# Patient Record
Sex: Female | Born: 1970
Health system: Southern US, Community
[De-identification: ages and names within clinical notes are randomized; demographics above are authoritative.]

## PROBLEM LIST (undated history)

## (undated) DIAGNOSIS — F4310 Post-traumatic stress disorder, unspecified: Secondary | ICD-10-CM

## (undated) DIAGNOSIS — E1110 Type 2 diabetes mellitus with ketoacidosis without coma: Secondary | ICD-10-CM

## (undated) DIAGNOSIS — E669 Obesity, unspecified: Secondary | ICD-10-CM

## (undated) DIAGNOSIS — O24919 Unspecified diabetes mellitus in pregnancy, unspecified trimester: Secondary | ICD-10-CM

## (undated) DIAGNOSIS — R51 Headache: Secondary | ICD-10-CM

## (undated) DIAGNOSIS — N643 Galactorrhea not associated with childbirth: Secondary | ICD-10-CM

## (undated) DIAGNOSIS — S90425A Blister (nonthermal), left lesser toe(s), initial encounter: Secondary | ICD-10-CM

## (undated) DIAGNOSIS — Z8619 Personal history of other infectious and parasitic diseases: Secondary | ICD-10-CM

## (undated) DIAGNOSIS — F329 Major depressive disorder, single episode, unspecified: Secondary | ICD-10-CM

## (undated) DIAGNOSIS — F319 Bipolar disorder, unspecified: Secondary | ICD-10-CM

## (undated) DIAGNOSIS — N926 Irregular menstruation, unspecified: Secondary | ICD-10-CM

## (undated) DIAGNOSIS — F32A Depression, unspecified: Secondary | ICD-10-CM

## (undated) DIAGNOSIS — M1990 Unspecified osteoarthritis, unspecified site: Secondary | ICD-10-CM

## (undated) DIAGNOSIS — B009 Herpesviral infection, unspecified: Secondary | ICD-10-CM

## (undated) DIAGNOSIS — Z87898 Personal history of other specified conditions: Secondary | ICD-10-CM

## (undated) DIAGNOSIS — IMO0002 Reserved for concepts with insufficient information to code with codable children: Secondary | ICD-10-CM

## (undated) DIAGNOSIS — B379 Candidiasis, unspecified: Secondary | ICD-10-CM

## (undated) DIAGNOSIS — Z950 Presence of cardiac pacemaker: Secondary | ICD-10-CM

## (undated) DIAGNOSIS — Z8742 Personal history of other diseases of the female genital tract: Secondary | ICD-10-CM

## (undated) DIAGNOSIS — N921 Excessive and frequent menstruation with irregular cycle: Secondary | ICD-10-CM

## (undated) DIAGNOSIS — Z87440 Personal history of urinary (tract) infections: Secondary | ICD-10-CM

## (undated) DIAGNOSIS — N915 Oligomenorrhea, unspecified: Secondary | ICD-10-CM

## (undated) DIAGNOSIS — A499 Bacterial infection, unspecified: Secondary | ICD-10-CM

## (undated) DIAGNOSIS — K219 Gastro-esophageal reflux disease without esophagitis: Secondary | ICD-10-CM

## (undated) DIAGNOSIS — A599 Trichomoniasis, unspecified: Secondary | ICD-10-CM

## (undated) DIAGNOSIS — D219 Benign neoplasm of connective and other soft tissue, unspecified: Secondary | ICD-10-CM

## (undated) DIAGNOSIS — R102 Pelvic and perineal pain: Secondary | ICD-10-CM

## (undated) DIAGNOSIS — N939 Abnormal uterine and vaginal bleeding, unspecified: Secondary | ICD-10-CM

## (undated) DIAGNOSIS — B373 Candidiasis of vulva and vagina: Secondary | ICD-10-CM

## (undated) DIAGNOSIS — R638 Other symptoms and signs concerning food and fluid intake: Secondary | ICD-10-CM

## (undated) DIAGNOSIS — T385X5A Adverse effect of other estrogens and progestogens, initial encounter: Secondary | ICD-10-CM

## (undated) DIAGNOSIS — I10 Essential (primary) hypertension: Secondary | ICD-10-CM

## (undated) HISTORY — DX: Herpesviral infection, unspecified: B00.9

## (undated) HISTORY — DX: Post-traumatic stress disorder, unspecified: F43.10

## (undated) HISTORY — DX: Candidiasis, unspecified: B37.9

## (undated) HISTORY — DX: Personal history of other specified conditions: Z87.898

## (undated) HISTORY — DX: Obesity, unspecified: E66.9

## (undated) HISTORY — DX: Trichomoniasis, unspecified: A59.9

## (undated) HISTORY — DX: Type 2 diabetes mellitus with ketoacidosis without coma: E11.10

## (undated) HISTORY — DX: Bacterial infection, unspecified: A49.9

## (undated) HISTORY — DX: Candidiasis of vulva and vagina: B37.3

## (undated) HISTORY — DX: Excessive and frequent menstruation with irregular cycle: N92.1

## (undated) HISTORY — DX: Oligomenorrhea, unspecified: N91.5

## (undated) HISTORY — DX: Personal history of other diseases of the female genital tract: Z87.42

## (undated) HISTORY — DX: Abnormal uterine and vaginal bleeding, unspecified: N93.9

## (undated) HISTORY — DX: Personal history of urinary (tract) infections: Z87.440

## (undated) HISTORY — DX: Personal history of other infectious and parasitic diseases: Z86.19

## (undated) HISTORY — DX: Pelvic and perineal pain: R10.2

## (undated) HISTORY — DX: Adverse effect of other estrogens and progestogens, initial encounter: T38.5X5A

## (undated) HISTORY — DX: Reserved for concepts with insufficient information to code with codable children: IMO0002

## (undated) HISTORY — DX: Other symptoms and signs concerning food and fluid intake: R63.8

## (undated) HISTORY — DX: Galactorrhea not associated with childbirth: N64.3

---
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
## 1996-12-06 DIAGNOSIS — IMO0002 Reserved for concepts with insufficient information to code with codable children: Secondary | ICD-10-CM

## 1996-12-06 DIAGNOSIS — R87619 Unspecified abnormal cytological findings in specimens from cervix uteri: Secondary | ICD-10-CM

## 1996-12-06 HISTORY — DX: Reserved for concepts with insufficient information to code with codable children: IMO0002

## 1996-12-06 HISTORY — DX: Unspecified abnormal cytological findings in specimens from cervix uteri: R87.619

## 2004-02-11 ENCOUNTER — Emergency Department (HOSPITAL_COMMUNITY): Admission: EM | Admit: 2004-02-11 | Discharge: 2004-02-12 | Payer: Self-pay

## 2004-06-26 ENCOUNTER — Encounter: Admission: RE | Admit: 2004-06-26 | Discharge: 2004-06-26 | Payer: Self-pay | Admitting: Family Medicine

## 2005-05-26 ENCOUNTER — Encounter: Admission: RE | Admit: 2005-05-26 | Discharge: 2005-08-24 | Payer: Self-pay | Admitting: Internal Medicine

## 2005-09-07 ENCOUNTER — Encounter (HOSPITAL_COMMUNITY): Admission: RE | Admit: 2005-09-07 | Discharge: 2005-10-07 | Payer: Self-pay | Admitting: Obstetrics

## 2005-10-12 ENCOUNTER — Encounter (HOSPITAL_COMMUNITY): Admission: RE | Admit: 2005-10-12 | Discharge: 2005-11-11 | Payer: Self-pay | Admitting: Obstetrics

## 2005-11-16 ENCOUNTER — Encounter (HOSPITAL_COMMUNITY): Admission: RE | Admit: 2005-11-16 | Discharge: 2005-11-23 | Payer: Self-pay | Admitting: Obstetrics

## 2005-11-19 ENCOUNTER — Inpatient Hospital Stay (HOSPITAL_COMMUNITY): Admission: AD | Admit: 2005-11-19 | Discharge: 2005-11-19 | Payer: Self-pay | Admitting: Obstetrics

## 2005-11-20 ENCOUNTER — Inpatient Hospital Stay (HOSPITAL_COMMUNITY): Admission: AD | Admit: 2005-11-20 | Discharge: 2005-11-20 | Payer: Self-pay | Admitting: Obstetrics

## 2005-11-28 ENCOUNTER — Inpatient Hospital Stay (HOSPITAL_COMMUNITY): Admission: AD | Admit: 2005-11-28 | Discharge: 2005-11-30 | Payer: Self-pay | Admitting: Obstetrics

## 2005-12-07 ENCOUNTER — Encounter (HOSPITAL_COMMUNITY): Admission: RE | Admit: 2005-12-07 | Discharge: 2005-12-07 | Payer: Self-pay | Admitting: Obstetrics

## 2005-12-10 ENCOUNTER — Inpatient Hospital Stay (HOSPITAL_COMMUNITY): Admission: AD | Admit: 2005-12-10 | Discharge: 2005-12-13 | Payer: Self-pay | Admitting: Obstetrics

## 2005-12-11 ENCOUNTER — Encounter (INDEPENDENT_AMBULATORY_CARE_PROVIDER_SITE_OTHER): Payer: Self-pay | Admitting: Specialist

## 2006-09-24 ENCOUNTER — Emergency Department (HOSPITAL_COMMUNITY): Admission: EM | Admit: 2006-09-24 | Discharge: 2006-09-24 | Payer: Self-pay | Admitting: Emergency Medicine

## 2006-12-06 DIAGNOSIS — N643 Galactorrhea not associated with childbirth: Secondary | ICD-10-CM

## 2006-12-06 HISTORY — DX: Galactorrhea not associated with childbirth: N64.3

## 2007-02-02 DIAGNOSIS — E119 Type 2 diabetes mellitus without complications: Secondary | ICD-10-CM | POA: Insufficient documentation

## 2007-02-02 DIAGNOSIS — I10 Essential (primary) hypertension: Secondary | ICD-10-CM | POA: Insufficient documentation

## 2007-02-02 DIAGNOSIS — E785 Hyperlipidemia, unspecified: Secondary | ICD-10-CM

## 2007-02-02 DIAGNOSIS — F319 Bipolar disorder, unspecified: Secondary | ICD-10-CM | POA: Insufficient documentation

## 2007-04-21 ENCOUNTER — Emergency Department (HOSPITAL_COMMUNITY): Admission: EM | Admit: 2007-04-21 | Discharge: 2007-04-21 | Payer: Self-pay | Admitting: Family Medicine

## 2007-04-28 ENCOUNTER — Emergency Department (HOSPITAL_COMMUNITY): Admission: EM | Admit: 2007-04-28 | Discharge: 2007-04-28 | Payer: Self-pay | Admitting: Emergency Medicine

## 2007-05-19 ENCOUNTER — Emergency Department (HOSPITAL_COMMUNITY): Admission: EM | Admit: 2007-05-19 | Discharge: 2007-05-19 | Payer: Self-pay | Admitting: Emergency Medicine

## 2007-07-07 DIAGNOSIS — Z8742 Personal history of other diseases of the female genital tract: Secondary | ICD-10-CM

## 2007-07-07 DIAGNOSIS — B3731 Acute candidiasis of vulva and vagina: Secondary | ICD-10-CM

## 2007-07-07 DIAGNOSIS — B373 Candidiasis of vulva and vagina: Secondary | ICD-10-CM

## 2007-07-07 DIAGNOSIS — N915 Oligomenorrhea, unspecified: Secondary | ICD-10-CM

## 2007-07-07 HISTORY — DX: Candidiasis of vulva and vagina: B37.3

## 2007-07-07 HISTORY — DX: Acute candidiasis of vulva and vagina: B37.31

## 2007-07-07 HISTORY — DX: Oligomenorrhea, unspecified: N91.5

## 2007-07-07 HISTORY — DX: Personal history of other diseases of the female genital tract: Z87.42

## 2007-07-13 ENCOUNTER — Encounter: Admission: RE | Admit: 2007-07-13 | Discharge: 2007-07-13 | Payer: Self-pay | Admitting: Obstetrics and Gynecology

## 2007-10-07 ENCOUNTER — Emergency Department (HOSPITAL_COMMUNITY): Admission: EM | Admit: 2007-10-07 | Discharge: 2007-10-07 | Payer: Self-pay | Admitting: Emergency Medicine

## 2007-12-07 DIAGNOSIS — Z87440 Personal history of urinary (tract) infections: Secondary | ICD-10-CM

## 2007-12-07 HISTORY — DX: Personal history of urinary (tract) infections: Z87.440

## 2008-03-18 ENCOUNTER — Encounter: Admission: RE | Admit: 2008-03-18 | Discharge: 2008-06-16 | Payer: Self-pay | Admitting: Internal Medicine

## 2008-07-03 ENCOUNTER — Emergency Department (HOSPITAL_COMMUNITY): Admission: EM | Admit: 2008-07-03 | Discharge: 2008-07-03 | Payer: Self-pay | Admitting: Emergency Medicine

## 2008-08-18 ENCOUNTER — Emergency Department (HOSPITAL_COMMUNITY): Admission: EM | Admit: 2008-08-18 | Discharge: 2008-08-18 | Payer: Self-pay | Admitting: Family Medicine

## 2008-10-21 ENCOUNTER — Encounter: Admission: RE | Admit: 2008-10-21 | Discharge: 2008-10-21 | Payer: Self-pay | Admitting: Internal Medicine

## 2008-12-06 DIAGNOSIS — Z8742 Personal history of other diseases of the female genital tract: Secondary | ICD-10-CM

## 2008-12-06 DIAGNOSIS — R638 Other symptoms and signs concerning food and fluid intake: Secondary | ICD-10-CM

## 2008-12-06 HISTORY — DX: Personal history of other diseases of the female genital tract: Z87.42

## 2008-12-06 HISTORY — DX: Other symptoms and signs concerning food and fluid intake: R63.8

## 2009-01-03 DIAGNOSIS — B009 Herpesviral infection, unspecified: Secondary | ICD-10-CM

## 2009-01-03 HISTORY — DX: Herpesviral infection, unspecified: B00.9

## 2009-01-14 ENCOUNTER — Observation Stay (HOSPITAL_COMMUNITY): Admission: AD | Admit: 2009-01-14 | Discharge: 2009-01-15 | Payer: Self-pay | Admitting: Obstetrics and Gynecology

## 2009-06-24 ENCOUNTER — Inpatient Hospital Stay (HOSPITAL_COMMUNITY): Admission: AD | Admit: 2009-06-24 | Discharge: 2009-06-26 | Payer: Self-pay | Admitting: Obstetrics and Gynecology

## 2009-06-30 ENCOUNTER — Inpatient Hospital Stay (HOSPITAL_COMMUNITY): Admission: AD | Admit: 2009-06-30 | Discharge: 2009-07-01 | Payer: Self-pay | Admitting: Obstetrics and Gynecology

## 2009-07-05 ENCOUNTER — Encounter (INDEPENDENT_AMBULATORY_CARE_PROVIDER_SITE_OTHER): Payer: Self-pay | Admitting: Obstetrics and Gynecology

## 2009-07-05 ENCOUNTER — Inpatient Hospital Stay (HOSPITAL_COMMUNITY): Admission: AD | Admit: 2009-07-05 | Discharge: 2009-07-07 | Payer: Self-pay | Admitting: Obstetrics and Gynecology

## 2010-02-10 ENCOUNTER — Encounter: Admission: RE | Admit: 2010-02-10 | Discharge: 2010-02-10 | Payer: Self-pay | Admitting: Obstetrics and Gynecology

## 2010-09-01 ENCOUNTER — Emergency Department (HOSPITAL_COMMUNITY)
Admission: EM | Admit: 2010-09-01 | Discharge: 2010-09-01 | Payer: Self-pay | Source: Home / Self Care | Admitting: Emergency Medicine

## 2010-11-20 ENCOUNTER — Inpatient Hospital Stay (HOSPITAL_COMMUNITY)
Admission: AD | Admit: 2010-11-20 | Discharge: 2010-11-24 | Payer: Self-pay | Source: Home / Self Care | Attending: Obstetrics and Gynecology | Admitting: Obstetrics and Gynecology

## 2010-12-06 NOTE — L&D Delivery Note (Signed)
Delivery Note At 1:31 PM a viable female was delivered via Vaginal, Spontaneous Delivery (Presentation: ; Occiput Anterior).  APGAR: 9, 9; weight 8 lb 9 oz (3885 g).  Right shoulder dystocia reduced with supra pubic pressure and Lysle Dingwall position.   Placenta status: Intact, Spontaneous.  Cord: 3 vessels with the following complications: .  Cord pH: not sent  Anesthesia: Local  Episiotomy: None Lacerations: second degree perineal Suture Repair: 2.0 chromic Est. Blood Loss (mL): 350cc  Mom to postpartum.  Baby to nursery-stable.  Baer Hinton A 07/21/2011, 2:10 PM

## 2011-01-05 NOTE — Discharge Summary (Signed)
Sarah, [name]                  ACCOUNT NO.:  [id number]  MEDICAL RECORD NO.:  [id number]          PATIENT TYPE:  INP  LOCATION:  9305                          [facility]  PHYSICIAN:  Sarah Long, M.D.DATE OF BIRTH:  1971/10/25  DATE OF ADMISSION:  11/20/2010 DATE OF DISCHARGE:  11/24/2010                              DISCHARGE SUMMARY   ADMITTING DIAGNOSES: 1. Intrauterine pregnancy in the first trimester. 2. Type 2 diabetes with poor control and poor compliance. 3. Chronic hypertension.  No current medications. 4. Poor obstetrical history.  DISCHARGE DIAGNOSES: 1. First trimester pregnancy. 2. Insulin-dependent diabetes in poor control. 3. Chronic hypertension.  No medication required.  PROCEDURES:  Diabetic teaching including nutritional education.  HOSPITAL COURSE:  Sarah Long is a 40 year old gravida 5, para 0-3-1-3 who presented on November 20, 2010, at 5:00 a.m. with complaint of feeling very bad with dizzy and headache.  Her circumstances had been remarkable for a positive urine pregnancy test earlier in the week.   History remarkable for: 1. Type 2 diabetes, poorly controlled secondary to poor compliance. 2. Chronic hypertension with the patient off medications since last     delivery with blood pressure 150/87 in the week of admission. 3. Bipolar, not on any medication. 4. History of MRSA remotely. 5. Previous C-section x1 with VBAC x2. 6. Elevated BMI. 7. Three previous preterm deliveries.  The patient's primary, Sarah Long, had recommended her to take 25 units of Levemir and to start blood pressure medication at the patient's visit earlier that week.  The patient had not done this.  Her most recent hemoglobin A1c had been 12.7, and the patient was not checking her blood sugars at home.  In maternity admissions, GC, Chlamydia, wet prep were done.  Urinalysis showed 500 of glucose.  CBC was within normal limits.  Her pelvic exam was  essentially normal.  Her comprehensive metabolic panel was normal except for a glucose level of 370.  Her quantitative HCG was 114.  It had previously been 97 on November 18, 2010.  The patient in light of her poor control of her diabetes was admitted to the third floor for consult with Sarah Long.  She was placed on glucose stabilizer, low-carb diet, and blood sugars were beginning to be monitored.  IV insulin was implemented.  Over the next several days, the patient's diet normalized to more carbohydrate control. However, she still continued to have blood sugars that were outside normal limits, and her insulin dosages were adjusted frequently. Recommendation was for her to go to Sarah Long for a possible insulin pump and also to be referred to the [facility] secondary to her bipolar disease that was not being treated.  By the 19th she was hoping to go home.  She reported she was really trying to keep her diet appropriate.  Her vital signs at that point were in the 140s-150s over 75s-90s.  Her fasting blood sugar and 2-hour postprandials were all still outside normal limits.  She has still remained on no blood pressure medicines.  GC, Chlamydia and MRSA cultures were negative. Nutrition consult was  obtained.  By December 20, the patient was apparently doing better with her diet.  However, her blood sugars were still outside normal limits, and she was insistant on going home that day.  Blood sugars were in the 200s, both fasting and 2-hour postprandial.  The decision was made, since the patient was insisting on going home, to discharge her.  Plan was made for her to follow up with Sarah Long office and discharge instructions were given for insulin regimen.  All of the patient's blood sugar were still not within normal limits, but again, in light of the patient's insisting on being discharged, she was discharged home in generally stable condition, although her blood sugars were  outside normal limits.  DISCHARGE INSTRUCTIONS:  The patient was to maintain a modified carbohydrate diet.  She was also to notice any issues with her pregnancy in light of bleeding or pain.  She was also recommended to follow up with the regimens that had been recommended for her insulin administration as well as her dietary management.  DISCHARGE MEDICATIONS:  18 units NPH in the morning and 18 units NPH at bedtime, 12 units Humalog with each meal.  She was to monitor her blood sugars with at least fasting and 2-hour PCs.  DISCHARGE FOLLOWUP:  Sarah Long office was called and they will call the patient to schedule an appointment on December 28 or December 08, 2010.  The patient was also instructed to return to our office to see Sarah Long and to call [facility] to get set up for her bipolar management.     Sarah Long, C.N.M.   ______________________________ Sarah Long, M.D.    VLL/MEDQ  D:  12/31/2010  T:  12/31/2010  Job:  [id number]  Electronically Signed by Nigel Bridgeman C.N.M. on 01/01/2011 02:24:13 AM Electronically Signed by Kirkland Hun M.D. on 01/05/2011 11:59:20 AM

## 2011-01-13 LAB — ANTIBODY SCREEN: Antibody Screen: NEGATIVE

## 2011-02-15 LAB — GLUCOSE, CAPILLARY
Glucose-Capillary: 105 mg/dL — ABNORMAL HIGH (ref 70–99)
Glucose-Capillary: 108 mg/dL — ABNORMAL HIGH (ref 70–99)
Glucose-Capillary: 118 mg/dL — ABNORMAL HIGH (ref 70–99)
Glucose-Capillary: 124 mg/dL — ABNORMAL HIGH (ref 70–99)
Glucose-Capillary: 135 mg/dL — ABNORMAL HIGH (ref 70–99)
Glucose-Capillary: 142 mg/dL — ABNORMAL HIGH (ref 70–99)
Glucose-Capillary: 180 mg/dL — ABNORMAL HIGH (ref 70–99)
Glucose-Capillary: 184 mg/dL — ABNORMAL HIGH (ref 70–99)
Glucose-Capillary: 186 mg/dL — ABNORMAL HIGH (ref 70–99)
Glucose-Capillary: 212 mg/dL — ABNORMAL HIGH (ref 70–99)
Glucose-Capillary: 225 mg/dL — ABNORMAL HIGH (ref 70–99)
Glucose-Capillary: 230 mg/dL — ABNORMAL HIGH (ref 70–99)
Glucose-Capillary: 234 mg/dL — ABNORMAL HIGH (ref 70–99)
Glucose-Capillary: 239 mg/dL — ABNORMAL HIGH (ref 70–99)
Glucose-Capillary: 248 mg/dL — ABNORMAL HIGH (ref 70–99)
Glucose-Capillary: 279 mg/dL — ABNORMAL HIGH (ref 70–99)
Glucose-Capillary: 279 mg/dL — ABNORMAL HIGH (ref 70–99)
Glucose-Capillary: 306 mg/dL — ABNORMAL HIGH (ref 70–99)
Glucose-Capillary: 94 mg/dL (ref 70–99)

## 2011-02-15 LAB — URINALYSIS, ROUTINE W REFLEX MICROSCOPIC
Bilirubin Urine: NEGATIVE
Ketones, ur: NEGATIVE mg/dL
Nitrite: NEGATIVE

## 2011-02-15 LAB — CBC
Hemoglobin: 12.9 g/dL (ref 12.0–15.0)
MCH: 29.7 pg (ref 26.0–34.0)
MCHC: 34.4 g/dL (ref 30.0–36.0)
RBC: 4.35 MIL/uL (ref 3.87–5.11)
RDW: 13.6 % (ref 11.5–15.5)

## 2011-02-15 LAB — COMPREHENSIVE METABOLIC PANEL
ALT: 27 U/L (ref 0–35)
Albumin: 3.7 g/dL (ref 3.5–5.2)
Alkaline Phosphatase: 64 U/L (ref 39–117)
BUN: 8 mg/dL (ref 6–23)
CO2: 22 mEq/L (ref 19–32)
Calcium: 9.5 mg/dL (ref 8.4–10.5)
GFR calc Af Amer: >60 mL/min (ref >60)
Potassium: 4 mEq/L (ref 3.5–5.1)

## 2011-02-15 LAB — WET PREP, GENITAL
Clue Cells Wet Prep HPF POC: NONE SEEN
Trich, Wet Prep: NONE SEEN

## 2011-02-15 LAB — HCG, QUANTITATIVE, PREGNANCY
hCG, Beta Chain, Quant, S: 144 m[IU]/mL — ABNORMAL HIGH (ref <5)
hCG, Beta Chain, Quant, S: 314 m[IU]/mL — ABNORMAL HIGH (ref <5)

## 2011-02-15 LAB — GC/CHLAMYDIA PROBE AMP, GENITAL: GC Probe Amp, Genital: NEGATIVE

## 2011-02-15 LAB — DIFFERENTIAL
Basophils Absolute: 0 10*3/uL (ref 0.0–0.1)
Eosinophils Absolute: 0.1 10*3/uL (ref 0.0–0.7)
Eosinophils Relative: 2 % (ref 0–5)
Neutro Abs: 4 10*3/uL (ref 1.7–7.7)

## 2011-02-15 LAB — LACTATE DEHYDROGENASE: LDH: 119 U/L (ref 94–250)

## 2011-02-15 LAB — URIC ACID: Uric Acid, Serum: 4.2 mg/dL (ref 2.4–7.0)

## 2011-02-18 LAB — POCT PREGNANCY, URINE: Preg Test, Ur: NEGATIVE

## 2011-02-25 ENCOUNTER — Other Ambulatory Visit (HOSPITAL_COMMUNITY): Payer: Self-pay | Admitting: Obstetrics and Gynecology

## 2011-02-25 DIAGNOSIS — O24919 Unspecified diabetes mellitus in pregnancy, unspecified trimester: Secondary | ICD-10-CM

## 2011-02-25 DIAGNOSIS — Z0489 Encounter for examination and observation for other specified reasons: Secondary | ICD-10-CM

## 2011-03-04 ENCOUNTER — Other Ambulatory Visit (HOSPITAL_COMMUNITY): Payer: Self-pay | Admitting: Obstetrics and Gynecology

## 2011-03-04 ENCOUNTER — Ambulatory Visit (HOSPITAL_COMMUNITY)
Admission: RE | Admit: 2011-03-04 | Discharge: 2011-03-04 | Disposition: A | Discharge status: Home / Self Care | Payer: Medicare Other | Source: Ambulatory Visit | Attending: Obstetrics and Gynecology | Admitting: Obstetrics and Gynecology

## 2011-03-04 DIAGNOSIS — O09529 Supervision of elderly multigravida, unspecified trimester: Secondary | ICD-10-CM

## 2011-03-04 DIAGNOSIS — R772 Abnormality of alphafetoprotein: Secondary | ICD-10-CM

## 2011-03-04 DIAGNOSIS — Z363 Encounter for antenatal screening for malformations: Secondary | ICD-10-CM | POA: Insufficient documentation

## 2011-03-04 DIAGNOSIS — O24919 Unspecified diabetes mellitus in pregnancy, unspecified trimester: Secondary | ICD-10-CM | POA: Insufficient documentation

## 2011-03-04 DIAGNOSIS — Z0489 Encounter for examination and observation for other specified reasons: Secondary | ICD-10-CM

## 2011-03-04 DIAGNOSIS — Z8751 Personal history of pre-term labor: Secondary | ICD-10-CM | POA: Insufficient documentation

## 2011-03-04 DIAGNOSIS — O34219 Maternal care for unspecified type scar from previous cesarean delivery: Secondary | ICD-10-CM | POA: Insufficient documentation

## 2011-03-04 DIAGNOSIS — Z1389 Encounter for screening for other disorder: Secondary | ICD-10-CM | POA: Insufficient documentation

## 2011-03-04 DIAGNOSIS — O358XX0 Maternal care for other (suspected) fetal abnormality and damage, not applicable or unspecified: Secondary | ICD-10-CM | POA: Insufficient documentation

## 2011-03-08 ENCOUNTER — Ambulatory Visit (HOSPITAL_COMMUNITY)
Admission: RE | Admit: 2011-03-08 | Discharge: 2011-03-08 | Disposition: A | Discharge status: Home / Self Care | Payer: Medicare Other | Source: Ambulatory Visit | Attending: Obstetrics and Gynecology | Admitting: Obstetrics and Gynecology

## 2011-03-08 ENCOUNTER — Other Ambulatory Visit (HOSPITAL_COMMUNITY): Payer: Self-pay | Admitting: Obstetrics and Gynecology

## 2011-03-08 DIAGNOSIS — O24919 Unspecified diabetes mellitus in pregnancy, unspecified trimester: Secondary | ICD-10-CM | POA: Insufficient documentation

## 2011-03-08 DIAGNOSIS — O09529 Supervision of elderly multigravida, unspecified trimester: Secondary | ICD-10-CM

## 2011-03-08 DIAGNOSIS — O34219 Maternal care for unspecified type scar from previous cesarean delivery: Secondary | ICD-10-CM | POA: Insufficient documentation

## 2011-03-08 DIAGNOSIS — R772 Abnormality of alphafetoprotein: Secondary | ICD-10-CM

## 2011-03-08 DIAGNOSIS — Z8751 Personal history of pre-term labor: Secondary | ICD-10-CM | POA: Insufficient documentation

## 2011-03-13 LAB — GLUCOSE, CAPILLARY
Glucose-Capillary: 117 mg/dL — ABNORMAL HIGH (ref 70–99)
Glucose-Capillary: 137 mg/dL — ABNORMAL HIGH (ref 70–99)
Glucose-Capillary: 144 mg/dL — ABNORMAL HIGH (ref 70–99)
Glucose-Capillary: 99 mg/dL (ref 70–99)

## 2011-03-13 LAB — CBC
MCHC: 34 g/dL (ref 30.0–36.0)
Platelets: 210 10*3/uL (ref 150–400)
RDW: 14.9 % (ref 11.5–15.5)

## 2011-03-14 LAB — GLUCOSE, CAPILLARY
Glucose-Capillary: 114 mg/dL — ABNORMAL HIGH (ref 70–99)
Glucose-Capillary: 121 mg/dL — ABNORMAL HIGH (ref 70–99)
Glucose-Capillary: 143 mg/dL — ABNORMAL HIGH (ref 70–99)
Glucose-Capillary: 147 mg/dL — ABNORMAL HIGH (ref 70–99)
Glucose-Capillary: 147 mg/dL — ABNORMAL HIGH (ref 70–99)
Glucose-Capillary: 154 mg/dL — ABNORMAL HIGH (ref 70–99)
Glucose-Capillary: 170 mg/dL — ABNORMAL HIGH (ref 70–99)
Glucose-Capillary: 170 mg/dL — ABNORMAL HIGH (ref 70–99)
Glucose-Capillary: 74 mg/dL (ref 70–99)
Glucose-Capillary: 81 mg/dL (ref 70–99)
Glucose-Capillary: 88 mg/dL (ref 70–99)
Glucose-Capillary: 92 mg/dL (ref 70–99)

## 2011-03-14 LAB — CBC
HCT: 31 % — ABNORMAL LOW (ref 36.0–46.0)
Hemoglobin: 10.6 g/dL — ABNORMAL LOW (ref 12.0–15.0)
MCHC: 34.2 g/dL (ref 30.0–36.0)
MCV: 88.2 fL (ref 78.0–100.0)
RBC: 3.52 MIL/uL — ABNORMAL LOW (ref 3.87–5.11)
RDW: 14.4 % (ref 11.5–15.5)

## 2011-03-14 LAB — STREP B DNA PROBE: Strep Group B Ag: NEGATIVE

## 2011-03-23 LAB — GLUCOSE, CAPILLARY
Glucose-Capillary: 158 mg/dL — ABNORMAL HIGH (ref 70–99)
Glucose-Capillary: 195 mg/dL — ABNORMAL HIGH (ref 70–99)
Glucose-Capillary: 236 mg/dL — ABNORMAL HIGH (ref 70–99)

## 2011-03-23 LAB — HEMOGLOBIN A1C: Mean Plasma Glucose: 229 mg/dL

## 2011-04-01 ENCOUNTER — Ambulatory Visit (HOSPITAL_COMMUNITY): Payer: Medicare Other

## 2011-04-09 ENCOUNTER — Other Ambulatory Visit (HOSPITAL_COMMUNITY): Payer: Self-pay | Admitting: Obstetrics and Gynecology

## 2011-04-09 ENCOUNTER — Ambulatory Visit (HOSPITAL_COMMUNITY)
Admission: RE | Admit: 2011-04-09 | Discharge: 2011-04-09 | Disposition: A | Discharge status: Home / Self Care | Payer: Medicare Other | Source: Ambulatory Visit | Attending: Obstetrics and Gynecology | Admitting: Obstetrics and Gynecology

## 2011-04-09 DIAGNOSIS — O24919 Unspecified diabetes mellitus in pregnancy, unspecified trimester: Secondary | ICD-10-CM | POA: Insufficient documentation

## 2011-04-09 DIAGNOSIS — Z8751 Personal history of pre-term labor: Secondary | ICD-10-CM | POA: Insufficient documentation

## 2011-04-09 DIAGNOSIS — R772 Abnormality of alphafetoprotein: Secondary | ICD-10-CM

## 2011-04-09 DIAGNOSIS — O09529 Supervision of elderly multigravida, unspecified trimester: Secondary | ICD-10-CM

## 2011-04-09 DIAGNOSIS — O34219 Maternal care for unspecified type scar from previous cesarean delivery: Secondary | ICD-10-CM | POA: Insufficient documentation

## 2011-04-18 ENCOUNTER — Inpatient Hospital Stay (HOSPITAL_COMMUNITY)
Admission: AD | Admit: 2011-04-18 | Discharge: 2011-04-18 | Disposition: A | Discharge status: Home / Self Care | Payer: Medicare Other | Source: Ambulatory Visit | Attending: Obstetrics and Gynecology | Admitting: Obstetrics and Gynecology

## 2011-04-18 DIAGNOSIS — O4690 Antepartum hemorrhage, unspecified, unspecified trimester: Secondary | ICD-10-CM | POA: Insufficient documentation

## 2011-04-18 LAB — URINE MICROSCOPIC-ADD ON

## 2011-04-18 LAB — WET PREP, GENITAL
Clue Cells Wet Prep HPF POC: NONE SEEN
Trich, Wet Prep: NONE SEEN
Yeast Wet Prep HPF POC: NONE SEEN

## 2011-04-18 LAB — URINALYSIS, ROUTINE W REFLEX MICROSCOPIC
Specific Gravity, Urine: >1.03 — ABNORMAL HIGH (ref 1.005–1.030)
Urobilinogen, UA: 1 mg/dL (ref 0.0–1.0)

## 2011-04-18 LAB — FETAL FIBRONECTIN: Fetal Fibronectin: NEGATIVE

## 2011-04-19 LAB — GC/CHLAMYDIA PROBE AMP, GENITAL: GC Probe Amp, Genital: NEGATIVE

## 2011-04-20 NOTE — Discharge Summary (Signed)
NAME:  Sarah Long, Sarah Long                  ACCOUNT NO.:  [id number]   MEDICAL RECORD NO.:  [id number]          PATIENT TYPE:  INP   LOCATION:  9308                          [facility]   PHYSICIAN:  Dois Davenport A. Long, M.D. DATE OF BIRTH:  1971/10/15   DATE OF ADMISSION:  01/14/2009  DATE OF DISCHARGE:  01/15/2009                               DISCHARGE SUMMARY   ADMITTING DIAGNOSES:  1. Intrauterine pregnancy at 10-2/7th weeks.  2. Poorly-controlled type 2 diabetes.  3. Chronic hypertension, the patient on Procardia.  4. Recent treatment for BV.  5. Bipolar.  6. History of previous cesarean section x1, previous vaginal birth      after cesarean.  7. History of preterm delivery x2.   DISCHARGE DIAGNOSES:  1. Single intrauterine pregnancy at 10 weeks 3 days.  2. Well-controlled chronic hypertension with Procardia 30 mg XL.  3. Poorly-controlled type 2 diabetes with elevated hemoglobin A1c at      9.6.   PROCEDURES:  Revision of insulin administration regimen.   HOSPITAL COURSE:  Sarah Long is a 40 year old gravida 4, para 0-2-1-2 at  10-2/7th weeks based upon ultrasound secondary to questionable LMP, who  presented on the evening of January 14, 2009, for blood sugar control  secondary to poorly-controlled diabetes.  The patient had her new OB in  the office on the same day of admission.  At that time, she advised she  had not been taking her Levemir twice a day insulin for approximately  one month, but she was afraid it would harm the baby.  She had been  continued to take her metformin 100 mg p.o. daily and had been  previously on Tegretol, trazodone, Benicar for blood pressure control.  The patient had been instructed to consult with her primary physicians  Dr. Lelon Perla and Dr. Hortencia Pilar for her medication issues.  At that time,  she was instructed to stop the Tegretol.  She would continue the  trazodone.  She also stopped the Benicar and was placed on Procardia 30  XL once p.o.  daily.   While the patient was at her new OB visit, her blood sugar was noted to  be approximately 336.  Dr. Su Hilt was consulted and a plan was made for  her to change to  34 units of NPH with a sliding scale of regular.  The  patient's prescriptions were called for the new insulin to her pharmacy,  however, they were going to be unable to delivery until today.  The  patient checked her blood sugar at home and it was 315 at 7:00 p.m.  In  light of the inability the patient to get her insulin and get it started  today, then she was admitted for better blood sugar control.   Her pregnancy remarkable for:  1. Chronic hypertension on Procardia 30 mg XL p.o. daily.  2. Diabetes mellitus diagnosed in 2004 with the patient on long-acting      insulin and metformin at home prior to pregnancy with poor control.  3. Bipolar.  The patient previously on Tegretol and trazodone, now  just on trazodone.  4. 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  5. History of MRSA.  6. Previous C-section x1 with subsequent vaginal delivery.  7. History of preterm delivery x2, one with the first baby at 26 weeks      with a C-section for fetal distress, and number 2 at 29-5/7th weeks      with a vaginal birth after cesarean and preterm labor.  8. TAB x1.  9. Recent clindamycin therapy for baby.   PHYSICAL EXAMINATION:  On admission, blood pressure was 137/82.  The  patient was having no pain.  She was afebrile.  Other vital signs were  stable.  Her CBG was 236.  Fetal heart tones were have been noted in the  office earlier that day.  Physical exam was within normal limits.  A  plan was made to admit her to third floor for 23-hour observation for  blood sugar control.  Plan was made to start 35 units of NPH in the  evening and 13 units of NPH in the morning with a sliding scale for  postprandial blood sugar greater than 120.  The patient had a hemoglobin  A1c of 9.6.  Through the night, the  patient had a blood sugar at 11:00  p.m. of 195, at 10:35 it had been 229, and when she had the 236 that she  got 12 units of regular insulin approximately 9:45 p.m.  Through the  night, she did well.  She had no complaints.  She received an additional  23 units of regular for a sliding scale.  Her blood pressure in the  morning was 116/72.  Fasting blood sugar was 157, a.c. breakfast 196,  and p.c. breakfast 158.  Dr. Estanislado Pandy saw the patient.  The patient was  instructed on her new blood sugar regimen and a plan was made for her to  follow up on Monday with Dr. Pennie Rushing.  The patient was deemed to receive  full benefit of her hospital stay and was discharged home.   DISCHARGE INSTRUCTIONS:  The patient is to take NPH 40 units in the  morning and 20 units at bedtime.  She is to be on Humulin regular  sliding scale.  Instructions for this have been given to the patient on  the day of her office visit and the patient had a copy of this.  The  patient is to check her CBG fasting blood sugar before and after meals.  Discharge medications as previously noted NPH 4 units, 40 units in the  morning, and 20 units at bedtime.  Humulin R for sliding scale.  The  patient will also continue her other medications as previously  prescribed by her primary medical doctor's.  Discharge followup will  occur with Dr. Pennie Rushing on January 20, 2009, at 11:45 a.m.  There has  been attending appointment scheduled with Dr. Talmage Nap as the  endocrinologist through [facility].  The patient also is  aware of the need to call should she have any questions about blood  sugar management or any other issues.      Renaldo Reel Sarah Long, C.N.M.      Sarah Long, M.D.  Electronically Signed    VLL/MEDQ  D:  01/15/2009  T:  01/16/2009  Job:  [id number]
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
## 2011-04-20 NOTE — Discharge Summary (Signed)
NAMEAIRYANA, SPRUNGER                  ACCOUNT NO.:  [id number]   MEDICAL RECORD NO.:  [id number]          PATIENT TYPE:  INP   LOCATION:  9157                          FACILITY:  [facility]   PHYSICIAN:  Janine Limbo, M.D.DATE OF BIRTH:  [birth year]   DATE OF ADMISSION:  06/24/2009  DATE OF DISCHARGE:  06/26/2009                               DISCHARGE SUMMARY   ADMITTING DIAGNOSES:  1. Intrauterine pregnancy at 38 and 2/7 weeks.  2. Preterm cervical change.  3. Type 2 diabetes, insulin-dependent.  4. Chronic hypertension.  5. History of herpes simplex virus II.   DISCHARGE DIAGNOSES:  1. Intrauterine pregnancy at 38 and 2/7 weeks.  2. Preterm cervical change.  3. Type 2 diabetes, insulin-dependent.  4. Chronic hypertension.  5. History of herpes simplex virus II.   PROCEDURE:  Betamethasone course.   HOSPITAL COURSE:  Ms. Tellez is a 40 year old gravida 4, para 0-2-1-2 at  75 and 2/7 weeks, who was admitted on the afternoon of June 24, 2009  from [facility] secondary to cervical exam of 2-3, 90%  at the office.  She reported some occasional contractions.  She had a  negative fetal fibronectin done that day at noon that was negative.  She  was admitted for observation of preterm labor and betamethasone course.   Her pregnancy had been remarkable for:  1. Previous preterm delivery x2, one at 26 weeks and one at 29 weeks.      The patient currently on 17P weekly.  2. Previous cesarean section with subsequent VBAC, plan for VBAC this      time.  3. Chronic hypertension, on Aldomet 250 mg p.o. b.i.d.  4. Type 2 diabetes poorly controlled secondary to the patient in      discretions.  The patient currently on insulin.  5. Bipolar, not on any medication at present.  6. Smoker.  7. History of HSV II.  8. History of MRSA on vulva 5/10 with a negative culture on 6/10.  9. SULFA allergy.  10.Advanced maternal age with normal first trimester screen, but amnio  declined.  11.Current bacterial vaginosis, currently on Flagyl.   On arrival, the patient's vital signs were stable.  Fetal heart rate was  reactive.  There were some questionable areas of irritability on the  uterine contraction monitor.  The patient was unaware of these cervix on  exam at arrival was 3, 90%, vertex -1, slight bulging bag of water, and  capillary blood glucose was 126.  The patient not had any significant  food intake except for one smoothie.  She was placed on her regular  insulin regimen plus a sliding scale.  She was started on betamethasone  course and Procardia 10 mg p.o. q.6 p.r.n. contractions was also  ordered.  The patient had a BPP and AFI showing an AFI of 22.41 cm,  vertex presentation, and BPP of 8/8.  She was continued on her Aldomet  and she was started on Valtrex initiation for prophylaxis.  Group B  strep culture testing was also done.  By day one of her  hospital stay,  she was doing well.  She was hoping to go home.  She had completed her  betamethasone course.  She did have 1 episode of contractions during the  night, but these did not require any treatment, weight was 244.  Fetal  heart remained reactive.  Late on the evening of June 25, 2009, the  patient completed her betamethasone course.  She also had a Nutrition  consult with recommendations made for better dietary management.  Dr.  Willeen Cass was notified about the patient's admission just for their  information.   On day two hospital stay June 26, 2009, the patient was doing well.  She  was having some sporadic pelvic pressure.  Fasting blood sugars were  elevated at 147, 2-hour PCs were 154-168, other vital signs were stable.  Fetal heart rate was reactive.  Cervical exam showed the cervix to be  loose 3 cm, 80%, vertex -1 to 0 station with slight bulging bag of water  and no significant contractions noted.  Dr. Stefano Gaul was consulted and  the decision was made to complete the 48-hour for  hospitalization since  her first dose of betamethasone and then allow the patient to go home.  The patient was agreeable with this plan.  While in the hospital, the  patient was on her routine regimen of NPH 45 units in the morning,  regular 10 units in the morning, regular 15 units at lunch, regular 25  units at dinner, NPH 80 units at bedtime approximately 10 p.m.  She then  was on sliding scale of 2 units increments with scale running from 120  greater than 239.   Upon the patient's discharge, plans were made for followup and to  continue her routine medication regimen.   DISCHARGE INSTRUCTIONS:  The patient is to maintain her carbohydrate  restricted diet.  She is strongly encouraged to follow the dietary plan  to ensure that her blood sugar is under the best control it can be.  There is awareness on her part of the impact of the betamethasone course  on her blood sugars.   DISCHARGE MEDICATIONS:  1. NPH 45 units in the morning and NPH 80 units at bedtime.  Regular      units 10 units at breakfast.  Regular insulin 15 units at lunch and      regular insulin 25 units at dinner.  2. She will continue folic acid 0.4 mg daily.  3. Prenatal vitamin one p.o. daily.  4. Flagyl 5 mg p.o. b.i.d. to complete her full 7-day course.  5. Aldomet 250 mg p.o. b.i.d.  6. Zyrtec one p.o. daily.  7. Vicodin one p.o. q.6 h. p.r.n. pelvic pain.  8. Flexeril 10 mg one p.o. q.6 h. p.r.n. pain.  9. Valtrex 500 mg p.o. daily.   Discharge follow up will occur on Monday with Dr. Willeen Cass for  Endocrinology followup.  The patient also has a NST and 17P injection on  Tuesday of next week as routinely scheduled.   DISCHARGE INSTRUCTIONS:  The patient will maintain a modified bedrest  regimen.  She will notify us with any increase signs and symptoms of  pelvic pressure vaginal bleeding, leaking of fluid, decreased fetal  movement, PIH symptoms, or any advancement of or awareness of preterm   labor.      [name], C.N.M.      Janine Limbo, M.D.  Electronically Signed    VLL/MEDQ  D:  06/26/2009  T:  06/27/2009  Job:  [id number]

## 2011-04-20 NOTE — Discharge Summary (Signed)
Sarah Long                  ACCOUNT NO.:  [id number]   MEDICAL RECORD NO.:  [id number]           PATIENT TYPE:   LOCATION:                                 FACILITY:   PHYSICIAN:  Hal Morales, M.D.DATE OF BIRTH:  August 02, 1971   DATE OF ADMISSION:  DATE OF DISCHARGE:                               DISCHARGE SUMMARY   ADMITTING DIAGNOSES:  1. Advanced maternal age.  2. Chronic hypertension.  3. Previous cesarean section with subsequent vaginal birth after      cesarean section.  4. Herpes simplex virus-2.  5. Smoker.  6. History of fibroids.  7. Bipolar disease.  8. Type II diabetes   DISCHARGE SUMMARY:  Intrauterine pregnancy; delivered at 34 weeks with  spontaneous rupture of membranes; chronic hypertension; advanced  maternal age; and Type II diabetes, on insulin.   Sarah Long is a 40 year old, single, black female, gravida 4, para 0-2-1-  2 at 33 and 6/7th weeks per [facility] of August 10, 2009, who presents with  chief complaint of leaking of fluids since 3 a.m.  She was admitted on  July 05, 2009, positive fetal movement, no contractions, denies PIH, she  has not had a fever.  She was checked in the office and she was followed  by the MD Service, has remarkable for premature delivery x2, previous C-  section, 1 successful VBAC, advanced maternal age, diabetes mellitus,  chronic hypertension, and history of fibroids.  On admission, her fetal  heart rate was 140, with reactive acecels, minimum to moderate  variability.  Sterile speculum exam was positive pooling and positive  ferning, no HSV lesions.  Cervix was 4, 80%, -2.  She was admitted to  Antenatal with Dr. Normand Sloop as attending, routine ADA diet, home meds,  insulin schedule; ultrasound for estimated fetal weight, AFI, and BPD.  Dr. Normand Sloop to discuss plan of care with this patient.  On July 05, 2009, at 11:16 a.m. she was admitted and the patient decided to have  VBAC.  The patient decided to try Pitocin, blood  pressure control with  Aldomet.  On July 05, 2009, at 8:25, the patient progressed to complete  and after pushing, delivered a viable female infant with Apgar 6 and 8  at 7:50.  Head delivered to perineum, mouth and nose both bulb  suctioned; shoulder, cord in compound presentation.  Baby delivered,  cord clamped, and handed to the awaiting pediatrician.  PH obtained, 1  superficial laceration was hemostatic.  The placenta was delivered.  Estimated blood loss was 300 mL.   HOSPITAL COURSE:  Social worker contact was made due to psychosocial  issues.  The day of discharge was July 07, 2009, at 9:10.  She was  pumping. She received Depo Provera for birth control.  At observation,  98.1, 90, 20, and blood pressure 130/77.  Hemoglobin was 10, hematocrit  35.3, and white blood cell count was 11.  Lungs are clear bilaterally.  Heart; regular rate without murmur.  Fundus was firm, 2 fingerbreadths  below the umbilicus, lochia rubra minimal, negative Homans.   ASSESSMENT:  Postpartum from 35 weeks preterm delivery  anemia, 10.7, asymptomatic  Type II diabetes  Chronic hypertension  Obesity  Status post vaginal birth after cesarean section  bipolar disorder   PLAN:  1. Discussed [employer].  2. Danger signs to call if temperature elevated, increased bleeding      reviewed.  3. To call [facility] for postpartum appointment.  4. Contacted patient's endocrinologist who will call her with followup      appt   MEDICATIONS:  She had a prescription for:  1. Ibuprofen 800 mg 1 every 6 hours p.r.n.  2. Percocet 5/325 mg 1 every 6 hours, #15 with no refills.  3. Integra F 1 every day for anemia.  4. Insulin per regimen from Dr. Talmage Nap, patient's endocrinologist  5. Aldomet      Sarah Long, CNM      Hal Morales, M.D.  Electronically Signed    JM/MEDQ  D:  07/07/2009  T:  07/08/2009  Job:  [id number]
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
## 2011-04-20 NOTE — H&P (Signed)
NAMESTEVANA, Sarah Long                  ACCOUNT NO.:  [id number]   MEDICAL RECORD NO.:  [id number]          PATIENT TYPE:  INP   LOCATION:  9308                          FACILITY:  [facility]   PHYSICIAN:  Osborn Coho, M.D.   DATE OF BIRTH:  November 23, 1971   DATE OF ADMISSION:  01/14/2009  DATE OF DISCHARGE:                              HISTORY & PHYSICAL   The patient is a 40 year old married black female, gravida 4, para 0-2-1-  2 at 10-2/7 weeks per an [facility] of August 10, 2009 who presents for  admission secondary to poorly controlled diabetes.  She had an unsure  LMP, and so she reports that she has had approximately 3 ultrasounds at  our office prior to today.  She was in our office earlier today for her  new OB workup and had elevated blood sugar.  She did go home and report  CBG of 315 at 1900.  She was seen by Nigel Bridgeman in the office today,  and some consultation was made between [facility] and Dr. Su Hilt for the  patient to come here this evening to further manage her diabetes.  She  reports stopping her metformin approximately 1 month ago and has had no  other diabetic treatment since.  She will be followed by the MD Service  at [facility].  History is remarkable for  1. Chronic hypertension, which she is on Procardia XL 30 mg p.o.      q.a.m.  2. Diabetes, which as I noted, she was previously on metformin.  3. Bipolar.  She does take trazodone 100 mg q.h.s., which she reports      helps her with sleep.  4. History of genital herpes.  5. History of MRSA.  6. Previous C-section x1, and that was with the pregnancy with her 15-      year-old daughter, and that was at [redacted] weeks gestation.  7. History of preterm delivery x2.  She had a 26-week delivery, and      the other living child is at 29-5/7 weeks delivery, but that one      was vaginal.  8. She has had a therapeutic abortion x1.  9. Recent diagnosis of bacterial vaginosis and has been on clindamycin      p.o. therapy for 5 days.  The  patient receives her medications      through mail order, but reports that she will not be able to obtain      her insulin, which she has ordered until tomorrow evening.  She      reports first trimester discomforts are improving such as nausea,      vomiting, and fatigue.  She denies any abdominal pain, vaginal      bleeding, UTI signs or symptoms, abnormal discharge, fever, cough,      shortness of breath, or other complaints.  She is without pain.   SOCIAL HISTORY:  She is a married black female.  Her husband is Fayrene Fearing.  He is here present with her.  She reports that her 67 year old daughter  is taking care of their younger  son, Lemar Livings who is 64 years old and reports  both of them are healthy and doing well.   FAMILY HISTORY:  There is some coronary artery disease as well as renal  failure.   PAST MEDICAL HISTORY:  Please see HPI.   ALLERGIES:  SULFA, which causes hives.   OBJECTIVE:  VITAL SIGNS:  On admission, blood pressure was 137/82, heart  rate 98, respirations 20, temperature was 98.6.  CBG here was 236.  Her  weight here is 226.  She is 5 feet 4 inches tall.  She does report  positive fetal heart tones in the office earlier today.  GENERAL:  No acute distress, alert and oriented x3 and pleasant.  CARDIOVASCULAR:  Regular rate and rhythm without murmur.  LUNGS:  Clear to auscultation bilaterally.  ABDOMEN:  Soft, nontender.  No rebound or guarding.  PELVIC:  Exam was deferred.  EXTREMITIES:  Without edema, no clonus, and DTRs are 1+.   IMPRESSION:  1. Single intrauterine pregnancy at 10-2/7 weeks.  2. Poorly controlled diabetes.  3. Chronic hypertension.  4. Recent diagnosis of bacterial vaginosis and on clindamycin.  5. Bipolar.  6. History of preterm delivery x2.   PLAN:  1. After consult with Dr. Su Hilt, planned admission to third floor      for 23-hour observation for blood sugar control.  2. She is to receive 35 units of NPH tonight following her       administration in MAU of 12 units of regular insulin after having      the CBG of 236.  She is going to receive 13 units of NPH in the      morning, and she has a sliding scale with regular insulin for any      postprandials that remained greater than 120, anticipating DC when      the patient able to obtain insulin and other medication resources      for home.  Also plan is to have the patient follow up in the office      as soon as possible to discuss possibility of a cerclage secondary      to her history of preterm delivery x2, and lastly MDs are to      follow.      Candice [place], [place]      Osborn Coho, M.D.  Electronically Signed    CHS/MEDQ  D:  01/14/2009  T:  01/15/2009  Job:  [id number]

## 2011-05-07 ENCOUNTER — Other Ambulatory Visit (HOSPITAL_COMMUNITY): Payer: Self-pay | Admitting: Obstetrics and Gynecology

## 2011-05-07 ENCOUNTER — Ambulatory Visit (HOSPITAL_COMMUNITY)
Admission: RE | Admit: 2011-05-07 | Discharge: 2011-05-07 | Disposition: A | Discharge status: Home / Self Care | Payer: Medicare Other | Source: Ambulatory Visit | Attending: Obstetrics and Gynecology | Admitting: Obstetrics and Gynecology

## 2011-05-07 DIAGNOSIS — O34219 Maternal care for unspecified type scar from previous cesarean delivery: Secondary | ICD-10-CM | POA: Insufficient documentation

## 2011-05-07 DIAGNOSIS — O09529 Supervision of elderly multigravida, unspecified trimester: Secondary | ICD-10-CM

## 2011-05-07 DIAGNOSIS — O24919 Unspecified diabetes mellitus in pregnancy, unspecified trimester: Secondary | ICD-10-CM

## 2011-05-07 DIAGNOSIS — Z8751 Personal history of pre-term labor: Secondary | ICD-10-CM

## 2011-05-07 DIAGNOSIS — R772 Abnormality of alphafetoprotein: Secondary | ICD-10-CM

## 2011-06-18 ENCOUNTER — Ambulatory Visit (HOSPITAL_COMMUNITY)
Admission: RE | Admit: 2011-06-18 | Discharge: 2011-06-18 | Disposition: A | Discharge status: Home / Self Care | Payer: Medicare Other | Source: Ambulatory Visit | Attending: Obstetrics and Gynecology | Admitting: Obstetrics and Gynecology

## 2011-06-18 ENCOUNTER — Encounter (HOSPITAL_COMMUNITY): Payer: Self-pay

## 2011-06-18 DIAGNOSIS — Z8751 Personal history of pre-term labor: Secondary | ICD-10-CM

## 2011-06-18 DIAGNOSIS — O09529 Supervision of elderly multigravida, unspecified trimester: Secondary | ICD-10-CM | POA: Insufficient documentation

## 2011-06-18 DIAGNOSIS — O34219 Maternal care for unspecified type scar from previous cesarean delivery: Secondary | ICD-10-CM | POA: Insufficient documentation

## 2011-06-18 DIAGNOSIS — O24919 Unspecified diabetes mellitus in pregnancy, unspecified trimester: Secondary | ICD-10-CM | POA: Insufficient documentation

## 2011-06-18 NOTE — ED Notes (Signed)
Pt being followed by her endocrinologist regarding her diabetes and reports her glucose values to him as well as her primary OB.  Does not recall her FBS this morning, but states her "sugars have been good".   Has her final 17P shot next week and denies any complaints.  Reports Braxton Hicks contractions and good fetal movement.

## 2011-06-18 NOTE — Progress Notes (Signed)
Report in AS/EPIC; follow-up as needed 

## 2011-06-21 ENCOUNTER — Encounter (HOSPITAL_COMMUNITY): Payer: Self-pay | Admitting: *Deleted

## 2011-06-21 ENCOUNTER — Inpatient Hospital Stay (HOSPITAL_COMMUNITY)
Admission: AD | Admit: 2011-06-21 | Discharge: 2011-06-21 | Disposition: A | Discharge status: Home / Self Care | Payer: Medicare Other | Source: Ambulatory Visit | Attending: Obstetrics and Gynecology | Admitting: Obstetrics and Gynecology

## 2011-06-21 DIAGNOSIS — O479 False labor, unspecified: Secondary | ICD-10-CM | POA: Insufficient documentation

## 2011-06-21 DIAGNOSIS — O4700 False labor before 37 completed weeks of gestation, unspecified trimester: Secondary | ICD-10-CM | POA: Diagnosis present

## 2011-06-21 HISTORY — DX: Benign neoplasm of connective and other soft tissue, unspecified: D21.9

## 2011-06-21 HISTORY — DX: Major depressive disorder, single episode, unspecified: F32.9

## 2011-06-21 HISTORY — DX: Depression, unspecified: F32.A

## 2011-06-21 HISTORY — DX: Essential (primary) hypertension: I10

## 2011-06-21 NOTE — Progress Notes (Signed)
Sarah Long is a 40 y.o. female presenting for c/o increased ctx that have been getting stronger and closer together. Denies LOF, VB and reports +FM History OB History    Grav Para Term Preterm Abortions TAB SAB Ect Mult Living   5 3 0 3 1 1 0 0 0 3      Past Medical History  Diagnosis Date  . Diabetes mellitus   . Fibroid   . Hypertension   . Preterm labor   . Depression    Past Surgical History  Procedure Date  . Cesarean section    Family History: family history is not on file. Social History:  reports that she has been smoking Cigarettes.  She has a 5 pack-year smoking history. She has never used smokeless tobacco. She reports that she does not drink alcohol or use illicit drugs.  ROS Pt has normal preg c/o otherwise normal  Dilation: 2 Effacement (%): 80 Station: -3 Exam by:: S/Lilliard,CNM Temperature 98.1 F (36.7 C), temperature source Oral, height 5\' 4"  (1.626 m), weight 110.678 kg (244 lb). Exam Physical Exam  WNL  FHR 140 - cat I UC's rare Prenatal labs: ABO, Rh:   Antibody:   Rubella:   RPR:    HBsAg:    HIV:    GBS:     Assessment/Plan: After observing for 2 hours, pt felt less ctx and was ready for d/c FHR reassuring Discharged home with [facility] and PTL precautions. Pt to F/U as scheduled in office next week.    Sarah Long M 06/21/2011, 11:45 PM

## 2011-07-14 ENCOUNTER — Inpatient Hospital Stay (HOSPITAL_COMMUNITY)
Admission: AD | Admit: 2011-07-14 | Discharge: 2011-07-14 | Disposition: A | Discharge status: Home / Self Care | Payer: Medicare Other | Source: Ambulatory Visit | Attending: Obstetrics and Gynecology | Admitting: Obstetrics and Gynecology

## 2011-07-14 ENCOUNTER — Encounter (HOSPITAL_COMMUNITY): Payer: Self-pay | Admitting: *Deleted

## 2011-07-14 DIAGNOSIS — O479 False labor, unspecified: Secondary | ICD-10-CM | POA: Insufficient documentation

## 2011-07-14 HISTORY — DX: Bipolar disorder, unspecified: F31.9

## 2011-07-14 HISTORY — DX: Unspecified diabetes mellitus in pregnancy, unspecified trimester: O24.919

## 2011-07-14 HISTORY — DX: Herpesviral infection, unspecified: B00.9

## 2011-07-14 LAB — URINALYSIS, ROUTINE W REFLEX MICROSCOPIC
Bilirubin Urine: NEGATIVE
Glucose, UA: NEGATIVE mg/dL
Hgb urine dipstick: NEGATIVE
Ketones, ur: 15 mg/dL — AB
Nitrite: NEGATIVE
Protein, ur: 30 mg/dL — AB
Specific Gravity, Urine: >1.03 — ABNORMAL HIGH (ref 1.005–1.030)
Urobilinogen, UA: 0.2 mg/dL (ref 0.0–1.0)
pH: 6 (ref 5.0–8.0)

## 2011-07-14 LAB — URINE MICROSCOPIC-ADD ON

## 2011-07-14 LAB — WET PREP, GENITAL
Trich, Wet Prep: NONE SEEN
Yeast Wet Prep HPF POC: NONE SEEN

## 2011-07-14 LAB — GLUCOSE, CAPILLARY: Glucose-Capillary: 108 mg/dL — ABNORMAL HIGH (ref 70–99)

## 2011-07-14 NOTE — ED Provider Notes (Signed)
Sarah Long is a 40 y.o. female presenting for Leaking fluid. Pt reports a small amount white discharge came out when she was at home. Denies leaking continues. Reports some ctx, but they aren't regular, denies VB and has +FM. Maternal Medical History:  Reason for admission: Reason for admission: rupture of membranes.  Contractions: Onset was 3-5 hours ago.   Frequency: irregular.   Perceived severity is mild.    Fetal activity: Perceived fetal activity is normal.   Last perceived fetal movement was within the past hour.    Prenatal complications: Hypertension and preterm labor.     OB History    Grav Para Term Preterm Abortions TAB SAB Ect Mult Living   5 3 0 3 1 1 0 0 0 3      Past Medical History  Diagnosis Date  . Diabetes mellitus   . Fibroid   . Hypertension   . Preterm labor   . Depression   . Diabetes in pregnancy   . Herpes   . Bipolar 1 disorder    Past Surgical History  Procedure Date  . Cesarean section    Family History: family history is not on file. Social History:  reports that she has been smoking Cigarettes.  She has a 5 pack-year smoking history. She has never used smokeless tobacco. She reports that she does not drink alcohol or use illicit drugs.  Review of Systems  All other systems reviewed and are negative.      Blood pressure 128/58, pulse 86, temperature 97.4 F (36.3 C), temperature source Oral, resp. rate 20, height 5\' 2"  (1.575 m), weight 114.488 kg (252 lb 6.4 oz). Maternal Exam:  Uterine Assessment: Contraction strength is mild.  Contraction frequency is irregular.   Abdomen: Patient reports no abdominal tenderness. Fetal presentation: vertex  Introitus: Normal vulva. Vagina is positive for vaginal discharge.    Fetal Exam Fetal Monitor Review: Mode: ultrasound.   Baseline rate: 145.  Variability: moderate (6-25 bpm).   Pattern: accelerations present and no decelerations.    Fetal State Assessment: Category I - tracings are  normal.     Physical Exam  Nursing note and vitals reviewed. Constitutional: She is oriented to person, place, and time. She appears well-developed and well-nourished.  GI: Soft.  Genitourinary: Uterus normal. Vaginal discharge found.  Neurological: She is alert and oriented to person, place, and time.  Psychiatric: She has a normal mood and affect. Her behavior is normal. Judgment and thought content normal.   SSE performed D/C is thin and milky colored, small amount.    Prenatal labs: ABO, Rh:   Antibody:   Rubella:   RPR:    HBsAg:    HIV:    GBS:     Results for orders placed during the hospital encounter of 07/14/11 (from the past 24 hour(s))  URINALYSIS, ROUTINE W REFLEX MICROSCOPIC     Status: Abnormal   Collection Time   07/14/11 10:00 PM      Component Value Range   Color, Urine YELLOW  YELLOW    Appearance CLEAR  CLEAR    Specific Gravity, Urine >1.030 (*) 1.005 - 1.030    pH 6.0  5.0 - 8.0    Glucose, UA NEGATIVE  NEGATIVE (mg/dL)   Hgb urine dipstick NEGATIVE  NEGATIVE    Bilirubin Urine NEGATIVE  NEGATIVE    Ketones, ur 15 (*) NEGATIVE (mg/dL)   Protein, ur 30 (*) NEGATIVE (mg/dL)   Urobilinogen, UA 0.2  0.0 - 1.0 (mg/dL)  Nitrite NEGATIVE  NEGATIVE    Leukocytes, UA TRACE (*) NEGATIVE   URINE MICROSCOPIC-ADD ON     Status: Abnormal   Collection Time   07/14/11 10:00 PM      Component Value Range   Squamous Epithelial / LPF FEW (*) RARE    WBC, UA 11-20  <3 (WBC/hpf)   RBC / HPF 0-2  <3 (RBC/hpf)   Bacteria, UA MANY (*) RARE    Urine-Other MUCOUS PRESENT    GLUCOSE, CAPILLARY     Status: Abnormal   Collection Time   07/14/11 10:23 PM      Component Value Range   Glucose-Capillary 108 (*) 70 - 99 (mg/dL)  AMNISURE RUPTURE OF MEMBRANE (ROM)     Status: Normal   Collection Time   07/14/11 10:39 PM      Component Value Range   Amnisure ROM NEGATIVE    WET PREP, GENITAL     Status: Abnormal   Collection Time   07/14/11 10:45 PM      Component Value Range    Yeast, Wet Prep NONE SEEN  NONE SEEN    Trich, Wet Prep NONE SEEN  NONE SEEN    Clue Cells, Wet Prep NONE SEEN  NONE SEEN    WBC, Wet Prep HPF POC FEW (*) NONE SEEN     Assessment/Plan:  VSS, CBG stable, NST reactive,  ROM ruled out, labor ruled out Cervix undetermined, unable to reach secondary to patients discomfort.    Pt has scheduled an NST and visit with Dr Normand Sloop tomorrow, pt would like to keep appt, but NST can be cancelled.    Elexius Minar M 07/14/2011, 11:17 PM

## 2011-07-15 ENCOUNTER — Other Ambulatory Visit (HOSPITAL_COMMUNITY): Payer: Self-pay | Admitting: Obstetrics and Gynecology

## 2011-07-15 ENCOUNTER — Ambulatory Visit (HOSPITAL_COMMUNITY)
Admission: RE | Admit: 2011-07-15 | Discharge: 2011-07-15 | Disposition: A | Discharge status: Home / Self Care | Payer: Medicare Other | Source: Ambulatory Visit | Attending: Obstetrics and Gynecology | Admitting: Obstetrics and Gynecology

## 2011-07-15 DIAGNOSIS — Z8751 Personal history of pre-term labor: Secondary | ICD-10-CM | POA: Insufficient documentation

## 2011-07-15 DIAGNOSIS — O34219 Maternal care for unspecified type scar from previous cesarean delivery: Secondary | ICD-10-CM | POA: Insufficient documentation

## 2011-07-15 DIAGNOSIS — O24919 Unspecified diabetes mellitus in pregnancy, unspecified trimester: Secondary | ICD-10-CM | POA: Insufficient documentation

## 2011-07-20 ENCOUNTER — Other Ambulatory Visit (HOSPITAL_COMMUNITY): Payer: Self-pay | Admitting: *Deleted

## 2011-07-21 ENCOUNTER — Inpatient Hospital Stay (HOSPITAL_COMMUNITY)
Admission: RE | Admit: 2011-07-21 | Discharge: 2011-07-23 | DRG: 774 | Disposition: A | Discharge status: Home / Self Care | Payer: Medicare Other | Source: Ambulatory Visit | Attending: Obstetrics and Gynecology | Admitting: Obstetrics and Gynecology

## 2011-07-21 ENCOUNTER — Other Ambulatory Visit: Payer: Self-pay | Admitting: Obstetrics and Gynecology

## 2011-07-21 DIAGNOSIS — O4700 False labor before 37 completed weeks of gestation, unspecified trimester: Secondary | ICD-10-CM

## 2011-07-21 DIAGNOSIS — O99892 Other specified diseases and conditions complicating childbirth: Secondary | ICD-10-CM | POA: Diagnosis present

## 2011-07-21 DIAGNOSIS — O09529 Supervision of elderly multigravida, unspecified trimester: Secondary | ICD-10-CM | POA: Diagnosis present

## 2011-07-21 DIAGNOSIS — E119 Type 2 diabetes mellitus without complications: Secondary | ICD-10-CM | POA: Diagnosis present

## 2011-07-21 DIAGNOSIS — O34219 Maternal care for unspecified type scar from previous cesarean delivery: Secondary | ICD-10-CM | POA: Diagnosis present

## 2011-07-21 DIAGNOSIS — O1002 Pre-existing essential hypertension complicating childbirth: Secondary | ICD-10-CM | POA: Diagnosis present

## 2011-07-21 DIAGNOSIS — Z22330 Carrier of Group B streptococcus: Secondary | ICD-10-CM

## 2011-07-21 DIAGNOSIS — O2432 Unspecified pre-existing diabetes mellitus in childbirth: Principal | ICD-10-CM | POA: Diagnosis present

## 2011-07-21 LAB — CBC
HCT: 33.6 % — ABNORMAL LOW (ref 36.0–46.0)
Hemoglobin: 11.4 g/dL — ABNORMAL LOW (ref 12.0–15.0)
Hemoglobin: 10.9 g/dL — ABNORMAL LOW (ref 12.0–15.0)
MCH: 30 pg (ref 26.0–34.0)
MCH: 29.8 pg (ref 26.0–34.0)
MCHC: 33.9 g/dL (ref 30.0–36.0)
MCV: 88.4 fL (ref 78.0–100.0)
MCV: 88.3 fL (ref 78.0–100.0)
RBC: 3.66 MIL/uL — ABNORMAL LOW (ref 3.87–5.11)
RDW: 14.4 % (ref 11.5–15.5)

## 2011-07-21 LAB — MRSA PCR SCREENING: MRSA by PCR: NEGATIVE

## 2011-07-21 LAB — GLUCOSE, CAPILLARY: Glucose-Capillary: 211 mg/dL — ABNORMAL HIGH (ref 70–99)

## 2011-07-21 MED ORDER — INSULIN REGULAR HUMAN 100 UNIT/ML IJ SOLN
10.0000 [IU] | Freq: Three times a day (TID) | INTRAMUSCULAR | Status: DC
  Administered 2011-07-21: 10 [IU] via SUBCUTANEOUS
  Filled 2011-07-21: qty 0.1

## 2011-07-21 MED ORDER — PRENATAL PLUS 27-1 MG PO TABS
1.0000 | ORAL_TABLET | Freq: Every day | ORAL | Status: DC
  Administered 2011-07-22 – 2011-07-23 (×2): 1 via ORAL
  Filled 2011-07-21 (×2): qty 1

## 2011-07-21 MED ORDER — BENZOCAINE-MENTHOL 20-0.5 % EX AERO
INHALATION_SPRAY | CUTANEOUS | Status: AC
  Administered 2011-07-21: 18:00:00
  Filled 2011-07-21: qty 56

## 2011-07-21 MED ORDER — TETANUS-DIPHTH-ACELL PERTUSSIS 5-2.5-18.5 LF-MCG/0.5 IM SUSP
0.5000 mL | Freq: Once | INTRAMUSCULAR | Status: AC
  Administered 2011-07-22: 0.5 mL via INTRAMUSCULAR
  Filled 2011-07-21: qty 0.5

## 2011-07-21 MED ORDER — DEXTROSE 50 % IV SOLN: 25.0000 mL | INTRAVENOUS | Status: DC | PRN

## 2011-07-21 MED ORDER — ZOLPIDEM TARTRATE 5 MG PO TABS
5.0000 mg | ORAL_TABLET | Freq: Every evening | ORAL | Status: DC | PRN
  Administered 2011-07-22 (×2): 5 mg via ORAL
  Filled 2011-07-21 (×2): qty 1

## 2011-07-21 MED ORDER — ZOLPIDEM TARTRATE 10 MG PO TABS: 10.0000 mg | ORAL_TABLET | Freq: Every evening | ORAL | Status: DC | PRN

## 2011-07-21 MED ORDER — DIBUCAINE 1 % RE OINT: 1.0000 "application " | TOPICAL_OINTMENT | RECTAL | Status: DC | PRN

## 2011-07-21 MED ORDER — DEXTROSE IN LACTATED RINGERS 5 % IV SOLN
INTRAVENOUS | Status: DC
  Administered 2011-07-21: via INTRAVENOUS

## 2011-07-21 MED ORDER — ONDANSETRON HCL 4 MG/2ML IJ SOLN: 4.0000 mg | Freq: Four times a day (QID) | INTRAMUSCULAR | Status: DC | PRN

## 2011-07-21 MED ORDER — PENICILLIN G POTASSIUM 5000000 UNITS IJ SOLR
2.5000 10*6.[IU] | INTRAVENOUS | Status: DC
  Filled 2011-07-21 (×5): qty 2.5

## 2011-07-21 MED ORDER — IBUPROFEN 600 MG PO TABS
600.0000 mg | ORAL_TABLET | Freq: Four times a day (QID) | ORAL | Status: DC | PRN
  Filled 2011-07-21: qty 1

## 2011-07-21 MED ORDER — ONDANSETRON HCL 4 MG/2ML IJ SOLN: 4.0000 mg | INTRAMUSCULAR | Status: DC | PRN

## 2011-07-21 MED ORDER — OXYCODONE-ACETAMINOPHEN 5-325 MG PO TABS
1.0000 | ORAL_TABLET | ORAL | Status: DC | PRN
  Administered 2011-07-21 – 2011-07-22 (×4): 1 via ORAL
  Filled 2011-07-21 (×4): qty 1

## 2011-07-21 MED ORDER — SODIUM CHLORIDE 0.9 % IV SOLN: 250.0000 mL | INTRAVENOUS | Status: DC

## 2011-07-21 MED ORDER — LIDOCAINE HCL (PF) 1 % IJ SOLN
30.0000 mL | INTRAMUSCULAR | Status: DC | PRN
  Filled 2011-07-21 (×2): qty 30

## 2011-07-21 MED ORDER — MEASLES, MUMPS & RUBELLA VAC ~~LOC~~ INJ: 0.5000 mL | INJECTION | Freq: Once | SUBCUTANEOUS | Status: DC

## 2011-07-21 MED ORDER — FLEET ENEMA 7-19 GM/118ML RE ENEM: 1.0000 | ENEMA | RECTAL | Status: DC | PRN

## 2011-07-21 MED ORDER — OXYCODONE-ACETAMINOPHEN 5-325 MG PO TABS: 2.0000 | ORAL_TABLET | ORAL | Status: DC | PRN

## 2011-07-21 MED ORDER — INSULIN NPH (HUMAN) (ISOPHANE) 100 UNIT/ML ~~LOC~~ SUSP
35.0000 [IU] | Freq: Every day | SUBCUTANEOUS | Status: DC
  Administered 2011-07-21: 35 [IU] via SUBCUTANEOUS
  Filled 2011-07-21: qty 10

## 2011-07-21 MED ORDER — SIMETHICONE 80 MG PO CHEW: 80.0000 mg | CHEWABLE_TABLET | ORAL | Status: DC | PRN

## 2011-07-21 MED ORDER — PROMETHAZINE HCL 25 MG/ML IJ SOLN: 12.5000 mg | Freq: Four times a day (QID) | INTRAMUSCULAR | Status: DC | PRN

## 2011-07-21 MED ORDER — LANOLIN HYDROUS EX OINT: TOPICAL_OINTMENT | CUTANEOUS | Status: DC | PRN

## 2011-07-21 MED ORDER — OXYTOCIN BOLUS FROM INFUSION
500.0000 mL | Freq: Once | INTRAVENOUS | Status: DC
  Filled 2011-07-21: qty 500

## 2011-07-21 MED ORDER — SODIUM CHLORIDE 0.9 % IJ SOLN
3.0000 mL | Freq: Two times a day (BID) | INTRAMUSCULAR | Status: DC
  Administered 2011-07-21 – 2011-07-22 (×2): 3 mL via INTRAVENOUS

## 2011-07-21 MED ORDER — IBUPROFEN 600 MG PO TABS
600.0000 mg | ORAL_TABLET | Freq: Four times a day (QID) | ORAL | Status: DC
  Administered 2011-07-21 – 2011-07-23 (×8): 600 mg via ORAL
  Filled 2011-07-21 (×7): qty 1

## 2011-07-21 MED ORDER — BENZOCAINE-MENTHOL 20-0.5 % EX AERO: 1.0000 "application " | INHALATION_SPRAY | CUTANEOUS | Status: DC | PRN

## 2011-07-21 MED ORDER — INSULIN ASPART 100 UNIT/ML ~~LOC~~ SOLN: 0.0000 [IU] | Freq: Three times a day (TID) | SUBCUTANEOUS | Status: DC

## 2011-07-21 MED ORDER — ONDANSETRON HCL 4 MG PO TABS: 4.0000 mg | ORAL_TABLET | ORAL | Status: DC | PRN

## 2011-07-21 MED ORDER — LACTATED RINGERS IV SOLN: 500.0000 mL | INTRAVENOUS | Status: DC | PRN

## 2011-07-21 MED ORDER — ACETAMINOPHEN 325 MG PO TABS: 650.0000 mg | ORAL_TABLET | ORAL | Status: DC | PRN

## 2011-07-21 MED ORDER — TERBUTALINE SULFATE 1 MG/ML IJ SOLN: 0.2500 mg | Freq: Once | INTRAMUSCULAR | Status: DC | PRN

## 2011-07-21 MED ORDER — SENNOSIDES-DOCUSATE SODIUM 8.6-50 MG PO TABS
2.0000 | ORAL_TABLET | Freq: Every day | ORAL | Status: DC
  Administered 2011-07-21 – 2011-07-22 (×2): 2 via ORAL

## 2011-07-21 MED ORDER — SODIUM CHLORIDE 0.9 % IJ SOLN: 3.0000 mL | INTRAMUSCULAR | Status: DC | PRN

## 2011-07-21 MED ORDER — OXYTOCIN 20 UNITS IN LACTATED RINGERS INFUSION - SIMPLE
1.0000 m[IU]/min | INTRAVENOUS | Status: DC
  Administered 2011-07-21: 20000 m[IU] via INTRAVENOUS
  Administered 2011-07-21: 2 m[IU]/min via INTRAVENOUS
  Filled 2011-07-21: qty 1000

## 2011-07-21 MED ORDER — INSULIN NPH (HUMAN) (ISOPHANE) 100 UNIT/ML ~~LOC~~ SUSP
35.0000 [IU] | Freq: Every day | SUBCUTANEOUS | Status: DC
  Filled 2011-07-21: qty 10

## 2011-07-21 MED ORDER — CITRIC ACID-SODIUM CITRATE 334-500 MG/5ML PO SOLN: 30.0000 mL | ORAL | Status: DC | PRN

## 2011-07-21 MED ORDER — BUTORPHANOL TARTRATE 2 MG/ML IJ SOLN
INTRAMUSCULAR | Status: AC
  Administered 2011-07-21: 1 mg via INTRAVENOUS
  Filled 2011-07-21: qty 1

## 2011-07-21 MED ORDER — INSULIN REGULAR HUMAN 100 UNIT/ML IJ SOLN
10.0000 [IU] | Freq: Once | INTRAMUSCULAR | Status: AC
  Administered 2011-07-21: 10 [IU] via SUBCUTANEOUS

## 2011-07-21 MED ORDER — SODIUM CHLORIDE 0.9 % IV SOLN
INTRAVENOUS | Status: DC
  Administered 2011-07-21: 0.2 [IU]/h via INTRAVENOUS
  Filled 2011-07-21: qty 1

## 2011-07-21 MED ORDER — DEXTROSE-NACL 5-0.45 % IV SOLN: INTRAVENOUS | Status: DC

## 2011-07-21 MED ORDER — PENICILLIN G POTASSIUM 5000000 UNITS IJ SOLR
5.0000 10*6.[IU] | Freq: Once | INTRAVENOUS | Status: DC
  Administered 2011-07-21: 5 10*6.[IU] via INTRAVENOUS
  Filled 2011-07-21: qty 5

## 2011-07-21 MED ORDER — WITCH HAZEL-GLYCERIN EX PADS: 1.0000 "application " | MEDICATED_PAD | CUTANEOUS | Status: DC | PRN

## 2011-07-21 MED ORDER — SODIUM CHLORIDE 0.45 % IV SOLN: INTRAVENOUS | Status: DC

## 2011-07-21 MED ORDER — OXYTOCIN 20 UNITS IN LACTATED RINGERS INFUSION - SIMPLE: 125.0000 mL/h | INTRAVENOUS | Status: AC

## 2011-07-21 MED ORDER — DEXTROSE IN LACTATED RINGERS 5 % IV SOLN
INTRAVENOUS | Status: DC
  Administered 2011-07-21: 125 mL/h via INTRAVENOUS

## 2011-07-21 MED ORDER — DIPHENHYDRAMINE HCL 25 MG PO CAPS: 25.0000 mg | ORAL_CAPSULE | Freq: Four times a day (QID) | ORAL | Status: DC | PRN

## 2011-07-21 NOTE — Progress Notes (Signed)
Sarah Long is a 40 y.o. [id number] at [redacted]w[redacted]d by LMP admitted for induction of labor due to Diabetes and Hypertension.  Subjective:  Patient without complaint.  No vaginal tingling or pain.  No evidence of HSV outbreak  Objective: BP 139/78  Pulse 78  Temp(Src) 97.7 F (36.5 C) (Oral)  Resp 20  Ht 5\' 4"  (1.626 m)  Wt 115.214 kg (254 lb)  BMI 43.60 kg/m2      FHT:  FHR: 130 bpm, variability: moderate,  accelerations:  Present,  decelerations:  Absent UC:   irregular, every5-10 minutes SVE:   Dilation: 3 Effacement (%): 70 Station: -2 Exam by:: Dr Normand Sloop Sterile Spec  No HSV lesions seen  Labs: Lab Results  Component Value Date   WBC 7.0 07/21/2011   HGB 11.4* 07/21/2011   HCT 33.6* 07/21/2011   MCV 88.4 07/21/2011   PLT 184 07/21/2011    Assessment / Plan: Induction for DM ans CHTN BS stable BP stable Anticipate VBAC PCN for GBS prophylaxis Labor: start pitocin Preeclampsia:  no signs or symptoms of toxicity Fetal Wellbeing:  Category I Pain Control:  Labor support without medications I/D:  n/a Anticipated MOD:  VBAC pt has already had two succeful attempts.  NoHSV lesions visualized  Sarah Long A 07/21/2011, 11:16 AM

## 2011-07-21 NOTE — H&P (Signed)
Sarah Long is a 40 y.o. female presenting for induction of labor due to type 2 diabetes, chronic hypertension stable without medications. Patient denies headache, visual symptoms, or epigastric pain. Her cervix has been 2 cm in the office.  Pregnancy has been remarkable for: #1 advanced maternal age with normal first trimester screening however elevated trisomy 18 risk noted on quadruple screen #2 type 2 diabetes with patient on insulin throughout pregnancy. #3 history of HSV 2, with no recent or current lesions #4 bipolar disease, on no current medications. #5 history of MRSA in last pregnancy #6 Elevated BMI #7 Positive group B strep #8 history of preterm delivery x3, with patient on 17 P. during her pregnancy #9 history of previous cesarean section with a subsequent VBAC x2  #10 Desires BTL--consent form signed 5/289/12 #11 Smoker   History Patient entered care at approximately 12 weeks. She had been hospitalized for blood sugar management in December and was found to be pregnant at that time. Dr. Cleon Gustin had been managing her insulin regimen; however, the patient had been noncompliant. She did desire 17 P. again this pregnancy, due to a history of 3 previous preterm deliveries. At the time of her first visit, she was already on large doses of insulin. She also has had a history of chronic hypertension, however, Aldomet had been discontinued prior to pregnancy. She had a 24-hour urine protein baseline just prior to pregnancy of 113 mg in 24 hours. Patient was desiring a vaginal birth after cesarean for this delivery.  She had a hx of MRSA in her last pregnancy.  Patient had a normal first trimester screen. Fastings at that time were 80-90.  Patient was maintained on a regimen of 55 units of NPH in the morning and 65 units of NPH at bedtime and 35 units of and of NovoLog with each meal and then a sliding scale. She was also on vitamin D supplementation for low vitamin D level. She had a first  trimester ultrasound showing an EDC of 07/28/2011.  17 P. was begun at 16 weeks. She had a quadruple screen done that showed an elevated trisomy 18 risk of 1 in 24; however, her first trimester screen was within normal limits. She was referred to MFM. She continued to have some reflux and vomiting into the early second trimester. At that time, she was not doing very well checking her blood sugars. MFM consult was completed, and she had a fetal echo that was done that was normal, but it was limited by body habitus. She had a repeat AFP done that showed normal findings. Her insulin regimens continued to vary during her pregnancy, PO2 lack of good compliance with her diet. She had an ultrasound at approximately 30 weeks showing normal growth. Hemoglobin A1c at that time was 6..  She had another ultrasound done at 32 weeks showing growth at the 69th percentile, with normal fluid.   She continued to smoke 3-10 cigarettes during her pregnancy. She was also started on Valtrex at 34 weeks. She was on Ambien as well, deep to severe insomnia. Ultrasound at 34 weeks showed an AFI of 21 and normal growth.   Blood sugars were in somewhat better control by that time.  She was desiring a tubal sterilization, and signed consent form on 512 sorry 05/04/2011. Beta strep culture was done at 36 weeks and was noted to be positive. Other cultures were negative. At 37 weeks estimated fetal weight was 7 lbs. 6 oz., and cervix was 2  cm 80% vertex -2.  A plan was made for induction at this time due to her diabetes.   OB History    Grav Para Term Preterm Abortions TAB SAB Ect Mult Living   5 3 0 3 1 1 0 0 0 3      Pregnancy history is remarkable for: First pregnancy in 1988 was a termination of pregnancy at 16 weeks Second pregnancy in 1991 was a primary low transverse cesarean section, for a female infant at 92 weeks. Infant weighed 1 lb. 15 oz.--she had preterm labor with that pregnancy. That surgery was done under general  anesthesia after 24 hours of labor.  The baby was born in [place]. Third pregnancy in 2007 was a vaginal birth after cesarean, for a female infant, weight 4 lbs. 9 oz., at 29-3/7 weeks. She was only in labor a half an hour. She had no anesthesia.  That child was born at [facility].  She had polyhydramnios during that pregnancy. She also had pregnancy-induced hypertension during her pregnancy. Fourth pregnancy in 2010  was a vaginal delivery after cesarean of a female infant weight 6 lbs. 1 oz. at 34-6/7 weeks. She was in labor 3 hours, she had no anesthesia. She was a type II diabetic by that time and was on insulin during the pregnancy. That child was in NICU for 6 days at [facility].  Patient had gestational diabetes in her 76 pregnancy then was treated for type II in her last pregnancy.   In 1991, she received 17 P. during her pregnancy.  Past Medical History  Diagnosis Date  . Diabetes mellitus   . Fibroid   . Hypertension   . Preterm labor   . Depression   . Diabetes in pregnancy   . Herpes   . Bipolar 1 disorder   Her medical history is also remarkable for atypical cells on her Pap in 2011, but negative high risk HPV. She has had a history of fibroids diagnosed in the past, but none seem to be present in this pregnancy. She also has a history of HSV-2 diagnosed in 2009. She reports the usual childhood illnesses.  Her bipolar disease was diagnosed at age 12. Her type 2 diabetes was diagnosed in 2002. She also has a history of an intestinal polyp in the past and frequent UTIs.  Past Surgical History  Procedure Date  . Cesarean section    Her surgical history is remarkable for a C-section in 1991.  Family History: HTN, diabetes, asthma, Kidney disease, stroke, parkinson's and alzheimers, PTSD, Depression and anxiety, sickle cell trait Social History:  reports that she has been smoking Cigarettes.  She has a 5 pack-year smoking history. She has never used smokeless tobacco. She  reports that she does not drink alcohol or use illicit drugs.  FOB is involved and supportive.  Patient is of the [facility] faith.  She is a SAHM.  FOB is unemployed.  Review of Systems  Constitutional: Negative.   HENT: Negative.   Eyes: Negative.   Respiratory: Negative.   Cardiovascular: Negative.   Gastrointestinal: Negative.   Genitourinary: Negative.   Musculoskeletal: Negative.   Skin: Negative.   Neurological: Negative.   Endo/Heme/Allergies: Negative.   Psychiatric/Behavioral: Negative.   All other systems reviewed and are negative.      Temperature 97.7 F (36.5 C), temperature source Oral, resp. rate 18, height 5\' 4"  (1.626 m), weight 115.214 kg (254 lb). Maternal Exam:  Uterine Assessment: Contraction strength is mild.  Contraction frequency  is rare.   Abdomen: Patient reports no abdominal tenderness. Surgical scars: low transverse.   Fundal height is 39-40 cm.   Estimated fetal weight is 7-8 lbs.   Fetal presentation: vertex  Introitus: Normal vulva. Normal vagina.    Physical Exam  Constitutional: She is oriented to person, place, and time. She appears well-developed and well-nourished.  HENT:  Head: Normocephalic.  Eyes: Pupils are equal, round, and reactive to light.  Neck: Normal range of motion.  Respiratory: Effort normal and breath sounds normal.  GI: Soft.  Genitourinary: Vagina normal and uterus normal.  Musculoskeletal: Normal range of motion. Edema: 1+edema noted.  Neurological: She is alert and oriented to person, place, and time.  Skin: Skin is warm and dry.  Psychiatric: She has a normal mood and affect.   No evidence of HSV lesions or prodrome  Cervix has been 2 cm, 80%, -1 on last exam in office. FHR reactive, no decelerations.   Irregular, mild U/Cs.  Prenatal labs: ABO, Rh:  B+ Antibody:  Neg Rubella:  Immune RPR:   NR HBsAg:   Neg HIV:   Neg GBS:   Pos Sickle cell negative. 1st trimester screen WNL Initial Quad screen showed  elevated Trisomy 18 risk, but repeat AFP WNL   Assessment/Plan: IUP at 39 weeks Previous C/S, with 2 subsequent VBACs--desires VBAC this pregnancy Type II diabetes, insulin-dependent +GBS +MRSA history Chronic HTN, no medications Bipolar Hx HSV II, no recent or current lesions Desires BTL--consent on chart  Plan: Admit to Birthing Suite per consult with Dr. Normand Sloop Routine MD orders Glucose Stabilizer implementation GBS Rx with PCN G per standard dosing MRSA precautions at present--will screen with nasal swab.  If negative, may be removed from precautions Declines epidural at present Plan pitocin induction per low-dose protocol. MDs will follow.    Claire Dolores L 07/21/2011, 9:05 AM

## 2011-07-21 NOTE — Progress Notes (Signed)
CHYNA KNEECE is a 40 y.o. [id number] at [redacted]w[redacted]d by LMP admitted for induction of labor due to Gestational diabetes and Hypertension.  Subjective: pt co of pain secondary to contractions   Objective: BP 162/84  Pulse 90  Temp(Src) 97.7 F (36.5 C) (Oral)  Resp 20  Ht 5\' 4"  (1.626 m)  Wt 115.214 kg (254 lb)  BMI 43.60 kg/m2    bs 99  FHT:  FHR: 130 bpm, variability: moderate,  accelerations:  Present,  decelerations:  Absent UC:   regular, every 3to 5 minutes SVE:   6/90/0  Labs: Lab Results  Component Value Date   WBC 7.0 07/21/2011   HGB 11.4* 07/21/2011   HCT 33.6* 07/21/2011   MCV 88.4 07/21/2011   PLT 184 07/21/2011    Assessment / Plan: Spontaneous labor, progressing normally  Labor: Normal labor continue pitocin Preeclampsia:  none Fetal Wellbeing:  Category I Pain Control:  stadol and phenrgan per pt request I/D:  n/a Anticipated MOD:  VBAC  Jovaughn Wojtaszek A 07/21/2011, 12:12 PM

## 2011-07-22 ENCOUNTER — Encounter (HOSPITAL_COMMUNITY): Payer: Self-pay | Admitting: Anesthesiology

## 2011-07-22 ENCOUNTER — Encounter (HOSPITAL_COMMUNITY): Payer: Self-pay

## 2011-07-22 LAB — GLUCOSE, CAPILLARY
Glucose-Capillary: 129 mg/dL — ABNORMAL HIGH (ref 70–99)
Glucose-Capillary: 215 mg/dL — ABNORMAL HIGH (ref 70–99)
Glucose-Capillary: 207 mg/dL — ABNORMAL HIGH (ref 70–99)
Glucose-Capillary: 121 mg/dL — ABNORMAL HIGH (ref 70–99)
Glucose-Capillary: 139 mg/dL — ABNORMAL HIGH (ref 70–99)

## 2011-07-22 MED ORDER — METOCLOPRAMIDE HCL 10 MG PO TABS
10.0000 mg | ORAL_TABLET | Freq: Once | ORAL | Status: AC
  Administered 2011-07-22: 10 mg via ORAL
  Filled 2011-07-22: qty 1

## 2011-07-22 MED ORDER — INSULIN NPH (HUMAN) (ISOPHANE) 100 UNIT/ML ~~LOC~~ SUSP
25.0000 [IU] | Freq: Every day | SUBCUTANEOUS | Status: DC
  Administered 2011-07-23: 25 [IU] via SUBCUTANEOUS

## 2011-07-22 MED ORDER — LACTATED RINGERS IV SOLN: INTRAVENOUS | Status: DC

## 2011-07-22 MED ORDER — INSULIN NPH (HUMAN) (ISOPHANE) 100 UNIT/ML ~~LOC~~ SUSP
25.0000 [IU] | Freq: Every day | SUBCUTANEOUS | Status: DC
  Administered 2011-07-22: 25 [IU] via SUBCUTANEOUS

## 2011-07-22 MED ORDER — INSULIN ASPART 100 UNIT/ML ~~LOC~~ SOLN
10.0000 [IU] | Freq: Three times a day (TID) | SUBCUTANEOUS | Status: DC
  Filled 2011-07-22: qty 3

## 2011-07-22 MED ORDER — FAMOTIDINE 20 MG PO TABS
40.0000 mg | ORAL_TABLET | Freq: Once | ORAL | Status: AC
  Administered 2011-07-22: 40 mg via ORAL
  Filled 2011-07-22 (×2): qty 1

## 2011-07-22 MED ORDER — INSULIN ASPART 100 UNIT/ML ~~LOC~~ SOLN
8.0000 [IU] | Freq: Once | SUBCUTANEOUS | Status: AC
  Administered 2011-07-22: 8 [IU] via SUBCUTANEOUS

## 2011-07-22 MED ORDER — INSULIN REGULAR HUMAN 100 UNIT/ML IJ SOLN
10.0000 [IU] | Freq: Three times a day (TID) | INTRAMUSCULAR | Status: DC
  Administered 2011-07-22: 10 [IU] via SUBCUTANEOUS

## 2011-07-22 MED ORDER — INSULIN REGULAR HUMAN 100 UNIT/ML IJ SOLN
10.0000 [IU] | Freq: Once | INTRAMUSCULAR | Status: AC
  Administered 2011-07-22: 10 [IU] via SUBCUTANEOUS
  Filled 2011-07-22: qty 0.1

## 2011-07-22 MED ORDER — INSULIN NPH (HUMAN) (ISOPHANE) 100 UNIT/ML ~~LOC~~ SUSP
35.0000 [IU] | Freq: Once | SUBCUTANEOUS | Status: AC
  Administered 2011-07-22: 35 [IU] via SUBCUTANEOUS

## 2011-07-22 MED ORDER — INSULIN REGULAR HUMAN 100 UNIT/ML IJ SOLN: 10.0000 [IU] | Freq: Three times a day (TID) | INTRAMUSCULAR | Status: DC

## 2011-07-22 NOTE — Anesthesia Preprocedure Evaluation (Deleted)
Anesthesia Evaluation  Name, MR# and DOB Patient awake  General Assessment Comment  Reviewed: Allergy & Precautions, H&P , NPO status , Patient's Chart, lab work & pertinent test results  Airway Mallampati: III TM Distance: >3 FB Neck ROM: full    Dental No notable dental hx.    Pulmonary  clear to auscultation  pulmonary exam normalPulmonary Exam Normal breath sounds clear to auscultation none    Cardiovascular regular Normal    Neuro/Psych   GI/Hepatic/Renal negative GI ROS, negative Liver ROS, and negative Renal ROS (+)       Endo/Other  Negative Endocrine ROS (+)      Abdominal   Musculoskeletal   Hematology negative hematology ROS (+)   Peds  Reproductive/Obstetrics negative OB ROS    Anesthesia Other Findings             Anesthesia Physical Anesthesia Plan  ASA: III  Anesthesia Plan: Epidural   Post-op Pain Management:    Induction:   Airway Management Planned:   Additional Equipment:   Intra-op Plan:   Post-operative Plan:   Informed Consent: I have reviewed the patients History and Physical, chart, labs and discussed the procedure including the risks, benefits and alternatives for the proposed anesthesia with the patient or authorized representative who has indicated his/her understanding and acceptance.   Dental Advisory Given  Plan Discussed with: Anesthesiologist  Anesthesia Plan Comments:         Anesthesia Quick Evaluation

## 2011-07-22 NOTE — Progress Notes (Signed)
Post Partum Day 1 Subjective: no complaints, tolerating PO and wants post partum tubal  Objective: Blood pressure 152/85, pulse 89, temperature 97.8 F (36.6 C), temperature source Oral, resp. rate 20, height 5\' 4"  (1.626 m), weight 254 lb (115.214 kg), SpO2 95.00%, unknown if currently breastfeeding.  Physical Exam:  General: alert, cooperative, no distress and morbidly obese Lochia: appropriate Uterine Fundus: body habitus compromises abdominal exam.  Nontender.  Definiitely below umbilicus. Incision: n/a DVT Evaluation: No evidence of DVT seen on physical exam.   Basename 07/21/11 1635 07/21/11 0815  HGB 10.9* 11.4*  HCT 32.3* 33.6*    Assessment/Plan: Contraception Patient wants post partum sterilization.  I discussed in detail the indications for that procedure being her desire for an elective sterilization..  I also discussed the risks for her particular history of central obesity and history of prior csection through a midline incision.  The general risks of anaesthesia (in her case either spinal or general), bleeding, infection and damage to adjacent organs were discussed.  The specific risks of tubal failure with subsequent pregnancy, and the risk of intra-abdominal adhesions from previous surgery precluding the completion of a post partum tubal were discussed.  The patient acknowleges awareness of other birth control options, as well as the option of delaying sterilization until a time when laparascopic approach can be done.  She will make her final decision today.  She is currently scheduled for 730 am on 07/23/11 for her tubal. 2)  Type II DM.  Patient did not receive am insulin secondary to upcoming surgery, so had a postprandial cbg of >200.  Will plan to give am NPH, and ac Novolog, then cover as needed with a sliding scale. 3) Anticipate DC home 8/17 or 8/18   LOS: 1 day   Bridey Brookover P 07/22/2011, 12:51 PM

## 2011-07-22 NOTE — Progress Notes (Signed)
Pt has decided she wants to delay her sterilization procedure until 6 weeks postpartum.  Will encourage other contraception until then, though she currently plans to practice abstinence.

## 2011-07-23 ENCOUNTER — Encounter (HOSPITAL_COMMUNITY)
Admission: RE | Disposition: A | Discharge status: Home / Self Care | Payer: Self-pay | Source: Ambulatory Visit | Attending: Obstetrics and Gynecology

## 2011-07-23 ENCOUNTER — Encounter (HOSPITAL_COMMUNITY): Payer: Self-pay

## 2011-07-23 ENCOUNTER — Encounter (HOSPITAL_COMMUNITY): Payer: Self-pay | Admitting: Anesthesiology

## 2011-07-23 LAB — GLUCOSE, CAPILLARY: Glucose-Capillary: 80 mg/dL (ref 70–99)

## 2011-07-23 SURGERY — LIGATION, FALLOPIAN TUBE, POSTPARTUM
Anesthesia: General | Site: Abdomen | Laterality: Bilateral | Wound class: Clean Contaminated

## 2011-07-23 MED ORDER — ROCURONIUM BROMIDE 50 MG/5ML IV SOLN
INTRAVENOUS | Status: AC
  Filled 2011-07-23: qty 1

## 2011-07-23 MED ORDER — SUCCINYLCHOLINE CHLORIDE 20 MG/ML IJ SOLN
INTRAMUSCULAR | Status: AC
  Filled 2011-07-23: qty 1

## 2011-07-23 MED ORDER — MIDAZOLAM HCL 2 MG/2ML IJ SOLN
INTRAMUSCULAR | Status: AC
  Filled 2011-07-23: qty 2

## 2011-07-23 MED ORDER — LIDOCAINE HCL (CARDIAC) 20 MG/ML IV SOLN
INTRAVENOUS | Status: AC
  Filled 2011-07-23: qty 5

## 2011-07-23 MED ORDER — FENTANYL CITRATE 0.05 MG/ML IJ SOLN
INTRAMUSCULAR | Status: AC
  Filled 2011-07-23: qty 5

## 2011-07-23 MED ORDER — MEDROXYPROGESTERONE ACETATE 400 MG/ML IM SUSP: 150.0000 mg | Freq: Once | INTRAMUSCULAR | Status: DC

## 2011-07-23 MED ORDER — BUTORPHANOL TARTRATE 2 MG/ML IJ SOLN
1.0000 mg | Freq: Once | INTRAMUSCULAR | Status: AC
  Administered 2011-07-21: 1 mg via INTRAVENOUS

## 2011-07-23 MED ORDER — ONDANSETRON HCL 4 MG/2ML IJ SOLN
INTRAMUSCULAR | Status: AC
  Filled 2011-07-23: qty 2

## 2011-07-23 MED ORDER — MEDROXYPROGESTERONE ACETATE 150 MG/ML IM SUSP
150.0000 mg | Freq: Once | INTRAMUSCULAR | Status: AC
  Administered 2011-07-23: 150 mg via INTRAMUSCULAR
  Filled 2011-07-23: qty 1

## 2011-07-23 MED ORDER — MEDROXYPROGESTERONE ACETATE 150 MG/ML IM SUSP: 150.0000 mg | INTRAMUSCULAR | Status: DC

## 2011-07-23 MED ORDER — PROPOFOL 10 MG/ML IV EMUL
INTRAVENOUS | Status: AC
  Filled 2011-07-23: qty 20

## 2011-07-23 SURGICAL SUPPLY — 23 items
BLADE HEX COATED 2.75 (ELECTRODE) IMPLANT
CHLORAPREP W/TINT 26ML (MISCELLANEOUS) ×1 IMPLANT
CLOTH BEACON ORANGE TIMEOUT ST (SAFETY) ×1 IMPLANT
CONTAINER PREFILL 10% NBF 15ML (MISCELLANEOUS) ×2 IMPLANT
DERMABOND ADVANCED (GAUZE/BANDAGES/DRESSINGS) IMPLANT
DRAPE UTILITY XL STRL (DRAPES) ×1 IMPLANT
DRSG COVADERM 4X8 (GAUZE/BANDAGES/DRESSINGS) IMPLANT
ELECT REM PT RETURN 9FT ADLT (ELECTROSURGICAL)
ELECTRODE REM PT RTRN 9FT ADLT (ELECTROSURGICAL) ×1 IMPLANT
GLOVE SURG SS PI 6.5 STRL IVOR (GLOVE) ×2 IMPLANT
GOWN PREVENTION PLUS LG XLONG (DISPOSABLE) ×2 IMPLANT
NEEDLE HYPO 22GX1.5 SAFETY (NEEDLE) ×1 IMPLANT
NS IRRIG 1000ML POUR BTL (IV SOLUTION) ×1 IMPLANT
PACK ABDOMINAL MINOR (CUSTOM PROCEDURE TRAY) ×1 IMPLANT
PENCIL BUTTON HOLSTER BLD 10FT (ELECTRODE) ×1 IMPLANT
SPONGE LAP 4X18 X RAY DECT (DISPOSABLE) IMPLANT
SUT CHROMIC 2 0 SH (SUTURE) ×1 IMPLANT
SUT VIC AB 0 CT2 27 (SUTURE) ×1 IMPLANT
SUT VIC AB 3-0 X1 27 (SUTURE) IMPLANT
SYR BULB IRRIGATION 50ML (SYRINGE) IMPLANT
SYR CONTROL 10ML LL (SYRINGE) ×1 IMPLANT
TOWEL OR 17X24 6PK STRL BLUE (TOWEL DISPOSABLE) ×2 IMPLANT
WATER STERILE IRR 1000ML POUR (IV SOLUTION) ×1 IMPLANT

## 2011-07-23 NOTE — Progress Notes (Signed)
Post Partum Day 2 Subjective: no complaints, up ad lib, voiding, tolerating PO and Pumping Breastmilk for Infant in NICU  Objective: Blood pressure 134/78, pulse 69, temperature 97.7 F (36.5 C), temperature source Oral, resp. rate 18, height 5\' 4"  (1.626 m), weight 115.214 kg (254 lb), SpO2 95.00%, unknown if currently breastfeeding.  Physical Exam:  General: alert, cooperative and morbidly obese Lochia: appropriate Uterine Fundus: firm Incision: n/a DVT Evaluation: No evidence of DVT seen on physical exam.   Basename 07/21/11 1635 07/21/11 0815  HGB 10.9* 11.4*  HCT 32.3* 33.6*    Assessment/Plan: Discharge home, Breastfeeding and Contraception Depo-Provera prior to D/C, pt plans for PPBTL in 6 wks   LOS: 2 days   Anabel Halon 07/23/2011, 8:48 AM

## 2011-07-23 NOTE — Consult Note (Signed)
PUMPING GOING WELL.  PATIENT CONTACTED WIC AND WILL BE ABLE TO PICK UP DEBP TODAY.  PUTTING BABY SKIN TO SKIN AN ENCOURAGED TO ASK ABOUT PUTTING BABY TO BREAST.  ENCOURAGED TO CALL LC OFFICE WITH QUESTIONS/CONCERNS.

## 2011-07-23 NOTE — Discharge Summary (Signed)
Obstetric Discharge Summary Reason for Admission: Induction of Labor for Type II DM and HTN Prenatal Procedures: NST and ultrasound Intrapartum Procedures: spontaneous vaginal delivery Postpartum Procedures: Adjustment of Insulin dosages Complications-Operative and Postpartum: none  Temp:  [97.7 F (36.5 C)-98 F (36.7 C)] 97.7 F (36.5 C) (08/17 0500) Pulse Rate:  [69-81] 69  (08/17 0500) Resp:  [18-20] 18  (08/17 0500) BP: (134-149)/(78-83) 134/78 mmHg (08/17 0500) Hemoglobin  Date Value Range Status  07/21/2011 10.9* 12.0-15.0 (g/dL) Final     HCT  Date Value Range Status  07/21/2011 32.3* 36.0-46.0 (%) Final    Hospital Course:  Admitted for IOL for Type II DM and HTN, Progressed to C/C/ and SVD LMI over 2nd degree lac per Dr. Normand Sloop. Mild Shoulder dystocia resolved with McRoberts. PP unremarkable. Pt desired PPBTL but was postponed until 6 wks d/t previous C/S and ML incision. Pumping Breastmilk for infant in NICU.   Discharge Diagnoses: Term Pregnancy-delivered  Discharge Information: Per [facility] Instructions,  Date: 07/23/2011 Activity: pelvic rest Diet: routine and Diabetic Diet Medications:  Depo-Provera prior to D/C Motrin 600 mg po q 6 hrs prn pain Insulin: NPH 25 u q am and pm and Regular Insulin 10 u with each meal  Medication List  As of 07/23/2011  8:50 AM   START taking these medications         medroxyPROGESTERone 150 MG/ML injection   Commonly known as: DEPO-PROVERA   Inject 1 mL (150 mg total) into the muscle every 3 (three) months.         CONTINUE taking these medications         * AMBIEN 10 MG tablet   Generic drug: zolpidem      * cetirizine 10 MG tablet   Commonly known as: ZYRTEC      * folic acid 1 MG tablet   Commonly known as: FOLVITE      prenatal vitamin w/FE, FA 27-1 MG Tabs      * valACYclovir 500 MG tablet   Commonly known as: VALTREX     * Notice: This list has 4 medication(s) that are the same as other medications  prescribed for you. Read the directions carefully, and ask your doctor or other care provider to review them with you.       ASK your doctor about these medications         * AMBIEN PO      * ZYRTEC ALLERGY PO      * FOLIC ACID PO      PRENATAL 1 PO      * VALTREX PO     * Notice: This list has 4 medication(s) that are the same as other medications prescribed for you. Read the directions carefully, and ask your doctor or other care provider to review them with you.        Where to get your medications    These are the prescriptions that you need to pick up. We sent them to a specific pharmacy, so you will need to go there to get them.   [facility] - [place], [place] - [address] [facility] [address]    [address] [place] [address]    Phone: [phone]    Hours: 24-hours        medroxyPROGESTERone 150 MG/ML injection           Condition: stable Instructions: refer to practice specific booklet and To follow up with DR.  Balen next week for insulin adjustment (appt will be made by office) Discharge to: home Follow-up Information    Follow up with [facility] in 4 weeks. (For surgery consult and to schedule PP BTL or  as needed if symptoms worsen)          Newborn Data: Live born  Information for the patient's newborn:  Sarah Long, Sarah Long [[id number]]  female ; APGAR , ; weight ;  Home with Infant remains in NICU at time of mother's discharge.  Sarah Long 07/23/2011, 8:50 AM

## 2011-07-27 ENCOUNTER — Encounter (HOSPITAL_COMMUNITY): Payer: Self-pay | Admitting: Obstetrics and Gynecology

## 2011-08-23 ENCOUNTER — Emergency Department (HOSPITAL_COMMUNITY): Payer: No Typology Code available for payment source

## 2011-08-23 ENCOUNTER — Encounter (HOSPITAL_COMMUNITY): Payer: Self-pay | Admitting: Radiology

## 2011-08-23 ENCOUNTER — Emergency Department (HOSPITAL_COMMUNITY)
Admission: EM | Admit: 2011-08-23 | Discharge: 2011-08-23 | Disposition: A | Discharge status: Home / Self Care | Payer: No Typology Code available for payment source | Attending: Emergency Medicine | Admitting: Emergency Medicine

## 2011-08-23 DIAGNOSIS — S335XXA Sprain of ligaments of lumbar spine, initial encounter: Secondary | ICD-10-CM | POA: Insufficient documentation

## 2011-08-23 DIAGNOSIS — Z794 Long term (current) use of insulin: Secondary | ICD-10-CM | POA: Insufficient documentation

## 2011-08-23 DIAGNOSIS — E119 Type 2 diabetes mellitus without complications: Secondary | ICD-10-CM | POA: Insufficient documentation

## 2011-08-23 DIAGNOSIS — F319 Bipolar disorder, unspecified: Secondary | ICD-10-CM | POA: Insufficient documentation

## 2011-08-23 DIAGNOSIS — I10 Essential (primary) hypertension: Secondary | ICD-10-CM | POA: Insufficient documentation

## 2011-08-23 DIAGNOSIS — M545 Low back pain, unspecified: Secondary | ICD-10-CM | POA: Insufficient documentation

## 2011-09-08 ENCOUNTER — Other Ambulatory Visit: Payer: Self-pay | Admitting: Obstetrics and Gynecology

## 2011-09-08 LAB — POCT URINALYSIS DIP (DEVICE)
Glucose, UA: NEGATIVE
Nitrite: NEGATIVE

## 2011-09-08 LAB — POCT PREGNANCY, URINE: Preg Test, Ur: NEGATIVE

## 2011-09-09 NOTE — Patient Instructions (Addendum)
   Your procedure is scheduled ZO:[id number] October 8th  Enter through the Main Entrance of [facility] at: 9am Pick up the phone at the desk and dial [phone] and inform us of your arrival  Please call this number if you have any problems the morning of surgery: [phone]  Remember: Do not eat food after midnight  Do not drink clear liquids after:midnight Take these medicines the morning of surgery with a SIP OF WATER: Aldomet, wellbutrin,   Do not wear jewelry, make-up, or FINGER nail polish Do not wear lotions, powders, or perfumes.  Do not shave 48 hours prior to surgery. Do not bring valuables to the hospital.  Patients discharged on the day of surgery will not be allowed to drive home.      Remember to use your hibiclens as instructed.Please shower with 1/2 bottle the evening before your surgery and the other 1/2 bottle the morning of surgery.

## 2011-09-10 ENCOUNTER — Encounter (HOSPITAL_COMMUNITY): Payer: Self-pay

## 2011-09-10 ENCOUNTER — Encounter (HOSPITAL_COMMUNITY)
Admission: RE | Admit: 2011-09-10 | Discharge: 2011-09-10 | Disposition: A | Discharge status: Home / Self Care | Payer: Medicare Other | Source: Ambulatory Visit | Attending: Obstetrics and Gynecology | Admitting: Obstetrics and Gynecology

## 2011-09-10 ENCOUNTER — Other Ambulatory Visit: Payer: Self-pay

## 2011-09-10 DIAGNOSIS — Z01818 Encounter for other preprocedural examination: Secondary | ICD-10-CM | POA: Insufficient documentation

## 2011-09-10 DIAGNOSIS — Z01812 Encounter for preprocedural laboratory examination: Secondary | ICD-10-CM | POA: Insufficient documentation

## 2011-09-10 HISTORY — DX: Headache: R51

## 2011-09-10 LAB — CBC
Hemoglobin: 13.2 g/dL (ref 12.0–15.0)
RBC: 4.42 MIL/uL (ref 3.87–5.11)
WBC: 7.2 10*3/uL (ref 4.0–10.5)

## 2011-09-10 LAB — SURGICAL PCR SCREEN: Staphylococcus aureus: NEGATIVE

## 2011-09-10 LAB — BASIC METABOLIC PANEL
CO2: 22 mEq/L (ref 19–32)
Chloride: 98 mEq/L (ref 96–112)
GFR calc non Af Amer: >90 mL/min (ref >90)
Glucose, Bld: 402 mg/dL — ABNORMAL HIGH (ref 70–99)
Potassium: 3.9 mEq/L (ref 3.5–5.1)
Sodium: 133 mEq/L — ABNORMAL LOW (ref 135–145)

## 2011-09-10 MED ORDER — METOCLOPRAMIDE HCL 10 MG PO TABS: 10.0000 mg | ORAL_TABLET | Freq: Once | ORAL | Status: DC

## 2011-09-10 MED ORDER — FAMOTIDINE 20 MG PO TABS: 40.0000 mg | ORAL_TABLET | Freq: Once | ORAL | Status: DC

## 2011-09-10 NOTE — Pre-Procedure Instructions (Signed)
Dr Sheral Apley reviewed EKG, aware of b/p 142/100 at pat visit-to take aldomet over weekend and morning of surgery-pt instructed. Dr Jean Rosenthal reviewed Insulin usage-take 1/2 dose of Nov N evening before-(10 units) and hold morning of-pt instructed-also instructed pt to call or come in early if feels necessary per Dr Jean Rosenthal LSD ok'd

## 2011-09-10 NOTE — Pre-Procedure Instructions (Addendum)
Glucose on bmet returned at [phone] to Dr Normand Sloop at [facility] to return call-per Dr Abram Sander must have blood sugar managed prior to surgery Monday. Dr Willeen Cass office closed at 12noon today-unable to find lastest HgbA1c result. Per request of Dr Dillard-called pt and notified her of elevated glucose and if remains elevated-surgery will be cancelled dos.

## 2011-09-10 NOTE — Pre-Procedure Instructions (Signed)
Pt sees Dr Balan-endocrinologist for diabetes control-uses Insulin-Novolin Regular with each meal, Novolin N 20Units in am and Novolin N 20Units in pm. FBS generally runs 90 in am and 140-150 2 hours post meals. Dr Talmage Nap is managing b/p-on Aldomet 250mg  daily-has not taken today-b/p at pat visit-142/100

## 2011-09-13 ENCOUNTER — Encounter (HOSPITAL_COMMUNITY): Payer: Self-pay | Admitting: Anesthesiology

## 2011-09-13 ENCOUNTER — Ambulatory Visit (HOSPITAL_COMMUNITY)
Admission: RE | Admit: 2011-09-13 | Payer: Medicare Other | Source: Ambulatory Visit | Admitting: Obstetrics and Gynecology

## 2011-09-13 ENCOUNTER — Encounter (HOSPITAL_COMMUNITY): Admission: RE | Payer: Self-pay | Source: Ambulatory Visit

## 2011-09-23 LAB — POCT PREGNANCY, URINE: Preg Test, Ur: NEGATIVE

## 2011-10-05 ENCOUNTER — Emergency Department (HOSPITAL_COMMUNITY): Payer: Medicare Other

## 2011-10-05 ENCOUNTER — Emergency Department (HOSPITAL_COMMUNITY)
Admission: EM | Admit: 2011-10-05 | Discharge: 2011-10-05 | Disposition: A | Discharge status: Home / Self Care | Payer: Medicare Other | Attending: Emergency Medicine | Admitting: Emergency Medicine

## 2011-10-05 DIAGNOSIS — S022XXA Fracture of nasal bones, initial encounter for closed fracture: Secondary | ICD-10-CM | POA: Insufficient documentation

## 2011-10-05 DIAGNOSIS — M542 Cervicalgia: Secondary | ICD-10-CM | POA: Insufficient documentation

## 2011-10-05 DIAGNOSIS — Z79899 Other long term (current) drug therapy: Secondary | ICD-10-CM | POA: Insufficient documentation

## 2011-10-05 DIAGNOSIS — F319 Bipolar disorder, unspecified: Secondary | ICD-10-CM | POA: Insufficient documentation

## 2011-10-05 DIAGNOSIS — I10 Essential (primary) hypertension: Secondary | ICD-10-CM | POA: Insufficient documentation

## 2011-10-05 DIAGNOSIS — R51 Headache: Secondary | ICD-10-CM | POA: Insufficient documentation

## 2011-10-05 DIAGNOSIS — E119 Type 2 diabetes mellitus without complications: Secondary | ICD-10-CM | POA: Insufficient documentation

## 2011-10-05 DIAGNOSIS — H538 Other visual disturbances: Secondary | ICD-10-CM | POA: Insufficient documentation

## 2011-10-05 DIAGNOSIS — Z794 Long term (current) use of insulin: Secondary | ICD-10-CM | POA: Insufficient documentation

## 2011-10-12 DIAGNOSIS — T385X5A Adverse effect of other estrogens and progestogens, initial encounter: Secondary | ICD-10-CM

## 2011-10-12 DIAGNOSIS — N921 Excessive and frequent menstruation with irregular cycle: Secondary | ICD-10-CM

## 2011-10-12 HISTORY — DX: Excessive and frequent menstruation with irregular cycle: T38.5X5A

## 2011-10-12 HISTORY — DX: Excessive and frequent menstruation with irregular cycle: N92.1

## 2011-10-14 DIAGNOSIS — Z8742 Personal history of other diseases of the female genital tract: Secondary | ICD-10-CM

## 2011-10-14 DIAGNOSIS — Z87898 Personal history of other specified conditions: Secondary | ICD-10-CM

## 2011-10-14 DIAGNOSIS — E669 Obesity, unspecified: Secondary | ICD-10-CM

## 2011-10-14 HISTORY — DX: Personal history of other diseases of the female genital tract: Z87.42

## 2011-10-14 HISTORY — DX: Obesity, unspecified: E66.9

## 2011-10-14 HISTORY — DX: Personal history of other specified conditions: Z87.898

## 2011-12-14 IMAGING — CR DG ABDOMEN 1V
2 series · 2 of 2 positions shown · non-contrast
Comparison: 10/21/2008.

CLINICAL DATA: Pelvic pain.  Evaluate for intrauterine device.

ABDOMEN - 1 VIEW

[view not recorded (1 of 2)]
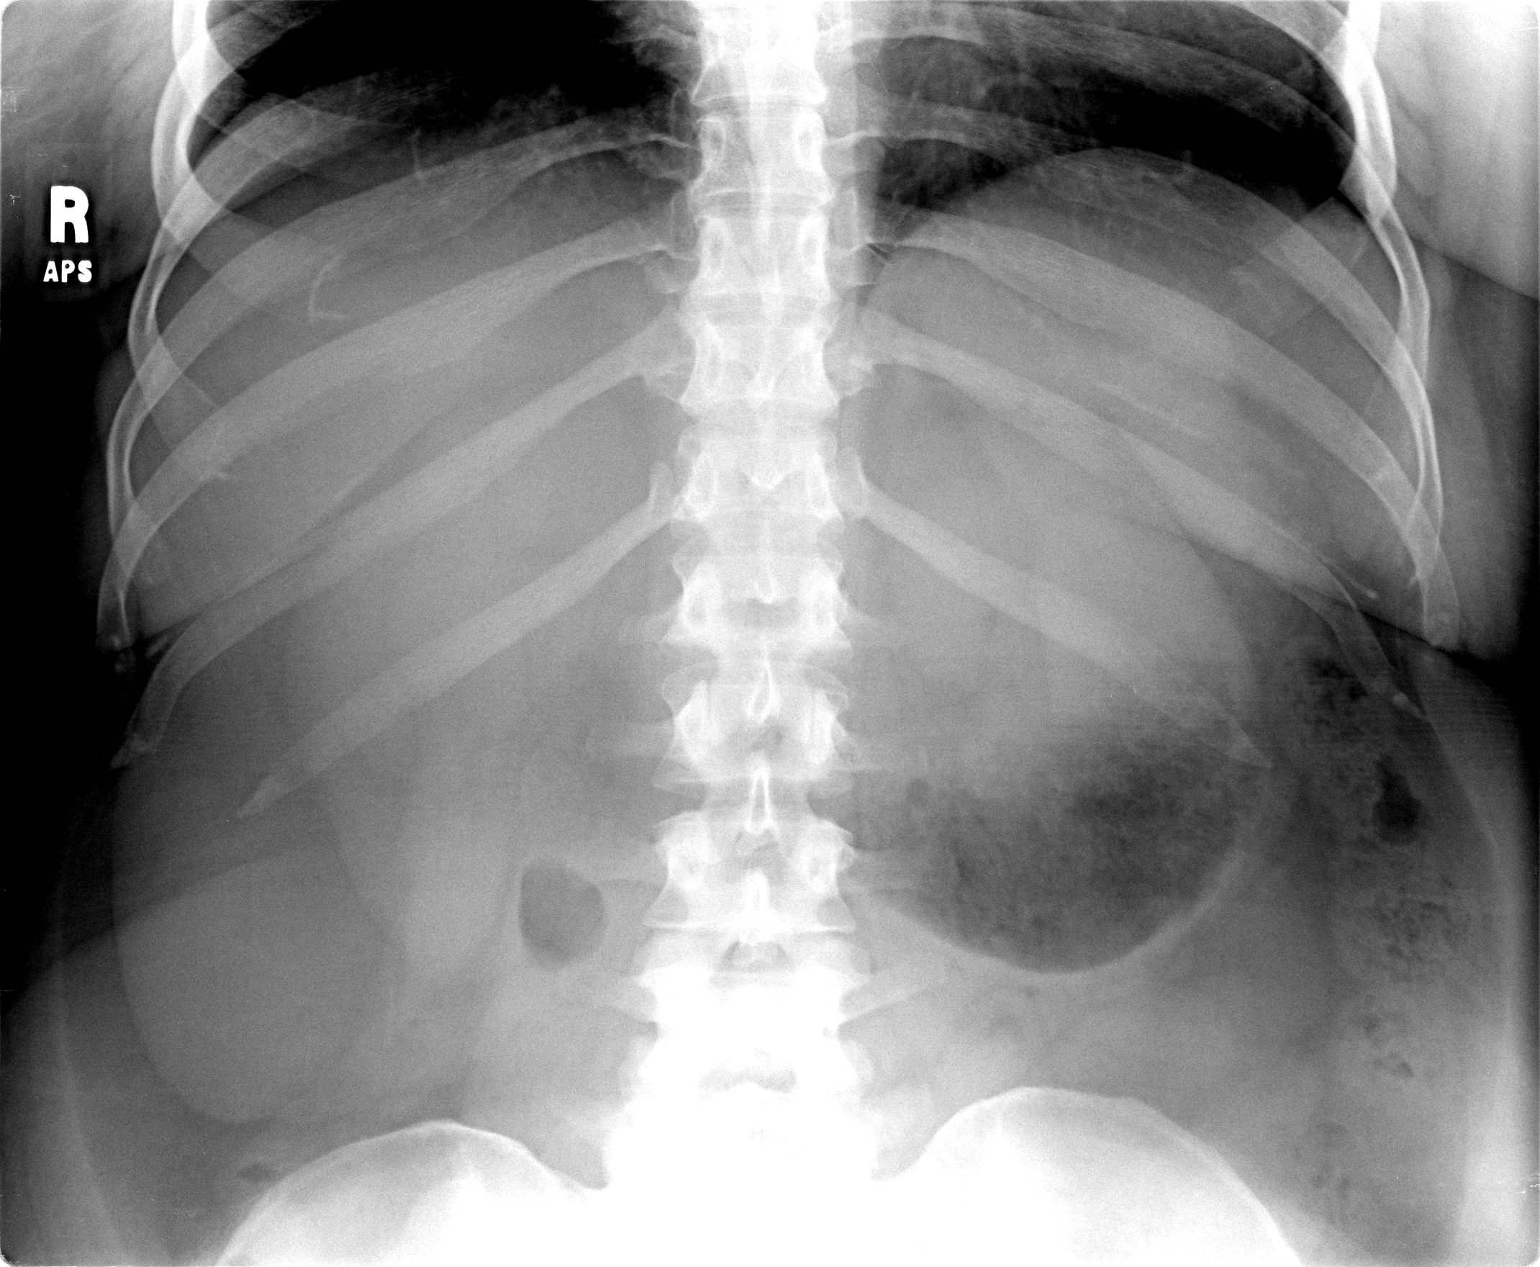

[view not recorded (2 of 2)]
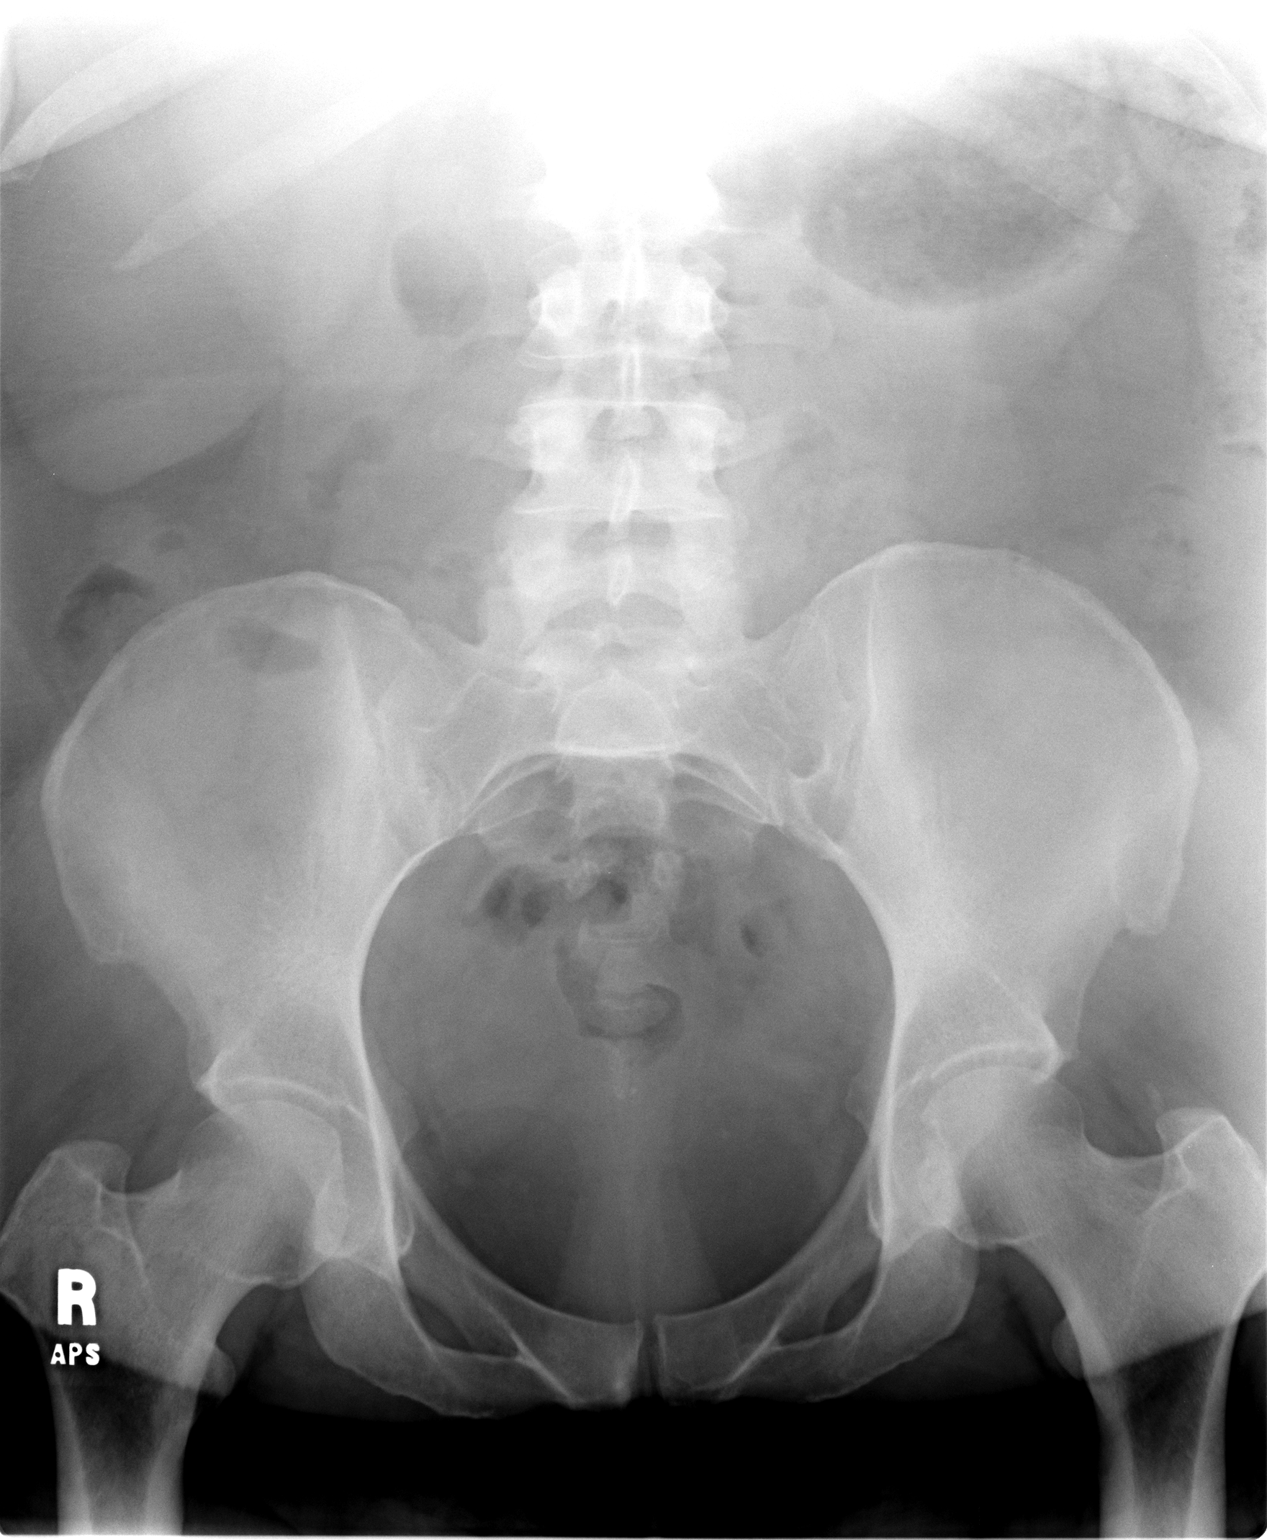

[2 of 2 positions shown; findings below may reference images not displayed]

FINDINGS: Supine views of the abdomen and pelvis.  The abdominal
film demonstrates no free intraperitoneal air or significant air
fluid levels.

The pelvic film demonstrates distal gas and stool.  No radiopaque
intrauterine device identified. No abnormal abdominal
calcifications.   No appendicolith.

Question constipation, with a moderate amount of descending and
transverse colonic gas.
IMPRESSION: 1.  No evidence of intrauterine device.
2.  No acute findings.  Possible constipation.

## 2011-12-19 DIAGNOSIS — N939 Abnormal uterine and vaginal bleeding, unspecified: Secondary | ICD-10-CM

## 2011-12-19 HISTORY — DX: Abnormal uterine and vaginal bleeding, unspecified: N93.9

## 2012-02-08 ENCOUNTER — Encounter (INDEPENDENT_AMBULATORY_CARE_PROVIDER_SITE_OTHER): Payer: Medicare Other | Admitting: Obstetrics and Gynecology

## 2012-02-08 DIAGNOSIS — N920 Excessive and frequent menstruation with regular cycle: Secondary | ICD-10-CM

## 2012-02-22 ENCOUNTER — Encounter: Payer: Medicare Other | Admitting: Obstetrics and Gynecology

## 2012-02-23 ENCOUNTER — Encounter (INDEPENDENT_AMBULATORY_CARE_PROVIDER_SITE_OTHER): Payer: Medicare Other | Admitting: Obstetrics and Gynecology

## 2012-02-23 DIAGNOSIS — Z3040 Encounter for surveillance of contraceptives, unspecified: Secondary | ICD-10-CM

## 2012-02-27 ENCOUNTER — Emergency Department (INDEPENDENT_AMBULATORY_CARE_PROVIDER_SITE_OTHER)
Admission: EM | Admit: 2012-02-27 | Discharge: 2012-02-27 | Disposition: A | Discharge status: Home / Self Care | Payer: Medicare Other | Source: Home / Self Care | Attending: Emergency Medicine | Admitting: Emergency Medicine

## 2012-02-27 ENCOUNTER — Encounter (HOSPITAL_COMMUNITY): Payer: Self-pay

## 2012-02-27 DIAGNOSIS — R21 Rash and other nonspecific skin eruption: Secondary | ICD-10-CM

## 2012-02-27 MED ORDER — TRIAMCINOLONE ACETONIDE 0.1 % EX CREA: TOPICAL_CREAM | Freq: Two times a day (BID) | CUTANEOUS | Status: DC

## 2012-02-27 NOTE — Discharge Instructions (Signed)
This current rash is not consistent tenderness full character with zoster (shingles). Have recommended you use is moderate potency steroid and apply to this rash for 10-14 days. Be observant of any changes specially if other members at home develop similar looking rash is a differential will have to include insect bites. This rash also meets criteria for urticaria. H. rash changes character or manifest on the opposite leg bring it up to the attention of her primary care doctor's further evaluation will be needed to rule out other processes.

## 2012-02-27 NOTE — ED Notes (Signed)
Pt. C/o rash on right upper leg X1 week. States it started as just one patch on back of right upper leg and has spread around to front. States itching and burning pain, "like fire". States she has been using hydrocortisone with no relief.

## 2012-02-27 NOTE — ED Provider Notes (Signed)
History     CSN: [id number]  Arrival date & time 02/27/12  1209   First MD Initiated Contact with Patient 02/27/12 1209      Chief Complaint  Patient presents with  . Rash    (Consider location/radiation/quality/duration/timing/severity/associated sxs/prior treatment) Patient is a 41 y.o. female presenting with rash. The history is provided by the patient.  Rash  This is a new problem. The current episode started more than 1 week ago. The problem has not changed since onset.   Past Medical History  Diagnosis Date  . Fibroid   . Preterm labor   . Herpes   . Bipolar 1 disorder   . Hypertension     on aldomet  . Diabetes mellitus     nph 20U qam and qpm, regular with meals  . Diabetes in pregnancy   . Headache   . Depression     recently added wellbutrin-has not taken yet for bipolar    Past Surgical History  Procedure Date  . Cesarean section     History reviewed. No pertinent family history.  History  Substance Use Topics  . Smoking status: Current Everyday Smoker -- 0.5 packs/day for 20 years    Types: Cigarettes  . Smokeless tobacco: Never Used  . Alcohol Use: No    OB History    Grav Para Term Preterm Abortions TAB SAB Ect Mult Living   5 4 1 3 1 1  0 0 0 4      Review of Systems  Skin: Positive for rash.    Allergies  Sulfa antibiotics and Strawberry  Home Medications   Current Outpatient Rx  Name Route Sig Dispense Refill  . CETIRIZINE HCL 10 MG PO TABS Oral Take 10 mg by mouth daily.      [name] ZYRTEC ALLERGY PO Oral Take by mouth.     . FOLIC ACID 1 MG PO TABS Oral Take 3 mg by mouth daily.     [name] FOLIC ACID PO Oral Take by mouth.     . INSULIN ISOPHANE HUMAN 100 UNIT/ML [place] SUSP Subcutaneous Inject into the skin.      . INSULIN REGULAR HUMAN 100 UNIT/ML IJ SOLN Subcutaneous Inject into the skin 3 (three) times daily before meals.      [name] MEDROXYPROGESTERONE ACETATE 150 MG/ML IM SUSP Intramuscular Inject 1 mL (150 mg total) into the muscle every  3 (three) months. 1 mL 0    Give prior to discharge  . METHYLDOPA 250 MG PO TABS Oral Take 250 mg by mouth 3 (three) times daily.      [name] PRENATAL 1 PO Oral Take 1 tablet by mouth daily.      [name] PRENATAL PLUS 27-1 MG PO TABS Oral Take 1 tablet by mouth daily.      [name] VALACYCLOVIR HCL 500 MG PO TABS Oral Take 500 mg by mouth daily.      [name] VALTREX PO Oral Take by mouth.      . ZOLPIDEM TARTRATE 10 MG PO TABS Oral Take 10 mg by mouth at bedtime as needed. Patient takes for sleep    . AMBIEN PO Oral Take by mouth.        BP 124/67  Pulse 108  Temp(Src) 98.2 F (36.8 C) (Oral)  Resp 14  SpO2 99%  LMP 10/18/2011  Physical Exam  ED Course  Procedures (including critical care time)  Labs Reviewed - No data to display No results found.   No diagnosis found.  MDM          Jimmie Molly, MD 02/27/12 1328

## 2012-02-28 ENCOUNTER — Ambulatory Visit: Payer: Self-pay | Admitting: Obstetrics and Gynecology

## 2012-02-29 ENCOUNTER — Encounter (INDEPENDENT_AMBULATORY_CARE_PROVIDER_SITE_OTHER): Payer: Medicaid Other

## 2012-02-29 ENCOUNTER — Encounter (INDEPENDENT_AMBULATORY_CARE_PROVIDER_SITE_OTHER): Payer: Medicare Other | Admitting: Obstetrics and Gynecology

## 2012-02-29 DIAGNOSIS — Z30430 Encounter for insertion of intrauterine contraceptive device: Secondary | ICD-10-CM

## 2012-03-20 ENCOUNTER — Emergency Department (INDEPENDENT_AMBULATORY_CARE_PROVIDER_SITE_OTHER)
Admission: EM | Admit: 2012-03-20 | Discharge: 2012-03-20 | Disposition: A | Discharge status: Home / Self Care | Payer: Medicare Other | Source: Home / Self Care | Attending: Family Medicine | Admitting: Family Medicine

## 2012-03-20 ENCOUNTER — Encounter (HOSPITAL_COMMUNITY): Payer: Self-pay | Admitting: Emergency Medicine

## 2012-03-20 DIAGNOSIS — IMO0002 Reserved for concepts with insufficient information to code with codable children: Secondary | ICD-10-CM

## 2012-03-20 MED ORDER — DOXYCYCLINE HYCLATE 100 MG PO CAPS: 100.0000 mg | ORAL_CAPSULE | Freq: Two times a day (BID) | ORAL | Status: AC

## 2012-03-20 NOTE — ED Provider Notes (Signed)
History     CSN: [id number]  Arrival date & time 03/20/12  1133   First MD Initiated Contact with Patient 03/20/12 1340      Chief Complaint  Patient presents with  . Nail Problem    (Consider location/radiation/quality/duration/timing/severity/associated sxs/prior treatment) HPI Comments: The patient reports having pain and swelling around the nail of her right thumb. Onset 3 days ago. States it throbs. No known injury. Hx of prior episodes on other fingers. Hx pos for mrsa. She has tried soaking in warm water.   The history is provided by the patient.    Past Medical History  Diagnosis Date  . Fibroid   . Preterm labor   . Herpes   . Bipolar 1 disorder   . Hypertension     on aldomet  . Diabetes mellitus     nph 20U qam and qpm, regular with meals  . Diabetes in pregnancy   . Headache   . Depression     recently added wellbutrin-has not taken yet for bipolar    Past Surgical History  Procedure Date  . Cesarean section     History reviewed. No pertinent family history.  History  Substance Use Topics  . Smoking status: Current Everyday Smoker -- 0.5 packs/day for 20 years    Types: Cigarettes  . Smokeless tobacco: Never Used  . Alcohol Use: No    OB History    Grav Para Term Preterm Abortions TAB SAB Ect Mult Living   5 4 1 3 1 1  0 0 0 4      Review of Systems  Constitutional: Negative.   HENT: Negative.   Respiratory: Negative.   Cardiovascular: Negative.   Gastrointestinal: Negative.   Genitourinary: Negative.   Musculoskeletal: Negative.     Allergies  Sulfa antibiotics and Strawberry  Home Medications   Current Outpatient Rx  Name Route Sig Dispense Refill  . LISINOPRIL 10 MG PO TABS Oral Take 10 mg by mouth daily.    [name] CETIRIZINE HCL 10 MG PO TABS Oral Take 10 mg by mouth daily.      [name] ZYRTEC ALLERGY PO Oral Take by mouth.     . DOXYCYCLINE HYCLATE 100 MG PO CAPS Oral Take 1 capsule (100 mg total) by mouth 2 (two) times daily. 20  capsule 0  . FOLIC ACID 1 MG PO TABS Oral Take 3 mg by mouth daily.     [name] FOLIC ACID PO Oral Take by mouth.     . INSULIN ISOPHANE HUMAN 100 UNIT/ML [place] SUSP Subcutaneous Inject into the skin.      . INSULIN REGULAR HUMAN 100 UNIT/ML IJ SOLN Subcutaneous Inject into the skin 3 (three) times daily before meals.      [name] MEDROXYPROGESTERONE ACETATE 150 MG/ML IM SUSP Intramuscular Inject 1 mL (150 mg total) into the muscle every 3 (three) months. 1 mL 0    Give prior to discharge  . METHYLDOPA 250 MG PO TABS Oral Take 250 mg by mouth 3 (three) times daily.      [name] PRENATAL 1 PO Oral Take 1 tablet by mouth daily.      [name] PRENATAL PLUS 27-1 MG PO TABS Oral Take 1 tablet by mouth daily.      . TRIAMCINOLONE ACETONIDE 0.1 % EX CREA Topical Apply topically 2 (two) times daily. Apply affected area twice a week- 30 g 0  . VALACYCLOVIR HCL 500 MG PO TABS Oral Take 500 mg by mouth daily.      [name]  VALTREX PO Oral Take by mouth.      . ZOLPIDEM TARTRATE 10 MG PO TABS Oral Take 10 mg by mouth at bedtime as needed. Patient takes for sleep    . AMBIEN PO Oral Take by mouth.        BP 141/71  Pulse 109  Temp(Src) 98 F (36.7 C) (Oral)  Resp 14  SpO2 100%  LMP 03/17/2012  Physical Exam  Nursing note and vitals reviewed. Constitutional: She appears well-developed and well-nourished.  Cardiovascular: Normal rate and regular rhythm.   Pulmonary/Chest: Effort normal and breath sounds normal.  Musculoskeletal:       Swelling and erythema noted right thumb near the nail consistent with a paronychia. Opened with a steril needle. Moderate amt of purulent discharge expressed. The patient states feels much better.     ED Course  Procedures (including critical care time)  Labs Reviewed - No data to display No results found.   1. Paronychia       MDM          Randa Spike, MD 03/20/12 [phone]

## 2012-03-20 NOTE — ED Notes (Signed)
Pt here with paronychia of the right hand side of thumb x 3 dys ago.throb pulsating pain and numbness.

## 2012-03-20 NOTE — Discharge Instructions (Signed)
Soak in warm water twice daily x 15 min. Follow up if any complication. Tylenol or motrin as needed .

## 2012-04-04 ENCOUNTER — Ambulatory Visit (INDEPENDENT_AMBULATORY_CARE_PROVIDER_SITE_OTHER): Payer: Medicare Other | Admitting: Obstetrics and Gynecology

## 2012-04-04 ENCOUNTER — Encounter: Payer: Self-pay | Admitting: Obstetrics and Gynecology

## 2012-04-04 VITALS — BP 148/80 | Wt 220.0 lb

## 2012-04-04 DIAGNOSIS — N926 Irregular menstruation, unspecified: Secondary | ICD-10-CM

## 2012-04-04 DIAGNOSIS — Z975 Presence of (intrauterine) contraceptive device: Secondary | ICD-10-CM

## 2012-04-04 HISTORY — DX: Irregular menstruation, unspecified: N92.6

## 2012-04-04 NOTE — Progress Notes (Signed)
IUD SURVEILLANCE NOTE  MIRENA IUD inserted on 02/23/12  Pain: none Bleeding: minimal Patient has checked IUD strings: yes Other complaints: pt is very sstressed.  Her husband is using drugs and left her.  She has not been taking her meds for her DM  EXAM:  IUD strings visualized: yes              Pelvic exam normal: yes Physical Examination: Pelvic - normal external genitalia, vulva, vagina, cervix, uterus and adnexa  Next visit with annual exam or PRN Pt in counseling Tried to develop a plan with the pt about maintaining adequate BS control   Aysa Larivee A MD 04/04/2012 12:17 PM

## 2012-04-29 ENCOUNTER — Encounter (HOSPITAL_COMMUNITY): Payer: Self-pay

## 2012-04-29 ENCOUNTER — Emergency Department (INDEPENDENT_AMBULATORY_CARE_PROVIDER_SITE_OTHER)
Admission: EM | Admit: 2012-04-29 | Discharge: 2012-04-29 | Disposition: A | Discharge status: Home Health | Payer: Medicare Other | Source: Home / Self Care | Attending: Emergency Medicine | Admitting: Emergency Medicine

## 2012-04-29 DIAGNOSIS — S60552A Superficial foreign body of left hand, initial encounter: Secondary | ICD-10-CM

## 2012-04-29 DIAGNOSIS — S60459A Superficial foreign body of unspecified finger, initial encounter: Secondary | ICD-10-CM

## 2012-04-29 MED ORDER — BACITRACIN 500 UNIT/GM EX OINT
1.0000 "application " | TOPICAL_OINTMENT | Freq: Once | CUTANEOUS | Status: AC
  Administered 2012-04-29: 1 via TOPICAL

## 2012-04-29 MED ORDER — TETANUS-DIPHTH-ACELL PERTUSSIS 5-2.5-18.5 LF-MCG/0.5 IM SUSP
0.5000 mL | Freq: Once | INTRAMUSCULAR | Status: AC
  Administered 2012-04-29: 0.5 mL via INTRAMUSCULAR

## 2012-04-29 MED ORDER — AMOXICILLIN-POT CLAVULANATE 875-125 MG PO TABS: 1.0000 | ORAL_TABLET | Freq: Two times a day (BID) | ORAL | Status: AC

## 2012-04-29 MED ORDER — IBUPROFEN 600 MG PO TABS: 600.0000 mg | ORAL_TABLET | Freq: Four times a day (QID) | ORAL | Status: DC | PRN

## 2012-04-29 MED ORDER — HYDROCODONE-ACETAMINOPHEN 5-325 MG PO TABS: 2.0000 | ORAL_TABLET | Freq: Four times a day (QID) | ORAL | Status: AC | PRN

## 2012-04-29 MED ORDER — TETANUS-DIPHTH-ACELL PERTUSSIS 5-2.5-18.5 LF-MCG/0.5 IM SUSP
INTRAMUSCULAR | Status: AC
  Filled 2012-04-29: qty 0.5

## 2012-04-29 MED ORDER — IBUPROFEN 600 MG PO TABS: 600.0000 mg | ORAL_TABLET | Freq: Four times a day (QID) | ORAL | Status: AC | PRN

## 2012-04-29 MED ORDER — LIDOCAINE-EPINEPHRINE 2 %-1:100000 IJ SOLN
5.0000 mL | Freq: Once | INTRAMUSCULAR | Status: AC
  Administered 2012-04-29: 5 mL

## 2012-04-29 NOTE — ED Provider Notes (Signed)
History     CSN: [id number]  Arrival date & time 04/29/12  1709   First MD Initiated Contact with Patient 04/29/12 1802      Chief Complaint  Patient presents with  . Foreign Body in Skin    (Consider location/radiation/quality/duration/timing/severity/associated sxs/prior treatment) HPI Comments: Patient is a right-handed female who reports getting a splinter from an old porch earlier today. Splinters located in the thenar eminence of  her left hand.. Reports pain, swelling around the site. She attempted to remove the splint herself, but was unable to do so. No numbness, tingling, redness in her hand or streaking up her extremity. Tetanus out of date.  Patient is a 41 y.o. female presenting with hand injury. The history is provided by the patient. No language interpreter was used.  Hand Injury  The incident occurred 3 to 5 hours ago. The incident occurred at home. The pain is present in the left hand. The quality of the pain is described as aching. The pain has been constant since the incident. Pertinent negatives include no fever. Possible foreign bodies include wood. The symptoms are aggravated by movement, use and palpation. She has tried nothing for the symptoms. The treatment provided no relief.    Past Medical History  Diagnosis Date  . Fibroid   . Preterm labor   . Herpes   . Bipolar 1 disorder   . Hypertension     on aldomet  . Diabetes mellitus     nph 20U qam and qpm, regular with meals  . Diabetes in pregnancy   . Headache   . Depression     recently added wellbutrin-has not taken yet for bipolar    Past Surgical History  Procedure Date  . Cesarean section     History reviewed. No pertinent family history.  History  Substance Use Topics  . Smoking status: Current Everyday Smoker -- 0.5 packs/day for 20 years    Types: Cigarettes  . Smokeless tobacco: Never Used  . Alcohol Use: No    OB History    Grav Para Term Preterm Abortions TAB SAB Ect Mult  Living   5 4 1 3 1 1  0 0 0 4      Review of Systems  Constitutional: Negative for fever.  Musculoskeletal: Negative for myalgias.  Skin: Positive for wound. Negative for color change.  Neurological: Negative for weakness and numbness.    Allergies  Sulfa antibiotics and Strawberry  Home Medications   Current Outpatient Rx  Name Route Sig Dispense Refill  . CARBAMAZEPINE 200 MG PO TABS Oral Take 200 mg by mouth 3 (three) times daily.    . INSULIN DETEMIR 100 UNIT/ML [place] SOLN Subcutaneous Inject into the skin at bedtime.    . AMOXICILLIN-POT CLAVULANATE 875-125 MG PO TABS Oral Take 1 tablet by mouth 2 (two) times daily. X 10 days 20 tablet 0  . CETIRIZINE HCL 10 MG PO TABS Oral Take 10 mg by mouth daily.      [name] HYDROCODONE-ACETAMINOPHEN 5-325 MG PO TABS Oral Take 2 tablets by mouth every 6 (six) hours as needed for pain. 20 tablet 0  . IBUPROFEN 600 MG PO TABS Oral Take 1 tablet (600 mg total) by mouth every 6 (six) hours as needed for pain. 20 tablet 0  . LISINOPRIL 10 MG PO TABS Oral Take 10 mg by mouth daily.    [name] VALACYCLOVIR HCL 500 MG PO TABS Oral Take 500 mg by mouth daily.      [name]  ZOLPIDEM TARTRATE 10 MG PO TABS Oral Take 10 mg by mouth at bedtime as needed. Patient takes for sleep      BP 146/78  Pulse 90  Temp(Src) 98.4 F (36.9 C) (Oral)  Resp 20  SpO2 100%  LMP 04/15/2012  Physical Exam  Nursing note and vitals reviewed. Constitutional: She is oriented to person, place, and time. She appears well-developed and well-nourished. No distress.  HENT:  Head: Normocephalic and atraumatic.  Eyes: Conjunctivae and EOM are normal.  Neck: Normal range of motion.  Cardiovascular: Normal rate.   Pulmonary/Chest: Effort normal.  Abdominal: She exhibits no distension.  Musculoskeletal: Normal range of motion.       Hands:      Foreign body- see drawing. Mild surrounding edema, tenderness. Baseline Strength and Sensation to L hand with normal light touch intact for motor and  sensation in median/radial/ulnar nerve distribution with CR< 2 secs and pulse intact.  Wrist WNL.    Neurological: She is alert and oriented to person, place, and time.  Skin: Skin is warm and dry.  Psychiatric: She has a normal mood and affect. Her behavior is normal. Judgment and thought content normal.    ED Course  FOREIGN BODY REMOVAL Date/Time: 04/29/2012 7:33 PM Performed by: Luiz Blare Authorized by: Luiz Blare Consent: Verbal consent obtained. Risks and benefits: risks, benefits and alternatives were discussed Consent given by: patient Patient understanding: patient states understanding of the procedure being performed Patient consent: the patient's understanding of the procedure matches consent given Procedure consent: procedure consent matches procedure scheduled Required items: required blood products, implants, devices, and special equipment available Patient identity confirmed: verbally with patient Time out: Immediately prior to procedure a "time out" was called to verify the correct patient, procedure, equipment, support staff and site/side marked as required. Intake: Palm of left hand. Anesthesia: local infiltration Local anesthetic: lidocaine 2% with epinephrine Anesthetic total: 1 ml Patient sedated: no Patient cooperative: yes Complexity: simple 1 objects recovered. Objects recovered: 1 cm wooden splinter Post-procedure assessment: foreign body removed Patient tolerance: Patient tolerated the procedure well with no immediate complications. Comments: Irrigated wound with 4 mL lidocaine with  epinephrine, with adequate hemostasis. no residual foreign bodies seen. Applied bacitracin, sterile pressure dressing.    (including critical care time)  Labs Reviewed - No data to display No results found.   1. Foreign body, hand, superficial, left, initial encounter     MDM  Updated tetanus, sending home on Augmentin, because the splinter was  heavily contaminated , patient is a diabetic and a smoker, and is at high risk for infection. Discussed signs and symptoms that should prompt  return to the ER. Patient agrees with plan.  Luiz Blare, MD 04/29/12 2031

## 2012-04-29 NOTE — ED Notes (Signed)
Pt c/o splinter in L palm.  Pt states it occurred this morning.  Pt attempted to remove with no success.

## 2012-04-29 NOTE — Discharge Instructions (Signed)
Return for any signs of infections as we discussed. Make sure you keep your sugars under good control. Poorly controlled diabetes and smoking increases the risk for poor healing.

## 2012-06-15 ENCOUNTER — Telehealth: Payer: Self-pay | Admitting: Obstetrics and Gynecology

## 2012-06-15 NOTE — Telephone Encounter (Signed)
done

## 2012-06-16 ENCOUNTER — Ambulatory Visit (INDEPENDENT_AMBULATORY_CARE_PROVIDER_SITE_OTHER): Payer: Medicare Other | Admitting: Obstetrics and Gynecology

## 2012-06-16 ENCOUNTER — Encounter: Payer: Self-pay | Admitting: Obstetrics and Gynecology

## 2012-06-16 VITALS — BP 120/62 | Ht 64.0 in | Wt 215.0 lb

## 2012-06-16 DIAGNOSIS — N898 Other specified noninflammatory disorders of vagina: Secondary | ICD-10-CM

## 2012-06-16 DIAGNOSIS — Z30431 Encounter for routine checking of intrauterine contraceptive device: Secondary | ICD-10-CM

## 2012-06-16 LAB — POCT WET PREP (WET MOUNT): Clue Cells Wet Prep Whiff POC: POSITIVE

## 2012-06-16 MED ORDER — TINIDAZOLE 500 MG PO TABS: 2.0000 g | ORAL_TABLET | Freq: Every day | ORAL | Status: AC

## 2012-06-16 NOTE — Progress Notes (Addendum)
C/o vag d/c  Filed Vitals:   06/16/12 1607  BP: 120/62   ROS: noncontributory  Pelvic exam:  VULVA: normal appearing vulva with no masses, tenderness or lesions,  VAGINA: normal appearing vagina with normal color and discharge, no lesions, yellow vag d/c with odor CERVIX: normal appearing cervix without discharge or lesions, string not visible UTERUS: uterus is normal size, shape, consistency and nontender,  ADNEXA: normal adnexa in size, nontender and no masses.  Results for orders placed in visit on 06/16/12  POCT WET PREP (WET MOUNT)      Component Value Range   Source Wet Prep POC       WBC, Wet Prep HPF POC       Bacteria Wet Prep HPF POC few     BACTERIA WET PREP MORPHOLOGY POC       Clue Cells Wet Prep HPF POC Moderate     CLUE CELLS WET PREP WHIFF POC Positive Whiff     Yeast Wet Prep HPF POC None     KOH Wet Prep POC       Trichomonas Wet Prep HPF POC neg     pH 5.5      A/P Wet prep - BV - tindamax U/s next available to check for IUD

## 2012-06-17 ENCOUNTER — Telehealth: Payer: Self-pay | Admitting: Obstetrics and Gynecology

## 2012-06-17 NOTE — Telephone Encounter (Signed)
Pt called to state that the pharmacy she uses has not received the rx for Tindamax that was prescribed yesterday.  She thinks it was sent to a pharmacy in [place] and desires to have the prescription called to [facility] on [address].  Rx Tindamax 500mg  tabs, #8, no rf, 4 tabs (2000mg ) po daily x 2 days phoned to [facility] on [address].

## 2012-06-26 ENCOUNTER — Telehealth: Payer: Self-pay | Admitting: Obstetrics and Gynecology

## 2012-06-26 NOTE — Telephone Encounter (Signed)
Jackie/ar pt 

## 2012-06-27 ENCOUNTER — Telehealth: Payer: Self-pay

## 2012-06-30 ENCOUNTER — Telehealth: Payer: Self-pay | Admitting: Obstetrics and Gynecology

## 2012-06-30 NOTE — Telephone Encounter (Signed)
Spoke to pt to answer ? Re: if she can have yeast inf, BV and her period all at the same time. Unfortunately, the answer is yes. Pt has tried numerous treatments, but has been suffering with at least one sometimes three things all at once for 5 weeks now. She is seeing Dr. Su Hilt on Monday. Pt will be evaluated at that time for these sx's as well as F/U for U/S. Melody Comas A

## 2012-07-03 ENCOUNTER — Ambulatory Visit (INDEPENDENT_AMBULATORY_CARE_PROVIDER_SITE_OTHER): Payer: Medicare Other

## 2012-07-03 ENCOUNTER — Ambulatory Visit (INDEPENDENT_AMBULATORY_CARE_PROVIDER_SITE_OTHER): Payer: Medicare Other | Admitting: Obstetrics and Gynecology

## 2012-07-03 ENCOUNTER — Other Ambulatory Visit: Payer: Self-pay | Admitting: Obstetrics and Gynecology

## 2012-07-03 ENCOUNTER — Encounter: Payer: Self-pay | Admitting: Obstetrics and Gynecology

## 2012-07-03 VITALS — BP 112/70 | Ht 64.0 in | Wt 212.0 lb

## 2012-07-03 DIAGNOSIS — Z30431 Encounter for routine checking of intrauterine contraceptive device: Secondary | ICD-10-CM

## 2012-07-03 DIAGNOSIS — Z309 Encounter for contraceptive management, unspecified: Secondary | ICD-10-CM

## 2012-07-03 MED ORDER — FLUCONAZOLE 150 MG PO TABS: 150.0000 mg | ORAL_TABLET | Freq: Once | ORAL | Status: AC

## 2012-07-03 NOTE — Progress Notes (Signed)
IUD not visible on u/s -  Measuring 8.3 x 4.0 x 3.9 cm normal bilateral ovaries and as mentioned previously IUD not visualized  A/P Sched abd/pelvic xray Options for contraception discussed - Pt wants BTL - sign tubal papers today AEX

## 2012-07-04 ENCOUNTER — Telehealth: Payer: Self-pay

## 2012-07-04 NOTE — Telephone Encounter (Signed)
Note created to close encounter. Sarah Long

## 2012-07-10 ENCOUNTER — Other Ambulatory Visit: Payer: Self-pay

## 2012-07-10 ENCOUNTER — Telehealth: Payer: Self-pay | Admitting: Obstetrics and Gynecology

## 2012-07-10 NOTE — Telephone Encounter (Signed)
JACKIE/RX REQ.

## 2012-07-10 NOTE — Telephone Encounter (Signed)
Spoke to pt who needs Abd and pelvic xray to look for IUD. Order faxed to  [facility] for pt to go tomorrow. Pt is requesting RX for Ib 800 mg . I have sent a message to AR for auth. For Rx. Pt understands. JO, CMA

## 2012-07-10 NOTE — Telephone Encounter (Signed)
JACKIE/AR PT °

## 2012-07-11 ENCOUNTER — Ambulatory Visit
Admission: RE | Admit: 2012-07-11 | Discharge: 2012-07-11 | Disposition: A | Discharge status: Home / Self Care | Payer: Medicare Other | Source: Ambulatory Visit | Attending: Obstetrics and Gynecology | Admitting: Obstetrics and Gynecology

## 2012-07-11 ENCOUNTER — Other Ambulatory Visit: Payer: Self-pay | Admitting: Obstetrics and Gynecology

## 2012-07-11 DIAGNOSIS — IMO0002 Reserved for concepts with insufficient information to code with codable children: Secondary | ICD-10-CM

## 2012-07-12 ENCOUNTER — Telehealth: Payer: Self-pay | Admitting: Obstetrics and Gynecology

## 2012-07-13 ENCOUNTER — Telehealth: Payer: Self-pay

## 2012-07-13 NOTE — Telephone Encounter (Signed)
Pt was called regarding RX sent from her pharmacy for Tindamax.  Pt stated that she has no symptoms at this time. Form faxed back, denied. Sarah Long

## 2012-07-14 ENCOUNTER — Telehealth: Payer: Self-pay

## 2012-07-14 NOTE — Telephone Encounter (Signed)
LM for pt to cb re: xray results. Also, she needs to come in to sign BTL papers. JO, CMA

## 2012-09-19 ENCOUNTER — Telehealth: Payer: Self-pay | Admitting: Obstetrics and Gynecology

## 2012-09-19 NOTE — Telephone Encounter (Signed)
Patient was suppose to come in and signe tubal papers and has not.  I am putting her orders on hold until the patient follows up and does what she said she would do.  When I spoke to the patient, in early August, she cited car trouble, but to date, the tubal papers still have not been signed. -Adrianne Pridgen

## 2012-10-09 NOTE — Telephone Encounter (Signed)
Note to close encounter only. Messi Twedt, Jacqueline A  

## 2012-11-17 ENCOUNTER — Telehealth: Payer: Self-pay | Admitting: Obstetrics and Gynecology

## 2012-11-19 ENCOUNTER — Telehealth: Payer: Self-pay | Admitting: Obstetrics and Gynecology

## 2012-11-19 NOTE — Telephone Encounter (Signed)
discusssed dosing 1000 mg BID x 7 days, peri bottle to make easier to urinate. No intercouse until symptoms resolved. Lavera Guise, CNM

## 2012-11-24 ENCOUNTER — Encounter (HOSPITAL_COMMUNITY): Payer: Self-pay | Admitting: *Deleted

## 2012-11-24 ENCOUNTER — Inpatient Hospital Stay (HOSPITAL_COMMUNITY)
Admission: AD | Admit: 2012-11-24 | Discharge: 2012-11-24 | Disposition: A | Discharge status: Home / Self Care | Payer: Medicare Other | Source: Ambulatory Visit | Attending: Obstetrics and Gynecology | Admitting: Obstetrics and Gynecology

## 2012-11-24 DIAGNOSIS — B3731 Acute candidiasis of vulva and vagina: Secondary | ICD-10-CM | POA: Insufficient documentation

## 2012-11-24 DIAGNOSIS — B373 Candidiasis of vulva and vagina: Secondary | ICD-10-CM

## 2012-11-24 LAB — URINALYSIS, ROUTINE W REFLEX MICROSCOPIC
Bilirubin Urine: NEGATIVE
Glucose, UA: >1000 mg/dL — AB
Ketones, ur: NEGATIVE mg/dL
pH: 6 (ref 5.0–8.0)

## 2012-11-24 LAB — URINE MICROSCOPIC-ADD ON

## 2012-11-24 LAB — WET PREP, GENITAL: Clue Cells Wet Prep HPF POC: NONE SEEN

## 2012-11-24 MED ORDER — LIDOCAINE 5 % EX OINT
TOPICAL_OINTMENT | Freq: Every day | CUTANEOUS | Status: DC | PRN
  Administered 2012-11-24: 19:00:00 via TOPICAL
  Filled 2012-11-24: qty 35.44

## 2012-11-24 NOTE — MAU Note (Signed)
Outbreak with a bad yeast infection and bacterial infection all at the same time. Not responding to valtrex

## 2012-11-27 LAB — HERPES SIMPLEX VIRUS CULTURE: Culture: NOT DETECTED

## 2012-12-04 LAB — HEPATITIS C ANTIBODY: HCV Ab: NEGATIVE

## 2013-01-23 ENCOUNTER — Telehealth: Payer: Self-pay | Admitting: Obstetrics and Gynecology

## 2013-01-31 ENCOUNTER — Encounter: Payer: Medicare Other | Admitting: Obstetrics and Gynecology

## 2013-02-13 ENCOUNTER — Emergency Department (INDEPENDENT_AMBULATORY_CARE_PROVIDER_SITE_OTHER)
Admission: EM | Admit: 2013-02-13 | Discharge: 2013-02-13 | Disposition: A | Discharge status: Home / Self Care | Payer: Medicare Other | Source: Home / Self Care | Attending: Family Medicine | Admitting: Family Medicine

## 2013-02-13 ENCOUNTER — Encounter (HOSPITAL_COMMUNITY): Payer: Self-pay | Admitting: *Deleted

## 2013-02-13 ENCOUNTER — Encounter: Payer: Medicare Other | Admitting: Obstetrics and Gynecology

## 2013-02-13 MED ORDER — DOXYCYCLINE HYCLATE 100 MG PO CAPS: 100.0000 mg | ORAL_CAPSULE | Freq: Two times a day (BID) | ORAL | Status: DC

## 2013-02-13 NOTE — Discharge Instructions (Signed)
Soak twice a day for 5 days in warm water, take all of medicine, return if any problems. °

## 2013-02-13 NOTE — ED Provider Notes (Signed)
History     CSN: [id number]  Arrival date & time 02/13/13  1000   First MD Initiated Contact with Patient 02/13/13 1004      Chief Complaint  Patient presents with  . Hand Pain    (Consider location/radiation/quality/duration/timing/severity/associated sxs/prior treatment) Patient is a 42 y.o. female presenting with hand pain. The history is provided by the patient.  Hand Pain This is a new problem. The current episode started more than 2 days ago (lmf paronychia sts). The problem has been gradually worsening.    Past Medical History  Diagnosis Date  . Fibroid   . Preterm labor   . Herpes   . Bipolar 1 disorder   . Hypertension     on aldomet  . Diabetes mellitus     nph 20U qam and qpm, regular with meals  . Diabetes in pregnancy   . Headache   . Depression     recently added wellbutrin-has not taken yet for bipolar  . H/O varicella   . Abnormal Pap smear 1998  . Trichomonas   . Yeast infection   . Bacterial infection   . Galactorrhea of right breast 2008  . Oligomenorrhea 07/2007  . Candida vaginitis 07/2007  . H/O amenorrhea 07/2007  . Hx: UTI (urinary tract infection) 2009  . Pelvic pain in female   . HSV-2 infection 01/03/2009  . H/O dysmenorrhea 2010  . Increased BMI 2010  . Heavy vaginal bleeding due to contraceptive injection use 10/12/2011    Depo Provera  . H/O menorrhagia 10/14/11  . Obesity 10/14/11  . H/O dizziness 10/14/11  . Abnormal vaginal bleeding 12/19/2011    Past Surgical History  Procedure Laterality Date  . Cesarean section  1991    Family History  Problem Relation Age of Onset  . Hypertension Mother   . Diabetes Mother   . Hypertension Father   . Diabetes Father   . Other Neg Hx     History  Substance Use Topics  . Smoking status: Current Every Day Smoker -- 0.50 packs/day for 20 years    Types: Cigarettes  . Smokeless tobacco: Never Used  . Alcohol Use: No    OB History   Grav Para Term Preterm Abortions TAB SAB Ect Mult  Living   5 4 1 3 1 1  0 0 0 4      Review of Systems  Constitutional: Negative.   Musculoskeletal: Positive for joint swelling.    Allergies  Sulfa antibiotics and Strawberry  Home Medications   Current Outpatient Rx  Name  Route  Sig  Dispense  Refill  . carbamazepine (TEGRETOL) 200 MG tablet   Oral   Take 200 mg by mouth 3 (three) times daily.         [name] doxycycline (VIBRAMYCIN) 100 MG capsule   Oral   Take 1 capsule (100 mg total) by mouth 2 (two) times daily.   20 capsule   0   . fish oil-omega-3 fatty acids 1000 MG capsule   Oral   Take 1 g by mouth daily.         . insulin detemir (LEVEMIR) 100 UNIT/ML injection   Subcutaneous   Inject 10-70 Units into the skin 2 (two) times daily. Takes 10 units in the morning and 70 units at night         . lisinopril (PRINIVIL,ZESTRIL) 10 MG tablet   Oral   Take 10 mg by mouth daily.         [name]  QUEtiapine (SEROQUEL) 25 MG tablet   Oral   Take 50 mg by mouth at bedtime.         . valACYclovir (VALTREX) 500 MG tablet   Oral   Take 500 mg by mouth daily.             BP 178/96  Pulse 72  Temp(Src) 98.6 F (37 C) (Oral)  Resp 16  SpO2 98%  Physical Exam  Nursing note and vitals reviewed. Constitutional: She is oriented to person, place, and time. She appears well-developed and well-nourished.  Musculoskeletal: She exhibits tenderness.       Hands: Neurological: She is alert and oriented to person, place, and time.  Skin: Skin is warm and dry.    ED Course  INCISION AND DRAINAGE Date/Time: 02/13/2013 10:42 AM Performed by: Linna Hoff Authorized by: Bradd Canary D Consent: Verbal consent obtained. Risks and benefits: risks, benefits and alternatives were discussed Consent given by: patient Type: abscess Body area: upper extremity Location details: left long finger Local anesthetic: topical anesthetic Patient sedated: no Scalpel size: 11 Incision type: single straight Complexity:  simple Drainage: purulent Drainage amount: moderate Patient tolerance: Patient tolerated the procedure well with no immediate complications. Comments: Betadine soaked, dsd, culture obtained.   (including critical care time)  Labs Reviewed  CULTURE, ROUTINE-ABSCESS   No results found.   1. Paronychia of third finger, left       MDM          Linna Hoff, MD 02/13/13 1047

## 2013-02-13 NOTE — ED Notes (Signed)
Pt  Reports    Pain l       Middle finger          X 4 days  With  Pus under base nail   Tender  To  The  Touch  And  It throbs         Pt  Bites  Her  Fingernails

## 2013-02-15 LAB — CULTURE, ROUTINE-ABSCESS

## 2013-02-18 ENCOUNTER — Telehealth (HOSPITAL_COMMUNITY): Payer: Self-pay | Admitting: *Deleted

## 2013-02-18 NOTE — ED Notes (Signed)
Abscess culture L finger:  Abundant MRSA. Pt. adequately treated with I and D and Doxycycline. I called and left a message to call. Vassie Moselle 02/18/2013

## 2013-02-18 NOTE — ED Notes (Signed)
Pt. called back and said her phone does not work well.  Pt. said she has had MRSA before.  Pt. told she was adequately treated with I and D and Doxycycline.   [place] MRSA instructions reviewed with pt.  Pt. voiced understanding. Vassie Moselle 02/18/2013

## 2013-02-18 NOTE — ED Notes (Signed)
Pt. called back.  Pt. verified x 2 and given results.  Call dropped or pt. hung up before instructions could be given.  I called and left another message for pt. to call. Vassie Moselle 02/18/2013

## 2013-04-20 IMAGING — US US OB FOLLOW-UP
1 series · 14 of 28 positions shown · non-contrast
Comparison: none

[Series 1: us ob follow-up · 0.20mm/px · 33 acquisitions, 14 frames shown]
[im 2/33]
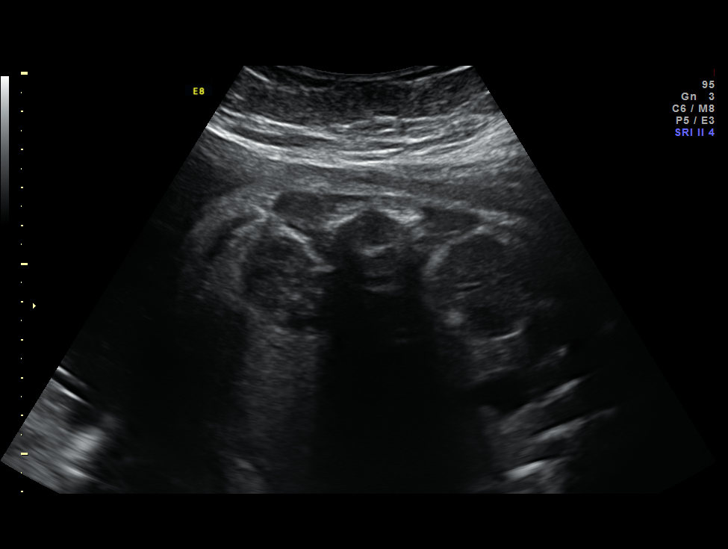
[im 4/33]
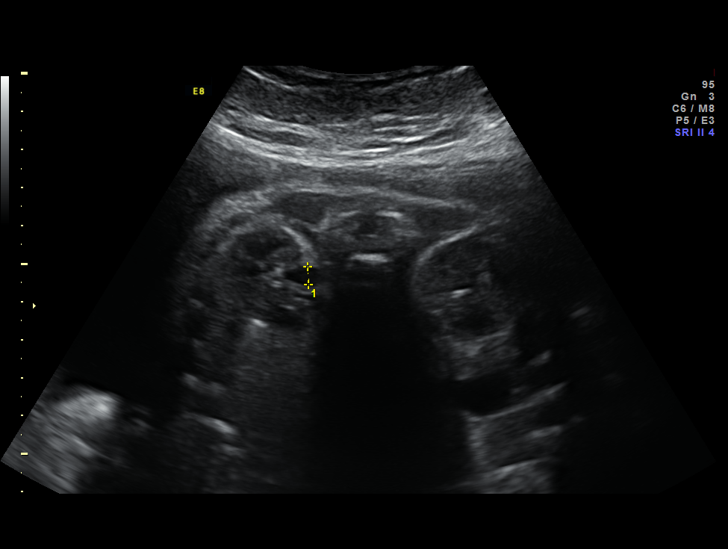
[im 6/33]
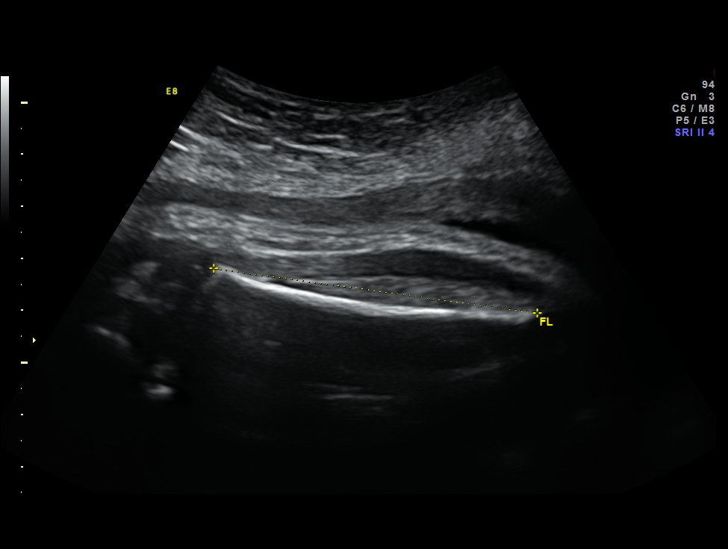
[im 9/33]
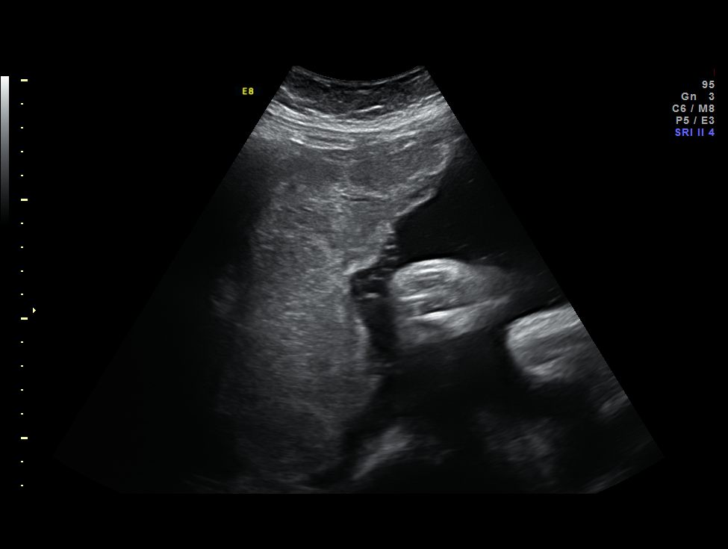
[im 11/33]
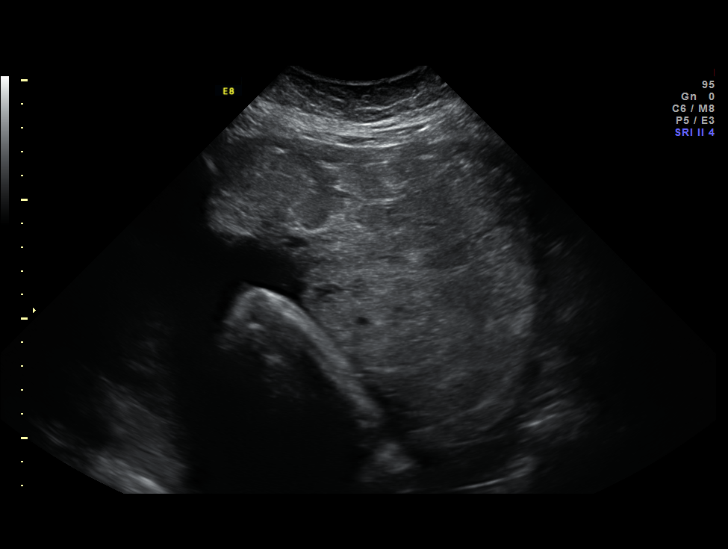
[im 14/33]
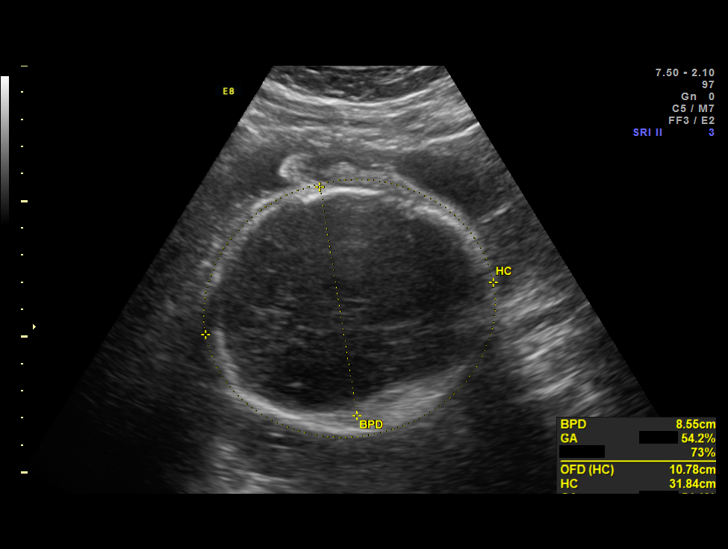
[im 16/33]
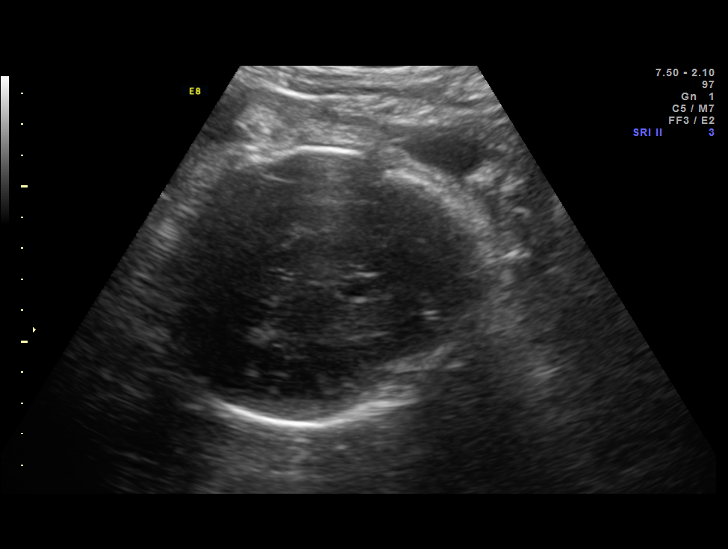
[im 18/33]
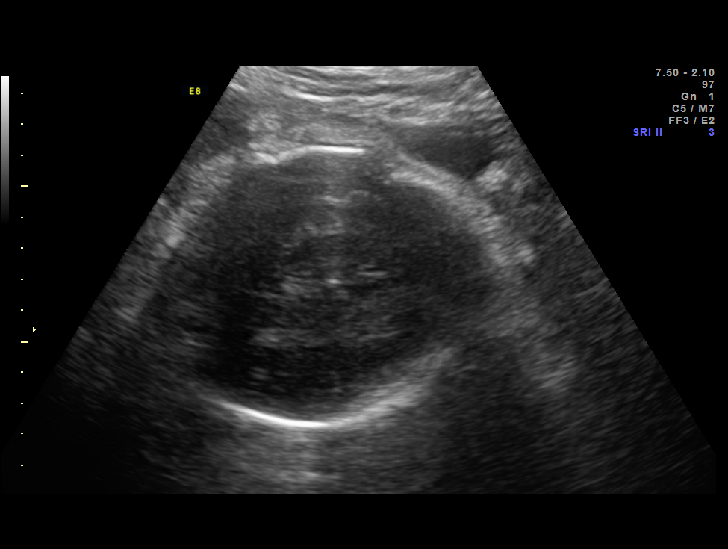
[im 21/33]
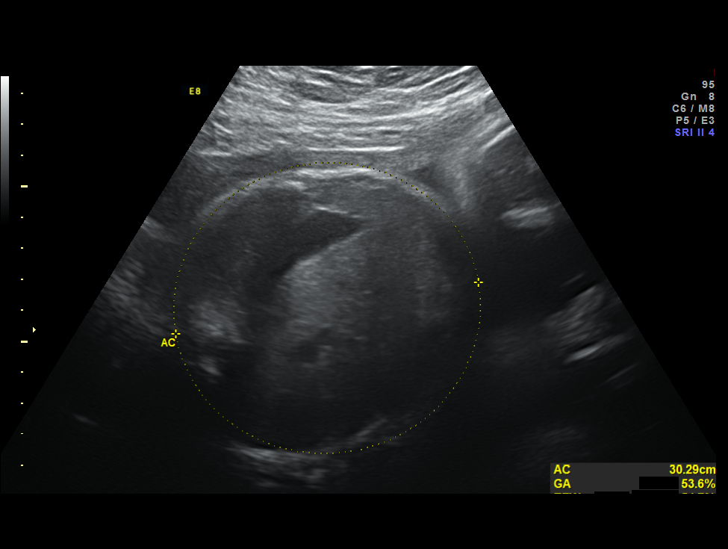
[im 23/33]
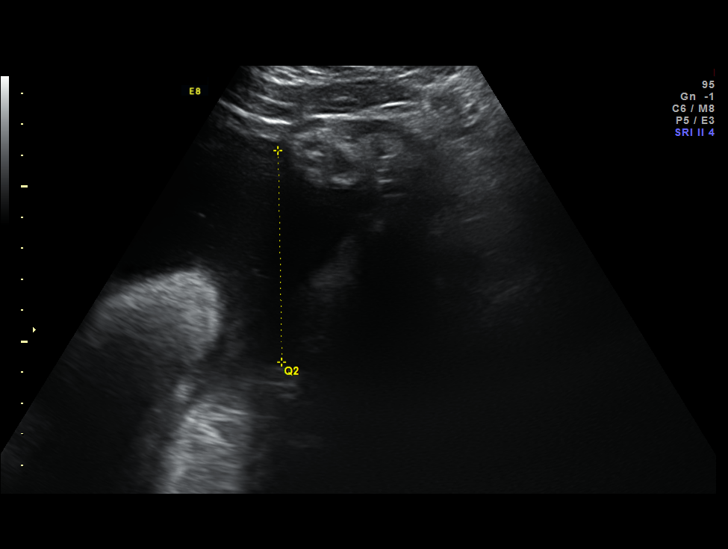
[im 25/33]
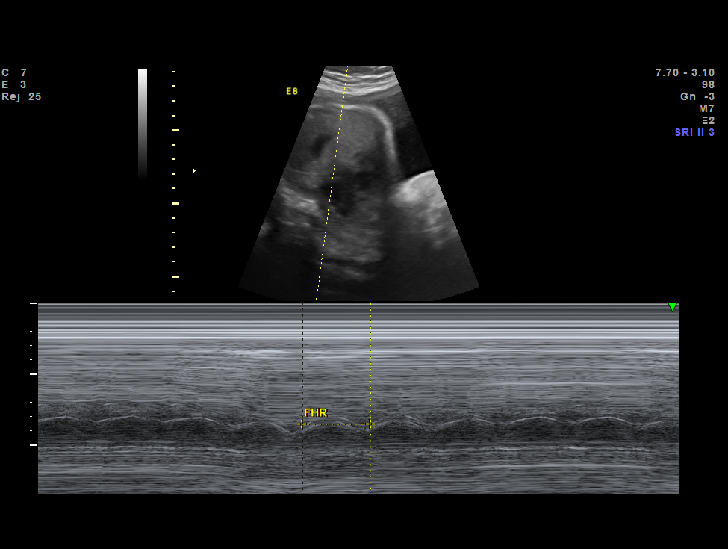
[im 28/33]
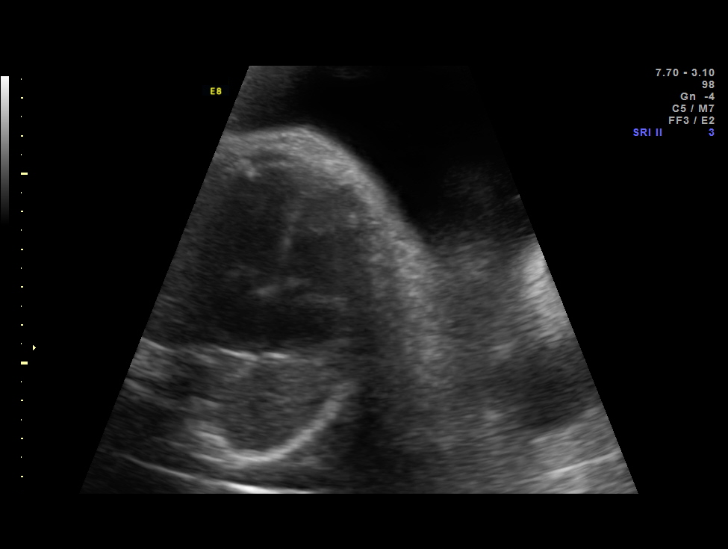
[im 30/33]
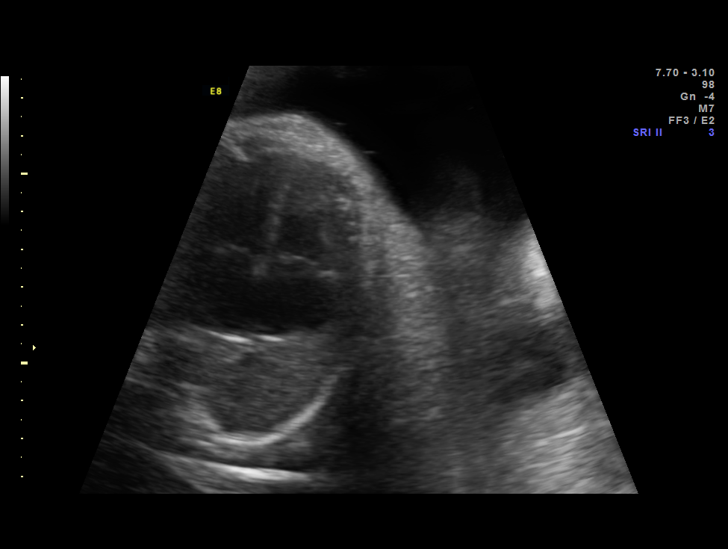
[im 33/33]
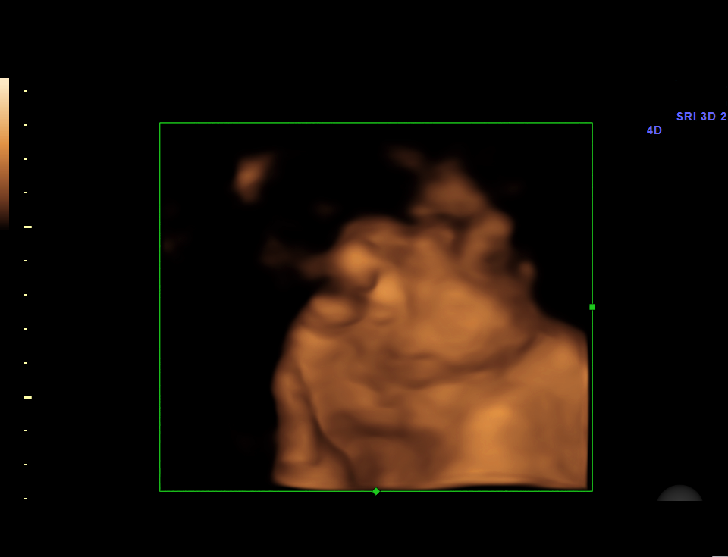

[14 of 28 positions shown; findings below may reference images not displayed]

Canned report from images found in remote index.

Refer to host system for actual result text.

## 2013-11-26 ENCOUNTER — Ambulatory Visit: Payer: Medicare Other | Attending: Internal Medicine | Admitting: Internal Medicine

## 2013-11-26 ENCOUNTER — Encounter: Payer: Self-pay | Admitting: Internal Medicine

## 2013-11-26 VITALS — BP 171/114 | HR 80 | Temp 98.5°F | Resp 16 | Wt 210.0 lb

## 2013-11-26 DIAGNOSIS — E785 Hyperlipidemia, unspecified: Secondary | ICD-10-CM

## 2013-11-26 DIAGNOSIS — F17200 Nicotine dependence, unspecified, uncomplicated: Secondary | ICD-10-CM

## 2013-11-26 DIAGNOSIS — I10 Essential (primary) hypertension: Secondary | ICD-10-CM

## 2013-11-26 DIAGNOSIS — Z139 Encounter for screening, unspecified: Secondary | ICD-10-CM

## 2013-11-26 DIAGNOSIS — E119 Type 2 diabetes mellitus without complications: Secondary | ICD-10-CM

## 2013-11-26 DIAGNOSIS — Z23 Encounter for immunization: Secondary | ICD-10-CM

## 2013-11-26 DIAGNOSIS — IMO0001 Reserved for inherently not codable concepts without codable children: Secondary | ICD-10-CM | POA: Insufficient documentation

## 2013-11-26 DIAGNOSIS — F319 Bipolar disorder, unspecified: Secondary | ICD-10-CM | POA: Insufficient documentation

## 2013-11-26 LAB — GLUCOSE, POCT (MANUAL RESULT ENTRY): POC Glucose: 315 mg/dl — AB (ref 70–99)

## 2013-11-26 MED ORDER — LISINOPRIL-HYDROCHLOROTHIAZIDE 20-12.5 MG PO TABS: 1.0000 | ORAL_TABLET | Freq: Every day | ORAL | Status: DC

## 2013-11-26 MED ORDER — NICOTINE 21 MG/24HR TD PT24: 21.0000 mg | MEDICATED_PATCH | Freq: Every day | TRANSDERMAL | Status: DC

## 2013-11-26 MED ORDER — METFORMIN HCL 1000 MG PO TABS: 1000.0000 mg | ORAL_TABLET | Freq: Two times a day (BID) | ORAL | Status: DC

## 2013-11-26 MED ORDER — INSULIN DETEMIR 100 UNIT/ML ~~LOC~~ SOLN: 10.0000 [IU] | Freq: Two times a day (BID) | SUBCUTANEOUS | Status: DC

## 2013-11-26 NOTE — Progress Notes (Signed)
Patient Demographics  Lainee Lehrman, is a 42 y.o. female  WJX:[id number]  OZH:[id number]  DOB - 05-27-1971  CC:  Chief Complaint  Patient presents with  . Establish Care  . Hypertension  . Diabetes       HPI: Wavie Hashimi is a 43 y.o. female here today to establish medical care. She is history of diabetes hypertension hyperlipidemia bipolar disorder currently following up with the psychiatrist not on any medications denies any SI or HI, she has not been on any medications for the last 6 months reported to have urinary frequency polyuria polydipsia denies any dysuria denies any fever chills used to follow up with the ophthalmologist had occasional blurry vision. Patient has No headache, No chest pain, No abdominal pain - No Nausea, No new weakness tingling or numbness, No Cough - SOB.  Allergies  Allergen Reactions  . Sulfa Antibiotics Hives and Itching  . Strawberry Hives   Past Medical History  Diagnosis Date  . Fibroid   . Preterm labor   . Herpes   . Bipolar 1 disorder   . Hypertension     on aldomet  . Diabetes mellitus     nph 20U qam and qpm, regular with meals  . Diabetes in pregnancy   . Headache(784.0)   . Depression     recently added wellbutrin-has not taken yet for bipolar  . H/O varicella   . Abnormal Pap smear 1998  . Trichomonas   . Yeast infection   . Bacterial infection   . Galactorrhea of right breast 2008  . Oligomenorrhea 07/2007  . Candida vaginitis 07/2007  . H/O amenorrhea 07/2007  . Hx: UTI (urinary tract infection) 2009  . Pelvic pain in female   . HSV-2 infection 01/03/2009  . H/O dysmenorrhea 2010  . Increased BMI 2010  . Heavy vaginal bleeding due to contraceptive injection use 10/12/2011    Depo Provera  . H/O menorrhagia 10/14/11  . Obesity 10/14/11  . H/O dizziness 10/14/11  . Abnormal vaginal bleeding 12/19/2011   Current Outpatient Prescriptions on File Prior to Visit  Medication Sig Dispense Refill  . carbamazepine (TEGRETOL) 200 MG  tablet Take 200 mg by mouth 3 (three) times daily.      [name] doxycycline (VIBRAMYCIN) 100 MG capsule Take 1 capsule (100 mg total) by mouth 2 (two) times daily.  20 capsule  0  . fish oil-omega-3 fatty acids 1000 MG capsule Take 1 g by mouth daily.      [name] lisinopril (PRINIVIL,ZESTRIL) 10 MG tablet Take 10 mg by mouth daily.      . QUEtiapine (SEROQUEL) 25 MG tablet Take 50 mg by mouth at bedtime.      . valACYclovir (VALTREX) 500 MG tablet Take 500 mg by mouth daily.        . [DISCONTINUED] medroxyPROGESTERone (DEPO-PROVERA) 150 MG/ML injection Inject 1 mL (150 mg total) into the muscle every 3 (three) months.  1 mL  0   No current facility-administered medications on file prior to visit.   Family History  Problem Relation Age of Onset  . Hypertension Mother   . Diabetes Mother   . Heart disease Mother   . Hypertension Father   . Diabetes Father   . Heart disease Father   . Other Neg Hx   . Stroke Maternal Grandfather    History   Social History  . Marital Status: Married    Spouse Name: N/A    Number of Children: N/A  . Years of Education:  N/A   Occupational History  . Not on file.   Social History Main Topics  . Smoking status: Current Every Day Smoker -- 0.50 packs/day for 20 years    Types: Cigarettes  . Smokeless tobacco: Never Used  . Alcohol Use: No  . Drug Use: No  . Sexual Activity: Yes    Birth Control/ Protection: IUD   Other Topics Concern  . Not on file   Social History Narrative  . No narrative on file    Review of Systems: Constitutional: Negative for fever, chills, diaphoresis, activity change, appetite change and fatigue. HENT: Negative for ear pain, nosebleeds, congestion, facial swelling, rhinorrhea, neck pain, neck stiffness and ear discharge.  Eyes: Negative for pain, discharge, redness, itching and visual disturbance. Occasional blurry vision Respiratory: Negative for cough, choking, chest tightness, shortness of breath, wheezing and stridor.   Cardiovascular: Negative for chest pain, palpitations and leg swelling. Gastrointestinal: Negative for abdominal distention. Genitourinary: Negative for dysuria, urgency, frequency, hematuria, flank pain, decreased urine volume, difficulty urinating and dyspareunia.  Musculoskeletal: Negative for back pain, joint swelling, arthralgia and gait problem. Neurological: Negative for dizziness, tremors, seizures, syncope, facial asymmetry, speech difficulty, weakness, light-headedness, numbness and headaches.  Hematological: Negative for adenopathy. Does not bruise/bleed easily. Psychiatric/Behavioral: Negative for hallucinations, behavioral problems, confusion, dysphoric mood, decreased concentration and agitation.    Objective:   Filed Vitals:   11/26/13 0921  BP: 171/114  Pulse: 80  Temp: 98.5 F (36.9 C)  Resp: 16    Physical Exam: Constitutional: Patient appears well-developed and well-nourished. No distress. HENT: Normocephalic, atraumatic, External right and left ear normal. Oropharynx is clear and moist.  Eyes: Conjunctivae and EOM are normal. PERRLA, no scleral icterus. Neck: Normal ROM. Neck supple. No JVD. No tracheal deviation. No thyromegaly. CVS: RRR, S1/S2 +, no murmurs, no gallops, no carotid bruit.  Pulmonary: Effort and breath sounds normal, no stridor, rhonchi, wheezes, rales.  Abdominal: Soft. BS +, no distension, tenderness, rebound or guarding.  Musculoskeletal: Normal range of motion. No edema and no tenderness.  Lymphadenopathy: No lymphadenopathy noted, cervical, inguinal or axillary Neuro: Alert. Normal reflexes, muscle tone coordination. No cranial nerve deficit. Skin: Skin is warm and dry. No rash noted. Not diaphoretic. No erythema. No pallor. Psychiatric: Normal mood and affect. Behavior, judgment, thought content normal.  Lab Results  Component Value Date   WBC 7.2 09/10/2011   HGB 13.2 09/10/2011   HCT 39.3 09/10/2011   MCV 88.9 09/10/2011   PLT 265  09/10/2011   Lab Results  Component Value Date   CREATININE 0.56 09/10/2011   BUN 8 09/10/2011   NA 133* 09/10/2011   K 3.9 09/10/2011   CL 98 09/10/2011   CO2 22 09/10/2011    Lab Results  Component Value Date   HGBA1C 13.8% 11/26/2013   Lipid Panel  No results found for this basename: chol, trig, hdl, cholhdl, vldl, ldlcalc       Assessment and plan:   1. DM (diabetes mellitus)  - Glucose (CBG) - HgB A1c 13.8% Diabetes uncontrolled I resume back on Levemir and metformin 1 g twice a day - insulin detemir (LEVEMIR) 100 UNIT/ML injection; Inject 0.1-0.7 mLs (10-70 Units total) into the skin 2 (two) times daily. Takes 10 units in the morning and 70 units at night  Dispense: 10 mL; Refill: 3 - metFORMIN (GLUCOPHAGE) 1000 MG tablet; Take 1 tablet (1,000 mg total) by mouth 2 (two) times daily with a meal.  Dispense: 180 tablet; Refill: 3 - COMPLETE  METABOLIC PANEL WITH GFR; Future  2. HYPERTENSION, BENIGN SYSTEMIC Uncontrolled secondary to noncompliance started on - lisinopril-hydrochlorothiazide (ZESTORETIC) 20-12.5 MG per tablet; Take 1 tablet by mouth daily.  Dispense: 90 tablet; Refill: 3  3. BIPOLAR DISORDER Follow with psychiatrist  4. HYPERLIPIDEMIA Will check her - Lipid panel; Future  5. Smoking Advised to quit smoking we'll try - nicotine (NICODERM CQ) 21 mg/24hr patch; Place 1 patch (21 mg total) onto the skin daily.  Dispense: 28 patch; Refill: 0  6. Needs flu shot Given today  7. Screening  - Vit D  25 hydroxy (rtn osteoporosis monitoring); Future - TSH; Future - CBC with Differential; Future          Health Maintenance Flu shot given today   Follow up in 6 weeks       Tyauna Lacaze, Ayesha Rumpf, MD

## 2013-11-26 NOTE — Patient Instructions (Signed)
2 Gram Low Sodium Diet A 2 gram sodium diet restricts the amount of sodium in the diet to no more than 2 g or 2000 mg daily. Limiting the amount of sodium is often used to help lower blood pressure. It is important if you have heart, liver, or kidney problems. Many foods contain sodium for flavor and sometimes as a preservative. When the amount of sodium in a diet needs to be low, it is important to know what to look for when choosing foods and drinks. The following includes some information and guidelines to help make it easier for you to adapt to a low sodium diet. QUICK TIPS  Do not add salt to food.  Avoid convenience items and fast food.  Choose unsalted snack foods.  Buy lower sodium products, often labeled as "lower sodium" or "no salt added."  Check food labels to learn how much sodium is in 1 serving.  When eating at a restaurant, ask that your food be prepared with less salt or none, if possible. READING FOOD LABELS FOR SODIUM INFORMATION The nutrition facts label is a good place to find how much sodium is in foods. Look for products with no more than 500 to 600 mg of sodium per meal and no more than 150 mg per serving. Remember that 2 g = 2000 mg. The food label may also list foods as:  Sodium-free: Less than 5 mg in a serving.  Very low sodium: 35 mg or less in a serving.  Low-sodium: 140 mg or less in a serving.  Light in sodium: 50% less sodium in a serving. For example, if a food that usually has 300 mg of sodium is changed to become light in sodium, it will have 150 mg of sodium.  Reduced sodium: 25% less sodium in a serving. For example, if a food that usually has 400 mg of sodium is changed to reduced sodium, it will have 300 mg of sodium. CHOOSING FOODS Grains  Avoid: Salted crackers and snack items. Some cereals, including instant hot cereals. Bread stuffing and biscuit mixes. Seasoned rice or pasta mixes.  Choose: Unsalted snack items. Low-sodium cereals, oats,  puffed wheat and rice, shredded wheat. English muffins and bread. Pasta. Meats  Avoid: Salted, canned, smoked, spiced, pickled meats, including fish and poultry. Bacon, ham, sausage, cold cuts, hot dogs, anchovies.  Choose: Low-sodium canned tuna and salmon. Fresh or frozen meat, poultry, and fish. Dairy  Avoid: Processed cheese and spreads. Cottage cheese. Buttermilk and condensed milk. Regular cheese.  Choose: Milk. Low-sodium cottage cheese. Yogurt. Sour cream. Low-sodium cheese. Fruits and Vegetables  Avoid: Regular canned vegetables. Regular canned tomato sauce and paste. Frozen vegetables in sauces. Olives. Pickles. Relishes. Sauerkraut.  Choose: Low-sodium canned vegetables. Low-sodium tomato sauce and paste. Frozen or fresh vegetables. Fresh and frozen fruit. Condiments  Avoid: Canned and packaged gravies. Worcestershire sauce. Tartar sauce. Barbecue sauce. Soy sauce. Steak sauce. Ketchup. Onion, garlic, and table salt. Meat flavorings and tenderizers.  Choose: Fresh and dried herbs and spices. Low-sodium varieties of mustard and ketchup. Lemon juice. Tabasco sauce. Horseradish. SAMPLE 2 GRAM SODIUM MEAL PLAN Breakfast / Sodium (mg)  1 cup low-fat milk / 143 mg  2 slices whole-wheat toast / 270 mg  1 tbs heart-healthy margarine / 153 mg  1 hard-boiled egg / 139 mg  1 small orange / 0 mg Lunch / Sodium (mg)  1 cup raw carrots / 76 mg   cup hummus / 298 mg  1 cup low-fat milk /   143 mg   cup red grapes / 2 mg  1 whole-wheat pita bread / 356 mg Dinner / Sodium (mg)  1 cup whole-wheat pasta / 2 mg  1 cup low-sodium tomato sauce / 73 mg  3 oz lean ground beef / 57 mg  1 small side salad (1 cup raw spinach leaves,  cup cucumber,  cup yellow bell pepper) with 1 tsp olive oil and 1 tsp red wine vinegar / 25 mg Snack / Sodium (mg)  1 container low-fat vanilla yogurt / 107 mg  3 graham cracker squares / 127 mg Nutrient Analysis  Calories: 2033  Protein:  77 g  Carbohydrate: 282 g  Fat: 72 g  Sodium: 1971 mg Document Released: 11/22/2005 Document Revised: 02/14/2012 Document Reviewed: 02/23/2010 [employer] Patient Information 2014 [employer].  

## 2013-11-26 NOTE — Progress Notes (Signed)
Patient here to establish care History of DM and HTn Has not taken any medications in about 6 months

## 2013-12-04 ENCOUNTER — Other Ambulatory Visit: Payer: Self-pay | Admitting: *Deleted

## 2013-12-04 ENCOUNTER — Telehealth: Payer: Self-pay | Admitting: *Deleted

## 2013-12-04 DIAGNOSIS — E119 Type 2 diabetes mellitus without complications: Secondary | ICD-10-CM

## 2013-12-04 MED ORDER — METFORMIN HCL 1000 MG PO TABS: 1000.0000 mg | ORAL_TABLET | Freq: Two times a day (BID) | ORAL | Status: DC

## 2013-12-04 MED ORDER — INSULIN DETEMIR 100 UNIT/ML ~~LOC~~ SOLN: 10.0000 [IU] | Freq: Two times a day (BID) | SUBCUTANEOUS | Status: DC

## 2013-12-04 NOTE — Telephone Encounter (Signed)
Pt called pharmacist and requested another rx for the metformin and levemir. She stated that this was the second time she has lost her scripts. I instructed the pharmacist to tell the pt that this would be the last time we would refill her meds due to irresponsibility. Next time she will have to see the doctor to get a refill.

## 2014-01-14 ENCOUNTER — Ambulatory Visit: Payer: Medicare Other | Admitting: Internal Medicine

## 2014-01-15 ENCOUNTER — Other Ambulatory Visit: Payer: Self-pay | Admitting: Internal Medicine

## 2014-01-18 ENCOUNTER — Ambulatory Visit: Payer: Medicare Other | Admitting: Internal Medicine

## 2014-02-12 ENCOUNTER — Other Ambulatory Visit: Payer: Self-pay | Admitting: Internal Medicine

## 2014-03-15 ENCOUNTER — Other Ambulatory Visit: Payer: Self-pay | Admitting: Internal Medicine

## 2014-05-17 ENCOUNTER — Other Ambulatory Visit: Payer: Self-pay | Admitting: Internal Medicine

## 2014-09-29 ENCOUNTER — Encounter (HOSPITAL_COMMUNITY): Payer: Self-pay | Admitting: Emergency Medicine

## 2014-09-29 ENCOUNTER — Emergency Department (HOSPITAL_COMMUNITY)
Admission: EM | Admit: 2014-09-29 | Discharge: 2014-09-30 | Disposition: A | Discharge status: Home / Self Care | Payer: Medicare Other | Attending: Emergency Medicine | Admitting: Emergency Medicine

## 2014-09-29 DIAGNOSIS — N12 Tubulo-interstitial nephritis, not specified as acute or chronic: Secondary | ICD-10-CM | POA: Insufficient documentation

## 2014-09-29 DIAGNOSIS — I10 Essential (primary) hypertension: Secondary | ICD-10-CM | POA: Insufficient documentation

## 2014-09-29 DIAGNOSIS — Z3202 Encounter for pregnancy test, result negative: Secondary | ICD-10-CM | POA: Insufficient documentation

## 2014-09-29 DIAGNOSIS — R739 Hyperglycemia, unspecified: Secondary | ICD-10-CM

## 2014-09-29 DIAGNOSIS — Z8619 Personal history of other infectious and parasitic diseases: Secondary | ICD-10-CM | POA: Diagnosis not present

## 2014-09-29 DIAGNOSIS — Z720 Tobacco use: Secondary | ICD-10-CM | POA: Diagnosis not present

## 2014-09-29 DIAGNOSIS — Z87440 Personal history of urinary (tract) infections: Secondary | ICD-10-CM | POA: Diagnosis not present

## 2014-09-29 DIAGNOSIS — Z8751 Personal history of pre-term labor: Secondary | ICD-10-CM | POA: Diagnosis not present

## 2014-09-29 DIAGNOSIS — Z791 Long term (current) use of non-steroidal anti-inflammatories (NSAID): Secondary | ICD-10-CM | POA: Insufficient documentation

## 2014-09-29 DIAGNOSIS — R109 Unspecified abdominal pain: Secondary | ICD-10-CM | POA: Diagnosis present

## 2014-09-29 DIAGNOSIS — Z8659 Personal history of other mental and behavioral disorders: Secondary | ICD-10-CM | POA: Diagnosis not present

## 2014-09-29 DIAGNOSIS — E1165 Type 2 diabetes mellitus with hyperglycemia: Secondary | ICD-10-CM | POA: Insufficient documentation

## 2014-09-29 DIAGNOSIS — Z8742 Personal history of other diseases of the female genital tract: Secondary | ICD-10-CM | POA: Diagnosis not present

## 2014-09-29 DIAGNOSIS — E669 Obesity, unspecified: Secondary | ICD-10-CM | POA: Diagnosis not present

## 2014-09-29 LAB — CBC WITH DIFFERENTIAL/PLATELET
BASOS ABS: 0 10*3/uL (ref 0.0–0.1)
BASOS PCT: 0 % (ref 0–1)
Eosinophils Absolute: 0 10*3/uL (ref 0.0–0.7)
Eosinophils Relative: 0 % (ref 0–5)
HCT: 37.5 % (ref 36.0–46.0)
HEMOGLOBIN: 13 g/dL (ref 12.0–15.0)
Lymphocytes Relative: 19 % (ref 12–46)
Lymphs Abs: 1.4 10*3/uL (ref 0.7–4.0)
MCH: 30.8 pg (ref 26.0–34.0)
MCHC: 34.7 g/dL (ref 30.0–36.0)
MCV: 88.9 fL (ref 78.0–100.0)
MONOS PCT: 10 % (ref 3–12)
Monocytes Absolute: 0.7 10*3/uL (ref 0.1–1.0)
NEUTROS ABS: 5 10*3/uL (ref 1.7–7.7)
Neutrophils Relative %: 71 % (ref 43–77)
PLATELETS: 210 10*3/uL (ref 150–400)
RBC: 4.22 MIL/uL (ref 3.87–5.11)
RDW: 11.8 % (ref 11.5–15.5)
WBC: 7.1 10*3/uL (ref 4.0–10.5)

## 2014-09-29 LAB — URINE MICROSCOPIC-ADD ON

## 2014-09-29 LAB — COMPREHENSIVE METABOLIC PANEL
ALK PHOS: 86 U/L (ref 39–117)
ALT: 25 U/L (ref 0–35)
AST: 22 U/L (ref 0–37)
Albumin: 3.2 g/dL — ABNORMAL LOW (ref 3.5–5.2)
Anion gap: 19 — ABNORMAL HIGH (ref 5–15)
BUN: 8 mg/dL (ref 6–23)
CHLORIDE: 92 meq/L — AB (ref 96–112)
CO2: 23 mEq/L (ref 19–32)
Calcium: 9.5 mg/dL (ref 8.4–10.5)
Creatinine, Ser: 0.55 mg/dL (ref 0.50–1.10)
GFR calc Af Amer: >90 mL/min (ref >90)
GFR calc non Af Amer: >90 mL/min (ref >90)
Glucose, Bld: 376 mg/dL — ABNORMAL HIGH (ref 70–99)
POTASSIUM: 3.6 meq/L — AB (ref 3.7–5.3)
SODIUM: 134 meq/L — AB (ref 137–147)
TOTAL PROTEIN: 8.4 g/dL — AB (ref 6.0–8.3)
Total Bilirubin: 0.3 mg/dL (ref 0.3–1.2)

## 2014-09-29 LAB — URINALYSIS, ROUTINE W REFLEX MICROSCOPIC
BILIRUBIN URINE: NEGATIVE
Ketones, ur: 40 mg/dL — AB
Nitrite: NEGATIVE
Protein, ur: 100 mg/dL — AB
SPECIFIC GRAVITY, URINE: 1.022 (ref 1.005–1.030)
Urobilinogen, UA: 1 mg/dL (ref 0.0–1.0)
pH: 5.5 (ref 5.0–8.0)

## 2014-09-29 LAB — LIPASE, BLOOD: Lipase: 30 U/L (ref 11–59)

## 2014-09-29 LAB — CBG MONITORING, ED: Glucose-Capillary: 414 mg/dL — ABNORMAL HIGH (ref 70–99)

## 2014-09-29 LAB — PREGNANCY, URINE: PREG TEST UR: NEGATIVE

## 2014-09-29 MED ORDER — INSULIN ASPART 100 UNIT/ML ~~LOC~~ SOLN
5.0000 [IU] | Freq: Once | SUBCUTANEOUS | Status: AC
  Administered 2014-09-29: 5 [IU] via SUBCUTANEOUS
  Filled 2014-09-29: qty 1

## 2014-09-29 MED ORDER — SODIUM CHLORIDE 0.9 % IV BOLUS (SEPSIS)
1000.0000 mL | Freq: Once | INTRAVENOUS | Status: AC
  Administered 2014-09-29: 1000 mL via INTRAVENOUS

## 2014-09-29 MED ORDER — ONDANSETRON 4 MG PO TBDP
8.0000 mg | ORAL_TABLET | Freq: Once | ORAL | Status: AC
  Administered 2014-09-29: 8 mg via ORAL
  Filled 2014-09-29: qty 2

## 2014-09-29 MED ORDER — ONDANSETRON HCL 4 MG/2ML IJ SOLN
4.0000 mg | Freq: Once | INTRAMUSCULAR | Status: AC
  Administered 2014-09-29: 4 mg via INTRAVENOUS
  Filled 2014-09-29: qty 2

## 2014-09-29 MED ORDER — CEFTRIAXONE SODIUM 1 G IJ SOLR
1.0000 g | Freq: Once | INTRAMUSCULAR | Status: AC
  Administered 2014-09-29: 1 g via INTRAVENOUS
  Filled 2014-09-29: qty 10

## 2014-09-29 MED ORDER — MORPHINE SULFATE 4 MG/ML IJ SOLN
4.0000 mg | Freq: Once | INTRAMUSCULAR | Status: AC
  Administered 2014-09-29: 4 mg via INTRAVENOUS
  Filled 2014-09-29: qty 1

## 2014-09-29 NOTE — ED Notes (Signed)
[id number] 

## 2014-09-29 NOTE — ED Notes (Signed)
The pt has ha nausea and vomiting for 5 days with abd pain and her urine is   Cloudy.  lmp oct 10th

## 2014-09-30 LAB — CBG MONITORING, ED
Glucose-Capillary: 321 mg/dL — ABNORMAL HIGH (ref 70–99)
Glucose-Capillary: 260 mg/dL — ABNORMAL HIGH (ref 70–99)

## 2014-09-30 MED ORDER — ONDANSETRON HCL 4 MG PO TABS: 4.0000 mg | ORAL_TABLET | Freq: Four times a day (QID) | ORAL | Status: DC

## 2014-09-30 MED ORDER — INSULIN ASPART 100 UNIT/ML ~~LOC~~ SOLN
10.0000 [IU] | Freq: Once | SUBCUTANEOUS | Status: AC
Start: 2014-09-30 — End: 2014-09-30
  Administered 2014-09-30: 10 [IU] via SUBCUTANEOUS
  Filled 2014-09-30: qty 1

## 2014-09-30 MED ORDER — SODIUM CHLORIDE 0.9 % IV BOLUS (SEPSIS)
1000.0000 mL | Freq: Once | INTRAVENOUS | Status: AC
  Administered 2014-09-30: 1000 mL via INTRAVENOUS

## 2014-09-30 MED ORDER — IBUPROFEN 200 MG PO TABS
600.0000 mg | ORAL_TABLET | Freq: Once | ORAL | Status: AC
  Administered 2014-09-30: 600 mg via ORAL
  Filled 2014-09-30: qty 3

## 2014-09-30 MED ORDER — CIPROFLOXACIN HCL 500 MG PO TABS: 500.0000 mg | ORAL_TABLET | Freq: Two times a day (BID) | ORAL | Status: DC

## 2014-09-30 MED ORDER — SODIUM CHLORIDE 0.9 % IV BOLUS (SEPSIS): 500.0000 mL | Freq: Once | INTRAVENOUS | Status: DC

## 2014-09-30 NOTE — ED Provider Notes (Signed)
0235 - Patient care assumed from Delos Haring, PA-C at shift change. Patient dx with pyelonephritis. Found to be hyperglycemic on arrival. Plan discussed with Carlota Raspberry, PA-C which includes discharge when hyperglycemia has improved. Sugars improved to 260. Patient stable for discharge with Rx for Zofran and Ciprofloxacin. Patient advised to resume diabetic medications. Return precautions provided. Patient discharged in good condition; VSS.  Filed Vitals:   09/29/14 1852 09/29/14 2244 09/29/14 2341 09/30/14 0230  BP: 141/77 129/81 129/83 116/74  Pulse: 114 110 96 102  Temp: 99.2 F (37.3 C)   100 F (37.8 C)  TempSrc: Oral   Oral  Resp: 18 15 16 21   Height: 5\' 4"  (1.626 m)     Weight: 202 lb (91.627 kg)     SpO2: 100% 100% 100% 100%     Antonietta Breach, PA-C 09/30/14 [phone]

## 2014-09-30 NOTE — Discharge Instructions (Signed)
Blood Glucose Monitoring °Monitoring your blood glucose (also know as blood sugar) helps you to manage your diabetes. It also helps you and your health care provider monitor your diabetes and determine how well your treatment plan is working. °WHY SHOULD YOU MONITOR YOUR BLOOD GLUCOSE? °· It can help you understand how food, exercise, and medicine affect your blood glucose. °· It allows you to know what your blood glucose is at any given moment. You can quickly tell if you are having low blood glucose (hypoglycemia) or high blood glucose (hyperglycemia). °· It can help you and your health care provider know how to adjust your medicines. °· It can help you understand how to manage an illness or adjust medicine for exercise. °WHEN SHOULD YOU TEST? °Your health care provider will help you decide how often you should check your blood glucose. This may depend on the type of diabetes you have, your diabetes control, or the types of medicines you are taking. Be sure to write down all of your blood glucose readings so that this information can be reviewed with your health care provider. See below for examples of testing times that your health care provider may suggest. °Type 1 Diabetes °· Test 4 times a day if you are in good control, using an insulin pump, or perform multiple daily injections. °· If your diabetes is not well controlled or if you are sick, you may need to monitor more often. °· It is a good idea to also monitor: °¨ Before and after exercise. °¨ Between meals and 2 hours after a meal. °¨ Occasionally between 2:00 a.m. and 3:00 a.m. °Type 2 Diabetes °· It can vary with each person, but generally, if you are on insulin, test 4 times a day. °· If you take medicines by mouth (orally), test 2 times a day. °· If you are on a controlled diet, test once a day. °· If your diabetes is not well controlled or if you are sick, you may need to monitor more often. °HOW TO MONITOR YOUR BLOOD GLUCOSE °Supplies  Needed °· Blood glucose meter. °· Test strips for your meter. Each meter has its own strips. You must use the strips that go with your own meter. °· A pricking needle (lancet). °· A device that holds the lancet (lancing device). °· A journal or log book to write down your results. °Procedure °· Wash your hands with soap and water. Alcohol is not preferred. °· Prick the side of your finger (not the tip) with the lancet. °· Gently milk the finger until a small drop of blood appears. °· Follow the instructions that come with your meter for inserting the test strip, applying blood to the strip, and using your blood glucose meter. °Other Areas to Get Blood for Testing °Some meters allow you to use other areas of your body (other than your finger) to test your blood. These areas are called alternative sites. The most common alternative sites are: °· The forearm. °· The thigh. °· The back area of the lower leg. °· The palm of the hand. °The blood flow in these areas is slower. Therefore, the blood glucose values you get may be delayed, and the numbers are different from what you would get from your fingers. Do not use alternative sites if you think you are having hypoglycemia. Your reading will not be accurate. Always use a finger if you are having hypoglycemia. Also, if you cannot feel your lows (hypoglycemia unawareness), always use your fingers for your   blood glucose checks. ADDITIONAL TIPS FOR GLUCOSE MONITORING  Do not reuse lancets.  Always carry your supplies with you.  All blood glucose meters have a 24-hour "hotline" number to call if you have questions or need help.  Adjust (calibrate) your blood glucose meter with a control solution after finishing a few boxes of strips. BLOOD GLUCOSE RECORD KEEPING It is a good idea to keep a daily record or log of your blood glucose readings. Most glucose meters, if not all, keep your glucose records stored in the meter. Some meters come with the ability to download  your records to your home computer. Keeping a record of your blood glucose readings is especially helpful if you are wanting to look for patterns. Make notes to go along with the blood glucose readings because you might forget what happened at that exact time. Keeping good records helps you and your health care provider to work together to achieve good diabetes management.  Document Released: 11/25/2003 Document Revised: 04/08/2014 Document Reviewed: 04/16/2013 [facility] Patient Information 2015 [place], [place]. This information is not intended to replace advice given to you by your health care provider. Make sure you discuss any questions you have with your health care provider.  Pyelonephritis, Adult Pyelonephritis is a kidney infection. In general, there are 2 main types of pyelonephritis:  Infections that come on quickly without any warning (acute pyelonephritis).  Infections that persist for a long period of time (chronic pyelonephritis). CAUSES  Two main causes of pyelonephritis are:  Bacteria traveling from the bladder to the kidney. This is a problem especially in pregnant women. The urine in the bladder can become filled with bacteria from multiple causes, including:  Inflammation of the prostate gland (prostatitis).  Sexual intercourse in females.  Bladder infection (cystitis).  Bacteria traveling from the bloodstream to the tissue part of the kidney. Problems that may increase your risk of getting a kidney infection include:  Diabetes.  Kidney stones or bladder stones.  Cancer.  Catheters placed in the bladder.  Other abnormalities of the kidney or ureter. SYMPTOMS   Abdominal pain.  Pain in the side or flank area.  Fever.  Chills.  Upset stomach.  Blood in the urine (dark urine).  Frequent urination.  Strong or persistent urge to urinate.  Burning or stinging when urinating. DIAGNOSIS  Your caregiver may diagnose your kidney infection based on your  symptoms. A urine sample may also be taken. TREATMENT  In general, treatment depends on how severe the infection is.   If the infection is mild and caught early, your caregiver may treat you with oral antibiotics and send you home.  If the infection is more severe, the bacteria may have gotten into the bloodstream. This will require intravenous (IV) antibiotics and a hospital stay. Symptoms may include:  High fever.  Severe flank pain.  Shaking chills.  Even after a hospital stay, your caregiver may require you to be on oral antibiotics for a period of time.  Other treatments may be required depending upon the cause of the infection. HOME CARE INSTRUCTIONS   Take your antibiotics as directed. Finish them even if you start to feel better.  Make an appointment to have your urine checked to make sure the infection is gone.  Drink enough fluids to keep your urine clear or pale yellow.  Take medicines for the bladder if you have urgency and frequency of urination as directed by your caregiver. SEEK IMMEDIATE MEDICAL CARE IF:   You have a fever  or persistent symptoms for more than 2-3 days.  You have a fever and your symptoms suddenly get worse.  You are unable to take your antibiotics or fluids.  You develop shaking chills.  You experience extreme weakness or fainting.  There is no improvement after 2 days of treatment. MAKE SURE YOU:  Understand these instructions.  Will watch your condition.  Will get help right away if you are not doing well or get worse. Document Released: 11/22/2005 Document Revised: 05/23/2012 Document Reviewed: 04/28/2011 [facility] Patient Information 2015 [place], [place]. This information is not intended to replace advice given to you by your health care provider. Make sure you discuss any questions you have with your health care provider.

## 2014-09-30 NOTE — ED Provider Notes (Signed)
Medical screening examination/treatment/procedure(s) were performed by non-physician practitioner and as supervising physician I was immediately available for consultation/collaboration.   EKG Interpretation None        Mariea Clonts, MD 09/30/14 [phone]

## 2014-09-30 NOTE — ED Provider Notes (Signed)
CSN: [id number]     Arrival date & time 09/29/14  1844 History   First MD Initiated Contact with Patient 09/29/14 2144     Chief Complaint  Patient presents with  . Abdominal Pain     (Consider location/radiation/quality/duration/timing/severity/associated sxs/prior Treatment) HPI  Patient to hte ER with complaints of nausea, vomiting and flank pain bilateral (R>L) for the past five days with cloudy urine. She reports having history of UTI's but never having these symptoms before. She denies having any episodes of vomiting since being in the ED. She denies having weakness, confusions, fevers ,diarrhea, diffuse belly pain, neck pain, cough, sore throat. She says that she has a hx of bipolar disorder, fibroids, herpes, bipolar 1 disorder, diabetes mellitus, headache, and depression.   Past Medical History  Diagnosis Date  . Fibroid   . Preterm labor   . Herpes   . Bipolar 1 disorder   . Hypertension     on aldomet  . Diabetes mellitus     nph 20U qam and qpm, regular with meals  . Diabetes in pregnancy   . Headache(784.0)   . Depression     recently added wellbutrin-has not taken yet for bipolar  . H/O varicella   . Abnormal Pap smear 1998  . Trichomonas   . Yeast infection   . Bacterial infection   . Galactorrhea of right breast 2008  . Oligomenorrhea 07/2007  . Candida vaginitis 07/2007  . H/O amenorrhea 07/2007  . Hx: UTI (urinary tract infection) 2009  . Pelvic pain in female   . HSV-2 infection 01/03/2009  . H/O dysmenorrhea 2010  . Increased BMI 2010  . Heavy vaginal bleeding due to contraceptive injection use 10/12/2011    Depo Provera  . H/O menorrhagia 10/14/11  . Obesity 10/14/11  . H/O dizziness 10/14/11  . Abnormal vaginal bleeding 12/19/2011   Past Surgical History  Procedure Laterality Date  . Cesarean section  1991   Family History  Problem Relation Age of Onset  . Hypertension Mother   . Diabetes Mother   . Heart disease Mother   . Hypertension Father    . Diabetes Father   . Heart disease Father   . Other Neg Hx   . Stroke Maternal Grandfather    History  Substance Use Topics  . Smoking status: Current Every Day Smoker -- 0.50 packs/day for 20 years    Types: Cigarettes  . Smokeless tobacco: Never Used  . Alcohol Use: No   OB History   Grav Para Term Preterm Abortions TAB SAB Ect Mult Living   5 4 1 3 1 1  0 0 0 4     Review of Systems  10 Systems reviewed and are negative for acute change except as noted in the HPI.    Allergies  Sulfa antibiotics and Strawberry  Home Medications   Prior to Admission medications   Medication Sig Start Date End Date Taking? Authorizing Provider  naproxen sodium (ALEVE) 220 MG tablet Take 440 mg by mouth 2 (two) times daily as needed (pain).   Yes Historical Provider, MD   BP 129/83  Pulse 96  Temp(Src) 99.2 F (37.3 C) (Oral)  Resp 16  Ht 5\' 4"  (1.626 m)  Wt 202 lb (91.627 kg)  BMI 34.66 kg/m2  SpO2 100%  LMP 09/14/2014 Physical Exam  Nursing note and vitals reviewed. Constitutional: She appears well-developed and well-nourished. No distress.  HENT:  Head: Normocephalic and atraumatic.  Eyes: Pupils are equal, round, and reactive  to light.  Neck: Normal range of motion. Neck supple.  Cardiovascular: Normal rate and regular rhythm.   Pulmonary/Chest: Effort normal.  Abdominal: Soft. Bowel sounds are normal. There is tenderness. There is CVA tenderness (bilateral). There is no rebound and no guarding.  Neurological: She is alert.  Skin: Skin is warm and dry.    ED Course  Procedures (including critical care time) Labs Review Labs Reviewed  COMPREHENSIVE METABOLIC PANEL - Abnormal; Notable for the following:    Sodium 134 (*)    Potassium 3.6 (*)    Chloride 92 (*)    Glucose, Bld 376 (*)    Total Protein 8.4 (*)    Albumin 3.2 (*)    Anion gap 19 (*)    All other components within normal limits  URINALYSIS, ROUTINE W REFLEX MICROSCOPIC - Abnormal; Notable for the  following:    APPearance CLOUDY (*)    Glucose, UA >1000 (*)    Hgb urine dipstick MODERATE (*)    Ketones, ur 40 (*)    Protein, ur 100 (*)    Leukocytes, UA SMALL (*)    All other components within normal limits  URINE MICROSCOPIC-ADD ON - Abnormal; Notable for the following:    Bacteria, UA MANY (*)    All other components within normal limits  CBG MONITORING, ED - Abnormal; Notable for the following:    Glucose-Capillary 414 (*)    All other components within normal limits  CBC WITH DIFFERENTIAL  LIPASE, BLOOD  PREGNANCY, URINE  CBG MONITORING, ED    Imaging Review No results found.   EKG Interpretation None      MDM   Final diagnoses:  Hyperglycemia  Pyelonephritis    Medications  insulin aspart (novoLOG) injection 10 Units (not administered)  sodium chloride 0.9 % bolus 1,000 mL (1,000 mLs Intravenous New Bag/Given 09/30/14 0110)  ondansetron (ZOFRAN-ODT) disintegrating tablet 8 mg (8 mg Oral Given 09/29/14 1858)  sodium chloride 0.9 % bolus 1,000 mL (0 mLs Intravenous Stopped 09/30/14 0059)  cefTRIAXone (ROCEPHIN) 1 g in dextrose 5 % 50 mL IVPB (0 g Intravenous Stopped 09/30/14 0003)  morphine 4 MG/ML injection 4 mg (4 mg Intravenous Given 09/29/14 2327)  ondansetron (ZOFRAN) injection 4 mg (4 mg Intravenous Given 09/29/14 2325)  insulin aspart (novoLOG) injection 5 Units (5 Units Subcutaneous Given 09/29/14 2322)     Patient tells me that she has not been taking her blood pressure medications or diabetes medications; Metformin, insulin, or Lisinopril. She said she has not been taking it because she doesn't know how much insulin to take and hasnt taken anything else with it because she usually takes her medications together. She does not have glucometer to know what dose of insulin to take. I have advised her to continue her non insulin medications, especially metformin.   I have consulted Case Management to help her get a glucometer, or give suggestions. She  has a UTI and clinically pyelo. Her vitals improved significantly with fluids. She receive abx, pain medications, nausea medications and insulin. She will not need admission from this visit due to tolerating fluids and looking well.   Repeat CBG on discharge is 321.   Will order 10 units of insulin and another liter of fluids, At the end of shift patient signed out to [employer], PA-C. Patient has pyelo but from that standpoint is clear to go home at this time, unless her clinical picture changes.   Her glucose was incidentally found to be elevated. She  does not appear to be DKA, slightly elevated anion gap but likely has resolved with fluids.  Plan is to recheck glucose after 10 more units of regular insulin and 1 liter of normal saline. Target goal for glucose is 250.   Filed Vitals:   09/29/14 2341  BP: 129/83  Pulse: 96  Temp:   Resp: [address], PA-C 09/30/14 0112

## 2014-09-30 NOTE — ED Notes (Signed)
Patient discharged, prescriptions given voiced understanding.

## 2014-10-01 ENCOUNTER — Other Ambulatory Visit: Payer: Self-pay | Admitting: Internal Medicine

## 2014-10-01 NOTE — ED Provider Notes (Signed)
Medical screening examination/treatment/procedure(s) were performed by non-physician practitioner and as supervising physician I was immediately available for consultation/collaboration.   EKG Interpretation None        Debby Freiberg, MD 10/01/14 0140

## 2014-10-02 LAB — URINE CULTURE

## 2014-10-03 ENCOUNTER — Telehealth (HOSPITAL_BASED_OUTPATIENT_CLINIC_OR_DEPARTMENT_OTHER): Payer: Self-pay | Admitting: Emergency Medicine

## 2014-10-03 NOTE — Telephone Encounter (Signed)
Post ED Visit - Positive Culture Follow-up  Culture report reviewed by antimicrobial stewardship pharmacist: []  Wes Dulaney, Pharm.D., BCPS []  Heide Guile, Pharm.D., BCPS [x]  Alycia Rossetti, Pharm.D., BCPS []  [place], Pharm.D., BCPS, AAHIVP []  Legrand Como, Pharm.D., BCPS, AAHIVP []  Carly Sabat, Pharm.D. []  Elenor Quinones, Pharm.D.  Positive urine culture Treated with cipro 500mg  po caps bid x 14 days, organism sensitive to the same and no further patient follow-up is required at this time.  Hazle Nordmann 10/03/2014, 6:41 PM

## 2014-10-07 ENCOUNTER — Encounter (HOSPITAL_COMMUNITY): Payer: Self-pay | Admitting: Emergency Medicine

## 2014-12-19 ENCOUNTER — Encounter (HOSPITAL_COMMUNITY): Payer: Self-pay | Admitting: Obstetrics and Gynecology

## 2015-01-30 ENCOUNTER — Encounter: Payer: Self-pay | Admitting: Internal Medicine

## 2015-01-30 ENCOUNTER — Ambulatory Visit: Payer: Medicare Other | Attending: Internal Medicine | Admitting: Internal Medicine

## 2015-01-30 VITALS — BP 160/80 | HR 89 | Temp 97.6°F | Resp 16

## 2015-01-30 DIAGNOSIS — Z792 Long term (current) use of antibiotics: Secondary | ICD-10-CM | POA: Insufficient documentation

## 2015-01-30 DIAGNOSIS — R350 Frequency of micturition: Secondary | ICD-10-CM | POA: Insufficient documentation

## 2015-01-30 DIAGNOSIS — L298 Other pruritus: Secondary | ICD-10-CM | POA: Diagnosis not present

## 2015-01-30 DIAGNOSIS — Z794 Long term (current) use of insulin: Secondary | ICD-10-CM | POA: Insufficient documentation

## 2015-01-30 DIAGNOSIS — I10 Essential (primary) hypertension: Secondary | ICD-10-CM | POA: Diagnosis not present

## 2015-01-30 DIAGNOSIS — R030 Elevated blood-pressure reading, without diagnosis of hypertension: Secondary | ICD-10-CM

## 2015-01-30 DIAGNOSIS — E1165 Type 2 diabetes mellitus with hyperglycemia: Secondary | ICD-10-CM | POA: Insufficient documentation

## 2015-01-30 DIAGNOSIS — F17210 Nicotine dependence, cigarettes, uncomplicated: Secondary | ICD-10-CM | POA: Diagnosis not present

## 2015-01-30 DIAGNOSIS — IMO0001 Reserved for inherently not codable concepts without codable children: Secondary | ICD-10-CM

## 2015-01-30 DIAGNOSIS — E139 Other specified diabetes mellitus without complications: Secondary | ICD-10-CM

## 2015-01-30 DIAGNOSIS — N898 Other specified noninflammatory disorders of vagina: Secondary | ICD-10-CM

## 2015-01-30 LAB — POCT GLYCOSYLATED HEMOGLOBIN (HGB A1C): Hemoglobin A1C: 10.5

## 2015-01-30 MED ORDER — METFORMIN HCL 1000 MG PO TABS: 1000.0000 mg | ORAL_TABLET | Freq: Two times a day (BID) | ORAL | Status: DC

## 2015-01-30 MED ORDER — FREESTYLE SYSTEM KIT: 1.0000 | PACK | Freq: Three times a day (TID) | Status: DC

## 2015-01-30 MED ORDER — INSULIN DETEMIR 100 UNIT/ML ~~LOC~~ SOLN: SUBCUTANEOUS | Status: DC

## 2015-01-30 MED ORDER — FLUCONAZOLE 150 MG PO TABS: 150.0000 mg | ORAL_TABLET | Freq: Once | ORAL | Status: DC

## 2015-01-30 MED ORDER — CLONIDINE HCL 0.1 MG PO TABS
0.2000 mg | ORAL_TABLET | Freq: Once | ORAL | Status: AC
  Administered 2015-01-30: 0.2 mg via ORAL

## 2015-01-30 MED ORDER — LISINOPRIL-HYDROCHLOROTHIAZIDE 10-12.5 MG PO TABS: 1.0000 | ORAL_TABLET | Freq: Every day | ORAL | Status: DC

## 2015-01-30 NOTE — Progress Notes (Signed)
MRN: [id number] Name: Sarah Long  Sex: female Age: 44 y.o. DOB: 1971/01/25  Allergies: Sulfa antibiotics and Strawberry  Chief Complaint  Patient presents with  . Follow-up    HPI: Patient is 44 y.o. female who has to of diabetes hypertension, comes today for followup as per patient she ran out of her medications and could not afford because of insurance issues, today her blood pressure is elevated, she's given clonidine and her repeat manual blood pressure is 160/80, as per patient in the past she was also treated for UTI and occasionally has urinary frequency, denies any fever chills dysuria, hemoglobin A1c has trended down compared to last visit. Patient was on Levemir as well as metformin.patient is also complaining of vaginal itching as per patient she frequently gets yeast infections.  Past Medical History  Diagnosis Date  . Fibroid   . Preterm labor   . Herpes   . Bipolar 1 disorder   . Hypertension     on aldomet  . Diabetes mellitus     nph 20U qam and qpm, regular with meals  . Diabetes in pregnancy   . Headache(784.0)   . Depression     recently added wellbutrin-has not taken yet for bipolar  . H/O varicella   . Abnormal Pap smear 1998  . Trichomonas   . Yeast infection   . Bacterial infection   . Galactorrhea of right breast 2008  . Oligomenorrhea 07/2007  . Candida vaginitis 07/2007  . H/O amenorrhea 07/2007  . Hx: UTI (urinary tract infection) 2009  . Pelvic pain in female   . HSV-2 infection 01/03/2009  . H/O dysmenorrhea 2010  . Increased BMI 2010  . Heavy vaginal bleeding due to contraceptive injection use 10/12/2011    Depo Provera  . H/O menorrhagia 10/14/11  . Obesity 10/14/11  . H/O dizziness 10/14/11  . Abnormal vaginal bleeding 12/19/2011    Past Surgical History  Procedure Laterality Date  . Cesarean section  1991      Medication List       This list is accurate as of: 01/30/15 10:23 AM.  Always use your most recent med list.                 ALEVE 220 MG tablet  Generic drug:  naproxen sodium  Take 440 mg by mouth 2 (two) times daily as needed (pain).     ciprofloxacin 500 MG tablet  Commonly known as:  CIPRO  Take 1 tablet (500 mg total) by mouth 2 (two) times daily.     fluconazole 150 MG tablet  Commonly known as:  DIFLUCAN  Take 1 tablet (150 mg total) by mouth once.     glucose monitoring kit monitoring kit  1 each by Does not apply route 4 (four) times daily - after meals and at bedtime. 1 month Diabetic Testing Supplies for QAC-QHS accuchecks.     LEVEMIR [place]  Inject into the skin.     insulin detemir 100 UNIT/ML injection  Commonly known as:  LEVEMIR  INJECT 10 UNITS EVERY MORNING AND 70 UNITS AT NIGHT     lisinopril-hydrochlorothiazide 10-12.5 MG per tablet  Commonly known as:  PRINZIDE,ZESTORETIC  Take 1 tablet by mouth daily.     METFORMIN HCL PO  Take by mouth.     metFORMIN 1000 MG tablet  Commonly known as:  GLUCOPHAGE  Take 1 tablet (1,000 mg total) by mouth 2 (two) times daily with a meal.  ondansetron 4 MG tablet  Commonly known as:  ZOFRAN  Take 1 tablet (4 mg total) by mouth every 6 (six) hours.        Meds ordered this encounter  Medications  . METFORMIN HCL PO    Sig: Take by mouth.  . Insulin Detemir (LEVEMIR [place])    Sig: Inject into the skin.  . cloNIDine (CATAPRES) tablet 0.2 mg    Sig:   . insulin detemir (LEVEMIR) 100 UNIT/ML injection    Sig: INJECT 10 UNITS EVERY MORNING AND 70 UNITS AT NIGHT    Dispense:  10 mL    Refill:  11  . metFORMIN (GLUCOPHAGE) 1000 MG tablet    Sig: Take 1 tablet (1,000 mg total) by mouth 2 (two) times daily with a meal.    Dispense:  180 tablet    Refill:  3  . lisinopril-hydrochlorothiazide (PRINZIDE,ZESTORETIC) 10-12.5 MG per tablet    Sig: Take 1 tablet by mouth daily.    Dispense:  90 tablet    Refill:  3  . glucose monitoring kit (FREESTYLE) monitoring kit    Sig: 1 each by Does not apply route 4 (four) times daily - after  meals and at bedtime. 1 month Diabetic Testing Supplies for QAC-QHS accuchecks.    Dispense:  1 each    Refill:  1  . fluconazole (DIFLUCAN) 150 MG tablet    Sig: Take 1 tablet (150 mg total) by mouth once.    Dispense:  1 tablet    Refill:  0    Immunization History  Administered Date(s) Administered  . Influenza,inj,Quad PF,36+ Mos 11/26/2013  . Tdap 07/22/2011, 04/29/2012    Family History  Problem Relation Age of Onset  . Hypertension Mother   . Diabetes Mother   . Heart disease Mother   . Hypertension Father   . Diabetes Father   . Heart disease Father   . Other Neg Hx   . Stroke Maternal Grandfather     History  Substance Use Topics  . Smoking status: Current Every Day Smoker -- 0.50 packs/day for 20 years    Types: Cigarettes  . Smokeless tobacco: Never Used  . Alcohol Use: No    Review of Systems   As noted in HPI  Filed Vitals:   01/30/15 0954  BP: 160/80  Pulse:   Temp:   Resp:     Physical Exam  Physical Exam  Constitutional: No distress.  Eyes: EOM are normal. Pupils are equal, round, and reactive to light.  Cardiovascular: Normal rate and regular rhythm.   Pulmonary/Chest: Breath sounds normal. No respiratory distress. She has no wheezes. She has no rales.  Abdominal: Soft. There is no tenderness. There is no rebound.  Musculoskeletal: She exhibits no edema.    CBC    Component Value Date/Time   WBC 7.1 09/29/2014 1855   RBC 4.22 09/29/2014 1855   HGB 13.0 09/29/2014 1855   HCT 37.5 09/29/2014 1855   PLT 210 09/29/2014 1855   MCV 88.9 09/29/2014 1855   LYMPHSABS 1.4 09/29/2014 1855   MONOABS 0.7 09/29/2014 1855   EOSABS 0.0 09/29/2014 1855   BASOSABS 0.0 09/29/2014 1855    CMP     Component Value Date/Time   NA 134* 09/29/2014 1855   K 3.6* 09/29/2014 1855   CL 92* 09/29/2014 1855   CO2 23 09/29/2014 1855   GLUCOSE 376* 09/29/2014 1855   BUN 8 09/29/2014 1855   CREATININE 0.55 09/29/2014 1855   CALCIUM 9.5  09/29/2014  1855   PROT 8.4* 09/29/2014 1855   ALBUMIN 3.2* 09/29/2014 1855   AST 22 09/29/2014 1855   ALT 25 09/29/2014 1855   ALKPHOS 86 09/29/2014 1855   BILITOT 0.3 09/29/2014 1855   GFRNONAA >90 09/29/2014 1855   GFRAA >90 09/29/2014 1855    No results found for: CHOL  No components found for: HGA1C  Lab Results  Component Value Date/Time   AST 22 09/29/2014 06:55 PM    Assessment and Plan  Other specified diabetes mellitus without complications - Plan:  Results for orders placed or performed in visit on 01/30/15  HgB A1c  Result Value Ref Range   Hemoglobin A1C 10.50    And diabetes is still uncontrolled but hemoglobin A1c has trended down compared to last visit, will resume her back on Levemirand metformin, advised patient for diabetes meal planning, prescribed glucometer, she'll keep the fingerstick log, combat in 2 weeks per nurse visit BP check  HgB A1c, insulin detemir (LEVEMIR) 100 UNIT/ML injection, metFORMIN (GLUCOPHAGE) 1000 MG tablet, COMPLETE METABOLIC PANEL WITH GFR, glucose monitoring kit (FREESTYLE) monitoring kit  Elevated blood pressure - Plan: cloNIDine (CATAPRES) tablet 0.2 mg  Urinary frequency - Plan:patient has history of UTI, check Urinalysis  Vaginal itching - Plan: fluconazole (DIFLUCAN) 150 MG tablet  Essential hypertension - Plan:Resume back on lisinopril-hydrochlorothiazide (PRINZIDE,ZESTORETIC) 10-12.5 MG per tablet, also advise patient for DASH diet COMPLETE METABOLIC PANEL WITH GFR   Health Maintenance  -Vaccinations: As per patient he already got the flu shot  Return in about 3 months (around 04/30/2015) for diabetes, hypertension, BP check in 2 weeks/Nurse Visit.   This note has been created with Surveyor, quantity. Any transcriptional errors are unintentional.    Lorayne Marek, MD

## 2015-01-30 NOTE — Progress Notes (Signed)
Patient here for follow up on her diabetes Has not taken any of her medications in months due to the fact She had no insurance Presents in office with elevated blood pressure

## 2015-01-30 NOTE — Patient Instructions (Addendum)
Diabetes Mellitus and Food It is important for you to manage your blood sugar (glucose) level. Your blood glucose level can be greatly affected by what you eat. Eating healthier foods in the appropriate amounts throughout the day at about the same time each day will help you control your blood glucose level. It can also help slow or prevent worsening of your diabetes mellitus. Healthy eating may even help you improve the level of your blood pressure and reach or maintain a healthy weight.  HOW CAN FOOD AFFECT ME? Carbohydrates Carbohydrates affect your blood glucose level more than any other type of food. Your dietitian will help you determine how many carbohydrates to eat at each meal and teach you how to count carbohydrates. Counting carbohydrates is important to keep your blood glucose at a healthy level, especially if you are using insulin or taking certain medicines for diabetes mellitus. Alcohol Alcohol can cause sudden decreases in blood glucose (hypoglycemia), especially if you use insulin or take certain medicines for diabetes mellitus. Hypoglycemia can be a life-threatening condition. Symptoms of hypoglycemia (sleepiness, dizziness, and disorientation) are similar to symptoms of having too much alcohol.  If your health care provider has given you approval to drink alcohol, do so in moderation and use the following guidelines:  Women should not have more than one drink per day, and men should not have more than two drinks per day. One drink is equal to:  12 oz of beer.  5 oz of wine.  1 oz of hard liquor.  Do not drink on an empty stomach.  Keep yourself hydrated. Have water, diet soda, or unsweetened iced tea.  Regular soda, juice, and other mixers might contain a lot of carbohydrates and should be counted. WHAT FOODS ARE NOT RECOMMENDED? As you make food choices, it is important to remember that all foods are not the same. Some foods have fewer nutrients per serving than other  foods, even though they might have the same number of calories or carbohydrates. It is difficult to get your body what it needs when you eat foods with fewer nutrients. Examples of foods that you should avoid that are high in calories and carbohydrates but low in nutrients include:  Trans fats (most processed foods list trans fats on the Nutrition Facts label).  Regular soda.  Juice.  Candy.  Sweets, such as cake, pie, doughnuts, and cookies.  Fried foods. WHAT FOODS CAN I EAT? Have nutrient-rich foods, which will nourish your body and keep you healthy. The food you should eat also will depend on several factors, including:  The calories you need.  The medicines you take.  Your weight.  Your blood glucose level.  Your blood pressure level.  Your cholesterol level. You also should eat a variety of foods, including:  Protein, such as meat, poultry, fish, tofu, nuts, and seeds (lean animal proteins are best).  Fruits.  Vegetables.  Dairy products, such as milk, cheese, and yogurt (low fat is best).  Breads, grains, pasta, cereal, rice, and beans.  Fats such as olive oil, trans fat-free margarine, canola oil, avocado, and olives. DOES EVERYONE WITH DIABETES MELLITUS HAVE THE SAME MEAL PLAN? Because every person with diabetes mellitus is different, there is not one meal plan that works for everyone. It is very important that you meet with a dietitian who will help you create a meal plan that is just right for you. Document Released: 08/19/2005 Document Revised: 11/27/2013 Document Reviewed: 10/19/2013 ExitCare Patient Information 2015 [employer]. This   information is not intended to replace advice given to you by your health care provider. Make sure you discuss any questions you have with your health care provider. DASH Eating Plan DASH stands for "Dietary Approaches to Stop Hypertension." The DASH eating plan is a healthy eating plan that has been shown to reduce high  blood pressure (hypertension). Additional health benefits may include reducing the risk of type 2 diabetes mellitus, heart disease, and stroke. The DASH eating plan may also help with weight loss. WHAT DO I NEED TO KNOW ABOUT THE DASH EATING PLAN? For the DASH eating plan, you will follow these general guidelines:  Choose foods with a percent daily value for sodium of less than 5% (as listed on the food label).  Use salt-free seasonings or herbs instead of table salt or sea salt.  Check with your health care provider or pharmacist before using salt substitutes.  Eat lower-sodium products, often labeled as "lower sodium" or "no salt added."  Eat fresh foods.  Eat more vegetables, fruits, and low-fat dairy products.  Choose whole grains. Look for the word "whole" as the first word in the ingredient list.  Choose fish and skinless chicken or turkey more often than red meat. Limit fish, poultry, and meat to 6 oz (170 g) each day.  Limit sweets, desserts, sugars, and sugary drinks.  Choose heart-healthy fats.  Limit cheese to 1 oz (28 g) per day.  Eat more home-cooked food and less restaurant, buffet, and fast food.  Limit fried foods.  Cook foods using methods other than frying.  Limit canned vegetables. If you do use them, rinse them well to decrease the sodium.  When eating at a restaurant, ask that your food be prepared with less salt, or no salt if possible. WHAT FOODS CAN I EAT? Seek help from a dietitian for individual calorie needs. Grains Whole grain or whole wheat bread. Brown rice. Whole grain or whole wheat pasta. Quinoa, bulgur, and whole grain cereals. Low-sodium cereals. Corn or whole wheat flour tortillas. Whole grain cornbread. Whole grain crackers. Low-sodium crackers. Vegetables Fresh or frozen vegetables (raw, steamed, roasted, or grilled). Low-sodium or reduced-sodium tomato and vegetable juices. Low-sodium or reduced-sodium tomato sauce and paste. Low-sodium  or reduced-sodium canned vegetables.  Fruits All fresh, canned (in natural juice), or frozen fruits. Meat and Other Protein Products Ground beef (85% or leaner), grass-fed beef, or beef trimmed of fat. Skinless chicken or turkey. Ground chicken or turkey. Pork trimmed of fat. All fish and seafood. Eggs. Dried beans, peas, or lentils. Unsalted nuts and seeds. Unsalted canned beans. Dairy Low-fat dairy products, such as skim or 1% milk, 2% or reduced-fat cheeses, low-fat ricotta or cottage cheese, or plain low-fat yogurt. Low-sodium or reduced-sodium cheeses. Fats and Oils Tub margarines without trans fats. Light or reduced-fat mayonnaise and salad dressings (reduced sodium). Avocado. Safflower, olive, or canola oils. Natural peanut or almond butter. Other Unsalted popcorn and pretzels. The items listed above may not be a complete list of recommended foods or beverages. Contact your dietitian for more options. WHAT FOODS ARE NOT RECOMMENDED? Grains White bread. White pasta. White rice. Refined cornbread. Bagels and croissants. Crackers that contain trans fat. Vegetables Creamed or fried vegetables. Vegetables in a cheese sauce. Regular canned vegetables. Regular canned tomato sauce and paste. Regular tomato and vegetable juices. Fruits Dried fruits. Canned fruit in light or heavy syrup. Fruit juice. Meat and Other Protein Products Fatty cuts of meat. Ribs, chicken wings, bacon, sausage, bologna, salami, chitterlings, fatback, hot   dogs, bratwurst, and packaged luncheon meats. Salted nuts and seeds. Canned beans with salt. Dairy Whole or 2% milk, cream, half-and-half, and cream cheese. Whole-fat or sweetened yogurt. Full-fat cheeses or blue cheese. Nondairy creamers and whipped toppings. Processed cheese, cheese spreads, or cheese curds. Condiments Onion and garlic salt, seasoned salt, table salt, and sea salt. Canned and packaged gravies. Worcestershire sauce. Tartar sauce. Barbecue sauce.  Teriyaki sauce. Soy sauce, including reduced sodium. Steak sauce. Fish sauce. Oyster sauce. Cocktail sauce. Horseradish. Ketchup and mustard. Meat flavorings and tenderizers. Bouillon cubes. Hot sauce. Tabasco sauce. Marinades. Taco seasonings. Relishes. Fats and Oils Butter, stick margarine, lard, shortening, ghee, and bacon fat. Coconut, palm kernel, or palm oils. Regular salad dressings. Other Pickles and olives. Salted popcorn and pretzels. The items listed above may not be a complete list of foods and beverages to avoid. Contact your dietitian for more information. WHERE CAN I FIND MORE INFORMATION? National Heart, Lung, and Blood Institute: www.nhlbi.nih.gov/health/health-topics/topics/dash/ Document Released: 11/11/2011 Document Revised: 04/08/2014 Document Reviewed: 09/26/2013 ExitCare Patient Information 2015 [employer]. This information is not intended to replace advice given to you by your health care provider. Make sure you discuss any questions you have with your health care provider. Smoking Cessation Quitting smoking is important to your health and has many advantages. However, it is not always easy to quit since nicotine is a very addictive drug. Oftentimes, people try 3 times or more before being able to quit. This document explains the best ways for you to prepare to quit smoking. Quitting takes hard work and a lot of effort, but you can do it. ADVANTAGES OF QUITTING SMOKING  You will live longer, feel better, and live better.  Your body will feel the impact of quitting smoking almost immediately.  Within 20 minutes, blood pressure decreases. Your pulse returns to its normal level.  After 8 hours, carbon monoxide levels in the blood return to normal. Your oxygen level increases.  After 24 hours, the chance of having a heart attack starts to decrease. Your breath, hair, and body stop smelling like smoke.  After 48 hours, damaged nerve endings begin to recover. Your sense  of taste and smell improve.  After 72 hours, the body is virtually free of nicotine. Your bronchial tubes relax and breathing becomes easier.  After 2 to 12 weeks, lungs can hold more air. Exercise becomes easier and circulation improves.  The risk of having a heart attack, stroke, cancer, or lung disease is greatly reduced.  After 1 year, the risk of coronary heart disease is cut in half.  After 5 years, the risk of stroke falls to the same as a nonsmoker.  After 10 years, the risk of lung cancer is cut in half and the risk of other cancers decreases significantly.  After 15 years, the risk of coronary heart disease drops, usually to the level of a nonsmoker.  If you are pregnant, quitting smoking will improve your chances of having a healthy baby.  The people you live with, especially any children, will be healthier.  You will have extra money to spend on things other than cigarettes. QUESTIONS TO THINK ABOUT BEFORE ATTEMPTING TO QUIT You may want to talk about your answers with your health care provider.  Why do you want to quit?  If you tried to quit in the past, what helped and what did not?  What will be the most difficult situations for you after you quit? How will you plan to handle them?  Who   can help you through the tough times? Your family? Friends? A health care provider?  What pleasures do you get from smoking? What ways can you still get pleasure if you quit? Here are some questions to ask your health care provider:  How can you help me to be successful at quitting?  What medicine do you think would be best for me and how should I take it?  What should I do if I need more help?  What is smoking withdrawal like? How can I get information on withdrawal? GET READY  Set a quit date.  Change your environment by getting rid of all cigarettes, ashtrays, matches, and lighters in your home, car, or work. Do not let people smoke in your home.  Review your past  attempts to quit. Think about what worked and what did not. GET SUPPORT AND ENCOURAGEMENT You have a better chance of being successful if you have help. You can get support in many ways.  Tell your family, friends, and coworkers that you are going to quit and need their support. Ask them not to smoke around you.  Get individual, group, or telephone counseling and support. Programs are available at local hospitals and health centers. Call your local health department for information about programs in your area.  Spiritual beliefs and practices may help some smokers quit.  Download a "quit meter" on your computer to keep track of quit statistics, such as how long you have gone without smoking, cigarettes not smoked, and money saved.  Get a self-help book about quitting smoking and staying off tobacco. LEARN NEW SKILLS AND BEHAVIORS  Distract yourself from urges to smoke. Talk to someone, go for a walk, or occupy your time with a task.  Change your normal routine. Take a different route to work. Drink tea instead of coffee. Eat breakfast in a different place.  Reduce your stress. Take a hot bath, exercise, or read a book.  Plan something enjoyable to do every day. Reward yourself for not smoking.  Explore interactive web-based programs that specialize in helping you quit. GET MEDICINE AND USE IT CORRECTLY Medicines can help you stop smoking and decrease the urge to smoke. Combining medicine with the above behavioral methods and support can greatly increase your chances of successfully quitting smoking.  Nicotine replacement therapy helps deliver nicotine to your body without the negative effects and risks of smoking. Nicotine replacement therapy includes nicotine gum, lozenges, inhalers, nasal sprays, and skin patches. Some may be available over-the-counter and others require a prescription.  Antidepressant medicine helps people abstain from smoking, but how this works is unknown. This  medicine is available by prescription.  Nicotinic receptor partial agonist medicine simulates the effect of nicotine in your brain. This medicine is available by prescription. Ask your health care provider for advice about which medicines to use and how to use them based on your health history. Your health care provider will tell you what side effects to look out for if you choose to be on a medicine or therapy. Carefully read the information on the package. Do not use any other product containing nicotine while using a nicotine replacement product.  RELAPSE OR DIFFICULT SITUATIONS Most relapses occur within the first 3 months after quitting. Do not be discouraged if you start smoking again. Remember, most people try several times before finally quitting. You may have symptoms of withdrawal because your body is used to nicotine. You may crave cigarettes, be irritable, feel very hungry, cough often, get   headaches, or have difficulty concentrating. The withdrawal symptoms are only temporary. They are strongest when you first quit, but they will go away within 10-14 days. To reduce the chances of relapse, try to:  Avoid drinking alcohol. Drinking lowers your chances of successfully quitting.  Reduce the amount of caffeine you consume. Once you quit smoking, the amount of caffeine in your body increases and can give you symptoms, such as a rapid heartbeat, sweating, and anxiety.  Avoid smokers because they can make you want to smoke.  Do not let weight gain distract you. Many smokers will gain weight when they quit, usually less than 10 pounds. Eat a healthy diet and stay active. You can always lose the weight gained after you quit.  Find ways to improve your mood other than smoking. FOR MORE INFORMATION  www.smokefree.gov  Document Released: 11/16/2001 Document Revised: 04/08/2014 Document Reviewed: 03/02/2012 ExitCare Patient Information 2015 [employer]. This information is not intended to  replace advice given to you by your health care provider. Make sure you discuss any questions you have with your health care provider.  

## 2015-01-31 LAB — URINALYSIS
Bilirubin Urine: NEGATIVE
Glucose, UA: >1000 mg/dL — AB
Hgb urine dipstick: NEGATIVE
Ketones, ur: NEGATIVE mg/dL
Leukocytes, UA: NEGATIVE
NITRITE: NEGATIVE
PROTEIN: 30 mg/dL — AB
Specific Gravity, Urine: 1.027 (ref 1.005–1.030)
Urobilinogen, UA: 0.2 mg/dL (ref 0.0–1.0)
pH: 5 (ref 5.0–8.0)

## 2015-01-31 LAB — COMPLETE METABOLIC PANEL WITH GFR
ALK PHOS: 68 U/L (ref 39–117)
ALT: 39 U/L — ABNORMAL HIGH (ref 0–35)
AST: 42 U/L — ABNORMAL HIGH (ref 0–37)
Albumin: 4.1 g/dL (ref 3.5–5.2)
BILIRUBIN TOTAL: 0.3 mg/dL (ref 0.2–1.2)
BUN: 9 mg/dL (ref 6–23)
CO2: 21 meq/L (ref 19–32)
CREATININE: 0.63 mg/dL (ref 0.50–1.10)
Calcium: 9.3 mg/dL (ref 8.4–10.5)
Chloride: 99 mEq/L (ref 96–112)
GFR, Est African American: >89 mL/min
GFR, Est Non African American: >89 mL/min
GLUCOSE: 246 mg/dL — AB (ref 70–99)
Potassium: 4.9 mEq/L (ref 3.5–5.3)
SODIUM: 133 meq/L — AB (ref 135–145)
TOTAL PROTEIN: 7.2 g/dL (ref 6.0–8.3)

## 2015-02-04 ENCOUNTER — Telehealth: Payer: Self-pay

## 2015-02-04 NOTE — Telephone Encounter (Signed)
Patient is aware of her urine results 

## 2015-02-04 NOTE — Telephone Encounter (Signed)
-----   Message from Lorayne Marek, MD sent at 01/31/2015  9:17 AM EST ----- Call and let the patient know that her urine is negative for infection but patient has uncontrolled diabetes which can cause urinary frequency, advise patient with compliance with diabetes medications.

## 2015-02-07 ENCOUNTER — Other Ambulatory Visit: Payer: Self-pay | Admitting: Internal Medicine

## 2015-02-10 ENCOUNTER — Other Ambulatory Visit: Payer: Self-pay | Admitting: Internal Medicine

## 2015-03-17 ENCOUNTER — Other Ambulatory Visit: Payer: Self-pay | Admitting: Internal Medicine

## 2015-04-24 ENCOUNTER — Other Ambulatory Visit: Payer: Self-pay | Admitting: Internal Medicine

## 2015-05-15 ENCOUNTER — Encounter (HOSPITAL_COMMUNITY): Payer: Self-pay | Admitting: Obstetrics and Gynecology

## 2015-07-14 ENCOUNTER — Emergency Department (INDEPENDENT_AMBULATORY_CARE_PROVIDER_SITE_OTHER)
Admission: EM | Admit: 2015-07-14 | Discharge: 2015-07-14 | Disposition: A | Discharge status: Home / Self Care | Payer: Medicare Other | Source: Home / Self Care | Attending: Family Medicine | Admitting: Family Medicine

## 2015-07-14 ENCOUNTER — Encounter (HOSPITAL_COMMUNITY): Payer: Self-pay | Admitting: Emergency Medicine

## 2015-07-14 DIAGNOSIS — B373 Candidiasis of vulva and vagina: Secondary | ICD-10-CM | POA: Diagnosis not present

## 2015-07-14 DIAGNOSIS — B3731 Acute candidiasis of vulva and vagina: Secondary | ICD-10-CM

## 2015-07-14 MED ORDER — TERCONAZOLE 80 MG VA SUPP: 80.0000 mg | Freq: Every day | VAGINAL | Status: DC

## 2015-07-14 MED ORDER — FLUCONAZOLE 150 MG PO TABS: 150.0000 mg | ORAL_TABLET | Freq: Once | ORAL | Status: DC

## 2015-07-14 NOTE — ED Provider Notes (Signed)
CSN: [id number]     Arrival date & time 07/14/15  1335 History   First MD Initiated Contact with Patient 07/14/15 1457     Chief Complaint  Patient presents with  . Vaginal Discharge   (Consider location/radiation/quality/duration/timing/severity/associated sxs/prior Treatment) Patient is a 44 y.o. Long presenting with vaginal discharge. The history is provided by the patient.  Vaginal Discharge Quality:  Thick and white Severity:  Mild Onset quality:  Gradual Duration:  4 days Progression:  Unchanged Chronicity:  Recurrent Context: spontaneously   Context comment:  Poorly controlled diabetes, common recurrent problem. Relieved by:  None tried Worsened by:  Nothing tried   Past Medical History  Diagnosis Date  . Fibroid   . Preterm labor   . Herpes   . Bipolar 1 disorder   . Hypertension     on aldomet  . Diabetes mellitus     nph 20U qam and qpm, regular with meals  . Diabetes in pregnancy   . Headache(784.0)   . Depression     recently added wellbutrin-has not taken yet for bipolar  . H/O varicella   . Abnormal Pap smear 1998  . Trichomonas   . Yeast infection   . Bacterial infection   . Galactorrhea of right breast 2008  . Oligomenorrhea 07/2007  . Candida vaginitis 07/2007  . H/O amenorrhea 07/2007  . Hx: UTI (urinary tract infection) 2009  . Pelvic pain in Long   . HSV-2 infection 01/03/2009  . H/O dysmenorrhea 2010  . Increased BMI 2010  . Heavy vaginal bleeding due to contraceptive injection use 10/12/2011    Depo Provera  . H/O menorrhagia 10/14/11  . Obesity 10/14/11  . H/O dizziness 10/14/11  . Abnormal vaginal bleeding 12/19/2011   Past Surgical History  Procedure Laterality Date  . Cesarean section  1991   Family History  Problem Relation Age of Onset  . Hypertension Mother   . Diabetes Mother   . Heart disease Mother   . Hypertension Father   . Diabetes Father   . Heart disease Father   . Other Neg Hx   . Stroke Maternal Grandfather     History  Substance Use Topics  . Smoking status: Current Every Day Smoker -- 0.50 packs/day for 20 years    Types: Cigarettes  . Smokeless tobacco: Never Used  . Alcohol Use: No   OB History    Gravida Para Term Preterm AB TAB SAB Ectopic Multiple Living   _0 0 0 0 4     Review of Systems  Constitutional: Negative.   Gastrointestinal: Negative.   Genitourinary: Positive for vaginal discharge. Negative for vaginal bleeding.    Allergies  Sulfa antibiotics and Strawberry  Home Medications   Prior to Admission medications   Medication Sig Start Date End Date Taking? Authorizing Provider  ciprofloxacin (CIPRO) 500 MG tablet Take 1 tablet (500 mg total) by mouth 2 (two) times daily. Patient not taking: Reported on 07/14/2015 09/30/14   Delos Haring, PA-C  fluconazole (DIFLUCAN) 150 MG tablet Take 1 tablet (150 mg total) by mouth once. Repeat in 1 week 07/14/15   Billy Fischer, MD  glucose monitoring kit (FREESTYLE) monitoring kit 1 each by Does not apply route 4 (four) times daily - after meals and at bedtime. 1 month Diabetic Testing Supplies for QAC-QHS accuchecks. 01/30/15   Lorayne Marek, MD  Insulin Detemir (LEVEMIR [place]) Inject into the skin.    Historical Provider, MD  insulin detemir (  LEVEMIR) 100 UNIT/ML injection INJECT 10 UNITS EVERY MORNING AND Sarah UNITS AT NIGHT 01/30/15   Lorayne Marek, MD  lisinopril-hydrochlorothiazide (PRINZIDE,ZESTORETIC) 10-12.5 MG per tablet Take 1 tablet by mouth daily. 01/30/15   Lorayne Marek, MD  metFORMIN (GLUCOPHAGE) 1000 MG tablet Take 1 tablet (1,000 mg total) by mouth 2 (two) times daily with a meal. 01/30/15   Lorayne Marek, MD  METFORMIN HCL PO Take by mouth.    Historical Provider, MD  naproxen sodium (ALEVE) 220 MG tablet Take 440 mg by mouth 2 (two) times daily as needed (pain).    Historical Provider, MD  ondansetron (ZOFRAN) 4 MG tablet Take 1 tablet (4 mg total) by mouth every 6 (six) hours. 09/30/14   Tiffany Carlota Raspberry, PA-C   terconazole (TERAZOL 3) 80 MG vaginal suppository Place 1 suppository (80 mg total) vaginally at bedtime. 07/14/15   Billy Fischer, MD   BP 191/108 mmHg  Pulse 106  Temp(Src) 98.5 F (36.9 C) (Oral)  Resp 16  SpO2 95%  LMP 06/19/2015 Physical Exam  Constitutional: She is oriented to person, place, and time. She appears well-developed and well-nourished.  Abdominal: Soft. Bowel sounds are normal. There is no tenderness.  Neurological: She is alert and oriented to person, place, and time.  Skin: Skin is warm and dry. No rash noted.  Nursing note and vitals reviewed.   ED Course  Procedures (including critical care time) Labs Review Labs Reviewed - No data to display  Imaging Review No results found.   MDM   1. Vaginitis due to Candida albicans       Billy Fischer, MD 07/14/15 [phone]

## 2015-07-14 NOTE — Discharge Instructions (Signed)
Use medicine as prescribed and go to DTC as advised.

## 2015-07-14 NOTE — ED Notes (Addendum)
Vaginal discharge for 4 days.  Patient thinks this is yeast or BV.  Patient is not taking medicines. Denies being out of medicine, just not taking as instructed

## 2015-07-16 ENCOUNTER — Encounter (HOSPITAL_COMMUNITY): Payer: Self-pay

## 2015-07-16 ENCOUNTER — Inpatient Hospital Stay (HOSPITAL_COMMUNITY)
Admission: AD | Admit: 2015-07-16 | Discharge: 2015-07-16 | Disposition: A | Discharge status: Home / Self Care | Payer: Medicare Other | Source: Ambulatory Visit | Attending: Obstetrics & Gynecology | Admitting: Obstetrics & Gynecology

## 2015-07-16 DIAGNOSIS — B3731 Acute candidiasis of vulva and vagina: Secondary | ICD-10-CM

## 2015-07-16 DIAGNOSIS — A6000 Herpesviral infection of urogenital system, unspecified: Secondary | ICD-10-CM | POA: Insufficient documentation

## 2015-07-16 DIAGNOSIS — Z886 Allergy status to analgesic agent status: Secondary | ICD-10-CM | POA: Insufficient documentation

## 2015-07-16 DIAGNOSIS — L293 Anogenital pruritus, unspecified: Secondary | ICD-10-CM | POA: Diagnosis present

## 2015-07-16 DIAGNOSIS — I10 Essential (primary) hypertension: Secondary | ICD-10-CM | POA: Diagnosis not present

## 2015-07-16 DIAGNOSIS — B373 Candidiasis of vulva and vagina: Secondary | ICD-10-CM | POA: Diagnosis not present

## 2015-07-16 DIAGNOSIS — F17210 Nicotine dependence, cigarettes, uncomplicated: Secondary | ICD-10-CM | POA: Insufficient documentation

## 2015-07-16 DIAGNOSIS — E119 Type 2 diabetes mellitus without complications: Secondary | ICD-10-CM | POA: Insufficient documentation

## 2015-07-16 DIAGNOSIS — F329 Major depressive disorder, single episode, unspecified: Secondary | ICD-10-CM | POA: Insufficient documentation

## 2015-07-16 DIAGNOSIS — B009 Herpesviral infection, unspecified: Secondary | ICD-10-CM

## 2015-07-16 LAB — URINALYSIS, ROUTINE W REFLEX MICROSCOPIC
BILIRUBIN URINE: NEGATIVE
KETONES UR: 15 mg/dL — AB
Leukocytes, UA: NEGATIVE
Nitrite: NEGATIVE
Protein, ur: 30 mg/dL — AB
Specific Gravity, Urine: 1.025 (ref 1.005–1.030)
Urobilinogen, UA: 0.2 mg/dL (ref 0.0–1.0)
pH: 5.5 (ref 5.0–8.0)

## 2015-07-16 LAB — URINE MICROSCOPIC-ADD ON

## 2015-07-16 LAB — WET PREP, GENITAL
Clue Cells Wet Prep HPF POC: NONE SEEN
Trich, Wet Prep: NONE SEEN
Yeast Wet Prep HPF POC: NONE SEEN

## 2015-07-16 LAB — POCT PREGNANCY, URINE: PREG TEST UR: NEGATIVE

## 2015-07-16 MED ORDER — TERCONAZOLE 0.4 % VA CREA: 1.0000 | TOPICAL_CREAM | Freq: Every day | VAGINAL | Status: DC

## 2015-07-16 MED ORDER — VALACYCLOVIR HCL 500 MG PO TABS: 500.0000 mg | ORAL_TABLET | Freq: Two times a day (BID) | ORAL | Status: DC

## 2015-07-16 NOTE — Discharge Instructions (Signed)
Herpes Simplex Herpes simplex is generally classified as Type 1 or Type 2. Type 1 is generally the type that is responsible for cold sores. Type 2 is generally associated with sexually transmitted diseases. We now know that most of the thoughts on these viruses are inaccurate. We find that HSV1 is also present genitally and HSV2 can be present orally, but this will vary in different locations of the world. Herpes simplex is usually detected by doing a culture. Blood tests are also available for this virus; however, the accuracy is often not as good.  PREPARATION FOR TEST No preparation or fasting is necessary. NORMAL FINDINGS  No virus present  No HSV antigens or antibodies present Ranges for normal findings may vary among different laboratories and hospitals. You should always check with your doctor after having lab work or other tests done to discuss the meaning of your test results and whether your values are considered within normal limits. MEANING OF TEST  Your caregiver will go over the test results with you and discuss the importance and meaning of your results, as well as treatment options and the need for additional tests if necessary. OBTAINING THE TEST RESULTS  It is your responsibility to obtain your test results. Ask the lab or department performing the test when and how you will get your results. Document Released: 12/25/2004 Document Revised: 02/14/2012 Document Reviewed: 11/02/2008 [facility] Patient Information 2015 [place], [place]. This information is not intended to replace advice given to you by your health care provider. Make sure you discuss any questions you have with your health care provider.

## 2015-07-16 NOTE — MAU Note (Addendum)
Went to Urgent care on [address]. Took Diflucan on Monday then Terazol on Monday and is still having vaginal burning with odor.  Abd cramps on low left side x 2 days.  Raw vaginal area but no vag bleeding, using a peri bottle to wash because it hurts so much.   Has Diabetes, was on Metformin but stopped taking 4 months ago because didn't know how much she should be on whether once a day or twice a day. Hasn't followed up because her doctor moved away.

## 2015-07-16 NOTE — MAU Provider Note (Signed)
History     CSN: [id number]  Arrival date and time: 07/16/15 2004   First Provider Initiated Contact with Patient 07/16/15 2116      Chief Complaint  Patient presents with  . Vaginal Itching  . Abdominal Cramping   HPI Comments: Sarah Long is a 44 y.o. [id number] who presents today with vulvar irritation. She states that it began about two weeks ago. She was seen at [facility] and was given diflucan and terazol. She states that she has been using them for since 07/14/15 and states that she has not had any relief. She states that she has had HSV. She states that she has not had an outbreak, and was diagnosed with a blood test at one point. She states that she has one terazol suppository left and a refill for diflucan.   Vaginal Itching The patient's primary symptoms include genital itching. This is a new problem. The current episode started in the past 7 days. The problem occurs constantly. The problem has been unchanged. The pain is mild. The problem affects the left side. She is not pregnant. Associated symptoms include abdominal pain and dysuria ("buring when I pee". ). Pertinent negatives include no constipation, diarrhea, fever, frequency, nausea, urgency or vomiting. The vaginal discharge was thick and white. Vaginal bleeding amount: LMP: 06/29/15. Nothing aggravates the symptoms. She has tried antifungals for the symptoms. The treatment provided no relief. She is sexually active. It is unknown whether or not her partner has an STD. She uses nothing for contraception. Her menstrual history has been regular.   Past Medical History  Diagnosis Date  . Fibroid   . Preterm labor   . Herpes   . Bipolar 1 disorder   . Hypertension     on aldomet  . Diabetes mellitus     nph 20U qam and qpm, regular with meals  . Diabetes in pregnancy   . Headache(784.0)   . Depression     recently added wellbutrin-has not taken yet for bipolar  . H/O varicella   . Abnormal Pap smear 1998  . Trichomonas   .  Yeast infection   . Bacterial infection   . Galactorrhea of right breast 2008  . Oligomenorrhea 07/2007  . Candida vaginitis 07/2007  . H/O amenorrhea 07/2007  . Hx: UTI (urinary tract infection) 2009  . Pelvic pain in female   . HSV-2 infection 01/03/2009  . H/O dysmenorrhea 2010  . Increased BMI 2010  . Heavy vaginal bleeding due to contraceptive injection use 10/12/2011    Depo Provera  . H/O menorrhagia 10/14/11  . Obesity 10/14/11  . H/O dizziness 10/14/11  . Abnormal vaginal bleeding 12/19/2011    Past Surgical History  Procedure Laterality Date  . Cesarean section  1991    Family History  Problem Relation Age of Onset  . Hypertension Mother   . Diabetes Mother   . Heart disease Mother   . Hypertension Father   . Diabetes Father   . Heart disease Father   . Other Neg Hx   . Stroke Maternal Grandfather     Social History  Substance Use Topics  . Smoking status: Current Every Day Smoker -- 0.50 packs/day for 20 years    Types: Cigarettes  . Smokeless tobacco: Never Used  . Alcohol Use: No    Allergies:  Allergies  Allergen Reactions  . Sulfa Antibiotics Hives and Itching  . Strawberry Hives    Prescriptions prior to admission  Medication Sig Dispense Refill Last  Dose  . Dextromethorphan-Guaifenesin (ROBITUSSIN DM PO) Take 5 mLs by mouth 4 (four) times daily as needed (cold).   07/16/2015 at 1200  . diphenhydramine-acetaminophen (TYLENOL PM) 25-500 MG TABS Take 2 tablets by mouth at bedtime as needed (sleep).   07/15/2015 at Unknown time  . glucose monitoring kit (FREESTYLE) monitoring kit 1 each by Does not apply route 4 (four) times daily - after meals and at bedtime. 1 month Diabetic Testing Supplies for QAC-QHS accuchecks. 1 each 1   . Lactobacillus (PROBIOTIC ACIDOPHILUS PO) Take 1 capsule by mouth daily.   07/16/2015 at Unknown time  . lisinopril-hydrochlorothiazide (PRINZIDE,ZESTORETIC) 10-12.5 MG per tablet Take 1 tablet by mouth daily. 90 tablet 3 Past Week at  Unknown time  . Miscellaneous Vaginal Products ([place]) Place 1 applicator vaginally daily as needed (yeast problems).   07/15/2015 at Unknown time  . naproxen sodium (ALEVE) 220 MG tablet Take 440 mg by mouth 2 (two) times daily as needed (pain).   Past Week at Unknown time  . Pseudoephedrine-APAP-DM (DAYQUIL PO) Take 2 tablets by mouth 4 (four) times daily as needed (cough).   07/15/2015 at Unknown time  . terconazole (TERAZOL 3) 80 MG vaginal suppository Place 1 suppository (80 mg total) vaginally at bedtime. 3 suppository 0 07/15/2015 at Unknown time  . ciprofloxacin (CIPRO) 500 MG tablet Take 1 tablet (500 mg total) by mouth 2 (two) times daily. (Patient not taking: Reported on 07/14/2015) 28 tablet 0 Completed Course at Unknown time  . fluconazole (DIFLUCAN) 150 MG tablet Take 1 tablet (150 mg total) by mouth once. Repeat in 1 week (Patient not taking: Reported on 07/16/2015) 1 tablet 1 Completed Course at Unknown time  . insulin detemir (LEVEMIR) 100 UNIT/ML injection INJECT 10 UNITS EVERY MORNING AND 70 UNITS AT NIGHT (Patient not taking: Reported on 07/16/2015) 10 mL 11 Not Taking at Unknown time  . metFORMIN (GLUCOPHAGE) 1000 MG tablet Take 1 tablet (1,000 mg total) by mouth 2 (two) times daily with a meal. (Patient not taking: Reported on 07/16/2015) 180 tablet 3 Not Taking at Unknown time  . ondansetron (ZOFRAN) 4 MG tablet Take 1 tablet (4 mg total) by mouth every 6 (six) hours. (Patient not taking: Reported on 07/16/2015) 12 tablet 0 Completed Course at Unknown time    Review of Systems  Constitutional: Negative for fever.  Gastrointestinal: Positive for abdominal pain. Negative for nausea, vomiting, diarrhea and constipation.  Genitourinary: Positive for dysuria ("buring when I pee". ). Negative for urgency and frequency.   Physical Exam   Blood pressure 171/92, pulse 105, temperature 98.4 F (36.9 C), temperature source Oral, resp. rate 16, height $RemoveBe'5\' 4"'SbVFVkbvy$  (1.626 m), weight  89.415 kg (197 lb 2 oz), last menstrual period 06/29/2015, SpO2 99 %.  Physical Exam  Nursing note and vitals reviewed. Constitutional: She is oriented to person, place, and time. She appears well-developed and well-nourished. No distress.  HENT:  Head: Normocephalic.  Cardiovascular: Normal rate.   Respiratory: Effort normal.  GI: Soft. There is no tenderness. There is no rebound.  Neurological: She is alert and oriented to person, place, and time.  Skin: Skin is warm and dry.  Psychiatric: She has a normal mood and affect.     Results for orders placed or performed during the hospital encounter of 07/16/15 (from the past 24 hour(s))  Urinalysis, Routine w reflex microscopic (not at [facility])     Status: Abnormal   Collection Time: 07/16/15  8:24 PM  Result Value Ref  Range   Color, Urine YELLOW YELLOW   APPearance CLEAR CLEAR   Specific Gravity, Urine 1.025 1.005 - 1.030   pH 5.5 5.0 - 8.0   Glucose, UA >1000 (A) NEGATIVE mg/dL   Hgb urine dipstick TRACE (A) NEGATIVE   Bilirubin Urine NEGATIVE NEGATIVE   Ketones, ur 15 (A) NEGATIVE mg/dL   Protein, ur 30 (A) NEGATIVE mg/dL   Urobilinogen, UA 0.2 0.0 - 1.0 mg/dL   Nitrite NEGATIVE NEGATIVE   Leukocytes, UA NEGATIVE NEGATIVE  Urine microscopic-add on     Status: None   Collection Time: 07/16/15  8:24 PM  Result Value Ref Range   Squamous Epithelial / LPF RARE RARE   Bacteria, UA RARE RARE  Pregnancy, urine POC     Status: None   Collection Time: 07/16/15  8:35 PM  Result Value Ref Range   Preg Test, Ur NEGATIVE NEGATIVE  Wet prep, genital     Status: Abnormal   Collection Time: 07/16/15  8:56 PM  Result Value Ref Range   Yeast Wet Prep HPF POC NONE SEEN NONE SEEN   Trich, Wet Prep NONE SEEN NONE SEEN   Clue Cells Wet Prep HPF POC NONE SEEN NONE SEEN   WBC, Wet Prep HPF POC RARE (A) NONE SEEN    MAU Course  Procedures  MDM   Assessment and Plan   1. Yeast infection involving the vagina and surrounding area   2.  Herpes infection    DC home Comfort measures reviewed  RX: terazol 7, and valtrex $RemoveBef'500mg'[id number]$  #30  Return to MAU as needed   Follow-up Information    Schedule an appointment as soon as possible for a visit with [facility].   Specialty:  Obstetrics and Gynecology   Contact information:   [address] [place] [id number] [phone]        Mathis Bud 07/16/2015, 9:18 PM

## 2015-07-17 LAB — GC/CHLAMYDIA PROBE AMP (~~LOC~~) NOT AT ARMC
CHLAMYDIA, DNA PROBE: NEGATIVE
Neisseria Gonorrhea: NEGATIVE

## 2015-07-18 LAB — URINE CULTURE: Culture: >=100000

## 2015-09-13 ENCOUNTER — Emergency Department (HOSPITAL_COMMUNITY)
Admission: EM | Admit: 2015-09-13 | Discharge: 2015-09-13 | Disposition: A | Discharge status: Home / Self Care | Payer: Medicare Other | Attending: Emergency Medicine | Admitting: Emergency Medicine

## 2015-09-13 ENCOUNTER — Encounter (HOSPITAL_COMMUNITY): Payer: Self-pay | Admitting: *Deleted

## 2015-09-13 DIAGNOSIS — Y998 Other external cause status: Secondary | ICD-10-CM | POA: Diagnosis not present

## 2015-09-13 DIAGNOSIS — E669 Obesity, unspecified: Secondary | ICD-10-CM | POA: Diagnosis not present

## 2015-09-13 DIAGNOSIS — Z86018 Personal history of other benign neoplasm: Secondary | ICD-10-CM | POA: Diagnosis not present

## 2015-09-13 DIAGNOSIS — S8991XA Unspecified injury of right lower leg, initial encounter: Secondary | ICD-10-CM | POA: Diagnosis present

## 2015-09-13 DIAGNOSIS — Y92000 Kitchen of unspecified non-institutional (private) residence as  the place of occurrence of the external cause: Secondary | ICD-10-CM | POA: Insufficient documentation

## 2015-09-13 DIAGNOSIS — Y9341 Activity, dancing: Secondary | ICD-10-CM | POA: Insufficient documentation

## 2015-09-13 DIAGNOSIS — Z79899 Other long term (current) drug therapy: Secondary | ICD-10-CM | POA: Diagnosis not present

## 2015-09-13 DIAGNOSIS — E119 Type 2 diabetes mellitus without complications: Secondary | ICD-10-CM | POA: Diagnosis not present

## 2015-09-13 DIAGNOSIS — S76301A Unspecified injury of muscle, fascia and tendon of the posterior muscle group at thigh level, right thigh, initial encounter: Secondary | ICD-10-CM

## 2015-09-13 DIAGNOSIS — Z8619 Personal history of other infectious and parasitic diseases: Secondary | ICD-10-CM | POA: Insufficient documentation

## 2015-09-13 DIAGNOSIS — X58XXXA Exposure to other specified factors, initial encounter: Secondary | ICD-10-CM | POA: Diagnosis not present

## 2015-09-13 DIAGNOSIS — Z87440 Personal history of urinary (tract) infections: Secondary | ICD-10-CM | POA: Diagnosis not present

## 2015-09-13 DIAGNOSIS — I10 Essential (primary) hypertension: Secondary | ICD-10-CM | POA: Insufficient documentation

## 2015-09-13 DIAGNOSIS — S76311A Strain of muscle, fascia and tendon of the posterior muscle group at thigh level, right thigh, initial encounter: Secondary | ICD-10-CM | POA: Insufficient documentation

## 2015-09-13 DIAGNOSIS — Z8751 Personal history of pre-term labor: Secondary | ICD-10-CM | POA: Diagnosis not present

## 2015-09-13 DIAGNOSIS — Z720 Tobacco use: Secondary | ICD-10-CM | POA: Insufficient documentation

## 2015-09-13 DIAGNOSIS — F319 Bipolar disorder, unspecified: Secondary | ICD-10-CM | POA: Insufficient documentation

## 2015-09-13 MED ORDER — IBUPROFEN 800 MG PO TABS
800.0000 mg | ORAL_TABLET | Freq: Three times a day (TID) | ORAL | Status: DC
Start: 2015-09-13 — End: 2015-12-23

## 2015-09-13 NOTE — ED Notes (Signed)
Declined W/C at D/C and was escorted to lobby by RN. 

## 2015-09-13 NOTE — ED Notes (Signed)
Pt reports she fell in her kitchen last night. Pt reports she now has pain in RT leg.

## 2015-09-13 NOTE — Discharge Instructions (Signed)
Hamstring Strain With Rehab The hamstring muscle and tendons are vulnerable to muscle or tendon tear (strain). Hamstring tears cause pain and inflammation in the backside of the thigh, where the hamstring muscles are located. The hamstring is comprised of three muscles that are responsible for straightening the hip, bending the knee, and stabilizing the knee. These muscles are important for walking, running, and jumping. Hamstring strain is the most common injury of the thigh. Hamstring strains are classified as grade 1 or 2 strains. Grade 1 strains cause pain, but the tendon is not lengthened. Grade 2 strains include a lengthened ligament due to the ligament being stretched or partially ruptured. With grade 2 strains there is still function, although the function may be decreased.  SYMPTOMS   Pain, tenderness, swelling, warmth, or redness over the hamstring muscles, at the back of the thigh.  Pain that gets worse during and after intense activity.  A "pop" heard in the area, at the time of injury.  Muscle spasm in the hamstring muscles.  Pain or weakness with running, jumping, or bending the knee against resistance.  Crackling sound (crepitation) when the tendon is moved or touched.  Bruising (contusion) in the thigh within 48 hours of injury.  Loss of fullness of the muscle, or area of muscle bulging in the case of a complete rupture. CAUSES  A muscle strain occurs when a force is placed on the muscle or tendon that is greater than it can withstand. Common causes of injury include:  Strain from overuse or sudden increase in the frequency, duration, or intensity of activity.  Single violent blow or force to the back of the knee or the hamstring area of the thigh. RISK INCREASES WITH:  Sports that require quick starts (sprinting, racquetball, tennis).  Sports that require jumping (basketball, volleyball).  Kicking sports and water skiing.  Contact sports (soccer, football).  Poor  strength and flexibility.  Failure to warm up properly before activity.  Previous thigh, knee, or pelvis injury.  Poor exercise technique.  Poor posture. PREVENTION  Maintain physical fitness:  Strength, flexibility, and endurance.  Cardiovascular fitness.  Learn and use proper exercise technique and posture.  Wear proper fitted and padded protective equipment. PROGNOSIS  If treated properly, hamstring strains are usually curable in 2 to 6 weeks. RELATED COMPLICATIONS   Longer healing time, if not properly treated or if not given adequate time to heal.  Chronically inflamed tendon, causing persistent pain with activity that may progress to constant pain.  Recurring symptoms, if activity is resumed too soon.  Vulnerable to repeated injury (in up to 25% of cases). TREATMENT  Treatment first involves the use of ice and medication to help reduce pain and inflammation. It is also important to complete strengthening and stretching exercises, as well as modifying any activities that aggravate the symptoms. These exercises may be completed at home or with a therapist. Your caregiver may recommend the use of crutches to help reduce pain and discomfort, especially is the strain is severe enough to cause limping. If the tendon has pulled away from the bone, then surgery is necessary to reattach it. MEDICATION   If pain medicine is needed, nonsteroidal anti-inflammatory medicines (aspirin and ibuprofen), or other minor pain relievers (acetaminophen), are often advised.  Do not take pain medicine for 7 days before surgery.  Prescription pain relievers may be given if your caregiver thinks they are needed. Use only as directed and only as much as you need.  Corticosteroid injections may be   recommended. However, these injections should only be used for serious cases, as they can only be given a certain number of times.  Ointments applied to the skin may be beneficial. HEAT AND  COLD  Cold treatment (icing) relieves pain and reduces inflammation. Cold treatment should be applied for 10 to 15 minutes every 2 to 3 hours, and immediately after activity that aggravates your symptoms. Use ice packs or an ice massage.  Heat treatment may be used before performing the stretching and strengthening activities prescribed by your caregiver, physical therapist, or athletic trainer. Use a heat pack or a warm water soak. SEEK MEDICAL CARE IF:   Symptoms get worse or do not improve in 2 weeks, despite treatment.  New, unexplained symptoms develop. (Drugs used in treatment may produce side effects.) EXERCISES RANGE OF MOTION (ROM) AND STRETCHING EXERCISES - Hamstring Strain These exercises may help you when beginning to rehabilitate your injury. Your symptoms may go away with or without further involvement from your physician, physical therapist or athletic trainer. While completing these exercises, remember:   Restoring tissue flexibility helps normal motion to return to the joints. This allows healthier, less painful movement and activity.  An effective stretch should be held for at least 30 seconds.  A stretch should never be painful. You should only feel a gentle lengthening or release in the stretched tissue. STRETCH - Hamstrings, Standing  Stand or sit, and extend your right / left leg, placing your foot on a chair or foot stool.  Keep a slight arch in your low back and your hips straight forward.  Lead with your chest, and lean forward at the waist until you feel a gentle stretch in the back of your right / left knee or thigh. (When done correctly, this exercise requires leaning only a small distance.)  Hold this position for __________ seconds. Repeat __________ times. Complete this stretch __________ times per day. STRETCH - Hamstrings, Supine   Lie on your back. Loop a belt or towel over the ball of your right / left foot.  Straighten your right / left knee and  slowly pull on the belt to raise your leg. Do not allow the right / left knee to bend. Keep your opposite leg flat on the floor.  Raise the leg until you feel a gentle stretch behind your right / left knee or thigh. Hold this position for __________ seconds. Repeat __________ times. Complete this stretch __________ times per day.  STRETCH - Hamstrings, Doorway  Lie on your back with your right / left leg extended and resting on the wall, and the opposite leg flat on the ground through the door. Initially, position your bottom farther away from the wall.  Keep your right / left knee straight. If you feel a stretch behind your knee or thigh, hold this position for __________ seconds.  If you do not feel a stretch, scoot your bottom closer to the door and hold __________ seconds. Repeat __________ times. Complete this stretch __________ times per day.  STRETCH - Hamstrings/Adductors, V-Sit   Sit on the floor with your legs extended in a large "V," keeping your knees straight.  With your head and chest upright, bend at your waist reaching for your left foot to stretch your right thigh muscles.  You should feel a stretch in your right inner thigh. Hold for __________ seconds.  Return to the upright position to relax your leg muscles.  Continuing to keep your chest upright, bend straight forward at your   waist to stretch your hamstrings.  You should feel a stretch behind both of your thighs and knees. Hold for __________ seconds.  Return to the upright position to relax your leg muscles.  With your head and chest upright, bend at your waist reaching for your right foot to stretch your left thigh muscles.  You should feel a stretch in your left inner thigh. Hold for __________ seconds.  Return to the upright position to relax your leg muscles. Repeat __________ times. Complete this exercise __________ times per day.  STRENGTHENING EXERCISES - Hamstring Strain These exercises may help you  when beginning to rehabilitate your injury. They may resolve your symptoms with or without further involvement from your physician, physical therapist or athletic trainer. While completing these exercises, remember:   Muscles can gain both the endurance and the strength needed for everyday activities through controlled exercises.  Complete these exercises as instructed by your physician, physical therapist or athletic trainer. Increase the resistance and repetitions only as guided.  You may experience muscle soreness or fatigue, but the pain or discomfort you are trying to eliminate should never get worse during these exercises. If this pain does get worse, stop and make certain you are following the directions exactly. If the pain is still present after adjustments, discontinue the exercise until you can discuss the trouble with your clinician. STRENGTH - Hip Extensors, Straight Leg Raises   Lie on your stomach on a firm surface.  Tense the muscles in your buttocks to lift your right / left leg about 4 inches. If you cannot lift your leg this high without arching your back, place a pillow under your hips.  Keep your knee straight. Hold for __________ seconds.  Slowly lower your leg to the starting position and allow it to relax completely before starting the next repetition. Repeat __________ times. Complete this exercise __________ times per day.  STRENGTH - Hamstring, Isometrics   Lie on your back on a firm surface.  Bend your right / left knee approximately __________ degrees.  Dig your heel into the surface, as if you are trying to pull it toward your buttocks. Tighten the muscles in the back of your thighs to "dig" as hard as you can, without increasing any pain.  Hold this position for __________ seconds.  Release the tension gradually and allow your muscles to completely relax for __________ seconds between each exercise. Repeat __________ times. Complete this exercise __________  times per day.  STRENGTH - Hamstring, Curls   Lay on your stomach with your legs extended. (If you lay on a bed, your feet may hang over the edge.)  Tighten the muscles in the back of your thigh to bend your right / left knee up to 90 degrees. Keep your hips flat on the bed or floor.  Hold this position for __________ seconds.  Slowly lower your leg back to the starting position. Repeat __________ times. Complete this exercise __________ times per day.  OPTIONAL ANKLE WEIGHTS: Begin with ____________________, but DO NOT exceed ____________________. Increase in 1 pound/0.5 kilogram increments.   This information is not intended to replace advice given to you by your health care provider. Make sure you discuss any questions you have with your health care provider.   Document Released: 11/22/2005 Document Revised: 12/13/2014 Document Reviewed: 03/06/2009 Elsevier Interactive Patient Education 2016 [employer]  

## 2015-09-13 NOTE — ED Provider Notes (Signed)
CSN: [id number]     Arrival date & time 09/13/15  1050 History  By signing my name below, I, Soijett Blue, attest that this documentation has been prepared under the direction and in the presence of Montine Circle, PA-C Electronically Signed: Soijett Blue, ED Scribe. 09/13/2015. 11:28 AM.   Chief Complaint  Patient presents with  . Leg Pain      The history is provided by the patient. No language interpreter was used.    Sarah Long is a 44 y.o. female who presents to the Emergency Department complaining of right leg pain onset last night. She notes that she fell on her kitchen floor last night while dancing. She reports that she thinks she pulled a muscle due to it being difficult to bear weight on her right leg. She denies color change, wound, rash, and any other symptoms. She notes that her PCP was at [facility] but her PCP left and she hasn't been reassigned yet.   Past Medical History  Diagnosis Date  . Fibroid   . Preterm labor   . Herpes   . Bipolar 1 disorder ([place])   . Hypertension     on aldomet  . Diabetes mellitus     nph 20U qam and qpm, regular with meals  . Diabetes in pregnancy ([place])   . Headache(784.0)   . Depression     recently added wellbutrin-has not taken yet for bipolar  . H/O varicella   . Abnormal Pap smear 1998  . Trichomonas   . Yeast infection   . Bacterial infection   . Galactorrhea of right breast 2008  . Oligomenorrhea 07/2007  . Candida vaginitis 07/2007  . H/O amenorrhea 07/2007  . Hx: UTI (urinary tract infection) 2009  . Pelvic pain in female   . HSV-2 infection 01/03/2009  . H/O dysmenorrhea 2010  . Increased BMI 2010  . Heavy vaginal bleeding due to contraceptive injection use 10/12/2011    Depo Provera  . H/O menorrhagia 10/14/11  . Obesity 10/14/11  . H/O dizziness 10/14/11  . Abnormal vaginal bleeding 12/19/2011   Past Surgical History  Procedure Laterality Date  . Cesarean section  1991   Family History  Problem  Relation Age of Onset  . Hypertension Mother   . Diabetes Mother   . Heart disease Mother   . Hypertension Father   . Diabetes Father   . Heart disease Father   . Other Neg Hx   . Stroke Maternal Grandfather    Social History  Substance Use Topics  . Smoking status: Current Every Day Smoker -- 0.50 packs/day for 20 years    Types: Cigarettes  . Smokeless tobacco: Never Used  . Alcohol Use: No   OB History    Gravida Para Term Preterm AB TAB SAB Ectopic Multiple Living   '6 4 1 3 2 1 1 '$ 0 0 4     Review of Systems  Musculoskeletal: Positive for myalgias. Negative for joint swelling.  Skin: Negative for color change, rash and wound.    Allergies  Sulfa antibiotics and Strawberry  Home Medications   Prior to Admission medications   Medication Sig Start Date End Date Taking? Authorizing Provider  Dextromethorphan-Guaifenesin (ROBITUSSIN DM PO) Take 5 mLs by mouth 4 (four) times daily as needed (cold).    Historical Provider, MD  diphenhydramine-acetaminophen (TYLENOL PM) 25-500 MG TABS Take 2 tablets by mouth at bedtime as needed (sleep).    Historical Provider, MD  fluconazole (DIFLUCAN)  150 MG tablet Take 1 tablet (150 mg total) by mouth once. Repeat in 1 week Patient not taking: Reported on 07/16/2015 07/14/15   Billy Fischer, MD  glucose monitoring kit (FREESTYLE) monitoring kit 1 each by Does not apply route 4 (four) times daily - after meals and at bedtime. 1 month Diabetic Testing Supplies for QAC-QHS accuchecks. 01/30/15   Lorayne Marek, MD  ibuprofen (ADVIL,MOTRIN) 800 MG tablet Take 1 tablet (800 mg total) by mouth 3 (three) times daily. 09/13/15   Montine Circle, PA-C  Lactobacillus (PROBIOTIC ACIDOPHILUS PO) Take 1 capsule by mouth daily.    Historical Provider, MD  lisinopril-hydrochlorothiazide (PRINZIDE,ZESTORETIC) 10-12.5 MG per tablet Take 1 tablet by mouth daily. 01/30/15   Lorayne Marek, MD  metFORMIN (GLUCOPHAGE) 1000 MG tablet Take 1 tablet (1,000 mg total) by  mouth 2 (two) times daily with a meal. Patient not taking: Reported on 07/16/2015 01/30/15   Lorayne Marek, MD  Miscellaneous Vaginal Products ([facility]) Place 1 applicator vaginally daily as needed (yeast problems).    Historical Provider, MD  naproxen sodium (ALEVE) 220 MG tablet Take 440 mg by mouth 2 (two) times daily as needed (pain).    Historical Provider, MD  Pseudoephedrine-APAP-DM (DAYQUIL PO) Take 2 tablets by mouth 4 (four) times daily as needed (cough).    Historical Provider, MD  terconazole (TERAZOL 3) 80 MG vaginal suppository Place 1 suppository (80 mg total) vaginally at bedtime. 07/14/15   Billy Fischer, MD  terconazole (TERAZOL 7) 0.4 % vaginal cream Place 1 applicator vaginally at bedtime. For 7 nights 07/16/15   Tresea Mall, CNM  valACYclovir (VALTREX) 500 MG tablet Take 1 tablet (500 mg total) by mouth 2 (two) times daily. 07/16/15   Heather D Norman Herrlich, CNM   BP 167/97 mmHg  Pulse 100  Temp(Src) 98.1 F (36.7 C) (Oral)  Resp 16  Ht $R'5\' 4"'xY$  (1.626 m)  Wt 189 lb (85.73 kg)  BMI 32.43 kg/m2  SpO2 100%  LMP 09/06/2015 Physical Exam  Constitutional: She is oriented to person, place, and time. She appears well-developed and well-nourished. No distress.  HENT:  Head: Normocephalic and atraumatic.  Eyes: EOM are normal.  Neck: Neck supple.  Cardiovascular: Normal rate.   Pulmonary/Chest: Effort normal. No respiratory distress.  Abdominal: Soft. There is no tenderness.  Musculoskeletal: Normal range of motion.  Right hamstring TTP. Increased pain with knee flexion. No muscle bulge. No contusion.  Neurological: She is alert and oriented to person, place, and time.  Skin: Skin is warm and dry.  Psychiatric: She has a normal mood and affect. Her behavior is normal.  Nursing note and vitals reviewed.   ED Course  Procedures (including critical care time) DIAGNOSTIC STUDIES: Oxygen Saturation is 100% on RA, nl by my interpretation.    COORDINATION OF  CARE: 11:22 AM Discussed treatment plan with pt at bedside which includes ibuprofen Rx, crutches, ice, referral to orthopedist, and pt agreed to plan.      MDM   Final diagnoses:  Hamstring injury, right, initial encounter    Patient with right hamstring strain. No evidence of complete tear. No contusions. Will give crutches and recommend orthopedic follow-up. Recommend rice therapy and NSAIDs. Patient understands agrees the plan. She is stable and ready for discharge.  I, Carmeline Kowal, personally performed the services described in this documentation. All medical record entries made by the scribe were at my direction and in my presence.  I have reviewed the chart and discharge instructions  and agree that the record reflects my personal performance and is accurate and complete. Orpha Dain.  09/13/2015. 11:45 AM.      Montine Circle, PA-C 09/13/15 [place], MD 09/14/15 [phone]

## 2015-11-16 ENCOUNTER — Emergency Department (HOSPITAL_COMMUNITY)
Admission: EM | Admit: 2015-11-16 | Discharge: 2015-11-17 | Disposition: A | Discharge status: Home / Self Care | Payer: Medicare Other | Attending: Emergency Medicine | Admitting: Emergency Medicine

## 2015-11-16 ENCOUNTER — Encounter (HOSPITAL_COMMUNITY): Payer: Self-pay

## 2015-11-16 DIAGNOSIS — Z79899 Other long term (current) drug therapy: Secondary | ICD-10-CM | POA: Diagnosis not present

## 2015-11-16 DIAGNOSIS — F17210 Nicotine dependence, cigarettes, uncomplicated: Secondary | ICD-10-CM | POA: Insufficient documentation

## 2015-11-16 DIAGNOSIS — Z8659 Personal history of other mental and behavioral disorders: Secondary | ICD-10-CM | POA: Insufficient documentation

## 2015-11-16 DIAGNOSIS — I10 Essential (primary) hypertension: Secondary | ICD-10-CM | POA: Diagnosis not present

## 2015-11-16 DIAGNOSIS — L0501 Pilonidal cyst with abscess: Secondary | ICD-10-CM | POA: Diagnosis not present

## 2015-11-16 DIAGNOSIS — E1165 Type 2 diabetes mellitus with hyperglycemia: Secondary | ICD-10-CM | POA: Diagnosis not present

## 2015-11-16 DIAGNOSIS — Z8742 Personal history of other diseases of the female genital tract: Secondary | ICD-10-CM | POA: Diagnosis not present

## 2015-11-16 DIAGNOSIS — L0291 Cutaneous abscess, unspecified: Secondary | ICD-10-CM

## 2015-11-16 DIAGNOSIS — L0231 Cutaneous abscess of buttock: Secondary | ICD-10-CM | POA: Diagnosis not present

## 2015-11-16 DIAGNOSIS — Z86018 Personal history of other benign neoplasm: Secondary | ICD-10-CM | POA: Diagnosis not present

## 2015-11-16 DIAGNOSIS — Z8632 Personal history of gestational diabetes: Secondary | ICD-10-CM | POA: Diagnosis not present

## 2015-11-16 DIAGNOSIS — E669 Obesity, unspecified: Secondary | ICD-10-CM | POA: Insufficient documentation

## 2015-11-16 DIAGNOSIS — Z8619 Personal history of other infectious and parasitic diseases: Secondary | ICD-10-CM | POA: Insufficient documentation

## 2015-11-16 DIAGNOSIS — Z87440 Personal history of urinary (tract) infections: Secondary | ICD-10-CM | POA: Diagnosis not present

## 2015-11-16 DIAGNOSIS — E139 Other specified diabetes mellitus without complications: Secondary | ICD-10-CM

## 2015-11-16 DIAGNOSIS — Z791 Long term (current) use of non-steroidal anti-inflammatories (NSAID): Secondary | ICD-10-CM | POA: Insufficient documentation

## 2015-11-16 DIAGNOSIS — Z794 Long term (current) use of insulin: Secondary | ICD-10-CM | POA: Diagnosis not present

## 2015-11-16 DIAGNOSIS — R739 Hyperglycemia, unspecified: Secondary | ICD-10-CM

## 2015-11-16 LAB — CBG MONITORING, ED: Glucose-Capillary: 461 mg/dL — ABNORMAL HIGH (ref 65–99)

## 2015-11-16 MED ORDER — LIDOCAINE-EPINEPHRINE (PF) 2 %-1:200000 IJ SOLN
10.0000 mL | Freq: Once | INTRAMUSCULAR | Status: AC
  Administered 2015-11-16: 10 mL
  Filled 2015-11-16: qty 20

## 2015-11-16 MED ORDER — INSULIN ASPART 100 UNIT/ML ~~LOC~~ SOLN
10.0000 [IU] | Freq: Once | SUBCUTANEOUS | Status: AC
  Administered 2015-11-17: 10 [IU] via SUBCUTANEOUS
  Filled 2015-11-16: qty 1

## 2015-11-16 MED ORDER — LORAZEPAM 1 MG PO TABS
1.0000 mg | ORAL_TABLET | Freq: Once | ORAL | Status: AC
  Administered 2015-11-16: 1 mg via ORAL
  Filled 2015-11-16: qty 1

## 2015-11-16 NOTE — ED Provider Notes (Signed)
CSN: [id number]     Arrival date & time 11/16/15  2221 History   First MD Initiated Contact with Patient 11/16/15 2240     Chief Complaint  Patient presents with  . Abscess     (Consider location/radiation/quality/duration/timing/severity/associated sxs/prior Treatment) HPI Comments: Patient with PMH of DM and bipolar presents to the ED with a chief complaint of abscess.  She states that she has had a slowly worsening abscess on the top of her right buttock for the past 3-4 days.  She has no tried taking anything for her symptoms.  She states that it hurts to sit and walk.  She denies any fevers or chills.  She states that she has not been taking her medications because her PCP left the practice.  She has been out of her medications for the past several months.  She denies any other associated symptoms.  The history is provided by the patient. No language interpreter was used.    Past Medical History  Diagnosis Date  . Fibroid   . Preterm labor   . Herpes   . Bipolar 1 disorder ([place])   . Hypertension     on aldomet  . Diabetes mellitus     nph 20U qam and qpm, regular with meals  . Diabetes in pregnancy ([place])   . Headache(784.0)   . Depression     recently added wellbutrin-has not taken yet for bipolar  . H/O varicella   . Abnormal Pap smear 1998  . Trichomonas   . Yeast infection   . Bacterial infection   . Galactorrhea of right breast 2008  . Oligomenorrhea 07/2007  . Candida vaginitis 07/2007  . H/O amenorrhea 07/2007  . Hx: UTI (urinary tract infection) 2009  . Pelvic pain in female   . HSV-2 infection 01/03/2009  . H/O dysmenorrhea 2010  . Increased BMI 2010  . Heavy vaginal bleeding due to contraceptive injection use 10/12/2011    Depo Provera  . H/O menorrhagia 10/14/11  . Obesity 10/14/11  . H/O dizziness 10/14/11  . Abnormal vaginal bleeding 12/19/2011   Past Surgical History  Procedure Laterality Date  . Cesarean section  1991   Family History  Problem  Relation Age of Onset  . Hypertension Mother   . Diabetes Mother   . Heart disease Mother   . Hypertension Father   . Diabetes Father   . Heart disease Father   . Other Neg Hx   . Stroke Maternal Grandfather    Social History  Substance Use Topics  . Smoking status: Current Every Day Smoker -- 0.50 packs/day for 20 years    Types: Cigarettes  . Smokeless tobacco: Never Used  . Alcohol Use: No   OB History    Gravida Para Term Preterm AB TAB SAB Ectopic Multiple Living   '6 4 1 3 2 1 1 '$ 0 0 4     Review of Systems  Constitutional: Negative for fever and chills.  Respiratory: Negative for shortness of breath.   Cardiovascular: Negative for chest pain.  Gastrointestinal: Negative for nausea, vomiting, diarrhea and constipation.  Genitourinary: Negative for dysuria.  Skin:       Buttock abscess  All other systems reviewed and are negative.     Allergies  Sulfa antibiotics and Strawberry extract  Home Medications   Prior to Admission medications   Medication Sig Start Date End Date Taking? Authorizing Provider  Dextromethorphan-Guaifenesin (ROBITUSSIN DM PO) Take 5 mLs by mouth 4 (four) times daily as needed (  cold).    Historical Provider, MD  diphenhydramine-acetaminophen (TYLENOL PM) 25-500 MG TABS Take 2 tablets by mouth at bedtime as needed (sleep).    Historical Provider, MD  fluconazole (DIFLUCAN) 150 MG tablet Take 1 tablet (150 mg total) by mouth once. Repeat in 1 week Patient not taking: Reported on 07/16/2015 07/14/15   Linna Hoff, MD  glucose monitoring kit (FREESTYLE) monitoring kit 1 each by Does not apply route 4 (four) times daily - after meals and at bedtime. 1 month Diabetic Testing Supplies for QAC-QHS accuchecks. 01/30/15   Doris Cheadle, MD  ibuprofen (ADVIL,MOTRIN) 800 MG tablet Take 1 tablet (800 mg total) by mouth 3 (three) times daily. 09/13/15   Roxy Horseman, PA-C  Lactobacillus (PROBIOTIC ACIDOPHILUS PO) Take 1 capsule by mouth daily.    Historical  Provider, MD  lisinopril-hydrochlorothiazide (PRINZIDE,ZESTORETIC) 10-12.5 MG per tablet Take 1 tablet by mouth daily. 01/30/15   Doris Cheadle, MD  metFORMIN (GLUCOPHAGE) 1000 MG tablet Take 1 tablet (1,000 mg total) by mouth 2 (two) times daily with a meal. Patient not taking: Reported on 07/16/2015 01/30/15   Doris Cheadle, MD  Miscellaneous Vaginal Products [facility]) Place 1 applicator vaginally daily as needed (yeast problems).    Historical Provider, MD  naproxen sodium (ALEVE) 220 MG tablet Take 440 mg by mouth 2 (two) times daily as needed (pain).    Historical Provider, MD  Pseudoephedrine-APAP-DM (DAYQUIL PO) Take 2 tablets by mouth 4 (four) times daily as needed (cough).    Historical Provider, MD  terconazole (TERAZOL 3) 80 MG vaginal suppository Place 1 suppository (80 mg total) vaginally at bedtime. 07/14/15   Linna Hoff, MD  terconazole (TERAZOL 7) 0.4 % vaginal cream Place 1 applicator vaginally at bedtime. For 7 nights 07/16/15   Armando Reichert, CNM  valACYclovir (VALTREX) 500 MG tablet Take 1 tablet (500 mg total) by mouth 2 (two) times daily. 07/16/15   Heather D Mathews Robinsons, CNM   BP 175/109 mmHg  Pulse 137  Temp(Src) 97.5 F (36.4 C) (Oral)  Resp 20  Ht 5\' 4"  (1.626 m)  Wt 90.719 kg  BMI 34.31 kg/m2  SpO2 100%  LMP 11/10/2015 Physical Exam  Constitutional: She is oriented to person, place, and time. She appears well-developed and well-nourished.  HENT:  Head: Normocephalic and atraumatic.  Eyes: Conjunctivae and EOM are normal. Pupils are equal, round, and reactive to light.  Neck: Normal range of motion. Neck supple.  Cardiovascular: Normal rate and regular rhythm.  Exam reveals no gallop and no friction rub.   No murmur heard. Pulmonary/Chest: Effort normal and breath sounds normal. No respiratory distress. She has no wheezes. She has no rales. She exhibits no tenderness.  Abdominal: Soft. Bowel sounds are normal. She exhibits no distension and no mass.  There is no tenderness. There is no rebound and no guarding.  Genitourinary:  Pilonidal abscess 3x3 cm, upper right buttock, no rectal involvement  Musculoskeletal: Normal range of motion. She exhibits no edema or tenderness.  Neurological: She is alert and oriented to person, place, and time.  Skin: Skin is warm and dry.  Psychiatric: She has a normal mood and affect. Her behavior is normal. Judgment and thought content normal.  Nursing note and vitals reviewed.   ED Course  Procedures (including critical care time) Labs Review Labs Reviewed  CBG MONITORING, ED - Abnormal; Notable for the following:    Glucose-Capillary 461 (*)    All other components within normal limits  INCISION AND DRAINAGE Performed by: Montine Circle Consent: Verbal consent obtained. Risks and benefits: risks, benefits and alternatives were discussed Type: abscess  Body area: right upper buttock  Anesthesia: local infiltration  Incision was made with a scalpel.  Local anesthetic: lidocaine 2% with epinephrine  Anesthetic total: 5 ml  Complexity: complex Blunt dissection to break up loculations  Drainage: purulent  Drainage amount: moderate  Packing material: 1/4 in iodoform gauze  Patient tolerance: Patient tolerated the procedure well with no immediate complications.     MDM   Final diagnoses:  Abscess  Hyperglycemia    Patient with skin abscess amenable to incision and drainage. Patient will be started on antibiotics. She has not had her insulin in 3 months. I will give her a dose of 10 units here.  Patient will be discharged with PCP follow-up.  Will refill patient's medications.  HR was intially quite fast, she was very nervous and anxious.  Feels much better now.  She is non-toxic appearing.  No abdominal pain, n/v/d.  Doubt DKA.  Patient discussed with Dr. Maryan Rued, who agrees with the plan.     Montine Circle, PA-C 11/17/15 [id number]  Blanchie Dessert, MD 11/19/15 2151

## 2015-11-16 NOTE — ED Notes (Signed)
CHECKED CBG 461, RN ANNA INFORMED

## 2015-11-16 NOTE — ED Notes (Signed)
Pt here for abscess to right buttock. Noticed it 3-4 days ago. Hurts to sit. Pt is diabetic.

## 2015-11-17 DIAGNOSIS — L0501 Pilonidal cyst with abscess: Secondary | ICD-10-CM | POA: Diagnosis not present

## 2015-11-17 MED ORDER — LISINOPRIL-HYDROCHLOROTHIAZIDE 10-12.5 MG PO TABS: 1.0000 | ORAL_TABLET | Freq: Every day | ORAL | Status: DC

## 2015-11-17 MED ORDER — METFORMIN HCL 1000 MG PO TABS: 1000.0000 mg | ORAL_TABLET | Freq: Two times a day (BID) | ORAL | Status: DC

## 2015-11-17 MED ORDER — HYDROCODONE-ACETAMINOPHEN 5-325 MG PO TABS: 1.0000 | ORAL_TABLET | Freq: Four times a day (QID) | ORAL | Status: DC | PRN

## 2015-11-17 MED ORDER — DOXYCYCLINE HYCLATE 100 MG PO CAPS: 100.0000 mg | ORAL_CAPSULE | Freq: Two times a day (BID) | ORAL | Status: DC

## 2015-11-17 MED ORDER — INSULIN DETEMIR 100 UNIT/ML ~~LOC~~ SOLN: 10.0000 [IU] | Freq: Two times a day (BID) | SUBCUTANEOUS | Status: DC

## 2015-11-17 NOTE — Discharge Instructions (Signed)

## 2015-11-17 NOTE — ED Notes (Signed)
Covered wound with sterile gauze and tape and provided a couple extra gauze packets for patient's use at home. Patient verbalized understanding of discharge instructions and denies any further needs or questions at this time. VS stable, patient ambulatory with steady gait. Assisted patient to ED lobby in wheelchair.

## 2015-11-29 DIAGNOSIS — E109 Type 1 diabetes mellitus without complications: Secondary | ICD-10-CM | POA: Diagnosis not present

## 2015-12-22 ENCOUNTER — Other Ambulatory Visit (HOSPITAL_COMMUNITY)
Admission: RE | Admit: 2015-12-22 | Discharge: 2015-12-22 | Disposition: A | Discharge status: Home / Self Care | Payer: Medicare Other | Source: Ambulatory Visit | Attending: Internal Medicine | Admitting: Internal Medicine

## 2015-12-22 ENCOUNTER — Encounter: Payer: Self-pay | Admitting: Internal Medicine

## 2015-12-22 ENCOUNTER — Ambulatory Visit: Payer: Medicare Other | Attending: Internal Medicine | Admitting: Internal Medicine

## 2015-12-22 VITALS — BP 137/91 | HR 92 | Temp 98.6°F | Resp 17 | Ht 64.0 in | Wt 200.0 lb

## 2015-12-22 DIAGNOSIS — L298 Other pruritus: Secondary | ICD-10-CM | POA: Diagnosis not present

## 2015-12-22 DIAGNOSIS — I10 Essential (primary) hypertension: Secondary | ICD-10-CM | POA: Diagnosis not present

## 2015-12-22 DIAGNOSIS — N898 Other specified noninflammatory disorders of vagina: Secondary | ICD-10-CM

## 2015-12-22 DIAGNOSIS — Z0000 Encounter for general adult medical examination without abnormal findings: Secondary | ICD-10-CM

## 2015-12-22 DIAGNOSIS — Z888 Allergy status to other drugs, medicaments and biological substances status: Secondary | ICD-10-CM | POA: Diagnosis not present

## 2015-12-22 DIAGNOSIS — B009 Herpesviral infection, unspecified: Secondary | ICD-10-CM | POA: Insufficient documentation

## 2015-12-22 DIAGNOSIS — E119 Type 2 diabetes mellitus without complications: Secondary | ICD-10-CM | POA: Diagnosis not present

## 2015-12-22 DIAGNOSIS — N611 Abscess of the breast and nipple: Secondary | ICD-10-CM

## 2015-12-22 DIAGNOSIS — F319 Bipolar disorder, unspecified: Secondary | ICD-10-CM | POA: Diagnosis not present

## 2015-12-22 DIAGNOSIS — Z79899 Other long term (current) drug therapy: Secondary | ICD-10-CM | POA: Insufficient documentation

## 2015-12-22 DIAGNOSIS — R030 Elevated blood-pressure reading, without diagnosis of hypertension: Secondary | ICD-10-CM

## 2015-12-22 DIAGNOSIS — Z1239 Encounter for other screening for malignant neoplasm of breast: Secondary | ICD-10-CM

## 2015-12-22 DIAGNOSIS — F17210 Nicotine dependence, cigarettes, uncomplicated: Secondary | ICD-10-CM | POA: Diagnosis not present

## 2015-12-22 DIAGNOSIS — Z01419 Encounter for gynecological examination (general) (routine) without abnormal findings: Secondary | ICD-10-CM | POA: Diagnosis not present

## 2015-12-22 DIAGNOSIS — IMO0001 Reserved for inherently not codable concepts without codable children: Secondary | ICD-10-CM

## 2015-12-22 DIAGNOSIS — N760 Acute vaginitis: Secondary | ICD-10-CM | POA: Insufficient documentation

## 2015-12-22 DIAGNOSIS — Z794 Long term (current) use of insulin: Secondary | ICD-10-CM | POA: Diagnosis not present

## 2015-12-22 DIAGNOSIS — Z7984 Long term (current) use of oral hypoglycemic drugs: Secondary | ICD-10-CM | POA: Insufficient documentation

## 2015-12-22 DIAGNOSIS — E669 Obesity, unspecified: Secondary | ICD-10-CM | POA: Diagnosis not present

## 2015-12-22 DIAGNOSIS — Z01411 Encounter for gynecological examination (general) (routine) with abnormal findings: Secondary | ICD-10-CM | POA: Diagnosis not present

## 2015-12-22 DIAGNOSIS — Z113 Encounter for screening for infections with a predominantly sexual mode of transmission: Secondary | ICD-10-CM | POA: Diagnosis not present

## 2015-12-22 DIAGNOSIS — Z1151 Encounter for screening for human papillomavirus (HPV): Secondary | ICD-10-CM | POA: Diagnosis not present

## 2015-12-22 LAB — LIPID PANEL
CHOLESTEROL: 165 mg/dL (ref 125–200)
HDL: 57 mg/dL (ref >=46)
LDL Cholesterol: 90 mg/dL (ref <130)
TRIGLYCERIDES: 90 mg/dL (ref <150)
Total CHOL/HDL Ratio: 2.9 Ratio (ref <=5.0)
VLDL: 18 mg/dL (ref <30)

## 2015-12-22 LAB — COMPLETE METABOLIC PANEL WITH GFR
ALBUMIN: 3.8 g/dL (ref 3.6–5.1)
ALK PHOS: 63 U/L (ref 33–115)
ALT: 46 U/L — AB (ref 6–29)
AST: 68 U/L — AB (ref 10–30)
BUN: 7 mg/dL (ref 7–25)
CALCIUM: 9.3 mg/dL (ref 8.6–10.2)
CHLORIDE: 100 mmol/L (ref 98–110)
CO2: 25 mmol/L (ref 20–31)
CREATININE: 0.57 mg/dL (ref 0.50–1.10)
GFR, Est Non African American: >89 mL/min (ref >=60)
Glucose, Bld: 215 mg/dL — ABNORMAL HIGH (ref 65–99)
Potassium: 3.9 mmol/L (ref 3.5–5.3)
Sodium: 136 mmol/L (ref 135–146)
Total Bilirubin: 0.3 mg/dL (ref 0.2–1.2)
Total Protein: 6.7 g/dL (ref 6.1–8.1)

## 2015-12-22 LAB — GLUCOSE, POCT (MANUAL RESULT ENTRY)
POC GLUCOSE: 264 mg/dL — AB (ref 70–99)
POC GLUCOSE: 243 mg/dL — AB (ref 70–99)

## 2015-12-22 LAB — POCT URINALYSIS DIPSTICK
GLUCOSE UA: 500
Ketones, UA: NEGATIVE
Leukocytes, UA: NEGATIVE
Nitrite, UA: NEGATIVE
Protein, UA: 100
RBC UA: NEGATIVE
UROBILINOGEN UA: 0.2
pH, UA: 5.5

## 2015-12-22 LAB — POCT GLYCOSYLATED HEMOGLOBIN (HGB A1C): HEMOGLOBIN A1C: 12.5

## 2015-12-22 MED ORDER — LISINOPRIL-HYDROCHLOROTHIAZIDE 10-12.5 MG PO TABS: 1.0000 | ORAL_TABLET | Freq: Every day | ORAL | Status: DC

## 2015-12-22 MED ORDER — CLONIDINE HCL 0.1 MG PO TABS
0.1000 mg | ORAL_TABLET | Freq: Once | ORAL | Status: AC
  Administered 2015-12-22: 0.1 mg via ORAL

## 2015-12-22 MED ORDER — TRUEPLUS LANCETS 28G MISC: Status: DC

## 2015-12-22 MED ORDER — SAXAGLIPTIN-METFORMIN ER 5-1000 MG PO TB24: 1.0000 | ORAL_TABLET | Freq: Every day | ORAL | Status: DC

## 2015-12-22 MED ORDER — INSULIN DETEMIR 100 UNIT/ML ~~LOC~~ SOLN: SUBCUTANEOUS | Status: DC

## 2015-12-22 MED ORDER — TRUE METRIX METER W/DEVICE KIT: PACK | Status: DC

## 2015-12-22 MED ORDER — GLUCOSE BLOOD VI STRP: ORAL_STRIP | Status: DC

## 2015-12-22 MED ORDER — INSULIN ASPART 100 UNIT/ML ~~LOC~~ SOLN
10.0000 [IU] | Freq: Once | SUBCUTANEOUS | Status: AC
  Administered 2015-12-22: 10 [IU] via SUBCUTANEOUS

## 2015-12-22 MED ORDER — INFLUENZA VAC SPLIT QUAD 0.5 ML IM SUSY: 0.5000 mL | PREFILLED_SYRINGE | INTRAMUSCULAR | Status: DC

## 2015-12-22 MED ORDER — DOXYCYCLINE HYCLATE 100 MG PO CAPS: 100.0000 mg | ORAL_CAPSULE | Freq: Two times a day (BID) | ORAL | Status: DC

## 2015-12-22 MED ORDER — "INSULIN SYRINGE-NEEDLE U-100 30G X 5/16"" 1 ML MISC": Status: DC

## 2015-12-22 NOTE — Progress Notes (Signed)
Patient here to re establish care with her new provider Has not taken any of her medications for the  Six months Has lost her medicaid and everything got messed up Patient present with elevated blood sugar and blood pressure 10 units of novolog given per office protocol 0.1mg  catapress given for her elevated blood pressure Patient also complains of having some vaginal itching and burning with a slight odor

## 2015-12-22 NOTE — Addendum Note (Signed)
Addended by: Chari Manning A on: 12/22/2015 06:04 PM   Modules accepted: Orders

## 2015-12-22 NOTE — Patient Instructions (Signed)
I have placed you back on Kombiglyze. So I have decreased your insulin dose to see how you will adjust. I want you to come back in 2 weeks and we can slowly go back up on insulin dose as needed.  Insulin in 10 units in AM and 20 units in evening.

## 2015-12-22 NOTE — Progress Notes (Signed)
Patient ID: Sarah Long, female   DOB: 01/11/1971, 45 y.o.   MRN: [id number]  CC: DM, HTN  HPI: Sarah Long is a 45 y.o. female here today for a follow up visit.  Patient has past medical history of diabetes, HTN, bipolar, depression, and HSV-2. Patient reports that she has been off all of her medications for the past 6 months due to financial concerns. Patient was previously on Levemir 10 units in AM and 70 at bedtime. She states that she does not have a glucometer and has been unable to check her sugars. She stopped Metformin due to diarrhea. She endorses polyuria, polydipsia, blurred vision, neuropathy in BLE. Stabbing pain in ankle and arms at bedtime.   She does not check her blood pressures at home. She denies chest pain, palpitations, edema, headaches.  Vaginal itching and burning for the past 3-4 months. Has tried OTC creams with no relief. No new sexual partners. She refuses HIV testing today.    Allergies  Allergen Reactions  . Strawberry Extract Hives  . Sulfa Antibiotics Hives and Itching   Past Medical History  Diagnosis Date  . Fibroid   . Preterm labor   . Herpes   . Bipolar 1 disorder ([place])   . Hypertension     on aldomet  . Diabetes mellitus     nph 20U qam and qpm, regular with meals  . Diabetes in pregnancy ([place])   . Headache(784.0)   . Depression     recently added wellbutrin-has not taken yet for bipolar  . H/O varicella   . Abnormal Pap smear 1998  . Trichomonas   . Yeast infection   . Bacterial infection   . Galactorrhea of right breast 2008  . Oligomenorrhea 07/2007  . Candida vaginitis 07/2007  . H/O amenorrhea 07/2007  . Hx: UTI (urinary tract infection) 2009  . Pelvic pain in female   . HSV-2 infection 01/03/2009  . H/O dysmenorrhea 2010  . Increased BMI 2010  . Heavy vaginal bleeding due to contraceptive injection use 10/12/2011    Depo Provera  . H/O menorrhagia 10/14/11  . Obesity 10/14/11  . H/O dizziness 10/14/11  . Abnormal vaginal bleeding  12/19/2011   Current Outpatient Prescriptions on File Prior to Visit  Medication Sig Dispense Refill  . doxycycline (VIBRAMYCIN) 100 MG capsule Take 1 capsule (100 mg total) by mouth 2 (two) times daily. 20 capsule 0  . fluconazole (DIFLUCAN) 150 MG tablet Take 1 tablet (150 mg total) by mouth once. Repeat in 1 week (Patient not taking: Reported on 07/16/2015) 1 tablet 1  . glucose monitoring kit (FREESTYLE) monitoring kit 1 each by Does not apply route 4 (four) times daily - after meals and at bedtime. 1 month Diabetic Testing Supplies for QAC-QHS accuchecks. 1 each 1  . HYDROcodone-acetaminophen (NORCO/VICODIN) 5-325 MG tablet Take 1-2 tablets by mouth every 6 (six) hours as needed. 10 tablet 0  . ibuprofen (ADVIL,MOTRIN) 800 MG tablet Take 1 tablet (800 mg total) by mouth 3 (three) times daily. 21 tablet 0  . insulin detemir (LEVEMIR) 100 UNIT/ML injection Inject 0.1 mLs (10 Units total) into the skin 2 (two) times daily. Start with 10 units in morning and 10 units at night. 10 mL 0  . lisinopril-hydrochlorothiazide (PRINZIDE,ZESTORETIC) 10-12.5 MG tablet Take 1 tablet by mouth daily. 90 tablet 3  . metFORMIN (GLUCOPHAGE) 1000 MG tablet Take 1 tablet (1,000 mg total) by mouth 2 (two) times daily with a meal. 180 tablet 3  .  naproxen sodium (ALEVE) 220 MG tablet Take 440 mg by mouth 2 (two) times daily as needed (pain).    [name] terconazole (TERAZOL 3) 80 MG vaginal suppository Place 1 suppository (80 mg total) vaginally at bedtime. 3 suppository 0  . terconazole (TERAZOL 7) 0.4 % vaginal cream Place 1 applicator vaginally at bedtime. For 7 nights 45 g 0  . valACYclovir (VALTREX) 500 MG tablet Take 1 tablet (500 mg total) by mouth 2 (two) times daily. 60 tablet 0  . [DISCONTINUED] medroxyPROGESTERone (DEPO-PROVERA) 150 MG/ML injection Inject 1 mL (150 mg total) into the muscle every 3 (three) months. 1 mL 0   No current facility-administered medications on file prior to visit.   Family History   Problem Relation Age of Onset  . Hypertension Mother   . Diabetes Mother   . Heart disease Mother   . Hypertension Father   . Diabetes Father   . Heart disease Father   . Other Neg Hx   . Stroke Maternal Grandfather    Social History   Social History  . Marital Status: Married    Spouse Name: N/A  . Number of Children: N/A  . Years of Education: N/A   Occupational History  . Not on file.   Social History Main Topics  . Smoking status: Current Every Day Smoker -- 0.50 packs/day for 20 years    Types: Cigarettes  . Smokeless tobacco: Never Used  . Alcohol Use: No  . Drug Use: No  . Sexual Activity: Yes    Birth Control/ Protection: None   Other Topics Concern  . Not on file   Social History Narrative    Review of Systems: Other than what is stated in HPI, all other systems are negative.   Objective:   Filed Vitals:   12/22/15 1129  BP: 165/102  Pulse: 92  Temp: 98.6 F (37 C)  Resp: 17    Physical Exam  Constitutional: She is oriented to person, place, and time.  Cardiovascular: Normal rate, regular rhythm and normal heart sounds.   Pulses:      Dorsalis pedis pulses are 2+ on the right side, and 2+ on the left side.       Posterior tibial pulses are 2+ on the right side, and 2+ on the left side.  Pulmonary/Chest: Effort normal and breath sounds normal. Right breast exhibits no mass. Left breast exhibits no mass.  Left breast abscess--non draining or tender to touch  Genitourinary: Uterus normal. No breast tenderness. Cervix exhibits motion tenderness, discharge and friability. Vaginal discharge found.  Feet:  Right Foot:  Protective Sensation: 10 sites tested.10 sites sensed. Skin Integrity: Negative for skin breakdown.  Left Foot:  Protective Sensation: 10 sites tested. 10 sites sensed. Skin Integrity: Negative for skin breakdown.  Lymphadenopathy:       Right: No inguinal adenopathy present.       Left: No inguinal adenopathy present.   Neurological: She is alert and oriented to person, place, and time.  Skin: Skin is warm and dry.  Psychiatric: She has a normal mood and affect.     Lab Results  Component Value Date   WBC 7.1 09/29/2014   HGB 13.0 09/29/2014   HCT 37.5 09/29/2014   MCV 88.9 09/29/2014   PLT 210 09/29/2014   Lab Results  Component Value Date   CREATININE 0.63 01/30/2015   BUN 9 01/30/2015   NA 133* 01/30/2015   K 4.9 01/30/2015   CL 99 01/30/2015   CO2  21 01/30/2015    Lab Results  Component Value Date   HGBA1C 12.50 12/22/2015   Lipid Panel  No results found for: CHOL, TRIG, HDL, CHOLHDL, VLDL, LDLCALC     Assessment and plan:   Preciosa was seen today for new patient (initial visit).  Diagnoses and all orders for this visit:  Type 2 diabetes mellitus without complication, without long-term current use of insulin (HCC) -     Glucose (CBG) -     HgB A1c -     insulin aspart (novoLOG) injection 10 Units; Inject 0.1 mLs (10 Units total) into the skin once. -     Microalbumin, urine -     insulin detemir (LEVEMIR) 100 UNIT/ML injection; Start with 10 units in morning and 20 units at night. -     Blood Glucose Monitoring Suppl (TRUE METRIX METER) w/Device KIT; Check sugars 3 times per day for E11.9 -     glucose blood (TRUE METRIX BLOOD GLUCOSE TEST) test strip; Check sugars 3 times per day for E11.9 -     TRUEPLUS LANCETS 28G MISC; Check sugars 3 times per day for E11.9 -     Saxagliptin-Metformin 04-999 MG TB24; Take 1 tablet by mouth daily. -     Glucose (CBG) -     COMPLETE METABOLIC PANEL WITH GFR -     Lipid panel -     Insulin Syringe-Needle U-100 (TRUEPLUS INSULIN SYRINGE) 30G X 5/16" 1 ML MISC; GIve insulin twice daily for E11.9 Patients A1C is elevated today due to not being on medication for several months. I have discontinued the Metformin and placed her on Saxagliptin-Metformin ER combination.  I started her insulin back low dose until I am able to determine how much the  oral will lower her sugars. I will bring her back in 2 weeks and make adjustments as needed.  Elevated blood pressure -     cloNIDine (CATAPRES) tablet 0.1 mg; Take 1 tablet (0.1 mg total) by mouth once in office  Essential hypertension -     lisinopril-hydrochlorothiazide (PRINZIDE,ZESTORETIC) 10-12.5 MG tablet; Take 1 tablet by mouth daily. Restarted her back on original medication. Will recheck pressure when she returns to clinic in 2 weeks. DASH diet and exercise advised.   Vaginal itching -     Urinalysis Dipstick -     Cytology - PAP [place] Will treat with diflucan when results return   Breast cancer screening -     MM Digital Screening; Future  Healthcare maintenance -     Flu Vaccine QUAD 36+ mos PF IM (Fluarix & Fluzone Quad PF)  Breast abscess -     Begin doxycycline (VIBRAMYCIN) 100 MG capsule; Take 1 capsule (100 mg total) by mouth 2 (two) times daily  Return in about 2 weeks (around 01/05/2016) for Nurse Visit-log review and 3 mo PCP .       Lance Bosch, [facility] [phone] 12/22/2015, 11:37 AM

## 2015-12-23 ENCOUNTER — Other Ambulatory Visit: Payer: Self-pay | Admitting: Internal Medicine

## 2015-12-23 ENCOUNTER — Telehealth: Payer: Self-pay

## 2015-12-23 LAB — CERVICOVAGINAL ANCILLARY ONLY
CHLAMYDIA, DNA PROBE: NEGATIVE
NEISSERIA GONORRHEA: NEGATIVE
Trichomonas: NEGATIVE
WET PREP (BD AFFIRM): POSITIVE — AB

## 2015-12-23 LAB — MICROALBUMIN, URINE: MICROALB UR: 27.9 mg/dL

## 2015-12-23 MED ORDER — METRONIDAZOLE 500 MG PO TABS: 500.0000 mg | ORAL_TABLET | Freq: Two times a day (BID) | ORAL | Status: DC

## 2015-12-23 MED ORDER — INSULIN SYRINGE-NEEDLE U-100 28G X 1/2" 0.3 ML MISC
Status: DC
Start: 2015-12-23 — End: 2016-10-21

## 2015-12-23 NOTE — Telephone Encounter (Signed)
-----   Message from Lance Bosch, NP sent at 12/23/2015 11:53 AM EST ----- Patient positive for Bacterial Vaginosis . Please explain this is not a STD, but a imbalance of the vaginal pH. Please send Flagyl 500 mg BID for 7 days. No refills, no alcohol while on this medication.  Liver enzymes are elevated---is she on any psych medications? If she is a drinker, I would definitely avoid it Cholesterol looks great She has some protein in her urine but this should resolve when we get her diabetes better controlled and she starts back on her blood pressure medication

## 2015-12-23 NOTE — Telephone Encounter (Signed)
Spoke with patient and she is aware of her lab results Flagyl and insulin syringe prescriptions sent to [facility]

## 2015-12-24 LAB — CYTOLOGY - PAP

## 2015-12-25 ENCOUNTER — Telehealth: Payer: Self-pay

## 2015-12-25 NOTE — Telephone Encounter (Signed)
-----   Message from Lance Bosch, NP sent at 12/25/2015  9:03 AM EST ----- No STD's. Still waiting on cytology

## 2015-12-25 NOTE — Telephone Encounter (Signed)
Spoke with patient this am and she is aware that her test results were Negative for any STD's

## 2015-12-31 ENCOUNTER — Telehealth: Payer: Self-pay

## 2015-12-31 NOTE — Telephone Encounter (Signed)
-----   Message from Lance Bosch, NP sent at 12/29/2015  7:20 PM EST ----- Normal cytology, repeat in 3 years

## 2015-12-31 NOTE — Telephone Encounter (Signed)
Spoke with patient this am and she is aware of her normal cytology  Results and to repeat her exam in three years

## 2016-01-06 ENCOUNTER — Encounter: Payer: Medicare Other | Admitting: Pharmacist

## 2016-01-08 ENCOUNTER — Ambulatory Visit: Payer: Medicare Other | Attending: Internal Medicine | Admitting: Pharmacist

## 2016-01-08 ENCOUNTER — Encounter: Payer: Self-pay | Admitting: Pharmacist

## 2016-01-08 VITALS — BP 151/100 | HR 94

## 2016-01-08 DIAGNOSIS — E119 Type 2 diabetes mellitus without complications: Secondary | ICD-10-CM | POA: Diagnosis not present

## 2016-01-08 MED ORDER — INSULIN DETEMIR 100 UNIT/ML FLEXPEN: PEN_INJECTOR | SUBCUTANEOUS | Status: DC

## 2016-01-08 MED ORDER — INSULIN PEN NEEDLE 32G X 4 MM MISC: Status: DC

## 2016-01-08 NOTE — Patient Instructions (Signed)
Increase your Levemir to 25 units every night. Continue the 20 units every day.  Come back and see me in 2 weeks with your blood sugar meter  Hypoglycemia Hypoglycemia occurs when the glucose in your blood is too low. Glucose is a type of sugar that is your body's main energy source. Hormones, such as insulin and glucagon, control the level of glucose in the blood. Insulin lowers blood glucose and glucagon increases blood glucose. Having too much insulin in your blood stream, or not eating enough food containing sugar, can result in hypoglycemia. Hypoglycemia can happen to people with or without diabetes. It can develop quickly and can be a medical emergency.  CAUSES   Missing or delaying meals.  Not eating enough carbohydrates at meals.  Taking too much diabetes medicine.  Not timing your oral diabetes medicine or insulin doses with meals, snacks, and exercise.  Nausea and vomiting.  Certain medicines.  Severe illnesses, such as hepatitis, kidney disorders, and certain eating disorders.  Increased activity or exercise without eating something extra or adjusting medicines.  Drinking too much alcohol.  A nerve disorder that affects body functions like your heart rate, blood pressure, and digestion (autonomic neuropathy).  A condition where the stomach muscles do not function properly (gastroparesis). Therefore, medicines and food may not absorb properly.  Rarely, a tumor of the pancreas can produce too much insulin. SYMPTOMS   Hunger.  Sweating (diaphoresis).  Change in body temperature.  Shakiness.  Headache.  Anxiety.  Lightheadedness.  Irritability.  Difficulty concentrating.  Dry mouth.  Tingling or numbness in the hands or feet.  Restless sleep or sleep disturbances.  Altered speech and coordination.  Change in mental status.  Seizures or prolonged convulsions.  Combativeness.  Drowsiness (lethargic).  Weakness.  Increased heart rate or  palpitations.  Confusion.  Pale, gray skin color.  Blurred or double vision.  Fainting. DIAGNOSIS  A physical exam and medical history will be performed. Your caregiver may make a diagnosis based on your symptoms. Blood tests and other lab tests may be performed to confirm a diagnosis. Once the diagnosis is made, your caregiver will see if your signs and symptoms go away once your blood glucose is raised.  TREATMENT  Usually, you can easily treat your hypoglycemia when you notice symptoms.  Check your blood glucose. If it is less than 70 mg/dl, take one of the following:   3-4 glucose tablets.    cup juice.    cup regular soda.   1 cup skim milk.   -1 tube of glucose gel.   5-6 hard candies.   Avoid high-fat drinks or food that may delay a rise in blood glucose levels.  Do not take more than the recommended amount of sugary foods, drinks, gel, or tablets. Doing so will cause your blood glucose to go too high.   Wait 10-15 minutes and recheck your blood glucose. If it is still less than 70 mg/dl or below your target range, repeat treatment.   Eat a snack if it is more than 1 hour until your next meal.  There may be a time when your blood glucose may go so low that you are unable to treat yourself at home when you start to notice symptoms. You may need someone to help you. You may even faint or be unable to swallow. If you cannot treat yourself, someone will need to bring you to the hospital.  [place]  If you have diabetes, follow your diabetes management  plan by:  Taking your medicines as directed.  Following your exercise plan.  Following your meal plan. Do not skip meals. Eat on time.  Testing your blood glucose regularly. Check your blood glucose before and after exercise. If you exercise longer or different than usual, be sure to check blood glucose more frequently.  Wearing your medical alert jewelry that says you have  diabetes.  Identify the cause of your hypoglycemia. Then, develop ways to prevent the recurrence of hypoglycemia.  Do not take a hot bath or shower right after an insulin shot.  Always carry treatment with you. Glucose tablets are the easiest to carry.  If you are going to drink alcohol, drink it only with meals.  Tell friends or family members ways to keep you safe during a seizure. This may include removing hard or sharp objects from the area or turning you on your side.  Maintain a healthy weight. SEEK MEDICAL CARE IF:   You are having problems keeping your blood glucose in your target range.  You are having frequent episodes of hypoglycemia.  You feel you might be having side effects from your medicines.  You are not sure why your blood glucose is dropping so low.  You notice a change in vision or a new problem with your vision. SEEK IMMEDIATE MEDICAL CARE IF:   Confusion develops.  A change in mental status occurs.  The inability to swallow develops.  Fainting occurs.   This information is not intended to replace advice given to you by your health care provider. Make sure you discuss any questions you have with your health care provider.   Document Released: 11/22/2005 Document Revised: 11/27/2013 Document Reviewed: 07/29/2015 Elsevier Interactive Patient Education [employer].

## 2016-01-08 NOTE — Progress Notes (Signed)
S:    Patient arrives in good spirits.  Presents for diabetes evaluation, education, and management at the request of Chari Manning, NP. Patient was referred on 12/22/15.  Patient was last seen by Primary Care Provider on 12/22/15.   Patient reports adherence with medications.  Current diabetes medications include: Levemir 20 units BID (she was supposed to be taking 10 units in the morning and 20 units at night but she increased to 20 units BID herself about 2 weeks ago). Patient is also on saxagliptin-metformin 04-999 mg 1 tablet daily.   Patient reports some GI upset with the saxagliptin-metformin but it is improving.   Current hypertension medications include: lisinopril-HCTZ 10-12.5 mg daily  Patient denies hypoglycemic events.  Patient reported dietary habits: Eats 3 meals/day. She will eat whatever is around and really will not try to watch her carb intake.  Patient reported exercise habits: none   Patient reports nocturia.  Patient reports neuropathy. Patient denies visual changes. Patient reports self foot exams.    O:  Lab Results  Component Value Date   HGBA1C 12.50 12/22/2015   Filed Vitals:   01/08/16 1054  BP: 151/100  Pulse: 94   7 day average = 236 Home fasting CBG: 167 - 358  2 hour post-prandial/random CBG: 181 - 349  10 year ASCVD risk: 14.5%  A/P: Diabetes currently uncontrolled based on A1c of 12.5. Patient denies hypoglycemic events and is able to verbalize appropriate hypoglycemia management plan. Patient reports adherence with medication. Control is suboptimal due to sedentary lifestyle and dietary indiscretion.  Continue Levemir 20 units every morning and increase to Levemir 25 units every night. Patient to return in 2 weeks for blood glucose meter review.   Next A1C anticipated April 2017.    ASCVD risk greater than 7.5%. Would hold off on aspirin right now due to age and consider statin therapy even though LDL is 90.  Hypertension currently  uncontrolled but patient speech is very pressured and she is just getting ready to start [place] so it may be due to anxiety.  Patient reports adherence with medication. Will continue to monitor and adjust medications as needed.  Written patient instructions provided.  Total time in face to face counseling 20 minutes.  Follow up in Pharmacist Clinic Visit in 2 weeks.

## 2016-01-09 ENCOUNTER — Encounter: Payer: Self-pay | Admitting: Family Medicine

## 2016-01-09 ENCOUNTER — Ambulatory Visit (INDEPENDENT_AMBULATORY_CARE_PROVIDER_SITE_OTHER): Payer: Medicare Other | Admitting: Family Medicine

## 2016-01-09 VITALS — BP 167/77 | HR 94 | Temp 98.2°F | Ht 64.0 in | Wt 197.1 lb

## 2016-01-09 DIAGNOSIS — B373 Candidiasis of vulva and vagina: Secondary | ICD-10-CM | POA: Diagnosis not present

## 2016-01-09 DIAGNOSIS — B3731 Acute candidiasis of vulva and vagina: Secondary | ICD-10-CM

## 2016-01-09 NOTE — Progress Notes (Signed)
   Subjective:    Patient ID: Sarah Long, female    DOB: [birth year], 45 y.o.   MRN: [id number]  HPI 45 year old poorly controlled diabetic with a last hemoglobin A1c of 12.5 approximately 3 weeks ago. Patient has recurrent vaginal yeast infections has been prescribed Diflucan as many times. Her symptoms include external vaginal itching. With Diflucan, this improves for a week and then returns. She has been trying to improve her blood sugars, which in the past has helped with her recurrent yeast infections. She does have a mild clumping white discharge   Review of Systems  Constitutional: Negative for fever, chills and fatigue (.).  Genitourinary: Positive for vaginal discharge and pelvic pain (occasional right). Negative for vaginal bleeding and vaginal pain.   Past medical, medications, allergies reviewed with the patient     Objective:   Physical Exam  Constitutional: She appears well-developed and well-nourished.  Cardiovascular: Normal rate, regular rhythm and normal heart sounds.   Pulmonary/Chest: Effort normal.  Abdominal: Soft. Bowel sounds are normal. She exhibits no distension and no mass. There is no tenderness. There is no rebound and no guarding.  Genitourinary: There is rash (mild erythema) on the right labia. There is no tenderness, lesion or injury on the right labia. There is rash (mild erythema) on the left labia. There is no tenderness, lesion or injury on the left labia. No erythema, tenderness or bleeding in the vagina. No foreign body around the vagina. No signs of injury around the vagina. Vaginal discharge (clumpy white) found.  Skin: Skin is warm and dry.  Psychiatric: She has a normal mood and affect. Her behavior is normal. Judgment and thought content normal.      Assessment & Plan:   1. Vulvar candidiasis Appears that much of her itching as external. Recommended clotrimazole twice a day over the next week to control vulvar candidiasis. Additionally, will  obtain wet prep to assure that she does not have any other infection. As controlling diabetes will be important in controlling her frequent yeast infections. - Wet prep, genital

## 2016-01-10 ENCOUNTER — Other Ambulatory Visit: Payer: Self-pay | Admitting: Family Medicine

## 2016-01-10 LAB — WET PREP, GENITAL
TRICH WET PREP: NONE SEEN
Yeast Wet Prep HPF POC: NONE SEEN

## 2016-01-10 MED ORDER — METRONIDAZOLE 500 MG PO TABS: 500.0000 mg | ORAL_TABLET | Freq: Two times a day (BID) | ORAL | Status: DC

## 2016-01-27 ENCOUNTER — Inpatient Hospital Stay: Admission: RE | Admit: 2016-01-27 | Payer: Medicare Other | Source: Ambulatory Visit

## 2016-02-17 ENCOUNTER — Other Ambulatory Visit: Payer: Self-pay | Admitting: Internal Medicine

## 2016-02-18 ENCOUNTER — Ambulatory Visit: Payer: Medicare Other

## 2016-02-26 ENCOUNTER — Ambulatory Visit: Payer: Medicare Other

## 2016-02-28 DIAGNOSIS — E109 Type 1 diabetes mellitus without complications: Secondary | ICD-10-CM | POA: Diagnosis not present

## 2016-03-13 ENCOUNTER — Ambulatory Visit (HOSPITAL_COMMUNITY)
Admission: EM | Admit: 2016-03-13 | Discharge: 2016-03-13 | Disposition: A | Discharge status: Home / Self Care | Payer: Medicare Other | Attending: Family Medicine | Admitting: Family Medicine

## 2016-03-13 ENCOUNTER — Encounter (HOSPITAL_COMMUNITY): Payer: Self-pay | Admitting: Emergency Medicine

## 2016-03-13 DIAGNOSIS — K0889 Other specified disorders of teeth and supporting structures: Secondary | ICD-10-CM | POA: Diagnosis not present

## 2016-03-13 DIAGNOSIS — K029 Dental caries, unspecified: Secondary | ICD-10-CM

## 2016-03-13 MED ORDER — HYDROCODONE-ACETAMINOPHEN 5-325 MG PO TABS: 1.0000 | ORAL_TABLET | ORAL | Status: DC | PRN

## 2016-03-13 MED ORDER — AMOXICILLIN 500 MG PO CAPS: 500.0000 mg | ORAL_CAPSULE | Freq: Three times a day (TID) | ORAL | Status: DC

## 2016-03-13 NOTE — Discharge Instructions (Signed)
Dental Pain Dental pain may be caused by many things, including:  Tooth decay (cavities or caries). Cavities expose the nerve of your tooth to air and hot or cold temperatures. This can cause pain or discomfort.  Abscess or infection. A dental abscess is a collection of infected pus from a bacterial infection in the inner part of the tooth (pulp). It usually occurs at the end of the tooth's root.  Injury.  An unknown reason (idiopathic). Your pain may be mild or severe. It may only occur when:  You are chewing.  You are exposed to hot or cold temperature.  You are eating or drinking sugary foods or beverages, such as soda or candy. Your pain may also be constant. HOME CARE INSTRUCTIONS Watch your dental pain for any changes. The following actions may help to lessen any discomfort that you are feeling:  Take medicines only as directed by your dentist.  If you were prescribed an antibiotic medicine, finish all of it even if you start to feel better.  Keep all follow-up visits as directed by your dentist. This is important.  Do not apply heat to the outside of your face.  Rinse your mouth or gargle with salt water if directed by your dentist. This helps with pain and swelling.  You can make salt water by adding  tsp of salt to 1 cup of warm water.  Apply ice to the painful area of your face:  Put ice in a plastic bag.  Place a towel between your skin and the bag.  Leave the ice on for 20 minutes, 2-3 times per day.  Avoid foods or drinks that cause you pain, such as:  Very hot or very cold foods or drinks.  Sweet or sugary foods or drinks. SEEK MEDICAL CARE IF:  Your pain is not controlled with medicines.  Your symptoms are worse.  You have new symptoms. SEEK IMMEDIATE MEDICAL CARE IF:  You are unable to open your mouth.  You are having trouble breathing or swallowing.  You have a fever.  Your face, neck, or jaw is swollen.   This information is not  intended to replace advice given to you by your health care provider. Make sure you discuss any questions you have with your health care provider.   Document Released: 11/22/2005 Document Revised: 04/08/2015 Document Reviewed: 11/18/2014 Elsevier Interactive Patient Education 2016 [place] and Dentist Visits Dental care supports good overall health. Regular dental visits can also help you avoid dental pain, bleeding, infection, and other more serious health problems in the future. It is important to keep the mouth healthy because diseases in the teeth, gums, and other oral tissues can spread to other areas of the body. Some problems, such as diabetes, heart disease, and pre-term labor have been associated with poor oral health.  See your dentist every 6 months. If you experience emergency problems such as a toothache or broken tooth, go to the dentist right away. If you see your dentist regularly, you may catch problems early. It is easier to be treated for problems in the early stages.  WHAT TO EXPECT AT A DENTIST VISIT  Your dentist will look for many common oral health problems and recommend proper treatment. At your regular dental visit, you can expect:  Gentle cleaning of the teeth and gums. This includes scraping and polishing. This helps to remove the sticky substance around the teeth and gums (plaque). Plaque forms in the mouth shortly after eating. Over  time, plaque hardens on the teeth as tartar. If tartar is not removed regularly, it can cause problems. Cleaning also helps remove stains.  Periodic X-rays. These pictures of the teeth and supporting bone will help your dentist assess the health of your teeth.  Periodic fluoride treatments. Fluoride is a natural mineral shown to help strengthen teeth. Fluoride treatmentinvolves applying a fluoride gel or varnish to the teeth. It is most commonly done in children.  Examination of the mouth, tongue, jaws, teeth, and gums to  look for any oral health problems, such as:  Cavities (dental caries). This is decay on the tooth caused by plaque, sugar, and acid in the mouth. It is best to catch a cavity when it is small.  Inflammation of the gums caused by plaque buildup (gingivitis).  Problems with the mouth or malformed or misaligned teeth.  Oral cancer or other diseases of the soft tissues or jaws. KEEP YOUR TEETH AND GUMS HEALTHY For healthy teeth and gums, follow these general guidelines as well as your dentist's specific advice:  Have your teeth professionally cleaned at the dentist every 6 months.  Brush twice daily with a fluoride toothpaste.  Floss your teeth daily.  Ask your dentist if you need fluoride supplements, treatments, or fluoride toothpaste.  Eat a healthy diet. Reduce foods and drinks with added sugar.  Avoid smoking. TREATMENT FOR ORAL HEALTH PROBLEMS If you have oral health problems, treatment varies depending on the conditions present in your teeth and gums.  Your caregiver will most likely recommend good oral hygiene at each visit.  For cavities, gingivitis, or other oral health disease, your caregiver will perform a procedure to treat the problem. This is typically done at a separate appointment. Sometimes your caregiver will refer you to another dental specialist for specific tooth problems or for surgery. SEEK IMMEDIATE DENTAL CARE IF:  You have pain, bleeding, or soreness in the gum, tooth, jaw, or mouth area.  A permanent tooth becomes loose or separated from the gum socket.  You experience a blow or injury to the mouth or jaw area.   This information is not intended to replace advice given to you by your health care provider. Make sure you discuss any questions you have with your health care provider.   Document Released: 08/04/2011 Document Revised: 02/14/2012 Document Reviewed: 08/04/2011 Elsevier Interactive Patient Education [employer].

## 2016-03-13 NOTE — ED Provider Notes (Signed)
CSN: [id number]     Arrival date & time 03/13/16  1406 History   First MD Initiated Contact with Patient 03/13/16 1524     Chief Complaint  Patient presents with  . Dental Pain   (Consider location/radiation/quality/duration/timing/severity/associated sxs/prior Treatment) HPI Comments: 45 year old female awoke with toothache this morning. She is tearful and saying that the pain is severe. She states it is the right upper tooth and adds that this tooth has given her problems before then it has been suggested that she have it extracted.   Past Medical History  Diagnosis Date  . Fibroid   . Preterm labor   . Herpes   . Bipolar 1 disorder ([place])   . Hypertension     on aldomet  . Diabetes mellitus     nph 20U qam and qpm, regular with meals  . Diabetes in pregnancy ([place])   . Headache(784.0)   . Depression     recently added wellbutrin-has not taken yet for bipolar  . H/O varicella   . Abnormal Pap smear 1998  . Trichomonas   . Yeast infection   . Bacterial infection   . Galactorrhea of right breast 2008  . Oligomenorrhea 07/2007  . Candida vaginitis 07/2007  . H/O amenorrhea 07/2007  . Hx: UTI (urinary tract infection) 2009  . Pelvic pain in female   . HSV-2 infection 01/03/2009  . H/O dysmenorrhea 2010  . Increased BMI 2010  . Heavy vaginal bleeding due to contraceptive injection use 10/12/2011    Depo Provera  . H/O menorrhagia 10/14/11  . Obesity 10/14/11  . H/O dizziness 10/14/11  . Abnormal vaginal bleeding 12/19/2011   Past Surgical History  Procedure Laterality Date  . Cesarean section  1991   Family History  Problem Relation Age of Onset  . Hypertension Mother   . Diabetes Mother   . Heart disease Mother   . Hypertension Father   . Diabetes Father   . Heart disease Father   . Other Neg Hx   . Stroke Maternal Grandfather    Social History  Substance Use Topics  . Smoking status: Current Every Day Smoker -- 0.50 packs/day for 20 years    Types: Cigarettes  .  Smokeless tobacco: Never Used  . Alcohol Use: No   OB History    Gravida Para Term Preterm AB TAB SAB Ectopic Multiple Living   '6 4 1 3 2 1 1 '$ 0 0 4     Review of Systems  Constitutional: Negative.  Negative for fever and chills.  HENT: Positive for dental problem. Negative for ear pain and sore throat.   Respiratory: Negative.   Gastrointestinal: Negative.   Neurological: Negative.   Hematological: Negative.     Allergies  Strawberry extract and Sulfa antibiotics  Home Medications   Prior to Admission medications   Medication Sig Start Date End Date Taking? Authorizing Provider  Insulin Detemir (LEVEMIR) 100 UNIT/ML Pen Inject 20 units every morning and 25 units every evening. 01/08/16  Yes Tresa Garter, MD  lisinopril-hydrochlorothiazide (PRINZIDE,ZESTORETIC) 10-12.5 MG tablet Take 1 tablet by mouth daily. 12/22/15  Yes Lance Bosch, NP  lurasidone (LATUDA) 40 MG TABS tablet Take 40 mg by mouth daily with breakfast.   Yes Historical Provider, MD  amoxicillin (AMOXIL) 500 MG capsule Take 1 capsule (500 mg total) by mouth 3 (three) times daily. 03/13/16   Janne Napoleon, NP  Blood Glucose Monitoring Suppl (TRUE METRIX METER) w/Device KIT Check sugars 3 times per day for  E11.9 12/22/15   Lance Bosch, NP  glucose blood (TRUE METRIX BLOOD GLUCOSE TEST) test strip Check sugars 3 times per day for E11.9 12/22/15   Lance Bosch, NP  HYDROcodone-acetaminophen (NORCO/VICODIN) 5-325 MG tablet Take 1 tablet by mouth every 4 (four) hours as needed. 03/13/16   Janne Napoleon, NP  Insulin Pen Needle (BD PEN NEEDLE NANO U/F) 32G X 4 MM MISC Use as directed for twice daily insulin dosing 01/08/16   Tresa Garter, MD  Insulin Syringe-Needle U-100 (TRUEPLUS INSULIN SYRINGE) 30G X 5/16" 1 ML MISC GIve insulin twice daily for E11.9 12/22/15   Lance Bosch, NP  Insulin Syringe-Needle U-100 28G X 1/2" 0.3 ML MISC Check blood sugar TID & QHS 12/23/15   Lance Bosch, NP  metroNIDAZOLE (FLAGYL) 500 MG  tablet Take 1 tablet (500 mg total) by mouth 2 (two) times daily. 01/10/16   Truett Mainland, DO  naproxen sodium (ALEVE) 220 MG tablet Take 440 mg by mouth 2 (two) times daily as needed (pain).    Historical Provider, MD  Saxagliptin-Metformin 04-999 MG TB24 Take 1 tablet by mouth daily. 12/22/15   Lance Bosch, NP  TRUEPLUS LANCETS 28G MISC Check sugars 3 times per day for E11.9 12/22/15   Lance Bosch, NP  valACYclovir (VALTREX) 500 MG tablet Take 1 tablet (500 mg total) by mouth 2 (two) times daily. 07/16/15   Tresea Mall, CNM   Meds Ordered and Administered this Visit  Medications - No data to display  BP 125/64 mmHg  Pulse 118  Temp(Src) 98.4 F (36.9 C) (Oral)  Resp 16  SpO2 98%  LMP 02/08/2016 No data found.   Physical Exam  Constitutional: She is oriented to person, place, and time. She appears well-developed and well-nourished. No distress.  HENT:  Mouth/Throat: Oropharynx is clear and moist.  Fair to poor dental repair. Right upper tooth #5 with severe loss of enamel and pulp with severe decay. There is local tenderness as well as adjacent tooth tenderness. No gingival swelling or facial swelling. No evidence of abscess formation.  Eyes: EOM are normal.  Neck: Normal range of motion. Neck supple.  Cardiovascular: Normal rate.   Pulmonary/Chest: Effort normal. No respiratory distress.  Musculoskeletal: She exhibits no edema.  Lymphadenopathy:    She has no cervical adenopathy.  Neurological: She is alert and oriented to person, place, and time. She exhibits normal muscle tone.  Skin: Skin is warm and dry.  Psychiatric: She has a normal mood and affect.  Nursing note and vitals reviewed.   ED Course  Procedures (including critical care time)  Labs Review Labs Reviewed - No data to display  Imaging Review No results found.   Visual Acuity Review  Right Eye Distance:   Left Eye Distance:   Bilateral Distance:    Right Eye Near:   Left Eye Near:     Bilateral Near:         MDM   1. Toothache   2. Tooth decayed    Meds ordered this encounter  Medications  . amoxicillin (AMOXIL) 500 MG capsule    Sig: Take 1 capsule (500 mg total) by mouth 3 (three) times daily.    Dispense:  21 capsule    Refill:  0    Order Specific Question:  Supervising Provider    Answer:  Billy Fischer [phone]  . HYDROcodone-acetaminophen (NORCO/VICODIN) 5-325 MG tablet    Sig: Take 1 tablet by mouth every 4 (  four) hours as needed.    Dispense:  15 tablet    Refill:  0    Order Specific Question:  Supervising Provider    Answer:  Billy Fischer [[id number]]   Call your dentist Monday    Janne Napoleon, NP 03/13/16 [phone]

## 2016-03-13 NOTE — ED Notes (Signed)
C/o right upper side dental pain onset this am when she woke up... Pt is crying and rocking back and forth... Taking OTC pain meds w/no relief.... Has dentist... A&O x4... No acute distress.

## 2016-03-16 ENCOUNTER — Ambulatory Visit
Admission: RE | Admit: 2016-03-16 | Discharge: 2016-03-16 | Disposition: A | Discharge status: Home / Self Care | Payer: Medicare Other | Source: Ambulatory Visit | Attending: Internal Medicine | Admitting: Internal Medicine

## 2016-03-16 DIAGNOSIS — Z1239 Encounter for other screening for malignant neoplasm of breast: Secondary | ICD-10-CM

## 2016-03-16 DIAGNOSIS — Z1231 Encounter for screening mammogram for malignant neoplasm of breast: Secondary | ICD-10-CM | POA: Diagnosis not present

## 2016-04-23 DIAGNOSIS — H2513 Age-related nuclear cataract, bilateral: Secondary | ICD-10-CM | POA: Diagnosis not present

## 2016-04-23 DIAGNOSIS — E119 Type 2 diabetes mellitus without complications: Secondary | ICD-10-CM | POA: Diagnosis not present

## 2016-04-23 DIAGNOSIS — H43393 Other vitreous opacities, bilateral: Secondary | ICD-10-CM | POA: Diagnosis not present

## 2016-05-20 ENCOUNTER — Encounter (HOSPITAL_COMMUNITY): Payer: Self-pay

## 2016-05-20 ENCOUNTER — Emergency Department (HOSPITAL_COMMUNITY)
Admission: EM | Admit: 2016-05-20 | Discharge: 2016-05-20 | Disposition: A | Discharge status: Home / Self Care | Payer: Medicare Other | Attending: Emergency Medicine | Admitting: Emergency Medicine

## 2016-05-20 ENCOUNTER — Emergency Department (HOSPITAL_COMMUNITY): Payer: Medicare Other

## 2016-05-20 DIAGNOSIS — Z79891 Long term (current) use of opiate analgesic: Secondary | ICD-10-CM | POA: Insufficient documentation

## 2016-05-20 DIAGNOSIS — Z792 Long term (current) use of antibiotics: Secondary | ICD-10-CM | POA: Insufficient documentation

## 2016-05-20 DIAGNOSIS — R112 Nausea with vomiting, unspecified: Secondary | ICD-10-CM | POA: Insufficient documentation

## 2016-05-20 DIAGNOSIS — E1165 Type 2 diabetes mellitus with hyperglycemia: Secondary | ICD-10-CM | POA: Diagnosis not present

## 2016-05-20 DIAGNOSIS — Z79899 Other long term (current) drug therapy: Secondary | ICD-10-CM | POA: Diagnosis not present

## 2016-05-20 DIAGNOSIS — R1110 Vomiting, unspecified: Secondary | ICD-10-CM | POA: Diagnosis not present

## 2016-05-20 DIAGNOSIS — Z791 Long term (current) use of non-steroidal anti-inflammatories (NSAID): Secondary | ICD-10-CM | POA: Diagnosis not present

## 2016-05-20 DIAGNOSIS — M79671 Pain in right foot: Secondary | ICD-10-CM | POA: Insufficient documentation

## 2016-05-20 DIAGNOSIS — F17210 Nicotine dependence, cigarettes, uncomplicated: Secondary | ICD-10-CM | POA: Insufficient documentation

## 2016-05-20 DIAGNOSIS — E1065 Type 1 diabetes mellitus with hyperglycemia: Secondary | ICD-10-CM | POA: Insufficient documentation

## 2016-05-20 DIAGNOSIS — I10 Essential (primary) hypertension: Secondary | ICD-10-CM | POA: Diagnosis not present

## 2016-05-20 LAB — COMPREHENSIVE METABOLIC PANEL
ALBUMIN: 3.3 g/dL — AB (ref 3.5–5.0)
ALT: 55 U/L — AB (ref 14–54)
AST: 45 U/L — AB (ref 15–41)
Alkaline Phosphatase: 67 U/L (ref 38–126)
Anion gap: 10 (ref 5–15)
BUN: 10 mg/dL (ref 6–20)
CHLORIDE: 100 mmol/L — AB (ref 101–111)
CO2: 21 mmol/L — AB (ref 22–32)
CREATININE: 0.73 mg/dL (ref 0.44–1.00)
Calcium: 8.7 mg/dL — ABNORMAL LOW (ref 8.9–10.3)
GFR calc non Af Amer: >60 mL/min (ref >60)
GLUCOSE: 315 mg/dL — AB (ref 65–99)
Potassium: 3.9 mmol/L (ref 3.5–5.1)
SODIUM: 131 mmol/L — AB (ref 135–145)
Total Bilirubin: 0.3 mg/dL (ref 0.3–1.2)
Total Protein: 6.5 g/dL (ref 6.5–8.1)

## 2016-05-20 LAB — CBC
HCT: 36.8 % (ref 36.0–46.0)
Hemoglobin: 12.4 g/dL (ref 12.0–15.0)
MCH: 29.8 pg (ref 26.0–34.0)
MCHC: 33.7 g/dL (ref 30.0–36.0)
MCV: 88.5 fL (ref 78.0–100.0)
PLATELETS: 231 10*3/uL (ref 150–400)
RBC: 4.16 MIL/uL (ref 3.87–5.11)
RDW: 12.8 % (ref 11.5–15.5)
WBC: 6.2 10*3/uL (ref 4.0–10.5)

## 2016-05-20 LAB — CBG MONITORING, ED: GLUCOSE-CAPILLARY: 314 mg/dL — AB (ref 65–99)

## 2016-05-20 LAB — I-STAT TROPONIN, ED: Troponin i, poc: 0.04 ng/mL (ref 0.00–0.08)

## 2016-05-20 MED ORDER — GABAPENTIN 300 MG PO CAPS: 300.0000 mg | ORAL_CAPSULE | Freq: Two times a day (BID) | ORAL | Status: DC | PRN

## 2016-05-20 MED ORDER — GABAPENTIN 300 MG PO CAPS
300.0000 mg | ORAL_CAPSULE | Freq: Once | ORAL | Status: AC
  Administered 2016-05-20: 300 mg via ORAL
  Filled 2016-05-20: qty 1

## 2016-05-20 MED ORDER — SODIUM CHLORIDE 0.9 % IV BOLUS (SEPSIS)
1000.0000 mL | Freq: Once | INTRAVENOUS | Status: AC
  Administered 2016-05-20: 1000 mL via INTRAVENOUS

## 2016-05-20 NOTE — ED Provider Notes (Signed)
CSN: [id number]     Arrival date & time 05/20/16  0735 History   First MD Initiated Contact with Patient 05/20/16 [phone]     Chief Complaint  Patient presents with  . Foot Pain  . Nausea     (Consider location/radiation/quality/duration/timing/severity/associated sxs/prior Treatment) HPI Comments: 45 year old female with history of insulin-dependent diabetes presents for foot pain. The patient states that she was up since 1:30 last night because she was having a cramping sensation in the top of her right foot. She says that she often gets cramping in her legs overnight but that usually she can walk around and get back to sleep. She said she has never had such a sustained episode of cramping in her foot before. She reports that it also has sharp shooting pains as well. She says that her blood sugar has been out of control recently as they are trying to adjust her insulin regimen. She did not injure the foot in any way. She was not wearing high-heeled shoes. She also notes that over last night she had 2 episodes of vomiting and has felt mildly nauseous. She denies chest pain or shortness of breath. No abdominal pain. Reports normal bowel and bladder function. No constipation. No diarrhea. No fevers or chills.  Patient is a 45 y.o. female presenting with lower extremity pain.  Foot Pain Pertinent negatives include no chest pain and no shortness of breath.    Past Medical History  Diagnosis Date  . Fibroid   . Preterm labor   . Herpes   . Bipolar 1 disorder ([place])   . Hypertension     on aldomet  . Diabetes mellitus     nph 20U qam and qpm, regular with meals  . Diabetes in pregnancy ([place])   . Headache(784.0)   . Depression     recently added wellbutrin-has not taken yet for bipolar  . H/O varicella   . Abnormal Pap smear 1998  . Trichomonas   . Yeast infection   . Bacterial infection   . Galactorrhea of right breast 2008  . Oligomenorrhea 07/2007  . Candida vaginitis 07/2007  . H/O  amenorrhea 07/2007  . Hx: UTI (urinary tract infection) 2009  . Pelvic pain in female   . HSV-2 infection 01/03/2009  . H/O dysmenorrhea 2010  . Increased BMI 2010  . Heavy vaginal bleeding due to contraceptive injection use 10/12/2011    Depo Provera  . H/O menorrhagia 10/14/11  . Obesity 10/14/11  . H/O dizziness 10/14/11  . Abnormal vaginal bleeding 12/19/2011   Past Surgical History  Procedure Laterality Date  . Cesarean section  1991   Family History  Problem Relation Age of Onset  . Hypertension Mother   . Diabetes Mother   . Heart disease Mother   . Hypertension Father   . Diabetes Father   . Heart disease Father   . Other Neg Hx   . Stroke Maternal Grandfather    Social History  Substance Use Topics  . Smoking status: Current Every Day Smoker -- 0.50 packs/day for 20 years    Types: Cigarettes  . Smokeless tobacco: Never Used  . Alcohol Use: No   OB History    Gravida Para Term Preterm AB TAB SAB Ectopic Multiple Living   _0 0 0 4     Review of Systems  Constitutional: Negative for fever, chills, diaphoresis and fatigue.  HENT: Negative for congestion, postnasal drip and rhinorrhea.   Eyes: Negative  for visual disturbance.  Respiratory: Negative for cough, chest tightness, shortness of breath and wheezing.   Cardiovascular: Negative for chest pain, palpitations and leg swelling.  Gastrointestinal: Positive for nausea and vomiting (x2). Negative for diarrhea and anal bleeding.  Genitourinary: Negative for dysuria, urgency and hematuria.  Musculoskeletal: Positive for arthralgias (right foot pain). Negative for myalgias and back pain.  Skin: Negative for rash and wound.  Neurological: Negative for dizziness, seizures, weakness and numbness.  Hematological: Does not bruise/bleed easily.      Allergies  Strawberry extract and Sulfa antibiotics  Home Medications   Prior to Admission medications   Medication Sig Start Date End Date Taking?  Authorizing Provider  Blood Glucose Monitoring Suppl (TRUE METRIX METER) w/Device KIT Check sugars 3 times per day for E11.9 12/22/15  Yes Lance Bosch, NP  diphenhydrAMINE (SOMINEX) 25 MG tablet Take 25 mg by mouth at bedtime as needed for itching, allergies or sleep.   Yes Historical Provider, MD  glucose blood (TRUE METRIX BLOOD GLUCOSE TEST) test strip Check sugars 3 times per day for E11.9 12/22/15  Yes Lance Bosch, NP  ibuprofen (ADVIL,MOTRIN) 200 MG tablet Take 200 mg by mouth every 6 (six) hours as needed for moderate pain.   Yes Historical Provider, MD  Insulin Detemir (LEVEMIR) 100 UNIT/ML Pen Inject 20 units every morning and 25 units every evening. 01/08/16  Yes Tresa Garter, MD  Insulin Pen Needle (BD PEN NEEDLE NANO U/F) 32G X 4 MM MISC Use as directed for twice daily insulin dosing 01/08/16  Yes Tresa Garter, MD  Insulin Syringe-Needle U-100 (TRUEPLUS INSULIN SYRINGE) 30G X 5/16" 1 ML MISC GIve insulin twice daily for E11.9 12/22/15  Yes Lance Bosch, NP  Insulin Syringe-Needle U-100 28G X 1/2" 0.3 ML MISC Check blood sugar TID & QHS 12/23/15  Yes Lance Bosch, NP  lisinopril-hydrochlorothiazide (PRINZIDE,ZESTORETIC) 10-12.5 MG tablet Take 1 tablet by mouth daily. 12/22/15  Yes Lance Bosch, NP  Saxagliptin-Metformin 04-999 MG TB24 Take 1 tablet by mouth daily. 12/22/15  Yes Lance Bosch, NP  TRUEPLUS LANCETS 28G MISC Check sugars 3 times per day for E11.9 12/22/15  Yes Lance Bosch, NP  amoxicillin (AMOXIL) 500 MG capsule Take 1 capsule (500 mg total) by mouth 3 (three) times daily. Patient not taking: Reported on 05/20/2016 03/13/16   Janne Napoleon, NP  gabapentin (NEURONTIN) 300 MG capsule Take 1 capsule (300 mg total) by mouth 2 (two) times daily as needed. 05/20/16   Harvel Quale, MD  HYDROcodone-acetaminophen (NORCO/VICODIN) 5-325 MG tablet Take 1 tablet by mouth every 4 (four) hours as needed. Patient not taking: Reported on 05/20/2016 03/13/16   Janne Napoleon, NP   metroNIDAZOLE (FLAGYL) 500 MG tablet Take 1 tablet (500 mg total) by mouth 2 (two) times daily. Patient not taking: Reported on 05/20/2016 01/10/16   Tanna Savoy Stinson, DO  valACYclovir (VALTREX) 500 MG tablet Take 1 tablet (500 mg total) by mouth 2 (two) times daily. Patient not taking: Reported on 05/20/2016 07/16/15   Tresea Mall, CNM   BP 178/98 mmHg  Pulse 87  Temp(Src) 97.7 F (36.5 C) (Oral)  Resp 16  SpO2 98%  LMP 05/17/2016 (Within Days) Physical Exam  Constitutional: She is oriented to person, place, and time. She appears well-developed and well-nourished. No distress.  HENT:  Head: Normocephalic and atraumatic.  Right Ear: External ear normal.  Left Ear: External ear normal.  Nose: Nose normal.  Mouth/Throat: Oropharynx is clear  and moist. No oropharyngeal exudate.  Eyes: EOM are normal. Pupils are equal, round, and reactive to light.  Neck: Normal range of motion. Neck supple.  Cardiovascular: Normal rate, regular rhythm, normal heart sounds and intact distal pulses.   No murmur heard. Pulses:      Dorsalis pedis pulses are 2+ on the right side, and 2+ on the left side.  Pulmonary/Chest: Effort normal. No respiratory distress. She has no wheezes. She has no rales.  Abdominal: Soft. She exhibits no distension. There is no tenderness.  Musculoskeletal: Normal range of motion. She exhibits no edema or tenderness.       Right foot: Normal. There is normal range of motion, no tenderness, no bony tenderness, no swelling, normal capillary refill, no crepitus and no laceration.  Neurological: She is alert and oriented to person, place, and time.  Skin: Skin is warm and dry. No rash noted. She is not diaphoretic.  Vitals reviewed.   ED Course  Procedures (including critical care time) Labs Review Labs Reviewed  COMPREHENSIVE METABOLIC PANEL - Abnormal; Notable for the following:    Sodium 131 (*)    Chloride 100 (*)    CO2 21 (*)    Glucose, Bld 315 (*)    Calcium 8.7  (*)    Albumin 3.3 (*)    AST 45 (*)    ALT 55 (*)    All other components within normal limits  CBG MONITORING, ED - Abnormal; Notable for the following:    Glucose-Capillary 314 (*)    All other components within normal limits  CBC  I-STAT TROPOININ, ED    Imaging Review Dg Abd Acute W/chest  05/20/2016  CLINICAL DATA:  Vomiting today, history diabetes mellitus, hypertension, smoking EXAM: DG ABDOMEN ACUTE W/ 1V CHEST COMPARISON:  07/11/2012 abdominal radiograph FINDINGS: Upper normal heart size. Mediastinal contours and pulmonary vascularity normal. Lungs clear. No pleural effusion or pneumothorax. Nonobstructive bowel gas pattern. No bowel dilatation, bowel wall thickening, or free intraperitoneal air. Bones unremarkable. IMPRESSION: No acute abnormalities. Electronically Signed   By: Lavonia Dana M.D.   On: 05/20/2016 08:36   Dg Foot Complete Right  05/20/2016  CLINICAL DATA:  Pain at top of RIGHT foot for a few days, no injury, diabetes mellitus, hypertension, smoker, initial encounter EXAM: RIGHT FOOT COMPLETE - 3+ VIEW COMPARISON:  Non FINDINGS: Osseous mineralization normal. Joint spaces preserved. Small plantar calcaneal spur. No acute fracture, dislocation, or bone destruction. Mild scattered small vessel vascular calcification likely related to history of diabetes mellitus. IMPRESSION: No acute osseous abnormalities. Electronically Signed   By: Lavonia Dana M.D.   On: 05/20/2016 08:34   I have personally reviewed and evaluated these images and lab results as part of my medical decision-making.   EKG Interpretation   Date/Time:  Thursday May 20 2016 08:12:51 EDT Ventricular Rate:  89 PR Interval:  177 QRS Duration: 123 QT Interval:  414 QTC Calculation: 504 R Axis:   -68 Text Interpretation:  Sinus rhythm Nonspecific IVCD with LAD Left  ventricular hypertrophy Lateral infarct, recent ST segment elevations in  V2-V5 likely early repolarization similar to prior but more  pronounced in  V2 Confirmed by Nailah Luepke ([id number]) on 05/20/2016 8:19:13 AM Also  confirmed by Raffael Bugarin ([id number]), editor Gilford Rile, CCT, [place] ([id number])  on  05/20/2016 10:09:26 AM      MDM  Patient was seen and evaluated in stable condition. Patient neurovascularly intact. Benign examination. X-rays negative for acute process. Abdomen soft and nontender. EKG  with findings of repolarization. Troponin normal. Patient without chest pain or shortness of breath but 2 episodes of vomiting overnight. Acute abdominal series unremarkable. Laboratory studies with mildly decreased bicarbonate but normal anion gap. Patient was given a fluid bolus for her hyperglycemia and borderline low bicarbonate. She reported feeling significantly better after. She was also treated with gabapentin for her foot pain with improvement. She was discharged home in stable condition with instruction of follow-up with her primary care physician regarding her hyperglycemia as well as to keep herself well-hydrated and with a prescription for gabapentin. Final diagnoses:  Vomiting  Right foot pain    1. Right foot pain 2. Vomiting 3. Hyperglycemia    Harvel Quale, MD 05/20/16 1038

## 2016-05-20 NOTE — Discharge Instructions (Signed)
You were seen and evaluated today for your foot pain. This may be related to her high blood sugar in either mild neuropathy or also spasming in cramps in your foot. Likely it's her blood sugar gets high or you're going to have more cramps in your legs and feet. Please keep yourself well-hydrated and tried to follow up with your primary care physician to get better control of your blood glucose. It is unclear exactly why he had the 2 episodes of vomiting. If you develop abdominal pain, chest pain, shortness of breath please return immediately to the emergency department.  Muscle Cramps and Spasms Muscle cramps and spasms occur when a muscle or muscles tighten and you have no control over this tightening (involuntary muscle contraction). They are a common problem and can develop in any muscle. The most common place is in the calf muscles of the leg. Both muscle cramps and muscle spasms are involuntary muscle contractions, but they also have differences:   Muscle cramps are sporadic and painful. They may last a few seconds to a quarter of an hour. Muscle cramps are often more forceful and last longer than muscle spasms.  Muscle spasms may or may not be painful. They may also last just a few seconds or much longer. CAUSES  It is uncommon for cramps or spasms to be due to a serious underlying problem. In many cases, the cause of cramps or spasms is unknown. Some common causes are:   Overexertion.   Overuse from repetitive motions (doing the same thing over and over).   Remaining in a certain position for a long period of time.   Improper preparation, form, or technique while performing a sport or activity.   Dehydration.   Injury.   Side effects of some medicines.   Abnormally low levels of the salts and ions in your blood (electrolytes), especially potassium and calcium. This could happen if you are taking water pills (diuretics) or you are pregnant.  Some underlying medical problems  can make it more likely to develop cramps or spasms. These include, but are not limited to:   Diabetes.   Parkinson disease.   Hormone disorders, such as thyroid problems.   Alcohol abuse.   Diseases specific to muscles, joints, and bones.   Blood vessel disease where not enough blood is getting to the muscles.  HOME CARE INSTRUCTIONS   Stay well hydrated. Drink enough water and fluids to keep your urine clear or pale yellow.  It may be helpful to massage, stretch, and relax the affected muscle.  For tight or tense muscles, use a warm towel, heating pad, or hot shower water directed to the affected area.  If you are sore or have pain after a cramp or spasm, applying ice to the affected area may relieve discomfort.  Put ice in a plastic bag.  Place a towel between your skin and the bag.  Leave the ice on for 15-20 minutes, 03-04 times a day.  Medicines used to treat a known cause of cramps or spasms may help reduce their frequency or severity. Only take over-the-counter or prescription medicines as directed by your caregiver. SEEK MEDICAL CARE IF:  Your cramps or spasms get more severe, more frequent, or do not improve over time.  MAKE SURE YOU:   Understand these instructions.  Will watch your condition.  Will get help right away if you are not doing well or get worse.   This information is not intended to replace advice given to  you by your health care provider. Make sure you discuss any questions you have with your health care provider.   Document Released: 05/14/2002 Document Revised: 03/19/2013 Document Reviewed: 11/08/2012 Elsevier Interactive Patient Education 2016 [employer]  Nausea and Vomiting Nausea is a sick feeling that often comes before throwing up (vomiting). Vomiting is a reflex where stomach contents come out of your mouth. Vomiting can cause severe loss of body fluids (dehydration). Children and elderly adults can become dehydrated quickly,  especially if they also have diarrhea. Nausea and vomiting are symptoms of a condition or disease. It is important to find the cause of your symptoms. CAUSES   Direct irritation of the stomach lining. This irritation can result from increased acid production (gastroesophageal reflux disease), infection, food poisoning, taking certain medicines (such as nonsteroidal anti-inflammatory drugs), alcohol use, or tobacco use.  Signals from the brain.These signals could be caused by a headache, heat exposure, an inner ear disturbance, increased pressure in the brain from injury, infection, a tumor, or a concussion, pain, emotional stimulus, or metabolic problems.  An obstruction in the gastrointestinal tract (bowel obstruction).  Illnesses such as diabetes, hepatitis, gallbladder problems, appendicitis, kidney problems, cancer, sepsis, atypical symptoms of a heart attack, or eating disorders.  Medical treatments such as chemotherapy and radiation.  Receiving medicine that makes you sleep (general anesthetic) during surgery. DIAGNOSIS Your caregiver may ask for tests to be done if the problems do not improve after a few days. Tests may also be done if symptoms are severe or if the reason for the nausea and vomiting is not clear. Tests may include:  Urine tests.  Blood tests.  Stool tests.  Cultures (to look for evidence of infection).  X-rays or other imaging studies. Test results can help your caregiver make decisions about treatment or the need for additional tests. TREATMENT You need to stay well hydrated. Drink frequently but in small amounts.You may wish to drink water, sports drinks, clear broth, or eat frozen ice pops or gelatin dessert to help stay hydrated.When you eat, eating slowly may help prevent nausea.There are also some antinausea medicines that may help prevent nausea. HOME CARE INSTRUCTIONS   Take all medicine as directed by your caregiver.  If you do not have an  appetite, do not force yourself to eat. However, you must continue to drink fluids.  If you have an appetite, eat a normal diet unless your caregiver tells you differently.  Eat a variety of complex carbohydrates (rice, wheat, potatoes, bread), lean meats, yogurt, fruits, and vegetables.  Avoid high-fat foods because they are more difficult to digest.  Drink enough water and fluids to keep your urine clear or pale yellow.  If you are dehydrated, ask your caregiver for specific rehydration instructions. Signs of dehydration may include:  Severe thirst.  Dry lips and mouth.  Dizziness.  Dark urine.  Decreasing urine frequency and amount.  Confusion.  Rapid breathing or pulse. SEEK IMMEDIATE MEDICAL CARE IF:   You have blood or brown flecks (like coffee grounds) in your vomit.  You have black or bloody stools.  You have a severe headache or stiff neck.  You are confused.  You have severe abdominal pain.  You have chest pain or trouble breathing.  You do not urinate at least once every 8 hours.  You develop cold or clammy skin.  You continue to vomit for longer than 24 to 48 hours.  You have a fever. MAKE SURE YOU:   Understand these instructions.  Will watch your condition.  Will get help right away if you are not doing well or get worse.   This information is not intended to replace advice given to you by your health care provider. Make sure you discuss any questions you have with your health care provider.   Document Released: 11/22/2005 Document Revised: 02/14/2012 Document Reviewed: 04/21/2011 Elsevier Interactive Patient Education [employer].

## 2016-05-20 NOTE — ED Notes (Signed)
Pt here with c/o intermittent right foot pain. She reports she is a type 2 diabetic, denies injury or wound to the foot. Pt also reports nausea.

## 2016-05-21 DIAGNOSIS — H40013 Open angle with borderline findings, low risk, bilateral: Secondary | ICD-10-CM | POA: Diagnosis not present

## 2016-05-21 DIAGNOSIS — H43393 Other vitreous opacities, bilateral: Secondary | ICD-10-CM | POA: Diagnosis not present

## 2016-05-21 DIAGNOSIS — H2513 Age-related nuclear cataract, bilateral: Secondary | ICD-10-CM | POA: Diagnosis not present

## 2016-05-21 DIAGNOSIS — E119 Type 2 diabetes mellitus without complications: Secondary | ICD-10-CM | POA: Diagnosis not present

## 2016-05-29 DIAGNOSIS — E109 Type 1 diabetes mellitus without complications: Secondary | ICD-10-CM | POA: Diagnosis not present

## 2016-08-28 DIAGNOSIS — E109 Type 1 diabetes mellitus without complications: Secondary | ICD-10-CM | POA: Diagnosis not present

## 2016-10-13 ENCOUNTER — Encounter (HOSPITAL_COMMUNITY): Payer: Self-pay | Admitting: Family Medicine

## 2016-10-13 ENCOUNTER — Ambulatory Visit (HOSPITAL_COMMUNITY)
Admission: EM | Admit: 2016-10-13 | Discharge: 2016-10-13 | Disposition: A | Discharge status: Home / Self Care | Payer: Medicare Other | Attending: Family Medicine | Admitting: Family Medicine

## 2016-10-13 DIAGNOSIS — L03012 Cellulitis of left finger: Secondary | ICD-10-CM

## 2016-10-13 DIAGNOSIS — L03011 Cellulitis of right finger: Secondary | ICD-10-CM

## 2016-10-13 MED ORDER — HYDROCODONE-ACETAMINOPHEN 5-325 MG PO TABS
1.0000 | ORAL_TABLET | Freq: Four times a day (QID) | ORAL | 0 refills | Status: DC | PRN
Start: 2016-10-13 — End: 2016-10-21

## 2016-10-13 MED ORDER — DOXYCYCLINE HYCLATE 100 MG PO TABS: 100.0000 mg | ORAL_TABLET | Freq: Two times a day (BID) | ORAL | 0 refills | Status: DC

## 2016-10-13 NOTE — ED Provider Notes (Signed)
[place]    CSN: [id number] Arrival date & time: 10/13/16  1247     History   Chief Complaint Chief Complaint  Patient presents with  . Hand Problem    HPI Sarah Long is a 45 y.o. female.   This 45 year old woman who comes in with swelling and pain in her left hand. Initially, pain was present in the PIP joint of the left ring finger. Separately she developed the typical signs of a paronychia in the left index finger. The pain is been throbbing and getting worse. The pain after he started 2 weeks ago in the left ring finger. Ibuprofen has not helped at all. 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      Past Medical History:  Diagnosis Date  . Abnormal Pap smear 1998  . Abnormal vaginal bleeding 12/19/2011  . Bacterial infection   . Bipolar 1 disorder ([place])   . Candida vaginitis 07/2007  . Depression    recently added wellbutrin-has not taken yet for bipolar  . Diabetes in pregnancy   . Diabetes mellitus    nph 20U qam and qpm, regular with meals  . Fibroid   . Galactorrhea of right breast 2008  . H/O amenorrhea 07/2007  . H/O dizziness 10/14/11  . H/O dysmenorrhea 2010  . H/O menorrhagia 10/14/11  . H/O varicella   . Headache(784.0)   . Heavy vaginal bleeding due to contraceptive injection use 10/12/2011   Depo Provera  . Herpes   . HSV-2 infection 01/03/2009  . Hx: UTI (urinary tract infection) 2009  . Hypertension    on aldomet  . Increased BMI 2010  . Obesity 10/14/11  . Oligomenorrhea 07/2007  . Pelvic pain in female   . Preterm labor   . Trichomonas   . Yeast infection     Patient Active Problem List   Diagnosis Date Noted  . Smoking 11/26/2013  . Irregular uterine bleeding 04/04/2012  . Diabetes mellitus complicating pregnancy, childbirth, or the puerperium 07/21/2011  . DIABETES MELLITUS II, UNCOMPLICATED 17/49/4496  . HYPERLIPIDEMIA  02/02/2007  . BIPOLAR DISORDER 02/02/2007  . HYPERTENSION, BENIGN SYSTEMIC 02/02/2007    Past Surgical History:  Procedure Laterality Date  . CESAREAN SECTION  1991    OB History    Gravida Para Term Preterm AB Living   '6 4 1 3 2 4   '$ SAB TAB Ectopic Multiple Live Births   1 1 0 0 3       Home Medications    Prior to Admission medications   Medication Sig Start Date End Date Taking? Authorizing Provider  amoxicillin (AMOXIL) 500 MG capsule Take 1 capsule (500 mg total) by mouth 3 (three) times daily. Patient not taking: Reported on 05/20/2016 03/13/16   Janne Napoleon, NP  Blood Glucose Monitoring Suppl (TRUE METRIX METER) w/Device KIT Check sugars 3 times per day for E11.9 12/22/15   Lance Bosch, NP  diphenhydrAMINE (SOMINEX) 25 MG tablet Take 25 mg by mouth at bedtime as needed for itching, allergies or sleep.    Historical Provider, MD  gabapentin (NEURONTIN) 300 MG capsule Take 1 capsule (300 mg total) by mouth 2 (two) times daily as needed. 05/20/16   Harvel Quale, MD  glucose blood (TRUE METRIX BLOOD GLUCOSE TEST) test strip Check sugars 3 times per day for E11.9 12/22/15   Lance Bosch, NP  HYDROcodone-acetaminophen (NORCO/VICODIN) [phone]  MG tablet Take 1 tablet by mouth every 4 (four) hours as needed. Patient not taking: Reported on 05/20/2016 03/13/16   Hayden Rasmussen, NP  ibuprofen (ADVIL,MOTRIN) 200 MG tablet Take 200 mg by mouth every 6 (six) hours as needed for moderate pain.    Historical Provider, MD  Insulin Detemir (LEVEMIR) 100 UNIT/ML Pen Inject 20 units every morning and 25 units every evening. 01/08/16   Quentin Angst, MD  Insulin Pen Needle (BD PEN NEEDLE NANO U/F) 32G X 4 MM MISC Use as directed for twice daily insulin dosing 01/08/16   Quentin Angst, MD  Insulin Syringe-Needle U-100 (TRUEPLUS INSULIN SYRINGE) 30G X 5/16" 1 ML MISC GIve insulin twice daily for E11.9 12/22/15   Ambrose Finland, NP  Insulin Syringe-Needle U-100 28G X 1/2" 0.3 ML MISC Check blood  sugar TID & QHS 12/23/15   Ambrose Finland, NP  lisinopril-hydrochlorothiazide (PRINZIDE,ZESTORETIC) 10-12.5 MG tablet Take 1 tablet by mouth daily. 12/22/15   Ambrose Finland, NP  metroNIDAZOLE (FLAGYL) 500 MG tablet Take 1 tablet (500 mg total) by mouth 2 (two) times daily. Patient not taking: Reported on 05/20/2016 01/10/16   Rhona Raider Stinson, DO  Saxagliptin-Metformin 04-999 MG TB24 Take 1 tablet by mouth daily. 12/22/15   Ambrose Finland, NP  TRUEPLUS LANCETS 28G MISC Check sugars 3 times per day for E11.9 12/22/15   Ambrose Finland, NP  valACYclovir (VALTREX) 500 MG tablet Take 1 tablet (500 mg total) by mouth 2 (two) times daily. Patient not taking: Reported on 05/20/2016 07/16/15   Armando Reichert, CNM    Family History Family History  Problem Relation Age of Onset  . Hypertension Mother   . Diabetes Mother   . Heart disease Mother   . Hypertension Father   . Diabetes Father   . Heart disease Father   . Stroke Maternal Grandfather   . Other Neg Hx     Social History Social History  Substance Use Topics  . Smoking status: Current Every Day Smoker    Packs/day: 0.50    Years: 20.00    Types: Cigarettes  . Smokeless tobacco: Never Used  . Alcohol use No     Allergies   Strawberry extract and Sulfa antibiotics   Review of Systems Review of Systems  Constitutional: Negative.   HENT: Negative.   Respiratory: Negative.   Cardiovascular: Negative.   Musculoskeletal: Positive for arthralgias and joint swelling.     Physical Exam Triage Vital Signs ED Triage Vitals [10/13/16 1301]  Enc Vitals Group     BP 157/88     Pulse Rate 108     Resp 18     Temp 97.3 F (36.3 C)     Temp src      SpO2 98 %     Weight      Height      Head Circumference      Peak Flow      Pain Score 10     Pain Loc      Pain Edu?      Excl. in GC?    No data found.   Updated Vital Signs BP 157/88   Pulse 108   Temp 97.3 F (36.3 C)   Resp 18   SpO2 98%      Physical Exam    Constitutional: She appears well-developed and well-nourished.  HENT:  Head: Normocephalic.  Right Ear: External ear normal.  Left Ear: External ear normal.  Mouth/Throat:  Oropharynx is clear and moist.  Eyes: Conjunctivae are normal. Pupils are equal, round, and reactive to light.  Neck: Normal range of motion.  Pulmonary/Chest: Effort normal.  Musculoskeletal:  Patient has a typical paronychia on the radial side of her left index finger.  Patient has mild swelling and tenderness with the dorsal PIP joint of her left ring finger.  Skin: Skin is warm and dry.  Nursing note and vitals reviewed.    UC Treatments / Results  Labs (all labs ordered are listed, but only abnormal results are displayed) Labs Reviewed - No data to display  EKG  EKG Interpretation None       Radiology No results found.  Procedures .Marland KitchenIncision and Drainage Date/Time: 10/13/2016 1:50 PM Performed by: Robyn Haber Authorized by: Robyn Haber   Consent:    Consent obtained:  Verbal   Consent given by:  Patient   Risks discussed:  Incomplete drainage and infection   Alternatives discussed:  No treatment Location:    Type:  Abscess   Location:  Upper extremity   Upper extremity location:  Finger   Finger location:  L index finger Pre-procedure details:    Skin preparation:  Antiseptic wash Anesthesia (see MAR for exact dosages):    Anesthesia method:  None Procedure type:    Complexity:  Simple Procedure details:    Needle aspiration: no     Incision types:  Stab incision   Incision depth:  Dermal   Scalpel blade:  11   Drainage:  Purulent   Wound treatment:  Wound left open   Packing materials:  None Post-procedure details:    Patient tolerance of procedure:  Tolerated well, no immediate complications   (including critical care time)  Medications Ordered in UC Medications - No data to display   Initial Impression / Assessment and Plan / UC Course  I have reviewed the  triage vital signs and the nursing notes.  Pertinent labs & imaging results that were available during my care of the patient were reviewed by me and considered in my medical decision making (see chart for details).  Clinical Course     Final Clinical Impressions(s) / UC Diagnoses   Final diagnoses:  None    New Prescriptions New Prescriptions   No medications on file     Robyn Haber, MD 10/13/16 1355

## 2016-10-13 NOTE — ED Triage Notes (Signed)
Pt here for left hand swelling most in ring finger and index finger to left hand. Redness and swelling to index finger.

## 2016-10-21 ENCOUNTER — Encounter: Payer: Self-pay | Admitting: Family Medicine

## 2016-10-21 ENCOUNTER — Other Ambulatory Visit: Payer: Self-pay | Admitting: Pharmacist

## 2016-10-21 ENCOUNTER — Ambulatory Visit: Payer: Medicare Other | Attending: Family Medicine | Admitting: Family Medicine

## 2016-10-21 VITALS — BP 152/90 | HR 92 | Temp 98.5°F | Ht 64.0 in | Wt 205.2 lb

## 2016-10-21 DIAGNOSIS — Z8619 Personal history of other infectious and parasitic diseases: Secondary | ICD-10-CM | POA: Insufficient documentation

## 2016-10-21 DIAGNOSIS — E669 Obesity, unspecified: Secondary | ICD-10-CM | POA: Insufficient documentation

## 2016-10-21 DIAGNOSIS — B373 Candidiasis of vulva and vagina: Secondary | ICD-10-CM | POA: Insufficient documentation

## 2016-10-21 DIAGNOSIS — Z6835 Body mass index (BMI) 35.0-35.9, adult: Secondary | ICD-10-CM | POA: Insufficient documentation

## 2016-10-21 DIAGNOSIS — E119 Type 2 diabetes mellitus without complications: Secondary | ICD-10-CM | POA: Insufficient documentation

## 2016-10-21 DIAGNOSIS — I10 Essential (primary) hypertension: Secondary | ICD-10-CM | POA: Insufficient documentation

## 2016-10-21 DIAGNOSIS — Z87440 Personal history of urinary (tract) infections: Secondary | ICD-10-CM | POA: Insufficient documentation

## 2016-10-21 DIAGNOSIS — M7989 Other specified soft tissue disorders: Secondary | ICD-10-CM | POA: Diagnosis not present

## 2016-10-21 DIAGNOSIS — Z23 Encounter for immunization: Secondary | ICD-10-CM | POA: Diagnosis not present

## 2016-10-21 DIAGNOSIS — F319 Bipolar disorder, unspecified: Secondary | ICD-10-CM | POA: Diagnosis not present

## 2016-10-21 DIAGNOSIS — E1165 Type 2 diabetes mellitus with hyperglycemia: Secondary | ICD-10-CM | POA: Insufficient documentation

## 2016-10-21 DIAGNOSIS — F17210 Nicotine dependence, cigarettes, uncomplicated: Secondary | ICD-10-CM | POA: Diagnosis not present

## 2016-10-21 DIAGNOSIS — Z794 Long term (current) use of insulin: Secondary | ICD-10-CM | POA: Insufficient documentation

## 2016-10-21 DIAGNOSIS — B3731 Acute candidiasis of vulva and vagina: Secondary | ICD-10-CM

## 2016-10-21 LAB — COMPLETE METABOLIC PANEL WITH GFR
ALT: 29 U/L (ref 6–29)
AST: 28 U/L (ref 10–30)
Albumin: 4.2 g/dL (ref 3.6–5.1)
Alkaline Phosphatase: 66 U/L (ref 33–115)
BILIRUBIN TOTAL: 0.3 mg/dL (ref 0.2–1.2)
BUN: 11 mg/dL (ref 7–25)
CO2: 24 mmol/L (ref 20–31)
Calcium: 9.5 mg/dL (ref 8.6–10.2)
Chloride: 100 mmol/L (ref 98–110)
Creat: 0.67 mg/dL (ref 0.50–1.10)
GFR, Est African American: >89 mL/min (ref >=60)
Glucose, Bld: 310 mg/dL — ABNORMAL HIGH (ref 65–99)
Potassium: 3.6 mmol/L (ref 3.5–5.3)
SODIUM: 133 mmol/L — AB (ref 135–146)
TOTAL PROTEIN: 7 g/dL (ref 6.1–8.1)

## 2016-10-21 LAB — LIPID PANEL
Cholesterol: 171 mg/dL (ref <200)
HDL: 62 mg/dL (ref >50)
LDL CALC: 86 mg/dL (ref <100)
Total CHOL/HDL Ratio: 2.8 Ratio (ref <5.0)
Triglycerides: 113 mg/dL (ref <150)
VLDL: 23 mg/dL (ref <30)

## 2016-10-21 LAB — POCT URINALYSIS DIPSTICK
BILIRUBIN UA: NEGATIVE
GLUCOSE UA: 500
KETONES UA: NEGATIVE
Leukocytes, UA: NEGATIVE
Nitrite, UA: NEGATIVE
PH UA: 6
Spec Grav, UA: <=1.005
Urobilinogen, UA: 0.2

## 2016-10-21 LAB — CBC
HEMATOCRIT: 37.5 % (ref 35.0–45.0)
Hemoglobin: 12.6 g/dL (ref 11.7–15.5)
MCH: 30.4 pg (ref 27.0–33.0)
MCHC: 33.6 g/dL (ref 32.0–36.0)
MCV: 90.4 fL (ref 80.0–100.0)
MPV: 11.3 fL (ref 7.5–12.5)
Platelets: 213 10*3/uL (ref 140–400)
RBC: 4.15 MIL/uL (ref 3.80–5.10)
RDW: 14.6 % (ref 11.0–15.0)
WBC: 7.4 10*3/uL (ref 3.8–10.8)

## 2016-10-21 LAB — GLUCOSE, POCT (MANUAL RESULT ENTRY): POC Glucose: 329 mg/dl — AB (ref 70–99)

## 2016-10-21 MED ORDER — GABAPENTIN 300 MG PO CAPS: ORAL_CAPSULE | ORAL | 2 refills | Status: DC

## 2016-10-21 MED ORDER — TRUE METRIX METER W/DEVICE KIT: 1.0000 | PACK | 0 refills | Status: DC | PRN

## 2016-10-21 MED ORDER — TRUEPLUS LANCETS 28G MISC: 1.0000 | 3 refills | Status: DC

## 2016-10-21 MED ORDER — AMLODIPINE BESYLATE 10 MG PO TABS: 10.0000 mg | ORAL_TABLET | Freq: Every day | ORAL | 3 refills | Status: DC

## 2016-10-21 MED ORDER — INSULIN PEN NEEDLE 31G X 8 MM MISC: 1.0000 "application " | Freq: Every day | 3 refills | Status: DC

## 2016-10-21 MED ORDER — SAXAGLIPTIN-METFORMIN ER 2.5-1000 MG PO TB24: 2.0000 | ORAL_TABLET | Freq: Every day | ORAL | 5 refills | Status: DC

## 2016-10-21 MED ORDER — CLONIDINE HCL 0.1 MG PO TABS
0.1000 mg | ORAL_TABLET | Freq: Once | ORAL | Status: AC
  Administered 2016-10-21: 0.1 mg via ORAL

## 2016-10-21 MED ORDER — GLUCOSE BLOOD VI STRP: 1.0000 | ORAL_STRIP | 3 refills | Status: DC

## 2016-10-21 MED ORDER — FLUCONAZOLE 150 MG PO TABS: 150.0000 mg | ORAL_TABLET | ORAL | 0 refills | Status: DC

## 2016-10-21 MED ORDER — LISINOPRIL 40 MG PO TABS: ORAL_TABLET | ORAL | 5 refills | Status: DC

## 2016-10-21 MED ORDER — INSULIN ASPART 100 UNIT/ML ~~LOC~~ SOLN
10.0000 [IU] | Freq: Once | SUBCUTANEOUS | Status: AC
  Administered 2016-10-21: 10 [IU] via SUBCUTANEOUS

## 2016-10-21 MED ORDER — INSULIN GLARGINE 300 UNIT/ML ~~LOC~~ SOPN: 10.0000 [IU] | PEN_INJECTOR | SUBCUTANEOUS | 5 refills | Status: DC

## 2016-10-21 NOTE — Progress Notes (Signed)
Pt is here today to follow up on diabetes, and pt is having swelling in left ring finger.

## 2016-10-21 NOTE — Progress Notes (Signed)
Subjective:  Patient ID: Sarah Long, female    DOB: 09-Sep-1971  Age: 45 y.o. MRN: [id number]  CC: Diabetes   HPI Sarah Long presents for    1. CHRONIC DIABETES: started at age 80 with gestational diabetes. Then returned at age 64 with type 2 diabetes. Out of medications for past 4-5 months.   Disease Monitoring  Blood Sugar Ranges: not checking   Polyuria: yes   Visual problems: yes   Medication Compliance: no  Medication Side Effects  Hypoglycemia: no   Preventitive Health Care  Eye Exam: due   Foot Exam: done today   Diet pattern: not compliant with low sugar   Exercise: yes. Walks and zumba   2. CHRONIC HYPERTENSION: no medications for 4-5 months. Tolerated prinzide without cough. No CP, SOB or swelling.   3. Left 4th finger swelling: x 3  Weeks. At PIP joint. Tender to touch. Darkness of joint. Stiffness. No redness. She took doxycycline for recent paronychia of index finger on same hand without significant improvement.   Past Medical History:  Diagnosis Date  . Abnormal Pap smear 1998  . Abnormal vaginal bleeding 12/19/2011  . Bacterial infection   . Bipolar 1 disorder ([place])   . Candida vaginitis 07/2007  . Depression    recently added wellbutrin-has not taken yet for bipolar  . Diabetes in pregnancy   . Diabetes mellitus    nph 20U qam and qpm, regular with meals  . Fibroid   . Galactorrhea of right breast 2008  . H/O amenorrhea 07/2007  . H/O dizziness 10/14/11  . H/O dysmenorrhea 2010  . H/O menorrhagia 10/14/11  . H/O varicella   . Headache(784.0)   . Heavy vaginal bleeding due to contraceptive injection use 10/12/2011   Depo Provera  . Herpes   . HSV-2 infection 01/03/2009  . Hx: UTI (urinary tract infection) 2009  . Hypertension    on aldomet  . Increased BMI 2010  . Obesity 10/14/11  . Oligomenorrhea 07/2007  . Pelvic pain in female   . Preterm labor   . Trichomonas   . Yeast infection     Social History  Substance Use Topics  . Smoking  status: Current Every Day Smoker    Packs/day: 0.50    Years: 20.00    Types: Cigarettes  . Smokeless tobacco: Never Used  . Alcohol use No    Outpatient Medications Prior to Visit  Medication Sig Dispense Refill  . Blood Glucose Monitoring Suppl (TRUE METRIX METER) w/Device KIT Check sugars 3 times per day for E11.9 1 kit 0  . doxycycline (VIBRA-TABS) 100 MG tablet Take 1 tablet (100 mg total) by mouth 2 (two) times daily. 20 tablet 0  . diphenhydrAMINE (SOMINEX) 25 MG tablet Take 25 mg by mouth at bedtime as needed for itching, allergies or sleep.    [name] gabapentin (NEURONTIN) 300 MG capsule Take 1 capsule (300 mg total) by mouth 2 (two) times daily as needed. (Patient not taking: Reported on 10/21/2016) 20 capsule 0  . glucose blood (TRUE METRIX BLOOD GLUCOSE TEST) test strip Check sugars 3 times per day for E11.9 (Patient not taking: Reported on 10/21/2016) 100 each 12  . HYDROcodone-acetaminophen (NORCO) 5-325 MG tablet Take 1 tablet by mouth every 6 (six) hours as needed for moderate pain. (Patient not taking: Reported on 10/21/2016) 12 tablet 0  . ibuprofen (ADVIL,MOTRIN) 200 MG tablet Take 200 mg by mouth every 6 (six) hours as needed for moderate pain.    [name]  Insulin Detemir (LEVEMIR) 100 UNIT/ML Pen Inject 20 units every morning and 25 units every evening. (Patient not taking: Reported on 10/21/2016) 15 mL 11  . Insulin Pen Needle (BD PEN NEEDLE NANO U/F) 32G X 4 MM MISC Use as directed for twice daily insulin dosing (Patient not taking: Reported on 10/21/2016) 100 each 11  . Insulin Syringe-Needle U-100 (TRUEPLUS INSULIN SYRINGE) 30G X 5/16" 1 ML MISC GIve insulin twice daily for E11.9 (Patient not taking: Reported on 10/21/2016) 100 each 12  . Insulin Syringe-Needle U-100 28G X 1/2" 0.3 ML MISC Check blood sugar TID & QHS (Patient not taking: Reported on 10/21/2016) 100 each 2  . lisinopril-hydrochlorothiazide (PRINZIDE,ZESTORETIC) 10-12.5 MG tablet Take 1 tablet by mouth daily. (Patient  not taking: Reported on 10/21/2016) 30 tablet 4  . metroNIDAZOLE (FLAGYL) 500 MG tablet Take 1 tablet (500 mg total) by mouth 2 (two) times daily. (Patient not taking: Reported on 10/21/2016) 14 tablet 0  . Saxagliptin-Metformin 04-999 MG TB24 Take 1 tablet by mouth daily. (Patient not taking: Reported on 10/21/2016) 30 tablet 4  . TRUEPLUS LANCETS 28G MISC Check sugars 3 times per day for E11.9 (Patient not taking: Reported on 10/21/2016) 100 each 12  . valACYclovir (VALTREX) 500 MG tablet Take 1 tablet (500 mg total) by mouth 2 (two) times daily. (Patient not taking: Reported on 10/21/2016) 60 tablet 0   No facility-administered medications prior to visit.     ROS Review of Systems  Constitutional: Negative for chills and fever.  Eyes: Positive for visual disturbance.  Respiratory: Negative for shortness of breath.   Cardiovascular: Negative for chest pain.  Gastrointestinal: Negative for abdominal pain and blood in stool.  Endocrine: Positive for polydipsia, polyphagia and polyuria.  Genitourinary: Positive for vaginal discharge.  Musculoskeletal: Positive for joint swelling (swelling in left 4th finger ). Negative for arthralgias and back pain.  Skin: Negative for rash.  Allergic/Immunologic: Negative for immunocompromised state.  Neurological: Positive for numbness (in feet ).  Hematological: Negative for adenopathy. Does not bruise/bleed easily.  Psychiatric/Behavioral: Negative for dysphoric mood and suicidal ideas.    Objective:  BP (!) 167/106 (BP Location: Left Arm, Patient Position: Sitting, Cuff Size: Small)   Pulse 92   Temp 98.5 F (36.9 C) (Oral)   Ht _0  (1.626 m)   Wt 205 lb 3.2 oz (93.1 kg)   LMP 09/08/2016   SpO2 99%   BMI 35.22 kg/m   BP/Weight 10/21/2016 10/13/2016 07/16/5725  Systolic BP 203 559 741  Diastolic BP 638 88 95  Wt. (Lbs) 205.2 - -  BMI 35.22 - -    Physical Exam  Constitutional: She is oriented to person, place, and time. She appears  well-developed and well-nourished. No distress.  HENT:  Head: Normocephalic and atraumatic.  Cardiovascular: Normal rate, regular rhythm, normal heart sounds and intact distal pulses.   Pulmonary/Chest: Effort normal and breath sounds normal.  Musculoskeletal: She exhibits no edema.       Hands: Neurological: She is alert and oriented to person, place, and time.  Skin: Skin is warm and dry. No rash noted.  Psychiatric: She has a normal mood and affect.   Lab Results  Component Value Date   HGBA1C 12.50 12/22/2015   A1c 12.7  CBG 382  Treated with 10 U of novolog UA 500 glucose, trace protein, negative ketones   Repeat CBG 329 Repeat BP 152/90  Assessment & Plan:   Samanda was seen today for diabetes.  Diagnoses and all orders for  this visit:  Uncontrolled type 2 diabetes mellitus with hyperglycemia, without long-term current use of insulin ([facility]) -     insulin aspart (novoLOG) injection 10 Units; Inject 0.1 mLs (10 Units total) into the skin once. -     POCT urinalysis dipstick -     Lipid Panel -     Discontinue: gabapentin (NEURONTIN) 300 MG capsule; Take by mouth,  300 mg at night for first week, then 300 mg twice daily -     Discontinue: Insulin Glargine (TOUJEO SOLOSTAR) 300 UNIT/ML SOPN; Inject 10-45 Units into the skin every morning. Range for dose titration -     Discontinue: Saxagliptin-Metformin (KOMBIGLYZE XR) 2.04-999 MG TB24; Take 2 tablets by mouth daily. -     Discontinue: Insulin Pen Needle (B-D ULTRAFINE III SHORT PEN) 31G X 8 MM MISC; 1 application by Does not apply route daily. -     Discontinue: Blood Glucose Monitoring Suppl (TRUE METRIX METER) w/Device KIT; 1 each by Does not apply route as needed. -     Discontinue: glucose blood (TRUE METRIX BLOOD GLUCOSE TEST) test strip; 1 each by Other route every morning. -     Discontinue: TRUEPLUS LANCETS 28G MISC; 1 each by Does not apply route every morning. -     Blood Glucose Monitoring Suppl (TRUE METRIX METER)  w/Device KIT; 1 each by Does not apply route as needed. -     gabapentin (NEURONTIN) 300 MG capsule; Take by mouth,  300 mg at night for first week, then 300 mg twice daily -     glucose blood (TRUE METRIX BLOOD GLUCOSE TEST) test strip; 1 each by Other route every morning. -     Insulin Glargine (TOUJEO SOLOSTAR) 300 UNIT/ML SOPN; Inject 10-45 Units into the skin every morning. Range for dose titration -     Insulin Pen Needle (B-D ULTRAFINE III SHORT PEN) 31G X 8 MM MISC; 1 application by Does not apply route daily. -     Saxagliptin-Metformin (KOMBIGLYZE XR) 2.04-999 MG TB24; Take 2 tablets by mouth daily. -     TRUEPLUS LANCETS 28G MISC; 1 each by Does not apply route every morning. -     Glucose (CBG)  HYPERTENSION, BENIGN SYSTEMIC -     cloNIDine (CATAPRES) tablet 0.1 mg; Take 1 tablet (0.1 mg total) by mouth once. -     COMPLETE METABOLIC PANEL WITH GFR -     Discontinue: lisinopril (PRINIVIL,ZESTRIL) 40 MG tablet; Take 20 mg (1/2 tablet) for two weeks then 40 mg daily -     Discontinue: amLODipine (NORVASC) 10 MG tablet; Take 1 tablet (10 mg total) by mouth daily. -     amLODipine (NORVASC) 10 MG tablet; Take 1 tablet (10 mg total) by mouth daily. -     lisinopril (PRINIVIL,ZESTRIL) 40 MG tablet; Take 20 mg (1/2 tablet) for two weeks then 40 mg daily  Vaginal yeast infection -     fluconazole (DIFLUCAN) 150 MG tablet; Take 1 tablet (150 mg total) by mouth every 3 (three) days.  Swelling of left ring finger -     DG Finger Ring Left; Future -     ANA,IFA RA Diag Pnl w/rflx Tit/Patn -     CBC -     Uric Acid  Encounter for immunization -     Flu Vaccine QUAD 36+ mos IM   Meds ordered this encounter  Medications  . cloNIDine (CATAPRES) tablet 0.1 mg  . insulin aspart (novoLOG) injection 10 Units  .  fluconazole (DIFLUCAN) 150 MG tablet    Sig: Take 1 tablet (150 mg total) by mouth every 3 (three) days.    Dispense:  2 tablet    Refill:  0  . DISCONTD: gabapentin (NEURONTIN)  300 MG capsule    Sig: Take by mouth,  300 mg at night for first week, then 300 mg twice daily    Dispense:  60 capsule    Refill:  2  . DISCONTD: lisinopril (PRINIVIL,ZESTRIL) 40 MG tablet    Sig: Take 20 mg (1/2 tablet) for two weeks then 40 mg daily    Dispense:  30 tablet    Refill:  5  . DISCONTD: amLODipine (NORVASC) 10 MG tablet    Sig: Take 1 tablet (10 mg total) by mouth daily.    Dispense:  90 tablet    Refill:  3  . DISCONTD: Insulin Glargine (TOUJEO SOLOSTAR) 300 UNIT/ML SOPN    Sig: Inject 10-45 Units into the skin every morning. Range for dose titration    Dispense:  4.5 mL    Refill:  5  . DISCONTD: Saxagliptin-Metformin (KOMBIGLYZE XR) 2.04-999 MG TB24    Sig: Take 2 tablets by mouth daily.    Dispense:  60 tablet    Refill:  5  . DISCONTD: Insulin Pen Needle (B-D ULTRAFINE III SHORT PEN) 31G X 8 MM MISC    Sig: 1 application by Does not apply route daily.    Dispense:  90 each    Refill:  3  . DISCONTD: Blood Glucose Monitoring Suppl (TRUE METRIX METER) w/Device KIT    Sig: 1 each by Does not apply route as needed.    Dispense:  1 kit    Refill:  0  . DISCONTD: glucose blood (TRUE METRIX BLOOD GLUCOSE TEST) test strip    Sig: 1 each by Other route every morning.    Dispense:  90 each    Refill:  3  . DISCONTD: TRUEPLUS LANCETS 28G MISC    Sig: 1 each by Does not apply route every morning.    Dispense:  90 each    Refill:  3  . amLODipine (NORVASC) 10 MG tablet    Sig: Take 1 tablet (10 mg total) by mouth daily.    Dispense:  90 tablet    Refill:  3  . Blood Glucose Monitoring Suppl (TRUE METRIX METER) w/Device KIT    Sig: 1 each by Does not apply route as needed.    Dispense:  1 kit    Refill:  0  . gabapentin (NEURONTIN) 300 MG capsule    Sig: Take by mouth,  300 mg at night for first week, then 300 mg twice daily    Dispense:  60 capsule    Refill:  2  . glucose blood (TRUE METRIX BLOOD GLUCOSE TEST) test strip    Sig: 1 each by Other route every  morning.    Dispense:  90 each    Refill:  3  . Insulin Glargine (TOUJEO SOLOSTAR) 300 UNIT/ML SOPN    Sig: Inject 10-45 Units into the skin every morning. Range for dose titration    Dispense:  4.5 mL    Refill:  5  . Insulin Pen Needle (B-D ULTRAFINE III SHORT PEN) 31G X 8 MM MISC    Sig: 1 application by Does not apply route daily.    Dispense:  90 each    Refill:  3  . lisinopril (PRINIVIL,ZESTRIL) 40 MG tablet  Sig: Take 20 mg (1/2 tablet) for two weeks then 40 mg daily    Dispense:  30 tablet    Refill:  5  . Saxagliptin-Metformin (KOMBIGLYZE XR) 2.04-999 MG TB24    Sig: Take 2 tablets by mouth daily.    Dispense:  60 tablet    Refill:  5  . TRUEPLUS LANCETS 28G MISC    Sig: 1 each by Does not apply route every morning.    Dispense:  90 each    Refill:  3    Follow-up: Return in about 4 weeks (around 11/18/2016) for HTN and DM2.   Boykin Nearing MD

## 2016-10-21 NOTE — Assessment & Plan Note (Signed)
Uncontrolled with hyperglycemia No medications  Plan: Restart kombiglyze toujeo taper starting at 10 U

## 2016-10-21 NOTE — Assessment & Plan Note (Signed)
Swelling at PIP joint on L ring finger X-ray Labs per orders

## 2016-10-21 NOTE — Assessment & Plan Note (Signed)
Uncontrolled with med non compliance  Start norvasc and lisinopril

## 2016-10-21 NOTE — Assessment & Plan Note (Signed)
Treat with diflucan. 

## 2016-10-21 NOTE — Patient Instructions (Addendum)
Sarah Long was seen today for diabetes.  Diagnoses and all orders for this visit:  Uncontrolled type 2 diabetes mellitus with hyperglycemia, without long-term current use of insulin (HCC) -     insulin aspart (novoLOG) injection 10 Units; Inject 0.1 mLs (10 Units total) into the skin once. -     POCT urinalysis dipstick -     Lipid Panel -     Discontinue: gabapentin (NEURONTIN) 300 MG capsule; Take by mouth,  300 mg at night for first week, then 300 mg twice daily -     Discontinue: Insulin Glargine (TOUJEO SOLOSTAR) 300 UNIT/ML SOPN; Inject 10-45 Units into the skin every morning. Range for dose titration -     Discontinue: Saxagliptin-Metformin (KOMBIGLYZE XR) 2.04-999 MG TB24; Take 2 tablets by mouth daily. -     Discontinue: Insulin Pen Needle (B-D ULTRAFINE III SHORT PEN) 31G X 8 MM MISC; 1 application by Does not apply route daily. -     Discontinue: Blood Glucose Monitoring Suppl (TRUE METRIX METER) w/Device KIT; 1 each by Does not apply route as needed. -     Discontinue: glucose blood (TRUE METRIX BLOOD GLUCOSE TEST) test strip; 1 each by Other route every morning. -     Discontinue: TRUEPLUS LANCETS 28G MISC; 1 each by Does not apply route every morning. -     Blood Glucose Monitoring Suppl (TRUE METRIX METER) w/Device KIT; 1 each by Does not apply route as needed. -     gabapentin (NEURONTIN) 300 MG capsule; Take by mouth,  300 mg at night for first week, then 300 mg twice daily -     glucose blood (TRUE METRIX BLOOD GLUCOSE TEST) test strip; 1 each by Other route every morning. -     Insulin Glargine (TOUJEO SOLOSTAR) 300 UNIT/ML SOPN; Inject 10-45 Units into the skin every morning. Range for dose titration -     Insulin Pen Needle (B-D ULTRAFINE III SHORT PEN) 31G X 8 MM MISC; 1 application by Does not apply route daily. -     Saxagliptin-Metformin (KOMBIGLYZE XR) 2.04-999 MG TB24; Take 2 tablets by mouth daily. -     TRUEPLUS LANCETS 28G MISC; 1 each by Does not apply route every  morning. -     Glucose (CBG)  HYPERTENSION, BENIGN SYSTEMIC -     cloNIDine (CATAPRES) tablet 0.1 mg; Take 1 tablet (0.1 mg total) by mouth once. -     COMPLETE METABOLIC PANEL WITH GFR -     Discontinue: lisinopril (PRINIVIL,ZESTRIL) 40 MG tablet; Take 20 mg (1/2 tablet) for two weeks then 40 mg daily -     Discontinue: amLODipine (NORVASC) 10 MG tablet; Take 1 tablet (10 mg total) by mouth daily. -     amLODipine (NORVASC) 10 MG tablet; Take 1 tablet (10 mg total) by mouth daily. -     lisinopril (PRINIVIL,ZESTRIL) 40 MG tablet; Take 20 mg (1/2 tablet) for two weeks then 40 mg daily  Vaginal yeast infection -     fluconazole (DIFLUCAN) 150 MG tablet; Take 1 tablet (150 mg total) by mouth every 3 (three) days.  Swelling of left ring finger -     DG Finger Ring Left; Future -     ANA,IFA RA Diag Pnl w/rflx Tit/Patn -     CBC -     Uric Acid    Taper up on toujeo  Start with 10 U on day one then increase by 2 U every 2 days working towards goal fasting  sugar of 80-130  If CBG in Am is 80-130 take same toujeo  dose as previous day If CBG < 80 reduce toujeo  dose by 2 U for previous day  If CBG > 130 increase toujeo dose by 2 U from previous day   F/u in 4 weeks for diabetes and HTN  Dr. Adrian Blackwater

## 2016-10-22 ENCOUNTER — Telehealth: Payer: Self-pay

## 2016-10-22 LAB — ANA,IFA RA DIAG PNL W/RFLX TIT/PATN
Anti Nuclear Antibody(ANA): NEGATIVE
Cyclic Citrullin Peptide Ab: <16 Units

## 2016-10-22 LAB — URIC ACID: Uric Acid, Serum: 4.1 mg/dL (ref 2.5–7.0)

## 2016-10-22 MED ORDER — ASPIRIN 81 MG PO TABS: 81.0000 mg | ORAL_TABLET | Freq: Every day | ORAL | 3 refills | Status: DC

## 2016-10-22 MED ORDER — ATORVASTATIN CALCIUM 40 MG PO TABS: 40.0000 mg | ORAL_TABLET | Freq: Every day | ORAL | 3 refills | Status: DC

## 2016-10-22 NOTE — Telephone Encounter (Signed)
Pt was called and informed of lab results and medications being sent to her pharmacy.

## 2016-10-22 NOTE — Addendum Note (Signed)
Addended by: Boykin Nearing on: 10/22/2016 01:11 PM   Modules accepted: Orders

## 2016-11-02 ENCOUNTER — Telehealth: Payer: Self-pay | Admitting: Family Medicine

## 2016-11-02 DIAGNOSIS — B3731 Acute candidiasis of vulva and vagina: Secondary | ICD-10-CM

## 2016-11-02 DIAGNOSIS — B373 Candidiasis of vulva and vagina: Secondary | ICD-10-CM

## 2016-11-02 NOTE — Telephone Encounter (Signed)
I cannot refill this - will forward to Dr. Adrian Blackwater.

## 2016-11-02 NOTE — Telephone Encounter (Signed)
Patient needs rx diflucan sent to [facility] on [address]. Please follow up.

## 2016-11-03 ENCOUNTER — Encounter (HOSPITAL_COMMUNITY): Payer: Self-pay | Admitting: *Deleted

## 2016-11-03 ENCOUNTER — Emergency Department (HOSPITAL_COMMUNITY)
Admission: RE | Admit: 2016-11-03 | Discharge: 2016-11-03 | Disposition: A | Discharge status: Home / Self Care | Payer: Medicare Other | Source: Ambulatory Visit | Attending: Family Medicine | Admitting: Family Medicine

## 2016-11-03 ENCOUNTER — Emergency Department (HOSPITAL_COMMUNITY)
Admission: EM | Admit: 2016-11-03 | Discharge: 2016-11-03 | Disposition: A | Discharge status: Home / Self Care | Payer: Medicare Other | Attending: Emergency Medicine | Admitting: Emergency Medicine

## 2016-11-03 DIAGNOSIS — M7989 Other specified soft tissue disorders: Secondary | ICD-10-CM | POA: Diagnosis not present

## 2016-11-03 DIAGNOSIS — I10 Essential (primary) hypertension: Secondary | ICD-10-CM | POA: Diagnosis not present

## 2016-11-03 DIAGNOSIS — Y9301 Activity, walking, marching and hiking: Secondary | ICD-10-CM | POA: Diagnosis not present

## 2016-11-03 DIAGNOSIS — Z79899 Other long term (current) drug therapy: Secondary | ICD-10-CM | POA: Insufficient documentation

## 2016-11-03 DIAGNOSIS — X58XXXA Exposure to other specified factors, initial encounter: Secondary | ICD-10-CM | POA: Insufficient documentation

## 2016-11-03 DIAGNOSIS — S99922A Unspecified injury of left foot, initial encounter: Secondary | ICD-10-CM | POA: Diagnosis present

## 2016-11-03 DIAGNOSIS — Y929 Unspecified place or not applicable: Secondary | ICD-10-CM | POA: Insufficient documentation

## 2016-11-03 DIAGNOSIS — S90422A Blister (nonthermal), left great toe, initial encounter: Secondary | ICD-10-CM | POA: Diagnosis not present

## 2016-11-03 DIAGNOSIS — Y999 Unspecified external cause status: Secondary | ICD-10-CM | POA: Diagnosis not present

## 2016-11-03 DIAGNOSIS — Z7982 Long term (current) use of aspirin: Secondary | ICD-10-CM | POA: Insufficient documentation

## 2016-11-03 DIAGNOSIS — Z794 Long term (current) use of insulin: Secondary | ICD-10-CM | POA: Insufficient documentation

## 2016-11-03 DIAGNOSIS — F17210 Nicotine dependence, cigarettes, uncomplicated: Secondary | ICD-10-CM | POA: Insufficient documentation

## 2016-11-03 DIAGNOSIS — E119 Type 2 diabetes mellitus without complications: Secondary | ICD-10-CM | POA: Diagnosis not present

## 2016-11-03 DIAGNOSIS — Z23 Encounter for immunization: Secondary | ICD-10-CM

## 2016-11-03 DIAGNOSIS — M79645 Pain in left finger(s): Secondary | ICD-10-CM | POA: Diagnosis not present

## 2016-11-03 MED ORDER — CEPHALEXIN 500 MG PO CAPS
500.0000 mg | ORAL_CAPSULE | Freq: Two times a day (BID) | ORAL | 0 refills | Status: AC
Start: 2016-11-03 — End: 2016-11-10

## 2016-11-03 NOTE — Discharge Instructions (Signed)
Please read and follow all provided instructions.  Your diagnoses today include:  1. Blister of left great toe, initial encounter     Tests performed today include: Vital signs. See below for your results today.   Medications prescribed:  Take as prescribed   Home care instructions:  Follow any educational materials contained in this packet.  Follow-up instructions: Please follow-up with your primary care provider for further evaluation of symptoms and treatment   Return instructions:  Please return to the Emergency Department if you do not get better, if you get worse, or new symptoms OR  - Fever (temperature greater than 101.88F)  - Bleeding that does not stop with holding pressure to the area    -Severe pain (please note that you may be more sore the day after your accident)  - Chest Pain  - Difficulty breathing  - Severe nausea or vomiting  - Inability to tolerate food and liquids  - Passing out  - Skin becoming red around your wounds  - Change in mental status (confusion or lethargy)  - New numbness or weakness    Please return if you have any other emergent concerns.  Additional Information:  Your vital signs today were: BP 120/73 (BP Location: Left Arm)    Pulse 93    Temp 98 F (36.7 C) (Oral)    Resp 16    LMP 09/08/2016    SpO2 98%  If your blood pressure (BP) was elevated above 135/85 this visit, please have this repeated by your doctor within one month. ---------------

## 2016-11-03 NOTE — ED Provider Notes (Signed)
[facility] DEPT Provider Note   CSN: [id number] Arrival date & time: 11/03/16  9379  By signing my name below, I, Sonum Patel, attest that this documentation has been prepared under the direction and in the presence of Shary Decamp, PA-C. Electronically Signed: Sonum Patel, Education administrator. 11/03/16. 10:31 AM.  History   Chief Complaint Chief Complaint  Patient presents with  . Foot Pain    The history is provided by the patient. No language interpreter was used.     HPI Comments: Sarah Long is a 45 y.o. female with past medical history of DM who presents to the Emergency Department complaining of constant, unchanged left great and 2nd toe swelling and numbness for the past 4 days. She states she often walks barefoot and without socks. She reports until recently her DM was uncontrolled but she now has follow up with a primary care provider. No fevers. No N/V. Able to ambulate without difficulty. No other symptoms noted  Past Medical History:  Diagnosis Date  . Abnormal Pap smear 1998  . Abnormal vaginal bleeding 12/19/2011  . Bacterial infection   . Bipolar 1 disorder ([place])   . Candida vaginitis 07/2007  . Depression    recently added wellbutrin-has not taken yet for bipolar  . Diabetes in pregnancy   . Diabetes mellitus    nph 20U qam and qpm, regular with meals  . Fibroid   . Galactorrhea of right breast 2008  . H/O amenorrhea 07/2007  . H/O dizziness 10/14/11  . H/O dysmenorrhea 2010  . H/O menorrhagia 10/14/11  . H/O varicella   . Headache(784.0)   . Heavy vaginal bleeding due to contraceptive injection use 10/12/2011   Depo Provera  . Herpes   . HSV-2 infection 01/03/2009  . Hx: UTI (urinary tract infection) 2009  . Hypertension    on aldomet  . Increased BMI 2010  . Obesity 10/14/11  . Oligomenorrhea 07/2007  . Pelvic pain in female   . Preterm labor   . Trichomonas   . Yeast infection     Patient Active Problem List   Diagnosis Date Noted  . Diabetes type 2,  uncontrolled ([place]) 10/21/2016  . Vaginal yeast infection 10/21/2016  . Swelling of left ring finger 10/21/2016  . Smoking 11/26/2013  . Irregular uterine bleeding 04/04/2012  . HYPERLIPIDEMIA 02/02/2007  . BIPOLAR DISORDER 02/02/2007  . HYPERTENSION, BENIGN SYSTEMIC 02/02/2007    Past Surgical History:  Procedure Laterality Date  . CESAREAN SECTION  1991    OB History    Gravida Para Term Preterm AB Living   '6 4 1 3 2 4   '$ SAB TAB Ectopic Multiple Live Births   1 1 0 0 3       Home Medications    Prior to Admission medications   Medication Sig Start Date End Date Taking? Authorizing Provider  amLODipine (NORVASC) 10 MG tablet Take 1 tablet (10 mg total) by mouth daily. 10/21/16   Boykin Nearing, MD  aspirin 81 MG tablet Take 1 tablet (81 mg total) by mouth daily. 10/22/16   Josalyn Funches, MD  atorvastatin (LIPITOR) 40 MG tablet Take 1 tablet (40 mg total) by mouth daily. 10/22/16   Josalyn Funches, MD  Blood Glucose Monitoring Suppl (TRUE METRIX METER) w/Device KIT 1 each by Does not apply route as needed. 10/21/16   Josalyn Funches, MD  diphenhydrAMINE (SOMINEX) 25 MG tablet Take 25 mg by mouth at bedtime as needed for itching, allergies or sleep.    Historical  Provider, MD  fluconazole (DIFLUCAN) 150 MG tablet Take 1 tablet (150 mg total) by mouth every 3 (three) days. 10/21/16   Boykin Nearing, MD  gabapentin (NEURONTIN) 300 MG capsule Take by mouth,  300 mg at night for first week, then 300 mg twice daily 10/21/16   Josalyn Funches, MD  glucose blood (TRUE METRIX BLOOD GLUCOSE TEST) test strip 1 each by Other route every morning. 10/21/16   Josalyn Funches, MD  ibuprofen (ADVIL,MOTRIN) 200 MG tablet Take 200 mg by mouth every 6 (six) hours as needed for moderate pain.    Historical Provider, MD  Insulin Glargine (TOUJEO SOLOSTAR) 300 UNIT/ML SOPN Inject 10-45 Units into the skin every morning. Range for dose titration 10/21/16   Josalyn Funches, MD  Insulin Pen Needle  (B-D ULTRAFINE III SHORT PEN) 31G X 8 MM MISC 1 application by Does not apply route daily. 10/21/16   Josalyn Funches, MD  lisinopril (PRINIVIL,ZESTRIL) 40 MG tablet Take 20 mg (1/2 tablet) for two weeks then 40 mg daily 10/21/16   Josalyn Funches, MD  Saxagliptin-Metformin (KOMBIGLYZE XR) 2.04-999 MG TB24 Take 2 tablets by mouth daily. 10/21/16   Boykin Nearing, MD  TRUEPLUS LANCETS 28G MISC 1 each by Does not apply route every morning. 10/21/16   Boykin Nearing, MD    Family History Family History  Problem Relation Age of Onset  . Hypertension Mother   . Diabetes Mother   . Heart disease Mother   . Hypertension Father   . Diabetes Father   . Heart disease Father   . Stroke Maternal Grandfather   . Other Neg Hx     Social History Social History  Substance Use Topics  . Smoking status: Current Every Day Smoker    Packs/day: 0.50    Years: 20.00    Types: Cigarettes  . Smokeless tobacco: Never Used  . Alcohol use No     Allergies   Strawberry extract and Sulfa antibiotics   Review of Systems Review of Systems  Skin: Positive for wound (blister).  Neurological: Positive for numbness.   Physical Exam Updated Vital Signs BP 120/73 (BP Location: Left Arm)   Pulse 93   Temp 98 F (36.7 C) (Oral)   Resp 16   LMP 09/08/2016   SpO2 98%   Physical Exam  Constitutional: She is oriented to person, place, and time. She appears well-developed and well-nourished.  HENT:  Head: Normocephalic and atraumatic.  Cardiovascular: Normal rate.   Pulmonary/Chest: Effort normal.  Neurological: She is alert and oriented to person, place, and time.  Skin: Skin is warm and dry. There is erythema.  1st and 2nd digit of left foot on distal aspect with small vesicles. Mild erythema on 2nd digit. No circumferential erythema or swelling around both digits. NVI  Psychiatric: She has a normal mood and affect.  Nursing note and vitals reviewed.  ED Treatments / Results  DIAGNOSTIC  STUDIES: Oxygen Saturation is 98% on RA, normal by my interpretation.    COORDINATION OF CARE: 10:36 AM Discussed treatment plan with pt at bedside and pt agreed to plan.    Labs (all labs ordered are listed, but only abnormal results are displayed) Labs Reviewed - No data to display  EKG  EKG Interpretation None       Radiology No results found.  Procedures Procedures (including critical care time)  Medications Ordered in ED Medications - No data to display   Initial Impression / Assessment and Plan / ED Course  I have  reviewed the triage vital signs and the nursing notes.  Pertinent labs & imaging results that were available during my care of the patient were reviewed by me and considered in my medical decision making (see chart for details).  Clinical Course     Final Clinical Impressions(s) / ED Diagnoses     I have reviewed the relevant previous healthcare records.  I obtained HPI from historian.   ED Course:  Assessment: Pt is a 44yF with hx DM who presents with swelling of 1st and 2nd digit of left foot. Notes mild erythema on 2nd digit. No ulcers. ROM intact. No circumferential swelling. No signs of serious infection. On exam, pt in NAD. Nontoxic/nonseptic appearing. VSS. Afebrile. Blisters noted on distal tips of 1st and 2nd digits of toes. Given mild erythema and risk factor of DM. Will Rx ABX and close follow up to PCP. Plan is to DC home. At time of discharge, Patient is in no acute distress. Vital Signs are stable. Patient is able to ambulate. Patient able to tolerate PO.    Disposition/Plan:  Dc Home Additional Verbal discharge instructions given and discussed with patient.  Pt Instructed to f/u with PCP in the next week for evaluation and treatment of symptoms. Return precautions given Pt acknowledges and agrees with plan  Supervising Physician Gareth Morgan, MD  Final diagnoses:  Blister of left great toe, initial encounter    New  Prescriptions New Prescriptions   No medications on file   I personally performed the services described in this documentation, which was scribed in my presence. The recorded information has been reviewed and is accurate.    Shary Decamp, PA-C 11/03/16 [place], MD 11/04/16 [phone]

## 2016-11-03 NOTE — ED Triage Notes (Signed)
Reports left big and second toe pain and mild swelling x 2 days. No redness or warmth noted at triage.

## 2016-11-04 MED ORDER — FLUCONAZOLE 150 MG PO TABS: 150.0000 mg | ORAL_TABLET | ORAL | 0 refills | Status: DC

## 2016-11-04 NOTE — Telephone Encounter (Signed)
Diflucan sent to pharmacy.

## 2016-11-05 ENCOUNTER — Telehealth: Payer: Self-pay

## 2016-11-08 ENCOUNTER — Telehealth: Payer: Self-pay

## 2016-11-08 DIAGNOSIS — M7989 Other specified soft tissue disorders: Secondary | ICD-10-CM

## 2016-11-08 NOTE — Telephone Encounter (Signed)
Filled medication.

## 2016-11-08 NOTE — Telephone Encounter (Signed)
Pt was called and informed of x ray results, pt states that her finger is still swollen and very painful.

## 2016-11-11 MED ORDER — NAPROXEN 500 MG PO TABS: 500.0000 mg | ORAL_TABLET | Freq: Two times a day (BID) | ORAL | 0 refills | Status: DC

## 2016-11-11 NOTE — Addendum Note (Signed)
Addended by: Boykin Nearing on: 11/11/2016 02:11 PM   Modules accepted: Orders

## 2016-11-11 NOTE — Telephone Encounter (Signed)
Patient advised to take naproxen BID with food for next 5-7 days Ice finger as well 20 minutes twice daily

## 2016-11-12 ENCOUNTER — Telehealth: Payer: Self-pay

## 2016-11-12 NOTE — Telephone Encounter (Signed)
Pt was called and informed of medication being sent over to her pharmacy and to ice her finger 2 daily for 20 minutes.

## 2016-11-18 ENCOUNTER — Encounter: Payer: Self-pay | Admitting: Family Medicine

## 2016-11-18 ENCOUNTER — Ambulatory Visit: Payer: Medicare Other | Attending: Family Medicine | Admitting: Family Medicine

## 2016-11-18 VITALS — BP 142/83 | HR 96 | Temp 98.0°F | Ht 64.0 in | Wt 203.4 lb

## 2016-11-18 DIAGNOSIS — F17210 Nicotine dependence, cigarettes, uncomplicated: Secondary | ICD-10-CM | POA: Diagnosis not present

## 2016-11-18 DIAGNOSIS — F319 Bipolar disorder, unspecified: Secondary | ICD-10-CM | POA: Diagnosis not present

## 2016-11-18 DIAGNOSIS — O24414 Gestational diabetes mellitus in pregnancy, insulin controlled: Secondary | ICD-10-CM | POA: Diagnosis not present

## 2016-11-18 DIAGNOSIS — R600 Localized edema: Secondary | ICD-10-CM | POA: Insufficient documentation

## 2016-11-18 DIAGNOSIS — E1165 Type 2 diabetes mellitus with hyperglycemia: Secondary | ICD-10-CM | POA: Diagnosis not present

## 2016-11-18 DIAGNOSIS — S90425D Blister (nonthermal), left lesser toe(s), subsequent encounter: Secondary | ICD-10-CM | POA: Diagnosis not present

## 2016-11-18 DIAGNOSIS — E785 Hyperlipidemia, unspecified: Secondary | ICD-10-CM | POA: Insufficient documentation

## 2016-11-18 DIAGNOSIS — E119 Type 2 diabetes mellitus without complications: Secondary | ICD-10-CM | POA: Diagnosis not present

## 2016-11-18 DIAGNOSIS — S90422D Blister (nonthermal), left great toe, subsequent encounter: Secondary | ICD-10-CM

## 2016-11-18 DIAGNOSIS — Z7982 Long term (current) use of aspirin: Secondary | ICD-10-CM | POA: Diagnosis not present

## 2016-11-18 DIAGNOSIS — Z79899 Other long term (current) drug therapy: Secondary | ICD-10-CM | POA: Insufficient documentation

## 2016-11-18 DIAGNOSIS — I10 Essential (primary) hypertension: Secondary | ICD-10-CM | POA: Diagnosis not present

## 2016-11-18 LAB — GLUCOSE, POCT (MANUAL RESULT ENTRY): POC GLUCOSE: 172 mg/dL — AB (ref 70–99)

## 2016-11-18 LAB — POCT GLYCOSYLATED HEMOGLOBIN (HGB A1C): Hemoglobin A1C: 11.3

## 2016-11-18 MED ORDER — GABAPENTIN 300 MG PO CAPS: ORAL_CAPSULE | ORAL | 3 refills | Status: DC

## 2016-11-18 MED ORDER — 2ND SKIN BLISTER KIT MISC: 1.0000 | Freq: Every day | 0 refills | Status: DC

## 2016-11-18 MED ORDER — LISINOPRIL 40 MG PO TABS: 40.0000 mg | ORAL_TABLET | Freq: Every day | ORAL | 3 refills | Status: DC

## 2016-11-18 NOTE — Patient Instructions (Addendum)
Sarah Long was seen today for diabetes and hypertension.  Diagnoses and all orders for this visit:  Uncontrolled type 2 diabetes mellitus with hyperglycemia, without long-term current use of insulin (HCC) -     POCT glucose (manual entry) -     POCT glycosylated hemoglobin (Hb A1C) -     gabapentin (NEURONTIN) 300 MG capsule; Take by mouth 300 mg in the afternoon and 600 mg at night  HYPERTENSION, BENIGN SYSTEMIC -     lisinopril (PRINIVIL,ZESTRIL) 40 MG tablet; Take 1 tablet (40 mg total) by mouth daily.  Blister of left great toe, subsequent encounter -     Ambulatory referral to Podiatry -     Adhesive Bandages (2ND SKIN BLISTER KIT) MISC; 1 each by Does not apply route daily.  Blister of second toe of left foot, subsequent encounter -     Ambulatory referral to Podiatry -     Adhesive Bandages (2ND SKIN BLISTER KIT) MISC; 1 each by Does not apply route daily.   F/u in 3 weeks for blister check  Dr .Adrian Blackwater

## 2016-11-18 NOTE — Progress Notes (Signed)
Pt is here today to follow up on HTN, and diabetes.

## 2016-11-18 NOTE — Progress Notes (Signed)
Subjective:  Patient ID: Sarah Long, female    DOB: 1971-09-29  Age: 45 y.o. MRN: [id number]  CC: Diabetes and Hypertension   HPI MATTILYN CRITES has diabetes, HTN, bipolar disorder, HLD  presents for    1. CHRONIC DIABETES: started at age 49 with gestational diabetes. Then returned at age 62 with type 2 diabetes.  Disease Monitoring  Blood Sugar Ranges:   Fasting: 157-260  Post prandial: not checking   Polyuria: yes   Visual problems: yes   Medication Compliance: yes  Medication Side Effects  Hypoglycemia: no   Preventitive Health Care  Eye Exam: due   Foot Exam: done today   Diet pattern: not compliant with low sugar   Exercise: yes. Walks and zumba   2. CHRONIC HYPERTENSION: no medications for 4-5 months. Tolerated prinzide without cough. No CP, SOB or swelling.   3. Left 4th finger swelling: persistent. Slight tenderness. Darkening of finger. Stiffness. No fever or chills.   4. Blister of toes: great and second toe of L foot. Painless. She went to ED and was treated with keflex. Having some redness. No swelling. Fever or streaking erythema.    Social History  Substance Use Topics  . Smoking status: Current Every Day Smoker    Packs/day: 0.50    Years: 20.00    Types: Cigarettes  . Smokeless tobacco: Never Used  . Alcohol use No    Outpatient Medications Prior to Visit  Medication Sig Dispense Refill  . amLODipine (NORVASC) 10 MG tablet Take 1 tablet (10 mg total) by mouth daily. 90 tablet 3  . aspirin 81 MG tablet Take 1 tablet (81 mg total) by mouth daily. 90 tablet 3  . Blood Glucose Monitoring Suppl (TRUE METRIX METER) w/Device KIT 1 each by Does not apply route as needed. 1 kit 0  . gabapentin (NEURONTIN) 300 MG capsule Take by mouth,  300 mg at night for first week, then 300 mg twice daily 60 capsule 2  . glucose blood (TRUE METRIX BLOOD GLUCOSE TEST) test strip 1 each by Other route every morning. 90 each 3  . ibuprofen (ADVIL,MOTRIN) 200 MG tablet Take  200 mg by mouth every 6 (six) hours as needed for moderate pain.    . Insulin Glargine (TOUJEO SOLOSTAR) 300 UNIT/ML SOPN Inject 10-45 Units into the skin every morning. Range for dose titration 4.5 mL 5  . Insulin Pen Needle (B-D ULTRAFINE III SHORT PEN) 31G X 8 MM MISC 1 application by Does not apply route daily. 90 each 3  . lisinopril (PRINIVIL,ZESTRIL) 40 MG tablet Take 20 mg (1/2 tablet) for two weeks then 40 mg daily 30 tablet 5  . naproxen (NAPROSYN) 500 MG tablet Take 1 tablet (500 mg total) by mouth 2 (two) times daily with a meal. 30 tablet 0  . Saxagliptin-Metformin (KOMBIGLYZE XR) 2.04-999 MG TB24 Take 2 tablets by mouth daily. 60 tablet 5  . TRUEPLUS LANCETS 28G MISC 1 each by Does not apply route every morning. 90 each 3  . atorvastatin (LIPITOR) 40 MG tablet Take 1 tablet (40 mg total) by mouth daily. (Patient not taking: Reported on 11/18/2016) 90 tablet 3  . diphenhydrAMINE (SOMINEX) 25 MG tablet Take 25 mg by mouth at bedtime as needed for itching, allergies or sleep.    . fluconazole (DIFLUCAN) 150 MG tablet Take 1 tablet (150 mg total) by mouth every 3 (three) days. (Patient not taking: Reported on 11/18/2016) 2 tablet 0   No facility-administered medications prior  to visit.     ROS Review of Systems  Constitutional: Negative for chills and fever.  Eyes: Positive for visual disturbance.  Respiratory: Negative for shortness of breath.   Cardiovascular: Negative for chest pain.  Gastrointestinal: Negative for abdominal pain and blood in stool.  Endocrine: Positive for polydipsia and polyphagia. Negative for polyuria.  Genitourinary: Negative for vaginal discharge.  Musculoskeletal: Positive for joint swelling (swelling in left 4th finger ). Negative for arthralgias and back pain.  Skin: Positive for wound. Negative for rash.  Allergic/Immunologic: Negative for immunocompromised state.  Neurological: Positive for numbness (in feet ).  Hematological: Negative for  adenopathy. Does not bruise/bleed easily.  Psychiatric/Behavioral: Negative for dysphoric mood and suicidal ideas.    Objective:  BP (!) 142/83 (BP Location: Left Arm, Patient Position: Sitting, Cuff Size: Small)   Pulse 96   Temp 98 F (36.7 C) (Oral)   Ht '5\' 4"'$  (1.626 m)   Wt 203 lb 6.4 oz (92.3 kg)   LMP 10/29/2016   SpO2 98%   BMI 34.91 kg/m   BP/Weight 11/18/2016 11/03/2016 41/32/4401  Systolic BP 027 253 664  Diastolic BP 83 73 90  Wt. (Lbs) 203.4 - 205.2  BMI 34.91 - 35.22    Physical Exam  Constitutional: She is oriented to person, place, and time. She appears well-developed and well-nourished. No distress.  HENT:  Head: Normocephalic and atraumatic.  Cardiovascular: Normal rate, regular rhythm, normal heart sounds and intact distal pulses.   Pulmonary/Chest: Effort normal and breath sounds normal.  Musculoskeletal: She exhibits no edema.       Hands:      Feet:  Neurological: She is alert and oriented to person, place, and time.  Skin: Skin is warm and dry. No rash noted.  Psychiatric: She has a normal mood and affect.   Lab Results  Component Value Date   HGBA1C 11.3 11/18/2016   CBG 172    Assessment & Plan:   Mayuri was seen today for diabetes and hypertension.  Diagnoses and all orders for this visit:  Uncontrolled type 2 diabetes mellitus with hyperglycemia, without long-term current use of insulin (HCC) -     POCT glucose (manual entry) -     POCT glycosylated hemoglobin (Hb A1C) -     gabapentin (NEURONTIN) 300 MG capsule; Take by mouth 300 mg in the afternoon and 600 mg at night  HYPERTENSION, BENIGN SYSTEMIC -     lisinopril (PRINIVIL,ZESTRIL) 40 MG tablet; Take 1 tablet (40 mg total) by mouth daily.  Blister of left great toe, subsequent encounter -     Ambulatory referral to Podiatry -     Adhesive Bandages (2ND SKIN BLISTER KIT) MISC; 1 each by Does not apply route daily.  Blister of second toe of left foot, subsequent encounter -      Ambulatory referral to Podiatry -     Adhesive Bandages (2ND SKIN BLISTER KIT) MISC; 1 each by Does not apply route daily.  0 No orders of the defined types were placed in this encounter.   Follow-up: Return in about 3 weeks (around 12/09/2016) for foot blisters .   Boykin Nearing MD

## 2016-11-21 DIAGNOSIS — S90425A Blister (nonthermal), left lesser toe(s), initial encounter: Secondary | ICD-10-CM

## 2016-11-21 HISTORY — DX: Blister (nonthermal), left lesser toe(s), initial encounter: S90.425A

## 2016-11-21 NOTE — Assessment & Plan Note (Signed)
Elevated Med: compliant P: Continue Norvasc 10 mg daily Increase lisinopril to 40 mg daily

## 2016-11-21 NOTE — Assessment & Plan Note (Signed)
Improved with treatment Continue current regimen

## 2016-11-21 NOTE — Assessment & Plan Note (Signed)
Ruptured blister on great and second toe in diabetic patient  She has already completed keflex with no purulent drainage or erythema to suggest infection  Continue wound care Podiatry referral placed

## 2016-11-27 DIAGNOSIS — E109 Type 1 diabetes mellitus without complications: Secondary | ICD-10-CM | POA: Diagnosis not present

## 2016-12-10 ENCOUNTER — Telehealth: Payer: Self-pay | Admitting: Family Medicine

## 2016-12-10 DIAGNOSIS — E1165 Type 2 diabetes mellitus with hyperglycemia: Secondary | ICD-10-CM

## 2016-12-10 NOTE — Telephone Encounter (Signed)
Patient states due to cost of $45 copay for each medication she cannot afford it:   Insulin Glargine (TOUJEO SOLOSTAR Saxagliptin-Metformin (KOMBIGLYZE XR  Please advise

## 2016-12-10 NOTE — Telephone Encounter (Signed)
Patient called stating that she would like to discuss getting her meds changed due to her copay going up and she is unable to pay. Please f/u with patient.

## 2016-12-13 MED ORDER — SITAGLIPTIN PHOS-METFORMIN HCL 50-1000 MG PO TABS: 1.0000 | ORAL_TABLET | Freq: Two times a day (BID) | ORAL | 5 refills | Status: DC

## 2016-12-13 MED ORDER — INSULIN GLARGINE 100 UNIT/ML ~~LOC~~ SOLN: 10.0000 [IU] | Freq: Every day | SUBCUTANEOUS | 11 refills | Status: DC

## 2016-12-13 NOTE — Telephone Encounter (Signed)
Please call patient  Patient advised to replace toujeo with Lantus Replace kombiglyze with janumet   She is to check cost with pharmacy and let us know is still too expensive

## 2016-12-15 NOTE — Telephone Encounter (Signed)
Patient said that lantus was too high and would like to be prescribed something cheaper. Patient was advised to try our pharmacy

## 2016-12-15 NOTE — Telephone Encounter (Signed)
Pt was called and a VM was left informing pt of medication changes, and to call back if new medication is too expensive.

## 2016-12-17 ENCOUNTER — Ambulatory Visit: Payer: Medicare Other | Admitting: Podiatry

## 2016-12-17 MED ORDER — INSULIN GLARGINE 100 UNIT/ML ~~LOC~~ SOLN: 10.0000 [IU] | Freq: Every day | SUBCUTANEOUS | 11 refills | Status: DC

## 2016-12-17 MED ORDER — SITAGLIPTIN PHOS-METFORMIN HCL 50-1000 MG PO TABS: 1.0000 | ORAL_TABLET | Freq: Two times a day (BID) | ORAL | 5 refills | Status: DC

## 2016-12-17 NOTE — Telephone Encounter (Signed)
Please inform patient lantus and janumet sent to onsite pharmacy She is advised to check cost in pharmacy

## 2016-12-20 ENCOUNTER — Ambulatory Visit: Payer: Medicare Other | Admitting: Family Medicine

## 2017-02-26 DIAGNOSIS — E109 Type 1 diabetes mellitus without complications: Secondary | ICD-10-CM | POA: Diagnosis not present

## 2017-03-02 ENCOUNTER — Encounter (HOSPITAL_COMMUNITY): Payer: Self-pay | Admitting: Emergency Medicine

## 2017-03-02 DIAGNOSIS — I10 Essential (primary) hypertension: Secondary | ICD-10-CM | POA: Diagnosis not present

## 2017-03-02 DIAGNOSIS — F17210 Nicotine dependence, cigarettes, uncomplicated: Secondary | ICD-10-CM | POA: Insufficient documentation

## 2017-03-02 DIAGNOSIS — E1165 Type 2 diabetes mellitus with hyperglycemia: Secondary | ICD-10-CM | POA: Diagnosis not present

## 2017-03-02 DIAGNOSIS — Z79899 Other long term (current) drug therapy: Secondary | ICD-10-CM | POA: Diagnosis not present

## 2017-03-02 DIAGNOSIS — Z7982 Long term (current) use of aspirin: Secondary | ICD-10-CM | POA: Insufficient documentation

## 2017-03-02 DIAGNOSIS — R1031 Right lower quadrant pain: Secondary | ICD-10-CM | POA: Diagnosis not present

## 2017-03-02 DIAGNOSIS — Z794 Long term (current) use of insulin: Secondary | ICD-10-CM | POA: Insufficient documentation

## 2017-03-02 DIAGNOSIS — N8311 Corpus luteum cyst of right ovary: Secondary | ICD-10-CM | POA: Insufficient documentation

## 2017-03-02 LAB — URINALYSIS, ROUTINE W REFLEX MICROSCOPIC
BILIRUBIN URINE: NEGATIVE
Glucose, UA: >=500 mg/dL — AB
Ketones, ur: 5 mg/dL — AB
Leukocytes, UA: NEGATIVE
NITRITE: NEGATIVE
PH: 6 (ref 5.0–8.0)
Protein, ur: NEGATIVE mg/dL
SPECIFIC GRAVITY, URINE: 1.024 (ref 1.005–1.030)

## 2017-03-02 LAB — CBC
HEMATOCRIT: 38.7 % (ref 36.0–46.0)
HEMOGLOBIN: 13.1 g/dL (ref 12.0–15.0)
MCH: 30.6 pg (ref 26.0–34.0)
MCHC: 33.9 g/dL (ref 30.0–36.0)
MCV: 90.4 fL (ref 78.0–100.0)
Platelets: 214 10*3/uL (ref 150–400)
RBC: 4.28 MIL/uL (ref 3.87–5.11)
RDW: 12.8 % (ref 11.5–15.5)
WBC: 6.9 10*3/uL (ref 4.0–10.5)

## 2017-03-02 MED ORDER — OXYCODONE-ACETAMINOPHEN 5-325 MG PO TABS
ORAL_TABLET | ORAL | Status: AC
  Filled 2017-03-02: qty 1

## 2017-03-02 MED ORDER — OXYCODONE-ACETAMINOPHEN 5-325 MG PO TABS
1.0000 | ORAL_TABLET | Freq: Once | ORAL | Status: AC
  Administered 2017-03-02: 1 via ORAL

## 2017-03-02 NOTE — ED Triage Notes (Signed)
Patient with sudden onset RLQ/groin area that woke you from sleep this morning. Waves of nausea.

## 2017-03-03 ENCOUNTER — Emergency Department (HOSPITAL_COMMUNITY)
Admission: EM | Admit: 2017-03-03 | Discharge: 2017-03-03 | Disposition: A | Discharge status: Home / Self Care | Payer: Medicare Other | Attending: Emergency Medicine | Admitting: Emergency Medicine

## 2017-03-03 ENCOUNTER — Emergency Department (HOSPITAL_COMMUNITY): Payer: Medicare Other

## 2017-03-03 DIAGNOSIS — R739 Hyperglycemia, unspecified: Secondary | ICD-10-CM

## 2017-03-03 DIAGNOSIS — N831 Corpus luteum cyst of ovary, unspecified side: Secondary | ICD-10-CM

## 2017-03-03 DIAGNOSIS — R1031 Right lower quadrant pain: Secondary | ICD-10-CM | POA: Diagnosis not present

## 2017-03-03 LAB — COMPREHENSIVE METABOLIC PANEL
ALBUMIN: 3.8 g/dL (ref 3.5–5.0)
ALT: 39 U/L (ref 14–54)
AST: 36 U/L (ref 15–41)
Alkaline Phosphatase: 70 U/L (ref 38–126)
Anion gap: 11 (ref 5–15)
BUN: 9 mg/dL (ref 6–20)
CHLORIDE: 99 mmol/L — AB (ref 101–111)
CO2: 22 mmol/L (ref 22–32)
CREATININE: 0.79 mg/dL (ref 0.44–1.00)
Calcium: 9.3 mg/dL (ref 8.9–10.3)
GFR calc non Af Amer: >60 mL/min (ref >60)
GLUCOSE: 435 mg/dL — AB (ref 65–99)
Potassium: 4.2 mmol/L (ref 3.5–5.1)
SODIUM: 132 mmol/L — AB (ref 135–145)
Total Bilirubin: 0.3 mg/dL (ref 0.3–1.2)
Total Protein: 7.3 g/dL (ref 6.5–8.1)

## 2017-03-03 LAB — I-STAT BETA HCG BLOOD, ED (MC, WL, AP ONLY)

## 2017-03-03 LAB — LIPASE, BLOOD: LIPASE: 48 U/L (ref 11–51)

## 2017-03-03 LAB — CBG MONITORING, ED: Glucose-Capillary: 405 mg/dL — ABNORMAL HIGH (ref 65–99)

## 2017-03-03 MED ORDER — IOPAMIDOL (ISOVUE-300) INJECTION 61%
INTRAVENOUS | Status: AC
  Administered 2017-03-03: 100 mL via INTRAVENOUS
  Filled 2017-03-03: qty 100

## 2017-03-03 MED ORDER — SODIUM CHLORIDE 0.9 % IV BOLUS (SEPSIS)
1000.0000 mL | Freq: Once | INTRAVENOUS | Status: AC
  Administered 2017-03-03: 1000 mL via INTRAVENOUS

## 2017-03-03 MED ORDER — ONDANSETRON HCL 4 MG/2ML IJ SOLN
4.0000 mg | Freq: Once | INTRAMUSCULAR | Status: AC
Start: 2017-03-03 — End: 2017-03-03
  Administered 2017-03-03: 4 mg via INTRAVENOUS
  Filled 2017-03-03: qty 2

## 2017-03-03 MED ORDER — ONDANSETRON HCL 4 MG/2ML IJ SOLN
4.0000 mg | Freq: Once | INTRAMUSCULAR | Status: AC
  Administered 2017-03-03: 4 mg via INTRAVENOUS
  Filled 2017-03-03: qty 2

## 2017-03-03 MED ORDER — FENTANYL CITRATE (PF) 100 MCG/2ML IJ SOLN
50.0000 ug | Freq: Once | INTRAMUSCULAR | Status: AC
  Administered 2017-03-03: 50 ug via INTRAVENOUS
  Filled 2017-03-03: qty 2

## 2017-03-03 NOTE — ED Notes (Signed)
Nurse first rounds: nurse explained delay , process and census , pt. Resting with no distress,respirations unlabored.

## 2017-03-03 NOTE — ED Notes (Signed)
Pt complains of right lower abd/groin pain for the past 2 days. Pt denies pain to the back or flank area.

## 2017-03-03 NOTE — Discharge Instructions (Signed)

## 2017-03-03 NOTE — ED Provider Notes (Signed)
[facility] DEPT Provider Note   CSN: [id number] Arrival date & time: 03/02/17  2216     History   Chief Complaint Chief Complaint  Patient presents with  . Abdominal Pain    right groin area    HPI Sarah Long is a 46 y.o. female.  The history is provided by the patient.  Abdominal Pain   This is a new problem. The current episode started yesterday. The problem occurs constantly. The problem has been gradually worsening. The pain is located in the RLQ. The pain is moderate. Associated symptoms include nausea and vomiting. Pertinent negatives include fever, diarrhea and dysuria. The symptoms are aggravated by palpation. Nothing relieves the symptoms.  Patient presents with RLQ pain over past day (24 hrs) She reports nausea/vomiting No fever No diarrhea No dysuria No vaginal bleeding She does not recall having this pain previously  Past Medical History:  Diagnosis Date  . Abnormal Pap smear 1998  . Abnormal vaginal bleeding 12/19/2011  . Bacterial infection   . Bipolar 1 disorder ([place])   . Candida vaginitis 07/2007  . Depression    recently added wellbutrin-has not taken yet for bipolar  . Diabetes in pregnancy   . Diabetes mellitus    nph 20U qam and qpm, regular with meals  . Fibroid   . Galactorrhea of right breast 2008  . H/O amenorrhea 07/2007  . H/O dizziness 10/14/11  . H/O dysmenorrhea 2010  . H/O menorrhagia 10/14/11  . H/O varicella   . Headache(784.0)   . Heavy vaginal bleeding due to contraceptive injection use 10/12/2011   Depo Provera  . Herpes   . HSV-2 infection 01/03/2009  . Hx: UTI (urinary tract infection) 2009  . Hypertension    on aldomet  . Increased BMI 2010  . Obesity 10/14/11  . Oligomenorrhea 07/2007  . Pelvic pain in female   . Preterm labor   . Trichomonas   . Yeast infection     Patient Active Problem List   Diagnosis Date Noted  . Blister of second toe of left foot 11/21/2016  . Diabetes type 2, uncontrolled ([place]) 10/21/2016   . Vaginal yeast infection 10/21/2016  . Swelling of left ring finger 10/21/2016  . Smoking 11/26/2013  . Irregular uterine bleeding 04/04/2012  . HYPERLIPIDEMIA 02/02/2007  . BIPOLAR DISORDER 02/02/2007  . HYPERTENSION, BENIGN SYSTEMIC 02/02/2007    Past Surgical History:  Procedure Laterality Date  . CESAREAN SECTION  1991    OB History    Gravida Para Term Preterm AB Living   _0 SAB TAB Ectopic Multiple Live Births   1 1 0 0 3       Home Medications    Prior to Admission medications   Medication Sig Start Date End Date Taking? Authorizing Provider  Adhesive Bandages (2ND SKIN BLISTER KIT) MISC 1 each by Does not apply route daily. 11/18/16   Josalyn Funches, MD  amLODipine (NORVASC) 10 MG tablet Take 1 tablet (10 mg total) by mouth daily. 10/21/16   Boykin Nearing, MD  aspirin 81 MG tablet Take 1 tablet (81 mg total) by mouth daily. 10/22/16   Josalyn Funches, MD  atorvastatin (LIPITOR) 40 MG tablet Take 1 tablet (40 mg total) by mouth daily. Patient not taking: Reported on 11/18/2016 10/22/16   Boykin Nearing, MD  Blood Glucose Monitoring Suppl (TRUE METRIX METER) w/Device KIT 1 each by Does not apply route as needed. 10/21/16   Boykin Nearing, MD  diphenhydrAMINE (SOMINEX) 25 MG tablet Take 25 mg by mouth at bedtime as needed for itching, allergies or sleep.    Historical Provider, MD  gabapentin (NEURONTIN) 300 MG capsule Take by mouth,  300 mg at night for first week, then 300 mg twice daily 10/21/16   Boykin Nearing, MD  gabapentin (NEURONTIN) 300 MG capsule Take by mouth 300 mg in the afternoon and 600 mg at night 11/18/16   Josalyn Funches, MD  glucose blood (TRUE METRIX BLOOD GLUCOSE TEST) test strip 1 each by Other route every morning. 10/21/16   Josalyn Funches, MD  ibuprofen (ADVIL,MOTRIN) 200 MG tablet Take 200 mg by mouth every 6 (six) hours as needed for moderate pain.    Historical Provider, MD  insulin glargine (LANTUS) 100 UNIT/ML injection  Inject 0.1 mLs (10 Units total) into the skin at bedtime. 12/17/16   Josalyn Funches, MD  Insulin Pen Needle (B-D ULTRAFINE III SHORT PEN) 31G X 8 MM MISC 1 application by Does not apply route daily. 10/21/16   Josalyn Funches, MD  lisinopril (PRINIVIL,ZESTRIL) 40 MG tablet Take 1 tablet (40 mg total) by mouth daily. 11/18/16   Josalyn Funches, MD  naproxen (NAPROSYN) 500 MG tablet Take 1 tablet (500 mg total) by mouth 2 (two) times daily with a meal. 11/11/16   Boykin Nearing, MD  sitaGLIPtin-metformin (JANUMET) 50-1000 MG tablet Take 1 tablet by mouth 2 (two) times daily with a meal. 12/17/16   Josalyn Funches, MD  TRUEPLUS LANCETS 28G MISC 1 each by Does not apply route every morning. 10/21/16   Boykin Nearing, MD    Family History Family History  Problem Relation Age of Onset  . Hypertension Mother   . Diabetes Mother   . Heart disease Mother   . Hypertension Father   . Diabetes Father   . Heart disease Father   . Stroke Maternal Grandfather   . Other Neg Hx     Social History Social History  Substance Use Topics  . Smoking status: Current Every Day Smoker    Packs/day: 0.50    Years: 20.00    Types: Cigarettes  . Smokeless tobacco: Never Used  . Alcohol use No     Allergies   Strawberry extract and Sulfa antibiotics   Review of Systems Review of Systems  Constitutional: Negative for fever.  Respiratory: Positive for cough.   Gastrointestinal: Positive for abdominal pain, nausea and vomiting. Negative for diarrhea.  Genitourinary: Negative for dysuria and vaginal bleeding.  All other systems reviewed and are negative.    Physical Exam Updated Vital Signs BP (!) 164/85 (BP Location: Left Arm)   Pulse 96   Temp 98.1 F (36.7 C) (Oral)   Resp 18   Ht _0  (1.626 m)   Wt 90.7 kg   SpO2 99%   BMI 34.33 kg/m   Physical Exam CONSTITUTIONAL: Well developed/well nourished HEAD: Normocephalic/atraumatic EYES: EOMI/PERRL, no icterus ENMT: Mucous membranes  moist NECK: supple no meningeal signs SPINE/BACK:entire spine nontender CV: S1/S2 noted, no murmurs/rubs/gallops noted LUNGS: Lungs are clear to auscultation bilaterally, no apparent distress ABDOMEN: soft, moderate RLQ, no rebound or guarding, bowel sounds noted throughout abdomen GU:no cva tenderness NEURO: Pt is awake/alert/appropriate, moves all extremitiesx4.  No facial droop.   EXTREMITIES: pulses normal/equal, full ROM SKIN: warm, color normal PSYCH: no abnormalities of mood noted, alert and oriented to situation   ED Treatments / Results  Labs (all labs ordered are listed, but only abnormal results are displayed) Labs Reviewed  COMPREHENSIVE  METABOLIC PANEL - Abnormal; Notable for the following:       Result Value   Sodium 132 (*)    Chloride 99 (*)    Glucose, Bld 435 (*)    All other components within normal limits  URINALYSIS, ROUTINE W REFLEX MICROSCOPIC - Abnormal; Notable for the following:    Color, Urine STRAW (*)    Glucose, UA >=500 (*)    Hgb urine dipstick SMALL (*)    Ketones, ur 5 (*)    Bacteria, UA RARE (*)    Squamous Epithelial / LPF 0-5 (*)    All other components within normal limits  CBG MONITORING, ED - Abnormal; Notable for the following:    Glucose-Capillary 405 (*)    All other components within normal limits  LIPASE, BLOOD  CBC  I-STAT BETA HCG BLOOD, ED ([facility], [facility], [facility] ONLY)    EKG  EKG Interpretation None       Radiology Ct Abdomen Pelvis W Contrast  Result Date: 03/03/2017 CLINICAL DATA:  Right lower quadrant pain. EXAM: CT ABDOMEN AND PELVIS WITH CONTRAST TECHNIQUE: Multidetector CT imaging of the abdomen and pelvis was performed using the standard protocol following bolus administration of intravenous contrast. CONTRAST:  100 cc Isovue-300 intravenous COMPARISON:  None FINDINGS: Lower chest:  No contributory findings. Hepatobiliary: Prominent hepatic steatosis. No focal lesion noted.No evidence of biliary obstruction or stone.  Pancreas: Unremarkable. Spleen: Unremarkable. Adrenals/Urinary Tract: Negative adrenals. No hydronephrosis or stone. Unremarkable bladder. Stomach/Bowel:  No obstruction. No appendicitis. Vascular/Lymphatic: No acute vascular abnormality. No mass or adenopathy. Reproductive:Corpus luteum on the right. Other: No ascites or pneumoperitoneum. Musculoskeletal: No acute abnormalities. IMPRESSION: 1. No acute finding.  Normal appendix. 2. Corpus luteum on the right. 3. Hepatic steatosis. Electronically Signed   By: Monte Fantasia M.D.   On: 03/03/2017 08:04    Procedures Procedures (including critical care time)  Medications Ordered in ED Medications  iopamidol (ISOVUE-300) 61 % injection (not administered)  ondansetron (ZOFRAN) injection 4 mg (not administered)  oxyCODONE-acetaminophen (PERCOCET/ROXICET) 5-325 MG per tablet 1 tablet (1 tablet Oral Given 03/02/17 2335)  sodium chloride 0.9 % bolus 1,000 mL (0 mLs Intravenous Stopped 03/03/17 0644)  fentaNYL (SUBLIMAZE) injection 50 mcg (50 mcg Intravenous Given 03/03/17 0550)  ondansetron (ZOFRAN) injection 4 mg (4 mg Intravenous Given 03/03/17 0550)     Initial Impression / Assessment and Plan / ED Course  I have reviewed the triage vital signs and the nursing notes.  Pertinent labs results that were available during my care of the patient were reviewed by me and considered in my medical decision making (see chart for details).     6:47 AM Pt with worsening RLQ pain over past 24 hours with nausea/vomiting Possibilities include  Ureteral stone (she has hematuria) but also potentially appendicitis CT imaging ordered IV fluids ordered   Pt improved CT negative for appendicitis She has corpeus luteum cyst which could explain pain She is well appearing No distress Informed her of hyperglycemia D/c home Final Clinical Impressions(s) / ED Diagnoses   Final diagnoses:  Hyperglycemia  Corpus luteum cyst    New Prescriptions New  Prescriptions   No medications on file     Ripley Fraise, MD 03/03/17 0623

## 2017-03-03 NOTE — ED Notes (Signed)
Patient transported to CT 

## 2017-03-07 ENCOUNTER — Encounter (INDEPENDENT_AMBULATORY_CARE_PROVIDER_SITE_OTHER): Payer: Self-pay | Admitting: Physician Assistant

## 2017-03-07 ENCOUNTER — Ambulatory Visit (INDEPENDENT_AMBULATORY_CARE_PROVIDER_SITE_OTHER): Payer: Medicare Other | Admitting: Physician Assistant

## 2017-03-07 VITALS — BP 140/84 | HR 98 | Temp 98.6°F | Resp 18 | Ht 65.0 in | Wt 205.0 lb

## 2017-03-07 DIAGNOSIS — B379 Candidiasis, unspecified: Secondary | ICD-10-CM | POA: Diagnosis not present

## 2017-03-07 DIAGNOSIS — Z794 Long term (current) use of insulin: Secondary | ICD-10-CM

## 2017-03-07 DIAGNOSIS — E1142 Type 2 diabetes mellitus with diabetic polyneuropathy: Secondary | ICD-10-CM | POA: Diagnosis not present

## 2017-03-07 DIAGNOSIS — E7800 Pure hypercholesterolemia, unspecified: Secondary | ICD-10-CM

## 2017-03-07 DIAGNOSIS — I10 Essential (primary) hypertension: Secondary | ICD-10-CM | POA: Diagnosis not present

## 2017-03-07 DIAGNOSIS — E1165 Type 2 diabetes mellitus with hyperglycemia: Secondary | ICD-10-CM | POA: Diagnosis not present

## 2017-03-07 LAB — POCT URINALYSIS DIPSTICK
BILIRUBIN UA: NEGATIVE
Ketones, UA: NEGATIVE
Leukocytes, UA: NEGATIVE
Nitrite, UA: NEGATIVE
Spec Grav, UA: 1.015 (ref 1.030–1.035)
Urobilinogen, UA: 0.2 (ref ?–2.0)
pH, UA: 5 (ref 5.0–8.0)

## 2017-03-07 LAB — POCT GLYCOSYLATED HEMOGLOBIN (HGB A1C): Hemoglobin A1C: 12

## 2017-03-07 LAB — GLUCOSE, POCT (MANUAL RESULT ENTRY)
POC GLUCOSE: 299 mg/dL — AB (ref 70–99)
POC Glucose: 337 mg/dl — AB (ref 70–99)

## 2017-03-07 MED ORDER — HYDROCHLOROTHIAZIDE 12.5 MG PO TABS: 12.5000 mg | ORAL_TABLET | Freq: Every day | ORAL | 1 refills | Status: DC

## 2017-03-07 MED ORDER — ATORVASTATIN CALCIUM 40 MG PO TABS: 40.0000 mg | ORAL_TABLET | Freq: Every day | ORAL | 3 refills | Status: DC

## 2017-03-07 MED ORDER — METFORMIN HCL 1000 MG PO TABS: 1000.0000 mg | ORAL_TABLET | Freq: Two times a day (BID) | ORAL | 3 refills | Status: DC

## 2017-03-07 MED ORDER — INSULIN ASPART 100 UNIT/ML ~~LOC~~ SOLN
10.0000 [IU] | Freq: Once | SUBCUTANEOUS | Status: AC
  Administered 2017-03-07: 10 [IU] via SUBCUTANEOUS

## 2017-03-07 MED ORDER — ASPIRIN 81 MG PO TABS: 81.0000 mg | ORAL_TABLET | Freq: Every day | ORAL | 3 refills | Status: DC

## 2017-03-07 MED ORDER — INSULIN PEN NEEDLE 31G X 8 MM MISC
1.0000 | Freq: Every day | 3 refills | Status: DC
Start: 2017-03-07 — End: 2018-10-17

## 2017-03-07 MED ORDER — TRUE METRIX METER W/DEVICE KIT: 1.0000 | PACK | 0 refills | Status: DC | PRN

## 2017-03-07 MED ORDER — GLUCOSE BLOOD VI STRP: 1.0000 | ORAL_STRIP | 3 refills | Status: DC

## 2017-03-07 MED ORDER — INSULIN GLARGINE 100 UNIT/ML ~~LOC~~ SOLN: 60.0000 [IU] | Freq: Every day | SUBCUTANEOUS | 11 refills | Status: DC

## 2017-03-07 MED ORDER — INSULIN GLARGINE 100 UNIT/ML ~~LOC~~ SOLN: 60.0000 [IU] | Freq: Every day | SUBCUTANEOUS | 5 refills | Status: DC

## 2017-03-07 MED ORDER — CLONIDINE HCL 0.2 MG PO TABS
0.2000 mg | ORAL_TABLET | Freq: Once | ORAL | Status: AC
  Administered 2017-03-07: 0.2 mg via ORAL

## 2017-03-07 MED ORDER — LISINOPRIL 40 MG PO TABS: 40.0000 mg | ORAL_TABLET | Freq: Every day | ORAL | 1 refills | Status: DC

## 2017-03-07 MED ORDER — FLUCONAZOLE 150 MG PO TABS: 150.0000 mg | ORAL_TABLET | ORAL | 0 refills | Status: DC

## 2017-03-07 MED ORDER — AMLODIPINE BESYLATE 10 MG PO TABS
10.0000 mg | ORAL_TABLET | Freq: Every day | ORAL | 3 refills | Status: DC
Start: 2017-03-07 — End: 2018-10-17

## 2017-03-07 MED FILL — LANTUS 100 UNITS/ML VIAL: 100 | 16 days supply | Qty: 10 | Fill #0

## 2017-03-07 MED FILL — FLUCONAZOLE 150 MG TABLET: 150 | 9 days supply | Qty: 3 | Fill #0

## 2017-03-07 MED FILL — ATORVASTATIN 40 MG TABLET: 40 | 30 days supply | Qty: 30 | Fill #0

## 2017-03-07 MED FILL — AMLODIPINE BESYLATE 10 MG T: 10 | 30 days supply | Qty: 30 | Fill #0

## 2017-03-07 MED FILL — LISINOPRIL 40 MG TABLET: 40 | 30 days supply | Qty: 30 | Fill #0

## 2017-03-07 MED FILL — metFORMIN HCL 1000 MG TABS: 1000 | 30 days supply | Qty: 60 | Fill #0

## 2017-03-07 MED FILL — HYDROCHLOROTHIAZIDE 12.5 MG: 12.5 | 30 days supply | Qty: 30 | Fill #0

## 2017-03-07 NOTE — Patient Instructions (Signed)
Diabetes Mellitus and Exercise Exercising regularly is important for your overall health, especially when you have diabetes (diabetes mellitus). Exercising is not only about losing weight. It has many health benefits, such as increasing muscle strength and bone density and reducing body fat and stress. This leads to improved fitness, flexibility, and endurance, all of which result in better overall health. Exercise has additional benefits for people with diabetes, including:  Reducing appetite.  Helping to lower and control blood glucose.  Lowering blood pressure.  Helping to control amounts of fatty substances (lipids) in the blood, such as cholesterol and triglycerides.  Helping the body to respond better to insulin (improving insulin sensitivity).  Reducing how much insulin the body needs.  Decreasing the risk for heart disease by:  Lowering cholesterol and triglyceride levels.  Increasing the levels of good cholesterol.  Lowering blood glucose levels. What is my activity plan? Your health care provider or certified diabetes educator can help you make a plan for the type and frequency of exercise (activity plan) that works for you. Make sure that you:  Do at least 150 minutes of moderate-intensity or vigorous-intensity exercise each week. This could be brisk walking, biking, or water aerobics.  Do stretching and strength exercises, such as yoga or weightlifting, at least 2 times a week.  Spread out your activity over at least 3 days of the week.  Get some form of physical activity every day.  Do not go more than 2 days in a row without some kind of physical activity.  Avoid being inactive for more than 90 minutes at a time. Take frequent breaks to walk or stretch.  Choose a type of exercise or activity that you enjoy, and set realistic goals.  Start slowly, and gradually increase the intensity of your exercise over time. What do I need to know about managing my  diabetes?  Check your blood glucose before and after exercising.  If your blood glucose is higher than 240 mg/dL (13.3 mmol/L) before you exercise, check your urine for ketones. If you have ketones in your urine, do not exercise until your blood glucose returns to normal.  Know the symptoms of low blood glucose (hypoglycemia) and how to treat it. Your risk for hypoglycemia increases during and after exercise. Common symptoms of hypoglycemia can include:  Hunger.  Anxiety.  Sweating and feeling clammy.  Confusion.  Dizziness or feeling light-headed.  Increased heart rate or palpitations.  Blurry vision.  Tingling or numbness around the mouth, lips, or tongue.  Tremors or shakes.  Irritability.  Keep a rapid-acting carbohydrate snack available before, during, and after exercise to help prevent or treat hypoglycemia.  Avoid injecting insulin into areas of the body that are going to be exercised. For example, avoid injecting insulin into:  The arms, when playing tennis.  The legs, when jogging.  Keep records of your exercise habits. Doing this can help you and your health care provider adjust your diabetes management plan as needed. Write down:  Food that you eat before and after you exercise.  Blood glucose levels before and after you exercise.  The type and amount of exercise you have done.  When your insulin is expected to peak, if you use insulin. Avoid exercising at times when your insulin is peaking.  When you start a new exercise or activity, work with your health care provider to make sure the activity is safe for you, and to adjust your insulin, medicines, or food intake as needed.  Drink plenty   of water while you exercise to prevent dehydration or heat stroke. Drink enough fluid to keep your urine clear or pale yellow. This information is not intended to replace advice given to you by your health care provider. Make sure you discuss any questions you have with  your health care provider. Document Released: 02/12/2004 Document Revised: 06/11/2016 Document Reviewed: 05/03/2016 Elsevier Interactive Patient Education  2017 [employer]  

## 2017-03-07 NOTE — Progress Notes (Signed)
Subjective:  Patient ID: Sarah Long, female    DOB: 1971/10/12  Age: 46 y.o. MRN: [id number]  CC:  diabetes  HPI Sarah Long is a 46 y.o. female with a PMH of DM2, HTN, HSV2, and depression presents to establish care. Symptoms include polydipsia, polyuria, polyphagia, tingling, numbness, visual blurring. Diabetic retinopathy screening reportedly done 3 months ago with no signs of diabetic retinopathy. Has chronic RLQ pain but this is attributed to ovarian cyst. Abdominal pain at baseline. Was found to have a corpus luteum cyst at the ED on 03/03/17. Thinks she is having a yeast infection due to thick, clumpy vaginal discharge with associated itching and burning. Has not taken her antihypertensives or antiglycemics since December 2017. Denies chest pain, palpitations, SOB, HA, acute abdominal pain, f/c/n/v, rash, or GI symptoms.      Review of Systems  Constitutional: Negative for chills, fever and malaise/fatigue.  Eyes: Negative for blurred vision.  Respiratory: Negative for shortness of breath.   Cardiovascular: Negative for chest pain and palpitations.  Gastrointestinal: Positive for abdominal pain (chronic right pelvic pain). Negative for nausea.  Genitourinary: Negative for dysuria and hematuria.  Musculoskeletal: Negative for joint pain and myalgias.  Skin: Negative for rash.  Neurological: Negative for tingling and headaches.  Endo/Heme/Allergies: Positive for polydipsia.  Psychiatric/Behavioral: Negative for depression. The patient is not nervous/anxious.     Objective:  BP 140/84   Pulse 98   Temp 98.6 F (37 C) (Oral)   Resp 18   Ht _0  (1.651 m)   Wt 205 lb (93 kg)   LMP 02/05/2017 (Approximate)   SpO2 100%   BMI 34.11 kg/m   BP/Weight 03/07/2017 03/03/2017 9/56/2130  Systolic BP 865 784 -  Diastolic BP 84 81 -  Wt. (Lbs) 205 - 200  BMI 34.11 34.33 -      Physical Exam  Constitutional: She is oriented to person, place, and time.  Well developed, obese,  NAD, polite  HENT:  Head: Normocephalic and atraumatic.  Eyes: No scleral icterus.  Neck: Normal range of motion. Neck supple. No thyromegaly present.  Cardiovascular: Normal rate, regular rhythm and normal heart sounds.   Pulmonary/Chest: Effort normal and breath sounds normal.  Abdominal: Soft. Bowel sounds are normal. There is tenderness (mild right sided pelvic TTP).  Musculoskeletal: She exhibits no edema.  Neurological: She is alert and oriented to person, place, and time.  Skin: Skin is warm and dry. No rash noted. No erythema. No pallor.  Psychiatric: She has a normal mood and affect. Her behavior is normal. Thought content normal.  Vitals reviewed.    Assessment & Plan:   1. Type 2 diabetes mellitus with diabetic polyneuropathy, with long-term current use of insulin (HCC) - HgB A1c 12.0 in clinic today - Glucose (CBG) 337 and 299 - Microalbumin/Creatinine Ratio, Urine - Urinalysis Dipstick >1000 glucose - Given insulin aspart (novoLOG) injection 10 Units; Inject 0.1 mLs (10 Units total) into the skin once. - metFORMIN (GLUCOPHAGE) 1000 MG tablet; Take 1 tablet (1,000 mg total) by mouth 2 (two) times daily with a meal.  Dispense: 180 tablet; Refill: 3 - Blood Glucose Monitoring Suppl (TRUE METRIX METER) w/Device KIT; 1 each by Does not apply route as needed.  Dispense: 1 kit; Refill: 0 - glucose blood (TRUE METRIX BLOOD GLUCOSE TEST) test strip; 1 each by Other route every morning.  Dispense: 90 each; Refill: 3 - Insulin Pen Needle (B-D ULTRAFINE III SHORT PEN) 31G X 8 MM MISC; 1 application by  Does not apply route daily.  Dispense: 90 each; Refill: 3 - insulin glargine (LANTUS) 100 UNIT/ML injection; Inject 0.6 mLs (60 Units total) into the skin at bedtime.  Dispense: 18 mL; Refill: 5 - I have personally explained to start at 35 units tonight of Lantus and to go up by one unit every other day until reaching 60 units qhs. She is to measure her blood glucose three times per day  and take UP TO 60 units but may stop at a lower dose if glucometer readings are around 150 regularly.  2. Hypertension, unspecified type - amLODipine (NORVASC) 10 MG tablet; Take 1 tablet (10 mg total) by mouth daily.  Dispense: 90 tablet; Refill: 3 - aspirin 81 MG tablet; Take 1 tablet (81 mg total) by mouth daily.  Dispense: 90 tablet; Refill: 3 - lisinopril (PRINIVIL,ZESTRIL) 40 MG tablet; Take 1 tablet (40 mg total) by mouth daily.  Dispense: 90 tablet; Refill: 1 - cloNIDine (CATAPRES) tablet 0.2 mg; Take 1 tablet (0.2 mg total) by mouth once. - hydrochlorothiazide (HYDRODIURIL) 12.5 MG tablet; Take 1 tablet (12.5 mg total) by mouth daily.  Dispense: 90 tablet; Refill: 1 - CBC with Differential/Platelet; Future - Comprehensive metabolic panel; Future - TSH; Future  3. Yeast infection - fluconazole (DIFLUCAN) 150 MG tablet; Take 1 tablet (150 mg total) by mouth every 3 (three) days.  Dispense: 3 tablet; Refill: 0  4. Pure hypercholesterolemia - atorvastatin (LIPITOR) 40 MG tablet; Take 1 tablet (40 mg total) by mouth daily.  Dispense: 90 tablet; Refill: 3 - Lipid panel; Future   Meds ordered this encounter  Medications  . insulin aspart (novoLOG) injection 10 Units  . amLODipine (NORVASC) 10 MG tablet    Sig: Take 1 tablet (10 mg total) by mouth daily.    Dispense:  90 tablet    Refill:  3    Order Specific Question:   Supervising Provider    Answer:   Tresa Garter [id number]  . aspirin 81 MG tablet    Sig: Take 1 tablet (81 mg total) by mouth daily.    Dispense:  90 tablet    Refill:  3    Order Specific Question:   Supervising Provider    Answer:   Tresa Garter [id number]  . atorvastatin (LIPITOR) 40 MG tablet    Sig: Take 1 tablet (40 mg total) by mouth daily.    Dispense:  90 tablet    Refill:  3    Order Specific Question:   Supervising Provider    Answer:   Tresa Garter [id number]  . DISCONTD: insulin glargine (LANTUS) 100 UNIT/ML injection     Sig: Inject 0.6 mLs (60 Units total) into the skin at bedtime.    Dispense:  10 mL    Refill:  11    Dc TOUJEO    Order Specific Question:   Supervising Provider    Answer:   Tresa Garter [id number]  . lisinopril (PRINIVIL,ZESTRIL) 40 MG tablet    Sig: Take 1 tablet (40 mg total) by mouth daily.    Dispense:  90 tablet    Refill:  1    Order Specific Question:   Supervising Provider    Answer:   Tresa Garter [id number]  . metFORMIN (GLUCOPHAGE) 1000 MG tablet    Sig: Take 1 tablet (1,000 mg total) by mouth 2 (two) times daily with a meal.    Dispense:  180 tablet    Refill:  3  Order Specific Question:   Supervising Provider    Answer:   Tresa Garter [id number]  . Blood Glucose Monitoring Suppl (TRUE METRIX METER) w/Device KIT    Sig: 1 each by Does not apply route as needed.    Dispense:  1 kit    Refill:  0    Order Specific Question:   Supervising Provider    Answer:   Tresa Garter [id number]  . glucose blood (TRUE METRIX BLOOD GLUCOSE TEST) test strip    Sig: 1 each by Other route every morning.    Dispense:  90 each    Refill:  3    Order Specific Question:   Supervising Provider    Answer:   Tresa Garter [id number]  . Insulin Pen Needle (B-D ULTRAFINE III SHORT PEN) 31G X 8 MM MISC    Sig: 1 application by Does not apply route daily.    Dispense:  90 each    Refill:  3    Order Specific Question:   Supervising Provider    Answer:   Tresa Garter [id number]  . cloNIDine (CATAPRES) tablet 0.2 mg  . fluconazole (DIFLUCAN) 150 MG tablet    Sig: Take 1 tablet (150 mg total) by mouth every 3 (three) days.    Dispense:  3 tablet    Refill:  0    Order Specific Question:   Supervising Provider    Answer:   Tresa Garter [id number]  . insulin glargine (LANTUS) 100 UNIT/ML injection    Sig: Inject 0.6 mLs (60 Units total) into the skin at bedtime.    Dispense:  18 mL    Refill:  5    Dc TOUJEO    Order Specific Question:    Supervising Provider    Answer:   Tresa Garter [id number]  . hydrochlorothiazide (HYDRODIURIL) 12.5 MG tablet    Sig: Take 1 tablet (12.5 mg total) by mouth daily.    Dispense:  90 tablet    Refill:  1    Order Specific Question:   Supervising Provider    Answer:   Tresa Garter [id number]    Follow-up: Return in about 4 weeks (around 04/04/2017) for DM and HTN.   Clent Demark PA

## 2017-03-07 NOTE — Progress Notes (Signed)
Patient is here for ED FU Cyst  Patient complains of right side pain being present.  Patient has not taken medication today. Patient has eaten today.

## 2017-03-08 ENCOUNTER — Other Ambulatory Visit: Payer: Self-pay | Admitting: Pharmacist

## 2017-03-08 LAB — MICROALBUMIN / CREATININE URINE RATIO
Creatinine, Urine: 51 mg/dL
MICROALB/CREAT RATIO: 149.6 mg/g{creat} — AB (ref 0.0–30.0)
MICROALBUM., U, RANDOM: 76.3 ug/mL

## 2017-03-08 MED ORDER — "INSULIN SYRINGE-NEEDLE U-100 31G X 5/16"" 1 ML MISC": 12 refills | Status: DC

## 2017-03-08 MED FILL — TRUEPLUS SYR 1ML 31GX5/16: 31G X 5/16" | 25 days supply | Qty: 100 | Fill #0

## 2017-03-08 MED FILL — TRUEPLUS SYR 1ML 31GX5/16": 31G X 5/16" | 25 days supply | Qty: 100 | Fill #0

## 2017-03-17 ENCOUNTER — Other Ambulatory Visit (INDEPENDENT_AMBULATORY_CARE_PROVIDER_SITE_OTHER): Payer: Medicare Other

## 2017-03-17 ENCOUNTER — Other Ambulatory Visit (INDEPENDENT_AMBULATORY_CARE_PROVIDER_SITE_OTHER): Payer: Self-pay | Admitting: Physician Assistant

## 2017-03-17 DIAGNOSIS — E1142 Type 2 diabetes mellitus with diabetic polyneuropathy: Secondary | ICD-10-CM

## 2017-03-17 DIAGNOSIS — I10 Essential (primary) hypertension: Secondary | ICD-10-CM | POA: Diagnosis not present

## 2017-03-18 LAB — CBC WITH DIFFERENTIAL/PLATELET
Basophils Absolute: 0 10*3/uL (ref 0.0–0.2)
Basos: 0 %
EOS (ABSOLUTE): 0.1 10*3/uL (ref 0.0–0.4)
EOS: 2 %
HEMATOCRIT: 40.5 % (ref 34.0–46.6)
Hemoglobin: 13.2 g/dL (ref 11.1–15.9)
IMMATURE GRANULOCYTES: 0 %
Immature Grans (Abs): 0 10*3/uL (ref 0.0–0.1)
Lymphocytes Absolute: 2.1 10*3/uL (ref 0.7–3.1)
Lymphs: 28 %
MCH: 30.8 pg (ref 26.6–33.0)
MCHC: 32.6 g/dL (ref 31.5–35.7)
MCV: 95 fL (ref 79–97)
MONOS ABS: 0.4 10*3/uL (ref 0.1–0.9)
Monocytes: 5 %
NEUTROS PCT: 65 %
Neutrophils Absolute: 4.9 10*3/uL (ref 1.4–7.0)
PLATELETS: 290 10*3/uL (ref 150–379)
RBC: 4.28 x10E6/uL (ref 3.77–5.28)
RDW: 13.9 % (ref 12.3–15.4)
WBC: 7.5 10*3/uL (ref 3.4–10.8)

## 2017-03-18 LAB — COMPREHENSIVE METABOLIC PANEL
ALT: 55 IU/L — AB (ref 0–32)
AST: 46 IU/L — AB (ref 0–40)
Albumin/Globulin Ratio: 1.7 (ref 1.2–2.2)
Albumin: 4.5 g/dL (ref 3.5–5.5)
Alkaline Phosphatase: 88 IU/L (ref 39–117)
BUN/Creatinine Ratio: 20 (ref 9–23)
BUN: 15 mg/dL (ref 6–24)
Bilirubin Total: <0.2 mg/dL (ref 0.0–1.2)
CALCIUM: 9.6 mg/dL (ref 8.7–10.2)
CO2: 24 mmol/L (ref 18–29)
CREATININE: 0.76 mg/dL (ref 0.57–1.00)
Chloride: 98 mmol/L (ref 96–106)
GFR calc Af Amer: 111 mL/min/{1.73_m2} (ref >59)
GFR, EST NON AFRICAN AMERICAN: 96 mL/min/{1.73_m2} (ref >59)
GLOBULIN, TOTAL: 2.6 g/dL (ref 1.5–4.5)
GLUCOSE: 292 mg/dL — AB (ref 65–99)
Potassium: 4.9 mmol/L (ref 3.5–5.2)
Sodium: 139 mmol/L (ref 134–144)
Total Protein: 7.1 g/dL (ref 6.0–8.5)

## 2017-03-18 LAB — LIPID PANEL
CHOL/HDL RATIO: 2.2 ratio (ref 0.0–4.4)
Cholesterol, Total: 120 mg/dL (ref 100–199)
HDL: 54 mg/dL (ref >39)
LDL CALC: 30 mg/dL (ref 0–99)
TRIGLYCERIDES: 181 mg/dL — AB (ref 0–149)
VLDL Cholesterol Cal: 36 mg/dL (ref 5–40)

## 2017-03-18 LAB — TSH: TSH: 3.08 u[IU]/mL (ref 0.450–4.500)

## 2017-03-18 LAB — PLEASE NOTE

## 2017-03-21 ENCOUNTER — Other Ambulatory Visit (HOSPITAL_COMMUNITY)
Admission: RE | Admit: 2017-03-21 | Discharge: 2017-03-21 | Disposition: A | Discharge status: Home / Self Care | Payer: Medicare Other | Source: Ambulatory Visit | Attending: Obstetrics & Gynecology | Admitting: Obstetrics & Gynecology

## 2017-03-21 ENCOUNTER — Ambulatory Visit (INDEPENDENT_AMBULATORY_CARE_PROVIDER_SITE_OTHER): Payer: Medicare Other | Admitting: Obstetrics & Gynecology

## 2017-03-21 ENCOUNTER — Encounter: Payer: Self-pay | Admitting: Obstetrics & Gynecology

## 2017-03-21 VITALS — BP 133/82 | HR 99 | Wt 202.9 lb

## 2017-03-21 DIAGNOSIS — B9689 Other specified bacterial agents as the cause of diseases classified elsewhere: Secondary | ICD-10-CM | POA: Insufficient documentation

## 2017-03-21 DIAGNOSIS — Z113 Encounter for screening for infections with a predominantly sexual mode of transmission: Secondary | ICD-10-CM | POA: Diagnosis not present

## 2017-03-21 DIAGNOSIS — N760 Acute vaginitis: Secondary | ICD-10-CM | POA: Insufficient documentation

## 2017-03-21 DIAGNOSIS — N898 Other specified noninflammatory disorders of vagina: Secondary | ICD-10-CM

## 2017-03-21 DIAGNOSIS — Z01419 Encounter for gynecological examination (general) (routine) without abnormal findings: Secondary | ICD-10-CM | POA: Diagnosis not present

## 2017-03-21 DIAGNOSIS — Z1231 Encounter for screening mammogram for malignant neoplasm of breast: Secondary | ICD-10-CM

## 2017-03-21 MED ORDER — METRONIDAZOLE 500 MG PO TABS: 500.0000 mg | ORAL_TABLET | Freq: Two times a day (BID) | ORAL | 6 refills | Status: DC

## 2017-03-21 NOTE — Addendum Note (Signed)
Addended by: Shelly Coss on: 03/21/2017 05:05 PM   Modules accepted: Orders

## 2017-03-21 NOTE — Progress Notes (Signed)
Subjective:    Sarah Long is a 46 y.o. engaged AA P4 (42, 46, 7, and 67 yo kids)  female who presents for an annual exam. The patient has no complaints today. She still has RLQ pain and worry about yeast/bacterial infection. She is taking OTC IBU 800mg  q8 hours prn. This does help the pain somewhat. Periods are very painful. The patient is sexually active. She has pain on occasion after sex. She is able to have orgasm. GYN screening history: last pap: was normal. The patient wears seatbelts: yes. The patient participates in regular exercise: no. Has the patient ever been transfused or tattooed?: no. The patient reports that there is not domestic violence in her life.   Menstrual History: OB History    Gravida Para Term Preterm AB Living   6 4 1 3 2 4    SAB TAB Ectopic Multiple Live Births   1 1 0 0 3      Menarche age: 55 Patient's last menstrual period was 03/14/2017 (exact date).    The following portions of the patient's history were reviewed and updated as appropriate: allergies, current medications, past family history, past medical history, past social history, past surgical history and problem list.  Review of Systems Pertinent items are noted in HPI.   On disabillity for pelvic pain "endometriosis" and mental illness. Monogamous for 5 years, live together Not using contraception but she does not want more kids.   Objective:    BP 133/82   Pulse 99   Wt 202 lb 14.4 oz (92 kg)   LMP 03/14/2017 (Exact Date)   BMI 33.76 kg/m   General Appearance:    Alert, cooperative, no distress, appears stated age  Head:    Normocephalic, without obvious abnormality, atraumatic  Eyes:    PERRL, conjunctiva/corneas clear, EOM's intact, fundi    benign, both eyes  Ears:    Normal TM's and external ear canals, both ears  Nose:   Nares normal, septum midline, mucosa normal, no drainage    or sinus tenderness  Throat:   Lips, mucosa, and tongue normal; teeth and gums normal  Neck:   Supple,  symmetrical, trachea midline, no adenopathy;    thyroid:  no enlargement/tenderness/nodules; no carotid   bruit or JVD  Back:     Symmetric, no curvature, ROM normal, no CVA tenderness  Lungs:     Clear to auscultation bilaterally, respirations unlabored  Chest Wall:    No tenderness or deformity   Heart:    Regular rate and rhythm, S1 and S2 normal, no murmur, rub   or gallop  Breast Exam:    No tenderness, masses, or nipple abnormality  Abdomen:     Soft, non-tender, bowel sounds active all four quadrants,    no masses, no organomegaly  Genitalia:    Normal female without lesion, discharge or tenderness, discharge c/w BV. No masses on bimanual     Extremities:   Extremities normal, atraumatic, no cyanosis or edema  Pulses:   2+ and symmetric all extremities  Skin:   Skin color, texture, turgor normal, no rashes or lesions  Lymph nodes:   Cervical, supraclavicular, and axillary nodes normal  Neurologic:   CNII-XII intact, normal strength, sensation and reflexes    throughout  .    Assessment:    Healthy female exam.   Contraception   Plan:     Breast self exam technique reviewed and patient encouraged to perform self-exam monthly. Mammogram.   Wet prep Treat  for BV with flagyl with refills, rec boric acid prn and probiotics daily Pap in 2017 Discussed BTL (she has medicaid family planning)- sign papers today

## 2017-03-21 NOTE — Progress Notes (Signed)
Mammogram scheduled @ [facility] for April 18th @ 0800.  Pt notified.

## 2017-03-23 ENCOUNTER — Ambulatory Visit: Payer: Medicare Other

## 2017-03-23 LAB — CERVICOVAGINAL ANCILLARY ONLY
Bacterial vaginitis: POSITIVE — AB
Candida vaginitis: NEGATIVE
Chlamydia: NEGATIVE
Neisseria Gonorrhea: NEGATIVE
Trichomonas: NEGATIVE

## 2017-04-06 ENCOUNTER — Ambulatory Visit (INDEPENDENT_AMBULATORY_CARE_PROVIDER_SITE_OTHER): Payer: Medicare Other | Admitting: Physician Assistant

## 2017-05-28 DIAGNOSIS — E109 Type 1 diabetes mellitus without complications: Secondary | ICD-10-CM | POA: Diagnosis not present

## 2017-06-07 ENCOUNTER — Other Ambulatory Visit: Payer: Self-pay | Admitting: Family Medicine

## 2017-06-14 ENCOUNTER — Telehealth: Payer: Self-pay | Admitting: Pharmacist

## 2017-06-14 ENCOUNTER — Other Ambulatory Visit: Payer: Self-pay | Admitting: Family Medicine

## 2017-06-14 ENCOUNTER — Telehealth: Payer: Self-pay | Admitting: Family Medicine

## 2017-06-14 NOTE — Telephone Encounter (Signed)
These are typically fraudulent requests and we only honor them if the patient specifically requests them.

## 2017-06-14 NOTE — Telephone Encounter (Signed)
-----   Message from Alfonse Spruce, [place] sent at 06/14/2017  1:17 PM EDT ----- Regarding: Faxed Medication Requests Called patient to follow up with prescriptions request forms from [facility]. Prescriptions for Lidocaine, Omega Fish Oil, and Diabetic supplies. Please confirm with patient if she requested these supplies. I do not see where she was recently prescribed lidocaine or omega fish oil. She is currently taking atorvastatin. Lidocaine and omega fish oil will not be prescribed. Will provide refill for diabetic supplies if needed.   Thanks,  Fredia Beets,  FNP-BC

## 2017-06-20 NOTE — Telephone Encounter (Signed)
CMA call regarding medication refill verification  Patient did not answer but left a VM stating the reason of the call & to call back

## 2017-06-20 NOTE — Telephone Encounter (Signed)
-----   Message from Alfonse Spruce, [place] sent at 06/14/2017  1:17 PM EDT ----- Regarding: Faxed Medication Requests Called patient to follow up with prescriptions request forms from [facility]. Prescriptions for Lidocaine, Omega Fish Oil, and Diabetic supplies. Please confirm with patient if she requested these supplies. I do not see where she was recently prescribed lidocaine or omega fish oil. She is currently taking atorvastatin. Lidocaine and omega fish oil will not be prescribed. Will provide refill for diabetic supplies if needed.   Thanks,  Fredia Beets,  FNP-BC

## 2017-07-26 ENCOUNTER — Telehealth: Payer: Self-pay

## 2017-07-26 NOTE — Telephone Encounter (Signed)
-----   Message from Alfonse Spruce, [place] sent at 07/22/2017  3:22 PM EDT ----- Regarding: Medication Fax follow up Di Kindle,  Received fax from [facility] regarding request for lidocaine ointment. Please call Ms.Grauberger to for follow up. I need to know if she placed this request.  Sincerely,  Fredia Beets FNP-BC

## 2017-07-26 NOTE — Telephone Encounter (Signed)
CMA call regarding f/up with medication   Patient did not answer & left a detailed message stating to call me back

## 2017-08-23 ENCOUNTER — Telehealth: Payer: Self-pay

## 2017-08-23 NOTE — Telephone Encounter (Signed)
CMA call patient regarding following up with her regarding [facility] requesting medications  Patient did not answer CMA left a VM stating the reason of the call & to call back

## 2017-08-27 DIAGNOSIS — E109 Type 1 diabetes mellitus without complications: Secondary | ICD-10-CM | POA: Diagnosis not present

## 2017-09-06 ENCOUNTER — Telehealth: Payer: Self-pay | Admitting: Family Medicine

## 2017-09-06 ENCOUNTER — Other Ambulatory Visit: Payer: Self-pay | Admitting: Family Medicine

## 2017-09-06 NOTE — Telephone Encounter (Signed)
Called and spoke with representative Wilma from [facility] regarding faxed prescription requests. Attempts to contact patient have been made multiple times without response. Medication fax requests for omega 3 medication and lidocaine ointment. Patient last seen in office 03/2017. No recent prescriptions for these medications placed within the last 2 years according to chart. Representative reports will make note and attempt to contact patient.

## 2017-09-21 ENCOUNTER — Emergency Department (HOSPITAL_COMMUNITY)
Admission: EM | Admit: 2017-09-21 | Discharge: 2017-09-21 | Disposition: A | Discharge status: Home / Self Care | Payer: Medicare Other | Attending: Emergency Medicine | Admitting: Emergency Medicine

## 2017-09-21 ENCOUNTER — Encounter (HOSPITAL_COMMUNITY): Payer: Self-pay

## 2017-09-21 DIAGNOSIS — Z794 Long term (current) use of insulin: Secondary | ICD-10-CM | POA: Insufficient documentation

## 2017-09-21 DIAGNOSIS — F17210 Nicotine dependence, cigarettes, uncomplicated: Secondary | ICD-10-CM | POA: Diagnosis not present

## 2017-09-21 DIAGNOSIS — Z7982 Long term (current) use of aspirin: Secondary | ICD-10-CM | POA: Insufficient documentation

## 2017-09-21 DIAGNOSIS — Z79899 Other long term (current) drug therapy: Secondary | ICD-10-CM | POA: Diagnosis not present

## 2017-09-21 DIAGNOSIS — R109 Unspecified abdominal pain: Secondary | ICD-10-CM | POA: Diagnosis present

## 2017-09-21 DIAGNOSIS — E119 Type 2 diabetes mellitus without complications: Secondary | ICD-10-CM | POA: Insufficient documentation

## 2017-09-21 DIAGNOSIS — N390 Urinary tract infection, site not specified: Secondary | ICD-10-CM

## 2017-09-21 DIAGNOSIS — I10 Essential (primary) hypertension: Secondary | ICD-10-CM | POA: Insufficient documentation

## 2017-09-21 LAB — COMPREHENSIVE METABOLIC PANEL
ALT: 32 U/L (ref 14–54)
AST: 28 U/L (ref 15–41)
Albumin: 3.3 g/dL — ABNORMAL LOW (ref 3.5–5.0)
Alkaline Phosphatase: 85 U/L (ref 38–126)
Anion gap: 12 (ref 5–15)
BILIRUBIN TOTAL: 0.6 mg/dL (ref 0.3–1.2)
BUN: 8 mg/dL (ref 6–20)
CALCIUM: 9.2 mg/dL (ref 8.9–10.3)
CO2: 24 mmol/L (ref 22–32)
CREATININE: 0.75 mg/dL (ref 0.44–1.00)
Chloride: 97 mmol/L — ABNORMAL LOW (ref 101–111)
GFR calc Af Amer: >60 mL/min (ref >60)
Glucose, Bld: 302 mg/dL — ABNORMAL HIGH (ref 65–99)
POTASSIUM: 3.8 mmol/L (ref 3.5–5.1)
Sodium: 133 mmol/L — ABNORMAL LOW (ref 135–145)
TOTAL PROTEIN: 7.5 g/dL (ref 6.5–8.1)

## 2017-09-21 LAB — CBC
HCT: 37.9 % (ref 36.0–46.0)
Hemoglobin: 12.8 g/dL (ref 12.0–15.0)
MCH: 29.9 pg (ref 26.0–34.0)
MCHC: 33.8 g/dL (ref 30.0–36.0)
MCV: 88.6 fL (ref 78.0–100.0)
PLATELETS: 268 10*3/uL (ref 150–400)
RBC: 4.28 MIL/uL (ref 3.87–5.11)
RDW: 13.5 % (ref 11.5–15.5)
WBC: 10.7 10*3/uL — AB (ref 4.0–10.5)

## 2017-09-21 LAB — URINALYSIS, ROUTINE W REFLEX MICROSCOPIC
Bilirubin Urine: NEGATIVE
Ketones, ur: NEGATIVE mg/dL
NITRITE: POSITIVE — AB
PH: 6 (ref 5.0–8.0)
PROTEIN: 100 mg/dL — AB
Specific Gravity, Urine: 1.021 (ref 1.005–1.030)

## 2017-09-21 LAB — I-STAT BETA HCG BLOOD, ED (MC, WL, AP ONLY): I-stat hCG, quantitative: <5 m[IU]/mL (ref <5)

## 2017-09-21 LAB — LIPASE, BLOOD: Lipase: 33 U/L (ref 11–51)

## 2017-09-21 MED ORDER — CEPHALEXIN 500 MG PO CAPS: 500.0000 mg | ORAL_CAPSULE | Freq: Four times a day (QID) | ORAL | 0 refills | Status: DC

## 2017-09-21 MED ORDER — ONDANSETRON HCL 4 MG PO TABS: 4.0000 mg | ORAL_TABLET | Freq: Four times a day (QID) | ORAL | 0 refills | Status: DC

## 2017-09-21 MED ORDER — MORPHINE SULFATE (PF) 4 MG/ML IV SOLN
4.0000 mg | Freq: Once | INTRAVENOUS | Status: AC
  Administered 2017-09-21: 4 mg via INTRAVENOUS
  Filled 2017-09-21: qty 1

## 2017-09-21 MED ORDER — SODIUM CHLORIDE 0.9 % IV BOLUS (SEPSIS)
1000.0000 mL | Freq: Once | INTRAVENOUS | Status: AC
  Administered 2017-09-21: 1000 mL via INTRAVENOUS

## 2017-09-21 MED ORDER — ONDANSETRON 4 MG PO TBDP
ORAL_TABLET | ORAL | Status: AC
  Filled 2017-09-21: qty 1

## 2017-09-21 MED ORDER — PHENAZOPYRIDINE HCL 95 MG PO TABS: 95.0000 mg | ORAL_TABLET | Freq: Three times a day (TID) | ORAL | 0 refills | Status: DC | PRN

## 2017-09-21 MED ORDER — ONDANSETRON 4 MG PO TBDP
4.0000 mg | ORAL_TABLET | Freq: Once | ORAL | Status: AC | PRN
  Administered 2017-09-21: 4 mg via ORAL

## 2017-09-21 MED ORDER — DEXTROSE 5 % IV SOLN
1.0000 g | Freq: Once | INTRAVENOUS | Status: AC
  Administered 2017-09-21: 1 g via INTRAVENOUS
  Filled 2017-09-21: qty 10

## 2017-09-21 MED ORDER — ONDANSETRON HCL 4 MG/2ML IJ SOLN
4.0000 mg | Freq: Once | INTRAMUSCULAR | Status: AC
  Administered 2017-09-21: 4 mg via INTRAVENOUS
  Filled 2017-09-21: qty 2

## 2017-09-21 NOTE — ED Provider Notes (Signed)
[facility] EMERGENCY DEPARTMENT Provider Note   CSN: [id number] Arrival date & time: 09/21/17  0018     History   Chief Complaint Chief Complaint  Patient presents with  . Abdominal Pain  . Emesis    HPI Sarah Long is a 46 y.o. female.  Patient presents emergency department with chief complaint of gradually worsening, abdominal pain. She states that the symptoms started earlier today. She reports associated urgency and frequency of urination. She reports associated nausea and vomiting. She denies any fevers chills. She has not taken anything for symptoms. There are no other associated symptoms. There are no modifying factors.   The history is provided by the patient. No language interpreter was used.    Past Medical History:  Diagnosis Date  . Abnormal Pap smear 1998  . Abnormal vaginal bleeding 12/19/2011  . Bacterial infection   . Bipolar 1 disorder ([place])   . Candida vaginitis 07/2007  . Depression    recently added wellbutrin-has not taken yet for bipolar  . Diabetes in pregnancy   . Diabetes mellitus    nph 20U qam and qpm, regular with meals  . Fibroid   . Galactorrhea of right breast 2008  . H/O amenorrhea 07/2007  . H/O dizziness 10/14/11  . H/O dysmenorrhea 2010  . H/O menorrhagia 10/14/11  . H/O varicella   . Headache(784.0)   . Heavy vaginal bleeding due to contraceptive injection use 10/12/2011   Depo Provera  . Herpes   . HSV-2 infection 01/03/2009  . Hx: UTI (urinary tract infection) 2009  . Hypertension    on aldomet  . Increased BMI 2010  . Obesity 10/14/11  . Oligomenorrhea 07/2007  . Pelvic pain in female   . Preterm labor   . Trichomonas   . Yeast infection     Patient Active Problem List   Diagnosis Date Noted  . Blister of second toe of left foot 11/21/2016  . Diabetes type 2, uncontrolled ([place]) 10/21/2016  . Vaginal yeast infection 10/21/2016  . Swelling of left ring finger 10/21/2016  . Smoking 11/26/2013  .  Irregular uterine bleeding 04/04/2012  . HYPERLIPIDEMIA 02/02/2007  . BIPOLAR DISORDER 02/02/2007  . HYPERTENSION, BENIGN SYSTEMIC 02/02/2007    Past Surgical History:  Procedure Laterality Date  . CESAREAN SECTION  1991    OB History    Gravida Para Term Preterm AB Living   _0 SAB TAB Ectopic Multiple Live Births   1 1 0 0 3       Home Medications    Prior to Admission medications   Medication Sig Start Date End Date Taking? Authorizing Provider  Adhesive Bandages (2ND SKIN BLISTER KIT) MISC 1 each by Does not apply route daily. 11/18/16   Funches, Adriana Mccallum, MD  amLODipine (NORVASC) 10 MG tablet Take 1 tablet (10 mg total) by mouth daily. 03/07/17   Clent Demark, PA-C  aspirin 81 MG tablet Take 1 tablet (81 mg total) by mouth daily. 03/07/17   Clent Demark, PA-C  atorvastatin (LIPITOR) 40 MG tablet Take 1 tablet (40 mg total) by mouth daily. 03/07/17   Clent Demark, PA-C  Blood Glucose Monitoring Suppl (TRUE METRIX METER) w/Device KIT 1 each by Does not apply route as needed. 03/07/17   Clent Demark, PA-C  diphenhydrAMINE (SOMINEX) 25 MG tablet Take 25 mg by mouth at bedtime as needed for itching, allergies or sleep.    [provider]  fluconazole (DIFLUCAN) 150 MG tablet Take 1 tablet (150 mg total) by mouth every 3 (three) days. 03/07/17   Clent Demark, PA-C  glucose blood (TRUE METRIX BLOOD GLUCOSE TEST) test strip 1 each by Other route every morning. 03/07/17   Clent Demark, PA-C  hydrochlorothiazide (HYDRODIURIL) 12.5 MG tablet Take 1 tablet (12.5 mg total) by mouth daily. 03/07/17   Clent Demark, PA-C  insulin glargine (LANTUS) 100 UNIT/ML injection Inject 0.6 mLs (60 Units total) into the skin at bedtime. 03/07/17   Clent Demark, PA-C  Insulin Pen Needle (B-D ULTRAFINE III SHORT PEN) 31G X 8 MM MISC 1 application by Does not apply route daily. 03/07/17   Clent Demark, PA-C  Insulin Syringe-Needle U-100 (TRUEPLUS  INSULIN SYRINGE) 31G X 5/16" 1 ML MISC Use as directed 03/08/17   Clent Demark, PA-C  lisinopril (PRINIVIL,ZESTRIL) 40 MG tablet Take 1 tablet (40 mg total) by mouth daily. 03/07/17   Clent Demark, PA-C  metFORMIN (GLUCOPHAGE) 1000 MG tablet Take 1 tablet (1,000 mg total) by mouth 2 (two) times daily with a meal. 03/07/17   Clent Demark, PA-C  metroNIDAZOLE (FLAGYL) 500 MG tablet Take 1 tablet (500 mg total) by mouth 2 (two) times daily. 03/21/17   Emily Filbert, MD  TRUEPLUS LANCETS 28G MISC 1 each by Does not apply route every morning. 10/21/16   Boykin Nearing, MD    Family History Family History  Problem Relation Age of Onset  . Hypertension Mother   . Diabetes Mother   . Heart disease Mother   . Hypertension Father   . Diabetes Father   . Heart disease Father   . Stroke Maternal Grandfather   . Other Neg Hx     Social History Social History  Substance Use Topics  . Smoking status: Current Every Day Smoker    Packs/day: 0.50    Years: 20.00    Types: Cigarettes  . Smokeless tobacco: Never Used  . Alcohol use No     Allergies   Strawberry extract and Sulfa antibiotics   Review of Systems Review of Systems  All other systems reviewed and are negative.    Physical Exam Updated Vital Signs BP 129/79   Pulse 97   Temp 97.8 F (36.6 C) (Oral)   Resp 14   LMP 09/14/2017 (Approximate)   SpO2 100%   Physical Exam  Constitutional: She is oriented to person, place, and time. She appears well-developed and well-nourished.  HENT:  Head: Normocephalic and atraumatic.  Eyes: Pupils are equal, round, and reactive to light. Conjunctivae and EOM are normal.  Neck: Normal range of motion. Neck supple.  Cardiovascular: Normal rate and regular rhythm.  Exam reveals no gallop and no friction rub.   No murmur heard. Pulmonary/Chest: Effort normal and breath sounds normal. No respiratory distress. She has no wheezes. She has no rales. She exhibits no tenderness.    Abdominal: Soft. Bowel sounds are normal. She exhibits no distension and no mass. There is no tenderness. There is no rebound and no guarding.  No focal abdominal tenderness, no RLQ tenderness or pain at McBurney's point, no RUQ tenderness or Murphy's sign, no left-sided abdominal tenderness, no fluid wave, or signs of peritonitis Some discomfort with upper abdominal palpation   Musculoskeletal: Normal range of motion. She exhibits no edema or tenderness.  Neurological: She is alert and oriented to person, place, and time.  Skin: Skin is warm and dry.  Psychiatric: She has a  normal mood and affect. Her behavior is normal. Judgment and thought content normal.  Nursing note and vitals reviewed.    ED Treatments / Results  Labs (all labs ordered are listed, but only abnormal results are displayed) Labs Reviewed  COMPREHENSIVE METABOLIC PANEL - Abnormal; Notable for the following:       Result Value   Sodium 133 (*)    Chloride 97 (*)    Glucose, Bld 302 (*)    Albumin 3.3 (*)    All other components within normal limits  CBC - Abnormal; Notable for the following:    WBC 10.7 (*)    All other components within normal limits  URINALYSIS, ROUTINE W REFLEX MICROSCOPIC - Abnormal; Notable for the following:    APPearance CLOUDY (*)    Glucose, UA >=500 (*)    Hgb urine dipstick LARGE (*)    Protein, ur 100 (*)    Nitrite POSITIVE (*)    Leukocytes, UA LARGE (*)    Bacteria, UA RARE (*)    Squamous Epithelial / LPF 0-5 (*)    Non Squamous Epithelial 0-5 (*)    All other components within normal limits  LIPASE, BLOOD  I-STAT BETA HCG BLOOD, ED ([facility], [facility], [facility] ONLY)    EKG  EKG Interpretation None       Radiology No results found.  Procedures Procedures (including critical care time)  Medications Ordered in ED Medications  sodium chloride 0.9 % bolus 1,000 mL (not administered)  morphine 4 MG/ML injection 4 mg (not administered)  ondansetron (ZOFRAN) injection 4 mg (not  administered)  cefTRIAXone (ROCEPHIN) 1 g in dextrose 5 % 50 mL IVPB (not administered)  ondansetron (ZOFRAN-ODT) disintegrating tablet 4 mg (4 mg Oral Given 09/21/17 0029)     Initial Impression / Assessment and Plan / ED Course  I have reviewed the triage vital signs and the nursing notes.  Pertinent labs & imaging results that were available during my care of the patient were reviewed by me and considered in my medical decision making (see chart for details).     Patient with abdominal pain, urgency, and frequency. Urinalysis is consistent with infection. Vital signs are stable. We'll treat pain and give Rocephin in the ED. Anticipate discharge to home with outpatient follow-up. She has no tenderness to palpation in the right lower quadrant, I doubt appendicitis. I did discuss clear return precautions with the patient.  Patient understands and agrees with the plan.  Patient feels significantly improved after medications ED. Will discharge to home.  Final Clinical Impressions(s) / ED Diagnoses   Final diagnoses:  Urinary tract infection without hematuria, site unspecified    New Prescriptions New Prescriptions   CEPHALEXIN (KEFLEX) 500 MG CAPSULE    Take 1 capsule (500 mg total) by mouth 4 (four) times daily.   ONDANSETRON (ZOFRAN) 4 MG TABLET    Take 1 tablet (4 mg total) by mouth every 6 (six) hours.   PHENAZOPYRIDINE (PYRIDIUM) 95 MG TABLET    Take 1 tablet (95 mg total) by mouth 3 (three) times daily as needed for pain.     Montine Circle, PA-C 09/21/17 [place], Delice Bison, DO 09/21/17 [phone]

## 2017-09-21 NOTE — ED Notes (Signed)
Pt states this feels like her last kidney infection. Pt has been having urination urgency.

## 2017-09-21 NOTE — ED Triage Notes (Signed)
Pt state she starting having severe right side abdominal pains with vomitting since 12pm this afternoon; pt c/o pain at 10/10 on arrival; pt a&ox 4; pt states he still have appendix-Monique,RN

## 2017-09-28 ENCOUNTER — Encounter (INDEPENDENT_AMBULATORY_CARE_PROVIDER_SITE_OTHER): Payer: Self-pay | Admitting: Physician Assistant

## 2017-09-28 ENCOUNTER — Ambulatory Visit (INDEPENDENT_AMBULATORY_CARE_PROVIDER_SITE_OTHER): Payer: Medicare Other | Admitting: Physician Assistant

## 2017-09-28 VITALS — BP 173/78 | HR 87 | Temp 97.7°F | Wt 202.8 lb

## 2017-09-28 DIAGNOSIS — I10 Essential (primary) hypertension: Secondary | ICD-10-CM | POA: Diagnosis not present

## 2017-09-28 DIAGNOSIS — Z09 Encounter for follow-up examination after completed treatment for conditions other than malignant neoplasm: Secondary | ICD-10-CM | POA: Diagnosis not present

## 2017-09-28 DIAGNOSIS — E11628 Type 2 diabetes mellitus with other skin complications: Secondary | ICD-10-CM

## 2017-09-28 DIAGNOSIS — L089 Local infection of the skin and subcutaneous tissue, unspecified: Secondary | ICD-10-CM | POA: Diagnosis not present

## 2017-09-28 DIAGNOSIS — N390 Urinary tract infection, site not specified: Secondary | ICD-10-CM | POA: Diagnosis not present

## 2017-09-28 DIAGNOSIS — Z794 Long term (current) use of insulin: Secondary | ICD-10-CM | POA: Diagnosis not present

## 2017-09-28 DIAGNOSIS — Z9119 Patient's noncompliance with other medical treatment and regimen: Secondary | ICD-10-CM | POA: Diagnosis not present

## 2017-09-28 DIAGNOSIS — Z23 Encounter for immunization: Secondary | ICD-10-CM | POA: Diagnosis not present

## 2017-09-28 DIAGNOSIS — E1142 Type 2 diabetes mellitus with diabetic polyneuropathy: Secondary | ICD-10-CM | POA: Diagnosis not present

## 2017-09-28 DIAGNOSIS — Z91199 Patient's noncompliance with other medical treatment and regimen due to unspecified reason: Secondary | ICD-10-CM

## 2017-09-28 LAB — POCT URINALYSIS DIPSTICK
BILIRUBIN UA: NEGATIVE
Ketones, UA: NEGATIVE
LEUKOCYTES UA: NEGATIVE
NITRITE UA: NEGATIVE
PH UA: 5.5 (ref 5.0–8.0)
PROTEIN UA: 100
Spec Grav, UA: 1.01 (ref 1.010–1.025)
Urobilinogen, UA: 0.2 E.U./dL

## 2017-09-28 LAB — POCT GLYCOSYLATED HEMOGLOBIN (HGB A1C): HEMOGLOBIN A1C: 12.7

## 2017-09-28 MED FILL — TRUEPLUS SYR 1ML 31GX5/16": 31G X 5/16" | 25 days supply | Qty: 100 | Fill #1

## 2017-09-28 MED FILL — TRUE METRIX TEST STRIP: 50 days supply | Qty: 100 | Fill #0

## 2017-09-28 MED FILL — !LANTUS 100 UNITS/ML VIAL: 100 | 16 days supply | Qty: 10 | Fill #1

## 2017-09-28 MED FILL — ?METFORMIN HCL 1,000 MG TAB: 1000 | 30 days supply | Qty: 60 | Fill #1

## 2017-09-28 MED FILL — LISINOPRIL 40 MG TABLET: 40 | 30 days supply | Qty: 30 | Fill #1

## 2017-09-28 MED FILL — ?ATORVASTATIN 40MG TABLET: 40 | 30 days supply | Qty: 30 | Fill #1

## 2017-09-28 MED FILL — TRUEPLUS SYR 1ML 31GX5/16: 31G X 5/16" | 25 days supply | Qty: 100 | Fill #1

## 2017-09-28 MED FILL — AMLODIPINE BESYLATE 10 MG T: 10 | 30 days supply | Qty: 30 | Fill #1

## 2017-09-28 MED FILL — !TRUE METRIX BLOOD GLUCOSE: 1 days supply | Qty: 1 | Fill #0

## 2017-09-28 MED FILL — ?HYDROCHLOROTHIAZIDE 12.5MG: 12.5 | 30 days supply | Qty: 30 | Fill #1

## 2017-09-28 MED FILL — metroNIDAZOLE 500 MG TABS: 500 | 7 days supply | Qty: 14 | Fill #0

## 2017-09-28 NOTE — Progress Notes (Signed)
Subjective:  Patient ID: Sarah Long, female    DOB: 11-Mar-1971  Age: 46 y.o. MRN: [id number]  CC: abdominal pain/UTI hospital f/u  HPI Sarah Long is a 46 y.o. female with a medical history of multiple comorbidities presents on hospital f/u of UTI. Has been taking cephalexin and feeling better in regards to dysuria. Still has some urinary frequency but does not know if this is from diabetes. She is an uncontrolled diabetic and has not taken her medications since it was prescribed in April 2018. She has developed a right toe ulcer and infection. Has been soaking the toe in water to "help clean infection out". Seems cephalexin has also helped with the toe infection. Shows pictures of the initial infection and the appearance of the toe is much better today. Keeps the wound covered with a bandaid.      Outpatient Medications Prior to Visit  Medication Sig Dispense Refill  . Adhesive Bandages (2ND SKIN BLISTER KIT) MISC 1 each by Does not apply route daily. (Patient not taking: Reported on 09/28/2017) 2 each 0  . amLODipine (NORVASC) 10 MG tablet Take 1 tablet (10 mg total) by mouth daily. (Patient not taking: Reported on 09/28/2017) 90 tablet 3  . aspirin 81 MG tablet Take 1 tablet (81 mg total) by mouth daily. (Patient not taking: Reported on 09/28/2017) 90 tablet 3  . atorvastatin (LIPITOR) 40 MG tablet Take 1 tablet (40 mg total) by mouth daily. (Patient not taking: Reported on 09/28/2017) 90 tablet 3  . Blood Glucose Monitoring Suppl (TRUE METRIX METER) w/Device KIT 1 each by Does not apply route as needed. (Patient not taking: Reported on 09/28/2017) 1 kit 0  . cephALEXin (KEFLEX) 500 MG capsule Take 1 capsule (500 mg total) by mouth 4 (four) times daily. (Patient not taking: Reported on 09/28/2017) 28 capsule 0  . diphenhydrAMINE (SOMINEX) 25 MG tablet Take 25 mg by mouth at bedtime as needed for itching, allergies or sleep.    . fluconazole (DIFLUCAN) 150 MG tablet Take 1 tablet (150 mg  total) by mouth every 3 (three) days. (Patient not taking: Reported on 09/28/2017) 3 tablet 0  . glucose blood (TRUE METRIX BLOOD GLUCOSE TEST) test strip 1 each by Other route every morning. (Patient not taking: Reported on 09/28/2017) 90 each 3  . hydrochlorothiazide (HYDRODIURIL) 12.5 MG tablet Take 1 tablet (12.5 mg total) by mouth daily. (Patient not taking: Reported on 09/28/2017) 90 tablet 1  . insulin glargine (LANTUS) 100 UNIT/ML injection Inject 0.6 mLs (60 Units total) into the skin at bedtime. (Patient not taking: Reported on 09/28/2017) 18 mL 5  . Insulin Pen Needle (B-D ULTRAFINE III SHORT PEN) 31G X 8 MM MISC 1 application by Does not apply route daily. (Patient not taking: Reported on 09/28/2017) 90 each 3  . Insulin Syringe-Needle U-100 (TRUEPLUS INSULIN SYRINGE) 31G X 5/16" 1 ML MISC Use as directed (Patient not taking: Reported on 09/28/2017) 100 each 12  . lisinopril (PRINIVIL,ZESTRIL) 40 MG tablet Take 1 tablet (40 mg total) by mouth daily. (Patient not taking: Reported on 09/28/2017) 90 tablet 1  . metFORMIN (GLUCOPHAGE) 1000 MG tablet Take 1 tablet (1,000 mg total) by mouth 2 (two) times daily with a meal. (Patient not taking: Reported on 09/28/2017) 180 tablet 3  . metroNIDAZOLE (FLAGYL) 500 MG tablet Take 1 tablet (500 mg total) by mouth 2 (two) times daily. (Patient not taking: Reported on 09/28/2017) 14 tablet 6  . ondansetron (ZOFRAN) 4 MG tablet Take 1  tablet (4 mg total) by mouth every 6 (six) hours. (Patient not taking: Reported on 09/28/2017) 12 tablet 0  . phenazopyridine (PYRIDIUM) 95 MG tablet Take 1 tablet (95 mg total) by mouth 3 (three) times daily as needed for pain. (Patient not taking: Reported on 09/28/2017) 10 tablet 0  . TRUEPLUS LANCETS 28G MISC 1 each by Does not apply route every morning. (Patient not taking: Reported on 09/28/2017) 90 each 3   No facility-administered medications prior to visit.      ROS Review of Systems  Constitutional: Negative  for chills, fever and malaise/fatigue.  Eyes: Negative for blurred vision.  Respiratory: Negative for shortness of breath.   Cardiovascular: Negative for chest pain and palpitations.  Gastrointestinal: Negative for abdominal pain and nausea.  Genitourinary: Positive for frequency. Negative for dysuria and hematuria.  Musculoskeletal: Negative for joint pain and myalgias.  Skin: Negative for rash.       Toe wound  Neurological: Negative for tingling and headaches.  Psychiatric/Behavioral: Negative for depression. The patient is not nervous/anxious.     Objective:  Wt 202 lb 12.8 oz (92 kg)   LMP 09/14/2017 (Approximate)   BMI 33.75 kg/m   BP/Weight 09/28/2017 09/21/2017 0/08/3817  Systolic BP - 299 371  Diastolic BP - 97 82  Wt. (Lbs) 202.8 - 202.9  BMI 33.75 - 33.76      Physical Exam  Constitutional: She is oriented to person, place, and time.  Well developed, obese, NAD, polite  HENT:  Head: Normocephalic and atraumatic.  Eyes: Conjunctivae are normal. No scleral icterus.  Neck: Normal range of motion. Neck supple. No thyromegaly present.  Cardiovascular: Normal rate, regular rhythm and normal heart sounds.   Pulmonary/Chest: Effort normal and breath sounds normal.  Musculoskeletal: She exhibits no edema.  Neurological: She is alert and oriented to person, place, and time. No cranial nerve deficit. Coordination normal.  Skin: Skin is warm and dry.  Right toe with macerated wound. Appears better than original infection seen on pictures  Psychiatric: She has a normal mood and affect. Her behavior is normal. Thought content normal.  Vitals reviewed.    Assessment & Plan:    1. Hospital discharge follow-up - Urinalysis Dipstick with glucose, protein, and blood. No leuks or nit.  2. Urinary tract infection without hematuria, site unspecified - Resolved with cephalexin from the ED.  3. Type 2 diabetes mellitus with diabetic polyneuropathy, with long-term current use  of insulin ([facility]CC) - HgB A1c 12.7% in clinic today, worse than previous 12.0% due to noncompliance  4. Diabetic foot infection ([place]) - WOUND CULTURE - Referral to wound care center - Wound seems to be healing when compared to the pictures she took and infection onset.   5. Hypertension, unspecified type - Advised to begin taking medications already prescribed.   6. Noncompliance  Follow-up: Return in about 4 weeks (around 10/26/2017) for diabetes.   Clent Demark PA

## 2017-09-28 NOTE — Patient Instructions (Signed)
Diabetes Mellitus and Food It is important for you to manage your blood sugar (glucose) level. Your blood glucose level can be greatly affected by what you eat. Eating healthier foods in the appropriate amounts throughout the day at about the same time each day will help you control your blood glucose level. It can also help slow or prevent worsening of your diabetes mellitus. Healthy eating may even help you improve the level of your blood pressure and reach or maintain a healthy weight. General recommendations for healthful eating and cooking habits include:  Eating meals and snacks regularly. Avoid going long periods of time without eating to lose weight.  Eating a diet that consists mainly of plant-based foods, such as fruits, vegetables, nuts, legumes, and whole grains.  Using low-heat cooking methods, such as baking, instead of high-heat cooking methods, such as deep frying.  Work with your dietitian to make sure you understand how to use the Nutrition Facts information on food labels. How can food affect me? Carbohydrates Carbohydrates affect your blood glucose level more than any other type of food. Your dietitian will help you determine how many carbohydrates to eat at each meal and teach you how to count carbohydrates. Counting carbohydrates is important to keep your blood glucose at a healthy level, especially if you are using insulin or taking certain medicines for diabetes mellitus. Alcohol Alcohol can cause sudden decreases in blood glucose (hypoglycemia), especially if you use insulin or take certain medicines for diabetes mellitus. Hypoglycemia can be a life-threatening condition. Symptoms of hypoglycemia (sleepiness, dizziness, and disorientation) are similar to symptoms of having too much alcohol. If your health care provider has given you approval to drink alcohol, do so in moderation and use the following guidelines:  Women should not have more than one drink per day, and men  should not have more than two drinks per day. One drink is equal to: ? 12 oz of beer. ? 5 oz of wine. ? 1 oz of hard liquor.  Do not drink on an empty stomach.  Keep yourself hydrated. Have water, diet soda, or unsweetened iced tea.  Regular soda, juice, and other mixers might contain a lot of carbohydrates and should be counted.  What foods are not recommended? As you make food choices, it is important to remember that all foods are not the same. Some foods have fewer nutrients per serving than other foods, even though they might have the same number of calories or carbohydrates. It is difficult to get your body what it needs when you eat foods with fewer nutrients. Examples of foods that you should avoid that are high in calories and carbohydrates but low in nutrients include:  Trans fats (most processed foods list trans fats on the Nutrition Facts label).  Regular soda.  Juice.  Candy.  Sweets, such as cake, pie, doughnuts, and cookies.  Fried foods.  What foods can I eat? Eat nutrient-rich foods, which will nourish your body and keep you healthy. The food you should eat also will depend on several factors, including:  The calories you need.  The medicines you take.  Your weight.  Your blood glucose level.  Your blood pressure level.  Your cholesterol level.  You should eat a variety of foods, including:  Protein. ? Lean cuts of meat. ? Proteins low in saturated fats, such as fish, egg whites, and beans. Avoid processed meats.  Fruits and vegetables. ? Fruits and vegetables that may help control blood glucose levels, such as apples,   mangoes, and yams.  Dairy products. ? Choose fat-free or low-fat dairy products, such as milk, yogurt, and cheese.  Grains, bread, pasta, and rice. ? Choose whole grain products, such as multigrain bread, whole oats, and brown rice. These foods may help control blood pressure.  Fats. ? Foods containing healthful fats, such as  nuts, avocado, olive oil, canola oil, and fish.  Does everyone with diabetes mellitus have the same meal plan? Because every person with diabetes mellitus is different, there is not one meal plan that works for everyone. It is very important that you meet with a dietitian who will help you create a meal plan that is just right for you. This information is not intended to replace advice given to you by your health care provider. Make sure you discuss any questions you have with your health care provider. Document Released: 08/19/2005 Document Revised: 04/29/2016 Document Reviewed: 10/19/2013 Elsevier Interactive Patient Education  2017 Elsevier Inc.  

## 2017-10-01 LAB — WOUND CULTURE

## 2017-10-03 ENCOUNTER — Other Ambulatory Visit (INDEPENDENT_AMBULATORY_CARE_PROVIDER_SITE_OTHER): Payer: Self-pay | Admitting: Physician Assistant

## 2017-10-03 DIAGNOSIS — L089 Local infection of the skin and subcutaneous tissue, unspecified: Principal | ICD-10-CM

## 2017-10-03 DIAGNOSIS — E11628 Type 2 diabetes mellitus with other skin complications: Secondary | ICD-10-CM

## 2017-10-03 MED ORDER — CIPROFLOXACIN HCL 500 MG PO TABS: 500.0000 mg | ORAL_TABLET | Freq: Two times a day (BID) | ORAL | 0 refills | Status: DC

## 2017-10-04 ENCOUNTER — Telehealth (INDEPENDENT_AMBULATORY_CARE_PROVIDER_SITE_OTHER): Payer: Self-pay | Admitting: Physician Assistant

## 2017-10-04 ENCOUNTER — Other Ambulatory Visit (INDEPENDENT_AMBULATORY_CARE_PROVIDER_SITE_OTHER): Payer: Self-pay | Admitting: Physician Assistant

## 2017-10-04 ENCOUNTER — Telehealth (INDEPENDENT_AMBULATORY_CARE_PROVIDER_SITE_OTHER): Payer: Self-pay

## 2017-10-04 DIAGNOSIS — L089 Local infection of the skin and subcutaneous tissue, unspecified: Principal | ICD-10-CM

## 2017-10-04 DIAGNOSIS — E11628 Type 2 diabetes mellitus with other skin complications: Secondary | ICD-10-CM

## 2017-10-04 MED ORDER — CIPROFLOXACIN HCL 500 MG PO TABS: 500.0000 mg | ORAL_TABLET | Freq: Two times a day (BID) | ORAL | 0 refills | Status: DC

## 2017-10-04 MED FILL — CIPROFLOXACIN HCL 500 MG TA: 500 | 10 days supply | Qty: 20 | Fill #0

## 2017-10-04 NOTE — Telephone Encounter (Signed)
Left patient a message informing of Rx for cipro being sent to pharmacy and to begin taking. Nat Christen, CMA

## 2017-10-04 NOTE — Telephone Encounter (Signed)
Left patient a message letting her know that Rx had been sent to [facility]. Nat Christen, CMA

## 2017-10-04 NOTE — Telephone Encounter (Signed)
I have reordered and sent to [facility]. Please let her know.

## 2017-10-04 NOTE — Telephone Encounter (Signed)
-----   Message from Clent Demark, PA-C sent at 10/03/2017  5:46 PM EDT ----- Please call Patient and have her take ciprofloxacin also. Sent to pharmacy [address].

## 2017-10-04 NOTE — Telephone Encounter (Signed)
Patient called requesting her medication to be change to [facility]. Thank you , please, call her

## 2017-10-04 NOTE — Telephone Encounter (Signed)
Please send cipro to [facility]. Nat Christen, CMA

## 2017-10-12 ENCOUNTER — Encounter (INDEPENDENT_AMBULATORY_CARE_PROVIDER_SITE_OTHER): Payer: Self-pay | Admitting: Physician Assistant

## 2017-10-12 ENCOUNTER — Ambulatory Visit (INDEPENDENT_AMBULATORY_CARE_PROVIDER_SITE_OTHER): Payer: Medicare Other | Admitting: Physician Assistant

## 2017-10-12 VITALS — BP 134/85 | HR 97 | Temp 98.1°F | Wt 203.0 lb

## 2017-10-12 DIAGNOSIS — F409 Phobic anxiety disorder, unspecified: Secondary | ICD-10-CM | POA: Diagnosis not present

## 2017-10-12 DIAGNOSIS — T7491XA Unspecified adult maltreatment, confirmed, initial encounter: Secondary | ICD-10-CM | POA: Diagnosis not present

## 2017-10-12 DIAGNOSIS — F5105 Insomnia due to other mental disorder: Secondary | ICD-10-CM | POA: Diagnosis not present

## 2017-10-12 DIAGNOSIS — F3181 Bipolar II disorder: Secondary | ICD-10-CM | POA: Diagnosis not present

## 2017-10-12 DIAGNOSIS — E1142 Type 2 diabetes mellitus with diabetic polyneuropathy: Secondary | ICD-10-CM | POA: Diagnosis not present

## 2017-10-12 MED ORDER — CLONAZEPAM 1 MG PO TABS
1.0000 mg | ORAL_TABLET | Freq: Every day | ORAL | 0 refills | Status: DC
Start: 2017-10-12 — End: 2018-10-17

## 2017-10-12 MED ORDER — VALPROIC ACID 250 MG PO CAPS
250.0000 mg | ORAL_CAPSULE | Freq: Four times a day (QID) | ORAL | 0 refills | Status: DC
Start: 2017-10-12 — End: 2018-10-17

## 2017-10-12 MED FILL — clonazePAM 1 MG TABS: 1 | 21 days supply | Qty: 21 | Fill #0

## 2017-10-12 NOTE — Patient Instructions (Signed)
Major Depressive Disorder, Adult Major depressive disorder (MDD) is a mental health condition. MDD often makes you feel sad, hopeless, or helpless. MDD can also cause symptoms in your body. MDD can affect your:  Work.  School.  Relationships.  Other normal activities.  MDD can range from mild to very bad. It may occur once (single episode MDD). It can also occur many times (recurrent MDD). The main symptoms of MDD often include:  Feeling sad, depressed, or irritable most of the time.  Loss of interest.  MDD symptoms also include:  Sleeping too much or too little.  Eating too much or too little.  A change in your weight.  Feeling tired (fatigue) or having low energy.  Feeling worthless.  Feeling guilty.  Trouble making decisions.  Trouble thinking clearly.  Thoughts of suicide or harming others.  Feeling weak.  Feeling agitated.  Keeping yourself from being around other people (isolation).  Follow these instructions at home: Activity  Do these things as told by your doctor: ? Go back to your normal activities. ? Exercise regularly. ? Spend time outdoors. Alcohol  Talk with your doctor about how alcohol can affect your antidepressant medicines.  Do not drink alcohol. Or, limit how much alcohol you drink. ? This means no more than 1 drink a day for nonpregnant women and 2 drinks a day for men. One drink equals one of these:  12 oz of beer.  5 oz of wine.  1 oz of hard liquor. General instructions  Take over-the-counter and prescription medicines only as told by your doctor.  Eat a healthy diet.  Get plenty of sleep.  Find activities that you enjoy. Make time to do them.  Think about joining a support group. Your doctor may be able to suggest a group for you.  Keep all follow-up visits as told by your doctor. This is important. Where to find more information:  [employer] on Mental Illness: ? www.nami.[place]: ? https://carter.com/  National Suicide Prevention Lifeline: ? (534)622-7313. This is free, 24-hour help. Contact a doctor if:  Your symptoms get worse.  You have new symptoms. Get help right away if:  You self-harm.  You see, hear, taste, smell, or feel things that are not present (hallucinate). If you ever feel like you may hurt yourself or others, or have thoughts about taking your own life, get help right away. You can go to your nearest emergency department or call:  Your local emergency services (911 in the [place]  A suicide crisis helpline, such as the National Suicide Prevention Lifeline: ? (580) 708-0149. This is open 24 hours a day.  This information is not intended to replace advice given to you by your health care provider. Make sure you discuss any questions you have with your health care provider. Document Released: 11/03/2015 Document Revised: 08/08/2016 Document Reviewed: 08/08/2016 Elsevier Interactive Patient Education  2017 [place] What is domestic violence? Domestic violence, also called intimate partner violence, can involve physical, emotional, psychological, sexual, and economic abuse by a current or former intimate partner. Stalking is also considered a type of domestic violence. Domestic violence can happen between people who are or were:  Married.  Dating.  Living together.  Abusers repeatedly act to maintain control and power over their partner. Physical abuse Physical abuse can include:  Slapping.  Hitting.  Kicking.  Punching.  Choking.  Pulling the victim's hair.  Damaging the victim's property.  Threatening or  hurting the victim with weapons.  Trapping the victim in his or her home.  Forcing the victim to use drugs or alcohol.  Emotional and psychological abuse Emotional and psychological abuse can include:  Threats.  Insults.  Isolation.  Humiliation.  Jealousy and  possessiveness.  Blame.  Withholding affection.  Intimidation.  Manipulation.  Limiting contact with friends and family.  Sexual abuse Sexual abuse can include:  Forcing sex.  Forcing sexual touching.  Hurting the victim during sex.  Forcing the victim to have sex with other people.  Giving the victim a sexually transmitted disease (STD) on purpose.  Economic abuse Economic abuse can include:  Controlling resources, such as money, food, transportation, a phone, or computer.  Stealing money from the victim or his or her family or friends.  Forbidding the victim to work.  Refusing to work or to contribute to the household.  Stalking Stalking can include:  Making repeated, unwanted phone calls, emails, or text messages.  Leaving cards, letters, flowers or other items the victim does not want.  Watching or following the victim from a distance.  Going to places where the victim does not want the abuser.  Entering the victim's home or car.  Damaging the victim's personal property.  What are some warning signs of domestic violence? Physical signs  Bruises.  Broken bones.  Burns or cuts.  Physical pain.  Head injury. Emotional and psychological signs  Crying.  Depression.  Hopelessness.  Desperation.  Trouble sleeping.  Fear of partner.  Anxiety.  Suicidal behavior.  Antisocial behavior.  Low self-esteem.  Fear of intimacy.  Flashbacks. Sexual signs  Bruising, swelling, or bleeding of the genital or rectal area.  Signs of a sexually transmitted infection, such as genital sores, warts, or discharge coming from the genital area.  Pain in the genital area.  Unintended pregnancy.  Problems with pregnancy, including delayed prenatal care and prematurity. Economic signs  Having little money or food.  Homelessness.  Asking for or borrowing money. What are common behaviors of those affected by domestic violence? Those affected  by domestic violence may:  Be late to work or other events.  Not show up to places as promised.  Have to let their partner know where they are and who they are with.  Be isolated or kept from seeing friends or family.  Make comments about their partner's temper or behavior.  Make excuses for their partner.  Engage in high-risk sexual behaviors.  Use drugs or alcohol.  Have unhealthy diet-related behaviors.  What are common feelings of those affected by domestic violence? Those affected by domestic violence may feel that:  They have to be careful not to say or do things that trigger their partner's anger.  They cannot do anything right.  They deserve to be treated badly.  They are overreacting to their partner's behavior or temper.  They cannot trust their own feelings.  They cannot trust other people.  They are trapped.  Their partner would take away their children.  They are emotionally drained or numb.  Their life is in danger.  They might have to kill their partner to survive.  Where can you get help? Domestic violence hotlines and websites If you do not feel safe searching for help online at home, use a computer at [facility] to access [employer]. Call 911 if you are in immediate danger or need medical help.  The [employer]. ? The 24-hour phone hotline is 873-397-5243 or 367-647-0295 (  TTY). ? The videophone is available Monday through Friday, 9 a.m. to 5 p.m. Call 302-169-7136. ? The website is http://thehotline.org  The National Sexual Assault Hotline. ? The 24-hour phone hotline is 289 057 8230. ? You can access the online hotline at http://www.dixon.info/  Shelters for victims of domestic violence If you are a victim of domestic violence, there are resources to help you find a temporary place for you and your children to live (shelter). The specific address of these shelters is often not known to the  public. Police Report assaults, threats, and stalking to the police. Counselors and counseling centers People who have been victims of domestic violence can benefit from counseling. Counseling can help you cope with difficult emotions and empower you to plan for your future safety. The topics you discuss with a counselor are private and confidential. Children of domestic violence victims also might need counseling to manage stress and anxiety. The court system You can work with a Chief Executive Officer or an advocate to get legal protection against an abuser. Protection includes restraining orders and private addresses. Crimes against you, such as assault, can also be prosecuted through the courts. Laws vary by state. This information is not intended to replace advice given to you by your health care provider. Make sure you discuss any questions you have with your health care provider. Document Released: 02/12/2004 Document Revised: 11/05/2016 Document Reviewed: 08/09/2014 Elsevier Interactive Patient Education  2018 [employer].
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
## 2017-10-19 MED FILL — VALPROIC ACID 250 MG CAP: 250 | 15 days supply | Qty: 60 | Fill #0

## 2017-11-22 ENCOUNTER — Encounter (HOSPITAL_BASED_OUTPATIENT_CLINIC_OR_DEPARTMENT_OTHER): Payer: Medicare Other | Attending: Surgery

## 2017-11-26 DIAGNOSIS — E109 Type 1 diabetes mellitus without complications: Secondary | ICD-10-CM | POA: Diagnosis not present

## 2017-12-25 ENCOUNTER — Other Ambulatory Visit: Payer: Self-pay

## 2017-12-25 ENCOUNTER — Ambulatory Visit (HOSPITAL_COMMUNITY)
Admission: EM | Admit: 2017-12-25 | Discharge: 2017-12-25 | Disposition: A | Discharge status: Home / Self Care | Payer: Medicare Other | Attending: Internal Medicine | Admitting: Internal Medicine

## 2017-12-25 ENCOUNTER — Encounter (HOSPITAL_COMMUNITY): Payer: Self-pay | Admitting: Emergency Medicine

## 2017-12-25 DIAGNOSIS — H6982 Other specified disorders of Eustachian tube, left ear: Secondary | ICD-10-CM | POA: Diagnosis not present

## 2017-12-25 MED ORDER — IPRATROPIUM BROMIDE 0.06 % NA SOLN: 2.0000 | Freq: Four times a day (QID) | NASAL | 0 refills | Status: DC

## 2017-12-25 MED ORDER — FLUTICASONE PROPIONATE 50 MCG/ACT NA SUSP: 2.0000 | Freq: Every day | NASAL | 0 refills | Status: DC

## 2017-12-25 NOTE — ED Triage Notes (Signed)
Pt c/o L ear fullness, states she cant hear that well with her L ear, states it feels like shes in a pool and its clogged. C/o pressure.

## 2017-12-25 NOTE — ED Provider Notes (Signed)
[facility]    CSN: [id number] Arrival date & time: 12/25/17  1244     History   Chief Complaint Chief Complaint  Patient presents with  . Ear Problem    HPI Sarah Long is a 47 y.o. female.   48 year old female comes in for left ear fullness x 3 days.  She has also noticed decreased hearing in that ear.  She states that she feels as if she is an appointment clogged.  Denies URI symptoms such as cough, congestion, sore throat.  Denies fever, chills, night sweats.  Denies ear pain.  Does use cotton swabs.  Denies recent travel, swimming.      Past Medical History:  Diagnosis Date  . Abnormal Pap smear 1998  . Abnormal vaginal bleeding 12/19/2011  . Bacterial infection   . Bipolar 1 disorder ([place])   . Candida vaginitis 07/2007  . Depression    recently added wellbutrin-has not taken yet for bipolar  . Diabetes in pregnancy   . Diabetes mellitus    nph 20U qam and qpm, regular with meals  . Fibroid   . Galactorrhea of right breast 2008  . H/O amenorrhea 07/2007  . H/O dizziness 10/14/11  . H/O dysmenorrhea 2010  . H/O menorrhagia 10/14/11  . H/O varicella   . Headache(784.0)   . Heavy vaginal bleeding due to contraceptive injection use 10/12/2011   Depo Provera  . Herpes   . HSV-2 infection 01/03/2009  . Hx: UTI (urinary tract infection) 2009  . Hypertension    on aldomet  . Increased BMI 2010  . Obesity 10/14/11  . Oligomenorrhea 07/2007  . Pelvic pain in female   . Preterm labor   . Trichomonas   . Yeast infection     Patient Active Problem List   Diagnosis Date Noted  . Blister of second toe of left foot 11/21/2016  . Diabetes type 2, uncontrolled ([place]) 10/21/2016  . Vaginal yeast infection 10/21/2016  . Swelling of left ring finger 10/21/2016  . Smoking 11/26/2013  . Irregular uterine bleeding 04/04/2012  . HYPERLIPIDEMIA 02/02/2007  . BIPOLAR DISORDER 02/02/2007  . HYPERTENSION, BENIGN SYSTEMIC 02/02/2007    Past Surgical History:    Procedure Laterality Date  . CESAREAN SECTION  1991    OB History    Gravida Para Term Preterm AB Living   _0 SAB TAB Ectopic Multiple Live Births   1 1 0 0 3       Home Medications    Prior to Admission medications   Medication Sig Start Date End Date Taking? Authorizing Provider  lisinopril (PRINIVIL,ZESTRIL) 40 MG tablet Take 1 tablet (40 mg total) by mouth daily. 03/07/17  Yes Clent Demark, PA-C  Adhesive Bandages (2ND SKIN BLISTER KIT) MISC 1 each by Does not apply route daily. Patient not taking: Reported on 09/28/2017 11/18/16   Boykin Nearing, MD  amLODipine (NORVASC) 10 MG tablet Take 1 tablet (10 mg total) by mouth daily. Patient not taking: Reported on 09/28/2017 03/07/17   Clent Demark, PA-C  aspirin 81 MG tablet Take 1 tablet (81 mg total) by mouth daily. Patient not taking: Reported on 09/28/2017 03/07/17   Clent Demark, PA-C  atorvastatin (LIPITOR) 40 MG tablet Take 1 tablet (40 mg total) by mouth daily. Patient not taking: Reported on 09/28/2017 03/07/17   Clent Demark, PA-C  Blood Glucose Monitoring Suppl (TRUE METRIX METER) w/Device KIT 1 each by Does not apply  route as needed. Patient not taking: Reported on 09/28/2017 03/07/17   Clent Demark, PA-C  cephALEXin (KEFLEX) 500 MG capsule Take 1 capsule (500 mg total) by mouth 4 (four) times daily. Patient not taking: Reported on 09/28/2017 09/21/17   Montine Circle, PA-C  ciprofloxacin (CIPRO) 500 MG tablet Take 1 tablet (500 mg total) by mouth 2 (two) times daily. 10/04/17   Clent Demark, PA-C  clonazePAM (KLONOPIN) 1 MG tablet Take 1 tablet (1 mg total) at bedtime by mouth. 10/12/17   Clent Demark, PA-C  diphenhydrAMINE (SOMINEX) 25 MG tablet Take 25 mg by mouth at bedtime as needed for itching, allergies or sleep.    [provider]  fluconazole (DIFLUCAN) 150 MG tablet Take 1 tablet (150 mg total) by mouth every 3 (three) days. Patient not taking: Reported  on 09/28/2017 03/07/17   Clent Demark, PA-C  fluticasone [facility]) 50 MCG/ACT nasal spray Place 2 sprays into both nostrils daily. 12/25/17   Yu, Amy V, PA-C  glucose blood (TRUE METRIX BLOOD GLUCOSE TEST) test strip 1 each by Other route every morning. Patient not taking: Reported on 09/28/2017 03/07/17   Clent Demark, PA-C  hydrochlorothiazide (HYDRODIURIL) 12.5 MG tablet Take 1 tablet (12.5 mg total) by mouth daily. Patient not taking: Reported on 09/28/2017 03/07/17   Clent Demark, PA-C  insulin glargine (LANTUS) 100 UNIT/ML injection Inject 0.6 mLs (60 Units total) into the skin at bedtime. 03/07/17   Clent Demark, PA-C  Insulin Pen Needle (B-D ULTRAFINE III SHORT PEN) 31G X 8 MM MISC 1 application by Does not apply route daily. Patient not taking: Reported on 09/28/2017 03/07/17   Clent Demark, PA-C  Insulin Syringe-Needle U-100 (TRUEPLUS INSULIN SYRINGE) 31G X 5/16" 1 ML MISC Use as directed Patient not taking: Reported on 09/28/2017 03/08/17   Clent Demark, PA-C  ipratropium (ATROVENT) 0.06 % nasal spray Place 2 sprays into both nostrils 4 (four) times daily. 12/25/17   Tasia Catchings, Amy V, PA-C  metFORMIN (GLUCOPHAGE) 1000 MG tablet Take 1 tablet (1,000 mg total) by mouth 2 (two) times daily with a meal. Patient not taking: Reported on 09/28/2017 03/07/17   Clent Demark, PA-C  metroNIDAZOLE (FLAGYL) 500 MG tablet Take 1 tablet (500 mg total) by mouth 2 (two) times daily. Patient not taking: Reported on 09/28/2017 03/21/17   Emily Filbert, MD  ondansetron (ZOFRAN) 4 MG tablet Take 1 tablet (4 mg total) by mouth every 6 (six) hours. Patient not taking: Reported on 09/28/2017 09/21/17   Montine Circle, PA-C  phenazopyridine (PYRIDIUM) 95 MG tablet Take 1 tablet (95 mg total) by mouth 3 (three) times daily as needed for pain. Patient not taking: Reported on 09/28/2017 09/21/17   Montine Circle, PA-C  TRUEPLUS LANCETS 28G MISC 1 each by Does not apply route every  morning. Patient not taking: Reported on 09/28/2017 10/21/16   Boykin Nearing, MD  valproic acid (DEPAKENE) 250 MG capsule Take 1 capsule (250 mg total) 4 (four) times daily by mouth. 10/12/17   Clent Demark, PA-C    Family History Family History  Problem Relation Age of Onset  . Hypertension Mother   . Diabetes Mother   . Heart disease Mother   . Hypertension Father   . Diabetes Father   . Heart disease Father   . Stroke Maternal Grandfather   . Other Neg Hx     Social History Social History   Tobacco Use  . Smoking status: Current Every  Day Smoker    Packs/day: 0.50    Years: 20.00    Pack years: 10.00    Types: Cigarettes  . Smokeless tobacco: Never Used  Substance Use Topics  . Alcohol use: No  . Drug use: No     Allergies   Strawberry extract and Sulfa antibiotics   Review of Systems Review of Systems  Reason unable to perform ROS: See HPI as above.     Physical Exam Triage Vital Signs ED Triage Vitals [12/25/17 1401]  Enc Vitals Group     BP (!) 158/89     Pulse Rate 97     Resp 18     Temp 98.5 F (36.9 C)     Temp src      SpO2 98 %     Weight      Height      Head Circumference      Peak Flow      Pain Score 6     Pain Loc      Pain Edu?      Excl. in [place]?    No data found.  Updated Vital Signs BP (!) 158/89   Pulse 97   Temp 98.5 F (36.9 C)   Resp 18   LMP 11/24/2017   SpO2 98%   Physical Exam  Constitutional: She is oriented to person, place, and time. She appears well-developed and well-nourished. No distress.  HENT:  Head: Normocephalic and atraumatic.  Right Ear: Tympanic membrane, external ear and ear canal normal. Tympanic membrane is not erythematous and not bulging.  Left Ear: External ear and ear canal normal. Tympanic membrane is not erythematous and not bulging. A middle ear effusion is present.  Nose: Mucosal edema and rhinorrhea present. Right sinus exhibits no maxillary sinus tenderness and no frontal  sinus tenderness. Left sinus exhibits no maxillary sinus tenderness and no frontal sinus tenderness.  Mouth/Throat: Uvula is midline, oropharynx is clear and moist and mucous membranes are normal.  Eyes: Conjunctivae are normal. Pupils are equal, round, and reactive to light.  Neck: Normal range of motion. Neck supple.  Cardiovascular: Normal rate, regular rhythm and normal heart sounds. Exam reveals no gallop and no friction rub.  No murmur heard. Pulmonary/Chest: Effort normal and breath sounds normal. She has no decreased breath sounds. She has no wheezes. She has no rhonchi. She has no rales.  Lymphadenopathy:    She has no cervical adenopathy.  Neurological: She is alert and oriented to person, place, and time.  Skin: Skin is warm and dry.  Psychiatric: She has a normal mood and affect. Her behavior is normal. Judgment normal.     UC Treatments / Results  Labs (all labs ordered are listed, but only abnormal results are displayed) Labs Reviewed - No data to display  EKG  EKG Interpretation None       Radiology No results found.  Procedures Procedures (including critical care time)  Medications Ordered in UC Medications - No data to display   Initial Impression / Assessment and Plan / UC Course  I have reviewed the triage vital signs and the nursing notes.  Pertinent labs & imaging results that were available during my care of the patient were reviewed by me and considered in my medical decision making (see chart for details).    Discussed possible eustachian tube dysfunction causing symptoms.  Symptomatic treatment discussed.  Return precautions given.  Patient to follow-up with PCP/ENT if symptoms not improving.  Patient expresses understanding  and agrees to plan.  Final Clinical Impressions(s) / UC Diagnoses   Final diagnoses:  Dysfunction of left eustachian tube    ED Discharge Orders        Ordered    fluticasone (FLONASE) 50 MCG/ACT nasal spray  Daily      12/25/17 1510    ipratropium (ATROVENT) 0.06 % nasal spray  4 times daily     12/25/17 1510         [address], [place] 12/25/17 1515

## 2017-12-25 NOTE — Discharge Instructions (Signed)
Start atovent and flonase for nasal congestion/eustachian tube dysfunction. Zyrtec can also help with nasal congestion. If symptoms do not resolve, follow up with PCP/ENT for further evaluation.

## 2018-01-06 DIAGNOSIS — H9122 Sudden idiopathic hearing loss, left ear: Secondary | ICD-10-CM | POA: Insufficient documentation

## 2018-01-06 DIAGNOSIS — H9042 Sensorineural hearing loss, unilateral, left ear, with unrestricted hearing on the contralateral side: Secondary | ICD-10-CM | POA: Diagnosis not present

## 2018-01-06 MED FILL — predniSONE 10 MG TABS: 10 | 17 days supply | Qty: 72 | Fill #0

## 2018-02-25 DIAGNOSIS — E109 Type 1 diabetes mellitus without complications: Secondary | ICD-10-CM | POA: Diagnosis not present

## 2018-04-08 ENCOUNTER — Other Ambulatory Visit: Payer: Self-pay

## 2018-04-08 ENCOUNTER — Encounter (HOSPITAL_COMMUNITY): Payer: Self-pay | Admitting: Emergency Medicine

## 2018-04-08 ENCOUNTER — Emergency Department (HOSPITAL_COMMUNITY)
Admission: EM | Admit: 2018-04-08 | Discharge: 2018-04-08 | Disposition: A | Discharge status: Home / Self Care | Payer: Medicare Other | Attending: Emergency Medicine | Admitting: Emergency Medicine

## 2018-04-08 DIAGNOSIS — I10 Essential (primary) hypertension: Secondary | ICD-10-CM | POA: Diagnosis not present

## 2018-04-08 DIAGNOSIS — R109 Unspecified abdominal pain: Secondary | ICD-10-CM

## 2018-04-08 DIAGNOSIS — E119 Type 2 diabetes mellitus without complications: Secondary | ICD-10-CM | POA: Diagnosis not present

## 2018-04-08 DIAGNOSIS — Z794 Long term (current) use of insulin: Secondary | ICD-10-CM | POA: Insufficient documentation

## 2018-04-08 DIAGNOSIS — Z7982 Long term (current) use of aspirin: Secondary | ICD-10-CM | POA: Insufficient documentation

## 2018-04-08 DIAGNOSIS — Z79899 Other long term (current) drug therapy: Secondary | ICD-10-CM | POA: Diagnosis not present

## 2018-04-08 DIAGNOSIS — M549 Dorsalgia, unspecified: Secondary | ICD-10-CM | POA: Diagnosis not present

## 2018-04-08 DIAGNOSIS — R3915 Urgency of urination: Secondary | ICD-10-CM | POA: Diagnosis not present

## 2018-04-08 DIAGNOSIS — R05 Cough: Secondary | ICD-10-CM | POA: Diagnosis not present

## 2018-04-08 DIAGNOSIS — E1142 Type 2 diabetes mellitus with diabetic polyneuropathy: Secondary | ICD-10-CM

## 2018-04-08 LAB — COMPREHENSIVE METABOLIC PANEL
ALK PHOS: 64 U/L (ref 38–126)
ALT: 33 U/L (ref 14–54)
ANION GAP: 12 (ref 5–15)
AST: 26 U/L (ref 15–41)
Albumin: 3.4 g/dL — ABNORMAL LOW (ref 3.5–5.0)
BILIRUBIN TOTAL: 0.6 mg/dL (ref 0.3–1.2)
BUN: 5 mg/dL — ABNORMAL LOW (ref 6–20)
CO2: 21 mmol/L — ABNORMAL LOW (ref 22–32)
CREATININE: 0.7 mg/dL (ref 0.44–1.00)
Calcium: 8.9 mg/dL (ref 8.9–10.3)
Chloride: 103 mmol/L (ref 101–111)
GLUCOSE: 285 mg/dL — AB (ref 65–99)
Potassium: 3.9 mmol/L (ref 3.5–5.1)
SODIUM: 136 mmol/L (ref 135–145)
Total Protein: 7 g/dL (ref 6.5–8.1)

## 2018-04-08 LAB — URINALYSIS, ROUTINE W REFLEX MICROSCOPIC
BILIRUBIN URINE: NEGATIVE
Bacteria, UA: NONE SEEN
Glucose, UA: >=500 mg/dL — AB
Ketones, ur: NEGATIVE mg/dL
Leukocytes, UA: NEGATIVE
Nitrite: NEGATIVE
PH: 5 (ref 5.0–8.0)
Protein, ur: 100 mg/dL — AB
RBC / HPF: >50 RBC/hpf — ABNORMAL HIGH (ref 0–5)
SPECIFIC GRAVITY, URINE: 1.028 (ref 1.005–1.030)

## 2018-04-08 LAB — CBC
HCT: 37.5 % (ref 36.0–46.0)
Hemoglobin: 12.6 g/dL (ref 12.0–15.0)
MCH: 29.6 pg (ref 26.0–34.0)
MCHC: 33.6 g/dL (ref 30.0–36.0)
MCV: 88.2 fL (ref 78.0–100.0)
PLATELETS: 260 10*3/uL (ref 150–400)
RBC: 4.25 MIL/uL (ref 3.87–5.11)
RDW: 13.8 % (ref 11.5–15.5)
WBC: 7.8 10*3/uL (ref 4.0–10.5)

## 2018-04-08 LAB — I-STAT BETA HCG BLOOD, ED (MC, WL, AP ONLY): I-stat hCG, quantitative: <5 m[IU]/mL (ref <5)

## 2018-04-08 LAB — LIPASE, BLOOD: Lipase: 43 U/L (ref 11–51)

## 2018-04-08 MED ORDER — KETOROLAC TROMETHAMINE 15 MG/ML IJ SOLN: 15.0000 mg | Freq: Once | INTRAMUSCULAR | Status: DC

## 2018-04-08 MED ORDER — METFORMIN HCL 1000 MG PO TABS: 1000.0000 mg | ORAL_TABLET | Freq: Two times a day (BID) | ORAL | 0 refills | Status: DC

## 2018-04-08 MED ORDER — KETOROLAC TROMETHAMINE 15 MG/ML IJ SOLN
30.0000 mg | Freq: Once | INTRAMUSCULAR | Status: AC
  Administered 2018-04-08: 30 mg via INTRAMUSCULAR
  Filled 2018-04-08: qty 2

## 2018-04-08 NOTE — ED Triage Notes (Signed)
Pt. Stated, Sarah Long had left side pain for a couple of days and the last time I had this I had a UTI

## 2018-04-08 NOTE — ED Provider Notes (Signed)
[place] EMERGENCY DEPARTMENT Provider Note   CSN: [id number] Arrival date & time: 04/08/18  0806   History   Chief Complaint Chief Complaint  Patient presents with  . Abdominal Pain  . Flank Pain    HPI Sarah Long is a 47 y.o. female with a past medical history of hypertension, hyperlipidemia, uncontrolled diabetes, bipolar disorder who presented to the ED with complaints of left-sided flank and abdominal pain.  Reports 2-day history of left flank and back pain which started upon waking with no inciting incident or injury.  It is constant pain is worse with movement, not relieved by Aleve, aspirin, heating pad.  She notes some nausea this morning, and has had a recent cough.  She denies fevers, shortness of breath, diarrhea, abdominal pain.  Also reports increased urinary frequency and a feeling of urgency but no dysuria.  She denies hematuria but does note that she is on second day of her menstrual cycle.  Also reports yeast infection prior to menstrual period 3-4 days ago with vaginal itching and discharge which has resolved.  She is not currently taking any medications for her chronic medical problems.  Past Medical History:  Diagnosis Date  . Abnormal Pap smear 1998  . Abnormal vaginal bleeding 12/19/2011  . Bacterial infection   . Bipolar 1 disorder ([place])   . Candida vaginitis 07/2007  . Depression    recently added wellbutrin-has not taken yet for bipolar  . Diabetes in pregnancy   . Diabetes mellitus    nph 20U qam and qpm, regular with meals  . Fibroid   . Galactorrhea of right breast 2008  . H/O amenorrhea 07/2007  . H/O dizziness 10/14/11  . H/O dysmenorrhea 2010  . H/O menorrhagia 10/14/11  . H/O varicella   . Headache(784.0)   . Heavy vaginal bleeding due to contraceptive injection use 10/12/2011   Depo Provera  . Herpes   . HSV-2 infection 01/03/2009  . Hx: UTI (urinary tract infection) 2009  . Hypertension    on aldomet  . Increased BMI  2010  . Obesity 10/14/11  . Oligomenorrhea 07/2007  . Pelvic pain in female   . Preterm labor   . Trichomonas   . Yeast infection     Patient Active Problem List   Diagnosis Date Noted  . Blister of second toe of left foot 11/21/2016  . Diabetes type 2, uncontrolled ([place]) 10/21/2016  . Vaginal yeast infection 10/21/2016  . Swelling of left ring finger 10/21/2016  . Smoking 11/26/2013  . Irregular uterine bleeding 04/04/2012  . HYPERLIPIDEMIA 02/02/2007  . BIPOLAR DISORDER 02/02/2007  . HYPERTENSION, BENIGN SYSTEMIC 02/02/2007    Past Surgical History:  Procedure Laterality Date  . CESAREAN SECTION  1991     OB History    Gravida  6   Para  4   Term  1   Preterm  3   AB  2   Living  4     SAB  1   TAB  1   Ectopic  0   Multiple  0   Live Births  3            Home Medications    Prior to Admission medications   Medication Sig Start Date End Date Taking? Authorizing Provider  cetirizine (ZYRTEC) 10 MG tablet Take 10 mg by mouth daily.   Yes [provider]  fluticasone (FLONASE) 50 MCG/ACT nasal spray Place 2 sprays into both nostrils daily. 12/25/17  Yes Yu, Amy V, PA-C  naproxen sodium (ALEVE) 220 MG tablet Take 220-440 mg by mouth as needed (pain).   Yes [provider]  amLODipine (NORVASC) 10 MG tablet Take 1 tablet (10 mg total) by mouth daily. Patient not taking: Reported on 09/28/2017 03/07/17   Clent Demark, PA-C  aspirin 81 MG tablet Take 1 tablet (81 mg total) by mouth daily. Patient not taking: Reported on 09/28/2017 03/07/17   Clent Demark, PA-C  atorvastatin (LIPITOR) 40 MG tablet Take 1 tablet (40 mg total) by mouth daily. Patient not taking: Reported on 09/28/2017 03/07/17   Clent Demark, PA-C  Blood Glucose Monitoring Suppl (TRUE METRIX METER) w/Device KIT 1 each by Does not apply route as needed. Patient not taking: Reported on 09/28/2017 03/07/17   Clent Demark, PA-C  clonazePAM (KLONOPIN) 1 MG  tablet Take 1 tablet (1 mg total) at bedtime by mouth. Patient not taking: Reported on 04/08/2018 10/12/17   Clent Demark, PA-C  glucose blood (TRUE METRIX BLOOD GLUCOSE TEST) test strip 1 each by Other route every morning. Patient not taking: Reported on 09/28/2017 03/07/17   Clent Demark, PA-C  hydrochlorothiazide (HYDRODIURIL) 12.5 MG tablet Take 1 tablet (12.5 mg total) by mouth daily. Patient not taking: Reported on 09/28/2017 03/07/17   Clent Demark, PA-C  insulin glargine (LANTUS) 100 UNIT/ML injection Inject 0.6 mLs (60 Units total) into the skin at bedtime. Patient not taking: Reported on 04/08/2018 03/07/17   Clent Demark, PA-C  Insulin Pen Needle (B-D ULTRAFINE III SHORT PEN) 31G X 8 MM MISC 1 application by Does not apply route daily. Patient not taking: Reported on 09/28/2017 03/07/17   Clent Demark, PA-C  Insulin Syringe-Needle U-100 (TRUEPLUS INSULIN SYRINGE) 31G X 5/16" 1 ML MISC Use as directed Patient not taking: Reported on 09/28/2017 03/08/17   Clent Demark, PA-C  ipratropium (ATROVENT) 0.06 % nasal spray Place 2 sprays into both nostrils 4 (four) times daily. Patient not taking: Reported on 04/08/2018 12/25/17   Ok Edwards, PA-C  lisinopril (PRINIVIL,ZESTRIL) 40 MG tablet Take 1 tablet (40 mg total) by mouth daily. Patient not taking: Reported on 04/08/2018 03/07/17   Clent Demark, PA-C  metFORMIN (GLUCOPHAGE) 1000 MG tablet Take 1 tablet (1,000 mg total) by mouth 2 (two) times daily with a meal. 04/08/18   Tawny Asal, MD  TRUEPLUS LANCETS 28G MISC 1 each by Does not apply route every morning. Patient not taking: Reported on 09/28/2017 10/21/16   Boykin Nearing, MD  valproic acid (DEPAKENE) 250 MG capsule Take 1 capsule (250 mg total) 4 (four) times daily by mouth. Patient not taking: Reported on 04/08/2018 10/12/17   Clent Demark, PA-C  medroxyPROGESTERone (DEPO-PROVERA) 150 MG/ML injection Inject 1 mL (150 mg total) into the muscle every 3 (three)  months. 07/23/11 04/29/12  Crawford Givens, MD    Family History Family History  Problem Relation Age of Onset  . Hypertension Mother   . Diabetes Mother   . Heart disease Mother   . Hypertension Father   . Diabetes Father   . Heart disease Father   . Stroke Maternal Grandfather   . Other Neg Hx     Social History Social History   Tobacco Use  . Smoking status: Current Every Day Smoker    Packs/day: 0.50    Years: 20.00    Pack years: 10.00    Types: Cigarettes  . Smokeless tobacco: Never Used  Substance Use Topics  .  Alcohol use: No  . Drug use: No     Allergies   Strawberry extract and Sulfa antibiotics   Review of Systems Review of Systems  Constitutional: Negative for chills and fever.  Respiratory: Positive for cough. Negative for shortness of breath.   Cardiovascular: Negative for leg swelling.  Gastrointestinal: Negative for abdominal pain.  Genitourinary: Positive for urgency. Negative for difficulty urinating, dysuria and hematuria.  Musculoskeletal: Positive for back pain. Negative for gait problem.     Physical Exam Updated Vital Signs BP 133/86   Pulse 87   Temp 99.4 F (37.4 C) (Oral)   Resp 16   Ht 5' 4" (1.626 m)   Wt 90.7 kg (200 lb)   LMP 04/07/2018   SpO2 100%   BMI 34.33 kg/m   General: Resting on ED stretcher comfortably, no acute distress Head: Normocephalic, atraumatic  Eyes: PERRL EOMI ENT: moist mucous membranes, no pharyngeal exudate CV: Regular rate and rhythm, S1, S2 Resp: Clear breath sounds bilaterally without wheezes, crackles, normal work of breathing, no distress  Abd: Soft, +BS,  nontender to palpation.  Mild tenderness to palpation to left flank and left paraspinal musculature, CVA (though not isolated to CVA tenderness. Extr: No lower extremity edema, good peripheral pulses Neuro: Alert and oriented x3, no gross deficit Skin: Warm, dry      ED Treatments / Results  Labs (all labs ordered are listed, but only  abnormal results are displayed) Labs Reviewed  COMPREHENSIVE METABOLIC PANEL - Abnormal; Notable for the following components:      Result Value   CO2 21 (*)    Glucose, Bld 285 (*)    BUN 5 (*)    Albumin 3.4 (*)    All other components within normal limits  URINALYSIS, ROUTINE W REFLEX MICROSCOPIC - Abnormal; Notable for the following components:   APPearance HAZY (*)    Glucose, UA >=500 (*)    Hgb urine dipstick LARGE (*)    Protein, ur 100 (*)    RBC / HPF >50 (*)    All other components within normal limits  LIPASE, BLOOD  CBC  I-STAT BETA HCG BLOOD, ED ([facility], [facility], [facility] ONLY)    EKG None  Radiology No results found.  Medications Ordered in ED Medications  ketorolac (TORADOL) 15 MG/ML injection 30 mg (30 mg Intramuscular Given 04/08/18 0952)     Initial Impression / Assessment and Plan / ED Course  I have reviewed the triage vital signs and the nursing notes.  Pertinent labs & imaging results that were available during my care of the patient were reviewed by me and considered in my medical decision making (see chart for details).  47 year old female with uncontrolled diabetes presenting with left flank and back pain.  Pain is constant, worsens with movement and palpation, does have CVA tenderness but also tenderness to left paraspinal musculature, left flank.  No radiation to groin or inguinal area and no hematuria prior to onset of menses.  He reported urinary frequency and urgency likely related to glucosuria in the setting of no diabetes medications.  U/a negative for nitrite and leukocytes, she is afebrile with no leukocytosis-unlikely to be urinary tract infection. Recent CT abd/pelvis also reassuring. Does have blood in urine but likely to be due to menstrual period rather than renal calculi. Etiology of pain more likely to be musculoskeletal, potentially menses related given similar time of onset.  Provided IM Toradol for pain relief, will treat supportively as outpatient  with nsaids, tylenol.  Pt needs to f/u with PCP for improved control of her co-morbidities, as well as ensuring resolution of current sx. Counseled regarding this plan, pt agreeable.   Final Clinical Impressions(s) / ED Diagnoses   Final diagnoses:  Left flank pain    ED Discharge Orders        Ordered    metFORMIN (GLUCOPHAGE) 1000 MG tablet  2 times daily with meals     04/08/18 1011       Tawny Asal, MD 04/08/18 1011    Lajean Saver, MD 04/08/18 1530

## 2018-04-08 NOTE — Discharge Instructions (Addendum)
Nice to meet you Sarah Long.  Your urine and bloodwork are reassuring that there is not an infection or other serious cause of your pain. It may be related to your menstrual cycle or a muscle strain in that area of your back/flank. You can take ibuprofen (800 mg up to three times a day) and tylenol (1000 mg up to four times a day), as well as continuing to try ice/heat to relieve the pain. Your urgency and frequency of urinating is likely due to your blood sugar being high. We provided a refill for metformin that you were prescribed which is on the $4 list at [facility]. It will be important to follow up with your primary care doctor and try to take your recommended medications and insulin to improve your blood sugar and overall health.

## 2018-05-27 DIAGNOSIS — E109 Type 1 diabetes mellitus without complications: Secondary | ICD-10-CM | POA: Diagnosis not present

## 2018-07-03 ENCOUNTER — Other Ambulatory Visit: Payer: Self-pay

## 2018-07-03 ENCOUNTER — Encounter (HOSPITAL_COMMUNITY): Payer: Self-pay | Admitting: Emergency Medicine

## 2018-07-03 ENCOUNTER — Ambulatory Visit (HOSPITAL_COMMUNITY)
Admission: EM | Admit: 2018-07-03 | Discharge: 2018-07-03 | Disposition: A | Discharge status: Home / Self Care | Payer: Medicare Other | Attending: Family Medicine | Admitting: Family Medicine

## 2018-07-03 DIAGNOSIS — T148XXA Other injury of unspecified body region, initial encounter: Secondary | ICD-10-CM

## 2018-07-03 DIAGNOSIS — I10 Essential (primary) hypertension: Secondary | ICD-10-CM

## 2018-07-03 DIAGNOSIS — M542 Cervicalgia: Secondary | ICD-10-CM | POA: Diagnosis not present

## 2018-07-03 DIAGNOSIS — S161XXA Strain of muscle, fascia and tendon at neck level, initial encounter: Secondary | ICD-10-CM

## 2018-07-03 MED ORDER — DICLOFENAC SODIUM 1 % TD GEL: 2.0000 g | Freq: Four times a day (QID) | TRANSDERMAL | 0 refills | Status: DC

## 2018-07-03 MED ORDER — MELOXICAM 7.5 MG PO TABS: 7.5000 mg | ORAL_TABLET | Freq: Every day | ORAL | 0 refills | Status: DC

## 2018-07-03 MED FILL — MELOXICAM 7.5 MG TABLET: 7.5 | 15 days supply | Qty: 15 | Fill #0

## 2018-07-03 NOTE — ED Provider Notes (Signed)
[place]    CSN: [id number] Arrival date & time: 07/03/18  9563     History   Chief Complaint Chief Complaint  Patient presents with  . Neck Pain    HPI Sarah Long is a 47 y.o. female.   46 year old female comes in for 4-day history of left neck pain.  States she woke up with the symptoms, denies injury/trauma.  Left neck pain is intermittent, worsened by turning the head to the left.  Occasionally, pain can radiate to the shoulder and chest.  Denies numbness, tingling.  Denies loss of grip strength.  Denies fever, chills, night sweats.  Denies nausea, vomiting.  She has been taking Advil 400 mg twice daily with good relief.  However, given pain comes back after Advil wears off, came in for evaluation.  Patient with history of hypertension, does have medication at home, but has not been taking it.  She was hypertensive at triage, denies current chest pain.  Denies shortness of breath, palpitation, nausea, vomiting, diaphoresis.  Denies weakness, dizziness, orthopnea. Denies headache, blurry vision.      Past Medical History:  Diagnosis Date  . Abnormal Pap smear 1998  . Abnormal vaginal bleeding 12/19/2011  . Bacterial infection   . Bipolar 1 disorder ([place])   . Candida vaginitis 07/2007  . Depression    recently added wellbutrin-has not taken yet for bipolar  . Diabetes in pregnancy   . Diabetes mellitus    nph 20U qam and qpm, regular with meals  . Fibroid   . Galactorrhea of right breast 2008  . H/O amenorrhea 07/2007  . H/O dizziness 10/14/11  . H/O dysmenorrhea 2010  . H/O menorrhagia 10/14/11  . H/O varicella   . Headache(784.0)   . Heavy vaginal bleeding due to contraceptive injection use 10/12/2011   Depo Provera  . Herpes   . HSV-2 infection 01/03/2009  . Hx: UTI (urinary tract infection) 2009  . Hypertension    on aldomet  . Increased BMI 2010  . Obesity 10/14/11  . Oligomenorrhea 07/2007  . Pelvic pain in female   . Preterm labor   .  Trichomonas   . Yeast infection     Patient Active Problem List   Diagnosis Date Noted  . Blister of second toe of left foot 11/21/2016  . Diabetes type 2, uncontrolled ([place]) 10/21/2016  . Vaginal yeast infection 10/21/2016  . Swelling of left ring finger 10/21/2016  . Smoking 11/26/2013  . Irregular uterine bleeding 04/04/2012  . HYPERLIPIDEMIA 02/02/2007  . BIPOLAR DISORDER 02/02/2007  . HYPERTENSION, BENIGN SYSTEMIC 02/02/2007    Past Surgical History:  Procedure Laterality Date  . CESAREAN SECTION  1991    OB History    Gravida  6   Para  4   Term  1   Preterm  3   AB  2   Living  4     SAB  1   TAB  1   Ectopic  0   Multiple  0   Live Births  3            Home Medications    Prior to Admission medications   Medication Sig Start Date End Date Taking? Authorizing Provider  amLODipine (NORVASC) 10 MG tablet Take 1 tablet (10 mg total) by mouth daily. Patient not taking: Reported on 09/28/2017 03/07/17   Clent Demark, PA-C  aspirin 81 MG tablet Take 1 tablet (81 mg total) by mouth daily. Patient not taking:  Reported on 09/28/2017 03/07/17   Clent Demark, PA-C  atorvastatin (LIPITOR) 40 MG tablet Take 1 tablet (40 mg total) by mouth daily. Patient not taking: Reported on 09/28/2017 03/07/17   Clent Demark, PA-C  Blood Glucose Monitoring Suppl (TRUE METRIX METER) w/Device KIT 1 each by Does not apply route as needed. Patient not taking: Reported on 09/28/2017 03/07/17   Clent Demark, PA-C  cetirizine (ZYRTEC) 10 MG tablet Take 10 mg by mouth daily.    [provider]  clonazePAM (KLONOPIN) 1 MG tablet Take 1 tablet (1 mg total) at bedtime by mouth. Patient not taking: Reported on 04/08/2018 10/12/17   Clent Demark, PA-C  diclofenac sodium (VOLTAREN) 1 % GEL Apply 2 g topically 4 (four) times daily. 07/03/18   Tasia Catchings, Bellatrix Devonshire V, PA-C  fluticasone (FLONASE) 50 MCG/ACT nasal spray Place 2 sprays into both nostrils daily. 12/25/17   Adair Lauderback,  Yaasir Menken V, PA-C  glucose blood (TRUE METRIX BLOOD GLUCOSE TEST) test strip 1 each by Other route every morning. Patient not taking: Reported on 09/28/2017 03/07/17   Clent Demark, PA-C  hydrochlorothiazide (HYDRODIURIL) 12.5 MG tablet Take 1 tablet (12.5 mg total) by mouth daily. Patient not taking: Reported on 09/28/2017 03/07/17   Clent Demark, PA-C  insulin glargine (LANTUS) 100 UNIT/ML injection Inject 0.6 mLs (60 Units total) into the skin at bedtime. Patient not taking: Reported on 04/08/2018 03/07/17   Clent Demark, PA-C  Insulin Pen Needle (B-D ULTRAFINE III SHORT PEN) 31G X 8 MM MISC 1 application by Does not apply route daily. Patient not taking: Reported on 09/28/2017 03/07/17   Clent Demark, PA-C  Insulin Syringe-Needle U-100 (TRUEPLUS INSULIN SYRINGE) 31G X 5/16" 1 ML MISC Use as directed Patient not taking: Reported on 09/28/2017 03/08/17   Clent Demark, PA-C  ipratropium (ATROVENT) 0.06 % nasal spray Place 2 sprays into both nostrils 4 (four) times daily. Patient not taking: Reported on 04/08/2018 12/25/17   Ok Edwards, PA-C  Lancet Devices (ADJUSTABLE LANCING DEVICE) MISC  05/08/18   [provider]  lisinopril (PRINIVIL,ZESTRIL) 40 MG tablet Take 1 tablet (40 mg total) by mouth daily. Patient not taking: Reported on 04/08/2018 03/07/17   Clent Demark, PA-C  meloxicam (MOBIC) 7.5 MG tablet Take 1 tablet (7.5 mg total) by mouth daily. 07/03/18   Tasia Catchings, Elener Custodio V, PA-C  metFORMIN (GLUCOPHAGE) 1000 MG tablet Take 1 tablet (1,000 mg total) by mouth 2 (two) times daily with a meal. 04/08/18   Tawny Asal, MD  naproxen sodium (ALEVE) 220 MG tablet Take 220-440 mg by mouth as needed (pain).    [provider]  TRUEPLUS LANCETS 28G MISC 1 each by Does not apply route every morning. Patient not taking: Reported on 09/28/2017 10/21/16   Boykin Nearing, MD  valproic acid (DEPAKENE) 250 MG capsule Take 1 capsule (250 mg total) 4 (four) times daily by mouth. Patient not  taking: Reported on 04/08/2018 10/12/17   Clent Demark, PA-C    Family History Family History  Problem Relation Age of Onset  . Hypertension Mother   . Diabetes Mother   . Heart disease Mother   . Hypertension Father   . Diabetes Father   . Heart disease Father   . Stroke Maternal Grandfather   . Other Neg Hx     Social History Social History   Tobacco Use  . Smoking status: Current Every Day Smoker    Packs/day: 0.50    Years:  20.00    Pack years: 10.00    Types: Cigarettes  . Smokeless tobacco: Never Used  Substance Use Topics  . Alcohol use: No  . Drug use: No     Allergies   Strawberry extract and Sulfa antibiotics   Review of Systems Review of Systems  Reason unable to perform ROS: See HPI as above.     Physical Exam Triage Vital Signs ED Triage Vitals  Enc Vitals Group     BP 07/03/18 0856 (!) 189/100     Pulse Rate 07/03/18 0856 91     Resp --      Temp 07/03/18 0856 98.5 F (36.9 C)     Temp Source 07/03/18 0856 Oral     SpO2 07/03/18 0856 99 %     Weight --      Height --      Head Circumference --      Peak Flow --      Pain Score 07/03/18 0854 8     Pain Loc --      Pain Edu? --      Excl. in [place]? --    No data found.  Updated Vital Signs BP (!) 189/100 (BP Location: Right Arm)   Pulse 91   Temp 98.5 F (36.9 C) (Oral)   LMP 06/05/2018 (Approximate)   SpO2 99%   Physical Exam  Constitutional: She is oriented to person, place, and time. She appears well-developed and well-nourished. No distress.  HENT:  Head: Normocephalic and atraumatic.  Eyes: Pupils are equal, round, and reactive to light. Conjunctivae are normal.  Neck: Normal range of motion. Neck supple. No spinous process tenderness and no muscular tenderness present. Normal range of motion present.  ROM causes pain to the left sternocleidomastoid.   Cardiovascular: Normal rate, regular rhythm and normal heart sounds. Exam reveals no gallop and no friction rub.  No  murmur heard. Tenderness to palpation of left chest.   Pulmonary/Chest: Effort normal and breath sounds normal. No accessory muscle usage or stridor. No respiratory distress. She has no decreased breath sounds. She has no wheezes. She has no rhonchi. She has no rales.  Musculoskeletal:  No tenderness on palpation of the spinous processes.  Tenderness to palpation of left trapezius muscle.  Full range of motion. Strength normal and equal bilaterally. Grip strength normal and equal. Sensation intact and equal bilaterally.  Radial pulses 2+ and equal bilaterally. Capillary refill less than 2 seconds.   Neurological: She is alert and oriented to person, place, and time.  Skin: Skin is warm and dry. She is not diaphoretic.     UC Treatments / Results  Labs (all labs ordered are listed, but only abnormal results are displayed) Labs Reviewed - No data to display  EKG None  Radiology No results found.  Procedures Procedures (including critical care time)  Medications Ordered in UC Medications - No data to display  Initial Impression / Assessment and Plan / UC Course  I have reviewed the triage vital signs and the nursing notes.  Pertinent labs & imaging results that were available during my care of the patient were reviewed by me and considered in my medical decision making (see chart for details).    Start NSAID as directed for pain and inflammation. Discussed with patient strain can take up to 3-4 weeks to resolve, but should be getting better each week. Return precautions given.   Patient hypertensive at 189/100, currently asymptomatic, specifically denies current chest pain, shortness of  breath, headache/blurry vision, weakness, dizziness.  She has medication at home, but has not been taking them. Confirmed patient has prescription of amlodipine, lisinopril, HCTZ at home, she will take when she gets home.   Final Clinical Impressions(s) / UC Diagnoses   Final diagnoses:  Muscle  strain    ED Prescriptions    Medication Sig Dispense Auth. Provider   meloxicam (MOBIC) 7.5 MG tablet Take 1 tablet (7.5 mg total) by mouth daily. 15 tablet Taneia Mealor V, PA-C   diclofenac sodium (VOLTAREN) 1 % GEL Apply 2 g topically 4 (four) times daily. 1 Tube Tobin Chad, [place] 07/03/18 [phone]

## 2018-07-03 NOTE — Discharge Instructions (Addendum)
Start Mobic. Do not take ibuprofen (motrin/advil)/ naproxen (aleve) while on mobic. You can use voltaren gel on affected area to help with further pain relief needed. Heat compresses as needed. This can take up to 3-4 weeks to completely resolve, but you should be feeling better each week. Follow up here or with PCP if symptoms worsen, changes for reevaluation. If experiencing worsening symptoms, numbness/tingling of the fingers, unable to grip things, follow up for reevaluation.  Please take your blood pressure medicine immediately when you go home, including amlodipine, lisinopril and HCTZ. You blood pressure was 189/100. If blood pressure continues to be high, experiencing chest pain, shortness of breath, palpitations, nausea/vomiting, weakness, dizziness, passing out, please go to the emergency department for further evaluation needed.

## 2018-07-03 NOTE — ED Triage Notes (Signed)
Pt has been suffering from left neck pain that travels down her shoulder and into her left upper chest.  She can move her head to the right and up and down, but she cannot move her head to the left.  She has been taking Advil with no relief.

## 2018-08-03 ENCOUNTER — Emergency Department (HOSPITAL_COMMUNITY)
Admission: EM | Admit: 2018-08-03 | Discharge: 2018-08-03 | Disposition: A | Discharge status: Home / Self Care | Payer: Medicare Other | Attending: Emergency Medicine | Admitting: Emergency Medicine

## 2018-08-03 ENCOUNTER — Other Ambulatory Visit: Payer: Self-pay

## 2018-08-03 DIAGNOSIS — Z9114 Patient's other noncompliance with medication regimen: Secondary | ICD-10-CM | POA: Diagnosis not present

## 2018-08-03 DIAGNOSIS — I10 Essential (primary) hypertension: Secondary | ICD-10-CM | POA: Diagnosis not present

## 2018-08-03 DIAGNOSIS — Z79899 Other long term (current) drug therapy: Secondary | ICD-10-CM | POA: Diagnosis not present

## 2018-08-03 DIAGNOSIS — Z7984 Long term (current) use of oral hypoglycemic drugs: Secondary | ICD-10-CM | POA: Insufficient documentation

## 2018-08-03 DIAGNOSIS — K047 Periapical abscess without sinus: Secondary | ICD-10-CM | POA: Diagnosis not present

## 2018-08-03 DIAGNOSIS — K0889 Other specified disorders of teeth and supporting structures: Secondary | ICD-10-CM | POA: Diagnosis present

## 2018-08-03 DIAGNOSIS — E119 Type 2 diabetes mellitus without complications: Secondary | ICD-10-CM | POA: Insufficient documentation

## 2018-08-03 DIAGNOSIS — F17210 Nicotine dependence, cigarettes, uncomplicated: Secondary | ICD-10-CM | POA: Insufficient documentation

## 2018-08-03 MED ORDER — CLINDAMYCIN HCL 150 MG PO CAPS: 450.0000 mg | ORAL_CAPSULE | Freq: Three times a day (TID) | ORAL | 0 refills | Status: DC

## 2018-08-03 MED ORDER — HYDROCODONE-ACETAMINOPHEN 5-325 MG PO TABS: 1.0000 | ORAL_TABLET | ORAL | 0 refills | Status: AC | PRN

## 2018-08-03 NOTE — Discharge Instructions (Addendum)
I have provided pain medication please take for severe dental pain. You may splint the tablet in half. I have prescribed antibiotics please take as directed. I have also provided a referal to the [facility], please follow up for dental care.  If you experience any fever, pain with eye movement, rhinorrhea please return to the ED for further eval.

## 2018-08-03 NOTE — ED Provider Notes (Signed)
[facility] EMERGENCY DEPARTMENT Provider Note   CSN: [id number] Arrival date & time: 08/03/18  1831     History   Chief Complaint Chief Complaint  Patient presents with  . Dental Pain    HPI Sarah Long is a 47 y.o. female.  47 y/o female with a PMH of DM, HTN to the ED with a chief complaint of right dental abscess x1 week.She reports the pain as throbbing along the right upper side of her gum.She has tried tylenol, ibuprofen, peroxide rinse and warm water but had no relieve in symptoms. She also reports right maxillary pressure. Patient was told to see a dentist about a year ago but states she cannot due to financial burden. She denies any rhinorrhea,fever, pain with eye movement or involvement of her neck.      Past Medical History:  Diagnosis Date  . Abnormal Pap smear 1998  . Abnormal vaginal bleeding 12/19/2011  . Bacterial infection   . Bipolar 1 disorder ([place])   . Candida vaginitis 07/2007  . Depression    recently added wellbutrin-has not taken yet for bipolar  . Diabetes in pregnancy   . Diabetes mellitus    nph 20U qam and qpm, regular with meals  . Fibroid   . Galactorrhea of right breast 2008  . H/O amenorrhea 07/2007  . H/O dizziness 10/14/11  . H/O dysmenorrhea 2010  . H/O menorrhagia 10/14/11  . H/O varicella   . Headache(784.0)   . Heavy vaginal bleeding due to contraceptive injection use 10/12/2011   Depo Provera  . Herpes   . HSV-2 infection 01/03/2009  . Hx: UTI (urinary tract infection) 2009  . Hypertension    on aldomet  . Increased BMI 2010  . Obesity 10/14/11  . Oligomenorrhea 07/2007  . Pelvic pain in female   . Preterm labor   . Trichomonas   . Yeast infection     Patient Active Problem List   Diagnosis Date Noted  . Blister of second toe of left foot 11/21/2016  . Diabetes type 2, uncontrolled ([place]) 10/21/2016  . Vaginal yeast infection 10/21/2016  . Swelling of left ring finger 10/21/2016  . Smoking 11/26/2013    . Irregular uterine bleeding 04/04/2012  . HYPERLIPIDEMIA 02/02/2007  . BIPOLAR DISORDER 02/02/2007  . HYPERTENSION, BENIGN SYSTEMIC 02/02/2007    Past Surgical History:  Procedure Laterality Date  . CESAREAN SECTION  1991     OB History    Gravida  6   Para  4   Term  1   Preterm  3   AB  2   Living  4     SAB  1   TAB  1   Ectopic  0   Multiple  0   Live Births  3            Home Medications    Prior to Admission medications   Medication Sig Start Date End Date Taking? Authorizing Provider  amLODipine (NORVASC) 10 MG tablet Take 1 tablet (10 mg total) by mouth daily. Patient not taking: Reported on 09/28/2017 03/07/17   Clent Demark, PA-C  aspirin 81 MG tablet Take 1 tablet (81 mg total) by mouth daily. Patient not taking: Reported on 09/28/2017 03/07/17   Clent Demark, PA-C  atorvastatin (LIPITOR) 40 MG tablet Take 1 tablet (40 mg total) by mouth daily. Patient not taking: Reported on 09/28/2017 03/07/17   Clent Demark, PA-C  Blood Glucose Monitoring Suppl (TRUE  METRIX METER) w/Device KIT 1 each by Does not apply route as needed. Patient not taking: Reported on 09/28/2017 03/07/17   Clent Demark, PA-C  cetirizine (ZYRTEC) 10 MG tablet Take 10 mg by mouth daily.    [provider]  clindamycin (CLEOCIN) 150 MG capsule Take 3 capsules (450 mg total) by mouth 3 (three) times daily for 7 days. 08/03/18 08/10/18  Janeece Fitting, PA-C  clonazePAM (KLONOPIN) 1 MG tablet Take 1 tablet (1 mg total) at bedtime by mouth. Patient not taking: Reported on 04/08/2018 10/12/17   Clent Demark, PA-C  diclofenac sodium (VOLTAREN) 1 % GEL Apply 2 g topically 4 (four) times daily. 07/03/18   Tasia Catchings, Amy V, PA-C  fluticasone (FLONASE) 50 MCG/ACT nasal spray Place 2 sprays into both nostrils daily. 12/25/17   Yu, Amy V, PA-C  glucose blood (TRUE METRIX BLOOD GLUCOSE TEST) test strip 1 each by Other route every morning. Patient not taking: Reported on  09/28/2017 03/07/17   Clent Demark, PA-C  hydrochlorothiazide (HYDRODIURIL) 12.5 MG tablet Take 1 tablet (12.5 mg total) by mouth daily. Patient not taking: Reported on 09/28/2017 03/07/17   Clent Demark, PA-C  HYDROcodone-acetaminophen (NORCO/VICODIN) 5-325 MG tablet Take 1 tablet by mouth every 4 (four) hours as needed for up to 3 days. 08/03/18 08/06/18  Janeece Fitting, PA-C  insulin glargine (LANTUS) 100 UNIT/ML injection Inject 0.6 mLs (60 Units total) into the skin at bedtime. Patient not taking: Reported on 04/08/2018 03/07/17   Clent Demark, PA-C  Insulin Pen Needle (B-D ULTRAFINE III SHORT PEN) 31G X 8 MM MISC 1 application by Does not apply route daily. Patient not taking: Reported on 09/28/2017 03/07/17   Clent Demark, PA-C  Insulin Syringe-Needle U-100 (TRUEPLUS INSULIN SYRINGE) 31G X 5/16" 1 ML MISC Use as directed Patient not taking: Reported on 09/28/2017 03/08/17   Clent Demark, PA-C  ipratropium (ATROVENT) 0.06 % nasal spray Place 2 sprays into both nostrils 4 (four) times daily. Patient not taking: Reported on 04/08/2018 12/25/17   Ok Edwards, PA-C  Lancet Devices (ADJUSTABLE LANCING DEVICE) MISC  05/08/18   [provider]  lisinopril (PRINIVIL,ZESTRIL) 40 MG tablet Take 1 tablet (40 mg total) by mouth daily. Patient not taking: Reported on 04/08/2018 03/07/17   Clent Demark, PA-C  meloxicam (MOBIC) 7.5 MG tablet Take 1 tablet (7.5 mg total) by mouth daily. 07/03/18   Tasia Catchings, Amy V, PA-C  metFORMIN (GLUCOPHAGE) 1000 MG tablet Take 1 tablet (1,000 mg total) by mouth 2 (two) times daily with a meal. 04/08/18   Tawny Asal, MD  naproxen sodium (ALEVE) 220 MG tablet Take 220-440 mg by mouth as needed (pain).    [provider]  TRUEPLUS LANCETS 28G MISC 1 each by Does not apply route every morning. Patient not taking: Reported on 09/28/2017 10/21/16   Boykin Nearing, MD  valproic acid (DEPAKENE) 250 MG capsule Take 1 capsule (250 mg total) 4 (four) times  daily by mouth. Patient not taking: Reported on 04/08/2018 10/12/17   Clent Demark, PA-C    Family History Family History  Problem Relation Age of Onset  . Hypertension Mother   . Diabetes Mother   . Heart disease Mother   . Hypertension Father   . Diabetes Father   . Heart disease Father   . Stroke Maternal Grandfather   . Other Neg Hx     Social History Social History   Tobacco Use  . Smoking status: Current Every  Day Smoker    Packs/day: 0.50    Years: 20.00    Pack years: 10.00    Types: Cigarettes  . Smokeless tobacco: Never Used  Substance Use Topics  . Alcohol use: No  . Drug use: No     Allergies   Strawberry extract and Sulfa antibiotics   Review of Systems Review of Systems  Constitutional: Negative for fever.  HENT: Positive for dental problem, sinus pressure and sinus pain. Negative for rhinorrhea.   All other systems reviewed and are negative.    Physical Exam Updated Vital Signs BP (!) 187/107 (BP Location: Right Arm)   Pulse 98   Temp 97.8 F (36.6 C) (Oral)   Ht 5' 4" (1.626 m)   Wt 90.7 kg   SpO2 99%   BMI 34.33 kg/m   Physical Exam  Constitutional: She is oriented to person, place, and time. She appears well-developed and well-nourished.  HENT:  Head: Normocephalic.  Nose: Right sinus exhibits maxillary sinus tenderness.    Mouth/Throat: Uvula is midline, oropharynx is clear and moist and mucous membranes are normal. No trismus in the jaw. Abnormal dentition. Dental abscesses and dental caries present. No uvula swelling. No tonsillar exudate.  Tenderness to palpation of right maxillary region. No nasal discharge or rhinorrhea noted.   Periapical abscess extending from the front upper gum line into the back. Poor dentition of the mouth, multiple caries disease, patient is a smoker.  Neck: Normal range of motion. Neck supple.  Cardiovascular: Normal heart sounds.  Pulmonary/Chest: Effort normal and breath sounds normal. She has  no wheezes.  Abdominal: Soft.  Neurological: She is alert and oriented to person, place, and time.  Nursing note and vitals reviewed.    ED Treatments / Results  Labs (all labs ordered are listed, but only abnormal results are displayed) Labs Reviewed - No data to display  EKG None  Radiology No results found.  Procedures Procedures (including critical care time)  Medications Ordered in ED Medications - No data to display   Initial Impression / Assessment and Plan / ED Course  I have reviewed the triage vital signs and the nursing notes.  Pertinent labs & imaging results that were available during my care of the patient were reviewed by me and considered in my medical decision making (see chart for details).     She presents with right upper gumline abscess extending into the back.  She denies any fever.  Patient states she has tried to see a Pharmacist, community but is unable to afford care.  At this time I have advised patient that we could drain her abscess in the ED, patient prefers the antibiotic route and will return if her abscess worsens.  Patient denies any fever, pain with eye movement.  At this time I would send patient home on antibiotics and also provided a referral to the [employer] in order to afford some dental care.  She is hypertensive upon ED visit states she has not taken her blood pressure medication in the past 2 days is in a lot of pain..  Return precautions provided.  Final Clinical Impressions(s) / ED Diagnoses   Final diagnoses:  Dental abscess    ED Discharge Orders         Ordered    HYDROcodone-acetaminophen (NORCO/VICODIN) 5-325 MG tablet  Every 4 hours PRN     08/03/18 2007    clindamycin (CLEOCIN) 150 MG capsule  3 times daily     08/03/18 2008  Janeece Fitting, PA-C 08/03/18 2008    Daleen Bo, MD 08/04/18 [phone]

## 2018-08-03 NOTE — ED Triage Notes (Signed)
Pt has had a tooth ache on the right upper jaw and has been having swelling also for 4 to 5 days. Kept her awake for two day.

## 2018-08-03 NOTE — ED Notes (Signed)
Pt stated she has not had blood pressure medication in 2 days.

## 2018-08-04 ENCOUNTER — Encounter (HOSPITAL_COMMUNITY): Payer: Self-pay | Admitting: Emergency Medicine

## 2018-08-04 ENCOUNTER — Emergency Department (HOSPITAL_COMMUNITY)
Admission: EM | Admit: 2018-08-04 | Discharge: 2018-08-04 | Disposition: A | Discharge status: Home / Self Care | Payer: Medicare Other | Attending: Emergency Medicine | Admitting: Emergency Medicine

## 2018-08-04 DIAGNOSIS — E119 Type 2 diabetes mellitus without complications: Secondary | ICD-10-CM | POA: Insufficient documentation

## 2018-08-04 DIAGNOSIS — R6884 Jaw pain: Secondary | ICD-10-CM | POA: Diagnosis present

## 2018-08-04 DIAGNOSIS — K047 Periapical abscess without sinus: Secondary | ICD-10-CM | POA: Insufficient documentation

## 2018-08-04 DIAGNOSIS — K029 Dental caries, unspecified: Secondary | ICD-10-CM | POA: Insufficient documentation

## 2018-08-04 DIAGNOSIS — I10 Essential (primary) hypertension: Secondary | ICD-10-CM | POA: Insufficient documentation

## 2018-08-04 DIAGNOSIS — Z7984 Long term (current) use of oral hypoglycemic drugs: Secondary | ICD-10-CM | POA: Insufficient documentation

## 2018-08-04 DIAGNOSIS — F17210 Nicotine dependence, cigarettes, uncomplicated: Secondary | ICD-10-CM | POA: Diagnosis not present

## 2018-08-04 DIAGNOSIS — F319 Bipolar disorder, unspecified: Secondary | ICD-10-CM | POA: Diagnosis not present

## 2018-08-04 DIAGNOSIS — Z79899 Other long term (current) drug therapy: Secondary | ICD-10-CM | POA: Diagnosis not present

## 2018-08-04 MED ORDER — CLINDAMYCIN HCL 150 MG PO CAPS: 450.0000 mg | ORAL_CAPSULE | Freq: Three times a day (TID) | ORAL | 0 refills | Status: DC

## 2018-08-04 MED ORDER — CLINDAMYCIN HCL 150 MG PO CAPS
450.0000 mg | ORAL_CAPSULE | Freq: Once | ORAL | Status: AC
  Administered 2018-08-04: 450 mg via ORAL
  Filled 2018-08-04: qty 3

## 2018-08-04 MED ORDER — BUPIVACAINE-EPINEPHRINE (PF) 0.5% -1:200000 IJ SOLN
1.8000 mL | Freq: Once | INTRAMUSCULAR | Status: AC
Start: 2018-08-04 — End: 2018-08-04
  Administered 2018-08-04: 1.8 mL
  Filled 2018-08-04: qty 1.8

## 2018-08-04 MED ORDER — HYDROCODONE-ACETAMINOPHEN 5-325 MG PO TABS
2.0000 | ORAL_TABLET | Freq: Once | ORAL | Status: AC
  Administered 2018-08-04: 2 via ORAL
  Filled 2018-08-04: qty 2

## 2018-08-04 NOTE — Discharge Instructions (Addendum)
1. Medications: Clindamycin, usual home medications 2. Treatment: rest, drink plenty of fluids, take medications as prescribed 3. Follow Up: Please followup with dentistry within 1 week for discussion of your diagnoses and further evaluation after today's visit; if you do not have a primary care doctor use the resource guide provided to find one; Return to the ER for high fevers, difficulty breathing, difficulty swallowing or other concerning symptoms

## 2018-08-04 NOTE — ED Provider Notes (Signed)
[facility] DEPART[facility] Provider Note   CSN: [id number] Arrival date & time: 08/04/18  0301     History   Chief Complaint Chief Complaint  Patient presents with  . Dental Abscess    HPI Sarah Long is a 47 y.o. female with a hx of diabetes, hypertension presents to the Emergency Department complaining of gradual, persistent, progressively worsening pain and swelling to the right upper jaw onset 1 week ago.  She reports significant worsening in the last 24 hours.  She reports associated maxillary pressure, right ear pain and right-sided neck pain.  Patient reports that she has been unable to see a dentist in the last year due to financial reasons.  She has taken Tylenol, ibuprofen and use peroxide rinse and warm water without alleviation of her symptoms.  She denies fever, chills, neck stiffness.  Patient was evaluated here in this emergency department earlier this evening.  At that time she declined I&D of her abscess.  She reports that she was unable to make it to the pharmacy before they closed and was therefore unable to pick up her pain medicine or antibiotic.  Reports the pain became so intense that she felt the need for additional assistance.   The history is provided by the patient and medical records. No language interpreter was used.    Past Medical History:  Diagnosis Date  . Abnormal Pap smear 1998  . Abnormal vaginal bleeding 12/19/2011  . Bacterial infection   . Bipolar 1 disorder ([place])   . Candida vaginitis 07/2007  . Depression    recently added wellbutrin-has not taken yet for bipolar  . Diabetes in pregnancy   . Diabetes mellitus    nph 20U qam and qpm, regular with meals  . Fibroid   . Galactorrhea of right breast 2008  . H/O amenorrhea 07/2007  . H/O dizziness 10/14/11  . H/O dysmenorrhea 2010  . H/O menorrhagia 10/14/11  . H/O varicella   . Headache(784.0)   . Heavy vaginal bleeding due to contraceptive injection use 10/12/2011   Depo Provera  . Herpes   . HSV-2 infection 01/03/2009  . Hx: UTI (urinary tract infection) 2009  . Hypertension    on aldomet  . Increased BMI 2010  . Obesity 10/14/11  . Oligomenorrhea 07/2007  . Pelvic pain in female   . Preterm labor   . Trichomonas   . Yeast infection     Patient Active Problem List   Diagnosis Date Noted  . Blister of second toe of left foot 11/21/2016  . Diabetes type 2, uncontrolled ([place]) 10/21/2016  . Vaginal yeast infection 10/21/2016  . Swelling of left ring finger 10/21/2016  . Smoking 11/26/2013  . Irregular uterine bleeding 04/04/2012  . HYPERLIPIDEMIA 02/02/2007  . BIPOLAR DISORDER 02/02/2007  . HYPERTENSION, BENIGN SYSTEMIC 02/02/2007    Past Surgical History:  Procedure Laterality Date  . CESAREAN SECTION  1991     OB History    Gravida  6   Para  4   Term  1   Preterm  3   AB  2   Living  4     SAB  1   TAB  1   Ectopic  0   Multiple  0   Live Births  3            Home Medications    Prior to Admission medications   Medication Sig Start Date End Date Taking? Authorizing Provider  amLODipine (NORVASC)  10 MG tablet Take 1 tablet (10 mg total) by mouth daily. Patient not taking: Reported on 09/28/2017 03/07/17   Clent Demark, PA-C  aspirin 81 MG tablet Take 1 tablet (81 mg total) by mouth daily. Patient not taking: Reported on 09/28/2017 03/07/17   Clent Demark, PA-C  atorvastatin (LIPITOR) 40 MG tablet Take 1 tablet (40 mg total) by mouth daily. Patient not taking: Reported on 09/28/2017 03/07/17   Clent Demark, PA-C  Blood Glucose Monitoring Suppl (TRUE METRIX METER) w/Device KIT 1 each by Does not apply route as needed. Patient not taking: Reported on 09/28/2017 03/07/17   Clent Demark, PA-C  cetirizine (ZYRTEC) 10 MG tablet Take 10 mg by mouth daily.    [provider]  clindamycin (CLEOCIN) 150 MG capsule Take 3 capsules (450 mg total) by mouth 3 (three) times daily. 08/04/18    Markeda Narvaez, Jarrett Soho, PA-C  clonazePAM (KLONOPIN) 1 MG tablet Take 1 tablet (1 mg total) at bedtime by mouth. Patient not taking: Reported on 04/08/2018 10/12/17   Clent Demark, PA-C  diclofenac sodium (VOLTAREN) 1 % GEL Apply 2 g topically 4 (four) times daily. 07/03/18   Tasia Catchings, Amy V, PA-C  fluticasone (FLONASE) 50 MCG/ACT nasal spray Place 2 sprays into both nostrils daily. 12/25/17   Yu, Amy V, PA-C  glucose blood (TRUE METRIX BLOOD GLUCOSE TEST) test strip 1 each by Other route every morning. Patient not taking: Reported on 09/28/2017 03/07/17   Clent Demark, PA-C  hydrochlorothiazide (HYDRODIURIL) 12.5 MG tablet Take 1 tablet (12.5 mg total) by mouth daily. Patient not taking: Reported on 09/28/2017 03/07/17   Clent Demark, PA-C  HYDROcodone-acetaminophen (NORCO/VICODIN) 5-325 MG tablet Take 1 tablet by mouth every 4 (four) hours as needed for up to 3 days. 08/03/18 08/06/18  Janeece Fitting, PA-C  insulin glargine (LANTUS) 100 UNIT/ML injection Inject 0.6 mLs (60 Units total) into the skin at bedtime. Patient not taking: Reported on 04/08/2018 03/07/17   Clent Demark, PA-C  Insulin Pen Needle (B-D ULTRAFINE III SHORT PEN) 31G X 8 MM MISC 1 application by Does not apply route daily. Patient not taking: Reported on 09/28/2017 03/07/17   Clent Demark, PA-C  Insulin Syringe-Needle U-100 (TRUEPLUS INSULIN SYRINGE) 31G X 5/16" 1 ML MISC Use as directed Patient not taking: Reported on 09/28/2017 03/08/17   Clent Demark, PA-C  ipratropium (ATROVENT) 0.06 % nasal spray Place 2 sprays into both nostrils 4 (four) times daily. Patient not taking: Reported on 04/08/2018 12/25/17   Ok Edwards, PA-C  Lancet Devices (ADJUSTABLE LANCING DEVICE) MISC  05/08/18   [provider]  lisinopril (PRINIVIL,ZESTRIL) 40 MG tablet Take 1 tablet (40 mg total) by mouth daily. Patient not taking: Reported on 04/08/2018 03/07/17   Clent Demark, PA-C  meloxicam (MOBIC) 7.5 MG tablet Take 1 tablet (7.5 mg  total) by mouth daily. 07/03/18   Tasia Catchings, Amy V, PA-C  metFORMIN (GLUCOPHAGE) 1000 MG tablet Take 1 tablet (1,000 mg total) by mouth 2 (two) times daily with a meal. 04/08/18   Tawny Asal, MD  naproxen sodium (ALEVE) 220 MG tablet Take 220-440 mg by mouth as needed (pain).    [provider]  TRUEPLUS LANCETS 28G MISC 1 each by Does not apply route every morning. Patient not taking: Reported on 09/28/2017 10/21/16   Boykin Nearing, MD  valproic acid (DEPAKENE) 250 MG capsule Take 1 capsule (250 mg total) 4 (four) times daily by mouth. Patient not taking: Reported  on 04/08/2018 10/12/17   Clent Demark, PA-C    Family History Family History  Problem Relation Age of Onset  . Hypertension Mother   . Diabetes Mother   . Heart disease Mother   . Hypertension Father   . Diabetes Father   . Heart disease Father   . Stroke Maternal Grandfather   . Other Neg Hx     Social History Social History   Tobacco Use  . Smoking status: Current Every Day Smoker    Packs/day: 0.50    Years: 20.00    Pack years: 10.00    Types: Cigarettes  . Smokeless tobacco: Never Used  Substance Use Topics  . Alcohol use: No  . Drug use: No     Allergies   Strawberry extract and Sulfa antibiotics   Review of Systems Review of Systems  Constitutional: Negative for appetite change, chills and fever.  HENT: Positive for dental problem, ear pain and facial swelling. Negative for drooling, nosebleeds, postnasal drip, rhinorrhea and trouble swallowing.   Eyes: Negative for pain and redness.  Respiratory: Negative for cough and wheezing.   Cardiovascular: Negative for chest pain.  Gastrointestinal: Negative for abdominal pain, nausea and vomiting.  Musculoskeletal: Negative for neck pain and neck stiffness.  Skin: Negative for color change and rash.  Neurological: Positive for headaches. Negative for weakness and light-headedness.  All other systems reviewed and are negative.    Physical  Exam Updated Vital Signs BP 139/78   Pulse 96   Temp 98.3 F (36.8 C) (Oral)   Resp 18   LMP 08/01/2018   SpO2 100%   Physical Exam  Constitutional: She appears well-developed and well-nourished.  HENT:  Head: Normocephalic.  Right Ear: Tympanic membrane, external ear and ear canal normal.  Left Ear: Tympanic membrane, external ear and ear canal normal.  Nose: Nose normal. Right sinus exhibits no maxillary sinus tenderness and no frontal sinus tenderness. Left sinus exhibits no maxillary sinus tenderness and no frontal sinus tenderness.  Mouth/Throat: Uvula is midline, oropharynx is clear and moist and mucous membranes are normal. No oral lesions. Abnormal dentition. Dental caries present. No uvula swelling or lacerations. No oropharyngeal exudate, posterior oropharyngeal edema, posterior oropharyngeal erythema or tonsillar abscesses.  Right upper teeth with multiple caries.  Gross abscess noted to the gingiva.   No fluctuance or induration to the buccal mucosa or sublingual area   Eyes: Pupils are equal, round, and reactive to light. Conjunctivae are normal. Right eye exhibits no discharge. Left eye exhibits no discharge.  Neck: Normal range of motion. Neck supple.  No stridor Handling secretions without difficulty No nuchal rigidity No cervical lymphadenopathy  Cardiovascular: Normal rate, regular rhythm and normal heart sounds.  Pulmonary/Chest: Effort normal. No respiratory distress.  Equal chest rise  Abdominal: Soft. Bowel sounds are normal. She exhibits no distension. There is no tenderness.  Lymphadenopathy:       Head (right side): Submandibular and tonsillar adenopathy present. No submental, no preauricular, no posterior auricular and no occipital adenopathy present.       Head (left side): No submandibular, no tonsillar, no preauricular, no posterior auricular and no occipital adenopathy present.    She has no cervical adenopathy.  Neurological: She is alert.  Skin: Skin  is warm and dry.  Psychiatric: She has a normal mood and affect.  Nursing note and vitals reviewed.    ED Treatments / Results   Procedures .Marland KitchenIncision and Drainage Date/Time: 08/04/2018 7:26 AM Performed by: Abigail Butts, PA-C  Authorized by: Abigail Butts, PA-C   Consent:    Consent obtained:  Verbal   Consent given by:  Patient   Risks discussed:  Bleeding, incomplete drainage, pain and damage to other organs   Alternatives discussed:  No treatment Universal protocol:    Procedure explained and questions answered to patient or proxy's satisfaction: yes     Relevant documents present and verified: yes     Test results available and properly labeled: yes     Imaging studies available: yes     Required blood products, implants, devices, and special equipment available: yes     Site/side marked: yes     Immediately prior to procedure a time out was called: yes     Patient identity confirmed:  Verbally with patient Location:    Type:  Abscess   Location:  Mouth   Mouth location:  Alveolar process Anesthesia (see MAR for exact dosages):    Anesthesia method:  Local infiltration   Local anesthetic:  Bupivacaine 0.5% WITH epi Procedure type:    Complexity:  Complex Procedure details:    Incision types:  Single straight   Incision depth:  Subcutaneous   Scalpel blade:  11   Wound management:  Probed and deloculated, irrigated with saline and extensive cleaning   Drainage:  Purulent and bloody   Drainage amount:  Moderate   Wound treatment:  Wound left open   Packing materials:  None Post-procedure details:    Patient tolerance of procedure:  Tolerated well, no immediate complications   (including critical care time)  Medications Ordered in ED Medications  HYDROcodone-acetaminophen (NORCO/VICODIN) 5-325 MG per tablet 2 tablet (2 tablets Oral Given 08/04/18 0608)  clindamycin (CLEOCIN) capsule 450 mg (450 mg Oral Given 08/04/18 0608)  bupivacaine-epinephrine  (MARCAINE W/ EPI) 0.5% -1:200000 injection 1.8 mL (1.8 mLs Infiltration Given 08/04/18 0609)     Initial Impression / Assessment and Plan / ED Course  I have reviewed the triage vital signs and the nursing notes.  Pertinent labs & imaging results that were available during my care of the patient were reviewed by me and considered in my medical decision making (see chart for details).     Patient with toothache and gross abscess.  Pt now willing to have I&D.  I&D with purulent drainage and improvement of pain.  Exam unconcerning for Ludwig's angina or spread of infection.  Will treat with Clinda and anti-inflammatories medicine.  Urged patient to follow-up with dentist.     Final Clinical Impressions(s) / ED Diagnoses   Final diagnoses:  Dental abscess  Pain due to dental caries    ED Discharge Orders         Ordered    clindamycin (CLEOCIN) 150 MG capsule  3 times daily     08/04/18 0735           Amelio Brosky, Jarrett Soho, PA-C 08/04/18 2241    Ripley Fraise, MD 08/05/18 [phone]

## 2018-08-04 NOTE — ED Triage Notes (Signed)
Patient reports worsening right upper molar pain with swelling onset yesterday unrelieved by medications .

## 2018-09-21 ENCOUNTER — Encounter: Payer: Self-pay | Admitting: Physician Assistant

## 2018-09-21 LAB — HM DIABETES EYE EXAM

## 2018-10-17 ENCOUNTER — Ambulatory Visit (INDEPENDENT_AMBULATORY_CARE_PROVIDER_SITE_OTHER): Payer: Self-pay | Admitting: Physician Assistant

## 2018-10-17 ENCOUNTER — Encounter (INDEPENDENT_AMBULATORY_CARE_PROVIDER_SITE_OTHER): Payer: Self-pay | Admitting: Physician Assistant

## 2018-10-17 ENCOUNTER — Other Ambulatory Visit: Payer: Self-pay

## 2018-10-17 VITALS — BP 145/88 | HR 88 | Temp 97.8°F | Ht 64.0 in | Wt 195.2 lb

## 2018-10-17 DIAGNOSIS — Z91199 Patient's noncompliance with other medical treatment and regimen due to unspecified reason: Secondary | ICD-10-CM

## 2018-10-17 DIAGNOSIS — B373 Candidiasis of vulva and vagina: Secondary | ICD-10-CM

## 2018-10-17 DIAGNOSIS — Z794 Long term (current) use of insulin: Secondary | ICD-10-CM

## 2018-10-17 DIAGNOSIS — E1142 Type 2 diabetes mellitus with diabetic polyneuropathy: Secondary | ICD-10-CM

## 2018-10-17 DIAGNOSIS — Z23 Encounter for immunization: Secondary | ICD-10-CM

## 2018-10-17 DIAGNOSIS — Z9119 Patient's noncompliance with other medical treatment and regimen: Secondary | ICD-10-CM

## 2018-10-17 DIAGNOSIS — E7800 Pure hypercholesterolemia, unspecified: Secondary | ICD-10-CM

## 2018-10-17 DIAGNOSIS — I10 Essential (primary) hypertension: Secondary | ICD-10-CM

## 2018-10-17 DIAGNOSIS — B379 Candidiasis, unspecified: Secondary | ICD-10-CM

## 2018-10-17 LAB — POCT GLYCOSYLATED HEMOGLOBIN (HGB A1C): Hemoglobin A1C: 11 % — AB (ref 4.0–5.6)

## 2018-10-17 MED ORDER — ASPIRIN 81 MG PO TABS: 81.0000 mg | ORAL_TABLET | Freq: Every day | ORAL | 3 refills | Status: DC

## 2018-10-17 MED ORDER — FLUCONAZOLE 150 MG PO TABS: 150.0000 mg | ORAL_TABLET | Freq: Once | ORAL | 0 refills | Status: AC

## 2018-10-17 MED ORDER — LANCETS 30G MISC: 1.0000 | Freq: Three times a day (TID) | 3 refills | Status: DC

## 2018-10-17 MED ORDER — HYDROCHLOROTHIAZIDE 12.5 MG PO TABS: 12.5000 mg | ORAL_TABLET | Freq: Every day | ORAL | 1 refills | Status: DC

## 2018-10-17 MED ORDER — ADJUSTABLE LANCING DEVICE MISC: 1.0000 | Freq: Three times a day (TID) | 5 refills | Status: DC

## 2018-10-17 MED ORDER — METFORMIN HCL 1000 MG PO TABS: 1000.0000 mg | ORAL_TABLET | Freq: Two times a day (BID) | ORAL | 0 refills | Status: DC

## 2018-10-17 MED ORDER — CLONIDINE HCL 0.1 MG PO TABS
0.2000 mg | ORAL_TABLET | Freq: Once | ORAL | Status: AC
  Administered 2018-10-17: 0.2 mg via ORAL

## 2018-10-17 MED ORDER — AMLODIPINE BESYLATE 10 MG PO TABS: 10.0000 mg | ORAL_TABLET | Freq: Every day | ORAL | 3 refills | Status: DC

## 2018-10-17 MED ORDER — TRUE METRIX METER W/DEVICE KIT: 1.0000 | PACK | 0 refills | Status: DC | PRN

## 2018-10-17 MED ORDER — METFORMIN HCL 1000 MG PO TABS: 1000.0000 mg | ORAL_TABLET | Freq: Two times a day (BID) | ORAL | 1 refills | Status: DC

## 2018-10-17 MED ORDER — INSULIN GLARGINE 100 UNIT/ML ~~LOC~~ SOLN: 60.0000 [IU] | Freq: Every day | SUBCUTANEOUS | 5 refills | Status: DC

## 2018-10-17 MED ORDER — GLUCOSE BLOOD VI STRP: 1.0000 | ORAL_STRIP | 3 refills | Status: DC

## 2018-10-17 MED ORDER — ATORVASTATIN CALCIUM 40 MG PO TABS: 40.0000 mg | ORAL_TABLET | Freq: Every day | ORAL | 3 refills | Status: DC

## 2018-10-17 MED ORDER — INSULIN PEN NEEDLE 31G X 8 MM MISC: 1.0000 "application " | Freq: Every day | 3 refills | Status: DC

## 2018-10-17 MED FILL — ATORVASTATIN CALCIUM 40 MG: 40 | 30 days supply | Qty: 30 | Fill #0

## 2018-10-17 MED FILL — TRUE METRIX TEST STRIP: 30 days supply | Qty: 100 | Fill #0

## 2018-10-17 MED FILL — LANTUS SOLOSTAR 100 UNITS/M: 100 | 15 days supply | Qty: 9 | Fill #0

## 2018-10-17 MED FILL — TRUEPLUS PEN NDL 31GX3/16": 31G X 5 MM | 25 days supply | Qty: 100 | Fill #0

## 2018-10-17 MED FILL — AMLODIPINE BESYLATE 10 MG T: 10 | 30 days supply | Qty: 30 | Fill #0

## 2018-10-17 MED FILL — FLUCONAZOLE 150 MG TABS: 150 | 1 days supply | Qty: 1 | Fill #0

## 2018-10-17 MED FILL — HYDROCHLOROTHIAZIDE 12.5 MG: 12.5 | 90 days supply | Qty: 90 | Fill #0

## 2018-10-17 MED FILL — metFORMIN HCL 1000 MG TABS: 1000 | 30 days supply | Qty: 60 | Fill #0

## 2018-10-17 MED FILL — TRUEPLUS PEN NDL 31GX3/16: 31G X 5 MM | 25 days supply | Qty: 100 | Fill #0

## 2018-10-17 MED FILL — TRUEplus LANCETS 28G MISC: 30 days supply | Qty: 100 | Fill #0

## 2018-10-17 NOTE — Patient Instructions (Signed)
Diabetes Mellitus and Nutrition When you have diabetes (diabetes mellitus), it is very important to have healthy eating habits because your blood sugar (glucose) levels are greatly affected by what you eat and drink. Eating healthy foods in the appropriate amounts, at about the same times every day, can help you:  Control your blood glucose.  Lower your risk of heart disease.  Improve your blood pressure.  Reach or maintain a healthy weight.  Every person with diabetes is different, and each person has different needs for a meal plan. Your health care provider may recommend that you work with a diet and nutrition specialist (dietitian) to make a meal plan that is best for you. Your meal plan may vary depending on factors such as:  The calories you need.  The medicines you take.  Your weight.  Your blood glucose, blood pressure, and cholesterol levels.  Your activity level.  Other health conditions you have, such as heart or kidney disease.  How do carbohydrates affect me? Carbohydrates affect your blood glucose level more than any other type of food. Eating carbohydrates naturally increases the amount of glucose in your blood. Carbohydrate counting is a method for keeping track of how many carbohydrates you eat. Counting carbohydrates is important to keep your blood glucose at a healthy level, especially if you use insulin or take certain oral diabetes medicines. It is important to know how many carbohydrates you can safely have in each meal. This is different for every person. Your dietitian can help you calculate how many carbohydrates you should have at each meal and for snack. Foods that contain carbohydrates include:  Bread, cereal, rice, pasta, and crackers.  Potatoes and corn.  Peas, beans, and lentils.  Milk and yogurt.  Fruit and juice.  Desserts, such as cakes, cookies, ice cream, and candy.  How does alcohol affect me? Alcohol can cause a sudden decrease in blood  glucose (hypoglycemia), especially if you use insulin or take certain oral diabetes medicines. Hypoglycemia can be a life-threatening condition. Symptoms of hypoglycemia (sleepiness, dizziness, and confusion) are similar to symptoms of having too much alcohol. If your health care provider says that alcohol is safe for you, follow these guidelines:  Limit alcohol intake to no more than 1 drink per day for nonpregnant women and 2 drinks per day for men. One drink equals 12 oz of beer, 5 oz of wine, or 1 oz of hard liquor.  Do not drink on an empty stomach.  Keep yourself hydrated with water, diet soda, or unsweetened iced tea.  Keep in mind that regular soda, juice, and other mixers may contain a lot of sugar and must be counted as carbohydrates.  What are tips for following this plan? Reading food labels  Start by checking the serving size on the label. The amount of calories, carbohydrates, fats, and other nutrients listed on the label are based on one serving of the food. Many foods contain more than one serving per package.  Check the total grams (g) of carbohydrates in one serving. You can calculate the number of servings of carbohydrates in one serving by dividing the total carbohydrates by 15. For example, if a food has 30 g of total carbohydrates, it would be equal to 2 servings of carbohydrates.  Check the number of grams (g) of saturated and trans fats in one serving. Choose foods that have low or no amount of these fats.  Check the number of milligrams (mg) of sodium in one serving. Most people   should limit total sodium intake to less than 2,300 mg per day.  Always check the nutrition information of foods labeled as "low-fat" or "nonfat". These foods may be higher in added sugar or refined carbohydrates and should be avoided.  Talk to your dietitian to identify your daily goals for nutrients listed on the label. Shopping  Avoid buying canned, premade, or processed foods. These  foods tend to be high in fat, sodium, and added sugar.  Shop around the outside edge of the grocery store. This includes fresh fruits and vegetables, bulk grains, fresh meats, and fresh dairy. Cooking  Use low-heat cooking methods, such as baking, instead of high-heat cooking methods like deep frying.  Cook using healthy oils, such as olive, canola, or sunflower oil.  Avoid cooking with butter, cream, or high-fat meats. Meal planning  Eat meals and snacks regularly, preferably at the same times every day. Avoid going long periods of time without eating.  Eat foods high in fiber, such as fresh fruits, vegetables, beans, and whole grains. Talk to your dietitian about how many servings of carbohydrates you can eat at each meal.  Eat 4-6 ounces of lean protein each day, such as lean meat, chicken, fish, eggs, or tofu. 1 ounce is equal to 1 ounce of meat, chicken, or fish, 1 egg, or 1/4 cup of tofu.  Eat some foods each day that contain healthy fats, such as avocado, nuts, seeds, and fish. Lifestyle   Check your blood glucose regularly.  Exercise at least 30 minutes 5 or more days each week, or as told by your health care provider.  Take medicines as told by your health care provider.  Do not use any products that contain nicotine or tobacco, such as cigarettes and e-cigarettes. If you need help quitting, ask your health care provider.  Work with a counselor or diabetes educator to identify strategies to manage stress and any emotional and social challenges. What are some questions to ask my health care provider?  Do I need to meet with a diabetes educator?  Do I need to meet with a dietitian?  What number can I call if I have questions?  When are the best times to check my blood glucose? Where to find more information:  American Diabetes Association: diabetes.org/food-and-fitness/food  Academy of Nutrition and Dietetics:  www.eatright.org/resources/health/diseases-and-conditions/diabetes  National Institute of Diabetes and Digestive and Kidney Diseases (NIH): www.niddk.nih.gov/health-information/diabetes/overview/diet-eating-physical-activity Summary  A healthy meal plan will help you control your blood glucose and maintain a healthy lifestyle.  Working with a diet and nutrition specialist (dietitian) can help you make a meal plan that is best for you.  Keep in mind that carbohydrates and alcohol have immediate effects on your blood glucose levels. It is important to count carbohydrates and to use alcohol carefully. This information is not intended to replace advice given to you by your health care provider. Make sure you discuss any questions you have with your health care provider. Document Released: 08/19/2005 Document Revised: 12/27/2016 Document Reviewed: 12/27/2016 Elsevier Interactive Patient Education  2018 [employer]  

## 2018-10-17 NOTE — Progress Notes (Signed)
Subjective:  Patient ID: Sarah Long, female    DOB: 1971/06/18  Age: 47 y.o. MRN: [id number]  CC: vaginal yeast infection  HPI  Sarah Long a 47 y.o.femalewith a PMH of DM2, HTN, HSV2, Bipolar2, and depression presents with complaint of vaginal yeast infection. Endorses visual blurring, polydipsia, polyuria. A1c 11.0% today. Patient was last seen here more than a year ago. She was asked to return in 4 weeks but failed to do return. She was found to be non compliant one year ago. Unfortunately patient continues to be noncompliant but cites lack of insurance coverage. Has applied to Medicaid but seems to have only obtained Family [employer] which will not cover her medications. Has not yet applied for [employer]. Complains of vaginal yeast infection and requests treatment.    BP noted to be highly elevated today. Not taking antihypertensives due to lack of funds and insurance coverage. Does not endorse CP, palpitations, SOB, HA, abdominal pain, f/c/n/v, rash, swelling,or GI sxs.   Outpatient Medications Prior to Visit  Medication Sig Dispense Refill  . amLODipine (NORVASC) 10 MG tablet Take 1 tablet (10 mg total) by mouth daily. (Patient not taking: Reported on 09/28/2017) 90 tablet 3  . aspirin 81 MG tablet Take 1 tablet (81 mg total) by mouth daily. (Patient not taking: Reported on 09/28/2017) 90 tablet 3  . atorvastatin (LIPITOR) 40 MG tablet Take 1 tablet (40 mg total) by mouth daily. (Patient not taking: Reported on 09/28/2017) 90 tablet 3  . Blood Glucose Monitoring Suppl (TRUE METRIX METER) w/Device KIT 1 each by Does not apply route as needed. (Patient not taking: Reported on 09/28/2017) 1 kit 0  . glucose blood (TRUE METRIX BLOOD GLUCOSE TEST) test strip 1 each by Other route every morning. (Patient not taking: Reported on 09/28/2017) 90 each 3  . hydrochlorothiazide (HYDRODIURIL) 12.5 MG tablet Take 1 tablet (12.5 mg total) by mouth daily. (Patient not taking:  Reported on 09/28/2017) 90 tablet 1  . insulin glargine (LANTUS) 100 UNIT/ML injection Inject 0.6 mLs (60 Units total) into the skin at bedtime. (Patient not taking: Reported on 04/08/2018) 18 mL 5  . Insulin Pen Needle (B-D ULTRAFINE III SHORT PEN) 31G X 8 MM MISC 1 application by Does not apply route daily. (Patient not taking: Reported on 09/28/2017) 90 each 3  . Insulin Syringe-Needle U-100 (TRUEPLUS INSULIN SYRINGE) 31G X 5/16" 1 ML MISC Use as directed (Patient not taking: Reported on 09/28/2017) 100 each 12  . Lancet Devices (ADJUSTABLE LANCING DEVICE) MISC     . metFORMIN (GLUCOPHAGE) 1000 MG tablet Take 1 tablet (1,000 mg total) by mouth 2 (two) times daily with a meal. (Patient not taking: Reported on 10/17/2018) 180 tablet 0  . naproxen sodium (ALEVE) 220 MG tablet Take 220-440 mg by mouth as needed (pain).    . TRUEPLUS LANCETS 28G MISC 1 each by Does not apply route every morning. (Patient not taking: Reported on 09/28/2017) 90 each 3  . cetirizine (ZYRTEC) 10 MG tablet Take 10 mg by mouth daily.    . clindamycin (CLEOCIN) 150 MG capsule Take 3 capsules (450 mg total) by mouth 3 (three) times daily. 90 capsule 0  . clonazePAM (KLONOPIN) 1 MG tablet Take 1 tablet (1 mg total) at bedtime by mouth. (Patient not taking: Reported on 04/08/2018) 21 tablet 0  . diclofenac sodium (VOLTAREN) 1 % GEL Apply 2 g topically 4 (four) times daily. 1 Tube 0  . fluticasone (FLONASE) 50 MCG/ACT nasal  spray Place 2 sprays into both nostrils daily. 1 g 0  . ipratropium (ATROVENT) 0.06 % nasal spray Place 2 sprays into both nostrils 4 (four) times daily. (Patient not taking: Reported on 04/08/2018) 15 mL 0  . lisinopril (PRINIVIL,ZESTRIL) 40 MG tablet Take 1 tablet (40 mg total) by mouth daily. (Patient not taking: Reported on 04/08/2018) 90 tablet 1  . meloxicam (MOBIC) 7.5 MG tablet Take 1 tablet (7.5 mg total) by mouth daily. 15 tablet 0  . valproic acid (DEPAKENE) 250 MG capsule Take 1 capsule (250 mg total) 4  (four) times daily by mouth. (Patient not taking: Reported on 04/08/2018) 60 capsule 0   No facility-administered medications prior to visit.      ROS Review of Systems  Constitutional: Negative for chills, fever and malaise/fatigue.  Eyes: Negative for blurred vision.  Respiratory: Negative for shortness of breath.   Cardiovascular: Negative for chest pain and palpitations.  Gastrointestinal: Negative for abdominal pain and nausea.  Genitourinary: Negative for dysuria and hematuria.  Musculoskeletal: Negative for joint pain and myalgias.  Skin: Negative for rash.  Neurological: Negative for tingling and headaches.  Psychiatric/Behavioral: Negative for depression. The patient is not nervous/anxious.     Objective:  BP (!) 158/100 (BP Location: Right Arm, Patient Position: Sitting, Cuff Size: Normal)   Pulse 97   Temp 97.8 F (36.6 C) (Oral)   Ht '5\' 4"'$  (1.626 m)   Wt 195 lb 3.2 oz (88.5 kg)   LMP 10/06/2018 (Approximate)   SpO2 100%   BMI 33.51 kg/m   Vitals:   10/17/18 1525  BP: (!) 158/100  Pulse: 97  Temp: 97.8 F (36.6 C)  TempSrc: Oral  SpO2: 100%  Weight: 195 lb 3.2 oz (88.5 kg)  Height: '5\' 4"'$  (1.626 m)     Physical Exam  Constitutional: She is oriented to person, place, and time.  Well developed, well nourished, NAD, polite  HENT:  Head: Normocephalic and atraumatic.  Eyes: No scleral icterus.  Neck: Normal range of motion. Neck supple. No thyromegaly present.  Cardiovascular: Normal rate, regular rhythm and normal heart sounds.  Pulmonary/Chest: Effort normal and breath sounds normal.  Abdominal: Soft. Bowel sounds are normal. There is no tenderness.  Musculoskeletal: She exhibits no edema.  Neurological: She is alert and oriented to person, place, and time.  Skin: Skin is warm and dry. No rash noted. No erythema. No pallor.  Psychiatric: She has a normal mood and affect. Her behavior is normal. Thought content normal.  Vitals  reviewed.    Assessment & Plan:    1. Type 2 diabetes mellitus with diabetic polyneuropathy, with long-term current use of insulin (HCC) - HgB A1c 11.0% today. - insulin glargine (LANTUS) 100 UNIT/ML injection; Inject 0.6 mLs (60 Units total) into the skin at bedtime.  Dispense: 18 mL; Refill: 5 - Insulin Pen Needle (B-D ULTRAFINE III SHORT PEN) 31G X 8 MM MISC; 1 application by Does not apply route daily.  Dispense: 90 each; Refill: 3 - Blood Glucose Monitoring Suppl (TRUE METRIX METER) w/Device KIT; 1 each by Does not apply route as needed.  Dispense: 1 kit; Refill: 0 - glucose blood (TRUE METRIX BLOOD GLUCOSE TEST) test strip; 1 each by Other route every morning.  Dispense: 90 each; Refill: 3 - metFORMIN (GLUCOPHAGE) 1000 MG tablet; Take 1 tablet (1,000 mg total) by mouth 2 (two) times daily with a meal.  Dispense: 180 tablet; Refill: 1  2. Hypertension, unspecified type - Administered cloNIDine (CATAPRES) tablet 0.2 mg -  Comprehensive metabolic panel - hydrochlorothiazide (HYDRODIURIL) 12.5 MG tablet; Take 1 tablet (12.5 mg total) by mouth daily.  Dispense: 90 tablet; Refill: 1 - aspirin 81 MG tablet; Take 1 tablet (81 mg total) by mouth daily.  Dispense: 90 tablet; Refill: 3 - amLODipine (NORVASC) 10 MG tablet; Take 1 tablet (10 mg total) by mouth daily.  Dispense: 90 tablet; Refill: 3 - Lipid panel; Future  3. Yeast infection - fluconazole (DIFLUCAN) 150 MG tablet; Take 1 tablet (150 mg total) by mouth once for 1 dose.  Dispense: 1 tablet; Refill: 0  4. Pure hypercholesterolemia - atorvastatin (LIPITOR) 40 MG tablet; Take 1 tablet (40 mg total) by mouth daily.  Dispense: 90 tablet; Refill: 3  5. Need for prophylactic vaccination and inoculation against influenza - Pneumococcal polysaccharide vaccine 23-valent greater than or equal to 2yo subcutaneous/IM  6. Need for pneumococcal vaccination - Flu Vaccine QUAD 6+ mos PF IM (Fluarix Quad PF)  7. Noncompliance   Meds ordered  this encounter  Medications  . cloNIDine (CATAPRES) tablet 0.2 mg  . DISCONTD: metFORMIN (GLUCOPHAGE) 1000 MG tablet    Sig: Take 1 tablet (1,000 mg total) by mouth 2 (two) times daily with a meal.    Dispense:  180 tablet    Refill:  0    Order Specific Question:   Supervising Provider    Answer:   Charlott Rakes [[id number]]  . hydrochlorothiazide (HYDRODIURIL) 12.5 MG tablet    Sig: Take 1 tablet (12.5 mg total) by mouth daily.    Dispense:  90 tablet    Refill:  1    Order Specific Question:   Supervising Provider    Answer:   Charlott Rakes [[id number]]  . insulin glargine (LANTUS) 100 UNIT/ML injection    Sig: Inject 0.6 mLs (60 Units total) into the skin at bedtime.    Dispense:  18 mL    Refill:  5    Dc TOUJEO    Order Specific Question:   Supervising Provider    Answer:   Charlott Rakes [[id number]]  . Insulin Pen Needle (B-D ULTRAFINE III SHORT PEN) 31G X 8 MM MISC    Sig: 1 application by Does not apply route daily.    Dispense:  90 each    Refill:  3    Order Specific Question:   Supervising Provider    Answer:   Charlott Rakes [[id number]]  . Lancet Devices (ADJUSTABLE LANCING DEVICE) MISC    Sig: Inject 1 each into the skin 3 (three) times daily.    Dispense:  100 each    Refill:  5    Order Specific Question:   Supervising Provider    Answer:   Charlott Rakes [[id number]]  . atorvastatin (LIPITOR) 40 MG tablet    Sig: Take 1 tablet (40 mg total) by mouth daily.    Dispense:  90 tablet    Refill:  3    Order Specific Question:   Supervising Provider    Answer:   Charlott Rakes [[id number]]  . aspirin 81 MG tablet    Sig: Take 1 tablet (81 mg total) by mouth daily.    Dispense:  90 tablet    Refill:  3    Order Specific Question:   Supervising Provider    Answer:   Charlott Rakes [[id number]]  . amLODipine (NORVASC) 10 MG tablet    Sig: Take 1 tablet (10 mg total) by mouth daily.    Dispense:  90 tablet    Refill:  3  Order Specific Question:   Supervising Provider    Answer:    Charlott Rakes [[id number]]  . Blood Glucose Monitoring Suppl (TRUE METRIX METER) w/Device KIT    Sig: 1 each by Does not apply route as needed.    Dispense:  1 kit    Refill:  0    Order Specific Question:   Supervising Provider    Answer:   Charlott Rakes [[id number]]  . Lancets 30G MISC    Sig: 1 each by Does not apply route 3 (three) times daily.    Dispense:  100 each    Refill:  3    Order Specific Question:   Supervising Provider    Answer:   Charlott Rakes [[id number]]  . glucose blood (TRUE METRIX BLOOD GLUCOSE TEST) test strip    Sig: 1 each by Other route every morning.    Dispense:  90 each    Refill:  3    Order Specific Question:   Supervising Provider    Answer:   Charlott Rakes [[id number]]  . metFORMIN (GLUCOPHAGE) 1000 MG tablet    Sig: Take 1 tablet (1,000 mg total) by mouth 2 (two) times daily with a meal.    Dispense:  180 tablet    Refill:  1    Order Specific Question:   Supervising Provider    Answer:   Charlott Rakes [[id number]]  . fluconazole (DIFLUCAN) 150 MG tablet    Sig: Take 1 tablet (150 mg total) by mouth once for 1 dose.    Dispense:  1 tablet    Refill:  0    Order Specific Question:   Supervising Provider    Answer:   Charlott Rakes [[id number]]    Follow-up: Return in about 4 weeks (around 11/14/2018) for HTN.   Clent Demark PA

## 2018-10-18 ENCOUNTER — Telehealth (INDEPENDENT_AMBULATORY_CARE_PROVIDER_SITE_OTHER): Payer: Self-pay

## 2018-10-18 LAB — COMPREHENSIVE METABOLIC PANEL
ALT: 32 IU/L (ref 0–32)
AST: 28 IU/L (ref 0–40)
Albumin/Globulin Ratio: 1.1 — ABNORMAL LOW (ref 1.2–2.2)
Albumin: 3.7 g/dL (ref 3.5–5.5)
Alkaline Phosphatase: 90 IU/L (ref 39–117)
BUN/Creatinine Ratio: 15 (ref 9–23)
BUN: 11 mg/dL (ref 6–24)
CALCIUM: 9.3 mg/dL (ref 8.7–10.2)
CO2: 21 mmol/L (ref 20–29)
Chloride: 102 mmol/L (ref 96–106)
Creatinine, Ser: 0.75 mg/dL (ref 0.57–1.00)
GFR, EST AFRICAN AMERICAN: 112 mL/min/{1.73_m2} (ref >59)
GFR, EST NON AFRICAN AMERICAN: 97 mL/min/{1.73_m2} (ref >59)
GLOBULIN, TOTAL: 3.3 g/dL (ref 1.5–4.5)
Glucose: 212 mg/dL — ABNORMAL HIGH (ref 65–99)
Potassium: 4.2 mmol/L (ref 3.5–5.2)
Sodium: 139 mmol/L (ref 134–144)
TOTAL PROTEIN: 7 g/dL (ref 6.0–8.5)

## 2018-10-18 NOTE — Telephone Encounter (Signed)
-----   Message from Clent Demark, PA-C sent at 10/18/2018  8:45 AM EST ----- Normal except for elevated glucose.

## 2018-10-18 NOTE — Telephone Encounter (Signed)
Patient is aware that labs are normal except elevated glucose. Advised patient to take medications as directed. Nat Christen, CMA

## 2018-11-11 ENCOUNTER — Emergency Department (HOSPITAL_COMMUNITY): Payer: Self-pay

## 2018-11-11 ENCOUNTER — Other Ambulatory Visit: Payer: Self-pay

## 2018-11-11 ENCOUNTER — Emergency Department (HOSPITAL_COMMUNITY)
Admission: EM | Admit: 2018-11-11 | Discharge: 2018-11-11 | Disposition: A | Discharge status: Home / Self Care | Payer: Self-pay | Attending: Emergency Medicine | Admitting: Emergency Medicine

## 2018-11-11 DIAGNOSIS — I10 Essential (primary) hypertension: Secondary | ICD-10-CM | POA: Insufficient documentation

## 2018-11-11 DIAGNOSIS — Z7982 Long term (current) use of aspirin: Secondary | ICD-10-CM | POA: Insufficient documentation

## 2018-11-11 DIAGNOSIS — Z794 Long term (current) use of insulin: Secondary | ICD-10-CM | POA: Insufficient documentation

## 2018-11-11 DIAGNOSIS — E119 Type 2 diabetes mellitus without complications: Secondary | ICD-10-CM | POA: Insufficient documentation

## 2018-11-11 DIAGNOSIS — F17210 Nicotine dependence, cigarettes, uncomplicated: Secondary | ICD-10-CM | POA: Insufficient documentation

## 2018-11-11 DIAGNOSIS — R102 Pelvic and perineal pain: Secondary | ICD-10-CM

## 2018-11-11 DIAGNOSIS — A599 Trichomoniasis, unspecified: Secondary | ICD-10-CM | POA: Insufficient documentation

## 2018-11-11 DIAGNOSIS — Z79899 Other long term (current) drug therapy: Secondary | ICD-10-CM | POA: Insufficient documentation

## 2018-11-11 LAB — URINALYSIS, ROUTINE W REFLEX MICROSCOPIC
Bilirubin Urine: NEGATIVE
Glucose, UA: 50 mg/dL — AB
Ketones, ur: NEGATIVE mg/dL
Nitrite: NEGATIVE
Protein, ur: 100 mg/dL — AB
Specific Gravity, Urine: 1.013 (ref 1.005–1.030)
pH: 6 (ref 5.0–8.0)

## 2018-11-11 LAB — WET PREP, GENITAL
Sperm: NONE SEEN
Yeast Wet Prep HPF POC: NONE SEEN

## 2018-11-11 LAB — CBC WITH DIFFERENTIAL/PLATELET
Abs Immature Granulocytes: 0.01 10*3/uL (ref 0.00–0.07)
Basophils Absolute: 0 10*3/uL (ref 0.0–0.1)
Basophils Relative: 0 %
Eosinophils Absolute: 0.2 10*3/uL (ref 0.0–0.5)
Eosinophils Relative: 3 %
HCT: 39 % (ref 36.0–46.0)
Hemoglobin: 12.1 g/dL (ref 12.0–15.0)
Immature Granulocytes: 0 %
Lymphocytes Relative: 32 %
Lymphs Abs: 2 10*3/uL (ref 0.7–4.0)
MCH: 28.7 pg (ref 26.0–34.0)
MCHC: 31 g/dL (ref 30.0–36.0)
MCV: 92.6 fL (ref 80.0–100.0)
Monocytes Absolute: 0.3 10*3/uL (ref 0.1–1.0)
Monocytes Relative: 5 %
Neutro Abs: 3.7 10*3/uL (ref 1.7–7.7)
Neutrophils Relative %: 60 %
Platelets: 253 10*3/uL (ref 150–400)
RBC: 4.21 MIL/uL (ref 3.87–5.11)
RDW: 13.4 % (ref 11.5–15.5)
WBC: 6.2 10*3/uL (ref 4.0–10.5)
nRBC: 0 % (ref 0.0–0.2)

## 2018-11-11 LAB — COMPREHENSIVE METABOLIC PANEL
ALT: 48 U/L — ABNORMAL HIGH (ref 0–44)
AST: 74 U/L — ABNORMAL HIGH (ref 15–41)
Albumin: 3.3 g/dL — ABNORMAL LOW (ref 3.5–5.0)
Alkaline Phosphatase: 70 U/L (ref 38–126)
Anion gap: 14 (ref 5–15)
BUN: 7 mg/dL (ref 6–20)
CO2: 20 mmol/L — ABNORMAL LOW (ref 22–32)
Calcium: 9 mg/dL (ref 8.9–10.3)
Chloride: 103 mmol/L (ref 98–111)
Creatinine, Ser: 0.65 mg/dL (ref 0.44–1.00)
GFR calc Af Amer: >60 mL/min (ref >60)
GFR calc non Af Amer: >60 mL/min (ref >60)
Glucose, Bld: 191 mg/dL — ABNORMAL HIGH (ref 70–99)
Potassium: 3.7 mmol/L (ref 3.5–5.1)
Sodium: 137 mmol/L (ref 135–145)
Total Bilirubin: 0.2 mg/dL — ABNORMAL LOW (ref 0.3–1.2)
Total Protein: 7.4 g/dL (ref 6.5–8.1)

## 2018-11-11 LAB — I-STAT BETA HCG BLOOD, ED (MC, WL, AP ONLY): I-stat hCG, quantitative: <5 m[IU]/mL (ref <5)

## 2018-11-11 MED ORDER — METRONIDAZOLE 500 MG PO TABS
2000.0000 mg | ORAL_TABLET | Freq: Once | ORAL | Status: AC
  Administered 2018-11-11: 2000 mg via ORAL
  Filled 2018-11-11: qty 4

## 2018-11-11 MED ORDER — MORPHINE SULFATE (PF) 4 MG/ML IV SOLN
4.0000 mg | Freq: Once | INTRAVENOUS | Status: AC
  Administered 2018-11-11: 4 mg via INTRAVENOUS
  Filled 2018-11-11: qty 1

## 2018-11-11 MED ORDER — IOHEXOL 300 MG/ML  SOLN
100.0000 mL | Freq: Once | INTRAMUSCULAR | Status: AC
  Administered 2018-11-11: 100 mL via INTRAVENOUS

## 2018-11-11 MED ORDER — FLUCONAZOLE 200 MG PO TABS: 200.0000 mg | ORAL_TABLET | Freq: Once | ORAL | 0 refills | Status: AC

## 2018-11-11 MED ORDER — CEFTRIAXONE SODIUM 250 MG IJ SOLR
250.0000 mg | Freq: Once | INTRAMUSCULAR | Status: AC
  Administered 2018-11-11: 250 mg via INTRAMUSCULAR
  Filled 2018-11-11: qty 250

## 2018-11-11 MED ORDER — ONDANSETRON HCL 4 MG/2ML IJ SOLN
4.0000 mg | Freq: Once | INTRAMUSCULAR | Status: AC
  Administered 2018-11-11: 4 mg via INTRAVENOUS
  Filled 2018-11-11: qty 2

## 2018-11-11 MED ORDER — LIDOCAINE HCL (PF) 1 % IJ SOLN
INTRAMUSCULAR | Status: AC
  Administered 2018-11-11: 5 mL
  Filled 2018-11-11: qty 5

## 2018-11-11 MED ORDER — SODIUM CHLORIDE 0.9 % IV BOLUS: 1000.0000 mL | Freq: Once | INTRAVENOUS | Status: DC

## 2018-11-11 MED ORDER — DOXYCYCLINE HYCLATE 100 MG PO CAPS: 100.0000 mg | ORAL_CAPSULE | Freq: Two times a day (BID) | ORAL | 0 refills | Status: AC

## 2018-11-11 NOTE — ED Triage Notes (Signed)
Pt having pain in the right lower abdominal area that has got worse over the past few days. Pt has had no N/V or fevers.

## 2018-11-11 NOTE — ED Provider Notes (Signed)
[place] EMERGENCY DEPARTMENT Provider Note   CSN: [id number] Arrival date & time: 11/11/18  0734     History   Chief Complaint Chief Complaint  Patient presents with  . Abdominal Pain    right lower    HPI Sarah Long is a 47 y.o. female with history of bipolar 1 disorder, diabetes mellitus, Candida vaginitis, dysmenorrhea, uterine fibroids, HLD, HTN HSV-2 infection presents for evaluation of acute onset, constant right lower quadrant abdominal pain for 3 days.  She reports pain is stabbing and burning, does not radiate.  She has nausea and vomiting at baseline secondary to complications from her diabetes as well as intermittent diarrhea, both of which she experienced yesterday.  Emesis is nonbloody and nonbilious and she denies melena or hematochezia.  She notes pressure when she urinates but denies urgency or frequency.  No fevers, chills, chest pain, or shortness of breath.  She does note abnormal vaginal discharge which she states is thick and yellow.  She went to her PCP recently who gave her 1 dose of Diflucan as she is prone to yeast infections but she states "1 dose does not usually work for me ".  Pain worsens with movement to the right.  She has tried ibuprofen and Tylenol with some relief. She is currently sexually active with one female partner but does not always use protection.   The history is provided by the patient.    Past Medical History:  Diagnosis Date  . Abnormal Pap smear 1998  . Abnormal vaginal bleeding 12/19/2011  . Bacterial infection   . Bipolar 1 disorder ([place])   . Candida vaginitis 07/2007  . Depression    recently added wellbutrin-has not taken yet for bipolar  . Diabetes in pregnancy   . Diabetes mellitus    nph 20U qam and qpm, regular with meals  . Fibroid   . Galactorrhea of right breast 2008  . H/O amenorrhea 07/2007  . H/O dizziness 10/14/11  . H/O dysmenorrhea 2010  . H/O menorrhagia 10/14/11  . H/O varicella   .  Headache(784.0)   . Heavy vaginal bleeding due to contraceptive injection use 10/12/2011   Depo Provera  . Herpes   . HSV-2 infection 01/03/2009  . Hx: UTI (urinary tract infection) 2009  . Hypertension    on aldomet  . Increased BMI 2010  . Obesity 10/14/11  . Oligomenorrhea 07/2007  . Pelvic pain in female   . Preterm labor   . Trichomonas   . Yeast infection     Patient Active Problem List   Diagnosis Date Noted  . Blister of second toe of left foot 11/21/2016  . Diabetes type 2, uncontrolled ([place]) 10/21/2016  . Vaginal yeast infection 10/21/2016  . Swelling of left ring finger 10/21/2016  . Smoking 11/26/2013  . Irregular uterine bleeding 04/04/2012  . HYPERLIPIDEMIA 02/02/2007  . BIPOLAR DISORDER 02/02/2007  . HYPERTENSION, BENIGN SYSTEMIC 02/02/2007    Past Surgical History:  Procedure Laterality Date  . CESAREAN SECTION  1991     OB History    Gravida  6   Para  4   Term  1   Preterm  3   AB  2   Living  4     SAB  1   TAB  1   Ectopic  0   Multiple  0   Live Births  3            Home Medications  Prior to Admission medications   Medication Sig Start Date End Date Taking? Authorizing Provider  amLODipine (NORVASC) 10 MG tablet Take 1 tablet (10 mg total) by mouth daily. 10/17/18   Clent Demark, PA-C  aspirin 81 MG tablet Take 1 tablet (81 mg total) by mouth daily. 10/17/18   Clent Demark, PA-C  atorvastatin (LIPITOR) 40 MG tablet Take 1 tablet (40 mg total) by mouth daily. 10/17/18   Clent Demark, PA-C  Blood Glucose Monitoring Suppl (TRUE METRIX METER) w/Device KIT 1 each by Does not apply route as needed. 10/17/18   Clent Demark, PA-C  doxycycline (VIBRAMYCIN) 100 MG capsule Take 1 capsule (100 mg total) by mouth 2 (two) times daily for 14 days. 11/11/18 11/25/18  Rodell Perna A, PA-C  fluconazole (DIFLUCAN) 200 MG tablet Take 1 tablet (200 mg total) by mouth once for 1 dose. 11/11/18 11/11/18  Rodell Perna A, PA-C    glucose blood (TRUE METRIX BLOOD GLUCOSE TEST) test strip 1 each by Other route every morning. 10/17/18   Clent Demark, PA-C  hydrochlorothiazide (HYDRODIURIL) 12.5 MG tablet Take 1 tablet (12.5 mg total) by mouth daily. 10/17/18   Clent Demark, PA-C  insulin glargine (LANTUS) 100 UNIT/ML injection Inject 0.6 mLs (60 Units total) into the skin at bedtime. 10/17/18   Clent Demark, PA-C  Insulin Pen Needle (B-D ULTRAFINE III SHORT PEN) 31G X 8 MM MISC 1 application by Does not apply route daily. 10/17/18   Clent Demark, PA-C  Lancet Devices (ADJUSTABLE LANCING DEVICE) MISC Inject 1 each into the skin 3 (three) times daily. 10/17/18   Clent Demark, PA-C  Lancets 30G MISC 1 each by Does not apply route 3 (three) times daily. 10/17/18   Clent Demark, PA-C  metFORMIN (GLUCOPHAGE) 1000 MG tablet Take 1 tablet (1,000 mg total) by mouth 2 (two) times daily with a meal. 10/17/18   Clent Demark, PA-C    Family History Family History  Problem Relation Age of Onset  . Hypertension Mother   . Diabetes Mother   . Heart disease Mother   . Hypertension Father   . Diabetes Father   . Heart disease Father   . Stroke Maternal Grandfather   . Other Neg Hx     Social History Social History   Tobacco Use  . Smoking status: Current Every Day Smoker    Packs/day: 0.50    Years: 20.00    Pack years: 10.00    Types: Cigarettes  . Smokeless tobacco: Never Used  Substance Use Topics  . Alcohol use: No  . Drug use: No     Allergies   Strawberry extract and Sulfa antibiotics   Review of Systems Review of Systems  Constitutional: Negative for chills and fever.  Respiratory: Negative for shortness of breath.   Cardiovascular: Negative for chest pain.  Gastrointestinal: Positive for abdominal pain, diarrhea, nausea and vomiting.  Genitourinary: Positive for dysuria, pelvic pain and vaginal discharge. Negative for frequency, hematuria and vaginal bleeding.   All other systems reviewed and are negative.    Physical Exam Updated Vital Signs BP 138/81   Pulse 83   Temp 97.8 F (36.6 C) (Oral)   Resp 16   Ht '5\' 4"'$  (1.626 m)   Wt 90.7 kg   LMP 11/02/2018 (Approximate)   SpO2 100%   BMI 34.33 kg/m   Physical Exam  Constitutional: She appears well-developed and well-nourished. No distress.  Tearful in appearance  HENT:  Head: Normocephalic and atraumatic.  Eyes: Conjunctivae are normal. Right eye exhibits no discharge. Left eye exhibits no discharge.  Neck: No JVD present. No tracheal deviation present.  Cardiovascular: Normal rate, regular rhythm and normal heart sounds.  Pulmonary/Chest: Effort normal and breath sounds normal.  Abdominal: Soft. Bowel sounds are normal. She exhibits no distension. There is tenderness in the right lower quadrant. There is no rigidity, no rebound, no guarding, no CVA tenderness, no tenderness at McBurney's point and negative Murphy's sign.  Severe tenderness along the right pelvic brim.   Genitourinary: Uterus is not tender. Cervix exhibits discharge. Cervix exhibits no motion tenderness and no friability. Right adnexum displays tenderness. Right adnexum displays no mass and no fullness. Left adnexum displays no mass, no tenderness and no fullness. Vaginal discharge found.  Genitourinary Comments: Examination performed in the presence of chaperone.  No masses or lesions to the external genitalia.  Moderate amount of yellow frothy discharge in the vaginal vault.  Musculoskeletal: She exhibits no edema.  Neurological: She is alert.  Skin: Skin is warm and dry. No erythema.  Psychiatric: She has a normal mood and affect. Her behavior is normal.  Nursing note and vitals reviewed.    ED Treatments / Results  Labs (all labs ordered are listed, but only abnormal results are displayed) Labs Reviewed  WET PREP, GENITAL - Abnormal; Notable for the following components:      Result Value   Trich, Wet Prep  PRESENT (*)    Clue Cells Wet Prep HPF POC PRESENT (*)    WBC, Wet Prep HPF POC MANY (*)    All other components within normal limits  COMPREHENSIVE METABOLIC PANEL - Abnormal; Notable for the following components:   CO2 20 (*)    Glucose, Bld 191 (*)    Albumin 3.3 (*)    AST 74 (*)    ALT 48 (*)    Total Bilirubin 0.2 (*)    All other components within normal limits  URINALYSIS, ROUTINE W REFLEX MICROSCOPIC - Abnormal; Notable for the following components:   APPearance HAZY (*)    Glucose, UA 50 (*)    Hgb urine dipstick SMALL (*)    Protein, ur 100 (*)    Leukocytes, UA MODERATE (*)    Bacteria, UA RARE (*)    All other components within normal limits  CBC WITH DIFFERENTIAL/PLATELET  RPR  HIV ANTIBODY (ROUTINE TESTING W REFLEX)  I-STAT BETA HCG BLOOD, ED ([facility], [facility], [facility] ONLY)  GC/CHLAMYDIA PROBE AMP ([place]) NOT AT [facility]    EKG None  Radiology Ct Abdomen Pelvis W Contrast  Result Date: 11/11/2018 CLINICAL DATA:  Right lower quadrant and groin pain for 4 days. Suspected appendicitis. EXAM: CT ABDOMEN AND PELVIS WITH CONTRAST TECHNIQUE: Multidetector CT imaging of the abdomen and pelvis was performed using the standard protocol following bolus administration of intravenous contrast. CONTRAST:  100 mL OMNIPAQUE IOHEXOL 300 MG/ML  SOLN COMPARISON:  03/03/2017 FINDINGS: Lower Chest: No acute findings. Hepatobiliary: No hepatic masses identified. Mild hepatic steatosis noted. Gallbladder is unremarkable. Pancreas:  No mass or inflammatory changes. Spleen: Within normal limits in size and appearance. Adrenals/Urinary Tract: No masses identified. No evidence of hydronephrosis. Stomach/Bowel: No evidence of obstruction, inflammatory process or abnormal fluid collections. Normal appendix visualized. Vascular/Lymphatic: No pathologically enlarged lymph nodes. No abdominal aortic aneurysm. Reproductive:  No mass or other significant abnormality. Other:  No evidence of inguinal hernia or mass.  Musculoskeletal:  No suspicious bone lesions identified. IMPRESSION: No evidence  of appendicitis or other acute findings. Hepatic steatosis Electronically Signed   By: Earle Gell M.D.   On: 11/11/2018 11:23    Procedures Procedures (including critical care time)  Medications Ordered in ED Medications  morphine 4 MG/ML injection 4 mg (4 mg Intravenous Given 11/11/18 0833)  ondansetron (ZOFRAN) injection 4 mg (4 mg Intravenous Given 11/11/18 0833)  iohexol (OMNIPAQUE) 300 MG/ML solution 100 mL (100 mLs Intravenous Contrast Given 11/11/18 1047)  metroNIDAZOLE (FLAGYL) tablet 2,000 mg (2,000 mg Oral Given 11/11/18 1157)  cefTRIAXone (ROCEPHIN) injection 250 mg (250 mg Intramuscular Given 11/11/18 1157)  lidocaine (PF) (XYLOCAINE) 1 % injection (5 mLs  Given 11/11/18 1157)     Initial Impression / Assessment and Plan / ED Course  I have reviewed the triage vital signs and the nursing notes.  Pertinent labs & imaging results that were available during my care of the patient were reviewed by me and considered in my medical decision making (see chart for details).     Patient presenting for evaluation of right lower pelvic pain, urinary symptoms, and vaginal discharge.  She is afebrile, vital signs are stable.  She is nontoxic in appearance.  No peritoneal signs on examination of the abdomen.  Lab work reviewed by me shows hyperglycemia with glucose of 438, no metabolic derangements.  No leukocytosis or anemia.  Her LFTs are mildly elevated which appears to be acute but there is evidence of hepatic steatosis on her CT scan.  No evidence of cholecystitis and cholelithiasis.  UA does not suggest significant UTI or show evidence of nephrolithiasis.  Wet prep with trichomonas, clue cells, and WBCs.  CT scan shows no acute surgical abdominal pathology including appendicitis, obstruction, perforation, cholecystitis, or dissection.  Doubt TOA, ovarian torsion, or ectopic pregnancy.  However, with trichomonas  and pelvic pain in a patient who has a history of prior STDs and HSV infection, I think it is reasonable to treat for PID.  We will also give her a prescription for Diflucan in the event that she develops yeast infection symptoms as she is prone to these.  On reevaluation the patient is resting comfortably no apparent distress.  Serial abdominal examinations remain benign.  She is tolerating p.o. food and fluids without difficulty.  Recommend follow with PCP or OB/GYN for reevaluation.  Encourage the patient to inform her partner(s) for further testing and treatment additionally.  Discussed strict ED return precautions. Pt verbalized understanding of and agreement with plan and is safe for discharge home at this time.  Final Clinical Impressions(s) / ED Diagnoses   Final diagnoses:  Trichomoniasis  Pelvic pain    ED Discharge Orders         Ordered    doxycycline (VIBRAMYCIN) 100 MG capsule  2 times daily     11/11/18 1146    fluconazole (DIFLUCAN) 200 MG tablet   Once     11/11/18 1146           Renita Papa, PA-C 11/11/18 1237    Lajean Saver, MD 11/11/18 1332

## 2018-11-11 NOTE — ED Notes (Signed)
Patient verbalizes understanding of discharge instructions. Opportunity for questioning and answers were provided. Armband removed by staff, pt discharged from ED ambulatory.   

## 2018-11-11 NOTE — Discharge Instructions (Signed)
Please take all of your antibiotics until finished!   You may develop abdominal discomfort or diarrhea from the antibiotic.  You may help offset this with probiotics which you can buy or get in yogurt. Do not eat  or take the probiotics until 2 hours after your antibiotic.   You can start taking Diflucan if you develop symptoms of a yeast infection but only if you develop symptoms of yeast infection.  Take your home medicines as prescribed.  Drink plenty of fluids and get plenty of rest.  Follow-up with your PCP or OB/GYN for reevaluation of symptoms.  I would recommend that your sexual partners follow-up with the health department for testing and treatment as well.

## 2018-11-12 LAB — RPR: RPR Ser Ql: NONREACTIVE

## 2018-11-12 LAB — HIV ANTIBODY (ROUTINE TESTING W REFLEX): HIV Screen 4th Generation wRfx: NONREACTIVE

## 2018-11-13 LAB — GC/CHLAMYDIA PROBE AMP (~~LOC~~) NOT AT ARMC
Chlamydia: NEGATIVE
Neisseria Gonorrhea: NEGATIVE

## 2018-11-13 MED FILL — DOXYCYCLINE HYCLATE 100 MG: 100 | 14 days supply | Qty: 28 | Fill #0

## 2018-11-14 ENCOUNTER — Encounter (INDEPENDENT_AMBULATORY_CARE_PROVIDER_SITE_OTHER): Payer: Self-pay | Admitting: Physician Assistant

## 2018-11-14 ENCOUNTER — Ambulatory Visit (INDEPENDENT_AMBULATORY_CARE_PROVIDER_SITE_OTHER): Payer: Self-pay | Admitting: Physician Assistant

## 2018-11-14 ENCOUNTER — Other Ambulatory Visit: Payer: Self-pay

## 2018-11-14 VITALS — BP 163/102 | HR 94 | Temp 98.1°F | Ht 64.0 in | Wt 191.8 lb

## 2018-11-14 DIAGNOSIS — R9431 Abnormal electrocardiogram [ECG] [EKG]: Secondary | ICD-10-CM

## 2018-11-14 DIAGNOSIS — Z794 Long term (current) use of insulin: Secondary | ICD-10-CM

## 2018-11-14 DIAGNOSIS — I10 Essential (primary) hypertension: Secondary | ICD-10-CM

## 2018-11-14 DIAGNOSIS — E1142 Type 2 diabetes mellitus with diabetic polyneuropathy: Secondary | ICD-10-CM

## 2018-11-14 DIAGNOSIS — Z79899 Other long term (current) drug therapy: Secondary | ICD-10-CM

## 2018-11-14 DIAGNOSIS — R011 Cardiac murmur, unspecified: Secondary | ICD-10-CM

## 2018-11-14 MED ORDER — FLUCONAZOLE 150 MG PO TABS: 150.0000 mg | ORAL_TABLET | ORAL | 0 refills | Status: DC

## 2018-11-14 MED ORDER — BLOOD GLUCOSE MONITOR KIT: PACK | 0 refills | Status: DC

## 2018-11-14 MED ORDER — HYDROCHLOROTHIAZIDE 25 MG PO TABS: 25.0000 mg | ORAL_TABLET | Freq: Every day | ORAL | 3 refills | Status: DC

## 2018-11-14 MED FILL — FLUCONAZOLE 150 MG TABS: 150 | 9 days supply | Qty: 3 | Fill #0

## 2018-11-14 MED FILL — HYDROCHLOROTHIAZIDE 25 MG T: 25 | 30 days supply | Qty: 30 | Fill #0

## 2018-11-14 NOTE — Progress Notes (Signed)
Subjective:  Patient ID: Sarah Long, female    DOB: 14-Oct-1971  Age: 47 y.o. MRN: [id number]  CC: f/u HTN  HPI Sarah Long is a 47 y.o. female with a medical history of DM2, HTN, HSV2,Bipolar 1 disorder, depression, galactorrhea of right breast, oligomenorrhea, fibroids, obesity, and trichomoniasis presents to f/u on HTN. Last seen here nearly one month ago. BP was 145/88 mmHg after clonidine administration. Had been noncompliant due to lack of insurance coverage. Prescribed HCTZ 12.5 mg and Amlodipine 10 mg and is now taking as directed. BP 148/92 mmHg today. Does not endorse CP, palpitations, SOB, HA, presyncope, syncope, swelling, abdominal pain, f/c/n/v, or GI/GU sxs.     Pt has not been checking blood sugars because her glucometer is "somewhere in storage". Taking anti-glycemics as directed.     Pt went to ED recently and diagnosed with trichomoniasis. Treated with Flagyl and Doxycycline. Pt concerned she will develop a yeast infection and asks for a prescription of fluconazole.        Outpatient Medications Prior to Visit  Medication Sig Dispense Refill  . aspirin 81 MG tablet Take 1 tablet (81 mg total) by mouth daily. 90 tablet 3  . atorvastatin (LIPITOR) 40 MG tablet Take 1 tablet (40 mg total) by mouth daily. 90 tablet 3  . Blood Glucose Monitoring Suppl (TRUE METRIX METER) w/Device KIT 1 each by Does not apply route as needed. 1 kit 0  . doxycycline (VIBRAMYCIN) 100 MG capsule Take 1 capsule (100 mg total) by mouth 2 (two) times daily for 14 days. 28 capsule 0  . glucose blood (TRUE METRIX BLOOD GLUCOSE TEST) test strip 1 each by Other route every morning. 90 each 3  . hydrochlorothiazide (HYDRODIURIL) 12.5 MG tablet Take 1 tablet (12.5 mg total) by mouth daily. 90 tablet 1  . Insulin Pen Needle (B-D ULTRAFINE III SHORT PEN) 31G X 8 MM MISC 1 application by Does not apply route daily. 90 each 3  . metFORMIN (GLUCOPHAGE) 1000 MG tablet Take 1 tablet (1,000 mg total) by mouth  2 (two) times daily with a meal. 180 tablet 1  . amLODipine (NORVASC) 10 MG tablet Take 1 tablet (10 mg total) by mouth daily. 90 tablet 3  . insulin glargine (LANTUS) 100 UNIT/ML injection Inject 0.6 mLs (60 Units total) into the skin at bedtime. (Patient not taking: Reported on 11/14/2018) 18 mL 5  . Lancet Devices (ADJUSTABLE LANCING DEVICE) MISC Inject 1 each into the skin 3 (three) times daily. (Patient not taking: Reported on 11/14/2018) 100 each 5  . Lancets 30G MISC 1 each by Does not apply route 3 (three) times daily. (Patient not taking: Reported on 11/14/2018) 100 each 3   No facility-administered medications prior to visit.      ROS Review of Systems  Constitutional: Negative for chills, fever and malaise/fatigue.  Eyes: Negative for blurred vision.  Respiratory: Negative for shortness of breath.   Cardiovascular: Negative for chest pain and palpitations.  Gastrointestinal: Negative for abdominal pain and nausea.  Genitourinary: Negative for dysuria and hematuria.  Musculoskeletal: Negative for joint pain and myalgias.  Skin: Negative for rash.  Neurological: Negative for tingling and headaches.  Psychiatric/Behavioral: Negative for depression. The patient is not nervous/anxious.     Objective:  BP (!) 148/92 (BP Location: Right Arm, Patient Position: Sitting, Cuff Size: Normal)   Pulse 94   Temp 98.1 F (36.7 C) (Oral)   Ht '5\' 4"'$  (1.626 m)   Wt 191 lb 12.8  oz (87 kg)   LMP 11/02/2018 (Approximate)   SpO2 96%   BMI 32.92 kg/m   BP/Weight 11/14/2018 11/11/2018 14/09/3012  Systolic BP 143 888 757  Diastolic BP 92 81 88  Wt. (Lbs) 191.8 200 195.2  BMI 32.92 34.33 33.51      Physical Exam  Constitutional: She is oriented to person, place, and time.  Well developed, overweight, NAD, polite  HENT:  Head: Normocephalic and atraumatic.  Eyes: No scleral icterus.  Neck: Normal range of motion. Neck supple. No thyromegaly present.  Cardiovascular: Normal rate and  regular rhythm.  2/6 holosystolic murmur.  Pulmonary/Chest: Effort normal and breath sounds normal.  Musculoskeletal: She exhibits no edema.  Neurological: She is alert and oriented to person, place, and time.  Skin: Skin is warm and dry. No rash noted. No erythema. No pallor.  Psychiatric: She has a normal mood and affect. Her behavior is normal. Thought content normal.  Vitals reviewed.    Assessment & Plan:   1. Hypertension, unspecified type - hydrochlorothiazide (HYDRODIURIL) 25 MG tablet; Take 1 tablet (25 mg total) by mouth daily.  Dispense: 90 tablet; Refill: 3 - Lipid panel; Future - EKG 12-Lead abnormal - Ambulatory referral to Cardiology  2. Type 2 diabetes mellitus with diabetic polyneuropathy, with long-term current use of insulin (HCC) - blood glucose meter kit and supplies KIT; Dispense based on patient and insurance preference. Use up to four times daily as directed. (FOR ICD-9 250.00, 250.01).  Dispense: 1 each; Refill: 0  3. Heart murmur - EKG 12-Lead - Ambulatory referral to Cardiology  4. EKG, abnormal - RBBB. Ambulatory referral to Cardiology. Pt asymptomatic.   5. High risk medication use - fluconazole (DIFLUCAN) 150 MG tablet; Take 1 tablet (150 mg total) by mouth every 3 (three) days.  Dispense: 3 tablet; Refill: 0   Meds ordered this encounter  Medications  . fluconazole (DIFLUCAN) 150 MG tablet    Sig: Take 1 tablet (150 mg total) by mouth every 3 (three) days.    Dispense:  3 tablet    Refill:  0    Order Specific Question:   Supervising Provider    Answer:   Charlott Rakes [[id number]]  . blood glucose meter kit and supplies KIT    Sig: Dispense based on patient and insurance preference. Use up to four times daily as directed. (FOR ICD-9 250.00, 250.01).    Dispense:  1 each    Refill:  0    Order Specific Question:   Number of strips    Answer:   100    Order Specific Question:   Number of lancets    Answer:   100    Order Specific Question:    Supervising Provider    Answer:   Charlott Rakes [[id number]]  . hydrochlorothiazide (HYDRODIURIL) 25 MG tablet    Sig: Take 1 tablet (25 mg total) by mouth daily.    Dispense:  90 tablet    Refill:  3    Order Specific Question:   Supervising Provider    Answer:   Charlott Rakes [[id number]]    Follow-up: Return in about 8 weeks (around 01/09/2019) for HTN and A1c.   Clent Demark PA

## 2018-11-14 NOTE — Patient Instructions (Signed)
DASH Eating Plan DASH stands for "Dietary Approaches to Stop Hypertension." The DASH eating plan is a healthy eating plan that has been shown to reduce high blood pressure (hypertension). It may also reduce your risk for type 2 diabetes, heart disease, and stroke. The DASH eating plan may also help with weight loss. What are tips for following this plan? General guidelines  Avoid eating more than 2,300 mg (milligrams) of salt (sodium) a day. If you have hypertension, you may need to reduce your sodium intake to 1,500 mg a day.  Limit alcohol intake to no more than 1 drink a day for nonpregnant women and 2 drinks a day for men. One drink equals 12 oz of beer, 5 oz of wine, or 1 oz of hard liquor.  Work with your health care provider to maintain a healthy body weight or to lose weight. Ask what an ideal weight is for you.  Get at least 30 minutes of exercise that causes your heart to beat faster (aerobic exercise) most days of the week. Activities may include walking, swimming, or biking.  Work with your health care provider or diet and nutrition specialist (dietitian) to adjust your eating plan to your individual calorie needs. Reading food labels  Check food labels for the amount of sodium per serving. Choose foods with less than 5 percent of the Daily Value of sodium. Generally, foods with less than 300 mg of sodium per serving fit into this eating plan.  To find whole grains, look for the word "whole" as the first word in the ingredient list. Shopping  Buy products labeled as "low-sodium" or "no salt added."  Buy fresh foods. Avoid canned foods and premade or frozen meals. Cooking  Avoid adding salt when cooking. Use salt-free seasonings or herbs instead of table salt or sea salt. Check with your health care provider or pharmacist before using salt substitutes.  Do not fry foods. Cook foods using healthy methods such as baking, boiling, grilling, and broiling instead.  Cook with  heart-healthy oils, such as olive, canola, soybean, or sunflower oil. Meal planning   Eat a balanced diet that includes: ? 5 or more servings of fruits and vegetables each day. At each meal, try to fill half of your plate with fruits and vegetables. ? Up to 6-8 servings of whole grains each day. ? Less than 6 oz of lean meat, poultry, or fish each day. A 3-oz serving of meat is about the same size as a deck of cards. One egg equals 1 oz. ? 2 servings of low-fat dairy each day. ? A serving of nuts, seeds, or beans 5 times each week. ? Heart-healthy fats. Healthy fats called Omega-3 fatty acids are found in foods such as flaxseeds and coldwater fish, like sardines, salmon, and mackerel.  Limit how much you eat of the following: ? Canned or prepackaged foods. ? Food that is high in trans fat, such as fried foods. ? Food that is high in saturated fat, such as fatty meat. ? Sweets, desserts, sugary drinks, and other foods with added sugar. ? Full-fat dairy products.  Do not salt foods before eating.  Try to eat at least 2 vegetarian meals each week.  Eat more home-cooked food and less restaurant, buffet, and fast food.  When eating at a restaurant, ask that your food be prepared with less salt or no salt, if possible. What foods are recommended? The items listed may not be a complete list. Talk with your dietitian about what   dietary choices are best for you. Grains Whole-grain or whole-wheat bread. Whole-grain or whole-wheat pasta. Brown rice. Oatmeal. Quinoa. Bulgur. Whole-grain and low-sodium cereals. Pita bread. Low-fat, low-sodium crackers. Whole-wheat flour tortillas. Vegetables Fresh or frozen vegetables (raw, steamed, roasted, or grilled). Low-sodium or reduced-sodium tomato and vegetable juice. Low-sodium or reduced-sodium tomato sauce and tomato paste. Low-sodium or reduced-sodium canned vegetables. Fruits All fresh, dried, or frozen fruit. Canned fruit in natural juice (without  added sugar). Meat and other protein foods Skinless chicken or turkey. Ground chicken or turkey. Pork with fat trimmed off. Fish and seafood. Egg whites. Dried beans, peas, or lentils. Unsalted nuts, nut butters, and seeds. Unsalted canned beans. Lean cuts of beef with fat trimmed off. Low-sodium, lean deli meat. Dairy Low-fat (1%) or fat-free (skim) milk. Fat-free, low-fat, or reduced-fat cheeses. Nonfat, low-sodium ricotta or cottage cheese. Low-fat or nonfat yogurt. Low-fat, low-sodium cheese. Fats and oils Soft margarine without trans fats. Vegetable oil. Low-fat, reduced-fat, or light mayonnaise and salad dressings (reduced-sodium). Canola, safflower, olive, soybean, and sunflower oils. Avocado. Seasoning and other foods Herbs. Spices. Seasoning mixes without salt. Unsalted popcorn and pretzels. Fat-free sweets. What foods are not recommended? The items listed may not be a complete list. Talk with your dietitian about what dietary choices are best for you. Grains Baked goods made with fat, such as croissants, muffins, or some breads. Dry pasta or rice meal packs. Vegetables Creamed or fried vegetables. Vegetables in a cheese sauce. Regular canned vegetables (not low-sodium or reduced-sodium). Regular canned tomato sauce and paste (not low-sodium or reduced-sodium). Regular tomato and vegetable juice (not low-sodium or reduced-sodium). Pickles. Olives. Fruits Canned fruit in a light or heavy syrup. Fried fruit. Fruit in cream or butter sauce. Meat and other protein foods Fatty cuts of meat. Ribs. Fried meat. Bacon. Sausage. Bologna and other processed lunch meats. Salami. Fatback. Hotdogs. Bratwurst. Salted nuts and seeds. Canned beans with added salt. Canned or smoked fish. Whole eggs or egg yolks. Chicken or turkey with skin. Dairy Whole or 2% milk, cream, and half-and-half. Whole or full-fat cream cheese. Whole-fat or sweetened yogurt. Full-fat cheese. Nondairy creamers. Whipped toppings.  Processed cheese and cheese spreads. Fats and oils Butter. Stick margarine. Lard. Shortening. Ghee. Bacon fat. Tropical oils, such as coconut, palm kernel, or palm oil. Seasoning and other foods Salted popcorn and pretzels. Onion salt, garlic salt, seasoned salt, table salt, and sea salt. Worcestershire sauce. Tartar sauce. Barbecue sauce. Teriyaki sauce. Soy sauce, including reduced-sodium. Steak sauce. Canned and packaged gravies. Fish sauce. Oyster sauce. Cocktail sauce. Horseradish that you find on the shelf. Ketchup. Mustard. Meat flavorings and tenderizers. Bouillon cubes. Hot sauce and Tabasco sauce. Premade or packaged marinades. Premade or packaged taco seasonings. Relishes. Regular salad dressings. Where to find more information:  National Heart, Lung, and Blood Institute: www.nhlbi.nih.gov  American Heart Association: www.heart.org Summary  The DASH eating plan is a healthy eating plan that has been shown to reduce high blood pressure (hypertension). It may also reduce your risk for type 2 diabetes, heart disease, and stroke.  With the DASH eating plan, you should limit salt (sodium) intake to 2,300 mg a day. If you have hypertension, you may need to reduce your sodium intake to 1,500 mg a day.  When on the DASH eating plan, aim to eat more fresh fruits and vegetables, whole grains, lean proteins, low-fat dairy, and heart-healthy fats.  Work with your health care provider or diet and nutrition specialist (dietitian) to adjust your eating plan to your individual   calorie needs. This information is not intended to replace advice given to you by your health care provider. Make sure you discuss any questions you have with your health care provider. Document Released: 11/11/2011 Document Revised: 11/15/2016 Document Reviewed: 11/15/2016 Elsevier Interactive Patient Education  2018 Elsevier Inc.  

## 2018-11-15 MED FILL — TRUE METRIX TEST STRIP: 25 days supply | Qty: 100 | Fill #0

## 2018-11-15 MED FILL — !LANTUS SOLOSTAR 100UNITS/M: 100 | 15 days supply | Qty: 9 | Fill #1

## 2018-11-15 MED FILL — TRUEplus LANCETS 28G MISC: 25 days supply | Qty: 100 | Fill #0

## 2018-11-21 ENCOUNTER — Encounter: Payer: Self-pay | Admitting: Physician Assistant

## 2018-11-24 ENCOUNTER — Telehealth: Payer: Self-pay | Admitting: Licensed Clinical Social Worker

## 2018-11-24 NOTE — Telephone Encounter (Signed)
LCSWA placed call to patient. LCSWA introduced self and explained role at [facility]. Pt was informed of consult from PCP to address behavioral health and/or psychosocial needs. Pt scheduled appointment to meet with LCSWA for Monday, December 23, 19.

## 2018-11-27 ENCOUNTER — Ambulatory Visit: Payer: Self-pay | Attending: Family Medicine | Admitting: Licensed Clinical Social Worker

## 2018-11-27 DIAGNOSIS — Z5900 Homelessness unspecified: Secondary | ICD-10-CM

## 2018-11-27 DIAGNOSIS — F439 Reaction to severe stress, unspecified: Secondary | ICD-10-CM

## 2018-11-27 NOTE — BH Specialist Note (Signed)
Integrated Behavioral Health Initial Visit  MRN: [id number] Name: Sarah Long  Number of [place] Clinician visits:: 1/6 Session Start time: 1:40 PM  Session End time: 2:10 PM Total time: 30 minutes  Type of Service: [place] Interpretor:No. Interpretor Name and Language: N/A   SUBJECTIVE: Sarah Long is a 47 y.o. female accompanied by self Patient was referred by Lynnell Grain for housing resources. Patient reports the following symptoms/concerns: Pt shared that she is currently residing at a local shelter with her partner and their children. Pt's 90 days will be up on December 22, 2018 Duration of problem: Ongoing; Severity of problem: na  OBJECTIVE: Mood: Anxious and Affect: Appropriate Risk of harm to self or others: No plan to harm self or others  LIFE CONTEXT: Family and Social: Pt receives support from partner. 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 The children receive Medicaid and there is an active appeal for social security. Pt has scheduled appointment for financial counseling on 12/11/18  Self-Care: Pt participates in psychotherapy and medication management. She is active in the church Life Changes: Pt shared that she is currently residing at a local shelter with her partner and their children. Pt's 90 days will be up on December 22, 2018  GOALS ADDRESSED: Patient will: 1. Reduce symptoms of: stress 2. Increase knowledge and/or ability of: coping skills and self-management skills  3. Demonstrate ability to: Increase healthy adjustment to current life circumstances and Increase adequate support systems for patient/family  INTERVENTIONS: Interventions utilized: Solution-Focused Strategies, Supportive Counseling, Psychoeducation and/or Health Education and Link to [employer]  Standardized  Assessments completed: Not Needed  ASSESSMENT: Patient currently experiencing stress triggered by current homelessness. Pt shared that she is currently residing at a local shelter with her partner and their children. Pt's 90 days will be up on December 22, 2018. Pt receives strong support in the community and may benefit from community resources.  LCSWA and patient discussed various housing programs in the [place]. Pt has a case Freight forwarder through [employer] and was knowledgeable of resources for housing, food insecurity, and financial assistance. She demonstrated strong insight on how homelessness has negatively impacted her mental and physical health. Coping strategies were discussed to decrease symptoms and increase positive thoughts.   PLAN: 1. Follow up with behavioral health clinician on : Pt was encouraged to contact LCSWA if symptoms worsen or fail to improve to schedule behavioral appointments at [facility]. 2. Behavioral recommendations: LCSWA recommends that pt apply healthy coping skills discussed and utilize provided resources. Pt is encouraged to schedule follow up appointment with LCSWA, if needed 3. Referral(s): [place] (In Clinic) and [employer]:  Food, Publishing rights manager and Housing 4. "From scale of 1-10, how likely are you to follow plan?":   Rebekah Chesterfield, LCSW 12/04/18 4:28 PM

## 2018-12-11 ENCOUNTER — Ambulatory Visit: Payer: Self-pay | Attending: Family Medicine

## 2018-12-29 ENCOUNTER — Ambulatory Visit: Payer: Medicaid Other

## 2019-01-09 ENCOUNTER — Ambulatory Visit (INDEPENDENT_AMBULATORY_CARE_PROVIDER_SITE_OTHER): Payer: Medicaid Other | Admitting: Family Medicine

## 2019-04-18 NOTE — Progress Notes (Signed)
[place] Screening performed. Temperature, PHQ-9, and need for medical care and medications assessed. Patient cried during entire meeting. She has several children that she cares for alone and lost her job because of [place] and was living in her car. Some of her children went with family, but she could find help for all of them. She would like to start working on getting housing and an application for an [employer] as she has no medical coverage. Her PHQ-9=14.She reports having type II diabetes and bi-polar disorder. She has had some appointments with [facility] the [place].  Referral made to [facility].   Arnold Long RN MSN

## 2019-04-23 ENCOUNTER — Telehealth: Payer: Self-pay

## 2019-04-23 NOTE — Telephone Encounter (Signed)
    Spoke with Sarah Long, to discuss her need for provider to help manage her  Bipolar, Hypertension and Type II  Diabetes. Referral made to [facility] re: Dr.Wright, 5/20 2:30

## 2019-04-25 NOTE — Progress Notes (Signed)
[place] Screening performed. Temperature, PHQ-9, and need for medical care and medications assessed. Patient requested assistance with finding housing. Referral sent to [facility].  Arnold Long RN MSN

## 2019-04-30 ENCOUNTER — Telehealth: Payer: Self-pay

## 2019-04-30 NOTE — Telephone Encounter (Signed)
Spoke with Mrs. Bergevin on 5/22 re: missed appointment with Dr. Joya Gaskins. Rescheduled for 5/27 @2 :30.at [place]. Will require a primary care provider, and medication refill

## 2019-05-02 ENCOUNTER — Encounter: Payer: Self-pay | Admitting: Critical Care Medicine

## 2019-05-02 ENCOUNTER — Other Ambulatory Visit: Payer: Self-pay | Admitting: Critical Care Medicine

## 2019-05-02 DIAGNOSIS — Z794 Long term (current) use of insulin: Secondary | ICD-10-CM

## 2019-05-02 DIAGNOSIS — E1142 Type 2 diabetes mellitus with diabetic polyneuropathy: Secondary | ICD-10-CM

## 2019-05-02 DIAGNOSIS — E7800 Pure hypercholesterolemia, unspecified: Secondary | ICD-10-CM

## 2019-05-02 DIAGNOSIS — I10 Essential (primary) hypertension: Secondary | ICD-10-CM

## 2019-05-02 MED ORDER — AMLODIPINE BESYLATE 10 MG PO TABS: 10.0000 mg | ORAL_TABLET | Freq: Every day | ORAL | 3 refills | Status: DC

## 2019-05-02 MED ORDER — INSULIN PEN NEEDLE 31G X 8 MM MISC: 1.0000 "application " | Freq: Every day | 3 refills | Status: DC

## 2019-05-02 MED ORDER — ATORVASTATIN CALCIUM 40 MG PO TABS: 40.0000 mg | ORAL_TABLET | Freq: Every day | ORAL | 3 refills | Status: DC

## 2019-05-02 MED ORDER — GLUCOSE BLOOD VI STRP: 1.0000 | ORAL_STRIP | 3 refills | Status: DC

## 2019-05-02 MED ORDER — METFORMIN HCL 850 MG PO TABS: 850.0000 mg | ORAL_TABLET | Freq: Two times a day (BID) | ORAL | 3 refills | Status: DC

## 2019-05-02 MED ORDER — HYDROCHLOROTHIAZIDE 25 MG PO TABS: 25.0000 mg | ORAL_TABLET | Freq: Every day | ORAL | 3 refills | Status: DC

## 2019-05-02 MED ORDER — INSULIN GLARGINE 100 UNIT/ML ~~LOC~~ SOLN: 60.0000 [IU] | Freq: Every day | SUBCUTANEOUS | 5 refills | Status: DC

## 2019-05-02 MED ORDER — LANCETS 30G MISC: 1.0000 | Freq: Three times a day (TID) | 3 refills | Status: DC

## 2019-05-02 MED ORDER — ASPIRIN EC 81 MG PO TBEC: 81.0000 mg | DELAYED_RELEASE_TABLET | Freq: Every day | ORAL | 3 refills | Status: DC

## 2019-05-03 ENCOUNTER — Other Ambulatory Visit: Payer: Self-pay | Admitting: Pharmacist

## 2019-05-03 MED ORDER — BASAGLAR KWIKPEN 100 UNIT/ML ~~LOC~~ SOPN: 60.0000 [IU] | PEN_INJECTOR | Freq: Every day | SUBCUTANEOUS | 2 refills | Status: DC

## 2019-05-03 MED FILL — TRUEplus LANCETS 28G MISC: 25 days supply | Qty: 100 | Fill #0

## 2019-05-03 MED FILL — metFORMIN HCL 850 MG TABS: 850 | 30 days supply | Qty: 60 | Fill #0

## 2019-05-03 MED FILL — TRUEplus 5-BEVEL PEN NEEDLE: 31G X 8 MM | 25 days supply | Qty: 100 | Fill #0

## 2019-05-03 MED FILL — ?BASAGLAR 100 UNITS/ML KWPE: 100 | 25 days supply | Qty: 15 | Fill #0

## 2019-05-03 MED FILL — ?ATORVASTATIN 40MG TABLET: 40 | 30 days supply | Qty: 30 | Fill #0

## 2019-05-03 MED FILL — TRUE METRIX GLUCOSE TEST ST: 25 days supply | Qty: 100 | Fill #0

## 2019-05-03 MED FILL — ?HYDROCHLOROTHIAZIDE 25MG T: 25 | 30 days supply | Qty: 30 | Fill #0

## 2019-05-03 MED FILL — ?AMLODIPINE BESYLATE 10 MG: 10 | 30 days supply | Qty: 30 | Fill #0

## 2019-05-03 NOTE — Progress Notes (Signed)
COVID Hotel Screening performed. Temperature, PHQ-9, and need for medical care and medications assessed. No additional needs assessed at this time.  Janann Boeve RN MSN 

## 2019-05-03 NOTE — Progress Notes (Signed)
Patient ID: Sarah Long, female   DOB: 1971-01-26, 48 y.o.   MRN: [id number]  This is a 48 year old female with history of type 2 diabetes hypertension and bipolar disorder.  Patient formerly was followed at the [place] clinic.  She was last seen in December by the physician assistant that recently staffed that clinic.  She has since now run out of her insulin and blood pressure medicines for 1 month.  She works in Water engineer.  She states that she has had increased anxiety recently.  She is currently living in a hotel with her 3 small children.  She denies headaches chest pain or shortness of breath.  On exam blood pressure 158/92 temp is 97 pulse is 90 saturation 99% room air chest exam showed to be clear cardiac exam showed a regular rate and rhythm normal S1-S2 abdomen was soft nontender extremities showed dry skin the foot exam was done and was unremarkable with good pulses  CBG today was 278  Impression is that of type 2 diabetes and hypertension poorly controlled due to lack of access to medications  Recommendations today will be to prescribe metformin at 850 mg twice daily and begin Lantus at 60 units daily glucose monitoring device and strips and testing strips were sent for refills as well.  The patient also will resume atorvastatin 40 mg daily aspirin 81 mg daily and amlodipine 10 mg daily along with hydrochlorthiazide 25 mg daily.  We will bring the patient into the office for a follow-up visit within the next 2 weeks.

## 2019-05-03 NOTE — Progress Notes (Signed)
Switch for [facility] free stock per auto-substitution policy.

## 2019-05-10 ENCOUNTER — Telehealth: Payer: Self-pay | Admitting: Physician Assistant

## 2019-05-10 ENCOUNTER — Other Ambulatory Visit (HOSPITAL_COMMUNITY): Payer: Self-pay

## 2019-05-10 DIAGNOSIS — Z20822 Contact with and (suspected) exposure to covid-19: Secondary | ICD-10-CM

## 2019-05-10 NOTE — Telephone Encounter (Signed)
Called patient and was unable to schedule an appt with Dr. Joya Gaskins. Patient needs to re establish care with Dr. Joya Gaskins.

## 2019-05-11 ENCOUNTER — Other Ambulatory Visit: Payer: Self-pay

## 2019-05-11 DIAGNOSIS — Z20822 Contact with and (suspected) exposure to covid-19: Secondary | ICD-10-CM

## 2019-05-11 NOTE — Progress Notes (Signed)
lab

## 2019-05-14 LAB — NOVEL CORONAVIRUS, NAA: SARS-CoV-2, NAA: NOT DETECTED

## 2019-05-14 NOTE — Progress Notes (Signed)
COVID Hotel Screening performed. COVID screening, temperature, and need for medical care and medications assessed. Patient agreed to the COVID-19 testing. No additional needs assessed at this time.  Lynn Sissel RN MSN 

## 2019-05-24 NOTE — Progress Notes (Signed)
[place] Screening performed. Temperature, PHQ-9, and need for medical care and medications assessed. No additional needs assessed at this time.

## 2019-05-28 NOTE — Progress Notes (Signed)
Subjective:    Patient ID: Sarah Long, female    DOB: 1971/05/01, 48 y.o.   MRN: [id number]  This is a 48 year old female with history of type 2 diabetes and bipolar disorder.  She formerly was followed at the [place] [facility].  She was last seen in December by the physician assistant.  I then saw the patient at the [place] [facility] several weeks ago when she had run out of her insulin and blood pressure medications for 1 month.  She had worked previously in [employer].  She has now suffered homelessness and is living in a hotel with 3 small children.  The patient now is on the Lantus 60 units daily and metformin 850 mg twice daily.  She states that she has difficulty eating regularly.  She receives mostly processed foods including boiled eggs orange juice bacon and Saulsberry steaks and sandwich meat.  Blood sugars will run anywhere from 180 in the morning to as high as 290 in the midday.  The patient is also on Latuda now at 40 mg daily.  Blood pressure also still running in the 140/80 range and she has been compliant with the hydrochlorthiazide and amlodipine.  The patient complains of bilateral foot pain and she fell in October and injured her right shoulder and has had pain in the upper portion of the shoulder itself  Note the patient is also still smoking 1 pack a day of cigarettes  There is no alcohol use   Past Medical History:  Diagnosis Date  . Abnormal Pap smear 1998  . Abnormal vaginal bleeding 12/19/2011  . Bacterial infection   . Bipolar 1 disorder ([place])   . Blister of second toe of left foot 11/21/2016  . Candida vaginitis 07/2007  . Depression    recently added wellbutrin-has not taken yet for bipolar  . Diabetes in pregnancy   . Diabetes mellitus    nph 20U qam and qpm, regular with meals  . Fibroid   . Galactorrhea of right breast 2008  . H/O amenorrhea 07/2007  . H/O dizziness 10/14/11  . H/O dysmenorrhea 2010  . H/O menorrhagia 10/14/11  . H/O  varicella   . Headache(784.0)   . Heavy vaginal bleeding due to contraceptive injection use 10/12/2011   Depo Provera  . Herpes   . HSV-2 infection 01/03/2009  . Hx: UTI (urinary tract infection) 2009  . Hypertension    on aldomet  . Increased BMI 2010  . Irregular uterine bleeding 04/04/2012   Pt has mirena   . Obesity 10/14/11  . Oligomenorrhea 07/2007  . Pelvic pain in female   . Preterm labor   . Trichomonas   . Yeast infection      Family History  Problem Relation Age of Onset  . Hypertension Mother   . Diabetes Mother   . Heart disease Mother   . Hypertension Father   . Diabetes Father   . Heart disease Father   . Stroke Maternal Grandfather   . Other Neg Hx      Social History   Socioeconomic History  . Marital status: Married    Spouse name: Not on file  . Number of children: Not on file  . Years of education: Not on file  . Highest education level: Not on file  Occupational History  . Not on file  Social Needs  . Financial resource strain: Not on file  . Food insecurity    Worry: Not on file    Inability:  Not on file  . Transportation needs    Medical: Not on file    Non-medical: Not on file  Tobacco Use  . Smoking status: Current Every Day Smoker    Packs/day: 0.50    Years: 20.00    Pack years: 10.00    Types: Cigarettes  . Smokeless tobacco: Never Used  Substance and Sexual Activity  . Alcohol use: No  . Drug use: No  . Sexual activity: Yes    Birth control/protection: None  Lifestyle  . Physical activity    Days per week: Not on file    Minutes per session: Not on file  . Stress: Not on file  Relationships  . Social Herbalist on phone: Not on file    Gets together: Not on file    Attends religious service: Not on file    Active member of club or organization: Not on file    Attends meetings of clubs or organizations: Not on file    Relationship status: Not on file  . Intimate partner violence    Fear of current or ex  partner: Not on file    Emotionally abused: Not on file    Physically abused: Not on file    Forced sexual activity: Not on file  Other Topics Concern  . Not on file  Social History Narrative  . Not on file     Allergies  Allergen Reactions  . Strawberry Extract Hives  . Sulfa Antibiotics Hives and Itching     Outpatient Medications Prior to Visit  Medication Sig Dispense Refill  . amLODipine (NORVASC) 10 MG tablet Take 1 tablet (10 mg total) by mouth daily. 90 tablet 3  . aspirin EC 81 MG tablet Take 1 tablet (81 mg total) by mouth daily. 60 tablet 3  . atorvastatin (LIPITOR) 40 MG tablet Take 1 tablet (40 mg total) by mouth daily. 90 tablet 3  . blood glucose meter kit and supplies KIT Dispense based on patient and insurance preference. Use up to four times daily as directed. (FOR ICD-9 250.00, 250.01). 1 each 0  . Blood Glucose Monitoring Suppl (TRUE METRIX METER) w/Device KIT 1 each by Does not apply route as needed. 1 kit 0  . glucose blood (TRUE METRIX BLOOD GLUCOSE TEST) test strip 1 each by Other route every morning. 90 each 3  . Insulin Pen Needle (B-D ULTRAFINE III SHORT PEN) 31G X 8 MM MISC 1 application by Does not apply route daily. 90 each 3  . Lancet Devices (ADJUSTABLE LANCING DEVICE) MISC Inject 1 each into the skin 3 (three) times daily. 100 each 5  . Lancets 30G MISC 1 each by Does not apply route 3 (three) times daily. 100 each 3  . lurasidone (LATUDA) 40 MG TABS tablet Take 40 mg by mouth daily with breakfast.    . metFORMIN (GLUCOPHAGE) 850 MG tablet Take 1 tablet (850 mg total) by mouth 2 (two) times daily with a meal. 60 tablet 3  . hydrochlorothiazide (HYDRODIURIL) 25 MG tablet Take 1 tablet (25 mg total) by mouth daily. 90 tablet 3  . Insulin Glargine (BASAGLAR KWIKPEN) 100 UNIT/ML SOPN Inject 0.6 mLs (60 Units total) into the skin daily. 15 mL 2   No facility-administered medications prior to visit.       Review of Systems  Constitutional: Positive for  appetite change. Negative for fatigue, fever and unexpected weight change.  HENT: Negative.   Eyes: Negative for visual disturbance.  Respiratory:  Negative for apnea, cough, choking, chest tightness, shortness of breath, wheezing and stridor.   Cardiovascular: Negative for chest pain, palpitations and leg swelling.  Gastrointestinal: Negative.   Genitourinary: Negative.   Musculoskeletal:       R shoulder pain, bilateral foot pain  Skin: Negative.   Neurological: Negative.   Hematological: Negative.   Psychiatric/Behavioral: Positive for dysphoric mood. Negative for self-injury, sleep disturbance and suicidal ideas. The patient is nervous/anxious.        Objective:   Physical Exam Vitals:   05/29/19 1447 05/29/19 1450  BP: (!) 150/84 (!) 146/84  Pulse: 90 94  Temp: 98.1 F (36.7 C)   TempSrc: Oral   SpO2: 99%   Weight: 206 lb 9.6 oz (93.7 kg)   Height: '5\' 4"'$  (1.626 m)     Gen: Pleasant, well-nourished, in no distress,  normal affect  ENT: No lesions,  mouth clear,  oropharynx clear, no postnasal drip  Neck: No JVD, no TMG, no carotid bruits  Lungs: No use of accessory muscles, no dullness to percussion, clear without rales or rhonchi  Cardiovascular: RRR, heart sounds normal, no murmur or gallops, no peripheral edema  Abdomen: soft and NT, no HSM,  BS normal  Musculoskeletal: No deformities, no cyanosis or clubbing R shoulder with limited ROM,  Pain at the [facility] joint  Neuro: alert, non focal  Skin: Warm, no lesions or rashes Foot exam:  Bilateral bunions, decreased sensation, normal pulses      Assessment & Plan:  I personally reviewed all images and lab data in the [facility] system as well as any outside material available during this office visit and agree with the  radiology impressions.   HYPERTENSION, BENIGN SYSTEMIC Hypertension still not well controlled  Will change hydrochlorothiazide to losartan/hydrochlorthiazide 100 mg/25 1 daily  Continue amlodipine 10  mg daily  Follow-up basic metabolic panel at this visit  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 block) History of right bundle branch block and a holosystolic murmur  We will obtain echocardiogram for further evaluation  Holosystolic murmur Holosystolic murmur that was identified previously still persistent  We will obtain echocardiogram for further evaluation  Bilateral bunions Bilateral bunions with plantar foot pain  Will refer to podiatry  Diabetes type 2, uncontrolled ([place]) Type 2 diabetes poorly controlled  Will increase Lantus to 70 units daily continue metformin 850 mg twice daily and refer to clinical pharmacy for further review  Bipolar 1 disorder ([place]) Bipolar 1 disorder [facility] [place]  I asked the patient to please continue follow-up with psychiatry  Chronic right shoulder pain Chronic right shoulder pain with discomfort at the [facility] joint on the right status post a fall several months ago  I will refer to orthopedics for further evaluation and give ibuprofen 600 mg every 8 hours as needed for pain until the appointment occurs  Smoking Ongoing tobacco use  5 minutes of smoking cessation counseling was given and I suggested the patient begin nicotine lozenge 4 mg 2-3 times daily   Diagnoses and all orders for this visit:  Uncontrolled type 2 diabetes mellitus with hyperglycemia ([place]) -     Basic metabolic panel; Future -     Basic metabolic panel  HYPERTENSION, BENIGN SYSTEMIC -     Basic metabolic panel; Future -     Basic metabolic panel  Smoking  Chronic right shoulder pain -     Ambulatory referral to Orthopedic Surgery  Bilateral bunions -  Ambulatory referral to Podiatry  Holosystolic murmur -     ECHOCARDIOGRAM COMPLETE; Future  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 block) -     ECHOCARDIOGRAM COMPLETE; Future  Bipolar 1 disorder ([facility])  Other  orders -     Insulin Glargine (BASAGLAR KWIKPEN) 100 UNIT/ML SOPN; Inject 0.7 mLs (70 Units total) into the skin daily. -     losartan-hydrochlorothiazide (HYZAAR) 100-25 MG tablet; Take 1 tablet by mouth daily. -     nicotine polacrilex (NICOTINE MINI) 4 MG lozenge; Take 1 lozenge (4 mg total) by mouth as needed for smoking cessation. -     ibuprofen (ADVIL) 600 MG tablet; Take 1 tablet (600 mg total) by mouth every 8 (eight) hours as needed.

## 2019-05-29 ENCOUNTER — Ambulatory Visit: Payer: Self-pay | Attending: Critical Care Medicine | Admitting: Critical Care Medicine

## 2019-05-29 ENCOUNTER — Other Ambulatory Visit: Payer: Self-pay

## 2019-05-29 ENCOUNTER — Encounter: Payer: Self-pay | Admitting: Critical Care Medicine

## 2019-05-29 VITALS — BP 146/84 | HR 94 | Temp 98.1°F | Ht 64.0 in | Wt 206.6 lb

## 2019-05-29 DIAGNOSIS — I452 Bifascicular block: Secondary | ICD-10-CM | POA: Insufficient documentation

## 2019-05-29 DIAGNOSIS — F17200 Nicotine dependence, unspecified, uncomplicated: Secondary | ICD-10-CM

## 2019-05-29 DIAGNOSIS — M21612 Bunion of left foot: Secondary | ICD-10-CM

## 2019-05-29 DIAGNOSIS — I10 Essential (primary) hypertension: Secondary | ICD-10-CM

## 2019-05-29 DIAGNOSIS — E1165 Type 2 diabetes mellitus with hyperglycemia: Secondary | ICD-10-CM

## 2019-05-29 DIAGNOSIS — M25511 Pain in right shoulder: Secondary | ICD-10-CM

## 2019-05-29 DIAGNOSIS — G8929 Other chronic pain: Secondary | ICD-10-CM | POA: Insufficient documentation

## 2019-05-29 DIAGNOSIS — R011 Cardiac murmur, unspecified: Secondary | ICD-10-CM

## 2019-05-29 DIAGNOSIS — M21611 Bunion of right foot: Secondary | ICD-10-CM

## 2019-05-29 DIAGNOSIS — F319 Bipolar disorder, unspecified: Secondary | ICD-10-CM

## 2019-05-29 MED ORDER — LOSARTAN POTASSIUM-HCTZ 100-25 MG PO TABS: 1.0000 | ORAL_TABLET | Freq: Every day | ORAL | 3 refills | Status: DC

## 2019-05-29 MED ORDER — NICOTINE POLACRILEX 4 MG MT LOZG: 4.0000 mg | LOZENGE | OROMUCOSAL | 0 refills | Status: DC | PRN

## 2019-05-29 MED ORDER — BASAGLAR KWIKPEN 100 UNIT/ML ~~LOC~~ SOPN: 70.0000 [IU] | PEN_INJECTOR | Freq: Every day | SUBCUTANEOUS | 2 refills | Status: DC

## 2019-05-29 MED ORDER — IBUPROFEN 600 MG PO TABS: 600.0000 mg | ORAL_TABLET | Freq: Three times a day (TID) | ORAL | 0 refills | Status: DC | PRN

## 2019-05-29 MED FILL — SM NICOTINE 4 MG LOZENGE: 4 | 72 days supply | Qty: 72 | Fill #0

## 2019-05-29 MED FILL — LOSARTAN-HCTZ 100-25 MG TAB: 100-25 | 30 days supply | Qty: 30 | Fill #0

## 2019-05-29 MED FILL — ?BASAGLAR 100 UNITS/ML KWPE: 100 | 30 days supply | Qty: 21 | Fill #0

## 2019-05-29 NOTE — Assessment & Plan Note (Signed)
Hypertension still not well controlled  Will change hydrochlorothiazide to losartan/hydrochlorthiazide 100 mg/25 1 daily  Continue amlodipine 10 mg daily  Follow-up basic metabolic panel at this visit

## 2019-05-29 NOTE — Assessment & Plan Note (Signed)
History of right bundle branch block and a holosystolic murmur  We will obtain echocardiogram for further evaluation

## 2019-05-29 NOTE — Assessment & Plan Note (Signed)
Bilateral bunions with plantar foot pain  Will refer to podiatry

## 2019-05-29 NOTE — Assessment & Plan Note (Signed)
Chronic right shoulder pain with discomfort at the [facility] joint on the right status post a fall several months ago  I will refer to orthopedics for further evaluation and give ibuprofen 600 mg every 8 hours as needed for pain until the appointment occurs

## 2019-05-29 NOTE — Patient Instructions (Addendum)
Labs today will include metabolic panel  Referrals will be made to orthopedic surgery and podiatry  An echocardiogram will be obtained  Increase Lantus to 70 units daily  Change hydrochlorthiazide to losartan/hydrochlorthiazide 1 daily  Try taking your Latuda in the morning  Focus on smoking cessation  We would like for you to see our clinical pharmacist to discuss your diabetes care  Return in 6 weeks

## 2019-05-29 NOTE — Progress Notes (Signed)
Follow up on her medications to make sure they are working and to est care with a provider.   Patient CBG today is 288 with her meter in the office.

## 2019-05-29 NOTE — Assessment & Plan Note (Signed)
Type 2 diabetes poorly controlled  Will increase Lantus to 70 units daily continue metformin 850 mg twice daily and refer to clinical pharmacy for further review

## 2019-05-29 NOTE — Assessment & Plan Note (Signed)
Bipolar 1 disorder under care of psychiatry [facility] [place]  I asked the patient to please continue follow-up with psychiatry

## 2019-05-29 NOTE — Assessment & Plan Note (Signed)
Ongoing tobacco use  5 minutes of smoking cessation counseling was given and I suggested the patient begin nicotine lozenge 4 mg 2-3 times daily

## 2019-05-29 NOTE — Assessment & Plan Note (Signed)
Holosystolic murmur that was identified previously still persistent  We will obtain echocardiogram for further evaluation

## 2019-05-30 ENCOUNTER — Other Ambulatory Visit: Payer: Self-pay | Admitting: Critical Care Medicine

## 2019-05-30 DIAGNOSIS — E1165 Type 2 diabetes mellitus with hyperglycemia: Secondary | ICD-10-CM

## 2019-05-30 DIAGNOSIS — E1142 Type 2 diabetes mellitus with diabetic polyneuropathy: Secondary | ICD-10-CM | POA: Insufficient documentation

## 2019-05-30 LAB — BASIC METABOLIC PANEL
BUN/Creatinine Ratio: 16 (ref 9–23)
BUN: 10 mg/dL (ref 6–24)
CO2: 18 mmol/L — ABNORMAL LOW (ref 20–29)
Calcium: 9.3 mg/dL (ref 8.7–10.2)
Chloride: 105 mmol/L (ref 96–106)
Creatinine, Ser: 0.64 mg/dL (ref 0.57–1.00)
GFR calc Af Amer: 125 mL/min/{1.73_m2} (ref >59)
GFR calc non Af Amer: 108 mL/min/{1.73_m2} (ref >59)
Glucose: 254 mg/dL — ABNORMAL HIGH (ref 65–99)
Potassium: 4.2 mmol/L (ref 3.5–5.2)
Sodium: 136 mmol/L (ref 134–144)

## 2019-05-30 MED FILL — IBUPROFEN 600 MG TABLET: 600 | 20 days supply | Qty: 60 | Fill #0

## 2019-05-31 NOTE — Progress Notes (Signed)
COVID Hotel Screening performed. Temperature, PHQ-9, and need for medical care and medications assessed. No additional needs assessed at this time.  Sarah Salminen  MSN, RN 

## 2019-06-05 ENCOUNTER — Ambulatory Visit: Payer: Medicaid Other | Admitting: Pharmacist

## 2019-06-13 ENCOUNTER — Other Ambulatory Visit: Payer: Self-pay

## 2019-06-13 ENCOUNTER — Ambulatory Visit (HOSPITAL_COMMUNITY)
Admission: RE | Admit: 2019-06-13 | Discharge: 2019-06-13 | Disposition: A | Discharge status: Home / Self Care | Payer: Self-pay | Source: Ambulatory Visit | Attending: Critical Care Medicine | Admitting: Critical Care Medicine

## 2019-06-13 DIAGNOSIS — I10 Essential (primary) hypertension: Secondary | ICD-10-CM | POA: Insufficient documentation

## 2019-06-13 DIAGNOSIS — R011 Cardiac murmur, unspecified: Secondary | ICD-10-CM

## 2019-06-13 DIAGNOSIS — F17200 Nicotine dependence, unspecified, uncomplicated: Secondary | ICD-10-CM | POA: Insufficient documentation

## 2019-06-13 DIAGNOSIS — E119 Type 2 diabetes mellitus without complications: Secondary | ICD-10-CM | POA: Insufficient documentation

## 2019-06-13 DIAGNOSIS — I452 Bifascicular block: Secondary | ICD-10-CM

## 2019-06-13 NOTE — Progress Notes (Signed)
  Echocardiogram 2D Echocardiogram has been performed.  Jannett Celestine 06/13/2019, 11:07 AM

## 2019-06-14 ENCOUNTER — Other Ambulatory Visit: Payer: Self-pay | Admitting: *Deleted

## 2019-06-14 DIAGNOSIS — Z20822 Contact with and (suspected) exposure to covid-19: Secondary | ICD-10-CM

## 2019-06-18 ENCOUNTER — Ambulatory Visit: Payer: Self-pay | Attending: Family Medicine | Admitting: Pharmacist

## 2019-06-18 ENCOUNTER — Other Ambulatory Visit: Payer: Self-pay

## 2019-06-18 DIAGNOSIS — E785 Hyperlipidemia, unspecified: Secondary | ICD-10-CM | POA: Insufficient documentation

## 2019-06-18 DIAGNOSIS — Z7901 Long term (current) use of anticoagulants: Secondary | ICD-10-CM | POA: Insufficient documentation

## 2019-06-18 DIAGNOSIS — I10 Essential (primary) hypertension: Secondary | ICD-10-CM | POA: Insufficient documentation

## 2019-06-18 DIAGNOSIS — Z833 Family history of diabetes mellitus: Secondary | ICD-10-CM | POA: Insufficient documentation

## 2019-06-18 DIAGNOSIS — F17210 Nicotine dependence, cigarettes, uncomplicated: Secondary | ICD-10-CM | POA: Insufficient documentation

## 2019-06-18 DIAGNOSIS — Z7984 Long term (current) use of oral hypoglycemic drugs: Secondary | ICD-10-CM | POA: Insufficient documentation

## 2019-06-18 DIAGNOSIS — Z79899 Other long term (current) drug therapy: Secondary | ICD-10-CM | POA: Insufficient documentation

## 2019-06-18 DIAGNOSIS — E1165 Type 2 diabetes mellitus with hyperglycemia: Secondary | ICD-10-CM | POA: Insufficient documentation

## 2019-06-18 LAB — POCT GLYCOSYLATED HEMOGLOBIN (HGB A1C): Hemoglobin A1C: 10.2 % — AB (ref 4.0–5.6)

## 2019-06-18 LAB — GLUCOSE, POCT (MANUAL RESULT ENTRY): POC Glucose: 277 mg/dl — AB (ref 70–99)

## 2019-06-18 NOTE — Progress Notes (Signed)
    S:    PCP: Dr. Joya Gaskins  No chief complaint on file.  Patient arrives in good spirits. Presents for diabetes evaluation, education, and management. Patient was referred and last seen by PCP on 05/29/19. Dr. Joya Gaskins increased patient's insulin dose.   Family/Social History:  - FHx: DM (mother, father); HD (mother, father); HTN (mother, father) - Tobacco: current 0.5 PPD smoker - Alcohol: denies use  Insurance coverage/medication affordability: Family Planning Medicaid; no rx drug coverage  Patient denies adherence with medications.  Current diabetes medications include: metformin 850 mg BID (stopped d/t diarrhea), Basaglar 70 units (only taking 60 daily) Current hypertension medications include: amlodipine 10 mg daily, losartan-hctz 100-25 mg daily Current hyperlipidemia medications include: atorvastatin 40 mg daily  Patient denies hypoglycemic events.  Patient reported dietary habits:  - Pt w/ hx of homelessness; now living in a hotel and eats what she can - She states that she has difficulty eating regularly.   - She receives mostly processed foods including boiled eggs, orange juice, bacon, and sandwich meats   Patient-reported exercise habits:  - Limited   Patient denies polyuria. Reports some polydipsia.  Patient reports neuropathy. Patient reports visual changes. Patient reports self foot exams.     O:  POCT glucose: 277  Home glucose levels: not checking consistently   Lab Results  Component Value Date   HGBA1C 10.2 (A) 06/18/2019   There were no vitals filed for this visit.  Lipid Panel     Component Value Date/Time   CHOL 120 03/17/2017 0000   TRIG 181 (H) 03/17/2017 0000   HDL 54 03/17/2017 0000   CHOLHDL 2.2 03/17/2017 0000   CHOLHDL 2.8 10/21/2016 1630   VLDL 23 10/21/2016 1630   LDLCALC 30 03/17/2017 0000   Clinical ASCVD: No  The ASCVD Risk score Mikey Bussing DC Jr., et al., 2013) failed to calculate for the following reasons:   The valid total  cholesterol range is 130 to 320 mg/dL    A/P: Diabetes longstanding currently uncontrolled. A1c today down to 10.2 but still above goal. Patient is able to verbalize appropriate hypoglycemia management plan. Patient is not adherent with medication. Control is suboptimal due to financial constraints, food availability, and medication non-adherence.   Will take metformin off of her list. She never took the 70 units of insulin as instructed. Will have her do this and follow-up with Dr. Joya Gaskins.  -Increase from 60 to 70 units of Basaglar.  -Discontinued metformin.  -Extensively discussed pathophysiology of DM, recommended lifestyle interventions, dietary effects on glycemic control -Counseled on s/sx of and management of hypoglycemia -Next A1C anticipated 10/20.   ASCVD risk - primary prevention in patient with DM. Last LDL in 2018, controlled at 30. Recommend updated lipid panel. Patient is currently on high intensity statin therapy.  -Continued atorvastatin 40 mg.   HM: UTD on vaccines.   Written patient instructions provided. Total time in face to face counseling 30 minutes.   Follow up w/ Dr. Joya Gaskins 07/10/19.    Benard Halsted, PharmD, [place] [phone]

## 2019-06-18 NOTE — Patient Instructions (Signed)
Thank you for coming to see me today. Please do the following:  1. Increase Lantus to 70 units daily. 2. Start checking blood sugars at home regularly. 3. Continue making the lifestyle changes we've discussed together during our visit. Diet and exercise play a significant role in improving your blood sugars.  4. Follow-up with Dr. Joya Gaskins next month.    Hypoglycemia or low blood sugar:   Low blood sugar can happen quickly and may become an emergency if not treated right away.   While this shouldn't happen often, it can be brought upon if you skip a meal or do not eat enough. Also, if your insulin or other diabetes medications are dosed too high, this can cause your blood sugar to go to low.   Warning signs of low blood sugar include: 1. Feeling shaky or dizzy 2. Feeling weak or tired  3. Excessive hunger 4. Feeling anxious or upset  5. Sweating even when you aren't exercising  What to do if I experience low blood sugar? 1. Check your blood sugar with your meter. If lower than 70, proceed to step 2.  2. Treat with 3-4 glucose tablets or 3 packets of regular sugar. If these aren't around, you can try hard candy. Yet another option would be to drink 4 ounces of fruit juice or 6 ounces of REGULAR soda.  3. Re-check your sugar in 15 minutes. If it is still below 70, do what you did in step 2 again. If has come back up, go ahead and eat a snack or small meal at this time.

## 2019-06-19 ENCOUNTER — Encounter: Payer: Self-pay | Admitting: Pharmacist

## 2019-06-19 ENCOUNTER — Ambulatory Visit: Payer: Medicaid Other | Admitting: Family Medicine

## 2019-06-21 LAB — NOVEL CORONAVIRUS, NAA: SARS-CoV-2, NAA: NOT DETECTED

## 2019-06-27 NOTE — Progress Notes (Signed)
COVID-19 Screening performed. Temperature, PHQ-9, and need for medical care and medications assessed. No additional needs assessed at this time.  Sun Wilensky MSN, RN 

## 2019-07-10 ENCOUNTER — Encounter: Payer: Self-pay | Admitting: Critical Care Medicine

## 2019-07-10 ENCOUNTER — Ambulatory Visit: Payer: Self-pay | Attending: Critical Care Medicine | Admitting: Critical Care Medicine

## 2019-07-10 ENCOUNTER — Other Ambulatory Visit: Payer: Self-pay

## 2019-07-10 VITALS — BP 140/83 | HR 97 | Temp 98.0°F | Ht 64.0 in | Wt 206.2 lb

## 2019-07-10 DIAGNOSIS — I10 Essential (primary) hypertension: Secondary | ICD-10-CM | POA: Insufficient documentation

## 2019-07-10 DIAGNOSIS — F17210 Nicotine dependence, cigarettes, uncomplicated: Secondary | ICD-10-CM | POA: Insufficient documentation

## 2019-07-10 DIAGNOSIS — Z590 Homelessness: Secondary | ICD-10-CM

## 2019-07-10 DIAGNOSIS — Z79899 Other long term (current) drug therapy: Secondary | ICD-10-CM | POA: Insufficient documentation

## 2019-07-10 DIAGNOSIS — Z833 Family history of diabetes mellitus: Secondary | ICD-10-CM | POA: Insufficient documentation

## 2019-07-10 DIAGNOSIS — F319 Bipolar disorder, unspecified: Secondary | ICD-10-CM | POA: Insufficient documentation

## 2019-07-10 DIAGNOSIS — E7800 Pure hypercholesterolemia, unspecified: Secondary | ICD-10-CM | POA: Insufficient documentation

## 2019-07-10 DIAGNOSIS — Z8249 Family history of ischemic heart disease and other diseases of the circulatory system: Secondary | ICD-10-CM | POA: Insufficient documentation

## 2019-07-10 DIAGNOSIS — Z794 Long term (current) use of insulin: Secondary | ICD-10-CM | POA: Insufficient documentation

## 2019-07-10 DIAGNOSIS — F17200 Nicotine dependence, unspecified, uncomplicated: Secondary | ICD-10-CM

## 2019-07-10 DIAGNOSIS — E669 Obesity, unspecified: Secondary | ICD-10-CM | POA: Insufficient documentation

## 2019-07-10 DIAGNOSIS — E1142 Type 2 diabetes mellitus with diabetic polyneuropathy: Secondary | ICD-10-CM | POA: Insufficient documentation

## 2019-07-10 DIAGNOSIS — Z7982 Long term (current) use of aspirin: Secondary | ICD-10-CM | POA: Insufficient documentation

## 2019-07-10 DIAGNOSIS — Z91018 Allergy to other foods: Secondary | ICD-10-CM | POA: Insufficient documentation

## 2019-07-10 DIAGNOSIS — Z882 Allergy status to sulfonamides status: Secondary | ICD-10-CM | POA: Insufficient documentation

## 2019-07-10 DIAGNOSIS — E1165 Type 2 diabetes mellitus with hyperglycemia: Secondary | ICD-10-CM | POA: Insufficient documentation

## 2019-07-10 LAB — POCT URINALYSIS DIP (CLINITEK)
Bilirubin, UA: NEGATIVE
Glucose, UA: 500 mg/dL — AB
Ketones, POC UA: NEGATIVE mg/dL
Leukocytes, UA: NEGATIVE
Nitrite, UA: NEGATIVE
POC PROTEIN,UA: 30 — AB
Spec Grav, UA: <=1.005 — AB (ref 1.010–1.025)
Urobilinogen, UA: 0.2 E.U./dL
pH, UA: 5.5 (ref 5.0–8.0)

## 2019-07-10 LAB — GLUCOSE, POCT (MANUAL RESULT ENTRY)
POC Glucose: 514 mg/dl — AB (ref 70–99)
POC Glucose: 362 mg/dl — AB (ref 70–99)

## 2019-07-10 MED ORDER — INSULIN ASPART 100 UNIT/ML ~~LOC~~ SOLN
10.0000 [IU] | Freq: Once | SUBCUTANEOUS | Status: AC
  Administered 2019-07-10: 14:00:00 10 [IU] via SUBCUTANEOUS

## 2019-07-10 MED ORDER — BASAGLAR KWIKPEN 100 UNIT/ML ~~LOC~~ SOPN: 80.0000 [IU] | PEN_INJECTOR | Freq: Every day | SUBCUTANEOUS | 2 refills | Status: DC

## 2019-07-10 MED FILL — ?BASAGLAR 100 UNITS/ML KWPE: 100 | 18 days supply | Qty: 15 | Fill #0

## 2019-07-10 NOTE — Progress Notes (Signed)
Subjective:    Patient ID: Sarah Long, female    DOB: 29-Nov-1971, 48 y.o.   MRN: [id number]  This is a 48 year old female history of type 2 diabetes and bipolar disorder.  Patient was last seen in June and was found to have hypertension not well controlled, a holosystolic murmur which was found to be physiologic with normal echocardiogram, type 2 diabetes poorly controlled, bipolar disorder, homelessness, and ongoing tobacco use.  The patient now is moved back to the homeless shelter away from the hotel she was housed in over the pandemic.  Note today on arrival blood sugar was greater than 500 she states she has not taken her insulin recently.  The patient has been compliant with her blood pressure medications however is still smoking a pack a day of cigarettes.  Patient has been compliant with her [name] that she receives from [facility] [place].  The patient does have bunions in the feet but is yet to have her appointment with podiatry   Past Medical History:  Diagnosis Date  . Abnormal Pap smear 1998  . Abnormal vaginal bleeding 12/19/2011  . Bacterial infection   . Bipolar 1 disorder ([place])   . Blister of second toe of left foot 11/21/2016  . Candida vaginitis 07/2007  . Depression    recently added wellbutrin-has not taken yet for bipolar  . Diabetes in pregnancy   . Diabetes mellitus    nph 20U qam and qpm, regular with meals  . Fibroid   . Galactorrhea of right breast 2008  . H/O amenorrhea 07/2007  . H/O dizziness 10/14/11  . H/O dysmenorrhea 2010  . H/O menorrhagia 10/14/11  . H/O varicella   . Headache(784.0)   . Heavy vaginal bleeding due to contraceptive injection use 10/12/2011   Depo Provera  . Herpes   . HSV-2 infection 01/03/2009  . Hx: UTI (urinary tract infection) 2009  . Hypertension    on aldomet  . Increased BMI 2010  . Irregular uterine bleeding 04/04/2012   Pt has mirena   . Obesity 10/14/11  . Oligomenorrhea 07/2007  . Pelvic pain in  female   . Preterm labor   . Trichomonas   . Yeast infection      Family History  Problem Relation Age of Onset  . Hypertension Mother   . Diabetes Mother   . Heart disease Mother   . Hypertension Father   . Diabetes Father   . Heart disease Father   . Stroke Maternal Grandfather   . Other Neg Hx      Social History   Socioeconomic History  . Marital status: Married    Spouse name: Not on file  . Number of children: Not on file  . Years of education: Not on file  . Highest education level: Not on file  Occupational History  . Not on file  Social Needs  . Financial resource strain: Not on file  . Food insecurity    Worry: Not on file    Inability: Not on file  . Transportation needs    Medical: Not on file    Non-medical: Not on file  Tobacco Use  . Smoking status: Current Every Day Smoker    Packs/day: 0.50    Years: 20.00    Pack years: 10.00    Types: Cigarettes  . Smokeless tobacco: Never Used  Substance and Sexual Activity  . Alcohol use: No  . Drug use: No  . Sexual activity: Yes  Birth control/protection: None  Lifestyle  . Physical activity    Days per week: Not on file    Minutes per session: Not on file  . Stress: Not on file  Relationships  . Social Herbalist on phone: Not on file    Gets together: Not on file    Attends religious service: Not on file    Active member of club or organization: Not on file    Attends meetings of clubs or organizations: Not on file    Relationship status: Not on file  . Intimate partner violence    Fear of current or ex partner: Not on file    Emotionally abused: Not on file    Physically abused: Not on file    Forced sexual activity: Not on file  Other Topics Concern  . Not on file  Social History Narrative  . Not on file     Allergies  Allergen Reactions  . Strawberry Extract Hives  . Sulfa Antibiotics Hives and Itching     Outpatient Medications Prior to Visit  Medication Sig  Dispense Refill  . amLODipine (NORVASC) 10 MG tablet Take 1 tablet (10 mg total) by mouth daily. 90 tablet 3  . aspirin EC 81 MG tablet Take 1 tablet (81 mg total) by mouth daily. 60 tablet 3  . atorvastatin (LIPITOR) 40 MG tablet Take 1 tablet (40 mg total) by mouth daily. 90 tablet 3  . blood glucose meter kit and supplies KIT Dispense based on patient and insurance preference. Use up to four times daily as directed. (FOR ICD-9 250.00, 250.01). 1 each 0  . Blood Glucose Monitoring Suppl (TRUE METRIX METER) w/Device KIT 1 each by Does not apply route as needed. 1 kit 0  . glucose blood (TRUE METRIX BLOOD GLUCOSE TEST) test strip 1 each by Other route every morning. 90 each 3  . ibuprofen (ADVIL) 600 MG tablet Take 1 tablet (600 mg total) by mouth every 8 (eight) hours as needed. 60 tablet 0  . Insulin Pen Needle (B-D ULTRAFINE III SHORT PEN) 31G X 8 MM MISC 1 application by Does not apply route daily. 90 each 3  . Lancet Devices (ADJUSTABLE LANCING DEVICE) MISC Inject 1 each into the skin 3 (three) times daily. 100 each 5  . Lancets 30G MISC 1 each by Does not apply route 3 (three) times daily. 100 each 3  . losartan-hydrochlorothiazide (HYZAAR) 100-25 MG tablet Take 1 tablet by mouth daily. 90 tablet 3  . lurasidone (LATUDA) 40 MG TABS tablet Take 40 mg by mouth daily with breakfast.    . Insulin Glargine (BASAGLAR KWIKPEN) 100 UNIT/ML SOPN Inject 0.7 mLs (70 Units total) into the skin daily. 15 mL 2  . nicotine polacrilex (NICOTINE MINI) 4 MG lozenge Take 1 lozenge (4 mg total) by mouth as needed for smoking cessation. (Patient not taking: Reported on 07/10/2019) 100 tablet 0   No facility-administered medications prior to visit.       Review of Systems  Constitutional: Positive for appetite change. Negative for fatigue, fever and unexpected weight change.  HENT: Negative.   Eyes: Negative for visual disturbance.  Respiratory: Negative for apnea, cough, choking, chest tightness, shortness  of breath, wheezing and stridor.   Cardiovascular: Negative for chest pain, palpitations and leg swelling.  Gastrointestinal: Negative.   Genitourinary: Negative.   Musculoskeletal:       R shoulder pain, bilateral foot pain  Skin: Negative.   Neurological: Negative.  Hematological: Negative.   Psychiatric/Behavioral: Positive for dysphoric mood. Negative for self-injury, sleep disturbance and suicidal ideas. The patient is nervous/anxious.        Objective:   Physical Exam Vitals:   07/10/19 1359  BP: 140/83  Pulse: 97  Temp: 98 F (36.7 C)  TempSrc: Oral  SpO2: 99%  Weight: 206 lb 3.2 oz (93.5 kg)  Height: '5\' 4"'$  (1.626 m)   PHQ 9 was 10 and gad 7 was 6 with no evidence of suicidality Gen: Pleasant, well-nourished, in no distress, anxious affect  ENT: No lesions,  mouth clear,  oropharynx clear, no postnasal drip  Neck: No JVD, no TMG, no carotid bruits  Lungs: No use of accessory muscles, no dullness to percussion, clear without rales or rhonchi  Cardiovascular: RRR, heart sounds normal, no murmur or gallops, no peripheral edema  Abdomen: soft and NT, no HSM,  BS normal  Musculoskeletal: No deformities, no cyanosis or clubbing R shoulder with limited ROM,  Pain at the [facility] joint  Neuro: alert, non focal  Skin: Warm, no lesions or rashes CBG (last 3)  513>> 362 after 20 units Novolog sq    Assessment & Plan:  I personally reviewed all images and lab data in the [facility] system as well as any outside material available during this office visit and agree with the  radiology impressions.   Diabetes type 2, uncontrolled ([place]) Type 2 diabetes poorly controlled During this visit we administered 20 units of NovoLog subcu with blood sugar down to 362.  Patient is requested to go home and hydrate and we will increase Lantus to 80 units daily  We will check a basic metabolic panel at this time   return follow-up in 1 month  Smoking Ongoing tobacco use further counseling  given   Luciann was seen today for diabetes.  Diagnoses and all orders for this visit:  Uncontrolled type 2 diabetes mellitus with hyperglycemia ([place]) -     POCT glucose (manual entry) -     Basic metabolic panel; Future -     POCT URINALYSIS DIP (CLINITEK) -     insulin aspart (novoLOG) injection 10 Units -     POCT glucose (manual entry) -     Basic metabolic panel  DM type 2 with diabetic peripheral neuropathy ([facility])  Hypercholesteremia  Smoking  Other orders -     Insulin Glargine (BASAGLAR KWIKPEN) 100 UNIT/ML SOPN; Inject 0.8 mLs (80 Units total) into the skin daily.

## 2019-07-10 NOTE — Assessment & Plan Note (Addendum)
Type 2 diabetes poorly controlled During this visit we administered 20 units of NovoLog subcu with blood sugar down to 362.  Patient is requested to go home and hydrate and we will increase Lantus to 80 units daily  We will check a basic metabolic panel at this time   return follow-up in 1 month

## 2019-07-10 NOTE — Patient Instructions (Signed)
Increase Lantus to 80 units daily  No other medication changes  A metabolic panel will be obtained today  Return follow-up 2 months

## 2019-07-10 NOTE — Assessment & Plan Note (Signed)
Ongoing tobacco use further counseling given

## 2019-07-11 LAB — BASIC METABOLIC PANEL
BUN/Creatinine Ratio: 14 (ref 9–23)
BUN: 13 mg/dL (ref 6–24)
CO2: 21 mmol/L (ref 20–29)
Calcium: 9.7 mg/dL (ref 8.7–10.2)
Chloride: 95 mmol/L — ABNORMAL LOW (ref 96–106)
Creatinine, Ser: 0.91 mg/dL (ref 0.57–1.00)
GFR calc Af Amer: 88 mL/min/{1.73_m2} (ref >59)
GFR calc non Af Amer: 76 mL/min/{1.73_m2} (ref >59)
Glucose: 377 mg/dL — ABNORMAL HIGH (ref 65–99)
Potassium: 4.2 mmol/L (ref 3.5–5.2)
Sodium: 133 mmol/L — ABNORMAL LOW (ref 134–144)

## 2019-07-20 ENCOUNTER — Other Ambulatory Visit: Payer: Self-pay

## 2019-07-20 ENCOUNTER — Ambulatory Visit: Payer: Self-pay | Attending: Family Medicine

## 2019-07-29 NOTE — Progress Notes (Signed)
COVID-19 Screening performed. Temperature, PHQ-9, and need for medical care and medications assessed. No additional needs assessed at this time.  Claryce Friel MSN, RN 

## 2019-08-09 ENCOUNTER — Telehealth: Payer: Self-pay | Admitting: Critical Care Medicine

## 2019-08-09 NOTE — Telephone Encounter (Signed)
Pt was sent a letter from financial dept. Inform them, that the application they submitted was incomplete, since they were missing some documentation at the time of the appointment, Pt need to reschedule and resubmit all new papers and application for CAFA and [place], P.S. old documents has been sent back to Pt and need to make a new appt °

## 2019-08-14 ENCOUNTER — Telehealth: Payer: Self-pay | Admitting: Critical Care Medicine

## 2019-08-14 NOTE — Telephone Encounter (Signed)
I email the Pt since I still waiting for the bank statement  For the last 3 month, to complete her application for financial she was inform that she has until 08/17/19 to submit the documents

## 2019-09-11 ENCOUNTER — Ambulatory Visit: Payer: Medicaid Other | Admitting: Critical Care Medicine

## 2019-09-25 ENCOUNTER — Telehealth: Payer: Self-pay

## 2019-09-25 DIAGNOSIS — E11649 Type 2 diabetes mellitus with hypoglycemia without coma: Secondary | ICD-10-CM

## 2019-09-25 LAB — GLUCOSE, POCT (MANUAL RESULT ENTRY): POC Glucose: 360 mg/dl (ref 70–99)

## 2019-09-25 NOTE — Telephone Encounter (Signed)
Nurse called to verify upcoming appointment at [facility] for client.  Appointment is 10-02-2019 @ 2 pm  Reminder given to client.

## 2019-09-25 NOTE — Congregational Nurse Program (Signed)
Dept: [facility] Nurse Program Note  Date of Encounter: 09/25/2019  Past Medical History: Past Medical History:  Diagnosis Date  . Abnormal Pap smear 1998  . Abnormal vaginal bleeding 12/19/2011  . Bacterial infection   . Bipolar 1 disorder ([place])   . Blister of second toe of left foot 11/21/2016  . Candida vaginitis 07/2007  . Depression    recently added wellbutrin-has not taken yet for bipolar  . Diabetes in pregnancy   . Diabetes mellitus    nph 20U qam and qpm, regular with meals  . Fibroid   . Galactorrhea of right breast 2008  . H/O amenorrhea 07/2007  . H/O dizziness 10/14/11  . H/O dysmenorrhea 2010  . H/O menorrhagia 10/14/11  . H/O varicella   . Headache(784.0)   . Heavy vaginal bleeding due to contraceptive injection use 10/12/2011   Depo Provera  . Herpes   . HSV-2 infection 01/03/2009  . Hx: UTI (urinary tract infection) 2009  . Hypertension    on aldomet  . Increased BMI 2010  . Irregular uterine bleeding 04/04/2012   Pt has mirena   . Obesity 10/14/11  . Oligomenorrhea 07/2007  . Pelvic pain in female   . Preterm labor   . Trichomonas   . Yeast infection     Encounter Details: CNP Questionnaire - 09/25/19 1617      Questionnaire   Patient Status  Not Applicable    Race  Black or African American    Location Patient Served At  Not Applicable    Insurance  Not Applicable    Uninsured  Uninsured (NEW 1x/quarter)    Food  No food insecurities    Housing/Utilities  No permanent housing    Transportation  No transportation needs    Medication  Yes, have medication insecurities    Medical Provider  Yes    Referrals  Primary Care Provider/Clinic;Area Agency;[facility]    ED Visit Averted  Not Applicable    Life-Saving Intervention Made  Not Applicable     Initial visit to see [facility] nurse states she has been at the [facility] for about 3 months now ,working with case management  To get housing and there are some things in the  works . She has been here in [place] for 22 years ,works part time at [employer] and [place] 4 children ,3 with her now ages 42,13,10 and 74 years . Children are okay adjusting to shelter and school situations . States she is keeping up on all of there needs and flu shots. Received her flu shot  today ,card given .Has transportation and her PCP is at [facility] and has an upcoming appointment Nurse called to verify appointment . Time is 10-02-2019 @ 2  pm,reminder given to client. States she is a diabetic on insulin 70 units after dinner and has high blood pressure but has been off her medications for 3-4 weeks now because she was having all kinds of side effects and stressed.  Nurse counseled regarding diabetes and high blood pressure on heart ,kidneys ,eyes and all systems . Blood sugar was high and blood pressure 174/93 pulse 95,blood sugar at 3:16 pm was 360 mg ,had lunch .client was instructed to take her insulin now along with her  blood pressure medication , nurse will retake blood sugar level  before dinner today .Client has a meter but doesn't check her sugar level . Has a stomach rash on left side  of stomah area about  the size of large hand   for 4-5 days now and  seems to be getting worst , area is enlarging she reports  ,itching has increased and it wont allow her to rest    Tried over the counter medications as benadryl and cortisone cream still no relief  ,applied cool moisture compresses relief for short periods of time . Rash red ,some tenderness ,raised areas . Recommended she go to urgent care tomorrow to have it checked out.cautioned to stop scratching with nails to prevent skin infection .Rotates insulin injection sites but has been taking insulin or using that side of her abdomen Client states she isn't sleeping well  ,lots of stress but is connected with [facility] [place], and likes her therapy ,history of bipolar and takes medication sometimes .  Client was pleasant but is not  compliant with medications ,diabetes out of control and blood pressure. Will monitor for the next day ,encourage to get back on her  medications ,see her PCP next week . Has knowledge of what she needs to be doing but just hasn't follow through. Has spoken with nutritionist regarding her diabetes. Nurse to do chart review to get drug list.

## 2019-09-26 ENCOUNTER — Ambulatory Visit (HOSPITAL_COMMUNITY)
Admission: EM | Admit: 2019-09-26 | Discharge: 2019-09-26 | Disposition: A | Discharge status: Home / Self Care | Payer: Medicaid Other | Attending: Family Medicine | Admitting: Family Medicine

## 2019-09-26 ENCOUNTER — Other Ambulatory Visit: Payer: Self-pay

## 2019-09-26 ENCOUNTER — Encounter (HOSPITAL_COMMUNITY): Payer: Self-pay

## 2019-09-26 DIAGNOSIS — R21 Rash and other nonspecific skin eruption: Secondary | ICD-10-CM

## 2019-09-26 DIAGNOSIS — E11649 Type 2 diabetes mellitus with hypoglycemia without coma: Secondary | ICD-10-CM

## 2019-09-26 LAB — GLUCOSE, POCT (MANUAL RESULT ENTRY): POC Glucose: 455 mg/dl (ref 70–99)

## 2019-09-26 MED ORDER — TRIAMCINOLONE ACETONIDE 0.5 % EX OINT: TOPICAL_OINTMENT | CUTANEOUS | 0 refills | Status: DC

## 2019-09-26 MED ORDER — CETIRIZINE HCL 10 MG PO CAPS: 10.0000 mg | ORAL_CAPSULE | Freq: Every day | ORAL | 0 refills | Status: DC

## 2019-09-26 MED FILL — TRIAMCINOLONE 0.5% OINTMENT: 0.5 | 15 days supply | Qty: 30 | Fill #0

## 2019-09-26 MED FILL — ?CETIRIZINE HCL 10 MG TABLE: 10 | 10 days supply | Qty: 10 | Fill #0

## 2019-09-26 NOTE — Discharge Instructions (Signed)
Please use thin amount of Kenalog ointment twice daily to area of itching.  Please also use daily cetirizine to help with itching, may supplement with Benadryl in the evening.  May continue to apply lotion or try Aveeno  Follow-up if rash spreading, worsening, not resolving, developing fevers nausea, vomiting, headaches, lightheadedness, dizziness

## 2019-09-26 NOTE — ED Provider Notes (Signed)
[facility]    CSN: [id number] Arrival date & time: 09/26/19  0915      History   Chief Complaint Chief Complaint  Patient presents with  . Rash    HPI Sarah Long is a 48 y.o. female history of DM type II, hypertension, presenting today for evaluation of a rash.  Patient states that over the past 4 days she has had an area of itching to her left upper abdomen.  She states initially it has felt warm and with significant itchiness.  She has tried applying warm compresses, taking Benadryl and applying OTC cortisone cream.  Symptoms have persisted.  She denies any new exposures, denies new foods, medicines, hygiene products, soaps, lotions, detergents.  Denies any new bras or clothing that could potentially be rubbing in this area.  Denies associated pain.  Denies fevers chills body aches.  Denies headaches.  Denies nausea vomiting, dizziness or lightheadedness.  Denies history of similar.  HPI  Past Medical History:  Diagnosis Date  . Abnormal Pap smear 1998  . Abnormal vaginal bleeding 12/19/2011  . Bacterial infection   . Bipolar 1 disorder ([place])   . Blister of second toe of left foot 11/21/2016  . Candida vaginitis 07/2007  . Depression    recently added wellbutrin-has not taken yet for bipolar  . Diabetes in pregnancy   . Diabetes mellitus    nph 20U qam and qpm, regular with meals  . Fibroid   . Galactorrhea of right breast 2008  . H/O amenorrhea 07/2007  . H/O dizziness 10/14/11  . H/O dysmenorrhea 2010  . H/O menorrhagia 10/14/11  . H/O varicella   . Headache(784.0)   . Heavy vaginal bleeding due to contraceptive injection use 10/12/2011   Depo Provera  . Herpes   . HSV-2 infection 01/03/2009  . Hx: UTI (urinary tract infection) 2009  . Hypertension    on aldomet  . Increased BMI 2010  . Irregular uterine bleeding 04/04/2012   Pt has mirena   . Obesity 10/14/11  . Oligomenorrhea 07/2007  . Pelvic pain in female   . Preterm labor   . Trichomonas   .  Yeast infection     Patient Active Problem List   Diagnosis Date Noted  . DM type 2 with diabetic peripheral neuropathy ([place]) 05/30/2019  . 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 block) 05/29/2019  . Bilateral bunions 05/29/2019  . Chronic right shoulder pain 05/29/2019  . Diabetes type 2, uncontrolled ([place]) 10/21/2016  . Smoking 11/26/2013  . Hypercholesteremia 02/02/2007  . Bipolar 1 disorder ([place]) 02/02/2007  . HYPERTENSION, BENIGN SYSTEMIC 02/02/2007    Past Surgical History:  Procedure Laterality Date  . CESAREAN SECTION  1991    OB History    Gravida  6   Para  4   Term  1   Preterm  3   AB  2   Living  4     SAB  1   TAB  1   Ectopic  0   Multiple  0   Live Births  3            Home Medications    Prior to Admission medications   Medication Sig Start Date End Date Taking? Authorizing Provider  amLODipine (NORVASC) 10 MG tablet Take 1 tablet (10 mg total) by mouth daily. 05/02/19   [name], MD  aspirin EC 81 MG tablet Take 1 tablet (81 mg total) by mouth daily.  05/02/19   [name], MD  atorvastatin (LIPITOR) 40 MG tablet Take 1 tablet (40 mg total) by mouth daily. 05/02/19   [name], MD  blood glucose meter kit and supplies KIT Dispense based on patient and insurance preference. Use up to four times daily as directed. (FOR ICD-9 250.00, 250.01). 11/14/18   Clent Demark, PA-C  Blood Glucose Monitoring Suppl (TRUE METRIX METER) w/Device KIT 1 each by Does not apply route as needed. 10/17/18   Clent Demark, PA-C  Cetirizine HCl 10 MG CAPS Take 1 capsule (10 mg total) by mouth daily for 10 days. 09/26/19 10/06/19  Wieters, Hallie C, PA-C  glucose blood (TRUE METRIX BLOOD GLUCOSE TEST) test strip 1 each by Other route every morning. 05/02/19   [name], MD  ibuprofen (ADVIL) 600 MG tablet Take 1 tablet (600 mg total) by mouth every 8 (eight) hours as needed. 05/29/19   [name],  MD  Insulin Glargine (BASAGLAR KWIKPEN) 100 UNIT/ML SOPN Inject 0.8 mLs (80 Units total) into the skin daily. 07/10/19   [name], MD  Insulin Pen Needle (B-D ULTRAFINE III SHORT PEN) 31G X 8 MM MISC 1 application by Does not apply route daily. 05/02/19   [name], MD  Lancet Devices (ADJUSTABLE LANCING DEVICE) MISC Inject 1 each into the skin 3 (three) times daily. 10/17/18   Clent Demark, PA-C  Lancets 30G MISC 1 each by Does not apply route 3 (three) times daily. 05/02/19   [name], MD  losartan-hydrochlorothiazide (HYZAAR) 100-25 MG tablet Take 1 tablet by mouth daily. 05/29/19   [name], MD  lurasidone (LATUDA) 40 MG TABS tablet Take 40 mg by mouth daily with breakfast.    [provider]  triamcinolone ointment (KENALOG) 0.5 % Apply thin amount twice daily to area of itching 09/26/19   Wieters, [place] C, PA-C    Family History Family History  Problem Relation Age of Onset  . Hypertension Mother   . Diabetes Mother   . Heart disease Mother   . Hypertension Father   . Diabetes Father   . Heart disease Father   . Stroke Maternal Grandfather   . Other Neg Hx     Social History Social History   Tobacco Use  . Smoking status: Current Every Day Smoker    Packs/day: 0.50    Years: 20.00    Pack years: 10.00    Types: Cigarettes  . Smokeless tobacco: Never Used  Substance Use Topics  . Alcohol use: No  . Drug use: No     Allergies   Strawberry extract and Sulfa antibiotics   Review of Systems Review of Systems  Constitutional: Negative for fatigue and fever.  HENT: Negative for mouth sores.   Eyes: Negative for visual disturbance.  Respiratory: Negative for shortness of breath.   Cardiovascular: Negative for chest pain.  Gastrointestinal: Negative for abdominal pain, nausea and vomiting.  Genitourinary: Negative for genital sores.  Musculoskeletal: Negative for arthralgias and joint swelling.  Skin: Positive for color  change and rash. Negative for wound.  Neurological: Negative for dizziness, weakness, light-headedness and headaches.     Physical Exam Triage Vital Signs ED Triage Vitals  Enc Vitals Group     BP 09/26/19 0935 (!) 143/78     Pulse Rate 09/26/19 0935 82     Resp 09/26/19 0935 16     Temp 09/26/19 0935 97.9 F (36.6 C)  Temp Source 09/26/19 0935 Temporal     SpO2 09/26/19 0935 100 %     Weight 09/26/19 0936 220 lb (99.8 kg)     Height --      Head Circumference --      Peak Flow --      Pain Score 09/26/19 0936 5     Pain Loc --      Pain Edu? --      Excl. in [place]? --    No data found.  Updated Vital Signs BP (!) 143/78 (BP Location: Left Arm)   Pulse 82   Temp 97.9 F (36.6 C) (Temporal)   Resp 16   Wt 220 lb (99.8 kg)   LMP 09/03/2019   SpO2 100%   BMI 37.76 kg/m   Visual Acuity Right Eye Distance:   Left Eye Distance:   Bilateral Distance:    Right Eye Near:   Left Eye Near:    Bilateral Near:     Physical Exam Vitals signs and nursing note reviewed.  Constitutional:      Appearance: She is well-developed.     Comments: No acute distress  HENT:     Head: Normocephalic and atraumatic.     Nose: Nose normal.  Eyes:     Conjunctiva/sclera: Conjunctivae normal.  Neck:     Musculoskeletal: Neck supple.  Cardiovascular:     Rate and Rhythm: Normal rate.  Pulmonary:     Effort: Pulmonary effort is normal. No respiratory distress.  Abdominal:     General: There is no distension.     Comments: Soft, nondistended, nontender to light and deep palpation throughout entire abdomen  Musculoskeletal: Normal range of motion.  Skin:    General: Skin is warm and dry.     Comments: Left upper quadrant of abdomen appears hyperpigmented compared to right side of abdomen, striate present in striate do appear erythematous, but no other erythematous rash noted.  Small scab lesion-patient notes this is from itching.  No rash noted to extremities or face   Neurological:     Mental Status: She is alert and oriented to person, place, and time.      UC Treatments / Results  Labs (all labs ordered are listed, but only abnormal results are displayed) Labs Reviewed - No data to display  EKG   Radiology No results found.  Procedures Procedures (including critical care time)  Medications Ordered in UC Medications - No data to display  Initial Impression / Assessment and Plan / UC Course  I have reviewed the triage vital signs and the nursing notes.  Pertinent labs & imaging results that were available during my care of the patient were reviewed by me and considered in my medical decision making (see chart for details).     Unclear cause of rash to left upper quadrant.  Does not seem classic allergic reaction as localized to abdomen.  Not suggestive of cellulitis or other infectious etiology as patient does not have any associated pain.  At this time will treat symptoms and provide stronger topical steroid.  Avoiding oral as patient states her last A1c was 12 and she is trying to get her diabetes under control.  Provided triamcinolone ointment to apply topically, discussed using thin amount to no more than twice a day.  Also continue with antihistamines and continued monitoring.  No systemic symptoms, vital signs stable.  Discussed strict return precautions. Patient verbalized understanding and is agreeable with plan.  Final Clinical Impressions(s) / UC  Diagnoses   Final diagnoses:  Rash and nonspecific skin eruption     Discharge Instructions     Please use thin amount of Kenalog ointment twice daily to area of itching.  Please also use daily cetirizine to help with itching, may supplement with Benadryl in the evening.  May continue to apply lotion or try Aveeno  Follow-up if rash spreading, worsening, not resolving, developing fevers nausea, vomiting, headaches, lightheadedness, dizziness   ED Prescriptions    Medication  Sig Dispense Auth. Provider   Cetirizine HCl 10 MG CAPS Take 1 capsule (10 mg total) by mouth daily for 10 days. 10 capsule Wieters, Hallie C, PA-C   triamcinolone ointment (KENALOG) 0.5 % Apply thin amount twice daily to area of itching 30 g Wieters, [place] C, PA-C     PDMP not reviewed this encounter.   Wieters, [place] C, PA-C 09/26/19 1003

## 2019-09-26 NOTE — [facility] Program (Signed)
Dept: [facility] [employer] Nurse Program Note  Date of Encounter: 09/26/2019  Past Medical History: Past Medical History:  Diagnosis Date  . Abnormal Pap smear 1998  . Abnormal vaginal bleeding 12/19/2011  . Bacterial infection   . Bipolar 1 disorder ([place])   . Blister of second toe of left foot 11/21/2016  . Candida vaginitis 07/2007  . Depression    recently added wellbutrin-has not taken yet for bipolar  . Diabetes in pregnancy   . Diabetes mellitus    nph 20U qam and qpm, regular with meals  . Fibroid   . Galactorrhea of right breast 2008  . H/O amenorrhea 07/2007  . H/O dizziness 10/14/11  . H/O dysmenorrhea 2010  . H/O menorrhagia 10/14/11  . H/O varicella   . Headache(784.0)   . Heavy vaginal bleeding due to contraceptive injection use 10/12/2011   Depo Provera  . Herpes   . HSV-2 infection 01/03/2009  . Hx: UTI (urinary tract infection) 2009  . Hypertension    on aldomet  . Increased BMI 2010  . Irregular uterine bleeding 04/04/2012   Pt has mirena   . Obesity 10/14/11  . Oligomenorrhea 07/2007  . Pelvic pain in female   . Preterm labor   . Trichomonas   . Yeast infection     Encounter Details: CNP Questionnaire - 09/26/19 1748      Questionnaire   Patient Status  Not Applicable    Race  Black or African American    Location Patient Served At  Not Applicable    Insurance  Not Applicable    Uninsured  Uninsured (Subsequent visits/quarter)    Food  No food insecurities    Housing/Utilities  No permanent housing    Transportation  No transportation needs    Interpersonal Safety  Yes, feel physically and emotionally safe where you currently live    Medication  Yes, have medication insecurities    Medical Provider  Yes    Referrals  Primary Care Provider/Clinic;Area Agency;[facility]    ED Visit Averted  Not Applicable    Life-Saving Intervention Made  Not Applicable     Client states she picked up the children yesterday and had  several errands to run and never made it back to recheck her blood sugar but did start back taking her insulin and took 70 units . Feels okay today ,seen at urgent care for rash given medications to ease itching ,feels that will help. Also took her blood pressure medications yesterday . Blood sugar up higher than yesterday but states she really ate a large lunch and drunk strawberry soda . States she is very thirsty ,nurse explained why. Client was ask to go ahead and take her insulin for today at  $ pm today because she has been off her insulin for weeks now it will take several days to get it back down and she has an appointment to see her PCP on Tuesday . Blood sugar was 455 mg ,counseled regarding nutrition and ask to drink more water ,stop sodas and continues to smoke ,states she is too stressed right now to quit. Our goal will be to decrease smoking at least and make better nutritional choices . Blood sugar uncontrolled at present  Back on insulin. 455 mg at 3:57 pm . Took insulin  80 units as record indicates reviewed with client that in August she was to do 80 units .  5;25 pm  Blood sugar down to 367 mg ,will make  better food choices for dinner .  Monitor blood pressure and blood sugars ,gave client information on A1-C ,high and low blood sugars and nutritional sheet on eat more eat less  Follow next week after PCP visit

## 2019-09-26 NOTE — ED Triage Notes (Signed)
Pt states she has a rash on her abdominal area x 3 days.

## 2019-10-02 ENCOUNTER — Ambulatory Visit: Payer: Self-pay | Attending: Critical Care Medicine | Admitting: Critical Care Medicine

## 2019-10-02 ENCOUNTER — Encounter: Payer: Self-pay | Admitting: Critical Care Medicine

## 2019-10-02 ENCOUNTER — Other Ambulatory Visit: Payer: Self-pay

## 2019-10-02 VITALS — BP 187/112 | Ht 64.0 in | Wt 209.8 lb

## 2019-10-02 DIAGNOSIS — E7800 Pure hypercholesterolemia, unspecified: Secondary | ICD-10-CM

## 2019-10-02 DIAGNOSIS — E1165 Type 2 diabetes mellitus with hyperglycemia: Secondary | ICD-10-CM

## 2019-10-02 DIAGNOSIS — I10 Essential (primary) hypertension: Secondary | ICD-10-CM

## 2019-10-02 DIAGNOSIS — F17200 Nicotine dependence, unspecified, uncomplicated: Secondary | ICD-10-CM

## 2019-10-02 DIAGNOSIS — F17210 Nicotine dependence, cigarettes, uncomplicated: Secondary | ICD-10-CM

## 2019-10-02 DIAGNOSIS — E1142 Type 2 diabetes mellitus with diabetic polyneuropathy: Secondary | ICD-10-CM

## 2019-10-02 DIAGNOSIS — F319 Bipolar disorder, unspecified: Secondary | ICD-10-CM

## 2019-10-02 DIAGNOSIS — N946 Dysmenorrhea, unspecified: Secondary | ICD-10-CM | POA: Insufficient documentation

## 2019-10-02 LAB — GLUCOSE, POCT (MANUAL RESULT ENTRY)
POC Glucose: 419 mg/dl — AB (ref 70–99)
POC Glucose: 501 mg/dl — AB (ref 70–99)

## 2019-10-02 MED ORDER — CLONIDINE HCL 0.1 MG PO TABS: 0.1000 mg | ORAL_TABLET | Freq: Two times a day (BID) | ORAL | 3 refills | Status: DC

## 2019-10-02 MED ORDER — INSULIN ASPART 100 UNIT/ML ~~LOC~~ SOLN: 20.0000 [IU] | Freq: Once | SUBCUTANEOUS | Status: DC

## 2019-10-02 MED ORDER — LOSARTAN POTASSIUM-HCTZ 100-25 MG PO TABS: 1.0000 | ORAL_TABLET | Freq: Every day | ORAL | 3 refills | Status: DC

## 2019-10-02 MED ORDER — ATORVASTATIN CALCIUM 40 MG PO TABS: 40.0000 mg | ORAL_TABLET | Freq: Every day | ORAL | 3 refills | Status: DC

## 2019-10-02 MED ORDER — MELOXICAM 7.5 MG PO TABS: 7.5000 mg | ORAL_TABLET | Freq: Every day | ORAL | 3 refills | Status: DC

## 2019-10-02 MED ORDER — AMLODIPINE BESYLATE 10 MG PO TABS: 10.0000 mg | ORAL_TABLET | Freq: Every day | ORAL | 3 refills | Status: DC

## 2019-10-02 MED ORDER — NOVOLOG FLEXPEN 100 UNIT/ML ~~LOC~~ SOPN: 8.0000 [IU] | PEN_INJECTOR | Freq: Three times a day (TID) | SUBCUTANEOUS | 11 refills | Status: DC

## 2019-10-02 MED FILL — LOSARTAN-HCTZ 100-25 MG TAB: 100-25 | 30 days supply | Qty: 30 | Fill #0

## 2019-10-02 MED FILL — ATORVASTATIN CALCIUM 40 MG: 40 | 30 days supply | Qty: 30 | Fill #0

## 2019-10-02 MED FILL — MELOXICAM 7.5 MG TABLET: 7.5 | 30 days supply | Qty: 30 | Fill #0

## 2019-10-02 MED FILL — !NOVOLOG FLEXPEN SYRINGE 1: 100/ML | 12 days supply | Qty: 3 | Fill #0

## 2019-10-02 MED FILL — AMLODIPINE BESYLATE 10 MG T: 10 | 30 days supply | Qty: 30 | Fill #0

## 2019-10-02 MED FILL — cloNIDine HCL 0.1 MG TABS: 0.1 | 30 days supply | Qty: 60 | Fill #0

## 2019-10-02 NOTE — Assessment & Plan Note (Signed)
Severe dysmenorrhea in this patient who has seen gynecology  She may need birth control hormonal medication and for now we will switch ibuprofen to meloxicam 7.5 mg daily

## 2019-10-02 NOTE — Patient Instructions (Addendum)
Begin clonidine 0.1 mg twice daily for blood pressure  Continue amlodipine and losartan HCTZ daily as prescribed  Begin NovoLog FlexPen 8 units 3 times daily at meals and continue Lantus 80 units daily  An appointment with our clinical pharmacist Lurena Joiner will be made and he checked in with you today  Begin meloxicam 7.5 mg daily during painful periods and follow-up with your gynecologist  Return in follow-up with Dr. Joya Gaskins in 6 weeks  Continue to work on smoking cessation  Follow diabetic diet as below   Diabetes Mellitus and Nutrition, Adult When you have diabetes (diabetes mellitus), it is very important to have healthy eating habits because your blood sugar (glucose) levels are greatly affected by what you eat and drink. Eating healthy foods in the appropriate amounts, at about the same times every day, can help you:  Control your blood glucose.  Lower your risk of heart disease.  Improve your blood pressure.  Reach or maintain a healthy weight. Every person with diabetes is different, and each person has different needs for a meal plan. Your health care provider may recommend that you work with a diet and nutrition specialist (dietitian) to make a meal plan that is best for you. Your meal plan may vary depending on factors such as:  The calories you need.  The medicines you take.  Your weight.  Your blood glucose, blood pressure, and cholesterol levels.  Your activity level.  Other health conditions you have, such as heart or kidney disease. How do carbohydrates affect me? Carbohydrates, also called carbs, affect your blood glucose level more than any other type of food. Eating carbs naturally raises the amount of glucose in your blood. Carb counting is a method for keeping track of how many carbs you eat. Counting carbs is important to keep your blood glucose at a healthy level, especially if you use insulin or take certain oral diabetes medicines. It is important to know  how many carbs you can safely have in each meal. This is different for every person. Your dietitian can help you calculate how many carbs you should have at each meal and for each snack. Foods that contain carbs include:  Bread, cereal, rice, pasta, and crackers.  Potatoes and corn.  Peas, beans, and lentils.  Milk and yogurt.  Fruit and juice.  Desserts, such as cakes, cookies, ice cream, and candy. How does alcohol affect me? Alcohol can cause a sudden decrease in blood glucose (hypoglycemia), especially if you use insulin or take certain oral diabetes medicines. Hypoglycemia can be a life-threatening condition. Symptoms of hypoglycemia (sleepiness, dizziness, and confusion) are similar to symptoms of having too much alcohol. If your health care provider says that alcohol is safe for you, follow these guidelines:  Limit alcohol intake to no more than 1 drink per day for nonpregnant women and 2 drinks per day for men. One drink equals 12 oz of beer, 5 oz of wine, or 1 oz of hard liquor.  Do not drink on an empty stomach.  Keep yourself hydrated with water, diet soda, or unsweetened iced tea.  Keep in mind that regular soda, juice, and other mixers may contain a lot of sugar and must be counted as carbs. What are tips for following this plan?  Reading food labels  Start by checking the serving size on the "Nutrition Facts" label of packaged foods and drinks. The amount of calories, carbs, fats, and other nutrients listed on the label is based on one serving of  the item. Many items contain more than one serving per package.  Check the total grams (g) of carbs in one serving. You can calculate the number of servings of carbs in one serving by dividing the total carbs by 15. For example, if a food has 30 g of total carbs, it would be equal to 2 servings of carbs.  Check the number of grams (g) of saturated and trans fats in one serving. Choose foods that have low or no amount of these  fats.  Check the number of milligrams (mg) of salt (sodium) in one serving. Most people should limit total sodium intake to less than 2,300 mg per day.  Always check the nutrition information of foods labeled as "low-fat" or "nonfat". These foods may be higher in added sugar or refined carbs and should be avoided.  Talk to your dietitian to identify your daily goals for nutrients listed on the label. Shopping  Avoid buying canned, premade, or processed foods. These foods tend to be high in fat, sodium, and added sugar.  Shop around the outside edge of the grocery store. This includes fresh fruits and vegetables, bulk grains, fresh meats, and fresh dairy. Cooking  Use low-heat cooking methods, such as baking, instead of high-heat cooking methods like deep frying.  Cook using healthy oils, such as olive, canola, or sunflower oil.  Avoid cooking with butter, cream, or high-fat meats. Meal planning  Eat meals and snacks regularly, preferably at the same times every day. Avoid going long periods of time without eating.  Eat foods high in fiber, such as fresh fruits, vegetables, beans, and whole grains. Talk to your dietitian about how many servings of carbs you can eat at each meal.  Eat 4-6 ounces (oz) of lean protein each day, such as lean meat, chicken, fish, eggs, or tofu. One oz of lean protein is equal to: ? 1 oz of meat, chicken, or fish. ? 1 egg. ?  cup of tofu.  Eat some foods each day that contain healthy fats, such as avocado, nuts, seeds, and fish. Lifestyle  Check your blood glucose regularly.  Exercise regularly as told by your health care provider. This may include: ? 150 minutes of moderate-intensity or vigorous-intensity exercise each week. This could be brisk walking, biking, or water aerobics. ? Stretching and doing strength exercises, such as yoga or weightlifting, at least 2 times a week.  Take medicines as told by your health care provider.  Do not use any  products that contain nicotine or tobacco, such as cigarettes and e-cigarettes. If you need help quitting, ask your health care provider.  Work with a Social worker or diabetes educator to identify strategies to manage stress and any emotional and social challenges. Questions to ask a health care provider  Do I need to meet with a diabetes educator?  Do I need to meet with a dietitian?  What number can I call if I have questions?  When are the best times to check my blood glucose? Where to find more information:  American Diabetes Association: diabetes.org  Academy of Nutrition and Dietetics: www.eatright.[employer] of Diabetes and Digestive and Kidney Diseases (NIH): [employer] Summary  A healthy meal plan will help you control your blood glucose and maintain a healthy lifestyle.  Working with a diet and nutrition specialist (dietitian) can help you make a meal plan that is best for you.  Keep in mind that carbohydrates (carbs) and alcohol have immediate effects on your blood glucose levels.  It is important to count carbs and to use alcohol carefully. This information is not intended to replace advice given to you by your health care provider. Make sure you discuss any questions you have with your health care provider. Document Released: 08/19/2005 Document Revised: 11/04/2017 Document Reviewed: 12/27/2016 Elsevier Patient Education  2020 [employer].

## 2019-10-02 NOTE — Assessment & Plan Note (Signed)
Type 2 diabetes very poorly controlled secondary to dietary noncompliance Plan will be to begin NovoLog 8 units 3 times daily at meals continue Lantus 80 units daily and have asked our clinical pharmacist to have an appoint with this patient within a month he did meet the patient today in the office.  I also went over diabetic diet with the patient

## 2019-10-02 NOTE — Assessment & Plan Note (Signed)
Continue to provide smoking cessation counseling of 5 minutes duration during this visit

## 2019-10-02 NOTE — Assessment & Plan Note (Signed)
The patient appears to be stable at this time on Latuda per psychiatry

## 2019-10-02 NOTE — Assessment & Plan Note (Signed)
Hypertension poorly controlled at this time plan will be to add clonidine 0.1 mg twice daily and continue losartan HCT 100/25 daily and amlodipine at 10 mg daily

## 2019-10-02 NOTE — Progress Notes (Signed)
Subjective:    Patient ID: Sarah Long, female    DOB: [birth year], 48 y.o.   MRN: [id number]  This is a 48 year old female history of type 2 diabetes and bipolar disorder.  Patient was last seen in June and was found to have hypertension not well controlled, a holosystolic murmur which was found to be physiologic with normal echocardiogram, type 2 diabetes poorly controlled, bipolar disorder, homelessness, and ongoing tobacco use.  The patient now is moved back to the homeless shelter away from the hotel she was housed in over the pandemic.  Note today on arrival blood sugar was greater than 500 she states she has not taken her insulin recently.  The patient has been compliant with her blood pressure medications however is still smoking a pack a day of cigarettes.  Patient has been compliant with her Sarah Long that she receives from family services of the [place].  The patient does have bunions in the feet but is yet to have her appointment with podiatry  10/02/2019 Patient is seen in follow-up from an August visit for poorly controlled type 2 diabetes, tobacco use, hyperlipidemia.  The patient lives in a shelter at [facility] and does work Building surveyor.  The patient often forgets to take her blood pressure medicine but she is taking her insulin daily.  Unfortunate her diet has been very noncompliant.  She eats lots of fast food and drinks sodas that contain sugar and sugar Gatorade.  She apparently had a very carbohydrate filled lunch along with sugar Sprite and sugar Gatorade and comes in with a CBG of 501 at this visit.  The patient is maintaining Lantus 80 units daily note she is already had her flu vaccine  She has improvement in her anxiety and depression.  She does complain of significant menstrual cramps that come on with her periods  The patient has tried ibuprofen 6 mg dose without relief.    Past Medical History:  Diagnosis Date  . Abnormal Pap smear 1998  . Abnormal  vaginal bleeding 12/19/2011  . Bacterial infection   . Bipolar 1 disorder ([place])   . Blister of second toe of left foot 11/21/2016  . Candida vaginitis 07/2007  . Depression    recently added wellbutrin-has not taken yet for bipolar  . Diabetes in pregnancy   . Diabetes mellitus    nph 20U qam and qpm, regular with meals  . Fibroid   . Galactorrhea of right breast 2008  . H/O amenorrhea 07/2007  . H/O dizziness 10/14/11  . H/O dysmenorrhea 2010  . H/O menorrhagia 10/14/11  . H/O varicella   . Headache(784.0)   . Heavy vaginal bleeding due to contraceptive injection use 10/12/2011   Depo Provera  . Herpes   . HSV-2 infection 01/03/2009  . Hx: UTI (urinary tract infection) 2009  . Hypertension    on aldomet  . Increased BMI 2010  . Irregular uterine bleeding 04/04/2012   Pt has mirena   . Obesity 10/14/11  . Oligomenorrhea 07/2007  . Pelvic pain in female   . Preterm labor   . Trichomonas   . Yeast infection      Family History  Problem Relation Age of Onset  . Hypertension Mother   . Diabetes Mother   . Heart disease Mother   . Hypertension Father   . Diabetes Father   . Heart disease Father   . Stroke Maternal Grandfather   . Other Neg Hx      Social  History   Socioeconomic History  . Marital status: Married    Spouse name: Not on file  . Number of children: Not on file  . Years of education: Not on file  . Highest education level: Not on file  Occupational History  . Not on file  Social Needs  . Financial resource strain: Not on file  . Food insecurity    Worry: Not on file    Inability: Not on file  . Transportation needs    Medical: Not on file    Non-medical: Not on file  Tobacco Use  . Smoking status: Current Every Day Smoker    Packs/day: 0.50    Years: 20.00    Pack years: 10.00    Types: Cigarettes  . Smokeless tobacco: Never Used  Substance and Sexual Activity  . Alcohol use: No  . Drug use: No  . Sexual activity: Yes    Birth  control/protection: None  Lifestyle  . Physical activity    Days per week: Not on file    Minutes per session: Not on file  . Stress: Not on file  Relationships  . Social Herbalist on phone: Not on file    Gets together: Not on file    Attends religious service: Not on file    Active member of club or organization: Not on file    Attends meetings of clubs or organizations: Not on file    Relationship status: Not on file  . Intimate partner violence    Fear of current or ex partner: Not on file    Emotionally abused: Not on file    Physically abused: Not on file    Forced sexual activity: Not on file  Other Topics Concern  . Not on file  Social History Narrative  . Not on file     Allergies  Allergen Reactions  . Strawberry Extract Hives  . Sulfa Antibiotics Hives and Itching     Outpatient Medications Prior to Visit  Medication Sig Dispense Refill  . aspirin EC 81 MG tablet Take 1 tablet (81 mg total) by mouth daily. 60 tablet 3  . blood glucose meter kit and supplies KIT Dispense based on patient and insurance preference. Use up to four times daily as directed. (FOR ICD-9 250.00, 250.01). 1 each 0  . Blood Glucose Monitoring Suppl (TRUE METRIX METER) w/Device KIT 1 each by Does not apply route as needed. 1 kit 0  . Cetirizine HCl 10 MG CAPS Take 1 capsule (10 mg total) by mouth daily for 10 days. 10 capsule 0  . glucose blood (TRUE METRIX BLOOD GLUCOSE TEST) test strip 1 each by Other route every morning. 90 each 3  . Insulin Glargine (BASAGLAR KWIKPEN) 100 UNIT/ML SOPN Inject 0.8 mLs (80 Units total) into the skin daily. 15 mL 2  . Insulin Pen Needle (B-D ULTRAFINE III SHORT PEN) 31G X 8 MM MISC 1 application by Does not apply route daily. 90 each 3  . Lancet Devices (ADJUSTABLE LANCING DEVICE) MISC Inject 1 each into the skin 3 (three) times daily. 100 each 5  . Lancets 30G MISC 1 each by Does not apply route 3 (three) times daily. 100 each 3  . lurasidone  (LATUDA) 40 MG TABS tablet Take 40 mg by mouth daily with breakfast.    . triamcinolone ointment (KENALOG) 0.5 % Apply thin amount twice daily to area of itching 30 g 0  . amLODipine (NORVASC) 10 MG tablet Take  1 tablet (10 mg total) by mouth daily. 90 tablet 3  . atorvastatin (LIPITOR) 40 MG tablet Take 1 tablet (40 mg total) by mouth daily. 90 tablet 3  . ibuprofen (ADVIL) 600 MG tablet Take 1 tablet (600 mg total) by mouth every 8 (eight) hours as needed. 60 tablet 0  . losartan-hydrochlorothiazide (HYZAAR) 100-25 MG tablet Take 1 tablet by mouth daily. 90 tablet 3   No facility-administered medications prior to visit.       Review of Systems  Constitutional: Positive for appetite change. Negative for fatigue, fever and unexpected weight change.  HENT: Negative.   Eyes: Negative for visual disturbance.  Respiratory: Negative for apnea, cough, choking, chest tightness, shortness of breath, wheezing and stridor.   Cardiovascular: Negative for chest pain, palpitations and leg swelling.  Gastrointestinal: Negative.   Genitourinary: Negative.   Musculoskeletal:       R shoulder pain, bilateral foot pain  Skin: Negative.   Neurological: Negative.   Hematological: Negative.   Psychiatric/Behavioral: Positive for dysphoric mood. Negative for self-injury, sleep disturbance and suicidal ideas. The patient is nervous/anxious.        Objective:   Physical Exam Vitals:   10/02/19 1421  BP: (!) 187/112  SpO2: 100%  Weight: 209 lb 12.8 oz (95.2 kg)  Height: '5\' 4"'$  (1.626 m)    Gen: Pleasant, well-nourished, in no distress, anxious affect  ENT: No lesions,  mouth clear,  oropharynx clear, no postnasal drip  Neck: No JVD, no TMG, no carotid bruits  Lungs: No use of accessory muscles, no dullness to percussion, clear without rales or rhonchi  Cardiovascular: RRR, heart sounds normal, no murmur or gallops, no peripheral edema  Abdomen: soft and NT, no HSM,  BS normal   Musculoskeletal: No deformities, no cyanosis or clubbing  Neuro: alert, non focal  Skin: Warm, no lesions or rashes CBG (last 3)  501> 412 after 20 units Novolog sq    Assessment & Plan:  I personally reviewed all images and lab data in the [facility] system as well as any outside material available during this office visit and agree with the  radiology impressions.   HYPERTENSION, BENIGN SYSTEMIC Hypertension poorly controlled at this time plan will be to add clonidine 0.1 mg twice daily and continue losartan HCT 100/25 daily and amlodipine at 10 mg daily  Diabetes type 2, uncontrolled (HCC) Type 2 diabetes very poorly controlled secondary to dietary noncompliance Plan will be to begin NovoLog 8 units 3 times daily at meals continue Lantus 80 units daily and have asked our clinical pharmacist to have an appoint with this patient within a month he did meet the patient today in the office.  I also went over diabetic diet with the patient  Dysmenorrhea Severe dysmenorrhea in this patient who has seen gynecology  She may need birth control hormonal medication and for now we will switch ibuprofen to meloxicam 7.5 mg daily  Bipolar 1 disorder ([place]) The patient appears to be stable at this time on Latuda per psychiatry  Smoking Continue to provide smoking cessation counseling of 5 minutes duration during this visit   Diagnoses and all orders for this visit:  Uncontrolled type 2 diabetes mellitus with hyperglycemia (HCC) -     Glucose (CBG) -     Glucose (CBG) -     insulin aspart (novoLOG) injection 20 Units  DM type 2 with diabetic peripheral neuropathy (HCC) -     Glucose (CBG) -     Glucose (CBG)  HYPERTENSION, BENIGN SYSTEMIC  Hypertension, unspecified type -     amLODipine (NORVASC) 10 MG tablet; Take 1 tablet (10 mg total) by mouth daily.  Pure hypercholesterolemia -     atorvastatin (LIPITOR) 40 MG tablet; Take 1 tablet (40 mg total) by mouth daily.  Dysmenorrhea   Bipolar 1 disorder (HCC)  Smoking  Other orders -     cloNIDine (CATAPRES) 0.1 MG tablet; Take 1 tablet (0.1 mg total) by mouth 2 (two) times daily. -     losartan-hydrochlorothiazide (HYZAAR) 100-25 MG tablet; Take 1 tablet by mouth daily. -     insulin aspart (NOVOLOG FLEXPEN) 100 UNIT/ML FlexPen; Inject 8 Units into the skin 3 (three) times daily with meals. -     meloxicam (MOBIC) 7.5 MG tablet; Take 1 tablet (7.5 mg total) by mouth daily. During painful menses

## 2019-10-14 ENCOUNTER — Other Ambulatory Visit: Payer: Self-pay

## 2019-10-14 DIAGNOSIS — Z20822 Contact with and (suspected) exposure to covid-19: Secondary | ICD-10-CM

## 2019-10-15 LAB — NOVEL CORONAVIRUS, NAA: SARS-CoV-2, NAA: NOT DETECTED

## 2019-10-16 ENCOUNTER — Ambulatory Visit: Payer: Medicaid Other | Admitting: Pharmacist

## 2019-10-18 ENCOUNTER — Ambulatory Visit: Payer: Medicaid Other | Admitting: Pharmacist

## 2019-10-22 ENCOUNTER — Ambulatory Visit: Payer: Medicaid Other

## 2019-11-09 MED FILL — hydrOXYzine HCL 25 MG TABS: 25 | 30 days supply | Qty: 60 | Fill #0

## 2019-11-12 NOTE — Progress Notes (Deleted)
 Subjective:    Patient ID: Sarah Long, female    DOB: 07/14/1971, 48 y.o.   MRN: [id number]  This is a 48-year-old female history of type 2 diabetes and bipolar disorder.  Patient was last seen in June and was found to have hypertension not well controlled, a holosystolic murmur which was found to be physiologic with normal echocardiogram, type 2 diabetes poorly controlled, bipolar disorder, homelessness, and ongoing tobacco use.  The patient now is moved back to the homeless shelter away from the hotel she was housed in over the pandemic.  Note today on arrival blood sugar was greater than 500 she states she has not taken her insulin recently.  The patient has been compliant with her blood pressure medications however is still smoking a pack a day of cigarettes.  Patient has been compliant with her Latuda that she receives from family services of the [place].  The patient does have bunions in the feet but is yet to have her appointment with podiatry  10/02/2019 Patient is seen in follow-up from an August visit for poorly controlled type 2 diabetes, tobacco use, hyperlipidemia.  The patient lives in a shelter at [facility] and does work cleaning stores.  The patient often forgets to take her blood pressure medicine but she is taking her insulin daily.  Unfortunate her diet has been very noncompliant.  She eats lots of fast food and drinks sodas that contain sugar and sugar Gatorade.  She apparently had a very carbohydrate filled lunch along with sugar Sprite and sugar Gatorade and comes in with a CBG of 501 at this visit.  The patient is maintaining Lantus 80 units daily note she is already had her flu vaccine  She has improvement in her anxiety and depression.  She does complain of significant menstrual cramps that come on with her periods  The patient has tried ibuprofen 6 mg dose without relief.  11/12/2019  HYPERTENSION, BENIGN SYSTEMIC Hypertension poorly controlled at this  time plan will be to add clonidine 0.1 mg twice daily and continue losartan HCT 100/25 daily and amlodipine at 10 mg daily  Diabetes type 2, uncontrolled (HCC) Type 2 diabetes very poorly controlled secondary to dietary noncompliance Plan will be to begin NovoLog 8 units 3 times daily at meals continue Lantus 80 units daily and have asked our clinical pharmacist to have an appoint with this patient within a month he did meet the patient today in the office.  I also went over diabetic diet with the patient  Dysmenorrhea Severe dysmenorrhea in this patient who has seen gynecology  She may need birth control hormonal medication and for now we will switch ibuprofen to meloxicam 7.5 mg daily  Bipolar 1 disorder (HCC) The patient appears to be stable at this time on Latuda per psychiatry  Smoking Continue to provide smoking cessation counseling of 5 minutes duration during this visit   Diagnoses and all orders for this visit:  Uncontrolled type 2 diabetes mellitus with hyperglycemia (HCC) -     Glucose (CBG) -     Glucose (CBG) -     insulin aspart (novoLOG) injection 20 Units  DM type 2 with diabetic peripheral neuropathy (HCC) -     Glucose (CBG) -     Glucose (CBG)  HYPERTENSION, BENIGN SYSTEMIC  Hypertension, unspecified type -     amLODipine (NORVASC) 10 MG tablet; Take 1 tablet (10 mg total) by mouth daily.  Pure hypercholesterolemia -     atorvastatin (LIPITOR)   40 MG tablet; Take 1 tablet (40 mg total) by mouth daily.  Dysmenorrhea  Bipolar 1 disorder (HCC)  Smoking  Other orders -     cloNIDine (CATAPRES) 0.1 MG tablet; Take 1 tablet (0.1 mg total) by mouth 2 (two) times daily. -     losartan-hydrochlorothiazide (HYZAAR) 100-25 MG tablet; Take 1 tablet by mouth daily. -     insulin aspart (NOVOLOG FLEXPEN) 100 UNIT/ML FlexPen; Inject 8 Units into the skin 3 (three) times daily with meals. -     meloxicam (MOBIC) 7.5 MG tablet; Take 1 tablet (7.5 mg total) by mouth  daily. During painful menses     Past Medical History:  Diagnosis Date  . Abnormal Pap smear 1998  . Abnormal vaginal bleeding 12/19/2011  . Bacterial infection   . Bipolar 1 disorder (HCC)   . Blister of second toe of left foot 11/21/2016  . Candida vaginitis 07/2007  . Depression    recently added wellbutrin-has not taken yet for bipolar  . Diabetes in pregnancy   . Diabetes mellitus    nph 20U qam and qpm, regular with meals  . Fibroid   . Galactorrhea of right breast 2008  . H/O amenorrhea 07/2007  . H/O dizziness 10/14/11  . H/O dysmenorrhea 2010  . H/O menorrhagia 10/14/11  . H/O varicella   . Headache(784.0)   . Heavy vaginal bleeding due to contraceptive injection use 10/12/2011   Depo Provera  . Herpes   . HSV-2 infection 01/03/2009  . Hx: UTI (urinary tract infection) 2009  . Hypertension    on aldomet  . Increased BMI 2010  . Irregular uterine bleeding 04/04/2012   Pt has mirena   . Obesity 10/14/11  . Oligomenorrhea 07/2007  . Pelvic pain in female   . Preterm labor   . Trichomonas   . Yeast infection      Family History  Problem Relation Age of Onset  . Hypertension Mother   . Diabetes Mother   . Heart disease Mother   . Hypertension Father   . Diabetes Father   . Heart disease Father   . Stroke Maternal Grandfather   . Other Neg Hx      Social History   Socioeconomic History  . Marital status: Married    Spouse name: Not on file  . Number of children: Not on file  . Years of education: Not on file  . Highest education level: Not on file  Occupational History  . Not on file  Social Needs  . Financial resource strain: Not on file  . Food insecurity    Worry: Not on file    Inability: Not on file  . Transportation needs    Medical: Not on file    Non-medical: Not on file  Tobacco Use  . Smoking status: Current Every Day Smoker    Packs/day: 0.50    Years: 20.00    Pack years: 10.00    Types: Cigarettes  . Smokeless tobacco: Never Used   Substance and Sexual Activity  . Alcohol use: No  . Drug use: No  . Sexual activity: Yes    Birth control/protection: None  Lifestyle  . Physical activity    Days per week: Not on file    Minutes per session: Not on file  . Stress: Not on file  Relationships  . Social connections    Talks on phone: Not on file    Gets together: Not on file    Attends religious   service: Not on file    Active member of club or organization: Not on file    Attends meetings of clubs or organizations: Not on file    Relationship status: Not on file  . Intimate partner violence    Fear of current or ex partner: Not on file    Emotionally abused: Not on file    Physically abused: Not on file    Forced sexual activity: Not on file  Other Topics Concern  . Not on file  Social History Narrative  . Not on file     Allergies  Allergen Reactions  . Strawberry Extract Hives  . Sulfa Antibiotics Hives and Itching     Outpatient Medications Prior to Visit  Medication Sig Dispense Refill  . amLODipine (NORVASC) 10 MG tablet Take 1 tablet (10 mg total) by mouth daily. 90 tablet 3  . aspirin EC 81 MG tablet Take 1 tablet (81 mg total) by mouth daily. 60 tablet 3  . atorvastatin (LIPITOR) 40 MG tablet Take 1 tablet (40 mg total) by mouth daily. 90 tablet 3  . blood glucose meter kit and supplies KIT Dispense based on patient and insurance preference. Use up to four times daily as directed. (FOR ICD-9 250.00, 250.01). 1 each 0  . Blood Glucose Monitoring Suppl (TRUE METRIX METER) w/Device KIT 1 each by Does not apply route as needed. 1 kit 0  . Cetirizine HCl 10 MG CAPS Take 1 capsule (10 mg total) by mouth daily for 10 days. 10 capsule 0  . cloNIDine (CATAPRES) 0.1 MG tablet Take 1 tablet (0.1 mg total) by mouth 2 (two) times daily. 60 tablet 3  . glucose blood (TRUE METRIX BLOOD GLUCOSE TEST) test strip 1 each by Other route every morning. 90 each 3  . insulin aspart (NOVOLOG FLEXPEN) 100 UNIT/ML FlexPen  Inject 8 Units into the skin 3 (three) times daily with meals. 15 mL 11  . Insulin Glargine (BASAGLAR KWIKPEN) 100 UNIT/ML SOPN Inject 0.8 mLs (80 Units total) into the skin daily. 15 mL 2  . Insulin Pen Needle (B-D ULTRAFINE III SHORT PEN) 31G X 8 MM MISC 1 application by Does not apply route daily. 90 each 3  . Lancet Devices (ADJUSTABLE LANCING DEVICE) MISC Inject 1 each into the skin 3 (three) times daily. 100 each 5  . Lancets 30G MISC 1 each by Does not apply route 3 (three) times daily. 100 each 3  . losartan-hydrochlorothiazide (HYZAAR) 100-25 MG tablet Take 1 tablet by mouth daily. 90 tablet 3  . lurasidone (LATUDA) 40 MG TABS tablet Take 40 mg by mouth daily with breakfast.    . meloxicam (MOBIC) 7.5 MG tablet Take 1 tablet (7.5 mg total) by mouth daily. During painful menses 30 tablet 3  . triamcinolone ointment (KENALOG) 0.5 % Apply thin amount twice daily to area of itching 30 g 0   Facility-Administered Medications Prior to Visit  Medication Dose Route Frequency Provider Last Rate Last Dose  . insulin aspart (novoLOG) injection 20 Units  20 Units Subcutaneous Once [name], MD          Review of Systems  Constitutional: Positive for appetite change. Negative for fatigue, fever and unexpected weight change.  HENT: Negative.   Eyes: Negative for visual disturbance.  Respiratory: Negative for apnea, cough, choking, chest tightness, shortness of breath, wheezing and stridor.   Cardiovascular: Negative for chest pain, palpitations and leg swelling.  Gastrointestinal: Negative.   Genitourinary: Negative.   Musculoskeletal:  R shoulder pain, bilateral foot pain  Skin: Negative.   Neurological: Negative.   Hematological: Negative.   Psychiatric/Behavioral: Positive for dysphoric mood. Negative for self-injury, sleep disturbance and suicidal ideas. The patient is nervous/anxious.        Objective:   Physical Exam There were no vitals filed for this visit.   Gen: Pleasant, well-nourished, in no distress, anxious affect  ENT: No lesions,  mouth clear,  oropharynx clear, no postnasal drip  Neck: No JVD, no TMG, no carotid bruits  Lungs: No use of accessory muscles, no dullness to percussion, clear without rales or rhonchi  Cardiovascular: RRR, heart sounds normal, no murmur or gallops, no peripheral edema  Abdomen: soft and NT, no HSM,  BS normal  Musculoskeletal: No deformities, no cyanosis or clubbing  Neuro: alert, non focal  Skin: Warm, no lesions or rashes CBG (last 3)  501> 412 after 20 units Novolog sq    Assessment & Plan:  I personally reviewed all images and lab data in the [facility] system as well as any outside material available during this office visit and agree with the  radiology impressions.   No problem-specific Assessment & Plan notes found for this encounter.   There are no diagnoses linked to this encounter. 

## 2019-11-13 ENCOUNTER — Ambulatory Visit: Payer: Medicaid Other | Admitting: Critical Care Medicine

## 2019-12-12 ENCOUNTER — Encounter: Payer: Self-pay | Admitting: Critical Care Medicine

## 2019-12-12 DIAGNOSIS — E1165 Type 2 diabetes mellitus with hyperglycemia: Secondary | ICD-10-CM

## 2019-12-12 LAB — GLUCOSE, POCT (MANUAL RESULT ENTRY): POC Glucose: 581 mg/dl (ref 70–99)

## 2019-12-12 NOTE — [facility] Nurse Program (Signed)
  Dept: [facility] Nurse Program Note  Date of Encounter: 12/12/2019  Past Medical History: Past Medical History:  Diagnosis Date  . Abnormal Pap smear 1998  . Abnormal vaginal bleeding 12/19/2011  . Bacterial infection   . Bipolar 1 disorder ([place])   . Blister of second toe of left foot 11/21/2016  . Candida vaginitis 07/2007  . Depression    recently added wellbutrin-has not taken yet for bipolar  . Diabetes in pregnancy   . Diabetes mellitus    nph 20U qam and qpm, regular with meals  . Fibroid   . Galactorrhea of right breast 2008  . H/O amenorrhea 07/2007  . H/O dizziness 10/14/11  . H/O dysmenorrhea 2010  . H/O menorrhagia 10/14/11  . H/O varicella   . Headache(784.0)   . Heavy vaginal bleeding due to contraceptive injection use 10/12/2011   Depo Provera  . Herpes   . HSV-2 infection 01/03/2009  . Hx: UTI (urinary tract infection) 2009  . Hypertension    on aldomet  . Increased BMI 2010  . Irregular uterine bleeding 04/04/2012   Pt has mirena   . Obesity 10/14/11  . Oligomenorrhea 07/2007  . Pelvic pain in female   . Preterm labor   . Trichomonas   . Yeast infection     Encounter Details: CNP Questionnaire - 12/12/19 0406      Questionnaire   Patient Status  Not Applicable    Race  Black or African American    Location Patient Served At  Not Applicable    Insurance  Not Applicable    Uninsured  Uninsured (Subsequent visits/quarter)    Food  No food insecurities    Housing/Utilities  No permanent housing    Transportation  No transportation needs    Interpersonal Safety  Yes, feel physically and emotionally safe where you currently live    Medication  Yes, have medication insecurities    Referrals  Primary Care Provider/Clinic    ED Visit Averted  Yes    Life-Saving Intervention Made  Not Applicable     client in requesting appointment for female problems ,states she may have a vaginal infection/yeast infection and has used over the  counter medications. Has been seen at [facility] . Nurse called to get her an appointment ,office closed ,client was given number will try to call tomorrow ,nurse will follow up next week ! Blood pressure okay , blood sugar very high 581 mg,client hasn't been taking her    insulin or checking her blood sugars ,when ask why she hasn't been taking her medications she takes she has been working and lots of things going on Instructed client to go take her pm insulin now return to see nurse in 1 hour ,counseled regarding complications of diabetes and dangers  of not not taking medications. Nurse called PCP Dr Joya Gaskins regarding high blood sugar ,he ask that nurse have to increase Novolog to  to 20 units now and continue  her [place]. Nurse was unable to get up with client to inform her of increase and she didn't come back to recheck the blood sugar after she took her insulin Nurse tried to call her phone number in chart not working . Security stated client had left the building. Will follow up tomorrow when I drop off medications.

## 2019-12-12 NOTE — Congregational Nurse Program (Signed)
  Dept: [facility] Nurse Program Note  Date of Encounter: 12/12/2019  Past Medical History: Past Medical History:  Diagnosis Date  . Abnormal Pap smear 1998  . Abnormal vaginal bleeding 12/19/2011  . Bacterial infection   . Bipolar 1 disorder ([address])   . Blister of second toe of left foot 11/21/2016  . Candida vaginitis 07/2007  . Depression    recently added wellbutrin-has not taken yet for bipolar  . Diabetes in pregnancy   . Diabetes mellitus    nph 20U qam and qpm, regular with meals  . Fibroid   . Galactorrhea of right breast 2008  . H/O amenorrhea 07/2007  . H/O dizziness 10/14/11  . H/O dysmenorrhea 2010  . H/O menorrhagia 10/14/11  . H/O varicella   . Headache(784.0)   . Heavy vaginal bleeding due to contraceptive injection use 10/12/2011   Depo Provera  . Herpes   . HSV-2 infection 01/03/2009  . Hx: UTI (urinary tract infection) 2009  . Hypertension    on aldomet  . Increased BMI 2010  . Irregular uterine bleeding 04/04/2012   Pt has mirena   . Obesity 10/14/11  . Oligomenorrhea 07/2007  . Pelvic pain in female   . Preterm labor   . Trichomonas   . Yeast infection     Encounter Details: CNP Questionnaire - 12/12/19 0406      Questionnaire   Patient Status  Not Applicable    Race  Black or African American    Location Patient Served At  Not Applicable    Insurance  Not Applicable    Uninsured  Uninsured (Subsequent visits/quarter)    Food  No food insecurities    Housing/Utilities  No permanent housing    Transportation  No transportation needs    Interpersonal Safety  Yes, feel physically and emotionally safe where you currently live    Medication  Yes, have medication insecurities    Referrals  Primary Care Provider/Clinic    ED Visit Averted  Yes    Life-Saving Intervention Made  Not Applicable

## 2019-12-13 DIAGNOSIS — E1165 Type 2 diabetes mellitus with hyperglycemia: Secondary | ICD-10-CM

## 2019-12-14 ENCOUNTER — Telehealth: Payer: Self-pay

## 2019-12-14 LAB — GLUCOSE, POCT (MANUAL RESULT ENTRY): POC Glucose: 341 mg/dl (ref 70–99)

## 2019-12-14 NOTE — Telephone Encounter (Signed)
TC to [facility] to check on clients application for a orange card ,no record of any application Nurse will follow up.

## 2019-12-14 NOTE — Telephone Encounter (Signed)
Tc to case manager at [employer] will assist client today with completing application for orange card today and fax it in.

## 2019-12-14 NOTE — Congregational Nurse Program (Signed)
  Dept: [facility] Nurse Program Note  Date of Encounter: 12/13/2019  Past Medical History: Past Medical History:  Diagnosis Date  . Abnormal Pap smear 1998  . Abnormal vaginal bleeding 12/19/2011  . Bacterial infection   . Bipolar 1 disorder ([place])   . Blister of second toe of left foot 11/21/2016  . Candida vaginitis 07/2007  . Depression    recently added wellbutrin-has not taken yet for bipolar  . Diabetes in pregnancy   . Diabetes mellitus    nph 20U qam and qpm, regular with meals  . Fibroid   . Galactorrhea of right breast 2008  . H/O amenorrhea 07/2007  . H/O dizziness 10/14/11  . H/O dysmenorrhea 2010  . H/O menorrhagia 10/14/11  . H/O varicella   . Headache(784.0)   . Heavy vaginal bleeding due to contraceptive injection use 10/12/2011   Depo Provera  . Herpes   . HSV-2 infection 01/03/2009  . Hx: UTI (urinary tract infection) 2009  . Hypertension    on aldomet  . Increased BMI 2010  . Irregular uterine bleeding 04/04/2012   Pt has mirena   . Obesity 10/14/11  . Oligomenorrhea 07/2007  . Pelvic pain in female   . Preterm labor   . Trichomonas   . Yeast infection     Encounter Details: CNP Questionnaire - 12/14/19 2122      Questionnaire   Patient Status  Not Applicable    Race  Black or African American    Location Patient Served At  Not Applicable    Insurance  Not Applicable    Uninsured  Uninsured (Subsequent visits/quarter)    Food  No food insecurities    Housing/Utilities  No permanent housing    Transportation  No transportation needs    Interpersonal Safety  Yes, feel physically and emotionally safe where you currently live    Medication  Yes, have medication insecurities    Referrals  Primary Care Provider/Clinic;Orange Card/[facility]    ED Visit Averted  Not Applicable    Life-Saving Intervention Made  Not Applicable      Nurse secured clients updated phone number from case manager and called client in to follow up on  her blood sugar . Client came in and we secured her glucose monitor and she obtained her blood glucose level ,one to make sure client had a meter and could check her own sugar levels as nurse counseled her on the importance of checking her levels to control her levels and stressed the importance of taking her insulin as her blood sugar levels are out of control. Client agreed and seems to understand the importance but lets other things get in the way or simply forgets to check it. Took her meds last evening after seeing nurse and this am. Had breakfast today no lunch ,level 343 mg @3 :45 pm.Explained her level was very high on yesterday and that the nurse had made contact with her PCP but was unable to get back with her as she left the building.States she took the children to bible study. Agreed to take insulin after meals 3 times a day and her insulin at night. Will return to see the nurse on Tuesday with her meter to see if she has been checking her levels . Will follow up on orange card application and Women's Health appointment Nurse will notify Dr Viona Gilmore of today's results

## 2019-12-19 ENCOUNTER — Telehealth: Payer: Self-pay

## 2019-12-19 NOTE — Telephone Encounter (Signed)
Client was suppose to follow up with nurse on 12-18-2019 but failed to return ,left a note in her mail box to check with the nurse for follow up on 12-19-2019 ,client failed to show up

## 2020-02-09 ENCOUNTER — Encounter (HOSPITAL_COMMUNITY): Payer: Self-pay | Admitting: *Deleted

## 2020-02-09 ENCOUNTER — Other Ambulatory Visit: Payer: Self-pay

## 2020-02-09 ENCOUNTER — Inpatient Hospital Stay (HOSPITAL_COMMUNITY)
Admission: EM | Admit: 2020-02-09 | Discharge: 2020-02-26 | DRG: 242 | Disposition: A | Discharge status: Home Health | Payer: Medicaid Other | Attending: Internal Medicine | Admitting: Internal Medicine

## 2020-02-09 DIAGNOSIS — R0600 Dyspnea, unspecified: Secondary | ICD-10-CM

## 2020-02-09 DIAGNOSIS — E1121 Type 2 diabetes mellitus with diabetic nephropathy: Secondary | ICD-10-CM | POA: Diagnosis present

## 2020-02-09 DIAGNOSIS — R7989 Other specified abnormal findings of blood chemistry: Secondary | ICD-10-CM

## 2020-02-09 DIAGNOSIS — Z20822 Contact with and (suspected) exposure to covid-19: Secondary | ICD-10-CM | POA: Diagnosis present

## 2020-02-09 DIAGNOSIS — Z833 Family history of diabetes mellitus: Secondary | ICD-10-CM

## 2020-02-09 DIAGNOSIS — I5021 Acute systolic (congestive) heart failure: Secondary | ICD-10-CM | POA: Diagnosis present

## 2020-02-09 DIAGNOSIS — R778 Other specified abnormalities of plasma proteins: Secondary | ICD-10-CM

## 2020-02-09 DIAGNOSIS — D649 Anemia, unspecified: Secondary | ICD-10-CM | POA: Diagnosis present

## 2020-02-09 DIAGNOSIS — K852 Alcohol induced acute pancreatitis without necrosis or infection: Secondary | ICD-10-CM | POA: Diagnosis present

## 2020-02-09 DIAGNOSIS — G92 Toxic encephalopathy: Secondary | ICD-10-CM | POA: Diagnosis present

## 2020-02-09 DIAGNOSIS — Z978 Presence of other specified devices: Secondary | ICD-10-CM

## 2020-02-09 DIAGNOSIS — I5042 Chronic combined systolic (congestive) and diastolic (congestive) heart failure: Secondary | ICD-10-CM

## 2020-02-09 DIAGNOSIS — I2510 Atherosclerotic heart disease of native coronary artery without angina pectoris: Secondary | ICD-10-CM | POA: Diagnosis present

## 2020-02-09 DIAGNOSIS — Z8249 Family history of ischemic heart disease and other diseases of the circulatory system: Secondary | ICD-10-CM

## 2020-02-09 DIAGNOSIS — Z590 Homelessness: Secondary | ICD-10-CM

## 2020-02-09 DIAGNOSIS — Z0189 Encounter for other specified special examinations: Secondary | ICD-10-CM

## 2020-02-09 DIAGNOSIS — D509 Iron deficiency anemia, unspecified: Secondary | ICD-10-CM | POA: Diagnosis present

## 2020-02-09 DIAGNOSIS — E1110 Type 2 diabetes mellitus with ketoacidosis without coma: Secondary | ICD-10-CM

## 2020-02-09 DIAGNOSIS — R570 Cardiogenic shock: Secondary | ICD-10-CM | POA: Diagnosis present

## 2020-02-09 DIAGNOSIS — E1142 Type 2 diabetes mellitus with diabetic polyneuropathy: Secondary | ICD-10-CM

## 2020-02-09 DIAGNOSIS — E785 Hyperlipidemia, unspecified: Secondary | ICD-10-CM | POA: Diagnosis present

## 2020-02-09 DIAGNOSIS — E7800 Pure hypercholesterolemia, unspecified: Secondary | ICD-10-CM

## 2020-02-09 DIAGNOSIS — E871 Hypo-osmolality and hyponatremia: Secondary | ICD-10-CM | POA: Diagnosis present

## 2020-02-09 DIAGNOSIS — R9431 Abnormal electrocardiogram [ECG] [EKG]: Secondary | ICD-10-CM

## 2020-02-09 DIAGNOSIS — E872 Acidosis, unspecified: Secondary | ICD-10-CM

## 2020-02-09 DIAGNOSIS — J690 Pneumonitis due to inhalation of food and vomit: Secondary | ICD-10-CM | POA: Diagnosis not present

## 2020-02-09 DIAGNOSIS — E876 Hypokalemia: Secondary | ICD-10-CM | POA: Diagnosis not present

## 2020-02-09 DIAGNOSIS — Z91120 Patient's intentional underdosing of medication regimen due to financial hardship: Secondary | ICD-10-CM

## 2020-02-09 DIAGNOSIS — K8681 Exocrine pancreatic insufficiency: Secondary | ICD-10-CM

## 2020-02-09 DIAGNOSIS — J9601 Acute respiratory failure with hypoxia: Secondary | ICD-10-CM | POA: Diagnosis present

## 2020-02-09 DIAGNOSIS — I5043 Acute on chronic combined systolic (congestive) and diastolic (congestive) heart failure: Secondary | ICD-10-CM

## 2020-02-09 DIAGNOSIS — Z6841 Body Mass Index (BMI) 40.0 and over, adult: Secondary | ICD-10-CM

## 2020-02-09 DIAGNOSIS — Z95818 Presence of other cardiac implants and grafts: Secondary | ICD-10-CM

## 2020-02-09 DIAGNOSIS — R109 Unspecified abdominal pain: Secondary | ICD-10-CM

## 2020-02-09 DIAGNOSIS — Z91018 Allergy to other foods: Secondary | ICD-10-CM

## 2020-02-09 DIAGNOSIS — N179 Acute kidney failure, unspecified: Secondary | ICD-10-CM | POA: Diagnosis present

## 2020-02-09 DIAGNOSIS — Z823 Family history of stroke: Secondary | ICD-10-CM

## 2020-02-09 DIAGNOSIS — D7589 Other specified diseases of blood and blood-forming organs: Secondary | ICD-10-CM | POA: Diagnosis present

## 2020-02-09 DIAGNOSIS — E11649 Type 2 diabetes mellitus with hypoglycemia without coma: Secondary | ICD-10-CM | POA: Diagnosis not present

## 2020-02-09 DIAGNOSIS — K859 Acute pancreatitis without necrosis or infection, unspecified: Secondary | ICD-10-CM | POA: Diagnosis present

## 2020-02-09 DIAGNOSIS — I442 Atrioventricular block, complete: Principal | ICD-10-CM | POA: Diagnosis present

## 2020-02-09 DIAGNOSIS — I214 Non-ST elevation (NSTEMI) myocardial infarction: Secondary | ICD-10-CM

## 2020-02-09 DIAGNOSIS — K7200 Acute and subacute hepatic failure without coma: Secondary | ICD-10-CM | POA: Diagnosis present

## 2020-02-09 DIAGNOSIS — F1010 Alcohol abuse, uncomplicated: Secondary | ICD-10-CM | POA: Diagnosis present

## 2020-02-09 DIAGNOSIS — Z882 Allergy status to sulfonamides status: Secondary | ICD-10-CM

## 2020-02-09 DIAGNOSIS — F17210 Nicotine dependence, cigarettes, uncomplicated: Secondary | ICD-10-CM | POA: Diagnosis present

## 2020-02-09 DIAGNOSIS — E870 Hyperosmolality and hypernatremia: Secondary | ICD-10-CM | POA: Diagnosis not present

## 2020-02-09 DIAGNOSIS — I21A1 Myocardial infarction type 2: Secondary | ICD-10-CM | POA: Diagnosis present

## 2020-02-09 DIAGNOSIS — T383X6A Underdosing of insulin and oral hypoglycemic [antidiabetic] drugs, initial encounter: Secondary | ICD-10-CM | POA: Diagnosis present

## 2020-02-09 DIAGNOSIS — I110 Hypertensive heart disease with heart failure: Secondary | ICD-10-CM | POA: Diagnosis present

## 2020-02-09 DIAGNOSIS — R197 Diarrhea, unspecified: Secondary | ICD-10-CM | POA: Diagnosis present

## 2020-02-09 DIAGNOSIS — E559 Vitamin D deficiency, unspecified: Secondary | ICD-10-CM | POA: Diagnosis present

## 2020-02-09 DIAGNOSIS — E875 Hyperkalemia: Secondary | ICD-10-CM | POA: Diagnosis present

## 2020-02-09 DIAGNOSIS — M6282 Rhabdomyolysis: Secondary | ICD-10-CM | POA: Diagnosis not present

## 2020-02-09 DIAGNOSIS — I452 Bifascicular block: Secondary | ICD-10-CM | POA: Diagnosis present

## 2020-02-09 LAB — CBG MONITORING, ED: Glucose-Capillary: >600 mg/dL (ref 70–99)

## 2020-02-09 MED ORDER — SODIUM CHLORIDE 0.9% FLUSH
3.0000 mL | Freq: Once | INTRAVENOUS | Status: AC
  Administered 2020-02-10: 01:00:00 3 mL via INTRAVENOUS

## 2020-02-09 NOTE — ED Triage Notes (Signed)
Pt says that she has had vomiting all day, also had shortness of breath, chest pain and feels like she is going to pass out.

## 2020-02-10 ENCOUNTER — Emergency Department (HOSPITAL_COMMUNITY): Payer: Medicaid Other

## 2020-02-10 ENCOUNTER — Other Ambulatory Visit (HOSPITAL_COMMUNITY): Payer: Medicaid Other

## 2020-02-10 ENCOUNTER — Other Ambulatory Visit: Payer: Self-pay

## 2020-02-10 ENCOUNTER — Inpatient Hospital Stay (HOSPITAL_COMMUNITY): Payer: Medicaid Other

## 2020-02-10 ENCOUNTER — Inpatient Hospital Stay (HOSPITAL_COMMUNITY)
Admission: EM | Disposition: A | Discharge status: Home Health | Payer: Self-pay | Source: Home / Self Care | Attending: Internal Medicine

## 2020-02-10 DIAGNOSIS — D7589 Other specified diseases of blood and blood-forming organs: Secondary | ICD-10-CM | POA: Diagnosis present

## 2020-02-10 DIAGNOSIS — E1121 Type 2 diabetes mellitus with diabetic nephropathy: Secondary | ICD-10-CM | POA: Diagnosis present

## 2020-02-10 DIAGNOSIS — E559 Vitamin D deficiency, unspecified: Secondary | ICD-10-CM | POA: Diagnosis present

## 2020-02-10 DIAGNOSIS — E1111 Type 2 diabetes mellitus with ketoacidosis with coma: Secondary | ICD-10-CM

## 2020-02-10 DIAGNOSIS — K852 Alcohol induced acute pancreatitis without necrosis or infection: Secondary | ICD-10-CM | POA: Diagnosis present

## 2020-02-10 DIAGNOSIS — N179 Acute kidney failure, unspecified: Secondary | ICD-10-CM | POA: Diagnosis present

## 2020-02-10 DIAGNOSIS — E11649 Type 2 diabetes mellitus with hypoglycemia without coma: Secondary | ICD-10-CM | POA: Diagnosis not present

## 2020-02-10 DIAGNOSIS — J690 Pneumonitis due to inhalation of food and vomit: Secondary | ICD-10-CM | POA: Diagnosis not present

## 2020-02-10 DIAGNOSIS — K7200 Acute and subacute hepatic failure without coma: Secondary | ICD-10-CM | POA: Diagnosis present

## 2020-02-10 DIAGNOSIS — R570 Cardiogenic shock: Secondary | ICD-10-CM

## 2020-02-10 DIAGNOSIS — J9600 Acute respiratory failure, unspecified whether with hypoxia or hypercapnia: Secondary | ICD-10-CM

## 2020-02-10 DIAGNOSIS — K8681 Exocrine pancreatic insufficiency: Secondary | ICD-10-CM

## 2020-02-10 DIAGNOSIS — G92 Toxic encephalopathy: Secondary | ICD-10-CM | POA: Diagnosis present

## 2020-02-10 DIAGNOSIS — I214 Non-ST elevation (NSTEMI) myocardial infarction: Secondary | ICD-10-CM

## 2020-02-10 DIAGNOSIS — D509 Iron deficiency anemia, unspecified: Secondary | ICD-10-CM | POA: Diagnosis present

## 2020-02-10 DIAGNOSIS — I5032 Chronic diastolic (congestive) heart failure: Secondary | ICD-10-CM

## 2020-02-10 DIAGNOSIS — E870 Hyperosmolality and hypernatremia: Secondary | ICD-10-CM | POA: Diagnosis not present

## 2020-02-10 DIAGNOSIS — Z978 Presence of other specified devices: Secondary | ICD-10-CM

## 2020-02-10 DIAGNOSIS — I340 Nonrheumatic mitral (valve) insufficiency: Secondary | ICD-10-CM

## 2020-02-10 DIAGNOSIS — I5021 Acute systolic (congestive) heart failure: Secondary | ICD-10-CM | POA: Diagnosis present

## 2020-02-10 DIAGNOSIS — M6282 Rhabdomyolysis: Secondary | ICD-10-CM | POA: Diagnosis not present

## 2020-02-10 DIAGNOSIS — I10 Essential (primary) hypertension: Secondary | ICD-10-CM

## 2020-02-10 DIAGNOSIS — I442 Atrioventricular block, complete: Secondary | ICD-10-CM | POA: Diagnosis present

## 2020-02-10 DIAGNOSIS — E871 Hypo-osmolality and hyponatremia: Secondary | ICD-10-CM | POA: Diagnosis present

## 2020-02-10 DIAGNOSIS — E1110 Type 2 diabetes mellitus with ketoacidosis without coma: Secondary | ICD-10-CM | POA: Diagnosis present

## 2020-02-10 DIAGNOSIS — K859 Acute pancreatitis without necrosis or infection, unspecified: Secondary | ICD-10-CM | POA: Diagnosis present

## 2020-02-10 DIAGNOSIS — E1165 Type 2 diabetes mellitus with hyperglycemia: Secondary | ICD-10-CM

## 2020-02-10 DIAGNOSIS — Z20822 Contact with and (suspected) exposure to covid-19: Secondary | ICD-10-CM | POA: Diagnosis present

## 2020-02-10 DIAGNOSIS — Z6841 Body Mass Index (BMI) 40.0 and over, adult: Secondary | ICD-10-CM | POA: Diagnosis not present

## 2020-02-10 DIAGNOSIS — I21A1 Myocardial infarction type 2: Secondary | ICD-10-CM | POA: Diagnosis present

## 2020-02-10 DIAGNOSIS — E872 Acidosis, unspecified: Secondary | ICD-10-CM

## 2020-02-10 DIAGNOSIS — I361 Nonrheumatic tricuspid (valve) insufficiency: Secondary | ICD-10-CM

## 2020-02-10 DIAGNOSIS — I248 Other forms of acute ischemic heart disease: Secondary | ICD-10-CM

## 2020-02-10 DIAGNOSIS — I452 Bifascicular block: Secondary | ICD-10-CM | POA: Diagnosis present

## 2020-02-10 DIAGNOSIS — J9601 Acute respiratory failure with hypoxia: Secondary | ICD-10-CM | POA: Diagnosis present

## 2020-02-10 DIAGNOSIS — R079 Chest pain, unspecified: Secondary | ICD-10-CM

## 2020-02-10 DIAGNOSIS — R778 Other specified abnormalities of plasma proteins: Secondary | ICD-10-CM

## 2020-02-10 DIAGNOSIS — R579 Shock, unspecified: Secondary | ICD-10-CM

## 2020-02-10 HISTORY — PX: LEFT HEART CATH AND CORONARY ANGIOGRAPHY: CATH118249

## 2020-02-10 HISTORY — PX: TEMPORARY PACEMAKER: CATH118268

## 2020-02-10 LAB — POCT I-STAT 7, (LYTES, BLD GAS, ICA,H+H)
Acid-base deficit: 10 mmol/L — ABNORMAL HIGH (ref 0.0–2.0)
Acid-base deficit: 8 mmol/L — ABNORMAL HIGH (ref 0.0–2.0)
Bicarbonate: 17.3 mmol/L — ABNORMAL LOW (ref 20.0–28.0)
Bicarbonate: 17.8 mmol/L — ABNORMAL LOW (ref 20.0–28.0)
Calcium, Ion: 0.94 mmol/L — ABNORMAL LOW (ref 1.15–1.40)
Calcium, Ion: 1.02 mmol/L — ABNORMAL LOW (ref 1.15–1.40)
HCT: 31 % — ABNORMAL LOW (ref 36.0–46.0)
HCT: 31 % — ABNORMAL LOW (ref 36.0–46.0)
Hemoglobin: 10.5 g/dL — ABNORMAL LOW (ref 12.0–15.0)
Hemoglobin: 10.5 g/dL — ABNORMAL LOW (ref 12.0–15.0)
O2 Saturation: 99 %
O2 Saturation: 97 %
Patient temperature: 97.7
Patient temperature: 98.7
Potassium: 5.3 mmol/L — ABNORMAL HIGH (ref 3.5–5.1)
Potassium: 3.5 mmol/L (ref 3.5–5.1)
Sodium: 131 mmol/L — ABNORMAL LOW (ref 135–145)
Sodium: 130 mmol/L — ABNORMAL LOW (ref 135–145)
TCO2: 19 mmol/L — ABNORMAL LOW (ref 22–32)
TCO2: 19 mmol/L — ABNORMAL LOW (ref 22–32)
pCO2 arterial: 41.4 mmHg (ref 32.0–48.0)
pCO2 arterial: 36.7 mmHg (ref 32.0–48.0)
pH, Arterial: 7.228 — ABNORMAL LOW (ref 7.350–7.450)
pH, Arterial: 7.296 — ABNORMAL LOW (ref 7.350–7.450)
pO2, Arterial: 176 mmHg — ABNORMAL HIGH (ref 83.0–108.0)
pO2, Arterial: 96 mmHg (ref 83.0–108.0)

## 2020-02-10 LAB — BASIC METABOLIC PANEL
Anion gap: 31 — ABNORMAL HIGH (ref 5–15)
Anion gap: 30 — ABNORMAL HIGH (ref 5–15)
Anion gap: 13 (ref 5–15)
Anion gap: 13 (ref 5–15)
BUN: 27 mg/dL — ABNORMAL HIGH (ref 6–20)
BUN: 28 mg/dL — ABNORMAL HIGH (ref 6–20)
BUN: 28 mg/dL — ABNORMAL HIGH (ref 6–20)
BUN: 26 mg/dL — ABNORMAL HIGH (ref 6–20)
CO2: 8 mmol/L — ABNORMAL LOW (ref 22–32)
CO2: 10 mmol/L — ABNORMAL LOW (ref 22–32)
CO2: 18 mmol/L — ABNORMAL LOW (ref 22–32)
CO2: 19 mmol/L — ABNORMAL LOW (ref 22–32)
Calcium: 8.1 mg/dL — ABNORMAL LOW (ref 8.9–10.3)
Calcium: 7.9 mg/dL — ABNORMAL LOW (ref 8.9–10.3)
Calcium: 7.1 mg/dL — ABNORMAL LOW (ref 8.9–10.3)
Calcium: 7.1 mg/dL — ABNORMAL LOW (ref 8.9–10.3)
Chloride: 87 mmol/L — ABNORMAL LOW (ref 98–111)
Chloride: 91 mmol/L — ABNORMAL LOW (ref 98–111)
Chloride: 101 mmol/L (ref 98–111)
Chloride: 102 mmol/L (ref 98–111)
Creatinine, Ser: 3.04 mg/dL — ABNORMAL HIGH (ref 0.44–1.00)
Creatinine, Ser: 2.85 mg/dL — ABNORMAL HIGH (ref 0.44–1.00)
Creatinine, Ser: 2.39 mg/dL — ABNORMAL HIGH (ref 0.44–1.00)
Creatinine, Ser: 2 mg/dL — ABNORMAL HIGH (ref 0.44–1.00)
GFR calc Af Amer: 20 mL/min — ABNORMAL LOW (ref >60)
GFR calc Af Amer: 22 mL/min — ABNORMAL LOW (ref >60)
GFR calc Af Amer: 27 mL/min — ABNORMAL LOW (ref >60)
GFR calc Af Amer: 34 mL/min — ABNORMAL LOW (ref >60)
GFR calc non Af Amer: 18 mL/min — ABNORMAL LOW (ref >60)
GFR calc non Af Amer: 19 mL/min — ABNORMAL LOW (ref >60)
GFR calc non Af Amer: 24 mL/min — ABNORMAL LOW (ref >60)
GFR calc non Af Amer: 29 mL/min — ABNORMAL LOW (ref >60)
Glucose, Bld: 893 mg/dL (ref 70–99)
Glucose, Bld: 783 mg/dL (ref 70–99)
Glucose, Bld: 254 mg/dL — ABNORMAL HIGH (ref 70–99)
Glucose, Bld: 112 mg/dL — ABNORMAL HIGH (ref 70–99)
Potassium: 5.1 mmol/L (ref 3.5–5.1)
Potassium: 5.9 mmol/L — ABNORMAL HIGH (ref 3.5–5.1)
Potassium: 3.5 mmol/L (ref 3.5–5.1)
Potassium: 3.5 mmol/L (ref 3.5–5.1)
Sodium: 126 mmol/L — ABNORMAL LOW (ref 135–145)
Sodium: 131 mmol/L — ABNORMAL LOW (ref 135–145)
Sodium: 132 mmol/L — ABNORMAL LOW (ref 135–145)
Sodium: 134 mmol/L — ABNORMAL LOW (ref 135–145)

## 2020-02-10 LAB — URINALYSIS, ROUTINE W REFLEX MICROSCOPIC
Bilirubin Urine: NEGATIVE
Glucose, UA: >=500 mg/dL — AB
Ketones, ur: NEGATIVE mg/dL
Leukocytes,Ua: NEGATIVE
Nitrite: NEGATIVE
Protein, ur: 100 mg/dL — AB
Specific Gravity, Urine: 1.015 (ref 1.005–1.030)
pH: 5 (ref 5.0–8.0)

## 2020-02-10 LAB — GLUCOSE, CAPILLARY
Glucose-Capillary: 103 mg/dL — ABNORMAL HIGH (ref 70–99)
Glucose-Capillary: 105 mg/dL — ABNORMAL HIGH (ref 70–99)
Glucose-Capillary: 119 mg/dL — ABNORMAL HIGH (ref 70–99)
Glucose-Capillary: 136 mg/dL — ABNORMAL HIGH (ref 70–99)
Glucose-Capillary: 186 mg/dL — ABNORMAL HIGH (ref 70–99)
Glucose-Capillary: 224 mg/dL — ABNORMAL HIGH (ref 70–99)
Glucose-Capillary: 282 mg/dL — ABNORMAL HIGH (ref 70–99)
Glucose-Capillary: 377 mg/dL — ABNORMAL HIGH (ref 70–99)
Glucose-Capillary: 411 mg/dL — ABNORMAL HIGH (ref 70–99)
Glucose-Capillary: 416 mg/dL — ABNORMAL HIGH (ref 70–99)
Glucose-Capillary: 475 mg/dL — ABNORMAL HIGH (ref 70–99)
Glucose-Capillary: 498 mg/dL — ABNORMAL HIGH (ref 70–99)
Glucose-Capillary: 503 mg/dL (ref 70–99)
Glucose-Capillary: 504 mg/dL (ref 70–99)
Glucose-Capillary: 520 mg/dL (ref 70–99)
Glucose-Capillary: 526 mg/dL (ref 70–99)
Glucose-Capillary: 527 mg/dL (ref 70–99)
Glucose-Capillary: 528 mg/dL (ref 70–99)
Glucose-Capillary: 530 mg/dL (ref 70–99)
Glucose-Capillary: 533 mg/dL (ref 70–99)
Glucose-Capillary: 543 mg/dL (ref 70–99)
Glucose-Capillary: 544 mg/dL (ref 70–99)
Glucose-Capillary: 555 mg/dL (ref 70–99)
Glucose-Capillary: 74 mg/dL (ref 70–99)
Glucose-Capillary: 95 mg/dL (ref 70–99)
Glucose-Capillary: 96 mg/dL (ref 70–99)
Glucose-Capillary: >600 mg/dL (ref 70–99)
Glucose-Capillary: >600 mg/dL (ref 70–99)
Glucose-Capillary: >600 mg/dL (ref 70–99)

## 2020-02-10 LAB — HEPATIC FUNCTION PANEL
ALT: 294 U/L — ABNORMAL HIGH (ref 0–44)
AST: 582 U/L — ABNORMAL HIGH (ref 15–41)
Albumin: 2.5 g/dL — ABNORMAL LOW (ref 3.5–5.0)
Alkaline Phosphatase: 58 U/L (ref 38–126)
Bilirubin, Direct: 0.1 mg/dL (ref 0.0–0.2)
Indirect Bilirubin: 0.6 mg/dL (ref 0.3–0.9)
Total Bilirubin: 0.7 mg/dL (ref 0.3–1.2)
Total Protein: 5.5 g/dL — ABNORMAL LOW (ref 6.5–8.1)

## 2020-02-10 LAB — BETA-HYDROXYBUTYRIC ACID
Beta-Hydroxybutyric Acid: 0.82 mmol/L — ABNORMAL HIGH (ref 0.05–0.27)
Beta-Hydroxybutyric Acid: 0.21 mmol/L (ref 0.05–0.27)
Beta-Hydroxybutyric Acid: 0.1 mmol/L (ref 0.05–0.27)

## 2020-02-10 LAB — CBC
HCT: 38.2 % (ref 36.0–46.0)
HCT: 41 % (ref 36.0–46.0)
Hemoglobin: 11.7 g/dL — ABNORMAL LOW (ref 12.0–15.0)
Hemoglobin: 11.8 g/dL — ABNORMAL LOW (ref 12.0–15.0)
MCH: 29.9 pg (ref 26.0–34.0)
MCH: 29.9 pg (ref 26.0–34.0)
MCHC: 28.8 g/dL — ABNORMAL LOW (ref 30.0–36.0)
MCHC: 30.6 g/dL (ref 30.0–36.0)
MCV: 103.8 fL — ABNORMAL HIGH (ref 80.0–100.0)
MCV: 97.7 fL (ref 80.0–100.0)
Platelets: 207 10*3/uL (ref 150–400)
Platelets: 255 10*3/uL (ref 150–400)
RBC: 3.91 MIL/uL (ref 3.87–5.11)
RBC: 3.95 MIL/uL (ref 3.87–5.11)
RDW: 13.7 % (ref 11.5–15.5)
RDW: 13.7 % (ref 11.5–15.5)
WBC: 13 10*3/uL — ABNORMAL HIGH (ref 4.0–10.5)
WBC: 14.2 10*3/uL — ABNORMAL HIGH (ref 4.0–10.5)
nRBC: 0 % (ref 0.0–0.2)
nRBC: 0 % (ref 0.0–0.2)

## 2020-02-10 LAB — HEMOGLOBIN A1C
Hgb A1c MFr Bld: 12.9 % — ABNORMAL HIGH (ref 4.8–5.6)
Mean Plasma Glucose: 323.53 mg/dL

## 2020-02-10 LAB — RAPID URINE DRUG SCREEN, HOSP PERFORMED
Amphetamines: NOT DETECTED
Barbiturates: NOT DETECTED
Benzodiazepines: POSITIVE — AB
Cocaine: NOT DETECTED
Opiates: NOT DETECTED
Tetrahydrocannabinol: NOT DETECTED

## 2020-02-10 LAB — TROPONIN I (HIGH SENSITIVITY)
Troponin I (High Sensitivity): 730 ng/L (ref <18)
Troponin I (High Sensitivity): 1513 ng/L (ref <18)
Troponin I (High Sensitivity): 2024 ng/L (ref <18)

## 2020-02-10 LAB — COMPREHENSIVE METABOLIC PANEL
ALT: 275 U/L — ABNORMAL HIGH (ref 0–44)
AST: 736 U/L — ABNORMAL HIGH (ref 15–41)
Albumin: 2.6 g/dL — ABNORMAL LOW (ref 3.5–5.0)
Alkaline Phosphatase: 68 U/L (ref 38–126)
Anion gap: 18 — ABNORMAL HIGH (ref 5–15)
BUN: 29 mg/dL — ABNORMAL HIGH (ref 6–20)
CO2: 16 mmol/L — ABNORMAL LOW (ref 22–32)
Calcium: 6.7 mg/dL — ABNORMAL LOW (ref 8.9–10.3)
Chloride: 95 mmol/L — ABNORMAL LOW (ref 98–111)
Creatinine, Ser: 2.78 mg/dL — ABNORMAL HIGH (ref 0.44–1.00)
GFR calc Af Amer: 23 mL/min — ABNORMAL LOW (ref >60)
GFR calc non Af Amer: 20 mL/min — ABNORMAL LOW (ref >60)
Glucose, Bld: 538 mg/dL (ref 70–99)
Potassium: 5.9 mmol/L — ABNORMAL HIGH (ref 3.5–5.1)
Sodium: 129 mmol/L — ABNORMAL LOW (ref 135–145)
Total Bilirubin: 1 mg/dL (ref 0.3–1.2)
Total Protein: 5.5 g/dL — ABNORMAL LOW (ref 6.5–8.1)

## 2020-02-10 LAB — ECHOCARDIOGRAM COMPLETE
Height: 64 in
Weight: 3368.63 oz

## 2020-02-10 LAB — I-STAT BETA HCG BLOOD, ED (MC, WL, AP ONLY): I-stat hCG, quantitative: <5 m[IU]/mL (ref <5)

## 2020-02-10 LAB — LACTIC ACID, PLASMA
Lactic Acid, Venous: >11 mmol/L (ref 0.5–1.9)
Lactic Acid, Venous: 7.9 mmol/L (ref 0.5–1.9)
Lactic Acid, Venous: 1.9 mmol/L (ref 0.5–1.9)

## 2020-02-10 LAB — POCT I-STAT EG7
Acid-base deficit: 26 mmol/L — ABNORMAL HIGH (ref 0.0–2.0)
Bicarbonate: 6.4 mmol/L — ABNORMAL LOW (ref 20.0–28.0)
Calcium, Ion: 0.83 mmol/L — CL (ref 1.15–1.40)
HCT: 37 % (ref 36.0–46.0)
Hemoglobin: 12.6 g/dL (ref 12.0–15.0)
O2 Saturation: 98 %
Patient temperature: 37
Potassium: 5.5 mmol/L — ABNORMAL HIGH (ref 3.5–5.1)
Sodium: 120 mmol/L — ABNORMAL LOW (ref 135–145)
TCO2: 7 mmol/L — ABNORMAL LOW (ref 22–32)
pCO2, Ven: 34.4 mmHg — ABNORMAL LOW (ref 44.0–60.0)
pH, Ven: 6.877 — CL (ref 7.250–7.430)
pO2, Ven: 167 mmHg — ABNORMAL HIGH (ref 32.0–45.0)

## 2020-02-10 LAB — HIV ANTIBODY (ROUTINE TESTING W REFLEX): HIV Screen 4th Generation wRfx: NONREACTIVE

## 2020-02-10 LAB — MAGNESIUM: Magnesium: 1.2 mg/dL — ABNORMAL LOW (ref 1.7–2.4)

## 2020-02-10 LAB — RESPIRATORY PANEL BY RT PCR (FLU A&B, COVID)
Influenza A by PCR: NEGATIVE
Influenza B by PCR: NEGATIVE
SARS Coronavirus 2 by RT PCR: NEGATIVE

## 2020-02-10 LAB — PHOSPHORUS: Phosphorus: 2.7 mg/dL (ref 2.5–4.6)

## 2020-02-10 LAB — I-STAT CHEM 8, ED
BUN: 29 mg/dL — ABNORMAL HIGH (ref 6–20)
Calcium, Ion: 0.96 mmol/L — ABNORMAL LOW (ref 1.15–1.40)
Chloride: 93 mmol/L — ABNORMAL LOW (ref 98–111)
Creatinine, Ser: 2.9 mg/dL — ABNORMAL HIGH (ref 0.44–1.00)
Glucose, Bld: >700 mg/dL (ref 70–99)
HCT: 40 % (ref 36.0–46.0)
Hemoglobin: 13.6 g/dL (ref 12.0–15.0)
Potassium: 4.9 mmol/L (ref 3.5–5.1)
Sodium: 123 mmol/L — ABNORMAL LOW (ref 135–145)
TCO2: 9 mmol/L — ABNORMAL LOW (ref 22–32)

## 2020-02-10 LAB — OSMOLALITY, URINE: Osmolality, Ur: 362 mOsm/kg (ref 300–900)

## 2020-02-10 LAB — TRIGLYCERIDES: Triglycerides: 54 mg/dL (ref <150)

## 2020-02-10 LAB — NA AND K (SODIUM & POTASSIUM), RAND UR
Potassium Urine: 35 mmol/L
Sodium, Ur: 26 mmol/L

## 2020-02-10 LAB — OSMOLALITY: Osmolality: 314 mOsm/kg — ABNORMAL HIGH (ref 275–295)

## 2020-02-10 LAB — ACETAMINOPHEN LEVEL: Acetaminophen (Tylenol), Serum: <10 ug/mL — ABNORMAL LOW (ref 10–30)

## 2020-02-10 LAB — LIPASE, BLOOD: Lipase: 1879 U/L — ABNORMAL HIGH (ref 11–51)

## 2020-02-10 LAB — ETHANOL: Alcohol, Ethyl (B): <10 mg/dL (ref <10)

## 2020-02-10 LAB — MRSA PCR SCREENING: MRSA by PCR: NEGATIVE

## 2020-02-10 LAB — SALICYLATE LEVEL: Salicylate Lvl: <7 mg/dL — ABNORMAL LOW (ref 7.0–30.0)

## 2020-02-10 SURGERY — LEFT HEART CATH AND CORONARY ANGIOGRAPHY
Anesthesia: LOCAL

## 2020-02-10 MED ORDER — SODIUM CHLORIDE 0.9 % IV BOLUS
500.0000 mL | Freq: Once | INTRAVENOUS | Status: AC
  Administered 2020-02-10: 500 mL via INTRAVENOUS

## 2020-02-10 MED ORDER — PROPOFOL BOLUS VIA INFUSION
INTRAVENOUS | Status: DC | PRN
  Administered 2020-02-10: 02:00:00 50 mg via INTRAVENOUS
  Administered 2020-02-10 (×2): 10 mg via INTRAVENOUS

## 2020-02-10 MED ORDER — SODIUM CHLORIDE 0.9 % IV SOLN: INTRAVENOUS | Status: DC

## 2020-02-10 MED ORDER — CLONIDINE HCL 0.1 MG PO TABS: 0.1000 mg | ORAL_TABLET | Freq: Every day | ORAL | Status: DC

## 2020-02-10 MED ORDER — LABETALOL HCL 5 MG/ML IV SOLN: 10.0000 mg | INTRAVENOUS | Status: DC | PRN

## 2020-02-10 MED ORDER — ASPIRIN 300 MG RE SUPP
300.0000 mg | Freq: Once | RECTAL | Status: AC
  Administered 2020-02-10: 300 mg via RECTAL

## 2020-02-10 MED ORDER — IOHEXOL 350 MG/ML SOLN
INTRAVENOUS | Status: DC | PRN
  Administered 2020-02-10: 03:00:00 15 mL via INTRACARDIAC

## 2020-02-10 MED ORDER — INSULIN REGULAR(HUMAN) IN NACL 100-0.9 UT/100ML-% IV SOLN
INTRAVENOUS | Status: DC
  Filled 2020-02-10: qty 100

## 2020-02-10 MED ORDER — FENTANYL CITRATE (PF) 100 MCG/2ML IJ SOLN
50.0000 ug | Freq: Once | INTRAMUSCULAR | Status: AC
  Administered 2020-02-10: 06:00:00 50 ug via INTRAVENOUS

## 2020-02-10 MED ORDER — HEPARIN (PORCINE) 25000 UT/250ML-% IV SOLN
1000.0000 [IU]/h | INTRAVENOUS | Status: DC
  Administered 2020-02-10: 1000 [IU]/h via INTRAVENOUS
  Filled 2020-02-10: qty 250

## 2020-02-10 MED ORDER — ACETAMINOPHEN 325 MG PO TABS: 650.0000 mg | ORAL_TABLET | ORAL | Status: DC | PRN

## 2020-02-10 MED ORDER — PROPOFOL 1000 MG/100ML IV EMUL
INTRAVENOUS | Status: AC | PRN
  Administered 2020-02-10: 5 ug/kg/min via INTRAVENOUS

## 2020-02-10 MED ORDER — FENTANYL 2500MCG IN NS 250ML (10MCG/ML) PREMIX INFUSION
50.0000 ug/h | INTRAVENOUS | Status: DC
  Administered 2020-02-10: 50 ug/h via INTRAVENOUS

## 2020-02-10 MED ORDER — GABAPENTIN 250 MG/5ML PO SOLN
100.0000 mg | Freq: Three times a day (TID) | ORAL | Status: DC
  Administered 2020-02-10 – 2020-02-11 (×4): 100 mg
  Filled 2020-02-10 (×5): qty 2

## 2020-02-10 MED ORDER — ONDANSETRON HCL 4 MG/2ML IJ SOLN
INTRAMUSCULAR | Status: AC
  Filled 2020-02-10: qty 2

## 2020-02-10 MED ORDER — THIAMINE HCL 100 MG/ML IJ SOLN
100.0000 mg | INTRAMUSCULAR | Status: AC
  Administered 2020-02-10 – 2020-02-14 (×5): 100 mg via INTRAVENOUS
  Filled 2020-02-10 (×5): qty 2

## 2020-02-10 MED ORDER — MIDAZOLAM HCL 2 MG/2ML IJ SOLN
INTRAMUSCULAR | Status: AC
  Filled 2020-02-10: qty 2

## 2020-02-10 MED ORDER — DOCUSATE SODIUM 50 MG/5ML PO LIQD: 100.0000 mg | Freq: Two times a day (BID) | ORAL | Status: DC | PRN

## 2020-02-10 MED ORDER — SODIUM CHLORIDE 0.9 % IV SOLN
250.0000 mL | INTRAVENOUS | Status: DC | PRN
  Administered 2020-02-10 – 2020-02-11 (×2): 250 mL via INTRAVENOUS

## 2020-02-10 MED ORDER — INSULIN REGULAR(HUMAN) IN NACL 100-0.9 UT/100ML-% IV SOLN
INTRAVENOUS | Status: DC
  Administered 2020-02-10: 02:00:00 11.5 [IU]/h via INTRAVENOUS
  Filled 2020-02-10: qty 100

## 2020-02-10 MED ORDER — MIDAZOLAM HCL 2 MG/2ML IJ SOLN
2.0000 mg | INTRAMUSCULAR | Status: DC | PRN
  Filled 2020-02-10: qty 2

## 2020-02-10 MED ORDER — ONDANSETRON HCL 4 MG/2ML IJ SOLN: 4.0000 mg | Freq: Four times a day (QID) | INTRAMUSCULAR | Status: DC | PRN

## 2020-02-10 MED ORDER — DEXMEDETOMIDINE HCL IN NACL 400 MCG/100ML IV SOLN
0.4000 ug/kg/h | INTRAVENOUS | Status: DC
  Administered 2020-02-10 (×2): 0.4 ug/kg/h via INTRAVENOUS
  Filled 2020-02-10 (×3): qty 100

## 2020-02-10 MED ORDER — FOLIC ACID 5 MG/ML IJ SOLN
1.0000 mg | Freq: Every day | INTRAMUSCULAR | Status: DC
  Administered 2020-02-10 – 2020-02-13 (×4): 1 mg via INTRAVENOUS
  Filled 2020-02-10 (×5): qty 0.2

## 2020-02-10 MED ORDER — SODIUM BICARBONATE 8.4 % IV SOLN
INTRAVENOUS | Status: DC | PRN
  Administered 2020-02-10: 50 meq via INTRAVENOUS

## 2020-02-10 MED ORDER — CALCIUM GLUCONATE-NACL 2-0.675 GM/100ML-% IV SOLN
2.0000 g | Freq: Once | INTRAVENOUS | Status: AC
  Administered 2020-02-10: 09:00:00 2000 mg via INTRAVENOUS
  Filled 2020-02-10: qty 100

## 2020-02-10 MED ORDER — LACTATED RINGERS IV SOLN: INTRAVENOUS | Status: DC

## 2020-02-10 MED ORDER — DEXTROSE 50 % IV SOLN: 0.0000 mL | INTRAVENOUS | Status: DC | PRN

## 2020-02-10 MED ORDER — SODIUM CHLORIDE 0.9 % IV BOLUS
1000.0000 mL | Freq: Once | INTRAVENOUS | Status: AC
  Administered 2020-02-10: 04:00:00 1000 mL via INTRAVENOUS

## 2020-02-10 MED ORDER — ONDANSETRON HCL 4 MG/2ML IJ SOLN
4.0000 mg | Freq: Once | INTRAMUSCULAR | Status: AC
  Administered 2020-02-10: 4 mg via INTRAVENOUS

## 2020-02-10 MED ORDER — DEXTROSE 50 % IV SOLN
0.5000 g/kg | Freq: Once | INTRAVENOUS | Status: DC | PRN
  Filled 2020-02-10: qty 100

## 2020-02-10 MED ORDER — FENTANYL CITRATE (PF) 100 MCG/2ML IJ SOLN
50.0000 ug | Freq: Once | INTRAMUSCULAR | Status: AC
  Administered 2020-02-10: 04:00:00 50 ug via INTRAVENOUS
  Filled 2020-02-10: qty 2

## 2020-02-10 MED ORDER — SODIUM CHLORIDE 0.9% FLUSH
3.0000 mL | Freq: Two times a day (BID) | INTRAVENOUS | Status: DC
  Administered 2020-02-11 – 2020-02-22 (×12): 3 mL via INTRAVENOUS

## 2020-02-10 MED ORDER — HYDRALAZINE HCL 20 MG/ML IJ SOLN
10.0000 mg | INTRAMUSCULAR | Status: DC | PRN
  Filled 2020-02-10: qty 1

## 2020-02-10 MED ORDER — ENOXAPARIN SODIUM 30 MG/0.3ML ~~LOC~~ SOLN: 30.0000 mg | SUBCUTANEOUS | Status: DC

## 2020-02-10 MED ORDER — PHENYLEPHRINE HCL-NACL 10-0.9 MG/250ML-% IV SOLN
0.0000 ug/min | INTRAVENOUS | Status: DC
  Administered 2020-02-10 (×2): 50 ug/min via INTRAVENOUS
  Administered 2020-02-10: 100 ug/min via INTRAVENOUS
  Administered 2020-02-10: 08:00:00 20 ug/min via INTRAVENOUS
  Filled 2020-02-10 (×4): qty 250

## 2020-02-10 MED ORDER — DEXTROSE-NACL 5-0.45 % IV SOLN: INTRAVENOUS | Status: DC

## 2020-02-10 MED ORDER — PANTOPRAZOLE SODIUM 40 MG IV SOLR
40.0000 mg | Freq: Every day | INTRAVENOUS | Status: DC
  Administered 2020-02-10 – 2020-02-13 (×5): 40 mg via INTRAVENOUS
  Filled 2020-02-10 (×5): qty 40

## 2020-02-10 MED ORDER — MORPHINE SULFATE (PF) 4 MG/ML IV SOLN
4.0000 mg | Freq: Once | INTRAVENOUS | Status: DC
  Filled 2020-02-10: qty 1

## 2020-02-10 MED ORDER — DEXMEDETOMIDINE HCL IN NACL 400 MCG/100ML IV SOLN
0.0000 ug/kg/h | INTRAVENOUS | Status: DC
  Administered 2020-02-10: 0.4 ug/kg/h via INTRAVENOUS
  Filled 2020-02-10: qty 100

## 2020-02-10 MED ORDER — ORAL CARE MOUTH RINSE
15.0000 mL | OROMUCOSAL | Status: DC
  Administered 2020-02-10 – 2020-02-11 (×11): 15 mL via OROMUCOSAL

## 2020-02-10 MED ORDER — PROPOFOL 1000 MG/100ML IV EMUL
INTRAVENOUS | Status: AC
  Filled 2020-02-10: qty 100

## 2020-02-10 MED ORDER — FENTANYL BOLUS VIA INFUSION
50.0000 ug | INTRAVENOUS | Status: DC | PRN
  Filled 2020-02-10: qty 50

## 2020-02-10 MED ORDER — MIDAZOLAM HCL 2 MG/2ML IJ SOLN: 2.0000 mg | INTRAMUSCULAR | Status: DC | PRN

## 2020-02-10 MED ORDER — SODIUM CHLORIDE 0.9% FLUSH
3.0000 mL | INTRAVENOUS | Status: DC | PRN
  Administered 2020-02-12 – 2020-02-21 (×3): 3 mL via INTRAVENOUS

## 2020-02-10 MED ORDER — SODIUM CHLORIDE 0.9 % IV SOLN
INTRAVENOUS | Status: AC | PRN
  Administered 2020-02-10: 1000 mL via INTRAVENOUS

## 2020-02-10 MED ORDER — HEPARIN SODIUM (PORCINE) 5000 UNIT/ML IJ SOLN
5000.0000 [IU] | Freq: Three times a day (TID) | INTRAMUSCULAR | Status: DC
  Administered 2020-02-10 – 2020-02-11 (×5): 5000 [IU] via SUBCUTANEOUS
  Filled 2020-02-10 (×5): qty 1

## 2020-02-10 MED ORDER — GABAPENTIN 250 MG/5ML PO SOLN
100.0000 mg | Freq: Three times a day (TID) | ORAL | Status: DC
  Filled 2020-02-10 (×2): qty 2

## 2020-02-10 MED ORDER — MIDAZOLAM HCL 2 MG/2ML IJ SOLN
2.0000 mg | INTRAMUSCULAR | Status: AC | PRN
  Administered 2020-02-10 (×3): 2 mg via INTRAVENOUS
  Filled 2020-02-10 (×2): qty 2

## 2020-02-10 MED ORDER — HEPARIN (PORCINE) 25000 UT/250ML-% IV SOLN
1000.0000 [IU]/h | INTRAVENOUS | Status: DC
  Administered 2020-02-10: 1000 [IU]/h via INTRAVENOUS

## 2020-02-10 MED ORDER — PHENYLEPHRINE HCL-NACL 20-0.9 MG/250ML-% IV SOLN
0.0000 ug/min | INTRAVENOUS | Status: DC
  Administered 2020-02-10 – 2020-02-11 (×3): 90 ug/min via INTRAVENOUS
  Administered 2020-02-12: 50 ug/min via INTRAVENOUS
  Filled 2020-02-10 (×3): qty 250
  Filled 2020-02-10: qty 500
  Filled 2020-02-10: qty 250

## 2020-02-10 MED ORDER — FENTANYL 2500MCG IN NS 250ML (10MCG/ML) PREMIX INFUSION
50.0000 ug/h | INTRAVENOUS | Status: DC
  Administered 2020-02-10: 150 ug/h via INTRAVENOUS
  Filled 2020-02-10: qty 250

## 2020-02-10 MED ORDER — SODIUM BICARBONATE 8.4 % IV SOLN
INTRAVENOUS | Status: AC
  Filled 2020-02-10: qty 100

## 2020-02-10 MED ORDER — FENTANYL BOLUS VIA INFUSION
50.0000 ug | INTRAVENOUS | Status: DC | PRN
  Administered 2020-02-10 (×2): 50 ug via INTRAVENOUS
  Filled 2020-02-10: qty 50

## 2020-02-10 MED ORDER — HEPARIN SODIUM (PORCINE) 5000 UNIT/ML IJ SOLN
4000.0000 [IU] | Freq: Once | INTRAMUSCULAR | Status: AC
  Administered 2020-02-10: 4000 [IU] via INTRAVENOUS
  Filled 2020-02-10: qty 1

## 2020-02-10 MED ORDER — ASPIRIN 81 MG PO CHEW
81.0000 mg | CHEWABLE_TABLET | Freq: Every day | ORAL | Status: DC
  Administered 2020-02-10 – 2020-02-12 (×3): 81 mg via NASOGASTRIC
  Filled 2020-02-10 (×4): qty 1

## 2020-02-10 MED ORDER — ETOMIDATE 2 MG/ML IV SOLN
INTRAVENOUS | Status: AC | PRN
  Administered 2020-02-10: 20 mg via INTRAVENOUS

## 2020-02-10 MED ORDER — LIDOCAINE HCL (PF) 1 % IJ SOLN
INTRAMUSCULAR | Status: AC
  Filled 2020-02-10: qty 30

## 2020-02-10 MED ORDER — HEPARIN (PORCINE) IN NACL 1000-0.9 UT/500ML-% IV SOLN
INTRAVENOUS | Status: AC
  Filled 2020-02-10: qty 500

## 2020-02-10 MED ORDER — DEXTROSE 10% IV SOLUTION (ML/KG/HR)
5.0000 mL/kg/h | INTRAVENOUS | Status: DC
  Administered 2020-02-10 (×2): 5 mL/kg/h via INTRAVENOUS
  Filled 2020-02-10 (×14): qty 1000

## 2020-02-10 MED ORDER — SODIUM BICARBONATE 8.4 % IV SOLN
INTRAVENOUS | Status: DC | PRN
  Administered 2020-02-10 (×2): 50 meq via INTRAVENOUS

## 2020-02-10 MED ORDER — SUCCINYLCHOLINE CHLORIDE 20 MG/ML IJ SOLN
INTRAMUSCULAR | Status: AC | PRN
  Administered 2020-02-10: 150 mg via INTRAVENOUS

## 2020-02-10 MED ORDER — CHLORHEXIDINE GLUCONATE CLOTH 2 % EX PADS
6.0000 | MEDICATED_PAD | Freq: Every day | CUTANEOUS | Status: DC
  Administered 2020-02-10 – 2020-02-24 (×12): 6 via TOPICAL

## 2020-02-10 MED ORDER — LACTATED RINGERS IV BOLUS
500.0000 mL | Freq: Once | INTRAVENOUS | Status: AC
  Administered 2020-02-10: 500 mL via INTRAVENOUS

## 2020-02-10 MED ORDER — MIDAZOLAM HCL 5 MG/5ML IJ SOLN
INTRAMUSCULAR | Status: AC | PRN
  Administered 2020-02-10: 4 mg via INTRAVENOUS

## 2020-02-10 MED ORDER — SODIUM CHLORIDE 0.9 % IV SOLN: INTRAVENOUS | Status: DC | PRN

## 2020-02-10 MED ORDER — SODIUM CHLORIDE 0.9 % IV BOLUS
1000.0000 mL | Freq: Once | INTRAVENOUS | Status: AC
  Administered 2020-02-10: 05:00:00 1000 mL via INTRAVENOUS

## 2020-02-10 MED ORDER — SODIUM BICARBONATE 8.4 % IV SOLN
INTRAVENOUS | Status: AC
  Filled 2020-02-10: qty 50

## 2020-02-10 MED ORDER — HEPARIN SODIUM (PORCINE) 1000 UNIT/ML IJ SOLN
INTRAMUSCULAR | Status: AC
  Filled 2020-02-10: qty 1

## 2020-02-10 MED ORDER — INSULIN HIGH DOSE BOLUS VIA INFUSION (FOR BETA BLOCKER / CALCIUM CHANNEL BLOCKER OVERDOSE)
1.0000 [IU]/kg | Freq: Once | INTRAVENOUS | Status: AC
  Administered 2020-02-10: 09:00:00 95.5 [IU] via INTRAVENOUS
  Filled 2020-02-10: qty 96

## 2020-02-10 MED ORDER — ASPIRIN 81 MG PO CHEW
324.0000 mg | CHEWABLE_TABLET | Freq: Once | ORAL | Status: DC
  Filled 2020-02-10: qty 4

## 2020-02-10 MED ORDER — LIDOCAINE HCL (PF) 1 % IJ SOLN
INTRAMUSCULAR | Status: DC | PRN
  Administered 2020-02-10: 15 mL

## 2020-02-10 MED ORDER — NOREPINEPHRINE 4 MG/250ML-% IV SOLN: 0.0000 ug/min | INTRAVENOUS | Status: DC

## 2020-02-10 MED ORDER — HEPARIN (PORCINE) IN NACL 1000-0.9 UT/500ML-% IV SOLN
INTRAVENOUS | Status: DC | PRN
  Administered 2020-02-10: 500 mL

## 2020-02-10 MED ORDER — SODIUM BICARBONATE 8.4 % IV SOLN: 100.0000 meq | Freq: Once | INTRAVENOUS | Status: DC

## 2020-02-10 MED ORDER — INSULIN REGULAR(HUMAN) IN NACL 100-0.9 UT/100ML-% IV SOLN
INTRAVENOUS | Status: DC
  Administered 2020-02-10: 12:00:00 11.5 [IU]/h via INTRAVENOUS
  Filled 2020-02-10 (×2): qty 100

## 2020-02-10 MED ORDER — SODIUM CHLORIDE 0.9 % IV SOLN
0.0000 [IU]/kg/h | INTRAVENOUS | Status: DC
  Administered 2020-02-10: 09:00:00 1 [IU]/kg/h via INTRAVENOUS
  Filled 2020-02-10 (×3): qty 10

## 2020-02-10 MED ORDER — STERILE WATER FOR INJECTION IV SOLN
INTRAVENOUS | Status: DC
  Filled 2020-02-10: qty 850

## 2020-02-10 MED ORDER — QUETIAPINE FUMARATE 50 MG PO TABS
50.0000 mg | ORAL_TABLET | Freq: Two times a day (BID) | ORAL | Status: DC
  Filled 2020-02-10: qty 1

## 2020-02-10 MED ORDER — CHLORHEXIDINE GLUCONATE 0.12% ORAL RINSE (MEDLINE KIT)
15.0000 mL | Freq: Two times a day (BID) | OROMUCOSAL | Status: DC
  Administered 2020-02-10 – 2020-02-11 (×3): 15 mL via OROMUCOSAL

## 2020-02-10 MED ORDER — IOHEXOL 350 MG/ML SOLN
INTRAVENOUS | Status: AC
  Filled 2020-02-10: qty 1

## 2020-02-10 MED ORDER — ATORVASTATIN CALCIUM 80 MG PO TABS
80.0000 mg | ORAL_TABLET | Freq: Every day | ORAL | Status: DC
  Administered 2020-02-10 – 2020-02-13 (×4): 80 mg via NASOGASTRIC
  Filled 2020-02-10 (×4): qty 1

## 2020-02-10 SURGICAL SUPPLY — 11 items
CABLE ADAPT PACING TEMP 12FT (ADAPTER) ×1 IMPLANT
CATH INFINITI 5FR MULTPACK ANG (CATHETERS) ×1 IMPLANT
CATH S G BIP PACING (CATHETERS) ×1 IMPLANT
ELECT DEFIB PAD ADLT CADENCE (PAD) ×1 IMPLANT
KIT HEART LEFT (KITS) ×2 IMPLANT
PACK CARDIAC CATHETERIZATION (CUSTOM PROCEDURE TRAY) ×2 IMPLANT
SHEATH PINNACLE 6F 10CM (SHEATH) ×2 IMPLANT
SLEEVE REPOSITIONING LENGTH 30 (MISCELLANEOUS) ×1 IMPLANT
TRANSDUCER W/STOPCOCK (MISCELLANEOUS) ×2 IMPLANT
TUBING CIL FLEX 10 FLL-RA (TUBING) ×2 IMPLANT
WIRE EMERALD 3MM-J .035X150CM (WIRE) ×1 IMPLANT

## 2020-02-10 NOTE — Progress Notes (Signed)
ANTICOAGULATION CONSULT NOTE - Initial Consult  Pharmacy Consult for heparin Indication: chest pain/ACS  Allergies  Allergen Reactions  . Strawberry Extract Hives  . Sulfa Antibiotics Hives and Itching    Patient Measurements: Height: '5\' 4"'$  (162.6 cm) Weight: 209 lb 14.1 oz (95.2 kg) IBW/kg (Calculated) : 54.7 Heparin Dosing Weight: 75kg  Vital Signs: Temp: 97.7 F (36.5 C) (03/07 0345) Temp Source: Oral (03/07 0345) BP: 125/85 (03/07 0445) Pulse Rate: 80 (03/07 0331)  Labs: Recent Labs    02/10/20 0002 02/10/20 0002 02/10/20 0039 02/10/20 0039 02/10/20 0133 02/10/20 0145 02/10/20 0344  HGB 11.8*   < > 13.6   < >  --  12.6 11.7*  HCT 41.0   < > 40.0  --   --  37.0 38.2  PLT 255  --   --   --   --   --  207  CREATININE 3.04*  --  2.90*  --   --   --  2.85*  TROPONINIHS 730*  --   --   --  1,513*  --  2,024*   < > = values in this interval not displayed.    Estimated Creatinine Clearance: 27.6 mL/min (A) (by C-G formula based on SCr of 2.85 mg/dL (H)).   Medical History: Past Medical History:  Diagnosis Date  . Abnormal Pap smear 1998  . Abnormal vaginal bleeding 12/19/2011  . Bacterial infection   . Bipolar 1 disorder ([place])   . Blister of second toe of left foot 11/21/2016  . Candida vaginitis 07/2007  . Depression    recently added wellbutrin-has not taken yet for bipolar  . Diabetes in pregnancy   . Diabetes mellitus    nph 20U qam and qpm, regular with meals  . Fibroid   . Galactorrhea of right breast 2008  . H/O amenorrhea 07/2007  . H/O dizziness 10/14/11  . H/O dysmenorrhea 2010  . H/O menorrhagia 10/14/11  . H/O varicella   . Headache(784.0)   . Heavy vaginal bleeding due to contraceptive injection use 10/12/2011   Depo Provera  . Herpes   . HSV-2 infection 01/03/2009  . Hx: UTI (urinary tract infection) 2009  . Hypertension    on aldomet  . Increased BMI 2010  . Irregular uterine bleeding 04/04/2012   Pt has mirena   . Obesity 10/14/11  .  Oligomenorrhea 07/2007  . Pelvic pain in female   . Preterm labor   . Trichomonas   . Yeast infection     Medications:  Facility-Administered Medications Prior to Admission  Medication Dose Route Frequency Provider Last Rate Last Admin  . insulin aspart (novoLOG) injection 20 Units  20 Units Subcutaneous Once [name], MD       Medications Prior to Admission  Medication Sig Dispense Refill Last Dose  . amLODipine (NORVASC) 10 MG tablet Take 1 tablet (10 mg total) by mouth daily. 90 tablet 3   . aspirin EC 81 MG tablet Take 1 tablet (81 mg total) by mouth daily. 60 tablet 3   . atorvastatin (LIPITOR) 40 MG tablet Take 1 tablet (40 mg total) by mouth daily. 90 tablet 3   . blood glucose meter kit and supplies KIT Dispense based on patient and insurance preference. Use up to four times daily as directed. (FOR ICD-9 250.00, 250.01). 1 each 0   . Blood Glucose Monitoring Suppl (TRUE METRIX METER) w/Device KIT 1 each by Does not apply route as needed. 1 kit 0   .  Cetirizine HCl 10 MG CAPS Take 1 capsule (10 mg total) by mouth daily for 10 days. 10 capsule 0   . cloNIDine (CATAPRES) 0.1 MG tablet Take 1 tablet (0.1 mg total) by mouth 2 (two) times daily. 60 tablet 3   . glucose blood (TRUE METRIX BLOOD GLUCOSE TEST) test strip 1 each by Other route every morning. 90 each 3   . insulin aspart (NOVOLOG FLEXPEN) 100 UNIT/ML FlexPen Inject 8 Units into the skin 3 (three) times daily with meals. 15 mL 11   . Insulin Glargine (BASAGLAR KWIKPEN) 100 UNIT/ML SOPN Inject 0.8 mLs (80 Units total) into the skin daily. 15 mL 2   . Insulin Pen Needle (B-D ULTRAFINE III SHORT PEN) 31G X 8 MM MISC 1 application by Does not apply route daily. 90 each 3   . Lancet Devices (ADJUSTABLE LANCING DEVICE) MISC Inject 1 each into the skin 3 (three) times daily. 100 each 5   . Lancets 30G MISC 1 each by Does not apply route 3 (three) times daily. 100 each 3   . losartan-hydrochlorothiazide (HYZAAR) 100-25 MG  tablet Take 1 tablet by mouth daily. 90 tablet 3   . lurasidone (LATUDA) 40 MG TABS tablet Take 40 mg by mouth daily with breakfast.     . meloxicam (MOBIC) 7.5 MG tablet Take 1 tablet (7.5 mg total) by mouth daily. During painful menses 30 tablet 3   . triamcinolone ointment (KENALOG) 0.5 % Apply thin amount twice daily to area of itching 30 g 0    Scheduled:  . aspirin  324 mg Oral Once  . aspirin  81 mg Per NG tube Daily  . atorvastatin  80 mg Per NG tube q1800  . folic acid  1 mg Intravenous Daily  . gabapentin  100 mg Per Tube Q8H  .  morphine injection  4 mg Intravenous Once  . pantoprazole (PROTONIX) IV  40 mg Intravenous QHS  . QUEtiapine  50 mg Per Tube Q12H  . sodium chloride flush  3 mL Intravenous Q12H  . thiamine injection  100 mg Intravenous Q24H   Infusions:  . sodium chloride 75 mL/hr at 02/10/20 0344  . sodium chloride    . sodium chloride    . dextrose 5 % and 0.45% NaCl    . fentaNYL infusion INTRAVENOUS 150 mcg/hr (02/10/20 0422)  . insulin 11.5 Units/hr (02/10/20 0341)  .  sodium bicarbonate (isotonic) infusion in sterile water 100 mL/hr at 02/10/20 0400    Assessment: 49yo female c/o acute onset of CP associated w/ N/V and SOB, found to be in complete heart block, sent to cath for pacer, coronary arteries clear but troponin continues to rise (730 > 1513 > 2024), to begin heparin.  Pt had initially been started on heparin in ED, stopped in cath lab; sheath removed at 0247.  Goal of Therapy:  Heparin level 0.3-0.7 units/ml Monitor platelets by anticoagulation protocol: Yes   Plan:  At 0900 will resume heparin gtt at 1000 units/hr and monitor heparin levels and CBC.  Wynona Neat, PharmD, BCPS  02/10/2020,5:23 AM

## 2020-02-10 NOTE — Progress Notes (Addendum)
PCCM OVERNIGHT   Post Cath Lab: Pt seen and examined Right leg stabilized due to TVP placement in right femoral Pt waking up on vent, coughing, tracking with eyes, moving all extremities  Per Cardiology>> clean coronaries however troponin continues to rise Heparin per ACS protocol >> verified by Clinical pharmacist for start at [place] ECHO pending to assess LVEF  Dept on TVP at rate 80.  Left IJ CVC and radial A line were placed for increased access, repeated lab draws and advanced mgmt.  Looked for other sources for AG acidosis besides Blood glucose UDS negative ( benzo is from versed push given per sedation protocol) LA >11 --> indicating significant degree of hypoperfusion during bradycardic peri arrest when pt was in complete block  ABG: 7.228/41/176/17-->  BG 528 mg/dl Insulin at 11.5  Active Plan:  Discontinue  bicarbonate gtt ph >7.2 Follow up Serum osm  ( for Osm gap) F/u ETOH level Continue on sedation w/ fentanyl gtt and prn versed-> pt is sensitive to Propofol and Precedex and drops her BP Weaned FiO2 from 80% to 60% -> O2 sat remains at 97% F/u BMP/ VBG Q 4  F/u ECHO-> given the rising trop in setting of clean coronaries. Restart Heparin at 0900   CC Time 30 mins  .Marland KitchenSigned Dr Seward [facility] Locums

## 2020-02-10 NOTE — Code Documentation (Signed)
Cardiology at bedside, pt to intubated

## 2020-02-10 NOTE — ED Provider Notes (Addendum)
[facility] EMERGENCY DEPARTMENT Provider Note   CSN: [id number] Arrival date & time: 02/09/20  2341     History Chief Complaint  Patient presents with  . Chest Pain    Sarah Long is a 49 y.o. female.  HPI     This is a 49 year old female with a history of diabetes, hypertension, hyperlipidemia, bipolar disorder who presents with chest pain.  Patient reports acute onset of chest pain 4 hours prior to arrival.  She describes it as pressure.  She also reports associated shortness of breath.  She is very uncomfortable appearing on my evaluation and provides limited history.  She does report that she normally takes diabetes and blood pressure medications but has been unable to afford her insulin and oral glucose agents.  Currently she rates her pain at 10 out of 10.  She did not take anything for her pain prior to arrival.  Denies lower extremity swelling.  She does report shortness of breath is worse with laying flat.  Patient denies fever or cough.  Level 5 caveat for acuity of condition.  Past Medical History:  Diagnosis Date  . Abnormal Pap smear 1998  . Abnormal vaginal bleeding 12/19/2011  . Bacterial infection   . Bipolar 1 disorder ([place])   . Blister of second toe of left foot 11/21/2016  . Candida vaginitis 07/2007  . Depression    recently added wellbutrin-has not taken yet for bipolar  . Diabetes in pregnancy   . Diabetes mellitus    nph 20U qam and qpm, regular with meals  . Fibroid   . Galactorrhea of right breast 2008  . H/O amenorrhea 07/2007  . H/O dizziness 10/14/11  . H/O dysmenorrhea 2010  . H/O menorrhagia 10/14/11  . H/O varicella   . Headache(784.0)   . Heavy vaginal bleeding due to contraceptive injection use 10/12/2011   Depo Provera  . Herpes   . HSV-2 infection 01/03/2009  . Hx: UTI (urinary tract infection) 2009  . Hypertension    on aldomet  . Increased BMI 2010  . Irregular uterine bleeding 04/04/2012   Pt has mirena   .  Obesity 10/14/11  . Oligomenorrhea 07/2007  . Pelvic pain in female   . Preterm labor   . Trichomonas   . Yeast infection     Patient Active Problem List   Diagnosis Date Noted  . Complete heart block ([place]) 02/10/2020  . Dysmenorrhea 10/02/2019  . DM type 2 with diabetic peripheral neuropathy ([place]) 05/30/2019  . 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 block) 05/29/2019  . Bilateral bunions 05/29/2019  . Chronic right shoulder pain 05/29/2019  . Diabetes type 2, uncontrolled ([place]) 10/21/2016  . Smoking 11/26/2013  . Hypercholesteremia 02/02/2007  . Bipolar 1 disorder ([place]) 02/02/2007  . HYPERTENSION, BENIGN SYSTEMIC 02/02/2007    Past Surgical History:  Procedure Laterality Date  . CESAREAN SECTION  1991     OB History    Gravida  6   Para  4   Term  1   Preterm  3   AB  2   Living  4     SAB  1   TAB  1   Ectopic  0   Multiple  0   Live Births  3           Family History  Problem Relation Age of Onset  . Hypertension Mother   . Diabetes Mother   . Heart disease Mother   .  Hypertension Father   . Diabetes Father   . Heart disease Father   . Stroke Maternal Grandfather   . Other Neg Hx     Social History   Tobacco Use  . Smoking status: Current Every Day Smoker    Packs/day: 0.50    Years: 20.00    Pack years: 10.00    Types: Cigarettes  . Smokeless tobacco: Never Used  Substance Use Topics  . Alcohol use: No  . Drug use: No    Home Medications Prior to Admission medications   Medication Sig Start Date End Date Taking? Authorizing Provider  amLODipine (NORVASC) 10 MG tablet Take 1 tablet (10 mg total) by mouth daily. 10/02/19   [name], MD  aspirin EC 81 MG tablet Take 1 tablet (81 mg total) by mouth daily. 05/02/19   [name], MD  atorvastatin (LIPITOR) 40 MG tablet Take 1 tablet (40 mg total) by mouth daily. 10/02/19   [name], MD  blood glucose meter kit and supplies KIT Dispense  based on patient and insurance preference. Use up to four times daily as directed. (FOR ICD-9 250.00, 250.01). 11/14/18   Clent Demark, PA-C  Blood Glucose Monitoring Suppl (TRUE METRIX METER) w/Device KIT 1 each by Does not apply route as needed. 10/17/18   Clent Demark, PA-C  Cetirizine HCl 10 MG CAPS Take 1 capsule (10 mg total) by mouth daily for 10 days. 09/26/19 10/06/19  Wieters, Hallie C, PA-C  cloNIDine (CATAPRES) 0.1 MG tablet Take 1 tablet (0.1 mg total) by mouth 2 (two) times daily. 10/02/19   [name], MD  glucose blood (TRUE METRIX BLOOD GLUCOSE TEST) test strip 1 each by Other route every morning. 05/02/19   [name], MD  insulin aspart (NOVOLOG FLEXPEN) 100 UNIT/ML FlexPen Inject 8 Units into the skin 3 (three) times daily with meals. 10/02/19   [name], MD  Insulin Glargine (BASAGLAR KWIKPEN) 100 UNIT/ML SOPN Inject 0.8 mLs (80 Units total) into the skin daily. 07/10/19   [name], MD  Insulin Pen Needle (B-D ULTRAFINE III SHORT PEN) 31G X 8 MM MISC 1 application by Does not apply route daily. 05/02/19   [name], MD  Lancet Devices (ADJUSTABLE LANCING DEVICE) MISC Inject 1 each into the skin 3 (three) times daily. 10/17/18   Clent Demark, PA-C  Lancets 30G MISC 1 each by Does not apply route 3 (three) times daily. 05/02/19   [name], MD  losartan-hydrochlorothiazide (HYZAAR) 100-25 MG tablet Take 1 tablet by mouth daily. 10/02/19   [name], MD  lurasidone (LATUDA) 40 MG TABS tablet Take 40 mg by mouth daily with breakfast.    [provider]  meloxicam (MOBIC) 7.5 MG tablet Take 1 tablet (7.5 mg total) by mouth daily. During painful menses 10/02/19   [name], MD  triamcinolone ointment (KENALOG) 0.5 % Apply thin amount twice daily to area of itching 09/26/19   Wieters, [place] C, PA-C    Allergies    Strawberry extract and Sulfa antibiotics  Review of Systems   Review of Systems    Constitutional: Negative for fever.  Respiratory: Negative for shortness of breath.   Cardiovascular: Positive for chest pain. Negative for leg swelling.  Gastrointestinal: Negative for abdominal pain, nausea and vomiting.  Genitourinary: Negative for dysuria.  Neurological: Negative for weakness.  All other systems reviewed and are negative.   Physical Exam Updated Vital Signs BP [name])  141/71   Pulse (!) 41   Temp (!) 95.3 F (35.2 C) (Rectal)   Resp (!) 46   Ht 1.626 m (5' 4")   Wt 95.2 kg   LMP 02/07/2020 Comment: negative preg test  SpO2 98%   BMI 36.03 kg/m   Physical Exam Vitals and nursing note reviewed.  Constitutional:      Appearance: She is well-developed. She is obese.     Comments: Ill-appearing, uncomfortable  HENT:     Head: Normocephalic and atraumatic.  Eyes:     Pupils: Pupils are equal, round, and reactive to light.  Neck:     Vascular: JVD present.  Cardiovascular:     Rate and Rhythm: Regular rhythm. Bradycardia present.     Heart sounds: Normal heart sounds.  Pulmonary:     Effort: Pulmonary effort is normal. Tachypnea present. No respiratory distress.     Breath sounds: No wheezing.     Comments: Limited secondary to body habitus Abdominal:     General: Bowel sounds are normal.     Palpations: Abdomen is soft.  Musculoskeletal:     Cervical back: Neck supple.     Comments: Trace bilateral lower extremity edema  Skin:    General: Skin is warm and dry.  Neurological:     General: No focal deficit present.     Mental Status: She is alert and oriented to person, place, and time.     ED Results / Procedures / Treatments   Labs (all labs ordered are listed, but only abnormal results are displayed) Labs Reviewed  BASIC METABOLIC PANEL - Abnormal; Notable for the following components:      Result Value   Sodium 126 (*)    Chloride 87 (*)    CO2 8 (*)    Glucose, Bld 893 (*)    BUN 27 (*)    Creatinine, Ser 3.04 (*)    Calcium 8.1 (*)     GFR calc non Af Amer 18 (*)    GFR calc Af Amer 20 (*)    Anion gap 31 (*)    All other components within normal limits  CBC - Abnormal; Notable for the following components:   WBC 13.0 (*)    Hemoglobin 11.8 (*)    MCV 103.8 (*)    MCHC 28.8 (*)    All other components within normal limits  CBG MONITORING, ED - Abnormal; Notable for the following components:   Glucose-Capillary >600 (*)    All other components within normal limits  I-STAT CHEM 8, ED - Abnormal; Notable for the following components:   Sodium 123 (*)    Chloride 93 (*)    BUN 29 (*)    Creatinine, Ser 2.90 (*)    Glucose, Bld >700 (*)    Calcium, Ion 0.96 (*)    TCO2 9 (*)    All other components within normal limits  POCT I-STAT EG7 - Abnormal; Notable for the following components:   pH, Ven 6.877 (*)    pCO2, Ven 34.4 (*)    pO2, Ven 167.0 (*)    Bicarbonate 6.4 (*)    TCO2 7 (*)    Acid-base deficit 26.0 (*)    Sodium 120 (*)    Potassium 5.5 (*)    Calcium, Ion 0.83 (*)    All other components within normal limits  TROPONIN I (HIGH SENSITIVITY) - Abnormal; Notable for the following components:   Troponin I (High Sensitivity) 730 (*)    All  other components within normal limits  RESPIRATORY PANEL BY RT PCR (FLU A&B, COVID)  BETA-HYDROXYBUTYRIC ACID  BETA-HYDROXYBUTYRIC ACID  BETA-HYDROXYBUTYRIC ACID  URINALYSIS, ROUTINE W REFLEX MICROSCOPIC  HIV ANTIBODY (ROUTINE TESTING W REFLEX)  BLOOD GAS, ARTERIAL  RAPID URINE DRUG SCREEN, HOSP PERFORMED  LACTIC ACID, PLASMA  LACTIC ACID, PLASMA  I-STAT BETA HCG BLOOD, ED ([facility], [facility], [facility] ONLY)  I-STAT VENOUS BLOOD GAS, ED  TROPONIN I (HIGH SENSITIVITY)  TROPONIN I (HIGH SENSITIVITY)    EKG EKG Interpretation  Date/Time:  Saturday February 09 2020 23:48:11 EST Ventricular Rate:  40 PR Interval:    QRS Duration: 162 QT Interval:  574 QTC Calculation: 467 R Axis:   -87 Text Interpretation: Critical Test Result: Arrhythmia Idioventricular rhythm Left axis  deviation Non-specific intra-ventricular conduction block Left ventricular hypertrophy ( Cornell product , Romhilt-Estes ) Inferior infarct , age undetermined Abnormal ECG with complete heart block Confirmed by Thayer Jew [phone]) on 02/10/2020 12:06:09 AM   Radiology DG Chest Portable 1 View  Result Date: 02/10/2020 CLINICAL DATA:  Intubation EXAM: PORTABLE CHEST 1 VIEW COMPARISON:  In February 10, 2020 FINDINGS: The endotracheal tube terminates above the carina by approximately 2.4 cm. The prominent interstitial lung markings are again noted. There is cardiomegaly pneumothorax. There may be small bilateral pleural effusions. There is no acute osseous abnormality. IMPRESSION: Endotracheal tube terminates above the carina by approximately 2.4 cm. Cardiomegaly with prominent interstitial lung markings which may represent pulmonary edema or an atypical infectious process. Electronically Signed   By: Constance Holster M.D.   On: 02/10/2020 01:16   DG Chest Portable 1 View  Result Date: 02/10/2020 CLINICAL DATA:  Shortness of breath. Vomiting. EXAM: PORTABLE CHEST 1 VIEW COMPARISON:  None. FINDINGS: Low lung volumes. Mild cardiomegaly. Interstitial prominence suggest pulmonary edema. No large pleural effusion. No pneumothorax. Overlying monitoring devices project over the chest. IMPRESSION: Mild cardiomegaly. Interstitial prominence suggest pulmonary edema. Low lung volumes. Electronically Signed   By: Keith Rake M.D.   On: 02/10/2020 00:33    Procedures .Critical Care Performed by: Merryl Hacker, MD Authorized by: Merryl Hacker, MD   Critical care provider statement:    Critical care time (minutes):  95   Critical care start time:  02/10/2020 12:45 AM   Critical care end time:  02/10/2020 2:23 AM   Critical care time was exclusive of:  Separately billable procedures and treating other patients   Critical care was necessary to treat or prevent imminent or life-threatening deterioration  of the following conditions:  Cardiac failure and metabolic crisis   Critical care was time spent personally by me on the following activities:  Discussions with consultants, evaluation of patient's response to treatment, examination of patient, ordering and performing treatments and interventions, ordering and review of laboratory studies, ordering and review of radiographic studies, pulse oximetry, re-evaluation of patient's condition, obtaining history from patient or surrogate and review of old charts Procedure Name: Intubation Date/Time: 02/10/2020 2:23 AM Performed by: Merryl Hacker, MD Pre-anesthesia Checklist: Patient identified, Patient being monitored, Emergency Drugs available, Timeout performed and Suction available Oxygen Delivery Method: Simple face mask Preoxygenation: Pre-oxygenation with 100% oxygen Induction Type: Rapid sequence Ventilation: Mask ventilation without difficulty Laryngoscope Size: Glidescope and 4 Grade View: Grade IV Tube size: 7.5 mm Number of attempts: 2 Placement Confirmation: ETT inserted through vocal cords under direct vision,  CO2 detector and Breath sounds checked- equal and bilateral Secured at: 22 cm Tube secured with: ETT holder Dental Injury: Teeth and Oropharynx  as per pre-operative assessment  Difficulty Due To: Difficulty was anticipated Comments: Difficult airway.  Required second pass.  Esophagus left lateral to trachea.    External pacer  Date/Time: 02/10/2020 2:25 AM Performed by: Merryl Hacker, MD Authorized by: Merryl Hacker, MD  Consent: The procedure was performed in an emergent situation. Verbal consent obtained. Consent given by: patient Relevant documents: relevant documents present and verified Local anesthesia used: no  Anesthesia: Local anesthesia used: no  Sedation: Patient sedated: no  Comments: Patient given Versed presedation Indication complete heart block    (including critical care  time)  Medications Ordered in ED Medications  aspirin chewable tablet 324 mg ( Oral MAR Hold 02/10/20 0200)  morphine 4 MG/ML injection 4 mg ( Intravenous MAR Hold 02/10/20 0200)  aspirin chewable tablet 81 mg ( Per NG tube Automatically Held 02/18/20 1000)  atorvastatin (LIPITOR) tablet 80 mg ( Per NG tube Automatically Held 02/18/20 1800)  pantoprazole (PROTONIX) injection 40 mg ( Intravenous Automatically Held 02/18/20 2200)  acetaminophen (TYLENOL) tablet 650 mg ( Oral MAR Hold 02/10/20 0200)  fentaNYL (SUBLIMAZE) injection 50 mcg ( Intravenous MAR Hold 02/10/20 0200)  fentaNYL 2531mg in NS 2565m(1011mml) infusion-PREMIX (has no administration in time range)  fentaNYL (SUBLIMAZE) bolus via infusion 50 mcg ( Intravenous MAR Hold 02/10/20 0200)  midazolam (VERSED) injection 2 mg ( Intravenous MAR Hold 02/10/20 0200)  midazolam (VERSED) injection 2 mg ( Intravenous MAR Hold 02/10/20 0200)  QUEtiapine (SEROQUEL) tablet 50 mg ( Per Tube Automatically Held 02/18/20 2200)  gabapentin (NEURONTIN) 250 MG/5ML solution 100 mg ( Per Tube Automatically Held 02/18/20 2200)  docusate (COLACE) 50 MG/5ML liquid 100 mg ( Per Tube MAR Hold 02/10/20 0200)  insulin regular, human (MYXREDLIN) 100 units/ 100 mL infusion (has no administration in time range)  0.9 %  sodium chloride infusion (has no administration in time range)  dextrose 5 %-0.45 % sodium chloride infusion (has no administration in time range)  dextrose 50 % solution 0-50 mL ( Intravenous MAR Hold 02/10/20 0200)  thiamine (B-1) injection 100 mg (has no administration in time range)  folic acid injection 1 mg (has no administration in time range)  sodium chloride 0.9 % bolus 1,000 mL (has no administration in time range)  lidocaine (PF) (XYLOCAINE) 1 % injection (15 mLs  Given 02/10/20 0217)  sodium bicarbonate injection 100 mEq (has no administration in time range)  sodium bicarbonate injection (50 mEq Intravenous Given 02/10/20 0219)  sodium bicarbonate 150 mEq in  sterile water 1,000 mL infusion (has no administration in time range)  sodium chloride flush (NS) 0.9 % injection 3 mL (3 mLs Intravenous Given 02/10/20 0035)  ondansetron (ZOFRAN) injection 4 mg (4 mg Intravenous Given 02/10/20 0018)  heparin injection 4,000 Units (4,000 Units Intravenous Given 02/10/20 0025)  midazolam (VERSED) 5 MG/5ML injection (4 mg Intravenous Given 02/10/20 0018)  aspirin suppository 300 mg (300 mg Rectal Given 02/10/20 0047)  etomidate (AMIDATE) injection (20 mg Intravenous Given 02/10/20 0053)  succinylcholine (ANECTINE) injection (150 mg Intravenous Given 02/10/20 0053)  propofol (DIPRIVAN) 1000 MG/100ML infusion (10 mcg/kg/min  95.2 kg Intravenous Rate/Dose Change 02/10/20 0120)    ED Course  I have reviewed the triage vital signs and the nursing notes.  Pertinent labs & imaging results that were available during my care of the patient were reviewed by me and considered in my medical decision making (see chart for details).    MDM Rules/Calculators/A&P  Patient presents with chest pain.  She is very ill-appearing on initial evaluation.  Initial blood pressure reassuring.  But she appears to be include complete heart block on EKG.  She is having active chest pain.  Question whether she had a primary cardiac event.  No obvious medications that would cause heart block.  Heart rate at times as low as 20-30.  For this reason and how uncomfortable she was, patient was placed on the pads and paced externally.  Cardiology was emergently consulted.  I spoke with Dr. Radford Pax who came to the bedside.  Patient continued to look poor.  Post Versed became more somnolent.  Decision made to intubate for airway control.  Potassium was normal.  She does have acute kidney injury and a glucose of greater than 600 on initial evaluation.  Chest x-ray shows some pulmonary edema but otherwise is reassuring.  She was placed on the Endo tool for her sugar.  Initially aggressive fluid  resuscitation was held.  Request ICU consultation for primary admission.  I spoke to the evening doctor twice.  Unfortunately, ICU was unable to assess the patient at this time.  I do believe that she is best served under the care of primary ICU service.  From a cardiology perspective, plan to take her to the Cath Lab for temporary pacemaker.  She will likely need definitive cardiac catheterization as well.  2:00 AM ABG with a blood pH of 6.8.  Patient is likely in legitimate DKA with an anion gap of 30.  Patient is currently in the Cath Lab.  I reported to the Cath Lab to provide them with the results.  I also ordered a liter of fluids, 2 amp of bicarb, increase propofol for sedation as patient was moving.  Dr. Radford Pax, called E link for additional ICU support at that time.  Final Clinical Impression(s) / ED Diagnoses Final diagnoses:  NSTEMI (non-ST elevated myocardial infarction) ([place])  Complete heart block ([place])  Diabetic ketoacidosis without coma associated with type 2 diabetes mellitus [facility])    Rx / DC Orders ED Discharge Orders    None       , Barbette Hair, MD 02/10/20 0230    Merryl Hacker, MD 02/10/20 [phone]

## 2020-02-10 NOTE — Progress Notes (Signed)
Pt transported to and from CT from [place] without event.

## 2020-02-10 NOTE — Procedures (Signed)
Central Venous Catheter Insertion Procedure Note ERON SASALA [id number] Jun 14, 1971  Procedure: Insertion of Central Venous Catheter Indications: Assessment of intravascular volume, Drug and/or fluid administration and Frequent blood sampling   Procedure Details Consent: Unable to obtain consent because of emergent medical necessity.  Time Out: Verified patient identification, verified procedure, site/side was marked, verified correct patient position, special equipment/implants available, medications/allergies/relevent history reviewed, required imaging and test results available.  Performed  Maximum sterile technique was used including antiseptics, cap, gloves, gown, hand hygiene, mask and sheet. Skin prep: Chlorhexidine; local anesthetic administered A antimicrobial bonded/coated triple lumen catheter was placed in the left internal jugular vein using the Seldinger technique and verified with [place].  Evaluation Blood flow good Complications: No apparent complications Patient did tolerate procedure well. Chest X-ray ordered to verify placement.  CXR: normal.  Otilio Carpen Ashdon Gillson 02/10/2020, 5:41 AM

## 2020-02-10 NOTE — Progress Notes (Signed)
Orthopedic Tech Progress Note Patient Details:  Sarah Long 1971-07-28 [id number]  Ortho Devices Type of Ortho Device: Knee Immobilizer Ortho Device/Splint Location: rle Ortho Device/Splint Interventions: Ordered, Application, Adjustment   Post Interventions Patient Tolerated: Well Instructions Provided: Care of device, Adjustment of device   Karolee Stamps 02/10/2020, 4:06 AM

## 2020-02-10 NOTE — Progress Notes (Addendum)
PCCM interval progress note:  Pt rounded on overnight with PCCM attending, Neosynephrine titrated up to 125mcg.  Presented with complete HB, chest pain s/p clean L heart cath and temporary pacer  and found to have lactic acidosis and presumed cardiogenic shock. She was awake and following commands.  On exam, has obvious upper abdominal pain. Continues on insulin gtt and received 2.5L IVF on admission yesterday.   CT abdomen/pelvis obtained which shows acute pancreatitis without drainable fluid collection or abscess. LFT's remain elevated but are improving, normal bilirubin, lactic acid has cleared.     Plan: -Echo with EF of 40-45% with global LV hypokineses, will bolus an additional 500c LR only then 75cc/hr, repeat POCUS [place] appears unchanged -Check Mag, phos, lipase, triglycerides (on Latuda which could increase) and blood cultures -If patient shows signs of worsening sepsis such as worsening hypotension and fever then consider starting Zosyn -ETOH level normal on admission, consider biliary etiology of pancreatitis as GB with sludge, consider GI consult for HIDA scan   CRITICAL CARE Performed by: Otilio Carpen Keerthi Hazell   Total critical care time: 40 minutes  Critical care time was exclusive of separately billable procedures and treating other patients.  Critical care was necessary to treat or prevent imminent or life-threatening deterioration.  Critical care was time spent personally by me on the following activities: development of treatment plan with patient and/or surrogate as well as nursing, discussions with consultants, evaluation of patient's response to treatment, examination of patient, obtaining history from patient or surrogate, ordering and performing treatments and interventions, ordering and review of laboratory studies, ordering and review of radiographic studies, pulse oximetry and re-evaluation of patient's condition.   Otilio Carpen Jadalee Westcott, PA-C

## 2020-02-10 NOTE — Procedures (Signed)
Arterial Catheter Insertion Procedure Note POPPIE MAGNANO [id number] [birth year]  Procedure: Insertion of Arterial Catheter  Indications: Blood pressure monitoring  Procedure Details Consent: Unable to obtain consent because of emergent medical necessity. Time Out: Verified patient identification, verified procedure, site/side was marked, verified correct patient position, special equipment/implants available, medications/allergies/relevent history reviewed, required imaging and test results available.  Performed  Maximum sterile technique was used including antiseptics, cap, gloves, gown, hand hygiene, mask and sheet. Skin prep: Chlorhexidine;  20 gauge catheter was inserted into left radial artery using the Seldinger technique. ULTRASOUND GUIDANCE USED: NO Evaluation Blood flow good; BP tracing good. Complications: No apparent complications.  Aline performed per MD order. No complications. Initial BP of 112/64.   Irineo Axon [facility] 02/10/2020

## 2020-02-10 NOTE — Progress Notes (Signed)
PCCM:  49 year old, complete heart block, left heart cath clear coronaries, TVP in place, no P waves, ventricularly paced.  Echo pending.  Patient seen and evaluated at bedside ICU. BP (!) 88/75   Pulse 80   Temp 97.7 F (36.5 C) (Oral)   Resp 19   Ht 5\' 4"  (1.626 m)   Wt 95.5 kg   LMP 02/07/2020 Comment: negative preg test  SpO2 100%   BMI 36.14 kg/m  Enteral: Intubated on mechanical ventilation critically ill Neck: Left IJ central line, endotracheal tube in place Heart: Regular paced rhythm Lungs: Bilateral ventilated breath sounds Abdomen: Soft nontender nondistended  Labs: Reviewed Chest x-ray: Reviewed Echo pending  Assessment: Complete heart block -Unclear etiology, questionable overdose -Coronaries clear on catheterization Cardiogenic shock  Plan: Case discussed with cardiology. Vasopressors to maintain mean arterial pressure in 65 Initiate high-dose insulin therapy Vasopressors initiated  Unclear etiology for complete heart block.  At this point with no history unable to reach family and progressive shocklike state can consider overdose as a possibility.  We will start high-dose insulin therapy and ensure coverage for appropriate glucose monitoring.  This patient is critically ill with multiple organ system failure; which, requires frequent high complexity decision making, assessment, support, evaluation, and titration of therapies. This was completed through the application of advanced monitoring technologies and extensive interpretation of multiple databases. During this encounter critical care time was devoted to patient care services described in this note for 33 minutes.  Sarah Nash, DO [facility] 02/10/2020 8:41 AM

## 2020-02-10 NOTE — Consult Note (Signed)
Admit date: 02/09/2020 Referring Physician  Dr. Thayer Jew Primary Physician  none Primary Cardiologist  None Reason for Consultation  Complete heart block and chest pain  HPI: Sarah Long is a 49 y.o. female who is being seen today for the evaluation of complete heart block and chest pain at the request of Thayer Jew, MD.  This is a 49yo morbidly obese AAF with a hx of poorly controlled DM (unable to afford meds), HTN, HLD and ongoing tobacco abuse.  History is obtained from the chart as well as Dr. Dina Rich as the patient is in respiratory distress and lethargic from Versed.  Patient apparently was in her USOH until 4 hours ago when she started having severe 10/10 chest pain across her chest.  This was associated with severe SOB.  She was apparently nauseated today and had been vomiting.  She was dizzy and felt like she was going to pass out but no documented syncope.    On arrival in the ER was found to be in complete heart block with ventricular escape in the 30's.  She was started on external pacing.  Developed more respiratory distress and at the time of my examination is being intubated.  Labs included BS> 700, Na 123, K+ 4.9, Creatinine 2.9 and Hbg 13.6.  EKG showed NSR with complete heart block and ventricular escape.  Patient is being admitted to [facility] and Cardiology is now asked to consult for management of CHB and chest pain.    PMH:   Past Medical History:  Diagnosis Date  . Abnormal Pap smear 1998  . Abnormal vaginal bleeding 12/19/2011  . Bacterial infection   . Bipolar 1 disorder ([place])   . Blister of second toe of left foot 11/21/2016  . Candida vaginitis 07/2007  . Depression    recently added wellbutrin-has not taken yet for bipolar  . Diabetes in pregnancy   . Diabetes mellitus    nph 20U qam and qpm, regular with meals  . Fibroid   . Galactorrhea of right breast 2008  . H/O amenorrhea 07/2007  . H/O dizziness 10/14/11  . H/O dysmenorrhea 2010  . H/O  menorrhagia 10/14/11  . H/O varicella   . Headache(784.0)   . Heavy vaginal bleeding due to contraceptive injection use 10/12/2011   Depo Provera  . Herpes   . HSV-2 infection 01/03/2009  . Hx: UTI (urinary tract infection) 2009  . Hypertension    on aldomet  . Increased BMI 2010  . Irregular uterine bleeding 04/04/2012   Pt has mirena   . Obesity 10/14/11  . Oligomenorrhea 07/2007  . Pelvic pain in female   . Preterm labor   . Trichomonas   . Yeast infection      PSH:   Past Surgical History:  Procedure Laterality Date  . CESAREAN SECTION  1991    Allergies:  Strawberry extract and Sulfa antibiotics Prior to Admit Meds:  (Not in a hospital admission)  Fam HX:    Family History  Problem Relation Age of Onset  . Hypertension Mother   . Diabetes Mother   . Heart disease Mother   . Hypertension Father   . Diabetes Father   . Heart disease Father   . Stroke Maternal Grandfather   . Other Neg Hx    Social HX:    Social History   Socioeconomic History  . Marital status: Married    Spouse name: Not on file  . Number of children: Not on file  .  Years of education: Not on file  . Highest education level: Not on file  Occupational History  . Not on file  Tobacco Use  . Smoking status: Current Every Day Smoker    Packs/day: 0.50    Years: 20.00    Pack years: 10.00    Types: Cigarettes  . Smokeless tobacco: Never Used  Substance and Sexual Activity  . Alcohol use: No  . Drug use: No  . Sexual activity: Yes    Birth control/protection: None  Other Topics Concern  . Not on file  Social History Narrative  . Not on file   Social Determinants of Health   Financial Resource Strain:   . Difficulty of Paying Living Expenses: Not on file  Food Insecurity:   . Worried About Charity fundraiser in the Last Year: Not on file  . Ran Out of Food in the Last Year: Not on file  Transportation Needs:   . Lack of Transportation (Medical): Not on file  . Lack of  Transportation (Non-Medical): Not on file  Physical Activity:   . Days of Exercise per Week: Not on file  . Minutes of Exercise per Session: Not on file  Stress:   . Feeling of Stress : Not on file  Social Connections:   . Frequency of Communication with Friends and Family: Not on file  . Frequency of Social Gatherings with Friends and Family: Not on file  . Attends Religious Services: Not on file  . Active Member of Clubs or Organizations: Not on file  . Attends Archivist Meetings: Not on file  . Marital Status: Not on file  Intimate Partner Violence:   . Fear of Current or Ex-Partner: Not on file  . Emotionally Abused: Not on file  . Physically Abused: Not on file  . Sexually Abused: Not on file     ROS:  All  ROS were addressed and are negative except what is stated in the HPI  Physical Exam: Blood pressure (!) 179/156, pulse (!) 41, resp. rate (!) 27, height 5\' 4"  (1.626 m), weight 95.2 kg, last menstrual period 02/07/2020, SpO2 94 %.    General: obese, well developed in acute respiratory distress using accessory muscles of respiration Head: Eyes PERRLA, No xanthomas.   Normal cephalic and atramatic  Lungs:  Decrease BS throughout Heart:   HRRR S1 S2 Pulses are 2+ & equal.            No carotid bruit. No JVD.  No abdominal bruits. No femoral bruits. Abdomen: Bowel sounds are positive, abdomen soft and non-tender without masses or                  Hernia's noted. Msk:  Back normal Extremities:   No clubbing, cyanosis or edema.  DP +1 Neuro: Alert and oriented X 3 but very lethargic Psych:  Cannot assess  Labs:   Lab Results  Component Value Date   WBC 13.0 (H) 02/10/2020   HGB 13.6 02/10/2020   HCT 40.0 02/10/2020   MCV 103.8 (H) 02/10/2020   PLT 255 02/10/2020    Recent Labs  Lab 02/10/20 0039  NA 123*  K 4.9  CL 93*  BUN 29*  CREATININE 2.90*  GLUCOSE >700*   No results found for: PTT No results found for: INR, PROTIME No results found for:  CKTOTAL, CKMB, CKMBINDEX, TROPONINI   Lab Results  Component Value Date   CHOL 120 03/17/2017   CHOL 171 10/21/2016  CHOL 165 12/22/2015   Lab Results  Component Value Date   HDL 54 03/17/2017   HDL 62 10/21/2016   HDL 57 12/22/2015   Lab Results  Component Value Date   LDLCALC 30 03/17/2017   LDLCALC 86 10/21/2016   LDLCALC 90 12/22/2015   Lab Results  Component Value Date   TRIG 181 (H) 03/17/2017   TRIG 113 10/21/2016   TRIG 90 12/22/2015   Lab Results  Component Value Date   CHOLHDL 2.2 03/17/2017   CHOLHDL 2.8 10/21/2016   CHOLHDL 2.9 12/22/2015   No results found for: LDLDIRECT    Radiology:  DG Chest Portable 1 View  Result Date: 02/10/2020 CLINICAL DATA:  Shortness of breath. Vomiting. EXAM: PORTABLE CHEST 1 VIEW COMPARISON:  None. FINDINGS: Low lung volumes. Mild cardiomegaly. Interstitial prominence suggest pulmonary edema. No large pleural effusion. No pneumothorax. Overlying monitoring devices project over the chest. IMPRESSION: Mild cardiomegaly. Interstitial prominence suggest pulmonary edema. Low lung volumes. Electronically Signed   By: Keith Rake M.D.   On: 02/10/2020 00:33     Telemetry    NSR with complete heart block and slow ventricular escape - Personally Reviewed  ECG    NSR with complete heart block and slow ventricular escape - Personally Reviewed   ASSESSMENT/PLAN:    1.  Complete heart block -? Etiology -concern for ischemic origin given hx of severe CP for the past 4 hours -pain improved some with external pacing -K+ 4.9 -2D echo 06/2019 showed normal LVF with severe LVH -she is not on any AVN blocking agents -pacer turned down to assess underlying rhythm which remains complete heart block with ventricular escape at 25-30bpm -will take emergently to cath lab for temp pacing wire -check TSH  2.  Chest pain -started about 4 hours ago with associated SOB and N/V -she has multiple CRFs including poorly controlled DM, HLD,  ongoing tobacco use and fm hx of CAD -EKG shows ventricular escape rhythm with LBBB morphology -hsTrop 700 -creatinine elevated likely secondary to dehydration in the setting of severe hyperglycemia (last creatinine 0.92 in Aug 2020) -will discuss emergent heart cath with on call interventional Cards - increased risk in setting of AKI -start ASA 81mg  per NGT and high dose statin with Lipitor 80mg  daily -no BB due to CHB -check 2D echo to assess LVF  3.  HTN -BP on arrival was 133/59mmHg and has been as high as 186/178mmHg - currently 141/31mmHg -BP improved after intubation and propofol -no BB in setting of complete heart block -use IV Hydralazine PRN as needed  4.  Hyperglycemia -hx of poorly controlled DM with HbA1Cs in the 11-12% range since 2018 -has problems affording Insulin -BS>700 -Hyponatremic -CCM to manage  5.  Acute Respiratory Distress -secondary to mild interstitial edema, lethargy -s/p intubation in the ER -mechanical ventilation per CCM -admitting team  6.  AKI -creatinine was 0.91 in Aug 2020 and now 2.9 likely related to dehydration from glucosuria in setting of severe hyperglycemia -per CCM  The patient is critically ill with multiple organ systems failure and requires high complexity decision making for assessment and support, frequent evaluation and titration of therapies, application of advanced monitoring technologies and extensive interpretation of multiple databases. Critical Care Time devoted to patient care services described in this note independent of APP time is 60 minutes with >50% of time spent in direct patient care.     Fransico Him, MD  02/10/2020  12:49 AM

## 2020-02-10 NOTE — Progress Notes (Addendum)
Progress Note  Patient Name: Sarah Long Date of Encounter: 02/10/2020  Primary Cardiologist: Fransico Him, MD   Subjective   Unresponsive despite no sedation. Mechanically ventilated, MV >12 l/min, FiO2 0.5, 97% sat. Hypotensive: 80/50, V paced 80 bpm. No underlying rhythm when pacing is reduced to 40 bpm (severe sinus bradycardia with complete heart block, no native ventricular complexes are seen). Still with marked metabolic acidosis and hyperkalemia. Quick review of initial bedside echo images shows severe LVH, mildly reduced LVEF 45%, RV dilated with reduced function, no major valvular abnormalities, no pericardial effusion.  Inpatient Medications    Scheduled Meds: . aspirin  324 mg Oral Once  . aspirin  81 mg Per NG tube Daily  . atorvastatin  80 mg Per NG tube q1800  . chlorhexidine gluconate (MEDLINE KIT)  15 mL Mouth Rinse BID  . Chlorhexidine Gluconate Cloth  6 each Topical Daily  . folic acid  1 mg Intravenous Daily  . gabapentin  100 mg Per Tube Q8H  . mouth rinse  15 mL Mouth Rinse 10 times per day  . pantoprazole (PROTONIX) IV  40 mg Intravenous QHS  . sodium chloride flush  3 mL Intravenous Q12H  . thiamine injection  100 mg Intravenous Q24H   Continuous Infusions: . sodium chloride 75 mL/hr at 02/10/20 0740  . sodium chloride    . sodium chloride    . dextrose 5 % and 0.45% NaCl    . fentaNYL infusion INTRAVENOUS 50 mcg/hr (02/10/20 0741)  . heparin    . insulin 12 Units/hr (02/10/20 0627)  . phenylephrine (NEO-SYNEPHRINE) Adult infusion    . sodium chloride     PRN Meds: sodium chloride, Place/Maintain arterial line **AND** sodium chloride, acetaminophen, dextrose, docusate, fentaNYL, midazolam, midazolam, sodium chloride flush   Vital Signs    Vitals:   02/10/20 0615 02/10/20 0630 02/10/20 0700 02/10/20 0806  BP:  (!) 79/60 (!) 88/75   Pulse:      Resp: (!) 28 (!) 28 15   Temp:    97.7 F (36.5 C)  TempSrc:    Oral  SpO2:      Weight:        Height:        Intake/Output Summary (Last 24 hours) at 02/10/2020 0817 Last data filed at 02/10/2020 0700 Gross per 24 hour  Intake 2216.64 ml  Output --  Net 2216.64 ml   Last 3 Weights 02/10/2020 02/10/2020 10/02/2019  Weight (lbs) 210 lb 8.6 oz 209 lb 14.1 oz 209 lb 12.8 oz  Weight (kg) 95.5 kg 95.2 kg 95.165 kg      Telemetry    V paced - Personally Reviewed  ECG    Complete AV block with V escape 30 - Personally Reviewed  Physical Exam  Morbidly obese GEN: intubated, nonspecific response to mechanical stimuli only.   Neck: unable to evaluate JVD Cardiac: RRR, no murmurs, rubs, or gallops.  Respiratory: Clear to auscultation bilaterally. GI: Soft, nontender, non-distended  MS: No edema; No deformity. Neuro:  began to open eyes and follow commands after starting pressors and BP increased Psych: n/a  Labs    High Sensitivity Troponin:   Recent Labs  Lab 02/10/20 0002 02/10/20 0133 02/10/20 0344  TROPONINIHS 730* 1,513* 2,024*      Chemistry Recent Labs  Lab 02/10/20 0002 02/10/20 0002 02/10/20 0039 02/10/20 0145 02/10/20 0344 02/10/20 0553 02/10/20 0700  NA 126*   < > 123*   < > 131* 131* 129*  K 5.1   < > 4.9   < > 5.9* 5.3* 5.9*  CL 87*   < > 93*  --  91*  --  95*  CO2 8*  --   --   --  10*  --  16*  GLUCOSE 893*   < > >700*  --  783*  --  538*  BUN 27*   < > 29*  --  28*  --  29*  CREATININE 3.04*   < > 2.90*  --  2.85*  --  2.78*  CALCIUM 8.1*  --   --   --  7.9*  --  6.7*  PROT  --   --   --   --   --   --  5.5*  ALBUMIN  --   --   --   --   --   --  2.6*  AST  --   --   --   --   --   --  736*  ALT  --   --   --   --   --   --  275*  ALKPHOS  --   --   --   --   --   --  68  BILITOT  --   --   --   --   --   --  1.0  GFRNONAA 18*  --   --   --  19*  --  20*  GFRAA 20*  --   --   --  22*  --  23*  ANIONGAP 31*  --   --   --  30*  --  18*   < > = values in this interval not displayed.     Hematology Recent Labs  Lab 02/10/20 0002  02/10/20 0039 02/10/20 0145 02/10/20 0344 02/10/20 0553  WBC 13.0*  --   --  14.2*  --   RBC 3.95  --   --  3.91  --   HGB 11.8*   < > 12.6 11.7* 10.5*  HCT 41.0   < > 37.0 38.2 31.0*  MCV 103.8*  --   --  97.7  --   MCH 29.9  --   --  29.9  --   MCHC 28.8*  --   --  30.6  --   RDW 13.7  --   --  13.7  --   PLT 255  --   --  207  --    < > = values in this interval not displayed.    BNPNo results for input(s): BNP, PROBNP in the last 168 hours.   DDimer No results for input(s): DDIMER in the last 168 hours.   Radiology    DG Abd 1 View  Result Date: 02/10/2020 CLINICAL DATA:  Bedside NG/OG tube placement. EXAM: Portable ABDOMEN-1 VIEW 7:10 a.m.: COMPARISON:  Portable abdomen x-ray earlier same day at 5:11 a.m. FINDINGS: Nasogastric tube tip at the EG junction. The tube should be advanced several centimeters. Mild gaseous distention of the stomach. IMPRESSION: 1. Nasogastric tube tip at the EG junction. The tube should be advanced several centimeters. 2. Mild gaseous distention of the stomach. These results will be called to the ordering clinician or representative by the Radiologist Assistant, and communication documented in the PACS or zVision Dashboard. Electronically Signed   By: Evangeline Dakin M.D.   On: 02/10/2020 07:58   CARDIAC CATHETERIZATION  Result Date: 02/10/2020 Diabetic ketoacidosis with  complete heart block with ventricular escape rhythm. Successful insertion of a temporary venous pacemaker advanced to the RV apex with excellent capture.  Currently set at 80 bpm, and a 5. Normal coronary arteries. LVEDP 32 mmHg RECOMMENDATION: The patient will be admitted by the critical care medicine team.  Plan echo Doppler in a.m. to assess LV systolic and diastolic function.  Further management of the patient's DKA will be addressed by CCM.  DG Chest Port 1 View  Addendum Date: 02/10/2020   ADDENDUM REPORT: 02/10/2020 05:28 ADDENDUM: Upon further review, there is tubing coiled in  the cervical region, presumably an enteric tube coiled in the pharynx. These results will be called to the ordering clinician or representative by the Radiologist Assistant, and communication documented in the PACS or zVision Dashboard. Electronically Signed   By: Vinnie Langton M.D.   On: 02/10/2020 05:28   Result Date: 02/10/2020 CLINICAL DATA:  49 year old female status post intubation. EXAM: PORTABLE CHEST 1 VIEW COMPARISON:  Chest x-ray 02/10/2020. FINDINGS: An endotracheal tube is in place with tip 3.2 cm above the carina. There is a left-sided internal jugular central venous catheter with tip terminating in the distal superior vena cava. Lung volumes are low. Ill-defined opacity in the central aspect of the right lung, likely in the right lower lobe concerning for airspace consolidation. No definite pleural effusions. No evidence of pulmonary edema. Heart size is upper limits of normal. Upper mediastinal contours are unremarkable. Transcutaneous defibrillator pad projecting over the left hemithorax. IMPRESSION: 1. Support apparatus, as above. 2. Extensive airspace consolidation in the right lower lobe concerning for pneumonia. Electronically Signed: By: Vinnie Langton M.D. On: 02/10/2020 05:24   DG Chest Portable 1 View  Result Date: 02/10/2020 CLINICAL DATA:  Intubation EXAM: PORTABLE CHEST 1 VIEW COMPARISON:  In February 10, 2020 FINDINGS: The endotracheal tube terminates above the carina by approximately 2.4 cm. The prominent interstitial lung markings are again noted. There is cardiomegaly pneumothorax. There may be small bilateral pleural effusions. There is no acute osseous abnormality. IMPRESSION: Endotracheal tube terminates above the carina by approximately 2.4 cm. Cardiomegaly with prominent interstitial lung markings which may represent pulmonary edema or an atypical infectious process. Electronically Signed   By: Constance Holster M.D.   On: 02/10/2020 01:16   DG Chest Portable 1  View  Result Date: 02/10/2020 CLINICAL DATA:  Shortness of breath. Vomiting. EXAM: PORTABLE CHEST 1 VIEW COMPARISON:  None. FINDINGS: Low lung volumes. Mild cardiomegaly. Interstitial prominence suggest pulmonary edema. No large pleural effusion. No pneumothorax. Overlying monitoring devices project over the chest. IMPRESSION: Mild cardiomegaly. Interstitial prominence suggest pulmonary edema. Low lung volumes. Electronically Signed   By: Keith Rake M.D.   On: 02/10/2020 00:33   DG Abd Portable 1V  Result Date: 02/10/2020 CLINICAL DATA:  49 year old female status post orogastric tube placement. EXAM: PORTABLE ABDOMEN - 1 VIEW COMPARISON:  Abdominal radiograph 05/20/2016. FINDINGS: No enteric tube identified. Gaseous distension of the stomach in the visualized portions of the abdomen. Lower abdomen was incompletely imaged. Transcutaneous defibrillator pad seen projecting over the left hemithorax and left upper abdomen. Central venous catheter projecting over the distal superior vena cava. IMPRESSION: 1. No enteric tube noted. Electronically Signed   By: Vinnie Langton M.D.   On: 02/10/2020 05:26    Cardiac Studies   Echo - see above, full study pending  Normal coronaries at cath. LVEDP 32.  Patient Profile     49 y.o. female presenting with prolonged chest pain, complete heart block,  DKA/lactic acidosis, initially hypertensive. Quickly decompensated and required intubation. Received TPW, normal coronaries by cath. Subsequently developed hypotension/shock. Remains pacemaker dependent despite improving metabolic parameters.  Assessment & Plan    1. CHB: suspect due to metabolic abnormalities, although would have expected some improvement by now. No evidence of CAD and primary conduction abnormality would be surprising at her age. Drugs considered, but her home meds did not include beta blockers, centrally acting calcium channel blockers. Consider clonidine effect (should be short-lived). 2.  CHF:  High likelihood of significant diastolic dysfunction, but anticipate normalization of systolic function with correction of the acidosis.  3. Shock: probably initially hypovolemic due to hyperglycemia, but cannot exclude sepsis. Further compounded by prolonged tissue hypoperfusion due to severe bradycardia and lactic acidosis (improving). Elevated EDP at cath in setting of prolonged severe bradycardia. Pulmonary embolism considered, but oxygenating well. 4. ARF: we may see renal function get worse before it improves, high likelihood of ATN. Low dose of contrast used at cath. 5. Shock liver: underlying steatosis. 6. Acute respiratory failure: on vent. Oxygenation is fair. May largely be severe muscle fatigue due to prolonged metabolic acidosis and hyperventilation, plus obesity. Can allow more IV fluids if needed, at least until hemodynamically and metabolically stabilized.     CRITICAL CARE Performed by: Dani Gobble Zakyah Yanes   Total critical care time: 65 minutes  Critical care time was exclusive of separately billable procedures and treating other patients.  Critical care was necessary to treat or prevent imminent or life-threatening deterioration.  Critical care was time spent personally by me on the following activities: development of treatment plan with patient and/or surrogate as well as nursing, discussions with consultants, evaluation of patient's response to treatment, examination of patient, obtaining history from patient or surrogate, ordering and performing treatments and interventions, ordering and review of laboratory studies, ordering and review of radiographic studies, pulse oximetry and re-evaluation of patient's condition.  Discussed in detail with Dr. Valeta Harms, PCCM.   For questions or updates, please contact [facility] Please consult [email or web address] for contact info under        Signed, Sanda Klein, MD  02/10/2020, 8:17 AM

## 2020-02-10 NOTE — H&P (Addendum)
..   NAME:  Sarah Long, MRN:  [id number], DOB:  12-Jan-1971, LOS: 0 ADMISSION DATE:  02/09/2020, CONSULTATION DATE:  02/10/2020 REFERRING MD:  Dina Rich MD, CHIEF COMPLAINT:  CHEST PAIN   Brief History   49 yr old F w/ PMHx HTN and DM presents in 10/10 chest pain with elevated troponin 730 and in complete heart block. BG 893 mg/dl with AG 31. Seen by Cardiology and taken for LHC and TVP.  ED Provider Horton MD asked PCCM to admit.  History of present illness   (History obtained from EMR and acct of other providers due to pt being intubated)  49 yr old obese F w/ significant PMHx HTN, HLD and IDDM presented to [facility] with 10/10 chest pain. Per the triage note pt reported vomiting all day, being short of breath and having severe chest pain feeling like she was going to pass out. She denied loss of consciousness or syncopal episode.   Her initial Vitals were 133/31mHg HR 41 RR 26 O2 sat 94% . Her CBG at 2358 was >'600mg'$ /dl  EKG showed an  Idioventricular rhythm with Left axis deviation conduction block, LVH and an inferior infarct (QRS 162 ms and QT/QTc 574/467 ms).  Troponin 730.   Then at 0013 pt went into complete heart block. BP 97/61 HR 39 bpm Received  Versed 4 mg at 0018  And a heparin gtt was started then pt was externally paced at 0020. BP improved. Per documentation pt became somnolent post versed and required endotracheally intubation 0046 and started on external pacing. Cardiology evaluated pt took to the Cath lab for LHC and TVP. Past Medical History  ..Marland KitchenActive Ambulatory Problems    Diagnosis Date Noted  . Hypercholesteremia 02/02/2007  . Bipolar 1 disorder ([place] 02/02/2007  . HYPERTENSION, BENIGN SYSTEMIC 02/02/2007  . Smoking 11/26/2013  . Diabetes type 2, uncontrolled ([place] 10/21/2016  . 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 block) 05/29/2019  . Bilateral bunions 05/29/2019  . Chronic right shoulder pain 05/29/2019  . DM type 2 with diabetic peripheral  neuropathy ([place] 05/30/2019  . Dysmenorrhea 10/02/2019   Resolved Ambulatory Problems    Diagnosis Date Noted  . Diabetes ([place] 02/02/2007  . Threatened preterm labor 06/21/2011  . Diabetes mellitus complicating pregnancy, childbirth, or the puerperium 07/21/2011  . Irregular uterine bleeding 04/04/2012  . Vaginal yeast infection 10/21/2016  . Swelling of left ring finger 10/21/2016  . Blister of second toe of left foot 11/21/2016  . Sudden idiopathic hearing loss of left ear with unrestricted hearing of right ear 01/06/2018  . Holosystolic murmur 048/88/9169  Past Medical History:  Diagnosis Date  . Abnormal Pap smear 1998  . Abnormal vaginal bleeding 12/19/2011  . Bacterial infection   . Candida vaginitis 07/2007  . Depression   . Diabetes in pregnancy   . Diabetes mellitus   . Fibroid   . Galactorrhea of right breast 2008  . H/O amenorrhea 07/2007  . H/O dizziness 10/14/11  . H/O dysmenorrhea 2010  . H/O menorrhagia 10/14/11  . H/O varicella   . Headache(784.0)   . Heavy vaginal bleeding due to contraceptive injection use 10/12/2011  . Herpes   . HSV-2 infection 01/03/2009  . Hx: UTI (urinary tract infection) 2009  . Hypertension   . Increased BMI 2010  . Obesity 10/14/11  . Oligomenorrhea 07/2007  . Pelvic pain in female   . Preterm labor   . Trichomonas   . Yeast infection  Significant Hospital Events   Complete Heart block--> Cardiology evaluated --> going to Cath Lab  Consults:  02/10/2020>> Cardiology 02/10/2020>> PCCM  Procedures:  02/10/2020>>Endotracheal intubation in ED  Significant Diagnostic Tests:  ABG: 6.87/34/167/6.4  CXR 02/10/2020 FINDINGS: The endotracheal tube terminates above the carina by approximately 2.4 cm. The prominent interstitial lung markings are again noted. There is cardiomegaly pneumothorax. There may be small bilateral pleural effusions. There is no acute osseous abnormality.  IMPRESSION: Endotracheal tube terminates above the  carina by approximately 2.4 cm.Cardiomegaly with prominent interstitial lung markings which may represent pulmonary edema or an atypical infectious process.  LABS: Na 126 K= 5.1 Cl 87 Bicarb 8  Blood Glucose 893 mg/dl BUN 27 Cr 3.04  Troponin 730 WBC 13 Hgb 11.8 MCV 103.8 Plts 255  EKG on Presentation:  Vent. rate 39 BPM PR interval * ms QRS duration 166 ms QT/QTc 592/476 ms P-R-T axes 85 -76 73 Sinus tachycardia with complete heart block and Idioventricular rhythm Left axis deviation Non-specific intra-ventricular conduction block Left ventricular hypertrophy ( Cornell product , Romhilt-Estes ) Possible Lateral infarct , age undetermined Abnormal ECG  Last EKG prior to Cath Lab Vent. rate 62 BPM QRS duration 166 ms QT/QTc 580/590 ms P-R-T axes * 105 -72 Consider left ventricular hypertrophy Repol abnrm, severe global ischemia (LM/MVD) Prolonged QT interval Baseline wander in lead(s) II III aVR aVL aVF V2 V3 V4 V5 V6  ECHO 06/13/2019 1. The left ventricle has normal systolic function with an ejection  fraction of 60-65%. The cavity size was normal. There is severe concentric  left ventricular hypertrophy. Left ventricular diastolic Doppler  parameters are consistent with impaired  relaxation. Elevated mean left atrial pressure No evidence of left  ventricular regional wall motion abnormalities.  2. The right ventricle has normal systolic function. The cavity was  normal. There is no increase in right ventricular wall thickness. Right  ventricular systolic pressure could not be assessed.  3. Trivial pericardial effusion is present.  4. The tricuspid valve is grossly normal.  5. The aortic valve is grossly normal.  Micro Data:  No cultures sent in the ED Antimicrobials:  None given at time of evaluation   Objective   Blood pressure (!) 141/71, pulse (!) 41, temperature (!) 95.3 F (35.2 C), temperature source Rectal, resp. rate (!) 46, height '5\' 4"'$  (1.626 m), weight 95.2  kg, last menstrual period 02/07/2020, SpO2 98 %.    Vent Mode: PRVC FiO2 (%):  [100 %] 100 % Set Rate:  [16 bmp] 16 bmp Vt Set:  [430 mL] 430 mL PEEP:  [5 cmH20] 5 cmH20 Plateau Pressure:  [23 cmH20] 23 cmH20  No intake or output data in the 24 hours ending 02/10/20 0159 Filed Weights   02/10/20 0000  Weight: 95.2 kg    Examination: General: obese female endotracheally intubated HENT:  with ETT in oropharynx, OGT, right eye swollen and skin of eyelid black Lungs: vent assoc breath sounds b/l w/ + crackles Cardiovascular: S1 and S2 appreciated  Abdomen: soft obese abdomen distended + BS Extremities: no lower ext edema  Neuro: sedated  Skin: grossly intact, no skin breakdown per RN   Assessment & Plan:  1. Acute Coronary Syndrome - Complete Heart Block Elevated Troponin on presentation H/o HTN, HLD and tobacco use ( DDx ischemic cardiomyopathy vs NSTEIMI vs Takotsubo) Evaluated by Cardiology -> going to Cath lab for LHC to evaluate for ischemic lesion  and TVP Was started on Heparin gtt in ED Mgmt per Cardiology  Trend troponins Continue on Cardiac monitoring  Will need ECHO to assess LVEF   2.Anion Gap Metabolic Acidosis AG 31  BG 893 mg/dl on presentation Beta-hydroxybutyric acid was only 0.82  Most likely multiple sources for AG Ph 6.877 on ABG after being intubated for  Somnolence secondary to versed No Lactate level; prior to eval in the setting of ACS No UDS no salicylate level and no Serum Osm Plan: 2L NS bolus- Insulin gtt per protocol BMP/ VBG Q 4 Starting on Bicarb gtt for ph <7.2 Check other sources of AG:  lactate, salicylate level, S osm  Increase MVE on vent to compensate Must correct acidosis first prior to diuresis  3. Acute Respiratory Insufficiency  Following Versed 4 mg  Pt intubated in ED following Versed given prior to ext pacing Most likely was Kussmaul breathing prior  Now under ventilated and not compensating acidosis CXR shows  cardiomegaly and Fluid overload Plan: Continue on full MV support TV 8 cc/kg RR 30 PEEP 5 FiO2 80% VAPrecautions Will need diuresis once AG closes  4. Pseudohyponatremia and Pseudohyperkalemia Secondary to hyperosmolarity from elevated glucose Sodium corrected for BG  893--> 139 mEq/L Plan: Continue with DKA treatment protocol When K+ reaches 4.0 will add potassium to IVF  5. Acute Kidney Injury Pt has a h/o DM  Combination of pre -renal + diabetic nephropathy Plan: Avoid nephrotoxic medications Continue on IVF per DKA protocol F/u UA  Insert Foley catheter - critically ill pt Monitor UOP  6. Macrocytosis on Labs MCV 103 May be secondary to volume shift in hyperosmolarity No prior h/o ETOH documented Plan: Check UDS, ETOH level Continue on Thiamine and Folic  Orders were placed at [place] to Clinical Pharmacist. Best practice:  Diet: NPO until AG closes and BG normalizes Pain/Anxiety/Delirium protocol (if indicated): per protocol VAP protocol (if indicated): yes DVT prophylaxis: Heparin GI prophylaxis: PPI Glucose control: Insulin gtt for DKA Mobility: Bedrest Code Status: FULL Family Communication: a significant other is listed on chart. Attempted to call phone answered but no one responded. SW/CM consult Disposition: ICU post CATH LAB  Labs   CBC: Recent Labs  Lab 02/10/20 0002 02/10/20 0039 02/10/20 0145  WBC 13.0*  --   --   HGB 11.8* 13.6 12.6  HCT 41.0 40.0 37.0  MCV 103.8*  --   --   PLT 255  --   --     Basic Metabolic Panel: Recent Labs  Lab 02/10/20 0002 02/10/20 0039 02/10/20 0145  NA 126* 123* 120*  K 5.1 4.9 5.5*  CL 87* 93*  --   CO2 8*  --   --   GLUCOSE 893* >700*  --   BUN 27* 29*  --   CREATININE 3.04* 2.90*  --   CALCIUM 8.1*  --   --    GFR: Estimated Creatinine Clearance: 27.1 mL/min (A) (by C-G formula based on SCr of 2.9 mg/dL (H)). Recent Labs  Lab 02/10/20 0002  WBC 13.0*    Liver Function Tests: No  results for input(s): AST, ALT, ALKPHOS, BILITOT, PROT, ALBUMIN in the last 168 hours. No results for input(s): LIPASE, AMYLASE in the last 168 hours. No results for input(s): AMMONIA in the last 168 hours.  ABG    Component Value Date/Time   HCO3 6.4 (L) 02/10/2020 0145   TCO2 7 (L) 02/10/2020 0145   ACIDBASEDEF 26.0 (H) 02/10/2020 0145   O2SAT 98.0 02/10/2020 0145     Coagulation Profile: No results for input(s): INR,  PROTIME in the last 168 hours.  Cardiac Enzymes: No results for input(s): CKTOTAL, CKMB, CKMBINDEX, TROPONINI in the last 168 hours.  HbA1C: Hemoglobin A1C  Date/Time Value Ref Range Status  06/18/2019 04:18 PM 10.2 (A) 4.0 - 5.6 % Final  10/17/2018 03:46 PM 11.0 (A) 4.0 - 5.6 % Final   Hgb A1c MFr Bld  Date/Time Value Ref Range Status  01/14/2009 10:35 PM (H) 4.6 - 6.1 % Final   9.6 (NOTE)   The ADA recommends the following therapeutic goal for glycemic   control related to Hgb A1C measurement:   Goal of Therapy:   < 7.0% Hgb A1C   Reference: American Diabetes Association: Clinical Practice   Recommendations 2008, Diabetes Care,  2008, 31:(Suppl 1).    CBG: Recent Labs  Lab 02/09/20 2358  GLUCAP >600*    Review of Systems:   Marland KitchenMarland KitchenReview of Systems  Unable to perform ROS: Intubated     Past Medical History  She,  has a past medical history of Abnormal Pap smear (1998), Abnormal vaginal bleeding (12/19/2011), Bacterial infection, Bipolar 1 disorder ([place]), Blister of second toe of left foot (11/21/2016), Candida vaginitis (07/2007), Depression, Diabetes in pregnancy, Diabetes mellitus, Fibroid, Galactorrhea of right breast (2008), H/O amenorrhea (07/2007), H/O dizziness (10/14/11), H/O dysmenorrhea (2010), H/O menorrhagia (10/14/11), H/O varicella, Headache(784.0), Heavy vaginal bleeding due to contraceptive injection use (10/12/2011), Herpes, HSV-2 infection (01/03/2009), UTI (urinary tract infection) (2009), Hypertension, Increased BMI (2010), Irregular uterine  bleeding (04/04/2012), Obesity (10/14/11), Oligomenorrhea (07/2007), Pelvic pain in female, Preterm labor, Trichomonas, and Yeast infection.   Surgical History    Past Surgical History:  Procedure Laterality Date  . [place]     Social History   reports that she has been smoking cigarettes. She has a 10.00 pack-year smoking history. She has never used smokeless tobacco. She reports that she does not drink alcohol or use drugs.   Family History   Her family history includes Diabetes in her father and mother; Heart disease in her father and mother; Hypertension in her father and mother; Stroke in her maternal grandfather. There is no history of Other.   Allergies Allergies  Allergen Reactions  . Strawberry Extract Hives  . Sulfa Antibiotics Hives and Itching     Home Medications  Prior to Admission medications   Medication Sig Start Date End Date Taking? Authorizing Provider  amLODipine (NORVASC) 10 MG tablet Take 1 tablet (10 mg total) by mouth daily. 10/02/19   [name], MD  aspirin EC 81 MG tablet Take 1 tablet (81 mg total) by mouth daily. 05/02/19   [name], MD  atorvastatin (LIPITOR) 40 MG tablet Take 1 tablet (40 mg total) by mouth daily. 10/02/19   [name], MD  blood glucose meter kit and supplies KIT Dispense based on patient and insurance preference. Use up to four times daily as directed. (FOR ICD-9 250.00, 250.01). 11/14/18   Clent Demark, PA-C  Blood Glucose Monitoring Suppl (TRUE METRIX METER) w/Device KIT 1 each by Does not apply route as needed. 10/17/18   Clent Demark, PA-C  Cetirizine HCl 10 MG CAPS Take 1 capsule (10 mg total) by mouth daily for 10 days. 09/26/19 10/06/19  Wieters, Hallie C, PA-C  cloNIDine (CATAPRES) 0.1 MG tablet Take 1 tablet (0.1 mg total) by mouth 2 (two) times daily. 10/02/19   [name], MD  glucose blood (TRUE METRIX BLOOD GLUCOSE TEST) test strip 1 each by Other route every morning.  05/02/19   [name], MD  insulin aspart (NOVOLOG FLEXPEN) 100 UNIT/ML FlexPen Inject 8 Units into the skin 3 (three) times daily with meals. 10/02/19   [name], MD  Insulin Glargine (BASAGLAR KWIKPEN) 100 UNIT/ML SOPN Inject 0.8 mLs (80 Units total) into the skin daily. 07/10/19   [name], MD  Insulin Pen Needle (B-D ULTRAFINE III SHORT PEN) 31G X 8 MM MISC 1 application by Does not apply route daily. 05/02/19   [name], MD  Lancet Devices (ADJUSTABLE LANCING DEVICE) MISC Inject 1 each into the skin 3 (three) times daily. 10/17/18   Clent Demark, PA-C  Lancets 30G MISC 1 each by Does not apply route 3 (three) times daily. 05/02/19   [name], MD  losartan-hydrochlorothiazide (HYZAAR) 100-25 MG tablet Take 1 tablet by mouth daily. 10/02/19   [name], MD  lurasidone (LATUDA) 40 MG TABS tablet Take 40 mg by mouth daily with breakfast.    [provider]  meloxicam (MOBIC) 7.5 MG tablet Take 1 tablet (7.5 mg total) by mouth daily. During painful menses 10/02/19   [name], MD  triamcinolone ointment (KENALOG) 0.5 % Apply thin amount twice daily to area of itching 09/26/19   Wieters, Elesa Hacker, PA-C     I, Dr Seward Carol have personally reviewed patient's available data, including medical history, events of note, physical examination and test results as part of my evaluation. I have discussed with PA Gleason  and Elink.  In addition,  I will personally evaluate patient post cath lab upon arrival to the ICU.  The patient is critically ill with multiple organ systems failure and requires high complexity decision making for assessment and support, frequent evaluation and titration of therapies, application of advanced monitoring technologies and extensive interpretation of multiple databases.   Critical Care Time devoted to patient care services described in this note is  82 Minutes. This time reflects time of care of this signee  Dr Seward Carol. This critical care time does not reflect procedure time, or teaching time or supervisory time  but could involve care discussion time   Dr. Seward Carol Pulmonary Critical Care Medicine  02/10/2020 3:27 AM  Critical care time: 55 mins

## 2020-02-10 NOTE — H&P (View-Only) (Signed)
Admit date: 02/09/2020 Referring Physician  Dr. Thayer Jew Primary Physician  none Primary Cardiologist  None Reason for Consultation  Complete heart block and chest pain  HPI: Sarah Long is a 49 y.o. female who is being seen today for the evaluation of complete heart block and chest pain at the request of Thayer Jew, MD.  This is a 49yo morbidly obese AAF with a hx of poorly controlled DM (unable to afford meds), HTN, HLD and ongoing tobacco abuse.  History is obtained from the chart as well as Dr. Dina Rich as the patient is in respiratory distress and lethargic from Versed.  Patient apparently was in her USOH until 4 hours ago when she started having severe 10/10 chest pain across her chest.  This was associated with severe SOB.  She was apparently nauseated today and had been vomiting.  She was dizzy and felt like she was going to pass out but no documented syncope.    On arrival in the ER was found to be in complete heart block with ventricular escape in the 30's.  She was started on external pacing.  Developed more respiratory distress and at the time of my examination is being intubated.  Labs included BS> 700, Na 123, K+ 4.9, Creatinine 2.9 and Hbg 13.6.  EKG showed NSR with complete heart block and ventricular escape.  Patient is being admitted to [facility] and Cardiology is now asked to consult for management of CHB and chest pain.    PMH:   Past Medical History:  Diagnosis Date  . Abnormal Pap smear 1998  . Abnormal vaginal bleeding 12/19/2011  . Bacterial infection   . Bipolar 1 disorder ([place])   . Blister of second toe of left foot 11/21/2016  . Candida vaginitis 07/2007  . Depression    recently added wellbutrin-has not taken yet for bipolar  . Diabetes in pregnancy   . Diabetes mellitus    nph 20U qam and qpm, regular with meals  . Fibroid   . Galactorrhea of right breast 2008  . H/O amenorrhea 07/2007  . H/O dizziness 10/14/11  . H/O dysmenorrhea 2010  . H/O  menorrhagia 10/14/11  . H/O varicella   . Headache(784.0)   . Heavy vaginal bleeding due to contraceptive injection use 10/12/2011   Depo Provera  . Herpes   . HSV-2 infection 01/03/2009  . Hx: UTI (urinary tract infection) 2009  . Hypertension    on aldomet  . Increased BMI 2010  . Irregular uterine bleeding 04/04/2012   Pt has mirena   . Obesity 10/14/11  . Oligomenorrhea 07/2007  . Pelvic pain in female   . Preterm labor   . Trichomonas   . Yeast infection      PSH:   Past Surgical History:  Procedure Laterality Date  . CESAREAN SECTION  1991    Allergies:  Strawberry extract and Sulfa antibiotics Prior to Admit Meds:  (Not in a hospital admission)  Fam HX:    Family History  Problem Relation Age of Onset  . Hypertension Mother   . Diabetes Mother   . Heart disease Mother   . Hypertension Father   . Diabetes Father   . Heart disease Father   . Stroke Maternal Grandfather   . Other Neg Hx    Social HX:    Social History   Socioeconomic History  . Marital status: Married    Spouse name: Not on file  . Number of children: Not on file  .  Years of education: Not on file  . Highest education level: Not on file  Occupational History  . Not on file  Tobacco Use  . Smoking status: Current Every Day Smoker    Packs/day: 0.50    Years: 20.00    Pack years: 10.00    Types: Cigarettes  . Smokeless tobacco: Never Used  Substance and Sexual Activity  . Alcohol use: No  . Drug use: No  . Sexual activity: Yes    Birth control/protection: None  Other Topics Concern  . Not on file  Social History Narrative  . Not on file   Social Determinants of Health   Financial Resource Strain:   . Difficulty of Paying Living Expenses: Not on file  Food Insecurity:   . Worried About Charity fundraiser in the Last Year: Not on file  . Ran Out of Food in the Last Year: Not on file  Transportation Needs:   . Lack of Transportation (Medical): Not on file  . Lack of  Transportation (Non-Medical): Not on file  Physical Activity:   . Days of Exercise per Week: Not on file  . Minutes of Exercise per Session: Not on file  Stress:   . Feeling of Stress : Not on file  Social Connections:   . Frequency of Communication with Friends and Family: Not on file  . Frequency of Social Gatherings with Friends and Family: Not on file  . Attends Religious Services: Not on file  . Active Member of Clubs or Organizations: Not on file  . Attends Archivist Meetings: Not on file  . Marital Status: Not on file  Intimate Partner Violence:   . Fear of Current or Ex-Partner: Not on file  . Emotionally Abused: Not on file  . Physically Abused: Not on file  . Sexually Abused: Not on file     ROS:  All  ROS were addressed and are negative except what is stated in the HPI  Physical Exam: Blood pressure (!) 179/156, pulse (!) 41, resp. rate (!) 27, height 5\' 4"  (1.626 m), weight 95.2 kg, last menstrual period 02/07/2020, SpO2 94 %.    General: obese, well developed in acute respiratory distress using accessory muscles of respiration Head: Eyes PERRLA, No xanthomas.   Normal cephalic and atramatic  Lungs:  Decrease BS throughout Heart:   HRRR S1 S2 Pulses are 2+ & equal.            No carotid bruit. No JVD.  No abdominal bruits. No femoral bruits. Abdomen: Bowel sounds are positive, abdomen soft and non-tender without masses or                  Hernia's noted. Msk:  Back normal Extremities:   No clubbing, cyanosis or edema.  DP +1 Neuro: Alert and oriented X 3 but very lethargic Psych:  Cannot assess  Labs:   Lab Results  Component Value Date   WBC 13.0 (H) 02/10/2020   HGB 13.6 02/10/2020   HCT 40.0 02/10/2020   MCV 103.8 (H) 02/10/2020   PLT 255 02/10/2020    Recent Labs  Lab 02/10/20 0039  NA 123*  K 4.9  CL 93*  BUN 29*  CREATININE 2.90*  GLUCOSE >700*   No results found for: PTT No results found for: INR, PROTIME No results found for:  CKTOTAL, CKMB, CKMBINDEX, TROPONINI   Lab Results  Component Value Date   CHOL 120 03/17/2017   CHOL 171 10/21/2016  CHOL 165 12/22/2015   Lab Results  Component Value Date   HDL 54 03/17/2017   HDL 62 10/21/2016   HDL 57 12/22/2015   Lab Results  Component Value Date   LDLCALC 30 03/17/2017   LDLCALC 86 10/21/2016   LDLCALC 90 12/22/2015   Lab Results  Component Value Date   TRIG 181 (H) 03/17/2017   TRIG 113 10/21/2016   TRIG 90 12/22/2015   Lab Results  Component Value Date   CHOLHDL 2.2 03/17/2017   CHOLHDL 2.8 10/21/2016   CHOLHDL 2.9 12/22/2015   No results found for: LDLDIRECT    Radiology:  DG Chest Portable 1 View  Result Date: 02/10/2020 CLINICAL DATA:  Shortness of breath. Vomiting. EXAM: PORTABLE CHEST 1 VIEW COMPARISON:  None. FINDINGS: Low lung volumes. Mild cardiomegaly. Interstitial prominence suggest pulmonary edema. No large pleural effusion. No pneumothorax. Overlying monitoring devices project over the chest. IMPRESSION: Mild cardiomegaly. Interstitial prominence suggest pulmonary edema. Low lung volumes. Electronically Signed   By: Keith Rake M.D.   On: 02/10/2020 00:33     Telemetry    NSR with complete heart block and slow ventricular escape - Personally Reviewed  ECG    NSR with complete heart block and slow ventricular escape - Personally Reviewed   ASSESSMENT/PLAN:    1.  Complete heart block -? Etiology -concern for ischemic origin given hx of severe CP for the past 4 hours -pain improved some with external pacing -K+ 4.9 -2D echo 06/2019 showed normal LVF with severe LVH -she is not on any AVN blocking agents -pacer turned down to assess underlying rhythm which remains complete heart block with ventricular escape at 25-30bpm -will take emergently to cath lab for temp pacing wire -check TSH  2.  Chest pain -started about 4 hours ago with associated SOB and N/V -she has multiple CRFs including poorly controlled DM, HLD,  ongoing tobacco use and fm hx of CAD -EKG shows ventricular escape rhythm with LBBB morphology -hsTrop 700 -creatinine elevated likely secondary to dehydration in the setting of severe hyperglycemia (last creatinine 0.92 in Aug 2020) -will discuss emergent heart cath with on call interventional Cards - increased risk in setting of AKI -start ASA 81mg  per NGT and high dose statin with Lipitor 80mg  daily -no BB due to CHB -check 2D echo to assess LVF  3.  HTN -BP on arrival was 133/20mmHg and has been as high as 186/132mmHg - currently 141/14mmHg -BP improved after intubation and propofol -no BB in setting of complete heart block -use IV Hydralazine PRN as needed  4.  Hyperglycemia -hx of poorly controlled DM with HbA1Cs in the 11-12% range since 2018 -has problems affording Insulin -BS>700 -Hyponatremic -CCM to manage  5.  Acute Respiratory Distress -secondary to mild interstitial edema, lethargy -s/p intubation in the ER -mechanical ventilation per CCM -admitting team  6.  AKI -creatinine was 0.91 in Aug 2020 and now 2.9 likely related to dehydration from glucosuria in setting of severe hyperglycemia -per CCM  The patient is critically ill with multiple organ systems failure and requires high complexity decision making for assessment and support, frequent evaluation and titration of therapies, application of advanced monitoring technologies and extensive interpretation of multiple databases. Critical Care Time devoted to patient care services described in this note independent of APP time is 60 minutes with >50% of time spent in direct patient care.     Fransico Him, MD  02/10/2020  12:49 AM

## 2020-02-10 NOTE — Interval H&P Note (Signed)
History and Physical Interval Note:  02/10/2020 2:08 AM  Sarah Long  has presented today for surgery, with the diagnosis of STEMI.  The various methods of treatment have been discussed with the patient and family. After consideration of risks, benefits and other options for treatment, the patient has consented to  Procedure(s): Coronary/Graft Acute MI Revascularization (N/A) LEFT HEART CATH AND CORONARY ANGIOGRAPHY (N/A) TEMPORARY PACEMAKER (N/A) as a surgical intervention.  The patient's history has been reviewed, patient examined, no change in status, stable for surgery.  I have reviewed the patient's chart and labs.  Questions were answered to the patient's satisfaction.     Shelva Majestic

## 2020-02-10 NOTE — Progress Notes (Signed)
  Echocardiogram 2D Echocardiogram has been performed.  Sarah Long 02/10/2020, 8:37 AM

## 2020-02-10 NOTE — Code Documentation (Signed)
Pacing pt at 34 mA and rate of 70 bmp

## 2020-02-11 ENCOUNTER — Other Ambulatory Visit (HOSPITAL_COMMUNITY): Payer: Medicaid Other

## 2020-02-11 DIAGNOSIS — Z9911 Dependence on respirator [ventilator] status: Secondary | ICD-10-CM

## 2020-02-11 DIAGNOSIS — K859 Acute pancreatitis without necrosis or infection, unspecified: Secondary | ICD-10-CM

## 2020-02-11 DIAGNOSIS — J9601 Acute respiratory failure with hypoxia: Secondary | ICD-10-CM

## 2020-02-11 DIAGNOSIS — I442 Atrioventricular block, complete: Secondary | ICD-10-CM | POA: Insufficient documentation

## 2020-02-11 DIAGNOSIS — E871 Hypo-osmolality and hyponatremia: Secondary | ICD-10-CM

## 2020-02-11 LAB — RENAL FUNCTION PANEL
Albumin: 2.3 g/dL — ABNORMAL LOW (ref 3.5–5.0)
Anion gap: 13 (ref 5–15)
BUN: 27 mg/dL — ABNORMAL HIGH (ref 6–20)
CO2: 17 mmol/L — ABNORMAL LOW (ref 22–32)
Calcium: 6.6 mg/dL — ABNORMAL LOW (ref 8.9–10.3)
Chloride: 103 mmol/L (ref 98–111)
Creatinine, Ser: 1.91 mg/dL — ABNORMAL HIGH (ref 0.44–1.00)
GFR calc Af Amer: 36 mL/min — ABNORMAL LOW (ref >60)
GFR calc non Af Amer: 31 mL/min — ABNORMAL LOW (ref >60)
Glucose, Bld: 144 mg/dL — ABNORMAL HIGH (ref 70–99)
Phosphorus: 3.1 mg/dL (ref 2.5–4.6)
Potassium: 4 mmol/L (ref 3.5–5.1)
Sodium: 133 mmol/L — ABNORMAL LOW (ref 135–145)

## 2020-02-11 LAB — BASIC METABOLIC PANEL
Anion gap: 12 (ref 5–15)
Anion gap: 9 (ref 5–15)
Anion gap: 11 (ref 5–15)
Anion gap: 9 (ref 5–15)
BUN: 27 mg/dL — ABNORMAL HIGH (ref 6–20)
BUN: 27 mg/dL — ABNORMAL HIGH (ref 6–20)
BUN: 28 mg/dL — ABNORMAL HIGH (ref 6–20)
BUN: 27 mg/dL — ABNORMAL HIGH (ref 6–20)
CO2: 18 mmol/L — ABNORMAL LOW (ref 22–32)
CO2: 18 mmol/L — ABNORMAL LOW (ref 22–32)
CO2: 17 mmol/L — ABNORMAL LOW (ref 22–32)
CO2: 18 mmol/L — ABNORMAL LOW (ref 22–32)
Calcium: 6.9 mg/dL — ABNORMAL LOW (ref 8.9–10.3)
Calcium: 6 mg/dL — CL (ref 8.9–10.3)
Calcium: 6.2 mg/dL — CL (ref 8.9–10.3)
Calcium: 6.2 mg/dL — CL (ref 8.9–10.3)
Chloride: 103 mmol/L (ref 98–111)
Chloride: 106 mmol/L (ref 98–111)
Chloride: 106 mmol/L (ref 98–111)
Chloride: 107 mmol/L (ref 98–111)
Creatinine, Ser: 1.76 mg/dL — ABNORMAL HIGH (ref 0.44–1.00)
Creatinine, Ser: 1.84 mg/dL — ABNORMAL HIGH (ref 0.44–1.00)
Creatinine, Ser: 1.86 mg/dL — ABNORMAL HIGH (ref 0.44–1.00)
Creatinine, Ser: 1.62 mg/dL — ABNORMAL HIGH (ref 0.44–1.00)
GFR calc Af Amer: 40 mL/min — ABNORMAL LOW (ref >60)
GFR calc Af Amer: 37 mL/min — ABNORMAL LOW (ref >60)
GFR calc Af Amer: 37 mL/min — ABNORMAL LOW (ref >60)
GFR calc Af Amer: 44 mL/min — ABNORMAL LOW (ref >60)
GFR calc non Af Amer: 34 mL/min — ABNORMAL LOW (ref >60)
GFR calc non Af Amer: 32 mL/min — ABNORMAL LOW (ref >60)
GFR calc non Af Amer: 32 mL/min — ABNORMAL LOW (ref >60)
GFR calc non Af Amer: 38 mL/min — ABNORMAL LOW (ref >60)
Glucose, Bld: 91 mg/dL (ref 70–99)
Glucose, Bld: 168 mg/dL — ABNORMAL HIGH (ref 70–99)
Glucose, Bld: 226 mg/dL — ABNORMAL HIGH (ref 70–99)
Glucose, Bld: 194 mg/dL — ABNORMAL HIGH (ref 70–99)
Potassium: 3.5 mmol/L (ref 3.5–5.1)
Potassium: 4 mmol/L (ref 3.5–5.1)
Potassium: 4.1 mmol/L (ref 3.5–5.1)
Potassium: 3.9 mmol/L (ref 3.5–5.1)
Sodium: 133 mmol/L — ABNORMAL LOW (ref 135–145)
Sodium: 133 mmol/L — ABNORMAL LOW (ref 135–145)
Sodium: 134 mmol/L — ABNORMAL LOW (ref 135–145)
Sodium: 134 mmol/L — ABNORMAL LOW (ref 135–145)

## 2020-02-11 LAB — POCT I-STAT, CHEM 8
BUN: 29 mg/dL — ABNORMAL HIGH (ref 6–20)
Calcium, Ion: 0.93 mmol/L — ABNORMAL LOW (ref 1.15–1.40)
Chloride: 93 mmol/L — ABNORMAL LOW (ref 98–111)
Creatinine, Ser: 2.7 mg/dL — ABNORMAL HIGH (ref 0.44–1.00)
Glucose, Bld: >700 mg/dL (ref 70–99)
HCT: 34 % — ABNORMAL LOW (ref 36.0–46.0)
Hemoglobin: 11.6 g/dL — ABNORMAL LOW (ref 12.0–15.0)
Potassium: 5.6 mmol/L — ABNORMAL HIGH (ref 3.5–5.1)
Sodium: 127 mmol/L — ABNORMAL LOW (ref 135–145)
TCO2: 14 mmol/L — ABNORMAL LOW (ref 22–32)

## 2020-02-11 LAB — POCT I-STAT 7, (LYTES, BLD GAS, ICA,H+H)
Acid-base deficit: 18 mmol/L — ABNORMAL HIGH (ref 0.0–2.0)
Bicarbonate: 11.6 mmol/L — ABNORMAL LOW (ref 20.0–28.0)
Calcium, Ion: 0.93 mmol/L — ABNORMAL LOW (ref 1.15–1.40)
HCT: 34 % — ABNORMAL LOW (ref 36.0–46.0)
Hemoglobin: 11.6 g/dL — ABNORMAL LOW (ref 12.0–15.0)
O2 Saturation: 100 %
Potassium: 5.4 mmol/L — ABNORMAL HIGH (ref 3.5–5.1)
Sodium: 126 mmol/L — ABNORMAL LOW (ref 135–145)
TCO2: 13 mmol/L — ABNORMAL LOW (ref 22–32)
pCO2 arterial: 40 mmHg (ref 32.0–48.0)
pH, Arterial: 7.069 — CL (ref 7.350–7.450)
pO2, Arterial: 413 mmHg — ABNORMAL HIGH (ref 83.0–108.0)

## 2020-02-11 LAB — GLUCOSE, CAPILLARY
Glucose-Capillary: 120 mg/dL — ABNORMAL HIGH (ref 70–99)
Glucose-Capillary: 137 mg/dL — ABNORMAL HIGH (ref 70–99)
Glucose-Capillary: 138 mg/dL — ABNORMAL HIGH (ref 70–99)
Glucose-Capillary: 145 mg/dL — ABNORMAL HIGH (ref 70–99)
Glucose-Capillary: 152 mg/dL — ABNORMAL HIGH (ref 70–99)
Glucose-Capillary: 156 mg/dL — ABNORMAL HIGH (ref 70–99)
Glucose-Capillary: 165 mg/dL — ABNORMAL HIGH (ref 70–99)
Glucose-Capillary: 166 mg/dL — ABNORMAL HIGH (ref 70–99)
Glucose-Capillary: 167 mg/dL — ABNORMAL HIGH (ref 70–99)
Glucose-Capillary: 178 mg/dL — ABNORMAL HIGH (ref 70–99)
Glucose-Capillary: 179 mg/dL — ABNORMAL HIGH (ref 70–99)
Glucose-Capillary: 203 mg/dL — ABNORMAL HIGH (ref 70–99)
Glucose-Capillary: 207 mg/dL — ABNORMAL HIGH (ref 70–99)
Glucose-Capillary: 207 mg/dL — ABNORMAL HIGH (ref 70–99)
Glucose-Capillary: 209 mg/dL — ABNORMAL HIGH (ref 70–99)
Glucose-Capillary: 215 mg/dL — ABNORMAL HIGH (ref 70–99)
Glucose-Capillary: 222 mg/dL — ABNORMAL HIGH (ref 70–99)
Glucose-Capillary: 229 mg/dL — ABNORMAL HIGH (ref 70–99)

## 2020-02-11 LAB — CBC WITH DIFFERENTIAL/PLATELET
Abs Immature Granulocytes: 0.08 10*3/uL — ABNORMAL HIGH (ref 0.00–0.07)
Basophils Absolute: 0 10*3/uL (ref 0.0–0.1)
Basophils Relative: 0 %
Eosinophils Absolute: 0.1 10*3/uL (ref 0.0–0.5)
Eosinophils Relative: 0 %
HCT: 33.9 % — ABNORMAL LOW (ref 36.0–46.0)
Hemoglobin: 11.4 g/dL — ABNORMAL LOW (ref 12.0–15.0)
Immature Granulocytes: 1 %
Lymphocytes Relative: 18 %
Lymphs Abs: 2.4 10*3/uL (ref 0.7–4.0)
MCH: 30.2 pg (ref 26.0–34.0)
MCHC: 33.6 g/dL (ref 30.0–36.0)
MCV: 89.7 fL (ref 80.0–100.0)
Monocytes Absolute: 0.9 10*3/uL (ref 0.1–1.0)
Monocytes Relative: 7 %
Neutro Abs: 10 10*3/uL — ABNORMAL HIGH (ref 1.7–7.7)
Neutrophils Relative %: 74 %
Platelets: 204 10*3/uL (ref 150–400)
RBC: 3.78 MIL/uL — ABNORMAL LOW (ref 3.87–5.11)
RDW: 13.6 % (ref 11.5–15.5)
WBC: 13.4 10*3/uL — ABNORMAL HIGH (ref 4.0–10.5)
nRBC: 0.2 % (ref 0.0–0.2)

## 2020-02-11 LAB — MAGNESIUM: Magnesium: 1.8 mg/dL (ref 1.7–2.4)

## 2020-02-11 LAB — TSH: TSH: 4.396 u[IU]/mL (ref 0.350–4.500)

## 2020-02-11 LAB — POCT ACTIVATED CLOTTING TIME: Activated Clotting Time: 164 seconds

## 2020-02-11 LAB — HEMOGLOBIN A1C
Hgb A1c MFr Bld: 12.7 % — ABNORMAL HIGH (ref 4.8–5.6)
Mean Plasma Glucose: 317.79 mg/dL

## 2020-02-11 LAB — BETA-HYDROXYBUTYRIC ACID
Beta-Hydroxybutyric Acid: 0.56 mmol/L — ABNORMAL HIGH (ref 0.05–0.27)
Beta-Hydroxybutyric Acid: 1.48 mmol/L — ABNORMAL HIGH (ref 0.05–0.27)

## 2020-02-11 LAB — LACTIC ACID, PLASMA: Lactic Acid, Venous: 2.1 mmol/L (ref 0.5–1.9)

## 2020-02-11 MED ORDER — ORAL CARE MOUTH RINSE: 15.0000 mL | Freq: Two times a day (BID) | OROMUCOSAL | Status: DC

## 2020-02-11 MED ORDER — SODIUM CHLORIDE 0.9 % IV SOLN: INTRAVENOUS | Status: DC

## 2020-02-11 MED ORDER — CHLORHEXIDINE GLUCONATE 0.12 % MT SOLN: 15.0000 mL | Freq: Two times a day (BID) | OROMUCOSAL | Status: DC

## 2020-02-11 MED ORDER — ONDANSETRON HCL 4 MG/2ML IJ SOLN
4.0000 mg | Freq: Four times a day (QID) | INTRAMUSCULAR | Status: DC | PRN
  Administered 2020-02-11 – 2020-02-15 (×8): 4 mg via INTRAVENOUS
  Filled 2020-02-11 (×8): qty 2

## 2020-02-11 MED ORDER — POTASSIUM CHLORIDE 10 MEQ/100ML IV SOLN
10.0000 meq | INTRAVENOUS | Status: AC
  Administered 2020-02-11 (×2): 10 meq via INTRAVENOUS
  Filled 2020-02-11 (×2): qty 100

## 2020-02-11 MED ORDER — INSULIN GLARGINE 100 UNIT/ML ~~LOC~~ SOLN
20.0000 [IU] | Freq: Two times a day (BID) | SUBCUTANEOUS | Status: DC
  Administered 2020-02-11: 20 [IU] via SUBCUTANEOUS
  Filled 2020-02-11 (×4): qty 0.2

## 2020-02-11 MED ORDER — CALCIUM GLUCONATE-NACL 2-0.675 GM/100ML-% IV SOLN
2.0000 g | Freq: Once | INTRAVENOUS | Status: AC
  Administered 2020-02-11: 2000 mg via INTRAVENOUS
  Filled 2020-02-11 (×2): qty 100

## 2020-02-11 MED ORDER — DEXTROSE-NACL 5-0.45 % IV SOLN: INTRAVENOUS | Status: DC

## 2020-02-11 MED ORDER — SODIUM CHLORIDE 0.9 % IV SOLN
INTRAVENOUS | Status: DC | PRN
  Administered 2020-02-11: 1000 mL via INTRAVENOUS
  Administered 2020-02-14 – 2020-02-22 (×3): 250 mL via INTRAVENOUS

## 2020-02-11 MED ORDER — MAGNESIUM SULFATE 2 GM/50ML IV SOLN
2.0000 g | Freq: Once | INTRAVENOUS | Status: AC
  Administered 2020-02-11: 2 g via INTRAVENOUS
  Filled 2020-02-11: qty 50

## 2020-02-11 MED ORDER — CALCIUM GLUCONATE-NACL 1-0.675 GM/50ML-% IV SOLN
1.0000 g | Freq: Once | INTRAVENOUS | Status: AC
  Administered 2020-02-11: 1000 mg via INTRAVENOUS
  Filled 2020-02-11: qty 50

## 2020-02-11 MED ORDER — DEXTROSE 50 % IV SOLN
0.0000 mL | INTRAVENOUS | Status: DC | PRN
  Administered 2020-02-14 – 2020-02-15 (×3): 25 mL via INTRAVENOUS
  Filled 2020-02-11 (×2): qty 50

## 2020-02-11 MED ORDER — INSULIN ASPART 100 UNIT/ML ~~LOC~~ SOLN
3.0000 [IU] | SUBCUTANEOUS | Status: DC
  Administered 2020-02-11: 9 [IU] via SUBCUTANEOUS

## 2020-02-11 MED ORDER — SODIUM CHLORIDE 0.9 % IV SOLN
3.0000 g | Freq: Four times a day (QID) | INTRAVENOUS | Status: DC
  Administered 2020-02-11 – 2020-02-14 (×12): 3 g via INTRAVENOUS
  Filled 2020-02-11 (×4): qty 3
  Filled 2020-02-11: qty 8
  Filled 2020-02-11 (×2): qty 3
  Filled 2020-02-11: qty 8
  Filled 2020-02-11 (×3): qty 3
  Filled 2020-02-11: qty 8
  Filled 2020-02-11: qty 3
  Filled 2020-02-11: qty 8
  Filled 2020-02-11 (×2): qty 3

## 2020-02-11 MED ORDER — FENTANYL CITRATE (PF) 100 MCG/2ML IJ SOLN
25.0000 ug | INTRAMUSCULAR | Status: DC | PRN
  Administered 2020-02-11 – 2020-02-25 (×29): 25 ug via INTRAVENOUS
  Filled 2020-02-11 (×31): qty 2

## 2020-02-11 MED ORDER — HEPARIN SODIUM (PORCINE) 5000 UNIT/ML IJ SOLN
5000.0000 [IU] | Freq: Three times a day (TID) | INTRAMUSCULAR | Status: DC
  Administered 2020-02-11 – 2020-02-22 (×32): 5000 [IU] via SUBCUTANEOUS
  Filled 2020-02-11 (×33): qty 1

## 2020-02-11 MED ORDER — INSULIN REGULAR(HUMAN) IN NACL 100-0.9 UT/100ML-% IV SOLN
INTRAVENOUS | Status: DC
  Administered 2020-02-11: 18:00:00 7.5 [IU]/h via INTRAVENOUS
  Administered 2020-02-11: 4.8 [IU]/h via INTRAVENOUS

## 2020-02-11 MED FILL — Heparin Sod (Porcine)-NaCl IV Soln 1000 Unit/500ML-0.9%: INTRAVENOUS | Qty: 500 | Status: AC

## 2020-02-11 NOTE — Progress Notes (Signed)
NAME:  Sarah Long, MRN:  [id number], DOB:  19-Apr-1971, LOS: 1 ADMISSION DATE:  02/09/2020, CONSULTATION DATE:  02/10/2020 REFERRING MD:  Dina Rich MD, CHIEF COMPLAINT:  CHEST PAIN   Brief History   49 yr old F w/ PMHx HTN and DM presents in 10/10 chest pain with elevated troponin 730 and in complete heart block. BG 893 mg/dl with AG 31. Seen by Cardiology and taken for LHC and TVP.  ED Provider Horton MD asked PCCM to admit.  History of present illness   (History obtained from EMR and acct of other providers due to pt being intubated)  49 yr old obese F w/ significant PMHx HTN, HLD and IDDM presented to [facility] with 10/10 chest pain. Per the triage note pt reported vomiting all day, being short of breath and having severe chest pain feeling like she was going to pass out. She denied loss of consciousness or syncopal episode.   Her initial Vitals were 133/85mmHg HR 41 RR 26 O2 sat 94% . Her CBG at 2358 was >600mg /dl  EKG showed an  Idioventricular rhythm with Left axis deviation conduction block, LVH and an inferior infarct (QRS 162 ms and QT/QTc 574/467 ms).  Troponin 730.   Then at 0013 pt went into complete heart block. BP 97/61 HR 39 bpm Received  Versed 4 mg at 0018  And a heparin gtt was started then pt was externally paced at 0020. BP improved. Per documentation pt became somnolent post versed and required endotracheally intubation 0046 and started on external pacing. Cardiology evaluated pt took to the Cath lab for LHC and TVP. Past Medical History  .[name] Active Ambulatory Problems    Diagnosis Date Noted  . Hypercholesteremia 02/02/2007  . Bipolar 1 disorder ([place]) 02/02/2007  . HYPERTENSION, BENIGN SYSTEMIC 02/02/2007  . Smoking 11/26/2013  . Diabetes type 2, uncontrolled ([place]) 10/21/2016  . 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 block) 05/29/2019  . Bilateral bunions 05/29/2019  . Chronic right shoulder pain 05/29/2019  . DM type 2 with diabetic peripheral  neuropathy ([place]) 05/30/2019  . Dysmenorrhea 10/02/2019   Resolved Ambulatory Problems    Diagnosis Date Noted  . Diabetes ([place]) 02/02/2007  . Threatened preterm labor 06/21/2011  . Diabetes mellitus complicating pregnancy, childbirth, or the puerperium 07/21/2011  . Irregular uterine bleeding 04/04/2012  . Vaginal yeast infection 10/21/2016  . Swelling of left ring finger 10/21/2016  . Blister of second toe of left foot 11/21/2016  . Sudden idiopathic hearing loss of left ear with unrestricted hearing of right ear 01/06/2018  . Holosystolic murmur [id number]   Past Medical History:  Diagnosis Date  . Abnormal Pap smear 1998  . Abnormal vaginal bleeding 12/19/2011  . Bacterial infection   . Candida vaginitis 07/2007  . Depression   . Diabetes in pregnancy   . Diabetes mellitus   . Fibroid   . Galactorrhea of right breast 2008  . H/O amenorrhea 07/2007  . H/O dizziness 10/14/11  . H/O dysmenorrhea 2010  . H/O menorrhagia 10/14/11  . H/O varicella   . Headache(784.0)   . Heavy vaginal bleeding due to contraceptive injection use 10/12/2011  . Herpes   . HSV-2 infection 01/03/2009  . Hx: UTI (urinary tract infection) 2009  . Hypertension   . Increased BMI 2010  . Obesity 10/14/11  . Oligomenorrhea 07/2007  . Pelvic pain in female   . Preterm labor   . Trichomonas   . Yeast infection  Significant Hospital Events   Complete Heart block--> Cardiology evaluated --> going to Cath Lab  Consults:  02/10/2020>> Cardiology 02/10/2020>> PCCM  Procedures:  02/10/2020>>Endotracheal intubation in ED 02/10/2020>> left heart cath, normal coronaries.  TVP placed  Significant Diagnostic Tests:   EKG at presentation-sinus tachycardia with complete heart block and Idioventricular rhythm Left axis deviation Non-specific intra-ventricular conduction block Left ventricular hypertrophy ( Cornell product , Romhilt-Estes ) Possible Lateral infarct , age undetermined Abnormal ECG  Last EKG  prior to Cath Lab Vent. rate 62 BPM QRS duration 166 ms QT/QTc 580/590 ms P-R-T axes * 105 -72 Consider left ventricular hypertrophy Repol abnrm, severe global ischemia (LM/MVD) Prolonged QT interval Baseline wander in lead(s) II III aVR aVL aVF V2 V3 V4 V5 V6  ECHO 02/10/2020 LVEF 40 to 45%, mildly decreased LV function with global hypokinesis.  Severe LVH.  Normal LA, RV, RA.  Mild MR, mild TR, normal aortic valve.  Micro Data:  Blood 3/7>>  Covid PCR negative  Antimicrobials:    Interim history:  High-dose insulin for presumed medication overdose causing complete heart block. Not on PTA Bblocker, verapamil or diltiazem.  On amlodipine- taking as prescribed. Pancreatitis on CT scan, elevated lipase. Has mild abdominal pain and nausea today.  No PTA lung disease, but smokes.  Objective   Blood pressure 120/60, pulse 80, temperature 100.2 F (37.9 C), temperature source Oral, resp. rate (!) 28, height 5\' 4"  (1.626 m), weight 100.2 kg, last menstrual period 02/07/2020, SpO2 99 %.    Vent Mode: PSV;CPAP FiO2 (%):  [40 %-50 %] 40 % Set Rate:  [28 bmp] 28 bmp Vt Set:  [430 mL] 430 mL PEEP:  [5 cmH20] 5 cmH20 Pressure Support:  [10 cmH20] 10 cmH20 Plateau Pressure:  [21 cmH20-23 cmH20] 23 cmH20   Intake/Output Summary (Last 24 hours) at 02/11/2020 0957 Last data filed at 02/11/2020 0800 Gross per 24 hour  Intake 5470.24 ml  Output 1090 ml  Net 4380.24 ml   Filed Weights   02/10/20 0500 02/10/20 2000 02/11/20 0500  Weight: 95.5 kg 96.8 kg 100.2 kg    Examination: General: obese middle aged woman in NAD HENT:  /AT, eyes anicteric. OGT & ETT in place. Lungs: CTAB anteriorly, decreased basilar breath sounds. Breathing comfortably on CPAP 5 + PS 8 Cardiovascular: RRR, entirely paced on tele.   Abdomen: obese, soft, minimally TTP. No bowel sounds. Extremities: no LE edema, no c/c Neuro: RASS 0, awake and alert, moving all extremities on command. Able to lift her head off the  pillow. Skin: warm, dry  CT chest abd pelvis- LL dependent pneumonia bilaterally. Peripancreatic inflammation. No Ileus or dilated loops of bowel.  Assessment & Plan:  1. Complete Heart Block; no ACS despite elevated troponin on presentation. LHC with normal coronary arteries. H/o HTN, HLD and tobacco use. TVP in place- dependent on today's exam per cardiology.  Appears to have advanced conduction disease with right bundle branch block and left anterior fascicular block.  She may require permanent pacemaker placement. -Appreciate cardiology's input.  Continue TVP per their recommendations -Avoiding Precedex as this may exacerbate bradycardia -Optimize metabolic picture  Acute hypoxic vent dependent respiratory failure.  Due to decompensated heart failure.  Possible aspiration pneumonitis versus pneumonia in lower lobes. -Past SBT.  Extubate to nasal cannula -Titrate supplemental oxygen as required to maintain SPO2 greater than 90% -IS, pulmonary hygiene -Unasyn x7 days for likely aspiration pneumonia  Anion Gap Metabolic Acidosis-mixed lactic acidosis and mild DKA AG 31  BG  893 mg/dl on presentation Beta-hydroxybutyric acid was only 0.82  Most likely multiple sources for AG Ph 6.877 on ABG after being intubated for somnolence after sedation.  No Lactate level; prior to eval in the setting of ACS No UDS no salicylate level and no Serum Osm Back into DKA this morning while on Endo tool. Plan: -Repeat 4 PM labs-BMP, beta hydroxybutyrate, lactic acidosis.  Hyponatremia Plan: -Stop hypotonic fluids -Continue to monitor.  May need fluid restriction.  Acute Kidney Injury-improving Pt has a h/o DM  Combination of pre -renal + diabetic nephropathy Plan: -Renally dose meds and avoid nephrotoxic meds -Strict I/O  Pancreatitis-cause unknown. Latuda and ARBs have been associated with this.  Has associated hypocalcemia. -1 g calcium gluconate -Pain control as needed -Enteral intake  when she is ready -May need NGT if nausea that is refractory to antiemetics  Macrocytosis on Labs MCV 103 No prior h/o ETOH documented Plan: -Continue supplements   Best practice:  Diet: NPO   Pain/Anxiety/Delirium protocol (if indicated): per protocol VAP protocol (if indicated): yes DVT prophylaxis: Heparin GI prophylaxis: PPI Glucose control: Insulin gtt for DKA Mobility: Bedrest Code Status: FULL Family Communication: Husband updated at bedside Disposition: ICU   Labs   CBC: Recent Labs  Lab 02/10/20 0002 02/10/20 0039 02/10/20 0246 02/10/20 0344 02/10/20 0553 02/10/20 1213 02/11/20 0436  WBC 13.0*  --   --  14.2*  --   --  13.4*  NEUTROABS  --   --   --   --   --   --  10.0*  HGB 11.8*   < > 11.6* 11.7* 10.5* 10.5* 11.4*  HCT 41.0   < > 34.0* 38.2 31.0* 31.0* 33.9*  MCV 103.8*  --   --  97.7  --   --  89.7  PLT 255  --   --  207  --   --  204   < > = values in this interval not displayed.    Basic Metabolic Panel: Recent Labs  Lab 02/10/20 0700 02/10/20 0700 02/10/20 1213 02/10/20 1447 02/10/20 1805 02/10/20 2153 02/11/20 0436 02/11/20 0816  NA 129*   < > 130* 132* 134*  --  133* 133*  K 5.9*   < > 3.5 3.5 3.5  --  4.0 4.0  CL 95*  --   --  101 102  --  103 106  CO2 16*  --   --  18* 19*  --  17* 18*  GLUCOSE 538*  --   --  254* 112*  --  144* 168*  BUN 29*  --   --  28* 26*  --  27* 27*  CREATININE 2.78*  --   --  2.39* 2.00*  --  1.91* 1.84*  CALCIUM 6.7*  --   --  7.1* 7.1*  --  6.6* 6.0*  MG  --   --   --   --   --  1.2* 1.8  --   PHOS  --   --   --   --   --  2.7 3.1  --    < > = values in this interval not displayed.   GFR: Estimated Creatinine Clearance: 44 mL/min (A) (by C-G formula based on SCr of 1.84 mg/dL (H)). Recent Labs  Lab 02/10/20 0002 02/10/20 0344 02/10/20 0700 02/10/20 2153 02/11/20 0436  WBC 13.0* 14.2*  --   --  13.4*  LATICACIDVEN  --  >11.0* 7.9* 1.9  --  Liver Function Tests: Recent Labs  Lab  02/10/20 0700 02/10/20 2019 02/11/20 0436  AST 736* 582*  --   ALT 275* 294*  --   ALKPHOS 68 58  --   BILITOT 1.0 0.7  --   PROT 5.5* 5.5*  --   ALBUMIN 2.6* 2.5* 2.3*   Recent Labs  Lab 02/10/20 2153  LIPASE 1,879*   No results for input(s): AMMONIA in the last 168 hours.  ABG    Component Value Date/Time   PHART 7.296 (L) 02/10/2020 1213   PCO2ART 36.7 02/10/2020 1213   PO2ART 96.0 02/10/2020 1213   HCO3 17.8 (L) 02/10/2020 1213   TCO2 19 (L) 02/10/2020 1213   ACIDBASEDEF 8.0 (H) 02/10/2020 1213   O2SAT 97.0 02/10/2020 1213     Coagulation Profile: No results for input(s): INR, PROTIME in the last 168 hours.  Cardiac Enzymes: No results for input(s): CKTOTAL, CKMB, CKMBINDEX, TROPONINI in the last 168 hours.  HbA1C: Hgb A1c MFr Bld  Date/Time Value Ref Range Status  02/11/2020 04:36 AM 12.7 (H) 4.8 - 5.6 % Final    Comment:    (NOTE) Pre diabetes:          5.7%-6.4% Diabetes:              >6.4% Glycemic control for   <7.0% adults with diabetes   02/10/2020 08:19 PM 12.9 (H) 4.8 - 5.6 % Final    Comment:    (NOTE) Pre diabetes:          5.7%-6.4% Diabetes:              >6.4% Glycemic control for   <7.0% adults with diabetes     CBG: Recent Labs  Lab 02/11/20 0508 02/11/20 0635 02/11/20 0741 02/11/20 0842 02/11/20 0946  GLUCAP 145* 152* 156* 166* 167*     This patient is critically ill with multiple organ system failure which requires frequent high complexity decision making, assessment, support, evaluation, and titration of therapies. This was completed through the application of advanced monitoring technologies and extensive interpretation of multiple databases. During this encounter critical care time was devoted to patient care services described in this note for 55 minutes.  Julian Hy, DO 02/11/20 11:07 AM [facility]

## 2020-02-11 NOTE — Progress Notes (Signed)
    Tried to wean temp wire pacer again. Patient still in CHB with junctional escape 50.   BP drops.   Back to V paced 80  Will consult EP in AM  Candee Furbish, MD

## 2020-02-11 NOTE — Progress Notes (Addendum)
Pharmacy Antibiotic Note  Sarah Long is a 49 y.o. female admitted on 02/09/2020 with CHB, DKA, and shock. Pt has also been found to have acute pancreatitis. Pharmacy has been consulted for Unasyn dosing with concern for aspiration.  Plan: -Unasyn 3g IV q6h x7 days -Pharmacy will sign off and follow peripherally for renal dose adjustments  Height: 5\' 4"  (162.6 cm) Weight: 220 lb 14.4 oz (100.2 kg) IBW/kg (Calculated) : 54.7  Temp (24hrs), Avg:99 F (37.2 C), Min:97.8 F (36.6 C), Max:100.2 F (37.9 C)  Recent Labs  Lab 02/10/20 0002 02/10/20 0039 02/10/20 0344 02/10/20 0344 02/10/20 0700 02/10/20 1447 02/10/20 1805 02/10/20 2153 02/11/20 0436 02/11/20 0816  WBC 13.0*  --  14.2*  --   --   --   --   --  13.4*  --   CREATININE 3.04*   < > 2.85*   < > 2.78* 2.39* 2.00*  --  1.91* 1.84*  LATICACIDVEN  --   --  >11.0*  --  7.9*  --   --  1.9  --   --    < > = values in this interval not displayed.    Estimated Creatinine Clearance: 44 mL/min (A) (by C-G formula based on SCr of 1.84 mg/dL (H)).    Allergies  Allergen Reactions  . Strawberry Extract Hives  . Sulfa Antibiotics Hives and Itching    Antimicrobials this admission: Unasyn 3/8 >> (3/14)   Dose adjustments this admission: none  Microbiology results: 3/7 BCx: NGTD  3/7 MRSA PCR: negative  Thank you for allowing pharmacy to be a part of this patient's care.  Arrie Senate, PharmD, BCPS Clinical Pharmacist [phone] Please check AMION for all [facility] numbers 02/11/2020

## 2020-02-11 NOTE — Plan of Care (Signed)

## 2020-02-11 NOTE — Progress Notes (Signed)
[facility] Progress Note Patient Name: Sarah Long DOB: [birth year] MRN: [id number]   Date of Service  02/11/2020  HPI/Events of Note  Mg++ = 1.2 and Creatinine = 2.0.   eICU Interventions  Will replace Mg++.      Intervention Category Major Interventions: Electrolyte abnormality - evaluation and management  Danial Hlavac [place] 02/11/2020, 12:42 AM

## 2020-02-11 NOTE — Progress Notes (Addendum)
Progress Note  Patient Name: Sarah Long Date of Encounter: 02/11/2020  Primary Cardiologist: Fransico Him, MD   Subjective   More alert today eyes open, following commands, ventricular paced, prior severe sinus bradycardia with complete heart block no native ventricular complexes.  Echo severe LVH EF 45%  Inpatient Medications    Scheduled Meds: . aspirin  81 mg Per NG tube Daily  . atorvastatin  80 mg Per NG tube q1800  . chlorhexidine gluconate (MEDLINE KIT)  15 mL Mouth Rinse BID  . Chlorhexidine Gluconate Cloth  6 each Topical Daily  . folic acid  1 mg Intravenous Daily  . gabapentin  100 mg Per Tube Q8H  . heparin injection (subcutaneous)  5,000 Units Subcutaneous Q8H  . mouth rinse  15 mL Mouth Rinse 10 times per day  . pantoprazole (PROTONIX) IV  40 mg Intravenous QHS  . sodium chloride flush  3 mL Intravenous Q12H  . thiamine injection  100 mg Intravenous Q24H   Continuous Infusions: . sodium chloride 75 mL/hr at 02/10/20 1208  . sodium chloride    . dexmedetomidine (PRECEDEX) IV infusion 0.4 mcg/kg/hr (02/11/20 0700)  . dextrose 5 % and 0.45% NaCl 75 mL/hr at 02/11/20 0700  . fentaNYL infusion INTRAVENOUS 50 mcg/hr (02/11/20 0700)  . insulin 0.5 mL/hr at 02/11/20 0700  . lactated ringers Stopped (02/10/20 2356)  . norepinephrine (LEVOPHED) Adult infusion    . phenylephrine (NEO-SYNEPHRINE) Adult infusion 90 mcg/min (02/11/20 0700)   PRN Meds: sodium chloride, Place/Maintain arterial line **AND** sodium chloride, acetaminophen, dextrose, docusate, fentaNYL, midazolam, midazolam, sodium chloride flush   Vital Signs    Vitals:   02/11/20 0615 02/11/20 0630 02/11/20 0645 02/11/20 0804  BP:      Pulse: 80 80 80   Resp: (!) 0 12 (!) 28   Temp:    100.2 F (37.9 C)  TempSrc:    Oral  SpO2: 98% 100% 99%   Weight:      Height:        Intake/Output Summary (Last 24 hours) at 02/11/2020 0853 Last data filed at 02/11/2020 0800 Gross per 24 hour  Intake 6140.7 ml   Output 1535 ml  Net 4605.7 ml   Last 3 Weights 02/11/2020 02/10/2020 02/10/2020  Weight (lbs) 220 lb 14.4 oz 213 lb 6.5 oz 210 lb 8.6 oz  Weight (kg) 100.2 kg 96.8 kg 95.5 kg      Telemetry    Ventricular pacing 80- Personally Reviewed  ECG    Sinus rhythm ventricular pacing- Personally Reviewed  Physical Exam   GEN:  Ill-appearing intubated morbidly obese Neck: No JVD Cardiac: RRR, no murmurs, rubs, or gallops.  Respiratory: Clear to auscultation bilaterally. GI: Soft, nontender, non-distended  MS: No edema; No deformity. Neuro:   Eyes open, following minor commands Psych: Unable to assess  Labs    High Sensitivity Troponin:   Recent Labs  Lab 02/10/20 0002 02/10/20 0133 02/10/20 0344  TROPONINIHS 730* 1,513* 2,024*      Chemistry Recent Labs  Lab 02/10/20 0700 02/10/20 1213 02/10/20 1447 02/10/20 1805 02/10/20 2019 02/11/20 0436  NA 129*   < > 132* 134*  --  133*  K 5.9*   < > 3.5 3.5  --  4.0  CL 95*  --  101 102  --  103  CO2 16*  --  18* 19*  --  17*  GLUCOSE 538*  --  254* 112*  --  144*  BUN 29*  --  28* 26*  --  27*  CREATININE 2.78*  --  2.39* 2.00*  --  1.91*  CALCIUM 6.7*  --  7.1* 7.1*  --  6.6*  PROT 5.5*  --   --   --  5.5*  --   ALBUMIN 2.6*  --   --   --  2.5* 2.3*  AST 736*  --   --   --  582*  --   ALT 275*  --   --   --  294*  --   ALKPHOS 68  --   --   --  58  --   BILITOT 1.0  --   --   --  0.7  --   GFRNONAA 20*  --  24* 29*  --  31*  GFRAA 23*  --  27* 34*  --  36*  ANIONGAP 18*  --  13 13  --  13   < > = values in this interval not displayed.     Hematology Recent Labs  Lab 02/10/20 0002 02/10/20 0039 02/10/20 0344 02/10/20 0344 02/10/20 0553 02/10/20 1213 02/11/20 0436  WBC 13.0*  --  14.2*  --   --   --  13.4*  RBC 3.95  --  3.91  --   --   --  3.78*  HGB 11.8*   < > 11.7*   < > 10.5* 10.5* 11.4*  HCT 41.0   < > 38.2   < > 31.0* 31.0* 33.9*  MCV 103.8*  --  97.7  --   --   --  89.7  MCH 29.9  --  29.9  --   --    --  30.2  MCHC 28.8*  --  30.6  --   --   --  33.6  RDW 13.7  --  13.7  --   --   --  13.6  PLT 255  --  207  --   --   --  204   < > = values in this interval not displayed.    BNPNo results for input(s): BNP, PROBNP in the last 168 hours.   DDimer No results for input(s): DDIMER in the last 168 hours.   Radiology    CT ABDOMEN PELVIS WO CONTRAST  Result Date: 02/10/2020 CLINICAL DATA:  49 year old female with abdominal pain. EXAM: CT ABDOMEN AND PELVIS WITHOUT CONTRAST TECHNIQUE: Multidetector CT imaging of the abdomen and pelvis was performed following the standard protocol without IV contrast. COMPARISON:  Abdominal radiograph dated 02/10/2020 and CT dated 11/11/2018. FINDINGS: Evaluation of this exam is limited in the absence of intravenous contrast. Lower chest: There are bibasilar consolidative changes with air bronchograms which may represent atelectasis versus pneumonia. Clinical correlation is recommended. Small bilateral pleural effusions noted. A right femoral vein line is noted with tip in the right ventricle. No intra-abdominal free air. Small free fluid within the pelvis as well as small upper abdominal ascites. Hepatobiliary: Advanced fatty infiltration of the liver. No intrahepatic biliary ductal dilatation. There is sludge within the gallbladder. Pancreas: Evaluation of the pancreas is very limited due to surrounding inflammation. Findings most consistent with acute pancreatitis. Correlation with pancreatic enzymes recommended. No drainable fluid collection/abscess or pseudocyst. Spleen: Normal in size without focal abnormality. Adrenals/Urinary Tract: The adrenal glands are unremarkable. There is no hydronephrosis or obstructing stone on either side. Faint high attenuating density in the right renal upper pole collecting system likely represent excreted contrast from recent intravenous administration. The  visualized ureters appear unremarkable. The urinary bladder is decompressed  around a Foley catheter. Excreted contrast noted within the urinary bladder. Stomach/Bowel: An enteric tube is noted with tip in the distal stomach. There is no bowel obstruction. The appendix is normal. Vascular/Lymphatic: Mild atherosclerotic calcification of the abdominal aorta. The IVC is grossly unremarkable. No portal venous gas. No adenopathy. Reproductive: The uterus is anteverted and grossly unremarkable. Other: Midline vertical anterior abdominal wall incisional scar. Musculoskeletal: Mild diffuse subcutaneous edema. No fluid collection. No acute osseous pathology. IMPRESSION: 1. Acute pancreatitis. No drainable fluid collection/abscess. 2. Advanced fatty infiltration of the liver. 3. Gallbladder sludge. 4. No bowel obstruction. Normal appendix. 5. Small bilateral pleural effusions and bibasilar atelectasis versus pneumonia. 6. Aortic Atherosclerosis (ICD10-I70.0). Electronically Signed   By: Anner Crete M.D.   On: 02/10/2020 21:24   DG Abd 1 View  Result Date: 02/10/2020 CLINICAL DATA:  Bedside NG/OG tube placement. EXAM: Portable ABDOMEN-1 VIEW 7:10 a.m.: COMPARISON:  Portable abdomen x-ray earlier same day at 5:11 a.m. FINDINGS: Nasogastric tube tip at the EG junction. The tube should be advanced several centimeters. Mild gaseous distention of the stomach. IMPRESSION: 1. Nasogastric tube tip at the EG junction. The tube should be advanced several centimeters. 2. Mild gaseous distention of the stomach. These results will be called to the ordering clinician or representative by the Radiologist Assistant, and communication documented in the PACS or zVision Dashboard. Electronically Signed   By: Evangeline Dakin M.D.   On: 02/10/2020 07:58   CARDIAC CATHETERIZATION  Result Date: 02/10/2020 Diabetic ketoacidosis with complete heart block with ventricular escape rhythm. Successful insertion of a temporary venous pacemaker advanced to the RV apex with excellent capture.  Currently set at 80 bpm, and  a 5. Normal coronary arteries. LVEDP 32 mmHg RECOMMENDATION: The patient will be admitted by the critical care medicine team.  Plan echo Doppler in a.m. to assess LV systolic and diastolic function.  Further management of the patient's DKA will be addressed by CCM.  DG Chest Port 1 View  Addendum Date: 02/10/2020   ADDENDUM REPORT: 02/10/2020 05:28 ADDENDUM: Upon further review, there is tubing coiled in the cervical region, presumably an enteric tube coiled in the pharynx. These results will be called to the ordering clinician or representative by the Radiologist Assistant, and communication documented in the PACS or zVision Dashboard. Electronically Signed   By: Vinnie Langton M.D.   On: 02/10/2020 05:28   Result Date: 02/10/2020 CLINICAL DATA:  49 year old female status post intubation. EXAM: PORTABLE CHEST 1 VIEW COMPARISON:  Chest x-ray 02/10/2020. FINDINGS: An endotracheal tube is in place with tip 3.2 cm above the carina. There is a left-sided internal jugular central venous catheter with tip terminating in the distal superior vena cava. Lung volumes are low. Ill-defined opacity in the central aspect of the right lung, likely in the right lower lobe concerning for airspace consolidation. No definite pleural effusions. No evidence of pulmonary edema. Heart size is upper limits of normal. Upper mediastinal contours are unremarkable. Transcutaneous defibrillator pad projecting over the left hemithorax. IMPRESSION: 1. Support apparatus, as above. 2. Extensive airspace consolidation in the right lower lobe concerning for pneumonia. Electronically Signed: By: Vinnie Langton M.D. On: 02/10/2020 05:24   DG Chest Portable 1 View  Result Date: 02/10/2020 CLINICAL DATA:  Intubation EXAM: PORTABLE CHEST 1 VIEW COMPARISON:  In February 10, 2020 FINDINGS: The endotracheal tube terminates above the carina by approximately 2.4 cm. The prominent interstitial lung markings are again noted. There  is cardiomegaly  pneumothorax. There may be small bilateral pleural effusions. There is no acute osseous abnormality. IMPRESSION: Endotracheal tube terminates above the carina by approximately 2.4 cm. Cardiomegaly with prominent interstitial lung markings which may represent pulmonary edema or an atypical infectious process. Electronically Signed   By: Constance Holster M.D.   On: 02/10/2020 01:16   DG Chest Portable 1 View  Result Date: 02/10/2020 CLINICAL DATA:  Shortness of breath. Vomiting. EXAM: PORTABLE CHEST 1 VIEW COMPARISON:  None. FINDINGS: Low lung volumes. Mild cardiomegaly. Interstitial prominence suggest pulmonary edema. No large pleural effusion. No pneumothorax. Overlying monitoring devices project over the chest. IMPRESSION: Mild cardiomegaly. Interstitial prominence suggest pulmonary edema. Low lung volumes. Electronically Signed   By: Keith Rake M.D.   On: 02/10/2020 00:33   DG Abd Portable 1V  Result Date: 02/10/2020 CLINICAL DATA:  49 year old female status post orogastric tube placement. EXAM: PORTABLE ABDOMEN - 1 VIEW COMPARISON:  Abdominal radiograph 05/20/2016. FINDINGS: No enteric tube identified. Gaseous distension of the stomach in the visualized portions of the abdomen. Lower abdomen was incompletely imaged. Transcutaneous defibrillator pad seen projecting over the left hemithorax and left upper abdomen. Central venous catheter projecting over the distal superior vena cava. IMPRESSION: 1. No enteric tube noted. Electronically Signed   By: Vinnie Langton M.D.   On: 02/10/2020 05:26   ECHOCARDIOGRAM COMPLETE  Result Date: 02/10/2020    ECHOCARDIOGRAM REPORT   Patient Name:   Sarah Long Date of Exam: 02/10/2020 Medical Rec #:  [id number]    Height:       64.0 in Accession #:    [id number]   Weight:       210.5 lb Date of Birth:  1971/09/25   BSA:          2.000 m Patient Age:    49 years     BP:           88/75 mmHg Patient Gender: F            HR:           80 bpm. Exam Location:   Inpatient Procedure: 2D Echo, Color Doppler and Cardiac Doppler                       STAT ECHO Reported to: Dr Kirk Ruths on 02/10/2020 8:36:00 AM. Indications:    Chest Pain 786.50 / R07.9  History:        Patient has prior history of Echocardiogram examinations, most                 recent 06/13/2019. Arrythmias:RBBB; Risk Factors:Hypertension,                 Diabetes, Dyslipidemia and Current Smoker. Complete heart block.                 Elevated troponin. NSTEMI. ACS.  Sonographer:    Clayton Lefort RDCS (AE) Referring Phys: [id number] TRACI R TURNER  Sonographer Comments: Patient is morbidly obese, echo performed with patient supine and on artificial respirator and suboptimal subcostal window. Image acquisition challenging due to patient body habitus. IMPRESSIONS  1. Technically difficult; mild to moderate global reduction in LV systolic function; severe LVH (myocardium with speckeld appearance; suggest further evaluation for amyloid); mild MR.  2. Left ventricular ejection fraction, by estimation, is 40 to 45%. The left ventricle has mildly decreased function. The left ventricle demonstrates global hypokinesis. There is severe left ventricular hypertrophy. Left ventricular diastolic  parameters  are indeterminate.  3. Right ventricular systolic function is normal. The right ventricular size is normal. There is normal pulmonary artery systolic pressure.  4. The mitral valve is normal in structure and function. Mild mitral valve regurgitation. No evidence of mitral stenosis.  5. The aortic valve is tricuspid. Aortic valve regurgitation is not visualized. No aortic stenosis is present.  6. The inferior vena cava is dilated in size with <50% respiratory variability, suggesting right atrial pressure of 15 mmHg. FINDINGS  Left Ventricle: Left ventricular ejection fraction, by estimation, is 40 to 45%. The left ventricle has mildly decreased function. The left ventricle demonstrates global hypokinesis. The left ventricular  internal cavity size was normal in size. There is  severe left ventricular hypertrophy. Left ventricular diastolic parameters are indeterminate. Right Ventricle: The right ventricular size is normal.Right ventricular systolic function is normal. There is normal pulmonary artery systolic pressure. The tricuspid regurgitant velocity is 1.84 m/s, and with an assumed right atrial pressure of 15 mmHg,  the estimated right ventricular systolic pressure is 56.3 mmHg. Left Atrium: Left atrial size was normal in size. Right Atrium: Right atrial size was normal in size. Pericardium: There is no evidence of pericardial effusion. Mitral Valve: The mitral valve is normal in structure and function. Normal mobility of the mitral valve leaflets. Mild mitral valve regurgitation. No evidence of mitral valve stenosis. Tricuspid Valve: The tricuspid valve is normal in structure. Tricuspid valve regurgitation is mild . No evidence of tricuspid stenosis. Aortic Valve: The aortic valve is tricuspid. Aortic valve regurgitation is not visualized. No aortic stenosis is present. Pulmonic Valve: The pulmonic valve was not well visualized. Pulmonic valve regurgitation is trivial. No evidence of pulmonic stenosis. Aorta: The aortic root is normal in size and structure. Venous: The inferior vena cava is dilated in size with less than 50% respiratory variability, suggesting right atrial pressure of 15 mmHg. IAS/Shunts: No atrial level shunt detected by color flow Doppler. Additional Comments: Technically difficult; mild to moderate global reduction in LV systolic function; severe LVH (myocardium with speckeld appearance; suggest further evaluation for amyloid); mild MR. A pacer wire is visualized.  LEFT VENTRICLE PLAX 2D LVIDd:         2.70 cm LVIDs:         2.30 cm LV PW:         2.80 cm LV IVS:        2.40 cm LVOT diam:     1.90 cm LVOT Area:     2.84 cm  RIGHT VENTRICLE            IVC RV Basal diam:  2.60 cm    IVC diam: 2.80 cm RV S prime:      7.50 cm/s TAPSE (M-mode): 0.8 cm LEFT ATRIUM             Index       RIGHT ATRIUM           Index LA diam:        3.50 cm 1.75 cm/m  RA Area:     18.80 cm LA Vol (A2C):   73.5 ml 36.76 ml/m RA Volume:   58.10 ml  29.06 ml/m LA Vol (A4C):   50.5 ml 25.26 ml/m LA Biplane Vol: 61.4 ml 30.71 ml/m   AORTA Ao Root diam: 2.30 cm Ao Asc diam:  2.70 cm TRICUSPID VALVE TR Peak grad:   13.5 mmHg TR Vmax:        184.00 cm/s  SHUNTS  Systemic Diam: 1.90 cm Kirk Ruths MD Electronically signed by Kirk Ruths MD Signature Date/Time: 02/10/2020/8:47:17 AM    Final     Cardiac Studies   Cardiac cath this admission-no CAD elevated EDP  Patient Profile     49 y.o. female with complete heart block presenting with prolonged chest discomfort diabetic ketoacidosis/lactic acidosis initially hypertensive but then quickly decompensating requiring intubation.  Emergent cardiac cath normal coronaries, shock, pacemaker dependent.  Assessment & Plan    Complete heart block -Likely secondary to metabolic abnormalities.  There was no associated ventricular tachycardia with her demise/shock although her hypertrophic substrate is concerning with her severe LVH.  Home meds did not include beta-blockers calcium channel blockers.  Currently has temporary pacer wire in place.  If neurologic improvement occurs, permanent pacemaker may be necessary. -We turned pacer down to 40, took an EKG, see epic.  She still has underlying complete heart block with heart rate of approximately 52, likely a more junctional escape.  Has right bundle branch block and left anterior fascicular block.  High-grade conduction disease.  Question infiltrative disorder, sarcoid?  We will keep temporary wire in place, we return pacer back to 80 to improve cardiac output.  She is still on phenylephrine.  If by tomorrow, still no improvement, we will consult EP for potential pacer.  Elevated troponin -2000, in the setting of severe metabolic acidosis,  shock, demand ischemia or type II myocardial infarction, not ACS.  Normal coronary arteries.  Severe LVH, systolic/diastolic dysfunction -No CAD, possibly a result of longstanding hypertension.  Shock -Could be multifactorial.  Differential is vast.  Continue with supportive care  Acute renal failure -Acute kidney injury, possible ATN.  Very little contrast dye used during catheterization.  Creatinine improving.  Shock liver -Underlying steatosis, monitoring  Acute respiratory failure -On ventilator.  Neurologically challenged but improving more alert.     CRITICAL CARE Performed by: Candee Furbish   Total critical care time: 45 minutes  Critical care time was exclusive of separately billable procedures and treating other patients.  Critical care was necessary to treat or prevent imminent or life-threatening deterioration.  Critical care was time spent personally by me on the following activities: development of treatment plan with patient and/or surrogate as well as nursing, discussions with consultants, evaluation of patient's response to treatment, examination of patient, obtaining history from patient or surrogate, ordering and performing treatments and interventions, ordering and review of laboratory studies, ordering and review of radiographic studies, pulse oximetry and re-evaluation of patient's condition.   For questions or updates, please contact [place] Please consult [email or web address] for contact info under        Signed, Candee Furbish, MD  02/11/2020, 8:53 AM

## 2020-02-11 NOTE — Progress Notes (Signed)
CRITICAL VALUE ALERT  Critical Value: lactic acid 2.1, calcium 6.2  Date & Time Notified: 02/11/2020 1705  Provider Notified: Carlis Abbott MD  Orders Received/Actions taken: calcium gluconate IVPB, restart insulin gtt

## 2020-02-11 NOTE — Progress Notes (Signed)
Inpatient Diabetes Program Recommendations  AACE/ADA: New Consensus Statement on Inpatient Glycemic Control (2015)  Target Ranges:  Prepandial:   less than 140 mg/dL      Peak postprandial:   less than 180 mg/dL (1-2 hours)      Critically ill patients:  140 - 180 mg/dL   Lab Results  Component Value Date   GLUCAP 166 (H) 02/11/2020   HGBA1C 12.7 (H) 02/11/2020    Review of Glycemic Control Results for DESHONNA, MARCANO (MRN [id number]) as of 02/11/2020 09:06  Ref. Range 02/11/2020 02:30 02/11/2020 04:01 02/11/2020 05:08 02/11/2020 06:35 02/11/2020 07:41 02/11/2020 08:42  Glucose-Capillary Latest Ref Range: 70 - 99 mg/dL 138 (H) 137 (H) 145 (H) 152 (H) 156 (H) 166 (H)     Diabetes history: DM2 Outpatient Diabetes medications: Novolog 8 units TID with meals + Basaglar 80 units QD Current orders for Inpatient glycemic control: IV insulin  Inpatient Diabetes Program Recommendations:     When MD is ready to transition to sub q injections please consider  -Lantus 20 units BID (50% of home dose) -Novolog 0-9 units TID Q4H  -Novolog 3 units tube feed coverage-stop is tube feeds held or discontinued  Thank you, Reche Dixon, RN, BSN Diabetes Coordinator Inpatient Diabetes Program [phone] (team pager from 8a-5p)

## 2020-02-11 NOTE — Progress Notes (Signed)
Wasted 150 mL of Fentanyl in stericycle. Eloise Harman, RN as witness

## 2020-02-11 NOTE — Progress Notes (Signed)
CRITICAL VALUE ALERT  Critical Value:  Calcium 6.0  Date & Time Notified: 02/11/2020 1015  Provider Notified: Carlis Abbott MD  Orders Received/Actions taken: calcium gluconate IV

## 2020-02-11 NOTE — Procedures (Signed)
Extubation Procedure Note  Patient Details:   Name: Sarah Long DOB: [birth year] MRN: [id number]   Airway Documentation:    Vent end date: 02/11/20 Vent end time: 1055   Evaluation  O2 sats: stable throughout Complications: No apparent complications Patient did tolerate procedure well. Bilateral Breath Sounds: Clear, Diminished   Yes   Patient extubated per MD order to 3L [place]. Positive cuff leak. No stridor noted. RN at beside. Vitals are stable.   Herbie Saxon Clois Treanor 02/11/2020, 10:58 AM

## 2020-02-12 DIAGNOSIS — R778 Other specified abnormalities of plasma proteins: Secondary | ICD-10-CM

## 2020-02-12 DIAGNOSIS — I249 Acute ischemic heart disease, unspecified: Secondary | ICD-10-CM

## 2020-02-12 LAB — BASIC METABOLIC PANEL
Anion gap: 11 (ref 5–15)
Anion gap: 11 (ref 5–15)
Anion gap: 9 (ref 5–15)
BUN: 28 mg/dL — ABNORMAL HIGH (ref 6–20)
BUN: 26 mg/dL — ABNORMAL HIGH (ref 6–20)
BUN: 23 mg/dL — ABNORMAL HIGH (ref 6–20)
CO2: 17 mmol/L — ABNORMAL LOW (ref 22–32)
CO2: 18 mmol/L — ABNORMAL LOW (ref 22–32)
CO2: 20 mmol/L — ABNORMAL LOW (ref 22–32)
Calcium: 6.2 mg/dL — CL (ref 8.9–10.3)
Calcium: 6.2 mg/dL — CL (ref 8.9–10.3)
Calcium: 5.9 mg/dL — CL (ref 8.9–10.3)
Chloride: 105 mmol/L (ref 98–111)
Chloride: 105 mmol/L (ref 98–111)
Chloride: 108 mmol/L (ref 98–111)
Creatinine, Ser: 1.59 mg/dL — ABNORMAL HIGH (ref 0.44–1.00)
Creatinine, Ser: 1.62 mg/dL — ABNORMAL HIGH (ref 0.44–1.00)
Creatinine, Ser: 1.4 mg/dL — ABNORMAL HIGH (ref 0.44–1.00)
GFR calc Af Amer: 45 mL/min — ABNORMAL LOW (ref >60)
GFR calc Af Amer: 44 mL/min — ABNORMAL LOW (ref >60)
GFR calc Af Amer: 52 mL/min — ABNORMAL LOW (ref >60)
GFR calc non Af Amer: 39 mL/min — ABNORMAL LOW (ref >60)
GFR calc non Af Amer: 38 mL/min — ABNORMAL LOW (ref >60)
GFR calc non Af Amer: 45 mL/min — ABNORMAL LOW (ref >60)
Glucose, Bld: 178 mg/dL — ABNORMAL HIGH (ref 70–99)
Glucose, Bld: 152 mg/dL — ABNORMAL HIGH (ref 70–99)
Glucose, Bld: 148 mg/dL — ABNORMAL HIGH (ref 70–99)
Potassium: 3.6 mmol/L (ref 3.5–5.1)
Potassium: 3.4 mmol/L — ABNORMAL LOW (ref 3.5–5.1)
Potassium: 3.5 mmol/L (ref 3.5–5.1)
Sodium: 133 mmol/L — ABNORMAL LOW (ref 135–145)
Sodium: 134 mmol/L — ABNORMAL LOW (ref 135–145)
Sodium: 137 mmol/L (ref 135–145)

## 2020-02-12 LAB — GLUCOSE, CAPILLARY
Glucose-Capillary: 165 mg/dL — ABNORMAL HIGH (ref 70–99)
Glucose-Capillary: 149 mg/dL — ABNORMAL HIGH (ref 70–99)
Glucose-Capillary: 133 mg/dL — ABNORMAL HIGH (ref 70–99)
Glucose-Capillary: 137 mg/dL — ABNORMAL HIGH (ref 70–99)
Glucose-Capillary: 143 mg/dL — ABNORMAL HIGH (ref 70–99)
Glucose-Capillary: 116 mg/dL — ABNORMAL HIGH (ref 70–99)
Glucose-Capillary: 172 mg/dL — ABNORMAL HIGH (ref 70–99)
Glucose-Capillary: 198 mg/dL — ABNORMAL HIGH (ref 70–99)

## 2020-02-12 LAB — BETA-HYDROXYBUTYRIC ACID
Beta-Hydroxybutyric Acid: 0.18 mmol/L (ref 0.05–0.27)
Beta-Hydroxybutyric Acid: 0.27 mmol/L (ref 0.05–0.27)

## 2020-02-12 LAB — CALCIUM, IONIZED: Calcium, Ionized, Serum: 4 mg/dL — ABNORMAL LOW (ref 4.5–5.6)

## 2020-02-12 LAB — MAGNESIUM: Magnesium: 1.7 mg/dL (ref 1.7–2.4)

## 2020-02-12 MED ORDER — DEXTROSE-NACL 5-0.9 % IV SOLN: INTRAVENOUS | Status: DC

## 2020-02-12 MED ORDER — CALCIUM GLUCONATE-NACL 2-0.675 GM/100ML-% IV SOLN
2.0000 g | Freq: Once | INTRAVENOUS | Status: AC
  Administered 2020-02-12: 02:00:00 2000 mg via INTRAVENOUS
  Filled 2020-02-12: qty 100

## 2020-02-12 MED ORDER — SODIUM CHLORIDE 0.9% FLUSH
10.0000 mL | Freq: Two times a day (BID) | INTRAVENOUS | Status: DC
  Administered 2020-02-12: 10 mL
  Administered 2020-02-12: 30 mL
  Administered 2020-02-13 – 2020-02-24 (×14): 10 mL

## 2020-02-12 MED ORDER — CALCIUM GLUCONATE-NACL 2-0.675 GM/100ML-% IV SOLN
2.0000 g | Freq: Once | INTRAVENOUS | Status: AC
  Administered 2020-02-12: 09:00:00 2000 mg via INTRAVENOUS
  Filled 2020-02-12: qty 100

## 2020-02-12 MED ORDER — MAGNESIUM SULFATE 4 GM/100ML IV SOLN
4.0000 g | Freq: Once | INTRAVENOUS | Status: AC
  Administered 2020-02-12: 4 g via INTRAVENOUS
  Filled 2020-02-12: qty 100

## 2020-02-12 MED ORDER — INSULIN GLARGINE 100 UNIT/ML ~~LOC~~ SOLN
30.0000 [IU] | Freq: Two times a day (BID) | SUBCUTANEOUS | Status: DC
  Administered 2020-02-12 – 2020-02-14 (×5): 30 [IU] via SUBCUTANEOUS
  Filled 2020-02-12 (×7): qty 0.3

## 2020-02-12 MED ORDER — INSULIN ASPART 100 UNIT/ML ~~LOC~~ SOLN
0.0000 [IU] | SUBCUTANEOUS | Status: DC
  Administered 2020-02-12: 2 [IU] via SUBCUTANEOUS
  Administered 2020-02-12 (×2): 3 [IU] via SUBCUTANEOUS
  Administered 2020-02-13: 2 [IU] via SUBCUTANEOUS
  Administered 2020-02-13 (×2): 3 [IU] via SUBCUTANEOUS

## 2020-02-12 MED ORDER — POTASSIUM CHLORIDE 10 MEQ/100ML IV SOLN
10.0000 meq | INTRAVENOUS | Status: AC
  Administered 2020-02-12: 10 meq via INTRAVENOUS
  Filled 2020-02-12: qty 100

## 2020-02-12 MED ORDER — SODIUM CHLORIDE 0.9% FLUSH: 10.0000 mL | INTRAVENOUS | Status: DC | PRN

## 2020-02-12 NOTE — Progress Notes (Signed)
CRITICAL VALUE ALERT  Critical Value:  Ca+ 6.2  Date & Time Notied:  02/12/2020  Provider Notified: Warren Lacy  Orders Received/Actions taken: Recheck @ 0100 with BMP labs due

## 2020-02-12 NOTE — Progress Notes (Signed)
Inpatient Diabetes Program Recommendations  AACE/ADA: New Consensus Statement on Inpatient Glycemic Control (2015)  Target Ranges:  Prepandial:   less than 140 mg/dL      Peak postprandial:   less than 180 mg/dL (1-2 hours)      Critically ill patients:  140 - 180 mg/dL   Lab Results  Component Value Date   GLUCAP 137 (H) 02/12/2020   HGBA1C 12.7 (H) 02/11/2020    Review of Glycemic Control Results for CYNETHIA, BLANCAS (MRN [id number]) as of 02/12/2020 10:23  Ref. Range 02/12/2020 03:41 02/12/2020 05:23 02/12/2020 07:37  Glucose-Capillary Latest Ref Range: 70 - 99 mg/dL 149 (H) 133 (H) 137 (H)   Diabetes history: DM2 Outpatient Diabetes medications: Novolog 8 units TID with meals + Basaglar 80 units QD Current orders for Inpatient glycemic control: IV insulin to transition to Lantus 30 units BID, Novolog 0-15 units Q4H  Inpatient Diabetes Program Recommendations:     Glucose trends within goal.   Attempted to speak with patient, however, sleeping and did not respond when calling name. Will plan to see when more appropriate.   Thanks, Bronson Curb, MSN, RNC-OB Diabetes Coordinator [phone] (8a-5p)

## 2020-02-12 NOTE — Plan of Care (Signed)
  Problem: Clinical Measurements: Goal: Diagnostic test results will improve Outcome: Progressing   Problem: Elimination: Goal: Will not experience complications related to urinary retention Outcome: Progressing   Problem: Pain Managment: Goal: General experience of comfort will improve Outcome: Progressing   

## 2020-02-12 NOTE — Progress Notes (Addendum)
Progress Note  Patient Name: Sarah Long Date of Encounter: 02/12/2020  Primary Cardiologist: Fransico Him, MD   Subjective   Awake, alert, much improved.  Still ventricular pacing.  Inpatient Medications    Scheduled Meds: . aspirin  81 mg Per NG tube Daily  . atorvastatin  80 mg Per NG tube q1800  . Chlorhexidine Gluconate Cloth  6 each Topical Daily  . folic acid  1 mg Intravenous Daily  . heparin  5,000 Units Subcutaneous Q8H  . insulin aspart  0-15 Units Subcutaneous Q4H  . insulin glargine  30 Units Subcutaneous BID  . pantoprazole (PROTONIX) IV  40 mg Intravenous QHS  . sodium chloride flush  10-40 mL Intracatheter Q12H  . sodium chloride flush  3 mL Intravenous Q12H  . thiamine injection  100 mg Intravenous Q24H   Continuous Infusions: . sodium chloride 10 mL/hr at 02/12/20 0800  . sodium chloride    . sodium chloride 10 mL/hr at 02/12/20 0800  . ampicillin-sulbactam (UNASYN) IV Stopped (02/12/20 [id number])  . calcium gluconate    . dextrose 5 % and 0.9% NaCl    . insulin 2.2 mL/hr at 02/12/20 0800  . phenylephrine (NEO-SYNEPHRINE) Adult infusion 5 mcg/min (02/12/20 0800)  . potassium chloride     PRN Meds: sodium chloride, Place/Maintain arterial line **AND** sodium chloride, sodium chloride, acetaminophen, dextrose, fentaNYL (SUBLIMAZE) injection, ondansetron (ZOFRAN) IV, sodium chloride flush, sodium chloride flush   Vital Signs    Vitals:   02/12/20 0630 02/12/20 0700 02/12/20 0754 02/12/20 0800  BP: 127/82 123/79  95/81  Pulse: 87 85  81  Resp: (!) 27 (!) 26  (!) 23  Temp:   98.9 F (37.2 C)   TempSrc:   Oral   SpO2: 97% 97%  99%  Weight:      Height:        Intake/Output Summary (Last 24 hours) at 02/12/2020 0845 Last data filed at 02/12/2020 0800 Gross per 24 hour  Intake 3373.55 ml  Output 330 ml  Net 3043.55 ml   Last 3 Weights 02/12/2020 02/11/2020 02/10/2020  Weight (lbs) 234 lb 12.6 oz 220 lb 14.4 oz 213 lb 6.5 oz  Weight (kg) 106.5 kg 100.2 kg  96.8 kg      Telemetry    V paced 80.  See below for details- Personally Reviewed  ECG    Yesterday, underlying right bundle branch block left anterior fascicular block complete heart block- Personally Reviewed  Physical Exam   GEN: No acute distress, laying on back in bed comfortable.   Neck: No JVD Cardiac: RRR, no murmurs, rubs, or gallops.  Respiratory: Clear to auscultation bilaterally. GI: Soft, nontender, non-distended  MS: No edema; No deformity. Neuro:  Nonfocal  Psych: Normal affect  Temporary pacer in place  Labs    High Sensitivity Troponin:   Recent Labs  Lab 02/10/20 0002 02/10/20 0133 02/10/20 0344  TROPONINIHS 730* 1,513* 2,024*      Chemistry Recent Labs  Lab 02/10/20 0700 02/10/20 1213 02/10/20 1805 02/10/20 2019 02/11/20 0436 02/11/20 0816 02/11/20 2100 02/12/20 0027 02/12/20 0529  NA 129*   < >   < >  --  133*   < > 134* 133* 134*  K 5.9*   < >   < >  --  4.0   < > 3.9 3.6 3.4*  CL 95*   < >   < >  --  103   < > 107 105 105  CO2 16*   < >   < >  --  17*   < > 18* 17* 18*  GLUCOSE 538*   < >   < >  --  144*   < > 194* 178* 152*  BUN 29*   < >   < >  --  27*   < > 27* 28* 26*  CREATININE 2.78*   < >   < >  --  1.91*   < > 1.62* 1.59* 1.62*  CALCIUM 6.7*   < >   < >  --  6.6*   < > 6.2* 6.2* 6.2*  PROT 5.5*  --   --  5.5*  --   --   --   --   --   ALBUMIN 2.6*  --   --  2.5* 2.3*  --   --   --   --   AST 736*  --   --  582*  --   --   --   --   --   ALT 275*  --   --  294*  --   --   --   --   --   ALKPHOS 68  --   --  58  --   --   --   --   --   BILITOT 1.0  --   --  0.7  --   --   --   --   --   GFRNONAA 20*   < >   < >  --  31*   < > 38* 39* 38*  GFRAA 23*   < >   < >  --  36*   < > 44* 45* 44*  ANIONGAP 18*   < >   < >  --  13   < > 9 11 11    < > = values in this interval not displayed.     Hematology Recent Labs  Lab 02/10/20 0002 02/10/20 0039 02/10/20 0344 02/10/20 0344 02/10/20 0553 02/10/20 1213 02/11/20 0436  WBC  13.0*  --  14.2*  --   --   --  13.4*  RBC 3.95  --  3.91  --   --   --  3.78*  HGB 11.8*   < > 11.7*   < > 10.5* 10.5* 11.4*  HCT 41.0   < > 38.2   < > 31.0* 31.0* 33.9*  MCV 103.8*  --  97.7  --   --   --  89.7  MCH 29.9  --  29.9  --   --   --  30.2  MCHC 28.8*  --  30.6  --   --   --  33.6  RDW 13.7  --  13.7  --   --   --  13.6  PLT 255  --  207  --   --   --  204   < > = values in this interval not displayed.    BNPNo results for input(s): BNP, PROBNP in the last 168 hours.   DDimer No results for input(s): DDIMER in the last 168 hours.   Radiology    CT ABDOMEN PELVIS WO CONTRAST  Result Date: 02/10/2020 CLINICAL DATA:  49 year old female with abdominal pain. EXAM: CT ABDOMEN AND PELVIS WITHOUT CONTRAST TECHNIQUE: Multidetector CT imaging of the abdomen and pelvis was performed following the standard protocol without IV contrast. COMPARISON:  Abdominal radiograph dated 02/10/2020 and CT dated 11/11/2018. FINDINGS: Evaluation of this exam is limited in the absence  of intravenous contrast. Lower chest: There are bibasilar consolidative changes with air bronchograms which may represent atelectasis versus pneumonia. Clinical correlation is recommended. Small bilateral pleural effusions noted. A right femoral vein line is noted with tip in the right ventricle. No intra-abdominal free air. Small free fluid within the pelvis as well as small upper abdominal ascites. Hepatobiliary: Advanced fatty infiltration of the liver. No intrahepatic biliary ductal dilatation. There is sludge within the gallbladder. Pancreas: Evaluation of the pancreas is very limited due to surrounding inflammation. Findings most consistent with acute pancreatitis. Correlation with pancreatic enzymes recommended. No drainable fluid collection/abscess or pseudocyst. Spleen: Normal in size without focal abnormality. Adrenals/Urinary Tract: The adrenal glands are unremarkable. There is no hydronephrosis or obstructing stone on  either side. Faint high attenuating density in the right renal upper pole collecting system likely represent excreted contrast from recent intravenous administration. The visualized ureters appear unremarkable. The urinary bladder is decompressed around a Foley catheter. Excreted contrast noted within the urinary bladder. Stomach/Bowel: An enteric tube is noted with tip in the distal stomach. There is no bowel obstruction. The appendix is normal. Vascular/Lymphatic: Mild atherosclerotic calcification of the abdominal aorta. The IVC is grossly unremarkable. No portal venous gas. No adenopathy. Reproductive: The uterus is anteverted and grossly unremarkable. Other: Midline vertical anterior abdominal wall incisional scar. Musculoskeletal: Mild diffuse subcutaneous edema. No fluid collection. No acute osseous pathology. IMPRESSION: 1. Acute pancreatitis. No drainable fluid collection/abscess. 2. Advanced fatty infiltration of the liver. 3. Gallbladder sludge. 4. No bowel obstruction. Normal appendix. 5. Small bilateral pleural effusions and bibasilar atelectasis versus pneumonia. 6. Aortic Atherosclerosis (ICD10-I70.0). Electronically Signed   By: Anner Crete M.D.   On: 02/10/2020 21:24    Cardiac Studies   Cardiac catheterization-normal coronary arteries, elevated left ventricular end-diastolic pressure  Echocardiogram-severe LVH, EF 45%  Patient Profile     49 y.o. female with ongoing complete heart block, normal coronary arteries, severe LVH, originally hypertensive quickly decompensating however requiring intubation emergent cath, temporary pacer wire.  Underlying metabolic derangement initially diabetic ketoacidosis.  Assessment & Plan    Complete heart block -Once again this morning turned pacer down, at 50 bpm of ventricular pacing, initially look like P waves were in line/communicating with V however at 40, P waves still remained dissociated, complete heart block.  Junctional escape.  Right  bundle branch block underlying with left anterior fascicular block. By now, I would have expected her AV node to awaken.  I have consulted electrophysiology for possible permanent pacemaker.  Elevated troponin -2000 in the setting of severe acidosis, type II myocardial infarction/demand ischemia.  Normal coronary arteries.  Severe LVH with systolic/diastolic dysfunction -No coronary disease, possibly result of longstanding hypertension.  Other possibilities include infiltrative potentially.  Shock -Multifactorial, resolved.  Acute kidney injury -Possible ATN.  Improving.  Acute respiratory failure -Extubated.  CRITICAL CARE Performed by: Candee Furbish   Total critical care time: 35 minutes  Critical care time was exclusive of separately billable procedures and treating other patients.  Critical care was necessary to treat or prevent imminent or life-threatening deterioration.  Critical care was time spent personally by me on the following activities: development of treatment plan with patient and/or surrogate as well as nursing, discussions with consultants, evaluation of patient's response to treatment, examination of patient, obtaining history from patient or surrogate, ordering and performing treatments and interventions, ordering and review of laboratory studies, ordering and review of radiographic studies, pulse oximetry and re-evaluation of patient's condition.  For questions or updates, please contact [place] Please consult [email or web address] for contact info under        Signed, Candee Furbish, MD  02/12/2020, 8:45 AM

## 2020-02-12 NOTE — Progress Notes (Signed)
OT Cancellation Note  Patient Details Name: Sarah Long MRN: [id number] DOB: [birth year]   Cancelled Treatment:    Reason Eval/Treat Not Completed: Patient not medically ready;Active bedrest order(Pt with temporary transvenous pacing and left heart cath.)   Pt with TVP in lower extremity and strict bedrest. OT to continue to follow when pt medically appropriate for OT evaluation.  Jefferey Pica, OTR/L Acute Rehabilitation Services Pager: [phone] Office: [phone]     Davan Nawabi C 02/12/2020, 10:09 AM

## 2020-02-12 NOTE — Progress Notes (Addendum)
NAME:  Sarah Long, MRN:  [id number], DOB:  1971/07/31, LOS: 2 ADMISSION DATE:  02/09/2020, CONSULTATION DATE:  02/10/2020 REFERRING MD:  Dina Rich MD, CHIEF COMPLAINT:  CHEST PAIN   Brief History   49 yr old F w/ PMHx HTN and DM presents in 10/10 chest pain with elevated troponin 730 and in complete heart block. BG 893 mg/dl with AG 31. Seen by Cardiology and taken for LHC and TVP.  ED Provider Horton MD asked PCCM to admit.  History of present illness   (History obtained from EMR and acct of other providers due to pt being intubated)  49 yr old obese F w/ significant PMHx HTN, HLD and IDDM presented to [facility] with 10/10 chest pain. Per the triage note pt reported vomiting all day, being short of breath and having severe chest pain feeling like she was going to pass out. She denied loss of consciousness or syncopal episode.   Her initial Vitals were 133/52mmHg HR 41 RR 26 O2 sat 94% . Her CBG at 2358 was >600mg /dl  EKG showed an  Idioventricular rhythm with Left axis deviation conduction block, LVH and an inferior infarct (QRS 162 ms and QT/QTc 574/467 ms).  Troponin 730.   Then at 0013 pt went into complete heart block. BP 97/61 HR 39 bpm Received  Versed 4 mg at 0018  And a heparin gtt was started then pt was externally paced at 0020. BP improved. Per documentation pt became somnolent post versed and required endotracheally intubation 0046 and started on external pacing. Cardiology evaluated pt took to the Cath lab for LHC and TVP. Past Medical History  .[name] Active Ambulatory Problems    Diagnosis Date Noted  . Hypercholesteremia 02/02/2007  . Bipolar 1 disorder ([place]) 02/02/2007  . HYPERTENSION, BENIGN SYSTEMIC 02/02/2007  . Smoking 11/26/2013  . Diabetes type 2, uncontrolled ([place]) 10/21/2016  . 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 block) 05/29/2019  . Bilateral bunions 05/29/2019  . Chronic right shoulder pain 05/29/2019  . DM type 2 with diabetic peripheral  neuropathy ([place]) 05/30/2019  . Dysmenorrhea 10/02/2019   Resolved Ambulatory Problems    Diagnosis Date Noted  . Diabetes ([place]) 02/02/2007  . Threatened preterm labor 06/21/2011  . Diabetes mellitus complicating pregnancy, childbirth, or the puerperium 07/21/2011  . Irregular uterine bleeding 04/04/2012  . Vaginal yeast infection 10/21/2016  . Swelling of left ring finger 10/21/2016  . Blister of second toe of left foot 11/21/2016  . Sudden idiopathic hearing loss of left ear with unrestricted hearing of right ear 01/06/2018  . Holosystolic murmur [id number]   Past Medical History:  Diagnosis Date  . Abnormal Pap smear 1998  . Abnormal vaginal bleeding 12/19/2011  . Bacterial infection   . Candida vaginitis 07/2007  . Depression   . Diabetes in pregnancy   . Diabetes mellitus   . Fibroid   . Galactorrhea of right breast 2008  . H/O amenorrhea 07/2007  . H/O dizziness 10/14/11  . H/O dysmenorrhea 2010  . H/O menorrhagia 10/14/11  . H/O varicella   . Headache(784.0)   . Heavy vaginal bleeding due to contraceptive injection use 10/12/2011  . Herpes   . HSV-2 infection 01/03/2009  . Hx: UTI (urinary tract infection) 2009  . Hypertension   . Increased BMI 2010  . Obesity 10/14/11  . Oligomenorrhea 07/2007  . Pelvic pain in female   . Preterm labor   . Trichomonas   . Yeast infection  Significant Hospital Events   Complete Heart block--> Cardiology evaluated --> going to Cath Lab  Consults:  02/10/2020>> Cardiology 02/10/2020>> PCCM  Procedures:  02/10/2020>>Endotracheal intubation in ED 02/10/2020>> left heart cath, normal coronaries.  TVP placed  Significant Diagnostic Tests:   EKG at presentation-sinus tachycardia with complete heart block and Idioventricular rhythm Left axis deviation Non-specific intra-ventricular conduction block Left ventricular hypertrophy ( Cornell product , Romhilt-Estes ) Possible Lateral infarct , age undetermined Abnormal ECG  Last EKG  prior to Cath Lab Vent. rate 62 BPM QRS duration 166 ms QT/QTc 580/590 ms P-R-T axes * 105 -72 Consider left ventricular hypertrophy Repol abnrm, severe global ischemia (LM/MVD) Prolonged QT interval Baseline wander in lead(s) II III aVR aVL aVF V2 V3 V4 V5 V6  ECHO 02/10/2020 LVEF 40 to 45%, mildly decreased LV function with global hypokinesis.  Severe LVH.  Normal LA, RV, RA.  Mild MR, mild TR, normal aortic valve.  CT chest abd pelvis- LL dependent pneumonia bilaterally. Peripancreatic inflammation. No Ileus or dilated loops of bowel.   Micro Data:  Blood 3/7>>  Covid PCR negative  Antimicrobials:  Unasyn 3/8>>   Interim history:  Remains extubated On low-dose neo however current hemodynamics suggest room to wean  BHydroxy is WNL this morning and endotool recs transitioning off insulin gtt   Objective   Blood pressure 125/68, pulse 82, temperature 98.5 F (36.9 C), temperature source Oral, resp. rate (!) 26, height 5\' 4"  (1.626 m), weight 106.5 kg, last menstrual period 02/07/2020, SpO2 96 %.    Vent Mode: PSV;CPAP FiO2 (%):  [40 %] 40 % PEEP:  [5 cmH20] 5 cmH20 Pressure Support:  [10 cmH20] 10 cmH20   Intake/Output Summary (Last 24 hours) at 02/12/2020 0714 Last data filed at 02/12/2020 0500 Gross per 24 hour  Intake 3097.17 ml  Output 525 ml  Net 2572.17 ml   Filed Weights   02/10/20 2000 02/11/20 0500 02/12/20 0500  Weight: 96.8 kg 100.2 kg 106.5 kg    Examination: General: WD obese middle aged F, supine in bed NAD  HENT:  NCAT  Pink mmm Anicteric sclera Trachea midline  Lungs: CTA bilaterally. Symmetrical chest expansion, no accessory muscle use Cardiovascular: Paced rhythm. s1s2 no rgm Cap refill < 3 seconds  Abdomen: Obese, soft, mildly distended, mildly tender. + bowel sounds  Extremities: Symmetrical bulk and tone, no obvious joint deformity, no cyanosis or clubbing  Neuro: AAOx4 following commands. PERRLA 44mm  Skin: c/d/w, without rash   Assessment &  Plan:  Complete Heart Block; no ACS despite elevated troponin on presentation. LHC with normal coronary arteries. H/o HTN, HLD and tobacco use. TVP in place- dependent on today's exam per cardiology.  Appears to have advanced conduction disease with right bundle branch block and left anterior fascicular block.  She may require permanent pacemaker placement. P -Appreciate cardiology's input.  Continue TVP per their recommendations -Optimize metabolic picture -- EP to eval for pacer  --Wean neo for MAP > 65, SBP > 90. Likely dc arterial line this afternoon, will leave for now  Acute hypoxic respiratory failure, improved  -Due to decompensated heart failure.   Possible aspiration pneumonitis versus pneumonia in lower lobes. P -IS, pulmonary hygiene -Unasyn x7 days for likely aspiration pneumonia  Anion Gap Metabolic Acidosis-mixed lactic acidosis and mild DKA, AG improved  AG 31  BG 893 mg/dl on presentation Beta-hydroxybutyric acid was only 0.82  Most likely multiple sources for AG Ph 6.877 on ABG after being intubated for somnolence after sedation.  No Lactate level; prior to eval in the setting of ACS No UDS no salicylate level and no Serum Osm Back into DKA this morning while on Endo tool. Beta hydroxybutyrate is WNL 3/9  Plan: -Trending BMP while we transition off of insulin gtt   Acute Kidney Injury-improving Pt has a h/o DM  Combination of pre -renal + diabetic nephropathy Plan: -Renally dose meds and avoid nephrotoxic meds -Strict I/O  Pancreatitis-cause unknown. Latuda and ARBs have been associated with this.  Has associated hypocalcemia. -Pain control as needed -Enteral intake when she is ready -May need NGT if nausea that is refractory to antiemetics  Hypocalcemia -likely in setting of above P -replaced 3/9. Check AM iCal  Hyponatremia, mild and asymptomatic Plan: -Change D5 1/2 to D5NS. Dc this when taking PO -Continue to monitor on BMP; may need fluid  restriction   Macrocytosis on Labs MCV 103 No prior h/o ETOH documented Plan: -Continue supplements   Best practice:  Diet: NPO   Pain/Anxiety/Delirium protocol (if indicated): per protocol VAP protocol (if indicated): yes DVT prophylaxis: Heparin GI prophylaxis: PPI Glucose control: Transitioning off insulin gtt to basal + SSI  Mobility: Bedrest Code Status: FULL Family Communication: patient updated at bedside 3/9 Disposition: ICU   Labs   CBC: Recent Labs  Lab 02/10/20 0002 02/10/20 0039 02/10/20 0246 02/10/20 0344 02/10/20 0553 02/10/20 1213 02/11/20 0436  WBC 13.0*  --   --  14.2*  --   --  13.4*  NEUTROABS  --   --   --   --   --   --  10.0*  HGB 11.8*   < > 11.6* 11.7* 10.5* 10.5* 11.4*  HCT 41.0   < > 34.0* 38.2 31.0* 31.0* 33.9*  MCV 103.8*  --   --  97.7  --   --  89.7  PLT 255  --   --  207  --   --  204   < > = values in this interval not displayed.    Basic Metabolic Panel: Recent Labs  Lab 02/10/20 1805 02/10/20 2153 02/11/20 0436 02/11/20 0816 02/11/20 1543 02/11/20 2100 02/12/20 0027  NA   < >  --  133* 133* 134* 134* 133*  K   < >  --  4.0 4.0 4.1 3.9 3.6  CL   < >  --  103 106 106 107 105  CO2   < >  --  17* 18* 17* 18* 17*  GLUCOSE   < >  --  144* 168* 226* 194* 178*  BUN   < >  --  27* 27* 28* 27* 28*  CREATININE   < >  --  1.91* 1.84* 1.86* 1.62* 1.59*  CALCIUM   < >  --  6.6* 6.0* 6.2* 6.2* 6.2*  MG  --  1.2* 1.8  --   --   --   --   PHOS  --  2.7 3.1  --   --   --   --    < > = values in this interval not displayed.   GFR: Estimated Creatinine Clearance: 52.6 mL/min (A) (by C-G formula based on SCr of 1.59 mg/dL (H)). Recent Labs  Lab 02/10/20 0002 02/10/20 0344 02/10/20 0700 02/10/20 2153 02/11/20 0436 02/11/20 1543  WBC 13.0* 14.2*  --   --  13.4*  --   LATICACIDVEN  --  >11.0* 7.9* 1.9  --  2.1*    Liver Function Tests: Recent Labs  Lab  02/10/20 0700 02/10/20 2019 02/11/20 0436  AST 736* 582*  --   ALT 275*  294*  --   ALKPHOS 68 58  --   BILITOT 1.0 0.7  --   PROT 5.5* 5.5*  --   ALBUMIN 2.6* 2.5* 2.3*   Recent Labs  Lab 02/10/20 2153  LIPASE 1,879*   No results for input(s): AMMONIA in the last 168 hours.  ABG    Component Value Date/Time   PHART 7.296 (L) 02/10/2020 1213   PCO2ART 36.7 02/10/2020 1213   PO2ART 96.0 02/10/2020 1213   HCO3 17.8 (L) 02/10/2020 1213   TCO2 19 (L) 02/10/2020 1213   ACIDBASEDEF 8.0 (H) 02/10/2020 1213   O2SAT 97.0 02/10/2020 1213     Coagulation Profile: No results for input(s): INR, PROTIME in the last 168 hours.  Cardiac Enzymes: No results for input(s): CKTOTAL, CKMB, CKMBINDEX, TROPONINI in the last 168 hours.  HbA1C: Hgb A1c MFr Bld  Date/Time Value Ref Range Status  02/11/2020 04:36 AM 12.7 (H) 4.8 - 5.6 % Final    Comment:    (NOTE) Pre diabetes:          5.7%-6.4% Diabetes:              >6.4% Glycemic control for   <7.0% adults with diabetes   02/10/2020 08:19 PM 12.9 (H) 4.8 - 5.6 % Final    Comment:    (NOTE) Pre diabetes:          5.7%-6.4% Diabetes:              >6.4% Glycemic control for   <7.0% adults with diabetes     CBG: Recent Labs  Lab 02/11/20 2236 02/11/20 2351 02/12/20 0154 02/12/20 0341 02/12/20 0523  GLUCAP 179* 165* 165* 149* 133*     CRITICAL CARE Performed by: Cristal Generous   Total critical care time: 38 minutes  Critical care time was exclusive of separately billable procedures and treating other patients.  Critical care was necessary to treat or prevent imminent or life-threatening deterioration.  Critical care was time spent personally by me on the following activities: development of treatment plan with patient and/or surrogate as well as nursing, discussions with consultants, evaluation of patient's response to treatment, examination of patient, obtaining history from patient or surrogate, ordering and performing treatments and interventions, ordering and review of laboratory studies,  ordering and review of radiographic studies, pulse oximetry and re-evaluation of patient's condition.  Eliseo Gum MSN, AGACNP-BC [place] [id number] If no answer, [id number] 02/12/2020, 7:14 AM   PCCM attending:  49 year old female admitted in complete heart block cardiogenic shock, left heart cath with normal coronaries, and found to be in DKA severe acidosis.  Was also had acute metabolic toxic encephalopathy which is since resolved related to critical illness.  Overall patient's extubated awake alert.  Doing well today.  TVP still in place for pacing.  BP (!) 55/40   Pulse (!) 31   Temp 98.9 F (37.2 C) (Oral)   Resp (!) 25   Ht 5\' 4"  (1.626 m)   Wt 106.5 kg   LMP 02/07/2020 Comment: negative preg test  SpO2 (!) 89%   BMI 40.30 kg/m   General: Obese young female resting in bed. Neck: Large Heart: Regular rhythm S1-S2 Lungs: Clear to auscultation bilaterally, no wheeze Abdomen: Obese pannus, soft nontender  Labs: Reviewed, renal failure improving, acidosis resolved, glucose 148 Chest x-ray: Small bilateral effusion on CT abdomen pelvis  Assessment: Complete heart  block, TVP in place Cardiogenic shock Acute hypoxic respiratory failure requiring intubation mechanical ventilation resolved DKA, anion gap metabolic acidosis Acute renal failure AKI resolved Multiple electrolyte abnormalities  Plan: Appreciate cardiology and electrophysiology input. At this time waiting to see if she has recovery of her conduction system. Patient remains intensive care unit for close hemodynamic respiratory support. Continue to correct electrolytes. Continue to monitor urine output, kidney function.  Slowly improving.  This patient is critically ill with multiple organ system failure; which, requires frequent high complexity decision making, assessment, support, evaluation, and titration of therapies. This was completed through the application of advanced  monitoring technologies and extensive interpretation of multiple databases. During this encounter critical care time was devoted to patient care services described in this note for 32 minutes.  Garner Nash, DO [facility] 02/12/2020 3:37 PM

## 2020-02-12 NOTE — Plan of Care (Signed)
  Problem: Clinical Measurements: Goal: Cardiovascular complication will be avoided Outcome: Progressing   Problem: Clinical Measurements: Goal: Ability to maintain clinical measurements within normal limits will improve Outcome: Progressing   Problem: Pain Managment: Goal: General experience of comfort will improve Outcome: Progressing   Problem: Elimination: Goal: Will not experience complications related to urinary retention Outcome: Progressing

## 2020-02-12 NOTE — Consult Note (Addendum)
ELECTROPHYSIOLOGY CONSULT NOTE    Patient ID: Sarah Long MRN: [id number], DOB/AGE: 08/15/1971 49 y.o.  Admit date: 02/09/2020 Date of Consult: 02/12/2020  Primary Physician: [name], MD Primary Cardiologist: Fransico Him, MD  Electrophysiologist: New   Referring Provider: Dr. Marlou Porch  Patient Profile: Sarah Long is a 49 y.o. female with a history of poorly controlled DM (unable to afford meds), HTN, HLD, and ongoing tobacco abuse who is being seen today for the evaluation of CHB at the request of Dr. Marlou Porch.  HPI:  Sarah Long is a 49 y.o. female with medical history above. She was admitted 02/09/2020 with approx 4 hours of severe chest pain. She had N/V preceding this. Also accompanied by dizziness and near syncope.  On arrival to the ER found to be in CHB with ventricular escape in the 30's and started on external pacing.   Required intubation with respiratory distress  Pertinent labs included Blood glucose > 700, Na 123, K 4.9, AKI with Cr 2.9, and Hgb 13.6.  CHB suspected due to metabolic acidosis.   Pt underwent LHC and temp pacer placement 02/10/2020 (Had been externally pacing).  This showed normal coronaries. Echo 02/10/2020 showed severe LVH and LVEF 40-45% in setting of acute illness. She continued to require pacing through the weekend. HS troponin up to 2000 in the setting of acidosis  Patient has junctional escape in 40-50s with P wave dissociation today despite relative correction of her metabolic abnormalities. Prompting EP consultation for consideration of permanent pacing.   She is feeling OK currently remains fatigued. Denies chest pain, palpitations, dyspnea, PND, orthopnea, nausea, vomiting, dizziness, syncope, edema, weight gain, or early satiety. She is anxious to get the temporary pacemaker out of her leg.   Past Medical History:  Diagnosis Date  . Abnormal Pap smear 1998  . Abnormal vaginal bleeding 12/19/2011  . Bacterial infection   . Bipolar 1  disorder ([place])   . Blister of second toe of left foot 11/21/2016  . Candida vaginitis 07/2007  . Depression    recently added wellbutrin-has not taken yet for bipolar  . Diabetes in pregnancy   . Diabetes mellitus    nph 20U qam and qpm, regular with meals  . Fibroid   . Galactorrhea of right breast 2008  . H/O amenorrhea 07/2007  . H/O dizziness 10/14/11  . H/O dysmenorrhea 2010  . H/O menorrhagia 10/14/11  . H/O varicella   . Headache(784.0)   . Heavy vaginal bleeding due to contraceptive injection use 10/12/2011   Depo Provera  . Herpes   . HSV-2 infection 01/03/2009  . Hx: UTI (urinary tract infection) 2009  . Hypertension    on aldomet  . Increased BMI 2010  . Irregular uterine bleeding 04/04/2012   Pt has mirena   . Obesity 10/14/11  . Oligomenorrhea 07/2007  . Pelvic pain in female   . Preterm labor   . Trichomonas   . Yeast infection      Surgical History:  Past Surgical History:  Procedure Laterality Date  . CESAREAN SECTION  1991  . LEFT HEART CATH AND CORONARY ANGIOGRAPHY N/A 02/10/2020   Procedure: LEFT HEART CATH AND CORONARY ANGIOGRAPHY;  Surgeon: Troy Sine, MD;  Location: [facility];  Service: Cardiovascular;  Laterality: N/A;  . TEMPORARY PACEMAKER N/A 02/10/2020   Procedure: TEMPORARY PACEMAKER;  Surgeon: Troy Sine, MD;  Location: [facility];  Service: Cardiovascular;  Laterality: N/A;     Facility-Administered  Medications Prior to Admission  Medication Dose Route Frequency Provider Last Rate Last Admin  . insulin aspart (novoLOG) injection 20 Units  20 Units Subcutaneous Once [name], MD       Medications Prior to Admission  Medication Sig Dispense Refill Last Dose  . hydrOXYzine (ATARAX/VISTARIL) 25 MG tablet Take 25 mg by mouth 2 (two) times daily as needed for anxiety.   unk at [employer]  . naproxen sodium (ALEVE) 220 MG tablet Take 220-440 mg by mouth 2 (two) times daily as needed (for back and muscle pain).   Past Week at  Unknown time  . amLODipine (NORVASC) 10 MG tablet Take 1 tablet (10 mg total) by mouth daily. (Patient not taking: Reported on 02/11/2020) 90 tablet 3 Not Taking at Unknown time  . aspirin EC 81 MG tablet Take 1 tablet (81 mg total) by mouth daily. (Patient not taking: Reported on 02/11/2020) 60 tablet 3 Not Taking at Unknown time  . atorvastatin (LIPITOR) 40 MG tablet Take 1 tablet (40 mg total) by mouth daily. (Patient not taking: Reported on 02/11/2020) 90 tablet 3 Not Taking at Unknown time  . blood glucose meter kit and supplies KIT Dispense based on patient and insurance preference. Use up to four times daily as directed. (FOR ICD-9 250.00, 250.01). 1 each 0 N/A  . Blood Glucose Monitoring Suppl (TRUE METRIX METER) w/Device KIT 1 each by Does not apply route as needed. 1 kit 0 N/A  . Cetirizine HCl 10 MG CAPS Take 1 capsule (10 mg total) by mouth daily for 10 days. (Patient not taking: Reported on 02/11/2020) 10 capsule 0 Not Taking at Unknown time  . cloNIDine (CATAPRES) 0.1 MG tablet Take 1 tablet (0.1 mg total) by mouth 2 (two) times daily. (Patient not taking: Reported on 02/11/2020) 60 tablet 3 Not Taking at Unknown time  . glucose blood (TRUE METRIX BLOOD GLUCOSE TEST) test strip 1 each by Other route every morning. 90 each 3 N/A  . insulin aspart (NOVOLOG FLEXPEN) 100 UNIT/ML FlexPen Inject 8 Units into the skin 3 (three) times daily with meals. (Patient not taking: Reported on 02/11/2020) 15 mL 11 Not Taking at Unknown time  . Insulin Glargine (BASAGLAR KWIKPEN) 100 UNIT/ML SOPN Inject 0.8 mLs (80 Units total) into the skin daily. (Patient not taking: Reported on 02/11/2020) 15 mL 2 Not Taking at Unknown time  . Insulin Pen Needle (B-D ULTRAFINE III SHORT PEN) 31G X 8 MM MISC 1 application by Does not apply route daily. 90 each 3 N/A  . Lancet Devices (ADJUSTABLE LANCING DEVICE) MISC Inject 1 each into the skin 3 (three) times daily. 100 each 5 N/A  . Lancets 30G MISC 1 each by Does not apply route 3  (three) times daily. 100 each 3 N/A  . losartan-hydrochlorothiazide (HYZAAR) 100-25 MG tablet Take 1 tablet by mouth daily. (Patient not taking: Reported on 02/11/2020) 90 tablet 3 Not Taking at Unknown time  . meloxicam (MOBIC) 7.5 MG tablet Take 1 tablet (7.5 mg total) by mouth daily. During painful menses (Patient not taking: Reported on 02/11/2020) 30 tablet 3 Not Taking at Unknown time  . triamcinolone ointment (KENALOG) 0.5 % Apply thin amount twice daily to area of itching (Patient not taking: Reported on 02/11/2020) 30 g 0 Not Taking at Unknown time    Inpatient Medications:  . aspirin  81 mg Per NG tube Daily  . atorvastatin  80 mg Per NG tube q1800  . Chlorhexidine Gluconate Cloth  6 each  Topical Daily  . folic acid  1 mg Intravenous Daily  . heparin  5,000 Units Subcutaneous Q8H  . insulin aspart  0-15 Units Subcutaneous Q4H  . insulin glargine  30 Units Subcutaneous BID  . pantoprazole (PROTONIX) IV  40 mg Intravenous QHS  . sodium chloride flush  10-40 mL Intracatheter Q12H  . sodium chloride flush  3 mL Intravenous Q12H  . thiamine injection  100 mg Intravenous Q24H    Allergies:  Allergies  Allergen Reactions  . Strawberry Extract Hives  . Sulfa Antibiotics Hives and Itching    Social History   Socioeconomic History  . Marital status: Married    Spouse name: Not on file  . Number of children: Not on file  . Years of education: Not on file  . Highest education level: Not on file  Occupational History  . Not on file  Tobacco Use  . Smoking status: Current Every Day Smoker    Packs/day: 0.50    Years: 20.00    Pack years: 10.00    Types: Cigarettes  . Smokeless tobacco: Never Used  Substance and Sexual Activity  . Alcohol use: No  . Drug use: No  . Sexual activity: Yes    Birth control/protection: None  Other Topics Concern  . Not on file  Social History Narrative  . Not on file   Social Determinants of Health   Financial Resource Strain:   . Difficulty  of Paying Living Expenses: Not on file  Food Insecurity:   . Worried About Charity fundraiser in the Last Year: Not on file  . Ran Out of Food in the Last Year: Not on file  Transportation Needs:   . Lack of Transportation (Medical): Not on file  . Lack of Transportation (Non-Medical): Not on file  Physical Activity:   . Days of Exercise per Week: Not on file  . Minutes of Exercise per Session: Not on file  Stress:   . Feeling of Stress : Not on file  Social Connections:   . Frequency of Communication with Friends and Family: Not on file  . Frequency of Social Gatherings with Friends and Family: Not on file  . Attends Religious Services: Not on file  . Active Member of Clubs or Organizations: Not on file  . Attends Archivist Meetings: Not on file  . Marital Status: Not on file  Intimate Partner Violence:   . Fear of Current or Ex-Partner: Not on file  . Emotionally Abused: Not on file  . Physically Abused: Not on file  . Sexually Abused: Not on file     Family History  Problem Relation Age of Onset  . Hypertension Mother   . Diabetes Mother   . Heart disease Mother   . Hypertension Father   . Diabetes Father   . Heart disease Father   . Stroke Maternal Grandfather   . Other Neg Hx      Review of Systems: All other systems reviewed and are otherwise negative except as noted above.  Physical Exam: Vitals:   02/12/20 0630 02/12/20 0700 02/12/20 0754 02/12/20 0800  BP: 127/82 123/79  95/81  Pulse: 87 85  81  Resp: (!) 27 (!) 26  (!) 23  Temp:   98.9 F (37.2 C)   TempSrc:   Oral   SpO2: 97% 97%  99%  Weight:      Height:        GEN- The patient is ill appearing alert  and oriented x 3 today.   HEENT: normocephalic, atraumatic; sclera clear, conjunctiva pink; hearing intact; oropharynx clear; neck supple Lungs- Clear to ausculation bilaterally, normal work of breathing.  No wheezes, rales, rhonchi Heart- Regular rate and rhythm, no murmurs, rubs or  gallops GI- soft, non-tender, non-distended, bowel sounds present Extremities- no clubbing, cyanosis, or edema; DP/PT/radial pulses 2+ bilaterally MS- no significant deformity or atrophy Skin- warm and dry, no rash or lesion Psych- euthymic mood, full affect Neuro- strength and sensation are intact  Labs:   Lab Results  Component Value Date   WBC 13.4 (H) 02/11/2020   HGB 11.4 (L) 02/11/2020   HCT 33.9 (L) 02/11/2020   MCV 89.7 02/11/2020   PLT 204 02/11/2020    Recent Labs  Lab 02/10/20 2019 02/11/20 0436 02/12/20 0529  NA  --    < > 134*  K  --    < > 3.4*  CL  --    < > 105  CO2  --    < > 18*  BUN  --    < > 26*  CREATININE  --    < > 1.62*  CALCIUM  --    < > 6.2*  PROT 5.5*  --   --   BILITOT 0.7  --   --   ALKPHOS 58  --   --   ALT 294*  --   --   AST 582*  --   --   GLUCOSE  --    < > 152*   < > = values in this interval not displayed.      Radiology/Studies: CT ABDOMEN PELVIS WO CONTRAST  Result Date: 02/10/2020 CLINICAL DATA:  49 year old female with abdominal pain. EXAM: CT ABDOMEN AND PELVIS WITHOUT CONTRAST TECHNIQUE: Multidetector CT imaging of the abdomen and pelvis was performed following the standard protocol without IV contrast. COMPARISON:  Abdominal radiograph dated 02/10/2020 and CT dated 11/11/2018. FINDINGS: Evaluation of this exam is limited in the absence of intravenous contrast. Lower chest: There are bibasilar consolidative changes with air bronchograms which may represent atelectasis versus pneumonia. Clinical correlation is recommended. Small bilateral pleural effusions noted. A right femoral vein line is noted with tip in the right ventricle. No intra-abdominal free air. Small free fluid within the pelvis as well as small upper abdominal ascites. Hepatobiliary: Advanced fatty infiltration of the liver. No intrahepatic biliary ductal dilatation. There is sludge within the gallbladder. Pancreas: Evaluation of the pancreas is very limited due to  surrounding inflammation. Findings most consistent with acute pancreatitis. Correlation with pancreatic enzymes recommended. No drainable fluid collection/abscess or pseudocyst. Spleen: Normal in size without focal abnormality. Adrenals/Urinary Tract: The adrenal glands are unremarkable. There is no hydronephrosis or obstructing stone on either side. Faint high attenuating density in the right renal upper pole collecting system likely represent excreted contrast from recent intravenous administration. The visualized ureters appear unremarkable. The urinary bladder is decompressed around a Foley catheter. Excreted contrast noted within the urinary bladder. Stomach/Bowel: An enteric tube is noted with tip in the distal stomach. There is no bowel obstruction. The appendix is normal. Vascular/Lymphatic: Mild atherosclerotic calcification of the abdominal aorta. The IVC is grossly unremarkable. No portal venous gas. No adenopathy. Reproductive: The uterus is anteverted and grossly unremarkable. Other: Midline vertical anterior abdominal wall incisional scar. Musculoskeletal: Mild diffuse subcutaneous edema. No fluid collection. No acute osseous pathology. IMPRESSION: 1. Acute pancreatitis. No drainable fluid collection/abscess. 2. Advanced fatty infiltration of the liver. 3. Gallbladder sludge. 4.  No bowel obstruction. Normal appendix. 5. Small bilateral pleural effusions and bibasilar atelectasis versus pneumonia. 6. Aortic Atherosclerosis (ICD10-I70.0). Electronically Signed   By: Anner Crete M.D.   On: 02/10/2020 21:24   DG Abd 1 View  Result Date: 02/10/2020 CLINICAL DATA:  Bedside NG/OG tube placement. EXAM: Portable ABDOMEN-1 VIEW 7:10 a.m.: COMPARISON:  Portable abdomen x-ray earlier same day at 5:11 a.m. FINDINGS: Nasogastric tube tip at the EG junction. The tube should be advanced several centimeters. Mild gaseous distention of the stomach. IMPRESSION: 1. Nasogastric tube tip at the EG junction. The tube  should be advanced several centimeters. 2. Mild gaseous distention of the stomach. These results will be called to the ordering clinician or representative by the Radiologist Assistant, and communication documented in the PACS or zVision Dashboard. Electronically Signed   By: Evangeline Dakin M.D.   On: 02/10/2020 07:58   CARDIAC CATHETERIZATION  Result Date: 02/10/2020 Diabetic ketoacidosis with complete heart block with ventricular escape rhythm. Successful insertion of a temporary venous pacemaker advanced to the RV apex with excellent capture.  Currently set at 80 bpm, and a 5. Normal coronary arteries. LVEDP 32 mmHg RECOMMENDATION: The patient will be admitted by the critical care medicine team.  Plan echo Doppler in a.m. to assess LV systolic and diastolic function.  Further management of the patient's DKA will be addressed by CCM.  DG Chest Port 1 View  Addendum Date: 02/10/2020   ADDENDUM REPORT: 02/10/2020 05:28 ADDENDUM: Upon further review, there is tubing coiled in the cervical region, presumably an enteric tube coiled in the pharynx. These results will be called to the ordering clinician or representative by the Radiologist Assistant, and communication documented in the PACS or zVision Dashboard. Electronically Signed   By: Vinnie Langton M.D.   On: 02/10/2020 05:28   Result Date: 02/10/2020 CLINICAL DATA:  49 year old female status post intubation. EXAM: PORTABLE CHEST 1 VIEW COMPARISON:  Chest x-ray 02/10/2020. FINDINGS: An endotracheal tube is in place with tip 3.2 cm above the carina. There is a left-sided internal jugular central venous catheter with tip terminating in the distal superior vena cava. Lung volumes are low. Ill-defined opacity in the central aspect of the right lung, likely in the right lower lobe concerning for airspace consolidation. No definite pleural effusions. No evidence of pulmonary edema. Heart size is upper limits of normal. Upper mediastinal contours are  unremarkable. Transcutaneous defibrillator pad projecting over the left hemithorax. IMPRESSION: 1. Support apparatus, as above. 2. Extensive airspace consolidation in the right lower lobe concerning for pneumonia. Electronically Signed: By: Vinnie Langton M.D. On: 02/10/2020 05:24   DG Chest Portable 1 View  Result Date: 02/10/2020 CLINICAL DATA:  Intubation EXAM: PORTABLE CHEST 1 VIEW COMPARISON:  In February 10, 2020 FINDINGS: The endotracheal tube terminates above the carina by approximately 2.4 cm. The prominent interstitial lung markings are again noted. There is cardiomegaly pneumothorax. There may be small bilateral pleural effusions. There is no acute osseous abnormality. IMPRESSION: Endotracheal tube terminates above the carina by approximately 2.4 cm. Cardiomegaly with prominent interstitial lung markings which may represent pulmonary edema or an atypical infectious process. Electronically Signed   By: Constance Holster M.D.   On: 02/10/2020 01:16   DG Chest Portable 1 View  Result Date: 02/10/2020 CLINICAL DATA:  Shortness of breath. Vomiting. EXAM: PORTABLE CHEST 1 VIEW COMPARISON:  None. FINDINGS: Low lung volumes. Mild cardiomegaly. Interstitial prominence suggest pulmonary edema. No large pleural effusion. No pneumothorax. Overlying monitoring devices project over the chest.  IMPRESSION: Mild cardiomegaly. Interstitial prominence suggest pulmonary edema. Low lung volumes. Electronically Signed   By: Keith Rake M.D.   On: 02/10/2020 00:33   DG Abd Portable 1V  Result Date: 02/10/2020 CLINICAL DATA:  49 year old female status post orogastric tube placement. EXAM: PORTABLE ABDOMEN - 1 VIEW COMPARISON:  Abdominal radiograph 05/20/2016. FINDINGS: No enteric tube identified. Gaseous distension of the stomach in the visualized portions of the abdomen. Lower abdomen was incompletely imaged. Transcutaneous defibrillator pad seen projecting over the left hemithorax and left upper abdomen. Central  venous catheter projecting over the distal superior vena cava. IMPRESSION: 1. No enteric tube noted. Electronically Signed   By: Vinnie Langton M.D.   On: 02/10/2020 05:26   ECHOCARDIOGRAM COMPLETE  Result Date: 02/10/2020    ECHOCARDIOGRAM REPORT   Patient Name:   DIVINITY KYLER Date of Exam: 02/10/2020 Medical Rec #:  [id number]    Height:       64.0 in Accession #:    [id number]   Weight:       210.5 lb Date of Birth:  01-Nov-1971   BSA:          2.000 m Patient Age:    82 years     BP:           88/75 mmHg Patient Gender: F            HR:           80 bpm. Exam Location:  Inpatient Procedure: 2D Echo, Color Doppler and Cardiac Doppler                       STAT ECHO Reported to: Dr Kirk Ruths on 02/10/2020 8:36:00 AM. Indications:    Chest Pain 786.50 / R07.9  History:        Patient has prior history of Echocardiogram examinations, most                 recent 06/13/2019. Arrythmias:RBBB; Risk Factors:Hypertension,                 Diabetes, Dyslipidemia and Current Smoker. Complete heart block.                 Elevated troponin. NSTEMI. ACS.  Sonographer:    Clayton Lefort RDCS (AE) Referring Phys: [id number] TRACI R TURNER  Sonographer Comments: Patient is morbidly obese, echo performed with patient supine and on artificial respirator and suboptimal subcostal window. Image acquisition challenging due to patient body habitus. IMPRESSIONS  1. Technically difficult; mild to moderate global reduction in LV systolic function; severe LVH (myocardium with speckeld appearance; suggest further evaluation for amyloid); mild MR.  2. Left ventricular ejection fraction, by estimation, is 40 to 45%. The left ventricle has mildly decreased function. The left ventricle demonstrates global hypokinesis. There is severe left ventricular hypertrophy. Left ventricular diastolic parameters  are indeterminate.  3. Right ventricular systolic function is normal. The right ventricular size is normal. There is normal pulmonary artery systolic  pressure.  4. The mitral valve is normal in structure and function. Mild mitral valve regurgitation. No evidence of mitral stenosis.  5. The aortic valve is tricuspid. Aortic valve regurgitation is not visualized. No aortic stenosis is present.  6. The inferior vena cava is dilated in size with <50% respiratory variability, suggesting right atrial pressure of 15 mmHg. FINDINGS  Left Ventricle: Left ventricular ejection fraction, by estimation, is 40 to 45%. The left ventricle has mildly decreased function. The left  ventricle demonstrates global hypokinesis. The left ventricular internal cavity size was normal in size. There is  severe left ventricular hypertrophy. Left ventricular diastolic parameters are indeterminate. Right Ventricle: The right ventricular size is normal.Right ventricular systolic function is normal. There is normal pulmonary artery systolic pressure. The tricuspid regurgitant velocity is 1.84 m/s, and with an assumed right atrial pressure of 15 mmHg,  the estimated right ventricular systolic pressure is 44.3 mmHg. Left Atrium: Left atrial size was normal in size. Right Atrium: Right atrial size was normal in size. Pericardium: There is no evidence of pericardial effusion. Mitral Valve: The mitral valve is normal in structure and function. Normal mobility of the mitral valve leaflets. Mild mitral valve regurgitation. No evidence of mitral valve stenosis. Tricuspid Valve: The tricuspid valve is normal in structure. Tricuspid valve regurgitation is mild . No evidence of tricuspid stenosis. Aortic Valve: The aortic valve is tricuspid. Aortic valve regurgitation is not visualized. No aortic stenosis is present. Pulmonic Valve: The pulmonic valve was not well visualized. Pulmonic valve regurgitation is trivial. No evidence of pulmonic stenosis. Aorta: The aortic root is normal in size and structure. Venous: The inferior vena cava is dilated in size with less than 50% respiratory variability, suggesting  right atrial pressure of 15 mmHg. IAS/Shunts: No atrial level shunt detected by color flow Doppler. Additional Comments: Technically difficult; mild to moderate global reduction in LV systolic function; severe LVH (myocardium with speckeld appearance; suggest further evaluation for amyloid); mild MR. A pacer wire is visualized.  LEFT VENTRICLE PLAX 2D LVIDd:         2.70 cm LVIDs:         2.30 cm LV PW:         2.80 cm LV IVS:        2.40 cm LVOT diam:     1.90 cm LVOT Area:     2.84 cm  RIGHT VENTRICLE            IVC RV Basal diam:  2.60 cm    IVC diam: 2.80 cm RV S prime:     7.50 cm/s TAPSE (M-mode): 0.8 cm LEFT ATRIUM             Index       RIGHT ATRIUM           Index LA diam:        3.50 cm 1.75 cm/m  RA Area:     18.80 cm LA Vol (A2C):   73.5 ml 36.76 ml/m RA Volume:   58.10 ml  29.06 ml/m LA Vol (A4C):   50.5 ml 25.26 ml/m LA Biplane Vol: 61.4 ml 30.71 ml/m   AORTA Ao Root diam: 2.30 cm Ao Asc diam:  2.70 cm TRICUSPID VALVE TR Peak grad:   13.5 mmHg TR Vmax:        184.00 cm/s  SHUNTS Systemic Diam: 1.90 cm Kirk Ruths MD Electronically signed by Kirk Ruths MD Signature Date/Time: 02/10/2020/8:47:17 AM    Final     EKG: on arrival 3/6 shows CHB with junctional escape at 39 bpm (personally reviewed) 3/8 underlying rhythm continues to show Advanced AV block with junctional escape at 52 bpm  TELEMETRY: V paced at 80 bpm via temp pacer. Underlying is CHB with junctional escape in 40s this am. (personally reviewed)  Assessment/Plan: 1.  Complete Heart Block Initially thought to be in the setting of severe metabolic acidosis and DKA, but has not improved with correction thus far.  Labs this am  include Na 134, K 3.4, Cr 1.62, Calcium 6.2 *, Mg 1.7 today (supp). LFTs remained elevated 3/7 Will continue to follow for now and give her a little more time for conduction to recover. Would like to try and avoid pacing if possible in this young lady.   2. Shock Improving, no evidence of  infectious process. Thought to be 2/2 hypovolemia in setting of uncontrolled hyperglycemia.  Acidosis is improving.   3. ARF  Creatinine improved and seems to have stabilized ~ 1.6. Peak of 2.85.  4. Acute hypoxic respiratory failure Now extubated,  For questions or updates, please contact [facility] Please consult [email or web address] for contact info under Cardiology/STEMI.  Signed, Shirley Friar, PA-C  02/12/2020    I have seen, examined the patient, and reviewed the above assessment and plan.  Changes to above are made where necessary.  On exam, RRR (paced).  The patient has ongoing AV block despite improvement in her metabolic derangement.  She has a long history of conduction system disease with bifascicular block and is likely to require PPM.  Her echo reveals severe LVH with speckeld appearance concerning for an infiltrative process. Given recent and ongoing renal failure I do not feel that MRI is an option.  If her conduction is not improved, I would anticipate pacing tomorrow with subsequent follow-up for possible infiltrative disease, sarcoid, etc as potential cause for her AV block.  Dr Caryl Comes to follow-up tomorrow.  Co Sign: Thompson Grayer, MD 02/12/2020 8:10 PM

## 2020-02-12 NOTE — Progress Notes (Signed)
[facility] Progress Note Patient Name: Sarah Long DOB: 08/25/1971 MRN: [id number]   Date of Service  02/12/2020  HPI/Events of Note  Ca++ = 6.2 which corrects to 7.56 (Low) given albumin = 2.3.  eICU Interventions  Will replace Ca++.     Intervention Category Major Interventions: Electrolyte abnormality - evaluation and management  Clytie Shetley Eugene 02/12/2020, 1:39 AM

## 2020-02-12 NOTE — Progress Notes (Signed)
PT Cancellation Note  Patient Details Name: Sarah Long MRN: [id number] DOB: 1970-12-27   Cancelled Treatment:    Reason Eval/Treat Not Completed: Patient not medically ready Pt with temporary transvenous pacing in femoral region. Not safe for OOB. Will follow.   Marguarite Arbour A Eurika Sandy 02/12/2020, 1:29 PM Marisa Severin, PT, DPT Acute Rehabilitation Services Pager [phone] Office [phone]

## 2020-02-13 ENCOUNTER — Inpatient Hospital Stay (HOSPITAL_COMMUNITY)
Admission: EM | Disposition: A | Discharge status: Home Health | Payer: Self-pay | Source: Home / Self Care | Attending: Internal Medicine

## 2020-02-13 DIAGNOSIS — I442 Atrioventricular block, complete: Secondary | ICD-10-CM

## 2020-02-13 HISTORY — PX: PACEMAKER IMPLANT: EP1218

## 2020-02-13 LAB — BASIC METABOLIC PANEL
Anion gap: 12 (ref 5–15)
BUN: 19 mg/dL (ref 6–20)
CO2: 20 mmol/L — ABNORMAL LOW (ref 22–32)
Calcium: 5.3 mg/dL — CL (ref 8.9–10.3)
Chloride: 108 mmol/L (ref 98–111)
Creatinine, Ser: 1.27 mg/dL — ABNORMAL HIGH (ref 0.44–1.00)
GFR calc Af Amer: 59 mL/min — ABNORMAL LOW (ref >60)
GFR calc non Af Amer: 51 mL/min — ABNORMAL LOW (ref >60)
Glucose, Bld: 208 mg/dL — ABNORMAL HIGH (ref 70–99)
Potassium: 3.7 mmol/L (ref 3.5–5.1)
Sodium: 140 mmol/L (ref 135–145)

## 2020-02-13 LAB — MAGNESIUM: Magnesium: 2.2 mg/dL (ref 1.7–2.4)

## 2020-02-13 LAB — CBC
HCT: 29.4 % — ABNORMAL LOW (ref 36.0–46.0)
Hemoglobin: 9.4 g/dL — ABNORMAL LOW (ref 12.0–15.0)
MCH: 29.8 pg (ref 26.0–34.0)
MCHC: 32 g/dL (ref 30.0–36.0)
MCV: 93.3 fL (ref 80.0–100.0)
Platelets: 159 10*3/uL (ref 150–400)
RBC: 3.15 MIL/uL — ABNORMAL LOW (ref 3.87–5.11)
RDW: 14.7 % (ref 11.5–15.5)
WBC: 10.3 10*3/uL (ref 4.0–10.5)
nRBC: 0 % (ref 0.0–0.2)

## 2020-02-13 LAB — GLUCOSE, CAPILLARY
Glucose-Capillary: 135 mg/dL — ABNORMAL HIGH (ref 70–99)
Glucose-Capillary: 185 mg/dL — ABNORMAL HIGH (ref 70–99)
Glucose-Capillary: 154 mg/dL — ABNORMAL HIGH (ref 70–99)
Glucose-Capillary: 143 mg/dL — ABNORMAL HIGH (ref 70–99)
Glucose-Capillary: 120 mg/dL — ABNORMAL HIGH (ref 70–99)
Glucose-Capillary: 128 mg/dL — ABNORMAL HIGH (ref 70–99)

## 2020-02-13 LAB — POCT I-STAT, CHEM 8
BUN: 17 mg/dL (ref 6–20)
Calcium, Ion: 0.71 mmol/L — CL (ref 1.15–1.40)
Chloride: 108 mmol/L (ref 98–111)
Creatinine, Ser: 1.1 mg/dL — ABNORMAL HIGH (ref 0.44–1.00)
Glucose, Bld: 187 mg/dL — ABNORMAL HIGH (ref 70–99)
HCT: 30 % — ABNORMAL LOW (ref 36.0–46.0)
Hemoglobin: 10.2 g/dL — ABNORMAL LOW (ref 12.0–15.0)
Potassium: 3.6 mmol/L (ref 3.5–5.1)
Sodium: 140 mmol/L (ref 135–145)
TCO2: 21 mmol/L — ABNORMAL LOW (ref 22–32)

## 2020-02-13 LAB — SURGICAL PCR SCREEN
MRSA, PCR: NEGATIVE
Staphylococcus aureus: NEGATIVE

## 2020-02-13 SURGERY — PACEMAKER IMPLANT

## 2020-02-13 MED ORDER — LIDOCAINE HCL 1 % IJ SOLN
INTRAMUSCULAR | Status: AC
  Filled 2020-02-13: qty 60

## 2020-02-13 MED ORDER — CEFAZOLIN SODIUM-DEXTROSE 1-4 GM/50ML-% IV SOLN
1.0000 g | Freq: Four times a day (QID) | INTRAVENOUS | Status: AC
  Administered 2020-02-13 – 2020-02-14 (×3): 1 g via INTRAVENOUS
  Filled 2020-02-13 (×3): qty 50

## 2020-02-13 MED ORDER — SODIUM CHLORIDE 0.9 % IV SOLN
INTRAVENOUS | Status: AC
  Filled 2020-02-13: qty 2

## 2020-02-13 MED ORDER — ONDANSETRON HCL 4 MG/2ML IJ SOLN: 4.0000 mg | Freq: Four times a day (QID) | INTRAMUSCULAR | Status: DC | PRN

## 2020-02-13 MED ORDER — CHLORHEXIDINE GLUCONATE 4 % EX LIQD
60.0000 mL | Freq: Once | CUTANEOUS | Status: AC
  Administered 2020-02-13: 4 via TOPICAL

## 2020-02-13 MED ORDER — LIDOCAINE HCL 1 % IJ SOLN
INTRAMUSCULAR | Status: AC
  Filled 2020-02-13: qty 20

## 2020-02-13 MED ORDER — CHLORHEXIDINE GLUCONATE 4 % EX LIQD
60.0000 mL | Freq: Once | CUTANEOUS | Status: AC
  Administered 2020-02-13: 4 via TOPICAL
  Filled 2020-02-13: qty 60

## 2020-02-13 MED ORDER — ACETAMINOPHEN 325 MG PO TABS
325.0000 mg | ORAL_TABLET | ORAL | Status: DC | PRN
  Administered 2020-02-18 – 2020-02-25 (×18): 650 mg via ORAL
  Filled 2020-02-13 (×18): qty 2

## 2020-02-13 MED ORDER — SODIUM CHLORIDE 0.9 % IV SOLN
80.0000 mg | INTRAVENOUS | Status: DC
  Filled 2020-02-13: qty 2

## 2020-02-13 MED ORDER — MIDAZOLAM HCL 5 MG/5ML IJ SOLN
INTRAMUSCULAR | Status: AC
  Filled 2020-02-13: qty 10

## 2020-02-13 MED ORDER — FENTANYL CITRATE (PF) 100 MCG/2ML IJ SOLN
INTRAMUSCULAR | Status: AC
  Filled 2020-02-13: qty 2

## 2020-02-13 MED ORDER — LIDOCAINE HCL (PF) 1 % IJ SOLN
INTRAMUSCULAR | Status: DC | PRN
  Administered 2020-02-13: 35 mL

## 2020-02-13 MED ORDER — CEFAZOLIN SODIUM-DEXTROSE 2-4 GM/100ML-% IV SOLN
2.0000 g | INTRAVENOUS | Status: AC
  Administered 2020-02-13: 2 g via INTRAVENOUS

## 2020-02-13 MED ORDER — FENTANYL CITRATE (PF) 100 MCG/2ML IJ SOLN
INTRAMUSCULAR | Status: DC | PRN
  Administered 2020-02-13 (×2): 25 ug via INTRAVENOUS

## 2020-02-13 MED ORDER — CEFAZOLIN SODIUM-DEXTROSE 2-4 GM/100ML-% IV SOLN
INTRAVENOUS | Status: AC
  Filled 2020-02-13: qty 100

## 2020-02-13 MED ORDER — SODIUM CHLORIDE 0.9 % IV SOLN
3.0000 g | Freq: Once | INTRAVENOUS | Status: AC
  Administered 2020-02-13: 3 g via INTRAVENOUS
  Filled 2020-02-13: qty 30

## 2020-02-13 MED ORDER — HEPARIN (PORCINE) IN NACL 1000-0.9 UT/500ML-% IV SOLN
INTRAVENOUS | Status: DC | PRN
  Administered 2020-02-13: 500 mL

## 2020-02-13 MED ORDER — LOPERAMIDE HCL 2 MG PO CAPS
4.0000 mg | ORAL_CAPSULE | ORAL | Status: DC | PRN
  Administered 2020-02-13 – 2020-02-14 (×3): 4 mg via ORAL
  Filled 2020-02-13 (×3): qty 2

## 2020-02-13 MED ORDER — SODIUM CHLORIDE 0.9 % IV SOLN
INTRAVENOUS | Status: DC | PRN
  Administered 2020-02-13: 17:00:00 500 mL

## 2020-02-13 MED ORDER — MIDAZOLAM HCL 5 MG/5ML IJ SOLN
INTRAMUSCULAR | Status: DC | PRN
  Administered 2020-02-13: 1 mg via INTRAVENOUS
  Administered 2020-02-13: 2 mg via INTRAVENOUS

## 2020-02-13 MED ORDER — MELATONIN 3 MG PO TABS
3.0000 mg | ORAL_TABLET | Freq: Every evening | ORAL | Status: DC | PRN
  Administered 2020-02-14 – 2020-02-25 (×7): 3 mg via ORAL
  Filled 2020-02-13 (×10): qty 1

## 2020-02-13 MED ORDER — SODIUM CHLORIDE 0.9 % IV SOLN: INTRAVENOUS | Status: DC

## 2020-02-13 SURGICAL SUPPLY — 7 items
CABLE SURGICAL S-101-97-12 (CABLE) ×2 IMPLANT
LEAD TENDRIL MRI 52CM LPA1200M (Lead) ×1 IMPLANT
LEAD TENDRIL MRI 58CM LPA1200M (Lead) ×1 IMPLANT
PACEMAKER ASSURITY DR-RF (Pacemaker) ×1 IMPLANT
PAD PRO RADIOLUCENT 2001M-C (PAD) ×2 IMPLANT
SHEATH 8FR PRELUDE SNAP 13 (SHEATH) ×2 IMPLANT
TRAY PACEMAKER INSERTION (PACKS) ×2 IMPLANT

## 2020-02-13 NOTE — Progress Notes (Addendum)
NAME:  Sarah Long, MRN:  [id number], DOB:  1971/04/11, LOS: 3 ADMISSION DATE:  02/09/2020, CONSULTATION DATE:  02/10/2020 REFERRING MD:  Dina Rich MD, CHIEF COMPLAINT:  CHEST PAIN   Brief History   49 yr old F w/ PMHx HTN and DM presents in 10/10 chest pain with elevated troponin 730 and in complete heart block. BG 893 mg/dl with AG 31. Seen by Cardiology and taken for LHC and TVP.  ED Provider Horton MD asked PCCM to admit.  History of present illness   (History obtained from EMR and acct of other providers due to pt being intubated)  49 yr old obese F w/ significant PMHx HTN, HLD and IDDM presented to [facility] with 10/10 chest pain. Per the triage note pt reported vomiting all day, being short of breath and having severe chest pain feeling like she was going to pass out. She denied loss of consciousness or syncopal episode.   Her initial Vitals were 133/75mmHg HR 41 RR 26 O2 sat 94% . Her CBG at 2358 was >600mg /dl  EKG showed an  Idioventricular rhythm with Left axis deviation conduction block, LVH and an inferior infarct (QRS 162 ms and QT/QTc 574/467 ms).  Troponin 730.   Then at 0013 pt went into complete heart block. BP 97/61 HR 39 bpm Received  Versed 4 mg at 0018  And a heparin gtt was started then pt was externally paced at 0020. BP improved. Per documentation pt became somnolent post versed and required endotracheally intubation 0046 and started on external pacing. Cardiology evaluated pt took to the Cath lab for LHC and TVP. Past Medical History  .[name] Active Ambulatory Problems    Diagnosis Date Noted  . Hypercholesteremia 02/02/2007  . Bipolar 1 disorder ([place]) 02/02/2007  . HYPERTENSION, BENIGN SYSTEMIC 02/02/2007  . Smoking 11/26/2013  . Diabetes type 2, uncontrolled ([place]) 10/21/2016  . 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 block) 05/29/2019  . Bilateral bunions 05/29/2019  . Chronic right shoulder pain 05/29/2019  . DM type 2 with diabetic peripheral  neuropathy ([place]) 05/30/2019  . Dysmenorrhea 10/02/2019   Resolved Ambulatory Problems    Diagnosis Date Noted  . Diabetes ([place]) 02/02/2007  . Threatened preterm labor 06/21/2011  . Diabetes mellitus complicating pregnancy, childbirth, or the puerperium 07/21/2011  . Irregular uterine bleeding 04/04/2012  . Vaginal yeast infection 10/21/2016  . Swelling of left ring finger 10/21/2016  . Blister of second toe of left foot 11/21/2016  . Sudden idiopathic hearing loss of left ear with unrestricted hearing of right ear 01/06/2018  . Holosystolic murmur [id number]   Past Medical History:  Diagnosis Date  . Abnormal Pap smear 1998  . Abnormal vaginal bleeding 12/19/2011  . Bacterial infection   . Candida vaginitis 07/2007  . Depression   . Diabetes in pregnancy   . Diabetes mellitus   . Fibroid   . Galactorrhea of right breast 2008  . H/O amenorrhea 07/2007  . H/O dizziness 10/14/11  . H/O dysmenorrhea 2010  . H/O menorrhagia 10/14/11  . H/O varicella   . Headache(784.0)   . Heavy vaginal bleeding due to contraceptive injection use 10/12/2011  . Herpes   . HSV-2 infection 01/03/2009  . Hx: UTI (urinary tract infection) 2009  . Hypertension   . Increased BMI 2010  . Obesity 10/14/11  . Oligomenorrhea 07/2007  . Pelvic pain in female   . Preterm labor   . Trichomonas   . Yeast infection  Significant Hospital Events   Complete Heart block--> Cardiology evaluated --> going to Cath Lab  Consults:  02/10/2020>> Cardiology 02/10/2020>> PCCM  Procedures:  02/10/2020>>Endotracheal intubation in ED 02/10/2020>> left heart cath, normal coronaries.  TVP placed  Significant Diagnostic Tests:   EKG at presentation-sinus tachycardia with complete heart block and Idioventricular rhythm Left axis deviation Non-specific intra-ventricular conduction block Left ventricular hypertrophy ( Cornell product , Romhilt-Estes ) Possible Lateral infarct , age undetermined Abnormal ECG  Last EKG  prior to Cath Lab Vent. rate 62 BPM QRS duration 166 ms QT/QTc 580/590 ms P-R-T axes * 105 -72 Consider left ventricular hypertrophy Repol abnrm, severe global ischemia (LM/MVD) Prolonged QT interval Baseline wander in lead(s) II III aVR aVL aVF V2 V3 V4 V5 V6  ECHO 02/10/2020 LVEF 40 to 45%, mildly decreased LV function with global hypokinesis.  Severe LVH.  Normal LA, RV, RA.  Mild MR, mild TR, normal aortic valve.  CT chest abd pelvis- LL dependent pneumonia bilaterally. Peripancreatic inflammation. No Ileus or dilated loops of bowel.   Micro Data:  Blood 3/7>>  Covid PCR negative  Antimicrobials:  Unasyn 3/8>>  Ancef 3/10 preop Gent irrigation 3/10 preop   Interim history:  Off pressors  Remains V paced. Plan for pacemaker today with EP  NAEO   Objective   Blood pressure 111/74, pulse 80, temperature 97.8 F (36.6 C), temperature source Oral, resp. rate (!) 27, height 5\' 4"  (1.626 m), weight 102 kg, last menstrual period 02/07/2020, SpO2 97 %.        Intake/Output Summary (Last 24 hours) at 02/13/2020 0909 Last data filed at 02/13/2020 0800 Gross per 24 hour  Intake 2089.48 ml  Output 100 ml  Net 1989.48 ml   Filed Weights   02/11/20 0500 02/12/20 0500 02/13/20 0500  Weight: 100.2 kg 106.5 kg 102 kg    Examination: General: WDWN obese middle aged F reclined in bed NAD  HENT: NCAT pink mm. Anicteric sclera. Trachea midline  Lungs: CTA bilaterally. Symmetrical chest expansion. Even unlabored respirations  Cardiovascular: V paced rhythm, Cap refill < 3 seconds. Radial pulses 2+  Abdomen: obese soft round ndnt. + bowel sounds  Extremities: Symmetrical bulk and tone no obvious joint deformity. No cyanosis or clubbing  Neuro: AAOx4 following commands no deficit  Skin: c/d/w/i without rash    Assessment & Plan:  Complete Heart Block; no ACS despite elevated troponin on presentation. LHC with normal coronary arteries. H/o HTN, HLD and tobacco use. TVP in place-  dependent on today's exam per cardiology.  Appears to have advanced conduction disease with right bundle branch block and left anterior fascicular block.  She may require permanent pacemaker placement. P -TVP currently -EP to place pacemaker 3/10  Acute hypoxic respiratory failure, improved  -Due to decompensated heart failure.   Possible aspiration pneumonitis versus pneumonia in lower lobes. P -IS, pulmonary hygiene -Unasyn x7 days for likely aspiration pneumonia  Mild DKA, AG improved  DM -transitioned off insulin gtt 3/9 Plan: -on basal + SSI insuling -continues on D5NS while NPO for pacemaker. Dc afterward when able to take PO   Acute Kidney Injury-improving Pt has a h/o DM  Combination of pre -renal + diabetic nephropathy Plan: - trend UOP, renal indices - minimize nephrotoxins as able   Pancreatitis-cause unknown. Latuda and ARBs have been associated with this.  Has associated hypocalcemia. -Pain control as needed -Enteral intake after pacemaker   Hypocalcemia -likely in setting of ? Pancreatitis  P -3g calcium glu on 3/10  -  continue to trend AM ionized cal, replace PRN   Hyponatremia, mild and asymptomatic, improved  Plan: -Monitor BMP  Macrocytosis on Labs MCV 103 No prior h/o ETOH documented Plan: -Continue supplements  Best practice:  Diet: NPO   Pain/Anxiety/Delirium protocol (if indicated): na VAP protocol (if indicated): yes DVT prophylaxis: Heparin GI prophylaxis: PPI Glucose control: Basal + SSI  Mobility: Bedrest Code Status: FULL Family Communication: patient updated 3/10 Disposition: Currently V Paced and as such remains in ICU. Stable otherwise. Going for pacemaker today. Will transfer out of ICU after pacemaker is placed, and ask TRH to take over care starting 3/11 with PCCM signing off.   Labs   CBC: Recent Labs  Lab 02/10/20 0002 02/10/20 0039 02/10/20 0344 02/10/20 0344 02/10/20 0553 02/10/20 1213 02/11/20 0436 02/13/20 0437  02/13/20 0551  WBC 13.0*  --  14.2*  --   --   --  13.4* 10.3  --   NEUTROABS  --   --   --   --   --   --  10.0*  --   --   HGB 11.8*   < > 11.7*   < > 10.5* 10.5* 11.4* 9.4* 10.2*  HCT 41.0   < > 38.2   < > 31.0* 31.0* 33.9* 29.4* 30.0*  MCV 103.8*  --  97.7  --   --   --  89.7 93.3  --   PLT 255  --  207  --   --   --  204 159  --    < > = values in this interval not displayed.    Basic Metabolic Panel: Recent Labs  Lab 02/10/20 1805 02/10/20 2153 02/11/20 0436 02/11/20 0816 02/11/20 2100 02/11/20 2100 02/12/20 0027 02/12/20 0529 02/12/20 [id number] 02/13/20 0437 02/13/20 0551 02/13/20 0559  NA   < >  --  133*   < > 134*   < > 133* 134* 137 140 140  --   K   < >  --  4.0   < > 3.9   < > 3.6 3.4* 3.5 3.7 3.6  --   CL   < >  --  103   < > 107   < > 105 105 108 108 108  --   CO2   < >  --  17*   < > 18*  --  17* 18* 20* 20*  --   --   GLUCOSE   < >  --  144*   < > 194*   < > 178* 152* 148* 208* 187*  --   BUN   < >  --  27*   < > 27*   < > 28* 26* 23* 19 17  --   CREATININE   < >  --  1.91*   < > 1.62*   < > 1.59* 1.62* 1.40* 1.27* 1.10*  --   CALCIUM   < >  --  6.6*   < > 6.2*  --  6.2* 6.2* 5.9* 5.3*  --   --   MG  --  1.2* 1.8  --   --   --   --   --  1.7  --   --  2.2  PHOS  --  2.7 3.1  --   --   --   --   --   --   --   --   --    < > = values in this interval  not displayed.   GFR: Estimated Creatinine Clearance: 74.3 mL/min (A) (by C-G formula based on SCr of 1.1 mg/dL (H)). Recent Labs  Lab 02/10/20 0002 02/10/20 0344 02/10/20 0700 02/10/20 2153 02/11/20 0436 02/11/20 1543 02/13/20 0437  WBC 13.0* 14.2*  --   --  13.4*  --  10.3  LATICACIDVEN  --  >11.0* 7.9* 1.9  --  2.1*  --     Liver Function Tests: Recent Labs  Lab 02/10/20 0700 02/10/20 2019 02/11/20 0436  AST 736* 582*  --   ALT 275* 294*  --   ALKPHOS 68 58  --   BILITOT 1.0 0.7  --   PROT 5.5* 5.5*  --   ALBUMIN 2.6* 2.5* 2.3*   Recent Labs  Lab 02/10/20 2153  LIPASE 1,879*   No results  for input(s): AMMONIA in the last 168 hours.  ABG    Component Value Date/Time   PHART 7.296 (L) 02/10/2020 1213   PCO2ART 36.7 02/10/2020 1213   PO2ART 96.0 02/10/2020 1213   HCO3 17.8 (L) 02/10/2020 1213   TCO2 21 (L) 02/13/2020 0551   ACIDBASEDEF 8.0 (H) 02/10/2020 1213   O2SAT 97.0 02/10/2020 1213     Coagulation Profile: No results for input(s): INR, PROTIME in the last 168 hours.  Cardiac Enzymes: No results for input(s): CKTOTAL, CKMB, CKMBINDEX, TROPONINI in the last 168 hours.  HbA1C: Hgb A1c MFr Bld  Date/Time Value Ref Range Status  02/11/2020 04:36 AM 12.7 (H) 4.8 - 5.6 % Final    Comment:    (NOTE) Pre diabetes:          5.7%-6.4% Diabetes:              >6.4% Glycemic control for   <7.0% adults with diabetes   02/10/2020 08:19 PM 12.9 (H) 4.8 - 5.6 % Final    Comment:    (NOTE) Pre diabetes:          5.7%-6.4% Diabetes:              >6.4% Glycemic control for   <7.0% adults with diabetes     CBG: Recent Labs  Lab 02/12/20 1623 02/12/20 1931 02/12/20 2325 02/13/20 0319 02/13/20 0850  GLUCAP 135* 172* 198* 185* 154*     Eliseo Gum MSN, AGACNP-BC [place] [id number] If no answer, [id number] 02/13/2020, 9:09 AM    PCCM attending:  49 year old female admitted for complete heart block, cardiogenic shock, left heart cath clear with normal coronaries found to be in DKA with severe metabolic acidosis.  Also echocardiogram showing speckled pattern with thick LV.  Discussed with EP at bedside question of possibility of underlying amyloidosis as cause.  Plans at this point for placement of pacemaker which is MRI compatible.  Once she is healed from this will have cardiac MRI and follow-up in the cardiology clinic.  Patient at this time stable for pacemaker placement and then transfer to the cardiac telemetry floor.  Pacemaker placement planned for this afternoon.  BP (!) 118/107   Pulse 80   Temp 97.8 F (36.6 C)  (Oral)   Resp (!) 27   Ht 5\' 4"  (1.626 m)   Wt 102 kg   LMP 02/07/2020 Comment: negative preg test  SpO2 97%   BMI 38.60 kg/m   General: Obese young female resting in bed no distress lying back flat with TVP in place. Neck: Large Heart: Regular rhythm S1-S2 Lungs: Clear to auscultation bilaterally no rales no wheeze Abdomen: Obese soft nontender  nondistended  Labs:Potassium 3.4 magnesium 2.2 glucose 187  Chest x-ray: Right lower lobe pneumonia CT abdomen evidence of pancreatitis on abdominal imaging  Assessment: Complete heart block, TVP, unclear etiology, thick LV questionable amyloidosis as underlying cause - Plans for pacemaker placement cardiac MRI at some point discussed with EP. Cardiogenic shock, resolved Acute hypoxic respiratory failure requiring intubation mechanical ventilation, now resolved likely aspiration pneumonia lower lobe pneumonia on chest to come DKA, anion gap metabolic acidosis, resolved AKI resolved  Plan: [place] cardiology electrophysiology input. At this time patient waiting in intensive care unit with TVP in place until pacemaker can be inserted. Following this patient potentially stable for transfer from the intensive care unit pending postop care needs. Need to follow electrolytes replete as needed  This patient is critically ill with multiple organ system failure; which, requires frequent high complexity decision making, assessment, support, evaluation, and titration of therapies. This was completed through the application of advanced monitoring technologies and extensive interpretation of multiple databases. During this encounter critical care time was devoted to patient care services described in this note for 32 minutes.  Garner Nash, DO [facility] 02/13/2020 11:44 AM

## 2020-02-13 NOTE — Progress Notes (Signed)
eLink Physician-Brief Progress Note Patient Name: KYNADEE MCGLATHERY DOB: 12/16/1970 MRN: [id number]   Date of Service  02/13/2020  HPI/Events of Note  Serum calcium 5.3, ionized calcium 0.71  eICU Interventions  Calcium gluconate 3 gm iv bolus, check magnesium level        Alinah Sheard U Jya Hughston 02/13/2020, 5:58 AM

## 2020-02-13 NOTE — Progress Notes (Signed)
eLink Physician-Brief Progress Note Patient Name: Sarah Long DOB: November 01, 1971 MRN: [id number]   Date of Service  02/13/2020  HPI/Events of Note  Patient requests sleeping aid.  eICU Interventions  Will order: 1. Melatonin 3 mg PO Q HS PRN sleep. Nurse will have to call pharmacy to enter order.      Intervention Category Major Interventions: Other:  Lysle Dingwall 02/13/2020, 9:21 PM

## 2020-02-13 NOTE — H&P (Signed)
Sarah Long has presented today for surgery, with the diagnosis of complete AV block.  The various methods of treatment have been discussed with the patient and family. After consideration of risks, benefits and other options for treatment, the patient has consented to  Procedure(s): Pacemaker implant as a surgical intervention .  Risks include but not limited to bleeding, tamponade, infection, pneumothorax, among others. The patient's history has been reviewed, patient examined, no change in status, stable for surgery.  I have reviewed the patient's chart and labs.  Questions were answered to the patient's satisfaction.    Zeola Brys Curt Bears, MD 02/13/2020 8:43 AM

## 2020-02-13 NOTE — Progress Notes (Addendum)
Electrophysiology Rounding Note  Patient Name: Sarah Long Date of Encounter: 02/13/2020  Primary Cardiologist: Fransico Him, MD Electrophysiologist: New   Subjective   The patient is doing OK this am. She remains NPO with pancreatitis. Temp wire remains in place and stable.  At this time, the patient denies chest pain, shortness of breath, or any new concerns.  Inpatient Medications    Scheduled Meds:  aspirin  81 mg Per NG tube Daily   atorvastatin  80 mg Per NG tube q1800   Chlorhexidine Gluconate Cloth  6 each Topical Daily   folic acid  1 mg Intravenous Daily   gentamicin irrigation  80 mg Irrigation On Call   heparin  5,000 Units Subcutaneous Q8H   insulin aspart  0-15 Units Subcutaneous Q4H   insulin glargine  30 Units Subcutaneous BID   pantoprazole (PROTONIX) IV  40 mg Intravenous QHS   sodium chloride flush  10-40 mL Intracatheter Q12H   sodium chloride flush  3 mL Intravenous Q12H   thiamine injection  100 mg Intravenous Q24H   Continuous Infusions:  sodium chloride 10 mL/hr at 02/13/20 0400   sodium chloride     sodium chloride Stopped (02/12/20 2038)   sodium chloride 50 mL/hr at 02/13/20 0615   ampicillin-sulbactam (UNASYN) IV 3 g (02/13/20 0426)   calcium gluconate 3 g (02/13/20 0646)    ceFAZolin (ANCEF) IV     dextrose 5 % and 0.9% NaCl 50 mL/hr at 02/13/20 0400   insulin Stopped (02/12/20 1150)   phenylephrine (NEO-SYNEPHRINE) Adult infusion Stopped (02/12/20 1024)   PRN Meds: sodium chloride, Place/Maintain arterial line **AND** sodium chloride, sodium chloride, acetaminophen, dextrose, fentaNYL (SUBLIMAZE) injection, ondansetron (ZOFRAN) IV, sodium chloride flush, sodium chloride flush   Vital Signs    Vitals:   02/13/20 0300 02/13/20 0320 02/13/20 0400 02/13/20 0500  BP: 118/77  134/87   Pulse: 73  80   Resp: (!) 24  18   Temp:  97.8 F (36.6 C)    TempSrc:  Oral    SpO2: 96%  97%   Weight:    102 kg  Height:        Intake/Output  Summary (Last 24 hours) at 02/13/2020 0745 Last data filed at 02/13/2020 0400 Gross per 24 hour  Intake 1886.06 ml  Output 150 ml  Net 1736.06 ml   Filed Weights   02/11/20 0500 02/12/20 0500 02/13/20 0500  Weight: 100.2 kg 106.5 kg 102 kg    Physical Exam    GEN- The patient is well appearing, alert and oriented x 3 today.   Head- normocephalic, atraumatic Eyes-  Sclera clear, conjunctiva pink Ears- hearing intact Oropharynx- clear Neck- supple Lungs- Clear to ausculation bilaterally, normal work of breathing Heart- Regular rate and rhythm, no murmurs, rubs or gallops GI- soft, NT, ND, + BS Extremities- no clubbing, cyanosis, or edema Skin- no rash or lesion Psych- euthymic mood, full affect Neuro- strength and sensation are intact  Labs    CBC Recent Labs    02/11/20 0436 02/11/20 0436 02/13/20 0437 02/13/20 0551  WBC 13.4*  --  10.3  --   NEUTROABS 10.0*  --   --   --   HGB 11.4*   < > 9.4* 10.2*  HCT 33.9*   < > 29.4* 30.0*  MCV 89.7  --  93.3  --   PLT 204  --  159  --    < > = values in this interval not displayed.  Basic Metabolic Panel Recent Labs    02/10/20 1805 02/10/20 2153 02/11/20 0436 02/11/20 0816 02/12/20 0938 02/12/20 0938 02/13/20 0437 02/13/20 0551 02/13/20 0559  NA   < >  --  133*   < > 137   < > 140 140  --   K   < >  --  4.0   < > 3.5   < > 3.7 3.6  --   CL   < >  --  103   < > 108   < > 108 108  --   CO2   < >  --  17*   < > 20*  --  20*  --   --   GLUCOSE   < >  --  144*   < > 148*   < > 208* 187*  --   BUN   < >  --  27*   < > 23*   < > 19 17  --   CREATININE   < >  --  1.91*   < > 1.40*   < > 1.27* 1.10*  --   CALCIUM   < >  --  6.6*   < > 5.9*  --  5.3*  --   --   MG   < > 1.2* 1.8   < > 1.7  --   --   --  2.2  PHOS  --  2.7 3.1  --   --   --   --   --   --    < > = values in this interval not displayed.   Liver Function Tests Recent Labs    02/10/20 2019 02/11/20 0436  AST 582*  --   ALT 294*  --   ALKPHOS 58  --    BILITOT 0.7  --   PROT 5.5*  --   ALBUMIN 2.5* 2.3*   Recent Labs    02/10/20 2153  LIPASE 1,879*   Cardiac Enzymes No results for input(s): CKTOTAL, CKMB, CKMBINDEX, TROPONINI in the last 72 hours.   Telemetry    V paced at 80, underlying is CHB with escape at 38-39 (personally reviewed)  Radiology    No results found.  Patient Profile     Sarah Long is a 49 y.o. female with a past medical istory of poorly controlled DM (unable to afford meds), HTN, HLD, and ongoing tobacco abuse who is being seen today for the evaluation of CHB at the request of Dr. Marlou Porch.  Assessment & Plan    1. CHB Which has not resolved with continued correction of her metabolic abnormalities.  Calcium remains low, but otherwise relatively compensated.  She has longstanding bigascicular block and severe LVH with speckled appearance on Echo concerning for infiltrative process. Suspect she Sarah Long need permanent pacing. Discussed goals, risks, and benefits of pacemaker implantation with the patient including risks of bleeding, infection, pneumothorax, pericardial effusion, heart attack, stroke, or death. She verbalizes understanding and agrees to proceed if indicated.  2. ARF Precludes consideration of cMRI at this time.   3. Shock Improving. No evidence of infectious process. + Pancreatitis improving. She has not eaten yet.   4. Acute hypoxic respiratory failure Extubated and stable.   Sarah Long discuss with MD this am for consideration of pacing, possibly as early as this afternoon. She is NPO.   For questions or updates, please contact [place] Please consult [email or web address] for contact info under Cardiology/STEMI.  Signed, Satira Mccallum  [place], PA-C  02/13/2020, 7:45 AM   I have seen and examined this patient with Oda Kilts.  Agree with above, note added to reflect my findings.  On exam, RRR, no murmurs, lungs clear.  Admitted with DKA and pancreatitis, found to be in complete heart  block and now has a temporary pacing wire in place.  Her electrolyte abnormalities have since resolved.  She remains in complete heart block, dependent on her pacing wire.  Due to that, we Korion Cuevas plan for pacemaker implant.  Sarah Long has presented today for surgery, with the diagnosis of complete AV block.  The various methods of treatment have been discussed with the patient and family. After consideration of risks, benefits and other options for treatment, the patient has consented to  Procedure(s): Pacemaker implant as a surgical intervention .  Risks include but not limited to bleeding, tamponade, infection, pneumothorax, among others. The patient's history has been reviewed, patient examined, no change in status, stable for surgery.  I have reviewed the patient's chart and labs.  Questions were answered to the patient's satisfaction.    Sarah Tamez Curt Bears, MD 02/13/2020 8:42 AM

## 2020-02-13 NOTE — Progress Notes (Signed)
Chaplain engaged in initial visit with Sarah Long.  Meghin shared a picture of her children with chaplain, who bring her great joy.  She also noted that it has been hard to sleep in the hospital.  Chaplain provided therapeutic listening.  Chaplain offered prayer over Mrs. Keys because she noted she has a procedure later today and she accepted.  Chaplain and Mrs. Rumford prayed together.  Chaplain will follow-up.

## 2020-02-13 NOTE — Progress Notes (Signed)
PT Cancellation Note  Patient Details Name: AMARILYS MEEKER MRN: [id number] DOB: 1971/10/07   Cancelled Treatment:    Reason Eval/Treat Not Completed: Medical issues which prohibited therapy. Pt remains with femoral access for pacing. RN reports patient potentially going for permanent pacer placement today. PT will attempt to follow up when patient is appropriate for mobilization with PT and without femoral access site.   Zenaida Niece 02/13/2020, 2:24 PM

## 2020-02-14 ENCOUNTER — Inpatient Hospital Stay (HOSPITAL_COMMUNITY): Payer: Medicaid Other

## 2020-02-14 DIAGNOSIS — D649 Anemia, unspecified: Secondary | ICD-10-CM

## 2020-02-14 DIAGNOSIS — I5021 Acute systolic (congestive) heart failure: Secondary | ICD-10-CM

## 2020-02-14 DIAGNOSIS — J690 Pneumonitis due to inhalation of food and vomit: Secondary | ICD-10-CM

## 2020-02-14 DIAGNOSIS — R748 Abnormal levels of other serum enzymes: Secondary | ICD-10-CM

## 2020-02-14 LAB — BASIC METABOLIC PANEL
Anion gap: 10 (ref 5–15)
BUN: 12 mg/dL (ref 6–20)
CO2: 20 mmol/L — ABNORMAL LOW (ref 22–32)
Calcium: 4.9 mg/dL — CL (ref 8.9–10.3)
Chloride: 109 mmol/L (ref 98–111)
Creatinine, Ser: 1.21 mg/dL — ABNORMAL HIGH (ref 0.44–1.00)
GFR calc Af Amer: >60 mL/min (ref >60)
GFR calc non Af Amer: 54 mL/min — ABNORMAL LOW (ref >60)
Glucose, Bld: 81 mg/dL (ref 70–99)
Potassium: 3.4 mmol/L — ABNORMAL LOW (ref 3.5–5.1)
Sodium: 139 mmol/L (ref 135–145)

## 2020-02-14 LAB — GLUCOSE, CAPILLARY
Glucose-Capillary: 104 mg/dL — ABNORMAL HIGH (ref 70–99)
Glucose-Capillary: 115 mg/dL — ABNORMAL HIGH (ref 70–99)
Glucose-Capillary: 57 mg/dL — ABNORMAL LOW (ref 70–99)
Glucose-Capillary: 61 mg/dL — ABNORMAL LOW (ref 70–99)
Glucose-Capillary: 73 mg/dL (ref 70–99)
Glucose-Capillary: 83 mg/dL (ref 70–99)
Glucose-Capillary: 83 mg/dL (ref 70–99)
Glucose-Capillary: 90 mg/dL (ref 70–99)
Glucose-Capillary: 98 mg/dL (ref 70–99)

## 2020-02-14 LAB — HEPATIC FUNCTION PANEL
ALT: 337 U/L — ABNORMAL HIGH (ref 0–44)
AST: 243 U/L — ABNORMAL HIGH (ref 15–41)
Albumin: 1.9 g/dL — ABNORMAL LOW (ref 3.5–5.0)
Alkaline Phosphatase: 103 U/L (ref 38–126)
Bilirubin, Direct: 0.2 mg/dL (ref 0.0–0.2)
Indirect Bilirubin: 0.4 mg/dL (ref 0.3–0.9)
Total Bilirubin: 0.6 mg/dL (ref 0.3–1.2)
Total Protein: 5.8 g/dL — ABNORMAL LOW (ref 6.5–8.1)

## 2020-02-14 LAB — LIPASE, BLOOD: Lipase: 941 U/L — ABNORMAL HIGH (ref 11–51)

## 2020-02-14 LAB — CALCIUM, IONIZED: Calcium, Ionized, Serum: 3.1 mg/dL — ABNORMAL LOW (ref 4.5–5.6)

## 2020-02-14 LAB — CK: Total CK: 855 U/L — ABNORMAL HIGH (ref 38–234)

## 2020-02-14 MED ORDER — FOLIC ACID 1 MG PO TABS
1.0000 mg | ORAL_TABLET | Freq: Every day | ORAL | Status: DC
  Administered 2020-02-14 – 2020-02-26 (×13): 1 mg via ORAL
  Filled 2020-02-14 (×14): qty 1

## 2020-02-14 MED ORDER — AMOXICILLIN-POT CLAVULANATE 875-125 MG PO TABS
1.0000 | ORAL_TABLET | Freq: Two times a day (BID) | ORAL | Status: DC
  Administered 2020-02-14 – 2020-02-16 (×5): 1 via ORAL
  Filled 2020-02-14 (×6): qty 1

## 2020-02-14 MED ORDER — POTASSIUM CHLORIDE CRYS ER 20 MEQ PO TBCR
40.0000 meq | EXTENDED_RELEASE_TABLET | ORAL | Status: AC
  Administered 2020-02-14 (×2): 40 meq via ORAL
  Filled 2020-02-14 (×2): qty 2

## 2020-02-14 MED ORDER — SODIUM CHLORIDE 0.9 % IV SOLN: INTRAVENOUS | Status: DC

## 2020-02-14 MED ORDER — PANTOPRAZOLE SODIUM 40 MG PO TBEC
40.0000 mg | DELAYED_RELEASE_TABLET | Freq: Every day | ORAL | Status: DC
  Administered 2020-02-14 – 2020-02-26 (×13): 40 mg via ORAL
  Filled 2020-02-14 (×13): qty 1

## 2020-02-14 MED ORDER — INSULIN DETEMIR 100 UNIT/ML ~~LOC~~ SOLN
25.0000 [IU] | Freq: Two times a day (BID) | SUBCUTANEOUS | Status: DC
  Filled 2020-02-14 (×3): qty 0.25

## 2020-02-14 MED ORDER — ATORVASTATIN CALCIUM 80 MG PO TABS
80.0000 mg | ORAL_TABLET | Freq: Every day | ORAL | Status: DC
  Administered 2020-02-14: 80 mg via ORAL
  Filled 2020-02-14: qty 1

## 2020-02-14 MED ORDER — ASPIRIN 81 MG PO CHEW
81.0000 mg | CHEWABLE_TABLET | Freq: Every day | ORAL | Status: DC
  Administered 2020-02-15 – 2020-02-26 (×12): 81 mg via ORAL
  Filled 2020-02-14 (×12): qty 1

## 2020-02-14 MED ORDER — SODIUM CHLORIDE 0.9 % IV SOLN
3.0000 g | Freq: Once | INTRAVENOUS | Status: AC
  Administered 2020-02-14: 3 g via INTRAVENOUS
  Filled 2020-02-14: qty 30

## 2020-02-14 MED ORDER — INSULIN ASPART 100 UNIT/ML ~~LOC~~ SOLN: 0.0000 [IU] | Freq: Three times a day (TID) | SUBCUTANEOUS | Status: DC

## 2020-02-14 MED ORDER — INSULIN ASPART 100 UNIT/ML ~~LOC~~ SOLN: 0.0000 [IU] | Freq: Every day | SUBCUTANEOUS | Status: DC

## 2020-02-14 MED FILL — Lidocaine HCl Local Inj 1%: INTRAMUSCULAR | Qty: 80 | Status: AC

## 2020-02-14 NOTE — Progress Notes (Signed)
Results for NEMA, AGUILLAR (MRN [id number]) as of 02/14/2020 11:27  Ref. Range 02/13/2020 20:13 02/14/2020 01:47 02/14/2020 05:19 02/14/2020 06:36 02/14/2020 07:55  Glucose-Capillary Latest Ref Range: 70 - 99 mg/dL 128 (H) 90 61 (L) 104 (H) 115 (H)  Noted that patient had a low CBG of 61 mg/dl at 05:19 today.  Recommend decreasing Lantus to 25 units BID and continuing Novolog MODERATE correction scale, but change to TID since patient has started a diet today. May want to add Novolog HS scale.   Harvel Ricks RN BSN CDE Diabetes Coordinator Pager: [phone]  8am-5pm

## 2020-02-14 NOTE — Progress Notes (Addendum)
Electrophysiology Rounding Note  Patient Name: Sarah Long Date of Encounter: 02/14/2020  Primary Cardiologist: Radford Pax Electrophysiologist: Curt Bears    Subjective   The patient is doing well today.  At this time, the patient denies chest pain, shortness of breath, or any new concerns.  Inpatient Medications    Scheduled Meds:  aspirin  81 mg Per NG tube Daily   atorvastatin  80 mg Per NG tube q1800   Chlorhexidine Gluconate Cloth  6 each Topical Daily   folic acid  1 mg Intravenous Daily   heparin  5,000 Units Subcutaneous Q8H   insulin aspart  0-15 Units Subcutaneous Q4H   insulin glargine  30 Units Subcutaneous BID   pantoprazole (PROTONIX) IV  40 mg Intravenous QHS   potassium chloride  40 mEq Oral Q4H   sodium chloride flush  10-40 mL Intracatheter Q12H   sodium chloride flush  3 mL Intravenous Q12H   Continuous Infusions:  sodium chloride Stopped (02/13/20 0820)   sodium chloride     sodium chloride 250 mL (02/14/20 0043)   ampicillin-sulbactam (UNASYN) IV 3 g (02/14/20 0628)   calcium gluconate      ceFAZolin (ANCEF) IV 1 g (02/14/20 0150)   PRN Meds: sodium chloride, Place/Maintain arterial line **AND** sodium chloride, sodium chloride, acetaminophen, dextrose, fentaNYL (SUBLIMAZE) injection, loperamide, Melatonin, ondansetron (ZOFRAN) IV, sodium chloride flush, sodium chloride flush   Vital Signs    Vitals:   02/13/20 1830 02/13/20 1900 02/13/20 1930 02/14/20 0527  BP: (!) 151/92 (!) 151/97 (!) 157/75 130/76  Pulse:    91  Resp:    18  Temp:    (!) 97.4 F (36.3 C)  TempSrc:    Oral  SpO2:    98%  Weight:      Height:        Intake/Output Summary (Last 24 hours) at 02/14/2020 0802 Last data filed at 02/14/2020 0622 Gross per 24 hour  Intake 2171.41 ml  Output 500 ml  Net 1671.41 ml   Filed Weights   02/11/20 0500 02/12/20 0500 02/13/20 0500  Weight: 100.2 kg 106.5 kg 102 kg    Physical Exam    GEN- The patient is well appearing, alert and  oriented x 3 today.   Head- normocephalic, atraumatic Eyes-  Sclera clear, conjunctiva pink Ears- hearing intact Oropharynx- clear Neck- supple Lungs- Clear to ausculation bilaterally, normal work of breathing Heart- Regular rate and rhythm (paced) GI- soft, NT, ND, + BS Extremities- no clubbing, cyanosis, or edema, left chest without hematoma/ecchymosis  Skin- no rash or lesion Psych- euthymic mood, full affect Neuro- strength and sensation are intact  Labs    CBC Recent Labs    02/13/20 0437 02/13/20 0551  WBC 10.3  --   HGB 9.4* 10.2*  HCT 29.4* 30.0*  MCV 93.3  --   PLT 159  --    Basic Metabolic Panel Recent Labs    02/12/20 0938 02/12/20 0938 02/13/20 0437 02/13/20 0437 02/13/20 0551 02/13/20 0559 02/14/20 0416  NA 137   < > 140   < > 140  --  139  K 3.5   < > 3.7   < > 3.6  --  3.4*  CL 108   < > 108   < > 108  --  109  CO2 20*   < > 20*  --   --   --  20*  GLUCOSE 148*   < > 208*   < > 187*  --  81  BUN 23*   < > 19   < > 17  --  12  CREATININE 1.40*   < > 1.27*   < > 1.10*  --  1.21*  CALCIUM 5.9*   < > 5.3*  --   --   --  4.9*  MG 1.7  --   --   --   --  2.2  --    < > = values in this interval not displayed.     Telemetry    SR with V pacing  (personally reviewed)  Radiology    EP PPM/ICD IMPLANT  Result Date: 02/13/2020 SURGEON:  Allegra Lai, MD   PREPROCEDURE DIAGNOSIS:  Complete AV block   POSTPROCEDURE DIAGNOSIS:  Complete AV block    PROCEDURES:  1. Pacemaker implantation.   INTRODUCTION:  Sarah Long is a 49 y.o. female with a history of bradycardia who presents today for pacemaker implantation.  The patient reports intermittent episodes of dizziness over the past few months.  No reversible causes have been identified.  The patient therefore presents today for pacemaker implantation.   DESCRIPTION OF PROCEDURE:  Informed written consent was obtained, and  the patient was brought to the electrophysiology lab in a fasting state.  The patient  required no sedation for the procedure today.  The patients left chest was prepped and draped in the usual sterile fashion by the EP lab staff. The skin overlying the left deltopectoral region was infiltrated with lidocaine for local analgesia.  A 4-cm incision was made over the left deltopectoral region.  A left subcutaneous pacemaker pocket was fashioned using a combination of sharp and blunt dissection. Electrocautery was required to assure hemostasis.  RA/RV Lead Placement: The left axillary vein was therefore cannulated.  Through the left axillary vein, a [employer] model Tendril MRI [id number] (serial number  [id number]) right atrial lead and a [employer] model Tendril MRI [id number] (serial number  [id number]) right ventricular lead were advanced with fluoroscopic visualization into the right atrial appendage and right ventricular apex positions respectively.  Initial atrial lead P- waves measured 1.8 mV with impedance of 484 ohms and a threshold of 1.4 V at 0.5 msec.  Right ventricular lead R-waves measured 12 mV paced with an impedance of 781 ohms and a threshold of 0.9 V at 0.5 msec.  Both leads were secured to the pectoralis fascia using #2-0 silk over the suture sleeves. Device Placement:  The leads were then connected to a [employer] MRI  model [id number] (serial number  [id number] ) pacemaker.  The pocket was irrigated with copious gentamicin solution.  The pacemaker was then placed into the pocket.  The pocket was then closed in 3 layers with 2.0 Vicryl suture for the 3.0 Vicryl suture subcutaneous and subcuticular layers.  Steri-  Strips and a sterile dressing were then applied. EBL<43ml.  There were no early apparent complications.   CONCLUSIONS:  1. Successful implantation of a [employer] Medical Assurity MRI dual-chamber pacemaker for symptomatic bradycardia  2. No early apparent complications.       Joslyne Marshburn Curt Bears, MD 02/13/2020 4:58 PM     Patient Profile     [name] is a 49 y.o.  female with a past medical istory of poorly controlled DM (unable to afford meds), HTN, HLD, and ongoing tobacco abuse who is being seen today for the evaluation of CHB at the request of Dr. Marlou Porch.  Assessment & Plan  1.  Complete heart block Doing well s/p PPM implant CXR with leads in stable position Device interrogation normal Routine wound care and follow up  2.  ARF Improving  3.  Shock Improving  4.  Acute hypoxic respiratory failure Extubated and stable  [facility] Elhadji Pecore sign off.   Medication Recommendations:  none Other recommendations (labs, testing, etc):  none Follow up as an outpatient:  As scheduled and entered in AVS   For questions or updates, please contact [place] Please consult [email or web address] for contact info under Cardiology/STEMI.  Signed, Chanetta Marshall, NP  02/14/2020, 8:02 AM   I have seen and examined this patient with Chanetta Marshall.  Agree with above, note added to reflect my findings.  On exam, RRR, no murmurs, lungs clear. S/p St. Jude pacemaker for complete AV block. Pacemaker functioning appropriately. Falon Flinchum arrange follow up in clinic.    Makenzye Troutman M. Kamaria Lucia MD 02/14/2020 9:16 AM

## 2020-02-14 NOTE — Care Management (Signed)
1253 02-14-20 Case Manager received consult for primary care provider and assistance for medications. Appointment has been scheduled at the [facility] for hospital follow up and primary care. Patient will be able to use the clinic pharmacy for medications that will range in cost from $4.00-$10.00. Patient will have to pay a sliding scale fee for patient to be seen at the clinic. Please send all new prescriptions to the Transitions of Care pharmacy to be filled before she leaves the hospital. Case Manager can see if she is eligible for the [facility] program. Case Manager will continue to follow for additional transition of care needs. Bethena Roys, RN,BSN Case Manager

## 2020-02-14 NOTE — Evaluation (Signed)
Physical Therapy Evaluation Patient Details Name: Sarah Long MRN: [id number] DOB: June 22, 1971 Today's Date: 02/14/2020   History of Present Illness  49 y/o female w/ hx of obesity, HTN, HSV-2 infection, Herpes, dizzyness, DM, depression, bipolar 1 disorder, temp pacemaker (02/10/20), pacemake implant (02/13/20). Pt had presented to hospital with c/o 10/10 chest pain and elevated troponin, pt was dx with complete AV block, s/p new pacemaker (3/10)  Clinical Impression   Pt admitted with above diagnosis. 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 Pt currently with functional limitations due to the deficits listed below (see PT Problem List). This am was needing min-mod a with mobility, to get to edge of bed, transfer to [facility], had some difficulty with peri care after using BSC (needed mod a to successfully complete). Pt was able to ambulate approx 75ft with RW and min guard assist, c/o exhaustion at end of this. Pt presenting with decreased strength, balance and coordination, activity tolerance, independence and overall safety with mobility. Pt will benefit from skilled PT to increase her independence and safety with mobility to allow discharge to the venue listed below.       Follow Up Recommendations Supervision for mobility/OOB;Home health PT    Equipment Recommendations  Other (comment)(TBD)    Recommendations for Other Services OT consult     Precautions / Restrictions Precautions Precautions: Fall;ICD/Pacemaker Restrictions Weight Bearing Restrictions: Yes LUE Weight Bearing: Partial weight bearing      Mobility  Bed Mobility Overal bed mobility: Needs Assistance Bed Mobility: Supine to Sit;Sit to Supine     Supine to sit: Min assist Sit to supine: Min assist   General bed mobility comments: needs some cues and set up to complete  Transfers Overall transfer level: Needs assistance Equipment used:  Rolling walker (2 wheeled) Transfers: Sit to/from [facility] Sit to Stand: Min assist;Mod assist Stand pivot transfers: Min assist;Mod assist       General transfer comment: needed cues and reinforcement of LUE precautions  Ambulation/Gait Ambulation/Gait assistance: Min guard;Supervision Gait Distance (Feet): 20 Feet Assistive device: Rolling walker (2 wheeled) Gait Pattern/deviations: Step-through pattern;Wide base of support;Staggering left;Staggering right Gait velocity: dec   General Gait Details: short some shuffled steps, dicscontinuous w/ elevate walker  Stairs            Wheelchair Mobility    Modified Rankin (Stroke Patients Only)       Balance Overall balance assessment: Needs assistance Sitting-balance support: Feet supported Sitting balance-Leahy Scale: Good     Standing balance support: During functional activity;Bilateral upper extremity supported Standing balance-Leahy Scale: Poor Standing balance comment: needs some UE support for safey                             Pertinent Vitals/Pain Pain Assessment: 0-10 Pain Score: 10-Worst pain ever Pain Location: abdomen/stomach Pain Descriptors / Indicators: Cramping(ruminating pain-moves around) Pain Intervention(s): Limited activity within patient's tolerance;Monitored during session    Home Living Family/patient expects to be discharged to:: Other (Comment)(hotel) Living Arrangements: Spouse/significant other;Children(3 kids)   Type of Home: Other(Comment)(hotel) Home Access: Level entry     Home Layout: One level Home Equipment: None      Prior Function Level of Independence: Independent               Hand Dominance   Dominant Hand: Right    Extremity/Trunk Assessment   Upper  Extremity Assessment Upper Extremity Assessment: Generalized weakness    Lower Extremity Assessment Lower Extremity Assessment: Generalized weakness    Cervical / Trunk  Assessment Cervical / Trunk Assessment: Normal  Communication   Communication: No difficulties  Cognition Arousal/Alertness: Lethargic Behavior During Therapy: Anxious Overall Cognitive Status: No family/caregiver present to determine baseline cognitive functioning                                 General Comments: seems to be at or close to baseline      General Comments General comments (skin integrity, edema, etc.): on RA and VSS stable    Exercises     Assessment/Plan    PT Assessment Patient needs continued PT services  PT Problem List Decreased strength;Decreased activity tolerance;Decreased balance;Decreased mobility;Decreased coordination;Decreased knowledge of use of DME;Decreased safety awareness       PT Treatment Interventions DME instruction;Gait training;Functional mobility training;Therapeutic activities;Therapeutic exercise;Balance training;Neuromuscular re-education;Patient/family education    PT Goals (Current goals can be found in the Care Plan section)  Acute Rehab PT Goals Patient Stated Goal: to reduce pain PT Goal Formulation: With patient Time For Goal Achievement: 02/28/20 Potential to Achieve Goals: Good    Frequency Min 3X/week   Barriers to discharge Other (comment) lives in hotel with familt    Co-evaluation               AM-PAC PT "6 Clicks" Mobility  Outcome Measure Help needed turning from your back to your side while in a flat bed without using bedrails?: A Little Help needed moving from lying on your back to sitting on the side of a flat bed without using bedrails?: A Little Help needed moving to and from a bed to a chair (including a wheelchair)?: A Little Help needed standing up from a chair using your arms (e.g., wheelchair or bedside chair)?: A Lot Help needed to walk in hospital room?: A Little Help needed climbing 3-5 steps with a railing? : A Lot 6 Click Score: 16    End of Session   Activity Tolerance:  Patient limited by fatigue;Patient limited by lethargy;Patient limited by pain Patient left: in bed;with call bell/phone within reach;with bed alarm set;with nursing/sitter in room Nurse Communication: Mobility status PT Visit Diagnosis: Unsteadiness on feet (R26.81);Other abnormalities of gait and mobility (R26.89);Muscle weakness (generalized) (M62.81)    Time: [id number] PT Time Calculation (min) (ACUTE ONLY): 24 min   Charges:   PT Evaluation $PT Eval Moderate Complexity: 1 Mod PT Treatments $Therapeutic Activity: 8-22 mins        Horald Chestnut, PT   Delford Field 02/14/2020, 10:37 AM

## 2020-02-14 NOTE — Evaluation (Signed)
Occupational Therapy Evaluation Patient Details Name: Sarah Long MRN: [id number] DOB: 1971/11/09 Today's Date: 02/14/2020    History of Present Illness 49 y/o female w/ hx of obesity, HTN, HSV-2 infection, Herpes, dizzyness, DM, depression, bipolar 1 disorder, temp pacemaker (02/10/20), pacemake implant (02/13/20). Pt had presented to hospital with c/o 10/10 chest pain and elevated troponin, pt was dx with complete AV block, s/p new pacemaker (3/10)   Clinical Impression   Pt admitted with the above diagnoses and presents with below problem list. Pt will benefit from continued acute OT to address the below listed deficits and maximize independence with basic ADLs prior to d/c home. PTA pt was independent with ADLs. Pt is currently min to mod A with UB/LB ADLs and toileting (stand pivot to [facility]). Pt limited by 7/10 abdominal pain and fatigue. Optimistic that pt will progress with therapy goals to a point she can d/c directly home with family. Will continue to follow acutely and update recommendations as appropriate.      Follow Up Recommendations  Supervision - Intermittent;Home health OT(OOB/mobility)    Equipment Recommendations  3 in 1 bedside commode    Recommendations for Other Services       Precautions / Restrictions Precautions Precautions: Fall;ICD/Pacemaker Restrictions Weight Bearing Restrictions: Yes LUE Weight Bearing: Partial weight bearing      Mobility Bed Mobility Overal bed mobility: Needs Assistance Bed Mobility: Sit to Supine       Sit to supine: Min assist   General bed mobility comments: fatigued at end of session. min A to steady. min A to scoot up in bed  Transfers Overall transfer level: Needs assistance Equipment used: Rolling walker (2 wheeled) Transfers: Sit to/from [facility] Sit to Stand: Min assist;Mod assist Stand pivot transfers: Min assist;Mod assist       General transfer comment: to/from BSC and EOB. steadying  assist. cues for hand placement with LUE precautions.     Balance Overall balance assessment: Needs assistance Sitting-balance support: Feet supported Sitting balance-Leahy Scale: Fair Sitting balance - Comments: + dizzy   Standing balance support: During functional activity;Bilateral upper extremity supported Standing balance-Leahy Scale: Poor Standing balance comment: external support for static standing                           ADL either performed or assessed with clinical judgement   ADL Overall ADL's : Needs assistance/impaired Eating/Feeding: Set up;Sitting   Grooming: Set up;Sitting;Min guard   Upper Body Bathing: Minimal assistance;Sitting   Lower Body Bathing: Moderate assistance;Sit to/from stand   Upper Body Dressing : Minimal assistance;Sitting   Lower Body Dressing: Moderate assistance;Sit to/from stand   Toilet Transfer: Minimal assistance;Stand-pivot;Moderate assistance;BSC;RW   Toileting- Clothing Manipulation and Hygiene: Moderate assistance;Sit to/from stand Toileting - Clothing Manipulation Details (indicate cue type and reason): assist to steady and some assist for pericare tasks in standing       General ADL Comments: Pt up to [facility] for toileting and pericare tasks. c/o dizziness, suspect med related. back to bed at end of session.      Vision         Perception     Praxis      Pertinent Vitals/Pain Pain Assessment: 0-10 Pain Score: 7  Pain Location: abdomen/stomach Pain Descriptors / Indicators: Cramping;Grimacing Pain Intervention(s): Limited activity within patient's tolerance;Monitored during session     Hand Dominance Right   Extremity/Trunk Assessment Upper Extremity Assessment Upper Extremity Assessment: Generalized weakness;LUE deficits/detail  LUE Deficits / Details: s/p pacemaker placement, current restrictions to ROM to L shoulder    Lower Extremity Assessment Lower Extremity Assessment: Defer to PT evaluation    Cervical / Trunk Assessment Cervical / Trunk Assessment: Normal   Communication Communication Communication: No difficulties   Cognition Arousal/Alertness: Awake/alert Behavior During Therapy: WFL for tasks assessed/performed;Anxious Overall Cognitive Status: Within Functional Limits for tasks assessed                                     General Comments       Exercises     Shoulder Instructions      Home Living Family/patient expects to be discharged to:: Other (Comment)( hotel) Living Arrangements: Spouse/significant other;Children(3 kids)   Type of Home: Other(Comment)(hotel) Home Access: Level entry     Home Layout: One level     Bathroom Shower/Tub: Teacher, early years/pre: Standard Bathroom Accessibility: No   Home Equipment: None          Prior Functioning/Environment Level of Independence: Independent        Comments: 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        OT Problem List: Decreased strength;Decreased activity tolerance;Impaired balance (sitting and/or standing);Decreased knowledge of use of DME or AE;Decreased knowledge of precautions;Pain      OT Treatment/Interventions: Self-care/ADL training;Therapeutic exercise;Energy conservation;DME and/or AE instruction;Therapeutic activities;Patient/family education;Balance training    OT Goals(Current goals can be found in the care plan section) Acute Rehab OT Goals Patient Stated Goal: to reduce pain OT Goal Formulation: With patient Time For Goal Achievement: 02/28/20 Potential to Achieve Goals: Good ADL Goals Pt Will Perform Lower Body Bathing: with modified independence;sit to/from stand Pt Will Perform Lower Body Dressing: with modified independence;sit to/from stand Pt Will Transfer to Toilet: with modified independence;ambulating Pt Will Perform Toileting - Clothing Manipulation and hygiene: with modified independence;sit to/from stand Pt Will Perform  Tub/Shower Transfer: shower seat;with supervision;rolling walker Additional ADL Goal #1: Pt will complete bed mobility at mod I level to prepare for EOB/OOB ADLs.  OT Frequency: Min 2X/week   Barriers to D/C:            Co-evaluation              AM-PAC OT "6 Clicks" Daily Activity     Outcome Measure Help from another person eating meals?: None Help from another person taking care of personal grooming?: A Little Help from another person toileting, which includes using toliet, bedpan, or urinal?: A Lot Help from another person bathing (including washing, rinsing, drying)?: A Lot Help from another person to put on and taking off regular upper body clothing?: A Little Help from another person to put on and taking off regular lower body clothing?: A Lot 6 Click Score: 16   End of Session Equipment Utilized During Treatment: Rolling walker  Activity Tolerance: Patient limited by fatigue;Patient limited by pain Patient left: in bed;with call bell/phone within reach;with bed alarm set  OT Visit Diagnosis: Unsteadiness on feet (R26.81);Muscle weakness (generalized) (M62.81);Pain                Time: [id number] OT Time Calculation (min): 22 min Charges:  OT General Charges $OT Visit: 1 Visit OT Evaluation $OT Eval Low Complexity: [place], OT Acute Rehabilitation Services Pager: [phone] Office: [phone]   Hortencia Pilar 02/14/2020, 1:05 PM

## 2020-02-14 NOTE — Progress Notes (Signed)
PROGRESS NOTE  Sarah Long [id number] DOB: 1971/10/01   PCP: [name], MD  Patient is from: home  DOA: 02/09/2020 LOS: 4  Brief Narrative / Interim history: 49 yr old obese F w/ significant PMHx HTN, HLD and IDDM presented to [facility] with 10/10 chest pain. Per the triage note pt reported vomiting all day, being short of breath and having severe chest pain feeling like she was going to pass out. She denied loss of consciousness or syncopal episode.   Her initial Vitals were 133/1mmHg HR 41 RR 26 O2 sat 94% .  CBG>600mg /dl.  Bicarb obtained.  AG 30. Na 131.  K5.3. Cr 2.85.  Lipase 1879 AST 582.  ALT 294.  EKG showed an  Idioventricular rhythm with LAD, AVB, LVH and an inferior infarct (QRS 162 ms and QT/QTc 574/467 ms).  Troponin 730>> 2000.   Patient went into complete AVB with HR of 39 while in ED.  Started on external pacer.  Done she becomes somnolent after IV Versed requiring intubation.  Cardiology consulted and she was admitted to ICU.  She was also started on Unasyn for possible aspiration pneumonia.  Patient underwent LHC on 3/7 which was negative for CAD.  She had temporary venous pacemaker advanced at the same time.  Echo with EF of 40 to 45%, global hypokinesis, severe LVH and indeterminate diastolic function but no other significant finding.  She eventually had permanent PPM/ICD implant on 02/13/2020.  Patient was extubated on 02/11/2020, and has been vomiting.  Patient also with acute uncomplicated pancreatitis has noted on CT abdomen and pelvis on 02/10/2020.  See individual problem list below for more hospital course.  Subjective: No major events overnight or this morning.  Feels tired.  Complains of abdominal pain across lower abdomen.  She says she had a normal bowel movement this morning.  Denies chest pain, dyspnea or palpitation.  Denies UTI symptoms.  Objective: Vitals:   02/13/20 1930 02/14/20 0527 02/14/20 0810 02/14/20 1149  BP: (!) 157/75 130/76 125/85 (!)  142/91  Pulse:  91 92 93  Resp:  18 18 18   Temp:  (!) 97.4 F (36.3 C) 97.6 F (36.4 C) 97.8 F (36.6 C)  TempSrc:  Oral Oral Oral  SpO2:  98%  99%  Weight:      Height:        Intake/Output Summary (Last 24 hours) at 02/14/2020 1252 Last data filed at 02/14/2020 0940 Gross per 24 hour  Intake 1885.01 ml  Output 500 ml  Net 1385.01 ml   Filed Weights   02/11/20 0500 02/12/20 0500 02/13/20 0500  Weight: 100.2 kg 106.5 kg 102 kg    Examination:  GENERAL: No acute distress.  Appears well.  HEENT: MMM.  Vision and hearing grossly intact.  NECK: Supple.  No apparent JVD.  RESP: On RA.  No IWOB.  Fair aeration bilaterally. CVS:  RRR. Heart sounds normal.  ABD/GI/GU: Bowel sounds present. Soft.  Diffuse tenderness more across lower abdomen. MSK/EXT:  Moves extremities. No apparent deformity. No edema.  SKIN: no apparent skin lesion or wound NEURO: Awake, alert and oriented appropriately.  No apparent focal neuro deficit. PSYCH: Calm. Normal affect.  Procedures:  ETT 3/7-3/8 LHC/temporary internal venous pacer on 3/7 PPM/ICD placement on 3/10 2D echo on 3/7-LVEF 40 to 45%, global hypokinesis.  Severe LVH  Assessment & Plan: Complete Heart Block/bifascicular block (RBBB and LAFB)  -s/p PPM and ICD on 02/13/2020.  Device interrogation normal. -Cardiology following.  Chest pain/elevated troponin-troponin  elevated to 730> 2000. EKG showed an  Idioventricular rhythm with LAD, AVB, LVH and an inferior infarct (QRS 162 ms and QT/QTc 574/467 ms). LHC on 3/5 without CAD.  Now chest pain-free.  No dyspnea.  -LHC on 3/7 without CAD.  -Optimize risk reduction  New systolic CHF: Echo on 3/7 with LVEF of 40 to 45%, global hypokinesis, severe LVH.  Repeat CXR with improved aeration with decreasing vascular congestion and bibasilar atelectasis.  On room air.  No cardiopulmonary symptoms at this time.  -PPM/ICD as above -Monitor fluid status, renal function and electrolytes.  Acute  hypoxic respiratory failure: Was intubated due to somnolence after IV Versed.  Also need CHF and possible aspiration pneumonia.  Resolved.  Now on room air. -Switch to p.o. Augmentin to complete antibiotic course for possible aspiration pneumonia -Incentive spirometry  Uncontrolled DM-2 with DKA and hyperglycemia: DKA resolved.  A1c 12.7% Recent Labs    02/14/20 0636 02/14/20 0755 02/14/20 1126  GLUCAP 104* 115* 98  -Change SSI moderate ACHS -IV Levemir 25 units twice daily -Discontinue D5-NS -Appreciate insight by diabetic coordinator -Statin-we will discontinue if LFT high.  Acute pancreatitis: Due to alcohol?  LFT pattern concerning for this or rhabdo.  CT abdomen and pelvis on 3/7 with acute uncomplicated pancreatitis, fatty liver and gallbladder sludge.  Lipase 1900> 941.  AST/ALT 582/294.  ALP and total bili within normal arguing against obstructive etiology.  Triglyceride normal.  Calcium is very low.  -RUQ ultrasound and CK -Trend lipase and LFT -Pain control.  Repeat CT if pain worse. -Adjust diet as tolerated -Replenish calcium  Acute kidney injury: Cr 2.85 (admit) >>> 1.21.  Likely a combination of prerenal etiology in the setting of cardiac issues and iatrogenic from ARB/HCTZ and NSAIDs.  Improving. -Continue monitoring -Avoid nephrotoxic meds -Continue monitoring  Hypocalcemia: Calcium 4.9.  Ionized calcium was 0.71 -Calcium gluconate  Hyponatremia in the setting of AKI: Resolved. -Continue monitoring  Hypokalemia -Replenish and recheck  Lactic acidosis: LA > 11 on admit>>> 2.1.  Normocytic anemia: Hgb 10.5 (admit)>> 10.2 -Check anemia panel  Morbid obesity: Body mass index is 38.6 kg/m. -Encourage lifestyle change to lose weight -Consult dietitian               DVT prophylaxis: Subcu heparin Code Status: Full code Family Communication: Patient and/or RN. Available if any question.   Discharge barrier: Acute pancreatitis, AKI and significant  hypokalemia Patient is from: Home Final disposition: Likely home when medically stable  Consultants: PCCM (off), cardiology, EP,   Microbiology summarized: Influenza PCR negative COVID-19 negative MRSA PCR negative Blood cultures negative  Sch Meds:  Scheduled Meds: . [START ON 02/15/2020] aspirin  81 mg Oral Daily  . atorvastatin  80 mg Oral q1800  . Chlorhexidine Gluconate Cloth  6 each Topical Daily  . folic acid  1 mg Oral Daily  . heparin  5,000 Units Subcutaneous Q8H  . insulin aspart  0-15 Units Subcutaneous TID WC  . insulin aspart  0-5 Units Subcutaneous QHS  . insulin detemir  25 Units Subcutaneous BID  . pantoprazole  40 mg Oral Daily  . potassium chloride  40 mEq Oral Q4H  . sodium chloride flush  10-40 mL Intracatheter Q12H  . sodium chloride flush  3 mL Intravenous Q12H   Continuous Infusions: . sodium chloride Stopped (02/13/20 0820)  . sodium chloride    . sodium chloride 250 mL (02/14/20 0043)  . ampicillin-sulbactam (UNASYN) IV 3 g (02/14/20 1110)   PRN Meds:.sodium chloride,  Place/Maintain arterial line **AND** sodium chloride, sodium chloride, acetaminophen, dextrose, fentaNYL (SUBLIMAZE) injection, Melatonin, ondansetron (ZOFRAN) IV, sodium chloride flush, sodium chloride flush  Antimicrobials: Anti-infectives (From admission, onward)   Start     Dose/Rate Route Frequency Ordered Stop   02/13/20 2000  ceFAZolin (ANCEF) IVPB 1 g/50 mL premix     1 g 100 mL/hr over 30 Minutes Intravenous Every 6 hours 02/13/20 1740 02/14/20 0927   02/13/20 1702  gentamicin (GARAMYCIN) 80 mg in sodium chloride 0.9 % 500 mL irrigation  Status:  Discontinued       As needed 02/13/20 1703 02/13/20 1719   02/13/20 0045  gentamicin (GARAMYCIN) 80 mg in sodium chloride 0.9 % 500 mL irrigation  Status:  Discontinued     80 mg Irrigation On call 02/13/20 0042 02/13/20 1734   02/13/20 0045  ceFAZolin (ANCEF) IVPB 2g/100 mL premix     2 g 200 mL/hr over 30 Minutes Intravenous On  call 02/13/20 0042 02/13/20 1615   02/11/20 1130  Ampicillin-Sulbactam (UNASYN) 3 g in sodium chloride 0.9 % 100 mL IVPB     3 g 200 mL/hr over 30 Minutes Intravenous Every 6 hours 02/11/20 1115 02/18/20 0529       I have personally reviewed the following labs and images: CBC: Recent Labs  Lab 02/10/20 0002 02/10/20 0039 02/10/20 0344 02/10/20 0344 02/10/20 0553 02/10/20 1213 02/11/20 0436 02/13/20 0437 02/13/20 0551  WBC 13.0*  --  14.2*  --   --   --  13.4* 10.3  --   NEUTROABS  --   --   --   --   --   --  10.0*  --   --   HGB 11.8*   < > 11.7*   < > 10.5* 10.5* 11.4* 9.4* 10.2*  HCT 41.0   < > 38.2   < > 31.0* 31.0* 33.9* 29.4* 30.0*  MCV 103.8*  --  97.7  --   --   --  89.7 93.3  --   PLT 255  --  207  --   --   --  204 159  --    < > = values in this interval not displayed.   BMP &GFR Recent Labs  Lab 02/10/20 1805 02/10/20 2153 02/11/20 0436 02/11/20 0816 02/12/20 0027 02/12/20 0027 02/12/20 0529 02/12/20 [id number] 02/13/20 0437 02/13/20 0551 02/13/20 0559 02/14/20 0416  NA   < >  --  133*   < > 133*   < > 134* 137 140 140  --  139  K   < >  --  4.0   < > 3.6   < > 3.4* 3.5 3.7 3.6  --  3.4*  CL   < >  --  103   < > 105   < > 105 108 108 108  --  109  CO2   < >  --  17*   < > 17*  --  18* 20* 20*  --   --  20*  GLUCOSE   < >  --  144*   < > 178*   < > 152* 148* 208* 187*  --  81  BUN   < >  --  27*   < > 28*   < > 26* 23* 19 17  --  12  CREATININE   < >  --  1.91*   < > 1.59*   < > 1.62* 1.40* 1.27* 1.10*  --  1.21*  CALCIUM   < >  --  6.6*   < > 6.2*  --  6.2* 5.9* 5.3*  --   --  4.9*  MG  --  1.2* 1.8  --   --   --   --  1.7  --   --  2.2  --   PHOS  --  2.7 3.1  --   --   --   --   --   --   --   --   --    < > = values in this interval not displayed.   Estimated Creatinine Clearance: 67.5 mL/min (A) (by C-G formula based on SCr of 1.21 mg/dL (H)). Liver & Pancreas: Recent Labs  Lab 02/10/20 0700 02/10/20 2019 02/11/20 0436  AST 736* 582*  --   ALT  275* 294*  --   ALKPHOS 68 58  --   BILITOT 1.0 0.7  --   PROT 5.5* 5.5*  --   ALBUMIN 2.6* 2.5* 2.3*   Recent Labs  Lab 02/10/20 2153  LIPASE 1,879*   No results for input(s): AMMONIA in the last 168 hours. Diabetic: No results for input(s): HGBA1C in the last 72 hours. Recent Labs  Lab 02/14/20 0147 02/14/20 0519 02/14/20 0636 02/14/20 0755 02/14/20 1126  GLUCAP 90 61* 104* 115* 98   Cardiac Enzymes: No results for input(s): CKTOTAL, CKMB, CKMBINDEX, TROPONINI in the last 168 hours. No results for input(s): PROBNP in the last 8760 hours. Coagulation Profile: No results for input(s): INR, PROTIME in the last 168 hours. Thyroid Function Tests: No results for input(s): TSH, T4TOTAL, FREET4, T3FREE, THYROIDAB in the last 72 hours. Lipid Profile: No results for input(s): CHOL, HDL, LDLCALC, TRIG, CHOLHDL, LDLDIRECT in the last 72 hours. Anemia Panel: No results for input(s): VITAMINB12, FOLATE, FERRITIN, TIBC, IRON, RETICCTPCT in the last 72 hours. Urine analysis:    Component Value Date/Time   COLORURINE YELLOW 02/10/2020 0617   APPEARANCEUR CLOUDY (A) 02/10/2020 0617   LABSPEC 1.015 02/10/2020 0617   PHURINE 5.0 02/10/2020 0617   GLUCOSEU >=500 (A) 02/10/2020 0617   HGBUR MODERATE (A) 02/10/2020 0617   BILIRUBINUR NEGATIVE 02/10/2020 0617   BILIRUBINUR negative 07/10/2019 1445   BILIRUBINUR neg 09/28/2017 0907   KETONESUR NEGATIVE 02/10/2020 0617   PROTEINUR 100 (A) 02/10/2020 0617   UROBILINOGEN 0.2 07/10/2019 1445   UROBILINOGEN 0.2 07/16/2015 2024   NITRITE NEGATIVE 02/10/2020 0617   LEUKOCYTESUR NEGATIVE 02/10/2020 0617   Sepsis Labs: Invalid input(s): PROCALCITONIN, [place]  Microbiology: Recent Results (from the past 240 hour(s))  Respiratory Panel by RT PCR (Flu A&B, Covid) - Nasopharyngeal Swab     Status: None   Collection Time: 02/10/20 12:30 AM   Specimen: Nasopharyngeal Swab  Result Value Ref Range Status   SARS Coronavirus 2 by RT PCR  NEGATIVE NEGATIVE Final    Comment: (NOTE) SARS-CoV-2 target nucleic acids are NOT DETECTED. The SARS-CoV-2 RNA is generally detectable in upper respiratoy specimens during the acute phase of infection. The lowest concentration of SARS-CoV-2 viral copies this assay can detect is 131 copies/mL. A negative result does not preclude SARS-Cov-2 infection and should not be used as the sole basis for treatment or other patient management decisions. A negative result may occur with  improper specimen collection/handling, submission of specimen other than nasopharyngeal swab, presence of viral mutation(s) within the areas targeted by this assay, and inadequate number of viral copies (<131 copies/mL). A negative result must be combined with clinical observations, patient history,  and epidemiological information. The expected result is Negative. Fact Sheet for Patients:  PinkCheek.be Fact Sheet for Healthcare Providers:  GravelBags.it This test is not yet ap proved or cleared by the [place] FDA and  has been authorized for detection and/or diagnosis of SARS-CoV-2 by FDA under an Emergency Use Authorization (EUA). This EUA will remain  in effect (meaning this test can be used) for the duration of the COVID-19 declaration under Section 564(b)(1) of the Act, 21 U.S.C. section 360bbb-3(b)(1), unless the authorization is terminated or revoked sooner.    Influenza A by PCR NEGATIVE NEGATIVE Final   Influenza B by PCR NEGATIVE NEGATIVE Final    Comment: (NOTE) The Xpert Xpress SARS-CoV-2/FLU/RSV assay is intended as an aid in  the diagnosis of influenza from Nasopharyngeal swab specimens and  should not be used as a sole basis for treatment. Nasal washings and  aspirates are unacceptable for Xpert Xpress SARS-CoV-2/FLU/RSV  testing. Fact Sheet for Patients: PinkCheek.be Fact Sheet for Healthcare  Providers: GravelBags.it This test is not yet approved or cleared by the [place] FDA and  has been authorized for detection and/or diagnosis of SARS-CoV-2 by  FDA under an Emergency Use Authorization (EUA). This EUA will remain  in effect (meaning this test can be used) for the duration of the  Covid-19 declaration under Section 564(b)(1) of the Act, 21  U.S.C. section 360bbb-3(b)(1), unless the authorization is  terminated or revoked. Performed at [facility], [place] [address], [place], [place] [address]   MRSA PCR Screening     Status: None   Collection Time: 02/10/20  3:40 AM   Specimen: Nasal Mucosa; Nasopharyngeal  Result Value Ref Range Status   MRSA by PCR NEGATIVE NEGATIVE Final    Comment:        The GeneXpert MRSA Assay (FDA approved for NASAL specimens only), is one component of a comprehensive MRSA colonization surveillance program. It is not intended to diagnose MRSA infection nor to guide or monitor treatment for MRSA infections. Performed at [facility], [place] [address], [place], [place] [address]   Culture, blood (routine x 2)     Status: None (Preliminary result)   Collection Time: 02/10/20  9:26 PM   Specimen: BLOOD RIGHT HAND  Result Value Ref Range Status   Specimen Description BLOOD RIGHT HAND  Final   Special Requests   Final    BOTTLES DRAWN AEROBIC ONLY Blood Culture results may not be optimal due to an inadequate volume of blood received in culture bottles   Culture   Final    NO GROWTH 4 DAYS Performed at [facility], [place] [address] [place], [place] [address]    Report Status PENDING  Incomplete  Culture, blood (routine x 2)     Status: None (Preliminary result)   Collection Time: 02/10/20  9:27 PM   Specimen: BLOOD  Result Value Ref Range Status   Specimen Description BLOOD RIGHT ANTECUBITAL  Final   Special Requests   Final    BOTTLES DRAWN AEROBIC ONLY Blood Culture adequate volume    Culture   Final    NO GROWTH 4 DAYS Performed at [facility], [place] [address] [place], [place] [address]    Report Status PENDING  Incomplete  Surgical PCR screen     Status: None   Collection Time: 02/13/20  1:11 AM   Specimen: Nasal Mucosa; Nasal Swab  Result Value Ref Range Status   MRSA, PCR NEGATIVE NEGATIVE  Final   Staphylococcus aureus NEGATIVE NEGATIVE Final    Comment: (NOTE) The Xpert SA Assay (FDA approved for NASAL specimens in patients 65 years of age and older), is one component of a comprehensive surveillance program. It is not intended to diagnose infection nor to guide or monitor treatment. Performed at [facility], [place] [address], [place], [place] [address]     Radiology Studies: DG Chest 2 View  Result Date: 02/14/2020 CLINICAL DATA:  Permanent pacemaker insertion. EXAM: CHEST - 2 VIEW COMPARISON:  Chest x-ray 02/10/2020 FINDINGS: Removal of endotracheal tube and left IJ central venous catheter. Permanent left-sided pacemaker with right atrial and right ventricular wires in good position and no complicating features. Stable cardiac enlargement. Low lung volumes with vascular crowding and streaky bibasilar atelectasis. Mild persistent vascular congestion but overall much improved aeration. IMPRESSION: 1. Improving lung aeration with decreasing vascular congestion and bibasilar atelectasis. 2. Removal of endotracheal tube and left IJ central venous catheter. 3. Pacer wires in good position without complicating features. Electronically Signed   By: Marijo Sanes M.D.   On: 02/14/2020 09:38   EP PPM/ICD IMPLANT  Result Date: 02/13/2020 SURGEON:  Allegra Lai, MD   PREPROCEDURE DIAGNOSIS:  Complete AV block   POSTPROCEDURE DIAGNOSIS:  Complete AV block    PROCEDURES:  1. Pacemaker implantation.   INTRODUCTION:  Sarah Long is a 49 y.o. female with a history of bradycardia who presents today for pacemaker implantation.  The patient reports intermittent episodes  of dizziness over the past few months.  No reversible causes have been identified.  The patient therefore presents today for pacemaker implantation.   DESCRIPTION OF PROCEDURE:  Informed written consent was obtained, and  the patient was brought to the electrophysiology lab in a fasting state.  The patient required no sedation for the procedure today.  The patients left chest was prepped and draped in the usual sterile fashion by the EP lab staff. The skin overlying the left deltopectoral region was infiltrated with lidocaine for local analgesia.  A 4-cm incision was made over the left deltopectoral region.  A left subcutaneous pacemaker pocket was fashioned using a combination of sharp and blunt dissection. Electrocautery was required to assure hemostasis.  RA/RV Lead Placement: The left axillary vein was therefore cannulated.  Through the left axillary vein, a [employer] model Tendril MRI [id number] (serial number  [id number]) right atrial lead and a [employer] model Tendril MRI [id number] (serial number  [id number]) right ventricular lead were advanced with fluoroscopic visualization into the right atrial appendage and right ventricular apex positions respectively.  Initial atrial lead P- waves measured 1.8 mV with impedance of 484 ohms and a threshold of 1.4 V at 0.5 msec.  Right ventricular lead R-waves measured 12 mV paced with an impedance of 781 ohms and a threshold of 0.9 V at 0.5 msec.  Both leads were secured to the pectoralis fascia using #2-0 silk over the suture sleeves. Device Placement:  The leads were then connected to a [employer] MRI  model [id number] (serial number  [id number] ) pacemaker.  The pocket was irrigated with copious gentamicin solution.  The pacemaker was then placed into the pocket.  The pocket was then closed in 3 layers with 2.0 Vicryl suture for the 3.0 Vicryl suture subcutaneous and subcuticular layers.  Steri-  Strips and a sterile dressing were then applied. EBL<41ml.   There were no early apparent complications.   CONCLUSIONS:  1. Successful implantation of  a [employer] MRI dual-chamber pacemaker for symptomatic bradycardia  2. No early apparent complications.       Will Curt Bears, MD 02/13/2020 4:58 PM   DG Abd Portable 1V  Result Date: 02/14/2020 CLINICAL DATA:  Abdominal pain. History of pancreatitis. EXAM: PORTABLE ABDOMEN - 1 VIEW COMPARISON:  CT scan 02/10/2020 FINDINGS: Air-filled loops of small bowel in the central abdomen could represent ileus associated with the patient's pancreatitis. Small-bowel obstruction not excluded. Recommend correlation with bowel sounds and follow-up radiographs as indicated. IMPRESSION: Air-filled loops of small bowel in the central abdomen could represent ileus associated with the patient's pancreatitis. Electronically Signed   By: Marijo Sanes M.D.   On: 02/14/2020 09:43   55 minutes with more than 50% spent in reviewing records, counseling patient/family and coordinating care.   Alaena Strader T. Fouke  If 7PM-7AM, please contact night-coverage [email or web address] Password [facility] 02/14/2020, 12:52 PM

## 2020-02-14 NOTE — Progress Notes (Signed)
Spoke to patient on the phone. States that she has had diabetes since age 49. Was on oral agents at the beginning, but has been on insulin now for about 10 years. Was going to [facility], but had not been able to go for about 3 months and could not get her insulin prescription refilled. Was having trouble getting transportation to the clinic and financial issues. States that she has a glucometer at home, but does not like to stick her fingers. HgbA1C is 12.7%. states that "it has been that for awhile." discussed the significance of the high A1C. States that she has never been in DKA before.  Will consult case management for follow up at [facility]. Patient states that she can get to the clinic after discharge. Will continue to monitor blood sugars while in the hospital.  Harvel Ricks RN BSN CDE Diabetes Coordinator Pager: [phone]  8am-5pm

## 2020-02-15 ENCOUNTER — Inpatient Hospital Stay (HOSPITAL_COMMUNITY): Payer: Medicaid Other

## 2020-02-15 DIAGNOSIS — F17200 Nicotine dependence, unspecified, uncomplicated: Secondary | ICD-10-CM

## 2020-02-15 DIAGNOSIS — F1020 Alcohol dependence, uncomplicated: Secondary | ICD-10-CM

## 2020-02-15 DIAGNOSIS — K852 Alcohol induced acute pancreatitis without necrosis or infection: Secondary | ICD-10-CM

## 2020-02-15 DIAGNOSIS — M6282 Rhabdomyolysis: Secondary | ICD-10-CM

## 2020-02-15 LAB — RETICULOCYTES
Immature Retic Fract: 5 % (ref 2.3–15.9)
RBC.: 3.11 MIL/uL — ABNORMAL LOW (ref 3.87–5.11)
Retic Count, Absolute: 46.3 10*3/uL (ref 19.0–186.0)
Retic Ct Pct: 1.5 % (ref 0.4–3.1)

## 2020-02-15 LAB — VITAMIN D 25 HYDROXY (VIT D DEFICIENCY, FRACTURES): Vit D, 25-Hydroxy: <4.2 ng/mL — ABNORMAL LOW (ref 30–100)

## 2020-02-15 LAB — CK TOTAL AND CKMB (NOT AT ARMC)
CK, MB: 4.9 ng/mL (ref 0.5–5.0)
Relative Index: 0.5 (ref 0.0–2.5)
Total CK: 931 U/L — ABNORMAL HIGH (ref 38–234)

## 2020-02-15 LAB — RENAL FUNCTION PANEL
Albumin: 1.9 g/dL — ABNORMAL LOW (ref 3.5–5.0)
Anion gap: 10 (ref 5–15)
BUN: 8 mg/dL (ref 6–20)
CO2: 18 mmol/L — ABNORMAL LOW (ref 22–32)
Calcium: 4.4 mg/dL — CL (ref 8.9–10.3)
Chloride: 107 mmol/L (ref 98–111)
Creatinine, Ser: 1.25 mg/dL — ABNORMAL HIGH (ref 0.44–1.00)
GFR calc Af Amer: 60 mL/min — ABNORMAL LOW (ref >60)
GFR calc non Af Amer: 52 mL/min — ABNORMAL LOW (ref >60)
Glucose, Bld: 91 mg/dL (ref 70–99)
Phosphorus: 1.4 mg/dL — ABNORMAL LOW (ref 2.5–4.6)
Potassium: 3.8 mmol/L (ref 3.5–5.1)
Sodium: 135 mmol/L (ref 135–145)

## 2020-02-15 LAB — FOLATE: Folate: 11.4 ng/mL (ref >5.9)

## 2020-02-15 LAB — CBC
HCT: 29.4 % — ABNORMAL LOW (ref 36.0–46.0)
Hemoglobin: 9.5 g/dL — ABNORMAL LOW (ref 12.0–15.0)
MCH: 30.3 pg (ref 26.0–34.0)
MCHC: 32.3 g/dL (ref 30.0–36.0)
MCV: 93.6 fL (ref 80.0–100.0)
Platelets: 153 10*3/uL (ref 150–400)
RBC: 3.14 MIL/uL — ABNORMAL LOW (ref 3.87–5.11)
RDW: 15.4 % (ref 11.5–15.5)
WBC: 10.3 10*3/uL (ref 4.0–10.5)
nRBC: 0 % (ref 0.0–0.2)

## 2020-02-15 LAB — CULTURE, BLOOD (ROUTINE X 2)
Culture: NO GROWTH
Culture: NO GROWTH
Special Requests: ADEQUATE

## 2020-02-15 LAB — GLUCOSE, CAPILLARY
Glucose-Capillary: 42 mg/dL — CL (ref 70–99)
Glucose-Capillary: 60 mg/dL — ABNORMAL LOW (ref 70–99)
Glucose-Capillary: 94 mg/dL (ref 70–99)
Glucose-Capillary: 69 mg/dL — ABNORMAL LOW (ref 70–99)
Glucose-Capillary: 80 mg/dL (ref 70–99)
Glucose-Capillary: 80 mg/dL (ref 70–99)
Glucose-Capillary: 54 mg/dL — ABNORMAL LOW (ref 70–99)
Glucose-Capillary: 127 mg/dL — ABNORMAL HIGH (ref 70–99)

## 2020-02-15 LAB — PROTIME-INR
INR: 1.3 — ABNORMAL HIGH (ref 0.8–1.2)
Prothrombin Time: 15.8 seconds — ABNORMAL HIGH (ref 11.4–15.2)

## 2020-02-15 LAB — HEPATIC FUNCTION PANEL
ALT: 215 U/L — ABNORMAL HIGH (ref 0–44)
AST: 143 U/L — ABNORMAL HIGH (ref 15–41)
Albumin: 1.9 g/dL — ABNORMAL LOW (ref 3.5–5.0)
Alkaline Phosphatase: 107 U/L (ref 38–126)
Bilirubin, Direct: 0.3 mg/dL — ABNORMAL HIGH (ref 0.0–0.2)
Indirect Bilirubin: 0.4 mg/dL (ref 0.3–0.9)
Total Bilirubin: 0.7 mg/dL (ref 0.3–1.2)
Total Protein: 5.9 g/dL — ABNORMAL LOW (ref 6.5–8.1)

## 2020-02-15 LAB — CK: Total CK: 954 U/L — ABNORMAL HIGH (ref 38–234)

## 2020-02-15 LAB — LIPASE, BLOOD: Lipase: 349 U/L — ABNORMAL HIGH (ref 11–51)

## 2020-02-15 LAB — FERRITIN: Ferritin: 531 ng/mL — ABNORMAL HIGH (ref 11–307)

## 2020-02-15 LAB — IRON AND TIBC
Iron: 11 ug/dL — ABNORMAL LOW (ref 28–170)
Saturation Ratios: 4 % — ABNORMAL LOW (ref 10.4–31.8)
TIBC: 253 ug/dL (ref 250–450)
UIBC: 242 ug/dL

## 2020-02-15 LAB — MAGNESIUM: Magnesium: 1.5 mg/dL — ABNORMAL LOW (ref 1.7–2.4)

## 2020-02-15 LAB — VITAMIN B12: Vitamin B-12: 1489 pg/mL — ABNORMAL HIGH (ref 180–914)

## 2020-02-15 MED ORDER — DEXTROSE 5 % IV SOLN: INTRAVENOUS | Status: DC

## 2020-02-15 MED ORDER — INSULIN ASPART 100 UNIT/ML ~~LOC~~ SOLN: 0.0000 [IU] | Freq: Every day | SUBCUTANEOUS | Status: DC

## 2020-02-15 MED ORDER — NICOTINE 21 MG/24HR TD PT24
21.0000 mg | MEDICATED_PATCH | Freq: Every day | TRANSDERMAL | Status: DC
  Administered 2020-02-15 – 2020-02-17 (×3): 21 mg via TRANSDERMAL
  Filled 2020-02-15 (×7): qty 1

## 2020-02-15 MED ORDER — IOHEXOL 300 MG/ML  SOLN
100.0000 mL | Freq: Once | INTRAMUSCULAR | Status: AC | PRN
  Administered 2020-02-15: 100 mL via INTRAVENOUS

## 2020-02-15 MED ORDER — DEXTROSE 5 % IV SOLN: INTRAVENOUS | Status: AC

## 2020-02-15 MED ORDER — SODIUM CHLORIDE 0.9 % IV SOLN
3.0000 g | Freq: Once | INTRAVENOUS | Status: AC
  Administered 2020-02-15: 3 g via INTRAVENOUS
  Filled 2020-02-15: qty 30

## 2020-02-15 MED ORDER — INSULIN ASPART 100 UNIT/ML ~~LOC~~ SOLN: 0.0000 [IU] | Freq: Three times a day (TID) | SUBCUTANEOUS | Status: DC

## 2020-02-15 MED ORDER — INSULIN ASPART 100 UNIT/ML ~~LOC~~ SOLN
0.0000 [IU] | Freq: Three times a day (TID) | SUBCUTANEOUS | Status: DC
  Administered 2020-02-17: 2 [IU] via SUBCUTANEOUS
  Administered 2020-02-17: 18:00:00 3 [IU] via SUBCUTANEOUS
  Administered 2020-02-18: 13:00:00 2 [IU] via SUBCUTANEOUS
  Administered 2020-02-18: 1 [IU] via SUBCUTANEOUS
  Administered 2020-02-18 – 2020-02-19 (×2): 2 [IU] via SUBCUTANEOUS
  Administered 2020-02-19 (×2): 3 [IU] via SUBCUTANEOUS
  Administered 2020-02-20 (×2): 4 [IU] via SUBCUTANEOUS
  Administered 2020-02-20: 2 [IU] via SUBCUTANEOUS
  Administered 2020-02-21: 18:00:00 4 [IU] via SUBCUTANEOUS
  Administered 2020-02-21: 2 [IU] via SUBCUTANEOUS
  Administered 2020-02-21: 13:00:00 3 [IU] via SUBCUTANEOUS
  Administered 2020-02-22 (×2): 4 [IU] via SUBCUTANEOUS
  Administered 2020-02-22 – 2020-02-23 (×2): 2 [IU] via SUBCUTANEOUS
  Administered 2020-02-23 (×2): 3 [IU] via SUBCUTANEOUS
  Administered 2020-02-24 (×3): 2 [IU] via SUBCUTANEOUS
  Administered 2020-02-25 (×3): 1 [IU] via SUBCUTANEOUS

## 2020-02-15 MED ORDER — INSULIN ASPART 100 UNIT/ML ~~LOC~~ SOLN
0.0000 [IU] | Freq: Every day | SUBCUTANEOUS | Status: DC
  Administered 2020-02-16: 2 [IU] via SUBCUTANEOUS
  Administered 2020-02-17: 4 [IU] via SUBCUTANEOUS
  Administered 2020-02-18 – 2020-02-19 (×2): 3 [IU] via SUBCUTANEOUS
  Administered 2020-02-20 – 2020-02-21 (×2): 5 [IU] via SUBCUTANEOUS
  Administered 2020-02-22: 3 [IU] via SUBCUTANEOUS
  Administered 2020-02-23: 5 [IU] via SUBCUTANEOUS
  Administered 2020-02-24: 23:00:00 3 [IU] via SUBCUTANEOUS

## 2020-02-15 MED ORDER — SODIUM PHOSPHATES 45 MMOLE/15ML IV SOLN
30.0000 mmol | Freq: Once | INTRAVENOUS | Status: AC
  Administered 2020-02-15: 30 mmol via INTRAVENOUS
  Filled 2020-02-15: qty 10

## 2020-02-15 MED ORDER — VITAMIN D (ERGOCALCIFEROL) 1.25 MG (50000 UNIT) PO CAPS
50000.0000 [IU] | ORAL_CAPSULE | ORAL | Status: DC
  Administered 2020-02-15 – 2020-02-22 (×2): 50000 [IU] via ORAL
  Filled 2020-02-15 (×2): qty 1

## 2020-02-15 MED ORDER — ENSURE ENLIVE PO LIQD
237.0000 mL | Freq: Three times a day (TID) | ORAL | Status: DC
  Administered 2020-02-15 – 2020-02-17 (×3): 237 mL via ORAL

## 2020-02-15 NOTE — Progress Notes (Signed)
Hypoglycemic Event  CBG: 57  Treatment: D50 25 mL (12.5 gm)  Symptoms: Hungry  Follow-up CBG: Time:2100 CBG Result:73  Possible Reasons for Event: Inadequate meal intake  Comments/MD notified: Patient NPO since 1345 for abdominal US.  She also states that she only takes long acting insulin once a day at home rather than BID.    Jodell Cipro

## 2020-02-15 NOTE — Progress Notes (Signed)
PROGRESS NOTE  Sarah Long [id number] DOB: 1971/06/10   PCP: [name], MD  Patient is from: home  DOA: 02/09/2020 LOS: 5  Brief Narrative / Interim history: 49 yr old obese F w/ significant PMHx alcohol abuse, tobacco abuse, IDDM, HTN, HLD and morbid obesity presented to [facility] with 10/10 chest pain. Per the triage note pt reported vomiting all day, being short of breath and having severe chest pain feeling like she was going to pass out. She denied loss of consciousness or syncopal episode.   Her initial Vitals were 133/66mmHg HR 41 RR 26 O2 sat 94% .  CBG>600mg /dl.  Bicarb obtained.  AG 30. Na 131.  K5.3. Cr 2.85.  Lipase 1879 AST 582.  ALT 294.  EKG showed an  Idioventricular rhythm with LAD, AVB, LVH and an inferior infarct (QRS 162 ms and QT/QTc 574/467 ms).  Troponin 730>> 2000.   Patient went into complete AVB with HR of 39 while in ED.  Started on external pacer.  Done she becomes somnolent after IV Versed requiring intubation.  Cardiology consulted and she was admitted to ICU.  She was also started on Unasyn for possible aspiration pneumonia.  Patient underwent LHC on 3/7 which was negative for CAD.  She had temporary venous pacemaker advanced at the same time.  Echo with EF of 40 to 45%, global hypokinesis, severe LVH and indeterminate diastolic function but no other significant finding.  She eventually had permanent PPM/ICD implant on 02/13/2020.  Patient was extubated on 02/11/2020, and has been vomiting.  Patient also with acute uncomplicated pancreatitis as noted on CT abdomen and pelvis on 02/10/2020.  See individual problem list below for more hospital course.  Subjective: No major events overnight or this morning.  Feels tired.  Also short of breath intermittently.  Denies chest pain, nausea, vomiting and abdominal pain.  Feels her abdomen is distended.  She says she had a bowel movement earlier this morning which was loose.   Objective: Vitals:   02/14/20 0810  02/14/20 1149 02/14/20 1959 02/15/20 0425  BP: 125/85 (!) 142/91 139/90 133/77  Pulse: 92 93 95 88  Resp: 18 18 16  (!) 23  Temp: 97.6 F (36.4 C) 97.8 F (36.6 C) 98.2 F (36.8 C) 97.6 F (36.4 C)  TempSrc: Oral Oral Oral Oral  SpO2:  99% 97% 97%  Weight:    106.6 kg  Height:        Intake/Output Summary (Last 24 hours) at 02/15/2020 1244 Last data filed at 02/15/2020 0604 Gross per 24 hour  Intake 1326.18 ml  Output --  Net 1326.18 ml   Filed Weights   02/12/20 0500 02/13/20 0500 02/15/20 0425  Weight: 106.5 kg 102 kg 106.6 kg    Examination:  GENERAL: No acute distress.  Appears well.  HEENT: MMM.  Vision and hearing grossly intact.  NECK: Supple.  Difficult to assess JVD RESP: On room air.  No IWOB.  Fair aeration bilaterally.  Poor effort on incentive spirometry. CVS:  RRR. Heart sounds normal.  ABD/GI/GU: Bowel sounds present. Non tender but somewhat distended MSK/EXT:  Moves extremities. No apparent deformity.  Trace edema bilaterally. SKIN: Dressing over left upper chest anteriorly dry, clean and intact. NEURO: Awake, alert and oriented appropriately.  No apparent focal neuro deficit. PSYCH: Calm. Normal affect.  Procedures:  ETT 3/7-3/8 LHC/temporary internal venous pacer on 3/7 PPM/ICD placement on 3/10 2D echo on 3/7-LVEF 40 to 45%, global hypokinesis.  Severe LVH  Assessment & Plan: Complete  Heart Block/bifascicular block (RBBB and LAFB)  -s/p PPM and ICD on 02/13/2020.  Device interrogation normal. -Cardiology following.  Non-STEMI: presented with severe chest pain. Troponin elevated to 730> 2000. EKG showed an  Idioventricular rhythm with LAD, AVB, LVH and an inferior infarct (QRS 162 ms and QT/QTc 574/467 ms). LHC on 3/5 without CAD.  Now chest pain-free.  Intermittent shortness of breath. -LHC on 3/7 without CAD.  -Optimize risk reduction  New systolic CHF: Echo on 3/7 with LVEF of 40 to 45%, global hypokinesis, severe LVH.  Repeat CXR on 3/11 with  improved aeration with decreasing vascular congestion and bibasilar atelectasis.  On room air.  Trace edema on exam.  Reports intermittent shortness of breath which is likely from atelectasis -PPM/ICD as above -Monitor fluid status, renal function and electrolytes.  Acute hypoxic respiratory failure: Was intubated due to somnolence after IV Versed on presentation.  Also new CHF and possible aspiration pneumonia. Now on room air but feels short of breath intermittently likely due to atelectasis -Switched to p.o. Augmentin to complete antibiotic course for possible aspiration pneumonia -Encouraged incentive spirometry  Uncontrolled DM-2 with hypoglycemia: Presented with DKA that has resolved.  A1c 12.7%.  Now with refractory hypoglycemia.  Recent Labs    02/15/20 0546 02/15/20 0803 02/15/20 1146  GLUCAP 60* 94 69*  -Discontinue basal insulin -Reduce SSI to thin -Continue D5 at 50 cc an hour -Appreciate insight by diabetic coordinator -Check insulin level and C-peptide level  Acute alcoholic pancreatitis: CT abdomen and pelvis on 3/7 with acute uncomplicated pancreatitis, fatty liver and gallbladder sludge.  Lipase 1900> 941.  AST/ALT 582/294 but improving.  ALP and total bili within normal.  RUQ [place] with steatosis.  Triglyceride normal.  Calcium is very low likely due to acute pancreatitis.  Abdominal pain improved -Repeat CT abdomen and pelvis -Trend lipase and LFT -Adjust diet as tolerated -Replenish calcium -Will involve GI if CT abdomen pelvis concerning. -Hold statin.  Acute kidney injury: Cr 2.85 (admit) >>> 1.21> 1.25.  Likely a combination of cardiorenal and iatrogenic from ARB/HCTZ and NSAIDs.  Improving. -Continue monitoring -Avoid nephrotoxic meds -Continue monitoring  Hypocalcemia: Calcium 4.4.  Ionized Ca 0.71. Likely due to pancreatitis, renal failure and possibly due to rhabdo.  Phosphorus low. -Calcium gluconate 3 g -Repeat CT for acute pancreatitis -Gentle IV  fluids for rhabdomyolysis -Check urine calcium to creatinine ratio -Check PTH  Rhabdomyolysis: CK 855> 954 -Fractionate CK-could be cardiac source. -Continue gentle IV fluid -Hold statin  Hyponatremia in the setting of AKI: Resolved. -Continue monitoring  Hypokalemia-resolved. -Continue monitoring  Hypophosphatemia: Likely due to poor p.o. intake. -Replenish and recheck. -Consult dietitian  Lactic acidosis: LA > 11 on admit>>> 2.1.  Normocytic anemia: Hgb 10.5 (admit)>> 10.2> 9.5 -Check anemia panel  Alcohol use disorder: Reportedly drinks about 5-6 drinks including liquors -Counseled. -Consult TOC for resources  Tobacco use disorder: Smokes about a pack a day. -Cessation counseling -Nicotine patch  Morbid obesity: Body mass index is 40.34 kg/m. -Encourage lifestyle change to lose weight -Consult dietitian  Nutrition:  -Dietitian consulted.                DVT prophylaxis: Subcu heparin Code Status: Full code Family Communication: Patient and/or RN. Available if any question.   Discharge barrier: Acute pancreatitis, AKI, electrolyte derangements, rhabdomyolysis.  Very high risk for deconditioning.  Requires close monitoring for replenishment of electrolytes.  Patient is from: Home Final disposition: Likely home when medically stable  Consultants: PCCM (off), cardiology, EP,  Microbiology summarized: Influenza PCR negative COVID-19 negative MRSA PCR negative Blood cultures negative  Sch Meds:  Scheduled Meds: . amoxicillin-clavulanate  1 tablet Oral Q12H  . aspirin  81 mg Oral Daily  . Chlorhexidine Gluconate Cloth  6 each Topical Daily  . folic acid  1 mg Oral Daily  . heparin  5,000 Units Subcutaneous Q8H  . insulin aspart  0-5 Units Subcutaneous QHS  . insulin aspart  0-6 Units Subcutaneous TID WC  . nicotine  21 mg Transdermal Daily  . pantoprazole  40 mg Oral Daily  . sodium chloride flush  10-40 mL Intracatheter Q12H  . sodium  chloride flush  3 mL Intravenous Q12H   Continuous Infusions: . sodium chloride Stopped (02/13/20 0820)  . sodium chloride    . sodium chloride 250 mL (02/14/20 0043)  . sodium chloride 60 mL/hr at 02/14/20 1826  . dextrose 50 mL/hr at 02/15/20 0512  . sodium phosphate  Dextrose 5% IVPB     PRN Meds:.sodium chloride, Place/Maintain arterial line **AND** sodium chloride, sodium chloride, acetaminophen, dextrose, fentaNYL (SUBLIMAZE) injection, Melatonin, ondansetron (ZOFRAN) IV, sodium chloride flush, sodium chloride flush  Antimicrobials: Anti-infectives (From admission, onward)   Start     Dose/Rate Route Frequency Ordered Stop   02/14/20 1345  amoxicillin-clavulanate (AUGMENTIN) 875-125 MG per tablet 1 tablet     1 tablet Oral Every 12 hours 02/14/20 1337 02/18/20 0959   02/13/20 2000  ceFAZolin (ANCEF) IVPB 1 g/50 mL premix     1 g 100 mL/hr over 30 Minutes Intravenous Every 6 hours 02/13/20 1740 02/14/20 0927   02/13/20 1702  gentamicin (GARAMYCIN) 80 mg in sodium chloride 0.9 % 500 mL irrigation  Status:  Discontinued       As needed 02/13/20 1703 02/13/20 1719   02/13/20 0045  gentamicin (GARAMYCIN) 80 mg in sodium chloride 0.9 % 500 mL irrigation  Status:  Discontinued     80 mg Irrigation On call 02/13/20 0042 02/13/20 1734   02/13/20 0045  ceFAZolin (ANCEF) IVPB 2g/100 mL premix     2 g 200 mL/hr over 30 Minutes Intravenous On call 02/13/20 0042 02/13/20 1615   02/11/20 1130  Ampicillin-Sulbactam (UNASYN) 3 g in sodium chloride 0.9 % 100 mL IVPB  Status:  Discontinued     3 g 200 mL/hr over 30 Minutes Intravenous Every 6 hours 02/11/20 1115 02/14/20 1337       I have personally reviewed the following labs and images: CBC: Recent Labs  Lab 02/10/20 0002 02/10/20 0039 02/10/20 0344 02/10/20 0553 02/10/20 1213 02/11/20 0436 02/13/20 0437 02/13/20 0551 02/15/20 0728  WBC 13.0*  --  14.2*  --   --  13.4* 10.3  --  10.3  NEUTROABS  --   --   --   --   --  10.0*  --    --   --   HGB 11.8*   < > 11.7*   < > 10.5* 11.4* 9.4* 10.2* 9.5*  HCT 41.0   < > 38.2   < > 31.0* 33.9* 29.4* 30.0* 29.4*  MCV 103.8*  --  97.7  --   --  89.7 93.3  --  93.6  PLT 255  --  207  --   --  204 159  --  153   < > = values in this interval not displayed.   BMP &GFR Recent Labs  Lab 02/10/20 1805 02/10/20 2153 02/11/20 [id number] 02/11/20 [id number] 02/12/20 [id number] 02/12/20 [id number] 02/12/20 [id number] 02/13/20 [id number]  02/13/20 0551 02/13/20 0559 02/14/20 0416 02/15/20 0728  NA   < >  --  133*   < > 134*   < > 137 140 140  --  139 135  K   < >  --  4.0   < > 3.4*   < > 3.5 3.7 3.6  --  3.4* 3.8  CL   < >  --  103   < > 105   < > 108 108 108  --  109 107  CO2   < >  --  17*   < > 18*  --  20* 20*  --   --  20* 18*  GLUCOSE   < >  --  144*   < > 152*   < > 148* 208* 187*  --  81 91  BUN   < >  --  27*   < > 26*   < > 23* 19 17  --  12 8  CREATININE   < >  --  1.91*   < > 1.62*   < > 1.40* 1.27* 1.10*  --  1.21* 1.25*  CALCIUM   < >  --  6.6*   < > 6.2*  --  5.9* 5.3*  --   --  4.9* 4.4*  MG  --  1.2* 1.8  --   --   --  1.7  --   --  2.2  --   --   PHOS  --  2.7 3.1  --   --   --   --   --   --   --   --  1.4*   < > = values in this interval not displayed.   Estimated Creatinine Clearance: 67 mL/min (A) (by C-G formula based on SCr of 1.25 mg/dL (H)). Liver & Pancreas: Recent Labs  Lab 02/10/20 0700 02/10/20 2019 02/11/20 0436 02/14/20 1350 02/15/20 0728  AST 736* 582*  --  243* 143*  ALT 275* 294*  --  337* 215*  ALKPHOS 68 58  --  103 107  BILITOT 1.0 0.7  --  0.6 0.7  PROT 5.5* 5.5*  --  5.8* 5.9*  ALBUMIN 2.6* 2.5* 2.3* 1.9* 1.9*  1.9*   Recent Labs  Lab 02/10/20 2153 02/14/20 0416  LIPASE 1,879* 941*   No results for input(s): AMMONIA in the last 168 hours. Diabetic: No results for input(s): HGBA1C in the last 72 hours. Recent Labs  Lab 02/14/20 2336 02/15/20 0420 02/15/20 0546 02/15/20 0803 02/15/20 1146  GLUCAP 83 42* 60* 94 69*   Cardiac Enzymes: Recent  Labs  Lab 02/14/20 1350 02/15/20 0728  CKTOTAL 855* 954*   No results for input(s): PROBNP in the last 8760 hours. Coagulation Profile: Recent Labs  Lab 02/15/20 1143  INR 1.3*   Thyroid Function Tests: No results for input(s): TSH, T4TOTAL, FREET4, T3FREE, THYROIDAB in the last 72 hours. Lipid Profile: No results for input(s): CHOL, HDL, LDLCALC, TRIG, CHOLHDL, LDLDIRECT in the last 72 hours. Anemia Panel: No results for input(s): VITAMINB12, FOLATE, FERRITIN, TIBC, IRON, RETICCTPCT in the last 72 hours. Urine analysis:    Component Value Date/Time   COLORURINE YELLOW 02/10/2020 0617   APPEARANCEUR CLOUDY (A) 02/10/2020 0617   LABSPEC 1.015 02/10/2020 0617   PHURINE 5.0 02/10/2020 0617   GLUCOSEU >=500 (A) 02/10/2020 0617   HGBUR MODERATE (A) 02/10/2020 0617   BILIRUBINUR NEGATIVE 02/10/2020 0617   BILIRUBINUR negative 07/10/2019 1445  BILIRUBINUR neg 09/28/2017 0907   KETONESUR NEGATIVE 02/10/2020 0617   PROTEINUR 100 (A) 02/10/2020 0617   UROBILINOGEN 0.2 07/10/2019 1445   UROBILINOGEN 0.2 07/16/2015 2024   NITRITE NEGATIVE 02/10/2020 0617   LEUKOCYTESUR NEGATIVE 02/10/2020 0617   Sepsis Labs: Invalid input(s): PROCALCITONIN, [place]  Microbiology: Recent Results (from the past 240 hour(s))  Respiratory Panel by RT PCR (Flu A&B, Covid) - Nasopharyngeal Swab     Status: None   Collection Time: 02/10/20 12:30 AM   Specimen: Nasopharyngeal Swab  Result Value Ref Range Status   SARS Coronavirus 2 by RT PCR NEGATIVE NEGATIVE Final    Comment: (NOTE) SARS-CoV-2 target nucleic acids are NOT DETECTED. The SARS-CoV-2 RNA is generally detectable in upper respiratoy specimens during the acute phase of infection. The lowest concentration of SARS-CoV-2 viral copies this assay can detect is 131 copies/mL. A negative result does not preclude SARS-Cov-2 infection and should not be used as the sole basis for treatment or other patient management decisions. A negative  result may occur with  improper specimen collection/handling, submission of specimen other than nasopharyngeal swab, presence of viral mutation(s) within the areas targeted by this assay, and inadequate number of viral copies (<131 copies/mL). A negative result must be combined with clinical observations, patient history, and epidemiological information. The expected result is Negative. Fact Sheet for Patients:  PinkCheek.be Fact Sheet for Healthcare Providers:  GravelBags.it This test is not yet ap proved or cleared by the [place] FDA and  has been authorized for detection and/or diagnosis of SARS-CoV-2 by FDA under an Emergency Use Authorization (EUA). This EUA will remain  in effect (meaning this test can be used) for the duration of the COVID-19 declaration under Section 564(b)(1) of the Act, 21 U.S.C. section 360bbb-3(b)(1), unless the authorization is terminated or revoked sooner.    Influenza A by PCR NEGATIVE NEGATIVE Final   Influenza B by PCR NEGATIVE NEGATIVE Final    Comment: (NOTE) The Xpert Xpress SARS-CoV-2/FLU/RSV assay is intended as an aid in  the diagnosis of influenza from Nasopharyngeal swab specimens and  should not be used as a sole basis for treatment. Nasal washings and  aspirates are unacceptable for Xpert Xpress SARS-CoV-2/FLU/RSV  testing. Fact Sheet for Patients: PinkCheek.be Fact Sheet for Healthcare Providers: GravelBags.it This test is not yet approved or cleared by the [place] FDA and  has been authorized for detection and/or diagnosis of SARS-CoV-2 by  FDA under an Emergency Use Authorization (EUA). This EUA will remain  in effect (meaning this test can be used) for the duration of the  Covid-19 declaration under Section 564(b)(1) of the Act, 21  U.S.C. section 360bbb-3(b)(1), unless the authorization is  terminated or  revoked. Performed at [facility], [place] [address], [place], [place] [address]   MRSA PCR Screening     Status: None   Collection Time: 02/10/20  3:40 AM   Specimen: Nasal Mucosa; Nasopharyngeal  Result Value Ref Range Status   MRSA by PCR NEGATIVE NEGATIVE Final    Comment:        The GeneXpert MRSA Assay (FDA approved for NASAL specimens only), is one component of a comprehensive MRSA colonization surveillance program. It is not intended to diagnose MRSA infection nor to guide or monitor treatment for MRSA infections. Performed at [facility], [place] [address] [place], [place] [address]   Culture, blood (routine x 2)     Status: None   Collection Time: 02/10/20  9:26 PM  Specimen: BLOOD RIGHT HAND  Result Value Ref Range Status   Specimen Description BLOOD RIGHT HAND  Final   Special Requests   Final    BOTTLES DRAWN AEROBIC ONLY Blood Culture results may not be optimal due to an inadequate volume of blood received in culture bottles   Culture   Final    NO GROWTH 5 DAYS Performed at [facility], [place] [address] [place], [place] [address]    Report Status 02/15/2020 FINAL  Final  Culture, blood (routine x 2)     Status: None   Collection Time: 02/10/20  9:27 PM   Specimen: BLOOD  Result Value Ref Range Status   Specimen Description BLOOD RIGHT ANTECUBITAL  Final   Special Requests   Final    BOTTLES DRAWN AEROBIC ONLY Blood Culture adequate volume   Culture   Final    NO GROWTH 5 DAYS Performed at [facility], [place] [address], [place], [place] [address]    Report Status 02/15/2020 FINAL  Final  Surgical PCR screen     Status: None   Collection Time: 02/13/20  1:11 AM   Specimen: Nasal Mucosa; Nasal Swab  Result Value Ref Range Status   MRSA, PCR NEGATIVE NEGATIVE Final   Staphylococcus aureus NEGATIVE NEGATIVE Final    Comment: (NOTE) The Xpert SA Assay (FDA approved for NASAL specimens in patients 51 years of age and older), is one  component of a comprehensive surveillance program. It is not intended to diagnose infection nor to guide or monitor treatment. Performed at [facility], [place] [address], [place], [place] [address]     Radiology Studies: US Abdomen Limited RUQ  Result Date: 02/14/2020 CLINICAL DATA:  Acute pancreatitis EXAM: ULTRASOUND ABDOMEN LIMITED RIGHT UPPER QUADRANT COMPARISON:  None. FINDINGS: Gallbladder: No gallstones or wall thickening visualized. No sonographic Murphy sign noted by sonographer. Common bile duct: Diameter: 4 mm, within normal limits. Liver: No focal lesion identified. Increased parenchymal echogenicity. Portal vein is patent on color Doppler imaging with normal direction of blood flow towards the liver. Other: Small volume perihepatic and pericholecystic fluid. IMPRESSION: Increased liver echogenicity likely reflecting steatosis. No dilatation of the common bile duct. No evidence of acute cholecystitis. Electronically Signed   By: Macy Mis M.D.   On: 02/14/2020 20:51     Jenita Rayfield T. [place]  If 7PM-7AM, please contact night-coverage [email or web address] Password [facility] 02/15/2020, 12:44 PM

## 2020-02-15 NOTE — Progress Notes (Signed)
Initial Nutrition Assessment  DOCUMENTATION CODES:   Obesity unspecified  INTERVENTION:   Recommend liberalizing diet to regular  Ensure Enlive po TID, each supplement provides 350 kcal and 20 grams of protein    NUTRITION DIAGNOSIS:   Inadequate oral intake related to poor appetite as evidenced by per patient/family report.    GOAL:   Patient will meet greater than or equal to 90% of their needs    MONITOR:   PO intake, Supplement acceptance, Weight trends, Labs, I & O's  REASON FOR ASSESSMENT:   Consult Assessment of nutrition requirement/status  ASSESSMENT:   Pt with a PMH significant for EtOH abuse, tobacco abuse, IDDM, HTN, HLD who presented with chest pain, vomiting, and shortness of breath. Pt went into complete heart block then became somnolent post versed requiring intubation.  3/7 intubated, left heart cath, TVP placed 3/8 extubated 3/10 s/p PPM and ICD  Pt also with acute uncomplicated pancreatitis and rhabdomyolysis.  Pt reports poor appetite today and states that the food options she has are unappealing. Pt reports good appetite PTA, states she ate two meals a day, often fast food options like [employer]. Pt states she "has never been a breakfast person." RD sent secure chat to MD regarding diet liberalization in hopes of additional diet options increasing PO intake. Pt is agreeable to Ensure while admitted.  PO Intake: 0-5% x 2 recorded meals  Medications reviewed and include: Folvite, SSI, D5% @ 19ml/hr  Labs reviewed: Corrected calcium 6.08 (L), Phosphorus 1.4 (L) CBGs 42-94  Admit weight: 95.2 kg Current weight: 106.6 kg Pt noted to have generalized edema, will use admit weight to estimate needs.  NUTRITION - FOCUSED PHYSICAL EXAM:    Most Recent Value  Orbital Region  No depletion  Upper Arm Region  No depletion  Thoracic and Lumbar Region  No depletion  Buccal Region  No depletion  Temple Region  No depletion  Clavicle Bone Region   No depletion  Clavicle and Acromion Bone Region  No depletion  Scapular Bone Region  No depletion  Dorsal Hand  No depletion  Patellar Region  No depletion  Anterior Thigh Region  No depletion  Posterior Calf Region  No depletion  Edema (RD Assessment)  Moderate  Hair  Reviewed  Eyes  Reviewed  Mouth  Reviewed  Skin  Reviewed  Nails  Reviewed       Diet Order:   Diet Order            Diet heart healthy/carb modified Room service appropriate? Yes; Fluid consistency: Thin  Diet effective now              EDUCATION NEEDS:   No education needs have been identified at this time  Skin:  Skin Assessment: Reviewed RN Assessment  Last BM:  3/11  Height:   Ht Readings from Last 1 Encounters:  02/10/20 5\' 4"  (1.626 m)    Weight:   Wt Readings from Last 5 Encounters:  02/15/20 106.6 kg  10/02/19 95.2 kg  09/26/19 99.8 kg  09/25/19 98.9 kg  07/10/19 93.5 kg    BMI:  Body mass index is 40.34 kg/m.  Estimated Nutritional Needs:   Kcal:  1900-2100  Protein:  115-130 grams  Fluid:  >/= 1.9L    Larkin Ina, MS, RD, LDN RD pager number and weekend/on-call pager number located in [phone].

## 2020-02-15 NOTE — Progress Notes (Signed)
Hypoglycemic Event  CBG: 42  Treatment: D50 25 mL (12.5 gm)  Symptoms: Sweaty and Shaky  Follow-up CBG: Time:0546 CBG Result:60  Possible Reasons for Event: Inadequate meal intake  Comments/MD notified: Coke given.  Jeannette Corpus notified.  IVF changed to D5 @ 50 mL/hr.    Jodell Cipro

## 2020-02-15 NOTE — Progress Notes (Signed)
Occupational Therapy Treatment Patient Details Name: Sarah Long MRN: [id number] DOB: 09-Nov-1971 Today's Date: 02/15/2020    History of present illness 49 y/o female w/ hx of obesity, HTN, HSV-2 infection, Herpes, dizzyness, DM, depression, bipolar 1 disorder, temp pacemaker (02/10/20), pacemake implant (02/13/20). Pt had presented to hospital with c/o 10/10 chest pain and elevated troponin, pt was dx with complete AV block, s/p new pacemaker (3/10)   OT comments  This 49 yo female seen today for AE education and bed mobility education with pacemaker precautions. Pt with SOB when I entered and said she has just gotten back to bed from [facility] where she had be throwing up and RN had given her Zofran. Pt wanted to know how to get OOB from a flat bed and she did this wonderfully when talked through how to do it. Also started education on AE use for LB ADLs. Pt gets SOB with minimal activity. Pt will continue to benefit from acute OT with follow up [place] if she can get it without insurance.   Follow Up Recommendations  Home health OT;Supervision - Intermittent    Equipment Recommendations  3 in 1 bedside commode;Tub/shower seat       Precautions / Restrictions Precautions Precautions: Fall;ICD/Pacemaker Restrictions Weight Bearing Restrictions: Yes LUE Weight Bearing: Partial weight bearing LUE Partial Weight Bearing Percentage or Pounds: pacemaker precautions Other Position/Activity Restrictions: easily SOB       Mobility Bed Mobility Overal bed mobility: Needs Assistance Bed Mobility: Rolling;Sidelying to Sit Rolling: Modified independent (Device/Increase time) Sidelying to sit: Modified independent (Device/Increase time)       General bed mobility comments: Pt stated she was not sure how she was going to get up from a flat bed at home. We worked through her rolling to left and push up to sit with RUE  Transfers Overall transfer level: Needs assistance Equipment used: None Transfers:  Sit to/from Stand Sit to Stand: Supervision         General transfer comment: VCs to not push up with LUE    Balance Overall balance assessment: Needs assistance Sitting-balance support: Feet supported Sitting balance-Leahy Scale: Good     Standing balance support: No upper extremity supported Standing balance-Leahy Scale: Fair Standing balance comment: for static standing                           ADL either performed or assessed with clinical judgement   ADL Overall ADL's : Needs assistance/impaired                     Lower Body Dressing: Minimal assistance;With adaptive equipment Lower Body Dressing Details (indicate cue type and reason): S  sit<>stand, intiated education on AE use (sock aid for sock donning and reacher for sock doffing)                     Vision Patient Visual Report: No change from baseline            Cognition   Behavior During Therapy: WFL for tasks assessed/performed Overall Cognitive Status: Within Functional Limits for tasks assessed                                                     Pertinent Vitals/ Pain  Pain Assessment: No/denies pain         Frequency  Min 2X/week        Progress Toward Goals  OT Goals(current goals can now be found in the care plan section)  Progress towards OT goals: Progressing toward goals     Plan Discharge plan remains appropriate       AM-PAC OT "6 Clicks" Daily Activity     Outcome Measure   Help from another person eating meals?: None Help from another person taking care of personal grooming?: A Little Help from another person toileting, which includes using toliet, bedpan, or urinal?: A Little Help from another person bathing (including washing, rinsing, drying)?: A Little Help from another person to put on and taking off regular upper body clothing?: A Little Help from another person to put on and taking off regular lower body  clothing?: A Little(with AE) 6 Click Score: 19    End of Session    OT Visit Diagnosis: Unsteadiness on feet (R26.81);Muscle weakness (generalized) (M62.81)   Activity Tolerance Patient limited by fatigue   Patient Left in bed;with call bell/phone within reach   Nurse Communication (questioned blood sugar since last one was 69--RN to check and currently 80)        Time: [id number] OT Time Calculation (min): 35 min  Charges: OT General Charges $OT Visit: 1 Visit OT Treatments $Self Care/Home Management : 23-37 mins  Tye [place] , OTR/L [place] Pager [phone] Office [phone]      02/15/2020, 5:38 PM

## 2020-02-15 NOTE — Progress Notes (Addendum)
Physical Therapy Treatment Patient Details Name: Sarah Long MRN: [id number] DOB: 21-Feb-1971 Today's Date: 02/15/2020    History of Present Illness 49 y/o female w/ hx of obesity, HTN, HSV-2 infection, Herpes, dizzyness, DM, depression, bipolar 1 disorder, temp pacemaker (02/10/20), pacemake implant (02/13/20). Pt had presented to hospital with c/o 10/10 chest pain and elevated troponin, pt was dx with complete AV block, s/p new pacemaker (3/10)    PT Comments    Pt making great progress with tx and also mobility. Demonstrated increased independence, activity tolerance also balance and coordination. Was able to complete transfers with RW but needed reminders for precautions, and also able to ambulate approx 177ft in hall with RW and SBA/min guard assist. Pt c/o woozy feeling and blood sugar turned out to be low, nurse in room to encourage pt to drink some juice and attempt eating more of food on tray (pt had not eaten much of breakfast). Pt still with deficits in independence and activity tolerance hence safety with mobility. Will continue to work with pt and progress as she tolerates.      Follow Up Recommendations  Supervision for mobility/OOB     Equipment Recommendations  None recommended by PT    Recommendations for Other Services       Precautions / Restrictions  Precautions Precautions: Fall;ICD/Pacemaker Restrictions Weight Bearing Restrictions: Yes LUE Weight Bearing: Partial weight bearing      Mobility  Bed Mobility               General bed mobility comments: pt found sitting in chair   Transfers Overall transfer level: Needs assistance Equipment used: Rolling walker (2 wheeled) Transfers: Sit to/from Stand Sit to Stand: Supervision Stand pivot transfers: Supervision       General transfer comment: vc for safety and also line management assist  Ambulation/Gait Ambulation/Gait assistance: Min guard;Supervision Gait Distance (Feet): 170  Feet Assistive device: Rolling walker (2 wheeled) Gait Pattern/deviations: Step-through pattern;Wide base of support Gait velocity: dec   General Gait Details: ambulated in hall needed several standing rest breaks c/o some woozy feeling but when asked to return to room stated was ok to continue, HR increased to 102bpm with ambulation   Stairs             Wheelchair Mobility    Modified Rankin (Stroke Patients Only)       Balance Overall balance assessment: Needs assistance Sitting-balance support: Feet supported Sitting balance-Leahy Scale: Good     Standing balance support: During functional activity;Bilateral upper extremity supported Standing balance-Leahy Scale: Fair                              Cognition Arousal/Alertness: Awake/alert Behavior During Therapy: WFL for tasks assessed/performed;Anxious Overall Cognitive Status: Within Functional Limits for tasks assessed                                        Exercises      General Comments        Pertinent Vitals/Pain Pain Assessment: Faces Faces Pain Scale: Hurts little more Pain Location: abdomen/stomach Pain Descriptors / Indicators: Grimacing;Discomfort Pain Intervention(s): Limited activity within patient's tolerance;Monitored during session    Home Living                      Prior Function  PT Goals (current goals can now be found in the care plan section) Acute Rehab PT Goals Patient Stated Goal: get better PT Goal Formulation: With patient Time For Goal Achievement: 02/28/20 Potential to Achieve Goals: Good Progress towards PT goals: Progressing toward goals    Frequency    Min 3X/week      PT Plan Discharge plan needs to be updated    Co-evaluation              AM-PAC PT "6 Clicks" Mobility   Outcome Measure  Help needed turning from your back to your side while in a flat bed without using bedrails?: None Help needed  moving from lying on your back to sitting on the side of a flat bed without using bedrails?: A Little Help needed moving to and from a bed to a chair (including a wheelchair)?: A Little Help needed standing up from a chair using your arms (e.g., wheelchair or bedside chair)?: A Lot Help needed to walk in hospital room?: A Little Help needed climbing 3-5 steps with a railing? : A Lot 6 Click Score: 17    End of Session Equipment Utilized During Treatment: Gait belt Activity Tolerance: Patient tolerated treatment well Patient left: in chair;with call bell/phone within reach;with family/visitor present Nurse Communication: Mobility status PT Visit Diagnosis: Unsteadiness on feet (R26.81);Other abnormalities of gait and mobility (R26.89);Muscle weakness (generalized) (M62.81)     Time: [id number] PT Time Calculation (min) (ACUTE ONLY): 16 min  Charges:  $Gait Training: 8-22 mins $Therapeutic Activity: 8-22 mins                     Horald Chestnut, PT    Delford Field 02/15/2020, 12:47 PM

## 2020-02-15 NOTE — Progress Notes (Signed)
Dr. Cyndia Skeeters room  [phone] updated  With Calcium level at 4.4  Via secure chat.

## 2020-02-15 NOTE — Plan of Care (Signed)
  Problem: Clinical Measurements: Goal: Respiratory complications will improve Outcome: Progressing Goal: Cardiovascular complication will be avoided Outcome: Progressing   

## 2020-02-15 NOTE — Care Management (Signed)
1259 02-15-20 Patient presented for Acute Coronary Syndrome-Complete Heart block. Prior to arrival patient was from home with the support of husband. Patient is without insurnace at this time. Patient has a follow up appointment scheduled with the [facility] and [facility]Patient is aware that they have a pharmacy onsite and that medications will range from $4.00-$10.00 open Monday-Friday. Patient states her husband will provide transportation to appointments. Depending on medications that the patient transitions home with; patient may benefit from [facility]. Case Manager will continue to monitor for additional transition of care needs. Bethena Roys, RN,BSN Case Manager

## 2020-02-15 NOTE — Progress Notes (Signed)
Hypoglycemic Event  CBG: 60  Treatment: D50 25 mL (12.5 gm)  Symptoms: Sweaty and shaky  Follow-up CBG: Time: CBG Result:  Possible Reasons for Event: Poor po intake  Comments/MD notified: Patient c/o N/V; unable to eat or drink.  1/2 amp of D50 given.    Jodell Cipro

## 2020-02-16 DIAGNOSIS — E559 Vitamin D deficiency, unspecified: Secondary | ICD-10-CM

## 2020-02-16 DIAGNOSIS — D509 Iron deficiency anemia, unspecified: Secondary | ICD-10-CM

## 2020-02-16 LAB — GLUCOSE, CAPILLARY
Glucose-Capillary: 51 mg/dL — ABNORMAL LOW (ref 70–99)
Glucose-Capillary: 101 mg/dL — ABNORMAL HIGH (ref 70–99)
Glucose-Capillary: 132 mg/dL — ABNORMAL HIGH (ref 70–99)
Glucose-Capillary: 201 mg/dL — ABNORMAL HIGH (ref 70–99)

## 2020-02-16 LAB — CBC
HCT: 27.3 % — ABNORMAL LOW (ref 36.0–46.0)
Hemoglobin: 8.8 g/dL — ABNORMAL LOW (ref 12.0–15.0)
MCH: 29.9 pg (ref 26.0–34.0)
MCHC: 32.2 g/dL (ref 30.0–36.0)
MCV: 92.9 fL (ref 80.0–100.0)
Platelets: 188 10*3/uL (ref 150–400)
RBC: 2.94 MIL/uL — ABNORMAL LOW (ref 3.87–5.11)
RDW: 15 % (ref 11.5–15.5)
WBC: 11.4 10*3/uL — ABNORMAL HIGH (ref 4.0–10.5)
nRBC: 0.2 % (ref 0.0–0.2)

## 2020-02-16 LAB — RENAL FUNCTION PANEL
Albumin: 1.8 g/dL — ABNORMAL LOW (ref 3.5–5.0)
Anion gap: 12 (ref 5–15)
BUN: 7 mg/dL (ref 6–20)
CO2: 19 mmol/L — ABNORMAL LOW (ref 22–32)
Calcium: 4.8 mg/dL — CL (ref 8.9–10.3)
Chloride: 103 mmol/L (ref 98–111)
Creatinine, Ser: 1.31 mg/dL — ABNORMAL HIGH (ref 0.44–1.00)
GFR calc Af Amer: 56 mL/min — ABNORMAL LOW (ref >60)
GFR calc non Af Amer: 49 mL/min — ABNORMAL LOW (ref >60)
Glucose, Bld: 74 mg/dL (ref 70–99)
Phosphorus: 2.2 mg/dL — ABNORMAL LOW (ref 2.5–4.6)
Potassium: 3 mmol/L — ABNORMAL LOW (ref 3.5–5.1)
Sodium: 134 mmol/L — ABNORMAL LOW (ref 135–145)

## 2020-02-16 LAB — CK: Total CK: 994 U/L — ABNORMAL HIGH (ref 38–234)

## 2020-02-16 LAB — LIPASE, BLOOD: Lipase: 348 U/L — ABNORMAL HIGH (ref 11–51)

## 2020-02-16 LAB — HEPATIC FUNCTION PANEL
ALT: 154 U/L — ABNORMAL HIGH (ref 0–44)
AST: 90 U/L — ABNORMAL HIGH (ref 15–41)
Albumin: 1.8 g/dL — ABNORMAL LOW (ref 3.5–5.0)
Alkaline Phosphatase: 145 U/L — ABNORMAL HIGH (ref 38–126)
Bilirubin, Direct: 0.2 mg/dL (ref 0.0–0.2)
Indirect Bilirubin: 0.5 mg/dL (ref 0.3–0.9)
Total Bilirubin: 0.7 mg/dL (ref 0.3–1.2)
Total Protein: 6.3 g/dL — ABNORMAL LOW (ref 6.5–8.1)

## 2020-02-16 LAB — C-PEPTIDE: C-Peptide: 1.1 ng/mL (ref 1.1–4.4)

## 2020-02-16 LAB — INSULIN, RANDOM: Insulin: 5.5 u[IU]/mL (ref 2.6–24.9)

## 2020-02-16 LAB — PTH, INTACT AND CALCIUM
Calcium, Total (PTH): 4.3 mg/dL (ref 8.7–10.2)
PTH: 296 pg/mL — ABNORMAL HIGH (ref 15–65)

## 2020-02-16 LAB — CALCIUM, IONIZED: Calcium, Ionized, Serum: <3 mg/dL — ABNORMAL LOW (ref 4.5–5.6)

## 2020-02-16 LAB — MAGNESIUM: Magnesium: 1.4 mg/dL — ABNORMAL LOW (ref 1.7–2.4)

## 2020-02-16 LAB — PROTIME-INR
INR: 1.3 — ABNORMAL HIGH (ref 0.8–1.2)
Prothrombin Time: 15.6 seconds — ABNORMAL HIGH (ref 11.4–15.2)

## 2020-02-16 MED ORDER — VITAMIN D 25 MCG (1000 UNIT) PO TABS
2000.0000 [IU] | ORAL_TABLET | Freq: Every day | ORAL | Status: DC
  Administered 2020-02-16 – 2020-02-26 (×11): 2000 [IU] via ORAL
  Filled 2020-02-16 (×11): qty 2

## 2020-02-16 MED ORDER — SODIUM CHLORIDE 0.9 % IV SOLN
3.0000 g | Freq: Once | INTRAVENOUS | Status: AC
  Administered 2020-02-16: 3 g via INTRAVENOUS
  Filled 2020-02-16: qty 30

## 2020-02-16 MED ORDER — KCL IN DEXTROSE-NACL 20-5-0.9 MEQ/L-%-% IV SOLN
INTRAVENOUS | Status: DC
  Filled 2020-02-16 (×3): qty 1000

## 2020-02-16 MED ORDER — SACCHAROMYCES BOULARDII 250 MG PO CAPS
250.0000 mg | ORAL_CAPSULE | Freq: Two times a day (BID) | ORAL | Status: DC
  Administered 2020-02-16 – 2020-02-26 (×21): 250 mg via ORAL
  Filled 2020-02-16 (×21): qty 1

## 2020-02-16 MED ORDER — FERROUS SULFATE 325 (65 FE) MG PO TABS
325.0000 mg | ORAL_TABLET | Freq: Two times a day (BID) | ORAL | Status: DC
  Administered 2020-02-16 – 2020-02-26 (×20): 325 mg via ORAL
  Filled 2020-02-16 (×20): qty 1

## 2020-02-16 MED ORDER — POTASSIUM CHLORIDE CRYS ER 20 MEQ PO TBCR
40.0000 meq | EXTENDED_RELEASE_TABLET | ORAL | Status: AC
  Administered 2020-02-16 (×3): 40 meq via ORAL
  Filled 2020-02-16 (×3): qty 2

## 2020-02-16 MED ORDER — SODIUM CHLORIDE 0.9 % IV SOLN
510.0000 mg | Freq: Once | INTRAVENOUS | Status: AC
  Administered 2020-02-16: 510 mg via INTRAVENOUS
  Filled 2020-02-16: qty 17

## 2020-02-16 MED ORDER — MAGNESIUM SULFATE 4 GM/100ML IV SOLN
4.0000 g | Freq: Once | INTRAVENOUS | Status: AC
  Administered 2020-02-16: 4 g via INTRAVENOUS
  Filled 2020-02-16: qty 100

## 2020-02-16 MED ORDER — SODIUM CHLORIDE 0.9% FLUSH: 10.0000 mL | INTRAVENOUS | Status: DC | PRN

## 2020-02-16 MED ORDER — SIMETHICONE 80 MG PO CHEW
80.0000 mg | CHEWABLE_TABLET | Freq: Four times a day (QID) | ORAL | Status: DC | PRN
  Administered 2020-02-18 (×3): 80 mg via ORAL
  Filled 2020-02-16 (×3): qty 1

## 2020-02-16 NOTE — Progress Notes (Signed)
Physical Therapy Treatment Patient Details Name: Sarah Long MRN: [id number] DOB: 1971-09-23 Today's Date: 02/16/2020    History of Present Illness 49 y/o female w/ hx of obesity, HTN, HSV-2 infection, Herpes, dizzyness, DM, depression, bipolar 1 disorder, temp pacemaker (02/10/20), pacemake implant (02/13/20). Pt had presented to hospital with c/o 10/10 chest pain and elevated troponin, pt was dx with complete AV block, s/p new pacemaker (3/10)    PT Comments    Pt seen x 2 this day initially in am [id number]), pt able to ambulate in hall with SBA/min guard assist. Ambulated approx 244ft with no AD, pt needed multi rest breaks to complete distance and c/o feet feeling swollen. Sat pm in pm [id number]) and again pt was able to ambulate approx 237ft with no AD, this time needed min guard assist as noted to be waddling and needing increased standing rest breaks to complete distance. Pt again c/o feet swollen, feeling as if she had fluid on her, and also dizziness towards end of distance. Overall pt is making progress with independence and also activity tolerance but she still has far to go to return to PLOF.     Follow Up Recommendations  Supervision for mobility/OOB     Equipment Recommendations  None recommended by PT    Recommendations for Other Services OT consult     Precautions / Restrictions Precautions Precautions: Fall;ICD/Pacemaker Restrictions Weight Bearing Restrictions: Yes LUE Weight Bearing: Partial weight bearing LUE Partial Weight Bearing Percentage or Pounds: pacemaker precautions    Mobility  Bed Mobility               General bed mobility comments: pt found sitting edge of bed  Transfers Overall transfer level: Modified independent   Transfers: Sit to/from Stand;Stand Pivot Transfers Sit to Stand: Modified independent (Device/Increase time) Stand pivot transfers: Modified independent (Device/Increase time)           Ambulation/Gait Ambulation/Gait assistance: Min guard Gait Distance (Feet): 200 Feet Assistive device: None Gait Pattern/deviations: Wide base of support(flat footed, waddling gait) Gait velocity: dec   General Gait Details: multi standing rest breaks to complete distance, pt again c/o dizziness towards end   Stairs             Wheelchair Mobility    Modified Rankin (Stroke Patients Only)       Balance Overall balance assessment: Needs assistance Sitting-balance support: Feet supported Sitting balance-Leahy Scale: Good     Standing balance support: During functional activity Standing balance-Leahy Scale: Fair Standing balance comment: c/o dizziness w/ amb                            Cognition Arousal/Alertness: Awake/alert Behavior During Therapy: WFL for tasks assessed/performed Overall Cognitive Status: Within Functional Limits for tasks assessed                                        Exercises      General Comments        Pertinent Vitals/Pain Pain Assessment: No/denies pain    Home Living                      Prior Function            PT Goals (current goals can now be found in the care plan section) Acute Rehab PT Goals Patient Stated  Goal: get better PT Goal Formulation: With patient Time For Goal Achievement: 02/28/20 Potential to Achieve Goals: Good Progress towards PT goals: Progressing toward goals    Frequency    Min 3X/week      PT Plan Current plan remains appropriate    Co-evaluation              AM-PAC PT "6 Clicks" Mobility   Outcome Measure  Help needed turning from your back to your side while in a flat bed without using bedrails?: None Help needed moving from lying on your back to sitting on the side of a flat bed without using bedrails?: None Help needed moving to and from a bed to a chair (including a wheelchair)?: A Little Help needed standing up from a chair using  your arms (e.g., wheelchair or bedside chair)?: None Help needed to walk in hospital room?: A Little Help needed climbing 3-5 steps with a railing? : A Lot 6 Click Score: 20    End of Session   Activity Tolerance: Patient tolerated treatment well;Patient limited by fatigue Patient left: in bed;with call bell/phone within reach   PT Visit Diagnosis: Unsteadiness on feet (R26.81);Other abnormalities of gait and mobility (R26.89);Muscle weakness (generalized) (M62.81)     Time: [id number] PT Time Calculation (min) (ACUTE ONLY): 13 min  Charges:  $Gait Training: 8-22 mins                     Horald Chestnut, PT    Delford Field 02/16/2020, 3:36 PM

## 2020-02-16 NOTE — Progress Notes (Signed)
PROGRESS NOTE  Sarah Long [id number] DOB: 06-25-1971   PCP: [name], MD  Patient is from: home  DOA: 02/09/2020 LOS: 6  Brief Narrative / Interim history: 49 yr old obese F w/ significant PMHx alcohol abuse, tobacco abuse, IDDM, HTN, HLD and morbid obesity presented to [facility] with 10/10 chest pain. Per the triage note pt reported vomiting all day, being short of breath and having severe chest pain feeling like she was going to pass out. She denied loss of consciousness or syncopal episode.   Her initial Vitals were 133/79mmHg HR 41 RR 26 O2 sat 94% .  CBG>600mg /dl.  Bicarb obtained.  AG 30. Na 131.  K5.3. Cr 2.85.  Lipase 1879 AST 582.  ALT 294.  EKG showed an  Idioventricular rhythm with LAD, AVB, LVH and an inferior infarct (QRS 162 ms and QT/QTc 574/467 ms).  Troponin 730>> 2000.   Patient went into complete AVB with HR of 39 while in ED.  Started on external pacer.  Done she becomes somnolent after IV Versed requiring intubation.  Cardiology consulted and she was admitted to ICU.  She was also started on Unasyn for possible aspiration pneumonia.  Patient underwent LHC on 3/7 which was negative for CAD.  She had temporary venous pacemaker advanced at the same time.  Echo with EF of 40 to 45%, global hypokinesis, severe LVH and indeterminate diastolic function but no other significant finding.  She eventually had permanent PPM/ICD implant on 02/13/2020.  Patient was extubated on 02/11/2020, and has been vomiting.  Patient also with acute uncomplicated pancreatitis, rhabdomyolysis, significant hypocalcemia and vitamin D deficiency, transaminitis and iron deficiency anemia.   CT abdomen and pelvis on 3/7 and 3/12 showed acute uncomplicated pancreatitis with some inflammatory changes of the colon and small bowel but no abscess or pseudocyst.   Subjective: Seen and examined earlier this morning.  No major events overnight or this morning.  Complains of abdominal pain.  Had loose  stools yesterday but none so far.  Also reports some abdominal distention.  Denies chest pain or dyspnea.  Denies melena or hematochezia.  Some right arm swelling from IV infiltration.  Objective: Vitals:   02/15/20 1400 02/15/20 2130 02/15/20 2135 02/16/20 0500  BP: (!) 152/89  137/85 122/83  Pulse: 98 91  89  Resp: 20   16  Temp: 98.1 F (36.7 C) 97.8 F (36.6 C)  98.1 F (36.7 C)  TempSrc: Oral Oral  Oral  SpO2: 97% 99%    Weight:      Height:        Intake/Output Summary (Last 24 hours) at 02/16/2020 1511 Last data filed at 02/16/2020 0400 Gross per 24 hour  Intake 493 ml  Output 1425 ml  Net -932 ml   Filed Weights   02/12/20 0500 02/13/20 0500 02/15/20 0425  Weight: 106.5 kg 102 kg 106.6 kg    Examination:  GENERAL: No acute distress.  Appears well.  HEENT: MMM.  Vision and hearing grossly intact.  NECK: Supple.  No apparent JVD.  RESP: On RA.  No IWOB.  Fair aeration bilaterally but poor inspiratory effort CVS:  RRR. Heart sounds normal.  ABD/GI/GU: Bowel sounds present. Soft.  Mild diffuse tenderness MSK/EXT:  Moves extremities. No apparent deformity.  Trace edema in all extremities SKIN: Dressing over left upper chest anteriorly DCI NEURO: Awake, alert and oriented appropriately.  No apparent focal neuro deficit. PSYCH: Calm. Normal affect.  Procedures:  ETT 3/7-3/8 LHC/temporary internal venous pacer on  3/7 PPM/ICD placement on 3/10 2D echo on 3/7-LVEF 40 to 45%, global hypokinesis.  Severe LVH  Assessment & Plan: Complete Heart Block/bifascicular block (RBBB and LAFB)  -s/p PPM and ICD on 02/13/2020.  Device interrogation normal. -Cardiology following.  Non-STEMI: presented with severe chest pain. Troponin elevated to 730> 2000. EKG showed an  Idioventricular rhythm with LAD, AVB, LVH and an inferior infarct (QRS 162 ms and QT/QTc 574/467 ms). LHC on 3/5 without CAD.  Now chest pain-free.  Intermittent shortness of breath. -LHC on 3/7 without CAD.    -Optimize risk reduction  New systolic CHF: Echo on 3/7 with LVEF of 40 to 45%, global hypokinesis, severe LVH.  Repeat CXR on 3/11 with improved aeration with decreasing vascular congestion and bibasilar atelectasis.  On room air.  Trace edema on exam.  Reports intermittent shortness of breath which is likely from atelectasis -PPM/ICD as above -Continue IV fluids for rhabdomyolysis -Monitor fluid status, renal function and electrolytes.  Acute hypoxic respiratory failure: Was intubated due to somnolence after IV Versed on presentation.  Also new CHF and possible aspiration pneumonia. Now on room air but feels short of breath intermittently likely due to atelectasis -Unasyn 3/8-3/11.  Augmentin 3/11-3/13 -Encouraged incentive spirometry  Uncontrolled DM-2 with hypoglycemia: Presented with DKA that has resolved.  A1c 12.7%.  Now with refractory hypoglycemia.  C-peptide and insulin level within normal range. Recent Labs    02/15/20 2223 02/16/20 0803 02/16/20 1130  GLUCAP 127* 51* 101*  -Reduced SSI to very thin -D5-NS-KCl at 75 cc an hour in the setting of rhabdo and hypoglycemia -RD and diabetic coordinator following.  Acute alcoholic pancreatitis: CT abdomen and pelvis on 3/7 with acute uncomplicated pancreatitis, fatty liver and gallbladder sludge.  Repeat CT on 3/12 with acute pancreatitis and associated inflammatory changes of colon and small bowel.  Lipase 1900> 941> 348.  AST/ALT 582/294 but improving.  ALP and total bili within normal.  RUQ [place] with steatosis.  Triglyceride normal.  Calcium is very low.  Continues to have abdominal pain intermittently.  -Trend lipase and LFT -Replenish calcium -Hold statin.  Acute kidney injury: Cr 2.85 (admit) >>> 1.21> 1.25> 1.31.  Multifactorial including cardiorenal, rhabdo and iatrogenic from ARB/HCTZ and NSAIDs.  Improving. -IV fluid as above. -Avoid nephrotoxic meds -Continue monitoring  Hypocalcemia/vitamin D deficiency: Calcium 4.8.   Multifactorial including vitamin D deficiency (< 4.2), rhabdo and acute pancreatitis.  PTH is appropriately high.  Urine calcium appropriately low at 4.3. -Calcium gluconate 3 g -P.o. vitamin D 50,000 IU weekly and 2000 daily-suspect poor observation in the setting of acute pancreatitis -May consider adding low-dose HCTZ  Rhabdomyolysis: Due to alcohol?  Statin?  CK 855> 954> 994.  CK-MB low. -IV fluid as above-increased rate. -Hold statin  Hyponatremia in the setting of AKI: -IV fluid as above.  Hypocalcemia/hypokalemia/hypomagnesemia/hypophosphatemia -Replenish and recheck. -Optimize nutrition  Lactic acidosis: LA > 11 on admit>>> 2.1.  Iron deficiency anemia Hgb 10.5 (admit)>> 10.2> 9.5>8.8.  Iron sat 4%. -IV Feraheme -Start p.o. ferrous sulfate  Alcohol use disorder: Reportedly drinks about 5-6 drinks including liquors -Counseled. -Consult TOC for resources  Tobacco use disorder: Smokes about a pack a day. -Cessation counseling -Nicotine patch  Morbid obesity: Body mass index is 40.34 kg/m. -Encourage lifestyle change to lose weight -Consult dietitian  Nutrition:  Nutrition Problem: Inadequate oral intake Etiology: poor appetite  Signs/Symptoms: per patient/family report  Interventions: Ensure Enlive (each supplement provides 350kcal and 20 grams of protein), Liberalize Diet   DVT prophylaxis:  Subcu heparin Code Status: Full code Family Communication: Patient and/or RN. Available if any question.   Discharge barrier: Acute pancreatitis, AKI, electrolyte derangements, rhabdomyolysis.  Very high risk for deconditioning.  Requires close monitoring for replenishment of electrolytes.  Patient is from: Home Final disposition: Likely home when medically stable  Consultants: PCCM (off), cardiology, EP,   Microbiology summarized: Influenza PCR negative COVID-19 negative MRSA PCR negative Blood cultures negative  Sch Meds:  Scheduled Meds: .  amoxicillin-clavulanate  1 tablet Oral Q12H  . aspirin  81 mg Oral Daily  . Chlorhexidine Gluconate Cloth  6 each Topical Daily  . feeding supplement (ENSURE ENLIVE)  237 mL Oral TID BM  . folic acid  1 mg Oral Daily  . heparin  5,000 Units Subcutaneous Q8H  . insulin aspart  0-5 Units Subcutaneous QHS  . insulin aspart  0-6 Units Subcutaneous TID WC  . nicotine  21 mg Transdermal Daily  . pantoprazole  40 mg Oral Daily  . potassium chloride  40 mEq Oral Q4H  . sodium chloride flush  10-40 mL Intracatheter Q12H  . sodium chloride flush  3 mL Intravenous Q12H  . Vitamin D (Ergocalciferol)  50,000 Units Oral Q7 days   Continuous Infusions: . sodium chloride Stopped (02/13/20 0820)  . sodium chloride    . sodium chloride 250 mL (02/14/20 0043)  . sodium chloride 100 mL/hr at 02/16/20 1421  . calcium gluconate 3 g (02/16/20 1421)  . dextrose 50 mL/hr at 02/15/20 2136   PRN Meds:.sodium chloride, Place/Maintain arterial line **AND** sodium chloride, sodium chloride, acetaminophen, dextrose, fentaNYL (SUBLIMAZE) injection, Melatonin, ondansetron (ZOFRAN) IV, sodium chloride flush, sodium chloride flush, sodium chloride flush  Antimicrobials: Anti-infectives (From admission, onward)   Start     Dose/Rate Route Frequency Ordered Stop   02/14/20 1345  amoxicillin-clavulanate (AUGMENTIN) 875-125 MG per tablet 1 tablet     1 tablet Oral Every 12 hours 02/14/20 1337 02/18/20 0959   02/13/20 2000  ceFAZolin (ANCEF) IVPB 1 g/50 mL premix     1 g 100 mL/hr over 30 Minutes Intravenous Every 6 hours 02/13/20 1740 02/14/20 0927   02/13/20 1702  gentamicin (GARAMYCIN) 80 mg in sodium chloride 0.9 % 500 mL irrigation  Status:  Discontinued       As needed 02/13/20 1703 02/13/20 1719   02/13/20 0045  gentamicin (GARAMYCIN) 80 mg in sodium chloride 0.9 % 500 mL irrigation  Status:  Discontinued     80 mg Irrigation On call 02/13/20 0042 02/13/20 1734   02/13/20 0045  ceFAZolin (ANCEF) IVPB 2g/100 mL  premix     2 g 200 mL/hr over 30 Minutes Intravenous On call 02/13/20 0042 02/13/20 1615   02/11/20 1130  Ampicillin-Sulbactam (UNASYN) 3 g in sodium chloride 0.9 % 100 mL IVPB  Status:  Discontinued     3 g 200 mL/hr over 30 Minutes Intravenous Every 6 hours 02/11/20 1115 02/14/20 1337       I have personally reviewed the following labs and images: CBC: Recent Labs  Lab 02/10/20 0344 02/10/20 0553 02/11/20 0436 02/13/20 0437 02/13/20 0551 02/15/20 0728 02/16/20 0247  WBC 14.2*  --  13.4* 10.3  --  10.3 11.4*  NEUTROABS  --   --  10.0*  --   --   --   --   HGB 11.7*   < > 11.4* 9.4* 10.2* 9.5* 8.8*  HCT 38.2   < > 33.9* 29.4* 30.0* 29.4* 27.3*  MCV 97.7  --  89.7  93.3  --  93.6 92.9  PLT 207  --  204 159  --  153 188   < > = values in this interval not displayed.   BMP &GFR Recent Labs  Lab 02/10/20 1805 02/10/20 2153 02/11/20 0436 02/11/20 0816 02/12/20 [id number] 02/12/20 [id number] 02/13/20 0437 02/13/20 0551 02/13/20 0559 02/14/20 0416 02/15/20 0728 02/15/20 1143 02/16/20 0247  NA   < >  --  133*   < > 137   < > 140 140  --  139 135  --  134*  K   < >  --  4.0   < > 3.5   < > 3.7 3.6  --  3.4* 3.8  --  3.0*  CL   < >  --  103   < > 108   < > 108 108  --  109 107  --  103  CO2   < >  --  17*   < > 20*  --  20*  --   --  20* 18*  --  19*  GLUCOSE   < >  --  144*   < > 148*   < > 208* 187*  --  81 91  --  74  BUN   < >  --  27*   < > 23*   < > 19 17  --  12 8  --  7  CREATININE   < >  --  1.91*   < > 1.40*   < > 1.27* 1.10*  --  1.21* 1.25*  --  1.31*  CALCIUM   < >  --  6.6*   < > 5.9*   < > 5.3*  --   --  4.9* 4.4* 4.3 4.8*  MG   < > 1.2* 1.8  --  1.7  --   --   --  2.2  --   --  1.5* 1.4*  PHOS  --  2.7 3.1  --   --   --   --   --   --   --  1.4*  --  2.2*   < > = values in this interval not displayed.   Estimated Creatinine Clearance: 64 mL/min (A) (by C-G formula based on SCr of 1.31 mg/dL (H)). Liver & Pancreas: Recent Labs  Lab 02/10/20 0700 02/10/20 0700  02/10/20 2019 02/11/20 0436 02/14/20 1350 02/15/20 0728 02/16/20 0247  AST 736*  --  582*  --  243* 143* 90*  ALT 275*  --  294*  --  337* 215* 154*  ALKPHOS 68  --  58  --  103 107 145*  BILITOT 1.0  --  0.7  --  0.6 0.7 0.7  PROT 5.5*  --  5.5*  --  5.8* 5.9* 6.3*  ALBUMIN 2.6*   < > 2.5* 2.3* 1.9* 1.9*  1.9* 1.8*  1.8*   < > = values in this interval not displayed.   Recent Labs  Lab 02/10/20 2153 02/14/20 0416 02/15/20 1143 02/16/20 0247  LIPASE 1,879* 941* 349* 348*   No results for input(s): AMMONIA in the last 168 hours. Diabetic: No results for input(s): HGBA1C in the last 72 hours. Recent Labs  Lab 02/15/20 1701 02/15/20 2126 02/15/20 2223 02/16/20 0803 02/16/20 1130  GLUCAP 80 54* 127* 51* 101*   Cardiac Enzymes: Recent Labs  Lab 02/14/20 1350 02/15/20 0728 02/15/20 1442 02/16/20 0247  CKTOTAL 855* 954* 931* 994*  CKMB  --   --  4.9  --    No results for input(s): PROBNP in the last 8760 hours. Coagulation Profile: Recent Labs  Lab 02/15/20 1143 02/16/20 0247  INR 1.3* 1.3*   Thyroid Function Tests: No results for input(s): TSH, T4TOTAL, FREET4, T3FREE, THYROIDAB in the last 72 hours. Lipid Profile: No results for input(s): CHOL, HDL, LDLCALC, TRIG, CHOLHDL, LDLDIRECT in the last 72 hours. Anemia Panel: Recent Labs    02/15/20 1601  VITAMINB12 1,489*  FOLATE 11.4  FERRITIN 531*  TIBC 253  IRON 11*  RETICCTPCT 1.5   Urine analysis:    Component Value Date/Time   COLORURINE YELLOW 02/10/2020 0617   APPEARANCEUR CLOUDY (A) 02/10/2020 0617   LABSPEC 1.015 02/10/2020 0617   PHURINE 5.0 02/10/2020 0617   GLUCOSEU >=500 (A) 02/10/2020 0617   HGBUR MODERATE (A) 02/10/2020 0617   BILIRUBINUR NEGATIVE 02/10/2020 0617   BILIRUBINUR negative 07/10/2019 1445   BILIRUBINUR neg 09/28/2017 0907   KETONESUR NEGATIVE 02/10/2020 0617   PROTEINUR 100 (A) 02/10/2020 0617   UROBILINOGEN 0.2 07/10/2019 1445   UROBILINOGEN 0.2 07/16/2015 2024    NITRITE NEGATIVE 02/10/2020 0617   LEUKOCYTESUR NEGATIVE 02/10/2020 0617   Sepsis Labs: Invalid input(s): PROCALCITONIN, [place]  Microbiology: Recent Results (from the past 240 hour(s))  Respiratory Panel by RT PCR (Flu A&B, Covid) - Nasopharyngeal Swab     Status: None   Collection Time: 02/10/20 12:30 AM   Specimen: Nasopharyngeal Swab  Result Value Ref Range Status   SARS Coronavirus 2 by RT PCR NEGATIVE NEGATIVE Final    Comment: (NOTE) SARS-CoV-2 target nucleic acids are NOT DETECTED. The SARS-CoV-2 RNA is generally detectable in upper respiratoy specimens during the acute phase of infection. The lowest concentration of SARS-CoV-2 viral copies this assay can detect is 131 copies/mL. A negative result does not preclude SARS-Cov-2 infection and should not be used as the sole basis for treatment or other patient management decisions. A negative result may occur with  improper specimen collection/handling, submission of specimen other than nasopharyngeal swab, presence of viral mutation(s) within the areas targeted by this assay, and inadequate number of viral copies (<131 copies/mL). A negative result must be combined with clinical observations, patient history, and epidemiological information. The expected result is Negative. Fact Sheet for Patients:  PinkCheek.be Fact Sheet for Healthcare Providers:  GravelBags.it This test is not yet ap proved or cleared by the [place] FDA and  has been authorized for detection and/or diagnosis of SARS-CoV-2 by FDA under an Emergency Use Authorization (EUA). This EUA will remain  in effect (meaning this test can be used) for the duration of the COVID-19 declaration under Section 564(b)(1) of the Act, 21 U.S.C. section 360bbb-3(b)(1), unless the authorization is terminated or revoked sooner.    Influenza A by PCR NEGATIVE NEGATIVE Final   Influenza B by PCR NEGATIVE  NEGATIVE Final    Comment: (NOTE) The Xpert Xpress SARS-CoV-2/FLU/RSV assay is intended as an aid in  the diagnosis of influenza from Nasopharyngeal swab specimens and  should not be used as a sole basis for treatment. Nasal washings and  aspirates are unacceptable for Xpert Xpress SARS-CoV-2/FLU/RSV  testing. Fact Sheet for Patients: PinkCheek.be Fact Sheet for Healthcare Providers: GravelBags.it This test is not yet approved or cleared by the [place] FDA and  has been authorized for detection and/or diagnosis of SARS-CoV-2 by  FDA under an Emergency Use Authorization (EUA). This EUA will remain  in effect (meaning this test can be used) for the duration of the  Covid-19 declaration under Section 564(b)(1) of the Act, 21  U.S.C. section 360bbb-3(b)(1), unless the authorization is  terminated or revoked. Performed at [facility], [place] [address], [place], [place] [address]   MRSA PCR Screening     Status: None   Collection Time: 02/10/20  3:40 AM   Specimen: Nasal Mucosa; Nasopharyngeal  Result Value Ref Range Status   MRSA by PCR NEGATIVE NEGATIVE Final    Comment:        The GeneXpert MRSA Assay (FDA approved for NASAL specimens only), is one component of a comprehensive MRSA colonization surveillance program. It is not intended to diagnose MRSA infection nor to guide or monitor treatment for MRSA infections. Performed at [facility], [place] [address] [place], [place] [address]   Culture, blood (routine x 2)     Status: None   Collection Time: 02/10/20  9:26 PM   Specimen: BLOOD RIGHT HAND  Result Value Ref Range Status   Specimen Description BLOOD RIGHT HAND  Final   Special Requests   Final    BOTTLES DRAWN AEROBIC ONLY Blood Culture results may not be optimal due to an inadequate volume of blood received in culture bottles   Culture   Final    NO GROWTH 5 DAYS Performed at [facility], [place] [address], [place], [place] [address]    Report Status 02/15/2020 FINAL  Final  Culture, blood (routine x 2)     Status: None   Collection Time: 02/10/20  9:27 PM   Specimen: BLOOD  Result Value Ref Range Status   Specimen Description BLOOD RIGHT ANTECUBITAL  Final   Special Requests   Final    BOTTLES DRAWN AEROBIC ONLY Blood Culture adequate volume   Culture   Final    NO GROWTH 5 DAYS Performed at [facility], [place] [address], [place], [place] [address]    Report Status 02/15/2020 FINAL  Final  Surgical PCR screen     Status: None   Collection Time: 02/13/20  1:11 AM   Specimen: Nasal Mucosa; Nasal Swab  Result Value Ref Range Status   MRSA, PCR NEGATIVE NEGATIVE Final   Staphylococcus aureus NEGATIVE NEGATIVE Final    Comment: (NOTE) The Xpert SA Assay (FDA approved for NASAL specimens in patients 30 years of age and older), is one component of a comprehensive surveillance program. It is not intended to diagnose infection nor to guide or monitor treatment. Performed at [facility], [place] [address], [place], [place] [address]     Radiology Studies: CT ABDOMEN PELVIS W CONTRAST  Result Date: 02/15/2020 CLINICAL DATA:  49 year old female with abdominal pain, nausea vomiting. Pancreatitis. EXAM: CT ABDOMEN AND PELVIS WITH CONTRAST TECHNIQUE: Multidetector CT imaging of the abdomen and pelvis was performed using the standard protocol following bolus administration of intravenous contrast. CONTRAST:  176mL OMNIPAQUE IOHEXOL 300 MG/ML  SOLN COMPARISON:  CT abdomen pelvis dated 02/10/2020. FINDINGS: Lower chest: Partially visualized small bilateral pleural effusions. Bibasilar streaky densities may represent atelectasis or infiltrate. Clinical correlation is recommended. Top-normal cardiac size. Partially visualized pacemaker wires. No intra-abdominal free air. Small ascites. Hepatobiliary: Background of fatty infiltration of the liver. No intrahepatic biliary  duct dilatation. The gallbladder is unremarkable. Pancreas: Diffuse inflammatory changes of the upper abdomen and surrounding the pancreas in keeping with pancreatitis. No drainable fluid collection, abscess, or pseudocyst. Spleen: Normal in size without focal abnormality. Adrenals/Urinary Tract: The adrenal glands are unremarkable. There is no hydronephrosis  on either side. There is symmetric enhancement and excretion of contrast by both kidneys. The urinary bladder is unremarkable. Stomach/Bowel: There is diffuse inflammatory changes and thickening of the colon and multiple loops of small bowel which may be reactive to inflammatory changes of the pancreas or represent enterocolitis. Clinical correlation is recommended. There is no bowel obstruction. The appendix is normal. Vascular/Lymphatic: Mild aortoiliac atherosclerotic disease. The IVC is unremarkable. The SMV, splenic vein, and main portal vein are patent. No portal venous gas. There is no adenopathy. Reproductive: The uterus is anteverted. Posterior body fibroid noted. No adnexal masses. Other: There is diffuse subcutaneous edema and anasarca, new or worsened since the prior CT. Midline vertical anterior pelvic wall incisional scar. No drainable fluid collection. Musculoskeletal: No acute or significant osseous findings. IMPRESSION: 1. Acute pancreatitis. No drainable fluid collection, abscess, or pseudocyst. 2. Inflammatory changes of the colon and small bowel may be reactive to inflammatory changes of the pancreas or represent enterocolitis. Clinical correlation is recommended. No bowel obstruction. Normal appendix. 3. Small bilateral pleural effusions and bibasilar atelectasis/infiltrates. 4. Aortic Atherosclerosis (ICD10-I70.0). Electronically Signed   By: Anner Crete M.D.   On: 02/15/2020 21:21     Tresa Jolley T. [place]  If 7PM-7AM, please contact night-coverage [email or web address] Password [facility] 02/16/2020, 3:11 PM

## 2020-02-17 LAB — CBC
HCT: 29.6 % — ABNORMAL LOW (ref 36.0–46.0)
Hemoglobin: 9.5 g/dL — ABNORMAL LOW (ref 12.0–15.0)
MCH: 29.9 pg (ref 26.0–34.0)
MCHC: 32.1 g/dL (ref 30.0–36.0)
MCV: 93.1 fL (ref 80.0–100.0)
Platelets: 181 10*3/uL (ref 150–400)
RBC: 3.18 MIL/uL — ABNORMAL LOW (ref 3.87–5.11)
RDW: 14.9 % (ref 11.5–15.5)
WBC: 14.2 10*3/uL — ABNORMAL HIGH (ref 4.0–10.5)
nRBC: 0 % (ref 0.0–0.2)

## 2020-02-17 LAB — RENAL FUNCTION PANEL
Albumin: 1.7 g/dL — ABNORMAL LOW (ref 3.5–5.0)
Anion gap: 11 (ref 5–15)
BUN: 8 mg/dL (ref 6–20)
CO2: 19 mmol/L — ABNORMAL LOW (ref 22–32)
Calcium: 5 mg/dL — CL (ref 8.9–10.3)
Chloride: 106 mmol/L (ref 98–111)
Creatinine, Ser: 1 mg/dL (ref 0.44–1.00)
GFR calc Af Amer: >60 mL/min (ref >60)
GFR calc non Af Amer: >60 mL/min (ref >60)
Glucose, Bld: 154 mg/dL — ABNORMAL HIGH (ref 70–99)
Phosphorus: 1.3 mg/dL — ABNORMAL LOW (ref 2.5–4.6)
Potassium: 4.1 mmol/L (ref 3.5–5.1)
Sodium: 136 mmol/L (ref 135–145)

## 2020-02-17 LAB — HEPATIC FUNCTION PANEL
ALT: 100 U/L — ABNORMAL HIGH (ref 0–44)
AST: 50 U/L — ABNORMAL HIGH (ref 15–41)
Albumin: 1.7 g/dL — ABNORMAL LOW (ref 3.5–5.0)
Alkaline Phosphatase: 124 U/L (ref 38–126)
Bilirubin, Direct: 0.2 mg/dL (ref 0.0–0.2)
Indirect Bilirubin: 0.5 mg/dL (ref 0.3–0.9)
Total Bilirubin: 0.7 mg/dL (ref 0.3–1.2)
Total Protein: 6.2 g/dL — ABNORMAL LOW (ref 6.5–8.1)

## 2020-02-17 LAB — CALCIUM / CREATININE RATIO, URINE
Calcium, Ur: <0.8 mg/dL
Calcium/Creat.Ratio: <19 mg/g creat — ABNORMAL LOW (ref 29–442)
Creatinine, Urine: 42.1 mg/dL

## 2020-02-17 LAB — CK: Total CK: 843 U/L — ABNORMAL HIGH (ref 38–234)

## 2020-02-17 LAB — LIPASE, BLOOD: Lipase: 297 U/L — ABNORMAL HIGH (ref 11–51)

## 2020-02-17 LAB — MAGNESIUM: Magnesium: 1.8 mg/dL (ref 1.7–2.4)

## 2020-02-17 LAB — GLUCOSE, CAPILLARY
Glucose-Capillary: 141 mg/dL — ABNORMAL HIGH (ref 70–99)
Glucose-Capillary: 202 mg/dL — ABNORMAL HIGH (ref 70–99)
Glucose-Capillary: 275 mg/dL — ABNORMAL HIGH (ref 70–99)
Glucose-Capillary: 312 mg/dL — ABNORMAL HIGH (ref 70–99)

## 2020-02-17 MED ORDER — SODIUM CHLORIDE 0.9 % IV SOLN
3.0000 g | Freq: Once | INTRAVENOUS | Status: AC
  Administered 2020-02-17: 3 g via INTRAVENOUS
  Filled 2020-02-17: qty 30

## 2020-02-17 NOTE — Progress Notes (Signed)
PROGRESS NOTE  Sarah Long [id number] DOB: [birth year]   PCP: [name], MD  Patient is from: home  DOA: 02/09/2020 LOS: 7  Brief Narrative / Interim history: 49 yr old obese F w/ significant PMHx alcohol abuse, tobacco abuse, IDDM, HTN, HLD and morbid obesity presented to [facility] with 10/10 chest pain. Per the triage note pt reported vomiting all day, being short of breath and having severe chest pain feeling like she was going to pass out. She denied loss of consciousness or syncopal episode.   Her initial Vitals were 133/45mmHg HR 41 RR 26 O2 sat 94% .  CBG>600mg /dl.  Bicarb obtained.  AG 30. Na 131.  K5.3. Cr 2.85.  Lipase 1879 AST 582.  ALT 294.  EKG showed an  Idioventricular rhythm with LAD, AVB, LVH and an inferior infarct (QRS 162 ms and QT/QTc 574/467 ms).  Troponin 730>> 2000.   Patient went into complete AVB with HR of 39 while in ED.  Started on external pacer.  Done she becomes somnolent after IV Versed requiring intubation.  Cardiology consulted and she was admitted to ICU.  She was also started on Unasyn for possible aspiration pneumonia.  Patient underwent LHC on 3/7 which was negative for CAD.  She had temporary venous pacemaker advanced at the same time.  Echo with EF of 40 to 45%, global hypokinesis, severe LVH and indeterminate diastolic function but no other significant finding.  She eventually had permanent PPM/ICD implant on 02/13/2020.  Patient was extubated on 02/11/2020, and has been vomiting.  Patient also with acute uncomplicated pancreatitis, rhabdomyolysis, significant hypocalcemia and vitamin D deficiency, transaminitis and iron deficiency anemia.   CT abdomen and pelvis on 3/7 and 3/12 showed acute uncomplicated pancreatitis with some inflammatory changes of the colon and small bowel but no abscess or pseudocyst.   02/17/2020: Patient seen alongside patient's nurse.  No new complaints.  Calcium remains low (5) with albumin of 1.7, likely complication of  acute pancreatitis.  Lipase is 297, and CPK is 843.  Serum creatinine is 1.  Potassium is 4.1.  Subjective: No new complaints No abdominal pain No shortness of breath  Objective: Vitals:   02/17/20 0331 02/17/20 0800 02/17/20 0909 02/17/20 1210  BP: 117/73 97/82 121/84 121/74  Pulse: 93 94 94 92  Resp: 16     Temp: 99.4 F (37.4 C)   98.5 F (36.9 C)  TempSrc: Oral   Oral  SpO2: 100%   100%  Weight: 105 kg     Height:        Intake/Output Summary (Last 24 hours) at 02/17/2020 1315 Last data filed at 02/17/2020 0900 Gross per 24 hour  Intake 1307.17 ml  Output --  Net 1307.17 ml   Filed Weights   02/13/20 0500 02/15/20 0425 02/17/20 0331  Weight: 102 kg 106.6 kg 105 kg    Examination:  GENERAL: No acute distress.  Patient seems volume overloaded.  We will hold IV fluid.  HEENT: Pallor. No jaundice.  NECK: Supple.  No apparent JVD.  RESP: Clear to auscultation CVS:  RRR. Heart sounds normal.  ABD/GI/GU: Obese, soft and nontender.  Organs are difficult to assess EXT: Bilateral leg edema, seems chronic. NEURO: Awake and alert.  Patient moves all extremities.  Procedures:  ETT 3/7-3/8 LHC/temporary internal venous pacer on 3/7 PPM/ICD placement on 3/10 2D echo on 3/7-LVEF 40 to 45%, global hypokinesis.  Severe LVH  Assessment & Plan: Complete Heart Block/bifascicular block (RBBB and LAFB)  -s/p PPM  and ICD on 02/13/2020.  Device interrogation normal. -Cardiology following.  Non-STEMI: presented with severe chest pain. Troponin elevated to 730> 2000. EKG showed an  Idioventricular rhythm with LAD, AVB, LVH and an inferior infarct (QRS 162 ms and QT/QTc 574/467 ms). LHC on 3/5 without CAD.  Now chest pain-free.  Intermittent shortness of breath. -LHC on 3/7 without CAD.  -Continue to monitor.  New systolic CHF: Echo on 3/7 with LVEF of 40 to 45%, global hypokinesis, severe LVH.  Repeat CXR on 3/11 with improved aeration with decreasing vascular congestion and  bibasilar atelectasis.  On room air.  Trace edema on exam.  Reports intermittent shortness of breath which is likely from atelectasis -PPM/ICD as above -Continue IV fluids for rhabdomyolysis -Monitor fluid status, renal function and electrolytes. 02/17/2020: Hold IV fluids.  Continue to monitor renal function and CPK.  Acute hypoxic respiratory failure: Was intubated due to somnolence after IV Versed on presentation.  Also new CHF and possible aspiration pneumonia. Now on room air but feels short of breath intermittently likely due to atelectasis -Unasyn 3/8-3/11.  Augmentin 3/11-3/13 -Encouraged incentive spirometry 01/20/2020: Stable.  Continue to monitor closely.  Uncontrolled DM-2 with hypoglycemia: Presented with DKA that has resolved.  A1c 12.7%.  Now with refractory hypoglycemia.  C-peptide and insulin level within normal range. Recent Labs    02/16/20 2106 02/17/20 0747 02/17/20 1154  GLUCAP 201* 141* 202*  -Reduced SSI to very thin -D5-NS-KCl at 75 cc an hour in the setting of rhabdo and hypoglycemia -RD and diabetic coordinator following. 02/17/2020: Control is improving.  Acute alcoholic pancreatitis: CT abdomen and pelvis on 3/7 with acute uncomplicated pancreatitis, fatty liver and gallbladder sludge.  Repeat CT on 3/12 with acute pancreatitis and associated inflammatory changes of colon and small bowel.  Lipase 1900> 941> 348.  AST/ALT 582/294 but improving.  ALP and total bili within normal.  RUQ [place] with steatosis.  Triglyceride normal.  Calcium is very low.  Continues to have abdominal pain intermittently.  -Trend lipase and LFT -Replenish calcium -Hold statin. 38 2021: Counseled to quit alcohol use.  Acute kidney injury: Cr 2.85 (admit) >>> 1.21> 1.25> 1.31.  Multifactorial including cardiorenal, rhabdo and iatrogenic from ARB/HCTZ and NSAIDs.  Improving. -IV fluid as above. -Avoid nephrotoxic meds -Continue monitoring 02/17/2020: Resolved.  Serum creatinine today is  1.  Leukocytosis:  -WBC is 14.2 today (up from 11.4) -Continue to monitor closely. -Repeat CBC tomorrow -Low threshold to repeat CT abdomen and pelvis.  Hypocalcemia/vitamin D deficiency: Calcium 4.8.  Multifactorial including vitamin D deficiency (< 4.2), rhabdo and acute pancreatitis.  PTH is appropriately high.  Urine calcium appropriately low at 4.3. -Calcium gluconate 3 g -P.o. vitamin D 50,000 IU weekly and 2000 daily-suspect poor observation in the setting of acute pancreatitis  Rhabdomyolysis: Due to alcohol?  Statin?  CK 855> 954> 994.  CK-MB low. -IV fluid as above-increased rate. -Hold statin 02/17/2020: Hold IV fluids.  Hyponatremia in the setting of AKI: -IV fluid as above. 02/17/2020: Resolved.  Sodium today is 136  Hypocalcemia/hypokalemia/hypomagnesemia/hypophosphatemia -Continue to monitor and replete accordingly.  Lactic acidosis: LA > 11 on admit>>> 2.1.  Anemia: -Likely anemia of chronic inflammation.   -Hgb 10.5 (admit)>> 10.2> 9.5>8.8.    Alcohol use disorder:  Reportedly drinks about 5-6 drinks including liquors -Counseled. -Consult TOC for resources  Tobacco use disorder: Smokes about a pack a day. -Cessation counseling -Nicotine patch  Morbid obesity: Body mass index is 39.72 kg/m. -Encourage lifestyle change to lose weight -Consult  dietitian  Nutrition:  Nutrition Problem: Inadequate oral intake Etiology: poor appetite  Signs/Symptoms: per patient/family report  Interventions: Ensure Enlive (each supplement provides 350kcal and 20 grams of protein), Liberalize Diet   DVT prophylaxis: Subcu heparin Code Status: Full code Family Communication: Patient and/or RN. Available if any question.   Discharge barrier: Acute pancreatitis, AKI, electrolyte derangements, rhabdomyolysis.  Very high risk for deconditioning.  Requires close monitoring for replenishment of electrolytes.  Patient is from: Home Final disposition: Likely home when  medically stable  Consultants: PCCM (off), cardiology, EP,   Microbiology summarized: Influenza PCR negative COVID-19 negative MRSA PCR negative Blood cultures negative  Sch Meds:  Scheduled Meds:  aspirin  81 mg Oral Daily   Chlorhexidine Gluconate Cloth  6 each Topical Daily   cholecalciferol  2,000 Units Oral Daily   feeding supplement (ENSURE ENLIVE)  237 mL Oral TID BM   ferrous sulfate  325 mg Oral BID WC   folic acid  1 mg Oral Daily   heparin  5,000 Units Subcutaneous Q8H   insulin aspart  0-5 Units Subcutaneous QHS   insulin aspart  0-6 Units Subcutaneous TID WC   nicotine  21 mg Transdermal Daily   pantoprazole  40 mg Oral Daily   saccharomyces boulardii  250 mg Oral BID   sodium chloride flush  10-40 mL Intracatheter Q12H   sodium chloride flush  3 mL Intravenous Q12H   Vitamin D (Ergocalciferol)  50,000 Units Oral Q7 days   Continuous Infusions:  sodium chloride Stopped (02/13/20 0820)   sodium chloride     sodium chloride 250 mL (02/14/20 0043)   PRN Meds:.sodium chloride, Place/Maintain arterial line **AND** sodium chloride, sodium chloride, acetaminophen, dextrose, fentaNYL (SUBLIMAZE) injection, Melatonin, ondansetron (ZOFRAN) IV, simethicone, sodium chloride flush, sodium chloride flush, sodium chloride flush  Antimicrobials: Anti-infectives (From admission, onward)   Start     Dose/Rate Route Frequency Ordered Stop   02/14/20 1345  amoxicillin-clavulanate (AUGMENTIN) 875-125 MG per tablet 1 tablet  Status:  Discontinued     1 tablet Oral Every 12 hours 02/14/20 1337 02/16/20 1533   02/13/20 2000  ceFAZolin (ANCEF) IVPB 1 g/50 mL premix     1 g 100 mL/hr over 30 Minutes Intravenous Every 6 hours 02/13/20 1740 02/14/20 0927   02/13/20 1702  gentamicin (GARAMYCIN) 80 mg in sodium chloride 0.9 % 500 mL irrigation  Status:  Discontinued       As needed 02/13/20 1703 02/13/20 1719   02/13/20 0045  gentamicin (GARAMYCIN) 80 mg in sodium  chloride 0.9 % 500 mL irrigation  Status:  Discontinued     80 mg Irrigation On call 02/13/20 0042 02/13/20 1734   02/13/20 0045  ceFAZolin (ANCEF) IVPB 2g/100 mL premix     2 g 200 mL/hr over 30 Minutes Intravenous On call 02/13/20 0042 02/13/20 1615   02/11/20 1130  Ampicillin-Sulbactam (UNASYN) 3 g in sodium chloride 0.9 % 100 mL IVPB  Status:  Discontinued     3 g 200 mL/hr over 30 Minutes Intravenous Every 6 hours 02/11/20 1115 02/14/20 1337       I have personally reviewed the following labs and images: CBC: Recent Labs  Lab 02/11/20 0436 02/11/20 0436 02/13/20 0437 02/13/20 0551 02/15/20 0728 02/16/20 0247 02/17/20 0412  WBC 13.4*  --  10.3  --  10.3 11.4* 14.2*  NEUTROABS 10.0*  --   --   --   --   --   --   HGB 11.4*   < >  9.4* 10.2* 9.5* 8.8* 9.5*  HCT 33.9*   < > 29.4* 30.0* 29.4* 27.3* 29.6*  MCV 89.7  --  93.3  --  93.6 92.9 93.1  PLT 204  --  159  --  153 188 181   < > = values in this interval not displayed.   BMP &GFR Recent Labs  Lab 02/10/20 1805 02/10/20 2153 02/11/20 0436 02/11/20 0816 02/12/20 [id number] 02/12/20 [id number] 02/13/20 0437 02/13/20 0437 02/13/20 0551 02/13/20 0559 02/14/20 0416 02/15/20 0728 02/15/20 1143 02/16/20 0247 02/17/20 0412  NA   < >  --  133*   < > 137   < > 140   < > 140  --  139 135  --  134* 136  K   < >  --  4.0   < > 3.5   < > 3.7   < > 3.6  --  3.4* 3.8  --  3.0* 4.1  CL   < >  --  103   < > 108   < > 108   < > 108  --  109 107  --  103 106  CO2   < >  --  17*   < > 20*   < > 20*  --   --   --  20* 18*  --  19* 19*  GLUCOSE   < >  --  144*   < > 148*   < > 208*   < > 187*  --  81 91  --  74 154*  BUN   < >  --  27*   < > 23*   < > 19   < > 17  --  12 8  --  7 8  CREATININE   < >  --  1.91*   < > 1.40*   < > 1.27*   < > 1.10*  --  1.21* 1.25*  --  1.31* 1.00  CALCIUM   < >  --  6.6*   < > 5.9*   < > 5.3*   < >  --   --  4.9* 4.4* 4.3 4.8* 5.0*  MG   < > 1.2* 1.8   < > 1.7  --   --   --   --  2.2  --   --  1.5* 1.4* 1.8    PHOS  --  2.7 3.1  --   --   --   --   --   --   --   --  1.4*  --  2.2* 1.3*   < > = values in this interval not displayed.   Estimated Creatinine Clearance: 83 mL/min (by C-G formula based on SCr of 1 mg/dL). Liver & Pancreas: Recent Labs  Lab 02/10/20 2019 02/10/20 2019 02/11/20 0436 02/14/20 1350 02/15/20 0728 02/16/20 0247 02/17/20 0412  AST 582*  --   --  243* 143* 90* 50*  ALT 294*  --   --  337* 215* 154* 100*  ALKPHOS 58  --   --  103 107 145* 124  BILITOT 0.7  --   --  0.6 0.7 0.7 0.7  PROT 5.5*  --   --  5.8* 5.9* 6.3* 6.2*  ALBUMIN 2.5*   < > 2.3* 1.9* 1.9*   1.9* 1.8*   1.8* 1.7*   1.7*   < > = values in this interval not displayed.   Recent Labs  Lab 02/10/20 2153  02/14/20 0416 02/15/20 1143 02/16/20 0247 02/17/20 0412  LIPASE 1,879* 941* 349* 348* 297*   No results for input(s): AMMONIA in the last 168 hours. Diabetic: No results for input(s): HGBA1C in the last 72 hours. Recent Labs  Lab 02/16/20 1130 02/16/20 1709 02/16/20 2106 02/17/20 0747 02/17/20 1154  GLUCAP 101* 132* 201* 141* 202*   Cardiac Enzymes: Recent Labs  Lab 02/14/20 1350 02/15/20 0728 02/15/20 1442 02/16/20 0247 02/17/20 0412  CKTOTAL 855* 954* 931* 994* 843*  CKMB  --   --  4.9  --   --    No results for input(s): PROBNP in the last 8760 hours. Coagulation Profile: Recent Labs  Lab 02/15/20 1143 02/16/20 0247  INR 1.3* 1.3*   Thyroid Function Tests: No results for input(s): TSH, T4TOTAL, FREET4, T3FREE, THYROIDAB in the last 72 hours. Lipid Profile: No results for input(s): CHOL, HDL, LDLCALC, TRIG, CHOLHDL, LDLDIRECT in the last 72 hours. Anemia Panel: Recent Labs    02/15/20 1601  VITAMINB12 1,489*  FOLATE 11.4  FERRITIN 531*  TIBC 253  IRON 11*  RETICCTPCT 1.5   Urine analysis:    Component Value Date/Time   COLORURINE YELLOW 02/10/2020 0617   APPEARANCEUR CLOUDY (A) 02/10/2020 0617   LABSPEC 1.015 02/10/2020 0617   PHURINE 5.0 02/10/2020 0617    GLUCOSEU >=500 (A) 02/10/2020 0617   HGBUR MODERATE (A) 02/10/2020 0617   BILIRUBINUR NEGATIVE 02/10/2020 0617   BILIRUBINUR negative 07/10/2019 1445   BILIRUBINUR neg 09/28/2017 0907   KETONESUR NEGATIVE 02/10/2020 0617   PROTEINUR 100 (A) 02/10/2020 0617   UROBILINOGEN 0.2 07/10/2019 1445   UROBILINOGEN 0.2 07/16/2015 2024   NITRITE NEGATIVE 02/10/2020 0617   LEUKOCYTESUR NEGATIVE 02/10/2020 0617   Sepsis Labs: Invalid input(s): PROCALCITONIN, [employer]  Microbiology: Recent Results (from the past 240 hour(s))  Respiratory Panel by RT PCR (Flu A&B, Covid) - Nasopharyngeal Swab     Status: None   Collection Time: 02/10/20 12:30 AM   Specimen: Nasopharyngeal Swab  Result Value Ref Range Status   SARS Coronavirus 2 by RT PCR NEGATIVE NEGATIVE Final    Comment: (NOTE) SARS-CoV-2 target nucleic acids are NOT DETECTED. The SARS-CoV-2 RNA is generally detectable in upper respiratoy specimens during the acute phase of infection. The lowest concentration of SARS-CoV-2 viral copies this assay can detect is 131 copies/mL. A negative result does not preclude SARS-Cov-2 infection and should not be used as the sole basis for treatment or other patient management decisions. A negative result may occur with  improper specimen collection/handling, submission of specimen other than nasopharyngeal swab, presence of viral mutation(s) within the areas targeted by this assay, and inadequate number of viral copies (<131 copies/mL). A negative result must be combined with clinical observations, patient history, and epidemiological information. The expected result is Negative. Fact Sheet for Patients:  PinkCheek.be Fact Sheet for Healthcare Providers:  GravelBags.it This test is not yet ap proved or cleared by the [place] FDA and  has been authorized for detection and/or diagnosis of SARS-CoV-2 by FDA under an Emergency Use  Authorization (EUA). This EUA will remain  in effect (meaning this test can be used) for the duration of the COVID-19 declaration under Section 564(b)(1) of the Act, 21 U.S.C. section 360bbb-3(b)(1), unless the authorization is terminated or revoked sooner.    Influenza A by PCR NEGATIVE NEGATIVE Final   Influenza B by PCR NEGATIVE NEGATIVE Final    Comment: (NOTE) The Xpert Xpress SARS-CoV-2/FLU/RSV assay is intended as an aid in  the  diagnosis of influenza from Nasopharyngeal swab specimens and  should not be used as a sole basis for treatment. Nasal washings and  aspirates are unacceptable for Xpert Xpress SARS-CoV-2/FLU/RSV  testing. Fact Sheet for Patients: PinkCheek.be Fact Sheet for Healthcare Providers: GravelBags.it This test is not yet approved or cleared by the [place] FDA and  has been authorized for detection and/or diagnosis of SARS-CoV-2 by  FDA under an Emergency Use Authorization (EUA). This EUA will remain  in effect (meaning this test can be used) for the duration of the  Covid-19 declaration under Section 564(b)(1) of the Act, 21  U.S.C. section 360bbb-3(b)(1), unless the authorization is  terminated or revoked. Performed at [facility], [place] [address] [place], [place] [address]   MRSA PCR Screening     Status: None   Collection Time: 02/10/20  3:40 AM   Specimen: Nasal Mucosa; Nasopharyngeal  Result Value Ref Range Status   MRSA by PCR NEGATIVE NEGATIVE Final    Comment:        The GeneXpert MRSA Assay (FDA approved for NASAL specimens only), is one component of a comprehensive MRSA colonization surveillance program. It is not intended to diagnose MRSA infection nor to guide or monitor treatment for MRSA infections. Performed at [facility], [place] [address], [place], [place] [address]   Culture, blood (routine x 2)     Status: None   Collection Time: 02/10/20  9:26 PM    Specimen: BLOOD RIGHT HAND  Result Value Ref Range Status   Specimen Description BLOOD RIGHT HAND  Final   Special Requests   Final    BOTTLES DRAWN AEROBIC ONLY Blood Culture results may not be optimal due to an inadequate volume of blood received in culture bottles   Culture   Final    NO GROWTH 5 DAYS Performed at [facility], [place] [address], [place], [place] [address]    Report Status 02/15/2020 FINAL  Final  Culture, blood (routine x 2)     Status: None   Collection Time: 02/10/20  9:27 PM   Specimen: BLOOD  Result Value Ref Range Status   Specimen Description BLOOD RIGHT ANTECUBITAL  Final   Special Requests   Final    BOTTLES DRAWN AEROBIC ONLY Blood Culture adequate volume   Culture   Final    NO GROWTH 5 DAYS Performed at [facility], [place] [address], [place], [place] [address]    Report Status 02/15/2020 FINAL  Final  Surgical PCR screen     Status: None   Collection Time: 02/13/20  1:11 AM   Specimen: Nasal Mucosa; Nasal Swab  Result Value Ref Range Status   MRSA, PCR NEGATIVE NEGATIVE Final   Staphylococcus aureus NEGATIVE NEGATIVE Final    Comment: (NOTE) The Xpert SA Assay (FDA approved for NASAL specimens in patients 40 years of age and older), is one component of a comprehensive surveillance program. It is not intended to diagnose infection nor to guide or monitor treatment. Performed at [facility], [place] [address] [place], [place] [address]     Radiology Studies: No results found.   Dana Allan, MD [facility]  If 7PM-7AM, please contact night-coverage [email or web address] Password [facility] 02/17/2020, 1:15 PM

## 2020-02-18 LAB — CBC WITH DIFFERENTIAL/PLATELET
Abs Immature Granulocytes: 0.32 10*3/uL — ABNORMAL HIGH (ref 0.00–0.07)
Basophils Absolute: 0.1 10*3/uL (ref 0.0–0.1)
Basophils Relative: 0 %
Eosinophils Absolute: 0.2 10*3/uL (ref 0.0–0.5)
Eosinophils Relative: 1 %
HCT: 29.5 % — ABNORMAL LOW (ref 36.0–46.0)
Hemoglobin: 9.3 g/dL — ABNORMAL LOW (ref 12.0–15.0)
Immature Granulocytes: 2 %
Lymphocytes Relative: 7 %
Lymphs Abs: 1.5 10*3/uL (ref 0.7–4.0)
MCH: 29.1 pg (ref 26.0–34.0)
MCHC: 31.5 g/dL (ref 30.0–36.0)
MCV: 92.2 fL (ref 80.0–100.0)
Monocytes Absolute: 1.4 10*3/uL — ABNORMAL HIGH (ref 0.1–1.0)
Monocytes Relative: 7 %
Neutro Abs: 17.3 10*3/uL — ABNORMAL HIGH (ref 1.7–7.7)
Neutrophils Relative %: 83 %
Platelets: 213 10*3/uL (ref 150–400)
RBC: 3.2 MIL/uL — ABNORMAL LOW (ref 3.87–5.11)
RDW: 15.1 % (ref 11.5–15.5)
WBC: 20.7 10*3/uL — ABNORMAL HIGH (ref 4.0–10.5)
nRBC: 0.1 % (ref 0.0–0.2)

## 2020-02-18 LAB — GLUCOSE, CAPILLARY
Glucose-Capillary: 185 mg/dL — ABNORMAL HIGH (ref 70–99)
Glucose-Capillary: 204 mg/dL — ABNORMAL HIGH (ref 70–99)
Glucose-Capillary: 249 mg/dL — ABNORMAL HIGH (ref 70–99)
Glucose-Capillary: 285 mg/dL — ABNORMAL HIGH (ref 70–99)

## 2020-02-18 LAB — COMPREHENSIVE METABOLIC PANEL
ALT: 66 U/L — ABNORMAL HIGH (ref 0–44)
AST: 33 U/L (ref 15–41)
Albumin: 1.7 g/dL — ABNORMAL LOW (ref 3.5–5.0)
Alkaline Phosphatase: 117 U/L (ref 38–126)
Anion gap: 13 (ref 5–15)
BUN: 7 mg/dL (ref 6–20)
CO2: 19 mmol/L — ABNORMAL LOW (ref 22–32)
Calcium: 5.6 mg/dL — CL (ref 8.9–10.3)
Chloride: 105 mmol/L (ref 98–111)
Creatinine, Ser: 1.11 mg/dL — ABNORMAL HIGH (ref 0.44–1.00)
GFR calc Af Amer: >60 mL/min (ref >60)
GFR calc non Af Amer: 60 mL/min — ABNORMAL LOW (ref >60)
Glucose, Bld: 159 mg/dL — ABNORMAL HIGH (ref 70–99)
Potassium: 3.9 mmol/L (ref 3.5–5.1)
Sodium: 137 mmol/L (ref 135–145)
Total Bilirubin: 0.7 mg/dL (ref 0.3–1.2)
Total Protein: 6.6 g/dL (ref 6.5–8.1)

## 2020-02-18 LAB — MAGNESIUM: Magnesium: 1.5 mg/dL — ABNORMAL LOW (ref 1.7–2.4)

## 2020-02-18 LAB — CK: Total CK: 515 U/L — ABNORMAL HIGH (ref 38–234)

## 2020-02-18 LAB — PHOSPHORUS: Phosphorus: 1.6 mg/dL — ABNORMAL LOW (ref 2.5–4.6)

## 2020-02-18 MED ORDER — MAGNESIUM SULFATE 2 GM/50ML IV SOLN
2.0000 g | Freq: Once | INTRAVENOUS | Status: AC
  Administered 2020-02-18: 2 g via INTRAVENOUS
  Filled 2020-02-18: qty 50

## 2020-02-18 MED ORDER — CALCIUM GLUCONATE-NACL 2-0.675 GM/100ML-% IV SOLN
2.0000 g | Freq: Once | INTRAVENOUS | Status: AC
  Administered 2020-02-18: 20:00:00 2000 mg via INTRAVENOUS
  Filled 2020-02-18: qty 100

## 2020-02-18 MED ORDER — SODIUM PHOSPHATES 45 MMOLE/15ML IV SOLN
20.0000 mmol | Freq: Once | INTRAVENOUS | Status: AC
  Administered 2020-02-18: 20 mmol via INTRAVENOUS
  Filled 2020-02-18: qty 6.67

## 2020-02-18 NOTE — Progress Notes (Signed)
Physical Therapy Treatment Patient Details Name: Sarah Long MRN: [id number] DOB: 1971-01-18 Today's Date: 02/18/2020    History of Present Illness 49 y/o female w/ hx of obesity, HTN, HSV-2 infection, Herpes, dizzyness, DM, depression, bipolar 1 disorder, temp pacemaker (02/10/20), pacemake implant (02/13/20). Pt had presented to hospital with c/o 10/10 chest pain and elevated troponin, pt was dx with complete AV block, s/p new pacemaker (3/10)    PT Comments    Patient received sitting on side of bed, agrees to PT session. Patient is independent with transfers and ambulated 250 feet without AD with supervision. She  Is easily fatigued and reports some SOB with activity requiring 2-3 standing rest breaks during ambulation. She will continue to benefit from skilled PT while here to improve activity tolerance and strength.   Follow Up Recommendations  Home health PT     Equipment Recommendations  None recommended by PT    Recommendations for Other Services       Precautions / Restrictions Precautions Precautions: ICD/Pacemaker Precaution Comments: mod fall Restrictions Weight Bearing Restrictions: Yes LUE Weight Bearing: Non weight bearing LUE Partial Weight Bearing Percentage or Pounds: pacemaker precautions RLE Weight Bearing: Weight bearing as tolerated Other Position/Activity Restrictions: easily SOB    Mobility  Bed Mobility               General bed mobility comments: received sitting on edge of bed  Transfers Overall transfer level: Independent Equipment used: None Transfers: Sit to/from Stand Sit to Stand: Independent            Ambulation/Gait Ambulation/Gait assistance: Supervision Gait Distance (Feet): 250 Feet Assistive device: None   Gait velocity: dec   General Gait Details: 3 standing rest breaks due to fatigue and SOB. Increased lateral sway with ambulation   Stairs             Wheelchair Mobility    Modified Rankin (Stroke  Patients Only)       Balance Overall balance assessment: Mild deficits observed, not formally tested Sitting-balance support: Feet supported Sitting balance-Leahy Scale: Normal     Standing balance support: No upper extremity supported;During functional activity Standing balance-Leahy Scale: Good Standing balance comment: no LOB or dizziness reported                            Cognition Arousal/Alertness: Awake/alert Behavior During Therapy: WFL for tasks assessed/performed Overall Cognitive Status: Within Functional Limits for tasks assessed                                        Exercises Other Exercises Other Exercises: STS x 5 reps, LAQ x 10 reps bilaterally    General Comments        Pertinent Vitals/Pain Pain Assessment: Faces Faces Pain Scale: Hurts a little bit Pain Location: B LEs due to swelling Pain Descriptors / Indicators: Tightness;Discomfort Pain Intervention(s): Monitored during session    Home Living                      Prior Function            PT Goals (current goals can now be found in the care plan section) Acute Rehab PT Goals Patient Stated Goal: get better PT Goal Formulation: With patient Time For Goal Achievement: 02/28/20 Potential to Achieve Goals: Good Progress towards PT  goals: Progressing toward goals    Frequency    Min 3X/week      PT Plan Current plan remains appropriate    Co-evaluation              AM-PAC PT "6 Clicks" Mobility   Outcome Measure  Help needed turning from your back to your side while in a flat bed without using bedrails?: None Help needed moving from lying on your back to sitting on the side of a flat bed without using bedrails?: None Help needed moving to and from a bed to a chair (including a wheelchair)?: None Help needed standing up from a chair using your arms (e.g., wheelchair or bedside chair)?: None Help needed to walk in hospital room?:  None Help needed climbing 3-5 steps with a railing? : A Little 6 Click Score: 23    End of Session   Activity Tolerance: Patient tolerated treatment well Patient left: with call bell/phone within reach;in bed Nurse Communication: Mobility status PT Visit Diagnosis: Difficulty in walking, not elsewhere classified (R26.2);Pain;Muscle weakness (generalized) (M62.81) Pain - part of body: Leg     Time: 1100-1120 PT Time Calculation (min) (ACUTE ONLY): 20 min  Charges:  $Therapeutic Exercise: 8-22 mins                     [name], PT, GCS 02/18/20,11:30 AM

## 2020-02-18 NOTE — Plan of Care (Signed)

## 2020-02-18 NOTE — Progress Notes (Addendum)
Inpatient Diabetes Program Recommendations  AACE/ADA: New Consensus Statement on Inpatient Glycemic Control (2015)  Target Ranges:  Prepandial:   less than 140 mg/dL      Peak postprandial:   less than 180 mg/dL (1-2 hours)      Critically ill patients:  140 - 180 mg/dL   Lab Results  Component Value Date   GLUCAP 312 (H) 02/17/2020   HGBA1C 12.7 (H) 02/11/2020    Review of Glycemic Control Results for ANICE, GANNETT (MRN [id number]) as of 02/18/2020 09:24  Ref. Range 02/17/2020 07:47 02/17/2020 11:54 02/17/2020 17:48 02/17/2020 21:49  Glucose-Capillary Latest Ref Range: 70 - 99 mg/dL 141 (H) 202 (H) 275 (H) 312 (H)   Diabetes history:DM2 Outpatient Diabetes medications:Novolog 8 units TID with meals + Basaglar 80 units QD (pt reports no insulin in 3 months) Current orders for Inpatient glycemic control:  Novolog 0-6 units tid + hs  Ensure Enlive tid between meals  Inpatient Diabetes Program Recommendations:    Glucose trends increase after meal intake. Noted Calorie count.  Pt may benefit from Novolog 3 units tid if consuming at least 50% of meals/supplement.  Thanks, Tama Headings RN, MSN, BC-ADM Inpatient Diabetes Coordinator Team Pager [phone] (8a-5p)

## 2020-02-18 NOTE — Progress Notes (Signed)
Calorie Count Note  **RD working remotely**  48 hour calorie count ordered.  Diet: Heart Healthy  Supplements: Ensure Enlive TID  Breakfast: 352 calories, 8 grams of protein Lunch: 526 calories, 15 grams of protein Dinner: 293 calories, 9 grams of protein Supplements: Unsure whether patient is consuming Ensure Enlive at this time; unable to reach patient via phone and RN unsure of consumption. If the patient is drinking the supplements, each provides 350 kcal and 20 grams of protein. RD will follow-tomorrow.   Total intake: 1171 kcal (61% of minimum estimated needs)  32 grams of protein (27% of minimum estimated needs)  Nutrition Dx: Inadequate oral intake related to poor appetite as evidenced by per patient/family report  Goal: Patient will meet greater than or equal to 90% of their needs  Intervention: Continue Ensure Enlive TID  Larkin Ina, MS, RD, LDN RD pager number and weekend/on-call pager number located in [place].

## 2020-02-18 NOTE — Progress Notes (Signed)
PROGRESS NOTE  Sarah Long [id number] DOB: 1971-09-02   PCP: [name], MD  Patient is from: home  DOA: 02/09/2020 LOS: 8  Brief Narrative / Interim history: 49 yr old obese F w/ significant PMHx alcohol abuse, tobacco abuse, IDDM, HTN, HLD and morbid obesity presented to [facility] with 10/10 chest pain. Per the triage note pt reported vomiting all day, being short of breath and having severe chest pain feeling like she was going to pass out. She denied loss of consciousness or syncopal episode.   Her initial Vitals were 133/64mmHg HR 41 RR 26 O2 sat 94% .  CBG>600mg /dl.  Bicarb obtained.  AG 30. Na 131.  K5.3. Cr 2.85.  Lipase 1879 AST 582.  ALT 294.  EKG showed an  Idioventricular rhythm with LAD, AVB, LVH and an inferior infarct (QRS 162 ms and QT/QTc 574/467 ms).  Troponin 730>> 2000.   Patient went into complete AVB with HR of 39 while in ED.  Started on external pacer.  Done she becomes somnolent after IV Versed requiring intubation.  Cardiology consulted and she was admitted to ICU.  She was also started on Unasyn for possible aspiration pneumonia.  Patient underwent LHC on 3/7 which was negative for CAD.  She had temporary venous pacemaker advanced at the same time.  Echo with EF of 40 to 45%, global hypokinesis, severe LVH and indeterminate diastolic function but no other significant finding.  She eventually had permanent PPM/ICD implant on 02/13/2020.  Patient was extubated on 02/11/2020, and has been vomiting.  Patient also with acute uncomplicated pancreatitis, rhabdomyolysis, significant hypocalcemia and vitamin D deficiency, transaminitis and iron deficiency anemia.   CT abdomen and pelvis on 3/7 and 3/12 showed acute uncomplicated pancreatitis with some inflammatory changes of the colon and small bowel but no abscess or pseudocyst.   02/17/2020: Patient seen alongside patient's nurse.  No new complaints.  Calcium remains low (5) with albumin of 1.7, likely complication of  acute pancreatitis.  Lipase is 297, and CPK is 843.  Serum creatinine is 1.  Potassium is 4.1.  02/18/2020: Calcium is 5.6 today, magnesium is 1.5, phosphorus is 1.6 and CPK is 515.  Continue to monitor and replete normal electrolytes IV.  Discharge back home once replacement can be done safely orally.  No new complaints today.  Subjective: No new complaints No abdominal pain No shortness of breath  Objective: Vitals:   02/17/20 1500 02/17/20 2154 02/18/20 0433 02/18/20 1325  BP: 139/76 136/78 113/69 121/70  Pulse: (!) 103 100 92 94  Resp: 19 19 19 20   Temp: 98.4 F (36.9 C) 98.9 F (37.2 C) 98.5 F (36.9 C) 98 F (36.7 C)  TempSrc: Oral Oral Oral Oral  SpO2: 100% 100% 100%   Weight:   103.1 kg   Height:        Intake/Output Summary (Last 24 hours) at 02/18/2020 1759 Last data filed at 02/18/2020 1629 Gross per 24 hour  Intake 622.84 ml  Output 600 ml  Net 22.84 ml   Filed Weights   02/15/20 0425 02/17/20 0331 02/18/20 0433  Weight: 106.6 kg 105 kg 103.1 kg    Examination:  GENERAL: No acute distress.  Patient seems volume overloaded.  We will hold IV fluid.  HEENT: Pallor. No jaundice.  NECK: Supple.  No apparent JVD.  RESP: Clear to auscultation CVS:  RRR. Heart sounds normal.  ABD/GI/GU: Obese, soft and nontender.  Organs are difficult to assess EXT: Bilateral leg edema, seems chronic. NEURO:  Awake and alert.  Patient moves all extremities.  Procedures:  ETT 3/7-3/8 LHC/temporary internal venous pacer on 3/7 PPM/ICD placement on 3/10 2D echo on 3/7-LVEF 40 to 45%, global hypokinesis.  Severe LVH  Assessment & Plan: Complete Heart Block/bifascicular block (RBBB and LAFB)  -s/p PPM and ICD on 02/13/2020.  Device interrogation normal. -Cardiology following.  Non-STEMI: presented with severe chest pain. Troponin elevated to 730> 2000. EKG showed an  Idioventricular rhythm with LAD, AVB, LVH and an inferior infarct (QRS 162 ms and QT/QTc 574/467 ms). LHC on 3/5  without CAD.  Now chest pain-free.  Intermittent shortness of breath. -LHC on 3/7 without CAD.  -Continue to monitor.  New systolic CHF: Echo on 3/7 with LVEF of 40 to 45%, global hypokinesis, severe LVH.  Repeat CXR on 3/11 with improved aeration with decreasing vascular congestion and bibasilar atelectasis.  On room air.  Trace edema on exam.  Reports intermittent shortness of breath which is likely from atelectasis -PPM/ICD as above -Continue IV fluids for rhabdomyolysis -Monitor fluid status, renal function and electrolytes. 02/17/2020: Hold IV fluids.  Continue to monitor renal function and CPK.  Acute hypoxic respiratory failure: Was intubated due to somnolence after IV Versed on presentation.  Also new CHF and possible aspiration pneumonia. Now on room air but feels short of breath intermittently likely due to atelectasis -Unasyn 3/8-3/11.  Augmentin 3/11-3/13 -Encouraged incentive spirometry 01/20/2020: Stable.  Continue to monitor closely.  Uncontrolled DM-2 with hypoglycemia: Presented with DKA that has resolved.  A1c 12.7%.  Now with refractory hypoglycemia.  C-peptide and insulin level within normal range. Recent Labs    02/18/20 1016 02/18/20 1158 02/18/20 1602  GLUCAP 185* 204* 249*  -Reduced SSI to very thin -D5-NS-KCl at 75 cc an hour in the setting of rhabdo and hypoglycemia -RD and diabetic coordinator following. 02/17/2020: Control is improving.  Acute alcoholic pancreatitis: CT abdomen and pelvis on 3/7 with acute uncomplicated pancreatitis, fatty liver and gallbladder sludge.  Repeat CT on 3/12 with acute pancreatitis and associated inflammatory changes of colon and small bowel.  Lipase 1900> 941> 348.  AST/ALT 582/294 but improving.  ALP and total bili within normal.  RUQ [place] with steatosis.  Triglyceride normal.  Calcium is very low.  Continues to have abdominal pain intermittently.  -Trend lipase and LFT -Replenish calcium -Hold statin. 02/18/2020: Counseled to quit  alcohol use.  Acute kidney injury: Cr 2.85 (admit) >>> 1.21> 1.25> 1.31.  Multifactorial including cardiorenal, rhabdo and iatrogenic from ARB/HCTZ and NSAIDs.  Improving. -IV fluid as above. -Avoid nephrotoxic meds -Continue monitoring 02/17/2020: Resolved.  Serum creatinine today is 1.  Leukocytosis:  -WBC is 14.2 today (up from 11.4) -Continue to monitor closely. -Repeat CBC tomorrow -Low threshold to repeat CT abdomen and pelvis.  Hypocalcemia/vitamin D deficiency: Calcium 4.8.  Multifactorial including vitamin D deficiency (< 4.2), rhabdo and acute pancreatitis.  PTH is appropriately high.  Urine calcium appropriately low at 4.3. -Calcium gluconate 3 g -P.o. vitamin D 50,000 IU weekly and 2000 daily-suspect poor observation in the setting of acute pancreatitis 02/18/2020: Continue to replete.  Rhabdomyolysis: Due to alcohol?  Statin?  CK 855> 954> 994.  CK-MB low. -IV fluid as above-increased rate. -Hold statin 02/17/2020: Hold IV fluids. 02/18/2020: CPK is 515 today.  Hyponatremia in the setting of AKI: -IV fluid as above. 02/17/2020: Resolved.  Sodium today is 136  Hypocalcemia/hypokalemia/hypomagnesemia/hypophosphatemia -Continue to monitor and replete accordingly.  Lactic acidosis: LA > 11 on admit>>> 2.1.  Anemia: -Likely anemia of chronic  inflammation.   -Hgb 10.5 (admit)>> 10.2> 9.5>8.8.    Alcohol use disorder:  Reportedly drinks about 5-6 drinks including liquors -Counseled. -Consult TOC for resources  Tobacco use disorder: Smokes about a pack a day. -Cessation counseling -Nicotine patch  Morbid obesity: Body mass index is 39.02 kg/m. -Encourage lifestyle change to lose weight -Consult dietitian  Nutrition:  Nutrition Problem: Inadequate oral intake Etiology: poor appetite  Signs/Symptoms: per patient/family report  Interventions: Ensure Enlive (each supplement provides 350kcal and 20 grams of protein), Liberalize Diet   DVT prophylaxis: Subcu  heparin Code Status: Full code Family Communication: Patient and/or RN. Available if any question.   Discharge barrier: Acute pancreatitis, AKI, electrolyte derangements, rhabdomyolysis.  Very high risk for deconditioning.  Requires close monitoring for replenishment of electrolytes.  Patient is from: Home Final disposition: Likely home when medically stable  Consultants: PCCM (off), cardiology, EP,   Microbiology summarized: Influenza PCR negative COVID-19 negative MRSA PCR negative Blood cultures negative  Sch Meds:  Scheduled Meds: . aspirin  81 mg Oral Daily  . Chlorhexidine Gluconate Cloth  6 each Topical Daily  . cholecalciferol  2,000 Units Oral Daily  . feeding supplement (ENSURE ENLIVE)  237 mL Oral TID BM  . ferrous sulfate  325 mg Oral BID WC  . folic acid  1 mg Oral Daily  . heparin  5,000 Units Subcutaneous Q8H  . insulin aspart  0-5 Units Subcutaneous QHS  . insulin aspart  0-6 Units Subcutaneous TID WC  . nicotine  21 mg Transdermal Daily  . pantoprazole  40 mg Oral Daily  . saccharomyces boulardii  250 mg Oral BID  . sodium chloride flush  10-40 mL Intracatheter Q12H  . sodium chloride flush  3 mL Intravenous Q12H  . Vitamin D (Ergocalciferol)  50,000 Units Oral Q7 days   Continuous Infusions: . sodium chloride Stopped (02/13/20 0820)  . sodium chloride    . sodium chloride 250 mL (02/14/20 0043)  . calcium gluconate    . sodium phosphate  Dextrose 5% IVPB 20 mmol (02/18/20 1408)   PRN Meds:.sodium chloride, Place/Maintain arterial line **AND** sodium chloride, sodium chloride, acetaminophen, dextrose, fentaNYL (SUBLIMAZE) injection, Melatonin, ondansetron (ZOFRAN) IV, simethicone, sodium chloride flush, sodium chloride flush, sodium chloride flush  Antimicrobials: Anti-infectives (From admission, onward)   Start     Dose/Rate Route Frequency Ordered Stop   02/14/20 1345  amoxicillin-clavulanate (AUGMENTIN) 875-125 MG per tablet 1 tablet  Status:   Discontinued     1 tablet Oral Every 12 hours 02/14/20 1337 02/16/20 1533   02/13/20 2000  ceFAZolin (ANCEF) IVPB 1 g/50 mL premix     1 g 100 mL/hr over 30 Minutes Intravenous Every 6 hours 02/13/20 1740 02/14/20 0927   02/13/20 1702  gentamicin (GARAMYCIN) 80 mg in sodium chloride 0.9 % 500 mL irrigation  Status:  Discontinued       As needed 02/13/20 1703 02/13/20 1719   02/13/20 0045  gentamicin (GARAMYCIN) 80 mg in sodium chloride 0.9 % 500 mL irrigation  Status:  Discontinued     80 mg Irrigation On call 02/13/20 0042 02/13/20 1734   02/13/20 0045  ceFAZolin (ANCEF) IVPB 2g/100 mL premix     2 g 200 mL/hr over 30 Minutes Intravenous On call 02/13/20 0042 02/13/20 1615   02/11/20 1130  Ampicillin-Sulbactam (UNASYN) 3 g in sodium chloride 0.9 % 100 mL IVPB  Status:  Discontinued     3 g 200 mL/hr over 30 Minutes Intravenous Every 6 hours 02/11/20 1115  02/14/20 1337       I have personally reviewed the following labs and images: CBC: Recent Labs  Lab 02/13/20 0437 02/13/20 0437 02/13/20 0551 02/15/20 0728 02/16/20 0247 02/17/20 0412 02/18/20 0835  WBC 10.3  --   --  10.3 11.4* 14.2* 20.7*  NEUTROABS  --   --   --   --   --   --  17.3*  HGB 9.4*   < > 10.2* 9.5* 8.8* 9.5* 9.3*  HCT 29.4*   < > 30.0* 29.4* 27.3* 29.6* 29.5*  MCV 93.3  --   --  93.6 92.9 93.1 92.2  PLT 159  --   --  153 188 181 213   < > = values in this interval not displayed.   BMP &GFR Recent Labs  Lab 02/13/20 0551 02/13/20 0559 02/14/20 0416 02/14/20 0416 02/15/20 0728 02/15/20 1143 02/16/20 0247 02/17/20 0412 02/18/20 0835  NA   < >  --  139  --  135  --  134* 136 137  K   < >  --  3.4*  --  3.8  --  3.0* 4.1 3.9  CL   < >  --  109  --  107  --  103 106 105  CO2   < >  --  20*  --  18*  --  19* 19* 19*  GLUCOSE   < >  --  81  --  91  --  74 154* 159*  BUN   < >  --  12  --  8  --  7 8 7   CREATININE   < >  --  1.21*  --  1.25*  --  1.31* 1.00 1.11*  CALCIUM   < >  --  4.9*   < > 4.4* 4.3  4.8* 5.0* 5.6*  MG  --  2.2  --   --   --  1.5* 1.4* 1.8 1.5*  PHOS  --   --   --   --  1.4*  --  2.2* 1.3* 1.6*   < > = values in this interval not displayed.   Estimated Creatinine Clearance: 74.1 mL/min (A) (by C-G formula based on SCr of 1.11 mg/dL (H)). Liver & Pancreas: Recent Labs  Lab 02/14/20 1350 02/15/20 0728 02/16/20 0247 02/17/20 0412 02/18/20 0835  AST 243* 143* 90* 50* 33  ALT 337* 215* 154* 100* 66*  ALKPHOS 103 107 145* 124 117  BILITOT 0.6 0.7 0.7 0.7 0.7  PROT 5.8* 5.9* 6.3* 6.2* 6.6  ALBUMIN 1.9* 1.9*  1.9* 1.8*  1.8* 1.7*  1.7* 1.7*   Recent Labs  Lab 02/14/20 0416 02/15/20 1143 02/16/20 0247 02/17/20 0412  LIPASE 941* 349* 348* 297*   No results for input(s): AMMONIA in the last 168 hours. Diabetic: No results for input(s): HGBA1C in the last 72 hours. Recent Labs  Lab 02/17/20 1748 02/17/20 2149 02/18/20 1016 02/18/20 1158 02/18/20 1602  GLUCAP 275* 312* 185* 204* 249*   Cardiac Enzymes: Recent Labs  Lab 02/15/20 0728 02/15/20 1442 02/16/20 0247 02/17/20 0412 02/18/20 0835  CKTOTAL 954* 931* 994* 843* 515*  CKMB  --  4.9  --   --   --    No results for input(s): PROBNP in the last 8760 hours. Coagulation Profile: Recent Labs  Lab 02/15/20 1143 02/16/20 0247  INR 1.3* 1.3*   Thyroid Function Tests: No results for input(s): TSH, T4TOTAL, FREET4, T3FREE, THYROIDAB in the last 72 hours.  Lipid Profile: No results for input(s): CHOL, HDL, LDLCALC, TRIG, CHOLHDL, LDLDIRECT in the last 72 hours. Anemia Panel: No results for input(s): VITAMINB12, FOLATE, FERRITIN, TIBC, IRON, RETICCTPCT in the last 72 hours. Urine analysis:    Component Value Date/Time   COLORURINE YELLOW 02/10/2020 0617   APPEARANCEUR CLOUDY (A) 02/10/2020 0617   LABSPEC 1.015 02/10/2020 0617   PHURINE 5.0 02/10/2020 0617   GLUCOSEU >=500 (A) 02/10/2020 0617   HGBUR MODERATE (A) 02/10/2020 0617   BILIRUBINUR NEGATIVE 02/10/2020 0617   BILIRUBINUR negative  07/10/2019 1445   BILIRUBINUR neg 09/28/2017 0907   KETONESUR NEGATIVE 02/10/2020 0617   PROTEINUR 100 (A) 02/10/2020 0617   UROBILINOGEN 0.2 07/10/2019 1445   UROBILINOGEN 0.2 07/16/2015 2024   NITRITE NEGATIVE 02/10/2020 0617   LEUKOCYTESUR NEGATIVE 02/10/2020 0617   Sepsis Labs: Invalid input(s): PROCALCITONIN, [place]  Microbiology: Recent Results (from the past 240 hour(s))  Respiratory Panel by RT PCR (Flu A&B, Covid) - Nasopharyngeal Swab     Status: None   Collection Time: 02/10/20 12:30 AM   Specimen: Nasopharyngeal Swab  Result Value Ref Range Status   SARS Coronavirus 2 by RT PCR NEGATIVE NEGATIVE Final    Comment: (NOTE) SARS-CoV-2 target nucleic acids are NOT DETECTED. The SARS-CoV-2 RNA is generally detectable in upper respiratoy specimens during the acute phase of infection. The lowest concentration of SARS-CoV-2 viral copies this assay can detect is 131 copies/mL. A negative result does not preclude SARS-Cov-2 infection and should not be used as the sole basis for treatment or other patient management decisions. A negative result may occur with  improper specimen collection/handling, submission of specimen other than nasopharyngeal swab, presence of viral mutation(s) within the areas targeted by this assay, and inadequate number of viral copies (<131 copies/mL). A negative result must be combined with clinical observations, patient history, and epidemiological information. The expected result is Negative. Fact Sheet for Patients:  PinkCheek.be Fact Sheet for Healthcare Providers:  GravelBags.it This test is not yet ap proved or cleared by the [place] FDA and  has been authorized for detection and/or diagnosis of SARS-CoV-2 by FDA under an Emergency Use Authorization (EUA). This EUA will remain  in effect (meaning this test can be used) for the duration of the COVID-19 declaration under Section  564(b)(1) of the Act, 21 U.S.C. section 360bbb-3(b)(1), unless the authorization is terminated or revoked sooner.    Influenza A by PCR NEGATIVE NEGATIVE Final   Influenza B by PCR NEGATIVE NEGATIVE Final    Comment: (NOTE) The Xpert Xpress SARS-CoV-2/FLU/RSV assay is intended as an aid in  the diagnosis of influenza from Nasopharyngeal swab specimens and  should not be used as a sole basis for treatment. Nasal washings and  aspirates are unacceptable for Xpert Xpress SARS-CoV-2/FLU/RSV  testing. Fact Sheet for Patients: PinkCheek.be Fact Sheet for Healthcare Providers: GravelBags.it This test is not yet approved or cleared by the [place] FDA and  has been authorized for detection and/or diagnosis of SARS-CoV-2 by  FDA under an Emergency Use Authorization (EUA). This EUA will remain  in effect (meaning this test can be used) for the duration of the  Covid-19 declaration under Section 564(b)(1) of the Act, 21  U.S.C. section 360bbb-3(b)(1), unless the authorization is  terminated or revoked. Performed at [facility], [place] [address], [place], [place] [address]   MRSA PCR Screening     Status: None   Collection Time: 02/10/20  3:40 AM   Specimen: Nasal Mucosa; Nasopharyngeal  Result  Value Ref Range Status   MRSA by PCR NEGATIVE NEGATIVE Final    Comment:        The GeneXpert MRSA Assay (FDA approved for NASAL specimens only), is one component of a comprehensive MRSA colonization surveillance program. It is not intended to diagnose MRSA infection nor to guide or monitor treatment for MRSA infections. Performed at [facility], [place] [address] [place], [place] [address]   Culture, blood (routine x 2)     Status: None   Collection Time: 02/10/20  9:26 PM   Specimen: BLOOD RIGHT HAND  Result Value Ref Range Status   Specimen Description BLOOD RIGHT HAND  Final   Special Requests   Final    BOTTLES  DRAWN AEROBIC ONLY Blood Culture results may not be optimal due to an inadequate volume of blood received in culture bottles   Culture   Final    NO GROWTH 5 DAYS Performed at [facility], [place] [address], [place], [place] [address]    Report Status 02/15/2020 FINAL  Final  Culture, blood (routine x 2)     Status: None   Collection Time: 02/10/20  9:27 PM   Specimen: BLOOD  Result Value Ref Range Status   Specimen Description BLOOD RIGHT ANTECUBITAL  Final   Special Requests   Final    BOTTLES DRAWN AEROBIC ONLY Blood Culture adequate volume   Culture   Final    NO GROWTH 5 DAYS Performed at [facility], [place] [address], [place], [place] [address]    Report Status 02/15/2020 FINAL  Final  Surgical PCR screen     Status: None   Collection Time: 02/13/20  1:11 AM   Specimen: Nasal Mucosa; Nasal Swab  Result Value Ref Range Status   MRSA, PCR NEGATIVE NEGATIVE Final   Staphylococcus aureus NEGATIVE NEGATIVE Final    Comment: (NOTE) The Xpert SA Assay (FDA approved for NASAL specimens in patients 13 years of age and older), is one component of a comprehensive surveillance program. It is not intended to diagnose infection nor to guide or monitor treatment. Performed at [facility], [place] [address], [place], [place] [address]     Radiology Studies: No results found.   Dana Allan, MD [facility]  If 7PM-7AM, please contact night-coverage [email or web address] Password [facility] 02/18/2020, 5:59 PM

## 2020-02-18 NOTE — Progress Notes (Addendum)
Occupational Therapy Treatment Patient Details Name: Sarah Long MRN: [id number] DOB: 1971-04-25 Today's Date: 02/18/2020    History of present illness 49 y/o female w/ hx of obesity, HTN, HSV-2 infection, Herpes, dizzyness, DM, depression, bipolar 1 disorder, temp pacemaker (02/10/20), pacemake implant (02/13/20). Pt had presented to hospital with c/o 10/10 chest pain and elevated troponin, pt was dx with complete AV block, s/p new pacemaker (3/10)   OT comments  Patient seated EOB and agreeable to OT.  Patient labile during session about hospitalization, ADL status change since admission, and having a pacemaker now.  Patient requires min assist for LB ADls, utilize sock aide and reacher to don/doff socks but did not provide at this time as patient wants to keep working on this without AE.  Issued long sponge for bathing.  Min cueing to avoid pushing with L UE. Setup for grooming at EOB.  Reports exhaustion and requested to rest.  Will follow acutely.    Follow Up Recommendations  Home health OT;Supervision - Intermittent    Equipment Recommendations  3 in 1 bedside commode;Tub/shower seat    Recommendations for Other Services      Precautions / Restrictions Precautions Precautions: ICD/Pacemaker Precaution Comments: mod fall Restrictions Weight Bearing Restrictions: Yes LUE Weight Bearing: Non weight bearing LUE Partial Weight Bearing Percentage or Pounds: pacemaker precautions RLE Weight Bearing: Weight bearing as tolerated Other Position/Activity Restrictions: easily SOB       Mobility Bed Mobility               General bed mobility comments: EOB upon entry   Transfers Overall transfer level: Independent Equipment used: None Transfers: Sit to/from Stand Sit to Stand: Independent              Balance Overall balance assessment: Mild deficits observed, not formally tested Sitting-balance support: Feet supported Sitting balance-Leahy Scale: Normal      Standing balance support: No upper extremity supported;During functional activity Standing balance-Leahy Scale: Good Standing balance comment: no LOB or dizziness reported                           ADL either performed or assessed with clinical judgement   ADL Overall ADL's : Needs assistance/impaired     Grooming: Set up;Sitting                 Lower Body Dressing Details (indicate cue type and reason): reviewed AE for LB dressing, able doff L sock seated and R sock with reacher; used sock aide to don socks (regular sock aide) but patient tearful about needing equipment to complete basic self care tasks                General ADL Comments: pt seated EOB, focused on AE education     Vision       Perception     Praxis      Cognition Arousal/Alertness: Awake/alert Behavior During Therapy: WFL for tasks assessed/performed Overall Cognitive Status: Within Functional Limits for tasks assessed                                          Exercises Other Exercises Other Exercises: STS x 5 reps, LAQ x 10 reps bilaterally   Shoulder Instructions       General Comments utilized therapeutic use of self as patient tearful about not being  able to engage in basic self care tasks without assist     Pertinent Vitals/ Pain       Pain Assessment: Faces Faces Pain Scale: Hurts a little bit Pain Location: B LEs due to swelling Pain Descriptors / Indicators: Tightness;Discomfort Pain Intervention(s): Monitored during session;Repositioned  Home Living                                          Prior Functioning/Environment              Frequency  Min 2X/week        Progress Toward Goals  OT Goals(current goals can now be found in the care plan section)  Progress towards OT goals: Progressing toward goals  Acute Rehab OT Goals Patient Stated Goal: get better OT Goal Formulation: With patient  Plan Discharge plan  remains appropriate;Frequency remains appropriate    Co-evaluation                 AM-PAC OT "6 Clicks" Daily Activity     Outcome Measure   Help from another person eating meals?: None Help from another person taking care of personal grooming?: A Little Help from another person toileting, which includes using toliet, bedpan, or urinal?: A Little Help from another person bathing (including washing, rinsing, drying)?: A Little Help from another person to put on and taking off regular upper body clothing?: A Little Help from another person to put on and taking off regular lower body clothing?: A Little(with AE ) 6 Click Score: 19    End of Session    OT Visit Diagnosis: Unsteadiness on feet (R26.81);Muscle weakness (generalized) (M62.81)   Activity Tolerance Patient tolerated treatment well   Patient Left with call bell/phone within reach(seated EOB )   Nurse Communication          [id number] OT Time Calculation (min): 27 min  Charges: OT General Charges $OT Visit: 1 Visit OT Treatments $Self Care/Home Management : 23-37 mins  [place] Pager [phone] Office [place] 02/18/2020, 1:04 PM

## 2020-02-19 ENCOUNTER — Inpatient Hospital Stay (HOSPITAL_COMMUNITY): Payer: Medicaid Other

## 2020-02-19 DIAGNOSIS — R197 Diarrhea, unspecified: Secondary | ICD-10-CM

## 2020-02-19 DIAGNOSIS — D72829 Elevated white blood cell count, unspecified: Secondary | ICD-10-CM

## 2020-02-19 LAB — CBC
HCT: 27.1 % — ABNORMAL LOW (ref 36.0–46.0)
Hemoglobin: 8.7 g/dL — ABNORMAL LOW (ref 12.0–15.0)
MCH: 29.1 pg (ref 26.0–34.0)
MCHC: 32.1 g/dL (ref 30.0–36.0)
MCV: 90.6 fL (ref 80.0–100.0)
Platelets: 209 10*3/uL (ref 150–400)
RBC: 2.99 MIL/uL — ABNORMAL LOW (ref 3.87–5.11)
RDW: 15.2 % (ref 11.5–15.5)
WBC: 21.6 10*3/uL — ABNORMAL HIGH (ref 4.0–10.5)
nRBC: 0.1 % (ref 0.0–0.2)

## 2020-02-19 LAB — RENAL FUNCTION PANEL
Albumin: 1.6 g/dL — ABNORMAL LOW (ref 3.5–5.0)
Anion gap: 9 (ref 5–15)
BUN: 8 mg/dL (ref 6–20)
CO2: 20 mmol/L — ABNORMAL LOW (ref 22–32)
Calcium: 5.9 mg/dL — CL (ref 8.9–10.3)
Chloride: 105 mmol/L (ref 98–111)
Creatinine, Ser: 1 mg/dL (ref 0.44–1.00)
GFR calc Af Amer: >60 mL/min (ref >60)
GFR calc non Af Amer: >60 mL/min (ref >60)
Glucose, Bld: 229 mg/dL — ABNORMAL HIGH (ref 70–99)
Phosphorus: 1.9 mg/dL — ABNORMAL LOW (ref 2.5–4.6)
Potassium: 3.6 mmol/L (ref 3.5–5.1)
Sodium: 134 mmol/L — ABNORMAL LOW (ref 135–145)

## 2020-02-19 LAB — GLUCOSE, CAPILLARY
Glucose-Capillary: 201 mg/dL — ABNORMAL HIGH (ref 70–99)
Glucose-Capillary: 268 mg/dL — ABNORMAL HIGH (ref 70–99)
Glucose-Capillary: 261 mg/dL — ABNORMAL HIGH (ref 70–99)
Glucose-Capillary: 293 mg/dL — ABNORMAL HIGH (ref 70–99)

## 2020-02-19 LAB — MAGNESIUM: Magnesium: 1.5 mg/dL — ABNORMAL LOW (ref 1.7–2.4)

## 2020-02-19 MED ORDER — CALCIUM GLUCONATE-NACL 1-0.675 GM/50ML-% IV SOLN
1.0000 g | Freq: Once | INTRAVENOUS | Status: AC
  Administered 2020-02-19: 05:00:00 1000 mg via INTRAVENOUS
  Filled 2020-02-19: qty 50

## 2020-02-19 MED ORDER — CIPROFLOXACIN IN D5W 400 MG/200ML IV SOLN
400.0000 mg | Freq: Two times a day (BID) | INTRAVENOUS | Status: DC
  Administered 2020-02-19 – 2020-02-21 (×5): 400 mg via INTRAVENOUS
  Filled 2020-02-19 (×6): qty 200

## 2020-02-19 MED ORDER — POTASSIUM PHOSPHATES 15 MMOLE/5ML IV SOLN
20.0000 mmol | Freq: Once | INTRAVENOUS | Status: AC
  Administered 2020-02-19: 20 mmol via INTRAVENOUS
  Filled 2020-02-19: qty 6.67

## 2020-02-19 MED ORDER — IOHEXOL 300 MG/ML  SOLN
100.0000 mL | Freq: Once | INTRAMUSCULAR | Status: AC | PRN
  Administered 2020-02-19: 100 mL via INTRAVENOUS

## 2020-02-19 MED ORDER — CALCIUM GLUCONATE-NACL 2-0.675 GM/100ML-% IV SOLN
2.0000 g | Freq: Once | INTRAVENOUS | Status: AC
  Administered 2020-02-19: 2000 mg via INTRAVENOUS
  Filled 2020-02-19: qty 100

## 2020-02-19 MED ORDER — MAGNESIUM SULFATE 2 GM/50ML IV SOLN
2.0000 g | Freq: Once | INTRAVENOUS | Status: AC
  Administered 2020-02-19: 2 g via INTRAVENOUS
  Filled 2020-02-19: qty 50

## 2020-02-19 MED ORDER — BOOST / RESOURCE BREEZE PO LIQD CUSTOM
1.0000 | Freq: Three times a day (TID) | ORAL | Status: DC
  Administered 2020-02-19 – 2020-02-20 (×2): 1 via ORAL

## 2020-02-19 MED ORDER — METRONIDAZOLE IN NACL 5-0.79 MG/ML-% IV SOLN
500.0000 mg | Freq: Three times a day (TID) | INTRAVENOUS | Status: DC
  Administered 2020-02-19 – 2020-02-24 (×16): 500 mg via INTRAVENOUS
  Filled 2020-02-19 (×17): qty 100

## 2020-02-19 NOTE — Progress Notes (Signed)
Calorie Count Note  48 hour calorie count ordered.  Diet: Heart Healthy Supplements: Ensure Enlive TID  Breakfast: 265 kcals, 9 grams protein Lunch: 256 kcals, 10 grams protein Dinner: 442 kcals, 3 grams protein Supplements: Pt refusing Ensure Enlive  Total intake: 963 kcal (50% of minimum estimated needs)  22 grams protein (19% of minimum estimated needs)  Pt reports appetite is slightly improving, but does not like most available meal options given diet order restrictions.  Nutrition Dx: Inadequate oral intake related to poor appetite as evidenced by per patient/family report  Goal: Patient will meet greater than or equal to 90% of their needs  Intervention:  -d/c Ensure Enlive -Boost Breeze po TID, each supplement provides 250 kcal and 9 grams of protein -Magic cup TID with meals, each supplement provides 290 kcal and 9 grams of protein -Recommend liberalizing pt's diet to regular to encourage adequate PO intake   If PO intake does not improve, consider Cortrak for nocturnal feeding to help pt meet calorie/protein needs.    Sarah Ina, MS, RD, LDN RD pager number and weekend/on-call pager number located in [place].

## 2020-02-19 NOTE — TOC Initial Note (Signed)
Transition of Care [facility]) - Initial/Assessment Note    Patient Details  Name: Sarah Long MRN: [id number] Date of Birth: August 26, 1971  Transition of Care [facility]) CM/SW Contact:    Bethena Roys, RN Phone Number: 02/19/2020, 12:13 PM  Clinical Narrative:  Patient presented for complete heart block. Patient is without insurance and primary care provider (PCP) at this time. Appointment scheduled at the clinic and placed on the AVS for PCP. Patient can utilize the clinic at the [facility] and Wellness clinic for medications that range from $4.00-$10.00. Patient will have charity care via [place] for Physical and Occupational Therapy. Start of care to begin within 24-48 hours post transition home. Durable Medical Equipment (DME) to be delivered via [employer]. Orders needs to be placed in the system for DME 3n1 and shower stool. Case Manager will continue to follow for additional transition of care needs.           Expected Discharge Plan: [facility](Charity Care) Barriers to Discharge: Continued Medical Work up   Patient Goals and CMS Choice Patient states their goals for this hospitalization and ongoing recovery are:: "to return home" CMS Medicare.gov Compare Post Acute Care list provided to:: [facility] week -agency) Choice offered to / list presented to : NA  Expected Discharge Plan and Services Expected Discharge Plan: [place] [facility](Charity Care) In-house Referral: NA Discharge Planning Services: CM Consult Post Acute Care Choice: [place] arrangements for the past 2 months: Single Family Home                 DME Arranged: 3-N-1, Shower stool DME Agency: [employer] Date DME Agency Contacted: 02/19/20 Time DME Agency Contacted: 62 Representative spoke with at DME Agency: [place]: PT, OT [place] Agency: [place] ([place]) Date [place]: 02/19/20 Time [place]: 0900    Prior  Living Arrangements/Services Living arrangements for the past 2 months: [place] with:: Significant Other Patient language and need for interpreter reviewed:: Yes Do you feel safe going back to the place where you live?: Yes      Need for Family Participation in Patient Care: Yes (Comment) Care giver support system in place?: Yes (comment)   Criminal Activity/Legal Involvement Pertinent to Current Situation/Hospitalization: No - Comment as needed  Activities of Daily Living Home Assistive Devices/Equipment: None ADL Screening (condition at time of admission) Patient's cognitive ability adequate to safely complete daily activities?: Yes Is the patient deaf or have difficulty hearing?: No Does the patient have difficulty seeing, even when wearing glasses/contacts?: No Does the patient have difficulty concentrating, remembering, or making decisions?: No Patient able to express need for assistance with ADLs?: Yes Does the patient have difficulty dressing or bathing?: No Independently performs ADLs?: Yes (appropriate for developmental age) Does the patient have difficulty walking or climbing stairs?: No Weakness of Legs: None Weakness of Arms/Hands: None  Permission Sought/Granted Permission sought to share information with : Family Supports Permission granted to share information with : Yes, Verbal Permission Granted     Permission granted to share info w AGENCY: [employer] and [employer].        Emotional Assessment Appearance:: Appears stated age Attitude/Demeanor/Rapport: Engaged Affect (typically observed): Appropriate Orientation: : Oriented to Situation, Oriented to  Time, Oriented to Place, Oriented to Self Alcohol / Substance Use: Not Applicable Psych Involvement: No (comment)  Admission diagnosis:  Complete heart block (HCC) [I44.2] NSTEMI (non-ST elevated myocardial infarction) ([place]) [I21.4] Heart  block AV complete [facility]) [I44.2] Patient Active Problem List    Diagnosis Date Noted  . Heart block AV complete ([place]) 02/11/2020  . Complete heart block ([place]) 02/10/2020  . Diabetic ketoacidosis without coma associated with type 2 diabetes mellitus ([place])   . Elevated troponin   . NSTEMI (non-ST elevated myocardial infarction) ([place])   . Lactic acid acidosis   . Endotracheally intubated   . ACS (acute coronary syndrome) ([place])   . Acute pancreatitis without infection or necrosis   . Dysmenorrhea 10/02/2019  . DM type 2 with diabetic peripheral neuropathy ([place]) 05/30/2019  . 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 block) 05/29/2019  . Bilateral bunions 05/29/2019  . Chronic right shoulder pain 05/29/2019  . Diabetes type 2, uncontrolled ([place]) 10/21/2016  . Smoking 11/26/2013  . Hypercholesteremia 02/02/2007  . Bipolar 1 disorder ([place]) 02/02/2007  . HYPERTENSION, BENIGN SYSTEMIC 02/02/2007   PCP:  [name], MD Pharmacy:   [facility], [address] [place] [address] Phone: [phone] Fax: [phone]     Social Determinants of Health (SDOH) Interventions    Readmission Risk Interventions No flowsheet data found.

## 2020-02-19 NOTE — Progress Notes (Signed)
PHYSICAL THERAPY NOTE  CLINICAL IMPRESSION: Pt is doing better with mobility, has shown much improvement in balance and coordination also independence. She is frustrated at progress and ability to go home. He major limitations functionally remain activity tolerance and safety with mobility. She has been educated on importance of mobility daily, no matter how small and consistency with this. She verbalizes understanding. Therapist has also spoken to mobility tech to increase mobility daily between disciplines.    02/19/20 1102  PT Visit Information  Last PT Received On 02/19/20  Assistance Needed +1  History of Present Illness 49 y/o female w/ hx of obesity, HTN, HSV-2 infection, Herpes, dizzyness, DM, depression, bipolar 1 disorder, temp pacemaker (02/10/20), pacemake implant (02/13/20). Pt had presented to hospital with c/o 10/10 chest pain and elevated troponin, pt was dx with complete AV block, s/p new pacemaker (3/10)  Subjective Data  Patient Stated Goal emotional about progres  Precautions  Precautions Fall;ICD/Pacemaker  Restrictions  Weight Bearing Restrictions Yes  LUE Weight Bearing PWB  LUE Partial Weight Bearing Percentage or Pounds pacemaker precautions  Pain Assessment  Pain Assessment No/denies pain  Cognition  Arousal/Alertness Awake/alert  Behavior During Therapy WFL for tasks assessed/performed  Overall Cognitive Status Within Functional Limits for tasks assessed  Bed Mobility  General bed mobility comments pt sitting edge of bed at therapist arrival  Transfers  Overall transfer level Modified independent  Transfers Sit to/from Stand  Sit to Stand Modified independent (Device/Increase time)  Ambulation/Gait  Ambulation/Gait assistance Supervision  Gait Distance (Feet) 20 Feet  Assistive device None  Gait Pattern/deviations Wide base of support  Gait velocity dec  Balance  Overall balance assessment Mild deficits observed, not formally tested  PT - End of Session   Activity Tolerance Patient tolerated treatment well  Patient left with call bell/phone within reach;in bed  Nurse Communication Mobility status   PT - Assessment/Plan  PT Plan Current plan remains appropriate  PT Visit Diagnosis Difficulty in walking, not elsewhere classified (R26.2);Pain;Muscle weakness (generalized) (M62.81)  PT Frequency (ACUTE ONLY) Min 3X/week  Follow Up Recommendations Home health PT  PT equipment None recommended by PT  AM-PAC PT "6 Clicks" Mobility Outcome Measure (Version 2)  Help needed turning from your back to your side while in a flat bed without using bedrails? 4  Help needed moving from lying on your back to sitting on the side of a flat bed without using bedrails? 4  Help needed moving to and from a bed to a chair (including a wheelchair)? 4  Help needed standing up from a chair using your arms (e.g., wheelchair or bedside chair)? 4  Help needed to walk in hospital room? 4  Help needed climbing 3-5 steps with a railing?  3  6 Click Score 23  Consider Recommendation of Discharge To: Home with no services  PT Goal Progression  Progress towards PT goals Progressing toward goals  Acute Rehab PT Goals  PT Goal Formulation With patient  Time For Goal Achievement 02/28/20  Potential to Achieve Goals Good  PT Time Calculation  PT Start Time (ACUTE ONLY) 1102  PT Stop Time (ACUTE ONLY) 1125  PT Time Calculation (min) (ACUTE ONLY) 23 min  PT General Charges  $$ ACUTE PT VISIT 1 Visit  PT Treatments  $Gait Training 8-22 mins  $Therapeutic Activity 8-22 mins    Horald Chestnut, PT

## 2020-02-19 NOTE — Progress Notes (Signed)
Pharmacy Antibiotic Note  Sarah Long is a 49 y.o. female admitted on 02/09/2020 with DKA and pancreatitis. Pharmacy has been consulted for ciprofloxacin dosing with concern for worsening intraabdominal infection. Metronidazole ordered by MD.  Plan: Cipro 400mg  IV BID Metronidazole per MD   Height: 5\' 4"  (162.6 cm) Weight: 227 lb (103 kg) IBW/kg (Calculated) : 54.7  Temp (24hrs), Avg:98.3 F (36.8 C), Min:97.5 F (36.4 C), Max:99.4 F (37.4 C)  Recent Labs  Lab 02/15/20 0728 02/16/20 0247 02/17/20 0412 02/18/20 0835 02/19/20 0237  WBC 10.3 11.4* 14.2* 20.7* 21.6*  CREATININE 1.25* 1.31* 1.00 1.11* 1.00    Estimated Creatinine Clearance: 82.1 mL/min (by C-G formula based on SCr of 1 mg/dL).    Allergies  Allergen Reactions  . Strawberry Extract Hives  . Sulfa Antibiotics Hives and Itching    Antimicrobials this admission: Cipro/Flagyl 3/16 >> Unasyn 3/8 >> 3/11 Augmentin 3/12>> 3/13  Microbiology results: 3/10 blood x2- neg  Thank you for allowing pharmacy to be a part of this patient's care.  Arrie Senate, PharmD, BCPS Clinical Pharmacist [phone] Please check AMION for all [place] numbers 02/19/2020

## 2020-02-19 NOTE — Progress Notes (Addendum)
PROGRESS NOTE  Sarah Long [id number] DOB: 07/31/1971   PCP: [name], MD  Patient is from: home  DOA: 02/09/2020 LOS: 9  Brief Narrative / Interim history: 49 yr old obese F w/ significant PMHx alcohol abuse, tobacco abuse, IDDM, HTN, HLD and morbid obesity presented to [facility] with 10/10 chest pain. Per the triage note pt reported vomiting all day, being short of breath and having severe chest pain feeling like she was going to pass out. She denied loss of consciousness or syncopal episode.   Her initial Vitals were 133/53mmHg HR 41 RR 26 O2 sat 94% .  CBG>600mg /dl.  Bicarb obtained.  AG 30. Na 131.  K5.3. Cr 2.85.  Lipase 1879 AST 582.  ALT 294.  EKG showed an  Idioventricular rhythm with LAD, AVB, LVH and an inferior infarct (QRS 162 ms and QT/QTc 574/467 ms).  Troponin 730>> 2000.   Patient went into complete AVB with HR of 39 while in ED.  Started on external pacer.  Done she becomes somnolent after IV Versed requiring intubation.  Cardiology consulted and she was admitted to ICU.  She was also started on Unasyn for possible aspiration pneumonia.  Patient underwent LHC on 3/7 which was negative for CAD.  She had temporary venous pacemaker advanced at the same time.  Echo with EF of 40 to 45%, global hypokinesis, severe LVH and indeterminate diastolic function but no other significant finding.  She eventually had permanent PPM/ICD implant on 02/13/2020.  Patient was extubated on 02/11/2020, and has been vomiting.  Patient also with acute uncomplicated pancreatitis, rhabdomyolysis, significant hypocalcemia and vitamin D deficiency, transaminitis and iron deficiency anemia.   CT abdomen and pelvis on 3/7 and 3/12 showed acute uncomplicated pancreatitis with some inflammatory changes of the colon and small bowel but no abscess or pseudocyst.   02/17/2020: Patient seen alongside patient's nurse.  No new complaints.  Calcium remains low (5) with albumin of 1.7, likely complication of  acute pancreatitis.  Lipase is 297, and CPK is 843.  Serum creatinine is 1.  Potassium is 4.1.  02/18/2020: Calcium is 5.6 today, magnesium is 1.5, phosphorus is 1.6 and CPK is 515.  Continue to monitor and replete normal electrolytes IV.  Discharge back home once replacement can be done safely orally.  No new complaints today.  02/19/2020: Patient seen.  Worsening leukocytosis is noted.  We will proceed with further work-up.  We will get repeat CT scan of the abdomen and pelvis with IV contrast.  Patient reported watery diarrhea.  Will send stool for analysis (fecal leukocyte, ova parasite, gastrointestinal panel, stool culture).  Diarrhea presentation is not suggestive of C. difficile, but cannot entirely rule it.  Hypocalcemia persists, hypophosphatemia persists, hypomagnesemia persists..  Current diarrheal illness will worsen electrolyte problems.  Patient will remain inpatient for now.   Subjective: Reports diarrhea. No fever or chills. No shortness of breath  Objective: Vitals:   02/18/20 1325 02/18/20 2118 02/19/20 0506 02/19/20 1427  BP: 121/70 (!) 148/80 134/74 117/63  Pulse: 94 (!) 102 (!) 101 96  Resp: 20 17 19    Temp: 98 F (36.7 C) 99.4 F (37.4 C) (!) 97.5 F (36.4 C) 98.1 F (36.7 C)  TempSrc: Oral Oral Oral Oral  SpO2:  100% 100%   Weight:   103 kg   Height:        Intake/Output Summary (Last 24 hours) at 02/19/2020 1738 Last data filed at 02/19/2020 1500 Gross per 24 hour  Intake 1309.44 ml  Output --  Net 1309.44 ml   Filed Weights   02/17/20 0331 02/18/20 0433 02/19/20 0506  Weight: 105 kg 103.1 kg 103 kg    Examination:  GENERAL: No acute distress.  Patient seems volume overloaded.  We will hold IV fluid.  HEENT: Pallor. No jaundice.  NECK: Supple.  No apparent JVD.  RESP: Clear to auscultation CVS:  RRR. Heart sounds normal.  ABD/GI/GU: Obese, soft and nontender.  Organs are difficult to assess EXT: Bilateral leg edema, seems chronic. NEURO: Awake and  alert.  Patient moves all extremities.  Procedures:  ETT 3/7-3/8 LHC/temporary internal venous pacer on 3/7 PPM/ICD placement on 3/10 2D echo on 3/7-LVEF 40 to 45%, global hypokinesis.  Severe LVH  Assessment & Plan: Leukocytosis: -Proceed with further work-up as documented above. -Start IV Cipro and Flagyl. -Further management depend on hospital course.  Diarrhea: -Manage supportively. -Fecal leukocyte, ova and parasites, GI panel.  Complete Heart Block/bifascicular block (RBBB and LAFB)  -s/p PPM and ICD on 02/13/2020.  Device interrogation normal. -Cardiology following.  Non-STEMI: presented with severe chest pain. Troponin elevated to 730> 2000. EKG showed an  Idioventricular rhythm with LAD, AVB, LVH and an inferior infarct (QRS 162 ms and QT/QTc 574/467 ms). LHC on 3/5 without CAD.  Now chest pain-free.  Intermittent shortness of breath. -LHC on 3/7 without CAD.  -Continue to monitor.  New systolic CHF: Echo on 3/7 with LVEF of 40 to 45%, global hypokinesis, severe LVH.  Repeat CXR on 3/11 with improved aeration with decreasing vascular congestion and bibasilar atelectasis.  On room air.  Trace edema on exam.  Reports intermittent shortness of breath which is likely from atelectasis -PPM/ICD as above -Continue IV fluids for rhabdomyolysis -Monitor fluid status, renal function and electrolytes. 02/17/2020: Hold IV fluids.  Continue to monitor renal function and CPK.  Acute hypoxic respiratory failure: Was intubated due to somnolence after IV Versed on presentation.  Also new CHF and possible aspiration pneumonia. Now on room air but feels short of breath intermittently likely due to atelectasis -Unasyn 3/8-3/11.  Augmentin 3/11-3/13 -Encouraged incentive spirometry 01/20/2020: Stable.  Continue to monitor closely.  Uncontrolled DM-2 with hypoglycemia: Presented with DKA that has resolved.  A1c 12.7%.  Now with refractory hypoglycemia.  C-peptide and insulin level within normal  range. Recent Labs    02/19/20 0808 02/19/20 1209 02/19/20 1631  GLUCAP 201* 268* 261*  -Reduced SSI to very thin -D5-NS-KCl at 75 cc an hour in the setting of rhabdo and hypoglycemia -RD and diabetic coordinator following. 02/17/2020: Control is improving.  Acute alcoholic pancreatitis: CT abdomen and pelvis on 3/7 with acute uncomplicated pancreatitis, fatty liver and gallbladder sludge.  Repeat CT on 3/12 with acute pancreatitis and associated inflammatory changes of colon and small bowel.  Lipase 1900> 941> 348.  AST/ALT 582/294 but improving.  ALP and total bili within normal.  RUQ [place] with steatosis.  Triglyceride normal.  Calcium is very low.  Continues to have abdominal pain intermittently.  -Trend lipase and LFT -Replenish calcium -Hold statin. 02/18/2020: Counseled to quit alcohol use.  Acute kidney injury: Cr 2.85 (admit) >>> 1.21> 1.25> 1.31.  Multifactorial including cardiorenal, rhabdo and iatrogenic from ARB/HCTZ and NSAIDs.  Improving. -IV fluid as above. -Avoid nephrotoxic meds -Continue monitoring 02/17/2020: Resolved.  Serum creatinine today is 1.  Hypocalcemia/vitamin D deficiency: Calcium 4.8.  Multifactorial including vitamin D deficiency (< 4.2), rhabdo and acute pancreatitis.  PTH is appropriately high.  Urine calcium appropriately low at 4.3. -Calcium gluconate 3  g -P.o. vitamin D 50,000 IU weekly and 2000 daily-suspect poor observation in the setting of acute pancreatitis 02/18/2020: Continue to replete.  Rhabdomyolysis: Due to alcohol?  Statin?  CK 855> 954> 994.  CK-MB low. -IV fluid as above-increased rate. -Hold statin 02/17/2020: Hold IV fluids. 02/18/2020: CPK is 515 today.  Hyponatremia in the setting of AKI: -IV fluid as above. 02/17/2020: Resolved.  Sodium today is 136  Hypocalcemia/hypokalemia/hypomagnesemia/hypophosphatemia -Continue to monitor and replete accordingly.  Lactic acidosis: LA > 11 on admit>>> 2.1.  Anemia: -Likely anemia of  chronic inflammation.   -Hgb 10.5 (admit)>> 10.2> 9.5>8.8.    Alcohol use disorder:  Reportedly drinks about 5-6 drinks including liquors -Counseled. -Consult TOC for resources  Tobacco use disorder: Smokes about a pack a day. -Cessation counseling -Nicotine patch  Morbid obesity: Body mass index is 38.96 kg/m. -Encourage lifestyle change to lose weight -Consult dietitian  Nutrition:  Nutrition Problem: Inadequate oral intake Etiology: poor appetite  Signs/Symptoms: per patient/family report  Interventions: Ensure Enlive (each supplement provides 350kcal and 20 grams of protein), Liberalize Diet   DVT prophylaxis: Subcu heparin Code Status: Full code Family Communication: Patient and/or RN. Available if any question.   Discharge barrier: Acute pancreatitis, AKI, electrolyte derangements, rhabdomyolysis.  Very high risk for deconditioning.  Requires close monitoring for replenishment of electrolytes.  Patient is from: Home Final disposition: Likely home when medically stable  Consultants: PCCM (off), cardiology, EP,   Microbiology summarized: Influenza PCR negative COVID-19 negative MRSA PCR negative Blood cultures negative  Sch Meds:  Scheduled Meds: . aspirin  81 mg Oral Daily  . Chlorhexidine Gluconate Cloth  6 each Topical Daily  . cholecalciferol  2,000 Units Oral Daily  . feeding supplement  1 Container Oral TID BM  . ferrous sulfate  325 mg Oral BID WC  . folic acid  1 mg Oral Daily  . heparin  5,000 Units Subcutaneous Q8H  . insulin aspart  0-5 Units Subcutaneous QHS  . insulin aspart  0-6 Units Subcutaneous TID WC  . nicotine  21 mg Transdermal Daily  . pantoprazole  40 mg Oral Daily  . saccharomyces boulardii  250 mg Oral BID  . sodium chloride flush  10-40 mL Intracatheter Q12H  . sodium chloride flush  3 mL Intravenous Q12H  . Vitamin D (Ergocalciferol)  50,000 Units Oral Q7 days   Continuous Infusions: . sodium chloride Stopped (02/13/20 0820)    . sodium chloride    . sodium chloride 250 mL (02/14/20 0043)  . ciprofloxacin 400 mg (02/19/20 1017)  . metronidazole 500 mg (02/19/20 1659)  . potassium PHOSPHATE IVPB (in mmol) 20 mmol (02/19/20 1249)   PRN Meds:.sodium chloride, Place/Maintain arterial line **AND** sodium chloride, sodium chloride, acetaminophen, dextrose, fentaNYL (SUBLIMAZE) injection, Melatonin, ondansetron (ZOFRAN) IV, simethicone, sodium chloride flush, sodium chloride flush, sodium chloride flush  Antimicrobials: Anti-infectives (From admission, onward)   Start     Dose/Rate Route Frequency Ordered Stop   02/19/20 0900  ciprofloxacin (CIPRO) IVPB 400 mg     400 mg 200 mL/hr over 60 Minutes Intravenous Every 12 hours 02/19/20 0845     02/19/20 0845  metroNIDAZOLE (FLAGYL) IVPB 500 mg     500 mg 100 mL/hr over 60 Minutes Intravenous Every 8 hours 02/19/20 0838     02/14/20 1345  amoxicillin-clavulanate (AUGMENTIN) 875-125 MG per tablet 1 tablet  Status:  Discontinued     1 tablet Oral Every 12 hours 02/14/20 1337 02/16/20 1533   02/13/20 2000  ceFAZolin (ANCEF)  IVPB 1 g/50 mL premix     1 g 100 mL/hr over 30 Minutes Intravenous Every 6 hours 02/13/20 1740 02/14/20 0927   02/13/20 1702  gentamicin (GARAMYCIN) 80 mg in sodium chloride 0.9 % 500 mL irrigation  Status:  Discontinued       As needed 02/13/20 1703 02/13/20 1719   02/13/20 0045  gentamicin (GARAMYCIN) 80 mg in sodium chloride 0.9 % 500 mL irrigation  Status:  Discontinued     80 mg Irrigation On call 02/13/20 0042 02/13/20 1734   02/13/20 0045  ceFAZolin (ANCEF) IVPB 2g/100 mL premix     2 g 200 mL/hr over 30 Minutes Intravenous On call 02/13/20 0042 02/13/20 1615   02/11/20 1130  Ampicillin-Sulbactam (UNASYN) 3 g in sodium chloride 0.9 % 100 mL IVPB  Status:  Discontinued     3 g 200 mL/hr over 30 Minutes Intravenous Every 6 hours 02/11/20 1115 02/14/20 1337       I have personally reviewed the following labs and images: CBC: Recent Labs   Lab 02/15/20 0728 02/16/20 0247 02/17/20 0412 02/18/20 0835 02/19/20 0237  WBC 10.3 11.4* 14.2* 20.7* 21.6*  NEUTROABS  --   --   --  17.3*  --   HGB 9.5* 8.8* 9.5* 9.3* 8.7*  HCT 29.4* 27.3* 29.6* 29.5* 27.1*  MCV 93.6 92.9 93.1 92.2 90.6  PLT 153 188 181 213 209   BMP &GFR Recent Labs  Lab 02/13/20 0559 02/15/20 0728 02/15/20 1143 02/16/20 0247 02/17/20 0412 02/18/20 0835 02/19/20 0237  NA  --  135  --  134* 136 137 134*  K  --  3.8  --  3.0* 4.1 3.9 3.6  CL  --  107  --  103 106 105 105  CO2  --  18*  --  19* 19* 19* 20*  GLUCOSE  --  91  --  74 154* 159* 229*  BUN  --  8  --  7 8 7 8   CREATININE  --  1.25*  --  1.31* 1.00 1.11* 1.00  CALCIUM  --  4.4* 4.3 4.8* 5.0* 5.6* 5.9*  MG   < >  --  1.5* 1.4* 1.8 1.5* 1.5*  PHOS  --  1.4*  --  2.2* 1.3* 1.6* 1.9*   < > = values in this interval not displayed.   Estimated Creatinine Clearance: 82.1 mL/min (by C-G formula based on SCr of 1 mg/dL). Liver & Pancreas: Recent Labs  Lab 02/14/20 1350 02/14/20 1350 02/15/20 0728 02/16/20 0247 02/17/20 0412 02/18/20 0835 02/19/20 0237  AST 243*  --  143* 90* 50* 33  --   ALT 337*  --  215* 154* 100* 66*  --   ALKPHOS 103  --  107 145* 124 117  --   BILITOT 0.6  --  0.7 0.7 0.7 0.7  --   PROT 5.8*  --  5.9* 6.3* 6.2* 6.6  --   ALBUMIN 1.9*   < > 1.9*  1.9* 1.8*  1.8* 1.7*  1.7* 1.7* 1.6*   < > = values in this interval not displayed.   Recent Labs  Lab 02/14/20 0416 02/15/20 1143 02/16/20 0247 02/17/20 0412  LIPASE 941* 349* 348* 297*   No results for input(s): AMMONIA in the last 168 hours. Diabetic: No results for input(s): HGBA1C in the last 72 hours. Recent Labs  Lab 02/18/20 1602 02/18/20 2120 02/19/20 0808 02/19/20 1209 02/19/20 1631  GLUCAP 249* 285* 201* 268* 261*  Cardiac Enzymes: Recent Labs  Lab 02/15/20 0728 02/15/20 1442 02/16/20 0247 02/17/20 0412 02/18/20 0835  CKTOTAL 954* 931* 994* 843* 515*  CKMB  --  4.9  --   --   --    No  results for input(s): PROBNP in the last 8760 hours. Coagulation Profile: Recent Labs  Lab 02/15/20 1143 02/16/20 0247  INR 1.3* 1.3*   Thyroid Function Tests: No results for input(s): TSH, T4TOTAL, FREET4, T3FREE, THYROIDAB in the last 72 hours. Lipid Profile: No results for input(s): CHOL, HDL, LDLCALC, TRIG, CHOLHDL, LDLDIRECT in the last 72 hours. Anemia Panel: No results for input(s): VITAMINB12, FOLATE, FERRITIN, TIBC, IRON, RETICCTPCT in the last 72 hours. Urine analysis:    Component Value Date/Time   COLORURINE YELLOW 02/10/2020 0617   APPEARANCEUR CLOUDY (A) 02/10/2020 0617   LABSPEC 1.015 02/10/2020 0617   PHURINE 5.0 02/10/2020 0617   GLUCOSEU >=500 (A) 02/10/2020 0617   HGBUR MODERATE (A) 02/10/2020 0617   BILIRUBINUR NEGATIVE 02/10/2020 0617   BILIRUBINUR negative 07/10/2019 1445   BILIRUBINUR neg 09/28/2017 0907   KETONESUR NEGATIVE 02/10/2020 0617   PROTEINUR 100 (A) 02/10/2020 0617   UROBILINOGEN 0.2 07/10/2019 1445   UROBILINOGEN 0.2 07/16/2015 2024   NITRITE NEGATIVE 02/10/2020 0617   LEUKOCYTESUR NEGATIVE 02/10/2020 0617   Sepsis Labs: Invalid input(s): PROCALCITONIN, [place]  Microbiology: Recent Results (from the past 240 hour(s))  Respiratory Panel by RT PCR (Flu A&B, Covid) - Nasopharyngeal Swab     Status: None   Collection Time: 02/10/20 12:30 AM   Specimen: Nasopharyngeal Swab  Result Value Ref Range Status   SARS Coronavirus 2 by RT PCR NEGATIVE NEGATIVE Final    Comment: (NOTE) SARS-CoV-2 target nucleic acids are NOT DETECTED. The SARS-CoV-2 RNA is generally detectable in upper respiratoy specimens during the acute phase of infection. The lowest concentration of SARS-CoV-2 viral copies this assay can detect is 131 copies/mL. A negative result does not preclude SARS-Cov-2 infection and should not be used as the sole basis for treatment or other patient management decisions. A negative result may occur with  improper specimen  collection/handling, submission of specimen other than nasopharyngeal swab, presence of viral mutation(s) within the areas targeted by this assay, and inadequate number of viral copies (<131 copies/mL). A negative result must be combined with clinical observations, patient history, and epidemiological information. The expected result is Negative. Fact Sheet for Patients:  PinkCheek.be Fact Sheet for Healthcare Providers:  GravelBags.it This test is not yet ap proved or cleared by the [place] FDA and  has been authorized for detection and/or diagnosis of SARS-CoV-2 by FDA under an Emergency Use Authorization (EUA). This EUA will remain  in effect (meaning this test can be used) for the duration of the COVID-19 declaration under Section 564(b)(1) of the Act, 21 U.S.C. section 360bbb-3(b)(1), unless the authorization is terminated or revoked sooner.    Influenza A by PCR NEGATIVE NEGATIVE Final   Influenza B by PCR NEGATIVE NEGATIVE Final    Comment: (NOTE) The Xpert Xpress SARS-CoV-2/FLU/RSV assay is intended as an aid in  the diagnosis of influenza from Nasopharyngeal swab specimens and  should not be used as a sole basis for treatment. Nasal washings and  aspirates are unacceptable for Xpert Xpress SARS-CoV-2/FLU/RSV  testing. Fact Sheet for Patients: PinkCheek.be Fact Sheet for Healthcare Providers: GravelBags.it This test is not yet approved or cleared by the [place] FDA and  has been authorized for detection and/or diagnosis of SARS-CoV-2 by  FDA under an Emergency  Use Authorization (EUA). This EUA will remain  in effect (meaning this test can be used) for the duration of the  Covid-19 declaration under Section 564(b)(1) of the Act, 21  U.S.C. section 360bbb-3(b)(1), unless the authorization is  terminated or revoked. Performed at [facility], [place] [address] [place], [place] [address]   MRSA PCR Screening     Status: None   Collection Time: 02/10/20  3:40 AM   Specimen: Nasal Mucosa; Nasopharyngeal  Result Value Ref Range Status   MRSA by PCR NEGATIVE NEGATIVE Final    Comment:        The GeneXpert MRSA Assay (FDA approved for NASAL specimens only), is one component of a comprehensive MRSA colonization surveillance program. It is not intended to diagnose MRSA infection nor to guide or monitor treatment for MRSA infections. Performed at [facility], [place] [address] [place], [place] [address]   Culture, blood (routine x 2)     Status: None   Collection Time: 02/10/20  9:26 PM   Specimen: BLOOD RIGHT HAND  Result Value Ref Range Status   Specimen Description BLOOD RIGHT HAND  Final   Special Requests   Final    BOTTLES DRAWN AEROBIC ONLY Blood Culture results may not be optimal due to an inadequate volume of blood received in culture bottles   Culture   Final    NO GROWTH 5 DAYS Performed at [facility], [place] [address] [place], [place] [address]    Report Status 02/15/2020 FINAL  Final  Culture, blood (routine x 2)     Status: None   Collection Time: 02/10/20  9:27 PM   Specimen: BLOOD  Result Value Ref Range Status   Specimen Description BLOOD RIGHT ANTECUBITAL  Final   Special Requests   Final    BOTTLES DRAWN AEROBIC ONLY Blood Culture adequate volume   Culture   Final    NO GROWTH 5 DAYS Performed at [facility], [place] [address], [place], [place] [address]    Report Status 02/15/2020 FINAL  Final  Surgical PCR screen     Status: None   Collection Time: 02/13/20  1:11 AM   Specimen: Nasal Mucosa; Nasal Swab  Result Value Ref Range Status   MRSA, PCR NEGATIVE NEGATIVE Final   Staphylococcus aureus NEGATIVE NEGATIVE Final    Comment: (NOTE) The Xpert SA Assay (FDA approved for NASAL specimens in patients 76 years of age and older), is one component of a comprehensive surveillance  program. It is not intended to diagnose infection nor to guide or monitor treatment. Performed at [facility], [place] [address] [place],  [address]     Radiology Studies: CT ABDOMEN PELVIS W CONTRAST  Result Date: 02/19/2020 CLINICAL DATA:  Right upper quadrant pain, fever, elevated WBC, Murphy sign, acute pancreatitis with worsening leukocytosis EXAM: CT ABDOMEN AND PELVIS WITH CONTRAST TECHNIQUE: Multidetector CT imaging of the abdomen and pelvis was performed using the standard protocol following bolus administration of intravenous contrast. CONTRAST:  174mL OMNIPAQUE IOHEXOL 300 MG/ML  SOLN COMPARISON:  02/15/2020 FINDINGS: Lower chest: Small bilateral pleural effusions and associated atelectasis or consolidation, unchanged compared to prior examination. Hepatobiliary: No solid liver abnormality is seen. No gallstones, gallbladder wall thickening, or biliary dilatation. Pancreas: Diffuse fat stranding about the pancreas and adjacent retroperitoneum, slightly worsened compared to prior examination. There is no parenchymal hypodensity or focal fluid collection. Spleen: Normal in size without significant abnormality. Adrenals/Urinary Tract: Adrenal glands  are unremarkable. Kidneys are normal, without renal calculi, solid lesion, or hydronephrosis. Bladder is unremarkable. Stomach/Bowel: Stomach is within normal limits. Appendix appears normal. No evidence of bowel wall thickening, distention, or inflammatory changes. Vascular/Lymphatic: No significant vascular findings are present. No enlarged abdominal or pelvic lymph nodes. Reproductive: No mass or other significant abnormality. Other: Anasarca. Unchanged small volume ascites. Musculoskeletal: No acute or significant osseous findings. IMPRESSION: 1. Diffuse fat stranding about the pancreas and adjacent retroperitoneum, slightly worsened compared to prior examination, consistent with worsened acute pancreatitis. No evidence of pancreatic  necrosis or focal fluid collection. 2. Unchanged small bilateral pleural effusions and associated atelectasis or consolidation. 3. Unchanged small volume ascites and anasarca. Electronically Signed   By: Eddie Candle M.D.   On: 02/19/2020 16:21     Dana Allan, MD [facility]  If 7PM-7AM, please contact night-coverage [email or web address] Password [facility] 02/19/2020, 5:38 PM

## 2020-02-20 DIAGNOSIS — K8500 Idiopathic acute pancreatitis without necrosis or infection: Secondary | ICD-10-CM

## 2020-02-20 LAB — CBC WITH DIFFERENTIAL/PLATELET
Abs Immature Granulocytes: 0.51 10*3/uL — ABNORMAL HIGH (ref 0.00–0.07)
Basophils Absolute: 0 10*3/uL (ref 0.0–0.1)
Basophils Relative: 0 %
Eosinophils Absolute: 0.1 10*3/uL (ref 0.0–0.5)
Eosinophils Relative: 0 %
HCT: 27.4 % — ABNORMAL LOW (ref 36.0–46.0)
Hemoglobin: 8.8 g/dL — ABNORMAL LOW (ref 12.0–15.0)
Immature Granulocytes: 2 %
Lymphocytes Relative: 6 %
Lymphs Abs: 1.3 10*3/uL (ref 0.7–4.0)
MCH: 29.4 pg (ref 26.0–34.0)
MCHC: 32.1 g/dL (ref 30.0–36.0)
MCV: 91.6 fL (ref 80.0–100.0)
Monocytes Absolute: 1.3 10*3/uL — ABNORMAL HIGH (ref 0.1–1.0)
Monocytes Relative: 6 %
Neutro Abs: 18.1 10*3/uL — ABNORMAL HIGH (ref 1.7–7.7)
Neutrophils Relative %: 86 %
Platelets: 217 10*3/uL (ref 150–400)
RBC: 2.99 MIL/uL — ABNORMAL LOW (ref 3.87–5.11)
RDW: 15.2 % (ref 11.5–15.5)
WBC: 21.3 10*3/uL — ABNORMAL HIGH (ref 4.0–10.5)
nRBC: 0 % (ref 0.0–0.2)

## 2020-02-20 LAB — GLUCOSE, CAPILLARY
Glucose-Capillary: 215 mg/dL — ABNORMAL HIGH (ref 70–99)
Glucose-Capillary: 313 mg/dL — ABNORMAL HIGH (ref 70–99)
Glucose-Capillary: 326 mg/dL — ABNORMAL HIGH (ref 70–99)
Glucose-Capillary: 335 mg/dL — ABNORMAL HIGH (ref 70–99)
Glucose-Capillary: 363 mg/dL — ABNORMAL HIGH (ref 70–99)

## 2020-02-20 MED ORDER — FUROSEMIDE 10 MG/ML IJ SOLN
40.0000 mg | Freq: Once | INTRAMUSCULAR | Status: AC
  Administered 2020-02-20: 40 mg via INTRAVENOUS
  Filled 2020-02-20: qty 4

## 2020-02-20 MED ORDER — PANCRELIPASE (LIP-PROT-AMYL) 12000-38000 UNITS PO CPEP
12000.0000 [IU] | ORAL_CAPSULE | Freq: Three times a day (TID) | ORAL | Status: DC
  Administered 2020-02-20 – 2020-02-22 (×5): 12000 [IU] via ORAL
  Filled 2020-02-20 (×5): qty 1

## 2020-02-20 MED ORDER — POTASSIUM CHLORIDE CRYS ER 20 MEQ PO TBCR
40.0000 meq | EXTENDED_RELEASE_TABLET | Freq: Once | ORAL | Status: AC
  Administered 2020-02-20: 40 meq via ORAL
  Filled 2020-02-20: qty 2

## 2020-02-20 MED ORDER — MAGNESIUM SULFATE 2 GM/50ML IV SOLN
2.0000 g | Freq: Once | INTRAVENOUS | Status: AC
  Administered 2020-02-20: 2 g via INTRAVENOUS
  Filled 2020-02-20: qty 50

## 2020-02-20 NOTE — Progress Notes (Signed)
Physical Therapy Treatment Patient Details Name: Sarah Long MRN: [id number] DOB: [birth year] Today's Date: 02/20/2020    History of Present Illness 49 y/o female w/ hx of obesity, HTN, HSV-2 infection, Herpes, dizzyness, DM, depression, bipolar 1 disorder, temp pacemaker (02/10/20), pacemake implant (02/13/20). Pt had presented to hospital with c/o 10/10 chest pain and elevated troponin, pt was dx with complete AV block, s/p new pacemaker (3/10)    PT Comments    Pt is making progress with mobility was able to complete bed mob w/ mod I, transfers from bed/recliner/chair w/ SBA-mod I, ambulated in room approx 19ft with no AD and SBA w/ 3 standing rest breaks. Pt c/o 8/10 pain in abdomen, nurse was notified, Pt also now on enteric precautions.      Follow Up Recommendations  Home health PT     Equipment Recommendations  None recommended by PT    Recommendations for Other Services       Precautions / Restrictions Precautions Precautions: Fall;ICD/Pacemaker;Other (comment)(enteric) Precaution Comments: min fall Restrictions Weight Bearing Restrictions: Yes LUE Weight Bearing: Partial weight bearing LUE Partial Weight Bearing Percentage or Pounds: pacemaker precautions    Mobility  Bed Mobility Overal bed mobility: Modified Independent             General bed mobility comments: OOB in recliner   Transfers Overall transfer level: Needs assistance Equipment used: None Transfers: Sit to/from [facility] Sit to Stand: Supervision;Modified independent (Device/Increase time) Stand pivot transfers: Supervision;Modified independent (Device/Increase time)       General transfer comment: needs line management assist  Ambulation/Gait Ambulation/Gait assistance: Supervision Gait Distance (Feet): 190 Feet Assistive device: None Gait Pattern/deviations: Wide base of support;Step-through pattern(waddling gait with back hyperextended) Gait velocity: dec    General Gait Details: 3 standing rest breaks   Stairs             Wheelchair Mobility    Modified Rankin (Stroke Patients Only)       Balance Overall balance assessment: Mild deficits observed, not formally tested                                          Cognition Arousal/Alertness: Lethargic Behavior During Therapy: WFL for tasks assessed/performed Overall Cognitive Status: Within Functional Limits for tasks assessed                                        Exercises      General Comments        Pertinent Vitals/Pain Pain Assessment: 0-10 Pain Score: 8  Pain Location: abdomen Pain Intervention(s): Limited activity within patient's tolerance;Patient requesting pain meds-RN notified    Home Living                      Prior Function            PT Goals (current goals can now be found in the care plan section) Acute Rehab PT Goals Patient Stated Goal: get stronger and go home to family PT Goal Formulation: With patient Time For Goal Achievement: 02/28/20 Potential to Achieve Goals: Good Progress towards PT goals: Progressing toward goals    Frequency    Min 3X/week      PT Plan Discharge plan needs to be updated    Co-evaluation  AM-PAC PT "6 Clicks" Mobility   Outcome Measure  Help needed turning from your back to your side while in a flat bed without using bedrails?: None Help needed moving from lying on your back to sitting on the side of a flat bed without using bedrails?: None Help needed moving to and from a bed to a chair (including a wheelchair)?: None Help needed standing up from a chair using your arms (e.g., wheelchair or bedside chair)?: None Help needed to walk in hospital room?: A Little Help needed climbing 3-5 steps with a railing? : A Little 6 Click Score: 22    End of Session   Activity Tolerance: Patient limited by pain;Patient limited by fatigue Patient  left: in chair;with call bell/phone within reach Nurse Communication: Mobility status;Patient requests pain meds PT Visit Diagnosis: Difficulty in walking, not elsewhere classified (R26.2);Pain;Muscle weakness (generalized) (M62.81) Pain - part of body: Ankle and joints of foot(abdomen)     Time: 1536-1550 PT Time Calculation (min) (ACUTE ONLY): 14 min  Charges:  $Gait Training: 8-22 mins                     Horald Chestnut, [place] 02/20/2020, 4:00 PM

## 2020-02-20 NOTE — Progress Notes (Signed)
Occupational Therapy Treatment Patient Details Name: Sarah Long MRN: [id number] DOB: 07/22/1971 Today's Date: 02/20/2020    History of present illness 49 y/o female w/ hx of obesity, HTN, HSV-2 infection, Herpes, dizzyness, DM, depression, bipolar 1 disorder, temp pacemaker (02/10/20), pacemake implant (02/13/20). Pt had presented to hospital with c/o 10/10 chest pain and elevated troponin, pt was dx with complete AV block, s/p new pacemaker (3/10)   OT comments  Patient seated in recliner and agreeable to OT session. Issued reacher, sock aide and reviewed use for LB ADLs; continues to require min assist to manage and recall use of equipment.  Patient completing toilet transfers and toileting with supervision, peri care hygiene with supervision standing at sink.  Reviewed pacemaker precautions for L UE.  Remains limited by decreased activity tolerance and endurance. Will follow acutely.    Follow Up Recommendations  Home health OT;Supervision - Intermittent    Equipment Recommendations  3 in 1 bedside commode;Tub/shower seat    Recommendations for Other Services      Precautions / Restrictions Precautions Precautions: Fall;ICD/Pacemaker Restrictions Weight Bearing Restrictions: Yes LUE Partial Weight Bearing Percentage or Pounds: pacemaker precautions       Mobility Bed Mobility               General bed mobility comments: OOB in recliner   Transfers Overall transfer level: Needs assistance   Transfers: Sit to/from Stand Sit to Stand: Supervision         General transfer comment: for safety, IV pole mgmt    Balance Overall balance assessment: Mild deficits observed, not formally tested                                         ADL either performed or assessed with clinical judgement   ADL Overall ADL's : Needs assistance/impaired     Grooming: Supervision/safety;Wash/dry hands;Standing       Lower Body Bathing: Administrator, [employer] Service;Sit  to/from stand Lower Body Bathing Details (indicate cue type and reason): simulated LB bathing using long sponge seated, standing for peri care with supervision      Lower Body Dressing: Minimal assistance;Sit to/from stand Lower Body Dressing Details (indicate cue type and reason): continues to require min assist for AE use of don/doff socks, supervision sit to stand  Toilet Transfer: Supervision/safety;Ambulation;Regular Toilet   Toileting- Water quality scientist and Hygiene: Supervision/safety;Sit to/from stand       Functional mobility during ADLs: Supervision/safety General ADL Comments: pt remains limited by decreased activity tolerance; reviewed pacemaker precautions     Vision       Perception     Praxis      Cognition Arousal/Alertness: Awake/alert Behavior During Therapy: WFL for tasks assessed/performed Overall Cognitive Status: Within Functional Limits for tasks assessed                                          Exercises     Shoulder Instructions       General Comments      Pertinent Vitals/ Pain       Pain Assessment: No/denies pain  Home Living  Prior Functioning/Environment              Frequency  Min 2X/week        Progress Toward Goals  OT Goals(current goals can now be found in the care plan section)  Progress towards OT goals: Progressing toward goals  Acute Rehab OT Goals Patient Stated Goal: to feel better OT Goal Formulation: With patient  Plan Discharge plan remains appropriate;Frequency remains appropriate    Co-evaluation                 AM-PAC OT "6 Clicks" Daily Activity     Outcome Measure   Help from another person eating meals?: None Help from another person taking care of personal grooming?: A Little Help from another person toileting, which includes using toliet, bedpan, or urinal?: A Little Help from another person bathing  (including washing, rinsing, drying)?: A Little Help from another person to put on and taking off regular upper body clothing?: A Little Help from another person to put on and taking off regular lower body clothing?: A Little 6 Click Score: 19    End of Session    OT Visit Diagnosis: Unsteadiness on feet (R26.81);Muscle weakness (generalized) (M62.81)   Activity Tolerance Patient tolerated treatment well   Patient Left in chair;with call bell/phone within reach   Nurse Communication Mobility status        Time: [id number] OT Time Calculation (min): 34 min  Charges: OT General Charges $OT Visit: 1 Visit OT Treatments $Self Care/Home Management : 23-37 mins  [place] Pager [phone] Office [place] 02/20/2020, 3:10 PM

## 2020-02-20 NOTE — Consult Note (Signed)
[facility] Consultation Note  Referring Provider: [facility] Primary Care Physician:  [name], MD  Reason for Consultation:  pancreatitis  HPI: Sarah Long is a 49 y.o. female whom we were asked to see for pancreatitis.  She has been in hospital about 10 days.  She was admitted with NSTEMI, chest pain, heart block and heart failure; one week ago, she had pacemaker and defibrillator placed.  She has improved from this.  She has some ongoing periumbilical and lower abdominal pain, somewhat worse after eating.  No vomiting.  No hematemesis, melena, hematochezia.  No prior history of pancreatitis.  Drinks alcohol intermittently and, at times, heavily.   Past Medical History:  Diagnosis Date  . Abnormal Pap smear 1998  . Abnormal vaginal bleeding 12/19/2011  . Bacterial infection   . Bipolar 1 disorder ([place])   . Blister of second toe of left foot 11/21/2016  . Candida vaginitis 07/2007  . Depression    recently added wellbutrin-has not taken yet for bipolar  . Diabetes in pregnancy   . Diabetes mellitus    nph 20U qam and qpm, regular with meals  . Fibroid   . Galactorrhea of right breast 2008  . H/O amenorrhea 07/2007  . H/O dizziness 10/14/11  . H/O dysmenorrhea 2010  . H/O menorrhagia 10/14/11  . H/O varicella   . Headache(784.0)   . Heavy vaginal bleeding due to contraceptive injection use 10/12/2011   Depo Provera  . Herpes   . HSV-2 infection 01/03/2009  . Hx: UTI (urinary tract infection) 2009  . Hypertension    on aldomet  . Increased BMI 2010  . Irregular uterine bleeding 04/04/2012   Pt has mirena   . Obesity 10/14/11  . Oligomenorrhea 07/2007  . Pelvic pain in female   . Preterm labor   . Trichomonas   . Yeast infection     Past Surgical History:  Procedure Laterality Date  . CESAREAN SECTION  1991  . LEFT HEART CATH AND CORONARY ANGIOGRAPHY N/A 02/10/2020   Procedure: LEFT HEART CATH AND CORONARY ANGIOGRAPHY;  Surgeon: Troy Sine, MD;  Location: [facility];  Service: Cardiovascular;  Laterality: N/A;  . PACEMAKER IMPLANT N/A 02/13/2020   Procedure: PACEMAKER IMPLANT;  Surgeon: Constance Haw, MD;  Location: [facility];  Service: Cardiovascular;  Laterality: N/A;  . TEMPORARY PACEMAKER N/A 02/10/2020   Procedure: TEMPORARY PACEMAKER;  Surgeon: Troy Sine, MD;  Location: [facility];  Service: Cardiovascular;  Laterality: N/A;    Prior to Admission medications   Medication Sig Start Date End Date Taking? Authorizing Provider  hydrOXYzine (ATARAX/VISTARIL) 25 MG tablet Take 25 mg by mouth 2 (two) times daily as needed for anxiety.   Yes [provider]  naproxen sodium (ALEVE) 220 MG tablet Take 220-440 mg by mouth 2 (two) times daily as needed (for back and muscle pain).   Yes [provider]  amLODipine (NORVASC) 10 MG tablet Take 1 tablet (10 mg total) by mouth daily. Patient not taking: Reported on 02/11/2020 10/02/19   [name], MD  aspirin EC 81 MG tablet Take 1 tablet (81 mg total) by mouth daily. Patient not taking: Reported on 02/11/2020 05/02/19   [name], MD  atorvastatin (LIPITOR) 40 MG tablet Take 1 tablet (40 mg total) by mouth daily. Patient not taking: Reported on 02/11/2020 10/02/19   [name], MD  blood glucose meter kit and supplies KIT Dispense based on  patient and insurance preference. Use up to four times daily as directed. (FOR ICD-9 250.00, 250.01). 11/14/18   Clent Demark, PA-C  Blood Glucose Monitoring Suppl (TRUE METRIX METER) w/Device KIT 1 each by Does not apply route as needed. 10/17/18   Clent Demark, PA-C  Cetirizine HCl 10 MG CAPS Take 1 capsule (10 mg total) by mouth daily for 10 days. Patient not taking: Reported on 02/11/2020 09/26/19 02/11/20  Wieters, Hallie C, PA-C  cloNIDine (CATAPRES) 0.1 MG tablet Take 1 tablet (0.1 mg total) by mouth 2 (two) times daily. Patient not taking: Reported on 02/11/2020 10/02/19   [name], MD  glucose blood (TRUE METRIX BLOOD GLUCOSE TEST) test strip 1 each by Other route every morning. 05/02/19   [name], MD  insulin aspart (NOVOLOG FLEXPEN) 100 UNIT/ML FlexPen Inject 8 Units into the skin 3 (three) times daily with meals. Patient not taking: Reported on 02/11/2020 10/02/19   [name], MD  Insulin Glargine [facility] KWIKPEN) 100 UNIT/ML SOPN Inject 0.8 mLs (80 Units total) into the skin daily. Patient not taking: Reported on 02/11/2020 07/10/19   [name], MD  Insulin Pen Needle (B-D ULTRAFINE III SHORT PEN) 31G X 8 MM MISC 1 application by Does not apply route daily. 05/02/19   [name], MD  Lancet Devices (ADJUSTABLE LANCING DEVICE) MISC Inject 1 each into the skin 3 (three) times daily. 10/17/18   Clent Demark, PA-C  Lancets 30G MISC 1 each by Does not apply route 3 (three) times daily. 05/02/19   [name], MD  losartan-hydrochlorothiazide (HYZAAR) 100-25 MG tablet Take 1 tablet by mouth daily. Patient not taking: Reported on 02/11/2020 10/02/19   [name], MD  meloxicam (MOBIC) 7.5 MG tablet Take 1 tablet (7.5 mg total) by mouth daily. During painful menses Patient not taking: Reported on 02/11/2020 10/02/19   [name], MD  triamcinolone ointment (KENALOG) 0.5 % Apply thin amount twice daily to area of itching Patient not taking: Reported on 02/11/2020 09/26/19   Janith Lima, PA-C    Current Facility-Administered Medications  Medication Dose Route Frequency Provider Last Rate Last Admin  . 0.9 %  sodium chloride infusion  250 mL Intravenous PRN Troy Sine, MD   Stopped at 02/13/20 0820  . 0.9 %  sodium chloride infusion   Intra-arterial PRN Scatliffe, Kristen D, MD      . 0.9 %  sodium chloride infusion   Intravenous PRN Noemi Chapel P, DO 10 mL/hr at 02/14/20 0043 250 mL at 02/14/20 0043  . acetaminophen (TYLENOL) tablet 325-650 mg  325-650 mg Oral Q4H PRN Constance Haw, MD   650 mg at 02/19/20 1640   . aspirin chewable tablet 81 mg  81 mg Oral Daily Wendee Beavers T, MD   81 mg at 02/20/20 0905  . Chlorhexidine Gluconate Cloth 2 % PADS 6 each  6 each Topical Daily Icard, Bradley L, DO   6 each at 02/20/20 0920  . cholecalciferol (VITAMIN D3) tablet 2,000 Units  2,000 Units Oral Daily Mercy Riding, MD   2,000 Units at 02/20/20 0905  . ciprofloxacin (CIPRO) IVPB 400 mg  400 mg Intravenous Q12H Einar Grad, RPH 200 mL/hr at 02/20/20 0918 400 mg at 02/20/20 0918  . dextrose 50 % solution 0-50 mL  0-50 mL Intravenous PRN Noemi Chapel P, DO   25 mL at 02/15/20 0617  . feeding supplement (BOOST / RESOURCE BREEZE)  liquid 1 Container  1 Container Oral TID BM Bonnell Public, MD   1 Container at 02/20/20 0920  . fentaNYL (SUBLIMAZE) injection 25 mcg  25 mcg Intravenous Q2H PRN Julian Hy, DO   25 mcg at 02/15/20 2124  . ferrous sulfate tablet 325 mg  325 mg Oral BID WC Wendee Beavers T, MD   325 mg at 02/20/20 0905  . folic acid (FOLVITE) tablet 1 mg  1 mg Oral Daily Wendee Beavers T, MD   1 mg at 02/20/20 0906  . heparin injection 5,000 Units  5,000 Units Subcutaneous Q8H Noemi Chapel P, DO   5,000 Units at 02/20/20 1409  . insulin aspart (novoLOG) injection 0-5 Units  0-5 Units Subcutaneous QHS Mercy Riding, MD   3 Units at 02/19/20 2138  . insulin aspart (novoLOG) injection 0-6 Units  0-6 Units Subcutaneous TID WC Mercy Riding, MD   4 Units at 02/20/20 1222  . lipase/protease/amylase (CREON) capsule 12,000 Units  12,000 Units Oral TID WC Florencia Reasons, MD      . magnesium sulfate IVPB 2 g 50 mL  2 g Intravenous Once Florencia Reasons, MD 50 mL/hr at 02/20/20 1430 2 g at 02/20/20 1430  . Melatonin TABS 3 mg  3 mg Oral QHS PRN Anders Simmonds, MD   3 mg at 02/17/20 2147  . metroNIDAZOLE (FLAGYL) IVPB 500 mg  500 mg Intravenous Q8H Dana Allan I, MD 100 mL/hr at 02/20/20 0913 500 mg at 02/20/20 0913  . nicotine (NICODERM CQ - dosed in mg/24 hours) patch 21 mg  21 mg Transdermal Daily Wendee Beavers T, MD    21 mg at 02/17/20 0912  . ondansetron (ZOFRAN) injection 4 mg  4 mg Intravenous Q6H PRN Noemi Chapel P, DO   4 mg at 02/15/20 1524  . pantoprazole (PROTONIX) EC tablet 40 mg  40 mg Oral Daily Wendee Beavers T, MD   40 mg at 02/20/20 0906  . saccharomyces boulardii (FLORASTOR) capsule 250 mg  250 mg Oral BID Wendee Beavers T, MD   250 mg at 02/20/20 0905  . simethicone (MYLICON) chewable tablet 80 mg  80 mg Oral QID PRN Mercy Riding, MD   80 mg at 02/18/20 2015  . sodium chloride flush (NS) 0.9 % injection 10-40 mL  10-40 mL Intracatheter Q12H Noemi Chapel P, DO   10 mL at 02/20/20 0920  . sodium chloride flush (NS) 0.9 % injection 10-40 mL  10-40 mL Intracatheter PRN Noemi Chapel P, DO      . sodium chloride flush (NS) 0.9 % injection 10-40 mL  10-40 mL Intracatheter PRN Gonfa, Taye T, MD      . sodium chloride flush (NS) 0.9 % injection 3 mL  3 mL Intravenous Q12H Troy Sine, MD   3 mL at 02/16/20 2118  . sodium chloride flush (NS) 0.9 % injection 3 mL  3 mL Intravenous PRN Troy Sine, MD   3 mL at 02/12/20 2101  . Vitamin D (Ergocalciferol) (DRISDOL) capsule 50,000 Units  50,000 Units Oral Q7 days Mercy Riding, MD   50,000 Units at 02/15/20 2138    Allergies as of 02/09/2020 - Review Complete 10/02/2019  Allergen Reaction Noted  . Strawberry extract Hives 07/21/2011  . Sulfa antibiotics Hives and Itching 06/18/2011    Family History  Problem Relation Age of Onset  . Hypertension Mother   . Diabetes Mother   . Heart disease Mother   . Hypertension  Father   . Diabetes Father   . Heart disease Father   . Stroke Maternal Grandfather   . Other Neg Hx     Social History   Socioeconomic History  . Marital status: Married    Spouse name: Not on file  . Number of children: Not on file  . Years of education: Not on file  . Highest education level: Not on file  Occupational History  . Not on file  Tobacco Use  . Smoking status: Current Every Day Smoker    Packs/day: 0.50     Years: 20.00    Pack years: 10.00    Types: Cigarettes  . Smokeless tobacco: Never Used  Substance and Sexual Activity  . Alcohol use: No  . Drug use: No  . Sexual activity: Yes    Birth control/protection: None  Other Topics Concern  . Not on file  Social History Narrative  . Not on file   Social Determinants of Health   Financial Resource Strain:   . Difficulty of Paying Living Expenses:   Food Insecurity:   . Worried About Charity fundraiser in the Last Year:   . Arboriculturist in the Last Year:   Transportation Needs:   . Film/video editor (Medical):   [name] Lack of Transportation (Non-Medical):   Physical Activity:   . Days of Exercise per Week:   . Minutes of Exercise per Session:   Stress:   . Feeling of Stress :   Social Connections:   . Frequency of Communication with Friends and Family:   . Frequency of Social Gatherings with Friends and Family:   . Attends Religious Services:   . Active Member of Clubs or Organizations:   . Attends Archivist Meetings:   [name] Marital Status:   Intimate Partner Violence:   . Fear of Current or Ex-Partner:   . Emotionally Abused:   [name] Physically Abused:   . Sexually Abused:     Review of Systems: As per HPI, all others negative  Physical Exam: Vital signs in last 24 hours: Temp:  [98.4 F (36.9 C)-100.1 F (37.8 C)] 98.5 F (36.9 C) (03/17 1440) Pulse Rate:  [97-104] 97 (03/17 1440) Resp:  [17-18] 18 (03/17 1440) BP: (115-151)/(65-83) 119/67 (03/17 1440) SpO2:  [96 %-100 %] 100 % (03/17 1440) Weight:  [101.7 kg] 101.7 kg (03/17 0609) Last BM Date: 02/19/20 General:   Alert,  Older-appearing than stated age, NAD Head:  Normocephalic and atraumatic. Eyes:  Sclera clear, no icterus.   Conjunctiva pink. Ears:  Normal auditory acuity. Nose:  No deformity, discharge,  or lesions. Mouth:  No deformity or lesions.  Oropharynx pink & moist. Neck:  Supple; no masses or thyromegaly. Lungs:  Clear throughout to  auscultation.   No wheezes, crackles, or rhonchi. No acute distress. Heart:  Regular rate and rhythm; no murmurs, clicks, rubs,  or gallops. Abdomen:  Soft, protuberant, nontender and nondistended. No masses, hepatosplenomegaly or hernias noted. Normal bowel sounds, without guarding, and without rebound.     Msk:  Symmetrical without gross deformities. Normal posture. Pulses:  Normal pulses noted. Extremities:  Without clubbing; 2+ edema bilaterally Neurologic:  Alert and  oriented x4;  grossly normal neurologically. Skin:  Intact without significant lesions or rashes. Psych:  Alert and cooperative. Normal mood and affect.   Lab Results: Recent Labs    02/18/20 0835 02/19/20 0237 02/20/20 0426  WBC 20.7* 21.6* 21.3*  HGB 9.3* 8.7* 8.8*  HCT  29.5* 27.1* 27.4*  PLT 213 209 217   BMET Recent Labs    02/18/20 0835 02/19/20 0237  NA 137 134*  K 3.9 3.6  CL 105 105  CO2 19* 20*  GLUCOSE 159* 229*  BUN 7 8  CREATININE 1.11* 1.00  CALCIUM 5.6* 5.9*   LFT Recent Labs    02/18/20 0835 02/18/20 0835 02/19/20 0237  PROT 6.6  --   --   ALBUMIN 1.7*   < > 1.6*  AST 33  --   --   ALT 66*  --   --   ALKPHOS 117  --   --   BILITOT 0.7  --   --    < > = values in this interval not displayed.   PT/INR No results for input(s): LABPROT, INR in the last 72 hours.  Studies/Results: CT ABDOMEN PELVIS W CONTRAST  Result Date: 02/19/2020 CLINICAL DATA:  Right upper quadrant pain, fever, elevated WBC, Murphy sign, acute pancreatitis with worsening leukocytosis EXAM: CT ABDOMEN AND PELVIS WITH CONTRAST TECHNIQUE: Multidetector CT imaging of the abdomen and pelvis was performed using the standard protocol following bolus administration of intravenous contrast. CONTRAST:  111m OMNIPAQUE IOHEXOL 300 MG/ML  SOLN COMPARISON:  02/15/2020 FINDINGS: Lower chest: Small bilateral pleural effusions and associated atelectasis or consolidation, unchanged compared to prior examination. Hepatobiliary:  No solid liver abnormality is seen. No gallstones, gallbladder wall thickening, or biliary dilatation. Pancreas: Diffuse fat stranding about the pancreas and adjacent retroperitoneum, slightly worsened compared to prior examination. There is no parenchymal hypodensity or focal fluid collection. Spleen: Normal in size without significant abnormality. Adrenals/Urinary Tract: Adrenal glands are unremarkable. Kidneys are normal, without renal calculi, solid lesion, or hydronephrosis. Bladder is unremarkable. Stomach/Bowel: Stomach is within normal limits. Appendix appears normal. No evidence of bowel wall thickening, distention, or inflammatory changes. Vascular/Lymphatic: No significant vascular findings are present. No enlarged abdominal or pelvic lymph nodes. Reproductive: No mass or other significant abnormality. Other: Anasarca. Unchanged small volume ascites. Musculoskeletal: No acute or significant osseous findings. IMPRESSION: 1. Diffuse fat stranding about the pancreas and adjacent retroperitoneum, slightly worsened compared to prior examination, consistent with worsened acute pancreatitis. No evidence of pancreatic necrosis or focal fluid collection. 2. Unchanged small bilateral pleural effusions and associated atelectasis or consolidation. 3. Unchanged small volume ascites and anasarca. Electronically Signed   By: AEddie CandleM.D.   On: 02/19/2020 16:21    Impression:  1.  Pancreatitis. Unclear etiology.  Seems the less pressing of all of her recent medical problems. 2.  Heart failure and heart block; had pacemaker and defibrillator placed last week.  Plan:  1.  Etiology of pancreatitis unclear, but could be alcohol-mediated as she reports drinking alcohol regularly and, at times, heavily. 2.  Supportive management of pancreatitis with IVF, judicious analgesics. 3.  Not MRCP candidate due to Pacemaker and Defibrillator. 4.  Check IgG-4, ANA, triglycerides, if not already done. 5.  Clear  liquids, advance slowly as tolerated. 6.  [facility] will follow.   LOS: 10 days   Lyly Canizales M  02/20/2020, 2:54 PM  Cell [phone] no answer or after 5 PM call [phone]

## 2020-02-20 NOTE — Progress Notes (Signed)
Inpatient Diabetes Program Recommendations  AACE/ADA: New Consensus Statement on Inpatient Glycemic Control (2015)  Target Ranges:  Prepandial:   less than 140 mg/dL      Peak postprandial:   less than 180 mg/dL (1-2 hours)      Critically ill patients:  140 - 180 mg/dL   Lab Results  Component Value Date   GLUCAP 215 (H) 02/20/2020   HGBA1C 12.7 (H) 02/11/2020    Review of Glycemic Control Results for AHMIYAH, KOSKELA (MRN [id number]) as of 02/18/2020 09:24  Ref. Range 02/17/2020 07:47 02/17/2020 11:54 02/17/2020 17:48 02/17/2020 21:49  Glucose-Capillary Latest Ref Range: 70 - 99 mg/dL 141 (H) 202 (H) 275 (H) 312 (H)  Results for MYNDEE, CREQUE (MRN [id number]) as of 02/20/2020 09:42  Ref. Range 02/19/2020 08:08 02/19/2020 12:09 02/19/2020 16:31 02/19/2020 21:30 02/20/2020 07:49  Glucose-Capillary Latest Ref Range: 70 - 99 mg/dL 201 (H) 268 (H) 261 (H) 293 (H) 215 (H)   Diabetes history:DM2 Outpatient Diabetes medications:Novolog 8 units TID with meals + Basaglar 80 units QD (pt reports no insulin in 3 months) Current orders for Inpatient glycemic control:  Novolog 0-6 units tid + hs  Ensure Enlive tid between meals  Inpatient Diabetes Program Recommendations:    Glucose trends increase after meal intake. Noted Calorie count.  Pt may benefit from   - Lantus 5 units  - Novolog 3 units tid if consuming at least 50% of meals/supplement.  Thanks, Tama Headings RN, MSN, BC-ADM Inpatient Diabetes Coordinator Team Pager [phone] (8a-5p)

## 2020-02-20 NOTE — Progress Notes (Signed)
PROGRESS NOTE  Sarah Long XYB:[id number] DOB: 1971/03/13 DOA: 02/09/2020 PCP: [name], MD  Brief Narrative / Interim history: 49 yr old obese F w/ significant PMHx alcohol abuse, tobacco abuse, IDDM, HTN, HLD and morbid obesity presented to [facility] with 10/10 chest pain. Per the triage note pt reported vomiting all day, being short of breath and having severe chest pain feeling like she was going to pass out. She denied loss of consciousness or syncopal episode.  Her initial Vitals were 133/80mHg HR 41 RR 26 O2 sat 94% .  CBG>6013mdl.  Bicarb obtained.  AG 30. Na 131.  K5.3. Cr 2.85.  Lipase 1879 AST 582.  ALT 294. EKG showed an Idioventricular rhythm with LAD, AVB, LVH and an inferior infarct (QRS 162 ms and QT/QTc 574/467 ms). Troponin 730>> 2000.   Patient went into complete AVB with HR of 39 while in ED.  Started on external pacer.  Done she becomes somnolent after IV Versed requiring intubation.  Cardiology consulted and she was admitted to ICU.  She was also started on Unasyn for possible aspiration pneumonia.  Patient underwent LHC on 3/7 which was negative for CAD.  She had temporary venous pacemaker advanced at the same time.  Echo with EF of 40 to 45%, global hypokinesis, severe LVH and indeterminate diastolic function but no other significant finding.  She eventually had permanent PPM/ICD implant on 02/13/2020.  Patient was extubated on 02/11/2020, and has been vomiting.  Patient also with acute uncomplicated pancreatitis, rhabdomyolysis, significant hypocalcemia and vitamin D deficiency, transaminitis and iron deficiency anemia.   CT abdomen and pelvis on 3/7 and 3/12 showed acute uncomplicated pancreatitis with some inflammatory changes of the colon and small bowel but no abscess or pseudocyst.   02/17/2020: Patient seen alongside patient's nurse.  No new complaints.  Calcium remains low (5) with albumin of 1.7, likely complication of acute pancreatitis.  Lipase is  297, and CPK is 843.  Serum creatinine is 1.  Potassium is 4.1.  02/18/2020: Calcium is 5.6 today, magnesium is 1.5, phosphorus is 1.6 and CPK is 515.  Continue to monitor and replete normal electrolytes IV.  Discharge back home once replacement can be done safely orally.  No new complaints today.  02/19/2020: Patient seen.  Worsening leukocytosis is noted.  We will proceed with further work-up.  We will get repeat CT scan of the abdomen and pelvis with IV contrast.  Patient reported watery diarrhea.  Will send stool for analysis (fecal leukocyte, ova parasite, gastrointestinal panel, stool culture).  Diarrhea presentation is not suggestive of C. difficile, but cannot entirely rule it.  Hypocalcemia persists, hypophosphatemia persists, hypomagnesemia persists..  Current diarrheal illness will worsen electrolyte problems.  Patient will remain inpatient for now.      HPI/Recap of past 24 hours:  Continues to complain of abdominal pain, no vomiting T-max 100.1, WBC 21.3 C/o being edematous  Assessment/Plan: Active Problems:   Complete heart block (HCC)   Diabetic ketoacidosis without coma associated with type 2 diabetes mellitus (HCC)   Elevated troponin   NSTEMI (non-ST elevated myocardial infarction) (HCC)   Lactic acid acidosis   Endotracheally intubated   ACS (acute coronary syndrome) ([place]  Acute pancreatitis without infection or necrosis   Heart block AV complete (HCSan Miguel Persistent pancreatitis Repeat CT scan with worsening of pancreatitis, she is started on Cipro and Flagyl yesterday decrease diet from regular diet to full liquid diet for now Start pancrelipase supplement Case discussed with [facility]  Dr. Paulita Fujita who will see patient in consult  Edematous/ fluid overload 1 dose of Lasix for now   Non-STEMI: presented with severe chest pain Complete Heart Block/bifascicular block (RBBB and LAFB)  -s/p PPM and ICD on 02/13/2020.  Device interrogation normal. -Cardiology  following.  New systolic CHF: Echo on 3/7 with LVEF of 40 to 45%, global hypokinesis, severe LVH. Acute hypoxic respiratory failure required intubation initially Now on room air  Uncontrolled DM-2 with hypoglycemia:  Presented with DKA that has resolved.  A1c 12.7%.   with hypoglycemia and hyperglycemia C-peptide and insulin level within normal range. Continue adjust insulin  AKI Multifactorial including cardiorenal, rhabdo and iatrogenic from ARB/HCTZ and NSAIDs Resolved  hypokalemia/hypomagnesemia/hypophosphatemia -Continue to monitor and replete accordingly.  Hypocalcemia/vitamin D deficiency: Calcium 4.8.  Multifactorial including vitamin D deficiency (< 4.2), rhabdo and acute pancreatitis.  PTH is appropriately high.  Urine calcium appropriately low at 4.3. -Calcium gluconate 3 g -P.o. vitamin D 50,000 IU weekly and 2000 daily-suspect poor observation in the setting of acute pancreatitis  Rhabdomyolysis: Due to alcohol?  Statin?  CK 855> 954> 994.  CK-MB low. -IV fluid as above-increased rate. -Hold statin -CK improving  Alcohol use disorder:  Reportedly drinks about 5-6 drinks including liquors -Counseled. -Consult TOC for resources  Tobacco use disorder: Smokes about a pack a day. -Cessation counseling -Nicotine patch  Body mass index is 38.48 kg/m.   DVT Prophylaxis: Heparin subcu  Code Status: Full  Family Communication: patient   Disposition Plan:    Patient came from:                    Home                                                                                       Anticipated d/c place:  Home  Barriers to d/c OR conditions which need to be met to effect a safe d/c:  Not medically ready   Consultants:  [facility]  Critical care  Procedures:  ETT 3/7-3/8 LHC/temporary internal venous pacer on 3/7 PPM/ICD placement on 3/10 2D echo on 3/7-LVEF 40 to 45%, global hypokinesis. Severe LVH  Antibiotics:  Cipro and  Flagyl   Objective: BP 115/65 (BP Location: Left Arm)   Pulse (!) 104   Temp 100.1 F (37.8 C) (Oral)   Resp 17   Ht 5' 4" (1.626 m)   Wt 101.7 kg   LMP 02/07/2020 Comment: negative preg test  SpO2 97%   BMI 38.48 kg/m   Intake/Output Summary (Last 24 hours) at 02/20/2020 1151 Last data filed at 02/20/2020 0932 Gross per 24 hour  Intake 1271.45 ml  Output --  Net 1271.45 ml   Filed Weights   02/18/20 0433 02/19/20 0506 02/20/20 0609  Weight: 103.1 kg 103 kg 101.7 kg    Exam: Patient is examined daily including today on 02/20/2020, exams remain the same as of yesterday except that has changed    General:  NAD  Cardiovascular: RRR  Respiratory: CTABL  Abdomen: Diffuse mild abdominal tenderness , no rebound , positive BS  Musculoskeletal: Diffuse edema edema  Neuro: alert, oriented  Data Reviewed: Basic Metabolic Panel: Recent Labs  Lab 02/15/20 0728 02/15/20 1143 02/16/20 0247 02/17/20 0412 02/18/20 0835 02/19/20 0237  NA 135  --  134* 136 137 134*  K 3.8  --  3.0* 4.1 3.9 3.6  CL 107  --  103 106 105 105  CO2 18*  --  19* 19* 19* 20*  GLUCOSE 91  --  74 154* 159* 229*  BUN 8  --  _0 CREATININE 1.25*  --  1.31* 1.00 1.11* 1.00  CALCIUM 4.4* 4.3 4.8* 5.0* 5.6* 5.9*  MG  --  1.5* 1.4* 1.8 1.5* 1.5*  PHOS 1.4*  --  2.2* 1.3* 1.6* 1.9*   Liver Function Tests: Recent Labs  Lab 02/14/20 1350 02/14/20 1350 02/15/20 0728 02/16/20 0247 02/17/20 0412 02/18/20 0835 02/19/20 0237  AST 243*  --  143* 90* 50* 33  --   ALT 337*  --  215* 154* 100* 66*  --   ALKPHOS 103  --  107 145* 124 117  --   BILITOT 0.6  --  0.7 0.7 0.7 0.7  --   PROT 5.8*  --  5.9* 6.3* 6.2* 6.6  --   ALBUMIN 1.9*   < > 1.9*  1.9* 1.8*  1.8* 1.7*  1.7* 1.7* 1.6*   < > = values in this interval not displayed.   Recent Labs  Lab 02/14/20 0416 02/15/20 1143 02/16/20 0247 02/17/20 0412  LIPASE 941* 349* 348* 297*   No results for input(s): AMMONIA in the last 168  hours. CBC: Recent Labs  Lab 02/16/20 0247 02/17/20 0412 02/18/20 0835 02/19/20 0237 02/20/20 0426  WBC 11.4* 14.2* 20.7* 21.6* 21.3*  NEUTROABS  --   --  17.3*  --  18.1*  HGB 8.8* 9.5* 9.3* 8.7* 8.8*  HCT 27.3* 29.6* 29.5* 27.1* 27.4*  MCV 92.9 93.1 92.2 90.6 91.6  PLT 188 181 213 209 217   Cardiac Enzymes:   Recent Labs  Lab 02/15/20 0728 02/15/20 1442 02/16/20 0247 02/17/20 0412 02/18/20 0835  CKTOTAL 954* 931* 994* 843* 515*  CKMB  --  4.9  --   --   --    BNP (last 3 results) No results for input(s): BNP in the last 8760 hours.  ProBNP (last 3 results) No results for input(s): PROBNP in the last 8760 hours.  CBG: Recent Labs  Lab 02/19/20 1209 02/19/20 1631 02/19/20 2130 02/20/20 0749 02/20/20 1140  GLUCAP 268* 261* 293* 215* 313*    Recent Results (from the past 240 hour(s))  Culture, blood (routine x 2)     Status: None   Collection Time: 02/10/20  9:26 PM   Specimen: BLOOD RIGHT HAND  Result Value Ref Range Status   Specimen Description BLOOD RIGHT HAND  Final   Special Requests   Final    BOTTLES DRAWN AEROBIC ONLY Blood Culture results may not be optimal due to an inadequate volume of blood received in culture bottles   Culture   Final    NO GROWTH 5 DAYS Performed at [facility], [place] [address] [place], [place] [address]    Report Status 02/15/2020 FINAL  Final  Culture, blood (routine x 2)     Status: None   Collection Time: 02/10/20  9:27 PM   Specimen: BLOOD  Result Value Ref Range Status   Specimen Description BLOOD RIGHT ANTECUBITAL  Final   Special Requests   Final    BOTTLES DRAWN AEROBIC ONLY Blood  Culture adequate volume   Culture   Final    NO GROWTH 5 DAYS Performed at [facility], [place] [address], [place], [place] [address]    Report Status 02/15/2020 FINAL  Final  Surgical PCR screen     Status: None   Collection Time: 02/13/20  1:11 AM   Specimen: Nasal Mucosa; Nasal Swab  Result Value Ref Range Status    MRSA, PCR NEGATIVE NEGATIVE Final   Staphylococcus aureus NEGATIVE NEGATIVE Final    Comment: (NOTE) The Xpert SA Assay (FDA approved for NASAL specimens in patients 75 years of age and older), is one component of a comprehensive surveillance program. It is not intended to diagnose infection nor to guide or monitor treatment. Performed at [facility], [place] [address], [place], [place] [address]      Studies: CT ABDOMEN PELVIS W CONTRAST  Result Date: 02/19/2020 CLINICAL DATA:  Right upper quadrant pain, fever, elevated WBC, Murphy sign, acute pancreatitis with worsening leukocytosis EXAM: CT ABDOMEN AND PELVIS WITH CONTRAST TECHNIQUE: Multidetector CT imaging of the abdomen and pelvis was performed using the standard protocol following bolus administration of intravenous contrast. CONTRAST:  141m OMNIPAQUE IOHEXOL 300 MG/ML  SOLN COMPARISON:  02/15/2020 FINDINGS: Lower chest: Small bilateral pleural effusions and associated atelectasis or consolidation, unchanged compared to prior examination. Hepatobiliary: No solid liver abnormality is seen. No gallstones, gallbladder wall thickening, or biliary dilatation. Pancreas: Diffuse fat stranding about the pancreas and adjacent retroperitoneum, slightly worsened compared to prior examination. There is no parenchymal hypodensity or focal fluid collection. Spleen: Normal in size without significant abnormality. Adrenals/Urinary Tract: Adrenal glands are unremarkable. Kidneys are normal, without renal calculi, solid lesion, or hydronephrosis. Bladder is unremarkable. Stomach/Bowel: Stomach is within normal limits. Appendix appears normal. No evidence of bowel wall thickening, distention, or inflammatory changes. Vascular/Lymphatic: No significant vascular findings are present. No enlarged abdominal or pelvic lymph nodes. Reproductive: No mass or other significant abnormality. Other: Anasarca. Unchanged small volume ascites. Musculoskeletal: No acute or  significant osseous findings. IMPRESSION: 1. Diffuse fat stranding about the pancreas and adjacent retroperitoneum, slightly worsened compared to prior examination, consistent with worsened acute pancreatitis. No evidence of pancreatic necrosis or focal fluid collection. 2. Unchanged small bilateral pleural effusions and associated atelectasis or consolidation. 3. Unchanged small volume ascites and anasarca. Electronically Signed   By: AEddie CandleM.D.   On: 02/19/2020 16:21    Scheduled Meds: . aspirin  81 mg Oral Daily  . Chlorhexidine Gluconate Cloth  6 each Topical Daily  . cholecalciferol  2,000 Units Oral Daily  . feeding supplement  1 Container Oral TID BM  . ferrous sulfate  325 mg Oral BID WC  . folic acid  1 mg Oral Daily  . heparin  5,000 Units Subcutaneous Q8H  . insulin aspart  0-5 Units Subcutaneous QHS  . insulin aspart  0-6 Units Subcutaneous TID WC  . nicotine  21 mg Transdermal Daily  . pantoprazole  40 mg Oral Daily  . saccharomyces boulardii  250 mg Oral BID  . sodium chloride flush  10-40 mL Intracatheter Q12H  . sodium chloride flush  3 mL Intravenous Q12H  . Vitamin D (Ergocalciferol)  50,000 Units Oral Q7 days    Continuous Infusions: . sodium chloride Stopped (02/13/20 0820)  . sodium chloride    . sodium chloride 250 mL (02/14/20 0043)  . ciprofloxacin 400 mg (02/20/20 0918)  . metronidazole 500 mg (02/20/20 0913)     Time spent: 381ms  I have personally reviewed and interpreted on  02/20/2020 daily labs, tele strips, imagings as discussed above under date review session and assessment and plans.  I reviewed all nursing notes, pharmacy notes, consultant notes,  vitals, pertinent old records  I have discussed plan of care as described above with RN , patient  on 02/20/2020   Florencia Reasons MD, PhD, FACP  [facility]  Available via Epic secure chat 7am-7pm for nonurgent issues Please page for urgent issues, pager number available through [email or web address]  .   02/20/2020, 11:51 AM  LOS: 10 days

## 2020-02-21 LAB — CBC WITH DIFFERENTIAL/PLATELET
Abs Immature Granulocytes: 0.3 10*3/uL — ABNORMAL HIGH (ref 0.00–0.07)
Basophils Absolute: 0 10*3/uL (ref 0.0–0.1)
Basophils Relative: 0 %
Eosinophils Absolute: 0.1 10*3/uL (ref 0.0–0.5)
Eosinophils Relative: 1 %
HCT: 26.6 % — ABNORMAL LOW (ref 36.0–46.0)
Hemoglobin: 8.5 g/dL — ABNORMAL LOW (ref 12.0–15.0)
Immature Granulocytes: 2 %
Lymphocytes Relative: 9 %
Lymphs Abs: 1.4 10*3/uL (ref 0.7–4.0)
MCH: 29.5 pg (ref 26.0–34.0)
MCHC: 32 g/dL (ref 30.0–36.0)
MCV: 92.4 fL (ref 80.0–100.0)
Monocytes Absolute: 1.1 10*3/uL — ABNORMAL HIGH (ref 0.1–1.0)
Monocytes Relative: 7 %
Neutro Abs: 13.1 10*3/uL — ABNORMAL HIGH (ref 1.7–7.7)
Neutrophils Relative %: 81 %
Platelets: 197 10*3/uL (ref 150–400)
RBC: 2.88 MIL/uL — ABNORMAL LOW (ref 3.87–5.11)
RDW: 15.2 % (ref 11.5–15.5)
WBC: 16 10*3/uL — ABNORMAL HIGH (ref 4.0–10.5)
nRBC: 0 % (ref 0.0–0.2)

## 2020-02-21 LAB — COMPREHENSIVE METABOLIC PANEL
ALT: 29 U/L (ref 0–44)
AST: 22 U/L (ref 15–41)
Albumin: 1.5 g/dL — ABNORMAL LOW (ref 3.5–5.0)
Alkaline Phosphatase: 112 U/L (ref 38–126)
Anion gap: 9 (ref 5–15)
BUN: 8 mg/dL (ref 6–20)
CO2: 20 mmol/L — ABNORMAL LOW (ref 22–32)
Calcium: 7.1 mg/dL — ABNORMAL LOW (ref 8.9–10.3)
Chloride: 104 mmol/L (ref 98–111)
Creatinine, Ser: 0.98 mg/dL (ref 0.44–1.00)
GFR calc Af Amer: >60 mL/min (ref >60)
GFR calc non Af Amer: >60 mL/min (ref >60)
Glucose, Bld: 300 mg/dL — ABNORMAL HIGH (ref 70–99)
Potassium: 3.9 mmol/L (ref 3.5–5.1)
Sodium: 133 mmol/L — ABNORMAL LOW (ref 135–145)
Total Bilirubin: 0.5 mg/dL (ref 0.3–1.2)
Total Protein: 6.1 g/dL — ABNORMAL LOW (ref 6.5–8.1)

## 2020-02-21 LAB — GLUCOSE, CAPILLARY
Glucose-Capillary: 220 mg/dL — ABNORMAL HIGH (ref 70–99)
Glucose-Capillary: 287 mg/dL — ABNORMAL HIGH (ref 70–99)
Glucose-Capillary: 346 mg/dL — ABNORMAL HIGH (ref 70–99)
Glucose-Capillary: 315 mg/dL — ABNORMAL HIGH (ref 70–99)

## 2020-02-21 LAB — MAGNESIUM: Magnesium: 1.7 mg/dL (ref 1.7–2.4)

## 2020-02-21 LAB — LACTOFERRIN, FECAL, QUALITATIVE: Lactoferrin, Fecal, Qual: NEGATIVE

## 2020-02-21 LAB — TRIGLYCERIDES: Triglycerides: 69 mg/dL (ref <150)

## 2020-02-21 LAB — LIPASE, BLOOD: Lipase: 208 U/L — ABNORMAL HIGH (ref 11–51)

## 2020-02-21 MED ORDER — INSULIN GLARGINE 100 UNIT/ML ~~LOC~~ SOLN
20.0000 [IU] | Freq: Every day | SUBCUTANEOUS | Status: DC
  Administered 2020-02-21 – 2020-02-22 (×2): 20 [IU] via SUBCUTANEOUS
  Filled 2020-02-21 (×2): qty 0.2

## 2020-02-21 MED ORDER — FUROSEMIDE 10 MG/ML IJ SOLN
40.0000 mg | Freq: Once | INTRAMUSCULAR | Status: AC
  Administered 2020-02-21: 40 mg via INTRAVENOUS
  Filled 2020-02-21: qty 4

## 2020-02-21 MED ORDER — MAGNESIUM SULFATE 2 GM/50ML IV SOLN
2.0000 g | Freq: Once | INTRAVENOUS | Status: AC
  Administered 2020-02-21: 2 g via INTRAVENOUS
  Filled 2020-02-21: qty 50

## 2020-02-21 MED ORDER — BOOST / RESOURCE BREEZE PO LIQD CUSTOM
1.0000 | Freq: Four times a day (QID) | ORAL | Status: DC
  Administered 2020-02-22 – 2020-02-25 (×11): 1 via ORAL

## 2020-02-21 MED ORDER — SODIUM CHLORIDE 0.9 % IV SOLN
2.0000 g | INTRAVENOUS | Status: DC
  Administered 2020-02-21 – 2020-02-25 (×5): 2 g via INTRAVENOUS
  Filled 2020-02-21 (×3): qty 2
  Filled 2020-02-21: qty 20
  Filled 2020-02-21: qty 2
  Filled 2020-02-21: qty 20

## 2020-02-21 NOTE — Progress Notes (Signed)
Results for JORDI, GUTERMUTH (MRN [id number]) as of 02/21/2020 11:19  Ref. Range 02/20/2020 11:40 02/20/2020 17:08 02/20/2020 18:56 02/20/2020 21:10 02/21/2020 07:59  Glucose-Capillary Latest Ref Range: 70 - 99 mg/dL 313 (H) 326 (H) 335 (H) 363 (H) 220 (H)  Noted that blood sugars have been greater than 200 mg/dl. Patient has started back on diet and getting nutritional supplements.  Recommend starting Lantus 20 units daily (weight based: 101.2 kg X 0.2 units/kg) and continuing Novolog correction scale as ordered. Titrate dosages as needed.   Will continue to monitor blood sugars while in the hospital.  Harvel Ricks RN BSN CDE Diabetes Coordinator Pager: [phone]  8am-5pm

## 2020-02-21 NOTE — Progress Notes (Addendum)
PROGRESS NOTE  Sarah Long JJH:[id number] DOB: Jul 11, 1971 DOA: 02/09/2020 PCP: [name], MD  Brief Narrative / Interim history: 49 yr old obese F w/ significant PMHx alcohol abuse, tobacco abuse, IDDM, HTN, HLD and morbid obesity presented to [facility] with 10/10 chest pain. Per the triage note pt reported vomiting all day, being short of breath and having severe chest pain feeling like she was going to pass out. She denied loss of consciousness or syncopal episode.  Her initial Vitals were 133/58mHg HR 41 RR 26 O2 sat 94% .  CBG>6092mdl.  Bicarb obtained.  AG 30. Na 131.  K5.3. Cr 2.85.  Lipase 1879 AST 582.  ALT 294. EKG showed an Idioventricular rhythm with LAD, AVB, LVH and an inferior infarct (QRS 162 ms and QT/QTc 574/467 ms). Troponin 730>> 2000.   Patient went into complete AVB with HR of 39 while in ED.  Started on external pacer.  Done she becomes somnolent after IV Versed requiring intubation.  Cardiology consulted and she was admitted to ICU.  She was also started on Unasyn for possible aspiration pneumonia.  Patient underwent LHC on 3/7 which was negative for CAD.  She had temporary venous pacemaker advanced at the same time.  Echo with EF of 40 to 45%, global hypokinesis, severe LVH and indeterminate diastolic function but no other significant finding.  She eventually had permanent PPM/ICD implant on 02/13/2020.  Patient was extubated on 02/11/2020, and has been vomiting.  Patient also with acute uncomplicated pancreatitis, rhabdomyolysis, significant hypocalcemia and vitamin D deficiency, transaminitis and iron deficiency anemia.   CT abdomen and pelvis on 3/7 and 3/12 showed acute uncomplicated pancreatitis with some inflammatory changes of the colon and small bowel but no abscess or pseudocyst.   02/17/2020: Patient seen alongside patient's nurse.  No new complaints.  Calcium remains low (5) with albumin of 1.7, likely complication of acute pancreatitis.  Lipase is  297, and CPK is 843.  Serum creatinine is 1.  Potassium is 4.1.  02/18/2020: Calcium is 5.6 today, magnesium is 1.5, phosphorus is 1.6 and CPK is 515.  Continue to monitor and replete normal electrolytes IV.  Discharge back home once replacement can be done safely orally.  No new complaints today.  02/19/2020: Patient seen.  Worsening leukocytosis is noted.  We will proceed with further work-up.  We will get repeat CT scan of the abdomen and pelvis with IV contrast.  Patient reported watery diarrhea.  Will send stool for analysis (fecal leukocyte, ova parasite, gastrointestinal panel, stool culture).  Diarrhea presentation is not suggestive of C. difficile, but cannot entirely rule it.  Hypocalcemia persists, hypophosphatemia persists, hypomagnesemia persists..  Current diarrheal illness will worsen electrolyte problems.  Patient will remain inpatient for now.      HPI/Recap of past 24 hours:  C/o feeling tight/uneasy feeling upper abdomen and back /bandlike after oral intake C/o right lower abdominal pain,   Continue to have frequent watery Diarrhea,  no vomiting T-max99.1, WBC 16 C/o being edematous  Assessment/Plan: Active Problems:   Complete heart block (HCC)   Diabetic ketoacidosis without coma associated with type 2 diabetes mellitus (HCC)   Elevated troponin   NSTEMI (non-ST elevated myocardial infarction) (HCC)   Lactic acid acidosis   Endotracheally intubated   ACS (acute coronary syndrome) (HCC)   Acute pancreatitis without infection or necrosis   Heart block AV complete ([facility] Persistent pancreatitis/ab pain/watery diarrhea Leukocytosis got worse on 3/16 with persistent diarrhea , a Repeat CT scan  done on 3/16 showed worsening of pancreatitis  she is started on Cipro and Flagyl on 3/16 decrease diet from regular diet to full liquid diet for now Start pancrelipase supplement [facility] Dr. Paulita Fujita consulted, input appreciated  Edematous/ fluid overload  +18.7 L since  admission per documentation S/p 1 dose of Lasix on 3/17, remain edematous, will give another dose iv lasix 43m today  Non-STEMI: presented with severe chest pain Complete Heart Block/bifascicular block (RBBB and LAFB)  -s/p PPM and ICD on 02/13/2020.  Device interrogation normal. -Cardiology following.  New systolic CHF: Echo on 3/7 with LVEF of 40 to 45%, global hypokinesis, severe LVH. Acute hypoxic respiratory failure required intubation initially Now on room air  Uncontrolled insulin dependent DM-2 with hypoglycemia:  Presented with DKA that has resolved.  A1c 12.7%.   with hypoglycemia and hyperglycemia C-peptide and insulin level within normal range. Continue adjust insulin  AKI Multifactorial including cardiorenal, rhabdo and iatrogenic from ARB/HCTZ and NSAIDs Resolved  hypokalemia/hypomagnesemia/hypophosphatemia -Continue to monitor and replete accordingly.  QTc prolongation Keep on telemetry, keep mag above 2, k above 4 Avoid QT prolonging agent, change Cipro to Rocephin  Hypocalcemia/vitamin D deficiency: Calcium 4.8.  Multifactorial including vitamin D deficiency (< 4.2), rhabdo and acute pancreatitis.  PTH is appropriately high.  Urine calcium appropriately low at 4.3. -Calcium gluconate 3 g -P.o. vitamin D 50,000 IU weekly and 2000 daily-suspect poor observation in the setting of acute pancreatitis  Rhabdomyolysis: Due to alcohol?  Statin?  CK 855> 954> 994.  CK-MB low. -IV fluid as above-increased rate. -Hold statin -CK improving  Alcohol use disorder:  Reportedly drinks about 5-6 drinks including liquors -Counseled. -Consult TOC for resources  Tobacco use disorder: Smokes about a pack a day. -Cessation counseling -Nicotine patch  Body mass index is 38.3 kg/m.   DVT Prophylaxis: Heparin subcu  Code Status: Full  Family Communication: patient   Disposition Plan:    Patient came from:                    Home                                                                                        Anticipated d/c place:  Home with home health  Barriers to d/c OR conditions which need to be met to effect a safe d/c:  Not medically ready, pancreatitis worsening Need gi clearance   [facility]  [facility]  Critical care  Procedures:  ETT 3/7-3/8 LHC/temporary internal venous pacer on 3/7 PPM/ICD placement on 3/10 2D echo on 3/7-LVEF 40 to 45%, global hypokinesis. Severe LVH  Antibiotics:  Cipro and Flagyl then rocephin/flagyl   Objective: BP 122/64 (BP Location: Left Arm)   Pulse 86   Temp 99 F (37.2 C) (Oral)   Resp 18   Ht _0  (1.626 m)   Wt 101.2 kg   LMP 02/07/2020 Comment: negative preg test  SpO2 100%   BMI 38.30 kg/m   Intake/Output Summary (Last 24 hours) at 02/21/2020 1225 Last data filed at 02/21/2020 0845 Gross per 24 hour  Intake 1661.84 ml  Output 1000 ml  Net 661.84 ml   Filed Weights   02/19/20 0506 02/20/20 0609 02/21/20 0626  Weight: 103 kg 101.7 kg 101.2 kg    Exam: Patient is examined daily including today on 02/21/2020, exams remain the same as of yesterday except that has changed    General:  NAD  Cardiovascular: RRR  Respiratory: CTABL  Abdomen: protuberance, tight, right lower quadrant tenderness with guarding , no rebound , positive BS  Musculoskeletal: Diffuse edema   Neuro: alert, oriented   Data Reviewed: Basic Metabolic Panel: Recent Labs  Lab 02/15/20 0728 02/15/20 1143 02/16/20 0247 02/17/20 0412 02/18/20 0835 02/19/20 0237 02/21/20 0331  NA 135   < > 134* 136 137 134* 133*  K 3.8   < > 3.0* 4.1 3.9 3.6 3.9  CL 107   < > 103 106 105 105 104  CO2 18*   < > 19* 19* 19* 20* 20*  GLUCOSE 91   < > 74 154* 159* 229* 300*  BUN 8   < > _0 CREATININE 1.25*   < > 1.31* 1.00 1.11* 1.00 0.98  CALCIUM 4.4*   < > 4.8* 5.0* 5.6* 5.9* 7.1*  MG  --    < > 1.4* 1.8 1.5* 1.5* 1.7  PHOS 1.4*  --  2.2* 1.3* 1.6* 1.9*  --    < > = values in this interval  not displayed.   Liver Function Tests: Recent Labs  Lab 02/15/20 0728 02/15/20 0728 02/16/20 0247 02/17/20 0412 02/18/20 0835 02/19/20 0237 02/21/20 0331  AST 143*  --  90* 50* 33  --  22  ALT 215*  --  154* 100* 66*  --  29  ALKPHOS 107  --  145* 124 117  --  112  BILITOT 0.7  --  0.7 0.7 0.7  --  0.5  PROT 5.9*  --  6.3* 6.2* 6.6  --  6.1*  ALBUMIN 1.9*  1.9*   < > 1.8*  1.8* 1.7*  1.7* 1.7* 1.6* 1.5*   < > = values in this interval not displayed.   Recent Labs  Lab 02/15/20 1143 02/16/20 0247 02/17/20 0412 02/21/20 0331  LIPASE 349* 348* 297* 208*   No results for input(s): AMMONIA in the last 168 hours. CBC: Recent Labs  Lab 02/17/20 0412 02/18/20 0835 02/19/20 0237 02/20/20 0426 02/21/20 0331  WBC 14.2* 20.7* 21.6* 21.3* 16.0*  NEUTROABS  --  17.3*  --  18.1* 13.1*  HGB 9.5* 9.3* 8.7* 8.8* 8.5*  HCT 29.6* 29.5* 27.1* 27.4* 26.6*  MCV 93.1 92.2 90.6 91.6 92.4  PLT 181 213 209 217 197   Cardiac Enzymes:   Recent Labs  Lab 02/15/20 0728 02/15/20 1442 02/16/20 0247 02/17/20 0412 02/18/20 0835  CKTOTAL 954* 931* 994* 843* 515*  CKMB  --  4.9  --   --   --    BNP (last 3 results) No results for input(s): BNP in the last 8760 hours.  ProBNP (last 3 results) No results for input(s): PROBNP in the last 8760 hours.  CBG: Recent Labs  Lab 02/20/20 1708 02/20/20 1856 02/20/20 2110 02/21/20 0759 02/21/20 1221  GLUCAP 326* 335* 363* 220* 287*    Recent Results (from the past 240 hour(s))  Surgical PCR screen     Status: None   Collection Time: 02/13/20  1:11 AM   Specimen: Nasal Mucosa; Nasal Swab  Result Value Ref Range Status   MRSA, PCR NEGATIVE NEGATIVE Final   Staphylococcus  aureus NEGATIVE NEGATIVE Final    Comment: (NOTE) The Xpert SA Assay (FDA approved for NASAL specimens in patients 57 years of age and older), is one component of a comprehensive surveillance program. It is not intended to diagnose infection nor to guide or monitor  treatment. Performed at [facility], [place] [address], [place], [place] [address]      Studies: No results found.  Scheduled Meds: . aspirin  81 mg Oral Daily  . Chlorhexidine Gluconate Cloth  6 each Topical Daily  . cholecalciferol  2,000 Units Oral Daily  . feeding supplement  1 Container Oral TID BM  . ferrous sulfate  325 mg Oral BID WC  . folic acid  1 mg Oral Daily  . heparin  5,000 Units Subcutaneous Q8H  . insulin aspart  0-5 Units Subcutaneous QHS  . insulin aspart  0-6 Units Subcutaneous TID WC  . lipase/protease/amylase  12,000 Units Oral TID WC  . nicotine  21 mg Transdermal Daily  . pantoprazole  40 mg Oral Daily  . saccharomyces boulardii  250 mg Oral BID  . sodium chloride flush  10-40 mL Intracatheter Q12H  . sodium chloride flush  3 mL Intravenous Q12H  . Vitamin D (Ergocalciferol)  50,000 Units Oral Q7 days    Continuous Infusions: . sodium chloride Stopped (02/13/20 0820)  . sodium chloride    . sodium chloride 250 mL (02/14/20 0043)  . cefTRIAXone (ROCEPHIN)  IV    . magnesium sulfate bolus IVPB    . metronidazole 500 mg (02/21/20 7125)     Time spent: 70mns I have personally reviewed and interpreted on  02/21/2020 daily labs, tele strips, imagings as discussed above under date review session and assessment and plans.  I reviewed all nursing notes, pharmacy notes, consultant notes,  vitals, pertinent old records  I have discussed plan of care as described above with RN , patient  on 02/21/2020   FFlorencia ReasonsMD, PhD, FACP  [facility]  Available via Epic secure chat 7am-7pm for nonurgent issues Please page for urgent issues, pager number available through [email or web address] .   02/21/2020, 12:25 PM  LOS: 11 days

## 2020-02-21 NOTE — Progress Notes (Signed)
Subjective: Ongoing abdominal pain (about same), worse after eating. Having some diarrhea.  Objective: Vital signs in last 24 hours: Temp:  [98.4 F (36.9 C)-99.1 F (37.3 C)] 99 F (37.2 C) (03/18 1153) Pulse Rate:  [86-97] 86 (03/18 1153) Resp:  [18] 18 (03/18 1153) BP: (119-136)/(63-74) 122/64 (03/18 1153) SpO2:  [96 %-100 %] 100 % (03/18 1153) Weight:  [101.2 kg] 101.2 kg (03/18 0626) Weight change: -0.496 kg Last BM Date: 02/20/20  PE: GEN:  NAD, older-appearing than stated age ABD:  Soft, protuberant.  Lab Results: CBC    Component Value Date/Time   WBC 16.0 (H) 02/21/2020 0331   RBC 2.88 (L) 02/21/2020 0331   HGB 8.5 (L) 02/21/2020 0331   HGB 13.2 03/17/2017 0000   HCT 26.6 (L) 02/21/2020 0331   HCT 40.5 03/17/2017 0000   PLT 197 02/21/2020 0331   PLT 290 03/17/2017 0000   MCV 92.4 02/21/2020 0331   MCV 95 03/17/2017 0000   MCH 29.5 02/21/2020 0331   MCHC 32.0 02/21/2020 0331   RDW 15.2 02/21/2020 0331   RDW 13.9 03/17/2017 0000   LYMPHSABS 1.4 02/21/2020 0331   LYMPHSABS 2.1 03/17/2017 0000   MONOABS 1.1 (H) 02/21/2020 0331   EOSABS 0.1 02/21/2020 0331   EOSABS 0.1 03/17/2017 0000   BASOSABS 0.0 02/21/2020 0331   BASOSABS 0.0 03/17/2017 0000   CMP     Component Value Date/Time   NA 133 (L) 02/21/2020 0331   NA 133 (L) 07/10/2019 1531   K 3.9 02/21/2020 0331   CL 104 02/21/2020 0331   CO2 20 (L) 02/21/2020 0331   GLUCOSE 300 (H) 02/21/2020 0331   BUN 8 02/21/2020 0331   BUN 13 07/10/2019 1531   CREATININE 0.98 02/21/2020 0331   CREATININE 0.67 10/21/2016 1630   CALCIUM 7.1 (L) 02/21/2020 0331   CALCIUM 4.3 02/15/2020 1143   PROT 6.1 (L) 02/21/2020 0331   PROT 7.0 10/17/2018 1649   ALBUMIN 1.5 (L) 02/21/2020 0331   ALBUMIN 3.7 10/17/2018 1649   AST 22 02/21/2020 0331   ALT 29 02/21/2020 0331   ALKPHOS 112 02/21/2020 0331   BILITOT 0.5 02/21/2020 0331   BILITOT <0.2 10/17/2018 1649   GFRNONAA >60 02/21/2020 0331   GFRNONAA >89 10/21/2016  1630   GFRAA >60 02/21/2020 0331   GFRAA >89 10/21/2016 1630   Assessment:  1.  Pancreatitis. Unclear etiology.  Seems the less pressing of all of her recent medical problems. 2.  Diarrhea. 3.  Heart failure and heart block; had pacemaker and defibrillator placed last week.  Plan:  1.  Start pancreatic enzymes. 2.  Stool studies have been ordered. 3.  Diet as tolerated. 4.  I told patient if she continued not to be able to eat without having pain, over the course of the next few days, we would have to consider a nasoenteric feeding tube. 5.  [facility] will follow.   Landry Dyke 02/21/2020, 2:05 PM   Cell [phone] If no answer or after 5 PM call [phone]

## 2020-02-21 NOTE — Progress Notes (Signed)
Nutrition Follow-up  DOCUMENTATION CODES:   Obesity unspecified  INTERVENTION:  Boost Breeze po QID, each supplement provides 250 kcal and 9 grams of protein  RD will monitor for diet advancement and order additional supplements as appropriate.  NUTRITION DIAGNOSIS:   Inadequate oral intake related to poor appetite as evidenced by per patient/family report.  Ongoing.  GOAL:   Patient will meet greater than or equal to 90% of their needs  Not met.  MONITOR:   PO intake, Supplement acceptance, Weight trends, Labs, I & O's  REASON FOR ASSESSMENT:   Consult Assessment of nutrition requirement/status  ASSESSMENT:   Pt with a PMH significant for EtOH abuse, tobacco abuse, IDDM, HTN, HLD who presented with chest pain, vomiting, and shortness of breath. Pt went into complete heart block then became somnolent post versed requiring intubation.  3/7 intubated, left heart cath, TVP placed 3/8 extubated 3/10 s/p PPM and ICD 3/17 pt on full liquid diet   Pt with pancreatitis diagnosis.   Pt reports feeling hungry and wanting "real" food again. Pt enjoying Boost Breeze, but only the orange flavor. Pt does not tolerate Ensure well; reports it causing GI upset.  Per MD, pt complaining of abdominal pain after eating. Pt states pain is no different when comparing regular diet to full liquid diet. MD informed pt that NG tube will be necessary if post prandial abdominal pain does not resolve. [employer] service available on Mondays, Wednesdays and Fridays.  PO Intake: 40-100% x last 8 recorded meals (68% average intake)  Medications reviewed and include: Vitamin D3, Boost Breeze TID, Ferrous sulfate, Folvite, Lasix, SSI, Lantus, Creon, Florastor, Dirsdol, flagyl, IV abx  Labs reviewed: Na 133 (L) CBGs 220-  UOP: 1,052m x24 hours I/O: 19,239.856msince admit  Diet Order:   Diet Order            Diet full liquid Room service appropriate? Yes; Fluid consistency: Thin  Diet effective  now              EDUCATION NEEDS:   No education needs have been identified at this time  Skin:  Skin Assessment: Skin Integrity Issues: Skin Integrity Issues:: Incisions Incisions: chest, abdomen  Last BM:  3/17  Height:   Ht Readings from Last 1 Encounters:  02/10/20 '5\' 4"'$  (1.626 m)    Weight:   Wt Readings from Last 1 Encounters:  02/21/20 101.2 kg   BMI:  Body mass index is 38.3 kg/m.  Estimated Nutritional Needs:   Kcal:  1900-2100  Protein:  115-130 grams  Fluid:  >/= 1.9L   AmLarkin InaMS, RD, LDN RD pager number and weekend/on-call pager number located in [facility]

## 2020-02-22 LAB — COMPREHENSIVE METABOLIC PANEL
ALT: 24 U/L (ref 0–44)
AST: 23 U/L (ref 15–41)
Albumin: 1.5 g/dL — ABNORMAL LOW (ref 3.5–5.0)
Alkaline Phosphatase: 122 U/L (ref 38–126)
Anion gap: 10 (ref 5–15)
BUN: 7 mg/dL (ref 6–20)
CO2: 20 mmol/L — ABNORMAL LOW (ref 22–32)
Calcium: 7.4 mg/dL — ABNORMAL LOW (ref 8.9–10.3)
Chloride: 103 mmol/L (ref 98–111)
Creatinine, Ser: 0.9 mg/dL (ref 0.44–1.00)
GFR calc Af Amer: >60 mL/min (ref >60)
GFR calc non Af Amer: >60 mL/min (ref >60)
Glucose, Bld: 246 mg/dL — ABNORMAL HIGH (ref 70–99)
Potassium: 3.4 mmol/L — ABNORMAL LOW (ref 3.5–5.1)
Sodium: 133 mmol/L — ABNORMAL LOW (ref 135–145)
Total Bilirubin: 0.6 mg/dL (ref 0.3–1.2)
Total Protein: 5.9 g/dL — ABNORMAL LOW (ref 6.5–8.1)

## 2020-02-22 LAB — CBC WITH DIFFERENTIAL/PLATELET
Abs Immature Granulocytes: 0.19 10*3/uL — ABNORMAL HIGH (ref 0.00–0.07)
Basophils Absolute: 0 10*3/uL (ref 0.0–0.1)
Basophils Relative: 0 %
Eosinophils Absolute: 0.1 10*3/uL (ref 0.0–0.5)
Eosinophils Relative: 1 %
HCT: 26.3 % — ABNORMAL LOW (ref 36.0–46.0)
Hemoglobin: 8.3 g/dL — ABNORMAL LOW (ref 12.0–15.0)
Immature Granulocytes: 1 %
Lymphocytes Relative: 8 %
Lymphs Abs: 1.2 10*3/uL (ref 0.7–4.0)
MCH: 29.6 pg (ref 26.0–34.0)
MCHC: 31.6 g/dL (ref 30.0–36.0)
MCV: 93.9 fL (ref 80.0–100.0)
Monocytes Absolute: 1.1 10*3/uL — ABNORMAL HIGH (ref 0.1–1.0)
Monocytes Relative: 7 %
Neutro Abs: 13.4 10*3/uL — ABNORMAL HIGH (ref 1.7–7.7)
Neutrophils Relative %: 83 %
Platelets: 254 10*3/uL (ref 150–400)
RBC: 2.8 MIL/uL — ABNORMAL LOW (ref 3.87–5.11)
RDW: 15.1 % (ref 11.5–15.5)
WBC: 16.1 10*3/uL — ABNORMAL HIGH (ref 4.0–10.5)
nRBC: 0.1 % (ref 0.0–0.2)

## 2020-02-22 LAB — PROTIME-INR
INR: 1.4 — ABNORMAL HIGH (ref 0.8–1.2)
Prothrombin Time: 16.9 seconds — ABNORMAL HIGH (ref 11.4–15.2)

## 2020-02-22 LAB — ANTINUCLEAR ANTIBODIES, IFA: ANA Ab, IFA: NEGATIVE

## 2020-02-22 LAB — C DIFFICILE QUICK SCREEN W PCR REFLEX
C Diff antigen: NEGATIVE
C Diff interpretation: NOT DETECTED
C Diff toxin: NEGATIVE

## 2020-02-22 LAB — GLUCOSE, CAPILLARY
Glucose-Capillary: 226 mg/dL — ABNORMAL HIGH (ref 70–99)
Glucose-Capillary: 344 mg/dL — ABNORMAL HIGH (ref 70–99)
Glucose-Capillary: 308 mg/dL — ABNORMAL HIGH (ref 70–99)
Glucose-Capillary: 297 mg/dL — ABNORMAL HIGH (ref 70–99)

## 2020-02-22 LAB — MAGNESIUM: Magnesium: 1.6 mg/dL — ABNORMAL LOW (ref 1.7–2.4)

## 2020-02-22 LAB — CK: Total CK: 51 U/L (ref 38–234)

## 2020-02-22 LAB — IGG 4: IgG, Subclass 4: 31 mg/dL (ref 2–96)

## 2020-02-22 LAB — LIPASE, BLOOD: Lipase: 158 U/L — ABNORMAL HIGH (ref 11–51)

## 2020-02-22 MED ORDER — INSULIN GLARGINE 100 UNIT/ML ~~LOC~~ SOLN
25.0000 [IU] | Freq: Every day | SUBCUTANEOUS | Status: DC
  Filled 2020-02-22: qty 0.25

## 2020-02-22 MED ORDER — FUROSEMIDE 10 MG/ML IJ SOLN
40.0000 mg | Freq: Once | INTRAMUSCULAR | Status: AC
  Administered 2020-02-22: 40 mg via INTRAVENOUS
  Filled 2020-02-22: qty 4

## 2020-02-22 MED ORDER — MAGNESIUM SULFATE 4 GM/100ML IV SOLN
4.0000 g | Freq: Once | INTRAVENOUS | Status: AC
  Administered 2020-02-22: 4 g via INTRAVENOUS
  Filled 2020-02-22: qty 100

## 2020-02-22 MED ORDER — INSULIN ASPART 100 UNIT/ML ~~LOC~~ SOLN
3.0000 [IU] | Freq: Three times a day (TID) | SUBCUTANEOUS | Status: DC
  Administered 2020-02-22 – 2020-02-26 (×11): 3 [IU] via SUBCUTANEOUS

## 2020-02-22 MED ORDER — ENOXAPARIN SODIUM 40 MG/0.4ML ~~LOC~~ SOLN: 40.0000 mg | SUBCUTANEOUS | Status: DC

## 2020-02-22 MED ORDER — ENOXAPARIN SODIUM 40 MG/0.4ML ~~LOC~~ SOLN
40.0000 mg | SUBCUTANEOUS | Status: DC
  Administered 2020-02-23 – 2020-02-26 (×4): 40 mg via SUBCUTANEOUS
  Filled 2020-02-22 (×4): qty 0.4

## 2020-02-22 MED ORDER — SODIUM BICARBONATE 650 MG PO TABS
650.0000 mg | ORAL_TABLET | Freq: Three times a day (TID) | ORAL | Status: DC
  Administered 2020-02-22 – 2020-02-25 (×10): 650 mg via ORAL
  Filled 2020-02-22 (×10): qty 1

## 2020-02-22 MED ORDER — POTASSIUM CHLORIDE CRYS ER 20 MEQ PO TBCR
40.0000 meq | EXTENDED_RELEASE_TABLET | Freq: Once | ORAL | Status: AC
  Administered 2020-02-22: 40 meq via ORAL
  Filled 2020-02-22: qty 2

## 2020-02-22 MED ORDER — PANCRELIPASE (LIP-PROT-AMYL) 12000-38000 UNITS PO CPEP
24000.0000 [IU] | ORAL_CAPSULE | Freq: Three times a day (TID) | ORAL | Status: DC
  Administered 2020-02-22 – 2020-02-26 (×12): 24000 [IU] via ORAL
  Filled 2020-02-22 (×13): qty 2

## 2020-02-22 NOTE — Progress Notes (Signed)
Subjective: Ongoing abdominal pain (about same) and diarrhea.  Objective: Vital signs in last 24 hours: Temp:  [98.3 F (36.8 C)-99.3 F (37.4 C)] 99.3 F (37.4 C) (03/19 0415) Pulse Rate:  [92-98] 93 (03/19 0415) Resp:  [18] 18 (03/19 0415) BP: (111-151)/(59-75) 111/59 (03/19 0415) SpO2:  [100 %] 100 % (03/19 0415) Weight:  [99.8 kg] 99.8 kg (03/19 0415) Weight change: -1.4 kg Last BM Date: 02/21/20  PE: GEN:  NAD, older-appearing than stated age  Lab Results: CBC    Component Value Date/Time   WBC 16.1 (H) 02/22/2020 0255   RBC 2.80 (L) 02/22/2020 0255   HGB 8.3 (L) 02/22/2020 0255   HGB 13.2 03/17/2017 0000   HCT 26.3 (L) 02/22/2020 0255   HCT 40.5 03/17/2017 0000   PLT 254 02/22/2020 0255   PLT 290 03/17/2017 0000   MCV 93.9 02/22/2020 0255   MCV 95 03/17/2017 0000   MCH 29.6 02/22/2020 0255   MCHC 31.6 02/22/2020 0255   RDW 15.1 02/22/2020 0255   RDW 13.9 03/17/2017 0000   LYMPHSABS 1.2 02/22/2020 0255   LYMPHSABS 2.1 03/17/2017 0000   MONOABS 1.1 (H) 02/22/2020 0255   EOSABS 0.1 02/22/2020 0255   EOSABS 0.1 03/17/2017 0000   BASOSABS 0.0 02/22/2020 0255   BASOSABS 0.0 03/17/2017 0000   CMP     Component Value Date/Time   NA 133 (L) 02/22/2020 0255   NA 133 (L) 07/10/2019 1531   K 3.4 (L) 02/22/2020 0255   CL 103 02/22/2020 0255   CO2 20 (L) 02/22/2020 0255   GLUCOSE 246 (H) 02/22/2020 0255   BUN 7 02/22/2020 0255   BUN 13 07/10/2019 1531   CREATININE 0.90 02/22/2020 0255   CREATININE 0.67 10/21/2016 1630   CALCIUM 7.4 (L) 02/22/2020 0255   CALCIUM 4.3 02/15/2020 1143   PROT 5.9 (L) 02/22/2020 0255   PROT 7.0 10/17/2018 1649   ALBUMIN 1.5 (L) 02/22/2020 0255   ALBUMIN 3.7 10/17/2018 1649   AST 23 02/22/2020 0255   ALT 24 02/22/2020 0255   ALKPHOS 122 02/22/2020 0255   BILITOT 0.6 02/22/2020 0255   BILITOT <0.2 10/17/2018 1649   GFRNONAA >60 02/22/2020 0255   GFRNONAA >89 10/21/2016 1630   GFRAA >60 02/22/2020 0255   GFRAA >89 10/21/2016  1630   Assessment:  1. Pancreatitis. Unclear etiology. Seems the less pressing of all of her recent medical problems. 2. Diarrhea. 3.  Heart failure and heart block; had pacemaker and defibrillator placed last week.  Plan:  1.  Increase pancreatic enzyme dosing. 2.  Add probiotics. 3.  Further stool studies in process. 4.  If stool negative C. Diff or other infections, could consider low dose dicyclomine (10 mg po bid) to see if that helps some of her diarrhea and abdominal pain. 5.  [facility] will revisit Monday.  If still having issues with post-prandial abdominal pain and diarrhea early next week, will need to consider short-term nasoenteric feeding tube placement.   Landry Dyke 02/22/2020, 1:00 PM   Cell [phone] If no answer or after 5 PM call [phone]

## 2020-02-22 NOTE — Progress Notes (Signed)
Occupational Therapy Treatment Patient Details Name: Sarah Long MRN: [id number] DOB: 1971/09/28 Today's Date: 02/22/2020    History of present illness 49 y/o female w/ hx of obesity, HTN, HSV-2 infection, Herpes, dizzyness, DM, depression, bipolar 1 disorder, temp pacemaker (02/10/20), pacemake implant (02/13/20). Pt had presented to hospital with c/o 10/10 chest pain and elevated troponin, pt was dx with complete AV block, s/p new pacemaker (3/10)   OT comments  OT entered the room as Pt was washing up sitting at sink. After bathing complete, OT reviewed and re-educated/practiced AE for LB dressing. PT improving this session. Then OT educated on energy conservation. Pt verbalized understanding but will need reinforcement and would benefit from [facility] handout. Pt continues to progress towards OT goals and will benefit from continued OT in the acute setting as well as post-acute at the [facility] level.    Follow Up Recommendations  Home health OT;Supervision - Intermittent    Equipment Recommendations  3 in 1 bedside commode;Tub/shower seat;Other (comment)(AE (already issued))    Recommendations for Other Services      Precautions / Restrictions Precautions Precautions: Fall;ICD/Pacemaker;Other (comment)(enteric) Precaution Comments: min fall Restrictions Weight Bearing Restrictions: Yes LUE Weight Bearing: Partial weight bearing LUE Partial Weight Bearing Percentage or Pounds: pacemaker precautions       Mobility Bed Mobility               General bed mobility comments: OOB in recliner   Transfers Overall transfer level: Needs assistance Equipment used: None Transfers: Sit to/from [facility] Sit to Stand: Supervision;Modified independent (Device/Increase time) Stand pivot transfers: Supervision;Modified independent (Device/Increase time)       General transfer comment: not attached to any lines this session    Balance Overall balance assessment: No  apparent balance deficits (not formally assessed)                                         ADL either performed or assessed with clinical judgement   ADL Overall ADL's : Needs assistance/impaired     Grooming: Oral care;Wash/dry face;Supervision/safety;Standing Grooming Details (indicate cue type and reason): sink level     Lower Body Bathing: Supervison/ safety;Sit to/from stand Lower Body Bathing Details (indicate cue type and reason): simulated LB bathing using long sponge seated, standing for peri care with supervision      Lower Body Dressing: Cueing for sequencing;With adaptive equipment;Sit to/from stand;Set up Lower Body Dressing Details (indicate cue type and reason): practice using AE for LB dressing - socks and manipulating grabber Toilet Transfer: Supervision/safety;Ambulation;Regular Toilet   Toileting- Water quality scientist and Hygiene: Supervision/safety;Sit to/from stand         General ADL Comments: focus on AE education as well as energy conservation with relation to ADL     Vision       Perception     Praxis      Cognition Arousal/Alertness: Awake/alert Behavior During Therapy: WFL for tasks assessed/performed Overall Cognitive Status: Within Functional Limits for tasks assessed                                          Exercises     Shoulder Instructions       General Comments      Pertinent Vitals/ Pain       Pain  Assessment: Faces Faces Pain Scale: Hurts a little bit Pain Location: abdomen Pain Descriptors / Indicators: Guarding;Grimacing Pain Intervention(s): Limited activity within patient's tolerance;Monitored during session;Other (comment)(AE for LB ADL)  Home Living                                          Prior Functioning/Environment              Frequency  Min 2X/week        Progress Toward Goals  OT Goals(current goals can now be found in the care plan  section)  Progress towards OT goals: Progressing toward goals  Acute Rehab OT Goals Patient Stated Goal: get stronger and go home to family OT Goal Formulation: With patient Time For Goal Achievement: 02/28/20 Potential to Achieve Goals: Good  Plan Discharge plan remains appropriate;Frequency remains appropriate    Co-evaluation                 AM-PAC OT "6 Clicks" Daily Activity     Outcome Measure   Help from another person eating meals?: None Help from another person taking care of personal grooming?: A Little Help from another person toileting, which includes using toliet, bedpan, or urinal?: A Little Help from another person bathing (including washing, rinsing, drying)?: A Little Help from another person to put on and taking off regular upper body clothing?: A Little Help from another person to put on and taking off regular lower body clothing?: A Little 6 Click Score: 19    End of Session    OT Visit Diagnosis: Unsteadiness on feet (R26.81);Muscle weakness (generalized) (M62.81)   Activity Tolerance Patient tolerated treatment well   Patient Left with call bell/phone within reach;in bed   Nurse Communication Mobility status        Time: [id number] OT Time Calculation (min): 19 min  Charges: OT General Charges $OT Visit: 1 Visit OT Treatments $Self Care/Home Management : 8-22 mins  Jesse Sans OTR/L Acute Rehabilitation Services Pager: [phone] Office: [place] 02/22/2020, 12:53 PM

## 2020-02-22 NOTE — Progress Notes (Signed)
PROGRESS NOTE  Sarah Long [id number] DOB: 1971-06-08 DOA: 02/09/2020 PCP: [name], MD  Brief Narrative / Interim history: 49 yr old obese F w/ significant PMHx alcohol abuse, tobacco abuse, IDDM, HTN, HLD and morbid obesity presented to [facility] with 10/10 chest pain. Per the triage note pt reported vomiting all day, being short of breath and having severe chest pain feeling like she was going to pass out. She denied loss of consciousness or syncopal episode.  Her initial Vitals were 133/1mHg HR 41 RR 26 O2 sat 94% .  CBG>'600mg'$ /dl.  Bicarb obtained.  AG 30. Na 131.  K5.3. Cr 2.85.  Lipase 1879 AST 582.  ALT 294. EKG showed an Idioventricular rhythm with LAD, AVB, LVH and an inferior infarct (QRS 162 ms and QT/QTc 574/467 ms). Troponin 730>> 2000.   Patient went into complete AVB with HR of 39 while in ED.  Started on external pacer.  Done she becomes somnolent after IV Versed requiring intubation.  Cardiology consulted and she was admitted to ICU.  She was also started on Unasyn for possible aspiration pneumonia.  Patient underwent LHC on 3/7 which was negative for CAD.  She had temporary venous pacemaker advanced at the same time.  Echo with EF of 40 to 45%, global hypokinesis, severe LVH and indeterminate diastolic function but no other significant finding.  She eventually had permanent PPM/ICD implant on 02/13/2020.  Patient was extubated on 02/11/2020, and has been vomiting.  Patient also with acute uncomplicated pancreatitis, rhabdomyolysis, significant hypocalcemia and vitamin D deficiency, transaminitis and iron deficiency anemia.   CT abdomen and pelvis on 3/7 and 3/12 showed acute uncomplicated pancreatitis with some inflammatory changes of the colon and small bowel but no abscess or pseudocyst.      HPI/Recap of past 24 hours:  Tolerating full liquid, wound diet advanced to soft diet today  C/o right lower abdominal pain, Continue to have frequent watery  Diarrhea, but report volume decreased no vomiting T-max99.3, WBC 16 C/o being edematous  Assessment/Plan: Active Problems:   Complete heart block (HCC)   Diabetic ketoacidosis without coma associated with type 2 diabetes mellitus (HCC)   Elevated troponin   NSTEMI (non-ST elevated myocardial infarction) (HCC)   Lactic acid acidosis   Endotracheally intubated   ACS (acute coronary syndrome) (HCC)   Acute pancreatitis without infection or necrosis   Heart block AV complete (HAlex  Persistent pancreatitis/ab pain/watery diarrhea Leukocytosis got worse on 3/16 with persistent diarrhea , a Repeat CT scan done on 3/16 showed worsening of pancreatitis  she is started on Cipro and Flagyl on 3/16 Started pancrelipase supplement [facility] Dr. OPaulita Fujitaconsulted, input appreciated -Report tolerating full liquid diet on the change diet to soft today,   Edematous/ fluid overload  +18.7 L since admission per documentation S/p 1 dose of Lasix on 3/17, x1 on 3/18, remain edematous, will give another dose iv lasix '40mg'$  today Encourage ambulation Nutrition supplement  Non-STEMI: presented with severe chest pain Complete Heart Block/bifascicular block (RBBB and LAFB)  -s/p PPM and ICD on 02/13/2020.  Device interrogation normal. -Cardiology following.  New systolic CHF: Echo on 3/7 with LVEF of 40 to 45%, global hypokinesis, severe LVH. Acute hypoxic respiratory failure required intubation initially Now on room air  Uncontrolled insulin dependent DM-2 with hypoglycemia:  Presented with DKA that has resolved.  A1c 12.7%.   with hypoglycemia and hyperglycemia C-peptide and insulin level within normal range. Continue adjust insulin  AKI Multifactorial including cardiorenal, rhabdo and  iatrogenic from ARB/HCTZ and NSAIDs Resolved  hypokalemia/hypomagnesemia/hypophosphatemia -Continue to monitor and replete accordingly.  QTc prolongation Keep on telemetry, keep mag above 2, k above 4 Avoid  QT prolonging agent, change Cipro to Rocephin  Hypocalcemia/vitamin D deficiency: Calcium 4.8.  Multifactorial including vitamin D deficiency (< 4.2), rhabdo and acute pancreatitis.  PTH is appropriately high.  Urine calcium appropriately low at 4.3. -Calcium gluconate 3 g -P.o. vitamin D 50,000 IU weekly and 2000 daily-suspect poor observation in the setting of acute pancreatitis  Rhabdomyolysis: Due to alcohol?  Statin?  CK 855> 954> 994.  CK-MB low. -IV fluid as above-increased rate. -Hold statin -CK normalized  Alcohol use disorder:  Reportedly drinks about 5-6 drinks including liquors -Counseled. -Consult TOC for resources  Tobacco use disorder: Smokes about a pack a day. -Cessation counseling -Nicotine patch  Body mass index is 37.77 kg/m.   DVT Prophylaxis: Heparin subcu  Code Status: Full  Family Communication: patient   Disposition Plan:    Patient came from:                    Home                                                                                       Anticipated d/c place:  Home with home health  Barriers to d/c OR conditions which need to be met to effect a safe d/c:  Home once able to tolerate diet advancement, diarrhea improves,Need gi clearance   Consultants:  [facility]iology  Critical care  Procedures:  ETT 3/7-3/8 LHC/temporary internal venous pacer on 3/7 PPM/ICD placement on 3/10 2D echo on 3/7-LVEF 40 to 45%, global hypokinesis. Severe LVH  Antibiotics:  Cipro and Flagyl then rocephin/flagyl   Objective: BP (!) 111/59 (BP Location: Left Arm)   Pulse 93   Temp 99.3 F (37.4 C) (Oral)   Resp 18   Ht '5\' 4"'$  (1.626 m)   Wt 99.8 kg   LMP 02/07/2020 Comment: negative preg test  SpO2 100%   BMI 37.77 kg/m   Intake/Output Summary (Last 24 hours) at 02/22/2020 1340 Last data filed at 02/22/2020 0500 Gross per 24 hour  Intake 802.57 ml  Output 800 ml  Net 2.57 ml   Filed Weights   02/20/20 0609 02/21/20 0626  02/22/20 0415  Weight: 101.7 kg 101.2 kg 99.8 kg    Exam: Patient is examined daily including today on 02/22/2020, exams remain the same as of yesterday except that has changed    General:  NAD  Cardiovascular: Paced rhythm  Respiratory: CTABL  Abdomen: protuberance, tight, right lower quadrant tenderness with guarding , no rebound , positive BS  Musculoskeletal: Diffuse edema   Neuro: alert, oriented   Data Reviewed: Basic Metabolic Panel: Recent Labs  Lab 02/16/20 0247 02/16/20 0247 02/17/20 0412 02/18/20 0835 02/19/20 0237 02/21/20 0331 02/22/20 0255  NA 134*   < > 136 137 134* 133* 133*  K 3.0*   < > 4.1 3.9 3.6 3.9 3.4*  CL 103   < > 106 105 105 104 103  CO2 19*   < > 19* 19* 20* 20* 20*  GLUCOSE 74   < > 154* 159* 229* 300* 246*  BUN 7   < > '8 7 8 8 7  '$ CREATININE 1.31*   < > 1.00 1.11* 1.00 0.98 0.90  CALCIUM 4.8*   < > 5.0* 5.6* 5.9* 7.1* 7.4*  MG 1.4*   < > 1.8 1.5* 1.5* 1.7 1.6*  PHOS 2.2*  --  1.3* 1.6* 1.9*  --   --    < > = values in this interval not displayed.   Liver Function Tests: Recent Labs  Lab 02/16/20 0247 02/16/20 0247 02/17/20 0412 02/18/20 0835 02/19/20 0237 02/21/20 0331 02/22/20 0255  AST 90*  --  50* 33  --  22 23  ALT 154*  --  100* 66*  --  29 24  ALKPHOS 145*  --  124 117  --  112 122  BILITOT 0.7  --  0.7 0.7  --  0.5 0.6  PROT 6.3*  --  6.2* 6.6  --  6.1* 5.9*  ALBUMIN 1.8*  1.8*   < > 1.7*  1.7* 1.7* 1.6* 1.5* 1.5*   < > = values in this interval not displayed.   Recent Labs  Lab 02/16/20 0247 02/17/20 0412 02/21/20 0331 02/22/20 0255  LIPASE 348* 297* 208* 158*   No results for input(s): AMMONIA in the last 168 hours. CBC: Recent Labs  Lab 02/18/20 0835 02/19/20 0237 02/20/20 0426 02/21/20 0331 02/22/20 0255  WBC 20.7* 21.6* 21.3* 16.0* 16.1*  NEUTROABS 17.3*  --  18.1* 13.1* 13.4*  HGB 9.3* 8.7* 8.8* 8.5* 8.3*  HCT 29.5* 27.1* 27.4* 26.6* 26.3*  MCV 92.2 90.6 91.6 92.4 93.9  PLT 213 209 217 197 254    Cardiac Enzymes:   Recent Labs  Lab 02/15/20 1442 02/16/20 0247 02/17/20 0412 02/18/20 0835 02/22/20 0255  CKTOTAL 931* 994* 843* 515* 51  CKMB 4.9  --   --   --   --    BNP (last 3 results) No results for input(s): BNP in the last 8760 hours.  ProBNP (last 3 results) No results for input(s): PROBNP in the last 8760 hours.  CBG: Recent Labs  Lab 02/21/20 1221 02/21/20 1642 02/21/20 2130 02/22/20 0754 02/22/20 1128  GLUCAP 287* 346* 315* 226* 344*    Recent Results (from the past 240 hour(s))  Surgical PCR screen     Status: None   Collection Time: 02/13/20  1:11 AM   Specimen: Nasal Mucosa; Nasal Swab  Result Value Ref Range Status   MRSA, PCR NEGATIVE NEGATIVE Final   Staphylococcus aureus NEGATIVE NEGATIVE Final    Comment: (NOTE) The Xpert SA Assay (FDA approved for NASAL specimens in patients 33 years of age and older), is one component of a comprehensive surveillance program. It is not intended to diagnose infection nor to guide or monitor treatment. Performed at [facility], [place] [address] [place], [place] [address]   Stool culture (children & immunocomp patients)     Status: None (Preliminary result)   Collection Time: 02/20/20  1:16 AM   Specimen: Stool  Result Value Ref Range Status   Salmonella/Shigella Screen PENDING  Incomplete   Campylobacter Culture PENDING  Incomplete   E coli, Shiga toxin Assay Negative Negative Final    Comment: (NOTE) Performed At: [facility] [address] [place], [place] [id number] Rush Farmer MD XB:[id number]      Studies: No results found.  Scheduled Meds: . aspirin  81 mg Oral Daily  . Chlorhexidine Gluconate  Cloth  6 each Topical Daily  . cholecalciferol  2,000 Units Oral Daily  . [START ON 02/23/2020] enoxaparin (LOVENOX) injection  40 mg Subcutaneous Q24H  . feeding supplement  1 Container Oral QID  . ferrous sulfate  325 mg Oral BID WC  . folic acid  1 mg Oral Daily  . furosemide   40 mg Intravenous Once  . insulin aspart  0-5 Units Subcutaneous QHS  . insulin aspart  0-6 Units Subcutaneous TID WC  . insulin aspart  3 Units Subcutaneous TID WC  . [START ON 02/23/2020] insulin glargine  25 Units Subcutaneous Daily  . lipase/protease/amylase  24,000 Units Oral TID WC  . nicotine  21 mg Transdermal Daily  . pantoprazole  40 mg Oral Daily  . potassium chloride  40 mEq Oral Once  . saccharomyces boulardii  250 mg Oral BID  . sodium bicarbonate  650 mg Oral TID  . sodium chloride flush  10-40 mL Intracatheter Q12H  . sodium chloride flush  3 mL Intravenous Q12H  . Vitamin D (Ergocalciferol)  50,000 Units Oral Q7 days    Continuous Infusions: . sodium chloride Stopped (02/13/20 0820)  . sodium chloride    . sodium chloride 250 mL (02/22/20 0004)  . cefTRIAXone (ROCEPHIN)  IV Stopped (02/21/20 2109)  . magnesium sulfate bolus IVPB    . metronidazole 500 mg (02/22/20 1610)     Time spent: 28mns I have personally reviewed and interpreted on  02/22/2020 daily labs, tele strips, imagings as discussed above under date review session and assessment and plans.  I reviewed all nursing notes, pharmacy notes, consultant notes,  vitals, pertinent old records  I have discussed plan of care as described above with RN , patient  on 02/22/2020   FFlorencia ReasonsMD, PhD, FACP  [facility]  Available via Epic secure chat 7am-7pm for nonurgent issues Please page for urgent issues, pager number available through [email or web address] .   02/22/2020, 1:40 PM  LOS: 12 days

## 2020-02-22 NOTE — Plan of Care (Signed)

## 2020-02-23 LAB — BASIC METABOLIC PANEL
Anion gap: 9 (ref 5–15)
BUN: 6 mg/dL (ref 6–20)
CO2: 23 mmol/L (ref 22–32)
Calcium: 7.7 mg/dL — ABNORMAL LOW (ref 8.9–10.3)
Chloride: 103 mmol/L (ref 98–111)
Creatinine, Ser: 1.03 mg/dL — ABNORMAL HIGH (ref 0.44–1.00)
GFR calc Af Amer: >60 mL/min (ref >60)
GFR calc non Af Amer: >60 mL/min (ref >60)
Glucose, Bld: 309 mg/dL — ABNORMAL HIGH (ref 70–99)
Potassium: 3.2 mmol/L — ABNORMAL LOW (ref 3.5–5.1)
Sodium: 135 mmol/L (ref 135–145)

## 2020-02-23 LAB — CBC WITH DIFFERENTIAL/PLATELET
Abs Immature Granulocytes: 0.28 10*3/uL — ABNORMAL HIGH (ref 0.00–0.07)
Basophils Absolute: 0 10*3/uL (ref 0.0–0.1)
Basophils Relative: 0 %
Eosinophils Absolute: 0.1 10*3/uL (ref 0.0–0.5)
Eosinophils Relative: 1 %
HCT: 26.7 % — ABNORMAL LOW (ref 36.0–46.0)
Hemoglobin: 8.6 g/dL — ABNORMAL LOW (ref 12.0–15.0)
Immature Granulocytes: 2 %
Lymphocytes Relative: 10 %
Lymphs Abs: 1.5 10*3/uL (ref 0.7–4.0)
MCH: 29.8 pg (ref 26.0–34.0)
MCHC: 32.2 g/dL (ref 30.0–36.0)
MCV: 92.4 fL (ref 80.0–100.0)
Monocytes Absolute: 0.9 10*3/uL (ref 0.1–1.0)
Monocytes Relative: 6 %
Neutro Abs: 12.4 10*3/uL — ABNORMAL HIGH (ref 1.7–7.7)
Neutrophils Relative %: 81 %
Platelets: 282 10*3/uL (ref 150–400)
RBC: 2.89 MIL/uL — ABNORMAL LOW (ref 3.87–5.11)
RDW: 15.2 % (ref 11.5–15.5)
WBC: 15.3 10*3/uL — ABNORMAL HIGH (ref 4.0–10.5)
nRBC: 0.1 % (ref 0.0–0.2)

## 2020-02-23 LAB — LIPID PANEL
Cholesterol: 53 mg/dL (ref 0–200)
HDL: 19 mg/dL — ABNORMAL LOW (ref >40)
LDL Cholesterol: 17 mg/dL (ref 0–99)
Total CHOL/HDL Ratio: 2.8 RATIO
Triglycerides: 84 mg/dL (ref <150)
VLDL: 17 mg/dL (ref 0–40)

## 2020-02-23 LAB — GI PATHOGEN PANEL BY PCR, STOOL

## 2020-02-23 LAB — GLUCOSE, CAPILLARY
Glucose-Capillary: 241 mg/dL — ABNORMAL HIGH (ref 70–99)
Glucose-Capillary: 265 mg/dL — ABNORMAL HIGH (ref 70–99)
Glucose-Capillary: 266 mg/dL — ABNORMAL HIGH (ref 70–99)
Glucose-Capillary: 386 mg/dL — ABNORMAL HIGH (ref 70–99)

## 2020-02-23 LAB — MAGNESIUM: Magnesium: 1.8 mg/dL (ref 1.7–2.4)

## 2020-02-23 LAB — LIPASE, BLOOD: Lipase: 164 U/L — ABNORMAL HIGH (ref 11–51)

## 2020-02-23 MED ORDER — MAGNESIUM SULFATE 2 GM/50ML IV SOLN
2.0000 g | Freq: Once | INTRAVENOUS | Status: AC
  Administered 2020-02-23: 2 g via INTRAVENOUS
  Filled 2020-02-23: qty 50

## 2020-02-23 MED ORDER — INSULIN GLARGINE 100 UNIT/ML ~~LOC~~ SOLN
18.0000 [IU] | Freq: Two times a day (BID) | SUBCUTANEOUS | Status: DC
  Administered 2020-02-23 (×2): 18 [IU] via SUBCUTANEOUS
  Filled 2020-02-23 (×4): qty 0.18

## 2020-02-23 MED ORDER — POTASSIUM CHLORIDE CRYS ER 20 MEQ PO TBCR
40.0000 meq | EXTENDED_RELEASE_TABLET | Freq: Two times a day (BID) | ORAL | Status: DC
  Administered 2020-02-23 – 2020-02-26 (×7): 40 meq via ORAL
  Filled 2020-02-23 (×7): qty 2

## 2020-02-23 MED ORDER — LOPERAMIDE HCL 2 MG PO CAPS
2.0000 mg | ORAL_CAPSULE | Freq: Four times a day (QID) | ORAL | Status: DC | PRN
  Administered 2020-02-23 – 2020-02-26 (×4): 2 mg via ORAL
  Filled 2020-02-23 (×4): qty 1

## 2020-02-23 NOTE — Progress Notes (Signed)
PROGRESS NOTE  JAMIRRA CURNOW QJJ:[id number] DOB: Oct 01, 1971 DOA: 02/09/2020 PCP: [name], MD  Brief Narrative / Interim history: 49 yr old obese F w/ significant PMHx alcohol abuse, tobacco abuse, IDDM, HTN, HLD and morbid obesity presented to [facility] with 10/10 chest pain. Per the triage note pt reported vomiting all day, being short of breath and having severe chest pain feeling like she was going to pass out. She denied loss of consciousness or syncopal episode.  Her initial Vitals were 133/70mHg HR 41 RR 26 O2 sat 94% .  CBG>'600mg'$ /dl.  Bicarb obtained.  AG 30. Na 131.  K5.3. Cr 2.85.  Lipase 1879 AST 582.  ALT 294. EKG showed an Idioventricular rhythm with LAD, AVB, LVH and an inferior infarct (QRS 162 ms and QT/QTc 574/467 ms). Troponin 730>> 2000.   Patient went into complete AVB with HR of 39 while in ED.  Started on external pacer.  Done she becomes somnolent after IV Versed requiring intubation.  Cardiology consulted and she was admitted to ICU.  She was also started on Unasyn for possible aspiration pneumonia.  Patient underwent LHC on 3/7 which was negative for CAD.  She had temporary venous pacemaker advanced at the same time.  Echo with EF of 40 to 45%, global hypokinesis, severe LVH and indeterminate diastolic function but no other significant finding.  She eventually had permanent PPM/ICD implant on 02/13/2020.  Patient was extubated on 02/11/2020.  Patient also with acute uncomplicated pancreatitis, rhabdomyolysis, significant hypocalcemia and vitamin D deficiency, transaminitis and iron deficiency anemia.    Cardiac issue has been stable, however she continued to complaint of post prandial abdominal bloating , frequent diarrhea.  She developed leukocytosis,Repeat CT scan March 16 showed persistent and and perhaps worsening of pancreatitis.  she is restarted on Rocephin and Flagyl to cover intra-abdominal infection in the setting of persistent pancreatitis.  WBC improved  on antibiotic, GI consulted    HPI/Recap of past 24 hours:  Continue to complain postprandial bloating /bandlike sensation but want to stay on soft diet today, no nausea ,no vomiting  Continue to frequent diarrhea with lower abdominal pain as well  Generalized edema has improved   T-max99.4, WBC trending down to 15    Assessment/Plan: Active Problems:   Complete heart block (HCC)   Diabetic ketoacidosis without coma associated with type 2 diabetes mellitus (HCC)   Elevated troponin   NSTEMI (non-ST elevated myocardial infarction) (HCC)   Lactic acid acidosis   Endotracheally intubated   ACS (acute coronary syndrome) (HCC)   Acute pancreatitis without infection or necrosis   Heart block AV complete (HCC)  Persistent pancreatitis/ab pain/watery diarrhea -Leukocytosis got worse on 3/16 with persistent diarrhea , stool study negative for cdiff, stool PCR panel negative , stool culture in process  -a Repeat CT scan done on 3/16 showed worsening of pancreatitis - she is started on Cipro and Flagyl on 3/16, WBC trending down -Started pancrelipase supplement -[facility] Dr. OPaulita Fujitaconsulted, input appreciated -Patient prefers to stay on soft diet,   Edematous/ fluid overload  +18.7 L since admission per documentation S/p 1 dose of Lasix on 3/17, x1 on 3/18, x1 on 3/19 Improving ,encourage ambulation Nutrition supplement  Non-STEMI: presented with severe chest pain Complete Heart Block/bifascicular block (RBBB and LAFB)  -s/p PPM and ICD on 02/13/2020.  Device interrogation normal. -Cardiology following.  New systolic CHF: Echo on 3/7 with LVEF of 40 to 45%, global hypokinesis, severe LVH. Acute hypoxic respiratory failure required intubation  initially Now on room air  Uncontrolled insulin dependent DM-2 with hypoglycemia:  Presented with DKA that has resolved.  A1c 12.7%.   with hypoglycemia and hyperglycemia C-peptide and insulin level within normal range. Continue adjust  insulin  AKI Multifactorial including cardiorenal, rhabdo and iatrogenic from ARB/HCTZ and NSAIDs Resolved  Hypokalemia/hypomagnesemia -Remain low, continue replace, recheck lab in the morning  hypophosphatemia -Replaced, normalized ,continue to monitor and replete accordingly.  QTc prolongation Keep on telemetry, keep mag above 2, k above 4 Avoid QT prolonging agent, change Cipro to Rocephin  Hypocalcemia/vitamin D deficiency:  Calcium 4.8.  Multifactorial including vitamin D deficiency (< 4.2), rhabdo and acute pancreatitis.  PTH is appropriately high.  Urine calcium appropriately low at 4.3. -s/p Calcium gluconate 3 g -P.o. vitamin D 50,000 IU weekly and 2000 daily-suspect poor observation in the setting of acute pancreatitis -Calcium improving, today is 7.7, albumin 1.5.  Rhabdomyolysis: Due to alcohol?  Statin?  CK 855> 954> 994.  CK-MB low. -IV fluid as above-increased rate. -Hold statin -CK normalized  Alcohol use disorder:  Reportedly drinks about 5-6 drinks including liquors -Counseled. -Consult TOC for resources  Tobacco use disorder: Smokes about a pack a day. -Cessation counseling -Nicotine patch  Body mass index is 37.77 kg/m.   DVT Prophylaxis: Lovenox subcu  Code Status: Full  Family Communication: Husband at bedside March 20 with his permission  Disposition Plan:    Patient came from:                    Home                                                                                       Anticipated d/c place:  Home with home health  Barriers to d/c OR conditions which need to be met to effect a safe d/c:  Home once able to tolerate diet advancement, diarrhea improves,Need gi clearance   Consultants:  [facility]  Critical care  Procedures:  ETT 3/7-3/8 LHC/temporary internal venous pacer on 3/7 PPM/ICD placement on 3/10 2D echo on 3/7-LVEF 40 to 45%, global hypokinesis. Severe LVH  Antibiotics:  Cipro and Flagyl then  rocephin/flagyl   Objective: BP 117/63 (BP Location: Left Arm)   Pulse 91   Temp 98.4 F (36.9 C) (Oral)   Resp 18   Ht '5\' 4"'$  (1.626 m)   Wt 99.8 kg   LMP 02/07/2020 Comment: negative preg test  SpO2 100%   BMI 37.77 kg/m   Intake/Output Summary (Last 24 hours) at 02/23/2020 0753 Last data filed at 02/22/2020 2144 Gross per 24 hour  Intake 148.07 ml  Output 150 ml  Net -1.93 ml   Filed Weights   02/20/20 0609 02/21/20 0626 02/22/20 0415  Weight: 101.7 kg 101.2 kg 99.8 kg    Exam: Patient is examined daily including today on 02/23/2020, exams remain the same as of yesterday except that has changed    General:  NAD  Cardiovascular: Paced rhythm  Respiratory: CTABL  Abdomen: protuberance, tight, right lower quadrant tenderness with guarding , no rebound , positive BS  Musculoskeletal: Generalized edema has improved  Neuro: alert, oriented  Data Reviewed: Basic Metabolic Panel: Recent Labs  Lab 02/17/20 0412 02/17/20 0412 02/18/20 0835 02/19/20 0237 02/21/20 0331 02/22/20 0255 02/23/20 0309  NA 136   < > 137 134* 133* 133* 135  K 4.1   < > 3.9 3.6 3.9 3.4* 3.2*  CL 106   < > 105 105 104 103 103  CO2 19*   < > 19* 20* 20* 20* 23  GLUCOSE 154*   < > 159* 229* 300* 246* 309*  BUN 8   < > '7 8 8 7 6  '$ CREATININE 1.00   < > 1.11* 1.00 0.98 0.90 1.03*  CALCIUM 5.0*   < > 5.6* 5.9* 7.1* 7.4* 7.7*  MG 1.8   < > 1.5* 1.5* 1.7 1.6* 1.8  PHOS 1.3*  --  1.6* 1.9*  --   --   --    < > = values in this interval not displayed.   Liver Function Tests: Recent Labs  Lab 02/17/20 0412 02/18/20 0835 02/19/20 0237 02/21/20 0331 02/22/20 0255  AST 50* 33  --  22 23  ALT 100* 66*  --  29 24  ALKPHOS 124 117  --  112 122  BILITOT 0.7 0.7  --  0.5 0.6  PROT 6.2* 6.6  --  6.1* 5.9*  ALBUMIN 1.7*  1.7* 1.7* 1.6* 1.5* 1.5*   Recent Labs  Lab 02/17/20 0412 02/21/20 0331 02/22/20 0255 02/23/20 0309  LIPASE 297* 208* 158* 164*   No results for input(s): AMMONIA in  the last 168 hours. CBC: Recent Labs  Lab 02/18/20 0835 02/18/20 0835 02/19/20 0237 02/20/20 0426 02/21/20 0331 02/22/20 0255 02/23/20 0309  WBC 20.7*   < > 21.6* 21.3* 16.0* 16.1* 15.3*  NEUTROABS 17.3*  --   --  18.1* 13.1* 13.4* 12.4*  HGB 9.3*   < > 8.7* 8.8* 8.5* 8.3* 8.6*  HCT 29.5*   < > 27.1* 27.4* 26.6* 26.3* 26.7*  MCV 92.2   < > 90.6 91.6 92.4 93.9 92.4  PLT 213   < > 209 217 197 254 282   < > = values in this interval not displayed.   Cardiac Enzymes:   Recent Labs  Lab 02/17/20 0412 02/18/20 0835 02/22/20 0255  CKTOTAL 843* 515* 51   BNP (last 3 results) No results for input(s): BNP in the last 8760 hours.  ProBNP (last 3 results) No results for input(s): PROBNP in the last 8760 hours.  CBG: Recent Labs  Lab 02/22/20 0754 02/22/20 1128 02/22/20 1613 02/22/20 2116 02/23/20 0723  GLUCAP 226* 344* 308* 297* 241*    Recent Results (from the past 240 hour(s))  Stool culture (children & immunocomp patients)     Status: None (Preliminary result)   Collection Time: 02/20/20  1:16 AM   Specimen: Stool  Result Value Ref Range Status   Salmonella/Shigella Screen Final report  Final   Campylobacter Culture PENDING  Incomplete   E coli, Shiga toxin Assay Negative Negative Final    Comment: (NOTE) Performed At: [facility] [address] [place], [place] [id number] Rush Farmer MD [id number]   GI pathogen panel by PCR, stool     Status: None   Collection Time: 02/20/20  1:16 AM   Specimen: Stool  Result Value Ref Range Status   Plesiomonas shigelloides NOT DETECTED NOT DETECTED Final   Yersinia enterocolitica NOT DETECTED NOT DETECTED Final   Vibrio NOT DETECTED NOT DETECTED Final   Enteropathogenic E coli NOT DETECTED NOT DETECTED Final  E coli (ETEC) LT/ST NOT DETECTED NOT DETECTED Final   E coli 0263 by PCR Not applicable NOT DETECTED Final   Cryptosporidium by PCR NOT DETECTED NOT DETECTED Final   Entamoeba histolytica NOT DETECTED  NOT DETECTED Final   Adenovirus F 40/41 NOT DETECTED NOT DETECTED Final   Norovirus GI/GII NOT DETECTED NOT DETECTED Final   Sapovirus NOT DETECTED NOT DETECTED Final    Comment: (NOTE) Performed At: [facility] [place], [place] [id number] Rush Farmer MD [id number]    Vibrio cholerae NOT DETECTED NOT DETECTED Final   Campylobacter by PCR NOT DETECTED NOT DETECTED Final   Salmonella by PCR NOT DETECTED NOT DETECTED Final   E coli (STEC) NOT DETECTED NOT DETECTED Final   Enteroaggregative E coli NOT DETECTED NOT DETECTED Final   Shigella by PCR NOT DETECTED NOT DETECTED Final   Cyclospora cayetanensis NOT DETECTED NOT DETECTED Final   Astrovirus NOT DETECTED NOT DETECTED Final   G lamblia by PCR NOT DETECTED NOT DETECTED Final   Rotavirus A by PCR NOT DETECTED NOT DETECTED Final  STOOL CULTURE REFLEX - RSASHR     Status: None   Collection Time: 02/20/20  1:16 AM  Result Value Ref Range Status   Stool Culture result 1 ([facility]ASHR) Comment  Final    Comment: (NOTE) No Salmonella or Shigella recovered. Performed At: [facility] [place], [place] [id number] Rush Farmer MD ZM:[id number]   C difficile quick scan w PCR reflex     Status: None   Collection Time: 02/22/20 11:39 AM   Specimen: STOOL  Result Value Ref Range Status   C Diff antigen NEGATIVE NEGATIVE Final   C Diff toxin NEGATIVE NEGATIVE Final   C Diff interpretation No C. difficile detected.  Final    Comment: Performed at [facility], [place] [address] [place], [place] [address]     Studies: No results found.  Scheduled Meds: . aspirin  81 mg Oral Daily  . Chlorhexidine Gluconate Cloth  6 each Topical Daily  . cholecalciferol  2,000 Units Oral Daily  . enoxaparin (LOVENOX) injection  40 mg Subcutaneous Q24H  . feeding supplement  1 Container Oral QID  . ferrous sulfate  325 mg Oral BID WC  . folic acid  1 mg Oral Daily  . insulin aspart  0-5 Units  Subcutaneous QHS  . insulin aspart  0-6 Units Subcutaneous TID WC  . insulin aspart  3 Units Subcutaneous TID WC  . insulin glargine  18 Units Subcutaneous BID  . lipase/protease/amylase  24,000 Units Oral TID WC  . nicotine  21 mg Transdermal Daily  . pantoprazole  40 mg Oral Daily  . potassium chloride  40 mEq Oral BID  . saccharomyces boulardii  250 mg Oral BID  . sodium bicarbonate  650 mg Oral TID  . sodium chloride flush  10-40 mL Intracatheter Q12H  . sodium chloride flush  3 mL Intravenous Q12H  . Vitamin D (Ergocalciferol)  50,000 Units Oral Q7 days    Continuous Infusions: . sodium chloride Stopped (02/13/20 0820)  . sodium chloride    . sodium chloride 250 mL (02/22/20 0004)  . cefTRIAXone (ROCEPHIN)  IV 2 g (02/22/20 2102)  . magnesium sulfate bolus IVPB    . metronidazole 500 mg (02/23/20 0135)     Time spent: 65mns I have personally reviewed and interpreted on  02/23/2020 daily labs, tele strips, imagings as discussed above under date review session and assessment  and plans.  I reviewed all nursing notes, pharmacy notes, consultant notes,  vitals, pertinent old records  I have discussed plan of care as described above with RN , patient  on 02/23/2020   Florencia Reasons MD, PhD, FACP  [facility]  Available via Epic secure chat 7am-7pm for nonurgent issues Please page for urgent issues, pager number available through [email or web address] .   02/23/2020, 7:53 AM  LOS: 13 days

## 2020-02-24 LAB — CBC WITH DIFFERENTIAL/PLATELET
Abs Immature Granulocytes: 0.16 K/uL — ABNORMAL HIGH (ref 0.00–0.07)
Basophils Absolute: 0 K/uL (ref 0.0–0.1)
Basophils Relative: 0 %
Eosinophils Absolute: 0.1 K/uL (ref 0.0–0.5)
Eosinophils Relative: 1 %
HCT: 27.5 % — ABNORMAL LOW (ref 36.0–46.0)
Hemoglobin: 8.6 g/dL — ABNORMAL LOW (ref 12.0–15.0)
Immature Granulocytes: 1 %
Lymphocytes Relative: 10 %
Lymphs Abs: 1.5 K/uL (ref 0.7–4.0)
MCH: 29.3 pg (ref 26.0–34.0)
MCHC: 31.3 g/dL (ref 30.0–36.0)
MCV: 93.5 fL (ref 80.0–100.0)
Monocytes Absolute: 1 K/uL (ref 0.1–1.0)
Monocytes Relative: 7 %
Neutro Abs: 11.6 K/uL — ABNORMAL HIGH (ref 1.7–7.7)
Neutrophils Relative %: 81 %
Platelets: 310 K/uL (ref 150–400)
RBC: 2.94 MIL/uL — ABNORMAL LOW (ref 3.87–5.11)
RDW: 15.5 % (ref 11.5–15.5)
WBC: 14.3 K/uL — ABNORMAL HIGH (ref 4.0–10.5)
nRBC: 0.1 % (ref 0.0–0.2)

## 2020-02-24 LAB — BASIC METABOLIC PANEL
Anion gap: 9 (ref 5–15)
BUN: 6 mg/dL (ref 6–20)
CO2: 24 mmol/L (ref 22–32)
Calcium: 7.9 mg/dL — ABNORMAL LOW (ref 8.9–10.3)
Chloride: 103 mmol/L (ref 98–111)
Creatinine, Ser: 0.83 mg/dL (ref 0.44–1.00)
GFR calc Af Amer: >60 mL/min (ref >60)
GFR calc non Af Amer: >60 mL/min (ref >60)
Glucose, Bld: 258 mg/dL — ABNORMAL HIGH (ref 70–99)
Potassium: 3.4 mmol/L — ABNORMAL LOW (ref 3.5–5.1)
Sodium: 136 mmol/L (ref 135–145)

## 2020-02-24 LAB — STOOL CULTURE: E coli, Shiga toxin Assay: NEGATIVE

## 2020-02-24 LAB — MAGNESIUM: Magnesium: 1.7 mg/dL (ref 1.7–2.4)

## 2020-02-24 LAB — PHOSPHORUS: Phosphorus: 2.9 mg/dL (ref 2.5–4.6)

## 2020-02-24 LAB — STOOL CULTURE REFLEX - CMPCXR

## 2020-02-24 LAB — GLUCOSE, CAPILLARY
Glucose-Capillary: 228 mg/dL — ABNORMAL HIGH (ref 70–99)
Glucose-Capillary: 220 mg/dL — ABNORMAL HIGH (ref 70–99)
Glucose-Capillary: 227 mg/dL — ABNORMAL HIGH (ref 70–99)
Glucose-Capillary: 260 mg/dL — ABNORMAL HIGH (ref 70–99)

## 2020-02-24 LAB — STOOL CULTURE REFLEX - RSASHR

## 2020-02-24 LAB — LIPASE, BLOOD: Lipase: 147 U/L — ABNORMAL HIGH (ref 11–51)

## 2020-02-24 MED ORDER — INSULIN GLARGINE 100 UNIT/ML ~~LOC~~ SOLN
22.0000 [IU] | Freq: Two times a day (BID) | SUBCUTANEOUS | Status: DC
  Administered 2020-02-24 – 2020-02-26 (×5): 22 [IU] via SUBCUTANEOUS
  Filled 2020-02-24 (×6): qty 0.22

## 2020-02-24 MED ORDER — METRONIDAZOLE 500 MG PO TABS
500.0000 mg | ORAL_TABLET | Freq: Three times a day (TID) | ORAL | Status: DC
  Administered 2020-02-24 – 2020-02-26 (×6): 500 mg via ORAL
  Filled 2020-02-24 (×6): qty 1

## 2020-02-24 MED ORDER — MAGNESIUM SULFATE 2 GM/50ML IV SOLN
2.0000 g | Freq: Once | INTRAVENOUS | Status: AC
  Administered 2020-02-24: 2 g via INTRAVENOUS
  Filled 2020-02-24: qty 50

## 2020-02-24 MED ORDER — NYSTATIN 100000 UNIT/ML MT SUSP
5.0000 mL | Freq: Four times a day (QID) | OROMUCOSAL | Status: DC
  Administered 2020-02-24 – 2020-02-26 (×6): 500000 [IU] via ORAL
  Filled 2020-02-24 (×7): qty 5

## 2020-02-24 NOTE — Progress Notes (Addendum)
   Called by RN about the dressing on her pacemaker site and if it was okay for her to take a shower.  Pacemaker was inserted on 02/13/2020.  She still has the pressure dressing in place.  Advised the nurse to remove the pressure dressing, do not remove any Steri-Strips.  As it has been more than 10 days since the pacemaker was inserted, she will need a wound check and device check.  We will get that tomorrow when EP staff is back in the hospital.  Advised the RN to keep the pacemaker site clean and dry  Rosaria Ferries, PA-C 02/24/2020 10:41 AM

## 2020-02-24 NOTE — Progress Notes (Signed)
PROGRESS NOTE  Sarah Long AUQ:[id number] DOB: [birth year] DOA: 02/09/2020 PCP: [name], MD  Brief Narrative / Interim history: 49 yr old obese F w/ significant PMHx alcohol abuse, tobacco abuse, IDDM, HTN, HLD and morbid obesity presented to [facility] with 10/10 chest pain. Per the triage note pt reported vomiting all day, being short of breath and having severe chest pain feeling like she was going to pass out. She denied loss of consciousness or syncopal episode.  Her initial Vitals were 133/4mHg HR 41 RR 26 O2 sat 94% .  CBG>6013mdl.  Bicarb obtained.  AG 30. Na 131.  K5.3. Cr 2.85.  Lipase 1879 AST 582.  ALT 294. EKG showed an Idioventricular rhythm with LAD, AVB, LVH and an inferior infarct (QRS 162 ms and QT/QTc 574/467 ms). Troponin 730>> 2000.   Patient went into complete AVB with HR of 39 while in ED.  Started on external pacer.  Done she becomes somnolent after IV Versed requiring intubation.  Cardiology consulted and she was admitted to ICU.  She was also started on Unasyn for possible aspiration pneumonia.  Patient underwent LHC on 3/7 which was negative for CAD.  She had temporary venous pacemaker advanced at the same time.  Echo with EF of 40 to 45%, global hypokinesis, severe LVH and indeterminate diastolic function but no other significant finding.  She eventually had permanent PPM/ICD implant on 02/13/2020.  Patient was extubated on 02/11/2020.  Patient also with acute uncomplicated pancreatitis, rhabdomyolysis, significant hypocalcemia and vitamin D deficiency, transaminitis and iron deficiency anemia.    Cardiac issue has been stable, however she continued to complaint of post prandial abdominal bloating , frequent diarrhea.  She developed leukocytosis,Repeat CT scan March 16 showed persistent and and perhaps worsening of pancreatitis.  she is restarted on Rocephin and Flagyl to cover intra-abdominal infection in the setting of persistent pancreatitis.  WBC improved  on antibiotic, GI consulted    HPI/Recap of past 24 hours:  She is feeling better today, tolerated soft diet, postprandial bloating has resolved  Diarrhea slowed down after starting Imodium  No nausea no vomiting,  Generalized edema has improved   T-max100.2, WBC trending down to 14   Assessment/Plan: Active Problems:   Complete heart block (HCC)   Diabetic ketoacidosis without coma associated with type 2 diabetes mellitus (HCC)   Elevated troponin   NSTEMI (non-ST elevated myocardial infarction) (HCC)   Lactic acid acidosis   Endotracheally intubated   ACS (acute coronary syndrome) (HCC)   Acute pancreatitis without infection or necrosis   Heart block AV complete ([place] Persistent pancreatitis/ab pain/watery diarrhea -Leukocytosis got worse on 3/16 with persistent diarrhea , stool study negative for cdiff, stool PCR panel negative , stool culture in process  -a Repeat CT scan done on 3/16 showed worsening of pancreatitis - she is started on Cipro and Flagyl on 3/16, WBC trending down -Started pancrelipase supplement,  -[facility] Dr. OuPaulita Fujitaonsulted, input appreciated -She tolerated soft diet, lipase trending down, will increase to regular diet, overall she has improved, likely able to discharge tomorrow  Edematous/ fluid overload  +18.7 L since admission per documentation S/p 1 dose of Lasix on 3/17, x1 on 3/18, x1 on 3/19 Improving ,encourage ambulation Nutrition supplement  Non-STEMI: presented with severe chest pain Complete Heart Block/bifascicular block (RBBB and LAFB)  -s/p PPM and ICD on 02/13/2020.  Device interrogation normal. -Cardiology plan to check ICD tomorrow on March 22.  New systolic CHF: Echo on 3/7 with LVEF  of 40 to 45%, global hypokinesis, severe LVH. Acute hypoxic respiratory failure required intubation initially Now on room air  Uncontrolled insulin dependent DM-2 with hypoglycemia:  Presented with DKA that has resolved.  A1c 12.7%.   with  hypoglycemia and hyperglycemia C-peptide and insulin level within normal range. Continue adjust insulin, currently on Lantus 22 units twice a day, meal coverage and sliding scales  AKI Multifactorial including cardiorenal, rhabdo and iatrogenic from ARB/HCTZ and NSAIDs Resolved  Hypokalemia/hypomagnesemia -Remain low, continue replace, recheck lab in the morning  hypophosphatemia -Replaced, normalized ,continue to monitor and replete accordingly.  QTc prolongation Keep on telemetry, keep mag above 2, k above 4 Avoid QT prolonging agent, change Cipro to Rocephin QTC down from 619 to 526  Hypocalcemia/vitamin D deficiency:  Calcium 4.8.  Multifactorial including vitamin D deficiency (< 4.2), rhabdo and acute pancreatitis.  PTH is appropriately high.  Urine calcium appropriately low at 4.3. -s/p Calcium gluconate 3 g -P.o. vitamin D 50,000 IU weekly and 2000 daily-suspect poor observation in the setting of acute pancreatitis -Calcium improving, today is 7.7, albumin 1.5.  Rhabdomyolysis: Due to alcohol?  Statin?  CK 855> 954> 994.  CK-MB low. -IV fluid as above-increased rate. -Hold statin -CK normalized  Alcohol use disorder:  Reportedly drinks about 5-6 drinks including liquors -Counseled. -Consult TOC for resources  Tobacco use disorder: Smokes about a pack a day. -Cessation counseling -Nicotine patch  Body mass index is 37.63 kg/m.  Social issues: Patient today reported that she is homeless, she has no insurance ,she and her husband currently live in a hotel. but soon will not be able to afford to live in a hotel anymore.  Social worker consulted.  DVT Prophylaxis while in the hospital: Lovenox subcu  Code Status: Full  Family Communication: Husband at bedside March 20 with his permission  Disposition Plan:    Patient came from:                    Home                                                                                       Anticipated d/c place:   Home with home health  Barriers to d/c OR conditions which need to be met to effect a safe d/c:  Home once able to tolerate diet advancement, diarrhea improves,Need gi clearance, need meds delivered to her piror to discharge, need TEFL teacher.   Consultants:  [place]  [facility]  Critical care  Procedures:  ETT 3/7-3/8 LHC/temporary internal venous pacer on 3/7 PPM/ICD placement on 3/10 2D echo on 3/7-LVEF 40 to 45%, global hypokinesis. Severe LVH  Antibiotics:  Cipro and Flagyl then rocephin/flagyl   Objective: BP 127/68 (BP Location: Left Arm)   Pulse 88   Temp 99 F (37.2 C) (Oral)   Resp 16   Ht _0  (1.626 m)   Wt 99.4 kg   LMP 02/07/2020 Comment: negative preg test  SpO2 100%   BMI 37.63 kg/m   Intake/Output Summary (Last 24 hours) at 02/24/2020 0733 Last data filed at 02/24/2020 0016 Gross per 24 hour  Intake 1910 ml  Output 400 ml  Net 1510 ml   Filed Weights   02/21/20 0626 02/22/20 0415 02/24/20 0701  Weight: 101.2 kg 99.8 kg 99.4 kg    Exam: Patient is examined daily including today on 02/24/2020, exams remain the same as of yesterday except that has changed    General:  NAD  Cardiovascular: Paced rhythm  Respiratory: CTABL  Abdomen: protuberance, soft, nontender, no rebound , positive BS (abdominal exam has improved)  Musculoskeletal: Generalized edema has improved  Neuro: alert, oriented   Data Reviewed: Basic Metabolic Panel: Recent Labs  Lab 02/18/20 0835 02/19/20 0237 02/21/20 0331 02/22/20 0255 02/23/20 0309  NA 137 134* 133* 133* 135  K 3.9 3.6 3.9 3.4* 3.2*  CL 105 105 104 103 103  CO2 19* 20* 20* 20* 23  GLUCOSE 159* 229* 300* 246* 309*  BUN _0 CREATININE 1.11* 1.00 0.98 0.90 1.03*  CALCIUM 5.6* 5.9* 7.1* 7.4* 7.7*  MG 1.5* 1.5* 1.7 1.6* 1.8  PHOS 1.6* 1.9*  --   --   --    Liver Function Tests: Recent Labs  Lab 02/18/20 0835 02/19/20 0237 02/21/20 0331 02/22/20 0255  AST 33  --  22 23  ALT  66*  --  29 24  ALKPHOS 117  --  112 122  BILITOT 0.7  --  0.5 0.6  PROT 6.6  --  6.1* 5.9*  ALBUMIN 1.7* 1.6* 1.5* 1.5*   Recent Labs  Lab 02/21/20 0331 02/22/20 0255 02/23/20 0309  LIPASE 208* 158* 164*   No results for input(s): AMMONIA in the last 168 hours. CBC: Recent Labs  Lab 02/20/20 0426 02/21/20 0331 02/22/20 0255 02/23/20 0309 02/24/20 0657  WBC 21.3* 16.0* 16.1* 15.3* 14.3*  NEUTROABS 18.1* 13.1* 13.4* 12.4* 11.6*  HGB 8.8* 8.5* 8.3* 8.6* 8.6*  HCT 27.4* 26.6* 26.3* 26.7* 27.5*  MCV 91.6 92.4 93.9 92.4 93.5  PLT 217 197 254 282 310   Cardiac Enzymes:   Recent Labs  Lab 02/18/20 0835 02/22/20 0255  CKTOTAL 515* 51   BNP (last 3 results) No results for input(s): BNP in the last 8760 hours.  ProBNP (last 3 results) No results for input(s): PROBNP in the last 8760 hours.  CBG: Recent Labs  Lab 02/22/20 2116 02/23/20 0723 02/23/20 1133 02/23/20 1623 02/23/20 2122  GLUCAP 297* 241* 265* 266* 386*    Recent Results (from the past 240 hour(s))  Stool culture (children & immunocomp patients)     Status: None (Preliminary result)   Collection Time: 02/20/20  1:16 AM   Specimen: Stool  Result Value Ref Range Status   Salmonella/Shigella Screen Final report  Final   Campylobacter Culture PENDING  Incomplete   E coli, Shiga toxin Assay Negative Negative Final    Comment: (NOTE) Performed At: [facility] [address] [place], [place] [id number] Rush Farmer MD [id number]   GI pathogen panel by PCR, stool     Status: None   Collection Time: 02/20/20  1:16 AM   Specimen: Stool  Result Value Ref Range Status   Plesiomonas shigelloides NOT DETECTED NOT DETECTED Final   Yersinia enterocolitica NOT DETECTED NOT DETECTED Final   Vibrio NOT DETECTED NOT DETECTED Final   Enteropathogenic E coli NOT DETECTED NOT DETECTED Final   E coli (ETEC) LT/ST NOT DETECTED NOT DETECTED Final   E coli 1505 by PCR Not applicable NOT DETECTED Final    Cryptosporidium by PCR NOT DETECTED NOT DETECTED Final   Entamoeba histolytica  NOT DETECTED NOT DETECTED Final   Adenovirus F 40/41 NOT DETECTED NOT DETECTED Final   Norovirus GI/GII NOT DETECTED NOT DETECTED Final   Sapovirus NOT DETECTED NOT DETECTED Final    Comment: (NOTE) Performed At: [facility] [place], [place] [id number] Rush Farmer MD [id number]:[id number]    Vibrio cholerae NOT DETECTED NOT DETECTED Final   Campylobacter by PCR NOT DETECTED NOT DETECTED Final   Salmonella by PCR NOT DETECTED NOT DETECTED Final   E coli (STEC) NOT DETECTED NOT DETECTED Final   Enteroaggregative E coli NOT DETECTED NOT DETECTED Final   Shigella by PCR NOT DETECTED NOT DETECTED Final   Cyclospora cayetanensis NOT DETECTED NOT DETECTED Final   Astrovirus NOT DETECTED NOT DETECTED Final   G lamblia by PCR NOT DETECTED NOT DETECTED Final   Rotavirus A by PCR NOT DETECTED NOT DETECTED Final  STOOL CULTURE REFLEX - RSASHR     Status: None   Collection Time: 02/20/20  1:16 AM  Result Value Ref Range Status   Stool Culture result 1 ([facility]ASHR) Comment  Final    Comment: (NOTE) No Salmonella or Shigella recovered. Performed At: [facility] [place], [place] [id number] Rush Farmer MD VZ:[id number]   C difficile quick scan w PCR reflex     Status: None   Collection Time: 02/22/20 11:39 AM   Specimen: STOOL  Result Value Ref Range Status   C Diff antigen NEGATIVE NEGATIVE Final   C Diff toxin NEGATIVE NEGATIVE Final   C Diff interpretation No C. difficile detected.  Final    Comment: Performed at [facility], [place] [address] [place], [place] [address]     Studies: No results found.  Scheduled Meds: . aspirin  81 mg Oral Daily  . Chlorhexidine Gluconate Cloth  6 each Topical Daily  . cholecalciferol  2,000 Units Oral Daily  . enoxaparin (LOVENOX) injection  40 mg Subcutaneous Q24H  . feeding supplement  1 Container Oral QID  . ferrous  sulfate  325 mg Oral BID WC  . folic acid  1 mg Oral Daily  . insulin aspart  0-5 Units Subcutaneous QHS  . insulin aspart  0-6 Units Subcutaneous TID WC  . insulin aspart  3 Units Subcutaneous TID WC  . insulin glargine  18 Units Subcutaneous BID  . lipase/protease/amylase  24,000 Units Oral TID WC  . nicotine  21 mg Transdermal Daily  . pantoprazole  40 mg Oral Daily  . potassium chloride  40 mEq Oral BID  . saccharomyces boulardii  250 mg Oral BID  . sodium bicarbonate  650 mg Oral TID  . sodium chloride flush  10-40 mL Intracatheter Q12H  . sodium chloride flush  3 mL Intravenous Q12H  . Vitamin D (Ergocalciferol)  50,000 Units Oral Q7 days    Continuous Infusions: . sodium chloride Stopped (02/13/20 0820)  . sodium chloride    . sodium chloride 250 mL (02/22/20 0004)  . cefTRIAXone (ROCEPHIN)  IV Stopped (02/23/20 2105)  . metronidazole 500 mg (02/24/20 0016)     Time spent: 5mns I have personally reviewed and interpreted on  02/24/2020 daily labs, tele strips, imagings as discussed above under date review session and assessment and plans.  I reviewed all nursing notes, pharmacy notes, consultant notes,  vitals, pertinent old records  I have discussed plan of care as described above with RN , patient  on 02/24/2020   FFlorencia ReasonsMD, PhD, FACP  [facility]  Available via Epic secure chat 7am-7pm for nonurgent issues Please page for urgent issues, pager number available through [email or web address] .   02/24/2020, 7:33 AM  LOS: 14 days

## 2020-02-25 LAB — CBC WITH DIFFERENTIAL/PLATELET
Abs Immature Granulocytes: 0.12 10*3/uL — ABNORMAL HIGH (ref 0.00–0.07)
Basophils Absolute: 0 10*3/uL (ref 0.0–0.1)
Basophils Relative: 0 %
Eosinophils Absolute: 0.1 10*3/uL (ref 0.0–0.5)
Eosinophils Relative: 1 %
HCT: 26.9 % — ABNORMAL LOW (ref 36.0–46.0)
Hemoglobin: 8.5 g/dL — ABNORMAL LOW (ref 12.0–15.0)
Immature Granulocytes: 1 %
Lymphocytes Relative: 10 %
Lymphs Abs: 1.3 10*3/uL (ref 0.7–4.0)
MCH: 29.9 pg (ref 26.0–34.0)
MCHC: 31.6 g/dL (ref 30.0–36.0)
MCV: 94.7 fL (ref 80.0–100.0)
Monocytes Absolute: 0.9 10*3/uL (ref 0.1–1.0)
Monocytes Relative: 7 %
Neutro Abs: 10.3 10*3/uL — ABNORMAL HIGH (ref 1.7–7.7)
Neutrophils Relative %: 81 %
Platelets: 328 10*3/uL (ref 150–400)
RBC: 2.84 MIL/uL — ABNORMAL LOW (ref 3.87–5.11)
RDW: 15.8 % — ABNORMAL HIGH (ref 11.5–15.5)
WBC: 12.8 10*3/uL — ABNORMAL HIGH (ref 4.0–10.5)
nRBC: 0.3 % — ABNORMAL HIGH (ref 0.0–0.2)

## 2020-02-25 LAB — BASIC METABOLIC PANEL
Anion gap: 8 (ref 5–15)
BUN: 6 mg/dL (ref 6–20)
CO2: 24 mmol/L (ref 22–32)
Calcium: 8 mg/dL — ABNORMAL LOW (ref 8.9–10.3)
Chloride: 105 mmol/L (ref 98–111)
Creatinine, Ser: 0.8 mg/dL (ref 0.44–1.00)
GFR calc Af Amer: >60 mL/min (ref >60)
GFR calc non Af Amer: >60 mL/min (ref >60)
Glucose, Bld: 202 mg/dL — ABNORMAL HIGH (ref 70–99)
Potassium: 3.5 mmol/L (ref 3.5–5.1)
Sodium: 137 mmol/L (ref 135–145)

## 2020-02-25 LAB — GLUCOSE, CAPILLARY
Glucose-Capillary: 182 mg/dL — ABNORMAL HIGH (ref 70–99)
Glucose-Capillary: 162 mg/dL — ABNORMAL HIGH (ref 70–99)
Glucose-Capillary: 157 mg/dL — ABNORMAL HIGH (ref 70–99)
Glucose-Capillary: 155 mg/dL — ABNORMAL HIGH (ref 70–99)

## 2020-02-25 LAB — O&P RESULT

## 2020-02-25 LAB — MAGNESIUM: Magnesium: 1.7 mg/dL (ref 1.7–2.4)

## 2020-02-25 LAB — LIPASE, BLOOD: Lipase: 125 U/L — ABNORMAL HIGH (ref 11–51)

## 2020-02-25 LAB — OVA + PARASITE EXAM

## 2020-02-25 MED ORDER — MAGNESIUM SULFATE IN D5W 1-5 GM/100ML-% IV SOLN
1.0000 g | Freq: Once | INTRAVENOUS | Status: AC
  Administered 2020-02-25: 1 g via INTRAVENOUS
  Filled 2020-02-25: qty 100

## 2020-02-25 MED ORDER — FUROSEMIDE 20 MG PO TABS
20.0000 mg | ORAL_TABLET | Freq: Every day | ORAL | Status: DC
  Administered 2020-02-25 – 2020-02-26 (×2): 20 mg via ORAL
  Filled 2020-02-25 (×2): qty 1

## 2020-02-25 NOTE — Progress Notes (Signed)
  Pt wound check performed in hospital. Steri-strips removed as today is day of discharge. Wound well healed and without redness, discharge, or edema.   Thresholds, sensing, and impedence consistent with implant. 2 AMS which appear short runs of AT.   Continue wound restrictions reviewed (lifting). OK to drive from our standpoint. OK to bath/shower. Still no lotions, creams, ointments, or powders on device site.   Legrand Como [address] [place], PA-C  02/25/2020 11:24 AM

## 2020-02-25 NOTE — Discharge Instructions (Addendum)
Diabetes Mellitus and Sick Day Management Blood sugar (glucose) can be difficult to control when you are sick. Common illnesses that can cause problems for people with diabetes (diabetes mellitus) include colds, fever, flu (influenza), nausea, vomiting, and diarrhea. These illnesses can cause stress and loss of body fluids (dehydration), and those issues can cause blood glucose levels to increase. Because of this, it is very important to take your insulin and diabetes medicines and eat some form of carbohydrate when you are sick. You should make a plan for days when you are sick (sick day plan) as part of your diabetes management plan. You and your health care provider should make this plan in advance. The following guidelines are intended to help you manage an illness that lasts for about 24 hours or less. Your health care provider may also give you more specific instructions. What do I need to do to manage my blood glucose?   Check your blood glucose every 2-4 hours, or as often as told by your health care provider.  Know your sick day treatment goals. Your target blood glucose levels may be different when you are sick.  If you use insulin, take your usual dose. ? If your blood glucose continues to be too high, you may need to take an additional insulin dose as told by your health care provider.  If you use oral diabetes medicine, you may need to stop taking it if you are not able to eat or drink normally. Ask your health care provider about whether you need to stop taking these medicines while you are sick.  If you use injectable hormone medicines other than insulin to control your diabetes, ask your health care provider about whether you need to stop taking these medicines while you are sick. What else can I do to manage my diabetes when I am sick? Check your ketones  If you have type 1 diabetes, check your urine ketones every 4 hours.  If you have type 2 diabetes, check your urine  ketones as often as told by your health care provider. Drink fluids  Drink enough fluid to keep your urine clear or pale yellow. This is especially important if you have a fever, vomiting, or diarrhea. Those symptoms can lead to dehydration.  Follow any instructions from your health care provider about beverages to avoid. ? Do not drink alcohol, caffeine, or drinks that contain a lot of sugar. Take medicines as directed  Take-over-the-counter and prescription medicines only as told by your health care provider.  Check medicine labels for added sugars. Some medicines may contain sugar or types of sugars that can raise your blood glucose level. What foods can I eat when I am sick?  You need to eat some form of carbohydrates when you are sick. You should eat 45-50 grams (45-50 g) of carbohydrates every 3-4 hours until you feel better. All of the food choices below contain about 15 g of carbohydrates. Plan ahead and keep some of these foods around so you have them if you get sick.  4-6 oz (120-177 mL) carbonated beverage that contains sugar, such as regular (not diet) soda. You may be able to drink carbonated beverages more easily if you open the beverage and let it sit at room temperature for a few minutes before drinking.   of a twin frozen ice pop.  4 oz (120 g) regular gelatin.  4 oz (120 mL) fruit juice.  4 oz (120 g) ice cream or frozen yogurt.  2 oz (60 g) sherbet.  8 oz (240 mL) clear broth or soup.  4 oz (120 g) regular custard.  4 oz (120 g) regular pudding.  8 oz (240 g) plain yogurt.  1 slice bread or toast.  6 saltine crackers.  5 vanilla wafers. Questions to ask your health care provider Consider asking the following questions so you know what to do on days when you are sick:  Should I adjust my diabetes medicines?  How often do I need to check my blood glucose?  What supplies do I need to manage my diabetes at home when I am sick?  What number can I call  if I have questions?  What foods and drinks should I avoid? Contact a health care provider if:  You develop symptoms of diabetic ketoacidosis, such as: ? Fatigue. ? Weight loss. ? Excessive thirst. ? Light-headedness. ? Fruity or sweet-smelling breath. ? Excessive urination. ? Vision changes. ? Confusion or irritability. ? Nausea. ? Vomiting. ? Rapid breathing. ? Pain in the abdomen. ? Feeling flushed.  You are unable to drink fluids without vomiting.  You have any of the following for more than 6 hours: ? Nausea. ? Vomiting. ? Diarrhea.  Your blood glucose is at or above 240 mg/dL (13.3 mmol/L), even after you take an additional insulin dose.  You have a change in how you think, feel, or act (mental status).  You develop another serious illness.  You have been sick or have had a fever for 2 days or longer and you are not getting better. Get help right away if:  Your blood glucose is lower than 54 mg/dL (3.0 mmol/L).  You have difficulty breathing.  You have moderate or high ketone levels in your urine.  You used emergency glucagon to treat low blood glucose. Summary  Blood sugar (glucose) can be difficult to control when you are sick. Common illnesses that can cause problems for people with diabetes (diabetes mellitus) include colds, fever, flu (influenza), nausea, vomiting, and diarrhea.  Illnesses can cause stress and loss of body fluids (dehydration), and those issues can cause blood glucose levels to increase.  Make a plan for days when you are sick (sick day plan) as part of your diabetes management plan. You and your health care provider should make this plan in advance.  It is very important to take your insulin and diabetes medicines and to eat some form of carbohydrate when you are sick.  Contact your health care provider if have problems managing your blood glucose levels when you are sick, or if you have been sick or had a fever for 2 days or longer  and are not getting better. This information is not intended to replace advice given to you by your health care provider. Make sure you discuss any questions you have with your health care provider. Document Revised: 08/20/2016 Document Reviewed: 08/20/2016 Elsevier Patient Education  [place]. Insulin Injection Instructions, Single Insulin Dose, Adult A subcutaneous injection is a shot of medicine that is injected into the layer of fat and tissue between skin and muscle. People with type 1 diabetes must take insulin because their bodies do not make it. People with type 2 diabetes may need to take insulin.  There are many different types of insulin. The type of insulin that you take may determine how many injections you give yourself and when you need to give the injections. Supplies needed:  Soap and water to wash hands.  A new,  unused insulin syringe.  Your insulin medication bottle (vial).  Alcohol wipes.  A disposal container that is meant for sharp items (sharps container), such as an empty plastic bottle with a cover. How to choose a site for injection The body absorbs insulin differently, depending on where the insulin is injected (injection site). It is best to inject insulin into the same body area each time (for example, always in the abdomen), but you should use a different spot in that area for each injection. Do not inject the insulin in the same spot each time. There are five main areas that can be used for injecting. These areas include:  Abdomen. This is the preferred area.  Front of thigh.  Upper, outer side of thigh.  Upper, outer side of arm.  Upper, outer part of buttock. How to give a single-dose insulin injection First, follow the steps for Get ready, then continue with the steps for Push air into the vial, then follow the steps for Fill the syringe, and finish with the steps for Inject the insulin. Get ready 1. Wash your hands with soap and water. If  soap and water are not available, use hand sanitizer. 2. Before you give yourself an insulin injection, be sure to test your blood sugar level (blood glucose level) and write down that number. Follow any instructions from your health care provider about what to do if your blood glucose level is higher or lower than your normal range. 3. Use a new, unused insulin syringe each time you need to inject insulin. 4. Check to make sure you have the correct type of insulin syringe for the concentration of insulin that you are using. 5. Check the expiration date and the type of insulin that you are using. 6. If you are using CLEAR insulin, check to see that it is clear and free of clumps. 7. Do not shake the vial to get it ready. Gently roll the vial between your palms several times. 8. Remove the plastic pop-top covering from the vial of insulin. This type of covering is present on a vial when it is new. 9. Use an alcohol wipe to clean the rubber top of the vial. 10. Remove the plastic cover from the syringe needle. Do not let the needle touch anything. Push air into the vial 1. To bring (draw up) air into the syringe, slowly pull back on the syringe plunger. Stop pulling the plunger when the dose indicator gets to the number of units that you will be using. 2. While you keep the vial right-side-up, poke the needle through the rubber top of the vial. Do not turn the vial upside down to do this. 3. Push the plunger all the way into the syringe. Doing that will push air into the vial. 4. Do not take the needle out of the vial yet. Fill the syringe  1. While the needle is still in the vial, turn the vial upside down and hold it at eye level. 2. Slowly pull back on the plunger. Stop pulling the plunger when the dose indicator gets to the desired number of units. 3. If you see air bubbles in the syringe, slowly move the plunger up and down 2 or 3 times to make them go away. ? If you had to move the plunger to  get rid of air bubbles, pull back the plunger until the dose indicator returns to the correct dose. 4. Remove the needle from the vial. Do not let the needle touch  anything. Inject the insulin  1. Use an alcohol wipe to clean the site where you will be injecting the needle. Let the site air-dry. 2. Hold the syringe in your writing hand like a pencil. 3. Use your other hand to pinch and hold about an inch (2.5 cm) of skin. Do not directly touch the cleaned part of the skin. 4. Gently but quickly, put the needle straight into the skin. The needle should be at a 90-degree angle (perpendicular) to the skin. 5. Push the needle in as far as it will go (to the hub). 6. When the needle is completely inserted into the skin, use your thumb or index finger of your writing hand to push the plunger all the way into the syringe to inject the insulin. 7. Let go of the skin that you are pinching. Continue to hold the syringe in place with your writing hand. 8. Wait 10 seconds, then pull the needle straight out of the skin. This will allow all of the insulin to go from the syringe and needle into your body. 9. Press and hold the alcohol wipe over the injection site until any bleeding stops. Do not rub the area. 10. Do not put the plastic cover back on the needle. 11. Discard the syringe and needle directly into a sharps container, such as an empty plastic bottle with a cover. How to throw away supplies  Discard all used needles in a puncture-proof sharps disposal container. You can ask your local pharmacy about where you can get this kind of disposal container, or you can use an empty plastic liquid laundry detergent bottle that has a cover.  Follow the disposal regulations for the area where you live. Do not use any syringe or needle more than one time.  Throw away empty vials in the regular trash. Questions to ask your health care provider  How often should I be taking insulin?  How often should I check  my blood glucose?  What amount of insulin should I be taking at each time?  What are the side effects?  What should I do if my blood glucose is too high?  What should I do if my blood glucose is too low?  What should I do if I forget to take my insulin?  What number should I call if I have questions? Where to find more information  American Diabetes Association (ADA): www.diabetes.org  American Association of Diabetes Educators (AADE) Patient Resources: https://www.diabeteseducator.org Summary  A subcutaneous injection is a shot of medicine that is injected into the layer of fat and tissue between skin and muscle.  Before you give yourself an insulin injection, be sure to test your blood sugar level (blood glucose level) and write down that number.  The type of insulin that you take may determine how many injections you give yourself and when you need to give the injections.  Check the expiration date and the type of insulin that you are using.  It is best to inject insulin into the same body area each time (for example, always in the abdomen), but you should use a different spot in that area for each injection. This information is not intended to replace advice given to you by your health care provider. Make sure you discuss any questions you have with your health care provider. Document Revised: 11/25/2017 Document Reviewed: 12/26/2015 Elsevier Patient Education  [place]. Hemoglobin A1c Test Why am I having this test? You may have the hemoglobin A1c test (  HbA1c test) done to:  Evaluate your risk for developing diabetes (diabetes mellitus).  Diagnose diabetes.  Monitor long-term control of blood sugar (glucose) in people who have diabetes and help make treatment decisions. This test may be done with other blood glucose tests, such as fasting blood glucose and oral glucose tolerance tests. What is being tested? Hemoglobin is a type of protein in the blood that  carries oxygen. Glucose attaches to hemoglobin to form glycated hemoglobin. This test checks the amount of glycated hemoglobin in your blood, which is a good indicator of the average amount of glucose in your blood during the past 2-3 months. What kind of sample is taken?  A blood sample is required for this test. It is usually collected by inserting a needle into a blood vessel. Tell a health care provider about:  All medicines you are taking, including vitamins, herbs, eye drops, creams, and over-the-counter medicines.  Any blood disorders you have.  Any surgeries you have had.  Any medical conditions you have.  Whether you are pregnant or may be pregnant. How are the results reported? Your results will be reported as a percentage that indicates how much of your hemoglobin has glucose attached to it (is glycated). Your health care provider will compare your results to normal ranges that were established after testing a large group of people (reference ranges). Reference ranges may vary among labs and hospitals. For this test, common reference ranges are:  Adult or child without diabetes: 4-5.6%.  Adult or child with diabetes and good blood glucose control: less than 7%. What do the results mean? If you have diabetes:  A result of less than 7% is considered normal, meaning that your blood glucose is well controlled.  A result higher than 7% means that your blood glucose is not well controlled, and your treatment plan may need to be adjusted. If you do not have diabetes:  A result within the reference range is considered normal, meaning that you are not at high risk for diabetes.  A result of 5.7-6.4% means that you have a high risk of developing diabetes, and you may have prediabetes. Prediabetes is the condition of having a blood glucose level that is higher than it should be, but not high enough for you to be diagnosed with diabetes. Having prediabetes puts you at risk for  developing type 2 diabetes (type 2 diabetes mellitus). You may have more tests, including a repeat HbA1c test.  Results of 6.5% or higher on two separate HbA1c tests mean that you have diabetes. You may have more tests to confirm the diagnosis. Abnormally low HbA1c values may be caused by:  Pregnancy.  Severe blood loss.  Receiving donated blood (transfusions).  Low red blood cell count (anemia).  Long-term kidney failure.  Some unusual forms (variants) of hemoglobin. Talk with your health care provider about what your results mean. Questions to ask your health care provider Ask your health care provider, or the department that is doing the test:  When will my results be ready?  How will I get my results?  What are my treatment options?  What other tests do I need?  What are my next steps? Summary  The hemoglobin A1c test (HbA1c test) may be done to evaluate your risk for developing diabetes, to diagnose diabetes, and to monitor long-term control of blood sugar (glucose) in people who have diabetes and help make treatment decisions.  Hemoglobin is a type of protein in the blood that carries oxygen.  Glucose attaches to hemoglobin to form glycated hemoglobin. This test checks the amount of glycated hemoglobin in your blood, which is a good indicator of the average amount of glucose in your blood during the past 2-3 months.  Talk with your health care provider about what your results mean. This information is not intended to replace advice given to you by your health care provider. Make sure you discuss any questions you have with your health care provider. Document Revised: 11/04/2017 Document Reviewed: 07/05/2017 Elsevier Patient Education  [place]. Hyperglycemia Hyperglycemia occurs when the level of sugar (glucose) in the blood is too high. Glucose is a type of sugar that provides the body's main source of energy. Certain hormones (insulin and glucagon) control the  level of glucose in the blood. Insulin lowers blood glucose, and glucagon increases blood glucose. Hyperglycemia can result from having too little insulin in the bloodstream, or from the body not responding normally to insulin. Hyperglycemia occurs most often in people who have diabetes (diabetes mellitus), but it can happen in people who do not have diabetes. It can develop quickly, and it can be life-threatening if it causes you to become severely dehydrated (diabetic ketoacidosis or hyperglycemic hyperosmolar state). Severe hyperglycemia is a medical emergency. What are the causes? If you have diabetes, hyperglycemia may be caused by:  Diabetes medicine.  Medicines that increase blood glucose or affect your diabetes control.  Not eating enough, or not eating often enough.  Changes in physical activity level.  Being sick or having an infection. If you have prediabetes or undiagnosed diabetes:  Hyperglycemia may be caused by those conditions. If you do not have diabetes, hyperglycemia may be caused by:  Certain medicines, including steroid medicines, beta-blockers, epinephrine, and thiazide diuretics.  Stress.  Serious illness.  Surgery.  Diseases of the pancreas.  Infection. What increases the risk? Hyperglycemia is more likely to develop in people who have risk factors for diabetes, such as:  Having a family member with diabetes.  Having a gene for type 1 diabetes that is passed from parent to child (inherited).  Living in an area with cold weather conditions.  Exposure to certain viruses.  Certain conditions in which the body's disease-fighting (immune) system attacks itself (autoimmune disorders).  Being overweight or obese.  Having an inactive (sedentary) lifestyle.  Having been diagnosed with insulin resistance.  Having a history of prediabetes, gestational diabetes, or polycystic ovarian syndrome (PCOS).  Being of American-Indian, African-American,  Hispanic/Latino, or Asian/Pacific Islander descent. What are the signs or symptoms? Hyperglycemia may not cause any symptoms. If you do have symptoms, they may include early warning signs, such as:  Increased thirst.  Hunger.  Feeling very tired.  Needing to urinate more often than usual.  Blurry vision. Other symptoms may develop if hyperglycemia gets worse, such as:  Dry mouth.  Loss of appetite.  Fruity-smelling breath.  Weakness.  Unexpected or rapid weight gain or weight loss.  Tingling or numbness in the hands or feet.  Headache.  Skin that does not quickly return to normal after being lightly pinched and released (poor skin turgor).  Abdominal pain.  Cuts or bruises that are slow to heal. How is this diagnosed? Hyperglycemia is diagnosed with a blood test to measure your blood glucose level. This blood test is usually done while you are having symptoms. Your health care provider may also do a physical exam and review your medical history. You may have more tests to determine the cause of your hyperglycemia,  such as:  A fasting blood glucose (FBG) test. You will not be allowed to eat (you will fast) for at least 8 hours before a blood sample is taken.  An A1c (hemoglobin A1c) blood test. This provides information about blood glucose control over the previous 2-3 months.  An oral glucose tolerance test (OGTT). This measures your blood glucose at two times: ? After fasting. This is your baseline blood glucose level. ? Two hours after drinking a beverage that contains glucose. How is this treated? Treatment depends on the cause of your hyperglycemia. Treatment may include:  Taking medicine to regulate your blood glucose levels. If you take insulin or other diabetes medicines, your medicine or dosage may be adjusted.  Lifestyle changes, such as exercising more, eating healthier foods, or losing weight.  Treating an illness or infection, if this caused your  hyperglycemia.  Checking your blood glucose more often.  Stopping or reducing steroid medicines, if these caused your hyperglycemia. If your hyperglycemia becomes severe and it results in hyperglycemic hyperosmolar state, you must be hospitalized and given IV fluids. Follow these instructions at home:  General instructions  Take over-the-counter and prescription medicines only as told by your health care provider.  Do not use any products that contain nicotine or tobacco, such as cigarettes and e-cigarettes. If you need help quitting, ask your health care provider.  Limit alcohol intake to no more than 1 drink per day for nonpregnant women and 2 drinks per day for men. One drink equals 12 oz of beer, 5 oz of wine, or 1 oz of hard liquor.  Learn to manage stress. If you need help with this, ask your health care provider.  Keep all follow-up visits as told by your health care provider. This is important. Eating and drinking   Maintain a healthy weight.  Exercise regularly, as directed by your health care provider.  Stay hydrated, especially when you exercise, get sick, or spend time in hot temperatures.  Eat healthy foods, such as: ? Lean proteins. ? Complex carbohydrates. ? Fresh fruits and vegetables. ? Low-fat dairy products. ? Healthy fats.  Drink enough fluid to keep your urine clear or pale yellow. If you have diabetes:  Make sure you know the symptoms of hyperglycemia.  Follow your diabetes management plan, as told by your health care provider. Make sure you: ? Take your insulin and medicines as directed. ? Follow your exercise plan. ? Follow your meal plan. Eat on time, and do not skip meals. ? Check your blood glucose as often as directed. Make sure to check your blood glucose before and after exercise. If you exercise longer or in a different way than usual, check your blood glucose more often. ? Follow your sick day plan whenever you cannot eat or drink  normally. Make this plan in advance with your health care provider.  Share your diabetes management plan with people in your workplace, school, and household.  Check your urine for ketones when you are ill and as told by your health care provider.  Carry a medical alert card or wear medical alert jewelry. Contact a health care provider if:  Your blood glucose is at or above 240 mg/dL (13.3 mmol/L) for 2 days in a row.  You have problems keeping your blood glucose in your target range.  You have frequent episodes of hyperglycemia. Get help right away if:  You have difficulty breathing.  You have a change in how you think, feel, or act (mental status).  You have nausea or vomiting that does not go away. These symptoms may represent a serious problem that is an emergency. Do not wait to see if the symptoms will go away. Get medical help right away. Call your local emergency services (911 in the [place] Do not drive yourself to the hospital. Summary  Hyperglycemia occurs when the level of sugar (glucose) in the blood is too high.  Hyperglycemia is diagnosed with a blood test to measure your blood glucose level. This blood test is usually done while you are having symptoms. Your health care provider may also do a physical exam and review your medical history.  If you have diabetes, follow your diabetes management plan as told by your health care provider.  Contact your health care provider if you have problems keeping your blood glucose in your target range. This information is not intended to replace advice given to you by your health care provider. Make sure you discuss any questions you have with your health care provider. Document Revised: 08/09/2016 Document Reviewed: 08/09/2016 Elsevier Patient Education  [facility] Discharge Instructions for  Pacemaker/Defibrillator Patients  Activity No heavy lifting or vigorous activity with your left/right arm for  6 to 8 weeks.  Do not raise your left/right arm above your head for one week.  Gradually raise your affected arm as drawn below.           __      02/18/20                       02/19/20                  02/20/20                  02/21/20  NO DRIVING for  1 week   ; you may begin driving on [id number]    .  WOUND CARE - Keep the wound area clean and dry.  Do not get this area wet for one week. No showers for one week; you may shower on  02/21/20   . - The tape/steri-strips on your wound will fall off; do not pull them off.  No bandage is needed on the site.  DO  NOT apply any creams, oils, or ointments to the wound area. - If you notice any drainage or discharge from the wound, any swelling or bruising at the site, or you develop a fever > 101? F after you are discharged home, call the office at once.  Special Instructions - You are still able to use cellular telephones; use the ear opposite the side where you have your pacemaker/defibrillator.  Avoid carrying your cellular phone near your device. - When traveling through airports, show security personnel your identification card to avoid being screened in the metal detectors.  Ask the security personnel to use the hand wand. - Avoid arc welding equipment, MRI testing (magnetic resonance imaging), TENS units (transcutaneous nerve stimulators).  Call the office for questions about other devices. - Avoid electrical appliances that are in poor condition or are not properly grounded. - Microwave ovens are safe to be near or to operate.

## 2020-02-25 NOTE — Progress Notes (Signed)
Subjective: Feels much better. Some diarrhea, but abdominal pain improving.  Objective: Vital signs in last 24 hours: Temp:  [97.4 F (36.3 C)-99.4 F (37.4 C)] 97.4 F (36.3 C) (03/22 0918) Pulse Rate:  [85-93] 88 (03/22 0918) Resp:  [16-18] 16 (03/22 0631) BP: (131-143)/(69-72) 131/72 (03/22 0918) SpO2:  [100 %] 100 % (03/22 0918) Weight:  [100.5 kg] 100.5 kg (03/22 0631) Weight change:  Last BM Date: 02/24/20  PE: GEN:  Older-appearing than stated age, NAD ABD:  Soft, mild lower abdominal tenderness  Lab Results: CBC    Component Value Date/Time   WBC 12.8 (H) 02/25/2020 0521   RBC 2.84 (L) 02/25/2020 0521   HGB 8.5 (L) 02/25/2020 0521   HGB 13.2 03/17/2017 0000   HCT 26.9 (L) 02/25/2020 0521   HCT 40.5 03/17/2017 0000   PLT 328 02/25/2020 0521   PLT 290 03/17/2017 0000   MCV 94.7 02/25/2020 0521   MCV 95 03/17/2017 0000   MCH 29.9 02/25/2020 0521   MCHC 31.6 02/25/2020 0521   RDW 15.8 (H) 02/25/2020 0521   RDW 13.9 03/17/2017 0000   LYMPHSABS 1.3 02/25/2020 0521   LYMPHSABS 2.1 03/17/2017 0000   MONOABS 0.9 02/25/2020 0521   EOSABS 0.1 02/25/2020 0521   EOSABS 0.1 03/17/2017 0000   BASOSABS 0.0 02/25/2020 0521   BASOSABS 0.0 03/17/2017 0000   CMP     Component Value Date/Time   NA 137 02/25/2020 0521   NA 133 (L) 07/10/2019 1531   K 3.5 02/25/2020 0521   CL 105 02/25/2020 0521   CO2 24 02/25/2020 0521   GLUCOSE 202 (H) 02/25/2020 0521   BUN 6 02/25/2020 0521   BUN 13 07/10/2019 1531   CREATININE 0.80 02/25/2020 0521   CREATININE 0.67 10/21/2016 1630   CALCIUM 8.0 (L) 02/25/2020 0521   CALCIUM 4.3 02/15/2020 1143   PROT 5.9 (L) 02/22/2020 0255   PROT 7.0 10/17/2018 1649   ALBUMIN 1.5 (L) 02/22/2020 0255   ALBUMIN 3.7 10/17/2018 1649   AST 23 02/22/2020 0255   ALT 24 02/22/2020 0255   ALKPHOS 122 02/22/2020 0255   BILITOT 0.6 02/22/2020 0255   BILITOT <0.2 10/17/2018 1649   GFRNONAA >60 02/25/2020 0521   GFRNONAA >89 10/21/2016 1630   GFRAA  >60 02/25/2020 0521   GFRAA >89 10/21/2016 1630   Assessment:  1. Pancreatitis. Unclear etiology. Seems the less pressing of all of her recent medical problems. 2.Diarrhea. 3.Heart failure and heart block; had pacemaker and defibrillator placed last week.  Plan:  1.   OK to discharge on current doses of probiotics and pancreatic enzymes. 2.  OK to use prn use of loperamide. 3.  Patient acceptable for discharge home today from GI perspective. 4.  We will arrange outpatient follow up with [place] in the next several weeks. 5.  [facility] will sign-off; please call us with any further questions; thank you for the consultation.   Landry Dyke 02/25/2020, 11:33 AM   Cell [phone] If no answer or after 5 PM call [phone]

## 2020-02-25 NOTE — Progress Notes (Signed)
Physical Therapy Treatment Patient Details Name: Sarah Long MRN: [id number] DOB: 1971-10-15 Today's Date: 02/25/2020    History of Present Illness 49 y/o female w/ hx of obesity, HTN, HSV-2 infection, Herpes, dizzyness, DM, depression, bipolar 1 disorder, temp pacemaker (02/10/20), pacemake implant (02/13/20). Pt had presented to hospital with c/o 10/10 chest pain and elevated troponin, pt was dx with complete AV block, s/p new pacemaker (3/10)    PT Comments    Pt is demonstrating a good tolerance of gait but is mildly SOB in recovery time afterward.  Her HR and O2 sats are controlled but is feeling potentially low endurance effects from her recent pacemaker insertion and decline of activity preceding.  Pt may benefit from cardiac rehab following up on home therapy plan, which would allow her to build better tolerance for activity in a controlled environment.  Follow acutely to progress her activity as is safe and tolerated.  Follow Up Recommendations  Home health PT     Equipment Recommendations  None recommended by PT    Recommendations for Other Services       Precautions / Restrictions Precautions Precautions: Fall;ICD/Pacemaker;Other (comment) Precaution Comments: fall Restrictions Weight Bearing Restrictions: No LUE Partial Weight Bearing Percentage or Pounds: pacemaker precautions RLE Weight Bearing: Weight bearing as tolerated Other Position/Activity Restrictions: easily SOB    Mobility  Bed Mobility Overal bed mobility: Independent                Transfers Overall transfer level: Modified independent                  Ambulation/Gait Ambulation/Gait assistance: Supervision Gait Distance (Feet): 175 Feet Assistive device: None Gait Pattern/deviations: Wide base of support;Decreased stride length Gait velocity: decreased Gait velocity interpretation: <1.31 ft/sec, indicative of household ambulator General Gait Details: required no rest breaks but  controlled the pace   Chief Strategy Officer    Modified Rankin (Stroke Patients Only)       Balance Overall balance assessment: No apparent balance deficits (not formally assessed)                                          Cognition Arousal/Alertness: Awake/alert Behavior During Therapy: WFL for tasks assessed/performed Overall Cognitive Status: Within Functional Limits for tasks assessed                                        Exercises      General Comments General comments (skin integrity, edema, etc.): talked with pt about mild SOB complaints that she did not have until the end of the walk, consideration for cardiac rehab when HHPT is doen      Pertinent Vitals/Pain Pain Assessment: No/denies pain    Home Living                      Prior Function            PT Goals (current goals can now be found in the care plan section) Progress towards PT goals: Progressing toward goals    Frequency    Min 3X/week      PT Plan Current plan remains appropriate    Co-evaluation  AM-PAC PT "6 Clicks" Mobility   Outcome Measure  Help needed turning from your back to your side while in a flat bed without using bedrails?: None Help needed moving from lying on your back to sitting on the side of a flat bed without using bedrails?: None Help needed moving to and from a bed to a chair (including a wheelchair)?: None Help needed standing up from a chair using your arms (e.g., wheelchair or bedside chair)?: A Little Help needed to walk in hospital room?: A Little Help needed climbing 3-5 steps with a railing? : A Little 6 Click Score: 21    End of Session Equipment Utilized During Treatment: Gait belt Activity Tolerance: Treatment limited secondary to medical complications (Comment) Patient left: in chair;with call bell/phone within reach;with nursing/sitter in room Nurse Communication:  Mobility status PT Visit Diagnosis: Difficulty in walking, not elsewhere classified (R26.2);Pain;Muscle weakness (generalized) (M62.81)     Time: [id number] PT Time Calculation (min) (ACUTE ONLY): 30 min  Charges:  $Gait Training: 8-22 mins $Therapeutic Activity: 8-22 mins                    Ramond Dial 02/25/2020, 3:10 PM  Mee Hives, PT MS Acute Rehab Dept. Number: [place] [facility] [place]

## 2020-02-25 NOTE — Progress Notes (Signed)
Occupational Therapy Treatment Patient Details Name: Sarah Long MRN: [id number] DOB: [birth year] Today's Date: 02/25/2020    History of present illness 49 y/o female w/ hx of obesity, HTN, HSV-2 infection, Herpes, dizzyness, DM, depression, bipolar 1 disorder, temp pacemaker (02/10/20), pacemake implant (02/13/20). Pt had presented to hospital with c/o 10/10 chest pain and elevated troponin, pt was dx with complete AV block, s/p new pacemaker (3/10)   OT comments  Patient progressing well towards OT goals.  Completing transfers and mobility with modified independence today, she fatigues easily with sustained activity but VSS.  Patient provided handout and educated on energy conservation techniques to incorporate into daily routine, discussed community mobility (ie grocery store) and techniques for safety/activity tolerance.  Patient verbalized understanding with all recommendations. Pt reports she will have good support of family at dc.  Will follow acutely.    Follow Up Recommendations  Home health OT;Supervision - Intermittent    Equipment Recommendations  3 in 1 bedside commode;Tub/shower seat;Other (comment)(AE (already issued) )    Recommendations for Other Services      Precautions / Restrictions Precautions Precautions: Fall;ICD/Pacemaker;Other (comment) Restrictions LUE Partial Weight Bearing Percentage or Pounds: pacemaker precautions       Mobility Bed Mobility Overal bed mobility: Independent             General bed mobility comments: no assist required, HOB flat   Transfers Overall transfer level: Needs assistance Equipment used: None             General transfer comment: independent     Balance Overall balance assessment: No apparent balance deficits (not formally assessed)                                         ADL either performed or assessed with clinical judgement   ADL Overall ADL's : Needs assistance/impaired     Grooming:  Independent;Standing                   Toilet Transfer: Independent;Ambulation           Functional mobility during ADLs: Modified independent General ADL Comments: pt verbalizes using long sponge for bathing, denies need to practice sock aide/reacher; remains limited by decreased activity tolerance, session focused on energy conservation education      Vision       Perception     Praxis      Cognition Arousal/Alertness: Awake/alert Behavior During Therapy: WFL for tasks assessed/performed Overall Cognitive Status: Within Functional Limits for tasks assessed                                          Exercises     Shoulder Instructions       General Comments educated on energy conservation techniques to incoporate into daily routine, pt verbalizes understanding     Pertinent Vitals/ Pain       Pain Assessment: Faces Faces Pain Scale: No hurt  Home Living                                          Prior Functioning/Environment              Frequency  Min 2X/week        Progress Toward Goals  OT Goals(current goals can now be found in the care plan section)  Progress towards OT goals: Progressing toward goals  Acute Rehab OT Goals Patient Stated Goal: home today!  OT Goal Formulation: With patient  Plan Discharge plan remains appropriate;Frequency remains appropriate    Co-evaluation                 AM-PAC OT "6 Clicks" Daily Activity     Outcome Measure   Help from another person eating meals?: None Help from another person taking care of personal grooming?: None Help from another person toileting, which includes using toliet, bedpan, or urinal?: None Help from another person bathing (including washing, rinsing, drying)?: None Help from another person to put on and taking off regular upper body clothing?: None Help from another person to put on and taking off regular lower body clothing?: None 6  Click Score: 24    End of Session    OT Visit Diagnosis: Unsteadiness on feet (R26.81);Muscle weakness (generalized) (M62.81)   Activity Tolerance Patient tolerated treatment well   Patient Left with call bell/phone within reach;Other (comment)(seated EOB )   Nurse Communication Mobility status        Time: [id number] OT Time Calculation (min): 27 min  Charges: OT General Charges $OT Visit: 1 Visit OT Treatments $Self Care/Home Management : 8-22 mins $Therapeutic Activity: 8-22 mins  Jolaine Artist, OT Acute Rehabilitation Services Pager [phone] Office [phone]     Delight Stare 02/25/2020, 9:55 AM

## 2020-02-25 NOTE — Progress Notes (Addendum)
PROGRESS NOTE    Sarah Long  [id number]  DOB: Apr 01, 1971  PCP: [name], MD Admit date:02/09/2020  49 yr old obese F w/ significant PMHx alcohol abuse, tobacco abuse, IDDM, HTN, HLD and morbid obesity presented to [facility] with 10/10 chest pain. Per the triage note pt reported vomiting all day, being short of breath and having severe chest pain feeling near syncopal, although she denied loss of consciousness or actual syncopal episode.  ED Course: Afebrile, BP 133/59mmHg HR 41 RR 26 O2 sat 94% . CBG>600mg /dl. Bicarb 8. AG 30. Na 131. K 5.3. Cr 2.85. Lipase 1879 AST 582. ALT 294. EKG showed an Idioventricular rhythm with LAD, AVB, LVH and an inferior infarct (QRS 162 ms and QT/QTc 574/467 ms). Troponin 730>>2000.  Chest x-ray showed mild interstitial prominence suggesting pulmonary edema, low lung volumes.  Patient went into complete AVB with HR of 39 while in ED requiring external pacer. Done she becomes somnolent after IV Versed requiring intubation. Hospital course: Cardiology consulted and she was admitted to ICU. She was also started on Unasyn for possible aspiration pneumonia.Patient underwent LHC on 3/7 which was negative for CAD. She had temporary venous pacemaker advanced at the same time. Echo with EF of 40 to 45%, global hypokinesis, severe LVH and indeterminate diastolic function but no other significant finding. Patient was extubated on 02/11/2020.She eventually had permanent PPM/ICD implant on 02/13/2020.Patient also received evaluation/treatment for acute pancreatitis, transaminitis, rhabdomyolysis, significant hypocalcemia/vitamin D deficiency and iron deficiency anemia while in ICU.  Patient transferred to [facility] service on 3/11.  Cardiac issues been stable.  Patient however has continued to complain of postprandial abdominal bloating, frequent diarrhea associated with worsening leukocytosis on lab work.  Repeat CT on 3/16 showed persistent/possible worsening of  pancreatitis and started on Rocephin/Flagyl.  GI consulted and leukocytosis has been downtrending on antibiotics.  She has also had generalized edema during the hospital course with echo findings as above.   Subjective:  Patient resting comfortably. States was staying at a hotel prior to coming to the hospital and plans to return there on discharge. Still feels somewhat edematous, saturating well on RA  Objective: Vitals:   02/24/20 1650 02/24/20 2155 02/25/20 0631 02/25/20 0918  BP:  (!) 143/70 137/69 131/72  Pulse: 85 93 89 88  Resp:  18 16   Temp: 98.4 F (36.9 C) 99.4 F (37.4 C) 98.7 F (37.1 C) (!) 97.4 F (36.3 C)  TempSrc: Oral Oral Oral Oral  SpO2: 100% 100% 100% 100%  Weight:   100.5 kg   Height:        Intake/Output Summary (Last 24 hours) at 02/25/2020 1631 Last data filed at 02/25/2020 0644 Gross per 24 hour  Intake 1110.24 ml  Output --  Net 1110.24 ml   Filed Weights   02/22/20 0415 02/24/20 0701 02/25/20 0631  Weight: 99.8 kg 99.4 kg 100.5 kg    Physical Examination:  General exam: Appears calm and comfortable  Respiratory system: Clear to auscultation. Respiratory effort normal. Cardiovascular system: S1 & S2 heard, RRR. No JVD, murmurs, rubs, gallops or clicks. 1+ pitting leg edema  Gastrointestinal system: Abdomen is nondistended, soft and nontender. Normal bowel sounds heard. Central nervous system: Alert and oriented. No new focal neurological deficits. Extremities: No contractures, edema or joint deformities. 1+ pitting leg edema and along dorsum of hands left >right. Skin: No rashes, lesions or ulcers Psychiatry: Judgement and insight appear normal. Mood & affect appropriate.   Data Reviewed: I have personally reviewed  following labs and imaging studies  CBC: Recent Labs  Lab 02/21/20 0331 02/22/20 0255 02/23/20 0309 02/24/20 0657 02/25/20 0521  WBC 16.0* 16.1* 15.3* 14.3* 12.8*  NEUTROABS 13.1* 13.4* 12.4* 11.6* 10.3*  HGB 8.5* 8.3*  8.6* 8.6* 8.5*  HCT 26.6* 26.3* 26.7* 27.5* 26.9*  MCV 92.4 93.9 92.4 93.5 94.7  PLT 197 254 282 310 [id number]   Basic Metabolic Panel: Recent Labs  Lab 02/19/20 0237 02/19/20 0237 02/21/20 0331 02/22/20 0255 02/23/20 0309 02/24/20 0657 02/25/20 0521  NA 134*   < > 133* 133* 135 136 137  K 3.6   < > 3.9 3.4* 3.2* 3.4* 3.5  CL 105   < > 104 103 103 103 105  CO2 20*   < > 20* 20* 23 24 24   GLUCOSE 229*   < > 300* 246* 309* 258* 202*  BUN 8   < > 8 7 6 6 6   CREATININE 1.00   < > 0.98 0.90 1.03* 0.83 0.80  CALCIUM 5.9*   < > 7.1* 7.4* 7.7* 7.9* 8.0*  MG 1.5*   < > 1.7 1.6* 1.8 1.7 1.7  PHOS 1.9*  --   --   --   --  2.9  --    < > = values in this interval not displayed.   GFR: Estimated Creatinine Clearance: 101.3 mL/min (by C-G formula based on SCr of 0.8 mg/dL). Liver Function Tests: Recent Labs  Lab 02/19/20 0237 02/21/20 0331 02/22/20 0255  AST  --  22 23  ALT  --  29 24  ALKPHOS  --  112 122  BILITOT  --  0.5 0.6  PROT  --  6.1* 5.9*  ALBUMIN 1.6* 1.5* 1.5*   Recent Labs  Lab 02/21/20 0331 02/22/20 0255 02/23/20 0309 02/24/20 0657 02/25/20 0521  LIPASE 208* 158* 164* 147* 125*   No results for input(s): AMMONIA in the last 168 hours. Coagulation Profile: Recent Labs  Lab 02/22/20 0255  INR 1.4*   Cardiac Enzymes: Recent Labs  Lab 02/22/20 0255  CKTOTAL 51   BNP (last 3 results) No results for input(s): PROBNP in the last 8760 hours. HbA1C: No results for input(s): HGBA1C in the last 72 hours. CBG: Recent Labs  Lab 02/24/20 1129 02/24/20 1646 02/24/20 2159 02/25/20 0811 02/25/20 1220  GLUCAP 220* 227* 260* 182* 162*   Lipid Profile: Recent Labs    02/23/20 0309  CHOL 53  HDL 19*  LDLCALC 17  TRIG 84  CHOLHDL 2.8   Thyroid Function Tests: No results for input(s): TSH, T4TOTAL, FREET4, T3FREE, THYROIDAB in the last 72 hours. Anemia Panel: No results for input(s): VITAMINB12, FOLATE, FERRITIN, TIBC, IRON, RETICCTPCT in the last 72  hours. Sepsis Labs: No results for input(s): PROCALCITON, LATICACIDVEN in the last 168 hours.  Recent Results (from the past 240 hour(s))  OVA + PARASITE EXAM     Status: None   Collection Time: 02/20/20  1:16 AM   Specimen: Stool  Result Value Ref Range Status   OVA + PARASITE EXAM Final report  Final    Comment: (NOTE) These results were obtained using wet preparation(s) and trichrome stained smear. This test does not include testing for Cryptosporidium parvum, Cyclospora, or Microsporidia. Performed At: [facility], [place] [id number] Caprice Red MD [id number]    Source of Sample STOOL  Final  Stool culture (children & immunocomp patients)     Status: None   Collection Time: 02/20/20  1:16  AM   Specimen: Stool  Result Value Ref Range Status   Salmonella/Shigella Screen Final report  Final   Campylobacter Culture Final report  Final   E coli, Shiga toxin Assay Negative Negative Final    Comment: (NOTE) Performed At: [facility] [place], [place] [id number] Rush Farmer MD [id number]   GI pathogen panel by PCR, stool     Status: None   Collection Time: 02/20/20  1:16 AM   Specimen: Stool  Result Value Ref Range Status   Plesiomonas shigelloides NOT DETECTED NOT DETECTED Final   Yersinia enterocolitica NOT DETECTED NOT DETECTED Final   Vibrio NOT DETECTED NOT DETECTED Final   Enteropathogenic E coli NOT DETECTED NOT DETECTED Final   E coli (ETEC) LT/ST NOT DETECTED NOT DETECTED Final   E coli [id number] by PCR Not applicable NOT DETECTED Final   Cryptosporidium by PCR NOT DETECTED NOT DETECTED Final   Entamoeba histolytica NOT DETECTED NOT DETECTED Final   Adenovirus F 40/41 NOT DETECTED NOT DETECTED Final   Norovirus GI/GII NOT DETECTED NOT DETECTED Final   Sapovirus NOT DETECTED NOT DETECTED Final    Comment: (NOTE) Performed At: [facility] [place], [place] [id number] Rush Farmer MD [id number]    Vibrio cholerae NOT DETECTED NOT DETECTED Final   Campylobacter by PCR NOT DETECTED NOT DETECTED Final   Salmonella by PCR NOT DETECTED NOT DETECTED Final   E coli (STEC) NOT DETECTED NOT DETECTED Final   Enteroaggregative E coli NOT DETECTED NOT DETECTED Final   Shigella by PCR NOT DETECTED NOT DETECTED Final   Cyclospora cayetanensis NOT DETECTED NOT DETECTED Final   Astrovirus NOT DETECTED NOT DETECTED Final   G lamblia by PCR NOT DETECTED NOT DETECTED Final   Rotavirus A by PCR NOT DETECTED NOT DETECTED Final  STOOL CULTURE REFLEX - RSASHR     Status: None   Collection Time: 02/20/20  1:16 AM  Result Value Ref Range Status   Stool Culture result 1 (RSASHR) Comment  Final    Comment: (NOTE) No Salmonella or Shigella recovered. Performed At: [facility] [name] [place], [place] [id number] Rush Farmer MD [id number]   STOOL CULTURE Reflex - CMPCXR     Status: None   Collection Time: 02/20/20  1:16 AM  Result Value Ref Range Status   Stool Culture result 1 (CMPCXR) Comment  Final    Comment: (NOTE) No Campylobacter species isolated. Performed At: [facility] [place], [place] [id number] Rush Farmer MD [id number]   C difficile quick scan w PCR reflex     Status: None   Collection Time: 02/22/20 11:39 AM   Specimen: STOOL  Result Value Ref Range Status   C Diff antigen NEGATIVE NEGATIVE Final   C Diff toxin NEGATIVE NEGATIVE Final   C Diff interpretation No C. difficile detected.  Final    Comment: Performed at [facility], [place] [address], [place], [place] [address]      Radiology Studies: No results found.      Scheduled Meds: . aspirin  81 mg Oral Daily  . Chlorhexidine Gluconate Cloth  6 each Topical Daily  . cholecalciferol  2,000 Units Oral Daily  . enoxaparin (LOVENOX) injection  40 mg Subcutaneous Q24H  . feeding supplement  1 Container Oral QID  . ferrous sulfate  325 mg  Oral BID WC  . folic acid  1 mg Oral Daily  . insulin aspart  0-5 Units  Subcutaneous QHS  . insulin aspart  0-6 Units Subcutaneous TID WC  . insulin aspart  3 Units Subcutaneous TID WC  . insulin glargine  22 Units Subcutaneous BID  . lipase/protease/amylase  24,000 Units Oral TID WC  . metroNIDAZOLE  500 mg Oral Q8H  . nystatin  5 mL Oral QID  . pantoprazole  40 mg Oral Daily  . potassium chloride  40 mEq Oral BID  . saccharomyces boulardii  250 mg Oral BID  . sodium bicarbonate  650 mg Oral TID  . sodium chloride flush  10-40 mL Intracatheter Q12H  . sodium chloride flush  3 mL Intravenous Q12H  . Vitamin D (Ergocalciferol)  50,000 Units Oral Q7 days   Continuous Infusions: . sodium chloride Stopped (02/13/20 0820)  . sodium chloride    . sodium chloride 250 mL (02/22/20 0004)  . cefTRIAXone (ROCEPHIN)  IV 2 g (02/24/20 2236)    Assessment/Plan: Persistent pancreatitis/ab pain/watery diarrhea-Leukocytosis got worse on 3/16 with persistent diarrhea , stool study negative for cdiff, stool PCR panel negative , stool cultures negative for E. Coli/Campylobacter/Shigella/Salmonella etc. Repeat CT scan done on 3/16 showed worsening of pancreatitis- started on Cipro and Flagyl, seen by [facility], Dr. Paulita Fujita and started on pancrelipase supplement.  WBC count and lipase trending down,  diet advanced to soft and regular diet yesterday. Will complete 5 days abx tonight. Leucocytosis downtrending. Cleared by GI to discharge on current dose of probiotic/pancreatic enzymes.  Okay to use as needed loperamide.  Follow-up [facility] clinic in few weeks.  Anasarca: Patient noted to be clinically edematous with fluid overload during the hospital course.  She had net positive 18.7 L since admission per I's and O's documentation.  S/p 1 dose of Lasix on 3/17, x1 on 3/18, x1 on 3/19.  Improving ,encourage ambulation.  Nutrition supplement.DC sodium bicarb tablets to avoid salt/fluid retention. Start oral lasix  and monitor response. She probably will need low dose maintenance therapy.   Non-STEMI,Complete Heart Block/bifascicular block (RBBB and LAFB)  : Patient presented with severe chest pain and noted to be in complete heart block.  Cardiac cath with no acute blockages.  She is now s/p PPM and ICD on 02/13/2020. Device interrogation normal. -Cardiology  followed up on patient today, Steri-Strips removed.  Wound healing well with no signs of redness, discharge or edema.  Cardiology cleared for discharge and stated okay to drive.  Okay for bath/shower but advised no lotions, creams or ointments or powders on device site.  Acute systolic CHF with acute hypoxic respiratory failure: Present on admission with chest x-ray suggestive of cardiomegaly/pulmonary edema.  Echo on 3/7with LVEF of 40 to 45%, global hypokinesis, severe LVH.  Required intubation in the ED.  Patient received significant amount of IV fluids for pancreatitis/rhabdomyolysis initially and then received IV diuretics, now saturating well on room air.  DC sodium bicarb tablets to avoid salt/fluid retention. Start oral lasix and monitor response.   Uncontrolled insulin dependent DM-2 with DKA: Present on admission.  Patient was struggling to get insulin supplies prior to admission.  Presented with DKA that has resolved. A1c 12.7%. Hospital course complicated by hypoglycemic as well as hyperglycemic episodes.C-peptide and insulin level within normal range.Currently on Lantus 22 units twice a day, meal coverage and sliding scales with blood glucose well controlled, less than 200.  Seen by diabetes educator and follow-up with [place] and wellness advised.  AKI-multifactorial including cardiorenal, rhabdoand iatrogenic from ARB/HCTZ and NSAIDs Now resolved.  Avoid nephrotoxins.  DC sodium bicarb tablets as metabolic acidosis now resolved. Monitor on oral lasix  Hypokalemia/hypomagnesemia/hypophosphatemia-replaced as needed throughout the  hospital course. Currently on scheduled potassium 86meq BID but potassium still only at 3.5, now also started on oral lasix--labs in am.   Hypocalcemia/vitamin D deficiency: Calcium 4.8. Multifactorial including vitamin D deficiency (<4.2), rhabdo and acute pancreatitis. PTH is appropriately high. Urine calcium appropriately low at 4.3.-s/p Calcium gluconate 3 g.  Continue P.o. vitamin D 50,000 IU weekly and 2000 daily-suspect poor observation in the setting of acute pancreatitis.  Calcium level now improving, today is at 8.0, with last albumin level at 1.5 on 3/19.  QTc prolongation-monitored on telemetry during the hospital course, did have arrhythmias/electrolyte abnormalities as explained above, keep mag above 2 (currently at 1.7), potassium close to 4 (currently at 3.5).  Avoid QT prolonging agents, change Cipro to Rocephin.  QTC now down from 619 to 526  Rhabdomyolysis: Due to alcohol? Statin? CK [phone]. CK-MB low.  Received IV fluids during the hospital course.  Statins held.CK normalized now  Alcohol use disorder:Reportedly drinks about 5-6 drinks including liquors.  Counseled to quit. -Consulted TOC for resources  Tobacco use disorder: Smokes about a pack a day.-Cessation counseling.  Nicotine patch while here  Obesity: Body mass index is 37.63 kg/m.  Counseled regarding risks and diet modifications.  Homelessness: Patient reported that she is homeless, she has no insurance ,she and her husband currently live in a hotel which they probably cannot afford for too long.  Social worker consulted.   DVT prophylaxis: Lovenox Code Status: Full code Family / Patient Communication:  Disposition Plan:   Patient homeless and was living in a hotel prior to hospitalization. Received/Receiving inpatient care for multiple medical, primarily cardiac/GI issues as outlined above.  She has also been treated for multiple electrolyte abnormalities, DKA. Discharge plan: Will  replace electrolytes-p.o. potassium and IV magnesium to keep levels close to 4 and 2 respectively.  Will need to follow-up with [place] and wellness clinic regarding further insulin supplies.  PT/OT cleared for discharge home with home health services.  DME-3 in 1, shower stool arranged through case manager.  Patient should follow-up with PCP regarding repeat lab work      LOS: 15 days    Time spent: 35 minutes    Guilford Shi, MD [facility] Pager in [place]  If 7PM-7AM, please contact night-coverage [email or web address] 02/25/2020, 4:31 PM

## 2020-02-25 NOTE — TOC Progression Note (Signed)
Transition of Care [facility]) - Progression Note    Patient Details  Name: Sarah Long MRN: [id number] Date of Birth: 1971-06-08  [facility] CM/SW Contact  Zenon Mayo, RN Phone Number: 02/25/2020, 1:00 PM  Clinical Narrative:    NCM made referral to [place] with [employer] for 3 n 1 for chartiy, she can also use this as a shower chair.  Thedore Mins will bring this up to patient's room.    Expected Discharge Plan: [place] [facility]) Barriers to Discharge: Continued Medical Work up  Expected Discharge Plan and Services Expected Discharge Plan: [place] [facility]) In-house Referral: NA Discharge Planning Services: CM Consult Post Acute Care Choice: [place] arrangements for the past 2 months: Single Family Home                 DME Arranged: 3-N-1, Shower stool DME Agency: [employer] Date DME Agency Contacted: 02/19/20 Time DME Agency Contacted: 31 Representative spoke with at DME Agency: [place]: PT, OT [place] Agency: [place] ([place]) Date [place]: 02/19/20 Time [place]: 0900     Social Determinants of Health (SDOH) Interventions    Readmission Risk Interventions No flowsheet data found.

## 2020-02-25 NOTE — Progress Notes (Signed)
Inpatient Diabetes Program Recommendations  AACE/ADA: New Consensus Statement on Inpatient Glycemic Control (2015)  Target Ranges:  Prepandial:   less than 140 mg/dL      Peak postprandial:   less than 180 mg/dL (1-2 hours)      Critically ill patients:  140 - 180 mg/dL   Lab Results  Component Value Date   GLUCAP 162 (H) 02/25/2020   HGBA1C 12.7 (H) 02/11/2020    Review of Glycemic Control Results for Sarah Long, Sarah Long (MRN [id number]) as of 02/25/2020 14:43  Ref. Range 02/24/2020 16:46 02/24/2020 21:59 02/25/2020 08:11 02/25/2020 12:20  Glucose-Capillary Latest Ref Range: 70 - 99 mg/dL 227 (H) 260 (H) 182 (H) 162 (H)    Spoke with patient again to review diabetes management prior to discharge.  Reviewed patient's current A1c of 12.7%. Explained what a A1c is and what it measures. Also reviewed goal A1c with patient, importance of good glucose control @ home, and blood sugar goals. Reviewed patho of DM, need for insulin, role of pancreas, impact of glucose with vascularity and risk for future event with poor glycemic control, and other co-morbidities.  Patient struggled to get insulin prior to this event and had been working with a pharmacist regarding orange card denial. Has follow up with [facility] and denies transportation issues at this time. Patient states she is able to afford medications at [facility]. Also, reviewed [facility] information and will attach to DC summary.  Reports drinking large amounts of water and denies sugary beverages. Reviewed plate method, and encouraged mindfulness of carbohydrate intake. Also, reviewed recommendations per ADA regarding activity. Encouraged to increase as she feels she is able.  Has no further questions at this time.   Thanks, Bronson Curb, MSN, RNC-OB Diabetes Coordinator [phone] (8a-5p)

## 2020-02-26 DIAGNOSIS — I5042 Chronic combined systolic (congestive) and diastolic (congestive) heart failure: Secondary | ICD-10-CM

## 2020-02-26 DIAGNOSIS — R9431 Abnormal electrocardiogram [ECG] [EKG]: Secondary | ICD-10-CM

## 2020-02-26 DIAGNOSIS — I5043 Acute on chronic combined systolic (congestive) and diastolic (congestive) heart failure: Secondary | ICD-10-CM

## 2020-02-26 LAB — CBC WITH DIFFERENTIAL/PLATELET
Abs Immature Granulocytes: 0.08 10*3/uL — ABNORMAL HIGH (ref 0.00–0.07)
Basophils Absolute: 0 10*3/uL (ref 0.0–0.1)
Basophils Relative: 0 %
Eosinophils Absolute: 0.1 10*3/uL (ref 0.0–0.5)
Eosinophils Relative: 1 %
HCT: 25.6 % — ABNORMAL LOW (ref 36.0–46.0)
Hemoglobin: 8 g/dL — ABNORMAL LOW (ref 12.0–15.0)
Immature Granulocytes: 1 %
Lymphocytes Relative: 14 %
Lymphs Abs: 1.6 10*3/uL (ref 0.7–4.0)
MCH: 29.3 pg (ref 26.0–34.0)
MCHC: 31.3 g/dL (ref 30.0–36.0)
MCV: 93.8 fL (ref 80.0–100.0)
Monocytes Absolute: 0.7 10*3/uL (ref 0.1–1.0)
Monocytes Relative: 7 %
Neutro Abs: 8.3 10*3/uL — ABNORMAL HIGH (ref 1.7–7.7)
Neutrophils Relative %: 77 %
Platelets: 344 10*3/uL (ref 150–400)
RBC: 2.73 MIL/uL — ABNORMAL LOW (ref 3.87–5.11)
RDW: 15.9 % — ABNORMAL HIGH (ref 11.5–15.5)
WBC: 10.8 10*3/uL — ABNORMAL HIGH (ref 4.0–10.5)
nRBC: 0 % (ref 0.0–0.2)

## 2020-02-26 LAB — GLUCOSE, CAPILLARY
Glucose-Capillary: 93 mg/dL (ref 70–99)
Glucose-Capillary: 140 mg/dL — ABNORMAL HIGH (ref 70–99)

## 2020-02-26 LAB — BASIC METABOLIC PANEL
Anion gap: 10 (ref 5–15)
BUN: 5 mg/dL — ABNORMAL LOW (ref 6–20)
CO2: 23 mmol/L (ref 22–32)
Calcium: 8 mg/dL — ABNORMAL LOW (ref 8.9–10.3)
Chloride: 104 mmol/L (ref 98–111)
Creatinine, Ser: 0.76 mg/dL (ref 0.44–1.00)
GFR calc Af Amer: >60 mL/min (ref >60)
GFR calc non Af Amer: >60 mL/min (ref >60)
Glucose, Bld: 120 mg/dL — ABNORMAL HIGH (ref 70–99)
Potassium: 3.5 mmol/L (ref 3.5–5.1)
Sodium: 137 mmol/L (ref 135–145)

## 2020-02-26 LAB — MAGNESIUM
Magnesium: 1.6 mg/dL — ABNORMAL LOW (ref 1.7–2.4)
Magnesium: 1.8 mg/dL (ref 1.7–2.4)

## 2020-02-26 LAB — LIPASE, BLOOD: Lipase: 115 U/L — ABNORMAL HIGH (ref 11–51)

## 2020-02-26 MED ORDER — VITAMIN D (ERGOCALCIFEROL) 1.25 MG (50000 UNIT) PO CAPS: 50000.0000 [IU] | ORAL_CAPSULE | ORAL | 1 refills | Status: DC

## 2020-02-26 MED ORDER — PANCRELIPASE (LIP-PROT-AMYL) 24000-76000 UNITS PO CPEP: 24000.0000 [IU] | ORAL_CAPSULE | Freq: Three times a day (TID) | ORAL | 1 refills | Status: DC

## 2020-02-26 MED ORDER — NOVOLOG FLEXPEN 100 UNIT/ML ~~LOC~~ SOPN: 3.0000 [IU] | PEN_INJECTOR | Freq: Three times a day (TID) | SUBCUTANEOUS | 11 refills | Status: DC

## 2020-02-26 MED ORDER — SACCHAROMYCES BOULARDII 250 MG PO CAPS: 250.0000 mg | ORAL_CAPSULE | Freq: Two times a day (BID) | ORAL | 1 refills | Status: DC

## 2020-02-26 MED ORDER — POTASSIUM CHLORIDE CRYS ER 20 MEQ PO TBCR: 40.0000 meq | EXTENDED_RELEASE_TABLET | Freq: Every day | ORAL | 1 refills | Status: DC

## 2020-02-26 MED ORDER — MAGNESIUM OXIDE 400 (241.3 MG) MG PO TABS: 400.0000 mg | ORAL_TABLET | Freq: Every day | ORAL | 0 refills | Status: DC

## 2020-02-26 MED ORDER — FAMOTIDINE 20 MG PO TABS: 20.0000 mg | ORAL_TABLET | Freq: Two times a day (BID) | ORAL | 1 refills | Status: DC

## 2020-02-26 MED ORDER — SIMETHICONE 80 MG PO CHEW: 80.0000 mg | CHEWABLE_TABLET | Freq: Four times a day (QID) | ORAL | 0 refills | Status: DC | PRN

## 2020-02-26 MED ORDER — FOLIC ACID 1 MG PO TABS: 1.0000 mg | ORAL_TABLET | Freq: Every day | ORAL | 1 refills | Status: DC

## 2020-02-26 MED ORDER — PEN NEEDLES 31G X 8 MM MISC: 1 refills | Status: DC

## 2020-02-26 MED ORDER — ATORVASTATIN CALCIUM 40 MG PO TABS: 40.0000 mg | ORAL_TABLET | Freq: Every day | ORAL | 3 refills | Status: DC

## 2020-02-26 MED ORDER — DICYCLOMINE HCL 10 MG PO CAPS: 10.0000 mg | ORAL_CAPSULE | Freq: Three times a day (TID) | ORAL | 0 refills | Status: DC

## 2020-02-26 MED ORDER — MAGNESIUM SULFATE 2 GM/50ML IV SOLN
2.0000 g | Freq: Once | INTRAVENOUS | Status: AC
  Administered 2020-02-26: 2 g via INTRAVENOUS
  Filled 2020-02-26: qty 50

## 2020-02-26 MED ORDER — BASAGLAR KWIKPEN 100 UNIT/ML ~~LOC~~ SOPN: 22.0000 [IU] | PEN_INJECTOR | Freq: Two times a day (BID) | SUBCUTANEOUS | 2 refills | Status: DC

## 2020-02-26 MED ORDER — FERROUS SULFATE 325 (65 FE) MG PO TABS: 325.0000 mg | ORAL_TABLET | Freq: Two times a day (BID) | ORAL | 3 refills | Status: DC

## 2020-02-26 MED ORDER — OXYCODONE HCL 5 MG PO TABS
5.0000 mg | ORAL_TABLET | ORAL | Status: DC | PRN
  Administered 2020-02-26: 10 mg via ORAL
  Filled 2020-02-26: qty 2

## 2020-02-26 MED ORDER — NYSTATIN 100000 UNIT/ML MT SUSP: 5.0000 mL | Freq: Four times a day (QID) | OROMUCOSAL | 0 refills | Status: DC

## 2020-02-26 MED ORDER — MAGNESIUM SULFATE IN D5W 1-5 GM/100ML-% IV SOLN
1.0000 g | Freq: Once | INTRAVENOUS | Status: DC
  Filled 2020-02-26: qty 100

## 2020-02-26 MED ORDER — FUROSEMIDE 20 MG PO TABS: ORAL_TABLET | ORAL | 1 refills | Status: DC

## 2020-02-26 MED ORDER — VITAMIN D3 25 MCG PO TABS: 2000.0000 [IU] | ORAL_TABLET | Freq: Every day | ORAL | 1 refills | Status: DC

## 2020-02-26 MED ORDER — FUROSEMIDE 20 MG PO TABS: 20.0000 mg | ORAL_TABLET | Freq: Every day | ORAL | 1 refills | Status: DC | PRN

## 2020-02-26 MED ORDER — MAGNESIUM OXIDE 400 (241.3 MG) MG PO TABS
400.0000 mg | ORAL_TABLET | Freq: Every day | ORAL | Status: DC
  Administered 2020-02-26: 10:00:00 400 mg via ORAL
  Filled 2020-02-26: qty 1

## 2020-02-26 MED FILL — NOVOLOG FLEXPEN SYRINGE: 100 | 20 days supply | Qty: 12 | Fill #0

## 2020-02-26 MED FILL — NYSTATIN 100000 UNIT/ML SUS: 100000 | 3 days supply | Qty: 60 | Fill #0

## 2020-02-26 MED FILL — MAGNESIUM OXIDE 400 MG TABS: 400 | 30 days supply | Qty: 30 | Fill #0

## 2020-02-26 MED FILL — FAMOTIDINE 20 MG TABS: 20 | 30 days supply | Qty: 60 | Fill #0

## 2020-02-26 MED FILL — FUROSEMIDE 20 MG TAB: 20 | 30 days supply | Qty: 30 | Fill #0

## 2020-02-26 MED FILL — CREON DR 12,000 UNITS CAP: 12000 | 16 days supply | Qty: 100 | Fill #0

## 2020-02-26 MED FILL — FOLIC ACID 1 MG TABS: 1 | 30 days supply | Qty: 30 | Fill #0

## 2020-02-26 MED FILL — BASAGLAR 100 UNIT/ML KWIKPE: 100 | 32 days supply | Qty: 15 | Fill #0

## 2020-02-26 MED FILL — VIT D2 1.25 MG (50,000 UNIT: 1.25 MG | 31 days supply | Qty: 5 | Fill #0

## 2020-02-26 MED FILL — PENTIPS 31G X 8 MM MISC: 31G X 8 MM | 25 days supply | Qty: 100 | Fill #0

## 2020-02-26 MED FILL — POTASSIUM CHLORIDE 20meqER: 20 | 30 days supply | Qty: 60 | Fill #0

## 2020-02-26 MED FILL — DICYCLOMINE HCL 10 MG CAPS: 10 | 7 days supply | Qty: 21 | Fill #0

## 2020-02-26 NOTE — Discharge Summary (Addendum)
Physician Discharge Summary  Sarah Long UTM:[id number] DOB: [birth year] DOA: 02/09/2020  PCP: [name], MD  Admit date: 02/09/2020 Discharge date: 02/26/2020 Consultations: Cardiology, [facility] Admitted From: home Disposition: home  Discharge Diagnoses:  Principal Problem:   Complete heart block ([place]) Active Problems:   Acute pancreatitis without infection or necrosis   Diabetic ketoacidosis without coma associated with type 2 diabetes mellitus (HCC)   Elevated troponin   NSTEMI (non-ST elevated myocardial infarction) (HCC)   Lactic acid acidosis   Endotracheally intubated   ACS (acute coronary syndrome) (HCC)   Prolonged QT interval   Acute on chronic combined systolic and diastolic CHF (congestive heart failure) [facility])   Hospital Course Summary: 49 yr old obese F w/ significant PMHx alcohol abuse, tobacco abuse, IDDM, HTN, HLD and morbid obesity presented to [facility] with 10/10 chest pain. Per the triage note pt reported vomiting all day, being short of breath and having severe chest pain feeling near syncopal, although she denied loss of consciousness or actual syncopal episode.  ED Course: Afebrile, BP 133/82mHg HR 41 RR 26 O2 sat 94% . CBG>6030mdl. Bicarb 8. AG 30. Na 131. K 5.3. Cr 2.85. Lipase 1879 AST 582. ALT 294. EKG showed an Idioventricular rhythm with LAD, AVB, LVH and an inferior infarct (QRS 162 ms and QT/QTc 574/467 ms). Troponin 730>>2000.  Chest x-ray showed mild interstitial prominence suggesting pulmonary edema, low lung volumes.  Patient went into complete AVB with HR of 39 while in ED requiring external pacer. Done she becomes somnolent after IV Versed requiring intubation. Hospital course: Cardiology consulted and she was admitted to ICU. She was also started on Unasyn for possible aspiration pneumonia.Patient underwent LHC on 3/7 which was negative for CAD. She had temporary venous pacemaker advanced at the same time. Echo with EF of 40 to 45%, global  hypokinesis, severe LVH and indeterminate diastolic function but no other significant finding. Patient was extubated on 02/11/2020.She eventually had permanent PPM/ICD implant on 02/13/2020.Patient also received evaluation/treatment for acute pancreatitis, transaminitis, rhabdomyolysis, significant hypocalcemia/vitamin D deficiency and iron deficiency anemia while in ICU.  [facility]  Cardiac issues been stable.  Patient however has continued to complain of postprandial abdominal bloating, frequent diarrhea associated with worsening leukocytosis on lab work.  Repeat CT on 3/16 showed persistent/possible worsening of pancreatitis and started on Rocephin/Flagyl.  GI consulted and leukocytosis has been downtrending on antibiotics.  She has also had generalized edema during the hospital course with echo findings as above.  Acute pancreatitis with abdominal pain and watery diarrhea-Leukocytosis got worse on 3/16 with persistent diarrhea , stool study negative for cdiff, stool PCR panel negative , stool cultures negative for E. Coli/Campylobacter/Shigella/Salmonella etc. Repeat CT scan done on 3/16 showed worsening of pancreatitis- started on Cipro and Flagyl, seen by [facility], Dr. OuPaulita Fujitand started on pancrelipase supplement. She completed 5 days antibiotic while here. WBC count andlipase trending down, diet advanced to soft and regular diet which she has been tolerating well >48 hrs.Cleared by GI to discharge on current dose of probiotic/pancreatic enzymes.  Okay to use as needed loperamide but patient no longer has diarrhea and dicontinued this medication in concern for prolonged qtc. She was receiving IV fentanyl for abdominal pain during the hospitals course. Changed to oral meds today. Reports cramping pain when having BM or on eating, will discharge on Bentyl prn.  Follow-up [facility], Dr OuPaulita Fujitain few weeks.  Anasarca: Patient noted to be clinically edematous  with  fluid overload during the hospital course.  She had net positive 18.7 L since admission per I's and O's documentation.  S/p 1 dose of Lasix on 3/17, x1 on 3/18, x1 on 3/19.  Improving ,encourage ambulation.  Nutrition supplement.Discontinued  sodium bicarb tablets that she has been recieveing to avoid salt/fluid retention. Started on oral lasix yesterday which she is tolerating well with stable creatinine.  Upper extremity edema is almost resolved but still has lower extremity edema.  Recommended to take Lasix 20 mg daily for rest of the week then change to as needed use.  Advised to take extra potassium with Lasix to avoid hypokalemia in the setting of prolonged QTC.    Non-STEMI,Complete Heart Block/bifascicular block (RBBB and LAFB) : Patient presented with severe chest pain and noted to be in complete heart block.    Seen by cardiology and underwent cardiac cath with no acute blockages.  She is now s/p PPM and ICD on 02/13/2020. Device interrogation normal.-Cardiology followed up on patient 3/22, Steri-Strips removed.  Wound healing well with no signs of redness, discharge or edema.  Cardiology cleared for discharge and stated okay to drive.  Okay for bath/shower but advised no lotions, creams or ointments or powders on device site.  Acute systolic CHF with acute hypoxic respiratory failure: Present on admission with chest x-ray suggestive of cardiomegaly/pulmonary edema.  Echo on 3/7with LVEF of 40 to 45%, global hypokinesis, severe LVH.  Required intubation in the ED.  Patient received significant amount of IV fluids for pancreatitis/rhabdomyolysis initially and then received IV diuretics, now saturating well on room air.  DC sodium bicarb tablets to avoid salt/fluid retention.  Discharged on low-dose oral Lasix with potassium as discussed above.  To follow-up PCP, cardiology clinic in a week for further fluid/electrolyte balance assessment and titration of diuretics.  Not on ACE inhibitors due to AKI  and hypotension.  Uncontrolled insulin dependent DM-2 with DKA: Present on admission.  Patient was struggling to get insulin supplies prior to admission.  Presented with DKA that has resolved. A1c 12.7%. Hospital course complicated by hypoglycemic as well as hyperglycemic episodes.C-peptide and insulin level within normal range.Currently on Lantus 22 units twice a day,meal coverage and sliding scales with blood glucose well controlled, less than 200.  Seen by diabetes educator and follow-up with [place] and wellness advised.  Medications ordered through wellness clinic.  AKI-multifactorial including cardiorenal, rhabdoand iatrogenic from ARB/HCTZ and NSAIDs Now resolved.  Avoid nephrotoxins.  DC sodium bicarb tablets as metabolic acidosis now resolved. Monitor  with periodic labs as outpatient while on oral lasix.  Hypokalemia/hypomagnesemia/hypophosphatemia-replaced as needed throughout the hospital course. Currently on scheduled potassium 50mq BID but potassium still only at 3.5, now also started on oral lasix.  Will continue potassium 40 mEq supplements upon discharge and advised additional potassium supplementation while taking Lasix.  Hopefully potassium level will come up once magnesium level improves and since diarrhea resolved.  Hypocalcemia/vitamin D deficiency: Calcium 4.8. Multifactorial including vitamin D deficiency (<4.2), rhabdo and acute pancreatitis. PTH is appropriately high. Urine calcium appropriately low at 4.3.-s/p Calcium gluconate 3 g.  Continue P.o. vitamin D 50,000 IU weekly and 2000 daily-suspect poor observation in the setting of acute pancreatitis.  Calcium level now improving, today is at 8.0, with last albumin level at 1.5 on 3/19.  QTc prolongation-monitored on telemetry during the hospital course, did have arrhythmias/electrolyte abnormalities as explained above, keep mag close to 2 (currently at 1.8), potassium close to 4 (currently at 3.5).  Patient  received 1 g of IV magnesium yesterday and 2 g of IV magnesium today.  Will discharge on oral magnesium supplements. Avoid QT prolonging agents,  QTC now down from 619to 526 now (also EKG showing paced rhythm so possibly atrial QTC slightly lower).  Rhabdomyolysis: Due to alcohol? Statin? CK [phone]. CK-MB low.  Received IV fluids during the hospital course.  Statins held.CK normalized now.  Recommend repeat lipid profile as outpatient.  Alcohol use disorder:Reportedly drinks about 5-6 drinks including liquors.  Counseled to quit. -Consulted TOC for resources.  Multivitamin/thiamine  Tobacco use disorder: Smokes about a pack a day.-Cessation counseling.  Nicotine patch while here  Obesity: Body mass index is 37.63 kg/m.  Counseled regarding risks and diet modifications.  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  She states her children were helping her find a place when she got sick and got admitted to the hospital.  Patient met withsocial worker and outpatient referrals have been placed for medication assistance, home health and DME..  Patient already has a PCP Dr. Joya Gaskins with home she plans to follow-up in 5 to 7 days.   Discharge Exam:  Vitals:   02/26/20 0950 02/26/20 1400  BP: 137/76 133/74  Pulse:  84  Resp:  20  Temp:  98.4 F (36.9 C)  SpO2:     Vitals:   02/25/20 2008 02/26/20 0716 02/26/20 0950 02/26/20 1400  BP: 133/70 132/68 137/76 133/74  Pulse: 88 86  84  Resp: 15   20  Temp: 98.9 F (37.2 C) 98.5 F (36.9 C)  98.4 F (36.9 C)  TempSrc: Oral Oral  Oral  SpO2: 100% 98%    Weight:      Height:        General: Pt is alert, awake, not in acute distress, saturating well on room air Cardiovascular: RRR, S1/S2 +, no rubs, no gallops Respiratory: CTA bilaterally, no wheezing, no  rhonchi Abdominal: Soft, NT, ND, bowel sounds + Extremities: 1+ pitting edema in lower extremities, no cyanosis  Discharge Condition:Stable CODE STATUS: Full code Diet recommendation: Low-salt, heart healthy, diabetic diet Recommendations for Outpatient Follow-up:  1. Follow up with PCP: 5 to 7 days 2. Follow up with consultants: Cardiology in 10 to 14 days, GI in 3 weeks as scheduled 3. Please obtain follow up labs including: BMP, magnesium levels.  Lasix dose adjustment based on fluid status and potassium levels.  Home Health services upon discharge: Home PT/OT Equipment/Devices upon discharge: 3 and 1, shower chair   Discharge Instructions:  Discharge Instructions    AMB referral to CHF clinic   Complete by: As directed    Call MD for:  difficulty breathing, headache or visual disturbances   Complete by: As directed    Call MD for:  extreme fatigue   Complete by: As directed    Call MD for:  persistant dizziness or light-headedness   Complete by: As directed    Call MD for:  persistant nausea and vomiting   Complete by: As directed    Call MD for:  severe uncontrolled pain   Complete by: As directed    Call MD for:  temperature >100.4   Complete by: As directed    Diet - low sodium heart healthy   Complete by: As directed    Diet Carb Modified   Complete by: As directed  Increase activity slowly   Complete by: As directed    Increase activity slowly   Complete by: As directed    Okay for bath/shower but advised no lotions, creams or ointments or powders on device site.     Allergies as of 02/26/2020      Reactions   Strawberry Extract Hives   Sulfa Antibiotics Hives, Itching      Medication List    STOP taking these medications   Aleve 220 MG tablet Generic drug: naproxen sodium   amLODipine 10 MG tablet Commonly known as: NORVASC   atorvastatin 40 MG tablet Commonly known as: LIPITOR   Cetirizine HCl 10 MG Caps   cloNIDine 0.1 MG tablet Commonly  known as: CATAPRES   losartan-hydrochlorothiazide 100-25 MG tablet Commonly known as: HYZAAR   meloxicam 7.5 MG tablet Commonly known as: MOBIC   triamcinolone ointment 0.5 % Commonly known as: KENALOG     TAKE these medications   Adjustable Lancing Device Misc Inject 1 each into the skin 3 (three) times daily.   aspirin EC 81 MG tablet Take 1 tablet (81 mg total) by mouth daily.   Basaglar KwikPen 100 UNIT/ML Inject 0.22 mLs (22 Units total) into the skin 2 (two) times daily. What changed:   how much to take  when to take this   blood glucose meter kit and supplies Kit Dispense based on patient and insurance preference. Use up to four times daily as directed. (FOR ICD-9 250.00, 250.01).   dicyclomine 10 MG capsule Commonly known as: Bentyl Take 1 capsule (10 mg total) by mouth 3 (three) times daily before meals for 7 days.   famotidine 20 MG tablet Commonly known as: PEPCID Take 1 tablet (20 mg total) by mouth 2 (two) times daily.   ferrous sulfate 325 (65 FE) MG tablet Take 1 tablet (325 mg total) by mouth 2 (two) times daily with a meal.   folic acid 1 MG tablet Commonly known as: FOLVITE Take 1 tablet (1 mg total) by mouth daily. Start taking on: February 27, 2020   furosemide 20 MG tablet Commonly known as: LASIX Take 1 tablet by mouth daily as needed for fluid, edema, or leg swellings, take extra potassium with Lasix.   glucose blood test strip Commonly known as: True Metrix Blood Glucose Test 1 each by Other route every morning.   hydrOXYzine 25 MG tablet Commonly known as: ATARAX/VISTARIL Take 25 mg by mouth 2 (two) times daily as needed for anxiety.   Insulin Pen Needle 31G X 8 MM Misc Commonly known as: B-D ULTRAFINE III SHORT PEN 1 application by Does not apply route daily. What changed: Another medication with the same name was added. Make sure you understand how and when to take each.   Pen Needles 31G X 8 MM Misc Use as directed What  changed: You were already taking a medication with the same name, and this prescription was added. Make sure you understand how and when to take each.   Lancets 30G Misc 1 each by Does not apply route 3 (three) times daily.   magnesium oxide 400 (241.3 Mg) MG tablet Commonly known as: MAG-OX Take 1 tablet (400 mg total) by mouth daily. Start taking on: February 27, 2020   NovoLOG FlexPen 100 UNIT/ML FlexPen Generic drug: insulin aspart Inject 3 Units into the skin 3 (three) times daily with meals. As well as per Sliding scale based on Glucose checks/reading 4 times daily What changed:   how much to  take  additional instructions   nystatin 100000 UNIT/ML suspension Commonly known as: MYCOSTATIN Take 5 mLs (500,000 Units total) by mouth 4 (four) times daily.   Pancrelipase (Lip-Prot-Amyl) 24000-76000 units Cpep Take 1 capsule (24,000 Units total) by mouth 3 (three) times daily with meals.   potassium chloride SA 20 MEQ tablet Commonly known as: KLOR-CON Take 2 tablets (40 mEq total) by mouth daily.   saccharomyces boulardii 250 MG capsule Commonly known as: FLORASTOR Take 1 capsule (250 mg total) by mouth 2 (two) times daily.   simethicone 80 MG chewable tablet Commonly known as: MYLICON Chew 1 tablet (80 mg total) by mouth 4 (four) times daily as needed for flatulence.   True Metrix Meter w/Device Kit 1 each by Does not apply route as needed.   Vitamin D (Ergocalciferol) 1.25 MG (50000 UNIT) Caps capsule Commonly known as: DRISDOL Take 1 capsule (50,000 Units total) by mouth every 7 (seven) days. Start taking on: February 29, 2020   Vitamin D3 25 MCG tablet Commonly known as: Vitamin D Take 2 tablets (2,000 Units total) by mouth daily. Start taking on: February 27, 2020            Durable Medical Equipment  (From admission, onward)         Start     Ordered   02/26/20 1448  DME Shower stool  Once     02/26/20 1450   02/25/20 1259  For home use only DME 3 n 1   Once     02/25/20 1300         Follow-up Information    Constance Haw, MD Follow up on 05/20/2020.   Specialty: Cardiology Why: at 2:15PM  Contact information: [address] [place] [address] [phone]         [facility] Follow up on 02/27/2020.   Why: @ 9:30am with Geryl Rankins for hospital follow-up. This will be a virtual visit. Contact information: [place] [address] [place], [facility] Follow up.   Specialty: Home Health Services Why: Physical and Occupational Therapy-office to call with visit times. If you have questions you can call the office at [phone].        [facility] Follow up.   Why:  3n1- to be delivered to the bedside- [employer] to speak with patient about payment.  Contact information: [address] [place] [address] [phone]          Allergies  Allergen Reactions  . Strawberry Extract Hives  . Sulfa Antibiotics Hives and Itching      The results of significant diagnostics from this hospitalization (including imaging, microbiology, ancillary and laboratory) are listed below for reference.    Labs: BNP (last 3 results) No results for input(s): BNP in the last 8760 hours. Basic Metabolic Panel: Recent Labs  Lab 02/22/20 0255 02/22/20 0255 02/23/20 0309 02/24/20 0657 02/25/20 0521 02/26/20 0348 02/26/20 1211  NA 133*  --  135 136 137 137  --   K 3.4*  --  3.2* 3.4* 3.5 3.5  --   CL 103  --  103 103 105 104  --   CO2 20*  --  _0 --   GLUCOSE 246*  --  309* 258* 202* 120*  --   BUN 7  --  _1 5*  --   CREATININE 0.90  --  1.03* 0.83 0.80 0.76  --   CALCIUM 7.4*  --  7.7* 7.9* 8.0* 8.0*  --   MG 1.6*   < > 1.8 1.7 1.7 1.6* 1.8  PHOS  --   --   --  2.9  --   --   --    < > = values in this interval not displayed.   Liver Function Tests: Recent Labs  Lab 02/21/20 0331  02/22/20 0255  AST 22 23  ALT 29 24  ALKPHOS 112 122  BILITOT 0.5 0.6  PROT 6.1* 5.9*  ALBUMIN 1.5* 1.5*   Recent Labs  Lab 02/22/20 0255 02/23/20 0309 02/24/20 0657 02/25/20 0521 02/26/20 0348  LIPASE 158* 164* 147* 125* 115*   No results for input(s): AMMONIA in the last 168 hours. CBC: Recent Labs  Lab 02/22/20 0255 02/23/20 0309 02/24/20 0657 02/25/20 0521 02/26/20 0348  WBC 16.1* 15.3* 14.3* 12.8* 10.8*  NEUTROABS 13.4* 12.4* 11.6* 10.3* 8.3*  HGB 8.3* 8.6* 8.6* 8.5* 8.0*  HCT 26.3* 26.7* 27.5* 26.9* 25.6*  MCV 93.9 92.4 93.5 94.7 93.8  PLT 254 282 310 328 344   Cardiac Enzymes: Recent Labs  Lab 02/22/20 0255  CKTOTAL 51   BNP: Invalid input(s): POCBNP CBG: Recent Labs  Lab 02/25/20 1220 02/25/20 1653 02/25/20 2148 02/26/20 0820 02/26/20 1157  GLUCAP 162* 157* 155* 93 140*   D-Dimer No results for input(s): DDIMER in the last 72 hours. Hgb A1c No results for input(s): HGBA1C in the last 72 hours. Lipid Profile No results for input(s): CHOL, HDL, LDLCALC, TRIG, CHOLHDL, LDLDIRECT in the last 72 hours. Thyroid function studies No results for input(s): TSH, T4TOTAL, T3FREE, THYROIDAB in the last 72 hours.  Invalid input(s): FREET3 Anemia work up No results for input(s): VITAMINB12, FOLATE, FERRITIN, TIBC, IRON, RETICCTPCT in the last 72 hours. Urinalysis    Component Value Date/Time   COLORURINE YELLOW 02/10/2020 0617   APPEARANCEUR CLOUDY (A) 02/10/2020 0617   LABSPEC 1.015 02/10/2020 0617   PHURINE 5.0 02/10/2020 0617   GLUCOSEU >=500 (A) 02/10/2020 0617   HGBUR MODERATE (A) 02/10/2020 0617   BILIRUBINUR NEGATIVE 02/10/2020 0617   BILIRUBINUR negative 07/10/2019 1445   BILIRUBINUR neg 09/28/2017 0907   KETONESUR NEGATIVE 02/10/2020 0617   PROTEINUR 100 (A) 02/10/2020 0617   UROBILINOGEN 0.2 07/10/2019 1445   UROBILINOGEN 0.2 07/16/2015 2024   NITRITE NEGATIVE 02/10/2020 0617   LEUKOCYTESUR NEGATIVE 02/10/2020 0617   Sepsis  Labs Invalid input(s): PROCALCITONIN,  WBC,  LACTICIDVEN Microbiology Recent Results (from the past 240 hour(s))  OVA + PARASITE EXAM     Status: None   Collection Time: 02/20/20  1:16 AM   Specimen: Stool  Result Value Ref Range Status   OVA + PARASITE EXAM Final report  Final    Comment: (NOTE) These results were obtained using wet preparation(s) and trichrome stained smear. This test does not include testing for Cryptosporidium parvum, Cyclospora, or Microsporidia. Performed At: [facility] [place], [place] [id number] Elwanda Brooklyn R MD IW:[id number]    Source of Sample STOOL  Final  Stool culture (children & immunocomp patients)     Status: None   Collection Time: 02/20/20  1:16 AM   Specimen: Stool  Result Value Ref Range Status   Salmonella/Shigella Screen Final report  Final   Campylobacter Culture Final report  Final   E coli, Shiga toxin Assay Negative Negative Final    Comment: (NOTE) Performed At: [place],  [place] [id number] Rush Farmer MD NW:[id number]   GI pathogen panel by PCR, stool     Status: None   Collection Time: 02/20/20  1:16 AM   Specimen: Stool  Result Value Ref Range Status   Plesiomonas shigelloides NOT DETECTED NOT DETECTED Final   Yersinia enterocolitica NOT DETECTED NOT DETECTED Final   Vibrio NOT DETECTED NOT DETECTED Final   Enteropathogenic E coli NOT DETECTED NOT DETECTED Final   E coli (ETEC) LT/ST NOT DETECTED NOT DETECTED Final   E coli 5784 by PCR Not applicable NOT DETECTED Final   Cryptosporidium by PCR NOT DETECTED NOT DETECTED Final   Entamoeba histolytica NOT DETECTED NOT DETECTED Final   Adenovirus F 40/41 NOT DETECTED NOT DETECTED Final   Norovirus GI/GII NOT DETECTED NOT DETECTED Final   Sapovirus NOT DETECTED NOT DETECTED Final    Comment: (NOTE) Performed At: [facility] [place], [place] [id number] Rush Farmer MD XL:[id number]     Vibrio cholerae NOT DETECTED NOT DETECTED Final   Campylobacter by PCR NOT DETECTED NOT DETECTED Final   Salmonella by PCR NOT DETECTED NOT DETECTED Final   E coli (STEC) NOT DETECTED NOT DETECTED Final   Enteroaggregative E coli NOT DETECTED NOT DETECTED Final   Shigella by PCR NOT DETECTED NOT DETECTED Final   Cyclospora cayetanensis NOT DETECTED NOT DETECTED Final   Astrovirus NOT DETECTED NOT DETECTED Final   G lamblia by PCR NOT DETECTED NOT DETECTED Final   Rotavirus A by PCR NOT DETECTED NOT DETECTED Final  STOOL CULTURE REFLEX - RSASHR     Status: None   Collection Time: 02/20/20  1:16 AM  Result Value Ref Range Status   Stool Culture result 1 (RSASHR) Comment  Final    Comment: (NOTE) No Salmonella or Shigella recovered. Performed At: [facility] [address] [place], [place] [id number] Rush Farmer MD QQ:[id number]   STOOL CULTURE Reflex - CMPCXR     Status: None   Collection Time: 02/20/20  1:16 AM  Result Value Ref Range Status   Stool Culture result 1 (CMPCXR) Comment  Final    Comment: (NOTE) No Campylobacter species isolated. Performed At: [facility] [place], [place] [id number] Rush Farmer MD ZY:[id number]   C difficile quick scan w PCR reflex     Status: None   Collection Time: 02/22/20 11:39 AM   Specimen: STOOL  Result Value Ref Range Status   C Diff antigen NEGATIVE NEGATIVE Final   C Diff toxin NEGATIVE NEGATIVE Final   C Diff interpretation No C. difficile detected.  Final    Comment: Performed at [facility], [place] [address] [place], [place] [address]    Procedures/Studies: CT ABDOMEN PELVIS WO CONTRAST  Result Date: 02/10/2020 CLINICAL DATA:  49 year old female with abdominal pain. EXAM: CT ABDOMEN AND PELVIS WITHOUT CONTRAST TECHNIQUE: Multidetector CT imaging of the abdomen and pelvis was performed following the standard protocol without IV contrast. COMPARISON:  Abdominal radiograph dated 02/10/2020  and CT dated 11/11/2018. FINDINGS: Evaluation of this exam is limited in the absence of intravenous contrast. Lower chest: There are bibasilar consolidative changes with air bronchograms which may represent atelectasis versus pneumonia. Clinical correlation is recommended. Small bilateral pleural effusions noted. A right femoral vein line is noted with tip in the right ventricle. No intra-abdominal free air. Small free fluid within the pelvis as well as small upper abdominal ascites. Hepatobiliary: Advanced fatty infiltration of the liver. No intrahepatic biliary ductal dilatation. There is  sludge within the gallbladder. Pancreas: Evaluation of the pancreas is very limited due to surrounding inflammation. Findings most consistent with acute pancreatitis. Correlation with pancreatic enzymes recommended. No drainable fluid collection/abscess or pseudocyst. Spleen: Normal in size without focal abnormality. Adrenals/Urinary Tract: The adrenal glands are unremarkable. There is no hydronephrosis or obstructing stone on either side. Faint high attenuating density in the right renal upper pole collecting system likely represent excreted contrast from recent intravenous administration. The visualized ureters appear unremarkable. The urinary bladder is decompressed around a Foley catheter. Excreted contrast noted within the urinary bladder. Stomach/Bowel: An enteric tube is noted with tip in the distal stomach. There is no bowel obstruction. The appendix is normal. Vascular/Lymphatic: Mild atherosclerotic calcification of the abdominal aorta. The IVC is grossly unremarkable. No portal venous gas. No adenopathy. Reproductive: The uterus is anteverted and grossly unremarkable. Other: Midline vertical anterior abdominal wall incisional scar. Musculoskeletal: Mild diffuse subcutaneous edema. No fluid collection. No acute osseous pathology. IMPRESSION: 1. Acute pancreatitis. No drainable fluid collection/abscess. 2. Advanced fatty  infiltration of the liver. 3. Gallbladder sludge. 4. No bowel obstruction. Normal appendix. 5. Small bilateral pleural effusions and bibasilar atelectasis versus pneumonia. 6. Aortic Atherosclerosis (ICD10-I70.0). Electronically Signed   By: Anner Crete M.D.   On: 02/10/2020 21:24   DG Chest 2 View  Result Date: 02/14/2020 CLINICAL DATA:  Permanent pacemaker insertion. EXAM: CHEST - 2 VIEW COMPARISON:  Chest x-ray 02/10/2020 FINDINGS: Removal of endotracheal tube and left IJ central venous catheter. Permanent left-sided pacemaker with right atrial and right ventricular wires in good position and no complicating features. Stable cardiac enlargement. Low lung volumes with vascular crowding and streaky bibasilar atelectasis. Mild persistent vascular congestion but overall much improved aeration. IMPRESSION: 1. Improving lung aeration with decreasing vascular congestion and bibasilar atelectasis. 2. Removal of endotracheal tube and left IJ central venous catheter. 3. Pacer wires in good position without complicating features. Electronically Signed   By: Marijo Sanes M.D.   On: 02/14/2020 09:38   DG Abd 1 View  Result Date: 02/10/2020 CLINICAL DATA:  Bedside NG/OG tube placement. EXAM: Portable ABDOMEN-1 VIEW 7:10 a.m.: COMPARISON:  Portable abdomen x-ray earlier same day at 5:11 a.m. FINDINGS: Nasogastric tube tip at the EG junction. The tube should be advanced several centimeters. Mild gaseous distention of the stomach. IMPRESSION: 1. Nasogastric tube tip at the EG junction. The tube should be advanced several centimeters. 2. Mild gaseous distention of the stomach. These results will be called to the ordering clinician or representative by the Radiologist Assistant, and communication documented in the PACS or zVision Dashboard. Electronically Signed   By: Evangeline Dakin M.D.   On: 02/10/2020 07:58   CT ABDOMEN PELVIS W CONTRAST  Result Date: 02/19/2020 CLINICAL DATA:  Right upper quadrant pain,  fever, elevated WBC, Murphy sign, acute pancreatitis with worsening leukocytosis EXAM: CT ABDOMEN AND PELVIS WITH CONTRAST TECHNIQUE: Multidetector CT imaging of the abdomen and pelvis was performed using the standard protocol following bolus administration of intravenous contrast. CONTRAST:  136m OMNIPAQUE IOHEXOL 300 MG/ML  SOLN COMPARISON:  02/15/2020 FINDINGS: Lower chest: Small bilateral pleural effusions and associated atelectasis or consolidation, unchanged compared to prior examination. Hepatobiliary: No solid liver abnormality is seen. No gallstones, gallbladder wall thickening, or biliary dilatation. Pancreas: Diffuse fat stranding about the pancreas and adjacent retroperitoneum, slightly worsened compared to prior examination. There is no parenchymal hypodensity or focal fluid collection. Spleen: Normal in size without significant abnormality. Adrenals/Urinary Tract: Adrenal glands are unremarkable. Kidneys are normal, without  renal calculi, solid lesion, or hydronephrosis. Bladder is unremarkable. Stomach/Bowel: Stomach is within normal limits. Appendix appears normal. No evidence of bowel wall thickening, distention, or inflammatory changes. Vascular/Lymphatic: No significant vascular findings are present. No enlarged abdominal or pelvic lymph nodes. Reproductive: No mass or other significant abnormality. Other: Anasarca. Unchanged small volume ascites. Musculoskeletal: No acute or significant osseous findings. IMPRESSION: 1. Diffuse fat stranding about the pancreas and adjacent retroperitoneum, slightly worsened compared to prior examination, consistent with worsened acute pancreatitis. No evidence of pancreatic necrosis or focal fluid collection. 2. Unchanged small bilateral pleural effusions and associated atelectasis or consolidation. 3. Unchanged small volume ascites and anasarca. Electronically Signed   By: Eddie Candle M.D.   On: 02/19/2020 16:21   CT ABDOMEN PELVIS W CONTRAST  Result Date:  02/15/2020 CLINICAL DATA:  49 year old female with abdominal pain, nausea vomiting. Pancreatitis. EXAM: CT ABDOMEN AND PELVIS WITH CONTRAST TECHNIQUE: Multidetector CT imaging of the abdomen and pelvis was performed using the standard protocol following bolus administration of intravenous contrast. CONTRAST:  162m OMNIPAQUE IOHEXOL 300 MG/ML  SOLN COMPARISON:  CT abdomen pelvis dated 02/10/2020. FINDINGS: Lower chest: Partially visualized small bilateral pleural effusions. Bibasilar streaky densities may represent atelectasis or infiltrate. Clinical correlation is recommended. Top-normal cardiac size. Partially visualized pacemaker wires. No intra-abdominal free air. Small ascites. Hepatobiliary: Background of fatty infiltration of the liver. No intrahepatic biliary duct dilatation. The gallbladder is unremarkable. Pancreas: Diffuse inflammatory changes of the upper abdomen and surrounding the pancreas in keeping with pancreatitis. No drainable fluid collection, abscess, or pseudocyst. Spleen: Normal in size without focal abnormality. Adrenals/Urinary Tract: The adrenal glands are unremarkable. There is no hydronephrosis on either side. There is symmetric enhancement and excretion of contrast by both kidneys. The urinary bladder is unremarkable. Stomach/Bowel: There is diffuse inflammatory changes and thickening of the colon and multiple loops of small bowel which may be reactive to inflammatory changes of the pancreas or represent enterocolitis. Clinical correlation is recommended. There is no bowel obstruction. The appendix is normal. Vascular/Lymphatic: Mild aortoiliac atherosclerotic disease. The IVC is unremarkable. The SMV, splenic vein, and main portal vein are patent. No portal venous gas. There is no adenopathy. Reproductive: The uterus is anteverted. Posterior body fibroid noted. No adnexal masses. Other: There is diffuse subcutaneous edema and anasarca, new or worsened since the prior CT. Midline  vertical anterior pelvic wall incisional scar. No drainable fluid collection. Musculoskeletal: No acute or significant osseous findings. IMPRESSION: 1. Acute pancreatitis. No drainable fluid collection, abscess, or pseudocyst. 2. Inflammatory changes of the colon and small bowel may be reactive to inflammatory changes of the pancreas or represent enterocolitis. Clinical correlation is recommended. No bowel obstruction. Normal appendix. 3. Small bilateral pleural effusions and bibasilar atelectasis/infiltrates. 4. Aortic Atherosclerosis (ICD10-I70.0). Electronically Signed   By: AAnner CreteM.D.   On: 02/15/2020 21:21   CARDIAC CATHETERIZATION  Result Date: 02/10/2020 Diabetic ketoacidosis with complete heart block with ventricular escape rhythm. Successful insertion of a temporary venous pacemaker advanced to the RV apex with excellent capture.  Currently set at 80 bpm, and a 5. Normal coronary arteries. LVEDP 32 mmHg RECOMMENDATION: The patient will be admitted by the critical care medicine team.  Plan echo Doppler in a.m. to assess LV systolic and diastolic function.  Further management of the patient's DKA will be addressed by CCM.  EP PPM/ICD IMPLANT  Result Date: 02/13/2020 SURGEON:  WAllegra Lai MD   PREPROCEDURE DIAGNOSIS:  Complete AV block   POSTPROCEDURE DIAGNOSIS:  Complete AV block  PROCEDURES:  1. Pacemaker implantation.   INTRODUCTION:  CASHA ESTUPINAN is a 49 y.o. female with a history of bradycardia who presents today for pacemaker implantation.  The patient reports intermittent episodes of dizziness over the past few months.  No reversible causes have been identified.  The patient therefore presents today for pacemaker implantation.   DESCRIPTION OF PROCEDURE:  Informed written consent was obtained, and  the patient was brought to the electrophysiology lab in a fasting state.  The patient required no sedation for the procedure today.  The patients left chest was prepped and draped in the  usual sterile fashion by the EP lab staff. The skin overlying the left deltopectoral region was infiltrated with lidocaine for local analgesia.  A 4-cm incision was made over the left deltopectoral region.  A left subcutaneous pacemaker pocket was fashioned using a combination of sharp and blunt dissection. Electrocautery was required to assure hemostasis.  RA/RV Lead Placement: The left axillary vein was therefore cannulated.  Through the left axillary vein, a [employer] model Tendril MRI [id number] (serial number  [id number]) right atrial lead and a [employer] model Tendril MRI [id number] (serial number  [id number]) right ventricular lead were advanced with fluoroscopic visualization into the right atrial appendage and right ventricular apex positions respectively.  Initial atrial lead P- waves measured 1.8 mV with impedance of 484 ohms and a threshold of 1.4 V at 0.5 msec.  Right ventricular lead R-waves measured 12 mV paced with an impedance of 781 ohms and a threshold of 0.9 V at 0.5 msec.  Both leads were secured to the pectoralis fascia using #2-0 silk over the suture sleeves. Device Placement:  The leads were then connected to a [place] MRI  model [id number] (serial number  [id number] ) pacemaker.  The pocket was irrigated with copious gentamicin solution.  The pacemaker was then placed into the pocket.  The pocket was then closed in 3 layers with 2.0 Vicryl suture for the 3.0 Vicryl suture subcutaneous and subcuticular layers.  Steri-  Strips and a sterile dressing were then applied. EBL<79m.  There were no early apparent complications.   CONCLUSIONS:  1. Successful implantation of a [employer] Assurity MRI dual-chamber pacemaker for symptomatic bradycardia  2. No early apparent complications.       Will CCurt Bears MD 02/13/2020 4:58 PM   DG Chest Port 1 View  Addendum Date: 02/10/2020   ADDENDUM REPORT: 02/10/2020 05:28 ADDENDUM: Upon further review, there is tubing coiled in the cervical  region, presumably an enteric tube coiled in the pharynx. These results will be called to the ordering clinician or representative by the Radiologist Assistant, and communication documented in the PACS or zVision Dashboard. Electronically Signed   By: DVinnie LangtonM.D.   On: 02/10/2020 05:28   Result Date: 02/10/2020 CLINICAL DATA:  49year old female status post intubation. EXAM: PORTABLE CHEST 1 VIEW COMPARISON:  Chest x-ray 02/10/2020. FINDINGS: An endotracheal tube is in place with tip 3.2 cm above the carina. There is a left-sided internal jugular central venous catheter with tip terminating in the distal superior vena cava. Lung volumes are low. Ill-defined opacity in the central aspect of the right lung, likely in the right lower lobe concerning for airspace consolidation. No definite pleural effusions. No evidence of pulmonary edema. Heart size is upper limits of normal. Upper mediastinal contours are unremarkable. Transcutaneous defibrillator pad projecting over the left hemithorax. IMPRESSION: 1. Support apparatus, as above. 2. Extensive airspace consolidation in  the right lower lobe concerning for pneumonia. Electronically Signed: By: Vinnie Langton M.D. On: 02/10/2020 05:24   DG Chest Portable 1 View  Result Date: 02/10/2020 CLINICAL DATA:  Intubation EXAM: PORTABLE CHEST 1 VIEW COMPARISON:  In February 10, 2020 FINDINGS: The endotracheal tube terminates above the carina by approximately 2.4 cm. The prominent interstitial lung markings are again noted. There is cardiomegaly pneumothorax. There may be small bilateral pleural effusions. There is no acute osseous abnormality. IMPRESSION: Endotracheal tube terminates above the carina by approximately 2.4 cm. Cardiomegaly with prominent interstitial lung markings which may represent pulmonary edema or an atypical infectious process. Electronically Signed   By: Constance Holster M.D.   On: 02/10/2020 01:16   DG Chest Portable 1 View  Result Date:  02/10/2020 CLINICAL DATA:  Shortness of breath. Vomiting. EXAM: PORTABLE CHEST 1 VIEW COMPARISON:  None. FINDINGS: Low lung volumes. Mild cardiomegaly. Interstitial prominence suggest pulmonary edema. No large pleural effusion. No pneumothorax. Overlying monitoring devices project over the chest. IMPRESSION: Mild cardiomegaly. Interstitial prominence suggest pulmonary edema. Low lung volumes. Electronically Signed   By: Keith Rake M.D.   On: 02/10/2020 00:33   DG Abd Portable 1V  Result Date: 02/14/2020 CLINICAL DATA:  Abdominal pain. History of pancreatitis. EXAM: PORTABLE ABDOMEN - 1 VIEW COMPARISON:  CT scan 02/10/2020 FINDINGS: Air-filled loops of small bowel in the central abdomen could represent ileus associated with the patient's pancreatitis. Small-bowel obstruction not excluded. Recommend correlation with bowel sounds and follow-up radiographs as indicated. IMPRESSION: Air-filled loops of small bowel in the central abdomen could represent ileus associated with the patient's pancreatitis. Electronically Signed   By: Marijo Sanes M.D.   On: 02/14/2020 09:43   DG Abd Portable 1V  Result Date: 02/10/2020 CLINICAL DATA:  49 year old female status post orogastric tube placement. EXAM: PORTABLE ABDOMEN - 1 VIEW COMPARISON:  Abdominal radiograph 05/20/2016. FINDINGS: No enteric tube identified. Gaseous distension of the stomach in the visualized portions of the abdomen. Lower abdomen was incompletely imaged. Transcutaneous defibrillator pad seen projecting over the left hemithorax and left upper abdomen. Central venous catheter projecting over the distal superior vena cava. IMPRESSION: 1. No enteric tube noted. Electronically Signed   By: Vinnie Langton M.D.   On: 02/10/2020 05:26   ECHOCARDIOGRAM COMPLETE  Result Date: 02/10/2020    ECHOCARDIOGRAM REPORT   Patient Name:   DOLLIE BRESSI Date of Exam: 02/10/2020 Medical Rec #:  [id number]    Height:       64.0 in Accession #:    [id number]   Weight:        210.5 lb Date of Birth:  1971-05-17   BSA:          2.000 m Patient Age:    41 years     BP:           88/75 mmHg Patient Gender: F            HR:           80 bpm. Exam Location:  Inpatient Procedure: 2D Echo, Color Doppler and Cardiac Doppler                       STAT ECHO Reported to: Dr Kirk Ruths on 02/10/2020 8:36:00 AM. Indications:    Chest Pain 786.50 / R07.9  History:        Patient has prior history of Echocardiogram examinations, most  recent 06/13/2019. Arrythmias:RBBB; Risk Factors:Hypertension,                 Diabetes, Dyslipidemia and Current Smoker. Complete heart block.                 Elevated troponin. NSTEMI. ACS.  Sonographer:    Clayton Lefort RDCS (AE) Referring Phys: [id number] TRACI R TURNER  Sonographer Comments: Patient is morbidly obese, echo performed with patient supine and on artificial respirator and suboptimal subcostal window. Image acquisition challenging due to patient body habitus. IMPRESSIONS  1. Technically difficult; mild to moderate global reduction in LV systolic function; severe LVH (myocardium with speckeld appearance; suggest further evaluation for amyloid); mild MR.  2. Left ventricular ejection fraction, by estimation, is 40 to 45%. The left ventricle has mildly decreased function. The left ventricle demonstrates global hypokinesis. There is severe left ventricular hypertrophy. Left ventricular diastolic parameters  are indeterminate.  3. Right ventricular systolic function is normal. The right ventricular size is normal. There is normal pulmonary artery systolic pressure.  4. The mitral valve is normal in structure and function. Mild mitral valve regurgitation. No evidence of mitral stenosis.  5. The aortic valve is tricuspid. Aortic valve regurgitation is not visualized. No aortic stenosis is present.  6. The inferior vena cava is dilated in size with <50% respiratory variability, suggesting right atrial pressure of 15 mmHg. FINDINGS  Left Ventricle: Left  ventricular ejection fraction, by estimation, is 40 to 45%. The left ventricle has mildly decreased function. The left ventricle demonstrates global hypokinesis. The left ventricular internal cavity size was normal in size. There is  severe left ventricular hypertrophy. Left ventricular diastolic parameters are indeterminate. Right Ventricle: The right ventricular size is normal.Right ventricular systolic function is normal. There is normal pulmonary artery systolic pressure. The tricuspid regurgitant velocity is 1.84 m/s, and with an assumed right atrial pressure of 15 mmHg,  the estimated right ventricular systolic pressure is 98.3 mmHg. Left Atrium: Left atrial size was normal in size. Right Atrium: Right atrial size was normal in size. Pericardium: There is no evidence of pericardial effusion. Mitral Valve: The mitral valve is normal in structure and function. Normal mobility of the mitral valve leaflets. Mild mitral valve regurgitation. No evidence of mitral valve stenosis. Tricuspid Valve: The tricuspid valve is normal in structure. Tricuspid valve regurgitation is mild . No evidence of tricuspid stenosis. Aortic Valve: The aortic valve is tricuspid. Aortic valve regurgitation is not visualized. No aortic stenosis is present. Pulmonic Valve: The pulmonic valve was not well visualized. Pulmonic valve regurgitation is trivial. No evidence of pulmonic stenosis. Aorta: The aortic root is normal in size and structure. Venous: The inferior vena cava is dilated in size with less than 50% respiratory variability, suggesting right atrial pressure of 15 mmHg. IAS/Shunts: No atrial level shunt detected by color flow Doppler. Additional Comments: Technically difficult; mild to moderate global reduction in LV systolic function; severe LVH (myocardium with speckeld appearance; suggest further evaluation for amyloid); mild MR. A pacer wire is visualized.  LEFT VENTRICLE PLAX 2D LVIDd:         2.70 cm LVIDs:         2.30 cm  LV PW:         2.80 cm LV IVS:        2.40 cm LVOT diam:     1.90 cm LVOT Area:     2.84 cm  RIGHT VENTRICLE  IVC RV Basal diam:  2.60 cm    IVC diam: 2.80 cm RV S prime:     7.50 cm/s TAPSE (M-mode): 0.8 cm LEFT ATRIUM             Index       RIGHT ATRIUM           Index LA diam:        3.50 cm 1.75 cm/m  RA Area:     18.80 cm LA Vol (A2C):   73.5 ml 36.76 ml/m RA Volume:   58.10 ml  29.06 ml/m LA Vol (A4C):   50.5 ml 25.26 ml/m LA Biplane Vol: 61.4 ml 30.71 ml/m   AORTA Ao Root diam: 2.30 cm Ao Asc diam:  2.70 cm TRICUSPID VALVE TR Peak grad:   13.5 mmHg TR Vmax:        184.00 cm/s  SHUNTS Systemic Diam: 1.90 cm Kirk Ruths MD Electronically signed by Kirk Ruths MD Signature Date/Time: 02/10/2020/8:47:17 AM    Final    US Abdomen Limited RUQ  Result Date: 02/14/2020 CLINICAL DATA:  Acute pancreatitis EXAM: ULTRASOUND ABDOMEN LIMITED RIGHT UPPER QUADRANT COMPARISON:  None. FINDINGS: Gallbladder: No gallstones or wall thickening visualized. No sonographic Murphy sign noted by sonographer. Common bile duct: Diameter: 4 mm, within normal limits. Liver: No focal lesion identified. Increased parenchymal echogenicity. Portal vein is patent on color Doppler imaging with normal direction of blood flow towards the liver. Other: Small volume perihepatic and pericholecystic fluid. IMPRESSION: Increased liver echogenicity likely reflecting steatosis. No dilatation of the common bile duct. No evidence of acute cholecystitis. Electronically Signed   By: Macy Mis M.D.   On: 02/14/2020 20:51    Time coordinating discharge: Over 30 minutes  SIGNED:   Guilford Shi, MD  [facility] 02/26/2020, 2:50 PM

## 2020-02-26 NOTE — Progress Notes (Signed)
Physical Therapy Treatment Patient Details Name: ASANA NAKAGAWA MRN: [id number] DOB: [birth year] Today's Date: 02/26/2020    History of Present Illness 49 y/o female w/ hx of obesity, HTN, HSV-2 infection, Herpes, dizzyness, DM, depression, bipolar 1 disorder, temp pacemaker (02/10/20), pacemake implant (02/13/20). Pt had presented to hospital with c/o 10/10 chest pain and elevated troponin, pt was dx with complete AV block, s/p new pacemaker (3/10)    PT Comments    Pt doing very well with mobility, is at mod I with most mobility with exception of ambulation. Was able to ambulate approx 346ft with SBA and no AD. Pt reports some windedness after this but did ntot outwardly display frank sx.     Follow Up Recommendations  Home health PT     Equipment Recommendations  None recommended by PT    Recommendations for Other Services       Precautions / Restrictions Precautions Precautions: Fall Restrictions Weight Bearing Restrictions: No LUE Weight Bearing: Non weight bearing RLE Weight Bearing: Weight bearing as tolerated    Mobility  Bed Mobility               General bed mobility comments: pt found sitting edge of bed  Transfers Overall transfer level: Modified independent               General transfer comment: completes all transfers from bed/chair/recliner with mod I  Ambulation/Gait Ambulation/Gait assistance: Supervision Gait Distance (Feet): 300 Feet Assistive device: None Gait Pattern/deviations: Wide base of support;Decreased stride length Gait velocity: dec   General Gait Details: ambulated distance to wonr tolarence, was instructed to ambulate as she was able   Stairs             Wheelchair Mobility    Modified Rankin (Stroke Patients Only)       Balance Overall balance assessment: Mild deficits observed, not formally tested             Standing balance comment: still some waddling noted with ambulation but no frank deficits                              Cognition Arousal/Alertness: Awake/alert Behavior During Therapy: WFL for tasks assessed/performed Overall Cognitive Status: Within Functional Limits for tasks assessed                                        Exercises      General Comments General comments (skin integrity, edema, etc.): on RA VSS      Pertinent Vitals/Pain Pain Assessment: No/denies pain    Home Living                      Prior Function            PT Goals (current goals can now be found in the care plan section) Acute Rehab PT Goals Patient Stated Goal: feeling better and ready to go home today PT Goal Formulation: With patient Time For Goal Achievement: 02/28/20 Potential to Achieve Goals: Good    Frequency    Min 3X/week      PT Plan Current plan remains appropriate    Co-evaluation              AM-PAC PT "6 Clicks" Mobility   Outcome Measure  Help needed turning from your back to  your side while in a flat bed without using bedrails?: None Help needed moving from lying on your back to sitting on the side of a flat bed without using bedrails?: None Help needed moving to and from a bed to a chair (including a wheelchair)?: None Help needed standing up from a chair using your arms (e.g., wheelchair or bedside chair)?: A Little Help needed to walk in hospital room?: A Little Help needed climbing 3-5 steps with a railing? : A Little 6 Click Score: 21    End of Session   Activity Tolerance: Patient limited by fatigue;Patient tolerated treatment well Patient left: in chair;with call bell/phone within reach Nurse Communication: Mobility status PT Visit Diagnosis: Difficulty in walking, not elsewhere classified (R26.2);Pain;Muscle weakness (generalized) (M62.81)     Time: 1400-1416 PT Time Calculation (min) (ACUTE ONLY): 16 min  Charges:  $Gait Training: 8-22 mins                     Horald Chestnut, PT    Delford Field 02/26/2020, 3:17 PM

## 2020-02-26 NOTE — TOC Transition Note (Addendum)
Transition of Care [facility]) - CM/SW Discharge Note   Patient Details  Name: Sarah Long MRN: [id number] Date of Birth: 1971-03-12  Transition of Care [facility]) CM/SW Contact:  Bethena Roys, RN Phone Number: 02/26/2020, 1:06 PM   Clinical Narrative:   Case Manager confirmed the address at the [facility]. Case Manager received verbal permission from the patient to provide above information to the [place]. Patient will receive a shower chair today. Has a 3n1 in the room. Case Manager will assist with medications via the [place]. Case Manager asked the physician to send medications to the [facility] so they can be delivered to the bedside. Home health orders and F2F to be placed in Epic along with shower chair order. Case Manager did speak with [facility] about shower chair delivery. No further needs from Case Manager at this time.    [id number] 02-26-20 Patient declined outpatient substance abuse resources. Patient stated she had a previous history of alcohol abuse, but does not now and will not need the resources.    Final next level of care: [place] Barriers to Discharge: Barriers Resolved   Patient Goals and CMS Choice Patient states their goals for this hospitalization and ongoing recovery are:: "to return home" CMS Medicare.gov Compare Post Acute Care list provided to:: [facility] week -agency) Choice offered to / list presented to : NA    Discharge Plan and Services In-house Referral: NA Discharge Planning Services: CM Consult Post Acute Care Choice: Home Health          DME Arranged: 3-N-1, Shower stool DME Agency: [facility] Date DME Agency Contacted: 02/19/20 Time DME Agency Contacted: 1200 Representative spoke with at DME Agency: [place]: PT, OT [place] Agency: [place] ([place]) Date [place]: 02/19/20 Time [place]: 0900    Readmission Risk Interventions No flowsheet data  found.

## 2020-02-27 ENCOUNTER — Ambulatory Visit: Payer: Medicaid Other | Admitting: Nurse Practitioner

## 2020-02-28 ENCOUNTER — Ambulatory Visit: Payer: Medicaid Other

## 2020-02-28 ENCOUNTER — Telehealth: Payer: Self-pay | Admitting: Critical Care Medicine

## 2020-02-28 NOTE — Telephone Encounter (Signed)
Brandon from [facility] called asking for Verbal Orders for OT for 1 time a week for 4 weeks. Please call him back at [phone] it is ok to lvm

## 2020-03-04 ENCOUNTER — Telehealth: Payer: Self-pay | Admitting: Critical Care Medicine

## 2020-03-04 NOTE — Telephone Encounter (Signed)
It appears she has an OV with me on 03/11/20.   Also I see an OV tomorrow with Ms Raul Del  This could be discussed tomorrow at the [place] visit.    If there is not a visit with Raul Del tomorrow, I could do a telemedicine visit with her tomorrow AM while I am on the mobile medicine unit

## 2020-03-04 NOTE — Telephone Encounter (Signed)
Physical Therapy with [facility] called and requested to inform pcp that the patient was a ''mess'' today. She stated that the patient was very emotional today and stated numerous times how much she missed her kids and family, and doing so was making her more upset. Patient reported that she was dizzy while standing today. Patients blood pressure was 150/92 during PT visit. PT stated that ED discontinued all bp medications. Patients blood sugar was 230 in the am. PT reported that a social workers help with transportation would be helpful for the patient. Please follow up at your earliest convenience.

## 2020-03-05 ENCOUNTER — Ambulatory Visit: Payer: MEDICAID | Admitting: Nurse Practitioner

## 2020-03-05 ENCOUNTER — Other Ambulatory Visit: Payer: Self-pay

## 2020-03-05 ENCOUNTER — Telehealth: Payer: Self-pay | Admitting: Critical Care Medicine

## 2020-03-05 NOTE — Telephone Encounter (Signed)
Patient Home visit information. Patient needed to discuss this during visit today.

## 2020-03-05 NOTE — Telephone Encounter (Signed)
Spoke to [place] and verbally approve the OT.

## 2020-03-05 NOTE — Telephone Encounter (Signed)
Merry Proud from [place] called requesting verbal orders for PT 1 week 5. Please f/u

## 2020-03-06 NOTE — Telephone Encounter (Signed)
Patient has not seen [place], she has been seeing [facility]right.  Can you give the verbal orders please.

## 2020-03-10 NOTE — Telephone Encounter (Signed)
Medical Assistant left message on patient's home and cell voicemail. MA gave VO for PT for patient.

## 2020-03-11 ENCOUNTER — Other Ambulatory Visit: Payer: Self-pay

## 2020-03-11 ENCOUNTER — Telehealth: Payer: Self-pay

## 2020-03-11 ENCOUNTER — Encounter: Payer: Self-pay | Admitting: Critical Care Medicine

## 2020-03-11 ENCOUNTER — Ambulatory Visit: Payer: Medicaid Other | Attending: Critical Care Medicine | Admitting: Critical Care Medicine

## 2020-03-11 VITALS — BP 137/82 | HR 90 | Temp 98.7°F | Ht 64.0 in | Wt 189.6 lb

## 2020-03-11 DIAGNOSIS — I110 Hypertensive heart disease with heart failure: Secondary | ICD-10-CM | POA: Diagnosis not present

## 2020-03-11 DIAGNOSIS — E559 Vitamin D deficiency, unspecified: Secondary | ICD-10-CM | POA: Diagnosis not present

## 2020-03-11 DIAGNOSIS — E1110 Type 2 diabetes mellitus with ketoacidosis without coma: Secondary | ICD-10-CM

## 2020-03-11 DIAGNOSIS — I10 Essential (primary) hypertension: Secondary | ICD-10-CM

## 2020-03-11 DIAGNOSIS — I428 Other cardiomyopathies: Secondary | ICD-10-CM | POA: Insufficient documentation

## 2020-03-11 DIAGNOSIS — Z87891 Personal history of nicotine dependence: Secondary | ICD-10-CM | POA: Diagnosis not present

## 2020-03-11 DIAGNOSIS — Z794 Long term (current) use of insulin: Secondary | ICD-10-CM | POA: Insufficient documentation

## 2020-03-11 DIAGNOSIS — Z79899 Other long term (current) drug therapy: Secondary | ICD-10-CM | POA: Insufficient documentation

## 2020-03-11 DIAGNOSIS — Z590 Homelessness: Secondary | ICD-10-CM | POA: Insufficient documentation

## 2020-03-11 DIAGNOSIS — Z789 Other specified health status: Secondary | ICD-10-CM | POA: Insufficient documentation

## 2020-03-11 DIAGNOSIS — M6282 Rhabdomyolysis: Secondary | ICD-10-CM | POA: Diagnosis not present

## 2020-03-11 DIAGNOSIS — I5043 Acute on chronic combined systolic (congestive) and diastolic (congestive) heart failure: Secondary | ICD-10-CM | POA: Insufficient documentation

## 2020-03-11 DIAGNOSIS — N179 Acute kidney failure, unspecified: Secondary | ICD-10-CM | POA: Insufficient documentation

## 2020-03-11 DIAGNOSIS — F319 Bipolar disorder, unspecified: Secondary | ICD-10-CM | POA: Insufficient documentation

## 2020-03-11 DIAGNOSIS — Z6837 Body mass index (BMI) 37.0-37.9, adult: Secondary | ICD-10-CM | POA: Insufficient documentation

## 2020-03-11 DIAGNOSIS — Z7982 Long term (current) use of aspirin: Secondary | ICD-10-CM | POA: Insufficient documentation

## 2020-03-11 DIAGNOSIS — E876 Hypokalemia: Secondary | ICD-10-CM | POA: Insufficient documentation

## 2020-03-11 DIAGNOSIS — I442 Atrioventricular block, complete: Secondary | ICD-10-CM | POA: Diagnosis not present

## 2020-03-11 DIAGNOSIS — E785 Hyperlipidemia, unspecified: Secondary | ICD-10-CM | POA: Diagnosis not present

## 2020-03-11 DIAGNOSIS — K852 Alcohol induced acute pancreatitis without necrosis or infection: Secondary | ICD-10-CM | POA: Diagnosis not present

## 2020-03-11 DIAGNOSIS — R9431 Abnormal electrocardiogram [ECG] [EKG]: Secondary | ICD-10-CM | POA: Diagnosis not present

## 2020-03-11 DIAGNOSIS — I252 Old myocardial infarction: Secondary | ICD-10-CM | POA: Diagnosis not present

## 2020-03-11 DIAGNOSIS — F17200 Nicotine dependence, unspecified, uncomplicated: Secondary | ICD-10-CM

## 2020-03-11 DIAGNOSIS — Z8249 Family history of ischemic heart disease and other diseases of the circulatory system: Secondary | ICD-10-CM | POA: Diagnosis not present

## 2020-03-11 DIAGNOSIS — E1165 Type 2 diabetes mellitus with hyperglycemia: Secondary | ICD-10-CM

## 2020-03-11 DIAGNOSIS — J9601 Acute respiratory failure with hypoxia: Secondary | ICD-10-CM | POA: Insufficient documentation

## 2020-03-11 DIAGNOSIS — Z950 Presence of cardiac pacemaker: Secondary | ICD-10-CM | POA: Diagnosis not present

## 2020-03-11 DIAGNOSIS — E7800 Pure hypercholesterolemia, unspecified: Secondary | ICD-10-CM | POA: Insufficient documentation

## 2020-03-11 DIAGNOSIS — I452 Bifascicular block: Secondary | ICD-10-CM

## 2020-03-11 DIAGNOSIS — Z7289 Other problems related to lifestyle: Secondary | ICD-10-CM

## 2020-03-11 LAB — GLUCOSE, POCT (MANUAL RESULT ENTRY): POC Glucose: 342 mg/dl — AB (ref 70–99)

## 2020-03-11 MED ORDER — FERROUS SULFATE 325 (65 FE) MG PO TABS: 325.0000 mg | ORAL_TABLET | Freq: Two times a day (BID) | ORAL | 3 refills | Status: DC

## 2020-03-11 MED ORDER — NOVOLOG FLEXPEN 100 UNIT/ML ~~LOC~~ SOPN: PEN_INJECTOR | SUBCUTANEOUS | 11 refills | Status: DC

## 2020-03-11 MED ORDER — FUROSEMIDE 20 MG PO TABS: ORAL_TABLET | ORAL | 1 refills | Status: DC

## 2020-03-11 MED ORDER — DICYCLOMINE HCL 10 MG PO CAPS: 10.0000 mg | ORAL_CAPSULE | Freq: Three times a day (TID) | ORAL | 0 refills | Status: DC

## 2020-03-11 MED ORDER — DIGITAL GLASS SCALE MISC: 0 refills | Status: DC

## 2020-03-11 MED ORDER — BASAGLAR KWIKPEN 100 UNIT/ML ~~LOC~~ SOPN: 44.0000 [IU] | PEN_INJECTOR | Freq: Every day | SUBCUTANEOUS | 2 refills | Status: DC

## 2020-03-11 MED ORDER — VITAMIN D (ERGOCALCIFEROL) 1.25 MG (50000 UNIT) PO CAPS: 50000.0000 [IU] | ORAL_CAPSULE | ORAL | 1 refills | Status: DC

## 2020-03-11 MED FILL — NOVOLOG FLEXPEN SYRINGE: 100 | 41 days supply | Qty: 15 | Fill #0

## 2020-03-11 MED FILL — DICYCLOMINE 10 MG CAPSULE: 10 | 7 days supply | Qty: 21 | Fill #0

## 2020-03-11 MED FILL — VIT D2 1.25 MG (50,000 UNIT: 1.25 MG | 35 days supply | Qty: 5 | Fill #0

## 2020-03-11 MED FILL — LANTUS SOLOSTAR 100 UNITS/M: 100 | 34 days supply | Qty: 15 | Fill #0

## 2020-03-11 NOTE — Assessment & Plan Note (Signed)
EKG today obtained shows paced rhythm and prolonged QTC persisting   follow-up with cardiology

## 2020-03-11 NOTE — Assessment & Plan Note (Signed)
We will follow-up metabolic profile and based on patient's exam I suspect DKA is now resolved

## 2020-03-11 NOTE — Telephone Encounter (Signed)
I can and it says basaglar on my end

## 2020-03-11 NOTE — Assessment & Plan Note (Signed)
Continue vitamin D supplementation follow-up vitamin D levels

## 2020-03-11 NOTE — Assessment & Plan Note (Signed)
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

## 2020-03-11 NOTE — Assessment & Plan Note (Signed)
Hypertension with now well-controlled on current

## 2020-03-11 NOTE — Progress Notes (Signed)
Subjective:    Patient ID: Sarah Long, female    DOB: 1971/10/14, 49 y.o.   MRN: [id number]  This is a 49 year old female history of type 2 diabetes and bipolar disorder.  Patient was last seen in June and was found to have hypertension not well controlled, a holosystolic murmur which was found to be physiologic with normal echocardiogram, type 2 diabetes poorly controlled, bipolar disorder, homelessness, and ongoing tobacco use.  The patient now is moved back to the homeless shelter away from the hotel she was housed in over the pandemic.  Note today on arrival blood sugar was greater than 500 she states she has not taken her insulin recently.  The patient has been compliant with her blood pressure medications however is still smoking a pack a day of cigarettes.  Patient has been compliant with her [name] that she receives from family services of the [place].  The patient does have bunions in the feet but is yet to have her appointment with podiatry  10/02/2019 Patient is seen in follow-up from an August visit for poorly controlled type 2 diabetes, tobacco use, hyperlipidemia.  The patient lives in a shelter at [employer] and does work Building surveyor.  The patient often forgets to take her blood pressure medicine but she is taking her insulin daily.  Unfortunate her diet has been very noncompliant.  She eats lots of fast food and drinks sodas that contain sugar and sugar Gatorade.  She apparently had a very carbohydrate filled lunch along with sugar Sprite and sugar Gatorade and comes in with a CBG of 501 at this visit.  The patient is maintaining Lantus 80 units daily note she is already had her flu vaccine  She has improvement in her anxiety and depression.  She does complain of significant menstrual cramps that come on with her periods  The patient has tried ibuprofen 6 mg dose without relief.  03/11/2020 Has been in hosp since last ov The pt was admitted severely ill with  pancreatitis, AKI, multiple electrolyte disorders, DKA, complete heart block and evidence of heart failure without evidence of coronary artery disease on cardiac catheterization.  Below is the discharge summary  Dc summary:  Admit date: 02/09/2020 Discharge date: 02/26/2020 Consultations: Cardiology, [facility] Admitted From: home Disposition: home  Discharge Diagnoses:  Principal Problem:   Complete heart block ([place]) Active Problems:   Acute pancreatitis without infection or necrosis   Diabetic ketoacidosis without coma associated with type 2 diabetes mellitus (HCC)   Elevated troponin   NSTEMI (non-ST elevated myocardial infarction) (HCC)   Lactic acid acidosis   Endotracheally intubated   ACS (acute coronary syndrome) (HCC)   Prolonged QT interval   Acute on chronic combined systolic and diastolic CHF (congestive heart failure) [facility])   Hospital Course Summary: 49 yr old obese F w/ significant PMHx alcohol abuse, tobacco abuse, IDDM, HTN, HLD and morbid obesity presented to [facility] with 10/10 chest pain. Per the triage note pt reported vomiting all day, being short of breath and having severe chest pain feelingnear syncopal, although shedenied loss of consciousness oractualsyncopal episode.  ED Course: Afebrile, BP133/73mHg HR 41 RR 26 O2 sat 94% . CBG>6034mdl.BiBrandt.SalmAG 30. Na 131. K 5.3. Cr 2.85. Lipase 1879 AST 582. ALT 294. EKG showed an Idioventricular rhythm with LAD, AVB, LVH and an inferior infarct (QRS 162 ms and QT/QTc 574/467 ms). Troponin 730>>2000.Chest x-ray showed mild interstitial prominence suggesting pulmonary edema, low lung volumes. Patient went into complete AVB with  HR of 39 while in EDrequiringexternal pacer. Done she becomes somnolent after IV Versed requiring intubation.Hospital course:Cardiology consulted and she was admitted to ICU. She was also started on Unasyn for possible aspiration pneumonia.Patient underwent LHC on 3/7 which was  negative for CAD. She had temporary venous pacemaker advanced at the same time. Echo with EF of 40 to 45%, global hypokinesis, severe LVH and indeterminate diastolic function but no other significant finding. Patient was extubated on 02/11/2020.She eventually had permanent PPM/ICD implanton 02/13/2020.Patient also received evaluation/treatment foracute pancreatitis,transaminitis,rhabdomyolysis, significant hypocalcemia/vitamin D deficiency and iron deficiency anemiawhile in ICU.Patient transferred to [facility] service on 3/11. Cardiac issues been stable. Patient however has continued to complain of postprandial abdominal bloating, frequent diarrhea associated with worsening leukocytosis on lab work. Repeat CT on 3/16 showed persistent/possible worsening of pancreatitis and started on Rocephin/Flagyl. GI consulted and leukocytosis has been downtrending on antibiotics. She has also had generalized edemaduring the hospital course with echo findings as above.  Acute pancreatitis with abdominal pain and watery diarrhea-Leukocytosis got worse on 3/16 with persistent diarrhea , stool study negative for cdiff, stool PCR panel negative , stool culturesnegative for E. Coli/Campylobacter/Shigella/Salmonellaetc.Repeat CT scan done on 3/16 showed worsening of pancreatitis-started on Cipro and Flagyl, seen by [facility], Dr. Paulita Fujita and startedonpancrelipase supplement. She completed 5 days antibiotic while here.WBC count andlipase trending down,diet advanced to soft and regular diet which she has been tolerating well >48 hrs.Cleared by GI to discharge on current dose of probiotic/pancreatic enzymes. Okay to use as needed loperamide but patient no longer has diarrhea and dicontinued this medication in concern for prolonged qtc. She was receiving IV fentanyl for abdominal pain during the hospitals course. Changed to oral meds today. Reports cramping pain when having BM or on eating, will discharge on Bentyl prn.  Follow-up [facility], Dr Paulita Fujita, in few weeks.  Anasarca:Patient noted to be clinically edematouswithfluid overloadduring the hospital course. She hadnet positive 18.7 L since admission perI's and O'sdocumentation.S/p 1 dose of Lasix on 3/17, x1 on 3/18, x1 on 3/19.Improving ,encourage ambulation.Nutrition supplement.Discontinued  sodium bicarb tablets that she has been recieveing to avoid salt/fluid retention.Started onoral lasix yesterday which she is tolerating well with stable creatinine.  Upper extremity edema is almost resolved but still has lower extremity edema.  Recommended to take Lasix 20 mg daily for rest of the week then change to as needed use.  Advised to take extra potassium with Lasix to avoid hypokalemia in the setting of prolonged QTC.    Non-STEMI,Complete Heart Block/bifascicular block (RBBB and LAFB) :Patientpresented with severe chest painand noted to be in complete heart block.   Seen by cardiology and underwent cardiac cath with no acute blockages. She is now s/p PPM and ICD on 02/13/2020. Device interrogation normal.-Cardiologyfollowed up on patient 3/22, Steri-Strips removed. Wound healing well with no signs of redness, discharge or edema. Cardiology cleared for discharge and stated okay to drive. Okay for bath/shower but advised no lotions, creams or ointments or powders on device site.  Acutesystolic CHFwith acute hypoxic respiratory failure:Present on admission with chest x-ray suggestive of cardiomegaly/pulmonary edema. Echo on 3/7with LVEF of 40 to 45%, global hypokinesis, severe LVH.Required intubation in the ED. Patient received significant amount of IV fluids for pancreatitis/rhabdomyolysis initially and then received IV diuretics, now saturating well on room air. DC sodium bicarb tablets to avoid salt/fluid retention.  Discharged on low-dose oral Lasix with potassium as discussed above.  To follow-up PCP, cardiology clinic in a  week for further fluid/electrolyte balance assessment and  titration of diuretics.  Not on ACE inhibitors due to AKI and hypotension.  Uncontrolled insulin dependent DM-2 withDKA: Present on admission. Patient was struggling to get insulin supplies prior to admission. Presented with DKA that has resolved. A1c 12.7%. Hospital course complicated byhypoglycemicas well ashyperglycemicepisodes.C-peptide and insulin level within normal range.Currently on Lantus 22 units twice a day,meal coverage and sliding scaleswith blood glucose well controlled, less than 200. Seen by diabetes educator and follow-up with [place] and wellness advised.  Medications ordered through wellness clinic.  AKI-multifactorial including cardiorenal, rhabdoand iatrogenic from ARB/HCTZ and NSAIDs Now resolved. Avoid nephrotoxins. DC sodium bicarb tablets as metabolic acidosis now resolved. Monitor with periodic labs as outpatient while on oral lasix.  Hypokalemia/hypomagnesemia/hypophosphatemia-replaced as needed throughout the hospital course. Currently on scheduled potassium 31mq BID but potassium still only at 3.5, now also started on oral lasix.  Will continue potassium 40 mEq supplements upon discharge and advised additional potassium supplementation while taking Lasix.  Hopefully potassium level will come up once magnesium level improves and since diarrhea resolved.  Hypocalcemia/vitamin D deficiency: Calcium 4.8. Multifactorial including vitamin D deficiency (<4.2), rhabdo and acute pancreatitis. PTH is appropriately high. Urine calcium appropriately low at 4.3.-s/p Calcium gluconate 3 g. Continue P.o. vitamin D 50,000 IU weekly and 2000 daily-suspect poor observation in the setting of acute pancreatitis.Calciumlevel nowimproving, today isat 8.0,with lastalbuminlevel at1.5on 3/19.  QTc prolongation-monitoredon telemetryduring the hospital course, did have arrhythmias/electrolyte  abnormalities as explained above, keep mag close to 2(currently at 1.8),potassiumclose to4(currently at 3.5).  Patient received 1 g of IV magnesium yesterday and 2 g of IV magnesium today.  Will discharge on oral magnesium supplements.Avoid QT prolonging agents,QTCnowdown from 619to 526 now (also EKG showing paced rhythm so possibly atrial QTC slightly lower).  Rhabdomyolysis: Due to alcohol? Statin? CK [phone] CK-MB low.Received IV fluids during the hospital course. Statins held.CK normalizednow.  Recommend repeat lipid profile as outpatient.  Alcohol use disorder:Reportedly drinks about 5-6 drinks including liquors.Counseledto quit. -ConsultedTOC for resources.  Multivitamin/thiamine  Tobacco use disorder: Smokes about a pack a day.-Cessation counseling.Nicotine patchwhile here  Obesity:Body mass index is 37.63 kg/m.Counseled regarding risks and diet modifications.  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  She states her children were helping her find a place when she got sick and got admitted to the hospital.  Patient met withsocial worker and outpatient referrals have been placed for medication assistance, home health and DME..  Patient already has a PCP Dr. WJoya Gaskinswith home she plans to follow-up in 5 to 7 days.  Since discharge the patient states she is somewhat improved.  She has less swelling and edema.  She feels she may actually be too dry.  She has been taking insulin as prescribed.  She is no longer drinking alcohol.  She remains in a motel situation.  She has difficulty with sleeping.  She is quit smoking tobacco products as well.  Note blood sugar at home was 203 and on arrival here it was 342  Patient currently denies chest pain does have dyspnea with exertion.  She needs follow-up  appointment scheduled for cardiology and gastroenterology.  She also is requesting a scale  Wt Readings from Last 3 Encounters: 03/11/20 : 189 lb 9.6 oz (86 kg) 02/25/20 : 221 lb 9.6 oz (100.5 kg) 10/02/19 : 209 lb 12.8 oz (95.2 kg)   Past Medical History:  Diagnosis  Date  . Abnormal Pap smear 1998  . Abnormal vaginal bleeding 12/19/2011  . Bacterial infection   . Bipolar 1 disorder ([place])   . Blister of second toe of left foot 11/21/2016  . Candida vaginitis 07/2007  . Depression    recently added wellbutrin-has not taken yet for bipolar  . Diabetes in pregnancy   . Diabetes mellitus    nph 20U qam and qpm, regular with meals  . Diabetic ketoacidosis without coma associated with type 2 diabetes mellitus ([place])   . Fibroid   . Galactorrhea of right breast 2008  . H/O amenorrhea 07/2007  . H/O dizziness 10/14/11  . H/O dysmenorrhea 2010  . H/O menorrhagia 10/14/11  . H/O varicella   . Headache(784.0)   . Heavy vaginal bleeding due to contraceptive injection use 10/12/2011   Depo Provera  . Herpes   . HSV-2 infection 01/03/2009  . Hx: UTI (urinary tract infection) 2009  . Hypertension    on aldomet  . Increased BMI 2010  . Irregular uterine bleeding 04/04/2012   Pt has mirena   . Obesity 10/14/11  . Oligomenorrhea 07/2007  . Pelvic pain in female   . Preterm labor   . Trichomonas   . Yeast infection      Family History  Problem Relation Age of Onset  . Hypertension Mother   . Diabetes Mother   . Heart disease Mother   . Hypertension Father   . Diabetes Father   . Heart disease Father   . Stroke Maternal Grandfather   . Other Neg Hx      Social History   Socioeconomic History  . Marital status: Married    Spouse name: Not on file  . Number of children: Not on file  . Years of education: Not on file  . Highest education level: Not on file  Occupational History  . Not on file  Tobacco Use  . Smoking status: Former Smoker    Packs/day: 0.50    Years: 20.00     Pack years: 10.00    Types: Cigarettes    Quit date: 02/10/2020    Years since quitting: 0.0  . Smokeless tobacco: Never Used  Substance and Sexual Activity  . Alcohol use: No  . Drug use: No  . Sexual activity: Yes    Birth control/protection: None  Other Topics Concern  . Not on file  Social History Narrative  . Not on file   Social Determinants of Health   Financial Resource Strain:   . Difficulty of Paying Living Expenses:   Food Insecurity:   . Worried About Charity fundraiser in the Last Year:   . Arboriculturist in the Last Year:   Transportation Needs:   . Film/video editor (Medical):   [name] Lack of Transportation (Non-Medical):   Physical Activity:   . Days of Exercise per Week:   . Minutes of Exercise per Session:   Stress:   . Feeling of Stress :   Social Connections:   . Frequency of Communication with Friends and Family:   . Frequency of Social Gatherings with Friends and Family:   . Attends Religious Services:   . Active Member of Clubs or Organizations:   . Attends Archivist Meetings:   [name] Marital Status:   Intimate Partner Violence:   . Fear of Current or Ex-Partner:   . Emotionally Abused:   [name] Physically Abused:   . Sexually Abused:  Allergies  Allergen Reactions  . Strawberry Extract Hives  . Sulfa Antibiotics Hives and Itching     Outpatient Medications Prior to Visit  Medication Sig Dispense Refill  . blood glucose meter kit and supplies KIT Dispense based on patient and insurance preference. Use up to four times daily as directed. (FOR ICD-9 250.00, 250.01). 1 each 0  . Blood Glucose Monitoring Suppl (TRUE METRIX METER) w/Device KIT 1 each by Does not apply route as needed. 1 kit 0  . folic acid (FOLVITE) 1 MG tablet Take 1 tablet (1 mg total) by mouth daily. 30 tablet 1  . glucose blood (TRUE METRIX BLOOD GLUCOSE TEST) test strip 1 each by Other route every morning. 90 each 3  . Insulin Pen Needle (B-D ULTRAFINE III SHORT  PEN) 31G X 8 MM MISC 1 application by Does not apply route daily. 90 each 3  . Insulin Pen Needle (PEN NEEDLES) 31G X 8 MM MISC Use as directed 100 each 1  . Lancet Devices (ADJUSTABLE LANCING DEVICE) MISC Inject 1 each into the skin 3 (three) times daily. 100 each 5  . Lancets 30G MISC 1 each by Does not apply route 3 (three) times daily. 100 each 3  . lipase/protease/amylase 24000-76000 units CPEP Take 1 capsule (24,000 Units total) by mouth 3 (three) times daily with meals. 270 capsule 1  . magnesium oxide (MAG-OX) 400 (241.3 Mg) MG tablet Take 1 tablet (400 mg total) by mouth daily. 30 tablet 0  . potassium chloride SA (KLOR-CON) 20 MEQ tablet Take 2 tablets (40 mEq total) by mouth daily. 60 tablet 1  . saccharomyces boulardii (FLORASTOR) 250 MG capsule Take 1 capsule (250 mg total) by mouth 2 (two) times daily. 30 capsule 1  . dicyclomine (BENTYL) 10 MG capsule Take 1 capsule (10 mg total) by mouth 3 (three) times daily before meals for 7 days. 21 capsule 0  . ferrous sulfate 325 (65 FE) MG tablet Take 1 tablet (325 mg total) by mouth 2 (two) times daily with a meal. 30 tablet 3  . furosemide (LASIX) 20 MG tablet Take 1 tablet by mouth daily as needed for fluid, edema, or leg swellings, take extra potassium with Lasix. 30 tablet 1  . insulin aspart (NOVOLOG FLEXPEN) 100 UNIT/ML FlexPen Inject 3 Units into the skin 3 (three) times daily with meals. As well as per Sliding scale based on Glucose checks/reading 4 times daily 15 mL 11  . Insulin Glargine (BASAGLAR KWIKPEN) 100 UNIT/ML Inject 0.22 mLs (22 Units total) into the skin 2 (two) times daily. 15 mL 2  . Vitamin D, Ergocalciferol, (DRISDOL) 1.25 MG (50000 UNIT) CAPS capsule Take 1 capsule (50,000 Units total) by mouth every 7 (seven) days. 5 capsule 1  . famotidine (PEPCID) 20 MG tablet Take 1 tablet (20 mg total) by mouth 2 (two) times daily. (Patient not taking: Reported on 03/11/2020) 60 tablet 1  . aspirin EC 81 MG tablet Take 1 tablet (81  mg total) by mouth daily. (Patient not taking: Reported on 02/11/2020) 60 tablet 3  . cholecalciferol (VITAMIN D) 25 MCG tablet Take 2 tablets (2,000 Units total) by mouth daily. (Patient not taking: Reported on 03/11/2020) 30 tablet 1  . hydrOXYzine (ATARAX/VISTARIL) 25 MG tablet Take 25 mg by mouth 2 (two) times daily as needed for anxiety.    [name] nystatin (MYCOSTATIN) 100000 UNIT/ML suspension Take 5 mLs (500,000 Units total) by mouth 4 (four) times daily. (Patient not taking: Reported on 03/11/2020) 60 mL 0  .  simethicone (MYLICON) 80 MG chewable tablet Chew 1 tablet (80 mg total) by mouth 4 (four) times daily as needed for flatulence. (Patient not taking: Reported on 03/11/2020) 30 tablet 0   No facility-administered medications prior to visit.      Review of Systems  Constitutional: Positive for appetite change and fatigue. Negative for fever and unexpected weight change.  HENT: Negative.   Eyes: Negative for visual disturbance.  Respiratory: Positive for shortness of breath. Negative for apnea, cough, choking, chest tightness, wheezing and stridor.   Cardiovascular: Negative for chest pain, palpitations and leg swelling.  Gastrointestinal: Positive for diarrhea. Negative for nausea and vomiting.  Endocrine: Positive for polydipsia and polyuria.  Genitourinary: Negative.   Skin: Negative.   Neurological: Negative.   Hematological: Negative.   Psychiatric/Behavioral: Positive for dysphoric mood. Negative for self-injury, sleep disturbance and suicidal ideas. The patient is nervous/anxious.        Objective:   Physical Exam Vitals:   03/11/20 1047  BP: 137/82  Pulse: 90  Temp: 98.7 F (37.1 C)  SpO2: 100%  Weight: 189 lb 9.6 oz (86 kg)  Height: '5\' 4"'$  (1.626 m)    Gen: Pleasant, well-nourished, in no distress, anxious affect  ENT: No lesions,  mouth clear,  oropharynx clear, no postnasal drip  Neck: No JVD, no TMG, no carotid bruits  Lungs: No use of accessory muscles, no  dullness to percussion, clear without rales or rhonchi  Cardiovascular: paced rhythm, heart sounds normal, no murmur   +S3gallops, no peripheral edema  Abdomen: soft mildly tender epigastric area, no HSM,  BS normal  Musculoskeletal: No deformities, no cyanosis or clubbing  Neuro: alert, non focal  Skin: Warm, no lesions or rashes CBG 342 03/11/2020   BMP Latest Ref Rng & Units 02/26/2020 02/25/2020 02/24/2020  Glucose 70 - 99 mg/dL 120(H) 202(H) 258(H)  BUN 6 - 20 mg/dL 5(L) 6 6  Creatinine 0.44 - 1.00 mg/dL 0.76 0.80 0.83  BUN/Creat Ratio 9 - 23 - - -  Sodium 135 - 145 mmol/L 137 137 136  Potassium 3.5 - 5.1 mmol/L 3.5 3.5 3.4(L)  Chloride 98 - 111 mmol/L 104 105 103  CO2 22 - 32 mmol/L '23 24 24  '$ Calcium 8.9 - 10.3 mg/dL 8.0(L) 8.0(L) 7.9(L)   Hepatic Function Latest Ref Rng & Units 02/22/2020 02/21/2020 02/19/2020  Total Protein 6.5 - 8.1 g/dL 5.9(L) 6.1(L) -  Albumin 3.5 - 5.0 g/dL 1.5(L) 1.5(L) 1.6(L)  AST 15 - 41 U/L 23 22 -  ALT 0 - 44 U/L 24 29 -  Alk Phosphatase 38 - 126 U/L 122 112 -  Total Bilirubin 0.3 - 1.2 mg/dL 0.6 0.5 -  Bilirubin, Direct 0.0 - 0.2 mg/dL - - -   CBC Latest Ref Rng & Units 02/26/2020 02/25/2020 02/24/2020  WBC 4.0 - 10.5 K/uL 10.8(H) 12.8(H) 14.3(H)  Hemoglobin 12.0 - 15.0 g/dL 8.0(L) 8.5(L) 8.6(L)  Hematocrit 36.0 - 46.0 % 25.6(L) 26.9(L) 27.5(L)  Platelets 150 - 400 K/uL 344 328 310   Lipase     Component Value Date/Time   LIPASE 115 (H) 02/26/2020 0348        Assessment & Plan:  I personally reviewed all images and lab data in the [facility] system as well as any outside material available during this office visit and agree with the  radiology impressions.   Acute on chronic combined systolic and diastolic CHF (congestive heart failure) (HCC) Acute on chronic systolic and diastolic heart failure secondary to nonischemic cardiomyopathy likely  exacerbated by alcohol use and electrolyte disorders  Associated complete heart block now pacemaker  dependent  I am going to endeavor to have this patient into the chronic heart failure clinic  The patient appears to be dry today therefore I am going to have the patient hold Lasix for nowLikely end up holding potassium will get basic metabolic panel visit  Note this patient is not on a beta-blocker at this time because of complete heart block she will not be on an ARB due to her kidney disease  We will obtain a scale for this patient    Heart block AV complete (HCC) Complete heart block in this patient now pacemaker dependent  HYPERTENSION, BENIGN SYSTEMIC Hypertension with now well-controlled on current  Acute pancreatitis without infection or necrosis Acute pancreatitis without infection or necrosis secondary alcohol use  We will continue pancreatic enzyme replacement and as well will endeavor to obtain gastroenterology follow-up appointment as an outpatient  Diabetes type 2, uncontrolled ([place]) Uncontrolled diabetes type 2 with history of DKA and multiple electrolyte disorders  Will adjust insulin at this time by changing to Basaglar insulin at 44 units daily and begin a sliding scale NovoLog ranging from 2 to 12 units 3 times daily based on glucose levels  The patient spent time with our licensed clinical pharmacist and will follow up with him as well    Diabetic ketoacidosis without coma associated with type 2 diabetes mellitus ([place]) We will follow-up metabolic profile and based on patient's exam I suspect DKA is now resolved  Non-traumatic rhabdomyolysis Severe rhabdomyolysis nontraumatic likely resolved follow-up creatinine kinase levels  AKI (acute kidney injury) ([place]) Acute kidney injury will follow up metabolic panel  Alcohol use Will need connect the mental health services with ongoing use and bipolar disorder  Bipolar 1 disorder ([place]) As per alcohol use assessment  Hypomagnesemia Will obtain magnesium level and phosphorus level at this  visit  Hypophosphatemia As per hypomagnesemia assessment  Prolonged QT interval EKG today obtained shows paced rhythm and prolonged QTC persisting   follow-up with cardiology  Smoking Patient currently is not actively smoking tobacco products  Vitamin D deficiency Continue vitamin D supplementation follow-up vitamin D levels   Diagnoses and all orders for this visit:  Uncontrolled type 2 diabetes mellitus with hyperglycemia ([place]) -     Comprehensive metabolic panel -     Microalbumin, urine -     Glucose (CBG)  Prolonged QT interval -     EKG 12-Lead  Alcohol-induced acute pancreatitis without infection or necrosis -     Lipase -     CBC with Differential/Platelet -     Ambulatory referral to Gastroenterology  Non-traumatic rhabdomyolysis -     CK -     CBC with Differential/Platelet  Acute on chronic combined systolic and diastolic CHF (congestive heart failure) ([place]) -     AMB referral to CHF clinic  Hypomagnesemia -     Magnesium; Future -     Magnesium  Vitamin D deficiency -     VITAMIN D 25 Hydroxy (Vit-D Deficiency, Fractures)  Hypophosphatemia -     Phosphorus  Hypercholesteremia  Bipolar 1 disorder (HCC)  HYPERTENSION, BENIGN SYSTEMIC  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 block)  Complete heart block (HCC)  Heart block AV complete (HCC)  Smoking  Alcohol use  AKI (acute kidney injury) ([place])  Diabetic ketoacidosis without coma associated with type 2 diabetes mellitus ([place])  Other orders -  ferrous sulfate 325 (65 FE) MG tablet; Take 1 tablet (325 mg total) by mouth 2 (two) times daily with a meal. -     dicyclomine (BENTYL) 10 MG capsule; Take 1 capsule (10 mg total) by mouth 3 (three) times daily before meals for 7 days. -     furosemide (LASIX) 20 MG tablet; HOLD FOR NOW -     insulin aspart (NOVOLOG FLEXPEN) 100 UNIT/ML FlexPen; Sliding scale three times daily: 0-150: 0 151-200: 2 units  201-250: 4 units   251-300: 6 units  301-350: 8 units  351- 400 10 units   >400  12 units -     Discontinue: Insulin Glargine (BASAGLAR KWIKPEN) 100 UNIT/ML; Inject 0.44 mLs (44 Units total) into the skin daily. -     Vitamin D, Ergocalciferol, (DRISDOL) 1.25 MG (50000 UNIT) CAPS capsule; Take 1 capsule (50,000 Units total) by mouth every 7 (seven) days. -     Misc. Devices (DIGITAL GLASS SCALE) MISC; Weigh daily

## 2020-03-11 NOTE — Assessment & Plan Note (Signed)
Will need connect the mental health services with ongoing use and bipolar disorder

## 2020-03-11 NOTE — Assessment & Plan Note (Signed)
Severe rhabdomyolysis nontraumatic likely resolved follow-up creatinine kinase levels

## 2020-03-11 NOTE — Telephone Encounter (Signed)
Can you resend the basaglar for the lantus?  Pt has Medicaid and they prefer Lantus

## 2020-03-11 NOTE — Patient Instructions (Addendum)
All of your medication refills were sent to our pharmacy  Hold your Lasix for now  Referral to gastroenterology and cardiology was made  Follow the new sliding scale insulin for your short acting insulin and change your Lantus to 44 units daily  Follow a low-salt diet for your heart failure as outlined below  Ultimately labs were sent today along with a urine study to check the status of your diabetes and your variety of electrolyte abnormalities and the status of your pancreatitis and liver function  Completely abstain from alcohol and tobacco use  Take melatonin 10mg  for sleep  You need to reconnect with a [facility] health provider at [facility] can try ibuprofen 200mg  4 times daily for pain.  No tylenol, I cannot give you opiates due to your liver condition  Return to see Dr. Joya Gaskins in 2 weeks   Heart Failure Eating Plan Heart failure, also called congestive heart failure, occurs when your heart does not pump blood well enough to meet your body's needs for oxygen-rich blood. Heart failure is a long-term (chronic) condition. Living with heart failure can be challenging. However, following your health care provider's instructions about a healthy lifestyle and working with a diet and nutrition specialist (dietitian) to choose the right foods may help to improve your symptoms. What are tips for following this plan? Reading food labels  Check food labels for the amount of sodium per serving. Choose foods that have less than 140 mg (milligrams) of sodium in each serving.  Check food labels for the number of calories per serving. This is important if you need to limit your daily calorie intake to lose weight.  Check food labels for the serving size. If you eat more than one serving, you will be eating more sodium and calories than what is listed on the label.  Look for foods that are labeled as "sodium-free," "very low sodium," or "low sodium." ? Foods labeled as "reduced sodium" or  "lightly salted" may still have more sodium than what is recommended for you. Cooking  Avoid adding salt when cooking. Ask your health care provider or dietitian before using salt substitutes.  Season food with salt-free seasonings, spices, or herbs. Check the label of seasoning mixes to make sure they do not contain salt.  Cook with heart-healthy oils, such as olive, canola, soybean, or sunflower oil.  Do not fry foods. Cook foods using low-fat methods, such as baking, boiling, grilling, and broiling.  Limit unhealthy fats when cooking by: ? Removing the skin from poultry, such as chicken. ? Removing all visible fats from meats. ? Skimming the fat off from stews, soups, and gravies before serving them. Meal planning   Limit your intake of: ? Processed, canned, or pre-packaged foods. ? Foods that are high in trans fat, such as fried foods. ? Sweets, desserts, sugary drinks, and other foods with added sugar. ? Full-fat dairy products, such as whole milk.  Eat a balanced diet that includes: ? 4-5 servings of fruit each day and 4-5 servings of vegetables each day. At each meal, try to fill half of your plate with fruits and vegetables. ? Up to 6-8 servings of whole grains each day. ? Up to 2 servings of lean meat, poultry, or fish each day. One serving of meat is equal to 3 oz. This is about the same size as a deck of cards. ? 2 servings of low-fat dairy each day. ? Heart-healthy fats. Healthy fats called omega-3 fatty acids are found  in foods such as flaxseed and cold-water fish like sardines, salmon, and mackerel.  Aim to eat 25-35 g (grams) of fiber a day. Foods that are high in fiber include apples, broccoli, carrots, beans, peas, and whole grains.  Do not add salt or condiments that contain salt (such as soy sauce) to foods before eating.  When eating at a restaurant, ask that your food be prepared with less salt or no salt, if possible.  Try to eat 2 or more vegetarian meals  each week.  Eat more home-cooked food and eat less restaurant, buffet, and fast food. General information  Do not eat more than 2,300 mg of salt (sodium) a day. The amount of sodium that is recommended for you may be lower, depending on your condition.  Maintain a healthy body weight as directed. Ask your health care provider what a healthy weight is for you. ? Check your weight every day. ? Work with your health care provider and dietitian to make a plan that is right for you to lose weight or maintain your current weight.  Limit how much fluid you drink. Ask your health care provider or dietitian how much fluid you can have each day.  Limit or avoid alcohol as told by your health care provider or dietitian. Recommended foods The items listed may not be a complete list. Talk with your dietitian about what dietary choices are best for you. Fruits All fresh, frozen, and canned fruits. Dried fruits, such as raisins, prunes, and cranberries. Vegetables All fresh vegetables. Vegetables that are frozen without sauce or added salt. Low-sodium or sodium-free canned vegetables. Grains Bread with less than 80 mg of sodium per slice. Whole-wheat pasta, quinoa, and brown rice. Oats and oatmeal. Barley. [place]. Grits and cream of wheat. Whole-grain and whole-wheat cold cereal. Meats and other protein foods Lean cuts of meat. Skinless chicken and [place]. Fish with high omega-3 fatty acids, such as salmon, sardines, and other cold-water fishes. Eggs. Dried beans, peas, and edamame. Unsalted nuts and nut butters. Dairy Low-fat or nonfat (skim) milk and dried milk. Rice milk, soy milk, and almond milk. Low-fat or nonfat yogurt. Small amounts of reduced-sodium block cheese. Low-sodium cottage cheese. Fats and oils Olive, canola, soybean, flaxseed, or sunflower oil. Avocado. Sweets and desserts Apple sauce. Granola bars. Sugar-free pudding and gelatin. Frozen fruit bars. Seasoning and other foods Fresh  and dried herbs. Lemon or lime juice. Vinegar. Low-sodium ketchup. Salt-free marinades, salad dressings, sauces, and seasonings. The items listed above may not be a complete list of foods and beverages you can eat. Contact a dietitian for more information. Foods to avoid The items listed may not be a complete list. Talk with your dietitian about what dietary choices are best for you. Fruits Fruits that are dried with sodium-containing preservatives. Vegetables Canned vegetables. Frozen vegetables with sauce or seasonings. Creamed vegetables. [place] fries. Onion rings. Pickled vegetables and sauerkraut. Grains Bread with more than 80 mg of sodium per slice. Hot or cold cereal with more than 140 mg sodium per serving. Salted pretzels and crackers. Pre-packaged breadcrumbs. Bagels, croissants, and biscuits. Meats and other protein foods Ribs and chicken wings. Bacon, ham, pepperoni, bologna, salami, and packaged luncheon meats. Hot dogs, bratwurst, and sausage. Canned meat. Smoked meat and fish. Salted nuts and seeds. Dairy Whole milk, half-and-half, and cream. Buttermilk. Processed cheese, cheese spreads, and cheese curds. Regular cottage cheese. Feta cheese. Shredded cheese. String cheese. Fats and oils Butter, lard, shortening, ghee, and bacon fat. Canned and packaged  gravies. Seasoning and other foods Onion salt, garlic salt, table salt, and sea salt. Marinades. Regular salad dressings. Relishes, pickles, and olives. Meat flavorings and tenderizers, and bouillon cubes. Horseradish, ketchup, and mustard. Worcestershire sauce. Teriyaki sauce, soy sauce (including reduced sodium). Hot sauce and Tabasco sauce. Steak sauce, fish sauce, oyster sauce, and cocktail sauce. Taco seasonings. Barbecue sauce. Tartar sauce. The items listed above may not be a complete list of foods and beverages you should avoid. Contact a dietitian for more information. Summary  A heart failure eating plan includes changes  that limit your intake of sodium and unhealthy fat, and it may help you lose weight or maintain a healthy weight. Your health care provider may also recommend limiting how much fluid you drink.  Most people with heart failure should eat no more than 2,300 mg of salt (sodium) a day. The amount of sodium that is recommended for you may be lower, depending on your condition.  Contact your health care provider or dietitian before making any major changes to your diet. This information is not intended to replace advice given to you by your health care provider. Make sure you discuss any questions you have with your health care provider. Document Revised: 01/18/2019 Document Reviewed: 04/08/2017 Elsevier Patient Education  [place].  Heart Failure, Diagnosis  Heart failure means that your heart is not able to pump blood in the right way. This makes it hard for your body to work well. Heart failure is usually a long-term (chronic) condition. You must take good care of yourself and follow your treatment plan from your doctor. What are the causes? This condition may be caused by:  High blood pressure.  Build up of cholesterol and fat in the arteries.  Heart attack. This injures the heart muscle.  Heart valves that do not open and close properly.  Damage of the heart muscle. This is also called cardiomyopathy.  Lung disease.  Abnormal heart rhythms. What increases the risk? The risk of heart failure goes up as a person ages. This condition is also more likely to develop in people who:  Are overweight.  Are female.  Smoke or chew tobacco.  Abuse alcohol or illegal drugs.  Have taken medicines that can damage the heart.  Have diabetes.  Have abnormal heart rhythms.  Have thyroid problems.  Have low blood counts (anemia). What are the signs or symptoms? Symptoms of this condition include:  Shortness of breath.  Coughing.  Swelling of the feet, ankles, legs, or  belly.  Losing weight for no reason.  Trouble breathing.  Waking from sleep because of the need to sit up and get more air.  Rapid heartbeat.  Being very tired.  Feeling dizzy, or feeling like you may pass out (faint).  Having no desire to eat.  Feeling like you may vomit (nauseous).  Peeing (urinating) more at night.  Feeling confused. How is this treated?     This condition may be treated with:  Medicines. These can be given to treat blood pressure and to make the heart muscles stronger.  Changes in your daily life. These may include eating a healthy diet, staying at a healthy body weight, quitting tobacco and illegal drug use, or doing exercises.  Surgery. Surgery can be done to open blocked valves, or to put devices in the heart, such as pacemakers.  A donor heart (heart transplant). You will receive a healthy heart from a donor. Follow these instructions at home:  Treat other conditions as  told by your doctor. These may include high blood pressure, diabetes, thyroid disease, or abnormal heart rhythms.  Learn as much as you can about heart failure.  Get support as you need it.  Keep all follow-up visits as told by your doctor. This is important. Summary  Heart failure means that your heart is not able to pump blood in the right way.  This condition is caused by high blood pressure, heart attack, or damage of the heart muscle.  Symptoms of this condition include shortness of breath and swelling of the feet, ankles, legs, or belly. You may also feel very tired or feel like you may vomit.  You may be treated with medicines, surgery, or changes in your daily life.  Treat other health conditions as told by your doctor. This information is not intended to replace advice given to you by your health care provider. Make sure you discuss any questions you have with your health care provider. Document Revised: 02/09/2019 Document Reviewed: 02/09/2019 Elsevier Patient  Education  [place], Adult  Tobacco is a leafy plant that contains a chemical (nicotine). Nicotine affects your brain and causes you to become addicted to it. Smokeless tobacco is tobacco that you put in your mouth instead of smoking it. It may also be called chewing tobacco or snuff. Smokeless tobacco is made from the leaves of tobacco plants and comes in many forms, such as:  Loose, dry leaves.  Plugs or twists.  Moist pouches.  Dissolving lozenges or strips. Chewing, sucking, or holding the tobacco in your mouth causes your mouth to make more saliva. The saliva mixes with the tobacco, which you swallow or spit out. Using tobacco is harmful to your health. How can smokeless tobacco affect me? All forms of tobacco contain many chemicals that can harm every organ in the body. Using smokeless tobacco increases your risk for:  Cancer. Tobacco use is one of the leading causes of cancer. Smokeless tobacco is linked to cancer of the mouth, esophagus, and pancreas.  Other long-term health problems, including high blood pressure, heart disease, and stroke.  Addiction.  Pregnancy complications, if this applies. Pregnant women who use smokeless tobacco are more likely to have a miscarriage or deliver a baby too early.  Mouth and dental problems, such as: ? Bad breath. ? Teeth that look yellow or brown. ? Mouth sores. ? Cracking and bleeding lips. ? Gum recession, gum disease, or tooth decay. ? Lesions on the soft tissues of your mouth (leukoplakia). The benefits of not using smokeless tobacco include:  A healthy mind and body.  Saving money. You avoid the cost of buying tobacco and the cost of treating illnesses that are caused by tobacco. What actions can I take to quit using tobacco? Ask your health care provider for help to quit using smokeless tobacco. This may involve treatment. These tips may also help you quit:  Pick a date to quit.  Set a date within the next two weeks. This gives you time to prepare.  Write down the reasons why you are quitting. Keep this list in places where you will see it often, such as on your bathroom mirror or in your car or wallet.  Identify the people, places, things, and activities that make you want to use smokeless tobacco (triggers). Avoid them.  Get rid of any tobacco you have and remove any tobacco smells. To do this: ? Throw away all containers of tobacco at home, at work, and  in your car. ? Throw away any other items that you use regularly when you chew tobacco. ? Clean your car and make sure to remove all tobacco-related items. ? Clean your home, including curtains and carpets.  Tell your family and friends that you are quitting. Having support can make quitting easier.  Chew sugarless gum or sunflower seeds when you want to use smokeless tobacco.  Stay positive. Be prepared for cravings. It is common to slip up when you first quit, so take it one day at a time.  Stay busy and take care of your body. Get plenty of exercise. Drink enough water to keep your urine pale yellow.  Keep track of how many days have passed since you quit. Remembering how long and hard you have worked to quit can help you avoid using smokeless tobacco again. Where to find support Ask your health care provider if there is a local support group for quitting smokeless tobacco. Where to find more information You can learn more about the risks of using smokeless tobacco and the benefits of quitting from these sources:  [place]: www.cancer.[place]: www.cancer.gov  Centers for Disease Control and Prevention: http://www.wolf.info/ Contact a health care provider if you have:  Trouble quitting smokeless tobacco use on your own.  White or other discolored patches in your mouth.  Difficulty swallowing.  A change in your voice.  Unexplained weight loss.  Stomach pain, nausea, or  vomiting. Summary  Smokeless tobacco contains many different chemicals that are known to cause cancer.  Nicotine is an addictive chemical in smokeless tobacco that affects your brain.  The benefits of not using smokeless tobacco include having a healthy mind and body and saving money.  Tell your family and friends that you are quitting. Having support can make quitting easier.  Ask your health care provider for help quitting smokeless tobacco. This may involve treatment. This information is not intended to replace advice given to you by your health care provider. Make sure you discuss any questions you have with your health care provider. Document Revised: 08/17/2019 Document Reviewed: 01/29/2019 Elsevier Patient Education  [place].

## 2020-03-11 NOTE — Assessment & Plan Note (Signed)
As per alcohol use assessment

## 2020-03-11 NOTE — Assessment & Plan Note (Signed)
Acute pancreatitis without infection or necrosis secondary alcohol use  We will continue pancreatic enzyme replacement and as well will endeavor to obtain gastroenterology follow-up appointment as an outpatient

## 2020-03-11 NOTE — Assessment & Plan Note (Signed)
Patient currently is not actively smoking tobacco products

## 2020-03-11 NOTE — Assessment & Plan Note (Addendum)
Acute on chronic systolic and diastolic heart failure secondary to nonischemic cardiomyopathy likely exacerbated by alcohol use and electrolyte disorders  Associated complete heart block now pacemaker dependent  I am going to endeavor to have this patient into the chronic heart failure clinic  The patient appears to be dry today therefore I am going to have the patient hold Lasix for nowLikely end up holding potassium will get basic metabolic panel visit  Note this patient is not on a beta-blocker at this time because of complete heart block she will not be on an ARB due to her kidney disease  We will obtain a scale for this patient

## 2020-03-11 NOTE — Assessment & Plan Note (Signed)
Uncontrolled diabetes type 2 with history of DKA and multiple electrolyte disorders  Will adjust insulin at this time by changing to Basaglar insulin at 44 units daily and begin a sliding scale NovoLog ranging from 2 to 12 units 3 times daily based on glucose levels  The patient spent time with our licensed clinical pharmacist and will follow up with him as well

## 2020-03-11 NOTE — Assessment & Plan Note (Addendum)
Acute kidney injury will follow up metabolic panel

## 2020-03-11 NOTE — Assessment & Plan Note (Signed)
As per hypomagnesemia assessment

## 2020-03-11 NOTE — Assessment & Plan Note (Signed)
Will obtain magnesium level and phosphorus level at this visit

## 2020-03-11 NOTE — Progress Notes (Signed)
HFU Dizziness and lightheaded   CBG 342

## 2020-03-12 LAB — COMPREHENSIVE METABOLIC PANEL
ALT: 21 IU/L (ref 0–32)
AST: 63 IU/L — ABNORMAL HIGH (ref 0–40)
Albumin/Globulin Ratio: 1 — ABNORMAL LOW (ref 1.2–2.2)
Albumin: 3.8 g/dL (ref 3.8–4.8)
Alkaline Phosphatase: 174 IU/L — ABNORMAL HIGH (ref 39–117)
BUN/Creatinine Ratio: 9 (ref 9–23)
BUN: 8 mg/dL (ref 6–24)
Bilirubin Total: 0.2 mg/dL (ref 0.0–1.2)
CO2: 24 mmol/L (ref 20–29)
Calcium: 9.2 mg/dL (ref 8.7–10.2)
Chloride: 98 mmol/L (ref 96–106)
Creatinine, Ser: 0.87 mg/dL (ref 0.57–1.00)
GFR calc Af Amer: 92 mL/min/{1.73_m2} (ref >59)
GFR calc non Af Amer: 80 mL/min/{1.73_m2} (ref >59)
Globulin, Total: 4 g/dL (ref 1.5–4.5)
Glucose: 359 mg/dL — ABNORMAL HIGH (ref 65–99)
Potassium: 4 mmol/L (ref 3.5–5.2)
Sodium: 138 mmol/L (ref 134–144)
Total Protein: 7.8 g/dL (ref 6.0–8.5)

## 2020-03-12 LAB — CBC WITH DIFFERENTIAL/PLATELET
Basophils Absolute: 0 10*3/uL (ref 0.0–0.2)
Basos: 1 %
EOS (ABSOLUTE): 0.3 10*3/uL (ref 0.0–0.4)
Eos: 5 %
Hematocrit: 36.5 % (ref 34.0–46.6)
Hemoglobin: 11.5 g/dL (ref 11.1–15.9)
Immature Grans (Abs): 0 10*3/uL (ref 0.0–0.1)
Immature Granulocytes: 0 %
Lymphocytes Absolute: 1.8 10*3/uL (ref 0.7–3.1)
Lymphs: 28 %
MCH: 30 pg (ref 26.6–33.0)
MCHC: 31.5 g/dL (ref 31.5–35.7)
MCV: 95 fL (ref 79–97)
Monocytes Absolute: 0.3 10*3/uL (ref 0.1–0.9)
Monocytes: 4 %
Neutrophils Absolute: 4.2 10*3/uL (ref 1.4–7.0)
Neutrophils: 62 %
Platelets: 307 10*3/uL (ref 150–450)
RBC: 3.83 x10E6/uL (ref 3.77–5.28)
RDW: 14.5 % (ref 11.7–15.4)
WBC: 6.6 10*3/uL (ref 3.4–10.8)

## 2020-03-12 LAB — MICROALBUMIN, URINE: Microalbumin, Urine: 827 ug/mL

## 2020-03-12 LAB — PHOSPHORUS: Phosphorus: 3.8 mg/dL (ref 3.0–4.3)

## 2020-03-12 LAB — VITAMIN D 25 HYDROXY (VIT D DEFICIENCY, FRACTURES): Vit D, 25-Hydroxy: 24.1 ng/mL — ABNORMAL LOW (ref 30.0–100.0)

## 2020-03-12 LAB — CK: Total CK: 71 U/L (ref 32–182)

## 2020-03-12 LAB — LIPASE: Lipase: 420 U/L — ABNORMAL HIGH (ref 14–72)

## 2020-03-12 LAB — MAGNESIUM: Magnesium: 1.4 mg/dL — ABNORMAL LOW (ref 1.6–2.3)

## 2020-03-20 ENCOUNTER — Other Ambulatory Visit: Payer: Self-pay | Admitting: Pharmacist

## 2020-03-20 DIAGNOSIS — E1165 Type 2 diabetes mellitus with hyperglycemia: Secondary | ICD-10-CM

## 2020-03-20 MED ORDER — ACCU-CHEK SOFTCLIX LANCETS MISC: 6 refills | Status: DC

## 2020-03-20 MED ORDER — ACCU-CHEK GUIDE ME W/DEVICE KIT: 1.0000 | PACK | Freq: Three times a day (TID) | 0 refills | Status: DC

## 2020-03-20 MED ORDER — ACCU-CHEK GUIDE VI STRP: ORAL_STRIP | 6 refills | Status: DC

## 2020-03-20 MED FILL — ACCU-CHEK SOFTCLIX LANCETS: 30 days supply | Qty: 100 | Fill #0

## 2020-03-20 MED FILL — ACCU-CHEK GUIDE W/DEVICE KI: W/DEVICE | 30 days supply | Qty: 1 | Fill #0

## 2020-03-20 MED FILL — ACCU-CHEK GUIDE TEST STRIP: 30 days supply | Qty: 100 | Fill #0

## 2020-03-25 ENCOUNTER — Other Ambulatory Visit: Payer: Self-pay

## 2020-03-25 ENCOUNTER — Encounter: Payer: Self-pay | Admitting: Pharmacist

## 2020-03-25 ENCOUNTER — Ambulatory Visit: Payer: Medicaid Other | Attending: Critical Care Medicine | Admitting: Pharmacist

## 2020-03-25 DIAGNOSIS — Z8249 Family history of ischemic heart disease and other diseases of the circulatory system: Secondary | ICD-10-CM | POA: Diagnosis not present

## 2020-03-25 DIAGNOSIS — E119 Type 2 diabetes mellitus without complications: Secondary | ICD-10-CM | POA: Diagnosis present

## 2020-03-25 DIAGNOSIS — Z599 Problem related to housing and economic circumstances, unspecified: Secondary | ICD-10-CM | POA: Insufficient documentation

## 2020-03-25 DIAGNOSIS — E1165 Type 2 diabetes mellitus with hyperglycemia: Secondary | ICD-10-CM

## 2020-03-25 DIAGNOSIS — Z833 Family history of diabetes mellitus: Secondary | ICD-10-CM | POA: Diagnosis not present

## 2020-03-25 DIAGNOSIS — F17210 Nicotine dependence, cigarettes, uncomplicated: Secondary | ICD-10-CM | POA: Diagnosis not present

## 2020-03-25 DIAGNOSIS — Z794 Long term (current) use of insulin: Secondary | ICD-10-CM | POA: Diagnosis not present

## 2020-03-25 NOTE — Progress Notes (Signed)
     S:    PCP: Dr. Joya Gaskins  No chief complaint on file.  Patient arrives in good spirits. Presents for diabetes evaluation, education, and management. Patient was referred and last seen by PCP on 03/11/20.    Family/Social History:  - FHx: DM (mother, father); HD (mother, father); HTN (mother, father) - Tobacco: current 0.5 PPD smoker - Alcohol: denies use  Insurance coverage/medication affordability: [place] Medicaid  Patient reports adherence with medications.  Current diabetes medications include: Basaglar 50 units daily, Novolog TID sliding scale (uses 6-12 units BID)  Patient denies hypoglycemic events.  Patient reported dietary habits:  - Pt w/ hx of homelessness; now living in a hotel and eats what she can - She states that she has difficulty eating regularly.   - She receives mostly processed foods including boiled eggs, orange juice, bacon, and sandwich meats   Patient-reported exercise habits:  - Limited   Patient denies polyuria. Reports some polydipsia.  Patient reports neuropathy. Patient reports visual changes. Patient reports self foot exams.     O:  POCT glucose: 265  Home glucose levels: not checking consistently   Lab Results  Component Value Date   HGBA1C 12.7 (H) 02/11/2020   There were no vitals filed for this visit.  Lipid Panel     Component Value Date/Time   CHOL 53 02/23/2020 0309   CHOL 120 03/17/2017 0000   TRIG 84 02/23/2020 0309   HDL 19 (L) 02/23/2020 0309   HDL 54 03/17/2017 0000   CHOLHDL 2.8 02/23/2020 0309   VLDL 17 02/23/2020 0309   LDLCALC 17 02/23/2020 0309   LDLCALC 30 03/17/2017 0000   Clinical ASCVD: No  The ASCVD Risk score Mikey Bussing DC Jr., et al., 2013) failed to calculate for the following reasons:   The patient has a prior MI or stroke diagnosis    A/P: Diabetes longstanding currently uncontrolled. Patient is able to verbalize appropriate hypoglycemia management plan. Patient is  adherent with medication and has  titrated basal insulin to 50 units daily. Control is suboptimal due to financial constraints, food availability, and medication non-adherence.   -Increase Basaglar to 60 units daily. Continue to titrate by 2 units q3days until fasting CBGs are at goal. Max 70 units.  -Schedule Novolog to 10 units before meals.  -Extensively discussed pathophysiology of DM, recommended lifestyle interventions, dietary effects on glycemic control -Counseled on s/sx of and management of hypoglycemia -Next A1C anticipated 05/2020.   Written patient instructions provided. Total time in face to face counseling 30 minutes.   Follow up in 2 weeks.  Benard Halsted, PharmD, [place] [phone]

## 2020-03-26 MED FILL — CREON DR 12,000 UNITS CAP: 12000 | 16 days supply | Qty: 100 | Fill #0

## 2020-03-26 MED FILL — FOLIC ACID 1 MG TABS: 1 | 30 days supply | Qty: 30 | Fill #0

## 2020-04-01 ENCOUNTER — Telehealth: Payer: Self-pay

## 2020-04-01 ENCOUNTER — Other Ambulatory Visit: Payer: Self-pay

## 2020-04-01 ENCOUNTER — Encounter: Payer: Self-pay | Admitting: Critical Care Medicine

## 2020-04-01 ENCOUNTER — Ambulatory Visit: Payer: Medicaid Other | Attending: Critical Care Medicine | Admitting: Critical Care Medicine

## 2020-04-01 VITALS — BP 148/84 | HR 95 | Temp 97.7°F | Ht 64.0 in | Wt 192.8 lb

## 2020-04-01 DIAGNOSIS — Z794 Long term (current) use of insulin: Secondary | ICD-10-CM | POA: Diagnosis not present

## 2020-04-01 DIAGNOSIS — K029 Dental caries, unspecified: Secondary | ICD-10-CM | POA: Diagnosis not present

## 2020-04-01 DIAGNOSIS — I5042 Chronic combined systolic (congestive) and diastolic (congestive) heart failure: Secondary | ICD-10-CM | POA: Insufficient documentation

## 2020-04-01 DIAGNOSIS — K852 Alcohol induced acute pancreatitis without necrosis or infection: Secondary | ICD-10-CM

## 2020-04-01 DIAGNOSIS — I110 Hypertensive heart disease with heart failure: Secondary | ICD-10-CM | POA: Insufficient documentation

## 2020-04-01 DIAGNOSIS — E785 Hyperlipidemia, unspecified: Secondary | ICD-10-CM | POA: Diagnosis not present

## 2020-04-01 DIAGNOSIS — Z882 Allergy status to sulfonamides status: Secondary | ICD-10-CM | POA: Insufficient documentation

## 2020-04-01 DIAGNOSIS — Z87891 Personal history of nicotine dependence: Secondary | ICD-10-CM | POA: Insufficient documentation

## 2020-04-01 DIAGNOSIS — Z9111 Patient's noncompliance with dietary regimen: Secondary | ICD-10-CM | POA: Diagnosis not present

## 2020-04-01 DIAGNOSIS — E559 Vitamin D deficiency, unspecified: Secondary | ICD-10-CM | POA: Insufficient documentation

## 2020-04-01 DIAGNOSIS — E1165 Type 2 diabetes mellitus with hyperglycemia: Secondary | ICD-10-CM | POA: Diagnosis not present

## 2020-04-01 DIAGNOSIS — K859 Acute pancreatitis without necrosis or infection, unspecified: Secondary | ICD-10-CM | POA: Insufficient documentation

## 2020-04-01 DIAGNOSIS — I10 Essential (primary) hypertension: Secondary | ICD-10-CM

## 2020-04-01 DIAGNOSIS — F419 Anxiety disorder, unspecified: Secondary | ICD-10-CM | POA: Diagnosis not present

## 2020-04-01 DIAGNOSIS — Z590 Homelessness: Secondary | ICD-10-CM | POA: Diagnosis not present

## 2020-04-01 DIAGNOSIS — Z8249 Family history of ischemic heart disease and other diseases of the circulatory system: Secondary | ICD-10-CM | POA: Diagnosis not present

## 2020-04-01 DIAGNOSIS — F319 Bipolar disorder, unspecified: Secondary | ICD-10-CM | POA: Diagnosis not present

## 2020-04-01 DIAGNOSIS — K0381 Cracked tooth: Secondary | ICD-10-CM | POA: Insufficient documentation

## 2020-04-01 DIAGNOSIS — I442 Atrioventricular block, complete: Secondary | ICD-10-CM | POA: Insufficient documentation

## 2020-04-01 DIAGNOSIS — I5043 Acute on chronic combined systolic (congestive) and diastolic (congestive) heart failure: Secondary | ICD-10-CM

## 2020-04-01 DIAGNOSIS — F17200 Nicotine dependence, unspecified, uncomplicated: Secondary | ICD-10-CM

## 2020-04-01 DIAGNOSIS — Z950 Presence of cardiac pacemaker: Secondary | ICD-10-CM | POA: Diagnosis not present

## 2020-04-01 DIAGNOSIS — R9431 Abnormal electrocardiogram [ECG] [EKG]: Secondary | ICD-10-CM | POA: Diagnosis not present

## 2020-04-01 DIAGNOSIS — Z79899 Other long term (current) drug therapy: Secondary | ICD-10-CM | POA: Insufficient documentation

## 2020-04-01 DIAGNOSIS — Z833 Family history of diabetes mellitus: Secondary | ICD-10-CM | POA: Diagnosis not present

## 2020-04-01 DIAGNOSIS — I252 Old myocardial infarction: Secondary | ICD-10-CM | POA: Diagnosis not present

## 2020-04-01 MED ORDER — BIOTENE PBF DRY MOUTH MT LIQD: OROMUCOSAL | 1 refills | Status: DC

## 2020-04-01 MED ORDER — DIGITAL GLASS SCALE MISC: 0 refills | Status: DC

## 2020-04-01 NOTE — Progress Notes (Signed)
2 wks f/u

## 2020-04-01 NOTE — Assessment & Plan Note (Signed)
Bipolar disorder improved on Latuda

## 2020-04-01 NOTE — Telephone Encounter (Signed)
At request of Dr Joya Gaskins, met with the patient when she was in the clinic today.  She explained that she is currently living in a motel with her 3 children ranging in age from 23-47 yo.  She also works for [employer] and she has a car and drives. She is well aware of the food resources in the community - food pantries as well as free meals that are offered throughout [place]. Getting to those meals in the evening is not an option due to her evening work schedule.  She said that she is well connected with her church and her children's schools.  She noted that she has $2000 in food stamps. Her problem regarding food is that her refrigerator is a small dorm style refrigerator so she has no room for storage of essential foods or prepared meals. She has applied for section 8 housing and is in hopes that it will soon be approved and she move into an apartment.   She is requesting a scale as she noted that she should be logging her weights daily. Dr Joya Gaskins was notified.  She has an appointment with the heart failure clinic 04/28/2020.  She now has medicaid.   No other needs reported at this time

## 2020-04-01 NOTE — Assessment & Plan Note (Signed)
Ongoing vitamin D replacement

## 2020-04-01 NOTE — Assessment & Plan Note (Signed)
Patient is currently not smoking

## 2020-04-01 NOTE — Assessment & Plan Note (Addendum)
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

## 2020-04-01 NOTE — Patient Instructions (Addendum)
Please obtain a dental exam appointment  No change in insulin doses  Hold lasix for now  [facility] to stop magnesium  Call the Gastroenterology office to follow up on your appointment with Dr Paulita Fujita  Our case manager connected with you on meals/housing  Please obtain a covid vaccine. See below  COVID-19 Vaccine Information can be found at: [email or web address] For questions related to vaccine distribution or appointments, please email [email or web address] or call [phone].   Return Dr Joya Gaskins 6 weeks Video visit

## 2020-04-01 NOTE — Addendum Note (Signed)
Addended by: Asencion Noble E on: 04/01/2020 03:09 PM   Modules accepted: Orders

## 2020-04-01 NOTE — Assessment & Plan Note (Signed)
Fractured molars in the right upper jaw with dental caries  Referral to dental clinic made

## 2020-04-01 NOTE — Assessment & Plan Note (Signed)
Type 2 diabetes with improved control state we will follow-up A1c in June and continue long-acting insulin and NovoLog as prescribed

## 2020-04-01 NOTE — Progress Notes (Signed)
Subjective:    Patient ID: Sarah Long, female    DOB: 1971/10/14, 49 y.o.   MRN: [id number]  This is a 49 year old female history of type 2 diabetes and bipolar disorder.  Patient was last seen in June and was found to have hypertension not well controlled, a holosystolic murmur which was found to be physiologic with normal echocardiogram, type 2 diabetes poorly controlled, bipolar disorder, homelessness, and ongoing tobacco use.  The patient now is moved back to the homeless shelter away from the hotel she was housed in over the pandemic.  Note today on arrival blood sugar was greater than 500 she states she has not taken her insulin recently.  The patient has been compliant with her blood pressure medications however is still smoking a pack a day of cigarettes.  Patient has been compliant with her [name] that she receives from family services [facility] the [place].  The patient does have bunions in the feet but is yet to have her appointment with podiatry  10/02/2019 Patient is seen in follow-up from an August visit for poorly controlled type 2 diabetes, tobacco use, hyperlipidemia.  The patient lives in a shelter at [employer] and does work Building surveyor.  The patient often forgets to take her blood pressure medicine but she is taking her insulin daily.  Unfortunate her diet has been very noncompliant.  She eats lots of fast food and drinks sodas that contain sugar and sugar Gatorade.  She apparently had a very carbohydrate filled lunch along with sugar Sprite and sugar Gatorade and comes in with a CBG of 501 at this visit.  The patient is maintaining Lantus 80 units daily note she is already had her flu vaccine  She has improvement in her anxiety and depression.  She does complain of significant menstrual cramps that come on with her periods  The patient has tried ibuprofen 6 mg dose without relief.  03/11/2020 Has been in hosp since last ov The pt was admitted severely ill with  pancreatitis, AKI, multiple electrolyte disorders, DKA, complete heart block and evidence of heart failure without evidence of coronary artery disease on cardiac catheterization.  Below is the discharge summary  Dc summary:  Admit date: 02/09/2020 Discharge date: 02/26/2020 Consultations: Cardiology, [facility] Admitted From: home Disposition: home  Discharge Diagnoses:  Principal Problem:   Complete heart block ([place]) Active Problems:   Acute pancreatitis without infection or necrosis   Diabetic ketoacidosis without coma associated with type 2 diabetes mellitus (HCC)   Elevated troponin   NSTEMI (non-ST elevated myocardial infarction) (HCC)   Lactic acid acidosis   Endotracheally intubated   ACS (acute coronary syndrome) (HCC)   Prolonged QT interval   Acute on chronic combined systolic and diastolic CHF (congestive heart failure) [facility])   Hospital Course Summary: 49 yr old obese F w/ significant PMHx alcohol abuse, tobacco abuse, IDDM, HTN, HLD and morbid obesity presented to [facility] with 10/10 chest pain. Per the triage note pt reported vomiting all day, being short of breath and having severe chest pain feelingnear syncopal, although shedenied loss of consciousness oractualsyncopal episode.  ED Course: Afebrile, BP133/73mHg HR 41 RR 26 O2 sat 94% . CBG>6034mdl.BiBrandt.SalmAG 30. Na 131. K 5.3. Cr 2.85. Lipase 1879 AST 582. ALT 294. EKG showed an Idioventricular rhythm with LAD, AVB, LVH and an inferior infarct (QRS 162 ms and QT/QTc 574/467 ms). Troponin 730>>2000.Chest x-ray showed mild interstitial prominence suggesting pulmonary edema, low lung volumes. Patient went into complete AVB with  HR of 39 while in EDrequiringexternal pacer. Done she becomes somnolent after IV Versed requiring intubation.Hospital course:Cardiology consulted and she was admitted to ICU. She was also started on Unasyn for possible aspiration pneumonia.Patient underwent LHC on 3/7 which was  negative for CAD. She had temporary venous pacemaker advanced at the same time. Echo with EF of 40 to 45%, global hypokinesis, severe LVH and indeterminate diastolic function but no other significant finding. Patient was extubated on 02/11/2020.She eventually had permanent PPM/ICD implanton 02/13/2020.Patient also received evaluation/treatment foracute pancreatitis,transaminitis,rhabdomyolysis, significant hypocalcemia/vitamin D deficiency and iron deficiency anemiawhile in ICU.Patient transferred to [facility] service on 3/11. Cardiac issues been stable. Patient however has continued to complain of postprandial abdominal bloating, frequent diarrhea associated with worsening leukocytosis on lab work. Repeat CT on 3/16 showed persistent/possible worsening of pancreatitis and started on Rocephin/Flagyl. GI consulted and leukocytosis has been downtrending on antibiotics. She has also had generalized edemaduring the hospital course with echo findings as above.  Acute pancreatitis with abdominal pain and watery diarrhea-Leukocytosis got worse on 3/16 with persistent diarrhea , stool study negative for cdiff, stool PCR panel negative , stool culturesnegative for E. Coli/Campylobacter/Shigella/Salmonellaetc.Repeat CT scan done on 3/16 showed worsening of pancreatitis-started on Cipro and Flagyl, seen by [facility], Dr. Paulita Fujita and startedonpancrelipase supplement. She completed 5 days antibiotic while here.WBC count andlipase trending down,diet advanced to soft and regular diet which she has been tolerating well >48 hrs.Cleared by GI to discharge on current dose of probiotic/pancreatic enzymes. Okay to use as needed loperamide but patient no longer has diarrhea and dicontinued this medication in concern for prolonged qtc. She was receiving IV fentanyl for abdominal pain during the hospitals course. Changed to oral meds today. Reports cramping pain when having BM or on eating, will discharge on Bentyl prn.  Follow-up [facility], Dr Paulita Fujita, in few weeks.  Anasarca:Patient noted to be clinically edematouswithfluid overloadduring the hospital course. She hadnet positive 18.7 L since admission perI's and O'sdocumentation.S/p 1 dose of Lasix on 3/17, x1 on 3/18, x1 on 3/19.Improving ,encourage ambulation.Nutrition supplement.Discontinued  sodium bicarb tablets that she has been recieveing to avoid salt/fluid retention.Started onoral lasix yesterday which she is tolerating well with stable creatinine.  Upper extremity edema is almost resolved but still has lower extremity edema.  Recommended to take Lasix 20 mg daily for rest of the week then change to as needed use.  Advised to take extra potassium with Lasix to avoid hypokalemia in the setting of prolonged QTC.    Non-STEMI,Complete Heart Block/bifascicular block (RBBB and LAFB) :Patientpresented with severe chest painand noted to be in complete heart block.   Seen by cardiology and underwent cardiac cath with no acute blockages. She is now s/p PPM and ICD on 02/13/2020. Device interrogation normal.-Cardiologyfollowed up on patient 3/22, Steri-Strips removed. Wound healing well with no signs of redness, discharge or edema. Cardiology cleared for discharge and stated okay to drive. Okay for bath/shower but advised no lotions, creams or ointments or powders on device site.  Acutesystolic CHFwith acute hypoxic respiratory failure:Present on admission with chest x-ray suggestive of cardiomegaly/pulmonary edema. Echo on 3/7with LVEF of 40 to 45%, global hypokinesis, severe LVH.Required intubation in the ED. Patient received significant amount of IV fluids for pancreatitis/rhabdomyolysis initially and then received IV diuretics, now saturating well on room air. DC sodium bicarb tablets to avoid salt/fluid retention.  Discharged on low-dose oral Lasix with potassium as discussed above.  To follow-up PCP, cardiology clinic in a  week for further fluid/electrolyte balance assessment and  titration of diuretics.  Not on ACE inhibitors due to AKI and hypotension.  Uncontrolled insulin dependent DM-2 withDKA: Present on admission. Patient was struggling to get insulin supplies prior to admission. Presented with DKA that has resolved. A1c 12.7%. Hospital course complicated byhypoglycemicas well ashyperglycemicepisodes.C-peptide and insulin level within normal range.Currently on Lantus 22 units twice a day,meal coverage and sliding scaleswith blood glucose well controlled, less than 200. Seen by diabetes educator and follow-up with [place] and wellness advised.  Medications ordered through wellness clinic.  AKI-multifactorial including cardiorenal, rhabdoand iatrogenic from ARB/HCTZ and NSAIDs Now resolved. Avoid nephrotoxins. DC sodium bicarb tablets as metabolic acidosis now resolved. Monitor with periodic labs as outpatient while on oral lasix.  Hypokalemia/hypomagnesemia/hypophosphatemia-replaced as needed throughout the hospital course. Currently on scheduled potassium 49mq BID but potassium still only at 3.5, now also started on oral lasix.  Will continue potassium 40 mEq supplements upon discharge and advised additional potassium supplementation while taking Lasix.  Hopefully potassium level will come up once magnesium level improves and since diarrhea resolved.  Hypocalcemia/vitamin D deficiency: Calcium 4.8. Multifactorial including vitamin D deficiency (<4.2), rhabdo and acute pancreatitis. PTH is appropriately high. Urine calcium appropriately low at 4.3.-s/p Calcium gluconate 3 g. Continue P.o. vitamin D 50,000 IU weekly and 2000 daily-suspect poor observation in the setting of acute pancreatitis.Calciumlevel nowimproving, today isat 8.0,with lastalbuminlevel at1.5on 3/19.  QTc prolongation-monitoredon telemetryduring the hospital course, did have arrhythmias/electrolyte  abnormalities as explained above, keep mag close to 2(currently at 1.8),potassiumclose to4(currently at 3.5).  Patient received 1 g of IV magnesium yesterday and 2 g of IV magnesium today.  Will discharge on oral magnesium supplements.Avoid QT prolonging agents,QTCnowdown from 619to 526 now (also EKG showing paced rhythm so possibly atrial QTC slightly lower).  Rhabdomyolysis: Due to alcohol? Statin? CK [phone] CK-MB low.Received IV fluids during the hospital course. Statins held.CK normalizednow.  Recommend repeat lipid profile as outpatient.  Alcohol use disorder:Reportedly drinks about 5-6 drinks including liquors.Counseledto quit. -ConsultedTOC for resources.  Multivitamin/thiamine  Tobacco use disorder: Smokes about a pack a day.-Cessation counseling.Nicotine patchwhile here  Obesity:Body mass index is 37.63 kg/m.Counseled regarding risks and diet modifications.  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  She states her children were helping her find a place when she got sick and got admitted to the hospital.  Patient met withsocial worker and outpatient referrals have been placed for medication assistance, home health and DME..  Patient already has a PCP Dr. WJoya Gaskinswith home she plans to follow-up in 5 to 7 days.  Since discharge the patient states she is somewhat improved.  She has less swelling and edema.  She feels she may actually be too dry.  She has been taking insulin as prescribed.  She is no longer drinking alcohol.  She remains in a motel situation.  She has difficulty with sleeping.  She is quit smoking tobacco products as well.  Note blood sugar at home was 203 and on arrival here it was 342  Patient currently denies chest pain does have dyspnea with exertion.  She needs follow-up  appointment scheduled for cardiology and gastroenterology.  She also is requesting a scale  Wt Readings from Last 3 Encounters: 03/11/20 : 189 lb 9.6 oz (86 kg) 02/25/20 : 221 lb 9.6 oz (100.5 kg) 10/02/19 : 209 lb 12.8 oz (95.2 kg)   04/01/2020 Since the last visit  the patient is improved.  She is eating better.  She complains of irritation in the mouth.  Her feet are peeling.  She is back at work and she works at [employer].  She has an upcoming cardiology appointment scheduled.  She is yet to hear from gastroenterology about her pancreatitis follow-up.  She now is on long-acting insulin 64 units daily and NovoLog 10 units before meals 3 times daily her fasting sugars now been in the 1 70-1 80 range.  She complains of a toothache in the right upper jaw along with right ear pain and wishes this to be evaluated she is on Latuda now for her bipolar disorder through [facility] the [place]  Note at the last visit her magnesium and phosphorus levels have returned to normal    Past Medical History:  Diagnosis Date  . Abnormal Pap smear 1998  . Abnormal vaginal bleeding 12/19/2011  . Bacterial infection   . Bipolar 1 disorder ([place])   . Blister of second toe of left foot 11/21/2016  . Candida vaginitis 07/2007  . Depression    recently added wellbutrin-has not taken yet for bipolar  . Diabetes in pregnancy   . Diabetes mellitus    nph 20U qam and qpm, regular with meals  . Diabetic ketoacidosis without coma associated with type 2 diabetes mellitus ([place])   . Fibroid   . Galactorrhea of right breast 2008  . H/O amenorrhea 07/2007  . H/O dizziness 10/14/11  . H/O dysmenorrhea 2010  . H/O menorrhagia 10/14/11  . H/O varicella   . Headache(784.0)   . Heavy vaginal bleeding due to contraceptive injection use 10/12/2011   Depo Provera  . Herpes   . HSV-2 infection 01/03/2009  . Hx: UTI (urinary tract infection) 2009  . Hypertension    on aldomet  . Increased BMI 2010  . Irregular  uterine bleeding 04/04/2012   Pt has mirena   . Obesity 10/14/11  . Oligomenorrhea 07/2007  . Pelvic pain in female   . Preterm labor   . Trichomonas   . Yeast infection      Family History  Problem Relation Age of Onset  . Hypertension Mother   . Diabetes Mother   . Heart disease Mother   . Hypertension Father   . Diabetes Father   . Heart disease Father   . Stroke Maternal Grandfather   . Other Neg Hx      Social History   Socioeconomic History  . Marital status: Married    Spouse name: Not on file  . Number of children: Not on file  . Years of education: Not on file  . Highest education level: Not on file  Occupational History  . Not on file  Tobacco Use  . Smoking status: Former Smoker    Packs/day: 0.50    Years: 20.00    Pack years: 10.00    Types: Cigarettes    Quit date: 02/10/2020    Years since quitting: 0.1  . Smokeless tobacco: Never Used  Substance and Sexual Activity  . Alcohol use: No  . Drug use: No  . Sexual activity: Yes    Birth control/protection: None  Other Topics Concern  . Not on file  Social History Narrative  . Not on file   Social Determinants of Health   Financial Resource Strain:   . Difficulty of Paying Living Expenses:   Food Insecurity:   . Worried About Charity fundraiser in the Last  Year:   . Ran Out of Food in the Last Year:   Transportation Needs:   . Film/video editor (Medical):   [name] Lack of Transportation (Non-Medical):   Physical Activity:   . Days of Exercise per Week:   . Minutes of Exercise per Session:   Stress:   . Feeling of Stress :   Social Connections:   . Frequency of Communication with Friends and Family:   . Frequency of Social Gatherings with Friends and Family:   . Attends Religious Services:   . Active Member of Clubs or Organizations:   . Attends Archivist Meetings:   [name] Marital Status:   Intimate Partner Violence:   . Fear of Current or Ex-Partner:   . Emotionally Abused:   [name]  Physically Abused:   . Sexually Abused:      Allergies  Allergen Reactions  . Strawberry Extract Hives  . Sulfa Antibiotics Hives and Itching     Outpatient Medications Prior to Visit  Medication Sig Dispense Refill  . Accu-Chek Softclix Lancets lancets Use as instructed to check blood sugar three times daily. E11.65 100 each 6  . blood glucose meter kit and supplies KIT Dispense based on patient and insurance preference. Use up to four times daily as directed. (FOR ICD-9 250.00, 250.01). 1 each 0  . Blood Glucose Monitoring Suppl (ACCU-CHEK GUIDE ME) w/Device KIT 1 kit by Does not apply route in the morning, at noon, and at bedtime. Use as instructed to check blood sugar three times daily. H68.61 1 kit 0  . folic acid (FOLVITE) 1 MG tablet Take 1 tablet (1 mg total) by mouth daily. 30 tablet 1  . furosemide (LASIX) 20 MG tablet HOLD FOR NOW 30 tablet 1  . glucose blood (ACCU-CHEK GUIDE) test strip Use as instructed to check blood sugar three times daily. E11.65 100 each 6  . insulin aspart (NOVOLOG FLEXPEN) 100 UNIT/ML FlexPen Sliding scale three times daily: 0-150: 0 151-200: 2 units  201-250: 4 units  251-300: 6 units  301-350: 8 units  351- 400 10 units   >400  12 units (Patient taking differently: 10 unit with meals Sliding scale three times daily: 0-150: 0 151-200: 2 units  201-250: 4 units  251-300: 6 units  301-350: 8 units  351- 400 10 units   >400  12 units) 15 mL 11  . Insulin Glargine (BASAGLAR KWIKPEN) 100 UNIT/ML Inject 0.44 mLs (44 Units total) into the skin daily. (Patient taking differently: Inject 64 Units into the skin daily. ) 15 mL 2  . Insulin Pen Needle (B-D ULTRAFINE III SHORT PEN) 31G X 8 MM MISC 1 application by Does not apply route daily. 90 each 3  . Insulin Pen Needle (PEN NEEDLES) 31G X 8 MM MISC Use as directed 100 each 1  . Lancet Devices (ADJUSTABLE LANCING DEVICE) MISC Inject 1 each into the skin 3 (three) times daily. 100 each 5  . lipase/protease/amylase  24000-76000 units CPEP Take 1 capsule (24,000 Units total) by mouth 3 (three) times daily with meals. 270 capsule 1  . Misc. Devices (DIGITAL GLASS SCALE) MISC Weigh daily 1 each 0  . Vitamin D, Ergocalciferol, (DRISDOL) 1.25 MG (50000 UNIT) CAPS capsule Take 1 capsule (50,000 Units total) by mouth every 7 (seven) days. 5 capsule 1  . magnesium oxide (MAG-OX) 400 (241.3 Mg) MG tablet Take 1 tablet (400 mg total) by mouth daily. 30 tablet 0  . saccharomyces boulardii (FLORASTOR) 250 MG capsule Take  1 capsule (250 mg total) by mouth 2 (two) times daily. 30 capsule 1  . dicyclomine (BENTYL) 10 MG capsule Take 1 capsule (10 mg total) by mouth 3 (three) times daily before meals for 7 days. 21 capsule 0  . famotidine (PEPCID) 20 MG tablet Take 1 tablet (20 mg total) by mouth 2 (two) times daily. (Patient not taking: Reported on 03/11/2020) 60 tablet 1  . ferrous sulfate 325 (65 FE) MG tablet Take 1 tablet (325 mg total) by mouth 2 (two) times daily with a meal. (Patient not taking: Reported on 04/01/2020) 30 tablet 3  . potassium chloride SA (KLOR-CON) 20 MEQ tablet Take 2 tablets (40 mEq total) by mouth daily. (Patient not taking: Reported on 04/01/2020) 60 tablet 1   No facility-administered medications prior to visit.      Review of Systems  Constitutional: Negative for appetite change, fatigue, fever and unexpected weight change.  HENT: Negative.   Eyes: Negative for visual disturbance.  Respiratory: Negative for apnea, cough, choking, chest tightness, shortness of breath, wheezing and stridor.   Cardiovascular: Negative for chest pain, palpitations and leg swelling.  Gastrointestinal: Negative for diarrhea, nausea and vomiting.  Endocrine: Negative for polydipsia and polyuria.  Genitourinary: Negative.   Skin: Negative.   Neurological: Negative.   Hematological: Negative.   Psychiatric/Behavioral: Positive for dysphoric mood. Negative for self-injury, sleep disturbance and suicidal ideas. The  patient is not nervous/anxious.        Objective:   Physical Exam  Vitals:   04/01/20 1032  BP: (!) 148/84  Pulse: 95  Temp: 97.7 F (36.5 C)  TempSrc: Temporal  SpO2: 100%  Weight: 192 lb 12.8 oz (87.5 kg)  Height: 5' 4" (1.626 m)    Gen: Pleasant, well-nourished, in no distress, anxious affect  ENT: No lesions,  mouth clear,  oropharynx clear, no postnasal drip  Neck: No JVD, no TMG, no carotid bruits  Lungs: No use of accessory muscles, no dullness to percussion, clear without rales or rhonchi  Cardiovascular: paced rhythm, heart sounds normal, no murmur   +S3gallops, no peripheral edema  Abdomen: soft mildly tender epigastric area, no HSM,  BS normal  Musculoskeletal: No deformities, no cyanosis or clubbing  Neuro: alert, non focal  Skin: Warm, no lesions or rashes    BMP Latest Ref Rng & Units 03/11/2020 02/26/2020 02/25/2020  Glucose 65 - 99 mg/dL 359(H) 120(H) 202(H)  BUN 6 - 24 mg/dL 8 5(L) 6  Creatinine 0.57 - 1.00 mg/dL 0.87 0.76 0.80  BUN/Creat Ratio 9 - 23 9 - -  Sodium 134 - 144 mmol/L 138 137 137  Potassium 3.5 - 5.2 mmol/L 4.0 3.5 3.5  Chloride 96 - 106 mmol/L 98 104 105  CO2 20 - 29 mmol/L _0 Calcium 8.7 - 10.2 mg/dL 9.2 8.0(L) 8.0(L)   Hepatic Function Latest Ref Rng & Units 03/11/2020 02/22/2020 02/21/2020  Total Protein 6.0 - 8.5 g/dL 7.8 5.9(L) 6.1(L)  Albumin 3.8 - 4.8 g/dL 3.8 1.5(L) 1.5(L)  AST 0 - 40 IU/L 63(H) 23 22  ALT 0 - 32 IU/L _1 Alk Phosphatase 39 - 117 IU/L 174(H) 122 112  Total Bilirubin 0.0 - 1.2 mg/dL 0.2 0.6 0.5  Bilirubin, Direct 0.0 - 0.2 mg/dL - - -   CBC Latest Ref Rng & Units 03/11/2020 02/26/2020 02/25/2020  WBC 3.4 - 10.8 x10E3/uL 6.6 10.8(H) 12.8(H)  Hemoglobin 11.1 - 15.9 g/dL 11.5 8.0(L) 8.5(L)  Hematocrit 34.0 - 46.6 % 36.5 25.6(L)  26.9(L)  Platelets 150 - 450 x10E3/uL 307 344 328   Lipase     Component Value Date/Time   LIPASE 420 (H) 03/11/2020 1146        Assessment & Plan:  I personally  reviewed all images and lab data in the [facility] system as well as any outside material available during this office visit and agree with the  radiology impressions.   Chronic combined systolic and diastolic CHF (congestive heart failure) (HCC) Combined systolic and diastolic heart failure now fully paced with complete heart block  Plan is to get the patient into the advanced heart failure clinic for follow-up and follow-up with electrophysiology  We will hold Lasix and potassium for now discontinue magnesium continue all other cardiac therapies as prescribed  Acute pancreatitis without infection or necrosis History of acute pancreatitis secondary to alcohol use  Continue pancreatic enzyme replacement and follow-up with gastroenterology  Cracked tooth Fractured molars in the right upper jaw with dental caries  Referral to dental clinic made  Diabetes type 2, uncontrolled ([place]) Type 2 diabetes with improved control state we will follow-up A1c in June and continue long-acting insulin and NovoLog as prescribed  Bipolar 1 disorder ([place]) Bipolar disorder improved on Latuda  Vitamin D deficiency Ongoing vitamin D replacement  Smoking Patient is currently not smoking   Diagnoses and all orders for this visit:  Uncontrolled type 2 diabetes mellitus with hyperglycemia ([place])  Vitamin D deficiency  Prolonged QT interval  Acute on chronic combined systolic and diastolic CHF (congestive heart failure) (HCC)  Bipolar 1 disorder (HCC)  Smoking  HYPERTENSION, BENIGN SYSTEMIC  Dental caries  Cracked tooth  Alcohol-induced acute pancreatitis without infection or necrosis  Other orders -     Mouthwashes (BIOTENE/CALCIUM PBF) LIQD; Use as mouth rinse twice daily

## 2020-04-01 NOTE — Assessment & Plan Note (Signed)
History of acute pancreatitis secondary to alcohol use  Continue pancreatic enzyme replacement and follow-up with gastroenterology

## 2020-04-09 ENCOUNTER — Encounter: Payer: Self-pay | Admitting: Pharmacist

## 2020-04-09 ENCOUNTER — Other Ambulatory Visit: Payer: Self-pay

## 2020-04-09 ENCOUNTER — Ambulatory Visit: Payer: Medicaid Other | Attending: Critical Care Medicine | Admitting: Pharmacist

## 2020-04-09 DIAGNOSIS — E118 Type 2 diabetes mellitus with unspecified complications: Secondary | ICD-10-CM | POA: Diagnosis not present

## 2020-04-09 DIAGNOSIS — F17210 Nicotine dependence, cigarettes, uncomplicated: Secondary | ICD-10-CM | POA: Insufficient documentation

## 2020-04-09 DIAGNOSIS — Z833 Family history of diabetes mellitus: Secondary | ICD-10-CM | POA: Insufficient documentation

## 2020-04-09 DIAGNOSIS — E1165 Type 2 diabetes mellitus with hyperglycemia: Secondary | ICD-10-CM

## 2020-04-09 DIAGNOSIS — Z794 Long term (current) use of insulin: Secondary | ICD-10-CM | POA: Insufficient documentation

## 2020-04-09 DIAGNOSIS — E119 Type 2 diabetes mellitus without complications: Secondary | ICD-10-CM | POA: Diagnosis present

## 2020-04-09 NOTE — Progress Notes (Signed)
     S:    PCP: Dr. Joya Gaskins  No chief complaint on file.  Patient arrives in good spirits. Presents for diabetes evaluation, education, and management. Patient was referred and last seen by PCP on 03/11/20.  I saw her on 03/25/2020 and gave instructions to self-titrate insulin.   Family/Social History:  - FHx: DM (mother, father); HD (mother, father); HTN (mother, father) - Tobacco: current 0.5 PPD smoker - Alcohol: denies current use   Insurance coverage/medication affordability: [place] Medicaid  Patient reports adherence with medications.  Current diabetes medications include: Basaglar 66 units daily, Novolog 12 units BID  Patient denies hypoglycemic events.  Patient reported dietary habits:  - Pt w/ hx of homelessness; now living in a hotel and eats what she can - She states that she has difficulty eating regularly.   - She receives mostly processed foods including boiled eggs, orange juice, bacon, and sandwich meats   Patient-reported exercise habits:  - Limited   Patient denies polyuria. Reports some polydipsia.  Patient reports neuropathy. Patient reports visual changes. Patient reports self foot exams.     O:  POCT glucose: 80 (fasting)  Home glucose levels: reports 110s - 170s. 1 outlier in the 300s that she attributes to what she ate.   Lab Results  Component Value Date   HGBA1C 12.7 (H) 02/11/2020   There were no vitals filed for this visit.  Lipid Panel     Component Value Date/Time   CHOL 53 02/23/2020 0309   CHOL 120 03/17/2017 0000   TRIG 84 02/23/2020 0309   HDL 19 (L) 02/23/2020 0309   HDL 54 03/17/2017 0000   CHOLHDL 2.8 02/23/2020 0309   VLDL 17 02/23/2020 0309   LDLCALC 17 02/23/2020 0309   LDLCALC 30 03/17/2017 0000   Clinical ASCVD: No  The ASCVD Risk score Mikey Bussing DC Jr., et al., 2013) failed to calculate for the following reasons:   The patient has a prior MI or stroke diagnosis    A/P: Diabetes longstanding currently uncontrolled. Home  sugars reveal drastic improvement. Patient is able to verbalize appropriate hypoglycemia management plan. Patient is adherent with medication and has titrated basal insulin to 66 units daily. -Continue Basaglar 66 units daily. Continue to titrate by 2 units q3days until fasting CBGs are at goal (if needed). Max 70 units.  -Schedule Novolog to 10 units before meals.  -Extensively discussed pathophysiology of DM, recommended lifestyle interventions, dietary effects on glycemic control -Counseled on s/sx of and management of hypoglycemia -Next A1C anticipated 05/2020.   Written patient instructions provided. Total time in face to face counseling 30 minutes.   Follow up in 1 month for A1c check.  Benard Halsted, PharmD, Killian Hollandale, PharmD, [place] [phone]

## 2020-04-28 ENCOUNTER — Other Ambulatory Visit: Payer: Self-pay

## 2020-04-28 ENCOUNTER — Encounter (HOSPITAL_COMMUNITY): Payer: Self-pay | Admitting: Cardiology

## 2020-04-28 ENCOUNTER — Ambulatory Visit (HOSPITAL_COMMUNITY)
Admission: RE | Admit: 2020-04-28 | Discharge: 2020-04-28 | Disposition: A | Discharge status: Home / Self Care | Payer: Medicaid Other | Source: Ambulatory Visit | Attending: Cardiology | Admitting: Cardiology

## 2020-04-28 VITALS — BP 150/92 | HR 89 | Wt 193.0 lb

## 2020-04-28 DIAGNOSIS — I110 Hypertensive heart disease with heart failure: Secondary | ICD-10-CM | POA: Insufficient documentation

## 2020-04-28 DIAGNOSIS — E785 Hyperlipidemia, unspecified: Secondary | ICD-10-CM | POA: Diagnosis not present

## 2020-04-28 DIAGNOSIS — I5022 Chronic systolic (congestive) heart failure: Secondary | ICD-10-CM | POA: Diagnosis not present

## 2020-04-28 DIAGNOSIS — Z8249 Family history of ischemic heart disease and other diseases of the circulatory system: Secondary | ICD-10-CM | POA: Insufficient documentation

## 2020-04-28 DIAGNOSIS — I428 Other cardiomyopathies: Secondary | ICD-10-CM | POA: Insufficient documentation

## 2020-04-28 DIAGNOSIS — E119 Type 2 diabetes mellitus without complications: Secondary | ICD-10-CM | POA: Diagnosis not present

## 2020-04-28 DIAGNOSIS — I442 Atrioventricular block, complete: Secondary | ICD-10-CM | POA: Insufficient documentation

## 2020-04-28 DIAGNOSIS — D869 Sarcoidosis, unspecified: Secondary | ICD-10-CM | POA: Diagnosis not present

## 2020-04-28 DIAGNOSIS — Z950 Presence of cardiac pacemaker: Secondary | ICD-10-CM | POA: Insufficient documentation

## 2020-04-28 DIAGNOSIS — Z79899 Other long term (current) drug therapy: Secondary | ICD-10-CM | POA: Insufficient documentation

## 2020-04-28 DIAGNOSIS — F17210 Nicotine dependence, cigarettes, uncomplicated: Secondary | ICD-10-CM | POA: Diagnosis not present

## 2020-04-28 DIAGNOSIS — I5042 Chronic combined systolic (congestive) and diastolic (congestive) heart failure: Secondary | ICD-10-CM

## 2020-04-28 DIAGNOSIS — Z794 Long term (current) use of insulin: Secondary | ICD-10-CM | POA: Insufficient documentation

## 2020-04-28 LAB — BASIC METABOLIC PANEL
Anion gap: 11 (ref 5–15)
BUN: 15 mg/dL (ref 6–20)
CO2: 20 mmol/L — ABNORMAL LOW (ref 22–32)
Calcium: 8.9 mg/dL (ref 8.9–10.3)
Chloride: 107 mmol/L (ref 98–111)
Creatinine, Ser: 0.86 mg/dL (ref 0.44–1.00)
GFR calc Af Amer: >60 mL/min (ref >60)
GFR calc non Af Amer: >60 mL/min (ref >60)
Glucose, Bld: 145 mg/dL — ABNORMAL HIGH (ref 70–99)
Potassium: 3.9 mmol/L (ref 3.5–5.1)
Sodium: 138 mmol/L (ref 135–145)

## 2020-04-28 LAB — LIPID PANEL
Cholesterol: 186 mg/dL (ref 0–200)
HDL: 53 mg/dL (ref >40)
LDL Cholesterol: 108 mg/dL — ABNORMAL HIGH (ref 0–99)
Total CHOL/HDL Ratio: 3.5 RATIO
Triglycerides: 123 mg/dL (ref <150)
VLDL: 25 mg/dL (ref 0–40)

## 2020-04-28 MED ORDER — SACUBITRIL-VALSARTAN 24-26 MG PO TABS: 1.0000 | ORAL_TABLET | Freq: Two times a day (BID) | ORAL | 5 refills | Status: DC

## 2020-04-28 MED FILL — ENTRESTO 24 MG-26 MG TABLET: 24-26 | 30 days supply | Qty: 60 | Fill #0

## 2020-04-28 NOTE — Patient Instructions (Addendum)
START Entresto 24/26mg  (1 tab) twice a day  You have been ordered for a CT scan of your chest. You will get a call to schedule this appointment once approved by the insurance.   You have been ordered a PYP Scan.  This is done in the Radiology Department of [facility].  When you come for this test please plan to be there 2-3 hours.  You will get a call to schedule this appointment once approved by the insurance.    Labs today and repeat in 10 days We will only contact you if something comes back abnormal or we need to make some changes. Otherwise no news is good news!  Your physician recommends that you schedule a follow-up appointment in: 3 weeks with the pharmacist and 6 weeks with the Dr Aundra Dubin.   Please call office at [phone] option [phone] if you have any questions or concerns.   At the [facility], you and your health needs are our priority. As part of our continuing mission to provide you with exceptional heart care, we have created designated Provider Care Teams. These Care Teams include your primary Cardiologist (physician) and Advanced Practice Providers (APPs- Physician Assistants and Nurse Practitioners) who all work together to provide you with the care you need, when you need it.   You may see any of the following providers on your designated Care Team at your next follow up: [name] Dr Glori Bickers . Dr Loralie Champagne . Darrick Grinder, NP . Lyda Jester, PA . Audry Riles, PharmD   Please be sure to bring in all your medications bottles to every appointment.

## 2020-04-28 NOTE — Progress Notes (Signed)
PCP: [name], MD Cardiology: Dr. Aundra Dubin  49 y.o. with history of type 2 diabetes, HTN, hyperlipidemia, complete heart block with PPM, and chronic diastolic CHF was referred by Dr. Joya Gaskins for evaluation of CHF.  Patient has had diabetes since age 66 and has had HTN for at least 5 years.  She is a smoker and has a history of prior ETOH abuse with ETOH pancreatitis. In 3/21, she was admitted with DKA.  She was found to have associated complete heart block that did not resolve, she had St Jude PPM placed.  She had an echocardiogram showing EF 40-45% with severe LV hypertrophy.  She did not have a cardiac MRI prior to PPM placement.  Cath in 3/21 showed no significant CAD (nonischemic cardiomyopathy).   She seems to be doing well currently.  She is not on any cardiac meds.  BP is elevated today.  She has been walking for exercise.  No dyspnea walking on flat ground, she can climb a flight of stairs without problems.  No orthopnea/PND.  She is working at the front desk in a hotel.   ECG: NSR, v-paced  St Jude device interrogation: >99% V-paced  Labs (4/21): K 4, creatinine 0.87  PMH: 1. Type 2 diabetes: Diagnosed at age 26.  2. HTN 3. Hyperlipidemia 4. Active smoker 5. H/o ETOH pancreatitis 6. Bipolar disorder 7. Complete heart block: 3/21, associate with DKA episode.  Has St Jude PPM.  8. Chronic systolic CHF: Nonischemic cardiomyopathy.  Echo (3/21) with EF 40-45%, severe LVH, mild MR, normal RV.  - LHC (3/21) normal coronaries.   SH: Married, works in a hotel at the front desk, prior heavy ETOH now rare, smokes 10-12 cigarettes/day.   FH: Grandfather with CHF. No known sarcoidosis or amyloidosis.   ROS: All systems reviewed and negative except as per HPI.   Current Outpatient Medications  Medication Sig Dispense Refill  . Accu-Chek Softclix Lancets lancets Use as instructed to check blood sugar three times daily. E11.65 100 each 6  . Blood Glucose Monitoring Suppl (ACCU-CHEK  GUIDE ME) w/Device KIT 1 kit by Does not apply route in the morning, at noon, and at bedtime. Use as instructed to check blood sugar three times daily. E11.65 1 kit 0  . glucose blood (ACCU-CHEK GUIDE) test strip Use as instructed to check blood sugar three times daily. E11.65 100 each 6  . insulin aspart (NOVOLOG FLEXPEN) 100 UNIT/ML FlexPen Sliding scale three times daily: 0-150: 0 151-200: 2 units  201-250: 4 units  251-300: 6 units  301-350: 8 units  351- 400 10 units   >400  12 units (Patient taking differently: 10 unit with meals Sliding scale three times daily: 0-150: 0 151-200: 2 units  201-250: 4 units  251-300: 6 units  301-350: 8 units  351- 400 10 units   >400  12 units) 15 mL 11  . Insulin Glargine (BASAGLAR KWIKPEN) 100 UNIT/ML Inject 0.44 mLs (44 Units total) into the skin daily. (Patient taking differently: Inject 64 Units into the skin daily. ) 15 mL 2  . Insulin Pen Needle (B-D ULTRAFINE III SHORT PEN) 31G X 8 MM MISC 1 application by Does not apply route daily. 90 each 3  . Insulin Pen Needle (PEN NEEDLES) 31G X 8 MM MISC Use as directed 100 each 1  . Lancet Devices (ADJUSTABLE LANCING DEVICE) MISC Inject 1 each into the skin 3 (three) times daily. 100 each 5  . LATUDA 40 MG TABS tablet SMARTSIG:1 Tablet(s)  By Mouth Every Evening    . lipase/protease/amylase 24000-76000 units CPEP Take 1 capsule (24,000 Units total) by mouth 3 (three) times daily with meals. 270 capsule 1  . dicyclomine (BENTYL) 10 MG capsule Take 1 capsule (10 mg total) by mouth 3 (three) times daily before meals for 7 days. 21 capsule 0  . sacubitril-valsartan (ENTRESTO) 24-26 MG Take 1 tablet by mouth 2 (two) times daily. 60 tablet 5   No current facility-administered medications for this encounter.   BP (!) 150/92   Pulse 89   Wt 87.5 kg (193 lb)   LMP 04/01/2020 (Exact Date)   SpO2 100%   BMI 33.13 kg/m  General: NAD Neck: No JVD, no thyromegaly or thyroid nodule.  Lungs: Clear to auscultation  bilaterally with normal respiratory effort. CV: Nondisplaced PMI.  Heart regular S1/S2, no S3/S4, no murmur.  No peripheral edema.  No carotid bruit.  Normal pedal pulses.  Abdomen: Soft, nontender, no hepatosplenomegaly, no distention.  Skin: Intact without lesions or rashes.  Neurologic: Alert and oriented x 3.  Psych: Normal affect. Extremities: No clubbing or cyanosis.  HEENT: Normal.   Assessment/Plan: 1. Chronic systolic CHF: Echo in 1/69 with EF 40-45%, severe LV hypertrophy.  This was found in association with complete heart block.  Nonischemic cardiomyopathy, cath in 3/21 showed no significant CAD.  Etiology still uncertain, may be due to long-standing HTN but association with CHB is concerning for cardiac sarcoidosis or cardiac amyloidosis also.  No known family history of either sarcoidosis or amyloidosis. Given her young age, if amyloidosis is present, it would likely be hereditary TTR.  On exam, she is not volume overloaded.  NYHA class I-II symptoms.  - Start Entresto 24/26 bid.  BMET today and again in 10 days.   - I will check myeloma panel, urine immunofixation, PYP scan.  - I will arrange for high resolution CT chest to assess for any sign of pulmonary sarcoidosis.  She will need ACE level done.   - Will need to see if her PPM is compatible with MRI, cardiac MRI would be helpful.  If unable to do MRI and concern for sarcoidosis remains, could consider referral out for cardiac PET.  2. HTN: As above, starting Entresto.    3. Hyperlipidemia: Check lipids today.  4. Active smoker: I strongly urged her to quit.  She will consider use of nicotine patches.  5. Type 2 diabetes: Followed by PCP.  6. Complete heart block: She has a St Jude PPM and is pacing her RV > 99% of the time.  As above, workup for cardiac sarcoidosis and cardiac amyloidosis as underlying cause.   Loralie Champagne 04/28/2020

## 2020-04-29 ENCOUNTER — Telehealth (HOSPITAL_COMMUNITY): Payer: Self-pay

## 2020-04-29 ENCOUNTER — Telehealth (HOSPITAL_COMMUNITY): Payer: Self-pay | Admitting: Pharmacy Technician

## 2020-04-29 MED ORDER — ROSUVASTATIN CALCIUM 10 MG PO TABS
10.0000 mg | ORAL_TABLET | Freq: Every day | ORAL | 3 refills | Status: DC
Start: 2020-04-29 — End: 2021-06-09

## 2020-04-29 MED FILL — ROSUVASTATIN CALCIUM 10 MG: 10 | 90 days supply | Qty: 90 | Fill #0

## 2020-04-29 MED FILL — LANTUS SOLOSTAR 100 UNITS/M: 100 | 34 days supply | Qty: 15 | Fill #1

## 2020-04-29 NOTE — Telephone Encounter (Signed)
Pt aware of results. Advised to start crestor 10mg . Will repeat labs at 6 week fu. Verbalized understanding

## 2020-04-29 NOTE — Telephone Encounter (Signed)
-----   Message from Larey Dresser, MD sent at 04/29/2020  9:23 AM EDT ----- LDL above goal given diabetes (ideally < 70).  Would start Crestor 10 mg daily.  Lipids/LFTs in 2 months.

## 2020-04-29 NOTE — Telephone Encounter (Signed)
Patient Advocate Encounter   Received notification from Medicaid that prior authorization for Delene Loll is required.   PA submitted on [employer] [id number] W Status is pending   Will continue to follow.

## 2020-04-30 LAB — MULTIPLE MYELOMA PANEL, SERUM
Albumin SerPl Elph-Mcnc: 3.3 g/dL (ref 2.9–4.4)
Albumin/Glob SerPl: 0.9 (ref 0.7–1.7)
Alpha 1: 0.2 g/dL (ref 0.0–0.4)
Alpha2 Glob SerPl Elph-Mcnc: 1 g/dL (ref 0.4–1.0)
B-Globulin SerPl Elph-Mcnc: 1.2 g/dL (ref 0.7–1.3)
Gamma Glob SerPl Elph-Mcnc: 1.5 g/dL (ref 0.4–1.8)
Globulin, Total: 4 g/dL — ABNORMAL HIGH (ref 2.2–3.9)
IgA: 331 mg/dL (ref 87–352)
IgG (Immunoglobin G), Serum: 1501 mg/dL (ref 586–1602)
IgM (Immunoglobulin M), Srm: 89 mg/dL (ref 26–217)
Total Protein ELP: 7.3 g/dL (ref 6.0–8.5)

## 2020-04-30 LAB — IMMUNOFIXATION, URINE

## 2020-05-06 ENCOUNTER — Telehealth (HOSPITAL_COMMUNITY): Payer: Self-pay | Admitting: Vascular Surgery

## 2020-05-06 NOTE — Telephone Encounter (Signed)
Left pt message giving PYP scan appt 6/8 @ noon, gave pt detailed instructions, asked pt to call back to confirm appt

## 2020-05-07 NOTE — Telephone Encounter (Signed)
Advanced Heart Failure Patient Advocate Encounter  Prior Authorization for Sarah Long has been approved.    PA# [id number] Effective dates: 04/29/20 through 04/24/21  Patients co-pay is $3.00  [name], CPhT

## 2020-05-08 ENCOUNTER — Other Ambulatory Visit: Payer: Self-pay

## 2020-05-08 ENCOUNTER — Ambulatory Visit (HOSPITAL_COMMUNITY)
Admission: RE | Admit: 2020-05-08 | Discharge: 2020-05-08 | Disposition: A | Discharge status: Home / Self Care | Payer: Medicaid Other | Source: Ambulatory Visit | Attending: Cardiology | Admitting: Cardiology

## 2020-05-08 DIAGNOSIS — I5042 Chronic combined systolic (congestive) and diastolic (congestive) heart failure: Secondary | ICD-10-CM | POA: Insufficient documentation

## 2020-05-08 LAB — BASIC METABOLIC PANEL
Anion gap: 9 (ref 5–15)
BUN: 17 mg/dL (ref 6–20)
CO2: 26 mmol/L (ref 22–32)
Calcium: 8.8 mg/dL — ABNORMAL LOW (ref 8.9–10.3)
Chloride: 103 mmol/L (ref 98–111)
Creatinine, Ser: 0.92 mg/dL (ref 0.44–1.00)
GFR calc Af Amer: >60 mL/min (ref >60)
GFR calc non Af Amer: >60 mL/min (ref >60)
Glucose, Bld: 200 mg/dL — ABNORMAL HIGH (ref 70–99)
Potassium: 3.9 mmol/L (ref 3.5–5.1)
Sodium: 138 mmol/L (ref 135–145)

## 2020-05-08 MED FILL — LATUDA 40 MG TABLET: 40 | 30 days supply | Qty: 30 | Fill #0

## 2020-05-08 MED FILL — ACCU-CHEK GUIDE TEST STRIP: 30 days supply | Qty: 100 | Fill #1

## 2020-05-12 ENCOUNTER — Ambulatory Visit: Payer: Medicaid Other | Admitting: Pharmacist

## 2020-05-13 ENCOUNTER — Encounter: Payer: Self-pay | Admitting: Critical Care Medicine

## 2020-05-13 ENCOUNTER — Encounter (HOSPITAL_COMMUNITY)
Admission: RE | Admit: 2020-05-13 | Discharge: 2020-05-13 | Disposition: A | Discharge status: Home / Self Care | Payer: Medicaid Other | Source: Ambulatory Visit | Attending: Cardiology | Admitting: Cardiology

## 2020-05-13 ENCOUNTER — Other Ambulatory Visit: Payer: Self-pay | Admitting: Pharmacist

## 2020-05-13 ENCOUNTER — Ambulatory Visit: Payer: Medicaid Other | Attending: Critical Care Medicine | Admitting: Critical Care Medicine

## 2020-05-13 ENCOUNTER — Other Ambulatory Visit: Payer: Self-pay

## 2020-05-13 DIAGNOSIS — I5042 Chronic combined systolic (congestive) and diastolic (congestive) heart failure: Secondary | ICD-10-CM | POA: Diagnosis present

## 2020-05-13 DIAGNOSIS — K852 Alcohol induced acute pancreatitis without necrosis or infection: Secondary | ICD-10-CM

## 2020-05-13 DIAGNOSIS — E1165 Type 2 diabetes mellitus with hyperglycemia: Secondary | ICD-10-CM | POA: Diagnosis not present

## 2020-05-13 DIAGNOSIS — E7800 Pure hypercholesterolemia, unspecified: Secondary | ICD-10-CM

## 2020-05-13 DIAGNOSIS — I10 Essential (primary) hypertension: Secondary | ICD-10-CM | POA: Diagnosis not present

## 2020-05-13 MED ORDER — THIAMINE HCL 100 MG PO TABS
100.0000 mg | ORAL_TABLET | Freq: Every day | ORAL | 1 refills | Status: DC
Start: 2020-05-13 — End: 2020-07-16

## 2020-05-13 MED ORDER — ONDANSETRON HCL 4 MG PO TABS: 4.0000 mg | ORAL_TABLET | Freq: Three times a day (TID) | ORAL | 0 refills | Status: DC | PRN

## 2020-05-13 MED ORDER — TECHNETIUM TC 99M PYROPHOSPHATE
20.5000 | Freq: Once | INTRAVENOUS | Status: AC | PRN
  Administered 2020-05-13: 20.5 via INTRAVENOUS
  Filled 2020-05-13: qty 21

## 2020-05-13 NOTE — Assessment & Plan Note (Signed)
Chronic pancreatitis will add thiamine therapy as the patient is still consuming alcohol and advised to reduce alcohol consumption to 0

## 2020-05-13 NOTE — Assessment & Plan Note (Signed)
We will continue Entresto at this time

## 2020-05-13 NOTE — Assessment & Plan Note (Signed)
Type 2 diabetes uncontrolled asked the patient to use her Basaglar in the morning if she cannot be compliant taking it at night

## 2020-05-13 NOTE — Assessment & Plan Note (Signed)
Continue Entresto for hypertension and heart failure

## 2020-05-13 NOTE — Assessment & Plan Note (Addendum)
Patient is advised to continue Rosuvastatin for now

## 2020-05-13 NOTE — Progress Notes (Addendum)
Subjective:    Patient ID: Sarah Long, female    DOB: 03/05/1971, 49 y.o.   MRN: [id number] Virtual Visit via Telephone Note  I connected with Sarah Long on 05/13/20 at 10:00 AM EDT by telephone and verified that I am speaking with the correct person using two identifiers.   Consent:  I discussed the limitations, risks, security and privacy concerns of performing an evaluation and management service by telephone and the availability of in person appointments. I also discussed with the patient that there may be a patient responsible charge related to this service. The patient expressed understanding and agreed to proceed.  Location of patient: pt at home  Location of provider: I am in my office  Persons participating in the televisit with the patient.   No one else on the call    History of Present Illness:  This is a 49 year old female history of type 2 diabetes and bipolar disorder.  Patient was last seen in June and was found to have hypertension not well controlled, a holosystolic murmur which was found to be physiologic with normal echocardiogram, type 2 diabetes poorly controlled, bipolar disorder, homelessness, and ongoing tobacco use.  The patient now is moved back to the homeless shelter away from the hotel she was housed in over the pandemic.  Note today on arrival blood sugar was greater than 500 she states she has not taken her insulin recently.  The patient has been compliant with her blood pressure medications however is still smoking a pack a day of cigarettes.  Patient has been compliant with her [name] that she receives from family services of the [place].  The patient does have bunions in the feet but is yet to have her appointment with podiatry  10/02/2019 Patient is seen in follow-up from an August visit for poorly controlled type 2 diabetes, tobacco use, hyperlipidemia.  The patient lives in a shelter at [employer] and does work Building surveyor.  The  patient often forgets to take her blood pressure medicine but she is taking her insulin daily.  Unfortunate her diet has been very noncompliant.  She eats lots of fast food and drinks sodas that contain sugar and sugar Gatorade.  She apparently had a very carbohydrate filled lunch along with sugar Sprite and sugar Gatorade and comes in with a CBG of 501 at this visit.  The patient is maintaining Lantus 80 units daily note she is already had her flu vaccine  She has improvement in her anxiety and depression.  She does complain of significant menstrual cramps that come on with her periods  The patient has tried ibuprofen 6 mg dose without relief.  03/11/2020 Has been in hosp since last ov The pt was admitted severely ill with pancreatitis, AKI, multiple electrolyte disorders, DKA, complete heart block and evidence of heart failure without evidence of coronary artery disease on cardiac catheterization.  Below is the discharge summary  Dc summary:  Admit date: 02/09/2020 Discharge date: 02/26/2020 Consultations: Cardiology, [facility] Admitted From: home Disposition: home  Discharge Diagnoses:  Principal Problem:   Complete heart block ([place]) Active Problems:   Acute pancreatitis without infection or necrosis   Diabetic ketoacidosis without coma associated with type 2 diabetes mellitus (HCC)   Elevated troponin   NSTEMI (non-ST elevated myocardial infarction) (HCC)   Lactic acid acidosis   Endotracheally intubated   ACS (acute coronary syndrome) (HCC)   Prolonged QT interval   Acute on chronic combined systolic and diastolic CHF (  congestive heart failure) [facility])   Hospital Course Summary: 49 yr old obese F w/ significant PMHx alcohol abuse, tobacco abuse, IDDM, HTN, HLD and morbid obesity presented to [facility] with 10/10 chest pain. Per the triage note pt reported vomiting all day, being short of breath and having severe chest pain feelingnear syncopal, although shedenied loss of  consciousness oractualsyncopal episode.  ED Course: Afebrile, BP133/61mHg HR 41 RR 26 O2 sat 94% . CBG>6092mdl.BiBrandt.SalmAG 30. Na 131. K 5.3. Cr 2.85. Lipase 1879 AST 582. ALT 294. EKG showed an Idioventricular rhythm with LAD, AVB, LVH and an inferior infarct (QRS 162 ms and QT/QTc 574/467 ms). Troponin 730>>2000.Chest x-ray showed mild interstitial prominence suggesting pulmonary edema, low lung volumes. Patient went into complete AVB with HR of 39 while in EDrequiringexternal pacer. Done she becomes somnolent after IV Versed requiring intubation.Hospital course:Cardiology consulted and she was admitted to ICU. She was also started on Unasyn for possible aspiration pneumonia.Patient underwent LHC on 3/7 which was negative for CAD. She had temporary venous pacemaker advanced at the same time. Echo with EF of 40 to 45%, global hypokinesis, severe LVH and indeterminate diastolic function but no other significant finding. Patient was extubated on 02/11/2020.She eventually had permanent PPM/ICD implanton 02/13/2020.Patient also received evaluation/treatment foracute pancreatitis,transaminitis,rhabdomyolysis, significant hypocalcemia/vitamin D deficiency and iron deficiency anemiawhile in ICU.[facility] Cardiac issues been stable. Patient however has continued to complain of postprandial abdominal bloating, frequent diarrhea associated with worsening leukocytosis on lab work. Repeat CT on 3/16 showed persistent/possible worsening of pancreatitis and started on Rocephin/Flagyl. GI consulted and leukocytosis has been downtrending on antibiotics. She has also had generalized edemaduring the hospital course with echo findings as above.  Acute pancreatitis with abdominal pain and watery diarrhea-Leukocytosis got worse on 3/16 with persistent diarrhea , stool study negative for cdiff, stool PCR panel negative , stool culturesnegative for E.  Coli/Campylobacter/Shigella/Salmonellaetc.Repeat CT scan done on 3/16 showed worsening of pancreatitis-started on Cipro and Flagyl, [facility] Dr. OuPaulita Fujitand startedonpancrelipase supplement. She completed 5 days antibiotic while here.WBC count andlipase trending down,diet advanced to soft and regular diet which she has been tolerating well >48 hrs.Cleared by GI to discharge on current dose of probiotic/pancreatic enzymes. Okay to use as needed loperamide but patient no longer has diarrhea and dicontinued this medication in concern for prolonged qtc. She was receiving IV fentanyl for abdominal pain during the hospitals course. Changed to oral meds today. Reports cramping pain when having BM or on eating, will discharge on Bentyl prn. Follow-up [facility], Dr OuPaulita Fujitain few weeks.  Anasarca:Patient noted to be clinically edematouswithfluid overloadduring the hospital course. She hadnet positive 18.7 L since admission perI's and O'sdocumentation.S/p 1 dose of Lasix on 3/17, x1 on 3/18, x1 on 3/19.Improving ,encourage ambulation.Nutrition supplement.Discontinued  sodium bicarb tablets that she has been recieveing to avoid salt/fluid retention.Started onoral lasix yesterday which she is tolerating well with stable creatinine.  Upper extremity edema is almost resolved but still has lower extremity edema.  Recommended to take Lasix 20 mg daily for rest of the week then change to as needed use.  Advised to take extra potassium with Lasix to avoid hypokalemia in the setting of prolonged QTC.    Non-STEMI,Complete Heart Block/bifascicular block (RBBB and LAFB) :Patientpresented with severe chest painand noted to be in complete heart block.   Seen by cardiology and underwent cardiac cath with no acute blockages. She is now s/p PPM and ICD on 02/13/2020. Device interrogation normal.-Cardiologyfollowed up  on patient 3/22, Steri-Strips removed. Wound healing well with no signs  of redness, discharge or edema. Cardiology cleared for discharge and stated okay to drive. Okay for bath/shower but advised no lotions, creams or ointments or powders on device site.  Acutesystolic CHFwith acute hypoxic respiratory failure:Present on admission with chest x-ray suggestive of cardiomegaly/pulmonary edema. Echo on 3/7with LVEF of 40 to 45%, global hypokinesis, severe LVH.Required intubation in the ED. Patient received significant amount of IV fluids for pancreatitis/rhabdomyolysis initially and then received IV diuretics, now saturating well on room air. DC sodium bicarb tablets to avoid salt/fluid retention.  Discharged on low-dose oral Lasix with potassium as discussed above.  To follow-up PCP, cardiology clinic in a week for further fluid/electrolyte balance assessment and titration of diuretics.  Not on ACE inhibitors due to AKI and hypotension.  Uncontrolled insulin dependent DM-2 withDKA: Present on admission. Patient was struggling to get insulin supplies prior to admission. Presented with DKA that has resolved. A1c 12.7%. Hospital course complicated byhypoglycemicas well ashyperglycemicepisodes.C-peptide and insulin level within normal range.Currently on Lantus 22 units twice a day,meal coverage and sliding scaleswith blood glucose well controlled, less than 200. Seen by diabetes educator and follow-up with [facility] and wellness advised.  Medications ordered through wellness clinic.  AKI-multifactorial including cardiorenal, rhabdoand iatrogenic from ARB/HCTZ and NSAIDs Now resolved. Avoid nephrotoxins. DC sodium bicarb tablets as metabolic acidosis now resolved. Monitor with periodic labs as outpatient while on oral lasix.  Hypokalemia/hypomagnesemia/hypophosphatemia-replaced as needed throughout the hospital course. Currently on scheduled potassium 59mq BID but potassium still only at 3.5, now also started on oral lasix.  Will continue potassium 40  mEq supplements upon discharge and advised additional potassium supplementation while taking Lasix.  Hopefully potassium level will come up once magnesium level improves and since diarrhea resolved.  Hypocalcemia/vitamin D deficiency: Calcium 4.8. Multifactorial including vitamin D deficiency (<4.2), rhabdo and acute pancreatitis. PTH is appropriately high. Urine calcium appropriately low at 4.3.-s/p Calcium gluconate 3 g. Continue P.o. vitamin D 50,000 IU weekly and 2000 daily-suspect poor observation in the setting of acute pancreatitis.Calciumlevel nowimproving, today isat 8.0,with lastalbuminlevel at1.5on 3/19.  QTc prolongation-monitoredon telemetryduring the hospital course, did have arrhythmias/electrolyte abnormalities as explained above, keep mag close to 2(currently at 1.8),potassiumclose to4(currently at 3.5).  Patient received 1 g of IV magnesium yesterday and 2 g of IV magnesium today.  Will discharge on oral magnesium supplements.Avoid QT prolonging agents,QTCnowdown from 619to 526 now (also EKG showing paced rhythm so possibly atrial QTC slightly lower).  Rhabdomyolysis: Due to alcohol? Statin? CK [phone] CK-MB low.Received IV fluids during the hospital course. Statins held.CK normalizednow.  Recommend repeat lipid profile as outpatient.  Alcohol use disorder:Reportedly drinks about 5-6 drinks including liquors.Counseledto quit. -ConsultedTOC for resources.  Multivitamin/thiamine  Tobacco use disorder: Smokes about a pack a day.-Cessation counseling.Nicotine patchwhile here  Obesity:Body mass index is 37.63 kg/m.Counseled regarding risks and diet modifications.  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  She states her children were helping her find a place when she  got sick and got admitted to the hospital.  Patient met withsocial worker and outpatient referrals have been placed for medication assistance, home health and DME..  Patient already has a PCP Dr. WJoya Gaskinswith home she plans to follow-up in 5 to 7 days.  Since discharge the patient states she is somewhat improved.  She has less swelling and  edema.  She feels she may actually be too dry.  She has been taking insulin as prescribed.  She is no longer drinking alcohol.  She remains in a motel situation.  She has difficulty with sleeping.  She is quit smoking tobacco products as well.  Note blood sugar at home was 203 and on arrival here it was 342  Patient currently denies chest pain does have dyspnea with exertion.  She needs follow-up appointment scheduled for cardiology and gastroenterology.  She also is requesting a scale  Wt Readings from Last 3 Encounters: 03/11/20 : 189 lb 9.6 oz (86 kg) 02/25/20 : 221 lb 9.6 oz (100.5 kg) 10/02/19 : 209 lb 12.8 oz (95.2 kg)   04/01/2020 Since the last visit the patient is improved.  She is eating better.  She complains of irritation in the mouth.  Her feet are peeling.  She is back at work and she works at [employer].  She has an upcoming cardiology appointment scheduled.  She is yet to hear from gastroenterology about her pancreatitis follow-up.  She now is on long-acting insulin 64 units daily and NovoLog 10 units before meals 3 times daily her fasting sugars now been in the 1 70-1 80 range.  She complains of a toothache in the right upper jaw along with right ear pain and wishes this to be evaluated she is on Latuda now for her bipolar disorder through [facility] the [place]  Note at the last visit her magnesium and phosphorus levels have returned to normal  05/13/2020 Patient seen in return follow-up with history of combined systolic diastolic heart failure and also pacemaker placement due to complete heart block.  Patient's been seen in cardiology  clinic and now is on the [facility] 24/26 1 daily is off furosemide at this time.  She states her dyspnea is improved.  Blood glucoses have improved as well in the 1 7190 range however is a bit higher than has been in the past and she does have nausea and some emesis.  The patient does have chronic pancreatitis and is staying on her pancreatic enzyme replacement therapy.  She states Zofran has helped nausea in the past.  She still living in a hotel environment.  She still working nights.  Sometimes she will skip her evening dose of Basaglar.  She is up to 70 units daily on the [place]   Past Medical History:  Diagnosis Date  . Abnormal Pap smear 1998  . Abnormal vaginal bleeding 12/19/2011  . Bacterial infection   . Bipolar 1 disorder ([place])   . Blister of second toe of left foot 11/21/2016  . Candida vaginitis 07/2007  . Depression    recently added wellbutrin-has not taken yet for bipolar  . Diabetes in pregnancy   . Diabetes mellitus    nph 20U qam and qpm, regular with meals  . Diabetic ketoacidosis without coma associated with type 2 diabetes mellitus ([place])   . Fibroid   . Galactorrhea of right breast 2008  . H/O amenorrhea 07/2007  . H/O dizziness 10/14/11  . H/O dysmenorrhea 2010  . H/O menorrhagia 10/14/11  . H/O varicella   . Headache(784.0)   . Heavy vaginal bleeding due to contraceptive injection use 10/12/2011   Depo Provera  . Herpes   . HSV-2 infection 01/03/2009  . Hx: UTI (urinary tract infection) 2009  . Hypertension    on aldomet  . Increased BMI 2010  . Irregular uterine bleeding 04/04/2012   Pt  has mirena   . Obesity 10/14/11  . Oligomenorrhea 07/2007  . Pelvic pain in female   . Preterm labor   . Trichomonas   . Yeast infection      Family History  Problem Relation Age of Onset  . Hypertension Mother   . Diabetes Mother   . Heart disease Mother   . Hypertension Father   . Diabetes Father   . Heart disease Father   . Stroke Maternal Grandfather   . Other  Neg Hx      Social History   Socioeconomic History  . Marital status: Married    Spouse name: Not on file  . Number of children: Not on file  . Years of education: Not on file  . Highest education level: Not on file  Occupational History  . Not on file  Tobacco Use  . Smoking status: Former Smoker    Packs/day: 0.50    Years: 20.00    Pack years: 10.00    Types: Cigarettes    Quit date: 02/10/2020    Years since quitting: 0.2  . Smokeless tobacco: Never Used  Substance and Sexual Activity  . Alcohol use: No  . Drug use: No  . Sexual activity: Yes    Birth control/protection: None  Other Topics Concern  . Not on file  Social History Narrative  . Not on file   Social Determinants of Health   Financial Resource Strain:   . Difficulty of Paying Living Expenses:   Food Insecurity:   . Worried About Charity fundraiser in the Last Year:   . Arboriculturist in the Last Year:   Transportation Needs:   . Film/video editor (Medical):   [name] Lack of Transportation (Non-Medical):   Physical Activity:   . Days of Exercise per Week:   . Minutes of Exercise per Session:   Stress:   . Feeling of Stress :   Social Connections:   . Frequency of Communication with Friends and Family:   . Frequency of Social Gatherings with Friends and Family:   . Attends Religious Services:   . Active Member of Clubs or Organizations:   . Attends Archivist Meetings:   [name] Marital Status:   Intimate Partner Violence:   . Fear of Current or Ex-Partner:   . Emotionally Abused:   [name] Physically Abused:   . Sexually Abused:      Allergies  Allergen Reactions  . Strawberry Extract Hives  . Sulfa Antibiotics Hives and Itching     Outpatient Medications Prior to Visit  Medication Sig Dispense Refill  . Accu-Chek Softclix Lancets lancets Use as instructed to check blood sugar three times daily. E11.65 100 each 6  . Blood Glucose Monitoring Suppl (ACCU-CHEK GUIDE ME) w/Device KIT 1 kit  by Does not apply route in the morning, at noon, and at bedtime. Use as instructed to check blood sugar three times daily. E11.65 1 kit 0  . glucose blood (ACCU-CHEK GUIDE) test strip Use as instructed to check blood sugar three times daily. E11.65 100 each 6  . insulin aspart (NOVOLOG FLEXPEN) 100 UNIT/ML FlexPen Sliding scale three times daily: 0-150: 0 151-200: 2 units  201-250: 4 units  251-300: 6 units  301-350: 8 units  351- 400 10 units   >400  12 units (Patient taking differently: 10 unit with meals Sliding scale three times daily: 0-150: 0 151-200: 2 units  201-250: 4 units  251-300: 6 units  301-350:  8 units  351- 400 10 units   >400  12 units) 15 mL 11  . Insulin Glargine (BASAGLAR KWIKPEN) 100 UNIT/ML Inject 0.44 mLs (44 Units total) into the skin daily. (Patient taking differently: Inject 70 Units into the skin daily. ) 15 mL 2  . Insulin Pen Needle (B-D ULTRAFINE III SHORT PEN) 31G X 8 MM MISC 1 application by Does not apply route daily. 90 each 3  . Insulin Pen Needle (PEN NEEDLES) 31G X 8 MM MISC Use as directed 100 each 1  . Lancet Devices (ADJUSTABLE LANCING DEVICE) MISC Inject 1 each into the skin 3 (three) times daily. 100 each 5  . LATUDA 40 MG TABS tablet SMARTSIG:1 Tablet(s) By Mouth Every Evening    . lipase/protease/amylase 24000-76000 units CPEP Take 1 capsule (24,000 Units total) by mouth 3 (three) times daily with meals. 270 capsule 1  . rosuvastatin (CRESTOR) 10 MG tablet Take 1 tablet (10 mg total) by mouth daily. 90 tablet 3  . sacubitril-valsartan (ENTRESTO) 24-26 MG Take 1 tablet by mouth 2 (two) times daily. 60 tablet 5  . dicyclomine (BENTYL) 10 MG capsule Take 1 capsule (10 mg total) by mouth 3 (three) times daily before meals for 7 days. 21 capsule 0   No facility-administered medications prior to visit.      Review of Systems  Constitutional: Negative for appetite change, fatigue, fever and unexpected weight change.  HENT: Negative.   Eyes: Negative for  visual disturbance.  Respiratory: Negative for apnea, cough, choking, chest tightness, shortness of breath, wheezing and stridor.   Cardiovascular: Negative for chest pain, palpitations and leg swelling.  Gastrointestinal: Positive for nausea and vomiting. Negative for diarrhea.  Endocrine: Negative for polydipsia and polyuria.  Genitourinary: Negative.   Skin: Negative.   Neurological: Negative.   Hematological: Negative.   Psychiatric/Behavioral: Positive for dysphoric mood. Negative for self-injury, sleep disturbance and suicidal ideas. The patient is not nervous/anxious.        Objective:   Physical Exam There were no vitals filed for this visit. No exam this is a telephone visit  BMP Latest Ref Rng & Units 05/08/2020 04/28/2020 03/11/2020  Glucose 70 - 99 mg/dL 200(H) 145(H) 359(H)  BUN 6 - 20 mg/dL _0 Creatinine 0.44 - 1.00 mg/dL 0.92 0.86 0.87  BUN/Creat Ratio 9 - 23 - - 9  Sodium 135 - 145 mmol/L 138 138 138  Potassium 3.5 - 5.1 mmol/L 3.9 3.9 4.0  Chloride 98 - 111 mmol/L 103 107 98  CO2 22 - 32 mmol/L 26 20(L) 24  Calcium 8.9 - 10.3 mg/dL 8.8(L) 8.9 9.2   Hepatic Function Latest Ref Rng & Units 03/11/2020 02/22/2020 02/21/2020  Total Protein 6.0 - 8.5 g/dL 7.8 5.9(L) 6.1(L)  Albumin 3.8 - 4.8 g/dL 3.8 1.5(L) 1.5(L)  AST 0 - 40 IU/L 63(H) 23 22  ALT 0 - 32 IU/L _1 Alk Phosphatase 39 - 117 IU/L 174(H) 122 112  Total Bilirubin 0.0 - 1.2 mg/dL 0.2 0.6 0.5  Bilirubin, Direct 0.0 - 0.2 mg/dL - - -   CBC Latest Ref Rng & Units 03/11/2020 02/26/2020 02/25/2020  WBC 3.4 - 10.8 x10E3/uL 6.6 10.8(H) 12.8(H)  Hemoglobin 11.1 - 15.9 g/dL 11.5 8.0(L) 8.5(L)  Hematocrit 34.0 - 46.6 % 36.5 25.6(L) 26.9(L)  Platelets 150 - 450 x10E3/uL 307 344 328   Lipase     Component Value Date/Time   LIPASE 420 (H) 03/11/2020 1146  Assessment & Plan:  I personally reviewed all images and lab data in the [facility] system as well as any outside material available during this office  visit and agree with the  radiology impressions.   Chronic combined systolic and diastolic CHF (congestive heart failure) (HCC) We will continue Entresto at this time  HYPERTENSION, BENIGN SYSTEMIC Continue Entresto for hypertension and heart failure  Acute pancreatitis without infection or necrosis Chronic pancreatitis will add thiamine therapy as the patient is still consuming alcohol and advised to reduce alcohol consumption to 0  Diabetes type 2, uncontrolled ([place]) Type 2 diabetes uncontrolled asked the patient to use her Basaglar in the morning if she cannot be compliant taking it at night  Hypercholesteremia Patient is advised to continue Rosuvastatin for now   Malie was seen today for diabetes.  Diagnoses and all orders for this visit:  Chronic combined systolic and diastolic CHF (congestive heart failure) ([place])  HYPERTENSION, BENIGN SYSTEMIC  Alcohol-induced acute pancreatitis without infection or necrosis  Uncontrolled type 2 diabetes mellitus with hyperglycemia ([place])  Hypercholesteremia  Other orders -     ondansetron (ZOFRAN) 4 MG tablet; Take 1 tablet (4 mg total) by mouth every 8 (eight) hours as needed for nausea or vomiting. -     thiamine 100 MG tablet; Take 1 tablet (100 mg total) by mouth daily.     Follow Up Instructions: Patient knows nausea medicine and vitamins will be sent to our pharmacy and a follow-up visit will occur in 1 month in the clinic   I discussed the assessment and treatment plan with the patient. The patient was provided an opportunity to ask questions and all were answered. The patient agreed with the plan and demonstrated an understanding of the instructions.   The patient was advised to call back or seek an in-person evaluation if the symptoms worsen or if the condition fails to improve as anticipated.  I provided 30 minutes of non-face-to-face time during this encounter  including  median intraservice time , review of notes, labs,  imaging, medications  and explaining diagnosis and management to the patient .    Asencion Noble, MD

## 2020-05-15 ENCOUNTER — Other Ambulatory Visit: Payer: Self-pay

## 2020-05-15 ENCOUNTER — Ambulatory Visit: Payer: Medicaid Other | Attending: Critical Care Medicine | Admitting: Pharmacist

## 2020-05-15 ENCOUNTER — Encounter: Payer: Self-pay | Admitting: Pharmacist

## 2020-05-15 DIAGNOSIS — F17210 Nicotine dependence, cigarettes, uncomplicated: Secondary | ICD-10-CM | POA: Insufficient documentation

## 2020-05-15 DIAGNOSIS — Z833 Family history of diabetes mellitus: Secondary | ICD-10-CM | POA: Insufficient documentation

## 2020-05-15 DIAGNOSIS — E1165 Type 2 diabetes mellitus with hyperglycemia: Secondary | ICD-10-CM

## 2020-05-15 DIAGNOSIS — E118 Type 2 diabetes mellitus with unspecified complications: Secondary | ICD-10-CM | POA: Diagnosis not present

## 2020-05-15 DIAGNOSIS — Z794 Long term (current) use of insulin: Secondary | ICD-10-CM | POA: Diagnosis not present

## 2020-05-15 LAB — POCT GLYCOSYLATED HEMOGLOBIN (HGB A1C): Hemoglobin A1C: 8.4 % — AB (ref 4.0–5.6)

## 2020-05-15 NOTE — Progress Notes (Signed)
     S:    PCP: Dr. Joya Gaskins  No chief complaint on file.  Patient arrives in good spirits. Presents for diabetes evaluation, education, and management. Patient was referred and last seen by PCP on 05/13/20.   Family/Social History:  - FHx: DM (mother, father); HD (mother, father); HTN (mother, father) - Tobacco: current 0.5 PPD smoker - Alcohol: denies current use   Insurance coverage/medication affordability: [place] Medicaid  Patient reports adherence with medications.  Current diabetes medications include: Basaglar 70 units daily, Novolog 14 units BID-TID before meals.  Patient denies hypoglycemic events.  Patient reported dietary habits:  - Pt w/ hx of homelessness; now living in a hotel and eats what she can - She states that she has difficulty eating regularly.   - She receives mostly processed foods including boiled eggs, orange juice, bacon, and sandwich meats  -She has increased her water intake  Patient-reported exercise habits:  - Pt reports increase in physical activity    Patient denies polyuria. Reports some polydipsia.  Patient reports neuropathy. Patient reports visual changes. Patient reports self foot exams.     O:     Home CBGs:  Reports post-prandial/random levels mostly in the 170s-190s. Reports several outliers in the 300s. Does not routinely check in the morning.    Lab Results  Component Value Date   HGBA1C 8.4 (A) 05/15/2020   There were no vitals filed for this visit.  Lipid Panel     Component Value Date/Time   CHOL 186 04/28/2020 1511   CHOL 120 03/17/2017 0000   TRIG 123 04/28/2020 1511   HDL 53 04/28/2020 1511   HDL 54 03/17/2017 0000   CHOLHDL 3.5 04/28/2020 1511   VLDL 25 04/28/2020 1511   LDLCALC 108 (H) 04/28/2020 1511   LDLCALC 30 03/17/2017 0000   Clinical ASCVD: Yes The ASCVD Risk score Mikey Bussing DC Jr., et al., 2013) failed to calculate for the following reasons:   The patient has a prior MI or stroke diagnosis     A/P: Diabetes longstanding currently uncontrolled but improved . A1c down today from 12.7 to 8.4.  Patient is able to verbalize appropriate hypoglycemia management plan. Patient is adherent with medication and has titrated basal insulin to 70 units daily. I recommend to continue her current regimen for now. -Continue current medication regimen.  -Extensively discussed pathophysiology of DM, recommended lifestyle interventions, dietary effects on glycemic control -Counseled on s/sx of and management of hypoglycemia -A1c  Written patient instructions provided. Total time in face to face counseling 30 minutes.   Follow up in 1 month with PCP.   Benard Halsted, PharmD, [place] [phone]

## 2020-05-16 ENCOUNTER — Telehealth: Payer: Self-pay

## 2020-05-16 NOTE — Telephone Encounter (Signed)
Spoke with patient to remind of missed remote transmission 

## 2020-05-20 ENCOUNTER — Encounter: Payer: Self-pay | Admitting: Cardiology

## 2020-05-20 ENCOUNTER — Other Ambulatory Visit: Payer: Self-pay

## 2020-05-20 ENCOUNTER — Ambulatory Visit: Payer: Medicaid Other | Admitting: Cardiology

## 2020-05-20 VITALS — BP 128/76 | HR 84 | Ht 64.0 in | Wt 192.0 lb

## 2020-05-20 DIAGNOSIS — I442 Atrioventricular block, complete: Secondary | ICD-10-CM

## 2020-05-20 LAB — CUP PACEART INCLINIC DEVICE CHECK
Battery Remaining Longevity: 116 mo
Battery Voltage: 3.01 V
Brady Statistic RA Percent Paced: 0.04 %
Brady Statistic RV Percent Paced: 99.98 %
Date Time Interrogation Session: 20210615145000
Implantable Lead Implant Date: 20210310
Implantable Lead Implant Date: 20210310
Implantable Lead Location: 753859
Implantable Lead Location: 753860
Implantable Pulse Generator Implant Date: 20210310
Lead Channel Impedance Value: 425 Ohm
Lead Channel Impedance Value: 487.5 Ohm
Lead Channel Pacing Threshold Amplitude: 0.625 V
Lead Channel Pacing Threshold Amplitude: 0.75 V
Lead Channel Pacing Threshold Pulse Width: 0.5 ms
Lead Channel Pacing Threshold Pulse Width: 0.5 ms
Lead Channel Sensing Intrinsic Amplitude: 5 mV
Lead Channel Setting Pacing Amplitude: 1 V
Lead Channel Setting Pacing Amplitude: 1.625
Lead Channel Setting Pacing Pulse Width: 0.5 ms
Lead Channel Setting Sensing Sensitivity: 2 mV
Pulse Gen Model: 2272
Pulse Gen Serial Number: 3802095

## 2020-05-20 NOTE — Progress Notes (Signed)
Electrophysiology Office Note   Date:  05/20/2020   ID:  MAHARI VANKIRK, DOB [birth year], MRN [id number]  PCP:  [name], MD  Cardiologist:  Radford Pax Primary Electrophysiologist: Arthella Headings Meredith Leeds, MD    Chief Complaint: pacemaker   History of Present Illness: Sarah Long is a 49 y.o. female who is being seen today for the evaluation of pacemaker at the request of [name], MD. Presenting today for electrophysiology evaluation.  She has a history of poorly controlled diabetes, hypertension, hyperlipidemia, ongoing tobacco abuse who presented to the hospital March 2021 with complete heart block.  She is now status post [place] dual-chamber pacemaker implanted 02/13/2020.  Today, she denies symptoms of palpitations, chest pain, shortness of breath, orthopnea, PND, lower extremity edema, claudication, dizziness, presyncope, syncope, bleeding, or neurologic sequela. The patient is tolerating medications without difficulties.  She is currently feeling well.  She has no chest pain or shortness of breath.  She is able to do all of her daily activities without restriction.  She has no issues with her pacemaker.  She thinks like it has made her feel much improved.   Past Medical History:  Diagnosis Date  . Abnormal Pap smear 1998  . Abnormal vaginal bleeding 12/19/2011  . Bacterial infection   . Bipolar 1 disorder ([place])   . Blister of second toe of left foot 11/21/2016  . Candida vaginitis 07/2007  . Depression    recently added wellbutrin-has not taken yet for bipolar  . Diabetes in pregnancy   . Diabetes mellitus    nph 20U qam and qpm, regular with meals  . Diabetic ketoacidosis without coma associated with type 2 diabetes mellitus ([place])   . Fibroid   . Galactorrhea of right breast 2008  . H/O amenorrhea 07/2007  . H/O dizziness 10/14/11  . H/O dysmenorrhea 2010  . H/O menorrhagia 10/14/11  . H/O varicella   . Headache(784.0)   . Heavy vaginal bleeding due to  contraceptive injection use 10/12/2011   Depo Provera  . Herpes   . HSV-2 infection 01/03/2009  . Hx: UTI (urinary tract infection) 2009  . Hypertension    on aldomet  . Increased BMI 2010  . Irregular uterine bleeding 04/04/2012   Pt has mirena   . Obesity 10/14/11  . Oligomenorrhea 07/2007  . Pelvic pain in female   . Preterm labor   . Trichomonas   . Yeast infection    Past Surgical History:  Procedure Laterality Date  . CESAREAN SECTION  1991  . LEFT HEART CATH AND CORONARY ANGIOGRAPHY N/A 02/10/2020   Procedure: LEFT HEART CATH AND CORONARY ANGIOGRAPHY;  Surgeon: Troy Sine, MD;  Location: [facility];  Service: Cardiovascular;  Laterality: N/A;  . PACEMAKER IMPLANT N/A 02/13/2020   Procedure: PACEMAKER IMPLANT;  Surgeon: Constance Haw, MD;  Location: [facility];  Service: Cardiovascular;  Laterality: N/A;  . TEMPORARY PACEMAKER N/A 02/10/2020   Procedure: TEMPORARY PACEMAKER;  Surgeon: Troy Sine, MD;  Location: [facility];  Service: Cardiovascular;  Laterality: N/A;     Current Outpatient Medications  Medication Sig Dispense Refill  . Accu-Chek Softclix Lancets lancets Use as instructed to check blood sugar three times daily. E11.65 100 each 6  . Blood Glucose Monitoring Suppl (ACCU-CHEK GUIDE ME) w/Device KIT 1 kit by Does not apply route in the morning, at noon, and at bedtime. Use as instructed to check blood sugar three times daily. E11.65 1  kit 0  . dicyclomine (BENTYL) 10 MG capsule Take 1 capsule (10 mg total) by mouth 3 (three) times daily before meals for 7 days. 21 capsule 0  . glucose blood (ACCU-CHEK GUIDE) test strip Use as instructed to check blood sugar three times daily. E11.65 100 each 6  . insulin aspart (NOVOLOG FLEXPEN) 100 UNIT/ML FlexPen Sliding scale three times daily: 0-150: 0 151-200: 2 units  201-250: 4 units  251-300: 6 units  301-350: 8 units  351- 400 10 units   >400  12 units (Patient taking differently: 10 unit  with meals Sliding scale three times daily: 0-150: 0 151-200: 2 units  201-250: 4 units  251-300: 6 units  301-350: 8 units  351- 400 10 units   >400  12 units) 15 mL 11  . Insulin Glargine (BASAGLAR KWIKPEN) 100 UNIT/ML Inject 0.44 mLs (44 Units total) into the skin daily. (Patient taking differently: Inject 70 Units into the skin daily. ) 15 mL 2  . Insulin Pen Needle (B-D ULTRAFINE III SHORT PEN) 31G X 8 MM MISC 1 application by Does not apply route daily. 90 each 3  . Insulin Pen Needle (PEN NEEDLES) 31G X 8 MM MISC Use as directed 100 each 1  . Lancet Devices (ADJUSTABLE LANCING DEVICE) MISC Inject 1 each into the skin 3 (three) times daily. 100 each 5  . LATUDA 40 MG TABS tablet SMARTSIG:1 Tablet(s) By Mouth Every Evening    . lipase/protease/amylase 24000-76000 units CPEP Take 1 capsule (24,000 Units total) by mouth 3 (three) times daily with meals. 270 capsule 1  . ondansetron (ZOFRAN) 4 MG tablet Take 1 tablet (4 mg total) by mouth every 8 (eight) hours as needed for nausea or vomiting. 30 tablet 0  . rosuvastatin (CRESTOR) 10 MG tablet Take 1 tablet (10 mg total) by mouth daily. 90 tablet 3  . sacubitril-valsartan (ENTRESTO) 24-26 MG Take 1 tablet by mouth 2 (two) times daily. 60 tablet 5  . thiamine 100 MG tablet Take 1 tablet (100 mg total) by mouth daily. 60 tablet 1   No current facility-administered medications for this visit.    Allergies:   Strawberry extract and Sulfa antibiotics   Social History:  The patient  reports that she quit smoking about 3 months ago. Her smoking use included cigarettes. She has a 10.00 pack-year smoking history. She has never used smokeless tobacco. She reports that she does not drink alcohol and does not use drugs.   Family History:  The patient's family history includes Diabetes in her father and mother; Heart disease in her father and mother; Hypertension in her father and mother; Stroke in her maternal grandfather.    ROS:  Please see the  history of present illness.   Otherwise, review of systems is positive for none.   All other systems are reviewed and negative.    PHYSICAL EXAM: VS:  BP 128/76   Pulse 84   Ht '5\' 4"'$  (1.626 m)   Wt 192 lb (87.1 kg)   LMP 05/06/2020 (Approximate)   BMI 32.96 kg/m  , BMI Body mass index is 32.96 kg/m. GEN: Well nourished, well developed, in no acute distress  HEENT: normal  Neck: no JVD, carotid bruits, or masses Cardiac: RRR; no murmurs, rubs, or gallops,no edema  Respiratory:  clear to auscultation bilaterally, normal work of breathing GI: soft, nontender, nondistended, + BS MS: no deformity or atrophy  Skin: warm and dry, device pocket is well healed Neuro:  Strength and sensation  are intact Psych: euthymic mood, full affect  EKG:  EKG is ordered today. Personal review of the ekg ordered shows sinus rhythm, ventricular paced, rate 84  Device interrogation is reviewed today in detail.  See PaceArt for details.   Recent Labs: 02/11/2020: TSH 4.396 03/11/2020: ALT 21; Hemoglobin 11.5; Magnesium 1.4; Platelets 307 05/08/2020: BUN 17; Creatinine, Ser 0.92; Potassium 3.9; Sodium 138    Lipid Panel     Component Value Date/Time   CHOL 186 04/28/2020 1511   CHOL 120 03/17/2017 0000   TRIG 123 04/28/2020 1511   HDL 53 04/28/2020 1511   HDL 54 03/17/2017 0000   CHOLHDL 3.5 04/28/2020 1511   VLDL 25 04/28/2020 1511   LDLCALC 108 (H) 04/28/2020 1511   LDLCALC 30 03/17/2017 0000     Wt Readings from Last 3 Encounters:  05/20/20 192 lb (87.1 kg)  04/28/20 193 lb (87.5 kg)  04/01/20 192 lb 12.8 oz (87.5 kg)      Other studies Reviewed: Additional studies/ records that were reviewed today include: TTE 02/10/2020 Review of the above records today demonstrates:  1. Technically difficult; mild to moderate global reduction in LV  systolic function; severe LVH (myocardium with speckeld appearance;  suggest further evaluation for amyloid); mild MR.  2. Left ventricular ejection  fraction, by estimation, is 40 to 45%. The  left ventricle has mildly decreased function. The left ventricle  demonstrates global hypokinesis. There is severe left ventricular  hypertrophy. Left ventricular diastolic parameters  are indeterminate.  3. Right ventricular systolic function is normal. The right ventricular  size is normal. There is normal pulmonary artery systolic pressure.  4. The mitral valve is normal in structure and function. Mild mitral  valve regurgitation. No evidence of mitral stenosis.  5. The aortic valve is tricuspid. Aortic valve regurgitation is not  visualized. No aortic stenosis is present.  6. The inferior vena cava is dilated in size with <50% respiratory  variability, suggesting right atrial pressure of 15 mmHg.  Left heart cath 02/10/2020 Normal coronary arteries.  LVEDP 32 mmHg    ASSESSMENT AND PLAN:  1.  Complete heart block: Status post [employer] dual-chamber pacemaker implanted 02/13/2020.  Device functioning appropriately.  No changes at this time.  2.  Hypertension: Currently well controlled    Current medicines are reviewed at length with the patient today.   The patient does not have concerns regarding her medicines.  The following changes were made today:  none  Labs/ tests ordered today include:  Orders Placed This Encounter  Procedures  . EKG 12-Lead     Disposition:   FU with Tito Ausmus 9 months  Signed, Lamarco Gudiel Meredith Leeds, MD  05/20/2020 3:02 PM     [address] [place] [address] [phone] (office) [phone] (fax)

## 2020-05-22 ENCOUNTER — Ambulatory Visit (HOSPITAL_COMMUNITY)
Admission: RE | Admit: 2020-05-22 | Discharge: 2020-05-22 | Disposition: A | Discharge status: Home / Self Care | Payer: Medicaid Other | Source: Ambulatory Visit | Attending: Internal Medicine | Admitting: Internal Medicine

## 2020-05-22 ENCOUNTER — Encounter (HOSPITAL_COMMUNITY): Payer: Self-pay

## 2020-05-22 ENCOUNTER — Other Ambulatory Visit: Payer: Self-pay

## 2020-05-22 VITALS — BP 110/78 | HR 90 | Wt 191.4 lb

## 2020-05-22 DIAGNOSIS — Z79899 Other long term (current) drug therapy: Secondary | ICD-10-CM | POA: Insufficient documentation

## 2020-05-22 DIAGNOSIS — Z950 Presence of cardiac pacemaker: Secondary | ICD-10-CM | POA: Insufficient documentation

## 2020-05-22 DIAGNOSIS — F17210 Nicotine dependence, cigarettes, uncomplicated: Secondary | ICD-10-CM | POA: Insufficient documentation

## 2020-05-22 DIAGNOSIS — I5032 Chronic diastolic (congestive) heart failure: Secondary | ICD-10-CM | POA: Diagnosis present

## 2020-05-22 DIAGNOSIS — I110 Hypertensive heart disease with heart failure: Secondary | ICD-10-CM | POA: Diagnosis not present

## 2020-05-22 DIAGNOSIS — E119 Type 2 diabetes mellitus without complications: Secondary | ICD-10-CM | POA: Insufficient documentation

## 2020-05-22 DIAGNOSIS — I428 Other cardiomyopathies: Secondary | ICD-10-CM | POA: Diagnosis not present

## 2020-05-22 DIAGNOSIS — I442 Atrioventricular block, complete: Secondary | ICD-10-CM | POA: Insufficient documentation

## 2020-05-22 DIAGNOSIS — Z791 Long term (current) use of non-steroidal anti-inflammatories (NSAID): Secondary | ICD-10-CM | POA: Diagnosis not present

## 2020-05-22 DIAGNOSIS — E785 Hyperlipidemia, unspecified: Secondary | ICD-10-CM | POA: Insufficient documentation

## 2020-05-22 DIAGNOSIS — I5042 Chronic combined systolic (congestive) and diastolic (congestive) heart failure: Secondary | ICD-10-CM

## 2020-05-22 MED ORDER — ENTRESTO 49-51 MG PO TABS: 1.0000 | ORAL_TABLET | Freq: Two times a day (BID) | ORAL | 11 refills | Status: DC

## 2020-05-22 MED ORDER — CHANTIX STARTING MONTH PAK 0.5 MG X 11 & 1 MG X 42 PO TABS: ORAL_TABLET | ORAL | 5 refills | Status: DC

## 2020-05-22 MED FILL — ENTRESTO 49 MG-51 MG TABLET: 49-51 | 60 days supply | Qty: 60 | Fill #0

## 2020-05-22 MED FILL — CHANTIX STARTING MONTH BOX: 0.5 MG X 11 | 30 days supply | Qty: 53 | Fill #0

## 2020-05-22 NOTE — Patient Instructions (Signed)
It was a pleasure seeing you today!  MEDICATIONS: -We are changing your medications today -Increase Entresto to 49-51 mg (1 tablet) twice a day. You can take 2 tablets twice a day of your current Entresto supply to use it up. -Start Chantix starter pack to help quit smoking -Call if you have questions about your medications.  LABS: -We will call you if your labs need attention.  NEXT APPOINTMENT: Return to clinic in 4 weeks with Dr. Aundra Dubin for office visit.  In general, to take care of your heart failure: -Limit your fluid intake to 2 Liters (half-gallon) per day.   -Limit your salt intake to ideally 2-3 grams (2000-3000 mg) per day. -Weigh yourself daily and record, and bring that "weight diary" to your next appointment.  (Weight gain of 2-3 pounds in 1 day typically means fluid weight.) -The medications for your heart are to help your heart and help you live longer.   -Please contact us before stopping any of your heart medications.  Call the clinic at [phone] with questions or to reschedule future appointments.

## 2020-05-23 NOTE — Progress Notes (Signed)
PCP: [name], MD Cardiology: Dr. Aundra Dubin  HPI: 49 y.o. with history of type 2 diabetes, HTN, hyperlipidemia, complete heart block with PPM, and chronic diastolic CHF who was referred to Dr. Aundra Dubin by Dr. Joya Gaskins for evaluation of CHF. Patient has had diabetes since age 63 and has had HTN for at least 5 years. She is a smoker and has a history of prior ETOH abuse with ETOH pancreatitis. In 02/2020, she was admitted with DKA. She was found to have associated complete heart block that did not resolve, she had [employer] Jude PPM placed. She had an echocardiogram showing EF 40-45% with severe LV hypertrophy. She did not have a cardiac MRI prior to PPM placement. Cath in 3/21 showed no significant CAD (nonischemic cardiomyopathy).    At last HF clinic visit with Dr. Aundra Dubin, she was doing well with no dyspnea with walking on flat ground, or up a flight of stairs. She had no orthopnea/PND.  Today she returns to HF clinic for pharmacist medication titration. At last visit with MD, she was started on Entresto 24-26 mg BID. She reports doing much better and tolerating Entresto with no side effects. However, she does report having difficulty taking the Entresto twice a day due to coming home late from her job. Often, she only takes the [place] once a day. She does not have a blood pressure cuff to check blood pressure at home. She does have a scale but has not started weighing herself. She has felt dizzy and lightheaded before but patient attributes symptoms to hypoglycemia. Has not had any syncopal episodes. She denies shortness of breath, even with moderate exertion. She can lay flat if needed. Denies chest pain, orthopnea/PND, or lower extremity swelling. She walks and plays with her children for exercise. Patient is able to perform all ADLs independently. She has had a poor appetite due to pancreatitis. She tries to stay away from fast food but eats tuna salad, chicken salad, fruits, grapes, watermelon. However, she  does report eating chips, fried foods, and drinks Gatorade often. She was able to stop smoking cigarettes when she was discharged from the hospital but has now had to start back. She is smoking 10 cigarettes a day. Patient reports using ibuprofen 3-4 times a week for generalized pain.  [name] Shortness of breath/dyspnea on exertion? no  . Orthopnea/PND? no . Edema? no . Lightheadedness/dizziness? Yes, but mostly due to hypoglycemia . Daily weights at home? no . Blood pressure/heart rate monitoring at home? no . Following low-sodium/fluid-restricted diet? no  HF Medications: - Entresto 24-26 mg BID - patient reports taking it only once a day on many days because she forgets the second dose  Has the patient been experiencing any side effects to the medications prescribed?  no  Does the patient have any problems obtaining medications due to transportation or finances?   No, patient has [place] Medicaid  Understanding of regimen: good Understanding of indications: good Potential of compliance: fair Patient understands to avoid NSAIDs. Patient understands to avoid decongestants.    Pertinent Lab Values: . Serum creatinine 0.92, BUN 17, Potassium 3.9, Sodium 138  Vital Signs: . Weight: 191 lbs (last clinic weight: 192 lbs) . Blood pressure: 110/78 . Heart rate: 90 BPM   Plan: 1. Chronic systolic CHF: Echo in 03/3153 with EF 40-45%, severe LV hypertrophy.  This was found in association with complete heart block.  Nonischemic cardiomyopathy, cath in 02/2020 showed no significant CAD.  Etiology still uncertain, may be due to long-standing HTN  but association with complete heart block is concerning for cardiac sarcoidosis or cardiac amyloidosis. No known family history of either sarcoidosis or amyloidosis.  -NYHA class I-II symptoms. Euvolemic on exam. - Increase Entresto to 49/51 mg BID. Repeat BMET next visit. - Myeloma panel was negative. Visual and quantitative assessment (grade 1, H/CLL equal  1.07) are equivocal for transthyretin amyloidosis. - Dr. Aundra Dubin has arranged for high resolution CT chest to assess for any sign of pulmonary sarcoidosis.    2. HTN: As above, increasing Entresto.    3. Hyperlipidemia: continue rosuvastatin  4. Active smoker: I strongly urged her to quit. She is ready to quit. Start Chantix for smoking cessation. 5. Type 2 diabetes: Per by PCP. Hemoglobin A1C 8.4% (04/2020) 6. Complete heart block: She has a [employer] Jude PPM and is pacing her RV > 99% of the time.  As above, workup for cardiac sarcoidosis.  Summary: 1) Medication changes: Based on clinical presentation, vital signs and recent labs will increase Entresto to 49-51 mg BID and start Chantix for smoking cessation. - Encouraged patient to stop using Ibuprofen for pain 2) Labs: last BMET was stable. Repeat BMET at next visit with Dr. Aundra Dubin 3) Follow-up: 4 weeks with Dr. Phillips Hay, PharmD PGY1 [place] Resident  Audry Riles, PharmD, BCPS, [facility], CPP Heart Failure Clinic Pharmacist [phone]

## 2020-06-04 ENCOUNTER — Other Ambulatory Visit: Payer: Self-pay | Admitting: Cardiology

## 2020-06-04 MED FILL — LATUDA 40 MG TABLET: 40 | 30 days supply | Qty: 30 | Fill #0

## 2020-06-10 ENCOUNTER — Encounter (HOSPITAL_COMMUNITY): Payer: Medicaid Other | Admitting: Cardiology

## 2020-06-11 MED FILL — ENTRESTO 49 MG-51 MG TABLET: 49-51 | 30 days supply | Qty: 60 | Fill #0

## 2020-06-11 MED FILL — CHANTIX STARTING MONTH BOX: 0.5 MG X 11 | 30 days supply | Qty: 53 | Fill #0

## 2020-06-16 MED FILL — LANTUS SOLOSTAR 100 UNITS/M: 100 | 34 days supply | Qty: 15 | Fill #2

## 2020-06-16 MED FILL — CREON DR 12,000 UNITS CAP: 12000-38000 | 16 days supply | Qty: 100 | Fill #1

## 2020-06-19 ENCOUNTER — Ambulatory Visit: Payer: Medicaid Other | Admitting: Critical Care Medicine

## 2020-06-27 MED FILL — LANTUS SOLOSTAR 100 UNITS/M: 100 | 34 days supply | Qty: 15 | Fill #2

## 2020-06-27 MED FILL — FAMOTIDINE 20 MG TABS: 20 | 30 days supply | Qty: 60 | Fill #0

## 2020-06-27 MED FILL — CREON DR 12,000 UNITS CAP: 12000-38000 | 16 days supply | Qty: 100 | Fill #1

## 2020-07-09 ENCOUNTER — Ambulatory Visit: Payer: Medicaid Other | Admitting: Critical Care Medicine

## 2020-07-16 ENCOUNTER — Other Ambulatory Visit: Payer: Self-pay

## 2020-07-16 ENCOUNTER — Encounter: Payer: Self-pay | Admitting: Critical Care Medicine

## 2020-07-16 ENCOUNTER — Ambulatory Visit: Payer: MEDICAID | Attending: Critical Care Medicine | Admitting: Critical Care Medicine

## 2020-07-16 VITALS — BP 156/92 | HR 93 | Temp 98.6°F | Resp 16 | Wt 189.2 lb

## 2020-07-16 DIAGNOSIS — N898 Other specified noninflammatory disorders of vagina: Secondary | ICD-10-CM | POA: Insufficient documentation

## 2020-07-16 DIAGNOSIS — K852 Alcohol induced acute pancreatitis without necrosis or infection: Secondary | ICD-10-CM

## 2020-07-16 DIAGNOSIS — I10 Essential (primary) hypertension: Secondary | ICD-10-CM

## 2020-07-16 DIAGNOSIS — Z794 Long term (current) use of insulin: Secondary | ICD-10-CM

## 2020-07-16 DIAGNOSIS — I5042 Chronic combined systolic (congestive) and diastolic (congestive) heart failure: Secondary | ICD-10-CM

## 2020-07-16 DIAGNOSIS — E1165 Type 2 diabetes mellitus with hyperglycemia: Secondary | ICD-10-CM

## 2020-07-16 DIAGNOSIS — K029 Dental caries, unspecified: Secondary | ICD-10-CM

## 2020-07-16 DIAGNOSIS — E7800 Pure hypercholesterolemia, unspecified: Secondary | ICD-10-CM

## 2020-07-16 DIAGNOSIS — E1142 Type 2 diabetes mellitus with diabetic polyneuropathy: Secondary | ICD-10-CM

## 2020-07-16 DIAGNOSIS — F17200 Nicotine dependence, unspecified, uncomplicated: Secondary | ICD-10-CM

## 2020-07-16 DIAGNOSIS — I442 Atrioventricular block, complete: Secondary | ICD-10-CM

## 2020-07-16 DIAGNOSIS — Z124 Encounter for screening for malignant neoplasm of cervix: Secondary | ICD-10-CM

## 2020-07-16 LAB — GLUCOSE, POCT (MANUAL RESULT ENTRY): POC Glucose: 354 mg/dl — AB (ref 70–99)

## 2020-07-16 MED ORDER — THIAMINE HCL 100 MG PO TABS: 100.0000 mg | ORAL_TABLET | Freq: Every day | ORAL | 1 refills | Status: DC

## 2020-07-16 MED ORDER — NOVOLOG FLEXPEN 100 UNIT/ML ~~LOC~~ SOPN: PEN_INJECTOR | SUBCUTANEOUS | 11 refills | Status: DC

## 2020-07-16 MED ORDER — BASAGLAR KWIKPEN 100 UNIT/ML ~~LOC~~ SOPN: 80.0000 [IU] | PEN_INJECTOR | Freq: Every day | SUBCUTANEOUS | 4 refills | Status: DC

## 2020-07-16 MED ORDER — PANCRELIPASE (LIP-PROT-AMYL) 24000-76000 UNITS PO CPEP: 24000.0000 [IU] | ORAL_CAPSULE | Freq: Three times a day (TID) | ORAL | 1 refills | Status: DC

## 2020-07-16 MED ORDER — BD PEN NEEDLE SHORT U/F 31G X 8 MM MISC: 1.0000 "application " | Freq: Every day | 3 refills | Status: DC

## 2020-07-16 MED FILL — CREON DR 24,000 UNITS CAP: 24000-76000 | 33 days supply | Qty: 100 | Fill #0

## 2020-07-16 MED FILL — TRUEPLUS PEN NDL 31GX5/16: 31G X 8 MM | 30 days supply | Qty: 100 | Fill #0

## 2020-07-16 MED FILL — NOVOLOG FLEXPEN SYRINGE: 100 | 33 days supply | Qty: 12 | Fill #0

## 2020-07-16 NOTE — Assessment & Plan Note (Signed)
Patient given dental resources and now she has Medicaid she is to pursue these for tooth extractions

## 2020-07-16 NOTE — Assessment & Plan Note (Signed)
Continue current dose of lipid therapy

## 2020-07-16 NOTE — Assessment & Plan Note (Signed)
History of recurrent pancreatitis we will follow-up metabolic panel and lipase

## 2020-07-16 NOTE — Assessment & Plan Note (Signed)
Hypertension with plus minus control  Continue Entresto

## 2020-07-16 NOTE — Assessment & Plan Note (Signed)
Cervical vaginal screen will be obtained

## 2020-07-16 NOTE — Assessment & Plan Note (Signed)
Poorly controlled diabetes issue of lack of dietary compliance and lack of insulin compliance patient encouraged to take her insulin as prescribed and refills were given diet was given to the patient again

## 2020-07-16 NOTE — Patient Instructions (Addendum)
Increase basaglar to 80 units daily  No change in sliding scale insulin  No other medication changes  A cervical vaginal swab will be obtained  Please get you eye exam  A referral to GYN will be made  Please get your dental work done  Please eat three planned meals a day, use diet below as a guide  Return Dr Joya Gaskins 2 months, Return Luke in two weeks  Focus on smoking cessatoin   Diabetes Mellitus and [place] care is an important part of your health, especially when you have diabetes. Diabetes may cause you to have problems because of poor blood flow (circulation) to your feet and legs, which can cause your skin to:  Become thinner and drier.  Break more easily.  Heal more slowly.  Peel and crack. You may also have nerve damage (neuropathy) in your legs and feet, causing decreased feeling in them. This means that you may not notice minor injuries to your feet that could lead to more serious problems. Noticing and addressing any potential problems early is the best way to prevent future foot problems. How to care for your feet Foot hygiene  Wash your feet daily with warm water and mild soap. Do not use hot water. Then, pat your feet and the areas between your toes until they are completely dry. Do not soak your feet as this can dry your skin.  Trim your toenails straight across. Do not dig under them or around the cuticle. File the edges of your nails with an emery board or nail file.  Apply a moisturizing lotion or petroleum jelly to the skin on your feet and to dry, brittle toenails. Use lotion that does not contain alcohol and is unscented. Do not apply lotion between your toes. Shoes and socks  Wear clean socks or stockings every day. Make sure they are not too tight. Do not wear knee-high stockings since they may decrease blood flow to your legs.  Wear shoes that fit properly and have enough cushioning. Always look in your shoes before you put them on to be sure  there are no objects inside.  To break in new shoes, wear them for just a few hours a day. This prevents injuries on your feet. Wounds, scrapes, corns, and calluses  Check your feet daily for blisters, cuts, bruises, sores, and redness. If you cannot see the bottom of your feet, use a mirror or ask someone for help.  Do not cut corns or calluses or try to remove them with medicine.  If you find a minor scrape, cut, or break in the skin on your feet, keep it and the skin around it clean and dry. You may clean these areas with mild soap and water. Do not clean the area with peroxide, alcohol, or iodine.  If you have a wound, scrape, corn, or callus on your foot, look at it several times a day to make sure it is healing and not infected. Check for: ? Redness, swelling, or pain. ? Fluid or blood. ? Warmth. ? Pus or a bad smell. General instructions  Do not cross your legs. This may decrease blood flow to your feet.  Do not use heating pads or hot water bottles on your feet. They may burn your skin. If you have lost feeling in your feet or legs, you may not know this is happening until it is too late.  Protect your feet from hot and cold by wearing shoes, such as at the  beach or on hot pavement.  Schedule a complete foot exam at least once a year (annually) or more often if you have foot problems. If you have foot problems, report any cuts, sores, or bruises to your health care provider immediately. Contact a health care provider if:  You have a medical condition that increases your risk of infection and you have any cuts, sores, or bruises on your feet.  You have an injury that is not healing.  You have redness on your legs or feet.  You feel burning or tingling in your legs or feet.  You have pain or cramps in your legs and feet.  Your legs or feet are numb.  Your feet always feel cold.  You have pain around a toenail. Get help right away if:  You have a wound, scrape, corn,  or callus on your foot and: ? You have pain, swelling, or redness that gets worse. ? You have fluid or blood coming from the wound, scrape, corn, or callus. ? Your wound, scrape, corn, or callus feels warm to the touch. ? You have pus or a bad smell coming from the wound, scrape, corn, or callus. ? You have a fever. ? You have a red line going up your leg. Summary  Check your feet every day for cuts, sores, red spots, swelling, and blisters.  Moisturize feet and legs daily.  Wear shoes that fit properly and have enough cushioning.  If you have foot problems, report any cuts, sores, or bruises to your health care provider immediately.  Schedule a complete foot exam at least once a year (annually) or more often if you have foot problems. This information is not intended to replace advice given to you by your health care provider. Make sure you discuss any questions you have with your health care provider. Document Revised: 08/15/2019 Document Reviewed: 12/24/2016 Elsevier Patient Education  [place].  Diabetes Mellitus and Nutrition, Adult When you have diabetes (diabetes mellitus), it is very important to have healthy eating habits because your blood sugar (glucose) levels are greatly affected by what you eat and drink. Eating healthy foods in the appropriate amounts, at about the same times every day, can help you:  Control your blood glucose.  Lower your risk of heart disease.  Improve your blood pressure.  Reach or maintain a healthy weight. Every person with diabetes is different, and each person has different needs for a meal plan. Your health care provider may recommend that you work with a diet and nutrition specialist (dietitian) to make a meal plan that is best for you. Your meal plan may vary depending on factors such as:  The calories you need.  The medicines you take.  Your weight.  Your blood glucose, blood pressure, and cholesterol levels.  Your  activity level.  Other health conditions you have, such as heart or kidney disease. How do carbohydrates affect me? Carbohydrates, also called carbs, affect your blood glucose level more than any other type of food. Eating carbs naturally raises the amount of glucose in your blood. Carb counting is a method for keeping track of how many carbs you eat. Counting carbs is important to keep your blood glucose at a healthy level, especially if you use insulin or take certain oral diabetes medicines. It is important to know how many carbs you can safely have in each meal. This is different for every person. Your dietitian can help you calculate how many carbs you should have at  each meal and for each snack. Foods that contain carbs include:  Bread, cereal, rice, pasta, and crackers.  Potatoes and corn.  Peas, beans, and lentils.  Milk and yogurt.  Fruit and juice.  Desserts, such as cakes, cookies, ice cream, and candy. How does alcohol affect me? Alcohol can cause a sudden decrease in blood glucose (hypoglycemia), especially if you use insulin or take certain oral diabetes medicines. Hypoglycemia can be a life-threatening condition. Symptoms of hypoglycemia (sleepiness, dizziness, and confusion) are similar to symptoms of having too much alcohol. If your health care provider says that alcohol is safe for you, follow these guidelines:  Limit alcohol intake to no more than 1 drink per day for nonpregnant women and 2 drinks per day for men. One drink equals 12 oz of beer, 5 oz of wine, or 1 oz of hard liquor.  Do not drink on an empty stomach.  Keep yourself hydrated with water, diet soda, or unsweetened iced tea.  Keep in mind that regular soda, juice, and other mixers may contain a lot of sugar and must be counted as carbs. What are tips for following this plan?  Reading food labels  Start by checking the serving size on the "Nutrition Facts" label of packaged foods and drinks. The  amount of calories, carbs, fats, and other nutrients listed on the label is based on one serving of the item. Many items contain more than one serving per package.  Check the total grams (g) of carbs in one serving. You can calculate the number of servings of carbs in one serving by dividing the total carbs by 15. For example, if a food has 30 g of total carbs, it would be equal to 2 servings of carbs.  Check the number of grams (g) of saturated and trans fats in one serving. Choose foods that have low or no amount of these fats.  Check the number of milligrams (mg) of salt (sodium) in one serving. Most people should limit total sodium intake to less than 2,300 mg per day.  Always check the nutrition information of foods labeled as "low-fat" or "nonfat". These foods may be higher in added sugar or refined carbs and should be avoided.  Talk to your dietitian to identify your daily goals for nutrients listed on the label. Shopping  Avoid buying canned, premade, or processed foods. These foods tend to be high in fat, sodium, and added sugar.  Shop around the outside edge of the grocery store. This includes fresh fruits and vegetables, bulk grains, fresh meats, and fresh dairy. Cooking  Use low-heat cooking methods, such as baking, instead of high-heat cooking methods like deep frying.  Cook using healthy oils, such as olive, canola, or sunflower oil.  Avoid cooking with butter, cream, or high-fat meats. Meal planning  Eat meals and snacks regularly, preferably at the same times every day. Avoid going long periods of time without eating.  Eat foods high in fiber, such as fresh fruits, vegetables, beans, and whole grains. Talk to your dietitian about how many servings of carbs you can eat at each meal.  Eat 4-6 ounces (oz) of lean protein each day, such as lean meat, chicken, fish, eggs, or tofu. One oz of lean protein is equal to: ? 1 oz of meat, chicken, or fish. ? 1 egg. ?  cup of  tofu.  Eat some foods each day that contain healthy fats, such as avocado, nuts, seeds, and fish. Lifestyle  Check your blood glucose regularly.  Exercise regularly as told by your health care provider. This may include: ? 150 minutes of moderate-intensity or vigorous-intensity exercise each week. This could be brisk walking, biking, or water aerobics. ? Stretching and doing strength exercises, such as yoga or weightlifting, at least 2 times a week.  Take medicines as told by your health care provider.  Do not use any products that contain nicotine or tobacco, such as cigarettes and e-cigarettes. If you need help quitting, ask your health care provider.  Work with a Social worker or diabetes educator to identify strategies to manage stress and any emotional and social challenges. Questions to ask a health care provider  Do I need to meet with a diabetes educator?  Do I need to meet with a dietitian?  What number can I call if I have questions?  When are the best times to check my blood glucose? Where to find more information:  American Diabetes Association: diabetes.org  Academy of Nutrition and Dietetics: www.eatright.[employer] of Diabetes and Digestive and Kidney Diseases (NIH): [employer] Summary  A healthy meal plan will help you control your blood glucose and maintain a healthy lifestyle.  Working with a diet and nutrition specialist (dietitian) can help you make a meal plan that is best for you.  Keep in mind that carbohydrates (carbs) and alcohol have immediate effects on your blood glucose levels. It is important to count carbs and to use alcohol carefully. This information is not intended to replace advice given to you by your health care provider. Make sure you discuss any questions you have with your health care provider. Document Revised: 11/04/2017 Document Reviewed: 12/27/2016 Elsevier Patient Education  [place].  Tobacco Use  Disorder Tobacco use disorder (TUD) occurs when a person craves, seeks, and uses tobacco, regardless of the consequences. This disorder can cause problems with mental and physical health. It can affect your ability to have healthy relationships, and it can keep you from meeting your responsibilities at work, home, or school. Tobacco may be:  Smoked as a cigarette or cigar.  Inhaled using e-cigarettes.  Smoked in a pipe or hookah.  Chewed as smokeless tobacco.  Inhaled into the nostrils as snuff. Tobacco products contain a dangerous chemical called nicotine, which is very addictive. Nicotine triggers hormones that make the body feel stimulated and works on areas of the brain that make you feel good. These effects can make it hard for people to quit nicotine. Tobacco contains many other unsafe chemicals that can damage almost every organ in the body. Smoking tobacco also puts others in danger due to fire risk and possible health problems caused by breathing in secondhand smoke. What are the signs or symptoms? Symptoms of TUD may include:  Being unable to slow down or stop your tobacco use.  Spending an abnormal amount of time getting or using tobacco.  Craving tobacco. Cravings may last for up to 6 months after quitting.  Tobacco use that: ? Interferes with your work, school, or home life. ? Interferes with your personal and social relationships. ? Makes you give up activities that you once enjoyed or found important.  Using tobacco even though you know that it is: ? Dangerous or bad for your health or someone else's health. ? Causing problems in your life.  Needing more and more of the substance to get the same effect (developing tolerance).  Experiencing unpleasant symptoms if you do not use the substance (withdrawal). Withdrawal symptoms may include: ? Depressed, anxious, or irritable  mood. ? Difficulty concentrating. ? Increased appetite. ? Restlessness or trouble  sleeping.  Using the substance to avoid withdrawal. How is this diagnosed? This condition may be diagnosed based on:  Your current and past tobacco use. Your health care provider may ask questions about how your tobacco use affects your life.  A physical exam. You may be diagnosed with TUD if you have at least two symptoms within a 40-month period. How is this treated? This condition is treated by stopping tobacco use. Many people are unable to quit on their own and need help. Treatment may include:  Nicotine replacement therapy (NRT). NRT provides nicotine without the other harmful chemicals in tobacco. NRT gradually lowers the dosage of nicotine in the body and reduces withdrawal symptoms. NRT is available as: ? Over-the-counter gums, lozenges, and skin patches. ? Prescription mouth inhalers and nasal sprays.  Medicine that acts on the brain to reduce cravings and withdrawal symptoms.  A type of talk therapy that examines your triggers for tobacco use, how to avoid them, and how to cope with cravings (behavioral therapy).  Hypnosis. This may help with withdrawal symptoms.  Joining a support group for others coping with TUD. The best treatment for TUD is usually a combination of medicine, talk therapy, and support groups. Recovery can be a long process. Many people start using tobacco again after stopping (relapse). If you relapse, it does not mean that treatment will not work. Follow these instructions at home:  Lifestyle  Do not use any products that contain nicotine or tobacco, such as cigarettes and e-cigarettes.  Avoid things that trigger tobacco use as much as you can. Triggers include people and situations that usually cause you to use tobacco.  Avoid drinks that contain caffeine, including coffee. These may worsen some withdrawal symptoms.  Find ways to manage stress. Wanting to smoke may cause stress, and stress can make you want to smoke. Relaxation techniques such as  deep breathing, meditation, and yoga may help.  Attend support groups as needed. These groups are an important part of long-term recovery for many people. General instructions  Take over-the-counter and prescription medicines only as told by your health care provider.  Check with your health care provider before taking any new prescription or over-the-counter medicines.  Decide on a friend, family member, or smoking quit-line (such as 1-800-QUIT-NOW in the [place] that you can call or text when you feel the urge to smoke or when you need help coping with cravings.  Keep all follow-up visits as told by your health care provider and therapist. This is important. Contact a health care provider if:  You are not able to take your medicines as prescribed.  Your symptoms get worse, even with treatment. Summary  Tobacco use disorder (TUD) occurs when a person craves, seeks, and uses tobacco regardless of the consequences.  This condition may be diagnosed based on your current and past tobacco use and a physical exam.  Many people are unable to quit on their own and need help. Recovery can be a long process.  The most effective treatment for TUD is usually a combination of medicine, talk therapy, and support groups. This information is not intended to replace advice given to you by your health care provider. Make sure you discuss any questions you have with your health care provider. Document Revised: 11/09/2017 Document Reviewed: 11/09/2017 Elsevier Patient Education  2020 [employer].

## 2020-07-16 NOTE — Assessment & Plan Note (Signed)
  .   Current smoking consumption amount: 5 cigarettes a day  . Dicsussion on advise Patient's willingness to quit: Cardiovascular effects  . Methods to quit smoking discussed: Nicotine replacement  . Medication management of smoking session drugs discussed: Nicotine replacement   . Setting quit date not determined  . Follow-up arranged short-term follow-up   Time spent counseling the patient: 5 minutes

## 2020-07-16 NOTE — Assessment & Plan Note (Signed)
Pacemaker is in place no changes made

## 2020-07-16 NOTE — Progress Notes (Signed)
Subjective:    Patient ID: Sarah Long, female    DOB: 13-Jul-1971, 49 y.o.   MRN: [id number]  History of Present Illness:  This is a 49 year old female history of type 2 diabetes and bipolar disorder.  Patient was last seen in June and was found to have hypertension not well controlled, a holosystolic murmur which was found to be physiologic with normal echocardiogram, type 2 diabetes poorly controlled, bipolar disorder, homelessness, and ongoing tobacco use.  The patient now is moved back to the homeless shelter away from the hotel she was housed in over the pandemic.  Note today on arrival blood sugar was greater than 500 she states she has not taken her insulin recently.  The patient has been compliant with her blood pressure medications however is still smoking a pack a day of cigarettes.  Patient has been compliant with her [name] that she receives from family services of the [place].  The patient does have bunions in the feet but is yet to have her appointment with podiatry  10/02/2019 Patient is seen in follow-up from an August visit for poorly controlled type 2 diabetes, tobacco use, hyperlipidemia.  The patient lives in a shelter at [employer] and does work Building surveyor.  The patient often forgets to take her blood pressure medicine but she is taking her insulin daily.  Unfortunate her diet has been very noncompliant.  She eats lots of fast food and drinks sodas that contain sugar and sugar Gatorade.  She apparently had a very carbohydrate filled lunch along with sugar Sprite and sugar Gatorade and comes in with a CBG of 501 at this visit.  The patient is maintaining Lantus 80 units daily note she is already had her flu vaccine  She has improvement in her anxiety and depression.  She does complain of significant menstrual cramps that come on with her periods  The patient has tried ibuprofen 6 mg dose without relief.  03/11/2020 Has been in hosp since last ov The pt was  admitted severely ill with pancreatitis, AKI, multiple electrolyte disorders, DKA, complete heart block and evidence of heart failure without evidence of coronary artery disease on cardiac catheterization.  Below is the discharge summary  Dc summary:  Admit date: 02/09/2020 Discharge date: 02/26/2020 Consultations: Cardiology, [facility] Admitted From: home Disposition: home  Discharge Diagnoses:  Principal Problem:   Complete heart block ([place]) Active Problems:   Acute pancreatitis without infection or necrosis   Diabetic ketoacidosis without coma associated with type 2 diabetes mellitus (HCC)   Elevated troponin   NSTEMI (non-ST elevated myocardial infarction) (HCC)   Lactic acid acidosis   Endotracheally intubated   ACS (acute coronary syndrome) (HCC)   Prolonged QT interval   Acute on chronic combined systolic and diastolic CHF (congestive heart failure) [facility])   Hospital Course Summary: 49 yr old obese F w/ significant PMHx alcohol abuse, tobacco abuse, IDDM, HTN, HLD and morbid obesity presented to [facility] with 10/10 chest pain. Per the triage note pt reported vomiting all day, being short of breath and having severe chest pain feelingnear syncopal, although shedenied loss of consciousness oractualsyncopal episode.  ED Course: Afebrile, BP133/2mHg HR 41 RR 26 O2 sat 94% . CBG>'600mg'$ /dl.BBrandt.Salm AG 30. Na 131. K 5.3. Cr 2.85. Lipase 1879 AST 582. ALT 294. EKG showed an Idioventricular rhythm with LAD, AVB, LVH and an inferior infarct (QRS 162 ms and QT/QTc 574/467 ms). Troponin 730>>2000.Chest x-ray showed mild interstitial prominence suggesting pulmonary edema, low lung volumes. Patient  went into complete AVB with HR of 39 while in EDrequiringexternal pacer. Done she becomes somnolent after IV Versed requiring intubation.Hospital course:Cardiology consulted and she was admitted to ICU. She was also started on Unasyn for possible aspiration pneumonia.Patient  underwent LHC on 3/7 which was negative for CAD. She had temporary venous pacemaker advanced at the same time. Echo with EF of 40 to 45%, global hypokinesis, severe LVH and indeterminate diastolic function but no other significant finding. Patient was extubated on 02/11/2020.She eventually had permanent PPM/ICD implanton 02/13/2020.Patient also received evaluation/treatment foracute pancreatitis,transaminitis,rhabdomyolysis, significant hypocalcemia/vitamin D deficiency and iron deficiency anemiawhile in ICU.Patient transferred to [facility] service on 3/11. Cardiac issues been stable. Patient however has continued to complain of postprandial abdominal bloating, frequent diarrhea associated with worsening leukocytosis on lab work. Repeat CT on 3/16 showed persistent/possible worsening of pancreatitis and started on Rocephin/Flagyl. GI consulted and leukocytosis has been downtrending on antibiotics. She has also had generalized edemaduring the hospital course with echo findings as above.  Acute pancreatitis with abdominal pain and watery diarrhea-Leukocytosis got worse on 3/16 with persistent diarrhea , stool study negative for cdiff, stool PCR panel negative , stool culturesnegative for E. Coli/Campylobacter/Shigella/Salmonellaetc.Repeat CT scan done on 3/16 showed worsening of pancreatitis-started on Cipro and Flagyl, seen by [facility], Dr. Paulita Fujita and startedonpancrelipase supplement. She completed 5 days antibiotic while here.WBC count andlipase trending down,diet advanced to soft and regular diet which she has been tolerating well >48 hrs.Cleared by GI to discharge on current dose of probiotic/pancreatic enzymes. Okay to use as needed loperamide but patient no longer has diarrhea and dicontinued this medication in concern for prolonged qtc. She was receiving IV fentanyl for abdominal pain during the hospitals course. Changed to oral meds today. Reports cramping pain when having BM or on eating,  will discharge on Bentyl prn. Follow-up [facility], Dr Paulita Fujita, in few weeks.  Anasarca:Patient noted to be clinically edematouswithfluid overloadduring the hospital course. She hadnet positive 18.7 L since admission perI's and O'sdocumentation.S/p 1 dose of Lasix on 3/17, x1 on 3/18, x1 on 3/19.Improving ,encourage ambulation.Nutrition supplement.Discontinued  sodium bicarb tablets that she has been recieveing to avoid salt/fluid retention.Started onoral lasix yesterday which she is tolerating well with stable creatinine.  Upper extremity edema is almost resolved but still has lower extremity edema.  Recommended to take Lasix 20 mg daily for rest of the week then change to as needed use.  Advised to take extra potassium with Lasix to avoid hypokalemia in the setting of prolonged QTC.    Non-STEMI,Complete Heart Block/bifascicular block (RBBB and LAFB) :Patientpresented with severe chest painand noted to be in complete heart block.   Seen by cardiology and underwent cardiac cath with no acute blockages. She is now s/p PPM and ICD on 02/13/2020. Device interrogation normal.-Cardiologyfollowed up on patient 3/22, Steri-Strips removed. Wound healing well with no signs of redness, discharge or edema. Cardiology cleared for discharge and stated okay to drive. Okay for bath/shower but advised no lotions, creams or ointments or powders on device site.  Acutesystolic CHFwith acute hypoxic respiratory failure:Present on admission with chest x-ray suggestive of cardiomegaly/pulmonary edema. Echo on 3/7with LVEF of 40 to 45%, global hypokinesis, severe LVH.Required intubation in the ED. Patient received significant amount of IV fluids for pancreatitis/rhabdomyolysis initially and then received IV diuretics, now saturating well on room air. DC sodium bicarb tablets to avoid salt/fluid retention.  Discharged on low-dose oral Lasix with potassium as discussed above.  To follow-up  PCP, cardiology clinic in a week for  further fluid/electrolyte balance assessment and titration of diuretics.  Not on ACE inhibitors due to AKI and hypotension.  Uncontrolled insulin dependent DM-2 withDKA: Present on admission. Patient was struggling to get insulin supplies prior to admission. Presented with DKA that has resolved. A1c 12.7%. Hospital course complicated byhypoglycemicas well ashyperglycemicepisodes.C-peptide and insulin level within normal range.Currently on Lantus 22 units twice a day,meal coverage and sliding scaleswith blood glucose well controlled, less than 200. Seen by diabetes educator and follow-up with [facility] and wellness advised.  Medications ordered through wellness clinic.  AKI-multifactorial including cardiorenal, rhabdoand iatrogenic from ARB/HCTZ and NSAIDs Now resolved. Avoid nephrotoxins. DC sodium bicarb tablets as metabolic acidosis now resolved. Monitor with periodic labs as outpatient while on oral lasix.  Hypokalemia/hypomagnesemia/hypophosphatemia-replaced as needed throughout the hospital course. Currently on scheduled potassium 81mq BID but potassium still only at 3.5, now also started on oral lasix.  Will continue potassium 40 mEq supplements upon discharge and advised additional potassium supplementation while taking Lasix.  Hopefully potassium level will come up once magnesium level improves and since diarrhea resolved.  Hypocalcemia/vitamin D deficiency: Calcium 4.8. Multifactorial including vitamin D deficiency (<4.2), rhabdo and acute pancreatitis. PTH is appropriately high. Urine calcium appropriately low at 4.3.-s/p Calcium gluconate 3 g. Continue P.o. vitamin D 50,000 IU weekly and 2000 daily-suspect poor observation in the setting of acute pancreatitis.Calciumlevel nowimproving, today isat 8.0,with lastalbuminlevel at1.5on 3/19.  QTc prolongation-monitoredon telemetryduring the hospital course, did have  arrhythmias/electrolyte abnormalities as explained above, keep mag close to 2(currently at 1.8),potassiumclose to4(currently at 3.5).  Patient received 1 g of IV magnesium yesterday and 2 g of IV magnesium today.  Will discharge on oral magnesium supplements.Avoid QT prolonging agents,QTCnowdown from 619to 526 now (also EKG showing paced rhythm so possibly atrial QTC slightly lower).  Rhabdomyolysis: Due to alcohol? Statin? CK [id number] CK-MB low.Received IV fluids during the hospital course. Statins held.CK normalizednow.  Recommend repeat lipid profile as outpatient.  Alcohol use disorder:Reportedly drinks about 5-6 drinks including liquors.Counseledto quit. -ConsultedTOC for resources.  Multivitamin/thiamine  Tobacco use disorder: Smokes about a pack a day.-Cessation counseling.Nicotine patchwhile here  Obesity:Body mass index is 37.63 kg/m.Counseled regarding risks and diet modifications.  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  She states her children were helping her find a place when she got sick and got admitted to the hospital.  Patient met withsocial worker and outpatient referrals have been placed for medication assistance, home health and DME..  Patient already has a PCP Dr. WJoya Gaskinswith home she plans to follow-up in 5 to 7 days.  Since discharge the patient states she is somewhat improved.  She has less swelling and edema.  She feels she may actually be too dry.  She has been taking insulin as prescribed.  She is no longer drinking alcohol.  She remains in a motel situation.  She has difficulty with sleeping.  She is quit smoking tobacco products as well.  Note blood sugar at home was 203 and on arrival here it was 342  Patient currently denies chest pain does have dyspnea with  exertion.  She needs follow-up appointment scheduled for cardiology and gastroenterology.  She also is requesting a scale  Wt Readings from Last 3 Encounters: 03/11/20 : 189 lb 9.6 oz (86 kg) 02/25/20 : 221 lb 9.6 oz (100.5 kg) 10/02/19 : 209 lb 12.8 oz (95.2 kg)  04/01/2020 Since the last visit the patient is improved.  She is eating better.  She complains of irritation in the mouth.  Her feet are peeling.  She is back at work and she works at [employer].  She has an upcoming cardiology appointment scheduled.  She is yet to hear from gastroenterology about her pancreatitis follow-up.  She now is on long-acting insulin 64 units daily and NovoLog 10 units before meals 3 times daily her fasting sugars now been in the 1 70-1 80 range.  She complains of a toothache in the right upper jaw along with right ear pain and wishes this to be evaluated she is on Latuda now for her bipolar disorder through family services [facility] the [place]  Note at the last visit her magnesium and phosphorus levels have returned to normal  05/13/2020 Patient seen in return follow-up with history of combined systolic diastolic heart failure and also pacemaker placement due to complete heart block.  Patient's been seen in cardiology clinic and now is on the [facility] 24/26 1 daily is off furosemide at this time.  She states her dyspnea is improved.  Blood glucoses have improved as well in the 1 7190 range however is a bit higher than has been in the past and she does have nausea and some emesis.  The patient does have chronic pancreatitis and is staying on her pancreatic enzyme replacement therapy.  She states Zofran has helped nausea in the past.  She still living in a hotel environment.  She still working nights.  Sometimes she will skip her evening dose of Basaglar.  She is up to 70 units daily on the [facility]  07/16/2020 This patient is seen return follow-up for combined systolic diastolic heart failure, hypertension, chronic  pancreatitis with pancreatic insufficiency, type 2 diabetes, hypercholesterolemia.  On arrival blood sugar was 358.  This patient's been having significant difficulty and following a significant diet.  The patient states that based on her work hours she is not able to eat meals on a regular basis she is now working from 3-11 and often misses several meals.  The patient states that she has had some vaginal discharge and some pain in the vaginal area.  She is compliant with her insulin.  She did run out of the long-acting medication is missed some of her insulin.  She tends to stop insulin at times.  She is not compliant with her diet.  Patient is still living in a hotel environment.  She denies any chest pain or shortness of breath.  Still has significant dental caries as just received any form of dental care. The patient states her urine has been cloudy and there is been increased odor or vaginal discharge in her urine.  She complains of abdominal pain lower and back pain.  She denies any alcohol.  She still smoking 4 to 5 cigarettes a day.   Past Medical History:  Diagnosis Date  . Abnormal Pap smear 1998  . Abnormal vaginal bleeding 12/19/2011  . Bacterial infection   . Bipolar 1 disorder ([facility])   . Blister of second toe of left foot 11/21/2016  . Candida vaginitis 07/2007  . Depression    recently added wellbutrin-has not taken yet for bipolar  . Diabetes in pregnancy   . Diabetes mellitus    nph 20U qam and qpm, regular with meals  . Diabetic ketoacidosis without coma associated with type 2 diabetes mellitus ([place])   . Fibroid   . Galactorrhea of  right breast 2008  . H/O amenorrhea 07/2007  . H/O dizziness 10/14/11  . H/O dysmenorrhea 2010  . H/O menorrhagia 10/14/11  . H/O varicella   . Headache(784.0)   . Heavy vaginal bleeding due to contraceptive injection use 10/12/2011   Depo Provera  . Herpes   . HSV-2 infection 01/03/2009  . Hx: UTI (urinary tract infection) 2009  . Hypertension     on aldomet  . Increased BMI 2010  . Irregular uterine bleeding 04/04/2012   Pt has mirena   . Obesity 10/14/11  . Oligomenorrhea 07/2007  . Pelvic pain in female   . Preterm labor   . Trichomonas   . Yeast infection      Family History  Problem Relation Age of Onset  . Hypertension Mother   . Diabetes Mother   . Heart disease Mother   . Hypertension Father   . Diabetes Father   . Heart disease Father   . Stroke Maternal Grandfather   . Other Neg Hx      Social History   Socioeconomic History  . Marital status: Married    Spouse name: Not on file  . Number of children: Not on file  . Years of education: Not on file  . Highest education level: Not on file  Occupational History  . Not on file  Tobacco Use  . Smoking status: Former Smoker    Packs/day: 0.50    Years: 20.00    Pack years: 10.00    Types: Cigarettes  . Smokeless tobacco: Never Used  Vaping Use  . Vaping Use: Never used  Substance and Sexual Activity  . Alcohol use: No  . Drug use: No  . Sexual activity: Yes    Birth control/protection: None  Other Topics Concern  . Not on file  Social History Narrative  . Not on file   Social Determinants of Health   Financial Resource Strain:   . Difficulty of Paying Living Expenses:   Food Insecurity:   . Worried About Programme researcher, broadcasting/film/video in the Last Year:   . Barista in the Last Year:   Transportation Needs:   . Freight forwarder (Medical):   [name] Lack of Transportation (Non-Medical):   Physical Activity:   . Days of Exercise per Week:   . Minutes of Exercise per Session:   Stress:   . Feeling of Stress :   Social Connections:   . Frequency of Communication with Friends and Family:   . Frequency of Social Gatherings with Friends and Family:   . Attends Religious Services:   . Active Member of Clubs or Organizations:   . Attends Banker Meetings:   [name] Marital Status:   Intimate Partner Violence:   . Fear of Current or  Ex-Partner:   . Emotionally Abused:   [name] Physically Abused:   . Sexually Abused:      Allergies  Allergen Reactions  . Strawberry Extract Hives  . Sulfa Antibiotics Hives and Itching     Outpatient Medications Prior to Visit  Medication Sig Dispense Refill  . Accu-Chek Softclix Lancets lancets Use as instructed to check blood sugar three times daily. E11.65 100 each 6  . Blood Glucose Monitoring Suppl (ACCU-CHEK GUIDE ME) w/Device KIT 1 kit by Does not apply route in the morning, at noon, and at bedtime. Use as instructed to check blood sugar three times daily. E11.65 1 kit 0  . diphenhydrAMINE (BENADRYL) 25 mg capsule Take 25  mg by mouth every 6 (six) hours as needed for allergies.    [name] glucose blood (ACCU-CHEK GUIDE) test strip Use as instructed to check blood sugar three times daily. E11.65 100 each 6  . Lancet Devices (ADJUSTABLE LANCING DEVICE) MISC Inject 1 each into the skin 3 (three) times daily. 100 each 5  . LATUDA 40 MG TABS tablet SMARTSIG:1 Tablet(s) By Mouth Every Evening    . ondansetron (ZOFRAN) 4 MG tablet Take 1 tablet (4 mg total) by mouth every 8 (eight) hours as needed for nausea or vomiting. 30 tablet 0  . rosuvastatin (CRESTOR) 10 MG tablet Take 1 tablet (10 mg total) by mouth daily. 90 tablet 3  . sacubitril-valsartan (ENTRESTO) 49-51 MG Take 1 tablet by mouth 2 (two) times daily. 60 tablet 11  . varenicline (CHANTIX STARTING MONTH PAK) 0.5 MG X 11 & 1 MG X 42 tablet Take one 0.5 mg tablet by mouth once daily for 3 days, then increase to one 0.5 mg tablet twice daily for 4 days, then increase to one 1 mg tablet twice daily. 53 tablet 5  . insulin aspart (NOVOLOG FLEXPEN) 100 UNIT/ML FlexPen Sliding scale three times daily: 0-150: 0 151-200: 2 units  201-250: 4 units  251-300: 6 units  301-350: 8 units  351- 400 10 units   >400  12 units (Patient taking differently: 10 unit with meals Sliding scale three times daily: 0-150: 0 151-200: 2 units  201-250: 4 units  251-300:  6 units  301-350: 8 units  351- 400 10 units   >400  12 units) 15 mL 11  . Insulin Glargine (BASAGLAR KWIKPEN) 100 UNIT/ML Inject 0.44 mLs (44 Units total) into the skin daily. (Patient taking differently: Inject 70 Units into the skin daily. ) 15 mL 2  . Insulin Pen Needle (B-D ULTRAFINE III SHORT PEN) 31G X 8 MM MISC 1 application by Does not apply route daily. 90 each 3  . Insulin Pen Needle (PEN NEEDLES) 31G X 8 MM MISC Use as directed 100 each 1  . lipase/protease/amylase 24000-76000 units CPEP Take 1 capsule (24,000 Units total) by mouth 3 (three) times daily with meals. 270 capsule 1  . thiamine 100 MG tablet Take 1 tablet (100 mg total) by mouth daily. 60 tablet 1  . dicyclomine (BENTYL) 10 MG capsule Take 1 capsule (10 mg total) by mouth 3 (three) times daily before meals for 7 days. 21 capsule 0   No facility-administered medications prior to visit.      Review of Systems  Constitutional: Negative for appetite change, fatigue, fever and unexpected weight change.  HENT: Negative.   Eyes: Negative for visual disturbance.  Respiratory: Negative for apnea, cough, choking, chest tightness, shortness of breath, wheezing and stridor.   Cardiovascular: Negative for chest pain, palpitations and leg swelling.  Gastrointestinal: Positive for nausea and vomiting. Negative for diarrhea.  Endocrine: Negative for polydipsia and polyuria.  Genitourinary: Negative.   Skin: Negative.   Neurological: Negative.   Hematological: Negative.   Psychiatric/Behavioral: Positive for dysphoric mood. Negative for self-injury, sleep disturbance and suicidal ideas. The patient is not nervous/anxious.        Objective:   Physical Exam Vitals:   07/16/20 0846  BP: (!) 156/92  Pulse: 93  Resp: 16  Temp: 98.6 F (37 C)  SpO2: 95%  Weight: 189 lb 3.2 oz (85.8 kg)    Gen: Pleasant, well-nourished, in no distress,  normal affect  ENT: No lesions,  mouth clear,  oropharynx  clear, no postnasal  drip  Neck: No JVD, no TMG, no carotid bruits  Lungs: No use of accessory muscles, no dullness to percussion, clear without rales or rhonchi  Cardiovascular: RRR, heart sounds normal, no murmur or gallops, no peripheral edema  Abdomen: soft and NT, no HSM,  BS normal  Musculoskeletal: No deformities, no cyanosis or clubbing  Neuro: alert, non focal  Skin: Warm, no lesions or rashes Foot exam was normal   BMP Latest Ref Rng & Units 05/08/2020 04/28/2020 03/11/2020  Glucose 70 - 99 mg/dL 200(H) 145(H) 359(H)  BUN 6 - 20 mg/dL '17 15 8  '$ Creatinine 0.44 - 1.00 mg/dL 0.92 0.86 0.87  BUN/Creat Ratio 9 - 23 - - 9  Sodium 135 - 145 mmol/L 138 138 138  Potassium 3.5 - 5.1 mmol/L 3.9 3.9 4.0  Chloride 98 - 111 mmol/L 103 107 98  CO2 22 - 32 mmol/L 26 20(L) 24  Calcium 8.9 - 10.3 mg/dL 8.8(L) 8.9 9.2   Hepatic Function Latest Ref Rng & Units 03/11/2020 02/22/2020 02/21/2020  Total Protein 6.0 - 8.5 g/dL 7.8 5.9(L) 6.1(L)  Albumin 3.8 - 4.8 g/dL 3.8 1.5(L) 1.5(L)  AST 0 - 40 IU/L 63(H) 23 22  ALT 0 - 32 IU/L '21 24 29  '$ Alk Phosphatase 39 - 117 IU/L 174(H) 122 112  Total Bilirubin 0.0 - 1.2 mg/dL 0.2 0.6 0.5  Bilirubin, Direct 0.0 - 0.2 mg/dL - - -   CBC Latest Ref Rng & Units 03/11/2020 02/26/2020 02/25/2020  WBC 3.4 - 10.8 x10E3/uL 6.6 10.8(H) 12.8(H)  Hemoglobin 11.1 - 15.9 g/dL 11.5 8.0(L) 8.5(L)  Hematocrit 34.0 - 46.6 % 36.5 25.6(L) 26.9(L)  Platelets 150 - 450 x10E3/uL 307 344 328   Lipase     Component Value Date/Time   LIPASE 420 (H) 03/11/2020 1146        Assessment & Plan:  I personally reviewed all images and lab data in the [facility] system as well as any outside material available during this office visit and agree with the  radiology impressions.   Chronic combined systolic and diastolic CHF (congestive heart failure) (HCC) Stable at this time will continue Entresto twice daily  Heart block AV complete ([place]) Pacemaker is in place no changes made  HYPERTENSION, BENIGN  SYSTEMIC Hypertension with plus minus control  Continue Entresto  Dental caries Patient given dental resources and now she has Medicaid she is to pursue these for tooth extractions  Diabetes type 2, uncontrolled ([name]) Poorly controlled diabetes issue of lack of dietary compliance and lack of insulin compliance patient encouraged to take her insulin as prescribed and refills were given diet was given to the patient again  Hypercholesteremia Continue current dose of lipid therapy  Vaginal discharge Cervical vaginal screen will be obtained  Acute pancreatitis without infection or necrosis History of recurrent pancreatitis we will follow-up metabolic panel and lipase  Smoking    . Current smoking consumption amount: 5 cigarettes a day  . Dicsussion on advise Patient's willingness to quit: Cardiovascular effects  . Methods to quit smoking discussed: Nicotine replacement  . Medication management of smoking session drugs discussed: Nicotine replacement   . Setting quit date not determined  . Follow-up arranged short-term follow-up   Time spent counseling the patient: 5 minutes    Fantasia was seen today for diabetes and hypertension.  Diagnoses and all orders for this visit:  Uncontrolled type 2 diabetes mellitus with hyperglycemia ([place]) -     POCT glucose (manual entry) -  Comprehensive metabolic panel  Vaginal discharge -     Ambulatory referral to Gynecology -     Cervicovaginal ancillary only  Chronic combined systolic and diastolic CHF (congestive heart failure) (HCC)  Heart block AV complete ([place])  Dental caries  Alcohol-induced acute pancreatitis without infection or necrosis -     Comprehensive metabolic panel -     CBC with Differential/Platelet -     Lipase  Type 2 diabetes mellitus with diabetic polyneuropathy, with long-term current use of insulin (HCC) -     Comprehensive metabolic panel -     Insulin Pen Needle (B-D ULTRAFINE III SHORT PEN) 31G  X 8 MM MISC; 1 application by Does not apply route daily.  Cervical cancer screening -     Cancel: Cervicovaginal ancillary only  HYPERTENSION, BENIGN SYSTEMIC  Hypercholesteremia  Smoking  Other orders -     insulin aspart (NOVOLOG FLEXPEN) 100 UNIT/ML FlexPen; 10 unit with meals Sliding scale three times daily: 0-150: 0 151-200: 2 units  201-250: 4 units  251-300: 6 units  301-350: 8 units  351- 400 10 units   >400  12 units -     thiamine 100 MG tablet; Take 1 tablet (100 mg total) by mouth daily. -     Pancrelipase, Lip-Prot-Amyl, 24000-76000 units CPEP; Take 1 capsule (24,000 Units total) by mouth 3 (three) times daily with meals. -     Insulin Glargine (BASAGLAR KWIKPEN) 100 UNIT/ML; Inject 0.8 mLs (80 Units total) into the skin daily.

## 2020-07-16 NOTE — Assessment & Plan Note (Signed)
Stable at this time will continue Entresto twice daily

## 2020-07-17 ENCOUNTER — Telehealth: Payer: Self-pay

## 2020-07-17 ENCOUNTER — Other Ambulatory Visit: Payer: Self-pay | Admitting: Critical Care Medicine

## 2020-07-17 DIAGNOSIS — N760 Acute vaginitis: Secondary | ICD-10-CM | POA: Insufficient documentation

## 2020-07-17 DIAGNOSIS — E1165 Type 2 diabetes mellitus with hyperglycemia: Secondary | ICD-10-CM

## 2020-07-17 DIAGNOSIS — B9689 Other specified bacterial agents as the cause of diseases classified elsewhere: Secondary | ICD-10-CM

## 2020-07-17 LAB — CBC WITH DIFFERENTIAL/PLATELET
Basophils Absolute: 0 10*3/uL (ref 0.0–0.2)
Basos: 0 %
EOS (ABSOLUTE): 0.1 10*3/uL (ref 0.0–0.4)
Eos: 2 %
Hematocrit: 35.2 % (ref 34.0–46.6)
Hemoglobin: 11.6 g/dL (ref 11.1–15.9)
Immature Grans (Abs): 0 10*3/uL (ref 0.0–0.1)
Immature Granulocytes: 0 %
Lymphocytes Absolute: 1.8 10*3/uL (ref 0.7–3.1)
Lymphs: 28 %
MCH: 30 pg (ref 26.6–33.0)
MCHC: 33 g/dL (ref 31.5–35.7)
MCV: 91 fL (ref 79–97)
Monocytes Absolute: 0.3 10*3/uL (ref 0.1–0.9)
Monocytes: 5 %
Neutrophils Absolute: 4.2 10*3/uL (ref 1.4–7.0)
Neutrophils: 65 %
Platelets: 229 10*3/uL (ref 150–450)
RBC: 3.87 x10E6/uL (ref 3.77–5.28)
RDW: 13.7 % (ref 11.7–15.4)
WBC: 6.5 10*3/uL (ref 3.4–10.8)

## 2020-07-17 LAB — COMPREHENSIVE METABOLIC PANEL
ALT: 14 IU/L (ref 0–32)
AST: 16 IU/L (ref 0–40)
Albumin/Globulin Ratio: 1.2 (ref 1.2–2.2)
Albumin: 4.1 g/dL (ref 3.8–4.8)
Alkaline Phosphatase: 95 IU/L (ref 48–121)
BUN/Creatinine Ratio: 16 (ref 9–23)
BUN: 15 mg/dL (ref 6–24)
Bilirubin Total: <0.2 mg/dL (ref 0.0–1.2)
CO2: 20 mmol/L (ref 20–29)
Calcium: 9.3 mg/dL (ref 8.7–10.2)
Chloride: 101 mmol/L (ref 96–106)
Creatinine, Ser: 0.94 mg/dL (ref 0.57–1.00)
GFR calc Af Amer: 84 mL/min/{1.73_m2} (ref >59)
GFR calc non Af Amer: 73 mL/min/{1.73_m2} (ref >59)
Globulin, Total: 3.4 g/dL (ref 1.5–4.5)
Glucose: 359 mg/dL — ABNORMAL HIGH (ref 65–99)
Potassium: 4.3 mmol/L (ref 3.5–5.2)
Sodium: 136 mmol/L (ref 134–144)
Total Protein: 7.5 g/dL (ref 6.0–8.5)

## 2020-07-17 LAB — LIPASE: Lipase: 75 U/L — ABNORMAL HIGH (ref 14–72)

## 2020-07-17 LAB — CERVICOVAGINAL ANCILLARY ONLY
Bacterial Vaginitis (gardnerella): POSITIVE — AB
Candida Glabrata: NEGATIVE
Candida Vaginitis: NEGATIVE
Chlamydia: NEGATIVE
Comment: NEGATIVE
Comment: NEGATIVE
Comment: NEGATIVE
Comment: NEGATIVE
Comment: NEGATIVE
Comment: NORMAL
Neisseria Gonorrhea: NEGATIVE
Trichomonas: NEGATIVE

## 2020-07-17 MED ORDER — METRONIDAZOLE 500 MG PO TABS
500.0000 mg | ORAL_TABLET | Freq: Three times a day (TID) | ORAL | 0 refills | Status: AC
Start: 2020-07-17 — End: 2020-07-22

## 2020-07-17 MED FILL — metroNIDAZOLE 500 MG TABS: 500 | 5 days supply | Qty: 15 | Fill #0

## 2020-07-17 NOTE — Telephone Encounter (Signed)
Contacted pt to go over swab results pt is aware and doesn't have any questions or concerns

## 2020-07-30 NOTE — Progress Notes (Signed)
   S:  PCP: Dr. Joya Gaskins  No chief complaint on file.  Patient arrives in no acute distress.  Presents for diabetes evaluation, education, and management. Patient was referred and last seen by PCP on 07/16/20. At most recent visit with PCP, pt was non compliant with insulin. Basaglar increased.  Patient states her shifts changed at work 3 weeks ago. This has effected her insulin dosing and daily regimen. States she will have consistent shifts going forward. Reports unintentional weight loss due to decreased appetite and nausea related to pancreatitis.  Family/Social History:  - FHx: DM (mother, father); HD (mother, father); HTN (mother, father) - Tobacco: current 0.5 PPD smoker - Alcohol: denies current use   Insurance coverage/medication affordability: Self Pay   Current diabetes medications:  1. Basaglar 80 units daily - was taking 70 units but has not taken any for ~3 weeks 2. Novolog 10 units with meals (sliding scale: 0-150: 0 151-200: 2 units 201-250: 4 units 251-300: 6 units 301-350: 8 units 351- 400 10 units >400 12 units) - did not take any yesterday, sometimes forgets to take until after eating  Adverse effects: somes bruising, scabs in abdomen from injections (does rotate sites) Medication adherence: stressed out and forgetting to take insulin   Prior diabetes med trials: metformin (diarrhea), levemir, lantus, toujeo, humulin N, humulin R, saxagliptin (cost burden), sitagliptin  Hypoglycemia: - Denies any BG < 70  Lifestyle: Diet: decreased appetite, feeling nauseous. Eats 1 meal a day. - Lunch: P3, tuna sandwich or half cheese burger, small portions - Snacks: yogurts, nuts, cheeses - Drinks: water, Gatorade, limits soda  Exercise: walks a lot during work   O:  POCT BG today: 230  Lab Results  Component Value Date   HGBA1C 8.4 (A) 05/15/2020   There were no vitals filed for this visit.  Home BG Readings FBG: 240s-300s 2 hour post-meal/random: checking but does  not recall  Lipid Panel     Component Value Date/Time   CHOL 186 04/28/2020 1511   CHOL 120 03/17/2017 0000   TRIG 123 04/28/2020 1511   HDL 53 04/28/2020 1511   HDL 54 03/17/2017 0000   CHOLHDL 3.5 04/28/2020 1511   VLDL 25 04/28/2020 1511   LDLCALC 108 (H) 04/28/2020 1511   LDLCALC 30 03/17/2017 0000    Clinical Atherosclerotic Cardiovascular Disease (ASCVD): Yes  The ASCVD Risk score Mikey Bussing DC Jr., et al., 2013) failed to calculate for the following reasons:   The patient has a prior MI or stroke diagnosis    A: 1. Diabetes - Most recent A1c not at goal < 7%, next due 08/15/20 - Clinic and home BGs not at goal due to insulin non-adherence from changing work schedule and decrease in appetite - Reviewed Rule of 15s  P: - Restart Basaglar 45 units daily, increase by 2-3 units every 3 days until goal fasting BG <130 or max 70 units daily - Continue Novolog 10 units daily with meals and sliding scale (do not take if not eating full meal) - Provided number to [facility] to follow up on pancreatitis symptoms (has tried contacting her in 03/2020)  - F/U Pharmacist Clinic Visit in 2 weeks.  Total time in face to face counseling 40 minutes.    Fara Olden, PharmD PGY-1 Ambulatory Care Pharmacy Resident 07/31/2020 10:05 AM

## 2020-07-31 ENCOUNTER — Other Ambulatory Visit: Payer: Self-pay

## 2020-07-31 ENCOUNTER — Encounter: Payer: Self-pay | Admitting: Pharmacist

## 2020-07-31 ENCOUNTER — Ambulatory Visit: Payer: Medicaid Other | Attending: Critical Care Medicine | Admitting: Pharmacist

## 2020-07-31 DIAGNOSIS — E1165 Type 2 diabetes mellitus with hyperglycemia: Secondary | ICD-10-CM

## 2020-07-31 LAB — GLUCOSE, POCT (MANUAL RESULT ENTRY): POC Glucose: 230 mg/dl — AB (ref 70–99)

## 2020-08-13 NOTE — Progress Notes (Unsigned)
Plan: UACR, A1c,  A1C = 8.4 (05/15/20) Check Clinic BG Review medications and adherence (timing of meds, etc.)  Ate or drank anything prior to visit today? At home BGs?  [name] Highs  . Lows  Hyperglycemia sx (nocturia, neuropathy, visual changes, foot exams) Hypoglycemia symptoms (dizziness, shaky, sweating, hungry, confusion) Diet Exercise      S:    PCP: Dr. Joya Gaskins  Patient arrives ***.  Presents for diabetes evaluation, education, and management Patient was referred and last seen by Primary Care Provider on 07/16/20.   Patient reports Diabetes was diagnosed in ***.   Family/Social History: ***  Insurance coverage/medication affordability: ***  Medication adherence reported *** .   Current diabetes medications include: *** Current hypertension medications include: *** Current hyperlipidemia medications include: ***  Patient {Actions; denies-reports:120008} hypoglycemic events.  Patient reported dietary habits: Eats *** meals/day Breakfast:*** Lunch:*** Dinner:*** Snacks:*** Drinks:***  Patient-reported exercise habits: ***   Patient {Actions; denies-reports:120008} nocturia (nighttime urination).  Patient {Actions; denies-reports:120008} neuropathy (nerve pain). Patient {Actions; denies-reports:120008} visual changes. Patient {Actions; denies-reports:120008} self foot exams.     O:  Physical Exam   ROS   Lab Results  Component Value Date   HGBA1C 8.4 (A) 05/15/2020   There were no vitals filed for this visit.  Lipid Panel     Component Value Date/Time   CHOL 186 04/28/2020 1511   CHOL 120 03/17/2017 0000   TRIG 123 04/28/2020 1511   HDL 53 04/28/2020 1511   HDL 54 03/17/2017 0000   CHOLHDL 3.5 04/28/2020 1511   VLDL 25 04/28/2020 1511   LDLCALC 108 (H) 04/28/2020 1511   LDLCALC 30 03/17/2017 0000    Home fasting blood sugars: ***  2 hour post-meal/random blood sugars: ***.   Clinical Atherosclerotic Cardiovascular Disease (ASCVD):  {YES/NO:21197} The ASCVD Risk score Mikey Bussing DC Jr., et al., 2013) failed to calculate for the following reasons:   The patient has a prior MI or stroke diagnosis    A/P: Diabetes longstanding*** currently ***. Patient is *** able to verbalize appropriate hypoglycemia management plan. Medication adherence appears ***. Control is suboptimal due to ***. -{Meds adjust:18428} basal insulin *** (insulin ***). Patient will continue to titrate 1 unit every *** days if fasting blood sugar > 100mg /dl until fasting blood sugars reach goal or next visit.  -{Meds adjust:18428}  rapid insulin *** (insulin ***) to ***.  -{Meds adjust:18428} GLP-1 *** (generic name***) to ***.  -{Meds adjust:18428} SGLT2-I *** (generic name***) to ***. Counseled on sick day rules for ***. -Extensively discussed pathophysiology of diabetes, recommended lifestyle interventions, dietary effects on blood sugar control -Counseled on s/sx of and management of hypoglycemia -Next A1C anticipated ***.   ASCVD risk - primary***secondary prevention in patient with diabetes. Last LDL {Is/is not:9024} controlled. ASCVD risk score {Is/is not:9024} >20%  - {Desc; low/moderate/high:110033} intensity statin indicated. Aspirin {Is/is not:9024} indicated.  -{Meds adjust:18428} aspirin *** mg  -{Meds adjust:18428} ***statin *** mg.   Hypertension longstanding*** currently ***.  Blood pressure goal = *** mmHg. Medication adherence ***.  Blood pressure control is suboptimal due to ***. -***  Written patient instructions provided.  Total time in face to face counseling *** minutes.   Follow up Pharmacist/PCP*** Clinic Visit in ***.

## 2020-08-14 ENCOUNTER — Ambulatory Visit (HOSPITAL_COMMUNITY): Payer: Medicaid Other | Admitting: Clinical

## 2020-08-14 ENCOUNTER — Ambulatory Visit: Payer: Medicaid Other | Admitting: Pharmacist

## 2020-08-18 NOTE — Progress Notes (Unsigned)
Plan: UACR, A1c,  A1C = 8.4 (05/15/20) Check Clinic BG Review medications and adherence (timing of meds, etc.)  Ate or drank anything prior to visit today? At home BGs?  [name] Highs  . Lows  Hyperglycemia sx (nocturia, neuropathy, visual changes, foot exams) Hypoglycemia symptoms (dizziness, shaky, sweating, hungry, confusion) Diet Exercise      S:    PCP: Dr. Joya Gaskins  Patient arrives ***.  Presents for diabetes evaluation, education, and management Patient was referred and last seen by Primary Care Provider on 07/16/20.   Patient reports Diabetes was diagnosed in ***.   Family/Social History: ***  Insurance coverage/medication affordability: ***  Medication adherence reported *** .   Current diabetes medications include: *** Current hypertension medications include: *** Current hyperlipidemia medications include: ***  Patient {Actions; denies-reports:120008} hypoglycemic events.  Patient reported dietary habits: Eats *** meals/day Breakfast:*** Lunch:*** Dinner:*** Snacks:*** Drinks:***  Patient-reported exercise habits: ***   Patient {Actions; denies-reports:120008} nocturia (nighttime urination).  Patient {Actions; denies-reports:120008} neuropathy (nerve pain). Patient {Actions; denies-reports:120008} visual changes. Patient {Actions; denies-reports:120008} self foot exams.     O:    Lab Results  Component Value Date   HGBA1C 8.4 (A) 05/15/2020   There were no vitals filed for this visit.  Lipid Panel     Component Value Date/Time   CHOL 186 04/28/2020 1511   CHOL 120 03/17/2017 0000   TRIG 123 04/28/2020 1511   HDL 53 04/28/2020 1511   HDL 54 03/17/2017 0000   CHOLHDL 3.5 04/28/2020 1511   VLDL 25 04/28/2020 1511   LDLCALC 108 (H) 04/28/2020 1511   LDLCALC 30 03/17/2017 0000    Home fasting blood sugars: ***  2 hour post-meal/random blood sugars: ***.   Clinical Atherosclerotic Cardiovascular Disease (ASCVD): {YES/NO:21197} The ASCVD Risk  score Mikey Bussing DC Jr., et al., 2013) failed to calculate for the following reasons:   The patient has a prior MI or stroke diagnosis    A/P: Diabetes longstanding*** currently ***. Patient is *** able to verbalize appropriate hypoglycemia management plan. Medication adherence appears ***. Control is suboptimal due to ***. -{Meds adjust:18428} basal insulin *** (insulin ***). Patient will continue to titrate 1 unit every *** days if fasting blood sugar > 100mg /dl until fasting blood sugars reach goal or next visit.  -{Meds adjust:18428}  rapid insulin *** (insulin ***) to ***.  -{Meds adjust:18428} GLP-1 *** (generic name***) to ***.  -{Meds adjust:18428} SGLT2-I *** (generic name***) to ***. Counseled on sick day rules for ***. -Extensively discussed pathophysiology of diabetes, recommended lifestyle interventions, dietary effects on blood sugar control -Counseled on s/sx of and management of hypoglycemia -Next A1C anticipated ***.   ASCVD risk - primary***secondary prevention in patient with diabetes. Last LDL {Is/is not:9024} controlled. ASCVD risk score {Is/is not:9024} >20%  - {Desc; low/moderate/high:110033} intensity statin indicated. Aspirin {Is/is not:9024} indicated.  -{Meds adjust:18428} aspirin *** mg  -{Meds adjust:18428} ***statin *** mg.   Hypertension longstanding*** currently ***.  Blood pressure goal = *** mmHg. Medication adherence ***.  Blood pressure control is suboptimal due to ***. -***  Written patient instructions provided.  Total time in face to face counseling *** minutes.   Follow up Pharmacist/PCP*** Clinic Visit in ***.

## 2020-08-19 ENCOUNTER — Ambulatory Visit: Payer: Medicaid Other | Admitting: Pharmacist

## 2020-08-29 ENCOUNTER — Other Ambulatory Visit: Payer: Self-pay

## 2020-08-29 ENCOUNTER — Encounter (HOSPITAL_COMMUNITY): Payer: Self-pay

## 2020-08-29 ENCOUNTER — Ambulatory Visit (HOSPITAL_COMMUNITY)
Admission: EM | Admit: 2020-08-29 | Discharge: 2020-08-29 | Disposition: A | Discharge status: Home / Self Care | Payer: Medicaid Other | Attending: Internal Medicine | Admitting: Internal Medicine

## 2020-08-29 DIAGNOSIS — M545 Low back pain, unspecified: Secondary | ICD-10-CM

## 2020-08-29 DIAGNOSIS — I110 Hypertensive heart disease with heart failure: Secondary | ICD-10-CM | POA: Diagnosis not present

## 2020-08-29 DIAGNOSIS — Z87891 Personal history of nicotine dependence: Secondary | ICD-10-CM | POA: Diagnosis not present

## 2020-08-29 DIAGNOSIS — E1142 Type 2 diabetes mellitus with diabetic polyneuropathy: Secondary | ICD-10-CM | POA: Diagnosis not present

## 2020-08-29 DIAGNOSIS — Z79899 Other long term (current) drug therapy: Secondary | ICD-10-CM | POA: Diagnosis not present

## 2020-08-29 DIAGNOSIS — Z3202 Encounter for pregnancy test, result negative: Secondary | ICD-10-CM

## 2020-08-29 DIAGNOSIS — Z87440 Personal history of urinary (tract) infections: Secondary | ICD-10-CM | POA: Diagnosis not present

## 2020-08-29 DIAGNOSIS — E7800 Pure hypercholesterolemia, unspecified: Secondary | ICD-10-CM | POA: Insufficient documentation

## 2020-08-29 DIAGNOSIS — E559 Vitamin D deficiency, unspecified: Secondary | ICD-10-CM | POA: Diagnosis not present

## 2020-08-29 DIAGNOSIS — G8929 Other chronic pain: Secondary | ICD-10-CM | POA: Insufficient documentation

## 2020-08-29 DIAGNOSIS — Z794 Long term (current) use of insulin: Secondary | ICD-10-CM | POA: Diagnosis not present

## 2020-08-29 DIAGNOSIS — Z882 Allergy status to sulfonamides status: Secondary | ICD-10-CM | POA: Insufficient documentation

## 2020-08-29 DIAGNOSIS — I5042 Chronic combined systolic (congestive) and diastolic (congestive) heart failure: Secondary | ICD-10-CM | POA: Insufficient documentation

## 2020-08-29 DIAGNOSIS — F319 Bipolar disorder, unspecified: Secondary | ICD-10-CM | POA: Diagnosis not present

## 2020-08-29 LAB — POCT URINALYSIS DIPSTICK, ED / UC
Bilirubin Urine: NEGATIVE
Glucose, UA: NEGATIVE mg/dL
Ketones, ur: NEGATIVE mg/dL
Nitrite: NEGATIVE
Protein, ur: >=300 mg/dL — AB
Specific Gravity, Urine: >=1.03 (ref 1.005–1.030)
Urobilinogen, UA: 0.2 mg/dL (ref 0.0–1.0)
pH: 5.5 (ref 5.0–8.0)

## 2020-08-29 LAB — POC URINE PREG, ED: Preg Test, Ur: NEGATIVE

## 2020-08-29 MED ORDER — DICLOFENAC SODIUM 1 % EX GEL: 4.0000 g | Freq: Four times a day (QID) | CUTANEOUS | 0 refills | Status: DC

## 2020-08-29 MED ORDER — METHOCARBAMOL 500 MG PO TABS: 500.0000 mg | ORAL_TABLET | Freq: Every day | ORAL | 0 refills | Status: AC

## 2020-08-29 MED FILL — DICLOFENAC SODIUM 1% GEL: 1 | 6 days supply | Qty: 100 | Fill #0

## 2020-08-29 MED FILL — METHOCARBAMOL 500 MG TABS: 500 | 10 days supply | Qty: 10 | Fill #0

## 2020-08-29 NOTE — ED Triage Notes (Signed)
Pt presents with recurring back pain X 2 months.

## 2020-08-29 NOTE — ED Provider Notes (Signed)
[place]    CSN: [id number] Arrival date & time: 08/29/20  1140      History   Chief Complaint Chief Complaint  Patient presents with  . Back Pain    HPI Sarah Long is a 49 y.o. female.   Patient reports for right-sided low back pain.  She reports this been an issue on and off for the last 6 weeks to 2 months.  She reports over the last several days and a little more pain.  She wonders if this could be related to urinary tract infections or vaginal infections.  She reports she has had vague discomfort like this in the past.  She reports pain with movement in the left lower back.  Reports occasionally pain with touch that area as well.  She denies fevers or chills.  She denies painful urination.  Endorses slight increased in her frequency, however denies urgency.  She denies any vaginal discharge or irritation.  No abdominal pain.  Denies any numbness, tingling or weakness in her legs.  Patient does report a recent menstrual period.  She reports the vague pain started about a month ago she did have a vaginal swab done by her primary care provider and this was negative.  She has a taken Tylenol with some relief.     Past Medical History:  Diagnosis Date  . Abnormal Pap smear 1998  . Abnormal vaginal bleeding 12/19/2011  . Bacterial infection   . Bipolar 1 disorder ([place])   . Blister of second toe of left foot 11/21/2016  . Candida vaginitis 07/2007  . Depression    recently added wellbutrin-has not taken yet for bipolar  . Diabetes in pregnancy   . Diabetes mellitus    nph 20U qam and qpm, regular with meals  . Diabetic ketoacidosis without coma associated with type 2 diabetes mellitus ([place])   . Fibroid   . Galactorrhea of right breast 2008  . H/O amenorrhea 07/2007  . H/O dizziness 10/14/11  . H/O dysmenorrhea 2010  . H/O menorrhagia 10/14/11  . H/O varicella   . Headache(784.0)   . Heavy vaginal bleeding due to contraceptive injection use 10/12/2011   Depo  Provera  . Herpes   . HSV-2 infection 01/03/2009  . Hx: UTI (urinary tract infection) 2009  . Hypertension    on aldomet  . Increased BMI 2010  . Irregular uterine bleeding 04/04/2012   Pt has mirena   . Obesity 10/14/11  . Oligomenorrhea 07/2007  . Pelvic pain in female   . Preterm labor   . Trichomonas   . Yeast infection     Patient Active Problem List   Diagnosis Date Noted  . Bacterial vaginosis 07/17/2020  . Vaginal discharge 07/16/2020  . Dental caries 04/01/2020  . Cracked tooth 04/01/2020  . Alcohol use 03/11/2020  . Vitamin D deficiency 03/11/2020  . Prolonged QT interval 02/26/2020  . Chronic combined systolic and diastolic CHF (congestive heart failure) ([place]) 02/26/2020  . Heart block AV complete ([place]) 02/11/2020  . Acute pancreatitis without infection or necrosis   . Dysmenorrhea 10/02/2019  . DM type 2 with diabetic peripheral neuropathy ([place]) 05/30/2019  . 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 block) 05/29/2019  . Bilateral bunions 05/29/2019  . Diabetes type 2, uncontrolled ([place]) 10/21/2016  . Smoking 11/26/2013  . Hypercholesteremia 02/02/2007  . Bipolar 1 disorder ([place]) 02/02/2007  . HYPERTENSION, BENIGN SYSTEMIC 02/02/2007    Past Surgical History:  Procedure Laterality Date  .  CESAREAN SECTION  1991  . LEFT HEART CATH AND CORONARY ANGIOGRAPHY N/A 02/10/2020   Procedure: LEFT HEART CATH AND CORONARY ANGIOGRAPHY;  Surgeon: Troy Sine, MD;  Location: [facility];  Service: Cardiovascular;  Laterality: N/A;  . PACEMAKER IMPLANT N/A 02/13/2020   Procedure: PACEMAKER IMPLANT;  Surgeon: Constance Haw, MD;  Location: [facility];  Service: Cardiovascular;  Laterality: N/A;  . TEMPORARY PACEMAKER N/A 02/10/2020   Procedure: TEMPORARY PACEMAKER;  Surgeon: Troy Sine, MD;  Location: [facility];  Service: Cardiovascular;  Laterality: N/A;    OB History    Gravida  6   Para  4   Term  1   Preterm    3   AB  2   Living  4     SAB  1   TAB  1   Ectopic  0   Multiple  0   Live Births  3            Home Medications    Prior to Admission medications   Medication Sig Start Date End Date Taking? Authorizing Provider  Accu-Chek Softclix Lancets lancets Use as instructed to check blood sugar three times daily. E11.65 03/20/20   [name], MD  Blood Glucose Monitoring Suppl (ACCU-CHEK GUIDE ME) w/Device KIT 1 kit by Does not apply route in the morning, at noon, and at bedtime. Use as instructed to check blood sugar three times daily. E11.65 03/20/20   [name], MD  diclofenac Sodium (VOLTAREN) 1 % GEL Apply 4 Sarah topically 4 (four) times daily. 08/29/20   Bogdan Vivona, Marguerita Beards, PA-C  diphenhydrAMINE (BENADRYL) 25 mg capsule Take 25 mg by mouth every 6 (six) hours as needed for allergies.    [provider]  glucose blood (ACCU-CHEK GUIDE) test strip Use as instructed to check blood sugar three times daily. E11.65 03/20/20   [name], MD  insulin aspart (NOVOLOG FLEXPEN) 100 UNIT/ML FlexPen 10 unit with meals Sliding scale three times daily: 0-150: 0 151-200: 2 units  201-250: 4 units  251-300: 6 units  301-350: 8 units  351- 400 10 units   >400  12 units 07/16/20   [name], MD  Insulin Glargine (BASAGLAR KWIKPEN) 100 UNIT/ML Inject 0.8 mLs (80 Units total) into the skin daily. 07/16/20 08/25/20  [name], MD  Insulin Pen Needle (B-D ULTRAFINE III SHORT PEN) 31G X 8 MM MISC 1 application by Does not apply route daily. 07/16/20   [name], MD  Lancet Devices (ADJUSTABLE LANCING DEVICE) MISC Inject 1 each into the skin 3 (three) times daily. 10/17/18   Clent Demark, PA-C  LATUDA 40 MG TABS tablet SMARTSIG:1 Tablet(s) By Mouth Every Evening 04/15/20   [provider]  methocarbamol (ROBAXIN) 500 MG tablet Take 1 tablet (500 mg total) by mouth at bedtime for 10 days. 08/29/20 09/08/20  Laveyah Oriol, Marguerita Beards, PA-C  ondansetron (ZOFRAN) 4 MG  tablet Take 1 tablet (4 mg total) by mouth every 8 (eight) hours as needed for nausea or vomiting. 05/13/20   [name], MD  Pancrelipase, Lip-Prot-Amyl, 24000-76000 units CPEP Take 1 capsule (24,000 Units total) by mouth 3 (three) times daily with meals. 07/16/20   [name], MD  rosuvastatin (CRESTOR) 10 MG tablet Take 1 tablet (10 mg total) by mouth daily. 04/29/20 07/28/20  Larey Dresser, MD  sacubitril-valsartan (ENTRESTO) 49-51 MG Take 1 tablet by mouth 2 (two)  times daily. 05/22/20   Larey Dresser, MD  thiamine 100 MG tablet Take 1 tablet (100 mg total) by mouth daily. 07/16/20   [name], MD  varenicline (CHANTIX STARTING MONTH PAK) 0.5 MG X 11 & 1 MG X 42 tablet Take one 0.5 mg tablet by mouth once daily for 3 days, then increase to one 0.5 mg tablet twice daily for 4 days, then increase to one 1 mg tablet twice daily. 05/22/20   Larey Dresser, MD    Family History Family History  Problem Relation Age of Onset  . Hypertension Mother   . Diabetes Mother   . Heart disease Mother   . Hypertension Father   . Diabetes Father   . Heart disease Father   . Stroke Maternal Grandfather   . Other Neg Hx     Social History Social History   Tobacco Use  . Smoking status: Former Smoker    Packs/day: 0.50    Years: 20.00    Pack years: 10.00    Types: Cigarettes  . Smokeless tobacco: Never Used  Vaping Use  . Vaping Use: Never used  Substance Use Topics  . Alcohol use: No  . Drug use: No     Allergies   Strawberry extract and Sulfa antibiotics   Review of Systems Review of Systems   Physical Exam Triage Vital Signs ED Triage Vitals [08/29/20 1337]  Enc Vitals Group     BP (!) 155/93     Pulse Rate 84     Resp 18     Temp 98 F (36.7 C)     Temp Source Oral     SpO2 100 %     Weight      Height      Head Circumference      Peak Flow      Pain Score 8     Pain Loc      Pain Edu?      Excl. in [place]?    No data found.  Updated Vital  Signs BP (!) 155/93 (BP Location: Right Arm)   Pulse 84   Temp 98 F (36.7 C) (Oral)   Resp 18   LMP  (LMP Unknown)   SpO2 100%   Visual Acuity Right Eye Distance:   Left Eye Distance:   Bilateral Distance:    Right Eye Near:   Left Eye Near:    Bilateral Near:     Physical Exam Vitals and nursing note reviewed.  Constitutional:      General: She is not in acute distress.    Appearance: Normal appearance. She is well-developed. She is not ill-appearing.  HENT:     Head: Normocephalic and atraumatic.  Eyes:     Conjunctiva/sclera: Conjunctivae normal.  Cardiovascular:     Rate and Rhythm: Normal rate and regular rhythm.     Heart sounds: No murmur heard.   Pulmonary:     Effort: Pulmonary effort is normal. No respiratory distress.     Breath sounds: Normal breath sounds.  Abdominal:     Palpations: Abdomen is soft.     Tenderness: There is no abdominal tenderness. There is no right CVA tenderness or left CVA tenderness.  Musculoskeletal:     Cervical back: Neck supple.     Right lower leg: No edema.     Left lower leg: No edema.     Comments: There is mild tenderness over the right upper lumbar spinal musculature, reproducible to doubt  the patient reports pain.  There is also elicited pain with movement.  There is no midline spinal tenderness.  Patient has full range of motion of the spinal column.  No overlying rash.  Strength 5/5 in the lower extremities.  Sensation grossly intact.  Patient is amatory without issue.  Skin:    General: Skin is warm and dry.     Capillary Refill: Capillary refill takes less than 2 seconds.  Neurological:     General: No focal deficit present.     Mental Status: She is alert and oriented to person, place, and time.      UC Treatments / Results  Labs (all labs ordered are listed, but only abnormal results are displayed) Labs Reviewed  POCT URINALYSIS DIPSTICK, ED / UC - Abnormal; Notable for the following components:      Result  Value   Hgb urine dipstick MODERATE (*)    Protein, ur >=300 (*)    Leukocytes,Ua SMALL (*)    All other components within normal limits  URINE CULTURE  POC URINE PREG, ED    EKG   Radiology No results found.  Procedures Procedures (including critical care time)  Medications Ordered in UC Medications - No data to display  Initial Impression / Assessment and Plan / UC Course  I have reviewed the triage vital signs and the nursing notes.  Pertinent labs & imaging results that were available during my care of the patient were reviewed by me and considered in my medical decision making (see chart for details).     #Chronic right-sided low back pain Patient is a 49 year old presenting with appears to be musculoskeletal back pain.  Very reproducible on exam.  Patient with small leukocytes on UA and moderate blood, however clinical history does not support infection at this time.  Patient is a diabetic and at baseline has increased urinary frequency per her report.  We will culture her urine prior to initiation of any antibiotics.  There are no red flags today indicating imaging.  We will place her on Robaxin, recommend continue Tylenol and use topical Voltaren.  Discussed return, follow-up and emergency room precautions.  Patient verbalized agreement and understanding plan of care Final Clinical Impressions(s) / UC Diagnoses   Final diagnoses:  Chronic right-sided low back pain, unspecified whether sciatica present     Discharge Instructions     Take the Robaxin at night, do not drive drink alcohol or operate machiner within 8 hours  May continue Tylenol  Apply the Voltaren as needed follow-up with your primary care  Apply heat and rest.  Do light stretching.  We have cultured your urine and will call you if we need to add any treatments      ED Prescriptions    Medication Sig Dispense Auth. Provider   diclofenac Sodium (VOLTAREN) 1 % GEL Apply 4 Sarah topically 4 (four)  times daily. 100 Sarah Brynnly Bonet, Marguerita Beards, PA-C   methocarbamol (ROBAXIN) 500 MG tablet Take 1 tablet (500 mg total) by mouth at bedtime for 10 days. 10 tablet Kasheena Sambrano, Marguerita Beards, PA-C     PDMP not reviewed this encounter.   Purnell Shoemaker, PA-C 08/29/20 1443

## 2020-08-29 NOTE — Discharge Instructions (Addendum)
Take the Robaxin at night, do not drive drink alcohol or operate machiner within 8 hours  May continue Tylenol  Apply the Voltaren as needed follow-up with your primary care  Apply heat and rest.  Do light stretching.  We have cultured your urine and will call you if we need to add any treatments

## 2020-09-01 LAB — URINE CULTURE: Culture: >=100000 — AB

## 2020-09-02 ENCOUNTER — Ambulatory Visit (HOSPITAL_COMMUNITY): Payer: Medicaid Other | Admitting: Psychiatry

## 2020-09-02 ENCOUNTER — Telehealth (HOSPITAL_COMMUNITY): Payer: Self-pay | Admitting: Emergency Medicine

## 2020-09-02 MED ORDER — NITROFURANTOIN MONOHYD MACRO 100 MG PO CAPS
100.0000 mg | ORAL_CAPSULE | Freq: Two times a day (BID) | ORAL | 0 refills | Status: DC
Start: 2020-09-02 — End: 2020-09-02

## 2020-09-02 MED ORDER — NITROFURANTOIN MONOHYD MACRO 100 MG PO CAPS: 100.0000 mg | ORAL_CAPSULE | Freq: Two times a day (BID) | ORAL | 0 refills | Status: DC

## 2020-09-02 NOTE — Telephone Encounter (Signed)
Change in pharmacy

## 2020-09-09 ENCOUNTER — Encounter: Payer: Self-pay | Admitting: Advanced Practice Midwife

## 2020-09-09 ENCOUNTER — Other Ambulatory Visit (HOSPITAL_COMMUNITY)
Admission: RE | Admit: 2020-09-09 | Discharge: 2020-09-09 | Disposition: A | Discharge status: Home / Self Care | Payer: Medicaid Other | Source: Ambulatory Visit | Attending: Advanced Practice Midwife | Admitting: Advanced Practice Midwife

## 2020-09-09 ENCOUNTER — Other Ambulatory Visit: Payer: Self-pay

## 2020-09-09 ENCOUNTER — Ambulatory Visit (INDEPENDENT_AMBULATORY_CARE_PROVIDER_SITE_OTHER): Payer: Medicaid Other | Admitting: Advanced Practice Midwife

## 2020-09-09 VITALS — BP 159/92 | HR 85 | Wt 198.0 lb

## 2020-09-09 DIAGNOSIS — Z01419 Encounter for gynecological examination (general) (routine) without abnormal findings: Secondary | ICD-10-CM | POA: Diagnosis not present

## 2020-09-09 DIAGNOSIS — N898 Other specified noninflammatory disorders of vagina: Secondary | ICD-10-CM

## 2020-09-09 DIAGNOSIS — Z0000 Encounter for general adult medical examination without abnormal findings: Secondary | ICD-10-CM

## 2020-09-09 DIAGNOSIS — R300 Dysuria: Secondary | ICD-10-CM | POA: Diagnosis not present

## 2020-09-09 NOTE — Progress Notes (Signed)
GYNECOLOGY ANNUAL PREVENTATIVE CARE ENCOUNTER NOTE  Subjective:   Sarah Long is a 49 y.o. [phone] female here for a routine annual gynecologic exam.  Current complaints: had some vaginal discharge. Seems like it may be improving.   Denies abnormal vaginal bleeding, pelvic pain, problems with intercourse or other gynecologic concerns.   Patient is diabetic and on insulin. She thinks that her discharge is related to her blood sugars.   Patient has tried Mirena, depo injections, OCPs for birth control. She has gotten pregnant with each of these methods.    Gynecologic History No LMP recorded (lmp unknown). Patient is premenopausal. LMP 08/22/2020, regular  Contraception: condoms Last Pap: 2017. Results were: normal Last mammogram: 2017. Results were: normal  Obstetric History OB History  Gravida Para Term Preterm AB Living  _0 SAB TAB Ectopic Multiple Live Births  1 1 0 0 3    # Outcome Date GA Lbr Len/2nd Weight Sex Delivery Anes PTL Lv  6 Term 07/21/11 10w0d01:25 / 00:06 8 lb 9 oz (3.885 kg) M VBAC Local  LIV     Birth Comments: Caput, facial bruising   5 Preterm 06/2009 361w6d F Vag-Spont   LIV  4 Preterm 12/2005 2982w3dM Vag-Spont     3 Preterm 09/1990 26w56w0d CS-Unspec   LIV  2 TAB 1988 16w063w0d    1 SAB             Past Medical History:  Diagnosis Date  . Abnormal Pap smear 1998  . Abnormal vaginal bleeding 12/19/2011  . Bacterial infection   . Bipolar 1 disorder ([facility]) [place] Blister of second toe of left foot 11/21/2016  . Candida vaginitis 07/2007  . Depression    recently added wellbutrin-has not taken yet for bipolar  . Diabetes in pregnancy   . Diabetes mellitus    nph 20U qam and qpm, regular with meals  . Diabetic ketoacidosis without coma associated with type 2 diabetes mellitus ([facility]) [place] Fibroid   . Galactorrhea of right breast 2008  . H/O amenorrhea 07/2007  . H/O dizziness 10/14/11  . H/O dysmenorrhea 2010  . H/O menorrhagia 10/14/11  . H/O  varicella   . Headache(784.0)   . Heavy vaginal bleeding due to contraceptive injection use 10/12/2011   Depo Provera  . Herpes   . HSV-2 infection 01/03/2009  . Hx: UTI (urinary tract infection) 2009  . Hypertension    on aldomet  . Increased BMI 2010  . Irregular uterine bleeding 04/04/2012   Pt has mirena   . Obesity 10/14/11  . Oligomenorrhea 07/2007  . Pelvic pain in female   . Preterm labor   . Trichomonas   . Yeast infection     Past Surgical History:  Procedure Laterality Date  . CESAREAN SECTION  1991  . LEFT HEART CATH AND CORONARY ANGIOGRAPHY N/A 02/10/2020   Procedure: LEFT HEART CATH AND CORONARY ANGIOGRAPHY;  Surgeon: KellyTroy Sine  [facility];  Service: Cardiovascular;  Laterality: N/A;  . PACEMAKER IMPLANT N/A 02/13/2020   Procedure: PACEMAKER IMPLANT;  Surgeon: CamniConstance Haw  [facility];  Service: Cardiovascular;  Laterality: N/A;  . TEMPORARY PACEMAKER N/A 02/10/2020   Procedure: TEMPORARY PACEMAKER;  Surgeon: KellyTroy Sine  [facility];  Service: Cardiovascular;  Laterality: N/A;    Current  Outpatient Medications on File Prior to Visit  Medication Sig Dispense Refill  . Accu-Chek Softclix Lancets lancets Use as instructed to check blood sugar three times daily. E11.65 100 each 6  . Blood Glucose Monitoring Suppl (ACCU-CHEK GUIDE ME) w/Device KIT 1 kit by Does not apply route in the morning, at noon, and at bedtime. Use as instructed to check blood sugar three times daily. E11.65 1 kit 0  . diclofenac Sodium (VOLTAREN) 1 % GEL Apply 4 g topically 4 (four) times daily. 100 g 0  . diphenhydrAMINE (BENADRYL) 25 mg capsule Take 25 mg by mouth every 6 (six) hours as needed for allergies.    [name] glucose blood (ACCU-CHEK GUIDE) test strip Use as instructed to check blood sugar three times daily. E11.65 100 each 6  . insulin aspart (NOVOLOG FLEXPEN) 100 UNIT/ML FlexPen 10 unit with meals Sliding scale  three times daily: 0-150: 0 151-200: 2 units  201-250: 4 units  251-300: 6 units  301-350: 8 units  351- 400 10 units   >400  12 units 15 mL 11  . Insulin Glargine (BASAGLAR KWIKPEN) 100 UNIT/ML Inject 0.8 mLs (80 Units total) into the skin daily. 32 mL 4  . Insulin Pen Needle (B-D ULTRAFINE III SHORT PEN) 31G X 8 MM MISC 1 application by Does not apply route daily. 90 each 3  . Lancet Devices (ADJUSTABLE LANCING DEVICE) MISC Inject 1 each into the skin 3 (three) times daily. 100 each 5  . LATUDA 40 MG TABS tablet SMARTSIG:1 Tablet(s) By Mouth Every Evening    . nitrofurantoin, macrocrystal-monohydrate, (MACROBID) 100 MG capsule Take 1 capsule (100 mg total) by mouth 2 (two) times daily. 10 capsule 0  . ondansetron (ZOFRAN) 4 MG tablet Take 1 tablet (4 mg total) by mouth every 8 (eight) hours as needed for nausea or vomiting. 30 tablet 0  . Pancrelipase, Lip-Prot-Amyl, 24000-76000 units CPEP Take 1 capsule (24,000 Units total) by mouth 3 (three) times daily with meals. 270 capsule 1  . rosuvastatin (CRESTOR) 10 MG tablet Take 1 tablet (10 mg total) by mouth daily. 90 tablet 3  . sacubitril-valsartan (ENTRESTO) 49-51 MG Take 1 tablet by mouth 2 (two) times daily. 60 tablet 11  . thiamine 100 MG tablet Take 1 tablet (100 mg total) by mouth daily. 60 tablet 1  . varenicline (CHANTIX STARTING MONTH PAK) 0.5 MG X 11 & 1 MG X 42 tablet Take one 0.5 mg tablet by mouth once daily for 3 days, then increase to one 0.5 mg tablet twice daily for 4 days, then increase to one 1 mg tablet twice daily. 53 tablet 5   No current facility-administered medications on file prior to visit.    Allergies  Allergen Reactions  . Strawberry Extract Hives  . Sulfa Antibiotics Hives and Itching    Social History   Socioeconomic History  . Marital status: Married    Spouse name: Not on file  . Number of children: Not on file  . Years of education: Not on file  . Highest education level: Not on file  Occupational  History  . Not on file  Tobacco Use  . Smoking status: Former Smoker    Packs/day: 0.50    Years: 20.00    Pack years: 10.00    Types: Cigarettes  . Smokeless tobacco: Never Used  Vaping Use  . Vaping Use: Never used  Substance and Sexual Activity  . Alcohol use: No  . Drug use: No  . Sexual activity:  Yes    Birth control/protection: None  Other Topics Concern  . Not on file  Social History Narrative  . Not on file   Social Determinants of Health   Financial Resource Strain:   . Difficulty of Paying Living Expenses: Not on file  Food Insecurity:   . Worried About Charity fundraiser in the Last Year: Not on file  . Ran Out of Food in the Last Year: Not on file  Transportation Needs:   . Lack of Transportation (Medical): Not on file  . Lack of Transportation (Non-Medical): Not on file  Physical Activity:   . Days of Exercise per Week: Not on file  . Minutes of Exercise per Session: Not on file  Stress:   . Feeling of Stress : Not on file  Social Connections:   . Frequency of Communication with Friends and Family: Not on file  . Frequency of Social Gatherings with Friends and Family: Not on file  . Attends Religious Services: Not on file  . Active Member of Clubs or Organizations: Not on file  . Attends Archivist Meetings: Not on file  . Marital Status: Not on file  Intimate Partner Violence:   . Fear of Current or Ex-Partner: Not on file  . Emotionally Abused: Not on file  . Physically Abused: Not on file  . Sexually Abused: Not on file    Family History  Problem Relation Age of Onset  . Hypertension Mother   . Diabetes Mother   . Heart disease Mother   . Hypertension Father   . Diabetes Father   . Heart disease Father   . Stroke Maternal Grandfather   . Other Neg Hx     The following portions of the patient's history were reviewed and updated as appropriate: allergies, current medications, past family history, past medical history, past social  history, past surgical history and problem list.  Review of Systems Pertinent items noted in HPI and remainder of comprehensive ROS otherwise negative.   Objective:  BP (!) 159/92   Pulse 85   Wt 198 lb (89.8 kg)   LMP  (LMP Unknown)   BMI 33.99 kg/m  Physical Exam Vitals and nursing note reviewed. Exam conducted with a chaperone present.  Constitutional:      Appearance: Normal appearance.  HENT:     Head: Normocephalic.  Cardiovascular:     Rate and Rhythm: Normal rate.     Heart sounds: Normal heart sounds.  Pulmonary:     Effort: Pulmonary effort is normal.     Breath sounds: Normal breath sounds.  Abdominal:     Palpations: Abdomen is soft.     Tenderness: There is no abdominal tenderness.  Genitourinary:    Comments: External: no lesion Vagina: small amount of white discharge Cervix: pink, smooth, no CMT Uterus: NSSC Adnexa: NT  Skin:    General: Skin is warm and dry.  Neurological:     Mental Status: She is alert and oriented to person, place, and time.  Psychiatric:        Mood and Affect: Mood normal.        Behavior: Behavior normal.       Assessment and Plan:  1. Women's annual routine gynecological examination - Cytology - PAP( [facility]) - MM DIGITAL SCREENING BILATERAL; Future  2. Vaginal discharge - Cervicovaginal ancillary only( [facility])  3. Dysuria - Urine culture  - Patient had a UTI at UC last week. She was only able  to take a couple of doses of the antibiotic she was given as it make her feel bad when she took it. She reports that it caused GI upset.   Will follow up results of pap smear and manage accordingly. Patient will call to schedule her mammogram  Routine preventative health maintenance measures emphasized. Please refer to After Visit Summary for other counseling recommendations.   Marcille Buffy DNP, CNM  09/09/20  3:01 PM

## 2020-09-09 NOTE — Patient Instructions (Signed)
Mammogram °A mammogram is an X-ray of the breasts that is done to check for changes that are not normal. This test can screen for and find any changes that may suggest breast cancer. Mammograms are regularly done on women. A man may have a mammogram if he has a lump or swelling in his breast. This test can also help to find other changes and variations in the breast. °Tell a doctor: °· About any allergies you have. °· If you have breast implants. °· If you have had previous breast disease, biopsy, or surgery. °· If you are breastfeeding. °· If you are younger than age 25. °· If you have a family history of breast cancer. °· Whether you are pregnant or may be pregnant. °What are the risks? °Generally, this is a safe procedure. However, problems may occur, including: °· Exposure to radiation. Radiation levels are very low with this test. °· The results being misinterpreted. °· The need for further tests. °· The inability of the mammogram to detect certain cancers. °What happens before the procedure? °· Have this test done about 1-2 weeks after your period. This is usually when your breasts are the least tender. °· If you are visiting a new doctor or clinic, send any past mammogram images to your new doctor's office. °· Wash your breasts and under your arms the day of the test. °· Do not use deodorants, perfumes, lotions, or powders on the day of the test. °· Take off any jewelry from your neck. °· Wear clothes that you can change into and out of easily. °What happens during the procedure? ° °· You will undress from the waist up. You will put on a gown. °· You will stand in front of the X-ray machine. °· Each breast will be placed between two plastic or glass plates. The plates will press down on your breast for a few seconds. Try to stay as relaxed as possible. This does not cause any harm to your breasts. Any discomfort you feel will be very brief. °· X-rays will be taken from different angles of each breast. °The  procedure may vary among doctors and hospitals. °What happens after the procedure? °· The mammogram will be read by a specialist (radiologist). °· You may need to do certain parts of the test again. This depends on the quality of the images. °· Ask when your test results will be ready. Make sure you get your test results. °· You may go back to your normal activities. °Summary °· A mammogram is a low energy X-ray of the breasts that is done to check for abnormal changes. A man may have this test if he has a lump or swelling in his breast. °· Before the procedure, tell your doctor about any breast problems that you have had in the past. °· Have this test done about 1-2 weeks after your period. °· For the test, each breast will be placed between two plastic or glass plates. The plates will press down on your breast for a few seconds. °· The mammogram will be read by a specialist (radiologist). Ask when your test results will be ready. Make sure you get your test results. °This information is not intended to replace advice given to you by your health care provider. Make sure you discuss any questions you have with your health care provider. °Document Revised: 07/13/2018 Document Reviewed: 07/13/2018 °[employer] Patient Education © 2020 [employer] ° °

## 2020-09-09 NOTE — Addendum Note (Signed)
Addended by: Louisa Second E on: 09/09/2020 03:51 PM   Modules accepted: Orders

## 2020-09-10 LAB — CERVICOVAGINAL ANCILLARY ONLY
Bacterial Vaginitis (gardnerella): POSITIVE — AB
Candida Glabrata: NEGATIVE
Candida Vaginitis: NEGATIVE
Chlamydia: NEGATIVE
Comment: NEGATIVE
Comment: NEGATIVE
Comment: NEGATIVE
Comment: NEGATIVE
Comment: NEGATIVE
Comment: NORMAL
Neisseria Gonorrhea: NEGATIVE
Trichomonas: NEGATIVE

## 2020-09-11 ENCOUNTER — Telehealth: Payer: Self-pay | Admitting: General Practice

## 2020-09-11 ENCOUNTER — Other Ambulatory Visit: Payer: Self-pay | Admitting: Advanced Practice Midwife

## 2020-09-11 LAB — CYTOLOGY - PAP
Adequacy: ABSENT
Comment: NEGATIVE
Diagnosis: NEGATIVE
High risk HPV: NEGATIVE

## 2020-09-11 MED ORDER — METRONIDAZOLE 500 MG PO TABS
500.0000 mg | ORAL_TABLET | Freq: Two times a day (BID) | ORAL | 0 refills | Status: DC
Start: 2020-09-11 — End: 2021-03-09

## 2020-09-11 MED FILL — metroNIDAZOLE 500 MG TABS: 500 | 7 days supply | Qty: 14 | Fill #0

## 2020-09-11 NOTE — Telephone Encounter (Signed)
-----   Message from Tresea Mall, CNM sent at 09/11/2020 11:55 AM EDT ----- Patient has BV. I will send in rx. Please call her.

## 2020-09-11 NOTE — Addendum Note (Signed)
Addended by: Marcille Buffy D on: 09/11/2020 11:56 AM   Modules accepted: Orders

## 2020-09-11 NOTE — Telephone Encounter (Signed)
Called & informed patient of results and medication sent to pharmacy. Also informed patient to avoid alcohol while taking the medicine. Patient verbalized understanding.

## 2020-09-12 ENCOUNTER — Other Ambulatory Visit: Payer: Self-pay | Admitting: Advanced Practice Midwife

## 2020-09-12 LAB — URINE CULTURE

## 2020-09-12 MED ORDER — AMOXICILLIN-POT CLAVULANATE 500-125 MG PO TABS: 1.0000 | ORAL_TABLET | Freq: Two times a day (BID) | ORAL | 0 refills | Status: AC

## 2020-09-12 MED FILL — AMOX-CLAV 500-125 MG TABLET: 500-125 | 5 days supply | Qty: 10 | Fill #0

## 2020-09-12 NOTE — Addendum Note (Signed)
Addended by: Marcille Buffy D on: 09/12/2020 01:24 PM   Modules accepted: Orders

## 2020-09-16 ENCOUNTER — Ambulatory Visit: Payer: Self-pay | Admitting: Critical Care Medicine

## 2020-09-17 ENCOUNTER — Telehealth (INDEPENDENT_AMBULATORY_CARE_PROVIDER_SITE_OTHER): Payer: Medicaid Other | Admitting: Lactation Services

## 2020-09-17 DIAGNOSIS — Z712 Person consulting for explanation of examination or test findings: Secondary | ICD-10-CM

## 2020-09-17 NOTE — Telephone Encounter (Signed)
Patient did not read My Chart message in regards to UTI. She reports she has picked up both prescriptions for ATB and is taking both. Patient with no questions or concerns.

## 2020-10-07 ENCOUNTER — Ambulatory Visit: Payer: Self-pay | Admitting: Critical Care Medicine

## 2020-10-07 ENCOUNTER — Telehealth (HOSPITAL_COMMUNITY): Payer: Medicaid Other

## 2020-10-07 ENCOUNTER — Other Ambulatory Visit: Payer: Self-pay

## 2020-10-07 NOTE — Progress Notes (Deleted)
Subjective:    Patient ID: Sarah Long, female    DOB: 07-05-1971, 49 y.o.   MRN: [id number]  History of Present Illness:  07/10/19 This is a 49 year old female history of type 2 diabetes and bipolar disorder.  Patient was last seen in June and was found to have hypertension not well controlled, a holosystolic murmur which was found to be physiologic with normal echocardiogram, type 2 diabetes poorly controlled, bipolar disorder, homelessness, and ongoing tobacco use.  The patient now is moved back to the homeless shelter away from the hotel she was housed in over the pandemic.  Note today on arrival blood sugar was greater than 500 she states she has not taken her insulin recently.  The patient has been compliant with her blood pressure medications however is still smoking a pack a day of cigarettes.  Patient has been compliant with her [name] that she receives from family services of the [place].  The patient does have bunions in the feet but is yet to have her appointment with podiatry  10/02/2019 Patient is seen in follow-up from an August visit for poorly controlled type 2 diabetes, tobacco use, hyperlipidemia.  The patient lives in a shelter at [employer] and does work Building surveyor.  The patient often forgets to take her blood pressure medicine but she is taking her insulin daily.  Unfortunate her diet has been very noncompliant.  She eats lots of fast food and drinks sodas that contain sugar and sugar Gatorade.  She apparently had a very carbohydrate filled lunch along with sugar Sprite and sugar Gatorade and comes in with a CBG of 501 at this visit.  The patient is maintaining Lantus 80 units daily note she is already had her flu vaccine  She has improvement in her anxiety and depression.  She does complain of significant menstrual cramps that come on with her periods  The patient has tried ibuprofen 6 mg dose without relief.  03/11/2020 Has been in hosp since last ov The  pt was admitted severely ill with pancreatitis, AKI, multiple electrolyte disorders, DKA, complete heart block and evidence of heart failure without evidence of coronary artery disease on cardiac catheterization.  Below is the discharge summary  Dc summary:  Admit date: 02/09/2020 Discharge date: 02/26/2020 Consultations: Cardiology, [facility] Admitted From: home Disposition: home  Discharge Diagnoses:  Principal Problem:   Complete heart block ([place]) Active Problems:   Acute pancreatitis without infection or necrosis   Diabetic ketoacidosis without coma associated with type 2 diabetes mellitus (HCC)   Elevated troponin   NSTEMI (non-ST elevated myocardial infarction) (HCC)   Lactic acid acidosis   Endotracheally intubated   ACS (acute coronary syndrome) (HCC)   Prolonged QT interval   Acute on chronic combined systolic and diastolic CHF (congestive heart failure) [facility])   Hospital Course Summary: 49 yr old obese F w/ significant PMHx alcohol abuse, tobacco abuse, IDDM, HTN, HLD and morbid obesity presented to [facility] with 10/10 chest pain. Per the triage note pt reported vomiting all day, being short of breath and having severe chest pain feelingnear syncopal, although shedenied loss of consciousness oractualsyncopal episode.  ED Course: Afebrile, BP133/23mmHg HR 41 RR 26 O2 sat 94% . CBG>$RemoveB'600mg'upKHVjxv$ /dl.Brandt.Salm. AG 30. Na 131. K 5.3. Cr 2.85. Lipase 1879 AST 582. ALT 294. EKG showed an Idioventricular rhythm with LAD, AVB, LVH and an inferior infarct (QRS 162 ms and QT/QTc 574/467 ms). Troponin 730>>2000.Chest x-ray showed mild interstitial prominence suggesting pulmonary edema, low lung volumes.  Patient went into complete AVB with HR of 39 while in EDrequiringexternal pacer. Done she becomes somnolent after IV Versed requiring intubation.Hospital course:Cardiology consulted and she was admitted to ICU. She was also started on Unasyn for possible aspiration  pneumonia.Patient underwent LHC on 3/7 which was negative for CAD. She had temporary venous pacemaker advanced at the same time. Echo with EF of 40 to 45%, global hypokinesis, severe LVH and indeterminate diastolic function but no other significant finding. Patient was extubated on 02/11/2020.She eventually had permanent PPM/ICD implanton 02/13/2020.Patient also received evaluation/treatment foracute pancreatitis,transaminitis,rhabdomyolysis, significant hypocalcemia/vitamin D deficiency and iron deficiency anemiawhile in ICU.Patient transferred to [facility] service on 3/11. Cardiac issues been stable. Patient however has continued to complain of postprandial abdominal bloating, frequent diarrhea associated with worsening leukocytosis on lab work. Repeat CT on 3/16 showed persistent/possible worsening of pancreatitis and started on Rocephin/Flagyl. GI consulted and leukocytosis has been downtrending on antibiotics. She has also had generalized edemaduring the hospital course with echo findings as above.  Acute pancreatitis with abdominal pain and watery diarrhea-Leukocytosis got worse on 3/16 with persistent diarrhea , stool study negative for cdiff, stool PCR panel negative , stool culturesnegative for E. Coli/Campylobacter/Shigella/Salmonellaetc.Repeat CT scan done on 3/16 showed worsening of pancreatitis-started on Cipro and Flagyl, seen by [facility], Dr. Paulita Fujita and startedonpancrelipase supplement. She completed 5 days antibiotic while here.WBC count andlipase trending down,diet advanced to soft and regular diet which she has been tolerating well >48 hrs.Cleared by GI to discharge on current dose of probiotic/pancreatic enzymes. Okay to use as needed loperamide but patient no longer has diarrhea and dicontinued this medication in concern for prolonged qtc. She was receiving IV fentanyl for abdominal pain during the hospitals course. Changed to oral meds today. Reports cramping pain when  having BM or on eating, will discharge on Bentyl prn. Follow-up [facility], Dr Paulita Fujita, in few weeks.  Anasarca:Patient noted to be clinically edematouswithfluid overloadduring the hospital course. She hadnet positive 18.7 L since admission perI's and O'sdocumentation.S/p 1 dose of Lasix on 3/17, x1 on 3/18, x1 on 3/19.Improving ,encourage ambulation.Nutrition supplement.Discontinued  sodium bicarb tablets that she has been recieveing to avoid salt/fluid retention.Started onoral lasix yesterday which she is tolerating well with stable creatinine.  Upper extremity edema is almost resolved but still has lower extremity edema.  Recommended to take Lasix 20 mg daily for rest of the week then change to as needed use.  Advised to take extra potassium with Lasix to avoid hypokalemia in the setting of prolonged QTC.    Non-STEMI,Complete Heart Block/bifascicular block (RBBB and LAFB) :Patientpresented with severe chest painand noted to be in complete heart block.   Seen by cardiology and underwent cardiac cath with no acute blockages. She is now s/p PPM and ICD on 02/13/2020. Device interrogation normal.-Cardiologyfollowed up on patient 3/22, Steri-Strips removed. Wound healing well with no signs of redness, discharge or edema. Cardiology cleared for discharge and stated okay to drive. Okay for bath/shower but advised no lotions, creams or ointments or powders on device site.  Acutesystolic CHFwith acute hypoxic respiratory failure:Present on admission with chest x-ray suggestive of cardiomegaly/pulmonary edema. Echo on 3/7with LVEF of 40 to 45%, global hypokinesis, severe LVH.Required intubation in the ED. Patient received significant amount of IV fluids for pancreatitis/rhabdomyolysis initially and then received IV diuretics, now saturating well on room air. DC sodium bicarb tablets to avoid salt/fluid retention.  Discharged on low-dose oral Lasix with potassium as  discussed above.  To follow-up PCP, cardiology clinic in a week  for further fluid/electrolyte balance assessment and titration of diuretics.  Not on ACE inhibitors due to AKI and hypotension.  Uncontrolled insulin dependent DM-2 withDKA: Present on admission. Patient was struggling to get insulin supplies prior to admission. Presented with DKA that has resolved. A1c 12.7%. Hospital course complicated byhypoglycemicas well ashyperglycemicepisodes.C-peptide and insulin level within normal range.Currently on Lantus 22 units twice a day,meal coverage and sliding scaleswith blood glucose well controlled, less than 200. Seen by diabetes educator and follow-up with [place] and wellness advised.  Medications ordered through wellness clinic.  AKI-multifactorial including cardiorenal, rhabdoand iatrogenic from ARB/HCTZ and NSAIDs Now resolved. Avoid nephrotoxins. DC sodium bicarb tablets as metabolic acidosis now resolved. Monitor with periodic labs as outpatient while on oral lasix.  Hypokalemia/hypomagnesemia/hypophosphatemia-replaced as needed throughout the hospital course. Currently on scheduled potassium 23meq BID but potassium still only at 3.5, now also started on oral lasix.  Will continue potassium 40 mEq supplements upon discharge and advised additional potassium supplementation while taking Lasix.  Hopefully potassium level will come up once magnesium level improves and since diarrhea resolved.  Hypocalcemia/vitamin D deficiency: Calcium 4.8. Multifactorial including vitamin D deficiency (<4.2), rhabdo and acute pancreatitis. PTH is appropriately high. Urine calcium appropriately low at 4.3.-s/p Calcium gluconate 3 g. Continue P.o. vitamin D 50,000 IU weekly and 2000 daily-suspect poor observation in the setting of acute pancreatitis.Calciumlevel nowimproving, today isat 8.0,with lastalbuminlevel at1.5on 3/19.  QTc prolongation-monitoredon telemetryduring the  hospital course, did have arrhythmias/electrolyte abnormalities as explained above, keep mag close to 2(currently at 1.8),potassiumclose to4(currently at 3.5).  Patient received 1 g of IV magnesium yesterday and 2 g of IV magnesium today.  Will discharge on oral magnesium supplements.Avoid QT prolonging agents,QTCnowdown from 619to 526 now (also EKG showing paced rhythm so possibly atrial QTC slightly lower).  Rhabdomyolysis: Due to alcohol? Statin? CK [phone]. CK-MB low.Received IV fluids during the hospital course. Statins held.CK normalizednow.  Recommend repeat lipid profile as outpatient.  Alcohol use disorder:Reportedly drinks about 5-6 drinks including liquors.Counseledto quit. -ConsultedTOC for resources.  Multivitamin/thiamine  Tobacco use disorder: Smokes about a pack a day.-Cessation counseling.Nicotine patchwhile here  Obesity:Body mass index is 37.63 kg/m.Counseled regarding risks and diet modifications.  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  She states her children were helping her find a place when she got sick and got admitted to the hospital.  Patient met withsocial worker and outpatient referrals have been placed for medication assistance, home health and DME..  Patient already has a PCP Dr. Joya Gaskins with home she plans to follow-up in 5 to 7 days.  Since discharge the patient states she is somewhat improved.  She has less swelling and edema.  She feels she may actually be too dry.  She has been taking insulin as prescribed.  She is no longer drinking alcohol.  She remains in a motel situation.  She has difficulty with sleeping.  She is quit smoking tobacco products as well.  Note blood sugar at home was 203 and on arrival here it was 342  Patient currently denies chest pain  does have dyspnea with exertion.  She needs follow-up appointment scheduled for cardiology and gastroenterology.  She also is requesting a scale  Wt Readings from Last 3 Encounters: 03/11/20 : 189 lb 9.6 oz (86 kg) 02/25/20 : 221 lb 9.6 oz (100.5 kg) 10/02/19 : 209 lb 12.8 oz (95.2 kg)  04/01/2020 Since the last visit the patient is improved.  She is eating better.  She complains of irritation in the mouth.  Her feet are peeling.  She is back at work and she works at [employer].  She has an upcoming cardiology appointment scheduled.  She is yet to hear from gastroenterology about her pancreatitis follow-up.  She now is on long-acting insulin 64 units daily and NovoLog 10 units before meals 3 times daily her fasting sugars now been in the 1 70-1 80 range.  She complains of a toothache in the right upper jaw along with right ear pain and wishes this to be evaluated she is on Latuda now for her bipolar disorder through family services [facility] the [place]  Note at the last visit her magnesium and phosphorus levels have returned to normal  05/13/2020 Patient seen in return follow-up with history of combined systolic diastolic heart failure and also pacemaker placement due to complete heart block.  Patient's been seen in cardiology clinic and now is on the [facility] 24/26 1 daily is off furosemide at this time.  She states her dyspnea is improved.  Blood glucoses have improved as well in the 1 7190 range however is a bit higher than has been in the past and she does have nausea and some emesis.  The patient does have chronic pancreatitis and is staying on her pancreatic enzyme replacement therapy.  She states Zofran has helped nausea in the past.  She still living in a hotel environment.  She still working nights.  Sometimes she will skip her evening dose of Basaglar.  She is up to 70 units daily on the [facility]  07/16/2020 This patient is seen return follow-up for combined systolic diastolic heart failure,  hypertension, chronic pancreatitis with pancreatic insufficiency, type 2 diabetes, hypercholesterolemia.  On arrival blood sugar was 358.  This patient's been having significant difficulty and following a significant diet.  The patient states that based on her work hours she is not able to eat meals on a regular basis she is now working from 3-11 and often misses several meals.  The patient states that she has had some vaginal discharge and some pain in the vaginal area.  She is compliant with her insulin.  She did run out of the long-acting medication is missed some of her insulin.  She tends to stop insulin at times.  She is not compliant with her diet.  Patient is still living in a hotel environment.  She denies any chest pain or shortness of breath.  Still has significant dental caries as just received any form of dental care. The patient states her urine has been cloudy and there is been increased odor or vaginal discharge in her urine.  She complains of abdominal pain lower and back pain.  She denies any alcohol.  She still smoking 4 to 5 cigarettes a day.  10/07/2020 In ED since last OV   Chronic combined systolic and diastolic CHF (congestive heart failure) (HCC) Stable at this time will continue Entresto twice daily  Heart block AV complete (HCC) Pacemaker is in place no changes made  HYPERTENSION, BENIGN SYSTEMIC Hypertension with plus minus control  Continue Entresto  Dental caries Patient given dental resources and now she has Medicaid she is to pursue these for tooth extractions  Diabetes type 2, uncontrolled (HCC) Poorly controlled diabetes issue of lack of dietary compliance and lack of insulin compliance patient encouraged to take her insulin as prescribed and refills were  given diet was given to the patient again  Hypercholesteremia Continue current dose of lipid therapy  Vaginal discharge Cervical vaginal screen will be obtained  Acute pancreatitis without infection  or necrosis History of recurrent pancreatitis we will follow-up metabolic panel and lipase  Smoking    Past Medical History:  Diagnosis Date  . Abnormal Pap smear 1998  . Abnormal vaginal bleeding 12/19/2011  . Bacterial infection   . Bipolar 1 disorder ([place])   . Blister of second toe of left foot 11/21/2016  . Candida vaginitis 07/2007  . Depression    recently added wellbutrin-has not taken yet for bipolar  . Diabetes in pregnancy   . Diabetes mellitus    nph 20U qam and qpm, regular with meals  . Diabetic ketoacidosis without coma associated with type 2 diabetes mellitus ([place])   . Fibroid   . Galactorrhea of right breast 2008  . H/O amenorrhea 07/2007  . H/O dizziness 10/14/11  . H/O dysmenorrhea 2010  . H/O menorrhagia 10/14/11  . H/O varicella   . Headache(784.0)   . Heavy vaginal bleeding due to contraceptive injection use 10/12/2011   Depo Provera  . Herpes   . HSV-2 infection 01/03/2009  . Hx: UTI (urinary tract infection) 2009  . Hypertension    on aldomet  . Increased BMI 2010  . Irregular uterine bleeding 04/04/2012   Pt has mirena   . Obesity 10/14/11  . Oligomenorrhea 07/2007  . Pelvic pain in female   . Preterm labor   . Trichomonas   . Yeast infection      Family History  Problem Relation Age of Onset  . Hypertension Mother   . Diabetes Mother   . Heart disease Mother   . Hypertension Father   . Diabetes Father   . Heart disease Father   . Stroke Maternal Grandfather   . Other Neg Hx      Social History   Socioeconomic History  . Marital status: Married    Spouse name: Not on file  . Number of children: Not on file  . Years of education: Not on file  . Highest education level: Not on file  Occupational History  . Not on file  Tobacco Use  . Smoking status: Former Smoker    Packs/day: 0.50    Years: 20.00    Pack years: 10.00    Types: Cigarettes  . Smokeless tobacco: Never Used  Vaping Use  . Vaping Use: Never used  Substance and  Sexual Activity  . Alcohol use: No  . Drug use: No  . Sexual activity: Yes    Birth control/protection: None  Other Topics Concern  . Not on file  Social History Narrative  . Not on file   Social Determinants of Health   Financial Resource Strain:   . Difficulty of Paying Living Expenses: Not on file  Food Insecurity:   . Worried About Charity fundraiser in the Last Year: Not on file  . Ran Out of Food in the Last Year: Not on file  Transportation Needs:   . Lack of Transportation (Medical): Not on file  . Lack of Transportation (Non-Medical): Not on file  Physical Activity:   . Days of Exercise per Week: Not on file  . Minutes of Exercise per Session: Not on file  Stress:   . Feeling of Stress : Not on file  Social Connections:   . Frequency of Communication with Friends and Family: Not on file  .  Frequency of Social Gatherings with Friends and Family: Not on file  . Attends Religious Services: Not on file  . Active Member of Clubs or Organizations: Not on file  . Attends Archivist Meetings: Not on file  . Marital Status: Not on file  Intimate Partner Violence:   . Fear of Current or Ex-Partner: Not on file  . Emotionally Abused: Not on file  . Physically Abused: Not on file  . Sexually Abused: Not on file     Allergies  Allergen Reactions  . Strawberry Extract Hives  . Sulfa Antibiotics Hives and Itching     Outpatient Medications Prior to Visit  Medication Sig Dispense Refill  . Accu-Chek Softclix Lancets lancets Use as instructed to check blood sugar three times daily. E11.65 100 each 6  . Blood Glucose Monitoring Suppl (ACCU-CHEK GUIDE ME) w/Device KIT 1 kit by Does not apply route in the morning, at noon, and at bedtime. Use as instructed to check blood sugar three times daily. E11.65 1 kit 0  . diclofenac Sodium (VOLTAREN) 1 % GEL Apply 4 g topically 4 (four) times daily. 100 g 0  . diphenhydrAMINE (BENADRYL) 25 mg capsule Take 25 mg by mouth every  6 (six) hours as needed for allergies.    [name] glucose blood (ACCU-CHEK GUIDE) test strip Use as instructed to check blood sugar three times daily. E11.65 100 each 6  . insulin aspart (NOVOLOG FLEXPEN) 100 UNIT/ML FlexPen 10 unit with meals Sliding scale three times daily: 0-150: 0 151-200: 2 units  201-250: 4 units  251-300: 6 units  301-350: 8 units  351- 400 10 units   >400  12 units 15 mL 11  . Insulin Glargine (BASAGLAR KWIKPEN) 100 UNIT/ML Inject 0.8 mLs (80 Units total) into the skin daily. 32 mL 4  . Insulin Pen Needle (B-D ULTRAFINE III SHORT PEN) 31G X 8 MM MISC 1 application by Does not apply route daily. 90 each 3  . Lancet Devices (ADJUSTABLE LANCING DEVICE) MISC Inject 1 each into the skin 3 (three) times daily. 100 each 5  . LATUDA 40 MG TABS tablet SMARTSIG:1 Tablet(s) By Mouth Every Evening    . metroNIDAZOLE (FLAGYL) 500 MG tablet Take 1 tablet (500 mg total) by mouth 2 (two) times daily. 14 tablet 0  . nitrofurantoin, macrocrystal-monohydrate, (MACROBID) 100 MG capsule Take 1 capsule (100 mg total) by mouth 2 (two) times daily. 10 capsule 0  . ondansetron (ZOFRAN) 4 MG tablet Take 1 tablet (4 mg total) by mouth every 8 (eight) hours as needed for nausea or vomiting. 30 tablet 0  . Pancrelipase, Lip-Prot-Amyl, 24000-76000 units CPEP Take 1 capsule (24,000 Units total) by mouth 3 (three) times daily with meals. 270 capsule 1  . rosuvastatin (CRESTOR) 10 MG tablet Take 1 tablet (10 mg total) by mouth daily. 90 tablet 3  . sacubitril-valsartan (ENTRESTO) 49-51 MG Take 1 tablet by mouth 2 (two) times daily. 60 tablet 11  . thiamine 100 MG tablet Take 1 tablet (100 mg total) by mouth daily. 60 tablet 1  . varenicline (CHANTIX STARTING MONTH PAK) 0.5 MG X 11 & 1 MG X 42 tablet Take one 0.5 mg tablet by mouth once daily for 3 days, then increase to one 0.5 mg tablet twice daily for 4 days, then increase to one 1 mg tablet twice daily. 53 tablet 5   No facility-administered medications prior  to visit.      Review of Systems  Constitutional: Negative for appetite  change, fatigue, fever and unexpected weight change.  HENT: Negative.   Eyes: Negative for visual disturbance.  Respiratory: Negative for apnea, cough, choking, chest tightness, shortness of breath, wheezing and stridor.   Cardiovascular: Negative for chest pain, palpitations and leg swelling.  Gastrointestinal: Positive for nausea and vomiting. Negative for diarrhea.  Endocrine: Negative for polydipsia and polyuria.  Genitourinary: Negative.   Skin: Negative.   Neurological: Negative.   Hematological: Negative.   Psychiatric/Behavioral: Positive for dysphoric mood. Negative for self-injury, sleep disturbance and suicidal ideas. The patient is not nervous/anxious.        Objective:   Physical Exam There were no vitals filed for this visit.  Gen: Pleasant, well-nourished, in no distress,  normal affect  ENT: No lesions,  mouth clear,  oropharynx clear, no postnasal drip  Neck: No JVD, no TMG, no carotid bruits  Lungs: No use of accessory muscles, no dullness to percussion, clear without rales or rhonchi  Cardiovascular: RRR, heart sounds normal, no murmur or gallops, no peripheral edema  Abdomen: soft and NT, no HSM,  BS normal  Musculoskeletal: No deformities, no cyanosis or clubbing  Neuro: alert, non focal  Skin: Warm, no lesions or rashes Foot exam was normal   BMP Latest Ref Rng & Units 07/16/2020 05/08/2020 04/28/2020  Glucose 65 - 99 mg/dL 359(H) 200(H) 145(H)  BUN 6 - 24 mg/dL $Remove'15 17 15  'kkMdlzg$ Creatinine 0.57 - 1.00 mg/dL 0.94 0.92 0.86  BUN/Creat Ratio 9 - 23 16 - -  Sodium 134 - 144 mmol/L 136 138 138  Potassium 3.5 - 5.2 mmol/L 4.3 3.9 3.9  Chloride 96 - 106 mmol/L 101 103 107  CO2 20 - 29 mmol/L 20 26 20(L)  Calcium 8.7 - 10.2 mg/dL 9.3 8.8(L) 8.9   Hepatic Function Latest Ref Rng & Units 07/16/2020 03/11/2020 02/22/2020  Total Protein 6.0 - 8.5 g/dL 7.5 7.8 5.9(L)  Albumin 3.8 - 4.8 g/dL 4.1  3.8 1.5(L)  AST 0 - 40 IU/L 16 63(H) 23  ALT 0 - 32 IU/L $Remov'14 21 24  'PsoYcU$ Alk Phosphatase 48 - 121 IU/L 95 174(H) 122  Total Bilirubin 0.0 - 1.2 mg/dL <0.2 0.2 0.6  Bilirubin, Direct 0.0 - 0.2 mg/dL - - -   CBC Latest Ref Rng & Units 07/16/2020 03/11/2020 02/26/2020  WBC 3.4 - 10.8 x10E3/uL 6.5 6.6 10.8(H)  Hemoglobin 11.1 - 15.9 g/dL 11.6 11.5 8.0(L)  Hematocrit 34.0 - 46.6 % 35.2 36.5 25.6(L)  Platelets 150 - 450 x10E3/uL 229 307 344   Lipase     Component Value Date/Time   LIPASE 75 (H) 07/16/2020 7494        Assessment & Plan:  I personally reviewed all images and lab data in the [facility] system as well as any outside material available during this office visit and agree with the  radiology impressions.   No problem-specific Assessment & Plan notes found for this encounter.   There are no diagnoses linked to this encounter.

## 2020-10-09 ENCOUNTER — Ambulatory Visit (HOSPITAL_COMMUNITY): Payer: Medicaid Other | Admitting: Clinical

## 2020-11-04 ENCOUNTER — Telehealth (HOSPITAL_COMMUNITY): Payer: Medicaid Other

## 2020-11-04 ENCOUNTER — Other Ambulatory Visit: Payer: Self-pay

## 2021-01-03 ENCOUNTER — Encounter (HOSPITAL_COMMUNITY): Payer: Self-pay | Admitting: *Deleted

## 2021-01-03 ENCOUNTER — Other Ambulatory Visit: Payer: Self-pay

## 2021-01-03 ENCOUNTER — Emergency Department (HOSPITAL_COMMUNITY): Payer: Medicaid Other

## 2021-01-03 ENCOUNTER — Emergency Department (HOSPITAL_COMMUNITY)
Admission: EM | Admit: 2021-01-03 | Discharge: 2021-01-03 | Disposition: A | Discharge status: Home / Self Care | Payer: Medicaid Other | Attending: Emergency Medicine | Admitting: Emergency Medicine

## 2021-01-03 DIAGNOSIS — S0181XA Laceration without foreign body of other part of head, initial encounter: Secondary | ICD-10-CM | POA: Diagnosis not present

## 2021-01-03 DIAGNOSIS — Z794 Long term (current) use of insulin: Secondary | ICD-10-CM | POA: Insufficient documentation

## 2021-01-03 DIAGNOSIS — S0101XA Laceration without foreign body of scalp, initial encounter: Secondary | ICD-10-CM

## 2021-01-03 DIAGNOSIS — Y92009 Unspecified place in unspecified non-institutional (private) residence as the place of occurrence of the external cause: Secondary | ICD-10-CM | POA: Insufficient documentation

## 2021-01-03 DIAGNOSIS — S0285XA Fracture of orbit, unspecified, initial encounter for closed fracture: Secondary | ICD-10-CM

## 2021-01-03 DIAGNOSIS — Z87891 Personal history of nicotine dependence: Secondary | ICD-10-CM | POA: Diagnosis not present

## 2021-01-03 DIAGNOSIS — S01412A Laceration without foreign body of left cheek and temporomandibular area, initial encounter: Secondary | ICD-10-CM | POA: Diagnosis not present

## 2021-01-03 DIAGNOSIS — Z79899 Other long term (current) drug therapy: Secondary | ICD-10-CM | POA: Insufficient documentation

## 2021-01-03 DIAGNOSIS — S0232XA Fracture of orbital floor, left side, initial encounter for closed fracture: Secondary | ICD-10-CM | POA: Insufficient documentation

## 2021-01-03 DIAGNOSIS — S0993XA Unspecified injury of face, initial encounter: Secondary | ICD-10-CM | POA: Diagnosis present

## 2021-01-03 DIAGNOSIS — E1140 Type 2 diabetes mellitus with diabetic neuropathy, unspecified: Secondary | ICD-10-CM | POA: Diagnosis not present

## 2021-01-03 DIAGNOSIS — S1181XA Laceration without foreign body of other specified part of neck, initial encounter: Secondary | ICD-10-CM | POA: Diagnosis not present

## 2021-01-03 DIAGNOSIS — Z23 Encounter for immunization: Secondary | ICD-10-CM | POA: Insufficient documentation

## 2021-01-03 DIAGNOSIS — I5042 Chronic combined systolic (congestive) and diastolic (congestive) heart failure: Secondary | ICD-10-CM | POA: Insufficient documentation

## 2021-01-03 DIAGNOSIS — Z950 Presence of cardiac pacemaker: Secondary | ICD-10-CM | POA: Insufficient documentation

## 2021-01-03 DIAGNOSIS — I110 Hypertensive heart disease with heart failure: Secondary | ICD-10-CM | POA: Insufficient documentation

## 2021-01-03 DIAGNOSIS — T07XXXA Unspecified multiple injuries, initial encounter: Secondary | ICD-10-CM

## 2021-01-03 MED ORDER — IBUPROFEN 800 MG PO TABS: 800.0000 mg | ORAL_TABLET | Freq: Three times a day (TID) | ORAL | 0 refills | Status: DC

## 2021-01-03 MED ORDER — CEFAZOLIN SODIUM-DEXTROSE 1-4 GM/50ML-% IV SOLN
1.0000 g | Freq: Once | INTRAVENOUS | Status: AC
  Administered 2021-01-03: 1 g via INTRAVENOUS
  Filled 2021-01-03: qty 50

## 2021-01-03 MED ORDER — LIDOCAINE HCL (PF) 1 % IJ SOLN
30.0000 mL | Freq: Once | INTRAMUSCULAR | Status: AC
  Administered 2021-01-03: 30 mL via INTRADERMAL
  Filled 2021-01-03: qty 30

## 2021-01-03 MED ORDER — CEPHALEXIN 500 MG PO CAPS: 500.0000 mg | ORAL_CAPSULE | Freq: Four times a day (QID) | ORAL | 0 refills | Status: DC

## 2021-01-03 MED ORDER — HYDROCODONE-ACETAMINOPHEN 5-325 MG PO TABS: 1.0000 | ORAL_TABLET | ORAL | 0 refills | Status: DC | PRN

## 2021-01-03 MED ORDER — TETANUS-DIPHTH-ACELL PERTUSSIS 5-2.5-18.5 LF-MCG/0.5 IM SUSY
0.5000 mL | PREFILLED_SYRINGE | Freq: Once | INTRAMUSCULAR | Status: AC
  Administered 2021-01-03: 0.5 mL via INTRAMUSCULAR
  Filled 2021-01-03: qty 0.5
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
## 2021-01-03 NOTE — ED Notes (Signed)
Patient transported to CT 
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
## 2021-01-03 NOTE — Discharge Instructions (Signed)
Take the prescribed medication as directed.  Use caution when taking pain medication, can make you sleepy/drowsy. Keep wounds clean with soap and warm water. Follow-up with your primary care doctor to have sutures removed in about 1 week. You will need follow-up with ENT and ophthalmology about the fracture of your left eye. Do not blow your nose, drink from straw, try to keep your head elevated when sleeping.  See attached for more information. Return to the ED for new or worsening symptoms.

## 2021-01-09 ENCOUNTER — Ambulatory Visit: Payer: Medicaid Other | Admitting: Internal Medicine

## 2021-01-10 ENCOUNTER — Other Ambulatory Visit: Payer: Self-pay

## 2021-01-10 ENCOUNTER — Ambulatory Visit
Admission: EM | Admit: 2021-01-10 | Discharge: 2021-01-10 | Disposition: A | Discharge status: Home / Self Care | Payer: Medicaid Other

## 2021-01-10 DIAGNOSIS — R42 Dizziness and giddiness: Secondary | ICD-10-CM | POA: Diagnosis not present

## 2021-01-10 DIAGNOSIS — Z4802 Encounter for removal of sutures: Secondary | ICD-10-CM
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
## 2021-01-25 ENCOUNTER — Ambulatory Visit
Admission: EM | Admit: 2021-01-25 | Discharge: 2021-01-25 | Disposition: A | Discharge status: Home / Self Care | Payer: Medicaid Other | Attending: Emergency Medicine | Admitting: Emergency Medicine

## 2021-01-25 ENCOUNTER — Encounter: Payer: Self-pay | Admitting: Emergency Medicine

## 2021-01-25 ENCOUNTER — Ambulatory Visit (INDEPENDENT_AMBULATORY_CARE_PROVIDER_SITE_OTHER): Payer: Medicaid Other

## 2021-01-25 ENCOUNTER — Other Ambulatory Visit: Payer: Self-pay

## 2021-01-25 DIAGNOSIS — M25511 Pain in right shoulder: Secondary | ICD-10-CM

## 2021-01-25 DIAGNOSIS — M79601 Pain in right arm: Secondary | ICD-10-CM | POA: Diagnosis not present

## 2021-01-25 DIAGNOSIS — R739 Hyperglycemia, unspecified: Secondary | ICD-10-CM

## 2021-01-25 LAB — POCT FASTING CBG KUC MANUAL ENTRY: POCT Glucose (KUC): 260 mg/dL — AB (ref 70–99)

## 2021-01-25 MED ORDER — NAPROXEN 500 MG PO TABS: 500.0000 mg | ORAL_TABLET | Freq: Two times a day (BID) | ORAL | 0 refills | Status: DC

## 2021-01-25 MED ORDER — BASAGLAR KWIKPEN 100 UNIT/ML ~~LOC~~ SOPN: 80.0000 [IU] | PEN_INJECTOR | Freq: Every day | SUBCUTANEOUS | 4 refills | Status: DC

## 2021-01-25 MED ORDER — NOVOLOG FLEXPEN 100 UNIT/ML ~~LOC~~ SOPN: PEN_INJECTOR | SUBCUTANEOUS | 11 refills | Status: DC

## 2021-01-25 MED ORDER — CYCLOBENZAPRINE HCL 5 MG PO TABS: 5.0000 mg | ORAL_TABLET | Freq: Two times a day (BID) | ORAL | 0 refills | Status: DC | PRN

## 2021-01-25 MED ORDER — KETOROLAC TROMETHAMINE 30 MG/ML IJ SOLN
30.0000 mg | Freq: Once | INTRAMUSCULAR | Status: AC
  Administered 2021-01-25: 30 mg via INTRAMUSCULAR

## 2021-01-25 NOTE — Discharge Instructions (Addendum)
Please use Naprosyn twice daily with food May supplement with Flexeril Gentle range of motion exercises-see attached Follow-up with sports medicine-contact below  May refill insulin to restart for your diabetes, follow-up with community health and wellness
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
## 2021-03-09 ENCOUNTER — Other Ambulatory Visit: Payer: Self-pay

## 2021-03-09 ENCOUNTER — Ambulatory Visit
Admission: EM | Admit: 2021-03-09 | Discharge: 2021-03-09 | Disposition: A | Discharge status: Home / Self Care | Payer: Medicaid Other | Attending: Emergency Medicine | Admitting: Emergency Medicine

## 2021-03-09 DIAGNOSIS — N760 Acute vaginitis: Secondary | ICD-10-CM | POA: Diagnosis not present

## 2021-03-09 DIAGNOSIS — B373 Candidiasis of vulva and vagina: Secondary | ICD-10-CM | POA: Diagnosis present

## 2021-03-09 DIAGNOSIS — N3001 Acute cystitis with hematuria: Secondary | ICD-10-CM | POA: Diagnosis not present

## 2021-03-09 DIAGNOSIS — B9689 Other specified bacterial agents as the cause of diseases classified elsewhere: Secondary | ICD-10-CM | POA: Insufficient documentation

## 2021-03-09 DIAGNOSIS — B3731 Acute candidiasis of vulva and vagina: Secondary | ICD-10-CM

## 2021-03-09 LAB — POCT URINALYSIS DIP (MANUAL ENTRY)
Bilirubin, UA: NEGATIVE
Glucose, UA: 500 mg/dL — AB
Ketones, POC UA: NEGATIVE mg/dL
Leukocytes, UA: NEGATIVE
Nitrite, UA: POSITIVE — AB
Protein Ur, POC: 100 mg/dL — AB
Spec Grav, UA: 1.025 (ref 1.010–1.025)
Urobilinogen, UA: 0.2 E.U./dL
pH, UA: 5.5 (ref 5.0–8.0)

## 2021-03-09 MED ORDER — FREESTYLE SYSTEM KIT
1.0000 | PACK | Freq: Every day | 0 refills | Status: DC
  Filled 2021-03-09: qty 1, fill #0

## 2021-03-09 MED ORDER — NITROFURANTOIN MONOHYD MACRO 100 MG PO CAPS
100.0000 mg | ORAL_CAPSULE | Freq: Two times a day (BID) | ORAL | 0 refills | Status: AC
  Filled 2021-03-09: qty 10, 5d supply, fill #0

## 2021-03-09 MED ORDER — PHENAZOPYRIDINE HCL 100 MG PO TABS
200.0000 mg | ORAL_TABLET | Freq: Three times a day (TID) | ORAL | 0 refills | Status: DC | PRN
  Filled 2021-03-09: qty 6, 2d supply, fill #0

## 2021-03-09 MED ORDER — FLUCONAZOLE 150 MG PO TABS
150.0000 mg | ORAL_TABLET | Freq: Once | ORAL | 1 refills | Status: AC
  Filled 2021-03-09: qty 2, 2d supply, fill #0
  Filled 2021-05-01: qty 2, 2d supply, fill #1

## 2021-03-09 MED ORDER — GLUCOSE BLOOD VI STRP
ORAL_STRIP | 2 refills | Status: DC
  Filled 2021-03-09: qty 100, 33d supply, fill #0

## 2021-03-09 MED ORDER — METRONIDAZOLE 500 MG PO TABS
500.0000 mg | ORAL_TABLET | Freq: Two times a day (BID) | ORAL | 0 refills | Status: AC
  Filled 2021-03-09: qty 14, 7d supply, fill #0

## 2021-03-09 NOTE — Discharge Instructions (Addendum)
Pyridium will turn your urine orange, but will help with your urinary symptoms.  Macrobid is an antibiotic for a urinary tract infection.  I have sent your urine off to make sure that we have you on the correct antibiotic.  Flagyl for BV, Diflucan for a yeast infection.  I have also written for another glucometer with strips.  Please keep a close eye on your sugar.

## 2021-03-09 NOTE — ED Triage Notes (Signed)
Pt presents with complaints of urinary tract infection. Pt presents with dysuria x 3 days along with urgency. Pt also endorses vaginal discharge x 2 weeks. Concerned for bacterial vaginosis

## 2021-03-09 NOTE — ED Provider Notes (Addendum)
HPI  SUBJECTIVE:  Sarah Long is a 50 y.o. female who presents with 5 days of dysuria, urgency, frequency, cloudy and odorous urine.  She reports right low back pain starting last night.  She reports white, chunky, cottage cheeselike vaginal discharge with a "fishy odor".  No labial rash, swelling.  No fevers, abdominal, pelvic pain.  She has not been sexually active in 3 months.  She tried refresh gel and Tylenol without improvement in her symptoms.  Symptoms are worse with urination.  No antibiotics in the past month.  She took Tylenol within 6 hours of evaluation.  She has a past medical history of diabetes, states her "glucose is out of control" as she lost her glucometer has not been checking her sugar.  She has a history of HSV, trichomonas, BV, yeast, frequent urinary tract infection, hypertension, DKA, pancreatitis.  States this feels similar to previous UTIs, BV and yeast infections.  No history of gonorrhea, chlamydia, HIV, syphilis, pyelonephritis, nephrolithiasis.  LMP: 3/17.  Denies the possibility of being pregnant.  MVH:QIONGE, Burnett Harry, MD   Past Medical History:  Diagnosis Date  . Abnormal Pap smear 1998  . Abnormal vaginal bleeding 12/19/2011  . Bacterial infection   . Bipolar 1 disorder ([place])   . Blister of second toe of left foot 11/21/2016  . Candida vaginitis 07/2007  . Depression    recently added wellbutrin-has not taken yet for bipolar  . Diabetes in pregnancy   . Diabetes mellitus    nph 20U qam and qpm, regular with meals  . Diabetic ketoacidosis without coma associated with type 2 diabetes mellitus ([place])   . Fibroid   . Galactorrhea of right breast 2008  . H/O amenorrhea 07/2007  . H/O dizziness 10/14/11  . H/O dysmenorrhea 2010  . H/O menorrhagia 10/14/11  . H/O varicella   . Headache(784.0)   . Heavy vaginal bleeding due to contraceptive injection use 10/12/2011   Depo Provera  . Herpes   . HSV-2 infection 01/03/2009  . Hx: UTI (urinary tract infection) 2009   . Hypertension    on aldomet  . Increased BMI 2010  . Irregular uterine bleeding 04/04/2012   Pt has mirena   . Obesity 10/14/11  . Oligomenorrhea 07/2007  . Pelvic pain in female   . Preterm labor   . Trichomonas   . Yeast infection     Past Surgical History:  Procedure Laterality Date  . CESAREAN SECTION  1991  . LEFT HEART CATH AND CORONARY ANGIOGRAPHY N/A 02/10/2020   Procedure: LEFT HEART CATH AND CORONARY ANGIOGRAPHY;  Surgeon: Troy Sine, MD;  Location: [facility];  Service: Cardiovascular;  Laterality: N/A;  . PACEMAKER IMPLANT N/A 02/13/2020   Procedure: PACEMAKER IMPLANT;  Surgeon: Constance Haw, MD;  Location: [facility];  Service: Cardiovascular;  Laterality: N/A;  . TEMPORARY PACEMAKER N/A 02/10/2020   Procedure: TEMPORARY PACEMAKER;  Surgeon: Troy Sine, MD;  Location: [facility];  Service: Cardiovascular;  Laterality: N/A;    Family History  Problem Relation Age of Onset  . Hypertension Mother   . Diabetes Mother   . Heart disease Mother   . Hypertension Father   . Diabetes Father   . Heart disease Father   . Stroke Maternal Grandfather   . Other Neg Hx     Social History   Tobacco Use  . Smoking status: Former Smoker    Packs/day: 0.50    Years: 20.00  Pack years: 10.00    Types: Cigarettes  . Smokeless tobacco: Never Used  Vaping Use  . Vaping Use: Never used  Substance Use Topics  . Alcohol use: No  . Drug use: No    No current facility-administered medications for this encounter.  Current Outpatient Medications:  .  fluconazole (DIFLUCAN) 150 MG tablet, Take 1 tablet (150 mg total) by mouth once for 1 dose. 1 tab po x 1. May repeat in 72 hours if no improvement, Disp: 2 tablet, Rfl: 1 .  glucose blood test strip, Check your sugar in the morning before you eat breakfast, and one hour after a meal., Disp: 100 each, Rfl: 2 .  glucose monitoring kit (FREESTYLE) monitoring kit, 1 each by Does not apply route  daily. Check glucose once in the morning before breakfast and 1 hour after a meal, Disp: 1 each, Rfl: 0 .  metroNIDAZOLE (FLAGYL) 500 MG tablet, Take 1 tablet (500 mg total) by mouth 2 (two) times daily for 7 days., Disp: 14 tablet, Rfl: 0 .  nitrofurantoin, macrocrystal-monohydrate, (MACROBID) 100 MG capsule, Take 1 capsule (100 mg total) by mouth 2 (two) times daily for 5 days., Disp: 10 capsule, Rfl: 0 .  phenazopyridine (PYRIDIUM) 200 MG tablet, Take 1 tablet (200 mg total) by mouth 3 (three) times daily as needed for pain., Disp: 6 tablet, Rfl: 0 .  Accu-Chek Softclix Lancets lancets, Use as instructed to check blood sugar three times daily. E11.65, Disp: 100 each, Rfl: 6 .  Blood Glucose Monitoring Suppl (ACCU-CHEK GUIDE ME) w/Device KIT, 1 kit by Does not apply route in the morning, at noon, and at bedtime. Use as instructed to check blood sugar three times daily. E11.65, Disp: 1 kit, Rfl: 0 .  cyclobenzaprine (FLEXERIL) 5 MG tablet, Take 1-2 tablets (5-10 mg total) by mouth 2 (two) times daily as needed for muscle spasms., Disp: 24 tablet, Rfl: 0 .  diphenhydrAMINE (BENADRYL) 25 mg capsule, Take 25 mg by mouth every 6 (six) hours as needed for allergies., Disp: , Rfl:  .  glucose blood (ACCU-CHEK GUIDE) test strip, Use as instructed to check blood sugar three times daily. E11.65, Disp: 100 each, Rfl: 6 .  insulin aspart (NOVOLOG FLEXPEN) 100 UNIT/ML FlexPen, 10 unit with meals Sliding scale three times daily: 0-150: 0 151-200: 2 units  201-250: 4 units  251-300: 6 units  301-350: 8 units  351- 400 10 units   >400  12 units, Disp: 15 mL, Rfl: 11 .  Insulin Glargine (BASAGLAR KWIKPEN) 100 UNIT/ML, Inject 80 Units into the skin daily., Disp: 32 mL, Rfl: 4 .  Insulin Pen Needle (B-D ULTRAFINE III SHORT PEN) 31G X 8 MM MISC, 1 application by Does not apply route daily., Disp: 90 each, Rfl: 3 .  Lancet Devices (ADJUSTABLE LANCING DEVICE) MISC, Inject 1 each into the skin 3 (three) times daily., Disp:  100 each, Rfl: 5 .  LATUDA 40 MG TABS tablet, SMARTSIG:1 Tablet(s) By Mouth Every Evening, Disp: , Rfl:  .  naproxen (NAPROSYN) 500 MG tablet, Take 1 tablet (500 mg total) by mouth 2 (two) times daily., Disp: 30 tablet, Rfl: 0 .  ondansetron (ZOFRAN) 4 MG tablet, Take 1 tablet (4 mg total) by mouth every 8 (eight) hours as needed for nausea or vomiting., Disp: 30 tablet, Rfl: 0 .  Pancrelipase, Lip-Prot-Amyl, 24000-76000 units CPEP, Take 1 capsule (24,000 Units total) by mouth 3 (three) times daily with meals., Disp: 270 capsule, Rfl: 1 .  rosuvastatin (CRESTOR)  10 MG tablet, Take 1 tablet (10 mg total) by mouth daily., Disp: 90 tablet, Rfl: 3 .  sacubitril-valsartan (ENTRESTO) 49-51 MG, Take 1 tablet by mouth 2 (two) times daily., Disp: 60 tablet, Rfl: 11 .  thiamine 100 MG tablet, Take 1 tablet (100 mg total) by mouth daily., Disp: 60 tablet, Rfl: 1 .  varenicline (CHANTIX STARTING MONTH PAK) 0.5 MG X 11 & 1 MG X 42 tablet, Take one 0.5 mg tablet by mouth once daily for 3 days, then increase to one 0.5 mg tablet twice daily for 4 days, then increase to one 1 mg tablet twice daily., Disp: 53 tablet, Rfl: 5  Allergies  Allergen Reactions  . Strawberry Extract Hives  . Sulfa Antibiotics Hives and Itching     ROS  As noted in HPI.   Physical Exam  BP (!) 151/93   Pulse 88   Temp 98.4 F (36.9 C)   Resp 19   LMP 02/19/2021   SpO2 98%   Constitutional: Well developed, well nourished, no acute distress Eyes:  EOMI, conjunctiva normal bilaterally HENT: Normocephalic, atraumatic,mucus membranes moist Respiratory: Normal inspiratory effort Cardiovascular: Normal rate GI: nondistended soft. No suprapubic tenderness. mild right flank tenderness. back: No CVA tenderness GU: Deferred skin: No rash, skin intact Musculoskeletal: no deformities Neurologic: Alert & oriented x 3, no focal neuro deficits Psychiatric: Speech and behavior appropriate   ED Course   Medications - No data to  display  Orders Placed This Encounter  Procedures  . Urine Culture    Standing Status:   Standing    Number of Occurrences:   1    Order Specific Question:   List patient's active antibiotics    Answer:   macrobid  . POCT urinalysis dipstick    Standing Status:   Standing    Number of Occurrences:   1    Results for orders placed or performed during the hospital encounter of 03/09/21 (from the past 24 hour(s))  POCT urinalysis dipstick     Status: Abnormal   Collection Time: 03/09/21  8:56 AM  Result Value Ref Range   Color, UA yellow yellow   Clarity, UA clear clear   Glucose, UA =500 (A) negative mg/dL   Bilirubin, UA negative negative   Ketones, POC UA negative negative mg/dL   Spec Grav, UA 1.025 1.010 - 1.025   Blood, UA small (A) negative   pH, UA 5.5 5.0 - 8.0   Protein Ur, POC =100 (A) negative mg/dL   Urobilinogen, UA 0.2 0.2 or 1.0 E.U./dL   Nitrite, UA Positive (A) Negative   Leukocytes, UA Negative Negative   No results found.  ED Clinical Impression  1. Acute cystitis with hematuria   2. Yeast vaginitis   3. BV (bacterial vaginosis)     ED Assessment/Plan  Suspect BV with the fishy vaginal odor, but also a yeast infection given the thick white vaginal discharge and uncontrolled diabetes.  UA also suggestive of urinary tract infection.  No ketones in urine.  Doubt DKA.  Will send home with Diflucan, Flagyl, Macrobid, Pyridium.  Urine culture sent to confirm diagnosis and biotic choice.  She states that her glucose has been out of control because she lost her glucometer.  Will write for another 1.  STDs are not a concern today, thus gonorrhea, chlamydia, trichomonas was not sent.  Follow-up with PMD as needed. Discussed labs, MDM, plan and followup with patient. Pt agrees with plan.   Meds ordered this encounter  Medications  . nitrofurantoin, macrocrystal-monohydrate, (MACROBID) 100 MG capsule    Sig: Take 1 capsule (100 mg total) by mouth 2 (two) times  daily for 5 days.    Dispense:  10 capsule    Refill:  0  . phenazopyridine (PYRIDIUM) 200 MG tablet    Sig: Take 1 tablet (200 mg total) by mouth 3 (three) times daily as needed for pain.    Dispense:  6 tablet    Refill:  0  . glucose monitoring kit (FREESTYLE) monitoring kit    Sig: 1 each by Does not apply route daily. Check glucose once in the morning before breakfast and 1 hour after a meal    Dispense:  1 each    Refill:  0  . glucose blood test strip    Sig: Check your sugar in the morning before you eat breakfast, and one hour after a meal.    Dispense:  100 each    Refill:  2  . metroNIDAZOLE (FLAGYL) 500 MG tablet    Sig: Take 1 tablet (500 mg total) by mouth 2 (two) times daily for 7 days.    Dispense:  14 tablet    Refill:  0  . fluconazole (DIFLUCAN) 150 MG tablet    Sig: Take 1 tablet (150 mg total) by mouth once for 1 dose. 1 tab po x 1. May repeat in 72 hours if no improvement    Dispense:  2 tablet    Refill:  1    *This clinic note was created using [employer]. Therefore, there may be occasional mistakes despite careful proofreading.  ?     Melynda Ripple, MD 03/09/21 [id number]    Melynda Ripple, MD 03/09/21 [phone]

## 2021-03-10 LAB — CERVICOVAGINAL ANCILLARY ONLY
Bacterial Vaginitis (gardnerella): POSITIVE — AB
Candida Glabrata: NEGATIVE
Candida Vaginitis: NEGATIVE
Comment: NEGATIVE
Comment: NEGATIVE
Comment: NEGATIVE

## 2021-03-12 LAB — URINE CULTURE: Culture: >=100000 — AB

## 2021-03-29 ENCOUNTER — Ambulatory Visit (INDEPENDENT_AMBULATORY_CARE_PROVIDER_SITE_OTHER): Payer: Medicaid Other

## 2021-03-29 ENCOUNTER — Encounter: Payer: Self-pay | Admitting: Emergency Medicine

## 2021-03-29 ENCOUNTER — Other Ambulatory Visit: Payer: Self-pay

## 2021-03-29 ENCOUNTER — Ambulatory Visit
Admission: EM | Admit: 2021-03-29 | Discharge: 2021-03-29 | Disposition: A | Discharge status: Home / Self Care | Payer: Medicaid Other | Attending: Emergency Medicine | Admitting: Emergency Medicine

## 2021-03-29 DIAGNOSIS — R059 Cough, unspecified: Secondary | ICD-10-CM

## 2021-03-29 DIAGNOSIS — Z760 Encounter for issue of repeat prescription: Secondary | ICD-10-CM

## 2021-03-29 DIAGNOSIS — I5042 Chronic combined systolic (congestive) and diastolic (congestive) heart failure: Secondary | ICD-10-CM

## 2021-03-29 DIAGNOSIS — Z720 Tobacco use: Secondary | ICD-10-CM

## 2021-03-29 DIAGNOSIS — E1165 Type 2 diabetes mellitus with hyperglycemia: Secondary | ICD-10-CM

## 2021-03-29 DIAGNOSIS — E1142 Type 2 diabetes mellitus with diabetic polyneuropathy: Secondary | ICD-10-CM

## 2021-03-29 DIAGNOSIS — Z794 Long term (current) use of insulin: Secondary | ICD-10-CM

## 2021-03-29 LAB — POCT FASTING CBG KUC MANUAL ENTRY: POCT Glucose (KUC): 206 mg/dL — AB (ref 70–99)

## 2021-03-29 MED ORDER — ACCU-CHEK SOFTCLIX LANCETS MISC: 6 refills | Status: DC

## 2021-03-29 MED ORDER — BD PEN NEEDLE SHORT U/F 31G X 8 MM MISC: 1.0000 "application " | Freq: Every day | 3 refills | Status: DC

## 2021-03-29 MED ORDER — BASAGLAR KWIKPEN 100 UNIT/ML ~~LOC~~ SOPN: 80.0000 [IU] | PEN_INJECTOR | Freq: Every day | SUBCUTANEOUS | 0 refills | Status: DC

## 2021-03-29 MED ORDER — NOVOLOG FLEXPEN 100 UNIT/ML ~~LOC~~ SOPN: PEN_INJECTOR | SUBCUTANEOUS | 0 refills | Status: DC

## 2021-03-29 MED ORDER — BENZONATATE 100 MG PO CAPS
100.0000 mg | ORAL_CAPSULE | Freq: Three times a day (TID) | ORAL | 0 refills | Status: DC | PRN
Start: 2021-03-29 — End: 2021-05-26

## 2021-03-29 NOTE — ED Provider Notes (Signed)
[facility]    CSN: [id number] Arrival date & time: 03/29/21  1351      History   Chief Complaint Chief Complaint  Patient presents with  . Cough    HPI Sarah Long is a 50 y.o. female.   Sarah Long presents with complaints of cough which started yesterday. Occasionally productive of mucus but primarily feels "like a tickle" which she cannot satisfy. Causing her chest to be sore due to the frequency of coughing. No specific shortness of breath. Has recently been dealing with some allergy symptoms but no other congestion, headache, fevers, sore throat or gi symptoms. No lower extremity swelling. No other chest pain or palpitations. She does smoke. Cough is worse with laying flat. History of DM, bipolar, chf, pancreatitis and pacemaker. She has not taken any of her medications in a few months.     ROS per HPI, negative if not otherwise mentioned.      Past Medical History:  Diagnosis Date  . Abnormal Pap smear 1998  . Abnormal vaginal bleeding 12/19/2011  . Bacterial infection   . Bipolar 1 disorder ([place])   . Blister of second toe of left foot 11/21/2016  . Candida vaginitis 07/2007  . Depression    recently added wellbutrin-has not taken yet for bipolar  . Diabetes in pregnancy   . Diabetes mellitus    nph 20U qam and qpm, regular with meals  . Diabetic ketoacidosis without coma associated with type 2 diabetes mellitus ([place])   . Fibroid   . Galactorrhea of right breast 2008  . H/O amenorrhea 07/2007  . H/O dizziness 10/14/11  . H/O dysmenorrhea 2010  . H/O menorrhagia 10/14/11  . H/O varicella   . Headache(784.0)   . Heavy vaginal bleeding due to contraceptive injection use 10/12/2011   Depo Provera  . Herpes   . HSV-2 infection 01/03/2009  . Hx: UTI (urinary tract infection) 2009  . Hypertension    on aldomet  . Increased BMI 2010  . Irregular uterine bleeding 04/04/2012   Pt has mirena   . Obesity 10/14/11  . Oligomenorrhea 07/2007  . Pelvic pain  in female   . Preterm labor   . Trichomonas   . Yeast infection     Patient Active Problem List   Diagnosis Date Noted  . Bacterial vaginosis 07/17/2020  . Vaginal discharge 07/16/2020  . Dental caries 04/01/2020  . Cracked tooth 04/01/2020  . Alcohol use 03/11/2020  . Vitamin D deficiency 03/11/2020  . Prolonged QT interval 02/26/2020  . Chronic combined systolic and diastolic CHF (congestive heart failure) ([place]) 02/26/2020  . Heart block AV complete ([place]) 02/11/2020  . Acute pancreatitis without infection or necrosis   . Dysmenorrhea 10/02/2019  . DM type 2 with diabetic peripheral neuropathy ([place]) 05/30/2019  . 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 block) 05/29/2019  . Bilateral bunions 05/29/2019  . Diabetes type 2, uncontrolled ([place]) 10/21/2016  . Smoking 11/26/2013  . Hypercholesteremia 02/02/2007  . Bipolar 1 disorder ([place]) 02/02/2007  . HYPERTENSION, BENIGN SYSTEMIC 02/02/2007    Past Surgical History:  Procedure Laterality Date  . CESAREAN SECTION  1991  . LEFT HEART CATH AND CORONARY ANGIOGRAPHY N/A 02/10/2020   Procedure: LEFT HEART CATH AND CORONARY ANGIOGRAPHY;  Surgeon: Troy Sine, MD;  Location: [facility];  Service: Cardiovascular;  Laterality: N/A;  . PACEMAKER IMPLANT N/A 02/13/2020   Procedure: PACEMAKER IMPLANT;  Surgeon: Constance Haw, MD;  Location: [facility]  INVASIVE [facility];  Service: Cardiovascular;  Laterality: N/A;  . TEMPORARY PACEMAKER N/A 02/10/2020   Procedure: TEMPORARY PACEMAKER;  Surgeon: Troy Sine, MD;  Location: [facility];  Service: Cardiovascular;  Laterality: N/A;    OB History    Gravida  6   Para  4   Term  1   Preterm  3   AB  2   Living  4     SAB  1   IAB  1   Ectopic  0   Multiple  0   Live Births  3            Home Medications    Prior to Admission medications   Medication Sig Start Date End Date Taking? Authorizing Provider  benzonatate (TESSALON) 100  MG capsule Take 1-2 capsules (100-200 mg total) by mouth 3 (three) times daily as needed for cough. 03/29/21  Yes Augusto Gamble B, NP  Accu-Chek Softclix Lancets lancets Use as instructed to check blood sugar three times daily. E11.65 03/29/21   Zigmund Gottron, NP  Blood Glucose Monitoring Suppl (ACCU-CHEK GUIDE ME) w/Device KIT 1 kit by Does not apply route in the morning, at noon, and at bedtime. Use as instructed to check blood sugar three times daily. E11.65 03/20/20   [name], MD  cyclobenzaprine (FLEXERIL) 5 MG tablet Take 1-2 tablets (5-10 mg total) by mouth 2 (two) times daily as needed for muscle spasms. 01/25/21   Wieters, Hallie C, PA-C  diphenhydrAMINE (BENADRYL) 25 mg capsule Take 25 mg by mouth every 6 (six) hours as needed for allergies.    [provider]  glucose blood (ACCU-CHEK GUIDE) test strip Use as instructed to check blood sugar three times daily. E11.65 03/20/20   [name], MD  glucose blood test strip Check your sugar in the morning before you eat breakfast, and one hour after a meal. 03/09/21   Melynda Ripple, MD  glucose monitoring kit (FREESTYLE) monitoring kit 1 each by Does not apply route daily. Check glucose once in the morning before breakfast and 1 hour after a meal 03/09/21   Melynda Ripple, MD  insulin aspart (NOVOLOG FLEXPEN) 100 UNIT/ML FlexPen 10 unit with meals Sliding scale three times daily: 0-150: 0 151-200: 2 units  201-250: 4 units  251-300: 6 units  301-350: 8 units  351- 400 10 units   >400  12 units 03/29/21   Augusto Gamble B, NP  Insulin Glargine (BASAGLAR KWIKPEN) 100 UNIT/ML Inject 80 Units into the skin daily. 03/29/21 05/08/21  Augusto Gamble B, NP  Insulin Pen Needle (B-D ULTRAFINE III SHORT PEN) 31G X 8 MM MISC 1 application by Does not apply route daily. 03/29/21   Zigmund Gottron, NP  Lancet Devices (ADJUSTABLE LANCING DEVICE) MISC Inject 1 each into the skin 3 (three) times daily. 10/17/18   Clent Demark, PA-C   LATUDA 40 MG TABS tablet SMARTSIG:1 Tablet(s) By Mouth Every Evening 04/15/20   [provider]  naproxen (NAPROSYN) 500 MG tablet Take 1 tablet (500 mg total) by mouth 2 (two) times daily. 01/25/21   Wieters, Hallie C, PA-C  ondansetron (ZOFRAN) 4 MG tablet Take 1 tablet (4 mg total) by mouth every 8 (eight) hours as needed for nausea or vomiting. 05/13/20   [name], MD  Pancrelipase, Lip-Prot-Amyl, 24000-76000 units CPEP Take 1 capsule (24,000 Units total) by mouth 3 (three) times daily with meals. 07/16/20   [name], MD  phenazopyridine (PYRIDIUM) 100 MG tablet Take 2 tablets (200 mg total) by mouth 3 (three) times daily as needed for pain. 03/09/21   Melynda Ripple, MD  rosuvastatin (CRESTOR) 10 MG tablet Take 1 tablet (10 mg total) by mouth daily. 04/29/20 07/28/20  Larey Dresser, MD  sacubitril-valsartan (ENTRESTO) 49-51 MG Take 1 tablet by mouth 2 (two) times daily. 05/22/20   Larey Dresser, MD  thiamine 100 MG tablet Take 1 tablet (100 mg total) by mouth daily. 07/16/20   [name], MD  varenicline (CHANTIX STARTING MONTH PAK) 0.5 MG X 11 & 1 MG X 42 tablet Take one 0.5 mg tablet by mouth once daily for 3 days, then increase to one 0.5 mg tablet twice daily for 4 days, then increase to one 1 mg tablet twice daily. 05/22/20   Larey Dresser, MD    Family History Family History  Problem Relation Age of Onset  . Hypertension Mother   . Diabetes Mother   . Heart disease Mother   . Hypertension Father   . Diabetes Father   . Heart disease Father   . Stroke Maternal Grandfather   . Other Neg Hx     Social History Social History   Tobacco Use  . Smoking status: Former Smoker    Packs/day: 0.50    Years: 20.00    Pack years: 10.00    Types: Cigarettes  . Smokeless tobacco: Never Used  Vaping Use  . Vaping Use: Never used  Substance Use Topics  . Alcohol use: No  . Drug use: No     Allergies   Strawberry extract and Sulfa  antibiotics   Review of Systems Review of Systems   Physical Exam Triage Vital Signs ED Triage Vitals  Enc Vitals Group     BP 03/29/21 1405 137/83     Pulse Rate 03/29/21 1405 90     Resp 03/29/21 1405 20     Temp 03/29/21 1405 98.5 F (36.9 C)     Temp Source 03/29/21 1405 Oral     SpO2 03/29/21 1405 99 %     Weight --      Height --      Head Circumference --      Peak Flow --      Pain Score 03/29/21 1410 7     Pain Loc --      Pain Edu? --      Excl. in [place]? --    No data found.  Updated Vital Signs BP 137/83 (BP Location: Left Arm)   Pulse 90   Temp 98.5 F (36.9 C) (Oral)   Resp 20   LMP 03/16/2021   SpO2 99%   Visual Acuity Right Eye Distance:   Left Eye Distance:   Bilateral Distance:    Right Eye Near:   Left Eye Near:    Bilateral Near:     Physical Exam Constitutional:      General: She is not in acute distress.    Appearance: She is well-developed.  HENT:     Head: Normocephalic and atraumatic.     Mouth/Throat:     Mouth: Mucous membranes are moist.  Eyes:     Pupils: Pupils are equal, round, and reactive to light.  Cardiovascular:     Rate and Rhythm: Normal rate.  Pulmonary:     Effort: Pulmonary effort is normal.     Breath sounds: Normal breath sounds.     Comments: Frequent dry cough noted  Musculoskeletal:     Right lower leg: No edema.     Left lower leg: No edema.  Skin:    General: Skin is warm and dry.  Neurological:     Mental Status: She is alert and oriented to person, place, and time.      UC Treatments / Results  Labs (all labs ordered are listed, but only abnormal results are displayed) Labs Reviewed  POCT FASTING CBG [facility] MANUAL ENTRY - Abnormal; Notable for the following components:      Result Value   POCT Glucose ([facility]) 206 (*)    All other components within normal limits  CBC WITH DIFFERENTIAL/PLATELET  COMPREHENSIVE METABOLIC PANEL  BRAIN NATRIURETIC PEPTIDE    EKG   Radiology DG Chest 2  View  Result Date: 03/29/2021 CLINICAL DATA:  Cough, smoker. EXAM: CHEST - 2 VIEW COMPARISON:  Chest radiograph dated 01/03/2021 FINDINGS: The heart is enlarged. A left subclavian approach cardiac device is redemonstrated. The lungs are clear. The osseous structures are intact. IMPRESSION: Cardiomegaly.  Clear lungs. Electronically Signed   By: Zerita Boers M.D.   On: 03/29/2021 14:50    Procedures Procedures (including critical care time)  Medications Ordered in UC Medications - No data to display  Initial Impression / Assessment and Plan / UC Course  I have reviewed the triage vital signs and the nursing notes.  Pertinent labs & imaging results that were available during my care of the patient were reviewed by me and considered in my medical decision making (see chart for details).     Cough since yesterday. Given patient's history, chest xray obtained and without acute findings. Vitals stable. Patient hasn't been taking any medications in the past two months. On chart review it does appear she should have refills available, but diabetes medications resent tonight with baseline labs obtained as well. Supportive cares recommended for cough at this time. Emphasized follow up with her pcp for long term management and recheck. Patient verbalized understanding and agreeable to plan.  Ambulatory out of clinic without difficulty.    Final Clinical Impressions(s) / UC Diagnoses   Final diagnoses:  Cough  Chronic combined systolic and diastolic CHF (congestive heart failure) ([place])  Uncontrolled type 2 diabetes mellitus with hyperglycemia ([facility])  Medication refill     Discharge Instructions     Your chest xray looks well.  We will call you if there are otherwise any concerning findings with your lab tests.  Tessalon as needed for cough, mucinex may also be helpful.  Please refill your medications, I have sent refills, and it looks like you should have remaining refills already, on your  medications.  Please call tomorrow to set up appointment for recheck and follow up with your chronic medications.  Return or go to the ER for any worsening of symptoms.      ED Prescriptions    Medication Sig Dispense Auth. Provider   benzonatate (TESSALON) 100 MG capsule Take 1-2 capsules (100-200 mg total) by mouth 3 (three) times daily as needed for cough. 21 capsule Alexza Norbeck B, NP   insulin aspart (NOVOLOG FLEXPEN) 100 UNIT/ML FlexPen 10 unit with meals Sliding scale three times daily: 0-150: 0 151-200: 2 units  201-250: 4 units  251-300: 6 units  301-350: 8 units  351- 400 10 units   >400  12 units 15 mL Alain Deschene B, NP   Insulin Glargine (BASAGLAR KWIKPEN) 100 UNIT/ML  (Status: Discontinued) Inject 80 Units into the skin daily. 32 mL  Augusto Gamble B, NP   Insulin Pen Needle (B-D ULTRAFINE III SHORT PEN) 31G X 8 MM MISC 1 application by Does not apply route daily. 90 each Augusto Gamble B, NP   Insulin Glargine (BASAGLAR KWIKPEN) 100 UNIT/ML Inject 80 Units into the skin daily. 32 mL Augusto Gamble B, NP   Accu-Chek Softclix Lancets lancets Use as instructed to check blood sugar three times daily. E11.65 100 each Zigmund Gottron, NP     PDMP not reviewed this encounter.   Zigmund Gottron, NP 03/29/21 [phone]

## 2021-03-29 NOTE — ED Triage Notes (Signed)
Onset last night of symptoms.  During the night started coughing.  This afternoon, patient is coughing so much, she gags

## 2021-03-29 NOTE — Discharge Instructions (Addendum)
Your chest xray looks well.  We will call you if there are otherwise any concerning findings with your lab tests.  Tessalon as needed for cough, mucinex may also be helpful.  Please refill your medications, I have sent refills, and it looks like you should have remaining refills already, on your medications.  Please call tomorrow to set up appointment for recheck and follow up with your chronic medications.  Return or go to the ER for any worsening of symptoms.

## 2021-03-31 LAB — COMPREHENSIVE METABOLIC PANEL
ALT: 19 IU/L (ref 0–32)
AST: 22 IU/L (ref 0–40)
Albumin/Globulin Ratio: 1.3 (ref 1.2–2.2)
Albumin: 3.7 g/dL — ABNORMAL LOW (ref 3.8–4.8)
Alkaline Phosphatase: 88 IU/L (ref 44–121)
BUN/Creatinine Ratio: 17 (ref 9–23)
BUN: 15 mg/dL (ref 6–24)
Bilirubin Total: 0.2 mg/dL (ref 0.0–1.2)
CO2: 18 mmol/L — ABNORMAL LOW (ref 20–29)
Calcium: 8.9 mg/dL (ref 8.7–10.2)
Chloride: 101 mmol/L (ref 96–106)
Creatinine, Ser: 0.86 mg/dL (ref 0.57–1.00)
Globulin, Total: 2.9 g/dL (ref 1.5–4.5)
Glucose: 225 mg/dL — ABNORMAL HIGH (ref 65–99)
Potassium: 4.5 mmol/L (ref 3.5–5.2)
Sodium: 133 mmol/L — ABNORMAL LOW (ref 134–144)
Total Protein: 6.6 g/dL (ref 6.0–8.5)
eGFR: 84 mL/min/{1.73_m2} (ref >59)

## 2021-03-31 LAB — CBC WITH DIFFERENTIAL/PLATELET
Basophils Absolute: 0 10*3/uL (ref 0.0–0.2)
Basos: 0 %
EOS (ABSOLUTE): 0.1 10*3/uL (ref 0.0–0.4)
Eos: 1 %
Hematocrit: 32.5 % — ABNORMAL LOW (ref 34.0–46.6)
Hemoglobin: 10.5 g/dL — ABNORMAL LOW (ref 11.1–15.9)
Immature Grans (Abs): 0 10*3/uL (ref 0.0–0.1)
Immature Granulocytes: 0 %
Lymphocytes Absolute: 0.9 10*3/uL (ref 0.7–3.1)
Lymphs: 13 %
MCH: 29.8 pg (ref 26.6–33.0)
MCHC: 32.3 g/dL (ref 31.5–35.7)
MCV: 92 fL (ref 79–97)
Monocytes Absolute: 0.3 10*3/uL (ref 0.1–0.9)
Monocytes: 5 %
Neutrophils Absolute: 5.8 10*3/uL (ref 1.4–7.0)
Neutrophils: 81 %
Platelets: 216 10*3/uL (ref 150–450)
RBC: 3.52 x10E6/uL — ABNORMAL LOW (ref 3.77–5.28)
RDW: 13.3 % (ref 11.7–15.4)
WBC: 7.1 10*3/uL (ref 3.4–10.8)

## 2021-03-31 LAB — BRAIN NATRIURETIC PEPTIDE: BNP: 261.8 pg/mL — ABNORMAL HIGH (ref 0.0–100.0)

## 2021-04-08 ENCOUNTER — Ambulatory Visit: Payer: Medicaid Other

## 2021-04-14 ENCOUNTER — Ambulatory Visit: Payer: Medicaid Other | Admitting: Sports Medicine

## 2021-04-17 ENCOUNTER — Other Ambulatory Visit: Payer: Self-pay

## 2021-04-17 MED ORDER — HYDROXYZINE HCL 25 MG PO TABS
25.0000 mg | ORAL_TABLET | Freq: Every evening | ORAL | 1 refills | Status: DC | PRN
  Filled 2021-04-17 – 2021-05-20 (×2): qty 30, 30d supply, fill #0

## 2021-04-17 MED ORDER — LATUDA 40 MG PO TABS
ORAL_TABLET | ORAL | 1 refills | Status: DC
  Filled 2021-04-17 – 2021-05-20 (×2): qty 30, 30d supply, fill #0

## 2021-04-24 ENCOUNTER — Other Ambulatory Visit: Payer: Self-pay

## 2021-04-29 ENCOUNTER — Ambulatory Visit: Payer: Medicaid Other

## 2021-05-01 ENCOUNTER — Other Ambulatory Visit: Payer: Self-pay

## 2021-05-06 ENCOUNTER — Telehealth: Payer: Self-pay | Admitting: Emergency Medicine

## 2021-05-06 LAB — CUP PACEART REMOTE DEVICE CHECK
Battery Remaining Longevity: 121 mo
Battery Remaining Percentage: 95.5 %
Battery Voltage: 3.01 V
Brady Statistic AP VP Percent: 1 %
Brady Statistic AP VS Percent: 1 %
Brady Statistic AS VP Percent: 99 %
Brady Statistic AS VS Percent: 1 %
Brady Statistic RA Percent Paced: 1 %
Brady Statistic RV Percent Paced: 99 %
Date Time Interrogation Session: 20220504031346
Implantable Lead Implant Date: 20210310
Implantable Lead Implant Date: 20210310
Implantable Lead Location: 753859
Implantable Lead Location: 753860
Implantable Pulse Generator Implant Date: 20210310
Lead Channel Impedance Value: 480 Ohm
Lead Channel Impedance Value: 490 Ohm
Lead Channel Pacing Threshold Amplitude: 0.625 V
Lead Channel Pacing Threshold Amplitude: 0.75 V
Lead Channel Pacing Threshold Pulse Width: 0.5 ms
Lead Channel Pacing Threshold Pulse Width: 0.5 ms
Lead Channel Sensing Intrinsic Amplitude: 12 mV
Lead Channel Sensing Intrinsic Amplitude: 5 mV
Lead Channel Setting Pacing Amplitude: 0.875
Lead Channel Setting Pacing Amplitude: 1.75 V
Lead Channel Setting Pacing Pulse Width: 0.5 ms
Lead Channel Setting Sensing Sensitivity: 2 mV
Pulse Gen Model: 2272
Pulse Gen Serial Number: 3802095

## 2021-05-06 NOTE — Telephone Encounter (Signed)
Patient overdue for follow-up with Dr Curt Bears .

## 2021-05-07 ENCOUNTER — Other Ambulatory Visit: Payer: Self-pay

## 2021-05-12 ENCOUNTER — Telehealth: Payer: Self-pay | Admitting: *Deleted

## 2021-05-12 NOTE — Telephone Encounter (Signed)
Left message to call back  

## 2021-05-12 NOTE — Telephone Encounter (Signed)
-----   Message from Will Meredith Leeds, MD sent at 05/08/2021  7:25 AM EDT ----- Abnormal device interrogation reviewed.  Lead parameters and battery status stable.  NSVT, start toprol xl 50 mg.

## 2021-05-19 ENCOUNTER — Ambulatory Visit
Admission: EM | Admit: 2021-05-19 | Discharge: 2021-05-19 | Disposition: A | Discharge status: Home / Self Care | Payer: Medicaid Other | Attending: Family Medicine | Admitting: Family Medicine

## 2021-05-19 ENCOUNTER — Emergency Department (HOSPITAL_COMMUNITY)
Admission: EM | Admit: 2021-05-19 | Discharge: 2021-05-19 | Discharge status: Against Medical Advice | Payer: Medicaid Other

## 2021-05-19 ENCOUNTER — Other Ambulatory Visit: Payer: Self-pay

## 2021-05-19 DIAGNOSIS — L089 Local infection of the skin and subcutaneous tissue, unspecified: Secondary | ICD-10-CM | POA: Insufficient documentation

## 2021-05-19 DIAGNOSIS — E11628 Type 2 diabetes mellitus with other skin complications: Secondary | ICD-10-CM

## 2021-05-19 DIAGNOSIS — E1165 Type 2 diabetes mellitus with hyperglycemia: Secondary | ICD-10-CM | POA: Insufficient documentation

## 2021-05-19 DIAGNOSIS — N390 Urinary tract infection, site not specified: Secondary | ICD-10-CM

## 2021-05-19 LAB — POCT URINALYSIS DIP (MANUAL ENTRY)
Bilirubin, UA: NEGATIVE
Glucose, UA: 500 mg/dL — AB
Ketones, POC UA: NEGATIVE mg/dL
Nitrite, UA: POSITIVE — AB
Protein Ur, POC: 100 mg/dL — AB
Spec Grav, UA: 1.015 (ref 1.010–1.025)
Urobilinogen, UA: 0.2 E.U./dL
pH, UA: 5.5 (ref 5.0–8.0)

## 2021-05-19 LAB — POCT FASTING CBG KUC MANUAL ENTRY: POCT Glucose (KUC): 331 mg/dL — AB (ref 70–99)

## 2021-05-19 MED ORDER — NYSTATIN 500000 UNITS PO TABS
500000.0000 [IU] | ORAL_TABLET | Freq: Two times a day (BID) | ORAL | 0 refills | Status: AC
  Filled 2021-05-19: qty 20, 10d supply, fill #0

## 2021-05-19 MED ORDER — DOXYCYCLINE HYCLATE 100 MG PO CAPS
100.0000 mg | ORAL_CAPSULE | Freq: Two times a day (BID) | ORAL | 0 refills | Status: DC
  Filled 2021-05-19: qty 20, 10d supply, fill #0

## 2021-05-19 NOTE — Discharge Instructions (Addendum)
You will be contacted by vascular for further evaluation and work-up of the source of your feet ulceration related to diabetes.  Start doxycycline twice daily for total of 10 days.  This will treat your urinary tract infection and prevent any further infection of your feet.  I am also placing you on nystatin oral medication this is to help with any underlying fungal infection which may be attributing to your feet blistering.

## 2021-05-19 NOTE — ED Notes (Signed)
Patient states she is going to urgent care in the morning

## 2021-05-19 NOTE — ED Provider Notes (Signed)
[facility]    CSN: [id number] Arrival date & time: 05/19/21  0919      History   Chief Complaint Chief Complaint  Patient presents with   Blister    Foot blisters    HPI Sarah Long is a 50 y.o. female.   HPI Patient with a known history of type 2 diabetes uncontrolled, diastolic heart failure, peripheral neuropathy presents today concerned that both of her feet are blistering with complete skin avulsion of the left great toe and purulent drainage excreting from both feet.  She denies known injury.  She has no sensation or feeling in her feet.  She is unaware of having a previous vascular consult.  She noticed this on yesterday and grew concerned.  She does not check her sugars at home.  At her visit here today her blood sugar is 331.  She ate prior to her arrival here and had not administered any of her medications.  She also went to the ER earlier this morning however due to the wait left without being seen.   Past Medical History:  Diagnosis Date   Abnormal Pap smear 1998   Abnormal vaginal bleeding 12/19/2011   Bacterial infection    Bipolar 1 disorder (HCC)    Blister of second toe of left foot 11/21/2016   Candida vaginitis 07/2007   Depression    recently added wellbutrin-has not taken yet for bipolar   Diabetes in pregnancy    Diabetes mellitus    nph 20U qam and qpm, regular with meals   Diabetic ketoacidosis without coma associated with type 2 diabetes mellitus ([place])    Fibroid    Galactorrhea of right breast 2008   H/O amenorrhea 07/2007   H/O dizziness 10/14/11   H/O dysmenorrhea 2010   H/O menorrhagia 10/14/11   H/O varicella    Headache(784.0)    Heavy vaginal bleeding due to contraceptive injection use 10/12/2011   Depo Provera   Herpes    HSV-2 infection 01/03/2009   Hx: UTI (urinary tract infection) 2009   Hypertension    on aldomet   Increased BMI 2010   Irregular uterine bleeding 04/04/2012   Pt has mirena    Obesity 10/14/11    Oligomenorrhea 07/2007   Pelvic pain in female    Preterm labor    Trichomonas    Yeast infection     Patient Active Problem List   Diagnosis Date Noted   Bacterial vaginosis 07/17/2020   Vaginal discharge 07/16/2020   Dental caries 04/01/2020   Cracked tooth 04/01/2020   Alcohol use 03/11/2020   Vitamin D deficiency 03/11/2020   Prolonged QT interval 02/26/2020   Chronic combined systolic and diastolic CHF (congestive heart failure) ([place]) 02/26/2020   Heart block AV complete ([place]) 02/11/2020   Acute pancreatitis without infection or necrosis    Dysmenorrhea 10/02/2019   DM type 2 with diabetic peripheral neuropathy ([place]) 05/30/2019   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 block) 05/29/2019   Bilateral bunions 05/29/2019   Diabetes type 2, uncontrolled ([place]) 10/21/2016   Smoking 11/26/2013   Hypercholesteremia 02/02/2007   Bipolar 1 disorder ([place]) 02/02/2007   HYPERTENSION, BENIGN SYSTEMIC 02/02/2007    Past Surgical History:  Procedure Laterality Date   CESAREAN SECTION  1991   LEFT HEART CATH AND CORONARY ANGIOGRAPHY N/A 02/10/2020   Procedure: LEFT HEART CATH AND CORONARY ANGIOGRAPHY;  Surgeon: Troy Sine, MD;  Location: [facility];  Service: Cardiovascular;  Laterality:  N/A;   PACEMAKER IMPLANT N/A 02/13/2020   Procedure: PACEMAKER IMPLANT;  Surgeon: Constance Haw, MD;  Location: [facility];  Service: Cardiovascular;  Laterality: N/A;   TEMPORARY PACEMAKER N/A 02/10/2020   Procedure: TEMPORARY PACEMAKER;  Surgeon: Troy Sine, MD;  Location: [facility];  Service: Cardiovascular;  Laterality: N/A;    OB History     Gravida  6   Para  4   Term  1   Preterm  3   AB  2   Living  4      SAB  1   IAB  1   Ectopic  0   Multiple  0   Live Births  3            Home Medications    Prior to Admission medications   Medication Sig Start Date End Date Taking? Authorizing Provider  Accu-Chek  Softclix Lancets lancets Use as instructed to check blood sugar three times daily. E11.65 03/29/21   Zigmund Gottron, NP  benzonatate (TESSALON) 100 MG capsule Take 1-2 capsules (100-200 mg total) by mouth 3 (three) times daily as needed for cough. 03/29/21   Zigmund Gottron, NP  Blood Glucose Monitoring Suppl (ACCU-CHEK GUIDE ME) w/Device KIT 1 kit by Does not apply route in the morning, at noon, and at bedtime. Use as instructed to check blood sugar three times daily. E11.65 03/20/20   [name], MD  cyclobenzaprine (FLEXERIL) 5 MG tablet Take 1-2 tablets (5-10 mg total) by mouth 2 (two) times daily as needed for muscle spasms. 01/25/21   Wieters, Hallie C, PA-C  diphenhydrAMINE (BENADRYL) 25 mg capsule Take 25 mg by mouth every 6 (six) hours as needed for allergies.    [provider]  glucose blood (ACCU-CHEK GUIDE) test strip Use as instructed to check blood sugar three times daily. E11.65 03/20/20   [name], MD  glucose blood test strip Check your sugar in the morning before you eat breakfast, and one hour after a meal. 03/09/21   Melynda Ripple, MD  glucose monitoring kit (FREESTYLE) monitoring kit 1 each by Does not apply route daily. Check glucose once in the morning before breakfast and 1 hour after a meal 03/09/21   Melynda Ripple, MD  hydrOXYzine (ATARAX/VISTARIL) 25 MG tablet Take 1 tablet by mouth at bedtime as needed for sleep 04/16/21     insulin aspart (NOVOLOG FLEXPEN) 100 UNIT/ML FlexPen 10 unit with meals Sliding scale three times daily: 0-150: 0 151-200: 2 units  201-250: 4 units  251-300: 6 units  301-350: 8 units  351- 400 10 units   >400  12 units 03/29/21   Augusto Gamble B, NP  Insulin Glargine (BASAGLAR KWIKPEN) 100 UNIT/ML Inject 80 Units into the skin daily. 03/29/21 05/08/21  Augusto Gamble B, NP  Insulin Pen Needle (B-D ULTRAFINE III SHORT PEN) 31G X 8 MM MISC 1 application by Does not apply route daily. 03/29/21   Zigmund Gottron, NP  Lancet Devices  (ADJUSTABLE LANCING DEVICE) MISC Inject 1 each into the skin 3 (three) times daily. 10/17/18   Clent Demark, PA-C  LATUDA 40 MG TABS tablet SMARTSIG:1 Tablet(s) By Mouth Every Evening 04/15/20   [provider]  lurasidone (LATUDA) 40 MG TABS tablet Take 1 tablet by mouth every evening with meals to start on or after 04/30/21 04/16/21     naproxen (NAPROSYN) 500 MG tablet Take 1 tablet (500 mg total) by mouth  2 (two) times daily. 01/25/21   Wieters, Hallie C, PA-C  ondansetron (ZOFRAN) 4 MG tablet Take 1 tablet (4 mg total) by mouth every 8 (eight) hours as needed for nausea or vomiting. 05/13/20   [name], MD  Pancrelipase, Lip-Prot-Amyl, 24000-76000 units CPEP Take 1 capsule (24,000 Units total) by mouth 3 (three) times daily with meals. 07/16/20   [name], MD  phenazopyridine (PYRIDIUM) 100 MG tablet Take 2 tablets (200 mg total) by mouth 3 (three) times daily as needed for pain. 03/09/21   Melynda Ripple, MD  rosuvastatin (CRESTOR) 10 MG tablet Take 1 tablet (10 mg total) by mouth daily. 04/29/20 07/28/20  Larey Dresser, MD  sacubitril-valsartan (ENTRESTO) 49-51 MG Take 1 tablet by mouth 2 (two) times daily. 05/22/20   Larey Dresser, MD  thiamine 100 MG tablet Take 1 tablet (100 mg total) by mouth daily. 07/16/20   [name], MD  varenicline (CHANTIX STARTING MONTH PAK) 0.5 MG X 11 & 1 MG X 42 tablet Take one 0.5 mg tablet by mouth once daily for 3 days, then increase to one 0.5 mg tablet twice daily for 4 days, then increase to one 1 mg tablet twice daily. 05/22/20   Larey Dresser, MD    Family History Family History  Problem Relation Age of Onset   Hypertension Mother    Diabetes Mother    Heart disease Mother    Hypertension Father    Diabetes Father    Heart disease Father    Stroke Maternal Grandfather    Other Neg Hx     Social History Social History   Tobacco Use   Smoking status: Former    Packs/day: 0.50    Years: 20.00    Pack  years: 10.00    Types: Cigarettes   Smokeless tobacco: Never  Vaping Use   Vaping Use: Never used  Substance Use Topics   Alcohol use: No   Drug use: No     Allergies   Strawberry extract and Sulfa antibiotics   Review of Systems Review of Systems Pertinent negatives listed in HPI   Physical Exam Triage Vital Signs ED Triage Vitals  Enc Vitals Group     BP 05/19/21 1121 (!) 142/85     Pulse Rate 05/19/21 1121 84     Resp 05/19/21 1121 16     Temp 05/19/21 1121 98.4 F (36.9 C)     Temp Source 05/19/21 1121 Oral     SpO2 05/19/21 1121 99 %     Weight --      Height --      Head Circumference --      Peak Flow --      Pain Score 05/19/21 1118 0     Pain Loc --      Pain Edu? --      Excl. in [place]? --    No data found.  Updated Vital Signs BP (!) 142/85 (BP Location: Left Arm)   Pulse 84   Temp 98.4 F (36.9 C) (Oral)   Resp 16   LMP 05/16/2021   SpO2 99%   Visual Acuity Right Eye Distance:   Left Eye Distance:   Bilateral Distance:    Right Eye Near:   Left Eye Near:    Bilateral Near:     Physical Exam Constitutional:      Appearance: She is not ill-appearing or toxic-appearing.  HENT:     Head: Normocephalic.  Nose: Nose normal.  Cardiovascular:     Rate and Rhythm: Normal rate.     Heart sounds: Normal heart sounds.  Pulmonary:     Effort: Pulmonary effort is normal.     Breath sounds: Normal breath sounds.  Skin:    Capillary Refill: Capillary refill takes less than 2 seconds.  Neurological:     General: No focal deficit present.     Mental Status: She is alert.     Sensory: Sensory deficit present.     Comments: No tactile sensation palmar surface of bilateral feet. Right foot DP pulse faint but present, left foot DP pulse +1  Psychiatric:        Mood and Affect: Mood normal.        Behavior: Behavior normal.        Thought Content: Thought content normal.        Judgment: Judgment normal.                UC  Treatments / Results  Labs (all labs ordered are listed, but only abnormal results are displayed) Labs Reviewed - No data to display  EKG   Radiology No results found.  Procedures Procedures (including critical care time)  Medications Ordered in UC Medications - No data to display  Initial Impression / Assessment and Plan / UC Course  I have reviewed the triage vital signs and the nursing notes.  Pertinent labs & imaging results that were available during my care of the patient were reviewed by me and considered in my medical decision making (see chart for details).    Type 2 diabetes with hyperglycemia patient has a long history of admitted noncompliance.  Concern for possible vascular related disease given the overall appearance of both feet and the diminished pulses bilaterally.  Placing a stat consult for vascular.  Placing patient on doxycycline along with nystatin.  Patient has prolonged QT interval therefore fluconazole is contraindicated.  We will trial of round of nystatin to cover for any underlying candidal infection related to her feet.  Return precautions discussed.  Patient advised to follow-up with vascular given the overall condition of her feet.  Patient is hyperglycemic today blood sugar was 331 however has not taken any medication today and ate prior to her arrival here to urgent care.   Final Clinical Impressions(s) / UC Diagnoses   Final diagnoses:  Type 2 diabetes mellitus with hyperglycemia, unspecified whether long term insulin use ([place])  Diabetic foot infection [facility])   Discharge Instructions   None    ED Prescriptions     Medication Sig Dispense Auth. Provider   doxycycline (VIBRAMYCIN) 100 MG capsule Take 1 capsule (100 mg total) by mouth 2 (two) times daily. 20 capsule Scot Jun, FNP   nystatin (MYCOSTATIN) 500000 units TABS tablet Take 1 tablet (500,000 Units total) by mouth in the morning and at bedtime for 10 days. 20 tablet Scot Jun, FNP      PDMP not reviewed this encounter.   Scot Jun, FNP 05/19/21 1306

## 2021-05-19 NOTE — ED Triage Notes (Signed)
Patient presents to Urgent Care with complaints of bilateral foot blisters she noted yesterday. The blisters have ruptured and she has been cleansing site with peroxide. She is diabetic and states she has a hx of neuropathy and has a baseline of numbness and tingling in feet.   Denies fever.

## 2021-05-20 ENCOUNTER — Other Ambulatory Visit: Payer: Self-pay

## 2021-05-21 LAB — URINE CULTURE
Culture: >=100000 — AB
Special Requests: NORMAL

## 2021-05-22 ENCOUNTER — Telehealth (HOSPITAL_COMMUNITY): Payer: Self-pay | Admitting: Emergency Medicine

## 2021-05-22 ENCOUNTER — Other Ambulatory Visit: Payer: Self-pay

## 2021-05-22 ENCOUNTER — Telehealth: Payer: Self-pay | Admitting: *Deleted

## 2021-05-22 MED ORDER — CEPHALEXIN 500 MG PO CAPS
500.0000 mg | ORAL_CAPSULE | Freq: Two times a day (BID) | ORAL | 0 refills | Status: AC
  Filled 2021-05-22: qty 14, 7d supply, fill #0

## 2021-05-22 NOTE — Telephone Encounter (Signed)
Spoke to patient about upcoming appointment  on 05/25/21. Appointment  reschedule  for 06/09/21 with Dr Fletcher Anon. Patient referral was for [facility] consult.  This would best suit patient. Patient is in agreement.

## 2021-05-25 ENCOUNTER — Other Ambulatory Visit: Payer: Self-pay

## 2021-05-25 ENCOUNTER — Ambulatory Visit: Payer: Medicaid Other | Admitting: Cardiology

## 2021-05-26 ENCOUNTER — Ambulatory Visit (INDEPENDENT_AMBULATORY_CARE_PROVIDER_SITE_OTHER): Payer: Medicaid Other | Admitting: Nurse Practitioner

## 2021-05-26 ENCOUNTER — Other Ambulatory Visit: Payer: Self-pay | Admitting: Nurse Practitioner

## 2021-05-26 ENCOUNTER — Other Ambulatory Visit: Payer: Self-pay

## 2021-05-26 VITALS — BP 135/57 | HR 87 | Temp 97.9°F | Resp 18

## 2021-05-26 DIAGNOSIS — E1165 Type 2 diabetes mellitus with hyperglycemia: Secondary | ICD-10-CM

## 2021-05-26 DIAGNOSIS — E11628 Type 2 diabetes mellitus with other skin complications: Secondary | ICD-10-CM | POA: Diagnosis not present

## 2021-05-26 DIAGNOSIS — L089 Local infection of the skin and subcutaneous tissue, unspecified: Secondary | ICD-10-CM

## 2021-05-26 DIAGNOSIS — E11621 Type 2 diabetes mellitus with foot ulcer: Secondary | ICD-10-CM | POA: Insufficient documentation

## 2021-05-26 MED ORDER — ACCU-CHEK SOFTCLIX LANCETS MISC
6 refills | Status: DC
  Filled 2021-05-26: qty 100, 33d supply, fill #0

## 2021-05-26 MED ORDER — ADJUSTABLE LANCING DEVICE MISC
1.0000 | Freq: Three times a day (TID) | 5 refills | Status: DC
  Filled 2021-05-26: qty 100, fill #0

## 2021-05-26 MED ORDER — BASAGLAR KWIKPEN 100 UNIT/ML ~~LOC~~ SOPN
80.0000 [IU] | PEN_INJECTOR | Freq: Every day | SUBCUTANEOUS | 0 refills | Status: DC
  Filled 2021-05-26: qty 24, 30d supply, fill #0
  Filled 2021-05-28: qty 30, 37d supply, fill #0

## 2021-05-26 MED ORDER — ACCU-CHEK GUIDE VI STRP
ORAL_STRIP | 6 refills | Status: DC
  Filled 2021-05-26: qty 100, 33d supply, fill #0

## 2021-05-26 MED ORDER — NOVOLOG FLEXPEN 100 UNIT/ML ~~LOC~~ SOPN
PEN_INJECTOR | SUBCUTANEOUS | 0 refills | Status: DC
Start: 2021-05-26 — End: 2021-06-18
  Filled 2021-05-26: qty 12, 33d supply, fill #0

## 2021-05-26 MED ORDER — ACCU-CHEK GUIDE W/DEVICE KIT
1.0000 | PACK | Freq: Every day | 0 refills | Status: DC
  Filled 2021-05-26: qty 1, 30d supply, fill #0

## 2021-05-26 NOTE — Patient Instructions (Addendum)
Open Blisters to bilateral feet:  Continue antibiotics as prescribed by UC  Will place referral to wound care   Diabetes:  Continue Basaglar 45 units daily, increase by 2-3 units every 3 days until goal fasting BG <130 or max 70 units daily  Continue Novolog 10 units daily with meals and sliding scale (do not take if not eating full meal)  Will schedule follow up with Lurena Joiner Pharmacist at [facility]    Follow up with PCP

## 2021-05-26 NOTE — Progress Notes (Signed)
$'@Patient'f$  ID: Sarah Long, female    DOB: 1971-06-24, 50 y.o.   MRN: [id number]  Chief Complaint  Patient presents with   Hospitalization Follow-up     Referring provider: [name], MD  HPI  Patient presents today for transition of care visit.  Patient was seen in the ED on 05/19/2021 for uncontrolled diabetes.  She was also seen for blistering to bilateral feet.  She was started on doxycycline and nystatin.  She states that she is doing much better.  Her feet do look much improved today.  She does still have a couple areas around her toes that she is cleaning and bandaging.  We will send her to wound care to be followed for this.    Patient was discharged home on Basaglar and NovoLog as she was taking previously.  Patient states that over the past few months she has not had insurance and was unable to afford her diabetic medications.  She states that she does have medications now.  We will schedule an appointment with the pharmacist community health and wellness to take a look at her diabetic management.  She does have an upcoming appointment to follow-up with Dr. Joya Gaskins scheduled.  Patient has been keeping a check on blood sugars at home and states that they are much improved.  Denies f/c/s, n/v/d, hemoptysis, PND, chest pain or edema        Allergies  Allergen Reactions   Strawberry Extract Hives   Sulfa Antibiotics Hives and Itching    Immunization History  Administered Date(s) Administered   Influenza,inj,Quad PF,6+ Mos 11/26/2013, 12/22/2015, 10/21/2016, 09/28/2017, 10/17/2018, 09/25/2019   PFIZER(Purple Top)SARS-COV-2 Vaccination 04/29/2020, 05/20/2020   Pneumococcal Polysaccharide-23 10/17/2018   Tdap 07/22/2011, 04/29/2012, 01/03/2021    Past Medical History:  Diagnosis Date   Abnormal Pap smear 1998   Abnormal vaginal bleeding 12/19/2011   Bacterial infection    Bipolar 1 disorder ([place])    Blister of second toe of left foot 11/21/2016   Candida vaginitis  07/2007   Depression    recently added wellbutrin-has not taken yet for bipolar   Diabetes in pregnancy    Diabetes mellitus    nph 20U qam and qpm, regular with meals   Diabetic ketoacidosis without coma associated with type 2 diabetes mellitus ([place])    Fibroid    Galactorrhea of right breast 2008   H/O amenorrhea 07/2007   H/O dizziness 10/14/11   H/O dysmenorrhea 2010   H/O menorrhagia 10/14/11   H/O varicella    Headache(784.0)    Heavy vaginal bleeding due to contraceptive injection use 10/12/2011   Depo Provera   Herpes    HSV-2 infection 01/03/2009   Hx: UTI (urinary tract infection) 2009   Hypertension    on aldomet   Increased BMI 2010   Irregular uterine bleeding 04/04/2012   Pt has mirena    Obesity 10/14/11   Oligomenorrhea 07/2007   Pelvic pain in female    Preterm labor    Trichomonas    Yeast infection     Tobacco History: Social History   Tobacco Use  Smoking Status Former   Packs/day: 0.50   Years: 20.00   Pack years: 10.00   Types: Cigarettes  Smokeless Tobacco Never   Counseling given: Yes   Outpatient Encounter Medications as of 05/26/2021  Medication Sig   cephALEXin (KEFLEX) 500 MG capsule Take 1 capsule (500 mg total) by mouth 2 (two) times daily for 7 days.   Blood Glucose Monitoring  Suppl (ACCU-CHEK GUIDE ME) w/Device KIT 1 kit by Does not apply route in the morning, at noon, and at bedtime. Use as instructed to check blood sugar three times daily. E11.65   diphenhydrAMINE (BENADRYL) 25 mg capsule Take 25 mg by mouth every 6 (six) hours as needed for allergies.   doxycycline (VIBRAMYCIN) 100 MG capsule Take 1 capsule (100 mg total) by mouth 2 (two) times daily.   glucose blood (ACCU-CHEK GUIDE) test strip Use as instructed to check blood sugar three times daily. E11.65   glucose blood test strip Check your sugar in the morning before you eat breakfast, and one hour after a meal.   Blood Glucose Monitoring Suppl (ACCU-CHEK GUIDE) w/Device KIT Use  as directed daily   hydrOXYzine (ATARAX/VISTARIL) 25 MG tablet Take 1 tablet by mouth at bedtime as needed for sleep   insulin aspart (NOVOLOG FLEXPEN) 100 UNIT/ML FlexPen 10 unit with meals Sliding scale three times daily: 0-150: 0 151-200: 2 units  201-250: 4 units  251-300: 6 units  301-350: 8 units  351- 400 10 units   >400  12 units   Insulin Glargine (BASAGLAR KWIKPEN) 100 UNIT/ML Inject 80 Units into the skin daily.   Insulin Pen Needle (B-D ULTRAFINE III SHORT PEN) 31G X 8 MM MISC 1 application by Does not apply route daily.   LATUDA 40 MG TABS tablet SMARTSIG:1 Tablet(s) By Mouth Every Evening   lurasidone (LATUDA) 40 MG TABS tablet Take 1 tablet by mouth every evening with meals to start on or after 04/30/21   naproxen (NAPROSYN) 500 MG tablet Take 1 tablet (500 mg total) by mouth 2 (two) times daily.   nystatin (MYCOSTATIN) 500000 units TABS tablet Take 1 tablet (500,000 Units total) by mouth in the morning and at bedtime for 10 days.   ondansetron (ZOFRAN) 4 MG tablet Take 1 tablet (4 mg total) by mouth every 8 (eight) hours as needed for nausea or vomiting.   Pancrelipase, Lip-Prot-Amyl, 24000-76000 units CPEP Take 1 capsule (24,000 Units total) by mouth 3 (three) times daily with meals.   phenazopyridine (PYRIDIUM) 100 MG tablet Take 2 tablets (200 mg total) by mouth 3 (three) times daily as needed for pain.   rosuvastatin (CRESTOR) 10 MG tablet Take 1 tablet (10 mg total) by mouth daily.   sacubitril-valsartan (ENTRESTO) 49-51 MG Take 1 tablet by mouth 2 (two) times daily.   thiamine 100 MG tablet Take 1 tablet (100 mg total) by mouth daily.   varenicline (CHANTIX STARTING MONTH PAK) 0.5 MG X 11 & 1 MG X 42 tablet Take one 0.5 mg tablet by mouth once daily for 3 days, then increase to one 0.5 mg tablet twice daily for 4 days, then increase to one 1 mg tablet twice daily.   [DISCONTINUED] Accu-Chek Softclix Lancets lancets Use as instructed to check blood sugar three times daily. E11.65    [DISCONTINUED] benzonatate (TESSALON) 100 MG capsule Take 1-2 capsules (100-200 mg total) by mouth 3 (three) times daily as needed for cough.   [DISCONTINUED] cyclobenzaprine (FLEXERIL) 5 MG tablet Take 1-2 tablets (5-10 mg total) by mouth 2 (two) times daily as needed for muscle spasms.   [DISCONTINUED] glucose blood (ACCU-CHEK GUIDE) test strip Use as instructed to check blood sugar three times daily. E11.65   [DISCONTINUED] glucose monitoring kit (FREESTYLE) monitoring kit 1 each by Does not apply route daily. Check glucose once in the morning before breakfast and 1 hour after a meal   [DISCONTINUED] insulin aspart (NOVOLOG FLEXPEN)  100 UNIT/ML FlexPen 10 unit with meals Sliding scale three times daily: 0-150: 0 151-200: 2 units  201-250: 4 units  251-300: 6 units  301-350: 8 units  351- 400 10 units   >400  12 units   [DISCONTINUED] Insulin Glargine (BASAGLAR KWIKPEN) 100 UNIT/ML Inject 80 Units into the skin daily.   [DISCONTINUED] Lancet Devices (ADJUSTABLE LANCING DEVICE) MISC Inject 1 each into the skin 3 (three) times daily.   No facility-administered encounter medications on file as of 05/26/2021.     Review of Systems  Review of Systems  Constitutional: Negative.   HENT: Negative.    Respiratory:  Negative for cough and shortness of breath.   Cardiovascular: Negative.   Gastrointestinal: Negative.   Skin:        Open areas from blisters between great toe and second toe bilaterally   Allergic/Immunologic: Negative.   Neurological: Negative.   Psychiatric/Behavioral: Negative.        Physical Exam  BP (!) 135/57   Pulse 87   Temp 97.9 F (36.6 C)   Resp 18   LMP 05/16/2021   SpO2 100%   Wt Readings from Last 5 Encounters:  09/09/20 198 lb (89.8 kg)  07/16/20 189 lb 3.2 oz (85.8 kg)  05/22/20 191 lb 6.4 oz (86.8 kg)  05/20/20 192 lb (87.1 kg)  04/28/20 193 lb (87.5 kg)     Physical Exam Vitals and nursing note reviewed.  Constitutional:      General: She is  not in acute distress.    Appearance: She is well-developed.  Cardiovascular:     Rate and Rhythm: Normal rate and regular rhythm.  Pulmonary:     Effort: Pulmonary effort is normal.     Breath sounds: Normal breath sounds.  Skin:    Comments: Open areas noted between great toe and second toe bilaterally.   Neurological:     Mental Status: She is alert and oriented to person, place, and time.     Lab Results:  CBC    Component Value Date/Time   WBC 7.1 03/29/2021 1507   WBC 10.8 (H) 02/26/2020 0348   RBC 3.52 (L) 03/29/2021 1507   RBC 2.73 (L) 02/26/2020 0348   HGB 10.5 (L) 03/29/2021 1507   HCT 32.5 (L) 03/29/2021 1507   PLT 216 03/29/2021 1507   MCV 92 03/29/2021 1507   MCH 29.8 03/29/2021 1507   MCH 29.3 02/26/2020 0348   MCHC 32.3 03/29/2021 1507   MCHC 31.3 02/26/2020 0348   RDW 13.3 03/29/2021 1507   LYMPHSABS 0.9 03/29/2021 1507   MONOABS 0.7 02/26/2020 0348   EOSABS 0.1 03/29/2021 1507   BASOSABS 0.0 03/29/2021 1507    BMET    Component Value Date/Time   NA 133 (L) 03/29/2021 1507   K 4.5 03/29/2021 1507   CL 101 03/29/2021 1507   CO2 18 (L) 03/29/2021 1507   GLUCOSE 225 (H) 03/29/2021 1507   GLUCOSE 200 (H) 05/08/2020 0823   BUN 15 03/29/2021 1507   CREATININE 0.86 03/29/2021 1507   CREATININE 0.67 10/21/2016 1630   CALCIUM 8.9 03/29/2021 1507   CALCIUM 4.3 02/15/2020 1143   GFRNONAA 73 07/16/2020 0928   GFRNONAA >89 10/21/2016 1630   GFRAA 84 07/16/2020 0928   GFRAA >89 10/21/2016 1630    BNP    Component Value Date/Time   BNP 261.8 (H) 03/29/2021 1507    ProBNP No results found for: PROBNP  Imaging: No results found.   Assessment & Plan:  Diabetic foot infection ([place]) Open Blisters to bilateral feet:  Continue antibiotics as prescribed by UC  Will place referral to wound care  Continue antibiotic as prescribed in ED   Diabetes:  Continue Basaglar 45 units daily, increase by 2-3 units every 3 days until goal fasting BG  <130 or max 70 units daily  Continue Novolog 10 units daily with meals and sliding scale (do not take if not eating full meal)  Will schedule follow up with Lurena Joiner Pharmacist at [facility]    Follow up with PCP     Fenton Foy, NP 05/27/2021

## 2021-05-27 ENCOUNTER — Other Ambulatory Visit: Payer: Self-pay

## 2021-05-27 ENCOUNTER — Encounter (HOSPITAL_BASED_OUTPATIENT_CLINIC_OR_DEPARTMENT_OTHER): Payer: Medicaid Other | Attending: Physician Assistant | Admitting: Physician Assistant

## 2021-05-27 DIAGNOSIS — E11621 Type 2 diabetes mellitus with foot ulcer: Secondary | ICD-10-CM | POA: Insufficient documentation

## 2021-05-27 DIAGNOSIS — E1142 Type 2 diabetes mellitus with diabetic polyneuropathy: Secondary | ICD-10-CM | POA: Insufficient documentation

## 2021-05-27 DIAGNOSIS — L97512 Non-pressure chronic ulcer of other part of right foot with fat layer exposed: Secondary | ICD-10-CM | POA: Diagnosis not present

## 2021-05-27 DIAGNOSIS — L97522 Non-pressure chronic ulcer of other part of left foot with fat layer exposed: Secondary | ICD-10-CM | POA: Insufficient documentation

## 2021-05-27 DIAGNOSIS — E1151 Type 2 diabetes mellitus with diabetic peripheral angiopathy without gangrene: Secondary | ICD-10-CM | POA: Diagnosis not present

## 2021-05-27 LAB — HM DIABETES EYE EXAM

## 2021-05-27 MED ORDER — MUPIROCIN 2 % EX OINT
TOPICAL_OINTMENT | CUTANEOUS | 2 refills | Status: DC
  Filled 2021-05-27: qty 22, 10d supply, fill #0

## 2021-05-27 NOTE — Assessment & Plan Note (Signed)
Open Blisters to bilateral feet:  Continue antibiotics as prescribed by UC  Will place referral to wound care  Continue antibiotic as prescribed in ED   Diabetes:  Continue Basaglar 45 units daily, increase by 2-3 units every 3 days until goal fasting BG <130 or max 70 units daily  Continue Novolog 10 units daily with meals and sliding scale (do not take if not eating full meal)  Will schedule follow up with Lurena Joiner Pharmacist at [facility]    Follow up with PCP

## 2021-05-27 NOTE — Progress Notes (Signed)
Sarah Long ([id number]) Visit Report for 05/27/2021 Allergy List Details Patient Name: Date of Service: Sarah Long, Sarah Long 05/27/2021 7:30 A M Medical Record Number: [id number] Patient Account Number: [id number] Date of Birth/Sex: Treating RN: 05-21-1971 (50 y.o. Female) Lorrin Jackson Primary Care Quantarius Genrich: Asencion Noble Other Clinician: Referring Palmer Fahrner: Treating Dia Donate/Extender: [name] NICHO LS, TO NYA Weeks in Treatment: 0 Allergies Active Allergies strawberry Sulfa (Sulfonamide Antibiotics) Allergy Notes Electronic Signature(s) Signed: 05/27/2021 5:44:30 PM By: Lorrin Jackson Entered By: Lorrin Jackson on 05/27/2021 07:54:18 -------------------------------------------------------------------------------- Arrival Information Details Patient Name: Date of Service: Sarah Haring R. 05/27/2021 7:30 A M Medical Record Number: [id number] Patient Account Number: [id number] Date of Birth/Sex: Treating RN: [birth year] (50 y.o. Female) Lorrin Jackson Primary Care Evon Lopezperez: Asencion Noble Other Clinician: Referring Caera Enwright: Treating Vivian Okelley/Extender: [name] NICHO LS, TO NYA Weeks in Treatment: 0 Visit Information Patient Arrived: Ambulatory Arrival Time: 07:45 Transfer Assistance: None Patient Identification Verified: Yes Secondary Verification Process Completed: Yes Patient Requires Transmission-Based Precautions: No Patient Has Alerts: Yes Patient Alerts: Pacemaker Bilat ABI=[place] Electronic Signature(s) Signed: 05/27/2021 5:44:30 PM By: Lorrin Jackson Entered By: Lorrin Jackson on 05/27/2021 08:28:19 -------------------------------------------------------------------------------- Encounter Discharge Information Details Patient Name: Date of Service: Sarah Haring R. 05/27/2021 7:30 A M Medical Record Number: [id number] Patient Account Number: [id number] Date of Birth/Sex: Treating RN: 1971-11-29 (50 y.o. Female) Sarah Long Primary Care Shakeel Disney:  Asencion Noble Other Clinician: Referring Darshana Curnutt: Treating Kember Boch/Extender: [name] NICHO LS, TO NYA Weeks in Treatment: 0 Encounter Discharge Information Items Post Procedure Vitals Discharge Condition: Stable Temperature (F): 97.8 Ambulatory Status: Ambulatory Pulse (bpm): 74 Discharge Destination: Home Respiratory Rate (breaths/min): 17 Transportation: Private Auto Blood Pressure (mmHg): 134/74 Accompanied By: SELF Schedule Follow-up Appointment: Yes Clinical Summary of Care: Patient Declined Electronic Signature(s) Signed: 05/27/2021 5:40:17 PM By: Sarah Hammock RN Entered By: Sarah Long on 05/27/2021 12:07:29 -------------------------------------------------------------------------------- Lower Extremity Assessment Details Patient Name: Date of Service: Sarah Haring R. 05/27/2021 7:30 A M Medical Record Number: [id number] Patient Account Number: [id number] Date of Birth/Sex: Treating RN: 27-Aug-1971 (50 y.o. Female) Lorrin Jackson Primary Care [name]: Asencion Noble Other Clinician: Referring Jennene Downie: Treating Norma Ignasiak/Extender: [name] NICHO LS, TO NYA Weeks in Treatment: 0 Edema Assessment Assessed: [Left: Yes] [Right: Yes] Edema: [Left: No] [Right: No] Calf Left: Right: Point of Measurement: 31 cm From Medial Instep 35 cm 31 cm Ankle Left: Right: Point of Measurement: 8 cm From Medial Instep 22.6 cm 22 cm Vascular Assessment Pulses: Dorsalis Pedis Palpable: [Left:Yes] [Right:Yes] Notes Bilateral ABI's= Non Compressible Electronic Signature(s) Signed: 05/27/2021 5:44:30 PM By: Lorrin Jackson Entered By: Lorrin Jackson on 05/27/2021 08:12:54 -------------------------------------------------------------------------------- Multi-Disciplinary Care Plan Details Patient Name: Date of Service: Sarah Haring R. 05/27/2021 7:30 A M Medical Record Number: [id number] Patient Account Number: [id number] Date of Birth/Sex: Treating  RN: 06-26-1971 (50 y.o. Female) Long Gouty Primary Care Krysia Zahradnik: Asencion Noble Other Clinician: Referring Wai Minotti: Treating Kennis Buell/Extender: Garth Bigness LS, TO NYA Weeks in Treatment: 0 Multidisciplinary Care Plan reviewed with physician Active Inactive Nutrition Nursing Diagnoses: Impaired glucose control: actual or potential Potential for alteratiion in Nutrition/Potential for imbalanced nutrition Goals: Patient/caregiver verbalizes understanding of need to maintain therapeutic glucose control per primary care physician Date Initiated: 05/27/2021 Target Resolution Date: 06/24/2021 Goal Status: Active Patient/caregiver will maintain therapeutic glucose control Date Initiated: 05/27/2021 Target Resolution Date: 06/24/2021 Goal Status: Active Interventions: Assess patient nutrition upon admission and as needed per policy Provide education on nutrition Treatment Activities: Patient referred to Primary Care Physician  for further nutritional evaluation : 05/27/2021 Notes: Wound/Skin Impairment Nursing Diagnoses: Impaired tissue integrity Knowledge deficit related to smoking impact on wound healing Knowledge deficit related to ulceration/compromised skin integrity Goals: Patient will demonstrate a reduced rate of smoking or cessation of smoking Date Initiated: 05/27/2021 Target Resolution Date: 06/24/2021 Goal Status: Active Patient/caregiver will verbalize understanding of skin care regimen Date Initiated: 05/27/2021 Target Resolution Date: 06/24/2021 Goal Status: Active Ulcer/skin breakdown will have a volume reduction of 30% by week 4 Date Initiated: 05/27/2021 Target Resolution Date: 06/24/2021 Goal Status: Active Interventions: Assess patient/caregiver ability to obtain necessary supplies Assess patient/caregiver ability to perform ulcer/skin care regimen upon admission and as needed Assess ulceration(s) every visit Provide education on smoking Provide  education on ulcer and skin care Treatment Activities: Skin care regimen initiated : 05/27/2021 Topical wound management initiated : 05/27/2021 Notes: Electronic Signature(s) Signed: 05/27/2021 5:44:00 PM By: Long Gouty RN, BSN Entered By: Long Gouty on 05/27/2021 09:01:24 -------------------------------------------------------------------------------- Pain Assessment Details Patient Name: Date of Service: Sarah Haring R. 05/27/2021 7:30 A M Medical Record Number: [id number] Patient Account Number: [id number] Date of Birth/Sex: Treating RN: 10-07-1971 (50 y.o. Female) Lorrin Jackson Primary Care Salim Forero: Asencion Noble Other Clinician: Referring Jalisa Sacco: Treating Yaqueline Gutter/Extender: [name] NICHO LS, TO NYA Weeks in Treatment: 0 Active Problems Location of Pain Severity and Description of Pain Patient Has Paino No Site Locations Pain Management and Medication Current Pain Management: Notes Denies pain, neuropathy Electronic Signature(s) Signed: 05/27/2021 5:44:30 PM By: Lorrin Jackson Entered By: Lorrin Jackson on 05/27/2021 08:27:57 -------------------------------------------------------------------------------- Patient/Caregiver Education Details Patient Name: Date of Service: Orland Mustard 6/22/2022andnbsp7:30 [place] Record Number: [id number] Patient Account Number: [id number] Date of Birth/Gender: Treating RN: 1971/03/16 (50 y.o. Female) Long Gouty Primary Care Physician: Asencion Noble Other Clinician: Referring Physician: Treating Physician/Extender: Garth Bigness LS, TO NYA Weeks in Treatment: 0 Education Assessment Education Provided To: Patient Education Topics Provided Elevated Blood Sugar/ Impact on Healing: Handouts: Elevated Blood Sugars: How Do They Affect Wound Healing Methods: Explain/Verbal, Printed Responses: Reinforcements needed, State content correctly Smoking and Wound Healing: Methods:  Explain/Verbal Responses: Reinforcements needed, State content correctly Welcome T [place]: o Handouts: Welcome T [place] o Methods: Explain/Verbal, Printed Responses: Reinforcements needed, State content correctly Wound/Skin Impairment: Handouts: Caring for Your Ulcer, Skin Care Do's and Dont's, Smoking and Wound Healing Methods: Explain/Verbal, Printed Responses: Reinforcements needed, State content correctly Electronic Signature(s) Signed: 05/27/2021 5:44:00 PM By: Long Gouty RN, BSN Entered By: Long Gouty on 05/27/2021 09:03:32 -------------------------------------------------------------------------------- Wound Assessment Details Patient Name: Date of Service: Sarah Haring R. 05/27/2021 7:30 A M Medical Record Number: [id number] Patient Account Number: [id number] Date of Birth/Sex: Treating RN: 08/20/1971 (50 y.o. Female) Lorrin Jackson Primary Care Geovannie Vilar: Asencion Noble Other Clinician: Referring Shyne Lehrke: Treating Handsome Anglin/Extender: [name] NICHO LS, TO NYA Weeks in Treatment: 0 Wound Status Wound Number: 1 Primary Etiology: Diabetic Wound/Ulcer of the Lower Extremity Wound Location: Left T Great oe Wound Status: Open Wounding Event: Other Lesion Comorbid History: Glaucoma, Hypertension, Type II Diabetes, Neuropathy Date Acquired: 05/18/2021 Weeks Of Treatment: 0 Clustered Wound: No Photos Wound Measurements Length: (cm) 4.5 Width: (cm) 5 Depth: (cm) 0.2 Area: (cm) 17.671 Volume: (cm) 3.534 Wound Description Classification: Grade 1 Wound Margin: Distinct, outline attached Exudate Amount: Medium Exudate Type: Serosanguineous Exudate Color: red, brown Foul Odor After Cleansing: Slough/Fibrino % Reduction in Area: 0% % Reduction in Volume: 0% Epithelialization: None Tunneling: No Undermining: No No Yes Wound Bed  Granulation Amount: Medium (34-66%) Exposed Structure Granulation Quality: Red Fascia  Exposed: No Necrotic Amount: Medium (34-66%) Fat Layer (Subcutaneous Tissue) Exposed: Yes Necrotic Quality: Adherent Slough Tendon Exposed: No Muscle Exposed: No Joint Exposed: No Bone Exposed: No Treatment Notes Wound #1 (Toe Great) Wound Laterality: Left Cleanser Peri-Wound Care Topical Mupirocin Ointment Discharge Instruction: Apply Mupirocin (Bactroban) thin film to open areas Primary Dressing KerraCel Ag Gelling Fiber Dressing, 4x5 in (silver alginate) Discharge Instruction: Apply silver alginate to wound bed weave in between toes Secondary Dressing Woven Gauze Sponges 2x2 in Discharge Instruction: Apply over primary dressing as directed. Secured With Conforming Stretch Gauze Bandage, Sterile 2x75 (in/in) Discharge Instruction: Secure with stretch gauze as directed. Compression Wrap Compression Stockings Add-Ons Electronic Signature(s) Signed: 05/27/2021 4:31:55 PM By: Sandre Kitty Signed: 05/27/2021 5:44:30 PM By: Lorrin Jackson Entered By: Sandre Kitty on 05/27/2021 15:51:53 -------------------------------------------------------------------------------- Wound Assessment Details Patient Name: Date of Service: Sarah Haring R. 05/27/2021 7:30 A M Medical Record Number: [id number] Patient Account Number: [id number] Date of Birth/Sex: Treating RN: June 06, 1971 (50 y.o. Female) Lorrin Jackson Primary Care Emilina Smarr: Asencion Noble Other Clinician: Referring Puneet Selden: Treating Guiliana Shor/Extender: [name] NICHO LS, TO NYA Weeks in Treatment: 0 Wound Status Wound Number: 2 Primary Etiology: Diabetic Wound/Ulcer of the Lower Extremity Wound Location: Left T Second oe Wound Status: Open Wounding Event: Other Lesion Comorbid History: Glaucoma, Hypertension, Type II Diabetes, Neuropathy Date Acquired: 05/18/2021 Weeks Of Treatment: 0 Clustered Wound: No Photos Wound Measurements Length: (cm) 1.7 Width: (cm) 1.8 Depth: (cm) 0.2 Area: (cm) 2.403 Volume:  (cm) 0.481 % Reduction in Area: 0% % Reduction in Volume: 0% Epithelialization: None Tunneling: No Undermining: No Wound Description Classification: Grade 1 Wound Margin: Distinct, outline attached Exudate Amount: Medium Exudate Type: Serosanguineous Exudate Color: red, brown Foul Odor After Cleansing: No Slough/Fibrino Yes Wound Bed Granulation Amount: Large (67-100%) Exposed Structure Granulation Quality: Red Fascia Exposed: No Necrotic Amount: Small (1-33%) Fat Layer (Subcutaneous Tissue) Exposed: Yes Necrotic Quality: Adherent Slough Tendon Exposed: No Muscle Exposed: No Joint Exposed: No Bone Exposed: No Treatment Notes Wound #2 (Toe Second) Wound Laterality: Left Cleanser Peri-Wound Care Topical Mupirocin Ointment Discharge Instruction: Apply Mupirocin (Bactroban) thin film to open areas Primary Dressing KerraCel Ag Gelling Fiber Dressing, 4x5 in (silver alginate) Discharge Instruction: Apply silver alginate to wound bed weave in between toes Secondary Dressing Woven Gauze Sponges 2x2 in Discharge Instruction: Apply over primary dressing as directed. Secured With Conforming Stretch Gauze Bandage, Sterile 2x75 (in/in) Discharge Instruction: Secure with stretch gauze as directed. Compression Wrap Compression Stockings Add-Ons Electronic Signature(s) Signed: 05/27/2021 4:31:55 PM By: Sandre Kitty Signed: 05/27/2021 5:44:30 PM By: Lorrin Jackson Entered By: Sandre Kitty on 05/27/2021 15:52:13 -------------------------------------------------------------------------------- Wound Assessment Details Patient Name: Date of Service: Sarah Haring R. 05/27/2021 7:30 A M Medical Record Number: [id number] Patient Account Number: [id number] Date of Birth/Sex: Treating RN: Jan 30, 1971 (50 y.o. Female) Lorrin Jackson Primary Care Latayna Ritchie: Asencion Noble Other Clinician: Referring Jina Olenick: Treating Evelio Rueda/Extender: [name] NICHO LS, TO NYA Weeks in  Treatment: 0 Wound Status Wound Number: 3 Primary Etiology: Diabetic Wound/Ulcer of the Lower Extremity Wound Location: Right T Great oe Wound Status: Open Wounding Event: Other Lesion Comorbid History: Glaucoma, Hypertension, Type II Diabetes, Neuropathy Date Acquired: 05/18/2021 Weeks Of Treatment: 0 Clustered Wound: No Photos Wound Measurements Length: (cm) 4 Width: (cm) 5.5 Depth: (cm) 0.2 Area: (cm) 17.279 Volume: (cm) 3.456 % Reduction in Area: 0% % Reduction in Volume: 0% Epithelialization: None Tunneling: No Undermining: No Wound Description Classification: Grade  1 Wound Margin: Distinct, outline attached Exudate Amount: Medium Exudate Type: Serosanguineous Exudate Color: red, brown Foul Odor After Cleansing: No Slough/Fibrino Yes Wound Bed Granulation Amount: Large (67-100%) Exposed Structure Granulation Quality: Red Fascia Exposed: No Necrotic Amount: Small (1-33%) Fat Layer (Subcutaneous Tissue) Exposed: Yes Necrotic Quality: Adherent Slough Tendon Exposed: No Muscle Exposed: No Joint Exposed: No Bone Exposed: No Treatment Notes Wound #3 (Toe Great) Wound Laterality: Right Cleanser Peri-Wound Care Topical Mupirocin Ointment Discharge Instruction: Apply Mupirocin (Bactroban) thin film to open areas Primary Dressing KerraCel Ag Gelling Fiber Dressing, 4x5 in (silver alginate) Discharge Instruction: Apply silver alginate to wound bed weave in between toes Secondary Dressing Woven Gauze Sponges 2x2 in Discharge Instruction: Apply over primary dressing as directed. Secured With Conforming Stretch Gauze Bandage, Sterile 2x75 (in/in) Discharge Instruction: Secure with stretch gauze as directed. Compression Wrap Compression Stockings Add-Ons Electronic Signature(s) Signed: 05/27/2021 4:31:55 PM By: Sandre Kitty Signed: 05/27/2021 5:44:30 PM By: Lorrin Jackson Entered By: Sandre Kitty on 05/27/2021  15:52:42 -------------------------------------------------------------------------------- Wound Assessment Details Patient Name: Date of Service: Sarah Haring R. 05/27/2021 7:30 A M Medical Record Number: [id number] Patient Account Number: [id number] Date of Birth/Sex: Treating RN: 1971/04/26 (50 y.o. Female) Lorrin Jackson Primary Care Izaiah Tabb: Asencion Noble Other Clinician: Referring Renaye Janicki: Treating Ashliegh Parekh/Extender: [name] NICHO LS, TO NYA Weeks in Treatment: 0 Wound Status Wound Number: 4 Primary Etiology: Diabetic Wound/Ulcer of the Lower Extremity Wound Location: Right T Second oe Wound Status: Open Wounding Event: Other Lesion Comorbid History: Glaucoma, Hypertension, Type II Diabetes, Neuropathy Date Acquired: 05/18/2021 Weeks Of Treatment: 0 Clustered Wound: No Photos Wound Measurements Length: (cm) 1.5 Width: (cm) 1.5 Depth: (cm) 0.2 Area: (cm) 1.767 Volume: (cm) 0.353 % Reduction in Area: 0% % Reduction in Volume: 0% Epithelialization: None Tunneling: No Undermining: No Wound Description Classification: Grade 1 Wound Margin: Distinct, outline attached Exudate Amount: Medium Exudate Type: Serosanguineous Exudate Color: red, brown Foul Odor After Cleansing: No Slough/Fibrino Yes Wound Bed Granulation Amount: Large (67-100%) Exposed Structure Granulation Quality: Red Fascia Exposed: No Necrotic Amount: Small (1-33%) Fat Layer (Subcutaneous Tissue) Exposed: Yes Necrotic Quality: Adherent Slough Tendon Exposed: No Muscle Exposed: No Joint Exposed: No Bone Exposed: No Treatment Notes Wound #4 (Toe Second) Wound Laterality: Right Cleanser Peri-Wound Care Topical Mupirocin Ointment Discharge Instruction: Apply Mupirocin (Bactroban) thin film to open areas Primary Dressing KerraCel Ag Gelling Fiber Dressing, 4x5 in (silver alginate) Discharge Instruction: Apply silver alginate to wound bed weave in between toes Secondary  Dressing Woven Gauze Sponges 2x2 in Discharge Instruction: Apply over primary dressing as directed. Secured With Conforming Stretch Gauze Bandage, Sterile 2x75 (in/in) Discharge Instruction: Secure with stretch gauze as directed. Compression Wrap Compression Stockings Add-Ons Electronic Signature(s) Signed: 05/27/2021 4:31:55 PM By: Sandre Kitty Signed: 05/27/2021 5:44:30 PM By: Lorrin Jackson Entered By: Sandre Kitty on 05/27/2021 15:53:01 -------------------------------------------------------------------------------- Wound Assessment Details Patient Name: Date of Service: Sarah Haring R. 05/27/2021 7:30 A M Medical Record Number: [id number] Patient Account Number: [id number] Date of Birth/Sex: Treating RN: [birth year] (50 y.o. Female) Long Gouty Primary Care Lovelle Deitrick: Asencion Noble Other Clinician: Referring Darlen Gledhill: Treating Teal Raben/Extender: [name] NICHO LS, TO NYA Weeks in Treatment: 0 Wound Status Wound Number: 5 Primary Etiology: Diabetic Wound/Ulcer of the Lower Extremity Wound Location: Left T Fifth oe Wound Status: Open Wounding Event: Blister Comorbid History: Glaucoma, Hypertension, Type II Diabetes, Neuropathy Date Acquired: 05/18/2021 Weeks Of Treatment: 0 Clustered Wound: No Wound Measurements Length: (cm) 1.8 Width: (cm) 1.1 Depth: (cm) 0.1 Area: (cm) 1.555 Volume: (  cm) 0.156 Wound Description Classification: Grade 1 Wound Margin: Flat and Intact Exudate Amount: Medium Exudate Type: Serous Exudate Color: amber Foul Odor After Cleansing: Slough/Fibrino % Reduction in Area: % Reduction in Volume: Epithelialization: Medium (34-66%) Tunneling: No Undermining: No No No Wound Bed Granulation Amount: Large (67-100%) Exposed Structure Granulation Quality: Pink Fascia Exposed: No Necrotic Amount: None Present (0%) Fat Layer (Subcutaneous Tissue) Exposed: Yes Tendon Exposed: No Muscle Exposed: No Joint Exposed: No Bone  Exposed: No Treatment Notes Wound #5 (Toe Fifth) Wound Laterality: Left Cleanser Peri-Wound Care Topical Mupirocin Ointment Discharge Instruction: Apply Mupirocin (Bactroban) thin film to open areas Primary Dressing KerraCel Ag Gelling Fiber Dressing, 4x5 in (silver alginate) Discharge Instruction: Apply silver alginate to wound bed weave in between toes Secondary Dressing Woven Gauze Sponges 2x2 in Discharge Instruction: Apply over primary dressing as directed. Secured With Conforming Stretch Gauze Bandage, Sterile 2x75 (in/in) Discharge Instruction: Secure with stretch gauze as directed. Compression Wrap Compression Stockings Add-Ons Electronic Signature(s) Signed: 05/27/2021 5:44:00 PM By: Long Gouty RN, BSN Entered By: Long Gouty on 05/27/2021 09:06:44 -------------------------------------------------------------------------------- Vitals Details Patient Name: Date of Service: [name] 05/27/2021 7:30 A M Medical Record Number: [id number] Patient Account Number: [id number] Date of Birth/Sex: Treating RN: 03-Mar-1971 (50 y.o. Female) Lorrin Jackson Primary Care Kaiana Marion: Asencion Noble Other Clinician: Referring Abshir Paolini: Treating Annalisia Ingber/Extender: [name] NICHO LS, TO NYA Weeks in Treatment: 0 Vital Signs Time Taken: 07:51 Temperature (F): 98.0 Height (in): 64 Pulse (bpm): 90 Source: Stated Respiratory Rate (breaths/min): 16 Weight (lbs): 200 Blood Pressure (mmHg): 125/80 Source: Stated Reference Range: 80 - 120 mg / dl Body Mass Index (BMI): 34.3 Electronic Signature(s) Signed: 05/27/2021 5:44:30 PM By: Lorrin Jackson Entered By: Lorrin Jackson on 05/27/2021 07:52:27

## 2021-05-27 NOTE — Progress Notes (Signed)
Sarah Long, Sarah Long ([id number]) Visit Report for 05/27/2021 Chief Complaint Document Details Patient Name: Date of Service: Sarah Long, Sarah Long 05/27/2021 7:30 A M Medical Record Number: [id number] Patient Account Number: [id number] Date of Birth/Sex: Treating RN: 1971/03/12 (50 y.o. Female) Baruch Gouty Primary Care Provider: Asencion Noble Other Clinician: Referring Provider: Treating Provider/Extender: [name] NICHO LS, TO NYA Weeks in Treatment: 0 Information Obtained from: Patient Chief Complaint Bilateral toe ulcers Electronic Signature(s) Signed: 05/27/2021 8:57:25 AM By: [name] PA-C Entered By: [name] on 05/27/2021 08:57:25 -------------------------------------------------------------------------------- Debridement Details Patient Name: Date of Service: Sarah Haring R. 05/27/2021 7:30 A M Medical Record Number: [id number] Patient Account Number: [id number] Date of Birth/Sex: Treating RN: 12-Dec-1970 (50 y.o. Female) Baruch Gouty Primary Care Provider: Asencion Noble Other Clinician: Referring Provider: Treating Provider/Extender: [name] NICHO LS, TO NYA Weeks in Treatment: 0 Debridement Performed for Assessment: Wound #2 Left T Second oe Performed By: Physician [name], PA Debridement Type: Debridement Severity of Tissue Pre Debridement: Fat layer exposed Level of Consciousness (Pre-procedure): Awake and Alert Pre-procedure Verification/Time Out Yes - 09:00 Taken: Start Time: 09:04 T Area Debrided (L x W): otal 1.7 (cm) x 1.8 (cm) = 3.06 (cm) Tissue and other material debrided: Non-Viable, Skin: Epidermis Level: Skin/Epidermis Debridement Description: Selective/Open Wound Instrument: Forceps, Scissors Bleeding: None End Time: 09:10 Procedural Pain: 0 Post Procedural Pain: 0 Response to Treatment: Procedure was tolerated well Level of Consciousness (Post- Awake and Alert procedure): Post Debridement Measurements of Total  Wound Length: (cm) 1.7 Width: (cm) 1.8 Depth: (cm) 0.1 Volume: (cm) 0.24 Character of Wound/Ulcer Post Debridement: Improved Severity of Tissue Post Debridement: Fat layer exposed Post Procedure Diagnosis Same as Pre-procedure Electronic Signature(s) Signed: 05/27/2021 4:01:47 PM By: [name] PA-C Signed: 05/27/2021 5:44:00 PM By: Baruch Gouty RN, BSN Entered By: Baruch Gouty on 05/27/2021 09:09:45 -------------------------------------------------------------------------------- HPI Details Patient Name: Date of Service: Sarah Ferrari EL R. 05/27/2021 7:30 A M Medical Record Number: [id number] Patient Account Number: [id number] Date of Birth/Sex: Treating RN: 02/13/1971 (50 y.o. Female) Baruch Gouty Primary Care Provider: Asencion Noble Other Clinician: Referring Provider: Treating Provider/Extender: [name] NICHO LS, TO NYA Weeks in Treatment: 0 History of Present Illness HPI Description: 05/27/2021 upon evaluation today patient appears to be doing poorly in regard to her toes on the left and right foot. She has the first and second toe on the left foot as well as the fifth toe. On the right foot is just the first and second toe. With that being said she does not know any injury or otherwise that occurred. She states she just looked down and noticed that it was there and again that was just within the last week or so around 13 June. She does not know of anything that happened she has not gone barefoot she tells me that she wear shoes and again has not been outside barefoot at any point in time. Fortunately there does not appear to be any signs of active infection at this time. No fevers, chills, nausea, vomiting, or diarrhea. With that being said she is on Keflex she was also on doxycycline previous. She does have a strong history of diabetes mellitus type 2 which is fairly uncontrolled she has not had her insulin for quite a while here it seems. I do not have the  final A1c yet although her glucose in the hospital last week was 333. Nonetheless I do believe that this coupled with her smoking is going  to limit her healing to some degree nonetheless we need to get into trying to get her into a better place with regard to this. Her diabetes again is a big deal as far as healing is concerned right now in my opinion being that it still out of control. She also has a cardiac pacemaker she does not really know exactly what happened she tells me she went into DKA was in the hospital and then subsequently had a pacemaker following. Electronic Signature(s) Signed: 05/27/2021 9:17:22 AM By: [name] PA-C Entered By: [name] on 05/27/2021 09:17:22 -------------------------------------------------------------------------------- Physical Exam Details Patient Name: Date of Service: Sarah Long, Sarah EL R. 05/27/2021 7:30 A M Medical Record Number: [id number] Patient Account Number: [id number] Date of Birth/Sex: Treating RN: 12-06-1971 (50 y.o. Female) Baruch Gouty Primary Care Provider: Asencion Noble Other Clinician: Referring Provider: Treating Provider/Extender: [name] NICHO LS, TO NYA Weeks in Treatment: 0 Constitutional sitting or standing blood pressure is within target range for patient.. pulse regular and within target range for patient.[name] respirations regular, non-labored and within target range for patient.[name] temperature within target range for patient.. Well-nourished and well-hydrated in no acute distress. Eyes conjunctiva clear no eyelid edema noted. pupils equal round and reactive to light and accommodation. Ears, Nose, Mouth, and Throat no gross abnormality of ear auricles or external auditory canals. normal hearing noted during conversation. mucus membranes moist. Respiratory normal breathing without difficulty. Cardiovascular 2+ dorsalis pedis/posterior tibialis pulses. no clubbing, cyanosis, significant edema, <3 sec cap  refill. Musculoskeletal normal gait and posture. no significant deformity or arthritic changes, no loss or range of motion, no clubbing. Psychiatric this patient is able to make decisions and demonstrates good insight into disease process. Alert and Oriented x 3. pleasant and cooperative. Notes Upon inspection patient's wound bed actually showed signs of blistering and to be honest this really looks like a burn. I questioned her about any possibilities of chemicals that she could have come in contact with that would lead to the burning. She does not know of anything. However she has neuropathy so she does not necessarily feel everything. With that being said she did not cannot recall anything that got into her shoes that would have caused this. Electronic Signature(s) Signed: 05/27/2021 9:18:27 AM By: [name] PA-C Entered By: [name] on 05/27/2021 09:18:26 -------------------------------------------------------------------------------- Physician Orders Details Patient Name: Date of Service: Sarah Ferrari EL R. 05/27/2021 7:30 A M Medical Record Number: [id number] Patient Account Number: [id number] Date of Birth/Sex: Treating RN: 10/29/1971 (50 y.o. Female) Baruch Gouty Primary Care Provider: Asencion Noble Other Clinician: Referring Provider: Treating Provider/Extender: [name] NICHO LS, TO NYA Weeks in Treatment: 0 Verbal / Phone Orders: No Diagnosis Coding ICD-10 Coding Code Description E11.621 Type 2 diabetes mellitus with foot ulcer L97.522 Non-pressure chronic ulcer of other part of left foot with fat layer exposed L97.512 Non-pressure chronic ulcer of other part of right foot with fat layer exposed E11.43 Type 2 diabetes mellitus with diabetic autonomic (poly)neuropathy I10 Essential (primary) hypertension Z95.0 Presence of cardiac pacemaker Follow-up Appointments ppointment in 1 week. - with Margarita Grizzle Return A Bathing/ Shower/ Hygiene May shower and wash  wound with soap and water. Wound Treatment Wound #1 - T Great oe Wound Laterality: Left Topical: Mupirocin Ointment Every Other Day/15 Days Discharge Instructions: Apply Mupirocin (Bactroban) thin film to open areas Prim Dressing: KerraCel Ag Gelling Fiber Dressing, 4x5 in (silver alginate) Every Other Day/15 Days ary Discharge Instructions: Apply silver alginate to wound  bed weave in between toes Secondary Dressing: Woven Gauze Sponges 2x2 in Every Other Day/15 Days Discharge Instructions: Apply over primary dressing as directed. Secured With: Child psychotherapist, Sterile 2x75 (in/in) Every Other Day/15 Days Discharge Instructions: Secure with stretch gauze as directed. Wound #2 - T Second oe Wound Laterality: Left Topical: Mupirocin Ointment Every Other Day/15 Days Discharge Instructions: Apply Mupirocin (Bactroban) thin film to open areas Prim Dressing: KerraCel Ag Gelling Fiber Dressing, 4x5 in (silver alginate) Every Other Day/15 Days ary Discharge Instructions: Apply silver alginate to wound bed weave in between toes Secondary Dressing: Woven Gauze Sponges 2x2 in Every Other Day/15 Days Discharge Instructions: Apply over primary dressing as directed. Secured With: Child psychotherapist, Sterile 2x75 (in/in) Every Other Day/15 Days Discharge Instructions: Secure with stretch gauze as directed. Wound #3 - T Great oe Wound Laterality: Right Topical: Mupirocin Ointment Every Other Day/15 Days Discharge Instructions: Apply Mupirocin (Bactroban) thin film to open areas Prim Dressing: KerraCel Ag Gelling Fiber Dressing, 4x5 in (silver alginate) Every Other Day/15 Days ary Discharge Instructions: Apply silver alginate to wound bed weave in between toes Secondary Dressing: Woven Gauze Sponges 2x2 in Every Other Day/15 Days Discharge Instructions: Apply over primary dressing as directed. Secured With: Child psychotherapist, Sterile 2x75 (in/in) Every  Other Day/15 Days Discharge Instructions: Secure with stretch gauze as directed. Wound #4 - T Second oe Wound Laterality: Right Topical: Mupirocin Ointment Every Other Day/15 Days Discharge Instructions: Apply Mupirocin (Bactroban) thin film to open areas Prim Dressing: KerraCel Ag Gelling Fiber Dressing, 4x5 in (silver alginate) Every Other Day/15 Days ary Discharge Instructions: Apply silver alginate to wound bed weave in between toes Secondary Dressing: Woven Gauze Sponges 2x2 in Every Other Day/15 Days Discharge Instructions: Apply over primary dressing as directed. Secured With: Child psychotherapist, Sterile 2x75 (in/in) Every Other Day/15 Days Discharge Instructions: Secure with stretch gauze as directed. Wound #5 - T Fifth oe Wound Laterality: Left Topical: Mupirocin Ointment Every Other Day/15 Days Discharge Instructions: Apply Mupirocin (Bactroban) thin film to open areas Prim Dressing: KerraCel Ag Gelling Fiber Dressing, 4x5 in (silver alginate) Every Other Day/15 Days ary Discharge Instructions: Apply silver alginate to wound bed weave in between toes Secondary Dressing: Woven Gauze Sponges 2x2 in Every Other Day/15 Days Discharge Instructions: Apply over primary dressing as directed. Secured With: Child psychotherapist, Sterile 2x75 (in/in) Every Other Day/15 Days Discharge Instructions: Secure with stretch gauze as directed. Patient Medications llergies: strawberry, Sulfa (Sulfonamide Antibiotics) A Notifications Medication Indication Start End 05/27/2021 mupirocin DOSE topical 2 % ointment - ointment topical applied in a thin film to the open wounds on your toes every other day x 30 days or until healed Electronic Signature(s) Signed: 05/27/2021 9:19:45 AM By: [name] PA-C Entered By: [name] on 05/27/2021 09:19:44 -------------------------------------------------------------------------------- Problem List Details Patient  Name: Date of Service: Sarah Haring R. 05/27/2021 7:30 A M Medical Record Number: [id number] Patient Account Number: [id number] Date of Birth/Sex: Treating RN: 05/28/1971 (50 y.o. Female) Baruch Gouty Primary Care Provider: Asencion Noble Other Clinician: Referring Provider: Treating Provider/Extender: [name] NICHO LS, TO NYA Weeks in Treatment: 0 Active Problems ICD-10 Encounter Code Description Active Date MDM Diagnosis E11.621 Type 2 diabetes mellitus with foot ulcer 05/27/2021 No Yes L97.522 Non-pressure chronic ulcer of other part of left foot with fat layer exposed 05/27/2021 No Yes L97.512 Non-pressure chronic ulcer of other part of right foot with fat layer exposed 05/27/2021 No Yes E11.43  Type 2 diabetes mellitus with diabetic autonomic (poly)neuropathy 05/27/2021 No Yes I10 Essential (primary) hypertension 05/27/2021 No Yes Z95.0 Presence of cardiac pacemaker 05/27/2021 No Yes Inactive Problems Resolved Problems Electronic Signature(s) Signed: 05/27/2021 8:57:14 AM By: [name] PA-C Entered By: [name] on 05/27/2021 08:57:14 -------------------------------------------------------------------------------- Progress Note Details Patient Name: Date of Service: Sarah Haring R. 05/27/2021 7:30 A M Medical Record Number: [id number] Patient Account Number: [id number] Date of Birth/Sex: Treating RN: 1971/01/17 (50 y.o. Female) Baruch Gouty Primary Care Provider: Asencion Noble Other Clinician: Referring Provider: Treating Provider/Extender: [name] NICHO LS, TO NYA Weeks in Treatment: 0 Subjective Chief Complaint Information obtained from Patient Bilateral toe ulcers History of Present Illness (HPI) 05/27/2021 upon evaluation today patient appears to be doing poorly in regard to her toes on the left and right foot. She has the first and second toe on the left foot as well as the fifth toe. On the right foot is just the first and second toe.  With that being said she does not know any injury or otherwise that occurred. She states she just looked down and noticed that it was there and again that was just within the last week or so around 13 June. She does not know of anything that happened she has not gone barefoot she tells me that she wear shoes and again has not been outside barefoot at any point in time. Fortunately there does not appear to be any signs of active infection at this time. No fevers, chills, nausea, vomiting, or diarrhea. With that being said she is on Keflex she was also on doxycycline previous. She does have a strong history of diabetes mellitus type 2 which is fairly uncontrolled she has not had her insulin for quite a while here it seems. I do not have the final A1c yet although her glucose in the hospital last week was 333. Nonetheless I do believe that this coupled with her smoking is going to limit her healing to some degree nonetheless we need to get into trying to get her into a better place with regard to this. Her diabetes again is a big deal as far as healing is concerned right now in my opinion being that it still out of control. She also has a cardiac pacemaker she does not really know exactly what happened she tells me she went into DKA was in the hospital and then subsequently had a pacemaker following. Patient History Information obtained from Patient. Allergies strawberry, Sulfa (Sulfonamide Antibiotics) Family History Diabetes - Mother,Father,Paternal Grandparents,Maternal Grandparents, Heart Disease - Maternal Grandparents, Hypertension - Maternal Grandparents, Kidney Disease - Father, Stroke - Maternal Grandparents, No family history of Cancer, Hereditary Spherocytosis, Lung Disease, Seizures, Thyroid Problems, Tuberculosis. Social History Current some day smoker - 1/2 pack day, Marital Status - Single, Alcohol Use - Moderate, Drug Use - No History, Caffeine Use - Moderate. Medical  History Eyes Patient has history of Glaucoma Cardiovascular Patient has history of Hypertension Endocrine Patient has history of Type II Diabetes Neurologic Patient has history of Neuropathy Patient is treated with Insulin. Blood sugar is not tested. Medical A Surgical History Notes nd Cardiovascular Pacemaker Genitourinary Herpes, Heavy Vaginal Bleeding, Uterine Fibroid Review of Systems (ROS) Eyes Complains or has symptoms of Glasses / Contacts. Ear/Nose/Mouth/Throat Denies complaints or symptoms of Chronic sinus problems or rhinitis. Respiratory Denies complaints or symptoms of Chronic or frequent coughs, Shortness of Breath. Gastrointestinal Denies complaints or symptoms of Frequent diarrhea, Nausea, Vomiting. Genitourinary Current UTI Integumentary (  Skin) Complains or has symptoms of Wounds. Musculoskeletal Denies complaints or symptoms of Muscle Pain, Muscle Weakness. Neurologic Denies complaints or symptoms of Numbness/parasthesias. Psychiatric Denies complaints or symptoms of Claustrophobia, Suicidal. Objective Constitutional sitting or standing blood pressure is within target range for patient.. pulse regular and within target range for patient.[name] respirations regular, non-labored and within target range for patient.[name] temperature within target range for patient.. Well-nourished and well-hydrated in no acute distress. Vitals Time Taken: 7:51 AM, Height: 64 in, Source: Stated, Weight: 200 lbs, Source: Stated, BMI: 34.3, Temperature: 98.0 F, Pulse: 90 bpm, Respiratory Rate: 16 breaths/min, Blood Pressure: 125/80 mmHg. Eyes conjunctiva clear no eyelid edema noted. pupils equal round and reactive to light and accommodation. Ears, Nose, Mouth, and Throat no gross abnormality of ear auricles or external auditory canals. normal hearing noted during conversation. mucus membranes moist. Respiratory normal breathing without difficulty. Cardiovascular 2+ dorsalis  pedis/posterior tibialis pulses. no clubbing, cyanosis, significant edema, Musculoskeletal normal gait and posture. no significant deformity or arthritic changes, no loss or range of motion, no clubbing. Psychiatric this patient is able to make decisions and demonstrates good insight into disease process. Alert and Oriented x 3. pleasant and cooperative. General Notes: Upon inspection patient's wound bed actually showed signs of blistering and to be honest this really looks like a burn. I questioned her about any possibilities of chemicals that she could have come in contact with that would lead to the burning. She does not know of anything. However she has neuropathy so she does not necessarily feel everything. With that being said she did not cannot recall anything that got into her shoes that would have caused this. Integumentary (Hair, Skin) Wound #1 status is Open. Original cause of wound was Other Lesion. The date acquired was: 05/18/2021. The wound is located on the Left T Great. The wound oe measures 4.5cm length x 5cm width x 0.2cm depth; 17.671cm^2 area and 3.534cm^3 volume. There is Fat Layer (Subcutaneous Tissue) exposed. There is no tunneling or undermining noted. There is a medium amount of serosanguineous drainage noted. The wound margin is distinct with the outline attached to the wound base. There is medium (34-66%) red granulation within the wound bed. There is a medium (34-66%) amount of necrotic tissue within the wound bed including Adherent Slough. Wound #2 status is Open. Original cause of wound was Other Lesion. The date acquired was: 05/18/2021. The wound is located on the Left T Second. The oe wound measures 1.7cm length x 1.8cm width x 0.2cm depth; 2.403cm^2 area and 0.481cm^3 volume. There is Fat Layer (Subcutaneous Tissue) exposed. There is no tunneling or undermining noted. There is a medium amount of serosanguineous drainage noted. The wound margin is distinct with the  outline attached to the wound base. There is large (67-100%) red granulation within the wound bed. There is a small (1-33%) amount of necrotic tissue within the wound bed including Adherent Slough. Wound #3 status is Open. Original cause of wound was Other Lesion. The date acquired was: 05/18/2021. The wound is located on the Right T Great. The oe wound measures 4cm length x 5.5cm width x 0.2cm depth; 17.279cm^2 area and 3.456cm^3 volume. There is Fat Layer (Subcutaneous Tissue) exposed. There is no tunneling or undermining noted. There is a medium amount of serosanguineous drainage noted. The wound margin is distinct with the outline attached to the wound base. There is large (67-100%) red granulation within the wound bed. There is a small (1-33%) amount of necrotic tissue within the wound  bed including Adherent Slough. Wound #4 status is Open. Original cause of wound was Other Lesion. The date acquired was: 05/18/2021. The wound is located on the Right T Second. The oe wound measures 1.5cm length x 1.5cm width x 0.2cm depth; 1.767cm^2 area and 0.353cm^3 volume. There is Fat Layer (Subcutaneous Tissue) exposed. There is no tunneling or undermining noted. There is a medium amount of serosanguineous drainage noted. The wound margin is distinct with the outline attached to the wound base. There is large (67-100%) red granulation within the wound bed. There is a small (1-33%) amount of necrotic tissue within the wound bed including Adherent Slough. Wound #5 status is Open. Original cause of wound was Blister. The date acquired was: 05/18/2021. The wound is located on the Left T Fifth. The wound oe measures 1.8cm length x 1.1cm width x 0.1cm depth; 1.555cm^2 area and 0.156cm^3 volume. There is Fat Layer (Subcutaneous Tissue) exposed. There is no tunneling or undermining noted. There is a medium amount of serous drainage noted. The wound margin is flat and intact. There is large (67-100%) pink granulation  within the wound bed. There is no necrotic tissue within the wound bed. Assessment Active Problems ICD-10 Type 2 diabetes mellitus with foot ulcer Non-pressure chronic ulcer of other part of left foot with fat layer exposed Non-pressure chronic ulcer of other part of right foot with fat layer exposed Type 2 diabetes mellitus with diabetic autonomic (poly)neuropathy Essential (primary) hypertension Presence of cardiac pacemaker Procedures Wound #2 Pre-procedure diagnosis of Wound #2 is a Diabetic Wound/Ulcer of the Lower Extremity located on the Left T Second .Severity of Tissue Pre Debridement oe is: Fat layer exposed. There was a Selective/Open Wound Skin/Epidermis Debridement with a total area of 3.06 sq cm performed by [name], PA. With the following instrument(s): Forceps, and Scissors to remove Non-Viable tissue/material. Material removed includes Skin: Epidermis. No specimens were taken. A time out was conducted at 09:00, prior to the start of the procedure. There was no bleeding. The procedure was tolerated well with a pain level of 0 throughout and a pain level of 0 following the procedure. Post Debridement Measurements: 1.7cm length x 1.8cm width x 0.1cm depth; 0.24cm^3 volume. Character of Wound/Ulcer Post Debridement is improved. Severity of Tissue Post Debridement is: Fat layer exposed. Post procedure Diagnosis Wound #2: Same as Pre-Procedure Plan Follow-up Appointments: Return Appointment in 1 week. - with Glynn Octave Shower/ Hygiene: May shower and wash wound with soap and water. The following medication(s) was prescribed: mupirocin topical 2 % ointment ointment topical applied in a thin film to the open wounds on your toes every other day x 30 days or until healed starting 05/27/2021 WOUND #1: - T Great Wound Laterality: Left oe Topical: Mupirocin Ointment Every Other Day/15 Days Discharge Instructions: Apply Mupirocin (Bactroban) thin film to open areas Prim  Dressing: KerraCel Ag Gelling Fiber Dressing, 4x5 in (silver alginate) Every Other Day/15 Days ary Discharge Instructions: Apply silver alginate to wound bed weave in between toes Secondary Dressing: Woven Gauze Sponges 2x2 in Every Other Day/15 Days Discharge Instructions: Apply over primary dressing as directed. Secured With: Child psychotherapist, Sterile 2x75 (in/in) Every Other Day/15 Days Discharge Instructions: Secure with stretch gauze as directed. WOUND #2: - T Second oe Wound Laterality: Left Topical: Mupirocin Ointment Every Other Day/15 Days Discharge Instructions: Apply Mupirocin (Bactroban) thin film to open areas Prim Dressing: KerraCel Ag Gelling Fiber Dressing, 4x5 in (silver alginate) Every Other Day/15 Days ary Discharge Instructions: Apply  silver alginate to wound bed weave in between toes Secondary Dressing: Woven Gauze Sponges 2x2 in Every Other Day/15 Days Discharge Instructions: Apply over primary dressing as directed. Secured With: Child psychotherapist, Sterile 2x75 (in/in) Every Other Day/15 Days Discharge Instructions: Secure with stretch gauze as directed. WOUND #3: - T Great Wound Laterality: Right oe Topical: Mupirocin Ointment Every Other Day/15 Days Discharge Instructions: Apply Mupirocin (Bactroban) thin film to open areas Prim Dressing: KerraCel Ag Gelling Fiber Dressing, 4x5 in (silver alginate) Every Other Day/15 Days ary Discharge Instructions: Apply silver alginate to wound bed weave in between toes Secondary Dressing: Woven Gauze Sponges 2x2 in Every Other Day/15 Days Discharge Instructions: Apply over primary dressing as directed. Secured With: Child psychotherapist, Sterile 2x75 (in/in) Every Other Day/15 Days Discharge Instructions: Secure with stretch gauze as directed. WOUND #4: - T Second Wound Laterality: Right oe Topical: Mupirocin Ointment Every Other Day/15 Days Discharge Instructions: Apply Mupirocin  (Bactroban) thin film to open areas Prim Dressing: KerraCel Ag Gelling Fiber Dressing, 4x5 in (silver alginate) Every Other Day/15 Days ary Discharge Instructions: Apply silver alginate to wound bed weave in between toes Secondary Dressing: Woven Gauze Sponges 2x2 in Every Other Day/15 Days Discharge Instructions: Apply over primary dressing as directed. Secured With: Child psychotherapist, Sterile 2x75 (in/in) Every Other Day/15 Days Discharge Instructions: Secure with stretch gauze as directed. WOUND #5: - T Fifth Wound Laterality: Left oe Topical: Mupirocin Ointment Every Other Day/15 Days Discharge Instructions: Apply Mupirocin (Bactroban) thin film to open areas Prim Dressing: KerraCel Ag Gelling Fiber Dressing, 4x5 in (silver alginate) Every Other Day/15 Days ary Discharge Instructions: Apply silver alginate to wound bed weave in between toes Secondary Dressing: Woven Gauze Sponges 2x2 in Every Other Day/15 Days Discharge Instructions: Apply over primary dressing as directed. Secured With: Child psychotherapist, Sterile 2x75 (in/in) Every Other Day/15 Days Discharge Instructions: Secure with stretch gauze as directed. 1. Would recommend currently that we go ahead and continue with the wound care measures as before and the patient is in agreement with the plan. Specifically this includes the use of an ointment although I would recommend mupirocin for her I think this is probably can be the best option and the patient is in agreement with that plan. 2. I would recommend as well that we use silver alginate on top of the mupirocin ointment after applied to the toes and the patient is in agreement with that plan. 3. I am good recommend as well the patient keep these covered at all times I am fine with her washing her toes but I do not think she needs to submerge them in anything. She voiced understanding. We will see patient back for reevaluation in 1 week here in the  clinic. If anything worsens or changes patient will contact our office for additional recommendations. Electronic Signature(s) Signed: 05/27/2021 9:20:01 AM By: [name] PA-C Entered By: [name] on 05/27/2021 09:20:00 -------------------------------------------------------------------------------- HxROS Details Patient Name: Date of Service: Sarah Ferrari EL R. 05/27/2021 7:30 A M Medical Record Number: [id number] Patient Account Number: [id number] Date of Birth/Sex: Treating RN: 1971/06/02 (50 y.o. Female) Lorrin Jackson Primary Care Provider: Asencion Noble Other Clinician: Referring Provider: Treating Provider/Extender: [name] NICHO LS, TO NYA Weeks in Treatment: 0 Information Obtained From Patient Eyes Complaints and Symptoms: Positive for: Glasses / Contacts Medical History: Positive for: Glaucoma Ear/Nose/Mouth/Throat Complaints and Symptoms: Negative for: Chronic sinus problems or rhinitis Respiratory Complaints and Symptoms: Negative for:  Chronic or frequent coughs; Shortness of Breath Gastrointestinal Complaints and Symptoms: Negative for: Frequent diarrhea; Nausea; Vomiting Integumentary (Skin) Complaints and Symptoms: Positive for: Wounds Musculoskeletal Complaints and Symptoms: Negative for: Muscle Pain; Muscle Weakness Neurologic Complaints and Symptoms: Negative for: Numbness/parasthesias Medical History: Positive for: Neuropathy Psychiatric Complaints and Symptoms: Negative for: Claustrophobia; Suicidal Hematologic/Lymphatic Cardiovascular Medical History: Positive for: Hypertension Past Medical History Notes: Pacemaker Endocrine Medical History: Positive for: Type II Diabetes Treated with: Insulin Blood sugar tested every day: No Genitourinary Complaints and Symptoms: Review of System Notes: Current UTI Medical History: Past Medical History Notes: Herpes, Heavy Vaginal Bleeding, Uterine  Fibroid Immunological Oncologic HBO Extended History Items Eyes: Glaucoma Immunizations Pneumococcal Vaccine: Received Pneumococcal Vaccination: Yes Implantable Devices Yes Family and Social History Cancer: No; Diabetes: Yes - Mother,Father,Paternal Grandparents,Maternal Grandparents; Heart Disease: Yes - Maternal Grandparents; Hereditary Spherocytosis: No; Hypertension: Yes - Maternal Grandparents; Kidney Disease: Yes - Father; Lung Disease: No; Seizures: No; Stroke: Yes - Maternal Grandparents; Thyroid Problems: No; Tuberculosis: No; Current some day smoker - 1/2 pack day; Marital Status - Single; Alcohol Use: Moderate; Drug Use: No History; Caffeine Use: Moderate; Financial Concerns: No; Food, Clothing or Shelter Needs: No; Support System Lacking: No; Transportation Concerns: No Electronic Signature(s) Signed: 05/27/2021 4:01:47 PM By: [name] PA-C Signed: 05/27/2021 5:44:30 PM By: Lorrin Jackson Entered By: Lorrin Jackson on 05/27/2021 08:01:17 -------------------------------------------------------------------------------- [place] Details Patient Name: Date of Service: Orland Mustard 05/27/2021 Medical Record Number: [id number] Patient Account Number: [id number] Date of Birth/Sex: Treating RN: 03-08-1971 (50 y.o. Female) Baruch Gouty Primary Care Provider: Asencion Noble Other Clinician: Referring Provider: Treating Provider/Extender: [name] NICHO LS, TO NYA Weeks in Treatment: 0 Diagnosis Coding ICD-10 Codes Code Description E11.621 Type 2 diabetes mellitus with foot ulcer L97.522 Non-pressure chronic ulcer of other part of left foot with fat layer exposed L97.512 Non-pressure chronic ulcer of other part of right foot with fat layer exposed E11.43 Type 2 diabetes mellitus with diabetic autonomic (poly)neuropathy I10 Essential (primary) hypertension Z95.0 Presence of cardiac pacemaker Facility Procedures CPT4 Code: 37342876 Description: [place] VISIT-LEV 3 EST PT Modifier: 25 Quantity: 1 CPT4 Code: [id number] Description: 03559 - DEBRIDE WOUND 1ST 20 SQ CM OR < ICD-10 Diagnosis Description L97.522 Non-pressure chronic ulcer of other part of left foot with fat layer exposed Modifier: Quantity: 1 Physician Procedures : CPT4 Code Description Modifier [id number] - WC PHYS LEVEL 4 - NEW PT 25 ICD-10 Diagnosis Description E11.621 Type 2 diabetes mellitus with foot ulcer L97.522 Non-pressure chronic ulcer of other part of left foot with fat layer exposed L97.512  Non-pressure chronic ulcer of other part of right foot with fat layer exposed E11.43 Type 2 diabetes mellitus with diabetic autonomic (poly)neuropathy Quantity: 1 : [id number] - WC PHYS DEBR WO ANESTH 20 SQ CM ICD-10 Diagnosis Description L97.522 Non-pressure chronic ulcer of other part of left foot with fat layer exposed Quantity: 1 Electronic Signature(s) Signed: 05/27/2021 9:20:17 AM By: [name] PA-C Entered By: [name] on 05/27/2021 09:20:16

## 2021-05-27 NOTE — Progress Notes (Signed)
JAYLISE, PEEK ([id number]) Visit Report for 05/27/2021 Abuse/Suicide Risk Screen Details Patient Name: Date of Service: MELISIA, LEMING 05/27/2021 7:30 A M Medical Record Number: [id number] Patient Account Number: [id number] Date of Birth/Sex: Treating RN: Aug 21, 1971 (50 y.o. Female) Lorrin Jackson Primary Care Yuvraj Pfeifer: Asencion Noble Other Clinician: Referring Lanyla Costello: Treating Kaivon Livesey/Extender: [name] NICHO LS, TO NYA Weeks in Treatment: 0 Abuse/Suicide Risk Screen Items Answer ABUSE RISK SCREEN: Has anyone close to you tried to hurt or harm you recentlyo No Do you feel uncomfortable with anyone in your familyo No Has anyone forced you do things that you didnt want to doo No Electronic Signature(s) Signed: 05/27/2021 5:44:30 PM By: Lorrin Jackson Entered By: Lorrin Jackson on 05/27/2021 08:01:24 -------------------------------------------------------------------------------- Activities of Daily Living Details Patient Name: Date of Service: REDELL, BHANDARI 05/27/2021 7:30 A M Medical Record Number: [id number] Patient Account Number: [id number] Date of Birth/Sex: Treating RN: 06/24/1971 (50 y.o. Female) Lorrin Jackson Primary Care Ramla Hase: Asencion Noble Other Clinician: Referring Galdino Hinchman: Treating Dametri Ozburn/Extender: [name] NICHO LS, TO NYA Weeks in Treatment: 0 Activities of Daily Living Items Answer Activities of Daily Living (Please select one for each item) Drive Automobile Completely Able T Medications ake Completely Able Use T elephone Completely Able Care for Appearance Completely Able Use T oilet Completely Able Bath / Shower Completely Able Dress Self Completely Able Feed Self Completely Able Walk Completely Able Get In / Out Bed Completely Able Housework Completely Able Prepare Meals Completely [place] for Self Completely Able Electronic Signature(s) Signed: 05/27/2021 5:44:30 PM By: Lorrin Jackson Entered By: Lorrin Jackson on 05/27/2021 08:01:48 -------------------------------------------------------------------------------- Education Screening Details Patient Name: Date of Service: [name] 05/27/2021 7:30 A M Medical Record Number: [id number] Patient Account Number: [id number] Date of Birth/Sex: Treating RN: 04-May-1971 (50 y.o. Female) Lorrin Jackson Primary Care Diamone Whistler: Asencion Noble Other Clinician: Referring Dodie Parisi: Treating Voncille Simm/Extender: Garth Bigness LS, TO NYA Weeks in Treatment: 0 Primary Learner Assessed: Patient Learning Preferences/Education Level/Primary Language Learning Preference: Explanation, Demonstration, Printed Material Highest Education Level: College or Above Preferred Language: English Cognitive Barrier Language Barrier: No Translator Needed: No Memory Deficit: No Emotional Barrier: No Cultural/Religious Beliefs Affecting Medical Care: No Physical Barrier Impaired Vision: Yes Glasses Impaired Hearing: No Decreased Hand dexterity: No Knowledge/Comprehension Knowledge Level: High Comprehension Level: High Ability to understand written instructions: High Ability to understand verbal instructions: High Motivation Anxiety Level: Calm Cooperation: Cooperative Education Importance: Acknowledges Need Interest in Health Problems: Asks Questions Perception: Coherent Willingness to Engage in Self-Management Medium Activities: Readiness to Engage in Self-Management Medium Activities: Electronic Signature(s) Signed: 05/27/2021 5:44:30 PM By: Lorrin Jackson Entered By: Lorrin Jackson on 05/27/2021 08:02:44 -------------------------------------------------------------------------------- Fall Risk Assessment Details Patient Name: Date of Service: [name] 05/27/2021 7:30 A M Medical Record Number: [id number] Patient Account Number: [id number] Date of Birth/Sex: Treating RN: 1971-10-26 (50 y.o. Female) Lorrin Jackson Primary Care Clifford Coudriet: Asencion Noble Other Clinician: Referring Bricen Victory: Treating Jasiah Elsen/Extender: [name] NICHO LS, TO NYA Weeks in Treatment: 0 Fall Risk Assessment Items Have you had 2 or more falls in the last 12 monthso 0 No Have you had any fall that resulted in injury in the last 12 monthso 0 No FALLS RISK SCREEN History of falling - immediate or within 3 months 0 No Secondary diagnosis (Do you have 2 or more medical diagnoseso) 0 No Ambulatory aid None/bed rest/wheelchair/nurse 0 Yes Crutches/cane/walker 0 No Furniture 0 No Intravenous therapy Access/Saline/Heparin Lock 0  No Gait/Transferring Normal/ bed rest/ wheelchair 0 Yes Weak (short steps with or without shuffle, stooped but able to lift head while walking, may seek 0 No support from furniture) Impaired (short steps with shuffle, may have difficulty arising from chair, head down, impaired 0 No balance) Mental Status Oriented to own ability 0 Yes Electronic Signature(s) Signed: 05/27/2021 5:44:30 PM By: Lorrin Jackson Entered By: Lorrin Jackson on 05/27/2021 08:03:07 -------------------------------------------------------------------------------- Foot Assessment Details Patient Name: Date of Service: [name] 05/27/2021 7:30 A M Medical Record Number: [id number] Patient Account Number: [id number] Date of Birth/Sex: Treating RN: 1971/04/10 (50 y.o. Female) Lorrin Jackson Primary Care Tyrece Vanterpool: Asencion Noble Other Clinician: Referring Odelia Graciano: Treating Brandilyn Nanninga/Extender: [name] NICHO LS, TO NYA Weeks in Treatment: 0 Foot Assessment Items Site Locations + = Sensation present, - = Sensation absent, C = Callus, U = Ulcer R = Redness, W = Warmth, M = Maceration, PU = Pre-ulcerative lesion F = Fissure, S = Swelling, D = Dryness Assessment Right: Left: Other Deformity: No No Prior Foot Ulcer: No No Prior Amputation: No No Charcot Joint: No No Ambulatory Status: Ambulatory  Without Help Gait: Steady Electronic Signature(s) Signed: 05/27/2021 5:44:30 PM By: Lorrin Jackson Entered By: Lorrin Jackson on 05/27/2021 08:07:00 -------------------------------------------------------------------------------- Nutrition Risk Screening Details Patient Name: Date of Service: ROSAELENA, KEMNITZ 05/27/2021 7:30 A M Medical Record Number: [id number] Patient Account Number: [id number] Date of Birth/Sex: Treating RN: 1971/04/01 (50 y.o. Female) Lorrin Jackson Primary Care Andrez Lieurance: Asencion Noble Other Clinician: Referring Susi Goslin: Treating Lailoni Baquera/Extender: [name] NICHO LS, TO NYA Weeks in Treatment: 0 Height (in): 64 Weight (lbs): 200 Body Mass Index (BMI): 34.3 Nutrition Risk Screening Items Score Screening NUTRITION RISK SCREEN: I have an illness or condition that made me change the kind and/or amount of food I eat 2 Yes I eat fewer than two meals per day 0 No I eat few fruits and vegetables, or milk products 0 No I have three or more drinks of beer, liquor or wine almost every day 0 No I have tooth or mouth problems that make it hard for me to eat 0 No I don't always have enough money to buy the food I need 0 No I eat alone most of the time 0 No I take three or more different prescribed or over-the-counter drugs a day 1 Yes Without wanting to, I have lost or gained 10 pounds in the last six months 0 No I am not always physically able to shop, cook and/or feed myself 0 No Nutrition Protocols Good Risk Protocol Moderate Risk Protocol 0 Provide education on nutrition High Risk Proctocol Risk Level: Moderate Risk Score: 3 Electronic Signature(s) Signed: 05/27/2021 5:44:30 PM By: Lorrin Jackson Entered By: Lorrin Jackson on 05/27/2021 08:03:29

## 2021-05-28 ENCOUNTER — Other Ambulatory Visit: Payer: Self-pay

## 2021-06-02 ENCOUNTER — Telehealth: Payer: Self-pay | Admitting: Critical Care Medicine

## 2021-06-02 NOTE — Telephone Encounter (Signed)
Called patient and LVM to return call and schedule an appt with Lurena Joiner our clinical pharmacist.

## 2021-06-03 ENCOUNTER — Encounter (HOSPITAL_BASED_OUTPATIENT_CLINIC_OR_DEPARTMENT_OTHER): Payer: Medicaid Other | Admitting: Physician Assistant

## 2021-06-03 ENCOUNTER — Other Ambulatory Visit: Payer: Self-pay

## 2021-06-03 DIAGNOSIS — E11621 Type 2 diabetes mellitus with foot ulcer: Secondary | ICD-10-CM | POA: Diagnosis not present

## 2021-06-03 NOTE — Progress Notes (Addendum)
Sarah Long, PRO ([id number]) Visit Report for 06/03/2021 Chief Complaint Document Details Patient Name: Date of Service: Sarah Long, Sarah Long 06/03/2021 8:30 A M Medical Record Number: [id number] Patient Account Number: [id number] Date of Birth/Sex: Treating RN: 1971/05/02 (50 y.o. [name] Primary Care Provider: Asencion Noble Other Clinician: Referring Provider: Treating Provider/Extender: Emelda Brothers in Treatment: 1 Information Obtained from: Patient Chief Complaint Bilateral toe ulcers Electronic Signature(s) Signed: 06/03/2021 8:59:29 AM By: [name] PA-C Entered By: [name] on 06/03/2021 08:59:29 -------------------------------------------------------------------------------- HPI Details Patient Name: Date of Service: Sarah Ferrari EL R. 06/03/2021 8:30 A M Medical Record Number: [id number] Patient Account Number: [id number] Date of Birth/Sex: Treating RN: 04-04-1971 (50 y.o. [name] Primary Care Provider: Asencion Noble Other Clinician: Referring Provider: Treating Provider/Extender: Emelda Brothers in Treatment: 1 History of Present Illness HPI Description: 05/27/2021 upon evaluation today patient appears to be doing poorly in regard to her toes on the left and right foot. She has the first and second toe on the left foot as well as the fifth toe. On the right foot is just the first and second toe. With that being said she does not know any injury or otherwise that occurred. She states she just looked down and noticed that it was there and again that was just within the last week or so around 13 June. She does not know of anything that happened she has not gone barefoot she tells me that she wear shoes and again has not been outside barefoot at any point in time. Fortunately there does not appear to be any signs of active infection at this time. No fevers, chills, nausea, vomiting, or diarrhea. With that  being said she is on Keflex she was also on doxycycline previous. She does have a strong history of diabetes mellitus type 2 which is fairly uncontrolled she has not had her insulin for quite a while here it seems. I do not have the final A1c yet although her glucose in the hospital last week was 333. Nonetheless I do believe that this coupled with her smoking is going to limit her healing to some degree nonetheless we need to get into trying to get her into a better place with regard to this. Her diabetes again is a big deal as far as healing is concerned right now in my opinion being that it still out of control. She also has a cardiac pacemaker she does not really know exactly what happened she tells me she went into DKA was in the hospital and then subsequently had a pacemaker following. 06/03/2021 upon evaluation today patient appears to be doing well with regard to her wounds on the toes. In general I think that she is doing excellent in my opinion. I do not see any signs of active infection which is great news. No fevers, chills, nausea, vomiting, or diarrhea. Unfortunately the alginate has been sticking to the wound beds. Electronic Signature(s) Signed: 06/03/2021 9:15:21 AM By: [name] PA-C Entered By: [name] on 06/03/2021 09:15:21 -------------------------------------------------------------------------------- Physical Exam Details Patient Name: Date of Service: [name], [name] 06/03/2021 8:30 A M Medical Record Number: [id number] Patient Account Number: [id number] Date of Birth/Sex: Treating RN: 1971-12-04 (50 y.o. [name] Primary Care Provider: Asencion Noble Other Clinician: Referring Provider: Treating Provider/Extender: Emelda Brothers in Treatment: 1 Constitutional Well-nourished and well-hydrated in no acute distress. Respiratory normal breathing without difficulty. Psychiatric  this patient is able to make decisions and  demonstrates good insight into disease process. Alert and Oriented x 3. pleasant and cooperative. Notes Patient's wound bed showed signs of good granulation epithelization at this point. There does not appear to be any evidence of active infection which is great news and overall very pleased with where things stand from that standpoint. With that being said I do believe that we may want to switch over to Xeroform as I feel like this is can be a better option being how dry her wounds are. Electronic Signature(s) Signed: 06/03/2021 9:15:52 AM By: [name] PA-C Entered By: [name] on 06/03/2021 09:15:52 -------------------------------------------------------------------------------- Physician Orders Details Patient Name: Date of Service: Sarah Ferrari EL R. 06/03/2021 8:30 A M Medical Record Number: [id number] Patient Account Number: [id number] Date of Birth/Sex: Treating RN: 1971/09/21 (50 y.o. [name] Primary Care Provider: Asencion Noble Other Clinician: Referring Provider: Treating Provider/Extender: Emelda Brothers in Treatment: 1 Verbal / Phone Orders: No Diagnosis Coding ICD-10 Coding Code Description E11.621 Type 2 diabetes mellitus with foot ulcer L97.522 Non-pressure chronic ulcer of other part of left foot with fat layer exposed L97.512 Non-pressure chronic ulcer of other part of right foot with fat layer exposed E11.43 Type 2 diabetes mellitus with diabetic autonomic (poly)neuropathy I10 Essential (primary) hypertension Z95.0 Presence of cardiac pacemaker Follow-up Appointments ppointment in 1 week. - with Margarita Grizzle Return A Bathing/ Shower/ Hygiene May shower and wash wound with soap and water. Edema Control - Lymphedema / SCD / Other Elevate legs to the level of the heart or above for 30 minutes daily and/or when sitting, a frequency of: - throughout the day Avoid standing for long periods of time. Wound Treatment Wound #1 - T  Great oe Wound Laterality: Left Topical: Mupirocin Ointment Every Other Day/15 Days Discharge Instructions: Apply Mupirocin (Bactroban) thin film to open areas Prim Dressing: Xeroform Occlusive Gauze Dressing, 4x4 in Every Other Day/15 Days ary Discharge Instructions: Apply to wound bed as instructed Secondary Dressing: Woven Gauze Sponges 2x2 in Every Other Day/15 Days Discharge Instructions: Apply over primary dressing as directed. Secured With: Child psychotherapist, Sterile 2x75 (in/in) Every Other Day/15 Days Discharge Instructions: Secure with stretch gauze . wrap toes individually Wound #2 - T Second oe Wound Laterality: Left Topical: Mupirocin Ointment Every Other Day/15 Days Discharge Instructions: Apply Mupirocin (Bactroban) thin film to open areas Prim Dressing: Xeroform Occlusive Gauze Dressing, 4x4 in Every Other Day/15 Days ary Discharge Instructions: Apply to wound bed as instructed Secondary Dressing: Woven Gauze Sponges 2x2 in Every Other Day/15 Days Discharge Instructions: Apply over primary dressing as directed. Secured With: Child psychotherapist, Sterile 2x75 (in/in) Every Other Day/15 Days Discharge Instructions: Secure with stretch gauze . wrap toes individually Wound #3 - T Great oe Wound Laterality: Right Topical: Mupirocin Ointment Every Other Day/15 Days Discharge Instructions: Apply Mupirocin (Bactroban) thin film to open areas Prim Dressing: Xeroform Occlusive Gauze Dressing, 4x4 in Every Other Day/15 Days ary Discharge Instructions: Apply to wound bed as instructed Secondary Dressing: Woven Gauze Sponges 2x2 in Every Other Day/15 Days Discharge Instructions: Apply over primary dressing as directed. Secured With: Child psychotherapist, Sterile 2x75 (in/in) Every Other Day/15 Days Discharge Instructions: Secure with stretch gauze . wrap toes individually Wound #4 - T Second oe Wound Laterality: Right Topical: Mupirocin  Ointment Every Other Day/15 Days Discharge Instructions: Apply Mupirocin (Bactroban) thin film to open areas Prim Dressing: Xeroform Occlusive Gauze Dressing,  4x4 in Every Other Day/15 Days ary Discharge Instructions: Apply to wound bed as instructed Secondary Dressing: Woven Gauze Sponges 2x2 in Every Other Day/15 Days Discharge Instructions: Apply over primary dressing as directed. Secured With: Child psychotherapist, Sterile 2x75 (in/in) Every Other Day/15 Days Discharge Instructions: Secure with stretch gauze . wrap toes individually Wound #5 - T Fifth oe Wound Laterality: Left Topical: Mupirocin Ointment Every Other Day/15 Days Discharge Instructions: Apply Mupirocin (Bactroban) thin film to open areas Prim Dressing: Xeroform Occlusive Gauze Dressing, 4x4 in Every Other Day/15 Days ary Discharge Instructions: Apply to wound bed as instructed Secondary Dressing: Woven Gauze Sponges 2x2 in Every Other Day/15 Days Discharge Instructions: Apply over primary dressing as directed. Secured With: Child psychotherapist, Sterile 2x75 (in/in) Every Other Day/15 Days Discharge Instructions: Secure with stretch gauze . wrap toes individually Electronic Signature(s) Signed: 06/03/2021 4:36:36 PM By: [name] PA-C Signed: 06/03/2021 5:41:46 PM By: Baruch Gouty RN, BSN Entered By: Baruch Gouty on 06/03/2021 09:14:07 -------------------------------------------------------------------------------- Problem List Details Patient Name: Date of Service: Sarah Ferrari EL R. 06/03/2021 8:30 A M Medical Record Number: [id number] Patient Account Number: [id number] Date of Birth/Sex: Treating RN: June 13, 1971 (50 y.o. [name] Primary Care Provider: Asencion Noble Other Clinician: Referring Provider: Treating Provider/Extender: Emelda Brothers in Treatment: 1 Active Problems ICD-10 Encounter Code Description Active Date  MDM Diagnosis E11.621 Type 2 diabetes mellitus with foot ulcer 05/27/2021 No Yes L97.522 Non-pressure chronic ulcer of other part of left foot with fat layer exposed 05/27/2021 No Yes L97.512 Non-pressure chronic ulcer of other part of right foot with fat layer exposed 05/27/2021 No Yes E11.43 Type 2 diabetes mellitus with diabetic autonomic (poly)neuropathy 05/27/2021 No Yes I10 Essential (primary) hypertension 05/27/2021 No Yes Z95.0 Presence of cardiac pacemaker 05/27/2021 No Yes Inactive Problems Resolved Problems Electronic Signature(s) Signed: 06/03/2021 8:59:22 AM By: [name] PA-C Entered By: [name] on 06/03/2021 08:59:22 -------------------------------------------------------------------------------- Progress Note Details Patient Name: Date of Service: Sarah Ferrari EL R. 06/03/2021 8:30 A M Medical Record Number: [id number] Patient Account Number: [id number] Date of Birth/Sex: Treating RN: 03-11-1971 (50 y.o. [name] Primary Care Provider: Asencion Noble Other Clinician: Referring Provider: Treating Provider/Extender: Emelda Brothers in Treatment: 1 Subjective Chief Complaint Information obtained from Patient Bilateral toe ulcers History of Present Illness (HPI) 05/27/2021 upon evaluation today patient appears to be doing poorly in regard to her toes on the left and right foot. She has the first and second toe on the left foot as well as the fifth toe. On the right foot is just the first and second toe. With that being said she does not know any injury or otherwise that occurred. She states she just looked down and noticed that it was there and again that was just within the last week or so around 13 June. She does not know of anything that happened she has not gone barefoot she tells me that she wear shoes and again has not been outside barefoot at any point in time. Fortunately there does not appear to be any signs of active  infection at this time. No fevers, chills, nausea, vomiting, or diarrhea. With that being said she is on Keflex she was also on doxycycline previous. She does have a strong history of diabetes mellitus type 2 which is fairly uncontrolled she has not had her insulin for quite a while here it seems. I do not have the final A1c yet  although her glucose in the hospital last week was 333. Nonetheless I do believe that this coupled with her smoking is going to limit her healing to some degree nonetheless we need to get into trying to get her into a better place with regard to this. Her diabetes again is a big deal as far as healing is concerned right now in my opinion being that it still out of control. She also has a cardiac pacemaker she does not really know exactly what happened she tells me she went into DKA was in the hospital and then subsequently had a pacemaker following. 06/03/2021 upon evaluation today patient appears to be doing well with regard to her wounds on the toes. In general I think that she is doing excellent in my opinion. I do not see any signs of active infection which is great news. No fevers, chills, nausea, vomiting, or diarrhea. Unfortunately the alginate has been sticking to the wound beds. Objective Constitutional Well-nourished and well-hydrated in no acute distress. Vitals Time Taken: 8:32 AM, Height: 64 in, Weight: 200 lbs, BMI: 34.3, Temperature: 98.3 F, Pulse: 94 bpm, Respiratory Rate: 15 breaths/min, Blood Pressure: 153/91 mmHg, Capillary Blood Glucose: 258 mg/dl, Pulse Oximetry: 94 %. General Notes: glucose per pt report yesterday Respiratory normal breathing without difficulty. Psychiatric this patient is able to make decisions and demonstrates good insight into disease process. Alert and Oriented x 3. pleasant and cooperative. General Notes: Patient's wound bed showed signs of good granulation epithelization at this point. There does not appear to be any evidence  of active infection which is great news and overall very pleased with where things stand from that standpoint. With that being said I do believe that we may want to switch over to Xeroform as I feel like this is can be a better option being how dry her wounds are. Integumentary (Hair, Skin) Wound #1 status is Open. Original cause of wound was Other Lesion. The date acquired was: 05/18/2021. The wound has been in treatment 1 weeks. The wound is located on the Left T Great. The wound measures 2.9cm length x 2.4cm width x 0.1cm depth; 5.466cm^2 area and 0.547cm^3 volume. There is Fat Layer oe (Subcutaneous Tissue) exposed. There is no tunneling or undermining noted. There is a medium amount of serosanguineous drainage noted. The wound margin is distinct with the outline attached to the wound base. There is large (67-100%) red granulation within the wound bed. There is a small (1-33%) amount of necrotic tissue within the wound bed including Adherent Slough. Wound #2 status is Open. Original cause of wound was Other Lesion. The date acquired was: 05/18/2021. The wound has been in treatment 1 weeks. The wound is located on the Left T Second. The wound measures 2cm length x 2.2cm width x 0.1cm depth; 3.456cm^2 area and 0.346cm^3 volume. There is Fat Layer oe (Subcutaneous Tissue) exposed. There is no tunneling or undermining noted. There is a medium amount of serosanguineous drainage noted. The wound margin is distinct with the outline attached to the wound base. There is large (67-100%) red granulation within the wound bed. There is a small (1-33%) amount of necrotic tissue within the wound bed including Adherent Slough. Wound #3 status is Open. Original cause of wound was Other Lesion. The date acquired was: 05/18/2021. The wound has been in treatment 1 weeks. The wound is located on the Right T Great. The wound measures 4.7cm length x 2.4cm width x 0.1cm depth; 8.859cm^2 area and 0.886cm^3 volume. There is  Fat Layer oe (Subcutaneous Tissue) exposed. There is no tunneling or undermining noted. There is a medium amount of serosanguineous drainage noted. The wound margin is distinct with the outline attached to the wound base. There is large (67-100%) red granulation within the wound bed. There is a small (1-33%) amount of necrotic tissue within the wound bed including Adherent Slough. Wound #4 status is Open. Original cause of wound was Other Lesion. The date acquired was: 05/18/2021. The wound has been in treatment 1 weeks. The wound is located on the Right T Second. The wound measures 2cm length x 1.7cm width x 0.1cm depth; 2.67cm^2 area and 0.267cm^3 volume. There is Fat Layer oe (Subcutaneous Tissue) exposed. There is no tunneling or undermining noted. There is a medium amount of serosanguineous drainage noted. The wound margin is distinct with the outline attached to the wound base. There is large (67-100%) red granulation within the wound bed. There is a small (1-33%) amount of necrotic tissue within the wound bed including Adherent Slough. Wound #5 status is Open. Original cause of wound was Blister. The date acquired was: 05/18/2021. The wound has been in treatment 1 weeks. The wound is located on the Left T Fifth. The wound measures 1.4cm length x 0.7cm width x 0.1cm depth; 0.77cm^2 area and 0.077cm^3 volume. There is Fat Layer oe (Subcutaneous Tissue) exposed. There is no tunneling or undermining noted. There is a medium amount of serous drainage noted. The wound margin is distinct with the outline attached to the wound base. There is large (67-100%) pink granulation within the wound bed. There is no necrotic tissue within the wound bed. Assessment Active Problems ICD-10 Type 2 diabetes mellitus with foot ulcer Non-pressure chronic ulcer of other part of left foot with fat layer exposed Non-pressure chronic ulcer of other part of right foot with fat layer exposed Type 2 diabetes mellitus  with diabetic autonomic (poly)neuropathy Essential (primary) hypertension Presence of cardiac pacemaker Plan Follow-up Appointments: Return Appointment in 1 week. - with Glynn Octave Shower/ Hygiene: May shower and wash wound with soap and water. Edema Control - Lymphedema / SCD / Other: Elevate legs to the level of the heart or above for 30 minutes daily and/or when sitting, a frequency of: - throughout the day Avoid standing for long periods of time. WOUND #1: - T Great Wound Laterality: Left oe Topical: Mupirocin Ointment Every Other Day/15 Days Discharge Instructions: Apply Mupirocin (Bactroban) thin film to open areas Prim Dressing: Xeroform Occlusive Gauze Dressing, 4x4 in Every Other Day/15 Days ary Discharge Instructions: Apply to wound bed as instructed Secondary Dressing: Woven Gauze Sponges 2x2 in Every Other Day/15 Days Discharge Instructions: Apply over primary dressing as directed. Secured With: Child psychotherapist, Sterile 2x75 (in/in) Every Other Day/15 Days Discharge Instructions: Secure with stretch gauze . wrap toes individually WOUND #2: - T Second Wound Laterality: Left oe Topical: Mupirocin Ointment Every Other Day/15 Days Discharge Instructions: Apply Mupirocin (Bactroban) thin film to open areas Prim Dressing: Xeroform Occlusive Gauze Dressing, 4x4 in Every Other Day/15 Days ary Discharge Instructions: Apply to wound bed as instructed Secondary Dressing: Woven Gauze Sponges 2x2 in Every Other Day/15 Days Discharge Instructions: Apply over primary dressing as directed. Secured With: Child psychotherapist, Sterile 2x75 (in/in) Every Other Day/15 Days Discharge Instructions: Secure with stretch gauze . wrap toes individually WOUND #3: - T Great Wound Laterality: Right oe Topical: Mupirocin Ointment Every Other Day/15 Days Discharge Instructions: Apply Mupirocin (Bactroban) thin film to open areas Prim Dressing:  Xeroform Occlusive  Gauze Dressing, 4x4 in Every Other Day/15 Days ary Discharge Instructions: Apply to wound bed as instructed Secondary Dressing: Woven Gauze Sponges 2x2 in Every Other Day/15 Days Discharge Instructions: Apply over primary dressing as directed. Secured With: Child psychotherapist, Sterile 2x75 (in/in) Every Other Day/15 Days Discharge Instructions: Secure with stretch gauze . wrap toes individually WOUND #4: - T Second Wound Laterality: Right oe Topical: Mupirocin Ointment Every Other Day/15 Days Discharge Instructions: Apply Mupirocin (Bactroban) thin film to open areas Prim Dressing: Xeroform Occlusive Gauze Dressing, 4x4 in Every Other Day/15 Days ary Discharge Instructions: Apply to wound bed as instructed Secondary Dressing: Woven Gauze Sponges 2x2 in Every Other Day/15 Days Discharge Instructions: Apply over primary dressing as directed. Secured With: Child psychotherapist, Sterile 2x75 (in/in) Every Other Day/15 Days Discharge Instructions: Secure with stretch gauze . wrap toes individually WOUND #5: - T Fifth Wound Laterality: Left oe Topical: Mupirocin Ointment Every Other Day/15 Days Discharge Instructions: Apply Mupirocin (Bactroban) thin film to open areas Prim Dressing: Xeroform Occlusive Gauze Dressing, 4x4 in Every Other Day/15 Days ary Discharge Instructions: Apply to wound bed as instructed Secondary Dressing: Woven Gauze Sponges 2x2 in Every Other Day/15 Days Discharge Instructions: Apply over primary dressing as directed. Secured With: Child psychotherapist, Sterile 2x75 (in/in) Every Other Day/15 Days Discharge Instructions: Secure with stretch gauze . wrap toes individually 1. Would recommend currently that we have the patient go ahead and continue with the wound care measures as before and the patient is in agreement with the plan. This includes the use of mupirocin followed by Xeroform gauze which I think is can be a better way to  go currently. She should wrap each toe individually so they do not get pushed together. 2. I am also can recommend that we have the patient continue to monitor for infection she can wash with soap and water or even her wound cleanser I am definitely fine with that anyway. 3. I am also going to recommend that she monitor for any signs of worsening in general. If she has any issues she should contact the office and let me know otherwise I will continue to see her for the time being weekly to keep things under control. We will see patient back for reevaluation in 1 week here in the clinic. If anything worsens or changes patient will contact our office for additional recommendations. Electronic Signature(s) Signed: 06/03/2021 9:16:53 AM By: [name] PA-C Entered By: [name] on 06/03/2021 09:16:53 -------------------------------------------------------------------------------- SuperBill Details Patient Name: Date of Service: Orland Mustard. 06/03/2021 Medical Record Number: [id number] Patient Account Number: [id number] Date of Birth/Sex: Treating RN: 1971-10-21 (50 y.o. 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 L97.512 Non-pressure chronic ulcer of other part of right foot with fat layer exposed E11.43 Type 2 diabetes mellitus with diabetic autonomic (poly)neuropathy I10 Essential (primary) hypertension Z95.0 Presence of cardiac pacemaker Facility Procedures CPT4 Code: [id number] Description: [phone] - WOUND CARE VISIT-LEV 5 EST PT Modifier: Quantity: 1 Physician Procedures : CPT4 Code Description Modifier [id number] - WC PHYS LEVEL 4 - EST PT ICD-10 Diagnosis  Description E11.621 Type 2 diabetes mellitus with foot ulcer L97.522 Non-pressure chronic ulcer of other part of  left foot with fat layer exposed L97.512  Non-pressure chronic ulcer of other part of right foot with fat layer exposed E11.43 Type 2 diabetes mellitus with diabetic autonomic (poly)neuropathy Quantity: 1 Electronic Signature(s) Signed: 06/03/2021 9:17:08 AM By: [name] PA-C Entered By: [name] on 06/03/2021 09:17:07

## 2021-06-09 ENCOUNTER — Other Ambulatory Visit (HOSPITAL_COMMUNITY): Payer: Self-pay | Admitting: Cardiovascular Disease

## 2021-06-09 ENCOUNTER — Ambulatory Visit (INDEPENDENT_AMBULATORY_CARE_PROVIDER_SITE_OTHER): Payer: Medicaid Other | Admitting: Cardiovascular Disease

## 2021-06-09 ENCOUNTER — Encounter: Payer: Self-pay | Admitting: Cardiovascular Disease

## 2021-06-09 ENCOUNTER — Other Ambulatory Visit: Payer: Self-pay

## 2021-06-09 VITALS — BP 130/76 | Resp 18 | Ht 64.0 in | Wt 206.6 lb

## 2021-06-09 DIAGNOSIS — E785 Hyperlipidemia, unspecified: Secondary | ICD-10-CM

## 2021-06-09 DIAGNOSIS — I739 Peripheral vascular disease, unspecified: Secondary | ICD-10-CM | POA: Diagnosis not present

## 2021-06-09 DIAGNOSIS — Z01818 Encounter for other preprocedural examination: Secondary | ICD-10-CM | POA: Diagnosis not present

## 2021-06-09 DIAGNOSIS — Z720 Tobacco use: Secondary | ICD-10-CM | POA: Diagnosis not present

## 2021-06-09 DIAGNOSIS — Z950 Presence of cardiac pacemaker: Secondary | ICD-10-CM

## 2021-06-09 LAB — BASIC METABOLIC PANEL
BUN/Creatinine Ratio: 19 (ref 9–23)
BUN: 21 mg/dL (ref 6–24)
CO2: 20 mmol/L (ref 20–29)
Calcium: 8.9 mg/dL (ref 8.7–10.2)
Chloride: 96 mmol/L (ref 96–106)
Creatinine, Ser: 1.1 mg/dL — ABNORMAL HIGH (ref 0.57–1.00)
Glucose: 486 mg/dL — ABNORMAL HIGH (ref 65–99)
Potassium: 5 mmol/L (ref 3.5–5.2)
Sodium: 130 mmol/L — ABNORMAL LOW (ref 134–144)
eGFR: 62 mL/min/{1.73_m2} (ref >59)

## 2021-06-09 LAB — CBC
Hematocrit: 30.7 % — ABNORMAL LOW (ref 34.0–46.6)
Hemoglobin: 10 g/dL — ABNORMAL LOW (ref 11.1–15.9)
MCH: 29.1 pg (ref 26.6–33.0)
MCHC: 32.6 g/dL (ref 31.5–35.7)
MCV: 89 fL (ref 79–97)
Platelets: 312 10*3/uL (ref 150–450)
RBC: 3.44 x10E6/uL — ABNORMAL LOW (ref 3.77–5.28)
RDW: 13 % (ref 11.7–15.4)
WBC: 10 10*3/uL (ref 3.4–10.8)

## 2021-06-09 MED ORDER — ROSUVASTATIN CALCIUM 10 MG PO TABS
10.0000 mg | ORAL_TABLET | Freq: Every day | ORAL | 3 refills | Status: DC
  Filled 2021-06-09 – 2021-06-10 (×2): qty 90, 90d supply, fill #0

## 2021-06-09 NOTE — Patient Instructions (Addendum)
Medication Instructions:  START Rosuvastatin 10 mg once daily  *If you need a refill on your cardiac medications before your next appointment, please call your pharmacy*  Testing/Procedures: Your physician has requested that you have a peripheral vascular angiogram. This exam is performed at the hospital. During this exam IV contrast is used to look at arterial blood flow. Please review the information sheet given for details.  Your physician has requested that you have a lower extremity segmental doppler. This will take place at [place], [address].  Follow-Up: At [facility], you and your health needs are our priority.  As part of our continuing mission to provide you with exceptional heart care, we have created designated Provider Care Teams.  These Care Teams include your primary Cardiologist (physician) and Advanced Practice Providers (APPs -  Physician Assistants and Nurse Practitioners) who all work together to provide you with the care you need, when you need it.  We recommend signing up for the patient portal called "MyChart".  Sign up information is provided on this After Visit Summary.  MyChart is used to connect with patients for Virtual Visits (Telemedicine).  Patients are able to view lab/test results, encounter notes, upcoming appointments, etc.  Non-urgent messages can be sent to your provider as well.   To learn more about what you can do with MyChart, go to [email or web address].    Your next appointment:   Keep your post procedure follow up with Dr. Fletcher Anon on 07/07/21 at 9:20 am  Other Instructions    [address] [place] [address] Dept: [phone] Loc: [place]  06/09/2021  You are scheduled for a Peripheral Angiogram on Wednesday, July 13th with Dr. [name].  1. Please arrive at the [facility] (Main Entrance A) at [facility]: [address] [place], [place] [address] at 6:30 AM (This time is two hours before your procedure to ensure your preparation). Free valet parking service is available.   Special note: Every effort is made to have your procedure done on time. Please understand that emergencies sometimes delay scheduled procedures.  2. Diet: Do not eat solid foods after midnight.  The patient may have clear liquids until 5am upon the day of the procedure.  3. Labs: You will need to have blood drawn today 06/09/21  4. Medication instructions in preparation for your procedure: Hold all of your diabetic medication the morning of the procedure  On the morning of your procedure, take your Aspirin and any morning medicines NOT listed above.  You may use sips of water.  5. Plan for one night stay--bring personal belongings. 6. Bring a current list of your medications and current insurance cards. 7. You MUST have a responsible person to drive you home. 8. Someone MUST be with you the first 24 hours after you arrive home or your discharge will be delayed. 9. Please wear clothes that are easy to get on and off and wear slip-on shoes.  Thank you for allowing [place] to care for you!   -- [facility]

## 2021-06-09 NOTE — Progress Notes (Signed)
Sarah, Long ([id number]) Visit Report for 06/03/2021 Arrival Information Details Patient Name: Date of Service: Sarah Long, Sarah Long 06/03/2021 8:30 A M Medical Record Number: [id number] Patient Account Number: [id number] Date of Birth/Sex: Treating RN: 1971/09/06 (50 y.o. [name] Primary Care Andrena Margerum: Asencion Noble Other Clinician: Referring Darienne Belleau: Treating Ravynn Hogate/Extender: Emelda Brothers in Treatment: 1 Visit Information History Since Last Visit Added or deleted any medications: No Patient Arrived: Ambulatory Any new allergies or adverse reactions: No Arrival Time: 08:29 Had a fall or experienced change in No Transfer Assistance: None activities of daily living that may affect Patient Identification Verified: Yes risk of falls: Secondary Verification Process Completed: Yes Signs or symptoms of abuse/neglect since last visito No Patient Requires Transmission-Based Precautions: No Hospitalized since last visit: No Patient Has Alerts: Yes Implantable device outside of the clinic excluding No Patient Alerts: Pacemaker cellular tissue based products placed in the center Bilat ABI=[place] since last visit: Has Dressing in Place as Prescribed: Yes Pain Present Now: No Electronic Signature(s) Signed: 06/03/2021 5:38:09 PM By: Lorrin Jackson Entered By: Lorrin Jackson on 06/03/2021 08:31:12 -------------------------------------------------------------------------------- Clinic Level of Care Assessment Details Patient Name: Date of Service: DONICIA, Long 06/03/2021 8:30 A M Medical Record Number: [id number] Patient Account Number: [id number] Date of Birth/Sex: Treating RN: 1971-01-07 (50 y.o. [name] Primary Care Darriana Deboy: Asencion Noble Other Clinician: Referring Franchesca Veneziano: Treating Eydan Chianese/Extender: Emelda Brothers in Treatment: 1 Clinic Level of Care Assessment Items TOOL 4 Quantity Score []  - 0 Use when  only an EandM is performed on FOLLOW-UP visit ASSESSMENTS - Nursing Assessment / Reassessment X- 1 10 Reassessment of Co-morbidities (includes updates in patient status) X- 1 5 Reassessment of Adherence to Treatment Plan ASSESSMENTS - Wound and Skin A ssessment / Reassessment []  - 0 Simple Wound Assessment / Reassessment - one wound X- 5 5 Complex Wound Assessment / Reassessment - multiple wounds []  - 0 Dermatologic / Skin Assessment (not related to wound area) ASSESSMENTS - Focused Assessment X- 2 5 Circumferential Edema Measurements - multi extremities []  - 0 Nutritional Assessment / Counseling / Intervention X- 1 5 Lower Extremity Assessment (monofilament, tuning fork, pulses) []  - 0 Peripheral Arterial Disease Assessment (using hand held doppler) ASSESSMENTS - Ostomy and/or Continence Assessment and Care []  - 0 Incontinence Assessment and Management []  - 0 Ostomy Care Assessment and Management (repouching, etc.) PROCESS - Coordination of Care X - Simple Patient / Family Education for ongoing care 1 15 []  - 0 Complex (extensive) Patient / Family Education for ongoing care X- 1 10 Staff obtains Programmer, systems, Records, T Results / Process Orders est []  - 0 Staff telephones HHA, Nursing Homes / Clarify orders / etc []  - 0 Routine Transfer to another Facility (non-emergent condition) []  - 0 Routine Hospital Admission (non-emergent condition) []  - 0 New Admissions / Biomedical engineer / Ordering NPWT Apligraf, etc. , []  - 0 Emergency Hospital Admission (emergent condition) X- 1 10 Simple Discharge Coordination []  - 0 Complex (extensive) Discharge Coordination PROCESS - Special Needs []  - 0 Pediatric / Minor Patient Management []  - 0 Isolation Patient Management []  - 0 Hearing / Language / Visual special needs []  - 0 Assessment of Community assistance (transportation, D/C planning, etc.) []  - 0 Additional assistance / Altered mentation []  - 0 Support  Surface(s) Assessment (bed, cushion, seat, etc.) INTERVENTIONS - Wound Cleansing / Measurement []  - 0 Simple Wound Cleansing - one wound X- 5 5 Complex Wound Cleansing -  multiple wounds X- 1 5 Wound Imaging (photographs - any number of wounds) []  - 0 Wound Tracing (instead of photographs) []  - 0 Simple Wound Measurement - one wound X- 5 5 Complex Wound Measurement - multiple wounds INTERVENTIONS - Wound Dressings X - Small Wound Dressing one or multiple wounds 5 10 []  - 0 Medium Wound Dressing one or multiple wounds []  - 0 Large Wound Dressing one or multiple wounds X- 1 5 Application of Medications - topical []  - 0 Application of Medications - injection INTERVENTIONS - Miscellaneous []  - 0 External ear exam []  - 0 Specimen Collection (cultures, biopsies, blood, body fluids, etc.) []  - 0 Specimen(s) / Culture(s) sent or taken to Lab for analysis []  - 0 Patient Transfer (multiple staff / Civil Service fast streamer / Similar devices) []  - 0 Simple Staple / Suture removal (25 or less) []  - 0 Complex Staple / Suture removal (26 or more) []  - 0 Hypo / Hyperglycemic Management (close monitor of Blood Glucose) []  - 0 Ankle / Brachial Index (ABI) - do not check if billed separately X- 1 5 Vital Signs Has the patient been seen at the hospital within the last three years: Yes Total Score: 205 Level Of Care: New/Established - Level 5 Electronic Signature(s) Signed: 06/03/2021 5:41:46 PM By: Baruch Gouty RN, BSN Entered By: Baruch Gouty on 06/03/2021 09:10:05 -------------------------------------------------------------------------------- Encounter Discharge Information Details Patient Name: Date of Service: Sarah Ferrari EL R. 06/03/2021 8:30 A M Medical Record Number: [id number] Patient Account Number: [id number] Date of Birth/Sex: Treating RN: September 15, 1971 (50 y.o. Nita Sells Primary Care Akshat Minehart: Asencion Noble Other Clinician: Referring Isaac Lacson: Treating Briannia Laba/Extender:  Emelda Brothers in Treatment: 1 Encounter Discharge Information Items Discharge Condition: Stable Ambulatory Status: Ambulatory Discharge Destination: Home Transportation: Private Auto Accompanied By: alone Schedule Follow-up Appointment: No Clinical Summary of Care: Electronic Signature(s) Signed: 06/09/2021 6:17:43 PM By: Leane Call Entered By: Leane Call on 06/03/2021 16:56:56 -------------------------------------------------------------------------------- Lower Extremity Assessment Details Patient Name: Date of Service: LENISE, JR R. 06/03/2021 8:30 A M Medical Record Number: [id number] Patient Account Number: [id number] Date of Birth/Sex: Treating RN: 08-09-1971 (50 y.o. [name] Primary Care Claudia Greenley: Asencion Noble Other Clinician: Referring Omayra Tulloch: Treating Julius Matus/Extender: Emelda Brothers in Treatment: 1 Edema Assessment Assessed: Shirlyn Goltz: Yes] Patrice Paradise: Yes] Edema: [Left: No] [Right: No] Calf Left: Right: Point of Measurement: 31 cm From Medial Instep 35.5 cm 36 cm Ankle Left: Right: Point of Measurement: 8 cm From Medial Instep 24 cm 23 cm Vascular Assessment Pulses: Dorsalis Pedis Palpable: [Left:Yes] [Right:Yes] Electronic Signature(s) Signed: 06/03/2021 5:38:09 PM By: Lorrin Jackson Entered By: Lorrin Jackson on 06/03/2021 08:36:09 -------------------------------------------------------------------------------- Multi-Disciplinary Care Plan Details Patient Name: Date of Service: [name] 06/03/2021 8:30 A M Medical Record Number: [id number] Patient Account Number: [id number] Date of Birth/Sex: Treating RN: 02/02/1971 (50 y.o. [name] Primary Care Kalenna Millett: Asencion Noble Other Clinician: Referring Rayli Wiederhold: Treating Hoorain Kozakiewicz/Extender: Emelda Brothers in Treatment: 1 [place] reviewed with physician Active  Inactive Nutrition Nursing Diagnoses: Impaired glucose control: actual or potential Potential for alteratiion in Nutrition/Potential for imbalanced nutrition Goals: Patient/caregiver verbalizes understanding of need to maintain therapeutic glucose control per primary care physician Date Initiated: 05/27/2021 Target Resolution Date: 06/24/2021 Goal Status: Active Patient/caregiver will maintain therapeutic glucose control Date Initiated: 05/27/2021 Target Resolution Date: 06/24/2021 Goal Status: Active Interventions: Assess patient nutrition upon admission and as needed per policy Provide education on nutrition Treatment Activities: Patient referred to  Primary Care Physician for further nutritional evaluation : 05/27/2021 Notes: Wound/Skin Impairment Nursing Diagnoses: Impaired tissue integrity Knowledge deficit related to smoking impact on wound healing Knowledge deficit related to ulceration/compromised skin integrity Goals: Patient will demonstrate a reduced rate of smoking or cessation of smoking Date Initiated: 05/27/2021 Target Resolution Date: 06/24/2021 Goal Status: Active Patient/caregiver will verbalize understanding of skin care regimen Date Initiated: 05/27/2021 Target Resolution Date: 06/24/2021 Goal Status: Active Ulcer/skin breakdown will have a volume reduction of 30% by week 4 Date Initiated: 05/27/2021 Target Resolution Date: 06/24/2021 Goal Status: Active Interventions: Assess patient/caregiver ability to obtain necessary supplies Assess patient/caregiver ability to perform ulcer/skin care regimen upon admission and as needed Assess ulceration(s) every visit Provide education on smoking Provide education on ulcer and skin care Treatment Activities: Skin care regimen initiated : 05/27/2021 Topical wound management initiated : 05/27/2021 Notes: Electronic Signature(s) Signed: 06/03/2021 5:41:46 PM By: Baruch Gouty RN, BSN Entered By: Baruch Gouty on  06/03/2021 09:08:08 -------------------------------------------------------------------------------- Pain Assessment Details Patient Name: Date of Service: [name] 06/03/2021 8:30 A M Medical Record Number: [id number] Patient Account Number: [id number] Date of Birth/Sex: Treating RN: 04-22-1971 (50 y.o. [name] Primary Care Mona Ayars: Asencion Noble Other Clinician: Referring Marilynne Dupuis: Treating Maurya Nethery/Extender: Emelda Brothers in Treatment: 1 Active Problems Location of Pain Severity and Description of Pain Patient Has Paino No Site Locations Pain Management and Medication Current Pain Management: Electronic Signature(s) Signed: 06/03/2021 5:38:09 PM By: Lorrin Jackson Entered By: Lorrin Jackson on 06/03/2021 08:31:55 -------------------------------------------------------------------------------- Patient/Caregiver Education Details Patient Name: Date of Service: Orland Mustard 6/29/2022andnbsp8:30 A M Medical Record Number: [id number] Patient Account Number: [id number] Date of Birth/Gender: Treating RN: [birth year] (49 y.o. [name] Primary Care Physician: Asencion Noble Other Clinician: Referring Physician: Treating Physician/Extender: Emelda Brothers in Treatment: 1 Education Assessment Education Provided To: Patient Education Topics Provided Elevated Blood Sugar/ Impact on Healing: Methods: Explain/Verbal Responses: Reinforcements needed, State content correctly Smoking and Wound Healing: Methods: Explain/Verbal Responses: Reinforcements needed, State content correctly Wound/Skin Impairment: Methods: Explain/Verbal Responses: Reinforcements needed, State content correctly Electronic Signature(s) Signed: 06/03/2021 5:41:46 PM By: Baruch Gouty RN, BSN Entered By: Baruch Gouty on 06/03/2021 09:09:14 -------------------------------------------------------------------------------- Wound  Assessment Details Patient Name: Date of Service: [name] 06/03/2021 8:30 A M Medical Record Number: [id number] Patient Account Number: [id number] Date of Birth/Sex: Treating RN: [birth year] (50 y.o. [name] Primary Care Vann Okerlund: Asencion Noble Other Clinician: Referring Caleigha Zale: Treating Earsel Shouse/Extender: Emelda Brothers in Treatment: 1 Wound Status Wound Number: 1 Primary Etiology: Diabetic Wound/Ulcer of the Lower Extremity Wound Location: Left T Great oe Wound Status: Open Wounding Event: Other Lesion Comorbid History: Glaucoma, Hypertension, Type II Diabetes, Neuropathy Date Acquired: 05/18/2021 Weeks Of Treatment: 1 Clustered Wound: No Wound Measurements Length: (cm) 2.9 Width: (cm) 2.4 Depth: (cm) 0.1 Area: (cm) 5.466 Volume: (cm) 0.547 % Reduction in Area: 69.1% % Reduction in Volume: 84.5% Epithelialization: Small (1-33%) Tunneling: No Undermining: No Wound Description Classification: Grade 1 Wound Margin: Distinct, outline attached Exudate Amount: Medium Exudate Type: Serosanguineous Exudate Color: red, brown Foul Odor After Cleansing: No Slough/Fibrino Yes Wound Bed Granulation Amount: Large (67-100%) Exposed Structure Granulation Quality: Red Fascia Exposed: No Necrotic Amount: Small (1-33%) Fat Layer (Subcutaneous Tissue) Exposed: Yes Necrotic Quality: Adherent Slough Tendon Exposed: No Muscle Exposed: No Joint Exposed: No Bone Exposed: No Treatment Notes Wound #1 (Toe Great) Wound Laterality: Left Cleanser Peri-Wound Care Topical Mupirocin Ointment Discharge Instruction: Apply Mupirocin (  Bactroban) thin film to open areas Primary Dressing Xeroform Occlusive Gauze Dressing, 4x4 in Discharge Instruction: Apply to wound bed as instructed Secondary Dressing Woven Gauze Sponges 2x2 in Discharge Instruction: Apply over primary dressing as directed. Secured With Conforming Stretch Gauze Bandage,  Sterile 2x75 (in/in) Discharge Instruction: Secure with stretch gauze . wrap toes individually Compression Wrap Compression Stockings Add-Ons Electronic Signature(s) Signed: 06/03/2021 5:38:09 PM By: Lorrin Jackson Entered By: Lorrin Jackson on 06/03/2021 08:40:21 -------------------------------------------------------------------------------- Wound Assessment Details Patient Name: Date of Service: [name] 06/03/2021 8:30 A M Medical Record Number: [id number] Patient Account Number: [id number] Date of Birth/Sex: Treating RN: 1971/08/08 (50 y.o. [name] Primary Care Dalene Robards: Asencion Noble Other Clinician: Referring Kalla Watson: Treating Marlen Koman/Extender: Emelda Brothers in Treatment: 1 Wound Status Wound Number: 2 Primary Etiology: Diabetic Wound/Ulcer of the Lower Extremity Wound Location: Left T Second oe Wound Status: Open Wounding Event: Other Lesion Comorbid History: Glaucoma, Hypertension, Type II Diabetes, Neuropathy Date Acquired: 05/18/2021 Weeks Of Treatment: 1 Clustered Wound: No Wound Measurements Length: (cm) 2 Width: (cm) 2.2 Depth: (cm) 0.1 Area: (cm) 3.456 Volume: (cm) 0.346 % Reduction in Area: -43.8% % Reduction in Volume: 28.1% Epithelialization: None Tunneling: No Undermining: No Wound Description Classification: Grade 1 Wound Margin: Distinct, outline attached Exudate Amount: Medium Exudate Type: Serosanguineous Exudate Color: red, brown Foul Odor After Cleansing: No Slough/Fibrino Yes Wound Bed Granulation Amount: Large (67-100%) Exposed Structure Granulation Quality: Red Fascia Exposed: No Necrotic Amount: Small (1-33%) Fat Layer (Subcutaneous Tissue) Exposed: Yes Necrotic Quality: Adherent Slough Tendon Exposed: No Muscle Exposed: No Joint Exposed: No Bone Exposed: No Treatment Notes Wound #2 (Toe Second) Wound Laterality: Left Cleanser Peri-Wound Care Topical Mupirocin  Ointment Discharge Instruction: Apply Mupirocin (Bactroban) thin film to open areas Primary Dressing Xeroform Occlusive Gauze Dressing, 4x4 in Discharge Instruction: Apply to wound bed as instructed Secondary Dressing Woven Gauze Sponges 2x2 in Discharge Instruction: Apply over primary dressing as directed. Secured With Conforming Stretch Gauze Bandage, Sterile 2x75 (in/in) Discharge Instruction: Secure with stretch gauze . wrap toes individually Compression Wrap Compression Stockings Add-Ons Electronic Signature(s) Signed: 06/03/2021 5:38:09 PM By: Lorrin Jackson Entered By: Lorrin Jackson on 06/03/2021 08:41:11 -------------------------------------------------------------------------------- Wound Assessment Details Patient Name: Date of Service: [name] 06/03/2021 8:30 A M Medical Record Number: [id number] Patient Account Number: [id number] Date of Birth/Sex: Treating RN: July 12, 1971 (50 y.o. [name] Primary Care Tyria Springer: Asencion Noble Other Clinician: Referring Orlinda Slomski: Treating Deandrew Hoecker/Extender: Emelda Brothers in Treatment: 1 Wound Status Wound Number: 3 Primary Etiology: Diabetic Wound/Ulcer of the Lower Extremity Wound Location: Right T Great oe Wound Status: Open Wounding Event: Other Lesion Comorbid History: Glaucoma, Hypertension, Type II Diabetes, Neuropathy Date Acquired: 05/18/2021 Weeks Of Treatment: 1 Clustered Wound: No Wound Measurements Length: (cm) 4.7 Width: (cm) 2.4 Depth: (cm) 0.1 Area: (cm) 8.859 Volume: (cm) 0.886 % Reduction in Area: 48.7% % Reduction in Volume: 74.4% Epithelialization: Small (1-33%) Tunneling: No Undermining: No Wound Description Classification: Grade 1 Wound Margin: Distinct, outline attached Exudate Amount: Medium Exudate Type: Serosanguineous Exudate Color: red, brown Foul Odor After Cleansing: No Slough/Fibrino Yes Wound Bed Granulation Amount: Large (67-100%) Exposed  Structure Granulation Quality: Red Fascia Exposed: No Necrotic Amount: Small (1-33%) Fat Layer (Subcutaneous Tissue) Exposed: Yes Necrotic Quality: Adherent Slough Tendon Exposed: No Muscle Exposed: No Joint Exposed: No Bone Exposed: No Treatment Notes Wound #3 (Toe Great) Wound Laterality: Right Cleanser Peri-Wound Care Topical Mupirocin Ointment Discharge Instruction: Apply Mupirocin (Bactroban) thin film to  open areas Primary Dressing Xeroform Occlusive Gauze Dressing, 4x4 in Discharge Instruction: Apply to wound bed as instructed Secondary Dressing Woven Gauze Sponges 2x2 in Discharge Instruction: Apply over primary dressing as directed. Secured With Conforming Stretch Gauze Bandage, Sterile 2x75 (in/in) Discharge Instruction: Secure with stretch gauze . wrap toes individually Compression Wrap Compression Stockings Add-Ons Electronic Signature(s) Signed: 06/03/2021 5:38:09 PM By: Lorrin Jackson Entered By: Lorrin Jackson on 06/03/2021 08:41:38 -------------------------------------------------------------------------------- Wound Assessment Details Patient Name: Date of Service: [name] 06/03/2021 8:30 A M Medical Record Number: [id number] Patient Account Number: [id number] Date of Birth/Sex: Treating RN: 08-27-1971 (50 y.o. [name] Primary Care Jarvis Sawa: Asencion Noble Other Clinician: Referring Coda Mathey: Treating Joni Colegrove/Extender: Emelda Brothers in Treatment: 1 Wound Status Wound Number: 4 Primary Etiology: Diabetic Wound/Ulcer of the Lower Extremity Wound Location: Right T Second oe Wound Status: Open Wounding Event: Other Lesion Comorbid History: Glaucoma, Hypertension, Type II Diabetes, Neuropathy Date Acquired: 05/18/2021 Weeks Of Treatment: 1 Clustered Wound: No Wound Measurements Length: (cm) 2 Width: (cm) 1.7 Depth: (cm) 0.1 Area: (cm) 2.67 Volume: (cm) 0.267 % Reduction in Area: -51.1% % Reduction in  Volume: 24.4% Epithelialization: None Tunneling: No Undermining: No Wound Description Classification: Grade 1 Wound Margin: Distinct, outline attached Exudate Amount: Medium Exudate Type: Serosanguineous Exudate Color: red, brown Foul Odor After Cleansing: No Slough/Fibrino Yes Wound Bed Granulation Amount: Large (67-100%) Exposed Structure Granulation Quality: Red Fascia Exposed: No Necrotic Amount: Small (1-33%) Fat Layer (Subcutaneous Tissue) Exposed: Yes Necrotic Quality: Adherent Slough Tendon Exposed: No Muscle Exposed: No Joint Exposed: No Bone Exposed: No Treatment Notes Wound #4 (Toe Second) Wound Laterality: Right Cleanser Peri-Wound Care Topical Mupirocin Ointment Discharge Instruction: Apply Mupirocin (Bactroban) thin film to open areas Primary Dressing Xeroform Occlusive Gauze Dressing, 4x4 in Discharge Instruction: Apply to wound bed as instructed Secondary Dressing Woven Gauze Sponges 2x2 in Discharge Instruction: Apply over primary dressing as directed. Secured With Conforming Stretch Gauze Bandage, Sterile 2x75 (in/in) Discharge Instruction: Secure with stretch gauze . wrap toes individually Compression Wrap Compression Stockings Add-Ons Electronic Signature(s) Signed: 06/03/2021 5:38:09 PM By: Lorrin Jackson Entered By: Lorrin Jackson on 06/03/2021 08:42:02 -------------------------------------------------------------------------------- Wound Assessment Details Patient Name: Date of Service: [name] 06/03/2021 8:30 A M Medical Record Number: [id number] Patient Account Number: [id number] Date of Birth/Sex: Treating RN: May 18, 1971 (50 y.o. [name] Primary Care Odilia Damico: Asencion Noble Other Clinician: Referring Maiah Sinning: Treating Tiger Spieker/Extender: Emelda Brothers in Treatment: 1 Wound Status Wound Number: 5 Primary Etiology: Diabetic Wound/Ulcer of the Lower Extremity Wound Location: Left T Fifth oe  Wound Status: Open Wounding Event: Blister Comorbid History: Glaucoma, Hypertension, Type II Diabetes, Neuropathy Date Acquired: 05/18/2021 Weeks Of Treatment: 1 Clustered Wound: No Wound Measurements Length: (cm) 1.4 Width: (cm) 0.7 Depth: (cm) 0.1 Area: (cm) 0.77 Volume: (cm) 0.077 % Reduction in Area: 50.5% % Reduction in Volume: 50.6% Epithelialization: Medium (34-66%) Tunneling: No Undermining: No Wound Description Classification: Grade 1 Wound Margin: Distinct, outline attached Exudate Amount: Medium Exudate Type: Serous Exudate Color: amber Foul Odor After Cleansing: No Slough/Fibrino No Wound Bed Granulation Amount: Large (67-100%) Exposed Structure Granulation Quality: Pink Fascia Exposed: No Necrotic Amount: None Present (0%) Fat Layer (Subcutaneous Tissue) Exposed: Yes Tendon Exposed: No Muscle Exposed: No Joint Exposed: No Bone Exposed: No Treatment Notes Wound #5 (Toe Fifth) Wound Laterality: Left Cleanser Peri-Wound Care Topical Mupirocin Ointment Discharge Instruction: Apply Mupirocin (Bactroban) thin film to open areas Primary Dressing Xeroform Occlusive Gauze Dressing, 4x4 in  Discharge Instruction: Apply to wound bed as instructed Secondary Dressing Woven Gauze Sponges 2x2 in Discharge Instruction: Apply over primary dressing as directed. Secured With Conforming Stretch Gauze Bandage, Sterile 2x75 (in/in) Discharge Instruction: Secure with stretch gauze . wrap toes individually Compression Wrap Compression Stockings Add-Ons Electronic Signature(s) Signed: 06/03/2021 5:38:09 PM By: Lorrin Jackson Entered By: Lorrin Jackson on 06/03/2021 08:42:47 -------------------------------------------------------------------------------- Vitals Details Patient Name: Date of Service: Sarah Ferrari EL R. 06/03/2021 8:30 A M Medical Record Number: [id number] Patient Account Number: [id number] Date of Birth/Sex: Treating RN: 03/02/1971 (50 y.o. [name] Primary Care Macari Zalesky: Asencion Noble Other Clinician: Referring Athanasia Stanwood: Treating Zoiey Christy/Extender: Emelda Brothers in Treatment: 1 Vital Signs Time Taken: 08:32 Temperature (F): 98.3 Height (in): 64 Pulse (bpm): 94 Weight (lbs): 200 Respiratory Rate (breaths/min): 15 Body Mass Index (BMI): 34.3 Blood Pressure (mmHg): 153/91 Capillary Blood Glucose (mg/dl): 258 Reference Range: 80 - 120 mg / dl Airway Pulse Oximetry (%): 94 Notes glucose per pt report yesterday Electronic Signature(s) Signed: 06/03/2021 5:41:46 PM By: Baruch Gouty RN, BSN Entered By: Baruch Gouty on 06/03/2021 09:07:46

## 2021-06-09 NOTE — H&P (View-Only) (Signed)
Cardiology Office Note   Date:  06/10/2021   ID:  Sarah Long, DOB Apr 12, 1971, MRN [id number]  PCP:  [name], MD  Cardiologist:   Lorine Bears, MD   No chief complaint on file.     History of Present Illness: Sarah Long is a 50 y.o. female who was referred from the wound center for evaluation of possible peripheral arterial disease given poor healing ulceration on the lower extremities.  She has known history of poorly controlled diabetes, chronic diastolic heart failure, essential hypertension, hyperlipidemia and tobacco use.  She has history of complete heart block status post dual-chamber pacemaker placement done last year by Dr. Elberta Fortis.  Cardiac catheterization in March 2021 showed normal coronary arteries. She smokes half a pack per day and has been smoking for more than 20 years.  She has been diabetic since she was 50 years old. She with developed blisters and ulceration in the first and second toes of both feet few weeks ago without clear trauma.  She has open wounds on these areas with slow healing.  She also felt some dark discoloration of the tip of the toes and increased swelling.  She has no significant leg pain and she reports some numbness in the bottom of both feet with decreased sensation. No chest pain or worsening dyspnea.  Past Medical History:  Diagnosis Date   Abnormal Pap smear 1998   Abnormal vaginal bleeding 12/19/2011   Bacterial infection    Bipolar 1 disorder (HCC)    Blister of second toe of left foot 11/21/2016   Candida vaginitis 07/2007   Depression    recently added wellbutrin-has not taken yet for bipolar   Diabetes in pregnancy    Diabetes mellitus    nph 20U qam and qpm, regular with meals   Diabetic ketoacidosis without coma associated with type 2 diabetes mellitus (HCC)    Fibroid    Galactorrhea of right breast 2008   H/O amenorrhea 07/2007   H/O dizziness 10/14/11   H/O dysmenorrhea 2010   H/O menorrhagia 10/14/11   H/O  varicella    Headache(784.0)    Heavy vaginal bleeding due to contraceptive injection use 10/12/2011   Depo Provera   Herpes    HSV-2 infection 01/03/2009   Hx: UTI (urinary tract infection) 2009   Hypertension    on aldomet   Increased BMI 2010   Irregular uterine bleeding 04/04/2012   Pt has mirena    Obesity 10/14/11   Oligomenorrhea 07/2007   Pelvic pain in female    Preterm labor    Trichomonas    Yeast infection     Past Surgical History:  Procedure Laterality Date   CESAREAN SECTION  1991   LEFT HEART CATH AND CORONARY ANGIOGRAPHY N/A 02/10/2020   Procedure: LEFT HEART CATH AND CORONARY ANGIOGRAPHY;  Surgeon: Lennette Bihari, MD;  Location: [facility];  Service: Cardiovascular;  Laterality: N/A;   PACEMAKER IMPLANT N/A 02/13/2020   Procedure: PACEMAKER IMPLANT;  Surgeon: Regan Lemming, MD;  Location: [facility];  Service: Cardiovascular;  Laterality: N/A;   TEMPORARY PACEMAKER N/A 02/10/2020   Procedure: TEMPORARY PACEMAKER;  Surgeon: Lennette Bihari, MD;  Location: [facility] INVASIVE CV [facility];  Service: Cardiovascular;  Laterality: N/A;     Current Outpatient Medications  Medication Sig Dispense Refill   Accu-Chek Softclix Lancets lancets Use as instructed to check blood sugar three times daily. E11.65 100 each 6   acetaminophen (TYLENOL) 325  MG tablet Take 325 mg by mouth every 6 (six) hours as needed.     Blood Glucose Monitoring Suppl (ACCU-CHEK GUIDE ME) w/Device KIT 1 kit by Does not apply route in the morning, at noon, and at bedtime. Use as instructed to check blood sugar three times daily. E11.65 1 kit 0   Blood Glucose Monitoring Suppl (ACCU-CHEK GUIDE) w/Device KIT Use as directed daily 1 kit 0   cephALEXin (KEFLEX) 500 MG capsule Take 500 mg by mouth 2 (two) times daily.     diphenhydrAMINE (BENADRYL) 25 mg capsule Take 25 mg by mouth every 6 (six) hours as needed for allergies.     doxycycline (VIBRAMYCIN) 100 MG capsule Take 1 capsule (100 mg total) by  mouth 2 (two) times daily. 20 capsule 0   glucose blood (ACCU-CHEK GUIDE) test strip Use as instructed to check blood sugar three times daily. E11.65 100 each 6   glucose blood test strip Check your sugar in the morning before you eat breakfast, and one hour after a meal. 100 each 2   hydrOXYzine (ATARAX/VISTARIL) 25 MG tablet Take 1 tablet by mouth at bedtime as needed for sleep 30 tablet 1   insulin aspart (NOVOLOG FLEXPEN) 100 UNIT/ML FlexPen 10 unit with meals Sliding scale three times daily: 0-150: 0 151-200: 2 units  201-250: 4 units  251-300: 6 units  301-350: 8 units  351- 400 10 units   >400  12 units 15 mL 0   Insulin Pen Needle (B-D ULTRAFINE III SHORT PEN) 31G X 8 MM MISC 1 application by Does not apply route daily. 90 each 3   Lancet Devices (ADJUSTABLE LANCING DEVICE) MISC Inject 1 each into the skin 3 (three) times daily. 100 each 5   LATUDA 40 MG TABS tablet SMARTSIG:1 Tablet(s) By Mouth Every Evening     lurasidone (LATUDA) 40 MG TABS tablet Take 1 tablet by mouth every evening with meals to start on or after 04/30/21 30 tablet 1   mupirocin ointment (BACTROBAN) 2 % APPLY A THIN film OF OINTMENT to the open wounds on your toes every other day x 30 days or until healed 22 g 2   nystatin (MYCOSTATIN) 500000 units TABS tablet Take 500,000 Units by mouth daily.     rosuvastatin (CRESTOR) 10 MG tablet Take 1 tablet (10 mg total) by mouth daily. 90 tablet 3   No current facility-administered medications for this visit.    Allergies:   Strawberry extract and Sulfa antibiotics    Social History:  The patient  reports that she has quit smoking. Her smoking use included cigarettes. She has a 10.00 pack-year smoking history. She has never used smokeless tobacco. She reports that she does not drink alcohol and does not use drugs.   Family History:  The patient's family history includes Diabetes in her father and mother; Heart disease in her father and mother; Hypertension in her father and  mother; Stroke in her maternal grandfather.    ROS:  Please see the history of present illness.   Otherwise, review of systems are positive for none.   All other systems are reviewed and negative.    PHYSICAL EXAM: VS:  BP 130/76 (BP Location: Left Arm, Patient Position: Sitting, Cuff Size: Normal)   Resp 18   Ht $R'5\' 4"'nd$  (1.626 m)   Wt 206 lb 9.6 oz (93.7 kg)   LMP 05/16/2021   SpO2 98%   BMI 35.46 kg/m  , BMI Body mass index is 35.46 kg/m.  GEN: Well nourished, well developed, in no acute distress  HEENT: normal  Neck: no JVD, carotid bruits, or masses Cardiac: RRR; no murmurs, rubs, or gallops,no edema  Respiratory:  clear to auscultation bilaterally, normal work of breathing GI: soft, nontender, nondistended, + BS MS: no deformity or atrophy  Skin: warm and dry, no rash Neuro:  Strength and sensation are intact Psych: euthymic mood, full affect Vascular: Femoral pulses normal bilaterally.  Dorsalis pedis is palpable on both sides but posterior tibial is not palpable   EKG:  EKG is ordered today. The ekg ordered today demonstrates atrial sensed ventricular paced rhythm.   Recent Labs: 03/29/2021: ALT 19; BNP 261.8 06/09/2021: BUN 21; Creatinine, Ser 1.10; Hemoglobin 10.0; Platelets 312; Potassium 5.0; Sodium 130    Lipid Panel    Component Value Date/Time   CHOL 186 04/28/2020 1511   CHOL 120 03/17/2017 0000   TRIG 123 04/28/2020 1511   HDL 53 04/28/2020 1511   HDL 54 03/17/2017 0000   CHOLHDL 3.5 04/28/2020 1511   VLDL 25 04/28/2020 1511   LDLCALC 108 (H) 04/28/2020 1511   LDLCALC 30 03/17/2017 0000      Wt Readings from Last 3 Encounters:  06/09/21 206 lb 9.6 oz (93.7 kg)  09/09/20 198 lb (89.8 kg)  07/16/20 189 lb 3.2 oz (85.8 kg)       No flowsheet data found.    ASSESSMENT AND PLAN:  1.  Nonhealing ulceration on bilateral first and second toe with high likelihood of peripheral arterial disease given nonpalpable posterior tibial pulse  bilaterally. This is a limb threatening situation especially with continued tobacco use and uncontrolled diabetes mellitus.  In spite of palpable posterior tibial pulse, I am still very concerned about her pedal arch circulation.  I recommend proceeding with abdominal aortogram with lower extremity runoff and possible endovascular intervention.  I discussed the procedure in details as well as risk and benefits.  We will obtain lower extremity arterial Doppler before the procedure.  Planned access is via the right common femoral artery.  2.  Hyperlipidemia: Given her diabetes and likely peripheral arterial disease, she needs to be on a statin with an LDL below 70.  Most recent lipid profile showed an LDL of 108.  I started rosuvastatin 10 mg daily.  She used to be on this in the past.  3.  Tobacco use: I had a prolonged discussion with her about the importance of smoking cessation in order to prevent lower extremity amputation.  4.  Status post permanent pacemaker placement: Followed by Dr. Curt Bears.  The pacemaker seems to be functioning normally according to her EKG today.   Disposition:   FU with me in 1 month  Signed,  [name], MD  06/10/2021 1:50 PM    [facility]

## 2021-06-09 NOTE — Progress Notes (Signed)
Cardiology Office Note   Date:  06/10/2021   ID:  [name], DOB [birth year], MRN [id number]  PCP:  Sarah Frisk, MD  Cardiologist:   Sarah Bears, MD   No chief complaint on file.     History of Present Illness: Sarah Long is a 50 y.o. female who was referred from the wound center for evaluation of possible peripheral arterial disease given poor healing ulceration on the lower extremities.  She has known history of poorly controlled diabetes, chronic diastolic heart failure, essential hypertension, hyperlipidemia and tobacco use.  She has history of complete heart block status post dual-chamber pacemaker placement done last year by Dr. Elberta Long.  Cardiac catheterization in March 2021 showed normal coronary arteries. She smokes half a pack per day and has been smoking for more than 20 years.  She has been diabetic since she was 50 years old. She with developed blisters and ulceration in the first and second toes of both feet few weeks ago without clear trauma.  She has open wounds on these areas with slow healing.  She also felt some dark discoloration of the tip of the toes and increased swelling.  She has no significant leg pain and she reports some numbness in the bottom of both feet with decreased sensation. No chest pain or worsening dyspnea.  Past Medical History:  Diagnosis Date   Abnormal Pap smear 1998   Abnormal vaginal bleeding 12/19/2011   Bacterial infection    Bipolar 1 disorder (HCC)    Blister of second toe of left foot 11/21/2016   Candida vaginitis 07/2007   Depression    recently added wellbutrin-has not taken yet for bipolar   Diabetes in pregnancy    Diabetes mellitus    nph 20U qam and qpm, regular with meals   Diabetic ketoacidosis without coma associated with type 2 diabetes mellitus (HCC)    Fibroid    Galactorrhea of right breast 2008   H/O amenorrhea 07/2007   H/O dizziness 10/14/11   H/O dysmenorrhea 2010   H/O menorrhagia 10/14/11   H/O  varicella    Headache(784.0)    Heavy vaginal bleeding due to contraceptive injection use 10/12/2011   Depo Provera   Herpes    HSV-2 infection 01/03/2009   Hx: UTI (urinary tract infection) 2009   Hypertension    on aldomet   Increased BMI 2010   Irregular uterine bleeding 04/04/2012   Pt has mirena    Obesity 10/14/11   Oligomenorrhea 07/2007   Pelvic pain in female    Preterm labor    Trichomonas    Yeast infection     Past Surgical History:  Procedure Laterality Date   CESAREAN SECTION  1991   LEFT HEART CATH AND CORONARY ANGIOGRAPHY N/A 02/10/2020   Procedure: LEFT HEART CATH AND CORONARY ANGIOGRAPHY;  Surgeon: Sarah Bihari, MD;  Location: [facility];  Service: Cardiovascular;  Laterality: N/A;   PACEMAKER IMPLANT N/A 02/13/2020   Procedure: PACEMAKER IMPLANT;  Surgeon: Sarah Lemming, MD;  Location: [facility];  Service: Cardiovascular;  Laterality: N/A;   TEMPORARY PACEMAKER N/A 02/10/2020   Procedure: TEMPORARY PACEMAKER;  Surgeon: Sarah Bihari, MD;  Location: [facility] INVASIVE CV [facility];  Service: Cardiovascular;  Laterality: N/A;     Current Outpatient Medications  Medication Sig Dispense Refill   Accu-Chek Softclix Lancets lancets Use as instructed to check blood sugar three times daily. E11.65 100 each 6   acetaminophen (TYLENOL) 325  MG tablet Take 325 mg by mouth every 6 (six) hours as needed.     Blood Glucose Monitoring Suppl (ACCU-CHEK GUIDE ME) w/Device KIT 1 kit by Does not apply route in the morning, at noon, and at bedtime. Use as instructed to check blood sugar three times daily. E11.65 1 kit 0   Blood Glucose Monitoring Suppl (ACCU-CHEK GUIDE) w/Device KIT Use as directed daily 1 kit 0   cephALEXin (KEFLEX) 500 MG capsule Take 500 mg by mouth 2 (two) times daily.     diphenhydrAMINE (BENADRYL) 25 mg capsule Take 25 mg by mouth every 6 (six) hours as needed for allergies.     doxycycline (VIBRAMYCIN) 100 MG capsule Take 1 capsule (100 mg total) by  mouth 2 (two) times daily. 20 capsule 0   glucose blood (ACCU-CHEK GUIDE) test strip Use as instructed to check blood sugar three times daily. E11.65 100 each 6   glucose blood test strip Check your sugar in the morning before you eat breakfast, and one hour after a meal. 100 each 2   hydrOXYzine (ATARAX/VISTARIL) 25 MG tablet Take 1 tablet by mouth at bedtime as needed for sleep 30 tablet 1   insulin aspart (NOVOLOG FLEXPEN) 100 UNIT/ML FlexPen 10 unit with meals Sliding scale three times daily: 0-150: 0 151-200: 2 units  201-250: 4 units  251-300: 6 units  301-350: 8 units  351- 400 10 units   >400  12 units 15 mL 0   Insulin Pen Needle (B-D ULTRAFINE III SHORT PEN) 31G X 8 MM MISC 1 application by Does not apply route daily. 90 each 3   Lancet Devices (ADJUSTABLE LANCING DEVICE) MISC Inject 1 each into the skin 3 (three) times daily. 100 each 5   LATUDA 40 MG TABS tablet SMARTSIG:1 Tablet(s) By Mouth Every Evening     lurasidone (LATUDA) 40 MG TABS tablet Take 1 tablet by mouth every evening with meals to start on or after 04/30/21 30 tablet 1   mupirocin ointment (BACTROBAN) 2 % APPLY A THIN film OF OINTMENT to the open wounds on your toes every other day x 30 days or until healed 22 g 2   nystatin (MYCOSTATIN) 500000 units TABS tablet Take 500,000 Units by mouth daily.     rosuvastatin (CRESTOR) 10 MG tablet Take 1 tablet (10 mg total) by mouth daily. 90 tablet 3   No current facility-administered medications for this visit.    Allergies:   Strawberry extract and Sulfa antibiotics    Social History:  The patient  reports that she has quit smoking. Her smoking use included cigarettes. She has a 10.00 pack-year smoking history. She has never used smokeless tobacco. She reports that she does not drink alcohol and does not use drugs.   Family History:  The patient's family history includes Diabetes in her father and mother; Heart disease in her father and mother; Hypertension in her father and  mother; Stroke in her maternal grandfather.    ROS:  Please see the history of present illness.   Otherwise, review of systems are positive for none.   All other systems are reviewed and negative.    PHYSICAL EXAM: VS:  BP 130/76 (BP Location: Left Arm, Patient Position: Sitting, Cuff Size: Normal)   Resp 18   Ht $R'5\' 4"'sq$  (1.626 m)   Wt 206 lb 9.6 oz (93.7 kg)   LMP 05/16/2021   SpO2 98%   BMI 35.46 kg/m  , BMI Body mass index is 35.46 kg/m.  GEN: Well nourished, well developed, in no acute distress  HEENT: normal  Neck: no JVD, carotid bruits, or masses Cardiac: RRR; no murmurs, rubs, or gallops,no edema  Respiratory:  clear to auscultation bilaterally, normal work of breathing GI: soft, nontender, nondistended, + BS MS: no deformity or atrophy  Skin: warm and dry, no rash Neuro:  Strength and sensation are intact Psych: euthymic mood, full affect Vascular: Femoral pulses normal bilaterally.  Dorsalis pedis is palpable on both sides but posterior tibial is not palpable   EKG:  EKG is ordered today. The ekg ordered today demonstrates atrial sensed ventricular paced rhythm.   Recent Labs: 03/29/2021: ALT 19; BNP 261.8 06/09/2021: BUN 21; Creatinine, Ser 1.10; Hemoglobin 10.0; Platelets 312; Potassium 5.0; Sodium 130    Lipid Panel    Component Value Date/Time   CHOL 186 04/28/2020 1511   CHOL 120 03/17/2017 0000   TRIG 123 04/28/2020 1511   HDL 53 04/28/2020 1511   HDL 54 03/17/2017 0000   CHOLHDL 3.5 04/28/2020 1511   VLDL 25 04/28/2020 1511   LDLCALC 108 (H) 04/28/2020 1511   LDLCALC 30 03/17/2017 0000      Wt Readings from Last 3 Encounters:  06/09/21 206 lb 9.6 oz (93.7 kg)  09/09/20 198 lb (89.8 kg)  07/16/20 189 lb 3.2 oz (85.8 kg)       No flowsheet data found.    ASSESSMENT AND PLAN:  1.  Nonhealing ulceration on bilateral first and second toe with high likelihood of peripheral arterial disease given nonpalpable posterior tibial pulse  bilaterally. This is a limb threatening situation especially with continued tobacco use and uncontrolled diabetes mellitus.  In spite of palpable posterior tibial pulse, I am still very concerned about her pedal arch circulation.  I recommend proceeding with abdominal aortogram with lower extremity runoff and possible endovascular intervention.  I discussed the procedure in details as well as risk and benefits.  We will obtain lower extremity arterial Doppler before the procedure.  Planned access is via the right common femoral artery.  2.  Hyperlipidemia: Given her diabetes and likely peripheral arterial disease, she needs to be on a statin with an LDL below 70.  Most recent lipid profile showed an LDL of 108.  I started rosuvastatin 10 mg daily.  She used to be on this in the past.  3.  Tobacco use: I had a prolonged discussion with her about the importance of smoking cessation in order to prevent lower extremity amputation.  4.  Status post permanent pacemaker placement: Followed by Dr. Curt Long.  The pacemaker seems to be functioning normally according to her EKG today.   Disposition:   FU with me in 1 month  Signed,  [name], MD  06/10/2021 1:50 PM    [facility]

## 2021-06-10 ENCOUNTER — Encounter (HOSPITAL_BASED_OUTPATIENT_CLINIC_OR_DEPARTMENT_OTHER): Payer: Medicaid Other | Attending: Physician Assistant | Admitting: Physician Assistant

## 2021-06-10 ENCOUNTER — Other Ambulatory Visit: Payer: Self-pay

## 2021-06-10 DIAGNOSIS — E11621 Type 2 diabetes mellitus with foot ulcer: Secondary | ICD-10-CM | POA: Diagnosis present

## 2021-06-10 DIAGNOSIS — L97512 Non-pressure chronic ulcer of other part of right foot with fat layer exposed: Secondary | ICD-10-CM | POA: Insufficient documentation

## 2021-06-10 DIAGNOSIS — E1151 Type 2 diabetes mellitus with diabetic peripheral angiopathy without gangrene: Secondary | ICD-10-CM | POA: Insufficient documentation

## 2021-06-10 DIAGNOSIS — E1142 Type 2 diabetes mellitus with diabetic polyneuropathy: Secondary | ICD-10-CM | POA: Diagnosis not present

## 2021-06-10 DIAGNOSIS — L97522 Non-pressure chronic ulcer of other part of left foot with fat layer exposed: Secondary | ICD-10-CM | POA: Diagnosis not present

## 2021-06-10 NOTE — Progress Notes (Addendum)
Sarah Long ([id number]) Visit Report for 06/10/2021 Chief Complaint Document Details Patient Name: Date of Service: Sarah Long 06/10/2021 8:45 A M Medical Record Number: [id number] Patient Account Number: [id number] Date of Birth/Sex: Treating RN: November 04, 1971 (50 y.o. Sarah Long Primary Care Provider: Asencion Noble Other Clinician: Referring Provider: Treating Provider/Extender: Emelda Brothers in Treatment: 2 Information Obtained from: Patient Chief Complaint Bilateral toe ulcers Electronic Signature(s) Signed: 06/10/2021 8:42:51 AM By: [name] PA-C Entered By: [name] on 06/10/2021 08:42:50 -------------------------------------------------------------------------------- HPI Details Patient Name: Date of Service: Sarah Haring R. 06/10/2021 8:45 A M Medical Record Number: [id number] Patient Account Number: [id number] Date of Birth/Sex: Treating RN: July 04, 1971 (50 y.o. Sarah Long Primary Care Provider: Asencion Noble Other Clinician: Referring Provider: Treating Provider/Extender: Emelda Brothers in Treatment: 2 History of Present Illness HPI Description: 05/27/2021 upon evaluation today patient appears to be doing poorly in regard to her toes on the left and right foot. She has the first and second toe on the left foot as well as the fifth toe. On the right foot is just the first and second toe. With that being said she does not know any injury or otherwise that occurred. She states she just looked down and noticed that it was there and again that was just within the last week or so around 13 June. She does not know of anything that happened she has not gone barefoot she tells me that she wear shoes and again has not been outside barefoot at any point in time. Fortunately there does not appear to be any signs of active infection at this time. No fevers, chills, nausea, vomiting, or diarrhea. With that being  said she is on Keflex she was also on doxycycline previous. She does have a strong history of diabetes mellitus type 2 which is fairly uncontrolled she has not had her insulin for quite a while here it seems. I do not have the final A1c yet although her glucose in the hospital last week was 333. Nonetheless I do believe that this coupled with her smoking is going to limit her healing to some degree nonetheless we need to get into trying to get her into a better place with regard to this. Her diabetes again is a big deal as far as healing is concerned right now in my opinion being that it still out of control. She also has a cardiac pacemaker she does not really know exactly what happened she tells me she went into DKA was in the hospital and then subsequently had a pacemaker following. 06/03/2021 upon evaluation today patient appears to be doing well with regard to her wounds on the toes. In general I think that she is doing excellent in my opinion. I do not see any signs of active infection which is great news. No fevers, chills, nausea, vomiting, or diarrhea. Unfortunately the alginate has been sticking to the wound beds. 06/10/2021 upon evaluation today patient appears to be doing well with regard to her wounds currently. Fortunately there does not appear to be any signs of infection. Still without making a tremendous amount of improvement but also things are getting significantly worse either which is also good news. Fortunately there is no evidence of infection at this point. The patient did have her appointment with Dr. Sophronia Simas. He has recommended an arteriogram. She will also be having a vascular ultrasound for blood flow coming up in the next few  days. Electronic Signature(s) Signed: 06/10/2021 9:53:40 AM By: [name] PA-C Entered By: [name] on 06/10/2021 09:53:40 -------------------------------------------------------------------------------- Physical Exam Details Patient Name:  Date of Service: Sarah Long, Sarah EL R. 06/10/2021 8:45 A M Medical Record Number: [id number] Patient Account Number: [id number] Date of Birth/Sex: Treating RN: 1971/04/18 (50 y.o. Sarah Long Primary Care Provider: Asencion Noble Other Clinician: Referring Provider: Treating Provider/Extender: Emelda Brothers in Treatment: 2 Constitutional Well-nourished and well-hydrated in no acute distress. Respiratory normal breathing without difficulty. Psychiatric this patient is able to make decisions and demonstrates good insight into disease process. Alert and Oriented x 3. pleasant and cooperative. Notes Upon inspection patient's wound bed actually showed signs of decent granulation and some areas although she does still have some eschar in other areas. We will trying to help soften some of this up which I think is ideal there is no evidence of infection at this point which is great news and hopefully if we can improve blood flow that would help her significantly. Patient is ABIs actually scheduled for tomorrow. Electronic Signature(s) Signed: 06/10/2021 9:54:08 AM By: [name] PA-C Entered By: [name] on 06/10/2021 09:54:08 -------------------------------------------------------------------------------- Physician Orders Details Patient Name: Date of Service: Sarah Ferrari EL R. 06/10/2021 8:45 A M Medical Record Number: [id number] Patient Account Number: [id number] Date of Birth/Sex: Treating RN: Apr 15, 1971 (50 y.o. 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 - with Margarita Grizzle Return A Bathing/ Shower/ Hygiene May shower and wash wound with soap and water. Edema Control - Lymphedema / SCD / Other Elevate legs to the level of the heart or above for 30 minutes daily and/or when sitting, a frequency of: - throughout the day Avoid standing for long periods of time. Additional Orders / Instructions Stop/Decrease Smoking Wound Treatment Wound #1 - T Great oe Wound Laterality: Left Topical: Mupirocin Ointment Every Other Day/15 Days Discharge Instructions: Apply Mupirocin (Bactroban) thin film to open areas Prim Dressing: Xeroform Occlusive Gauze Dressing, 4x4 in Every Other Day/15 Days ary Discharge Instructions: Apply to wound bed as instructed Secondary Dressing: Woven Gauze Sponges 2x2 in Every Other Day/15 Days Discharge Instructions: Apply over primary dressing as directed. Secured With: Child psychotherapist, Sterile 2x75 (in/in) Every Other Day/15 Days Discharge Instructions: Secure with stretch gauze . wrap toes individually Wound #2 - T Second oe Wound Laterality: Left Topical: Mupirocin Ointment Every Other Day/15 Days Discharge Instructions: Apply Mupirocin (Bactroban) thin film to open areas Prim Dressing: Xeroform Occlusive Gauze Dressing, 4x4 in Every Other Day/15 Days ary Discharge Instructions: Apply to wound bed as instructed Secondary Dressing: Woven Gauze Sponges 2x2 in Every Other Day/15 Days Discharge Instructions: Apply over primary dressing as directed. Secured With: Child psychotherapist, Sterile 2x75 (in/in) Every Other Day/15 Days Discharge Instructions: Secure with stretch gauze . wrap toes individually Wound #3 - T Great oe Wound Laterality: Right Topical: Mupirocin Ointment Every  Other Day/15 Days Discharge Instructions: Apply Mupirocin (Bactroban) thin film to  open areas Prim Dressing: Xeroform Occlusive Gauze Dressing, 4x4 in Every Other Day/15 Days ary Discharge Instructions: Apply to wound bed as instructed Secondary Dressing: Woven Gauze Sponges 2x2 in Every Other Day/15 Days Discharge Instructions: Apply over primary dressing as directed. Secured With: Child psychotherapist, Sterile 2x75 (in/in) Every Other Day/15 Days Discharge Instructions: Secure with stretch gauze . wrap toes individually Wound #4 - T Second oe Wound Laterality: Right Topical: Mupirocin Ointment Every Other Day/15 Days Discharge Instructions: Apply Mupirocin (Bactroban) thin film to open areas Prim Dressing: Xeroform Occlusive Gauze Dressing, 4x4 in Every Other Day/15 Days ary Discharge Instructions: Apply to wound bed as instructed Secondary Dressing: Woven Gauze Sponges 2x2 in Every Other Day/15 Days Discharge Instructions: Apply over primary dressing as directed. Secured With: Child psychotherapist, Sterile 2x75 (in/in) Every Other Day/15 Days Discharge Instructions: Secure with stretch gauze . wrap toes individually Wound #5 - T Fifth oe Wound Laterality: Left Topical: Mupirocin Ointment Every Other Day/15 Days Discharge Instructions: Apply Mupirocin (Bactroban) thin film to open areas Prim Dressing: Xeroform Occlusive Gauze Dressing, 4x4 in Every Other Day/15 Days ary Discharge Instructions: Apply to wound bed as instructed Secondary Dressing: Woven Gauze Sponges 2x2 in Every Other Day/15 Days Discharge Instructions: Apply over primary dressing as directed. Secured With: Child psychotherapist, Sterile 2x75 (in/in) Every Other Day/15 Days Discharge Instructions: Secure with stretch gauze . wrap toes individually Electronic Signature(s) Signed: 06/10/2021 4:43:01 PM By: [name] PA-C Signed: 06/10/2021 6:17:15 PM By: Baruch Gouty RN,  BSN Entered By: Baruch Gouty on 06/10/2021 09:50:01 -------------------------------------------------------------------------------- Problem List Details Patient Name: Date of Service: Sarah Haring R. 06/10/2021 8:45 A M Medical Record Number: [id number] Patient Account Number: [id number] Date of Birth/Sex: Treating RN: [birth year] (50 y.o. Sarah Long Primary Care Provider: Asencion Noble Other Clinician: Referring Provider: Treating Provider/Extender: Emelda Brothers in Treatment: 2 Active Problems ICD-10 Encounter Code Description Active Date MDM Diagnosis E11.621 Type 2 diabetes mellitus with foot ulcer 05/27/2021 No Yes L97.522 Non-pressure chronic ulcer of other part of left foot with fat layer exposed 05/27/2021 No Yes L97.512 Non-pressure chronic ulcer of other part of right foot with fat layer exposed 05/27/2021 No Yes E11.43 Type 2 diabetes mellitus with diabetic autonomic (poly)neuropathy 05/27/2021 No Yes I10 Essential (primary) hypertension 05/27/2021 No Yes Z95.0 Presence of cardiac pacemaker 05/27/2021 No Yes Inactive Problems Resolved Problems Electronic Signature(s) Signed: 06/10/2021 8:42:42 AM By: [name] PA-C Entered By: [name] on 06/10/2021 08:42:42 -------------------------------------------------------------------------------- Progress Note Details Patient Name: Date of Service: Sarah Haring R. 06/10/2021 8:45 A M Medical Record Number: [id number] Patient Account Number: [id number] Date of Birth/Sex: Treating RN: 1971-04-03 (50 y.o. Sarah Long Primary Care Provider: Asencion Noble Other Clinician: Referring Provider: Treating Provider/Extender: Emelda Brothers in Treatment: 2 Subjective Chief Complaint Information obtained from Patient Bilateral toe ulcers History of Present Illness (HPI) 05/27/2021 upon evaluation today patient appears to be doing poorly in regard to her toes  on the left and right foot. She has the first and second toe on the left foot as well as the fifth toe. On the right foot is just the first and second toe. With that being said she does not know any injury or otherwise that occurred. She states she just looked down and noticed that it was there and again that was just within the last week or so around 13 June. She does not know of anything that  happened she has not gone barefoot she tells me that she wear shoes and again has not been outside barefoot at any point in time. Fortunately there does not appear to be any signs of active infection at this time. No fevers, chills, nausea, vomiting, or diarrhea. With that being said she is on Keflex she was also on doxycycline previous. She does have a strong history of diabetes mellitus type 2 which is fairly uncontrolled she has not had her insulin for quite a while here it seems. I do not have the final A1c yet although her glucose in the hospital last week was 333. Nonetheless I do believe that this coupled with her smoking is going to limit her healing to some degree nonetheless we need to get into trying to get her into a better place with regard to this. Her diabetes again is a big deal as far as healing is concerned right now in my opinion being that it still out of control. She also has a cardiac pacemaker she does not really know exactly what happened she tells me she went into DKA was in the hospital and then subsequently had a pacemaker following. 06/03/2021 upon evaluation today patient appears to be doing well with regard to her wounds on the toes. In general I think that she is doing excellent in my opinion. I do not see any signs of active infection which is great news. No fevers, chills, nausea, vomiting, or diarrhea. Unfortunately the alginate has been sticking to the wound beds. 06/10/2021 upon evaluation today patient appears to be doing well with regard to her wounds currently. Fortunately  there does not appear to be any signs of infection. Still without making a tremendous amount of improvement but also things are getting significantly worse either which is also good news. Fortunately there is no evidence of infection at this point. The patient did have her appointment with Dr. Sophronia Simas. He has recommended an arteriogram. She will also be having a vascular ultrasound for blood flow coming up in the next few days. Objective Constitutional Well-nourished and well-hydrated in no acute distress. Vitals Time Taken: 8:43 AM, Height: 64 in, Weight: 200 lbs, BMI: 34.3, Temperature: 97.9 F, Pulse: 87 bpm, Respiratory Rate: 18 breaths/min, Blood Pressure: 143/87 mmHg, Pulse Oximetry: 96 %. Respiratory normal breathing without difficulty. Psychiatric this patient is able to make decisions and demonstrates good insight into disease process. Alert and Oriented x 3. pleasant and cooperative. General Notes: Upon inspection patient's wound bed actually showed signs of decent granulation and some areas although she does still have some eschar in other areas. We will trying to help soften some of this up which I think is ideal there is no evidence of infection at this point which is great news and hopefully if we can improve blood flow that would help her significantly. Patient is ABIs actually scheduled for tomorrow. Integumentary (Hair, Skin) Wound #1 status is Open. Original cause of wound was Other Lesion. The date acquired was: 05/18/2021. The wound has been in treatment 2 weeks. The wound is located on the Left T Great. The wound measures 2.6cm length x 2.9cm width x 0.1cm depth; 5.922cm^2 area and 0.592cm^3 volume. There is Fat Layer oe (Subcutaneous Tissue) exposed. There is no tunneling or undermining noted. There is a medium amount of serosanguineous drainage noted. The wound margin is distinct with the outline attached to the wound base. There is medium (34-66%) red granulation within the  wound bed. There is a medium (34-66%)  amount of necrotic tissue within the wound bed including Adherent Slough. Wound #2 status is Open. Original cause of wound was Other Lesion. The date acquired was: 05/18/2021. The wound has been in treatment 2 weeks. The wound is located on the Left T Second. The wound measures 2.3cm length x 2.1cm width x 0.1cm depth; 3.793cm^2 area and 0.379cm^3 volume. There is Fat Layer oe (Subcutaneous Tissue) exposed. There is no tunneling or undermining noted. There is a medium amount of serosanguineous drainage noted. The wound margin is distinct with the outline attached to the wound base. There is medium (34-66%) red granulation within the wound bed. There is a medium (34-66%) amount of necrotic tissue within the wound bed including Adherent Slough. Wound #3 status is Open. Original cause of wound was Other Lesion. The date acquired was: 05/18/2021. The wound has been in treatment 2 weeks. The wound is located on the Right T Great. The wound measures 3.4cm length x 2.1cm width x 0.1cm depth; 5.608cm^2 area and 0.561cm^3 volume. There is Fat Layer oe (Subcutaneous Tissue) exposed. There is no tunneling or undermining noted. There is a medium amount of serosanguineous drainage noted. The wound margin is distinct with the outline attached to the wound base. There is medium (34-66%) red granulation within the wound bed. There is a medium (34-66%) amount of necrotic tissue within the wound bed including Adherent Slough. Wound #4 status is Open. Original cause of wound was Other Lesion. The date acquired was: 05/18/2021. The wound has been in treatment 2 weeks. The wound is located on the Right T Second. The wound measures 1.8cm length x 1.5cm width x 0.1cm depth; 2.121cm^2 area and 0.212cm^3 volume. There is Fat oe Layer (Subcutaneous Tissue) exposed. There is no tunneling or undermining noted. There is a medium amount of serosanguineous drainage noted. The wound margin is  distinct with the outline attached to the wound base. There is medium (34-66%) red granulation within the wound bed. There is a medium (34-66%) amount of necrotic tissue within the wound bed including Adherent Slough. Wound #5 status is Open. Original cause of wound was Blister. The date acquired was: 05/18/2021. The wound has been in treatment 2 weeks. The wound is located on the Left T Fifth. The wound measures 0.9cm length x 0.6cm width x 0.1cm depth; 0.424cm^2 area and 0.042cm^3 volume. There is Fat Layer oe (Subcutaneous Tissue) exposed. There is no tunneling or undermining noted. There is a medium amount of serous drainage noted. The wound margin is distinct with the outline attached to the wound base. There is medium (34-66%) pink granulation within the wound bed. There is a medium (34-66%) amount of necrotic tissue within the wound bed including Adherent Slough. Assessment Active Problems ICD-10 Type 2 diabetes mellitus with foot ulcer Non-pressure chronic ulcer of other part of left foot with fat layer exposed Non-pressure chronic ulcer of other part of right foot with fat layer exposed Type 2 diabetes mellitus with diabetic autonomic (poly)neuropathy Essential (primary) hypertension Presence of cardiac pacemaker Plan Follow-up Appointments: Return Appointment in 2 weeks. - with Glynn Octave Shower/ Hygiene: May shower and wash wound with soap and water. Edema Control - Lymphedema / SCD / Other: Elevate legs to the level of the heart or above for 30 minutes daily and/or when sitting, a frequency of: - throughout the day Avoid standing for long periods of time. Additional Orders / Instructions: Stop/Decrease Smoking WOUND #1: - T Great Wound Laterality: Left oe Topical: Mupirocin Ointment Every Other Day/15 Days Discharge  Instructions: Apply Mupirocin (Bactroban) thin film to open areas Prim Dressing: Xeroform Occlusive Gauze Dressing, 4x4 in Every Other Day/15  Days ary Discharge Instructions: Apply to wound bed as instructed Secondary Dressing: Woven Gauze Sponges 2x2 in Every Other Day/15 Days Discharge Instructions: Apply over primary dressing as directed. Secured With: Child psychotherapist, Sterile 2x75 (in/in) Every Other Day/15 Days Discharge Instructions: Secure with stretch gauze . wrap toes individually WOUND #2: - T Second Wound Laterality: Left oe Topical: Mupirocin Ointment Every Other Day/15 Days Discharge Instructions: Apply Mupirocin (Bactroban) thin film to open areas Prim Dressing: Xeroform Occlusive Gauze Dressing, 4x4 in Every Other Day/15 Days ary Discharge Instructions: Apply to wound bed as instructed Secondary Dressing: Woven Gauze Sponges 2x2 in Every Other Day/15 Days Discharge Instructions: Apply over primary dressing as directed. Secured With: Child psychotherapist, Sterile 2x75 (in/in) Every Other Day/15 Days Discharge Instructions: Secure with stretch gauze . wrap toes individually WOUND #3: - T Great Wound Laterality: Right oe Topical: Mupirocin Ointment Every Other Day/15 Days Discharge Instructions: Apply Mupirocin (Bactroban) thin film to open areas Prim Dressing: Xeroform Occlusive Gauze Dressing, 4x4 in Every Other Day/15 Days ary Discharge Instructions: Apply to wound bed as instructed Secondary Dressing: Woven Gauze Sponges 2x2 in Every Other Day/15 Days Discharge Instructions: Apply over primary dressing as directed. Secured With: Child psychotherapist, Sterile 2x75 (in/in) Every Other Day/15 Days Discharge Instructions: Secure with stretch gauze . wrap toes individually WOUND #4: - T Second Wound Laterality: Right oe Topical: Mupirocin Ointment Every Other Day/15 Days Discharge Instructions: Apply Mupirocin (Bactroban) thin film to open areas Prim Dressing: Xeroform Occlusive Gauze Dressing, 4x4 in Every Other Day/15 Days ary Discharge Instructions: Apply to wound  bed as instructed Secondary Dressing: Woven Gauze Sponges 2x2 in Every Other Day/15 Days Discharge Instructions: Apply over primary dressing as directed. Secured With: Child psychotherapist, Sterile 2x75 (in/in) Every Other Day/15 Days Discharge Instructions: Secure with stretch gauze . wrap toes individually WOUND #5: - T Fifth Wound Laterality: Left oe Topical: Mupirocin Ointment Every Other Day/15 Days Discharge Instructions: Apply Mupirocin (Bactroban) thin film to open areas Prim Dressing: Xeroform Occlusive Gauze Dressing, 4x4 in Every Other Day/15 Days ary Discharge Instructions: Apply to wound bed as instructed Secondary Dressing: Woven Gauze Sponges 2x2 in Every Other Day/15 Days Discharge Instructions: Apply over primary dressing as directed. Secured With: Child psychotherapist, Sterile 2x75 (in/in) Every Other Day/15 Days Discharge Instructions: Secure with stretch gauze . wrap toes individually 1. Would recommend currently that we going continue with the wound care measures as before and the patient is in agreement with plan this is including the mupirocin followed by the Xeroform gauze. 2. Also can recommend that we have the patient continue to monitor for any signs of worsening or infection if anything occurs she should let me know soon as possible. 3. I am also going to recommend that the patient continue to follow-up with Dr. Sophronia Simas regularly I think this is of utmost importance. 4. I did have a conversation with the patient as well about smoking cessation she is going to discuss this further on the 14th with her primary care provider. We will see patient back for reevaluation in 1 week here in the clinic. If anything worsens or changes patient will contact our office for additional recommendations. Electronic Signature(s) Signed: 06/10/2021 9:55:53 AM By: [name] PA-C Entered By: [name] on 06/10/2021  09:55:53 -------------------------------------------------------------------------------- SuperBill Details Patient Name: Date of  Service: Sarah Long, Sarah Long 06/10/2021 Medical Record Number: [id number] Patient Account Number: [id number] Date of Birth/Sex: Treating RN: 1971-05-22 (50 y.o. [name], [name] Primary Care Provider: Asencion Noble Other Clinician: Referring Provider: Treating Provider/Extender: Emelda Brothers in Treatment: 2 Diagnosis Coding ICD-10 Codes Code Description [phone] Type 2 diabetes mellitus with foot ulcer L97.522 Non-pressure chronic ulcer of other part of left foot with fat layer exposed L97.512 Non-pressure chronic ulcer of other part of right foot with fat layer exposed E11.43 Type 2 diabetes mellitus with diabetic autonomic (poly)neuropathy I10 Essential (primary) hypertension Z95.0 Presence of cardiac pacemaker Facility Procedures CPT4 Code: [id number] Description: [phone] - WOUND CARE VISIT-LEV 5 EST PT Modifier: Quantity: 1 Physician Procedures : CPT4 Code Description Modifier [id number] - WC PHYS LEVEL 4 - EST PT ICD-10 Diagnosis Description E11.621 Type 2 diabetes mellitus with foot ulcer L97.522 Non-pressure chronic ulcer of other part of left foot with fat layer exposed L97.512  Non-pressure chronic ulcer of other part of right foot with fat layer exposed E11.43 Type 2 diabetes mellitus with diabetic autonomic (poly)neuropathy Quantity: 1 Electronic Signature(s) Signed: 06/10/2021 9:56:09 AM By: [name] PA-C Entered By: [name] on 06/10/2021 09:56:08

## 2021-06-10 NOTE — Progress Notes (Signed)
BEULA, JOYNER ([id number]) Visit Report for 06/10/2021 Arrival Information Details Patient Name: Date of Service: Sarah Long, Sarah Long 06/10/2021 8:45 A M Medical Record Number: [id number] Patient Account Number: [id number] Date of Birth/Sex: Treating RN: 09-14-1971 (50 y.o. Nita Sells Primary Care Kelijah Towry: Asencion Noble Other Clinician: Referring Letonia Stead: Treating Alaine Loughney/Extender: Emelda Brothers in Treatment: 2 Visit Information History Since Last Visit All ordered tests and consults were completed: No Patient Arrived: Ambulatory Added or deleted any medications: No Arrival Time: 08:40 Any new allergies or adverse reactions: No Accompanied By: alone Had a fall or experienced change in No Transfer Assistance: None activities of daily living that may affect Patient Identification Verified: Yes risk of falls: Secondary Verification Process Completed: Yes Signs or symptoms of abuse/neglect since last visito No Patient Requires Transmission-Based Precautions: No Hospitalized since last visit: No Patient Has Alerts: Yes Implantable device outside of the clinic excluding No Patient Alerts: Pacemaker cellular tissue based products placed in the center Bilat ABI=Powell since last visit: Has Dressing in Place as Prescribed: Yes Pain Present Now: No Electronic Signature(s) Signed: 06/10/2021 5:07:06 PM By: Leane Call Entered By: Leane Call on 06/10/2021 08:44:58 -------------------------------------------------------------------------------- Clinic Level of Care Assessment Details Patient Name: Date of Service: Sarah Long 06/10/2021 8:45 A M Medical Record Number: [id number] Patient Account Number: [id number] Date of Birth/Sex: Treating RN: Jul 01, 1971 (50 y.o. [name] Primary Care Faruq Rosenberger: Asencion Noble Other Clinician: Referring Rydan Gulyas: Treating Pascal Stiggers/Extender: Emelda Brothers in Treatment: 2 Clinic  Level of Care Assessment Items TOOL 4 Quantity Score []  - 0 Use when only an EandM is performed on FOLLOW-UP visit ASSESSMENTS - Nursing Assessment / Reassessment X- 1 10 Reassessment of Co-morbidities (includes updates in patient status) X- 1 5 Reassessment of Adherence to Treatment Plan ASSESSMENTS - Wound and Skin A ssessment / Reassessment []  - 0 Simple Wound Assessment / Reassessment - one wound X- 4 5 Complex Wound Assessment / Reassessment - multiple wounds []  - 0 Dermatologic / Skin Assessment (not related to wound area) ASSESSMENTS - Focused Assessment X- 2 5 Circumferential Edema Measurements - multi extremities []  - 0 Nutritional Assessment / Counseling / Intervention X- 1 5 Lower Extremity Assessment (monofilament, tuning fork, pulses) []  - 0 Peripheral Arterial Disease Assessment (using hand held doppler) ASSESSMENTS - Ostomy and/or Continence Assessment and Care []  - 0 Incontinence Assessment and Management []  - 0 Ostomy Care Assessment and Management (repouching, etc.) PROCESS - Coordination of Care X - Simple Patient / Family Education for ongoing care 1 15 []  - 0 Complex (extensive) Patient / Family Education for ongoing care X- 1 10 Staff obtains Programmer, systems, Records, T Results / Process Orders est []  - 0 Staff telephones HHA, Nursing Homes / Clarify orders / etc []  - 0 Routine Transfer to another Facility (non-emergent condition) []  - 0 Routine Hospital Admission (non-emergent condition) []  - 0 New Admissions / Biomedical engineer / Ordering NPWT Apligraf, etc. , []  - 0 Emergency Hospital Admission (emergent condition) X- 1 10 Simple Discharge Coordination []  - 0 Complex (extensive) Discharge Coordination PROCESS - Special Needs []  - 0 Pediatric / Minor Patient Management []  - 0 Isolation Patient Management []  - 0 Hearing / Language / Visual special needs []  - 0 Assessment of Community assistance (transportation, D/C planning, etc.) []   - 0 Additional assistance / Altered mentation []  - 0 Support Surface(s) Assessment (bed, cushion, seat, etc.) INTERVENTIONS - Wound Cleansing / Measurement []  - 0 Simple Wound  Cleansing - one wound X- 4 5 Complex Wound Cleansing - multiple wounds X- 1 5 Wound Imaging (photographs - any number of wounds) []  - 0 Wound Tracing (instead of photographs) []  - 0 Simple Wound Measurement - one wound X- 4 5 Complex Wound Measurement - multiple wounds INTERVENTIONS - Wound Dressings X - Small Wound Dressing one or multiple wounds 4 10 []  - 0 Medium Wound Dressing one or multiple wounds []  - 0 Large Wound Dressing one or multiple wounds X- 1 5 Application of Medications - topical []  - 0 Application of Medications - injection INTERVENTIONS - Miscellaneous []  - 0 External ear exam []  - 0 Specimen Collection (cultures, biopsies, blood, body fluids, etc.) []  - 0 Specimen(s) / Culture(s) sent or taken to Lab for analysis []  - 0 Patient Transfer (multiple staff / Civil Service fast streamer / Similar devices) []  - 0 Simple Staple / Suture removal (25 or less) []  - 0 Complex Staple / Suture removal (26 or more) []  - 0 Hypo / Hyperglycemic Management (close monitor of Blood Glucose) []  - 0 Ankle / Brachial Index (ABI) - do not check if billed separately X- 1 5 Vital Signs Has the patient been seen at the hospital within the last three years: Yes Total Score: 180 Level Of Care: New/Established - Level 5 Electronic Signature(s) Signed: 06/10/2021 6:17:15 PM By: Baruch Gouty RN, BSN Entered By: Baruch Gouty on 06/10/2021 09:48:19 -------------------------------------------------------------------------------- Encounter Discharge Information Details Patient Name: Date of Service: Sarah Haring R. 06/10/2021 8:45 A M Medical Record Number: [id number] Patient Account Number: [id number] Date of Birth/Sex: Treating RN: 01/28/1971 (50 y.o. Nita Sells Primary Care Anchor Dwan: Asencion Noble  Other Clinician: Referring Electra Paladino: Treating Gracynn Rajewski/Extender: Emelda Brothers in Treatment: 2 Encounter Discharge Information Items Discharge Condition: Stable Ambulatory Status: Ambulatory Discharge Destination: Home Transportation: Private Auto Accompanied By: alone Schedule Follow-up Appointment: No Clinical Summary of Care: Patient Declined Electronic Signature(s) Signed: 06/10/2021 5:07:06 PM By: Leane Call Entered By: Leane Call on 06/10/2021 17:01:49 -------------------------------------------------------------------------------- Lower Extremity Assessment Details Patient Name: Date of Service: YAZLIN, EKBLAD 06/10/2021 8:45 A M Medical Record Number: [id number] Patient Account Number: [id number] Date of Birth/Sex: Treating RN: [birth year] (50 y.o. Nita Sells Primary Care Tameia Rafferty: Asencion Noble Other Clinician: Referring Ewald Beg: Treating Shynia Daleo/Extender: Emelda Brothers in Treatment: 2 Edema Assessment Assessed: Shirlyn Goltz: No] Patrice Paradise: No] Edema: [Left: No] [Right: No] Calf Left: Right: Point of Measurement: 30 cm From Medial Instep 37.5 cm 38 cm Ankle Left: Right: Point of Measurement: 9 cm From Medial Instep 24.2 cm 24.4 cm Vascular Assessment Pulses: Dorsalis Pedis Palpable: [Left:Yes] [Right:Yes] Electronic Signature(s) Signed: 06/10/2021 5:07:06 PM By: Leane Call Entered By: Leane Call on 06/10/2021 08:48:17 -------------------------------------------------------------------------------- Multi-Disciplinary Care Plan Details Patient Name: Date of Service: Sarah Haring R. 06/10/2021 8:45 A M Medical Record Number: [id number] Patient Account Number: [id number] Date of Birth/Sex: Treating RN: 31-Dec-1970 (50 y.o. [name] Primary Care Leelynn Whetsel: Asencion Noble Other Clinician: Referring Sherion Dooly: Treating Lottie Sigman/Extender: Emelda Brothers in  Treatment: 2 Multidisciplinary Care Plan reviewed with physician Active Inactive Nutrition Nursing Diagnoses: Impaired glucose control: actual or potential Potential for alteratiion in Nutrition/Potential for imbalanced nutrition Goals: Patient/caregiver verbalizes understanding of need to maintain therapeutic glucose control per primary care physician Date Initiated: 05/27/2021 Target Resolution Date: 06/24/2021 Goal Status: Active Patient/caregiver will maintain therapeutic glucose control Date Initiated: 05/27/2021 Target Resolution Date: 06/24/2021 Goal Status: Active Interventions: Assess patient nutrition upon admission and  as needed per policy Provide education on nutrition Treatment Activities: Patient referred to Primary Care Physician for further nutritional evaluation : 05/27/2021 Notes: Tissue Oxygenation Nursing Diagnoses: Actual ineffective tissue perfusion; peripheral (select once diagnosis is confirmed) Knowledge deficit related to disease process and management Goals: Invasive arterial studies completed as ordered Date Initiated: 06/10/2021 Target Resolution Date: 07/08/2021 Goal Status: Active Non-invasive arterial studies are completed as ordered Date Initiated: 06/10/2021 Target Resolution Date: 07/08/2021 Goal Status: Active Patient/caregiver will verbalize understanding of disease process and disease management Date Initiated: 06/10/2021 Target Resolution Date: 07/08/2021 Goal Status: Active Interventions: Assess patient understanding of disease process and management upon diagnosis and as needed Assess peripheral arterial status upon admission and as needed Treatment Activities: Invasive vascular studies : 06/10/2021 Non-invasive vascular studies : 06/10/2021 T ordered outside of clinic : 06/10/2021 est Notes: ABIs scheduled 7/7, arteriogram scheduled 06/17/21 Wound/Skin Impairment Nursing Diagnoses: Impaired tissue integrity Knowledge deficit related to smoking  impact on wound healing Knowledge deficit related to ulceration/compromised skin integrity Goals: Patient will demonstrate a reduced rate of smoking or cessation of smoking Date Initiated: 05/27/2021 Target Resolution Date: 06/24/2021 Goal Status: Active Patient/caregiver will verbalize understanding of skin care regimen Date Initiated: 05/27/2021 Target Resolution Date: 06/24/2021 Goal Status: Active Ulcer/skin breakdown will have a volume reduction of 30% by week 4 Date Initiated: 05/27/2021 Target Resolution Date: 06/24/2021 Goal Status: Active Interventions: Assess patient/caregiver ability to obtain necessary supplies Assess patient/caregiver ability to perform ulcer/skin care regimen upon admission and as needed Assess ulceration(s) every visit Provide education on smoking Provide education on ulcer and skin care Treatment Activities: Skin care regimen initiated : 05/27/2021 Topical wound management initiated : 05/27/2021 Notes: Electronic Signature(s) Signed: 06/10/2021 6:17:15 PM By: Baruch Gouty RN, BSN Entered By: Baruch Gouty on 06/10/2021 09:47:11 -------------------------------------------------------------------------------- Pain Assessment Details Patient Name: Date of Service: Sarah Haring R. 06/10/2021 8:45 A M Medical Record Number: [id number] Patient Account Number: [id number] Date of Birth/Sex: Treating RN: Mar 31, 1971 (50 y.o. Nita Sells Primary Care Shauntel Prest: Asencion Noble Other Clinician: Referring Dion Parrow: Treating Kenda Kloehn/Extender: Emelda Brothers in Treatment: 2 Active Problems Location of Pain Severity and Description of Pain Patient Has Paino No Site Locations Rate the pain. Rate the pain. Current Pain Level: 0 Pain Management and Medication Current Pain Management: Electronic Signature(s) Signed: 06/10/2021 5:07:06 PM By: Leane Call Entered By: Leane Call on 06/10/2021  08:46:02 -------------------------------------------------------------------------------- Patient/Caregiver Education Details Patient Name: Date of Service: Orland Mustard 7/6/2022andnbsp8:45 A M Medical Record Number: [id number] Patient Account Number: [id number] Date of Birth/Gender: Treating RN: 06-15-1971 (50 y.o. [name] Primary Care Physician: Asencion Noble Other Clinician: Referring Physician: Treating Physician/Extender: Emelda Brothers in Treatment: 2 Education Assessment Education Provided To: Patient Education Topics Provided Elevated Blood Sugar/ Impact on Healing: Methods: Explain/Verbal Responses: Reinforcements needed, State content correctly Smoking and Wound Healing: Methods: Explain/Verbal Responses: Reinforcements needed, State content correctly Wound/Skin Impairment: Methods: Explain/Verbal Responses: Reinforcements needed, State content correctly Electronic Signature(s) Signed: 06/10/2021 6:17:15 PM By: Baruch Gouty RN, BSN Entered By: Baruch Gouty on 06/10/2021 09:45:37 -------------------------------------------------------------------------------- Wound Assessment Details Patient Name: Date of Service: Sarah Haring R. 06/10/2021 8:45 A M Medical Record Number: [id number] Patient Account Number: [id number] Date of Birth/Sex: Treating RN: 1971/09/24 (50 y.o. Nita Sells Primary Care Jasan Doughtie: Asencion Noble Other Clinician: Referring Raed Schalk: Treating Kealohilani Maiorino/Extender: Emelda Brothers in Treatment: 2 Wound Status Wound Number: 1 Primary Etiology: Diabetic Wound/Ulcer of the Lower Extremity Wound Location:  Left T Great oe Wound Status: Open Wounding Event: Other Lesion Comorbid History: Glaucoma, Hypertension, Type II Diabetes, Neuropathy Date Acquired: 05/18/2021 Weeks Of Treatment: 2 Clustered Wound: No Photos Photo Uploaded By: Sandre Kitty on 06/10/2021  16:48:28 Wound Measurements Length: (cm) 2.6 Width: (cm) 2.9 Depth: (cm) 0.1 Area: (cm) 5.922 Volume: (cm) 0.592 % Reduction in Area: 66.5% % Reduction in Volume: 83.2% Epithelialization: None Tunneling: No Undermining: No Wound Description Classification: Grade 1 Wound Margin: Distinct, outline attached Exudate Amount: Medium Exudate Type: Serosanguineous Exudate Color: red, brown Foul Odor After Cleansing: No Slough/Fibrino Yes Wound Bed Granulation Amount: Medium (34-66%) Exposed Structure Granulation Quality: Red Fascia Exposed: No Necrotic Amount: Medium (34-66%) Fat Layer (Subcutaneous Tissue) Exposed: Yes Necrotic Quality: Adherent Slough Tendon Exposed: No Muscle Exposed: No Joint Exposed: No Bone Exposed: No Treatment Notes Wound #1 (Toe Great) Wound Laterality: Left Cleanser Peri-Wound Care Topical Mupirocin Ointment Discharge Instruction: Apply Mupirocin (Bactroban) thin film to open areas Primary Dressing Xeroform Occlusive Gauze Dressing, 4x4 in Discharge Instruction: Apply to wound bed as instructed Secondary Dressing Woven Gauze Sponges 2x2 in Discharge Instruction: Apply over primary dressing as directed. Secured With Conforming Stretch Gauze Bandage, Sterile 2x75 (in/in) Discharge Instruction: Secure with stretch gauze . wrap toes individually Compression Wrap Compression Stockings Add-Ons Electronic Signature(s) Signed: 06/10/2021 5:07:06 PM By: Leane Call Entered By: Leane Call on 06/10/2021 08:57:58 -------------------------------------------------------------------------------- Wound Assessment Details Patient Name: Date of Service: Sarah Haring R. 06/10/2021 8:45 A M Medical Record Number: [id number] Patient Account Number: [id number] Date of Birth/Sex: Treating RN: Jan 25, 1971 (50 y.o. Nita Sells Primary Care Kaytelynn Scripter: Asencion Noble Other Clinician: Referring Recie Cirrincione: Treating Yves Fodor/Extender: Emelda Brothers in Treatment: 2 Wound Status Wound Number: 2 Primary Etiology: Diabetic Wound/Ulcer of the Lower Extremity Wound Location: Left T Second oe Wound Status: Open Wounding Event: Other Lesion Comorbid History: Glaucoma, Hypertension, Type II Diabetes, Neuropathy Date Acquired: 05/18/2021 Weeks Of Treatment: 2 Clustered Wound: No Photos Photo Uploaded By: Sandre Kitty on 06/10/2021 16:48:29 Wound Measurements Length: (cm) 2.3 Width: (cm) 2.1 Depth: (cm) 0.1 Area: (cm) 3.793 Volume: (cm) 0.379 % Reduction in Area: -57.8% % Reduction in Volume: 21.2% Epithelialization: None Tunneling: No Undermining: No Wound Description Classification: Grade 1 Wound Margin: Distinct, outline attached Exudate Amount: Medium Exudate Type: Serosanguineous Exudate Color: red, brown Wound Bed Granulation Amount: Medium (34-66%) Granulation Quality: Red Necrotic Amount: Medium (34-66%) Necrotic Quality: Adherent Slough Foul Odor After Cleansing: No Slough/Fibrino Yes Exposed Structure Fascia Exposed: No Fat Layer (Subcutaneous Tissue) Exposed: Yes Tendon Exposed: No Muscle Exposed: No Joint Exposed: No Bone Exposed: No Treatment Notes Wound #2 (Toe Second) Wound Laterality: Left Cleanser Peri-Wound Care Topical Mupirocin Ointment Discharge Instruction: Apply Mupirocin (Bactroban) thin film to open areas Primary Dressing Xeroform Occlusive Gauze Dressing, 4x4 in Discharge Instruction: Apply to wound bed as instructed Secondary Dressing Woven Gauze Sponges 2x2 in Discharge Instruction: Apply over primary dressing as directed. Secured With Conforming Stretch Gauze Bandage, Sterile 2x75 (in/in) Discharge Instruction: Secure with stretch gauze . wrap toes individually Compression Wrap Compression Stockings Add-Ons Electronic Signature(s) Signed: 06/10/2021 5:07:06 PM By: Leane Call Entered By: Leane Call on 06/10/2021  08:58:23 -------------------------------------------------------------------------------- Wound Assessment Details Patient Name: Date of Service: Sarah Haring R. 06/10/2021 8:45 A M Medical Record Number: [id number] Patient Account Number: [id number] Date of Birth/Sex: Treating RN: 1971/09/12 (50 y.o. Nita Sells Primary Care Karole Oo: Asencion Noble Other Clinician: Referring Allisha Harter: Treating Bryann Gentz/Extender: Emelda Brothers in Treatment: 2 Wound Status  Wound Number: 3 Primary Etiology: Diabetic Wound/Ulcer of the Lower Extremity Wound Location: Right T Great oe Wound Status: Open Wounding Event: Other Lesion Comorbid History: Glaucoma, Hypertension, Type II Diabetes, Neuropathy Date Acquired: 05/18/2021 Weeks Of Treatment: 2 Clustered Wound: No Photos Photo Uploaded By: Sandre Kitty on 06/10/2021 16:49:10 Wound Measurements Length: (cm) 3.4 Width: (cm) 2.1 Depth: (cm) 0.1 Area: (cm) 5.608 Volume: (cm) 0.561 % Reduction in Area: 67.5% % Reduction in Volume: 83.8% Epithelialization: Small (1-33%) Tunneling: No Undermining: No Wound Description Classification: Grade 1 Wound Margin: Distinct, outline attached Exudate Amount: Medium Exudate Type: Serosanguineous Exudate Color: red, brown Foul Odor After Cleansing: No Slough/Fibrino Yes Wound Bed Granulation Amount: Medium (34-66%) Exposed Structure Granulation Quality: Red Fascia Exposed: No Necrotic Amount: Medium (34-66%) Fat Layer (Subcutaneous Tissue) Exposed: Yes Necrotic Quality: Adherent Slough Tendon Exposed: No Muscle Exposed: No Joint Exposed: No Bone Exposed: No Treatment Notes Wound #3 (Toe Great) Wound Laterality: Right Cleanser Peri-Wound Care Topical Mupirocin Ointment Discharge Instruction: Apply Mupirocin (Bactroban) thin film to open areas Primary Dressing Xeroform Occlusive Gauze Dressing, 4x4 in Discharge Instruction: Apply to wound bed as  instructed Secondary Dressing Woven Gauze Sponges 2x2 in Discharge Instruction: Apply over primary dressing as directed. Secured With Conforming Stretch Gauze Bandage, Sterile 2x75 (in/in) Discharge Instruction: Secure with stretch gauze . wrap toes individually Compression Wrap Compression Stockings Add-Ons Electronic Signature(s) Signed: 06/10/2021 5:07:06 PM By: Leane Call Entered By: Leane Call on 06/10/2021 08:58:54 -------------------------------------------------------------------------------- Wound Assessment Details Patient Name: Date of Service: Sarah Haring R. 06/10/2021 8:45 A M Medical Record Number: [id number] Patient Account Number: [id number] Date of Birth/Sex: Treating RN: [birth year] (50 y.o. Nita Sells Primary Care Thomes Burak: Asencion Noble Other Clinician: Referring Isaiahs Chancy: Treating Aimee Heldman/Extender: Emelda Brothers in Treatment: 2 Wound Status Wound Number: 4 Primary Etiology: Diabetic Wound/Ulcer of the Lower Extremity Wound Location: Right T Second oe Wound Status: Open Wounding Event: Other Lesion Comorbid History: Glaucoma, Hypertension, Type II Diabetes, Neuropathy Date Acquired: 05/18/2021 Weeks Of Treatment: 2 Clustered Wound: No Photos Photo Uploaded By: Sandre Kitty on 06/10/2021 16:49:25 Wound Measurements Length: (cm) 1.8 Width: (cm) 1.5 Depth: (cm) 0.1 Area: (cm) 2.121 Volume: (cm) 0.212 % Reduction in Area: -20% % Reduction in Volume: 39.9% Epithelialization: Small (1-33%) Tunneling: No Undermining: No Wound Description Classification: Grade 1 Wound Margin: Distinct, outline attached Exudate Amount: Medium Exudate Type: Serosanguineous Exudate Color: red, brown Foul Odor After Cleansing: No Slough/Fibrino Yes Wound Bed Granulation Amount: Medium (34-66%) Exposed Structure Granulation Quality: Red Fascia Exposed: No Necrotic Amount: Medium (34-66%) Fat Layer (Subcutaneous  Tissue) Exposed: Yes Necrotic Quality: Adherent Slough Tendon Exposed: No Muscle Exposed: No Joint Exposed: No Bone Exposed: No Treatment Notes Wound #4 (Toe Second) Wound Laterality: Right Cleanser Peri-Wound Care Topical Mupirocin Ointment Discharge Instruction: Apply Mupirocin (Bactroban) thin film to open areas Primary Dressing Xeroform Occlusive Gauze Dressing, 4x4 in Discharge Instruction: Apply to wound bed as instructed Secondary Dressing Woven Gauze Sponges 2x2 in Discharge Instruction: Apply over primary dressing as directed. Secured With Conforming Stretch Gauze Bandage, Sterile 2x75 (in/in) Discharge Instruction: Secure with stretch gauze . wrap toes individually Compression Wrap Compression Stockings Add-Ons Electronic Signature(s) Signed: 06/10/2021 5:07:06 PM By: Leane Call Entered By: Leane Call on 06/10/2021 08:59:32 -------------------------------------------------------------------------------- Wound Assessment Details Patient Name: Date of Service: Sarah Haring R. 06/10/2021 8:45 A M Medical Record Number: [id number] Patient Account Number: [id number] Date of Birth/Sex: Treating RN: [birth year] (50 y.o. Nita Sells Primary Care Casondra Gasca: Asencion Noble Other Clinician: Referring  Khaleel Beckom: Treating Maysie Parkhill/Extender: Emelda Brothers in Treatment: 2 Wound Status Wound Number: 5 Primary Etiology: Diabetic Wound/Ulcer of the Lower Extremity Wound Location: Left T Fifth oe Wound Status: Open Wounding Event: Blister Comorbid History: Glaucoma, Hypertension, Type II Diabetes, Neuropathy Date Acquired: 05/18/2021 Weeks Of Treatment: 2 Clustered Wound: No Photos Photo Uploaded By: Sandre Kitty on 06/10/2021 16:49:10 Wound Measurements Length: (cm) 0.9 Width: (cm) 0.6 Depth: (cm) 0.1 Area: (cm) 0.424 Volume: (cm) 0.042 % Reduction in Area: 72.7% % Reduction in Volume: 73.1% Epithelialization: Medium  (34-66%) Tunneling: No Undermining: No Wound Description Classification: Grade 1 Wound Margin: Distinct, outline attached Exudate Amount: Medium Exudate Type: Serous Exudate Color: amber Wound Bed Granulation Amount: Medium (34-66%) Granulation Quality: Pink Necrotic Amount: Medium (34-66%) Necrotic Quality: Adherent Slough Foul Odor After Cleansing: No Slough/Fibrino No Exposed Structure Fascia Exposed: No Fat Layer (Subcutaneous Tissue) Exposed: Yes Tendon Exposed: No Muscle Exposed: No Joint Exposed: No Bone Exposed: No Treatment Notes Wound #5 (Toe Fifth) Wound Laterality: Left Cleanser Peri-Wound Care Topical Mupirocin Ointment Discharge Instruction: Apply Mupirocin (Bactroban) thin film to open areas Primary Dressing Xeroform Occlusive Gauze Dressing, 4x4 in Discharge Instruction: Apply to wound bed as instructed Secondary Dressing Woven Gauze Sponges 2x2 in Discharge Instruction: Apply over primary dressing as directed. Secured With Conforming Stretch Gauze Bandage, Sterile 2x75 (in/in) Discharge Instruction: Secure with stretch gauze . wrap toes individually Compression Wrap Compression Stockings Add-Ons Electronic Signature(s) Signed: 06/10/2021 5:07:06 PM By: Leane Call Entered By: Leane Call on 06/10/2021 09:00:05 -------------------------------------------------------------------------------- Vitals Details Patient Name: Date of Service: Sarah Haring R. 06/10/2021 8:45 A M Medical Record Number: [id number] Patient Account Number: [id number] Date of Birth/Sex: Treating RN: November 03, 1971 (50 y.o. [facility] Mara Favero: Asencion Noble Other Clinician: Referring Lillianna Sabel: Treating Marlette Curvin/Extender: Emelda Brothers in Treatment: 2 Vital Signs Time Taken: 08:43 Temperature (F): 97.9 Height (in): 64 Pulse (bpm): 87 Weight (lbs): 200 Respiratory Rate (breaths/min): 18 Body Mass Index (BMI):  34.3 Blood Pressure (mmHg): 143/87 Reference Range: 80 - 120 mg / dl Airway Pulse Oximetry (%): 96 Electronic Signature(s) Signed: 06/10/2021 5:07:06 PM By: Leane Call Entered By: Leane Call on 06/10/2021 08:45:24

## 2021-06-11 ENCOUNTER — Other Ambulatory Visit: Payer: Self-pay

## 2021-06-11 ENCOUNTER — Ambulatory Visit (HOSPITAL_COMMUNITY)
Admission: RE | Admit: 2021-06-11 | Discharge: 2021-06-11 | Disposition: A | Discharge status: Home / Self Care | Payer: Medicaid Other | Source: Ambulatory Visit | Attending: Cardiovascular Disease | Admitting: Cardiovascular Disease

## 2021-06-11 DIAGNOSIS — Z01818 Encounter for other preprocedural examination: Secondary | ICD-10-CM

## 2021-06-11 DIAGNOSIS — I739 Peripheral vascular disease, unspecified: Secondary | ICD-10-CM | POA: Diagnosis present

## 2021-06-15 ENCOUNTER — Telehealth: Payer: Self-pay | Admitting: *Deleted

## 2021-06-15 NOTE — Telephone Encounter (Signed)
Pt contacted pre-abdominal aortogram  scheduled at [facility] for: Wednesday June 17, 2021 8:30 AM Verified arrival time and place: [facility]) at: 6:30 AM   No solid food after midnight prior to cath, clear liquids until 5 AM day of procedure.  Hold: Insulin-AM of procedure/1/2 usual Insulin dose HS prior to procedure  Except hold medications AM meds can be  taken pre-cath with sips of water including: aspirin 81 mg   Confirmed patient has responsible adult to drive home post procedure and be with patient first 24 hours after arriving home:yes  You are allowed ONE visitor in the waiting room during the time you are at the hospital for your procedure. Both you and your visitor must wear a mask once you enter the hospital.   Patient reports does not currently have any symptoms concerning for COVID-19 and no household members with COVID-19 like illness.       Reviewed procedure/mask/visitor instructions with patient.

## 2021-06-16 ENCOUNTER — Other Ambulatory Visit: Payer: Self-pay

## 2021-06-17 ENCOUNTER — Encounter (HOSPITAL_COMMUNITY): Payer: Self-pay | Admitting: Cardiovascular Disease

## 2021-06-17 ENCOUNTER — Other Ambulatory Visit: Payer: Self-pay

## 2021-06-17 ENCOUNTER — Encounter (HOSPITAL_COMMUNITY)
Admission: RE | Disposition: A | Discharge status: Home / Self Care | Payer: Self-pay | Source: Home / Self Care | Attending: Cardiovascular Disease

## 2021-06-17 ENCOUNTER — Ambulatory Visit (HOSPITAL_COMMUNITY)
Admission: RE | Admit: 2021-06-17 | Discharge: 2021-06-17 | Disposition: A | Discharge status: Home / Self Care | Payer: Medicaid Other | Attending: Cardiovascular Disease | Admitting: Cardiovascular Disease

## 2021-06-17 DIAGNOSIS — E785 Hyperlipidemia, unspecified: Secondary | ICD-10-CM | POA: Diagnosis not present

## 2021-06-17 DIAGNOSIS — L97519 Non-pressure chronic ulcer of other part of right foot with unspecified severity: Secondary | ICD-10-CM | POA: Insufficient documentation

## 2021-06-17 DIAGNOSIS — Z794 Long term (current) use of insulin: Secondary | ICD-10-CM | POA: Insufficient documentation

## 2021-06-17 DIAGNOSIS — E1151 Type 2 diabetes mellitus with diabetic peripheral angiopathy without gangrene: Secondary | ICD-10-CM | POA: Insufficient documentation

## 2021-06-17 DIAGNOSIS — Z882 Allergy status to sulfonamides status: Secondary | ICD-10-CM | POA: Diagnosis not present

## 2021-06-17 DIAGNOSIS — L97509 Non-pressure chronic ulcer of other part of unspecified foot with unspecified severity: Secondary | ICD-10-CM | POA: Diagnosis not present

## 2021-06-17 DIAGNOSIS — F17210 Nicotine dependence, cigarettes, uncomplicated: Secondary | ICD-10-CM | POA: Diagnosis not present

## 2021-06-17 DIAGNOSIS — I739 Peripheral vascular disease, unspecified: Secondary | ICD-10-CM | POA: Diagnosis not present

## 2021-06-17 DIAGNOSIS — Z79899 Other long term (current) drug therapy: Secondary | ICD-10-CM | POA: Insufficient documentation

## 2021-06-17 DIAGNOSIS — E11621 Type 2 diabetes mellitus with foot ulcer: Secondary | ICD-10-CM | POA: Diagnosis not present

## 2021-06-17 DIAGNOSIS — Z950 Presence of cardiac pacemaker: Secondary | ICD-10-CM | POA: Insufficient documentation

## 2021-06-17 DIAGNOSIS — I110 Hypertensive heart disease with heart failure: Secondary | ICD-10-CM | POA: Insufficient documentation

## 2021-06-17 DIAGNOSIS — L97529 Non-pressure chronic ulcer of other part of left foot with unspecified severity: Secondary | ICD-10-CM | POA: Diagnosis not present

## 2021-06-17 DIAGNOSIS — I5032 Chronic diastolic (congestive) heart failure: Secondary | ICD-10-CM | POA: Insufficient documentation

## 2021-06-17 HISTORY — PX: ABDOMINAL AORTOGRAM W/LOWER EXTREMITY: CATH118223

## 2021-06-17 LAB — GLUCOSE, CAPILLARY
Glucose-Capillary: 426 mg/dL — ABNORMAL HIGH (ref 70–99)
Glucose-Capillary: 393 mg/dL — ABNORMAL HIGH (ref 70–99)
Glucose-Capillary: 362 mg/dL — ABNORMAL HIGH (ref 70–99)

## 2021-06-17 LAB — PREGNANCY, URINE: Preg Test, Ur: NEGATIVE

## 2021-06-17 SURGERY — ABDOMINAL AORTOGRAM W/LOWER EXTREMITY
Anesthesia: LOCAL

## 2021-06-17 MED ORDER — ACETAMINOPHEN 325 MG PO TABS: 650.0000 mg | ORAL_TABLET | ORAL | Status: DC | PRN

## 2021-06-17 MED ORDER — INSULIN ASPART 100 UNIT/ML IJ SOLN
10.0000 [IU] | Freq: Once | INTRAMUSCULAR | Status: AC
  Administered 2021-06-17: 10 [IU] via SUBCUTANEOUS
  Filled 2021-06-17: qty 1

## 2021-06-17 MED ORDER — SODIUM CHLORIDE 0.9% FLUSH: 3.0000 mL | Freq: Two times a day (BID) | INTRAVENOUS | Status: DC

## 2021-06-17 MED ORDER — LIDOCAINE HCL (PF) 1 % IJ SOLN
INTRAMUSCULAR | Status: DC | PRN
  Administered 2021-06-17: 15 mL via INTRADERMAL

## 2021-06-17 MED ORDER — LABETALOL HCL 5 MG/ML IV SOLN: 10.0000 mg | INTRAVENOUS | Status: DC | PRN

## 2021-06-17 MED ORDER — HEPARIN (PORCINE) IN NACL 1000-0.9 UT/500ML-% IV SOLN
INTRAVENOUS | Status: AC
  Filled 2021-06-17: qty 1000

## 2021-06-17 MED ORDER — MIDAZOLAM HCL 2 MG/2ML IJ SOLN
INTRAMUSCULAR | Status: DC | PRN
  Administered 2021-06-17 (×2): 1 mg via INTRAVENOUS

## 2021-06-17 MED ORDER — ASPIRIN 81 MG PO CHEW: 81.0000 mg | CHEWABLE_TABLET | ORAL | Status: DC

## 2021-06-17 MED ORDER — SODIUM CHLORIDE 0.9 % IV SOLN: INTRAVENOUS | Status: DC

## 2021-06-17 MED ORDER — SODIUM CHLORIDE 0.9 % IV SOLN
INTRAVENOUS | Status: DC
Start: 2021-06-17 — End: 2021-06-17

## 2021-06-17 MED ORDER — SODIUM CHLORIDE 0.9% FLUSH
3.0000 mL | INTRAVENOUS | Status: DC | PRN
Start: 2021-06-17 — End: 2021-06-17

## 2021-06-17 MED ORDER — HEPARIN (PORCINE) IN NACL 1000-0.9 UT/500ML-% IV SOLN
INTRAVENOUS | Status: DC | PRN
  Administered 2021-06-17 (×2): 500 mL

## 2021-06-17 MED ORDER — MIDAZOLAM HCL 2 MG/2ML IJ SOLN
INTRAMUSCULAR | Status: AC
  Filled 2021-06-17: qty 2

## 2021-06-17 MED ORDER — SODIUM CHLORIDE 0.9% FLUSH: 3.0000 mL | INTRAVENOUS | Status: DC | PRN

## 2021-06-17 MED ORDER — IODIXANOL 320 MG/ML IV SOLN
INTRAVENOUS | Status: DC | PRN
  Administered 2021-06-17: 110 mL via INTRA_ARTERIAL

## 2021-06-17 MED ORDER — SODIUM CHLORIDE 0.9 % IV SOLN: 250.0000 mL | INTRAVENOUS | Status: DC | PRN

## 2021-06-17 MED ORDER — FENTANYL CITRATE (PF) 100 MCG/2ML IJ SOLN
INTRAMUSCULAR | Status: AC
  Filled 2021-06-17: qty 2

## 2021-06-17 MED ORDER — FENTANYL CITRATE (PF) 100 MCG/2ML IJ SOLN
INTRAMUSCULAR | Status: DC | PRN
  Administered 2021-06-17: 25 ug via INTRAVENOUS
  Administered 2021-06-17: 50 ug via INTRAVENOUS

## 2021-06-17 MED ORDER — LIDOCAINE HCL (PF) 1 % IJ SOLN
INTRAMUSCULAR | Status: AC
  Filled 2021-06-17: qty 30

## 2021-06-17 SURGICAL SUPPLY — 15 items
CATH ANGIO 5F PIGTAIL 65CM (CATHETERS) ×1 IMPLANT
CATH CROSS OVER TEMPO 5F (CATHETERS) ×1 IMPLANT
CATH STRAIGHT 5FR 65CM (CATHETERS) ×1 IMPLANT
DEVICE CLOSURE MYNXGRIP 5F (Vascular Products) ×1 IMPLANT
GUIDEWIRE ANGLED .035X150CM (WIRE) ×1 IMPLANT
KIT MICROPUNCTURE NIT STIFF (SHEATH) ×1 IMPLANT
KIT PV (KITS) ×2 IMPLANT
SHEATH PINNACLE 5F 10CM (SHEATH) ×1 IMPLANT
SHEATH PROBE COVER 6X72 (BAG) ×1 IMPLANT
STOPCOCK MORSE 400PSI 3WAY (MISCELLANEOUS) ×1 IMPLANT
SYR MEDRAD MARK 7 150ML (SYRINGE) ×2 IMPLANT
TRANSDUCER W/STOPCOCK (MISCELLANEOUS) ×2 IMPLANT
TRAY PV CATH (CUSTOM PROCEDURE TRAY) ×2 IMPLANT
TUBING CIL FLEX 10 FLL-RA (TUBING) ×1 IMPLANT
WIRE HITORQ VERSACORE ST 145CM (WIRE) ×1 IMPLANT

## 2021-06-17 NOTE — Interval H&P Note (Signed)
History and Physical Interval Note:  06/17/2021 8:42 AM  Sarah Long  has presented today for surgery, with the diagnosis of pad w/ ulcer.  The various methods of treatment have been discussed with the patient and family. After consideration of risks, benefits and other options for treatment, the patient has consented to  Procedure(s): ABDOMINAL AORTOGRAM W/LOWER EXTREMITY (N/A) as a surgical intervention.  The patient's history has been reviewed, patient examined, no change in status, stable for surgery.  I have reviewed the patient's chart and labs.  Questions were answered to the patient's satisfaction.     [name]

## 2021-06-17 NOTE — Discharge Instructions (Signed)
Femoral Site Care This sheet gives you information about how to care for yourself after your procedure. Your health care provider may also give you more specific instructions. If you have problems or questions, contact your health care provider. What can I expect after the procedure? After the procedure, it is common to have: Bruising that usually fades within 1-2 weeks. Tenderness at the site. Follow these instructions at home: Wound care May remove bandage after 24 hours. Do not take baths, swim, or use a hot tub for 5 days. You may shower 24-48 hours after the procedure. Gently wash the site with plain soap and water. Pat the area dry with a clean towel. Do not rub the site. This may cause bleeding. Do not apply powder or lotion to the site. Keep the site clean and dry. Check your femoral site every day for signs of infection. Check for: Redness, swelling, or pain. Fluid or blood. Warmth. Pus or a bad smell. Activity For the first 2-3 days after your procedure, or as long as directed: Avoid climbing stairs as much as possible. Do not squat. Do not lift, push or pull anything that is heavier than 10 lb for 5 days. Rest as directed. Avoid sitting for a long time without moving. Get up to take short walks every 1-2 hours. Do not drive for 24 hours. General instructions Take over-the-counter and prescription medicines only as told by your health care provider. Keep all follow-up visits as told by your health care provider. This is important. DRINK PLENTY OF FLUIDS FOR THE NEXT 2-3 DAYS. Contact a health care provider if you have: A fever or chills. You have redness, swelling, or pain around your insertion site. Get help right away if: The catheter insertion area swells very fast. You pass out. You suddenly start to sweat or your skin gets clammy. The catheter insertion area is bleeding, and the bleeding does not stop when you hold steady pressure on the area. The area near or  just beyond the catheter insertion site becomes pale, cool, tingly, or numb. These symptoms may represent a serious problem that is an emergency. Do not wait to see if the symptoms will go away. Get medical help right away. Call your local emergency services (911 in the [place] Do not drive yourself to the hospital. Summary After the procedure, it is common to have bruising that usually fades within 1-2 weeks. Check your femoral site every day for signs of infection. Do not lift, push or pull anything that is heavier than 10 lb for 5 days.  This information is not intended to replace advice given to you by your health care provider. Make sure you discuss any questions you have with your health care provider. Document Revised: 12/05/2017 Document Reviewed: 12/05/2017 Elsevier Patient Education  2020 [employer]  

## 2021-06-18 ENCOUNTER — Telehealth: Payer: Self-pay

## 2021-06-18 ENCOUNTER — Encounter: Payer: Self-pay | Admitting: Critical Care Medicine

## 2021-06-18 ENCOUNTER — Other Ambulatory Visit: Payer: Self-pay

## 2021-06-18 ENCOUNTER — Ambulatory Visit: Payer: Medicaid Other | Attending: Critical Care Medicine | Admitting: Critical Care Medicine

## 2021-06-18 VITALS — BP 164/84 | HR 109 | Ht 64.0 in | Wt 204.0 lb

## 2021-06-18 DIAGNOSIS — K8681 Exocrine pancreatic insufficiency: Secondary | ICD-10-CM | POA: Diagnosis not present

## 2021-06-18 DIAGNOSIS — R200 Anesthesia of skin: Secondary | ICD-10-CM | POA: Diagnosis not present

## 2021-06-18 DIAGNOSIS — Z882 Allergy status to sulfonamides status: Secondary | ICD-10-CM | POA: Diagnosis not present

## 2021-06-18 DIAGNOSIS — F17200 Nicotine dependence, unspecified, uncomplicated: Secondary | ICD-10-CM

## 2021-06-18 DIAGNOSIS — F319 Bipolar disorder, unspecified: Secondary | ICD-10-CM | POA: Diagnosis not present

## 2021-06-18 DIAGNOSIS — Z5901 Sheltered homelessness: Secondary | ICD-10-CM | POA: Insufficient documentation

## 2021-06-18 DIAGNOSIS — E1142 Type 2 diabetes mellitus with diabetic polyneuropathy: Secondary | ICD-10-CM | POA: Insufficient documentation

## 2021-06-18 DIAGNOSIS — K859 Acute pancreatitis without necrosis or infection, unspecified: Secondary | ICD-10-CM | POA: Diagnosis not present

## 2021-06-18 DIAGNOSIS — Z794 Long term (current) use of insulin: Secondary | ICD-10-CM | POA: Diagnosis not present

## 2021-06-18 DIAGNOSIS — I442 Atrioventricular block, complete: Secondary | ICD-10-CM | POA: Insufficient documentation

## 2021-06-18 DIAGNOSIS — E1165 Type 2 diabetes mellitus with hyperglycemia: Secondary | ICD-10-CM

## 2021-06-18 DIAGNOSIS — Z79899 Other long term (current) drug therapy: Secondary | ICD-10-CM | POA: Insufficient documentation

## 2021-06-18 DIAGNOSIS — R42 Dizziness and giddiness: Secondary | ICD-10-CM | POA: Diagnosis present

## 2021-06-18 DIAGNOSIS — F17210 Nicotine dependence, cigarettes, uncomplicated: Secondary | ICD-10-CM | POA: Diagnosis not present

## 2021-06-18 DIAGNOSIS — Z23 Encounter for immunization: Secondary | ICD-10-CM

## 2021-06-18 DIAGNOSIS — I10 Essential (primary) hypertension: Secondary | ICD-10-CM | POA: Diagnosis not present

## 2021-06-18 DIAGNOSIS — E7800 Pure hypercholesterolemia, unspecified: Secondary | ICD-10-CM | POA: Diagnosis not present

## 2021-06-18 DIAGNOSIS — Z950 Presence of cardiac pacemaker: Secondary | ICD-10-CM | POA: Diagnosis not present

## 2021-06-18 DIAGNOSIS — L97501 Non-pressure chronic ulcer of other part of unspecified foot limited to breakdown of skin: Secondary | ICD-10-CM | POA: Diagnosis not present

## 2021-06-18 DIAGNOSIS — E11621 Type 2 diabetes mellitus with foot ulcer: Secondary | ICD-10-CM | POA: Insufficient documentation

## 2021-06-18 DIAGNOSIS — Z09 Encounter for follow-up examination after completed treatment for conditions other than malignant neoplasm: Secondary | ICD-10-CM | POA: Insufficient documentation

## 2021-06-18 LAB — POCT GLYCOSYLATED HEMOGLOBIN (HGB A1C): HbA1c, POC (controlled diabetic range): 13.6 % — AB (ref 0.0–7.0)

## 2021-06-18 LAB — GLUCOSE, POCT (MANUAL RESULT ENTRY)
POC Glucose: 452 mg/dl — AB (ref 70–99)
POC Glucose: 452 mg/dl — AB (ref 70–99)

## 2021-06-18 MED ORDER — ROSUVASTATIN CALCIUM 10 MG PO TABS
10.0000 mg | ORAL_TABLET | Freq: Every day | ORAL | 1 refills | Status: DC
  Filled 2021-06-18: qty 90, 90d supply, fill #0

## 2021-06-18 MED ORDER — LANTUS SOLOSTAR 100 UNIT/ML ~~LOC~~ SOPN
40.0000 [IU] | PEN_INJECTOR | Freq: Every day | SUBCUTANEOUS | 1 refills | Status: DC
  Filled 2021-06-18: qty 12, 30d supply, fill #0

## 2021-06-18 MED ORDER — BD PEN NEEDLE SHORT U/F 31G X 8 MM MISC
1.0000 "application " | Freq: Every day | 3 refills | Status: DC
  Filled 2021-06-18: qty 100, 25d supply, fill #0

## 2021-06-18 MED ORDER — ACCU-CHEK SOFTCLIX LANCETS MISC
6 refills | Status: DC
  Filled 2021-06-18: qty 100, fill #0

## 2021-06-18 MED ORDER — INSULIN ASPART 100 UNIT/ML IJ SOLN
8.0000 [IU] | Freq: Once | INTRAMUSCULAR | Status: AC
  Administered 2021-06-18: 8 [IU] via SUBCUTANEOUS

## 2021-06-18 MED ORDER — NOVOLOG FLEXPEN 100 UNIT/ML ~~LOC~~ SOPN
10.0000 [IU] | PEN_INJECTOR | Freq: Three times a day (TID) | SUBCUTANEOUS | 1 refills | Status: DC
  Filled 2021-06-18: qty 6, 10d supply, fill #0

## 2021-06-18 MED ORDER — CHLORTHALIDONE 25 MG PO TABS
25.0000 mg | ORAL_TABLET | Freq: Every day | ORAL | 1 refills | Status: DC
  Filled 2021-06-18: qty 90, 90d supply, fill #0

## 2021-06-18 MED ORDER — ACCU-CHEK GUIDE VI STRP
ORAL_STRIP | 6 refills | Status: DC
  Filled 2021-06-18: qty 100, fill #0

## 2021-06-18 MED ORDER — GABAPENTIN 300 MG PO CAPS
600.0000 mg | ORAL_CAPSULE | Freq: Three times a day (TID) | ORAL | 3 refills | Status: DC
  Filled 2021-06-18: qty 180, 30d supply, fill #0

## 2021-06-18 MED ORDER — ZENPEP 20000-63000 UNITS PO CPEP
1.0000 | ORAL_CAPSULE | Freq: Three times a day (TID) | ORAL | 4 refills | Status: DC
  Filled 2021-06-18: qty 100, 33d supply, fill #0

## 2021-06-18 NOTE — Assessment & Plan Note (Deleted)
POC glucose 452 in office. SQ insulin administered in office. She has been out of insulin and testing supplies for several months. Plan to refer to endocrinology and refill diabetes testing supplies. Patient met with pharmacist who agrees to start back on sliding scale humalog. She was urged to keep her appointment and follow up in 2 weeks.  

## 2021-06-18 NOTE — Assessment & Plan Note (Deleted)
POC glucose 452 in office. SQ insulin administered in office. She has been out of insulin and testing supplies for several months. Plan to refer to endocrinology and refill diabetes testing supplies. Patient met with pharmacist who agrees to start back on sliding scale humalog. She was urged to keep her appointment and follow up in 2 weeks.

## 2021-06-18 NOTE — Patient Instructions (Addendum)
Resume insulin Basaglar 40 units daily Resume insulin NovoLog sliding scale 3 times daily per instructions with Lurena Joiner Resume gabapentin 600 mg 3 times daily for neuropathy pain Resume Crestor 1 daily for cholesterol Begin chlorthalidone 1 pill daily for blood pressure Refills on your diabetic testing supplies were sent to the pharmacy Zen Pap 1 capsule 3 times daily before meals to help digest your food was sent to the pharmacy Keep your follow-up appointments with wound care center Keep your follow-up appointments with your mental health provider you are on Latuda daily Complete set of labs obtained today including a urine study for protein in the urine A pneumovax was given A referral to endocrinology was made Return to [facility] in two weeks and Dr Joya Gaskins in 6 weeks

## 2021-06-18 NOTE — Assessment & Plan Note (Addendum)
Elevation likely exacerbated with  dehydration and hyperglycemia. Chlorthalidone sent to pharmacy for her to begin.

## 2021-06-18 NOTE — Telephone Encounter (Signed)
At request of Dr Wright, met with the patient regarding food/ nutrition resources.  She explained that she lives with her 3 children in a motel.  She currently works 40 hours/week. Income about $1630/month (salary and food stamps).   She receives no financial support for her children.  She has a car and is aware of locations of free hot meals in the city. She said she has also received information about food pantries in the past. She has a large refrigerator.  She was in agreement to submitting an application to [employer] - nutrition program for monthly grocery assistance.  The referral was then submitted to Susan Cox, Program Director.    

## 2021-06-18 NOTE — Assessment & Plan Note (Signed)
Severe neuropathy with both numbness and pain  Plan resume gabapentin 600mg  tid

## 2021-06-18 NOTE — Assessment & Plan Note (Signed)
Pt with intact thought process and normal mood despite poor health. Continue Latuda managed by Psychiatry. Continue visits every 2 weeks.

## 2021-06-18 NOTE — Assessment & Plan Note (Signed)
Medial side of each big toe and distal second toe have superficial ulcers (stage II) and neuropathy. Continue dressing changes every other day and maintain visits with wound care. Wounds today do not look acutely infected. Angiography performed yesterday documents normal peripheral blood flow.

## 2021-06-18 NOTE — Assessment & Plan Note (Signed)
Has not yet started Crestor. Will refill and send to pharmacy.

## 2021-06-18 NOTE — Assessment & Plan Note (Signed)
Improving. Still nauseated with epigastric tenderness and diminished appetite. Prescribe Zenpep and urged her to do her best to eat 3 healthy meals per day.

## 2021-06-18 NOTE — Assessment & Plan Note (Addendum)
Based on SDOH/Homelessness/living in a motel room/ severe EPI, this pt does not digest her food and does not have access to healthy food choices and lives in [place] room with three children ages 12/8/7.   This results in inadequate healthy food choice/access.  Also not digesting food well with EPI. Symptomatic hyperglycemia with POC glucose 452 in office. 8 units SQ novolog administered in office. She has been out of insulin and testing supplies for several months.   Plan to resume insulin basaglar 40units daily and resume SSI with Novolog and connect with [facility] in two weeks and  refer to endocrinology and refill diabetes testing supplies. Patient met with pharmacist who agrees to start back on sliding scale humalog and long acting insulin. Also start Zenpep for EPI.  She was urged to keep her appointment and follow up in 2 weeks. Gabapentin called into pharmacy for diabetic neuropathy. Labs drawn today include CMP, A1C, and microalbumin. We also connected this patient to RN case manager Opal Sidles who gave pt a Medicaid healthy food access program

## 2021-06-18 NOTE — Progress Notes (Signed)
Subjective:    Patient ID: Sarah Long, female    DOB: October 21, 1971, 50 y.o.   MRN: [id number]  History of Present Illness:  This is a 50 year old female history of type 2 diabetes and bipolar disorder.  Patient was last seen in June and was found to have hypertension not well controlled, a holosystolic murmur which was found to be physiologic with normal echocardiogram, type 2 diabetes poorly controlled, bipolar disorder, homelessness, and ongoing tobacco use.  The patient now is moved back to the homeless shelter away from the hotel she was housed in over the pandemic.  Note today on arrival blood sugar was greater than 500 she states she has not taken her insulin recently.  The patient has been compliant with her blood pressure medications however is still smoking a pack a day of cigarettes.  Patient has been compliant with her [name] that she receives from [facility] the [place].  The patient does have bunions in the feet but is yet to have her appointment with podiatry  10/02/2019 Patient is seen in follow-up from an August visit for poorly controlled type 2 diabetes, tobacco use, hyperlipidemia.  The patient lives in a shelter at [employer] and does work Building surveyor.  The patient often forgets to take her blood pressure medicine but she is taking her insulin daily.  Unfortunate her diet has been very noncompliant.  She eats lots of fast food and drinks sodas that contain sugar and sugar Gatorade.  She apparently had a very carbohydrate filled lunch along with sugar Sprite and sugar Gatorade and comes in with a CBG of 501 at this visit.  The patient is maintaining Lantus 80 units daily note she is already had her flu vaccine  She has improvement in her anxiety and depression.  She does complain of significant menstrual cramps that come on with her periods  The patient has tried ibuprofen 6 mg dose without relief.  03/11/2020 Has been in hosp since last ov The pt was  admitted severely ill with pancreatitis, AKI, multiple electrolyte disorders, DKA, complete heart block and evidence of heart failure without evidence of coronary artery disease on cardiac catheterization.  Below is the discharge summary  Dc summary:   Admit date: 02/09/2020 Discharge date: 02/26/2020 Consultations: Cardiology, [facility] Admitted From: home Disposition: home   Discharge Diagnoses:  Principal Problem:   Complete heart block ([place]) Active Problems:   Acute pancreatitis without infection or necrosis   Diabetic ketoacidosis without coma associated with type 2 diabetes mellitus (HCC)   Elevated troponin   NSTEMI (non-ST elevated myocardial infarction) (HCC)   Lactic acid acidosis   Endotracheally intubated   ACS (acute coronary syndrome) (HCC)   Prolonged QT interval   Acute on chronic combined systolic and diastolic CHF (congestive heart failure) [facility])     Hospital Course Summary: 50 yr old obese F w/ significant PMHx alcohol abuse, tobacco abuse, IDDM, HTN, HLD and morbid obesity presented to [facility] with 10/10 chest pain. Per the triage note pt reported vomiting all day, being short of breath and having severe chest pain feeling near syncopal, although she denied loss of consciousness or actual syncopal episode.  ED Course: Afebrile, BP 133/36mmHg HR 41 RR 26 O2 sat 94% .  CBG>$Remo'600mg'hlGmX$ /dl.  Bicarb 8.  AG 30. Na 131.  K 5.3. Cr 2.85.  Lipase 1879 AST 582.  ALT 294.  EKG showed an  Idioventricular rhythm with LAD, AVB, LVH and an inferior infarct (QRS 162  ms and QT/QTc 574/467 ms). Troponin 730>> 2000.  Chest x-ray showed mild interstitial prominence suggesting pulmonary edema, low lung volumes.  Patient went into complete AVB with HR of 39 while in ED requiring external pacer.  Done she becomes somnolent after IV Versed requiring intubation. Hospital course: Cardiology consulted and she was admitted to ICU.  She was also started on Unasyn for possible aspiration pneumonia.Patient  underwent LHC on 3/7 which was negative for CAD.  She had temporary venous pacemaker advanced at the same time.  Echo with EF of 40 to 45%, global hypokinesis, severe LVH and indeterminate diastolic function but no other significant finding.  Patient was extubated on 02/11/2020.She eventually had permanent PPM/ICD implant on 02/13/2020.Patient also received evaluation/treatment for acute pancreatitis, transaminitis, rhabdomyolysis, significant hypocalcemia/vitamin D deficiency and iron deficiency anemia while in ICU.  Patient transferred to [facility] service on 3/11.  Cardiac issues been stable.  Patient however has continued to complain of postprandial abdominal bloating, frequent diarrhea associated with worsening leukocytosis on lab work.  Repeat CT on 3/16 showed persistent/possible worsening of pancreatitis and started on Rocephin/Flagyl.  GI consulted and leukocytosis has been downtrending on antibiotics.  She has also had generalized edema during the hospital course with echo findings as above.   Acute pancreatitis with abdominal pain and watery diarrhea-Leukocytosis got worse on 3/16 with persistent diarrhea , stool study negative for cdiff, stool PCR panel negative , stool cultures negative for E. Coli/Campylobacter/Shigella/Salmonella etc. Repeat CT scan done on 3/16 showed worsening of pancreatitis- started on Cipro and Flagyl, seen by [facility], Dr. Paulita Fujita and started on pancrelipase supplement. She completed 5 days antibiotic while here. WBC count and lipase trending down,  diet advanced to soft and regular diet which she has been tolerating well >48 hrs.Cleared by GI to discharge on current dose of probiotic/pancreatic enzymes.  Okay to use as needed loperamide but patient no longer has diarrhea and dicontinued this medication in concern for prolonged qtc. She was receiving IV fentanyl for abdominal pain during the hospitals course. Changed to oral meds today. Reports cramping pain when having BM or on eating,  will discharge on Bentyl prn.  Follow-up [facility], Dr Paulita Fujita, in few weeks.   Anasarca: Patient noted to be clinically edematous with fluid overload during the hospital course.  She had net positive 18.7 L since admission per I's and O's documentation.  S/p 1 dose of Lasix on 3/17, x1 on 3/18, x1 on 3/19.  Improving ,encourage ambulation.  Nutrition supplement.Discontinued  sodium bicarb tablets that she has been recieveing to avoid salt/fluid retention. Started on oral lasix yesterday which she is tolerating well with stable creatinine.  Upper extremity edema is almost resolved but still has lower extremity edema.  Recommended to take Lasix 20 mg daily for rest of the week then change to as needed use.  Advised to take extra potassium with Lasix to avoid hypokalemia in the setting of prolonged QTC.     Non-STEMI,Complete Heart Block/bifascicular block (RBBB and LAFB) : Patient presented with severe chest pain and noted to be in complete heart block.    Seen by cardiology and underwent cardiac cath with no acute blockages.  She is now s/p PPM and ICD on 02/13/2020.  Device interrogation normal.-Cardiology  followed up on patient 3/22, Steri-Strips removed.  Wound healing well with no signs of redness, discharge or edema.  Cardiology cleared for discharge and stated okay to drive.  Okay for bath/shower but advised no lotions, creams or ointments  or powders on device site.   Acute systolic CHF with acute hypoxic respiratory failure: Present on admission with chest x-ray suggestive of cardiomegaly/pulmonary edema.  Echo on 3/7 with LVEF of 40 to 45%, global hypokinesis, severe LVH.  Required intubation in the ED.  Patient received significant amount of IV fluids for pancreatitis/rhabdomyolysis initially and then received IV diuretics, now saturating well on room air.  DC sodium bicarb tablets to avoid salt/fluid retention.  Discharged on low-dose oral Lasix with potassium as discussed above.  To follow-up  PCP, cardiology clinic in a week for further fluid/electrolyte balance assessment and titration of diuretics.  Not on ACE inhibitors due to AKI and hypotension.   Uncontrolled insulin dependent DM-2 with DKA: Present on admission.  Patient was struggling to get insulin supplies prior to admission.  Presented with DKA that has resolved.  A1c 12.7%.  Hospital course complicated by hypoglycemic as well as hyperglycemic episodes.C-peptide and insulin level within normal range.Currently on Lantus 22 units twice a day, meal coverage and sliding scales with blood glucose well controlled, less than 200.  Seen by diabetes educator and follow-up with  and wellness advised.  Medications ordered through wellness clinic.   AKI-multifactorial including cardiorenal, rhabdo and iatrogenic from ARB/HCTZ and NSAIDs Now resolved.  Avoid nephrotoxins.  DC sodium bicarb tablets as metabolic acidosis now resolved. Monitor  with periodic labs as outpatient while on oral lasix.   Hypokalemia/hypomagnesemia/hypophosphatemia-replaced as needed throughout the hospital course. Currently on scheduled potassium 65meq BID but potassium still only at 3.5, now also started on oral lasix.  Will continue potassium 40 mEq supplements upon discharge and advised additional potassium supplementation while taking Lasix.  Hopefully potassium level will come up once magnesium level improves and since diarrhea resolved.   Hypocalcemia/vitamin D deficiency: Calcium 4.8.  Multifactorial including vitamin D deficiency (< 4.2), rhabdo and acute pancreatitis.  PTH is appropriately high.  Urine calcium appropriately low at 4.3.-s/p Calcium gluconate 3 g.  Continue P.o. vitamin D 50,000 IU weekly and 2000 daily-suspect poor observation in the setting of acute pancreatitis.  Calcium level now improving, today is at 8.0, with last albumin level at 1.5 on 3/19.   QTc prolongation-monitored on telemetry during the hospital course, did have  arrhythmias/electrolyte abnormalities as explained above, keep mag close to 2 (currently at 1.8), potassium close to 4 (currently at 3.5).   Patient received 1 g of IV magnesium yesterday and 2 g of IV magnesium today.  Will discharge on oral magnesium supplements. Avoid QT prolonging agents,  QTC now down from 619 to 526 now (also EKG showing paced rhythm so possibly atrial QTC slightly lower).   Rhabdomyolysis: Due to alcohol?  Statin?  CK 855> 954> 994.  CK-MB low.  Received IV fluids during the hospital course.  Statins held.CK normalized now.  Recommend repeat lipid profile as outpatient.   Alcohol use disorder: Reportedly drinks about 5-6 drinks including liquors.  Counseled to quit. -Consulted TOC for resources.  Multivitamin/thiamine   Tobacco use disorder: Smokes about a pack a day.-Cessation counseling.  Nicotine patch while here   Obesity: Body mass index is 37.63 kg/m.  Counseled regarding risks and diet modifications.   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  She states her children were helping her find a place when she got sick and got admitted to the hospital.  Patient met with social worker and outpatient referrals have been placed for medication assistance, home health and DME..  Patient already has a PCP Dr. Joya Gaskins with home she plans to follow-up in 5 to 7 days.   Since discharge the patient states she is somewhat improved.  She has less swelling and edema.  She feels she may actually be too dry.  She has been taking insulin as prescribed.  She is no longer drinking alcohol.  She remains in a motel situation.  She has difficulty with sleeping.  She is quit smoking tobacco products as well.  Note blood sugar at home was 203 and on arrival here it was 342  Patient currently denies chest pain does have dyspnea with  exertion.  She needs follow-up appointment scheduled for cardiology and gastroenterology.  She also is requesting a scale  Wt Readings from Last 3 Encounters: 03/11/20 : 189 lb 9.6 oz (86 kg) 02/25/20 : 221 lb 9.6 oz (100.5 kg) 10/02/19 : 209 lb 12.8 oz (95.2 kg)   04/01/2020 Since the last visit the patient is improved.  She is eating better.  She complains of irritation in the mouth.  Her feet are peeling.  She is back at work and she works at [employer].  She has an upcoming cardiology appointment scheduled.  She is yet to hear from gastroenterology about her pancreatitis follow-up.  She now is on long-acting insulin 64 units daily and NovoLog 10 units before meals 3 times daily her fasting sugars now been in the 1 70-1 80 range.  She complains of a toothache in the right upper jaw along with right ear pain and wishes this to be evaluated she is on Latuda now for her bipolar disorder through [facility] the [place]  Note at the last visit her magnesium and phosphorus levels have returned to normal  05/13/2020 Patient seen in return follow-up with history of combined systolic diastolic heart failure and also pacemaker placement due to complete heart block.  Patient's been seen in cardiology clinic and now is on the [facility] 24/26 1 daily is off furosemide at this time.  She states her dyspnea is improved.  Blood glucoses have improved as well in the 1 7190 range however is a bit higher than has been in the past and she does have nausea and some emesis.  The patient does have chronic pancreatitis and is staying on her pancreatic enzyme replacement therapy.  She states Zofran has helped nausea in the past.  She still living in a hotel environment.  She still working nights.  Sometimes she will skip her evening dose of Basaglar.  She is up to 70 units daily on the [facility]  07/16/2020 This patient is seen return follow-up for combined systolic diastolic heart failure, hypertension, chronic  pancreatitis with pancreatic insufficiency, type 2 diabetes, hypercholesterolemia.  On arrival blood sugar was 358.  This patient's been having significant difficulty and following a significant diet.  The patient states that based on her work hours she is not able to eat meals on a regular basis she is now working from 3-11 and often misses several meals.  The patient states that she has had some vaginal discharge and some pain in the vaginal area.  She is compliant with her insulin.  She did run out of the long-acting medication is missed some of her insulin.  She tends to stop insulin at times.  She is not compliant with her diet.  Patient is still  living in a hotel environment.  She denies any chest pain or shortness of breath.  Still has significant dental caries as just received any form of dental care. The patient states her urine has been cloudy and there is been increased odor or vaginal discharge in her urine.  She complains of abdominal pain lower and back pain.  She denies any alcohol.  She still smoking 4 to 5 cigarettes a day.  06/18/2021 Patient returns to clinic for a medication follow up. She has unfortunately been off her insulin and lost her meter for the past several months and cannot recall the last time she self-administered insulin or checked her blood sugar. POC glucose in office was 452 and she was given SQ insulin in the office. Due to her chronic hyperglycemia, she is experiencing constant nausea, diminished appetite, dry heaves, postural dizziness, paresthesias to her hands and feet, and skin deterioration on each of her big toes. She is currently established with wound care for her feet and goes once a week for dressing changes. She has material to change her dressings at home every other day. She reports that her neuropathy has worsened in the last month with burning pain and reduced sensation to bilateral feet (left worse than right) and bilat hands. She is living and working at a  hotel and on average eats 2 meals per day, mostly lunch and dinner. She will also drink a large carton of apple juice regularly to prevent hypoglycemia. She was recently seen in the ER for acute pancreatitis and was treated with IV fluids. Yesterday she underwent an angiography of her right lower leg which was without obstruction. She continues to take her Latuda for bipolar disorder and follows up with her psychiatrist every 2 weeks. She does not drink alcohol but admits to smoking a few cigarettes (average 1/2 PPD) and has quit in the past.     Past Medical History:  Diagnosis Date   Abnormal Pap smear 1998   Abnormal vaginal bleeding 12/19/2011   Bacterial infection    Bipolar 1 disorder ([place])    Blister of second toe of left foot 11/21/2016   Candida vaginitis 07/2007   Depression    recently added wellbutrin-has not taken yet for bipolar   Diabetes in pregnancy    Diabetes mellitus    nph 20U qam and qpm, regular with meals   Diabetic ketoacidosis without coma associated with type 2 diabetes mellitus ([place])    Fibroid    Galactorrhea of right breast 2008   H/O amenorrhea 07/2007   H/O dizziness 10/14/11   H/O dysmenorrhea 2010   H/O menorrhagia 10/14/11   H/O varicella    Headache(784.0)    Heavy vaginal bleeding due to contraceptive injection use 10/12/2011   Depo Provera   Herpes    HSV-2 infection 01/03/2009   Hx: UTI (urinary tract infection) 2009   Hypertension    on aldomet   Increased BMI 2010   Irregular uterine bleeding 04/04/2012   Pt has mirena    Obesity 10/14/11   Oligomenorrhea 07/2007   Pelvic pain in female    Preterm labor    Trichomonas    Yeast infection      Family History  Problem Relation Age of Onset   Hypertension Mother    Diabetes Mother    Heart disease Mother    Hypertension Father    Diabetes Father    Heart disease Father    Stroke Maternal Grandfather    Other Neg  Hx      Social History   Socioeconomic History   Marital status:  Married    Spouse name: Not on file   Number of children: Not on file   Years of education: Not on file   Highest education level: Not on file  Occupational History   Not on file  Tobacco Use   Smoking status: Former    Packs/day: 0.50    Years: 20.00    Pack years: 10.00    Types: Cigarettes   Smokeless tobacco: Never  Vaping Use   Vaping Use: Never used  Substance and Sexual Activity   Alcohol use: No   Drug use: No   Sexual activity: Yes    Birth control/protection: None  Other Topics Concern   Not on file  Social History Narrative   Not on file   Social Determinants of Health   Financial Resource Strain: Not on file  Food Insecurity: Not on file  Transportation Needs: Not on file  Physical Activity: Not on file  Stress: Not on file  Social Connections: Not on file  Intimate Partner Violence: Not on file     Allergies  Allergen Reactions   Strawberry Extract Hives   Sulfa Antibiotics Hives and Itching     Outpatient Medications Prior to Visit  Medication Sig Dispense Refill   acetaminophen (TYLENOL) 650 MG CR tablet Take 1,950 mg by mouth daily as needed for pain.     Blood Glucose Monitoring Suppl (ACCU-CHEK GUIDE) w/Device KIT Use as directed daily 1 kit 0   Lancet Devices (ADJUSTABLE LANCING DEVICE) MISC Inject 1 each into the skin 3 (three) times daily. 100 each 5   lurasidone (LATUDA) 40 MG TABS tablet Take 1 tablet by mouth every evening with meals to start on or after 04/30/21 (Patient taking differently: Take 40 mg by mouth every evening.) 30 tablet 1   Multiple Vitamins-Minerals (ADULT ONE DAILY GUMMIES PO) Take 1 capsule by mouth daily.     mupirocin ointment (BACTROBAN) 2 % APPLY A THIN film OF OINTMENT to the open wounds on your toes every other day x 30 days or until healed (Patient taking differently: Apply 1 application topically every other day.) 22 g 2   Accu-Chek Softclix Lancets lancets Use as instructed to check blood sugar three times daily.  E11.65 100 each 6   Blood Glucose Monitoring Suppl (ACCU-CHEK GUIDE ME) w/Device KIT 1 kit by Does not apply route in the morning, at noon, and at bedtime. Use as instructed to check blood sugar three times daily. E11.65 1 kit 0   cephALEXin (KEFLEX) 500 MG capsule Take 500 mg by mouth 2 (two) times daily.     diphenhydrAMINE (BENADRYL) 25 mg capsule Take 25 mg by mouth every 6 (six) hours as needed for allergies.     doxycycline (VIBRAMYCIN) 100 MG capsule Take 1 capsule (100 mg total) by mouth 2 (two) times daily. 20 capsule 0   glucose blood (ACCU-CHEK GUIDE) test strip Use as instructed to check blood sugar three times daily. E11.65 100 each 6   glucose blood test strip Check your sugar in the morning before you eat breakfast, and one hour after a meal. 100 each 2   hydrOXYzine (ATARAX/VISTARIL) 25 MG tablet Take 1 tablet by mouth at bedtime as needed for sleep (Patient taking differently: Take 25 mg by mouth at bedtime as needed (sleep).) 30 tablet 1   insulin aspart (NOVOLOG FLEXPEN) 100 UNIT/ML FlexPen 10 unit with meals Sliding  scale three times daily: 0-150: 0 151-200: 2 units  201-250: 4 units  251-300: 6 units  301-350: 8 units  351- 400 10 units   >400  12 units (Patient taking differently: Inject 10-20 Units into the skin 3 (three) times daily with meals. 10 unit with meals Sliding scale three times daily: 0-150: 0 151-200: 2 units  201-250: 4 units  251-300: 6 units  301-350: 8 units  351- 400 10 units   >400  12 units) 15 mL 0   Insulin Pen Needle (B-D ULTRAFINE III SHORT PEN) 31G X 8 MM MISC 1 application by Does not apply route daily. 90 each 3   nystatin (MYCOSTATIN) 500000 units TABS tablet Take 500,000 Units by mouth daily.     Probiotic Product (PROBIOTIC PO) Take 1 capsule by mouth daily.     rosuvastatin (CRESTOR) 10 MG tablet Take 1 tablet (10 mg total) by mouth daily. 90 tablet 3   No facility-administered medications prior to visit.      Review of Systems   Constitutional:  Positive for appetite change, chills and fatigue. Negative for diaphoresis and fever.  HENT:  Positive for dental problem. Negative for congestion, rhinorrhea, sinus pain, sneezing and sore throat.   Eyes:  Negative for photophobia and visual disturbance.  Respiratory:  Negative for cough, chest tightness and shortness of breath.   Cardiovascular:  Positive for leg swelling. Negative for chest pain and palpitations.  Gastrointestinal:  Positive for abdominal pain and nausea. Negative for constipation, diarrhea and vomiting.  Genitourinary:  Negative for difficulty urinating, dysuria and menstrual problem.  Musculoskeletal:  Negative for arthralgias and myalgias.  Skin:  Positive for wound.  Neurological:  Positive for dizziness, tremors and numbness. Negative for seizures and syncope.  Psychiatric/Behavioral:  Positive for sleep disturbance. Negative for agitation, confusion and hallucinations.       Objective:   Physical Exam Constitutional:      Appearance: She is obese.     Comments: Not in acute distress but appears weak and fatigued. Slow to get up out of chair  HENT:     Head: Normocephalic and atraumatic.     Nose: Nose normal.     Mouth/Throat:     Mouth: Mucous membranes are dry.     Pharynx: Oropharynx is clear.  Eyes:     Pupils: Pupils are equal, round, and reactive to light.  Cardiovascular:     Rate and Rhythm: Normal rate and regular rhythm.     Pulses: Normal pulses.     Heart sounds: Normal heart sounds.  Pulmonary:     Effort: Pulmonary effort is normal.     Breath sounds: Normal breath sounds.  Abdominal:     General: Bowel sounds are normal. There is no distension.     Palpations: Abdomen is soft. There is no mass.     Tenderness: There is abdominal tenderness (epigastric and RUQ). There is no guarding or rebound.     Hernia: No hernia is present.  Musculoskeletal:     Cervical back: Neck supple.     Right lower leg: Edema present.      Left lower leg: Edema present.  Skin:    General: Skin is dry.     Comments: Diabetic foot ulcer on each medial big toe and distal 2nd toe. Skin on feet are dry, cracked and flaking   Neurological:     General: No focal deficit present.     Mental Status: She is alert.  Psychiatric:  Mood and Affect: Mood normal.        Behavior: Behavior normal.   Vitals:   06/18/21 0942  BP: (!) 164/84  Pulse: (!) 109  SpO2: 100%  Weight: 204 lb (92.5 kg)  Height: $Remove'5\' 4"'ovssYAb$  (1.626 m)   7/13 Abdominal aortogram   No significant aortoiliac disease. 2.  Left lower extremity: No significant obstructive disease with three-vessel runoff below the knee with intact pedal arch. 3.  Right lower extremity: No significant obstructive disease with three-vessel runoff below the knee.   Recommendations: The patient has mild nonobstructive disease.  Suspect that nonhealing ulcerations are due to uncontrolled diabetes.  She seems to have optimal blood flow. BMP Latest Ref Rng & Units 06/09/2021 03/29/2021 07/16/2020  Glucose 65 - 99 mg/dL 486(H) 225(H) 359(H)  BUN 6 - 24 mg/dL $Remove'21 15 15  'rsPByTO$ Creatinine 0.57 - 1.00 mg/dL 1.10(H) 0.86 0.94  BUN/Creat Ratio 9 - $R'23 19 17 16  'QE$ Sodium 134 - 144 mmol/L 130(L) 133(L) 136  Potassium 3.5 - 5.2 mmol/L 5.0 4.5 4.3  Chloride 96 - 106 mmol/L 96 101 101  CO2 20 - 29 mmol/L 20 18(L) 20  Calcium 8.7 - 10.2 mg/dL 8.9 8.9 9.3   Hepatic Function Latest Ref Rng & Units 03/29/2021 07/16/2020 03/11/2020  Total Protein 6.0 - 8.5 g/dL 6.6 7.5 7.8  Albumin 3.8 - 4.8 g/dL 3.7(L) 4.1 3.8  AST 0 - 40 IU/L 22 16 63(H)  ALT 0 - 32 IU/L $Remov'19 14 21  'kpLEqu$ Alk Phosphatase 44 - 121 IU/L 88 95 174(H)  Total Bilirubin 0.0 - 1.2 mg/dL 0.2 <0.2 0.2  Bilirubin, Direct 0.0 - 0.2 mg/dL - - -   CBC Latest Ref Rng & Units 06/09/2021 03/29/2021 07/16/2020  WBC 3.4 - 10.8 x10E3/uL 10.0 7.1 6.5  Hemoglobin 11.1 - 15.9 g/dL 10.0(L) 10.5(L) 11.6  Hematocrit 34.0 - 46.6 % 30.7(L) 32.5(L) 35.2  Platelets 150 - 450  x10E3/uL 312 216 229   Lipase     Component Value Date/Time   LIPASE 75 (H) 07/16/2020 9450        Assessment & Plan:  I personally reviewed all images and lab data in the [facility] system as well as any outside material available during this office visit and agree with the  radiology impressions.   Exocrine pancreatic insufficiency Improving. Still nauseated with epigastric tenderness and diminished appetite. Prescribe Zenpep and urged her to do her best to eat 3 healthy meals per day.    Hypercholesteremia Has not yet started Crestor. Will refill and send to pharmacy.   Diabetes type 2, uncontrolled ([place]) Based on SDOH/Homelessness/living in a motel room/ severe EPI, this pt does not digest her food and does not have access to healthy food choices and lives in [place] room with three children ages 12/8/7.   This results in inadequate healthy food choice/access.  Also not digesting food well with EPI. Symptomatic hyperglycemia with POC glucose 452 in office. 8 units SQ novolog administered in office. She has been out of insulin and testing supplies for several months.   Plan to resume insulin basaglar 40units daily and resume SSI with Novolog and connect with [facility] in two weeks and  refer to endocrinology and refill diabetes testing supplies. Patient met with pharmacist who agrees to start back on sliding scale humalog and long acting insulin. Also start Zenpep for EPI.  She was urged to keep her appointment and follow up in 2 weeks. Gabapentin called into pharmacy for diabetic neuropathy. Labs drawn today  include CMP, A1C, and microalbumin. We also connected this patient to RN case manager Opal Sidles who gave pt a Medicaid healthy food access program   HYPERTENSION, BENIGN SYSTEMIC Elevation likely exacerbated with  dehydration and hyperglycemia. Chlorthalidone sent to pharmacy for her to begin.   Diabetic foot ulcer (HCC) Medial side of each big toe and distal second toe have superficial  ulcers (stage II) and neuropathy. Continue dressing changes every other day and maintain visits with wound care. Wounds today do not look acutely infected. Angiography performed yesterday documents normal peripheral blood flow.   Bipolar 1 disorder ([place]) Pt with intact thought process and normal mood despite poor health. Continue Latuda managed by Psychiatry. Continue visits every 2 weeks.   DM type 2 with diabetic peripheral neuropathy (HCC) Severe neuropathy with both numbness and pain  Plan resume gabapentin $RemoveBeforeDEI'600mg'TVIjwucrOPYjSlYp$  tid   Smoking Pt states occasionally smoking , counseled to quit   Keya was seen today for hospitalization follow-up.  Diagnoses and all orders for this visit:  Uncontrolled type 2 diabetes mellitus with hyperglycemia (HCC) -     POCT glucose (manual entry) -     POCT glycosylated hemoglobin (Hb A1C) -     insulin aspart (novoLOG) injection 8 Units -     Accu-Chek Softclix Lancets lancets; Use as instructed to check blood sugar three times daily. E11.65 -     glucose blood (ACCU-CHEK GUIDE) test strip; Use as instructed to check blood sugar three times daily. E11.65 -     insulin aspart (NOVOLOG FLEXPEN) 100 UNIT/ML FlexPen; Inject 10-20 Units into the skin 3 (three) times daily with meals. 10 unit with meals Sliding scale three times daily: 0-150: 0 151-200: 2 units  201-250: 4 units  251-300: 6 units  301-350: 8 units  351- 400 10 units   >400  12 units -     Comprehensive metabolic panel -     Microalbumin/Creatinine Ratio, Urine -     Lipid panel -     Ambulatory referral to Endocrinology -     POCT glucose (manual entry)  Type 2 diabetes mellitus with diabetic polyneuropathy, with long-term current use of insulin (HCC) -     Insulin Pen Needle (B-D ULTRAFINE III SHORT PEN) 31G X 8 MM MISC; use as directed to inject insulin  Exocrine pancreatic insufficiency  Hypercholesteremia  HYPERTENSION, BENIGN SYSTEMIC  Diabetic ulcer of toe associated with type 2  diabetes mellitus, limited to breakdown of skin, unspecified laterality (HCC)  Bipolar 1 disorder (HCC)  DM type 2 with diabetic peripheral neuropathy (HCC)  Smoking  Other orders -     Pancrelipase, Lip-Prot-Amyl, (ZENPEP) 20000-63000 units CPEP; Take 1 capsule by mouth 3 (three) times daily before meals. -     rosuvastatin (CRESTOR) 10 MG tablet; Take 1 tablet (10 mg total) by mouth daily. -     insulin glargine (LANTUS SOLOSTAR) 100 UNIT/ML Solostar Pen; Inject 40 Units into the skin at bedtime. -     gabapentin (NEURONTIN) 300 MG capsule; Take 2 capsules (600 mg total) by mouth 3 (three) times daily. -     chlorthalidone (HYGROTON) 25 MG tablet; Take 1 tablet (25 mg total) by mouth daily. -     Pneumococcal conjugate vaccine 13-valent IM

## 2021-06-18 NOTE — Assessment & Plan Note (Signed)
Pt states occasionally smoking , counseled to quit

## 2021-06-19 ENCOUNTER — Other Ambulatory Visit: Payer: Self-pay

## 2021-06-19 LAB — LIPID PANEL
Chol/HDL Ratio: 2.6 ratio (ref 0.0–4.4)
Cholesterol, Total: 98 mg/dL — ABNORMAL LOW (ref 100–199)
HDL: 37 mg/dL — ABNORMAL LOW (ref >39)
LDL Chol Calc (NIH): 40 mg/dL (ref 0–99)
Triglycerides: 117 mg/dL (ref 0–149)
VLDL Cholesterol Cal: 21 mg/dL (ref 5–40)

## 2021-06-19 LAB — COMPREHENSIVE METABOLIC PANEL
ALT: 13 IU/L (ref 0–32)
AST: 15 IU/L (ref 0–40)
Albumin/Globulin Ratio: 0.8 — ABNORMAL LOW (ref 1.2–2.2)
Albumin: 3.2 g/dL — ABNORMAL LOW (ref 3.8–4.8)
Alkaline Phosphatase: 97 IU/L (ref 44–121)
BUN/Creatinine Ratio: 11 (ref 9–23)
BUN: 22 mg/dL (ref 6–24)
Bilirubin Total: 0.3 mg/dL (ref 0.0–1.2)
CO2: 20 mmol/L (ref 20–29)
Calcium: 8.2 mg/dL — ABNORMAL LOW (ref 8.7–10.2)
Chloride: 88 mmol/L — ABNORMAL LOW (ref 96–106)
Creatinine, Ser: 1.93 mg/dL — ABNORMAL HIGH (ref 0.57–1.00)
Globulin, Total: 3.9 g/dL (ref 1.5–4.5)
Glucose: 484 mg/dL — ABNORMAL HIGH (ref 65–99)
Potassium: 5 mmol/L (ref 3.5–5.2)
Sodium: 124 mmol/L — ABNORMAL LOW (ref 134–144)
Total Protein: 7.1 g/dL (ref 6.0–8.5)
eGFR: 32 mL/min/{1.73_m2} — ABNORMAL LOW (ref >59)

## 2021-06-19 LAB — MICROALBUMIN / CREATININE URINE RATIO
Creatinine, Urine: 74.7 mg/dL
Microalb/Creat Ratio: 729 mg/g creat — ABNORMAL HIGH (ref 0–29)
Microalbumin, Urine: 544.9 ug/mL

## 2021-06-23 ENCOUNTER — Encounter: Payer: Medicaid Other | Admitting: Cardiology

## 2021-06-23 NOTE — Progress Notes (Deleted)
Electrophysiology Office Note   Date:  06/23/2021   ID:  Sarah Long, DOB [birth year], MRN [id number]  PCP:  [name], MD  Cardiologist:  Radford Pax Primary Electrophysiologist:  Meredith Leeds, MD    Chief Complaint: pacemaker   History of Present Illness: Sarah Long is a 50 y.o. female who is being seen today for the evaluation of pacemaker at the request of [name], MD. Presenting today for electrophysiology evaluation.  She has a history significant for poorly controlled diabetes, hypertension, hyperlipidemia, tobacco abuse.  She presented to the hospital March 2021 with complete heart block.  She is status post [facility] Jude dual-chamber pacemaker implanted 02/13/2020.  Today, denies symptoms of palpitations, chest pain, shortness of breath, orthopnea, PND, lower extremity edema, claudication, dizziness, presyncope, syncope, bleeding, or neurologic sequela. The patient is tolerating medications without difficulties. ***    Past Medical History:  Diagnosis Date   Abnormal Pap smear 1998   Abnormal vaginal bleeding 12/19/2011   Bacterial infection    Bipolar 1 disorder (HCC)    Blister of second toe of left foot 11/21/2016   Candida vaginitis 07/2007   Depression    recently added wellbutrin-has not taken yet for bipolar   Diabetes in pregnancy    Diabetes mellitus    nph 20U qam and qpm, regular with meals   Diabetic ketoacidosis without coma associated with type 2 diabetes mellitus ([place])    Fibroid    Galactorrhea of right breast 2008   H/O amenorrhea 07/2007   H/O dizziness 10/14/11   H/O dysmenorrhea 2010   H/O menorrhagia 10/14/11   H/O varicella    Headache(784.0)    Heavy vaginal bleeding due to contraceptive injection use 10/12/2011   Depo Provera   Herpes    HSV-2 infection 01/03/2009   Hx: UTI (urinary tract infection) 2009   Hypertension    on aldomet   Increased BMI 2010   Irregular uterine bleeding 04/04/2012   Pt has mirena    Obesity  10/14/11   Oligomenorrhea 07/2007   Pelvic pain in female    Preterm labor    Trichomonas    Yeast infection    Past Surgical History:  Procedure Laterality Date   ABDOMINAL AORTOGRAM W/LOWER EXTREMITY N/A 06/17/2021   Procedure: ABDOMINAL AORTOGRAM W/LOWER EXTREMITY;  Surgeon: Wellington Hampshire, MD;  Location: [place] [facility];  Service: Cardiovascular;  Laterality: N/A;   CESAREAN SECTION  1991   LEFT HEART CATH AND CORONARY ANGIOGRAPHY N/A 02/10/2020   Procedure: LEFT HEART CATH AND CORONARY ANGIOGRAPHY;  Surgeon: Troy Sine, MD;  Location: [place] [facility];  Service: Cardiovascular;  Laterality: N/A;   PACEMAKER IMPLANT N/A 02/13/2020   Procedure: PACEMAKER IMPLANT;  Surgeon: Constance Haw, MD;  Location: [place] [facility];  Service: Cardiovascular;  Laterality: N/A;   TEMPORARY PACEMAKER N/A 02/10/2020   Procedure: TEMPORARY PACEMAKER;  Surgeon: Troy Sine, MD;  Location: [place] [facility];  Service: Cardiovascular;  Laterality: N/A;     Current Outpatient Medications  Medication Sig Dispense Refill   Accu-Chek Softclix Lancets lancets Use as instructed to check blood sugar three times daily. E11.65 100 each 6   acetaminophen (TYLENOL) 650 MG CR tablet Take 1,950 mg by mouth daily as needed for pain.     Blood Glucose Monitoring Suppl (ACCU-CHEK GUIDE) w/Device KIT Use as directed daily 1 kit 0   chlorthalidone (HYGROTON) 25 MG tablet Take 1 tablet (25 mg total) by mouth  daily. 90 tablet 1   gabapentin (NEURONTIN) 300 MG capsule Take 2 capsules (600 mg total) by mouth 3 (three) times daily. 180 capsule 3   glucose blood (ACCU-CHEK GUIDE) test strip Use as instructed to check blood sugar three times daily. E11.65 100 each 6   insulin aspart (NOVOLOG FLEXPEN) 100 UNIT/ML FlexPen Inject 10-20 Units into the skin 3 (three) times daily with meals. 10 unit with meals Sliding scale three times daily: 0-150: 0 151-200: 2 units  201-250: 4 units  251-300: 6 units  301-350: 8  units  351- 400 10 units   >400  12 units 6 mL 1   insulin glargine (LANTUS SOLOSTAR) 100 UNIT/ML Solostar Pen Inject 40 Units into the skin at bedtime. 12 mL 1   Insulin Pen Needle (B-D ULTRAFINE III SHORT PEN) 31G X 8 MM MISC use as directed to inject insulin 90 each 3   Lancet Devices (ADJUSTABLE LANCING DEVICE) MISC Inject 1 each into the skin 3 (three) times daily. 100 each 5   lurasidone (LATUDA) 40 MG TABS tablet Take 1 tablet by mouth every evening with meals to start on or after 04/30/21 (Patient taking differently: Take 40 mg by mouth every evening.) 30 tablet 1   Multiple Vitamins-Minerals (ADULT ONE DAILY GUMMIES PO) Take 1 capsule by mouth daily.     mupirocin ointment (BACTROBAN) 2 % APPLY A THIN film OF OINTMENT to the open wounds on your toes every other day x 30 days or until healed (Patient taking differently: Apply 1 application topically every other day.) 22 g 2   Pancrelipase, Lip-Prot-Amyl, (ZENPEP) 20000-63000 units CPEP Take 1 capsule by mouth 3 (three) times daily before meals. 180 capsule 4   rosuvastatin (CRESTOR) 10 MG tablet Take 1 tablet (10 mg total) by mouth daily. 90 tablet 1   No current facility-administered medications for this visit.    Allergies:   Strawberry extract and Sulfa antibiotics   Social History:  The patient  reports that she has quit smoking. Her smoking use included cigarettes. She has a 10.00 pack-year smoking history. She has never used smokeless tobacco. She reports that she does not drink alcohol and does not use drugs.   Family History:  The patient's family history includes Diabetes in her father and mother; Heart disease in her father and mother; Hypertension in her father and mother; Stroke in her maternal grandfather.   ROS:  Please see the history of present illness.   Otherwise, review of systems is positive for none.   All other systems are reviewed and negative.   PHYSICAL EXAM: VS:  There were no vitals taken for this visit. , BMI  There is no height or weight on file to calculate BMI. GEN: Well nourished, well developed, in no acute distress  HEENT: normal  Neck: no JVD, carotid bruits, or masses Cardiac: ***RRR; no murmurs, rubs, or gallops,no edema  Respiratory:  clear to auscultation bilaterally, normal work of breathing GI: soft, nontender, nondistended, + BS MS: no deformity or atrophy  Skin: warm and dry, device site well healed Neuro:  Strength and sensation are intact Psych: euthymic mood, full affect  EKG:  EKG {ACTION; IS/IS [id number]:[id number]} ordered today. Personal review of the ekg ordered *** shows ***  Personal review of the device interrogation today. Results in [place]: 03/29/2021: BNP 261.8 06/09/2021: Hemoglobin 10.0; Platelets 312 06/18/2021: ALT 13; BUN 22; Creatinine, Ser 1.93; Potassium 5.0; Sodium 124    Lipid Panel  Component Value Date/Time   CHOL 98 (L) 06/18/2021 1128   TRIG 117 06/18/2021 1128   HDL 37 (L) 06/18/2021 1128   CHOLHDL 2.6 06/18/2021 1128   CHOLHDL 3.5 04/28/2020 1511   VLDL 25 04/28/2020 1511   LDLCALC 40 06/18/2021 1128     Wt Readings from Last 3 Encounters:  06/18/21 204 lb (92.5 kg)  06/17/21 199 lb (90.3 kg)  06/09/21 206 lb 9.6 oz (93.7 kg)      Other studies Reviewed: Additional studies/ records that were reviewed today include: TTE 02/10/2020 Review of the above records today demonstrates:   1. Technically difficult; mild to moderate global reduction in LV  systolic function; severe LVH (myocardium with speckeld appearance;  suggest further evaluation for amyloid); mild MR.   2. Left ventricular ejection fraction, by estimation, is 40 to 45%. The  left ventricle has mildly decreased function. The left ventricle  demonstrates global hypokinesis. There is severe left ventricular  hypertrophy. Left ventricular diastolic parameters   are indeterminate.   3. Right ventricular systolic function is normal. The right ventricular  size is  normal. There is normal pulmonary artery systolic pressure.   4. The mitral valve is normal in structure and function. Mild mitral  valve regurgitation. No evidence of mitral stenosis.   5. The aortic valve is tricuspid. Aortic valve regurgitation is not  visualized. No aortic stenosis is present.   6. The inferior vena cava is dilated in size with <50% respiratory  variability, suggesting right atrial pressure of 15 mmHg.  Left heart cath 02/10/2020 Normal coronary arteries.   LVEDP 32 mmHg    ASSESSMENT AND PLAN:  1.  Complete heart block: Status post [employer] Jude dual-chamber pacemaker implanted 02/13/2020.  Device functioning appropriately.  No changes at this time.    2.  Hypertension:***    Current medicines are reviewed at length with the patient today.   The patient does not have concerns regarding her medicines.  The following changes were made today:  ***  Labs/ tests ordered today include:  No orders of the defined types were placed in this encounter.    Disposition:   FU with Larina Lieurance ***months  Signed, Willem Klingensmith Meredith Leeds, MD  06/23/2021 8:05 AM     [facility] [address] [place] [address] [phone] (office) [phone] (fax)

## 2021-06-25 ENCOUNTER — Emergency Department (HOSPITAL_COMMUNITY): Payer: Medicaid Other

## 2021-06-25 ENCOUNTER — Inpatient Hospital Stay (HOSPITAL_COMMUNITY): Payer: Medicaid Other

## 2021-06-25 ENCOUNTER — Inpatient Hospital Stay (HOSPITAL_COMMUNITY)
Admission: EM | Admit: 2021-06-25 | Discharge: 2021-07-01 | DRG: 853 | Disposition: A | Discharge status: Rehabilitation Facility | Payer: Medicaid Other | Attending: Family Medicine | Admitting: Family Medicine

## 2021-06-25 DIAGNOSIS — I96 Gangrene, not elsewhere classified: Secondary | ICD-10-CM

## 2021-06-25 DIAGNOSIS — R338 Other retention of urine: Secondary | ICD-10-CM

## 2021-06-25 DIAGNOSIS — E43 Unspecified severe protein-calorie malnutrition: Secondary | ICD-10-CM | POA: Diagnosis not present

## 2021-06-25 DIAGNOSIS — Z823 Family history of stroke: Secondary | ICD-10-CM

## 2021-06-25 DIAGNOSIS — Z6834 Body mass index (BMI) 34.0-34.9, adult: Secondary | ICD-10-CM

## 2021-06-25 DIAGNOSIS — F319 Bipolar disorder, unspecified: Secondary | ICD-10-CM | POA: Diagnosis present

## 2021-06-25 DIAGNOSIS — I425 Other restrictive cardiomyopathy: Secondary | ICD-10-CM | POA: Diagnosis present

## 2021-06-25 DIAGNOSIS — A419 Sepsis, unspecified organism: Secondary | ICD-10-CM | POA: Diagnosis not present

## 2021-06-25 DIAGNOSIS — E1142 Type 2 diabetes mellitus with diabetic polyneuropathy: Secondary | ICD-10-CM | POA: Diagnosis not present

## 2021-06-25 DIAGNOSIS — E0810 Diabetes mellitus due to underlying condition with ketoacidosis without coma: Secondary | ICD-10-CM | POA: Diagnosis not present

## 2021-06-25 DIAGNOSIS — N179 Acute kidney failure, unspecified: Secondary | ICD-10-CM

## 2021-06-25 DIAGNOSIS — K8681 Exocrine pancreatic insufficiency: Secondary | ICD-10-CM | POA: Diagnosis present

## 2021-06-25 DIAGNOSIS — E11621 Type 2 diabetes mellitus with foot ulcer: Secondary | ICD-10-CM | POA: Diagnosis not present

## 2021-06-25 DIAGNOSIS — E1152 Type 2 diabetes mellitus with diabetic peripheral angiopathy with gangrene: Secondary | ICD-10-CM | POA: Diagnosis not present

## 2021-06-25 DIAGNOSIS — E1110 Type 2 diabetes mellitus with ketoacidosis without coma: Secondary | ICD-10-CM | POA: Diagnosis not present

## 2021-06-25 DIAGNOSIS — Z20822 Contact with and (suspected) exposure to covid-19: Secondary | ICD-10-CM | POA: Diagnosis present

## 2021-06-25 DIAGNOSIS — Z452 Encounter for adjustment and management of vascular access device: Secondary | ICD-10-CM

## 2021-06-25 DIAGNOSIS — R6520 Severe sepsis without septic shock: Secondary | ICD-10-CM | POA: Diagnosis present

## 2021-06-25 DIAGNOSIS — I110 Hypertensive heart disease with heart failure: Secondary | ICD-10-CM | POA: Diagnosis present

## 2021-06-25 DIAGNOSIS — M868X7 Other osteomyelitis, ankle and foot: Secondary | ICD-10-CM | POA: Diagnosis present

## 2021-06-25 DIAGNOSIS — G9341 Metabolic encephalopathy: Secondary | ICD-10-CM | POA: Diagnosis not present

## 2021-06-25 DIAGNOSIS — F1010 Alcohol abuse, uncomplicated: Secondary | ICD-10-CM | POA: Diagnosis present

## 2021-06-25 DIAGNOSIS — I442 Atrioventricular block, complete: Secondary | ICD-10-CM | POA: Diagnosis not present

## 2021-06-25 DIAGNOSIS — Z79899 Other long term (current) drug therapy: Secondary | ICD-10-CM

## 2021-06-25 DIAGNOSIS — R0600 Dyspnea, unspecified: Secondary | ICD-10-CM | POA: Diagnosis present

## 2021-06-25 DIAGNOSIS — Z89512 Acquired absence of left leg below knee: Secondary | ICD-10-CM | POA: Diagnosis not present

## 2021-06-25 DIAGNOSIS — Z5900 Homelessness unspecified: Secondary | ICD-10-CM

## 2021-06-25 DIAGNOSIS — I5042 Chronic combined systolic (congestive) and diastolic (congestive) heart failure: Secondary | ICD-10-CM | POA: Diagnosis not present

## 2021-06-25 DIAGNOSIS — L97509 Non-pressure chronic ulcer of other part of unspecified foot with unspecified severity: Secondary | ICD-10-CM | POA: Diagnosis present

## 2021-06-25 DIAGNOSIS — E669 Obesity, unspecified: Secondary | ICD-10-CM | POA: Diagnosis present

## 2021-06-25 DIAGNOSIS — I5032 Chronic diastolic (congestive) heart failure: Secondary | ICD-10-CM | POA: Diagnosis not present

## 2021-06-25 DIAGNOSIS — L97522 Non-pressure chronic ulcer of other part of left foot with fat layer exposed: Secondary | ICD-10-CM

## 2021-06-25 DIAGNOSIS — D649 Anemia, unspecified: Secondary | ICD-10-CM | POA: Diagnosis present

## 2021-06-25 DIAGNOSIS — I739 Peripheral vascular disease, unspecified: Secondary | ICD-10-CM | POA: Diagnosis not present

## 2021-06-25 DIAGNOSIS — N170 Acute kidney failure with tubular necrosis: Secondary | ICD-10-CM | POA: Diagnosis not present

## 2021-06-25 DIAGNOSIS — E46 Unspecified protein-calorie malnutrition: Secondary | ICD-10-CM | POA: Diagnosis present

## 2021-06-25 DIAGNOSIS — Z833 Family history of diabetes mellitus: Secondary | ICD-10-CM

## 2021-06-25 DIAGNOSIS — K861 Other chronic pancreatitis: Secondary | ICD-10-CM | POA: Diagnosis present

## 2021-06-25 DIAGNOSIS — E1169 Type 2 diabetes mellitus with other specified complication: Secondary | ICD-10-CM | POA: Diagnosis present

## 2021-06-25 DIAGNOSIS — I10 Essential (primary) hypertension: Secondary | ICD-10-CM | POA: Diagnosis not present

## 2021-06-25 DIAGNOSIS — E1165 Type 2 diabetes mellitus with hyperglycemia: Secondary | ICD-10-CM | POA: Diagnosis present

## 2021-06-25 DIAGNOSIS — L97524 Non-pressure chronic ulcer of other part of left foot with necrosis of bone: Secondary | ICD-10-CM | POA: Diagnosis not present

## 2021-06-25 DIAGNOSIS — M726 Necrotizing fasciitis: Secondary | ICD-10-CM

## 2021-06-25 DIAGNOSIS — F3130 Bipolar disorder, current episode depressed, mild or moderate severity, unspecified: Secondary | ICD-10-CM | POA: Diagnosis not present

## 2021-06-25 DIAGNOSIS — Z794 Long term (current) use of insulin: Secondary | ICD-10-CM

## 2021-06-25 DIAGNOSIS — E1121 Type 2 diabetes mellitus with diabetic nephropathy: Secondary | ICD-10-CM | POA: Diagnosis present

## 2021-06-25 DIAGNOSIS — Z8249 Family history of ischemic heart disease and other diseases of the circulatory system: Secondary | ICD-10-CM

## 2021-06-25 DIAGNOSIS — Z882 Allergy status to sulfonamides status: Secondary | ICD-10-CM

## 2021-06-25 DIAGNOSIS — L97529 Non-pressure chronic ulcer of other part of left foot with unspecified severity: Secondary | ICD-10-CM | POA: Diagnosis present

## 2021-06-25 DIAGNOSIS — Z87891 Personal history of nicotine dependence: Secondary | ICD-10-CM

## 2021-06-25 DIAGNOSIS — E8809 Other disorders of plasma-protein metabolism, not elsewhere classified: Secondary | ICD-10-CM | POA: Diagnosis present

## 2021-06-25 DIAGNOSIS — L97519 Non-pressure chronic ulcer of other part of right foot with unspecified severity: Secondary | ICD-10-CM | POA: Diagnosis present

## 2021-06-25 DIAGNOSIS — I5022 Chronic systolic (congestive) heart failure: Secondary | ICD-10-CM

## 2021-06-25 DIAGNOSIS — I509 Heart failure, unspecified: Secondary | ICD-10-CM

## 2021-06-25 HISTORY — DX: Presence of cardiac pacemaker: Z95.0

## 2021-06-25 LAB — COMPREHENSIVE METABOLIC PANEL
ALT: 20 U/L (ref 0–44)
AST: 23 U/L (ref 15–41)
Albumin: 1.5 g/dL — ABNORMAL LOW (ref 3.5–5.0)
Alkaline Phosphatase: 101 U/L (ref 38–126)
Anion gap: 22 — ABNORMAL HIGH (ref 5–15)
BUN: 86 mg/dL — ABNORMAL HIGH (ref 6–20)
CO2: 12 mmol/L — ABNORMAL LOW (ref 22–32)
Calcium: 7.9 mg/dL — ABNORMAL LOW (ref 8.9–10.3)
Chloride: 89 mmol/L — ABNORMAL LOW (ref 98–111)
Creatinine, Ser: 3.8 mg/dL — ABNORMAL HIGH (ref 0.44–1.00)
GFR, Estimated: 14 mL/min — ABNORMAL LOW (ref >60)
Glucose, Bld: 848 mg/dL (ref 70–99)
Potassium: 4.6 mmol/L (ref 3.5–5.1)
Sodium: 123 mmol/L — ABNORMAL LOW (ref 135–145)
Total Bilirubin: 2.1 mg/dL — ABNORMAL HIGH (ref 0.3–1.2)
Total Protein: 7.6 g/dL (ref 6.5–8.1)

## 2021-06-25 LAB — CBC WITH DIFFERENTIAL/PLATELET
Abs Immature Granulocytes: 1.76 10*3/uL — ABNORMAL HIGH (ref 0.00–0.07)
Basophils Absolute: 0.1 10*3/uL (ref 0.0–0.1)
Basophils Relative: 0 %
Eosinophils Absolute: 0.1 10*3/uL (ref 0.0–0.5)
Eosinophils Relative: 0 %
HCT: 31.4 % — ABNORMAL LOW (ref 36.0–46.0)
Hemoglobin: 9.8 g/dL — ABNORMAL LOW (ref 12.0–15.0)
Immature Granulocytes: 5 %
Lymphocytes Relative: 5 %
Lymphs Abs: 2 10*3/uL (ref 0.7–4.0)
MCH: 28.5 pg (ref 26.0–34.0)
MCHC: 31.2 g/dL (ref 30.0–36.0)
MCV: 91.3 fL (ref 80.0–100.0)
Monocytes Absolute: 1.1 10*3/uL — ABNORMAL HIGH (ref 0.1–1.0)
Monocytes Relative: 3 %
Neutro Abs: 31.6 10*3/uL — ABNORMAL HIGH (ref 1.7–7.7)
Neutrophils Relative %: 87 %
Platelets: 406 10*3/uL — ABNORMAL HIGH (ref 150–400)
RBC: 3.44 MIL/uL — ABNORMAL LOW (ref 3.87–5.11)
RDW: 15.6 % — ABNORMAL HIGH (ref 11.5–15.5)
Smear Review: NORMAL
WBC: 36.6 10*3/uL — ABNORMAL HIGH (ref 4.0–10.5)
nRBC: 0 % (ref 0.0–0.2)

## 2021-06-25 LAB — CBG MONITORING, ED
Glucose-Capillary: >600 mg/dL (ref 70–99)
Glucose-Capillary: >600 mg/dL (ref 70–99)
Glucose-Capillary: >600 mg/dL (ref 70–99)
Glucose-Capillary: >600 mg/dL (ref 70–99)
Glucose-Capillary: >600 mg/dL (ref 70–99)
Glucose-Capillary: >600 mg/dL (ref 70–99)
Glucose-Capillary: >600 mg/dL (ref 70–99)
Glucose-Capillary: >600 mg/dL (ref 70–99)

## 2021-06-25 LAB — I-STAT ARTERIAL BLOOD GAS, ED
Acid-base deficit: 7 mmol/L — ABNORMAL HIGH (ref 0.0–2.0)
Bicarbonate: 15.9 mmol/L — ABNORMAL LOW (ref 20.0–28.0)
Calcium, Ion: 1.05 mmol/L — ABNORMAL LOW (ref 1.15–1.40)
HCT: 29 % — ABNORMAL LOW (ref 36.0–46.0)
Hemoglobin: 9.9 g/dL — ABNORMAL LOW (ref 12.0–15.0)
O2 Saturation: 98 %
Patient temperature: 98.3
Potassium: 3.6 mmol/L (ref 3.5–5.1)
Sodium: 131 mmol/L — ABNORMAL LOW (ref 135–145)
TCO2: 17 mmol/L — ABNORMAL LOW (ref 22–32)
pCO2 arterial: 22.6 mmHg — ABNORMAL LOW (ref 32.0–48.0)
pH, Arterial: 7.454 — ABNORMAL HIGH (ref 7.350–7.450)
pO2, Arterial: 89 mmHg (ref 83.0–108.0)

## 2021-06-25 LAB — I-STAT CHEM 8, ED
BUN: 72 mg/dL — ABNORMAL HIGH (ref 6–20)
Calcium, Ion: 0.81 mmol/L — CL (ref 1.15–1.40)
Chloride: 92 mmol/L — ABNORMAL LOW (ref 98–111)
Creatinine, Ser: 3.2 mg/dL — ABNORMAL HIGH (ref 0.44–1.00)
Glucose, Bld: >700 mg/dL (ref 70–99)
HCT: 35 % — ABNORMAL LOW (ref 36.0–46.0)
Hemoglobin: 11.9 g/dL — ABNORMAL LOW (ref 12.0–15.0)
Potassium: 4.2 mmol/L (ref 3.5–5.1)
Sodium: 123 mmol/L — ABNORMAL LOW (ref 135–145)
TCO2: 14 mmol/L — ABNORMAL LOW (ref 22–32)

## 2021-06-25 LAB — I-STAT VENOUS BLOOD GAS, ED
Acid-base deficit: 10 mmol/L — ABNORMAL HIGH (ref 0.0–2.0)
Bicarbonate: 13.7 mmol/L — ABNORMAL LOW (ref 20.0–28.0)
Calcium, Ion: 0.97 mmol/L — ABNORMAL LOW (ref 1.15–1.40)
HCT: 30 % — ABNORMAL LOW (ref 36.0–46.0)
Hemoglobin: 10.2 g/dL — ABNORMAL LOW (ref 12.0–15.0)
O2 Saturation: 96 %
Potassium: 3.7 mmol/L (ref 3.5–5.1)
Sodium: 130 mmol/L — ABNORMAL LOW (ref 135–145)
TCO2: 14 mmol/L — ABNORMAL LOW (ref 22–32)
pCO2, Ven: 23.7 mmHg — ABNORMAL LOW (ref 44.0–60.0)
pH, Ven: 7.371 (ref 7.250–7.430)
pO2, Ven: 81 mmHg — ABNORMAL HIGH (ref 32.0–45.0)

## 2021-06-25 LAB — BASIC METABOLIC PANEL
Anion gap: 20 — ABNORMAL HIGH (ref 5–15)
BUN: 82 mg/dL — ABNORMAL HIGH (ref 6–20)
CO2: 14 mmol/L — ABNORMAL LOW (ref 22–32)
Calcium: 7.5 mg/dL — ABNORMAL LOW (ref 8.9–10.3)
Chloride: 93 mmol/L — ABNORMAL LOW (ref 98–111)
Creatinine, Ser: 3.46 mg/dL — ABNORMAL HIGH (ref 0.44–1.00)
GFR, Estimated: 16 mL/min — ABNORMAL LOW (ref >60)
Glucose, Bld: 699 mg/dL (ref 70–99)
Potassium: 3.8 mmol/L (ref 3.5–5.1)
Sodium: 127 mmol/L — ABNORMAL LOW (ref 135–145)

## 2021-06-25 LAB — RESP PANEL BY RT-PCR (FLU A&B, COVID) ARPGX2
Influenza A by PCR: NEGATIVE
Influenza B by PCR: NEGATIVE
SARS Coronavirus 2 by RT PCR: NEGATIVE

## 2021-06-25 LAB — APTT: aPTT: 27 seconds (ref 24–36)

## 2021-06-25 LAB — BETA-HYDROXYBUTYRIC ACID: Beta-Hydroxybutyric Acid: 5.37 mmol/L — ABNORMAL HIGH (ref 0.05–0.27)

## 2021-06-25 LAB — PROTIME-INR
INR: 1.5 — ABNORMAL HIGH (ref 0.8–1.2)
Prothrombin Time: 17.9 seconds — ABNORMAL HIGH (ref 11.4–15.2)

## 2021-06-25 LAB — LACTIC ACID, PLASMA
Lactic Acid, Venous: 3.4 mmol/L (ref 0.5–1.9)
Lactic Acid, Venous: 2.5 mmol/L (ref 0.5–1.9)

## 2021-06-25 MED ORDER — INSULIN REGULAR(HUMAN) IN NACL 100-0.9 UT/100ML-% IV SOLN
INTRAVENOUS | Status: DC
  Administered 2021-06-25: 11.5 [IU]/h via INTRAVENOUS
  Administered 2021-06-26: 13 [IU]/h via INTRAVENOUS
  Filled 2021-06-25 (×2): qty 100

## 2021-06-25 MED ORDER — POTASSIUM CHLORIDE IN NACL 40-0.9 MEQ/L-% IV SOLN
INTRAVENOUS | Status: DC
  Filled 2021-06-25 (×3): qty 1000

## 2021-06-25 MED ORDER — SODIUM CHLORIDE 0.9 % IV BOLUS
2000.0000 mL | Freq: Once | INTRAVENOUS | Status: AC
  Administered 2021-06-25: 2000 mL via INTRAVENOUS

## 2021-06-25 MED ORDER — DEXTROSE IN LACTATED RINGERS 5 % IV SOLN: INTRAVENOUS | Status: DC

## 2021-06-25 MED ORDER — SODIUM CHLORIDE 0.9 % IV BOLUS
1000.0000 mL | Freq: Once | INTRAVENOUS | Status: AC
  Administered 2021-06-25: 1000 mL via INTRAVENOUS

## 2021-06-25 MED ORDER — VANCOMYCIN VARIABLE DOSE PER UNSTABLE RENAL FUNCTION (PHARMACIST DOSING): Status: DC

## 2021-06-25 MED ORDER — POTASSIUM CHLORIDE 10 MEQ/100ML IV SOLN
10.0000 meq | INTRAVENOUS | Status: DC
  Administered 2021-06-25 (×2): 10 meq via INTRAVENOUS
  Filled 2021-06-25 (×2): qty 100

## 2021-06-25 MED ORDER — SODIUM CHLORIDE 0.9 % IV SOLN
2.0000 g | INTRAVENOUS | Status: DC
Start: 2021-06-25 — End: 2021-06-26
  Administered 2021-06-25: 2 g via INTRAVENOUS
  Filled 2021-06-25 (×2): qty 2

## 2021-06-25 MED ORDER — VANCOMYCIN HCL 1750 MG/350ML IV SOLN
1750.0000 mg | Freq: Once | INTRAVENOUS | Status: AC
  Administered 2021-06-25: 1750 mg via INTRAVENOUS
  Filled 2021-06-25: qty 350

## 2021-06-25 MED ORDER — DEXTROSE 50 % IV SOLN: 0.0000 mL | INTRAVENOUS | Status: DC | PRN

## 2021-06-25 MED ORDER — LACTATED RINGERS IV SOLN: INTRAVENOUS | Status: DC

## 2021-06-25 MED ORDER — SODIUM CHLORIDE 0.9 % IV SOLN
2.0000 g | Freq: Once | INTRAVENOUS | Status: AC
  Administered 2021-06-25: 2 g via INTRAVENOUS
  Filled 2021-06-25: qty 20

## 2021-06-25 MED ORDER — CLINDAMYCIN PHOSPHATE 600 MG/50ML IV SOLN
600.0000 mg | Freq: Three times a day (TID) | INTRAVENOUS | Status: AC
  Administered 2021-06-25 – 2021-06-28 (×8): 600 mg via INTRAVENOUS
  Filled 2021-06-25 (×8): qty 50

## 2021-06-25 MED ORDER — SODIUM CHLORIDE 0.9 % IV SOLN: 2.0000 g | INTRAVENOUS | Status: DC

## 2021-06-25 MED ORDER — ROSUVASTATIN CALCIUM 5 MG PO TABS
10.0000 mg | ORAL_TABLET | Freq: Every day | ORAL | Status: DC
  Administered 2021-06-26 – 2021-07-01 (×6): 10 mg via ORAL
  Filled 2021-06-25 (×6): qty 2

## 2021-06-25 NOTE — ED Notes (Signed)
Glucose 699 . MD made aware.

## 2021-06-25 NOTE — Progress Notes (Signed)
Per bedside RN pt is difficult stick and blood cultures not drawn yet. Antibiotics already given.

## 2021-06-25 NOTE — Sepsis Progress Note (Addendum)
Notified bedside nurse of need to draw blood cultures.   Blood cultures have been unobtainable. Please be advised to re-attempt collection if appropriate.

## 2021-06-25 NOTE — Significant Event (Signed)
Consulted orthopedist on call, Dr. Phylliss Bob. He stated the ED contacted him previously and he is aware. He has already contacted his foot and ankle specialist, Dr. Radene Journey. I relayed that patient currently unable to get an MRI due to a pacemaker in place. Dr. Lynann Bologna will check with Dr. Lucia Gaskins about alternative imaging and get back with [place].   Ezequiel Essex, MD PGY-2, [place] [facility] pager [phone]

## 2021-06-25 NOTE — H&P (Addendum)
[facility] Admission History and Physical Service Pager: [phone]  Patient name: Sarah Long Medical record number: [id number] Date of birth: 02/21/1971 Age: 50 y.o. Gender: female  Primary Care Provider: [name], MD Consultants: Ortho Code Status: FULL  Preferred Emergency Contact:  Contact Information     Name Relation Home Work Mobile   [place] Daughter   [phone]   vendetta, pittinger Daughter   [phone]        Chief Complaint: hyperglycemia  Assessment and Plan: Sarah Long is a 50 y.o. female presenting with worsening fatigue, polydipsia and nausea in the setting of DKA . PMH is significant for T2DM, HTN, HLD, CHF, pancreatitis, hx of ETOh abuse, and Bipolar and depression.    DKA in the setting of uncontrolled T66DM 50 year old female with uncontrolled type 2 diabetes presented to the ED due to concerns of hyperglycemia. She has been diabetic since age 89. Home meds: basaglar 40units and novolog but patient has not bene compliant. Patient said she checked her blood glucose yesterday and it was over 500. Today her daughter called 911 after noticing worsening fatigue, loss of appetite, dizziness, vomiting with and polydipsia. Although oriented x4 in the ER, she was confused and muddled which is not her baseline according to the daughter. Her glucose on arrival was 848. Lactic acid of 3.4 and Anion Gap of 22. Other lab values include  Na-123, Cl 89, BUN 86, K 4.6, Ca 7.9, CO2 12. In the ED patient was started on endotool and despite receiving IV fluid of LR patient's CBG remained very high at over 600. ABG: pH 7.45, bicarb 15, co2 22.6 indicating no acidemia however this was likely 2/2 bicarb in LR fluid pt received. Patient continued to show signs of AMS and CCM was consulted. Dr. Carson Myrtle recommended to stop the IV LR and switch to N. Saline with 34meq of K at a rate of 250cc/hr. He advised that patient does meet criteria for ICU  admission. Of  note pt has necrotizing fascitis of her left foot and possibly right foot too. This is very likely contributing to her DKA. -[place] admitting to progressive, Attending Dr. Wendy Poet  -Vitals per floor routine -Up with assistance -Check BMP q4h -CBG every hour -Continue endotool -Transition off endotool when CBG <250 -Continue Normal Saline with 70meq K at 250cc/hr -IV Phenergan 6.$RemoveBeforeD'25mg'tnZgkTXiZNmOcw$  x 1 dose -NPO -Trend Lactic Acids -PT/OT am  Necrotizing Fasciitis of left foot  PAD  Non-healing ulcers  Pt follows Dr [facility] for PAD and LE ulcers.. Last clinic visit was 06/09/21. Per chart review, patient developed blisters and ulcerations of first and second toes of both feet in June 22.  At this time dark discoloration of her toes and edema.  Dr Fletcher Anon recommended abdominal aortogram on 06/17/21 due to nonpalpable posterior tibial pulses bilaterally. Abdominal aortogram on 06/17/21:   Minor irregularities left popliteal and peroneal arteries. On exam the left foot there were signs of gangrene on all 5 toes with black discoloration. The foot is edematous with raised skin tissues. The right foot showed gangrene of the big toe and second toe. Patient is afebrile with overall stable vitals. She does have significant leukocytosis of 36. Ordered MRI of the foot but patient hx of pacemaker so was unable to get this STAT. Consulted ortho, Dr  Lynann Bologna who recommend a CT without contrast of both the right and left foot. He suggested possible amputation tomorrow pending further evaluation. S/p CTX and Vancomycin in the  ER. -Ortho following, appreciate recs -Monitor fever curve -Cefepime 2 g every 24 hours -Clindamycin 600 mg IV every 8 hours -Vancomycin per pharmacy  -F/u blood culture (taken after antibiotics were started as patient was a hard stick) -NPO from midnight  -Possible surgery tomorrow per ortho -SCDs for DVT prophylaxis as pt will likely have surgery tomorrow  -F/u  superficial and deep aerobic cultures with gram stain  Metabolic encephalopathy  Likely 2/2 DKA On exam pt was increasing confused throughout the evening. Initially ANO  x 4 but appeared muddled and confused, repeating our questions back to [place]. She denies illicit drug use. She has a history of heavy alcohol use, but denies any alcohol for 2 years.  On exam: pupils were equal 57mm bilaterally, but not responsive to light. CT head: negative so low suspicion for CVA. -Continue treatment for DKA as above -Neurochecks every 2 hourly -f/u UDS, ETOH, ammonia level   AKI in the setting of DKA Creatinine of 3.80 on admission. Baseline creatinine 0.8-0.9. AKI most likely due to dehydration secondary to DKA. S/p 3 L normal saline in ER. Also received LR mIVF which have now stopped per CCM recs. -Continue normal Saline with 14meq K at 250cc/hr -Monitor BMP -Avoid nephrotoxic agents -Renal dosing of medications   Prolonged QT EKG: QT 501 sec -Monitor QT -Avoid QT prolonging medications   Bipolar Depression  Patient is on home medication of Latuda $RemoveB'40mg'slvkdtmL$  daily -Hold Latuda as NPO and prolonged QT   HTN Patient's blood pressure has been stable since admission.  Blood pressure: 139/86. Chlorthalidone recently started by PCP but not on med rec -Monitor BP  HLD  From most recent lipid panel: HDL of 37, total cholesterol 98, LDL 108, triglycerides 117. Patient's home medication includes 10 mg of rosuvastatin. -Hold as NPO   Hyperbilirubinemia Bilirubin 2.1. Her baseline is usually wnl. Could be 2/2 acute infection. Her other liver function tests were within normal limits which is reassuring.  Patient has a history of alcohol use but denies ETOH intake in 2 years.  Physical exam was positive for possible scleral icterus with RUQ pain. -Continue to monitor with CMP  -Consider RUQ US/hepatic functional panel if abdominal pain/hyperbilirubinemia persists   Chronic pancreatitis  Used to drink ETOH  heavily 1-2 years ago. Last drink was 2  years ago. Home meds: Pancrelipase 1 capusle TID with meals -Hold pancrelipase as patient is NPO  Chronic diastolic heart failure RBBB  Complete heart block  Permanent pacemaker Last echo on 02/10/2020: EF 40-45%, mild MR. Follows Dr Shonna Chock for pacemaker.  H/o tobacco use disorder Ex smoker, stopped smoking 1 year ago. Per chart review has smoked half a pakc a day  for more than 20 years.   FEN/GI: N.p.o., N.saline with 24meq KCL 250cc/hr Prophylaxis: SCDs  Disposition: Progressive  History of Present Illness:  Sarah Long is a 50 y.o. female presenting with vomiting and diarrhea.   Pt reports she was not feeling "so good". Symptoms started 1-2 days ago. Last well 2 days ago. Her main symptoms were fatigue, dizzy, vomiting x 2 times, reduced appetite, polydipsia, diarrhea x 2.  Denies hemoptysis. Daughter was concerned so she called 911. Denies sick contacts.   Reports her CBGs have been very high recently. CBGs at home 500. She sometimes forgets to take medications. She rarely checks her CBGs. She takes novolog and basaglar 42 units. She does not remember when she last took insulin. Referred by endocrine by PCP. Reports bilateral foot infection 1  week ago. Denies purulent drainage. Under wound care for 1 week. Has a wound specialist but does not know the name. Vascular procedure yesterday which went well. Pain is 1/10 severity. Denies fevers or chills.   Pt is homeless and lives in [place] room. Works as a Research scientist (physical sciences) at [employer].  She has 2 children-son and daughter.   Ex smoker, stopped smoking 1 year ago. Per chart review has smoked half a pakc a day  for more than 20 years. Denies illicit drug use. Denies ETOH use.     Review Of Systems: Per HPI with the following additions:   Review of Systems   Patient Active Problem List   Diagnosis Date Noted   Diabetic ketoacidosis ([place]) 06/25/2021   Gangrene of toe of left foot ([place]) 30/16/0109    Metabolic encephalopathy 32/35/5732   Diabetic foot ulcer ([place]) 05/26/2021   Bacterial vaginosis 07/17/2020   Dental caries 04/01/2020   Cracked tooth 04/01/2020   Alcohol use 03/11/2020   Vitamin D deficiency 03/11/2020   AKI (acute kidney injury) ([place]) 03/11/2020   Prolonged QT interval 02/26/2020   Chronic combined systolic and diastolic CHF (congestive heart failure) ([place]) 02/26/2020   Heart block AV complete ([place]) 02/11/2020   Exocrine pancreatic insufficiency    Dysmenorrhea 10/02/2019   DM type 2 with diabetic peripheral neuropathy ([place]) 05/30/2019   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 block) 05/29/2019   Bilateral bunions 05/29/2019   Diabetes type 2, uncontrolled ([place]) 10/21/2016   Smoking 11/26/2013   Hypercholesteremia 02/02/2007   Bipolar 1 disorder ([place]) 02/02/2007   HYPERTENSION, BENIGN SYSTEMIC 02/02/2007    Past Medical History: Past Medical History:  Diagnosis Date   Abnormal Pap smear 1998   Abnormal vaginal bleeding 12/19/2011   Bacterial infection    Bipolar 1 disorder ([place])    Blister of second toe of left foot 11/21/2016   [place] vaginitis 07/2007   Depression    recently added wellbutrin-has not taken yet for bipolar   Diabetes in pregnancy    Diabetes mellitus    nph 20U qam and qpm, regular with meals   Diabetic ketoacidosis without coma associated with type 2 diabetes mellitus ([place])    Fibroid    Galactorrhea of right breast 2008   H/O amenorrhea 07/2007   H/O dizziness 10/14/11   H/O dysmenorrhea 2010   H/O menorrhagia 10/14/11   H/O varicella    Headache(784.0)    Heavy vaginal bleeding due to contraceptive injection use 10/12/2011   Depo Provera   Herpes    HSV-2 infection 01/03/2009   Hx: UTI (urinary tract infection) 2009   Hypertension    on aldomet   Increased BMI 2010   Irregular uterine bleeding 04/04/2012   Pt has mirena    Obesity 10/14/11   Oligomenorrhea 07/2007   Pelvic pain in female    Preterm labor     Trichomonas    Yeast infection     Past Surgical History: Past Surgical History:  Procedure Laterality Date   ABDOMINAL AORTOGRAM W/LOWER EXTREMITY N/A 06/17/2021   Procedure: ABDOMINAL AORTOGRAM W/LOWER EXTREMITY;  Surgeon: Wellington Hampshire, MD;  Location: [facility];  Service: Cardiovascular;  Laterality: N/A;   CESAREAN SECTION  1991   LEFT HEART CATH AND CORONARY ANGIOGRAPHY N/A 02/10/2020   Procedure: LEFT HEART CATH AND CORONARY ANGIOGRAPHY;  Surgeon: Troy Sine, MD;  Location: [facility];  Service: Cardiovascular;  Laterality: N/A;   PACEMAKER IMPLANT N/A 02/13/2020  Procedure: PACEMAKER IMPLANT;  Surgeon: Constance Haw, MD;  Location: [facility];  Service: Cardiovascular;  Laterality: N/A;   TEMPORARY PACEMAKER N/A 02/10/2020   Procedure: TEMPORARY PACEMAKER;  Surgeon: Troy Sine, MD;  Location: [facility];  Service: Cardiovascular;  Laterality: N/A;    Social History: Social History   Tobacco Use   Smoking status: Former    Packs/day: 0.50    Years: 20.00    Pack years: 10.00    Types: Cigarettes   Smokeless tobacco: Never  Vaping Use   Vaping Use: Never used  Substance Use Topics   Alcohol use: No   Drug use: No   Additional social history: Patient is currently homeless/living in a motel.   Family History: Family History  Problem Relation Age of Onset   Hypertension Mother    Diabetes Mother    Heart disease Mother    Hypertension Father    Diabetes Father    Heart disease Father    Stroke Maternal Grandfather    Other Neg Hx     Allergies and Medications: Allergies  Allergen Reactions   Strawberry Extract Hives   Sulfa Antibiotics Hives and Itching   No current facility-administered medications on file prior to encounter.   Current Outpatient Medications on File Prior to Encounter  Medication Sig Dispense Refill   Accu-Chek Softclix Lancets lancets Use as instructed to check blood sugar three times  daily. E11.65 100 each 6   acetaminophen (TYLENOL) 650 MG CR tablet Take 1,950 mg by mouth daily as needed for pain.     Blood Glucose Monitoring Suppl (ACCU-CHEK GUIDE) w/Device KIT Use as directed daily 1 kit 0   chlorthalidone (HYGROTON) 25 MG tablet Take 1 tablet (25 mg total) by mouth daily. 90 tablet 1   gabapentin (NEURONTIN) 300 MG capsule Take 2 capsules (600 mg total) by mouth 3 (three) times daily. 180 capsule 3   glucose blood (ACCU-CHEK GUIDE) test strip Use as instructed to check blood sugar three times daily. E11.65 100 each 6   insulin aspart (NOVOLOG FLEXPEN) 100 UNIT/ML FlexPen Inject 10-20 Units into the skin 3 (three) times daily with meals. 10 unit with meals Sliding scale three times daily: 0-150: 0 151-200: 2 units  201-250: 4 units  251-300: 6 units  301-350: 8 units  351- 400 10 units   >400  12 units 6 mL 1   insulin glargine (LANTUS SOLOSTAR) 100 UNIT/ML Solostar Pen Inject 40 Units into the skin at bedtime. 12 mL 1   Insulin Pen Needle (B-D ULTRAFINE III SHORT PEN) 31G X 8 MM MISC use as directed to inject insulin 90 each 3   Lancet Devices (ADJUSTABLE LANCING DEVICE) MISC Inject 1 each into the skin 3 (three) times daily. 100 each 5   lurasidone (LATUDA) 40 MG TABS tablet Take 1 tablet by mouth every evening with meals to start on or after 04/30/21 (Patient taking differently: Take 40 mg by mouth every evening.) 30 tablet 1   Multiple Vitamins-Minerals (ADULT ONE DAILY GUMMIES PO) Take 1 capsule by mouth daily.     mupirocin ointment (BACTROBAN) 2 % APPLY A THIN film OF OINTMENT to the open wounds on your toes every other day x 30 days or until healed (Patient taking differently: Apply 1 application topically every other day.) 22 g 2   Pancrelipase, Lip-Prot-Amyl, (ZENPEP) 20000-63000 units CPEP Take 1 capsule by mouth 3 (three) times daily before meals. 180 capsule 4   rosuvastatin (CRESTOR)  10 MG tablet Take 1 tablet (10 mg total) by mouth daily. 90 tablet 1     Objective: BP 130/78   Pulse 81   Temp 98.3 F (36.8 C) (Oral)   Resp 18   SpO2 100%   Exam: General: Awake, lethargic appearing. Eyes: Pupils are nonreactive bilaterally, possible scleral icterus Neck: Supple, no appreciable mass Cardiovascular: RRR, No murmurs, Normal S1/S2 Respiratory: Clear breath sounds, no wheezing or crackles Gastrointestinal: positive Bowel sounds, RUQ tenderness on palpation Derm: Gangrenous left toes with edematous surround tissue and skin pealing. Rights foot show gangrene of the first three toes with no active drainage.  Neuro:  Orientated to place, year, name, president, DOB  Psych: mildly confused            Labs and Imaging: CBC BMET  Recent Labs  Lab 06/25/21 1655 06/25/21 1735 06/25/21 2232  WBC 36.6*  --   --   HGB 9.8*   < > 9.9*  HCT 31.4*   < > 29.0*  PLT 406*  --   --    < > = values in this interval not displayed.   Recent Labs  Lab 06/26/21 0030  NA 129*  K 3.6  CL 97*  CO2 17*  BUN 83*  CREATININE 3.18*  GLUCOSE 537*  CALCIUM 7.5*     EKG: PVCs, paced rhythm, SR, prolonged QT   Alen Bleacher 06/26/2021, 2:24 AM PGY-1, [place] Intern pager: [phone], text pages welcome  FPTS Upper-Level Resident Addendum   I have independently interviewed and examined the patient. I have discussed the above with the original author and agree with their documentation. My edits for correction/addition/clarification are in black. Please see also any attending notes.   Lattie Haw MD PGY-3, [place] [facility] 06/26/2021 2:30 AM  FPTS Service pager: [phone] (text pages welcome through [facility])

## 2021-06-25 NOTE — ED Notes (Signed)
Glucose 848 . MD made aware

## 2021-06-25 NOTE — ED Notes (Signed)
Attempted report 

## 2021-06-25 NOTE — ED Notes (Signed)
RN into room to assess pt. Pt vomiting on the floor. RN and tech cleaned pt up and changed her linen. Pt is A&Ox2. Alert to self and place. When asking about time and situation pt states birth date. Pt is lethargic and disoriented. Admitting made aware and states this is how she has been but to keep assessing neuro. MD placing orders for nausea and vomiting.

## 2021-06-25 NOTE — Consult Note (Addendum)
NAME:  Sarah Long MRN:  [id number] DOB:  May 17, 1971 LOS: 0 ADMISSION DATE:  06/25/2021  CONSULTATION DATE:  06/25/2021 REFERRING MD:  Dr. Lattie Haw  REASON FOR CONSULTATION:  altered mental status   Initial Pulmonary/Critical Care Consultation  Brief History   N/A  History of present illness   This 50 y.o. obese African American female is seen in consultation at the request of Dr. Lattie Haw for recommendations on further evaluation and management of altered mental status.  The patient presented to [facility] earlier today with complaints of altered mental status/confusion, hyperglycemia, and infected L toe/foot.  She was admitted with a diagnosis of diabetic ketoacidosis, but she apparently had a normal venous pH.  Labs did show elevated anion gap.  Urinalysis was not obtained.  At the time of clinical interview, the patient is awake, alert and oriented to time person and place.  She is notable sluggish with verbal response. She moves all 4 extremities and follows commands.  She is hemodynamically stable.  She has a L 2nd toes which appears putrefactive.   REVIEW OF SYSTEMS Constitutional: No weight loss. No night sweats. No fever. No chills. No fatigue. HEENT: No headaches, dysphagia, sore throat, otalgia, nasal congestion, PND CV:  No chest pain, orthopnea, PND, swelling in lower extremities, palpitations GI:  No abdominal pain, nausea, vomiting, diarrhea, change in bowel pattern, anorexia Resp: No DOE, rest dyspnea, cough, mucus, hemoptysis, wheezing  GU: Polyuria.  Urinary urgency.  No dysuria, change in color of urine.  No flank pain. MS:  Purulent malodorous discharge from L 2nd toe.  No pain.  No joint pain or swelling. No myalgias,  No decreased range of motion.  Psych:  No change in mood or affect. No memory loss. Skin: Dry skin.   Past Medical/Surgical/Social/Family History   Past Medical History:  Diagnosis Date   Abnormal Pap smear 1998    Abnormal vaginal bleeding 12/19/2011   Bacterial infection    Bipolar 1 disorder (HCC)    Blister of second toe of left foot 11/21/2016   Candida vaginitis 07/2007   Depression    recently added wellbutrin-has not taken yet for bipolar   Diabetes in pregnancy    Diabetes mellitus    nph 20U qam and qpm, regular with meals   Diabetic ketoacidosis without coma associated with type 2 diabetes mellitus ([place])    Fibroid    Galactorrhea of right breast 2008   H/O amenorrhea 07/2007   H/O dizziness 10/14/11   H/O dysmenorrhea 2010   H/O menorrhagia 10/14/11   H/O varicella    Headache(784.0)    Heavy vaginal bleeding due to contraceptive injection use 10/12/2011   Depo Provera   Herpes    HSV-2 infection 01/03/2009   Hx: UTI (urinary tract infection) 2009   Hypertension    on aldomet   Increased BMI 2010   Irregular uterine bleeding 04/04/2012   Pt has mirena    Obesity 10/14/11   Oligomenorrhea 07/2007   Pelvic pain in female    Preterm labor    Trichomonas    Yeast infection     Past Surgical History:  Procedure Laterality Date   ABDOMINAL AORTOGRAM W/LOWER EXTREMITY N/A 06/17/2021   Procedure: ABDOMINAL AORTOGRAM W/LOWER EXTREMITY;  Surgeon: Wellington Hampshire, MD;  Location: [place] [facility];  Service: Cardiovascular;  Laterality: N/A;   CESAREAN SECTION  1991   LEFT HEART CATH AND CORONARY ANGIOGRAPHY N/A 02/10/2020   Procedure: LEFT HEART  CATH AND CORONARY ANGIOGRAPHY;  Surgeon: Troy Sine, MD;  Location: [place] [facility];  Service: Cardiovascular;  Laterality: N/A;   PACEMAKER IMPLANT N/A 02/13/2020   Procedure: PACEMAKER IMPLANT;  Surgeon: Constance Haw, MD;  Location: [place] [facility];  Service: Cardiovascular;  Laterality: N/A;   TEMPORARY PACEMAKER N/A 02/10/2020   Procedure: TEMPORARY PACEMAKER;  Surgeon: Troy Sine, MD;  Location: [place] [facility];  Service: Cardiovascular;  Laterality: N/A;    Social History   Tobacco Use   Smoking status: Former     Packs/day: 0.50    Years: 20.00    Pack years: 10.00    Types: Cigarettes   Smokeless tobacco: Never  Substance Use Topics   Alcohol use: No    Family History  Problem Relation Age of Onset   Hypertension Mother    Diabetes Mother    Heart disease Mother    Hypertension Father    Diabetes Father    Heart disease Father    Stroke Maternal Grandfather    Other Neg Hx      Significant Hospital Events      Consults:  Ortho PCCM   Procedures:     Significant Diagnostic Tests:     Micro Data:   Results for orders placed or performed during the hospital encounter of 06/25/21  Resp Panel by RT-PCR (Flu A&B, Covid) Nasopharyngeal Swab     Status: None   Collection Time: 06/25/21  7:57 PM   Specimen: Nasopharyngeal Swab; Nasopharyngeal(NP) swabs in vial transport medium  Result Value Ref Range Status   SARS Coronavirus 2 by RT PCR NEGATIVE NEGATIVE Final    Comment: (NOTE) SARS-CoV-2 target nucleic acids are NOT DETECTED.  The SARS-CoV-2 RNA is generally detectable in upper respiratory specimens during the acute phase of infection. The lowest concentration of SARS-CoV-2 viral copies this assay can detect is 138 copies/mL. A negative result does not preclude SARS-Cov-2 infection and should not be used as the sole basis for treatment or other patient management decisions. A negative result may occur with  improper specimen collection/handling, submission of specimen other than nasopharyngeal swab, presence of viral mutation(s) within the areas targeted by this assay, and inadequate number of viral copies(<138 copies/mL). A negative result must be combined with clinical observations, patient history, and epidemiological information. The expected result is Negative.  Fact Sheet for Patients:  EntrepreneurPulse.com.au  Fact Sheet for Healthcare Providers:  IncredibleEmployment.be  This test is no t yet approved or cleared by  the [place] FDA and  has been authorized for detection and/or diagnosis of SARS-CoV-2 by FDA under an Emergency Use Authorization (EUA). This EUA will remain  in effect (meaning this test can be used) for the duration of the COVID-19 declaration under Section 564(b)(1) of the Act, 21 U.S.C.section 360bbb-3(b)(1), unless the authorization is terminated  or revoked sooner.       Influenza A by PCR NEGATIVE NEGATIVE Final   Influenza B by PCR NEGATIVE NEGATIVE Final    Comment: (NOTE) The Xpert Xpress SARS-CoV-2/FLU/RSV plus assay is intended as an aid in the diagnosis of influenza from Nasopharyngeal swab specimens and should not be used as a sole basis for treatment. Nasal washings and aspirates are unacceptable for Xpert Xpress SARS-CoV-2/FLU/RSV testing.  Fact Sheet for Patients: EntrepreneurPulse.com.au  Fact Sheet for Healthcare Providers: IncredibleEmployment.be  This test is not yet approved or cleared by the [place] FDA and has been authorized for detection and/or diagnosis of SARS-CoV-2 by FDA  under an Emergency Use Authorization (EUA). This EUA will remain in effect (meaning this test can be used) for the duration of the COVID-19 declaration under Section 564(b)(1) of the Act, 21 U.S.C. section 360bbb-3(b)(1), unless the authorization is terminated or revoked.  Performed at [facility], [place] [address], [place], [place] [address]       Antimicrobials:  Rocephin/clinda/vanc (7/21>>)   Interim history/subjective:  N/A   Objective   BP 106/61   Pulse 78   Temp 98.3 F (36.8 C) (Oral)   Resp (!) 25   SpO2 100%     There were no vitals filed for this visit.  Intake/Output Summary (Last 24 hours) at 06/25/2021 2232 Last data filed at 06/25/2021 2228 Gross per 24 hour  Intake 3965.13 ml  Output --  Net 3965.13 ml        Examination: GENERAL:  alert, oriented to time, person and place, morbidly  obese. No acute distress. HEAD: normocephalic, atraumatic EYE: PERRLA, EOM intact, no scleral icterus, no pallor. THROAT/ORAL CAVITY: Normal dentition. No oral thrush. No exudate. Mucous membranes are moist. No tonsillar enlargement. NECK: supple, no thyromegaly, no JVD, no lymphadenopathy. Trachea midline. CHEST/LUNG: symmetric in development and expansion. Good air entry. No crackles. No wheezes. HEART: Regular S1 and S2 without murmur, rub or gallop. ABDOMEN: soft, nontender, nondistended. Normoactive bowel sounds. No rebound. No guarding. No hepatosplenomegaly. EXTREMITIES: Putrid changes of L 2nd toe.  Edema: 2+. No cyanosis. No clubbing. 2+ DP pulses LYMPHATIC: no cervical/axillary/inguinal lymph nodes appreciated MUSCULOSKELETAL: No point tenderness. No bulk atrophy.  SKIN:  Open wound with purulent drainage at L 2nd toe. NEUROLOGIC: Doll's eyes intact. Corneal reflex intact. Spontaneous respirations intact. Cranial nerves II-XII are grossly symmetric and physiologic. Babinski absent.  Decreased sensation at the extremities. Motor: 5/5 @ RUE, 5/5 @ LUE, 5/5 @ RLL,  5/5 @ LLL.  DTR: 2+ @ R biceps, 2+ @ L biceps, 2+ @ R patellar,  2+ @ L patellar. No cerebellar signs. Gait was not assessed.   Resolved Hospital Problem list      Assessment & Plan:   ASSESSMENT/PLAN:  ASSESSMENT (included in the Hospital Problem List)  Principal Problem:   Diabetic ketoacidosis ([place]) Active Problems:   Diabetic foot ulcer ([place])   Gangrene of toe of left foot ([place])   DM type 2 with diabetic peripheral neuropathy (HCC)   Heart block AV complete (HCC)   Chronic combined systolic and diastolic CHF (congestive heart failure) ([place])   AKI (acute kidney injury) ([place])   Bipolar 1 disorder ([place])   HYPERTENSION, BENIGN SYSTEMIC   Metabolic encephalopathy  By systems: PULMONARY: No acute issues Supplemental oxygen as needed to maintain SpO2 93+%   CARDIOVASCULAR H/O complete heart block s/p pacemaker  implantation Hypertension at baseline Patient will need contrast enhanced CT of the foot to evaluate for possible necrotizing fasciitis and/or osteomyelitis.   RENAL Acute kidney injury Aggressive IV fluid hydration Monitor urine output Avoid nephrotoxic drugs Renal dosing of medications   GASTROINTESTINAL: No acute issues GI PROPHYLAXIS: Protonix   HEMATOLOGIC Leukocytosis Normocytic anemia Monitor CBC On empiric antibiotics (Rocephin/clinda/vanc) for diabetic foot DVT PROPHYLAXIS: heparin   INFECTIOUS L diabetic foot with apparent gangrenous necrosis, concerning for necrotizing fasciitis and/or osteomyelitis Continue antibiotics Orthopedic evaluation pending   ENDOCRINE Hyperosmolar state (DKA vs hyperosmolar hyperglycemic nonketosis) Aggressive IV fluid resuscitation With K < 4.5 and insulin infusion running, I would change IV fluids to NS + 40 mEq KCl.  D5 will need to  be added when glucose values fall below 250. DKA management per primary team.   NEUROLOGIC Metabolic encephalopathy related to hyperosmolar state Bipolar disorder at baseline Monitor neurologic status   PLAN/RECOMMENDATIONS  This patient does not need ICU level care at this time.  I have communicated specific recommendations to Dr. Posey Pronto by phone. Will see as needed.  Please call if needed.    My assessment, plan of care, findings, medications, side effects, etc. were discussed with: Dr. Posey Pronto (Family Medicine).   Best practice:  Diet: NPO except water Pain/Anxiety/Delirium protocol (if indicated): N/A VAP protocol (if indicated): N/A DVT prophylaxis: heparin GI prophylaxis: Protonix Glucose control: insulin infusion Mobility/Activity: bedrest   Code Status: Full Code Family Communication:  patient's family (daughter) at bedside, updated Disposition: per primary team   Labs   CBC: Recent Labs  Lab 06/25/21 1655 06/25/21 1735 06/25/21 2052  WBC 36.6*  --   --   NEUTROABS 31.6*  --    --   HGB 9.8* 11.9* 10.2*  HCT 31.4* 35.0* 30.0*  MCV 91.3  --   --   PLT 406*  --   --     Basic Metabolic Panel: Recent Labs  Lab 06/25/21 1655 06/25/21 1735 06/25/21 2052 06/25/21 2121  NA 123* 123* 130* 127*  K 4.6 4.2 3.7 3.8  CL 89* 92*  --  93*  CO2 12*  --   --  14*  GLUCOSE 848* >700*  --  699*  BUN 86* 72*  --  82*  CREATININE 3.80* 3.20*  --  3.46*  CALCIUM 7.9*  --   --  7.5*   GFR: Estimated Creatinine Clearance: 22.1 mL/min (A) (by C-G formula based on SCr of 3.46 mg/dL (H)). Recent Labs  Lab 06/25/21 1655 06/25/21 1700 06/25/21 2057  WBC 36.6*  --   --   LATICACIDVEN  --  3.4* 2.5*    Liver Function Tests: Recent Labs  Lab 06/25/21 1655  AST 23  ALT 20  ALKPHOS 101  BILITOT 2.1*  PROT 7.6  ALBUMIN 1.5*   No results for input(s): LIPASE, AMYLASE in the last 168 hours. No results for input(s): AMMONIA in the last 168 hours.  ABG    Component Value Date/Time   PHART 7.296 (L) 02/10/2020 1213   PCO2ART 36.7 02/10/2020 1213   PO2ART 96.0 02/10/2020 1213   HCO3 13.7 (L) 06/25/2021 2052   TCO2 14 (L) 06/25/2021 2052   ACIDBASEDEF 10.0 (H) 06/25/2021 2052   O2SAT 96.0 06/25/2021 2052     Coagulation Profile: Recent Labs  Lab 06/25/21 1655  INR 1.5*    Cardiac Enzymes: No results for input(s): CKTOTAL, CKMB, CKMBINDEX, TROPONINI in the last 168 hours.  HbA1C: Hemoglobin A1C  Date/Time Value Ref Range Status  05/15/2020 10:08 AM 8.4 (A) 4.0 - 5.6 % Final   HbA1c, POC (controlled diabetic range)  Date/Time Value Ref Range Status  06/18/2021 10:01 AM 13.6 (A) 0.0 - 7.0 % Final   Hgb A1c MFr Bld  Date/Time Value Ref Range Status  02/11/2020 04:36 AM 12.7 (H) 4.8 - 5.6 % Final    Comment:    (NOTE) Pre diabetes:          5.7%-6.4% Diabetes:              >6.4% Glycemic control for   <7.0% adults with diabetes   02/10/2020 08:19 PM 12.9 (H) 4.8 - 5.6 % Final    Comment:    (NOTE) Pre diabetes:  5.7%-6.4% Diabetes:               >6.4% Glycemic control for   <7.0% adults with diabetes     CBG: Recent Labs  Lab 06/25/21 1920 06/25/21 1952 06/25/21 2029 06/25/21 2049 06/25/21 2223  GLUCAP >600* >600* >600* >600* >600*     Review of Systems:   See above   Past Medical History   Past Medical History:  Diagnosis Date   Abnormal Pap smear 1998   Abnormal vaginal bleeding 12/19/2011   Bacterial infection    Bipolar 1 disorder ([place])    Blister of second toe of left foot 11/21/2016   Candida vaginitis 07/2007   Depression    recently added wellbutrin-has not taken yet for bipolar   Diabetes in pregnancy    Diabetes mellitus    nph 20U qam and qpm, regular with meals   Diabetic ketoacidosis without coma associated with type 2 diabetes mellitus ([place])    Fibroid    Galactorrhea of right breast 2008   H/O amenorrhea 07/2007   H/O dizziness 10/14/11   H/O dysmenorrhea 2010   H/O menorrhagia 10/14/11   H/O varicella    Headache(784.0)    Heavy vaginal bleeding due to contraceptive injection use 10/12/2011   Depo Provera   Herpes    HSV-2 infection 01/03/2009   Hx: UTI (urinary tract infection) 2009   Hypertension    on aldomet   Increased BMI 2010   Irregular uterine bleeding 04/04/2012   Pt has mirena    Obesity 10/14/11   Oligomenorrhea 07/2007   Pelvic pain in female    Preterm labor    Trichomonas    Yeast infection       Surgical History    Past Surgical History:  Procedure Laterality Date   ABDOMINAL AORTOGRAM W/LOWER EXTREMITY N/A 06/17/2021   Procedure: ABDOMINAL AORTOGRAM W/LOWER EXTREMITY;  Surgeon: Wellington Hampshire, MD;  Location: [facility];  Service: Cardiovascular;  Laterality: N/A;   CESAREAN SECTION  1991   LEFT HEART CATH AND CORONARY ANGIOGRAPHY N/A 02/10/2020   Procedure: LEFT HEART CATH AND CORONARY ANGIOGRAPHY;  Surgeon: Troy Sine, MD;  Location: [facility];  Service: Cardiovascular;  Laterality: N/A;   PACEMAKER IMPLANT N/A 02/13/2020    Procedure: PACEMAKER IMPLANT;  Surgeon: Constance Haw, MD;  Location: [facility];  Service: Cardiovascular;  Laterality: N/A;   TEMPORARY PACEMAKER N/A 02/10/2020   Procedure: TEMPORARY PACEMAKER;  Surgeon: Troy Sine, MD;  Location: [facility];  Service: Cardiovascular;  Laterality: N/A;      Social History   Social History   Socioeconomic History   Marital status: Married    Spouse name: Not on file   Number of children: Not on file   Years of education: Not on file   Highest education level: Not on file  Occupational History   Not on file  Tobacco Use   Smoking status: Former    Packs/day: 0.50    Years: 20.00    Pack years: 10.00    Types: Cigarettes   Smokeless tobacco: Never  Vaping Use   Vaping Use: Never used  Substance and Sexual Activity   Alcohol use: No   Drug use: No   Sexual activity: Yes    Birth control/protection: None  Other Topics Concern   Not on file  Social History Narrative   Not on file   Social Determinants of Health   Financial Resource Strain:  Not on file  Food Insecurity: Not on file  Transportation Needs: Not on file  Physical Activity: Not on file  Stress: Not on file  Social Connections: Not on file      Family History    Family History  Problem Relation Age of Onset   Hypertension Mother    Diabetes Mother    Heart disease Mother    Hypertension Father    Diabetes Father    Heart disease Father    Stroke Maternal Grandfather    Other Neg Hx    family history includes Diabetes in her father and mother; Heart disease in her father and mother; Hypertension in her father and mother; Stroke in her maternal grandfather. There is no history of Other.    Allergies Allergies  Allergen Reactions   Strawberry Extract Hives   Sulfa Antibiotics Hives and Itching      Current Medications  Current Facility-Administered Medications:    0.9 % NaCl with KCl 40 mEq / L  infusion, , Intravenous,  Continuous, Ezequiel Essex, MD, Last Rate: 250 mL/hr at 06/25/21 2222, New Bag at 06/25/21 2222   ceFEPIme (MAXIPIME) 2 g in sodium chloride 0.9 % 100 mL IVPB, 2 g, Intravenous, Q24H, Patel, Poonam, MD   clindamycin (CLEOCIN) IVPB 600 mg, 600 mg, Intravenous, Q8H, Patel, Poonam, MD   dextrose 50 % solution 0-50 mL, 0-50 mL, Intravenous, PRN, Posey Pronto, Poonam, MD   insulin regular, human (MYXREDLIN) 100 units/ 100 mL infusion, , Intravenous, Continuous, Patel, Poonam, MD, Last Rate: 11.5 mL/hr at 06/25/21 2042, 11.5 Units/hr at 06/25/21 2042   [START ON 06/26/2021] rosuvastatin (CRESTOR) tablet 10 mg, 10 mg, Oral, Daily, Patel, Poonam, MD   vancomycin variable dose per unstable renal function (pharmacist dosing), , Does not apply, See admin instructions, Lattie Haw, MD  Current Outpatient Medications:    Accu-Chek Softclix Lancets lancets, Use as instructed to check blood sugar three times daily. E11.65, Disp: 100 each, Rfl: 6   acetaminophen (TYLENOL) 650 MG CR tablet, Take 1,950 mg by mouth daily as needed for pain., Disp: , Rfl:    Blood Glucose Monitoring Suppl (ACCU-CHEK GUIDE) w/Device KIT, Use as directed daily, Disp: 1 kit, Rfl: 0   chlorthalidone (HYGROTON) 25 MG tablet, Take 1 tablet (25 mg total) by mouth daily., Disp: 90 tablet, Rfl: 1   gabapentin (NEURONTIN) 300 MG capsule, Take 2 capsules (600 mg total) by mouth 3 (three) times daily., Disp: 180 capsule, Rfl: 3   glucose blood (ACCU-CHEK GUIDE) test strip, Use as instructed to check blood sugar three times daily. E11.65, Disp: 100 each, Rfl: 6   insulin aspart (NOVOLOG FLEXPEN) 100 UNIT/ML FlexPen, Inject 10-20 Units into the skin 3 (three) times daily with meals. 10 unit with meals Sliding scale three times daily: 0-150: 0 151-200: 2 units  201-250: 4 units  251-300: 6 units  301-350: 8 units  351- 400 10 units   >400  12 units, Disp: 6 mL, Rfl: 1   insulin glargine (LANTUS SOLOSTAR) 100 UNIT/ML Solostar Pen, Inject 40 Units into the  skin at bedtime., Disp: 12 mL, Rfl: 1   Insulin Pen Needle (B-D ULTRAFINE III SHORT PEN) 31G X 8 MM MISC, use as directed to inject insulin, Disp: 90 each, Rfl: 3   Lancet Devices (ADJUSTABLE LANCING DEVICE) MISC, Inject 1 each into the skin 3 (three) times daily., Disp: 100 each, Rfl: 5   lurasidone (LATUDA) 40 MG TABS tablet, Take 1 tablet by mouth every evening with meals  to start on or after 04/30/21 (Patient taking differently: Take 40 mg by mouth every evening.), Disp: 30 tablet, Rfl: 1   Multiple Vitamins-Minerals (ADULT ONE DAILY GUMMIES PO), Take 1 capsule by mouth daily., Disp: , Rfl:    mupirocin ointment (BACTROBAN) 2 %, APPLY A THIN film OF OINTMENT to the open wounds on your toes every other day x 30 days or until healed (Patient taking differently: Apply 1 application topically every other day.), Disp: 22 g, Rfl: 2   Pancrelipase, Lip-Prot-Amyl, (ZENPEP) 20000-63000 units CPEP, Take 1 capsule by mouth 3 (three) times daily before meals., Disp: 180 capsule, Rfl: 4   rosuvastatin (CRESTOR) 10 MG tablet, Take 1 tablet (10 mg total) by mouth daily., Disp: 90 tablet, Rfl: 1  Home Medications  Prior to Admission medications   Medication Sig Start Date End Date Taking? Authorizing Provider  Accu-Chek Softclix Lancets lancets Use as instructed to check blood sugar three times daily. E11.65 06/18/21  Yes [name], MD  acetaminophen (TYLENOL) 650 MG CR tablet Take 1,950 mg by mouth daily as needed for pain.   Yes [provider]  Blood Glucose Monitoring Suppl (ACCU-CHEK GUIDE) w/Device KIT Use as directed daily 05/26/21  Yes Fenton Foy, NP  chlorthalidone (HYGROTON) 25 MG tablet Take 1 tablet (25 mg total) by mouth daily. 06/18/21  Yes [name], MD  gabapentin (NEURONTIN) 300 MG capsule Take 2 capsules (600 mg total) by mouth 3 (three) times daily. 06/18/21  Yes [name], MD  glucose blood (ACCU-CHEK GUIDE) test strip Use as instructed to check blood sugar  three times daily. E11.65 06/18/21  Yes [name], MD  insulin aspart (NOVOLOG FLEXPEN) 100 UNIT/ML FlexPen Inject 10-20 Units into the skin 3 (three) times daily with meals. 10 unit with meals Sliding scale three times daily: 0-150: 0 151-200: 2 units  201-250: 4 units  251-300: 6 units  301-350: 8 units  351- 400 10 units   >400  12 units 06/18/21  Yes [name], MD  insulin glargine (LANTUS SOLOSTAR) 100 UNIT/ML Solostar Pen Inject 40 Units into the skin at bedtime. 06/18/21  Yes [name], MD  Insulin Pen Needle (B-D ULTRAFINE III SHORT PEN) 31G X 8 MM MISC use as directed to inject insulin 06/18/21  Yes [name], MD  Lancet Devices (ADJUSTABLE LANCING DEVICE) MISC Inject 1 each into the skin 3 (three) times daily. 05/26/21  Yes Fenton Foy, NP  lurasidone (LATUDA) 40 MG TABS tablet Take 1 tablet by mouth every evening with meals to start on or after 04/30/21 Patient taking differently: Take 40 mg by mouth every evening. 04/16/21  Yes   Multiple Vitamins-Minerals (ADULT ONE DAILY GUMMIES PO) Take 1 capsule by mouth daily.   Yes [provider]  mupirocin ointment (BACTROBAN) 2 % APPLY A THIN film OF OINTMENT to the open wounds on your toes every other day x 30 days or until healed Patient taking differently: Apply 1 application topically every other day. 05/27/21  Yes   Pancrelipase, Lip-Prot-Amyl, (ZENPEP) 20000-63000 units CPEP Take 1 capsule by mouth 3 (three) times daily before meals. 06/18/21  Yes [name], MD  rosuvastatin (CRESTOR) 10 MG tablet Take 1 tablet (10 mg total) by mouth daily. 06/18/21 09/16/21 Yes [name], MD     Renee Pain, MD Board Certified by the ABIM, [place]

## 2021-06-25 NOTE — ED Triage Notes (Signed)
Pt arrived via [facility] from home. Per EMS pt c/o generalized weakness and dizziness while standing x4 days with n/v x2 days. EMS reports necrosis in feet bilateral with unknown time and that pt stated to them non compliant with meds. Pt denied pain with EMS.   EMS VS 128/80 BP 80 HR 100% RA  CBG 'high"  NS 500 NS bolus via 22 g IV R. hand

## 2021-06-25 NOTE — ED Provider Notes (Signed)
[facility] EMERGENCY DEPARTMENT Provider Note   CSN: [id number] Arrival date & time: 06/25/21  1639     History Chief Complaint  Patient presents with   Hyperglycemia    [name] is a 50 y.o. female.  Patient states that she has been thrown up for a few days.  She has a history of diabetes.  She also has a history of infected left foot that is been take care for a number days.  The history is provided by the patient and medical records. No language interpreter was used.  Hyperglycemia Severity:  Severe Onset quality:  Sudden Duration:  1 day Timing:  Constant Progression:  Waxing and waning Chronicity:  New Diabetes status:  Controlled with insulin Context: not change in medication   Relieved by:  Nothing Ineffective treatments:  None tried Associated symptoms: vomiting   Associated symptoms: no abdominal pain, no chest pain and no fatigue       Past Medical History:  Diagnosis Date   Abnormal Pap smear 1998   Abnormal vaginal bleeding 12/19/2011   Bacterial infection    Bipolar 1 disorder ([place])    Blister of second toe of left foot 11/21/2016   Candida vaginitis 07/2007   Depression    recently added wellbutrin-has not taken yet for bipolar   Diabetes in pregnancy    Diabetes mellitus    nph 20U qam and qpm, regular with meals   Diabetic ketoacidosis without coma associated with type 2 diabetes mellitus ([place])    Fibroid    Galactorrhea of right breast 2008   H/O amenorrhea 07/2007   H/O dizziness 10/14/11   H/O dysmenorrhea 2010   H/O menorrhagia 10/14/11   H/O varicella    Headache(784.0)    Heavy vaginal bleeding due to contraceptive injection use 10/12/2011   Depo Provera   Herpes    HSV-2 infection 01/03/2009   Hx: UTI (urinary tract infection) 2009   Hypertension    on aldomet   Increased BMI 2010   Irregular uterine bleeding 04/04/2012   Pt has mirena    Obesity 10/14/11   Oligomenorrhea 07/2007   Pelvic pain in female     Preterm labor    Trichomonas    Yeast infection     Patient Active Problem List   Diagnosis Date Noted   Diabetic foot ulcer ([place]) 05/26/2021   Bacterial vaginosis 07/17/2020   Dental caries 04/01/2020   Cracked tooth 04/01/2020   Alcohol use 03/11/2020   Vitamin D deficiency 03/11/2020   Prolonged QT interval 02/26/2020   Chronic combined systolic and diastolic CHF (congestive heart failure) ([place]) 02/26/2020   Heart block AV complete ([place]) 02/11/2020   Exocrine pancreatic insufficiency    Dysmenorrhea 10/02/2019   DM type 2 with diabetic peripheral neuropathy ([place]) 05/30/2019   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 block) 05/29/2019   Bilateral bunions 05/29/2019   Diabetes type 2, uncontrolled ([place]) 10/21/2016   Smoking 11/26/2013   Hypercholesteremia 02/02/2007   Bipolar 1 disorder ([place]) 02/02/2007   HYPERTENSION, BENIGN SYSTEMIC 02/02/2007    Past Surgical History:  Procedure Laterality Date   ABDOMINAL AORTOGRAM W/LOWER EXTREMITY N/A 06/17/2021   Procedure: ABDOMINAL AORTOGRAM W/LOWER EXTREMITY;  Surgeon: Wellington Hampshire, MD;  Location: [place] [facility];  Service: Cardiovascular;  Laterality: N/A;   CESAREAN SECTION  1991   LEFT HEART CATH AND CORONARY ANGIOGRAPHY N/A 02/10/2020   Procedure: LEFT HEART CATH AND CORONARY ANGIOGRAPHY;  Surgeon: Shelva Majestic  A, MD;  Location: [place] [facility];  Service: Cardiovascular;  Laterality: N/A;   PACEMAKER IMPLANT N/A 02/13/2020   Procedure: PACEMAKER IMPLANT;  Surgeon: Constance Haw, MD;  Location: [place] [facility];  Service: Cardiovascular;  Laterality: N/A;   TEMPORARY PACEMAKER N/A 02/10/2020   Procedure: TEMPORARY PACEMAKER;  Surgeon: Troy Sine, MD;  Location: [place] [facility];  Service: Cardiovascular;  Laterality: N/A;     OB History     Gravida  6   Para  4   Term  1   Preterm  3   AB  2   Living  4      SAB  1   IAB  1   Ectopic  0   Multiple  0   Live  Births  3           Family History  Problem Relation Age of Onset   Hypertension Mother    Diabetes Mother    Heart disease Mother    Hypertension Father    Diabetes Father    Heart disease Father    Stroke Maternal Grandfather    Other Neg Hx     Social History   Tobacco Use   Smoking status: Former    Packs/day: 0.50    Years: 20.00    Pack years: 10.00    Types: Cigarettes   Smokeless tobacco: Never  Vaping Use   Vaping Use: Never used  Substance Use Topics   Alcohol use: No   Drug use: No    Home Medications Prior to Admission medications   Medication Sig Start Date End Date Taking? Authorizing Provider  Accu-Chek Softclix Lancets lancets Use as instructed to check blood sugar three times daily. E11.65 06/18/21  Yes [name], MD  acetaminophen (TYLENOL) 650 MG CR tablet Take 1,950 mg by mouth daily as needed for pain.   Yes [provider]  Blood Glucose Monitoring Suppl (ACCU-CHEK GUIDE) w/Device KIT Use as directed daily 05/26/21  Yes Fenton Foy, NP  chlorthalidone (HYGROTON) 25 MG tablet Take 1 tablet (25 mg total) by mouth daily. 06/18/21  Yes [name], MD  gabapentin (NEURONTIN) 300 MG capsule Take 2 capsules (600 mg total) by mouth 3 (three) times daily. 06/18/21  Yes [name], MD  glucose blood (ACCU-CHEK GUIDE) test strip Use as instructed to check blood sugar three times daily. E11.65 06/18/21  Yes [name], MD  insulin aspart (NOVOLOG FLEXPEN) 100 UNIT/ML FlexPen Inject 10-20 Units into the skin 3 (three) times daily with meals. 10 unit with meals Sliding scale three times daily: 0-150: 0 151-200: 2 units  201-250: 4 units  251-300: 6 units  301-350: 8 units  351- 400 10 units   >400  12 units 06/18/21  Yes [name], MD  insulin glargine (LANTUS SOLOSTAR) 100 UNIT/ML Solostar Pen Inject 40 Units into the skin at bedtime. 06/18/21  Yes [name], MD  Insulin Pen Needle (B-D ULTRAFINE III SHORT PEN)  31G X 8 MM MISC use as directed to inject insulin 06/18/21  Yes [name], MD  Lancet Devices (ADJUSTABLE LANCING DEVICE) MISC Inject 1 each into the skin 3 (three) times daily. 05/26/21  Yes Fenton Foy, NP  lurasidone (LATUDA) 40 MG TABS tablet Take 1 tablet by mouth every evening with meals to start on or after 04/30/21 Patient taking differently: Take 40 mg by mouth every evening. 04/16/21  Yes  Multiple Vitamins-Minerals (ADULT ONE DAILY GUMMIES PO) Take 1 capsule by mouth daily.   Yes [provider]  mupirocin ointment (BACTROBAN) 2 % APPLY A THIN film OF OINTMENT to the open wounds on your toes every other day x 30 days or until healed Patient taking differently: Apply 1 application topically every other day. 05/27/21  Yes   Pancrelipase, Lip-Prot-Amyl, (ZENPEP) 20000-63000 units CPEP Take 1 capsule by mouth 3 (three) times daily before meals. 06/18/21  Yes [name], MD  rosuvastatin (CRESTOR) 10 MG tablet Take 1 tablet (10 mg total) by mouth daily. 06/18/21 09/16/21 Yes [name], MD    Allergies    Strawberry extract and Sulfa antibiotics  Review of Systems   Review of Systems  Constitutional:  Negative for appetite change and fatigue.  HENT:  Negative for congestion, ear discharge and sinus pressure.   Eyes:  Negative for discharge.  Respiratory:  Negative for cough.   Cardiovascular:  Negative for chest pain.  Gastrointestinal:  Positive for vomiting. Negative for abdominal pain and diarrhea.  Genitourinary:  Negative for frequency and hematuria.  Musculoskeletal:  Negative for back pain.  Skin:  Negative for rash.  Neurological:  Negative for seizures and headaches.  Psychiatric/Behavioral:  Negative for hallucinations.    Physical Exam Updated Vital Signs BP 140/77   Pulse 81   Resp (!) 24   SpO2 100%   Physical Exam Vitals and nursing note reviewed.  Constitutional:      Appearance: She is well-developed.  HENT:     Head:  Normocephalic.     Nose: Nose normal.  Eyes:     General: No scleral icterus.    Conjunctiva/sclera: Conjunctivae normal.  Neck:     Thyroid: No thyromegaly.  Cardiovascular:     Rate and Rhythm: Normal rate and regular rhythm.     Heart sounds: No murmur heard.   No friction rub. No gallop.  Pulmonary:     Breath sounds: No stridor. No wheezing or rales.  Chest:     Chest wall: No tenderness.  Abdominal:     General: There is no distension.     Tenderness: There is no abdominal tenderness. There is no rebound.  Musculoskeletal:        General: Normal range of motion.     Cervical back: Neck supple.     Comments: Patient with gangrenous looking left distal foot  Lymphadenopathy:     Cervical: No cervical adenopathy.  Skin:    Findings: No erythema or rash.  Neurological:     Mental Status: She is alert and oriented to person, place, and time.     Motor: No abnormal muscle tone.     Coordination: Coordination normal.  Psychiatric:        Behavior: Behavior normal.    ED Results / Procedures / Treatments   Labs (all labs ordered are listed, but only abnormal results are displayed) Labs Reviewed  CBC WITH DIFFERENTIAL/PLATELET - Abnormal; Notable for the following components:      Result Value   WBC 36.6 (*)    RBC 3.44 (*)    Hemoglobin 9.8 (*)    HCT 31.4 (*)    RDW 15.6 (*)    Platelets 406 (*)    Neutro Abs 31.6 (*)    Monocytes Absolute 1.1 (*)    Abs Immature Granulocytes 1.76 (*)    All other components within normal limits  LACTIC ACID, PLASMA - Abnormal; Notable for the following components:  Lactic Acid, Venous 3.4 (*)    All other components within normal limits  PROTIME-INR - Abnormal; Notable for the following components:   Prothrombin Time 17.9 (*)    INR 1.5 (*)    All other components within normal limits  I-STAT CHEM 8, ED - Abnormal; Notable for the following components:   Sodium 123 (*)    Chloride 92 (*)    BUN 72 (*)    Creatinine, Ser  3.20 (*)    Glucose, Bld >700 (*)    Calcium, Ion 0.81 (*)    TCO2 14 (*)    Hemoglobin 11.9 (*)    HCT 35.0 (*)    All other components within normal limits  CBG MONITORING, ED - Abnormal; Notable for the following components:   Glucose-Capillary >600 (*)    All other components within normal limits  CBG MONITORING, ED - Abnormal; Notable for the following components:   Glucose-Capillary >600 (*)    All other components within normal limits  CBG MONITORING, ED - Abnormal; Notable for the following components:   Glucose-Capillary >600 (*)    All other components within normal limits  RESP PANEL BY RT-PCR (FLU A&B, COVID) ARPGX2  CULTURE, BLOOD (ROUTINE X 2)  CULTURE, BLOOD (ROUTINE X 2)  APTT  COMPREHENSIVE METABOLIC PANEL  BLOOD GAS, VENOUS  LACTIC ACID, PLASMA  BETA-HYDROXYBUTYRIC ACID  BETA-HYDROXYBUTYRIC ACID  I-STAT BETA HCG BLOOD, ED ([facility], [facility], [facility] ONLY)    EKG EKG Interpretation  Date/Time:  Thursday June 25 2021 17:33:22 EDT Ventricular Rate:  81 PR Interval:  171 QRS Duration: 182 QT Interval:  498 QTC Calculation: 579 R Axis:   206 Text Interpretation: Sinus rhythm Probable left atrial enlargement Nonspecific intraventricular conduction delay Probable inferior infarct, acute Lateral leads are also involved Confirmed by Milton Ferguson [phone]) on 06/25/2021 5:37:51 PM  Radiology DG Chest Port 1 View  Result Date: 06/25/2021 CLINICAL DATA:  Sepsis, hyperglycemia EXAM: PORTABLE CHEST 1 VIEW COMPARISON:  03/29/2021 FINDINGS: Single frontal view of the chest demonstrates stable dual lead pacer. Cardiac silhouette is enlarged but stable. No airspace disease, effusion, or pneumothorax. No acute bony abnormalities. IMPRESSION: 1. No acute intrathoracic process. Electronically Signed   By: Randa Ngo M.D.   On: 06/25/2021 18:20   DG Foot Complete Left  Result Date: 06/25/2021 CLINICAL DATA:  Hyperglycemia, question sepsis. Gangrenous left foot. EXAM: LEFT FOOT -  COMPLETE 3+ VIEW COMPARISON:  None. FINDINGS: No cortical erosion or destruction. There is no evidence of fracture or dislocation. There is no evidence of arthropathy or other focal bone abnormality. Diffuse marked subcutaneus soft tissue edema and emphysema. Vascular calcifications. IMPRESSION: 1. Diffuse marked subcutaneus soft tissue edema and emphysema. Correlate clinically for necrotizing fasciitis. 2. No radiographic findings to suggest osteomyelitis. 3.  No acute displaced fracture or dislocation. Electronically Signed   By: Iven Finn M.D.   On: 06/25/2021 18:20    Procedures Procedures   Medications Ordered in ED Medications  vancomycin (VANCOREADY) IVPB 1750 mg/350 mL (1,750 mg Intravenous New Bag/Given 06/25/21 1757)  cefTRIAXone (ROCEPHIN) 2 g in sodium chloride 0.9 % 100 mL IVPB (has no administration in time range)  insulin regular, human (MYXREDLIN) 100 units/ 100 mL infusion (11.5 Units/hr Intravenous New Bag/Given 06/25/21 1834)  lactated ringers infusion (has no administration in time range)  dextrose 5 % in lactated ringers infusion (has no administration in time range)  dextrose 50 % solution 0-50 mL (has no administration in time range)  potassium chloride 10  mEq in 100 mL IVPB (has no administration in time range)  vancomycin variable dose per unstable renal function (pharmacist dosing) (has no administration in time range)  sodium chloride 0.9 % bolus 1,000 mL (has no administration in time range)  sodium chloride 0.9 % bolus 2,000 mL (2,000 mLs Intravenous New Bag/Given 06/25/21 1730)    ED Course  I have reviewed the triage vital signs and the nursing notes.  Pertinent labs & imaging results that were available during my care of the patient were reviewed by me and considered in my medical decision making (see chart for details). CRITICAL CARE Performed by: Milton Ferguson Total critical care time: 4mnutes Critical care time was exclusive of separately billable  procedures and treating other patients. Critical care was necessary to treat or prevent imminent or life-threatening deterioration. Critical care was time spent personally by me on the following activities: development of treatment plan with patient and/or surrogate as well as nursing, discussions with consultants, evaluation of patient's response to treatment, examination of patient, obtaining history from patient or surrogate, ordering and performing treatments and interventions, ordering and review of laboratory studies, ordering and review of radiographic studies, pulse oximetry and re-evaluation of patient's condition.  Patient in DKA and has gangrenous left foot.  She is cultured and started on antibiotics along with given 3 L of fluids and started on an insulin drip.  I spoke with critical care and they felt like medicine could take care of the patient and critical care can be consulted if necessary.  I also spoke with orthopedics and they are recommending getting an MRI and they will follow the patient MDM Rules/Calculators/A&P                           Patient with significant DKA and gangrenous left foot with gas in her foot. Final Clinical Impression(s) / ED Diagnoses Final diagnoses:  Diabetic ketoacidosis without coma associated with diabetes mellitus due to underlying condition ([facility]    Rx / DC Orders ED Discharge Orders     None        ZMilton Ferguson MD 06/25/21 1857

## 2021-06-25 NOTE — ED Notes (Signed)
lactic acid 3.4. MD made aware

## 2021-06-25 NOTE — Progress Notes (Addendum)
Spoke with Dr Micheline Maze CCM after he had seen the pt. She does meet criteria for ICU. The LR infusion likely caused falsely normal pH. Recommends urgent ortho consult, antibiotics and continue normal saline with 40kcl 250cc/hr (can reduce to 125 cc/hr if she is experiencing burning at IV site)   Spoke with Dr Laure Kidney, he recommended bilateral CT foot WO contrast as we are unable to get the MRIs because of patient's pacemaker. Also agreed with SCDs for DVT ppx. They will see the patient urgently and will likely go to the OR tomorrow.

## 2021-06-25 NOTE — Progress Notes (Signed)
Spoke with Dr Micheline Maze CCM, regarding patient's deterioration since she has been in the ED. Her mental status is worsening and her CBGs are not responding to IV insulin at the max dose. I explained am also concerned about the gas gangrene of the left foot. He recommended 250cc/hr N.saline with 40Kcl. CCM will see the patient shortly. We appreciate CCM input.  Lattie Haw MD  PGY-3, [place]

## 2021-06-25 NOTE — Progress Notes (Signed)
Notified bedside nurse of need to draw repeat lactic acid @ 1938 .

## 2021-06-25 NOTE — Progress Notes (Signed)
Notified bedside nurse of need to draw blood cultures.  

## 2021-06-25 NOTE — Progress Notes (Signed)
Pharmacy Antibiotic Note  Sarah Long is a 50 y.o. female presenting on 06/25/2021 with sepsis.  Pharmacy has been consulted for vancomycin dosing. CTX per MD.  Plan: TBW 92.5; Scr 3.20  Vancomycin 1750mg  IV bolus x1.  Variable dosing due to AKI and unstable kidney function. Monitor Scr and vanc levels.  F/u blood cx.      No data recorded.  Recent Labs  Lab 06/25/21 1735  CREATININE 3.20*    Estimated Creatinine Clearance: 23.9 mL/min (A) (by C-G formula based on SCr of 3.2 mg/dL (H)).    Allergies  Allergen Reactions   Strawberry Extract Hives   Sulfa Antibiotics Hives and Itching    Antimicrobials this admission: 7/21 CTX x 1 7/21 Vancomycin >>   Microbiology results: 7/21 BCx: Pending  Thank you for allowing pharmacy to be a part of this patient's care.  Marlowe Alt, PharmD Candidate 06/25/2021 5:57 PM

## 2021-06-25 NOTE — Progress Notes (Signed)
Elink following for code sepsis 

## 2021-06-26 ENCOUNTER — Other Ambulatory Visit: Payer: Self-pay | Admitting: Physician Assistant

## 2021-06-26 ENCOUNTER — Inpatient Hospital Stay (HOSPITAL_COMMUNITY): Payer: Medicaid Other

## 2021-06-26 ENCOUNTER — Telehealth: Payer: Self-pay

## 2021-06-26 ENCOUNTER — Other Ambulatory Visit: Payer: Self-pay

## 2021-06-26 ENCOUNTER — Encounter (HOSPITAL_COMMUNITY): Payer: Medicaid Other

## 2021-06-26 ENCOUNTER — Encounter (HOSPITAL_COMMUNITY): Payer: Self-pay | Admitting: Family Medicine

## 2021-06-26 DIAGNOSIS — E11621 Type 2 diabetes mellitus with foot ulcer: Secondary | ICD-10-CM | POA: Diagnosis not present

## 2021-06-26 DIAGNOSIS — E46 Unspecified protein-calorie malnutrition: Secondary | ICD-10-CM | POA: Diagnosis present

## 2021-06-26 DIAGNOSIS — E1142 Type 2 diabetes mellitus with diabetic polyneuropathy: Secondary | ICD-10-CM | POA: Diagnosis not present

## 2021-06-26 DIAGNOSIS — I509 Heart failure, unspecified: Secondary | ICD-10-CM

## 2021-06-26 DIAGNOSIS — N179 Acute kidney failure, unspecified: Secondary | ICD-10-CM | POA: Diagnosis not present

## 2021-06-26 DIAGNOSIS — E43 Unspecified severe protein-calorie malnutrition: Secondary | ICD-10-CM | POA: Diagnosis not present

## 2021-06-26 DIAGNOSIS — D649 Anemia, unspecified: Secondary | ICD-10-CM | POA: Diagnosis present

## 2021-06-26 DIAGNOSIS — R0600 Dyspnea, unspecified: Secondary | ICD-10-CM | POA: Diagnosis present

## 2021-06-26 DIAGNOSIS — I5022 Chronic systolic (congestive) heart failure: Secondary | ICD-10-CM

## 2021-06-26 DIAGNOSIS — A419 Sepsis, unspecified organism: Secondary | ICD-10-CM | POA: Diagnosis present

## 2021-06-26 DIAGNOSIS — R338 Other retention of urine: Secondary | ICD-10-CM | POA: Diagnosis not present

## 2021-06-26 DIAGNOSIS — E8809 Other disorders of plasma-protein metabolism, not elsewhere classified: Secondary | ICD-10-CM

## 2021-06-26 DIAGNOSIS — I96 Gangrene, not elsewhere classified: Secondary | ICD-10-CM | POA: Diagnosis not present

## 2021-06-26 DIAGNOSIS — E1121 Type 2 diabetes mellitus with diabetic nephropathy: Secondary | ICD-10-CM | POA: Diagnosis present

## 2021-06-26 DIAGNOSIS — L97524 Non-pressure chronic ulcer of other part of left foot with necrosis of bone: Secondary | ICD-10-CM

## 2021-06-26 HISTORY — DX: Other retention of urine: R33.8

## 2021-06-26 HISTORY — DX: Other disorders of plasma-protein metabolism, not elsewhere classified: E88.09

## 2021-06-26 LAB — COMPREHENSIVE METABOLIC PANEL
ALT: 18 U/L (ref 0–44)
AST: 52 U/L — ABNORMAL HIGH (ref 15–41)
Albumin: 1.4 g/dL — ABNORMAL LOW (ref 3.5–5.0)
Alkaline Phosphatase: 81 U/L (ref 38–126)
Anion gap: 14 (ref 5–15)
BUN: 83 mg/dL — ABNORMAL HIGH (ref 6–20)
CO2: 17 mmol/L — ABNORMAL LOW (ref 22–32)
Calcium: 7.6 mg/dL — ABNORMAL LOW (ref 8.9–10.3)
Chloride: 98 mmol/L (ref 98–111)
Creatinine, Ser: 3.12 mg/dL — ABNORMAL HIGH (ref 0.44–1.00)
GFR, Estimated: 18 mL/min — ABNORMAL LOW (ref >60)
Glucose, Bld: 464 mg/dL — ABNORMAL HIGH (ref 70–99)
Potassium: 6.1 mmol/L — ABNORMAL HIGH (ref 3.5–5.1)
Sodium: 129 mmol/L — ABNORMAL LOW (ref 135–145)
Total Bilirubin: 2.5 mg/dL — ABNORMAL HIGH (ref 0.3–1.2)
Total Protein: 6.9 g/dL (ref 6.5–8.1)

## 2021-06-26 LAB — CBC WITH DIFFERENTIAL/PLATELET
Abs Immature Granulocytes: 1.98 10*3/uL — ABNORMAL HIGH (ref 0.00–0.07)
Basophils Absolute: 0.1 10*3/uL (ref 0.0–0.1)
Basophils Relative: 0 %
Eosinophils Absolute: 0 10*3/uL (ref 0.0–0.5)
Eosinophils Relative: 0 %
HCT: 27.4 % — ABNORMAL LOW (ref 36.0–46.0)
Hemoglobin: 8.8 g/dL — ABNORMAL LOW (ref 12.0–15.0)
Immature Granulocytes: 5 %
Lymphocytes Relative: 6 %
Lymphs Abs: 2.7 10*3/uL (ref 0.7–4.0)
MCH: 28.1 pg (ref 26.0–34.0)
MCHC: 32.1 g/dL (ref 30.0–36.0)
MCV: 87.5 fL (ref 80.0–100.0)
Monocytes Absolute: 1.7 10*3/uL — ABNORMAL HIGH (ref 0.1–1.0)
Monocytes Relative: 4 %
Neutro Abs: 36.2 10*3/uL — ABNORMAL HIGH (ref 1.7–7.7)
Neutrophils Relative %: 85 %
Platelets: 404 10*3/uL — ABNORMAL HIGH (ref 150–400)
RBC: 3.13 MIL/uL — ABNORMAL LOW (ref 3.87–5.11)
RDW: 15.2 % (ref 11.5–15.5)
WBC: 42.7 10*3/uL — ABNORMAL HIGH (ref 4.0–10.5)
nRBC: 0 % (ref 0.0–0.2)

## 2021-06-26 LAB — CBC
HCT: 27 % — ABNORMAL LOW (ref 36.0–46.0)
Hemoglobin: 8.7 g/dL — ABNORMAL LOW (ref 12.0–15.0)
MCH: 27.7 pg (ref 26.0–34.0)
MCHC: 32.2 g/dL (ref 30.0–36.0)
MCV: 86 fL (ref 80.0–100.0)
Platelets: 406 10*3/uL — ABNORMAL HIGH (ref 150–400)
RBC: 3.14 MIL/uL — ABNORMAL LOW (ref 3.87–5.11)
RDW: 14.9 % (ref 11.5–15.5)
WBC: 35 10*3/uL — ABNORMAL HIGH (ref 4.0–10.5)
nRBC: 0 % (ref 0.0–0.2)

## 2021-06-26 LAB — BASIC METABOLIC PANEL
Anion gap: 15 (ref 5–15)
Anion gap: 13 (ref 5–15)
Anion gap: 11 (ref 5–15)
BUN: 83 mg/dL — ABNORMAL HIGH (ref 6–20)
BUN: 77 mg/dL — ABNORMAL HIGH (ref 6–20)
BUN: 76 mg/dL — ABNORMAL HIGH (ref 6–20)
CO2: 17 mmol/L — ABNORMAL LOW (ref 22–32)
CO2: 17 mmol/L — ABNORMAL LOW (ref 22–32)
CO2: 20 mmol/L — ABNORMAL LOW (ref 22–32)
Calcium: 7.5 mg/dL — ABNORMAL LOW (ref 8.9–10.3)
Calcium: 7.7 mg/dL — ABNORMAL LOW (ref 8.9–10.3)
Calcium: 7.7 mg/dL — ABNORMAL LOW (ref 8.9–10.3)
Chloride: 97 mmol/L — ABNORMAL LOW (ref 98–111)
Chloride: 103 mmol/L (ref 98–111)
Chloride: 102 mmol/L (ref 98–111)
Creatinine, Ser: 3.18 mg/dL — ABNORMAL HIGH (ref 0.44–1.00)
Creatinine, Ser: 2.7 mg/dL — ABNORMAL HIGH (ref 0.44–1.00)
Creatinine, Ser: 2.54 mg/dL — ABNORMAL HIGH (ref 0.44–1.00)
GFR, Estimated: 17 mL/min — ABNORMAL LOW (ref >60)
GFR, Estimated: 21 mL/min — ABNORMAL LOW (ref >60)
GFR, Estimated: 23 mL/min — ABNORMAL LOW (ref >60)
Glucose, Bld: 537 mg/dL (ref 70–99)
Glucose, Bld: 224 mg/dL — ABNORMAL HIGH (ref 70–99)
Glucose, Bld: 238 mg/dL — ABNORMAL HIGH (ref 70–99)
Potassium: 3.6 mmol/L (ref 3.5–5.1)
Potassium: 3.4 mmol/L — ABNORMAL LOW (ref 3.5–5.1)
Potassium: 5.5 mmol/L — ABNORMAL HIGH (ref 3.5–5.1)
Sodium: 129 mmol/L — ABNORMAL LOW (ref 135–145)
Sodium: 133 mmol/L — ABNORMAL LOW (ref 135–145)
Sodium: 133 mmol/L — ABNORMAL LOW (ref 135–145)

## 2021-06-26 LAB — BLOOD GAS, VENOUS
Acid-base deficit: 6.4 mmol/L — ABNORMAL HIGH (ref 0.0–2.0)
Bicarbonate: 18.1 mmol/L — ABNORMAL LOW (ref 20.0–28.0)
Drawn by: 5915
O2 Saturation: 95 %
Patient temperature: 37
pCO2, Ven: 33.3 mmHg — ABNORMAL LOW (ref 44.0–60.0)
pH, Ven: 7.353 (ref 7.250–7.430)
pO2, Ven: 84.8 mmHg — ABNORMAL HIGH (ref 32.0–45.0)

## 2021-06-26 LAB — RAPID URINE DRUG SCREEN, HOSP PERFORMED
Amphetamines: NOT DETECTED
Barbiturates: NOT DETECTED
Benzodiazepines: NOT DETECTED
Cocaine: NOT DETECTED
Opiates: NOT DETECTED
Tetrahydrocannabinol: NOT DETECTED

## 2021-06-26 LAB — I-STAT BETA HCG BLOOD, ED (NOT ORDERABLE): I-stat hCG, quantitative: <5 m[IU]/mL (ref <5)

## 2021-06-26 LAB — GLUCOSE, CAPILLARY
Glucose-Capillary: 180 mg/dL — ABNORMAL HIGH (ref 70–99)
Glucose-Capillary: 190 mg/dL — ABNORMAL HIGH (ref 70–99)
Glucose-Capillary: 228 mg/dL — ABNORMAL HIGH (ref 70–99)
Glucose-Capillary: 228 mg/dL — ABNORMAL HIGH (ref 70–99)
Glucose-Capillary: 251 mg/dL — ABNORMAL HIGH (ref 70–99)
Glucose-Capillary: 262 mg/dL — ABNORMAL HIGH (ref 70–99)
Glucose-Capillary: 273 mg/dL — ABNORMAL HIGH (ref 70–99)
Glucose-Capillary: 302 mg/dL — ABNORMAL HIGH (ref 70–99)
Glucose-Capillary: 361 mg/dL — ABNORMAL HIGH (ref 70–99)
Glucose-Capillary: 378 mg/dL — ABNORMAL HIGH (ref 70–99)
Glucose-Capillary: 479 mg/dL — ABNORMAL HIGH (ref 70–99)

## 2021-06-26 LAB — CBG MONITORING, ED: Glucose-Capillary: 506 mg/dL (ref 70–99)

## 2021-06-26 LAB — BETA-HYDROXYBUTYRIC ACID
Beta-Hydroxybutyric Acid: 0.18 mmol/L (ref 0.05–0.27)
Beta-Hydroxybutyric Acid: 0.67 mmol/L — ABNORMAL HIGH (ref 0.05–0.27)

## 2021-06-26 LAB — TROPONIN I (HIGH SENSITIVITY): Troponin I (High Sensitivity): 70 ng/L — ABNORMAL HIGH (ref <18)

## 2021-06-26 LAB — SURGICAL PCR SCREEN
MRSA, PCR: NEGATIVE
Staphylococcus aureus: NEGATIVE

## 2021-06-26 LAB — MAGNESIUM: Magnesium: 2 mg/dL (ref 1.7–2.4)

## 2021-06-26 LAB — HIV ANTIBODY (ROUTINE TESTING W REFLEX): HIV Screen 4th Generation wRfx: NONREACTIVE

## 2021-06-26 LAB — CK: Total CK: 110 U/L (ref 38–234)

## 2021-06-26 LAB — AMMONIA: Ammonia: 43 umol/L — ABNORMAL HIGH (ref 9–35)

## 2021-06-26 LAB — LACTIC ACID, PLASMA: Lactic Acid, Venous: 3.8 mmol/L (ref 0.5–1.9)

## 2021-06-26 LAB — ETHANOL: Alcohol, Ethyl (B): <10 mg/dL (ref <10)

## 2021-06-26 MED ORDER — INSULIN GLARGINE 100 UNIT/ML ~~LOC~~ SOLN
26.0000 [IU] | SUBCUTANEOUS | Status: DC
  Administered 2021-06-26 – 2021-06-27 (×2): 26 [IU] via SUBCUTANEOUS
  Filled 2021-06-26 (×3): qty 0.26

## 2021-06-26 MED ORDER — POTASSIUM CHLORIDE 10 MEQ/100ML IV SOLN
10.0000 meq | INTRAVENOUS | Status: DC
Start: 2021-06-26 — End: 2021-06-26
  Administered 2021-06-26 (×2): 10 meq via INTRAVENOUS
  Filled 2021-06-26 (×2): qty 100

## 2021-06-26 MED ORDER — JUVEN PO PACK
1.0000 | PACK | Freq: Two times a day (BID) | ORAL | Status: DC
  Administered 2021-06-27 – 2021-06-30 (×7): 1 via ORAL
  Filled 2021-06-26 (×7): qty 1

## 2021-06-26 MED ORDER — ENSURE MAX PROTEIN PO LIQD
11.0000 [oz_av] | Freq: Every day | ORAL | Status: DC
  Administered 2021-06-28: 11 [oz_av] via ORAL
  Filled 2021-06-26 (×5): qty 330

## 2021-06-26 MED ORDER — INSULIN ASPART 100 UNIT/ML IJ SOLN
0.0000 [IU] | INTRAMUSCULAR | Status: DC
  Administered 2021-06-26 (×2): 5 [IU] via SUBCUTANEOUS
  Administered 2021-06-26: 3 [IU] via SUBCUTANEOUS
  Administered 2021-06-27 (×2): 5 [IU] via SUBCUTANEOUS
  Administered 2021-06-27 (×2): 3 [IU] via SUBCUTANEOUS

## 2021-06-26 MED ORDER — DEXTROSE-NACL 5-0.9 % IV SOLN: INTRAVENOUS | Status: DC

## 2021-06-26 MED ORDER — SODIUM CHLORIDE 0.9 % IV SOLN: INTRAVENOUS | Status: DC

## 2021-06-26 MED ORDER — LIVING WELL WITH DIABETES BOOK
Freq: Once | Status: AC
  Filled 2021-06-26: qty 1

## 2021-06-26 MED ORDER — SODIUM CHLORIDE 0.9 % IV SOLN
2.0000 g | Freq: Two times a day (BID) | INTRAVENOUS | Status: DC
  Administered 2021-06-26 – 2021-06-28 (×5): 2 g via INTRAVENOUS
  Filled 2021-06-26 (×6): qty 2

## 2021-06-26 MED ORDER — ADULT MULTIVITAMIN W/MINERALS CH
1.0000 | ORAL_TABLET | Freq: Every day | ORAL | Status: DC
  Administered 2021-06-27 – 2021-07-01 (×5): 1 via ORAL
  Filled 2021-06-26 (×5): qty 1

## 2021-06-26 MED ORDER — DEXTROSE IN LACTATED RINGERS 5 % IV SOLN: INTRAVENOUS | Status: DC

## 2021-06-26 MED ORDER — ZINC OXIDE 40 % EX OINT
TOPICAL_OINTMENT | Freq: Two times a day (BID) | CUTANEOUS | Status: DC
  Filled 2021-06-26: qty 57

## 2021-06-26 MED ORDER — SODIUM CHLORIDE 0.9 % IV SOLN
6.2500 mg | Freq: Once | INTRAVENOUS | Status: AC
  Administered 2021-06-26: 6.25 mg via INTRAVENOUS
  Filled 2021-06-26: qty 0.25

## 2021-06-26 MED ORDER — CEFAZOLIN SODIUM-DEXTROSE 2-4 GM/100ML-% IV SOLN
2.0000 g | INTRAVENOUS | Status: DC
  Filled 2021-06-26: qty 100

## 2021-06-26 MED ORDER — SODIUM CHLORIDE 0.9% IV SOLUTION: Freq: Once | INTRAVENOUS | Status: DC

## 2021-06-26 NOTE — Progress Notes (Signed)
Inpatient Diabetes Program Recommendations  AACE/ADA: New Consensus Statement on Inpatient Glycemic Control (2015)  Target Ranges:  Prepandial:   less than 140 mg/dL      Peak postprandial:   less than 180 mg/dL (1-2 hours)      Critically ill patients:  140 - 180 mg/dL   Lab Results  Component Value Date   GLUCAP 228 (H) 06/26/2021   HGBA1C 13.6 (A) 06/18/2021    Review of Glycemic Control Results for Sarah Long, Sarah Long (MRN [id number]) as of 06/26/2021 13:12  Ref. Range 06/26/2021 06:18 06/26/2021 07:25 06/26/2021 08:38 06/26/2021 11:59  Glucose-Capillary Latest Ref Range: 70 - 99 mg/dL 273 (H) 180 (H) 190 (H) 228 (H)   Diabetes history: DM2 Outpatient Diabetes medications: Lantus 40 units QD, Novolog 0-12 units TID and 10 units TID with meals, Lantus 40 units QD Current orders for Inpatient glycemic control: Novolog 0-9 units Q4H, Lantus 26 units QD  Inpatient Diabetes Program Recommendations:    Please consider Lantus 32 units QD (80% of home dose).    Will speak with patient today and add addendum.  Will continue to follow while inpatient.  Thank you, Reche Dixon, RN, BSN Diabetes Coordinator Inpatient Diabetes Program [phone] (team pager from 8a-5p)

## 2021-06-26 NOTE — Evaluation (Signed)
Physical Therapy Evaluation Patient Details Name: Sarah Long MRN: [id number] DOB: May 09, 1971 Today's Date: 06/26/2021   History of Present Illness  Pt is a 50 y/o female admitted secondary to worsening fatigue, polydipsia and nausea in the setting of DKA. Pt admitted due to necrotizing fasciitis of L foot, uncontrolled DM2 and metabolic encephalopathy. PMH but not limited to: T2DM, HTN, HLD, CHF, pancreatitis, hx of ETOh abuse, pacemaker implant (2021), bipolar and depression   Clinical Impression  Pt presented supine in bed with HOB elevated, awake and willing to participate in therapy session. Prior to admission, pt reported that she was independent with all functional mobility and ADLs. Pt lives with her children in a hotel room on the ground level. At the time of evaluation, pt limited with mobility overall secondary to fatigue and weakness. She was able to perform bed mobility with min A and transfers with min A x2 and use of RW. Attempted to have pt maintain [place] L LE as the plan is likely for pt to undergo transtibial amputation per ortho note; however, pt had great difficulty maintaining NWB'ing throughout transfers even with multimodal cueing. Pt would continue to benefit from skilled physical therapy services at this time while admitted and after d/c to address the below listed limitations in order to improve overall safety and independence with functional mobility.     Follow Up Recommendations SNF    Equipment Recommendations  Rolling walker with 5" wheels;3in1 (PT);Wheelchair (measurements PT);Wheelchair cushion (measurements PT)    Recommendations for Other Services       Precautions / Restrictions Precautions Precautions: Fall Restrictions Other Position/Activity Restrictions: no current WB'ing orders, per ortho likely BKA needed. attempted to maintain [place] L LE throughout      Mobility  Bed Mobility Overal bed mobility: Needs Assistance Bed Mobility: Supine to  Sit;Sit to Supine     Supine to sit: Min assist Sit to supine: Min guard   General bed mobility comments: increased time and effort needed, HOB elevated, use of bed rails, assistance needed for trunk elevation    Transfers Overall transfer level: Needs assistance Equipment used: Rolling walker (2 wheeled) Transfers: Sit to/from [facility] Sit to Stand: Min assist;+2 safety/equipment;+2 physical assistance;From elevated surface Stand pivot transfers: Min assist;+2 safety/equipment;+2 physical assistance       General transfer comment: pt required multimodal cueing for appropriate hand placement and technique, frequent reminders to keep her L foot off of the ground (unable to do)  Ambulation/Gait             General Gait Details: did not attempt as pt unable to maintain [place] L LE with transfers  Stairs            Wheelchair Mobility    Modified Rankin (Stroke Patients Only)       Balance Overall balance assessment: Needs assistance Sitting-balance support: Feet supported Sitting balance-Leahy Scale: Good     Standing balance support: During functional activity;Bilateral upper extremity supported;Single extremity supported Standing balance-Leahy Scale: Poor                               Pertinent Vitals/Pain Pain Assessment: Faces Faces Pain Scale: Hurts even more Pain Location: R LE, L LE with movements but denying pain Pain Descriptors / Indicators: Grimacing Pain Intervention(s): Monitored during session    Home Living Family/patient expects to be discharged to:: Other (Comment)  Additional Comments: pt reported living in a hotel with her children    Prior Function Level of Independence: Independent               Hand Dominance   Dominant Hand: Right    Extremity/Trunk Assessment   Upper Extremity Assessment Upper Extremity Assessment: Defer to OT evaluation    Lower Extremity  Assessment Lower Extremity Assessment: Generalized weakness       Communication   Communication: No difficulties  Cognition Arousal/Alertness: Lethargic Behavior During Therapy: Flat affect Overall Cognitive Status: Impaired/Different from baseline Area of Impairment: Orientation;Attention;Memory;Following commands;Safety/judgement;Awareness;Problem solving                 Orientation Level: Disoriented to;Situation Current Attention Level: Selective Memory: Decreased recall of precautions Following Commands: Follows one step commands with increased time Safety/Judgement: Decreased awareness of deficits;Decreased awareness of safety Awareness: Intellectual Problem Solving: Slow processing;Difficulty sequencing;Requires verbal cues        General Comments      Exercises     Assessment/Plan    PT Assessment Patient needs continued PT services  PT Problem List Decreased strength;Decreased activity tolerance;Decreased balance;Decreased mobility;Decreased coordination;Decreased cognition;Decreased knowledge of use of DME;Decreased knowledge of precautions;Decreased safety awareness;Pain       PT Treatment Interventions DME instruction;Gait training;Stair training;Functional mobility training;Therapeutic activities;Therapeutic exercise;Neuromuscular re-education;Balance training;Patient/family education;Cognitive remediation    PT Goals (Current goals can be found in the Care Plan section)  Acute Rehab PT Goals Patient Stated Goal: return home PT Goal Formulation: With patient Time For Goal Achievement: 07/10/21 Potential to Achieve Goals: Fair    Frequency Min 3X/week   Barriers to discharge        Co-evaluation PT/OT/SLP Co-Evaluation/Treatment: Yes Reason for Co-Treatment: Necessary to address cognition/behavior during functional activity;For patient/therapist safety;To address functional/ADL transfers PT goals addressed during session: Mobility/safety with  mobility;Balance;Proper use of DME;Strengthening/ROM         AM-PAC PT "6 Clicks" Mobility  Outcome Measure Help needed turning from your back to your side while in a flat bed without using bedrails?: None Help needed moving from lying on your back to sitting on the side of a flat bed without using bedrails?: A Little Help needed moving to and from a bed to a chair (including a wheelchair)?: A Little Help needed standing up from a chair using your arms (e.g., wheelchair or bedside chair)?: A Little Help needed to walk in hospital room?: A Lot Help needed climbing 3-5 steps with a railing? : Total 6 Click Score: 16    End of Session Equipment Utilized During Treatment: Gait belt Activity Tolerance: Patient limited by fatigue;Patient limited by lethargy Patient left: in bed;with call bell/phone within reach;with bed alarm set Nurse Communication: Mobility status PT Visit Diagnosis: Other abnormalities of gait and mobility (R26.89)    Time: [id number] PT Time Calculation (min) (ACUTE ONLY): 26 min   Charges:   PT Evaluation $PT Eval Moderate Complexity: 1 Mod          Eduard Clos, PT, DPT  Acute Rehabilitation Services Office [place] 06/26/2021, 10:47 AM

## 2021-06-26 NOTE — Progress Notes (Signed)
No urinary output since pt arrival to the floor. Bladder scan indicated greater than 961m of urine. MD on call notified.

## 2021-06-26 NOTE — Progress Notes (Signed)
Per Dr. Sharol Given, ABI to be discontinued due to recent thorough arterial exam on 06/11/2021.  06/26/2021 1:01 PM Kelby Aline., MHA, RVT, RDCS, RDMS

## 2021-06-26 NOTE — Consult Note (Signed)
ORTHOPAEDIC CONSULTATION  REQUESTING PHYSICIAN: Leeanne Rio, MD  Chief Complaint: Patient reports a several week history of pain ulceration and gangrenous changes to the left foot.  HPI: Sarah Long is a 50 y.o. female who presents with uncontrolled type 2 diabetes severe protein caloric malnutrition and diabetic peripheral vascular disease.  She states she has had over 3-week history of painful necrotic changes to the toes on her left foot.  She underwent a arteriogram study of the left lower extremity 1 week ago.  Patient states that she has 3 children that she cares for.  Past Medical History:  Diagnosis Date   Abnormal Pap smear 1998   Abnormal vaginal bleeding 12/19/2011   Acute urinary retention 06/26/2021   Bacterial infection    Bipolar 1 disorder (HCC)    Blister of second toe of left foot 11/21/2016   Candida vaginitis 07/2007   Depression    recently added wellbutrin-has not taken yet for bipolar   Diabetes in pregnancy    Diabetes mellitus    nph 20U qam and qpm, regular with meals   Diabetic ketoacidosis without coma associated with type 2 diabetes mellitus ([place])    Fibroid    Galactorrhea of right breast 2008   H/O amenorrhea 07/2007   H/O dizziness 10/14/11   H/O dysmenorrhea 2010   H/O menorrhagia 10/14/11   H/O varicella    Headache(784.0)    Heavy vaginal bleeding due to contraceptive injection use 10/12/2011   Depo Provera   Herpes    HSV-2 infection 01/03/2009   Hx: UTI (urinary tract infection) 2009   Hypertension    on aldomet   Increased BMI 2010   Irregular uterine bleeding 04/04/2012   Pt has mirena    Obesity 10/14/11   Oligomenorrhea 07/2007   Pelvic pain in female    Preterm labor    Trichomonas    Yeast infection    Past Surgical History:  Procedure Laterality Date   ABDOMINAL AORTOGRAM W/LOWER EXTREMITY N/A 06/17/2021   Procedure: ABDOMINAL AORTOGRAM W/LOWER EXTREMITY;  Surgeon: Wellington Hampshire, MD;  Location: [facility];   Service: Cardiovascular;  Laterality: N/A;   CESAREAN SECTION  1991   LEFT HEART CATH AND CORONARY ANGIOGRAPHY N/A 02/10/2020   Procedure: LEFT HEART CATH AND CORONARY ANGIOGRAPHY;  Surgeon: Troy Sine, MD;  Location: [facility];  Service: Cardiovascular;  Laterality: N/A;   PACEMAKER IMPLANT N/A 02/13/2020   Procedure: PACEMAKER IMPLANT;  Surgeon: Constance Haw, MD;  Location: [facility];  Service: Cardiovascular;  Laterality: N/A;   TEMPORARY PACEMAKER N/A 02/10/2020   Procedure: TEMPORARY PACEMAKER;  Surgeon: Troy Sine, MD;  Location: [facility];  Service: Cardiovascular;  Laterality: N/A;   Social History   Socioeconomic History   Marital status: Married    Spouse name: Not on file   Number of children: Not on file   Years of education: Not on file   Highest education level: Not on file  Occupational History   Not on file  Tobacco Use   Smoking status: Former    Packs/day: 0.50    Years: 20.00    Pack years: 10.00    Types: Cigarettes   Smokeless tobacco: Never  Vaping Use   Vaping Use: Never used  Substance and Sexual Activity   Alcohol use: No   Drug use: No   Sexual activity: Yes    Birth control/protection: None  Other Topics Concern  Not on file  Social History Narrative   Not on file   Social Determinants of Health   Financial Resource Strain: Not on file  Food Insecurity: Not on file  Transportation Needs: Not on file  Physical Activity: Not on file  Stress: Not on file  Social Connections: Not on file   Family History  Problem Relation Age of Onset   Hypertension Mother    Diabetes Mother    Heart disease Mother    Hypertension Father    Diabetes Father    Heart disease Father    Stroke Maternal Grandfather    Other Neg Hx    - negative except otherwise stated in the family history section Allergies  Allergen Reactions   Strawberry Extract Hives   Sulfa Antibiotics Hives and Itching   Prior to Admission  medications   Medication Sig Start Date End Date Taking? Authorizing Provider  Accu-Chek Softclix Lancets lancets Use as instructed to check blood sugar three times daily. E11.65 06/18/21  Yes [name], MD  acetaminophen (TYLENOL) 650 MG CR tablet Take 1,950 mg by mouth daily as needed for pain.   Yes [provider]  Blood Glucose Monitoring Suppl (ACCU-CHEK GUIDE) w/Device KIT Use as directed daily 05/26/21  Yes Fenton Foy, NP  chlorthalidone (HYGROTON) 25 MG tablet Take 1 tablet (25 mg total) by mouth daily. 06/18/21  Yes [name], MD  gabapentin (NEURONTIN) 300 MG capsule Take 2 capsules (600 mg total) by mouth 3 (three) times daily. 06/18/21  Yes [name], MD  glucose blood (ACCU-CHEK GUIDE) test strip Use as instructed to check blood sugar three times daily. E11.65 06/18/21  Yes [name], MD  insulin aspart (NOVOLOG FLEXPEN) 100 UNIT/ML FlexPen Inject 10-20 Units into the skin 3 (three) times daily with meals. 10 unit with meals Sliding scale three times daily: 0-150: 0 151-200: 2 units  201-250: 4 units  251-300: 6 units  301-350: 8 units  351- 400 10 units   >400  12 units 06/18/21  Yes [name], MD  insulin glargine (LANTUS SOLOSTAR) 100 UNIT/ML Solostar Pen Inject 40 Units into the skin at bedtime. 06/18/21  Yes [name], MD  Insulin Pen Needle (B-D ULTRAFINE III SHORT PEN) 31G X 8 MM MISC use as directed to inject insulin 06/18/21  Yes [name], MD  Lancet Devices (ADJUSTABLE LANCING DEVICE) MISC Inject 1 each into the skin 3 (three) times daily. 05/26/21  Yes Fenton Foy, NP  lurasidone (LATUDA) 40 MG TABS tablet Take 1 tablet by mouth every evening with meals to start on or after 04/30/21 Patient taking differently: Take 40 mg by mouth every evening. 04/16/21  Yes   Multiple Vitamins-Minerals (ADULT ONE DAILY GUMMIES PO) Take 1 capsule by mouth daily.   Yes [provider]  mupirocin ointment (BACTROBAN) 2 %  APPLY A THIN film OF OINTMENT to the open wounds on your toes every other day x 30 days or until healed Patient taking differently: Apply 1 application topically every other day. 05/27/21  Yes   Pancrelipase, Lip-Prot-Amyl, (ZENPEP) 20000-63000 units CPEP Take 1 capsule by mouth 3 (three) times daily before meals. 06/18/21  Yes [name], MD  rosuvastatin (CRESTOR) 10 MG tablet Take 1 tablet (10 mg total) by mouth daily. 06/18/21 09/16/21 Yes [name], MD   CT HEAD WO CONTRAST  Result Date: 06/25/2021 CLINICAL DATA:  Mental status change, alcohol/drug use. EXAM: CT HEAD WITHOUT  CONTRAST TECHNIQUE: Contiguous axial images were obtained from the base of the skull through the vertex without intravenous contrast. COMPARISON:  CT head 01/03/2021 FINDINGS: Brain: No evidence of large-territorial acute infarction. No parenchymal hemorrhage. No mass lesion. No extra-axial collection. No mass effect or midline shift. No hydrocephalus. Basilar cisterns are patent. Vascular: No hyperdense vessel. Skull: No acute fracture or focal lesion. Sinuses/Orbits: Right maxillary sinus mucosal thickening. Otherwise remaining visualized paranasal sinuses and mastoid air cells are clear. The orbits are unremarkable. Other: None. IMPRESSION: No acute intracranial abnormality. Electronically Signed   By: Iven Finn M.D.   On: 06/25/2021 21:59   CT FOOT LEFT WO CONTRAST  Result Date: 06/26/2021 CLINICAL DATA:  Foot swelling. EXAM: CT OF THE LEFT FOOT WITHOUT CONTRAST TECHNIQUE: Multidetector CT imaging of the left foot was performed according to the standard protocol. Multiplanar CT image reconstructions were also generated. COMPARISON:  None. FINDINGS: Study limited by lack of intravenous contrast material. Bones/Joint/Cartilage Gas is identified in the marrow space of multiple bones of the midfoot, including the inferior navicula, the cuboid, and the middle and lateral cuneiform bones. Gas is also seen in the  base of the second, third, and fourth metatarsals. This is associated with apparent decreased mineralization in the same regions. Imaging features compatible with osteomyelitis. Ligaments Suboptimally assessed by CT. Muscles and Tendons Diffuse muscular and subcutaneous edema evident. Overlying skin thickening noted. Soft tissues There is diffuse soft tissue gas throughout the left foot extending up into the ankle. Extensive muscular and soft tissue edema. No discrete abscess evident although assessment is substantially limited by the lack of intravenous contrast material. IMPRESSION: 1. Extensive soft tissue gas in the foot and visualized portion of the ankle. Necrotizing fasciitis is a distinct concern. 2. Gas is identified in the marrow space of multiple bones of the midfoot consistent with associated osteomyelitis. 3. Extensive muscular and soft tissue edema without a discrete fluid collection/abscess evident although assessment is markedly limited by lack of intravenous contrast material. 4. MRI of the foot with and without contrast recommended for more definitive assessment. These results will be called to the ordering clinician or representative by the Radiologist Assistant, and communication documented in the PACS or [employer]. Electronically Signed   By: Misty Stanley M.D.   On: 06/26/2021 06:52   CT FOOT RIGHT WO CONTRAST  Result Date: 06/26/2021 CLINICAL DATA:  Foot swelling. EXAM: CT OF THE RIGHT FOOT WITHOUT CONTRAST TECHNIQUE: Multidetector CT imaging of the right foot was performed according to the standard protocol. Multiplanar CT image reconstructions were also generated. COMPARISON:  None. FINDINGS: Study limited by lack of intravenous contrast material. Bones/Joint/Cartilage No evidence for an acute fracture or dislocation. No gross bony destruction to suggest overt osteomyelitis. Subtle cortical irregularity noted in the great toe (23/8), nonspecific. Ligaments Suboptimally assessed by  CT. Muscles and Tendons Unremarkable. Soft tissues Subcutaneous edema noted, most pronounced in the midfoot and region of the MTP joints. No gas identified within the soft tissues. No gross discrete fluid collection evident. IMPRESSION: 1. Subcutaneous edema, most pronounced in the midfoot and region of the MTP joints. No gas identified within the soft tissues. No gross discrete fluid collection. 2. No gross bony destruction to suggest overt osteomyelitis. Subtle cortical irregularity in the great toe, nonspecific. 3. If there is clinical concern for osteomyelitis, MRI of the foot with and without contrast recommended to further evaluate. MRI is also more sensitive in the detection of soft tissue abscesses. Electronically Signed   By: Randall Hiss  Tery Sanfilippo M.D.   On: 06/26/2021 06:39   MR FOOT RIGHT WO CONTRAST  Result Date: 06/26/2021 CLINICAL DATA:  Foot swelling, diabetic, osteomyelitis suspected, no prior imaging EXAM: MRI OF THE RIGHT FOREFOOT WITHOUT CONTRAST TECHNIQUE: Multiplanar, multisequence MR imaging of the right forefoot was performed. No intravenous contrast was administered. COMPARISON:  None. FINDINGS: Bones/Joint/Cartilage There is increased STIR signal throughout the great toe distal phalanx and partially within the proximal phalanx. There is some low T1 signal but not confluent. Additionally, there is edema signal throughout the middle cuneiform without visible fracture line. Ligaments Intact Lisfranc ligament.  No evidence of plantar plate tear Muscles and Tendons No significant muscle atrophy. Intramuscular edema within the intrinsic foot musculature, commonly seen in diabetics. Soft tissues Mild dorsal soft tissue swelling. There is swelling and possible ulcer along the medial aspect of the great toe. IMPRESSION: Edema signal with mild low T1 signal within the great toe distal phalanx and partially within the proximal phalanx, with possible adjacent soft tissue ulcer. Signal characteristics are  compatible with either early osteomyelitis or reactive marrow signal change. Bony edema throughout the middle cuneiform without distinct fracture or confluent low T1 signal to suggest infection. This is favored to represent reactive marrow signal change. Electronically Signed   By: Maurine Simmering   On: 06/26/2021 15:58   DG Chest Port 1 View  Result Date: 06/25/2021 CLINICAL DATA:  Sepsis, hyperglycemia EXAM: PORTABLE CHEST 1 VIEW COMPARISON:  03/29/2021 FINDINGS: Single frontal view of the chest demonstrates stable dual lead pacer. Cardiac silhouette is enlarged but stable. No airspace disease, effusion, or pneumothorax. No acute bony abnormalities. IMPRESSION: 1. No acute intrathoracic process. Electronically Signed   By: Randa Ngo M.D.   On: 06/25/2021 18:20   DG Foot Complete Left  Result Date: 06/25/2021 CLINICAL DATA:  Hyperglycemia, question sepsis. Gangrenous left foot. EXAM: LEFT FOOT - COMPLETE 3+ VIEW COMPARISON:  None. FINDINGS: No cortical erosion or destruction. There is no evidence of fracture or dislocation. There is no evidence of arthropathy or other focal bone abnormality. Diffuse marked subcutaneus soft tissue edema and emphysema. Vascular calcifications. IMPRESSION: 1. Diffuse marked subcutaneus soft tissue edema and emphysema. Correlate clinically for necrotizing fasciitis. 2. No radiographic findings to suggest osteomyelitis. 3.  No acute displaced fracture or dislocation. Electronically Signed   By: Iven Finn M.D.   On: 06/25/2021 18:20   - pertinent xrays, CT, MRI studies were reviewed and independently interpreted  Positive ROS: All other systems have been reviewed and were otherwise negative with the exception of those mentioned in the HPI and as above.  Physical Exam: General: Alert, no acute distress Psychiatric: Patient is competent for consent with normal mood and affect Lymphatic: No axillary or cervical lymphadenopathy Cardiovascular: No pedal  edema Respiratory: No cyanosis, no use of accessory musculature GI: No organomegaly, abdomen is soft and non-tender    Images:  $Remove'@ENCIMAGES'[id number]$ @  Labs:  Lab Results  Component Value Date   HGBA1C 13.6 (A) 06/18/2021   HGBA1C 8.4 (A) 05/15/2020   HGBA1C 12.7 (H) 02/11/2020   LABURIC 4.1 10/21/2016   LABURIC 4.2 11/20/2010   REPTSTATUS 05/21/2021 FINAL 05/19/2021   GRAMSTAIN  02/13/2013    RARE WBC PRESENT, PREDOMINANTLY MONONUCLEAR RARE SQUAMOUS EPITHELIAL CELLS PRESENT RARE GRAM POSITIVE COCCI IN CLUSTERS IN PAIRS   CULT >=100,000 COLONIES/mL KLEBSIELLA PNEUMONIAE (A) 05/19/2021   LABORGA KLEBSIELLA PNEUMONIAE (A) 05/19/2021    Lab Results  Component Value Date   ALBUMIN 1.4 (L) 06/26/2021   ALBUMIN 1.5 (  L) 06/25/2021   ALBUMIN 3.2 (L) 06/18/2021   LABURIC 4.1 10/21/2016   LABURIC 4.2 11/20/2010     CBC EXTENDED Latest Ref Rng & Units 06/26/2021 06/26/2021 06/25/2021  WBC 4.0 - 10.5 K/uL 42.7(H) 35.0(H) -  RBC 3.87 - 5.11 MIL/uL 3.13(L) 3.14(L) -  HGB 12.0 - 15.0 g/dL 8.8(L) 8.7(L) 9.9(L)  HCT 36.0 - 46.0 % 27.4(L) 27.0(L) 29.0(L)  PLT 150 - 400 K/uL 404(H) 406(H) -  NEUTROABS 1.7 - 7.7 K/uL 36.2(H) - -  LYMPHSABS 0.7 - 4.0 K/uL 2.7 - -    Neurologic: Patient does not have protective sensation bilateral lower extremities.   MUSCULOSKELETAL:   Skin: Examination patient has dry gangrene of the forefoot.  She has blistering and ulceration that extends up to the ankle.  There is no crepitation in the calf but her calf is tender to palpation there is no ascending cellulitis past the ankle.  Patient has a hemoglobin A1c of 13.6 and albumin of 1.4.  White cell count 42.7 with a hemoglobin of 8.8.  1 week ago patient did undergo arteriogram study of the left lower extremity.  Studies showed inline flow all 3 vessels to the ankle.  Patient has mild ischemic changes to the toes on the right foot.  Assessment: Assessment: Uncontrolled type 2 diabetes with severe protein  caloric malnutrition gangrene of the left forefoot and abscess and ulceration extending up to the ankle.  Mild ischemic changes to the toes on the right foot.  Plan: We will plan for a left below the knee amputation tomorrow Saturday.  I will type and cross and transfuse with 1 unit packed red blood cells prior to surgery.  Thank you for the consult and the opportunity to see Ms. Evern Bio, [place] [phone] 5:28 PM

## 2021-06-26 NOTE — Progress Notes (Signed)
Family Medicine Teaching Service Daily Progress Note Intern Pager: [phone]  Patient name: Sarah Long Medical record number: [id number] Date of birth: [birth year] Age: 50 y.o. Gender: female  Primary Care Provider: [name], MD Consultants: Ortho Code Status: Full  Pt Overview and Major Events to Date:  7/21- Admitted  Assessment and Plan: Patient is a 50 year old female with necrotizing fasciitis of left foot.  Ortho following.  Possible procedure today.  Past medical history significant for poorly controlled type 2 diabetes mellitus, hypertension, hyperlipidemia, CHF, chronic pancreatitis, history of alcohol abuse, and bipolar disorder.  Left Foot Wound CT concerning for necrotizing fascitis.  Ortho aware and closely following.  ABI ordered to assess vascular status.  Patient confused but vitals stable and no signs of worsening systemic infection.  - follow up Ortho recs - NPO for possible procedure, did receive Gram crackers early this AM - Continue Vanc, Clinda, and Cefipime  Right Foot Wound No sign of Nec fac on CT.  Need MRI to further assess for Osteomyelitis.  Will be assessed if can obtain today due to pacemaker. - follow-up ortho recs - follow up MRI  Type 2 Diabetes Mellitis Anion gap now closed.  Started Lantus 26U at 6 AM and Endotool stopped 2 hours following.  K-3.4 down from 6.1 this AM. - CBG's q 1 hr - Continue Lantus qd - sSSI q4hr - mIV NS while NPO - Replete IV K  Abnormal EKG Read electronically as indicative of possible MI.  Patient dneying any chest pain or shortness of breath. - Obtain Trops - Obtain repeat EKG  Encephelopathy Alert and oriented to person and place only.  Follows commands appropriately.  Unsure of baseline.  Could be multifactorial given DKA, Acute infection, and Bipolar Disorder.     - Continue to monitor mental status - Reach out to family for consent for procedure if patient still confused   FEN/GI: NPO PPx:  SCD's Dispo: Unkown  Barriers include Nec Fasc.   Subjective:  Patient indicates nothing bothering.  Objective: Temp:  [97.7 F (36.5 C)-98.3 F (36.8 C)] 97.7 F (36.5 C) (07/22 0732) Pulse Rate:  [78-97] 81 (07/22 0732) Resp:  [15-27] 20 (07/22 0732) BP: (106-140)/(59-86) 117/59 (07/22 0732) SpO2:  [93 %-100 %] 100 % (07/22 0732) Physical Exam: General: NAD, resting comfortably Cardiovascular: RRR, normal heart sounds Respiratory: CTAB Abdomen: Non-tender to palaption Extremities: Unchanged since yesterday (See imaging in chart) Neuro:  Alert and Oriented x2 to person and place only, follows commands appropriately   Laboratory: Recent Labs  Lab 06/25/21 1655 06/25/21 1735 06/25/21 2052 06/25/21 2232 06/26/21 0144  WBC 36.6*  --   --   --  35.0*  HGB 9.8*   < > 10.2* 9.9* 8.7*  HCT 31.4*   < > 30.0* 29.0* 27.0*  PLT 406*  --   --   --  406*   < > = values in this interval not displayed.   Recent Labs  Lab 06/25/21 1655 06/25/21 1735 06/26/21 0030 06/26/21 0144 06/26/21 0625  NA 123*   < > 129* 129* 133*  K 4.6   < > 3.6 6.1* 3.4*  CL 89*   < > 97* 98 103  CO2 12*   < > 17* 17* 17*  BUN 86*   < > 83* 83* 77*  CREATININE 3.80*   < > 3.18* 3.12* 2.70*  CALCIUM 7.9*   < > 7.5* 7.6* 7.7*  PROT 7.6  --   --  6.9  --  BILITOT 2.1*  --   --  2.5*  --   ALKPHOS 101  --   --  81  --   ALT 20  --   --  18  --   AST 23  --   --  52*  --   GLUCOSE 848*   < > 537* 464* 224*   < > = values in this interval not displayed.      Imaging/Diagnostic Tests: EXAM: CT OF THE LEFT FOOT WITHOUT CONTRAST   TECHNIQUE: Multidetector CT imaging of the left foot was performed according to the standard protocol. Multiplanar CT image reconstructions were also generated.   COMPARISON:  None.   FINDINGS: Study limited by lack of intravenous contrast material.   Bones/Joint/Cartilage   Gas is identified in the marrow space of multiple bones of the midfoot, including the  inferior navicula, the cuboid, and the middle and lateral cuneiform bones. Gas is also seen in the base of the second, third, and fourth metatarsals. This is associated with apparent decreased mineralization in the same regions. Imaging features compatible with osteomyelitis.   Ligaments   Suboptimally assessed by CT.   Muscles and Tendons   Diffuse muscular and subcutaneous edema evident. Overlying skin thickening noted.   Soft tissues   There is diffuse soft tissue gas throughout the left foot extending up into the ankle. Extensive muscular and soft tissue edema. No discrete abscess evident although assessment is substantially limited by the lack of intravenous contrast material.   IMPRESSION: 1. Extensive soft tissue gas in the foot and visualized portion of the ankle. Necrotizing fasciitis is a distinct concern. 2. Gas is identified in the marrow space of multiple bones of the midfoot consistent with associated osteomyelitis. 3. Extensive muscular and soft tissue edema without a discrete fluid collection/abscess evident although assessment is markedly limited by lack of intravenous contrast material. 4. MRI of the foot with and without contrast recommended for more definitive assessment.   These results will be called to the ordering clinician or representative by the Radiologist Assistant, and communication documented in the PACS or [employer].     Electronically Signed   By: Misty Stanley M.D.   On: 06/26/2021 06:52    EXAM: CT OF THE RIGHT FOOT WITHOUT CONTRAST   TECHNIQUE: Multidetector CT imaging of the right foot was performed according to the standard protocol. Multiplanar CT image reconstructions were also generated.   COMPARISON:  None.   FINDINGS: Study limited by lack of intravenous contrast material.   Bones/Joint/Cartilage   No evidence for an acute fracture or dislocation. No gross bony destruction to suggest overt osteomyelitis.  Subtle cortical irregularity noted in the great toe (23/8), nonspecific.   Ligaments   Suboptimally assessed by CT.   Muscles and Tendons   Unremarkable.   Soft tissues   Subcutaneous edema noted, most pronounced in the midfoot and region of the MTP joints. No gas identified within the soft tissues. No gross discrete fluid collection evident.   IMPRESSION: 1. Subcutaneous edema, most pronounced in the midfoot and region of the MTP joints. No gas identified within the soft tissues. No gross discrete fluid collection. 2. No gross bony destruction to suggest overt osteomyelitis. Subtle cortical irregularity in the great toe, nonspecific. 3. If there is clinical concern for osteomyelitis, MRI of the foot with and without contrast recommended to further evaluate. MRI is also more sensitive in the detection of soft tissue abscesses.     Electronically Signed  By: Misty Stanley M.D.   On: 06/26/2021 06:39   Delora Fuel, MD 06/26/2021, 10:06 AM PGY-1, [facility] Intern pager: [phone], text pages welcome

## 2021-06-26 NOTE — Progress Notes (Signed)
EKG completed, MD notified of findings.

## 2021-06-26 NOTE — Consult Note (Signed)
WOC Nurse Consult Note: Consult requested for bilat feet prior to ortho service involvement.  They have assessed necrotic feet and plan for probable surgery; please refer to their team for assessment and plan of care.   Consult requested for bilat buttocks; performed remotely after review of progress notes and the nursing wound flow sheet.  Pt is reported to have red moist macerated skin and moisture associated skin damage.  This can be treated independently by the bedside nurses using the Skin care order set: Topical treatment orders provided to protect skin and repel moisture as follows:  Apply Desitin to bilat buttocks BID and PRN when turning or cleaning. Please re-consult if further assistance is needed.  Thank-you,  Julien Girt MSN, [place], [place], [place], [place]

## 2021-06-26 NOTE — Telephone Encounter (Signed)
Pt was called and a VM was left informing patient of lab results. 

## 2021-06-26 NOTE — Progress Notes (Signed)
FPTS Brief Progress Note  S: Seen patient at bedside this evening. She was sleeping comfortably so I did not wake the patient. MEWS 0.   O: BP (!) 169/81 (BP Location: Right Arm)   Pulse 80   Temp 97.8 F (36.6 C) (Oral)   Resp 20   SpO2 100%    General: sleeping comfortably  A/P: Uncontrolled DM -Monitor CBGs -Continue sliding scale and titrate lantus according to CBGs tomorrow  Left foot necrotizing fasciitis -Continue cefepime, vancomycin and clindamycin -N.p.o. from midnight -Orthopedic surgery tomorrow morning - Orders reviewed. Labs for AM ordered, which was adjusted as needed.   Abnormal troponin, possible ACS No reports of chest pain today.  Initial troponin 70 -Trend troponins until flat  Lattie Haw, MD 06/26/2021, 9:56 PM PGY-3, [facility] Night Resident  Please page [phone] with questions.

## 2021-06-26 NOTE — Progress Notes (Signed)
Pt ate at 0628 and lantus was given.

## 2021-06-26 NOTE — Consult Note (Signed)
Reason for Consult:Diabetic foot ulcers Referring Physician: Chrisandra Netters Time called: 0730 Time at bedside: [place] is an 50 y.o. female.  HPI: Sarah Long was admitted last night in DKA. She was noted to have bilateral foot ulcerations L>R and orthopedic surgery was consulted. She been dealing with the ulcerations for about a month and has been under the care of the wound care clinic. She denies discomfort.  Past Medical History:  Diagnosis Date   Abnormal Pap smear 1998   Abnormal vaginal bleeding 12/19/2011   Acute urinary retention 06/26/2021   Bacterial infection    Bipolar 1 disorder (HCC)    Blister of second toe of left foot 11/21/2016   Candida vaginitis 07/2007   Depression    recently added wellbutrin-has not taken yet for bipolar   Diabetes in pregnancy    Diabetes mellitus    nph 20U qam and qpm, regular with meals   Diabetic ketoacidosis without coma associated with type 2 diabetes mellitus ([facility])    Fibroid    Galactorrhea of right breast 2008   H/O amenorrhea 07/2007   H/O dizziness 10/14/11   H/O dysmenorrhea 2010   H/O menorrhagia 10/14/11   H/O varicella    Headache(784.0)    Heavy vaginal bleeding due to contraceptive injection use 10/12/2011   Depo Provera   Herpes    HSV-2 infection 01/03/2009   Hx: UTI (urinary tract infection) 2009   Hypertension    on aldomet   Increased BMI 2010   Irregular uterine bleeding 04/04/2012   Pt has mirena    Obesity 10/14/11   Oligomenorrhea 07/2007   Pelvic pain in female    Preterm labor    Trichomonas    Yeast infection     Past Surgical History:  Procedure Laterality Date   ABDOMINAL AORTOGRAM W/LOWER EXTREMITY N/A 06/17/2021   Procedure: ABDOMINAL AORTOGRAM W/LOWER EXTREMITY;  Surgeon: Wellington Hampshire, MD;  Location: [facility];  Service: Cardiovascular;  Laterality: N/A;   CESAREAN SECTION  1991   LEFT HEART CATH AND CORONARY ANGIOGRAPHY N/A 02/10/2020   Procedure: LEFT HEART CATH AND CORONARY  ANGIOGRAPHY;  Surgeon: Troy Sine, MD;  Location: [facility];  Service: Cardiovascular;  Laterality: N/A;   PACEMAKER IMPLANT N/A 02/13/2020   Procedure: PACEMAKER IMPLANT;  Surgeon: Constance Haw, MD;  Location: [facility];  Service: Cardiovascular;  Laterality: N/A;   TEMPORARY PACEMAKER N/A 02/10/2020   Procedure: TEMPORARY PACEMAKER;  Surgeon: Troy Sine, MD;  Location: [facility];  Service: Cardiovascular;  Laterality: N/A;    Family History  Problem Relation Age of Onset   Hypertension Mother    Diabetes Mother    Heart disease Mother    Hypertension Father    Diabetes Father    Heart disease Father    Stroke Maternal Grandfather    Other Neg Hx     Social History:  reports that she has quit smoking. Her smoking use included cigarettes. She has a 10.00 pack-year smoking history. She has never used smokeless tobacco. She reports that she does not drink alcohol and does not use drugs.  Allergies:  Allergies  Allergen Reactions   Strawberry Extract Hives   Sulfa Antibiotics Hives and Itching    Medications: I have reviewed the patient's current medications.  Results for orders placed or performed during the hospital encounter of 06/25/21 (from the past 48 hour(s))  CBG monitoring, ED     Status:  Abnormal   Collection Time: 06/25/21  4:48 PM  Result Value Ref Range   Glucose-Capillary >600 (HH) 70 - 99 mg/dL    Comment: Glucose reference range applies only to samples taken after fasting for at least 8 hours.  CBC with Differential/Platelet     Status: Abnormal   Collection Time: 06/25/21  4:55 PM  Result Value Ref Range   WBC 36.6 (H) 4.0 - 10.5 K/uL   RBC 3.44 (L) 3.87 - 5.11 MIL/uL   Hemoglobin 9.8 (L) 12.0 - 15.0 g/dL   HCT 31.4 (L) 36.0 - 46.0 %   MCV 91.3 80.0 - 100.0 fL   MCH 28.5 26.0 - 34.0 pg   MCHC 31.2 30.0 - 36.0 g/dL   RDW 15.6 (H) 11.5 - 15.5 %   Platelets 406 (H) 150 - 400 K/uL   nRBC 0.0 0.0 - 0.2 %   Neutrophils  Relative % 87 %   Neutro Abs 31.6 (H) 1.7 - 7.7 K/uL   Lymphocytes Relative 5 %   Lymphs Abs 2.0 0.7 - 4.0 K/uL   Monocytes Relative 3 %   Monocytes Absolute 1.1 (H) 0.1 - 1.0 K/uL   Eosinophils Relative 0 %   Eosinophils Absolute 0.1 0.0 - 0.5 K/uL   Basophils Relative 0 %   Basophils Absolute 0.1 0.0 - 0.1 K/uL   WBC Morphology MORPHOLOGY UNREMARKABLE    RBC Morphology MORPHOLOGY UNREMARKABLE    Smear Review Normal platelet morphology    Immature Granulocytes 5 %   Abs Immature Granulocytes 1.76 (H) 0.00 - 0.07 K/uL    Comment: Performed at [facility], [address], [place], [place] [address]  Comprehensive metabolic panel     Status: Abnormal   Collection Time: 06/25/21  4:55 PM  Result Value Ref Range   Sodium 123 (L) 135 - 145 mmol/L   Potassium 4.6 3.5 - 5.1 mmol/L   Chloride 89 (L) 98 - 111 mmol/L   CO2 12 (L) 22 - 32 mmol/L   Glucose, Bld 848 (HH) 70 - 99 mg/dL    Comment: Glucose reference range applies only to samples taken after fasting for at least 8 hours. CRITICAL RESULT CALLED TO, READ BACK BY AND VERIFIED WITH: Vita Barley [id number] 06/25/2021 WBOND    BUN 86 (H) 6 - 20 mg/dL   Creatinine, Ser 3.80 (H) 0.44 - 1.00 mg/dL   Calcium 7.9 (L) 8.9 - 10.3 mg/dL   Total Protein 7.6 6.5 - 8.1 g/dL   Albumin 1.5 (L) 3.5 - 5.0 g/dL   AST 23 15 - 41 U/L   ALT 20 0 - 44 U/L   Alkaline Phosphatase 101 38 - 126 U/L   Total Bilirubin 2.1 (H) 0.3 - 1.2 mg/dL   GFR, Estimated 14 (L) >60 mL/min    Comment: (NOTE) Calculated using the CKD-EPI Creatinine Equation (2021)    Anion gap 22 (H) 5 - 15    Comment: Performed at [facility], [place] [address], [place], [place] [address]  Protime-INR     Status: Abnormal   Collection Time: 06/25/21  4:55 PM  Result Value Ref Range   Prothrombin Time 17.9 (H) 11.4 - 15.2 seconds   INR 1.5 (H) 0.8 - 1.2    Comment: (NOTE) INR goal varies based on device and disease states. Performed at [facility], [place] [address] [place], [place] [address]   APTT     Status: None   Collection Time: 06/25/21  4:55  PM  Result Value Ref Range   aPTT 27 24 - 36 seconds    Comment: Performed at [facility], [place] [address] [place], [place] [address]  Lactic acid, plasma     Status: Abnormal   Collection Time: 06/25/21  5:00 PM  Result Value Ref Range   Lactic Acid, Venous 3.4 (HH) 0.5 - 1.9 mmol/L    Comment: CRITICAL RESULT CALLED TO, READ BACK BY AND VERIFIED WITH: Ranae Palms 1850 06/25/2021 WBOND Performed at [facility], [place] [address], [place], [place] [address]   I-stat chem 8, ED (not at [facility] or [facility])     Status: Abnormal   Collection Time: 06/25/21  5:35 PM  Result Value Ref Range   Sodium 123 (L) 135 - 145 mmol/L   Potassium 4.2 3.5 - 5.1 mmol/L   Chloride 92 (L) 98 - 111 mmol/L   BUN 72 (H) 6 - 20 mg/dL   Creatinine, Ser 3.20 (H) 0.44 - 1.00 mg/dL   Glucose, Bld >700 (HH) 70 - 99 mg/dL    Comment: Glucose reference range applies only to samples taken after fasting for at least 8 hours.   Calcium, Ion 0.81 (LL) 1.15 - 1.40 mmol/L   TCO2 14 (L) 22 - 32 mmol/L   Hemoglobin 11.9 (L) 12.0 - 15.0 g/dL   HCT 35.0 (L) 36.0 - 46.0 %   Comment NOTIFIED PHYSICIAN   Beta-hydroxybutyric acid     Status: Abnormal   Collection Time: 06/25/21  5:46 PM  Result Value Ref Range   Beta-Hydroxybutyric Acid 5.37 (H) 0.05 - 0.27 mmol/L    Comment: RESULTS CONFIRMED BY MANUAL DILUTION Performed at [facility] [address], [place],  [address]   CBG monitoring, ED     Status: Abnormal   Collection Time: 06/25/21  6:10 PM  Result Value Ref Range   Glucose-Capillary >600 (HH) 70 - 99 mg/dL    Comment: Glucose reference range applies only to samples taken after fasting for at least 8 hours.  CBG monitoring, ED     Status: Abnormal   Collection Time: 06/25/21  6:47 PM  Result Value Ref Range   Glucose-Capillary >600 (HH) 70 - 99 mg/dL    Comment: Glucose reference range applies only to  samples taken after fasting for at least 8 hours.  CBG monitoring, ED     Status: Abnormal   Collection Time: 06/25/21  7:20 PM  Result Value Ref Range   Glucose-Capillary >600 (HH) 70 - 99 mg/dL    Comment: Glucose reference range applies only to samples taken after fasting for at least 8 hours.   Comment 1 Notify RN   CBG monitoring, ED     Status: Abnormal   Collection Time: 06/25/21  7:52 PM  Result Value Ref Range   Glucose-Capillary >600 (HH) 70 - 99 mg/dL    Comment: Glucose reference range applies only to samples taken after fasting for at least 8 hours.   Comment 1 Notify RN   Resp Panel by RT-PCR (Flu A&B, Covid) Nasopharyngeal Swab     Status: None   Collection Time: 06/25/21  7:57 PM   Specimen: Nasopharyngeal Swab; Nasopharyngeal(NP) swabs in vial transport medium  Result Value Ref Range   SARS Coronavirus 2 by RT PCR NEGATIVE NEGATIVE    Comment: (NOTE) SARS-CoV-2 target nucleic acids are NOT DETECTED.  The SARS-CoV-2 RNA is generally detectable in upper respiratory specimens during the acute phase of infection. The lowest concentration  of SARS-CoV-2 viral copies this assay can detect is 138 copies/mL. A negative result does not preclude SARS-Cov-2 infection and should not be used as the sole basis for treatment or other patient management decisions. A negative result may occur with  improper specimen collection/handling, submission of specimen other than nasopharyngeal swab, presence of viral mutation(s) within the areas targeted by this assay, and inadequate number of viral copies(<138 copies/mL). A negative result must be combined with clinical observations, patient history, and epidemiological information. The expected result is Negative.  Fact Sheet for Patients:  EntrepreneurPulse.com.au  Fact Sheet for Healthcare Providers:  IncredibleEmployment.be  This test is no t yet approved or cleared by the [place] FDA and   has been authorized for detection and/or diagnosis of SARS-CoV-2 by FDA under an Emergency Use Authorization (EUA). This EUA will remain  in effect (meaning this test can be used) for the duration of the COVID-19 declaration under Section 564(b)(1) of the Act, 21 U.S.C.section 360bbb-3(b)(1), unless the authorization is terminated  or revoked sooner.       Influenza A by PCR NEGATIVE NEGATIVE   Influenza B by PCR NEGATIVE NEGATIVE    Comment: (NOTE) The Xpert Xpress SARS-CoV-2/FLU/RSV plus assay is intended as an aid in the diagnosis of influenza from Nasopharyngeal swab specimens and should not be used as a sole basis for treatment. Nasal washings and aspirates are unacceptable for Xpert Xpress SARS-CoV-2/FLU/RSV testing.  Fact Sheet for Patients: EntrepreneurPulse.com.au  Fact Sheet for Healthcare Providers: IncredibleEmployment.be  This test is not yet approved or cleared by the [place] FDA and has been authorized for detection and/or diagnosis of SARS-CoV-2 by FDA under an Emergency Use Authorization (EUA). This EUA will remain in effect (meaning this test can be used) for the duration of the COVID-19 declaration under Section 564(b)(1) of the Act, 21 U.S.C. section 360bbb-3(b)(1), unless the authorization is terminated or revoked.  Performed at [facility], [place] [address], [place], [place] [address]   CBG monitoring, ED     Status: Abnormal   Collection Time: 06/25/21  8:29 PM  Result Value Ref Range   Glucose-Capillary >600 (HH) 70 - 99 mg/dL    Comment: Glucose reference range applies only to samples taken after fasting for at least 8 hours.   Comment 1 Notify RN   CBG monitoring, ED     Status: Abnormal   Collection Time: 06/25/21  8:49 PM  Result Value Ref Range   Glucose-Capillary >600 (HH) 70 - 99 mg/dL    Comment: Glucose reference range applies only to samples taken after fasting for at least 8 hours.    Comment 1 Notify RN   I-Stat venous blood gas, ED     Status: Abnormal   Collection Time: 06/25/21  8:52 PM  Result Value Ref Range   pH, Ven 7.371 7.250 - 7.430   pCO2, Ven 23.7 (L) 44.0 - 60.0 mmHg   pO2, Ven 81.0 (H) 32.0 - 45.0 mmHg   Bicarbonate 13.7 (L) 20.0 - 28.0 mmol/L   TCO2 14 (L) 22 - 32 mmol/L   O2 Saturation 96.0 %   Acid-base deficit 10.0 (H) 0.0 - 2.0 mmol/L   Sodium 130 (L) 135 - 145 mmol/L   Potassium 3.7 3.5 - 5.1 mmol/L   Calcium, Ion 0.97 (L) 1.15 - 1.40 mmol/L   HCT 30.0 (L) 36.0 - 46.0 %   Hemoglobin 10.2 (L) 12.0 - 15.0 g/dL   Sample type VENOUS   Lactic acid, plasma  Status: Abnormal   Collection Time: 06/25/21  8:57 PM  Result Value Ref Range   Lactic Acid, Venous 2.5 (HH) 0.5 - 1.9 mmol/L    Comment: CRITICAL VALUE NOTED.  VALUE IS CONSISTENT WITH PREVIOUSLY REPORTED AND CALLED VALUE. Performed at [facility], [place] [address] [place], [place] [id number]   Basic metabolic panel     Status: Abnormal   Collection Time: 06/25/21  9:21 PM  Result Value Ref Range   Sodium 127 (L) 135 - 145 mmol/L   Potassium 3.8 3.5 - 5.1 mmol/L   Chloride 93 (L) 98 - 111 mmol/L   CO2 14 (L) 22 - 32 mmol/L   Glucose, Bld 699 (HH) 70 - 99 mg/dL    Comment: Glucose reference range applies only to samples taken after fasting for at least 8 hours. CRITICAL RESULT CALLED TO, READ BACK BY AND VERIFIED WITH: Ricky Ala, RN. '@[id number]'$  [id number] BLANKENSHIP R.    BUN 82 (H) 6 - 20 mg/dL   Creatinine, Ser 3.46 (H) 0.44 - 1.00 mg/dL    Comment: DELTA CHECK NOTED   Calcium 7.5 (L) 8.9 - 10.3 mg/dL   GFR, Estimated 16 (L) >60 mL/min    Comment: (NOTE) Calculated using the CKD-EPI Creatinine Equation (2021)    Anion gap 20 (H) 5 - 15    Comment: Performed at [facility] [address], [place], [place] [address]  CBG monitoring, ED     Status: Abnormal   Collection Time: 06/25/21 10:23 PM  Result Value Ref Range   Glucose-Capillary >600 (HH) 70 - 99 mg/dL    Comment:  Glucose reference range applies only to samples taken after fasting for at least 8 hours.   Comment 1 Notify RN   I-Stat arterial blood gas, ED     Status: Abnormal   Collection Time: 06/25/21 10:32 PM  Result Value Ref Range   pH, Arterial 7.454 (H) 7.350 - 7.450   pCO2 arterial 22.6 (L) 32.0 - 48.0 mmHg   pO2, Arterial 89 83.0 - 108.0 mmHg   Bicarbonate 15.9 (L) 20.0 - 28.0 mmol/L   TCO2 17 (L) 22 - 32 mmol/L   O2 Saturation 98.0 %   Acid-base deficit 7.0 (H) 0.0 - 2.0 mmol/L   Sodium 131 (L) 135 - 145 mmol/L   Potassium 3.6 3.5 - 5.1 mmol/L   Calcium, Ion 1.05 (L) 1.15 - 1.40 mmol/L   HCT 29.0 (L) 36.0 - 46.0 %   Hemoglobin 9.9 (L) 12.0 - 15.0 g/dL   Patient temperature 98.3 F    Collection site Radial    Drawn by RT    Sample type ARTERIAL   Lactic acid, plasma     Status: Abnormal   Collection Time: 06/25/21 11:57 PM  Result Value Ref Range   Lactic Acid, Venous 3.8 (HH) 0.5 - 1.9 mmol/L    Comment: CRITICAL VALUE NOTED.  VALUE IS CONSISTENT WITH PREVIOUSLY REPORTED AND CALLED VALUE. Performed at [facility], [place] [address], [place], [place] [address]   CBG monitoring, ED     Status: Abnormal   Collection Time: 06/26/21 12:09 AM  Result Value Ref Range   Glucose-Capillary 506 (HH) 70 - 99 mg/dL    Comment: Glucose reference range applies only to samples taken after fasting for at least 8 hours.   Comment 1 Notify RN   Basic metabolic panel     Status: Abnormal   Collection Time: 06/26/21 12:30 AM  Result Value Ref  Range   Sodium 129 (L) 135 - 145 mmol/L   Potassium 3.6 3.5 - 5.1 mmol/L   Chloride 97 (L) 98 - 111 mmol/L   CO2 17 (L) 22 - 32 mmol/L   Glucose, Bld 537 (HH) 70 - 99 mg/dL    Comment: Glucose reference range applies only to samples taken after fasting for at least 8 hours. CRITICAL RESULT CALLED TO, READ BACK BY AND VERIFIED WITH: IDorris Carnes, RN. '@[id number]'$  [id number] BLANKENSHIP R.     BUN 83 (H) 6 - 20 mg/dL   Creatinine, Ser 3.18 (H) 0.44 - 1.00  mg/dL    Comment: DELTA CHECK NOTED   Calcium 7.5 (L) 8.9 - 10.3 mg/dL   GFR, Estimated 17 (L) >60 mL/min    Comment: (NOTE) Calculated using the CKD-EPI Creatinine Equation (2021)    Anion gap 15 5 - 15    Comment: Performed at [facility] [address], [place], [place] [address]  Glucose, capillary     Status: Abnormal   Collection Time: 06/26/21 12:57 AM  Result Value Ref Range   Glucose-Capillary 479 (H) 70 - 99 mg/dL    Comment: Glucose reference range applies only to samples taken after fasting for at least 8 hours.  Surgical pcr screen     Status: None   Collection Time: 06/26/21  1:17 AM   Specimen: Nasal Mucosa; Nasal Swab  Result Value Ref Range   MRSA, PCR NEGATIVE NEGATIVE   Staphylococcus aureus NEGATIVE NEGATIVE    Comment: (NOTE) The Xpert SA Assay (FDA approved for NASAL specimens in patients 69 years of age and older), is one component of a comprehensive surveillance program. It is not intended to diagnose infection nor to guide or monitor treatment. Performed at [facility], [place] [address], [place], [place] [address]   Beta-hydroxybutyric acid     Status: None   Collection Time: 06/26/21  1:44 AM  Result Value Ref Range   Beta-Hydroxybutyric Acid 0.18 0.05 - 0.27 mmol/L    Comment: Performed at [facility] [address] [place], [place] [address]  Ethanol     Status: None   Collection Time: 06/26/21  1:44 AM  Result Value Ref Range   Alcohol, Ethyl (B) <10 <10 mg/dL    Comment: (NOTE) Lowest detectable limit for serum alcohol is 10 mg/dL.  For medical purposes only. Performed at [facility], [place] [address], [place], [place] [address]   Ammonia     Status: Abnormal   Collection Time: 06/26/21  1:44 AM  Result Value Ref Range   Ammonia 43 (H) 9 - 35 umol/L    Comment: Performed at [facility], [place] [address] [place],  [address]  Comprehensive metabolic panel     Status: Abnormal   Collection Time:  06/26/21  1:44 AM  Result Value Ref Range   Sodium 129 (L) 135 - 145 mmol/L   Potassium 6.1 (H) 3.5 - 5.1 mmol/L   Chloride 98 98 - 111 mmol/L   CO2 17 (L) 22 - 32 mmol/L   Glucose, Bld 464 (H) 70 - 99 mg/dL    Comment: Glucose reference range applies only to samples taken after fasting for at least 8 hours.   BUN 83 (H) 6 - 20 mg/dL   Creatinine, Ser 3.12 (H) 0.44 - 1.00 mg/dL    Comment: DELTA CHECK NOTED   Calcium 7.6 (L) 8.9 - 10.3 mg/dL   Total Protein 6.9 6.5 -  8.1 g/dL   Albumin 1.4 (L) 3.5 - 5.0 g/dL   AST 52 (H) 15 - 41 U/L   ALT 18 0 - 44 U/L   Alkaline Phosphatase 81 38 - 126 U/L   Total Bilirubin 2.5 (H) 0.3 - 1.2 mg/dL   GFR, Estimated 18 (L) >60 mL/min    Comment: (NOTE) Calculated using the CKD-EPI Creatinine Equation (2021)    Anion gap 14 5 - 15    Comment: Performed at [facility] [address] [place], [place] [address]  CBC     Status: Abnormal   Collection Time: 06/26/21  1:44 AM  Result Value Ref Range   WBC 35.0 (H) 4.0 - 10.5 K/uL   RBC 3.14 (L) 3.87 - 5.11 MIL/uL   Hemoglobin 8.7 (L) 12.0 - 15.0 g/dL   HCT 27.0 (L) 36.0 - 46.0 %   MCV 86.0 80.0 - 100.0 fL    Comment: REPEATED TO VERIFY   MCH 27.7 26.0 - 34.0 pg   MCHC 32.2 30.0 - 36.0 g/dL   RDW 14.9 11.5 - 15.5 %   Platelets 406 (H) 150 - 400 K/uL   nRBC 0.0 0.0 - 0.2 %    Comment: Performed at [facility] [address], [place], [place] [address]  Glucose, capillary     Status: Abnormal   Collection Time: 06/26/21  1:58 AM  Result Value Ref Range   Glucose-Capillary 361 (H) 70 - 99 mg/dL    Comment: Glucose reference range applies only to samples taken after fasting for at least 8 hours.  Blood gas, venous     Status: Abnormal   Collection Time: 06/26/21  2:25 AM  Result Value Ref Range   pH, Ven 7.353 7.250 - 7.430   pCO2, Ven 33.3 (L) 44.0 - 60.0 mmHg   pO2, Ven 84.8 (H) 32.0 - 45.0 mmHg   Bicarbonate 18.1 (L) 20.0 - 28.0 mmol/L   Acid-base deficit 6.4 (H) 0.0 - 2.0  mmol/L   O2 Saturation 95.0 %   Patient temperature 37.0    Collection site VENOUS    Drawn by [id number]    Sample type VENOUS     Comment: Performed at [facility] [address], [place], [place] [address]  Glucose, capillary     Status: Abnormal   Collection Time: 06/26/21  3:03 AM  Result Value Ref Range   Glucose-Capillary 378 (H) 70 - 99 mg/dL    Comment: Glucose reference range applies only to samples taken after fasting for at least 8 hours.  Urine rapid drug screen (hosp performed)     Status: None   Collection Time: 06/26/21  3:52 AM  Result Value Ref Range   Opiates NONE DETECTED NONE DETECTED   Cocaine NONE DETECTED NONE DETECTED   Benzodiazepines NONE DETECTED NONE DETECTED   Amphetamines NONE DETECTED NONE DETECTED   Tetrahydrocannabinol NONE DETECTED NONE DETECTED   Barbiturates NONE DETECTED NONE DETECTED    Comment: (NOTE) DRUG SCREEN FOR MEDICAL PURPOSES ONLY.  IF CONFIRMATION IS NEEDED FOR ANY PURPOSE, NOTIFY LAB WITHIN 5 DAYS.  LOWEST DETECTABLE LIMITS FOR URINE DRUG SCREEN Drug Class                     Cutoff (ng/mL) Amphetamine and metabolites    1000 Barbiturate and metabolites    200 Benzodiazepine                 [id number] Tricyclics and metabolites  300 Opiates and metabolites        300 Cocaine and metabolites        300 THC                            50 Performed at [facility], [place] [address] [place], [place] [address]   Glucose, capillary     Status: Abnormal   Collection Time: 06/26/21  4:03 AM  Result Value Ref Range   Glucose-Capillary 302 (H) 70 - 99 mg/dL    Comment: Glucose reference range applies only to samples taken after fasting for at least 8 hours.  Glucose, capillary     Status: Abnormal   Collection Time: 06/26/21  5:03 AM  Result Value Ref Range   Glucose-Capillary 228 (H) 70 - 99 mg/dL    Comment: Glucose reference range applies only to samples taken after fasting for at least 8 hours.  Glucose, capillary      Status: Abnormal   Collection Time: 06/26/21  6:18 AM  Result Value Ref Range   Glucose-Capillary 273 (H) 70 - 99 mg/dL    Comment: Glucose reference range applies only to samples taken after fasting for at least 8 hours.  Magnesium     Status: None   Collection Time: 06/26/21  6:25 AM  Result Value Ref Range   Magnesium 2.0 1.7 - 2.4 mg/dL    Comment: Performed at [facility], [place] [address], [place], [place] [id number]  Basic metabolic panel     Status: Abnormal   Collection Time: 06/26/21  6:25 AM  Result Value Ref Range   Sodium 133 (L) 135 - 145 mmol/L   Potassium 3.4 (L) 3.5 - 5.1 mmol/L   Chloride 103 98 - 111 mmol/L   CO2 17 (L) 22 - 32 mmol/L   Glucose, Bld 224 (H) 70 - 99 mg/dL    Comment: Glucose reference range applies only to samples taken after fasting for at least 8 hours.   BUN 77 (H) 6 - 20 mg/dL   Creatinine, Ser 2.70 (H) 0.44 - 1.00 mg/dL   Calcium 7.7 (L) 8.9 - 10.3 mg/dL   GFR, Estimated 21 (L) >60 mL/min    Comment: (NOTE) Calculated using the CKD-EPI Creatinine Equation (2021)    Anion gap 13 5 - 15    Comment: Performed at [facility] [address] [place], [place] [address]  CK     Status: None   Collection Time: 06/26/21  6:25 AM  Result Value Ref Range   Total CK 110 38 - 234 U/L    Comment: Performed at [facility], [place] [address], [place], [place] [address]  Glucose, capillary     Status: Abnormal   Collection Time: 06/26/21  7:25 AM  Result Value Ref Range   Glucose-Capillary 180 (H) 70 - 99 mg/dL    Comment: Glucose reference range applies only to samples taken after fasting for at least 8 hours.    CT HEAD WO CONTRAST  Result Date: 06/25/2021 CLINICAL DATA:  Mental status change, alcohol/drug use. EXAM: CT HEAD WITHOUT CONTRAST TECHNIQUE: Contiguous axial images were obtained from the base of the skull through the vertex without intravenous contrast. COMPARISON:  CT head 01/03/2021 FINDINGS: Brain: No evidence of  large-territorial acute infarction. No parenchymal hemorrhage. No mass lesion. No extra-axial collection. No mass effect or midline shift. No hydrocephalus. Basilar cisterns are patent. Vascular: No  hyperdense vessel. Skull: No acute fracture or focal lesion. Sinuses/Orbits: Right maxillary sinus mucosal thickening. Otherwise remaining visualized paranasal sinuses and mastoid air cells are clear. The orbits are unremarkable. Other: None. IMPRESSION: No acute intracranial abnormality. Electronically Signed   By: Iven Finn M.D.   On: 06/25/2021 21:59   CT FOOT LEFT WO CONTRAST  Result Date: 06/26/2021 CLINICAL DATA:  Foot swelling. EXAM: CT OF THE LEFT FOOT WITHOUT CONTRAST TECHNIQUE: Multidetector CT imaging of the left foot was performed according to the standard protocol. Multiplanar CT image reconstructions were also generated. COMPARISON:  None. FINDINGS: Study limited by lack of intravenous contrast material. Bones/Joint/Cartilage Gas is identified in the marrow space of multiple bones of the midfoot, including the inferior navicula, the cuboid, and the middle and lateral cuneiform bones. Gas is also seen in the base of the second, third, and fourth metatarsals. This is associated with apparent decreased mineralization in the same regions. Imaging features compatible with osteomyelitis. Ligaments Suboptimally assessed by CT. Muscles and Tendons Diffuse muscular and subcutaneous edema evident. Overlying skin thickening noted. Soft tissues There is diffuse soft tissue gas throughout the left foot extending up into the ankle. Extensive muscular and soft tissue edema. No discrete abscess evident although assessment is substantially limited by the lack of intravenous contrast material. IMPRESSION: 1. Extensive soft tissue gas in the foot and visualized portion of the ankle. Necrotizing fasciitis is a distinct concern. 2. Gas is identified in the marrow space of multiple bones of the midfoot consistent with  associated osteomyelitis. 3. Extensive muscular and soft tissue edema without a discrete fluid collection/abscess evident although assessment is markedly limited by lack of intravenous contrast material. 4. MRI of the foot with and without contrast recommended for more definitive assessment. These results will be called to the ordering clinician or representative by the Radiologist Assistant, and communication documented in the PACS or [employer]. Electronically Signed   By: Misty Stanley M.D.   On: 06/26/2021 06:52   CT FOOT RIGHT WO CONTRAST  Result Date: 06/26/2021 CLINICAL DATA:  Foot swelling. EXAM: CT OF THE RIGHT FOOT WITHOUT CONTRAST TECHNIQUE: Multidetector CT imaging of the right foot was performed according to the standard protocol. Multiplanar CT image reconstructions were also generated. COMPARISON:  None. FINDINGS: Study limited by lack of intravenous contrast material. Bones/Joint/Cartilage No evidence for an acute fracture or dislocation. No gross bony destruction to suggest overt osteomyelitis. Subtle cortical irregularity noted in the great toe (23/8), nonspecific. Ligaments Suboptimally assessed by CT. Muscles and Tendons Unremarkable. Soft tissues Subcutaneous edema noted, most pronounced in the midfoot and region of the MTP joints. No gas identified within the soft tissues. No gross discrete fluid collection evident. IMPRESSION: 1. Subcutaneous edema, most pronounced in the midfoot and region of the MTP joints. No gas identified within the soft tissues. No gross discrete fluid collection. 2. No gross bony destruction to suggest overt osteomyelitis. Subtle cortical irregularity in the great toe, nonspecific. 3. If there is clinical concern for osteomyelitis, MRI of the foot with and without contrast recommended to further evaluate. MRI is also more sensitive in the detection of soft tissue abscesses. Electronically Signed   By: Misty Stanley M.D.   On: 06/26/2021 06:39   DG Chest Port  1 View  Result Date: 06/25/2021 CLINICAL DATA:  Sepsis, hyperglycemia EXAM: PORTABLE CHEST 1 VIEW COMPARISON:  03/29/2021 FINDINGS: Single frontal view of the chest demonstrates stable dual lead pacer. Cardiac silhouette is enlarged but stable. No airspace disease, effusion, or  pneumothorax. No acute bony abnormalities. IMPRESSION: 1. No acute intrathoracic process. Electronically Signed   By: Randa Ngo M.D.   On: 06/25/2021 18:20   DG Foot Complete Left  Result Date: 06/25/2021 CLINICAL DATA:  Hyperglycemia, question sepsis. Gangrenous left foot. EXAM: LEFT FOOT - COMPLETE 3+ VIEW COMPARISON:  None. FINDINGS: No cortical erosion or destruction. There is no evidence of fracture or dislocation. There is no evidence of arthropathy or other focal bone abnormality. Diffuse marked subcutaneus soft tissue edema and emphysema. Vascular calcifications. IMPRESSION: 1. Diffuse marked subcutaneus soft tissue edema and emphysema. Correlate clinically for necrotizing fasciitis. 2. No radiographic findings to suggest osteomyelitis. 3.  No acute displaced fracture or dislocation. Electronically Signed   By: Iven Finn M.D.   On: 06/25/2021 18:20    Review of Systems  HENT:  Negative for ear discharge, ear pain, hearing loss and tinnitus.   Eyes:  Negative for photophobia and pain.  Respiratory:  Negative for cough and shortness of breath.   Cardiovascular:  Negative for chest pain.  Gastrointestinal:  Negative for abdominal pain, nausea and vomiting.  Genitourinary:  Negative for dysuria, flank pain, frequency and urgency.  Musculoskeletal:  Negative for arthralgias, back pain, myalgias and neck pain.  Skin:  Positive for wound (Bilateral feet).  Neurological:  Negative for dizziness and headaches.  Hematological:  Does not bruise/bleed easily.  Psychiatric/Behavioral:  The patient is not nervous/anxious.   Blood pressure (!) 117/59, pulse 81, temperature 97.7 F (36.5 C), temperature source Oral,  resp. rate 20, SpO2 100 %. Physical Exam Constitutional:      General: She is not in acute distress.    Appearance: She is well-developed. She is not diaphoretic.  HENT:     Head: Normocephalic and atraumatic.  Eyes:     General: No scleral icterus.       Right eye: No discharge.        Left eye: No discharge.     Conjunctiva/sclera: Conjunctivae normal.  Cardiovascular:     Rate and Rhythm: Normal rate and regular rhythm.  Pulmonary:     Effort: Pulmonary effort is normal. No respiratory distress.  Musculoskeletal:     Cervical back: Normal range of motion.  Feet:     Comments: Right foot: Fusiform edema great toe, 1+ NP edema foot, SPN/DPN/TN intact, 1+ DP, 0 PT  Left foot: Gangrene all toes, 3+ pitting edema entire foot with SQE and free-flowing fluid, 0 DP, 0 PT, SPN/DPN/TN intact Skin:    General: Skin is warm and dry.  Neurological:     Mental Status: She is alert.  Psychiatric:        Mood and Affect: Mood normal.        Behavior: Behavior normal.    Assessment/Plan: Diabetic foot ulcerations -- Will need BKA on left but may need revascularization first, will see what ABI's look like. Dr. Sharol Given to evaluate. Keep NPO for now but doubt surgery will be today. Right foot is not as bad but may still need great toe amputation. Do not think left foot MRI will change anything so will cancel that.    Lisette Abu, PA-C Orthopedic Surgery [phone] 06/26/2021, 8:32 AM

## 2021-06-26 NOTE — Progress Notes (Signed)
Pt has arrived to [address], Alert and oriented to self only. Pt identified appropriately. VS stable, denied SOB and chest pain. Pt in no acute distress. Pt connected to bedside monitor and CCMD notified. Oriented to room and equipment, but does not seem that pt completely comprehended. Unable to obtain history due to pt mental status.  Bed alarm in place and call bell left within pt reach. Pt instructed to call for assistance. Will continue to monitor pt and treat per MD orders.

## 2021-06-26 NOTE — H&P (View-Only) (Signed)
ORTHOPAEDIC CONSULTATION  REQUESTING PHYSICIAN: Leeanne Rio, MD  Chief Complaint: Patient reports a several week history of pain ulceration and gangrenous changes to the left foot.  HPI: Sarah Long is a 50 y.o. female who presents with uncontrolled type 2 diabetes severe protein caloric malnutrition and diabetic peripheral vascular disease.  She states she has had over 3-week history of painful necrotic changes to the toes on her left foot.  She underwent a arteriogram study of the left lower extremity 1 week ago.  Patient states that she has 3 children that she cares for.  Past Medical History:  Diagnosis Date   Abnormal Pap smear 1998   Abnormal vaginal bleeding 12/19/2011   Acute urinary retention 06/26/2021   Bacterial infection    Bipolar 1 disorder (HCC)    Blister of second toe of left foot 11/21/2016   Candida vaginitis 07/2007   Depression    recently added wellbutrin-has not taken yet for bipolar   Diabetes in pregnancy    Diabetes mellitus    nph 20U qam and qpm, regular with meals   Diabetic ketoacidosis without coma associated with type 2 diabetes mellitus ([place])    Fibroid    Galactorrhea of right breast 2008   H/O amenorrhea 07/2007   H/O dizziness 10/14/11   H/O dysmenorrhea 2010   H/O menorrhagia 10/14/11   H/O varicella    Headache(784.0)    Heavy vaginal bleeding due to contraceptive injection use 10/12/2011   Depo Provera   Herpes    HSV-2 infection 01/03/2009   Hx: UTI (urinary tract infection) 2009   Hypertension    on aldomet   Increased BMI 2010   Irregular uterine bleeding 04/04/2012   Pt has mirena    Obesity 10/14/11   Oligomenorrhea 07/2007   Pelvic pain in female    Preterm labor    Trichomonas    Yeast infection    Past Surgical History:  Procedure Laterality Date   ABDOMINAL AORTOGRAM W/LOWER EXTREMITY N/A 06/17/2021   Procedure: ABDOMINAL AORTOGRAM W/LOWER EXTREMITY;  Surgeon: Wellington Hampshire, MD;  Location: [facility];   Service: Cardiovascular;  Laterality: N/A;   CESAREAN SECTION  1991   LEFT HEART CATH AND CORONARY ANGIOGRAPHY N/A 02/10/2020   Procedure: LEFT HEART CATH AND CORONARY ANGIOGRAPHY;  Surgeon: Troy Sine, MD;  Location: [facility];  Service: Cardiovascular;  Laterality: N/A;   PACEMAKER IMPLANT N/A 02/13/2020   Procedure: PACEMAKER IMPLANT;  Surgeon: Constance Haw, MD;  Location: [facility];  Service: Cardiovascular;  Laterality: N/A;   TEMPORARY PACEMAKER N/A 02/10/2020   Procedure: TEMPORARY PACEMAKER;  Surgeon: Troy Sine, MD;  Location: [facility];  Service: Cardiovascular;  Laterality: N/A;   Social History   Socioeconomic History   Marital status: Married    Spouse name: Not on file   Number of children: Not on file   Years of education: Not on file   Highest education level: Not on file  Occupational History   Not on file  Tobacco Use   Smoking status: Former    Packs/day: 0.50    Years: 20.00    Pack years: 10.00    Types: Cigarettes   Smokeless tobacco: Never  Vaping Use   Vaping Use: Never used  Substance and Sexual Activity   Alcohol use: No   Drug use: No   Sexual activity: Yes    Birth control/protection: None  Other Topics Concern  Not on file  Social History Narrative   Not on file   Social Determinants of Health   Financial Resource Strain: Not on file  Food Insecurity: Not on file  Transportation Needs: Not on file  Physical Activity: Not on file  Stress: Not on file  Social Connections: Not on file   Family History  Problem Relation Age of Onset   Hypertension Mother    Diabetes Mother    Heart disease Mother    Hypertension Father    Diabetes Father    Heart disease Father    Stroke Maternal Grandfather    Other Neg Hx    - negative except otherwise stated in the family history section Allergies  Allergen Reactions   Strawberry Extract Hives   Sulfa Antibiotics Hives and Itching   Prior to Admission  medications   Medication Sig Start Date End Date Taking? Authorizing Provider  Accu-Chek Softclix Lancets lancets Use as instructed to check blood sugar three times daily. E11.65 06/18/21  Yes [name], MD  acetaminophen (TYLENOL) 650 MG CR tablet Take 1,950 mg by mouth daily as needed for pain.   Yes [provider]  Blood Glucose Monitoring Suppl (ACCU-CHEK GUIDE) w/Device KIT Use as directed daily 05/26/21  Yes Fenton Foy, NP  chlorthalidone (HYGROTON) 25 MG tablet Take 1 tablet (25 mg total) by mouth daily. 06/18/21  Yes [name], MD  gabapentin (NEURONTIN) 300 MG capsule Take 2 capsules (600 mg total) by mouth 3 (three) times daily. 06/18/21  Yes [name], MD  glucose blood (ACCU-CHEK GUIDE) test strip Use as instructed to check blood sugar three times daily. E11.65 06/18/21  Yes [name], MD  insulin aspart (NOVOLOG FLEXPEN) 100 UNIT/ML FlexPen Inject 10-20 Units into the skin 3 (three) times daily with meals. 10 unit with meals Sliding scale three times daily: 0-150: 0 151-200: 2 units  201-250: 4 units  251-300: 6 units  301-350: 8 units  351- 400 10 units   >400  12 units 06/18/21  Yes [name], MD  insulin glargine (LANTUS SOLOSTAR) 100 UNIT/ML Solostar Pen Inject 40 Units into the skin at bedtime. 06/18/21  Yes [name], MD  Insulin Pen Needle (B-D ULTRAFINE III SHORT PEN) 31G X 8 MM MISC use as directed to inject insulin 06/18/21  Yes [name], MD  Lancet Devices (ADJUSTABLE LANCING DEVICE) MISC Inject 1 each into the skin 3 (three) times daily. 05/26/21  Yes Fenton Foy, NP  lurasidone (LATUDA) 40 MG TABS tablet Take 1 tablet by mouth every evening with meals to start on or after 04/30/21 Patient taking differently: Take 40 mg by mouth every evening. 04/16/21  Yes   Multiple Vitamins-Minerals (ADULT ONE DAILY GUMMIES PO) Take 1 capsule by mouth daily.   Yes [provider]  mupirocin ointment (BACTROBAN) 2 %  APPLY A THIN film OF OINTMENT to the open wounds on your toes every other day x 30 days or until healed Patient taking differently: Apply 1 application topically every other day. 05/27/21  Yes   Pancrelipase, Lip-Prot-Amyl, (ZENPEP) 20000-63000 units CPEP Take 1 capsule by mouth 3 (three) times daily before meals. 06/18/21  Yes [name], MD  rosuvastatin (CRESTOR) 10 MG tablet Take 1 tablet (10 mg total) by mouth daily. 06/18/21 09/16/21 Yes [name], MD   CT HEAD WO CONTRAST  Result Date: 06/25/2021 CLINICAL DATA:  Mental status change, alcohol/drug use. EXAM: CT HEAD WITHOUT  CONTRAST TECHNIQUE: Contiguous axial images were obtained from the base of the skull through the vertex without intravenous contrast. COMPARISON:  CT head 01/03/2021 FINDINGS: Brain: No evidence of large-territorial acute infarction. No parenchymal hemorrhage. No mass lesion. No extra-axial collection. No mass effect or midline shift. No hydrocephalus. Basilar cisterns are patent. Vascular: No hyperdense vessel. Skull: No acute fracture or focal lesion. Sinuses/Orbits: Right maxillary sinus mucosal thickening. Otherwise remaining visualized paranasal sinuses and mastoid air cells are clear. The orbits are unremarkable. Other: None. IMPRESSION: No acute intracranial abnormality. Electronically Signed   By: Iven Finn M.D.   On: 06/25/2021 21:59   CT FOOT LEFT WO CONTRAST  Result Date: 06/26/2021 CLINICAL DATA:  Foot swelling. EXAM: CT OF THE LEFT FOOT WITHOUT CONTRAST TECHNIQUE: Multidetector CT imaging of the left foot was performed according to the standard protocol. Multiplanar CT image reconstructions were also generated. COMPARISON:  None. FINDINGS: Study limited by lack of intravenous contrast material. Bones/Joint/Cartilage Gas is identified in the marrow space of multiple bones of the midfoot, including the inferior navicula, the cuboid, and the middle and lateral cuneiform bones. Gas is also seen in the  base of the second, third, and fourth metatarsals. This is associated with apparent decreased mineralization in the same regions. Imaging features compatible with osteomyelitis. Ligaments Suboptimally assessed by CT. Muscles and Tendons Diffuse muscular and subcutaneous edema evident. Overlying skin thickening noted. Soft tissues There is diffuse soft tissue gas throughout the left foot extending up into the ankle. Extensive muscular and soft tissue edema. No discrete abscess evident although assessment is substantially limited by the lack of intravenous contrast material. IMPRESSION: 1. Extensive soft tissue gas in the foot and visualized portion of the ankle. Necrotizing fasciitis is a distinct concern. 2. Gas is identified in the marrow space of multiple bones of the midfoot consistent with associated osteomyelitis. 3. Extensive muscular and soft tissue edema without a discrete fluid collection/abscess evident although assessment is markedly limited by lack of intravenous contrast material. 4. MRI of the foot with and without contrast recommended for more definitive assessment. These results will be called to the ordering clinician or representative by the Radiologist Assistant, and communication documented in the PACS or [employer]. Electronically Signed   By: Misty Stanley M.D.   On: 06/26/2021 06:52   CT FOOT RIGHT WO CONTRAST  Result Date: 06/26/2021 CLINICAL DATA:  Foot swelling. EXAM: CT OF THE RIGHT FOOT WITHOUT CONTRAST TECHNIQUE: Multidetector CT imaging of the right foot was performed according to the standard protocol. Multiplanar CT image reconstructions were also generated. COMPARISON:  None. FINDINGS: Study limited by lack of intravenous contrast material. Bones/Joint/Cartilage No evidence for an acute fracture or dislocation. No gross bony destruction to suggest overt osteomyelitis. Subtle cortical irregularity noted in the great toe (23/8), nonspecific. Ligaments Suboptimally assessed by  CT. Muscles and Tendons Unremarkable. Soft tissues Subcutaneous edema noted, most pronounced in the midfoot and region of the MTP joints. No gas identified within the soft tissues. No gross discrete fluid collection evident. IMPRESSION: 1. Subcutaneous edema, most pronounced in the midfoot and region of the MTP joints. No gas identified within the soft tissues. No gross discrete fluid collection. 2. No gross bony destruction to suggest overt osteomyelitis. Subtle cortical irregularity in the great toe, nonspecific. 3. If there is clinical concern for osteomyelitis, MRI of the foot with and without contrast recommended to further evaluate. MRI is also more sensitive in the detection of soft tissue abscesses. Electronically Signed   By: Randall Hiss  Tery Sanfilippo M.D.   On: 06/26/2021 06:39   MR FOOT RIGHT WO CONTRAST  Result Date: 06/26/2021 CLINICAL DATA:  Foot swelling, diabetic, osteomyelitis suspected, no prior imaging EXAM: MRI OF THE RIGHT FOREFOOT WITHOUT CONTRAST TECHNIQUE: Multiplanar, multisequence MR imaging of the right forefoot was performed. No intravenous contrast was administered. COMPARISON:  None. FINDINGS: Bones/Joint/Cartilage There is increased STIR signal throughout the great toe distal phalanx and partially within the proximal phalanx. There is some low T1 signal but not confluent. Additionally, there is edema signal throughout the middle cuneiform without visible fracture line. Ligaments Intact Lisfranc ligament.  No evidence of plantar plate tear Muscles and Tendons No significant muscle atrophy. Intramuscular edema within the intrinsic foot musculature, commonly seen in diabetics. Soft tissues Mild dorsal soft tissue swelling. There is swelling and possible ulcer along the medial aspect of the great toe. IMPRESSION: Edema signal with mild low T1 signal within the great toe distal phalanx and partially within the proximal phalanx, with possible adjacent soft tissue ulcer. Signal characteristics are  compatible with either early osteomyelitis or reactive marrow signal change. Bony edema throughout the middle cuneiform without distinct fracture or confluent low T1 signal to suggest infection. This is favored to represent reactive marrow signal change. Electronically Signed   By: Maurine Simmering   On: 06/26/2021 15:58   DG Chest Port 1 View  Result Date: 06/25/2021 CLINICAL DATA:  Sepsis, hyperglycemia EXAM: PORTABLE CHEST 1 VIEW COMPARISON:  03/29/2021 FINDINGS: Single frontal view of the chest demonstrates stable dual lead pacer. Cardiac silhouette is enlarged but stable. No airspace disease, effusion, or pneumothorax. No acute bony abnormalities. IMPRESSION: 1. No acute intrathoracic process. Electronically Signed   By: Randa Ngo M.D.   On: 06/25/2021 18:20   DG Foot Complete Left  Result Date: 06/25/2021 CLINICAL DATA:  Hyperglycemia, question sepsis. Gangrenous left foot. EXAM: LEFT FOOT - COMPLETE 3+ VIEW COMPARISON:  None. FINDINGS: No cortical erosion or destruction. There is no evidence of fracture or dislocation. There is no evidence of arthropathy or other focal bone abnormality. Diffuse marked subcutaneus soft tissue edema and emphysema. Vascular calcifications. IMPRESSION: 1. Diffuse marked subcutaneus soft tissue edema and emphysema. Correlate clinically for necrotizing fasciitis. 2. No radiographic findings to suggest osteomyelitis. 3.  No acute displaced fracture or dislocation. Electronically Signed   By: Iven Finn M.D.   On: 06/25/2021 18:20   - pertinent xrays, CT, MRI studies were reviewed and independently interpreted  Positive ROS: All other systems have been reviewed and were otherwise negative with the exception of those mentioned in the HPI and as above.  Physical Exam: General: Alert, no acute distress Psychiatric: Patient is competent for consent with normal mood and affect Lymphatic: No axillary or cervical lymphadenopathy Cardiovascular: No pedal  edema Respiratory: No cyanosis, no use of accessory musculature GI: No organomegaly, abdomen is soft and non-tender    Images:  $Remove'@ENCIMAGES'[id number]$ @  Labs:  Lab Results  Component Value Date   HGBA1C 13.6 (A) 06/18/2021   HGBA1C 8.4 (A) 05/15/2020   HGBA1C 12.7 (H) 02/11/2020   LABURIC 4.1 10/21/2016   LABURIC 4.2 11/20/2010   REPTSTATUS 05/21/2021 FINAL 05/19/2021   GRAMSTAIN  02/13/2013    RARE WBC PRESENT, PREDOMINANTLY MONONUCLEAR RARE SQUAMOUS EPITHELIAL CELLS PRESENT RARE GRAM POSITIVE COCCI IN CLUSTERS IN PAIRS   CULT >=100,000 COLONIES/mL KLEBSIELLA PNEUMONIAE (A) 05/19/2021   LABORGA KLEBSIELLA PNEUMONIAE (A) 05/19/2021    Lab Results  Component Value Date   ALBUMIN 1.4 (L) 06/26/2021   ALBUMIN 1.5 (  L) 06/25/2021   ALBUMIN 3.2 (L) 06/18/2021   LABURIC 4.1 10/21/2016   LABURIC 4.2 11/20/2010     CBC EXTENDED Latest Ref Rng & Units 06/26/2021 06/26/2021 06/25/2021  WBC 4.0 - 10.5 K/uL 42.7(H) 35.0(H) -  RBC 3.87 - 5.11 MIL/uL 3.13(L) 3.14(L) -  HGB 12.0 - 15.0 g/dL 8.8(L) 8.7(L) 9.9(L)  HCT 36.0 - 46.0 % 27.4(L) 27.0(L) 29.0(L)  PLT 150 - 400 K/uL 404(H) 406(H) -  NEUTROABS 1.7 - 7.7 K/uL 36.2(H) - -  LYMPHSABS 0.7 - 4.0 K/uL 2.7 - -    Neurologic: Patient does not have protective sensation bilateral lower extremities.   MUSCULOSKELETAL:   Skin: Examination patient has dry gangrene of the forefoot.  She has blistering and ulceration that extends up to the ankle.  There is no crepitation in the calf but her calf is tender to palpation there is no ascending cellulitis past the ankle.  Patient has a hemoglobin A1c of 13.6 and albumin of 1.4.  White cell count 42.7 with a hemoglobin of 8.8.  1 week ago patient did undergo arteriogram study of the left lower extremity.  Studies showed inline flow all 3 vessels to the ankle.  Patient has mild ischemic changes to the toes on the right foot.  Assessment: Assessment: Uncontrolled type 2 diabetes with severe protein  caloric malnutrition gangrene of the left forefoot and abscess and ulceration extending up to the ankle.  Mild ischemic changes to the toes on the right foot.  Plan: We will plan for a left below the knee amputation tomorrow Saturday.  I will type and cross and transfuse with 1 unit packed red blood cells prior to surgery.  Thank you for the consult and the opportunity to see Ms. Evern Bio, [place] [phone] 5:28 PM

## 2021-06-26 NOTE — Progress Notes (Signed)
FPTS Brief Progress Note  S: Ms. Fillyaw was laying awake in bed.  She says she is doing much better.  She does not recall seeing [place] last night during admission.   O: BP 131/64   Pulse 79   Temp 98.2 F (36.8 C) (Oral)   Resp (!) 23   SpO2 100%   General: awake. Laying in bed, NAD Respiratory: Non labored breathing Psych: patient appears less confused, oriented x4   A/P: DKA 2/2 uncontrolled T2DM -continue vitals per floor routine -continue BMP check -check CBG hourly -Trend Lactic  acid -stopped endotool  Necrotizing Fasciitis -Ortho consulted, appreciate recs -Continue DKA treatment -F/u blood culture -continue cefepime 2g daily -Continue clindamycin '600mg'$  IV q8h -Vancomycin daily  AKI 2/2 to DKA - Continue normal saline -avoid nephrotoxic agents -Monitor BMP   Alen Bleacher, MD 06/26/2021, 6:22 AM PGY-1, [facility] Night Resident  Please page [phone] with questions.

## 2021-06-26 NOTE — Progress Notes (Signed)
New order for in and out catheterization placed by MD for urinary retention. Pt attempted to urinate multiple times and was not successful.  Procedure done in 2 west 05 at the bedside at [facility] by Lorenso Courier, RN assisted by Crissie Figures, RN and Katha Cabal, NT.  Procedure explained to pt and pt agreeable. Peri care performed and sterile technique maintained throughout procedure.  1940m of clear urine collected and pt tolerated procedure well. At this time pt is at sleep, no distress noted. Will continue to monitor pt.

## 2021-06-26 NOTE — Telephone Encounter (Signed)
-----   Message from Sarah Stain, MD sent at 06/22/2021 12:18 PM EDT ----- Let pt know :  liver is normal .  Kidney shows reduced function and low sodium level due to high blood sugar.  Take new insulin dosing as prescribed.   She has protein in urine from high blood sugar.  Keep follow up appts.  Cholesterol is at goal

## 2021-06-26 NOTE — Progress Notes (Signed)
BMP resulted, MD on call notified.

## 2021-06-26 NOTE — Evaluation (Signed)
Occupational Therapy Evaluation Patient Details Name: Sarah Long MRN: [id number] DOB: 06-07-1971 Today's Date: 06/26/2021    History of Present Illness Pt is a 50 y/o female admitted secondary to worsening fatigue, polydipsia and nausea in the setting of DKA. Pt admitted due to necrotizing fasciitis of L foot, uncontrolled DM2 and metabolic encephalopathy. PMH but not limited to: T2DM, HTN, HLD, CHF, pancreatitis, hx of ETOh abuse, pacemaker implant (2021), bipolar and depression   Clinical Impression   Pt presented in bed with HOB elevated and agreed to session. Pt at PLOF reported they live in a handicap/ADA hotel room with two children. Pt in session expressed fear about LLE and what is going to happen next. Pt in session attempted to keep LLE NWB as possibly to undergo  transtibial amputation per ortho note. However, in the session with sit to stand transfers from an elevated surface, RW and min x2 assist had difficulties to complete at this time.  Pt when in a static standing position had increase in ability to complete with RW. Pt currently with functional limitations due to the deficits listed below (see OT Problem List).  Pt will benefit from skilled OT to increase their safety and independence with ADL and functional mobility for ADL to facilitate discharge to venue listed below.      Follow Up Recommendations  SNF;Supervision/Assistance - 24 hour    Equipment Recommendations  3 in 1 bedside commode;Tub/shower seat    Recommendations for Other Services       Precautions / Restrictions Precautions Precautions: Fall Restrictions Other Position/Activity Restrictions: no current WB'ing orders, per ortho likely BKA needed. attempted to maintain [place] L LE throughout      Mobility Bed Mobility Overal bed mobility: Needs Assistance Bed Mobility: Supine to Sit;Sit to Supine     Supine to sit: Min assist Sit to supine: Min guard   General bed mobility comments: increased  time and effort needed, HOB elevated, use of bed rails, assistance needed for trunk elevation    Transfers Overall transfer level: Needs assistance Equipment used: Rolling walker (2 wheeled) Transfers: Sit to/from [facility] Sit to Stand: Min assist;+2 safety/equipment;+2 physical assistance;From elevated surface Stand pivot transfers: Min assist;+2 safety/equipment;+2 physical assistance       General transfer comment: pt required multimodal cueing for appropriate hand placement and technique, frequent reminders to keep her L foot off of the ground (unable to do)    Balance Overall balance assessment: Needs assistance Sitting-balance support: Feet supported Sitting balance-Leahy Scale: Good     Standing balance support: During functional activity;Bilateral upper extremity supported;Single extremity supported Standing balance-Leahy Scale: Poor                             ADL either performed or assessed with clinical judgement   ADL Overall ADL's : Needs assistance/impaired Eating/Feeding: Independent;Sitting   Grooming: Wash/dry hands;Cueing for safety;Cueing for sequencing;Sitting;Supervision/safety   Upper Body Bathing: Supervision/ safety;Cueing for safety;Cueing for sequencing;Sitting   Lower Body Bathing: Cueing for safety;Cueing for sequencing;Moderate assistance;Sitting/lateral leans   Upper Body Dressing : Supervision/safety;Sitting;Cueing for safety;Cueing for sequencing   Lower Body Dressing: Moderate assistance;Cueing for safety;Cueing for sequencing;Bed level;Sitting/lateral leans   Toilet Transfer: Minimal assistance;+2 for physical assistance;+2 for safety/equipment;RW;BSC   Toileting- Clothing Manipulation and Hygiene: Total assistance;Cueing for safety;Cueing for sequencing;Sit to/from stand       Functional mobility during ADLs: Minimal assistance;+2 for physical assistance;+2 for safety/equipment;Rolling walker  Vision Baseline Vision/History: Wears glasses Wears Glasses: Reading only       Perception     Praxis      Pertinent Vitals/Pain Pain Assessment: Faces Faces Pain Scale: Hurts even more Pain Location: R LE, L LE with movements but denying pain Pain Descriptors / Indicators: Grimacing Pain Intervention(s): Limited activity within patient's tolerance     Hand Dominance Right   Extremity/Trunk Assessment Upper Extremity Assessment Upper Extremity Assessment: Generalized weakness   Lower Extremity Assessment Lower Extremity Assessment: Defer to PT evaluation   Cervical / Trunk Assessment Cervical / Trunk Assessment: Normal   Communication Communication Communication: No difficulties   Cognition Arousal/Alertness: Lethargic Behavior During Therapy: Flat affect Overall Cognitive Status: Impaired/Different from baseline Area of Impairment: Orientation;Attention;Memory;Following commands;Safety/judgement;Awareness;Problem solving                 Orientation Level: Disoriented to;Situation Current Attention Level: Selective Memory: Decreased recall of precautions Following Commands: Follows one step commands with increased time Safety/Judgement: Decreased awareness of deficits;Decreased awareness of safety Awareness: Intellectual Problem Solving: Slow processing;Difficulty sequencing;Requires verbal cues     General Comments       Exercises     Shoulder Instructions      Home Living Family/patient expects to be discharged to:: Other (Comment)                                 Additional Comments: pt reported living in a hotel with her children      Prior Functioning/Environment Level of Independence: Independent                 OT Problem List: Decreased strength;Decreased range of motion;Decreased activity tolerance;Impaired balance (sitting and/or standing);Decreased safety awareness;Decreased knowledge of use of DME or  AE;Pain      OT Treatment/Interventions: Self-care/ADL training;Therapeutic exercise;DME and/or AE instruction;Therapeutic activities;Patient/family education;Balance training    OT Goals(Current goals can be found in the care plan section) Acute Rehab OT Goals Patient Stated Goal: to rest OT Goal Formulation: With patient Time For Goal Achievement: 07/11/21 Potential to Achieve Goals: Good ADL Goals Pt Will Perform Upper Body Bathing: Independently;sitting Pt Will Perform Lower Body Bathing: Independently;with modified independence;sit to/from stand Pt Will Transfer to Toilet: with modified independence;ambulating Pt Will Perform Tub/Shower Transfer: with modified independence;shower seat;ambulating;grab bars;rolling walker  OT Frequency: Min 2X/week   Barriers to D/C:            Co-evaluation PT/OT/SLP Co-Evaluation/Treatment: Yes Reason for Co-Treatment: Necessary to address cognition/behavior during functional activity PT goals addressed during session: Mobility/safety with mobility;Balance;Proper use of DME;Strengthening/ROM OT goals addressed during session: ADL's and self-care      AM-PAC OT "6 Clicks" Daily Activity     Outcome Measure Help from another person eating meals?: None Help from another person taking care of personal grooming?: A Little Help from another person toileting, which includes using toliet, bedpan, or urinal?: Total Help from another person bathing (including washing, rinsing, drying)?: A Lot Help from another person to put on and taking off regular upper body clothing?: A Little Help from another person to put on and taking off regular lower body clothing?: A Lot 6 Click Score: 15   End of Session Equipment Utilized During Treatment: Gait belt;Rolling walker Nurse Communication: Mobility status (lethargic)  Activity Tolerance: Patient limited by lethargy Patient left: in bed;with call bell/phone within reach;with bed alarm set  OT Visit  Diagnosis: Unsteadiness on feet (R26.81);Other  abnormalities of gait and mobility (R26.89);Repeated falls (R29.6)                Time: [id number] OT Time Calculation (min): 36 min Charges:  OT General Charges $OT Visit: 1 Visit OT Evaluation $OT Eval Low Complexity: [place] OTR/L  Acute Rehab Services  [phone] office number [phone] pager number   Joeseph Amor 06/26/2021, 10:57 AM

## 2021-06-26 NOTE — ED Notes (Signed)
Blood cultures obtained after antibiotics were given. MD made aware.

## 2021-06-26 NOTE — Progress Notes (Signed)
Initial Nutrition Assessment  DOCUMENTATION CODES:   Obesity unspecified  INTERVENTION:   -Once diet is advanced:  -Ensure Max po daily, each supplement provides 150 kcal and 30 grams of protein.   -MVI with minerals daily -1 packet Juven BID, each packet provides 95 calories, 2.5 grams of protein (collagen), and 9.8 grams of carbohydrate (3 grams sugar); also contains 7 grams of L-arginine and L-glutamine, 300 mg vitamin C, 15 mg vitamin E, 1.2 mcg vitamin B-12, 9.5 mg zinc, 200 mg calcium, and 1.5 g  Calcium Beta-hydroxy-Beta-methylbutyrate to support wound healing   NUTRITION DIAGNOSIS:   Increased nutrient needs related to wound healing as evidenced by estimated needs.  GOAL:   Patient will meet greater than or equal to 90% of their needs  MONITOR:   PO intake, Supplement acceptance, Diet advancement, Labs, Weight trends, Skin, I & O's  REASON FOR ASSESSMENT:   Consult Assessment of nutrition requirement/status, Wound healing  ASSESSMENT:   Sarah Long is a 50 y.o. female presenting with worsening fatigue, polydipsia and nausea in the setting of DKA . PMH is significant for T2DM, HTN, HLD, CHF, pancreatitis, hx of ETOh abuse, and Bipolar and depression.  Pt admitted with DKA and necrotizing fascitis of lt foot.   Reviewed I/O's: +4.6 L x 24 hours  UOP: 1.9 L x 24 hours  Per orthopedics notes, pt with bilateral foot ulcers (left worse than right). Pt may require lt BKA and rt great toe amputation however, awaiting ABI's. Plan for possible surgery today, so pt is NPO.  Pt very drowsy at time of visit; she was unable to keep her eyes open or carry a conversation with this RD. She complains of not feeling well due to nausea. She shares she was not eating well PTA, but unable to provide further details.   Reviewed wt hx; wt has been stable over the past 3 months.   Medications reviewed and include 0.9% sodium chloride infusion @ 125 ml/hr.   Lab Results  Component  Value Date   HGBA1C 13.6 (A) 06/18/2021   PTA DM medications are 40 units insulin glargine BID, 0-12 units insulin aspart TID, 10 units inuslin aspart with meals and .   Labs reviewed: Na: 133, K: 5.5, CBGS: 190-228 (inpatient orders for glycemic control are 0-9 units insulin aspart every 4 hours and 26 units insulin glargine daily).    NUTRITION - FOCUSED PHYSICAL EXAM:  Flowsheet Row Most Recent Value  Orbital Region No depletion  Upper Arm Region No depletion  Thoracic and Lumbar Region No depletion  Buccal Region No depletion  Temple Region No depletion  Clavicle Bone Region No depletion  Clavicle and Acromion Bone Region No depletion  Scapular Bone Region No depletion  Dorsal Hand No depletion  Patellar Region Mild depletion  Anterior Thigh Region Mild depletion  Posterior Calf Region Mild depletion  Edema (RD Assessment) None  Hair Reviewed  Eyes Reviewed  Mouth Reviewed  Skin Reviewed  Nails Reviewed       Diet Order:   Diet Order             Diet NPO time specified  Diet effective now                   EDUCATION NEEDS:   Not appropriate for education at this time  Skin:  Skin Assessment: Skin Integrity Issues: Skin Integrity Issues:: Other (Comment), Diabetic Ulcer Diabetic Ulcer: bilateral feet Other: MASD to buttocks  Last BM:  Unknown  Height:   Ht Readings from Last 1 Encounters:  06/18/21 '5\' 4"'$  (1.626 m)    Weight:   Wt Readings from Last 1 Encounters:  06/18/21 92.5 kg    Ideal Body Weight:  54.5 kg  BMI:  There is no height or weight on file to calculate BMI.  Estimated Nutritional Needs:   Kcal:  1900-2100  Protein:  110-125 grams  Fluid:  > 1.9 L    Loistine Chance, RD, LDN, [place] Registered Dietitian II Certified Diabetes Care and Education Specialist Please refer to [facility] for RD and/or RD on-call/weekend/after hours pager

## 2021-06-26 NOTE — ED Notes (Signed)
RN to CT with pt. RN attempted to call report to 2W. Secretary made this RN aware that the nurse taking report is on lunch. This RN asked to give report to charge. Made informed the nurse taking report is charge. States nurse went on lunch about 10 mins ago. Will attempt to call report at a later time.

## 2021-06-26 NOTE — Significant Event (Signed)
Called and spoke with ortho Silvestre Gunner, PA. He reports he is aware of the patient and will see her this morning.   Primary team still working on possible MRI of her foot, pending pacemaker determination.   Ezequiel Essex, MD PGY-2, [facility] Service pager [phone]

## 2021-06-27 ENCOUNTER — Encounter (HOSPITAL_COMMUNITY): Payer: Self-pay | Admitting: Family Medicine

## 2021-06-27 ENCOUNTER — Inpatient Hospital Stay (HOSPITAL_COMMUNITY): Payer: Medicaid Other | Admitting: Anesthesiology

## 2021-06-27 ENCOUNTER — Encounter (HOSPITAL_COMMUNITY)
Admission: EM | Disposition: A | Discharge status: Rehabilitation Facility | Payer: Self-pay | Source: Home / Self Care | Attending: Family Medicine

## 2021-06-27 ENCOUNTER — Inpatient Hospital Stay (HOSPITAL_COMMUNITY): Payer: Medicaid Other

## 2021-06-27 DIAGNOSIS — I96 Gangrene, not elsewhere classified: Secondary | ICD-10-CM | POA: Diagnosis not present

## 2021-06-27 DIAGNOSIS — E0810 Diabetes mellitus due to underlying condition with ketoacidosis without coma: Secondary | ICD-10-CM | POA: Diagnosis not present

## 2021-06-27 DIAGNOSIS — M726 Necrotizing fasciitis: Secondary | ICD-10-CM | POA: Diagnosis not present

## 2021-06-27 DIAGNOSIS — N179 Acute kidney failure, unspecified: Secondary | ICD-10-CM | POA: Diagnosis not present

## 2021-06-27 DIAGNOSIS — E43 Unspecified severe protein-calorie malnutrition: Secondary | ICD-10-CM | POA: Diagnosis not present

## 2021-06-27 HISTORY — PX: AMPUTATION: SHX166

## 2021-06-27 LAB — CBC WITH DIFFERENTIAL/PLATELET
Abs Immature Granulocytes: 1.55 10*3/uL — ABNORMAL HIGH (ref 0.00–0.07)
Basophils Absolute: 0.1 10*3/uL (ref 0.0–0.1)
Basophils Relative: 0 %
Eosinophils Absolute: 0.1 10*3/uL (ref 0.0–0.5)
Eosinophils Relative: 0 %
HCT: 23 % — ABNORMAL LOW (ref 36.0–46.0)
Hemoglobin: 7.7 g/dL — ABNORMAL LOW (ref 12.0–15.0)
Immature Granulocytes: 4 %
Lymphocytes Relative: 7 %
Lymphs Abs: 3 10*3/uL (ref 0.7–4.0)
MCH: 28.4 pg (ref 26.0–34.0)
MCHC: 33.5 g/dL (ref 30.0–36.0)
MCV: 84.9 fL (ref 80.0–100.0)
Monocytes Absolute: 1 10*3/uL (ref 0.1–1.0)
Monocytes Relative: 2 %
Neutro Abs: 34.3 10*3/uL — ABNORMAL HIGH (ref 1.7–7.7)
Neutrophils Relative %: 87 %
Platelets: 355 10*3/uL (ref 150–400)
RBC: 2.71 MIL/uL — ABNORMAL LOW (ref 3.87–5.11)
RDW: 15 % (ref 11.5–15.5)
WBC: 40.1 10*3/uL — ABNORMAL HIGH (ref 4.0–10.5)
nRBC: 0.1 % (ref 0.0–0.2)

## 2021-06-27 LAB — GLUCOSE, CAPILLARY
Glucose-Capillary: 265 mg/dL — ABNORMAL HIGH (ref 70–99)
Glucose-Capillary: 232 mg/dL — ABNORMAL HIGH (ref 70–99)
Glucose-Capillary: 236 mg/dL — ABNORMAL HIGH (ref 70–99)
Glucose-Capillary: 223 mg/dL — ABNORMAL HIGH (ref 70–99)
Glucose-Capillary: 289 mg/dL — ABNORMAL HIGH (ref 70–99)
Glucose-Capillary: 394 mg/dL — ABNORMAL HIGH (ref 70–99)
Glucose-Capillary: 404 mg/dL — ABNORMAL HIGH (ref 70–99)

## 2021-06-27 LAB — MAGNESIUM
Magnesium: 1.7 mg/dL (ref 1.7–2.4)
Magnesium: 1.8 mg/dL (ref 1.7–2.4)

## 2021-06-27 LAB — TROPONIN I (HIGH SENSITIVITY): Troponin I (High Sensitivity): 48 ng/L — ABNORMAL HIGH (ref <18)

## 2021-06-27 LAB — COMPREHENSIVE METABOLIC PANEL
ALT: 15 U/L (ref 0–44)
AST: 19 U/L (ref 15–41)
Albumin: 1.3 g/dL — ABNORMAL LOW (ref 3.5–5.0)
Alkaline Phosphatase: 106 U/L (ref 38–126)
Anion gap: 10 (ref 5–15)
BUN: 64 mg/dL — ABNORMAL HIGH (ref 6–20)
CO2: 19 mmol/L — ABNORMAL LOW (ref 22–32)
Calcium: 7.7 mg/dL — ABNORMAL LOW (ref 8.9–10.3)
Chloride: 104 mmol/L (ref 98–111)
Creatinine, Ser: 1.97 mg/dL — ABNORMAL HIGH (ref 0.44–1.00)
GFR, Estimated: 31 mL/min — ABNORMAL LOW (ref >60)
Glucose, Bld: 264 mg/dL — ABNORMAL HIGH (ref 70–99)
Potassium: 3.1 mmol/L — ABNORMAL LOW (ref 3.5–5.1)
Sodium: 133 mmol/L — ABNORMAL LOW (ref 135–145)
Total Bilirubin: 0.4 mg/dL (ref 0.3–1.2)
Total Protein: 6.6 g/dL (ref 6.5–8.1)

## 2021-06-27 LAB — VANCOMYCIN, RANDOM: Vancomycin Rm: 12

## 2021-06-27 LAB — HIV ANTIBODY (ROUTINE TESTING W REFLEX): HIV Screen 4th Generation wRfx: NONREACTIVE

## 2021-06-27 LAB — ABO/RH: ABO/RH(D): B POS

## 2021-06-27 LAB — PHOSPHORUS
Phosphorus: 1.8 mg/dL — ABNORMAL LOW (ref 2.5–4.6)
Phosphorus: 4.5 mg/dL (ref 2.5–4.6)

## 2021-06-27 LAB — PREPARE RBC (CROSSMATCH)

## 2021-06-27 SURGERY — AMPUTATION BELOW KNEE
Anesthesia: General | Site: Knee | Laterality: Left

## 2021-06-27 MED ORDER — MAGNESIUM CITRATE PO SOLN: 1.0000 | Freq: Once | ORAL | Status: DC | PRN

## 2021-06-27 MED ORDER — HYDRALAZINE HCL 20 MG/ML IJ SOLN: 5.0000 mg | INTRAMUSCULAR | Status: DC | PRN

## 2021-06-27 MED ORDER — MIDAZOLAM HCL 2 MG/2ML IJ SOLN
INTRAMUSCULAR | Status: AC
  Filled 2021-06-27: qty 2

## 2021-06-27 MED ORDER — BUPIVACAINE HCL (PF) 0.5 % IJ SOLN
INTRAMUSCULAR | Status: DC | PRN
  Administered 2021-06-27: 30 mL via PERINEURAL

## 2021-06-27 MED ORDER — SODIUM CHLORIDE 0.9% FLUSH
10.0000 mL | Freq: Two times a day (BID) | INTRAVENOUS | Status: DC
  Administered 2021-06-27: 10 mL

## 2021-06-27 MED ORDER — GUAIFENESIN-DM 100-10 MG/5ML PO SYRP: 15.0000 mL | ORAL_SOLUTION | ORAL | Status: DC | PRN

## 2021-06-27 MED ORDER — ACETAMINOPHEN 325 MG PO TABS: 325.0000 mg | ORAL_TABLET | Freq: Four times a day (QID) | ORAL | Status: DC | PRN

## 2021-06-27 MED ORDER — POTASSIUM PHOSPHATES 15 MMOLE/5ML IV SOLN
20.0000 mmol | Freq: Once | INTRAVENOUS | Status: AC
  Administered 2021-06-27: 20 mmol via INTRAVENOUS
  Filled 2021-06-27: qty 6.67

## 2021-06-27 MED ORDER — ORAL CARE MOUTH RINSE: 15.0000 mL | Freq: Once | OROMUCOSAL | Status: AC

## 2021-06-27 MED ORDER — MIDAZOLAM HCL 2 MG/2ML IJ SOLN
INTRAMUSCULAR | Status: DC | PRN
  Administered 2021-06-27: 2 mg via INTRAVENOUS

## 2021-06-27 MED ORDER — OXYCODONE HCL 5 MG PO TABS: 5.0000 mg | ORAL_TABLET | ORAL | Status: DC | PRN

## 2021-06-27 MED ORDER — CHLORHEXIDINE GLUCONATE 0.12 % MT SOLN: 15.0000 mL | Freq: Once | OROMUCOSAL | Status: AC

## 2021-06-27 MED ORDER — OXYCODONE HCL 5 MG PO TABS
10.0000 mg | ORAL_TABLET | ORAL | Status: DC | PRN
  Administered 2021-06-30: 10 mg via ORAL
  Filled 2021-06-27: qty 2

## 2021-06-27 MED ORDER — METOPROLOL TARTRATE 5 MG/5ML IV SOLN: 2.0000 mg | INTRAVENOUS | Status: DC | PRN

## 2021-06-27 MED ORDER — ASCORBIC ACID 500 MG PO TABS
1000.0000 mg | ORAL_TABLET | Freq: Every day | ORAL | Status: DC
  Administered 2021-06-27 – 2021-07-01 (×5): 1000 mg via ORAL
  Filled 2021-06-27 (×5): qty 2

## 2021-06-27 MED ORDER — POLYETHYLENE GLYCOL 3350 17 G PO PACK
17.0000 g | PACK | Freq: Every day | ORAL | Status: DC | PRN
  Administered 2021-07-01: 17 g via ORAL
  Filled 2021-06-27: qty 1

## 2021-06-27 MED ORDER — INSULIN ASPART 100 UNIT/ML IJ SOLN
INTRAMUSCULAR | Status: AC
  Filled 2021-06-27: qty 1

## 2021-06-27 MED ORDER — CHLORHEXIDINE GLUCONATE 0.12 % MT SOLN
OROMUCOSAL | Status: AC
  Administered 2021-06-27: 15 mL via OROMUCOSAL
  Filled 2021-06-27: qty 15

## 2021-06-27 MED ORDER — LIDOCAINE 2% (20 MG/ML) 5 ML SYRINGE
INTRAMUSCULAR | Status: DC | PRN
  Administered 2021-06-27: 20 mg via INTRAVENOUS

## 2021-06-27 MED ORDER — MAGNESIUM SULFATE 2 GM/50ML IV SOLN
2.0000 g | Freq: Once | INTRAVENOUS | Status: AC
  Administered 2021-06-27: 2 g via INTRAVENOUS
  Filled 2021-06-27: qty 50

## 2021-06-27 MED ORDER — INSULIN ASPART 100 UNIT/ML IJ SOLN: 0.0000 [IU] | Freq: Every day | INTRAMUSCULAR | Status: DC

## 2021-06-27 MED ORDER — ROPIVACAINE HCL 5 MG/ML IJ SOLN
INTRAMUSCULAR | Status: DC | PRN
  Administered 2021-06-27: 20 mL via PERINEURAL

## 2021-06-27 MED ORDER — PROPOFOL 10 MG/ML IV BOLUS
INTRAVENOUS | Status: DC | PRN
  Administered 2021-06-27 (×2): 50 mg via INTRAVENOUS
  Administered 2021-06-27: 100 mg via INTRAVENOUS

## 2021-06-27 MED ORDER — MAGNESIUM SULFATE 2 GM/50ML IV SOLN: 2.0000 g | Freq: Every day | INTRAVENOUS | Status: DC | PRN

## 2021-06-27 MED ORDER — ONDANSETRON HCL 4 MG/2ML IJ SOLN: 4.0000 mg | Freq: Four times a day (QID) | INTRAMUSCULAR | Status: DC | PRN

## 2021-06-27 MED ORDER — LABETALOL HCL 5 MG/ML IV SOLN: 10.0000 mg | INTRAVENOUS | Status: DC | PRN

## 2021-06-27 MED ORDER — INSULIN ASPART 100 UNIT/ML IJ SOLN
0.0000 [IU] | Freq: Three times a day (TID) | INTRAMUSCULAR | Status: DC
  Administered 2021-06-27: 15 [IU] via SUBCUTANEOUS

## 2021-06-27 MED ORDER — ALUM & MAG HYDROXIDE-SIMETH 200-200-20 MG/5ML PO SUSP
15.0000 mL | ORAL | Status: DC | PRN
  Administered 2021-06-28 – 2021-06-30 (×3): 30 mL via ORAL
  Filled 2021-06-27 (×3): qty 30

## 2021-06-27 MED ORDER — JUVEN PO PACK: 1.0000 | PACK | Freq: Two times a day (BID) | ORAL | Status: DC

## 2021-06-27 MED ORDER — POTASSIUM CHLORIDE CRYS ER 20 MEQ PO TBCR
40.0000 meq | EXTENDED_RELEASE_TABLET | Freq: Once | ORAL | Status: AC
  Administered 2021-06-27: 40 meq via ORAL
  Filled 2021-06-27: qty 2

## 2021-06-27 MED ORDER — SODIUM CHLORIDE 0.9 % IV SOLN: INTRAVENOUS | Status: DC

## 2021-06-27 MED ORDER — CHLORHEXIDINE GLUCONATE CLOTH 2 % EX PADS
6.0000 | MEDICATED_PAD | Freq: Every day | CUTANEOUS | Status: DC
  Administered 2021-06-27 – 2021-07-01 (×5): 6 via TOPICAL

## 2021-06-27 MED ORDER — POTASSIUM CHLORIDE CRYS ER 20 MEQ PO TBCR: 20.0000 meq | EXTENDED_RELEASE_TABLET | Freq: Every day | ORAL | Status: DC | PRN

## 2021-06-27 MED ORDER — DEXAMETHASONE SODIUM PHOSPHATE 10 MG/ML IJ SOLN
INTRAMUSCULAR | Status: DC | PRN
  Administered 2021-06-27: 5 mg via INTRAVENOUS

## 2021-06-27 MED ORDER — SODIUM CHLORIDE 0.9% FLUSH: 10.0000 mL | INTRAVENOUS | Status: DC | PRN

## 2021-06-27 MED ORDER — VANCOMYCIN HCL IN DEXTROSE 1-5 GM/200ML-% IV SOLN
1000.0000 mg | Freq: Once | INTRAVENOUS | Status: AC
  Administered 2021-06-27: 1000 mg via INTRAVENOUS
  Filled 2021-06-27: qty 200

## 2021-06-27 MED ORDER — PHENOL 1.4 % MT LIQD: 1.0000 | OROMUCOSAL | Status: DC | PRN

## 2021-06-27 MED ORDER — FENTANYL CITRATE (PF) 250 MCG/5ML IJ SOLN
INTRAMUSCULAR | Status: AC
  Filled 2021-06-27: qty 5

## 2021-06-27 MED ORDER — ZINC SULFATE 220 (50 ZN) MG PO CAPS
220.0000 mg | ORAL_CAPSULE | Freq: Every day | ORAL | Status: DC
  Administered 2021-06-27 – 2021-07-01 (×5): 220 mg via ORAL
  Filled 2021-06-27 (×5): qty 1

## 2021-06-27 MED ORDER — DOXYCYCLINE HYCLATE 100 MG PO TABS
100.0000 mg | ORAL_TABLET | Freq: Two times a day (BID) | ORAL | Status: DC
  Administered 2021-06-28 – 2021-07-01 (×7): 100 mg via ORAL
  Filled 2021-06-27 (×7): qty 1

## 2021-06-27 MED ORDER — BISACODYL 5 MG PO TBEC: 5.0000 mg | DELAYED_RELEASE_TABLET | Freq: Every day | ORAL | Status: DC | PRN

## 2021-06-27 MED ORDER — PHENYLEPHRINE HCL-NACL 10-0.9 MG/250ML-% IV SOLN
INTRAVENOUS | Status: DC | PRN
  Administered 2021-06-27: 20 ug/min via INTRAVENOUS

## 2021-06-27 MED ORDER — INSULIN ASPART 100 UNIT/ML IJ SOLN
5.0000 [IU] | Freq: Once | INTRAMUSCULAR | Status: AC
  Administered 2021-06-27: 5 [IU] via SUBCUTANEOUS

## 2021-06-27 MED ORDER — HYDROMORPHONE HCL 1 MG/ML IJ SOLN
0.5000 mg | INTRAMUSCULAR | Status: DC | PRN
  Administered 2021-06-29 (×2): 1 mg via INTRAVENOUS
  Filled 2021-06-27 (×2): qty 1

## 2021-06-27 MED ORDER — PROPOFOL 10 MG/ML IV BOLUS
INTRAVENOUS | Status: AC
  Filled 2021-06-27: qty 20

## 2021-06-27 MED ORDER — 0.9 % SODIUM CHLORIDE (POUR BTL) OPTIME
TOPICAL | Status: DC | PRN
  Administered 2021-06-27: 1000 mL

## 2021-06-27 MED ORDER — FENTANYL CITRATE (PF) 250 MCG/5ML IJ SOLN
INTRAMUSCULAR | Status: DC | PRN
  Administered 2021-06-27: 50 ug via INTRAVENOUS

## 2021-06-27 MED ORDER — ACETAMINOPHEN 500 MG PO TABS
500.0000 mg | ORAL_TABLET | Freq: Three times a day (TID) | ORAL | Status: DC
  Administered 2021-06-28 – 2021-07-01 (×14): 500 mg via ORAL
  Filled 2021-06-27 (×15): qty 1

## 2021-06-27 MED ORDER — PANTOPRAZOLE SODIUM 40 MG PO TBEC
40.0000 mg | DELAYED_RELEASE_TABLET | Freq: Every day | ORAL | Status: DC
  Administered 2021-06-27 – 2021-07-01 (×5): 40 mg via ORAL
  Filled 2021-06-27 (×6): qty 1

## 2021-06-27 MED ORDER — AMISULPRIDE (ANTIEMETIC) 5 MG/2ML IV SOLN: 10.0000 mg | Freq: Once | INTRAVENOUS | Status: DC | PRN

## 2021-06-27 MED ORDER — INSULIN GLARGINE 100 UNIT/ML ~~LOC~~ SOLN
30.0000 [IU] | SUBCUTANEOUS | Status: DC
  Administered 2021-06-28 – 2021-06-29 (×2): 30 [IU] via SUBCUTANEOUS
  Filled 2021-06-27 (×3): qty 0.3

## 2021-06-27 MED ORDER — CEFAZOLIN SODIUM-DEXTROSE 2-4 GM/100ML-% IV SOLN
2.0000 g | Freq: Three times a day (TID) | INTRAVENOUS | Status: AC
  Administered 2021-06-27 (×2): 2 g via INTRAVENOUS
  Filled 2021-06-27 (×2): qty 100

## 2021-06-27 MED ORDER — PHENYLEPHRINE 40 MCG/ML (10ML) SYRINGE FOR IV PUSH (FOR BLOOD PRESSURE SUPPORT)
PREFILLED_SYRINGE | INTRAVENOUS | Status: DC | PRN
  Administered 2021-06-27: 80 ug via INTRAVENOUS
  Administered 2021-06-27 (×2): 120 ug via INTRAVENOUS

## 2021-06-27 MED ORDER — ONDANSETRON HCL 4 MG/2ML IJ SOLN
INTRAMUSCULAR | Status: DC | PRN
  Administered 2021-06-27: 4 mg via INTRAVENOUS

## 2021-06-27 MED ORDER — DOCUSATE SODIUM 100 MG PO CAPS
100.0000 mg | ORAL_CAPSULE | Freq: Every day | ORAL | Status: DC
  Administered 2021-06-29 – 2021-07-01 (×3): 100 mg via ORAL
  Filled 2021-06-27 (×4): qty 1

## 2021-06-27 SURGICAL SUPPLY — 39 items
BAG COUNTER SPONGE SURGICOUNT (BAG) IMPLANT
BAG SPNG CNTER NS LX DISP (BAG)
BLADE SAW RECIP 87.9 MT (BLADE) ×2 IMPLANT
BLADE SURG 21 STRL SS (BLADE) ×2 IMPLANT
BNDG COHESIVE 6X5 TAN STRL LF (GAUZE/BANDAGES/DRESSINGS) IMPLANT
CANISTER WOUND CARE 500ML ATS (WOUND CARE) ×2 IMPLANT
COVER SURGICAL LIGHT HANDLE (MISCELLANEOUS) ×2 IMPLANT
CUFF TOURN SGL QUICK 34 (TOURNIQUET CUFF) ×2
CUFF TRNQT CYL 34X4.125X (TOURNIQUET CUFF) ×1 IMPLANT
DRAPE INCISE IOBAN 66X45 STRL (DRAPES) ×2 IMPLANT
DRAPE U-SHAPE 47X51 STRL (DRAPES) ×2 IMPLANT
DRESSING PEEL AND PLAC PRVNA20 (GAUZE/BANDAGES/DRESSINGS) IMPLANT
DRESSING PREVENA PLUS CUSTOM (GAUZE/BANDAGES/DRESSINGS) ×1 IMPLANT
DRSG PEEL AND PLACE PREVENA 20 (GAUZE/BANDAGES/DRESSINGS) ×2
DRSG PREVENA PLUS CUSTOM (GAUZE/BANDAGES/DRESSINGS) ×2
DURAPREP 26ML APPLICATOR (WOUND CARE) ×2 IMPLANT
ELECT REM PT RETURN 9FT ADLT (ELECTROSURGICAL) ×2
ELECTRODE REM PT RTRN 9FT ADLT (ELECTROSURGICAL) ×1 IMPLANT
GLOVE SURG ORTHO LTX SZ9 (GLOVE) ×2 IMPLANT
GLOVE SURG UNDER POLY LF SZ9 (GLOVE) ×2 IMPLANT
GOWN STRL REUS W/ TWL XL LVL3 (GOWN DISPOSABLE) ×2 IMPLANT
GOWN STRL REUS W/TWL XL LVL3 (GOWN DISPOSABLE) ×4
KIT BASIN OR (CUSTOM PROCEDURE TRAY) ×2 IMPLANT
KIT TURNOVER KIT B (KITS) ×2 IMPLANT
MANIFOLD NEPTUNE II (INSTRUMENTS) ×2 IMPLANT
NS IRRIG 1000ML POUR BTL (IV SOLUTION) ×2 IMPLANT
PACK ORTHO EXTREMITY (CUSTOM PROCEDURE TRAY) ×2 IMPLANT
PAD ARMBOARD 7.5X6 YLW CONV (MISCELLANEOUS) ×2 IMPLANT
PREVENA RESTOR ARTHOFORM 46X30 (CANNISTER) ×2 IMPLANT
SPONGE T-LAP 18X18 ~~LOC~~+RFID (SPONGE) IMPLANT
STAPLER VISISTAT 35W (STAPLE) IMPLANT
STOCKINETTE IMPERVIOUS LG (DRAPES) ×2 IMPLANT
SUT ETHILON 2 0 PSLX (SUTURE) IMPLANT
SUT SILK 2 0 (SUTURE) ×2
SUT SILK 2-0 18XBRD TIE 12 (SUTURE) ×1 IMPLANT
SUT VIC AB 1 CTX 27 (SUTURE) ×4 IMPLANT
TOWEL GREEN STERILE (TOWEL DISPOSABLE) ×2 IMPLANT
TUBE CONNECTING 12X1/4 (SUCTIONS) ×2 IMPLANT
YANKAUER SUCT BULB TIP NO VENT (SUCTIONS) ×2 IMPLANT

## 2021-06-27 NOTE — Transfer of Care (Signed)
Immediate Anesthesia Transfer of Care Note  [name]  Procedure(s) Performed: AMPUTATION BELOW KNEE (Left: Knee)  Patient Location: PACU  Anesthesia Type:General  Level of Consciousness: awake, alert  and oriented  Airway & Oxygen Therapy: Patient Spontanous Breathing  Post-op Assessment: Report given to RN and Post -op Vital signs reviewed and stable  Post vital signs: Reviewed and stable  Last Vitals:  Vitals Value Taken Time  BP 123/73 06/27/21 0843  Temp    Pulse 76 06/27/21 0845  Resp 31 06/27/21 0845  SpO2 98 % 06/27/21 0845  Vitals shown include unvalidated device data.  Last Pain:  Vitals:   06/27/21 0711  TempSrc: Oral  PainSc:          Complications: No notable events documented.

## 2021-06-27 NOTE — Procedures (Signed)
Central Venous Catheter Insertion Procedure Note  Sarah Long  [id number]  1971/11/04  Date:06/27/21  Time:2:52 AM   Provider Performing:Seong-Joo Carson Myrtle   Procedure: Insertion of Non-tunneled Central Venous [id number]) with US guidance [id number])   Indication(s) Medication administration and Difficult access  Consent Risks of the procedure as well as the alternatives and risks of each were explained to the patient and/or caregiver.  Consent for the procedure was obtained and is signed in the bedside chart  Anesthesia Topical only with 1% lidocaine   Timeout Verified patient identification, verified procedure, site/side was marked, verified correct patient position, special equipment/implants available, medications/allergies/relevant history reviewed, required imaging and test results available.  Sterile Technique Maximal sterile technique including full sterile barrier drape, hand hygiene, sterile gown, sterile gloves, mask, hair covering, sterile ultrasound probe cover (if used).  Procedure Description Area of catheter insertion was cleaned with chlorhexidine and draped in sterile fashion.  With real-time ultrasound guidance a central venous catheter was placed into the right internal jugular vein. Nonpulsatile blood flow and easy flushing noted in all ports.  The catheter was sutured in place and sterile dressing applied.  Complications/Tolerance None; patient tolerated the procedure well. Chest X-ray is ordered to verify placement for internal jugular or subclavian cannulation.    EBL Minimal  Specimen(s) None  Renee Pain, MD Board Certified by the ABIM, [place]

## 2021-06-27 NOTE — Progress Notes (Addendum)
Family Medicine Teaching Service Daily Progress Note Intern Pager: [phone]  Patient name: Sarah Long Medical record number: [id number] Date of birth: Nov 02, 1971 Age: 50 y.o. Gender: female  Primary Care Provider: [name], MD Consultants: Vascular, ortho, ID Code Status: Full  Pt Overview and Major Events to Date:  7/21 admitted 7/23 left transtibial amputation  Assessment and Plan: Sarah Long is a 50 year old female who presented in DKA with necrotizing fasciitis of left foot and osteomyelitis of right foot.  POD #0 s/p left transtibial amputation.  PMH includes type II DM, HTN, HLD, CHF, chronic pancreatitis, bipolar disorder, history of EtOH abuse.  Necrotizing fasciitis of left foot s/p transtibial amputation Patient currently postop day #0 after left transtibial amputation.  Doing well, local block still in effect.  Left leg bandaging and orthopedic appliance in place. - Schedule Tylenol every 6 - Per orthopedist, Oxy IR 5-10 mg every 4 as needed moderate pain - Per orthopedist, Oxy IR 10-15 every 4 as needed for severe pain - Orthopedist, Dilaudid IV as needed for pain not responding to p.o. medications - PT/OT eval and treat - Bedrest with pure wick - Fall precautions - Continue vancomycin, clindamycin, cefepime through Sunday 7/24 at 10 AM - S/p cefazolin x2 for surgical prophylaxis - Doxycycline to start Sunday 7/24 at 10 AM as discussed below   Osteomyelitis of right foot Per orthopedist, does not want to pursue surgical intervention at this time.  Recommends doxycycline x4 weeks with outpatient orthopedic follow-up at that time. -Doxy to start 7/24 at 10 AM   Type 2 DM Glucose in 200s last 24 hours. - Continue every 4 CBGs - SSI moderate 3 times daily with meals, increase to moderate from sensitive - Increase Lantus from 26 to 30 units subcu q. 24  Hypertension Blood pressure intermittently hypertensive today.  BP 120-169/62-81.  Last 137/73.  No home  medications for hypertension.  Recommend PCP to follow-up.   FEN/GI: Carb modified PPx: SCDs until able to start Lovenox postop Dispo:Pending PT recommendations  pending clinical improvement . Barriers include PT/OT eval and treat, recent transtibial amputation.   Subjective:  Patient resting bed comfortably, no acute distress.  She appears groggy after surgery.  She has no complaints, reports her pain is well controlled.  Only request is for more soda to drink.  Objective: Temp:  [97.6 F (36.4 C)-98.8 F (37.1 C)] 97.6 F (36.4 C) (07/23 1617) Pulse Rate:  [73-95] 76 (07/23 1617) Resp:  [11-20] 18 (07/23 1617) BP: (120-169)/(62-81) 158/77 (07/23 1617) SpO2:  [96 %-100 %] 96 % (07/23 1617) Weight:  [90.7 kg] 90.7 kg (07/23 0711) Physical Exam: General: Patient awake but groggy appearing, oriented, pleasant Cardiovascular: Regular rate, paced on cardiac monitoring Respiratory: Clear to auscultation bilaterally Abdomen: Bowel sounds present Extremities: LLE s/p transtibial amputation with bandaging and orthopedic appliance in place, RLE without edema, no TTP, unchanged right great toe ulcer (photos below)     Laboratory: Recent Labs  Lab 06/26/21 0144 06/26/21 0625 06/27/21 0123  WBC 35.0* 42.7* 40.1*  HGB 8.7* 8.8* 7.7*  HCT 27.0* 27.4* 23.0*  PLT 406* 404* 355   Recent Labs  Lab 06/25/21 1655 06/25/21 1735 06/26/21 0144 06/26/21 0625 06/26/21 1103 06/27/21 0123  NA 123*   < > 129* 133* 133* 133*  K 4.6   < > 6.1* 3.4* 5.5* 3.1*  CL 89*   < > 98 103 102 104  CO2 12*   < > 17* 17* 20* 19*  BUN  86*   < > 83* 77* 76* 64*  CREATININE 3.80*   < > 3.12* 2.70* 2.54* 1.97*  CALCIUM 7.9*   < > 7.6* 7.7* 7.7* 7.7*  PROT 7.6  --  6.9  --   --  6.6  BILITOT 2.1*  --  2.5*  --   --  0.4  ALKPHOS 101  --  81  --   --  106  ALT 20  --  18  --   --  15  AST 23  --  52*  --   --  19  GLUCOSE 848*   < > 464* 224* 238* 264*   < > = values in this interval not displayed.     Imaging/Diagnostic Tests: PORTABLE CHEST 1 VIEW 06/27/21  COMPARISON:  06/25/2021 FINDINGS: Right IJ central venous catheter tip is at the cavoatrial junction. No focal airspace consolidation or pulmonary edema. No pleural effusion or pneumothorax. IMPRESSION: Right IJ central venous catheter tip at the cavoatrial junction.  MRI OF THE RIGHT FOREFOOT WITHOUT CONTRAST 06/26/21 IMPRESSION: Edema signal with mild low T1 signal within the great toe distal phalanx and partially within the proximal phalanx, with possible adjacent soft tissue ulcer. Signal characteristics are compatible with either early osteomyelitis or reactive marrow signal change. Bony edema throughout the middle cuneiform without distinct fracture or confluent low T1 signal to suggest infection. This is favored to represent reactive marrow signal change.   Ezequiel Essex, MD 06/27/2021, 6:23 PM PGY-2, [place] Intern pager: [phone], text pages welcome

## 2021-06-27 NOTE — Progress Notes (Addendum)
Unable to reach PICC line despite multiple attempts. RN has tried to pull labs from patient's IV lines however unable to do so. Pt needs troponins and other labs drawn. I have spoken to [place] who will kindly see the patient to place central line. Unable to reach either daughter with numbers provided on epic.   Lattie Haw MD PGY-3, [place]

## 2021-06-27 NOTE — Anesthesia Procedure Notes (Signed)
Anesthesia Regional Block: Popliteal block   Pre-Anesthetic Checklist: , timeout performed,  Correct Patient, Correct Site, Correct Laterality,  Correct Procedure, Correct Position, site marked,  Risks and benefits discussed,  Surgical consent,  Pre-op evaluation,  At surgeon's request and post-op pain management  Laterality: Left  Prep: chloraprep       Needles:  Injection technique: Single-shot  Needle Type: Echogenic Needle     Needle Length: 9cm  Needle Gauge: 21     Additional Needles:   Procedures:,,,, ultrasound used (permanent image in chart),,    Narrative:  Start time: 06/27/2021 7:16 AM End time: 06/27/2021 7:23 AM Injection made incrementally with aspirations every 5 mL.  Performed by: Personally  Anesthesiologist: Suzette Battiest, MD

## 2021-06-27 NOTE — Progress Notes (Signed)
IV team consult for lab draw without central line. Informed nurse that per policy, IV team does not insert PIVs including midlines to obtain blood samples. The patient has 3 PIVs. Recommended a central line.

## 2021-06-27 NOTE — Anesthesia Postprocedure Evaluation (Signed)
Anesthesia Post Note  [name]  Procedure(s) Performed: AMPUTATION BELOW KNEE (Left: Knee)     Patient location during evaluation: PACU Anesthesia Type: General Level of consciousness: awake and alert Pain management: pain level controlled Vital Signs Assessment: post-procedure vital signs reviewed and stable Respiratory status: spontaneous breathing, nonlabored ventilation, respiratory function stable and patient connected to nasal cannula oxygen Cardiovascular status: blood pressure returned to baseline and stable Postop Assessment: no apparent nausea or vomiting Anesthetic complications: no   No notable events documented.  Last Vitals:  Vitals:   06/27/21 1143 06/27/21 1200  BP: (!) 148/77 137/73  Pulse: 77 76  Resp: 15 14  Temp: 37.1 C   SpO2: 97% 99%    Last Pain:  Vitals:   06/27/21 1200  TempSrc:   PainSc: 0-No pain                 Tiajuana Amass

## 2021-06-27 NOTE — Anesthesia Procedure Notes (Signed)
Anesthesia Regional Block: Adductor canal block   Pre-Anesthetic Checklist: , timeout performed,  Correct Patient, Correct Site, Correct Laterality,  Correct Procedure, Correct Position, site marked,  Risks and benefits discussed,  Surgical consent,  Pre-op evaluation,  At surgeon's request and post-op pain management  Laterality: Left  Prep: chloraprep       Needles:  Injection technique: Single-shot  Needle Type: Echogenic Needle     Needle Length: 9cm  Needle Gauge: 21     Additional Needles:   Procedures:,,,, ultrasound used (permanent image in chart),,    Narrative:  Start time: 06/27/2021 7:24 AM End time: 06/27/2021 7:29 AM Injection made incrementally with aspirations every 5 mL.  Performed by: Personally  Anesthesiologist: Suzette Battiest, MD

## 2021-06-27 NOTE — Progress Notes (Signed)
Orthopedic Tech Progress Note Patient Details:  Sarah Long May 24, 1971 [id number] Called order into [facility] Patient ID: Sarah Long, female   DOB: 1970-12-24, 50 y.o.   MRN: [id number]  Chip Boer 06/27/2021, 1:41 PM

## 2021-06-27 NOTE — Progress Notes (Signed)
Pharmacy Antibiotic Note  Sarah Long is a 50 y.o. female admitted on 06/25/2021 with  left foot necrotizing fasciitis .  Pharmacy has been consulted for vancomycin and cefepime dosing in setting of AKI.  Random vanc level 12, SCr improving but not yet to baseline.  Plan: Vancomycin '1000mg'$  IV x1 now, continue to defer scheduled doses. Continue cefepime 2g IV Q12H.   Temp (24hrs), Avg:98.1 F (36.7 C), Min:97.7 F (36.5 C), Max:98.5 F (36.9 C)  Recent Labs  Lab 06/25/21 1655 06/25/21 1700 06/25/21 1735 06/25/21 2057 06/25/21 2121 06/25/21 2357 06/26/21 0030 06/26/21 0144 06/26/21 0625 06/26/21 1103 06/27/21 0123 06/27/21 0129  WBC 36.6*  --   --   --   --   --   --  35.0* 42.7*  --  40.1*  --   CREATININE 3.80*  --    < >  --    < >  --  3.18* 3.12* 2.70* 2.54* 1.97*  --   LATICACIDVEN  --  3.4*  --  2.5*  --  3.8*  --   --   --   --   --   --   VANCORANDOM  --   --   --   --   --   --   --   --   --   --   --  12   < > = values in this interval not displayed.    Estimated Creatinine Clearance: 38.9 mL/min (A) (by C-G formula based on SCr of 1.97 mg/dL (H)).    Allergies  Allergen Reactions   Strawberry Extract Hives   Sulfa Antibiotics Hives and Itching     Thank you for allowing pharmacy to be a part of this patient's care.  Wynona Neat, PharmD, BCPS  06/27/2021 2:58 AM

## 2021-06-27 NOTE — Progress Notes (Signed)
Patient unable to void after multiple attempts. Bladder scan of patient showed >696 ml. Straight catheterized patient and obtained 1967m. S/w MArdelia Mems MD face to face and alerted.

## 2021-06-27 NOTE — Anesthesia Procedure Notes (Signed)
Procedure Name: LMA Insertion Date/Time: 06/27/2021 7:53 AM Performed by: Dorthea Cove, CRNA Pre-anesthesia Checklist: Patient identified, Emergency Drugs available, Suction available and Patient being monitored Patient Re-evaluated:Patient Re-evaluated prior to induction Oxygen Delivery Method: Circle System Utilized Preoxygenation: Pre-oxygenation with 100% oxygen Induction Type: IV induction Ventilation: Mask ventilation without difficulty LMA: LMA inserted LMA Size: 4.0 Number of attempts: 1 Airway Equipment and Method: Bite block Placement Confirmation: positive ETCO2 Tube secured with: Tape Dental Injury: Teeth and Oropharynx as per pre-operative assessment

## 2021-06-27 NOTE — Progress Notes (Signed)
Inpatient Diabetes Program Recommendations  AACE/ADA: New Consensus Statement on Inpatient Glycemic Control  Target Ranges:  Prepandial:   less than 140 mg/dL      Peak postprandial:   less than 180 mg/dL (1-2 hours)      Critically ill patients:  140 - 180 mg/dL  Results for Sarah Long, Sarah Long (MRN [id number]) as of 06/27/2021 12:19  Ref. Range 06/27/2021 03:33 06/27/2021 07:11 06/27/2021 08:45 06/27/2021 11:41  Glucose-Capillary Latest Ref Range: 70 - 99 mg/dL 232 (H) 236 (H) 223 (H) 289 (H)   Results for Sarah Long, Sarah Long (MRN [id number]) as of 06/27/2021 12:19  Ref. Range 06/26/2021 07:25 06/26/2021 08:38 06/26/2021 11:59 06/26/2021 15:50 06/26/2021 20:53 06/27/2021 01:13  Glucose-Capillary Latest Ref Range: 70 - 99 mg/dL 180 (H) 190 (H) 228 (H) 251 (H) 262 (H) 265 (H)   Review of Glycemic Control  Diabetes history: DM2 Outpatient Diabetes medications: Lantus 40 units QHS, Novolog 10-20 units TID with meals Current orders for Inpatient glycemic control: Lantus 26 units Q24H, Novolog 0-9 units Q4H  Inpatient Diabetes Program Recommendations:    Insulin: Please consider increasing Lantus to 40 units Q24H, Novolog correction to 0-15 units Q4H, and Novolog 4 units TID with meals for meal coverage if patient eats at least 50% of meals.  NOTE: Noted consult for Diabetes Coordinator. Diabetes Coordinator is not on campus over the weekend but available by pager from 8am to 5pm for questions or concerns. Chart reviewed.   Thanks, Barnie Alderman, RN, MSN, CDE Diabetes Coordinator Inpatient Diabetes Program [phone] (Team Pager from 8am to 5pm)

## 2021-06-27 NOTE — Interval H&P Note (Signed)
History and Physical Interval Note:  06/27/2021 7:36 AM  Sarah Long  has presented today for surgery, with the diagnosis of GANGEN FOOT.  The various methods of treatment have been discussed with the patient and family. After consideration of risks, benefits and other options for treatment, the patient has consented to  Procedure(s): AMPUTATION BELOW KNEE (Left) as a surgical intervention.  The patient's history has been reviewed, patient examined, no change in status, stable for surgery.  I have reviewed the patient's chart and labs.  Questions were answered to the patient's satisfaction.     Newt Minion

## 2021-06-27 NOTE — Significant Event (Signed)
Spoke with on-call orthopedist regarding Dr. Jess Barters recommendations for this patient's right foot osteomyelitis.  This orthopedist confirms that Dr. Sharol Given would like to do 4 weeks of antibiotics with doxycycline and follow-up outpatient in 4 weeks.  Dr. Sharol Given prefers this as double amputation would be quite difficult on the patient.  Would like to see how she responds to antibiotic treatment with reevaluation of the right foot ulcer in 4 weeks.  Ezequiel Essex, MD PGY-2, [place] [facility] pager [phone]

## 2021-06-27 NOTE — Op Note (Signed)
   Date of Surgery: 06/27/2021  INDICATIONS: Sarah Long is a 50 y.o.-year-old female who presents with sepsis secondary to gangrene abscess and ascending necrotizing fasciitis left foot.  Patient is 1 week status post arteriogram of the left lower extremity.[name]  PREOPERATIVE DIAGNOSIS: Abscess, gangrene, necrotizing fasciitis left foot  POSTOPERATIVE DIAGNOSIS: Same.  PROCEDURE: Transtibial amputation Application of Prevena wound VAC  SURGEON: Sharol Given, M.D.  ANESTHESIA:  general  IV FLUIDS AND URINE: See anesthesia records.  ESTIMATED BLOOD LOSS: See anesthesia records.  COMPLICATIONS: None.  DESCRIPTION OF PROCEDURE: The patient was brought to the operating room after undergoing regional anesthetic. After adequate levels of anesthesia were obtained patient's lower extremity was prepped using DuraPrep draped into a sterile field. A timeout was called. The foot was draped out of the sterile field with impervious stockinette. A transverse incision was made 11 cm distal to the tibial tubercle. This curved proximally and a large posterior flap was created. The tibia was transected 1 cm proximal to the skin incision. The fibula was transected just proximal to the tibial incision. The tibia was beveled anteriorly. A large posterior flap was created. The sciatic nerve was pulled cut and allowed to retract. The vascular bundles were suture ligated with 2-0 silk. The deep and superficial fascial layers were closed using #1 Vicryl. The skin was closed using staples and 2-0 nylon. The wound was covered with a Prevena customizable and arthroform wound VAC.  The dressing was sealed with dermatac there was a good suction fit. A prosthetic shrinker and limb protector were applied. Patient was taken to the PACU in stable condition.   DISCHARGE PLANNING:  Antibiotic duration: 24 hours  Weightbearing: Nonweightbearing on the operative extremity  Pain medication: Opioid pathway  Dressing care/ Wound VAC:  Continue wound VAC for 1 week after discharge  Discharge to: Discharge planning based on therapy's recommendations for possible inpatient rehabilitation, outpatient rehabilitation, or discharge to home with therapy  Follow-up: In the office 1 week post operative.  Meridee Score, MD [place] 8:49 AM

## 2021-06-27 NOTE — Anesthesia Preprocedure Evaluation (Signed)
Anesthesia Evaluation  Patient identified by MRN, date of birth, ID band Patient awake    Reviewed: Allergy & Precautions, NPO status , Patient's Chart, lab work & pertinent test results  Airway Mallampati: III  TM Distance: >3 FB Neck ROM: Full    Dental  (+) Dental Advisory Given   Pulmonary former smoker,    breath sounds clear to auscultation       Cardiovascular hypertension, Pt. on medications +CHF  + dysrhythmias  Rhythm:Regular Rate:Normal     Neuro/Psych  Neuromuscular disease    GI/Hepatic negative GI ROS, Neg liver ROS,   Endo/Other  diabetes, Poorly Controlled, Type 2, Insulin Dependent  Renal/GU ARFRenal disease     Musculoskeletal   Abdominal   Peds  Hematology  (+) anemia ,   Anesthesia Other Findings   Reproductive/Obstetrics                             Lab Results  Component Value Date   WBC 40.1 (H) 06/27/2021   HGB 7.7 (L) 06/27/2021   HCT 23.0 (L) 06/27/2021   MCV 84.9 06/27/2021   PLT 355 06/27/2021   Lab Results  Component Value Date   CREATININE 1.97 (H) 06/27/2021   BUN 64 (H) 06/27/2021   NA 133 (L) 06/27/2021   K 3.1 (L) 06/27/2021   CL 104 06/27/2021   CO2 19 (L) 06/27/2021    Anesthesia Physical Anesthesia Plan  ASA: 4  Anesthesia Plan: General   Post-op Pain Management:  Regional for Post-op pain   Induction: Intravenous  PONV Risk Score and Plan: 3 and Ondansetron, Dexamethasone, Treatment may vary due to age or medical condition and Midazolam  Airway Management Planned: LMA  Additional Equipment: None  Intra-op Plan:   Post-operative Plan: Extubation in OR  Informed Consent: I have reviewed the patients History and Physical, chart, labs and discussed the procedure including the risks, benefits and alternatives for the proposed anesthesia with the patient or authorized representative who has indicated his/her understanding and  acceptance.     Dental advisory given  Plan Discussed with: CRNA  Anesthesia Plan Comments:         Anesthesia Quick Evaluation

## 2021-06-27 NOTE — Progress Notes (Signed)
Inpatient Rehab Admissions Coordinator:  Consult received. Awaiting therapy reassessments post L BKA to help determine appropriate therapy venue for pt. Will continue to follow.   Gayland Curry, [place], [place] Admissions Coordinator [phone]

## 2021-06-28 ENCOUNTER — Encounter (HOSPITAL_COMMUNITY): Payer: Self-pay | Admitting: Orthopedic Surgery

## 2021-06-28 DIAGNOSIS — I96 Gangrene, not elsewhere classified: Secondary | ICD-10-CM | POA: Diagnosis not present

## 2021-06-28 LAB — COMPREHENSIVE METABOLIC PANEL
ALT: 13 U/L (ref 0–44)
AST: 17 U/L (ref 15–41)
Albumin: 1.3 g/dL — ABNORMAL LOW (ref 3.5–5.0)
Alkaline Phosphatase: 170 U/L — ABNORMAL HIGH (ref 38–126)
Anion gap: 8 (ref 5–15)
BUN: 53 mg/dL — ABNORMAL HIGH (ref 6–20)
CO2: 21 mmol/L — ABNORMAL LOW (ref 22–32)
Calcium: 7.6 mg/dL — ABNORMAL LOW (ref 8.9–10.3)
Chloride: 101 mmol/L (ref 98–111)
Creatinine, Ser: 1.61 mg/dL — ABNORMAL HIGH (ref 0.44–1.00)
GFR, Estimated: 39 mL/min — ABNORMAL LOW (ref >60)
Glucose, Bld: 455 mg/dL — ABNORMAL HIGH (ref 70–99)
Potassium: 3.7 mmol/L (ref 3.5–5.1)
Sodium: 130 mmol/L — ABNORMAL LOW (ref 135–145)
Total Bilirubin: 0.4 mg/dL (ref 0.3–1.2)
Total Protein: 6.5 g/dL (ref 6.5–8.1)

## 2021-06-28 LAB — MAGNESIUM: Magnesium: 1.7 mg/dL (ref 1.7–2.4)

## 2021-06-28 LAB — BPAM RBC
Blood Product Expiration Date: 202208102359
ISSUE DATE / TIME: 202207230528
Unit Type and Rh: 7300

## 2021-06-28 LAB — CBC WITH DIFFERENTIAL/PLATELET
Abs Immature Granulocytes: 1.43 10*3/uL — ABNORMAL HIGH (ref 0.00–0.07)
Basophils Absolute: 0.1 10*3/uL (ref 0.0–0.1)
Basophils Relative: 0 %
Eosinophils Absolute: 0.1 10*3/uL (ref 0.0–0.5)
Eosinophils Relative: 0 %
HCT: 26.4 % — ABNORMAL LOW (ref 36.0–46.0)
Hemoglobin: 8.7 g/dL — ABNORMAL LOW (ref 12.0–15.0)
Immature Granulocytes: 4 %
Lymphocytes Relative: 7 %
Lymphs Abs: 2.5 10*3/uL (ref 0.7–4.0)
MCH: 27.5 pg (ref 26.0–34.0)
MCHC: 33 g/dL (ref 30.0–36.0)
MCV: 83.5 fL (ref 80.0–100.0)
Monocytes Absolute: 0.6 10*3/uL (ref 0.1–1.0)
Monocytes Relative: 2 %
Neutro Abs: 28.8 10*3/uL — ABNORMAL HIGH (ref 1.7–7.7)
Neutrophils Relative %: 87 %
Platelets: 314 10*3/uL (ref 150–400)
RBC: 3.16 MIL/uL — ABNORMAL LOW (ref 3.87–5.11)
RDW: 17.1 % — ABNORMAL HIGH (ref 11.5–15.5)
WBC: 33.5 10*3/uL — ABNORMAL HIGH (ref 4.0–10.5)
nRBC: 0 % (ref 0.0–0.2)

## 2021-06-28 LAB — PHOSPHORUS: Phosphorus: 3.1 mg/dL (ref 2.5–4.6)

## 2021-06-28 LAB — TYPE AND SCREEN
ABO/RH(D): B POS
Antibody Screen: NEGATIVE
Unit division: 0

## 2021-06-28 LAB — PROTIME-INR
INR: 1.3 — ABNORMAL HIGH (ref 0.8–1.2)
Prothrombin Time: 16.4 seconds — ABNORMAL HIGH (ref 11.4–15.2)

## 2021-06-28 LAB — GLUCOSE, CAPILLARY
Glucose-Capillary: 450 mg/dL — ABNORMAL HIGH (ref 70–99)
Glucose-Capillary: 445 mg/dL — ABNORMAL HIGH (ref 70–99)
Glucose-Capillary: 352 mg/dL — ABNORMAL HIGH (ref 70–99)
Glucose-Capillary: 229 mg/dL — ABNORMAL HIGH (ref 70–99)
Glucose-Capillary: 173 mg/dL — ABNORMAL HIGH (ref 70–99)

## 2021-06-28 LAB — HCV INTERPRETATION

## 2021-06-28 LAB — HCV AB W REFLEX TO QUANT PCR: HCV Ab: <0.1 s/co ratio (ref 0.0–0.9)

## 2021-06-28 MED ORDER — ENOXAPARIN SODIUM 40 MG/0.4ML IJ SOSY
40.0000 mg | PREFILLED_SYRINGE | INTRAMUSCULAR | Status: DC
  Administered 2021-06-28 – 2021-06-30 (×3): 40 mg via SUBCUTANEOUS
  Filled 2021-06-28 (×3): qty 0.4

## 2021-06-28 MED ORDER — INSULIN ASPART 100 UNIT/ML IJ SOLN
20.0000 [IU] | Freq: Once | INTRAMUSCULAR | Status: AC
  Administered 2021-06-28: 20 [IU] via SUBCUTANEOUS

## 2021-06-28 MED ORDER — POTASSIUM CHLORIDE CRYS ER 20 MEQ PO TBCR
40.0000 meq | EXTENDED_RELEASE_TABLET | Freq: Once | ORAL | Status: AC
  Administered 2021-06-28: 40 meq via ORAL
  Filled 2021-06-28: qty 2

## 2021-06-28 MED ORDER — INSULIN ASPART 100 UNIT/ML IJ SOLN: 0.0000 [IU] | Freq: Every day | INTRAMUSCULAR | Status: DC

## 2021-06-28 MED ORDER — INSULIN ASPART 100 UNIT/ML IJ SOLN
0.0000 [IU] | Freq: Three times a day (TID) | INTRAMUSCULAR | Status: DC
  Administered 2021-06-28: 7 [IU] via SUBCUTANEOUS
  Administered 2021-06-28: 20 [IU] via SUBCUTANEOUS
  Administered 2021-06-29: 11 [IU] via SUBCUTANEOUS
  Administered 2021-06-29: 7 [IU] via SUBCUTANEOUS
  Administered 2021-06-29: 15 [IU] via SUBCUTANEOUS
  Administered 2021-06-30: 4 [IU] via SUBCUTANEOUS
  Administered 2021-06-30: 7 [IU] via SUBCUTANEOUS
  Administered 2021-06-30: 4 [IU] via SUBCUTANEOUS
  Administered 2021-07-01: 3 [IU] via SUBCUTANEOUS

## 2021-06-28 NOTE — Progress Notes (Signed)
CBG 450. MD made aware. See new orders and will recheck in hour.   CBG recheck 445. MD made aware. See new orders and will recheck in hour.   CBG recheck 352, SSI given

## 2021-06-28 NOTE — Progress Notes (Signed)
Inpatient Rehab Admissions:  Inpatient Rehab Consult received.  I met with patient at the bedside for rehabilitation assessment and to discuss goals and expectations of an inpatient rehab admission.  Pt acknowledged understanding. Pt interested in pursuing CIR.  Still await updated OT notes.  Will continue to follow.  Signed: Gayland Curry, [place], [place] Admissions Coordinator [phone]

## 2021-06-28 NOTE — Evaluation (Signed)
Physical Therapy RE-Evaluation (post-op BKA) Patient Details Name: Sarah Long MRN: [id number] DOB: 09/13/1971 Today's Date: 06/28/2021   History of Present Illness  Pt is a 50 y/o female admitted secondary to worsening fatigue, polydipsia and nausea in the setting of DKA. Pt admitted due to necrotizing fasciitis of L foot, uncontrolled DM2 and metabolic encephalopathy. 7/23 underwent L BKA. Noted osteo rt foot with foot ulcers   PMH but not limited to: T2DM, HTN, HLD, CHF, pancreatitis, hx of ETOh abuse, pacemaker implant (2021), bipolar and depression  Clinical Impression   Pt admitted secondary to problem above with deficits below. PTA patient was independent with mobility and living in hotel with her 27, 4, and 38 yo children.  Pt currently requires mod assist to stand from standard height surface and will need +2 assist to attempt stand-pivot or squat-pivot transfers. Very motivated and eager to get home to her children.  Anticipate patient will benefit from PT to address problems listed below.Will continue to follow acutely to maximize functional mobility independence and safety.       Follow Up Recommendations CIR    Equipment Recommendations  Rolling walker with 5" wheels;Wheelchair (measurements PT);Wheelchair cushion (measurements PT)    Recommendations for Other Services Rehab consult     Precautions / Restrictions Precautions Precautions: Fall Required Braces or Orthoses: Other Brace Other Brace: residual limb guard Restrictions Other Position/Activity Restrictions: no orders related to RIGHT foot      Mobility  Bed Mobility Overal bed mobility: Needs Assistance Bed Mobility: Sit to Supine;Rolling;Sidelying to Sit Rolling: Modified independent (Device/Increase time) Sidelying to sit: Min guard   Sit to supine: Min guard   General bed mobility comments: HOB flat with rail; increased time and cues for sequencing; minguard for safety    Transfers Overall transfer  level: Needs assistance Equipment used: Rolling walker (2 wheeled) Transfers: Sit to/from Stand Sit to Stand: Min assist;From elevated surface Stand pivot transfers:  (unable with gripper sock)       General transfer comment: pt required multimodal cueing for appropriate hand placement and technique,  Ambulation/Gait             General Gait Details: did not attempt as pt unable to bear enough weight thru UEs to "hop"  Stairs            Wheelchair Mobility    Modified Rankin (Stroke Patients Only)       Balance Overall balance assessment: Needs assistance Sitting-balance support: Feet supported Sitting balance-Leahy Scale: Good     Standing balance support: During functional activity;Bilateral upper extremity supported;Single extremity supported Standing balance-Leahy Scale: Poor                               Pertinent Vitals/Pain Pain Assessment: Faces Faces Pain Scale: Hurts whole lot Pain Location: perineum with pericare; denies pain in LLE or rt foot Pain Descriptors / Indicators: Grimacing Pain Intervention(s): Limited activity within patient's tolerance;Other (comment) (RN informed)    Home Living Family/patient expects to be discharged to:: Private residence Living Arrangements: Spouse/significant other   Type of Home: Other(Comment) (hotel) Home Access: Level entry     Home Layout: [place]: None Additional Comments: pt reported living in a hotel with her children 46 and 64 yo (currently with their dad); 22 yo staying with her 25 yo child    Prior Function Level of Independence: Independent  Hand Dominance   Dominant Hand: Right    Extremity/Trunk Assessment   Upper Extremity Assessment Upper Extremity Assessment: Defer to OT evaluation    Lower Extremity Assessment Lower Extremity Assessment: RLE deficits/detail;LLE deficits/detail;Generalized weakness RLE Deficits / Details: ulcers  plantar aspect of 1st toe and 1st MTP joint RLE Sensation: history of peripheral neuropathy LLE Deficits / Details: new BKA; limbguard removed and has 0-100 knee flexion; hip 3+ LLE Sensation: history of peripheral neuropathy    Cervical / Trunk Assessment Cervical / Trunk Assessment: Normal  Communication   Communication: No difficulties  Cognition Arousal/Alertness: Awake/alert Behavior During Therapy: WFL for tasks assessed/performed Overall Cognitive Status: No family/caregiver present to determine baseline cognitive functioning Area of Impairment: Attention;Following commands;Problem solving                   Current Attention Level: Selective   Following Commands: Follows one step commands with increased time;Follows one step commands consistently   Awareness: Intellectual Problem Solving: Slow processing;Difficulty sequencing;Requires verbal cues General Comments: Required incr time to process and cues for how to roll and how to sit up      General Comments General comments (skin integrity, edema, etc.): on initial side to sit pt reported feeling "funny" just as her telemetry showed "v-tach" and RN entering room. RN denied actual vtach but that pt has a wide waveform due to the placement of her pacemaker leads. Pt denied chest pain or dizziness--states she felt like she needed to pee. Assisted on/off bedpan and returned to sitting EOB for further assessment. HR 74-106 during session    Exercises Amputee Exercises Hip ABduction/ADduction: AAROM;Left;10 reps Hip Flexion/Marching: AROM;Left;10 reps Knee Flexion: AROM;Left;10 reps Knee Extension: AROM;Left;10 reps (short arc quad) Straight Leg Raises: AROM;Left;10 reps   Assessment/Plan    PT Assessment Patient needs continued PT services  PT Problem List Decreased strength;Decreased activity tolerance;Decreased balance;Decreased mobility;Decreased cognition;Decreased knowledge of use of DME;Decreased knowledge of  precautions;Decreased safety awareness;Pain;Impaired sensation;Obesity       PT Treatment Interventions DME instruction;Gait training;Stair training;Functional mobility training;Therapeutic activities;Therapeutic exercise;Neuromuscular re-education;Balance training;Patient/family education;Cognitive remediation    PT Goals (Current goals can be found in the Care Plan section)  Acute Rehab PT Goals Patient Stated Goal: to be able to get on [facility] PT Goal Formulation: With patient Time For Goal Achievement: 07/12/21 Potential to Achieve Goals: Fair    Frequency Min 3X/week   Barriers to discharge Decreased caregiver support young children; ?significant other; 47 you son    Co-evaluation               AM-PAC PT "6 Clicks" Mobility  Outcome Measure Help needed turning from your back to your side while in a flat bed without using bedrails?: None Help needed moving from lying on your back to sitting on the side of a flat bed without using bedrails?: A Little Help needed moving to and from a bed to a chair (including a wheelchair)?: A Lot Help needed standing up from a chair using your arms (e.g., wheelchair or bedside chair)?: A Lot Help needed to walk in hospital room?: Total Help needed climbing 3-5 steps with a railing? : Total 6 Click Score: 13    End of Session Equipment Utilized During Treatment: Gait belt Activity Tolerance: Patient tolerated treatment well Patient left: in bed;with call bell/phone within reach;with bed alarm set Nurse Communication: Mobility status;Other (comment) (sore perineum; ?use of purewick) PT Visit Diagnosis: Other abnormalities of gait and mobility (R26.89);Muscle weakness (generalized) (M62.81)  Time: [id number] PT Time Calculation (min) (ACUTE ONLY): 42 min   Charges:   PT Evaluation $PT Re-evaluation: 1 Re-eval PT Treatments $Therapeutic Exercise: 8-22 mins $Therapeutic Activity: 8-22 mins         Arby Barrette, PT Pager [phone]   Rexanne Mano 06/28/2021, 11:46 AM

## 2021-06-28 NOTE — Progress Notes (Signed)
FPTS Brief Progress Note  S Saw patient at bedside this evening. Patient was sleeping comfortably. I did not wake the patient. MEWS 0.   O: BP 129/70 (BP Location: Right Arm)   Pulse 73   Temp 97.6 F (36.4 C) (Oral)   Resp 19   Ht '5\' 4"'$  (1.626 m)   Wt 90.7 kg   SpO2 100%   BMI 34.33 kg/m    General: sleeping comfortably   A/P: Plan per day team  - Orders reviewed. Labs for AM ordered, which was adjusted as needed.   Lattie Haw, MD 06/28/2021, 8:26 PM PGY-3, [facility] Night Resident  Please page [phone] with questions.

## 2021-06-28 NOTE — Plan of Care (Signed)

## 2021-06-28 NOTE — Progress Notes (Signed)
Inpatient Diabetes Program Recommendations  AACE/ADA: New Consensus Statement on Inpatient Glycemic Control   Target Ranges:  Prepandial:   less than 140 mg/dL      Peak postprandial:   less than 180 mg/dL (1-2 hours)      Critically ill patients:  140 - 180 mg/dL   Results for Sarah Long, Sarah Long (MRN [id number]) as of 06/28/2021 09:30  Ref. Range 06/27/2021 07:11 06/27/2021 08:45 06/27/2021 11:41 06/27/2021 16:16 06/27/2021 21:18 06/28/2021 07:38  Glucose-Capillary Latest Ref Range: 70 - 99 mg/dL 236 (H) 223 (H) 289 (H) 394 (H) 404 (H) 450 (H)    Review of Glycemic Control  Diabetes history: DM2 Outpatient Diabetes medications: Lantus 40 units QHS, Novolog 10-20 units TID with meals Current orders for Inpatient glycemic control: Lantus 30 units Q24H, Novolog 0-20 units TID, Novolog 0-5 units QHS   Inpatient Diabetes Program Recommendations:     Insulin: Please consider increasing Lantus to 40 units Q24H (to start this am) and Novolog 5 units TID with meals for meal coverage if patient eats at least 50% of meals.   NOTE: Noted consult for Diabetes Coordinator. Diabetes Coordinator is not on campus over the weekend but available by pager from 8am to 5pm for questions or concerns. Chart reviewed.   Thanks Barnie Alderman, RN, MSN, CDE Diabetes Coordinator Inpatient Diabetes Program [phone] (Team Pager from 8am to 5pm)

## 2021-06-28 NOTE — Plan of Care (Signed)
  Problem: Clinical Measurements: Goal: Respiratory complications will improve Outcome: Progressing   Problem: Clinical Measurements: Goal: Cardiovascular complication will be avoided Outcome: Progressing   Problem: Coping: Goal: Level of anxiety will decrease Outcome: Progressing   Problem: Pain Managment: Goal: General experience of comfort will improve Outcome: Progressing   

## 2021-06-28 NOTE — Progress Notes (Signed)
Family Medicine Teaching Service Daily Progress Note Intern Pager: [phone]  Patient name: Sarah Long Medical record number: [id number] Date of birth: Sep 17, 1971 Age: 50 y.o. Gender: female  Primary Care Provider: [name], MD Consultants: Manson Passey, Vascular Code Status: Full  Pt Overview and Major Events to Date:  7/21 admitted 7/23 left transtibial amputation  Assessment and Plan: Patient is a 50 year old female presenting with bilateral foot wound, L>R.  Necrotizing Fascitis found with    Necrotizing Fascitis of left foot s/p BKA Finished course of Vanc, Cefepime and Clindamycin.  Start Doxycysline today and continue for 4 weeks.  WBC- 40.1>33.5. -Ortho following, appreciate recs - Monitor closely for C Dif - Continue to monitor WBC - Ortho follow-up in 4 weeks - PT/OT eval and treat   Osteomyelitis of Right foot Ortho indicating continue Doxycycline for 4 weeks. - follow up with Ortho in outpatient 4 weeks  Encephelopathy Improved.  Patient alert and oriented x4 this AM.   - Continue to monitor  Type 2 Diabetes Blood sugar 450 this AM.  Given 20 U of Novolog.   - follow up CBG in 1 hour - Increase Lantus to 30U - Increase to rSSI  Anemia 8.7 today.  Patient received transfusion of 1U pRBC's yesterday. - Daily CBC  GoC Pateint homeles.s  Had issues with accessing medications. - ToC following   FEN/GI: Carb controlled PPx: Lovenox Dispo: Pending PT/OT recs. Barriers include  continued healing.   Subjective:  Indicates pain well controlled at this time.  Expresses concern regarding left foot, dnies any other issues.  Objective: Temp:  [97.6 F (36.4 C)-98.8 F (37.1 C)] 97.6 F (36.4 C) (07/24 0742) Pulse Rate:  [72-79] 74 (07/24 0742) Resp:  [11-20] 18 (07/24 0742) BP: (120-158)/(66-85) 151/85 (07/24 0742) SpO2:  [96 %-100 %] 100 % (07/24 0742) Physical Exam: General: NAD Cardiovascular: RRR, Normal heart sounds Respiratory: CTAB Neuro:   No focal deficit, Alert and Oriented x 4 Extremities: Left BKA, wound of left foot stable  Laboratory: Recent Labs  Lab 06/26/21 0144 06/26/21 0625 06/27/21 0123  WBC 35.0* 42.7* 40.1*  HGB 8.7* 8.8* 7.7*  HCT 27.0* 27.4* 23.0*  PLT 406* 404* 355   Recent Labs  Lab 06/26/21 0144 06/26/21 0625 06/26/21 1103 06/27/21 0123 06/28/21 0500  NA 129*   < > 133* 133* 130*  K 6.1*   < > 5.5* 3.1* 3.7  CL 98   < > 102 104 101  CO2 17*   < > 20* 19* 21*  BUN 83*   < > 76* 64* 53*  CREATININE 3.12*   < > 2.54* 1.97* 1.61*  CALCIUM 7.6*   < > 7.7* 7.7* 7.6*  PROT 6.9  --   --  6.6 6.5  BILITOT 2.5*  --   --  0.4 0.4  ALKPHOS 81  --   --  106 170*  ALT 18  --   --  15 13  AST 52*  --   --  19 17  GLUCOSE 464*   < > 238* 264* 455*   < > = values in this interval not displayed.      Imaging/Diagnostic Tests: EXAM: PORTABLE CHEST 1 VIEW   COMPARISON:  06/25/2021   FINDINGS: Right IJ central venous catheter tip is at the cavoatrial junction. No focal airspace consolidation or pulmonary edema. No pleural effusion or pneumothorax.   IMPRESSION: Right IJ central venous catheter tip at the cavoatrial junction.  Electronically Signed   By: Ulyses Jarred M.D.   On: 06/27/2021 03:24  Delora Fuel, MD 06/28/2021, 8:11 AM PGY-1, [place] Intern pager: [phone], text pages welcome

## 2021-06-28 NOTE — Progress Notes (Signed)
FPTS Interim Night Progress Note  S:Patient sleeping comfortably.  Rounded with primary night RN.  No concerns voiced.    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  Last ECHO 2021 showed speckled myocardium.   Uncontrolled DM. CBG remains elevated 200's-400's.  CBG 404 after receiving 15 units Aspart and 26 units Lantus.  Will given and additional dose of 5 units Aspart  Titrate insulin to maintain glucose <180  Carollee Leitz MD PGY-3, [facility] pager [phone]

## 2021-06-29 DIAGNOSIS — I96 Gangrene, not elsewhere classified: Secondary | ICD-10-CM | POA: Diagnosis not present

## 2021-06-29 LAB — COMPREHENSIVE METABOLIC PANEL
ALT: 12 U/L (ref 0–44)
AST: 17 U/L (ref 15–41)
Albumin: 1.3 g/dL — ABNORMAL LOW (ref 3.5–5.0)
Alkaline Phosphatase: 70 U/L (ref 38–126)
Anion gap: 7 (ref 5–15)
BUN: 41 mg/dL — ABNORMAL HIGH (ref 6–20)
CO2: 23 mmol/L (ref 22–32)
Calcium: 7.8 mg/dL — ABNORMAL LOW (ref 8.9–10.3)
Chloride: 105 mmol/L (ref 98–111)
Creatinine, Ser: 1.38 mg/dL — ABNORMAL HIGH (ref 0.44–1.00)
GFR, Estimated: 48 mL/min — ABNORMAL LOW (ref >60)
Glucose, Bld: 280 mg/dL — ABNORMAL HIGH (ref 70–99)
Potassium: 3.8 mmol/L (ref 3.5–5.1)
Sodium: 135 mmol/L (ref 135–145)
Total Bilirubin: 0.5 mg/dL (ref 0.3–1.2)
Total Protein: 6.4 g/dL — ABNORMAL LOW (ref 6.5–8.1)

## 2021-06-29 LAB — GLUCOSE, CAPILLARY
Glucose-Capillary: 322 mg/dL — ABNORMAL HIGH (ref 70–99)
Glucose-Capillary: 278 mg/dL — ABNORMAL HIGH (ref 70–99)
Glucose-Capillary: 211 mg/dL — ABNORMAL HIGH (ref 70–99)
Glucose-Capillary: 143 mg/dL — ABNORMAL HIGH (ref 70–99)

## 2021-06-29 LAB — CBC
HCT: 26.2 % — ABNORMAL LOW (ref 36.0–46.0)
Hemoglobin: 8.2 g/dL — ABNORMAL LOW (ref 12.0–15.0)
MCH: 27.1 pg (ref 26.0–34.0)
MCHC: 31.3 g/dL (ref 30.0–36.0)
MCV: 86.5 fL (ref 80.0–100.0)
Platelets: 300 10*3/uL (ref 150–400)
RBC: 3.03 MIL/uL — ABNORMAL LOW (ref 3.87–5.11)
RDW: 17.1 % — ABNORMAL HIGH (ref 11.5–15.5)
WBC: 22.5 10*3/uL — ABNORMAL HIGH (ref 4.0–10.5)
nRBC: 0 % (ref 0.0–0.2)

## 2021-06-29 MED ORDER — INSULIN GLARGINE 100 UNIT/ML ~~LOC~~ SOLN
10.0000 [IU] | Freq: Once | SUBCUTANEOUS | Status: AC
  Administered 2021-06-29: 10 [IU] via SUBCUTANEOUS
  Filled 2021-06-29: qty 0.1

## 2021-06-29 MED ORDER — LURASIDONE HCL 40 MG PO TABS
40.0000 mg | ORAL_TABLET | Freq: Every day | ORAL | Status: DC
  Administered 2021-06-29 – 2021-06-30 (×2): 40 mg via ORAL
  Filled 2021-06-29 (×2): qty 1

## 2021-06-29 MED ORDER — INSULIN GLARGINE 100 UNIT/ML ~~LOC~~ SOLN
40.0000 [IU] | SUBCUTANEOUS | Status: DC
  Administered 2021-06-30 – 2021-07-01 (×2): 40 [IU] via SUBCUTANEOUS
  Filled 2021-06-29 (×3): qty 0.4

## 2021-06-29 MED ORDER — PANCRELIPASE (LIP-PROT-AMYL) 12000-38000 UNITS PO CPEP
24000.0000 [IU] | ORAL_CAPSULE | Freq: Three times a day (TID) | ORAL | Status: DC
  Administered 2021-06-29 – 2021-07-01 (×6): 24000 [IU] via ORAL
  Filled 2021-06-29 (×8): qty 2

## 2021-06-29 NOTE — Plan of Care (Signed)
  Problem: Pain Managment: Goal: General experience of comfort will improve Outcome: Progressing   Problem: Safety: Goal: Ability to remain free from injury will improve Outcome: Progressing   Problem: Metabolic: Goal: Ability to maintain appropriate glucose levels will improve Outcome: Progressing

## 2021-06-29 NOTE — Progress Notes (Signed)
Family Medicine Teaching Service Daily Progress Note Intern Pager: [phone]  Patient name: Sarah Long Medical record number: [id number] Date of birth: [birth year] Age: 50 y.o. Gender: female  Primary Care Provider: [name], MD Consultants: Vascular, Ortho, ID Code Status: Full  Pt Overview and Major Events to Date:  7/21 Admitted 7/23 Left Transtibial amputation  Assessment and Plan: [name] is a 50 year old female who was admitted for DKA, Necrotizing Fasciculitis of the left foot and osteomyelitis ofn the right foot. PMH is significant for uncontrolled T2DM, HTN, CHF, HLD, Chronic pancreatitis, Bipolar depression and Alcohol use disorder  Necrotizing Fasciitis of the Left Foot S/p Transtibial Amputation Patient is post op day 2. She said her pain is well controlled and report having a bowel movement this morning. She currently has an orthopedic device over the left amputated leg.  Ortho following, appreciate recs -PT/OT to determine CIR status -Continue doxycycline '100mg'$  BID (day 2 of 30) -Tylenol '500mg'$  q3h prn -Dilaudid 0.5-'1mg'$  q4h prn -Oxycodone 10-'15mg'$  q4h prn  Right foot Osteomyelitis Following amputation on the left foot, ortho recommended treatment with antibiotics and revaluation of the right foot in 4 weeks to determine if an imputation will be needed, considering double  amputation will be difficult on patient. -Ortho following, appreciate recs -Continue Doxycycline 100 mg BID (day 2 of 30) -Ortho to reevaluate after 4 weeks  Type 2 Diabetes Myelitis BG this morning is 280. -Increase lantus to 40U -Continue patient on sliding scale insulin  -continue carb modified diet  Hypertension BP this morning was 138/80. Patient is not on any home med for BP. Recommend  to follow up with PCP.  FEN/GI: Carb Modified diet PPx: S Dispo:Pending PT recommendations  in 2-3 days. Barriers include in patient PT.   Subjective:  Patient said she is doing well today  and was able to sleep without any issues. Her only complaint was increased pain that started 2 hours ago (7am). She rates the pain 5/10. Patient said her pain for the most part is controlled.   Objective: Temp:  [97.5 F (36.4 C)-97.6 F (36.4 C)] 97.6 F (36.4 C) (07/25 0359) Pulse Rate:  [73-76] 73 (07/24 1556) Resp:  [12-19] 12 (07/25 0009) BP: (109-151)/(70-85) 138/80 (07/25 0359) SpO2:  [100 %] 100 % (07/24 1556) Physical Exam: General:Awake, oriented x4, well developed, NAD Cardiovascular: Tachycardiac, no murmur Respiratory: CTAB, No wheezing Abdomen: No distension or tenderness  Extremities: amputated left foot is wrapped in orthopedic device. The foot wound is black with no drainage   Laboratory: Recent Labs  Lab 06/27/21 0123 06/28/21 1439 06/29/21 0500  WBC 40.1* 33.5* 22.5*  HGB 7.7* 8.7* 8.2*  HCT 23.0* 26.4* 26.2*  PLT 355 314 300   Recent Labs  Lab 06/27/21 0123 06/28/21 0500 06/29/21 0500  NA 133* 130* 135  K 3.1* 3.7 3.8  CL 104 101 105  CO2 19* 21* 23  BUN 64* 53* 41*  CREATININE 1.97* 1.61* 1.38*  CALCIUM 7.7* 7.6* 7.8*  PROT 6.6 6.5 6.4*  BILITOT 0.4 0.4 0.5  ALKPHOS 106 170* 70  ALT '15 13 12  '$ AST '19 17 17  '$ GLUCOSE 264* 455* 280*      Imaging/Diagnostic Tests: No Image studies   Alen Bleacher, MD 06/29/2021, 5:59 AM PGY-1, [place] Intern pager: [phone], text pages welcome

## 2021-06-29 NOTE — Progress Notes (Addendum)
Inpatient Rehab Admissions Coordinator:  Saw pt at bedside. Informed her that will continue to follow medical workup and progress and tolerance with therapies.  Pt also gave permission to contact daughter, Dortha Kern. Attempted to contact daughter; unable to leave message.   AddendumDortha Kern called AC. She acknowledged understanding of CIR goals and expectations. She is interested in pt pursuing CIR.   Gayland Curry, [place], [place] Admissions Coordinator [phone]

## 2021-06-29 NOTE — Progress Notes (Signed)
FPTS Brief Progress Note  S Saw patient at bedside this evening.  She was awake and states she is having some pain in her left leg and some nausea.  No other concerns at this time    O: BP (!) 141/73   Pulse 70   Temp 98.2 F (36.8 C) (Oral)   Resp 16   Ht '5\' 4"'$  (1.626 m)   Wt 90.7 kg   SpO2 99%   BMI 34.33 kg/m    General: Alert, no acute distress Pulm: normal work of breathing   A/P: Plan per day team   Hypertension -Spike in blood pressure on exam, 182/98.  This came down to 135/84  Pain in left leg -Dilaudid given  Nausea -Zofran prn  - Orders reviewed. Labs for AM ordered, which was adjusted as needed.   Precious Gilding, DO 06/30/2021, 1:04 AM PGY-3,  Family Medicine Night Resident  Please page [phone] with questions.

## 2021-06-29 NOTE — Progress Notes (Addendum)
Family Medicine Teaching Service Daily Progress Note Intern Pager: [phone]  Patient name: Sarah Long Medical record number: [id number] Date of birth: 1971-08-22 Age: 50 y.o. Gender: female  Primary Care Provider: [name], MD Consultants: Vascular, Ortho, ID Code Status: Full  Pt Overview and Major Events to Date:  7/12 Admitted 7/23 Left transtibial amputation  Assessment and Plan: Ms Sarah Long is a 50 yo female presenting with DKA, left foot necrotizing fasciitis and right foot osteomyelitis. PMH is significant for T2DM, CHF, HTN, HLD, Chronic Pancreatitis, Bipolar depression and Alcohol use disorder.  Left Foot Necrotizing Fasciitis  Patient post op day 3. She has no complaint at this time and said her pain is well controlled. She did express desire to move around and be active. -Ortho Following, appreciate rec -continue doxycycline 100 mg BID (day 3 OF 30) -Tylenol 500 mg q3h prn -Dilaudid 0.5-1 mg q4h prn -Oxycodone 10-15 mg q4h prn -PT/OT -to be transitioned to CIR  Right toe osteomyelitis Ortho's plan is to reevaluate the right foot after 4 weeks on antibiotics. Patient denies any pain on her right big toe.  -Ortho following, appreciate recs -continue doxycycline 100 mg BID (day 3 OF 30) -We will follow-up with Ortho outpatient  Type 2 Diabetes Myelitis CBG this morning was 151.  -Continue monitoring CBG -continue sliding scale of insulin -Continue lantus 40U -Continue modified carb diet  Hypertension Blood pressure this morning was 149/84. -Started chlorthalidone 25 mg daily  FEN/GI: Carbs modified diet PPx: SCDs Dispo:Home in 2-3 days. Barriers include clinical status.   Subjective:  Patient states she is doing well and have no complaint.  She said her pain is well controlled and expressed interest in being more active and walking around.  Objective: Temp:  [97.4 F (36.3 C)-98.2 F (36.8 C)] 98.1 F (36.7 C) (07/25 2019) Pulse Rate:  [72-107]  76 (07/25 2019) Resp:  [12-16] 13 (07/25 1542) BP: (137-182)/(78-98) 182/98 (07/25 2019) SpO2:  [99 %-100 %] 100 % (07/25 2019) Physical Exam: General: Alert, well developed, NAD Cardiovascular: RRR, no murmurs Respiratory: Clear breath sounds bilaterally, no murmurs Abdomen: No abdominal distention or tenderness Extremities: LLE is wrapped up in Ortho dressing, RLE big toe is black and show no drainage.  No edema present  Laboratory: Recent Labs  Lab 06/27/21 0123 06/28/21 1439 06/29/21 0500  WBC 40.1* 33.5* 22.5*  HGB 7.7* 8.7* 8.2*  HCT 23.0* 26.4* 26.2*  PLT 355 314 300   Recent Labs  Lab 06/27/21 0123 06/28/21 0500 06/29/21 0500  NA 133* 130* 135  K 3.1* 3.7 3.8  CL 104 101 105  CO2 19* 21* 23  BUN 64* 53* 41*  CREATININE 1.97* 1.61* 1.38*  CALCIUM 7.7* 7.6* 7.8*  PROT 6.6 6.5 6.4*  BILITOT 0.4 0.4 0.5  ALKPHOS 106 170* 70  ALT '15 13 12  '$ AST '19 17 17  '$ GLUCOSE 264* 455* 280*    Imaging/Diagnostic Tests: No image studies   Sarah Bleacher, MD 06/29/2021, 10:21 PM PGY-1, [place] Intern pager: [phone], text pages welcome

## 2021-06-29 NOTE — Progress Notes (Addendum)
Inpatient Diabetes Program Recommendations  AACE/ADA: New Consensus Statement on Inpatient Glycemic Control (2015)  Target Ranges:  Prepandial:   less than 140 mg/dL      Peak postprandial:   less than 180 mg/dL (1-2 hours)      Critically ill patients:  140 - 180 mg/dL   Lab Results  Component Value Date   GLUCAP 322 (H) 06/29/2021   HGBA1C 13.6 (A) 06/18/2021    Review of Glycemic Control Results for JAKIAH, WATTLEY (MRN [id number]) as of 06/29/2021 09:14  Ref. Range 06/28/2021 12:27 06/28/2021 15:54 06/28/2021 21:30 06/29/2021 08:01  Glucose-Capillary Latest Ref Range: 70 - 99 mg/dL 352 (H) 229 (H) 173 (H) 322 (H)    Diabetes history: DM2 Outpatient Diabetes medications: Lantus 40 units QHS, Novolog 10-20 units TID with meals Current orders for Inpatient glycemic control: Lantus 30 units Q24H, Novolog 0-20 units TID, Novolog 0-5 units QHS   Inpatient Diabetes Program Recommendations:    Consider increasing Lantus to 40 units Q24H (to start this am) and Novolog 5 units TID with meals for meal coverage if patient eats at least 50% of meals.  Addendum: Spoke with patient regarding outpatient diabetes management. Admits to not taking Lantus regularly because "I didn't always have it." Missed multiple doses and cannot confirm through asking questions in multiple ways how often or if she has been taking this recently. Reviewed patient's current A1c of 13.6% Explained what a A1c is and what it measures. Also reviewed goal A1c with patient, importance of good glucose control @ home, and blood sugar goals. Reviewed patho of DM, impact of poor glycemic control, risk of infection, need for taking medications as prescribed, vascular changes and commorbidities. Patient dos not have a meter at home. Blood glucose meter [id number]). Reviewed frequency and when to call MD.  Denies consuming sugary beverages. Reviewed importance of ensuring limiting CHO intake when able.  Patient denies transportation  issues and reports having funds to obtain insulin through Medicaid $3. Has been followed by [facility]. Will place [facility] consult. However, unclear why patient does not have medication.    Thanks, Bronson Curb, MSN, RNC-OB Diabetes Coordinator [phone] (8a-5p)

## 2021-06-29 NOTE — Evaluation (Signed)
Occupational Therapy Re-Evaluation Patient Details Name: Sarah Long MRN: [id number] DOB: June 15, 1971 Today's Date: 06/29/2021    History of Present Illness Pt is a 50 y/o female admitted secondary to worsening fatigue, polydipsia and nausea in the setting of DKA. Pt admitted due to necrotizing fasciitis of L foot, uncontrolled DM2 and metabolic encephalopathy. 7/23 underwent L BKA. Noted osteo rt foot with foot ulcers   PMH but not limited to: T2DM, HTN, HLD, CHF, pancreatitis, hx of ETOh abuse, pacemaker implant (2021), bipolar and depression   Clinical Impression   Patient presenting for OT re-evaluation s/p L BKA. This date patient limited by emesis x3 but continued motivation to progress to recliner. Patient completes lateral scoot transfers with Min guard and increased time/effort with additional cues for sequencing. Patient limited by deficits listed below including generalized weakness and decreased activity tolerance and would benefit from continued acute OT services in prep for safe d/c to next level of care with recommendation for CIR.     Follow Up Recommendations  CIR    Equipment Recommendations  Other (comment) (Defer to next level of care.)    Recommendations for Other Services Rehab consult     Precautions / Restrictions Precautions Precautions: Fall Required Braces or Orthoses: Other Brace Other Brace: residual limb guard Restrictions Other Position/Activity Restrictions: no orders related to RIGHT foot      Mobility Bed Mobility Overal bed mobility: Needs Assistance Bed Mobility: Sit to Supine;Rolling;Sidelying to Sit Rolling: Modified independent (Device/Increase time) Sidelying to sit: Min guard   Sit to supine: Min guard   General bed mobility comments: HOB flat with rail; increased time and cues for sequencing; minguard for safety    Transfers Overall transfer level: Needs assistance Equipment used: None Transfers: Lateral/Scoot Transfers Sit to  Stand: Min guard         General transfer comment: Min guard and increased time/effort for lateral scoot transfer to recliner on R.    Balance Overall balance assessment: Needs assistance Sitting-balance support: Feet supported Sitting balance-Leahy Scale: Good     Standing balance support: During functional activity;Bilateral upper extremity supported;Single extremity supported Standing balance-Leahy Scale: Poor                             ADL either performed or assessed with clinical judgement   ADL Overall ADL's : Needs assistance/impaired     Grooming: Wash/dry hands;Cueing for safety;Cueing for sequencing;Sitting;Set up Grooming Details (indicate cue type and reason): Grooming seated EOB after 3 episodes of emesis.                               General ADL Comments: Patient limited by N/V and pain in L residual limb.     Vision Baseline Vision/History: Wears glasses Wears Glasses: Reading only Patient Visual Report: No change from baseline       Perception     Praxis      Pertinent Vitals/Pain Pain Assessment: Faces Faces Pain Scale: Hurts whole lot Pain Location: L residual limb Pain Descriptors / Indicators: Grimacing Pain Intervention(s): Limited activity within patient's tolerance;Monitored during session;Repositioned     Hand Dominance Right   Extremity/Trunk Assessment Upper Extremity Assessment Upper Extremity Assessment: Overall WFL for tasks assessed       Cervical / Trunk Assessment Cervical / Trunk Assessment: Normal   Communication Communication Communication: No difficulties   Cognition Arousal/Alertness: Awake/alert Behavior During  Therapy: WFL for tasks assessed/performed Overall Cognitive Status: No family/caregiver present to determine baseline cognitive functioning Area of Impairment: Attention;Following commands;Problem solving                   Current Attention Level: Selective   Following  Commands: Follows one step commands with increased time;Follows one step commands consistently   Awareness: Intellectual Problem Solving: Slow processing;Difficulty sequencing;Requires verbal cues General Comments: Requires increased time to process verbal information   General Comments  VSS on RA. Patient reporting nausea upon entry declining blue bag. Emesis x3 several minutes later. RN notified.    Exercises     Shoulder Instructions      Home Living Family/patient expects to be discharged to:: Private residence Living Arrangements: Spouse/significant other   Type of Home: Other(Comment) (hotel) Home Access: Level entry     [place]: One level     Bathroom Shower/Tub: Teacher, early years/pre: Standard Bathroom Accessibility: No   Home Equipment: None   Additional Comments: pt reported living in a hotel with her children 87 and 61 yo (currently with their dad); 53 yo staying with her 50 yo child      Prior Functioning/Environment Level of Independence: Independent                 OT Problem List: Decreased strength;Decreased range of motion;Decreased activity tolerance;Impaired balance (sitting and/or standing);Decreased safety awareness;Decreased knowledge of use of DME or AE;Pain      OT Treatment/Interventions: Self-care/ADL training;Therapeutic exercise;DME and/or AE instruction;Therapeutic activities;Patient/family education;Balance training    OT Goals(Current goals can be found in the care plan section) Acute Rehab OT Goals Patient Stated Goal: to be able to get on [facility] OT Goal Formulation: With patient Time For Goal Achievement: 07/13/21 Potential to Achieve Goals: Good ADL Goals Pt Will Perform Grooming: with modified independence;standing Pt Will Perform Upper Body Dressing: with modified independence Pt Will Perform Lower Body Dressing: with modified independence;sit to/from stand Pt Will Transfer to Toilet: with modified  independence;ambulating;regular height toilet Pt Will Perform Toileting - Clothing Manipulation and hygiene: with modified independence;sit to/from stand Pt Will Perform Tub/Shower Transfer: with modified independence;ambulating;shower seat;rolling walker  OT Frequency: Min 2X/week   Barriers to D/C:            Co-evaluation              AM-PAC OT "6 Clicks" Daily Activity     Outcome Measure Help from another person eating meals?: None Help from another person taking care of personal grooming?: A Little Help from another person toileting, which includes using toliet, bedpan, or urinal?: A Lot Help from another person bathing (including washing, rinsing, drying)?: A Lot Help from another person to put on and taking off regular upper body clothing?: A Little Help from another person to put on and taking off regular lower body clothing?: A Lot 6 Click Score: 16   End of Session Nurse Communication: Mobility status;Other (comment) (Emesis and response to treatment)  Activity Tolerance: Patient tolerated treatment well Patient left: in chair;with call bell/phone within reach;with chair alarm set  OT Visit Diagnosis: Unsteadiness on feet (R26.81);Other abnormalities of gait and mobility (R26.89);Repeated falls (R29.6)                Time: 1000-1033 OT Time Calculation (min): 33 min Charges:  OT General Charges $OT Visit: 1 Visit OT Evaluation $OT Re-eval: 1 Re-eval OT Treatments $Self Care/Home Management : 23-37 mins  Sarah Long H. OTR/L Supplemental  OT, Department of rehab services [phone]  Sarah Long R H. 06/29/2021, 10:55 AM

## 2021-06-29 NOTE — Progress Notes (Signed)
Physical Therapy Treatment Patient Details Name: Sarah Long MRN: [id number] DOB: [birth year] Today's Date: 06/29/2021    History of Present Illness Pt is a 50 y/o female admitted secondary to worsening fatigue, polydipsia and nausea in the setting of DKA. Pt admitted due to necrotizing fasciitis of L foot, uncontrolled DM2 and metabolic encephalopathy. 7/23 underwent L BKA. Noted osteo rt foot with foot ulcers   PMH but not limited to: T2DM, HTN, HLD, CHF, pancreatitis, hx of ETOh abuse, pacemaker implant (2021), bipolar and depression    PT Comments    Patient very nauseated, but wanting to try to use [facility] and return to bed. Able to perform 3 transfers with min assist with +2 for safety (first time trying squat-pivot and stand-pivot with RW). End of session pt not feeling well and feeling cool,clammy. BP 147/80  Nurse tech entered room and about to check blood sugar.     Follow Up Recommendations  CIR     Equipment Recommendations  Rolling walker with 5" wheels;Wheelchair (measurements PT);Wheelchair cushion (measurements PT)    Recommendations for Other Services       Precautions / Restrictions Precautions Precautions: Fall Required Braces or Orthoses: Other Brace Other Brace: residual limb guard Restrictions Other Position/Activity Restrictions: no orders related to RIGHT foot    Mobility  Bed Mobility Overal bed mobility: Needs Assistance Bed Mobility: Sit to Supine       Sit to supine: Min guard   General bed mobility comments: HOB flat ; increased time and cues for sequencing; minguard for safety    Transfers Overall transfer level: Needs assistance Equipment used: None Transfers: Stand Pivot Transfers;Squat Pivot Transfers Sit to Stand: Min assist Stand pivot transfers: Min assist;+2 safety/equipment Squat pivot transfers: Min assist;+2 safety/equipment     General transfer comment: from recliner to [facility] (over armrest); stood for pericare and pivoted to  recliner, then finally recliner to bed (pt not feeling well, holding emesis bag between transfers)  Ambulation/Gait             General Gait Details: deferred due to pt not feeling well/nauseated   Stairs             Wheelchair Mobility    Modified Rankin (Stroke Patients Only)       Balance Overall balance assessment: Needs assistance Sitting-balance support: Feet supported Sitting balance-Leahy Scale: Good     Standing balance support: During functional activity;Bilateral upper extremity supported;Single extremity supported Standing balance-Leahy Scale: Poor                              Cognition Arousal/Alertness: Awake/alert Behavior During Therapy: WFL for tasks assessed/performed Overall Cognitive Status: No family/caregiver present to determine baseline cognitive functioning Area of Impairment: Attention;Following commands;Problem solving                   Current Attention Level: Selective   Following Commands: Follows one step commands with increased time;Follows one step commands consistently Safety/Judgement: Decreased awareness of deficits Awareness: Intellectual Problem Solving: Slow processing;Difficulty sequencing;Requires verbal cues General Comments: As initiating sit to stand pt unable with sudden return to sitting and reported she forgot she didn't have her left leg to stand on      Exercises Amputee Exercises Quad Sets: AROM;Left;10 reps    General Comments        Pertinent Vitals/Pain Pain Assessment: No/denies pain    Home Living  Prior Function            PT Goals (current goals can now be found in the care plan section) Acute Rehab PT Goals Patient Stated Goal: to be able to get on [facility] Time For Goal Achievement: 07/12/21 Potential to Achieve Goals: Fair Progress towards PT goals: Progressing toward goals    Frequency    Min 3X/week      PT Plan Current plan  remains appropriate    Co-evaluation              AM-PAC PT "6 Clicks" Mobility   Outcome Measure  Help needed turning from your back to your side while in a flat bed without using bedrails?: None Help needed moving from lying on your back to sitting on the side of a flat bed without using bedrails?: A Little Help needed moving to and from a bed to a chair (including a wheelchair)?: A Little Help needed standing up from a chair using your arms (e.g., wheelchair or bedside chair)?: A Little Help needed to walk in hospital room?: Total Help needed climbing 3-5 steps with a railing? : Total 6 Click Score: 15    End of Session Equipment Utilized During Treatment: Gait belt Activity Tolerance: Treatment limited secondary to medical complications (Comment) (limited by perssistent nausea (vomiting earlier)) Patient left: in bed;with call bell/phone within reach;with bed alarm set;with nursing/sitter in room Nurse Communication: Mobility status (abel to transfer to [facility] with +1 assist) PT Visit Diagnosis: Other abnormalities of gait and mobility (R26.89);Muscle weakness (generalized) (M62.81)     Time: [id number] PT Time Calculation (min) (ACUTE ONLY): 33 min  Charges:  $Therapeutic Activity: 23-37 mins                      Arby Barrette, PT Pager [phone]    Sarah Long 06/29/2021, 2:48 PM

## 2021-06-30 DIAGNOSIS — I96 Gangrene, not elsewhere classified: Secondary | ICD-10-CM | POA: Diagnosis not present

## 2021-06-30 LAB — COMPREHENSIVE METABOLIC PANEL
ALT: 11 U/L (ref 0–44)
AST: 19 U/L (ref 15–41)
Albumin: 1.4 g/dL — ABNORMAL LOW (ref 3.5–5.0)
Alkaline Phosphatase: 71 U/L (ref 38–126)
Anion gap: 4 — ABNORMAL LOW (ref 5–15)
BUN: 30 mg/dL — ABNORMAL HIGH (ref 6–20)
CO2: 25 mmol/L (ref 22–32)
Calcium: 7.8 mg/dL — ABNORMAL LOW (ref 8.9–10.3)
Chloride: 108 mmol/L (ref 98–111)
Creatinine, Ser: 1.07 mg/dL — ABNORMAL HIGH (ref 0.44–1.00)
GFR, Estimated: >60 mL/min (ref >60)
Glucose, Bld: 158 mg/dL — ABNORMAL HIGH (ref 70–99)
Potassium: 3.6 mmol/L (ref 3.5–5.1)
Sodium: 137 mmol/L (ref 135–145)
Total Bilirubin: 0.3 mg/dL (ref 0.3–1.2)
Total Protein: 6.6 g/dL (ref 6.5–8.1)

## 2021-06-30 LAB — PROTIME-INR
INR: 1.3 — ABNORMAL HIGH (ref 0.8–1.2)
Prothrombin Time: 15.7 seconds — ABNORMAL HIGH (ref 11.4–15.2)

## 2021-06-30 LAB — SURGICAL PATHOLOGY

## 2021-06-30 LAB — CBC
HCT: 26.5 % — ABNORMAL LOW (ref 36.0–46.0)
Hemoglobin: 8.2 g/dL — ABNORMAL LOW (ref 12.0–15.0)
MCH: 27.2 pg (ref 26.0–34.0)
MCHC: 30.9 g/dL (ref 30.0–36.0)
MCV: 87.7 fL (ref 80.0–100.0)
Platelets: 308 10*3/uL (ref 150–400)
RBC: 3.02 MIL/uL — ABNORMAL LOW (ref 3.87–5.11)
RDW: 16.9 % — ABNORMAL HIGH (ref 11.5–15.5)
WBC: 20 10*3/uL — ABNORMAL HIGH (ref 4.0–10.5)
nRBC: 0 % (ref 0.0–0.2)

## 2021-06-30 LAB — GLUCOSE, CAPILLARY
Glucose-Capillary: 151 mg/dL — ABNORMAL HIGH (ref 70–99)
Glucose-Capillary: 180 mg/dL — ABNORMAL HIGH (ref 70–99)
Glucose-Capillary: 215 mg/dL — ABNORMAL HIGH (ref 70–99)
Glucose-Capillary: 175 mg/dL — ABNORMAL HIGH (ref 70–99)

## 2021-06-30 MED ORDER — MELATONIN 3 MG PO TABS
3.0000 mg | ORAL_TABLET | Freq: Every evening | ORAL | Status: DC | PRN
  Administered 2021-06-30: 3 mg via ORAL
  Filled 2021-06-30: qty 1

## 2021-06-30 MED ORDER — FAMOTIDINE 20 MG PO TABS
20.0000 mg | ORAL_TABLET | Freq: Every day | ORAL | Status: DC
  Administered 2021-06-30 – 2021-07-01 (×2): 20 mg via ORAL
  Filled 2021-06-30 (×2): qty 1

## 2021-06-30 MED ORDER — CHLORTHALIDONE 25 MG PO TABS
25.0000 mg | ORAL_TABLET | Freq: Every day | ORAL | Status: DC
  Administered 2021-06-30 – 2021-07-01 (×2): 25 mg via ORAL
  Filled 2021-06-30 (×2): qty 1

## 2021-06-30 NOTE — Hospital Course (Addendum)
[name] is a 50 yo female who presented to the ED with worsening fatigue, polydipsia and nausea in the setting of DKA. She also presented with left foot necrotizing fasciitis and right great toe osteomyelitis.   DKA in the setting of uncontrolled Type 2 Diabetes  Patient's CBG on arrival in the ED was 848 with lactic acid of 3.4 , Anion gap of 22, ph of 7.45, bicarb 15 and CO2 22.6. She was started on endotool and IV LR. Her CBG remained high at over 600 despite this intervention. After switch to normal saline and increased rate of 250 mL/hr, anion gap closed. She was then transitioned to 26U of lantus and sensitive sliding scale insulin. Patient's lantus and sliding scale insulin were adjusted daily for optimum control of the blood glucose which was checked every 4 hours. Discharged on home Lantus 40 units, mealtime 3 times daily insulin sliding scale.  PCP to adjust as necessary.  AKI in the setting of DKA Patient's creatinine on admission was 3.80 which is attributed to her DKA. She was treated with large volume IV fluid.  AKI resolved, on discharge creatinine was 1.07.   Necrotizing Fasciitis of the Left foot, now s/p left transtibial amputation On admission patient was found to have gangrene of all right 5 toes with edematous foot and raised skin tissue which was concerning for necrotizing Fasciitis. Necrotizing Fasciitis was confirmed with a Leg CT and patient was started on broad antibiotics of Vancomycin, IV clindamycin 600 mg and Cefepime 2 g. The orthopedic team was consulted who performed a transtibial amputation of the left foot.  Pain well controlled with IV dilaudid '1mg'$  prn, scheduled Tylenol 500 mg and PRN oxycodone 15 mg.  IV antibiotics discontinued 24 hours postop per Ortho recs.  PT/OT evaluated recommended patient sent to CIR for inpatient rehab.  Osteomyelitis of the right great toe During admission patient was found to have a non healing gangrenous ulcer of the right great  toe which prompted a CT of the right foot that revealed osteomyelitis of the right great toe. Ortho was consulted, declined surgical management of osteo due to concurrent need for left transtibial amputation for nec fasc. Ortho instead recommended starting patient on doxycycline 100 mg BID for 30 days with plan for reevaluation of the right toe in 4 weeks. During this hospitalization, patient also received vancomycin, clindamycin, and cefepime for the [facility] as discussed above.    Discharge recommendations:  F/U outpatient Cardiology consult: last ECHO 2021 showed speckled myocardium favoring amyloid.  Seen cards in 2021, started Entresto BID.   Reduced home sliding scale at time of discharge, had question if she was actually using this. Recommend consider if need adjustment of sliding scale insulin  Continue Antibiotics of doxycycline 100 mg for a month until follow up with ortho Dr. Sharol Given Follow up with orthopedic appointment

## 2021-06-30 NOTE — Progress Notes (Signed)
Inpatient Rehab Admissions Coordinator:   I do not have a bed for this Pt. On CIR today. Note that Pt. Received IV dilaudid last night. Discussed transitioning to managing her pain with oral pain meds with MD and pt. Are all in agreement (CIR does not offer IV pain medication). I will follow for potential admit pending medical readiness and bed availability.   Clemens Catholic, [place], [place] Admissions Coordinator  [phone] ([place]) [phone] (office)

## 2021-06-30 NOTE — Progress Notes (Signed)
Patient ID: Sarah Long, female   DOB: 30-Dec-1970, 50 y.o.   MRN: [id number] Patient is postoperative day 3 left transtibial amputation.  There is no drainage in the wound VAC canister there is a good suction fit.  Anticipate discharge to inpatient rehab if approved.

## 2021-06-30 NOTE — Progress Notes (Signed)
FPTS Brief Progress Note  S Saw patient at bedside this evening. She was lying in her bed peacefully. No acute concerns. MEWS 0.  O: BP (!) 143/83 (BP Location: Right Arm)   Pulse 77   Temp 97.7 F (36.5 C) (Oral)   Resp 17   Ht '5\' 4"'$  (1.626 m)   Wt 90.7 kg   SpO2 100%   BMI 34.33 kg/m    General: Alert, no acute distress  A/P: Plan per day team  - Orders reviewed. Labs for AM , which was adjusted as needed.    Lattie Haw, MD 06/30/2021, 7:53 PM PGY-3, [facility] Night Resident  Please page [phone] with questions.

## 2021-06-30 NOTE — Progress Notes (Addendum)
Physical Therapy Treatment Patient Details Name: Sarah Long MRN: [id number] DOB: [birth year] Today's Date: 06/30/2021    History of Present Illness Pt is a 50 y/o female admitted secondary to worsening fatigue, polydipsia and nausea in the setting of DKA. Pt admitted due to necrotizing fasciitis of L foot, uncontrolled DM2 and metabolic encephalopathy. 7/23 underwent L BKA. Noted osteo rt foot with foot ulcers   PMH but not limited to: T2DM, HTN, HLD, CHF, pancreatitis, hx of ETOh abuse, pacemaker implant (2021), bipolar and depression    PT Comments    Patient up in chair having eaten breakfast since last seen. Patient participated well in the exercises below. Donned limb-guard and shortened straps for easier donning/doffing (so strap is not behind or between her legs).     Follow Up Recommendations  CIR     Equipment Recommendations  Rolling walker with 5" wheels;Wheelchair (measurements PT);Wheelchair cushion (measurements PT)    Recommendations for Other Services Rehab consult     Precautions / Restrictions Precautions Precautions: Fall Required Braces or Orthoses: Other Brace Other Brace: residual limb guard Restrictions Other Position/Activity Restrictions: no orders related to RIGHT foot    Mobility  Bed Mobility                   Transfers              Ambulation/Gait            Stairs             Wheelchair Mobility    Modified Rankin (Stroke Patients Only)       Balance Overall balance assessment: Needs assistance Sitting-balance support: Feet supported Sitting balance-Leahy Scale: Good     Standing balance support: During functional activity;Bilateral upper extremity supported;Single extremity supported Standing balance-Leahy Scale: Poor                              Cognition Arousal/Alertness: Lethargic Behavior During Therapy: WFL for tasks assessed/performed Overall Cognitive Status: Within  Functional Limits for tasks assessed Area of Impairment: Attention                 Orientation Level:  (NT) Current Attention Level: Selective   Following Commands: Follows one step commands with increased time;Follows one step commands consistently   Awareness: Intellectual Problem Solving: Slow processing;Difficulty sequencing;Requires verbal cues General Comments: more lethargic on return for exercises; slower processing again seen      Exercises Amputee Exercises Quad Sets: AROM;Left;10 reps Hip ABduction/ADduction: Left;10 reps;AROM Hip Flexion/Marching: AROM;Left;10 reps Knee Flexion: AROM;Left;10 reps    General Comments       Pertinent Vitals/Pain Pain Assessment: No/denies pain    Home Living                      Prior Function            PT Goals (current goals can now be found in the care plan section) Acute Rehab PT Goals Patient Stated Goal: to be able to get on [facility] Time For Goal Achievement: 07/12/21 Potential to Achieve Goals: Fair Progress towards PT goals: Progressing toward goals    Frequency    Min 3X/week      PT Plan Current plan remains appropriate    Co-evaluation              AM-PAC PT "6 Clicks" Mobility   Outcome Measure  Help needed turning from  your back to your side while in a flat bed without using bedrails?: None Help needed moving from lying on your back to sitting on the side of a flat bed without using bedrails?: A Little Help needed moving to and from a bed to a chair (including a wheelchair)?: A Little Help needed standing up from a chair using your arms (e.g., wheelchair or bedside chair)?: A Little Help needed to walk in hospital room?: Total Help needed climbing 3-5 steps with a railing? : Total 6 Click Score: 15    End of Session Equipment Utilized During Treatment: Gait belt Activity Tolerance: Patient tolerated treatment well Patient left: with call bell/phone within reach;in chair;with  chair alarm set Nurse Communication: Mobility status PT Visit Diagnosis: Other abnormalities of gait and mobility (R26.89);Muscle weakness (generalized) (M62.81)     Time: [id number] PT Time Calculation (min) (ACUTE ONLY): 11 min  Charges:  $Gait Training: 8-22 mins $Therapeutic Exercise: 8-22 mins                      Arby Barrette, PT Pager [phone]    Rexanne Mano 06/30/2021, 11:14 AM

## 2021-06-30 NOTE — Progress Notes (Signed)
Physical Therapy Treatment Patient Details Name: Sarah Long MRN: [id number] DOB: [birth year] Today's Date: 06/30/2021    History of Present Illness Pt is a 50 y/o female admitted secondary to worsening fatigue, polydipsia and nausea in the setting of DKA. Pt admitted due to necrotizing fasciitis of L foot, uncontrolled DM2 and metabolic encephalopathy. 7/23 underwent L BKA. Noted osteo rt foot with foot ulcers   PMH but not limited to: T2DM, HTN, HLD, CHF, pancreatitis, hx of ETOh abuse, pacemaker implant (2021), bipolar and depression    PT Comments    Patient progressing with mobility, although still requires max cues for sequencing with RW and transfers (with min assist and 2nd person for multiple lines). She has appeared "foggy" on previous visits and today appeared more clear, yet required max cues for each transfer during session, despite using same technique and cues as on 06/29/21. Will require repetition to improve her safety with mobility. Progressed to ambulating 5 ft with RW (simulate typical distance into a bathroom if not wheelchair accessible)--limited distance for limb-sparing due to Rt foot ulcers and osteomyelitis with possibility pt will need amputation on RLE. No clear weight-bearing restrictions noted for RLE.    Follow Up Recommendations  CIR     Equipment Recommendations  Rolling walker with 5" wheels;Wheelchair (measurements PT);Wheelchair cushion (measurements PT)    Recommendations for Other Services       Precautions / Restrictions Precautions Precautions: Fall Required Braces or Orthoses: Other Brace Other Brace: residual limb guard Restrictions Other Position/Activity Restrictions: no orders related to RIGHT foot    Mobility  Bed Mobility               General bed mobility comments: up on BSC on arrival    Transfers Overall transfer level: Needs assistance Equipment used: Rolling walker (2 wheeled) Transfers: Stand Pivot Transfers;Sit  to/from Stand Sit to Stand: Min assist Stand pivot transfers: Min assist;+2 safety/equipment       General transfer comment: from [facility] stood for pericare; +2 for lines (monitors, IV, VAC) as pivoting to recliner (somewhat impulsively,but was feeling nauseated and immediately asked for emesis basin); stood from recliner with max cues for sequencing (using left hand on RW, PT stabilizing rt side of RW, and pushing with RUE off surface)  Ambulation/Gait Ambulation/Gait assistance: Min assist;+2 safety/equipment Gait Distance (Feet): 5 Feet Assistive device: Rolling walker (2 wheeled) Gait Pattern/deviations: Step-to pattern     General Gait Details: pt required cues for incr use of UEs to "cushion" her landing.Chair brought to pt to sit down as too much equipment and not enough room to turn around   [employer] Mobility    Modified Rankin (Stroke Patients Only)       Balance Overall balance assessment: Needs assistance Sitting-balance support: Feet supported Sitting balance-Leahy Scale: Good     Standing balance support: During functional activity;Bilateral upper extremity supported;Single extremity supported Standing balance-Leahy Scale: Poor                              Cognition Arousal/Alertness: Awake/alert Behavior During Therapy: WFL for tasks assessed/performed Overall Cognitive Status: Within Functional Limits for tasks assessed                   Orientation Level:  (NT)             General Comments: following commands more briskly (  WNL)      Exercises      General Comments General comments (skin integrity, edema, etc.): Removed limbguard per pt request due to feeling tight. Set up for breakfast with plan for PT to return after breakfast, complete exercises and don limbguard again      Pertinent Vitals/Pain Pain Assessment: No/denies pain    Home Living                      Prior Function             PT Goals (current goals can now be found in the care plan section) Acute Rehab PT Goals Patient Stated Goal: to be able to get on [facility] Time For Goal Achievement: 07/12/21 Potential to Achieve Goals: Fair Progress towards PT goals: Progressing toward goals    Frequency    Min 3X/week      PT Plan Current plan remains appropriate    Co-evaluation              AM-PAC PT "6 Clicks" Mobility   Outcome Measure  Help needed turning from your back to your side while in a flat bed without using bedrails?: None Help needed moving from lying on your back to sitting on the side of a flat bed without using bedrails?: A Little Help needed moving to and from a bed to a chair (including a wheelchair)?: A Little Help needed standing up from a chair using your arms (e.g., wheelchair or bedside chair)?: A Little Help needed to walk in hospital room?: Total Help needed climbing 3-5 steps with a railing? : Total 6 Click Score: 15    End of Session Equipment Utilized During Treatment: Gait belt Activity Tolerance: Patient tolerated treatment well Patient left: with call bell/phone within reach;in chair;with chair alarm set Nurse Communication: Mobility status PT Visit Diagnosis: Other abnormalities of gait and mobility (R26.89);Muscle weakness (generalized) (M62.81)     Time: [id number] PT Time Calculation (min) (ACUTE ONLY): 18 min  Charges:  $Gait Training: 8-22 mins                      Arby Barrette, PT Pager [phone]    Rexanne Mano 06/30/2021, 10:09 AM

## 2021-06-30 NOTE — Plan of Care (Signed)
  Problem: Metabolic: Goal: Ability to maintain appropriate glucose levels will improve Outcome: Progressing   Problem: Nutritional: Goal: Maintenance of adequate nutrition will improve Outcome: Progressing   Problem: Respiratory: Goal: Will regain and/or maintain adequate ventilation Outcome: Progressing   Problem: Urinary Elimination: Goal: Ability to achieve and maintain adequate renal perfusion and functioning will improve Outcome: Progressing

## 2021-06-30 NOTE — Discharge Instructions (Addendum)
Dear Sarah Long,  Thank you for letting us participate in your care. You were hospitalized for Diabetic Ketoacidosis, right great toe osteomyelitis and diagnosed with Gangrene of left foot ([place]). You were treated with IV fluid, Insulin, antibiotics and amputation of the left leg   POST-HOSPITAL & CARE INSTRUCTIONS You will need to follow up with the orthopedic surgeon Continue your course of antibiotic; doxycycline twice a day for 25 days  Continue physical therapy Go to your follow up appointments (listed below)   DOCTOR'S APPOINTMENT   Future Appointments  Date Time Provider [place]  07/02/2021  2:00 PM Tresa Endo, RPH-CPP [facility] None  07/07/2021  9:20 AM Wellington Hampshire, MD [facility]  07/10/2021  7:00 AM [facility] DEVICE REMOTES [facility]  07/30/2021 10:00 AM [name], MD [facility] None  10/09/2021  7:00 AM [facility] DEVICE REMOTES [facility]  01/08/2022  7:00 AM [facility] DEVICE REMOTES [facility]  04/09/2022  7:00 AM [facility] DEVICE REMOTES [facility]  07/09/2022  7:00 AM [facility] DEVICE REMOTES [facility]  10/08/2022  7:00 AM [facility] DEVICE REMOTES [facility]  01/07/2023  7:00 AM [facility] DEVICE REMOTES [facility]  04/08/2023  7:00 AM [facility] DEVICE REMOTES [facility]    Follow-up Information     Newt Minion, MD Follow up in 1 week(s).   Specialty: Orthopedic Surgery Contact information: [place] [address] [phone]         [name], MD. Schedule an appointment as soon as possible for a visit in 1 week(s).   Specialty: Pulmonary Disease Why: Make a follow up appointment with your primary care provider . Contact information: [address] [place] [address] [phone]         Sueanne Margarita, MD .   Specialty: Cardiology Contact information: [phone] [place] [address] [phone]         Constance Haw, MD .   Specialty: Cardiology Contact information: [place] [address] [phone]                 Take care and be well!  [facility]  [place], [place] [address] [phone]

## 2021-07-01 ENCOUNTER — Encounter (HOSPITAL_COMMUNITY): Payer: Self-pay | Admitting: Physical Medicine and Rehabilitation

## 2021-07-01 ENCOUNTER — Inpatient Hospital Stay (HOSPITAL_COMMUNITY)
Admission: RE | Admit: 2021-07-01 | Discharge: 2021-07-15 | DRG: 560 | Disposition: A | Discharge status: Home Health | Payer: Medicaid Other | Source: Intra-hospital | Attending: Physical Medicine and Rehabilitation | Admitting: Physical Medicine and Rehabilitation

## 2021-07-01 ENCOUNTER — Other Ambulatory Visit: Payer: Self-pay

## 2021-07-01 DIAGNOSIS — F319 Bipolar disorder, unspecified: Secondary | ICD-10-CM | POA: Diagnosis present

## 2021-07-01 DIAGNOSIS — Z4781 Encounter for orthopedic aftercare following surgical amputation: Secondary | ICD-10-CM | POA: Diagnosis present

## 2021-07-01 DIAGNOSIS — E785 Hyperlipidemia, unspecified: Secondary | ICD-10-CM | POA: Diagnosis present

## 2021-07-01 DIAGNOSIS — E1151 Type 2 diabetes mellitus with diabetic peripheral angiopathy without gangrene: Secondary | ICD-10-CM | POA: Diagnosis present

## 2021-07-01 DIAGNOSIS — Z6834 Body mass index (BMI) 34.0-34.9, adult: Secondary | ICD-10-CM

## 2021-07-01 DIAGNOSIS — Z79899 Other long term (current) drug therapy: Secondary | ICD-10-CM

## 2021-07-01 DIAGNOSIS — N179 Acute kidney failure, unspecified: Secondary | ICD-10-CM | POA: Diagnosis present

## 2021-07-01 DIAGNOSIS — Z794 Long term (current) use of insulin: Secondary | ICD-10-CM | POA: Diagnosis not present

## 2021-07-01 DIAGNOSIS — Z89512 Acquired absence of left leg below knee: Secondary | ICD-10-CM

## 2021-07-01 DIAGNOSIS — Z91018 Allergy to other foods: Secondary | ICD-10-CM | POA: Diagnosis not present

## 2021-07-01 DIAGNOSIS — G546 Phantom limb syndrome with pain: Secondary | ICD-10-CM | POA: Diagnosis not present

## 2021-07-01 DIAGNOSIS — N182 Chronic kidney disease, stage 2 (mild): Secondary | ICD-10-CM | POA: Diagnosis present

## 2021-07-01 DIAGNOSIS — Z87891 Personal history of nicotine dependence: Secondary | ICD-10-CM

## 2021-07-01 DIAGNOSIS — E1142 Type 2 diabetes mellitus with diabetic polyneuropathy: Secondary | ICD-10-CM | POA: Diagnosis present

## 2021-07-01 DIAGNOSIS — Z882 Allergy status to sulfonamides status: Secondary | ICD-10-CM | POA: Diagnosis not present

## 2021-07-01 DIAGNOSIS — I5032 Chronic diastolic (congestive) heart failure: Secondary | ICD-10-CM | POA: Diagnosis present

## 2021-07-01 DIAGNOSIS — I10 Essential (primary) hypertension: Secondary | ICD-10-CM | POA: Diagnosis not present

## 2021-07-01 DIAGNOSIS — Z8249 Family history of ischemic heart disease and other diseases of the circulatory system: Secondary | ICD-10-CM | POA: Diagnosis not present

## 2021-07-01 DIAGNOSIS — Z833 Family history of diabetes mellitus: Secondary | ICD-10-CM

## 2021-07-01 DIAGNOSIS — E669 Obesity, unspecified: Secondary | ICD-10-CM | POA: Diagnosis present

## 2021-07-01 DIAGNOSIS — I130 Hypertensive heart and chronic kidney disease with heart failure and stage 1 through stage 4 chronic kidney disease, or unspecified chronic kidney disease: Secondary | ICD-10-CM | POA: Diagnosis present

## 2021-07-01 DIAGNOSIS — Z823 Family history of stroke: Secondary | ICD-10-CM

## 2021-07-01 DIAGNOSIS — I739 Peripheral vascular disease, unspecified: Secondary | ICD-10-CM | POA: Diagnosis not present

## 2021-07-01 DIAGNOSIS — I442 Atrioventricular block, complete: Secondary | ICD-10-CM | POA: Diagnosis not present

## 2021-07-01 DIAGNOSIS — Z20822 Contact with and (suspected) exposure to covid-19: Secondary | ICD-10-CM | POA: Diagnosis present

## 2021-07-01 DIAGNOSIS — M869 Osteomyelitis, unspecified: Secondary | ICD-10-CM | POA: Diagnosis present

## 2021-07-01 DIAGNOSIS — E1169 Type 2 diabetes mellitus with other specified complication: Secondary | ICD-10-CM | POA: Diagnosis present

## 2021-07-01 DIAGNOSIS — D631 Anemia in chronic kidney disease: Secondary | ICD-10-CM | POA: Diagnosis present

## 2021-07-01 DIAGNOSIS — I96 Gangrene, not elsewhere classified: Secondary | ICD-10-CM | POA: Diagnosis not present

## 2021-07-01 LAB — CULTURE, BLOOD (ROUTINE X 2)
Culture: NO GROWTH
Culture: NO GROWTH
Special Requests: ADEQUATE

## 2021-07-01 LAB — GLUCOSE, CAPILLARY
Glucose-Capillary: 86 mg/dL (ref 70–99)
Glucose-Capillary: 128 mg/dL — ABNORMAL HIGH (ref 70–99)
Glucose-Capillary: 152 mg/dL — ABNORMAL HIGH (ref 70–99)
Glucose-Capillary: 152 mg/dL — ABNORMAL HIGH (ref 70–99)

## 2021-07-01 MED ORDER — ROSUVASTATIN CALCIUM 5 MG PO TABS
10.0000 mg | ORAL_TABLET | Freq: Every day | ORAL | Status: DC
Start: 2021-07-02 — End: 2021-07-15
  Administered 2021-07-02 – 2021-07-15 (×14): 10 mg via ORAL
  Filled 2021-07-01 (×15): qty 2

## 2021-07-01 MED ORDER — ALUM & MAG HYDROXIDE-SIMETH 200-200-20 MG/5ML PO SUSP
15.0000 mL | ORAL | Status: DC | PRN
  Administered 2021-07-03: 30 mL via ORAL
  Filled 2021-07-01: qty 30

## 2021-07-01 MED ORDER — FAMOTIDINE 20 MG PO TABS: 20.0000 mg | ORAL_TABLET | Freq: Every day | ORAL | Status: DC

## 2021-07-01 MED ORDER — INSULIN ASPART 100 UNIT/ML IJ SOLN
0.0000 [IU] | Freq: Three times a day (TID) | INTRAMUSCULAR | Status: DC
Start: 2021-07-01 — End: 2021-07-15
  Administered 2021-07-01: 4 [IU] via SUBCUTANEOUS
  Administered 2021-07-02 (×2): 3 [IU] via SUBCUTANEOUS
  Administered 2021-07-03: 4 [IU] via SUBCUTANEOUS
  Administered 2021-07-03: 3 [IU] via SUBCUTANEOUS
  Administered 2021-07-03: 7 [IU] via SUBCUTANEOUS
  Administered 2021-07-05: 3 [IU] via SUBCUTANEOUS
  Administered 2021-07-05: 7 [IU] via SUBCUTANEOUS
  Administered 2021-07-06: 3 [IU] via SUBCUTANEOUS
  Administered 2021-07-06 (×2): 4 [IU] via SUBCUTANEOUS
  Administered 2021-07-07: 7 [IU] via SUBCUTANEOUS
  Administered 2021-07-07 – 2021-07-08 (×3): 4 [IU] via SUBCUTANEOUS
  Administered 2021-07-08 (×2): 7 [IU] via SUBCUTANEOUS
  Administered 2021-07-09 (×2): 3 [IU] via SUBCUTANEOUS
  Administered 2021-07-09: 4 [IU] via SUBCUTANEOUS
  Administered 2021-07-10: 3 [IU] via SUBCUTANEOUS
  Administered 2021-07-10 (×2): 11 [IU] via SUBCUTANEOUS
  Administered 2021-07-11 (×2): 4 [IU] via SUBCUTANEOUS
  Administered 2021-07-11: 3 [IU] via SUBCUTANEOUS
  Administered 2021-07-12: 4 [IU] via SUBCUTANEOUS
  Administered 2021-07-12 – 2021-07-13 (×3): 3 [IU] via SUBCUTANEOUS
  Administered 2021-07-13 – 2021-07-15 (×6): 4 [IU] via SUBCUTANEOUS

## 2021-07-01 MED ORDER — CHLORTHALIDONE 25 MG PO TABS
25.0000 mg | ORAL_TABLET | Freq: Every day | ORAL | Status: DC
  Administered 2021-07-02 – 2021-07-15 (×14): 25 mg via ORAL
  Filled 2021-07-01 (×14): qty 1

## 2021-07-01 MED ORDER — ASCORBIC ACID 1000 MG PO TABS: 1000.0000 mg | ORAL_TABLET | Freq: Every day | ORAL | Status: DC

## 2021-07-01 MED ORDER — DOCUSATE SODIUM 100 MG PO CAPS
100.0000 mg | ORAL_CAPSULE | Freq: Every day | ORAL | 0 refills | Status: DC
  Filled 2021-07-01: qty 10, 10d supply, fill #0

## 2021-07-01 MED ORDER — ZINC SULFATE 220 (50 ZN) MG PO CAPS: 220.0000 mg | ORAL_CAPSULE | Freq: Every day | ORAL | Status: DC

## 2021-07-01 MED ORDER — ENOXAPARIN SODIUM 40 MG/0.4ML IJ SOSY
40.0000 mg | PREFILLED_SYRINGE | INTRAMUSCULAR | Status: DC
  Administered 2021-07-01 – 2021-07-02 (×2): 40 mg via SUBCUTANEOUS
  Filled 2021-07-01 (×2): qty 0.4

## 2021-07-01 MED ORDER — ENSURE MAX PROTEIN PO LIQD: 11.0000 [oz_av] | Freq: Every day | ORAL | Status: DC

## 2021-07-01 MED ORDER — PANTOPRAZOLE SODIUM 40 MG PO TBEC
40.0000 mg | DELAYED_RELEASE_TABLET | Freq: Every day | ORAL | Status: DC
Start: 2021-07-02 — End: 2021-07-15
  Administered 2021-07-02 – 2021-07-15 (×14): 40 mg via ORAL
  Filled 2021-07-01 (×16): qty 1

## 2021-07-01 MED ORDER — INSULIN ASPART 100 UNIT/ML IJ SOLN
0.0000 [IU] | Freq: Three times a day (TID) | INTRAMUSCULAR | 11 refills | Status: DC
  Filled 2021-07-01: qty 10, 17d supply, fill #0

## 2021-07-01 MED ORDER — POLYETHYLENE GLYCOL 3350 17 GM/SCOOP PO POWD
17.0000 g | Freq: Every day | ORAL | 0 refills | Status: DC | PRN
  Filled 2021-07-01: qty 238, 14d supply, fill #0

## 2021-07-01 MED ORDER — DOCUSATE SODIUM 100 MG PO CAPS
100.0000 mg | ORAL_CAPSULE | Freq: Every day | ORAL | Status: DC
  Administered 2021-07-02 – 2021-07-15 (×12): 100 mg via ORAL
  Filled 2021-07-01 (×12): qty 1

## 2021-07-01 MED ORDER — OXYCODONE HCL 5 MG PO TABS
5.0000 mg | ORAL_TABLET | ORAL | Status: DC | PRN
  Administered 2021-07-01 – 2021-07-03 (×3): 10 mg via ORAL
  Filled 2021-07-01 (×3): qty 2

## 2021-07-01 MED ORDER — INSULIN GLARGINE-YFGN 100 UNIT/ML ~~LOC~~ SOLN
40.0000 [IU] | Freq: Every day | SUBCUTANEOUS | Status: DC
  Administered 2021-07-02 – 2021-07-03 (×2): 40 [IU] via SUBCUTANEOUS
  Filled 2021-07-01 (×2): qty 0.4

## 2021-07-01 MED ORDER — POLYETHYLENE GLYCOL 3350 17 G PO PACK
17.0000 g | PACK | Freq: Every day | ORAL | Status: DC | PRN
  Filled 2021-07-01: qty 1

## 2021-07-01 MED ORDER — PANCRELIPASE (LIP-PROT-AMYL) 12000-38000 UNITS PO CPEP
24000.0000 [IU] | ORAL_CAPSULE | Freq: Three times a day (TID) | ORAL | Status: DC
  Administered 2021-07-01 – 2021-07-15 (×41): 24000 [IU] via ORAL
  Filled 2021-07-01 (×45): qty 2

## 2021-07-01 MED ORDER — JUVEN PO PACK
1.0000 | PACK | Freq: Two times a day (BID) | ORAL | Status: DC
  Administered 2021-07-08 – 2021-07-13 (×2): 1 via ORAL
  Filled 2021-07-01 (×11): qty 1

## 2021-07-01 MED ORDER — ENSURE MAX PROTEIN PO LIQD
11.0000 [oz_av] | Freq: Every day | ORAL | Status: DC
  Administered 2021-07-01 – 2021-07-14 (×12): 11 [oz_av] via ORAL
  Filled 2021-07-01 (×2): qty 330

## 2021-07-01 MED ORDER — JUVEN PO PACK: 1.0000 | PACK | Freq: Two times a day (BID) | ORAL | 0 refills | Status: DC

## 2021-07-01 MED ORDER — LURASIDONE HCL 40 MG PO TABS
40.0000 mg | ORAL_TABLET | Freq: Every day | ORAL | Status: DC
  Administered 2021-07-01 – 2021-07-14 (×14): 40 mg via ORAL
  Filled 2021-07-01 (×16): qty 1

## 2021-07-01 MED ORDER — BISACODYL 5 MG PO TBEC: 5.0000 mg | DELAYED_RELEASE_TABLET | Freq: Every day | ORAL | Status: DC | PRN

## 2021-07-01 MED ORDER — PANTOPRAZOLE SODIUM 40 MG PO TBEC: 40.0000 mg | DELAYED_RELEASE_TABLET | Freq: Every day | ORAL | Status: DC

## 2021-07-01 MED ORDER — ALUM & MAG HYDROXIDE-SIMETH 200-200-20 MG/5ML PO SUSP
15.0000 mL | ORAL | 0 refills | Status: DC | PRN
  Filled 2021-07-01: qty 355, 1d supply, fill #0

## 2021-07-01 MED ORDER — ACETAMINOPHEN ER 650 MG PO TBCR: 650.0000 mg | EXTENDED_RELEASE_TABLET | Freq: Three times a day (TID) | ORAL | Status: DC | PRN

## 2021-07-01 MED ORDER — MELATONIN 3 MG PO TABS
3.0000 mg | ORAL_TABLET | Freq: Every evening | ORAL | Status: DC | PRN
  Administered 2021-07-02 – 2021-07-13 (×8): 3 mg via ORAL
  Filled 2021-07-01 (×9): qty 1

## 2021-07-01 MED ORDER — PHENOL 1.4 % MT LIQD
1.0000 | OROMUCOSAL | 0 refills | Status: DC | PRN
Start: 2021-07-01 — End: 2021-07-15

## 2021-07-01 MED ORDER — BISACODYL 5 MG PO TBEC
5.0000 mg | DELAYED_RELEASE_TABLET | Freq: Every day | ORAL | 0 refills | Status: DC | PRN
  Filled 2021-07-01: qty 30, 30d supply, fill #0

## 2021-07-01 MED ORDER — DOXYCYCLINE HYCLATE 100 MG PO TABS
100.0000 mg | ORAL_TABLET | Freq: Two times a day (BID) | ORAL | Status: DC
  Administered 2021-07-01 – 2021-07-15 (×28): 100 mg via ORAL
  Filled 2021-07-01 (×29): qty 1

## 2021-07-01 MED ORDER — DOXYCYCLINE HYCLATE 100 MG PO TABS: 100.0000 mg | ORAL_TABLET | Freq: Two times a day (BID) | ORAL | Status: DC

## 2021-07-01 MED ORDER — ASCORBIC ACID 500 MG PO TABS
1000.0000 mg | ORAL_TABLET | Freq: Every day | ORAL | Status: DC
  Administered 2021-07-02 – 2021-07-15 (×14): 1000 mg via ORAL
  Filled 2021-07-01 (×15): qty 2

## 2021-07-01 MED ORDER — ENOXAPARIN SODIUM 40 MG/0.4ML IJ SOSY
40.0000 mg | PREFILLED_SYRINGE | INTRAMUSCULAR | Status: DC
Start: 2021-07-01 — End: 2021-07-01

## 2021-07-01 MED ORDER — ZINC OXIDE 40 % EX OINT
TOPICAL_OINTMENT | Freq: Two times a day (BID) | CUTANEOUS | 0 refills | Status: DC
  Filled 2021-07-01: qty 56.7, fill #0

## 2021-07-01 MED ORDER — MELATONIN 3 MG PO TABS: 3.0000 mg | ORAL_TABLET | Freq: Every evening | ORAL | 0 refills | Status: DC | PRN

## 2021-07-01 MED ORDER — ADULT MULTIVITAMIN W/MINERALS CH
1.0000 | ORAL_TABLET | Freq: Every day | ORAL | Status: DC
  Administered 2021-07-02 – 2021-07-15 (×14): 1 via ORAL
  Filled 2021-07-01 (×14): qty 1

## 2021-07-01 MED ORDER — ZINC SULFATE 220 (50 ZN) MG PO CAPS
220.0000 mg | ORAL_CAPSULE | Freq: Every day | ORAL | Status: AC
  Administered 2021-07-02 – 2021-07-10 (×9): 220 mg via ORAL
  Filled 2021-07-01 (×9): qty 1

## 2021-07-01 MED ORDER — ACETAMINOPHEN 325 MG PO TABS
325.0000 mg | ORAL_TABLET | ORAL | Status: DC | PRN
  Administered 2021-07-02 – 2021-07-10 (×5): 650 mg via ORAL
  Filled 2021-07-01 (×5): qty 2

## 2021-07-01 MED ORDER — FAMOTIDINE 20 MG PO TABS
20.0000 mg | ORAL_TABLET | Freq: Every day | ORAL | Status: DC
  Administered 2021-07-02 – 2021-07-15 (×14): 20 mg via ORAL
  Filled 2021-07-01 (×14): qty 1

## 2021-07-01 MED ORDER — ZINC OXIDE 40 % EX OINT
TOPICAL_OINTMENT | Freq: Two times a day (BID) | CUTANEOUS | Status: DC
  Administered 2021-07-04 – 2021-07-12 (×5): 1 via TOPICAL
  Filled 2021-07-01 (×3): qty 57

## 2021-07-01 NOTE — Progress Notes (Signed)
Inpatient Rehab Admissions Coordinator:   I have a bed for this pt. On CIR. RN may call report to [phone] after 12pm   Clemens Catholic, [place], [place] Admissions Coordinator  [phone] (celll) [phone] (office)

## 2021-07-01 NOTE — H&P (Signed)
Physical Medicine and Rehabilitation Admission H&P        Chief Complaint  Patient presents with   Hyperglycemia  : HPI: Sarah Long. Tolen is a 50 year old right-handed female with history of uncontrolled diabetes mellitus, bipolar disorder, CKD stage III with creatinine 1.93 06/18/2021, peripheral vascular disease, diastolic congestive heart failure, pancreatitis, obesity with BMI 34.33, tobacco/alcohol use.  Per chart review patient is currently living in a hotel with her 58 and 53 year old child.  She does have a 59 year old daughter in the area who is also taking care of the patient's 45 year old son.  Presented 06/25/2020 with gangrenous abscess and ascending necrotizing fasciitis of the left foot..  Wound was not felt to be salvageable and underwent left BKA 06/27/2021 per Dr. Sharol Given.  Wound VAC as directed.  Placed on Lovenox for DVT prophylaxis.  Acute on chronic anemia 8.2 and monitored.  Leukocytosis 20,000 improved from 33,500.  Monitoring of renal function AKI /CKD stage III admission creatinine 3.12 improved to 1.07.  Therapy evaluations completed due to patient decreased functional mobility was admitted for a comprehensive rehab program.   Review of Systems Constitutional:  Negative for chills and fever. HENT:  Negative for hearing loss.   Eyes:  Negative for blurred vision and double vision. Respiratory:  Negative for cough and shortness of breath.   Cardiovascular:  Positive for leg swelling. Negative for chest pain and palpitations. Gastrointestinal:  Positive for constipation. Negative for heartburn, nausea and vomiting. Genitourinary:  Negative for dysuria, flank pain and hematuria. Musculoskeletal:  Positive for joint pain and myalgias. Skin:  Negative for rash. Neurological:  Positive for dizziness. Psychiatric/Behavioral:  Positive for depression.        Bipolar disorder  All other systems reviewed and are negative.     Past Medical History:  Diagnosis Date   Abnormal Pap smear  1998   Abnormal vaginal bleeding 12/19/2011   Acute urinary retention 06/26/2021   Bacterial infection     Bipolar 1 disorder (HCC)     Blister of second toe of left foot 11/21/2016   Candida vaginitis 07/2007   Depression      recently added wellbutrin-has not taken yet for bipolar   Diabetes in pregnancy     Diabetes mellitus      nph 20U qam and qpm, regular with meals   Diabetic ketoacidosis without coma associated with type 2 diabetes mellitus ([place])     Fibroid     Galactorrhea of right breast 2008   H/O amenorrhea 07/2007   H/O dizziness 10/14/2011   H/O dysmenorrhea 2010   H/O menorrhagia 10/14/2011   H/O varicella     Headache(784.0)     Heavy vaginal bleeding due to contraceptive injection use 10/12/2011    Depo Provera   Herpes     HSV-2 infection 01/03/2009   Hx: UTI (urinary tract infection) 2009   Hypertension      on aldomet   Increased BMI 2010   Irregular uterine bleeding 04/04/2012    Pt has mirena   Obesity 10/14/2011   Oligomenorrhea 07/2007   Pelvic pain in female     Presence of permanent cardiac pacemaker     Preterm labor     Trichomonas     Yeast infection           Past Surgical History:  Procedure Laterality Date   ABDOMINAL AORTOGRAM W/LOWER EXTREMITY N/A 06/17/2021    Procedure: ABDOMINAL AORTOGRAM W/LOWER EXTREMITY;  Surgeon: Wellington Hampshire, MD;  Location: [facility];  Service: Cardiovascular;  Laterality: N/A;   AMPUTATION Left 06/27/2021    Procedure: AMPUTATION BELOW KNEE;  Surgeon: Newt Minion, MD;  Location: [facility];  Service: Orthopedics;  Laterality: Left;   CESAREAN SECTION   1991   LEFT HEART CATH AND CORONARY ANGIOGRAPHY N/A 02/10/2020    Procedure: LEFT HEART CATH AND CORONARY ANGIOGRAPHY;  Surgeon: Troy Sine, MD;  Location: [facility];  Service: Cardiovascular;  Laterality: N/A;   PACEMAKER IMPLANT N/A 02/13/2020    Procedure: PACEMAKER IMPLANT;  Surgeon: Constance Haw, MD;  Location: [facility];  Service: Cardiovascular;  Laterality: N/A;   TEMPORARY PACEMAKER N/A 02/10/2020    Procedure: TEMPORARY PACEMAKER;  Surgeon: Troy Sine, MD;  Location: [facility];  Service: Cardiovascular;  Laterality: N/A;         Family History  Problem Relation Age of Onset   Hypertension Mother     Diabetes Mother     Heart disease Mother     Hypertension Father     Diabetes Father     Heart disease Father     Stroke Maternal Grandfather     Other Neg Hx      Social History:  reports that she has quit smoking. Her smoking use included cigarettes. She has a 10.00 pack-year smoking history. She has never used smokeless tobacco. She reports that she does not drink alcohol and does not use drugs. Allergies:      Allergies  Allergen Reactions   Strawberry Extract Hives   Sulfa Antibiotics Hives and Itching          Medications Prior to Admission  Medication Sig Dispense Refill   Accu-Chek Softclix Lancets lancets Use as instructed to check blood sugar three times daily. E11.65 100 each 6   acetaminophen (TYLENOL) 650 MG CR tablet Take 1,950 mg by mouth daily as needed for pain.       Blood Glucose Monitoring Suppl (ACCU-CHEK GUIDE) w/Device KIT Use as directed daily 1 kit 0   chlorthalidone (HYGROTON) 25 MG tablet Take 1 tablet (25 mg total) by mouth daily. 90 tablet 1   gabapentin (NEURONTIN) 300 MG capsule Take 2 capsules (600 mg total) by mouth 3 (three) times daily. 180 capsule 3   glucose blood (ACCU-CHEK GUIDE) test strip Use as instructed to check blood sugar three times daily. E11.65 100 each 6   insulin aspart (NOVOLOG FLEXPEN) 100 UNIT/ML FlexPen Inject 10-20 Units into the skin 3 (three) times daily with meals. 10 unit with meals Sliding scale three times daily: 0-150: 0 151-200: 2 units  201-250: 4 units  251-300: 6 units  301-350: 8 units  351- 400 10 units   >400  12 units 6 mL 1   insulin glargine (LANTUS SOLOSTAR) 100 UNIT/ML Solostar Pen Inject 40 Units into the  skin at bedtime. 12 mL 1   Insulin Pen Needle (B-D ULTRAFINE III SHORT PEN) 31G X 8 MM MISC use as directed to inject insulin 90 each 3   Lancet Devices (ADJUSTABLE LANCING DEVICE) MISC Inject 1 each into the skin 3 (three) times daily. 100 each 5   lurasidone (LATUDA) 40 MG TABS tablet Take 1 tablet by mouth every evening with meals to start on or after 04/30/21 (Patient taking differently: Take 40 mg by mouth every evening.) 30 tablet 1   Multiple Vitamins-Minerals (ADULT ONE DAILY GUMMIES PO) Take 1 capsule by mouth daily.       mupirocin  ointment (BACTROBAN) 2 % APPLY A THIN film OF OINTMENT to the open wounds on your toes every other day x 30 days or until healed (Patient taking differently: Apply 1 application topically every other day.) 22 g 2   Pancrelipase, Lip-Prot-Amyl, (ZENPEP) 20000-63000 units CPEP Take 1 capsule by mouth 3 (three) times daily before meals. 180 capsule 4   rosuvastatin (CRESTOR) 10 MG tablet Take 1 tablet (10 mg total) by mouth daily. 90 tablet 1      Drug Regimen Review Drug regimen was reviewed and remains appropriate with no significant issues identified   Home: Home Living Family/patient expects to be discharged to:: Private residence Living Arrangements: Children Available Help at Discharge: Family, Available PRN/intermittently Type of Home: Other(Comment) (extended stay hotel) Home Access: Level entry Home Layout: One level Bathroom Shower/Tub: Government social research officer Accessibility: Yes Home Equipment: None Additional Comments: pt reported living in a hotel with her children 61 and 3 yo (currently with their dad); 22 yo staying with her 24 yo child   Functional History: Prior Function Level of Independence: Independent   Functional Status:  Mobility: Bed Mobility Overal bed mobility: Needs Assistance Bed Mobility: Sit to Supine Rolling: Modified independent (Device/Increase time) Sidelying to sit: Min guard Supine to  sit: Supervision Sit to supine: Min guard General bed mobility comments: Supervision A for line management/safety. Transfers Overall transfer level: Needs assistance Equipment used: Rolling walker (2 wheeled) Transfers: Stand Pivot Transfers, Sit to/from Stand Sit to Stand: Min assist Stand pivot transfers: Min assist, +2 safety/equipment Squat pivot transfers: Min assist, +2 safety/equipment General transfer comment: Min A for sit to stand from EOB and from low recliner with cues for hand placement. Ambulation/Gait Ambulation/Gait assistance: Min assist, +2 safety/equipment Gait Distance (Feet): 5 Feet Assistive device: Rolling walker (2 wheeled) Gait Pattern/deviations: Step-to pattern General Gait Details: pt required cues for incr use of UEs to "cushion" her landing.Chair brought to pt to sit down as too much equipment and not enough room to turn around   ADL: ADL Overall ADL's : Needs assistance/impaired Eating/Feeding: Independent, Sitting Grooming: Wash/dry hands, Cueing for safety, Cueing for sequencing, Sitting, Set up, Oral care Grooming Details (indicate cue type and reason): Seated at sink level Upper Body Bathing: Supervision/ safety, Cueing for safety, Cueing for sequencing, Sitting Lower Body Bathing: Cueing for safety, Cueing for sequencing, Moderate assistance, Sitting/lateral leans Upper Body Dressing : Supervision/safety, Sitting, Cueing for safety, Cueing for sequencing Lower Body Dressing: Moderate assistance, Cueing for safety, Cueing for sequencing, Bed level, Sitting/lateral leans Toilet Transfer: Minimal assistance, RW Toilet Transfer Details (indicate cue type and reason): Simulated with transfer to recliner with Min A and cues for hand placement prior to sitting. Toileting- Clothing Manipulation and Hygiene: Total assistance, Cueing for safety, Cueing for sequencing, Sit to/from stand Functional mobility during ADLs: Minimal assistance, +2 for physical  assistance, +2 for safety/equipment, Rolling walker General ADL Comments: Patient limited by N/V and pain in L residual limb.   Cognition: Cognition Overall Cognitive Status: Within Functional Limits for tasks assessed Orientation Level: Oriented X4 Cognition Arousal/Alertness: Awake/alert, Lethargic Behavior During Therapy: WFL for tasks assessed/performed Overall Cognitive Status: Within Functional Limits for tasks assessed Area of Impairment: Attention Orientation Level:  (NT) Current Attention Level: Selective Memory: Decreased recall of precautions Following Commands: Follows one step commands with increased time, Follows one step commands consistently Safety/Judgement: Decreased awareness of deficits Awareness: Intellectual Problem Solving: Slow processing General Comments: Patient awake/alert upon entry. After demo, patient able to  take several hops with RW to sink surface for grooming tasks in sitting.   Physical Exam: Blood pressure (!) 120/52, pulse 62, temperature 98.5 F (36.9 C), temperature source Oral, resp. rate 14, height $RemoveBe'5\' 4"'bZpoXNxEk$  (1.626 m), weight 90.7 kg, SpO2 98 %. Physical Exam Skin:    Comments: Left BKA with wound VAC in place.  Appropriately tender  Neurological:    Comments: Patient is alert.  No acute distress.  Oriented x3 and follows commands.   HEENT- right jugular central line General: No acute distress Mood and affect are appropriate Heart: Regular rate and rhythm no rubs murmurs or extra sounds Lungs: Clear to auscultation, breathing unlabored, no rales or wheezes Abdomen: Positive bowel sounds, soft nontender to palpation, nondistended Extremities: No clubbing, cyanosis, or edema Skin: No evidence of breakdown, no evidence of rash, RIght foot , dry skin , mild stasis dermatitis, RIght great toe with plantar callus Neurologic: Cranial nerves II through XII intact, motor strength is 5/5 in bilateral deltoid, bicep, tricep, grip, hip flexor, knee  extensors, ankle dorsiflexor and plantar flexor Sensory exam normal sensation to light touch and proprioception in bilateral upper and lower extremities  Musculoskeletal: No pain or limitation with BUE ROM, reduce ROM R ankle, Left knee limited by limb guard and wound vac No knee joint swelling on RIght    Lab Results Last 48 Hours        Results for orders placed or performed during the hospital encounter of 06/25/21 (from the past 48 hour(s))  Glucose, capillary     Status: Abnormal    Collection Time: 06/29/21 12:04 PM  Result Value Ref Range    Glucose-Capillary 278 (H) 70 - 99 mg/dL      Comment: Glucose reference range applies only to samples taken after fasting for at least 8 hours.  Glucose, capillary     Status: Abnormal    Collection Time: 06/29/21  4:00 PM  Result Value Ref Range    Glucose-Capillary 211 (H) 70 - 99 mg/dL      Comment: Glucose reference range applies only to samples taken after fasting for at least 8 hours.  Glucose, capillary     Status: Abnormal    Collection Time: 06/29/21  8:58 PM  Result Value Ref Range    Glucose-Capillary 143 (H) 70 - 99 mg/dL      Comment: Glucose reference range applies only to samples taken after fasting for at least 8 hours.  Protime-INR     Status: Abnormal    Collection Time: 06/30/21 12:54 AM  Result Value Ref Range    Prothrombin Time 15.7 (H) 11.4 - 15.2 seconds    INR 1.3 (H) 0.8 - 1.2      Comment: (NOTE) INR goal varies based on device and disease states. Performed at [facility], [place] [address] [place], [place] [address]    CBC     Status: Abnormal    Collection Time: 06/30/21 12:54 AM  Result Value Ref Range    WBC 20.0 (H) 4.0 - 10.5 K/uL    RBC 3.02 (L) 3.87 - 5.11 MIL/uL    Hemoglobin 8.2 (L) 12.0 - 15.0 g/dL    HCT 26.5 (L) 36.0 - 46.0 %    MCV 87.7 80.0 - 100.0 fL    MCH 27.2 26.0 - 34.0 pg    MCHC 30.9 30.0 - 36.0 g/dL    RDW 16.9 (H) 11.5 - 15.5 %    Platelets 308 150 - 400 K/uL  nRBC 0.0  0.0 - 0.2 %      Comment: Performed at [facility], [place] [address], [place], [place] [address]  Comprehensive metabolic panel     Status: Abnormal    Collection Time: 06/30/21 12:54 AM  Result Value Ref Range    Sodium 137 135 - 145 mmol/L    Potassium 3.6 3.5 - 5.1 mmol/L    Chloride 108 98 - 111 mmol/L    CO2 25 22 - 32 mmol/L    Glucose, Bld 158 (H) 70 - 99 mg/dL      Comment: Glucose reference range applies only to samples taken after fasting for at least 8 hours.    BUN 30 (H) 6 - 20 mg/dL    Creatinine, Ser 1.07 (H) 0.44 - 1.00 mg/dL    Calcium 7.8 (L) 8.9 - 10.3 mg/dL    Total Protein 6.6 6.5 - 8.1 g/dL    Albumin 1.4 (L) 3.5 - 5.0 g/dL    AST 19 15 - 41 U/L    ALT 11 0 - 44 U/L    Alkaline Phosphatase 71 38 - 126 U/L    Total Bilirubin 0.3 0.3 - 1.2 mg/dL    GFR, Estimated >60 >60 mL/min      Comment: (NOTE) Calculated using the CKD-EPI Creatinine Equation (2021)      Anion gap 4 (L) 5 - 15      Comment: Performed at [facility], [place] [address] [place], [place] [address]  Glucose, capillary     Status: Abnormal    Collection Time: 06/30/21  7:40 AM  Result Value Ref Range    Glucose-Capillary 151 (H) 70 - 99 mg/dL      Comment: Glucose reference range applies only to samples taken after fasting for at least 8 hours.  Glucose, capillary     Status: Abnormal    Collection Time: 06/30/21 11:41 AM  Result Value Ref Range    Glucose-Capillary 180 (H) 70 - 99 mg/dL      Comment: Glucose reference range applies only to samples taken after fasting for at least 8 hours.  Glucose, capillary     Status: Abnormal    Collection Time: 06/30/21  4:22 PM  Result Value Ref Range    Glucose-Capillary 215 (H) 70 - 99 mg/dL      Comment: Glucose reference range applies only to samples taken after fasting for at least 8 hours.  Glucose, capillary     Status: Abnormal    Collection Time: 06/30/21  8:00 PM  Result Value Ref Range    Glucose-Capillary 175 (H) 70 - 99 mg/dL       Comment: Glucose reference range applies only to samples taken after fasting for at least 8 hours.  Glucose, capillary     Status: None    Collection Time: 07/01/21  7:48 AM  Result Value Ref Range    Glucose-Capillary 86 70 - 99 mg/dL      Comment: Glucose reference range applies only to samples taken after fasting for at least 8 hours.      Imaging Results (Last 48 hours)  No results found.           Medical Problem List and Plan: 1.  Debility secondary to gangrenous left foot.  Status post left BKA 06/27/2021.  Wound VAC as directed             -patient may not shower             -  ELOS/Goals: 2.  Antithrombotics: -DVT/anticoagulation: Lovenox             -antiplatelet therapy: N/A 3. Pain Management: Oxycodone as needed 4. Mood: Melatonin nightly as needed.  Provide emotional support             -antipsychotic agents: Latuda 40 mg daily 5. Neuropsych: This patient is capable of making decisions on her own behalf. 6. Skin/Wound Care: Routine skin checks 7. Fluids/Electrolytes/Nutrition: Routine in and outs with follow-up chemistries 8.  Acute on chronic anemia.  Follow-up CBC 9.  Diabetes mellitus with peripheral neuropathy.  Lantus insulin 40 units daily.  Provide teaching 10.  Hyperlipidemia.  Crestor 11.  AKI on CKD.  Follow-up chemistries 12.  Obesity.  BMI 34.33.  Dietary follow-up 13.  Hypertension.  Hygroton 25 mg daily. 14.  Diastolic congestive heart failure.  Monitor for any signs of fluid overload 15.  History of pancreatitis.  Continue Creon 16.  History of alcohol tobacco abuse.  Counseling   Cathlyn Parsons, PA-C 07/01/2021            "I have personally performed a face to face diagnostic evaluation of this patient.  Additionally, I have reviewed and concur with the physician assistant's documentation above." Charlett Blake M.D. [facility] Fellow Am Acad of Phys Med and Rehab Diplomate Am Board of Electrodiagnostic  Med Fellow Am Board of Interventional Pain

## 2021-07-01 NOTE — Discharge Instructions (Addendum)
Inpatient Rehab Discharge Instructions  Sarah Long Discharge date and time: 07/15/21   Activities/Precautions/ Functional Status: Activity: activity as tolerated Diet: diabetic diet Wound Care: Routine skin checks/clean stump with antibacterial soap/Dial and water daily. PAT DRY AND WEAR STUMP SHRINKER   Functional status:  ___ No restrictions     ___ Walk up steps independently _x__ 24/7 supervision/assistance   ___ Walk up steps with assistance ___ Intermittent supervision/assistance  ___ Bathe/dress independently ___ Walk with walker     __x_ Bathe/dress with assistance ___ Walk Independently    ___ Shower independently ___ Walk with assistance    ___ Shower with assistance __x_ No alcohol     ___ Return to work/school ________  Special Instructions:  No driving smoking or alcohol  Plan is to continue doxycycline for 30 days total with follow-up to Dr. Jess Barters office for revascularization surgery to right toe  Follow-up with Dr. Sharol Given for removal of staples    COMMUNITY REFERRALS UPON DISCHARGE:     HOME EXERCISE PROGRAM WILL GET OPPT ONCE READY FOR PROTHETIC   Medical Equipment/Items Ordered:WHEELCHAIR- GOT ROLLING [place]. HAS TUB SEAT AND 3 IN 1 FROM PAST ADMIT 2021                                                 Agency/Supplier:[facility] [employer]   [phone]  TRANSP[employer]ATION[employer] [phone]   My questions have been answered and I understand these instructions. I will adhere to these goals and the provided educational materials after my discharge from the hospital.  Patient/Caregiver Signature _______________________________ Date __________  Clinician Signature _______________________________________ Date __________  Please bring this form and your medication list with you to all your follow-up doctor's appointments.

## 2021-07-01 NOTE — Progress Notes (Signed)
Inpatient Rehabilitation Medication Review by a Pharmacist  A complete drug regimen review was completed for this patient to identify any potential clinically significant medication issues.  Clinically significant medication issues were identified:  yes   Type of Medication Issue Identified Description of Issue Urgent (address now) Non-Urgent (address on AM team rounds) Plan   Drug Interaction(s) (clinically significant)       Duplicate Therapy       Allergy       No Medication Administration End Date       Incorrect Dose       Additional Drug Therapy Needed  Gabapentin was not resumed per DC summary Non- urgent Consider restarting PTA gabapentin    Other  Oxycodone resumed per Rehab order set but Family Medicine team intended to DC it due to low use (used one time since amputation) Non- urgent Consider stopping oxycodone given low use post-amputation       For non-urgent medication issues to be resolved on team rounds tomorrow morning a [facility] Secure Chat Handoff was sent to:  Linna Hoff Angiulli      Time spent performing this drug regimen review (minutes):  15 min    Benetta Spar, PharmD, BCPS, [facility] Clinical Pharmacist  Please check AMION for all [place] phone numbers After 10:00 PM, call [place]

## 2021-07-01 NOTE — Discharge Summary (Addendum)
[facility] Discharge Summary  Patient name: Sarah Long Medical record number: [id number] Date of birth: Oct 02, 1971 Age: 50 y.o. Gender: female Date of Admission: 06/25/2021  Date of Discharge: 07/01/21 Admitting Physician: Blane Ohara McDiarmid, MD  Primary Care Provider: [name], MD Consultants: Orthopedic Surgery  Indication for Hospitalization: DKA, Left foot necrotizing Fasciitis and right foot osteomyelitis  Discharge Diagnoses/Problem List:  DKA  AKI in setting of DKA and dehydration Left foot necrotizing fasciitis Right great toe osteomyelitis  Disposition: CIR   Discharge Condition: Stable  Discharge Exam:  Blood pressure (!) 120/52, pulse 62, temperature 98.5 F (36.9 C), temperature source Oral, resp. rate 14, height _0  (1.626 m), weight 90.7 kg, SpO2 98 %.  General: Alert, Pleasant  Cardiovascular: RRR, No murmurs Respiratory: Clear breath sounds bilaterally, no wheezing Abdomen:No distension or tenderness Extremities:Left leg amputated and gangrenous right great toe with no discharge Neuro:Pleasant affect, oriented x4  Brief Hospital Course:  [name] is a 50 yo female who presented to the ED with worsening fatigue, polydipsia and nausea in the setting of DKA. She also presented with left foot necrotizing fasciitis and right great toe osteomyelitis.   DKA in the setting of uncontrolled Type 2 Diabetes  Patient's CBG on arrival in the ED was 848 with lactic acid of 3.4 , Anion gap of 22, ph of 7.45, bicarb 15 and CO2 22.6. She was started on endotool and IV LR. Her CBG remained high at over 600 despite this intervention. After switch to normal saline and increased rate of 250 mL/hr, anion gap closed. She was then transitioned to 26U of lantus and sensitive sliding scale insulin. Patient's lantus and sliding scale insulin were adjusted daily for optimum control of the blood glucose which was checked every 4 hours.  Discharged on home Lantus 40 units, mealtime 3 times daily insulin sliding scale.  PCP to adjust as necessary.  AKI in the setting of DKA Patient's creatinine on admission was 3.80 which is attributed to her DKA. She was treated with large volume IV fluid.  AKI resolved, on discharge creatinine was 1.07.   Necrotizing Fasciitis of the Left foot, now s/p left transtibial amputation On admission patient was found to have gangrene of all right 5 toes with edematous foot and raised skin tissue which was concerning for necrotizing Fasciitis. Necrotizing Fasciitis was confirmed with a Leg CT and patient was started on broad antibiotics of Vancomycin, IV clindamycin 600 mg and Cefepime 2 g. The orthopedic team was consulted who performed a transtibial amputation of the left foot.  Pain well controlled with IV dilaudid 84m prn, scheduled Tylenol 500 mg and PRN oxycodone 15 mg.  IV antibiotics discontinued 24 hours postop per Ortho recs.  PT/OT evaluated recommended patient sent to CIR for inpatient rehab.  Osteomyelitis of the right great toe During admission patient was found to have a non healing gangrenous ulcer of the right great toe which prompted a CT of the right foot that revealed osteomyelitis of the right great toe. Ortho was consulted, declined surgical management of osteo due to concurrent need for left transtibial amputation for nec fasc. Ortho instead recommended starting patient on doxycycline 100 mg BID for 30 days with plan for reevaluation of the right toe in 4 weeks. During this hospitalization, patient also received vancomycin, clindamycin, and [facility]    Discharge recommendations:  F/U outpatient Cardiology consult: last ECHO 2021 showed speckled myocardium favoring  amyloid.  Seen cards in 2021, started Entresto BID.   Reduced home sliding scale at time of discharge, had question if she was actually using this. Recommend consider if need adjustment of  sliding scale insulin  Continue Antibiotics of doxycycline 100 mg for a month until follow up with ortho Dr. Sharol Long Follow up with orthopedic appointment    Significant Procedures: Left foot transtibial amputation 06/27/21  Significant Labs and Imaging:  Recent Labs  Lab 06/29/21 0500 06/30/21 0054 07/02/21 0546  WBC 22.5* 20.0* 12.5*  HGB 8.2* 8.2* 7.6*  HCT 26.2* 26.5* 25.6*  PLT 300 308 300   Recent Labs  Lab 06/26/21 0625 06/26/21 1103 06/27/21 0123 06/27/21 1751 06/28/21 0500 06/29/21 0500 06/30/21 0054 07/02/21 0546  NA 133*   < > 133*  --  130* 135 137 135  K 3.4*   < > 3.1*  --  3.7 3.8 3.6 3.5  CL 103   < > 104  --  101 105 108 105  CO2 17*   < > 19*  --  21* _0 GLUCOSE 224*   < > 264*  --  455* 280* 158* 134*  BUN 77*   < > 64*  --  53* 41* 30* 21*  CREATININE 2.70*   < > 1.97*  --  1.61* 1.38* 1.07* 1.14*  CALCIUM 7.7*   < > 7.7*  --  7.6* 7.8* 7.8* 7.6*  MG 2.0  --  1.7 1.8 1.7  --   --   --   PHOS  --   --  1.8* 4.5 3.1  --   --   --   ALKPHOS  --   --  106  --  170* 70 71 69  AST  --   --  19  --  _1 ALT  --   --  15  --  _2 ALBUMIN  --   --  1.3*  --  1.3* 1.3* 1.4* 1.5*   < > = values in this interval not displayed.   MRI OF THE RIGHT FOREFOOT WITHOUT CONTRAST (06/25/21) IMPRESSION: Edema signal with mild low T1 signal within the great toe distal phalanx and partially within the proximal phalanx, with possible adjacent soft tissue ulcer. Signal characteristics are compatible with either early osteomyelitis or reactive marrow signal change.   Bony edema throughout the middle cuneiform without distinct fracture or confluent low T1 signal to suggest infection. This is favored to represent reactive marrow signal change.  CT OF THE LEFT FOOT WITHOUT CONTRAST  (06/25/21) IMPRESSION: 1. Extensive soft tissue gas in the foot and visualized portion of the ankle. Necrotizing fasciitis is a distinct concern. 2. Gas is identified in  the marrow space of multiple bones of the midfoot consistent with associated osteomyelitis. 3. Extensive muscular and soft tissue edema without a discrete fluid collection/abscess evident although assessment is markedly limited by lack of intravenous contrast material. 4. MRI of the foot with and without contrast recommended for more definitive assessment.   CT OF THE RIGHT FOOT WITHOUT CONTRAST (06/25/21) IMPRESSION: 1. Subcutaneous edema, most pronounced in the midfoot and region of the MTP joints. No gas identified within the soft tissues. No gross discrete fluid collection. 2. No gross bony destruction to suggest overt osteomyelitis. Subtle cortical irregularity in the great toe, nonspecific. 3. If there is clinical concern for osteomyelitis, MRI of the foot with and without contrast recommended to further evaluate. MRI  is also more sensitive in the detection of soft tissue abscesses.  CT HEAD WITHOUT CONTRAST (06/25/21) IMPRESSION: No acute intracranial abnormality.   Results/Tests Pending at Time of Discharge: None  Discharge Medications:  Allergies as of 07/01/2021       Reactions   Strawberry Extract Hives   Sulfa Antibiotics Hives, Itching        Medication List     STOP taking these medications    mupirocin ointment 2 % Commonly known as: BACTROBAN   NovoLOG FlexPen 100 UNIT/ML FlexPen Generic drug: insulin aspart Replaced by: insulin aspart 100 UNIT/ML injection       TAKE these medications    Accu-Chek Guide test strip Generic drug: glucose blood Use as instructed to check blood sugar three times daily. E11.65   Accu-Chek Guide w/Device Kit Use as directed daily   Accu-Chek Softclix Lancets lancets Use as instructed to check blood sugar three times daily. E11.65   acetaminophen 650 MG CR tablet Commonly known as: TYLENOL Take 1 tablet (650 mg total) by mouth every 8 (eight) hours as needed for pain. What changed:  how much to take when to  take this   Adjustable Lancing Device Misc Inject 1 each into the skin 3 (three) times daily.   ADULT ONE DAILY GUMMIES PO Take 1 capsule by mouth daily.   alum & mag hydroxide-simeth 200-200-20 MG/5ML suspension Commonly known as: MAALOX/MYLANTA Take 15-30 mLs by mouth every 2 (two) hours as needed for indigestion.   ascorbic acid 1000 MG tablet Commonly known as: VITAMIN C Take 1 tablet (1,000 mg total) by mouth daily.   B-D ULTRAFINE III SHORT PEN 31G X 8 MM Misc Generic drug: Insulin Pen Needle use as directed to inject insulin   bisacodyl 5 MG EC tablet Commonly known as: DULCOLAX Take 1 tablet (5 mg total) by mouth daily as needed for moderate constipation.   chlorthalidone 25 MG tablet Commonly known as: HYGROTON Take 1 tablet (25 mg total) by mouth daily.   docusate sodium 100 MG capsule Commonly known as: COLACE Take 1 capsule (100 mg total) by mouth daily.   doxycycline 100 MG tablet Commonly known as: VIBRA-TABS Take 1 tablet (100 mg total) by mouth every 12 (twelve) hours.   famotidine 20 MG tablet Commonly known as: PEPCID Take 1 tablet (20 mg total) by mouth daily.   gabapentin 300 MG capsule Commonly known as: NEURONTIN Take 2 capsules (600 mg total) by mouth 3 (three) times daily.   insulin aspart 100 UNIT/ML injection Commonly known as: novoLOG Inject 0-20 Units into the skin 3 (three) times daily with meals. Replaces: NovoLOG FlexPen 100 UNIT/ML FlexPen   Lantus SoloStar 100 UNIT/ML Solostar Pen Generic drug: insulin glargine Inject 40 Units into the skin at bedtime.   Latuda 40 MG Tabs tablet Generic drug: lurasidone Take 1 tablet by mouth every evening with meals to start on or after 04/30/21 What changed:  how much to take how to take this when to take this   liver oil-zinc oxide 40 % ointment Commonly known as: DESITIN Apply topically 2 (two) times daily.   melatonin 3 MG Tabs tablet Take 1 tablet (3 mg total) by mouth at bedtime  as needed.   nutrition supplement (JUVEN) Pack Take 1 packet by mouth 2 (two) times daily between meals.   Ensure Max Protein Liqd Take 330 mLs (11 oz total) by mouth at bedtime.   pantoprazole 40 MG tablet Commonly known as: PROTONIX Take 1 tablet (40  mg total) by mouth daily.   phenol 1.4 % Liqd Commonly known as: CHLORASEPTIC Use as directed 1 spray in the mouth or throat as needed for throat irritation / pain.   polyethylene glycol powder 17 GM/SCOOP powder Commonly known as: GLYCOLAX/MIRALAX Take 1 capful by mouth daily as needed for mild constipation.   rosuvastatin 10 MG tablet Commonly known as: CRESTOR Take 1 tablet (10 mg total) by mouth daily.   Zenpep 20000-63000 units Cpep Generic drug: Pancrelipase (Lip-Prot-Amyl) Take 1 capsule by mouth 3 (three) times daily before meals.   zinc sulfate 220 (50 Zn) MG capsule Take 1 capsule (220 mg total) by mouth daily.        Discharge Instructions: Please refer to Patient Instructions section of EMR for full details.  Patient was counseled important signs and symptoms that should prompt return to medical care, changes in medications, dietary instructions, activity restrictions, and follow up appointments.   Follow-Up Appointments:  Follow-up Information     Newt Minion, MD Follow up in 1 week(s).   Specialty: Orthopedic Surgery Contact information: [place] [address] [phone]         [name], MD. Schedule an appointment as soon as possible for a visit in 1 week(s).   Specialty: Pulmonary Disease Why: Make a follow up appointment with your primary care provider . Contact information: [address] [address] [phone]         Sueanne Margarita, MD .   Specialty: Cardiology Contact information: [phone] [address] [place] [address] [phone]         Constance Haw, MD .   Specialty: Cardiology Contact information: [address] [place] [address] [phone]                 Alen Bleacher, MD 07/01/2021, 7:49 PM PGY-1, [facility]   Upper Level Addendum: I have reviewed the above note, making necessary revisions as appropriate. These are denoted by green text. I agree with the medical decision making and physical exam as noted above. Ezequiel Essex, MD PGY-2 [facility]

## 2021-07-01 NOTE — Progress Notes (Signed)
Occupational Therapy Treatment Patient Details Name: Sarah Long MRN: [id number] DOB: 01-Jun-1971 Today's Date: 07/01/2021    History of present illness Pt is a 50 y/o female admitted secondary to worsening fatigue, polydipsia and nausea in the setting of DKA. Pt admitted due to necrotizing fasciitis of L foot, uncontrolled DM2 and metabolic encephalopathy. 7/23 underwent L BKA. Noted osteo rt foot with foot ulcers   PMH but not limited to: T2DM, HTN, HLD, CHF, pancreatitis, hx of ETOh abuse, pacemaker implant (2021), bipolar and depression   OT comments  OT treatment session with focus on self-care re-education including ADL transfers, activity tolerance and short-distance functional mobility in prep for ADLs. Patient demonstrates sit to stand transfers x2 from various surfaces with Min A and cues for hand placement and functional mobility up to 12f x2 trials with Min A. Patient continues to be limited by decreased activity tolerance, pain in L residual limb, generalized weakness/debility and need for Min to Mod A grossly for ADLs. Continued recommendation for CIR.     Follow Up Recommendations  CIR    Equipment Recommendations  Other (comment) (Defer to next level of care.)    Recommendations for Other Services Rehab consult    Precautions / Restrictions Precautions Precautions: Fall Required Braces or Orthoses: Other Brace Other Brace: residual limb guard Restrictions Other Position/Activity Restrictions: no orders related to RIGHT foot       Mobility Bed Mobility Overal bed mobility: Needs Assistance Bed Mobility: Sit to Supine     Supine to sit: Supervision     General bed mobility comments: Supervision A for line management/safety.    Transfers Overall transfer level: Needs assistance Equipment used: Rolling walker (2 wheeled) Transfers: Stand Pivot Transfers;Sit to/from Stand Sit to Stand: Min assist Stand pivot transfers: Min assist;+2 safety/equipment Squat  pivot transfers: Min assist;+2 safety/equipment     General transfer comment: Min A for sit to stand from EOB and from low recliner with cues for hand placement.    Balance Overall balance assessment: Needs assistance Sitting-balance support: Feet supported Sitting balance-Leahy Scale: Good                                     ADL either performed or assessed with clinical judgement   ADL Overall ADL's : Needs assistance/impaired     Grooming: Wash/dry hands;Cueing for safety;Cueing for sequencing;Sitting;Set up;Oral care Grooming Details (indicate cue type and reason): Seated at sink level                 Toilet Transfer: Minimal assistance;RW Toilet Transfer Details (indicate cue type and reason): Simulated with transfer to recliner with Min A and cues for hand placement prior to sitting.                 Vision       Perception     Praxis      Cognition Arousal/Alertness: Awake/alert;Lethargic Behavior During Therapy: WFL for tasks assessed/performed Overall Cognitive Status: Within Functional Limits for tasks assessed Area of Impairment: Attention                   Current Attention Level: Selective   Following Commands: Follows one step commands with increased time;Follows one step commands consistently   Awareness: Intellectual Problem Solving: Slow processing General Comments: Patient awake/alert upon entry. After demo, patient able to take several hops with RW to sink surface for grooming tasks  in sitting.        Exercises     Shoulder Instructions       General Comments      Pertinent Vitals/ Pain       Pain Assessment: No/denies pain  Home Living                                          Prior Functioning/Environment              Frequency  Min 2X/week        Progress Toward Goals  OT Goals(current goals can now be found in the care plan section)     Acute Rehab OT  Goals Patient Stated Goal: to be able to get on [facility] OT Goal Formulation: With patient Time For Goal Achievement: 07/13/21 Potential to Achieve Goals: Good ADL Goals Pt Will Perform Grooming: with modified independence;standing Pt Will Perform Upper Body Bathing: Independently;sitting Pt Will Perform Lower Body Bathing: Independently;with modified independence;sit to/from stand Pt Will Perform Upper Body Dressing: with modified independence Pt Will Perform Lower Body Dressing: with modified independence;sit to/from stand Pt Will Transfer to Toilet: with modified independence;ambulating;regular height toilet Pt Will Perform Toileting - Clothing Manipulation and hygiene: with modified independence;sit to/from stand Pt Will Perform Tub/Shower Transfer: with modified independence;ambulating;shower seat;rolling walker  Plan Discharge plan remains appropriate;Frequency remains appropriate    Co-evaluation                 AM-PAC OT "6 Clicks" Daily Activity     Outcome Measure   Help from another person eating meals?: None Help from another person taking care of personal grooming?: A Little Help from another person toileting, which includes using toliet, bedpan, or urinal?: A Lot Help from another person bathing (including washing, rinsing, drying)?: A Lot Help from another person to put on and taking off regular upper body clothing?: A Little Help from another person to put on and taking off regular lower body clothing?: A Lot 6 Click Score: 16    End of Session Equipment Utilized During Treatment: Gait belt;Rolling walker  OT Visit Diagnosis: Unsteadiness on feet (R26.81);Other abnormalities of gait and mobility (R26.89);Repeated falls (R29.6)   Activity Tolerance Patient tolerated treatment well   Patient Left in bed;with call bell/phone within reach;with bed alarm set   Nurse Communication Mobility status;Other (comment) (Response to treatment)        Time:  0922-0950 OT Time Calculation (min): 28 min  Charges: OT General Charges $OT Visit: 1 Visit OT Treatments $Self Care/Home Management : 23-37 mins  Jesten Cappuccio H. OTR/L Supplemental OT, Department of rehab services [phone]   Nelissa Bolduc R H. 07/01/2021, 9:57 AM

## 2021-07-01 NOTE — Progress Notes (Signed)
Physical Therapy Treatment Patient Details Name: Sarah Long MRN: [id number] DOB: 1971-11-08 Today's Date: 07/01/2021    History of Present Illness Pt is a 51 y/o female admitted secondary to worsening fatigue, polydipsia and nausea in the setting of DKA. Pt admitted due to necrotizing fasciitis of L foot, uncontrolled DM2 and metabolic encephalopathy. 7/23 underwent L BKA. Noted osteo rt foot with foot ulcers   PMH but not limited to: T2DM, HTN, HLD, CHF, pancreatitis, hx of ETOh abuse, pacemaker implant (2021), bipolar and depression    PT Comments    Pt was seen for mobility from chair where she had been assisted earlier.  Took a short walk forward and back on her RW close to chair, and then rested to do ex's on BLE's.  Pt is motivated to get to rehab and home, and spent time discussing the rationale for all her ex's that were give to her.  Discharging to CIR this afternoon.   Follow Up Recommendations  CIR     Equipment Recommendations  Rolling walker with 5" wheels;Wheelchair (measurements PT);Wheelchair cushion (measurements PT)    Recommendations for Other Services Rehab consult     Precautions / Restrictions Precautions Precautions: Fall Required Braces or Orthoses: Other Brace Other Brace: residual limb guard Restrictions Weight Bearing Restrictions: No Other Position/Activity Restrictions: no orders related to RIGHT foot    Mobility  Bed Mobility Overal bed mobility: Needs Assistance             General bed mobility comments: up in chair when PT arrived    Transfers Overall transfer level: Needs assistance Equipment used: Rolling walker (2 wheeled) Transfers: Sit to/from Stand Sit to Stand: Min assist         General transfer comment: min assist to stand and steady on RW  Ambulation/Gait Ambulation/Gait assistance: Min assist Gait Distance (Feet): 14 Feet (7 x 2) Assistive device: Rolling walker (2 wheeled)       General Gait Details: pt  shifted on RLE to move forward and back at chair with cues for safety and postural control   Stairs             Wheelchair Mobility    Modified Rankin (Stroke Patients Only)       Balance Overall balance assessment: Needs assistance Sitting-balance support: Feet supported Sitting balance-Leahy Scale: Good     Standing balance support: Bilateral upper extremity supported;During functional activity Standing balance-Leahy Scale: Poor                              Cognition Arousal/Alertness: Awake/alert Behavior During Therapy: WFL for tasks assessed/performed Overall Cognitive Status: Within Functional Limits for tasks assessed                                        Exercises General Exercises - Lower Extremity Ankle Circles/Pumps: AAROM;Right;5 reps Amputee Exercises Hip ABduction/ADduction: AROM;AAROM;10 reps    General Comments        Pertinent Vitals/Pain Pain Assessment: No/denies pain    Home Living                      Prior Function            PT Goals (current goals can now be found in the care plan section) Acute Rehab PT Goals Patient Stated Goal: to walk  and get home Progress towards PT goals: Progressing toward goals    Frequency    Min 3X/week      PT Plan Current plan remains appropriate    Co-evaluation              AM-PAC PT "6 Clicks" Mobility   Outcome Measure  Help needed turning from your back to your side while in a flat bed without using bedrails?: None Help needed moving from lying on your back to sitting on the side of a flat bed without using bedrails?: A Little Help needed moving to and from a bed to a chair (including a wheelchair)?: A Little Help needed standing up from a chair using your arms (e.g., wheelchair or bedside chair)?: A Little Help needed to walk in hospital room?: A Little Help needed climbing 3-5 steps with a railing? : A Lot 6 Click Score: 18    End  of Session Equipment Utilized During Treatment: Gait belt Activity Tolerance: Patient tolerated treatment well Patient left: with call bell/phone within reach;in chair;with chair alarm set Nurse Communication: Mobility status PT Visit Diagnosis: Other abnormalities of gait and mobility (R26.89);Muscle weakness (generalized) (M62.81)     Time: [id number] PT Time Calculation (min) (ACUTE ONLY): 20 min  Charges:  $Gait Training: 8-22 mins $Therapeutic Exercise: 8-22 mins                Ramond Dial 07/01/2021, 3:05 PM  Mee Hives, PT MS Acute Rehab Dept. Number: [place] [facility] [place]

## 2021-07-01 NOTE — PMR Pre-admission (Signed)
PMR Admission Coordinator Pre-Admission Assessment  [name] is an 50 y.o., female MRN: [id number] DOB: 1971-10-29 Height: 5\' 4"  (162.6 cm) Weight: 90.7 kg  Insurance Information HMO:     PPO:      PCP:      IPA:      80/20:      OTHER:  PRIMARY: Medicaid of [place]       Policy#: [id number]      Subscriber: PT  CM Name:       Phone#:      Fax#:  Pre-Cert#:       Employer:  Benefits:  Phone #:      Name:  Eff. Date:   active, verified via passport onesource on 7/257/22   Deduct:       Out of Pocket Max:       Life Max:  CIR:       SNF:  Outpatient:      Co-Pay:  Home Health:       Co-Pay:  DME:      Co-Pay:  Providers: SECONDARY:       Policy#:      Phone#:   Development worker, community:       Phone#:   The Engineer, petroleum" for patients in Inpatient Rehabilitation Facilities with attached "Privacy Act [place] Records" was provided and verbally reviewed with: N/A  Emergency Contact Information Contact Information     Name Relation Home Work Mobile   [name] Daughter   [phone]   beverely, suen Daughter   [phone]       Current Medical History  Patient Admitting Diagnosis: L BKA  History of Present Illness: Pt  a 50 y/o female admitted secondary to worsening fatigue, polydipsia and nausea in the setting of DKA. Pt admitted due to necrotizing fasciitis of L foot, uncontrolled DM2 and metabolic encephalopathy. 7/23 underwent L BKA. Noted osteo rt foot with foot ulcers   PMH but not limited to: T2DM, HTN, HLD, CHF, pancreatitis, hx of ETOh abuse, pacemaker implant (2021), bipolar and depression      Patient's medical record from [facility] has been reviewed by the rehabilitation admission coordinator and physician.  Past Medical History  Past Medical History:  Diagnosis Date   Abnormal Pap smear 1998   Abnormal vaginal bleeding 12/19/2011   Acute urinary retention 06/26/2021   Bacterial infection    Bipolar  1 disorder (HCC)    Blister of second toe of left foot 11/21/2016   Candida vaginitis 07/2007   Depression    recently added wellbutrin-has not taken yet for bipolar   Diabetes in pregnancy    Diabetes mellitus    nph 20U qam and qpm, regular with meals   Diabetic ketoacidosis without coma associated with type 2 diabetes mellitus ([place])    Fibroid    Galactorrhea of right breast 2008   H/O amenorrhea 07/2007   H/O dizziness 10/14/2011   H/O dysmenorrhea 2010   H/O menorrhagia 10/14/2011   H/O varicella    Headache(784.0)    Heavy vaginal bleeding due to contraceptive injection use 10/12/2011   Depo Provera   Herpes    HSV-2 infection 01/03/2009   Hx: UTI (urinary tract infection) 2009   Hypertension    on aldomet   Increased BMI 2010   Irregular uterine bleeding 04/04/2012   Pt has mirena    Obesity 10/14/2011   Oligomenorrhea 07/2007   Pelvic pain in female    Presence of permanent cardiac  pacemaker    Preterm labor    Trichomonas    Yeast infection     Family History   family history includes Diabetes in her father and mother; Heart disease in her father and mother; Hypertension in her father and mother; Stroke in her maternal grandfather.  Prior Rehab/Hospitalizations Has the patient had prior rehab or hospitalizations prior to admission? No  Has the patient had major surgery during 100 days prior to admission? Yes   Current Medications  Current Facility-Administered Medications:    acetaminophen (TYLENOL) tablet 500 mg, 500 mg, Oral, TID WC & HS, Ezequiel Essex, MD, 500 mg at 07/01/21 0606   alum & mag hydroxide-simeth (MAALOX/MYLANTA) 200-200-20 MG/5ML suspension 15-30 mL, 15-30 mL, Oral, Q2H PRN, Newt Minion, MD, 30 mL at 06/30/21 5465   ascorbic acid (VITAMIN C) tablet 1,000 mg, 1,000 mg, Oral, Daily, Newt Minion, MD, 1,000 mg at 07/01/21 0354   bisacodyl (DULCOLAX) EC tablet 5 mg, 5 mg, Oral, Daily PRN, Newt Minion, MD   Chlorhexidine Gluconate  Cloth 2 % PADS 6 each, 6 each, Topical, Q0600, Newt Minion, MD, 6 each at 07/01/21 0606   chlorthalidone (HYGROTON) tablet 25 mg, 25 mg, Oral, Daily, [place], Ryan, DO, 25 mg at 07/01/21 6568   docusate sodium (COLACE) capsule 100 mg, 100 mg, Oral, Daily, Newt Minion, MD, 100 mg at 07/01/21 0809   doxycycline (VIBRA-TABS) tablet 100 mg, 100 mg, Oral, Q12H, Ezequiel Essex, MD, 100 mg at 07/01/21 0809   enoxaparin (LOVENOX) injection 40 mg, 40 mg, Subcutaneous, Q24H, Maness, Philip, MD, 40 mg at 06/30/21 1652   famotidine (PEPCID) tablet 20 mg, 20 mg, Oral, Daily, [place]born, Ryan, DO, 20 mg at 07/01/21 1275   insulin aspart (novoLOG) injection 0-20 Units, 0-20 Units, Subcutaneous, TID WC, Maness, Philip, MD, 7 Units at 06/30/21 1653   insulin aspart (novoLOG) injection 0-5 Units, 0-5 Units, Subcutaneous, QHS, Maness, Philip, MD   insulin glargine (LANTUS) injection 40 Units, 40 Units, Subcutaneous, Q24H, Ezequiel Essex, MD, 40 Units at 07/01/21 0607   lipase/protease/amylase (CREON) capsule 24,000 Units, 24,000 Units, Oral, TID Ronnette Hila, MD, 24,000 Units at 07/01/21 0606   liver oil-zinc oxide (DESITIN) 40 % ointment, , Topical, BID, Newt Minion, MD, Given at 07/01/21 0809   lurasidone (LATUDA) tablet 40 mg, 40 mg, Oral, Q supper, Ezequiel Essex, MD, 40 mg at 06/30/21 1652   melatonin tablet 3 mg, 3 mg, Oral, QHS PRN, Precious Gilding, DO, 3 mg at 06/30/21 2041   multivitamin with minerals tablet 1 tablet, 1 tablet, Oral, Daily, Newt Minion, MD, 1 tablet at 07/01/21 1700   nutrition supplement (JUVEN) (JUVEN) powder packet 1 packet, 1 packet, Oral, BID BM, Newt Minion, MD, 1 packet at 06/30/21 1438   oxyCODONE (Oxy IR/ROXICODONE) immediate release tablet 10-15 mg, 10-15 mg, Oral, Q4H PRN, Newt Minion, MD, 10 mg at 06/30/21 2355   oxyCODONE (Oxy IR/ROXICODONE) immediate release tablet 5-10 mg, 5-10 mg, Oral, Q4H PRN, Newt Minion, MD   pantoprazole (PROTONIX) EC tablet 40  mg, 40 mg, Oral, Daily, Newt Minion, MD, 40 mg at 07/01/21 0808   phenol (CHLORASEPTIC) mouth spray 1 spray, 1 spray, Mouth/Throat, PRN, Newt Minion, MD   polyethylene glycol (MIRALAX / GLYCOLAX) packet 17 g, 17 g, Oral, Daily PRN, Newt Minion, MD, 17 g at 07/01/21 1749   protein supplement (ENSURE MAX) liquid, 11 oz, Oral, QHS, Newt Minion, MD, 11 oz at 06/28/21  2249   rosuvastatin (CRESTOR) tablet 10 mg, 10 mg, Oral, Daily, Newt Minion, MD, 10 mg at 07/01/21 0165   zinc sulfate capsule 220 mg, 220 mg, Oral, Daily, Newt Minion, MD, 220 mg at 07/01/21 0809  Patients Current Diet:  Diet Order             Diet Carb Modified Fluid consistency: Thin; Room service appropriate? Yes  Diet effective now                   Precautions / Restrictions Precautions Precautions: Fall Other Brace: residual limb guard Restrictions Other Position/Activity Restrictions: no orders related to RIGHT foot   Has the patient had 2 or more falls or a fall with injury in the past year? No  Prior Activity Level Community (5-7x/wk): driving, working. out of house daily  Prior Functional Level Self Care: Did the patient need help bathing, dressing, using the toilet or eating? Independent  Indoor Mobility: Did the patient need assistance with walking from room to room (with or without device)? Independent  Stairs: Did the patient need assistance with internal or external stairs (with or without device)? Independent  Functional Cognition: Did the patient need help planning regular tasks such as shopping or remembering to take medications? Independent  Home Assistive Devices / Equipment Home Equipment: None  Prior Device Use: Indicate devices/aids used by the patient prior to current illness, exacerbation or injury? None of the above  Current Functional Level Cognition  Overall Cognitive Status: Within Functional Limits for tasks assessed Current Attention Level:  Selective Orientation Level: Oriented X4 Following Commands: Follows one step commands with increased time, Follows one step commands consistently Safety/Judgement: Decreased awareness of deficits General Comments: more lethargic on return for exercises; slower processing again seen    Extremity Assessment (includes Sensation/Coordination)  Upper Extremity Assessment: Overall WFL for tasks assessed  Lower Extremity Assessment: RLE deficits/detail, LLE deficits/detail, Generalized weakness RLE Deficits / Details: ulcers plantar aspect of 1st toe and 1st MTP joint RLE Sensation: history of peripheral neuropathy LLE Deficits / Details: new BKA; limbguard removed and has 0-100 knee flexion; hip 3+ LLE Sensation: history of peripheral neuropathy    ADLs  Overall ADL's : Needs assistance/impaired Eating/Feeding: Independent, Sitting Grooming: Wash/dry hands, Cueing for safety, Cueing for sequencing, Sitting, Set up Grooming Details (indicate cue type and reason): Grooming seated EOB after 3 episodes of emesis. Upper Body Bathing: Supervision/ safety, Cueing for safety, Cueing for sequencing, Sitting Lower Body Bathing: Cueing for safety, Cueing for sequencing, Moderate assistance, Sitting/lateral leans Upper Body Dressing : Supervision/safety, Sitting, Cueing for safety, Cueing for sequencing Lower Body Dressing: Moderate assistance, Cueing for safety, Cueing for sequencing, Bed level, Sitting/lateral leans Toilet Transfer: Minimal assistance, +2 for physical assistance, +2 for safety/equipment, RW, BSC Toileting- Clothing Manipulation and Hygiene: Total assistance, Cueing for safety, Cueing for sequencing, Sit to/from stand Functional mobility during ADLs: Minimal assistance, +2 for physical assistance, +2 for safety/equipment, Rolling walker General ADL Comments: Patient limited by N/V and pain in L residual limb.    Mobility  Overal bed mobility: Needs Assistance Bed Mobility: Sit to  Supine Rolling: Modified independent (Device/Increase time) Sidelying to sit: Min guard Supine to sit: Min assist Sit to supine: Min guard General bed mobility comments: up on BSC on arrival    Transfers  Overall transfer level: Needs assistance Equipment used: Rolling walker (2 wheeled) Transfers: Stand Pivot Transfers, Sit to/from Stand Sit to Stand: Min assist Stand pivot transfers: Min assist, +2  safety/equipment Squat pivot transfers: Min assist, +2 safety/equipment General transfer comment: from [facility] stood for pericare; +2 for lines (monitors, IV, VAC) as pivoting to recliner (somewhat impulsively,but was feeling nauseated and immediately asked for emesis basin); stood from recliner with max cues for sequencing (using left hand on RW, PT stabilizing rt side of RW, and pushing with RUE off surface)    Ambulation / Gait / Stairs / Wheelchair Mobility  Ambulation/Gait Ambulation/Gait assistance: Min assist, +2 safety/equipment Gait Distance (Feet): 5 Feet Assistive device: Rolling walker (2 wheeled) Gait Pattern/deviations: Step-to pattern General Gait Details: pt required cues for incr use of UEs to "cushion" her landing.Chair brought to pt to sit down as too much equipment and not enough room to turn around    Posture / Balance Balance Overall balance assessment: Needs assistance Sitting-balance support: Feet supported Sitting balance-Leahy Scale: Good Standing balance support: During functional activity, Bilateral upper extremity supported, Single extremity supported Standing balance-Leahy Scale: Poor    Special needs/care consideration Skin contact dermatitis on ilaterl buttocks, cracking on R foot, Diabetic management DM2 managed with novolog (0-20 units sQ 3x daily with meals and 0-5 units sQ daily at bedtime)and Langus (40 units sQ every 24 hours), and Special service needs Pt. Lives in an extended stay hotel   Previous [place] (from acute therapy  documentation) Living Arrangements: Children Available Help at Discharge: Family, Available PRN/intermittently Type of Home: Other(Comment) (extended stay hotel) Home Layout: One level Home Access: Level entry Bathroom Shower/Tub: Chiropodist: Standard Bathroom Accessibility: Yes How Accessible: Accessible via walker [place]: No Additional Comments: pt reported living in a hotel with her children 54 and 71 yo (currently with their dad); 60 yo staying with her 76 yo child  Discharge Living Setting Plans for Discharge Living Setting: Other (Comment) (extended stay hotel) Type of Home at Discharge: Other (Comment) (extended stay hotel) Discharge Home Layout: One level Discharge Home Access: Level entry Discharge Bathroom Shower/Tub: Tub/shower unit Discharge Bathroom Toilet: Standard Discharge Bathroom Accessibility: Yes How Accessible: Accessible via walker Does the patient have any problems obtaining your medications?: Yes (Describe) (expensive)  Social/Family/Support Systems Patient Roles: Other (Comment) Contact Information: [phone] Anticipated Caregiver: Nakyiah Kuck, daughter Anticipated Caregiver's Contact Information: [phone] Caregiver Availability: Intermittent Discharge Plan Discussed with Primary Caregiver: Yes Is Caregiver In Agreement with Plan?: Yes Does Caregiver/Family have Issues with Lodging/Transportation while Pt is in Rehab?: No  Goals Patient/Family Goal for Rehab: Mod I: PT/OT Expected length of stay: 8-10 days Pt/Family Agrees to Admission and willing to participate: Yes Program Orientation Provided & Reviewed with Pt/Caregiver Including Roles  & Responsibilities: Yes  Decrease burden of Care through IP rehab admission: Specialzed equipment needs, Decrease number of caregivers, and Patient/family education  Possible need for SNF placement upon discharge: Not anticipated   Patient Condition: I have reviewed  medical records from [facility], spoken with CM, and patient and daughter. I met with patient at the bedside and discussed via phone for inpatient rehabilitation assessment.  Patient will benefit from ongoing PT and OT, can actively participate in 3 hours of therapy a day 5 days of the week, and can make measurable gains during the admission.  Patient will also benefit from the coordinated team approach during an Inpatient Acute Rehabilitation admission.  The patient will receive intensive therapy as well as Rehabilitation physician, nursing, social worker, and care management interventions.  Due to safety, skin/wound care, disease management, medication administration, pain management, and patient education the patient  requires 24 hour a day rehabilitation nursing.  The patient is currently min A-mod A with mobility and basic ADLs.  Discharge setting and therapy post discharge at home with home health is anticipated.  Patient has agreed to participate in the Acute Inpatient Rehabilitation Program and will admit today.  Preadmission Screen Completed By:  Genella Mech, 07/01/2021 8:47 AM ______________________________________________________________________   Discussed status with Dr. Letta Pate on 07/01/21 at 1000 and received approval for admission today.  Admission Coordinator:  Genella Mech, CCC-SLP, time 1000/Date 07/01/21   Assessment/Plan: Diagnosis:Left BKA Does the need for close, 24 hr/day Medical supervision in concert with the patient's rehab needs make it unreasonable for this patient to be served in a less intensive setting? Yes Co-Morbidities requiring supervision/potential complications: Type 2 DM,Pancreatitis,hx ETOH, HTN, CHF Due to bladder management, bowel management, safety, skin/wound care, disease management, medication administration, pain management, and patient education, does the patient require 24 hr/day rehab nursing? Yes Does the patient require coordinated  care of a physician, rehab nurse, PT, OT, and SLP to address physical and functional deficits in the context of the above medical diagnosis(es)? Yes Addressing deficits in the following areas: balance, endurance, locomotion, strength, transferring, bowel/bladder control, bathing, dressing, feeding, grooming, toileting, cognition, and psychosocial support Can the patient actively participate in an intensive therapy program of at least 3 hrs of therapy 5 days a week? Yes The potential for patient to make measurable gains while on inpatient rehab is good Anticipated functional outcomes upon discharge from inpatient rehab: modified independent PT, modified independent OT, n/a SLP Estimated rehab length of stay to reach the above functional goals is: 7-10d Anticipated discharge destination: Home 10. Overall Rehab/Functional Prognosis: good   MD Signature: Charlett Blake M.D. [facility] Fellow Am Acad of Phys Med and Rehab Diplomate Am Board of Electrodiagnostic Med Fellow Am Board of Interventional Pain

## 2021-07-01 NOTE — Progress Notes (Signed)
Patient arrived on unit, oriented to unit. Reviewed medications, therapy schedule, rehab routine and plan of care. States an understanding of information reviewed. No complications noted at this time. Patient reports no pain and is AX4 Sarah Long  

## 2021-07-02 ENCOUNTER — Ambulatory Visit: Payer: Medicaid Other | Admitting: Pharmacist

## 2021-07-02 DIAGNOSIS — E1169 Type 2 diabetes mellitus with other specified complication: Secondary | ICD-10-CM | POA: Diagnosis not present

## 2021-07-02 DIAGNOSIS — Z89512 Acquired absence of left leg below knee: Secondary | ICD-10-CM | POA: Diagnosis not present

## 2021-07-02 DIAGNOSIS — I10 Essential (primary) hypertension: Secondary | ICD-10-CM | POA: Diagnosis not present

## 2021-07-02 DIAGNOSIS — I5032 Chronic diastolic (congestive) heart failure: Secondary | ICD-10-CM

## 2021-07-02 DIAGNOSIS — I739 Peripheral vascular disease, unspecified: Secondary | ICD-10-CM

## 2021-07-02 DIAGNOSIS — E669 Obesity, unspecified: Secondary | ICD-10-CM

## 2021-07-02 LAB — COMPREHENSIVE METABOLIC PANEL
ALT: 11 U/L (ref 0–44)
AST: 18 U/L (ref 15–41)
Albumin: 1.5 g/dL — ABNORMAL LOW (ref 3.5–5.0)
Alkaline Phosphatase: 69 U/L (ref 38–126)
Anion gap: 3 — ABNORMAL LOW (ref 5–15)
BUN: 21 mg/dL — ABNORMAL HIGH (ref 6–20)
CO2: 27 mmol/L (ref 22–32)
Calcium: 7.6 mg/dL — ABNORMAL LOW (ref 8.9–10.3)
Chloride: 105 mmol/L (ref 98–111)
Creatinine, Ser: 1.14 mg/dL — ABNORMAL HIGH (ref 0.44–1.00)
GFR, Estimated: 60 mL/min — ABNORMAL LOW (ref >60)
Glucose, Bld: 134 mg/dL — ABNORMAL HIGH (ref 70–99)
Potassium: 3.5 mmol/L (ref 3.5–5.1)
Sodium: 135 mmol/L (ref 135–145)
Total Bilirubin: 0.4 mg/dL (ref 0.3–1.2)
Total Protein: 6.1 g/dL — ABNORMAL LOW (ref 6.5–8.1)

## 2021-07-02 LAB — CBC WITH DIFFERENTIAL/PLATELET
Abs Immature Granulocytes: 0.15 10*3/uL — ABNORMAL HIGH (ref 0.00–0.07)
Basophils Absolute: 0 10*3/uL (ref 0.0–0.1)
Basophils Relative: 0 %
Eosinophils Absolute: 0.1 10*3/uL (ref 0.0–0.5)
Eosinophils Relative: 1 %
HCT: 25.6 % — ABNORMAL LOW (ref 36.0–46.0)
Hemoglobin: 7.6 g/dL — ABNORMAL LOW (ref 12.0–15.0)
Immature Granulocytes: 1 %
Lymphocytes Relative: 16 %
Lymphs Abs: 2 10*3/uL (ref 0.7–4.0)
MCH: 27 pg (ref 26.0–34.0)
MCHC: 29.7 g/dL — ABNORMAL LOW (ref 30.0–36.0)
MCV: 90.8 fL (ref 80.0–100.0)
Monocytes Absolute: 0.6 10*3/uL (ref 0.1–1.0)
Monocytes Relative: 5 %
Neutro Abs: 9.6 10*3/uL — ABNORMAL HIGH (ref 1.7–7.7)
Neutrophils Relative %: 77 %
Platelets: 300 10*3/uL (ref 150–400)
RBC: 2.82 MIL/uL — ABNORMAL LOW (ref 3.87–5.11)
RDW: 16.9 % — ABNORMAL HIGH (ref 11.5–15.5)
WBC: 12.5 10*3/uL — ABNORMAL HIGH (ref 4.0–10.5)
nRBC: 0 % (ref 0.0–0.2)

## 2021-07-02 LAB — GLUCOSE, CAPILLARY
Glucose-Capillary: 131 mg/dL — ABNORMAL HIGH (ref 70–99)
Glucose-Capillary: 104 mg/dL — ABNORMAL HIGH (ref 70–99)
Glucose-Capillary: 125 mg/dL — ABNORMAL HIGH (ref 70–99)
Glucose-Capillary: 184 mg/dL — ABNORMAL HIGH (ref 70–99)

## 2021-07-02 LAB — HEMOGLOBIN A1C
Hgb A1c MFr Bld: 13.2 % — ABNORMAL HIGH (ref 4.8–5.6)
Mean Plasma Glucose: 332.14 mg/dL

## 2021-07-02 MED ORDER — CHLORHEXIDINE GLUCONATE CLOTH 2 % EX PADS
6.0000 | MEDICATED_PAD | Freq: Every day | CUTANEOUS | Status: DC
  Administered 2021-07-02 – 2021-07-09 (×8): 6 via TOPICAL

## 2021-07-02 MED ORDER — SODIUM CHLORIDE 0.9% FLUSH: 10.0000 mL | INTRAVENOUS | Status: DC | PRN

## 2021-07-02 MED ORDER — HYDROCERIN EX CREA
TOPICAL_CREAM | Freq: Two times a day (BID) | CUTANEOUS | Status: DC
  Administered 2021-07-05 – 2021-07-07 (×2): 1 via TOPICAL
  Filled 2021-07-02: qty 113

## 2021-07-02 NOTE — Progress Notes (Signed)
Physical Therapy Session Note  Patient Details  Name: Sarah Long MRN: [id number] Date of Birth: Sep 07, 1971  Today's Date: 07/02/2021 PT Individual Time: [id number] PT Individual Time Calculation (min): 47 min   Short Term Goals: Week 1:  PT Short Term Goal 1 (Week 1): Patient will perform squat pivot transfers with S PT Short Term Goal 2 (Week 1): Patient will ambulated 63' with CGA with RW PT Short Term Goal 3 (Week 1): Patient will perform w/c mobility S over level indoor surfaces 150' PT Short Term Goal 4 (Week 1): Patient to perform standing balance activity x 2 minutes prior to needing seated rest.  Skilled Therapeutic Interventions/Progress Updates:    Patient in supine and reports needing to urinate.  Assist to don limb protector in supine.  Patient to sitting with S.  Performed transfer to w/c with demonstration for squat pivot and min A.  Patient assisted to bathroom in w/c and performed sit to stand and step to 3:1 over toilet with min A.  Patient completed toilet hygiene and clothing management with min A.  Stand step to w/c with RW and min A.  She c/o pain in L residual limb and RN made aware pt needing medication.  Patient assisted in w/c to gym.  Obtained w/c gloves for pt and she performed w/c mobility about 78' with S.  In parallel bars demonstrated 1 4" step negotiation and pt performed with min A x 2 reps.  C/o sharp pain in L residual limb and requested to be done with working with her leg.  Agreed to propel to room and propelled x 140' with S with several stops in hallway for medication with RN and for CBG check with NT.  Patient in room left seated in w/c with alarm belt active and daughter and son in the room. She missed 13 minutes skilled PT due to fatigue.   Therapy Documentation Precautions:  Precautions Precautions: Fall Precaution Comments: L BKA Required Braces or Orthoses: Other Brace Other Brace: residual limb guard Restrictions Weight Bearing Restrictions:  Yes LLE Weight Bearing: Non weight bearing Other Position/Activity Restrictions: no orders related to RIGHT foot per chart General: PT Amount of Missed Time (min): 13 Minutes PT Missed Treatment Reason: Patient fatigue Vital Signs:  Pain: Pain Assessment Pain Scale: 0-10 Pain Score: 5  Pain Type: Surgical pain Pain Location: Leg Pain Orientation: Left Pain Descriptors / Indicators: Sharp Pain Onset: With Activity Pain Intervention(s): Repositioned;RN made aware   Therapy/Group: Individual Therapy  Reginia Naas [place], [place] 07/02/2021, 6:05 PM

## 2021-07-02 NOTE — Evaluation (Signed)
Occupational Therapy Assessment and Plan  Patient Details  Name: Sarah Long MRN: [id number] Date of Birth: 1971/10/21  OT Diagnosis: abnormal posture, muscle weakness (generalized), and L BKA Rehab Potential:   ELOS: 10-14 days   Today's Date: 07/02/2021 OT Individual Time: 1100-1200 OT Individual Time Calculation (min): 60 min     Hospital Problem: Active Problems:   Left below-knee amputee [facility])   Past Medical History:  Past Medical History:  Diagnosis Date   Abnormal Pap smear 1998   Abnormal vaginal bleeding 12/19/2011   Acute urinary retention 06/26/2021   Bacterial infection    Bipolar 1 disorder (HCC)    Blister of second toe of left foot 11/21/2016   Candida vaginitis 07/2007   Depression    recently added wellbutrin-has not taken yet for bipolar   Diabetes in pregnancy    Diabetes mellitus    nph 20U qam and qpm, regular with meals   Diabetic ketoacidosis without coma associated with type 2 diabetes mellitus ([place])    Fibroid    Galactorrhea of right breast 2008   H/O amenorrhea 07/2007   H/O dizziness 10/14/2011   H/O dysmenorrhea 2010   H/O menorrhagia 10/14/2011   H/O varicella    Headache(784.0)    Heavy vaginal bleeding due to contraceptive injection use 10/12/2011   Depo Provera   Herpes    HSV-2 infection 01/03/2009   Hx: UTI (urinary tract infection) 2009   Hypertension    on aldomet   Increased BMI 2010   Irregular uterine bleeding 04/04/2012   Pt has mirena    Obesity 10/14/2011   Oligomenorrhea 07/2007   Pelvic pain in female    Presence of permanent cardiac pacemaker    Preterm labor    Trichomonas    Yeast infection    Past Surgical History:  Past Surgical History:  Procedure Laterality Date   ABDOMINAL AORTOGRAM W/LOWER EXTREMITY N/A 06/17/2021   Procedure: ABDOMINAL AORTOGRAM W/LOWER EXTREMITY;  Surgeon: Wellington Hampshire, MD;  Location: [place] [facility];  Service: Cardiovascular;  Laterality: N/A;   AMPUTATION Left 06/27/2021    Procedure: AMPUTATION BELOW KNEE;  Surgeon: Newt Minion, MD;  Location: [place];  Service: Orthopedics;  Laterality: Left;   CESAREAN SECTION  1991   LEFT HEART CATH AND CORONARY ANGIOGRAPHY N/A 02/10/2020   Procedure: LEFT HEART CATH AND CORONARY ANGIOGRAPHY;  Surgeon: Troy Sine, MD;  Location: [place] [facility];  Service: Cardiovascular;  Laterality: N/A;   PACEMAKER IMPLANT N/A 02/13/2020   Procedure: PACEMAKER IMPLANT;  Surgeon: Constance Haw, MD;  Location: [place] [facility];  Service: Cardiovascular;  Laterality: N/A;   TEMPORARY PACEMAKER N/A 02/10/2020   Procedure: TEMPORARY PACEMAKER;  Surgeon: Troy Sine, MD;  Location: [place] [facility];  Service: Cardiovascular;  Laterality: N/A;    Assessment & Plan Clinical Impression: Arbell Wycoff. Sultan is a 50 year old right-handed female with history of uncontrolled diabetes mellitus, bipolar disorder, CKD stage III with creatinine 1.93 06/18/2021, peripheral vascular disease, diastolic congestive heart failure, pancreatitis, obesity with BMI 34.33, tobacco/alcohol use.  Per chart review patient is currently living in a hotel with her 57 and 39 year old child.  She does have a 61 year old daughter in the area who is also taking care of the patient's 55 year old son.  Presented 06/25/2020 with gangrenous abscess and ascending necrotizing fasciitis of the left foot..  Wound was not felt to be salvageable and underwent left BKA 06/27/2021 per Dr. Sharol Given.  Wound VAC as directed.  Placed on Lovenox for DVT prophylaxis.  Acute on chronic anemia 8.2 and monitored.  Leukocytosis 20,000 improved from 33,500.  Monitoring of renal function AKI /CKD stage III admission creatinine 3.12 improved to 1.07.  Therapy evaluations completed due to patient decreased functional mobility was admitted for a comprehensive rehab program.Patient transferred to CIR on 07/01/2021 .    Patient currently requires mod with basic self-care skills secondary to muscle weakness,  decreased cardiorespiratoy endurance, decreased motor planning, decreased problem solving and delayed processing, and decreased sitting balance, decreased standing balance, decreased postural control, and decreased balance strategies.  Prior to hospitalization, patient could complete BADL with independent .  Patient will benefit from skilled intervention to increase independence with basic self-care skills and increase level of independence with iADL prior to discharge home independently.  Anticipate patient will require intermittent supervision PRN from friends/family and no further OT follow recommended.  OT - End of Session Activity Tolerance: Tolerates 30+ min activity with multiple rests Endurance Deficit: Yes Endurance Deficit Description: nausea after standing ambulation OT Assessment OT Barriers to Discharge: Decreased caregiver support OT Patient demonstrates impairments in the following area(s): Balance;Cognition;Edema;Endurance;Motor;Pain;Safety;Sensory;Skin Integrity OT Basic ADL's Functional Problem(s): Grooming;Bathing;Dressing;Toileting OT Advanced ADL's Functional Problem(s): Simple Meal Preparation OT Transfers Functional Problem(s): Toilet;Tub/Shower OT Additional Impairment(s): None OT Plan OT Intensity: Minimum of 1-2 x/day, 45 to 90 minutes OT Frequency: 5 out of 7 days OT Duration/Estimated Length of Stay: 10-14 days OT Treatment/Interventions: Balance/vestibular training;Discharge planning;Pain management;Self Care/advanced ADL retraining;Therapeutic Activities;UE/LE Coordination activities;Visual/perceptual remediation/compensation;Therapeutic Exercise;Skin care/wound managment;Patient/family education;Functional mobility training;Disease mangement/prevention;Cognitive remediation/compensation;Community reintegration;DME/adaptive equipment instruction;Neuromuscular re-education;Psychosocial support;UE/LE Strength taining/ROM;Wheelchair  propulsion/positioning;Splinting/orthotics OT Self Feeding Anticipated Outcome(s): no goal OT Basic Self-Care Anticipated Outcome(s): Mod I OT Toileting Anticipated Outcome(s): Mod I OT Bathroom Transfers Anticipated Outcome(s): Mod I OT Recommendation Recommendations for Other Services: Speech consult (pt stating feels like brain is "foggy" after sugery) Follow Up Recommendations: None Equipment Recommended: To be determined;Tub/shower bench   OT Evaluation Precautions/Restrictions  Precautions Precautions: Fall Precaution Comments: L BKA Required Braces or Orthoses: Other Brace Other Brace: residual limb guard Restrictions Weight Bearing Restrictions: Yes LLE Weight Bearing: Non weight bearing Other Position/Activity Restrictions: no orders related to RIGHT foot per chart Vital Signs Therapy Vitals Temp: 98.2 F (36.8 C) Pulse Rate: 81 Resp: 15 BP: (!) 150/86 Patient Position (if appropriate): Lying Oxygen Therapy SpO2: 100 % O2 Device: Room Air Pain Pain Assessment Pain Scale: 0-10 Pain Score: 0-No pain Home Living/Prior Functioning Home Living Family/patient expects to be discharged to:: Private residence Living Arrangements: Children Available Help at Discharge: Family, Available PRN/intermittently Type of Home: Other(Comment) (at extended stay hotel) Home Access: Level entry Home Layout: One level Bathroom Shower/Tub: Chiropodist: Handicapped height Bathroom Accessibility: Yes Additional Comments: has 59, 45, 37 & 67 y.o. 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 Vision Baseline Vision/History: Wears glasses Wears Glasses: Reading only Patient Visual Report: No change from baseline Vision Assessment?: Yes Perception  Perception: Within Functional Limits Praxis Praxis: Intact Cognition Overall Cognitive Status: Within Functional Limits for tasks assessed (pt reports cognition feels "foggy" after surgery) Arousal/Alertness: Awake/alert Orientation Level: Person;Place;Situation Person: Oriented Place: Oriented Situation: Oriented Year: 2022 Month: July  Day of Week: Correct Memory: Appears intact Immediate Memory Recall: Sock;Blue;Bed Memory Recall Sock: Without Cue Memory Recall Blue: Without Cue Memory Recall Bed: Without Cue Attention: Selective;Sustained Sustained Attention: Appears intact Selective Attention: Appears intact Awareness: Appears intact Problem Solving: Appears intact (delayed and extended time to problem solve) Safety/Judgment: Appears intact Sensation Sensation Light Touch: Impaired by gross assessment Hot/Cold: Not tested Proprioception: Impaired by gross assessment Stereognosis: Not tested Additional Comments: absent R LE on bottom of great toe and second toe, diminished on plantar surface, but present, proprioception not 100% on great toe or ankle Coordination Gross Motor Movements are Fluid and Coordinated: No Fine Motor Movements are Fluid and Coordinated: Yes Coordination and Movement Description: limited L LE due to new amputation Finger Nose Finger Test: WFL, mildly uncoordinated d/t weakness Motor  Motor Motor: Other (comment) (mildly uncoordinated d/t new LLE amputee) Motor - Skilled Clinical Observations: Generalized weakness  Trunk/Postural Assessment  Cervical Assessment Cervical Assessment: Within Functional Limits Thoracic Assessment Thoracic Assessment: Exceptions to [facility] Lumbar Assessment Lumbar Assessment: Exceptions to [facility] Postural Control Postural Control: Deficits on  evaluation Protective Responses: delayed  Balance Balance Balance Assessed: Yes Static Sitting Balance Static Sitting - Balance Support: No upper extremity supported;Feet supported Static Sitting - Level of Assistance: 5: Stand by assistance Dynamic Sitting Balance Dynamic Sitting - Balance Support: Feet supported Dynamic Sitting - Level of Assistance: 5: Stand by assistance Dynamic Sitting - Balance Activities: Forward lean/weight shifting;Reaching for objects Sitting balance - Comments: putting on shirt Static Standing Balance Static Standing - Balance Support: Bilateral upper extremity supported Static Standing - Level of Assistance: 4: Min assist Dynamic Standing Balance Dynamic Standing - Balance Support: During functional activity;Bilateral upper extremity supported Dynamic Standing - Level of Assistance: 4: Min assist Dynamic Standing - Balance Activities: Reaching for objects Dynamic Standing - Comments: pulling up pants Extremity/Trunk Assessment RUE Assessment RUE Assessment: Within Functional Limits General Strength Comments: strength grossly 4-/5 and generalized weakness present but still functional for ADL LUE Assessment LUE Assessment: Within Functional Limits General Strength Comments: strength grossly 4-/5 and generalized weakness present but still functional for ADL  Care Tool Care Tool Self Care Eating    Set up    Oral Care    Oral Care Assist Level: Set up assist    Bathing   Body parts bathed by patient: Right arm;Left arm;Chest;Abdomen;Front perineal area;Buttocks;Right upper leg;Left upper leg;Right lower leg;Face Body parts bathed by helper: Right lower leg Body parts n/a: Left lower leg Assist Level: Minimal Assistance - Patient > 75%    Upper Body Dressing(including orthotics)   What is the patient wearing?: Pull over shirt   Assist Level: Set up assist    Lower Body Dressing (excluding footwear)   What is the patient wearing?: Pants Assist  for lower body dressing: Moderate Assistance - Patient 50 - 74%    Putting on/Taking off footwear   What is the patient wearing?: Non-skid slipper socks Assist for footwear: Minimal Assistance - Patient > 75%       Care Tool Toileting Toileting activity   Assist for toileting: Moderate Assistance - Patient 50 - 74%     Care Tool Bed Mobility Roll left and right activity   Roll left and right assist level: Supervision/Verbal cueing    Sit to lying activity   Sit to lying assist level: Supervision/Verbal cueing    Lying to sitting edge of bed activity   Lying to sitting edge of bed assist level: Supervision/Verbal cueing  Care Tool Transfers Sit to stand transfer   Sit to stand assist level: Minimal Assistance - Patient > 75% Sit to stand assistive device: Walker  Chair/bed transfer   Chair/bed transfer assist level: Minimal Assistance - Patient > 75%     Toilet transfer   Assist Level: Minimal Assistance - Patient > 75%     Care Tool Cognition Expression of Ideas and Wants Expression of Ideas and Wants: Without difficulty (complex and basic) - expresses complex messages without difficulty and with speech that is clear and easy to understand   Understanding Verbal and Non-Verbal Content Understanding Verbal and Non-Verbal Content: Understands (complex and basic) - clear comprehension without cues or repetitions   Memory/Recall Ability *first 3 days only Memory/Recall Ability *first 3 days only: That he or she is in a hospital/hospital unit;Current season    Refer to Care Plan for Long Term Goals  SHORT TERM GOAL WEEK 1 OT Short Term Goal 1 (Week 1): Pt will perform LB bathing with use of LHS with CGA OT Short Term Goal 2 (Week 1): Pt will perform LB dressing with AE PRN with CGA OT Short Term Goal 3 (Week 1): Pt will complete ADL/activity of choice for 10 mins without rest break OT Short Term Goal 4 (Week 1): Pt will perform BSC/toilet transfer with CGA/close  supervision and LRAD  Recommendations for other services: Other: SLP for high level cognition    Skilled Therapeutic Intervention ADL ADL Eating: Not assessed Grooming: Setup Upper Body Bathing: Supervision/safety Lower Body Bathing: Minimal assistance Upper Body Dressing: Setup Lower Body Dressing: Moderate assistance Toileting: Moderate assistance Toilet Transfer: Minimal assistance Toilet Transfer Method: Stand pivot Toilet Transfer Equipment: Bedside commode Mobility  Bed Mobility Bed Mobility: Supine to Sit;Sit to Supine;Sitting - Scoot to Edge of Bed Supine to Sit: Supervision/Verbal cueing Sitting - Scoot to Edge of Bed: Supervision/Verbal cueing Sit to Supine: Supervision/Verbal cueing Transfers Sit to Stand: Minimal Assistance - Patient > 75% Stand to Sit: Minimal Assistance - Patient > 75%   Skilled Interventions: Pt greeted at time of session on [facility] with NT, hand off to OT. Later discussed role and purpose of OT as well as structure of CIR. Pt verbalized understanding.  Focus of session on ADL retraining with sit <> stands Min A, stand pivot and squat pivot transfers Min A as well throughout session. Sink level bathing with standing to doff clothing, standing to wash buttocks/periarea. Assist for LB bathing for standing balance during dynamic tasks and to wash R foot. Min A to don sock, Min A to thread R foot and assist threading wound vac through pt shorts. Pt Min/Mod overall for LB self care tasks. Limited by fatigue as well. Once back in bed, education on importance of knee extension and initiated limb loss education. Pt set up with alarm on call bell in reach.    Discharge Criteria: Patient will be discharged from OT if patient refuses treatment 3 consecutive times without medical reason, if treatment goals not met, if there is a change in medical status, if patient makes no progress towards goals or if patient is discharged from hospital.  The above assessment,  treatment plan, treatment alternatives and goals were discussed and mutually agreed upon: by patient  Viona Gilmore 07/02/2021, 2:34 PM

## 2021-07-02 NOTE — Evaluation (Signed)
Physical Therapy Assessment and Plan  Patient Details  Name: Sarah Long MRN: [id number] Date of Birth: [birth year]  PT Diagnosis: Abnormality of gait, Muscle weakness, and Pain in L leg Rehab Potential: Good ELOS: 10-14 days   Today's Date: 07/02/2021 PT Individual Time: 0900-1005 PT Individual Time Calculation (min): 65 min    Hospital Problem: Active Problems:   Left below-knee amputee [facility])   Past Medical History:  Past Medical History:  Diagnosis Date   Abnormal Pap smear 1998   Abnormal vaginal bleeding 12/19/2011   Acute urinary retention 06/26/2021   Bacterial infection    Bipolar 1 disorder (HCC)    Blister of second toe of left foot 11/21/2016   Candida vaginitis 07/2007   Depression    recently added wellbutrin-has not taken yet for bipolar   Diabetes in pregnancy    Diabetes mellitus    nph 20U qam and qpm, regular with meals   Diabetic ketoacidosis without coma associated with type 2 diabetes mellitus ([place])    Fibroid    Galactorrhea of right breast 2008   H/O amenorrhea 07/2007   H/O dizziness 10/14/2011   H/O dysmenorrhea 2010   H/O menorrhagia 10/14/2011   H/O varicella    Headache(784.0)    Heavy vaginal bleeding due to contraceptive injection use 10/12/2011   Depo Provera   Herpes    HSV-2 infection 01/03/2009   Hx: UTI (urinary tract infection) 2009   Hypertension    on aldomet   Increased BMI 2010   Irregular uterine bleeding 04/04/2012   Pt has mirena    Obesity 10/14/2011   Oligomenorrhea 07/2007   Pelvic pain in female    Presence of permanent cardiac pacemaker    Preterm labor    Trichomonas    Yeast infection    Past Surgical History:  Past Surgical History:  Procedure Laterality Date   ABDOMINAL AORTOGRAM W/LOWER EXTREMITY N/A 06/17/2021   Procedure: ABDOMINAL AORTOGRAM W/LOWER EXTREMITY;  Surgeon: Wellington Hampshire, MD;  Location: [place] [facility];  Service: Cardiovascular;  Laterality: N/A;   AMPUTATION Left 06/27/2021    Procedure: AMPUTATION BELOW KNEE;  Surgeon: Newt Minion, MD;  Location: [place];  Service: Orthopedics;  Laterality: Left;   CESAREAN SECTION  1991   LEFT HEART CATH AND CORONARY ANGIOGRAPHY N/A 02/10/2020   Procedure: LEFT HEART CATH AND CORONARY ANGIOGRAPHY;  Surgeon: Troy Sine, MD;  Location: [place] [facility];  Service: Cardiovascular;  Laterality: N/A;   PACEMAKER IMPLANT N/A 02/13/2020   Procedure: PACEMAKER IMPLANT;  Surgeon: Constance Haw, MD;  Location: [place] [facility];  Service: Cardiovascular;  Laterality: N/A;   TEMPORARY PACEMAKER N/A 02/10/2020   Procedure: TEMPORARY PACEMAKER;  Surgeon: Troy Sine, MD;  Location: [place] [facility];  Service: Cardiovascular;  Laterality: N/A;    Assessment & Plan Clinical Impression: Leyna Vanderkolk. Disbrow is a 50 year old right-handed female with history of uncontrolled diabetes mellitus, bipolar disorder, CKD stage III with creatinine 1.93 06/18/2021, peripheral vascular disease, diastolic congestive heart failure, pancreatitis, obesity with BMI 34.33, tobacco/alcohol use.  Per chart review patient is currently living in a hotel with her 23 and 48 year old child.  She does have a 48 year old daughter in the area who is also taking care of the patient's 67 year old son.  Presented 06/25/2020 with gangrenous abscess and ascending necrotizing fasciitis of the left foot..  Wound was not felt to be salvageable and underwent left BKA 06/27/2021 per Dr. Sharol Given.  Wound VAC as directed.  Placed on Lovenox for DVT prophylaxis.  Acute on chronic anemia 8.2 and monitored.  Leukocytosis 20,000 improved from 33,500.  Monitoring of renal function AKI /CKD stage III admission creatinine 3.12 improved to 1.07.  Therapy evaluations completed due to patient decreased functional mobility was admitted for a comprehensive rehab program.  Patient transferred to CIR on 07/01/2021 .   Patient currently requires mod with mobility secondary to muscle weakness and pain L LE and  decreased sitting balance, decreased standing balance, and decreased postural control.  Prior to hospitalization, patient was independent  with mobility and lived with Alone in a Other(Comment) (at extended stay hotel) home.  Home access is   .  Patient will benefit from skilled PT intervention to maximize safe functional mobility and minimize fall risk for planned discharge home with intermittent assist.  Anticipate patient will benefit from follow up [facility] at discharge.  PT - End of Session Activity Tolerance: Decreased this session;Tolerates 30+ min activity with multiple rests Endurance Deficit: Yes Endurance Deficit Description: nausea after standing ambulation PT Assessment Rehab Potential (ACUTE/IP ONLY): Good PT Barriers to Discharge: Decreased caregiver support;Weight bearing restrictions PT Barriers to Discharge Comments: family can assist, but not stay with pt PT Patient demonstrates impairments in the following area(s): Balance;Pain;Endurance;Motor;Sensory;Safety PT Transfers Functional Problem(s): Bed Mobility;Bed to Chair;Car;Furniture PT Locomotion Functional Problem(s): Ambulation;Wheelchair Mobility;Stairs PT Plan PT Intensity: Minimum of 1-2 x/day ,45 to 90 minutes PT Frequency: 5 out of 7 days PT Duration Estimated Length of Stay: 10-14 days PT Treatment/Interventions: Ambulation/gait training;DME/adaptive equipment instruction;Balance/vestibular training;Community reintegration;Disease management/prevention;Neuromuscular re-education;Patient/family education;Skin care/wound management;Stair training;Therapeutic Exercise;UE/LE Coordination activities;Wheelchair propulsion/positioning;UE/LE Strength taining/ROM;Therapeutic Activities;Splinting/orthotics;Psychosocial support;Pain management;Functional mobility training;Discharge planning PT Transfers Anticipated Outcome(s): mod I PT Locomotion Anticipated Outcome(s): mod I with w/c versus walker PT Recommendation Follow Up  Recommendations: Home health PT Patient destination: Home Equipment Recommended: Rolling walker with 5" wheels;Wheelchair (measurements);Wheelchair cushion (measurements);3 in 1 bedside comode Equipment Details: 18x18" w/c with amputee support and standard RW   PT Evaluation Precautions/Restrictions Precautions Precautions: Fall Precaution Comments: L BKA Required Braces or Orthoses: Other Brace Other Brace: residual limb guard Restrictions Weight Bearing Restrictions: Yes LLE Weight Bearing: Non weight bearing Other Position/Activity Restrictions: no orders related to RIGHT foot per chart  Pain Pain Assessment Pain Scale: 0-10 Pain Score: 5  Pain Type: Surgical pain Pain Location: Leg Pain Orientation: Left Pain Descriptors / Indicators: Sharp Pain Onset: With Activity Pain Intervention(s): Repositioned;RN made aware Home Living/Prior Functioning Home Living Living Arrangements: Children Available Help at Discharge: Family;Available PRN/intermittently Type of Home: Other(Comment) (at extended stay hotel) Home Layout: One level Bathroom Shower/Tub: Chiropodist: Handicapped height Bathroom Accessibility: Yes Additional Comments: has 60, 79, 44 & 46 y.o. 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 Vision/Perception  Vision - Assessment Additional Comments: reports just diagnosed with cataract thinks it may be right Perception Perception: Within Functional Limits Praxis Praxis: Intact  Cognition Overall Cognitive Status: Within Functional Limits for tasks assessed Arousal/Alertness: Awake/alert Orientation Level: Oriented  X4 Safety/Judgment: Appears intact Sensation Sensation Light Touch: Impaired by gross assessment Hot/Cold: Not tested Proprioception: Impaired by gross assessment Stereognosis: Not tested Additional Comments: absent R LE on bottom of great toe and second toe, diminished on plantar surface, but  present, proprioception not 100% on great toe or ankle Coordination Gross Motor Movements are Fluid and Coordinated: No Fine Motor Movements are Fluid and Coordinated: Yes Coordination and Movement Description: limited L LE due to pain and new amputation Motor  Motor Motor: Other (comment) Motor - Skilled Clinical Observations: Generalized weakness   Trunk/Postural Assessment  Cervical Assessment Cervical Assessment: Within Functional Limits Thoracic Assessment Thoracic Assessment: Exceptions to [facility] (rounded shoulders) Lumbar Assessment Lumbar Assessment: Exceptions to [facility] (posterior pelvic tilt) Postural Control Postural Control: Deficits on evaluation Protective Responses: delayed  Balance Balance Balance Assessed: Yes Static Sitting Balance Static Sitting - Balance Support: No upper extremity supported;Feet supported Static Sitting - Level of Assistance: 5: Stand by assistance Dynamic Sitting Balance Dynamic Sitting - Balance Support: Feet supported Dynamic Sitting - Level of Assistance: 5: Stand by assistance Dynamic Sitting - Balance Activities: Forward lean/weight shifting Sitting balance - Comments: putting on shirt Static Standing Balance Static Standing - Balance Support: Bilateral upper extremity supported Static Standing - Level of Assistance: 4: Min assist Dynamic Standing Balance Dynamic Standing - Balance Support: Left upper extremity supported;Right upper extremity supported;During functional activity Dynamic Standing - Level of Assistance: 4: Min assist Dynamic Standing - Comments: pulling up pants Extremity Assessment      RLE Assessment RLE Assessment:  Exceptions to [facility] Active Range of Motion (AROM) Comments: ankle DF to neutral tight heel cords, otherwise [facility] General Strength Comments: Hip flexion 3+/5, knee extension 4+/5 flexion 4-/5, ankle DF4 -/5; noted ulcerations under great toe and medial aspect of second toe LLE Assessment LLE Assessment: Exceptions to [facility] Active Range of Motion (AROM) Comments: limited knee flexion about 80 General Strength Comments: NT due to pain, new amputation, has wound vac and shrinker and limb guard on her leg  Care Tool Care Tool Bed Mobility Roll left and right activity   Roll left and right assist level: Supervision/Verbal cueing    Sit to lying activity   Sit to lying assist level: Supervision/Verbal cueing    Lying to sitting edge of bed activity   Lying to sitting edge of bed assist level: Supervision/Verbal cueing     Care Tool Transfers Sit to stand transfer   Sit to stand assist level: Minimal Assistance - Patient > 75% Sit to stand assistive device: Walker  Chair/bed transfer   Chair/bed transfer assist level: Minimal Assistance - Patient > 75%     Psychologist, clinical transfer assist level: Minimal Assistance - Patient > 75%      Care Tool Locomotion Ambulation   Assist level: Moderate Assistance - Patient 50 - 74% Assistive device: Walker-rolling Max distance: 18'  Walk 10 feet activity   Assist level: Moderate Assistance - Patient - 50 - 74% Assistive device: Walker-rolling   Walk 50 feet with 2 turns activity Walk 50 feet with 2 turns activity did not occur: Safety/medical concerns      Walk 150 feet activity Walk 150 feet activity did not occur: Safety/medical concerns      Walk 10 feet on uneven surfaces activity Walk 10 feet on uneven surfaces activity did not occur: Safety/medical concerns      Stairs Stair activity did not occur: Safety/medical concerns        Walk up/down 1 step activity Walk up/down 1 step or curb (drop down) activity  did not occur: Safety/medical concerns     Walk up/down 4 steps activity did not occuR: Safety/medical concerns  Walk up/down 4 steps activity      Walk up/down 12 steps activity Walk up/down 12 steps activity did not occur: Safety/medical concerns      Pick up small objects from floor Pick up small object from the floor (from standing position) activity did not occur: Safety/medical concerns      Wheelchair Will patient use wheelchair at discharge?: Yes Type of Wheelchair: Manual   Wheelchair assist level: Minimal Assistance - Patient > 75% Max wheelchair distance: 26'  Wheel 50 feet with 2 turns activity   Assist Level: Minimal Assistance - Patient > 75%  Wheel 150 feet activity Wheelchair 150 feet activity did not occur: Safety/medical concerns      Refer to Care Plan for Long Term Goals  SHORT TERM GOAL WEEK 1 PT Short Term Goal 1 (Week 1): Patient will perform squat pivot transfers with S PT Short Term Goal 2 (Week 1): Patient will ambulated 73' with CGA with RW PT Short Term Goal 3 (Week 1): Patient will perform w/c mobility S over level indoor surfaces 150' PT Short Term Goal 4 (Week 1): Patient to perform standing balance activity x 2 minutes prior to needing seated rest.  Recommendations for other services: Neuropsych  Skilled Therapeutic Intervention Patient in supine and reports minimal pain as had medication earlier.  Still wearing limb guard and questioning if needs to keep it on.  Educated on wearing for protection during mobility.  Patient supine to sit with S.  Donned paper scrubs seated with min A and for sit to stand to RW to pull up.  Patient seated with S to don shirt.  Donned limb guard and pt transferred to w/c with cues for squat pivot and min A.  Propelled w/c about 34'  with min A to S prior to fatigue.  Assisted to ortho gym.  Patient performed sit to stand to RW min A and ambulated x 18' with w/c follow and encouragement with min A.  Performed transfer to  simulated minivan height with min A sit to stand and step with RW.  Patient c/o nausea so provided cool cloth.  Assisted to room and pt performed squat pivot to bed with min to CGA.  Sit to supine with S.  Left with call bell and needs in reach and bed alarm set.  Mobility Bed Mobility Bed Mobility: Supine to Sit;Sit to Supine;Sitting - Scoot to Edge of Bed Supine to Sit: Supervision/Verbal cueing Sitting - Scoot to Edge of Bed: Supervision/Verbal cueing Sit to Supine: Supervision/Verbal cueing Transfers Transfers: Sit to Stand;Stand to Sit;Squat Pivot Transfers;Stand Pivot Transfers Sit to Stand: Minimal Assistance - Patient > 75% Stand to Sit: Minimal Assistance - Patient > 75% Stand Pivot Transfers: Minimal Assistance - Patient > 75% Stand Pivot Transfer Details (indicate cue type and reason): assist for balance, safety, walker management Squat Pivot Transfers: Minimal Assistance - Patient > 75% Transfer (Assistive device): Rolling walker Locomotion  Gait Ambulation: Yes Gait Assistance: Moderate Assistance - Patient 50-74% Gait Distance (Feet): 18 Feet Assistive device: Rolling walker Gait Assistance Details: Verbal cues for precautions/safety;Verbal cues for safe use of DME/AE Gait Assistance Details: wheelchair close in case pt fatigued, encouraged to continue for distance, but pt c/o nausea after, assist for balance, cues for proximity to walker Gait Gait: Yes Gait Pattern: Impaired Gait Pattern: Step-to pattern;Decreased stride length;Poor foot clearance - right Stairs / Additional Locomotion Stairs: No Wheelchair Mobility Wheelchair Mobility: Yes Wheelchair Assistance: Minimal assistance - Patient >75% Wheelchair Propulsion: Both upper extremities  Wheelchair Parts Management: Needs assistance Distance: 68'   Discharge Criteria: Patient will be discharged from PT if patient refuses treatment 3 consecutive times without medical reason, if treatment goals not met, if there  is a change in medical status, if patient makes no progress towards goals or if patient is discharged from hospital.  The above assessment, treatment plan, treatment alternatives and goals were discussed and mutually agreed upon: by patient  Jamison Oka, PT 07/02/2021, 12:24 PM

## 2021-07-02 NOTE — Progress Notes (Signed)
Inpatient Rehabilitation Care Coordinator Assessment and Plan Patient Details  Name: Sarah Long MRN: [id number] Date of Birth: 02-24-1971  Today's Date: 07/02/2021  Hospital Problems: Active Problems:   Left below-knee amputee [facility])  Past Medical History:  Past Medical History:  Diagnosis Date   Abnormal Pap smear 1998   Abnormal vaginal bleeding 12/19/2011   Acute urinary retention 06/26/2021   Bacterial infection    Bipolar 1 disorder ([place])    Blister of second toe of left foot 11/21/2016   Candida vaginitis 07/2007   Depression    recently added wellbutrin-has not taken yet for bipolar   Diabetes in pregnancy    Diabetes mellitus    nph 20U qam and qpm, regular with meals   Diabetic ketoacidosis without coma associated with type 2 diabetes mellitus ([place])    Fibroid    Galactorrhea of right breast 2008   H/O amenorrhea 07/2007   H/O dizziness 10/14/2011   H/O dysmenorrhea 2010   H/O menorrhagia 10/14/2011   H/O varicella    Headache(784.0)    Heavy vaginal bleeding due to contraceptive injection use 10/12/2011   Depo Provera   Herpes    HSV-2 infection 01/03/2009   Hx: UTI (urinary tract infection) 2009   Hypertension    on aldomet   Increased BMI 2010   Irregular uterine bleeding 04/04/2012   Pt has mirena    Obesity 10/14/2011   Oligomenorrhea 07/2007   Pelvic pain in female    Presence of permanent cardiac pacemaker    Preterm labor    Trichomonas    Yeast infection    Past Surgical History:  Past Surgical History:  Procedure Laterality Date   ABDOMINAL AORTOGRAM W/LOWER EXTREMITY N/A 06/17/2021   Procedure: ABDOMINAL AORTOGRAM W/LOWER EXTREMITY;  Surgeon: Wellington Hampshire, MD;  Location: [place] [facility];  Service: Cardiovascular;  Laterality: N/A;   AMPUTATION Left 06/27/2021   Procedure: AMPUTATION BELOW KNEE;  Surgeon: Newt Minion, MD;  Location: [place];  Service: Orthopedics;  Laterality: Left;   CESAREAN SECTION  1991   LEFT HEART CATH AND  CORONARY ANGIOGRAPHY N/A 02/10/2020   Procedure: LEFT HEART CATH AND CORONARY ANGIOGRAPHY;  Surgeon: Troy Sine, MD;  Location: [place] [facility];  Service: Cardiovascular;  Laterality: N/A;   PACEMAKER IMPLANT N/A 02/13/2020   Procedure: PACEMAKER IMPLANT;  Surgeon: Constance Haw, MD;  Location: [place] [facility];  Service: Cardiovascular;  Laterality: N/A;   TEMPORARY PACEMAKER N/A 02/10/2020   Procedure: TEMPORARY PACEMAKER;  Surgeon: Troy Sine, MD;  Location: [place] [facility];  Service: Cardiovascular;  Laterality: N/A;   Social History:  reports that she has quit smoking. Her smoking use included cigarettes. She has a 10.00 pack-year smoking history. She has never used smokeless tobacco. She reports that she does not drink alcohol and does not use drugs.  Family / Support Systems Marital Status: Separated Patient Roles: Parent, Other (Comment) (employee) Children: Quasha-daughter [phone]-cell [phone] 48 yo staying with [place] and 48 & 48 yo staying with Dad Other Supports: Friend Anticipated Caregiver: Quasha Ability/Limitations of Caregiver: Does work but can be there after if needed Pt needs to be as independent as possible before DC Caregiver Availability: Intermittent Family Dynamics: Close with all of her children her oldest is helpful and will try to assist, but works and is taking care of pt's 48 yo  at this time  Social History Preferred language: English Religion: Christian Cultural Background: No issues Education: HS Read: Yes  Write: Yes Employment Status: Employed Name of Employer: [employer]-extended stay Return to Work Plans: Plans to 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 She has always been independent and takne care of herself and her children and she is concerned about this now. Recent Psychosocial Issues: other health issues Psychiatric History: History of bipolar and depression see's someone at [facility] and is on medication she feels with her condition and loss of leg she may get worse and need to see someone here. Would benefit from neuro-psych while here. Substance Abuse History: History of ETOH reports not an issue now. No other issues according to pt  Patient / Family Perceptions, Expectations & Goals Pt/Family understanding of illness & functional limitations: Pt is able to explain her amputaiton and wound on her other leg. She is hoping her right leg will heal and is aware how important it is to take care of herself now. She does talk with the MD and feels she has an understanding of her treatment plan going forward. Premorbid pt/family roles/activities: Mom, employee, friend, etc Anticipated changes in roles/activities/participation: resume Pt/family expectations/goals: Pt states: " I need to be able to take care of myself when I leave here."  Pt is tearful at times and concerned about her condition  [employer]: Other (Comment) ([place] patient followed there) Premorbid Home Care/DME Agencies: None Transportation available at discharge: self daughter does Brewing technologist referrals recommended: Neuropsychology  Discharge Planning Living Arrangements: Children Support Systems: Children, Friends/neighbors Type of Residence: Other (Comment) (Extended [employer]) Insurance Resources: [employer] (specify county) Pensions consultant: Employment Museum/gallery curator Screen Referred: Yes Living  Expenses: Education officer, community Management: Patient Does the patient have any problems obtaining your medications?: Yes (Describe) (some co-pays too high for her) Home Management: self and housekeeping Patient/Family Preliminary Plans: Return to extended stay where she works. Her two youngest children are with their father and pt is not sure how long will stay with him. She is concerned she may lose her kids due to her health issues. Aware therapy evaluating and setting goals today. Care Coordinator Barriers to Discharge: Lack of/limited family support, Decreased caregiver support, Insurance for SNF coverage, Medication compliance Care Coordinator Anticipated Follow Up Needs: HH/OP, Support Group  Clinical Impression Pleasant yet tearful patient who is grieving the loss of her leg and not being able to be with her youngest children. She has numerous concerns, which are valid. Will depend on how well she does here if children can return to her or need to stay with their father. Oldest daughter-Quasha is supportive and will assist some, but limited. Will see if can be seen by neuro-psych while here. Await therapy evaluations  Elease Hashimoto 07/02/2021, 10:38 AM

## 2021-07-02 NOTE — Plan of Care (Signed)
  Problem: RH Balance Goal: LTG Patient will maintain dynamic sitting balance (PT) Description: LTG:  Patient will maintain dynamic sitting balance with assistance during mobility activities (PT) Flowsheets (Taken 07/02/2021 1234) LTG: Pt will maintain dynamic sitting balance during mobility activities with:: Independent Goal: LTG Patient will maintain dynamic standing balance (PT) Description: LTG:  Patient will maintain dynamic standing balance with assistance during mobility activities (PT) Flowsheets (Taken 07/02/2021 1234) LTG: Pt will maintain dynamic standing balance during mobility activities with:: Independent with assistive device    Problem: Sit to Stand Goal: LTG:  Patient will perform sit to stand with assistance level (PT) Description: LTG:  Patient will perform sit to stand with assistance level (PT) Flowsheets (Taken 07/02/2021 1234) LTG: PT will perform sit to stand in preparation for functional mobility with assistance level: Independent with assistive device   Problem: RH Bed Mobility Goal: LTG Patient will perform bed mobility with assist (PT) Description: LTG: Patient will perform bed mobility with assistance, with/without cues (PT). Flowsheets (Taken 07/02/2021 1234) LTG: Pt will perform bed mobility with assistance level of: Independent   Problem: RH Bed to Chair Transfers Goal: LTG Patient will perform bed/chair transfers w/assist (PT) Description: LTG: Patient will perform bed to chair transfers with assistance (PT). Flowsheets (Taken 07/02/2021 1234) LTG: Pt will perform Bed to Chair Transfers with assistance level: Independent with assistive device    Problem: RH Car Transfers Goal: LTG Patient will perform car transfers with assist (PT) Description: LTG: Patient will perform car transfers with assistance (PT). Flowsheets (Taken 07/02/2021 1234) LTG: Pt will perform car transfers with assist:: Supervision/Verbal cueing   Problem: RH Ambulation Goal: LTG Patient  will ambulate in controlled environment (PT) Description: LTG: Patient will ambulate in a controlled environment, # of feet with assistance (PT). Flowsheets (Taken 07/02/2021 1234) LTG: Pt will ambulate in controlled environ  assist needed:: Independent with assistive device LTG: Ambulation distance in controlled environment: 19' w/ LRAD Goal: LTG Patient will ambulate in home environment (PT) Description: LTG: Patient will ambulate in home environment, # of feet with assistance (PT). Flowsheets (Taken 07/02/2021 1234) LTG: Pt will ambulate in home environ  assist needed:: Independent with assistive device LTG: Ambulation distance in home environment: 25' w/ LRAD   Problem: RH Wheelchair Mobility Goal: LTG Patient will propel w/c in controlled environment (PT) Description: LTG: Patient will propel wheelchair in controlled environment, # of feet with assist (PT) Flowsheets (Taken 07/02/2021 1234) LTG: Pt will propel w/c in controlled environ  assist needed:: Independent with assistive device LTG: Propel w/c distance in controlled environment: 150' Goal: LTG Patient will propel w/c in home environment (PT) Description: LTG: Patient will propel wheelchair in home environment, # of feet with assistance (PT). Flowsheets (Taken 07/02/2021 1234) LTG: Pt will propel w/c in home environ  assist needed:: Independent with assistive device Distance: wheelchair distance in controlled environment: 150 LTG: Propel w/c distance in home environment: 50'   Problem: RH Stairs Goal: LTG Patient will ambulate up and down stairs w/assist (PT) Description: LTG: Patient will ambulate up and down # of stairs with assistance (PT) Flowsheets (Taken 07/02/2021 1234) LTG: Pt will ambulate up/down stairs assist needed:: Minimal Assistance - Patient > 75% LTG: Pt will  ambulate up and down number of stairs: 4 w/ bilat rails for strength and community access  Magda Kiel, PT

## 2021-07-02 NOTE — Progress Notes (Signed)
Patient ID: Sarah Long, female   DOB: 1971-09-18, 50 y.o.   MRN: [id number] Met with the patient to introduce self and the role of the nurse CM. Reviewed recent surgery and secondary risk including DM, HLD, CKD, smoking history and PVD. Patient with wound vac on left and skin issues on right limb (blister- wound right great toe and diabetic wound 2nd toe). Desitin ordered for buttocks (MASD). Also reviewed dietary modifications with medications for diabetes, low protein and CKD. Continue to follow along to discharge to address educational needs and facilitate preparation for discharge. Margarito Liner

## 2021-07-02 NOTE — Plan of Care (Signed)
  Problem: RH Balance Goal: LTG Patient will maintain dynamic standing with ADLs (OT) Description: LTG:  Patient will maintain dynamic standing balance with assist during activities of daily living (OT)  Flowsheets (Taken 07/02/2021 1442) LTG: Pt will maintain dynamic standing balance during ADLs with: Independent with assistive device   Problem: Sit to Stand Goal: LTG:  Patient will perform sit to stand in prep for activites of daily living with assistance level (OT) Description: LTG:  Patient will perform sit to stand in prep for activites of daily living with assistance level (OT) Flowsheets (Taken 07/02/2021 1442) LTG: PT will perform sit to stand in prep for activites of daily living with assistance level: Independent with assistive device   Problem: RH Grooming Goal: LTG Patient will perform grooming w/assist,cues/equip (OT) Description: LTG: Patient will perform grooming with assist, with/without cues using equipment (OT) Flowsheets (Taken 07/02/2021 1442) LTG: Pt will perform grooming with assistance level of: Independent with assistive device    Problem: RH Bathing Goal: LTG Patient will bathe all body parts with assist levels (OT) Description: LTG: Patient will bathe all body parts with assist levels (OT) Flowsheets (Taken 07/02/2021 1442) LTG: Pt will perform bathing with assistance level/cueing: Independent with assistive device    Problem: RH Dressing Goal: LTG Patient will perform upper body dressing (OT) Description: LTG Patient will perform upper body dressing with assist, with/without cues (OT). Flowsheets (Taken 07/02/2021 1442) LTG: Pt will perform upper body dressing with assistance level of: Independent with assistive device Goal: LTG Patient will perform lower body dressing w/assist (OT) Description: LTG: Patient will perform lower body dressing with assist, with/without cues in positioning using equipment (OT) Flowsheets (Taken 07/02/2021 1442) LTG: Pt will perform  lower body dressing with assistance level of: Independent with assistive device   Problem: RH Toileting Goal: LTG Patient will perform toileting task (3/3 steps) with assistance level (OT) Description: LTG: Patient will perform toileting task (3/3 steps) with assistance level (OT)  Flowsheets (Taken 07/02/2021 1442) LTG: Pt will perform toileting task (3/3 steps) with assistance level: Independent with assistive device   Problem: RH Simple Meal Prep Goal: LTG Patient will perform simple meal prep w/assist (OT) Description: LTG: Patient will perform simple meal prep with assistance, with/without cues (OT). Flowsheets (Taken 07/02/2021 1442) LTG: Pt will perform simple meal prep with assistance level of: Independent with assistive device   Problem: RH Toilet Transfers Goal: LTG Patient will perform toilet transfers w/assist (OT) Description: LTG: Patient will perform toilet transfers with assist, with/without cues using equipment (OT) Flowsheets (Taken 07/02/2021 1442) LTG: Pt will perform toilet transfers with assistance level of: Independent with assistive device   Problem: RH Tub/Shower Transfers Goal: LTG Patient will perform tub/shower transfers w/assist (OT) Description: LTG: Patient will perform tub/shower transfers with assist, with/without cues using equipment (OT) Flowsheets (Taken 07/02/2021 1442) LTG: Pt will perform tub/shower stall transfers with assistance level of: Independent with assistive device

## 2021-07-02 NOTE — Progress Notes (Signed)
PROGRESS NOTE   Subjective/Complaints: Pt reports reasonable night. Pain is under control. Doesn't know what to expect today from therapy  ROS: Patient denies fever, rash, sore throat, blurred vision, nausea, vomiting, diarrhea, cough, shortness of breath or chest pain, headache, or mood change.    Objective:   No results found. Recent Labs    06/30/21 0054 07/02/21 0546  WBC 20.0* 12.5*  HGB 8.2* 7.6*  HCT 26.5* 25.6*  PLT 308 300   Recent Labs    06/30/21 0054 07/02/21 0546  NA 137 135  K 3.6 3.5  CL 108 105  CO2 25 27  GLUCOSE 158* 134*  BUN 30* 21*  CREATININE 1.07* 1.14*  CALCIUM 7.8* 7.6*    Intake/Output Summary (Last 24 hours) at 07/02/2021 1111 Last data filed at 07/02/2021 0900 Gross per 24 hour  Intake 657 ml  Output --  Net 657 ml        Physical Exam: Vital Signs Blood pressure (!) 141/73, pulse 80, temperature 98 F (36.7 C), temperature source Oral, resp. rate 18, weight 89.6 kg, SpO2 100 %.  General: Alert and oriented x 3, No apparent distress HEENT: Head is normocephalic, atraumatic, PERRLA, EOMI, sclera anicteric, oral mucosa pink and moist, dentition intact, ext ear canals clear,  Neck: Supple without JVD or lymphadenopathy Heart: Reg rate and rhythm. No murmurs rubs or gallops Chest: CTA bilaterally without wheezes, rales, or rhonchi; no distress Abdomen: Soft, non-tender, non-distended, bowel sounds positive. Extremities: No clubbing, cyanosis, or edema. Pulses are 2+ Psych: Pt's affect is appropriate. Pt is cooperative Skin: left BK in limb guard with vac in place. Right IJ. Skin extremely dry and cracked right foot/toes. Neuro: Pt is cognitively appropriate with normal insight, memory, and awareness. Cranial nerves 2-12 are intact. Sensory exam is normal. Reflexes are 2+ in all 4's. Fine motor coordination is intact. No tremors. Motor function is grossly 5/5 in UE. Can lift LLE  off bed. RLE 4/5. [name]  Musculoskeletal: Full ROM, No pain with AROM or PROM in the neck, trunk, or extremities. Posture appropriate    Assessment/Plan: 1. Functional deficits which require 3+ hours per day of interdisciplinary therapy in a comprehensive inpatient rehab setting. Physiatrist is providing close team supervision and 24 hour management of active medical problems listed below. Physiatrist and rehab team continue to assess barriers to discharge/monitor patient progress toward functional and medical goals  Care Tool:  Bathing              Bathing assist       Upper Body Dressing/Undressing Upper body dressing        Upper body assist      Lower Body Dressing/Undressing Lower body dressing            Lower body assist       Toileting Toileting    Toileting assist Assist for toileting: Moderate Assistance - Patient 50 - 74%     Transfers Chair/bed transfer  Transfers assist           Locomotion Ambulation   Ambulation assist              Walk 10 feet activity  Assist           Walk 50 feet activity   Assist           Walk 150 feet activity   Assist           Walk 10 feet on uneven surface  activity   Assist           Wheelchair     Assist               Wheelchair 50 feet with 2 turns activity    Assist            Wheelchair 150 feet activity     Assist          Blood pressure (!) 141/73, pulse 80, temperature 98 F (36.7 C), temperature source Oral, resp. rate 18, weight 89.6 kg, SpO2 100 %.  Medical Problem List and Plan: 1.  Debility secondary to gangrenous left foot.  Status post left BKA 06/27/2021.  Wound VAC as directed             -patient may not shower             -ELOS/Goals: 8-10 days mod I  -Patient is beginning CIR therapies today including PT and OT  2.  Antithrombotics: -DVT/anticoagulation: Lovenox             -antiplatelet therapy: N/A 3. Pain  Management: Oxycodone as needed 4. Mood: Melatonin nightly as needed.  Provide emotional support             -antipsychotic agents: Latuda 40 mg daily 5. Neuropsych: This patient is capable of making decisions on her own behalf. 6. Skin/Wound Care: wound vac thru 7/30  -limb guard  -eucerin cream to right foot 7. Fluids/Electrolytes/Nutrition: Routine in and outs with follow-up chemistries 8.  Acute on chronic anemia.  hgb down to 7.6  -no gross bleeding  -f/u again Monday unless source seen 9.  Diabetes mellitus with peripheral neuropathy.  Lantus insulin 40 units daily.     CBG (last 3)  Recent Labs    07/01/21 1634 07/01/21 2110 07/02/21 0627  GLUCAP 152* 152* 131*    10.  Hyperlipidemia.  Crestor 11.  AKI on CKD.  BUN/Cr down to 21/1.14 12.  Obesity.  BMI 34.33.  Dietary follow-up 13.  Hypertension.  Hygroton 25 mg daily.  -controlled 14.  Diastolic congestive heart failure.  Monitor for any signs of fluid overload   Filed Weights   07/01/21 1454  Weight: 89.6 kg    15.  History of pancreatitis.  Continue Creon 16.  History of alcohol tobacco abuse.  Counseling 17. Leukocytosis: wbcs down to 12.5    LOS: 1 days A FACE TO FACE EVALUATION WAS PERFORMED  Meredith Staggers 07/02/2021, 11:11 AM

## 2021-07-02 NOTE — Progress Notes (Signed)
Routine line care: Alerted by pt's nurse that she has triple lumen IJ. Labs drawn, site unremarkable. Noted pt is no longer receiving IV medications. Providers: please consider removal.

## 2021-07-02 NOTE — Progress Notes (Signed)
Inpatient Rehabilitation  Patient information reviewed and entered into eRehab system by Berdena Cisek M. Alesa Echevarria, M.A., CCC/SLP, PPS Coordinator.  Information including medical coding, functional ability and quality indicators will be reviewed and updated through discharge.    

## 2021-07-02 NOTE — Progress Notes (Signed)
[facility] Individual Statement of Services  Patient Name:  Sarah Long  Date:  07/02/2021  Welcome to the [facility].  Our goal is to provide you with an individualized program based on your diagnosis and situation, designed to meet your specific needs.  With this comprehensive rehabilitation program, you will be expected to participate in at least 3 hours of rehabilitation therapies Monday-Friday, with modified therapy programming on the weekends.  Your rehabilitation program will include the following services:  Physical Therapy (PT), Occupational Therapy (OT), 24 hour per day rehabilitation nursing, Therapeutic Recreaction (TR), Neuropsychology, Care Coordinator, Rehabilitation Medicine, Nutrition Services, and Pharmacy Services  Weekly team conferences will be held on Tuesday to discuss your progress.  Your Inpatient Rehabilitation Care Coordinator will talk with you frequently to get your input and to update you on team discussions.  Team conferences with you and your family in attendance may also be held.  Expected length of stay: 10-14 days  Overall anticipated outcome: mod/I wheelchair and ambulation  Depending on your progress and recovery, your program may change. Your Inpatient Rehabilitation Care Coordinator will coordinate services and will keep you informed of any changes. Your Inpatient Rehabilitation Care Coordinator's name and contact numbers are listed  below.  The following services may also be recommended but are not provided by the [facility] will be made to provide these services after discharge if needed.  Arrangements include referral to agencies that provide these services.  Your insurance has been verified to be:  medicaid Your primary doctor is:  Asencion Noble  Pertinent information will be shared with your doctor and your insurance company.  Inpatient Rehabilitation Care Coordinator:  Ovidio Kin, [place] or Emilia Beck  Information discussed with and copy given to patient by: Elease Hashimoto, 07/02/2021, 10:40 AM

## 2021-07-03 DIAGNOSIS — I739 Peripheral vascular disease, unspecified: Secondary | ICD-10-CM | POA: Diagnosis not present

## 2021-07-03 DIAGNOSIS — Z89512 Acquired absence of left leg below knee: Secondary | ICD-10-CM | POA: Diagnosis not present

## 2021-07-03 DIAGNOSIS — I10 Essential (primary) hypertension: Secondary | ICD-10-CM | POA: Diagnosis not present

## 2021-07-03 DIAGNOSIS — E1169 Type 2 diabetes mellitus with other specified complication: Secondary | ICD-10-CM | POA: Diagnosis not present

## 2021-07-03 LAB — GLUCOSE, CAPILLARY
Glucose-Capillary: 152 mg/dL — ABNORMAL HIGH (ref 70–99)
Glucose-Capillary: 128 mg/dL — ABNORMAL HIGH (ref 70–99)
Glucose-Capillary: 214 mg/dL — ABNORMAL HIGH (ref 70–99)
Glucose-Capillary: 114 mg/dL — ABNORMAL HIGH (ref 70–99)

## 2021-07-03 MED ORDER — OXYCODONE HCL 5 MG PO TABS
5.0000 mg | ORAL_TABLET | Freq: Four times a day (QID) | ORAL | Status: DC | PRN
  Administered 2021-07-03 – 2021-07-08 (×9): 5 mg via ORAL
  Filled 2021-07-03 (×10): qty 1

## 2021-07-03 MED ORDER — INSULIN GLARGINE-YFGN 100 UNIT/ML ~~LOC~~ SOLN
42.0000 [IU] | Freq: Every day | SUBCUTANEOUS | Status: DC
  Administered 2021-07-04 – 2021-07-05 (×2): 42 [IU] via SUBCUTANEOUS
  Filled 2021-07-03 (×2): qty 0.42

## 2021-07-03 MED ORDER — METHOCARBAMOL 500 MG PO TABS
500.0000 mg | ORAL_TABLET | Freq: Three times a day (TID) | ORAL | Status: DC | PRN
  Administered 2021-07-03 – 2021-07-05 (×4): 500 mg via ORAL
  Filled 2021-07-03 (×4): qty 1

## 2021-07-03 NOTE — Progress Notes (Signed)
Order to remove CVC, line removed per policy, no complications noted. Dressing intact, patient instructed to remain flat until 1530.

## 2021-07-03 NOTE — Progress Notes (Signed)
Physical Therapy Session Note  Patient Details  Name: Sarah Long MRN: [id number] Date of Birth: 01/02/1971  Today's Date: 07/03/2021 PT Individual Time:Session1: [id number]; Arthor [id number] PT Individual Time Calculation (min): 56 min & 60 min  Short Term Goals: Week 1:  PT Short Term Goal 1 (Week 1): Patient will perform squat pivot transfers with S PT Short Term Goal 2 (Week 1): Patient will ambulated 53' with CGA with RW PT Short Term Goal 3 (Week 1): Patient will perform w/c mobility S over level indoor surfaces 150' PT Short Term Goal 4 (Week 1): Patient to perform standing balance activity x 2 minutes prior to needing seated rest.  Skilled Therapeutic Interventions/Progress Updates:    Session1:  Patient in supine and reports feeling okay.  Patient educated in L BKA therex consisting of quad sets, bilateral bridging over rolled pillow w/ 5 sec hold, hip adductor squeezes with 5 sec hold, SAQ and sidelying hip abduction and extension all x 10.  Re-applied limb guard and pt performed supine to sit with S.  Sit to stand to RW and ambulate in the room x 12' x 2 with min A.  Patient reports fatigue, feeling "spacey" feels may be due to medication.  Patient propelled in w/c x 100' with S, then assisted in w/c to ortho gym due to pt reports UE fatigue.  Demonstrated Nu Step to patient and she transferred via stand pivot with RW and CGA to Nu Step.  Performed 3 minutes @ level 3 with 1 rest break with bilat UE's and R LE.  Min c/o R shoulder pain but improved with shorter length of arm on Nu Step.  Patient performed stand step back to w/c with RW and CGA.  Patient assisted in w/c to room and left seated with alarm belt and call bell/needs in reach.  [place]:  Patient in supine and reports pain okay.  Requesting to wash perineal area due to feels dirty and itchy.  Patient assisted to w/c CGA stand step with RW.  Sit to stand at sink with CGA and pt doffed shorts with min A and washed perineal  area with 1 UE support on sink and CGA.  Patient seated and assist to finish doffing shorts after removal of limb guard and threading tubing from wound vac.  Donned underwear and pants with min A threading tubing and pt sit to stand to pull up underwear and pants min/mod A with 1 UE support on sink.  Patient seated and assisted to don limb guard under pants.  Propelled wheelchair x 60' prior to fatigue.  Assisted to therapy gym.  Patient sit to stand to RW CGA and ambulated with min to CGA x 20' with 1 turn.  Patient seated to rest after c/o dizziness.  Performed sit<>stand x 5 with cues for hand placement.  Patient seated for UE therex including horizontal shoulder abduction, bicep curls and shoulder press forward all x 10 with orange t-band.  Patient propelled toward room about 43' then assisted to room in w/c.  Performed sit to stand and step with RW to bed with CGA.  Patient to supine with S and assist to doff limb guard and position for comfort with call bell and needs in reach and bed alarm active.   Therapy Documentation Precautions:  Precautions Precautions: Fall Precaution Comments: L BKA Required Braces or Orthoses: Other Brace Other Brace: residual limb guard Restrictions Weight Bearing Restrictions: Yes LLE Weight Bearing: Non weight bearing Other Position/Activity Restrictions: no orders related  to RIGHT foot per chart Pain: Pain Assessment Pain Scale: Faces Pain Score: 0-No pain Faces Pain Scale: Hurts even more Pain Type: Surgical pain Pain Location: Leg Pain Orientation: Left Pain Descriptors / Indicators: Spasm;Shooting Pain Onset: With Activity Pain Intervention(s): Rest    Therapy/Group: Individual Therapy  Reginia Naas Streetman, PT 07/03/2021, 12:08 PM

## 2021-07-03 NOTE — IPOC Note (Signed)
Overall Plan of Care [facility]) Patient Details Name: Sarah Long MRN: [id number] DOB: Apr 17, 1971  Admitting Diagnosis: Left below-knee amputee [facility])  Hospital Problems: Principal Problem:   Left below-knee amputee [facility])     Functional Problem List: Nursing Bowel, Medication Management, Safety, Pain, Endurance  PT Balance, Pain, Endurance, Motor, Sensory, Safety  OT Balance, Cognition, Edema, Endurance, Motor, Pain, Safety, Sensory, Skin Integrity  SLP    TR         Basic ADL's: OT Grooming, Bathing, Dressing, Toileting     Advanced  ADL's: OT Simple Meal Preparation     Transfers: PT Bed Mobility, Bed to Chair, Car, Manufacturing systems engineer, Metallurgist: PT Ambulation, Emergency planning/management officer, Stairs     Additional Impairments: OT None  SLP        TR      Anticipated Outcomes Item Anticipated Outcome  Self Feeding no goal  Swallowing      Basic self-care  Mod I  Toileting  Mod I   Bathroom Transfers Mod I  Bowel/Bladder  manage bowel w mod I assist  Transfers  mod I  Locomotion  mod I with w/c versus walker  Communication     Cognition     Pain  at or below level 4  Safety/Judgment  maintain safety with cues/reminders   Therapy Plan: PT Intensity: Minimum of 1-2 x/day ,45 to 90 minutes PT Frequency: 5 out of 7 days PT Duration Estimated Length of Stay: 10-14 days OT Intensity: Minimum of 1-2 x/day, 45 to 90 minutes OT Frequency: 5 out of 7 days OT Duration/Estimated Length of Stay: 10-14 days     Due to the current state of emergency, patients may not be receiving their 3-hours of Medicare-mandated therapy.   Team Interventions: Nursing Interventions Disease Management/Prevention, Medication Management, Discharge Planning, Pain Management, Bowel Management, Patient/Family Education, Skin Care/Wound Management  PT interventions Ambulation/gait training, DME/adaptive equipment instruction, Training and development officer, Community  reintegration, Disease management/prevention, Neuromuscular re-education, Patient/family education, Skin care/wound management, Stair training, Therapeutic Exercise, UE/LE Coordination activities, Wheelchair propulsion/positioning, UE/LE Strength taining/ROM, Therapeutic Activities, Splinting/orthotics, Psychosocial support, Pain management, Functional mobility training, Discharge planning  OT Interventions Balance/vestibular training, Discharge planning, Pain management, Self Care/advanced ADL retraining, Therapeutic Activities, UE/LE Coordination activities, Visual/perceptual remediation/compensation, Therapeutic Exercise, Skin care/wound managment, Patient/family education, Functional mobility training, Disease mangement/prevention, Cognitive remediation/compensation, Academic librarian, Engineer, drilling, Neuromuscular re-education, Psychosocial support, UE/LE Strength taining/ROM, Wheelchair propulsion/positioning, Splinting/orthotics  SLP Interventions    TR Interventions    SW/CM Interventions Discharge Planning, Psychosocial Support, Patient/Family Education   Barriers to Discharge MD  Medical stability  Nursing Decreased caregiver support, Wound Care 1 level room w children  PT Decreased caregiver support, Weight bearing restrictions family can assist, but not stay with pt  OT Decreased caregiver support    SLP      SW Lack of/limited family support, Decreased caregiver support, Insurance underwriter for SNF coverage, Medication compliance     Team Discharge Planning: Destination: PT-Home ,OT-   , SLP-  Projected Follow-up: PT-Home health PT, OT-  None, SLP-  Projected Equipment Needs: PT-Rolling walker with 5" wheels, Wheelchair (measurements), Wheelchair cushion (measurements), 3 in 1 bedside comode, OT- To be determined, Tub/shower bench, SLP-  Equipment Details: PT-18x18" w/c with amputee support and standard RW, OT-  Patient/family involved in discharge planning: PT-  Patient,  OT-Patient, SLP-   MD ELOS: 10-14 days Medical Rehab Prognosis:  Excellent Assessment: The patient has been admitted for CIR therapies with the  diagnosis of left BKA. The team will be addressing functional mobility, strength, stamina, balance, safety, adaptive techniques and equipment, self-care, bowel and bladder mgt, patient and caregiver education, prosthetic ed, pain mgt. Goals have been set at [facility] for PT and OT.   Due to the current state of emergency, patients may not be receiving their 3 hours per day of Medicare-mandated therapy.    Meredith Staggers, MD, FAAPMR     See Team Conference Notes for weekly updates to the plan of care

## 2021-07-03 NOTE — Progress Notes (Signed)
Physical Therapy Session Note  Patient Details  Name: Sarah Long MRN: [id number] Date of Birth: 10/31/1971  Today's Date: 07/03/2021 PT Individual Time: 1030-1100 PT Individual Time Calculation (min): 30 min   Short Term Goals: Week 1:  PT Short Term Goal 1 (Week 1): Patient will perform squat pivot transfers with S PT Short Term Goal 2 (Week 1): Patient will ambulated 53' with CGA with RW PT Short Term Goal 3 (Week 1): Patient will perform w/c mobility S over level indoor surfaces 150' PT Short Term Goal 4 (Week 1): Patient to perform standing balance activity x 2 minutes prior to needing seated rest.  Skilled Therapeutic Interventions/Progress Updates:     Pt received sitting in w/c and agreeable to therapy - reports no pain. Propelled herself in w/c with supervision 49f + 771f(rest break 2/2 UE fatigue) on level ground to ortho rehab gym. Spent some time providing education regarding limb loss, positioning for BKA, and purpose of shrinker/limb guard. Pt completed stand<>pivot transfer with CGA from w/c to mat table with no AD. Worked on LLE AROM with LAQ and hip marches ([id number]). Next, worked on repeated sit<>stands to RW [place] CGA from low mat surface height - needing cues for proper hand placement (tends to place both BUE to RW). Also worked on static standing balance without UE support by hovering hands over RW frame - requires minA guard without UE support and CGA with UE support. Reapplied limbgaurd with totalA in sitting while educating her on placement and positioning. Completed squat<>pivot transfer with minA back to her w/c and assisted back to her room for time in w/c. Remained seated with all needs in reach, safety belt alarm on, pt made comfortable.   Therapy Documentation Precautions:  Precautions Precautions: Fall Precaution Comments: L BKA Required Braces or Orthoses: Other Brace Other Brace: residual limb guard Restrictions Weight Bearing Restrictions: Yes LLE  Weight Bearing: Non weight bearing Other Position/Activity Restrictions: no orders related to RIGHT foot per chart General:     Therapy/Group: Individual Therapy  ChrAlger Simons29/2022, 7:40 AM

## 2021-07-03 NOTE — Progress Notes (Signed)
Plan is to continue doxycycline 100 mg twice daily for 30 days total await plan for revascularization of right toe in 4 weeks per orthopedic services.

## 2021-07-03 NOTE — Progress Notes (Addendum)
PROGRESS NOTE   Subjective/Complaints: Feels doped up from her pain medications. Otherwise doing fairly well. Still has pain at incision and phantom limb pain  ROS: Patient denies fever, rash, sore throat, blurred vision, nausea, vomiting, diarrhea, cough, shortness of breath or chest pain,   headache, or mood change.    Objective:   No results found. Recent Labs    07/02/21 0546  WBC 12.5*  HGB 7.6*  HCT 25.6*  PLT 300   Recent Labs    07/02/21 0546  NA 135  K 3.5  CL 105  CO2 27  GLUCOSE 134*  BUN 21*  CREATININE 1.14*  CALCIUM 7.6*    Intake/Output Summary (Last 24 hours) at 07/03/2021 1212 Last data filed at 07/03/2021 0840 Gross per 24 hour  Intake 570 ml  Output 0 ml  Net 570 ml        Physical Exam: Vital Signs Blood pressure 119/74, pulse 79, temperature 97.8 F (36.6 C), temperature source Oral, resp. rate 18, weight 89.6 kg, SpO2 100 %.  Constitutional: No distress . Vital signs reviewed. obese HEENT: EOMI, oral membranes moist Neck: supple Cardiovascular: RRR without murmur. No JVD    Respiratory/Chest: CTA Bilaterally without wheezes or rales. Normal effort    GI/Abdomen: BS +, non-tender, non-distended Ext: no clubbing, cyanosis, or edema Psych: pleasant and cooperative.   Skin: left BK in limb guard with vac in place. Right IJ. Skin remains extremely dry and cracked right foot/toes. Neuro: Pt is cognitively appropriate with normal insight, memory, and awareness. Seems fairly lucent to me. Cranial nerves 2-12 are intact. Sensory exam is normal. Reflexes are 2+ in all 4's. Fine motor coordination is intact. No tremors. Motor function is grossly 5/5 in UE. Can lift LLE off bed. RLE 4/5. [name]  Musculoskeletal: Full ROM, No pain with AROM or PROM in the neck, trunk, or extremities. Posture appropriate    Assessment/Plan: 1. Functional deficits which require 3+ hours per day of interdisciplinary  therapy in a comprehensive inpatient rehab setting. Physiatrist is providing close team supervision and 24 hour management of active medical problems listed below. Physiatrist and rehab team continue to assess barriers to discharge/monitor patient progress toward functional and medical goals  Care Tool:  Bathing    Body parts bathed by patient: Right arm, Left arm, Chest, Abdomen, Front perineal area, Buttocks, Right upper leg, Left upper leg, Right lower leg, Face   Body parts bathed by helper: Right lower leg Body parts n/a: Left lower leg   Bathing assist Assist Level: Minimal Assistance - Patient > 75%     Upper Body Dressing/Undressing Upper body dressing   What is the patient wearing?: Pull over shirt    Upper body assist Assist Level: Set up assist    Lower Body Dressing/Undressing Lower body dressing      What is the patient wearing?: Pants     Lower body assist Assist for lower body dressing: Moderate Assistance - Patient 50 - 74%     Toileting Toileting    Toileting assist Assist for toileting: Moderate Assistance - Patient 50 - 74%     Transfers Chair/bed transfer  Transfers assist  Chair/bed transfer assist level: Minimal Assistance - Patient > 75%     Locomotion Ambulation   Ambulation assist      Assist level: Moderate Assistance - Patient 50 - 74% Assistive device: Walker-rolling Max distance: 18'   Walk 10 feet activity   Assist     Assist level: Moderate Assistance - Patient - 50 - 74% Assistive device: Walker-rolling   Walk 50 feet activity   Assist Walk 50 feet with 2 turns activity did not occur: Safety/medical concerns         Walk 150 feet activity   Assist Walk 150 feet activity did not occur: Safety/medical concerns         Walk 10 feet on uneven surface  activity   Assist Walk 10 feet on uneven surfaces activity did not occur: Safety/medical concerns         Wheelchair     Assist Will  patient use wheelchair at discharge?: Yes Type of Wheelchair: Manual    Wheelchair assist level: Supervision/Verbal cueing Max wheelchair distance: 140'    Wheelchair 50 feet with 2 turns activity    Assist        Assist Level: Supervision/Verbal cueing   Wheelchair 150 feet activity     Assist  Wheelchair 150 feet activity did not occur: Safety/medical concerns       Blood pressure 119/74, pulse 79, temperature 97.8 F (36.6 C), temperature source Oral, resp. rate 18, weight 89.6 kg, SpO2 100 %.  Medical Problem List and Plan: 1.  Debility secondary to gangrenous left foot.  Status post left BKA 06/27/2021.  Wound VAC as directed             -patient may not shower             -ELOS/Goals: 8-10 days mod I  -Continue CIR therapies including PT, OT  2.  Antithrombotics: -DVT/anticoagulation: Lovenox             -antiplatelet therapy: N/A 3. Pain Management: Oxycodone as needed  7/29 reduce oxy to '5mg'$  q6 prn d/t complaints of AMS  -utilize tylenol, muscle relaxant 4. Mood: Melatonin nightly as needed.  Provide emotional support             -antipsychotic agents: Latuda 40 mg daily 5. Neuropsych: This patient is capable of making decisions on her own behalf. 6. Skin/Wound Care: wound vac thru 7/30  -limb guard  -eucerin cream to right foot 7. Fluids/Electrolytes/Nutrition: Routine in and outs with follow-up chemistries 8.  Acute on chronic anemia.  hgb down to 7.6 7/28  -no gross bleeding still on exam  -f/u again Monday unless source seen or she's symptomatic 9.  Diabetes mellitus with peripheral neuropathy.  Lantus insulin 40 units daily.     CBG (last 3)  Recent Labs    07/02/21 2111 07/03/21 0633 07/03/21 1121  GLUCAP 184* 152* 128*    -7/29 increase insulin (semglee) to 42 u daily 10.  Hyperlipidemia.  Crestor 11.  AKI on CKD.  BUN/Cr down to 21/1.14---f/u Monday  12.  Obesity.  BMI 34.33.  Dietary follow-up 13.  Hypertension.  Hygroton 25 mg  daily.  -controlled 14.  Diastolic congestive heart failure.  Monitor for any signs of fluid overload  Need weights Filed Weights   07/01/21 1454  Weight: 89.6 kg    15.  History of pancreatitis.  Continue Creon 16.  History of alcohol tobacco abuse.  Counseling 17. Leukocytosis: wbcs down to 12.5    LOS:  2 days A FACE TO FACE EVALUATION WAS PERFORMED  Meredith Staggers 07/03/2021, 12:12 PM

## 2021-07-03 NOTE — Progress Notes (Signed)
Occupational Therapy Session Note  Patient Details  Name: Sarah Long MRN: [id number] Date of Birth: Jun 21, 1971  Today's Date: 07/03/2021 OT Individual Time: [id number] OT Individual Time Calculation (min): 53 min    Short Term Goals: Week 1:  OT Short Term Goal 1 (Week 1): Pt will perform LB bathing with use of LHS with CGA OT Short Term Goal 2 (Week 1): Pt will perform LB dressing with AE PRN with CGA OT Short Term Goal 3 (Week 1): Pt will complete ADL/activity of choice for 10 mins without rest break OT Short Term Goal 4 (Week 1): Pt will perform BSC/toilet transfer with CGA/close supervision and LRAD  Skilled Therapeutic Interventions/Progress Updates:    Treatment session with focus on self-care retraining, functional transfers, dynamic standing balance, and BUE strengthening.  Pt received supine in bed asleep, requiring increased time and encouragement to engage in therapy session.  Pt reports not sleeping well overnight due to overwhelmed by current situation and status and requesting therapy sessions start later in the morning.  Therapist explained typical 3 hr of therapy requirement and encouraging pt to allow time to have them spaced out.  Pt reporting understanding, still requesting therapy to begin after 9.  Pt completed bed mobility supervision and completed stand pivot transfer bed > w/c with RW with min assist.  Pt positioned at sink to complete grooming tasks.  Pt then requesting to eat breakfast prior to continuing on with therapy.  Therapist asking clarifying questions about pt pain and difficulty with sleeping while pt completed breakfast.  Pt then reports need to toilet.  Completed stand pivot transfer w/c > BSC over toilet with use of grab bars min assist.  Pt able to pull pants down while standing with min assist and completed hygiene while standing with Min- CGA.  Therapist assisted with pulling pants over hips while pt maintained standing with CGA.  Therapist educated pt on  importance of BUE strengthening as increased demand with w/c and RW.  Pt engaged in 1 set of 10 chest presses, PNF pattern diagonals, and alternating punches with theraband.  Pt reports difficulty with RUE, therefore educated on body weight activity and progressing to resistance as tolerated.  Pt requested to return to bed.  Completed stand pivot transfer with RW min assist.  Pt left semi-reclined in bed with all needs in reach.   Therapy Documentation Precautions:  Precautions Precautions: Fall Precaution Comments: L BKA Required Braces or Orthoses: Other Brace Other Brace: residual limb guard Restrictions Weight Bearing Restrictions: Yes LLE Weight Bearing: Non weight bearing Other Position/Activity Restrictions: no orders related to RIGHT foot per chart  Pain: Pt with c/o pain 5/10 in residual limb.  RN notified.   Therapy/Group: Individual Therapy  Simonne Come 07/03/2021, 8:41 AM

## 2021-07-04 LAB — GLUCOSE, CAPILLARY
Glucose-Capillary: 96 mg/dL (ref 70–99)
Glucose-Capillary: 99 mg/dL (ref 70–99)
Glucose-Capillary: 209 mg/dL — ABNORMAL HIGH (ref 70–99)

## 2021-07-04 NOTE — Progress Notes (Signed)
PROGRESS NOTE   Subjective/Complaints: No complaints  ROS: Patient denies fever, rash, sore throat, blurred vision, nausea, vomiting, diarrhea, cough, shortness of breath or chest pain,   headache, or mood change.    Objective:   No results found. Recent Labs    07/02/21 0546  WBC 12.5*  HGB 7.6*  HCT 25.6*  PLT 300   Recent Labs    07/02/21 0546  NA 135  K 3.5  CL 105  CO2 27  GLUCOSE 134*  BUN 21*  CREATININE 1.14*  CALCIUM 7.6*    Intake/Output Summary (Last 24 hours) at 07/04/2021 1633 Last data filed at 07/04/2021 1318 Gross per 24 hour  Intake 600 ml  Output 0 ml  Net 600 ml        Physical Exam: Vital Signs Blood pressure 125/75, pulse 79, temperature 98 F (36.7 C), temperature source Oral, resp. rate 18, weight 89.6 kg, SpO2 100 %.  Gen: no distress, normal appearing HEENT: oral mucosa pink and moist, NCAT Cardio: Reg rate Chest: normal effort, normal rate of breathing Abd: soft, non-distended Ext: no edema Psych: pleasant, normal affect  Skin: left BK in limb guard with vac in place. Right IJ. Skin remains extremely dry and cracked right foot/toes. Neuro: Pt is cognitively appropriate with normal insight, memory, and awareness. Seems fairly lucent to me. Cranial nerves 2-12 are intact. Sensory exam is normal. Reflexes are 2+ in all 4's. Fine motor coordination is intact. No tremors. Motor function is grossly 5/5 in UE. Can lift LLE off bed. RLE 4/5. [name]  Musculoskeletal: Full ROM, No pain with AROM or PROM in the neck, trunk, or extremities. Posture appropriate    Assessment/Plan: 1. Functional deficits which require 3+ hours per day of interdisciplinary therapy in a comprehensive inpatient rehab setting. Physiatrist is providing close team supervision and 24 hour management of active medical problems listed below. Physiatrist and rehab team continue to assess barriers to discharge/monitor  patient progress toward functional and medical goals  Care Tool:  Bathing    Body parts bathed by patient: Right arm, Left arm, Chest, Abdomen, Front perineal area, Buttocks, Right upper leg, Left upper leg, Right lower leg, Face   Body parts bathed by helper: Right lower leg Body parts n/a: Left lower leg   Bathing assist Assist Level: Minimal Assistance - Patient > 75%     Upper Body Dressing/Undressing Upper body dressing   What is the patient wearing?: Pull over shirt    Upper body assist Assist Level: Set up assist    Lower Body Dressing/Undressing Lower body dressing      What is the patient wearing?: Pants     Lower body assist Assist for lower body dressing: Moderate Assistance - Patient 50 - 74%     Toileting Toileting    Toileting assist Assist for toileting: Moderate Assistance - Patient 50 - 74%     Transfers Chair/bed transfer  Transfers assist     Chair/bed transfer assist level: Minimal Assistance - Patient > 75%     Locomotion Ambulation   Ambulation assist      Assist level: Moderate Assistance - Patient 50 - 74% Assistive device: Walker-rolling Max  distance: 84'   Walk 10 feet activity   Assist     Assist level: Moderate Assistance - Patient - 50 - 74% Assistive device: Walker-rolling   Walk 50 feet activity   Assist Walk 50 feet with 2 turns activity did not occur: Safety/medical concerns         Walk 150 feet activity   Assist Walk 150 feet activity did not occur: Safety/medical concerns         Walk 10 feet on uneven surface  activity   Assist Walk 10 feet on uneven surfaces activity did not occur: Safety/medical concerns         Wheelchair     Assist Will patient use wheelchair at discharge?: Yes Type of Wheelchair: Manual    Wheelchair assist level: Supervision/Verbal cueing Max wheelchair distance: 140'    Wheelchair 50 feet with 2 turns activity    Assist        Assist Level:  Supervision/Verbal cueing   Wheelchair 150 feet activity     Assist  Wheelchair 150 feet activity did not occur: Safety/medical concerns       Blood pressure 125/75, pulse 79, temperature 98 F (36.7 C), temperature source Oral, resp. rate 18, weight 89.6 kg, SpO2 100 %.  Medical Problem List and Plan: 1.  Debility secondary to gangrenous left foot.  Status post left BKA 06/27/2021.  Wound VAC as directed             -patient may not shower             -ELOS/Goals: 8-10 days mod I  -Continue CIR therapies including PT, OT  2.  Antithrombotics: -DVT/anticoagulation: Lovenox             -antiplatelet therapy: N/A 3. Pain Management: Oxycodone as needed  7/29 reduce oxy to '5mg'$  q6 prn d/t complaints of AMS  -utilize tylenol, muscle relaxant 4. Mood: Melatonin nightly as needed.  Provide emotional support             -antipsychotic agents: Latuda 40 mg daily 5. Neuropsych: This patient is capable of making decisions on her own behalf. 6. Skin/Wound Care: wound vac thru 7/30  -limb guard  -eucerin cream to right foot 7. Fluids/Electrolytes/Nutrition: Routine in and outs with follow-up chemistries 8.  Acute on chronic anemia.  hgb down to 7.6 7/28  -no gross bleeding still on exam  -f/u again Monday unless source seen or she's symptomatic 9.  Diabetes mellitus with peripheral neuropathy.  Lantus insulin 40 units daily.     CBG (last 3)  Recent Labs    07/03/21 2104 07/04/21 0634 07/04/21 1135  GLUCAP 114* 96 99    -7/29 increase insulin (semglee) to 42 u daily  7/30: excellent control 96-114: decrease CBG checks to [facility].  10.  Hyperlipidemia.  Crestor 11.  AKI on CKD.  BUN/Cr down to 21/1.14---f/u Monday  12.  Obesity.  BMI 34.33.  Dietary follow-up 13.  Hypertension.  Continue Hygroton 25 mg daily. 14.  Diastolic congestive heart failure.  Monitor for any signs of fluid overload  Need weights Filed Weights   07/01/21 1454  Weight: 89.6 kg    15.  History of  pancreatitis.  Continue Creon 16.  History of alcohol tobacco abuse.  Counseling 17. Leukocytosis: wbcs down to 12.5    LOS: 3 days A FACE TO FACE EVALUATION WAS PERFORMED  Izora Ribas 07/04/2021, 4:33 PM

## 2021-07-05 LAB — GLUCOSE, CAPILLARY
Glucose-Capillary: 141 mg/dL — ABNORMAL HIGH (ref 70–99)
Glucose-Capillary: 150 mg/dL — ABNORMAL HIGH (ref 70–99)
Glucose-Capillary: 201 mg/dL — ABNORMAL HIGH (ref 70–99)
Glucose-Capillary: 251 mg/dL — ABNORMAL HIGH (ref 70–99)

## 2021-07-05 MED ORDER — METHOCARBAMOL 500 MG PO TABS
250.0000 mg | ORAL_TABLET | Freq: Three times a day (TID) | ORAL | Status: DC | PRN
  Administered 2021-07-05 – 2021-07-07 (×2): 250 mg via ORAL
  Filled 2021-07-05 (×3): qty 1

## 2021-07-05 MED ORDER — INSULIN GLARGINE-YFGN 100 UNIT/ML ~~LOC~~ SOLN
43.0000 [IU] | Freq: Every day | SUBCUTANEOUS | Status: DC
Start: 2021-07-06 — End: 2021-07-07
  Administered 2021-07-06 – 2021-07-07 (×2): 43 [IU] via SUBCUTANEOUS
  Filled 2021-07-05 (×2): qty 0.43

## 2021-07-05 NOTE — Progress Notes (Signed)
PROGRESS NOTE   Subjective/Complaints: Performing self care at the sink Says her muscle relaxer helps but makes her sleepy. Discussed weaning and she is agreeable. Will decrease to '250mg'$  q8H prn.  ROS: Patient denies fever, rash, sore throat, blurred vision, nausea, vomiting, diarrhea, cough, shortness of breath or chest pain,   headache, or mood change. +somnolence from muscle relaxers   Objective:   No results found. No results for input(s): WBC, HGB, HCT, PLT in the last 72 hours.  No results for input(s): NA, K, CL, CO2, GLUCOSE, BUN, CREATININE, CALCIUM in the last 72 hours.   Intake/Output Summary (Last 24 hours) at 07/05/2021 1300 Last data filed at 07/04/2021 1318 Gross per 24 hour  Intake 240 ml  Output --  Net 240 ml        Physical Exam: Vital Signs Blood pressure 130/72, pulse 88, temperature 98 F (36.7 C), temperature source Oral, resp. rate 16, height '5\' 4"'$  (1.626 m), weight 83.4 kg, SpO2 100 %.  Gen: no distress, normal appearing HEENT: oral mucosa pink and moist, NCAT Cardio: Reg rate Chest: normal effort, normal rate of breathing Abd: soft, non-distended Ext: no edema Psych: pleasant, normal affect   Skin: left BK in limb guard with vac in place. Right IJ. Skin remains extremely dry and cracked right foot/toes. Neuro: Pt is cognitively appropriate with normal insight, memory, and awareness. Seems fairly lucent to me. Cranial nerves 2-12 are intact. Sensory exam is normal. Reflexes are 2+ in all 4's. Fine motor coordination is intact. No tremors. Motor function is grossly 5/5 in UE. Can lift LLE off bed. RLE 4/5. [name]  Musculoskeletal: Full ROM, No pain with AROM or PROM in the neck, trunk, or extremities. Posture appropriate    Assessment/Plan: 1. Functional deficits which require 3+ hours per day of interdisciplinary therapy in a comprehensive inpatient rehab setting. Physiatrist is providing close  team supervision and 24 hour management of active medical problems listed below. Physiatrist and rehab team continue to assess barriers to discharge/monitor patient progress toward functional and medical goals  Care Tool:  Bathing    Body parts bathed by patient: Right arm, Left arm, Chest, Abdomen, Front perineal area, Buttocks, Right upper leg, Left upper leg, Right lower leg, Face   Body parts bathed by helper: Right lower leg Body parts n/a: Left lower leg   Bathing assist Assist Level: Contact Guard/Touching assist     Upper Body Dressing/Undressing Upper body dressing   What is the patient wearing?: Pull over shirt    Upper body assist Assist Level: Set up assist    Lower Body Dressing/Undressing Lower body dressing      What is the patient wearing?: Pants     Lower body assist Assist for lower body dressing: Minimal Assistance - Patient > 75%     Toileting Toileting    Toileting assist Assist for toileting: Moderate Assistance - Patient 50 - 74%     Transfers Chair/bed transfer  Transfers assist     Chair/bed transfer assist level: Contact Guard/Touching assist     Locomotion Ambulation   Ambulation assist      Assist level: Contact Guard/Touching assist Assistive device: Walker-rolling Max  distance: 20   Walk 10 feet activity   Assist     Assist level: Contact Guard/Touching assist Assistive device: Walker-rolling   Walk 50 feet activity   Assist Walk 50 feet with 2 turns activity did not occur: Safety/medical concerns         Walk 150 feet activity   Assist Walk 150 feet activity did not occur: Safety/medical concerns         Walk 10 feet on uneven surface  activity   Assist Walk 10 feet on uneven surfaces activity did not occur: Safety/medical concerns         Wheelchair     Assist Will patient use wheelchair at discharge?: Yes Type of Wheelchair: Manual    Wheelchair assist level: Supervision/Verbal  cueing Max wheelchair distance: 100    Wheelchair 50 feet with 2 turns activity    Assist        Assist Level: Supervision/Verbal cueing   Wheelchair 150 feet activity     Assist  Wheelchair 150 feet activity did not occur: Safety/medical concerns       Blood pressure 130/72, pulse 88, temperature 98 F (36.7 C), temperature source Oral, resp. rate 16, height '5\' 4"'$  (1.626 m), weight 83.4 kg, SpO2 100 %.  Medical Problem List and Plan: 1.  Debility secondary to gangrenous left foot.  Status post left BKA 06/27/2021.  Wound VAC as directed             -patient may not shower             -ELOS/Goals: 8-10 days mod I  -Continue CIR therapies including PT, OT  2.  Antithrombotics: -DVT/anticoagulation: Lovenox             -antiplatelet therapy: N/A 3. Pain Management: Oxycodone as needed  7/29 reduce oxy to '5mg'$  q6 prn d/t complaints of AMS  -utilize tylenol, muscle relaxant 4. Mood: Melatonin nightly as needed.  Provide emotional support             -antipsychotic agents: Latuda 40 mg daily 5. Neuropsych: This patient is capable of making decisions on her own behalf. 6. Skin/Wound Care: wound vac thru 7/30  -limb guard  -eucerin cream to right foot 7. Fluids/Electrolytes/Nutrition: Routine in and outs with follow-up chemistries 8.  Acute on chronic anemia.  hgb down to 7.6 7/28  -no gross bleeding still on exam  -f/u again Monday unless source seen or she's symptomatic 9.  Diabetes mellitus with peripheral neuropathy.  Lantus insulin 40 units daily.     CBG (last 3)  Recent Labs    07/04/21 2114 07/05/21 0603 07/05/21 1134  GLUCAP 209* 141* 150*    -7/29 increase insulin (semglee) to 42 u daily  7/30: excellent control 96-114: decrease CBG checks to [facility].   7/31: HS CBG 209, low of 141. Increase Lantus to 43U 10.  Hyperlipidemia.  Crestor 11.  AKI on CKD.  BUN/Cr down to 21/1.14---f/u Monday  12.  Obesity.  BMI 34.33.  Dietary follow-up 13.  Hypertension.   Continue Hygroton 25 mg daily. 14.  Diastolic congestive heart failure.  Monitor for any signs of fluid overload. Weights stable, continue to monitor.  Filed Weights   07/01/21 1454 07/05/21 0417 07/05/21 1104  Weight: 89.6 kg 83.4 kg 83.4 kg    15.  History of pancreatitis.  Continue Creon 16.  History of alcohol tobacco abuse.  Counseling 17. Leukocytosis: wbcs down to 12.5, repeat tomorrow.     LOS: 4 days  A FACE TO FACE EVALUATION WAS PERFORMED  Sarah Long P Sarah Long 07/05/2021, 1:00 PM

## 2021-07-05 NOTE — Progress Notes (Addendum)
Physical Therapy Session Note  Patient Details  Name: SUMMERLIN DIMERY MRN: [id number] Date of Birth: Sep 15, 1971  Today's Date: 07/05/2021 PT Individual Time: 0800-0900; 1300-1350 PT Individual Time Calculation (min): 60 min , 50 min  Short Term Goals: Week 1:  PT Short Term Goal 1 (Week 1): Patient will perform squat pivot transfers with S PT Short Term Goal 2 (Week 1): Patient will ambulated 57' with CGA with RW PT Short Term Goal 3 (Week 1): Patient will perform w/c mobility S over level indoor surfaces 150' PT Short Term Goal 4 (Week 1): Patient to perform standing balance activity x 2 minutes prior to needing seated rest.  Skilled Therapeutic Interventions/Progress Updates:  Tx 1:  Pt resting in bed.  She rated pain RLE 4/10, diffuse. Wrap and shrinker sock in place, but shrinker loose at distal end.  PT educated pt about need for close fit of shrinker; she verbalized understanding and pulled it up with supervison.  In flat bed no rails, supine> sit to R with supervision.  Stand pivot transfer to L with RW, CGA.  Pt donned limb protector with min assist.  Therapeutic exercise performed with LE to increase strength for functional mobility: seated in wc: 15 x 1 R long arc quad knee extensions with 2 second hold, R ankle pumps.  Wc propulsion on level tile with distant supervision, x 80' on level indoor and slightly unlevel outdoor terrain, x 100, before fatiguing.  Mod cues and min assist for manipulation of bil legrests.  Cues for locking brakes.  Sit> stand iwht close supervision.  Gait training with RW over slightly unlevel paved outdoor area, x 20', x15' with CGA and wc follow.  Distance limited by fatigue.  At end of session, pt seated in wc with seat belt alarm set and needs at hand.  Tx 2:  Pt dozing in bed.  She denied pain. Rolling> R to sit up with supervision.  PT and pt re-applied limb protector.  Stand pivot bed> wc to L with close supervision.    Cues to lock brakes of  w/c throughout session.  Pt managed R legrest to place back on wc with supervision, no cues. Wc propulsion over level tile x 250' with distant supervision.   Sit> stand at counter for folding pillow cases; pt tolerated x 2 minutes, performing with R hand only, L forearm supported on counter.  Standing balance at counter, with 0UE support, x 9, 5, 5  seconds.  Gait training x 20' with RW over level tile, CGA.  Sit> supine with supervision.  At end of session,pt resting in bed with alarm set and needs at hand.              Therapy Documentation Precautions:  Precautions Precautions: Fall Precaution Comments: L BKA Required Braces or Orthoses: Other Brace Other Brace: residual limb guard Restrictions Weight Bearing Restrictions: Yes LLE Weight Bearing: Non weight bearing Other Position/Activity Restrictions: no orders related to RIGHT foot per chart      Therapy/Group: Individual Therapy  Rajah Tagliaferro 07/05/2021, 9:27 AM

## 2021-07-05 NOTE — Progress Notes (Signed)
Occupational Therapy Session Note  Patient Details  Name: Sarah Long MRN: [id number] Date of Birth: 10-02-1971  Today's Date: 07/05/2021 OT Individual Time: 1000-1100 and 1430-1500 OT Individual Time Calculation (min): 60 min and 30 min   Short Term Goals: Week 1:  OT Short Term Goal 1 (Week 1): Pt will perform LB bathing with use of LHS with CGA OT Short Term Goal 2 (Week 1): Pt will perform LB dressing with AE PRN with CGA OT Short Term Goal 3 (Week 1): Pt will complete ADL/activity of choice for 10 mins without rest break OT Short Term Goal 4 (Week 1): Pt will perform BSC/toilet transfer with CGA/close supervision and LRAD  Skilled Therapeutic Interventions/Progress Updates:    Visit 1: Pain: no c/o pain Pt seen this session to focus on ADLs at sink level with several sit to stands and worked on standing balance with alternating hands while she engaged in LB bathing and dressing with CGA to min A.  Pt did very well today and tolerated standing activities well.  At end of session, pt chose to rest in bed as she had been up most of the morning.  Resting in bed with all needs met.  Alarm set.   Visit 2: Pain: no c/o pain  Pt received in bed half asleep. She stated she was feeling very tired but agreeable to working on arm strength from bed level.  Pt worked on dowel bar exercises and banded rows for endurance and UE strength of shoulders, back biceps and triceps.  Good participation. Pt resting in bed with all needs met, alarm set.   Therapy Documentation Precautions:  Precautions Precautions: Fall Precaution Comments: L BKA Required Braces or Orthoses: Other Brace Other Brace: residual limb guard Restrictions Weight Bearing Restrictions: Yes LLE Weight Bearing: Non weight bearing Other Position/Activity Restrictions: no orders related to RIGHT foot per chart    Vital Signs: Therapy Vitals Temp: 97.9 F (36.6 C) Temp Source: Oral Pulse Rate: (!) 102 Resp: 18 BP: (!)  141/98 Patient Position (if appropriate): Lying Oxygen Therapy SpO2: 100 % O2 Device: Room Air Pain:   ADL: ADL Eating: Not assessed Grooming: Setup Upper Body Bathing: Supervision/safety Lower Body Bathing: Minimal assistance Upper Body Dressing: Setup Lower Body Dressing: Moderate assistance Toileting: Moderate assistance Toilet Transfer: Minimal assistance Toilet Transfer Method: Stand pivot Toilet Transfer Equipment: Bedside commode   Therapy/Group: Individual Therapy  [place] 07/05/2021, 7:59 AM

## 2021-07-05 NOTE — Plan of Care (Signed)
Continue to educate on positioning and elevation

## 2021-07-06 LAB — GLUCOSE, CAPILLARY
Glucose-Capillary: 162 mg/dL — ABNORMAL HIGH (ref 70–99)
Glucose-Capillary: 141 mg/dL — ABNORMAL HIGH (ref 70–99)
Glucose-Capillary: 162 mg/dL — ABNORMAL HIGH (ref 70–99)
Glucose-Capillary: 213 mg/dL — ABNORMAL HIGH (ref 70–99)

## 2021-07-06 LAB — BASIC METABOLIC PANEL
Anion gap: 7 (ref 5–15)
BUN: 19 mg/dL (ref 6–20)
CO2: 27 mmol/L (ref 22–32)
Calcium: 8.2 mg/dL — ABNORMAL LOW (ref 8.9–10.3)
Chloride: 102 mmol/L (ref 98–111)
Creatinine, Ser: 1.11 mg/dL — ABNORMAL HIGH (ref 0.44–1.00)
GFR, Estimated: >60 mL/min (ref >60)
Glucose, Bld: 179 mg/dL — ABNORMAL HIGH (ref 70–99)
Potassium: 3.7 mmol/L (ref 3.5–5.1)
Sodium: 136 mmol/L (ref 135–145)

## 2021-07-06 LAB — CBC
HCT: 26.6 % — ABNORMAL LOW (ref 36.0–46.0)
Hemoglobin: 8.1 g/dL — ABNORMAL LOW (ref 12.0–15.0)
MCH: 27.5 pg (ref 26.0–34.0)
MCHC: 30.5 g/dL (ref 30.0–36.0)
MCV: 90.2 fL (ref 80.0–100.0)
Platelets: 332 10*3/uL (ref 150–400)
RBC: 2.95 MIL/uL — ABNORMAL LOW (ref 3.87–5.11)
RDW: 16.9 % — ABNORMAL HIGH (ref 11.5–15.5)
WBC: 10 10*3/uL (ref 4.0–10.5)
nRBC: 0 % (ref 0.0–0.2)

## 2021-07-06 MED ORDER — PROCHLORPERAZINE EDISYLATE 10 MG/2ML IJ SOLN: 10.0000 mg | Freq: Four times a day (QID) | INTRAMUSCULAR | Status: DC | PRN

## 2021-07-06 MED ORDER — PROCHLORPERAZINE MALEATE 5 MG PO TABS
10.0000 mg | ORAL_TABLET | Freq: Four times a day (QID) | ORAL | Status: DC | PRN
  Administered 2021-07-06 – 2021-07-13 (×4): 10 mg via ORAL
  Filled 2021-07-06 (×4): qty 2

## 2021-07-06 NOTE — Progress Notes (Signed)
Occupational Therapy Session Note  Patient Details  Name: NARYIAH TORP MRN: [id number] Date of Birth: 11-May-1971  Today's Date: 07/06/2021 OT Individual Time: [id number] OT Individual Time Calculation (min): 57 min    Short Term Goals: Week 1:  OT Short Term Goal 1 (Week 1): Pt will perform LB bathing with use of LHS with CGA OT Short Term Goal 2 (Week 1): Pt will perform LB dressing with AE PRN with CGA OT Short Term Goal 3 (Week 1): Pt will complete ADL/activity of choice for 10 mins without rest break OT Short Term Goal 4 (Week 1): Pt will perform BSC/toilet transfer with CGA/close supervision and LRAD  Skilled Therapeutic Interventions/Progress Updates:    Treatment session with focus on self-care retraining, dynamic standing balance, transfers, and care for residual limb.  Pt received supine in bed agreeable to therapy session.  Pt reports nausea during PT session but no c/o nausea during this therapy session.  Pt expressing desire to engage in bathing/dressing at sink.  Pt completed bed mobility supervision and completed stand pivot transfer with RW CGA to w/c.  Pt engaged in UB bathing and dressing with setup assist and LB bathing and dressing at sit > stand level with CGA.  Therapist assisted with threading wound vac tubing and reapplying residual limb guard post dressing.  Pt completed all sit > stand and dynamic standing during self-care tasks with CGA.  Therapist provided pt with "First step" publication and engaged in education regarding limb healing, desensitization strategies, alternative pain management options, and importance of care for limb and limb inspection.  Pt still somewhat hesitant about care for residual limb - plan to continue to address especially as wound vac removed in next few days.  Pt requested to return to bed.  Completed stand pivot transfer with RW CGA.  Pt remained semi-reclined in bed with all needs in reach.  Therapy Documentation Precautions:   Precautions Precautions: Fall Precaution Comments: L BKA Required Braces or Orthoses: Other Brace Other Brace: residual limb guard Restrictions Weight Bearing Restrictions: Yes LLE Weight Bearing: Non weight bearing Other Position/Activity Restrictions: no orders related to RIGHT foot per chart Pain: Pain Assessment Pain Scale: 0-10 Pain Score: 5  Pain Type: Acute pain Pain Location: Groin Pain Orientation: Left Pain Descriptors / Indicators: Aching;Operative site guarding;Discomfort Pain Onset: Gradual Patients Stated Pain Goal: 2 Pain Intervention(s): Medication (See eMAR);Elevated extremity;Rest Multiple Pain Sites: No PAINAD (Pain Assessment in Advanced Dementia) Breathing: normal Negative Vocalization: none Facial Expression: smiling or inexpressive Body Language: relaxed Consolability: no need to console PAINAD Score: 0   Therapy/Group: Individual Therapy  Simonne Come 07/06/2021, 10:48 AM

## 2021-07-06 NOTE — Consult Note (Signed)
WOC Nurse Consult Note: Patient receiving care in [facility] [phone] Reason for Consult: Right toes Wound type: Right great toe and second toe necrotic wounds. Pressure Injury POA: NA Measurement: Right great posterior side of the toe measures 1.5 cm x 1.5 cm x 0.2 cm Right second toe anterior side measures 0.5 cm x 0.5 cm x 0.1 cm Wound bed: Black Drainage (amount, consistency, odor) None Periwound: Dry Dressing procedure/placement/frequency: Clean the right foot with soap and water, rinse and pat dry. Paint the toes with Betedine swab, allow to air dry then apply a generous coat of Eucerin cream to the foot and replace sock.   Monitor the wound area(s) for worsening of condition such as: Signs/symptoms of infection, increase in size, development of or worsening of odor, development of pain, or increased pain at the affected locations.   Notify the medical team if any of these develop.  Thank you for the consult. [facility] nurse will not follow at this time.   Please re-consult the [facility] team if needed.  Cathlean Marseilles Tamala Julian, MSN, RN, [place], Lysle Pearl, [facility] Wound Treatment Associate Pager [phone]

## 2021-07-06 NOTE — Progress Notes (Signed)
PROGRESS NOTE   Subjective/Complaints:  Feels well today. Denies excessive LLE pain. Vac remains on  ROS: Patient denies fever, rash, sore throat, blurred vision, nausea, vomiting, diarrhea, cough, shortness of breath or chest pain,   headache, or mood change.      Objective:   No results found. Recent Labs    07/06/21 0544  WBC 10.0  HGB 8.1*  HCT 26.6*  PLT 332    Recent Labs    07/06/21 0544  NA 136  K 3.7  CL 102  CO2 27  GLUCOSE 179*  BUN 19  CREATININE 1.11*  CALCIUM 8.2*     Intake/Output Summary (Last 24 hours) at 07/06/2021 1236 Last data filed at 07/06/2021 0801 Gross per 24 hour  Intake 240 ml  Output --  Net 240 ml        Physical Exam: Vital Signs Blood pressure (!) 121/59, pulse 82, temperature 98.5 F (36.9 C), temperature source Oral, resp. rate 18, height '5\' 4"'$  (1.626 m), weight 83.6 kg, SpO2 100 %.  Constitutional: No distress . Vital signs reviewed. HEENT: EOMI, oral membranes moist Neck: supple Cardiovascular: RRR without murmur. No JVD    Respiratory/Chest: CTA Bilaterally without wheezes or rales. Normal effort    GI/Abdomen: BS +, non-tender, non-distended Ext: no clubbing, cyanosis, or edema Psych: pleasant and cooperative  Skin: left stump vac dressing removed. Wound well approximated and dry with staples. Skin remains extremely dry and cracked right foot/toes. Neuro: Pt is cognitively appropriate with normal insight, memory, and awareness. Seems fairly lucent to me. Cranial nerves 2-12 are intact. Sensory exam is normal. Reflexes are 2+ in all 4's. Fine motor coordination is intact. No tremors. Motor function is grossly 5/5 in UE. Can lift LLE off bed. RLE 4/5. [name]  Musculoskeletal: Full ROM, No pain with AROM or PROM in the neck, trunk, or extremities. Posture appropriate. Left stump still tender   Assessment/Plan: 1. Functional deficits which require 3+ hours per day of  interdisciplinary therapy in a comprehensive inpatient rehab setting. Physiatrist is providing close team supervision and 24 hour management of active medical problems listed below. Physiatrist and rehab team continue to assess barriers to discharge/monitor patient progress toward functional and medical goals  Care Tool:  Bathing    Body parts bathed by patient: Right arm, Left arm, Chest, Abdomen, Front perineal area, Buttocks, Right upper leg, Left upper leg, Right lower leg, Face   Body parts bathed by helper: Right lower leg Body parts n/a: Left lower leg   Bathing assist Assist Level: Contact Guard/Touching assist     Upper Body Dressing/Undressing Upper body dressing   What is the patient wearing?: Pull over shirt    Upper body assist Assist Level: Set up assist    Lower Body Dressing/Undressing Lower body dressing      What is the patient wearing?: Pants, Underwear/pull up, Orthosis     Lower body assist Assist for lower body dressing: Minimal Assistance - Patient > 75%     Toileting Toileting    Toileting assist Assist for toileting: Moderate Assistance - Patient 50 - 74%     Transfers Chair/bed transfer  Transfers assist  Chair/bed transfer assist level: Contact Guard/Touching assist     Locomotion Ambulation   Ambulation assist      Assist level: Contact Guard/Touching assist Assistive device: Walker-rolling Max distance: 20   Walk 10 feet activity   Assist     Assist level: Contact Guard/Touching assist Assistive device: Walker-rolling   Walk 50 feet activity   Assist Walk 50 feet with 2 turns activity did not occur: Safety/medical concerns         Walk 150 feet activity   Assist Walk 150 feet activity did not occur: Safety/medical concerns         Walk 10 feet on uneven surface  activity   Assist Walk 10 feet on uneven surfaces activity did not occur: Safety/medical concerns          Wheelchair     Assist Will patient use wheelchair at discharge?: Yes Type of Wheelchair: Manual    Wheelchair assist level: Supervision/Verbal cueing Max wheelchair distance: 250    Wheelchair 50 feet with 2 turns activity    Assist        Assist Level: Supervision/Verbal cueing   Wheelchair 150 feet activity     Assist  Wheelchair 150 feet activity did not occur: Safety/medical concerns   Assist Level: Supervision/Verbal cueing   Blood pressure (!) 121/59, pulse 82, temperature 98.5 F (36.9 C), temperature source Oral, resp. rate 18, height '5\' 4"'$  (1.626 m), weight 83.6 kg, SpO2 100 %.  Medical Problem List and Plan: 1.  Debility secondary to gangrenous left foot.  Status post left BKA 06/27/2021.                -patient may not shower             -ELOS/Goals: 8-10 days mod I  -Continue CIR therapies including PT, OT  2.  Antithrombotics: -DVT/anticoagulation: Lovenox             -antiplatelet therapy: N/A 3. Pain Management: Oxycodone as needed  7/29 reduce oxy to '5mg'$  q6 prn d/t complaints of AMS  -utilize tylenol, muscle relaxant 4. Mood: Melatonin nightly as needed.  Provide emotional support             -antipsychotic agents: Latuda 40 mg daily 5. Neuropsych: This patient is capable of making decisions on her own behalf. 6. Skin/Wound Care: wound vac thru 7/30---> removed vac today  -wound looks well enough that we can just used silver shrinker alone. Will. Ask [employer] for more shrinkers  -limb guard  -eucerin cream to right foot 7. Fluids/Electrolytes/Nutrition: encourage PO  -I personally reviewed the patient's labs today.   8.  Acute on chronic anemia.     -no gross bleeding on exam  -hgb up to  8.1 on 8/1 9.  Diabetes mellitus with peripheral neuropathy.  Lantus insulin 40 units daily.     CBG (last 3)  Recent Labs    07/05/21 2111 07/06/21 0613 07/06/21 1149  GLUCAP 251* 162* 141*    -7/29 increase insulin (semglee) to 42 u  daily  7/30: excellent control 96-114: decrease CBG checks to [facility].   7/31: HS CBG 209, low of 141. Increased Lantus to 43U  8/1 cbg's still elevated. Increase lantus to 45u daily 10.  Hyperlipidemia.  Crestor 11.  AKI on CKD.  BUN/Cr down to 21/1.14---f/u Monday  12.  Obesity.  BMI 34.33.  Dietary follow-up 13.  Hypertension.  Continue Hygroton 25 mg daily. 14.  Diastolic congestive heart failure.  Monitor for any  signs of fluid overload. Weights stable, continue to monitor.  Filed Weights   07/05/21 0417 07/05/21 1104 07/06/21 0353  Weight: 83.4 kg 83.4 kg 83.6 kg   Weights balanced 15.  History of pancreatitis.  Continue Creon 16.  History of alcohol tobacco abuse.  Counseling 17. Leukocytosis: wbcs down to 12.5--> 10.0 8/1    LOS: 5 days A FACE TO FACE EVALUATION WAS PERFORMED  Meredith Staggers 07/06/2021, 12:36 PM

## 2021-07-06 NOTE — Progress Notes (Signed)
Occupational Therapy Session Note  Patient Details  Name: Sarah Long MRN: [id number] Date of Birth: [birth year]  Today's Date: 07/06/2021 OT Individual Time: [id number] OT Individual Time Calculation (min): 60 min    Short Term Goals: Week 1:  OT Short Term Goal 1 (Week 1): Pt will perform LB bathing with use of LHS with CGA OT Short Term Goal 2 (Week 1): Pt will perform LB dressing with AE PRN with CGA OT Short Term Goal 3 (Week 1): Pt will complete ADL/activity of choice for 10 mins without rest break OT Short Term Goal 4 (Week 1): Pt will perform BSC/toilet transfer with CGA/close supervision and LRAD  Skilled Therapeutic Interventions/Progress Updates:   Pt greeted supine in bed reporting need to void bladder, supervision for bed mobility and CGA for stand pivot transfer from EOB>w/c with RW, pt transported to toilet with total A, CGA for stand pivot transfer with pt using grab bar for stand pivot, MIN A for 3/3 toileting tasks. Pt completed w/c propulsion to ADL apartment with supervision. Pt completed bed mobility, shower transfer to TTB and functional reaching in kitchen with CGA. Pt reports nausea, returned to room with RN providing meds, remainder of session to focus on residual limb care, OTA doffed shrinker, performed hygiene and pt able to assist with donning new shrinker ( old shrinker cleaned and hung in BR to dry). Pt hesitant to touch residual limb but did make an effort to assist with donning shrinker. Pt left supine in bed with bed alarm activated and all needs within reach.  Therapy Documentation Precautions:  Precautions Precautions: Fall Precaution Comments: L BKA Required Braces or Orthoses: Other Brace Other Brace: residual limb guard Restrictions Weight Bearing Restrictions: Yes LLE Weight Bearing: Non weight bearing Other Position/Activity Restrictions: no orders related to RIGHT foot per chart  Pain: Pt reports no pain during session.    Therapy/Group:  Individual Therapy  Precious Haws 07/06/2021, 4:29 PM

## 2021-07-06 NOTE — Progress Notes (Signed)
Physical Therapy Session Note  Patient Details  Name: Sarah Long MRN: [id number] Date of Birth: Jul 12, 1971  Today's Date: 07/06/2021 PT Individual Time: [id number] and [id number] PT Individual Time Calculation (min): 55 min and 24 min  Short Term Goals: Week 1:  PT Short Term Goal 1 (Week 1): Patient will perform squat pivot transfers with S PT Short Term Goal 2 (Week 1): Patient will ambulated 41' with CGA with RW PT Short Term Goal 3 (Week 1): Patient will perform w/c mobility S over level indoor surfaces 150' PT Short Term Goal 4 (Week 1): Patient to perform standing balance activity x 2 minutes prior to needing seated rest.  Skilled Therapeutic Interventions/Progress Updates:   Treatment Session 1 Received pt supine in bed, pt agreeable to PT treatment, and denied any pain during session. Session with emphasis on functional mobility/transfers, generalized strengthening, and improved activity tolerance. Donned L limb guard in supine with mod A and pt performed bed mobility with supervision x 2 trials throughout session. Donned R shoe with supervision and performed x 2 stand<>pivots with RW and CGA during session. Donned clean shirt with supervision, then suddenly reported feeling nauseous (possibly from pain medication and not eating breakfast this morning). RN notified and requested order for anti-nausea medication from MD and educated pt on importance of eating to avoid taking pain medication on empty stomach. Pt transported to ortho gym in [facility] total A for energy conservation purposes and performed BUE strengthening on UBE at level 3 for 3 minutes forward and 3 minutes backwards with 4 rest breaks due to nausea. Pt reported increased dizziness. BP: 139/73, but reported decrease in symptoms with seated rest break. Pt performed the following exercises sitting in WC with supervision and verbal cues for technique: -R hip flexion 2x12 with 2lb ankle weight -R LAQ 2x12 with 2lb ankle weight  -hip  adduction ball squeezes 2x10 Pt transported back to room in [facility] total A and requested to return to bed. Concluded session with pt supine in bed, needs within reach, and bed alarm on. Provided pt with ice water, ginger ale, and crackers  Treatment Session 2 Received pt supine in bed, pt agreeable to PT treatment, and denied any pain during session and reported feeling much better this afternoon compared to this morning. Session with emphasis on functional mobility/transfers, generalized strengthening, ambulation, and improved activity tolerance. Pt performed bed mobility with supervision x 2 trials and donned R shoe with supervision. Pt performed x 3 stand<>pivot transfers with RW and CGA throughout session. Pt transported to ortho gym in [facility] total A for time management purposes and ambulated 86f with RW and CGA - required cues to keep RW within BOS and to take smaller "hops" for safety. Pt performed overhead chest press with 5lb dowel 2x10 with emphasis on UE strengthening but required rest breaks throughout reps due to RUE muscle soreness. Concluded session with pt supine in bed, needs within reach, and bed alarm on.   Therapy Documentation Precautions:  Precautions Precautions: Fall Precaution Comments: L BKA Required Braces or Orthoses: Other Brace Other Brace: residual limb guard Restrictions Weight Bearing Restrictions: Yes LLE Weight Bearing: Non weight bearing Other Position/Activity Restrictions: no orders related to RIGHT foot per chart  Therapy/Group: Individual Therapy AAlfonse AlpersPT, DPT   07/06/2021, 7:29 AM

## 2021-07-06 NOTE — Progress Notes (Signed)
Orthopedic Tech Progress Note Patient Details:  Sarah Long 12/14/1970 [id number]  Called in order to [employer] for a BKA SHRINKER   Patient ID: Sarah Long, female   DOB: [birth year], 50 y.o.   MRN: [id number]  [name] 07/06/2021, 2:22 PM

## 2021-07-07 ENCOUNTER — Ambulatory Visit: Payer: Medicaid Other | Admitting: Cardiovascular Disease

## 2021-07-07 LAB — GLUCOSE, CAPILLARY
Glucose-Capillary: 199 mg/dL — ABNORMAL HIGH (ref 70–99)
Glucose-Capillary: 163 mg/dL — ABNORMAL HIGH (ref 70–99)
Glucose-Capillary: 225 mg/dL — ABNORMAL HIGH (ref 70–99)
Glucose-Capillary: 246 mg/dL — ABNORMAL HIGH (ref 70–99)

## 2021-07-07 MED ORDER — METHOCARBAMOL 500 MG PO TABS: 250.0000 mg | ORAL_TABLET | Freq: Two times a day (BID) | ORAL | Status: DC | PRN

## 2021-07-07 MED ORDER — INSULIN GLARGINE-YFGN 100 UNIT/ML ~~LOC~~ SOLN
44.0000 [IU] | Freq: Every day | SUBCUTANEOUS | Status: DC
  Administered 2021-07-08 – 2021-07-09 (×2): 44 [IU] via SUBCUTANEOUS
  Filled 2021-07-07 (×2): qty 0.44

## 2021-07-07 NOTE — Plan of Care (Signed)
  Problem: RH Ambulation Goal: LTG Patient will ambulate in controlled environment (PT) Description: LTG: Patient will ambulate in a controlled environment, # of feet with assistance (PT). Flowsheets (Taken 07/07/2021 0751) LTG: Pt will ambulate in controlled environ  assist needed:: (downgraded due to decreased balance/postural control, weakness, and wound on R foot) Supervision/Verbal cueing LTG: Ambulation distance in controlled environment: 92f with LRAD Goal: LTG Patient will ambulate in home environment (PT) Description: LTG: Patient will ambulate in home environment, # of feet with assistance (PT). Flowsheets (Taken 07/07/2021 0751) LTG: Pt will ambulate in home environ  assist needed:: (downgraded due to decreased balance/postural control, weakness, and wound on R foot) Supervision/Verbal cueing LTG: Ambulation distance in home environment: 257fwith LRAD Note: downgraded due to decreased balance/postural control, weakness, and wound on R foot

## 2021-07-07 NOTE — Progress Notes (Signed)
Occupational Therapy Session Note  Patient Details  Name: Sarah Long MRN: [id number] Date of Birth: 12/24/1970  Today's Date: 07/07/2021 OT Individual Time: [id number] OT Individual Time Calculation (min): 68 min    Short Term Goals: Week 1:  OT Short Term Goal 1 (Week 1): Pt will perform LB bathing with use of LHS with CGA OT Short Term Goal 2 (Week 1): Pt will perform LB dressing with AE PRN with CGA OT Short Term Goal 3 (Week 1): Pt will complete ADL/activity of choice for 10 mins without rest break OT Short Term Goal 4 (Week 1): Pt will perform BSC/toilet transfer with CGA/close supervision and LRAD  Skilled Therapeutic Interventions/Progress Updates:    Treatment session with focus on self-care retraining, functional transfers, dynamic standing balance, and BUE strengthening and overall endurance.  Pt received upright in w/c agreeable to shower this session.  Pt completed stand pivot transfer w/c > tub bench in room shower with CGA and use of grab bars.  Therapist covered residual limb in preparation for shower.  Pt completed bathing with lateral leans with supervision and sit > stand with CGA when washing buttocks.  Pt pleased with ability to shower this session.  Pt completed stand pivot transfer back to w/c after showering with CGA.  Engaged in dressing at sit > stand level at sink.  Pt requiring assistance with threading RLE due to decreased sensation in toes and getting pants caught on toes.  Therapist assisted with donning limb guard to residual limb while educating on proper fit.  Pt completed clothing management in standing with CGA for standing balance.  Engaged in [place] and endurance with 2# dowel completing 3 sets of 10 chest and overhead presses and lateral rows.  Pt returned to room and ambulated 10' to EOB with RW with CGA.  Pt transitioned to supine with CGA and left semi-reclined with all needs in reach.  Therapy Documentation Precautions:   Precautions Precautions: Fall Precaution Comments: L BKA Required Braces or Orthoses: Other Brace Other Brace: residual limb guard Restrictions Weight Bearing Restrictions: Yes LLE Weight Bearing: Non weight bearing Other Position/Activity Restrictions: no orders related to RIGHT foot per chart  Pain:  Pt with reports of pain along lateral aspect of RLE.    Therapy/Group: Individual Therapy  Simonne Come 07/07/2021, 1:22 PM

## 2021-07-07 NOTE — Patient Care Conference (Signed)
Inpatient RehabilitationTeam Conference and Plan of Care Update Date: 07/07/2021   Time: 2:42 PM 13:30 PM  Patient Name: Sarah Long      Medical Record Number: [id number]  Date of Birth: [birth year] Sex: Female         Room/Bed: 4M06C/4M06C-01 Payor Info: Payor: MEDICAID [place] / Plan: MEDICAID [place] ACCESS / Product Type: *No Product type* /    Admit Date/Time:  07/01/2021  1:47 PM  Primary Diagnosis:  Left below-knee amputee [facility])  Hospital Problems: Principal Problem:   Left below-knee amputee [facility])    Expected Discharge Date: Expected Discharge Date: 07/15/21  Team Members Present: Physician leading conference: Dr. Leeroy Cha Social Worker Present: Erlene Quan, BSW Nurse Present: Dorien Chihuahua, RN PT Present: Becky Sax, PT OT Present: Simonne Come, OT PPS Coordinator present : Gunnar Fusi, SLP     Current Status/Progress Goal Weekly Team Focus  Bowel/Bladder             Swallow/Nutrition/ Hydration             ADL's   CGA bathing and dressing at sit > stand level at sink CGA stand pivot transfers with RW, pt continues to be hesitant about care for residual limb  Mod I  ADL retraining, transfers, dynamic standing balance, generalized strengthening, care for residual limb   Mobility   bed mobility supervision, transfers with RW CGA, gait 68f with RW CGA  Mod I, min A stairs  functional mobility/transfers, generalized strengthening, dynamic standing balance/coordination, gait training, amputee education, endurance, D/C planning   Communication             Safety/Cognition/ Behavioral Observations            Pain             Skin               Discharge Planning:  HOme with minor children her grown daughter can come by daily to make sure has what she needs. Minor children may stay with Dad a few weeks to let pt transition back home   Team Discussion: Pain managed with prn medications however muscle relaxers make her drowsy; weaning off in  process. MD adjusting DM medications. Enc fluids for AKI/CKD. Working on skin care/management of residual limb as patient is hesitant to touch and care for incision. Able to don/doff shrinker.  Patient on target to meet rehab goals: yes, currently CGA for transfers and able to ambulate 20' with a walker and CGA. CGA for dynamic standing and cues for safety. Discharge goals set for mod I overall.  *See Care Plan and progress notes for long and short-term goals.   Revisions to Treatment Plan:  Residual limb care practice; don/doff shrinker   Teaching Needs: Safety, skin care, medications, secondary risk management, etc.  Current Barriers to Discharge: Decreased caregiver support, Home enviroment access/layout, and Wound care  Possible Resolutions to Barriers: DME recommended; walker, W/C and TTB     Medical Summary Current Status: obesity (BMI 31.03), residual limb pain, sedation from medication, acute on chronic kidney inury, uncontrolled type 2 diabetes mellitus with peripheral neuropathy  Barriers to Discharge: Medical stability  Barriers to Discharge Comments: obesity (BMI 31.03), residual limb pain, sedation from medication, acute on chronic kidney inury, uncontrolled type 2 diabetes mellitus with peripheral neuropathy Possible Resolutions to [employer]: provide dietary education, continue current pain medication and wean as tolerated, encouraged 6-8 glass of water per day, increase glargine   Continued Need for  Acute Rehabilitation Level of Care: The patient requires daily medical management by a physician with specialized training in physical medicine and rehabilitation for the following reasons: Direction of a multidisciplinary physical rehabilitation program to maximize functional independence : Yes Medical management of patient stability for increased activity during participation in an intensive rehabilitation regime.: Yes Analysis of laboratory values and/or radiology  reports with any subsequent need for medication adjustment and/or medical intervention. : Yes   I attest that I was present, lead the team conference, and concur with the assessment and plan of the team.   Dorien Chihuahua B 07/07/2021, 2:42 PM

## 2021-07-07 NOTE — Progress Notes (Signed)
Physical Therapy Session Note  Patient Details  Name: Sarah Long MRN: [id number] Date of Birth: October 04, 1971  Today's Date: 07/07/2021 PT Individual Time: [id number] and [id number] PT Individual Time Calculation (min): 55 min and 26 min  Short Term Goals: Week 1:  PT Short Term Goal 1 (Week 1): Patient will perform squat pivot transfers with S PT Short Term Goal 2 (Week 1): Patient will ambulated 54' with CGA with RW PT Short Term Goal 3 (Week 1): Patient will perform w/c mobility S over level indoor surfaces 150' PT Short Term Goal 4 (Week 1): Patient to perform standing balance activity x 2 minutes prior to needing seated rest.  Skilled Therapeutic Interventions/Progress Updates:   Treatment Session 1 Received pt supine in bed asleep, upon wakening pt agreeable to PT treatment, and denied any pain at rest but mild pain in L knee with exercises. Session with emphasis on functional mobility/transfers, generalized strengthening, amputee education, and improved activity tolerance. Pt performed bed mobility with supervision and performed x 3 stand<>pivots with RW and close supervision/CGA throughout session. Pt performed WC mobility 143f using BUE and supervision with 2 rest breaks then transported remainder of way [facility] due to R UE fatigue. Sit<>supine and supine<>long sitting with supervision and performed the following exercises on mat with supervision and verbal/visual cues for technique: -SLR 2x10 on RLE and 2x12 on L residual limb -hip abduction 2x10 bilaterally -RLE SAQ with 2lb ankle weight 2x12 -chest press with 6lb dowel 2x10 -prone L hip extension 4x5 -prone R hamstring curls with 2lb ankle weight 2x8 Educated pt on importance of lying in prone multiple times a day to prevent hip flexion contractures and prepare residual limb for prosthetic fitting as well as importance of desensitization and various techniques including massage/tapping/rubbing. Of note, pt frequently  stopping mid-set of exercises due to fatigue/weakness and required numerous rest breaks. [facility] reach awaiting OT session.   Treatment Session 2 Received pt sitting in WC, pt agreeable to PT treatment, and denied any pain during session. Session with emphasis on functional mobility/transfers, generalized strengthening, dynamic standing balance/coordination, and improved activity tolerance. Donned L limb guard with max A and pt [facility] for time management purposes. Sit<>stands with RW and close supervision x 2 trials and worked on dynamic standing balance tossing horseshoes alternating between using R and L UEs with close supervision/CGA for balance x 4 trials with no LOB noted but required 1 rest break due to RLE weakness/fatigue. Stand<>pivot WC<>bed with RW and supervision and sit<>supine with supervision. Concluded session with pt supine in bed, needs within reach, and bed alarm on. Provided pt with drink/snack.   Therapy Documentation Precautions:  Precautions Precautions: Fall Precaution Comments: L BKA Required Braces or Orthoses: Other Brace Other Brace: residual limb guard Restrictions Weight Bearing Restrictions: Yes LLE Weight Bearing: Non weight bearing Other Position/Activity Restrictions: no orders related to RIGHT foot per chart  Therapy/Group: Individual Therapy AAlfonse AlpersPT, DPT   07/07/2021, 7:21 AM

## 2021-07-07 NOTE — Progress Notes (Signed)
PROGRESS NOTE   Subjective/Complaints: Felt twinge in her LLE but is doing well with her muscle relaxer wean- will continue to wean to BID PRN given effects on her cognition Did well with therapy this morning  ROS: Patient denies fever, rash, sore throat, blurred vision, nausea, vomiting, diarrhea, cough, shortness of breath or chest pain, headache, or mood change, +residual limb pain  Objective:   No results found. Recent Labs    07/06/21 0544  WBC 10.0  HGB 8.1*  HCT 26.6*  PLT 332    Recent Labs    07/06/21 0544  NA 136  K 3.7  CL 102  CO2 27  GLUCOSE 179*  BUN 19  CREATININE 1.11*  CALCIUM 8.2*     Intake/Output Summary (Last 24 hours) at 07/07/2021 1232 Last data filed at 07/07/2021 0740 Gross per 24 hour  Intake 716 ml  Output --  Net 716 ml        Physical Exam: Vital Signs Blood pressure 120/79, pulse 83, temperature 97.9 F (36.6 C), resp. rate 16, height '5\' 4"'$  (1.626 m), weight 82 kg, SpO2 100 %. Gen: no distress, normal appearing HEENT: oral mucosa pink and moist, NCAT Cardio: Reg rate Chest: normal effort, normal rate of breathing Abd: soft, non-distended Ext: no edema Psych: pleasant, normal affect Skin: left stump vac dressing removed. Wound well approximated and dry with staples. Skin remains extremely dry and cracked right foot/toes. Neuro: Pt is cognitively appropriate with normal insight, memory, and awareness. Seems fairly lucent to me. Cranial nerves 2-12 are intact. Sensory exam is normal. Reflexes are 2+ in all 4's. Fine motor coordination is intact. No tremors. Motor function is grossly 5/5 in UE. Can lift LLE off bed. RLE 4/5. [name]  Musculoskeletal: Full ROM, No pain with AROM or PROM in the neck, trunk, or extremities. Posture appropriate. Left stump still tender   Assessment/Plan: 1. Functional deficits which require 3+ hours per day of interdisciplinary therapy in a comprehensive  inpatient rehab setting. Physiatrist is providing close team supervision and 24 hour management of active medical problems listed below. Physiatrist and rehab team continue to assess barriers to discharge/monitor patient progress toward functional and medical goals  Care Tool:  Bathing    Body parts bathed by patient: Right arm, Left arm, Chest, Abdomen, Front perineal area, Buttocks, Right upper leg, Left upper leg, Right lower leg, Face   Body parts bathed by helper: Right lower leg Body parts n/a: Left lower leg   Bathing assist Assist Level: Contact Guard/Touching assist     Upper Body Dressing/Undressing Upper body dressing   What is the patient wearing?: Pull over shirt    Upper body assist Assist Level: Set up assist    Lower Body Dressing/Undressing Lower body dressing      What is the patient wearing?: Pants, Underwear/pull up, Orthosis     Lower body assist Assist for lower body dressing: Minimal Assistance - Patient > 75%     Toileting Toileting    Toileting assist Assist for toileting: Minimal Assistance - Patient > 75%     Transfers Chair/bed transfer  Transfers assist     Chair/bed transfer assist level: Supervision/Verbal cueing  Locomotion Ambulation   Ambulation assist      Assist level: Contact Guard/Touching assist Assistive device: Walker-rolling Max distance: 20   Walk 10 feet activity   Assist     Assist level: Contact Guard/Touching assist Assistive device: Walker-rolling   Walk 50 feet activity   Assist Walk 50 feet with 2 turns activity did not occur: Safety/medical concerns         Walk 150 feet activity   Assist Walk 150 feet activity did not occur: Safety/medical concerns         Walk 10 feet on uneven surface  activity   Assist Walk 10 feet on uneven surfaces activity did not occur: Safety/medical concerns         Wheelchair     Assist Will patient use wheelchair at discharge?:  Yes Type of Wheelchair: Manual    Wheelchair assist level: Supervision/Verbal cueing Max wheelchair distance: 159f    Wheelchair 50 feet with 2 turns activity    Assist        Assist Level: Supervision/Verbal cueing   Wheelchair 150 feet activity     Assist  Wheelchair 150 feet activity did not occur: Safety/medical concerns   Assist Level: Supervision/Verbal cueing   Blood pressure 120/79, pulse 83, temperature 97.9 F (36.6 C), resp. rate 16, height '5\' 4"'$  (1.626 m), weight 82 kg, SpO2 100 %.  Medical Problem List and Plan: 1.  Debility secondary to gangrenous left foot.  Status post left BKA 06/27/2021.                -patient may not shower             -ELOS/Goals: 8-10 days mod I  -Continue CIR therapies including PT, OT   -Interdisciplinary Team Conference today   2.  Antithrombotics: -DVT/anticoagulation: Lovenox             -antiplatelet therapy: N/A 3. Residual limb pain: wean methocarbamol to '250mg'$  BID given effects on cognition. Continue oxycodone PRN.  4. Mood: Melatonin nightly as needed.  Provide emotional support             -antipsychotic agents: Latuda 40 mg daily 5. Neuropsych: This patient is capable of making decisions on her own behalf. 6. Skin/Wound Care: wound vac thru 7/30---> removed vac today  -wound looks well enough that we can just used silver shrinker alone. Will. Ask hanger for more shrinkers  -limb guard  -eucerin cream to right foot 7. Fluids/Electrolytes/Nutrition: encourage PO  -I personally reviewed the patient's labs today.   8.  Acute on chronic anemia.     -no gross bleeding on exam  -hgb up to  8.1 on 8/1 9.  Diabetes mellitus with peripheral neuropathy. Increase glargine to 44U. Outpatient Qutenza.   CBG (last 3)  Recent Labs    07/06/21 2124 07/07/21 0626 07/07/21 1137  GLUCAP 213* 199* 163*  10.  Hyperlipidemia.  Continue Crestor 11.  AKI on CKD.  Cr down to 1.11. Encourage 6-8 glasses of water per day.   12.   Obesity.  BMI 34.33.  Dietary follow-up 13.  Hypertension.  Continue Hygroton 25 mg daily. 14.  Diastolic congestive heart failure.  Monitor for any signs of fluid overload. Weights stable, continue to monitor.  Filed Weights   07/05/21 1104 07/06/21 0353 07/07/21 0500  Weight: 83.4 kg 83.6 kg 82 kg   Weights balanced 15.  History of pancreatitis.  Continue Creon 16.  History of alcohol tobacco abuse.  Counseling 17. Leukocytosis:  wbcs down to 12.5--> 10.0 8/1    LOS: 6 days A FACE TO FACE EVALUATION WAS PERFORMED  Clide Deutscher Nazyia Gaugh 07/07/2021, 12:32 PM

## 2021-07-07 NOTE — Progress Notes (Signed)
Patient ID: Sarah Long, female   DOB: 03/19/1971, 50 y.o.   MRN: [id number]  Met with pt to discuss team conference goals of mod/I level and target discharge date is 8/10. She will have someone measure for the wheelchair if would fit. Her minor children will stay for a few more days with Dad so when she is discharged she will be able to transition home and get comfortable before they come home with her. She is pleased with herself with the progress she is making. Her wound vac was discharged today also. Continue to work on discharge needs.

## 2021-07-08 ENCOUNTER — Other Ambulatory Visit: Payer: Self-pay

## 2021-07-08 LAB — GLUCOSE, CAPILLARY
Glucose-Capillary: 173 mg/dL — ABNORMAL HIGH (ref 70–99)
Glucose-Capillary: 229 mg/dL — ABNORMAL HIGH (ref 70–99)
Glucose-Capillary: 229 mg/dL — ABNORMAL HIGH (ref 70–99)
Glucose-Capillary: 245 mg/dL — ABNORMAL HIGH (ref 70–99)

## 2021-07-08 MED ORDER — HYDROCODONE-ACETAMINOPHEN 5-325 MG PO TABS
1.0000 | ORAL_TABLET | Freq: Four times a day (QID) | ORAL | Status: DC | PRN
  Administered 2021-07-09: 1 via ORAL
  Filled 2021-07-08: qty 1

## 2021-07-08 NOTE — Progress Notes (Signed)
PROGRESS NOTE   Subjective/Complaints: C/o nausea this morning and she had to miss 30 minutes of PT due to this. It started after she took an oxycodone. Discussed switching her oxycodone to hydrocodone, taking anti-emetic  ROS: Patient denies fever, rash, sore throat, blurred vision, nausea, vomiting, diarrhea, cough, shortness of breath or chest pain, headache, or mood change, +residual limb pain, +nausea  Objective:   No results found. Recent Labs    07/06/21 0544  WBC 10.0  HGB 8.1*  HCT 26.6*  PLT 332    Recent Labs    07/06/21 0544  NA 136  K 3.7  CL 102  CO2 27  GLUCOSE 179*  BUN 19  CREATININE 1.11*  CALCIUM 8.2*     Intake/Output Summary (Last 24 hours) at 07/08/2021 1412 Last data filed at 07/08/2021 0700 Gross per 24 hour  Intake 118 ml  Output --  Net 118 ml        Physical Exam: Vital Signs Blood pressure 100/65, pulse 78, temperature 98 F (36.7 C), resp. rate 18, height '5\' 4"'$  (1.626 m), weight 80.5 kg, SpO2 100 %. Gen: no distress, normal appearing HEENT: oral mucosa pink and moist, NCAT Cardio: Reg rate Chest: normal effort, normal rate of breathing Abd: soft, non-distended Ext: no edema Psych: pleasant, normal affect Skin: left stump vac dressing removed. Wound well approximated and dry with staples. Skin remains extremely dry and cracked right foot/toes. Neuro: Pt is cognitively appropriate with normal insight, memory, and awareness. Seems fairly lucent to me. Cranial nerves 2-12 are intact. Sensory exam is normal. Reflexes are 2+ in all 4's. Fine motor coordination is intact. No tremors. Motor function is grossly 5/5 in UE. Can lift LLE off bed. RLE 4/5. [name]  Musculoskeletal: Full ROM, No pain with AROM or PROM in the neck, trunk, or extremities. Posture appropriate. Left stump still tender   Assessment/Plan: 1. Functional deficits which require 3+ hours per day of interdisciplinary  therapy in a comprehensive inpatient rehab setting. Physiatrist is providing close team supervision and 24 hour management of active medical problems listed below. Physiatrist and rehab team continue to assess barriers to discharge/monitor patient progress toward functional and medical goals  Care Tool:  Bathing    Body parts bathed by patient: Right arm, Left arm, Chest, Abdomen, Front perineal area, Buttocks, Right upper leg, Left upper leg, Right lower leg, Face   Body parts bathed by helper: Right lower leg Body parts n/a: Left lower leg   Bathing assist Assist Level: Contact Guard/Touching assist     Upper Body Dressing/Undressing Upper body dressing   What is the patient wearing?: Pull over shirt, Bra    Upper body assist Assist Level: Minimal Assistance - Patient > 75%    Lower Body Dressing/Undressing Lower body dressing      What is the patient wearing?: Pants, Underwear/pull up, Orthosis     Lower body assist Assist for lower body dressing: Minimal Assistance - Patient > 75%     Toileting Toileting    Toileting assist Assist for toileting: Minimal Assistance - Patient > 75%     Transfers Chair/bed transfer  Transfers assist     Chair/bed transfer  assist level: Supervision/Verbal cueing     Locomotion Ambulation   Ambulation assist      Assist level: Contact Guard/Touching assist Assistive device: Walker-rolling Max distance: 20   Walk 10 feet activity   Assist     Assist level: Contact Guard/Touching assist Assistive device: Walker-rolling   Walk 50 feet activity   Assist Walk 50 feet with 2 turns activity did not occur: Safety/medical concerns         Walk 150 feet activity   Assist Walk 150 feet activity did not occur: Safety/medical concerns         Walk 10 feet on uneven surface  activity   Assist Walk 10 feet on uneven surfaces activity did not occur: Safety/medical concerns          Wheelchair     Assist Will patient use wheelchair at discharge?: Yes Type of Wheelchair: Manual    Wheelchair assist level: Supervision/Verbal cueing Max wheelchair distance: 157f    Wheelchair 50 feet with 2 turns activity    Assist        Assist Level: Supervision/Verbal cueing   Wheelchair 150 feet activity     Assist  Wheelchair 150 feet activity did not occur: Safety/medical concerns   Assist Level: Supervision/Verbal cueing   Blood pressure 100/65, pulse 78, temperature 98 F (36.7 C), resp. rate 18, height '5\' 4"'$  (1.626 m), weight 80.5 kg, SpO2 100 %.  Medical Problem List and Plan: 1.  Debility secondary to gangrenous left foot.  Status post left BKA 06/27/2021.                -patient may not shower             -ELOS/Goals: 8-10 days mod I  -Continue CIR therapies including PT, OT  2.  Impaired mobility, ambulating <50 feet: continue Lovenox for DVT prophylaxis 3. Residual limb pain: wean methocarbamol to '250mg'$  BID given effects on cognition. Change oxycodone to Norco given nausea with former.  4. Mood: Melatonin nightly as needed.  Provide emotional support             -antipsychotic agents: Latuda 40 mg daily 5. Neuropsych: This patient is capable of making decisions on her own behalf. 6. Skin/Wound Care: wound vac thru 7/30---> removed vac today  -wound looks well enough that we can just used silver shrinker alone. Will. Ask hanger for more shrinkers  -limb guard  -eucerin cream to right foot 7. Fluids/Electrolytes/Nutrition: encourage PO  -I personally reviewed the patient's labs today.   8.  Acute on chronic anemia.     -no gross bleeding on exam  -hgb up to  8.1 on 8/1 9.  Diabetes mellitus with peripheral neuropathy. Increase glargine to 44U. Outpatient Qutenza. Provided list of foods that are good for diabetes.    CBG (last 3)  Recent Labs    07/07/21 2116 07/08/21 0543 07/08/21 1138  GLUCAP 246* 173* 229*  10.  Hyperlipidemia.   Continue Crestor 11.  AKI on CKD.  Cr down to 1.11. Encourage 6-8 glasses of water per day.   12.  Obesity.  BMI 34.33.  Dietary follow-up 13.  Hypertension.  Continue Hygroton 25 mg daily. 14.  Diastolic congestive heart failure.  Monitor for any signs of fluid overload. Weights stable, continue to monitor.  Filed Weights   07/06/21 0353 07/07/21 0500 07/08/21 0605  Weight: 83.6 kg 82 kg 80.5 kg   Weights balanced 15.  History of pancreatitis.  Continue Creon 16.  History  of alcohol tobacco abuse.  Counseling 17. Leukocytosis: wbcs down to 12.5--> 10.0 8/1    LOS: 7 days A FACE TO FACE EVALUATION WAS PERFORMED  Martha Clan P Venetia Prewitt 07/08/2021, 2:12 PM

## 2021-07-08 NOTE — Progress Notes (Signed)
Occupational Therapy Session Note  Patient Details  Name: Sarah Long MRN: [id number] Date of Birth: 1971/06/16  Today's Date: 07/08/2021 OT Individual Time: 1105-1201 OT Individual Time Calculation (min): 56 min    Short Term Goals: Week 1:  OT Short Term Goal 1 (Week 1): Pt will perform LB bathing with use of LHS with CGA OT Short Term Goal 2 (Week 1): Pt will perform LB dressing with AE PRN with CGA OT Short Term Goal 3 (Week 1): Pt will complete ADL/activity of choice for 10 mins without rest break OT Short Term Goal 4 (Week 1): Pt will perform BSC/toilet transfer with CGA/close supervision and LRAD  Skilled Therapeutic Interventions/Progress Updates:    Treatment session with focus on self-care retraining, functional transfers, dynamic standing balance, and care for residual limb.  Pt received sidelying in bed expressing desire to shower.  Pt completed bed mobility with supervision and completed stand pivot transfer to w/c with RW with CGA.  Pt completed stand pivot w/c > tub bench in room shower with close supervision.  Pt engaged in bathing from tub bench with lateral leans with supervision and CGA when standing to wash buttocks.  Pt able to complete UB dressing with setup, requiring assist to pull shirt down over trunk due to tight fitting shirt.  Pt donned underwear and pants at sit > stand level with CGA when standing to pull pants over hips.  Therapist educating pt on balance reactions and dynamic standing during LB dressing.  Pt donned gray shrinker overtop of black shrinker with ability to don without assistance.  Pt attempted to don limb guard but in unable to decipher end of velcro due to lack of contrast.  Therapist plans to provide contrast stripping during next session to increase success.  Pt remained upright in w/c with chair alarm on and all needs in reach.  Therapy Documentation Precautions:  Precautions Precautions: Fall Precaution Comments: L BKA Required Braces or  Orthoses: Other Brace Other Brace: residual limb guard Restrictions Weight Bearing Restrictions: Yes LLE Weight Bearing: Non weight bearing Other Position/Activity Restrictions: no orders related to RIGHT foot per chart  Pain:  Pt with no c/o pain   Therapy/Group: Individual Therapy  Simonne Come 07/08/2021, 12:13 PM

## 2021-07-08 NOTE — Progress Notes (Signed)
Physical Therapy Session Note  Patient Details  Name: Sarah Long MRN: [id number] Date of Birth: June 01, 1971  Today's Date: 07/08/2021 PT Individual Time: 0800-0857 PT Individual Time Calculation (min): 57 min    Today's Date: 07/08/2021 PT Missed Time: 18 Minutes Missed Time Reason: Patient ill (Comment) (nausea)  Short Term Goals: Week 1:  PT Short Term Goal 1 (Week 1): Patient will perform squat pivot transfers with S PT Short Term Goal 2 (Week 1): Patient will ambulated 59' with CGA with RW PT Short Term Goal 3 (Week 1): Patient will perform w/c mobility S over level indoor surfaces 150' PT Short Term Goal 4 (Week 1): Patient to perform standing balance activity x 2 minutes prior to needing seated rest.  Skilled Therapeutic Interventions/Progress Updates:   Received pt sitting in WC, pt agreeable to PT treatment, and denied any pain during session but reported feeling extremely nauseous (possibly from taking pain medication on an empty stomach last night). RN present to administer medications and insulin. Pt then with 1 episode of emesis, disgorging all medications she just swallowed but motivated to attempt to participate in therapy as much as possible. Pt able to don limb guard with min A and R shoe with max A. Pt transported to dayroom in [facility] total A for energy conservation purposes and pt transferred WC<>Nustep with RW and close supervision. Pt began on Nustep for a few seconds prior to feeling another urge to vomit (therefore took extended rest break then pt ultimately requested to return to room). Pt became emotional and tearful that she was missing therapy time from feeling so sick; provided emotional support and reassurance. Pt transferred Nustep<>WC stand<>pivot with RW and supervision and transported back to room in [facility] total A. Stand<>pivot WC<>bed with RW and CGA and sit<>supine with supervision. MD present for morning rounds and updated on pt's current symptoms. MD to change pain  medications to see if nausea resolves. Provided pt with HEP and educated pt on frequency/duration/technique for the following exercises: -Prone Hip Extension with Residual Limb (BKA) - 1 x daily - 7 x weekly - 3 sets - 8 reps -Sidelying Hip Abduction (AKA) - 1 x daily - 7 x weekly - 3 sets - 8 reps -Supine Active Straight Leg Raise - 1 x daily - 7 x weekly - 2 sets - 10 reps -Supine Hip Abduction AROM - 1 x daily - 7 x weekly - 2 sets - 10 reps Concluded session with pt supine in bed, needs within reach, and bed alarm on. Provided pt with saltine crackers and ginger ale. 18 minutes missed of skilled physical therapy due to nausea.   Therapy Documentation Precautions:  Precautions Precautions: Fall Precaution Comments: L BKA Required Braces or Orthoses: Other Brace Other Brace: residual limb guard Restrictions Weight Bearing Restrictions: Yes LLE Weight Bearing: Non weight bearing Other Position/Activity Restrictions: no orders related to RIGHT foot per chart  Therapy/Group: Individual Therapy Alfonse Alpers PT, DPT   07/08/2021, 7:17 AM

## 2021-07-08 NOTE — Progress Notes (Signed)
Occupational Therapy Session Note  Patient Details  Name: Sarah Long MRN: [id number] Date of Birth: 1971/11/30  Today's Date: 07/08/2021 OT Group Time: [id number] OT Group Time Calculation (min): 58 min   Short Term Goals: Week 1:  OT Short Term Goal 1 (Week 1): Pt will perform LB bathing with use of LHS with CGA OT Short Term Goal 2 (Week 1): Pt will perform LB dressing with AE PRN with CGA OT Short Term Goal 3 (Week 1): Pt will complete ADL/activity of choice for 10 mins without rest break OT Short Term Goal 4 (Week 1): Pt will perform BSC/toilet transfer with CGA/close supervision and LRAD  Skilled Therapeutic Interventions/Progress Updates:  Pt participated in group session with a focus on BUE strength and endurance. Session started off with warm up ball tosses to other members in group ~ 5 mins. Pt completed seated BUE therex with level 2 HEP including bicep curls, chest pulls, shoulder flexion, tricep extensions, punches. Issued pt written HEP to increase carryover. Pt additionally participated in [place] therex with no weight to beat of music including bicep curls, chest presses, shoulder flexion, and punches. Pt additionally completed ball passes around group circle with 1 lb weighted ball . Pt engaged in social interaction by stating one happy thing that happened to her today " I was able to take a shower today" as well as one goal that she was excited to work towards with therapy, "walking more." Pt transported back to room by RT.   Therapy Documentation Precautions:  Precautions Precautions: Fall Precaution Comments: L BKA Required Braces or Orthoses: Other Brace Other Brace: residual limb guard Restrictions Weight Bearing Restrictions: Yes LLE Weight Bearing: Non weight bearing Other Position/Activity Restrictions: no orders related to RIGHT foot per chart  Pain: Pt reports no pain during session.    Therapy/Group: Group Therapy  Precious Haws 07/08/2021, 3:51 PM

## 2021-07-09 LAB — GLUCOSE, CAPILLARY
Glucose-Capillary: 182 mg/dL — ABNORMAL HIGH (ref 70–99)
Glucose-Capillary: 135 mg/dL — ABNORMAL HIGH (ref 70–99)
Glucose-Capillary: 181 mg/dL — ABNORMAL HIGH (ref 70–99)
Glucose-Capillary: 239 mg/dL — ABNORMAL HIGH (ref 70–99)

## 2021-07-09 MED ORDER — INSULIN GLARGINE-YFGN 100 UNIT/ML ~~LOC~~ SOLN
45.0000 [IU] | Freq: Every day | SUBCUTANEOUS | Status: DC
  Administered 2021-07-10 – 2021-07-15 (×6): 45 [IU] via SUBCUTANEOUS
  Filled 2021-07-09 (×6): qty 0.45

## 2021-07-09 MED ORDER — HYDROCODONE-ACETAMINOPHEN 5-325 MG PO TABS
1.0000 | ORAL_TABLET | Freq: Three times a day (TID) | ORAL | Status: DC | PRN
  Administered 2021-07-10 – 2021-07-12 (×5): 1 via ORAL
  Filled 2021-07-09 (×5): qty 1

## 2021-07-09 MED ORDER — INSULIN GLARGINE-YFGN 100 UNIT/ML ~~LOC~~ SOLN: 43.0000 [IU] | Freq: Every day | SUBCUTANEOUS | Status: DC

## 2021-07-09 NOTE — Progress Notes (Addendum)
PROGRESS NOTE   Subjective/Complaints: Doing great this morning but had another episode of emesis with PT later in the day. Minimize use of opioids as much as possible. Will add TID 1,'000mg'$  Tylenol- LFTs stable, repeat Monday.   ROS: Patient denies fever, rash, sore throat, blurred vision, vomiting, diarrhea, cough, shortness of breath or chest pain, headache, or mood change, +residual limb pain, nausea resolved  Objective:   No results found. No results for input(s): WBC, HGB, HCT, PLT in the last 72 hours.   No results for input(s): NA, K, CL, CO2, GLUCOSE, BUN, CREATININE, CALCIUM in the last 72 hours.    Intake/Output Summary (Last 24 hours) at 07/09/2021 1216 Last data filed at 07/09/2021 0743 Gross per 24 hour  Intake 590 ml  Output --  Net 590 ml        Physical Exam: Vital Signs Blood pressure 135/82, pulse 86, temperature 98.2 F (36.8 C), resp. rate 18, height '5\' 4"'$  (1.626 m), weight 80.2 kg, SpO2 99 %. Gen: no distress, normal appearing HEENT: oral mucosa pink and moist, NCAT Cardio: Reg rate Chest: normal effort, normal rate of breathing Abd: soft, non-distended Ext: no edema Psych: pleasant, normal affect Skin: left stump vac dressing removed. Wound well approximated and dry with staples. Skin remains extremely dry and cracked right foot/toes. Neuro: Pt is cognitively appropriate with normal insight, memory, and awareness. Seems fairly lucent to me. Cranial nerves 2-12 are intact. Sensory exam is normal. Reflexes are 2+ in all 4's. Fine motor coordination is intact. No tremors. Motor function is grossly 5/5 in UE. Can lift LLE off bed. RLE 4/5. [name]  Musculoskeletal: Full ROM, No pain with AROM or PROM in the neck, trunk, or extremities. Posture appropriate. Left stump still tender   Assessment/Plan: 1. Functional deficits which require 3+ hours per day of interdisciplinary therapy in a comprehensive inpatient  rehab setting. Physiatrist is providing close team supervision and 24 hour management of active medical problems listed below. Physiatrist and rehab team continue to assess barriers to discharge/monitor patient progress toward functional and medical goals  Care Tool:  Bathing    Body parts bathed by patient: Right arm, Left arm, Chest, Abdomen, Front perineal area, Buttocks, Right upper leg, Left upper leg, Right lower leg, Face   Body parts bathed by helper: Right lower leg Body parts n/a: Left lower leg   Bathing assist Assist Level: Supervision/Verbal cueing     Upper Body Dressing/Undressing Upper body dressing   What is the patient wearing?: Pull over shirt, Bra    Upper body assist Assist Level: Minimal Assistance - Patient > 75%    Lower Body Dressing/Undressing Lower body dressing      What is the patient wearing?: Pants, Underwear/pull up, Orthosis     Lower body assist Assist for lower body dressing: Minimal Assistance - Patient > 75%     Toileting Toileting    Toileting assist Assist for toileting: Minimal Assistance - Patient > 75%     Transfers Chair/bed transfer  Transfers assist     Chair/bed transfer assist level: Supervision/Verbal cueing     Locomotion Ambulation   Ambulation assist      Assist  level: Contact Guard/Touching assist Assistive device: Walker-rolling Max distance: 20   Walk 10 feet activity   Assist     Assist level: Contact Guard/Touching assist Assistive device: Walker-rolling   Walk 50 feet activity   Assist Walk 50 feet with 2 turns activity did not occur: Safety/medical concerns         Walk 150 feet activity   Assist Walk 150 feet activity did not occur: Safety/medical concerns         Walk 10 feet on uneven surface  activity   Assist Walk 10 feet on uneven surfaces activity did not occur: Safety/medical concerns         Wheelchair     Assist Will patient use wheelchair at  discharge?: Yes Type of Wheelchair: Manual    Wheelchair assist level: Supervision/Verbal cueing Max wheelchair distance: 113f    Wheelchair 50 feet with 2 turns activity    Assist        Assist Level: Supervision/Verbal cueing   Wheelchair 150 feet activity     Assist  Wheelchair 150 feet activity did not occur: Safety/medical concerns   Assist Level: Supervision/Verbal cueing   Blood pressure 135/82, pulse 86, temperature 98.2 F (36.8 C), resp. rate 18, height '5\' 4"'$  (1.626 m), weight 80.2 kg, SpO2 99 %.  Medical Problem List and Plan: 1.  Debility secondary to gangrenous left foot.  Status post left BKA 06/27/2021.                -patient may not shower             -ELOS/Goals: 8-10 days mod I  -Continue CIR therapies including PT, OT  2.  Impaired mobility, ambulating <50 feet: continue Lovenox for DVT prophylaxis 3. Residual limb pain: wean methocarbamol to '250mg'$  BID given effects on cognition. Wean Norco to q8H prn.  Start tylenol 1000U TID- LFTs normal, repeat Monday.  4. Insomnia: Melatonin nightly as needed.  5. Neuropsych: This patient is capable of making decisions on her own behalf. 6. Skin/Wound Care: wound vac thru 7/30---> removed vac today  -wound looks well enough that we can just used silver shrinker alone. Will. Ask hanger for more shrinkers  -limb guard  -eucerin cream to right foot 7. Fluids/Electrolytes/Nutrition: encourage PO  -I personally reviewed the patient's labs today.   8.  Acute on chronic anemia.     -no gross bleeding on exam  -hgb up to  8.1 on 8/1 9.  Diabetes mellitus with peripheral neuropathy. Increase glargine to 45U [facility]patient [place]. Provided list of foods that are good for diabetes.    CBG (last 3)  Recent Labs    07/08/21 2100 07/09/21 0603 07/09/21 1152  GLUCAP 245* 182* 135*  10.  Hyperlipidemia.  Continue Crestor 11.  AKI on CKD.  Cr down to 1.11. Encourage 6-8 glasses of water per day.   12.  Obesity.  BMI  34.33.  Dietary follow-up 13.  Hypertension.  Continue Hygroton 25 mg daily. 14.  Diastolic congestive heart failure.  Monitor for any signs of fluid overload. Weights stable, continue to monitor.  Filed Weights   07/07/21 0500 07/08/21 0605 07/09/21 0516  Weight: 82 kg 80.5 kg 80.2 kg   Weights balanced 15.  History of pancreatitis.  Continue Creon 16.  History of alcohol tobacco abuse.  Counseling 17. Leukocytosis: wbcs down to 12.5--> 10.0 8/1 18. Bipolar disorder, type 1: continue Latuda.  19. Osteomyelitis right great toe and second toe: continue doxycycline until outpatient vascular  f/u.  20. Dysuria: check UA/UC today    LOS: 8 days A FACE TO FACE EVALUATION WAS PERFORMED  Martha Clan P Bronte Kropf 07/09/2021, 12:16 PM

## 2021-07-09 NOTE — Progress Notes (Signed)
Physical Therapy Weekly Progress Note  Patient Details  Name: Sarah Long MRN: [id number] Date of Birth: [birth year]  Beginning of progress report period: July 02, 2021 End of progress report period: July 09, 2021  Today's Date: 07/09/2021 PT Individual Time: 0998-3382 PT Individual Time Calculation (min): 69 min   Patient has met 2 of 4 short term goals. Pt demonstrates steady progress towards long term goals. Pt is currently able to perform bed mobility with supervision, stand<>pivot transfers with RW and close supervision, ambulate up to 66f with RW and CGA/min A, and perform WC mobility 1510fusing BUE and supervision. Pt continues to be limited by intermittent nausea in the mornings 2/2 pain medication, generalized weakness and deconditioning, and decreased standing balance.   Patient continues to demonstrate the following deficits muscle weakness, decreased cardiorespiratoy endurance, and decreased standing balance, decreased postural control, decreased balance strategies, and difficulty maintaining precautions and therefore will continue to benefit from skilled PT intervention to increase functional independence with mobility.  Patient progressing toward long term goals..  Continue plan of care.  PT Short Term Goals Week 1:  PT Short Term Goal 1 (Week 1): Patient will perform squat pivot transfers with S PT Short Term Goal 1 - Progress (Week 1): Progressing toward goal PT Short Term Goal 2 (Week 1): Patient will ambulated 5061with CGA with RW PT Short Term Goal 2 - Progress (Week 1): Progressing toward goal PT Short Term Goal 3 (Week 1): Patient will perform w/c mobility S over level indoor surfaces 150' PT Short Term Goal 3 - Progress (Week 1): Met PT Short Term Goal 4 (Week 1): Patient to perform standing balance activity x 2 minutes prior to needing seated rest. PT Short Term Goal 4 - Progress (Week 1): Met Week 2:  PT Short Term Goal 1 (Week 2): STG=LTG due to LOS  Skilled  Therapeutic Interventions/Progress Updates:  Ambulation/gait training;DME/adaptive equipment instruction;Balance/vestibular training;Community reintegration;Disease management/prevention;Neuromuscular re-education;Patient/family education;Skin care/wound management;Stair training;Therapeutic Exercise;UE/LE Coordination activities;Wheelchair propulsion/positioning;UE/LE Strength taining/ROM;Therapeutic Activities;Splinting/orthotics;Psychosocial support;Pain management;Functional mobility training;Discharge planning   Today's Interventions: Received pt supine in bed, pt agreeable to PT treatment, and denied any pain at rest, but reported intermittent phantom pain spasms during session. Session with emphasis on functional mobility/transfers, generalized strengthening, dynamic standing balance/coordination, gait training, toileting, and improved activity tolerance. Pt performed bed mobility with supervision and donned R shoe and limb guard with max A for time management purposes. Pt reported urge to urinate and transferred sit<>stand with RW and close supervision and ambulated 1045f 1 and [id number] with RW and min A to/from bathroom -cues to keep RW within BOS and to take smaller "hops" on RLE for safety. Pt required mod A for clothing management but able to void and perform peri-care standing with CGA. Pt [place] hands at sink then performed WC mobility [id number] BUE and supervision to therapy gym (limited by UE fatigue) and transferred stand<>pivot WC<>mat with RW and min A due to R lateral LOB. Worked on dynamic sitting balance and trunk control tossing 1.1lb ball against rebounder for 1 minute x 2 trials transitioning to lateral taps then bounce for additional 1 minute x 2 trials. Pt transferred sit<>prone on mat with supervision and performed the following exercises with supervision: -prone hip extension on LLE 2x8 -prone hamstring curls on RLE with 3lb ankle weight 3x8 -prone<>prone on  extended elbows 2x8 with 3-5 second hold -R sidelying L hip abduction 2x8 -supine SLR 2x10 bilaterally -supine hip abduction  2x10 bilaterally -supine single leg bridge with bolster under L residual limb 2x8 Of note, pt continues to require spontaneous rest breaks mid-exercise despite encouragement to try to make is through entire set. Pt transferred supine<>sitting EOM with supervision and worked on dynamic standing balance clipping/unclipping clothespins from basketball net with RUE and CGA for balance x 2 trials. Stand<>pivot mat<>WC with RW and close supervision. Pt transported back to room in [facility] total A. Concluded session with pt sitting in WC, needs within reach, and chair pad alarm on. Provided pt with fresh drink/snack.   Therapy Documentation Precautions:  Precautions Precautions: Fall Precaution Comments: L BKA Required Braces or Orthoses: Other Brace Other Brace: residual limb guard Restrictions Weight Bearing Restrictions: Yes LLE Weight Bearing: Non weight bearing Other Position/Activity Restrictions: no orders related to RIGHT foot per chart  Therapy/Group: Individual Therapy Alfonse Alpers PT, DPT   07/09/2021, 7:21 AM

## 2021-07-09 NOTE — Progress Notes (Signed)
Occupational Therapy Session Note  Patient Details  Name: Sarah Long MRN: [id number] Date of Birth: 1971/02/10  Today's Date: 07/09/2021 OT Individual Time: 1100-1155 OT Individual Time Calculation (min): 55 min    Short Term Goals: Week 1:  OT Short Term Goal 1 (Week 1): Pt will perform LB bathing with use of LHS with CGA OT Short Term Goal 2 (Week 1): Pt will perform LB dressing with AE PRN with CGA OT Short Term Goal 3 (Week 1): Pt will complete ADL/activity of choice for 10 mins without rest break OT Short Term Goal 4 (Week 1): Pt will perform BSC/toilet transfer with CGA/close supervision and LRAD  Skilled Therapeutic Interventions/Progress Updates:    Pt resting in w/c upon arrival. Pt requested to bathe at shower level this morning. Pt amb with RW to bathroom with CGA. After residual limb wrapped with plastic bags, pt completed bathing with supervision with lateral leans. LB dressing with sit<>stand from w/c with CGA. Pt required assistance donning LLE lim guard. Standing balance at sink with CGA to pull pants over hips. Discussed bathing arrangements at home. Pt tearful when talking about changes in her life. Therapeutic listening and emotional support provided. Encouragement provided. Pt hoping that she will be able to dance again. Pt remained in w/c with all needs within reach and seat alarm activated.  Therapy Documentation Precautions:  Precautions Precautions: Fall Precaution Comments: L BKA Required Braces or Orthoses: Other Brace Other Brace: residual limb guard Restrictions Weight Bearing Restrictions: Yes LLE Weight Bearing: Non weight bearing Other Position/Activity Restrictions: no orders related to RIGHT foot per chart   Pain:  Pt denies pain this morning     Therapy/Group: Individual Therapy  Leroy Libman 07/09/2021, 12:14 PM

## 2021-07-09 NOTE — Progress Notes (Signed)
Physical Therapy Session Note  Patient Details  Name: Sarah Long MRN: [id number] Date of Birth: December 30, 1970  Today's Date: 07/09/2021 PT Individual Time: 1455-1533 PT Individual Time Calculation (min): 38 min   Short Term Goals: Week 2:  PT Short Term Goal 1 (Week 2): STG=LTG due to LOS  Skilled Therapeutic Interventions/Progress Updates:     Pt received supine in bed and agrees to therapy. No complaint of pain. Supine to sit with bed features and cues on positioning. PT assists pt with donning R shoe and L limb protector while pt seated at EOB. Pt performs squat pivot transfer to WC with CGA. Pt self propels WC x50' with bilateral upper extremities and PT provides propulsion for remainder of trip to gym for energy conservation. Pt tasked with approaching mat and managing WC parts to safely transfer to mat table. Pt is able to complete with supervision. PT then educates pt on removing arm rest if needed, as pt performed a stand pivot transfer by choice. Pt verbalizes understanding. Pt taken to parallel bars. Sit to stand in parallel bars with CGA. Pt performs 3x15 slow minisquats with R leg, with PT providing CGA at hips for safety as tactile cue for improved contraction. Seated rest breaks between each bout. WC transport back to room. Left seated with alarm intact and all needs within reach.  Therapy Documentation Precautions:  Precautions Precautions: Fall Precaution Comments: L BKA Required Braces or Orthoses: Other Brace Other Brace: residual limb guard Restrictions Weight Bearing Restrictions: Yes LLE Weight Bearing: Non weight bearing Other Position/Activity Restrictions: no orders related to RIGHT foot per chart  Therapy/Group: Individual Therapy  Breck Coons, PT, DPT 07/09/2021, 4:21 PM

## 2021-07-10 ENCOUNTER — Ambulatory Visit (INDEPENDENT_AMBULATORY_CARE_PROVIDER_SITE_OTHER): Payer: Medicaid Other

## 2021-07-10 DIAGNOSIS — I442 Atrioventricular block, complete: Secondary | ICD-10-CM

## 2021-07-10 LAB — GLUCOSE, CAPILLARY
Glucose-Capillary: 259 mg/dL — ABNORMAL HIGH (ref 70–99)
Glucose-Capillary: 149 mg/dL — ABNORMAL HIGH (ref 70–99)
Glucose-Capillary: 169 mg/dL — ABNORMAL HIGH (ref 70–99)

## 2021-07-10 MED ORDER — ACETAMINOPHEN 500 MG PO TABS
1000.0000 mg | ORAL_TABLET | Freq: Three times a day (TID) | ORAL | Status: DC
  Administered 2021-07-10 – 2021-07-13 (×7): 1000 mg via ORAL
  Filled 2021-07-10 (×10): qty 2

## 2021-07-10 NOTE — Progress Notes (Signed)
Physical Therapy Session Note  Patient Details  Name: Sarah Long MRN: [id number] Date of Birth: 1971/10/29  Today's Date: 07/10/2021 PT Individual Time: 1015-1057 PT Individual Time Calculation (min): 42 min   Today's Date: 07/10/2021 PT Missed Time: 18 Minutes Missed Time Reason: Patient fatigue;Other (Comment) (nausea)  Short Term Goals: Week 1:  PT Short Term Goal 1 (Week 1): Patient will perform squat pivot transfers with S PT Short Term Goal 1 - Progress (Week 1): Progressing toward goal PT Short Term Goal 2 (Week 1): Patient will ambulated 15' with CGA with RW PT Short Term Goal 2 - Progress (Week 1): Progressing toward goal PT Short Term Goal 3 (Week 1): Patient will perform w/c mobility S over level indoor surfaces 150' PT Short Term Goal 3 - Progress (Week 1): Met PT Short Term Goal 4 (Week 1): Patient to perform standing balance activity x 2 minutes prior to needing seated rest. PT Short Term Goal 4 - Progress (Week 1): Met Week 2:  PT Short Term Goal 1 (Week 2): STG=LTG due to LOS  Skilled Therapeutic Interventions/Progress Updates:   Received pt sitting in WC, pt agreeable to PT treatment, and reported 6/10 pain in L residual limb (premedicated). Repositioning, rest breaks, and distraction done to reduce pain levels. Session with emphasis on functional mobility/transfers, toileting, and UE strengthening. Pt reported urge to use bathroom and transferred stand<>pivot to/from toilet with grab bar and CGA. Pt able to doff pants/underwear with CGA and void and with medium sized BM (NT made aware). Pt then suddenly with episode of emesis (RN made aware). Pt reported that she was able to make it through OT session this morning without getting sick but could feel it coming on prior to PT's arrival. Pt performed peri-care standing with CGA and sat in WC and washed hands with set up assist. Pt transported to ortho gym in [facility] total A for time management purposes and performed BUE strengthening  on UBE at level 3 alternating 1 minute forwards and 1 minute backwards for a total of 6 minutes with 2 rest breaks due to pain, nausea, and sudden onset of dizziness and increased fatigue. Pt reported feeling warm, therefore provided cool washcloth and upon returning pt tearful and emotional stating "I don't know why all this is happening" referring to pain, nausea, and dizziness. Provided therapeutic listening and emotional support and encouragement and pt reported dizziness symptoms resolved. Pt requested to return to room and transported back in [facility] total A. Stand<>pivot WC<>bed with RW and supervision and sit<>supine with supervision. Concluded session with pt supine in bed, needs within reach, and bed alarm on. Provided ginger ale and saltines. RN made aware of pt's current status. 18 minutes missed of skilled physical therapy due to fatigue and nausea.   Therapy Documentation Precautions:  Precautions Precautions: Fall Precaution Comments: L BKA Required Braces or Orthoses: Other Brace Other Brace: residual limb guard Restrictions Weight Bearing Restrictions: Yes LLE Weight Bearing: Non weight bearing Other Position/Activity Restrictions: no orders related to RIGHT foot per chart  Therapy/Group: Individual Therapy Sarah Long PT, DPT   07/10/2021, 7:33 AM

## 2021-07-10 NOTE — Progress Notes (Signed)
Occupational Therapy Weekly Progress Note  Patient Details  Name: Sarah Long MRN: [id number] Date of Birth: May 17, 1971  Beginning of progress report period: July 02, 2021 End of progress report period: July 10, 2021  Today's Date: 07/10/2021 OT Individual Time: 7035-0093 OT Individual Time Calculation (min): 62 min    Patient has met 4 of 4 short term goals.  Pt is making steady progress towards goals.  Pt is currently completing stand pivot transfers with supervision and ambulatory transfers with RW with CGA.  Pt is able to complete bathing in shower from shower bench with lateral leans with supervision.  Pt continues to require CGA for dynamic standing balance during LB dressing and physical assistance when donning residual limb guard and caring for residual limb.  Education ongoing about care for residual limb with pt still somewhat hesitant about caring for limb.  Patient continues to demonstrate the following deficits: muscle weakness, decreased cardiorespiratoy endurance, decreased motor planning, decreased problem solving and delayed processing, and decreased sitting balance, decreased standing balance, decreased postural control, and decreased balance strategies and therefore will continue to benefit from skilled OT intervention to enhance overall performance with BADL and iADL.  Patient progressing toward long term goals..  Continue plan of care.  OT Short Term Goals Week 1:  OT Short Term Goal 1 (Week 1): Pt will perform LB bathing with use of LHS with CGA OT Short Term Goal 1 - Progress (Week 1): Met OT Short Term Goal 2 (Week 1): Pt will perform LB dressing with AE PRN with CGA OT Short Term Goal 2 - Progress (Week 1): Met OT Short Term Goal 3 (Week 1): Pt will complete ADL/activity of choice for 10 mins without rest break OT Short Term Goal 3 - Progress (Week 1): Met OT Short Term Goal 4 (Week 1): Pt will perform BSC/toilet transfer with CGA/close supervision and LRAD OT  Short Term Goal 4 - Progress (Week 1): Met Week 2:  OT Short Term Goal 1 (Week 2): STG = LTGs due to remaining LOS  Skilled Therapeutic Interventions/Progress Updates:    Treatment session with focus on self-care retraining, functional transfers, and care for residual limb.  Pt received upright in bed expressing desire to shower.  Pt completed bed mobility supervision and sit > stand with RW with supervision.  Pt ambulated to room shower with CGA - close supervision.  Pt completed bathing from tub bench in room shower with lateral leans.  Pt transferred back to w/c after shower supervision.  Pt completed dressing at sit > stand level at sink with CGA when standing to pull pants over hips.  Pt continues to require assistance when donning limb guard.  Pt asking multiple questions about various leisure pursuits, typical recovery, and prosthesis training.  Therapist reached out to amputee peer support contact to have pt meet with amputee who can answer her questions/concerns from experience.  Pt remained upright in w/c with chair alarm on and all needs in reach.  Therapy Documentation Precautions:  Precautions Precautions: Fall Precaution Comments: L BKA Required Braces or Orthoses: Other Brace Other Brace: residual limb guard Restrictions Weight Bearing Restrictions: Yes LLE Weight Bearing: Non weight bearing Other Position/Activity Restrictions: no orders related to RIGHT foot per chart  Pain: Pain Assessment Pain Scale: 0-10 Pain Score: 8  Pain Location: Leg Pain Orientation: Left Pain Intervention(s): Medication (See eMAR)   Therapy/Group: Individual Therapy  Simonne Come 07/10/2021, 10:07 AM

## 2021-07-11 LAB — URINALYSIS, ROUTINE W REFLEX MICROSCOPIC
Bilirubin Urine: NEGATIVE
Glucose, UA: NEGATIVE mg/dL
Hgb urine dipstick: NEGATIVE
Ketones, ur: NEGATIVE mg/dL
Nitrite: NEGATIVE
Protein, ur: 30 mg/dL — AB
Specific Gravity, Urine: 1.009 (ref 1.005–1.030)
pH: 7 (ref 5.0–8.0)

## 2021-07-11 LAB — GLUCOSE, CAPILLARY
Glucose-Capillary: 137 mg/dL — ABNORMAL HIGH (ref 70–99)
Glucose-Capillary: 154 mg/dL — ABNORMAL HIGH (ref 70–99)
Glucose-Capillary: 199 mg/dL — ABNORMAL HIGH (ref 70–99)
Glucose-Capillary: 175 mg/dL — ABNORMAL HIGH (ref 70–99)

## 2021-07-11 NOTE — Progress Notes (Signed)
Occupational Therapy Session Note  Patient Details  Name: MONEE SIVERSON MRN: [id number] Date of Birth: 29-Nov-1971  Today's Date: 07/12/2021 OT Group Time: 1105-1200 OT Group Time Calculation (min): 55 min  Skilled Therapeutic Interventions/Progress Updates:    Pt engaged in therapeutic w/c level dance group focusing on patient choice, UE/LE strengthening, salience, activity tolerance, and social participation. Pt was guided through various dance-based exercises involving UEs/LEs and trunk. All music was selected by group members. Emphasis placed on activity tolerance and general strengthening/endurance. Pt participated very well in group, suggested music, and initiated taking rest breaks as needed. At end of session pt was escorted back to the room by RN.    Therapy Documentation Precautions:  Precautions Precautions: Fall Precaution Comments: L BKA Required Braces or Orthoses: Other Brace Other Brace: residual limb guard Restrictions Weight Bearing Restrictions: Yes LLE Weight Bearing: Non weight bearing Other Position/Activity Restrictions: no orders related to RIGHT foot per chart  Pain: no s/s pain during tx Pain Assessment Pain Scale: 0-10 Pain Score: 2  Pain Type: Acute pain Pain Location: Leg Pain Orientation: Right Pain Descriptors / Indicators: Aching Pain Onset: Gradual Pain Intervention(s): Medication (See eMAR) ADL: ADL Eating: Not assessed Grooming: Setup Upper Body Bathing: Supervision/safety Lower Body Bathing: Minimal assistance Upper Body Dressing: Setup Lower Body Dressing: Moderate assistance Toileting: Moderate assistance Toilet Transfer: Minimal assistance Toilet Transfer Method: Stand pivot Toilet Transfer Equipment: Bedside commode     Therapy/Group: Group Therapy  Glendy Barsanti A Sundiata Ferrick 07/12/2021, 12:42 PM

## 2021-07-11 NOTE — Progress Notes (Signed)
Physical Therapy Session Note  Patient Details  Name: Sarah Long MRN: [id number] Date of Birth: Oct 30, 1971  Today's Date: 07/11/2021 PT Individual Time: 0900-0958 PT Individual Time Calculation (min): 58 min   Short Term Goals: Week 1:  PT Short Term Goal 1 (Week 1): Patient will perform squat pivot transfers with S PT Short Term Goal 1 - Progress (Week 1): Progressing toward goal PT Short Term Goal 2 (Week 1): Patient will ambulated 16' with CGA with RW PT Short Term Goal 2 - Progress (Week 1): Progressing toward goal PT Short Term Goal 3 (Week 1): Patient will perform w/c mobility S over level indoor surfaces 150' PT Short Term Goal 3 - Progress (Week 1): Met PT Short Term Goal 4 (Week 1): Patient to perform standing balance activity x 2 minutes prior to needing seated rest. PT Short Term Goal 4 - Progress (Week 1): Met  Skilled Therapeutic Interventions/Progress Updates:  Pt was seen bedside in the am. Pt transferred to edge of bed with S. Pt transferred edge of bed to w/c with squat pivot transfers and S. Pt propelled w/c to gym with B UEs and S about 150 feet. In gym treatment focused on LE strengthening and ROM exercises. Doffed L shrinker, incision line dry and intact. Donned new shrinker. Discussed with patient in regards to wearing schedule of shrinker as well as residual limb care. Performed B hip flex, LAQs and L quad sets,  3 sets x 10 reps each. Pt returned to room and left sitting up in w/c with all needs within reach.   Therapy Documentation Precautions:  Precautions Precautions: Fall Precaution Comments: L BKA Required Braces or Orthoses: Other Brace Other Brace: residual limb guard Restrictions Weight Bearing Restrictions: Yes LLE Weight Bearing: Non weight bearing Other Position/Activity Restrictions: no orders related to RIGHT foot per chart General:   Pain: Pt c/o 4/10 pain L residual limb/      Therapy/Group: Individual Therapy  Dub Amis 07/11/2021, 11:13 AM

## 2021-07-11 NOTE — Progress Notes (Signed)
Physical Therapy Session Note  Patient Details  Name: Sarah Long MRN: [id number] Date of Birth: [birth year]  Today's Date: 07/11/2021 PT Individual Time: 7375-0510 PT Individual Time Calculation (min): 43 min   Short Term Goals: Week 1:  PT Short Term Goal 1 (Week 1): Patient will perform squat pivot transfers with S PT Short Term Goal 1 - Progress (Week 1): Progressing toward goal PT Short Term Goal 2 (Week 1): Patient will ambulated 2' with CGA with RW PT Short Term Goal 2 - Progress (Week 1): Progressing toward goal PT Short Term Goal 3 (Week 1): Patient will perform w/c mobility S over level indoor surfaces 150' PT Short Term Goal 3 - Progress (Week 1): Met PT Short Term Goal 4 (Week 1): Patient to perform standing balance activity x 2 minutes prior to needing seated rest. PT Short Term Goal 4 - Progress (Week 1): Met  Skilled Therapeutic Interventions/Progress Updates:  Pt was seen bedside in the pm. Pt performed bed mobility with S. Pt transferred w/c to bed, bed to w/c with S squat pivot transfer. Pt performed w/c mobility with B UEs and S. Performed 6 sets x 5 reps arm chair push ups. Pt returned to room and left sitting up in bed with all needs within reach.   Therapy Documentation Precautions:  Precautions Precautions: Fall Precaution Comments: L BKA Required Braces or Orthoses: Other Brace Other Brace: residual limb guard Restrictions Weight Bearing Restrictions: Yes LLE Weight Bearing: Non weight bearing Other Position/Activity Restrictions: no orders related to RIGHT foot per chart General:   Pain: No c/o pain.   Therapy/Group: Individual Therapy  Dub Amis 07/11/2021, 3:27 PM

## 2021-07-11 NOTE — Plan of Care (Signed)
  Problem: Consults Goal: RH LIMB LOSS PATIENT EDUCATION Description: Description: See Patient Education module for eduction specifics. Outcome: Progressing   Problem: RH BOWEL ELIMINATION Goal: RH STG MANAGE BOWEL WITH ASSISTANCE Description: STG Manage Bowel with mod I  Assistance. Outcome: Progressing Goal: RH STG MANAGE BOWEL W/MEDICATION W/ASSISTANCE Description: STG Manage Bowel with Medication with mod I Assistance. Outcome: Progressing   Problem: RH SKIN INTEGRITY Goal: RH STG SKIN FREE OF INFECTION/BREAKDOWN Description: Free from infection and wound healing at discharge Outcome: Progressing Goal: RH STG MAINTAIN SKIN INTEGRITY WITH ASSISTANCE Description: STG Maintain Skin Integrity With min Assistance. Outcome: Progressing Goal: RH STG ABLE TO PERFORM INCISION/WOUND CARE W/ASSISTANCE Description: STG Able To Perform Incision/Wound Care With min  Assistance. Outcome: Progressing   Problem: RH SAFETY Goal: RH STG ADHERE TO SAFETY PRECAUTIONS W/ASSISTANCE/DEVICE Description: STG Adhere to Safety Precautions With cues/remindersAssistance/Device. Outcome: Progressing   Problem: RH PAIN MANAGEMENT Goal: RH STG PAIN MANAGED AT OR BELOW PT'S PAIN GOAL Description: At or below level 4 Outcome: Progressing   Problem: RH KNOWLEDGE DEFICIT LIMB LOSS Goal: RH STG INCREASE KNOWLEDGE OF SELF CARE AFTER LIMB LOSS Description: Patient will be able to manage care at discharge using handouts and educational resources with cues/reminders Outcome: Progressing

## 2021-07-11 NOTE — Progress Notes (Addendum)
Physical Therapy Session Note  Patient Details  Name: Sarah Long MRN: [id number] Date of Birth: January 04, 1971  Today's Date: 07/11/2021 PT Individual Time:  1423-1535  PT Individual Time Calculation (min): 72 min    Short Term Goals: Week 1:  PT Short Term Goal 1 (Week 1): Patient will perform squat pivot transfers with S PT Short Term Goal 1 - Progress (Week 1): Progressing toward goal PT Short Term Goal 2 (Week 1): Patient will ambulated 59' with CGA with RW PT Short Term Goal 2 - Progress (Week 1): Progressing toward goal PT Short Term Goal 3 (Week 1): Patient will perform w/c mobility S over level indoor surfaces 150' PT Short Term Goal 3 - Progress (Week 1): Met PT Short Term Goal 4 (Week 1): Patient to perform standing balance activity x 2 minutes prior to needing seated rest. PT Short Term Goal 4 - Progress (Week 1): Met Week 2:  PT Short Term Goal 1 (Week 2): STG=LTG due to LOS  Skilled Therapeutic Interventions/Progress Updates:  Patient seated EOB on entrance to room. Patient alert and agreeable to PT session although she relates nausea that she has had all day. Also relates fatigue related to decreased sleep. Patient with no pain complaint throughout session.  Therapeutic Activity: Transfers: Patient performed STS and SPVT transfers throughout session with CGA/ supervision. One instance of descent to sit from far distance to seat and verbal cues provided for increased awareness of positioning, reaching back to seat prior to descent to sit. Improved performance throughout remainder of session.  Pt able to don limb protector with setup. No vc/ tc required. Pt relates that she has donned 2 shrinker socks in order to promote faster limb shaping as she is excited to initiate progress toward prosthetic shaping. Educated pt that healing will need to reach certain stage prior to appropriate time for prosthetic training.   Gait Training:  Patient ambulated 20' x3 spaced throughout  session using RW with CGA and hop-to gait pattern. Demonstrated some instances of increased step length. Provided vc/ tc for safe step length relating to foot position and hand position on RW. It foot position too far ahead of hand position, adjustment of AP balance and maintaining momentum may prove difficult. Demos understanding.  Pt guided in 4" curb step training to platform using RW to ascend/ descend. Visual demonstration with verbal instructions for sequencing and technique. Pt then able to return demo with consistent vc for technique throughout and good foot clearance when ascending. Able to perform up/ down in first bout with CGA/ Min A and then improves in return bout with CGA. Pt tolerates well and is very pleased with performance.   Wheelchair Mobility:  Patient propelled wheelchair 120' x1/ 78' x1 with supervision initially and fading to [place] by end of session. Provided vc for effort and hand position.Pt relates that she will not be using w/c within room at home but will need it to reach front desk from room as distance will be too far to walk. Educated at start of session on brake application, donning/ doffing leg rests and requires CGA to Mod A for leg rests.   Therapeutic Exercise: Patient performed tricep dips followed by minisquats at [facility] and is able to complete 1x15, then 1x10 of each with seated rest break between. Pt demonstrates supine SLR, abd// add, and heel slides for BLE and ankle pumps for RLE.  Patient seated in EOB at end of session with brakes locked, bed alarm set, and all needs  within reach.     Therapy Documentation Precautions:  Precautions Precautions: Fall Precaution Comments: L BKA Required Braces or Orthoses: Other Brace Other Brace: residual limb guard Restrictions Weight Bearing Restrictions: Yes LLE Weight Bearing: Non weight bearing Other Position/Activity Restrictions: no orders related to RIGHT foot per chart  Therapy/Group: Individual  Therapy  Alger Simons 07/11/2021, 5:32 AM

## 2021-07-11 NOTE — Progress Notes (Signed)
PROGRESS NOTE   Subjective/Complaints: Feeling well today. Asked why she's not getting that much food. Otherwise progressing with therapies  ROS: Patient denies fever, rash, sore throat, blurred vision, nausea, vomiting, diarrhea, cough, shortness of breath or chest pain,   headache, or mood change.  Objective:   No results found. No results for input(s): WBC, HGB, HCT, PLT in the last 72 hours.   No results for input(s): NA, K, CL, CO2, GLUCOSE, BUN, CREATININE, CALCIUM in the last 72 hours.    Intake/Output Summary (Last 24 hours) at 07/11/2021 0945 Last data filed at 07/11/2021 0900 Gross per 24 hour  Intake 712 ml  Output --  Net 712 ml        Physical Exam: Vital Signs Blood pressure 122/73, pulse (!) 102, temperature 97.9 F (36.6 C), resp. rate 18, height '5\' 4"'$  (1.626 m), weight 80.8 kg, SpO2 100 %. Constitutional: No distress . Vital signs reviewed. HEENT: NCAT, EOMI, oral membranes moist Neck: supple Cardiovascular: RRR without murmur. No JVD    Respiratory/Chest: CTA Bilaterally without wheezes or rales. Normal effort    GI/Abdomen: BS +, non-tender, non-distended Ext: no clubbing, cyanosis, or edema Psych: pleasant and cooperative  Skin: left stump vac dressing removed. Wound well approximated and dry with staples. Skin remains extremely dry and cracked right foot/toes. Neuro: Pt is cognitively appropriate with normal insight, memory, and awareness. Seems fairly lucent to me. Cranial nerves 2-12 are intact. Sensory exam is normal. Reflexes are 2+ in all 4's. Fine motor coordination is intact. No tremors. Motor function is grossly 5/5 in UE. Can lift LLE off bed. RLE 4/5. [name]  Musculoskeletal: Full ROM, No pain with AROM or PROM in the neck, trunk, or extremities. Posture appropriate. Left stump still tender   Assessment/Plan: 1. Functional deficits which require 3+ hours per day of interdisciplinary therapy in  a comprehensive inpatient rehab setting. Physiatrist is providing close team supervision and 24 hour management of active medical problems listed below. Physiatrist and rehab team continue to assess barriers to discharge/monitor patient progress toward functional and medical goals  Care Tool:  Bathing    Body parts bathed by patient: Right arm, Left arm, Chest, Abdomen, Front perineal area, Buttocks, Right upper leg, Left upper leg, Right lower leg, Face   Body parts bathed by helper: Right lower leg Body parts n/a: Left lower leg   Bathing assist Assist Level: Supervision/Verbal cueing     Upper Body Dressing/Undressing Upper body dressing   What is the patient wearing?: Pull over shirt, Bra    Upper body assist Assist Level: Minimal Assistance - Patient > 75%    Lower Body Dressing/Undressing Lower body dressing      What is the patient wearing?: Pants, Underwear/pull up, Orthosis     Lower body assist Assist for lower body dressing: Minimal Assistance - Patient > 75%     Toileting Toileting    Toileting assist Assist for toileting: Minimal Assistance - Patient > 75%     Transfers Chair/bed transfer  Transfers assist     Chair/bed transfer assist level: Supervision/Verbal cueing     Locomotion Ambulation   Ambulation assist      Assist  level: Contact Guard/Touching assist Assistive device: Walker-rolling Max distance: 20   Walk 10 feet activity   Assist     Assist level: Contact Guard/Touching assist Assistive device: Walker-rolling   Walk 50 feet activity   Assist Walk 50 feet with 2 turns activity did not occur: Safety/medical concerns         Walk 150 feet activity   Assist Walk 150 feet activity did not occur: Safety/medical concerns         Walk 10 feet on uneven surface  activity   Assist Walk 10 feet on uneven surfaces activity did not occur: Safety/medical concerns         Wheelchair     Assist Will patient  use wheelchair at discharge?: Yes Type of Wheelchair: Manual    Wheelchair assist level: Supervision/Verbal cueing Max wheelchair distance: 157f    Wheelchair 50 feet with 2 turns activity    Assist        Assist Level: Supervision/Verbal cueing   Wheelchair 150 feet activity     Assist  Wheelchair 150 feet activity did not occur: Safety/medical concerns   Assist Level: Supervision/Verbal cueing   Blood pressure 122/73, pulse (!) 102, temperature 97.9 F (36.6 C), resp. rate 18, height '5\' 4"'$  (1.626 m), weight 80.8 kg, SpO2 100 %.  Medical Problem List and Plan: 1.  Debility secondary to gangrenous left foot.  Status post left BKA 06/27/2021.                -patient may not shower             -ELOS/Goals: 8-10 days mod I  -Continue CIR therapies including PT, OT  2.  Impaired mobility, ambulating <50 feet: continue Lovenox for DVT prophylaxis 3. Residual limb pain: wean methocarbamol to '250mg'$  BID given effects on cognition. Wean Norco to q8H prn.  Start tylenol 1000U TID- LFTs normal, repeat Monday.  4. Insomnia: Melatonin nightly as needed.  5. Neuropsych: This patient is capable of making decisions on her own behalf. 6. Skin/Wound Care: shrinker  -limb guard  -eucerin cream to right foot 7. Fluids/Electrolytes/Nutrition: encourage PO  -I personally reviewed the patient's labs today.   8.  Acute on chronic anemia.     -no gross bleeding on exam  -hgb up to  8.1 on 8/1 9.  Diabetes mellitus with peripheral neuropathy. Increase glargine to 45U Outpatient Qutenza. Provided list of foods that are good for diabetes.    CBG (last 3)  Recent Labs    07/10/21 1608 07/10/21 2024 07/11/21 0521  GLUCAP 149* 169* 137*  10.  Hyperlipidemia.  Continue Crestor 11.  AKI on CKD.  Cr down to 1.11. Encourage 6-8 glasses of water per day.   12.  Obesity.  BMI 34.33.  Dietary follow-up 13.  Hypertension.  Continue Hygroton 25 mg daily. 14.  Diastolic congestive heart  failure.  Monitor for any signs of fluid overload. Weights stable, continue to monitor.  Filed Weights   07/09/21 0516 07/10/21 0500 07/11/21 0500  Weight: 80.2 kg 82 kg 80.8 kg   Weights balanced 8/6 15.  History of pancreatitis.  Continue Creon 16.  History of alcohol tobacco abuse.  Counseling 17. Leukocytosis: wbcs down to 12.5--> 10.0 8/1 18. Bipolar disorder, type 1: continue Latuda.  19. Osteomyelitis right great toe and second toe: continue doxycycline until outpatient vascular f/u.  20. Dysuria: UA +, UCX pending. Await cx    LOS: 10 days A FACE TO FACE EVALUATION WAS PERFORMED  Meredith Staggers 07/11/2021, 9:45 AM

## 2021-07-12 LAB — GLUCOSE, CAPILLARY
Glucose-Capillary: 139 mg/dL — ABNORMAL HIGH (ref 70–99)
Glucose-Capillary: 156 mg/dL — ABNORMAL HIGH (ref 70–99)
Glucose-Capillary: 129 mg/dL — ABNORMAL HIGH (ref 70–99)
Glucose-Capillary: 201 mg/dL — ABNORMAL HIGH (ref 70–99)

## 2021-07-12 LAB — RESP PANEL BY RT-PCR (FLU A&B, COVID) ARPGX2
Influenza A by PCR: NEGATIVE
Influenza B by PCR: NEGATIVE
SARS Coronavirus 2 by RT PCR: NEGATIVE

## 2021-07-12 LAB — URINE CULTURE: Culture: <10000 — AB

## 2021-07-12 NOTE — Plan of Care (Signed)
  Problem: Consults Goal: RH LIMB LOSS PATIENT EDUCATION Description: Description: See Patient Education module for eduction specifics. Outcome: Progressing   Problem: RH BOWEL ELIMINATION Goal: RH STG MANAGE BOWEL WITH ASSISTANCE Description: STG Manage Bowel with mod I  Assistance. Outcome: Progressing Goal: RH STG MANAGE BOWEL W/MEDICATION W/ASSISTANCE Description: STG Manage Bowel with Medication with mod I Assistance. Outcome: Progressing   Problem: RH SKIN INTEGRITY Goal: RH STG SKIN FREE OF INFECTION/BREAKDOWN Description: Free from infection and wound healing at discharge Outcome: Progressing Goal: RH STG MAINTAIN SKIN INTEGRITY WITH ASSISTANCE Description: STG Maintain Skin Integrity With min Assistance. Outcome: Progressing Goal: RH STG ABLE TO PERFORM INCISION/WOUND CARE W/ASSISTANCE Description: STG Able To Perform Incision/Wound Care With min  Assistance. Outcome: Progressing   Problem: RH SAFETY Goal: RH STG ADHERE TO SAFETY PRECAUTIONS W/ASSISTANCE/DEVICE Description: STG Adhere to Safety Precautions With cues/remindersAssistance/Device. Outcome: Progressing   Problem: RH PAIN MANAGEMENT Goal: RH STG PAIN MANAGED AT OR BELOW PT'S PAIN GOAL Description: At or below level 4 Outcome: Progressing   Problem: RH KNOWLEDGE DEFICIT LIMB LOSS Goal: RH STG INCREASE KNOWLEDGE OF SELF CARE AFTER LIMB LOSS Description: Patient will be able to manage care at discharge using handouts and educational resources with cues/reminders Outcome: Progressing

## 2021-07-12 NOTE — Progress Notes (Signed)
Patient covid test negative. Asymptomatic. Spoke with Barbaraann Cao RN infection prevention. Ok to take patient off isolation precautions.

## 2021-07-13 ENCOUNTER — Telehealth (HOSPITAL_COMMUNITY): Payer: Self-pay | Admitting: Critical Care Medicine

## 2021-07-13 LAB — COMPREHENSIVE METABOLIC PANEL
ALT: 11 U/L (ref 0–44)
AST: 22 U/L (ref 15–41)
Albumin: 2.2 g/dL — ABNORMAL LOW (ref 3.5–5.0)
Alkaline Phosphatase: 71 U/L (ref 38–126)
Anion gap: 8 (ref 5–15)
BUN: 15 mg/dL (ref 6–20)
CO2: 25 mmol/L (ref 22–32)
Calcium: 8.7 mg/dL — ABNORMAL LOW (ref 8.9–10.3)
Chloride: 103 mmol/L (ref 98–111)
Creatinine, Ser: 1.05 mg/dL — ABNORMAL HIGH (ref 0.44–1.00)
GFR, Estimated: >60 mL/min (ref >60)
Glucose, Bld: 138 mg/dL — ABNORMAL HIGH (ref 70–99)
Potassium: 4.6 mmol/L (ref 3.5–5.1)
Sodium: 136 mmol/L (ref 135–145)
Total Bilirubin: 0.3 mg/dL (ref 0.3–1.2)
Total Protein: 7.3 g/dL (ref 6.5–8.1)

## 2021-07-13 LAB — GLUCOSE, CAPILLARY
Glucose-Capillary: 149 mg/dL — ABNORMAL HIGH (ref 70–99)
Glucose-Capillary: 173 mg/dL — ABNORMAL HIGH (ref 70–99)
Glucose-Capillary: 152 mg/dL — ABNORMAL HIGH (ref 70–99)
Glucose-Capillary: 215 mg/dL — ABNORMAL HIGH (ref 70–99)

## 2021-07-13 MED ORDER — HYDROCODONE-ACETAMINOPHEN 5-325 MG PO TABS
1.0000 | ORAL_TABLET | Freq: Two times a day (BID) | ORAL | Status: DC | PRN
Start: 2021-07-13 — End: 2021-07-14
  Administered 2021-07-13: 1 via ORAL
  Filled 2021-07-13: qty 1

## 2021-07-13 MED ORDER — ACETAMINOPHEN 325 MG PO TABS
650.0000 mg | ORAL_TABLET | ORAL | Status: DC | PRN
  Administered 2021-07-13 – 2021-07-15 (×3): 650 mg via ORAL
  Filled 2021-07-13 (×3): qty 2

## 2021-07-13 MED ORDER — FLUCONAZOLE 200 MG PO TABS
200.0000 mg | ORAL_TABLET | Freq: Every day | ORAL | Status: DC
  Filled 2021-07-13: qty 1

## 2021-07-13 NOTE — Progress Notes (Signed)
PROGRESS NOTE   Subjective/Complaints: UA+ budding, UC with insignificant growth- will start fluconazole per pharmacy consult.  CBGs still slightly over 200. Will not increase glargine further since above recommended dose  ROS: Patient denies fever, rash, sore throat, blurred vision, nausea, vomiting, diarrhea, cough, shortness of breath or chest pain, headache, or mood change. +dysuria-improving  Objective:   No results found. No results for input(s): WBC, HGB, HCT, PLT in the last 72 hours.   No results for input(s): NA, K, CL, CO2, GLUCOSE, BUN, CREATININE, CALCIUM in the last 72 hours.    Intake/Output Summary (Last 24 hours) at 07/13/2021 0715 Last data filed at 07/12/2021 1808 Gross per 24 hour  Intake 480 ml  Output --  Net 480 ml        Physical Exam: Vital Signs Blood pressure 122/71, pulse 80, temperature 97.9 F (36.6 C), resp. rate 17, height '5\' 4"'$  (1.626 m), weight 80.1 kg, SpO2 100 %. Gen: no distress, normal appearing HEENT: oral mucosa pink and moist, NCAT Cardio: Reg rate Chest: normal effort, normal rate of breathing Abd: soft, non-distended Ext: no edema Psych: pleasant, normal affect  Skin: left stump vac dressing removed. Wound well approximated and dry with staples. Skin remains extremely dry and cracked right foot/toes. Neuro: Pt is cognitively appropriate with normal insight, memory, and awareness. Seems fairly lucent to me. Cranial nerves 2-12 are intact. Sensory exam is normal. Reflexes are 2+ in all 4's. Fine motor coordination is intact. No tremors. Motor function is grossly 5/5 in UE. Can lift LLE off bed. RLE 4/5. [name]  Musculoskeletal: Full ROM, No pain with AROM or PROM in the neck, trunk, or extremities. Posture appropriate. Left stump still tender   Assessment/Plan: 1. Functional deficits which require 3+ hours per day of interdisciplinary therapy in a comprehensive inpatient rehab  setting. Physiatrist is providing close team supervision and 24 hour management of active medical problems listed below. Physiatrist and rehab team continue to assess barriers to discharge/monitor patient progress toward functional and medical goals  Care Tool:  Bathing    Body parts bathed by patient: Right arm, Left arm, Chest, Abdomen, Front perineal area, Buttocks, Right upper leg, Left upper leg, Right lower leg, Face   Body parts bathed by helper: Right lower leg Body parts n/a: Left lower leg   Bathing assist Assist Level: Supervision/Verbal cueing     Upper Body Dressing/Undressing Upper body dressing   What is the patient wearing?: Pull over shirt, Bra    Upper body assist Assist Level: Minimal Assistance - Patient > 75%    Lower Body Dressing/Undressing Lower body dressing      What is the patient wearing?: Pants, Underwear/pull up, Orthosis     Lower body assist Assist for lower body dressing: Minimal Assistance - Patient > 75%     Toileting Toileting    Toileting assist Assist for toileting: Minimal Assistance - Patient > 75%     Transfers Chair/bed transfer  Transfers assist     Chair/bed transfer assist level: Supervision/Verbal cueing     Locomotion Ambulation   Ambulation assist      Assist level: Contact Guard/Touching assist Assistive device: Walker-rolling Max distance:  20   Walk 10 feet activity   Assist     Assist level: Contact Guard/Touching assist Assistive device: Walker-rolling   Walk 50 feet activity   Assist Walk 50 feet with 2 turns activity did not occur: Safety/medical concerns         Walk 150 feet activity   Assist Walk 150 feet activity did not occur: Safety/medical concerns         Walk 10 feet on uneven surface  activity   Assist Walk 10 feet on uneven surfaces activity did not occur: Safety/medical concerns         Wheelchair     Assist Will patient use wheelchair at discharge?:  Yes Type of Wheelchair: Manual    Wheelchair assist level: Supervision/Verbal cueing Max wheelchair distance: 200    Wheelchair 50 feet with 2 turns activity    Assist        Assist Level: Supervision/Verbal cueing   Wheelchair 150 feet activity     Assist  Wheelchair 150 feet activity did not occur: Safety/medical concerns   Assist Level: Supervision/Verbal cueing   Blood pressure 122/71, pulse 80, temperature 97.9 F (36.6 C), resp. rate 17, height '5\' 4"'$  (1.626 m), weight 80.1 kg, SpO2 100 %.  Medical Problem List and Plan: 1.  Debility secondary to gangrenous left foot.  Status post left BKA 06/27/2021.                -patient may not shower             -ELOS/Goals: 8-10 days mod I  -Continue CIR therapies including PT, OT  2.  Impaired mobility, ambulating <50 feet: continue Lovenox for DVT prophylaxis 3. Residual limb pain: wean methocarbamol to '250mg'$  BID given effects on cognition. Wean Norco to q12H prn.  Start tylenol 1000U TID- LFTs normal, repeat Monday. F/u CMP 4. Insomnia: Melatonin nightly as needed.  5. Neuropsych: This patient is capable of making decisions on her own behalf. 6. Skin/Wound Care: shrinker  -limb guard  -eucerin cream to right foot 7. Fluids/Electrolytes/Nutrition: encourage PO  -I personally reviewed the patient's labs today.   8.  Acute on chronic anemia.     -no gross bleeding on exam  -hgb up to  8.1 on 8/1 9.  Diabetes mellitus with peripheral neuropathy. Increase glargine to 45U. Outpatient Qutenza. Provided list of foods that are good for diabetes.  Cannot increase Glargine further- can consider oral medications  CBG (last 3)  Recent Labs    07/12/21 1611 07/12/21 2113 07/13/21 0536  GLUCAP 129* 201* 149*  10.  Hyperlipidemia.  Continue Crestor 11.  AKI on CKD.  Cr down to 1.11. Encourage 6-8 glasses of water per day.   12.  Obesity.  BMI 34.33.  Dietary follow-up 13.  Hypertension.  Continue Hygroton 25 mg daily. 14.   Diastolic congestive heart failure.  Monitor for any signs of fluid overload. Weights stable, continue to monitor.  Filed Weights   07/11/21 0500 07/12/21 0500 07/13/21 0344  Weight: 80.8 kg 80.1 kg 80.1 kg   Weights balanced 8/8 15.  History of pancreatitis.  Continue Creon 16.  History of alcohol tobacco abuse.  Counseling 17. Leukocytosis: wbcs down to 12.5--> 10.0 8/1 18. Bipolar disorder, type 1: continue Latuda.  19. Osteomyelitis right great toe and second toe: continue doxycycline until outpatient vascular f/u.  20. Dysuria: UC with insignificant growth. UA with budding yeast- discussed with patient and pharmacy starting fluconazole (Monostat not helping much)- but [place]  dose would have to be decreased given accumulative risk of Long QT syndrome- symptoms improving so will hold off for now    LOS: 12 days A FACE TO FACE EVALUATION WAS PERFORMED  Martha Clan P Joley Utecht 07/13/2021, 7:15 AM

## 2021-07-13 NOTE — Progress Notes (Signed)
Physical Therapy Discharge Summary  Patient Details  Name: Sarah Long MRN: [id number] Date of Birth: [birth year]  Today's Date: 07/14/2021 PT Individual Time: 1100-1156 PT Individual Time Calculation (min): 56 min   Patient has met 9 of 10 long term goals due to improved activity tolerance, improved balance, improved postural control, increased strength, ability to compensate for deficits, improved awareness, and improved coordination.  Patient to discharge at a wheelchair level Modified Independent. Pt's family/friends did not attend family education training as per pt, no one was available. However, pt has verbalized and demonstrated confidence with tasks for discharge home.   Reasons goals not met: Pt did not meet ambulation goal of 44ft with supervision as pt is currently only able to ambulate up to 5ft with RW and close supervision due to impaired sensation and skin integrity on contralateral foot, decreased balance, generalized weakness and deconditioning, and need for occasional cues for RW safety.   Recommendation:  Patient will benefit from ongoing skilled PT services in home health setting to continue to advance safe functional mobility, address ongoing impairments in transfers, generalized strengthening, amputee education, dynamic standing balance/coordination, gait training, endurance, contralateral limb care, and to minimize fall risk.  Equipment: RW, 18x18 manual WC with L amputee support pad and L standard legrest.   Reasons for discharge: treatment goals met  Patient/family agrees with progress made and goals achieved: Yes   Today's Interventions: Received pt sitting in WC, pt agreeable to PT treatment, and denied any pain during session. Session with emphasis on discharge planning, functional mobility/transfers, generalized strengthening, dynamic standing balance/coordination, simulated car transfers, gait training, and improved activity tolerance. Pt performed WC mobility  169ft mod I using BUE to ortho gym. Pt performed simulated car transfer stand<>pivot with RW and supervision. Sit<>stand mod I with RW and ambulated 75ft with RW and close supervision -min cues to take smaller hops with RLE for safety. Discussed necessary equipment for discharge with CSW and expectations for tomorrow. Educated pt on need for MD approval before returning to driving and requirement for pt to have someone with her when going out in the community upon D/C; pt verbalized understanding. Pt performed the following exercises sitting in WC with supervision and verbal cues for technique: -WC pushups 2x8 -LAQ with 3lb ankle weight 2x10 on RLE -hip flexion with 3lb ankle weight 2x10 on RLE Pt transported back to room in [facility] total A. MD present to discuss some of pt's concerns regarding staple removal. Concluded session with pt sitting in WC, needs within reach, and chair pad alarm on.   PT Discharge Precautions/Restrictions Precautions Precautions: Fall Precaution Comments: L BKA Required Braces or Orthoses: Other Brace Other Brace: residual limb guard Restrictions Weight Bearing Restrictions: Yes LLE Weight Bearing: Non weight bearing Cognition Overall Cognitive Status: Within Functional Limits for tasks assessed Arousal/Alertness: Awake/alert Orientation Level: Oriented X4 Memory: Appears intact Awareness: Appears intact Problem Solving: Appears intact Safety/Judgment: Appears intact Sensation Sensation Light Touch: Appears Intact Proprioception: Impaired by gross assessment Additional Comments: absent sensation along R great toe and decreased along top of foot Coordination Gross Motor Movements are Fluid and Coordinated: No Fine Motor Movements are Fluid and Coordinated: Yes Coordination and Movement Description: mild uncoordination due to L BKA, generalized weakness and deconditioning, and decreased balance/postural control. Finger Nose Finger Test: [facility] bilaterally Heel Shin  Test: [facility] on RLE unable to perform on LLE due to BKA Motor  Motor Motor: Abnormal postural alignment and control Motor - Skilled Clinical Observations: mild uncoordination due to  L BKA, generalized weakness and deconditioning, and decreased balance/postural control.  Mobility Bed Mobility Bed Mobility: Rolling Right;Rolling Left;Sit to Supine;Supine to Sit Rolling Right: Independent Rolling Left: Independent Supine to Sit: Independent Sit to Supine: Independent Transfers Transfers: Sit to Stand;Stand to Sit;Stand Pivot Transfers Sit to Stand: Independent with assistive device Stand to Sit: Independent with assistive device Stand Pivot Transfers: Independent with assistive device Stand Pivot Transfer Details (indicate cue type and reason): none Transfer (Assistive device): Rolling walker Locomotion  Gait Ambulation: Yes Gait Assistance: Supervision/Verbal cueing Gait Distance (Feet): 27 Feet Assistive device: Rolling walker Gait Assistance Details: Verbal cues for technique;Verbal cues for gait pattern;Verbal cues for safe use of DME/AE;Verbal cues for precautions/safety Gait Assistance Details: verbal cues to take smaller "hops" with RLE and to remain within RW BOS for safety Gait Gait: Yes Gait Pattern: Impaired Gait Pattern: Step-to pattern;Decreased step length - right;Decreased stride length;Poor foot clearance - right Gait velocity: decreased Stairs / Additional Locomotion Stairs: Yes Stairs Assistance: Minimal Assistance - Patient > 75% Stair Management Technique: With walker Number of Stairs: 1 Height of Stairs: 4 Curb: Minimal Assistance - Patient >75% (RW) Architect: Yes Wheelchair Assistance: Independent with Camera operator: Both upper extremities Wheelchair Parts Management: Independent Distance: 15ft  Trunk/Postural Assessment  Cervical Assessment Cervical Assessment: Within Functional Limits Thoracic  Assessment Thoracic Assessment: Exceptions to WFL (kyphosis) Lumbar Assessment Lumbar Assessment: Exceptions to [facility] (posterior pelvic tilt) Postural Control Postural Control: Deficits on evaluation  Balance Balance Balance Assessed: Yes Static Sitting Balance Static Sitting - Balance Support: Feet supported;No upper extremity supported Static Sitting - Level of Assistance: 7: Independent Dynamic Sitting Balance Dynamic Sitting - Balance Support: Feet supported;No upper extremity supported Dynamic Sitting - Level of Assistance: 7: Independent Static Standing Balance Static Standing - Balance Support: Bilateral upper extremity supported (RW) Static Standing - Level of Assistance: 6: Modified independent (Device/Increase time) Dynamic Standing Balance Dynamic Standing - Balance Support: Bilateral upper extremity supported (RW) Dynamic Standing - Level of Assistance: 6: Modified independent (Device/Increase time) Dynamic Standing - Comments: with transfers Extremity Assessment  RLE Assessment RLE Assessment: Exceptions to [facility] General Strength Comments: grossly generalized to 4/5 LLE Assessment LLE Assessment: Exceptions to [facility] General Strength Comments: grossly generalized to 4+/5 (hip flexion/abduction/adduction, knee flexion/extension)   Alfonse Alpers PT, DPT  07/13/2021, 12:20 PM

## 2021-07-13 NOTE — Progress Notes (Signed)
Occupational Therapy Session Note  Patient Details  Name: Sarah Long MRN: [id number] Date of Birth: November 08, 1971  Today's Date: 07/13/2021 OT Individual Time: 1447-1530 OT Individual Time Calculation (min): 43 min    Short Term Goals: Week 2:  OT Short Term Goal 1 (Week 2): STG = LTGs due to remaining LOS  Skilled Therapeutic Interventions/Progress Updates:    Treatment session with focus on functional mobility, care for residual limb, emotional support, and d/c planning.  Pt received upright in w/c reporting recently vomiting and still feeling nauseous but requesting to go outside during session.  Pt able to don limb guard with supervision.  Therapist applied colored tape to end of velcro to increase success with locating tabs when donning/doffing limb guard.  Pt transported outside for emotional support/change of scenery.  Engaged in discussion regarding d/c planning with Mod I goals and recommended DME.  Pt reports feeling more confident in her abilities, but also concerned about her persistent nausea and vomiting as she "doesn't want to be in bed all the time" upon d/c.  Discussed amputee support group and peer support availability as pt still with questions about leisure and typical time frame for prosthesis.  Pt reports still feeling nauseous requesting to return to room.  Completed stand pivot transfer Mod I and left semi-reclined in bed with all needs in reach.  Therapy Documentation Precautions:  Precautions Precautions: Fall Precaution Comments: L BKA Required Braces or Orthoses: Other Brace Other Brace: residual limb guard Restrictions Weight Bearing Restrictions: Yes LLE Weight Bearing: Non weight bearing Other Position/Activity Restrictions: no orders related to RIGHT foot per chart General:   Vital Signs: Therapy Vitals Temp: 99.4 F (37.4 C) Temp Source: Oral Pulse Rate: 84 Resp: 16 BP: 116/73 Patient Position (if appropriate): Sitting Oxygen Therapy SpO2: 100  % O2 Device: Room Air Pain:  Pt with no c/o pain, however reports nausea during session   Therapy/Group: Individual Therapy  Simonne Come 07/13/2021, 4:27 PM

## 2021-07-13 NOTE — Progress Notes (Signed)
Patient ID: Sarah Long, female   DOB: 1971/08/02, 50 y.o.   MRN: [id number] met with pt to discuss SSD, she reports she was getting SSD but was overpaid and they took it. She asked how she contact them. Gave her the phone number for SSD and local office on [address] She will follow up with this. Discussed Medicaid only covers wheelchair or walker, does she want to get walker on her own. She is not sure does not have the money for a walker. Will see if any at [facility] or [facility]

## 2021-07-13 NOTE — Discharge Summary (Signed)
Physician Discharge Summary  Patient ID: Sarah Long MRN: [id number] DOB/AGE: 05/13/1971 50 y.o.  Admit date: 07/01/2021 Discharge date: 07/15/2021  Discharge Diagnoses:  Principal Problem:   Left below-knee amputee ([place]) Acute on chronic anemia Diabetes mellitus Hyperlipidemia AKI on CKD Obesity Hypertension Diastolic congestive heart failure History of pancreatitis History of alcohol/tobacco abuse Bipolar disorder Osteomyelitis right great toe and second toe  Discharged Condition: Stable  Significant Diagnostic Studies: CT HEAD WO CONTRAST  Result Date: 06/25/2021 CLINICAL DATA:  Mental status change, alcohol/drug use. EXAM: CT HEAD WITHOUT CONTRAST TECHNIQUE: Contiguous axial images were obtained from the base of the skull through the vertex without intravenous contrast. COMPARISON:  CT head 01/03/2021 FINDINGS: Brain: No evidence of large-territorial acute infarction. No parenchymal hemorrhage. No mass lesion. No extra-axial collection. No mass effect or midline shift. No hydrocephalus. Basilar cisterns are patent. Vascular: No hyperdense vessel. Skull: No acute fracture or focal lesion. Sinuses/Orbits: Right maxillary sinus mucosal thickening. Otherwise remaining visualized paranasal sinuses and mastoid air cells are clear. The orbits are unremarkable. Other: None. IMPRESSION: No acute intracranial abnormality. Electronically Signed   By: Iven Finn M.D.   On: 06/25/2021 21:59   CT FOOT LEFT WO CONTRAST  Result Date: 06/26/2021 CLINICAL DATA:  Foot swelling. EXAM: CT OF THE LEFT FOOT WITHOUT CONTRAST TECHNIQUE: Multidetector CT imaging of the left foot was performed according to the standard protocol. Multiplanar CT image reconstructions were also generated. COMPARISON:  None. FINDINGS: Study limited by lack of intravenous contrast material. Bones/Joint/Cartilage Gas is identified in the marrow space of multiple bones of the midfoot, including the inferior navicula, the  cuboid, and the middle and lateral cuneiform bones. Gas is also seen in the base of the second, third, and fourth metatarsals. This is associated with apparent decreased mineralization in the same regions. Imaging features compatible with osteomyelitis. Ligaments Suboptimally assessed by CT. Muscles and Tendons Diffuse muscular and subcutaneous edema evident. Overlying skin thickening noted. Soft tissues There is diffuse soft tissue gas throughout the left foot extending up into the ankle. Extensive muscular and soft tissue edema. No discrete abscess evident although assessment is substantially limited by the lack of intravenous contrast material. IMPRESSION: 1. Extensive soft tissue gas in the foot and visualized portion of the ankle. Necrotizing fasciitis is a distinct concern. 2. Gas is identified in the marrow space of multiple bones of the midfoot consistent with associated osteomyelitis. 3. Extensive muscular and soft tissue edema without a discrete fluid collection/abscess evident although assessment is markedly limited by lack of intravenous contrast material. 4. MRI of the foot with and without contrast recommended for more definitive assessment. These results will be called to the ordering clinician or representative by the Radiologist Assistant, and communication documented in the PACS or [employer]. Electronically Signed   By: Misty Stanley M.D.   On: 06/26/2021 06:52   CT FOOT RIGHT WO CONTRAST  Result Date: 06/26/2021 CLINICAL DATA:  Foot swelling. EXAM: CT OF THE RIGHT FOOT WITHOUT CONTRAST TECHNIQUE: Multidetector CT imaging of the right foot was performed according to the standard protocol. Multiplanar CT image reconstructions were also generated. COMPARISON:  None. FINDINGS: Study limited by lack of intravenous contrast material. Bones/Joint/Cartilage No evidence for an acute fracture or dislocation. No gross bony destruction to suggest overt osteomyelitis. Subtle cortical irregularity  noted in the great toe (23/8), nonspecific. Ligaments Suboptimally assessed by CT. Muscles and Tendons Unremarkable. Soft tissues Subcutaneous edema noted, most pronounced in the midfoot and region of the MTP joints. No  gas identified within the soft tissues. No gross discrete fluid collection evident. IMPRESSION: 1. Subcutaneous edema, most pronounced in the midfoot and region of the MTP joints. No gas identified within the soft tissues. No gross discrete fluid collection. 2. No gross bony destruction to suggest overt osteomyelitis. Subtle cortical irregularity in the great toe, nonspecific. 3. If there is clinical concern for osteomyelitis, MRI of the foot with and without contrast recommended to further evaluate. MRI is also more sensitive in the detection of soft tissue abscesses. Electronically Signed   By: Misty Stanley M.D.   On: 06/26/2021 06:39   MR FOOT RIGHT WO CONTRAST  Result Date: 06/26/2021 CLINICAL DATA:  Foot swelling, diabetic, osteomyelitis suspected, no prior imaging EXAM: MRI OF THE RIGHT FOREFOOT WITHOUT CONTRAST TECHNIQUE: Multiplanar, multisequence MR imaging of the right forefoot was performed. No intravenous contrast was administered. COMPARISON:  None. FINDINGS: Bones/Joint/Cartilage There is increased STIR signal throughout the great toe distal phalanx and partially within the proximal phalanx. There is some low T1 signal but not confluent. Additionally, there is edema signal throughout the middle cuneiform without visible fracture line. Ligaments Intact Lisfranc ligament.  No evidence of plantar plate tear Muscles and Tendons No significant muscle atrophy. Intramuscular edema within the intrinsic foot musculature, commonly seen in diabetics. Soft tissues Mild dorsal soft tissue swelling. There is swelling and possible ulcer along the medial aspect of the great toe. IMPRESSION: Edema signal with mild low T1 signal within the great toe distal phalanx and partially within the proximal  phalanx, with possible adjacent soft tissue ulcer. Signal characteristics are compatible with either early osteomyelitis or reactive marrow signal change. Bony edema throughout the middle cuneiform without distinct fracture or confluent low T1 signal to suggest infection. This is favored to represent reactive marrow signal change. Electronically Signed   By: Maurine Simmering   On: 06/26/2021 15:58   PERIPHERAL VASCULAR CATHETERIZATION  Result Date: 06/17/2021 1.  No significant aortoiliac disease. 2.  Left lower extremity: No significant obstructive disease with three-vessel runoff below the knee with intact pedal arch. 3.  Right lower extremity: No significant obstructive disease with three-vessel runoff below the knee. Recommendations: The patient has mild nonobstructive disease.  Suspect that nonhealing ulcerations are due to uncontrolled diabetes.  She seems to have optimal blood flow.  DG Chest Port 1 View  Result Date: 06/27/2021 CLINICAL DATA:  Central line placement EXAM: PORTABLE CHEST 1 VIEW COMPARISON:  06/25/2021 FINDINGS: Right IJ central venous catheter tip is at the cavoatrial junction. No focal airspace consolidation or pulmonary edema. No pleural effusion or pneumothorax. IMPRESSION: Right IJ central venous catheter tip at the cavoatrial junction. Electronically Signed   By: Ulyses Jarred M.D.   On: 06/27/2021 03:24   DG Chest Port 1 View  Result Date: 06/25/2021 CLINICAL DATA:  Sepsis, hyperglycemia EXAM: PORTABLE CHEST 1 VIEW COMPARISON:  03/29/2021 FINDINGS: Single frontal view of the chest demonstrates stable dual lead pacer. Cardiac silhouette is enlarged but stable. No airspace disease, effusion, or pneumothorax. No acute bony abnormalities. IMPRESSION: 1. No acute intrathoracic process. Electronically Signed   By: Randa Ngo M.D.   On: 06/25/2021 18:20   DG Foot Complete Left  Result Date: 06/25/2021 CLINICAL DATA:  Hyperglycemia, question sepsis. Gangrenous left foot. EXAM:  LEFT FOOT - COMPLETE 3+ VIEW COMPARISON:  None. FINDINGS: No cortical erosion or destruction. There is no evidence of fracture or dislocation. There is no evidence of arthropathy or other focal bone abnormality. Diffuse marked subcutaneus soft tissue  edema and emphysema. Vascular calcifications. IMPRESSION: 1. Diffuse marked subcutaneus soft tissue edema and emphysema. Correlate clinically for necrotizing fasciitis. 2. No radiographic findings to suggest osteomyelitis. 3.  No acute displaced fracture or dislocation. Electronically Signed   By: Iven Finn M.D.   On: 06/25/2021 18:20    Labs:  Basic Metabolic Panel: Recent Labs  Lab 07/13/21 0640  NA 136  K 4.6  CL 103  CO2 25  GLUCOSE 138*  BUN 15  CREATININE 1.05*  CALCIUM 8.7*    CBC: No results for input(s): WBC, NEUTROABS, HGB, HCT, MCV, PLT in the last 168 hours.  CBG: Recent Labs  Lab 07/13/21 1132 07/13/21 1629 07/13/21 2113 07/14/21 0532 07/14/21 1205  GLUCAP 173* 152* 215* 174* 194*   Family history.  Mother with hypertension diabetes mellitus and CAD.  Father with hypertension and diabetes mellitus.  Denies any colon cancer esophageal cancer or rectal cancer  Brief HPI:   Sarah Long is a 50 y.o. right-handed female with history of uncontrolled diabetes mellitus bipolar disorder CKD stage III with creatinine 1.93 06/18/2021 peripheral vascular disease diastolic congestive heart failure pancreatitis obesity tobacco as well as alcohol use.  Per chart review had been living in a hotel with her 5 and 59 year old child.  She does have a 58 year old daughter in the area who is taking care of the patient's 59 year old son.  Presented 06/25/2021 with gangrenous abscess and ascending necrotizing fasciitis of the left foot.  Wound was not felt to be salvageable and underwent left BKA 06/27/2021 per Dr. Sharol Given.  Wound VAC as directed.  Placed on Lovenox for DVT prophylaxis.  Acute on chronic anemia 8.2 and monitored.  Leukocytosis  monitored.  Monitoring of renal function AKI/CKD stage III admission chemistries creatinine 3.12 improved to 1.07.  Therapy evaluations completed due to patient decreased functional mobility was admitted for a comprehensive rehab program.   Hospital Course: CASSIOPIA DAIKER was admitted to rehab 07/01/2021 for inpatient therapies to consist of PT, ST and OT at least three hours five days a week. Past admission physiatrist, therapy team and rehab RN have worked together to provide customized collaborative inpatient rehab.  Pertain to patient's left BKA remained stable dressing changes as indicated patient would follow-up Dr. Sharol Given.  Lovenox for DVT prophylaxis.  He did continue on doxycycline for wound coverage until follow-up with Dr. Sharol Given.  Pain managed with use of hydrocodone.  Robaxin for muscle spasms.  Blood pressure controlled on chlorthalidone and would need outpatient follow-up.  Blood sugars monitored continued on Semglee 45 units daily full diabetic teaching.  History of bipolar disorder Latuda as prior to admission.  Crestor ongoing for hyperlipidemia.  Acute on chronic anemia latest hemoglobin 8.1.  Patient did have history of pancreatitis continued on Creon as prior to admission.  History of tobacco alcohol use receiving counsel regards to cessation of illicit drug products and alcohol.  History of diastolic congestive heart failure exhibited no signs of fluid overload.   Blood pressures were monitored on TID basis and soft and monitored  Diabetes has been monitored with ac/hs CBG checks and SSI was use prn for tighter BS control.    Rehab course: During patient's stay in rehab weekly team conferences were held to monitor patient's progress, set goals and discuss barriers to discharge. At admission, patient required minimal guard side-lying to sitting supervision supine to sit minimal guard sit to supine minimal assist stand pivot transfers minimal assist 5 feet rolling walker supervision upper  body bathing  supervision upper body dressing moderate assist lower body dressing  Physical exam.  Blood pressure 120/52 pulse 62 temperature 98.5 respirations 14 oxygen saturations 98% room air Constitutional.  No acute distress HEENT Head.  Normocephalic and atraumatic Eyes.  Pupils round and reactive to light no discharge without nystagmus Neck.  Supple nontender no JVD without thyromegaly Cardiac regular rate rhythm mildly extra sounds or murmur heard Abdomen.  Soft nontender positive bowel sounds without rebound Respiratory effort normal no respiratory distress without wheeze Skin.  Left BKA dressing in place appropriately tender Neurologic.  Alert oriented x3.  Cranial nerves II through XII intact.  Motor strength 5/5 in bilateral deltoid bicep tricep grip hip flexor knee extensors ankle dorsi plantarflexion.    He/She  has had improvement in activity tolerance, balance, postural control as well as ability to compensate for deficits. He/She has had improvement in functional use RUE/LUE  and RLE/LLE as well as improvement in awareness.  Supervision upper body bathing minimal assist lower body bathing set up upper body dressing moderate assist lower body dressing minimal assist toilet transfers.  Patient performed bed mobility with supervision.  Transferred wheelchair to bed bed to wheelchair with supervision squat pivot transfers.  Full family teaching completed plan discharge to home       Disposition: Discharged to home    Diet: Diabetic diet  Special Instructions: No driving smoking or alcohol  Continue doxycycline until follow-up with orthopedic services Dr. Sharol Given  Follow Dr. Sharol Given for removal of staples    Medications at discharge 1.  Tylenol as needed 2.  Vitamin C 1000 mg p.o. daily 3.  Hygroton 25 mg p.o. daily 4.  Colace 100 mg p.o. daily 5.  Doxycycline 100 mg every 12 hours 6.  Pepcid 20 mg p.o. daily 7.  Hydrocodone 1 tablet every 8 hours as needed severe  pain 8.  Semglee 45 units daily 9.  Creon 24,000 units p.o. 3 times daily daily before meals 10.  Latuda 40 mg daily with supper 11.  Robaxin 250 mg p.o. twice daily as needed muscle spasms 12.  Multivitamin daily 13.  Protonix 40 mg p.o. daily 14.  Crestor 10 mg p.o. daily  30-35 minutes were spent completing discharge summary and discharge planning  Discharge Instructions     Ambulatory referral to Physical Medicine Rehab   Complete by: As directed    Moderate complexity follow-up 1 to 2 weeks left BKA        Follow-up Information     Raulkar, Clide Deutscher, MD Follow up.   Specialty: Physical Medicine and Rehabilitation Why: Office to call for appointment Contact information: [id number] [place] [address] [phone]         Newt Minion, MD Follow up.   Specialty: Orthopedic Surgery Why: Call for appointment Contact information: [address] [place] [address] [phone]                 Signed: Lavon Paganini Strang 07/14/2021, 12:10 PM

## 2021-07-13 NOTE — Progress Notes (Signed)
Occupational Therapy Session Note  Patient Details  Name: DEARI SESSLER MRN: [id number] Date of Birth: 28-Jun-1971  Today's Date: 07/13/2021 OT Individual Time: 1300-1400 OT Individual Time Calculation (min): 60 min    Short Term Goals: Week 1:  OT Short Term Goal 1 (Week 1): Pt will perform LB bathing with use of LHS with CGA OT Short Term Goal 1 - Progress (Week 1): Met OT Short Term Goal 2 (Week 1): Pt will perform LB dressing with AE PRN with CGA OT Short Term Goal 2 - Progress (Week 1): Met OT Short Term Goal 3 (Week 1): Pt will complete ADL/activity of choice for 10 mins without rest break OT Short Term Goal 3 - Progress (Week 1): Met OT Short Term Goal 4 (Week 1): Pt will perform BSC/toilet transfer with CGA/close supervision and LRAD OT Short Term Goal 4 - Progress (Week 1): Met Week 2:  OT Short Term Goal 1 (Week 2): STG = LTGs due to remaining LOS  Skilled Therapeutic Interventions/Progress Updates:    Patient in bed, alert and cooperative.  She requests a shower this session and denies pain.  Able to sit edge of bed mod I.  Sit pivot transfer bed to w/c with CS.  Able to propel w/c in room with CS, min A over threshold into bathroom.  Sit pivot transfer w/c to shower bench with CS.  Able to doff clothing set up/CS.   Assisted with covering residual limb.  She completed shower seated on bench/standing with grab bars, hand held shower CS level.  Dressing completed w/c level with set up/CS.  Grooming tasks mod I.  Ambulation with RW in room, around obstacles and practice with side stepping with CS.  She is able to donn limb guard with set up.  She remained seated in w/c at close of session, call bell and tray table in reach.    Therapy Documentation Precautions:  Precautions Precautions: Fall Precaution Comments: L BKA Required Braces or Orthoses: Other Brace Other Brace: residual limb guard Restrictions Weight Bearing Restrictions: Yes LLE Weight Bearing: Non weight  bearing Other Position/Activity Restrictions: no orders related to RIGHT foot per chart Other Treatments:     Therapy/Group: Individual Therapy  Carlos Levering 07/13/2021, 7:38 AM

## 2021-07-13 NOTE — Telephone Encounter (Signed)
Called pt to resched 07/30/21 appt earlier per PCP's request. Pt answered she's currently in the hospital for an amputation and not sure when she will be available and will call [place] back to move up 8/25 appt. Please advise and thank you

## 2021-07-13 NOTE — Plan of Care (Signed)
  Problem: RH Stairs Goal: LTG Patient will ambulate up and down stairs w/assist (PT) Description: LTG: Patient will ambulate up and down # of stairs with assistance (PT) Outcome: Not Applicable Flowsheets (Taken 07/13/2021 0749) LTG: Pt will ambulate up/down stairs assist needed:: (D/C) -- Note: D/C

## 2021-07-13 NOTE — Progress Notes (Signed)
Pt educated on wound care and insulin use. Pt verbalizes understanding of wound care and insulin use.  Sarah Long

## 2021-07-13 NOTE — Progress Notes (Signed)
Physical Therapy Session Note  Patient Details  Name: Sarah Long MRN: [id number] Date of Birth: Apr 19, 1971  Today's Date: 07/13/2021 PT Individual Time: 0930-1009 PT Individual Time Calculation (min): 39 min   Short Term Goals: Week 1:  PT Short Term Goal 1 (Week 1): Patient will perform squat pivot transfers with S PT Short Term Goal 1 - Progress (Week 1): Progressing toward goal PT Short Term Goal 2 (Week 1): Patient will ambulated 5' with CGA with RW PT Short Term Goal 2 - Progress (Week 1): Progressing toward goal PT Short Term Goal 3 (Week 1): Patient will perform w/c mobility S over level indoor surfaces 150' PT Short Term Goal 3 - Progress (Week 1): Met PT Short Term Goal 4 (Week 1): Patient to perform standing balance activity x 2 minutes prior to needing seated rest. PT Short Term Goal 4 - Progress (Week 1): Met Week 2:  PT Short Term Goal 1 (Week 2): STG=LTG due to LOS  Skilled Therapeutic Interventions/Progress Updates:   Received pt sitting in WC, pt agreeable to PT treatment, and denied any pain during session. Session with emphasis on functional mobility/transfers, generalized strengthening, dynamic standing balance/coordination, D/C planning and navigating home environment, and improved activity tolerance. Donned limb guard with supervision and transported to dayroom WC total A for time management purposes. Pt reports having tight spaces throughout hotel room and potentially having to side step to get into bathroom. Therefore, worked on side stepping 51ft x 4 trials with RW and min A along plinth with cues for technique and RW management. Pt then performed RLE strengthening on Kinetron at 20cm/sec for 1 minute x 3 trials with therapist providing manual counter resistance with emphasis on quad/glute strengthening and core stabilization. During rest breaks, discussed importance of ensuring that her cell phone is charged and on her at all times as she will be home alone; pt in  agreement. Pt with questions regarding disability paperwork; CSW notified. Pt performed WC mobility 185ft using BUE and supervision and transported remainder of way back to room in [facility] total A due to UE fatigue. Concluded session with pt sitting in WC, needs within reach, and chair pad alarm on.   Therapy Documentation Precautions:  Precautions Precautions: Fall Precaution Comments: L BKA Required Braces or Orthoses: Other Brace Other Brace: residual limb guard Restrictions Weight Bearing Restrictions: Yes LLE Weight Bearing: Non weight bearing Other Position/Activity Restrictions: no orders related to RIGHT foot per chart  Therapy/Group: Individual Therapy Alfonse Alpers PT, DPT   07/13/2021, 7:38 AM

## 2021-07-14 ENCOUNTER — Other Ambulatory Visit (HOSPITAL_COMMUNITY): Payer: Self-pay

## 2021-07-14 LAB — GLUCOSE, CAPILLARY
Glucose-Capillary: 174 mg/dL — ABNORMAL HIGH (ref 70–99)
Glucose-Capillary: 194 mg/dL — ABNORMAL HIGH (ref 70–99)
Glucose-Capillary: 198 mg/dL — ABNORMAL HIGH (ref 70–99)
Glucose-Capillary: 190 mg/dL — ABNORMAL HIGH (ref 70–99)

## 2021-07-14 MED ORDER — HYDROCODONE-ACETAMINOPHEN 5-325 MG PO TABS: 1.0000 | ORAL_TABLET | Freq: Every day | ORAL | Status: DC | PRN

## 2021-07-14 MED ORDER — FAMOTIDINE 20 MG PO TABS
20.0000 mg | ORAL_TABLET | Freq: Every day | ORAL | 0 refills | Status: DC
  Filled 2021-07-14: qty 30, 30d supply, fill #0

## 2021-07-14 MED ORDER — ASCORBIC ACID 1000 MG PO TABS
1000.0000 mg | ORAL_TABLET | Freq: Every day | ORAL | 0 refills | Status: DC
  Filled 2021-07-14: qty 30, 30d supply, fill #0

## 2021-07-14 MED ORDER — ZINC SULFATE 220 (50 ZN) MG PO TABS
220.0000 mg | ORAL_TABLET | Freq: Every day | ORAL | 0 refills | Status: DC
  Filled 2021-07-14: qty 30, 30d supply, fill #0

## 2021-07-14 MED ORDER — CHLORTHALIDONE 25 MG PO TABS
25.0000 mg | ORAL_TABLET | Freq: Every day | ORAL | 1 refills | Status: DC
  Filled 2021-07-14: qty 30, 30d supply, fill #0

## 2021-07-14 MED ORDER — MELATONIN 3 MG PO TABS
3.0000 mg | ORAL_TABLET | Freq: Every evening | ORAL | 0 refills | Status: DC | PRN
  Filled 2021-07-14: qty 30, 30d supply, fill #0

## 2021-07-14 MED ORDER — ROSUVASTATIN CALCIUM 10 MG PO TABS
10.0000 mg | ORAL_TABLET | Freq: Every day | ORAL | 1 refills | Status: DC
  Filled 2021-07-14: qty 90, 90d supply, fill #0

## 2021-07-14 MED ORDER — METHOCARBAMOL 500 MG PO TABS
250.0000 mg | ORAL_TABLET | Freq: Two times a day (BID) | ORAL | 0 refills | Status: DC | PRN
  Filled 2021-07-14: qty 30, 30d supply, fill #0

## 2021-07-14 MED ORDER — LURASIDONE HCL 40 MG PO TABS
ORAL_TABLET | ORAL | 1 refills | Status: DC
  Filled 2021-07-14: qty 30, 30d supply, fill #0

## 2021-07-14 MED ORDER — HYDROCODONE-ACETAMINOPHEN 5-325 MG PO TABS
1.0000 | ORAL_TABLET | Freq: Two times a day (BID) | ORAL | 0 refills | Status: DC | PRN
  Filled 2021-07-14: qty 30, 15d supply, fill #0

## 2021-07-14 MED ORDER — DOXYCYCLINE HYCLATE 100 MG PO TABS
100.0000 mg | ORAL_TABLET | Freq: Two times a day (BID) | ORAL | 0 refills | Status: DC
  Filled 2021-07-14: qty 60, 30d supply, fill #0

## 2021-07-14 MED ORDER — PANTOPRAZOLE SODIUM 40 MG PO TBEC
40.0000 mg | DELAYED_RELEASE_TABLET | Freq: Every day | ORAL | 0 refills | Status: DC
  Filled 2021-07-14: qty 30, 30d supply, fill #0

## 2021-07-14 MED ORDER — ZENPEP 20000-63000 UNITS PO CPEP
1.0000 | ORAL_CAPSULE | Freq: Three times a day (TID) | ORAL | 4 refills | Status: DC
  Filled 2021-07-14: qty 180, 90d supply, fill #0

## 2021-07-14 MED ORDER — LANTUS SOLOSTAR 100 UNIT/ML ~~LOC~~ SOPN
45.0000 [IU] | PEN_INJECTOR | Freq: Every day | SUBCUTANEOUS | 11 refills | Status: DC
  Filled 2021-07-14: qty 15, 33d supply, fill #0

## 2021-07-14 NOTE — Progress Notes (Signed)
Occupational Therapy Session Note  Patient Details  Name: Sarah Long MRN: [id number] Date of Birth: 1971-06-08  Today's Date: 07/14/2021 OT Individual Time: [id number] OT Individual Time Calculation (min): 63 min    Short Term Goals: Week 2:  OT Short Term Goal 1 (Week 2): STG = LTGs due to remaining LOS  Skilled Therapeutic Interventions/Progress Updates:    Treatment session with focus on self-care retraining and functional transfers in preparation for d/c.  Pt received upright in w/c expressing desire to shower.  Pt completed w/c mobility in room Mod I.  Pt completed stand pivot transfer w/c > tub bench Mod I.  Pt able to cover residual limb in preparation for shower, completed bathing from tub bench Mod I.Pt engaged in dressing at sinkside with ability to don shrinker independently.  Pt completed LB dressing at sit > stand level Mod I at sink while alternating UE support on sink.  Therapist reiterated education on routine skin inspection of residual limb as well as intact foot.  Discussed setup of home bathroom and routine with placement of items to increase safety in home.  Therapist issued pt a limb inspection mirror for inspection and provided pt with handouts for amputee support group and care for intact limb.  Pt remained upright in w/c with all needs in reach.  Therapy Documentation Precautions:  Precautions Precautions: Fall Precaution Comments: L BKA Required Braces or Orthoses: Other Brace Other Brace: residual limb guard Restrictions Weight Bearing Restrictions: Yes LLE Weight Bearing: Non weight bearing Other Position/Activity Restrictions: no orders related to RIGHT foot per chart  Pain: Pain Assessment Pain Scale: 0-10 Pain Score: 0-No pain    Therapy/Group: Individual Therapy  Simonne Come 07/14/2021, 10:11 AM

## 2021-07-14 NOTE — Patient Care Conference (Cosign Needed)
Inpatient RehabilitationTeam Conference and Plan of Care Update Date: 07/14/2021   Time: 13:36 PM    Patient Name: Sarah Long      Medical Record Number: [id number]  Date of Birth: 11-Jun-1971 Sex: Female         Room/Bed: 4M06C/4M06C-01 Payor Info: Payor: MEDICAID [place] / Plan: MEDICAID [place] ACCESS / Product Type: *No Product type* /    Admit Date/Time:  07/01/2021  1:47 PM  Primary Diagnosis:  Left below-knee amputee [facility])  Hospital Problems: Principal Problem:   Left below-knee amputee [facility])    Expected Discharge Date: Expected Discharge Date: 07/15/21  Team Members Present: Physician leading conference: Dr. Leeroy Cha Social Worker Present: Ovidio Kin, LCSW Nurse Present: Dorien Chihuahua, RN PT Present: Becky Sax, PT OT Present: Simonne Come, OT PPS Coordinator present : Gunnar Fusi, SLP     Current Status/Progress Goal Weekly Team Focus  Bowel/Bladder   Patient is continent of bowel and bladder  Patient will remain continent of bowel and bladder  Will assess qshift and PRN   Swallow/Nutrition/ Hydration             ADL's   Supervision bathing and dressing with lateral leans and sit > stand, Supervision ambulatory transfers with RW, Mod I stand pivot bed > w/c, pt able to don shrinker and limb guard with supervision  Mod I  ADL retraining, transfers, dynamic standing balance, care for residual limb, d/c planning   Mobility   bed mobility supervision, transfers with RW supervision, gait 86f with RW CGA, 1 curb with RW CGA  Mod I/supervision  functional mobility/transfers, generalized strengthening, dynamic standing balance/coordination, gait training, amputee education, endurance, D/C planning   Communication             Safety/Cognition/ Behavioral Observations            Pain   Patient's pain is a 0 on a scale of 0-10  Patient's pain will remain a zero  Will assess qshift and PRN   Skin   Patient's incision is healing  Patient's incision will continue  to heal  Will assess qshift and PRN     Discharge Planning:  Home back to extended hotel and daughter will be checking daily on-Father of minor children to keep them longer so pt can transition home   Team Discussion: Nausea and vomiting with pain medications addressed per MD.  Continue doxycycline for right great toe osteo.  Patient on target to meet rehab goals: yes, currently requires mod I assist for transfers. Able to ambulate short distances with supervision.   *See Care Plan and progress notes for long and short-term goals.   Revisions to Treatment Plan:  Working on contra-lateral limb, residual limb and skin care   Teaching Needs: Medications, skin care, safety, etc.  Current Barriers to Discharge: Home enviroment access/layout, Wound care, and Lack of/limited family support and [facility]  Possible Resolutions to Barriers: Set up with DME ( w/c, walker and BSC) Recommend save OP visits for prosthesis training     Medical Summary Current Status: uncontrolled type 2 DM, nausea and vomiting after Oxycodone/Hydrocodone usem R toe osteomyelitis, vaginal yeast infection, residual limb inscision  Barriers to Discharge: Medical stability  Barriers to Discharge Comments: uncontrolled type 2 DM, nausea and vomiting after Oxycodone/Hydrocodone usem R toe osteomyelitis, vaginal yeast infection, residual limb inscision Possible Resolutions to [employer] have increased insulin to its maximum dose, staples may be removed today, continue doxyclycline, switch  Norco to Tramadol   Continued Need for Acute Rehabilitation Level of Care: The patient requires daily medical management by a physician with specialized training in physical medicine and rehabilitation for the following reasons: Direction of a multidisciplinary physical rehabilitation program to maximize functional independence : Yes Medical management of patient stability for increased activity  during participation in an intensive rehabilitation regime.: Yes Analysis of laboratory values and/or radiology reports with any subsequent need for medication adjustment and/or medical intervention. : Yes   I attest that I was present, lead the team conference, and concur with the assessment and plan of the team.   Dorien Chihuahua B 07/14/2021, 2:02 PM

## 2021-07-14 NOTE — Progress Notes (Signed)
Inpatient Rehabilitation Care Coordinator Discharge Note  The overall goal for the admission was met for:   Discharge location: Yes-HOME ALONE WITH INTERMITTENT ASSIST FROM DAUGHTER  Length of Stay: Yes-14 DAYS  Discharge activity level: Yes-MOD/I LEVEL  Home/community participation: Yes  Services provided included: MD, RD, PT, OT, RN, CM, Pharmacy, and SW  Financial Services: Medicaid  Choices offered to/list presented to:PT  Follow-up services arranged: DME: WHEELCHAIR HAS TUB SEAT AND 3 IN 1 FROM PAST ADMISSIONS. ROLLING WALKER FROM UNIT. WILL GO TO OPPT ONCE ABLE TO GET PROTHESIS . [facility] GIVEN TO PT TO PURSUE [phone]  Comments (or additional information):PT DID WELL AND REACHED MOD/I LEVEL.   Patient/Family verbalized understanding of follow-up arrangements: Yes  Individual responsible for coordination of the follow-up plan: QUASHIA-DAUGHTER [phone]   Confirmed correct DME delivered: Elease Hashimoto 07/14/2021    Elease Hashimoto

## 2021-07-14 NOTE — Progress Notes (Signed)
Occupational Therapy Discharge Summary  Patient Details  Name: Sarah Long MRN: [id number] Date of Birth: 1971/02/28   Patient has met 10 of 10 long term goals due to improved activity tolerance, improved balance, postural control, ability to compensate for deficits, and improved awareness.  Patient to discharge at overall Modified Independent level.  Patient's care partner unavailable to provide the necessary physical assistance at discharge.  Patient to d/c home alone with intermittent assist from family and significant other.  Reasons goals not met: N/A  Recommendation:  Patient will benefit from ongoing skilled OT services in home health setting to continue to advance functional skills in the area of BADL and Reduce care partner burden.  Equipment: Tub bench and 3 in 1  Reasons for discharge: treatment goals met and discharge from hospital  Patient/family agrees with progress made and goals achieved: Yes  OT Discharge Precautions/Restrictions  Precautions Precautions: Fall Precaution Comments: L BKA Required Braces or Orthoses: Other Brace Other Brace: residual limb guard Restrictions Weight Bearing Restrictions: Yes LLE Weight Bearing: Non weight bearing General   Vital Signs Therapy Vitals Temp: 98.5 F (36.9 C) Temp Source: Oral Pulse Rate: (!) 102 Resp: 18 BP: 113/85 Patient Position (if appropriate): Lying Oxygen Therapy SpO2: 100 % O2 Device: Room Air Pain Pain Assessment Pain Scale: 0-10 Pain Score: 0-No pain ADL ADL Eating: Independent Grooming: Modified independent Upper Body Bathing: Modified independent Where Assessed-Upper Body Bathing: Shower Lower Body Bathing: Modified independent Where Assessed-Lower Body Bathing: Shower Upper Body Dressing: Modified independent (Device) Where Assessed-Upper Body Dressing: Wheelchair Lower Body Dressing: Modified independent Where Assessed-Lower Body Dressing: Sitting at sink, Wheelchair Toileting:  Modified independent Toilet Transfer: Modified independent Armed forces technical officer Method: Arts development officer: Geophysical data processor: Modified independent Social research officer, government Method: Radiographer, therapeutic: Civil engineer, contracting with back, Grab bars Vision Baseline Vision/History: Wears glasses Wears Glasses: Reading only Patient Visual Report: No change from baseline Vision Assessment?: Yes;No apparent visual deficits Perception  Perception: Within Functional Limits Praxis Praxis: Intact Cognition Overall Cognitive Status: Within Functional Limits for tasks assessed Arousal/Alertness: Awake/alert Orientation Level: Oriented X4 Attention: Selective;Sustained Sustained Attention: Appears intact Selective Attention: Appears intact Memory: Appears intact Awareness: Appears intact Problem Solving: Appears intact Safety/Judgment: Appears intact Sensation Sensation Light Touch: Appears Intact Proprioception: Impaired by gross assessment Additional Comments: absent sensation along R great toe and decreased along top of foot Coordination Gross Motor Movements are Fluid and Coordinated: No Fine Motor Movements are Fluid and Coordinated: Yes Coordination and Movement Description: mild uncoordination due to L BKA, generalized weakness and deconditioning, and decreased balance/postural control. Finger Nose Finger Test: [facility] bilaterally Heel Shin Test: [facility] on RLE unable to perform on LLE due to BKA Motor  Motor Motor: Abnormal postural alignment and control Motor - Skilled Clinical Observations: mild uncoordination due to L BKA, generalized weakness and deconditioning, and decreased balance/postural control. Mobility  Bed Mobility Bed Mobility: Rolling Right;Rolling Left;Sit to Supine;Supine to Sit Rolling Right: Independent Rolling Left: Independent Supine to Sit: Independent Sit to Supine: Independent Transfers Sit to Stand: Independent with  assistive device Stand to Sit: Independent with assistive device  Trunk/Postural Assessment  Cervical Assessment Cervical Assessment: Within Functional Limits Thoracic Assessment Thoracic Assessment: Exceptions to [facility] (kyphosis) Lumbar Assessment Lumbar Assessment: Exceptions to [facility] (posterior pelvic tilt) Postural Control Postural Control: Deficits on evaluation  Balance Balance Balance Assessed: Yes Static Sitting Balance Static Sitting - Balance Support: Feet supported;No upper extremity supported Static Sitting - Level of Assistance:  7: Independent Dynamic Sitting Balance Dynamic Sitting - Balance Support: Feet supported;No upper extremity supported Dynamic Sitting - Level of Assistance: 7: Independent Static Standing Balance Static Standing - Balance Support: Bilateral upper extremity supported (RW) Static Standing - Level of Assistance: 6: Modified independent (Device/Increase time) Dynamic Standing Balance Dynamic Standing - Balance Support: Bilateral upper extremity supported (RW) Dynamic Standing - Level of Assistance: 6: Modified independent (Device/Increase time) Extremity/Trunk Assessment RUE Assessment RUE Assessment: Within Functional Limits General Strength Comments: strength grossly 4-/5 and generalized weakness present but still functional for ADL LUE Assessment LUE Assessment: Within Functional Limits General Strength Comments: strength grossly 4-/5 and generalized weakness present but still functional for ADL   Carlean Crowl, [facility] 07/14/2021, 8:00 AM

## 2021-07-14 NOTE — Progress Notes (Signed)
PROGRESS NOTE   Subjective/Complaints: Discussed with Linna Hoff- D. Duda's team usually prefers staples in 4-5 weeks so they will be removed at outpatient f/u Stable for d/c tomorrow  ROS: +dysuria-improving, denies nausea  Objective:   No results found. No results for input(s): WBC, HGB, HCT, PLT in the last 72 hours.   Recent Labs    07/13/21 0640  NA 136  K 4.6  CL 103  CO2 25  GLUCOSE 138*  BUN 15  CREATININE 1.05*  CALCIUM 8.7*      Intake/Output Summary (Last 24 hours) at 07/14/2021 1440 Last data filed at 07/14/2021 0700 Gross per 24 hour  Intake 480 ml  Output --  Net 480 ml        Physical Exam: Vital Signs Blood pressure 128/85, pulse (!) 107, temperature 98.1 F (36.7 C), temperature source Oral, resp. rate 18, height '5\' 4"'$  (1.626 m), weight 80.7 kg, SpO2 100 %. Gen: no distress, normal appearing HEENT: oral mucosa pink and moist, NCAT Cardio: Tachycardia Chest: normal effort, normal rate of breathing Abd: soft, non-distended Ext: no edema Psych: pleasant, normal affect  Skin: left stump vac dressing removed. Wound well approximated and dry with staples. Skin remains extremely dry and cracked right foot/toes. Neuro: Pt is cognitively appropriate with normal insight, memory, and awareness. Seems fairly lucent to me. Cranial nerves 2-12 are intact. Sensory exam is normal. Reflexes are 2+ in all 4's. Fine motor coordination is intact. No tremors. Motor function is grossly 5/5 in UE. Can lift LLE off bed. RLE 4/5. [name]  Musculoskeletal: Full ROM, No pain with AROM or PROM in the neck, trunk, or extremities. Posture appropriate. Left stump still tender   Assessment/Plan: 1. Functional deficits which require 3+ hours per day of interdisciplinary therapy in a comprehensive inpatient rehab setting. Physiatrist is providing close team supervision and 24 hour management of active medical problems listed  below. Physiatrist and rehab team continue to assess barriers to discharge/monitor patient progress toward functional and medical goals  Care Tool:  Bathing    Body parts bathed by patient: Right arm, Left arm, Chest, Abdomen, Front perineal area, Buttocks, Right upper leg, Left upper leg, Right lower leg, Face   Body parts bathed by helper: Right lower leg Body parts n/a: Left lower leg   Bathing assist Assist Level: Supervision/Verbal cueing     Upper Body Dressing/Undressing Upper body dressing   What is the patient wearing?: Pull over shirt    Upper body assist Assist Level: Set up assist    Lower Body Dressing/Undressing Lower body dressing      What is the patient wearing?: Underwear/pull up, Pants, Ace wrap/stump shrinker     Lower body assist Assist for lower body dressing: Supervision/Verbal cueing     Toileting Toileting    Toileting assist Assist for toileting: Minimal Assistance - Patient > 75%     Transfers Chair/bed transfer  Transfers assist     Chair/bed transfer assist level: Independent with assistive device Chair/bed transfer assistive device: Museum/gallery exhibitions officer assist      Assist level: Supervision/Verbal cueing Assistive device: Walker-rolling Max distance: 73f   Walk 10 feet  activity   Assist     Assist level: Supervision/Verbal cueing Assistive device: Walker-rolling   Walk 50 feet activity   Assist Walk 50 feet with 2 turns activity did not occur: Safety/medical concerns (fatigue, weakness, deconditioning, decreased balance)         Walk 150 feet activity   Assist Walk 150 feet activity did not occur: Safety/medical concerns (fatigue, weakness, deconditioning, decreased balance)         Walk 10 feet on uneven surface  activity   Assist Walk 10 feet on uneven surfaces activity did not occur: Safety/medical concerns (fatigue, weakness, deconditioning, decreased balance)          Wheelchair     Assist Will patient use wheelchair at discharge?: Yes Type of Wheelchair: Manual    Wheelchair assist level: Independent Max wheelchair distance: 163f    Wheelchair 50 feet with 2 turns activity    Assist        Assist Level: Independent   Wheelchair 150 feet activity     Assist  Wheelchair 150 feet activity did not occur: Safety/medical concerns   Assist Level: Independent   Blood pressure 128/85, pulse (!) 107, temperature 98.1 F (36.7 C), temperature source Oral, resp. rate 18, height '5\' 4"'$  (1.626 m), weight 80.7 kg, SpO2 100 %.  Medical Problem List and Plan: 1.  Debility secondary to gangrenous left foot.  Status post left BKA 06/27/2021.                -patient may not shower             -ELOS/Goals: 8-10 days mod I  -Continue CIR therapies including PT, OT   -Interdisciplinary Team Conference today   2.  Impaired mobility, ambulating <50 feet: continue Lovenox for DVT prophylaxis, may d/c Lovenox upon discharge.  3. Residual limb pain: wean methocarbamol to '250mg'$  BID given effects on cognition. Wean Norco to daily prn.  Start tylenol 1000U TID- LFTs normal, repeat Monday. F/u CMP 4. Insomnia: Melatonin nightly as needed.  5. Neuropsych: This patient is capable of making decisions on her own behalf. 6. Residual limb incision: shrinker  -limb guard  -eucerin cream to right foot  -staples to be removed at outpatient f/u with Dr. DSharol Given7. Fluids/Electrolytes/Nutrition: encourage PO  -I personally reviewed the patient's labs today.   8.  Acute on chronic anemia.     -no gross bleeding on exam  -hgb up to  8.1 on 8/1 9.  Diabetes mellitus with peripheral neuropathy. Increase glargine to 45U. Outpatient Qutenza. Provided list of foods that are good for diabetes.  Cannot increase Glargine further- can consider oral medications  CBG (last 3)  Recent Labs    07/13/21 2113 07/14/21 0532 07/14/21 1205  GLUCAP 215* 174* 194*  10.   Hyperlipidemia.  Continue Crestor 11.  AKI on CKD.  Cr down to 1.11. Encourage 6-8 glasses of water per day.   12.  Obesity.  BMI 34.33.  Dietary follow-up 13.  Hypertension.  Continue Hygroton 25 mg daily. 14.  Diastolic congestive heart failure.  Monitor for any signs of fluid overload. Weights stable, continue to monitor.  Filed Weights   07/12/21 0500 07/13/21 0344 07/14/21 0500  Weight: 80.1 kg 80.1 kg 80.7 kg   Weights balanced 8/8 15.  History of pancreatitis.  Continue Creon 16.  History of alcohol tobacco abuse.  Counseling 17. Leukocytosis: wbcs down to 12.5--> 10.0 8/1 18. Bipolar disorder, type 1: continue Latuda.  19. Osteomyelitis right great  toe and second toe: continue doxycycline until outpatient vascular f/u.  20. Dysuria: UC with insignificant growth. UA with budding yeast- discussed with patient and pharmacy starting fluconazole (Monostat not helping much)- but Latuda dose would have to be decreased given accumulative risk of Long QT syndrome- symptoms improving so will hold off for now    LOS: 13 days A FACE TO FACE EVALUATION WAS PERFORMED  Martha Clan P Tucker Steedley 07/14/2021, 2:40 PM

## 2021-07-14 NOTE — Progress Notes (Signed)
Occupational Therapy Session Note  Patient Details  Name: Sarah Long MRN: [id number] Date of Birth: 1971/10/28  Today's Date: 07/14/2021 OT Group Time: 1500-1600 OT Group Time Calculation (min): 60 min   Short Term Goals: Week 1:  OT Short Term Goal 1 (Week 1): Pt will perform LB bathing with use of LHS with CGA OT Short Term Goal 1 - Progress (Week 1): Met OT Short Term Goal 2 (Week 1): Pt will perform LB dressing with AE PRN with CGA OT Short Term Goal 2 - Progress (Week 1): Met OT Short Term Goal 3 (Week 1): Pt will complete ADL/activity of choice for 10 mins without rest break OT Short Term Goal 3 - Progress (Week 1): Met OT Short Term Goal 4 (Week 1): Pt will perform BSC/toilet transfer with CGA/close supervision and LRAD OT Short Term Goal 4 - Progress (Week 1): Met Week 2:  OT Short Term Goal 1 (Week 2): STG = LTGs due to remaining LOS   Skilled Therapeutic Interventions/Progress Updates:    Pt greeted at time of session in ortho gym transported via rehab tech. Pt reports no pain. Patient participated in group session with focus on BUE strengthening and amputee education with use of 2# dowel for the following exercises: bicep curl, chest press, overhead press, FWD circles/rows, and no weighted punches all for 2x15-20 with reps altered according to tolerance and difficulty. Cues throughout session for form and pacing for max benefit and for carryover at home for continuation of strengthening program. Pt was an active member in group conversation and participation. Pt also engaging in conversation and education regarding diabetes management, making healthier food choices, and amputee/future prosthetic with other group members. Transported back to room via rehab tech.   Therapy Documentation Precautions:  Precautions Precautions: Fall Precaution Comments: L BKA Required Braces or Orthoses: Other Brace Other Brace: residual limb guard Restrictions Weight Bearing Restrictions:  Yes LLE Weight Bearing: Non weight bearing Other Position/Activity Restrictions: no orders related to RIGHT foot per chart    Therapy/Group: Group Therapy  Viona Gilmore 07/14/2021, 4:45 PM

## 2021-07-14 NOTE — Progress Notes (Signed)
Patient ID: Sarah Long, female   DOB: 1971-05-29, 50 y.o.   MRN: [id number] Met with pt to discuss team conference progress toward goals and readiness for discharge tomorrow. Pt has a tub seat and 3 in 1 from past admissions, has gotten a wheelchair and rolling walker to take home. Gave pt transportation information for [facility] transport to use for follow up appointments. Will do OP{PT once able to get prothesis. Unable to get home health due to [facility] and no one would accept. See in am for any other questions.

## 2021-07-15 ENCOUNTER — Other Ambulatory Visit (HOSPITAL_COMMUNITY): Payer: Self-pay

## 2021-07-15 LAB — GLUCOSE, CAPILLARY: Glucose-Capillary: 162 mg/dL — ABNORMAL HIGH (ref 70–99)

## 2021-07-15 MED ORDER — ACCU-CHEK GUIDE W/DEVICE KIT
PACK | 0 refills | Status: DC
  Filled 2021-07-15: qty 1, 1d supply, fill #0

## 2021-07-15 MED ORDER — GABAPENTIN 100 MG PO CAPS: 100.0000 mg | ORAL_CAPSULE | Freq: Every day | ORAL | Status: DC

## 2021-07-15 MED ORDER — GABAPENTIN 100 MG PO CAPS
100.0000 mg | ORAL_CAPSULE | Freq: Every day | ORAL | 0 refills | Status: DC
  Filled 2021-07-15: qty 30, 30d supply, fill #0

## 2021-07-15 MED ORDER — HYDROCODONE-ACETAMINOPHEN 5-325 MG PO TABS: 1.0000 | ORAL_TABLET | Freq: Two times a day (BID) | ORAL | 0 refills | Status: DC | PRN

## 2021-07-15 MED ORDER — ACCU-CHEK GUIDE VI STRP
ORAL_STRIP | 12 refills | Status: DC
  Filled 2021-07-15: qty 100, 25d supply, fill #0

## 2021-07-15 MED ORDER — FUROSEMIDE 20 MG PO TABS
10.0000 mg | ORAL_TABLET | Freq: Once | ORAL | Status: AC
  Administered 2021-07-15: 10 mg via ORAL
  Filled 2021-07-15: qty 1

## 2021-07-15 MED ORDER — PENTIPS 32G X 4 MM MISC
3 refills | Status: DC
  Filled 2021-07-15: qty 100, 25d supply, fill #0

## 2021-07-15 MED ORDER — ACCU-CHEK SOFTCLIX LANCETS MISC
5 refills | Status: DC
  Filled 2021-07-15: qty 100, 25d supply, fill #0

## 2021-07-15 MED ORDER — BLOOD GLUCOSE MONITOR KIT
PACK | 0 refills | Status: DC
  Filled 2021-07-15: qty 1, fill #0

## 2021-07-15 MED ORDER — COMFORT EZ PEN NEEDLES 32G X 6 MM MISC
1.0000 "application " | Freq: Every day | 0 refills | Status: DC
  Filled 2021-07-15: qty 100, fill #0

## 2021-07-15 NOTE — Progress Notes (Signed)
PROGRESS NOTE   Subjective/Complaints: Stable for d/c today Patient asks when she will get her period Discussed that staples will be removed at outpatient appointment with Dr. Jess Barters team  ROS: +dysuria-improving, denies nausea, +phantom limb pain  Objective:   No results found. No results for input(s): WBC, HGB, HCT, PLT in the last 72 hours.   Recent Labs    07/13/21 0640  NA 136  K 4.6  CL 103  CO2 25  GLUCOSE 138*  BUN 15  CREATININE 1.05*  CALCIUM 8.7*      Intake/Output Summary (Last 24 hours) at 07/15/2021 0923 Last data filed at 07/15/2021 0750 Gross per 24 hour  Intake 1200 ml  Output --  Net 1200 ml        Physical Exam: Vital Signs Blood pressure 120/74, pulse (!) 102, temperature 98.4 F (36.9 C), temperature source Oral, resp. rate 16, height '5\' 4"'$  (1.626 m), weight 82.1 kg, SpO2 100 %. Gen: no distress, normal appearing HEENT: oral mucosa pink and moist, NCAT Cardio: TTachycardia Chest: normal effort, normal rate of breathing Abd: soft, non-distended Ext: no edema Psych: pleasant, normal affect  Skin: left stump vac dressing removed. Wound well approximated and dry with staples. Skin remains extremely dry and cracked right foot/toes. Neuro: Pt is cognitively appropriate with normal insight, memory, and awareness. Seems fairly lucent to me. Cranial nerves 2-12 are intact. Sensory exam is normal. Reflexes are 2+ in all 4's. Fine motor coordination is intact. No tremors. Motor function is grossly 5/5 in UE. Can lift LLE off bed. RLE 4/5. [name]  Musculoskeletal: Full ROM, No pain with AROM or PROM in the neck, trunk, or extremities. Posture appropriate. Left stump still tender   Assessment/Plan: 1. Functional deficits which require 3+ hours per day of interdisciplinary therapy in a comprehensive inpatient rehab setting. Physiatrist is providing close team supervision and 24 hour management of  active medical problems listed below. Physiatrist and rehab team continue to assess barriers to discharge/monitor patient progress toward functional and medical goals  Care Tool:  Bathing    Body parts bathed by patient: Right arm, Left arm, Chest, Abdomen, Front perineal area, Buttocks, Right upper leg, Left upper leg, Right lower leg, Face   Body parts bathed by helper: Right lower leg Body parts n/a: Left lower leg   Bathing assist Assist Level: Supervision/Verbal cueing     Upper Body Dressing/Undressing Upper body dressing   What is the patient wearing?: Pull over shirt    Upper body assist Assist Level: Set up assist    Lower Body Dressing/Undressing Lower body dressing      What is the patient wearing?: Underwear/pull up, Pants, Ace wrap/stump shrinker     Lower body assist Assist for lower body dressing: Supervision/Verbal cueing     Toileting Toileting    Toileting assist Assist for toileting: Minimal Assistance - Patient > 75%     Transfers Chair/bed transfer  Transfers assist     Chair/bed transfer assist level: Independent with assistive device Chair/bed transfer assistive device: Programmer, multimedia   Ambulation assist      Assist level: Supervision/Verbal cueing Assistive device: Walker-rolling Max distance: 50f  Walk 10 feet activity   Assist     Assist level: Supervision/Verbal cueing Assistive device: Walker-rolling   Walk 50 feet activity   Assist Walk 50 feet with 2 turns activity did not occur: Safety/medical concerns (fatigue, weakness, deconditioning, decreased balance)         Walk 150 feet activity   Assist Walk 150 feet activity did not occur: Safety/medical concerns (fatigue, weakness, deconditioning, decreased balance)         Walk 10 feet on uneven surface  activity   Assist Walk 10 feet on uneven surfaces activity did not occur: Safety/medical concerns (fatigue, weakness, deconditioning,  decreased balance)         Wheelchair     Assist Will patient use wheelchair at discharge?: Yes Type of Wheelchair: Manual    Wheelchair assist level: Independent Max wheelchair distance: 177f    Wheelchair 50 feet with 2 turns activity    Assist        Assist Level: Independent   Wheelchair 150 feet activity     Assist  Wheelchair 150 feet activity did not occur: Safety/medical concerns   Assist Level: Independent   Blood pressure 120/74, pulse (!) 102, temperature 98.4 F (36.9 C), temperature source Oral, resp. rate 16, height '5\' 4"'$  (1.626 m), weight 82.1 kg, SpO2 100 %.  Medical Problem List and Plan: 1.  Debility secondary to gangrenous left foot.  Status post left BKA 06/27/2021.                -patient may not shower             -ELOS/Goals: 8-10 days mod I  -d/c home today   2.  Impaired mobility, ambulating <50 feet: continue Lovenox for DVT prophylaxis, may d/c Lovenox upon discharge.  3. Residual limb pain: wean methocarbamol to '250mg'$  BID given effects on cognition. Wean Norco to daily prn.  Start tylenol 1000U TID- LFTs normal, repeat Monday. F/u CMP 4. Insomnia: Melatonin nightly as needed.  5. Neuropsych: This patient is capable of making decisions on her own behalf. 6. Residual limb incision: shrinker  -limb guard  -eucerin cream to right foot  -staples to be removed at outpatient f/u with Dr. DSharol Given7. Fluids/Electrolytes/Nutrition: encourage PO  -I personally reviewed the patient's labs today.   8.  Acute on chronic anemia.     -no gross bleeding on exam  -hgb up to  8.1 on 8/1 9.  Diabetes mellitus with peripheral neuropathy. Increase glargine to 45U. Outpatient Qutenza. Provided list of foods that are good for diabetes.  Cannot increase Glargine further- can consider oral medications. Discussed with her that her CBGs have been better controlled and we will continue to work on good control outpatient to protect her right leg.   CBG (last  3)  Recent Labs    07/14/21 1633 07/14/21 2113 07/15/21 0620  GLUCAP 198* 190* 162*  10.  Hyperlipidemia.  Continue Crestor 11.  AKI on CKD.  Cr down to 1.11. Encourage 6-8 glasses of water per day.   12.  Obesity.  BMI 34.33.  Dietary follow-up 13.  Hypertension.  Continue [place] 25 mg daily. 14.  Diastolic congestive heart failure.  Monitor for any signs of fluid overload. Weights stable, continue to monitor. Weight up today. Will give '10mg'$  lasix once Filed Weights   07/13/21 0344 07/14/21 0500 07/15/21 0500  Weight: 80.1 kg 80.7 kg 82.1 kg   Weights balanced 8/8 15.  History of pancreatitis.  Continue Creon. Discussed to  continue this until discontinued by her PCP 16.  History of alcohol tobacco abuse.  Counseling 17. Leukocytosis: wbcs down to 12.5--> 10.0 8/1 18. Bipolar disorder, type 1: continue Latuda.  19. Osteomyelitis right great toe and second toe: continue doxycycline until outpatient vascular f/u.  20. Dysuria: UC with insignificant growth. UA with budding yeast- discussed with patient and pharmacy starting fluconazole (Monostat not helping much)- but Latuda dose would have to be decreased given accumulative risk of Long QT syndrome- symptoms improving so will hold off for now    LOS: 14 days A FACE TO FACE EVALUATION WAS PERFORMED  Martha Clan P Ellamarie Naeve 07/15/2021, 9:23 AM

## 2021-07-16 ENCOUNTER — Telehealth: Payer: Self-pay

## 2021-07-16 LAB — CUP PACEART REMOTE DEVICE CHECK
Battery Remaining Longevity: 102 mo
Battery Remaining Percentage: 87 %
Battery Voltage: 2.99 V
Brady Statistic AP VP Percent: 1 %
Brady Statistic AP VS Percent: 1 %
Brady Statistic AS VP Percent: 99 %
Brady Statistic AS VS Percent: 1 %
Brady Statistic RA Percent Paced: 1 %
Brady Statistic RV Percent Paced: 99 %
Date Time Interrogation Session: 20220810161804
Implantable Lead Implant Date: 20210310
Implantable Lead Implant Date: 20210310
Implantable Lead Location: 753859
Implantable Lead Location: 753860
Implantable Pulse Generator Implant Date: 20210310
Lead Channel Impedance Value: 450 Ohm
Lead Channel Impedance Value: 450 Ohm
Lead Channel Pacing Threshold Amplitude: 0.75 V
Lead Channel Pacing Threshold Amplitude: 0.75 V
Lead Channel Pacing Threshold Pulse Width: 0.5 ms
Lead Channel Pacing Threshold Pulse Width: 0.5 ms
Lead Channel Sensing Intrinsic Amplitude: 12 mV
Lead Channel Sensing Intrinsic Amplitude: 5 mV
Lead Channel Setting Pacing Amplitude: 1 V
Lead Channel Setting Pacing Amplitude: 1.75 V
Lead Channel Setting Pacing Pulse Width: 0.5 ms
Lead Channel Setting Sensing Sensitivity: 2 mV
Pulse Gen Model: 2272
Pulse Gen Serial Number: 3802095

## 2021-07-16 NOTE — Telephone Encounter (Signed)
Transition Care Management Follow-up Telephone Call Date of discharge and from where: 07/15/2021, [facility]  How have you been since you were released from the hospital? She said she could use some help. She has her young children with her but needs assistance with bathing. She was agreeable to placing a PCS referral. She said she hs fallen once since returning home.  She fell on her stump and called EMS to evaluate her. She said that there is no bleeding/drainage from the surgical site.  She lives in a motel.  24 hour supervision was recommended on discharge but that is not yet in place.  Any questions or concerns? Yes - noted above She has a glucometer.  Blood sugar fasting this morning: 171, this afternoon it was 267.  She is wondering if she should be on sliding scale insulin in addition to lantus. She said she was on the sliding scale in the hospital and at home in the past.  Informed her that Dr Joya Gaskins would be notified of this concern. She is concerned about her income now because she is not able to work and is not sure how she will pay her rent.  She understands that she can contact DSS and [employer] to request assistance.   Items Reviewed: Did the pt receive and understand the discharge instructions provided? Yes   Medications obtained and verified? Yes  - she said she has all medications. The only question that she had was regarding short acting insulin noted above. Other? No  Any new allergies since your discharge? No  Dietary orders reviewed? She was referred to [employer] for food assistance. She said that she has a message from the agency and needs to call them back.  Do you have support at home? Yes   Home Care and Equipment/Supplies: Were home health services ordered? no If so, what is the name of the agency? N/a  Has the agency set up a time to come to the patient's home? not applicable Were any new equipment or medical supplies ordered?  Yes: w/c, walker, shower  chair What is the name of the medical supply agency? [employer] Were you able to get the supplies/equipment? yes Do you have any questions related to the use of the equipment or supplies? No  Functional Questionnaire: (I = Independent and D = Dependent) ADLs: needs assistance with ADLs. Has been using her walker in her room but would need the wheelchair when she leaves her room.  She is requesting a bedside commode because she has difficulty getting in /out of the bathroom.     Follow up appointments reviewed:  PCP Hospital f/u appt confirmed? Yes  Scheduled to see Dr Joya Gaskins on 07/30/2021 @ 1000. [facility] f/u appt confirmed? Yes  Scheduled to see orthopedics on 07/21/2021, PMR - 08/05/2021 Are transportation arrangements needed? Yes  - Informed her that [facility] [employer] can be arranged to her appointments.  Provided her with the phone number and informed her that this CM will call to register for rides. Also informed her that she can contact Medicaid for rides to medical appointments.  Provided her with the phone number for DSS. She said she will be able to get out of her room to meet her ride and will need wheelchair transportation. Informed her that [facility] [employer] will not be able to come into her home to assist her out of the door.  If their condition worsens, is the pt aware to call PCP or go to  the Emergency Dept.? Yes Was the patient provided with contact information for the PCP's office or ED? Yes Was to pt encouraged to call back with questions or concerns? Yes

## 2021-07-17 NOTE — Telephone Encounter (Signed)
Call placed to [facility] , spoke to [place], made referral and provided authorization for rides to patient's upcoming appointments. Informed him that the patient is able to get out of her room but will need wheelchair transport.  He said he will call patient and schedule rides [facility] and with Dr Joya Gaskins.

## 2021-07-20 NOTE — Telephone Encounter (Signed)
PCS referral faxed to [facility]

## 2021-07-21 ENCOUNTER — Ambulatory Visit (INDEPENDENT_AMBULATORY_CARE_PROVIDER_SITE_OTHER): Payer: Medicaid Other | Admitting: Family

## 2021-07-21 ENCOUNTER — Other Ambulatory Visit: Payer: Self-pay

## 2021-07-21 ENCOUNTER — Encounter: Payer: Self-pay | Admitting: Family

## 2021-07-21 DIAGNOSIS — Z89512 Acquired absence of left leg below knee: Secondary | ICD-10-CM

## 2021-07-21 NOTE — Progress Notes (Signed)
Post-Op Visit Note   [name]           Date of Birth: [birth year]           MRN: [id number] Visit Date: 07/21/2021 PCP: [name], MD  Chief Complaint:  Chief Complaint  Patient presents with   Left Knee - Routine Post Op    HPI:  HPI The patient is a 50 year old woman seen status post left below-knee amputation on July 23.  Her wound VAC was removed on August 2.  She discharged home from inpatient rehab on August 9 she has been doing well she has been using her hydrocodone and gabapentin for pain.  She is using a wheelchair for mobility.  She has no concerns.   Patient is a new left transtibial  amputee.  Patient's current comorbidities are not expected to impact the ability to function with the prescribed prosthesis. Patient verbally communicates a strong desire to use a prosthesis. Patient currently requires mobility aids to ambulate without a prosthesis.  Expects not to use mobility aids with a new prosthesis.  Patient is a K3 level ambulator that spends a lot of time walking around on uneven terrain over obstacles, up and down stairs, and ambulates with a variable cadence.  Ortho Exam On examination of the left residual limb this is well-healed staples are in place this is consolidating well there is no erythema no gaping no drainage  Visit Diagnoses: No diagnosis found.  Plan: Staples harvested today without incident.  She will continue with her shrinker.  She may shower may get this wet she will follow-up in 4 weeks.  Given an order for prosthesis set up to [place] [facility]  Follow-Up Instructions: No follow-ups on file.   Imaging: No results found.  Orders:  No orders of the defined types were placed in this encounter.  No orders of the defined types were placed in this encounter.    PMFS History: Patient Active Problem List   Diagnosis Date Noted   Left below-knee amputee ([place]) 07/01/2021   Necrotizing fasciitis of ankle and foot ([place])     Acute urinary retention 06/26/2021   Protein-calorie malnutrition ([place]) 06/26/2021   Hypoalbuminemia due to protein-calorie malnutrition ([place]) 06/26/2021   Normocytic anemia 06/26/2021   Heart failure with mid-range ejection fraction ([place]) 06/26/2021   Sepsis with encephalopathy ([place]) 06/26/2021   Diabetic nephropathy with proteinuria ([place]) 06/26/2021   Dyspnea    Diabetic ketoacidosis ([place]) 06/25/2021   Gangrene of left foot ([place]) [id number]   Metabolic encephalopathy [id number]   Diabetic foot ulcer ([place]) 05/26/2021   Bacterial vaginosis 07/17/2020   Dental caries 04/01/2020   Cracked tooth 04/01/2020   Alcohol use 03/11/2020   Vitamin D deficiency 03/11/2020   AKI (acute kidney injury) ([place]) 03/11/2020   Prolonged QT interval 02/26/2020   Chronic combined systolic and diastolic CHF (congestive heart failure) ([place]) 02/26/2020   Heart block AV complete ([place]) 02/11/2020   Exocrine pancreatic insufficiency    Dysmenorrhea 10/02/2019   DM type 2 with diabetic peripheral neuropathy ([place]) 05/30/2019   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 block) 05/29/2019   Bilateral bunions 05/29/2019   Diabetes type 2, uncontrolled ([place]) 10/21/2016   Smoking 11/26/2013   Hypercholesteremia 02/02/2007   Bipolar 1 disorder ([place]) 02/02/2007   HYPERTENSION, BENIGN SYSTEMIC 02/02/2007   Past Medical History:  Diagnosis Date   Abnormal Pap smear 1998   Abnormal vaginal bleeding 12/19/2011   Acute urinary retention 06/26/2021  Bacterial infection    Bipolar 1 disorder ([place])    Blister of second toe of left foot 11/21/2016   Candida vaginitis 07/2007   Depression    recently added wellbutrin-has not taken yet for bipolar   Diabetes in pregnancy    Diabetes mellitus    nph 20U qam and qpm, regular with meals   Diabetic ketoacidosis without coma associated with type 2 diabetes mellitus ([place])    Fibroid    Galactorrhea of right breast 2008   H/O amenorrhea 07/2007    H/O dizziness 10/14/2011   H/O dysmenorrhea 2010   H/O menorrhagia 10/14/2011   H/O varicella    Headache(784.0)    Heavy vaginal bleeding due to contraceptive injection use 10/12/2011   Depo Provera   Herpes    HSV-2 infection 01/03/2009   Hx: UTI (urinary tract infection) 2009   Hypertension    on aldomet   Increased BMI 2010   Irregular uterine bleeding 04/04/2012   Pt has mirena    Obesity 10/14/2011   Oligomenorrhea 07/2007   Pelvic pain in female    Presence of permanent cardiac pacemaker    Preterm labor    Trichomonas    Yeast infection     Family History  Problem Relation Age of Onset   Hypertension Mother    Diabetes Mother    Heart disease Mother    Hypertension Father    Diabetes Father    Heart disease Father    Stroke Maternal Grandfather    Other Neg Hx     Past Surgical History:  Procedure Laterality Date   ABDOMINAL AORTOGRAM W/LOWER EXTREMITY N/A 06/17/2021   Procedure: ABDOMINAL AORTOGRAM W/LOWER EXTREMITY;  Surgeon: Wellington Hampshire, MD;  Location: [place] [facility];  Service: Cardiovascular;  Laterality: N/A;   AMPUTATION Left 06/27/2021   Procedure: AMPUTATION BELOW KNEE;  Surgeon: Newt Minion, MD;  Location: [place];  Service: Orthopedics;  Laterality: Left;   CESAREAN SECTION  1991   LEFT HEART CATH AND CORONARY ANGIOGRAPHY N/A 02/10/2020   Procedure: LEFT HEART CATH AND CORONARY ANGIOGRAPHY;  Surgeon: Troy Sine, MD;  Location: [place] [facility];  Service: Cardiovascular;  Laterality: N/A;   PACEMAKER IMPLANT N/A 02/13/2020   Procedure: PACEMAKER IMPLANT;  Surgeon: Constance Haw, MD;  Location: [place] [facility];  Service: Cardiovascular;  Laterality: N/A;   TEMPORARY PACEMAKER N/A 02/10/2020   Procedure: TEMPORARY PACEMAKER;  Surgeon: Troy Sine, MD;  Location: [place] [facility];  Service: Cardiovascular;  Laterality: N/A;   Social History   Occupational History   Not on file  Tobacco Use   Smoking status: Former     Packs/day: 0.50    Years: 20.00    Pack years: 10.00    Types: Cigarettes   Smokeless tobacco: Never  Vaping Use   Vaping Use: Never used  Substance and Sexual Activity   Alcohol use: No   Drug use: No   Sexual activity: Yes    Birth control/protection: None

## 2021-07-30 ENCOUNTER — Telehealth: Payer: Self-pay | Admitting: Critical Care Medicine

## 2021-07-30 ENCOUNTER — Other Ambulatory Visit: Payer: Self-pay

## 2021-07-30 ENCOUNTER — Encounter: Payer: Self-pay | Admitting: Critical Care Medicine

## 2021-07-30 ENCOUNTER — Ambulatory Visit: Payer: Medicaid Other | Attending: Critical Care Medicine | Admitting: Critical Care Medicine

## 2021-07-30 VITALS — BP 134/84 | HR 77 | Ht 64.0 in

## 2021-07-30 DIAGNOSIS — E1165 Type 2 diabetes mellitus with hyperglycemia: Secondary | ICD-10-CM | POA: Diagnosis not present

## 2021-07-30 DIAGNOSIS — I10 Essential (primary) hypertension: Secondary | ICD-10-CM

## 2021-07-30 DIAGNOSIS — E11621 Type 2 diabetes mellitus with foot ulcer: Secondary | ICD-10-CM

## 2021-07-30 DIAGNOSIS — N179 Acute kidney failure, unspecified: Secondary | ICD-10-CM

## 2021-07-30 DIAGNOSIS — I5042 Chronic combined systolic (congestive) and diastolic (congestive) heart failure: Secondary | ICD-10-CM

## 2021-07-30 DIAGNOSIS — E1142 Type 2 diabetes mellitus with diabetic polyneuropathy: Secondary | ICD-10-CM | POA: Diagnosis not present

## 2021-07-30 DIAGNOSIS — Z794 Long term (current) use of insulin: Secondary | ICD-10-CM

## 2021-07-30 DIAGNOSIS — K8681 Exocrine pancreatic insufficiency: Secondary | ICD-10-CM

## 2021-07-30 DIAGNOSIS — L97515 Non-pressure chronic ulcer of other part of right foot with muscle involvement without evidence of necrosis: Secondary | ICD-10-CM

## 2021-07-30 DIAGNOSIS — E7800 Pure hypercholesterolemia, unspecified: Secondary | ICD-10-CM

## 2021-07-30 LAB — GLUCOSE, POCT (MANUAL RESULT ENTRY): POC Glucose: 255 mg/dl — AB (ref 70–99)

## 2021-07-30 MED ORDER — LANTUS SOLOSTAR 100 UNIT/ML ~~LOC~~ SOPN
60.0000 [IU] | PEN_INJECTOR | Freq: Every day | SUBCUTANEOUS | 11 refills | Status: DC
  Filled 2021-07-30 – 2021-08-11 (×2): qty 15, 25d supply, fill #0
  Filled 2021-09-23: qty 15, 25d supply, fill #1

## 2021-07-30 MED ORDER — INSULIN LISPRO (1 UNIT DIAL) 100 UNIT/ML (KWIKPEN)
5.0000 [IU] | PEN_INJECTOR | Freq: Three times a day (TID) | SUBCUTANEOUS | 3 refills | Status: DC
  Filled 2021-07-30: qty 6, 40d supply, fill #0

## 2021-07-30 MED ORDER — DOCUSATE SODIUM 100 MG PO CAPS
100.0000 mg | ORAL_CAPSULE | Freq: Every day | ORAL | 0 refills | Status: DC
  Filled 2021-07-30: qty 10, 10d supply, fill #0

## 2021-07-30 MED ORDER — MELATONIN 3 MG PO TABS
3.0000 mg | ORAL_TABLET | Freq: Every evening | ORAL | 0 refills | Status: DC | PRN
  Filled 2021-07-30: qty 30, 30d supply, fill #0

## 2021-07-30 MED ORDER — ZINC SULFATE 220 (50 ZN) MG PO TABS
220.0000 mg | ORAL_TABLET | Freq: Every day | ORAL | 0 refills | Status: DC
  Filled 2021-07-30: qty 30, 30d supply, fill #0

## 2021-07-30 MED ORDER — TECHLITE PEN NEEDLES 32G X 4 MM MISC
3 refills | Status: DC
  Filled 2021-07-30: qty 100, fill #0

## 2021-07-30 MED ORDER — METHOCARBAMOL 500 MG PO TABS
250.0000 mg | ORAL_TABLET | Freq: Two times a day (BID) | ORAL | 0 refills | Status: DC | PRN
  Filled 2021-07-30 – 2021-08-11 (×2): qty 30, 30d supply, fill #0

## 2021-07-30 MED ORDER — GABAPENTIN 100 MG PO CAPS
100.0000 mg | ORAL_CAPSULE | Freq: Every day | ORAL | 0 refills | Status: DC
  Filled 2021-07-30: qty 30, 30d supply, fill #0

## 2021-07-30 MED ORDER — ROSUVASTATIN CALCIUM 10 MG PO TABS
10.0000 mg | ORAL_TABLET | Freq: Every day | ORAL | 1 refills | Status: DC
  Filled 2021-07-30: qty 90, 90d supply, fill #0

## 2021-07-30 MED ORDER — DOXYCYCLINE HYCLATE 100 MG PO TABS
100.0000 mg | ORAL_TABLET | Freq: Two times a day (BID) | ORAL | 0 refills | Status: DC
  Filled 2021-07-30: qty 60, 30d supply, fill #0

## 2021-07-30 MED ORDER — PANTOPRAZOLE SODIUM 40 MG PO TBEC
40.0000 mg | DELAYED_RELEASE_TABLET | Freq: Every day | ORAL | 0 refills | Status: DC
  Filled 2021-07-30 – 2021-08-11 (×2): qty 30, 30d supply, fill #0

## 2021-07-30 MED ORDER — ZENPEP 20000-63000 UNITS PO CPEP
1.0000 | ORAL_CAPSULE | Freq: Three times a day (TID) | ORAL | 4 refills | Status: DC
  Filled 2021-07-30: qty 180, 90d supply, fill #0

## 2021-07-30 MED ORDER — FAMOTIDINE 20 MG PO TABS
20.0000 mg | ORAL_TABLET | Freq: Every day | ORAL | 0 refills | Status: DC
  Filled 2021-07-30: qty 30, 30d supply, fill #0

## 2021-07-30 MED ORDER — ACCU-CHEK GUIDE VI STRP
ORAL_STRIP | 12 refills | Status: DC
  Filled 2021-07-30: qty 100, fill #0

## 2021-07-30 MED ORDER — POLYETHYLENE GLYCOL 3350 17 GM/SCOOP PO POWD
17.0000 g | Freq: Every day | ORAL | 0 refills | Status: DC | PRN
  Filled 2021-07-30: qty 238, 14d supply, fill #0

## 2021-07-30 MED ORDER — HYDROCODONE-ACETAMINOPHEN 5-325 MG PO TABS
1.0000 | ORAL_TABLET | Freq: Two times a day (BID) | ORAL | 0 refills | Status: DC | PRN
  Filled 2021-07-30: qty 30, 15d supply, fill #0

## 2021-07-30 MED ORDER — HYDROCODONE-ACETAMINOPHEN 5-325 MG PO TABS: 1.0000 | ORAL_TABLET | Freq: Two times a day (BID) | ORAL | 0 refills | Status: DC | PRN

## 2021-07-30 MED ORDER — CHLORTHALIDONE 25 MG PO TABS
25.0000 mg | ORAL_TABLET | Freq: Every day | ORAL | 1 refills | Status: DC
  Filled 2021-07-30: qty 90, 90d supply, fill #0

## 2021-07-30 NOTE — Assessment & Plan Note (Signed)
Diabetic foot ulcer right lower extremity status post left BKA referral to wound care

## 2021-07-30 NOTE — Progress Notes (Signed)
Discuss handicap sticker Referral for Pcs service, wound care

## 2021-07-30 NOTE — Assessment & Plan Note (Signed)
Continue cholesterol therapy

## 2021-07-30 NOTE — Progress Notes (Signed)
Remote pacemaker transmission.   

## 2021-07-30 NOTE — Assessment & Plan Note (Signed)
Compensated chronic systolic diastolic heart failure

## 2021-07-30 NOTE — Assessment & Plan Note (Signed)
Severe diabetes type 2 uncontrolled referral to endocrinology the patient has Medicaid Increase Lantus to 60 units daily begin Humalog 5 units 3 times daily with meals

## 2021-07-30 NOTE — Assessment & Plan Note (Signed)
Exocrine pancreatic insufficiency continue Pancrease

## 2021-07-30 NOTE — Patient Instructions (Signed)
Increase Lantus to 60 units daily  Begin insulin lispro 5 units 3 times daily before each meal  Referral to endocrinology was made  Referral to wound care clinic made  Handicap sticker application processed and given  You connected with Opal Sidles our case manager around a Medicaid meal program  Inquire about an orthotic shoe for your right foot when you go to get your prosthesis measurement at the [facility] clinic, we sent an order to them  Refills on all medications sent to our pharmacy  Complete set of labs obtained today  Please obtain a COVID booster vaccine  Return to see Sarah Long our clinical pharmacist in 2 weeks and Dr. Joya Long in 6 weeks

## 2021-07-30 NOTE — Assessment & Plan Note (Signed)
Acute renal failure resolved

## 2021-07-30 NOTE — Telephone Encounter (Signed)
I sent a PCS referral to [employer] on 07/20/2021. Will call and check the status.  Order for diabetic shoe faxed to [facility]. Email sent to Manuela Schwartz [facility]/ [employer] to check on status of referral for assistance with obtaining food.

## 2021-07-30 NOTE — Progress Notes (Signed)
Established Patient Office Visit  Subjective:  Patient ID: Sarah Long, female    DOB: [birth year]  Age: 50 y.o. MRN: [id number]  CC:  Chief Complaint  Patient presents with   Diabetes    HPI Sarah Long presents for transition of care post hospital visit.  Patient was admitted and discharged as noted below.  Patient has severe type 2 diabetes of difficult to control chronic kidney disease severe diabetic neuropathy diabetic foot ulcers and subsequent now is had a left below-knee amputation.  No patient does have normal blood flow to the both lower extremities during this previous hospitalization as demonstrated.  Patient returns today only on long-acting insulin and not on any other medications.  Blood sugars continue to run in the 200 range routinely.  On arrival blood pressure is 134/80.  Patient is requesting a wound center appointment she is already received her flu vaccine.  She is awaiting being a signed up for the meal program for Medicaid.  She is about to have a prosthesis fit on the left leg.  She is requesting a diabetic shoe for the right foot.  She also wants handicap sticker. Admit date: 07/01/2021 Discharge date: 07/15/2021   Discharge Diagnoses:  Principal Problem:   Left below-knee amputee ([place]) Acute on chronic anemia Diabetes mellitus Hyperlipidemia AKI on CKD Obesity Hypertension Diastolic congestive heart failure History of pancreatitis History of alcohol/tobacco abuse Bipolar disorder Osteomyelitis right great toe and second toe   Discharged Condition: Stable     Family history.  Mother with hypertension diabetes mellitus and CAD.  Father with hypertension and diabetes mellitus.  Denies any colon cancer esophageal cancer or rectal cancer   Brief HPI:   Sarah Long is a 50 y.o. right-handed female with history of uncontrolled diabetes mellitus bipolar disorder CKD stage III with creatinine 1.93 06/18/2021 peripheral vascular disease diastolic congestive  heart failure pancreatitis obesity tobacco as well as alcohol use.  Per chart review had been living in a hotel with her 70 and 104 year old child.  She does have a 56 year old daughter in the area who is taking care of the patient's 82 year old son.  Presented 06/25/2021 with gangrenous abscess and ascending necrotizing fasciitis of the left foot.  Wound was not felt to be salvageable and underwent left BKA 06/27/2021 per Dr. Sharol Given.  Wound VAC as directed.  Placed on Lovenox for DVT prophylaxis.  Acute on chronic anemia 8.2 and monitored.  Leukocytosis monitored.  Monitoring of renal function AKI/CKD stage III admission chemistries creatinine 3.12 improved to 1.07.  Therapy evaluations completed due to patient decreased functional mobility was admitted for a comprehensive rehab program.     Hospital Course: Sarah Long was admitted to rehab 07/01/2021 for inpatient therapies to consist of PT, ST and OT at least three hours five days a week. Past admission physiatrist, therapy team and rehab RN have worked together to provide customized collaborative inpatient rehab.  Pertain to patient's left BKA remained stable dressing changes as indicated patient would follow-up Dr. Sharol Given.  Lovenox for DVT prophylaxis.  He did continue on doxycycline for wound coverage until follow-up with Dr. Sharol Given.  Pain managed with use of hydrocodone.  Robaxin for muscle spasms.  Blood pressure controlled on chlorthalidone and would need outpatient follow-up.  Blood sugars monitored continued on Semglee 45 units daily full diabetic teaching.  History of bipolar disorder Latuda as prior to admission.  Crestor ongoing for hyperlipidemia.  Acute on chronic anemia latest hemoglobin 8.1.  Patient did  have history of pancreatitis continued on Creon as prior to admission.  History of tobacco alcohol use receiving counsel regards to cessation of illicit drug products and alcohol.  History of diastolic congestive heart failure exhibited no signs of fluid  overload.     Blood pressures were monitored on TID basis and soft and monitored   Diabetes has been monitored with ac/hs CBG checks and SSI was use prn for tighter BS control.      Rehab course: During patient's stay in rehab weekly team conferences were held to monitor patient's progress, set goals and discuss barriers to discharge. At admission, patient required minimal guard side-lying to sitting supervision supine to sit minimal guard sit to supine minimal assist stand pivot transfers minimal assist 5 feet rolling walker supervision upper body bathing supervision upper body dressing moderate assist lower body dressing  He/She  has had improvement in activity tolerance, balance, postural control as well as ability to compensate for deficits. He/She has had improvement in functional use RUE/LUE  and RLE/LLE as well as improvement in awareness.  Supervision upper body bathing minimal assist lower body bathing set up upper body dressing moderate assist lower body dressing minimal assist toilet transfers.  Patient performed bed mobility with supervision.  Transferred wheelchair to bed bed to wheelchair with supervision squat pivot transfers.  Full family teaching completed plan discharge to home           Past Medical History:  Diagnosis Date   Abnormal Pap smear 1998   Abnormal vaginal bleeding 12/19/2011   Acute urinary retention 06/26/2021   Bacterial infection    Bipolar 1 disorder (HCC)    Blister of second toe of left foot 11/21/2016   Candida vaginitis 07/2007   Depression    recently added wellbutrin-has not taken yet for bipolar   Diabetes in pregnancy    Diabetes mellitus    nph 20U qam and qpm, regular with meals   Diabetic ketoacidosis without coma associated with type 2 diabetes mellitus ([place])    Fibroid    Galactorrhea of right breast 2008   H/O amenorrhea 07/2007   H/O dizziness 10/14/2011   H/O dysmenorrhea 2010   H/O menorrhagia 10/14/2011   H/O varicella     Headache(784.0)    Heavy vaginal bleeding due to contraceptive injection use 10/12/2011   Depo Provera   Herpes    HSV-2 infection 01/03/2009   Hx: UTI (urinary tract infection) 2009   Hypertension    on aldomet   Increased BMI 2010   Irregular uterine bleeding 04/04/2012   Pt has mirena    Obesity 10/14/2011   Oligomenorrhea 07/2007   Pelvic pain in female    Presence of permanent cardiac pacemaker    Preterm labor    Trichomonas    Yeast infection     Past Surgical History:  Procedure Laterality Date   ABDOMINAL AORTOGRAM W/LOWER EXTREMITY N/A 06/17/2021   Procedure: ABDOMINAL AORTOGRAM W/LOWER EXTREMITY;  Surgeon: Wellington Hampshire, MD;  Location: [facility];  Service: Cardiovascular;  Laterality: N/A;   AMPUTATION Left 06/27/2021   Procedure: AMPUTATION BELOW KNEE;  Surgeon: Newt Minion, MD;  Location: [facility];  Service: Orthopedics;  Laterality: Left;   CESAREAN SECTION  1991   LEFT HEART CATH AND CORONARY ANGIOGRAPHY N/A 02/10/2020   Procedure: LEFT HEART CATH AND CORONARY ANGIOGRAPHY;  Surgeon: Troy Sine, MD;  Location: [facility];  Service: Cardiovascular;  Laterality: N/A;   PACEMAKER IMPLANT N/A 02/13/2020  Procedure: PACEMAKER IMPLANT;  Surgeon: Constance Haw, MD;  Location: [facility];  Service: Cardiovascular;  Laterality: N/A;   TEMPORARY PACEMAKER N/A 02/10/2020   Procedure: TEMPORARY PACEMAKER;  Surgeon: Troy Sine, MD;  Location: [facility];  Service: Cardiovascular;  Laterality: N/A;    Family History  Problem Relation Age of Onset   Hypertension Mother    Diabetes Mother    Heart disease Mother    Hypertension Father    Diabetes Father    Heart disease Father    Stroke Maternal Grandfather    Other Neg Hx     Social History   Socioeconomic History   Marital status: Married    Spouse name: Not on file   Number of children: Not on file   Years of education: Not on file   Highest education level: Not on  file  Occupational History   Not on file  Tobacco Use   Smoking status: Former    Packs/day: 0.50    Years: 20.00    Pack years: 10.00    Types: Cigarettes   Smokeless tobacco: Never  Vaping Use   Vaping Use: Never used  Substance and Sexual Activity   Alcohol use: No   Drug use: No   Sexual activity: Yes    Birth control/protection: None  Other Topics Concern   Not on file  Social History Narrative   Not on file   Social Determinants of Health   Financial Resource Strain: Not on file  Food Insecurity: Not on file  Transportation Needs: Not on file  Physical Activity: Not on file  Stress: Not on file  Social Connections: Not on file  Intimate Partner Violence: Not on file    Outpatient Medications Prior to Visit  Medication Sig Dispense Refill   Accu-Chek Softclix Lancets lancets Use as directed up to 4 times daily 100 each 5   acetaminophen (TYLENOL) 650 MG CR tablet Take 1 tablet (650 mg total) by mouth every 8 (eight) hours as needed for pain.     Blood Glucose Monitoring Suppl (ACCU-CHEK GUIDE) w/Device KIT Use as directed 1 kit 0   lurasidone (LATUDA) 40 MG TABS tablet Take 1 tablet by mouth every evening with meals to start on or after 04/30/21 30 tablet 1   Multiple Vitamins-Minerals (ADULT ONE DAILY GUMMIES PO) Take 1 capsule by mouth daily.     chlorthalidone (HYGROTON) 25 MG tablet Take 1 tablet (25 mg total) by mouth daily. 90 tablet 1   docusate sodium (COLACE) 100 MG capsule Take 1 capsule (100 mg total) by mouth daily. 10 capsule 0   doxycycline (VIBRA-TABS) 100 MG tablet Take 1 tablet (100 mg total) by mouth every 12 (twelve) hours. 60 tablet 0   famotidine (PEPCID) 20 MG tablet Take 1 tablet (20 mg total) by mouth daily. 30 tablet 0   gabapentin (NEURONTIN) 100 MG capsule Take 1 capsule (100 mg total) by mouth at bedtime. 30 capsule 0   glucose blood (ACCU-CHEK GUIDE) test strip Use as instructed up to 4 times daily 100 each 12   HYDROcodone-acetaminophen  (NORCO/VICODIN) 5-325 MG tablet Take 1 tablet by mouth every 12 (twelve) hours as needed for severe pain. LIMIT TO ONE PILL DAILY. 30 tablet 0   insulin glargine (LANTUS SOLOSTAR) 100 UNIT/ML Solostar Pen Inject 45 Units into the skin daily. 15 mL 11   Insulin Pen Needle (PENTIPS) 32G X 4 MM MISC Use as directed with insulin pens 100 each  3   melatonin 3 MG TABS tablet Take 1 tablet (3 mg total) by mouth at bedtime as needed. 30 tablet 0   methocarbamol (ROBAXIN) 500 MG tablet Take 0.5 tablets (250 mg total) by mouth 2 (two) times daily as needed for muscle spasms. 30 tablet 0   Pancrelipase, Lip-Prot-Amyl, (ZENPEP) 20000-63000 units CPEP Take 1 capsule by mouth 3 (three) times daily before meals. 180 capsule 4   pantoprazole (PROTONIX) 40 MG tablet Take 1 tablet (40 mg total) by mouth daily. 30 tablet 0   polyethylene glycol powder (GLYCOLAX/MIRALAX) 17 GM/SCOOP powder Take 1 capful by mouth daily as needed for mild constipation. 238 g 0   rosuvastatin (CRESTOR) 10 MG tablet Take 1 tablet (10 mg total) by mouth daily. 90 tablet 1   Zinc Sulfate 220 (50 Zn) MG TABS Take 1 tablet (220 mg total) by mouth daily. 30 tablet 0   ascorbic acid (VITAMIN C) 1000 MG tablet Take 1 tablet (1,000 mg total) by mouth daily. (Patient not taking: Reported on 07/30/2021) 30 tablet 0   No facility-administered medications prior to visit.    Allergies  Allergen Reactions   Strawberry Extract Hives   Sulfa Antibiotics Hives and Itching    ROS Review of Systems  HENT: Negative.    Respiratory: Negative.    Cardiovascular:  Positive for leg swelling.  Gastrointestinal: Negative.   Genitourinary: Negative.   Neurological: Negative.   Psychiatric/Behavioral: Negative.       Objective:    Physical Exam Constitutional:      Appearance: She is normal weight.  HENT:     Head: Normocephalic and atraumatic.     Nose: Nose normal.     Mouth/Throat:     Mouth: Mucous membranes are moist.     Pharynx:  Oropharynx is clear.  Eyes:     Extraocular Movements: Extraocular movements intact.     Pupils: Pupils are equal, round, and reactive to light.  Cardiovascular:     Rate and Rhythm: Normal rate and regular rhythm.     Pulses: Normal pulses.     Heart sounds: Normal heart sounds.  Pulmonary:     Effort: Pulmonary effort is normal.     Breath sounds: Normal breath sounds.  Abdominal:     General: Abdomen is flat. Bowel sounds are normal.  Musculoskeletal:        General: Deformity present. Normal range of motion.     Cervical back: Normal range of motion.     Comments: Left BKA R foot:  dry ulcer base of R great toe,  multiple foot callus  Skin:    General: Skin is warm and dry.  Neurological:     General: No focal deficit present.     Mental Status: She is alert and oriented to person, place, and time. Mental status is at baseline.  Psychiatric:        Mood and Affect: Mood normal.        Behavior: Behavior normal.        Thought Content: Thought content normal.    BP 134/84   Pulse 77   Ht 5' 4" (1.626 m)   SpO2 100%   BMI 31.09 kg/m  Wt Readings from Last 3 Encounters:  07/15/21 181 lb 1.6 oz (82.1 kg)  06/27/21 200 lb (90.7 kg)  06/18/21 204 lb (92.5 kg)     Health Maintenance Due  Topic Date Due   COVID-19 Vaccine (3 - Pfizer risk series) 06/17/2020  There are no preventive care reminders to display for this patient.  Significant Diagnostic Studies:  Imaging Results  CT HEAD WO CONTRAST   Result Date: 06/25/2021 CLINICAL DATA:  Mental status change, alcohol/drug use. EXAM: CT HEAD WITHOUT CONTRAST TECHNIQUE: Contiguous axial images were obtained from the base of the skull through the vertex without intravenous contrast. COMPARISON:  CT head 01/03/2021 FINDINGS: Brain: No evidence of large-territorial acute infarction. No parenchymal hemorrhage. No mass lesion. No extra-axial collection. No mass effect or midline shift. No hydrocephalus. Basilar cisterns are  patent. Vascular: No hyperdense vessel. Skull: No acute fracture or focal lesion. Sinuses/Orbits: Right maxillary sinus mucosal thickening. Otherwise remaining visualized paranasal sinuses and mastoid air cells are clear. The orbits are unremarkable. Other: None. IMPRESSION: No acute intracranial abnormality. Electronically Signed   By: Iven Finn M.D.   On: 06/25/2021 21:59    CT FOOT LEFT WO CONTRAST   Result Date: 06/26/2021 CLINICAL DATA:  Foot swelling. EXAM: CT OF THE LEFT FOOT WITHOUT CONTRAST TECHNIQUE: Multidetector CT imaging of the left foot was performed according to the standard protocol. Multiplanar CT image reconstructions were also generated. COMPARISON:  None. FINDINGS: Study limited by lack of intravenous contrast material. Bones/Joint/Cartilage Gas is identified in the marrow space of multiple bones of the midfoot, including the inferior navicula, the cuboid, and the middle and lateral cuneiform bones. Gas is also seen in the base of the second, third, and fourth metatarsals. This is associated with apparent decreased mineralization in the same regions. Imaging features compatible with osteomyelitis. Ligaments Suboptimally assessed by CT. Muscles and Tendons Diffuse muscular and subcutaneous edema evident. Overlying skin thickening noted. Soft tissues There is diffuse soft tissue gas throughout the left foot extending up into the ankle. Extensive muscular and soft tissue edema. No discrete abscess evident although assessment is substantially limited by the lack of intravenous contrast material. IMPRESSION: 1. Extensive soft tissue gas in the foot and visualized portion of the ankle. Necrotizing fasciitis is a distinct concern. 2. Gas is identified in the marrow space of multiple bones of the midfoot consistent with associated osteomyelitis. 3. Extensive muscular and soft tissue edema without a discrete fluid collection/abscess evident although assessment is markedly limited by lack of  intravenous contrast material. 4. MRI of the foot with and without contrast recommended for more definitive assessment. These results will be called to the ordering clinician or representative by the Radiologist Assistant, and communication documented in the PACS or [employer]. Electronically Signed   By: Misty Stanley M.D.   On: 06/26/2021 06:52    CT FOOT RIGHT WO CONTRAST   Result Date: 06/26/2021 CLINICAL DATA:  Foot swelling. EXAM: CT OF THE RIGHT FOOT WITHOUT CONTRAST TECHNIQUE: Multidetector CT imaging of the right foot was performed according to the standard protocol. Multiplanar CT image reconstructions were also generated. COMPARISON:  None. FINDINGS: Study limited by lack of intravenous contrast material. Bones/Joint/Cartilage No evidence for an acute fracture or dislocation. No gross bony destruction to suggest overt osteomyelitis. Subtle cortical irregularity noted in the great toe (23/8), nonspecific. Ligaments Suboptimally assessed by CT. Muscles and Tendons Unremarkable. Soft tissues Subcutaneous edema noted, most pronounced in the midfoot and region of the MTP joints. No gas identified within the soft tissues. No gross discrete fluid collection evident. IMPRESSION: 1. Subcutaneous edema, most pronounced in the midfoot and region of the MTP joints. No gas identified within the soft tissues. No gross discrete fluid collection. 2. No gross bony destruction to suggest overt osteomyelitis. Subtle cortical  irregularity in the great toe, nonspecific. 3. If there is clinical concern for osteomyelitis, MRI of the foot with and without contrast recommended to further evaluate. MRI is also more sensitive in the detection of soft tissue abscesses. Electronically Signed   By: Misty Stanley M.D.   On: 06/26/2021 06:39    MR FOOT RIGHT WO CONTRAST   Result Date: 06/26/2021 CLINICAL DATA:  Foot swelling, diabetic, osteomyelitis suspected, no prior imaging EXAM: MRI OF THE RIGHT FOREFOOT WITHOUT  CONTRAST TECHNIQUE: Multiplanar, multisequence MR imaging of the right forefoot was performed. No intravenous contrast was administered. COMPARISON:  None. FINDINGS: Bones/Joint/Cartilage There is increased STIR signal throughout the great toe distal phalanx and partially within the proximal phalanx. There is some low T1 signal but not confluent. Additionally, there is edema signal throughout the middle cuneiform without visible fracture line. Ligaments Intact Lisfranc ligament.  No evidence of plantar plate tear Muscles and Tendons No significant muscle atrophy. Intramuscular edema within the intrinsic foot musculature, commonly seen in diabetics. Soft tissues Mild dorsal soft tissue swelling. There is swelling and possible ulcer along the medial aspect of the great toe. IMPRESSION: Edema signal with mild low T1 signal within the great toe distal phalanx and partially within the proximal phalanx, with possible adjacent soft tissue ulcer. Signal characteristics are compatible with either early osteomyelitis or reactive marrow signal change. Bony edema throughout the middle cuneiform without distinct fracture or confluent low T1 signal to suggest infection. This is favored to represent reactive marrow signal change. Electronically Signed   By: Maurine Simmering   On: 06/26/2021 15:58    PERIPHERAL VASCULAR CATHETERIZATION   Result Date: 06/17/2021 1.  No significant aortoiliac disease. 2.  Left lower extremity: No significant obstructive disease with three-vessel runoff below the knee with intact pedal arch. 3.  Right lower extremity: No significant obstructive disease with three-vessel runoff below the knee. Recommendations: The patient has mild nonobstructive disease.  Suspect that nonhealing ulcerations are due to uncontrolled diabetes.  She seems to have optimal blood flow.   DG Chest Port 1 View   Result Date: 06/27/2021 CLINICAL DATA:  Central line placement EXAM: PORTABLE CHEST 1 VIEW COMPARISON:  06/25/2021  FINDINGS: Right IJ central venous catheter tip is at the cavoatrial junction. No focal airspace consolidation or pulmonary edema. No pleural effusion or pneumothorax. IMPRESSION: Right IJ central venous catheter tip at the cavoatrial junction. Electronically Signed   By: Ulyses Jarred M.D.   On: 06/27/2021 03:24    DG Chest Port 1 View   Result Date: 06/25/2021 CLINICAL DATA:  Sepsis, hyperglycemia EXAM: PORTABLE CHEST 1 VIEW COMPARISON:  03/29/2021 FINDINGS: Single frontal view of the chest demonstrates stable dual lead pacer. Cardiac silhouette is enlarged but stable. No airspace disease, effusion, or pneumothorax. No acute bony abnormalities. IMPRESSION: 1. No acute intrathoracic process. Electronically Signed   By: Randa Ngo M.D.   On: 06/25/2021 18:20    DG Foot Complete Left   Result Date: 06/25/2021 CLINICAL DATA:  Hyperglycemia, question sepsis. Gangrenous left foot. EXAM: LEFT FOOT - COMPLETE 3+ VIEW COMPARISON:  None. FINDINGS: No cortical erosion or destruction. There is no evidence of fracture or dislocation. There is no evidence of arthropathy or other focal bone abnormality. Diffuse marked subcutaneus soft tissue edema and emphysema. Vascular calcifications. IMPRESSION: 1. Diffuse marked subcutaneus soft tissue edema and emphysema. Correlate clinically for necrotizing fasciitis. 2. No radiographic findings to suggest osteomyelitis. 3.  No acute displaced fracture or dislocation. Electronically Signed   By:  Iven Finn M.D.   On: 06/25/2021 18:20       Labs:  Basic Metabolic Panel: Last Labs      Recent Labs  Lab 07/13/21 0640  NA 136  K 4.6  CL 103  CO2 25  GLUCOSE 138*  BUN 15  CREATININE 1.05*  CALCIUM 8.7*        CBC: Last Labs   No results for input(s): WBC, NEUTROABS, HGB, HCT, MCV, PLT in the last 168 hours.     CBG: Last Labs          Recent Labs  Lab 07/13/21 1132 07/13/21 1629 07/13/21 2113 07/14/21 0532 07/14/21 1205  GLUCAP 173* 152* 215*  174* 194*     Lab Results  Component Value Date   TSH 4.396 02/11/2020   Lab Results  Component Value Date   WBC 10.0 07/06/2021   HGB 8.1 (L) 07/06/2021   HCT 26.6 (L) 07/06/2021   MCV 90.2 07/06/2021   PLT 332 07/06/2021   Lab Results  Component Value Date   NA 136 07/13/2021   K 4.6 07/13/2021   CO2 25 07/13/2021   GLUCOSE 138 (H) 07/13/2021   BUN 15 07/13/2021   CREATININE 1.05 (H) 07/13/2021   BILITOT 0.3 07/13/2021   ALKPHOS 71 07/13/2021   AST 22 07/13/2021   ALT 11 07/13/2021   PROT 7.3 07/13/2021   ALBUMIN 2.2 (L) 07/13/2021   CALCIUM 8.7 (L) 07/13/2021   ANIONGAP 8 07/13/2021   EGFR 32 (L) 06/18/2021   Lab Results  Component Value Date   CHOL 98 (L) 06/18/2021   Lab Results  Component Value Date   HDL 37 (L) 06/18/2021   Lab Results  Component Value Date   LDLCALC 40 06/18/2021   Lab Results  Component Value Date   TRIG 117 06/18/2021   Lab Results  Component Value Date   CHOLHDL 2.6 06/18/2021   Lab Results  Component Value Date   HGBA1C 13.2 (H) 07/02/2021      Assessment & Plan:   Problem List Items Addressed This Visit       Cardiovascular and Mediastinum   HYPERTENSION, BENIGN SYSTEMIC (Chronic)    Hypertension stable this time no change in medications      Relevant Medications   chlorthalidone (HYGROTON) 25 MG tablet   rosuvastatin (CRESTOR) 10 MG tablet   Chronic combined systolic and diastolic CHF (congestive heart failure) (HCC)    Compensated chronic systolic diastolic heart failure      Relevant Medications   chlorthalidone (HYGROTON) 25 MG tablet   rosuvastatin (CRESTOR) 10 MG tablet     Digestive   Exocrine pancreatic insufficiency    Exocrine pancreatic insufficiency continue Pancrease        Endocrine   Diabetes type 2, uncontrolled (HCC) (Chronic)    Severe diabetes type 2 uncontrolled referral to endocrinology the patient has Medicaid Increase Lantus to 60 units daily begin Humalog 5 units 3 times  daily with meals      Relevant Medications   insulin glargine (LANTUS SOLOSTAR) 100 UNIT/ML Solostar Pen   rosuvastatin (CRESTOR) 10 MG tablet   insulin lispro (HUMALOG) 100 UNIT/ML KwikPen   Diabetic foot ulcer (HCC)    Diabetic foot ulcer right lower extremity status post left BKA referral to wound care      Relevant Medications   insulin glargine (LANTUS SOLOSTAR) 100 UNIT/ML Solostar Pen   rosuvastatin (CRESTOR) 10 MG tablet   insulin lispro (HUMALOG) 100 UNIT/ML KwikPen  Other Relevant Orders   For home use only DME Other see comment   AMB referral to wound care center   CBC with Differential/Platelet     Genitourinary   RESOLVED: AKI (acute kidney injury) ([place])    Acute renal failure resolved        Other   Hypercholesteremia    Continue cholesterol therapy      Relevant Medications   chlorthalidone (HYGROTON) 25 MG tablet   rosuvastatin (CRESTOR) 10 MG tablet   Other Visit Diagnoses     Type 2 diabetes mellitus with diabetic polyneuropathy, with long-term current use of insulin (HCC)    -  Primary   Relevant Medications   gabapentin (NEURONTIN) 100 MG capsule   insulin glargine (LANTUS SOLOSTAR) 100 UNIT/ML Solostar Pen   methocarbamol (ROBAXIN) 500 MG tablet   rosuvastatin (CRESTOR) 10 MG tablet   insulin lispro (HUMALOG) 100 UNIT/ML KwikPen   Other Relevant Orders   POCT glucose (manual entry)   Comprehensive metabolic panel   Ambulatory referral to Endocrinology       Meds ordered this encounter  Medications   chlorthalidone (HYGROTON) 25 MG tablet    Sig: Take 1 tablet (25 mg total) by mouth daily.    Dispense:  90 tablet    Refill:  1   docusate sodium (COLACE) 100 MG capsule    Sig: Take 1 capsule (100 mg total) by mouth daily.    Dispense:  10 capsule    Refill:  0   doxycycline (VIBRA-TABS) 100 MG tablet    Sig: Take 1 tablet (100 mg total) by mouth every 12 (twelve) hours.    Dispense:  60 tablet    Refill:  0   famotidine (PEPCID) 20  MG tablet    Sig: Take 1 tablet (20 mg total) by mouth daily.    Dispense:  30 tablet    Refill:  0   gabapentin (NEURONTIN) 100 MG capsule    Sig: Take 1 capsule (100 mg total) by mouth at bedtime.    Dispense:  30 capsule    Refill:  0   glucose blood (ACCU-CHEK GUIDE) test strip    Sig: Use as instructed up to 4 times daily    Dispense:  100 each    Refill:  12   DISCONTD: HYDROcodone-acetaminophen (NORCO/VICODIN) 5-325 MG tablet    Sig: Take 1 tablet by mouth every 12 (twelve) hours as needed for severe pain. LIMIT TO ONE PILL DAILY.    Dispense:  30 tablet    Refill:  0   insulin glargine (LANTUS SOLOSTAR) 100 UNIT/ML Solostar Pen    Sig: Inject 60 Units into the skin daily.    Dispense:  15 mL    Refill:  11   Insulin Pen Needle (TECHLITE PEN NEEDLES) 32G X 4 MM MISC    Sig: Use as directed with insulin pens    Dispense:  100 each    Refill:  3   melatonin 3 MG TABS tablet    Sig: Take 1 tablet (3 mg total) by mouth at bedtime as needed.    Dispense:  30 tablet    Refill:  0   methocarbamol (ROBAXIN) 500 MG tablet    Sig: Take 0.5 tablets (250 mg total) by mouth 2 (two) times daily as needed for muscle spasms.    Dispense:  30 tablet    Refill:  0   Pancrelipase, Lip-Prot-Amyl, (ZENPEP) 20000-63000 units CPEP    Sig: Take 1 capsule  by mouth 3 (three) times daily before meals.    Dispense:  180 capsule    Refill:  4   pantoprazole (PROTONIX) 40 MG tablet    Sig: Take 1 tablet (40 mg total) by mouth daily.    Dispense:  30 tablet    Refill:  0   polyethylene glycol powder (GLYCOLAX/MIRALAX) 17 GM/SCOOP powder    Sig: Take 1 capful by mouth daily as needed for mild constipation.    Dispense:  238 g    Refill:  0   rosuvastatin (CRESTOR) 10 MG tablet    Sig: Take 1 tablet (10 mg total) by mouth daily.    Dispense:  90 tablet    Refill:  1   Zinc Sulfate 220 (50 Zn) MG TABS    Sig: Take 1 tablet (220 mg total) by mouth daily.    Dispense:  30 tablet    Refill:  0    insulin lispro (HUMALOG) 100 UNIT/ML KwikPen    Sig: Inject 5 Units into the skin 3 (three) times daily before meals.    Dispense:  6 mL    Refill:  3   DISCONTD: HYDROcodone-acetaminophen (NORCO/VICODIN) 5-325 MG tablet    Sig: Take 1 tablet by mouth every 12 (twelve) hours as needed for severe pain. LIMIT TO ONE PILL DAILY.    Dispense:  30 tablet    Refill:  0    Follow-up: Return in about 6 weeks (around 09/10/2021).    Asencion Noble, MD

## 2021-07-30 NOTE — Telephone Encounter (Signed)
Forgot to ask you today: patient wants a PCS referral.  She now has medicaid

## 2021-07-30 NOTE — Assessment & Plan Note (Signed)
Hypertension stable this time no change in medications

## 2021-07-30 NOTE — Telephone Encounter (Signed)
Pt has appointment with PCP today to discuss.

## 2021-07-30 NOTE — Telephone Encounter (Signed)
Referral Request - Has patient seen PCP for this complaint? yes *If NO, is insurance requiring patient see PCP for this issue before PCP can refer them? Referral for which specialty: pcs services Preferred provider/office: N/A Reason for referral: help with home responsibilites, has problem with knee

## 2021-07-31 ENCOUNTER — Telehealth: Payer: Self-pay

## 2021-07-31 ENCOUNTER — Other Ambulatory Visit: Payer: Self-pay | Admitting: Critical Care Medicine

## 2021-07-31 ENCOUNTER — Other Ambulatory Visit: Payer: Self-pay

## 2021-07-31 LAB — CBC WITH DIFFERENTIAL/PLATELET
Basophils Absolute: 0 10*3/uL (ref 0.0–0.2)
Basos: 0 %
EOS (ABSOLUTE): 0.1 10*3/uL (ref 0.0–0.4)
Eos: 2 %
Hematocrit: 26.4 % — ABNORMAL LOW (ref 34.0–46.6)
Hemoglobin: 8.3 g/dL — ABNORMAL LOW (ref 11.1–15.9)
Immature Grans (Abs): 0 10*3/uL (ref 0.0–0.1)
Immature Granulocytes: 0 %
Lymphocytes Absolute: 1.6 10*3/uL (ref 0.7–3.1)
Lymphs: 20 %
MCH: 26.7 pg (ref 26.6–33.0)
MCHC: 31.4 g/dL — ABNORMAL LOW (ref 31.5–35.7)
MCV: 85 fL (ref 79–97)
Monocytes Absolute: 0.4 10*3/uL (ref 0.1–0.9)
Monocytes: 5 %
Neutrophils Absolute: 5.8 10*3/uL (ref 1.4–7.0)
Neutrophils: 73 %
Platelets: 342 10*3/uL (ref 150–450)
RBC: 3.11 x10E6/uL — ABNORMAL LOW (ref 3.77–5.28)
RDW: 15.9 % — ABNORMAL HIGH (ref 11.7–15.4)
WBC: 7.9 10*3/uL (ref 3.4–10.8)

## 2021-07-31 LAB — COMPREHENSIVE METABOLIC PANEL
ALT: 7 IU/L (ref 0–32)
AST: 12 IU/L (ref 0–40)
Albumin/Globulin Ratio: 0.9 — ABNORMAL LOW (ref 1.2–2.2)
Albumin: 3.4 g/dL — ABNORMAL LOW (ref 3.8–4.8)
Alkaline Phosphatase: 90 IU/L (ref 44–121)
BUN/Creatinine Ratio: 23 (ref 9–23)
BUN: 18 mg/dL (ref 6–24)
Bilirubin Total: <0.2 mg/dL (ref 0.0–1.2)
CO2: 20 mmol/L (ref 20–29)
Calcium: 9.3 mg/dL (ref 8.7–10.2)
Chloride: 101 mmol/L (ref 96–106)
Creatinine, Ser: 0.8 mg/dL (ref 0.57–1.00)
Globulin, Total: 3.8 g/dL (ref 1.5–4.5)
Glucose: 249 mg/dL — ABNORMAL HIGH (ref 65–99)
Potassium: 4.3 mmol/L (ref 3.5–5.2)
Sodium: 135 mmol/L (ref 134–144)
Total Protein: 7.2 g/dL (ref 6.0–8.5)
eGFR: 91 mL/min/{1.73_m2} (ref >59)

## 2021-07-31 MED ORDER — IRON (FERROUS SULFATE) 325 (65 FE) MG PO TABS
325.0000 mg | ORAL_TABLET | Freq: Two times a day (BID) | ORAL | 2 refills | Status: DC
  Filled 2021-07-31: qty 60, fill #0

## 2021-07-31 NOTE — Telephone Encounter (Signed)
Pt was called and a VM was left informing patient of lab results. 

## 2021-07-31 NOTE — Telephone Encounter (Signed)
-----   Message from [name], MD sent at 07/31/2021  1:26 PM EDT ----- Let pt know she is still anemic I sent iron supplement to our pharamcy   All other labs ok

## 2021-08-03 ENCOUNTER — Ambulatory Visit (INDEPENDENT_AMBULATORY_CARE_PROVIDER_SITE_OTHER): Payer: Medicaid Other

## 2021-08-03 DIAGNOSIS — I442 Atrioventricular block, complete: Secondary | ICD-10-CM

## 2021-08-04 LAB — CUP PACEART REMOTE DEVICE CHECK
Battery Remaining Longevity: 100 mo
Battery Remaining Percentage: 87 %
Battery Voltage: 2.99 V
Brady Statistic AP VP Percent: 1 %
Brady Statistic AP VS Percent: 1 %
Brady Statistic AS VP Percent: 99 %
Brady Statistic AS VS Percent: 1 %
Brady Statistic RA Percent Paced: 1 %
Brady Statistic RV Percent Paced: 99 %
Date Time Interrogation Session: 20220829160126
Implantable Lead Implant Date: 20210310
Implantable Lead Implant Date: 20210310
Implantable Lead Location: 753859
Implantable Lead Location: 753860
Implantable Pulse Generator Implant Date: 20210310
Lead Channel Impedance Value: 430 Ohm
Lead Channel Impedance Value: 440 Ohm
Lead Channel Pacing Threshold Amplitude: 0.75 V
Lead Channel Pacing Threshold Amplitude: 0.75 V
Lead Channel Pacing Threshold Pulse Width: 0.5 ms
Lead Channel Pacing Threshold Pulse Width: 0.5 ms
Lead Channel Sensing Intrinsic Amplitude: 12 mV
Lead Channel Sensing Intrinsic Amplitude: 4.3 mV
Lead Channel Setting Pacing Amplitude: 1 V
Lead Channel Setting Pacing Amplitude: 1.75 V
Lead Channel Setting Pacing Pulse Width: 0.5 ms
Lead Channel Setting Sensing Sensitivity: 2 mV
Pulse Gen Model: 2272
Pulse Gen Serial Number: 3802095

## 2021-08-05 ENCOUNTER — Other Ambulatory Visit: Payer: Self-pay

## 2021-08-05 ENCOUNTER — Encounter: Payer: Self-pay | Admitting: Physical Medicine and Rehabilitation

## 2021-08-05 ENCOUNTER — Encounter
Payer: Medicaid Other | Attending: Physical Medicine and Rehabilitation | Admitting: Physical Medicine and Rehabilitation

## 2021-08-05 VITALS — BP 123/61 | HR 91 | Temp 98.1°F | Ht 64.0 in | Wt 181.0 lb

## 2021-08-05 DIAGNOSIS — Z89512 Acquired absence of left leg below knee: Secondary | ICD-10-CM | POA: Insufficient documentation

## 2021-08-05 DIAGNOSIS — E1142 Type 2 diabetes mellitus with diabetic polyneuropathy: Secondary | ICD-10-CM | POA: Diagnosis not present

## 2021-08-05 MED ORDER — GABAPENTIN 300 MG PO CAPS
300.0000 mg | ORAL_CAPSULE | Freq: Three times a day (TID) | ORAL | 3 refills | Status: DC
  Filled 2021-08-05: qty 90, 30d supply, fill #0

## 2021-08-05 NOTE — Patient Instructions (Signed)
Constipation:  -Provided list of following foods that help with constipation and highlighted a few: 1) prunes- contain high amounts of fiber.  2) apples- has a form of dietary fiber called pectin that accelerates stool movement and increases beneficial gut bacteria 3) pears- in addition to fiber, also high in fructose and sorbitol which have laxative effect 4) figs- contain an enzyme ficin which helps to speed colonic transit 5) kiwis- contain an enzyme actinidin that improves gut motility and reduces constipation 6) oranges- rich in pectin (like apples) 7) grapefruits- contain a flavanol naringenin which has a laxative effect 8) vegetables- rich in fiber and also great sources of folate, vitamin C, and K 9) artichoke- high in inulin, prebiotic great for the microbiome 10) chicory- increases stool frequency and softness (can be added to coffee) 11) rhubarb- laxative effect 12) sweet potato- high fiber 13) beans, peas, and lentils- contain both soluble and insoluble fiber 14) chia seeds- improves intestinal health and gut flora 15) flaxseeds- laxative effect 16) whole grain rye bread- high in fiber 17) oat bran- high in soluble and insoluble fiber 18) kefir- softens stools -recommended to try at least one of these foods every day.  -drink 6-8 glasses of water per day -walk regularly, especially after meals.      

## 2021-08-05 NOTE — Progress Notes (Addendum)
Subjective:    Patient ID: Sarah Long, female    DOB: [birth year], 50 y.o.   MRN: [id number]  HPI Mrs. Dowty is a 50 year old woman who presents for follow-up of left BKA.   1) Type 2 DM -she has an appointment with an endocrinologist -she has tingling and numbness in her right foot -she is getting phantom limb pain in left leg.  -she has follow-up with Dr. Sharol Given  and PCP -she is willing to try nerve medications  2) Constipation: -she does not lie the Miralax -she is worried about being constipated wit the iron.   Pain Inventory Average Pain 6 Pain Right Now 7 My pain is stabbing and aching  In the last 24 hours, has pain interfered with the following? General activity 6 Relation with others 6 Enjoyment of life 7 What TIME of day is your pain at its worst? evening Sleep (in general) Poor  Pain is worse with: inactivity Pain improves with: rest and therapy/exercise Relief from Meds: 6  use a walker how many minutes can you walk? 5 ability to climb steps?  no  disabled: date disabled 06/27/2021  numbness tingling spasms depression anxiety  N/a  N/a    Family History  Problem Relation Age of Onset   Hypertension Mother    Diabetes Mother    Heart disease Mother    Hypertension Father    Diabetes Father    Heart disease Father    Stroke Maternal Grandfather    Other Neg Hx    Social History   Socioeconomic History   Marital status: Married    Spouse name: Not on file   Number of children: Not on file   Years of education: Not on file   Highest education level: Not on file  Occupational History   Not on file  Tobacco Use   Smoking status: Former    Packs/day: 0.50    Years: 20.00    Pack years: 10.00    Types: Cigarettes   Smokeless tobacco: Never  Vaping Use   Vaping Use: Never used  Substance and Sexual Activity   Alcohol use: No   Drug use: No   Sexual activity: Yes    Birth control/protection: None  Other Topics Concern   Not on  file  Social History Narrative   Not on file   Social Determinants of Health   Financial Resource Strain: Not on file  Food Insecurity: Not on file  Transportation Needs: Not on file  Physical Activity: Not on file  Stress: Not on file  Social Connections: Not on file   Past Surgical History:  Procedure Laterality Date   ABDOMINAL AORTOGRAM W/LOWER EXTREMITY N/A 06/17/2021   Procedure: ABDOMINAL AORTOGRAM W/LOWER EXTREMITY;  Surgeon: Wellington Hampshire, MD;  Location: [facility];  Service: Cardiovascular;  Laterality: N/A;   AMPUTATION Left 06/27/2021   Procedure: AMPUTATION BELOW KNEE;  Surgeon: Newt Minion, MD;  Location: [facility];  Service: Orthopedics;  Laterality: Left;   CESAREAN SECTION  1991   LEFT HEART CATH AND CORONARY ANGIOGRAPHY N/A 02/10/2020   Procedure: LEFT HEART CATH AND CORONARY ANGIOGRAPHY;  Surgeon: Troy Sine, MD;  Location: [facility];  Service: Cardiovascular;  Laterality: N/A;   PACEMAKER IMPLANT N/A 02/13/2020   Procedure: PACEMAKER IMPLANT;  Surgeon: Constance Haw, MD;  Location: [facility];  Service: Cardiovascular;  Laterality: N/A;   TEMPORARY PACEMAKER N/A 02/10/2020   Procedure: TEMPORARY PACEMAKER;  Surgeon: Troy Sine, MD;  Location: [place] [facility];  Service: Cardiovascular;  Laterality: N/A;   Past Medical History:  Diagnosis Date   Abnormal Pap smear 1998   Abnormal vaginal bleeding 12/19/2011   Acute urinary retention 06/26/2021   Bacterial infection    Bipolar 1 disorder (HCC)    Blister of second toe of left foot 11/21/2016   Candida vaginitis 07/2007   Depression    recently added wellbutrin-has not taken yet for bipolar   Diabetes in pregnancy    Diabetes mellitus    nph 20U qam and qpm, regular with meals   Diabetic ketoacidosis without coma associated with type 2 diabetes mellitus ([place])    Fibroid    Galactorrhea of right breast 2008   H/O amenorrhea 07/2007   H/O dizziness 10/14/2011   H/O  dysmenorrhea 2010   H/O menorrhagia 10/14/2011   H/O varicella    Headache(784.0)    Heavy vaginal bleeding due to contraceptive injection use 10/12/2011   Depo Provera   Herpes    HSV-2 infection 01/03/2009   Hx: UTI (urinary tract infection) 2009   Hypertension    on aldomet   Increased BMI 2010   Irregular uterine bleeding 04/04/2012   Pt has mirena    Obesity 10/14/2011   Oligomenorrhea 07/2007   Pelvic pain in female    Presence of permanent cardiac pacemaker    Preterm labor    Trichomonas    Yeast infection    BP 123/61   Pulse 91   Temp 98.1 F (36.7 C) (Oral)   Ht '5\' 4"'$  (1.626 m)   Wt 181 lb (82.1 kg)   SpO2 97%   BMI 31.07 kg/m   Opioid Risk Score:   Fall Risk Score:  `1  Depression screen PHQ 2/9  Depression screen [facility] 2/9 07/30/2021 06/18/2021 09/09/2020 07/16/2020 07/29/2019 07/10/2019 06/27/2019  Decreased Interest 1 2 0 1 0 2 0  Down, Depressed, Hopeless '1 2 2 2 '$ 0 2 0  PHQ - 2 Score '2 4 2 3 '$ 0 4 0  Altered sleeping '3 3 2 3 1 3 '$ 0  Tired, decreased energy 0 '2 2 1 1 2 '$ 0  Change in appetite 0 2 0 3 0 0 0  Feeling bad or failure about yourself  0 1 0 2 0 1 0  Trouble concentrating 1 0 0 0 0 0 0  Moving slowly or fidgety/restless 0 1 0 0 0 0 0  Suicidal thoughts 0 0 0 0 0 0 0  PHQ-9 Score '6 13 6 12 2 10 '$ 0  Difficult doing work/chores Not difficult at all - Not difficult at all - - Somewhat difficult -  Some recent data might be hidden      Review of Systems  Constitutional: Negative.   HENT: Negative.    Eyes: Negative.   Respiratory: Negative.    Cardiovascular: Negative.   Gastrointestinal: Negative.   Endocrine: Negative.   Genitourinary: Negative.   Musculoskeletal:        Pain in left leg  Skin: Negative.   Allergic/Immunologic: Negative.   Neurological:  Positive for weakness and numbness.  Hematological: Negative.   Psychiatric/Behavioral:  Positive for dysphoric mood. The patient is nervous/anxious.       Objective:   Physical  Exam Gen: no distress, normal appearing HEENT: oral mucosa pink and moist, NCAT Cardio: Reg rate Chest: normal effort, normal rate of breathing Abd: soft, non-distended Ext: no edema Psych: pleasant, normal affect Skin:  intact Neuro: Alert and oriented x3 Musculoskeletal: Left BKA, in WC     Assessment & Plan:  Mrs. Becker is a 50 year old woman who presents for hospital follow-up after CIR admission for left BKA  1) L BKA -continue follow-up with Dr. Sharol Given, healing well -increase gabapentin '300mg'$  TID -continue tapping and rubbing   2) Type 2 DM with peripheral neuropathy -Discussed Qutenza as an option for neuropathic pain control. Discussed that this is a capsaicin patch, stronger than capsaicin cream. Discussed that it is currently approved for diabetic peripheral neuropathy and post-herpetic neuralgia, but that it has also shown benefit in treating other forms of neuropathy. Provided patient with link to site to learn more about the patch: [employer]. Discussed that the patch would be placed in office and benefits usually last 3 months. Discussed that unintended exposure to capsaicin can cause severe irritation of eyes, mucous membranes, respiratory tract, and skin, but that Qutenza is a local treatment and does not have the systemic side effects of other nerve medications. Discussed that there may be pain, itching, erythema, and decreased sensory function associated with the application of Qutenza. Side effects usually subside within 1 week. A cold pack of analgesic medications can help with these side effects. Blood pressure can also be increased due to pain associated with administration of the patch.  Recommended 1 glass water with 1 TB apple cider vinegar before meals to reduce CBG spike, has additional health benefits, drink with straw to protect enamel.    2) Anemia -discussed what it means to be anemic -discussed sources of iron rih fods.   3)Constipation:   -Provided list of following foods that help with constipation and highlighted a few: 1) prunes- contain high amounts of fiber.  2) apples- has a form of dietary fiber called pectin that accelerates stool movement and increases beneficial gut bacteria 3) pears- in addition to fiber, also high in fructose and sorbitol which have laxative effect 4) figs- contain an enzyme ficin which helps to speed colonic transit 5) kiwis- contain an enzyme actinidin that improves gut motility and reduces constipation 6) oranges- rich in pectin (like apples) 7) grapefruits- contain a flavanol naringenin which has a laxative effect 8) vegetables- rich in fiber and also great sources of folate, vitamin C, and K 9) artichoke- high in inulin, prebiotic great for the microbiome 10) chicory- increases stool frequency and softness (can be added to coffee) 11) rhubarb- laxative effect 12) sweet potato- high fiber 13) beans, peas, and lentils- contain both soluble and insoluble fiber 14) chia seeds- improves intestinal health and gut flora 15) flaxseeds- laxative effect 16) whole grain rye bread- high in fiber 17) oat bran- high in soluble and insoluble fiber 18) kefir- softens stools -recommended to try at least one of these foods every day.  -drink 6-8 glasses of water per day -walk regularly, especially after meals.

## 2021-08-06 ENCOUNTER — Telehealth: Payer: Self-pay

## 2021-08-06 NOTE — Telephone Encounter (Signed)
Call placed to [facility], spoke to [place] who stated that they received the [facility] referral and are getting ready to contact the patient to schedule an assessment.

## 2021-08-07 ENCOUNTER — Encounter: Payer: Self-pay | Admitting: Physical Medicine and Rehabilitation

## 2021-08-11 ENCOUNTER — Ambulatory Visit (INDEPENDENT_AMBULATORY_CARE_PROVIDER_SITE_OTHER): Payer: Medicaid Other | Admitting: Physician Assistant

## 2021-08-11 ENCOUNTER — Other Ambulatory Visit: Payer: Self-pay

## 2021-08-11 ENCOUNTER — Encounter: Payer: Self-pay | Admitting: Orthopedic Surgery

## 2021-08-11 DIAGNOSIS — Z89512 Acquired absence of left leg below knee: Secondary | ICD-10-CM

## 2021-08-11 NOTE — Progress Notes (Signed)
Office Visit Note   [name]           Date of Birth: 25-May-1971           MRN: [id number] Visit Date: 08/11/2021              Requested by: [name], MD [address],  [place] [address] PCP: [name], MD  Chief Complaint  Patient presents with   Left Knee - Follow-up      HPI: Presents today 2-1/59-monthstatus post left below-knee amputation.  She is working with [employer] [facility] [employer] prosthetic.  She is also asking for a prescription for extra-depth shoe for her right side.  She is followed by wound care for a chronic ulcer to her great toe she is set to see them tomorrow.  Assessment & Plan: Visit Diagnoses:  1. Left below-knee amputee ([facility]     Plan: Prescription was provided for extra-depth shoe for the right side by Dr. DSharol Given  We will order physical therapy follow-up in 3 weeks  Follow-Up Instructions: No follow-ups on file.   Ortho Exam  Patient is alert, oriented, no adenopathy, well-dressed, normal affect, normal respiratory effort. Examination left below-knee amputation stump well-healed surgical incision.  No cellulitis swelling is well controlled no drainage.  Right foot she has a palpable dorsalis pedis pulse she has a chronic necrotic ulcer on the medial side of the great toe.  She says this is taken care of by wound care.  She is due to see them tomorrow.  Also small callus beneath the first MTP head was trimmed.  No evidence of acute infection  Imaging: No results found. No images are attached to the encounter.  Labs: Lab Results  Component Value Date   HGBA1C 13.2 (H) 07/02/2021   HGBA1C 13.6 (A) 06/18/2021   HGBA1C 8.4 (A) 05/15/2020   LABURIC 4.1 10/21/2016   LABURIC 4.2 11/20/2010   REPTSTATUS 07/12/2021 FINAL 07/11/2021   GRAMSTAIN  02/13/2013    RARE WBC PRESENT, PREDOMINANTLY MONONUCLEAR RARE SQUAMOUS EPITHELIAL CELLS PRESENT RARE GRAM POSITIVE COCCI IN CLUSTERS IN PAIRS   CULT (A) 07/11/2021     <10,000 COLONIES/mL INSIGNIFICANT GROWTH Performed at [facility] [id number], [place] [address]   LABORGA KLEBSIELLA PNEUMONIAE (A) 05/19/2021     Lab Results  Component Value Date   ALBUMIN 3.4 (L) 07/30/2021   ALBUMIN 2.2 (L) 07/13/2021   ALBUMIN 1.5 (L) 07/02/2021    Lab Results  Component Value Date   MG 1.7 06/28/2021   MG 1.8 06/27/2021   MG 1.7 06/27/2021   Lab Results  Component Value Date   VD25OH 24.1 (L) 03/11/2020   VD25OH <4.20 (L) 02/15/2020    No results found for: PREALBUMIN CBC EXTENDED Latest Ref Rng & Units 07/30/2021 07/06/2021 07/02/2021  WBC 3.4 - 10.8 x10E3/uL 7.9 10.0 12.5(H)  RBC 3.77 - 5.28 x10E6/uL 3.11(L) 2.95(L) 2.82(L)  HGB 11.1 - 15.9 g/dL 8.3(L) 8.1(L) 7.6(L)  HCT 34.0 - 46.6 % 26.4(L) 26.6(L) 25.6(L)  PLT 150 - 450 x10E3/uL 342 332 300  NEUTROABS 1.4 - 7.0 x10E3/uL 5.8 - 9.6(H)  LYMPHSABS 0.7 - 3.1 x10E3/uL 1.6 - 2.0     There is no height or weight on file to calculate BMI.  Orders:  Orders Placed This Encounter  Procedures   Ambulatory referral to Physical Therapy   No orders of the defined types were placed in this encounter.  Procedures: No procedures performed  Clinical Data: No additional findings.  ROS:  All other systems negative, except as noted in the HPI. Review of Systems  Objective: Vital Signs: There were no vitals taken for this visit.  Specialty Comments:  No specialty comments available.  PMFS History: Patient Active Problem List   Diagnosis Date Noted   Left below-knee amputee ([place]) 07/01/2021   Protein-calorie malnutrition ([place]) 06/26/2021   Hypoalbuminemia due to protein-calorie malnutrition ([place]) 06/26/2021   Normocytic anemia 06/26/2021   Heart failure with mid-range ejection fraction ([place]) 06/26/2021   Diabetic nephropathy with proteinuria ([place]) 06/26/2021   Diabetic foot ulcer ([place]) 05/26/2021   Bacterial vaginosis 07/17/2020   Dental caries 04/01/2020   Cracked  tooth 04/01/2020   Alcohol use 03/11/2020   Vitamin D deficiency 03/11/2020   Prolonged QT interval 02/26/2020   Chronic combined systolic and diastolic CHF (congestive heart failure) ([place]) 02/26/2020   Heart block AV complete ([place]) 02/11/2020   Exocrine pancreatic insufficiency    Dysmenorrhea 10/02/2019   DM type 2 with diabetic peripheral neuropathy ([place]) 05/30/2019   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 block) 05/29/2019   Bilateral bunions 05/29/2019   Diabetes type 2, uncontrolled ([place]) 10/21/2016   Smoking 11/26/2013   Hypercholesteremia 02/02/2007   Bipolar 1 disorder ([place]) 02/02/2007   HYPERTENSION, BENIGN SYSTEMIC 02/02/2007   Past Medical History:  Diagnosis Date   Abnormal Pap smear 1998   Abnormal vaginal bleeding 12/19/2011   Acute urinary retention 06/26/2021   Bacterial infection    Bipolar 1 disorder ([place])    Blister of second toe of left foot 11/21/2016   Candida vaginitis 07/2007   Depression    recently added wellbutrin-has not taken yet for bipolar   Diabetes in pregnancy    Diabetes mellitus    nph 20U qam and qpm, regular with meals   Diabetic ketoacidosis without coma associated with type 2 diabetes mellitus ([place])    Fibroid    Galactorrhea of right breast 2008   H/O amenorrhea 07/2007   H/O dizziness 10/14/2011   H/O dysmenorrhea 2010   H/O menorrhagia 10/14/2011   H/O varicella    Headache(784.0)    Heavy vaginal bleeding due to contraceptive injection use 10/12/2011   Depo Provera   Herpes    HSV-2 infection 01/03/2009   Hx: UTI (urinary tract infection) 2009   Hypertension    on aldomet   Increased BMI 2010   Irregular uterine bleeding 04/04/2012   Pt has mirena    Obesity 10/14/2011   Oligomenorrhea 07/2007   Pelvic pain in female    Presence of permanent cardiac pacemaker    Preterm labor    Trichomonas    Yeast infection     Family History  Problem Relation Age of Onset   Hypertension Mother     Diabetes Mother    Heart disease Mother    Hypertension Father    Diabetes Father    Heart disease Father    Stroke Maternal Grandfather    Other Neg Hx     Past Surgical History:  Procedure Laterality Date   ABDOMINAL AORTOGRAM W/LOWER EXTREMITY N/A 06/17/2021   Procedure: ABDOMINAL AORTOGRAM W/LOWER EXTREMITY;  Surgeon: Wellington Hampshire, MD;  Location: [place] [facility];  Service: Cardiovascular;  Laterality: N/A;   AMPUTATION Left 06/27/2021   Procedure: AMPUTATION BELOW KNEE;  Surgeon: Newt Minion, MD;  Location: [facility];  Service: Orthopedics;  Laterality: Left;   [place]  LEFT HEART CATH AND CORONARY ANGIOGRAPHY N/A 02/10/2020   Procedure: LEFT HEART CATH AND CORONARY ANGIOGRAPHY;  Surgeon: Troy Sine, MD;  Location: [place] [facility];  Service: Cardiovascular;  Laterality: N/A;   PACEMAKER IMPLANT N/A 02/13/2020   Procedure: PACEMAKER IMPLANT;  Surgeon: Constance Haw, MD;  Location: [place] [facility];  Service: Cardiovascular;  Laterality: N/A;   TEMPORARY PACEMAKER N/A 02/10/2020   Procedure: TEMPORARY PACEMAKER;  Surgeon: Troy Sine, MD;  Location: [place];  Service: Cardiovascular;  Laterality: N/A;   Social History   Occupational History   Not on file  Tobacco Use   Smoking status: Former    Packs/day: 0.50    Years: 20.00    Pack years: 10.00    Types: Cigarettes   Smokeless tobacco: Never  Vaping Use   Vaping Use: Never used  Substance and Sexual Activity   Alcohol use: No   Drug use: No   Sexual activity: Yes    Birth control/protection: None

## 2021-08-12 ENCOUNTER — Other Ambulatory Visit: Payer: Self-pay

## 2021-08-12 ENCOUNTER — Encounter (HOSPITAL_BASED_OUTPATIENT_CLINIC_OR_DEPARTMENT_OTHER): Payer: Medicaid Other | Attending: Physician Assistant | Admitting: Physician Assistant

## 2021-08-12 ENCOUNTER — Telehealth: Payer: Self-pay | Admitting: *Deleted

## 2021-08-12 ENCOUNTER — Other Ambulatory Visit: Payer: Self-pay | Admitting: Physical Medicine and Rehabilitation

## 2021-08-12 DIAGNOSIS — F17200 Nicotine dependence, unspecified, uncomplicated: Secondary | ICD-10-CM | POA: Diagnosis not present

## 2021-08-12 DIAGNOSIS — L97529 Non-pressure chronic ulcer of other part of left foot with unspecified severity: Secondary | ICD-10-CM | POA: Insufficient documentation

## 2021-08-12 DIAGNOSIS — E1169 Type 2 diabetes mellitus with other specified complication: Secondary | ICD-10-CM | POA: Diagnosis not present

## 2021-08-12 DIAGNOSIS — I10 Essential (primary) hypertension: Secondary | ICD-10-CM | POA: Insufficient documentation

## 2021-08-12 DIAGNOSIS — E1143 Type 2 diabetes mellitus with diabetic autonomic (poly)neuropathy: Secondary | ICD-10-CM | POA: Insufficient documentation

## 2021-08-12 DIAGNOSIS — L97512 Non-pressure chronic ulcer of other part of right foot with fat layer exposed: Secondary | ICD-10-CM | POA: Insufficient documentation

## 2021-08-12 DIAGNOSIS — E11621 Type 2 diabetes mellitus with foot ulcer: Secondary | ICD-10-CM | POA: Insufficient documentation

## 2021-08-12 DIAGNOSIS — Z950 Presence of cardiac pacemaker: Secondary | ICD-10-CM | POA: Insufficient documentation

## 2021-08-12 DIAGNOSIS — M86671 Other chronic osteomyelitis, right ankle and foot: Secondary | ICD-10-CM | POA: Diagnosis not present

## 2021-08-12 MED ORDER — FLUCONAZOLE 100 MG PO TABS
100.0000 mg | ORAL_TABLET | Freq: Every day | ORAL | 0 refills | Status: AC
  Filled 2021-08-12: qty 14, 14d supply, fill #0

## 2021-08-12 NOTE — Progress Notes (Addendum)
Sarah Long ([id number]) Visit Report for 08/12/2021 Chief Complaint Document Details Patient Name: Date of Service: Sarah, Long 08/12/2021 10:45 A M Medical Record Number: [id number] Patient Account Number: [id number] Date of Birth/Sex: Treating RN: [birth year] (50 y.o. [name] Primary Care Provider: Asencion Noble Other Clinician: Referring Provider: Treating Provider/Extender: Emelda Brothers in Treatment: 11 Information Obtained from: Patient Chief Complaint Bilateral toe ulcers Electronic Signature(s) Signed: 08/12/2021 11:08:07 AM By: [name] PA-C Entered By: [name] on 08/12/2021 11:08:07 -------------------------------------------------------------------------------- Debridement Details Patient Name: Date of Service: Sarah Haring R. 08/12/2021 10:45 A M Medical Record Number: [id number] Patient Account Number: [id number] Date of Birth/Sex: Treating RN: [birth year] (50 y.o. Sarah Long, Sarah Long Primary Care Provider: Asencion Noble Other Clinician: Referring Provider: Treating Provider/Extender: Emelda Brothers in Treatment: 11 Debridement Performed for Assessment: Wound #3 Right T Great oe Performed By: Physician [name], PA Debridement Type: Debridement Severity of Tissue Pre Debridement: Bone involvement without necrosis Level of Consciousness (Pre-procedure): Awake and Alert Pre-procedure Verification/Time Out Yes - 11:45 Taken: Start Time: 11:48 Pain Control: Lidocaine 5% topical ointment T Area Debrided (L x W): otal 2.3 (cm) x 1.5 (cm) = 3.45 (cm) Tissue and other material debrided: Viable, Non-Viable, Slough, Subcutaneous, Slough Level: Skin/Subcutaneous Tissue Debridement Description: Excisional Instrument: Curette Bleeding: Minimum Hemostasis Achieved: Pressure End Time: 11:57 Procedural Pain: 0 Post Procedural Pain: 0 Response to Treatment: Procedure was tolerated  well Level of Consciousness (Post- Awake and Alert procedure): Post Debridement Measurements of Total Wound Length: (cm) 2.3 Width: (cm) 1.5 Depth: (cm) 0.7 Volume: (cm) 1.897 Character of Wound/Ulcer Post Debridement: Improved Severity of Tissue Post Debridement: Bone involvement without necrosis Post Procedure Diagnosis Same as Pre-procedure Electronic Signature(s) Signed: 08/12/2021 6:00:14 PM By: [name] PA-C Signed: 08/12/2021 6:09:03 PM By: Baruch Gouty RN, BSN Entered By: Baruch Gouty on 08/12/2021 11:56:09 -------------------------------------------------------------------------------- HPI Details Patient Name: Date of Service: Sarah Long Sarah R. 08/12/2021 10:45 A M Medical Record Number: [id number] Patient Account Number: [id number] Date of Birth/Sex: Treating RN: [birth year] (50 y.o. [name] Primary Care Provider: Asencion Noble Other Clinician: Referring Provider: Treating Provider/Extender: Emelda Brothers in Treatment: 11 History of Present Illness HPI Description: 05/27/2021 upon evaluation today patient appears to be doing poorly in regard to her toes on the left and right foot. She has the first and second toe on the left foot as well as the fifth toe. On the right foot is just the first and second toe. With that being said she does not know any injury or otherwise that occurred. She states she just looked down and noticed that it was there and again that was just within the last week or so around 13 June. She does not know of anything that happened she has not gone barefoot she tells me that she wear shoes and again has not been outside barefoot at any point in time. Fortunately there does not appear to be any signs of active infection at this time. No fevers, chills, nausea, vomiting, or diarrhea. With that being said she is on Keflex she was also on doxycycline previous. She does have a strong history of diabetes mellitus type  2 which is fairly uncontrolled she has not had her insulin for quite a while here it seems. I do not have the final A1c yet although her glucose in the hospital last week was 333. Nonetheless I do believe that this  coupled with her smoking is going to limit her healing to some degree nonetheless we need to get into trying to get her into a better place with regard to this. Her diabetes again is a big deal as far as healing is concerned right now in my opinion being that it still out of control. She also has a cardiac pacemaker she does not really know exactly what happened she tells me she went into DKA was in the hospital and then subsequently had a pacemaker following. 06/03/2021 upon evaluation today patient appears to be doing well with regard to her wounds on the toes. In general I think that she is doing excellent in my opinion. I do not see any signs of active infection which is great news. No fevers, chills, nausea, vomiting, or diarrhea. Unfortunately the alginate has been sticking to the wound beds. 06/10/2021 upon evaluation today patient appears to be doing well with regard to her wounds currently. Fortunately there does not appear to be any signs of infection. Still without making a tremendous amount of improvement but also things are getting significantly worse either which is also good news. Fortunately there is no evidence of infection at this point. The patient did have her appointment with Dr. Sophronia Simas. He has recommended an arteriogram. She will also be having a vascular ultrasound for blood flow coming up in the next few days. 08/12/2021 upon evaluation today patient appears to be doing poorly in regard to her right great toe. This is daily wound left at this point her second toe is healed from last time I saw her and the left leg wound is actually gone as a result of the patient having what was called necrotizing fasciitis and having a below-knee amputation. In regards to the right great  toe she did have an MRI this MRI did show that she had evidence of edema with low T1 signal of the distal phalanx and partially within the proximal phalanx with adjacent soft tissue ulcer. This was compatible with early osteomyelitis or reactive marrow change. Based on what I am seeing I think the probability of early osteomyelitis is most likely. Fortunately there does not appear to be any signs of systemic infection which is great news. With all that being said I think the patient would benefit from the possibility of proceeding with HBO therapy. Electronic Signature(s) Signed: 08/12/2021 1:10:56 PM By: [name] PA-C Entered By: [name] on 08/12/2021 13:10:55 -------------------------------------------------------------------------------- Paring/cutting 1 benign hyperkeratotic lesion Details Patient Name: Date of Service: Sarah, Long 08/12/2021 10:45 A M Medical Record Number: [id number] Patient Account Number: [id number] Date of Birth/Sex: Treating RN: 03-31-1971 (50 y.o. [name] Primary Care Provider: Asencion Noble Other Clinician: Referring Provider: Treating Provider/Extender: Emelda Brothers in Treatment: 11 Procedure Performed for: Non-Wound Location Performed By: Physician [name], PA Post Procedure Diagnosis Same as Pre-procedure Notes right 1st met head callous using #3 curette Electronic Signature(s) Signed: 08/12/2021 6:00:14 PM By: [name] PA-C Signed: 08/12/2021 6:09:03 PM By: Baruch Gouty RN, BSN Entered By: Baruch Gouty on 08/12/2021 11:47:45 -------------------------------------------------------------------------------- Physical Exam Details Patient Name: Date of Service: Sarah Haring R. 08/12/2021 10:45 A M Medical Record Number: [id number] Patient Account Number: [id number] Date of Birth/Sex: Treating RN: 05-Jun-1971 (50 y.o. [name] Primary Care Provider: Asencion Noble Other  Clinician: Referring Provider: Treating Provider/Extender: Emelda Brothers in Treatment: 65 Constitutional Well-nourished and well-hydrated in no acute distress. Respiratory  normal breathing without difficulty. Psychiatric this patient is able to make decisions and demonstrates good insight into disease process. Alert and Oriented x 3. pleasant and cooperative. Notes Upon inspection patient's wound bed actually showed signs of good granulation epithelization at this point. Fortunately there does not appear to be any signs of active infection which is great news and overall I am extremely pleased with where things stand. Electronic Signature(s) Signed: 08/12/2021 1:11:15 PM By: [name] PA-C Entered By: [name] on 08/12/2021 13:11:15 -------------------------------------------------------------------------------- Physician Orders Details Patient Name: Date of Service: Sarah Long Sarah R. 08/12/2021 10:45 A M Medical Record Number: [id number] Patient Account Number: [id number] Date of Birth/Sex: Treating RN: 1970/12/08 (50 y.o. [name] Primary Care Provider: Asencion Noble Other Clinician: Referring Provider: Treating Provider/Extender: Emelda Brothers in Treatment: 11 Verbal / Phone Orders: No Diagnosis Coding ICD-10 Coding Code Description E11.621 Type 2 diabetes mellitus with foot ulcer L97.522 Non-pressure chronic ulcer of other part of left foot with fat layer exposed L97.512 Non-pressure chronic ulcer of other part of right foot with fat layer exposed E11.43 Type 2 diabetes mellitus with diabetic autonomic (poly)neuropathy I10 Essential (primary) hypertension Z95.0 Presence of cardiac pacemaker Follow-up Appointments ppointment in 1 week. - with Margarita Grizzle Return A Bathing/ Shower/ Hygiene May shower with protection but do not get wound dressing(s) wet. Edema Control - Lymphedema / SCD / Other Elevate legs to the  level of the heart or above for 30 minutes daily and/or when sitting, a frequency of: - throughout the day Avoid standing for long periods of time. Additional Orders / Instructions Stop/Decrease Smoking Hyperbaric Oxygen Therapy Indication: - wagner grade 3 diabetic foot ulcer of right great toe If appropriate for treatment, begin HBOT per protocol: 2.0 ATA for 90 Minutes without A Breaks ir Total Number of Treatments: - 40 One treatments per day (delivered Monday through Friday unless otherwise specified in Special Instructions below): Finger stick Blood Glucose Pre- and Post- HBOT Treatment. Follow Hyperbaric Oxygen Glycemia Protocol A frin (Oxymetazoline HCL) 0.05% nasal spray - 1 spray in both nostrils daily as needed prior to HBO treatment for difficulty clearing ears Wound Treatment Wound #3 - T Great oe Wound Laterality: Right Prim Dressing: Hydrofera Blue Ready Foam, 4x5 in Every Other Day/15 Days ary Discharge Instructions: Apply to wound bed cut to fit inside wound edges Secondary Dressing: Woven Gauze Sponges 2x2 in Every Other Day/15 Days Discharge Instructions: Apply over primary dressing as directed. Secured With: Child psychotherapist, Sterile 2x75 (in/in) Every Other Day/15 Days Discharge Instructions: Secure with stretch gauze . wrap toes individually GLYCEMIA INTERVENTIONS PROTOCOL PRE-HBO GLYCEMIA INTERVENTIONS ACTION INTERVENTION Obtain pre-HBO capillary blood glucose (ensure 1 physician order is in chart). A. Notify HBO physician and await physician orders. 2 If result is 70 mg/dl or below: B. If the result meets the hospital definition of a critical result, follow hospital policy. A. Give patient an 8 ounce Glucerna Shake, an 8 ounce Ensure, or 8 ounces of a Glucerna/Ensure equivalent dietary supplement*. B. Wait 30 minutes. If result is 71 mg/dl to 130 mg/dl: C. Retest patients capillary blood glucose (CBG). D. If result greater than or  equal to 110 mg/dl, proceed with HBO. If result less than 110 mg/dl, notify HBO physician and consider holding HBO. If result is 131 mg/dl to 249 mg/dl: A. Proceed with HBO. A. Notify HBO physician and await physician orders. B. It is recommended to hold HBO and do If result is 250  mg/dl or greater: blood/urine ketone testing. C. If the result meets the hospital definition of a critical result, follow hospital policy. POST-HBO GLYCEMIA INTERVENTIONS ACTION INTERVENTION Obtain post HBO capillary blood glucose (ensure 1 physician order is in chart). A. Notify HBO physician and await physician orders. 2 If result is 70 mg/dl or below: B. If the result meets the hospital definition of a critical result, follow hospital policy. A. Give patient an 8 ounce Glucerna Shake, an 8 ounce Ensure, or 8 ounces of a Glucerna/Ensure equivalent dietary supplement*. B. Wait 15 minutes for symptoms of If result is 71 mg/dl to 100 mg/dl: hypoglycemia (i.e. nervousness, anxiety, sweating, chills, clamminess, irritability, confusion, tachycardia or dizziness). C. If patient asymptomatic, discharge patient. If patient symptomatic, repeat capillary blood glucose (CBG) and notify HBO physician. If result is 101 mg/dl to 249 mg/dl: A. Discharge patient. A. Notify HBO physician and await physician orders. B. It is recommended to do blood/urine ketone If result is 250 mg/dl or greater: testing. C. If the result meets the hospital definition of a critical result, follow hospital policy. *Juice or candies are NOT equivalent products. If patient refuses the Glucerna or Ensure, please consult the hospital dietitian for an appropriate substitute. Electronic Signature(s) Signed: 08/12/2021 6:00:14 PM By: [name] PA-C Signed: 08/12/2021 6:09:03 PM By: Baruch Gouty RN, BSN Entered By: Baruch Gouty on 08/12/2021  12:41:48 -------------------------------------------------------------------------------- Problem List Details Patient Name: Date of Service: Sarah Long Sarah R. 08/12/2021 10:45 A M Medical Record Number: [id number] Patient Account Number: [id number] Date of Birth/Sex: Treating RN: 10-11-1971 (50 y.o. [name] Primary Care Provider: Asencion Noble Other Clinician: Referring Provider: Treating Provider/Extender: Emelda Brothers in Treatment: 11 Active Problems ICD-10 Encounter Code Description Active Date MDM Diagnosis [phone] Other chronic osteomyelitis, right ankle and foot 08/12/2021 No Yes E11.621 Type 2 diabetes mellitus with foot ulcer 05/27/2021 No Yes L97.512 Non-pressure chronic ulcer of other part of right foot with fat layer exposed 05/27/2021 No Yes E11.43 Type 2 diabetes mellitus with diabetic autonomic (poly)neuropathy 05/27/2021 No Yes I10 Essential (primary) hypertension 05/27/2021 No Yes Z95.0 Presence of cardiac pacemaker 05/27/2021 No Yes Inactive Problems Resolved Problems ICD-10 Code Description Active Date Resolved Date L97.522 Non-pressure chronic ulcer of other part of left foot with fat layer exposed 05/27/2021 05/27/2021 Electronic Signature(s) Signed: 08/12/2021 1:13:05 PM By: [name] PA-C Previous Signature: 08/12/2021 11:08:01 AM Version By: [name] PA-C Entered By: [name] on 08/12/2021 13:13:05 -------------------------------------------------------------------------------- Progress Note Details Patient Name: Date of Service: Sarah Haring R. 08/12/2021 10:45 A M Medical Record Number: [id number] Patient Account Number: [id number] Date of Birth/Sex: Treating RN: Dec 05, 1971 (50 y.o. [name] Primary Care Provider: Asencion Noble Other Clinician: Referring Provider: Treating Provider/Extender: Emelda Brothers in Treatment: 11 Subjective Chief Complaint Information obtained  from Patient Bilateral toe ulcers History of Present Illness (HPI) 05/27/2021 upon evaluation today patient appears to be doing poorly in regard to her toes on the left and right foot. She has the first and second toe on the left foot as well as the fifth toe. On the right foot is just the first and second toe. With that being said she does not know any injury or otherwise that occurred. She states she just looked down and noticed that it was there and again that was just within the last week or so around 13 June. She does not know of anything that happened she has not gone barefoot  she tells me that she wear shoes and again has not been outside barefoot at any point in time. Fortunately there does not appear to be any signs of active infection at this time. No fevers, chills, nausea, vomiting, or diarrhea. With that being said she is on Keflex she was also on doxycycline previous. She does have a strong history of diabetes mellitus type 2 which is fairly uncontrolled she has not had her insulin for quite a while here it seems. I do not have the final A1c yet although her glucose in the hospital last week was 333. Nonetheless I do believe that this coupled with her smoking is going to limit her healing to some degree nonetheless we need to get into trying to get her into a better place with regard to this. Her diabetes again is a big deal as far as healing is concerned right now in my opinion being that it still out of control. She also has a cardiac pacemaker she does not really know exactly what happened she tells me she went into DKA was in the hospital and then subsequently had a pacemaker following. 06/03/2021 upon evaluation today patient appears to be doing well with regard to her wounds on the toes. In general I think that she is doing excellent in my opinion. I do not see any signs of active infection which is great news. No fevers, chills, nausea, vomiting, or diarrhea. Unfortunately the  alginate has been sticking to the wound beds. 06/10/2021 upon evaluation today patient appears to be doing well with regard to her wounds currently. Fortunately there does not appear to be any signs of infection. Still without making a tremendous amount of improvement but also things are getting significantly worse either which is also good news. Fortunately there is no evidence of infection at this point. The patient did have her appointment with Dr. Sophronia Simas. He has recommended an arteriogram. She will also be having a vascular ultrasound for blood flow coming up in the next few days. 08/12/2021 upon evaluation today patient appears to be doing poorly in regard to her right great toe. This is daily wound left at this point her second toe is healed from last time I saw her and the left leg wound is actually gone as a result of the patient having what was called necrotizing fasciitis and having a below-knee amputation. In regards to the right great toe she did have an MRI this MRI did show that she had evidence of edema with low T1 signal of the distal phalanx and partially within the proximal phalanx with adjacent soft tissue ulcer. This was compatible with early osteomyelitis or reactive marrow change. Based on what I am seeing I think the probability of early osteomyelitis is most likely. Fortunately there does not appear to be any signs of systemic infection which is great news. With all that being said I think the patient would benefit from the possibility of proceeding with HBO therapy. Objective Constitutional Well-nourished and well-hydrated in no acute distress. Vitals Time Taken: 11:03 AM, Height: 64 in, Weight: 200 lbs, BMI: 34.3, Temperature: 98.0 F, Pulse: 87 bpm, Respiratory Rate: 18 breaths/min, Blood Pressure: 134/85 mmHg. Respiratory normal breathing without difficulty. Psychiatric this patient is able to make decisions and demonstrates good insight into disease process. Alert and  Oriented x 3. pleasant and cooperative. General Notes: Upon inspection patient's wound bed actually showed signs of good granulation epithelization at this point. Fortunately there does not appear to be any signs  of active infection which is great news and overall I am extremely pleased with where things stand. Integumentary (Hair, Skin) Wound #3 status is Open. Original cause of wound was Other Lesion. The date acquired was: 05/18/2021. The wound has been in treatment 11 weeks. The wound is located on the Right T Great. The wound measures 2.3cm length x 1.5cm width x 0.7cm depth; 2.71cm^2 area and 1.897cm^3 volume. There is Fat Layer oe (Subcutaneous Tissue) exposed. There is no tunneling or undermining noted. There is a medium amount of purulent drainage noted. The wound margin is distinct with the outline attached to the wound base. There is no granulation within the wound bed. There is a large (67-100%) amount of necrotic tissue within the wound bed including Eschar. Wound #4 status is Healed - Epithelialized. Original cause of wound was Other Lesion. The date acquired was: 05/18/2021. The wound has been in treatment 11 weeks. The wound is located on the Right T Second. The wound measures 0cm length x 0cm width x 0cm depth; 0cm^2 area and 0cm^3 volume. There is no oe tunneling or undermining noted. There is a none present amount of drainage noted. There is no granulation within the wound bed. There is no necrotic tissue within the wound bed. Assessment Active Problems ICD-10 Other chronic osteomyelitis, right ankle and foot Type 2 diabetes mellitus with foot ulcer Non-pressure chronic ulcer of other part of right foot with fat layer exposed Type 2 diabetes mellitus with diabetic autonomic (poly)neuropathy Essential (primary) hypertension Presence of cardiac pacemaker Procedures Wound #3 Pre-procedure diagnosis of Wound #3 is a Diabetic Wound/Ulcer of the Lower Extremity located on the  Right T Great .Severity of Tissue Pre Debridement is: oe Bone involvement without necrosis. There was a Excisional Skin/Subcutaneous Tissue Debridement with a total area of 3.45 sq cm performed by [name], PA. With the following instrument(s): Curette to remove Viable and Non-Viable tissue/material. Material removed includes Subcutaneous Tissue and Slough and after achieving pain control using Lidocaine 5% topical ointment. No specimens were taken. A time out was conducted at 11:45, prior to the start of the procedure. A Minimum amount of bleeding was controlled with Pressure. The procedure was tolerated well with a pain level of 0 throughout and a pain level of 0 following the procedure. Post Debridement Measurements: 2.3cm length x 1.5cm width x 0.7cm depth; 1.897cm^3 volume. Character of Wound/Ulcer Post Debridement is improved. Severity of Tissue Post Debridement is: Bone involvement without necrosis. Post procedure Diagnosis Wound #3: Same as Pre-Procedure A Paring/cutting 1 benign hyperkeratotic lesion procedure was performed. by [name], PA. Post procedure Diagnosis Wound #: Same as Pre-Procedure Notes: right 1st met head callous using #3 curette Plan Follow-up Appointments: Return Appointment in 1 week. - with Glynn Octave Shower/ Hygiene: May shower with protection but do not get wound dressing(s) wet. Edema Control - Lymphedema / SCD / Other: Elevate legs to the level of the heart or above for 30 minutes daily and/or when sitting, a frequency of: - throughout the day Avoid standing for long periods of time. Additional Orders / Instructions: Stop/Decrease Smoking Hyperbaric Oxygen Therapy: Indication: - wagner grade 3 diabetic foot ulcer of right great toe If appropriate for treatment, begin HBOT per protocol: 2.0 ATA for 90 Minutes without Air Breaks T Number of Treatments: - 40 otal One treatments per day (delivered Monday through Friday unless otherwise  specified in Special Instructions below): Finger stick Blood Glucose Pre- and Post- HBOT Treatment. Follow Hyperbaric Oxygen Glycemia Protocol  Afrin (Oxymetazoline HCL) 0.05% nasal spray - 1 spray in both nostrils daily as needed prior to HBO treatment for difficulty clearing ears WOUND #3: - T Great Wound Laterality: Right oe Prim Dressing: Hydrofera Blue Ready Foam, 4x5 in Every Other Day/15 Days ary Discharge Instructions: Apply to wound bed cut to fit inside wound edges Secondary Dressing: Woven Gauze Sponges 2x2 in Every Other Day/15 Days Discharge Instructions: Apply over primary dressing as directed. Secured With: Child psychotherapist, Sterile 2x75 (in/in) Every Other Day/15 Days Discharge Instructions: Secure with stretch gauze . wrap toes individually 1. Would recommend currently that we going continue with the wound care measures as before and the patient is in agreement the plan. This includes the use of the Hydrofera Blue to the toe I think this is can be our best option here. 2. I will recommend that we have the patient continue with the dressing changes every other day to keep this clean. 3. I am also going to recommend that we going to proceed with hyperbaric oxygen therapy I think that this would be ideal for the patient to try to get the osteomyelitis cleared before this gets any worse and try to prevent any further amputation. We will see patient back for reevaluation in 1 week here in the clinic. If anything worsens or changes patient will contact our office for additional recommendations. Electronic Signature(s) Signed: 08/12/2021 1:13:34 PM By: [name] PA-C Previous Signature: 08/12/2021 1:12:03 PM Version By: [name] PA-C Entered By: [name] on 08/12/2021 13:13:34 -------------------------------------------------------------------------------- SuperBill Details Patient Name: Date of Service: Sarah Haring R. 08/12/2021 Medical Record  Number: [id number] Patient Account Number: [id number] Date of Birth/Sex: Treating RN: Jun 26, 1971 (50 y.o. Sarah Long, Sarah Long Primary Care Provider: Asencion Noble Other Clinician: Referring Provider: Treating Provider/Extender: Emelda Brothers in Treatment: 11 Diagnosis Coding ICD-10 Codes Code Description [phone] Other chronic osteomyelitis, right ankle and foot E11.621 Type 2 diabetes mellitus with foot ulcer L97.512 Non-pressure chronic ulcer of other part of right foot with fat layer exposed E11.43 Type 2 diabetes mellitus with diabetic autonomic (poly)neuropathy I10 Essential (primary) hypertension Z95.0 Presence of cardiac pacemaker Facility Procedures CPT4 Code: [id number] Description: [place] - DEB SUBQ TISSUE 20 SQ CM/< ICD-10 Diagnosis Description L97.512 Non-pressure chronic ulcer of other part of right foot with fat layer exposed Modifier: Quantity: 1 Physician Procedures : CPT4 Code Description Modifier [id number] - WC PHYS LEVEL 4 - EST PT 25 ICD-10 Diagnosis Description M86.671 Other chronic osteomyelitis, right ankle and foot E11.621 Type 2 diabetes mellitus with foot ulcer L97.512 Non-pressure chronic ulcer of  other part of right foot with fat layer exposed E11.43 Type 2 diabetes mellitus with diabetic autonomic (poly)neuropathy Quantity: 1 : [id number] 11042 - WC PHYS SUBQ TISS 20 SQ CM ICD-10 Diagnosis Description L97.512 Non-pressure chronic ulcer of other part of right foot with fat layer exposed Quantity: 1 Electronic Signature(s) Signed: 08/12/2021 1:14:08 PM By: [name] PA-C Previous Signature: 08/12/2021 1:12:18 PM Version By: [name] PA-C Entered By: [name] on 08/12/2021 13:14:08

## 2021-08-12 NOTE — Progress Notes (Signed)
CUMI, HAGEMANN ([id number]) Visit Report for 08/12/2021 Arrival Information Details Patient Name: Date of Service: Sarah Long, Sarah Long 08/12/2021 10:45 A M Medical Record Number: [id number] Patient Account Number: [id number] Date of Birth/Sex: Treating RN: January 27, 1971 (50 y.o. Helene Shoe, Tammi Klippel Primary Care Liban Guedes: Asencion Noble Other Clinician: Referring Soyla Bainter: Treating Kassius Battiste/Extender: Emelda Brothers in Treatment: 11 Visit Information History Since Last Visit Added or deleted any medications: No Patient Arrived: Wheel Chair Any new allergies or adverse reactions: No Arrival Time: 10:56 Had a fall or experienced change in No Accompanied By: self activities of daily living that may affect Transfer Assistance: Manual risk of falls: Patient Identification Verified: Yes Signs or symptoms of abuse/neglect since last visito No Secondary Verification Process Completed: Yes Hospitalized since last visit: No Patient Requires Transmission-Based Precautions: No Implantable device outside of the clinic excluding No Patient Has Alerts: Yes cellular tissue based products placed in the center Patient Alerts: Pacemaker since last visit: Bilat ABI= Has Dressing in Place as Prescribed: Yes Pain Present Now: Yes Electronic Signature(s) Signed: 08/12/2021 5:29:03 PM By: Deon Pilling Entered By: Deon Pilling on 08/12/2021 11:03:29 -------------------------------------------------------------------------------- Encounter Discharge Information Details Patient Name: Date of Service: Sarah Haring R. 08/12/2021 10:45 A M Medical Record Number: [id number] Patient Account Number: [id number] Date of Birth/Sex: Treating RN: 1971-04-21 (50 y.o. [name] Primary Care Elizabethann Lackey: Asencion Noble Other Clinician: Referring Markell Schrier: Treating Rahul Malinak/Extender: Emelda Brothers in Treatment: 11 Encounter Discharge Information Items Post Procedure  Vitals Discharge Condition: Stable Temperature (F): 98 Ambulatory Status: Wheelchair Pulse (bpm): 87 Discharge Destination: Home Respiratory Rate (breaths/min): 18 Transportation: Other Blood Pressure (mmHg): 134/85 Accompanied By: self Schedule Follow-up Appointment: Yes Clinical Summary of Care: Patient Declined Notes transportation service Electronic Signature(s) Signed: 08/12/2021 6:09:03 PM By: Baruch Gouty RN, BSN Entered By: Baruch Gouty on 08/12/2021 12:45:59 -------------------------------------------------------------------------------- Lower Extremity Assessment Details Patient Name: Date of Service: Sarah Haring R. 08/12/2021 10:45 A M Medical Record Number: [id number] Patient Account Number: [id number] Date of Birth/Sex: Treating RN: 1971/06/25 (50 y.o. [name] Primary Care Obrian Bulson: Asencion Noble Other Clinician: Referring Tyquez Hollibaugh: Treating Torina Ey/Extender: Emelda Brothers in Treatment: 11 Edema Assessment Assessed: Shirlyn Goltz: No] Patrice Paradise: No] Edema: [Left: N] [Right: o] Calf Left: Right: Point of Measurement: 30 cm From Medial Instep 37.5 cm Ankle Left: Right: Point of Measurement: 9 cm From Medial Instep 23.2 cm Vascular Assessment Pulses: Dorsalis Pedis Palpable: [Right:Yes] Electronic Signature(s) Signed: 08/12/2021 6:09:03 PM By: Baruch Gouty RN, BSN Entered By: Baruch Gouty on 08/12/2021 11:26:17 -------------------------------------------------------------------------------- [place] Details Patient Name: Date of Service: Sarah Ferrari EL R. 08/12/2021 10:45 A M Medical Record Number: [id number] Patient Account Number: [id number] Date of Birth/Sex: Treating RN: December 26, 1970 (50 y.o. [name] Primary Care Yamili Lichtenwalner: Asencion Noble Other Clinician: Referring Rivka Baune: Treating Neyah Ellerman/Extender: Emelda Brothers in Treatment: [place]  reviewed with physician Active Inactive Nutrition Nursing Diagnoses: Impaired glucose control: actual or potential Potential for alteratiion in Nutrition/Potential for imbalanced nutrition Goals: Patient/caregiver verbalizes understanding of need to maintain therapeutic glucose control per primary care physician Date Initiated: 05/27/2021 Target Resolution Date: 09/09/2021 Goal Status: Active Patient/caregiver will maintain therapeutic glucose control Date Initiated: 05/27/2021 Target Resolution Date: 09/09/2021 Goal Status: Active Interventions: Assess patient nutrition upon admission and as needed per policy Provide education on nutrition Treatment Activities: Patient referred to Primary Care Physician for further nutritional evaluation : 05/27/2021 Notes: Wound/Skin Impairment Nursing Diagnoses: Impaired tissue  integrity Knowledge deficit related to smoking impact on wound healing Knowledge deficit related to ulceration/compromised skin integrity Goals: Patient will demonstrate a reduced rate of smoking or cessation of smoking Date Initiated: 05/27/2021 Target Resolution Date: 09/09/2021 Goal Status: Active Patient/caregiver will verbalize understanding of skin care regimen Date Initiated: 05/27/2021 Target Resolution Date: 09/09/2021 Goal Status: Active Ulcer/skin breakdown will have a volume reduction of 30% by week 4 Date Initiated: 05/27/2021 Date Inactivated: 08/12/2021 Target Resolution Date: 06/24/2021 Goal Status: Unmet Unmet Reason: required left BKA Interventions: Assess patient/caregiver ability to obtain necessary supplies Assess patient/caregiver ability to perform ulcer/skin care regimen upon admission and as needed Assess ulceration(s) every visit Provide education on smoking Provide education on ulcer and skin care Treatment Activities: Skin care regimen initiated : 05/27/2021 Topical wound management initiated : 05/27/2021 Notes: Electronic  Signature(s) Signed: 08/12/2021 6:09:03 PM By: Baruch Gouty RN, BSN Entered By: Baruch Gouty on 08/12/2021 12:43:44 -------------------------------------------------------------------------------- Pain Assessment Details Patient Name: Date of Service: Sarah Haring R. 08/12/2021 10:45 A M Medical Record Number: [id number] Patient Account Number: [id number] Date of Birth/Sex: Treating RN: [birth year] (50 y.o. Debby Bud Primary Care Pearley Baranek: Asencion Noble Other Clinician: Referring Chrissie Dacquisto: Treating Jaymason Ledesma/Extender: Emelda Brothers in Treatment: 11 Active Problems Location of Pain Severity and Description of Pain Patient Has Paino Yes Site Locations Rate the pain. Rate the pain. Current Pain Level: 4 Pain Management and Medication Current Pain Management: Electronic Signature(s) Signed: 08/12/2021 5:29:03 PM By: Deon Pilling Entered By: Deon Pilling on 08/12/2021 11:03:54 -------------------------------------------------------------------------------- Patient/Caregiver Education Details Patient Name: Date of Service: Orland Mustard 9/7/2022andnbsp10:45 [place] Record Number: [id number] Patient Account Number: [id number] Date of Birth/Gender: Treating RN: Oct 09, 1971 (50 y.o. [name] Primary Care Physician: Asencion Noble Other Clinician: Referring Physician: Treating Physician/Extender: Emelda Brothers in Treatment: 11 Education Assessment Education Provided To: Patient Education Topics Provided Elevated Blood Sugar/ Impact on Healing: Methods: Explain/Verbal Responses: Reinforcements needed, State content correctly Smoking and Wound Healing: Methods: Explain/Verbal Responses: Reinforcements needed, State content correctly Wound/Skin Impairment: Methods: Explain/Verbal Responses: Reinforcements needed, State content correctly Electronic Signature(s) Signed: 08/12/2021 6:09:03 PM By: Baruch Gouty RN, BSN Entered By: Baruch Gouty on 08/12/2021 12:44:15 -------------------------------------------------------------------------------- Wound Assessment Details Patient Name: Date of Service: Sarah Haring R. 08/12/2021 10:45 A M Medical Record Number: [id number] Patient Account Number: [id number] Date of Birth/Sex: Treating RN: 14-Jul-1971 (50 y.o. [name], [name] Primary Care Kenry Daubert: Asencion Noble Other Clinician: Referring Aniesa Boback: Treating Larita Deremer/Extender: Emelda Brothers in Treatment: 11 Wound Status Wound Number: 3 Primary Etiology: Diabetic Wound/Ulcer of the Lower Extremity Wound Location: Right T Great oe Wound Status: Open Wounding Event: Other Lesion Comorbid History: Glaucoma, Hypertension, Type II Diabetes, Neuropathy Date Acquired: 05/18/2021 Weeks Of Treatment: 11 Clustered Wound: No Photos Wound Measurements Length: (cm) 2.3 Width: (cm) 1.5 Depth: (cm) 0.7 Area: (cm) 2.71 Volume: (cm) 1.897 % Reduction in Area: 84.3% % Reduction in Volume: 45.1% Epithelialization: Small (1-33%) Tunneling: No Undermining: No Wound Description Classification: Grade 3 [place] Verification: MRI Wound Margin: Distinct, outline attached Exudate Amount: Medium Exudate Type: Purulent Exudate Color: yellow, brown, green Foul Odor After Cleansing: No Slough/Fibrino Yes Wound Bed Granulation Amount: None Present (0%) Exposed Structure Necrotic Amount: Large (67-100%) Fascia Exposed: No Necrotic Quality: Eschar Fat Layer (Subcutaneous Tissue) Exposed: Yes Tendon Exposed: No Muscle Exposed: No Joint Exposed: No Bone Exposed: No Treatment Notes Wound #3 (Toe Great) Wound Laterality: Right Cleanser Peri-Wound Care Topical Primary Dressing  Hydrofera Blue Ready Foam, 4x5 in Discharge Instruction: Apply to wound bed cut to fit inside wound edges Secondary Dressing Woven Gauze Sponges 2x2 in Discharge Instruction: Apply over primary  dressing as directed. Secured With Conforming Stretch Gauze Bandage, Sterile 2x75 (in/in) Discharge Instruction: Secure with stretch gauze . wrap toes individually Compression Wrap Compression Stockings Add-Ons Electronic Signature(s) Signed: 08/12/2021 6:09:03 PM By: Baruch Gouty RN, BSN Entered By: Baruch Gouty on 08/12/2021 11:51:55 -------------------------------------------------------------------------------- Wound Assessment Details Patient Name: Date of Service: Sarah Haring R. 08/12/2021 10:45 A M Medical Record Number: [id number] Patient Account Number: [id number] Date of Birth/Sex: Treating RN: [birth year] (50 y.o. [name], [name] Primary Care Junious Ragone: Asencion Noble Other Clinician: Referring [name] Miranda: Treating Pamala Hayman/Extender: Emelda Brothers in Treatment: 11 Wound Status Wound Number: 4 Primary Etiology: Diabetic Wound/Ulcer of the Lower Extremity Wound Location: Right T Second oe Wound Status: Healed - Epithelialized Wounding Event: Other Lesion Comorbid History: Glaucoma, Hypertension, Type II Diabetes, Neuropathy Date Acquired: 05/18/2021 Weeks Of Treatment: 11 Clustered Wound: No Photos Wound Measurements Length: (cm) Width: (cm) Depth: (cm) Area: (cm) Volume: (cm) 0 % Reduction in Area: 100% 0 % Reduction in Volume: 100% 0 Epithelialization: Large (67-100%) 0 Tunneling: No 0 Undermining: No Wound Description Classification: Grade 1 Exudate Amount: None Present Foul Odor After Cleansing: No Slough/Fibrino No Wound Bed Granulation Amount: None Present (0%) Exposed Structure Necrotic Amount: None Present (0%) Fascia Exposed: No Fat Layer (Subcutaneous Tissue) Exposed: No Tendon Exposed: No Muscle Exposed: No Joint Exposed: No Bone Exposed: No Electronic Signature(s) Signed: 08/12/2021 5:29:03 PM By: Deon Pilling Signed: 08/12/2021 6:09:03 PM By: Baruch Gouty RN, BSN Entered By: Deon Pilling on 08/12/2021  11:34:49 -------------------------------------------------------------------------------- [place] Details Patient Name: Date of Service: Sarah Ferrari EL R. 08/12/2021 10:45 A M Medical Record Number: [id number] Patient Account Number: [id number] Date of Birth/Sex: Treating RN: [birth year] (50 y.o. Helene Shoe, Tammi Klippel Primary Care Rayshawn Visconti: Asencion Noble Other Clinician: Referring Aly Seidenberg: Treating Honi Name/Extender: Emelda Brothers in Treatment: 11 Vital Signs Time Taken: 11:03 Temperature (F): 98.0 Height (in): 64 Pulse (bpm): 87 Weight (lbs): 200 Respiratory Rate (breaths/min): 18 Body Mass Index (BMI): 34.3 Blood Pressure (mmHg): 134/85 Reference Range: 80 - 120 mg / dl Electronic Signature(s) Signed: 08/12/2021 5:29:03 PM By: Deon Pilling Entered By: Deon Pilling on 08/12/2021 11:03:45

## 2021-08-12 NOTE — Telephone Encounter (Signed)
Ms Sarah Long called requesting Dr Ranell Patrick send in an Rx for Diflucan.

## 2021-08-13 ENCOUNTER — Telehealth (INDEPENDENT_AMBULATORY_CARE_PROVIDER_SITE_OTHER): Payer: Self-pay | Admitting: Critical Care Medicine

## 2021-08-13 NOTE — Telephone Encounter (Signed)
Copied from [place] [id number]. Topic: General - Other >> Aug 12, 2021  9:06 AM Leward Quan A wrote: Reason for CRM: Patient called in asking for a call back from the social worker or her assistant. Did not say why but can be reached at Ph# [phone]

## 2021-08-13 NOTE — Progress Notes (Signed)
Remote pacemaker transmission.   

## 2021-08-14 ENCOUNTER — Other Ambulatory Visit: Payer: Self-pay

## 2021-08-14 NOTE — Telephone Encounter (Signed)
I spoke with this pt and she mentioned that she was in assistance of getting a diabetic shoe. Is this something you can assist with Sarah Long?  Sarah Long- she mentioned that she was referred for food in the past but never heard anything back. Was this something you helped with? She also needs to be referred to [facility]. She says she has not had disability in two years and needs to reapply. I can do this, I'm just unsure of the referral process there.

## 2021-08-17 ENCOUNTER — Telehealth: Payer: Self-pay

## 2021-08-17 ENCOUNTER — Other Ambulatory Visit: Payer: Self-pay

## 2021-08-17 ENCOUNTER — Encounter (HOSPITAL_BASED_OUTPATIENT_CLINIC_OR_DEPARTMENT_OTHER): Payer: Medicaid Other | Admitting: Internal Medicine

## 2021-08-17 DIAGNOSIS — E11621 Type 2 diabetes mellitus with foot ulcer: Secondary | ICD-10-CM | POA: Diagnosis not present

## 2021-08-17 NOTE — Progress Notes (Signed)
Sarah Long, Sarah Long ([id number]) Visit Report for 08/17/2021 Arrival Information Details Patient Name: Date of Service: Sarah Long, Sarah Long 08/17/2021 3:30 PM Medical Record Number: [id number] Patient Account Number: [id number] Date of Birth/Sex: Treating RN: [birth year] (50 y.o. Sarah Long Primary Care Juancarlos Long: Sarah Long Other Clinician: Referring Sarah Long: Treating Sarah Long/Extender: Sarah Long in Treatment: 11 Visit Information History Since Last Visit Added or deleted any medications: No Patient Arrived: Wheel Chair Any new allergies or adverse reactions: No Arrival Time: 15:32 Had a fall or experienced change in No Accompanied By: daughter activities of daily living that may affect Transfer Assistance: None risk of falls: Patient Identification Verified: Yes Signs or symptoms of abuse/neglect since last visito No Secondary Verification Process Completed: Yes Hospitalized since last visit: No Patient Requires Transmission-Based Precautions: No Implantable device outside of the clinic excluding No Patient Has Alerts: Yes cellular tissue based products placed in the center Patient Alerts: Pacemaker since last visit: Bilat ABI=[place] Has Dressing in Place as Prescribed: Yes Pain Present Now: No Electronic Signature(s) Signed: 08/17/2021 4:41:05 PM By: Sarah Long Entered By: Sarah Long on 08/17/2021 15:35:10 -------------------------------------------------------------------------------- Clinic Level of Care Assessment Details Patient Name: Date of Service: Sarah Long, Sarah Long 08/17/2021 3:30 PM Medical Record Number: [id number] Patient Account Number: [id number] Date of Birth/Sex: Treating RN: 1971/08/11 (50 y.o. Sarah Long Primary Care Sarah Long: Sarah Long Other Clinician: Referring Sarah Long: Treating Devean Skoczylas/Extender: Sarah Long in Treatment: 11 Clinic Level of Care Assessment Items TOOL 4 Quantity  Score X- 1 0 Use when only an EandM is performed on FOLLOW-UP visit ASSESSMENTS - Nursing Assessment / Reassessment X- 1 10 Reassessment of Co-morbidities (includes updates in patient status) X- 1 5 Reassessment of Adherence to Treatment Plan ASSESSMENTS - Wound and Skin A ssessment / Reassessment X - Simple Wound Assessment / Reassessment - one wound 1 5 '[]'$  - 0 Complex Wound Assessment / Reassessment - multiple wounds '[]'$  - 0 Dermatologic / Skin Assessment (not related to wound area) ASSESSMENTS - Focused Assessment '[]'$  - 0 Circumferential Edema Measurements - multi extremities '[]'$  - 0 Nutritional Assessment / Counseling / Intervention '[]'$  - 0 Lower Extremity Assessment (monofilament, tuning fork, pulses) '[]'$  - 0 Peripheral Arterial Disease Assessment (using hand held doppler) ASSESSMENTS - Ostomy and/or Continence Assessment and Care '[]'$  - 0 Incontinence Assessment and Management '[]'$  - 0 Ostomy Care Assessment and Management (repouching, etc.) PROCESS - Coordination of Care X - Simple Patient / Family Education for ongoing care 1 15 '[]'$  - 0 Complex (extensive) Patient / Family Education for ongoing care '[]'$  - 0 Staff obtains Programmer, systems, Records, T Results / Process Orders est '[]'$  - 0 Staff telephones HHA, Nursing Homes / Clarify orders / etc '[]'$  - 0 Routine Transfer to another Facility (non-emergent condition) '[]'$  - 0 Routine Hospital Admission (non-emergent condition) '[]'$  - 0 New Admissions / Biomedical engineer / Ordering NPWT Apligraf, etc. , '[]'$  - 0 Emergency Hospital Admission (emergent condition) '[]'$  - 0 Simple Discharge Coordination '[]'$  - 0 Complex (extensive) Discharge Coordination PROCESS - Special Needs '[]'$  - 0 Pediatric / Minor Patient Management '[]'$  - 0 Isolation Patient Management '[]'$  - 0 Hearing / Language / Visual special needs '[]'$  - 0 Assessment of Community assistance (transportation, D/C planning, etc.) '[]'$  - 0 Additional assistance / Altered  mentation '[]'$  - 0 Support Surface(s) Assessment (bed, cushion, seat, etc.) INTERVENTIONS - Wound Cleansing / Measurement X - Simple Wound Cleansing - one wound 1 5 '[]'$  - 0 Complex Wound  Cleansing - multiple wounds '[]'$  - 0 Wound Imaging (photographs - any number of wounds) '[]'$  - 0 Wound Tracing (instead of photographs) X- 1 5 Simple Wound Measurement - one wound '[]'$  - 0 Complex Wound Measurement - multiple wounds INTERVENTIONS - Wound Dressings '[]'$  - 0 Small Wound Dressing one or multiple wounds X- 1 15 Medium Wound Dressing one or multiple wounds '[]'$  - 0 Large Wound Dressing one or multiple wounds '[]'$  - 0 Application of Medications - topical '[]'$  - 0 Application of Medications - injection INTERVENTIONS - Miscellaneous '[]'$  - 0 External ear exam '[]'$  - 0 Specimen Collection (cultures, biopsies, blood, body fluids, etc.) '[]'$  - 0 Specimen(s) / Culture(s) sent or taken to Lab for analysis '[]'$  - 0 Patient Transfer (multiple staff / Civil Service fast streamer / Similar devices) '[]'$  - 0 Simple Staple / Suture removal (25 or less) '[]'$  - 0 Complex Staple / Suture removal (26 or more) '[]'$  - 0 Hypo / Hyperglycemic Management (close monitor of Blood Glucose) '[]'$  - 0 Ankle / Brachial Index (ABI) - do not check if billed separately X- 1 5 Vital Signs Has the patient been seen at the hospital within the last three years: Yes Total Score: 65 Level Of Care: New/Established - Level 2 Electronic Signature(s) Signed: 08/17/2021 4:41:05 PM By: Sarah Long Entered By: Sarah Long on 08/17/2021 15:48:29 -------------------------------------------------------------------------------- Encounter Discharge Information Details Patient Name: Date of Service: Sarah Haring R. 08/17/2021 3:30 PM Medical Record Number: [id number] Patient Account Number: [id number] Date of Birth/Sex: Treating RN: 21-Sep-1971 (50 y.o. Sarah Long Primary Care Sarah Long: Sarah Long Other Clinician: Referring Sarah Long: Treating  Sarah Long/Extender: Sarah Long in Treatment: 11 Encounter Discharge Information Items Discharge Condition: Stable Ambulatory Status: Wheelchair Discharge Destination: Home Transportation: Private Auto Accompanied By: Daughter Schedule Follow-up Appointment: Yes Clinical Summary of Care: Provided on 08/17/2021 Form Type Recipient Paper Patient Patient Electronic Signature(s) Signed: 08/17/2021 4:41:05 PM By: Sarah Long Entered By: Sarah Long on 08/17/2021 15:47:28 -------------------------------------------------------------------------------- Patient/Caregiver Education Details Patient Name: Date of Service: Sarah Long 9/12/2022andnbsp3:30 PM Medical Record Number: [id number] Patient Account Number: [id number] Date of Birth/Gender: Treating RN: [birth year] (50 y.o. Sarah Long Primary Care Physician: Sarah Long Other Clinician: Referring Physician: Treating Physician/Extender: Sarah Long in Treatment: 11 Education Assessment Education Provided To: Patient Education Topics Provided Smoking and Wound Healing: Methods: Explain/Verbal Responses: State content correctly Wound/Skin Impairment: Methods: Explain/Verbal, Printed Responses: State content correctly Electronic Signature(s) Signed: 08/17/2021 4:41:05 PM By: Sarah Long Entered By: Sarah Long on 08/17/2021 15:47:05 -------------------------------------------------------------------------------- Wound Assessment Details Patient Name: Date of Service: Sarah Haring R. 08/17/2021 3:30 PM Medical Record Number: [id number] Patient Account Number: [id number] Date of Birth/Sex: Treating RN: Jul 01, 1971 (50 y.o. Sarah Long Primary Care Journee Kohen: Sarah Long Other Clinician: Referring Ismeal Heider: Treating Adeliz Tonkinson/Extender: Sarah Long in Treatment: 11 Wound Status Wound Number: 3 Primary Etiology:  Diabetic Wound/Ulcer of the Lower Extremity Wound Location: Right T Great oe Wound Status: Open Wounding Event: Other Lesion Comorbid History: Glaucoma, Hypertension, Type II Diabetes, Neuropathy Date Acquired: 05/18/2021 Weeks Of Treatment: 11 Clustered Wound: No Wound Measurements Length: (cm) 2.3 Width: (cm) 1.5 Depth: (cm) 0.7 Area: (cm) 2.71 Volume: (cm) 1.897 % Reduction in Area: 84.3% % Reduction in Volume: 45.1% Epithelialization: Small (1-33%) Tunneling: No Undermining: No Wound Description Classification: Grade 3 Wound Margin: Distinct, outline attached Exudate Amount: Medium Exudate Type: Purulent Exudate Color: yellow, brown, green Foul Odor After Cleansing: No Slough/Fibrino Yes Wound Bed Granulation  Amount: Large (67-100%) Exposed Structure Granulation Quality: Red Fascia Exposed: No Necrotic Amount: Small (1-33%) Fat Layer (Subcutaneous Tissue) Exposed: Yes Necrotic Quality: Adherent Slough Tendon Exposed: No Muscle Exposed: No Joint Exposed: No Bone Exposed: No Treatment Notes Wound #3 (Toe Great) Wound Laterality: Right Cleanser Peri-Wound Care Topical Primary Dressing Hydrofera Blue Ready Foam, 4x5 in Discharge Instruction: Apply to wound bed cut to fit inside wound edges Secondary Dressing Woven Gauze Sponges 2x2 in Discharge Instruction: Apply over primary dressing as directed. Secured With Conforming Stretch Gauze Bandage, Sterile 2x75 (in/in) Discharge Instruction: Secure with stretch gauze . wrap toes individually Compression Wrap Compression Stockings Add-Ons Electronic Signature(s) Signed: 08/17/2021 4:41:05 PM By: Sarah Long Entered By: Sarah Long on 08/17/2021 15:46:03 -------------------------------------------------------------------------------- Vitals Details Patient Name: Date of Service: Sarah Ferrari EL R. 08/17/2021 3:30 PM Medical Record Number: [id number] Patient Account Number: [id number] Date of  Birth/Sex: Treating RN: [birth year] (50 y.o. Sarah Long Primary Care Raquan Iannone: Sarah Long Other Clinician: Referring Lawrence Roldan: Treating Acquanetta Cabanilla/Extender: Sarah Long in Treatment: 11 Vital Signs Time Taken: 15:35 Temperature (F): 98.3 Height (in): 64 Pulse (bpm): 83 Weight (lbs): 200 Respiratory Rate (breaths/min): 18 Body Mass Index (BMI): 34.3 Blood Pressure (mmHg): 144/80 Reference Range: 80 - 120 mg / dl Electronic Signature(s) Signed: 08/17/2021 4:41:05 PM By: Sarah Long Entered By: Sarah Long on 08/17/2021 15:35:39

## 2021-08-17 NOTE — Progress Notes (Signed)
[name], [name] ([id number]) Visit Report for 08/17/2021 SuperBill Details Patient Name: Date of Service: Sarah Long, Sarah Long 08/17/2021 Medical Record Number: [id number] Patient Account Number: [id number] Date of Birth/Sex: Treating RN: 1971/10/08 (50 y.o. [name] Primary Care Provider: Asencion Noble Other Clinician: Referring Provider: Treating Provider/Extender: Madelynn Done in Treatment: 11 Diagnosis Coding ICD-10 Codes Code Description [phone] Other chronic osteomyelitis, right ankle and foot E11.621 Type 2 diabetes mellitus with foot ulcer L97.512 Non-pressure chronic ulcer of other part of right foot with fat layer exposed E11.43 Type 2 diabetes mellitus with diabetic autonomic (poly)neuropathy I10 Essential (primary) hypertension Z95.0 Presence of cardiac pacemaker Facility Procedures CPT4 Code Description Modifier Quantity [id number] - WOUND CARE VISIT-LEV 2 EST PT 1 Electronic Signature(s) Signed: 08/17/2021 4:41:05 PM By: Lorrin Jackson Signed: 08/17/2021 4:47:36 PM By: Kalman Shan DO Entered By: Lorrin Jackson on 08/17/2021 15:48:37

## 2021-08-17 NOTE — Telephone Encounter (Signed)
Call placed to [facility], spoke to [place] who confirmed receipt of the referral and stated that patient's assessment is scheduled for 09/01/2021.

## 2021-08-19 ENCOUNTER — Encounter (HOSPITAL_BASED_OUTPATIENT_CLINIC_OR_DEPARTMENT_OTHER): Payer: Medicaid Other | Admitting: Physician Assistant

## 2021-08-19 ENCOUNTER — Other Ambulatory Visit: Payer: Self-pay

## 2021-08-19 DIAGNOSIS — E11621 Type 2 diabetes mellitus with foot ulcer: Secondary | ICD-10-CM | POA: Diagnosis not present

## 2021-08-19 MED ORDER — LEVOFLOXACIN 750 MG PO TABS
ORAL_TABLET | ORAL | 0 refills | Status: DC
  Filled 2021-08-19: qty 30, 30d supply, fill #0

## 2021-08-19 NOTE — Progress Notes (Addendum)
Sarah Long ([id number]) Visit Report for 08/19/2021 Chief Complaint Document Details Patient Name: Date of Service: Sarah Long. 08/19/2021 1:00 PM Medical Record Number: [id number] Patient Account Number: [id number] Date of Birth/Sex: Treating RN: 1971/04/22 (50 y.o. Sarah Long Primary Care Provider: Asencion Long Other Clinician: Referring Provider: Treating Provider/Extender: Sarah Long in Treatment: 12 Information Obtained from: Patient Chief Complaint Bilateral toe ulcers Electronic Signature(s) Signed: 08/19/2021 1:19:27 PM By: Sarah Keeler PA-C Entered By: Sarah Long on 08/19/2021 13:19:27 -------------------------------------------------------------------------------- Debridement Details Patient Name: Date of Service: Sarah Ferrari EL R. 08/19/2021 1:00 PM Medical Record Number: [id number] Patient Account Number: [id number] Date of Birth/Sex: Treating RN: August 27, 1971 (50 y.o. Sarah Long, Sarah Long Primary Care Provider: Asencion Long Other Clinician: Donavan Long Referring Provider: Treating Provider/Extender: Sarah Long in Treatment: 12 Debridement Performed for Assessment: Wound #3 Right T Great oe Performed By: Physician Sarah Keeler, PA Debridement Type: Debridement Severity of Tissue Pre Debridement: Bone involvement without necrosis Level of Consciousness (Pre-procedure): Awake and Alert Pre-procedure Verification/Time Out Yes - 14:40 Taken: Start Time: 14:41 T Area Debrided (L x W): otal 2.1 (cm) x 1 (cm) = 2.1 (cm) Tissue and other material debrided: Viable, Non-Viable, Callus, Slough, Subcutaneous, Skin: Epidermis, Slough Level: Skin/Subcutaneous Tissue Debridement Description: Excisional Instrument: Curette Bleeding: Minimum Hemostasis Achieved: Pressure End Time: 14:45 Procedural Pain: 0 Post Procedural Pain: 0 Response to Treatment: Procedure was tolerated well Level of  Consciousness (Post- Awake and Alert procedure): Post Debridement Measurements of Total Wound Length: (cm) 2.1 Width: (cm) 1 Depth: (cm) 1.1 Volume: (cm) 1.814 Character of Wound/Ulcer Post Debridement: Improved Severity of Tissue Post Debridement: Bone involvement without necrosis Post Procedure Diagnosis Same as Pre-procedure Electronic Signature(s) Signed: 08/19/2021 5:28:57 PM By: Sarah Keeler PA-C Signed: 08/19/2021 6:35:12 PM By: Sarah Gouty RN, BSN Entered By: Sarah Long on 08/19/2021 14:51:45 -------------------------------------------------------------------------------- HPI Details Patient Name: Date of Service: Sarah Ferrari EL R. 08/19/2021 1:00 PM Medical Record Number: [id number] Patient Account Number: [id number] Date of Birth/Sex: Treating RN: December 11, 1970 (50 y.o. Sarah Long Primary Care Provider: Asencion Long Other Clinician: Referring Provider: Treating Provider/Extender: Sarah Long in Treatment: 12 History of Present Illness HPI Description: 05/27/2021 upon evaluation today patient appears to be doing poorly in regard to her toes on the left and right foot. She has the first and second toe on the left foot as well as the fifth toe. On the right foot is just the first and second toe. With that being said she does not know any injury or otherwise that occurred. She states she just looked down and noticed that it was there and again that was just within the last week or so around 13 June. She does not know of anything that happened she has not gone barefoot she tells me that she wear shoes and again has not been outside barefoot at any point in time. Fortunately there does not appear to be any signs of active infection at this time. No fevers, chills, nausea, vomiting, or diarrhea. With that being said she is on Keflex she was also on doxycycline previous. She does have a strong history of diabetes mellitus type 2 which is  fairly uncontrolled she has not had her insulin for quite a while here it seems. I do not have the final A1c yet although her glucose in the hospital last week was 333. Nonetheless I do believe that this coupled with her smoking  is going to limit her healing to some degree nonetheless we need to get into trying to get her into a better place with regard to this. Her diabetes again is a big deal as far as healing is concerned right now in my opinion being that it still out of control. She also has a cardiac pacemaker she does not really know exactly what happened she tells me she went into DKA was in the hospital and then subsequently had a pacemaker following. 06/03/2021 upon evaluation today patient appears to be doing well with regard to her wounds on the toes. In general I think that she is doing excellent in my opinion. I do not see any signs of active infection which is great news. No fevers, chills, nausea, vomiting, or diarrhea. Unfortunately the alginate has been sticking to the wound beds. 06/10/2021 upon evaluation today patient appears to be doing well with regard to her wounds currently. Fortunately there does not appear to be any signs of infection. Still without making a tremendous amount of improvement but also things are getting significantly worse either which is also good news. Fortunately there is no evidence of infection at this point. The patient did have her appointment with Dr. Sophronia Long. He has recommended an arteriogram. She will also be having a vascular ultrasound for blood flow coming up in the next few days. 08/12/2021 upon evaluation today patient appears to be doing poorly in regard to her right great toe. This is daily wound left at this point her second toe is healed from last time I saw her and the left leg wound is actually gone as a result of the patient having what was called necrotizing fasciitis and having a below-knee amputation. In regards to the right great toe she did  have an MRI this MRI did show that she had evidence of edema with low T1 signal of the distal phalanx and partially within the proximal phalanx with adjacent soft tissue ulcer. This was compatible with early osteomyelitis or reactive marrow change. Based on what I am seeing I think the probability of early osteomyelitis is most likely. Fortunately there does not appear to be any signs of systemic infection which is great news. With all that being said I think the patient would benefit from the possibility of proceeding with HBO therapy. 08/19/2021 upon evaluation today patient appears to be doing well currently in regard to her wound all things considered though she still has a significant amount of drainage noted as well. Subsequently she is tolerating the dressing changes without complication. The Hydrofera Blue does seem to be beneficial up to this point. We are still working on the approval process for HBO therapy for her. Electronic Signature(s) Signed: 08/19/2021 2:50:45 PM By: Sarah Keeler PA-C Entered By: Sarah Long on 08/19/2021 14:50:45 -------------------------------------------------------------------------------- Physical Exam Details Patient Name: Date of Service: Sarah Long, Sarah EL R. 08/19/2021 1:00 PM Medical Record Number: [id number] Patient Account Number: [id number] Date of Birth/Sex: Treating RN: 10-08-1971 (50 y.o. Sarah Long Primary Care Provider: Asencion Long Other Clinician: Donavan Long Referring Provider: Treating Provider/Extender: Sarah Long in Treatment: 61 Constitutional Well-nourished and well-hydrated in no acute distress. Respiratory normal breathing without difficulty. Psychiatric this patient is able to make decisions and demonstrates good insight into disease process. Alert and Oriented x 3. pleasant and cooperative. Notes Patient's wound bed again showed signs of some hypergranulation and some normal  granulation. There is also some necrotic tissue noted around the edge  of the wound in the way of callus but she also had a very bright cyan fluorescence noted on evaluation with the MolecuLight Dx. This is indicative of Pseudomonas infection and I do believe coupled with everything else this is quite a significant issue here. I do believe this represents a major bacteria that may be causing her osteomyelitis which is still early but we need to get this under control ASAP. Electronic Signature(s) Signed: 08/19/2021 2:51:46 PM By: Sarah Keeler PA-C Entered By: Sarah Long on 08/19/2021 14:51:46 -------------------------------------------------------------------------------- Physician Orders Details Patient Name: Date of Service: Sarah Ferrari EL R. 08/19/2021 1:00 PM Medical Record Number: [id number] Patient Account Number: [id number] Date of Birth/Sex: Treating RN: 1971/07/20 (50 y.o. Sarah Long Primary Care Provider: Asencion Long Other Clinician: Donavan Long Referring Provider: Treating Provider/Extender: Sarah Long in Treatment: 12 Verbal / Phone Orders: No Diagnosis Coding ICD-10 Coding Code Description [id number] Other chronic osteomyelitis, right ankle and foot E11.621 Type 2 diabetes mellitus with foot ulcer L97.512 Non-pressure chronic ulcer of other part of right foot with fat layer exposed E11.43 Type 2 diabetes mellitus with diabetic autonomic (poly)neuropathy I10 Essential (primary) hypertension Z95.0 Presence of cardiac pacemaker Follow-up Appointments ppointment in 1 week. - with Margarita Grizzle Return A Bathing/ Shower/ Hygiene May shower with protection but do not get wound dressing(s) wet. Edema Control - Lymphedema / SCD / Other Elevate legs to the level of the heart or above for 30 minutes daily and/or when sitting, a frequency of: - throughout the day Avoid standing for long periods of time. Additional Orders /  Instructions Stop/Decrease Smoking Home Health Wound #3 Right T Great oe Admit to Home Health for wound care. May utilize formulary equivalent dressing for wound treatment orders unless otherwise specified. Dressing changes to be completed by [place] on Monday / Wednesday / Friday except when patient has scheduled visit at [facility]. Hyperbaric Oxygen Therapy Indication: - wagner grade 3 diabetic foot ulcer of right great toe If appropriate for treatment, begin HBOT per protocol: 2.0 ATA for 90 Minutes without A Breaks ir Total Number of Treatments: - 40 One treatments per day (delivered Monday through Friday unless otherwise specified in Special Instructions below): Finger stick Blood Glucose Pre- and Post- HBOT Treatment. Follow Hyperbaric Oxygen Glycemia Protocol Afrin (Oxymetazoline HCL) 0.05% nasal spray - 1 spray in both nostrils daily as needed prior to HBO treatment for difficulty clearing ears Wound Treatment Wound #3 - T Great oe Wound Laterality: Right Prim Dressing: Hydrofera Blue Ready Foam, 4x5 in Every Other Day/15 Days ary Discharge Instructions: Apply to wound bed cut to fit inside wound edges Secondary Dressing: Woven Gauze Sponges 2x2 in Every Other Day/15 Days Discharge Instructions: Apply over primary dressing as directed. Secured With: Child psychotherapist, Sterile 2x75 (in/in) Every Other Day/15 Days Discharge Instructions: Secure with stretch gauze . wrap toes individually Laboratory naerobe culture (MICRO) Bacteria identified in Unspecified specimen by A LOINC Code: [id number] Convenience Name: Anerobic culture Patient Medications llergies: strawberry, Sulfa (Sulfonamide Antibiotics) A Notifications Medication Indication Start End 08/19/2021 Levaquin DOSE 1 - oral 750 mg tablet - 1 tablet oral taken 1 time per day for 30 days GLYCEMIA INTERVENTIONS PROTOCOL PRE-HBO GLYCEMIA INTERVENTIONS ACTION INTERVENTION Obtain pre-HBO capillary  blood glucose (ensure 1 physician order is in chart). A. Notify HBO physician and await physician orders. 2 If result is 70 mg/dl or below: B. If the result meets the hospital definition of a critical result, follow hospital  policy. A. Give patient an 8 ounce Glucerna Shake, an 8 ounce Ensure, or 8 ounces of a Glucerna/Ensure equivalent dietary supplement*. B. Wait 30 minutes. If result is 71 mg/dl to 130 mg/dl: C. Retest patients capillary blood glucose (CBG). D. If result greater than or equal to 110 mg/dl, proceed with HBO. If result less than 110 mg/dl, notify HBO physician and consider holding HBO. If result is 131 mg/dl to 249 mg/dl: A. Proceed with HBO. A. Notify HBO physician and await physician orders. B. It is recommended to hold HBO and do If result is 250 mg/dl or greater: blood/urine ketone testing. C. If the result meets the hospital definition of a critical result, follow hospital policy. POST-HBO GLYCEMIA INTERVENTIONS ACTION INTERVENTION Obtain post HBO capillary blood glucose (ensure 1 physician order is in chart). A. Notify HBO physician and await physician orders. 2 If result is 70 mg/dl or below: B. If the result meets the hospital definition of a critical result, follow hospital policy. A. Give patient an 8 ounce Glucerna Shake, an 8 ounce Ensure, or 8 ounces of a Glucerna/Ensure equivalent dietary supplement*. B. Wait 15 minutes for symptoms of If result is 71 mg/dl to 100 mg/dl: hypoglycemia (i.e. nervousness, anxiety, sweating, chills, clamminess, irritability, confusion, tachycardia or dizziness). C. If patient asymptomatic, discharge patient. If patient symptomatic, repeat capillary blood glucose (CBG) and notify HBO physician. If result is 101 mg/dl to 249 mg/dl: A. Discharge patient. A. Notify HBO physician and await physician orders. B. It is recommended to do blood/urine ketone B. It is recommended to do blood/urine ketone If  result is 250 mg/dl or greater: testing. C. If the result meets the hospital definition of a critical result, follow hospital policy. *Juice or candies are NOT equivalent products. If patient refuses the Glucerna or Ensure, please consult the hospital dietitian for an appropriate substitute. Electronic Signature(s) Signed: 08/19/2021 5:28:57 PM By: Sarah Keeler PA-C Signed: 08/19/2021 6:35:12 PM By: Sarah Gouty RN, BSN Previous Signature: 08/19/2021 2:56:08 PM Version By: Sarah Keeler PA-C Entered By: Sarah Long on 08/19/2021 14:58:38 -------------------------------------------------------------------------------- Problem List Details Patient Name: Date of Service: Sarah Ferrari EL R. 08/19/2021 1:00 PM Medical Record Number: [id number] Patient Account Number: [id number] Date of Birth/Sex: Treating RN: May 16, 1971 (50 y.o. Sarah Long Primary Care Provider: Asencion Long Other Clinician: Referring Provider: Treating Provider/Extender: Sarah Long in Treatment: 12 Active Problems ICD-10 Encounter Code Description Active Date MDM Diagnosis [phone] Other chronic osteomyelitis, right ankle and foot 08/12/2021 No Yes E11.621 Type 2 diabetes mellitus with foot ulcer 05/27/2021 No Yes L97.512 Non-pressure chronic ulcer of other part of right foot with fat layer exposed 05/27/2021 No Yes E11.43 Type 2 diabetes mellitus with diabetic autonomic (poly)neuropathy 05/27/2021 No Yes I10 Essential (primary) hypertension 05/27/2021 No Yes Z95.0 Presence of cardiac pacemaker 05/27/2021 No Yes Inactive Problems Resolved Problems ICD-10 Code Description Active Date Resolved Date L97.522 Non-pressure chronic ulcer of other part of left foot with fat layer exposed 05/27/2021 05/27/2021 Electronic Signature(s) Signed: 08/19/2021 1:19:20 PM By: Sarah Keeler PA-C Entered By: Sarah Long on 08/19/2021  13:19:19 -------------------------------------------------------------------------------- Progress Note Details Patient Name: Date of Service: Sarah Ferrari EL R. 08/19/2021 1:00 PM Medical Record Number: [id number] Patient Account Number: [id number] Date of Birth/Sex: Treating RN: [birth year] (50 y.o. Sarah Long Primary Care Provider: Asencion Long Other Clinician: Donavan Long Referring Provider: Treating Provider/Extender: Sarah Long in Treatment: 12 Subjective Chief Complaint Information obtained from Patient Bilateral toe  ulcers History of Present Illness (HPI) 05/27/2021 upon evaluation today patient appears to be doing poorly in regard to her toes on the left and right foot. She has the first and second toe on the left foot as well as the fifth toe. On the right foot is just the first and second toe. With that being said she does not know any injury or otherwise that occurred. She states she just looked down and noticed that it was there and again that was just within the last week or so around 13 June. She does not know of anything that happened she has not gone barefoot she tells me that she wear shoes and again has not been outside barefoot at any point in time. Fortunately there does not appear to be any signs of active infection at this time. No fevers, chills, nausea, vomiting, or diarrhea. With that being said she is on Keflex she was also on doxycycline previous. She does have a strong history of diabetes mellitus type 2 which is fairly uncontrolled she has not had her insulin for quite a while here it seems. I do not have the final A1c yet although her glucose in the hospital last week was 333. Nonetheless I do believe that this coupled with her smoking is going to limit her healing to some degree nonetheless we need to get into trying to get her into a better place with regard to this. Her diabetes again is a big deal as far as healing is  concerned right now in my opinion being that it still out of control. She also has a cardiac pacemaker she does not really know exactly what happened she tells me she went into DKA was in the hospital and then subsequently had a pacemaker following. 06/03/2021 upon evaluation today patient appears to be doing well with regard to her wounds on the toes. In general I think that she is doing excellent in my opinion. I do not see any signs of active infection which is great news. No fevers, chills, nausea, vomiting, or diarrhea. Unfortunately the alginate has been sticking to the wound beds. 06/10/2021 upon evaluation today patient appears to be doing well with regard to her wounds currently. Fortunately there does not appear to be any signs of infection. Still without making a tremendous amount of improvement but also things are getting significantly worse either which is also good news. Fortunately there is no evidence of infection at this point. The patient did have her appointment with Dr. Sophronia Long. He has recommended an arteriogram. She will also be having a vascular ultrasound for blood flow coming up in the next few days. 08/12/2021 upon evaluation today patient appears to be doing poorly in regard to her right great toe. This is daily wound left at this point her second toe is healed from last time I saw her and the left leg wound is actually gone as a result of the patient having what was called necrotizing fasciitis and having a below-knee amputation. In regards to the right great toe she did have an MRI this MRI did show that she had evidence of edema with low T1 signal of the distal phalanx and partially within the proximal phalanx with adjacent soft tissue ulcer. This was compatible with early osteomyelitis or reactive marrow change. Based on what I am seeing I think the probability of early osteomyelitis is most likely. Fortunately there does not appear to be any signs of systemic infection which is  great news. With  all that being said I think the patient would benefit from the possibility of proceeding with HBO therapy. 08/19/2021 upon evaluation today patient appears to be doing well currently in regard to her wound all things considered though she still has a significant amount of drainage noted as well. Subsequently she is tolerating the dressing changes without complication. The Hydrofera Blue does seem to be beneficial up to this point. We are still working on the approval process for HBO therapy for her. Objective Constitutional Well-nourished and well-hydrated in no acute distress. Vitals Time Taken: 1:23 PM, Height: 64 in, Weight: 200 lbs, BMI: 34.3, Temperature: 98.3 F, Pulse: 89 bpm, Respiratory Rate: 18 breaths/min, Blood Pressure: 132/72 mmHg. Respiratory normal breathing without difficulty. Psychiatric this patient is able to make decisions and demonstrates good insight into disease process. Alert and Oriented x 3. pleasant and cooperative. General Notes: Patient's wound bed again showed signs of some hypergranulation and some normal granulation. There is also some necrotic tissue noted around the edge of the wound in the way of callus but she also had a very bright cyan fluorescence noted on evaluation with the MolecuLight Dx. This is indicative of Pseudomonas infection and I do believe coupled with everything else this is quite a significant issue here. I do believe this represents a major bacteria that may be causing her osteomyelitis which is still early but we need to get this under control ASAP. Integumentary (Hair, Skin) Wound #3 status is Open. Original cause of wound was Other Lesion. The date acquired was: 05/18/2021. The wound has been in treatment 12 weeks. The wound is located on the Right T Great. The wound measures 2.1cm length x 1cm width x 1.1cm depth; 1.649cm^2 area and 1.814cm^3 volume. There is Fat oe Layer (Subcutaneous Tissue) exposed. There is  undermining starting at 6:00 and ending at 11:00 with a maximum distance of 1cm. There is a medium amount of serosanguineous drainage noted. The wound margin is well defined and not attached to the wound base. There is large (67-100%) red, hyper - granulation within the wound bed. There is no necrotic tissue within the wound bed. Assessment Active Problems ICD-10 Other chronic osteomyelitis, right ankle and foot Type 2 diabetes mellitus with foot ulcer Non-pressure chronic ulcer of other part of right foot with fat layer exposed Type 2 diabetes mellitus with diabetic autonomic (poly)neuropathy Essential (primary) hypertension Presence of cardiac pacemaker Procedures Wound #3 Pre-procedure diagnosis of Wound #3 is a Diabetic Wound/Ulcer of the Lower Extremity located on the Right T Great .Severity of Tissue Pre Debridement is: oe Bone involvement without necrosis. There was a Excisional Skin/Subcutaneous Tissue Debridement with a total area of 2.1 sq cm performed by Sarah Keeler, PA. With the following instrument(s): Curette to remove Viable and Non-Viable tissue/material. Material removed includes Callus, Subcutaneous Tissue, Slough, and Skin: Epidermis. No specimens were taken. A time out was conducted at 14:40, prior to the start of the procedure. A Minimum amount of bleeding was controlled with Pressure. The procedure was tolerated well with a pain level of 0 throughout and a pain level of 0 following the procedure. Post Debridement Measurements: 2.1cm length x 1cm width x 1.1cm depth; 1.814cm^3 volume. Character of Wound/Ulcer Post Debridement is improved. Severity of Tissue Post Debridement is: Bone involvement without necrosis. Post procedure Diagnosis Wound #3: Same as Pre-Procedure Plan Follow-up Appointments: Return Appointment in 1 week. - with [facility] Shower/ Hygiene: May shower with protection but do not get wound dressing(s) wet. Edema Control -  Lymphedema / SCD /  Other: Elevate legs to the level of the heart or above for 30 minutes daily and/or when sitting, a frequency of: - throughout the day Avoid standing for long periods of time. Additional Orders / Instructions: Stop/Decrease Smoking Home Health: Wound #3 Right T Great: oe Admit to Home Health for wound care. May utilize formulary equivalent dressing for wound treatment orders unless otherwise specified. Dressing changes to be completed by [place] on Monday / Wednesday / Friday except when patient has scheduled visit at [facility]. Hyperbaric Oxygen Therapy: Indication: - wagner grade 3 diabetic foot ulcer of right great toe If appropriate for treatment, begin HBOT per protocol: 2.0 ATA for 90 Minutes without Air Breaks T Number of Treatments: - 40 otal One treatments per day (delivered Monday through Friday unless otherwise specified in Special Instructions below): Finger stick Blood Glucose Pre- and Post- HBOT Treatment. Follow Hyperbaric Oxygen Glycemia Protocol Afrin (Oxymetazoline HCL) 0.05% nasal spray - 1 spray in both nostrils daily as needed prior to HBO treatment for difficulty clearing ears Laboratory ordered were: Anerobic culture The following medication(s) was prescribed: Levaquin oral 750 mg tablet 1 1 tablet oral taken 1 time per day for 30 days starting 08/19/2021 WOUND #3: - T Great Wound Laterality: Right oe Prim Dressing: Hydrofera Blue Ready Foam, 4x5 in Every Other Day/15 Days ary Discharge Instructions: Apply to wound bed cut to fit inside wound edges Secondary Dressing: Woven Gauze Sponges 2x2 in Every Other Day/15 Days Discharge Instructions: Apply over primary dressing as directed. Secured With: Child psychotherapist, Sterile 2x75 (in/in) Every Other Day/15 Days Discharge Instructions: Secure with stretch gauze . wrap toes individually 1. Would recommend currently that we go ahead and initiate treatment with an antibiotic. I think that  specifically Levaquin would be the ideal thing here. I will send that prescription into the pharmacy for her. 2. I am also can recommend at this time that we have the patient continue to offload her foot as much as possible. I think that this is definitely something that would help with healing although pressures not the main issue here. 3. I am also going to recommend that we continue with the [facility] which I think is doing a good job that is probably still our best option here. We will see patient back for reevaluation in 1 week here in the clinic. If anything worsens or changes patient will contact our office for additional recommendations. MolecuLight DX: 1st Scanned Wound The following wound was scanned with MolecuLight DX): Right great toe Fluorescence bacterial imaging was medically necessary today due to Initial Evaluation of the wound with MolecuLightDX to determine baseline (Indication): bacterial bioburden level MolecuLight Results Cyan Colors., Green Colors The indicated colors were noted in the following area(s). In the periphery of the wound As a result of todays scan, the following treatment plans were put in place. Culture obtained and patient started on Levaquin due to presence of Pseudomonas. MolecuLight Procedure The MolecularLight DX device was cleaned with a disinfectant wipe prior to use., The correct patient profile was confirmed and correct wound was verified., Range finder sensor used to ensure appropriate distance selected The following was completed: between imaging unit and wound bed, Room lights were turned off and the ambient light sensor was checked., Blue circle appeared around the lightbulb., The fluorescence icon was selected. Screen was tapped to enhance focus and the image was captured. Additional drapes were used to ensure adequate darknesso No Potential ICD-10  Codes ICD-10 B96.5 pseudomonas (Cyan Color) Additional Scanned Wounds Did you scan any  additional Woundso No Electronic Signature(s) Signed: 08/19/2021 5:28:57 PM By: Sarah Keeler PA-C Signed: 08/19/2021 6:35:12 PM By: Sarah Gouty RN, BSN Previous Signature: 08/19/2021 2:58:05 PM Version By: Sarah Keeler PA-C Entered By: Sarah Long on 08/19/2021 14:58:50 -------------------------------------------------------------------------------- SuperBill Details Patient Name: Date of Service: Sarah Haring R. 08/19/2021 Medical Record Number: [id number] Patient Account Number: [id number] Date of Birth/Sex: Treating RN: 05/05/1971 (50 y.o. Sarah Long, Sarah Long Primary Care Provider: Asencion Long Other Clinician: Donavan Long Referring Provider: Treating Provider/Extender: Sarah Long in Treatment: 12 Diagnosis Coding ICD-10 Codes Code Description [phone] Other chronic osteomyelitis, right ankle and foot E11.621 Type 2 diabetes mellitus with foot ulcer L97.512 Non-pressure chronic ulcer of other part of right foot with fat layer exposed E11.43 Type 2 diabetes mellitus with diabetic autonomic (poly)neuropathy I10 Essential (primary) hypertension Z95.0 Presence of cardiac pacemaker Facility Procedures CPT4 Code: [id number] Description: [place] - DEB SUBQ TISSUE 20 SQ CM/< ICD-10 Diagnosis Description L97.512 Non-pressure chronic ulcer of other part of right foot with fat layer exposed Modifier: Quantity: 1 Physician Procedures : CPT4 Code Description Modifier [id number] - WC PHYS LEVEL 4 - EST PT 25 ICD-10 Diagnosis Description M86.671 Other chronic osteomyelitis, right ankle and foot E11.621 Type 2 diabetes mellitus with foot ulcer L97.512 Non-pressure chronic ulcer of  other part of right foot with fat layer exposed E11.43 Type 2 diabetes mellitus with diabetic autonomic (poly)neuropathy Quantity: 1 : [id number] - WC PHYS SUBQ TISS 20 SQ CM ICD-10 Diagnosis Description L97.512 Non-pressure chronic ulcer of other part of right foot  with fat layer exposed Quantity: 1 : 0598T NONCNTACT RT FLORO WND 1ST STE 59 ICD-10 Diagnosis Description L97.512 Non-pressure chronic ulcer of other part of right foot with fat layer exposed Quantity: 1 Electronic Signature(s) Signed: 08/19/2021 6:35:12 PM By: Sarah Gouty RN, BSN Signed: 08/20/2021 7:40:39 AM By: Sarah Keeler PA-C Previous Signature: 08/19/2021 3:03:04 PM Version By: Sarah Keeler PA-C Entered By: Sarah Long on 08/19/2021 18:18:56

## 2021-08-19 NOTE — Progress Notes (Signed)
Sarah Long, Sarah Long ([id number]) Visit Report for 08/19/2021 Arrival Information Details Patient Name: Date of Service: LAENA, LICK. 08/19/2021 1:00 PM Medical Record Number: [id number] Patient Account Number: [id number] Date of Birth/Sex: Treating RN: 23-Jul-1971 (50 y.o. [name], [name] Primary Care Pheng Prokop: Asencion Noble Other Clinician: Donavan Burnet Referring Charlet Harr: Treating Mckenzi Buonomo/Extender: Emelda Brothers in Treatment: 12 Visit Information History Since Last Visit All ordered tests and consults were completed: Yes Patient Arrived: Wheel Chair Added or deleted any medications: No Arrival Time: 13:00 Any new allergies or adverse reactions: No Accompanied By: self Had a fall or experienced change in No Transfer Assistance: None activities of daily living that may affect Patient Identification Verified: Yes risk of falls: Secondary Verification Process Completed: Yes Signs or symptoms of abuse/neglect since last visito No Patient Requires Transmission-Based Precautions: No Hospitalized since last visit: No Patient Has Alerts: Yes Implantable device outside of the clinic excluding No cellular tissue based products placed in the center since last visit: Pain Present Now: No Electronic Signature(s) Signed: 08/19/2021 6:20:23 PM By: Donavan Burnet EMT Entered By: Donavan Burnet on 08/19/2021 13:21:58 -------------------------------------------------------------------------------- Encounter Discharge Information Details Patient Name: Date of Service: [name] 08/19/2021 1:00 PM Medical Record Number: [id number] Patient Account Number: [id number] Date of Birth/Sex: Treating RN: [birth year] (50 y.o. Sarah Long Primary Care Dorinne Graeff: Asencion Noble Other Clinician: Donavan Burnet Referring Emillia Weatherly: Treating Shanteria Laye/Extender: Emelda Brothers in Treatment: 12 Encounter Discharge Information Items  Post Procedure Vitals Discharge Condition: Stable Temperature (F): 98.3 Ambulatory Status: Wheelchair Pulse (bpm): 89 Discharge Destination: Home Respiratory Rate (breaths/min): 18 Transportation: Private Auto Blood Pressure (mmHg): 132/72 Accompanied By: self Schedule Follow-up Appointment: Yes Clinical Summary of Care: Patient Declined Electronic Signature(s) Signed: 08/19/2021 6:35:12 PM By: Baruch Gouty RN, BSN Entered By: Baruch Gouty on 08/19/2021 18:23:38 -------------------------------------------------------------------------------- Lower Extremity Assessment Details Patient Name: Date of Service: [name] 08/19/2021 1:00 PM Medical Record Number: [id number] Patient Account Number: [id number] Date of Birth/Sex: Treating RN: 07/11/1971 (50 y.o. Sarah Long Primary Care Sloane Junkin: Asencion Noble Other Clinician: Donavan Burnet Referring Danaysia Rader: Treating Damita Eppard/Extender: Emelda Brothers in Treatment: 12 Edema Assessment Assessed: Shirlyn Goltz: No] Patrice Paradise: No] Edema: [Left: N] [Right: o] Calf Left: Right: Point of Measurement: 30 cm From Medial Instep 37.3 cm Ankle Left: Right: Point of Measurement: 9 cm From Medial Instep 25.3 cm Electronic Signature(s) Signed: 08/19/2021 6:20:23 PM By: Donavan Burnet EMT Signed: 08/19/2021 6:35:12 PM By: Baruch Gouty RN, BSN Entered By: Donavan Burnet on 08/19/2021 13:38:39 -------------------------------------------------------------------------------- Multi-Disciplinary Care Plan Details Patient Name: Date of Service: [name] 08/19/2021 1:00 PM Medical Record Number: [id number] Patient Account Number: [id number] Date of Birth/Sex: Treating RN: Mar 01, 1971 (50 y.o. Sarah Long Primary Care Jerrin Recore: Asencion Noble Other Clinician: Donavan Burnet Referring Gyanna Jarema: Treating Paul Torpey/Extender: Emelda Brothers in Treatment:  [place] reviewed with physician Active Inactive Nutrition Nursing Diagnoses: Impaired glucose control: actual or potential Potential for alteratiion in Nutrition/Potential for imbalanced nutrition Goals: Patient/caregiver verbalizes understanding of need to maintain therapeutic glucose control per primary care physician Date Initiated: 05/27/2021 Target Resolution Date: 09/09/2021 Goal Status: Active Patient/caregiver will maintain therapeutic glucose control Date Initiated: 05/27/2021 Target Resolution Date: 09/09/2021 Goal Status: Active Interventions: Assess patient nutrition upon admission and as needed per policy Provide education on nutrition Treatment Activities: Patient referred to Primary Care Physician for further nutritional evaluation : 05/27/2021 Notes: Wound/Skin Impairment Nursing Diagnoses: Impaired tissue  integrity Knowledge deficit related to smoking impact on wound healing Knowledge deficit related to ulceration/compromised skin integrity Goals: Patient will demonstrate a reduced rate of smoking or cessation of smoking Date Initiated: 05/27/2021 Target Resolution Date: 09/09/2021 Goal Status: Active Patient/caregiver will verbalize understanding of skin care regimen Date Initiated: 05/27/2021 Target Resolution Date: 09/09/2021 Goal Status: Active Ulcer/skin breakdown will have a volume reduction of 30% by week 4 Date Initiated: 05/27/2021 Date Inactivated: 08/12/2021 Target Resolution Date: 06/24/2021 Goal Status: Unmet Unmet Reason: required left BKA Interventions: Assess patient/caregiver ability to obtain necessary supplies Assess patient/caregiver ability to perform ulcer/skin care regimen upon admission and as needed Assess ulceration(s) every visit Provide education on smoking Provide education on ulcer and skin care Treatment Activities: Skin care regimen initiated : 05/27/2021 Topical wound management initiated :  05/27/2021 Notes: Electronic Signature(s) Signed: 08/19/2021 6:35:12 PM By: Baruch Gouty RN, BSN Entered By: Baruch Gouty on 08/19/2021 14:20:10 -------------------------------------------------------------------------------- Pain Assessment Details Patient Name: Date of Service: [name] 08/19/2021 1:00 PM Medical Record Number: [id number] Patient Account Number: [id number] Date of Birth/Sex: Treating RN: [birth year] (50 y.o. Sarah Long Primary Care Yariel Ferraris: Asencion Noble Other Clinician: Donavan Burnet Referring Ginevra Tacker: Treating Rosangela Fehrenbach/Extender: Emelda Brothers in Treatment: 12 Active Problems Location of Pain Severity and Description of Pain Patient Has Paino No Site Locations Pain Management and Medication Current Pain Management: Electronic Signature(s) Signed: 08/19/2021 6:20:23 PM By: Donavan Burnet EMT Signed: 08/19/2021 6:35:12 PM By: Baruch Gouty RN, BSN Entered By: Donavan Burnet on 08/19/2021 13:26:26 -------------------------------------------------------------------------------- Patient/Caregiver Education Details Patient Name: Date of Service: Christner, NO EL R. 9/14/2022andnbsp1:00 PM Medical Record Number: [id number] Patient Account Number: [id number] Date of Birth/Gender: Treating RN: Feb 19, 1971 (50 y.o. Sarah Long Primary Care Physician: Asencion Noble Other Clinician: Donavan Burnet Referring Physician: Treating Physician/Extender: Emelda Brothers in Treatment: 12 Education Assessment Education Provided To: Patient Education Topics Provided Hyperbaric Oxygenation: Methods: Explain/Verbal Responses: Reinforcements needed, State content correctly Smoking and Wound Healing: Methods: Explain/Verbal Responses: Reinforcements needed, State content correctly Wound/Skin Impairment: Methods: Explain/Verbal Responses: Reinforcements needed, State content  correctly Electronic Signature(s) Signed: 08/19/2021 6:35:12 PM By: Baruch Gouty RN, BSN Entered By: Baruch Gouty on 08/19/2021 14:20:44 -------------------------------------------------------------------------------- Wound Assessment Details Patient Name: Date of Service: [name] 08/19/2021 1:00 PM Medical Record Number: [id number] Patient Account Number: [id number] Date of Birth/Sex: Treating RN: 07-13-1971 (50 y.o. [name], [name] Primary Care Cloria Ciresi: Asencion Noble Other Clinician: Donavan Burnet Referring Crews Mccollam: Treating Tesneem Dufrane/Extender: Emelda Brothers in Treatment: 12 Wound Status Wound Number: 3 Primary Etiology: Diabetic Wound/Ulcer of the Lower Extremity Wound Location: Right T Great oe Wound Status: Open Wounding Event: Other Lesion Comorbid History: Glaucoma, Hypertension, Type II Diabetes, Neuropathy Date Acquired: 05/18/2021 Weeks Of Treatment: 12 Clustered Wound: No Photos Wound Measurements Length: (cm) 2.1 % Redu Width: (cm) 1 % Redu Depth: (cm) 1.1 Epithe Area: (cm) 1.649 Under Volume: (cm) 1.814 St End Max ction in Area: 90.5% ction in Volume: 47.5% lialization: Small (1-33%) mining: Yes arting Position (o'clock): 6 ing Position (o'clock): 11 imum Distance: (cm) 1 Wound Description Classification: Grade 3 Foul O Wound Margin: Well defined, not attached Slough Exudate Amount: Medium Exudate Type: Serosanguineous Exudate Color: red, brown dor After Cleansing: No /Fibrino Yes Wound Bed Granulation Amount: Large (67-100%) Exposed Structure Granulation Quality: Red, Hyper-granulation Fascia Exposed: No Necrotic Amount: None Present (0%) Fat Layer (Subcutaneous Tissue) Exposed: Yes Tendon Exposed: No Muscle Exposed: No Joint Exposed: No Bone Exposed:  No Treatment Notes Wound #3 (Toe Great) Wound Laterality: Right Cleanser Peri-Wound Care Topical Primary Dressing Hydrofera Blue Ready Foam,  4x5 in Discharge Instruction: Apply to wound bed cut to fit inside wound edges Secondary Dressing Woven Gauze Sponges 2x2 in Discharge Instruction: Apply over primary dressing as directed. Secured With Conforming Stretch Gauze Bandage, Sterile 2x75 (in/in) Discharge Instruction: Secure with stretch gauze . wrap toes individually Compression Wrap Compression Stockings Add-Ons Electronic Signature(s) Signed: 08/19/2021 6:35:12 PM By: Baruch Gouty RN, BSN Previous Signature: 08/19/2021 2:44:00 PM Version By: Donavan Burnet EMT Entered By: Baruch Gouty on 08/19/2021 14:48:32 -------------------------------------------------------------------------------- Vitals Details Patient Name: Date of Service: [name] 08/19/2021 1:00 PM Medical Record Number: [id number] Patient Account Number: [id number] Date of Birth/Sex: Treating RN: [birth year] (50 y.o. Sarah Long Primary Care Uzair Godley: Asencion Noble Other Clinician: Donavan Burnet Referring Stevon Gough: Treating [place] Throckmorton/Extender: Emelda Brothers in Treatment: 12 Vital Signs Time Taken: 13:23 Temperature (F): 98.3 Height (in): 64 Pulse (bpm): 89 Weight (lbs): 200 Respiratory Rate (breaths/min): 18 Body Mass Index (BMI): 34.3 Blood Pressure (mmHg): 132/72 Reference Range: 80 - 120 mg / dl Electronic Signature(s) Signed: 08/19/2021 6:20:23 PM By: Donavan Burnet EMT Entered By: Donavan Burnet on 08/19/2021 13:26:12

## 2021-08-21 ENCOUNTER — Other Ambulatory Visit: Payer: Self-pay

## 2021-08-21 ENCOUNTER — Encounter: Payer: Self-pay | Admitting: Pharmacist

## 2021-08-21 ENCOUNTER — Ambulatory Visit: Payer: Medicaid Other | Attending: Critical Care Medicine | Admitting: Pharmacist

## 2021-08-21 DIAGNOSIS — E1142 Type 2 diabetes mellitus with diabetic polyneuropathy: Secondary | ICD-10-CM | POA: Diagnosis not present

## 2021-08-21 DIAGNOSIS — Z794 Long term (current) use of insulin: Secondary | ICD-10-CM

## 2021-08-21 NOTE — Progress Notes (Signed)
S:    PCP: Dr. Joya Gaskins   Patient arrives in good spirits.  Presents for diabetes evaluation, education, and management. Patient was referred and last seen by Primary Care Provider on 07/30/2021. At that visit, POC BG was 255. Lantus (insulin glargine) was increased from 45 units to 60 units and Humalog (insulin lispro) 5 units TID was added. Last A1c was 13.2 on 07/02/2021.   Today patient reports medication non-adherence mostly due to her forgetting to take her insulin or feeling overwhelmed by her recent health decline. Didn't take any insulin yesterday. Changed dosing to night instead of morning. Feeling overwhelmed.  Social determinants are likely playing a role in her DM control. She is currently living in a hotel with limited access to quality food. She mostly eats fast food and candy. She reports that Halloween is a tough time for her being surrounded by candy. She previously only had access to a microwave at her hotel but now has a deep fryer and toaster oven also in the hotel room. Advised patient against using deep fryer and elect to use the toaster oven. She also request to be referred to a specialist because "This isn't working for me, I'm not getting enough insulin to keep up with what I'm eating".  Insurance coverage/medication affordability: MEDICAID  ACCESS  Medication adherence denied.  Patient often forgets to take Lantus in the morning (says this is a hectic time of day for her). She also has self adjusted her Humalog to 10 units, stating "I know my body I know I need more insulin". She also takes Humalog AFTER meals because that's when she remembers.  Current diabetes medications include: Lantus (insulin glargine) 60 units daily, Humalog 5 untis TID (self increased to 10 units) Current hyperlipidemia medications include: rosuvastatin 10 mg   Patient denies hypoglycemic events. Lowest 106 mg/dL  Patient reported dietary habits: Eats 1 meals/day Breakfast:[employer],  take out  Lunch: Sandwich, lots of fast food Dinner: pizza, burgers  Snacks: candy, twizzlers  Drinks: sweet tea, Zero Sprite  Patient reports nocturia (nighttime urination).  Patient reports neuropathy (nerve pain). Patient denies visual changes. Patient reports self foot exams.   O:  Lab Results  Component Value Date   HGBA1C 13.2 (H) 07/02/2021   There were no vitals filed for this visit.  Lipid Panel     Component Value Date/Time   CHOL 98 (L) 06/18/2021 1128   TRIG 117 06/18/2021 1128   HDL 37 (L) 06/18/2021 1128   CHOLHDL 2.6 06/18/2021 1128   CHOLHDL 3.5 04/28/2020 1511   VLDL 25 04/28/2020 1511   LDLCALC 40 06/18/2021 1128   Home fasting blood sugars: Average 236 08/19/21 332 8:49 AM 08/18/21 169 8:12 AM 08/17/21 213 7:32 AM, 126 5:13 pm 08/16/21 321 7:47 AM 08/15/21 256 10:35 AM 08/14/21 331 8:22 AM, 331 9:33 PM 08/13/21 247 6:37 PM 08/12/21 204 7:23 AM Thinks its diet.   2 hour post-meal/random blood sugars: doesn't check.  Clinical Atherosclerotic Cardiovascular Disease (ASCVD): Yes  The ASCVD Risk score (Arnett DK, et al., 2019) failed to calculate for the following reasons:   The patient has a prior MI or stroke diagnosis    A/P: Diabetes longstanding, currently uncontrolled based on A1c and CBG. Patient is able to verbalize appropriate hypoglycemia management plan. Medication adherence appears suboptimal due to patient forgetting to take doses, taking Humalog AFTER meals, and eating fast food and candy most days. Educated patient on the importance of being consistent with taking insulin so  we can accurately adjust her regimen. Also expressed the importance of being consistent before she is seen by an endocrinologist.  -Continued basal insulin Lantus (insulin glargine) 60 units. Switched timing of injection from morning to night as that is the most convenient for her.  -Given that after she self titrated her Humalog to 10 units and she was not having hypoglycemia,  will continue Humalog (insulin lispro) 10 units TID AC. Educated patient the importance of taking Humalog BEFORE meals. -Instructed patient to call the clinic if she has any BG readings <70 mg/dL.  -Patient would likely benefit from a GLP-1 RA once BG is more controlled.  -Extensively discussed pathophysiology of diabetes, recommended lifestyle interventions, dietary effects on blood sugar control -Counseled on s/sx of and management of hypoglycemia -Next A1C anticipated 09/2021.    Written patient instructions provided.  Total time in face to face counseling 40 minutes.   Follow up PCP Clinic Visit in 1 month.  Pauletta Browns, Pharm.D. PGY-1 Pharmacy Resident [id number] 08/21/2021 11:26 AM

## 2021-08-24 LAB — AEROBIC/ANAEROBIC CULTURE W GRAM STAIN (SURGICAL/DEEP WOUND): Gram Stain: NONE SEEN

## 2021-08-26 ENCOUNTER — Encounter (HOSPITAL_BASED_OUTPATIENT_CLINIC_OR_DEPARTMENT_OTHER): Payer: Medicaid Other | Admitting: Physician Assistant

## 2021-08-26 ENCOUNTER — Other Ambulatory Visit: Payer: Self-pay

## 2021-08-26 DIAGNOSIS — E11621 Type 2 diabetes mellitus with foot ulcer: Secondary | ICD-10-CM | POA: Diagnosis not present

## 2021-08-26 NOTE — Progress Notes (Addendum)
Sarah Long, Sarah Long ([id number]) Visit Report for 08/26/2021 Chief Complaint Document Details Patient Name: Date of Service: Sarah Long, Sarah Long 08/26/2021 1:15 PM Medical Record Number: [id number] Patient Account Number: [id number] Date of Birth/Sex: Treating RN: August 10, 1971 (50 y.o. [name] Primary Care Provider: Asencion Noble Other Clinician: Referring Provider: Treating Provider/Extender: Emelda Brothers in Treatment: 13 Information Obtained from: Patient Chief Complaint Bilateral toe ulcers Electronic Signature(s) Signed: 08/26/2021 1:27:21 PM By: [name] PA-C Entered By: [name] on 08/26/2021 13:27:20 -------------------------------------------------------------------------------- HPI Details Patient Name: Date of Service: Sarah Ferrari EL R. 08/26/2021 1:15 PM Medical Record Number: [id number] Patient Account Number: [id number] Date of Birth/Sex: Treating RN: 10-12-1971 (50 y.o. [name] Primary Care Provider: Asencion Noble Other Clinician: Referring Provider: Treating Provider/Extender: Emelda Brothers in Treatment: 13 History of Present Illness HPI Description: 05/27/2021 upon evaluation today patient appears to be doing poorly in regard to her toes on the left and right foot. She has the first and second toe on the left foot as well as the fifth toe. On the right foot is just the first and second toe. With that being said she does not know any injury or otherwise that occurred. She states she just looked down and noticed that it was there and again that was just within the last week or so around 13 June. She does not know of anything that happened she has not gone barefoot she tells me that she wear shoes and again has not been outside barefoot at any point in time. Fortunately there does not appear to be any signs of active infection at this time. No fevers, chills, nausea, vomiting, or diarrhea. With that  being said she is on Keflex she was also on doxycycline previous. She does have a strong history of diabetes mellitus type 2 which is fairly uncontrolled she has not had her insulin for quite a while here it seems. I do not have the final A1c yet although her glucose in the hospital last week was 333. Nonetheless I do believe that this coupled with her smoking is going to limit her healing to some degree nonetheless we need to get into trying to get her into a better place with regard to this. Her diabetes again is a big deal as far as healing is concerned right now in my opinion being that it still out of control. She also has a cardiac pacemaker she does not really know exactly what happened she tells me she went into DKA was in the hospital and then subsequently had a pacemaker following. 06/03/2021 upon evaluation today patient appears to be doing well with regard to her wounds on the toes. In general I think that she is doing excellent in my opinion. I do not see any signs of active infection which is great news. No fevers, chills, nausea, vomiting, or diarrhea. Unfortunately the alginate has been sticking to the wound beds. 06/10/2021 upon evaluation today patient appears to be doing well with regard to her wounds currently. Fortunately there does not appear to be any signs of infection. Still without making a tremendous amount of improvement but also things are getting significantly worse either which is also good news. Fortunately there is no evidence of infection at this point. The patient did have her appointment with Dr. Sophronia Simas. He has recommended an arteriogram. She will also be having a vascular ultrasound for blood flow coming up in the next few days. 08/12/2021  upon evaluation today patient appears to be doing poorly in regard to her right great toe. This is daily wound left at this point her second toe is healed from last time I saw her and the left leg wound is actually gone as a result of  the patient having what was called necrotizing fasciitis and having a below-knee amputation. In regards to the right great toe she did have an MRI this MRI did show that she had evidence of edema with low T1 signal of the distal phalanx and partially within the proximal phalanx with adjacent soft tissue ulcer. This was compatible with early osteomyelitis or reactive marrow change. Based on what I am seeing I think the probability of early osteomyelitis is most likely. Fortunately there does not appear to be any signs of systemic infection which is great news. With all that being said I think the patient would benefit from the possibility of proceeding with HBO therapy. 08/19/2021 upon evaluation today patient appears to be doing well currently in regard to her wound all things considered though she still has a significant amount of drainage noted as well. Subsequently she is tolerating the dressing changes without complication. The Hydrofera Blue does seem to be beneficial up to this point. We are still working on the approval process for HBO therapy for her. 08/26/2021 upon evaluation today patient appears to be doing better in regard to her toe ulcer. I am actually seeing signs of improvement which is great news and overall very pleased in that regard. Fortunately there does not appear to be any evidence of worsening infection which is great news. Electronic Signature(s) Signed: 08/27/2021 2:14:29 PM By: [name] PA-C Entered By: [name] on 08/27/2021 14:14:29 -------------------------------------------------------------------------------- Chemical Cauterization Details Patient Name: Date of Service: Sarah Long, Sarah EL R. 08/26/2021 1:15 PM Medical Record Number: [id number] Patient Account Number: [id number] Date of Birth/Sex: Treating RN: November 02, 1971 (50 y.o. [name] Primary Care Provider: Asencion Noble Other Clinician: Referring Provider: Treating Provider/Extender: Emelda Brothers in Treatment: [phone] Procedure Performed for: Wound #3 Right T Great oe Performed By: Physician [name], PA Post Procedure Diagnosis Same as Pre-procedure Notes using silver nitrate stick Electronic Signature(s) Signed: 08/26/2021 5:43:52 PM By: Baruch Gouty RN, BSN Signed: 08/27/2021 2:21:00 PM By: [name] PA-C Entered By: Baruch Gouty on 08/26/2021 13:55:05 -------------------------------------------------------------------------------- Physical Exam Details Patient Name: Date of Service: Sarah Haring R. 08/26/2021 1:15 PM Medical Record Number: [id number] Patient Account Number: [id number] Date of Birth/Sex: Treating RN: 27-Mar-1971 (50 y.o. [name] Primary Care Provider: Asencion Noble Other Clinician: Referring Provider: Treating Provider/Extender: Emelda Brothers in Treatment: 80 Constitutional Well-nourished and well-hydrated in no acute distress. Respiratory normal breathing without difficulty. Psychiatric this patient is able to make decisions and demonstrates good insight into disease process. Alert and Oriented x 3. pleasant and cooperative. Notes Upon inspection patient's wound bed showed signs of good granulation and epithelization in some degree. She still has some depth to the wound fortunately there is no evidence of active infection systemically although locally she still obviously has some issues going on we will try to get her approved for HBO therapy as well as soon as we get that approved we will get things started. Otherwise I think we are definitely headed in a better direction I think the oral antibiotics is helping. Electronic Signature(s) Signed: 08/27/2021 2:14:58 PM By: [name] PA-C Entered By: [name] on  08/27/2021 14:14:58 -------------------------------------------------------------------------------- Physician Orders Details Patient Name: Date of  Service: Sarah Long, Sarah Long 08/26/2021 1:15 PM Medical Record Number: [id number] Patient Account Number: [id number] Date of Birth/Sex: Treating RN: Sep 30, 1971 (50 y.o. [name] Primary Care Provider: Asencion Noble Other Clinician: Referring Provider: Treating Provider/Extender: Emelda Brothers in Treatment: [phone] Verbal / Phone Orders: No Diagnosis Coding ICD-10 Coding Code Description [phone] Other chronic osteomyelitis, right ankle and foot E11.621 Type 2 diabetes mellitus with foot ulcer L97.512 Non-pressure chronic ulcer of other part of right foot with fat layer exposed E11.43 Type 2 diabetes mellitus with diabetic autonomic (poly)neuropathy I10 Essential (primary) hypertension Z95.0 Presence of cardiac pacemaker Follow-up Appointments ppointment in 1 week. - with Margarita Grizzle Return A Bathing/ Shower/ Hygiene May shower with protection but do not get wound dressing(s) wet. Edema Control - Lymphedema / SCD / Other Elevate legs to the level of the heart or above for 30 minutes daily and/or when sitting, a frequency of: - throughout the day Avoid standing for long periods of time. Additional Orders / Instructions Stop/Decrease Smoking Home Health Wound #3 Right T Great oe Admit to Home Health for wound care. May utilize formulary equivalent dressing for wound treatment orders unless otherwise specified. Dressing changes to be completed by [place] on Monday / Wednesday / Friday except when patient has scheduled visit at [facility]. Hyperbaric Oxygen Therapy Indication: - wagner grade 3 diabetic foot ulcer of right great toe If appropriate for treatment, begin HBOT per protocol: 2.0 ATA for 90 Minutes without A Breaks ir Total Number of Treatments: - 40 One treatments per day (delivered Monday through Friday unless otherwise specified in Special Instructions below): Finger stick Blood Glucose Pre- and Post- HBOT Treatment. Follow Hyperbaric  Oxygen Glycemia Protocol A frin (Oxymetazoline HCL) 0.05% nasal spray - 1 spray in both nostrils daily as needed prior to HBO treatment for difficulty clearing ears Wound Treatment Wound #3 - T Great oe Wound Laterality: Right Prim Dressing: Hydrofera Blue Ready Foam, 4x5 in Every Other Day/15 Days ary Discharge Instructions: Apply to wound bed cut to fit inside wound edges Secondary Dressing: Woven Gauze Sponges 2x2 in Every Other Day/15 Days Discharge Instructions: Apply over primary dressing as directed. Secondary Dressing: Optifoam Non-Adhesive Dressing, 4x4 in Every Other Day/15 Days Discharge Instructions: cut to make foam donut Secured With: Conforming Stretch Gauze Bandage, Sterile 2x75 (in/in) Every Other Day/15 Days Discharge Instructions: Secure with stretch gauze . wrap toes individually GLYCEMIA INTERVENTIONS PROTOCOL PRE-HBO GLYCEMIA INTERVENTIONS ACTION INTERVENTION Obtain pre-HBO capillary blood glucose (ensure 1 physician order is in chart). A. Notify HBO physician and await physician orders. 2 If result is 70 mg/dl or below: B. If the result meets the hospital definition of a critical result, follow hospital policy. A. Give patient an 8 ounce Glucerna Shake, an 8 ounce Ensure, or 8 ounces of a Glucerna/Ensure equivalent dietary supplement*. B. Wait 30 minutes. If result is 71 mg/dl to 130 mg/dl: C. Retest patients capillary blood glucose (CBG). D. If result greater than or equal to 110 mg/dl, proceed with HBO. If result less than 110 mg/dl, notify HBO physician and consider holding HBO. If result is 131 mg/dl to 249 mg/dl: A. Proceed with HBO. A. Notify HBO physician and await physician orders. B. It is recommended to hold HBO and do If result is 250 mg/dl or greater: blood/urine ketone testing. C. If the result meets the hospital definition of a critical result, follow hospital policy. POST-HBO GLYCEMIA INTERVENTIONS  ACTION INTERVENTION Obtain post  HBO capillary blood glucose (ensure 1 physician order is in chart). A. Notify HBO physician and await physician orders. 2 If result is 70 mg/dl or below: B. If the result meets the hospital definition of a critical result, follow hospital policy. A. Give patient an 8 ounce Glucerna Shake, an 8 ounce Ensure, or 8 ounces of a Glucerna/Ensure equivalent dietary supplement*. B. Wait 15 minutes for symptoms of If result is 71 mg/dl to 100 mg/dl: hypoglycemia (i.e. nervousness, anxiety, sweating, chills, clamminess, irritability, confusion, tachycardia or dizziness). C. If patient asymptomatic, discharge patient. If patient symptomatic, repeat capillary blood glucose (CBG) and notify HBO physician. If result is 101 mg/dl to 249 mg/dl: A. Discharge patient. A. Notify HBO physician and await physician orders. B. It is recommended to do blood/urine ketone If result is 250 mg/dl or greater: testing. C. If the result meets the hospital definition of a critical result, follow hospital policy. *Juice or candies are NOT equivalent products. If patient refuses the Glucerna or Ensure, please consult the hospital dietitian for an appropriate substitute. Electronic Signature(s) Signed: 08/26/2021 5:43:52 PM By: Baruch Gouty RN, BSN Signed: 08/27/2021 2:21:00 PM By: [name] PA-C Entered By: Baruch Gouty on 08/26/2021 13:57:26 -------------------------------------------------------------------------------- Problem List Details Patient Name: Date of Service: Sarah Ferrari EL R. 08/26/2021 1:15 PM Medical Record Number: [id number] Patient Account Number: [id number] Date of Birth/Sex: Treating RN: 1970/12/23 (50 y.o. [name] Primary Care Provider: Asencion Noble Other Clinician: Referring Provider: Treating Provider/Extender: Emelda Brothers in Treatment: 13 Active Problems ICD-10 Encounter Code Description Active Date MDM Diagnosis [phone]  Other chronic osteomyelitis, right ankle and foot 08/12/2021 No Yes E11.621 Type 2 diabetes mellitus with foot ulcer 05/27/2021 No Yes L97.512 Non-pressure chronic ulcer of other part of right foot with fat layer exposed 05/27/2021 No Yes E11.43 Type 2 diabetes mellitus with diabetic autonomic (poly)neuropathy 05/27/2021 No Yes I10 Essential (primary) hypertension 05/27/2021 No Yes Z95.0 Presence of cardiac pacemaker 05/27/2021 No Yes Inactive Problems Resolved Problems ICD-10 Code Description Active Date Resolved Date L97.522 Non-pressure chronic ulcer of other part of left foot with fat layer exposed 05/27/2021 05/27/2021 Electronic Signature(s) Signed: 08/26/2021 1:27:15 PM By: [name] PA-C Entered By: [name] on 08/26/2021 13:27:14 -------------------------------------------------------------------------------- Progress Note Details Patient Name: Date of Service: Sarah Haring R. 08/26/2021 1:15 PM Medical Record Number: [id number] Patient Account Number: [id number] Date of Birth/Sex: Treating RN: 01-Dec-1971 (50 y.o. [name] Primary Care Provider: Asencion Noble Other Clinician: Referring Provider: Treating Provider/Extender: Emelda Brothers in Treatment: 13 Subjective Chief Complaint Information obtained from Patient Bilateral toe ulcers History of Present Illness (HPI) 05/27/2021 upon evaluation today patient appears to be doing poorly in regard to her toes on the left and right foot. She has the first and second toe on the left foot as well as the fifth toe. On the right foot is just the first and second toe. With that being said she does not know any injury or otherwise that occurred. She states she just looked down and noticed that it was there and again that was just within the last week or so around 13 June. She does not know of anything that happened she has not gone barefoot she tells me that she wear shoes and again has not been  outside barefoot at any point in time. Fortunately there does not appear to be any signs of active infection at this time. No fevers,  chills, nausea, vomiting, or diarrhea. With that being said she is on Keflex she was also on doxycycline previous. She does have a strong history of diabetes mellitus type 2 which is fairly uncontrolled she has not had her insulin for quite a while here it seems. I do not have the final A1c yet although her glucose in the hospital last week was 333. Nonetheless I do believe that this coupled with her smoking is going to limit her healing to some degree nonetheless we need to get into trying to get her into a better place with regard to this. Her diabetes again is a big deal as far as healing is concerned right now in my opinion being that it still out of control. She also has a cardiac pacemaker she does not really know exactly what happened she tells me she went into DKA was in the hospital and then subsequently had a pacemaker following. 06/03/2021 upon evaluation today patient appears to be doing well with regard to her wounds on the toes. In general I think that she is doing excellent in my opinion. I do not see any signs of active infection which is great news. No fevers, chills, nausea, vomiting, or diarrhea. Unfortunately the alginate has been sticking to the wound beds. 06/10/2021 upon evaluation today patient appears to be doing well with regard to her wounds currently. Fortunately there does not appear to be any signs of infection. Still without making a tremendous amount of improvement but also things are getting significantly worse either which is also good news. Fortunately there is no evidence of infection at this point. The patient did have her appointment with Dr. Sophronia Simas. He has recommended an arteriogram. She will also be having a vascular ultrasound for blood flow coming up in the next few days. 08/12/2021 upon evaluation today patient appears to be doing  poorly in regard to her right great toe. This is daily wound left at this point her second toe is healed from last time I saw her and the left leg wound is actually gone as a result of the patient having what was called necrotizing fasciitis and having a below-knee amputation. In regards to the right great toe she did have an MRI this MRI did show that she had evidence of edema with low T1 signal of the distal phalanx and partially within the proximal phalanx with adjacent soft tissue ulcer. This was compatible with early osteomyelitis or reactive marrow change. Based on what I am seeing I think the probability of early osteomyelitis is most likely. Fortunately there does not appear to be any signs of systemic infection which is great news. With all that being said I think the patient would benefit from the possibility of proceeding with HBO therapy. 08/19/2021 upon evaluation today patient appears to be doing well currently in regard to her wound all things considered though she still has a significant amount of drainage noted as well. Subsequently she is tolerating the dressing changes without complication. The Hydrofera Blue does seem to be beneficial up to this point. We are still working on the approval process for HBO therapy for her. 08/26/2021 upon evaluation today patient appears to be doing better in regard to her toe ulcer. I am actually seeing signs of improvement which is great news and overall very pleased in that regard. Fortunately there does not appear to be any evidence of worsening infection which is great news. Objective Constitutional Well-nourished and well-hydrated in no acute distress. Vitals Time Taken:  1:09 PM, Height: 64 in, Weight: 200 lbs, BMI: 34.3, Temperature: 98.1 F, Pulse: 86 bpm, Respiratory Rate: 18 breaths/min, Blood Pressure: 132/84 mmHg, Capillary Blood Glucose: 167 mg/dl. Respiratory normal breathing without difficulty. Psychiatric this patient is able to  make decisions and demonstrates good insight into disease process. Alert and Oriented x 3. pleasant and cooperative. General Notes: Upon inspection patient's wound bed showed signs of good granulation and epithelization in some degree. She still has some depth to the wound fortunately there is no evidence of active infection systemically although locally she still obviously has some issues going on we will try to get her approved for HBO therapy as well as soon as we get that approved we will get things started. Otherwise I think we are definitely headed in a better direction I think the oral antibiotics is helping. Integumentary (Hair, Skin) Wound #3 status is Open. Original cause of wound was Other Lesion. The date acquired was: 05/18/2021. The wound has been in treatment 13 weeks. The wound is located on the Right T Great. The wound measures 2.3cm length x 1cm width x 0.5cm depth; 1.806cm^2 area and 0.903cm^3 volume. There is Fat oe Layer (Subcutaneous Tissue) exposed. There is no tunneling noted, however, there is undermining starting at 6:00 and ending at 7:00 with a maximum distance of 0.3cm. There is a medium amount of serosanguineous drainage noted. The wound margin is well defined and not attached to the wound base. There is large (67-100%) red, friable, hyper - granulation within the wound bed. There is no necrotic tissue within the wound bed. Assessment Active Problems ICD-10 Other chronic osteomyelitis, right ankle and foot Type 2 diabetes mellitus with foot ulcer Non-pressure chronic ulcer of other part of right foot with fat layer exposed Type 2 diabetes mellitus with diabetic autonomic (poly)neuropathy Essential (primary) hypertension Presence of cardiac pacemaker Procedures Wound #3 Pre-procedure diagnosis of Wound #3 is a Diabetic Wound/Ulcer of the Lower Extremity located on the Right T Great . An Chemical Cauterization procedure oe was performed by [name],  PA. Post procedure Diagnosis Wound #3: Same as Pre-Procedure Notes: using silver nitrate stick Plan Follow-up Appointments: Return Appointment in 1 week. - with Glynn Octave Shower/ Hygiene: May shower with protection but do not get wound dressing(s) wet. Edema Control - Lymphedema / SCD / Other: Elevate legs to the level of the heart or above for 30 minutes daily and/or when sitting, a frequency of: - throughout the day Avoid standing for long periods of time. Additional Orders / Instructions: Stop/Decrease Smoking Home Health: Wound #3 Right T Great: oe Admit to Home Health for wound care. May utilize formulary equivalent dressing for wound treatment orders unless otherwise specified. Dressing changes to be completed by [place] on Monday / Wednesday / Friday except when patient has scheduled visit at [facility]. Hyperbaric Oxygen Therapy: Indication: - wagner grade 3 diabetic foot ulcer of right great toe If appropriate for treatment, begin HBOT per protocol: 2.0 ATA for 90 Minutes without Air Breaks T Number of Treatments: - 40 otal One treatments per day (delivered Monday through Friday unless otherwise specified in Special Instructions below): Finger stick Blood Glucose Pre- and Post- HBOT Treatment. Follow Hyperbaric Oxygen Glycemia Protocol Afrin (Oxymetazoline HCL) 0.05% nasal spray - 1 spray in both nostrils daily as needed prior to HBO treatment for difficulty clearing ears WOUND #3: - T Great Wound Laterality: Right oe Prim Dressing: Hydrofera Blue Ready Foam, 4x5 in Every Other Day/15 Days ary Discharge  Instructions: Apply to wound bed cut to fit inside wound edges Secondary Dressing: Woven Gauze Sponges 2x2 in Every Other Day/15 Days Discharge Instructions: Apply over primary dressing as directed. Secondary Dressing: Optifoam Non-Adhesive Dressing, 4x4 in Every Other Day/15 Days Discharge Instructions: cut to make foam donut Secured With: Conforming  Stretch Gauze Bandage, Sterile 2x75 (in/in) Every Other Day/15 Days Discharge Instructions: Secure with stretch gauze . wrap toes individually 1. Would recommend currently that we going continue with wound care measures as before with [facility] I think this is still the best option. 2. I am also can recommend that we have the patient continue to monitor for any signs of worsening or infection such as increased fevers, chills, nausea, vomiting, or diarrhea. If any this occurs she should let me know as soon as possible. We will see patient back for reevaluation in 1 week here in the clinic. If anything worsens or changes patient will contact our office for additional recommendations. Electronic Signature(s) Signed: 08/27/2021 2:15:28 PM By: [name] PA-C Entered By: [name] on 08/27/2021 14:15:28 -------------------------------------------------------------------------------- SuperBill Details Patient Name: Date of Service: Sarah Mustard. 08/26/2021 Medical Record Number: [id number] Patient Account Number: [id number] Date of Birth/Sex: Treating RN: February 21, 1971 (50 y.o. [name], [name] Primary Care Provider: Asencion Noble Other Clinician: Referring Provider: Treating Provider/Extender: Emelda Brothers in Treatment: 13 Diagnosis Coding ICD-10 Codes Code Description [phone] Other chronic osteomyelitis, right ankle and foot E11.621 Type 2 diabetes mellitus with foot ulcer L97.512 Non-pressure chronic ulcer of other part of right foot with fat layer exposed E11.43 Type 2 diabetes mellitus with diabetic autonomic (poly)neuropathy I10 Essential (primary) hypertension Z95.0 Presence of cardiac pacemaker Facility Procedures CPT4 Code: [id number] Description: 70017 - CHEM CAUT GRANULATION TISS ICD-10 Diagnosis Description L97.512 Non-pressure chronic ulcer of other part of right foot with fat layer exposed Modifier: Quantity: 1 Physician Procedures :  CPT4 Code Description Modifier [id number] - WC PHYS CHEM CAUT GRAN TISSUE ICD-10 Diagnosis Description L97.512 Non-pressure chronic ulcer of other part of right foot with fat layer exposed Quantity: 1 Electronic Signature(s) Signed: 08/27/2021 2:15:42 PM By: [name] PA-C Previous Signature: 08/26/2021 5:43:52 PM Version By: Baruch Gouty RN, BSN Entered By: [name] on 08/27/2021 14:15:41

## 2021-08-26 NOTE — Telephone Encounter (Signed)
I have not been able to complete the servant center application and I will be on PAL until next Tuesday. Can you help me with this please?

## 2021-08-27 NOTE — Telephone Encounter (Signed)
Yes.I will follow up with the patient today.

## 2021-08-31 ENCOUNTER — Telehealth: Payer: Self-pay

## 2021-08-31 NOTE — Telephone Encounter (Signed)
Completed [place] Referral with the patient by phone. Verified patients name and date of birth. Patient was able to provide information by phone and gave verbal consent for case manager to submit the referral for disability assistance.   Referral placed on 08/31/2021 by email.

## 2021-09-01 ENCOUNTER — Encounter: Payer: Self-pay | Admitting: Orthopedic Surgery

## 2021-09-01 ENCOUNTER — Other Ambulatory Visit: Payer: Self-pay

## 2021-09-01 ENCOUNTER — Ambulatory Visit (INDEPENDENT_AMBULATORY_CARE_PROVIDER_SITE_OTHER): Payer: Medicaid Other | Admitting: Orthopedic Surgery

## 2021-09-01 DIAGNOSIS — M86171 Other acute osteomyelitis, right ankle and foot: Secondary | ICD-10-CM

## 2021-09-01 DIAGNOSIS — M869 Osteomyelitis, unspecified: Secondary | ICD-10-CM

## 2021-09-01 NOTE — Progress Notes (Signed)
Office Visit Note   [name]           Date of Birth: [birth year]           MRN: [id number] Visit Date: 09/01/2021              Requested by: [name], MD [address],  [place] [address] PCP: [name], MD  Chief Complaint  Patient presents with   Left Leg - Routine Post Op    06/27/21 left BKA       HPI: Patient is a 50 year old woman who was seen for 2 separate issues.  #1 she is 2 months status post a left transtibial amputation she has an appointment for [facility] on Friday for casting for prosthesis.  She states she is lost a lot of residual volume in her leg.  #2 she has a chronic ulcer over the medial border of the right great toe she has had an MRI scan and has been on doxycycline.  She is most recently transition to [place] and states that she is scheduled for hyperbaric treatment at the wound center with [facility].  Assessment & Plan: Visit Diagnoses:  1. Osteomyelitis of great toe of right foot ([place])     Plan: With the osteomyelitis involving the entire tuft of the great toe as well as part of the proximal phalanx have recommended amputation of the great toe through the MTP joint.  We will plan on surgery on Friday as outpatient surgery risk and benefits were discussed including importance of minimizing weightbearing on the forefoot to maximize chance of the wound healing.  We reviewed the importance of glucose control patient states she has recently stopped smoking and drinking.  Follow-Up Instructions: Return in about 1 week (around 09/08/2021).   Ortho Exam  Patient is alert, oriented, no adenopathy, well-dressed, normal affect, normal respiratory effort.  Patient's calf is 31 cm in circumference. Examination patient has a well-healed left transtibial amputation she was placed in a size large short stump shrinker.  On the right foot she has a palpable dorsalis pedis pulse the Doppler was used and she has a strong triphasic dorsalis  pedis pulse and a biphasic posterior tibial pulse.  Her ankle-brachial indices have shown calcified vessels but did show triphasic dorsalis pedis and posterior tibial pulses.  She has a necrotic ulcer with purulent drainage over the medial border of the right great toe.  The MRI scan does show osteomyelitis involving the entire tuft of the toe as well as osteomyelitis of the proximal phalanx.  Patient's most recent hemoglobin A1c is 13.2.  Imaging: No results found. No images are attached to the encounter.  Labs: Lab Results  Component Value Date   HGBA1C 13.2 (H) 07/02/2021   HGBA1C 13.6 (A) 06/18/2021   HGBA1C 8.4 (A) 05/15/2020   LABURIC 4.1 10/21/2016   LABURIC 4.2 11/20/2010   REPTSTATUS 08/24/2021 FINAL 08/19/2021   GRAMSTAIN NO WBC SEEN NO ORGANISMS SEEN  08/19/2021   CULT  08/19/2021    RARE PSEUDOMONAS AERUGINOSA WITHIN MIXED ORGANISMS NO ANAEROBES ISOLATED Performed at [facility], [place] [address] [place], [place] [address]    LABORGA PSEUDOMONAS AERUGINOSA 08/19/2021     Lab Results  Component Value Date   ALBUMIN 3.4 (L) 07/30/2021   ALBUMIN 2.2 (L) 07/13/2021   ALBUMIN 1.5 (L) 07/02/2021    Lab Results  Component Value Date   MG 1.7 06/28/2021   MG 1.8 06/27/2021  MG 1.7 06/27/2021   Lab Results  Component Value Date   VD25OH 24.1 (L) 03/11/2020   VD25OH <4.20 (L) 02/15/2020    No results found for: PREALBUMIN CBC EXTENDED Latest Ref Rng & Units 07/30/2021 07/06/2021 07/02/2021  WBC 3.4 - 10.8 x10E3/uL 7.9 10.0 12.5(H)  RBC 3.77 - 5.28 x10E6/uL 3.11(L) 2.95(L) 2.82(L)  HGB 11.1 - 15.9 g/dL 8.3(L) 8.1(L) 7.6(L)  HCT 34.0 - 46.6 % 26.4(L) 26.6(L) 25.6(L)  PLT 150 - 450 x10E3/uL 342 332 300  NEUTROABS 1.4 - 7.0 x10E3/uL 5.8 - 9.6(H)  LYMPHSABS 0.7 - 3.1 x10E3/uL 1.6 - 2.0     There is no height or weight on file to calculate BMI.  Orders:  No orders of the defined types were placed in this encounter.  No orders of the defined types were  placed in this encounter.    Procedures: No procedures performed  Clinical Data: No additional findings.  ROS:  All other systems negative, except as noted in the HPI. Review of Systems  Objective: Vital Signs: There were no vitals taken for this visit.  Specialty Comments:  No specialty comments available.  PMFS History: Patient Active Problem List   Diagnosis Date Noted   Left below-knee amputee ([place]) 07/01/2021   Protein-calorie malnutrition ([place]) 06/26/2021   Hypoalbuminemia due to protein-calorie malnutrition ([place]) 06/26/2021   Normocytic anemia 06/26/2021   Heart failure with mid-range ejection fraction ([place]) 06/26/2021   Diabetic nephropathy with proteinuria ([place]) 06/26/2021   Diabetic foot ulcer ([place]) 05/26/2021   Bacterial vaginosis 07/17/2020   Dental caries 04/01/2020   Cracked tooth 04/01/2020   Alcohol use 03/11/2020   Vitamin D deficiency 03/11/2020   Prolonged QT interval 02/26/2020   Chronic combined systolic and diastolic CHF (congestive heart failure) ([place]) 02/26/2020   Heart block AV complete ([place]) 02/11/2020   Exocrine pancreatic insufficiency    Dysmenorrhea 10/02/2019   DM type 2 with diabetic peripheral neuropathy ([place]) 05/30/2019   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 block) 05/29/2019   Bilateral bunions 05/29/2019   Diabetes type 2, uncontrolled ([place]) 10/21/2016   Smoking 11/26/2013   Hypercholesteremia 02/02/2007   Bipolar 1 disorder ([place]) 02/02/2007   HYPERTENSION, BENIGN SYSTEMIC 02/02/2007   Past Medical History:  Diagnosis Date   Abnormal Pap smear 1998   Abnormal vaginal bleeding 12/19/2011   Acute urinary retention 06/26/2021   Bacterial infection    Bipolar 1 disorder ([place])    Blister of second toe of left foot 11/21/2016   Candida vaginitis 07/2007   Depression    recently added wellbutrin-has not taken yet for bipolar   Diabetes in pregnancy    Diabetes mellitus    nph 20U qam and qpm, regular  with meals   Diabetic ketoacidosis without coma associated with type 2 diabetes mellitus ([place])    Fibroid    Galactorrhea of right breast 2008   H/O amenorrhea 07/2007   H/O dizziness 10/14/2011   H/O dysmenorrhea 2010   H/O menorrhagia 10/14/2011   H/O varicella    Headache(784.0)    Heavy vaginal bleeding due to contraceptive injection use 10/12/2011   Depo Provera   Herpes    HSV-2 infection 01/03/2009   Hx: UTI (urinary tract infection) 2009   Hypertension    on aldomet   Increased BMI 2010   Irregular uterine bleeding 04/04/2012   Pt has mirena    Obesity 10/14/2011   Oligomenorrhea 07/2007   Pelvic pain in female    Presence of permanent  cardiac pacemaker    Preterm labor    Trichomonas    Yeast infection     Family History  Problem Relation Age of Onset   Hypertension Mother    Diabetes Mother    Heart disease Mother    Hypertension Father    Diabetes Father    Heart disease Father    Stroke Maternal Grandfather    Other Neg Hx     Past Surgical History:  Procedure Laterality Date   ABDOMINAL AORTOGRAM W/LOWER EXTREMITY N/A 06/17/2021   Procedure: ABDOMINAL AORTOGRAM W/LOWER EXTREMITY;  Surgeon: Wellington Hampshire, MD;  Location: [facility];  Service: Cardiovascular;  Laterality: N/A;   AMPUTATION Left 06/27/2021   Procedure: AMPUTATION BELOW KNEE;  Surgeon: Newt Minion, MD;  Location: [facility];  Service: Orthopedics;  Laterality: Left;   CESAREAN SECTION  1991   LEFT HEART CATH AND CORONARY ANGIOGRAPHY N/A 02/10/2020   Procedure: LEFT HEART CATH AND CORONARY ANGIOGRAPHY;  Surgeon: Troy Sine, MD;  Location: [facility];  Service: Cardiovascular;  Laterality: N/A;   PACEMAKER IMPLANT N/A 02/13/2020   Procedure: PACEMAKER IMPLANT;  Surgeon: Constance Haw, MD;  Location: [facility];  Service: Cardiovascular;  Laterality: N/A;   TEMPORARY PACEMAKER N/A 02/10/2020   Procedure: TEMPORARY PACEMAKER;  Surgeon: Troy Sine, MD;   Location: [facility];  Service: Cardiovascular;  Laterality: N/A;   Social History   Occupational History   Not on file  Tobacco Use   Smoking status: Former    Packs/day: 0.50    Years: 20.00    Pack years: 10.00    Types: Cigarettes   Smokeless tobacco: Never  Vaping Use   Vaping Use: Never used  Substance and Sexual Activity   Alcohol use: No   Drug use: No   Sexual activity: Yes    Birth control/protection: None

## 2021-09-01 NOTE — Progress Notes (Addendum)
MANDISA, Sarah Long ([id number]) Visit Report for 08/26/2021 Arrival Information Details Patient Name: Date of Service: Sarah Long, VOLCY 08/26/2021 1:15 PM Medical Record Number: [id number] Patient Account Number: [id number] Date of Birth/Sex: Treating RN: 05-26-1971 (50 y.o. [name] Primary Care Lorelei Heikkila: Asencion Noble Other Clinician: Referring Karron Alvizo: Treating Therisa Mennella/Extender: Emelda Brothers in Treatment: 74 Visit Information History Since Last Visit Added or deleted any medications: No Patient Arrived: Wheel Chair Any new allergies or adverse reactions: No Arrival Time: 13:08 Had a fall or experienced change in No Accompanied By: son activities of daily living that may affect Transfer Assistance: None risk of falls: Patient Identification Verified: Yes Signs or symptoms of abuse/neglect since last visito No Secondary Verification Process Completed: Yes Hospitalized since last visit: No Patient Requires Transmission-Based Precautions: No Implantable device outside of the clinic excluding No Patient Has Alerts: Yes cellular tissue based products placed in the center since last visit: Has Dressing in Place as Prescribed: Yes Pain Present Now: No Electronic Signature(s) Signed: 09/01/2021 12:59:59 PM By: Sandre Kitty Entered By: Sandre Kitty on 08/26/2021 13:09:31 -------------------------------------------------------------------------------- Encounter Discharge Information Details Patient Name: Date of Service: Sarah Haring R. 08/26/2021 1:15 PM Medical Record Number: [id number] Patient Account Number: [id number] Date of Birth/Sex: Treating RN: 12/17/1970 (50 y.o. [name] Primary Care Tinie Mcgloin: Asencion Noble Other Clinician: Referring Almena Hokenson: Treating Earland Reish/Extender: Emelda Brothers in Treatment: 13 Encounter Discharge Information Items Discharge Condition: Stable Ambulatory Status:  Wheelchair Discharge Destination: Home Transportation: Private Auto Accompanied By: son Schedule Follow-up Appointment: Yes Clinical Summary of Care: Patient Declined Electronic Signature(s) Signed: 08/26/2021 5:43:52 PM By: Baruch Gouty RN, BSN Entered By: Baruch Gouty on 08/26/2021 14:04:20 -------------------------------------------------------------------------------- Lower Extremity Assessment Details Patient Name: Date of Service: Sarah Long, Sarah EL R. 08/26/2021 1:15 PM Medical Record Number: [id number] Patient Account Number: [id number] Date of Birth/Sex: Treating RN: [birth year] (50 y.o. [name] Primary Care Marjory Meints: Asencion Noble Other Clinician: Referring Deicy Rusk: Treating Hinton Luellen/Extender: Emelda Brothers in Treatment: 13 Edema Assessment Assessed: Shirlyn Goltz: No] Patrice Paradise: No] Edema: [Left: N] [Right: o] Calf Left: Right: Point of Measurement: 30 cm From Medial Instep 33 cm Ankle Left: Right: Point of Measurement: 9 cm From Medial Instep 22 cm Vascular Assessment Pulses: Dorsalis Pedis Palpable: [Right:Yes] Electronic Signature(s) Signed: 08/26/2021 5:43:52 PM By: Baruch Gouty RN, BSN Entered By: Baruch Gouty on 08/26/2021 13:27:57 -------------------------------------------------------------------------------- Multi-Disciplinary Care Plan Details Patient Name: Date of Service: Sarah Ferrari EL R. 08/26/2021 1:15 PM Medical Record Number: [id number] Patient Account Number: [id number] Date of Birth/Sex: Treating RN: 11/03/1971 (50 y.o. [name] Primary Care Kaidan Harpster: Asencion Noble Other Clinician: Referring Karisma Meiser: Treating Valory Wetherby/Extender: Emelda Brothers in Treatment: [place] reviewed with physician Active Inactive Electronic Signature(s) Signed: 09/03/2021 6:02:00 PM By: Baruch Gouty RN, BSN Previous Signature: 08/26/2021 5:43:52 PM Version By: Baruch Gouty RN, BSN Entered By: Baruch Gouty on 09/02/2021 12:58:17 -------------------------------------------------------------------------------- Pain Assessment Details Patient Name: Date of Service: Sarah Haring R. 08/26/2021 1:15 PM Medical Record Number: [id number] Patient Account Number: [id number] Date of Birth/Sex: Treating RN: [birth year] (50 y.o. [name] Primary Care Rockland Kotarski: Asencion Noble Other Clinician: Referring Tishanna Dunford: Treating Rai Sinagra/Extender: Emelda Brothers in Treatment: 13 Active Problems Location of Pain Severity and Description of Pain Patient Has Paino No Site Locations Pain Management and Medication Current Pain Management: Electronic Signature(s) Signed: 08/26/2021 5:43:52 PM By: Baruch Gouty RN, BSN Signed: 09/01/2021 12:59:59 PM  By: Sandre Kitty Entered By: Sandre Kitty on 08/26/2021 13:10:06 -------------------------------------------------------------------------------- Patient/Caregiver Education Details Patient Name: Date of Service: Sarah Long 9/21/2022andnbsp1:15 PM Medical Record Number: [id number] Patient Account Number: [id number] Date of Birth/Gender: Treating RN: 29-Oct-1971 (50 y.o. [name] Primary Care Physician: Asencion Noble Other Clinician: Referring Physician: Treating Physician/Extender: Emelda Brothers in Treatment: 13 Education Assessment Education Provided To: Patient Education Topics Provided Offloading: Methods: Explain/Verbal Responses: Reinforcements needed, State content correctly Wound/Skin Impairment: Methods: Explain/Verbal Responses: Reinforcements needed, State content correctly Electronic Signature(s) Signed: 08/26/2021 5:43:52 PM By: Baruch Gouty RN, BSN Entered By: Baruch Gouty on 08/26/2021 13:53:59 -------------------------------------------------------------------------------- Wound Assessment Details Patient  Name: Date of Service: Sarah Haring R. 08/26/2021 1:15 PM Medical Record Number: [id number] Patient Account Number: [id number] Date of Birth/Sex: Treating RN: Jul 09, 1971 (50 y.o. Helene Shoe, Tammi Klippel Primary Care Aubrielle Stroud: Asencion Noble Other Clinician: Referring Hamna Asa: Treating Alyia Lacerte/Extender: Emelda Brothers in Treatment: 13 Wound Status Wound Number: 3 Primary Etiology: Diabetic Wound/Ulcer of the Lower Extremity Wound Location: Right T Great oe Wound Status: Open Wounding Event: Other Lesion Comorbid History: Glaucoma, Hypertension, Type II Diabetes, Neuropathy Date Acquired: 05/18/2021 Weeks Of Treatment: 13 Clustered Wound: No Photos Wound Measurements Length: (cm) 2.3 Width: (cm) 1 Depth: (cm) 0.5 Area: (cm) 1.806 Volume: (cm) 0.903 % Reduction in Area: 89.5% % Reduction in Volume: 73.9% Epithelialization: Small (1-33%) Tunneling: No Undermining: Yes Starting Position (o'clock): 6 Ending Position (o'clock): 7 Maximum Distance: (cm) 0.3 Wound Description Classification: Grade 3 Wound Margin: Well defined, not attached Exudate Amount: Medium Exudate Type: Serosanguineous Exudate Color: red, brown Foul Odor After Cleansing: No Slough/Fibrino Yes Wound Bed Granulation Amount: Large (67-100%) Exposed Structure Granulation Quality: Red, Hyper-granulation, Friable Fascia Exposed: No Necrotic Amount: None Present (0%) Fat Layer (Subcutaneous Tissue) Exposed: Yes Tendon Exposed: No Muscle Exposed: No Joint Exposed: No Bone Exposed: No Electronic Signature(s) Signed: 08/26/2021 5:43:52 PM By: Baruch Gouty RN, BSN Signed: 08/26/2021 5:58:14 PM By: Deon Pilling RN, BSN Signed: 08/26/2021 5:58:14 PM By: Deon Pilling RN, BSN Entered By: Baruch Gouty on 08/26/2021 13:28:35 -------------------------------------------------------------------------------- [place] Details Patient Name: Date of Service: Sarah Ferrari EL R. 08/26/2021 1:15  PM Medical Record Number: [id number] Patient Account Number: [id number] Date of Birth/Sex: Treating RN: 11-23-1971 (50 y.o. [name] Primary Care Marlene Pfluger: Asencion Noble Other Clinician: Referring [place] Flight: Treating Kinlee Garrison/Extender: Emelda Brothers in Treatment: 13 Vital Signs Time Taken: 13:09 Temperature (F): 98.1 Height (in): 64 Pulse (bpm): 86 Weight (lbs): 200 Respiratory Rate (breaths/min): 18 Body Mass Index (BMI): 34.3 Blood Pressure (mmHg): 132/84 Capillary Blood Glucose (mg/dl): 167 Reference Range: 80 - 120 mg / dl Electronic Signature(s) Signed: 09/01/2021 12:59:59 PM By: Sandre Kitty Entered By: Sandre Kitty on 08/26/2021 13:09:59

## 2021-09-02 ENCOUNTER — Encounter (HOSPITAL_BASED_OUTPATIENT_CLINIC_OR_DEPARTMENT_OTHER): Payer: Medicaid Other | Admitting: Physician Assistant

## 2021-09-03 ENCOUNTER — Other Ambulatory Visit: Payer: Self-pay

## 2021-09-03 ENCOUNTER — Encounter (HOSPITAL_COMMUNITY): Payer: Self-pay | Admitting: Orthopedic Surgery

## 2021-09-03 ENCOUNTER — Other Ambulatory Visit: Payer: Self-pay | Admitting: Physician Assistant

## 2021-09-03 ENCOUNTER — Encounter: Payer: Self-pay | Admitting: Cardiology

## 2021-09-03 NOTE — Progress Notes (Signed)
Ms Sarah Long denies chest pain or shortness of breath. Patient denies having any s/s of Covid in her household.  Patient denies any known exposure to Covid.   [name] has type II diabetes, patient reports that she checks CBG 1-4 times a day.  "I forget it a lot." In the patient two weeks CBGs have ran 162-262.  I instructed Ms Sarah Long to take 1/2 of Lantus tonight.  I instructed patient to check CBG after awaking and every 2 hours until arrival  to the hospital. I instructed Ms Sarah Long that if CBG is less than 70 to take 4 Glucose Tablets or 1 tube of Glucose Gel or 1/2 cup of a clear juice. Recheck CBG in 15 minutes if CBG is not over 70 call, pre- op desk at [phone] for further instructions.   I instructed patient to shower with antibiotic soap, if it is available.  Dry off with a clean towel. Do not put lotion, powder, cologne or deodorant or makeup.No jewelry or piercings. Men may shave their face and neck. Woman should not shave. No nail polish, artificial or acrylic nails. Wear clean clothes, brush your teeth. Glasses, contact lens,dentures or partials may not be worn in the OR. If you need to wear them, please bring a case for glasses, do not wear contacts or bring a case, the hospital does not have contact cases, dentures or partials will have to be removed , make sure they are clean, we will provide a denture cup to put them in. You will need some one to drive you home and a responsible person over the age of 85 to stay with you for the first 24 hours after surgery.   Ms Sarah Long did not realize that she cannot drive home.I also told patient that she has to have someone over 18 to stay with her for the first 24 hours after. I told Ms Sarah Long that [facility] can pick her up and drive her home (her daughter does not drive).  I told patient that I will set that up, they will call her today and tell her what time she will picked up. I told patient  that she must confirm the ride before 5 pm or the  ride will be cancelled. I gave Ms Sarah Long the phone

## 2021-09-03 NOTE — Progress Notes (Signed)
[facility] DEVICE PROGRAMMING  Patient Information: Name:  Sarah Long  DOB:  11/27/1971  MRN:  [id number]    Planned Procedure: Amputation right great toe  Surgeon:  Dr Sharol Given  Date of Procedure:  09/04/21  Cautery will be used.  Position during surgery:  supine   Please send documentation back to:  [facility] (Fax # [phone])   Device Information:  Clinic EP Physician:  Allegra Lai, MD   Device Type:  Pacemaker Manufacturer and Phone #:  [employer]: [phone] Pacemaker Dependent?:  Yes.   Date of Last Device Check: 08/31/21 (remote) Normal Device Function?:  Yes.    Electrophysiologist's Recommendations:  Have magnet available. Provide continuous ECG monitoring when magnet is used or reprogramming is to be performed.  Procedure should not interfere with device function.  No device programming or magnet placement needed.  Per Device Clinic Standing Orders, Simone Curia, RN  1:32 PM 09/03/2021

## 2021-09-04 ENCOUNTER — Ambulatory Visit (HOSPITAL_COMMUNITY): Payer: Medicaid Other | Admitting: Certified Registered Nurse Anesthetist

## 2021-09-04 ENCOUNTER — Ambulatory Visit (HOSPITAL_COMMUNITY)
Admission: RE | Admit: 2021-09-04 | Discharge: 2021-09-04 | Disposition: A | Discharge status: Home / Self Care | Payer: Medicaid Other | Source: Ambulatory Visit | Attending: Orthopedic Surgery | Admitting: Orthopedic Surgery

## 2021-09-04 ENCOUNTER — Encounter (HOSPITAL_COMMUNITY)
Admission: RE | Disposition: A | Discharge status: Home / Self Care | Payer: Self-pay | Source: Ambulatory Visit | Attending: Orthopedic Surgery

## 2021-09-04 ENCOUNTER — Other Ambulatory Visit (HOSPITAL_COMMUNITY): Payer: Self-pay

## 2021-09-04 ENCOUNTER — Encounter (HOSPITAL_COMMUNITY): Payer: Self-pay | Admitting: Orthopedic Surgery

## 2021-09-04 DIAGNOSIS — Z882 Allergy status to sulfonamides status: Secondary | ICD-10-CM | POA: Diagnosis not present

## 2021-09-04 DIAGNOSIS — E669 Obesity, unspecified: Secondary | ICD-10-CM | POA: Insufficient documentation

## 2021-09-04 DIAGNOSIS — I10 Essential (primary) hypertension: Secondary | ICD-10-CM | POA: Diagnosis not present

## 2021-09-04 DIAGNOSIS — M869 Osteomyelitis, unspecified: Secondary | ICD-10-CM | POA: Insufficient documentation

## 2021-09-04 DIAGNOSIS — K219 Gastro-esophageal reflux disease without esophagitis: Secondary | ICD-10-CM | POA: Insufficient documentation

## 2021-09-04 DIAGNOSIS — M86171 Other acute osteomyelitis, right ankle and foot: Secondary | ICD-10-CM

## 2021-09-04 DIAGNOSIS — Z833 Family history of diabetes mellitus: Secondary | ICD-10-CM | POA: Insufficient documentation

## 2021-09-04 DIAGNOSIS — E11621 Type 2 diabetes mellitus with foot ulcer: Secondary | ICD-10-CM | POA: Diagnosis not present

## 2021-09-04 DIAGNOSIS — Z950 Presence of cardiac pacemaker: Secondary | ICD-10-CM | POA: Insufficient documentation

## 2021-09-04 DIAGNOSIS — F319 Bipolar disorder, unspecified: Secondary | ICD-10-CM | POA: Diagnosis not present

## 2021-09-04 DIAGNOSIS — Z87891 Personal history of nicotine dependence: Secondary | ICD-10-CM | POA: Insufficient documentation

## 2021-09-04 DIAGNOSIS — Z8249 Family history of ischemic heart disease and other diseases of the circulatory system: Secondary | ICD-10-CM | POA: Insufficient documentation

## 2021-09-04 DIAGNOSIS — Z89421 Acquired absence of other right toe(s): Secondary | ICD-10-CM

## 2021-09-04 DIAGNOSIS — M868X7 Other osteomyelitis, ankle and foot: Secondary | ICD-10-CM | POA: Diagnosis present

## 2021-09-04 HISTORY — DX: Unspecified osteoarthritis, unspecified site: M19.90

## 2021-09-04 HISTORY — PX: AMPUTATION: SHX166

## 2021-09-04 HISTORY — DX: Gastro-esophageal reflux disease without esophagitis: K21.9

## 2021-09-04 LAB — GLUCOSE, CAPILLARY
Glucose-Capillary: 176 mg/dL — ABNORMAL HIGH (ref 70–99)
Glucose-Capillary: 134 mg/dL — ABNORMAL HIGH (ref 70–99)
Glucose-Capillary: 148 mg/dL — ABNORMAL HIGH (ref 70–99)

## 2021-09-04 LAB — BASIC METABOLIC PANEL
Anion gap: 8 (ref 5–15)
BUN: 23 mg/dL — ABNORMAL HIGH (ref 6–20)
CO2: 20 mmol/L — ABNORMAL LOW (ref 22–32)
Calcium: 9 mg/dL (ref 8.9–10.3)
Chloride: 108 mmol/L (ref 98–111)
Creatinine, Ser: 1.02 mg/dL — ABNORMAL HIGH (ref 0.44–1.00)
GFR, Estimated: >60 mL/min (ref >60)
Glucose, Bld: 185 mg/dL — ABNORMAL HIGH (ref 70–99)
Potassium: 4.6 mmol/L (ref 3.5–5.1)
Sodium: 136 mmol/L (ref 135–145)

## 2021-09-04 LAB — CBC
HCT: 31.7 % — ABNORMAL LOW (ref 36.0–46.0)
Hemoglobin: 9.9 g/dL — ABNORMAL LOW (ref 12.0–15.0)
MCH: 26.8 pg (ref 26.0–34.0)
MCHC: 31.2 g/dL (ref 30.0–36.0)
MCV: 85.9 fL (ref 80.0–100.0)
Platelets: 224 10*3/uL (ref 150–400)
RBC: 3.69 MIL/uL — ABNORMAL LOW (ref 3.87–5.11)
RDW: 15.4 % (ref 11.5–15.5)
WBC: 7.5 10*3/uL (ref 4.0–10.5)
nRBC: 0 % (ref 0.0–0.2)

## 2021-09-04 LAB — POCT PREGNANCY, URINE: Preg Test, Ur: NEGATIVE

## 2021-09-04 SURGERY — AMPUTATION DIGIT
Anesthesia: Monitor Anesthesia Care | Laterality: Right

## 2021-09-04 MED ORDER — DEXAMETHASONE SODIUM PHOSPHATE 10 MG/ML IJ SOLN
INTRAMUSCULAR | Status: DC | PRN
  Administered 2021-09-04: 5 mg

## 2021-09-04 MED ORDER — HYDROCODONE-ACETAMINOPHEN 5-325 MG PO TABS
1.0000 | ORAL_TABLET | ORAL | 0 refills | Status: DC | PRN
  Filled 2021-09-04: qty 30, 5d supply, fill #0

## 2021-09-04 MED ORDER — CEFAZOLIN SODIUM-DEXTROSE 2-4 GM/100ML-% IV SOLN
2.0000 g | INTRAVENOUS | Status: DC
  Filled 2021-09-04: qty 100

## 2021-09-04 MED ORDER — AMISULPRIDE (ANTIEMETIC) 5 MG/2ML IV SOLN: 10.0000 mg | Freq: Once | INTRAVENOUS | Status: DC | PRN

## 2021-09-04 MED ORDER — CHLORHEXIDINE GLUCONATE 0.12 % MT SOLN
15.0000 mL | Freq: Once | OROMUCOSAL | Status: AC
  Administered 2021-09-04: 15 mL via OROMUCOSAL
  Filled 2021-09-04: qty 15

## 2021-09-04 MED ORDER — FENTANYL CITRATE (PF) 100 MCG/2ML IJ SOLN: 100.0000 ug | Freq: Once | INTRAMUSCULAR | Status: AC

## 2021-09-04 MED ORDER — LACTATED RINGERS IV SOLN: INTRAVENOUS | Status: DC

## 2021-09-04 MED ORDER — ORAL CARE MOUTH RINSE: 15.0000 mL | Freq: Once | OROMUCOSAL | Status: AC

## 2021-09-04 MED ORDER — ACETAMINOPHEN 500 MG PO TABS
1000.0000 mg | ORAL_TABLET | Freq: Once | ORAL | Status: AC
  Administered 2021-09-04: 1000 mg via ORAL
  Filled 2021-09-04: qty 2

## 2021-09-04 MED ORDER — MIDAZOLAM HCL 2 MG/2ML IJ SOLN
INTRAMUSCULAR | Status: AC
  Filled 2021-09-04: qty 2

## 2021-09-04 MED ORDER — FENTANYL CITRATE (PF) 250 MCG/5ML IJ SOLN
INTRAMUSCULAR | Status: AC
  Filled 2021-09-04: qty 5

## 2021-09-04 MED ORDER — PROPOFOL 500 MG/50ML IV EMUL
INTRAVENOUS | Status: DC | PRN
  Administered 2021-09-04: 50 ug/kg/min via INTRAVENOUS

## 2021-09-04 MED ORDER — FENTANYL CITRATE (PF) 100 MCG/2ML IJ SOLN: 25.0000 ug | INTRAMUSCULAR | Status: DC | PRN

## 2021-09-04 MED ORDER — 0.9 % SODIUM CHLORIDE (POUR BTL) OPTIME
TOPICAL | Status: DC | PRN
  Administered 2021-09-04: 1000 mL

## 2021-09-04 MED ORDER — MIDAZOLAM HCL 2 MG/2ML IJ SOLN
1.0000 mg | Freq: Once | INTRAMUSCULAR | Status: AC
  Filled 2021-09-04: qty 1

## 2021-09-04 MED ORDER — MIDAZOLAM HCL 5 MG/5ML IJ SOLN
INTRAMUSCULAR | Status: DC | PRN
  Administered 2021-09-04: 2 mg via INTRAVENOUS

## 2021-09-04 MED ORDER — MIDAZOLAM HCL 2 MG/2ML IJ SOLN
INTRAMUSCULAR | Status: AC
  Administered 2021-09-04: 1 mg via INTRAVENOUS
  Filled 2021-09-04: qty 2

## 2021-09-04 MED ORDER — LACTATED RINGERS IV SOLN: INTRAVENOUS | Status: DC | PRN

## 2021-09-04 MED ORDER — BUPIVACAINE HCL (PF) 0.5 % IJ SOLN
INTRAMUSCULAR | Status: DC | PRN
  Administered 2021-09-04: 25 mL via PERINEURAL

## 2021-09-04 MED ORDER — ONDANSETRON HCL 4 MG/2ML IJ SOLN
INTRAMUSCULAR | Status: DC | PRN
  Administered 2021-09-04: 4 mg via INTRAVENOUS

## 2021-09-04 MED ORDER — FENTANYL CITRATE (PF) 100 MCG/2ML IJ SOLN
INTRAMUSCULAR | Status: AC
  Administered 2021-09-04: 100 ug via INTRAVENOUS
  Filled 2021-09-04: qty 2

## 2021-09-04 SURGICAL SUPPLY — 31 items
BAG COUNTER SPONGE SURGICOUNT (BAG) ×2 IMPLANT
BAG SPNG CNTER NS LX DISP (BAG) ×1
BLADE SURG 21 STRL SS (BLADE) ×2 IMPLANT
BNDG CMPR 9X4 STRL LF SNTH (GAUZE/BANDAGES/DRESSINGS)
BNDG COHESIVE 3X5 TAN ST LF (GAUZE/BANDAGES/DRESSINGS) ×1 IMPLANT
BNDG COHESIVE 4X5 TAN STRL (GAUZE/BANDAGES/DRESSINGS) ×2 IMPLANT
BNDG ESMARK 4X9 LF (GAUZE/BANDAGES/DRESSINGS) IMPLANT
BNDG GAUZE ELAST 4 BULKY (GAUZE/BANDAGES/DRESSINGS) ×2 IMPLANT
COVER SURGICAL LIGHT HANDLE (MISCELLANEOUS) ×4 IMPLANT
DRAPE U-SHAPE 47X51 STRL (DRAPES) ×2 IMPLANT
DRSG ADAPTIC 3X8 NADH LF (GAUZE/BANDAGES/DRESSINGS) ×1 IMPLANT
DRSG PAD ABDOMINAL 8X10 ST (GAUZE/BANDAGES/DRESSINGS) ×2 IMPLANT
DURAPREP 26ML APPLICATOR (WOUND CARE) ×2 IMPLANT
ELECT REM PT RETURN 9FT ADLT (ELECTROSURGICAL) ×2
ELECTRODE REM PT RTRN 9FT ADLT (ELECTROSURGICAL) ×1 IMPLANT
GAUZE SPONGE 4X4 12PLY STRL (GAUZE/BANDAGES/DRESSINGS) ×1 IMPLANT
GLOVE SURG ORTHO LTX SZ9 (GLOVE) ×2 IMPLANT
GLOVE SURG UNDER POLY LF SZ9 (GLOVE) ×2 IMPLANT
GOWN STRL REUS W/ TWL XL LVL3 (GOWN DISPOSABLE) ×2 IMPLANT
GOWN STRL REUS W/TWL XL LVL3 (GOWN DISPOSABLE) ×4
KIT BASIN OR (CUSTOM PROCEDURE TRAY) ×2 IMPLANT
KIT TURNOVER KIT B (KITS) ×2 IMPLANT
MANIFOLD NEPTUNE II (INSTRUMENTS) ×2 IMPLANT
NEEDLE 22X1 1/2 (OR ONLY) (NEEDLE) IMPLANT
NS IRRIG 1000ML POUR BTL (IV SOLUTION) ×2 IMPLANT
PACK ORTHO EXTREMITY (CUSTOM PROCEDURE TRAY) ×2 IMPLANT
PAD ABD 8X10 STRL (GAUZE/BANDAGES/DRESSINGS) ×1 IMPLANT
PAD ARMBOARD 7.5X6 YLW CONV (MISCELLANEOUS) ×4 IMPLANT
SUT ETHILON 2 0 PSLX (SUTURE) ×2 IMPLANT
SYR CONTROL 10ML LL (SYRINGE) IMPLANT
TOWEL GREEN STERILE (TOWEL DISPOSABLE) ×2 IMPLANT

## 2021-09-04 NOTE — Op Note (Signed)
09/04/2021  12:44 PM  PATIENT:  Sarah Long    PRE-OPERATIVE DIAGNOSIS:  Osteomyelitis Right Great Toe  POST-OPERATIVE DIAGNOSIS:  Same  PROCEDURE:  RIGHT GREAT TOE AMPUTATION  SURGEON:  Newt Minion, MD  PHYSICIAN ASSISTANT:None ANESTHESIA:   General  PREOPERATIVE INDICATIONS:  [name] is a  50 y.o. female with a diagnosis of Osteomyelitis Right Great Toe who failed conservative measures and elected for surgical management.    The risks benefits and alternatives were discussed with the patient preoperatively including but not limited to the risks of infection, bleeding, nerve injury, cardiopulmonary complications, the need for revision surgery, among others, and the patient was willing to proceed.  OPERATIVE IMPLANTS: none  @ENCIMAGES @  OPERATIVE FINDINGS: No infection or abscess at the level of amputation good petechial bleeding  OPERATIVE PROCEDURE: Patient was brought to the operating room after undergoing a regional anesthetic.  After adequate levels anesthesia were obtained patient's right lower extremity was prepped using DuraPrep draped into a sterile field a timeout was called.  A fishmouth incision was made just distal to the great toe MTP joint.  The toe was amputated through the MTP joint.  Electrocautery was used for hemostasis the wound was irrigated with normal saline there was good petechial bleeding no signs of infection at the level of amputation.  The incision was closed using 2-0 nylon a sterile dressing was applied patient was taken the PACU in stable condition   DISCHARGE PLANNING:  Antibiotic duration: Preoperative antibiotics  Weightbearing: Minimize weightbearing on the right lower extremity with a postoperative shoe  Pain medication: Prescription for Vicodin  Dressing care/ Wound VAC: Dry dressing change as needed  Ambulatory devices: Patient has a walker  Discharge to: Home.  Follow-up: In the office 1 week post operative.

## 2021-09-04 NOTE — Progress Notes (Signed)
Orthopedic Tech Progress Note Patient Details:  Sarah Long 1971-01-26 [id number]  Ortho Devices Type of Ortho Device: Darco shoe Ortho Device/Splint Interventions: Ordered      Danton Sewer [name] 09/04/2021, 1:33 PM

## 2021-09-04 NOTE — Anesthesia Procedure Notes (Addendum)
  Anesthesia Regional Block: Popliteal block   Pre-Anesthetic Checklist: , timeout performed,  Correct Patient, Correct Site, Correct Laterality,  Correct Procedure, Correct Position, site marked,  Risks and benefits discussed,  Surgical consent,  Pre-op evaluation,  At surgeon's request and post-op pain management  Laterality: Right  Prep: chloraprep       Needles:  Injection technique: Single-shot  Needle Type: Echogenic Stimulator Needle          Additional Needles:   Procedures:,,,, ultrasound used (permanent image in chart),,    Narrative:  Start time: 09/04/2021 11:27 AM End time: 09/04/2021 11:37 AM Injection made incrementally with aspirations every 5 mL.  Performed by: Personally  Anesthesiologist: Duane Boston, MD  Additional Notes: A functioning IV was confirmed and monitors were applied.  Sterile prep and drape, hand hygiene and sterile gloves were used.  Negative aspiration and test dose prior to incremental administration of local anesthetic. The patient tolerated the procedure well.Ultrasound  guidance: relevant anatomy identified, needle position confirmed, local anesthetic spread visualized around nerve(s), vascular puncture avoided.  Image printed for medical record.

## 2021-09-04 NOTE — Transfer of Care (Signed)
Immediate Anesthesia Transfer of Care Note  [name]  Procedure(s) Performed: RIGHT GREAT TOE AMPUTATION (Right)  Patient Location: PACU  Anesthesia Type:MAC combined with regional for post-op pain  Level of Consciousness: awake and alert   Airway & Oxygen Therapy: Patient connected to nasal cannula oxygen  Post-op Assessment: Report given to RN and Post -op Vital signs reviewed and stable  Post vital signs: Reviewed and stable  Last Vitals:  Vitals Value Taken Time  BP    Temp    Pulse 100 09/04/21 1245  Resp 15 09/04/21 1245  SpO2 100 % 09/04/21 1245  Vitals shown include unvalidated device data.  Last Pain:  Vitals:   09/04/21 1050  TempSrc:   PainSc: 0-No pain         Complications: No notable events documented.

## 2021-09-04 NOTE — Anesthesia Preprocedure Evaluation (Addendum)
Anesthesia Evaluation  Patient identified by MRN, date of birth, ID band Patient awake    Reviewed: Allergy & Precautions, NPO status , Patient's Chart, lab work & pertinent test results  Airway Mallampati: III  TM Distance: >3 FB Neck ROM: Full    Dental  (+) Dental Advisory Given   Pulmonary former smoker,    breath sounds clear to auscultation       Cardiovascular hypertension, Pt. on medications +CHF  + dysrhythmias + pacemaker  Rhythm:Regular Rate:Normal  1. Technically difficult; mild to moderate global reduction in LV  systolic function; severe LVH (myocardium with speckeld appearance;  suggest further evaluation for amyloid); mild MR.  2. Left ventricular ejection fraction, by estimation, is 40 to 45%. The  left ventricle has mildly decreased function. The left ventricle  demonstrates global hypokinesis. There is severe left ventricular  hypertrophy. Left ventricular diastolic parameters  are indeterminate.  3. Right ventricular systolic function is normal. The right ventricular  size is normal. There is normal pulmonary artery systolic pressure.  4. The mitral valve is normal in structure and function. Mild mitral  valve regurgitation. No evidence of mitral stenosis.  5. The aortic valve is tricuspid. Aortic valve regurgitation is not  visualized. No aortic stenosis is present.  6. The inferior vena cava is dilated in size with <50% respiratory  variability, suggesting right atrial pressure of 15 mmHg.    Neuro/Psych PSYCHIATRIC DISORDERS Depression Bipolar Disorder  Neuromuscular disease    GI/Hepatic negative GI ROS, Neg liver ROS,   Endo/Other  diabetes, Poorly Controlled, Type 2, Insulin Dependent  Renal/GU ARFRenal disease     Musculoskeletal   Abdominal   Peds  Hematology  (+) anemia ,   Anesthesia Other Findings   Reproductive/Obstetrics                              Lab Results  Component Value Date   WBC 7.9 07/30/2021   HGB 8.3 (L) 07/30/2021   HCT 26.4 (L) 07/30/2021   MCV 85 07/30/2021   PLT 342 07/30/2021   Lab Results  Component Value Date   CREATININE 0.80 07/30/2021   BUN 18 07/30/2021   NA 135 07/30/2021   K 4.3 07/30/2021   CL 101 07/30/2021   CO2 20 07/30/2021    Anesthesia Physical  Anesthesia Plan  ASA: 4  Anesthesia Plan: MAC   Post-op Pain Management:  Regional for Post-op pain   Induction: Intravenous  PONV Risk Score and Plan: 3 and Ondansetron, Dexamethasone, Treatment may vary due to age or medical condition and Midazolam  Airway Management Planned: Natural Airway and Simple Face Mask  Additional Equipment: None  Intra-op Plan:   Post-operative Plan:   Informed Consent: I have reviewed the patients History and Physical, chart, labs and discussed the procedure including the risks, benefits and alternatives for the proposed anesthesia with the patient or authorized representative who has indicated his/her understanding and acceptance.     Dental advisory given  Plan Discussed with: Anesthesiologist, CRNA and Surgeon  Anesthesia Plan Comments:        Anesthesia Quick Evaluation

## 2021-09-04 NOTE — H&P (Signed)
Sarah Long is an 50 y.o. female.   Chief Complaint: Right great Toe Osteomyelitis HPI: Patient is a 50 year old woman who was seen for 2 separate issues.  #1 she is 2 months status post a left transtibial amputation she has an appointment for [facility] on Friday for casting for prosthesis.  She states she is lost a lot of residual volume in her leg.  #2 she has a chronic ulcer over the medial border of the right great toe she has had an MRI scan and has been on doxycycline.  She is most recently transition to [place] and states that she is scheduled for hyperbaric treatment at the wound center with [facility].  Past Medical History:  Diagnosis Date   Abnormal Pap smear 1998   Abnormal vaginal bleeding 12/19/2011   Acute urinary retention 06/26/2021   Arthritis    Bacterial infection    Bipolar 1 disorder (HCC)    Blister of second toe of left foot 11/21/2016   Candida vaginitis 07/2007   Depression    recently added wellbutrin-has not taken yet for bipolar   Diabetes in pregnancy    Diabetes mellitus    nph 20U qam and qpm, regular with meals   Diabetic ketoacidosis without coma associated with type 2 diabetes mellitus ([place])    Fibroid    Galactorrhea of right breast 2008   GERD (gastroesophageal reflux disease)    H/O amenorrhea 07/2007   H/O dizziness 10/14/2011   H/O dysmenorrhea 2010   H/O menorrhagia 10/14/2011   H/O varicella    Headache(784.0)    Heavy vaginal bleeding due to contraceptive injection use 10/12/2011   Depo Provera   Herpes    HSV-2 infection 01/03/2009   Hx: UTI (urinary tract infection) 2009   Hypertension    on aldomet   Increased BMI 2010   Irregular uterine bleeding 04/04/2012   Pt has mirena    Obesity 10/14/2011   Oligomenorrhea 07/2007   Pelvic pain in female    Presence of permanent cardiac pacemaker    Preterm labor    Trichomonas    Yeast infection     Past Surgical History:  Procedure Laterality Date   ABDOMINAL AORTOGRAM W/LOWER EXTREMITY  N/A 06/17/2021   Procedure: ABDOMINAL AORTOGRAM W/LOWER EXTREMITY;  Surgeon: Wellington Hampshire, MD;  Location: [facility];  Service: Cardiovascular;  Laterality: N/A;   AMPUTATION Left 06/27/2021   Procedure: AMPUTATION BELOW KNEE;  Surgeon: Newt Minion, MD;  Location: [facility];  Service: Orthopedics;  Laterality: Left;   CESAREAN SECTION  1991   LEFT HEART CATH AND CORONARY ANGIOGRAPHY N/A 02/10/2020   Procedure: LEFT HEART CATH AND CORONARY ANGIOGRAPHY;  Surgeon: Troy Sine, MD;  Location: [facility];  Service: Cardiovascular;  Laterality: N/A;   PACEMAKER IMPLANT N/A 02/13/2020   Procedure: PACEMAKER IMPLANT;  Surgeon: Constance Haw, MD;  Location: [facility];  Service: Cardiovascular;  Laterality: N/A;   TEMPORARY PACEMAKER N/A 02/10/2020   Procedure: TEMPORARY PACEMAKER;  Surgeon: Troy Sine, MD;  Location: [facility];  Service: Cardiovascular;  Laterality: N/A;    Family History  Problem Relation Age of Onset   Hypertension Mother    Diabetes Mother    Heart disease Mother    Hypertension Father    Diabetes Father    Heart disease Father    Stroke Maternal Grandfather    Other Neg Hx    Social History:  reports that she quit smoking  about 2 months ago. Her smoking use included cigarettes. She has a 10.00 pack-year smoking history. She has never used smokeless tobacco. She reports that she does not drink alcohol and does not use drugs.  Allergies:  Allergies  Allergen Reactions   Strawberry Extract Hives   Sulfa Antibiotics Hives and Itching    No medications prior to admission.    No results found for this or any previous visit (from the past 48 hour(s)). No results found.  Review of Systems  All other systems reviewed and are negative.  Last menstrual period 07/01/2021. Physical Exam  Patient is alert, oriented, no adenopathy, well-dressed, normal affect, normal respiratory effort.  Patient's calf is 31 cm in  circumference. Examination patient has a well-healed left transtibial amputation she was placed in a size large short stump shrinker.  On the right foot she has a palpable dorsalis pedis pulse the Doppler was used and she has a strong triphasic dorsalis pedis pulse and a biphasic posterior tibial pulse.  Her ankle-brachial indices have shown calcified vessels but did show triphasic dorsalis pedis and posterior tibial pulses.  She has a necrotic ulcer with purulent drainage over the medial border of the right great toe.  The MRI scan does show osteomyelitis involving the entire tuft of the toe as well as osteomyelitis of the proximal phalanx. Heart RRR Lungs Clear   Patient's most recent hemoglobin A1c is 13.2. Assessment/Plan  1. Osteomyelitis of great toe of right foot ([place])       Plan: With the osteomyelitis involving the entire tuft of the great toe as well as part of the proximal phalanx have recommended amputation of the great toe through the MTP joint.  We will plan on surgery on Friday as outpatient surgery risk and benefits were discussed including importance of minimizing weightbearing on the forefoot to maximize chance of the wound healing.  We reviewed the importance of glucose control patient states she has recently stopped smoking and drinking.   Follow-Up Instructions: Return in about 1 week (around 09/08/2021).    Bevely Palmer Dorothye Berni, PA 09/04/2021, 6:40 AM

## 2021-09-04 NOTE — Interval H&P Note (Signed)
History and Physical Interval Note:  09/04/2021 11:26 AM  Sarah Long  has presented today for surgery, with the diagnosis of Osteomyelitis Right Great Toe.  The various methods of treatment have been discussed with the patient and family. After consideration of risks, benefits and other options for treatment, the patient has consented to  Procedure(s): RIGHT GREAT TOE AMPUTATION (Right) as a surgical intervention.  The patient's history has been reviewed, patient examined, no change in status, stable for surgery.  I have reviewed the patient's chart and labs.  Questions were answered to the patient's satisfaction.     Newt Minion

## 2021-09-04 NOTE — Anesthesia Postprocedure Evaluation (Signed)
Anesthesia Post Note  [name]  Procedure(s) Performed: RIGHT GREAT TOE AMPUTATION (Right)     Patient location during evaluation: PACU Anesthesia Type: MAC and Regional Level of consciousness: sedated Pain management: pain level controlled Vital Signs Assessment: post-procedure vital signs reviewed and stable Respiratory status: spontaneous breathing and respiratory function stable Cardiovascular status: stable Postop Assessment: no apparent nausea or vomiting Anesthetic complications: no   No notable events documented.  Last Vitals:  Vitals:   09/04/21 1346 09/04/21 1348  BP: (!) 151/89 (!) 147/91  Pulse: 95   Resp: 18 (!) 21  Temp: 36.5 C   SpO2: 100%     Last Pain:  Vitals:   09/04/21 1346  TempSrc:   PainSc: 0-No pain                 Allard Lightsey DANIEL

## 2021-09-06 ENCOUNTER — Encounter (HOSPITAL_COMMUNITY): Payer: Self-pay | Admitting: Orthopedic Surgery

## 2021-09-08 ENCOUNTER — Ambulatory Visit (INDEPENDENT_AMBULATORY_CARE_PROVIDER_SITE_OTHER): Payer: Medicaid Other | Admitting: Cardiovascular Disease

## 2021-09-08 ENCOUNTER — Other Ambulatory Visit: Payer: Self-pay

## 2021-09-08 ENCOUNTER — Encounter: Payer: Self-pay | Admitting: Cardiovascular Disease

## 2021-09-08 VITALS — BP 149/91 | HR 84 | Ht 64.0 in | Wt 180.0 lb

## 2021-09-08 DIAGNOSIS — E785 Hyperlipidemia, unspecified: Secondary | ICD-10-CM | POA: Diagnosis not present

## 2021-09-08 DIAGNOSIS — E11621 Type 2 diabetes mellitus with foot ulcer: Secondary | ICD-10-CM

## 2021-09-08 DIAGNOSIS — Z720 Tobacco use: Secondary | ICD-10-CM | POA: Diagnosis not present

## 2021-09-08 DIAGNOSIS — L97529 Non-pressure chronic ulcer of other part of left foot with unspecified severity: Secondary | ICD-10-CM

## 2021-09-08 NOTE — Progress Notes (Signed)
Cardiology Office Note   Date:  09/08/2021   ID:  Sarah Long, DOB 02-09-1971, MRN [id number]  PCP:  [name], MD  Cardiologist:   Lorine Bears, MD   No chief complaint on file.     History of Present Illness: Sarah Long is a 50 y.o. female who is here today for follow-up visit regarding poor healing wounds in the lower extremities.  She has known history of poorly controlled diabetes, chronic diastolic heart failure, essential hypertension, hyperlipidemia and tobacco use.  She has history of complete heart block status post dual-chamber pacemaker placement done last year by Dr. Elberta Fortis.  Cardiac catheterization in March 2021 showed normal coronary arteries. She smokes half a pack per day and has been smoking for more than 20 years.  She has been diabetic since she was 50 years old. She was seen few months ago f to evaluate for possible peripheral arterial disease given blisters and ulceration in the first and second toes of both feet few weeks ago without clear trauma.   Angiography was performed in July of this year which showed no significant peripheral arterial disease.  She had normal flow to the toes.  She ultimately underwent left below the knee amputation due to osteomyelitis and most recently right big toe amputation for the same reason.  Unfortunately, her diabetes continues to be uncontrolled with hemoglobin A1c of 13.  Past Medical History:  Diagnosis Date   Abnormal Pap smear 1998   Abnormal vaginal bleeding 12/19/2011   Acute urinary retention 06/26/2021   Arthritis    Bacterial infection    Bipolar 1 disorder (HCC)    Blister of second toe of left foot 11/21/2016   Candida vaginitis 07/2007   Depression    recently added wellbutrin-has not taken yet for bipolar   Diabetes in pregnancy    Diabetes mellitus    nph 20U qam and qpm, regular with meals   Diabetic ketoacidosis without coma associated with type 2 diabetes mellitus (HCC)    Fibroid     Galactorrhea of right breast 2008   GERD (gastroesophageal reflux disease)    H/O amenorrhea 07/2007   H/O dizziness 10/14/2011   H/O dysmenorrhea 2010   H/O menorrhagia 10/14/2011   H/O varicella    Headache(784.0)    Heavy vaginal bleeding due to contraceptive injection use 10/12/2011   Depo Provera   Herpes    HSV-2 infection 01/03/2009   Hx: UTI (urinary tract infection) 2009   Hypertension    on aldomet   Increased BMI 2010   Irregular uterine bleeding 04/04/2012   Pt has mirena    Obesity 10/14/2011   Oligomenorrhea 07/2007   Pelvic pain in female    Presence of permanent cardiac pacemaker    Preterm labor    Trichomonas    Yeast infection     Past Surgical History:  Procedure Laterality Date   ABDOMINAL AORTOGRAM W/LOWER EXTREMITY N/A 06/17/2021   Procedure: ABDOMINAL AORTOGRAM W/LOWER EXTREMITY;  Surgeon: Iran Ouch, MD;  Location: [facility];  Service: Cardiovascular;  Laterality: N/A;   AMPUTATION Left 06/27/2021   Procedure: AMPUTATION BELOW KNEE;  Surgeon: Nadara Mustard, MD;  Location: [facility];  Service: Orthopedics;  Laterality: Left;   AMPUTATION Right 09/04/2021   Procedure: RIGHT GREAT TOE AMPUTATION;  Surgeon: Nadara Mustard, MD;  [facility];  Service: Orthopedics;  Laterality: Right;   CESAREAN SECTION  1991   LEFT HEART CATH AND CORONARY  ANGIOGRAPHY N/A 02/10/2020   Procedure: LEFT HEART CATH AND CORONARY ANGIOGRAPHY;  Surgeon: Troy Sine, MD;  Location: [facility];  Service: Cardiovascular;  Laterality: N/A;   PACEMAKER IMPLANT N/A 02/13/2020   Procedure: PACEMAKER IMPLANT;  Surgeon: Constance Haw, MD;  Location: [facility];  Service: Cardiovascular;  Laterality: N/A;   TEMPORARY PACEMAKER N/A 02/10/2020   Procedure: TEMPORARY PACEMAKER;  Surgeon: Troy Sine, MD;  Location: [facility];  Service: Cardiovascular;  Laterality: N/A;     Current Outpatient Medications  Medication Sig Dispense Refill    HYDROcodone-acetaminophen (NORCO/VICODIN) 5-325 MG tablet Take 1 tablet by mouth every 4 (four) hours as needed for moderate pain. 30 tablet 0   insulin glargine (LANTUS SOLOSTAR) 100 UNIT/ML Solostar Pen Inject 60 Units into the skin daily. 15 mL 11   insulin lispro (HUMALOG) 100 UNIT/ML KwikPen Inject 5 Units into the skin 3 (three) times daily before meals. 6 mL 3   Accu-Chek Softclix Lancets lancets Use as directed up to 4 times daily (Patient not taking: Reported on 09/08/2021) 100 each 5   Blood Glucose Monitoring Suppl (ACCU-CHEK GUIDE) w/Device KIT Use as directed (Patient not taking: Reported on 09/08/2021) 1 kit 0   gabapentin (NEURONTIN) 300 MG capsule Take 1 capsule (300 mg total) by mouth 3 (three) times daily. (Patient not taking: Reported on 09/08/2021) 90 capsule 3   glucose blood (ACCU-CHEK GUIDE) test strip Use as instructed up to 4 times daily (Patient not taking: Reported on 09/08/2021) 100 each 12   Insulin Pen Needle (TECHLITE PEN NEEDLES) 32G X 4 MM MISC Use as directed with insulin pens (Patient not taking: Reported on 09/08/2021) 100 each 3   melatonin 3 MG TABS tablet Take 1 tablet (3 mg total) by mouth at bedtime as needed. (Patient not taking: Reported on 09/08/2021) 30 tablet 0   Multiple Vitamins-Minerals (ADULT ONE DAILY GUMMIES PO) Take 1 capsule by mouth daily. (Patient not taking: Reported on 09/08/2021)     No current facility-administered medications for this visit.    Allergies:   Strawberry extract and Sulfa antibiotics    Social History:  The patient  reports that she quit smoking about 2 months ago. Her smoking use included cigarettes. She has a 10.00 pack-year smoking history. She has never used smokeless tobacco. She reports that she does not drink alcohol and does not use drugs.   Family History:  The patient's family history includes Diabetes in her father and mother; Heart disease in her father and mother; Hypertension in her father and mother; Stroke in her  maternal grandfather.    ROS:  Please see the history of present illness.   Otherwise, review of systems are positive for none.   All other systems are reviewed and negative.    PHYSICAL EXAM: VS:  BP (!) 149/91   Pulse 84   Ht $R'5\' 4"'fh$  (1.626 m)   Wt 180 lb (81.6 kg)   SpO2 100%   BMI 30.90 kg/m  , BMI Body mass index is 30.9 kg/m. GEN: Well nourished, well developed, in no acute distress  HEENT: normal  Neck: no JVD, carotid bruits, or masses Cardiac: RRR; no murmurs, rubs, or gallops,no edema  Respiratory:  clear to auscultation bilaterally, normal work of breathing GI: soft, nontender, nondistended, + BS MS: no deformity or atrophy  Skin: warm and dry, no rash Neuro:  Strength and sensation are intact Psych: euthymic mood, full affect   EKG:  EKG  is not ordered today.   Recent Labs: 03/29/2021: BNP 261.8 06/28/2021: Magnesium 1.7 07/30/2021: ALT 7 09/04/2021: BUN 23; Creatinine, Ser 1.02; Hemoglobin 9.9; Platelets 224; Potassium 4.6; Sodium 136    Lipid Panel    Component Value Date/Time   CHOL 98 (L) 06/18/2021 1128   TRIG 117 06/18/2021 1128   HDL 37 (L) 06/18/2021 1128   CHOLHDL 2.6 06/18/2021 1128   CHOLHDL 3.5 04/28/2020 1511   VLDL 25 04/28/2020 1511   LDLCALC 40 06/18/2021 1128      Wt Readings from Last 3 Encounters:  09/08/21 180 lb (81.6 kg)  09/04/21 185 lb (83.9 kg)  08/05/21 181 lb (82.1 kg)       No flowsheet data found.    ASSESSMENT AND PLAN:  1.  Diabetic foot ulceration: Status post left below the knee amputation most recently right big toe amputation.  Angiography showed no evidence of peripheral arterial disease contributing to this poor healing which seems to be mostly related to uncontrolled diabetes.   Follow-up with me as needed.  2.  Hyperlipidemia: Consider treatment with a statin given that the patient is diabetic.  3.  Tobacco use: I had a prolonged discussion with her about the importance of smoking cessation in order to  prevent lower extremity amputation.  4.  Status post permanent pacemaker placement: Followed by Dr. Curt Bears.  The pacemaker seems to be functioning normally according to her EKG today.   Disposition:   FU with me as needed.  Signed,  [name], MD  09/08/2021 9:54 AM    [facility]

## 2021-09-08 NOTE — Patient Instructions (Signed)

## 2021-09-09 ENCOUNTER — Ambulatory Visit (INDEPENDENT_AMBULATORY_CARE_PROVIDER_SITE_OTHER): Payer: Medicaid Other | Admitting: Family

## 2021-09-09 DIAGNOSIS — Z89421 Acquired absence of other right toe(s): Secondary | ICD-10-CM

## 2021-09-18 ENCOUNTER — Telehealth: Payer: Self-pay

## 2021-09-18 NOTE — Telephone Encounter (Signed)
Patient is calling wanting more information about the Amputee Support Group.

## 2021-09-20 NOTE — Progress Notes (Signed)
Established Patient Office Visit  Subjective:  Patient ID: Sarah Long, female    DOB: 04-21-1971  Age: 50 y.o. MRN: [id number] Virtual Visit via Telephone Note  I connected with Sarah Long on 09/21/21 at 10:00 AM EDT by telephone and verified that I am speaking with the correct person using two identifiers.   Consent:  I discussed the limitations, risks, security and privacy concerns of performing an evaluation and management service by telephone and the availability of in person appointments. I also discussed with the patient that there may be a patient responsible charge related to this service. The patient expressed understanding and agreed to proceed.  Location of patient: Patient is in her car  Location of provider: I am in my office  Persons participating in the televisit with the patient.   No one else on the call    History of Present Illness:   CC: T2DM f/u  HPI Sarah Long presents for diabetes follow-up.  Unfortunately since the last visit in August she had to have her right great toe amputated.  Her wound on her left below-knee amputation has healed.  She continues to struggle with accessing healthy meals and only eats soups and sandwiches largely at lunchtime.  She is not exercising any because she cannot apply pressure to her right foot that is in a boot now and she has an amputation on the left.  She does state her blood sugars have been better and are running anywhere from 74-80 6 in the morning fasting.  She has seen our clinical pharmacist and is using her Humalog before meals and the Lantus at bedtime 60 units.  She is yet to call endocrinology for an appointment.  She did see cardiology in follow-up and they have signed off and that she does not have any peripheral artery disease.  She states her blood sugars are better since her right great toe was amputated.  She had been to the wound care clinic for her right foot but they have signed off. The patient has not  been taking his Zenpep on a regular basis and was reminded to resume this with food.  The patient has stopped the gabapentin.  She also had stopped the lovastatin and knows she needs to resume this for cholesterol.  Below is documentation from clinical pharmacy visit in September [name] 08/2021 Diabetes longstanding, currently uncontrolled based on A1c and CBG. Patient is able to verbalize appropriate hypoglycemia management plan. Medication adherence appears suboptimal due to patient forgetting to take doses, taking Humalog AFTER meals, and eating fast food and candy most days. Educated patient on the importance of being consistent with taking insulin so we can accurately adjust her regimen. Also expressed the importance of being consistent before she is seen by an endocrinologist.  -Continued basal insulin Lantus (insulin glargine) 60 units. Switched timing of injection from morning to night as that is the most convenient for her.  -Given that after she self titrated her Humalog to 10 units and she was not having hypoglycemia, will continue Humalog (insulin lispro) 10 units TID AC. Educated patient the importance of taking Humalog BEFORE meals. -Instructed patient to call the clinic if she has any BG readings <70 mg/dL.  -Patient would likely benefit from a GLP-1 RA once BG is more controlled.  -Extensively discussed pathophysiology of diabetes, recommended lifestyle interventions, dietary effects on blood sugar control -Counseled on s/sx of and management of hypoglycemia -Next A1C anticipated 09/2021.    Below  is documentation from cardiology in September Saw Cardiology:   1.  Diabetic foot ulceration: Status post left below the knee amputation most recently right big toe amputation.  Angiography showed no evidence of peripheral arterial disease contributing to this poor healing which seems to be mostly related to uncontrolled diabetes.   Follow-up with me as needed.   2.  Hyperlipidemia:  Consider treatment with a statin given that the patient is diabetic.   3.  Tobacco use: I had a prolonged discussion with her about the importance of smoking cessation in order to prevent lower extremity amputation.   4.  Status post permanent pacemaker placement: Followed by Dr. Curt Bears.  The pacemaker seems to be functioning normally according to her EKG today  Note the patient is yet to see endocrinology.  Past Medical History:  Diagnosis Date   Abnormal Pap smear 1998   Abnormal vaginal bleeding 12/19/2011   Acute urinary retention 06/26/2021   Arthritis    Bacterial infection    Bipolar 1 disorder (HCC)    Blister of second toe of left foot 11/21/2016   Candida vaginitis 07/2007   Depression    recently added wellbutrin-has not taken yet for bipolar   Diabetes in pregnancy    Diabetes mellitus    nph 20U qam and qpm, regular with meals   Diabetic ketoacidosis without coma associated with type 2 diabetes mellitus ([place])    Fibroid    Galactorrhea of right breast 2008   GERD (gastroesophageal reflux disease)    H/O amenorrhea 07/2007   H/O dizziness 10/14/2011   H/O dysmenorrhea 2010   H/O menorrhagia 10/14/2011   H/O varicella    Headache(784.0)    Heavy vaginal bleeding due to contraceptive injection use 10/12/2011   Depo Provera   Herpes    HSV-2 infection 01/03/2009   Hx: UTI (urinary tract infection) 2009   Hypertension    on aldomet   Increased BMI 2010   Irregular uterine bleeding 04/04/2012   Pt has mirena    Obesity 10/14/2011   Oligomenorrhea 07/2007   Pelvic pain in female    Presence of permanent cardiac pacemaker    Preterm labor    Trichomonas    Yeast infection     Past Surgical History:  Procedure Laterality Date   ABDOMINAL AORTOGRAM W/LOWER EXTREMITY N/A 06/17/2021   Procedure: ABDOMINAL AORTOGRAM W/LOWER EXTREMITY;  Surgeon: Wellington Hampshire, MD;  Location: [facility];  Service: Cardiovascular;  Laterality: N/A;   AMPUTATION Left  06/27/2021   Procedure: AMPUTATION BELOW KNEE;  Surgeon: Newt Minion, MD;  Location: [facility];  Service: Orthopedics;  Laterality: Left;   AMPUTATION Right 09/04/2021   Procedure: RIGHT GREAT TOE AMPUTATION;  Surgeon: Newt Minion, MD;  Location: [facility];  Service: Orthopedics;  Laterality: Right;   CESAREAN SECTION  1991   LEFT HEART CATH AND CORONARY ANGIOGRAPHY N/A 02/10/2020   Procedure: LEFT HEART CATH AND CORONARY ANGIOGRAPHY;  Surgeon: Troy Sine, MD;  Location: [facility];  Service: Cardiovascular;  Laterality: N/A;   PACEMAKER IMPLANT N/A 02/13/2020   Procedure: PACEMAKER IMPLANT;  Surgeon: Constance Haw, MD;  Location: [facility];  Service: Cardiovascular;  Laterality: N/A;   TEMPORARY PACEMAKER N/A 02/10/2020   Procedure: TEMPORARY PACEMAKER;  Surgeon: Troy Sine, MD;  Location: [facility];  Service: Cardiovascular;  Laterality: N/A;    Family History  Problem Relation Age of Onset   Hypertension Mother  Diabetes Mother    Heart disease Mother    Hypertension Father    Diabetes Father    Heart disease Father    Stroke Maternal Grandfather    Other Neg Hx     Social History   Socioeconomic History   Marital status: Married    Spouse name: Not on file   Number of children: Not on file   Years of education: Not on file   Highest education level: Not on file  Occupational History   Not on file  Tobacco Use   Smoking status: Former    Packs/day: 0.50    Years: 20.00    Pack years: 10.00    Types: Cigarettes    Quit date: 07/01/2021    Years since quitting: 0.2   Smokeless tobacco: Never  Vaping Use   Vaping Use: Never used  Substance and Sexual Activity   Alcohol use: No   Drug use: No   Sexual activity: Yes    Birth control/protection: None  Other Topics Concern   Not on file  Social History Narrative   Not on file   Social Determinants of Health   Financial Resource Strain: Not on file  Food Insecurity: Not on file   Transportation Needs: Not on file  Physical Activity: Not on file  Stress: Not on file  Social Connections: Not on file  Intimate Partner Violence: Not on file    Outpatient Medications Prior to Visit  Medication Sig Dispense Refill   Accu-Chek Softclix Lancets lancets Use as directed up to 4 times daily 100 each 5   Blood Glucose Monitoring Suppl (ACCU-CHEK GUIDE) w/Device KIT Use as directed 1 kit 0   glucose blood (ACCU-CHEK GUIDE) test strip Use as instructed up to 4 times daily 100 each 12   insulin glargine (LANTUS SOLOSTAR) 100 UNIT/ML Solostar Pen Inject 60 Units into the skin daily. 15 mL 11   insulin lispro (HUMALOG) 100 UNIT/ML KwikPen Inject 5 Units into the skin 3 (three) times daily before meals. (Patient taking differently: Inject 10 Units into the skin 3 (three) times daily before meals.) 6 mL 3   Insulin Pen Needle (TECHLITE PEN NEEDLES) 32G X 4 MM MISC Use as directed with insulin pens 100 each 3   melatonin 3 MG TABS tablet Take 1 tablet (3 mg total) by mouth at bedtime as needed. 30 tablet 0   Multiple Vitamins-Minerals (ADULT ONE DAILY GUMMIES PO) Take 1 capsule by mouth daily.     gabapentin (NEURONTIN) 300 MG capsule Take 1 capsule (300 mg total) by mouth 3 (three) times daily. 90 capsule 3   HYDROcodone-acetaminophen (NORCO/VICODIN) 5-325 MG tablet Take 1 tablet by mouth every 4 (four) hours as needed for moderate pain. (Patient not taking: Reported on 09/21/2021) 30 tablet 0   No facility-administered medications prior to visit.    Allergies  Allergen Reactions   Strawberry Extract Hives   Sulfa Antibiotics Hives and Itching    ROS Review of Systems  Constitutional:  Positive for activity change, appetite change and fatigue.  HENT: Negative.    Eyes: Negative.   Respiratory: Negative.    Cardiovascular: Negative.   Gastrointestinal: Negative.   Genitourinary: Negative.   Skin:  Positive for wound.  Neurological:  Positive for numbness.   Psychiatric/Behavioral:  Positive for decreased concentration. Negative for suicidal ideas. The patient is nervous/anxious.      Objective:    Physical Exam No exam this is a phone visit There were no vitals taken  for this visit. Wt Readings from Last 3 Encounters:  09/08/21 180 lb (81.6 kg)  09/04/21 185 lb (83.9 kg)  08/05/21 181 lb (82.1 kg)     Health Maintenance Due  Topic Date Due   COVID-19 Vaccine (3 - Pfizer risk series) 06/17/2020    There are no preventive care reminders to display for this patient.  Lab Results  Component Value Date   TSH 4.396 02/11/2020   Lab Results  Component Value Date   WBC 7.5 09/04/2021   HGB 9.9 (L) 09/04/2021   HCT 31.7 (L) 09/04/2021   MCV 85.9 09/04/2021   PLT 224 09/04/2021   Lab Results  Component Value Date   NA 136 09/04/2021   K 4.6 09/04/2021   CO2 20 (L) 09/04/2021   GLUCOSE 185 (H) 09/04/2021   BUN 23 (H) 09/04/2021   CREATININE 1.02 (H) 09/04/2021   BILITOT <0.2 07/30/2021   ALKPHOS 90 07/30/2021   AST 12 07/30/2021   ALT 7 07/30/2021   PROT 7.2 07/30/2021   ALBUMIN 3.4 (L) 07/30/2021   CALCIUM 9.0 09/04/2021   ANIONGAP 8 09/04/2021   EGFR 91 07/30/2021   Lab Results  Component Value Date   CHOL 98 (L) 06/18/2021   Lab Results  Component Value Date   HDL 37 (L) 06/18/2021   Lab Results  Component Value Date   LDLCALC 40 06/18/2021   Lab Results  Component Value Date   TRIG 117 06/18/2021   Lab Results  Component Value Date   CHOLHDL 2.6 06/18/2021   Lab Results  Component Value Date   HGBA1C 13.2 (H) 07/02/2021      Assessment & Plan:   Problem List Items Addressed This Visit       Cardiovascular and Mediastinum   HYPERTENSION, BENIGN SYSTEMIC (Chronic)    Blood pressure apparently under good control no change in medications      Relevant Medications   rosuvastatin (CRESTOR) 10 MG tablet   Chronic combined systolic and diastolic CHF (congestive heart failure) (HCC)     Compensated heart failure no change in medications      Relevant Medications   rosuvastatin (CRESTOR) 10 MG tablet     Digestive   Exocrine pancreatic insufficiency    Patient encouraged to resume Zen Pap        Endocrine   Type 2 diabetes mellitus ([place]) - Primary    Type 2 diabetes unc here for follow-up ontrolled patient encouraged to stay on Humalog and Lantus as prescribed resume lovastatin and call endocrinology for follow-up visit      Relevant Medications   rosuvastatin (CRESTOR) 10 MG tablet     Other   Right toe amputee ([place])    Patient is status post right toe amputation       Meds ordered this encounter  Medications   rosuvastatin (CRESTOR) 10 MG tablet    Sig: Take 1 tablet (10 mg total) by mouth daily.    Dispense:  90 tablet    Refill:  1   Pancrelipase, Lip-Prot-Amyl, (ZENPEP) 20000-63000 units CPEP    Sig: Take 1 capsule by mouth 3 (three) times daily before meals.    Dispense:  180 capsule    Refill:  4    Follow-up: Return in about 6 weeks (around 11/02/2021).   Follow Up Instructions: Patient knows a follow-up exam will occur in the next 6 weeks face-to-face and refills on her medications sent to her pharmacy she is also instructed to call endocrinology for  an appointment   I discussed the assessment and treatment plan with the patient. The patient was provided an opportunity to ask questions and all were answered. The patient agreed with the plan and demonstrated an understanding of the instructions.   The patient was advised to call back or seek an in-person evaluation if the symptoms worsen or if the condition fails to improve as anticipated.  I provided 29 minutes of non-face-to-face time during this encounter  including  median intraservice time , review of notes, labs, imaging, medications  and explaining diagnosis and management to the patient .     Asencion Noble, MD

## 2021-09-21 ENCOUNTER — Other Ambulatory Visit: Payer: Self-pay

## 2021-09-21 ENCOUNTER — Ambulatory Visit: Payer: Medicaid Other | Attending: Critical Care Medicine | Admitting: Critical Care Medicine

## 2021-09-21 ENCOUNTER — Encounter: Payer: Self-pay | Admitting: Critical Care Medicine

## 2021-09-21 DIAGNOSIS — E1122 Type 2 diabetes mellitus with diabetic chronic kidney disease: Secondary | ICD-10-CM

## 2021-09-21 DIAGNOSIS — Z794 Long term (current) use of insulin: Secondary | ICD-10-CM

## 2021-09-21 DIAGNOSIS — K8681 Exocrine pancreatic insufficiency: Secondary | ICD-10-CM | POA: Diagnosis not present

## 2021-09-21 DIAGNOSIS — I10 Essential (primary) hypertension: Secondary | ICD-10-CM

## 2021-09-21 DIAGNOSIS — I5042 Chronic combined systolic (congestive) and diastolic (congestive) heart failure: Secondary | ICD-10-CM | POA: Diagnosis not present

## 2021-09-21 DIAGNOSIS — N181 Chronic kidney disease, stage 1: Secondary | ICD-10-CM

## 2021-09-21 DIAGNOSIS — Z89421 Acquired absence of other right toe(s): Secondary | ICD-10-CM

## 2021-09-21 MED ORDER — ZENPEP 20000-63000 UNITS PO CPEP: 1.0000 | ORAL_CAPSULE | Freq: Three times a day (TID) | ORAL | 4 refills | Status: DC

## 2021-09-21 MED ORDER — ROSUVASTATIN CALCIUM 10 MG PO TABS
10.0000 mg | ORAL_TABLET | Freq: Every day | ORAL | 1 refills | Status: DC
  Filled 2021-09-21: qty 30, 30d supply, fill #0

## 2021-09-21 NOTE — Assessment & Plan Note (Signed)
Patient is status post right toe amputation

## 2021-09-21 NOTE — Assessment & Plan Note (Signed)
Blood pressure apparently under good control no change in medications

## 2021-09-21 NOTE — Assessment & Plan Note (Signed)
Patient encouraged to resume Zen Pap

## 2021-09-21 NOTE — Assessment & Plan Note (Signed)
Compensated heart failure no change in medications

## 2021-09-21 NOTE — Assessment & Plan Note (Addendum)
Type 2 diabetes unc here for follow-up ontrolled patient encouraged to stay on Humalog and Lantus as prescribed resume lovastatin and call endocrinology for follow-up visit

## 2021-09-22 NOTE — Telephone Encounter (Signed)
Patient would like peer group support information as well

## 2021-09-22 NOTE — Telephone Encounter (Signed)
Informed patient of information

## 2021-09-23 ENCOUNTER — Other Ambulatory Visit: Payer: Self-pay

## 2021-09-24 ENCOUNTER — Other Ambulatory Visit: Payer: Self-pay

## 2021-09-24 ENCOUNTER — Encounter: Payer: Self-pay | Admitting: Orthopedic Surgery

## 2021-09-24 ENCOUNTER — Ambulatory Visit (INDEPENDENT_AMBULATORY_CARE_PROVIDER_SITE_OTHER): Payer: Medicaid Other | Admitting: Orthopedic Surgery

## 2021-09-24 DIAGNOSIS — Z89512 Acquired absence of left leg below knee: Secondary | ICD-10-CM

## 2021-09-24 DIAGNOSIS — Z89411 Acquired absence of right great toe: Secondary | ICD-10-CM

## 2021-09-24 NOTE — Progress Notes (Signed)
Office Visit Note   [name]           Date of Birth: [birth year]           MRN: [id number] Visit Date: 09/24/2021              Requested by: [name], MD [address],  [place] [address] PCP: [name], MD  Chief Complaint  Patient presents with   Left Leg - Routine Post Op    // left BKA    Right Foot - Routine Post Op    // right GT amputation       HPI: Patient is a 50 year old woman who was seen in follow-up status post a left transtibial amputation 3 months ago and 3 weeks status post a right great toe amputation.  Patient is just picked up her prosthesis today from [facility].  Assessment & Plan: Visit Diagnoses:  1. Left below-knee amputee ([place])   2. Right great toe amputee ([place])     Plan: The sutures are harvested patient was given instructions and demonstrated Achilles stretching on the right.  She has orders placed for therapy at [facility] neuro rehab.  Follow-Up Instructions: Return in about 4 weeks (around 10/22/2021).   Ortho Exam  Patient is alert, oriented, no adenopathy, well-dressed, normal affect, normal respiratory effort. Examination she has a well fitting prosthesis on the left.  Right foot the incision is healed well from the great toe amputation.  Sutures are harvested.  She has dorsiflexion 20 degrees short of neutral with her knee extended she was given instructions and demonstrated stretching she does have clawing of the toes but no ulceration.  Imaging: No results found. No images are attached to the encounter.  Labs: Lab Results  Component Value Date   HGBA1C 13.2 (H) 07/02/2021   HGBA1C 13.6 (A) 06/18/2021   HGBA1C 8.4 (A) 05/15/2020   LABURIC 4.1 10/21/2016   LABURIC 4.2 11/20/2010   REPTSTATUS 08/24/2021 FINAL 08/19/2021   GRAMSTAIN NO WBC SEEN NO ORGANISMS SEEN  08/19/2021   CULT  08/19/2021    RARE PSEUDOMONAS AERUGINOSA WITHIN MIXED ORGANISMS NO ANAEROBES ISOLATED Performed at [facility], [place] [address], [place], [place] [address]    LABORGA PSEUDOMONAS AERUGINOSA 08/19/2021     Lab Results  Component Value Date   ALBUMIN 3.4 (L) 07/30/2021   ALBUMIN 2.2 (L) 07/13/2021   ALBUMIN 1.5 (L) 07/02/2021    Lab Results  Component Value Date   MG 1.7 06/28/2021   MG 1.8 06/27/2021   MG 1.7 06/27/2021   Lab Results  Component Value Date   VD25OH 24.1 (L) 03/11/2020   VD25OH <4.20 (L) 02/15/2020    No results found for: PREALBUMIN CBC EXTENDED Latest Ref Rng & Units 09/04/2021 07/30/2021 07/06/2021  WBC 4.0 - 10.5 K/uL 7.5 7.9 10.0  RBC 3.87 - 5.11 MIL/uL 3.69(L) 3.11(L) 2.95(L)  HGB 12.0 - 15.0 g/dL 9.9(L) 8.3(L) 8.1(L)  HCT 36.0 - 46.0 % 31.7(L) 26.4(L) 26.6(L)  PLT 150 - 400 K/uL 224 342 332  NEUTROABS 1.4 - 7.0 x10E3/uL - 5.8 -  LYMPHSABS 0.7 - 3.1 x10E3/uL - 1.6 -     There is no height or weight on file to calculate BMI.  Orders:  Orders Placed This Encounter  Procedures   Ambulatory referral to Physical Therapy   No orders of the defined types were placed in this encounter.    Procedures: No procedures performed  Clinical Data:  No additional findings.  ROS:  All other systems negative, except as noted in the HPI. Review of Systems  Objective: Vital Signs: There were no vitals taken for this visit.  Specialty Comments:  No specialty comments available.  PMFS History: Patient Active Problem List   Diagnosis Date Noted   Right toe amputee ([place])    Left below-knee amputee ([place]) 07/01/2021   Protein-calorie malnutrition ([place]) 06/26/2021   Hypoalbuminemia due to protein-calorie malnutrition ([place]) 06/26/2021   Normocytic anemia 06/26/2021   Heart failure with mid-range ejection fraction ([place]) 06/26/2021   Diabetic nephropathy with proteinuria ([place]) 06/26/2021   Diabetic foot ulcer ([place]) 05/26/2021   Bacterial vaginosis 07/17/2020   Dental caries 04/01/2020   Cracked tooth 04/01/2020   Alcohol use 03/11/2020   Vitamin D  deficiency 03/11/2020   Prolonged QT interval 02/26/2020   Chronic combined systolic and diastolic CHF (congestive heart failure) ([place]) 02/26/2020   Heart block AV complete ([place]) 02/11/2020   Exocrine pancreatic insufficiency    Dysmenorrhea 10/02/2019   DM type 2 with diabetic peripheral neuropathy ([place]) 05/30/2019   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 block) 05/29/2019   Bilateral bunions 05/29/2019   Type 2 diabetes mellitus ([place]) 10/21/2016   Smoking 11/26/2013   Hypercholesteremia 02/02/2007   Bipolar 1 disorder ([place]) 02/02/2007   HYPERTENSION, BENIGN SYSTEMIC 02/02/2007   Past Medical History:  Diagnosis Date   Abnormal Pap smear 1998   Abnormal vaginal bleeding 12/19/2011   Acute urinary retention 06/26/2021   Arthritis    Bacterial infection    Bipolar 1 disorder ([place])    Blister of second toe of left foot 11/21/2016   Candida vaginitis 07/2007   Depression    recently added wellbutrin-has not taken yet for bipolar   Diabetes in pregnancy    Diabetes mellitus    nph 20U qam and qpm, regular with meals   Diabetic ketoacidosis without coma associated with type 2 diabetes mellitus ([place])    Fibroid    Galactorrhea of right breast 2008   GERD (gastroesophageal reflux disease)    H/O amenorrhea 07/2007   H/O dizziness 10/14/2011   H/O dysmenorrhea 2010   H/O menorrhagia 10/14/2011   H/O varicella    Headache(784.0)    Heavy vaginal bleeding due to contraceptive injection use 10/12/2011   Depo Provera   Herpes    HSV-2 infection 01/03/2009   Hx: UTI (urinary tract infection) 2009   Hypertension    on aldomet   Increased BMI 2010   Irregular uterine bleeding 04/04/2012   Pt has mirena    Obesity 10/14/2011   Oligomenorrhea 07/2007   Pelvic pain in female    Presence of permanent cardiac pacemaker    Preterm labor    Trichomonas    Yeast infection     Family History  Problem Relation Age of Onset   Hypertension Mother    Diabetes  Mother    Heart disease Mother    Hypertension Father    Diabetes Father    Heart disease Father    Stroke Maternal Grandfather    Other Neg Hx     Past Surgical History:  Procedure Laterality Date   ABDOMINAL AORTOGRAM W/LOWER EXTREMITY N/A 06/17/2021   Procedure: ABDOMINAL AORTOGRAM W/LOWER EXTREMITY;  Surgeon: Wellington Hampshire, MD;  Location: [facility];  Service: Cardiovascular;  Laterality: N/A;   AMPUTATION Left 06/27/2021   Procedure: AMPUTATION BELOW KNEE;  Surgeon: Newt Minion, MD;  Location: [facility];  Service: Orthopedics;  Laterality: Left;   AMPUTATION Right 09/04/2021   Procedure: RIGHT GREAT TOE AMPUTATION;  Surgeon: Newt Minion, MD;  Location: [facility];  Service: Orthopedics;  Laterality: Right;   CESAREAN SECTION  1991   LEFT HEART CATH AND CORONARY ANGIOGRAPHY N/A 02/10/2020   Procedure: LEFT HEART CATH AND CORONARY ANGIOGRAPHY;  Surgeon: Troy Sine, MD;  Location: [facility];  Service: Cardiovascular;  Laterality: N/A;   PACEMAKER IMPLANT N/A 02/13/2020   Procedure: PACEMAKER IMPLANT;  Surgeon: Constance Haw, MD;  Location: [facility];  Service: Cardiovascular;  Laterality: N/A;   TEMPORARY PACEMAKER N/A 02/10/2020   Procedure: TEMPORARY PACEMAKER;  Surgeon: Troy Sine, MD;  Location: [facility];  Service: Cardiovascular;  Laterality: N/A;   Social History   Occupational History   Not on file  Tobacco Use   Smoking status: Former    Packs/day: 0.50    Years: 20.00    Pack years: 10.00    Types: Cigarettes    Quit date: 07/01/2021    Years since quitting: 0.2   Smokeless tobacco: Never  Vaping Use   Vaping Use: Never used  Substance and Sexual Activity   Alcohol use: No   Drug use: No   Sexual activity: Yes    Birth control/protection: None

## 2021-09-25 ENCOUNTER — Encounter: Payer: Self-pay | Admitting: Family

## 2021-09-25 NOTE — Progress Notes (Signed)
Post-Op Visit Note   [name]           Date of Birth: [birth year]           MRN: [id number] Visit Date: 09/09/2021 PCP: [name], MD  Chief Complaint:  Chief Complaint  Patient presents with   Right Foot - Routine Post Op    Right great toe amp  09-04-2021    HPI:  HPI The patient is a 50 year old woman seen status post right great toe amputation September 30. Ortho Exam Incision well approximated sutures.  There is no gaping drainage no surrounding erythema no sign of infection  Visit Diagnoses: No diagnosis found.  Plan: Begin daily Dial soap cleansing.  Dry dressing changes.  Follow-up in 2 weeks for suture removal.  Follow-Up Instructions: No follow-ups on file.   Imaging: No results found.  Orders:  No orders of the defined types were placed in this encounter.  No orders of the defined types were placed in this encounter.    PMFS History: Patient Active Problem List   Diagnosis Date Noted   Right toe amputee ([place])    Left below-knee amputee ([place]) 07/01/2021   Protein-calorie malnutrition ([place]) 06/26/2021   Hypoalbuminemia due to protein-calorie malnutrition ([place]) 06/26/2021   Normocytic anemia 06/26/2021   Heart failure with mid-range ejection fraction ([place]) 06/26/2021   Diabetic nephropathy with proteinuria ([place]) 06/26/2021   Diabetic foot ulcer ([place]) 05/26/2021   Bacterial vaginosis 07/17/2020   Dental caries 04/01/2020   Cracked tooth 04/01/2020   Alcohol use 03/11/2020   Vitamin D deficiency 03/11/2020   Prolonged QT interval 02/26/2020   Chronic combined systolic and diastolic CHF (congestive heart failure) ([place]) 02/26/2020   Heart block AV complete ([place]) 02/11/2020   Exocrine pancreatic insufficiency    Dysmenorrhea 10/02/2019   DM type 2 with diabetic peripheral neuropathy ([place]) 05/30/2019   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 block) 05/29/2019   Bilateral bunions 05/29/2019   Type 2 diabetes  mellitus ([place]) 10/21/2016   Smoking 11/26/2013   Hypercholesteremia 02/02/2007   Bipolar 1 disorder ([place]) 02/02/2007   HYPERTENSION, BENIGN SYSTEMIC 02/02/2007   Past Medical History:  Diagnosis Date   Abnormal Pap smear 1998   Abnormal vaginal bleeding 12/19/2011   Acute urinary retention 06/26/2021   Arthritis    Bacterial infection    Bipolar 1 disorder ([place])    Blister of second toe of left foot 11/21/2016   Candida vaginitis 07/2007   Depression    recently added wellbutrin-has not taken yet for bipolar   Diabetes in pregnancy    Diabetes mellitus    nph 20U qam and qpm, regular with meals   Diabetic ketoacidosis without coma associated with type 2 diabetes mellitus ([place])    Fibroid    Galactorrhea of right breast 2008   GERD (gastroesophageal reflux disease)    H/O amenorrhea 07/2007   H/O dizziness 10/14/2011   H/O dysmenorrhea 2010   H/O menorrhagia 10/14/2011   H/O varicella    Headache(784.0)    Heavy vaginal bleeding due to contraceptive injection use 10/12/2011   Depo Provera   Herpes    HSV-2 infection 01/03/2009   Hx: UTI (urinary tract infection) 2009   Hypertension    on aldomet   Increased BMI 2010   Irregular uterine bleeding 04/04/2012   Pt has mirena    Obesity 10/14/2011   Oligomenorrhea 07/2007   Pelvic pain in female    Presence of permanent  cardiac pacemaker    Preterm labor    Trichomonas    Yeast infection     Family History  Problem Relation Age of Onset   Hypertension Mother    Diabetes Mother    Heart disease Mother    Hypertension Father    Diabetes Father    Heart disease Father    Stroke Maternal Grandfather    Other Neg Hx     Past Surgical History:  Procedure Laterality Date   ABDOMINAL AORTOGRAM W/LOWER EXTREMITY N/A 06/17/2021   Procedure: ABDOMINAL AORTOGRAM W/LOWER EXTREMITY;  Surgeon: Wellington Hampshire, MD;  Location: [facility];  Service: Cardiovascular;  Laterality: N/A;   AMPUTATION Left 06/27/2021    Procedure: AMPUTATION BELOW KNEE;  Surgeon: Newt Minion, MD;  Location: [facility];  Service: Orthopedics;  Laterality: Left;   AMPUTATION Right 09/04/2021   Procedure: RIGHT GREAT TOE AMPUTATION;  Surgeon: Newt Minion, MD;  Location: [facility];  Service: Orthopedics;  Laterality: Right;   CESAREAN SECTION  1991   LEFT HEART CATH AND CORONARY ANGIOGRAPHY N/A 02/10/2020   Procedure: LEFT HEART CATH AND CORONARY ANGIOGRAPHY;  Surgeon: Troy Sine, MD;  Location: [facility];  Service: Cardiovascular;  Laterality: N/A;   PACEMAKER IMPLANT N/A 02/13/2020   Procedure: PACEMAKER IMPLANT;  Surgeon: Constance Haw, MD;  Location: [facility];  Service: Cardiovascular;  Laterality: N/A;   TEMPORARY PACEMAKER N/A 02/10/2020   Procedure: TEMPORARY PACEMAKER;  Surgeon: Troy Sine, MD;  Location: [facility];  Service: Cardiovascular;  Laterality: N/A;   Social History   Occupational History   Not on file  Tobacco Use   Smoking status: Former    Packs/day: 0.50    Years: 20.00    Pack years: 10.00    Types: Cigarettes    Quit date: 07/01/2021    Years since quitting: 0.2   Smokeless tobacco: Never  Vaping Use   Vaping Use: Never used  Substance and Sexual Activity   Alcohol use: No   Drug use: No   Sexual activity: Yes    Birth control/protection: None

## 2021-09-30 ENCOUNTER — Ambulatory Visit: Payer: Self-pay | Admitting: *Deleted

## 2021-09-30 NOTE — Telephone Encounter (Signed)
The patient would like to be contacted regarding swelling in their right foot and left leg   The patient has had a below the knee amputation of their left leg and been unable to wear their prosthesis   The patient has experienced swelling in both legs for roughly a week   Please contact further       2nd attempt to contact patient to review sympotms. No answer, left message on voicemail to call clinic back at [phone].

## 2021-09-30 NOTE — Telephone Encounter (Signed)
Summary: foot and leg swelling   The patient would like to be contacted regarding swelling in their right foot and left leg   The patient has had a below the knee amputation of their left leg and been unable to wear their prosthesis   The patient has experienced swelling in both legs for roughly a week   Please contact further     Attempted to call patient-left message to call office

## 2021-09-30 NOTE — Telephone Encounter (Signed)
Pt called, left VM to return call to office to discuss with nurse. Unable to reach pt after 3 attempts by [facility] NT, routing to provider for resolution per protocol.

## 2021-10-01 NOTE — Telephone Encounter (Signed)
This pt will need OV to examine her, any provider is ok or double me

## 2021-10-06 ENCOUNTER — Ambulatory Visit: Payer: Self-pay

## 2021-10-06 NOTE — Telephone Encounter (Signed)
Pt c/o right lower leg and foot edema. Onset 2 weeks ago. Pt c/o mild to moderate swelling. Pt denied pitting edema. Pain and tingling to right foot. Right great toe was amputated and pt noted purulent drainage from incison. Ot stated this occurred 2-3 days after stitches out. Denies redness, warmth, calf pain.  Pt stated that the last time she has leg swelling during pregnancy. Pt has a pacemaker and has not seen cardiologist in "2 years." Pt stated she has mild chest pain over where the pacemaker lies but last 1-2 seconds. Denied radiation pain.  Pt stated she did not see any signs of infection.  Care advice was given and pt verbalized understanding.  Appt accepted for tomorrow 10/07/21 at 1110 with [facility].      Reason for Disposition . [1] MODERATE leg swelling (e.g., swelling extends up to knees) AND [2] new-onset or worsening  Answer Assessment - Initial Assessment Questions 1. ONSET: "When did the swelling start?" (e.g., minutes, hours, days)     2 weeks ago 2. LOCATION: "What part of the leg is swollen?"  "Are both legs swollen or just one leg?"    Right leg from the knee to right 3. SEVERITY: "How bad is the swelling?" (e.g., localized; mild, moderate, severe)  - Localized - small area of swelling localized to one leg  - MILD pedal edema - swelling limited to foot and ankle, pitting edema < 1/4 inch (6 mm) deep, rest and elevation eliminate most or all swelling  - MODERATE edema - swelling of lower leg to knee, pitting edema > 1/4 inch (6 mm) deep, rest and elevation only partially reduce swelling  - SEVERE edema - swelling extends above knee, facial or hand swelling present      moderate 4. REDNESS: "Does the swelling look red or infected?"     no 5. PAIN: "Is the swelling painful to touch?" If Yes, ask: "How painful is it?"   (Scale 1-10; mild, moderate or severe)     Pain and tingly to the right foot 6. FEVER: "Do you have a fever?" If Yes, ask: "What is it, how was  it measured, and when did it start?"      no 7. CAUSE: "What do you think is causing the leg swelling?"     Right great toe amputation-  8. MEDICAL HISTORY: "Do you have a history of heart failure, kidney disease, liver failure, or cancer?"     Pacemaker, pancreatitis,  alcohol use 9. RECURRENT SYMPTOM: "Have you had leg swelling before?" If Yes, ask: "When was the last time?" "What happened that time?"     Yes- during pregnancy- within last year 10. OTHER SYMPTOMS: "Do you have any other symptoms?" (e.g., chest pain, difficulty breathing)      Mild chest pain at pacemaker nn radiating 1-2  11. PREGNANCY: "Is there any chance you are pregnant?" "When was your last menstrual period?"       N/a  Protocols used: Leg Swelling and Edema-A-AH

## 2021-10-07 ENCOUNTER — Other Ambulatory Visit: Payer: Self-pay

## 2021-10-07 ENCOUNTER — Ambulatory Visit: Payer: Medicaid Other | Attending: Physician Assistant | Admitting: Physician Assistant

## 2021-10-07 ENCOUNTER — Encounter: Payer: Self-pay | Admitting: Physician Assistant

## 2021-10-07 ENCOUNTER — Ambulatory Visit: Payer: Medicaid Other | Attending: Orthopedic Surgery

## 2021-10-07 VITALS — BP 151/90 | HR 80 | Ht 64.0 in | Wt 210.0 lb

## 2021-10-07 DIAGNOSIS — R2689 Other abnormalities of gait and mobility: Secondary | ICD-10-CM | POA: Insufficient documentation

## 2021-10-07 DIAGNOSIS — R600 Localized edema: Secondary | ICD-10-CM | POA: Diagnosis not present

## 2021-10-07 DIAGNOSIS — I452 Bifascicular block: Secondary | ICD-10-CM | POA: Diagnosis not present

## 2021-10-07 DIAGNOSIS — Z89512 Acquired absence of left leg below knee: Secondary | ICD-10-CM | POA: Insufficient documentation

## 2021-10-07 DIAGNOSIS — N181 Chronic kidney disease, stage 1: Secondary | ICD-10-CM

## 2021-10-07 DIAGNOSIS — M6281 Muscle weakness (generalized): Secondary | ICD-10-CM | POA: Insufficient documentation

## 2021-10-07 DIAGNOSIS — I5020 Unspecified systolic (congestive) heart failure: Secondary | ICD-10-CM

## 2021-10-07 DIAGNOSIS — I5022 Chronic systolic (congestive) heart failure: Secondary | ICD-10-CM | POA: Diagnosis not present

## 2021-10-07 DIAGNOSIS — E1122 Type 2 diabetes mellitus with diabetic chronic kidney disease: Secondary | ICD-10-CM | POA: Diagnosis not present

## 2021-10-07 DIAGNOSIS — G4700 Insomnia, unspecified: Secondary | ICD-10-CM

## 2021-10-07 DIAGNOSIS — Z794 Long term (current) use of insulin: Secondary | ICD-10-CM

## 2021-10-07 DIAGNOSIS — I10 Essential (primary) hypertension: Secondary | ICD-10-CM

## 2021-10-07 MED ORDER — MELATONIN 3 MG PO TABS
3.0000 mg | ORAL_TABLET | Freq: Every evening | ORAL | 0 refills | Status: DC | PRN
  Filled 2021-10-07: qty 30, 30d supply, fill #0

## 2021-10-07 MED ORDER — FUROSEMIDE 20 MG PO TABS
20.0000 mg | ORAL_TABLET | Freq: Every day | ORAL | 3 refills | Status: DC
  Filled 2021-10-07: qty 30, 30d supply, fill #0
  Filled 2021-11-04: qty 30, 30d supply, fill #1

## 2021-10-07 MED ORDER — INSULIN LISPRO (1 UNIT DIAL) 100 UNIT/ML (KWIKPEN)
10.0000 [IU] | PEN_INJECTOR | Freq: Three times a day (TID) | SUBCUTANEOUS | 3 refills | Status: DC
  Filled 2021-10-07: qty 6, 20d supply, fill #0

## 2021-10-07 NOTE — Progress Notes (Signed)
Swelling in feet.

## 2021-10-07 NOTE — Progress Notes (Signed)
Established Patient Office Visit  Subjective:  Patient ID: Sarah Long, female    DOB: 1971/11/11  Age: 50 y.o. MRN: [id number]  CC:  Chief Complaint  Patient presents with   Foot Swelling         HPI Sarah Long reports that she has been having swelling in her right lower leg and right foot and ankle.  Reports that this started approximately 2 weeks ago.  Reports that it does feel tight.  Does endorse that she has been wearing her prosthesis on her left leg and is no longer having to use a wheelchair.  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  States that she has had some shortness of breath, but attributes that to restarting smoking.  Also endorses that she has been eating a higher sodium diet.  Reports that her right great toe was amputated September 04, 2021.  Sutures were removed on September 24, 2021.  Patient states that she did notice some purulent discharge from her incision 2 to 3 days after her stitches were removed.  Reports that she was encouraged by orthopedics to continue working on stretching out her Achilles tendon, states that she has been told she was going to be given physical therapy but has not heard from them yet.   Ortho 09/24/21 HPI: Patient is a 50 year old woman who was seen in follow-up status post a left transtibial amputation 3 months ago and 3 weeks status post a right great toe amputation.  Patient is just picked up her prosthesis today from [place].   Assessment & Plan: Visit Diagnoses:  1. Left below-knee amputee (Sarah)   2. Right great toe amputee (Sarah Long)       Plan: The sutures are harvested patient was given instructions and demonstrated Achilles stretching on the right.  She has orders placed for therapy at [facility] neuro rehab.   Does request refill on her Humalog, states it was increased to 10 units 3 times a day.  Reports that her blood glucose this morning was 83.   Past Medical History:  Diagnosis Date   Abnormal  Pap smear 1998   Abnormal vaginal bleeding 12/19/2011   Acute urinary retention 06/26/2021   Arthritis    Bacterial infection    Bipolar 1 disorder (HCC)    Blister of second toe of left foot 11/21/2016   Candida vaginitis 07/2007   Depression    recently added wellbutrin-has not taken yet for bipolar   Diabetes in pregnancy    Diabetes mellitus    nph 20U qam and qpm, regular with meals   Diabetic ketoacidosis without coma associated with type 2 diabetes mellitus ([place])    Fibroid    Galactorrhea of right breast 2008   GERD (gastroesophageal reflux disease)    H/O amenorrhea 07/2007   H/O dizziness 10/14/2011   H/O dysmenorrhea 2010   H/O menorrhagia 10/14/2011   H/O varicella    Headache(784.0)    Heavy vaginal bleeding due to contraceptive injection use 10/12/2011   Depo Provera   Herpes    HSV-2 infection 01/03/2009   Hx: UTI (urinary tract infection) 2009   Hypertension    on aldomet   Increased BMI 2010   Irregular uterine bleeding 04/04/2012   Pt has mirena    Obesity 10/14/2011   Oligomenorrhea 07/2007   Pelvic pain in female    Presence of permanent cardiac pacemaker    Preterm labor    Trichomonas  Yeast infection     Past Surgical History:  Procedure Laterality Date   ABDOMINAL AORTOGRAM W/LOWER EXTREMITY N/A 06/17/2021   Procedure: ABDOMINAL AORTOGRAM W/LOWER EXTREMITY;  Surgeon: Wellington Hampshire, MD;  Location: [facility];  Service: Cardiovascular;  Laterality: N/A;   AMPUTATION Left 06/27/2021   Procedure: AMPUTATION BELOW KNEE;  Surgeon: Newt Minion, MD;  [facility]: [facility];  Service: Orthopedics;  Laterality: Left;   AMPUTATION Right 09/04/2021   Procedure: RIGHT GREAT TOE AMPUTATION;  Surgeon: Newt Minion, MD;  Location: [facility];  Service: Orthopedics;  Laterality: Right;   CESAREAN SECTION  1991   LEFT HEART CATH AND CORONARY ANGIOGRAPHY N/A 02/10/2020   Procedure: LEFT HEART CATH AND CORONARY ANGIOGRAPHY;  Surgeon: Troy Sine, MD;   Location: [facility];  Service: Cardiovascular;  Laterality: N/A;   PACEMAKER IMPLANT N/A 02/13/2020   Procedure: PACEMAKER IMPLANT;  Surgeon: Constance Haw, MD;  Location: [facility];  Service: Cardiovascular;  Laterality: N/A;   TEMPORARY PACEMAKER N/A 02/10/2020   Procedure: TEMPORARY PACEMAKER;  Surgeon: Troy Sine, MD;  Location: [facility];  Service: Cardiovascular;  Laterality: N/A;    Family History  Problem Relation Age of Onset   Hypertension Mother    Diabetes Mother    Heart disease Mother    Hypertension Father    Diabetes Father    Heart disease Father    Stroke Maternal Grandfather    Other Neg Hx     Social History   Socioeconomic History   Marital status: Married    Spouse name: Not on file   Number of children: Not on file   Years of education: Not on file   Highest education level: Not on file  Occupational History   Not on file  Tobacco Use   Smoking status: Former    Packs/day: 0.50    Years: 20.00    Pack years: 10.00    Types: Cigarettes    Quit date: 07/01/2021    Years since quitting: 0.2   Smokeless tobacco: Never  Vaping Use   Vaping Use: Never used  Substance and Sexual Activity   Alcohol use: No   Drug use: No   Sexual activity: Yes    Birth control/protection: None  Other Topics Concern   Not on file  Social History Narrative   Not on file   Social Determinants of Health   Financial Resource Strain: Not on file  Food Insecurity: Not on file  Transportation Needs: Not on file  Physical Activity: Not on file  Stress: Not on file  Social Connections: Not on file  Intimate Partner Violence: Not on file    Outpatient Medications Prior to Visit  Medication Sig Dispense Refill   Accu-Chek Softclix Lancets lancets Use as directed up to 4 times daily 100 each 5   Blood Glucose Monitoring Suppl (ACCU-CHEK GUIDE) w/Device KIT Use as directed 1 kit 0   glucose blood (ACCU-CHEK GUIDE) test strip Use as  instructed up to 4 times daily 100 each 12   HYDROcodone-acetaminophen (NORCO/VICODIN) 5-325 MG tablet Take 1 tablet by mouth every 4 (four) hours as needed for moderate pain. (Patient not taking: Reported on 09/21/2021) 30 tablet 0   insulin glargine (LANTUS SOLOSTAR) 100 UNIT/ML Solostar Pen Inject 60 Units into the skin daily. 15 mL 11   Insulin Pen Needle (TECHLITE PEN NEEDLES) 32G X 4 MM MISC Use as directed with insulin pens 100 each 3  Multiple Vitamins-Minerals (ADULT ONE DAILY GUMMIES PO) Take 1 capsule by mouth daily.     Pancrelipase, Lip-Prot-Amyl, (ZENPEP) 20000-63000 units CPEP Take 1 capsule by mouth 3 (three) times daily before meals. 180 capsule 4   rosuvastatin (CRESTOR) 10 MG tablet Take 1 tablet (10 mg total) by mouth daily. 90 tablet 1   insulin lispro (HUMALOG) 100 UNIT/ML KwikPen Inject 5 Units into the skin 3 (three) times daily before meals. (Patient taking differently: Inject 10 Units into the skin 3 (three) times daily before meals.) 6 mL 3   melatonin 3 MG TABS tablet Take 1 tablet (3 mg total) by mouth at bedtime as needed. 30 tablet 0   No facility-administered medications prior to visit.    Allergies  Allergen Reactions   Strawberry Extract Hives   Sulfa Antibiotics Hives and Itching    ROS Review of Systems  Constitutional:  Negative for chills and fever.  HENT: Negative.    Eyes: Negative.   Respiratory:  Positive for shortness of breath. Negative for cough and wheezing.   Cardiovascular:  Positive for leg swelling. Negative for chest pain.  Gastrointestinal: Negative.   Endocrine: Negative.   Genitourinary: Negative.   Musculoskeletal:  Positive for gait problem.  Skin:  Positive for wound. Negative for color change.  Allergic/Immunologic: Negative.   Neurological:  Negative for speech difficulty, weakness and headaches.  Hematological: Negative.   Psychiatric/Behavioral: Negative.       Objective:    Physical Exam Vitals and nursing note  reviewed.  Constitutional:      Appearance: Normal appearance. She is obese.  HENT:     Head: Normocephalic and atraumatic.     Right Ear: External ear normal.     Left Ear: External ear normal.     Nose: Nose normal.     Mouth/Throat:     Mouth: Mucous membranes are moist.     Pharynx: Oropharynx is clear.  Eyes:     Extraocular Movements: Extraocular movements intact.     Conjunctiva/sclera: Conjunctivae normal.     Pupils: Pupils are equal, round, and reactive to light.  Cardiovascular:     Rate and Rhythm: Normal rate and regular rhythm.     Pulses:          Dorsalis pedis pulses are 1+ on the right side.       Posterior tibial pulses are 1+ on the right side.     Heart sounds: Normal heart sounds.  Pulmonary:     Effort: Pulmonary effort is normal.     Breath sounds: Normal breath sounds.  Musculoskeletal:     Cervical back: Normal range of motion and neck supple.     Right lower leg: 1+ Edema present.  Feet:     Comments: Right great toe amputation, incision site healing well, non-purulent  Skin:    General: Skin is warm and dry.  Neurological:     General: No focal deficit present.     Mental Status: She is alert and oriented to person, place, and time.  Psychiatric:        Mood and Affect: Mood normal.        Behavior: Behavior normal.        Thought Content: Thought content normal.        Judgment: Judgment normal.    BP (!) 151/90   Pulse 80   Ht $R'5\' 4"'mj$  (1.626 m)   Wt 210 lb (95.3 kg)   SpO2 99%   BMI 36.05 kg/m  Wt Readings from Last 3 Encounters:  10/07/21 210 lb (95.3 kg)  09/08/21 180 lb (81.6 kg)  09/04/21 185 lb (83.9 kg)     Health Maintenance Due  Topic Date Due   COVID-19 Vaccine (3 - Pfizer risk series) 06/17/2020    There are no preventive care reminders to display for this patient.  Lab Results  Component Value Date   TSH 4.396 02/11/2020   Lab Results  Component Value Date   WBC 7.5 09/04/2021   HGB 9.9 (L) 09/04/2021   HCT  31.7 (L) 09/04/2021   MCV 85.9 09/04/2021   PLT 224 09/04/2021   Lab Results  Component Value Date   NA 136 09/04/2021   K 4.6 09/04/2021   CO2 20 (L) 09/04/2021   GLUCOSE 185 (H) 09/04/2021   BUN 23 (H) 09/04/2021   CREATININE 1.02 (H) 09/04/2021   BILITOT <0.2 07/30/2021   ALKPHOS 90 07/30/2021   AST 12 07/30/2021   ALT 7 07/30/2021   PROT 7.2 07/30/2021   ALBUMIN 3.4 (L) 07/30/2021   CALCIUM 9.0 09/04/2021   ANIONGAP 8 09/04/2021   EGFR 91 07/30/2021   Lab Results  Component Value Date   CHOL 98 (L) 06/18/2021   Lab Results  Component Value Date   HDL 37 (L) 06/18/2021   Lab Results  Component Value Date   LDLCALC 40 06/18/2021   Lab Results  Component Value Date   TRIG 117 06/18/2021   Lab Results  Component Value Date   CHOLHDL 2.6 06/18/2021   Lab Results  Component Value Date   HGBA1C 13.2 (H) 07/02/2021      Assessment & Plan:   Problem List Items Addressed This Visit       Cardiovascular and Mediastinum   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 block)   Relevant Medications   furosemide (LASIX) 20 MG tablet   Heart failure with mid-range ejection fraction (HCC)   Relevant Medications   furosemide (LASIX) 20 MG tablet   Elevated blood pressure reading with diagnosis of hypertension   Relevant Medications   furosemide (LASIX) 20 MG tablet     Endocrine   Type 2 diabetes mellitus (HCC) - Primary   Relevant Medications   insulin lispro (HUMALOG) 100 UNIT/ML KwikPen     Other   Insomnia   Relevant Medications   melatonin 3 MG TABS tablet   Localized edema   Relevant Medications   furosemide (LASIX) 20 MG tablet   Other Relevant Orders   Comprehensive metabolic panel   Brain natriuretic peptide    Meds ordered this encounter  Medications   insulin lispro (HUMALOG) 100 UNIT/ML KwikPen    Sig: Inject 10 Units into the skin 3 (three) times daily before meals.    Dispense:  6 mL    Refill:  3    Change in  dosing    Order Specific Question:   Supervising Provider    Answer:   Asencion Noble E [[id number]]   furosemide (LASIX) 20 MG tablet    Sig: Take 1 tablet (20 mg total) by mouth daily.    Dispense:  30 tablet    Refill:  3    Order Specific Question:   Supervising Provider    Answer:   Joya Gaskins, PATRICK E [[id number]]   melatonin 3 MG TABS tablet    Sig: Take 1 tablet (3 mg total) by mouth at bedtime as needed.    Dispense:  30 tablet    Refill:  0    Order Specific Question:   Supervising Provider    Answer:   Asencion Noble E [[id number]]  1. Localized edema Trial Lasix, patient education given on supportive care, encouraged low-sodium diet, keeping foot elevated when able.  Patient to follow-up with this provider in 1 week Red flags given for prompt reevaluation. - Comprehensive metabolic panel - furosemide (LASIX) 20 MG tablet; Take 1 tablet (20 mg total) by mouth daily.  Dispense: 30 tablet; Refill: 3 - Brain natriuretic peptide  2. Type 2 diabetes mellitus with stage 1 chronic kidney disease, with long-term current use of insulin (HCC)  - insulin lispro (HUMALOG) 100 UNIT/ML KwikPen; Inject 10 Units into the skin 3 (three) times daily before meals.  Dispense: 6 mL; Refill: 3  3. 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 block)   4. Heart failure with mid-range ejection fraction (HCC)   5. Elevated blood pressure reading with diagnosis of hypertension Patient encouraged to check blood pressure at home on a daily basis, keep a written log and have available for all office visits.  6. Insomnia, unspecified type Continue current regimen, refill per patient request - melatonin 3 MG TABS tablet; Take 1 tablet (3 mg total) by mouth at bedtime as needed.  Dispense: 30 tablet; Refill: 0    I have reviewed the patient's medical history (PMH, PSH, Social History, Family History, Medications, and allergies) , and have been updated if relevant. I spent 30 minutes reviewing chart  and  face to face time with patient.    Follow-up: Return in about 1 week (around 10/14/2021).    Loraine Grip Mayers, PA-C

## 2021-10-07 NOTE — Patient Instructions (Signed)
You are going to start taking Lasix once a day.  I encourage you to keep your left foot elevated as much as possible.  I encourage you to follow a low-sodium diet.  I encourage you to start checking your blood pressure at home on a daily basis, keep a written log and have available for all office visits.  We will call you to let you know your next appointment location.  We will call you with today's lab results. Kennieth Rad, PA-C Physician Assistant [facility] [email or web address]    Edema Edema is an abnormal buildup of fluids in the body tissues and under the skin. Swelling of the legs, feet, and ankles is a common symptom that becomes more likely as you get older. Swelling is also common in looser tissues, like around the eyes. When the affected area is squeezed, the fluid may move out of that spot and leave a dent for a few moments. This dent is called pitting edema. There are many possible causes of edema. Eating too much salt (sodium) and being on your feet or sitting for a long time can cause edema in your legs, feet, and ankles. Hot weather may make edema worse. Common causes of edema include: Heart failure. Liver or kidney disease. Weak leg blood vessels. Cancer. An injury. Pregnancy. Medicines. Being obese. Low protein levels in the blood. Edema is usually painless. Your skin may look swollen or shiny. Follow these instructions at home: Keep the affected body part raised (elevated) above the level of your heart when you are sitting or lying down. Do not sit still or stand for long periods of time. Do not wear tight clothing. Do not wear garters on your upper legs. Exercise your legs to get your circulation going. This helps to move the fluid back into your blood vessels, and it may help the swelling go down. Wear elastic bandages or support stockings to reduce swelling as told by your health care provider. Eat a  low-salt (low-sodium) diet to reduce fluid as told by your health care provider. Depending on the cause of your swelling, you may need to limit how much fluid you drink (fluid restriction). Take over-the-counter and prescription medicines only as told by your health care provider. Contact a health care provider if: Your edema does not get better with treatment. You have heart, liver, or kidney disease and have symptoms of edema. You have sudden and unexplained weight gain. Get help right away if: You develop shortness of breath or chest pain. You cannot breathe when you lie down. You develop pain, redness, or warmth in the swollen areas. You have heart, liver, or kidney disease and suddenly get edema. You have a fever and your symptoms suddenly get worse. Summary Edema is an abnormal buildup of fluids in the body tissues and under the skin. Eating too much salt (sodium) and being on your feet or sitting for a long time can cause edema in your legs, feet, and ankles. Keep the affected body part raised (elevated) above the level of your heart when you are sitting or lying down. This information is not intended to replace advice given to you by your health care provider. Make sure you discuss any questions you have with your health care provider. Document Revised: 09/30/2020 Document Reviewed: 09/16/2020 Elsevier Patient Education  2022 [employer].

## 2021-10-08 NOTE — Therapy (Signed)
[facility] [address] [place], [place], [address] Phone: [phone]   Fax:  [phone]  Physical Therapy Evaluation  Patient Details  Name: Sarah Long MRN: [id number] Date of Birth: Feb 28, 1971 Referring Provider (PT): Dr. Sharol Given   Encounter Date: 10/07/2021   PT End of Session - 10/07/21 2355     Visit Number 1    Number of Visits 17    Date for PT Re-Evaluation 12/02/21    Authorization Type CCME    Authorization - Visit Number 1    Authorization - Number of Visits 4    Progress Note Due on Visit 4    PT Start Time 3710    PT Stop Time 6269    PT Time Calculation (min) 45 min    Equipment Utilized During Treatment Gait belt    Activity Tolerance Patient tolerated treatment well    Behavior During Therapy [facility] for tasks assessed/performed             Past Medical History:  Diagnosis Date   Abnormal Pap smear 1998   Abnormal vaginal bleeding 12/19/2011   Acute urinary retention 06/26/2021   Arthritis    Bacterial infection    Bipolar 1 disorder ([place])    Blister of second toe of left foot 11/21/2016   Candida vaginitis 07/2007   Depression    recently added wellbutrin-has not taken yet for bipolar   Diabetes in pregnancy    Diabetes mellitus    nph 20U qam and qpm, regular with meals   Diabetic ketoacidosis without coma associated with type 2 diabetes mellitus ([place])    Fibroid    Galactorrhea of right breast 2008   GERD (gastroesophageal reflux disease)    H/O amenorrhea 07/2007   H/O dizziness 10/14/2011   H/O dysmenorrhea 2010   H/O menorrhagia 10/14/2011   H/O varicella    Headache(784.0)    Heavy vaginal bleeding due to contraceptive injection use 10/12/2011   Depo Provera   Herpes    HSV-2 infection 01/03/2009   Hx: UTI (urinary tract infection) 2009   Hypertension    on aldomet   Increased BMI 2010   Irregular uterine bleeding 04/04/2012   Pt has mirena    Obesity 10/14/2011    Oligomenorrhea 07/2007   Pelvic pain in female    Presence of permanent cardiac pacemaker    Preterm labor    Trichomonas    Yeast infection     Past Surgical History:  Procedure Laterality Date   ABDOMINAL AORTOGRAM W/LOWER EXTREMITY N/A 06/17/2021   Procedure: ABDOMINAL AORTOGRAM W/LOWER EXTREMITY;  Surgeon: Wellington Hampshire, MD;  Location: [place];  Service: Cardiovascular;  Laterality: N/A;   AMPUTATION Left 06/27/2021   Procedure: AMPUTATION BELOW KNEE;  Surgeon: Newt Minion, MD;  Location: [facility];  Service: Orthopedics;  Laterality: Left;   AMPUTATION Right 09/04/2021   Procedure: RIGHT GREAT TOE AMPUTATION;  Surgeon: Newt Minion, MD;  Location: [facility];  Service: Orthopedics;  Laterality: Right;   CESAREAN SECTION  1991   LEFT HEART CATH AND CORONARY ANGIOGRAPHY N/A 02/10/2020   Procedure: LEFT HEART CATH AND CORONARY ANGIOGRAPHY;  Surgeon: Troy Sine, MD;  Location: [place];  Service: Cardiovascular;  Laterality: N/A;   PACEMAKER IMPLANT N/A 02/13/2020   Procedure: PACEMAKER IMPLANT;  Surgeon: Constance Haw, MD;  Location: [place] [place] [facility];  Service: Cardiovascular;  Laterality: N/A;   TEMPORARY PACEMAKER N/A 02/10/2020   Procedure:  TEMPORARY PACEMAKER;  Surgeon: Troy Sine, MD;  Location: [facility];  Service: Cardiovascular;  Laterality: N/A;    There were no vitals filed for this visit.    Subjective Assessment - 10/07/21 1231     Subjective status post a left transtibial amputation 3 months ago and 3 weeks status post a right great toe amputation. Received prosthesis from [facility] on 09/24/21.    Pertinent History L BKA, R big toe amputation    Limitations Standing;Walking;House hold activities    Currently in Pain? Yes    Pain Score 6     Pain Location Leg    Pain Orientation Left    Pain Descriptors / Indicators Sharp    Pain Type Neuropathic pain    Pain Onset More than a month ago    Pain Frequency Constant                 [facility] PT Assessment - 10/07/21 1301       Assessment   Medical Diagnosis L BKA    Referring Provider (PT) Dr. Sharol Given    Onset Date/Surgical Date 06/27/21      Precautions   Precautions Fall      Balance Screen   Has the patient fallen in the past 6 months Yes    How many times? 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 Requires more than 7 sec to ambulate 20 ft.    Change in Gait Speed Able to change speed, demonstrates mild gait deviations, deviates 6-10 in outside of the 12 in walkway width, or no gait deviations, unable to achieve a major change in velocity, or uses a change in velocity, or uses an assistive device.    Gait with Horizontal Head Turns Performs head turns with moderate changes in gait velocity, slows down, deviates 10-15 in outside 12 in walkway width but recovers, can continue to walk.    Gait with Vertical Head Turns Performs task with moderate change in gait velocity, slows down, deviates 10-15 in outside 12 in walkway width but recovers, can continue to walk.    Gait and Pivot Turn Turns slowly, requires verbal cueing, or requires several small steps to catch balance following turn and stop    Step Over Obstacle Is able to step over one shoe box (4.5 in total height) but must slow down and  adjust steps to clear box safely. May require verbal cueing.    Gait with Narrow Base of Support Ambulates less than 4 steps heel to toe or cannot perform without assistance.    Gait with Eyes Closed Walks 20 ft, slow speed, abnormal gait pattern, evidence for imbalance,  deviates 10-15 in outside 12 in walkway width. Requires more than 9 sec to ambulate 20 ft.    Ambulating Backwards Walks 20 ft, slow speed, abnormal gait pattern, evidence for imbalance, deviates 10-15 in outside 12 in walkway width.    Steps Two feet to a stair, must use rail.    Total Score 10                        Objective measurements completed on examination: See above findings.                PT Education - 10/07/21 2355     Education Details Sweat management: Educated pt on keeping clean wash clothes with her and wiping sweat off residual leg at least every 2 hours and inner liner to maintain dry environment and prevent blisters. Pt educated on not putting lotion on residual leg during the day. she can apply it at night but clean it in morning. Sock management: pt educated on getting 8-65 clicks when she stands. Keeping 1 blue, 2 green and 3-4 yellow ply socks with her all the time so she can adjust for residual leg volume changes throughtout the day. Prosthetic management: pt educated on cleaning inside of her socket with antibacterial wipe and cleaning liner with mild antibacterial soap and water and letting it air dry at night. Skin check: educated pt to have small handheld mirror where she can perform skin check at least 3-4 times a day.    Person(s) Educated Patient    Methods Explanation    Comprehension Verbalized understanding;Need further instruction              PT Short Term Goals - 10/08/21 1539       PT SHORT TERM GOAL #1   Title Patient will verbalize proper instructions for sweat management, sock management, residual limb and prosthetic leg care to improve functional  independence with prosthetic leg.    Baseline Education initiated (10/07/21)    Time 4    Period Weeks    Status New    Target Date 11/05/21      PT SHORT TERM GOAL #2   Title Patient will be able to go up and down curb step without AD and SBA to improve community access    Baseline not attempted    Time 4    Period Weeks    Status New    Target Date 11/05/21               PT Long Term Goals - 10/08/21 1551       PT LONG TERM GOAL #1   Title Patient will demo 20/30 on FGA to improve functional gait and ambulation    Baseline 10/30 (10/07/21)    Time 8    Period Weeks    Status New    Target Date 12/02/21      PT LONG TERM GOAL #2   Title Pt will demo at least 0.56m/s gait speed without AD to improve commnity ambulation    Baseline 0.5 m/s (10/07/21)    Time 8    Period Weeks    Status New    Target Date 12/02/21                    Plan - 10/08/21 1553     Clinical Impression Statement Patient is a 50 y.o. female who was seen today for physical therapy evaluation and treatment for gait  and mobility disorder after L BKA. Patient received her prosthesis about 3 weeks ago and she is currently ambulating without any AD with decreased gait quality. Patient is currently able to tolerate wearing prosthetic leg for majority of the day without any skin irritation of residual leg. Residucal leg skin is in well nourished and healthy. Patient demonstrates gait abnormalitiy, decreased fucntional strength, decreased functional balance. Patient will benefit from skilled PT to address these impairments and improve overall function.    Personal Factors and Comorbidities Past/Current Experience;Comorbidity 2    Comorbidities L BKA, R big toe amputation    Examination-Activity Limitations Lift;Squat;Stairs;Stand    Examination-Participation Restrictions Cleaning;Community Activity;Occupation;Shop;Yard Work    Merchant [employer] officer Stable/Uncomplicated    Clinical  Decision Making Low    Rehab Potential Good    PT Frequency 2x / week    PT Duration 8 weeks    PT Treatment/Interventions ADLs/Self Care Home Management;Cryotherapy;Moist Heat;Biofeedback;Gait training;Stair training;Functional mobility training;Therapeutic activities;Therapeutic exercise;Balance training;Manual techniques;Prosthetic Training;Orthotic Fit/Training;Patient/family education;Neuromuscular re-education;Passive range of motion;Scar mobilization;Energy conservation;Joint Manipulations    PT Next Visit Plan Continue with prosthetic education, sock management, sweat management. Work on Veterinary surgeon with Plan of Care Patient             Patient will benefit from skilled therapeutic intervention in order to improve the following deficits and impairments:  Abnormal gait, Decreased activity tolerance, Decreased balance, Decreased coordination, Decreased range of motion, Decreased mobility, Decreased endurance, Decreased skin integrity, Decreased strength, Difficulty walking, Impaired flexibility, Increased edema, Impaired sensation, Postural dysfunction, Pain, Prosthetic Dependency  Visit Diagnosis: Left below-knee amputee ([place])  Other abnormalities of gait and mobility  Muscle weakness (generalized)     Problem List Patient Active Problem List   Diagnosis Date Noted   Elevated blood pressure reading with diagnosis of hypertension 10/07/2021   Insomnia 10/07/2021   Localized edema 10/07/2021   Right toe amputee ([place])    Left below-knee amputee ([place]) 07/01/2021   Protein-calorie malnutrition ([place]) 06/26/2021   Hypoalbuminemia due to protein-calorie malnutrition ([place]) 06/26/2021   Normocytic anemia 06/26/2021   Heart failure with mid-range ejection fraction ([place]) 06/26/2021   Diabetic nephropathy with proteinuria ([place]) 06/26/2021   Diabetic foot ulcer ([place]) 05/26/2021   Bacterial vaginosis 07/17/2020   Dental caries 04/01/2020    Cracked tooth 04/01/2020   Alcohol use 03/11/2020   Vitamin D deficiency 03/11/2020   Prolonged QT interval 02/26/2020   Chronic combined systolic and diastolic CHF (congestive heart failure) ([place]) 02/26/2020   Heart block AV complete ([place]) 02/11/2020   Exocrine pancreatic insufficiency    Dysmenorrhea 10/02/2019   DM type 2 with diabetic peripheral neuropathy ([place]) 05/30/2019   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 block) 05/29/2019   Bilateral bunions 05/29/2019   Type 2 diabetes mellitus ([place]) 10/21/2016   Smoking 11/26/2013   Hypercholesteremia 02/02/2007   Bipolar 1 disorder ([place]) 02/02/2007   HYPERTENSION, BENIGN SYSTEMIC 02/02/2007    Kerrie Pleasure, PT 10/08/2021, 4:03 PM  [facility] [address] [place], [place], [address] Phone: [phone]   Fax:  [phone]  Name: CAROL THEYS MRN: [id number] Date of Birth: 02/12/1971

## 2021-10-09 ENCOUNTER — Ambulatory Visit: Payer: Medicaid Other

## 2021-10-13 ENCOUNTER — Ambulatory Visit: Payer: Self-pay | Admitting: *Deleted

## 2021-10-13 LAB — COMPREHENSIVE METABOLIC PANEL
ALT: 6 IU/L (ref 0–32)
AST: 17 IU/L (ref 0–40)
Albumin/Globulin Ratio: 1.1 — ABNORMAL LOW (ref 1.2–2.2)
Albumin: 3.8 g/dL (ref 3.8–4.8)
Alkaline Phosphatase: 87 IU/L (ref 44–121)
BUN/Creatinine Ratio: 18 (ref 9–23)
BUN: 19 mg/dL (ref 6–24)
Bilirubin Total: <0.2 mg/dL (ref 0.0–1.2)
CO2: 16 mmol/L — ABNORMAL LOW (ref 20–29)
Calcium: 9.3 mg/dL (ref 8.7–10.2)
Chloride: 106 mmol/L (ref 96–106)
Creatinine, Ser: 1.07 mg/dL — ABNORMAL HIGH (ref 0.57–1.00)
Globulin, Total: 3.4 g/dL (ref 1.5–4.5)
Glucose: 70 mg/dL (ref 70–99)
Potassium: 5.1 mmol/L (ref 3.5–5.2)
Sodium: 138 mmol/L (ref 134–144)
Total Protein: 7.2 g/dL (ref 6.0–8.5)
eGFR: 64 mL/min/{1.73_m2} (ref >59)

## 2021-10-13 LAB — BRAIN NATRIURETIC PEPTIDE: BNP: 271.1 pg/mL — ABNORMAL HIGH (ref 0.0–100.0)

## 2021-10-13 NOTE — Telephone Encounter (Signed)
Patient called, left VM to return the call to the office to speak to a nurse. 

## 2021-10-13 NOTE — Telephone Encounter (Signed)
Connection was so bad- advised patient I will call her back- patient did not answer when called back. Left message for patient to call back.

## 2021-10-13 NOTE — Telephone Encounter (Signed)
Answer Assessment - Initial Assessment Questions 1. SYMPTOMS: "What symptoms are you concerned about?"     Fasting 54, before lunch 67, presently 127- light headed before lunch 2. ONSET:  "When did the symptoms start?"     no 3. BLOOD GLUCOSE: "What is your blood glucose level?"      127 4. USUAL RANGE: "What is your blood glucose level usually?" (e.g., usual fasting morning value, usual evening value)     *No Answer* 5. TYPE 1 or 2:  "Do you know what type of diabetes you have?"  (e.g., Type 1, Type 2, Gestational; doesn't know)      *No Answer* 6. INSULIN: "Do you take insulin?" "What type of insulin(s) do you use? What is the mode of delivery? (syringe, pen; injection or pump) "When did you last give yourself an insulin dose?" (i.e., time or hours/minutes ago) "How much did you give?" (i.e., how many units)     *No Answer* 7. DIABETES PILLS: "Do you take any pills for your diabetes?"     *No Answer* 8. OTHER SYMPTOMS: "Do you have any symptoms?" (e.g., fever, frequent urination, difficulty breathing, vomiting)     *No Answer* 9. LOW BLOOD GLUCOSE TREATMENT: "What have you done so far to treat the low blood glucose level?"     *No Answer* 10. FOOD: "When did you last eat or drink?"       *No Answer* 11. ALONE: "Are you alone right now or is someone with you?"        *No Answer* 12. PREGNANCY: "Is there any chance you are pregnant?" "When was your last menstrual period?"       *No Answer*  Protocols used: Diabetes - Low Blood Sugar-A-AH

## 2021-10-13 NOTE — Telephone Encounter (Signed)
Patient returned call to report she is feeling better. Last blood glucose 127 at 2:45pm. Patient is currently at work and reports she is able to work and denies symptoms of hypoglycemia. Reports taking insulin novolog 10 units with meals and blood glucose has been dropping. Reviewed with patient order is to take insulin lispro humalog 10 units with meals.  Patient reports she thinks she has a different colored insulin pen orange and blue. Normally patient receivers a dark blue and maroon colored insulin pen. Patient reports she will not be eating much tonight. Instructed patient to recheck blood glucose prior to bed tonight.  Instructed patient to bring medications to OV tomorrow to review with PCP. Patient triaged earlier today for hypoglycemia. Care advise given. Patient verbalized understanding of care advise and to call back or go to ED if symptoms worsen.      Reason for Disposition  Diabetes drug error or overdose (e.g., insulin error or extra dose)  Protocols used: Diabetes - Low Blood Sugar-A-AH

## 2021-10-14 ENCOUNTER — Ambulatory Visit: Payer: Medicaid Other | Attending: Physician Assistant | Admitting: Physician Assistant

## 2021-10-14 ENCOUNTER — Other Ambulatory Visit: Payer: Self-pay

## 2021-10-14 ENCOUNTER — Ambulatory Visit: Payer: Medicaid Other | Admitting: Physical Therapy

## 2021-10-14 ENCOUNTER — Encounter: Payer: Self-pay | Admitting: Physical Therapy

## 2021-10-14 ENCOUNTER — Encounter: Payer: Self-pay | Admitting: Physician Assistant

## 2021-10-14 VITALS — BP 166/86 | HR 102 | Temp 98.8°F | Resp 18 | Ht 64.0 in | Wt 211.0 lb

## 2021-10-14 DIAGNOSIS — R600 Localized edema: Secondary | ICD-10-CM | POA: Diagnosis not present

## 2021-10-14 DIAGNOSIS — I10 Essential (primary) hypertension: Secondary | ICD-10-CM | POA: Diagnosis not present

## 2021-10-14 DIAGNOSIS — I5042 Chronic combined systolic (congestive) and diastolic (congestive) heart failure: Secondary | ICD-10-CM

## 2021-10-14 DIAGNOSIS — Z794 Long term (current) use of insulin: Secondary | ICD-10-CM | POA: Diagnosis not present

## 2021-10-14 DIAGNOSIS — E1122 Type 2 diabetes mellitus with diabetic chronic kidney disease: Secondary | ICD-10-CM

## 2021-10-14 DIAGNOSIS — N181 Chronic kidney disease, stage 1: Secondary | ICD-10-CM

## 2021-10-14 DIAGNOSIS — F17200 Nicotine dependence, unspecified, uncomplicated: Secondary | ICD-10-CM

## 2021-10-14 DIAGNOSIS — M6281 Muscle weakness (generalized): Secondary | ICD-10-CM

## 2021-10-14 DIAGNOSIS — Z6836 Body mass index (BMI) 36.0-36.9, adult: Secondary | ICD-10-CM

## 2021-10-14 DIAGNOSIS — R2689 Other abnormalities of gait and mobility: Secondary | ICD-10-CM

## 2021-10-14 DIAGNOSIS — Z89512 Acquired absence of left leg below knee: Secondary | ICD-10-CM | POA: Diagnosis not present

## 2021-10-14 DIAGNOSIS — E162 Hypoglycemia, unspecified: Secondary | ICD-10-CM | POA: Diagnosis not present

## 2021-10-14 LAB — POCT GLYCOSYLATED HEMOGLOBIN (HGB A1C): Hemoglobin A1C: 7.4 % — AB (ref 4.0–5.6)

## 2021-10-14 MED ORDER — CARVEDILOL 6.25 MG PO TABS
6.2500 mg | ORAL_TABLET | Freq: Two times a day (BID) | ORAL | 3 refills | Status: DC
  Filled 2021-10-14: qty 60, 30d supply, fill #0

## 2021-10-14 MED ORDER — GLUCOSE 4 G PO CHEW
1.0000 | CHEWABLE_TABLET | ORAL | 12 refills | Status: DC | PRN
Start: 2021-10-14 — End: 2021-11-02
  Filled 2021-10-14: qty 50, fill #0

## 2021-10-14 NOTE — Progress Notes (Signed)
Patient has not eaten or taken medication today. Patient denies pain at this time.

## 2021-10-14 NOTE — Progress Notes (Signed)
That she had  Established Patient Office Visit  Subjective:  Patient ID: Sarah Long, female    DOB: 05-Mar-1971  Age: 50 y.o. MRN: [id number]  CC:  Chief Complaint  Patient presents with   Edema    HPI ARIS MOMAN reports that she has been taking the Lasix as directed, states that she has noticed an improvement in her edema, however she does have some swelling still present.  States that she has been having hypoglycemic episodes.  Reports that her blood glucose level yesterday morning fasting was 54.  Reports that she did not have anything to eat but did check her blood glucose level again when she got to work and it was in the low 60s.  Reports that she did have some food with improvement of her feelings of dizziness and lightheadedness.  Reports that she has been compliant to Lantus 60 units and NovoLog 10 units 3 times a day, states that she does occasionally skip meals, especially in the morning.     Past Medical History:  Diagnosis Date   Abnormal Pap smear 1998   Abnormal vaginal bleeding 12/19/2011   Acute urinary retention 06/26/2021   Arthritis    Bacterial infection    Bipolar 1 disorder (HCC)    Blister of second toe of left foot 11/21/2016   Candida vaginitis 07/2007   Depression    recently added wellbutrin-has not taken yet for bipolar   Diabetes in pregnancy    Diabetes mellitus    nph 20U qam and qpm, regular with meals   Diabetic ketoacidosis without coma associated with type 2 diabetes mellitus ([place])    Fibroid    Galactorrhea of right breast 2008   GERD (gastroesophageal reflux disease)    H/O amenorrhea 07/2007   H/O dizziness 10/14/2011   H/O dysmenorrhea 2010   H/O menorrhagia 10/14/2011   H/O varicella    Headache(784.0)    Heavy vaginal bleeding due to contraceptive injection use 10/12/2011   Depo Provera   Herpes    HSV-2 infection 01/03/2009   Hx: UTI (urinary tract infection) 2009   Hypertension    on aldomet   Increased BMI 2010    Irregular uterine bleeding 04/04/2012   Pt has mirena    Obesity 10/14/2011   Oligomenorrhea 07/2007   Pelvic pain in female    Presence of permanent cardiac pacemaker    Preterm labor    Trichomonas    Yeast infection     Past Surgical History:  Procedure Laterality Date   ABDOMINAL AORTOGRAM W/LOWER EXTREMITY N/A 06/17/2021   Procedure: ABDOMINAL AORTOGRAM W/LOWER EXTREMITY;  Surgeon: Wellington Hampshire, MD;  Location: [facility];  Service: Cardiovascular;  Laterality: N/A;   AMPUTATION Left 06/27/2021   Procedure: AMPUTATION BELOW KNEE;  Surgeon: Newt Minion, MD;  Location: [place];  Service: Orthopedics;  Laterality: Left;   AMPUTATION Right 09/04/2021   Procedure: RIGHT GREAT TOE AMPUTATION;  Surgeon: Newt Minion, MD;  Location: [place];  Service: Orthopedics;  Laterality: Right;   CESAREAN SECTION  1991   LEFT HEART CATH AND CORONARY ANGIOGRAPHY N/A 02/10/2020   Procedure: LEFT HEART CATH AND CORONARY ANGIOGRAPHY;  Surgeon: Troy Sine, MD;  Location: [facility];  Service: Cardiovascular;  Laterality: N/A;   PACEMAKER IMPLANT N/A 02/13/2020   Procedure: PACEMAKER IMPLANT;  Surgeon: Constance Haw, MD;  Location: [place] [facility];  Service: Cardiovascular;  Laterality: N/A;   TEMPORARY PACEMAKER N/A 02/10/2020  Procedure: TEMPORARY PACEMAKER;  Surgeon: Lennette Bihari, MD;  Location: [facility];  Service: Cardiovascular;  Laterality: N/A;    Family History  Problem Relation Age of Onset   Hypertension Mother    Diabetes Mother    Heart disease Mother    Hypertension Father    Diabetes Father    Heart disease Father    Stroke Maternal Grandfather    Other Neg Hx     Social History   Socioeconomic History   Marital status: Married    Spouse name: Not on file   Number of children: Not on file   Years of education: Not on file   Highest education level: Not on file  Occupational History   Not on file  Tobacco Use   Smoking status:  Former    Packs/day: 0.50    Years: 20.00    Pack years: 10.00    Types: Cigarettes    Quit date: 07/01/2021    Years since quitting: 0.2   Smokeless tobacco: Never  Vaping Use   Vaping Use: Never used  Substance and Sexual Activity   Alcohol use: No   Drug use: No   Sexual activity: Yes    Birth control/protection: None  Other Topics Concern   Not on file  Social History Narrative   Not on file   Social Determinants of Health   Financial Resource Strain: Not on file  Food Insecurity: Not on file  Transportation Needs: Not on file  Physical Activity: Not on file  Stress: Not on file  Social Connections: Not on file  Intimate Partner Violence: Not on file    Outpatient Medications Prior to Visit  Medication Sig Dispense Refill   Accu-Chek Softclix Lancets lancets Use as directed up to 4 times daily 100 each 5   Blood Glucose Monitoring Suppl (ACCU-CHEK GUIDE) w/Device KIT Use as directed 1 kit 0   furosemide (LASIX) 20 MG tablet Take 1 tablet (20 mg total) by mouth daily. 30 tablet 3   glucose blood (ACCU-CHEK GUIDE) test strip Use as instructed up to 4 times daily 100 each 12   HYDROcodone-acetaminophen (NORCO/VICODIN) 5-325 MG tablet Take 1 tablet by mouth every 4 (four) hours as needed for moderate pain. (Patient not taking: Reported on 09/21/2021) 30 tablet 0   insulin glargine (LANTUS SOLOSTAR) 100 UNIT/ML Solostar Pen Inject 60 Units into the skin daily. 15 mL 11   insulin lispro (HUMALOG) 100 UNIT/ML KwikPen Inject 10 Units into the skin 3 (three) times daily before meals. 6 mL 3   Insulin Pen Needle (TECHLITE PEN NEEDLES) 32G X 4 MM MISC Use as directed with insulin pens 100 each 3   melatonin 3 MG TABS tablet Take 1 tablet (3 mg total) by mouth at bedtime as needed. 30 tablet 0   Multiple Vitamins-Minerals (ADULT ONE DAILY GUMMIES PO) Take 1 capsule by mouth daily.     Pancrelipase, Lip-Prot-Amyl, (ZENPEP) 20000-63000 units CPEP Take 1 capsule by mouth 3 (three)  times daily before meals. 180 capsule 4   rosuvastatin (CRESTOR) 10 MG tablet Take 1 tablet (10 mg total) by mouth daily. 90 tablet 1   No facility-administered medications prior to visit.    Allergies  Allergen Reactions   Strawberry Extract Hives   Sulfa Antibiotics Hives and Itching    ROS Review of Systems  Constitutional: Negative.   HENT: Negative.    Eyes: Negative.   Respiratory:  Negative for shortness of breath.   Cardiovascular:  Positive for leg swelling. Negative for chest pain.  Gastrointestinal:  Negative for nausea and vomiting.  Endocrine: Negative.   Genitourinary: Negative.   Musculoskeletal: Negative.   Skin: Negative.   Allergic/Immunologic: Negative.   Neurological:  Positive for dizziness and weakness.  Hematological: Negative.   Psychiatric/Behavioral: Negative.       Objective:    Physical Exam Vitals and nursing note reviewed.  Constitutional:      Appearance: Normal appearance.  HENT:     Head: Normocephalic and atraumatic.     Right Ear: External ear normal.     Left Ear: External ear normal.     Nose: Nose normal.     Mouth/Throat:     Mouth: Mucous membranes are moist.     Pharynx: Oropharynx is clear.  Eyes:     Extraocular Movements: Extraocular movements intact.     Conjunctiva/sclera: Conjunctivae normal.     Pupils: Pupils are equal, round, and reactive to light.  Cardiovascular:     Rate and Rhythm: Normal rate and regular rhythm.     Pulses: Normal pulses.     Heart sounds: Normal heart sounds.  Pulmonary:     Effort: Pulmonary effort is normal.     Breath sounds: Normal breath sounds.  Musculoskeletal:        General: Normal range of motion.     Cervical back: Normal range of motion and neck supple.     Right lower leg: Edema present.     Comments: Wearing prosthesis Slight nonpitting edema right lower leg  Skin:    General: Skin is warm and dry.  Neurological:     General: No focal deficit present.     Mental  Status: She is alert and oriented to person, place, and time.  Psychiatric:        Mood and Affect: Mood normal.        Behavior: Behavior normal.        Thought Content: Thought content normal.        Judgment: Judgment normal.    BP (!) 166/86 (BP Location: Left Arm, Patient Position: Sitting, Cuff Size: Normal)   Pulse (!) 102   Temp 98.8 F (37.1 C) (Oral)   Resp 18   Ht $R'5\' 4"'ZF$  (1.626 m)   Wt 211 lb (95.7 kg)   SpO2 100%   BMI 36.22 kg/m  Wt Readings from Last 3 Encounters:  10/14/21 211 lb (95.7 kg)  10/07/21 210 lb (95.3 kg)  09/08/21 180 lb (81.6 kg)     Health Maintenance Due  Topic Date Due   COVID-19 Vaccine (3 - Pfizer risk series) 06/17/2020    There are no preventive care reminders to display for this patient.  Lab Results  Component Value Date   TSH 4.396 02/11/2020   Lab Results  Component Value Date   WBC 7.5 09/04/2021   HGB 9.9 (L) 09/04/2021   HCT 31.7 (L) 09/04/2021   MCV 85.9 09/04/2021   PLT 224 09/04/2021   Lab Results  Component Value Date   NA 140 10/14/2021   K 4.6 10/14/2021   CO2 22 10/14/2021   GLUCOSE 162 (H) 10/14/2021   BUN 18 10/14/2021   CREATININE 0.93 10/14/2021   BILITOT <0.2 10/07/2021   ALKPHOS 87 10/07/2021   AST 17 10/07/2021   ALT 6 10/07/2021   PROT 7.2 10/07/2021   ALBUMIN 3.8 10/07/2021   CALCIUM 9.0 10/14/2021   ANIONGAP 8 09/04/2021   EGFR 76 10/14/2021   Lab  Results  Component Value Date   CHOL 98 (L) 06/18/2021   Lab Results  Component Value Date   HDL 37 (L) 06/18/2021   Lab Results  Component Value Date   LDLCALC 40 06/18/2021   Lab Results  Component Value Date   TRIG 117 06/18/2021   Lab Results  Component Value Date   CHOLHDL 2.6 06/18/2021   Lab Results  Component Value Date   HGBA1C 7.4 (A) 10/14/2021      Assessment & Plan:   Problem List Items Addressed This Visit       Cardiovascular and Mediastinum   Chronic combined systolic and diastolic CHF (congestive heart  failure) (HCC)   Relevant Medications   carvedilol (COREG) 6.25 MG tablet   Other Relevant Orders   Brain natriuretic peptide (Completed)   Elevated blood pressure reading with diagnosis of hypertension   Relevant Medications   carvedilol (COREG) 6.25 MG tablet     Endocrine   Type 2 diabetes mellitus (HCC)   Relevant Medications   glucose 4 GM chewable tablet   Other Relevant Orders   POCT A1C (Completed)   Basic Metabolic Panel (Completed)   Hypoglycemia   Relevant Medications   glucose 4 GM chewable tablet     Other   Smoking   Localized edema - Primary   Class 2 severe obesity due to excess calories with serious comorbidity and body mass index (BMI) of 36.0 to 36.9 in adult [facility])   Relevant Medications   glucose 4 GM chewable tablet    Meds ordered this encounter  Medications   carvedilol (COREG) 6.25 MG tablet    Sig: Take 1 tablet (6.25 mg total) by mouth 2 (two) times daily with a meal.    Dispense:  60 tablet    Refill:  3    Order Specific Question:   Supervising Provider    Answer:   Joya Gaskins, PATRICK E [[id number]]   glucose 4 GM chewable tablet    Sig: Chew 1 tablet (4 g total) by mouth as needed for low blood sugar.    Dispense:  50 tablet    Refill:  12    Order Specific Question:   Supervising Provider    Answer:   Asencion Noble E [[id number]]  1. Localized edema Continue Lasix, continue low-sodium diet, keeping feet elevated when possible  2. Hypoglycemia Patient education given on preventing hypoglycemic incidents, reduce Lantus 40 units.  Patient encouraged to continue checking blood glucose levels twice daily, keeping a written log, bring it to next office visit in 1 month with Dr. Joya Gaskins.  Red flags given for prompt reevaluation.  Patient strongly encouraged to work on improving nutrition and not skipping meals - glucose 4 GM chewable tablet; Chew 1 tablet (4 g total) by mouth as needed for low blood sugar.  Dispense: 50 tablet; Refill: 12  3. Chronic combined  systolic and diastolic CHF (congestive heart failure) (HCC) Trial carvedilol.  Patient encouraged to check blood pressure at home on a daily basis, keep a written log and have available for all office visits. - carvedilol (COREG) 6.25 MG tablet; Take 1 tablet (6.25 mg total) by mouth 2 (two) times daily with a meal.  Dispense: 60 tablet; Refill: 3 - Brain natriuretic peptide  4. Type 2 diabetes mellitus with stage 1 chronic kidney disease, with long-term current use of insulin (HCC) A1c 7.4 congratulated patient on compliance.  A1c 3 months ago was 13.2. - POCT C1E - Basic Metabolic Panel  5.  Elevated blood pressure reading with diagnosis of hypertension  - carvedilol (COREG) 6.25 MG tablet; Take 1 tablet (6.25 mg total) by mouth 2 (two) times daily with a meal.  Dispense: 60 tablet; Refill: 3  6. Smoking Once again reviewed smoking cessation tips with patient.   I have reviewed the patient's medical history (PMH, PSH, Social History, Family History, Medications, and allergies) , and have been updated if relevant. I spent 30 minutes reviewing chart and  face to face time with patient.     Follow-up: Return in about 4 weeks (around 11/11/2021) for with Dr. Joya Gaskins .    Loraine Grip Mayers, PA-C

## 2021-10-14 NOTE — Patient Instructions (Signed)
You are going to start taking carvedilol twice daily.  Continue taking the Lasix as well.  I encourage you to keep checking your blood pressure on a daily basis, keeping a written log and having available for your follow-up with Dr. Joya Gaskins in 1 month.  You are going to reduce your Lantus to 40 units at bedtime.  Continue checking your blood sugar levels, keeping a written log and bring that with you to your next office visit.  Keep up the great work!  Kennieth Rad, PA-C Physician Assistant [facility] [email or web address]   Hypoglycemia Hypoglycemia occurs when the level of sugar (glucose) in the blood is too low. Hypoglycemia can happen in people who have or do not have diabetes. It can develop quickly, and it can be a medical emergency. For most people, a blood glucose level below 70 mg/dL (3.9 mmol/L) is considered hypoglycemia. Glucose is a type of sugar that provides the body's main source of energy. Certain hormones (insulin and glucagon) control the level of glucose in the blood. Insulin lowers blood glucose, and glucagon raises blood glucose. Hypoglycemia can result from having too much insulin in the bloodstream, or from not eating enough food that contains glucose. You may also have reactive hypoglycemia, which happens within 4 hours after eating a meal. What are the causes? Hypoglycemia occurs most often in people who have diabetes and may be caused by: Diabetes medicine. Not eating enough, or not eating often enough. Increased physical activity. Drinking alcohol on an empty stomach. If you do not have diabetes, hypoglycemia may be caused by: A tumor in the pancreas. Not eating enough, or not eating for long periods at a time (fasting). A severe infection or illness. Problems after having bariatric surgery. Organ failure, such as kidney or liver failure. Certain medicines. What increases the risk? Hypoglycemia is more  likely to develop in people who: Have diabetes and take medicines to lower blood glucose. Abuse alcohol. Have a severe illness. What are the signs or symptoms? Symptoms vary depending on whether the condition is mild, moderate, or severe. Mild hypoglycemia Hunger. Sweating and feeling clammy. Dizziness or feeling light-headed. Sleepiness or restless sleep. Nausea. Increased heart rate. Headache. Blurry vision. Mood changes, such as irritability or anxiety. Tingling or numbness around the mouth, lips, or tongue. Moderate hypoglycemia Confusion and poor judgment. Behavior changes. Weakness. Irregular heartbeat. A change in coordination. Severe hypoglycemia Severe hypoglycemia is a medical emergency. It can cause: Fainting. Seizures. Loss of consciousness (coma). Death. How is this diagnosed? Hypoglycemia is diagnosed with a blood test to measure your blood glucose level. This blood test is done while you are having symptoms. Your health care provider may also do a physical exam and review your medical history. How is this treated? This condition can be treated by immediately eating or drinking something that contains sugar with 15 grams of fast-acting carbohydrate, such as: 4 oz (120 mL) of fruit juice. 4 oz (120 mL) of regular soda (not diet soda). Several pieces of hard candy. Check food labels to find out how many pieces to eat for 15 grams. 1 Tbsp (15 mL) of sugar or honey. 4 glucose tablets. 1 tube of glucose gel. Treating hypoglycemia if you have diabetes If you are alert and able to swallow safely, follow the 15:15 rule: Take 15 grams of a fast-acting carbohydrate. Talk with your health care provider about how much you should take. Options for getting 15 grams of fast-acting carbohydrate include: Glucose tablets (take 4 tablets).  Several pieces of hard candy. Check food labels to find out how many pieces to eat for 15 grams. 4 oz (120 mL) of fruit juice. 4 oz (120  mL) of regular soda (not diet soda). 1 Tbsp (15 mL) of sugar or honey. 1 tube of glucose gel. Check your blood glucose 15 minutes after you take the carbohydrate. If the repeat blood glucose level is still at or below 70 mg/dL (3.9 mmol/L), take 15 grams of a carbohydrate again. If your blood glucose level does not increase above 70 mg/dL (3.9 mmol/L) after 3 tries, seek emergency medical care. After your blood glucose level returns to normal, eat a meal or a snack within 1 hour.  Treating severe hypoglycemia Severe hypoglycemia is when your blood glucose level is below 54 mg/dL (3 mmol/L). Severe hypoglycemia is a medical emergency. Get medical help right away. If you have severe hypoglycemia and you cannot eat or drink, you will need to be given glucagon. A family member or close friend should learn how to check your blood glucose and how to give you glucagon. Ask your health care provider if you need to have an emergency glucagon kit available. Severe hypoglycemia may need to be treated in a hospital. The treatment may include getting glucose through an IV. You may also need treatment for the cause of your hypoglycemia. Follow these instructions at home: General instructions Take over-the-counter and prescription medicines only as told by your health care provider. Monitor your blood glucose as told by your health care provider. If you drink alcohol: Limit how much you have to: 0-1 drink a day for women who are not pregnant. 0-2 drinks a day for men. Know how much alcohol is in your drink. In the [place] one drink equals one 12 oz bottle of beer (355 mL), one 5 oz glass of wine (148 mL), or one 1 oz glass of hard liquor (44 mL). Be sure to eat food along with drinking alcohol. Be aware that alcohol is absorbed quickly and may have lingering effects that may result in hypoglycemia later. Be sure to do ongoing glucose monitoring. Keep all follow-up visits. This is important. If you have  diabetes: Always have a fast-acting carbohydrate (15 grams) option with you to treat low blood glucose. Follow your diabetes management plan as directed by your health care provider. Make sure you: Know the symptoms of hypoglycemia. It is important to treat it right away to prevent it from becoming severe. Check your blood glucose as often as told. Always check before and after exercise. Always check your blood glucose before you drive a motorized vehicle. Take your medicines as told. Follow your meal plan. Eat on time, and do not skip meals. Share your diabetes management plan with people in your workplace, school, and household. Carry a medical alert card or wear medical alert jewelry. Where to find more information American Diabetes Association: www.diabetes.org Contact a health care provider if: You have problems keeping your blood glucose in your target range. You have frequent episodes of hypoglycemia. Get help right away if: You continue to have hypoglycemia symptoms after eating or drinking something that contains 15 grams of fast-acting carbohydrate, and you cannot get your blood glucose above 70 mg/dL (3.9 mmol/L) while following the 15:15 rule. Your blood glucose is below 54 mg/dL (3 mmol/L). You have a seizure. You faint. These symptoms may represent a serious problem that is an emergency. Do not wait to see if the symptoms will go away. Get medical  help right away. Call your local emergency services (911 in the [place] Do not drive yourself to the hospital. Summary Hypoglycemia occurs when the level of sugar (glucose) in the blood is too low. Hypoglycemia can happen in people who have or do not have diabetes. It can develop quickly, and it can be a medical emergency. Make sure you know the symptoms of hypoglycemia and how to treat it. Always have a fast-acting carbohydrate option with you to treat low blood sugar. This information is not intended to replace advice given to you  by your health care provider. Make sure you discuss any questions you have with your health care provider. Document Revised: 10/23/2020 Document Reviewed: 10/23/2020 Elsevier Patient Education  2022 [employer].

## 2021-10-15 ENCOUNTER — Telehealth: Payer: Self-pay | Admitting: *Deleted

## 2021-10-15 ENCOUNTER — Other Ambulatory Visit: Payer: Self-pay

## 2021-10-15 ENCOUNTER — Telehealth: Payer: Self-pay | Admitting: Pharmacist

## 2021-10-15 DIAGNOSIS — E66812 Obesity, class 2: Secondary | ICD-10-CM | POA: Insufficient documentation

## 2021-10-15 LAB — BASIC METABOLIC PANEL
BUN/Creatinine Ratio: 19 (ref 9–23)
BUN: 18 mg/dL (ref 6–24)
CO2: 22 mmol/L (ref 20–29)
Calcium: 9 mg/dL (ref 8.7–10.2)
Chloride: 106 mmol/L (ref 96–106)
Creatinine, Ser: 0.93 mg/dL (ref 0.57–1.00)
Glucose: 162 mg/dL — ABNORMAL HIGH (ref 70–99)
Potassium: 4.6 mmol/L (ref 3.5–5.2)
Sodium: 140 mmol/L (ref 134–144)
eGFR: 76 mL/min/{1.73_m2} (ref >59)

## 2021-10-15 LAB — BRAIN NATRIURETIC PEPTIDE: BNP: 285.1 pg/mL — ABNORMAL HIGH (ref 0.0–100.0)

## 2021-10-15 MED ORDER — LANTUS SOLOSTAR 100 UNIT/ML ~~LOC~~ SOPN
35.0000 [IU] | PEN_INJECTOR | Freq: Every day | SUBCUTANEOUS | 11 refills | Status: DC
  Filled 2021-10-15: qty 15, 42d supply, fill #0

## 2021-10-15 NOTE — Telephone Encounter (Signed)
Noted and thank you

## 2021-10-15 NOTE — Therapy (Signed)
[facility] [address] [place], [place], [address] Phone: [phone]   Fax:  [phone]  Physical Therapy Treatment  Patient Details  Name: Sarah Long MRN: [id number] Date of Birth: Jul 02, 1971 Referring Provider (PT): Dr. Sharol Given   Encounter Date: 10/14/2021   PT End of Session - 10/14/21 0946     Visit Number 2    Number of Visits 17    Date for PT Re-Evaluation 12/02/21    Authorization Type CCME    Authorization Time Period Auth submitted 11/9    Authorization - Number of Visits 4    Progress Note Due on Visit 4    PT Start Time [phone]   pt running late for appt   PT Stop Time 1015    PT Time Calculation (min) 28 min    Equipment Utilized During Treatment --    Activity Tolerance Patient tolerated treatment well    Behavior During Therapy [facility] for tasks assessed/performed             Past Medical History:  Diagnosis Date   Abnormal Pap smear 1998   Abnormal vaginal bleeding 12/19/2011   Acute urinary retention 06/26/2021   Arthritis    Bacterial infection    Bipolar 1 disorder ([place])    Blister of second toe of left foot 11/21/2016   Candida vaginitis 07/2007   Depression    recently added wellbutrin-has not taken yet for bipolar   Diabetes in pregnancy    Diabetes mellitus    nph 20U qam and qpm, regular with meals   Diabetic ketoacidosis without coma associated with type 2 diabetes mellitus ([place])    Fibroid    Galactorrhea of right breast 2008   GERD (gastroesophageal reflux disease)    H/O amenorrhea 07/2007   H/O dizziness 10/14/2011   H/O dysmenorrhea 2010   H/O menorrhagia 10/14/2011   H/O varicella    Headache(784.0)    Heavy vaginal bleeding due to contraceptive injection use 10/12/2011   Depo Provera   Herpes    HSV-2 infection 01/03/2009   Hx: UTI (urinary tract infection) 2009   Hypertension    on aldomet   Increased BMI 2010   Irregular uterine bleeding 04/04/2012   Pt has  mirena    Obesity 10/14/2011   Oligomenorrhea 07/2007   Pelvic pain in female    Presence of permanent cardiac pacemaker    Preterm labor    Trichomonas    Yeast infection     Past Surgical History:  Procedure Laterality Date   ABDOMINAL AORTOGRAM W/LOWER EXTREMITY N/A 06/17/2021   Procedure: ABDOMINAL AORTOGRAM W/LOWER EXTREMITY;  Surgeon: Wellington Hampshire, MD;  Location: [facility];  Service: Cardiovascular;  Laterality: N/A;   AMPUTATION Left 06/27/2021   Procedure: AMPUTATION BELOW KNEE;  Surgeon: Newt Minion, MD;  Location: [facility];  Service: Orthopedics;  Laterality: Left;   AMPUTATION Right 09/04/2021   Procedure: RIGHT GREAT TOE AMPUTATION;  Surgeon: Newt Minion, MD;  Location: [facility];  Service: Orthopedics;  Laterality: Right;   CESAREAN SECTION  1991   LEFT HEART CATH AND CORONARY ANGIOGRAPHY N/A 02/10/2020   Procedure: LEFT HEART CATH AND CORONARY ANGIOGRAPHY;  Surgeon: Troy Sine, MD;  Location: [facility];  Service: Cardiovascular;  Laterality: N/A;   PACEMAKER IMPLANT N/A 02/13/2020   Procedure: PACEMAKER IMPLANT;  Surgeon: Constance Haw, MD;  Location: [facility];  Service: Cardiovascular;  Laterality: N/A;   TEMPORARY  PACEMAKER N/A 02/10/2020   Procedure: TEMPORARY PACEMAKER;  Surgeon: Troy Sine, MD;  Location: [name];  Service: Cardiovascular;  Laterality: N/A;    There were no vitals filed for this visit.   Subjective Assessment - 10/14/21 0945     Subjective Saw Gerald Stabs after last PT session (eval). He made adjustments and added liner back to socket. No falls.    Pertinent History L BKA, R big toe amputation    Limitations Standing;Walking;House hold activities                     [facility] Adult PT Treatment/Exercise - 10/14/21 0956       Transfers   Transfers Sit to Stand;Stand to Sit    Sit to Stand 5: Supervision;With upper extremity assist;From chair/3-in-1    Stand to Sit 5: Supervision;With  upper extremity assist;To chair/3-in-1      Ambulation/Gait   Ambulation/Gait Yes    Ambulation/Gait Assistance 5: Supervision    Ambulation/Gait Assistance Details pt noted to have decreased weight shifting onto prosthesis in stance which increased as pt fatigues/pain increases.    Ambulation Distance (Feet) --   around clinic with session   Assistive device None;Prosthesis    Gait Pattern Step-through pattern;Decreased stride length;Decreased stance time - left;Decreased step length - right;Decreased weight shift to left;Left steppage;Lateral hip instability;Narrow base of support;Decreased trunk rotation;Antalgic    Ambulation Surface Level;Indoor      Prosthetics   Prosthetic Care Comments  pt reporting continued frustration and issues with donning liner/socket. Reinforced use of a mirror to see distance end of limb for both skin care checks and to assist with donning liner to better align pin. with practice in session pt able to get liner in correct place on 2cd try using mirror feedback. Pt then educated on use of socks. Pt not in need of any at this time as socket fitting very tight. Discussed how fluid in limb can fluctuate based on activity, diet and other factors. Edcuated on signs that socket is not fitting well and the need for socks at that time. Recommended pt on increase sock ply one at a time till best fit in acheived. Also discussed pt's high level of pain in limb with activity/gait and the idea of "too much, too soon" as pt weaned of walker to no device in less than 4 weeks of having the prosthesis. Discussed how too much pressure too soon on limb with prosthesis can cause increased pain and skin breakdown. Advised pt to use cane at least for now to off load prosthesis for decreased pain and to allow for increased accomodation to prosthesis. This will also allow PT to work on gait mechanics more so not to create bad walking patterns. Pt verbalized understanding.    Current prosthetic  wear tolerance (days/week)  daily    Current prosthetic wear tolerance (#hours/day)  most awake hours with breaks during day to dry skin or due to increased pain.    Residual limb condition  small wound at distal end of limb along incision. lenghth 1 cm, width 0.5 cm and depth- unable to measure due to lack of sterile cotton tips at this time. Covered with Tegaderm. Pt educated why use of Tegaderm vs other coverings under liner/socket. Pt also educated on how to don Tegaderm, how long to wear it for and to leave wound open to air when not in liner/socket.    Education Provided Residual limb care;Correct ply sock adjustment;Proper Donning;Proper wear  schedule/adjustment;Proper weight-bearing schedule/adjustment;Other (comment)   see care comments above   Person(s) Educated Patient    Education Method Explanation;Demonstration;Verbal cues    Education Method Verbalized understanding;Returned demonstration;Verbal cues required;Needs further instruction    Donning Prosthesis Supervision                    PT Short Term Goals - 10/08/21 1539       PT SHORT TERM GOAL #1   Title Patient will verbalize proper instructions for sweat management, sock management, residual limb and prosthetic leg care to improve functional independence with prosthetic leg.    Baseline Education initiated (10/07/21)    Time 4    Period Weeks    Status New    Target Date 11/05/21      PT SHORT TERM GOAL #2   Title Patient will be able to go up and down curb step without AD and SBA to improve community access    Baseline not attempted    Time 4    Period Weeks    Status New    Target Date 11/05/21               PT Long Term Goals - 10/08/21 1551       PT LONG TERM GOAL #1   Title Patient will demo 20/30 on FGA to improve functional gait and ambulation    Baseline 10/30 (10/07/21)    Time 8    Period Weeks    Status New    Target Date 12/02/21      PT LONG TERM GOAL #2   Title Pt will demo at  least 0.62m/s gait speed without AD to improve commnity ambulation    Baseline 0.5 m/s (10/07/21)    Time 8    Period Weeks    Status New    Target Date 12/02/21                   Plan - 10/14/21 1630     Clinical Impression Statement Today's skilled session continued to focus on prosthetic management and education. Pt with less pain with improved fitting after re-donning in session. The pt is making progress and should benefit from continued PT to progress toward unmet goals.    Personal Factors and Comorbidities Past/Current Experience;Comorbidity 2    Comorbidities L BKA, R big toe amputation    Examination-Activity Limitations Lift;Squat;Stairs;Stand    Examination-Participation Restrictions Cleaning;Community Activity;Occupation;Shop;Yard Work    Stability/Clinical Decision Making Stable/Uncomplicated    Rehab Potential Good    PT Frequency 2x / week    PT Duration 8 weeks    PT Treatment/Interventions ADLs/Self Care Home Management;Cryotherapy;Moist Heat;Biofeedback;Gait training;Stair training;Functional mobility training;Therapeutic activities;Therapeutic exercise;Balance training;Manual techniques;Prosthetic Training;Orthotic Fit/Training;Patient/family education;Neuromuscular re-education;Passive range of motion;Scar mobilization;Energy conservation;Joint Manipulations    PT Next Visit Plan Continue with prosthetic education, sock management, sweat management. 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    Consulted and Agree with Plan of Care Patient             Patient will benefit from skilled therapeutic intervention in order to improve the following deficits and impairments:  Abnormal gait, Decreased activity tolerance, Decreased balance, Decreased coordination, Decreased range of motion, Decreased mobility,  Decreased endurance, Decreased skin integrity, Decreased strength, Difficulty walking, Impaired flexibility, Increased edema, Impaired sensation, Postural dysfunction, Pain, Prosthetic  Dependency  Visit Diagnosis: Other abnormalities of gait and mobility  Muscle weakness (generalized)     Problem List Patient Active Problem List   Diagnosis Date Noted   Class 2 severe obesity due to excess calories with serious comorbidity and body mass index (BMI) of 36.0 to 36.9 in adult [facility]) 10/15/2021   Hypoglycemia 10/14/2021   Elevated blood pressure reading with diagnosis of hypertension 10/07/2021   Insomnia 10/07/2021   Localized edema 10/07/2021   Right toe amputee ([place])    Left below-knee amputee ([place]) 07/01/2021   Protein-calorie malnutrition ([place]) 06/26/2021   Hypoalbuminemia due to protein-calorie malnutrition ([place]) 06/26/2021   Normocytic anemia 06/26/2021   Heart failure with mid-range ejection fraction ([place]) 06/26/2021   Diabetic nephropathy with proteinuria ([place]) 06/26/2021   Diabetic foot ulcer ([place]) 05/26/2021   Bacterial vaginosis 07/17/2020   Dental caries 04/01/2020   Cracked tooth 04/01/2020   Alcohol use 03/11/2020   Vitamin D deficiency 03/11/2020   Prolonged QT interval 02/26/2020   Chronic combined systolic and diastolic CHF (congestive heart failure) ([place]) 02/26/2020   Heart block AV complete ([place]) 02/11/2020   Exocrine pancreatic insufficiency    Dysmenorrhea 10/02/2019   DM type 2 with diabetic peripheral neuropathy ([place]) 05/30/2019   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 block) 05/29/2019   Bilateral bunions 05/29/2019   Type 2 diabetes mellitus ([place]) 10/21/2016   Smoking 11/26/2013   Hypercholesteremia 02/02/2007   Bipolar 1 disorder ([place]) 02/02/2007   HYPERTENSION, BENIGN SYSTEMIC 02/02/2007    Willow Ora, PTA, [facility] [address], [place], [place] [address] [phone] 10/15/21, 11:07 AM    Name: Sarah Long MRN: [id number] Date of Birth: 1971-09-01

## 2021-10-15 NOTE — Telephone Encounter (Signed)
Patient verified DOB Patient is aware of labs improving and heart maker still being elevated. Encouraged to continue with treatment plan and follow up.

## 2021-10-15 NOTE — Telephone Encounter (Signed)
Sarah Long can you see this patient today at all? Or at least give her a call, I am pretty swamped today  When she saw Cari last week her short acting insulin got changed from novolog to humalog and perhaps she got confused on use of the new pen and over dosed her short acting  Face to face would be best , her phone doesn't do video well

## 2021-10-15 NOTE — Telephone Encounter (Signed)
Call placed to patient. Verified that I was speaking to the patient using two identifiers.   Was unable to access the nurse triage note sent to me for Epic/security reasons.   Briefly, pt endorses almost daily hypoglycemic episodes. Reports objective glucose level as low as 54 Tuesday morning of this week. She reports compliance with daily Lantus injections. She tells me this was decreased to 40 units daily at an appt with Cari yesterday (11/09). She also voices understanding of when to hold Humalog. She does not take her dose if she doesn't eat. When she does eat, she takes 10 units with meals.   She tells me her hypoglycemia occurs mostly in the morning, fasting. She does take her Lantus even if her morning CBG is low. This morning her glucose was 87. She has taken 40u of Lantus already today. Denies any S/sx of hypoglycemia currently.   I advised her to decrease Lantus further to 35 units starting tomorrow morning. She was instructed to continue holding Humalog if she doesn't eat. In the future, we can consider d/c'ing or decreasing the Humalog dose if hypoglycemia persists. Rule of 15 covered for tx of hypoglycemia.   Total time counseling over the phone: 15 minutes  Benard Halsted, PharmD, [place], [place] [phone]

## 2021-10-15 NOTE — Telephone Encounter (Signed)
-----   Message from Kennieth Rad, [place] sent at 10/15/2021  3:17 PM EST ----- Please call patient and let her know that her kidney function has improved, her marker for heart failure is still elevated, she needs to continue current treatment plan.

## 2021-10-16 ENCOUNTER — Ambulatory Visit: Payer: Medicaid Other

## 2021-10-16 LAB — CUP PACEART REMOTE DEVICE CHECK
Battery Remaining Longevity: 96 mo
Battery Remaining Percentage: 85 %
Battery Voltage: 2.99 V
Brady Statistic AP VP Percent: 1 %
Brady Statistic AP VS Percent: 1 %
Brady Statistic AS VP Percent: 98 %
Brady Statistic AS VS Percent: 1 %
Brady Statistic RA Percent Paced: 1 %
Brady Statistic RV Percent Paced: 98 %
Date Time Interrogation Session: 20221108015025
Implantable Lead Implant Date: 20210310
Implantable Lead Implant Date: 20210310
Implantable Lead Location: 753859
Implantable Lead Location: 753860
Implantable Pulse Generator Implant Date: 20210310
Lead Channel Impedance Value: 430 Ohm
Lead Channel Impedance Value: 460 Ohm
Lead Channel Pacing Threshold Amplitude: 0.875 V
Lead Channel Pacing Threshold Amplitude: 0.875 V
Lead Channel Pacing Threshold Pulse Width: 0.5 ms
Lead Channel Pacing Threshold Pulse Width: 0.5 ms
Lead Channel Sensing Intrinsic Amplitude: 12 mV
Lead Channel Sensing Intrinsic Amplitude: 4 mV
Lead Channel Setting Pacing Amplitude: 1.125
Lead Channel Setting Pacing Amplitude: 1.875
Lead Channel Setting Pacing Pulse Width: 0.5 ms
Lead Channel Setting Sensing Sensitivity: 2 mV
Pulse Gen Model: 2272
Pulse Gen Serial Number: 3802095

## 2021-10-18 NOTE — Progress Notes (Signed)
Cardiology Office Note Date:  10/18/2021  Patient ID:  Sarah, Long October 18, 1971, MRN [id number] PCP:  [name], MD  Cardiologist/Vascular: Dr. Fletcher Anon Electrophysiologist: Dr. Curt Bears     Chief Complaint: over due  History of Present Illness: Sarah Long is a 50 y.o. female with history of DM, HTN, s/p L BKA July 2022 2/2 non-healing wound toes w/OM, HLD, smoker, CHB w/PPM  She had a cath  March 2021 showed normal coronary arteries.  Struggled with non-healing wounds L toes, angiography was performed in July of this year which showed no significant peripheral arterial disease.  She had normal flow to the toes, though required left BKA 2/2 OM Has also had subsequent right great toe amputated  She come sin today to be seen for Dr. Curt Bears, last seen by him June 2021, at that time, no cardiac concerns, device functioning well, no changes were made.  Most recently she saw Dr. Fletcher Anon, she had no evidence of PVD by angiography and wound issues likely 2/2 poorly controlled DM, planned to see him PRN  TODAY She has her prosthetic now and over the last couple months much more active physically then she had been for the months prior. She has noticed that when active she is winded, suspects 2/2 deconditioning though in the last 3 weeks or so also noted that her RLE and LLE stump/gets swollen She was started on lasix about 2 weeks ago and has had increase in urine OP, but not noted too much improvement in the edema No rest or positional SOB No near syncope or syncope No CP  Device information SJM dual chamber PPM implanted 02/13/2020   Past Medical History:  Diagnosis Date   Abnormal Pap smear 1998   Abnormal vaginal bleeding 12/19/2011   Acute urinary retention 06/26/2021   Arthritis    Bacterial infection    Bipolar 1 disorder ([place])    Blister of second toe of left foot 11/21/2016   Candida vaginitis 07/2007   Depression    recently added wellbutrin-has not taken yet  for bipolar   Diabetes in pregnancy    Diabetes mellitus    nph 20U qam and qpm, regular with meals   Diabetic ketoacidosis without coma associated with type 2 diabetes mellitus ([place])    Fibroid    Galactorrhea of right breast 2008   GERD (gastroesophageal reflux disease)    H/O amenorrhea 07/2007   H/O dizziness 10/14/2011   H/O dysmenorrhea 2010   H/O menorrhagia 10/14/2011   H/O varicella    Headache(784.0)    Heavy vaginal bleeding due to contraceptive injection use 10/12/2011   Depo Provera   Herpes    HSV-2 infection 01/03/2009   Hx: UTI (urinary tract infection) 2009   Hypertension    on aldomet   Increased BMI 2010   Irregular uterine bleeding 04/04/2012   Pt has mirena    Obesity 10/14/2011   Oligomenorrhea 07/2007   Pelvic pain in female    Presence of permanent cardiac pacemaker    Preterm labor    Trichomonas    Yeast infection     Past Surgical History:  Procedure Laterality Date   ABDOMINAL AORTOGRAM W/LOWER EXTREMITY N/A 06/17/2021   Procedure: ABDOMINAL AORTOGRAM W/LOWER EXTREMITY;  Surgeon: Wellington Hampshire, MD;  Location: [facility];  Service: Cardiovascular;  Laterality: N/A;   AMPUTATION Left 06/27/2021   Procedure: AMPUTATION BELOW KNEE;  Surgeon: Newt Minion, MD;  Location: [facility];  Service: Orthopedics;  Laterality: Left;   AMPUTATION Right 09/04/2021   Procedure: RIGHT GREAT TOE AMPUTATION;  Surgeon: Newt Minion, MD;  Location: [facility];  Service: Orthopedics;  Laterality: Right;   CESAREAN SECTION  1991   LEFT HEART CATH AND CORONARY ANGIOGRAPHY N/A 02/10/2020   Procedure: LEFT HEART CATH AND CORONARY ANGIOGRAPHY;  Surgeon: Troy Sine, MD;  Location: [facility];  Service: Cardiovascular;  Laterality: N/A;   PACEMAKER IMPLANT N/A 02/13/2020   Procedure: PACEMAKER IMPLANT;  Surgeon: Constance Haw, MD;  Location: [facility];  Service: Cardiovascular;  Laterality: N/A;   TEMPORARY PACEMAKER N/A 02/10/2020   Procedure:  TEMPORARY PACEMAKER;  Surgeon: Troy Sine, MD;  Location: [facility];  Service: Cardiovascular;  Laterality: N/A;    Current Outpatient Medications  Medication Sig Dispense Refill   Accu-Chek Softclix Lancets lancets Use as directed up to 4 times daily 100 each 5   Blood Glucose Monitoring Suppl (ACCU-CHEK GUIDE) w/Device KIT Use as directed 1 kit 0   carvedilol (COREG) 6.25 MG tablet Take 1 tablet (6.25 mg total) by mouth 2 (two) times daily with a meal. 60 tablet 3   furosemide (LASIX) 20 MG tablet Take 1 tablet (20 mg total) by mouth daily. 30 tablet 3   glucose 4 GM chewable tablet Chew 1 tablet (4 g total) by mouth as needed for low blood sugar. 50 tablet 12   glucose blood (ACCU-CHEK GUIDE) test strip Use as instructed up to 4 times daily 100 each 12   HYDROcodone-acetaminophen (NORCO/VICODIN) 5-325 MG tablet Take 1 tablet by mouth every 4 (four) hours as needed for moderate pain. (Patient not taking: Reported on 09/21/2021) 30 tablet 0   insulin glargine (LANTUS SOLOSTAR) 100 UNIT/ML Solostar Pen Inject 35 Units into the skin daily. 15 mL 11   insulin lispro (HUMALOG) 100 UNIT/ML KwikPen Inject 10 Units into the skin 3 (three) times daily before meals. 6 mL 3   Insulin Pen Needle (TECHLITE PEN NEEDLES) 32G X 4 MM MISC Use as directed with insulin pens 100 each 3   melatonin 3 MG TABS tablet Take 1 tablet (3 mg total) by mouth at bedtime as needed. 30 tablet 0   Multiple Vitamins-Minerals (ADULT ONE DAILY GUMMIES PO) Take 1 capsule by mouth daily.     Pancrelipase, Lip-Prot-Amyl, (ZENPEP) 20000-63000 units CPEP Take 1 capsule by mouth 3 (three) times daily before meals. 180 capsule 4   rosuvastatin (CRESTOR) 10 MG tablet Take 1 tablet (10 mg total) by mouth daily. 90 tablet 1   No current facility-administered medications for this visit.    Allergies:   Strawberry extract and Sulfa antibiotics   Social History:  The patient  reports that she quit smoking about 3 months  ago. Her smoking use included cigarettes. She has a 10.00 pack-year smoking history. She has never used smokeless tobacco. She reports that she does not drink alcohol and does not use drugs.   Family History:  The patient's family history includes Diabetes in her father and mother; Heart disease in her father and mother; Hypertension in her father and mother; Stroke in her maternal grandfather.  ROS:  Please see the history of present illness.    All other systems are reviewed and otherwise negative.   PHYSICAL EXAM:  VS:  There were no vitals taken for this visit. BMI: There is no height or weight on file to calculate BMI. Well nourished, well developed, in no acute distress HEENT: normocephalic, atraumatic  Neck: no JVD, carotid bruits or masses Cardiac:  RRR; no significant murmurs, no rubs, or gallops Lungs:  CTA b/l, no wheezing, rhonchi or rales Abd: soft, nontender MS: L BKA w/prosthetic Ext: trace-1+ edema RLE to midshin Skin: warm and dry, no rash Neuro:  No gross deficits appreciated Psych: euthymic mood, full affect  PPM site is stable, no tethering or discomfort   EKG:  not done today  Device interrogation done today and reviewed by myself:  Battery and lead measurements are good + AMS, most or only seconds of AFib, a couple brief AT , longest 12 seconds Burden <1%   02/10/2020: LHC (temp pacing wire plcmt) Diabetic ketoacidosis with complete heart block with ventricular escape rhythm. Successful insertion of a temporary venous pacemaker advanced to the RV apex with excellent capture.  Currently set at 80 bpm, and a 5. Normal coronary arteries. LVEDP 32 mmHg    02/10/2020: TTE IMPRESSIONS   1. Technically difficult; mild to moderate global reduction in LV  systolic function; severe LVH (myocardium with speckeld appearance;  suggest further evaluation for amyloid); mild MR.   2. Left ventricular ejection fraction, by estimation, is 40 to 45%. The  left ventricle  has mildly decreased function. The left ventricle  demonstrates global hypokinesis. There is severe left ventricular  hypertrophy. Left ventricular diastolic parameters   are indeterminate.   3. Right ventricular systolic function is normal. The right ventricular  size is normal. There is normal pulmonary artery systolic pressure.   4. The mitral valve is normal in structure and function. Mild mitral  valve regurgitation. No evidence of mitral stenosis.   5. The aortic valve is tricuspid. Aortic valve regurgitation is not  visualized. No aortic stenosis is present.   6. The inferior vena cava is dilated in size with <50% respiratory  variability, suggesting right atrial pressure of 15 mmHg.   Recent Labs: 06/28/2021: Magnesium 1.7 09/04/2021: Hemoglobin 9.9; Platelets 224 10/07/2021: ALT 6 10/14/2021: BNP 285.1; BUN 18; Creatinine, Ser 0.93; Potassium 4.6; Sodium 140  06/18/2021: Chol/HDL Ratio 2.6; Cholesterol, Total 98; HDL 37; LDL Chol Calc (NIH) 40; Triglycerides 117   Estimated Creatinine Clearance: 83.9 mL/min (by C-G formula based on SCr of 0.93 mg/dL).   Wt Readings from Last 3 Encounters:  10/14/21 211 lb (95.7 kg)  10/07/21 210 lb (95.3 kg)  09/08/21 180 lb (81.6 kg)     Other studies reviewed: Additional studies/records reviewed today include: summarized above  ASSESSMENT AND PLAN:  PPM Intact function No programming changes made  HTN Follow with diuresis, she had room for an ARB if needed, pending her echo  HLD Not addressed today   4. Edema 5. DOE Will update her echo 98%VP Device dependent today  Increase her lasix to 3m BID x5 days then daily again BMET today on lasic a couple weeks   Disposition: F/u with uKoreain 227mopending echo, sooner if needed  Current medicines are reviewed at length with the patient today.  The patient did not have any concerns regarding medicines.  SiVenetia NightPA-C 10/18/2021 3:46 PM     [place]  [phone])  [phone])

## 2021-10-22 ENCOUNTER — Other Ambulatory Visit: Payer: Self-pay

## 2021-10-22 ENCOUNTER — Ambulatory Visit: Payer: Medicaid Other | Admitting: Physician Assistant

## 2021-10-22 ENCOUNTER — Encounter: Payer: Self-pay | Admitting: Physician Assistant

## 2021-10-22 ENCOUNTER — Encounter: Payer: Medicaid Other | Admitting: Orthopedic Surgery

## 2021-10-22 VITALS — BP 150/90 | HR 93 | Ht 64.0 in | Wt 216.6 lb

## 2021-10-22 DIAGNOSIS — Z950 Presence of cardiac pacemaker: Secondary | ICD-10-CM

## 2021-10-22 DIAGNOSIS — I5020 Unspecified systolic (congestive) heart failure: Secondary | ICD-10-CM

## 2021-10-22 DIAGNOSIS — I10 Essential (primary) hypertension: Secondary | ICD-10-CM

## 2021-10-22 DIAGNOSIS — R609 Edema, unspecified: Secondary | ICD-10-CM | POA: Diagnosis not present

## 2021-10-22 DIAGNOSIS — I442 Atrioventricular block, complete: Secondary | ICD-10-CM | POA: Diagnosis not present

## 2021-10-22 DIAGNOSIS — R0602 Shortness of breath: Secondary | ICD-10-CM

## 2021-10-22 DIAGNOSIS — I5022 Chronic systolic (congestive) heart failure: Secondary | ICD-10-CM

## 2021-10-22 LAB — BASIC METABOLIC PANEL
BUN/Creatinine Ratio: 21 (ref 9–23)
BUN: 19 mg/dL (ref 6–24)
CO2: 19 mmol/L — ABNORMAL LOW (ref 20–29)
Calcium: 8.7 mg/dL (ref 8.7–10.2)
Chloride: 105 mmol/L (ref 96–106)
Creatinine, Ser: 0.9 mg/dL (ref 0.57–1.00)
Glucose: 129 mg/dL — ABNORMAL HIGH (ref 70–99)
Potassium: 4.8 mmol/L (ref 3.5–5.2)
Sodium: 138 mmol/L (ref 134–144)
eGFR: 79 mL/min/{1.73_m2} (ref >59)

## 2021-10-22 LAB — CUP PACEART INCLINIC DEVICE CHECK
Date Time Interrogation Session: 20221117231218
Implantable Lead Implant Date: 20210310
Implantable Lead Implant Date: 20210310
Implantable Lead Location: 753859
Implantable Lead Location: 753860
Implantable Pulse Generator Implant Date: 20210310
Lead Channel Pacing Threshold Amplitude: 0.75 V
Lead Channel Pacing Threshold Amplitude: 0.75 V
Lead Channel Pacing Threshold Pulse Width: 0.5 ms
Lead Channel Pacing Threshold Pulse Width: 0.5 ms
Lead Channel Sensing Intrinsic Amplitude: 4.2 mV
Pulse Gen Model: 2272
Pulse Gen Serial Number: 3802095

## 2021-10-22 NOTE — Patient Instructions (Addendum)
Medication Instructions:    FOR FIVE DAYS ONLY:   TAKE FUROSEMIDE (LASIX) 20 MG TWICE A DAY  THEN RESUME BACK TO  FUROSEMIDE (LASIX) 20 MG ONCE A DAY   *If you need a refill on your cardiac medications before your next appointment, please call your pharmacy*   Lab Work: BMET TODAY    If you have labs (blood work) drawn today and your tests are completely normal, you will receive your results only by: [place] (if you have MyChart) OR A paper copy in the mail If you have any lab test that is abnormal or we need to change your treatment, we will call you to review the results.   Testing/Procedures: Your physician has requested that you have an echocardiogram. Echocardiography is a painless test that uses sound waves to create images of your heart. It provides your doctor with information about the size and shape of your heart and how well your heart's chambers and valves are working. This procedure takes approximately one hour. There are no restrictions for this procedure.     Follow-Up: At [facility], you and your health needs are our priority.  As part of our continuing mission to provide you with exceptional heart care, we have created designated Provider Care Teams.  These Care Teams include your primary Cardiologist (physician) and Advanced Practice Providers (APPs -  Physician Assistants and Nurse Practitioners) who all work together to provide you with the care you need, when you need it.  We recommend signing up for the patient portal called "MyChart".  Sign up information is provided on this After Visit Summary.  MyChart is used to connect with patients for Virtual Visits (Telemedicine).  Patients are able to view lab/test results, encounter notes, upcoming appointments, etc.  Non-urgent messages can be sent to your provider as well.   To learn more about what you can do with MyChart, go to [email or web address].    Your next appointment:   2 month(s)  The  format for your next appointment:   In Person  Provider:   You may see Will Meredith [place], MD or one of the following Advanced Practice Providers on your designated Care Team:   Tommye Standard, [place] Legrand Como "Jonni Sanger" Chalmers Cater, PA-C{   Other Instructions .Low-Sodium Eating Plan Sodium, which is an element that makes up salt, helps you maintain a healthy balance of fluids in your body. Too much sodium can increase your blood pressure and cause fluid and waste to be held in your body. Your health care provider or dietitian may recommend following this plan if you have high blood pressure (hypertension), kidney disease, liver disease, or heart failure. Eating less sodium can help lower your blood pressure, reduce swelling, and protect your heart, liver, and kidneys. What are tips for following this plan? Reading food labels The Nutrition Facts label lists the amount of sodium in one serving of the food. If you eat more than one serving, you must multiply the listed amount of sodium by the number of servings. Choose foods with less than 140 mg of sodium per serving. Avoid foods with 300 mg of sodium or more per serving. Shopping  Look for lower-sodium products, often labeled as "low-sodium" or "no salt added." Always check the sodium content, even if foods are labeled as "unsalted" or "no salt added." Buy fresh foods. Avoid canned foods and pre-made or frozen meals. Avoid canned, cured, or processed meats. Buy breads that have less than 80 mg of sodium per slice. Cooking  Eat more home-cooked food and less restaurant, buffet, and fast food. Avoid adding salt when cooking. Use salt-free seasonings or herbs instead of table salt or sea salt. Check with your health care provider or pharmacist before using salt substitutes. Cook with plant-based oils, such as canola, sunflower, or olive oil. Meal planning When eating at a restaurant, ask that your food be prepared with less salt or no salt, if  possible. Avoid dishes labeled as brined, pickled, cured, smoked, or made with soy sauce, miso, or teriyaki sauce. Avoid foods that contain MSG (monosodium glutamate). MSG is sometimes added to [place] food, bouillon, and some canned foods. Make meals that can be grilled, baked, poached, roasted, or steamed. These are generally made with less sodium. General information Most people on this plan should limit their sodium intake to 1,500-2,000 mg (milligrams) of sodium each day. What foods should I eat? Fruits Fresh, frozen, or canned fruit. Fruit juice. Vegetables Fresh or frozen vegetables. "No salt added" canned vegetables. "No salt added" tomato sauce and paste. Low-sodium or reduced-sodium tomato and vegetable juice. Grains Low-sodium cereals, including oats, puffed wheat and rice, and shredded wheat. Low-sodium crackers. Unsalted rice. Unsalted pasta. Low-sodium bread. Whole-grain breads and whole-grain pasta. Meats and other proteins Fresh or frozen (no salt added) meat, poultry, seafood, and fish. Low-sodium canned tuna and salmon. Unsalted nuts. Dried peas, beans, and lentils without added salt. Unsalted canned beans. Eggs. Unsalted nut butters. Dairy Milk. Soy milk. Cheese that is naturally low in sodium, such as ricotta cheese, fresh mozzarella, or Swiss cheese. Low-sodium or reduced-sodium cheese. Cream cheese. Yogurt. Seasonings and condiments Fresh and dried herbs and spices. Salt-free seasonings. Low-sodium mustard and ketchup. Sodium-free salad dressing. Sodium-free light mayonnaise. Fresh or refrigerated horseradish. Lemon juice. Vinegar. Other foods Homemade, reduced-sodium, or low-sodium soups. Unsalted popcorn and pretzels. Low-salt or salt-free chips. The items listed above may not be a complete list of foods and beverages you can eat. Contact a dietitian for more information. What foods should I avoid? Vegetables Sauerkraut, pickled vegetables, and relishes. Olives. [place]  fries. Onion rings. Regular canned vegetables (not low-sodium or reduced-sodium). Regular canned tomato sauce and paste (not low-sodium or reduced-sodium). Regular tomato and vegetable juice (not low-sodium or reduced-sodium). Frozen vegetables in sauces. Grains Instant hot cereals. Bread stuffing, pancake, and biscuit mixes. Croutons. Seasoned rice or pasta mixes. Noodle soup cups. Boxed or frozen macaroni and cheese. Regular salted crackers. Self-rising flour. Meats and other proteins Meat or fish that is salted, canned, smoked, spiced, or pickled. Precooked or cured meat, such as sausages or meat loaves. Berniece Salines. Ham. Pepperoni. Hot dogs. Corned beef. Chipped beef. Salt pork. Jerky. Pickled herring. Anchovies and sardines. Regular canned tuna. Salted nuts. Dairy Processed cheese and cheese spreads. Hard cheeses. Cheese curds. Blue cheese. Feta cheese. String cheese. Regular cottage cheese. Buttermilk. Canned milk. Fats and oils Salted butter. Regular margarine. Ghee. Bacon fat. Seasonings and condiments Onion salt, garlic salt, seasoned salt, table salt, and sea salt. Canned and packaged gravies. Worcestershire sauce. Tartar sauce. Barbecue sauce. Teriyaki sauce. Soy sauce, including reduced-sodium. Steak sauce. Fish sauce. Oyster sauce. Cocktail sauce. Horseradish that you find on the shelf. Regular ketchup and mustard. Meat flavorings and tenderizers. Bouillon cubes. Hot sauce. Pre-made or packaged marinades. Pre-made or packaged taco seasonings. Relishes. Regular salad dressings. Salsa. Other foods Salted popcorn and pretzels. Corn chips and puffs. Potato and tortilla chips. Canned or dried soups. Pizza. Frozen entrees and pot pies. The items listed above may not be a complete list  of foods and beverages you should avoid. Contact a dietitian for more information. Summary Eating less sodium can help lower your blood pressure, reduce swelling, and protect your heart, liver, and kidneys. Most people  on this plan should limit their sodium intake to 1,500-2,000 mg (milligrams) of sodium each day. Canned, boxed, and frozen foods are high in sodium. Restaurant foods, fast foods, and pizza are also very high in sodium. You also get sodium by adding salt to food. Try to cook at home, eat more fresh fruits and vegetables, and eat less fast food and canned, processed, or prepared foods. This information is not intended to replace advice given to you by your health care provider. Make sure you discuss any questions you have with your health care provider. Document Revised: 12/28/2019 Document Reviewed: 10/24/2019 Elsevier Patient Education  2022 [employer].

## 2021-10-23 ENCOUNTER — Encounter: Payer: Self-pay | Admitting: Physical Therapy

## 2021-10-23 ENCOUNTER — Ambulatory Visit: Payer: Medicaid Other | Admitting: Physical Therapy

## 2021-10-23 DIAGNOSIS — M6281 Muscle weakness (generalized): Secondary | ICD-10-CM

## 2021-10-23 DIAGNOSIS — R2689 Other abnormalities of gait and mobility: Secondary | ICD-10-CM

## 2021-10-23 DIAGNOSIS — Z89512 Acquired absence of left leg below knee: Secondary | ICD-10-CM | POA: Diagnosis not present

## 2021-10-23 NOTE — Therapy (Signed)
[facility] [address] [place], [place], [address] Phone: [phone]   Fax:  [phone]  Physical Therapy Treatment  Patient Details  Name: Sarah Long MRN: [id number] Date of Birth: [birth year] Referring Provider (PT): Dr. Sharol Given   Encounter Date: 10/23/2021   PT End of Session - 10/23/21 1154     Visit Number 3    Number of Visits 17    Date for PT Re-Evaluation 12/02/21    Authorization Type CCME    Authorization Time Period Auth submitted 11/9- approved 3 visits from 10/16/21- 11/05/21    Authorization - Visit Number 1    Authorization - Number of Visits 3    PT Start Time 1102    PT Stop Time 1144    PT Time Calculation (min) 42 min    Equipment Utilized During Treatment Gait belt    Activity Tolerance Patient tolerated treatment well;No increased pain    Behavior During Therapy [facility] for tasks assessed/performed             Past Medical History:  Diagnosis Date   Abnormal Pap smear 1998   Abnormal vaginal bleeding 12/19/2011   Acute urinary retention 06/26/2021   Arthritis    Bacterial infection    Bipolar 1 disorder (HCC)    Blister of second toe of left foot 11/21/2016   Candida vaginitis 07/2007   Depression    recently added wellbutrin-has not taken yet for bipolar   Diabetes in pregnancy    Diabetes mellitus    nph 20U qam and qpm, regular with meals   Diabetic ketoacidosis without coma associated with type 2 diabetes mellitus ([place])    Fibroid    Galactorrhea of right breast 2008   GERD (gastroesophageal reflux disease)    H/O amenorrhea 07/2007   H/O dizziness 10/14/2011   H/O dysmenorrhea 2010   H/O menorrhagia 10/14/2011   H/O varicella    Headache(784.0)    Heavy vaginal bleeding due to contraceptive injection use 10/12/2011   Depo Provera   Herpes    HSV-2 infection 01/03/2009   Hx: UTI (urinary tract infection) 2009   Hypertension    on aldomet   Increased BMI 2010    Irregular uterine bleeding 04/04/2012   Pt has mirena    Obesity 10/14/2011   Oligomenorrhea 07/2007   Pelvic pain in female    Presence of permanent cardiac pacemaker    Preterm labor    Trichomonas    Yeast infection     Past Surgical History:  Procedure Laterality Date   ABDOMINAL AORTOGRAM W/LOWER EXTREMITY N/A 06/17/2021   Procedure: ABDOMINAL AORTOGRAM W/LOWER EXTREMITY;  Surgeon: Wellington Hampshire, MD;  Location: [facility];  Service: Cardiovascular;  Laterality: N/A;   AMPUTATION Left 06/27/2021   Procedure: AMPUTATION BELOW KNEE;  Surgeon: Newt Minion, MD;  Location: [facility];  Service: Orthopedics;  Laterality: Left;   AMPUTATION Right 09/04/2021   Procedure: RIGHT GREAT TOE AMPUTATION;  Surgeon: Newt Minion, MD;  Location: [facility];  Service: Orthopedics;  Laterality: Right;   CESAREAN SECTION  1991   LEFT HEART CATH AND CORONARY ANGIOGRAPHY N/A 02/10/2020   Procedure: LEFT HEART CATH AND CORONARY ANGIOGRAPHY;  Surgeon: Troy Sine, MD;  Location: [facility];  Service: Cardiovascular;  Laterality: N/A;   PACEMAKER IMPLANT N/A 02/13/2020   Procedure: PACEMAKER IMPLANT;  Surgeon: Constance Haw, MD;  Location: [facility];  Service: Cardiovascular;  Laterality: N/A;  TEMPORARY PACEMAKER N/A 02/10/2020   Procedure: TEMPORARY PACEMAKER;  Surgeon: Troy Sine, MD;  Location: [facility];  Service: Cardiovascular;  Laterality: N/A;    There were no vitals filed for this visit.   Subjective Assessment - 10/23/21 1104     Subjective No new complaints. Want's the wound to be checked.    Pertinent History L BKA, R big toe amputation    Limitations Standing;Walking;House hold activities    Currently in Pain? No/denies    Pain Score 0-No pain                   [facility] Adult PT Treatment/Exercise - 10/23/21 1115       Transfers   Transfers Sit to Stand;Stand to Sit    Sit to Stand 5: Supervision;With upper extremity assist;From  chair/3-in-1    Stand to Sit 5: Supervision;With upper extremity assist;To chair/3-in-1      Ambulation/Gait   Ambulation/Gait Yes    Ambulation/Gait Assistance 5: Supervision    Ambulation/Gait Assistance Details pt with slight antalgic gait pattern with decreased stance on left prosthesis. able to correct with cues.    Ambulation Distance (Feet) 350 Feet   x1, plus around clinic with session   Assistive device None;Prosthesis    Gait Pattern Step-through pattern;Decreased stride length;Decreased stance time - left;Decreased step length - right;Decreased weight shift to left;Left steppage;Lateral hip instability;Narrow base of support;Decreased trunk rotation;Antalgic    Ambulation Surface Level;Indoor    Stairs --    Ramp Other (comment)   min guard assist   Ramp Details (indicate cue type and reason) with prosthesis only working on correct sequencing and techniquex 4 reps with min guard assist.      Neuro Re-ed    Neuro Re-ed Details  for balance/muscle re-ed: gait around track working on scanning all directions randomly with veering noted, min guard assist for safety.      Prosthetics   Prosthetic Care Comments  continued with prosthetic education: reinforced wound care. Continued to provide education on sock ply management with pt trying too many, then too few to see how prosthesis feels with each vs correct fit; pt educated on changing shoes and how heel>toe depth will affect knee if differnent that the height of current shoes that prosthesis was adjusted too. Pt educated on how to adjust walking pattern to accomodate changes at knee if shoe height is different.    Current prosthetic wear tolerance (days/week)  daily    Current prosthetic wear tolerance (#hours/day)  most awake hours with breaks during day to dry skin or due to increased pain.    Residual limb condition  small wound at distal end of limb along incision remains open. Able to measure depth today 0.4 cm. Pt also noted to  have a suture worked to surface at medial aspect of incision. Remove    Education Provided Residual limb care;Correct ply sock adjustment;Proper wear schedule/adjustment;Proper weight-bearing schedule/adjustment    Person(s) Educated Patient    Education Method Explanation;Demonstration;Verbal cues;Handout    Education Method Verbalized understanding;Returned demonstration;Verbal cues required;Needs further instruction    Donning Prosthesis Supervision                   PT Short Term Goals - 10/08/21 1539       PT SHORT TERM GOAL #1   Title Patient will verbalize proper instructions for sweat management, sock management, residual limb and prosthetic leg care to improve functional independence with prosthetic leg.  Baseline Education initiated (10/07/21)    Time 4    Period Weeks    Status New    Target Date 11/05/21      PT SHORT TERM GOAL #2   Title Patient will be able to go up and down curb step without AD and SBA to improve community access    Baseline not attempted    Time 4    Period Weeks    Status New    Target Date 11/05/21               PT Long Term Goals - 10/08/21 1551       PT LONG TERM GOAL #1   Title Patient will demo 20/30 on FGA to improve functional gait and ambulation    Baseline 10/30 (10/07/21)    Time 8    Period Weeks    Status New    Target Date 12/02/21      PT LONG TERM GOAL #2   Title Pt will demo at least 0.50m/s gait speed without AD to improve commnity ambulation    Baseline 0.5 m/s (10/07/21)    Time 8    Period Weeks    Status New    Target Date 12/02/21                   Plan - 10/23/21 1155     Clinical Impression Statement Today's skilled session continued to focus on prosthetic education and gait/barriers/balance with prosthesis only. Up to min assist at times for balance with challenges. No other issues noted or reported in session. The pt is making steady progress toward goals and should benefit from  continued PT to progress toward unmet goals.    Personal Factors and Comorbidities Past/Current Experience;Comorbidity 2    Comorbidities L BKA, R big toe amputation    Examination-Activity Limitations Lift;Squat;Stairs;Stand    Examination-Participation Restrictions Cleaning;Community Activity;Occupation;Shop;Yard Work    Stability/Clinical Decision Making Stable/Uncomplicated    Rehab Potential Good    PT Frequency 2x / week    PT Duration 8 weeks    PT Treatment/Interventions ADLs/Self Care Home Management;Cryotherapy;Moist Heat;Biofeedback;Gait training;Stair training;Functional mobility training;Therapeutic activities;Therapeutic exercise;Balance training;Manual techniques;Prosthetic Training;Orthotic Fit/Training;Patient/family education;Neuromuscular re-education;Passive range of motion;Scar mobilization;Energy conservation;Joint Manipulations    PT Next Visit Plan Continue with prosthetic education, sock management, sweat management. 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    Consulted and Agree with Plan of Care Patient             Patient will benefit from skilled therapeutic intervention in order to improve the following deficits and impairments:  Abnormal gait, Decreased activity tolerance, Decreased balance, Decreased coordination, Decreased range of motion, Decreased mobility, Decreased endurance, Decreased skin integrity, Decreased strength, Difficulty walking, Impaired flexibility, Increased edema, Impaired sensation, Postural dysfunction, Pain, Prosthetic Dependency  Visit Diagnosis: Other abnormalities of gait and mobility  Muscle weakness (generalized)     Problem List Patient Active Problem List   Diagnosis Date Noted   Class 2 severe obesity due to excess calories with serious comorbidity and body mass  index (BMI) of 36.0 to 36.9 in adult [facility]) 10/15/2021   Hypoglycemia 10/14/2021   Elevated blood pressure reading with diagnosis of hypertension 10/07/2021   Insomnia 10/07/2021   Localized edema 10/07/2021   Right toe amputee ([place])    Left below-knee amputee ([place]) 07/01/2021  Protein-calorie malnutrition ([place]) 06/26/2021   Hypoalbuminemia due to protein-calorie malnutrition ([place]) 06/26/2021   Normocytic anemia 06/26/2021   Heart failure with mid-range ejection fraction ([place]) 06/26/2021   Diabetic nephropathy with proteinuria ([place]) 06/26/2021   Diabetic foot ulcer ([place]) 05/26/2021   Bacterial vaginosis 07/17/2020   Dental caries 04/01/2020   Cracked tooth 04/01/2020   Alcohol use 03/11/2020   Vitamin D deficiency 03/11/2020   Prolonged QT interval 02/26/2020   Chronic combined systolic and diastolic CHF (congestive heart failure) ([place]) 02/26/2020   Heart block AV complete ([place]) 02/11/2020   Exocrine pancreatic insufficiency    Dysmenorrhea 10/02/2019   DM type 2 with diabetic peripheral neuropathy ([place]) 05/30/2019   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 block) 05/29/2019   Bilateral bunions 05/29/2019   Type 2 diabetes mellitus ([place]) 10/21/2016   Smoking 11/26/2013   Hypercholesteremia 02/02/2007   Bipolar 1 disorder ([place]) 02/02/2007   HYPERTENSION, BENIGN SYSTEMIC 02/02/2007    Willow Ora, PTA, [facility] Outpatient Neuro [facility] [address] [place], [place] [address] [phone] 10/23/21, 11:57 AM   Name: Sarah Long MRN: [id number] Date of Birth: 04/14/1971

## 2021-10-26 ENCOUNTER — Ambulatory Visit (INDEPENDENT_AMBULATORY_CARE_PROVIDER_SITE_OTHER): Payer: Medicaid Other | Admitting: Orthopedic Surgery

## 2021-10-26 ENCOUNTER — Other Ambulatory Visit: Payer: Self-pay

## 2021-10-26 DIAGNOSIS — Z89512 Acquired absence of left leg below knee: Secondary | ICD-10-CM

## 2021-10-26 DIAGNOSIS — Z89411 Acquired absence of right great toe: Secondary | ICD-10-CM

## 2021-10-28 ENCOUNTER — Encounter: Payer: Self-pay | Admitting: Physical Therapy

## 2021-10-28 ENCOUNTER — Other Ambulatory Visit: Payer: Self-pay

## 2021-10-28 ENCOUNTER — Ambulatory Visit: Payer: Medicaid Other | Admitting: Physical Therapy

## 2021-10-28 DIAGNOSIS — R2689 Other abnormalities of gait and mobility: Secondary | ICD-10-CM

## 2021-10-28 DIAGNOSIS — Z89512 Acquired absence of left leg below knee: Secondary | ICD-10-CM | POA: Diagnosis not present

## 2021-10-28 DIAGNOSIS — M6281 Muscle weakness (generalized): Secondary | ICD-10-CM

## 2021-10-28 NOTE — Therapy (Signed)
[facility] [address] [place], [place], [address] Phone: [phone]   Fax:  [phone]  Physical Therapy Treatment  Patient Details  Name: Sarah Long MRN: [id number] Date of Birth: 1971-06-26 Referring Provider (PT): Dr. Sharol Given   Encounter Date: 10/28/2021   PT End of Session - 10/28/21 0916     Visit Number 4    Number of Visits 17    Date for PT Re-Evaluation 12/02/21    Authorization Type CCME    Authorization Time Period Auth submitted 11/9- approved 3 visits from 10/16/21- 11/05/21    Authorization - Visit Number 2    Authorization - Number of Visits 3    PT Start Time 0806    PT Stop Time 0850    PT Time Calculation (min) 44 min    Equipment Utilized During Treatment Gait belt    Activity Tolerance Patient tolerated treatment well;No increased pain    Behavior During Therapy [facility] for tasks assessed/performed             Past Medical History:  Diagnosis Date   Abnormal Pap smear 1998   Abnormal vaginal bleeding 12/19/2011   Acute urinary retention 06/26/2021   Arthritis    Bacterial infection    Bipolar 1 disorder (HCC)    Blister of second toe of left foot 11/21/2016   [place]ida vaginitis 07/2007   Depression    recently added wellbutrin-has not taken yet for bipolar   Diabetes in pregnancy    Diabetes mellitus    nph 20U qam and qpm, regular with meals   Diabetic ketoacidosis without coma associated with type 2 diabetes mellitus ([place])    Fibroid    Galactorrhea of right breast 2008   GERD (gastroesophageal reflux disease)    H/O amenorrhea 07/2007   H/O dizziness 10/14/2011   H/O dysmenorrhea 2010   H/O menorrhagia 10/14/2011   H/O varicella    Headache(784.0)    Heavy vaginal bleeding due to contraceptive injection use 10/12/2011   Depo Provera   Herpes    HSV-2 infection 01/03/2009   Hx: UTI (urinary tract infection) 2009   Hypertension    on aldomet   Increased BMI 2010    Irregular uterine bleeding 04/04/2012   Pt has mirena    Obesity 10/14/2011   Oligomenorrhea 07/2007   Pelvic pain in female    Presence of permanent cardiac pacemaker    Preterm labor    Trichomonas    Yeast infection     Past Surgical History:  Procedure Laterality Date   ABDOMINAL AORTOGRAM W/LOWER EXTREMITY N/A 06/17/2021   Procedure: ABDOMINAL AORTOGRAM W/LOWER EXTREMITY;  Surgeon: Wellington Hampshire, MD;  Location: [facility];  Service: Cardiovascular;  Laterality: N/A;   AMPUTATION Left 06/27/2021   Procedure: AMPUTATION BELOW KNEE;  Surgeon: Newt Minion, MD;  Location: [facility];  Service: Orthopedics;  Laterality: Left;   AMPUTATION Right 09/04/2021   Procedure: RIGHT GREAT TOE AMPUTATION;  Surgeon: Newt Minion, MD;  Location: [facility];  Service: Orthopedics;  Laterality: Right;   CESAREAN SECTION  1991   LEFT HEART CATH AND CORONARY ANGIOGRAPHY N/A 02/10/2020   Procedure: LEFT HEART CATH AND CORONARY ANGIOGRAPHY;  Surgeon: Troy Sine, MD;  Location: [facility];  Service: Cardiovascular;  Laterality: N/A;   PACEMAKER IMPLANT N/A 02/13/2020   Procedure: PACEMAKER IMPLANT;  Surgeon: Constance Haw, MD;  Location: [facility];  Service: Cardiovascular;  Laterality: N/A;  TEMPORARY PACEMAKER N/A 02/10/2020   Procedure: TEMPORARY PACEMAKER;  Surgeon: Troy Sine, MD;  Location: [facility];  Service: Cardiovascular;  Laterality: N/A;    There were no vitals filed for this visit.   Subjective Assessment - 10/28/21 0808     Subjective No new complaints. "Dr Sharol Given said that the little open area on my limb looks good."    Pertinent History L BKA, R big toe amputation    Limitations Standing;Walking;House hold activities    Currently in Pain? No/denies               [facility] Adult PT Treatment/Exercise - 10/28/21 0817       Transfers   Transfers Sit to Stand;Stand to Sit    Sit to Stand 5: Supervision;With upper extremity assist;From  chair/3-in-1    Stand to Sit 5: Supervision;With upper extremity assist;To chair/3-in-1      Ambulation/Gait   Ambulation/Gait Yes    Ambulation/Gait Assistance 5: Supervision    Ambulation Distance (Feet) --   throughout session   Assistive device None;Prosthesis    Gait Pattern Step-through pattern;Decreased stride length;Decreased stance time - left;Decreased step length - right;Decreased weight shift to left;Left steppage;Lateral hip instability;Narrow base of support;Decreased trunk rotation;Antalgic    Ambulation Surface Level;Indoor      High Level Balance   High Level Balance Activities Marching forwards;Backward walking    High Level Balance Comments in // bars forward marching then backward walking 3 x down and back. The pt required min guard and single UE support.      Exercises   Exercises Knee/Hip      Knee/Hip Exercises: Stretches   Gastroc Stretch Right;2 reps;30 seconds    Gastroc Stretch Limitations Standing gastroc stretch on R 2 x 30 sec. Verbally added to HEP.      Knee/Hip Exercises: Aerobic   Other Aerobic Scifit level 2 for 8 min for LE strength and activity tolerance      Prosthetics   Prosthetic Care Comments  applied/educated pt on baby oil on thigh for helping dryin/itchy skin and antiperspirant for residual limb. Provided pt with another large ViveWear sock to wear under liner as she only has 1 at home.   Current prosthetic wear tolerance (days/week)  daily    Current prosthetic wear tolerance (#hours/day)  8-12 hours with breaks as needed.    Residual limb condition  small wound at lateral end of limb along lateral incision remains open, though healing. Pt noted to have another small wound at distal end of residual limb at center of incision. Tegaderm applied to cover wounds. Pt also wearing Vive wear sock under liner to assist with wound healing.    Education Provided Skin check;Residual limb care;Other (comment)    Person(s) Educated Patient    Education  Method Explanation;Demonstration    Education Method Verbalized understanding    Donning Prosthesis Supervision                 Balance Exercises - 10/28/21 0853       Balance Exercises: Standing   Standing Eyes Opened Wide ([place]);Foam/compliant surface;Head turns;Other reps (comment);Limitations    Standing Eyes Opened Limitations In the // bars pt performed standing on airex with wide BOS and EO while doing left/right then up/down head turns x 10 each. The pt required min guard and intermittent use of UE's to maintain balance.    Standing Eyes Closed Wide (BOA);Foam/compliant surface;2 reps;30 secs;Limitations    Standing Eyes Closed Limitations Pt  placed in // bars. Pt performed standing on Airex with EC 2 x 30 sec. The pt required min guard and intermittent use of UE's to maintain balance.                PT Education - 10/28/21 0928     Education Details Verbally added standing gastroc stretch to HEP    Person(s) Educated Patient    Methods Explanation;Demonstration;Verbal cues    Comprehension Verbalized understanding;Returned demonstration              PT Short Term Goals - 10/08/21 1539       PT SHORT TERM GOAL #1   Title Patient will verbalize proper instructions for sweat management, sock management, residual limb and prosthetic leg care to improve functional independence with prosthetic leg.    Baseline Education initiated (10/07/21)    Time 4    Period Weeks    Status New    Target Date 11/05/21      PT SHORT TERM GOAL #2   Title Patient will be able to go up and down curb step without AD and SBA to improve community access    Baseline not attempted    Time 4    Period Weeks    Status New    Target Date 11/05/21               PT Long Term Goals - 10/08/21 1551       PT LONG TERM GOAL #1   Title Patient will demo 20/30 on FGA to improve functional gait and ambulation    Baseline 10/30 (10/07/21)    Time 8    Period Weeks    Status  New    Target Date 12/02/21      PT LONG TERM GOAL #2   Title Pt will demo at least 0.26m/s gait speed without AD to improve commnity ambulation    Baseline 0.5 m/s (10/07/21)    Time 8    Period Weeks    Status New    Target Date 12/02/21              Plan - 10/28/21 0919     Clinical Impression Statement Today's skilled session was focused on prosthetic education and begining exercises for balance and activity tolerance. The pt performed interventions with no issues noted and rest breaks as needed The pt should continue to benefit from further skilled PT to address functional deficits.    Personal Factors and Comorbidities Past/Current Experience;Comorbidity 2    Comorbidities L BKA, R big toe amputation    Examination-Activity Limitations Lift;Squat;Stairs;Stand    Examination-Participation Restrictions Cleaning;Community Activity;Occupation;Shop;Yard Work    Stability/Clinical Decision Making Stable/Uncomplicated    Rehab Potential Good    PT Frequency 2x / week    PT Duration 8 weeks    PT Treatment/Interventions ADLs/Self Care Home Management;Cryotherapy;Moist Heat;Biofeedback;Gait training;Stair training;Functional mobility training;Therapeutic activities;Therapeutic exercise;Balance training;Manual techniques;Prosthetic Training;Orthotic Fit/Training;Patient/family education;Neuromuscular re-education;Passive range of motion;Scar mobilization;Energy conservation;Joint Manipulations    PT Next Visit Plan Continue with prosthetic education, sock management, sweat management. Work on Aeronautical engineer with equal stance time/step length and normalized base of support (pt tends to be wide). continue to work on balance with decreased UE support.                 Patient will benefit from skilled therapeutic intervention in order to improve the following deficits and impairments:  Abnormal gait, Decreased activity tolerance, Decreased balance, Decreased coordination, Decreased  range of  motion, Decreased mobility, Decreased endurance, Decreased skin integrity, Decreased strength, Difficulty walking, Impaired flexibility, Increased edema, Impaired sensation, Postural dysfunction, Pain, Prosthetic Dependency  Visit Diagnosis: Other abnormalities of gait and mobility  Muscle weakness (generalized)     Problem List Patient Active Problem List   Diagnosis Date Noted   Class 2 severe obesity due to excess calories with serious comorbidity and body mass index (BMI) of 36.0 to 36.9 in adult [facility]) 10/15/2021   Hypoglycemia 10/14/2021   Elevated blood pressure reading with diagnosis of hypertension 10/07/2021   Insomnia 10/07/2021   Localized edema 10/07/2021   Right toe amputee ([place])    Left below-knee amputee ([place]) 07/01/2021   Protein-calorie malnutrition ([place]) 06/26/2021   Hypoalbuminemia due to protein-calorie malnutrition ([place]) 06/26/2021   Normocytic anemia 06/26/2021   Heart failure with mid-range ejection fraction ([place]) 06/26/2021   Diabetic nephropathy with proteinuria ([place]) 06/26/2021   Diabetic foot ulcer ([place]) 05/26/2021   Bacterial vaginosis 07/17/2020   Dental caries 04/01/2020   Cracked tooth 04/01/2020   Alcohol use 03/11/2020   Vitamin D deficiency 03/11/2020   Prolonged QT interval 02/26/2020   Chronic combined systolic and diastolic CHF (congestive heart failure) ([place]) 02/26/2020   Heart block AV complete ([place]) 02/11/2020   Exocrine pancreatic insufficiency    Dysmenorrhea 10/02/2019   DM type 2 with diabetic peripheral neuropathy ([place]) 05/30/2019   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 block) 05/29/2019   Bilateral bunions 05/29/2019   Type 2 diabetes mellitus ([place]) 10/21/2016   Smoking 11/26/2013   Hypercholesteremia 02/02/2007   Bipolar 1 disorder ([place]) 02/02/2007   HYPERTENSION, BENIGN SYSTEMIC 02/02/2007    Rondel Baton, SPTA 10/28/2021, 9:29 AM  [facility] [address] [place], [place], [address] Phone: [phone]   Fax:  [phone]  Name: Sarah Long MRN: [id number] Date of Birth: [birth year]  This note has been reviewed and edited by supervising CI.   Willow Ora, PTA, [facility] [address] [place], [place] [address] [phone] 10/28/21, 11:11 AM

## 2021-11-02 ENCOUNTER — Ambulatory Visit (INDEPENDENT_AMBULATORY_CARE_PROVIDER_SITE_OTHER): Payer: Medicaid Other | Admitting: Internal Medicine

## 2021-11-02 ENCOUNTER — Other Ambulatory Visit: Payer: Self-pay

## 2021-11-02 ENCOUNTER — Encounter: Payer: Self-pay | Admitting: Internal Medicine

## 2021-11-02 VITALS — BP 144/96 | HR 86 | Ht 64.0 in | Wt 212.0 lb

## 2021-11-02 DIAGNOSIS — E1122 Type 2 diabetes mellitus with diabetic chronic kidney disease: Secondary | ICD-10-CM

## 2021-11-02 DIAGNOSIS — E1142 Type 2 diabetes mellitus with diabetic polyneuropathy: Secondary | ICD-10-CM | POA: Diagnosis not present

## 2021-11-02 DIAGNOSIS — Z89512 Acquired absence of left leg below knee: Secondary | ICD-10-CM

## 2021-11-02 DIAGNOSIS — N181 Chronic kidney disease, stage 1: Secondary | ICD-10-CM

## 2021-11-02 DIAGNOSIS — Z794 Long term (current) use of insulin: Secondary | ICD-10-CM

## 2021-11-02 DIAGNOSIS — E1165 Type 2 diabetes mellitus with hyperglycemia: Secondary | ICD-10-CM

## 2021-11-02 DIAGNOSIS — Z89411 Acquired absence of right great toe: Secondary | ICD-10-CM

## 2021-11-02 MED ORDER — INSULIN PEN NEEDLE 31G X 5 MM MISC
1.0000 | Freq: Four times a day (QID) | 3 refills | Status: DC
  Filled 2021-11-02: qty 100, 25d supply, fill #0

## 2021-11-02 MED ORDER — INSULIN LISPRO (1 UNIT DIAL) 100 UNIT/ML (KWIKPEN)
10.0000 [IU] | PEN_INJECTOR | Freq: Three times a day (TID) | SUBCUTANEOUS | 3 refills | Status: DC
  Filled 2021-11-02: qty 9, 30d supply, fill #0

## 2021-11-02 MED ORDER — LANTUS SOLOSTAR 100 UNIT/ML ~~LOC~~ SOPN
35.0000 [IU] | PEN_INJECTOR | Freq: Every day | SUBCUTANEOUS | 6 refills | Status: DC
  Filled 2021-11-02 – 2021-12-28 (×2): qty 30, 85d supply, fill #0

## 2021-11-02 MED ORDER — DEXCOM G6 TRANSMITTER MISC: 1.0000 | 3 refills | Status: DC

## 2021-11-02 MED ORDER — DEXCOM G6 SENSOR MISC: 1.0000 | 3 refills | Status: DC

## 2021-11-02 NOTE — Patient Instructions (Signed)
Continue Lantus 35 units daily  Continue Humalog 10 units with each meal  Humalog correctional insulin: ADD extra units on insulin to your meal-time Humalog dose if your blood sugars are higher than 155. Use the scale below to help guide you:   Blood sugar before meal Number of units to inject  Less than 155 0 unit   156 -  180 1 units  181 -  205 2 units  206 -  230 3 units  231 -  255 4 units  256 -  280 5 units  281 -  305 6 units  306 -  330 7 units  331 -  355 8 units   HOW TO TREAT LOW BLOOD SUGARS (Blood sugar LESS THAN 70 MG/DL) Please follow the RULE OF 15 for the treatment of hypoglycemia treatment (when your (blood sugars are less than 70 mg/dL)   STEP 1: Take 15 grams of carbohydrates when your blood sugar is low, which includes:  3-4 GLUCOSE TABS  OR 3-4 OZ OF JUICE OR REGULAR SODA OR ONE TUBE OF GLUCOSE GEL    STEP 2: RECHECK blood sugar in 15 MINUTES STEP 3: If your blood sugar is still low at the 15 minute recheck --> then, go back to STEP 1 and treat AGAIN with another 15 grams of carbohydrates.

## 2021-11-02 NOTE — Progress Notes (Signed)
Name: Sarah Long  MRN/ DOB: [id number], 1971/02/17   Age/ Sex: 50 y.o., female    PCP: [name], MD   Reason for Endocrinology Evaluation: Type 2 Diabetes Mellitus     Date of Initial Endocrinology Visit: 11/02/2021     PATIENT IDENTIFIER: Sarah Long is a 50 y.o. female with a past medical history of HTN , Bipolar d/o,and T2DM, has Hx of Pancreatitis . The patient presented for initial endocrinology clinic visit on 11/02/2021 for consultative assistance with her diabetes management.    HPI: Ms. Manka was    Diagnosed with DM at age 75 stated as gestational diabetes by age 43 she became diagnosed with T2 DM  Prior Medications tried/Intolerance: Intolerant to Metformin.  Currently checking blood sugars 1 x / day,  before breakfast Hypoglycemia episodes : yes               Symptoms: yes                 Frequency: 2-3/ month Hemoglobin A1c has ranged from 7.4% in 2022, peaking at 13.8% in 2014. Patient required assistance for hypoglycemia:  Patient has required hospitalization within the last 1 year from hyper or hypoglycemia: DKA 06/2021  In terms of diet, the patient eats 2 meals a day, drinks sugar-sweetened beverages    Has diarrhea occasionally but no nausea or vomiting    HOME DIABETES REGIMEN: Lantus 35 units daily  Humalog 10 units TID QAC   Statin: no ACE-I/ARB: no Prior Diabetic Education: yes   METER DOWNLOAD SUMMARY: Date range evaluated: 11/14-11/28/2022  Average Number Tests/Day = 1 Overall Mean FS Glucose = 153 Standard Deviation = 37  BG Ranges: Low = 110 High = 229   Hypoglycemic Events/30 Days: BG < 50 = 0 Episodes of symptomatic severe hypoglycemia = 0   DIABETIC COMPLICATIONS: Microvascular complications:  Left BKA, Right  great toe amputation , neuropathy  Denies: CKD, retinopathy  Last eye exam: Completed 01/2021  Macrovascular complications:  PAD Denies: CAD, CVA   PAST HISTORY: Past Medical History:  Past  Medical History:  Diagnosis Date  . Abnormal Pap smear 1998  . Abnormal vaginal bleeding 12/19/2011  . Acute urinary retention 06/26/2021  . Arthritis   . Bacterial infection   . Bipolar 1 disorder ([place])   . Blister of second toe of left foot 11/21/2016  . Candida vaginitis 07/2007  . Depression    recently added wellbutrin-has not taken yet for bipolar  . Diabetes in pregnancy   . Diabetes mellitus    nph 20U qam and qpm, regular with meals  . Diabetic ketoacidosis without coma associated with type 2 diabetes mellitus ([place])   . Fibroid   . Galactorrhea of right breast 2008  . GERD (gastroesophageal reflux disease)   . H/O amenorrhea 07/2007  . H/O dizziness 10/14/2011  . H/O dysmenorrhea 2010  . H/O menorrhagia 10/14/2011  . H/O varicella   . Headache(784.0)   . Heavy vaginal bleeding due to contraceptive injection use 10/12/2011   Depo Provera  . Herpes   . HSV-2 infection 01/03/2009  . Hx: UTI (urinary tract infection) 2009  . Hypertension    on aldomet  . Increased BMI 2010  . Irregular uterine bleeding 04/04/2012   Pt has mirena   . Obesity 10/14/2011  . Oligomenorrhea 07/2007  . Pelvic pain in female   . Presence of permanent cardiac pacemaker   . Preterm labor   . Trichomonas   .  Yeast infection    Past Surgical History:  Past Surgical History:  Procedure Laterality Date  . ABDOMINAL AORTOGRAM W/LOWER EXTREMITY N/A 06/17/2021   Procedure: ABDOMINAL AORTOGRAM W/LOWER EXTREMITY;  Surgeon: Iran Ouch, MD;  [facility];  Service: Cardiovascular;  Laterality: N/A;  . AMPUTATION Left 06/27/2021   Procedure: AMPUTATION BELOW KNEE;  Surgeon: Nadara Mustard, MD;  Location: [facility] OR;  Service: Orthopedics;  Laterality: Left;  . AMPUTATION Right 09/04/2021   Procedure: RIGHT GREAT TOE AMPUTATION;  Surgeon: Nadara Mustard, MD;  Location: [facility] OR;  Service: Orthopedics;  Laterality: Right;  . CESAREAN SECTION  1991  . LEFT HEART CATH AND CORONARY  ANGIOGRAPHY N/A 02/10/2020   Procedure: LEFT HEART CATH AND CORONARY ANGIOGRAPHY;  Surgeon: Lennette Bihari, MD;  [facility];  Service: Cardiovascular;  Laterality: N/A;  . PACEMAKER IMPLANT N/A 02/13/2020   Procedure: PACEMAKER IMPLANT;  Surgeon: Regan Lemming, MD;  [facility];  Service: Cardiovascular;  Laterality: N/A;  . TEMPORARY PACEMAKER N/A 02/10/2020   Procedure: TEMPORARY PACEMAKER;  Surgeon: Lennette Bihari, MD;  Location: [facility] INVASIVE CV LAB;  Service: Cardiovascular;  Laterality: N/A;    Social History:  reports that she quit smoking about 4 months ago. Her smoking use included cigarettes. She has a 10.00 pack-year smoking history. She has never used smokeless tobacco. She reports that she does not drink alcohol and does not use drugs. Family History:  Family History  Problem Relation Age of Onset  . Hypertension Mother   . Diabetes Mother   . Heart disease Mother   . Hypertension Father   . Diabetes Father   . Heart disease Father   . Stroke Maternal Grandfather   . Other Neg Hx      HOME MEDICATIONS: Allergies as of 11/02/2021       Reactions   Strawberry Extract Hives   Sulfa Antibiotics Hives, Itching        Medication List        Accurate as of November 02, 2021  3:10 PM. If you have any questions, ask your nurse or doctor.          STOP taking these medications    glucose 4 GM chewable tablet Stopped by: Scarlette Shorts, MD   melatonin 3 MG Tabs tablet Stopped by: Scarlette Shorts, MD   rosuvastatin 10 MG tablet Commonly known as: CRESTOR Stopped by: Scarlette Shorts, MD   Zenpep 20000-63000 units Cpep Generic drug: Pancrelipase (Lip-Prot-Amyl) Stopped by: Scarlette Shorts, MD       TAKE these medications    Accu-Chek Guide test strip Generic drug: glucose blood Use as instructed up to 4 times daily   Accu-Chek Guide w/Device Kit Use as directed   Accu-Chek Softclix Lancets  lancets Use as directed up to 4 times daily   ADULT ONE DAILY GUMMIES PO Take 1 capsule by mouth daily.   carvedilol 6.25 MG tablet Commonly known as: COREG Take 1 tablet (6.25 mg total) by mouth 2 (two) times daily with a meal.   furosemide 20 MG tablet Commonly known as: LASIX Take 1 tablet (20 mg total) by mouth daily.   HumaLOG KwikPen 100 UNIT/ML KwikPen Generic drug: insulin lispro Inject 10 Units into the skin 3 (three) times daily before meals.   HYDROcodone-acetaminophen 5-325 MG tablet Commonly known as: NORCO/VICODIN Take 1 tablet by mouth every 4 (four) hours as needed for moderate pain.  Lantus SoloStar 100 UNIT/ML Solostar Pen Generic drug: insulin glargine Inject 35 Units into the skin daily.   TechLite Pen Needles 32G X 4 MM Misc Generic drug: Insulin Pen Needle Use as directed with insulin pens         ALLERGIES: Allergies  Allergen Reactions  . Strawberry Extract Hives  . Sulfa Antibiotics Hives and Itching     REVIEW OF SYSTEMS: A comprehensive ROS was conducted with the patient and is negative except as per HPI     OBJECTIVE:   VITAL SIGNS: BP (!) 144/96 (BP Location: Left Arm, Patient Position: Sitting, Cuff Size: Small)   Pulse 86   Ht $R'5\' 4"'Lz$  (1.626 m)   Wt 212 lb (96.2 kg)   SpO2 99%   BMI 36.39 kg/m    PHYSICAL EXAM:  General: Pt appears well and is in NAD  Neck: General: Supple without adenopathy or carotid bruits. Thyroid: Thyroid size normal.  No goiter or nodules appreciated.  Lungs: Clear with good BS bilat with no rales, rhonchi, or wheezes  Heart: RRR with normal S1 and S2 and no gallops; no murmurs; no rub  Abdomen: Normoactive bowel sounds, soft, nontender, without masses or organomegaly palpable  Extremities: Left BKA  Trace edema on the right   Neuro: MS is good with appropriate affect, pt is alert and Ox3      DATA REVIEWED:  Lab Results  Component Value Date   HGBA1C 7.4 (A) 10/14/2021   HGBA1C 13.2 (H)  07/02/2021   HGBA1C 13.6 (A) 06/18/2021   Lab Results  Component Value Date   MICROALBUR 27.9 12/22/2015   LDLCALC 40 06/18/2021   CREATININE 0.90 10/22/2021   Lab Results  Component Value Date   MICRALBCREAT 729 (H) 06/18/2021    Lab Results  Component Value Date   CHOL 98 (L) 06/18/2021   HDL 37 (L) 06/18/2021   LDLCALC 40 06/18/2021   TRIG 117 06/18/2021   CHOLHDL 2.6 06/18/2021        ASSESSMENT / PLAN / RECOMMENDATIONS:   1) Type 2 Diabetes Mellitus, Sub-Optimally controlled, With neuropathic complications, S/P Left BKA and Right great toe amputation  - Most recent A1c of 7.4 %. Goal A1c < 7.0 %.    Plan: GENERAL: I have discussed with the patient the pathophysiology of diabetes.. We stressed the importance of lifestyle changes including diet and exercise. I explained the complications associated with diabetes including retinopathy, nephropathy, neuropathy as well as increased risk of cardiovascular disease. We went over the benefit seen with glycemic control.  She initially indicated her interest in the Omnipod as she saw commercials. After I showed it to her and explained how this would be used, she was overwhelmed and we opted to start with Dexcom first  Pt has made lifestyle changes and I have praised her on that, we will continue current doses of insulin but I will provide her with a correction scale   MEDICATIONS: Continue Lantus 35 units daily  Continue Humalog 10 units TIDQAC Correction Factor: Humalog (BG -130/25)   EDUCATION / INSTRUCTIONS: BG monitoring instructions: Patient is instructed to check her blood sugars 3 times a day, before meals . Call [facility] [facility] if: BG persistently < 70  I reviewed the Rule of 15 for the treatment of hypoglycemia in detail with the patient. Literature supplied.   2) Diabetic complications:  Eye: Does not have known diabetic retinopathy.  Neuro/ Feet: Does  have known diabetic peripheral neuropathy. Renal:  Patient does not  have known baseline CKD. She is not on an ACEI/ARB at present.  3) Lipids:   - She has been prescribed Rosuvastatin but has not started taking it yet. Tg and LDL are normal.         Signed electronically by: Mack Guise, MD  [facility]  [facility] [address] [place], [place] [address] Phone: [phone] FAX: [phone]   CC: [name], MD [address] [place] [address] Phone: [phone]  Fax: [phone]    Return to Endocrinology clinic as below: Future Appointments  Date Time Provider [place]  11/04/2021  8:45 AM Kerrie Pleasure, PT [facility]-NR [facility]  11/06/2021  8:45 AM Drema Balzarine, PTA [facility]NR [facility]  11/10/2021  9:40 AM Ranell Patrick, Clide Deutscher, MD CPR-PRMA CPR  11/11/2021  8:45 AM Kerrie Pleasure, PT [facility]NR [facility]  11/13/2021  8:45 AM Drema Balzarine, PTA [facility]NR [facility]  11/17/2021  9:35 AM MC-CV CH ECHO 5 [facility]-SITE3ECHO [facility]  11/18/2021  8:45 AM Drema Balzarine, PTA [facility]-NR [facility]  11/18/2021 10:00 AM [name], MD CHW-CHWW None  11/20/2021  8:45 AM Drema Balzarine, PTA [facility]-NR [facility]  12/28/2021 10:30 AM Baldwin [place], PA-C CVD-CHUSTOFF [facility]  01/08/2022  7:00 AM CVD-CHURCH DEVICE REMOTES CVD-CHUSTOFF [facility]  01/25/2022 10:30 AM Newt Minion, MD OC-GSO None  04/09/2022  7:00 AM CVD-CHURCH DEVICE REMOTES [facility]-CHUSTOFF [facility]  07/09/2022  7:00 AM CVD-CHURCH DEVICE REMOTES CVD-CHUSTOFF [facility]  10/08/2022  7:00 AM CVD-CHURCH DEVICE REMOTES CVD-CHUSTOFF [facility]  01/07/2023  7:00 AM CVD-CHURCH DEVICE REMOTES CVD-CHUSTOFF [facility]  04/08/2023  7:00 AM CVD-CHURCH DEVICE REMOTES CVD-CHUSTOFF [facility]

## 2021-11-03 ENCOUNTER — Other Ambulatory Visit (HOSPITAL_COMMUNITY): Payer: Self-pay

## 2021-11-03 ENCOUNTER — Telehealth: Payer: Self-pay | Admitting: Internal Medicine

## 2021-11-03 ENCOUNTER — Telehealth: Payer: Self-pay | Admitting: Pharmacy Technician

## 2021-11-03 DIAGNOSIS — Z89411 Acquired absence of right great toe: Secondary | ICD-10-CM | POA: Insufficient documentation

## 2021-11-03 DIAGNOSIS — E1142 Type 2 diabetes mellitus with diabetic polyneuropathy: Secondary | ICD-10-CM | POA: Insufficient documentation

## 2021-11-03 NOTE — Telephone Encounter (Signed)
Patient Advocate Encounter  Received notification from [place] that prior authorization for [place] is required.   PA submitted on 11.29.22 [facility] SENSOR: [id number] W NCTRACKS TRANSMITTER:[id number] W Status is pending   [facility] will continue to follow  Luciano Cutter, CPhT Patient Advocate [facility] Phone: [phone] Fax:  [phone]

## 2021-11-03 NOTE — Telephone Encounter (Signed)
Good afternoon! Please see telephone note. PA has been completed for pt. $0 co-pay

## 2021-11-03 NOTE — Addendum Note (Signed)
Addended by: Dorita Sciara on: 11/03/2021 12:13 PM   Modules accepted: Orders

## 2021-11-03 NOTE — Telephone Encounter (Signed)
Received notification from [place] regarding a prior authorization for DEXCOM G6 SENSOR AND TRANSMITTER. Authorization has been APPROVED from 11.29.22 to 05.28.23.   Per test claim FOR SENSOR, copay for 90 days supply is $0 Authorization# [id number]  Per test claim FOR TRANSMITTER, copay for 90 days supply is $0 Authorization # [id number]

## 2021-11-03 NOTE — Telephone Encounter (Signed)
Can you please start PA for Dexcom on this pt?  Thank you

## 2021-11-04 ENCOUNTER — Ambulatory Visit: Payer: Medicaid Other

## 2021-11-04 ENCOUNTER — Other Ambulatory Visit: Payer: Self-pay

## 2021-11-04 DIAGNOSIS — Z89512 Acquired absence of left leg below knee: Secondary | ICD-10-CM | POA: Diagnosis not present

## 2021-11-04 DIAGNOSIS — R2689 Other abnormalities of gait and mobility: Secondary | ICD-10-CM

## 2021-11-04 DIAGNOSIS — M6281 Muscle weakness (generalized): Secondary | ICD-10-CM

## 2021-11-04 NOTE — Therapy (Signed)
[facility] [address] [place], [place], [address] Phone: [phone]   Fax:  [phone]  Physical Therapy Recertification  Patient Details  Name: Sarah Long MRN: [id number] Date of Birth: [birth year] Referring Provider (PT): Dr. Sharol Given   Encounter Date: 11/04/2021   PT End of Session - 11/04/21 0851     Visit Number 5    Number of Visits 21    Date for PT Re-Evaluation 12/30/21    Authorization Type CCME    Authorization Time Period Auth submitted 11/9- approved 3 visits from 10/16/21- 11/05/21    Authorization - Visit Number 1    Authorization - Number of Visits 16    Progress Note Due on Visit 21    PT Start Time 0845    PT Stop Time 0930    PT Time Calculation (min) 45 min    Equipment Utilized During Treatment Gait belt    Activity Tolerance Patient tolerated treatment well;No increased pain    Behavior During Therapy [facility] for tasks assessed/performed             Past Medical History:  Diagnosis Date   Abnormal Pap smear 1998   Abnormal vaginal bleeding 12/19/2011   Acute urinary retention 06/26/2021   Arthritis    Bacterial infection    Bipolar 1 disorder (H[facility])    Blister of second toe of left foot 11/21/2016   Candida vaginitis 07/2007   Depression    recently added wellbutrin-has not taken yet for bipolar   Diabetes in pregnancy    Diabetes mellitus    nph 20U qam and qpm, regular with meals   Diabetic ketoacidosis without coma associated with type 2 diabetes mellitus ([place])    Fibroid    Galactorrhea of right breast 2008   GERD (gastroesophageal reflux disease)    H/O amenorrhea 07/2007   H/O dizziness 10/14/2011   H/O dysmenorrhea 2010   H/O menorrhagia 10/14/2011   H/O varicella    Headache(784.0)    Heavy vaginal bleeding due to contraceptive injection use 10/12/2011   Depo Provera   Herpes    HSV-2 infection 01/03/2009   Hx: UTI (urinary tract infection) 2009   Hypertension     on aldomet   Increased BMI 2010   Irregular uterine bleeding 04/04/2012   Pt has mirena    Obesity 10/14/2011   Oligomenorrhea 07/2007   Pelvic pain in female    Presence of permanent cardiac pacemaker    Preterm labor    Trichomonas    Yeast infection     Past Surgical History:  Procedure Laterality Date   ABDOMINAL AORTOGRAM W/LOWER EXTREMITY N/A 06/17/2021   Procedure: ABDOMINAL AORTOGRAM W/LOWER EXTREMITY;  Surgeon: Wellington Hampshire, MD;  Location: [place] CV LAB;  Service: Cardiovascular;  Laterality: N/A;   AMPUTATION Left 06/27/2021   Procedure: AMPUTATION BELOW KNEE;  Surgeon: Newt Minion, MD;  Location: [facility];  Service: Orthopedics;  Laterality: Left;   AMPUTATION Right 09/04/2021   Procedure: RIGHT GREAT TOE AMPUTATION;  Surgeon: Newt Minion, MD;  Location: [facility];  Service: Orthopedics;  Laterality: Right;   CESAREAN SECTION  1991   LEFT HEART CATH AND CORONARY ANGIOGRAPHY N/A 02/10/2020   Procedure: LEFT HEART CATH AND CORONARY ANGIOGRAPHY;  Surgeon: Troy Sine, MD;  Location: [place] CV LAB;  Service: Cardiovascular;  Laterality: N/A;   PACEMAKER IMPLANT N/A 02/13/2020   Procedure: PACEMAKER IMPLANT;  Surgeon: Constance Haw, MD;  Location: [place]  CV LAB;  Service: Cardiovascular;  Laterality: N/A;   TEMPORARY PACEMAKER N/A 02/10/2020   Procedure: TEMPORARY PACEMAKER;  Surgeon: Lennette Bihari, MD;  Location: [facility] INVASIVE CV LAB;  Service: Cardiovascular;  Laterality: N/A;    There were no vitals filed for this visit.   Subjective Assessment - 11/04/21 0852     Subjective Pt reports she is getting better at sock management. She is still putting more weight on R LE. She is still not comfortable with stairs as much. Pain is controlled. No pain currently and pain at night is not as bad either.    Pertinent History L BKA, R big toe amputation    Limitations Standing;Walking;House hold activities    Currently in Pain? No/denies                 Stair training: Use of Rail on R going up: alternating steps: 3 x 4 steps Use of rail on L going up: alternating steps: 3 x 4 steps Pt educated on practicing steps at hotel once a day, 1 flight with one rail and alternating steps. Going up and down ramp; fwd and bwd: 2 x 4x each way Going up and down a curb step: up with L LE and down with R LE: 5x Tandem balance: 6 x 15" R and L intermittent UE use Pt educated on practicing getting in and out of the passenger side front and driver and passenger side in the back seat.                           PT Short Term Goals - 11/04/21 0917       PT SHORT TERM GOAL #1   Title Patient will verbalize proper instructions for sweat management, sock management, residual limb and prosthetic leg care to improve functional independence with prosthetic leg.    Baseline Education initiated (10/07/21)    Time 4    Period Weeks    Status Achieved    Target Date 11/05/21      PT SHORT TERM GOAL #2   Title Patient will be able to go up and down curb step without AD and SBA to improve community access    Baseline not attempted; SBA/CGA with curb    Time 4    Period Weeks    Status Achieved    Target Date 11/05/21      PT SHORT TERM GOAL #3   Title Pt will be able to get up and down from floor with suport surface with supervision only.    Baseline not attempted    Time 4    Period Weeks    Status Revised    Target Date 12/02/21               PT Long Term Goals - 11/04/21 0933       PT LONG TERM GOAL #1   Title Patient will demo 20/30 on FGA to improve functional gait and ambulation    Baseline 10/30 (10/07/21)    Time 8    Period Weeks    Status On-going    Target Date 12/30/21      PT LONG TERM GOAL #2   Title Pt will demo at least 0.19m/s gait speed without AD to improve commnity ambulation    Baseline 0.5 m/s (10/07/21)    Time 8    Period Weeks    Status On-going    Target Date 12/30/21  PT  LONG TERM GOAL #3   Title Pt will be able to maintain SLS for 5 sec on L LE to improve balance with curb steps negotiations    Baseline Revised on 11/04/21    Time 8    Period Weeks    Status New    Target Date 12/30/21                   Plan - 11/04/21 4163     Clinical Impression Statement Patient has been seen for total of 4 sessions from 10/08/21 to 11/04/21. Pt met all the short term goals today and progressing towards long term goals. Pt is still challanged with dynamic balance activities, stairs and curbs transfers where she utilizes UE support due to lack of confidence and balance. patient will continue to benefit from skilled PT.    Personal Factors and Comorbidities Past/Current Experience;Comorbidity 2    Comorbidities L BKA, R big toe amputation    Examination-Activity Limitations Lift;Squat;Stairs;Stand    Examination-Participation Restrictions Cleaning;Community Activity;Occupation;Shop;Yard Work    Stability/Clinical Decision Making Stable/Uncomplicated    Rehab Potential Good    PT Frequency 2x / week    PT Duration 6 weeks    PT Treatment/Interventions ADLs/Self Care Home Management;Cryotherapy;Moist Heat;Biofeedback;Gait training;Stair training;Functional mobility training;Therapeutic activities;Therapeutic exercise;Balance training;Manual techniques;Prosthetic Training;Orthotic Fit/Training;Patient/family education;Neuromuscular re-education;Passive range of motion;Scar mobilization;Energy conservation;Joint Manipulations    PT Next Visit Plan Continue with prosthetic education, sock management, sweat management. Work on Aeronautical engineer with equal stance time/step length and normalized base of support (pt tends to be wide). continue to work on balance with decreased UE support.             Patient will benefit from skilled therapeutic intervention in order to improve the following deficits and impairments:  Abnormal gait, Decreased activity tolerance, Decreased  balance, Decreased coordination, Decreased range of motion, Decreased mobility, Decreased endurance, Decreased skin integrity, Decreased strength, Difficulty walking, Impaired flexibility, Increased edema, Impaired sensation, Postural dysfunction, Pain, Prosthetic Dependency  Visit Diagnosis: Other abnormalities of gait and mobility  Muscle weakness (generalized)  Left below-knee amputee [facility])     Problem List Patient Active Problem List   Diagnosis Date Noted   Type 2 diabetes mellitus with diabetic polyneuropathy, with long-term current use of insulin ([place]) 11/03/2021   Right great toe amputee ([place]) 11/03/2021   Type 2 diabetes mellitus with hyperglycemia, with long-term current use of insulin ([place]) 11/02/2021   Class 2 severe obesity due to excess calories with serious comorbidity and body mass index (BMI) of 36.0 to 36.9 in adult [facility]) 10/15/2021   Hypoglycemia 10/14/2021   Elevated blood pressure reading with diagnosis of hypertension 10/07/2021   Insomnia 10/07/2021   Localized edema 10/07/2021   Right toe amputee ([place])    Left below-knee amputee ([place]) 07/01/2021   Protein-calorie malnutrition ([place]) 06/26/2021   Hypoalbuminemia due to protein-calorie malnutrition ([place]) 06/26/2021   Normocytic anemia 06/26/2021   Heart failure with mid-range ejection fraction ([place]) 06/26/2021   Diabetic nephropathy with proteinuria ([place]) 06/26/2021   Diabetic foot ulcer ([place]) 05/26/2021   Bacterial vaginosis 07/17/2020   Dental caries 04/01/2020   Cracked tooth 04/01/2020   Alcohol use 03/11/2020   Vitamin D deficiency 03/11/2020   Prolonged QT interval 02/26/2020   Chronic combined systolic and diastolic CHF (congestive heart failure) ([place]) 02/26/2020   Heart block AV complete ([place]) 02/11/2020   Exocrine pancreatic insufficiency    Dysmenorrhea 10/02/2019   DM type 2 with diabetic peripheral neuropathy ([place]) 05/30/2019  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  block) 05/29/2019   Bilateral bunions 05/29/2019   Type 2 diabetes mellitus ([place]) 10/21/2016   Smoking 11/26/2013   Hypercholesteremia 02/02/2007   Bipolar 1 disorder ([place]) 02/02/2007   HYPERTENSION, BENIGN SYSTEMIC 02/02/2007    Kerrie Pleasure, PT 11/04/2021, 9:36 AM  [facility] [address] [place], [place], [address] Phone: [phone]   Fax:  [phone]  Name: Sarah Long MRN: [id number] Date of Birth: [birth year]

## 2021-11-06 ENCOUNTER — Telehealth: Payer: Self-pay

## 2021-11-06 ENCOUNTER — Ambulatory Visit: Payer: Medicaid Other | Attending: Critical Care Medicine | Admitting: Physical Therapy

## 2021-11-06 ENCOUNTER — Other Ambulatory Visit: Payer: Self-pay

## 2021-11-06 ENCOUNTER — Encounter: Payer: Self-pay | Admitting: Physical Therapy

## 2021-11-06 DIAGNOSIS — Z89512 Acquired absence of left leg below knee: Secondary | ICD-10-CM | POA: Insufficient documentation

## 2021-11-06 DIAGNOSIS — R2689 Other abnormalities of gait and mobility: Secondary | ICD-10-CM | POA: Insufficient documentation

## 2021-11-06 DIAGNOSIS — M6281 Muscle weakness (generalized): Secondary | ICD-10-CM | POA: Insufficient documentation

## 2021-11-06 MED ORDER — DEXCOM G6 RECEIVER DEVI: 0 refills | Status: DC

## 2021-11-06 NOTE — Telephone Encounter (Signed)
Dr. Kelton Pillar pt:   Patient needing a Dexcom G6 receiver and maybe a new transmitter. Currently she has to prick her finger to check her glucose level.   Please call patient to discuss.   Call back phone# [phone].

## 2021-11-06 NOTE — Therapy (Signed)
[facility] [address] [place], [place], [address] Phone: [phone]   Fax:  [phone]  Physical Therapy Treatment  Patient Details  Name: Sarah Long MRN: [id number] Date of Birth: 1971/06/13 Referring Provider (PT): Dr. Sharol Given   Encounter Date: 11/06/2021   PT End of Session - 11/06/21 1122     Visit Number 6    Number of Visits 21    Date for PT Re-Evaluation 12/30/21    Authorization Type [employer] effective 11/05/21    Authorization Time Period Will need to submit after initial 12 visits    Authorization - Visit Number 1    Authorization - Number of Visits 12    PT Start Time 5638    PT Stop Time 0928    PT Time Calculation (min) 41 min    Equipment Utilized During Treatment Gait belt    Activity Tolerance Patient tolerated treatment well;No increased pain    Behavior During Therapy [facility] for tasks assessed/performed             Past Medical History:  Diagnosis Date   Abnormal Pap smear 1998   Abnormal vaginal bleeding 12/19/2011   Acute urinary retention 06/26/2021   Arthritis    Bacterial infection    Bipolar 1 disorder (HCC)    Blister of second toe of left foot 11/21/2016   Candida vaginitis 07/2007   Depression    recently added wellbutrin-has not taken yet for bipolar   Diabetes in pregnancy    Diabetes mellitus    nph 20U qam and qpm, regular with meals   Diabetic ketoacidosis without coma associated with type 2 diabetes mellitus ([place])    Fibroid    Galactorrhea of right breast 2008   GERD (gastroesophageal reflux disease)    H/O amenorrhea 07/2007   H/O dizziness 10/14/2011   H/O dysmenorrhea 2010   H/O menorrhagia 10/14/2011   H/O varicella    Headache(784.0)    Heavy vaginal bleeding due to contraceptive injection use 10/12/2011   Depo Provera   Herpes    HSV-2 infection 01/03/2009   Hx: UTI (urinary tract infection) 2009   Hypertension    on aldomet   Increased BMI 2010    Irregular uterine bleeding 04/04/2012   Pt has mirena    Obesity 10/14/2011   Oligomenorrhea 07/2007   Pelvic pain in female    Presence of permanent cardiac pacemaker    Preterm labor    Trichomonas    Yeast infection     Past Surgical History:  Procedure Laterality Date   ABDOMINAL AORTOGRAM W/LOWER EXTREMITY N/A 06/17/2021   Procedure: ABDOMINAL AORTOGRAM W/LOWER EXTREMITY;  Surgeon: Wellington Hampshire, MD;  Location: [facility];  Service: Cardiovascular;  Laterality: N/A;   AMPUTATION Left 06/27/2021   Procedure: AMPUTATION BELOW KNEE;  Surgeon: Newt Minion, MD;  Location: [facility];  Service: Orthopedics;  Laterality: Left;   AMPUTATION Right 09/04/2021   Procedure: RIGHT GREAT TOE AMPUTATION;  Surgeon: Newt Minion, MD;  Location: [facility];  Service: Orthopedics;  Laterality: Right;   CESAREAN SECTION  1991   LEFT HEART CATH AND CORONARY ANGIOGRAPHY N/A 02/10/2020   Procedure: LEFT HEART CATH AND CORONARY ANGIOGRAPHY;  Surgeon: Troy Sine, MD;  Location: [facility];  Service: Cardiovascular;  Laterality: N/A;   PACEMAKER IMPLANT N/A 02/13/2020   Procedure: PACEMAKER IMPLANT;  Surgeon: Constance Haw, MD;  Location: [facility];  Service: Cardiovascular;  Laterality: N/A;  TEMPORARY PACEMAKER N/A 02/10/2020   Procedure: TEMPORARY PACEMAKER;  Surgeon: Troy Sine, MD;  Location: [facility];  Service: Cardiovascular;  Laterality: N/A;    There were no vitals filed for this visit.   Subjective Assessment - 11/06/21 0848     Subjective Pt reports she dropped something under the bed the other day, and having to get down and pick it back up. The pt also reports difficulty with toilet transfers with no grab bars. No new falls or pain to report.    Pertinent History L BKA, R big toe amputation    Limitations Standing;Walking;House hold activities    Currently in Pain? No/denies    Pain Score 0-No pain               [facility] Adult PT  Treatment/Exercise - 11/06/21 0850       Transfers   Transfers Sit to Stand;Stand to Sit;Floor to Transfer    Sit to Stand 5: Supervision;With upper extremity assist;From chair/3-in-1    Sit to Stand Details Visual cues/gestures for sequencing;Verbal cues for technique    Sit to Stand Details (indicate cue type and reason) Sit<>stand x 5 using UE's to push off from LE's    Stand to Sit 5: Supervision;With upper extremity assist;To chair/3-in-1    Floor to Transfer 4: Min guard;5: Supervision    Floor to Transfer Details (indicate cue type and reason) Praticed to/from floor transfers x 3. The pt require min cues for technique and and performed min supervision/min guard and 1 LOB when comming into standing.    Floor to Transfer Details Visual cues/gestures for sequencing;Verbal cues for sequencing;Verbal cues for technique      Ambulation/Gait   Ambulation/Gait Yes    Ambulation/Gait Assistance 5: Supervision    Ambulation Distance (Feet) --   throughout session   Assistive device None;Prosthesis    Gait Pattern Step-through pattern;Decreased stride length;Decreased stance time - left;Decreased step length - right;Decreased weight shift to left;Decreased trunk rotation;Wide base of support    Ambulation Surface Level      Knee/Hip Exercises: Stretches   Gastroc Stretch 1 rep;20 seconds    Gastroc Stretch Limitations Standing gastroc stretch on R x 30 sec.      Knee/Hip Exercises: Seated   Ball Squeeze x 10 with 5 sec hold      Prosthetics   Current prosthetic wear tolerance (days/week)  daily    Current prosthetic wear tolerance (#hours/day)  8-12 hours with breaks as needed.    Residual limb condition  Dry and intact, with both distal wounds healed.    Education Provided Skin check;Residual limb care;Other (comment)    Person(s) Educated Patient    Education Method Explanation    Education Method Verbalized understanding    Donning Prosthesis Modified independent (device/increased  time)               Balance Exercises - 11/06/21 0918       Balance Exercises: Standing   Tandem Gait Forward;Upper extremity support;Limitations;3 reps    Tandem Gait Limitations Pt performed tandem gait at counter x 3 down and back with single UE support. Pt required min guard and cues for technique, demonstrating one LOB.    Step Over Hurdles / Cones Pt positioned at counter: Pt performed stepping over black foam bolsters x 3 down and back. The pt demonstrated a step-to pattern while leading with R LE, requiring min guard and no UE support. The pt then transitioned to stepping over short  hurdles. The pt attempted to perform with alternating pattern with no UE support, however demonstrated several episodes of LOB and required UE support/min assist to corrct. The pt performed with step-to pattern leading with R LE then the L LE x 2 each down/back. The pt and was able to perform with min single UE support and no LOB. When leading with the L LE, the pt demonstrated circumduction on the L while stepping over hurdle. The pt then performed stepping over foam bolsters with step-to pattern leading with L LE x 2 down and back. The pt demonstrated decreased cicumduction when stepping over blosters. The pt required min guard for stepping over all other obsticles.               PT Short Term Goals - 11/04/21 0917       PT SHORT TERM GOAL #1   Title Patient will verbalize proper instructions for sweat management, sock management, residual limb and prosthetic leg care to improve functional independence with prosthetic leg.    Baseline Education initiated (10/07/21)    Time 4    Period Weeks    Status Achieved    Target Date 11/05/21      PT SHORT TERM GOAL #2   Title Patient will be able to go up and down curb step without AD and SBA to improve community access    Baseline not attempted; SBA/CGA with curb    Time 4    Period Weeks    Status Achieved    Target Date 11/05/21      PT SHORT  TERM GOAL #3   Title Pt will be able to get up and down from floor with suport surface with supervision only.    Baseline not attempted    Time 4    Period Weeks    Status Revised    Target Date 12/02/21               PT Long Term Goals - 11/04/21 0933       PT LONG TERM GOAL #1   Title Patient will demo 20/30 on FGA to improve functional gait and ambulation    Baseline 10/30 (10/07/21)    Time 8    Period Weeks    Status On-going    Target Date 12/30/21      PT LONG TERM GOAL #2   Title Pt will demo at least 0.63m/s gait speed without AD to improve commnity ambulation    Baseline 0.5 m/s (10/07/21)    Time 8    Period Weeks    Status On-going    Target Date 12/30/21      PT LONG TERM GOAL #3   Title Pt will be able to maintain SLS for 5 sec on L LE to improve balance with curb steps negotiations    Baseline Revised on 11/04/21    Time 8    Period Weeks    Status New    Target Date 12/30/21               Plan - 11/06/21 1125     Clinical Impression Statement Today's skilled session was focused on practicing floor transfers and performing sit<>stands while pushing off from LE's. The rest of the session was focused on dynamic balance and LE strengthening/stretching activities. The pt performed all interventions with no issues noted. The pt should continue to benefit from further skilled PT to address remaining functional deficits.    Personal Factors and Comorbidities Past/Current  Experience;Comorbidity 2    Comorbidities L BKA, R big toe amputation    Examination-Activity Limitations Lift;Squat;Stairs;Stand    Examination-Participation Restrictions Cleaning;Community Activity;Occupation;Shop;Yard Work    Stability/Clinical Decision Making Stable/Uncomplicated    Rehab Potential Good    PT Frequency 2x / week    PT Duration 6 weeks    PT Treatment/Interventions ADLs/Self Care Home Management;Cryotherapy;Moist Heat;Biofeedback;Gait training;Stair  training;Functional mobility training;Therapeutic activities;Therapeutic exercise;Balance training;Manual techniques;Prosthetic Training;Orthotic Fit/Training;Patient/family education;Neuromuscular re-education;Passive range of motion;Scar mobilization;Energy conservation;Joint Manipulations    PT Next Visit Plan Continue with prosthetic education, sock management, sweat management. Work on Aeronautical engineer with equal stance time/step length and normalized base of support (pt tends to be wide). continue to work on balance (static/dynamic) with decreased UE support, LE strengthening. Practice sit<>stands from low surfaces    Consulted and Agree with Plan of Care Patient             Patient will benefit from skilled therapeutic intervention in order to improve the following deficits and impairments:  Abnormal gait, Decreased activity tolerance, Decreased balance, Decreased coordination, Decreased range of motion, Decreased mobility, Decreased endurance, Decreased skin integrity, Decreased strength, Difficulty walking, Impaired flexibility, Increased edema, Impaired sensation, Postural dysfunction, Pain, Prosthetic Dependency  Visit Diagnosis: Other abnormalities of gait and mobility  Muscle weakness (generalized)     Problem List Patient Active Problem List   Diagnosis Date Noted   Type 2 diabetes mellitus with diabetic polyneuropathy, with long-term current use of insulin ([place]) 11/03/2021   Right great toe amputee ([place]) 11/03/2021   Type 2 diabetes mellitus with hyperglycemia, with long-term current use of insulin ([place]) 11/02/2021   Class 2 severe obesity due to excess calories with serious comorbidity and body mass index (BMI) of 36.0 to 36.9 in adult ([place]) 10/15/2021   Hypoglycemia 10/14/2021   Elevated blood pressure reading with diagnosis of hypertension 10/07/2021   Insomnia 10/07/2021   Localized edema 10/07/2021   Right toe amputee ([place])    Left below-knee amputee ([place]) 07/01/2021    Protein-calorie malnutrition ([place]) 06/26/2021   Hypoalbuminemia due to protein-calorie malnutrition ([place]) 06/26/2021   Normocytic anemia 06/26/2021   Heart failure with mid-range ejection fraction ([place]) 06/26/2021   Diabetic nephropathy with proteinuria ([place]) 06/26/2021   Diabetic foot ulcer ([place]) 05/26/2021   Bacterial vaginosis 07/17/2020   Dental caries 04/01/2020   Cracked tooth 04/01/2020   Alcohol use 03/11/2020   Vitamin D deficiency 03/11/2020   Prolonged QT interval 02/26/2020   Chronic combined systolic and diastolic CHF (congestive heart failure) ([place]) 02/26/2020   Heart block AV complete ([place]) 02/11/2020   Exocrine pancreatic insufficiency    Dysmenorrhea 10/02/2019   DM type 2 with diabetic peripheral neuropathy ([place]) 05/30/2019   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 block) 05/29/2019   Bilateral bunions 05/29/2019   Type 2 diabetes mellitus ([place]) 10/21/2016   Smoking 11/26/2013   Hypercholesteremia 02/02/2007   Bipolar 1 disorder ([place]) 02/02/2007   HYPERTENSION, BENIGN SYSTEMIC 02/02/2007    Rondel Baton, SPTA 11/06/2021, 11:38 AM  [facility] [address] [place], [place], [address] Phone: [phone]   Fax:  [phone]  Name: Sarah Long MRN: [id number] Date of Birth: June 06, 1971

## 2021-11-06 NOTE — Telephone Encounter (Signed)
Receiver sent

## 2021-11-09 ENCOUNTER — Other Ambulatory Visit (HOSPITAL_COMMUNITY): Payer: Self-pay

## 2021-11-09 ENCOUNTER — Telehealth: Payer: Self-pay | Admitting: Internal Medicine

## 2021-11-09 NOTE — Telephone Encounter (Signed)
Pt called needing a PA on the following to be sent to the pharmacy.  Continuous Blood Gluc Receiver (DEXCOM G6 RECEIVER) DEVI Continuous Blood Gluc Sensor (DEXCOM G6 SENSOR) MISC Continuous Blood Gluc Transmit (DEXCOM G6 TRANSMITTER) MISC  [facility] [phone] - [place], [place] - [phone] AT [place] Phone:  [phone]  Fax:  [phone]

## 2021-11-09 NOTE — Telephone Encounter (Signed)
Patient needs a PA for [facility] receiver

## 2021-11-10 ENCOUNTER — Encounter: Payer: Self-pay | Admitting: Orthopedic Surgery

## 2021-11-10 ENCOUNTER — Other Ambulatory Visit (HOSPITAL_COMMUNITY): Payer: Self-pay

## 2021-11-10 ENCOUNTER — Telehealth: Payer: Self-pay

## 2021-11-10 ENCOUNTER — Encounter
Payer: Medicaid Other | Attending: Physical Medicine and Rehabilitation | Admitting: Physical Medicine and Rehabilitation

## 2021-11-10 ENCOUNTER — Other Ambulatory Visit: Payer: Self-pay

## 2021-11-10 ENCOUNTER — Encounter: Payer: Self-pay | Admitting: Physical Medicine and Rehabilitation

## 2021-11-10 VITALS — BP 155/105 | HR 83 | Temp 98.0°F | Ht 64.0 in | Wt 210.4 lb

## 2021-11-10 DIAGNOSIS — D649 Anemia, unspecified: Secondary | ICD-10-CM | POA: Diagnosis not present

## 2021-11-10 DIAGNOSIS — E1142 Type 2 diabetes mellitus with diabetic polyneuropathy: Secondary | ICD-10-CM | POA: Insufficient documentation

## 2021-11-10 DIAGNOSIS — K5900 Constipation, unspecified: Secondary | ICD-10-CM | POA: Diagnosis not present

## 2021-11-10 DIAGNOSIS — Z89512 Acquired absence of left leg below knee: Secondary | ICD-10-CM

## 2021-11-10 NOTE — Progress Notes (Signed)
Subjective:    Patient ID: Sarah Long, female    DOB: [birth year], 50 y.o.   MRN: [id number]  HPI Sarah Long is a 50 year old woman who presents for follow-up of left BKA.   1) Type 2 DM -she has an appointment with an endocrinologist -she has tingling and numbness in her right foot -she is getting phantom limb pain in left leg.  -she has follow-up with Dr. Sharol Given  and PCP -she is willing to try nerve medications -she has neuropathic pain throughout her right lower extremity -does not require any refills today  2) Constipation: -she does not lie the Miralax -she is worried about being constipated wit the iron.   3) Left BKA -she has severe phantom limb pain -no pain in the residual limb -tolerating prosthesis well  4) Right lower extremity edema -compression garments ordered by Dr. Sharol Given -she does not like how much the lasix makes her urinate -does not require any refills today  5) Menarche -returned  Pain Inventory Average Pain 6 Pain Right Now 7 My pain is stabbing and aching  In the last 24 hours, has pain interfered with the following? General activity 6 Relation with others 6 Enjoyment of life 7 What TIME of day is your pain at its worst? evening Sleep (in general) Poor  Pain is worse with: inactivity Pain improves with: rest and therapy/exercise Relief from Meds: 6  use a walker how many minutes can you walk? 5 ability to climb steps?  no  disabled: date disabled 06/27/2021  numbness tingling spasms depression anxiety  N/a  N/a    Family History  Problem Relation Age of Onset   Hypertension Mother    Diabetes Mother    Heart disease Mother    Hypertension Father    Diabetes Father    Heart disease Father    Stroke Maternal Grandfather    Other Neg Hx    Social History   Socioeconomic History   Marital status: Significant Other    Spouse name: Not on file   Number of children: Not on file   Years of education: Not on file    Highest education level: Not on file  Occupational History   Not on file  Tobacco Use   Smoking status: Former    Packs/day: 0.50    Years: 20.00    Pack years: 10.00    Types: Cigarettes    Quit date: 07/01/2021    Years since quitting: 0.3   Smokeless tobacco: Never  Vaping Use   Vaping Use: Never used  Substance and Sexual Activity   Alcohol use: No   Drug use: No   Sexual activity: Yes    Birth control/protection: None  Other Topics Concern   Not on file  Social History Narrative   Not on file   Social Determinants of Health   Financial Resource Strain: Not on file  Food Insecurity: Not on file  Transportation Needs: Not on file  Physical Activity: Not on file  Stress: Not on file  Social Connections: Not on file   Past Surgical History:  Procedure Laterality Date   ABDOMINAL AORTOGRAM W/LOWER EXTREMITY N/A 06/17/2021   Procedure: ABDOMINAL AORTOGRAM W/LOWER EXTREMITY;  Surgeon: Wellington Hampshire, MD;  Location: [place] [facility];  Service: Cardiovascular;  Laterality: N/A;   AMPUTATION Left 06/27/2021   Procedure: AMPUTATION BELOW KNEE;  Surgeon: Newt Minion, MD;  Location: [facility];  Service: Orthopedics;  Laterality: Left;   AMPUTATION  Right 09/04/2021   Procedure: RIGHT GREAT TOE AMPUTATION;  Surgeon: Newt Minion, MD;  Location: [facility];  Service: Orthopedics;  Laterality: Right;   CESAREAN SECTION  1991   LEFT HEART CATH AND CORONARY ANGIOGRAPHY N/A 02/10/2020   Procedure: LEFT HEART CATH AND CORONARY ANGIOGRAPHY;  Surgeon: Troy Sine, MD;  Location: [facility];  Service: Cardiovascular;  Laterality: N/A;   PACEMAKER IMPLANT N/A 02/13/2020   Procedure: PACEMAKER IMPLANT;  Surgeon: Constance Haw, MD;  Location: [facility];  Service: Cardiovascular;  Laterality: N/A;   TEMPORARY PACEMAKER N/A 02/10/2020   Procedure: TEMPORARY PACEMAKER;  Surgeon: Troy Sine, MD;  Location: [facility];  Service: Cardiovascular;  Laterality: N/A;    Past Medical History:  Diagnosis Date   Abnormal Pap smear 1998   Abnormal vaginal bleeding 12/19/2011   Acute urinary retention 06/26/2021   Arthritis    Bacterial infection    Bipolar 1 disorder (HCC)    Blister of second toe of left foot 11/21/2016   Candida vaginitis 07/2007   Depression    recently added wellbutrin-has not taken yet for bipolar   Diabetes in pregnancy    Diabetes mellitus    nph 20U qam and qpm, regular with meals   Diabetic ketoacidosis without coma associated with type 2 diabetes mellitus ([place])    Fibroid    Galactorrhea of right breast 2008   GERD (gastroesophageal reflux disease)    H/O amenorrhea 07/2007   H/O dizziness 10/14/2011   H/O dysmenorrhea 2010   H/O menorrhagia 10/14/2011   H/O varicella    Headache(784.0)    Heavy vaginal bleeding due to contraceptive injection use 10/12/2011   Depo Provera   Herpes    HSV-2 infection 01/03/2009   Hx: UTI (urinary tract infection) 2009   Hypertension    on aldomet   Increased BMI 2010   Irregular uterine bleeding 04/04/2012   Pt has mirena    Obesity 10/14/2011   Oligomenorrhea 07/2007   Pelvic pain in female    Presence of permanent cardiac pacemaker    Preterm labor    Trichomonas    Yeast infection    There were no vitals taken for this visit.  Opioid Risk Score:   Fall Risk Score:  `1  Depression screen PHQ 2/9  Depression screen [facility] 2/9 10/07/2021 08/05/2021 07/30/2021 06/18/2021 09/09/2020 07/16/2020 07/29/2019  Decreased Interest 0 1 1 2  0 1 0  Down, Depressed, Hopeless 1 2 1 2 2 2  0  PHQ - 2 Score 1 3 2 4 2 3  0  Altered sleeping 1 2 3 3 2 3 1   Tired, decreased energy 2 3 0 2 2 1 1   Change in appetite 2 0 0 2 0 3 0  Feeling bad or failure about yourself  0 0 0 1 0 2 0  Trouble concentrating 0 0 1 0 0 0 0  Moving slowly or fidgety/restless 0 0 0 1 0 0 0  Suicidal thoughts 0 0 0 0 0 0 0  PHQ-9 Score 6 8 6 13 6 12 2   Difficult doing work/chores - - Not difficult at all - Not  difficult at all - -  Some recent data might be hidden      Review of Systems  Constitutional: Negative.   HENT: Negative.    Eyes: Negative.   Respiratory: Negative.    Cardiovascular: Negative.   Gastrointestinal: Negative.   Endocrine: Negative.   Genitourinary: Negative.  Musculoskeletal:        Pain in left leg  Skin: Negative.   Allergic/Immunologic: Negative.   Neurological:  Positive for weakness and numbness.  Hematological: Negative.   Psychiatric/Behavioral:  Positive for dysphoric mood. The patient is nervous/anxious.       Objective:   Physical Exam Gen: no distress, normal appearing HEENT: oral mucosa pink and moist, NCAT Cardio: Reg rate Chest: normal effort, normal rate of breathing Abd: soft, non-distended Ext: no edema Psych: pleasant, normal affect Skin: intact Neuro: Alert and oriented x3 Musculoskeletal: Left BKA healed well, ambulating with prosthetic well. Amputated digits of right foot    Assessment & Plan:  Mrs. Stenglein is a 50 year old woman who presents for hospital follow-up after CIR admission for left BKA  1) L BKA with phantom limb pain -continue follow-up with Dr. Sharol Given, healing well -increase gabapentin 300mg  TID -continue tapping and rubbing  -Discussed Qutenza as an option for neuropathic pain control. Discussed that this is a capsaicin patch, stronger than capsaicin cream. Discussed that it is currently approved for diabetic peripheral neuropathy and post-herpetic neuralgia, but that it has also shown benefit in treating other forms of neuropathy. Provided patient with link to site to learn more about the patch: [employer]. Discussed that the patch would be placed in office and benefits usually last 3 months. Discussed that unintended exposure to capsaicin can cause severe irritation of eyes, mucous membranes, respiratory tract, and skin, but that Qutenza is a local treatment and does not have the systemic side effects of  other nerve medications. Discussed that there may be pain, itching, erythema, and decreased sensory function associated with the application of Qutenza. Side effects usually subside within 1 week. A cold pack of analgesic medications can help with these side effects. Blood pressure can also be increased due to pain associated with administration of the patch.  1 patch of Qutenza was applied to the area of pain. Ice packs were applied during the procedure to ensure patient comfort. Blood pressure was monitored every 15 minutes. The patient tolerated the procedure well. Post-procedure instructions were given and follow-up has been scheduled.    2) Type 2 DM with peripheral neuropathy -Discussed Qutenza as an option for neuropathic pain control. Discussed that this is a capsaicin patch, stronger than capsaicin cream. Discussed that it is currently approved for diabetic peripheral neuropathy and post-herpetic neuralgia, but that it has also shown benefit in treating other forms of neuropathy. Provided patient with link to site to learn more about the patch: [employer]. Discussed that the patch would be placed in office and benefits usually last 3 months. Discussed that unintended exposure to capsaicin can cause severe irritation of eyes, mucous membranes, respiratory tract, and skin, but that Qutenza is a local treatment and does not have the systemic side effects of other nerve medications. Discussed that there may be pain, itching, erythema, and decreased sensory function associated with the application of Qutenza. Side effects usually subside within 1 week. A cold pack of analgesic medications can help with these side effects. Blood pressure can also be increased due to pain associated with administration of the patch.   3 patches of Qutenza was applied to the area of pain. Ice packs were applied during the procedure to ensure patient comfort. Blood pressure was monitored every 15 minutes. The  patient tolerated the procedure well. Post-procedure instructions were given and follow-up has been scheduled.     Recommended 1 glass water with 1 TB apple cider vinegar  before meals to reduce CBG spike, has additional health benefits, drink with straw to protect enamel.    2) Anemia -discussed what it means to be anemic -discussed sources of iron rih fods.   3)Constipation:  -Provided list of following foods that help with constipation and highlighted a few: 1) prunes- contain high amounts of fiber.  2) apples- has a form of dietary fiber called pectin that accelerates stool movement and increases beneficial gut bacteria 3) pears- in addition to fiber, also high in fructose and sorbitol which have laxative effect 4) figs- contain an enzyme ficin which helps to speed colonic transit 5) kiwis- contain an enzyme actinidin that improves gut motility and reduces constipation 6) oranges- rich in pectin (like apples) 7) grapefruits- contain a flavanol naringenin which has a laxative effect 8) vegetables- rich in fiber and also great sources of folate, vitamin C, and K 9) artichoke- high in inulin, prebiotic great for the microbiome 10) chicory- increases stool frequency and softness (can be added to coffee) 11) rhubarb- laxative effect 12) sweet potato- high fiber 13) beans, peas, and lentils- contain both soluble and insoluble fiber 14) chia seeds- improves intestinal health and gut flora 15) flaxseeds- laxative effect 16) whole grain rye bread- high in fiber 17) oat bran- high in soluble and insoluble fiber 18) kefir- softens stools -recommended to try at least one of these foods every day.  -drink 6-8 glasses of water per day -walk regularly, especially after meals.

## 2021-11-10 NOTE — Telephone Encounter (Signed)
PA for [facility] Receiver was denied, however, the reasoning was because clinical criteria questions weren't answered. It appears Cover My Meds didn't fully review the pt's case and the ins wanted more info. Called [phone] to answer the questions and submit PA requests for the transmitter and sensors as well.  Pending PA # for Receiver [phone]                             Transmitter [phone]                              Sensors   [phone]

## 2021-11-10 NOTE — Progress Notes (Signed)
Office Visit Note   [name]           Date of Birth: 1971-10-09           MRN: [id number] Visit Date: 10/26/2021              Requested by: [name], MD [address],  [place] [address] PCP: [name], MD  Chief Complaint  Patient presents with   Right Foot - Routine Post Op    Right GT amputation 09/04/21 S/p L BKA 06/27/21      HPI: Patient is a 50 year old woman who is 2 months status post right great toe amputation and 4 months status post left below-knee amputation.  Patient states she is currently on Lasix for edema and is scheduled for an echocardiogram.  Assessment & Plan: Visit Diagnoses:  1. Left below-knee amputee ([place])   2. Right great toe amputee ([place])     Plan: Discussed risk of nonsteroidals and Lasix on the kidney.  Recommended compression stockings.  Recommended stretching for the equinus contracture.  Continue with the shrinker under the liner for the left below-knee amputation.  Follow-Up Instructions: Return in about 3 months (around 01/26/2022).   Ortho Exam  Patient is alert, oriented, no adenopathy, well-dressed, normal affect, normal respiratory effort. Examination patient does have Achilles tightness on the right stretching was demonstrated.  The transtibial amputation on the left is well-healed.  She does have venous stasis swelling of the right lower extremity her calf is 41 cm in circumference.  Imaging: No results found. No images are attached to the encounter.  Labs: Lab Results  Component Value Date   HGBA1C 7.4 (A) 10/14/2021   HGBA1C 13.2 (H) 07/02/2021   HGBA1C 13.6 (A) 06/18/2021   LABURIC 4.1 10/21/2016   LABURIC 4.2 11/20/2010   REPTSTATUS 08/24/2021 FINAL 08/19/2021   GRAMSTAIN NO WBC SEEN NO ORGANISMS SEEN  08/19/2021   CULT  08/19/2021    RARE PSEUDOMONAS AERUGINOSA WITHIN MIXED ORGANISMS NO ANAEROBES ISOLATED Performed at [facility], [place] [address], [place], [place]  [address]    LABORGA PSEUDOMONAS AERUGINOSA 08/19/2021     Lab Results  Component Value Date   ALBUMIN 3.8 10/07/2021   ALBUMIN 3.4 (L) 07/30/2021   ALBUMIN 2.2 (L) 07/13/2021    Lab Results  Component Value Date   MG 1.7 06/28/2021   MG 1.8 06/27/2021   MG 1.7 06/27/2021   Lab Results  Component Value Date   VD25OH 24.1 (L) 03/11/2020   VD25OH <4.20 (L) 02/15/2020    No results found for: PREALBUMIN CBC EXTENDED Latest Ref Rng & Units 09/04/2021 07/30/2021 07/06/2021  WBC 4.0 - 10.5 K/uL 7.5 7.9 10.0  RBC 3.87 - 5.11 MIL/uL 3.69(L) 3.11(L) 2.95(L)  HGB 12.0 - 15.0 g/dL 9.9(L) 8.3(L) 8.1(L)  HCT 36.0 - 46.0 % 31.7(L) 26.4(L) 26.6(L)  PLT 150 - 400 K/uL 224 342 332  NEUTROABS 1.4 - 7.0 x10E3/uL - 5.8 -  LYMPHSABS 0.7 - 3.1 x10E3/uL - 1.6 -     There is no height or weight on file to calculate BMI.  Orders:  No orders of the defined types were placed in this encounter.  No orders of the defined types were placed in this encounter.    Procedures: No procedures performed  Clinical Data: No additional findings.  ROS:  All other systems negative, except as noted in the HPI. Review of Systems  Objective: Vital Signs: There were no vitals  taken for this visit.  Specialty Comments:  No specialty comments available.  PMFS History: Patient Active Problem List   Diagnosis Date Noted   Type 2 diabetes mellitus with diabetic polyneuropathy, with long-term current use of insulin ([place]) 11/03/2021   Right great toe amputee ([employer]) 11/03/2021   Type 2 diabetes mellitus with hyperglycemia, with long-term current use of insulin ([place]) 11/02/2021   Class 2 severe obesity due to excess calories with serious comorbidity and body mass index (BMI) of 36.0 to 36.9 in adult [facility]) 10/15/2021   Hypoglycemia 10/14/2021   Elevated blood pressure reading with diagnosis of hypertension 10/07/2021   Insomnia 10/07/2021   Localized edema 10/07/2021   Right toe amputee ([place])    Left  below-knee amputee ([place]) 07/01/2021   Protein-calorie malnutrition ([place]) 06/26/2021   Hypoalbuminemia due to protein-calorie malnutrition ([place]) 06/26/2021   Normocytic anemia 06/26/2021   Heart failure with mid-range ejection fraction ([place]) 06/26/2021   Diabetic nephropathy with proteinuria ([place]) 06/26/2021   Diabetic foot ulcer ([place]) 05/26/2021   Bacterial vaginosis 07/17/2020   Dental caries 04/01/2020   Cracked tooth 04/01/2020   Alcohol use 03/11/2020   Vitamin D deficiency 03/11/2020   Prolonged QT interval 02/26/2020   Chronic combined systolic and diastolic CHF (congestive heart failure) ([place]) 02/26/2020   Heart block AV complete ([place]) 02/11/2020   Exocrine pancreatic insufficiency    Dysmenorrhea 10/02/2019   DM type 2 with diabetic peripheral neuropathy ([place]) 05/30/2019   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 block) 05/29/2019   Bilateral bunions 05/29/2019   Type 2 diabetes mellitus ([place]) 10/21/2016   Smoking 11/26/2013   Hypercholesteremia 02/02/2007   Bipolar 1 disorder ([place]) 02/02/2007   HYPERTENSION, BENIGN SYSTEMIC 02/02/2007   Past Medical History:  Diagnosis Date   Abnormal Pap smear 1998   Abnormal vaginal bleeding 12/19/2011   Acute urinary retention 06/26/2021   Arthritis    Bacterial infection    Bipolar 1 disorder ([place])    Blister of second toe of left foot 11/21/2016   Candida vaginitis 07/2007   Depression    recently added wellbutrin-has not taken yet for bipolar   Diabetes in pregnancy    Diabetes mellitus    nph 20U qam and qpm, regular with meals   Diabetic ketoacidosis without coma associated with type 2 diabetes mellitus ([place])    Fibroid    Galactorrhea of right breast 2008   GERD (gastroesophageal reflux disease)    H/O amenorrhea 07/2007   H/O dizziness 10/14/2011   H/O dysmenorrhea 2010   H/O menorrhagia 10/14/2011   H/O varicella    Headache(784.0)    Heavy vaginal bleeding due to contraceptive injection use  10/12/2011   Depo Provera   Herpes    HSV-2 infection 01/03/2009   Hx: UTI (urinary tract infection) 2009   Hypertension    on aldomet   Increased BMI 2010   Irregular uterine bleeding 04/04/2012   Pt has mirena    Obesity 10/14/2011   Oligomenorrhea 07/2007   Pelvic pain in female    Presence of permanent cardiac pacemaker    Preterm labor    Trichomonas    Yeast infection     Family History  Problem Relation Age of Onset   Hypertension Mother    Diabetes Mother    Heart disease Mother    Hypertension Father    Diabetes Father    Heart disease Father    Stroke Maternal Grandfather    Other Neg Hx  Past Surgical History:  Procedure Laterality Date   ABDOMINAL AORTOGRAM W/LOWER EXTREMITY N/A 06/17/2021   Procedure: ABDOMINAL AORTOGRAM W/LOWER EXTREMITY;  Surgeon: Wellington Hampshire, MD;  Location: [facility];  Service: Cardiovascular;  Laterality: N/A;   AMPUTATION Left 06/27/2021   Procedure: AMPUTATION BELOW KNEE;  Surgeon: Newt Minion, MD;  Location: [facility];  Service: Orthopedics;  Laterality: Left;   AMPUTATION Right 09/04/2021   Procedure: RIGHT GREAT TOE AMPUTATION;  Surgeon: Newt Minion, MD;  Location: [facility];  Service: Orthopedics;  Laterality: Right;   CESAREAN SECTION  1991   LEFT HEART CATH AND CORONARY ANGIOGRAPHY N/A 02/10/2020   Procedure: LEFT HEART CATH AND CORONARY ANGIOGRAPHY;  Surgeon: Troy Sine, MD;  Location: [facility] [facility];  Service: Cardiovascular;  Laterality: N/A;   PACEMAKER IMPLANT N/A 02/13/2020   Procedure: PACEMAKER IMPLANT;  Surgeon: Constance Haw, MD;  Location: [place] [facility];  Service: Cardiovascular;  Laterality: N/A;   TEMPORARY PACEMAKER N/A 02/10/2020   Procedure: TEMPORARY PACEMAKER;  Surgeon: Troy Sine, MD;  Location: [place] [facility];  Service: Cardiovascular;  Laterality: N/A;   Social History   Occupational History   Not on file  Tobacco Use   Smoking status: Former    Packs/day: 0.50     Years: 20.00    Pack years: 10.00    Types: Cigarettes    Quit date: 07/01/2021    Years since quitting: 0.3   Smokeless tobacco: Never  Vaping Use   Vaping Use: Never used  Substance and Sexual Activity   Alcohol use: No   Drug use: No   Sexual activity: Yes    Birth control/protection: None

## 2021-11-10 NOTE — Telephone Encounter (Signed)
Patient Advocate Encounter   Received notification from [facility] that prior authorization for Dexcom G6 Receiver is required by his/her insurance [employer]    PA submitted on 11/10/2021  Key#: [id number]  Status is pending    [facility] will continue to follow:  Patient Advocate Fax:  [phone]

## 2021-11-11 ENCOUNTER — Ambulatory Visit: Payer: Medicaid Other

## 2021-11-11 ENCOUNTER — Other Ambulatory Visit (HOSPITAL_COMMUNITY): Payer: Self-pay

## 2021-11-11 DIAGNOSIS — M6281 Muscle weakness (generalized): Secondary | ICD-10-CM

## 2021-11-11 DIAGNOSIS — Z89512 Acquired absence of left leg below knee: Secondary | ICD-10-CM

## 2021-11-11 DIAGNOSIS — R2689 Other abnormalities of gait and mobility: Secondary | ICD-10-CM

## 2021-11-11 NOTE — Telephone Encounter (Signed)
Patient Advocate Encounter  Prior Authorization for [employer] has been approved.    PA# N/A  Effective dates: 11/10/2021 through 05/11/2022  Per Test Claim Patients co-pay is RTS   Spoke with Pharmacy to Process.  Patient Advocate Fax:  [phone]

## 2021-11-11 NOTE — Therapy (Signed)
[facility] [address] [place], [place], [address] Phone: [phone]   Fax:  [phone]  Physical Therapy Treatment  Patient Details  Name: Sarah Long MRN: [id number] Date of Birth: May 20, 1971 Referring Provider (PT): Dr. Sharol Given   Encounter Date: 11/11/2021   PT End of Session - 11/11/21 0850     Visit Number 7    Number of Visits 21    Date for PT Re-Evaluation 12/30/21    Authorization Type [employer] effective 11/05/21    Authorization Time Period Will need to submit after initial 12 visits    Authorization - Visit Number 1    Authorization - Number of Visits 12    Equipment Utilized During Treatment Gait belt    Activity Tolerance Patient tolerated treatment well;No increased pain    Behavior During Therapy [facility] for tasks assessed/performed             Past Medical History:  Diagnosis Date   Abnormal Pap smear 1998   Abnormal vaginal bleeding 12/19/2011   Acute urinary retention 06/26/2021   Arthritis    Bacterial infection    Bipolar 1 disorder (HCC)    Blister of second toe of left foot 11/21/2016   Candida vaginitis 07/2007   Depression    recently added wellbutrin-has not taken yet for bipolar   Diabetes in pregnancy    Diabetes mellitus    nph 20U qam and qpm, regular with meals   Diabetic ketoacidosis without coma associated with type 2 diabetes mellitus ([place])    Fibroid    Galactorrhea of right breast 2008   GERD (gastroesophageal reflux disease)    H/O amenorrhea 07/2007   H/O dizziness 10/14/2011   H/O dysmenorrhea 2010   H/O menorrhagia 10/14/2011   H/O varicella    Headache(784.0)    Heavy vaginal bleeding due to contraceptive injection use 10/12/2011   Depo Provera   Herpes    HSV-2 infection 01/03/2009   Hx: UTI (urinary tract infection) 2009   Hypertension    on aldomet   Increased BMI 2010   Irregular uterine bleeding 04/04/2012   Pt has mirena    Obesity 10/14/2011    Oligomenorrhea 07/2007   Pelvic pain in female    Presence of permanent cardiac pacemaker    Preterm labor    Trichomonas    Yeast infection     Past Surgical History:  Procedure Laterality Date   ABDOMINAL AORTOGRAM W/LOWER EXTREMITY N/A 06/17/2021   Procedure: ABDOMINAL AORTOGRAM W/LOWER EXTREMITY;  Surgeon: Wellington Hampshire, MD;  Location: [facility];  Service: Cardiovascular;  Laterality: N/A;   AMPUTATION Left 06/27/2021   Procedure: AMPUTATION BELOW KNEE;  Surgeon: Newt Minion, MD;  Location: [facility];  Service: Orthopedics;  Laterality: Left;   AMPUTATION Right 09/04/2021   Procedure: RIGHT GREAT TOE AMPUTATION;  Surgeon: Newt Minion, MD;  Location: [facility];  Service: Orthopedics;  Laterality: Right;   CESAREAN SECTION  1991   LEFT HEART CATH AND CORONARY ANGIOGRAPHY N/A 02/10/2020   Procedure: LEFT HEART CATH AND CORONARY ANGIOGRAPHY;  Surgeon: Troy Sine, MD;  Location: [facility];  Service: Cardiovascular;  Laterality: N/A;   PACEMAKER IMPLANT N/A 02/13/2020   Procedure: PACEMAKER IMPLANT;  Surgeon: Constance Haw, MD;  Location: [facility];  Service: Cardiovascular;  Laterality: N/A;   TEMPORARY PACEMAKER N/A 02/10/2020   Procedure: TEMPORARY PACEMAKER;  Surgeon: Troy Sine, MD;  Location: [facility];  Service: Cardiovascular;  Laterality: N/A;    There were no vitals filed for this visit.   Subjective Assessment - 11/11/21 0851     Subjective Pt reports her neurologist put on Qutenza patch on her bil legs yesterday and gave her ointment to manage neuropathic pain.    Currently in Pain? Yes    Pain Score 8     Pain Location Leg    Pain Orientation Left    Pain Descriptors / Indicators Throbbing;Burning    Pain Type Neuropathic pain                 Neuro Desensitization: With towel on residual leg to decrease sensitivity: 15' done by PT  Stair training: SBA, 1 rail assist, practiced reciprocal gait pattern, cues  needed to have ball of foot at edge of the step to improve rocking in the foot to take the stress off the knee: 5 x 5 steps with rail on R going up  Gastroc stretch: off edge of step: 3 x 15" R only  Step ladder:  - Reciprocal pattern: 4 laps - Lateral stepping: 2 laps each way - in in out out: moving fwd: 2 laps                            PT Short Term Goals - 11/04/21 0917       PT SHORT TERM GOAL #1   Title Patient will verbalize proper instructions for sweat management, sock management, residual limb and prosthetic leg care to improve functional independence with prosthetic leg.    Baseline Education initiated (10/07/21)    Time 4    Period Weeks    Status Achieved    Target Date 11/05/21      PT SHORT TERM GOAL #2   Title Patient will be able to go up and down curb step without AD and SBA to improve community access    Baseline not attempted; SBA/CGA with curb    Time 4    Period Weeks    Status Achieved    Target Date 11/05/21      PT SHORT TERM GOAL #3   Title Pt will be able to get up and down from floor with suport surface with supervision only.    Baseline not attempted    Time 4    Period Weeks    Status Revised    Target Date 12/02/21               PT Long Term Goals - 11/04/21 0933       PT LONG TERM GOAL #1   Title Patient will demo 20/30 on FGA to improve functional gait and ambulation    Baseline 10/30 (10/07/21)    Time 8    Period Weeks    Status On-going    Target Date 12/30/21      PT LONG TERM GOAL #2   Title Pt will demo at least 0.81m/s gait speed without AD to improve commnity ambulation    Baseline 0.5 m/s (10/07/21)    Time 8    Period Weeks    Status On-going    Target Date 12/30/21      PT LONG TERM GOAL #3   Title Pt will be able to maintain SLS for 5 sec on L LE to improve balance with curb steps negotiations    Baseline Revised on 11/04/21    Time 8    Period  Weeks    Status New    Target Date  12/30/21                   Plan - 11/11/21 0928     Clinical Impression Statement Pt reported neuropathic pain decreased to 4/10 from 8/10 at end of the session. Patient still continues to need sock management education for proper fit. Patient required min A for balance during ladder drills and requires cueing for safety with stairs    Personal Factors and Comorbidities Past/Current Experience;Comorbidity 2    Comorbidities L BKA, R big toe amputation    Examination-Activity Limitations Lift;Squat;Stairs;Stand    Examination-Participation Restrictions Cleaning;Community Activity;Occupation;Shop;Yard Work    Stability/Clinical Decision Making Stable/Uncomplicated    Rehab Potential Good    PT Frequency 2x / week    PT Duration 6 weeks    PT Treatment/Interventions ADLs/Self Care Home Management;Cryotherapy;Moist Heat;Biofeedback;Gait training;Stair training;Functional mobility training;Therapeutic activities;Therapeutic exercise;Balance training;Manual techniques;Prosthetic Training;Orthotic Fit/Training;Patient/family education;Neuromuscular re-education;Passive range of motion;Scar mobilization;Energy conservation;Joint Manipulations    PT Next Visit Plan Continue with prosthetic education, sock management, sweat management. Work on Aeronautical engineer with equal stance time/step length and normalized base of support (pt tends to be wide). continue to work on balance (static/dynamic) with decreased UE support, LE strengthening. Practice sit<>stands from low surfaces    Consulted and Agree with Plan of Care Patient             Patient will benefit from skilled therapeutic intervention in order to improve the following deficits and impairments:  Abnormal gait, Decreased activity tolerance, Decreased balance, Decreased coordination, Decreased range of motion, Decreased mobility, Decreased endurance, Decreased skin integrity, Decreased strength, Difficulty walking, Impaired flexibility,  Increased edema, Impaired sensation, Postural dysfunction, Pain, Prosthetic Dependency  Visit Diagnosis: Other abnormalities of gait and mobility  Muscle weakness (generalized)  Left below-knee amputee [facility])     Problem List Patient Active Problem List   Diagnosis Date Noted   Type 2 diabetes mellitus with diabetic polyneuropathy, with long-term current use of insulin ([place]) 11/03/2021   Right great toe amputee ([place]) 11/03/2021   Type 2 diabetes mellitus with hyperglycemia, with long-term current use of insulin ([place]) 11/02/2021   Class 2 severe obesity due to excess calories with serious comorbidity and body mass index (BMI) of 36.0 to 36.9 in adult [facility]) 10/15/2021   Hypoglycemia 10/14/2021   Elevated blood pressure reading with diagnosis of hypertension 10/07/2021   Insomnia 10/07/2021   Localized edema 10/07/2021   Right toe amputee ([place])    Left below-knee amputee ([place]) 07/01/2021   Protein-calorie malnutrition ([place]) 06/26/2021   Hypoalbuminemia due to protein-calorie malnutrition ([place]) 06/26/2021   Normocytic anemia 06/26/2021   Heart failure with mid-range ejection fraction ([place]) 06/26/2021   Diabetic nephropathy with proteinuria ([place]) 06/26/2021   Diabetic foot ulcer ([place]) 05/26/2021   Bacterial vaginosis 07/17/2020   Dental caries 04/01/2020   Cracked tooth 04/01/2020   Alcohol use 03/11/2020   Vitamin D deficiency 03/11/2020   Prolonged QT interval 02/26/2020   Chronic combined systolic and diastolic CHF (congestive heart failure) ([place]) 02/26/2020   Heart block AV complete ([place]) 02/11/2020   Exocrine pancreatic insufficiency    Dysmenorrhea 10/02/2019   DM type 2 with diabetic peripheral neuropathy ([place]) 05/30/2019   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 block) 05/29/2019   Bilateral bunions 05/29/2019   Type 2 diabetes mellitus ([place]) 10/21/2016   Smoking 11/26/2013   Hypercholesteremia 02/02/2007   Bipolar 1 disorder ([place]) 02/02/2007  HYPERTENSION, BENIGN SYSTEMIC 02/02/2007    Kerrie Pleasure, PT 11/11/2021, 9:32 AM  [facility] [address] [place], [place], [address] Phone: [phone]   Fax:  [phone]  Name: Sarah Long MRN: [id number] Date of Birth: 05-25-1971

## 2021-11-11 NOTE — Telephone Encounter (Signed)
Patient Advocate Encounter  Prior Authorization for [employer] has been approved.    PA# N/A  Effective dates: 11/10/2021 through 05/11/2022  Per Test Claim Patients co-pay is $0.   Spoke with Pharmacy to Process.  Patient Advocate Fax:  [phone]

## 2021-11-13 ENCOUNTER — Other Ambulatory Visit (HOSPITAL_COMMUNITY): Payer: Self-pay

## 2021-11-13 ENCOUNTER — Ambulatory Visit: Payer: Medicaid Other | Admitting: Physical Therapy

## 2021-11-13 ENCOUNTER — Encounter: Payer: Self-pay | Admitting: Physical Therapy

## 2021-11-13 ENCOUNTER — Other Ambulatory Visit: Payer: Self-pay

## 2021-11-13 DIAGNOSIS — Z89512 Acquired absence of left leg below knee: Secondary | ICD-10-CM | POA: Diagnosis not present

## 2021-11-13 DIAGNOSIS — R2689 Other abnormalities of gait and mobility: Secondary | ICD-10-CM

## 2021-11-13 DIAGNOSIS — M6281 Muscle weakness (generalized): Secondary | ICD-10-CM

## 2021-11-13 NOTE — Therapy (Signed)
[facility] [address] [place], [place], [address] Phone: [phone]   Fax:  [phone]  Physical Therapy Treatment  Patient Details  Name: Sarah Long MRN: [id number] Date of Birth: 1971/06/29 Referring Provider (PT): Dr. Sharol Given   Encounter Date: 11/13/2021   PT End of Session - 11/13/21 0855     Visit Number 8    Number of Visits 21    Date for PT Re-Evaluation 12/30/21    Authorization Type [employer] effective 11/05/21    Authorization Time Period Will need to submit after initial 12 visits    Authorization - Visit Number 3   updated on 11/13/21 as not updated at last session   Authorization - Number of Visits 12    PT Start Time 0848    PT Stop Time 0930    PT Time Calculation (min) 42 min    Equipment Utilized During Treatment Gait belt    Activity Tolerance Patient tolerated treatment well;No increased pain    Behavior During Therapy [facility] for tasks assessed/performed             Past Medical History:  Diagnosis Date   Abnormal Pap smear 1998   Abnormal vaginal bleeding 12/19/2011   Acute urinary retention 06/26/2021   Arthritis    Bacterial infection    Bipolar 1 disorder (HCC)    Blister of second toe of left foot 11/21/2016   Candida vaginitis 07/2007   Depression    recently added wellbutrin-has not taken yet for bipolar   Diabetes in pregnancy    Diabetes mellitus    nph 20U qam and qpm, regular with meals   Diabetic ketoacidosis without coma associated with type 2 diabetes mellitus ([place])    Fibroid    Galactorrhea of right breast 2008   GERD (gastroesophageal reflux disease)    H/O amenorrhea 07/2007   H/O dizziness 10/14/2011   H/O dysmenorrhea 2010   H/O menorrhagia 10/14/2011   H/O varicella    Headache(784.0)    Heavy vaginal bleeding due to contraceptive injection use 10/12/2011   Depo Provera   Herpes    HSV-2 infection 01/03/2009   Hx: UTI (urinary tract infection) 2009    Hypertension    on aldomet   Increased BMI 2010   Irregular uterine bleeding 04/04/2012   Pt has mirena    Obesity 10/14/2011   Oligomenorrhea 07/2007   Pelvic pain in female    Presence of permanent cardiac pacemaker    Preterm labor    Trichomonas    Yeast infection     Past Surgical History:  Procedure Laterality Date   ABDOMINAL AORTOGRAM W/LOWER EXTREMITY N/A 06/17/2021   Procedure: ABDOMINAL AORTOGRAM W/LOWER EXTREMITY;  Surgeon: Wellington Hampshire, MD;  Location: [facility] [place] [facility];  Service: Cardiovascular;  Laterality: N/A;   AMPUTATION Left 06/27/2021   Procedure: AMPUTATION BELOW KNEE;  Surgeon: Newt Minion, MD;  Location: [facility];  Service: Orthopedics;  Laterality: Left;   AMPUTATION Right 09/04/2021   Procedure: RIGHT GREAT TOE AMPUTATION;  Surgeon: Newt Minion, MD;  Location: [facility];  Service: Orthopedics;  Laterality: Right;   CESAREAN SECTION  1991   LEFT HEART CATH AND CORONARY ANGIOGRAPHY N/A 02/10/2020   Procedure: LEFT HEART CATH AND CORONARY ANGIOGRAPHY;  Surgeon: Troy Sine, MD;  Location: [place] [facility];  Service: Cardiovascular;  Laterality: N/A;   PACEMAKER IMPLANT N/A 02/13/2020   Procedure: PACEMAKER IMPLANT;  Surgeon: Constance Haw, MD;  Location:  [facility];  Service: Cardiovascular;  Laterality: N/A;   TEMPORARY PACEMAKER N/A 02/10/2020   Procedure: TEMPORARY PACEMAKER;  Surgeon: Troy Sine, MD;  Location: [facility];  Service: Cardiovascular;  Laterality: N/A;    There were no vitals filed for this visit.   Subjective Assessment - 11/13/21 0849     Subjective Feeling much better today than last time with neuropathy pain. No falls.    Pertinent History L BKA, R big toe amputation    Limitations Standing;Walking;House hold activities    Pain Score 4     Pain Location Leg    Pain Orientation Left    Pain Descriptors / Indicators Dull    Pain Type Neuropathic pain;Phantom pain    Pain Onset More than a month ago     Pain Frequency Constant    Aggravating Factors  walking, intial donning of prosthesis    Pain Relieving Factors resting, medication, removing prosthesis/liner                    [facility] Adult PT Treatment/Exercise - 11/13/21 0856       Transfers   Transfers Sit to Stand;Stand to Sit;Floor to Transfer    Sit to Stand 5: Supervision;With upper extremity assist;From chair/3-in-1    Stand to Sit 5: Supervision;With upper extremity assist;To chair/3-in-1      Ambulation/Gait   Ambulation/Gait Yes    Ambulation/Gait Assistance 5: Supervision    Ambulation Distance (Feet) --   around clinic with session   Assistive device None;Prosthesis    Gait Pattern Step-through pattern;Decreased stride length;Decreased stance time - left;Decreased step length - right;Decreased weight shift to left;Decreased trunk rotation;Wide base of support    Ambulation Surface Level;Indoor      High Level Balance   High Level Balance Activities Side stepping;Marching forwards;Backward walking    High Level Balance Comments on red/blue mat next to counter top: 3 laps each with UE support on counter/HHA opposite counter support with marching foward<>backward walking with min guard to min assist for balance.      Neuro Re-ed    Neuro Re-ed Details  for balance/muscle re-ed: gait around track working on scanning all directions randomly, speed changes and sudden stops/starts with min guard assist for safety.      Knee/Hip Exercises: Aerobic   Nustep UE/LE's level 3.0 x 6 minutes with goal >/= 60 steps per minute for strengthening and activity tolerance.                 Balance Exercises - 11/13/21 0918       Balance Exercises: Standing   Standing Eyes Closed Foam/compliant surface;Narrow base of support (BOS);Wide (BOA);Head turns;Other reps (comment);30 secs;Limitations    Standing Eyes Closed Limitations on airex in corner with no UE support: narrow base of support for EC 30 sec's x 3 reps,  progressing to feet apart for EC head movements left<>right, up<>down for 8-10 reps each with min guard to min assist for balance. cues on posture/weight shifting to assist with balance recovery.                  PT Short Term Goals - 11/04/21 0917       PT SHORT TERM GOAL #1   Title Patient will verbalize proper instructions for sweat management, sock management, residual limb and prosthetic leg care to improve functional independence with prosthetic leg.    Baseline Education initiated (10/07/21)    Time 4  Period Weeks    Status Achieved    Target Date 11/05/21      PT SHORT TERM GOAL #2   Title Patient will be able to go up and down curb step without AD and SBA to improve community access    Baseline not attempted; SBA/CGA with curb    Time 4    Period Weeks    Status Achieved    Target Date 11/05/21      PT SHORT TERM GOAL #3   Title Pt will be able to get up and down from floor with suport surface with supervision only.    Baseline not attempted    Time 4    Period Weeks    Status Revised    Target Date 12/02/21               PT Long Term Goals - 11/04/21 0933       PT LONG TERM GOAL #1   Title Patient will demo 20/30 on FGA to improve functional gait and ambulation    Baseline 10/30 (10/07/21)    Time 8    Period Weeks    Status On-going    Target Date 12/30/21      PT LONG TERM GOAL #2   Title Pt will demo at least 0.37m/s gait speed without AD to improve commnity ambulation    Baseline 0.5 m/s (10/07/21)    Time 8    Period Weeks    Status On-going    Target Date 12/30/21      PT LONG TERM GOAL #3   Title Pt will be able to maintain SLS for 5 sec on L LE to improve balance with curb steps negotiations    Baseline Revised on 11/04/21    Time 8    Period Weeks    Status New    Target Date 12/30/21                   Plan - 11/13/21 0856     Clinical Impression Statement Today's skilled session continued to focus on  strengthening, dynamic gait and balance with prosthesis. Rest breaks needed due to fatigue/shortness of breath. No other issues noted or reported in session. The pt is making steady progress toward goals and should benefit from continued PT to progress toward unmet goals.    Personal Factors and Comorbidities Past/Current Experience;Comorbidity 2    Comorbidities L BKA, R big toe amputation    Examination-Activity Limitations Lift;Squat;Stairs;Stand    Examination-Participation Restrictions Cleaning;Community Activity;Occupation;Shop;Yard Work    Stability/Clinical Decision Making Stable/Uncomplicated    Rehab Potential Good    PT Frequency 2x / week    PT Duration 6 weeks    PT Treatment/Interventions ADLs/Self Care Home Management;Cryotherapy;Moist Heat;Biofeedback;Gait training;Stair training;Functional mobility training;Therapeutic activities;Therapeutic exercise;Balance training;Manual techniques;Prosthetic Training;Orthotic Fit/Training;Patient/family education;Neuromuscular re-education;Passive range of motion;Scar mobilization;Energy conservation;Joint Manipulations    PT Next Visit Plan Continue with prosthetic education, sock management, sweat management. Work on Aeronautical engineer with equal stance time/step length and normalized base of support (pt tends to be wide). continue to work on balance (static/dynamic) with decreased UE support, LE strengthening. Practice sit<>stands from low surfaces    Consulted and Agree with Plan of Care Patient             Patient will benefit from skilled therapeutic intervention in order to improve the following deficits and impairments:  Abnormal gait, Decreased activity tolerance, Decreased balance, Decreased coordination, Decreased range of motion, Decreased mobility, Decreased endurance, Decreased  skin integrity, Decreased strength, Difficulty walking, Impaired flexibility, Increased edema, Impaired sensation, Postural dysfunction, Pain, Prosthetic  Dependency  Visit Diagnosis: Other abnormalities of gait and mobility  Muscle weakness (generalized)     Problem List Patient Active Problem List   Diagnosis Date Noted   Type 2 diabetes mellitus with diabetic polyneuropathy, with long-term current use of insulin ([place]) 11/03/2021   Right great toe amputee ([place]) 11/03/2021   Type 2 diabetes mellitus with hyperglycemia, with long-term current use of insulin ([place]) 11/02/2021   Class 2 severe obesity due to excess calories with serious comorbidity and body mass index (BMI) of 36.0 to 36.9 in adult [facility]) 10/15/2021   Hypoglycemia 10/14/2021   Elevated blood pressure reading with diagnosis of hypertension 10/07/2021   Insomnia 10/07/2021   Localized edema 10/07/2021   Right toe amputee ([place])    Left below-knee amputee ([place]) 07/01/2021   Protein-calorie malnutrition ([place]) 06/26/2021   Hypoalbuminemia due to protein-calorie malnutrition ([place]) 06/26/2021   Normocytic anemia 06/26/2021   Heart failure with mid-range ejection fraction ([place]) 06/26/2021   Diabetic nephropathy with proteinuria ([place]) 06/26/2021   Diabetic foot ulcer ([place]) 05/26/2021   Bacterial vaginosis 07/17/2020   Dental caries 04/01/2020   Cracked tooth 04/01/2020   Alcohol use 03/11/2020   Vitamin D deficiency 03/11/2020   Prolonged QT interval 02/26/2020   Chronic combined systolic and diastolic CHF (congestive heart failure) ([place]) 02/26/2020   Heart block AV complete ([place]) 02/11/2020   Exocrine pancreatic insufficiency    Dysmenorrhea 10/02/2019   DM type 2 with diabetic peripheral neuropathy ([place]) 05/30/2019   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 block) 05/29/2019   Bilateral bunions 05/29/2019   Type 2 diabetes mellitus ([place]) 10/21/2016   Smoking 11/26/2013   Hypercholesteremia 02/02/2007   Bipolar 1 disorder ([place]) 02/02/2007   HYPERTENSION, BENIGN SYSTEMIC 02/02/2007   Willow Ora, PTA, [facility] [address], [place], [place] [address] [phone] 11/13/21, 11:26 AM   Name: Sarah Long MRN: [id number] Date of Birth: 03-31-1971

## 2021-11-17 ENCOUNTER — Other Ambulatory Visit: Payer: Self-pay

## 2021-11-17 ENCOUNTER — Ambulatory Visit (HOSPITAL_COMMUNITY): Payer: Medicaid Other | Attending: Physician Assistant

## 2021-11-17 DIAGNOSIS — R609 Edema, unspecified: Secondary | ICD-10-CM | POA: Insufficient documentation

## 2021-11-17 DIAGNOSIS — R0602 Shortness of breath: Secondary | ICD-10-CM | POA: Diagnosis present

## 2021-11-17 LAB — ECHOCARDIOGRAM COMPLETE
AR max vel: 2.99 cm2
AV Area VTI: 3.43 cm2
AV Area mean vel: 2.94 cm2
AV Mean grad: 4 mmHg
AV Peak grad: 6.9 mmHg
Ao pk vel: 1.32 m/s
Area-P 1/2: 3.92 cm2
S' Lateral: 2.1 cm

## 2021-11-18 ENCOUNTER — Ambulatory Visit: Payer: Medicaid Other | Admitting: Physical Therapy

## 2021-11-18 ENCOUNTER — Telehealth: Payer: Self-pay | Admitting: Physical Therapy

## 2021-11-18 ENCOUNTER — Encounter: Payer: Self-pay | Admitting: Physical Therapy

## 2021-11-18 ENCOUNTER — Other Ambulatory Visit: Payer: Self-pay | Admitting: *Deleted

## 2021-11-18 ENCOUNTER — Encounter: Payer: Self-pay | Admitting: Critical Care Medicine

## 2021-11-18 ENCOUNTER — Other Ambulatory Visit: Payer: Self-pay

## 2021-11-18 ENCOUNTER — Ambulatory Visit: Payer: Medicaid Other | Attending: Critical Care Medicine | Admitting: Critical Care Medicine

## 2021-11-18 ENCOUNTER — Telehealth: Payer: Self-pay

## 2021-11-18 VITALS — BP 132/87 | HR 100 | Resp 16 | Wt 215.2 lb

## 2021-11-18 DIAGNOSIS — Z789 Other specified health status: Secondary | ICD-10-CM

## 2021-11-18 DIAGNOSIS — E1122 Type 2 diabetes mellitus with diabetic chronic kidney disease: Secondary | ICD-10-CM

## 2021-11-18 DIAGNOSIS — Z87891 Personal history of nicotine dependence: Secondary | ICD-10-CM

## 2021-11-18 DIAGNOSIS — K0381 Cracked tooth: Secondary | ICD-10-CM

## 2021-11-18 DIAGNOSIS — Z89512 Acquired absence of left leg below knee: Secondary | ICD-10-CM

## 2021-11-18 DIAGNOSIS — I5042 Chronic combined systolic (congestive) and diastolic (congestive) heart failure: Secondary | ICD-10-CM

## 2021-11-18 DIAGNOSIS — E46 Unspecified protein-calorie malnutrition: Secondary | ICD-10-CM

## 2021-11-18 DIAGNOSIS — E8809 Other disorders of plasma-protein metabolism, not elsewhere classified: Secondary | ICD-10-CM

## 2021-11-18 DIAGNOSIS — E7800 Pure hypercholesterolemia, unspecified: Secondary | ICD-10-CM

## 2021-11-18 DIAGNOSIS — R2689 Other abnormalities of gait and mobility: Secondary | ICD-10-CM | POA: Diagnosis not present

## 2021-11-18 DIAGNOSIS — E1121 Type 2 diabetes mellitus with diabetic nephropathy: Secondary | ICD-10-CM

## 2021-11-18 DIAGNOSIS — N181 Chronic kidney disease, stage 1: Secondary | ICD-10-CM

## 2021-11-18 DIAGNOSIS — K8681 Exocrine pancreatic insufficiency: Secondary | ICD-10-CM

## 2021-11-18 DIAGNOSIS — I10 Essential (primary) hypertension: Secondary | ICD-10-CM

## 2021-11-18 DIAGNOSIS — Z794 Long term (current) use of insulin: Secondary | ICD-10-CM

## 2021-11-18 DIAGNOSIS — M6281 Muscle weakness (generalized): Secondary | ICD-10-CM | POA: Diagnosis not present

## 2021-11-18 DIAGNOSIS — Z5900 Homelessness unspecified: Secondary | ICD-10-CM

## 2021-11-18 LAB — GLUCOSE, POCT (MANUAL RESULT ENTRY): POC Glucose: 104 mg/dl — AB (ref 70–99)

## 2021-11-18 MED ORDER — CARVEDILOL 6.25 MG PO TABS
6.2500 mg | ORAL_TABLET | Freq: Two times a day (BID) | ORAL | 3 refills | Status: DC
  Filled 2021-11-18 – 2022-02-03 (×2): qty 60, 30d supply, fill #0

## 2021-11-18 MED ORDER — ATORVASTATIN CALCIUM 20 MG PO TABS
20.0000 mg | ORAL_TABLET | Freq: Every day | ORAL | 3 refills | Status: DC
  Filled 2021-11-18: qty 90, 90d supply, fill #0

## 2021-11-18 MED ORDER — AMOXICILLIN-POT CLAVULANATE 875-125 MG PO TABS
1.0000 | ORAL_TABLET | Freq: Two times a day (BID) | ORAL | 0 refills | Status: DC
  Filled 2021-11-18: qty 20, 10d supply, fill #0

## 2021-11-18 MED ORDER — CHLORHEXIDINE GLUCONATE 0.12 % MT SOLN
15.0000 mL | Freq: Two times a day (BID) | OROMUCOSAL | 0 refills | Status: DC
  Filled 2021-11-18: qty 473, 16d supply, fill #0

## 2021-11-18 MED ORDER — LOSARTAN POTASSIUM 25 MG PO TABS
25.0000 mg | ORAL_TABLET | Freq: Every day | ORAL | 6 refills | Status: DC
  Filled 2021-11-18 – 2022-02-03 (×2): qty 30, 30d supply, fill #0

## 2021-11-18 NOTE — Patient Instructions (Signed)
You met with Sarah Long our clinical case manager regarding your housing situation  Begin Augmentin 1 twice daily for 10 days for a cheek mucosa infection  Use Peridex mouthwash twice daily gargle rinse and spit for your gingivitis  Please see a dentist you have Medicaid and there are multiple practices that except Medicaid  Refills on carvedilol was sent to our pharmacy please resume this twice daily  No change in your insulin program you are doing fantastic on this and stay on your cholesterol medication daily  Return to see Dr. Joya Gaskins 4 months  Keep follow-up appointments with your diabetic provider

## 2021-11-18 NOTE — Telephone Encounter (Signed)
Created in error.   Willow Ora, PTA, [facility] [address] [place], [place] [address] [phone] 11/18/21, 8:35 AM

## 2021-11-18 NOTE — Progress Notes (Signed)
Established Patient Office Visit  Subjective:  Patient ID: Sarah Long, female    DOB: 24-Oct-1971  Age: 50 y.o. MRN: [id number]  CC:  Chief Complaint  Patient presents with   Diabetes    HPI Sarah Long presents for primary care follow-up and diabetes management  Since last seen in October the patient has continued improvement.  She has been followed closely by endocrinology and her A1c is now down to 7.4.  It had been greater than 12.  The patient also now has a Dexcom glucometer and has showing data suggesting she is staying in the 100-150 range of her blood sugars.  The patient has had a left below-knee amputation now has a functional orthotic.  Patient is followed by rehab.  Patient's phantom limb pain is being managed by the rehab physician and she states is markedly better.  She has had 1 toe amputated on the right foot.  Recently developed a sore internally in her mouth.  This patient has poor dentition and needs to be seen by dentist.  Also she is living in a hotel and may be losing her housing soon and needs assistance in this regard.  Blood pressure is good on arrival 132/87    Past Medical History:  Diagnosis Date   Abnormal Pap smear 1998   Abnormal vaginal bleeding 12/19/2011   Acute urinary retention 06/26/2021   Arthritis    Bacterial infection    Bipolar 1 disorder (HCC)    Blister of second toe of left foot 11/21/2016   Candida vaginitis 07/2007   Depression    recently added wellbutrin-has not taken yet for bipolar   Diabetes in pregnancy    Diabetes mellitus    nph 20U qam and qpm, regular with meals   Diabetic ketoacidosis without coma associated with type 2 diabetes mellitus ([place])    Fibroid    Galactorrhea of right breast 2008   GERD (gastroesophageal reflux disease)    H/O amenorrhea 07/2007   H/O dizziness 10/14/2011   H/O dysmenorrhea 2010   H/O menorrhagia 10/14/2011   H/O varicella    Headache(784.0)    Heavy vaginal bleeding due to  contraceptive injection use 10/12/2011   Depo Provera   Herpes    HSV-2 infection 01/03/2009   Hx: UTI (urinary tract infection) 2009   Hypertension    on aldomet   Hypoalbuminemia due to protein-calorie malnutrition ([place]) 06/26/2021   Increased BMI 2010   Irregular uterine bleeding 04/04/2012   Pt has mirena    Obesity 10/14/2011   Oligomenorrhea 07/2007   Pelvic pain in female    Presence of permanent cardiac pacemaker    Preterm labor    Trichomonas    Yeast infection     Past Surgical History:  Procedure Laterality Date   ABDOMINAL AORTOGRAM W/LOWER EXTREMITY N/A 06/17/2021   Procedure: ABDOMINAL AORTOGRAM W/LOWER EXTREMITY;  Surgeon: Wellington Hampshire, MD;  Location: [facility];  Service: Cardiovascular;  Laterality: N/A;   AMPUTATION Left 06/27/2021   Procedure: AMPUTATION BELOW KNEE;  Surgeon: Newt Minion, MD;  Location: [facility];  Service: Orthopedics;  Laterality: Left;   AMPUTATION Right 09/04/2021   Procedure: RIGHT GREAT TOE AMPUTATION;  Surgeon: Newt Minion, MD;  Location: [facility];  Service: Orthopedics;  Laterality: Right;   CESAREAN SECTION  1991   LEFT HEART CATH AND CORONARY ANGIOGRAPHY N/A 02/10/2020   Procedure: LEFT HEART CATH AND CORONARY ANGIOGRAPHY;  Surgeon: Troy Sine, MD;  Location:  [facility];  Service: Cardiovascular;  Laterality: N/A;   PACEMAKER IMPLANT N/A 02/13/2020   Procedure: PACEMAKER IMPLANT;  Surgeon: Constance Haw, MD;  Location: [facility];  Service: Cardiovascular;  Laterality: N/A;   TEMPORARY PACEMAKER N/A 02/10/2020   Procedure: TEMPORARY PACEMAKER;  Surgeon: Troy Sine, MD;  Location: [facility];  Service: Cardiovascular;  Laterality: N/A;    Family History  Problem Relation Age of Onset   Hypertension Mother    Diabetes Mother    Heart disease Mother    Hypertension Father    Diabetes Father    Heart disease Father    Stroke Maternal Grandfather    Other Neg Hx     Social History    Socioeconomic History   Marital status: Significant Other    Spouse name: Not on file   Number of children: Not on file   Years of education: Not on file   Highest education level: Not on file  Occupational History   Not on file  Tobacco Use   Smoking status: Former    Packs/day: 0.50    Years: 20.00    Pack years: 10.00    Types: Cigarettes    Quit date: 07/01/2021    Years since quitting: 0.3   Smokeless tobacco: Never  Vaping Use   Vaping Use: Never used  Substance and Sexual Activity   Alcohol use: No   Drug use: No   Sexual activity: Yes    Birth control/protection: None  Other Topics Concern   Not on file  Social History Narrative   Not on file   Social Determinants of Health   Financial Resource Strain: Not on file  Food Insecurity: Not on file  Transportation Needs: Not on file  Physical Activity: Not on file  Stress: Not on file  Social Connections: Not on file  Intimate Partner Violence: Not on file    Outpatient Medications Prior to Visit  Medication Sig Dispense Refill   acetaminophen (TYLENOL) 325 MG tablet Take 650 mg by mouth every 6 (six) hours as needed.     Continuous Blood Gluc Receiver (DEXCOM G6 RECEIVER) DEVI Check sugar daily 1 each 0   Continuous Blood Gluc Sensor (DEXCOM G6 SENSOR) MISC 1 Device by Does not apply route as directed. 9 each 3   Continuous Blood Gluc Transmit (DEXCOM G6 TRANSMITTER) MISC 1 Device by Does not apply route as directed. 1 each 3   insulin glargine (LANTUS SOLOSTAR) 100 UNIT/ML Solostar Pen Inject 35 Units into the skin daily. 30 mL 6   insulin lispro (HUMALOG) 100 UNIT/ML KwikPen Inject 10 Units into the skin 3 (three) times daily before meals. 30 mL 3   Insulin Pen Needle 31G X 5 MM MISC Use as directed in the morning, at noon, in the evening, and at bedtime. 400 each 3   Multiple Vitamins-Minerals (ADULT ONE DAILY GUMMIES PO) Take 1 capsule by mouth daily.     insulin glargine (LANTUS SOLOSTAR) 100 UNIT/ML  Solostar Pen Inject 35 Units into the skin daily. 15 mL 11   Accu-Chek Softclix Lancets lancets Use as directed up to 4 times daily 100 each 5   Blood Glucose Monitoring Suppl (ACCU-CHEK GUIDE) w/Device KIT Use as directed (Patient not taking: Reported on 11/18/2021) 1 kit 0   carvedilol (COREG) 6.25 MG tablet Take 1 tablet (6.25 mg total) by mouth 2 (two) times daily with a meal. (Patient not taking: Reported on 11/18/2021) 60 tablet 3  furosemide (LASIX) 20 MG tablet Take 1 tablet (20 mg total) by mouth daily. (Patient not taking: Reported on 11/18/2021) 30 tablet 3   glucose blood (ACCU-CHEK GUIDE) test strip Use as instructed up to 4 times daily (Patient not taking: Reported on 11/18/2021) 100 each 12   HYDROcodone-acetaminophen (NORCO/VICODIN) 5-325 MG tablet Take 1 tablet by mouth every 4 (four) hours as needed for moderate pain. (Patient not taking: Reported on 10/22/2021) 30 tablet 0   No facility-administered medications prior to visit.    Allergies  Allergen Reactions   Strawberry Extract Hives   Sulfa Antibiotics Hives and Itching    ROS Review of Systems  Constitutional:  Negative for chills, diaphoresis and fever.  HENT:  Negative for congestion, hearing loss, nosebleeds, sore throat and tinnitus.        Mouth ulcer inside of right cheek mucosa  Eyes:  Negative for photophobia and redness.  Respiratory:  Negative for cough, shortness of breath, wheezing and stridor.   Cardiovascular:  Negative for chest pain, palpitations and leg swelling.  Gastrointestinal:  Negative for abdominal pain, blood in stool, constipation, diarrhea, nausea and vomiting.  Endocrine: Negative for polydipsia.  Genitourinary:  Negative for dysuria, flank pain, frequency, hematuria and urgency.  Musculoskeletal:  Negative for back pain, myalgias and neck pain.  Skin:  Negative for rash.  Allergic/Immunologic: Negative for environmental allergies.  Neurological:  Negative for dizziness, tremors,  seizures, weakness and headaches.  Hematological:  Does not bruise/bleed easily.  Psychiatric/Behavioral:  Negative for suicidal ideas. The patient is not nervous/anxious.      Objective:    Physical Exam Vitals reviewed.  Constitutional:      Appearance: Normal appearance. She is well-developed. She is obese. She is not diaphoretic.  HENT:     Head: Normocephalic and atraumatic.     Nose: No nasal deformity, septal deviation, mucosal edema or rhinorrhea.     Right Sinus: No maxillary sinus tenderness or frontal sinus tenderness.     Left Sinus: No maxillary sinus tenderness or frontal sinus tenderness.     Mouth/Throat:     Mouth: Mucous membranes are moist.     Pharynx: No oropharyngeal exudate.     Comments: There is a slight ulceration inside the right cheek mucosa and there is a fractured molar which likely has caused the injury Eyes:     General: No scleral icterus.    Conjunctiva/sclera: Conjunctivae normal.     Pupils: Pupils are equal, round, and reactive to light.  Neck:     Thyroid: No thyromegaly.     Vascular: No carotid bruit or JVD.     Trachea: Trachea normal. No tracheal tenderness or tracheal deviation.  Cardiovascular:     Rate and Rhythm: Normal rate and regular rhythm.     Chest Wall: PMI is not displaced.     Pulses: Normal pulses. No decreased pulses.     Heart sounds: Normal heart sounds, S1 normal and S2 normal. Heart sounds not distant. No murmur heard. No systolic murmur is present.  No diastolic murmur is present.    No friction rub. No gallop. No S3 or S4 sounds.  Pulmonary:     Effort: No tachypnea, accessory muscle usage or respiratory distress.     Breath sounds: No stridor. No decreased breath sounds, wheezing, rhonchi or rales.  Chest:     Chest wall: No tenderness.  Abdominal:     General: Bowel sounds are normal. There is no distension.     Palpations:  Abdomen is soft. Abdomen is not rigid.     Tenderness: There is no abdominal  tenderness. There is no guarding or rebound.  Musculoskeletal:        General: Normal range of motion.     Cervical back: Normal range of motion and neck supple. No edema, erythema or rigidity. No muscular tenderness. Normal range of motion.     Comments: Orthotic on left below-knee amputation left leg functioning well  Lymphadenopathy:     Head:     Right side of head: No submental or submandibular adenopathy.     Left side of head: No submental or submandibular adenopathy.     Cervical: No cervical adenopathy.  Skin:    General: Skin is warm and dry.     Coloration: Skin is not pale.     Findings: No rash.     Nails: There is no clubbing.  Neurological:     Mental Status: She is alert and oriented to person, place, and time.     Sensory: No sensory deficit.  Psychiatric:        Mood and Affect: Mood normal.        Speech: Speech normal.        Behavior: Behavior normal.        Thought Content: Thought content normal.        Judgment: Judgment normal.    BP 132/87    Pulse 100    Resp 16    Wt 215 lb 3.2 oz (97.6 kg)    SpO2 100%    BMI 36.94 kg/m  Wt Readings from Last 3 Encounters:  11/18/21 215 lb 3.2 oz (97.6 kg)  11/10/21 210 lb 6.4 oz (95.4 kg)  11/02/21 212 lb (96.2 kg)     There are no preventive care reminders to display for this patient.   There are no preventive care reminders to display for this patient.  Lab Results  Component Value Date   TSH 4.396 02/11/2020   Lab Results  Component Value Date   WBC 7.5 09/04/2021   HGB 9.9 (L) 09/04/2021   HCT 31.7 (L) 09/04/2021   MCV 85.9 09/04/2021   PLT 224 09/04/2021   Lab Results  Component Value Date   NA 138 10/22/2021   K 4.8 10/22/2021   CO2 19 (L) 10/22/2021   GLUCOSE 129 (H) 10/22/2021   BUN 19 10/22/2021   CREATININE 0.90 10/22/2021   BILITOT <0.2 10/07/2021   ALKPHOS 87 10/07/2021   AST 17 10/07/2021   ALT 6 10/07/2021   PROT 7.2 10/07/2021   ALBUMIN 3.8 10/07/2021   CALCIUM 8.7  10/22/2021   ANIONGAP 8 09/04/2021   EGFR 79 10/22/2021   Lab Results  Component Value Date   CHOL 98 (L) 06/18/2021   Lab Results  Component Value Date   HDL 37 (L) 06/18/2021   Lab Results  Component Value Date   LDLCALC 40 06/18/2021   Lab Results  Component Value Date   TRIG 117 06/18/2021   Lab Results  Component Value Date   CHOLHDL 2.6 06/18/2021   Lab Results  Component Value Date   HGBA1C 7.4 (A) 10/14/2021      Assessment & Plan:   Problem List Items Addressed This Visit       Cardiovascular and Mediastinum   HYPERTENSION, BENIGN SYSTEMIC (Chronic)    Blood pressure at goal  Continue carvedilol twice daily      Relevant Medications   carvedilol (COREG) 6.25 MG tablet  atorvastatin (LIPITOR) 20 MG tablet   Chronic combined systolic and diastolic CHF (congestive heart failure) (HCC)    Heart failure currently compensated at this time  Patient's not on furosemide on a regular basis takes this as needed  Continue healthy heart diet      Relevant Medications   carvedilol (COREG) 6.25 MG tablet   atorvastatin (LIPITOR) 20 MG tablet   RESOLVED: Elevated blood pressure reading with diagnosis of hypertension   Relevant Medications   carvedilol (COREG) 6.25 MG tablet   atorvastatin (LIPITOR) 20 MG tablet     Digestive   Exocrine pancreatic insufficiency    Currently not needing any enzyme replacement      Cracked tooth    Cracked tooth inside mouth patient needs dental care given Medicaid dental resources she needs to make an appointment she now has Medicaid  For ulceration inside the mouth will give Peridex mouthwash        Endocrine   Type 2 diabetes mellitus ([place]) - Primary    Type 2 diabetes stable at this time A1c approaching goal management per endocrinology continue current insulin program      Relevant Medications   atorvastatin (LIPITOR) 20 MG tablet   Other Relevant Orders   POCT glucose (manual entry) (Completed)   Diabetic  nephropathy with proteinuria ([place])    Patient has been followed by nephrology patient has some proteinuria however this is improved with diabetic management and blood pressure control        Relevant Medications   atorvastatin (LIPITOR) 20 MG tablet     Other   Hypercholesteremia    Continue with current program of statin      Relevant Medications   carvedilol (COREG) 6.25 MG tablet   atorvastatin (LIPITOR) 20 MG tablet   Former smoker    Patient congratulated and quitting smoking      Alcohol use    Not currently using alcohol      Left below-knee amputee ([place])    Tolerating left below-knee orthotic      Homelessness    Patient currently living in a motel room and may be losing this soon because her church was paying for the rent  She has her children in the same room as well  Connected patient with nurse case manager for resources on housing      RESOLVED: Hypoalbuminemia due to protein-calorie malnutrition (HCC)    Albumin levels normal now       Meds ordered this encounter  Medications   carvedilol (COREG) 6.25 MG tablet    Sig: Take 1 tablet (6.25 mg total) by mouth 2 (two) times daily with a meal.    Dispense:  60 tablet    Refill:  3   chlorhexidine (PERIDEX) 0.12 % solution    Sig: Use as directed 15 mLs in the mouth or throat 2 (two) times daily.    Dispense:  473 mL    Refill:  0   amoxicillin-clavulanate (AUGMENTIN) 875-125 MG tablet    Sig: Take 1 tablet by mouth 2 (two) times daily.    Dispense:  20 tablet    Refill:  0   atorvastatin (LIPITOR) 20 MG tablet    Sig: Take 1 tablet (20 mg total) by mouth daily.    Dispense:  90 tablet    Refill:  3   38 minutes spent with this patient on patient education complex [facility] of high of here for disease management high risk of hospitalization and death medication  refills Follow-up: Return in about 4 months (around 03/19/2022).    Asencion Noble, MD

## 2021-11-18 NOTE — Therapy (Signed)
[facility] [address] [place], [place], [address] Phone: [phone]   Fax:  [phone]  Physical Therapy Treatment  Patient Details  Name: Sarah Long MRN: [id number] Date of Birth: May 02, 1971 Referring Provider (PT): Dr. Sharol Given   Encounter Date: 11/18/2021   PT End of Session - 11/18/21 0849     Visit Number 9    Number of Visits 21    Date for PT Re-Evaluation 12/30/21    Authorization Type [employer] effective 11/05/21    Authorization Time Period Will need to submit after initial 12 visits    Authorization - Visit Number 4    Authorization - Number of Visits 12    PT Start Time 0848    PT Stop Time 0929    PT Time Calculation (min) 41 min    Equipment Utilized During Treatment Gait belt    Activity Tolerance Patient tolerated treatment well;No increased pain    Behavior During Therapy [facility] for tasks assessed/performed             Past Medical History:  Diagnosis Date   Abnormal Pap smear 1998   Abnormal vaginal bleeding 12/19/2011   Acute urinary retention 06/26/2021   Arthritis    Bacterial infection    Bipolar 1 disorder (HCC)    Blister of second toe of left foot 11/21/2016   Candida vaginitis 07/2007   Depression    recently added wellbutrin-has not taken yet for bipolar   Diabetes in pregnancy    Diabetes mellitus    nph 20U qam and qpm, regular with meals   Diabetic ketoacidosis without coma associated with type 2 diabetes mellitus ([place])    Fibroid    Galactorrhea of right breast 2008   GERD (gastroesophageal reflux disease)    H/O amenorrhea 07/2007   H/O dizziness 10/14/2011   H/O dysmenorrhea 2010   H/O menorrhagia 10/14/2011   H/O varicella    Headache(784.0)    Heavy vaginal bleeding due to contraceptive injection use 10/12/2011   Depo Provera   Herpes    HSV-2 infection 01/03/2009   Hx: UTI (urinary tract infection) 2009   Hypertension    on aldomet   Increased BMI  2010   Irregular uterine bleeding 04/04/2012   Pt has mirena    Obesity 10/14/2011   Oligomenorrhea 07/2007   Pelvic pain in female    Presence of permanent cardiac pacemaker    Preterm labor    Trichomonas    Yeast infection     Past Surgical History:  Procedure Laterality Date   ABDOMINAL AORTOGRAM W/LOWER EXTREMITY N/A 06/17/2021   Procedure: ABDOMINAL AORTOGRAM W/LOWER EXTREMITY;  Surgeon: Wellington Hampshire, MD;  Location: [facility] [place] [facility];  Service: Cardiovascular;  Laterality: N/A;   AMPUTATION Left 06/27/2021   Procedure: AMPUTATION BELOW KNEE;  Surgeon: Newt Minion, MD;  Location: [facility];  Service: Orthopedics;  Laterality: Left;   AMPUTATION Right 09/04/2021   Procedure: RIGHT GREAT TOE AMPUTATION;  Surgeon: Newt Minion, MD;  Location: [facility];  Service: Orthopedics;  Laterality: Right;   CESAREAN SECTION  1991   LEFT HEART CATH AND CORONARY ANGIOGRAPHY N/A 02/10/2020   Procedure: LEFT HEART CATH AND CORONARY ANGIOGRAPHY;  Surgeon: Troy Sine, MD;  Location: [place] [facility];  Service: Cardiovascular;  Laterality: N/A;   PACEMAKER IMPLANT N/A 02/13/2020   Procedure: PACEMAKER IMPLANT;  Surgeon: Constance Haw, MD;  Location: [facility];  Service: Cardiovascular;  Laterality: N/A;  TEMPORARY PACEMAKER N/A 02/10/2020   Procedure: TEMPORARY PACEMAKER;  Surgeon: Troy Sine, MD;  Location: [facility];  Service: Cardiovascular;  Laterality: N/A;    There were no vitals filed for this visit.   Subjective Assessment - 11/18/21 0849     Subjective No new complaints. No falls or pain to report.    Pertinent History L BKA, R big toe amputation    Limitations Standing;Walking;House hold activities    Currently in Pain? No/denies    Pain Score 0-No pain                     [facility] Adult PT Treatment/Exercise - 11/18/21 0851       Transfers   Transfers Sit to Stand;Stand to Sit;Floor to Transfer    Sit to Stand 5: Supervision;With upper  extremity assist;From chair/3-in-1    Stand to Sit 5: Supervision;With upper extremity assist;To chair/3-in-1      Ambulation/Gait   Ambulation/Gait Yes    Ambulation/Gait Assistance 5: Supervision    Ambulation Distance (Feet) --   around clinic with session   Assistive device None;Prosthesis    Gait Pattern Step-through pattern;Decreased stride length;Decreased stance time - left;Decreased step length - right;Decreased weight shift to left;Decreased trunk rotation;Wide base of support    Ambulation Surface Level;Indoor      Neuro Re-ed    Neuro Re-ed Details  for balance/muscle re-ed: gait along ~50 foot hallway working on head movements left<>forward<>right, then up<>forward<>down for 2 laps each, min guard to min assist with veering noted.      Knee/Hip Exercises: Aerobic   Other Aerobic Scifit level 3.5 for 8 minutes with goal >/= 50 steps per minute for strengthening and activity tolerance.      Prosthetics   Current prosthetic wear tolerance (days/week)  daily    Current prosthetic wear tolerance (#hours/day)  all awake hours with 1-2 short duration breaks (~15 minutes)    Residual limb condition  intact with dry flaky skin at distal end. removed loose skin with tweezers.                 Balance Exercises - 11/18/21 0914       Balance Exercises: Standing   Rockerboard Anterior/posterior;EO;EC;20 seconds;Other reps (comment);Intermittent UE support;Limitations    Rockerboard Limitations on balance board in anterior/posterior direction in corner with chair in front for safety: alternating UE raises progressing to bil UE raises with min guard assist; no UE support for EC 15 sec's x 3-4 reps with min guard to min assist for balance. with fingertip support on chair back: EC head movements left<>right, up<>down for 8-9 reps each, min guard to min assist for balance. cues on posture and weight shifting to assist with balance.                  PT Short Term Goals -  11/04/21 0917       PT SHORT TERM GOAL #1   Title Patient will verbalize proper instructions for sweat management, sock management, residual limb and prosthetic leg care to improve functional independence with prosthetic leg.    Baseline Education initiated (10/07/21)    Time 4    Period Weeks    Status Achieved    Target Date 11/05/21      PT SHORT TERM GOAL #2   Title Patient will be able to go up and down curb step without AD and SBA to improve community access    Baseline not attempted;  SBA/CGA with curb    Time 4    Period Weeks    Status Achieved    Target Date 11/05/21      PT SHORT TERM GOAL #3   Title Pt will be able to get up and down from floor with suport surface with supervision only.    Baseline not attempted    Time 4    Period Weeks    Status Revised    Target Date 12/02/21               PT Long Term Goals - 11/04/21 0933       PT LONG TERM GOAL #1   Title Patient will demo 20/30 on FGA to improve functional gait and ambulation    Baseline 10/30 (10/07/21)    Time 8    Period Weeks    Status On-going    Target Date 12/30/21      PT LONG TERM GOAL #2   Title Pt will demo at least 0.29m/s gait speed without AD to improve commnity ambulation    Baseline 0.5 m/s (10/07/21)    Time 8    Period Weeks    Status On-going    Target Date 12/30/21      PT LONG TERM GOAL #3   Title Pt will be able to maintain SLS for 5 sec on L LE to improve balance with curb steps negotiations    Baseline Revised on 11/04/21    Time 8    Period Weeks    Status New    Target Date 12/30/21                   Plan - 11/18/21 0850     Clinical Impression Statement Today's skilled session continued to focus on strengthening and balance with rest breaks taken as needed due to LE fatigue. No other issues noted or reported in session. The pt is making steady progress toward goals and should benefit from continued PT to progress toward unmet goals.    Personal  Factors and Comorbidities Past/Current Experience;Comorbidity 2    Comorbidities L BKA, R big toe amputation    Examination-Activity Limitations Lift;Squat;Stairs;Stand    Examination-Participation Restrictions Cleaning;Community Activity;Occupation;Shop;Yard Work    Stability/Clinical Decision Making Stable/Uncomplicated    Rehab Potential Good    PT Frequency 2x / week    PT Duration 6 weeks    PT Treatment/Interventions ADLs/Self Care Home Management;Cryotherapy;Moist Heat;Biofeedback;Gait training;Stair training;Functional mobility training;Therapeutic activities;Therapeutic exercise;Balance training;Manual techniques;Prosthetic Training;Orthotic Fit/Training;Patient/family education;Neuromuscular re-education;Passive range of motion;Scar mobilization;Energy conservation;Joint Manipulations    PT Next Visit Plan practice transfers from floor without out prosthesis as pt wants to be able to take tub baths; continue to work on dynamic gait/balance and balance on compliant surfaces without/with vision removed    Consulted and Agree with Plan of Care Patient             Patient will benefit from skilled therapeutic intervention in order to improve the following deficits and impairments:  Abnormal gait, Decreased activity tolerance, Decreased balance, Decreased coordination, Decreased range of motion, Decreased mobility, Decreased endurance, Decreased skin integrity, Decreased strength, Difficulty walking, Impaired flexibility, Increased edema, Impaired sensation, Postural dysfunction, Pain, Prosthetic Dependency  Visit Diagnosis: Other abnormalities of gait and mobility  Muscle weakness (generalized)     Problem List Patient Active Problem List   Diagnosis Date Noted   Type 2 diabetes mellitus with diabetic polyneuropathy, with long-term current use of insulin ([place]) 11/03/2021   Right great  toe amputee ([place]) 11/03/2021   Type 2 diabetes mellitus with hyperglycemia, with long-term  current use of insulin ([place]) 11/02/2021   Class 2 severe obesity due to excess calories with serious comorbidity and body mass index (BMI) of 36.0 to 36.9 in adult [facility]) 10/15/2021   Hypoglycemia 10/14/2021   Elevated blood pressure reading with diagnosis of hypertension 10/07/2021   Insomnia 10/07/2021   Localized edema 10/07/2021   Right toe amputee ([place])    Left below-knee amputee ([place]) 07/01/2021   Protein-calorie malnutrition ([place]) 06/26/2021   Hypoalbuminemia due to protein-calorie malnutrition ([place]) 06/26/2021   Normocytic anemia 06/26/2021   Heart failure with mid-range ejection fraction ([place]) 06/26/2021   Diabetic nephropathy with proteinuria ([place]) 06/26/2021   Diabetic foot ulcer ([place]) 05/26/2021   Bacterial vaginosis 07/17/2020   Dental caries 04/01/2020   Cracked tooth 04/01/2020   Alcohol use 03/11/2020   Vitamin D deficiency 03/11/2020   Prolonged QT interval 02/26/2020   Chronic combined systolic and diastolic CHF (congestive heart failure) ([place]) 02/26/2020   Heart block AV complete ([place]) 02/11/2020   Exocrine pancreatic insufficiency    Dysmenorrhea 10/02/2019   DM type 2 with diabetic peripheral neuropathy ([place]) 05/30/2019   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 block) 05/29/2019   Bilateral bunions 05/29/2019   Type 2 diabetes mellitus ([place]) 10/21/2016   Smoking 11/26/2013   Hypercholesteremia 02/02/2007   Bipolar 1 disorder ([place]) 02/02/2007   HYPERTENSION, BENIGN SYSTEMIC 02/02/2007    Willow Ora, PTA, [facility] [address] [place], [place] [address] [phone] 11/18/21, 10:15 AM   Name: Sarah Long MRN: [id number] Date of Birth: 1971/06/17

## 2021-11-18 NOTE — Telephone Encounter (Signed)
Met with the patient when she was in the clinic today. Discussed her current housing situation.  She is currently living in  a motel with her 3 children.  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 She is now out of a job and is not sure if she will be hired back.  She was paying $175/week or $700/month for her room. She said she has exhausted all resources for financial assistance. Her church recently paid the $175 that is currently due. The church also paid to fix her Lucianne Lei. In addition, she said that she expects the rent to increase next year.   She has been residing at the motel for 2.5 years and has been in and out of shelters prior to that. She has applied for a section 8 voucher but is not sure of the status of that application. She remembers having a counselor at the housing authority and recalls the name Loralie Champagne.  She plans to contact the housing authority to determine her status with section 8. She has been to the [facility] and is aware of their services.   She said one of her sons receives disability.  She has been working with the [employer] to submit an application for disability for herself.  At this time, she is concerned about the near future - will she be employed? Unemployed? Approved for disability? Homeless? How much will the rent be increased?   Explained to her that this CM will continue to inquire about other possible resources for housing but at this time, as she is aware, there is very little affordable housing available and as far as financial assistance, she seems to have exhausted all resources.

## 2021-11-19 ENCOUNTER — Other Ambulatory Visit: Payer: Self-pay

## 2021-11-19 ENCOUNTER — Encounter: Payer: Self-pay | Admitting: Critical Care Medicine

## 2021-11-19 DIAGNOSIS — Z5900 Homelessness unspecified: Secondary | ICD-10-CM | POA: Insufficient documentation

## 2021-11-19 HISTORY — DX: Homelessness unspecified: Z59.00

## 2021-11-19 NOTE — Assessment & Plan Note (Signed)
Currently not needing any enzyme replacement

## 2021-11-19 NOTE — Assessment & Plan Note (Signed)
Patient currently living in a motel room and may be losing this soon because her church was paying for the rent  She has her children in the same room as well  Connected patient with nurse case manager for resources on housing

## 2021-11-19 NOTE — Assessment & Plan Note (Signed)
Blood pressure at goal  Continue carvedilol twice daily

## 2021-11-19 NOTE — Assessment & Plan Note (Signed)
Not currently using alcohol 

## 2021-11-19 NOTE — Assessment & Plan Note (Signed)
Heart failure currently compensated at this time  Patient's not on furosemide on a regular basis takes this as needed  Continue healthy heart diet

## 2021-11-19 NOTE — Assessment & Plan Note (Signed)
Patient congratulated and quitting smoking

## 2021-11-19 NOTE — Assessment & Plan Note (Signed)
Tolerating left below-knee orthotic

## 2021-11-19 NOTE — Assessment & Plan Note (Signed)
Type 2 diabetes stable at this time A1c approaching goal management per endocrinology continue current insulin program

## 2021-11-19 NOTE — Assessment & Plan Note (Signed)
Continue with current program of statin

## 2021-11-19 NOTE — Assessment & Plan Note (Signed)
Cracked tooth inside mouth patient needs dental care given Medicaid dental resources she needs to make an appointment she now has Medicaid  For ulceration inside the mouth will give Peridex mouthwash

## 2021-11-19 NOTE — Assessment & Plan Note (Signed)
Albumin levels normal now

## 2021-11-19 NOTE — Assessment & Plan Note (Signed)
Patient has been followed by nephrology patient has some proteinuria however this is improved with diabetic management and blood pressure control

## 2021-11-20 ENCOUNTER — Ambulatory Visit: Payer: Medicaid Other

## 2021-11-20 ENCOUNTER — Other Ambulatory Visit: Payer: Self-pay

## 2021-11-20 DIAGNOSIS — R2689 Other abnormalities of gait and mobility: Secondary | ICD-10-CM

## 2021-11-20 DIAGNOSIS — Z89512 Acquired absence of left leg below knee: Secondary | ICD-10-CM

## 2021-11-20 DIAGNOSIS — M6281 Muscle weakness (generalized): Secondary | ICD-10-CM

## 2021-11-20 NOTE — Therapy (Signed)
[facility] [address] [place], [place], [address] Phone: [phone]   Fax:  [phone]  Physical Therapy Treatment  Patient Details  Name: Sarah Long MRN: [id number] Date of Birth: 01/09/1971 Referring Provider (PT): Dr. Sharol Given   Encounter Date: 11/20/2021   PT End of Session - 11/20/21 0844     Visit Number 10    Number of Visits 21    Date for PT Re-Evaluation 12/30/21    Authorization Type [employer] effective 11/05/21    Authorization Time Period Will need to submit after initial 12 visits    Authorization - Visit Number 10    Authorization - Number of Visits 12    PT Start Time 0845    PT Stop Time 0930    PT Time Calculation (min) 45 min    Equipment Utilized During Treatment Gait belt    Activity Tolerance Patient tolerated treatment well;No increased pain    Behavior During Therapy [facility] for tasks assessed/performed             Past Medical History:  Diagnosis Date   Abnormal Pap smear 1998   Abnormal vaginal bleeding 12/19/2011   Acute urinary retention 06/26/2021   Arthritis    Bacterial infection    Bipolar 1 disorder (HCC)    Blister of second toe of left foot 11/21/2016   Candida vaginitis 07/2007   Depression    recently added wellbutrin-has not taken yet for bipolar   Diabetes in pregnancy    Diabetes mellitus    nph 20U qam and qpm, regular with meals   Diabetic ketoacidosis without coma associated with type 2 diabetes mellitus ([place])    Fibroid    Galactorrhea of right breast 2008   GERD (gastroesophageal reflux disease)    H/O amenorrhea 07/2007   H/O dizziness 10/14/2011   H/O dysmenorrhea 2010   H/O menorrhagia 10/14/2011   H/O varicella    Headache(784.0)    Heavy vaginal bleeding due to contraceptive injection use 10/12/2011   Depo Provera   Herpes    HSV-2 infection 01/03/2009   Hx: UTI (urinary tract infection) 2009   Hypertension    on aldomet   Hypoalbuminemia  due to protein-calorie malnutrition ([place]) 06/26/2021   Increased BMI 2010   Irregular uterine bleeding 04/04/2012   Pt has mirena    Obesity 10/14/2011   Oligomenorrhea 07/2007   Pelvic pain in female    Presence of permanent cardiac pacemaker    Preterm labor    Trichomonas    Yeast infection     Past Surgical History:  Procedure Laterality Date   ABDOMINAL AORTOGRAM W/LOWER EXTREMITY N/A 06/17/2021   Procedure: ABDOMINAL AORTOGRAM W/LOWER EXTREMITY;  Surgeon: Wellington Hampshire, MD;  Location: [place] [facility];  Service: Cardiovascular;  Laterality: N/A;   AMPUTATION Left 06/27/2021   Procedure: AMPUTATION BELOW KNEE;  Surgeon: Newt Minion, MD;  Location: [facility];  Service: Orthopedics;  Laterality: Left;   AMPUTATION Right 09/04/2021   Procedure: RIGHT GREAT TOE AMPUTATION;  Surgeon: Newt Minion, MD;  Location: [facility];  Service: Orthopedics;  Laterality: Right;   CESAREAN SECTION  1991   LEFT HEART CATH AND CORONARY ANGIOGRAPHY N/A 02/10/2020   Procedure: LEFT HEART CATH AND CORONARY ANGIOGRAPHY;  Surgeon: Troy Sine, MD;  Location: [facility];  Service: Cardiovascular;  Laterality: N/A;   PACEMAKER IMPLANT N/A 02/13/2020   Procedure: PACEMAKER IMPLANT;  Surgeon: Constance Haw, MD;  Location: [facility]  INVASIVE CV LAB;  Service: Cardiovascular;  Laterality: N/A;   TEMPORARY PACEMAKER N/A 02/10/2020   Procedure: TEMPORARY PACEMAKER;  Surgeon: Troy Sine, MD;  Location: [facility];  Service: Cardiovascular;  Laterality: N/A;    There were no vitals filed for this visit.   Subjective Assessment - 11/20/21 0848     Subjective Pt reports she is trying towel desensitization which seems to be helping.    Pertinent History L BKA, R big toe amputation    Limitations Standing;Walking;House hold activities    Currently in Pain? No/denies                [facility] PT Assessment - 11/20/21 0911       Standardized Balance Assessment   10 Meter Walk 0.94 m/s              Theractivity: Practiced getting down to floor and back up with bil UE support on mat table and SBA without prosthesis: 2x, first 2 trials with verbal cues for hand placement and last trial without any cueing  Marching on trampoline with intermittent UE support as needed: 20x Calf stretch: 3 x 30" R only Tandem walk with one UE support: fwd and bwd: 3 x 10   SLS: back and forth for 5 sec for 5 min total, pt educated on reacting with her feet more instead of her hands and practicing this at work behind her desk.                       PT Short Term Goals - 11/20/21 0909       PT SHORT TERM GOAL #1   Title Patient will verbalize proper instructions for sweat management, sock management, residual limb and prosthetic leg care to improve functional independence with prosthetic leg.    Baseline Education initiated (10/07/21)    Time 4    Period Weeks    Status Achieved    Target Date 11/05/21      PT SHORT TERM GOAL #2   Title Patient will be able to go up and down curb step without AD and SBA to improve community access    Baseline not attempted; SBA/CGA with curb    Time 4    Period Weeks    Status Achieved    Target Date 11/05/21      PT SHORT TERM GOAL #3   Title Pt will be able to get up and down from floor with suport surface with supervision only.    Baseline not attempted; goal met 11/20/21    Time 4    Period Weeks    Status Achieved    Target Date 12/02/21               PT Long Term Goals - 11/20/21 0909       PT LONG TERM GOAL #1   Title Patient will demo 20/30 on FGA to improve functional gait and ambulation    Baseline 10/30 (10/07/21)    Time 8    Period Weeks    Status On-going    Target Date 12/30/21      PT LONG TERM GOAL #2   Title Pt will demo at least 0.10ms gait speed without AD to improve commnity ambulation    Baseline 0.5 m/s (10/07/21); 0.957m without AD (11/20/21)    Time 8    Period Weeks    Status  Achieved    Target Date 12/30/21  PT LONG TERM GOAL #3   Title Pt will be able to maintain SLS for 5 sec on L LE to improve balance with curb steps negotiations    Baseline Revised on 11/04/21; 5 sec on R and 2 sec on L (11/20/21)    Time 8    Period Weeks    Status New    Target Date 12/30/21                   Plan - 11/20/21 0909     Clinical Impression Statement Pt has met all of her short term goals. Met long term goal #2 today. Discharge planning discussed with patient to be discharged in 2 more sessions (session 12). Pt in agreement.    Personal Factors and Comorbidities Past/Current Experience;Comorbidity 2    Comorbidities L BKA, R big toe amputation    Examination-Activity Limitations Lift;Squat;Stairs;Stand    Examination-Participation Restrictions Cleaning;Community Activity;Occupation;Shop;Yard Work    Stability/Clinical Decision Making Stable/Uncomplicated    Rehab Potential Good    PT Frequency 2x / week    PT Duration 6 weeks    PT Treatment/Interventions ADLs/Self Care Home Management;Cryotherapy;Moist Heat;Biofeedback;Gait training;Stair training;Functional mobility training;Therapeutic activities;Therapeutic exercise;Balance training;Manual techniques;Prosthetic Training;Orthotic Fit/Training;Patient/family education;Neuromuscular re-education;Passive range of motion;Scar mobilization;Energy conservation;Joint Manipulations    PT Next Visit Plan Perform FGA, work on SLS, discharge at session 12 (already discussed with patient)    PT Home Exercise Plan Access Code [email or web address]    Consulted and Agree with Plan of Care Patient             Patient will benefit from skilled therapeutic intervention in order to improve the following deficits and impairments:  Abnormal gait, Decreased activity tolerance, Decreased balance, Decreased coordination, Decreased range of motion, Decreased mobility, Decreased endurance, Decreased skin integrity, Decreased strength,  Difficulty walking, Impaired flexibility, Increased edema, Impaired sensation, Postural dysfunction, Pain, Prosthetic Dependency  Visit Diagnosis: Other abnormalities of gait and mobility  Muscle weakness (generalized)  Left below-knee amputee [facility])     Problem List Patient Active Problem List   Diagnosis Date Noted   Homelessness 11/19/2021   Right great toe amputee ([place]) 11/03/2021   Class 2 severe obesity due to excess calories with serious comorbidity and body mass index (BMI) of 36.0 to 36.9 in adult ([place]) 10/15/2021   Insomnia 10/07/2021   Localized edema 10/07/2021   Left below-knee amputee ([place]) 07/01/2021   Normocytic anemia 06/26/2021   Heart failure with mid-range ejection fraction ([place]) 06/26/2021   Diabetic nephropathy with proteinuria ([place]) 06/26/2021   Diabetic foot ulcer ([place]) 05/26/2021   Dental caries 04/01/2020   Cracked tooth 04/01/2020   Alcohol use 03/11/2020   Vitamin D deficiency 03/11/2020   Prolonged QT interval 02/26/2020   Chronic combined systolic and diastolic CHF (congestive heart failure) ([place]) 02/26/2020   Heart block AV complete ([place]) 02/11/2020   Exocrine pancreatic insufficiency    Dysmenorrhea 10/02/2019   DM type 2 with diabetic peripheral neuropathy ([place]) 05/30/2019   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 block) 05/29/2019   Type 2 diabetes mellitus ([place]) 10/21/2016   Former smoker 11/26/2013   Hypercholesteremia 02/02/2007   Bipolar 1 disorder ([place]) 02/02/2007   HYPERTENSION, BENIGN SYSTEMIC 02/02/2007    Kerrie Pleasure, PT 11/20/2021, 9:32 AM  [facility] [address] [place], [place], [address] Phone: [phone]   Fax:  [phone]  Name: Sarah Long MRN: [id number] Date of Birth: [birth year]

## 2021-11-24 ENCOUNTER — Telehealth: Payer: Self-pay | Admitting: *Deleted

## 2021-11-24 NOTE — Telephone Encounter (Signed)
Call returned to patient. She inquired about additional resources for rent assistance.  Informed her that a request has been submitted ( to the [facility]) and this CM is waiting for a response.   She has information about the food pantry at [facility] but would like more information about food pantries in the area.  Explained to her that this CM will provide a list for her.  The patient was also in agreement to submitting a referral to [employer] for assistance with groceries.  The completed referral was then sent to the attention of Manuela Schwartz Fox/[employer].

## 2021-11-25 ENCOUNTER — Ambulatory Visit: Payer: Medicaid Other

## 2021-11-25 ENCOUNTER — Other Ambulatory Visit: Payer: Self-pay

## 2021-11-25 DIAGNOSIS — Z89512 Acquired absence of left leg below knee: Secondary | ICD-10-CM

## 2021-11-25 DIAGNOSIS — R2689 Other abnormalities of gait and mobility: Secondary | ICD-10-CM | POA: Diagnosis not present

## 2021-11-25 DIAGNOSIS — M6281 Muscle weakness (generalized): Secondary | ICD-10-CM

## 2021-11-25 NOTE — Therapy (Addendum)
[facility] [address] [place], [place], [address] Phone: [phone]   Fax:  [phone]  Physical Therapy Treatment  Patient Details  Name: Sarah Long MRN: [id number] Date of Birth: [birth year] Referring Provider (PT): Dr. Sharol Given   Encounter Date: 11/25/2021   PT End of Session - 11/25/21 0853     Visit Number 11    Number of Visits 21    Date for PT Re-Evaluation 12/30/21    Authorization Type [employer] effective 11/05/21    Authorization Time Period Will need to submit after initial 12 visits    Authorization - Visit Number 11    Authorization - Number of Visits 12    PT Start Time 0845    PT Stop Time 0930    PT Time Calculation (min) 45 min    Equipment Utilized During Treatment Gait belt    Activity Tolerance Patient tolerated treatment well;No increased pain    Behavior During Therapy [facility] for tasks assessed/performed             Past Medical History:  Diagnosis Date   Abnormal Pap smear 1998   Abnormal vaginal bleeding 12/19/2011   Acute urinary retention 06/26/2021   Arthritis    Bacterial infection    Bipolar 1 disorder (HCC)    Blister of second toe of left foot 11/21/2016   Candida vaginitis 07/2007   Depression    recently added wellbutrin-has not taken yet for bipolar   Diabetes in pregnancy    Diabetes mellitus    nph 20U qam and qpm, regular with meals   Diabetic ketoacidosis without coma associated with type 2 diabetes mellitus ([place])    Fibroid    Galactorrhea of right breast 2008   GERD (gastroesophageal reflux disease)    H/O amenorrhea 07/2007   H/O dizziness 10/14/2011   H/O dysmenorrhea 2010   H/O menorrhagia 10/14/2011   H/O varicella    Headache(784.0)    Heavy vaginal bleeding due to contraceptive injection use 10/12/2011   Depo Provera   Herpes    HSV-2 infection 01/03/2009   Hx: UTI (urinary tract infection) 2009   Hypertension    on aldomet   Hypoalbuminemia  due to protein-calorie malnutrition ([place]) 06/26/2021   Increased BMI 2010   Irregular uterine bleeding 04/04/2012   Pt has mirena    Obesity 10/14/2011   Oligomenorrhea 07/2007   Pelvic pain in female    Presence of permanent cardiac pacemaker    Preterm labor    Trichomonas    Yeast infection     Past Surgical History:  Procedure Laterality Date   ABDOMINAL AORTOGRAM W/LOWER EXTREMITY N/A 06/17/2021   Procedure: ABDOMINAL AORTOGRAM W/LOWER EXTREMITY;  Surgeon: Wellington Hampshire, MD;  Location: [place] [facility];  Service: Cardiovascular;  Laterality: N/A;   AMPUTATION Left 06/27/2021   Procedure: AMPUTATION BELOW KNEE;  Surgeon: Newt Minion, MD;  Location: [facility];  Service: Orthopedics;  Laterality: Left;   AMPUTATION Right 09/04/2021   Procedure: RIGHT GREAT TOE AMPUTATION;  Surgeon: Newt Minion, MD;  Location: [facility];  Service: Orthopedics;  Laterality: Right;   CESAREAN SECTION  1991   LEFT HEART CATH AND CORONARY ANGIOGRAPHY N/A 02/10/2020   Procedure: LEFT HEART CATH AND CORONARY ANGIOGRAPHY;  Surgeon: Troy Sine, MD;  Location: [facility];  Service: Cardiovascular;  Laterality: N/A;   PACEMAKER IMPLANT N/A 02/13/2020   Procedure: PACEMAKER IMPLANT;  Surgeon: Constance Haw, MD;  [facility]: [facility]  INVASIVE CV LAB;  Service: Cardiovascular;  Laterality: N/A;   TEMPORARY PACEMAKER N/A 02/10/2020   Procedure: TEMPORARY PACEMAKER;  Surgeon: Troy Sine, MD;  Location: [facility] [facility];  Service: Cardiovascular;  Laterality: N/A;    There were no vitals filed for this visit.   Subjective Assessment - 11/25/21 0854     Subjective Pt reports she tried taking the bath since last session.    Pertinent History L BKA, R big toe amputation    Limitations Standing;Walking;House hold activities    Currently in Pain? No/denies                [facility] PT Assessment - 11/26/21 0001       [place] residence    [place]  to enter    [place] Two level      Functional Gait  Assessment   Gait Level Surface Walks 20 ft, slow speed, abnormal gait pattern, evidence for imbalance or deviates 10-15 in outside of the 12 in walkway width. Requires more than 7 sec to ambulate 20 ft.    Change in Gait Speed Makes only minor adjustments to walking speed, or accomplishes a change in speed with significant gait deviations, deviates 10-15 in outside the 12 in walkway width, or changes speed but loses balance but is able to recover and continue walking.    Gait with Horizontal Head Turns Performs head turns with moderate changes in gait velocity, slows down, deviates 10-15 in outside 12 in walkway width but recovers, can continue to walk.    Gait with Vertical Head Turns Performs task with moderate change in gait velocity, slows down, deviates 10-15 in outside 12 in walkway width but recovers, can continue to walk.    Gait and Pivot Turn Turns slowly, requires verbal cueing, or requires several small steps to catch balance following turn and stop    Step Over Obstacle Is able to step over one shoe box (4.5 in total height) but must slow down and adjust steps to clear box safely. May require verbal cueing.    Gait with Narrow Base of Support Ambulates 4-7 steps.    Gait with Eyes Closed Walks 20 ft, slow speed, abnormal gait pattern, evidence for imbalance, deviates 10-15 in outside 12 in walkway width. Requires more than 9 sec to ambulate 20 ft.    Ambulating Backwards Walks 20 ft, slow speed, abnormal gait pattern, evidence for imbalance, deviates 10-15 in outside 12 in walkway width.    Steps Two feet to a stair, must use rail.    Total Score 10               FGA assessed SLS: 5 x 5" , cues regarding weight shifts before lifting the foot Tandem walking: fwd only: 3 x 10 feet Walking on grass: 4 x 20 feet with cues to look down.             [facility] [place] [facility] PT Assessment - 11/26/21 0001       [place] residence    [place] to enter    [place] Two level      Functional Gait  Assessment   Gait Level Surface Walks 20 ft, slow speed, abnormal gait pattern, evidence for imbalance or deviates 10-15 in outside of the 12 in walkway width. Requires more than 7 sec to ambulate 20 ft.    Change in Gait Speed Makes  only minor adjustments to walking speed, or accomplishes a change in speed with significant gait deviations, deviates 10-15 in outside the 12 in walkway width, or changes speed but loses balance but is able to recover and continue walking.    Gait with Horizontal Head Turns Performs head turns with moderate changes in gait velocity, slows down, deviates 10-15 in outside 12 in walkway width but recovers, can continue to walk.    Gait with Vertical Head Turns Performs task with moderate change in gait velocity, slows down, deviates 10-15 in outside 12 in walkway width but recovers, can continue to walk.    Gait and Pivot Turn Turns slowly, requires verbal cueing, or requires several small steps to catch balance following turn and stop    Step Over Obstacle Is able to step over one shoe box (4.5 in total height) but must slow down and adjust steps to clear box safely. May require verbal cueing.    Gait with Narrow Base of Support Ambulates 4-7 steps.    Gait with Eyes Closed Walks 20 ft, slow speed, abnormal gait pattern, evidence for imbalance, deviates 10-15 in outside 12 in walkway width. Requires more than 9 sec to ambulate 20 ft.    Ambulating Backwards Walks 20 ft, slow speed, abnormal gait pattern, evidence for imbalance, deviates 10-15 in outside 12 in walkway width.    Steps Two feet to a stair, must use rail.    Total Score 10                        PT Short Term Goals - 11/20/21 0909       PT SHORT TERM GOAL #1   Title Patient will verbalize proper instructions for sweat management, sock management, residual limb and prosthetic  leg care to improve functional independence with prosthetic leg.    Baseline Education initiated (10/07/21)    Time 4    Period Weeks    Status Achieved    Target Date 11/05/21      PT SHORT TERM GOAL #2   Title Patient will be able to go up and down curb step without AD and SBA to improve community access    Baseline not attempted; SBA/CGA with curb    Time 4    Period Weeks    Status Achieved    Target Date 11/05/21      PT SHORT TERM GOAL #3   Title Pt will be able to get up and down from floor with suport surface with supervision only.    Baseline not attempted; goal met 11/20/21    Time 4    Period Weeks    Status Achieved    Target Date 12/02/21               PT Long Term Goals - 11/26/21 0856       PT LONG TERM GOAL #1   Title Patient will demo 20/30 on FGA to improve functional gait and ambulation    Baseline 25/30 (11/25/21)    Status Achieved      PT LONG TERM GOAL #2   Title Pt will demo at least 0.56ms gait speed without AD to improve commnity ambulation    Baseline 0.924m without AD (11/20/21)    Status Achieved    Target Date 12/30/21      PT LONG TERM GOAL #3   Title Pt will be able to maintain SLS for 5 sec on L LE to improve  balance with curb steps negotiations    Status New                   Plan - 11/25/21 7342     Clinical Impression Statement Pt met LTG #1 today. Pt made a significant gain in her functional gait assessment score.    Personal Factors and Comorbidities Past/Current Experience;Comorbidity 2    Comorbidities L BKA, R big toe amputation    Examination-Activity Limitations Lift;Squat;Stairs;Stand    Examination-Participation Restrictions Cleaning;Community Activity;Occupation;Shop;Yard Work    Stability/Clinical Decision Making Stable/Uncomplicated    Rehab Potential Good    PT Frequency 2x / week    PT Duration 6 weeks    PT Treatment/Interventions ADLs/Self Care Home Management;Cryotherapy;Moist  Heat;Biofeedback;Gait training;Stair training;Functional mobility training;Therapeutic activities;Therapeutic exercise;Balance training;Manual techniques;Prosthetic Training;Orthotic Fit/Training;Patient/family education;Neuromuscular re-education;Passive range of motion;Scar mobilization;Energy conservation;Joint Manipulations    PT Next Visit Plan Discharge next session; assess SLS goal, Practice getting on and off floor with and wthout prosthesis.    PT Home Exercise Plan Access Code [email or web address]    Consulted and Agree with Plan of Care Patient             Patient will benefit from skilled therapeutic intervention in order to improve the following deficits and impairments:  Abnormal gait, Decreased activity tolerance, Decreased balance, Decreased coordination, Decreased range of motion, Decreased mobility, Decreased endurance, Decreased skin integrity, Decreased strength, Difficulty walking, Impaired flexibility, Increased edema, Impaired sensation, Postural dysfunction, Pain, Prosthetic Dependency  Visit Diagnosis: Other abnormalities of gait and mobility  Muscle weakness (generalized)  Left below-knee amputee [facility])     Problem List Patient Active Problem List   Diagnosis Date Noted   Homelessness 11/19/2021   Right great toe amputee ([place]) 11/03/2021   Class 2 severe obesity due to excess calories with serious comorbidity and body mass index (BMI) of 36.0 to 36.9 in adult [facility]) 10/15/2021   Insomnia 10/07/2021   Localized edema 10/07/2021   Left below-knee amputee ([place]) 07/01/2021   Normocytic anemia 06/26/2021   Heart failure with mid-range ejection fraction ([place]) 06/26/2021   Diabetic nephropathy with proteinuria ([place]) 06/26/2021   Diabetic foot ulcer ([place]) 05/26/2021   Dental caries 04/01/2020   Cracked tooth 04/01/2020   Alcohol use 03/11/2020   Vitamin D deficiency 03/11/2020   Prolonged QT interval 02/26/2020   Chronic combined systolic and diastolic CHF (congestive  heart failure) ([place]) 02/26/2020   Heart block AV complete ([place]) 02/11/2020   Exocrine pancreatic insufficiency    Dysmenorrhea 10/02/2019   DM type 2 with diabetic peripheral neuropathy ([place]) 05/30/2019   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 block) 05/29/2019   Type 2 diabetes mellitus ([place]) 10/21/2016   Former smoker 11/26/2013   Hypercholesteremia 02/02/2007   Bipolar 1 disorder ([place]) 02/02/2007   HYPERTENSION, BENIGN SYSTEMIC 02/02/2007    Kerrie Pleasure, PT 11/26/2021, 9:18 AM  [facility] [address] [place], [place], [address] Phone: [phone]   Fax:  [phone]  Name: DANELLY HASSINGER MRN: [id number] Date of Birth: [birth year]

## 2021-11-26 ENCOUNTER — Ambulatory Visit: Payer: Medicaid Other | Admitting: Physical Therapy

## 2021-11-26 ENCOUNTER — Encounter: Payer: Self-pay | Admitting: Physical Therapy

## 2021-11-26 DIAGNOSIS — M6281 Muscle weakness (generalized): Secondary | ICD-10-CM | POA: Diagnosis not present

## 2021-11-26 DIAGNOSIS — R2689 Other abnormalities of gait and mobility: Secondary | ICD-10-CM

## 2021-11-26 DIAGNOSIS — Z89512 Acquired absence of left leg below knee: Secondary | ICD-10-CM | POA: Diagnosis not present

## 2021-11-26 NOTE — Therapy (Signed)
[facility] [address] [place], [place], [address] Phone: [phone]   Fax:  [phone]  Physical Therapy Treatment  Patient Details  Name: Sarah Long MRN: [id number] Date of Birth: [birth year] Referring Provider (PT): Dr. Sharol Given   Encounter Date: 11/26/2021   PT End of Session - 11/26/21 0850     Visit Number 12    Number of Visits 21    Date for PT Re-Evaluation 12/30/21    Authorization Type [employer] effective 11/05/21    Authorization Time Period Will need to submit after initial 12 visits    Authorization - Visit Number 12    Authorization - Number of Visits 12    PT Start Time 0846    PT Stop Time 0926    PT Time Calculation (min) 40 min    Equipment Utilized During Treatment Gait belt    Activity Tolerance Patient tolerated treatment well;No increased pain    Behavior During Therapy [facility] for tasks assessed/performed             Past Medical History:  Diagnosis Date   Abnormal Pap smear 1998   Abnormal vaginal bleeding 12/19/2011   Acute urinary retention 06/26/2021   Arthritis    Bacterial infection    Bipolar 1 disorder (HCC)    Blister of second toe of left foot 11/21/2016   Candida vaginitis 07/2007   Depression    recently added wellbutrin-has not taken yet for bipolar   Diabetes in pregnancy    Diabetes mellitus    nph 20U qam and qpm, regular with meals   Diabetic ketoacidosis without coma associated with type 2 diabetes mellitus ([place])    Fibroid    Galactorrhea of right breast 2008   GERD (gastroesophageal reflux disease)    H/O amenorrhea 07/2007   H/O dizziness 10/14/2011   H/O dysmenorrhea 2010   H/O menorrhagia 10/14/2011   H/O varicella    Headache(784.0)    Heavy vaginal bleeding due to contraceptive injection use 10/12/2011   Depo Provera   Herpes    HSV-2 infection 01/03/2009   Hx: UTI (urinary tract infection) 2009   Hypertension    on aldomet   Hypoalbuminemia  due to protein-calorie malnutrition ([place]) 06/26/2021   Increased BMI 2010   Irregular uterine bleeding 04/04/2012   Pt has mirena    Obesity 10/14/2011   Oligomenorrhea 07/2007   Pelvic pain in female    Presence of permanent cardiac pacemaker    Preterm labor    Trichomonas    Yeast infection     Past Surgical History:  Procedure Laterality Date   ABDOMINAL AORTOGRAM W/LOWER EXTREMITY N/A 06/17/2021   Procedure: ABDOMINAL AORTOGRAM W/LOWER EXTREMITY;  Surgeon: Wellington Hampshire, MD;  Location: [facility];  Service: Cardiovascular;  Laterality: N/A;   AMPUTATION Left 06/27/2021   Procedure: AMPUTATION BELOW KNEE;  Surgeon: Newt Minion, MD;  Location: [facility];  Service: Orthopedics;  Laterality: Left;   AMPUTATION Right 09/04/2021   Procedure: RIGHT GREAT TOE AMPUTATION;  Surgeon: Newt Minion, MD;  Location: [facility];  Service: Orthopedics;  Laterality: Right;   CESAREAN SECTION  1991   LEFT HEART CATH AND CORONARY ANGIOGRAPHY N/A 02/10/2020   Procedure: LEFT HEART CATH AND CORONARY ANGIOGRAPHY;  Surgeon: Troy Sine, MD;  Location: [facility];  Service: Cardiovascular;  Laterality: N/A;   PACEMAKER IMPLANT N/A 02/13/2020   Procedure: PACEMAKER IMPLANT;  Surgeon: Constance Haw, MD;  [facility]: [facility]  IN[facility];  Service: Cardiovascular;  Laterality: N/A;   TEMPORARY PACEMAKER N/A 02/10/2020   Procedure: TEMPORARY PACEMAKER;  Surgeon: Troy Sine, MD;  Location: [facility];  Service: Cardiovascular;  Laterality: N/A;    There were no vitals filed for this visit.   Subjective Assessment - 11/26/21 0849     Subjective No new complaitns. No falls or pain to report.    Pertinent History L BKA, R big toe amputation    Limitations Standing;Walking;House hold activities    Currently in Pain? No/denies    Pain Score 0-No pain                  [facility] Adult PT Treatment/Exercise - 11/26/21 0857       Transfers   Transfers Sit to Stand;Stand to  Sit    Sit to Stand 5: Supervision;With upper extremity assist;From chair/3-in-1    Stand to Sit 5: Supervision;With upper extremity assist;To chair/3-in-1      Ambulation/Gait   Ambulation/Gait Yes    Ambulation/Gait Assistance 6: Modified independent (Device/Increase time)    Ambulation Distance (Feet) --   around clinic with session   Assistive device None;Prosthesis    Gait Pattern Step-through pattern;Decreased stride length;Decreased stance time - left;Decreased step length - right;Decreased weight shift to left;Decreased trunk rotation;Wide base of support    Ambulation Surface Level;Indoor      Self-Care   Self-Care Other Self-Care Comments    Other Self-Care Comments  unable to doff prosthesis to perform floor transfer without it due to clothing pt wore today. Verbally discussed ways to make getting into/out of tub safer. getting in prior to filling tub and draining tub prior to getting out. use of tub bench to lower into tub and for getting up from tub. Discussed wearing proshesis as much of it as possible when water not involved (to get into tub priot to filling with with water_. Use of grab bars for stability and non skid rubber mat in bottom of tub no not to slip when getting out. Pt verbalized understanding of all strategies discussed.      Exercises   Exercises Other Exercises    Other Exercises  reviewed HEP and added additional ex's for balance. no issues noted or reported in session. Refer to [place] for full details. Min guard assist for safety.      Prosthetics   Current prosthetic wear tolerance (days/week)  daily    Current prosthetic wear tolerance (#hours/day)  all awake hours with 1-2 short duration breaks (~15 minutes)    Residual limb condition  intact per pt report    Donning Prosthesis Modified independent (device/increased time)    Doffing Prosthesis Modified independent (device/increased time)                 Balance Exercises - 11/26/21 0922        Balance Exercises: Standing   Standing Eyes Closed Narrow base of support (BOS);Wide (BOA);Head turns;Foam/compliant surface;Other reps (comment);30 secs;Limitations    Standing Eyes Closed Limitations on airex in corner with no UE support: narrow base of support for EC 30 sec's x 3 reps, progressing to feet apart for EC head movements left<>right, up<>down for 8-10 reps each with min guard to min assist for balance. cues on posture/weight shifting to assist with balance recovery.    SLS Eyes open;Solid surface;Upper extremity support 1;Intermittent upper extremity support;3 reps;Limitations    SLS Limitations 8-10 seconds with right stance, 2-3 seconds with left LE stance  PT Education - 11/26/21 [phone]     Education Details progress toward remaining LTG; updates to HEP; tub safety; plan for discharge    Person(s) Educated Patient    Methods Explanation;Demonstration;Verbal cues;Handout    Comprehension Verbalized understanding;Returned demonstration              PT Short Term Goals - 11/20/21 0909       PT SHORT TERM GOAL #1   Title Patient will verbalize proper instructions for sweat management, sock management, residual limb and prosthetic leg care to improve functional independence with prosthetic leg.    Baseline Education initiated (10/07/21)    Time 4    Period Weeks    Status Achieved    Target Date 11/05/21      PT SHORT TERM GOAL #2   Title Patient will be able to go up and down curb step without AD and SBA to improve community access    Baseline not attempted; SBA/CGA with curb    Time 4    Period Weeks    Status Achieved    Target Date 11/05/21      PT SHORT TERM GOAL #3   Title Pt will be able to get up and down from floor with suport surface with supervision only.    Baseline not attempted; goal met 11/20/21    Time 4    Period Weeks    Status Achieved    Target Date 12/02/21               PT Long Term Goals - 11/26/21 0856        PT LONG TERM GOAL #1   Title Patient will demo 20/30 on FGA to improve functional gait and ambulation    Baseline 25/30 (11/25/21)    Status Achieved      PT LONG TERM GOAL #2   Title Pt will demo at least 0.59m/s gait speed without AD to improve commnity ambulation    Baseline 0.37m/s without AD (11/20/21)    Status Achieved    Target Date 12/30/21      PT LONG TERM GOAL #3   Title Pt will be able to maintain SLS for 5 sec on L LE to improve balance with curb steps negotiations    Baseline 11/26/21: max time of 2-3 seconds in left single leg stance without support    Status Not Met                   Plan - 11/26/21 0850     Clinical Impression Statement Pt did not meet her final goal for single leg stance as she is still limited in this without UE support. HEP was modified to include addtional ex's for balance. All questions addressed with pt agreeable to discharge at this time.    Personal Factors and Comorbidities Past/Current Experience;Comorbidity 2    Comorbidities L BKA, R big toe amputation    Examination-Activity Limitations Lift;Squat;Stairs;Stand    Examination-Participation Restrictions Cleaning;Community Activity;Occupation;Shop;Yard Work    Stability/Clinical Decision Making Stable/Uncomplicated    Rehab Potential Good    PT Frequency 2x / week    PT Duration 6 weeks    PT Treatment/Interventions ADLs/Self Care Home Management;Cryotherapy;Moist Heat;Biofeedback;Gait training;Stair training;Functional mobility training;Therapeutic activities;Therapeutic exercise;Balance training;Manual techniques;Prosthetic Training;Orthotic Fit/Training;Patient/family education;Neuromuscular re-education;Passive range of motion;Scar mobilization;Energy conservation;Joint Manipulations    PT Next Visit Plan discharge this session    PT Home Exercise Plan Access Code [email or web address]    Consulted and Agree with Plan of Care  Patient             Patient will benefit from  skilled therapeutic intervention in order to improve the following deficits and impairments:  Abnormal gait, Decreased activity tolerance, Decreased balance, Decreased coordination, Decreased range of motion, Decreased mobility, Decreased endurance, Decreased skin integrity, Decreased strength, Difficulty walking, Impaired flexibility, Increased edema, Impaired sensation, Postural dysfunction, Pain, Prosthetic Dependency  Visit Diagnosis: Other abnormalities of gait and mobility  Muscle weakness (generalized)     Problem List Patient Active Problem List   Diagnosis Date Noted   Homelessness 11/19/2021   Right great toe amputee ([place]) 11/03/2021   Class 2 severe obesity due to excess calories with serious comorbidity and body mass index (BMI) of 36.0 to 36.9 in adult [facility]) 10/15/2021   Insomnia 10/07/2021   Localized edema 10/07/2021   Left below-knee amputee ([place]) 07/01/2021   Normocytic anemia 06/26/2021   Heart failure with mid-range ejection fraction ([place]) 06/26/2021   Diabetic nephropathy with proteinuria ([place]) 06/26/2021   Diabetic foot ulcer ([place]) 05/26/2021   Dental caries 04/01/2020   Cracked tooth 04/01/2020   Alcohol use 03/11/2020   Vitamin D deficiency 03/11/2020   Prolonged QT interval 02/26/2020   Chronic combined systolic and diastolic CHF (congestive heart failure) ([place]) 02/26/2020   Heart block AV complete ([place]) 02/11/2020   Exocrine pancreatic insufficiency    Dysmenorrhea 10/02/2019   DM type 2 with diabetic peripheral neuropathy ([place]) 05/30/2019   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 block) 05/29/2019   Type 2 diabetes mellitus ([place]) 10/21/2016   Former smoker 11/26/2013   Hypercholesteremia 02/02/2007   Bipolar 1 disorder ([place]) 02/02/2007   HYPERTENSION, BENIGN SYSTEMIC 02/02/2007    Willow Ora, PTA, [facility] [address] [place], [place] [address] [phone] 11/26/21, 10:44 AM   Name:  Sarah Long MRN: [id number] Date of Birth: October 04, 1971

## 2021-11-26 NOTE — Patient Instructions (Signed)
Access Code: [email or web address] URL: [email or web address] Date: 11/26/2021 Prepared by: Willow Ora  Exercises Single Leg Stance - 1-2 x daily - 5 x weekly - 5 reps - 5-10 sec hold Backward Walking with Counter Support - 1 x daily - 5 x weekly - 1 sets - 3 reps Standing Tandem Balance with Counter Support - 1 x daily - 5 x weekly - 1 sets - 3 reps - 10 seconds hold

## 2021-11-26 NOTE — Therapy (Signed)
[facility] [address] [place], [place], [address] Phone: [phone]   Fax:  [phone]  Patient Details  Name: KEYSHA DAMEWOOD MRN: [id number] Date of Birth: May 15, 1971 Referring Provider:  [name], MD  Encounter Date: 11/25/2021   Kerrie Pleasure, PT 11/26/2021, 10:05 AM  [facility] [address] [place], [place], [address] Phone: [phone]   Fax:  [phone]

## 2021-12-01 ENCOUNTER — Ambulatory Visit: Payer: Medicaid Other | Admitting: Physical Therapy

## 2021-12-01 ENCOUNTER — Telehealth: Payer: Self-pay

## 2021-12-01 NOTE — Telephone Encounter (Signed)
Call placed to patient and informed her that approval has been received from [facility] to pay $700 for one month's rent.  Explained to her that this CM will need to contact the motel manager to make the payment. She was very appreciative and said she would call this CM back with the contact information for the Field seismologist.

## 2021-12-02 ENCOUNTER — Telehealth: Payer: Self-pay | Admitting: Critical Care Medicine

## 2021-12-02 NOTE — Telephone Encounter (Signed)
This information was shared with the [facility]. A representative from the [facility] will contact the Field seismologist and make the payment.

## 2021-12-02 NOTE — Telephone Encounter (Signed)
Copied from [place] [id number]. Topic: General - Other >> Dec 02, 2021 11:12 AM Tessa Lerner A wrote: Reason for CRM: The patient has returned a call from Opal [place]les to provide the contact information of the hotel they're currently staying   Manager April Hotel: [employer]  Phone: [phone]  Please contact further if needed

## 2021-12-03 ENCOUNTER — Ambulatory Visit: Payer: Medicaid Other | Admitting: Physical Therapy

## 2021-12-08 ENCOUNTER — Ambulatory Visit: Payer: Medicaid Other | Admitting: Physical Therapy

## 2021-12-08 ENCOUNTER — Telehealth: Payer: Self-pay | Admitting: Critical Care Medicine

## 2021-12-08 NOTE — Telephone Encounter (Signed)
Call returned to patient # [phone].  Message left with call back requested to this CM

## 2021-12-08 NOTE — Telephone Encounter (Signed)
Copied from [place] [phone]. Topic: General - Other >> Dec 08, 2021 10:15 AM Tessa Lerner A wrote: Reason for CRM: The patient would like to be contacted by Etter Sjogren about information that was passed to the [facility]  The patient has called for an update on previously discussed resources/assistance   Please contact further when available

## 2021-12-15 ENCOUNTER — Encounter: Payer: PRIVATE HEALTH INSURANCE | Admitting: Physical Therapy

## 2021-12-15 ENCOUNTER — Other Ambulatory Visit: Payer: Self-pay

## 2021-12-15 ENCOUNTER — Ambulatory Visit
Admission: EM | Admit: 2021-12-15 | Discharge: 2021-12-15 | Disposition: A | Discharge status: Home / Self Care | Payer: Medicaid Other | Attending: Internal Medicine | Admitting: Internal Medicine

## 2021-12-15 ENCOUNTER — Encounter: Payer: Self-pay | Admitting: Emergency Medicine

## 2021-12-15 DIAGNOSIS — N3001 Acute cystitis with hematuria: Secondary | ICD-10-CM | POA: Insufficient documentation

## 2021-12-15 DIAGNOSIS — N898 Other specified noninflammatory disorders of vagina: Secondary | ICD-10-CM | POA: Diagnosis not present

## 2021-12-15 LAB — POCT URINALYSIS DIP (MANUAL ENTRY)
Bilirubin, UA: NEGATIVE
Glucose, UA: NEGATIVE mg/dL
Ketones, POC UA: NEGATIVE mg/dL
Nitrite, UA: NEGATIVE
Protein Ur, POC: >=300 mg/dL — AB
Spec Grav, UA: 1.02 (ref 1.010–1.025)
Urobilinogen, UA: 0.2 E.U./dL
pH, UA: 5.5 (ref 5.0–8.0)

## 2021-12-15 MED ORDER — CLINDAMYCIN HCL 150 MG PO CAPS: 300.0000 mg | ORAL_CAPSULE | Freq: Two times a day (BID) | ORAL | 0 refills | Status: DC

## 2021-12-15 MED ORDER — NITROFURANTOIN MONOHYD MACRO 100 MG PO CAPS: 100.0000 mg | ORAL_CAPSULE | Freq: Two times a day (BID) | ORAL | 0 refills | Status: DC

## 2021-12-15 NOTE — ED Triage Notes (Signed)
Patient c/o possible UTI, urinary frequency, cloudy and foul urine x 1 week.  Afebrile.

## 2021-12-15 NOTE — Discharge Instructions (Addendum)
You have a urinary tract infection which is being treated with Macrobid antibiotic.  Urine culture and vaginal swab are pending.  We will call if they are positive and treat as appropriate.

## 2021-12-15 NOTE — ED Provider Notes (Signed)
[facility]    CSN: [id number] Arrival date & time: 12/15/21  0816      History   Chief Complaint Chief Complaint  Patient presents with   Possible UTI    HPI Sarah Long is a 51 y.o. female.   Patient presents for concerns of urinary tract infection as she has had 1 week history of urinary frequency, cloudy urine, malodorous urine.  She also reports that she has had some white vaginal discharge as well.  Patient reports frequent urinary tract infections with the last being approximately 3 months ago.  Denies urinary burning, hematuria, irregular vaginal bleeding, pelvic pain, abdominal pain, fever, back pain.  Denies any known exposure to STD and has not had unprotected sexual intercourse recently.    Past Medical History:  Diagnosis Date   Abnormal Pap smear 1998   Abnormal vaginal bleeding 12/19/2011   Acute urinary retention 06/26/2021   Arthritis    Bacterial infection    Bipolar 1 disorder (HCC)    Blister of second toe of left foot 11/21/2016   Candida vaginitis 07/2007   Depression    recently added wellbutrin-has not taken yet for bipolar   Diabetes in pregnancy    Diabetes mellitus    nph 20U qam and qpm, regular with meals   Diabetic ketoacidosis without coma associated with type 2 diabetes mellitus ([place])    Fibroid    Galactorrhea of right breast 2008   GERD (gastroesophageal reflux disease)    H/O amenorrhea 07/2007   H/O dizziness 10/14/2011   H/O dysmenorrhea 2010   H/O menorrhagia 10/14/2011   H/O varicella    Headache(784.0)    Heavy vaginal bleeding due to contraceptive injection use 10/12/2011   Depo Provera   Herpes    HSV-2 infection 01/03/2009   Hx: UTI (urinary tract infection) 2009   Hypertension    on aldomet   Hypoalbuminemia due to protein-calorie malnutrition ([place]) 06/26/2021   Increased BMI 2010   Irregular uterine bleeding 04/04/2012   Pt has mirena    Obesity 10/14/2011   Oligomenorrhea 07/2007   Pelvic pain in  female    Presence of permanent cardiac pacemaker    Preterm labor    Trichomonas    Yeast infection     Patient Active Problem List   Diagnosis Date Noted   Homelessness 11/19/2021   Right great toe amputee ([place]) 11/03/2021   Class 2 severe obesity due to excess calories with serious comorbidity and body mass index (BMI) of 36.0 to 36.9 in adult [facility]) 10/15/2021   Insomnia 10/07/2021   Localized edema 10/07/2021   Left below-knee amputee ([place]) 07/01/2021   Normocytic anemia 06/26/2021   Heart failure with mid-range ejection fraction ([place]) 06/26/2021   Diabetic nephropathy with proteinuria ([place]) 06/26/2021   Diabetic foot ulcer ([place]) 05/26/2021   Dental caries 04/01/2020   Cracked tooth 04/01/2020   Alcohol use 03/11/2020   Vitamin D deficiency 03/11/2020   Prolonged QT interval 02/26/2020   Chronic combined systolic and diastolic CHF (congestive heart failure) ([place]) 02/26/2020   Heart block AV complete ([place]) 02/11/2020   Exocrine pancreatic insufficiency    Dysmenorrhea 10/02/2019   DM type 2 with diabetic peripheral neuropathy ([place]) 05/30/2019   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 block) 05/29/2019   Type 2 diabetes mellitus ([place]) 10/21/2016   Former smoker 11/26/2013   Hypercholesteremia 02/02/2007   Bipolar 1 disorder ([place]) 02/02/2007   HYPERTENSION, BENIGN SYSTEMIC 02/02/2007    Past  Surgical History:  Procedure Laterality Date   ABDOMINAL AORTOGRAM W/LOWER EXTREMITY N/A 06/17/2021   Procedure: ABDOMINAL AORTOGRAM W/LOWER EXTREMITY;  Surgeon: Wellington Hampshire, MD;  Location: [facility];  Service: Cardiovascular;  Laterality: N/A;   AMPUTATION Left 06/27/2021   Procedure: AMPUTATION BELOW KNEE;  Surgeon: Newt Minion, MD;  Location: [facility];  Service: Orthopedics;  Laterality: Left;   AMPUTATION Right 09/04/2021   Procedure: RIGHT GREAT TOE AMPUTATION;  Surgeon: Newt Minion, MD;  Location: [facility];  Service: Orthopedics;  Laterality:  Right;   CESAREAN SECTION  1991   LEFT HEART CATH AND CORONARY ANGIOGRAPHY N/A 02/10/2020   Procedure: LEFT HEART CATH AND CORONARY ANGIOGRAPHY;  Surgeon: Troy Sine, MD;  Location: [facility];  Service: Cardiovascular;  Laterality: N/A;   PACEMAKER IMPLANT N/A 02/13/2020   Procedure: PACEMAKER IMPLANT;  Surgeon: Constance Haw, MD;  Location: [facility];  Service: Cardiovascular;  Laterality: N/A;   TEMPORARY PACEMAKER N/A 02/10/2020   Procedure: TEMPORARY PACEMAKER;  Surgeon: Troy Sine, MD;  Location: [facility];  Service: Cardiovascular;  Laterality: N/A;    OB History     Gravida  6   Para  4   Term  1   Preterm  3   AB  2   Living  4      SAB  1   IAB  1   Ectopic  0   Multiple  0   Live Births  3            Home Medications    Prior to Admission medications   Medication Sig Start Date End Date Taking? Authorizing Provider  Accu-Chek Softclix Lancets lancets Use as directed up to 4 times daily 07/15/21  Yes Love, Ivan Anchors, PA-C  acetaminophen (TYLENOL) 325 MG tablet Take 650 mg by mouth every 6 (six) hours as needed.   Yes [provider]  atorvastatin (LIPITOR) 20 MG tablet Take 1 tablet (20 mg total) by mouth daily. 11/18/21  Yes [name], MD  Blood Glucose Monitoring Suppl (ACCU-CHEK GUIDE) w/Device KIT Use as directed 07/15/21  Yes Love, Ivan Anchors, PA-C  carvedilol (COREG) 6.25 MG tablet Take 1 tablet (6.25 mg total) by mouth 2 (two) times daily with a meal. 11/18/21  Yes [name], MD  chlorhexidine (PERIDEX) 0.12 % solution Use as directed 15 mLs in the mouth or throat 2 (two) times daily. 11/18/21  Yes [name], MD  Continuous Blood Gluc Receiver (DEXCOM G6 RECEIVER) DEVI Check sugar daily 11/06/21  Yes Shamleffer, Melanie Crazier, MD  Continuous Blood Gluc Sensor (DEXCOM G6 SENSOR) MISC 1 Device by Does not apply route as directed. 11/02/21  Yes Shamleffer, Melanie Crazier, MD   Continuous Blood Gluc Transmit (DEXCOM G6 TRANSMITTER) MISC 1 Device by Does not apply route as directed. 11/02/21  Yes Shamleffer, Melanie Crazier, MD  insulin glargine (LANTUS SOLOSTAR) 100 UNIT/ML Solostar Pen Inject 35 Units into the skin daily. 11/02/21  Yes Shamleffer, Melanie Crazier, MD  insulin lispro (HUMALOG) 100 UNIT/ML KwikPen Inject 10 Units into the skin 3 (three) times daily before meals. 11/02/21  Yes Shamleffer, Melanie Crazier, MD  Insulin Pen Needle 31G X 5 MM MISC Use as directed in the morning, at noon, in the evening, and at bedtime. 11/02/21  Yes Shamleffer, Melanie Crazier, MD  losartan (COZAAR) 25 MG tablet Take 1 tablet (25 mg total) by mouth daily. 11/18/21 02/16/22 Yes Charlcie Cradle,  Brien Few, PA-C  Multiple Vitamins-Minerals (ADULT ONE DAILY GUMMIES PO) Take 1 capsule by mouth daily.   Yes [provider]  nitrofurantoin, macrocrystal-monohydrate, (MACROBID) 100 MG capsule Take 1 capsule (100 mg total) by mouth 2 (two) times daily. 12/15/21  Yes , Hildred Alamin E, FNP  amoxicillin-clavulanate (AUGMENTIN) 875-125 MG tablet Take 1 tablet by mouth 2 (two) times daily. 11/18/21   [name], MD    Family History Family History  Problem Relation Age of Onset   Hypertension Mother    Diabetes Mother    Heart disease Mother    Hypertension Father    Diabetes Father    Heart disease Father    Stroke Maternal Grandfather    Other Neg Hx     Social History Social History   Tobacco Use   Smoking status: Former    Packs/day: 0.50    Years: 20.00    Pack years: 10.00    Types: Cigarettes    Quit date: 07/01/2021    Years since quitting: 0.4   Smokeless tobacco: Never  Vaping Use   Vaping Use: Never used  Substance Use Topics   Alcohol use: No   Drug use: No     Allergies   Strawberry extract and Sulfa antibiotics   Review of Systems Review of Systems Per HPI  Physical Exam Triage Vital Signs ED Triage Vitals  Enc Vitals Group     BP  12/15/21 0843 (!) 145/74     Pulse Rate 12/15/21 0843 86     Resp 12/15/21 0843 20     Temp 12/15/21 0843 97.8 F (36.6 C)     Temp Source 12/15/21 0843 Oral     SpO2 12/15/21 0843 99 %     Weight 12/15/21 0845 210 lb (95.3 kg)     Height 12/15/21 0845 $RemoveBefor'5\' 4"'mgrQEjLLhsnx$  (1.626 m)     Head Circumference --      Peak Flow --      Pain Score 12/15/21 0845 6     Pain Loc --      Pain Edu? --      Excl. in [place]? --    No data found.  Updated Vital Signs BP (!) 145/74 (BP Location: Right Arm)    Pulse 86    Temp 97.8 F (36.6 C) (Oral)    Resp 20    Ht $R'5\' 4"'qt$  (1.626 m)    Wt 210 lb (95.3 kg)    LMP 11/27/2021    SpO2 99%    BMI 36.05 kg/m   Visual Acuity Right Eye Distance:   Left Eye Distance:   Bilateral Distance:    Right Eye Near:   Left Eye Near:    Bilateral Near:     Physical Exam Constitutional:      General: She is not in acute distress.    Appearance: Normal appearance. She is not toxic-appearing or diaphoretic.  HENT:     Head: Normocephalic and atraumatic.  Eyes:     Extraocular Movements: Extraocular movements intact.     Conjunctiva/sclera: Conjunctivae normal.  Cardiovascular:     Rate and Rhythm: Normal rate and regular rhythm.     Pulses: Normal pulses.     Heart sounds: Normal heart sounds.  Pulmonary:     Effort: Pulmonary effort is normal. No respiratory distress.     Breath sounds: Normal breath sounds.  Abdominal:     General: Bowel sounds are normal. There is no distension.     Palpations:  Abdomen is soft.     Tenderness: There is no abdominal tenderness.  Genitourinary:    Comments: Deferred with shared decision making.  Self swab performed. Skin:    General: Skin is warm and dry.  Neurological:     General: No focal deficit present.     Mental Status: She is alert and oriented to person, place, and time. Mental status is at baseline.  Psychiatric:        Mood and Affect: Mood normal.        Behavior: Behavior normal.        Thought Content: Thought  content normal.        Judgment: Judgment normal.     UC Treatments / Results  Labs (all labs ordered are listed, but only abnormal results are displayed) Labs Reviewed  POCT URINALYSIS DIP (MANUAL ENTRY) - Abnormal; Notable for the following components:      Result Value   Clarity, UA cloudy (*)    Blood, UA moderate (*)    Protein Ur, POC >=300 (*)    Leukocytes, UA Large (3+) (*)    All other components within normal limits  URINE CULTURE  CERVICOVAGINAL ANCILLARY ONLY    EKG   Radiology No results found.  Procedures Procedures (including critical care time)  Medications Ordered in UC Medications - No data to display  Initial Impression / Assessment and Plan / UC Course  I have reviewed the triage vital signs and the nursing notes.  Pertinent labs & imaging results that were available during my care of the patient were reviewed by me and considered in my medical decision making (see chart for details).     Urinalysis indicating urinary tract infection.  Will treat with Macrobid x5 days.  Urine culture is pending. Will send cervicovaginal swab given that patient also has vaginal discharge.  Will await results and change or add treatment if appropriate.  Discussed strict return precautions.  Patient verbalized understanding and was agreeable with plan. Final Clinical Impressions(s) / UC Diagnoses   Final diagnoses:  Acute cystitis with hematuria  Vaginal discharge     Discharge Instructions      You have a urinary tract infection which is being treated with Macrobid antibiotic.  Urine culture and vaginal swab are pending.  We will call if they are positive and treat as appropriate.    ED Prescriptions     Medication Sig Dispense Auth. Provider   nitrofurantoin, macrocrystal-monohydrate, (MACROBID) 100 MG capsule Take 1 capsule (100 mg total) by mouth 2 (two) times daily. 10 capsule [place], [place] E, [place]   clindamycin (CLEOCIN) 150 MG capsule  (Status:  Discontinued) Take 2 capsules (300 mg total) by mouth in the morning and at bedtime for 7 days. 28 capsule [place], Michele Rockers, [place]      PDMP not reviewed this encounter.   Teodora Medici, [place] 12/15/21 [phone]

## 2021-12-16 ENCOUNTER — Telehealth (HOSPITAL_COMMUNITY): Payer: Self-pay | Admitting: Emergency Medicine

## 2021-12-16 LAB — CERVICOVAGINAL ANCILLARY ONLY
Bacterial Vaginitis (gardnerella): POSITIVE — AB
Candida Glabrata: NEGATIVE
Candida Vaginitis: NEGATIVE
Comment: NEGATIVE
Comment: NEGATIVE
Comment: NEGATIVE

## 2021-12-16 MED ORDER — METRONIDAZOLE 500 MG PO TABS: 500.0000 mg | ORAL_TABLET | Freq: Two times a day (BID) | ORAL | 0 refills | Status: DC

## 2021-12-17 LAB — URINE CULTURE: Culture: >=100000 — AB

## 2021-12-21 NOTE — Telephone Encounter (Signed)
Call placed to patient to inquire about her housing plans going forward.  She said that she has continued to inquire about assistance from community agencies as well as consider the option of moving to another city.  She stated that she has family/friends in [place], [place] and that may be an option for her. [place] shelters may also be an option if she needs to leave her current room. She and her 3 children - 16/12/49 yo are all in one room with a king size bed.   She is currently working with [employer] to submit a disability application to [place]. She is not working but has contacted [employer] for possible job opportunities.    Regarding housing, she has also contacted [employer], [employer], [employer], [employer] and the [employer].  She is either waiting for a call back from the agency or was told that they don't have anything to offer her.  She said that they are living week to week in the motel with support from her church as well as her son's survivor benefits.  She stated that she lost her section 8 voucher and will need to re-apply.    She was in agreement to having this CM submit a referral to [facility] for possible stay at [facility] if she looses her room.  She said she has been homeless, living in shelters, motels  for 2.5 years and is in need of permanent housing.  Referral for [facility] submitted to [employer] team, [facility].   Instructed patient to contact her insurance company to inquire if they offer any program for rent/ housing assistance.

## 2021-12-21 NOTE — Progress Notes (Signed)
Cardiology Office Note Date:  12/28/2021  Patient ID:  Sarah Long, Sarah Long 02-Sep-1971, MRN [id number] PCP:  [name], MD  Cardiologist/Vascular: Dr. Fletcher Anon Electrophysiologist: Dr. Curt Bears     Chief Complaint:  planned f/u  History of Present Illness: Sarah Long is a 51 y.o. female with history of DM, HTN, s/p L BKA July 2022 2/2 non-healing wound toes w/OM, HLD, smoker, CHB w/PPM  She had a cath  March 2021 showed normal coronary arteries.  Struggled with non-healing wounds L toes, angiography was performed in July of this year which showed no significant peripheral arterial disease.  She had normal flow to the toes, though required left BKA 2/2 OM Has also had subsequent right great toe amputated  She comes in today to be seen for Dr. Curt Bears, last seen by him June 2021, at that time, no cardiac concerns, device functioning well, no changes were made.  Most recently she saw Dr. Fletcher Anon, she had no evidence of PVD by angiography and wound issues likely 2/2 poorly controlled DM, planned to see him PRN  I saw her 10/22/21 She has her prosthetic now and over the last couple months much more active physically then she had been for the months prior. She has noticed that when active she is winded, suspects 2/2 deconditioning though in the last 3 weeks or so also noted that her RLE and LLE stump/gets swollen She was started on lasix about 2 weeks ago and has had increase in urine OP, but not noted too much improvement in the edema No rest or positional SOB No near syncope or syncope No CP Lasix increased, planned to update her echo and add ARB pending the echo  LVEF 70-75%, no WMA,  severe conc LVH (prior echo in 2021 was 40-45%) ?eval for amyloid  TODAY She is doing well, graduated PT with her prosthetic and getting around very well with it. Edema is better, her orthopedic stopped her lasix and advised her to wear compression stocking on her RLE and reminded her important to  wear her shrinker/stocking on her stump at night. She does though have ome symptoms of feeling it harder to breath when supine, intermittently having to use extra pillows to feel easier. No DOE, no CP  She does think her nocturnal SOB was better with the lasix  She mentions she ha had recurrent UTI's, asks about a referral to a specialist I have recommended that she follow up with her PMD n the [facility] UA/cultures and so on and any urological referral they feel in needed  Device information SJM dual chamber PPM implanted 02/13/2020   Past Medical History:  Diagnosis Date   Abnormal Pap smear 1998   Abnormal vaginal bleeding 12/19/2011   Acute urinary retention 06/26/2021   Arthritis    Bacterial infection    Bipolar 1 disorder ([place])    Blister of second toe of left foot 11/21/2016   Candida vaginitis 07/2007   Depression    recently added wellbutrin-has not taken yet for bipolar   Diabetes in pregnancy    Diabetes mellitus    nph 20U qam and qpm, regular with meals   Diabetic ketoacidosis without coma associated with type 2 diabetes mellitus ([place])    Fibroid    Galactorrhea of right breast 2008   GERD (gastroesophageal reflux disease)    H/O amenorrhea 07/2007   H/O dizziness 10/14/2011   H/O dysmenorrhea 2010   H/O menorrhagia 10/14/2011   H/O varicella  Headache(784.0)    Heavy vaginal bleeding due to contraceptive injection use 10/12/2011   Depo Provera   Herpes    HSV-2 infection 01/03/2009   Hx: UTI (urinary tract infection) 2009   Hypertension    on aldomet   Hypoalbuminemia due to protein-calorie malnutrition ([place]) 06/26/2021   Increased BMI 2010   Irregular uterine bleeding 04/04/2012   Pt has mirena    Obesity 10/14/2011   Oligomenorrhea 07/2007   Pelvic pain in female    Presence of permanent cardiac pacemaker    Preterm labor    Trichomonas    Yeast infection     Past Surgical History:  Procedure Laterality Date   ABDOMINAL AORTOGRAM W/LOWER  EXTREMITY N/A 06/17/2021   Procedure: ABDOMINAL AORTOGRAM W/LOWER EXTREMITY;  Surgeon: Wellington Hampshire, MD;  Location: [place] [facility];  Service: Cardiovascular;  Laterality: N/A;   AMPUTATION Left 06/27/2021   Procedure: AMPUTATION BELOW KNEE;  Surgeon: Newt Minion, MD;  Location: [place];  Service: Orthopedics;  Laterality: Left;   AMPUTATION Right 09/04/2021   Procedure: RIGHT GREAT TOE AMPUTATION;  Surgeon: Newt Minion, MD;  Location: [place];  Service: Orthopedics;  Laterality: Right;   CESAREAN SECTION  1991   LEFT HEART CATH AND CORONARY ANGIOGRAPHY N/A 02/10/2020   Procedure: LEFT HEART CATH AND CORONARY ANGIOGRAPHY;  Surgeon: Troy Sine, MD;  Location: [place] [facility];  Service: Cardiovascular;  Laterality: N/A;   PACEMAKER IMPLANT N/A 02/13/2020   Procedure: PACEMAKER IMPLANT;  Surgeon: Constance Haw, MD;  Location: [place] [facility];  Service: Cardiovascular;  Laterality: N/A;   TEMPORARY PACEMAKER N/A 02/10/2020   Procedure: TEMPORARY PACEMAKER;  Surgeon: Troy Sine, MD;  Location: [place] [facility];  Service: Cardiovascular;  Laterality: N/A;    Current Outpatient Medications  Medication Sig Dispense Refill   Accu-Chek Softclix Lancets lancets Use as directed up to 4 times daily 100 each 5   acetaminophen (TYLENOL) 325 MG tablet Take 650 mg by mouth every 6 (six) hours as needed.     atorvastatin (LIPITOR) 20 MG tablet Take 1 tablet (20 mg total) by mouth daily. 90 tablet 3   Blood Glucose Monitoring Suppl (ACCU-CHEK GUIDE) w/Device KIT Use as directed 1 kit 0   carvedilol (COREG) 6.25 MG tablet Take 1 tablet (6.25 mg total) by mouth 2 (two) times daily with a meal. 60 tablet 3   chlorhexidine (PERIDEX) 0.12 % solution Use as directed 15 mLs in the mouth or throat 2 (two) times daily. 473 mL 0   Continuous Blood Gluc Receiver (DEXCOM G6 RECEIVER) DEVI Check sugar daily 1 each 0   Continuous Blood Gluc Sensor (DEXCOM G6 SENSOR) MISC 1 Device by Does not apply  route as directed. 9 each 3   Continuous Blood Gluc Transmit (DEXCOM G6 TRANSMITTER) MISC 1 Device by Does not apply route as directed. 1 each 3   furosemide (LASIX) 20 MG tablet Take 1 tablet (20 mg total) by mouth daily. 90 tablet 3   insulin glargine (LANTUS SOLOSTAR) 100 UNIT/ML Solostar Pen Inject 35 Units into the skin daily. 30 mL 6   insulin lispro (HUMALOG) 100 UNIT/ML KwikPen Inject 10 Units into the skin 3 (three) times daily before meals. 30 mL 3   Insulin Pen Needle 31G X 5 MM MISC Use as directed in the morning, at noon, in the evening, and at bedtime. 400 each 3   losartan (COZAAR) 25 MG tablet Take 1 tablet (25 mg total)  by mouth daily. 30 tablet 6   Multiple Vitamins-Minerals (ADULT ONE DAILY GUMMIES PO) Take 1 capsule by mouth daily.     No current facility-administered medications for this visit.    Allergies:   Strawberry extract and Sulfa antibiotics   Social History:  The patient  reports that she quit smoking about 5 months ago. Her smoking use included cigarettes. She has a 10.00 pack-year smoking history. She has never used smokeless tobacco. She reports that she does not drink alcohol and does not use drugs.   Family History:  The patient's family history includes Diabetes in her father and mother; Heart disease in her father and mother; Hypertension in her father and mother; Stroke in her maternal grandfather.  ROS:  Please see the history of present illness.    All other systems are reviewed and otherwise negative.   PHYSICAL EXAM:  VS:  BP (!) 144/76    Ht $R'5\' 4"'jV$  (1.626 m)    Wt 209 lb 9.6 oz (95.1 kg)    BMI 35.98 kg/m  BMI: Body mass index is 35.98 kg/m. Well nourished, well developed, in no acute distress HEENT: normocephalic, atraumatic Neck: no JVD, carotid bruits or masses Cardiac:  RRR; no significant murmurs, no rubs, or gallops Lungs:  CTA b/l, no wheezing, rhonchi or rales Abd: soft, nontender MS: L BKA w/prosthetic Ext: no edema today Skin: warm  and dry, no rash Neuro:  No gross deficits appreciated Psych: euthymic mood, full affect  PPM site is stable, no tethering or discomfort   EKG:  not done today  Device interrogation done today and reviewed by myself:  Battery and lead testing is good RA lead cap confirm had threshold at 2V and outputs to 3.5V Cap confirm today in clinic 0.625V and manual is 0.75 HVR was old from April  AMS burden <1% EGMs reviewed, LONGEST episode back in Jul 2021, 12 seconds Some ar brief AT, others Aflutter/fib   11/17/21: TTE IMPRESSIONS   1. Left ventricular ejection fraction, by estimation, is 70 to 75%. Left  ventricular ejection fraction by PLAX is 74 %. The left ventricle has  hyperdynamic function. The left ventricle has no regional wall motion  abnormalities. There is severe  concentric left ventricular hypertrophy. Left ventricular diastolic  function could not be evaluated. The average left ventricular global  longitudinal strain is 21.9 %. The global longitudinal strain is normal.   2. Right ventricular systolic function is normal. The right ventricular  size is normal. There is mildly elevated pulmonary artery systolic  pressure. The estimated right ventricular systolic pressure is 54.0 mmHg.   3. Left atrial size was severely dilated.   4. The pericardial effusion is circumferential.   5. The mitral valve is abnormal. Mild to moderate mitral valve  regurgitation.   6. The aortic valve is tricuspid. Aortic valve regurgitation is not  visualized. Aortic valve mean gradient measures 4.0 mmHg.   7. The inferior vena cava is normal in size with greater than 50%  respiratory variability, suggesting right atrial pressure of 3 mmHg.   Comparison(s): Changes from prior study are noted. 02/10/2020: LVEF 40-45%.   Conclusion(s)/Recommendation(s): As previously mentioned, echo findings  suggestive of cardiac amyloidosis (LV wall thickness >1.9 cm, speckled,  trivial pericardial effusion)  - consider PYP scan and/or cMRI.     02/10/2020: LHC (temp pacing wire plcmt) Diabetic ketoacidosis with complete heart block with ventricular escape rhythm. Successful insertion of a temporary venous pacemaker advanced to the RV apex with  excellent capture.  Currently set at 80 bpm, and a 5. Normal coronary arteries. LVEDP 32 mmHg    02/10/2020: TTE IMPRESSIONS   1. Technically difficult; mild to moderate global reduction in LV  systolic function; severe LVH (myocardium with speckeld appearance;  suggest further evaluation for amyloid); mild MR.   2. Left ventricular ejection fraction, by estimation, is 40 to 45%. The  left ventricle has mildly decreased function. The left ventricle  demonstrates global hypokinesis. There is severe left ventricular  hypertrophy. Left ventricular diastolic parameters   are indeterminate.   3. Right ventricular systolic function is normal. The right ventricular  size is normal. There is normal pulmonary artery systolic pressure.   4. The mitral valve is normal in structure and function. Mild mitral  valve regurgitation. No evidence of mitral stenosis.   5. The aortic valve is tricuspid. Aortic valve regurgitation is not  visualized. No aortic stenosis is present.   6. The inferior vena cava is dilated in size with <50% respiratory  variability, suggesting right atrial pressure of 15 mmHg.   Recent Labs: 06/28/2021: Magnesium 1.7 09/04/2021: Hemoglobin 9.9; Platelets 224 10/07/2021: ALT 6 10/14/2021: BNP 285.1 10/22/2021: BUN 19; Creatinine, Ser 0.90; Potassium 4.8; Sodium 138  06/18/2021: Chol/HDL Ratio 2.6; Cholesterol, Total 98; HDL 37; LDL Chol Calc (NIH) 40; Triglycerides 117   CrCl cannot be calculated (Patient's most recent lab result is older than the maximum 21 days allowed.).   Wt Readings from Last 3 Encounters:  12/28/21 209 lb 9.6 oz (95.1 kg)  12/15/21 210 lb (95.3 kg)  11/18/21 215 lb 3.2 oz (97.6 kg)     Other studies  reviewed: Additional studies/records reviewed today include: summarized above  ASSESSMENT AND PLAN:  PPM Intact function No programming changes made She has a remote 01/08/22, monitor cap confirm on the A  HTN I asked her to monitor at home and let us know if >120/70 regularly  HLD Not addressed today   4. Edema 5. DOE No edema or DOE,  but she has some intermittent symptoms of orthopnea  Severe LVH on her echo (unable to assess diastolic function) I will resume low dose furosemide Echo reader recommends c.MRI to evaluate for amyloid, will plan for this, her device is compatible  6. SCAF Follow burden   Disposition: back in 46mo, sooner if needed  Current medicines are reviewed at length with the patient today.  The patient did not have any concerns regarding medicines.  Venetia Night, PA-C 12/28/2021 11:16 AM     [facility] [address] [phone] (office)  [phone] (fax)

## 2021-12-24 NOTE — Telephone Encounter (Signed)
Message received from [place] explaining that her program only provides housing for individuals and she will forward this request to her co-worker who assists families with housing.

## 2021-12-28 ENCOUNTER — Encounter: Payer: Self-pay | Admitting: Physician Assistant

## 2021-12-28 ENCOUNTER — Other Ambulatory Visit: Payer: Self-pay

## 2021-12-28 ENCOUNTER — Ambulatory Visit (INDEPENDENT_AMBULATORY_CARE_PROVIDER_SITE_OTHER): Payer: Medicaid Other | Admitting: Physician Assistant

## 2021-12-28 VITALS — BP 144/76 | Ht 64.0 in | Wt 209.6 lb

## 2021-12-28 DIAGNOSIS — I5042 Chronic combined systolic (congestive) and diastolic (congestive) heart failure: Secondary | ICD-10-CM

## 2021-12-28 DIAGNOSIS — Z950 Presence of cardiac pacemaker: Secondary | ICD-10-CM

## 2021-12-28 DIAGNOSIS — I43 Cardiomyopathy in diseases classified elsewhere: Secondary | ICD-10-CM | POA: Diagnosis not present

## 2021-12-28 DIAGNOSIS — E854 Organ-limited amyloidosis: Secondary | ICD-10-CM | POA: Diagnosis not present

## 2021-12-28 DIAGNOSIS — R931 Abnormal findings on diagnostic imaging of heart and coronary circulation: Secondary | ICD-10-CM

## 2021-12-28 DIAGNOSIS — Z79899 Other long term (current) drug therapy: Secondary | ICD-10-CM

## 2021-12-28 LAB — CUP PACEART INCLINIC DEVICE CHECK
Battery Remaining Longevity: 92 mo
Battery Voltage: 2.99 V
Brady Statistic RA Percent Paced: 0.3 %
Brady Statistic RV Percent Paced: 98 %
Date Time Interrogation Session: 20230123131110
Implantable Lead Implant Date: 20210310
Implantable Lead Implant Date: 20210310
Implantable Lead Location: 753859
Implantable Lead Location: 753860
Implantable Pulse Generator Implant Date: 20210310
Lead Channel Impedance Value: 400 Ohm
Lead Channel Impedance Value: 450 Ohm
Lead Channel Pacing Threshold Amplitude: 0.75 V
Lead Channel Pacing Threshold Amplitude: 0.75 V
Lead Channel Pacing Threshold Amplitude: 0.875 V
Lead Channel Pacing Threshold Pulse Width: 0.5 ms
Lead Channel Pacing Threshold Pulse Width: 0.5 ms
Lead Channel Pacing Threshold Pulse Width: 0.5 ms
Lead Channel Sensing Intrinsic Amplitude: 12 mV
Lead Channel Sensing Intrinsic Amplitude: 4.5 mV
Lead Channel Setting Pacing Amplitude: 1.125
Lead Channel Setting Pacing Amplitude: 1.625
Lead Channel Setting Pacing Pulse Width: 0.5 ms
Lead Channel Setting Sensing Sensitivity: 2 mV
Pulse Gen Model: 2272
Pulse Gen Serial Number: 3802095

## 2021-12-28 MED ORDER — FUROSEMIDE 20 MG PO TABS
20.0000 mg | ORAL_TABLET | Freq: Every day | ORAL | 3 refills | Status: DC
  Filled 2021-12-28: qty 90, 90d supply, fill #0

## 2021-12-28 NOTE — Patient Instructions (Addendum)
Medication Instructions:    START TAKING FUROSEMIDE 20 MG ONCE A DAY    *If you need a refill on your cardiac medications before your next appointment, please call your pharmacy*   Lab Work: RETURN FOR BMET IN 2 WEEKS   If you have labs (blood work) drawn today and your tests are completely normal, you will receive your results only by: [place] (if you have MyChart) OR A paper copy in the mail If you have any lab test that is abnormal or we need to change your treatment, we will call you to review the results.    Testing/Procedures: Your physician has requested that you have a cardiac MRI. Cardiac MRI uses a computer to create images of your heart as its beating, producing both still and moving pictures of your heart and major blood vessels. For further information please visit [email or web address] Please follow the instruction sheet given to you today for more information.     Follow-Up: At [facility], you and your health needs are our priority.  As part of our continuing mission to provide you with exceptional heart care, we have created designated Provider Care Teams.  These Care Teams include your primary Cardiologist (physician) and Advanced Practice Providers (APPs -  Physician Assistants and Nurse Practitioners) who all work together to provide you with the care you need, when you need it.  We recommend signing up for the patient portal called "MyChart".  Sign up information is provided on this After Visit Summary.  MyChart is used to connect with patients for Virtual Visits (Telemedicine).  Patients are able to view lab/test results, encounter notes, upcoming appointments, etc.  Non-urgent messages can be sent to your provider as well.   To learn more about what you can do with MyChart, go to [email or web address].    Your next appointment:   3 month(s)  The format for your next appointment:   In Person  Provider:   Allegra Lai, MD    Other Instructions

## 2021-12-31 NOTE — Telephone Encounter (Signed)
Attempted to contact patient # [phone] to inquire how she is doing with her housing search.  Message left with call back requested to this CM

## 2022-01-11 ENCOUNTER — Other Ambulatory Visit: Payer: Medicaid Other

## 2022-01-12 ENCOUNTER — Telehealth: Payer: Self-pay

## 2022-01-12 ENCOUNTER — Other Ambulatory Visit: Payer: Medicaid Other | Admitting: *Deleted

## 2022-01-12 ENCOUNTER — Other Ambulatory Visit: Payer: Self-pay

## 2022-01-12 DIAGNOSIS — Z79899 Other long term (current) drug therapy: Secondary | ICD-10-CM | POA: Diagnosis not present

## 2022-01-12 LAB — BASIC METABOLIC PANEL
BUN/Creatinine Ratio: 14 (ref 9–23)
BUN: 17 mg/dL (ref 6–24)
CO2: 23 mmol/L (ref 20–29)
Calcium: 8.9 mg/dL (ref 8.7–10.2)
Chloride: 107 mmol/L — ABNORMAL HIGH (ref 96–106)
Creatinine, Ser: 1.2 mg/dL — ABNORMAL HIGH (ref 0.57–1.00)
Glucose: 127 mg/dL — ABNORMAL HIGH (ref 70–99)
Potassium: 4.4 mmol/L (ref 3.5–5.2)
Sodium: 141 mmol/L (ref 134–144)
eGFR: 56 mL/min/{1.73_m2} — ABNORMAL LOW (ref >59)

## 2022-01-12 NOTE — Telephone Encounter (Signed)
Copied from [place] [phone]. Topic: General - Other >> Jan 12, 2022  8:59 AM Robina Ade, Helene Kelp D wrote: Reason for CRM: Patient called and stated that she had a missed call from [place] about f/u on her housing issues. She said if she could give her a call back [phone].

## 2022-01-13 NOTE — Telephone Encounter (Signed)
Call returned to patient. She explained that the conditions in the room she is staying in with her 3 children are deplorable- no heat, no working toilet.  She has asked management at the [facility] to address her concerns and nothing has been repaired. She said she has to use her [employer] and dump that outside and her children have to use the bathroom in the main lobby. There are roaches in the room and she has asked for double bed room because they currently only have 1 king size bed for the 4 of them.   She also explained that she had been paying $700/month for the room as an " employee discount" and the management just told her that she now needs to pay $300/week or $900/month and she continues to struggle with those payments.  She said that the management gave her no notice of the rent increase and told her that if she did not pay they would evict her.  She has been staying there for 2.5 years.  She was in agreement to sending a referral to [employer] [place] for assistance.  I explained that there is  no guarantee that they can help her but they may be able to refer her to an agency that can assist. She stated that she also has custody questions for Legal Aid. Referral then sent to Novella Olive, Oneta Rack Alethia Berthold

## 2022-01-14 ENCOUNTER — Telehealth: Payer: Self-pay | Admitting: *Deleted

## 2022-01-14 ENCOUNTER — Other Ambulatory Visit: Payer: Self-pay | Admitting: *Deleted

## 2022-01-14 DIAGNOSIS — Z79899 Other long term (current) drug therapy: Secondary | ICD-10-CM

## 2022-01-14 MED ORDER — FUROSEMIDE 20 MG PO TABS: 20.0000 mg | ORAL_TABLET | ORAL | 3 refills | Status: DC

## 2022-01-14 NOTE — Telephone Encounter (Signed)
-----   Message from [place], [place] sent at 01/12/2022  6:13 PM EST ----- Creat is up some, likely 2/2 the addition of the furosemide.  Have her reduce to 20mg  QOD and repeat in BMET in 2 weeks or so please.   His her breathing better at night?

## 2022-01-14 NOTE — Telephone Encounter (Signed)
Spoke with patient and aware of results and recommendations with verbalization of understanding. Patient return for labs on 01-28-22

## 2022-01-25 ENCOUNTER — Telehealth (HOSPITAL_COMMUNITY): Payer: Self-pay | Admitting: *Deleted

## 2022-01-25 ENCOUNTER — Other Ambulatory Visit: Payer: Self-pay

## 2022-01-25 ENCOUNTER — Ambulatory Visit (INDEPENDENT_AMBULATORY_CARE_PROVIDER_SITE_OTHER): Payer: Medicaid Other | Admitting: Orthopedic Surgery

## 2022-01-25 ENCOUNTER — Encounter: Payer: Self-pay | Admitting: Orthopedic Surgery

## 2022-01-25 DIAGNOSIS — Z89512 Acquired absence of left leg below knee: Secondary | ICD-10-CM | POA: Diagnosis not present

## 2022-01-25 DIAGNOSIS — M6701 Short Achilles tendon (acquired), right ankle: Secondary | ICD-10-CM

## 2022-01-25 DIAGNOSIS — Z89411 Acquired absence of right great toe: Secondary | ICD-10-CM

## 2022-01-25 NOTE — Telephone Encounter (Signed)
Attempted to call patient regarding upcoming cardiac MRI appointment. Left message on voicemail with name and callback number  Kinleigh Nault RN Navigator Cardiac Imaging [facility] [phone] Office [phone] Cell  

## 2022-01-25 NOTE — Telephone Encounter (Signed)
Reaching out to patient to offer assistance regarding upcoming cardiac imaging study; pt verbalizes understanding of appt date/time, parking situation and where to check in, and verified current allergies; name and call back number provided for further questions should they arise  Gordy Clement RN Navigator Cardiac Imaging [facility] [phone] office [phone] cell  Patient has a pacemaker but denies any other metal or claustrophobia.

## 2022-01-25 NOTE — Progress Notes (Signed)
Office Visit Note   [name]           Date of Birth: [birth year]           MRN: [id number] Visit Date: 01/25/2022              Requested by: [name], MD [address],  [place] [address] PCP: [name], MD  Chief Complaint  Patient presents with   Right Foot - Follow-up    09/04/2021 right GT amputation    Left Leg - Follow-up    06/27/21 left BKA       HPI: Patient is a 51 year old woman who is status post right great toe amputation and left transtibial amputation.  She states she has been working on Achilles stretching on the right side.  Patient complains of pain over the fibular head with using her prosthesis.  Assessment & Plan: Visit Diagnoses:  1. Left below-knee amputee ([place])   2. Right great toe amputee ([place])   3. Achilles tendon contracture, right     Plan: Patient was given a prescription for [employer] to see if padding around the fibular head will help unload this pressure she may need a new socket.  The importance of Achilles stretching on the right was discussed discussed she may need a gastrocnemius recession to prevent ulceration on the forefoot  Follow-Up Instructions: Return in about 3 months (around 04/24/2022).   Ortho Exam  Patient is alert, oriented, no adenopathy, well-dressed, normal affect, normal respiratory effort. Examination the right foot there is some swelling her incision is well-healed there is no signs of infection no ulcers.  She has a significant equinus contracture with dorsiflexion 30 degrees short of neutral with her knee extended.  Examination of the left leg she has a prominent fibular head which is tender to palpation there is no ulcers no cellulitis.  Imaging: No results found. No images are attached to the encounter.  Labs: Lab Results  Component Value Date   HGBA1C 7.4 (A) 10/14/2021   HGBA1C 13.2 (H) 07/02/2021   HGBA1C 13.6 (A) 06/18/2021   LABURIC 4.1 10/21/2016   LABURIC 4.2  11/20/2010   REPTSTATUS 12/17/2021 FINAL 12/15/2021   GRAMSTAIN NO WBC SEEN NO ORGANISMS SEEN  08/19/2021   CULT >=100,000 COLONIES/mL ESCHERICHIA COLI (A) 12/15/2021   LABORGA ESCHERICHIA COLI (A) 12/15/2021     Lab Results  Component Value Date   ALBUMIN 3.8 10/07/2021   ALBUMIN 3.4 (L) 07/30/2021   ALBUMIN 2.2 (L) 07/13/2021    Lab Results  Component Value Date   MG 1.7 06/28/2021   MG 1.8 06/27/2021   MG 1.7 06/27/2021   Lab Results  Component Value Date   VD25OH 24.1 (L) 03/11/2020   VD25OH <4.20 (L) 02/15/2020    No results found for: PREALBUMIN CBC EXTENDED Latest Ref Rng & Units 09/04/2021 07/30/2021 07/06/2021  WBC 4.0 - 10.5 K/uL 7.5 7.9 10.0  RBC 3.87 - 5.11 MIL/uL 3.69(L) 3.11(L) 2.95(L)  HGB 12.0 - 15.0 g/dL 9.9(L) 8.3(L) 8.1(L)  HCT 36.0 - 46.0 % 31.7(L) 26.4(L) 26.6(L)  PLT 150 - 400 K/uL 224 342 332  NEUTROABS 1.4 - 7.0 x10E3/uL - 5.8 -  LYMPHSABS 0.7 - 3.1 x10E3/uL - 1.6 -     There is no height or weight on file to calculate BMI.  Orders:  No orders of the defined types were placed in this encounter.  No orders of the defined types were placed in this encounter.  Procedures: No procedures performed  Clinical Data: No additional findings.  ROS:  All other systems negative, except as noted in the HPI. Review of Systems  Objective: Vital Signs: There were no vitals taken for this visit.  Specialty Comments:  No specialty comments available.  PMFS History: Patient Active Problem List   Diagnosis Date Noted   Homelessness 11/19/2021   Right great toe amputee ([place]) 11/03/2021   Class 2 severe obesity due to excess calories with serious comorbidity and body mass index (BMI) of 36.0 to 36.9 in adult [facility]) 10/15/2021   Insomnia 10/07/2021   Localized edema 10/07/2021   Left below-knee amputee ([place]) 07/01/2021   Normocytic anemia 06/26/2021   Heart failure with mid-range ejection fraction ([place]) 06/26/2021   Diabetic nephropathy with  proteinuria ([place]) 06/26/2021   Diabetic foot ulcer ([place]) 05/26/2021   Dental caries 04/01/2020   Cracked tooth 04/01/2020   Alcohol use 03/11/2020   Vitamin D deficiency 03/11/2020   Prolonged QT interval 02/26/2020   Chronic combined systolic and diastolic CHF (congestive heart failure) ([place]) 02/26/2020   Heart block AV complete ([place]) 02/11/2020   Exocrine pancreatic insufficiency    Dysmenorrhea 10/02/2019   DM type 2 with diabetic peripheral neuropathy ([place]) 05/30/2019   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 block) 05/29/2019   Type 2 diabetes mellitus ([place]) 10/21/2016   Former smoker 11/26/2013   Hypercholesteremia 02/02/2007   Bipolar 1 disorder ([place]) 02/02/2007   HYPERTENSION, BENIGN SYSTEMIC 02/02/2007   Past Medical History:  Diagnosis Date   Abnormal Pap smear 1998   Abnormal vaginal bleeding 12/19/2011   Acute urinary retention 06/26/2021   Arthritis    Bacterial infection    Bipolar 1 disorder ([place])    Blister of second toe of left foot 11/21/2016   Candida vaginitis 07/2007   Depression    recently added wellbutrin-has not taken yet for bipolar   Diabetes in pregnancy    Diabetes mellitus    nph 20U qam and qpm, regular with meals   Diabetic ketoacidosis without coma associated with type 2 diabetes mellitus ([place])    Fibroid    Galactorrhea of right breast 2008   GERD (gastroesophageal reflux disease)    H/O amenorrhea 07/2007   H/O dizziness 10/14/2011   H/O dysmenorrhea 2010   H/O menorrhagia 10/14/2011   H/O varicella    Headache(784.0)    Heavy vaginal bleeding due to contraceptive injection use 10/12/2011   Depo Provera   Herpes    HSV-2 infection 01/03/2009   Hx: UTI (urinary tract infection) 2009   Hypertension    on aldomet   Hypoalbuminemia due to protein-calorie malnutrition ([place]) 06/26/2021   Increased BMI 2010   Irregular uterine bleeding 04/04/2012   Pt has mirena    Obesity 10/14/2011   Oligomenorrhea 07/2007    Pelvic pain in female    Presence of permanent cardiac pacemaker    Preterm labor    Trichomonas    Yeast infection     Family History  Problem Relation Age of Onset   Hypertension Mother    Diabetes Mother    Heart disease Mother    Hypertension Father    Diabetes Father    Heart disease Father    Stroke Maternal Grandfather    Other Neg Hx     Past Surgical History:  Procedure Laterality Date   ABDOMINAL AORTOGRAM W/LOWER EXTREMITY N/A 06/17/2021   Procedure: ABDOMINAL AORTOGRAM W/LOWER EXTREMITY;  Surgeon: Wellington Hampshire, MD;  Location:  [facility];  Service: Cardiovascular;  Laterality: N/A;   AMPUTATION Left 06/27/2021   Procedure: AMPUTATION BELOW KNEE;  Surgeon: Newt Minion, MD;  Location: [facility];  Service: Orthopedics;  Laterality: Left;   AMPUTATION Right 09/04/2021   Procedure: RIGHT GREAT TOE AMPUTATION;  Surgeon: Newt Minion, MD;  Location: [facility];  Service: Orthopedics;  Laterality: Right;   CESAREAN SECTION  1991   LEFT HEART CATH AND CORONARY ANGIOGRAPHY N/A 02/10/2020   Procedure: LEFT HEART CATH AND CORONARY ANGIOGRAPHY;  Surgeon: Troy Sine, MD;  Location: [facility];  Service: Cardiovascular;  Laterality: N/A;   PACEMAKER IMPLANT N/A 02/13/2020   Procedure: PACEMAKER IMPLANT;  Surgeon: Constance Haw, MD;  Location: [facility];  Service: Cardiovascular;  Laterality: N/A;   TEMPORARY PACEMAKER N/A 02/10/2020   Procedure: TEMPORARY PACEMAKER;  Surgeon: Troy Sine, MD;  Location: [facility];  Service: Cardiovascular;  Laterality: N/A;   Social History   Occupational History   Not on file  Tobacco Use   Smoking status: Former    Packs/day: 0.50    Years: 20.00    Pack years: 10.00    Types: Cigarettes    Quit date: 07/01/2021    Years since quitting: 0.5   Smokeless tobacco: Never  Vaping Use   Vaping Use: Never used  Substance and Sexual Activity   Alcohol use: No   Drug use: No   Sexual activity: Yes     Birth control/protection: None

## 2022-01-26 ENCOUNTER — Ambulatory Visit (INDEPENDENT_AMBULATORY_CARE_PROVIDER_SITE_OTHER): Payer: Medicaid Other

## 2022-01-26 ENCOUNTER — Ambulatory Visit (HOSPITAL_COMMUNITY)
Admission: RE | Admit: 2022-01-26 | Discharge: 2022-01-26 | Disposition: A | Discharge status: Home / Self Care | Payer: Medicaid Other | Source: Ambulatory Visit | Attending: Physician Assistant | Admitting: Physician Assistant

## 2022-01-26 DIAGNOSIS — I442 Atrioventricular block, complete: Secondary | ICD-10-CM | POA: Diagnosis not present

## 2022-01-26 DIAGNOSIS — I43 Cardiomyopathy in diseases classified elsewhere: Secondary | ICD-10-CM | POA: Insufficient documentation

## 2022-01-26 DIAGNOSIS — R931 Abnormal findings on diagnostic imaging of heart and coronary circulation: Secondary | ICD-10-CM | POA: Diagnosis not present

## 2022-01-26 DIAGNOSIS — E854 Organ-limited amyloidosis: Secondary | ICD-10-CM | POA: Diagnosis not present

## 2022-01-26 DIAGNOSIS — I3139 Other pericardial effusion (noninflammatory): Secondary | ICD-10-CM | POA: Diagnosis not present

## 2022-01-26 LAB — CBC
HCT: 32.3 % — ABNORMAL LOW (ref 36.0–46.0)
Hemoglobin: 10.3 g/dL — ABNORMAL LOW (ref 12.0–15.0)
MCH: 27.2 pg (ref 26.0–34.0)
MCHC: 31.9 g/dL (ref 30.0–36.0)
MCV: 85.4 fL (ref 80.0–100.0)
Platelets: 241 10*3/uL (ref 150–400)
RBC: 3.78 MIL/uL — ABNORMAL LOW (ref 3.87–5.11)
RDW: 15.7 % — ABNORMAL HIGH (ref 11.5–15.5)
WBC: 6.9 10*3/uL (ref 4.0–10.5)
nRBC: 0 % (ref 0.0–0.2)

## 2022-01-26 MED ORDER — GADOBUTROL 1 MMOL/ML IV SOLN
10.0000 mL | Freq: Once | INTRAVENOUS | Status: AC | PRN
  Administered 2022-01-26: 10 mL via INTRAVENOUS

## 2022-01-27 LAB — CUP PACEART REMOTE DEVICE CHECK
Battery Remaining Longevity: 91 mo
Battery Remaining Percentage: 81 %
Battery Voltage: 2.99 V
Brady Statistic AP VP Percent: 0 %
Brady Statistic AP VS Percent: 0 %
Brady Statistic AS VP Percent: 88 %
Brady Statistic AS VS Percent: 1 %
Brady Statistic RA Percent Paced: 0 %
Brady Statistic RV Percent Paced: 88 %
Date Time Interrogation Session: 20230221142323
Implantable Lead Implant Date: 20210310
Implantable Lead Implant Date: 20210310
Implantable Lead Location: 753859
Implantable Lead Location: 753860
Implantable Pulse Generator Implant Date: 20210310
Lead Channel Impedance Value: 440 Ohm
Lead Channel Impedance Value: 450 Ohm
Lead Channel Pacing Threshold Amplitude: 0.75 V
Lead Channel Pacing Threshold Amplitude: 0.75 V
Lead Channel Pacing Threshold Pulse Width: 0.5 ms
Lead Channel Pacing Threshold Pulse Width: 0.5 ms
Lead Channel Sensing Intrinsic Amplitude: 12 mV
Lead Channel Sensing Intrinsic Amplitude: 5 mV
Lead Channel Setting Pacing Amplitude: 1 V
Lead Channel Setting Pacing Amplitude: 1.75 V
Lead Channel Setting Pacing Pulse Width: 0.5 ms
Lead Channel Setting Sensing Sensitivity: 2 mV
Pulse Gen Model: 2272
Pulse Gen Serial Number: 3802095

## 2022-01-28 ENCOUNTER — Other Ambulatory Visit: Payer: Self-pay | Admitting: *Deleted

## 2022-01-28 ENCOUNTER — Other Ambulatory Visit: Payer: Self-pay

## 2022-01-28 ENCOUNTER — Other Ambulatory Visit: Payer: Medicaid Other

## 2022-01-28 DIAGNOSIS — I515 Myocardial degeneration: Secondary | ICD-10-CM

## 2022-01-28 DIAGNOSIS — Z79899 Other long term (current) drug therapy: Secondary | ICD-10-CM | POA: Diagnosis not present

## 2022-01-28 LAB — BASIC METABOLIC PANEL
BUN/Creatinine Ratio: 23 (ref 9–23)
BUN: 26 mg/dL — ABNORMAL HIGH (ref 6–24)
CO2: 20 mmol/L (ref 20–29)
Calcium: 9.3 mg/dL (ref 8.7–10.2)
Chloride: 109 mmol/L — ABNORMAL HIGH (ref 96–106)
Creatinine, Ser: 1.12 mg/dL — ABNORMAL HIGH (ref 0.57–1.00)
Glucose: 90 mg/dL (ref 70–99)
Potassium: 4.4 mmol/L (ref 3.5–5.2)
Sodium: 141 mmol/L (ref 134–144)
eGFR: 61 mL/min/{1.73_m2} (ref >59)

## 2022-01-29 ENCOUNTER — Telehealth: Payer: Self-pay | Admitting: *Deleted

## 2022-01-29 NOTE — Telephone Encounter (Signed)
-----   Message from [place], [place] sent at 01/28/2022  6:20 PM EST ----- Stable labs

## 2022-01-29 NOTE — Telephone Encounter (Signed)
Spoke with patient aware of results and verbalized understanding. Patient wanted to know if she should remain taking Furosemide every other day and was told  yes unless told other wise by Cardiology.

## 2022-02-02 NOTE — Progress Notes (Signed)
Remote pacemaker transmission.   

## 2022-02-03 ENCOUNTER — Encounter: Payer: Self-pay | Admitting: Internal Medicine

## 2022-02-03 ENCOUNTER — Other Ambulatory Visit: Payer: Self-pay

## 2022-02-03 ENCOUNTER — Ambulatory Visit: Payer: Medicaid Other | Admitting: Internal Medicine

## 2022-02-03 VITALS — BP 146/80 | HR 80 | Ht 64.0 in | Wt 210.0 lb

## 2022-02-03 DIAGNOSIS — Z794 Long term (current) use of insulin: Secondary | ICD-10-CM | POA: Diagnosis not present

## 2022-02-03 DIAGNOSIS — N181 Chronic kidney disease, stage 1: Secondary | ICD-10-CM

## 2022-02-03 DIAGNOSIS — E1142 Type 2 diabetes mellitus with diabetic polyneuropathy: Secondary | ICD-10-CM | POA: Diagnosis not present

## 2022-02-03 DIAGNOSIS — E1122 Type 2 diabetes mellitus with diabetic chronic kidney disease: Secondary | ICD-10-CM

## 2022-02-03 DIAGNOSIS — R739 Hyperglycemia, unspecified: Secondary | ICD-10-CM

## 2022-02-03 DIAGNOSIS — R809 Proteinuria, unspecified: Secondary | ICD-10-CM | POA: Diagnosis not present

## 2022-02-03 DIAGNOSIS — E1165 Type 2 diabetes mellitus with hyperglycemia: Secondary | ICD-10-CM | POA: Diagnosis not present

## 2022-02-03 DIAGNOSIS — E785 Hyperlipidemia, unspecified: Secondary | ICD-10-CM | POA: Diagnosis not present

## 2022-02-03 LAB — MICROALBUMIN / CREATININE URINE RATIO
Creatinine,U: 70.2 mg/dL
Microalb Creat Ratio: 196.2 mg/g — ABNORMAL HIGH (ref 0.0–30.0)
Microalb, Ur: 137.7 mg/dL — ABNORMAL HIGH (ref 0.0–1.9)

## 2022-02-03 LAB — POCT GLYCOSYLATED HEMOGLOBIN (HGB A1C): Hemoglobin A1C: 7.9 % — AB (ref 4.0–5.6)

## 2022-02-03 MED ORDER — LANTUS SOLOSTAR 100 UNIT/ML ~~LOC~~ SOPN
50.0000 [IU] | PEN_INJECTOR | Freq: Every day | SUBCUTANEOUS | 6 refills | Status: DC
  Filled 2022-02-03 – 2022-03-18 (×2): qty 30, 60d supply, fill #0

## 2022-02-03 MED ORDER — INSULIN LISPRO (1 UNIT DIAL) 100 UNIT/ML (KWIKPEN)
PEN_INJECTOR | SUBCUTANEOUS | 6 refills | Status: DC
  Filled 2022-02-03: qty 18, 30d supply, fill #0

## 2022-02-03 NOTE — Progress Notes (Signed)
°Name: Sarah Long  °MRN/ DOB: [id number], 03/21/1971   °Age/ Sex: 50 y.o., female   ° °PCP: Wright, Patrick E, MD   °Reason for Endocrinology Evaluation: Type 2 Diabetes Mellitus  °   °Date of Initial Endocrinology Visit: 11/02/2021  ° ° °PATIENT IDENTIFIER: Sarah Long is a 50 y.o. female with a past medical history of HTN , Bipolar d/o,and T2DM, has Hx of Pancreatitis . The patient presented for initial endocrinology clinic visit on 11/02/2021 for consultative assistance with her diabetes management.  ° ° °HPI: °Sarah Long was  ° ° °Diagnosed with DM at age 18 stated as gestational diabetes by age 30 she became diagnosed with T2 DM  °Prior Medications tried/Intolerance: Intolerant to Metformin.  °Hemoglobin A1c has ranged from 7.4% in 2022, peaking at 13.8% in 2014. °Patient has required hospitalization within the last 1 year from hyper or hypoglycemia: DKA 06/2021 ° ° °On her initial visit to our clinic she had an A1c 7.4%, we continued MDI regimen and provided her with correction scale  ° °SUBJECTIVE:  ° °During the last visit (11/02/2021): A1c 7.4% continued MDI regimen . Dexcom prescribed  ° °Today (02/03/22): Sarah Long is here for a follow up on diabetes management. She checks her  blood sugars multiple times daily through CGM.  The patient has  had hypoglycemic episodes since the last clinic visit, which typically occur rarely and after a bolus.   ° °She has made lifestyle changes with weight loss  °Has noted prosthetics  ° °HOME DIABETES REGIMEN: °Lantus 35 units daily - takes 45  °Humalog 10 units TID QAC- 10-15 units  °CF: Humalog (BG -130/25)  ° ° °Statin: no °ACE-I/ARB: no °Prior Diabetic Education: yes ° ° °CONTINUOUS GLUCOSE MONITORING RECORD INTERPRETATION   ° °Dates of Recording: 2/16-02/03/2022 ° °Sensor description:dexcom ° °Results statistics: °  °CGM use % of time 93  °Average and SD 185/61  °Time in range   52     %  °% Time Above 180 36  °% Time above 250 12  °% Time Below target 0   ° ° ° ° °Glycemic patterns summary: BG's within goal at night, high during the day  ° °Hyperglycemic episodes postprandial  ° °Hypoglycemic episodes occurred post bolus ° °Overnight periods: trends down ° ° ° °DIABETIC COMPLICATIONS: °Microvascular complications:  °Left BKA, Right  great toe amputation , neuropathy  °Denies: CKD, retinopathy  °Last eye exam: Completed 01/2021 ° °Macrovascular complications:  °PAD °Denies: CAD, CVA ° ° °PAST HISTORY: °Past Medical History:  °Past Medical History:  °Diagnosis Date  ° Abnormal Pap smear 1998  ° Abnormal vaginal bleeding 12/19/2011  ° Acute urinary retention 06/26/2021  ° Arthritis   ° Bacterial infection   ° Bipolar 1 disorder ([facility])   ° Blister of second toe of left foot 11/21/2016  ° Candida vaginitis 07/2007  ° Depression   ° recently added wellbutrin-has not taken yet for bipolar  ° Diabetes in pregnancy   ° Diabetes mellitus   ° nph 20U qam and qpm, regular with meals  ° Diabetic ketoacidosis without coma associated with type 2 diabetes mellitus ([facility])   ° Fibroid   ° Galactorrhea of right breast 2008  ° GERD (gastroesophageal reflux disease)   ° H/O amenorrhea 07/2007  ° H/O dizziness 10/14/2011  ° H/O dysmenorrhea 2010  ° H/O menorrhagia 10/14/2011  ° H/O varicella   ° Headache(784.0)   ° Heavy vaginal bleeding due to contraceptive injection use 10/12/2011  °   Depo Provera  ° Herpes   ° HSV-2 infection 01/03/2009  ° Hx: UTI (urinary tract infection) 2009  ° Hypertension   ° on aldomet  ° Hypoalbuminemia due to protein-calorie malnutrition ([facility]) 06/26/2021  ° Increased BMI 2010  ° Irregular uterine bleeding 04/04/2012  ° Pt has mirena   ° Obesity 10/14/2011  ° Oligomenorrhea 07/2007  ° Pelvic pain in female   ° Presence of permanent cardiac pacemaker   ° Preterm labor   ° Trichomonas   ° Yeast infection   ° °Past Surgical History:  °Past Surgical History:  °Procedure Laterality Date  ° ABDOMINAL AORTOGRAM W/LOWER EXTREMITY N/A 06/17/2021  ° Procedure: ABDOMINAL  AORTOGRAM W/LOWER EXTREMITY;  Surgeon: Arida, Muhammad A, MD;  [facility];  Service: Cardiovascular;  Laterality: N/A;  ° AMPUTATION Left 06/27/2021  ° Procedure: AMPUTATION BELOW KNEE;  Surgeon: Duda, Marcus V, MD;  [facility];  Service: Orthopedics;  Laterality: Left;  ° AMPUTATION Right 09/04/2021  ° Procedure: RIGHT GREAT TOE AMPUTATION;  Surgeon: Duda, Marcus V, MD;  [facility];  Service: Orthopedics;  Laterality: Right;  ° CESAREAN SECTION  1991  ° LEFT HEART CATH AND CORONARY ANGIOGRAPHY N/A 02/10/2020  ° Procedure: LEFT HEART CATH AND CORONARY ANGIOGRAPHY;  Surgeon: Kelly, Thomas A, MD;  [facility];  Service: Cardiovascular;  Laterality: N/A;  ° PACEMAKER IMPLANT N/A 02/13/2020  ° Procedure: PACEMAKER IMPLANT;  Surgeon: Camnitz, Will Martin, MD;  [facility];  Service: Cardiovascular;  Laterality: N/A;  ° TEMPORARY PACEMAKER N/A 02/10/2020  ° Procedure: TEMPORARY PACEMAKER;  Surgeon: Kelly, Thomas A, MD;  [facility];  Service: Cardiovascular;  Laterality: N/A;  °  °Social History:  reports that she quit smoking about 7 months ago. Her smoking use included cigarettes. She has a 10.00 pack-year smoking history. She has never used smokeless tobacco. She reports that she does not drink alcohol and does not use drugs. °Family History:  °Family History  °Problem Relation Age of Onset  ° Hypertension Mother   ° Diabetes Mother   ° Heart disease Mother   ° Hypertension Father   ° Diabetes Father   ° Heart disease Father   ° Stroke Maternal Grandfather   ° Other Neg Hx   ° ° ° °HOME MEDICATIONS: °Allergies as of 02/03/2022   ° °   Reactions  ° Strawberry Extract Hives  ° Sulfa Antibiotics Hives, Itching  ° °  ° °  °Medication List  °  ° °  ° Accurate as of February 03, 2022 10:56 AM. If you have any questions, ask your nurse or doctor.  °  °  ° °  ° °STOP taking these medications   ° °acetaminophen 325 MG tablet °Commonly known as: TYLENOL °Stopped  by:  J , MD °  °chlorhexidine 0.12 % solution °Commonly known as: Peridex °Stopped by:  J , MD °  °Dexcom G6 Receiver Devi °Stopped by:  J , MD °  ° °  ° °TAKE these medications   ° °Accu-Chek Guide w/Device Kit °Use as directed °  °Accu-Chek Softclix Lancets lancets °Use as directed up to 4 times daily °  °ADULT ONE DAILY GUMMIES PO °Take 1 capsule by mouth daily. °  °atorvastatin 20 MG tablet °Commonly known as: LIPITOR °Take 1 tablet (20 mg total) by mouth daily. °  °B-D UF III MINI PEN NEEDLES 31G X 5 MM Misc °Generic drug: Insulin Pen Needle °Use as   directed in the morning, at noon, in the evening, and at bedtime.   carvedilol 6.25 MG tablet Commonly known as: COREG Take 1 tablet (6.25 mg total) by mouth 2 (two) times daily with a meal.   Dexcom G6 Sensor Misc 1 Device by Does not apply route as directed.   Dexcom G6 Transmitter Misc 1 Device by Does not apply route as directed.   furosemide 20 MG tablet Commonly known as: LASIX Take 1 tablet (20 mg total) by mouth every other day.   insulin lispro 100 UNIT/ML KwikPen Commonly known as: HUMALOG use as directed (max daily dose: 60 units) What changed:  how much to take how to take this when to take this additional instructions Changed by: Dorita Sciara, MD   Lantus SoloStar 100 UNIT/ML Solostar Pen Generic drug: insulin glargine Inject 50 Units into the skin daily. What changed: how much to take Changed by: Dorita Sciara, MD   losartan 25 MG tablet Commonly known as: COZAAR Take 1 tablet (25 mg total) by mouth daily.         ALLERGIES: Allergies  Allergen Reactions   Strawberry Extract Hives   Sulfa Antibiotics Hives and Itching     REVIEW OF SYSTEMS: A comprehensive ROS was conducted with the patient and is negative except as per HPI     OBJECTIVE:   VITAL SIGNS: BP (!) 146/80 (BP Location: Left Arm, Patient Position: Sitting, Cuff Size:  Small)    Pulse 80    Ht 5' 4" (1.626 m)    Wt 210 lb (95.3 kg)    BMI 36.05 kg/m    PHYSICAL EXAM:  General: Pt appears well and is in NAD  Neck: General: Supple without adenopathy or carotid bruits. Thyroid: Thyroid size normal.  No goiter or nodules appreciated.  Lungs: Clear with good BS bilat with no rales, rhonchi, or wheezes  Heart: RRR with normal S1 and S2 and no gallops; no murmurs; no rub  Abdomen: Normoactive bowel sounds, soft, nontender, without masses or organomegaly palpable  Extremities: Left BKA  Trace edema on the right   Neuro: MS is good with appropriate affect, pt is alert and Ox3   DM Foot Exam 02/04/2022   The skin of the feet is intact without sores or ulcerations. The pedal pulses are 2+ on right and 2+ on left. The sensation is intact to a screening 5.07, 10 gram monofilament bilaterally   DATA REVIEWED:  Lab Results  Component Value Date   HGBA1C 7.9 (A) 02/03/2022   HGBA1C 7.4 (A) 10/14/2021   HGBA1C 13.2 (H) 07/02/2021   Lab Results  Component Value Date   MICROALBUR 27.9 12/22/2015   LDLCALC 40 06/18/2021   CREATININE 1.12 (H) 01/28/2022   Lab Results  Component Value Date   MICRALBCREAT 729 (H) 06/18/2021    Lab Results  Component Value Date   CHOL 98 (L) 06/18/2021   HDL 37 (L) 06/18/2021   LDLCALC 40 06/18/2021   TRIG 117 06/18/2021   CHOLHDL 2.6 06/18/2021        Latest Reference Range & Units 02/03/22 10:34  MICROALB/CREAT RATIO 0.0 - 30.0 mg/g 196.2 (H)      ASSESSMENT / PLAN / RECOMMENDATIONS:   1) Type 2 Diabetes Mellitus, Sub-Optimally controlled, With neuropathic complications, S/P Left BKA and Right great toe amputation  - Most recent A1c of 7.9 %. Goal A1c < 7.0 %.     - A1c remains above goal - She unfortunately has self adjusted  her insulin regimen, it appears that she has not been using the correction scale, she does admit to guessing on prandial dose has occasional hypoglycemia °-We discussed the importance of  following instructions so that we make the appropriate changes were necessary °-In review of her CGM, I am going to increase her basal as well as prandial dose, she will be provided with a new correction scale °- She has a hx of Pancreatitis , not a candidate for DPP 4 inhibitors and GLP-1 agonist °- Has hx of DKA , would avoid SGLT2 inhibitors ° ° ° °MEDICATIONS: °Increase Lantus to 50 units daily  °Take Humalog 12 units TIDQAC °Correction Factor: Humalog (BG -130/25)  ° °EDUCATION / INSTRUCTIONS: °BG monitoring instructions: Patient is instructed to check her blood sugars 3 times a day, before meals . °Call [facility] if: BG persistently < 70  °I reviewed the Rule of 15 for the treatment of hypoglycemia in detail with the patient. Literature supplied. ° ° °2) Diabetic complications:  °Eye: Does not have known diabetic retinopathy.  °Neuro/ Feet: Does  have known diabetic peripheral neuropathy. °Renal: Patient does not have known baseline CKD. She is not on an ACEI/ARB at present. ° ° °3) Dyslipidemia : ° ° °- She is not on atorvastatin, patient does not like taking medication and would rather use natural route if possible °-We discussed the increased risk of cardiovascular disease as well as CVA with hyperlipidemia in the setting of diabetes ° °4) Microalbuminuria: ° ° ° °-She has losartan listed but she is not taking it.  We discussed the renal benefits with ARB and I have encouraged her to consider taking it ° °Medication °Restart losartan 25 mg daily ° ° ° ° °Follow-up in 4 months ° °Signed electronically by: °Abby Jaralla , MD ° ° Endocrinology  °[facility] °[address] °[place], [place] [address] °Phone: [phone] °FAX: [phone]  ° °CC: °Wright, Patrick E, MD °[address] °[place] [address] °Phone: [phone]  °Fax: [phone] ° ° ° °Return to Endocrinology clinic as below: °Future Appointments  °Date Time Provider Department  Center  °03/02/2022  1:00 PM Raulkar, Krutika P, MD [facility] CPR  °03/22/2022 10:00 AM Wright, Patrick E, MD [facility] None  °04/13/2022  1:45 PM Camnitz, Will Martin, MD [facility]  °04/26/2022 10:30 AM Duda, Marcus V, MD [facility] None  °04/27/2022  6:10 PM [facility] DEVICE REMOTES [facility]  °06/03/2022  9:40 AM Raulkar, Krutika P, MD [facility] CPR  °06/14/2022 10:10 AM ,  Jaralla, MD [facility] None  °07/27/2022  6:10 PM [facility] DEVICE REMOTES [facility]  °09/07/2022 10:20 AM Raulkar, Krutika P, MD [facility] CPR  °10/26/2022  6:10 PM [facility] DEVICE REMOTES [facility]  °12/09/2022  9:45 AM Raulkar, Krutika P, MD [facility]  °01/25/2023  6:10 PM [facility] DEVICE REMOTES [facility]  °  ° °

## 2022-02-03 NOTE — Patient Instructions (Addendum)
Increase Lantus 50  units daily  ?Take Humalog 12 units with each meal  ?Humalog correctional insulin: ADD extra units on insulin to your meal-time Humalog dose if your blood sugars are higher than 155. Use the scale below to help guide you:  ? ?Blood sugar before meal Number of units to inject  ?Less than 155 0 unit  ? 156 -  180 1 units  ?181 -  205 2 units  ?206 -  230 3 units  ?231 -  255 4 units  ?256 -  280 5 units  ?281 -  305 6 units  ?306 -  330 7 units  ?331 -  355 8 units  ? ?HOW TO TREAT LOW BLOOD SUGARS (Blood sugar LESS THAN 70 MG/DL) ?Please follow the RULE OF 15 for the treatment of hypoglycemia treatment (when your (blood sugars are less than 70 mg/dL)  ? ?STEP 1: Take 15 grams of carbohydrates when your blood sugar is low, which includes:  ?3-4 GLUCOSE TABS  OR ?3-4 OZ OF JUICE OR REGULAR SODA OR ?ONE TUBE OF GLUCOSE GEL   ? ?STEP 2: RECHECK blood sugar in 15 MINUTES ?STEP 3: If your blood sugar is still low at the 15 minute recheck --> then, go back to STEP 1 and treat AGAIN with another 15 grams of carbohydrates. ? ?

## 2022-02-04 ENCOUNTER — Other Ambulatory Visit: Payer: Self-pay

## 2022-02-04 MED ORDER — LOSARTAN POTASSIUM 25 MG PO TABS
25.0000 mg | ORAL_TABLET | Freq: Every day | ORAL | 3 refills | Status: DC
  Filled 2022-02-04 – 2022-03-18 (×2): qty 90, 90d supply, fill #0

## 2022-03-02 ENCOUNTER — Other Ambulatory Visit: Payer: Self-pay

## 2022-03-02 ENCOUNTER — Encounter
Payer: Medicaid Other | Attending: Physical Medicine and Rehabilitation | Admitting: Physical Medicine and Rehabilitation

## 2022-03-02 ENCOUNTER — Encounter: Payer: Self-pay | Admitting: Physical Medicine and Rehabilitation

## 2022-03-02 VITALS — BP 169/108 | HR 101 | Ht 64.0 in | Wt 204.0 lb

## 2022-03-02 DIAGNOSIS — Z794 Long term (current) use of insulin: Secondary | ICD-10-CM | POA: Insufficient documentation

## 2022-03-02 DIAGNOSIS — E1142 Type 2 diabetes mellitus with diabetic polyneuropathy: Secondary | ICD-10-CM | POA: Insufficient documentation

## 2022-03-02 DIAGNOSIS — G546 Phantom limb syndrome with pain: Secondary | ICD-10-CM | POA: Diagnosis not present

## 2022-03-02 NOTE — Progress Notes (Signed)
? ?Subjective:  ? ? Patient ID: Sarah Long, female    DOB: 12-Apr-1971, 51 y.o.   MRN: [id number] ? ?HPI ?Sarah Long is a 51 year old woman who presents for follow-up of left BKA.  ? ?1) Type 2 DM ?-she has an appointment with an endocrinologist ?-she has tingling and numbness in her right foot ?-she is getting phantom limb pain in left leg.  ?-she has follow-up with Dr. Sharol Given  and PCP ?-she is willing to try nerve medications ?-she has neuropathic pain throughout her right lower extremity ?-does not require any refills today ?-she had 60% improvement with the Qutenza up until the follow-up appointment. ? ?2) Constipation: ?-she does not lie the Miralax ?-she is worried about being constipated wit the iron.  ? ?3) Left BKA ?-she has severe phantom limb pain, Qutenza helped 60%.  ?-no pain in the residual limb ?-tolerating prosthesis well ? ?4) Right lower extremity edema ?-compression garments ordered by Dr. Sharol Given ?-she does not like how much the lasix makes her urinate ?-does not require any refills today ?-resolved ? ?5) Menarche ?-returned ? ?Pain Inventory ?Average Pain 6 ?Pain Right Now 7 ?My pain is stabbing and aching ? ?In the last 24 hours, has pain interfered with the following? ?General activity 6 ?Relation with others 6 ?Enjoyment of life 7 ?What TIME of day is your pain at its worst? evening ?Sleep (in general) Poor ? ?Pain is worse with: inactivity ?Pain improves with: rest and therapy/exercise ?Relief from Meds: 6 ? ?use a walker ?how many minutes can you walk? 5 ?ability to climb steps?  no ? ?disabled: date disabled 06/27/2021 ? ?numbness ?tingling ?spasms ?depression ?anxiety ? ?N/a ? ?N/a ? ? ? ?Family History  ?Problem Relation Age of Onset  ? Hypertension Mother   ? Diabetes Mother   ? Heart disease Mother   ? Hypertension Father   ? Diabetes Father   ? Heart disease Father   ? Stroke Maternal Grandfather   ? Other Neg Hx   ? ?Social History  ? ?Socioeconomic History  ? Marital status: Significant  Other  ?  Spouse name: Not on file  ? Number of children: Not on file  ? Years of education: Not on file  ? Highest education level: Not on file  ?Occupational History  ? Not on file  ?Tobacco Use  ? Smoking status: Former  ?  Packs/day: 0.50  ?  Years: 20.00  ?  Pack years: 10.00  ?  Types: Cigarettes  ?  Quit date: 07/01/2021  ?  Years since quitting: 0.6  ? Smokeless tobacco: Never  ?Vaping Use  ? Vaping Use: Never used  ?Substance and Sexual Activity  ? Alcohol use: No  ? Drug use: No  ? Sexual activity: Yes  ?  Birth control/protection: None  ?Other Topics Concern  ? Not on file  ?Social History Narrative  ? Not on file  ? ?Social Determinants of Health  ? ?Financial Resource Strain: Not on file  ?Food Insecurity: Not on file  ?Transportation Needs: Not on file  ?Physical Activity: Not on file  ?Stress: Not on file  ?Social Connections: Not on file  ? ?Past Surgical History:  ?Procedure Laterality Date  ? ABDOMINAL AORTOGRAM W/LOWER EXTREMITY N/A 06/17/2021  ? Procedure: ABDOMINAL AORTOGRAM W/LOWER EXTREMITY;  Surgeon: Wellington Hampshire, MD;  Location: [facility];  Service: Cardiovascular;  Laterality: N/A;  ? AMPUTATION Left 06/27/2021  ? Procedure: AMPUTATION BELOW KNEE;  Surgeon:  Newt Minion, MD;  Location: [facility];  Service: Orthopedics;  Laterality: Left;  ? AMPUTATION Right 09/04/2021  ? Procedure: RIGHT GREAT TOE AMPUTATION;  Surgeon: Newt Minion, MD;  Location: [facility];  Service: Orthopedics;  Laterality: Right;  ? [place]  ? LEFT HEART CATH AND CORONARY ANGIOGRAPHY N/A 02/10/2020  ? Procedure: LEFT HEART CATH AND CORONARY ANGIOGRAPHY;  Surgeon: Troy Sine, MD;  Location: [facility];  Service: Cardiovascular;  Laterality: N/A;  ? PACEMAKER IMPLANT N/A 02/13/2020  ? Procedure: PACEMAKER IMPLANT;  Surgeon: Constance Haw, MD;  Location: [facility];  Service: Cardiovascular;  Laterality: N/A;  ? TEMPORARY PACEMAKER N/A 02/10/2020  ? Procedure: TEMPORARY PACEMAKER;   Surgeon: Troy Sine, MD;  Location: [facility];  Service: Cardiovascular;  Laterality: N/A;  ? ?Past Medical History:  ?Diagnosis Date  ? Abnormal Pap smear 1998  ? Abnormal vaginal bleeding 12/19/2011  ? Acute urinary retention 06/26/2021  ? Arthritis   ? Bacterial infection   ? Bipolar 1 disorder ([place])   ? Blister of second toe of left foot 11/21/2016  ? Candida vaginitis 07/2007  ? Depression   ? recently added wellbutrin-has not taken yet for bipolar  ? Diabetes in pregnancy   ? Diabetes mellitus   ? nph 20U qam and qpm, regular with meals  ? Diabetic ketoacidosis without coma associated with type 2 diabetes mellitus ([place])   ? Fibroid   ? Galactorrhea of right breast 2008  ? GERD (gastroesophageal reflux disease)   ? H/O amenorrhea 07/2007  ? H/O dizziness 10/14/2011  ? H/O dysmenorrhea 2010  ? H/O menorrhagia 10/14/2011  ? H/O varicella   ? Headache(784.0)   ? Heavy vaginal bleeding due to contraceptive injection use 10/12/2011  ? Depo Provera  ? Herpes   ? HSV-2 infection 01/03/2009  ? Hx: UTI (urinary tract infection) 2009  ? Hypertension   ? on aldomet  ? Hypoalbuminemia due to protein-calorie malnutrition ([place]) 06/26/2021  ? Increased BMI 2010  ? Irregular uterine bleeding 04/04/2012  ? Pt has mirena   ? Obesity 10/14/2011  ? Oligomenorrhea 07/2007  ? Pelvic pain in female   ? Presence of permanent cardiac pacemaker   ? Preterm labor   ? Trichomonas   ? Yeast infection   ? ?BP (!) 169/108   Pulse (!) 101   Ht '5\' 4"'$  (1.626 m)   Wt 204 lb (92.5 kg)   SpO2 98%   BMI 35.02 kg/m?  ? ?Opioid Risk Score:   ?Fall Risk Score:  `1 ? ?Depression screen PHQ 2/9 ? ? ?  03/02/2022  ? 12:57 PM 11/18/2021  ? 10:59 AM 11/10/2021  ?  9:43 AM 10/07/2021  ? 11:15 AM 08/05/2021  ? 11:23 AM 07/30/2021  ? 10:07 AM 06/18/2021  ?  9:57 AM  ?Depression screen PHQ 2/9  ?Decreased Interest 0 2 0 0 '1 1 2  '$ ?Down, Depressed, Hopeless 0 2 0 '1 2 1 2  '$ ?PHQ - 2 Score 0 4 0 '1 3 2 4  '$ ?Altered sleeping  '2  1 2 3 3  '$ ?Tired, decreased  energy  '2  2 3 '$ 0 2  ?Change in appetite  1  2 0 0 2  ?Feeling bad or failure about yourself   1  0 0 0 1  ?Trouble concentrating  0  0 0 1 0  ?Moving slowly or fidgety/restless  0  0 0 0 1  ?  Suicidal thoughts  0  0 0 0 0  ?PHQ-9 Score  '10  6 8 6 13  '$ ?Difficult doing work/chores      Not difficult at all   ?  ? ? ?Review of Systems  ?Constitutional: Negative.   ?HENT: Negative.    ?Eyes: Negative.   ?Respiratory: Negative.    ?Cardiovascular: Negative.   ?Gastrointestinal: Negative.   ?Endocrine: Negative.   ?Genitourinary: Negative.   ?Musculoskeletal:   ?     Pain in left leg  ?Skin: Negative.   ?Allergic/Immunologic: Negative.   ?Neurological:  Positive for weakness and numbness.  ?Hematological: Negative.   ?Psychiatric/Behavioral:  Positive for dysphoric mood. The patient is nervous/anxious.   ? ?   ?Objective:  ? Physical Exam ?Gen: no distress, normal appearing, weight 204 lbs, BMI 35.02, BMI 169/108 ?HEENT: oral mucosa pink and moist, NCAT ?Cardio: Reg rate ?Chest: normal effort, normal rate of breathing ?Abd: soft, non-distended ?Ext: no edema ?Psych: pleasant, normal affect ?Skin: intact ?Neuro: Alert and oriented x3 ?Musculoskeletal: Left BKA healed well, ambulating with prosthetic well. Amputated digits of right foot ?   ?Assessment & Plan:  ?Mrs. Feltner is a 51 year old woman who presents for hospital follow-up after CIR admission for left BKA ? ?1) L BKA with phantom limb pain ?-continue follow-up with Dr. Sharol Given, healing well ?-increase gabapentin '300mg'$  TID ?-continue tapping and rubbing  ?-Discussed Qutenza as an option for neuropathic pain control. Discussed that this is a capsaicin patch, stronger than capsaicin cream. Discussed that it is currently approved for diabetic peripheral neuropathy and post-herpetic neuralgia, but that it has also shown benefit in treating other forms of neuropathy. Provided patient with link to site to learn more about the patch: [employer]. Discussed that the  patch would be placed in office and benefits usually last 3 months. Discussed that unintended exposure to capsaicin can cause severe irritation of eyes, mucous membranes, respiratory tract, and skin, b

## 2022-03-18 ENCOUNTER — Other Ambulatory Visit: Payer: Self-pay

## 2022-03-21 NOTE — Progress Notes (Signed)
? ?Established Patient Office Visit ? ?Subjective:  ?Patient ID: Sarah Long, female    DOB: October 08, 1971  Age: 51 y.o. MRN: [id number] ? ?CC:  ?Chief Complaint  ?Patient presents with  ? Hypertension  ? ? ?HPI ?11/2021 ?Sarah Long presents for primary care follow-up and diabetes management ? ?Since last seen in October the patient has continued improvement.  She has been followed closely by endocrinology and her A1c is now down to 7.4.  It had been greater than 12.  The patient also now has a Dexcom glucometer and has showing data suggesting she is staying in the 100-150 range of her blood sugars. ? ?The patient has had a left below-knee amputation now has a functional orthotic.  Patient is followed by rehab.  Patient's phantom limb pain is being managed by the rehab physician and she states is markedly better.  She has had 1 toe amputated on the right foot.  Recently developed a sore internally in her mouth.  This patient has poor dentition and needs to be seen by dentist.  Also she is living in a hotel and may be losing her housing soon and needs assistance in this regard.  Blood pressure is good on arrival 132/87 ? ?4/17 ?Patient seen in return follow-up unfortunately not been taking her oral medications because she cannot afford the co-pay.  She is taking her insulin products.  On arrival blood pressure is elevated 145/102.  She does have a Dexcom meter and recently saw endocrinology.  Below is documentation from that visit. ? ?Endo 02/2022 ?Today (02/03/22): Sarah Long is here for a follow up on diabetes management. She checks her  blood sugars multiple times daily through CGM.  The patient has  had hypoglycemic episodes since the last clinic visit, which typically occur rarely and after a bolus.   ?  ?She has made lifestyle changes with weight loss  ?Has noted prosthetics  ?  ?HOME DIABETES REGIMEN: ?Lantus 35 units daily - takes 45  ?Humalog 10 units TID QAC- 10-15 units  ?CF: Humalog (BG -130/25)  ?  ?   ?Statin: no ?ACE-I/ARB: no ?Prior Diabetic Education: yes ? Glycemic patterns summary: BG's within goal at night, high during the day  ?  ?Hyperglycemic episodes postprandial  ?  ?Hypoglycemic episodes occurred post bolus ?  ?Overnight periods: trends down ?  ?  ?  ?DIABETIC COMPLICATIONS: ?Microvascular complications:  ?Left BKA, Right  great toe amputation , neuropathy  ?Denies: CKD, retinopathy  ?Last eye exam: Completed 01/2021 ?  ?Macrovascular complications:  ?PAD ?Denies: CAD, CVA ?  ?1) Type 2 Diabetes Mellitus, Sub-Optimally controlled, With neuropathic complications, S/P Left BKA and Right great toe amputation  - Most recent A1c of 7.9 %. Goal A1c < 7.0 %.   ?  ?  ?- A1c remains above goal ?- She unfortunately has self adjusted her insulin regimen, it appears that she has not been using the correction scale, she does admit to guessing on prandial dose has occasional hypoglycemia ?-We discussed the importance of following instructions so that we make the appropriate changes were necessary ?-In review of her CGM, I am going to increase her basal as well as prandial dose, she will be provided with a new correction scale ?- She has a hx of Pancreatitis , not a candidate for DPP 4 inhibitors and GLP-1 agonist ?- Has hx of DKA , would avoid SGLT2 inhibitors ?  ?  ?  ?MEDICATIONS: ?Increase Lantus to 50 units daily  ?  Take Humalog 12 units TIDQAC ?Correction Factor: Humalog (BG -130/25)  ?  ?EDUCATION / INSTRUCTIONS: ?BG monitoring instructions: Patient is instructed to check her blood sugars 3 times a day, before meals . ?Call [facility] if: BG persistently < 70  ?I reviewed the Rule of 15 for the treatment of hypoglycemia in detail with the patient. Literature supplied. ?  ?  ?2) Diabetic complications:  ?Eye: Does not have known diabetic retinopathy.  ?Neuro/ Feet: Does  have known diabetic peripheral neuropathy. ?Renal: Patient does not have known baseline CKD. She is not on an ACEI/ARB at  present. ?  ?  ?3) Dyslipidemia : ?  ?  ?- She is not on atorvastatin, patient does not like taking medication and would rather use natural route if possible ?-We discussed the increased risk of cardiovascular disease as well as CVA with hyperlipidemia in the setting of diabetes ?  ?4) Microalbuminuria: ?  ?  ?  ?-She has losartan listed but she is not taking it.  We discussed the renal benefits with ARB and I have encouraged her to consider taking it ?  ?Medication ?Restart losartan 25 mg daily ?  ?Patient never restarted the losartan or atorvastatin because she cannot afford the $4 co-pay. ? ?She is using insulin as prescribed.  She still living in a motel with her children but is having difficulty affording the rent. ?Past Medical History:  ?Diagnosis Date  ? Abnormal Pap smear 1998  ? Abnormal vaginal bleeding 12/19/2011  ? Acute urinary retention 06/26/2021  ? Arthritis   ? Bacterial infection   ? Bipolar 1 disorder ([place])   ? Blister of second toe of left foot 11/21/2016  ? Candida vaginitis 07/2007  ? Depression   ? recently added wellbutrin-has not taken yet for bipolar  ? Diabetes in pregnancy   ? Diabetes mellitus   ? nph 20U qam and qpm, regular with meals  ? Diabetic ketoacidosis without coma associated with type 2 diabetes mellitus ([place])   ? Fibroid   ? Galactorrhea of right breast 2008  ? GERD (gastroesophageal reflux disease)   ? H/O amenorrhea 07/2007  ? H/O dizziness 10/14/2011  ? H/O dysmenorrhea 2010  ? H/O menorrhagia 10/14/2011  ? H/O varicella   ? Headache(784.0)   ? Heavy vaginal bleeding due to contraceptive injection use 10/12/2011  ? Depo Provera  ? Herpes   ? HSV-2 infection 01/03/2009  ? Hx: UTI (urinary tract infection) 2009  ? Hypertension   ? on aldomet  ? Hypoalbuminemia due to protein-calorie malnutrition ([place]) 06/26/2021  ? Increased BMI 2010  ? Irregular uterine bleeding 04/04/2012  ? Pt has mirena   ? Obesity 10/14/2011  ? Oligomenorrhea 07/2007  ? Osteomyelitis of great toe of  right foot ([place]) 03/22/2022  ? Pelvic pain in female   ? Presence of permanent cardiac pacemaker   ? Preterm labor   ? Trichomonas   ? Yeast infection   ? ? ?Past Surgical History:  ?Procedure Laterality Date  ? ABDOMINAL AORTOGRAM W/LOWER EXTREMITY N/A 06/17/2021  ? Procedure: ABDOMINAL AORTOGRAM W/LOWER EXTREMITY;  Surgeon: Wellington Hampshire, MD;  Location: [facility];  Service: Cardiovascular;  Laterality: N/A;  ? AMPUTATION Left 06/27/2021  ? Procedure: AMPUTATION BELOW KNEE;  Surgeon: Newt Minion, MD;  Location: [facility];  Service: Orthopedics;  Laterality: Left;  ? AMPUTATION Right 09/04/2021  ? Procedure: RIGHT GREAT TOE AMPUTATION;  Surgeon: Newt Minion, MD;  Location: [facility];  Service: Orthopedics;  Laterality: Right;  ? [place]  ? LEFT HEART CATH AND CORONARY ANGIOGRAPHY N/A 02/10/2020  ? Procedure: LEFT HEART CATH AND CORONARY ANGIOGRAPHY;  Surgeon: Troy Sine, MD;  Location: [facility];  Service: Cardiovascular;  Laterality: N/A;  ? PACEMAKER IMPLANT N/A 02/13/2020  ? Procedure: PACEMAKER IMPLANT;  Surgeon: Constance Haw, MD;  Location: [facility];  Service: Cardiovascular;  Laterality: N/A;  ? TEMPORARY PACEMAKER N/A 02/10/2020  ? Procedure: TEMPORARY PACEMAKER;  Surgeon: Troy Sine, MD;  Location: [facility];  Service: Cardiovascular;  Laterality: N/A;  ? ? ?Family History  ?Problem Relation Age of Onset  ? Hypertension Mother   ? Diabetes Mother   ? Heart disease Mother   ? Hypertension Father   ? Diabetes Father   ? Heart disease Father   ? Stroke Maternal Grandfather   ? Other Neg Hx   ? ? ?Social History  ? ?Socioeconomic History  ? Marital status: Significant Other  ?  Spouse name: Not on file  ? Number of children: Not on file  ? Years of education: Not on file  ? Highest education level: Not on file  ?Occupational History  ? Not on file  ?Tobacco Use  ? Smoking status: Former  ?  Packs/day: 0.50  ?  Years: 20.00  ?  Pack years: 10.00  ?   Types: Cigarettes  ?  Quit date: 07/01/2021  ?  Years since quitting: 0.7  ? Smokeless tobacco: Never  ?Vaping Use  ? Vaping Use: Never used  ?Substance and Sexual Activity  ? Alcohol use: No  ? Drug use: No  ? Se

## 2022-03-22 ENCOUNTER — Telehealth: Payer: Self-pay

## 2022-03-22 ENCOUNTER — Ambulatory Visit: Payer: Medicaid Other | Attending: Critical Care Medicine | Admitting: Critical Care Medicine

## 2022-03-22 ENCOUNTER — Other Ambulatory Visit: Payer: Self-pay

## 2022-03-22 ENCOUNTER — Encounter: Payer: Self-pay | Admitting: Critical Care Medicine

## 2022-03-22 VITALS — BP 145/102 | HR 91 | Wt 215.0 lb

## 2022-03-22 DIAGNOSIS — E11621 Type 2 diabetes mellitus with foot ulcer: Secondary | ICD-10-CM

## 2022-03-22 DIAGNOSIS — E1122 Type 2 diabetes mellitus with diabetic chronic kidney disease: Secondary | ICD-10-CM | POA: Diagnosis not present

## 2022-03-22 DIAGNOSIS — F319 Bipolar disorder, unspecified: Secondary | ICD-10-CM | POA: Diagnosis not present

## 2022-03-22 DIAGNOSIS — S0285XA Fracture of orbit, unspecified, initial encounter for closed fracture: Secondary | ICD-10-CM | POA: Insufficient documentation

## 2022-03-22 DIAGNOSIS — I5042 Chronic combined systolic (congestive) and diastolic (congestive) heart failure: Secondary | ICD-10-CM | POA: Diagnosis not present

## 2022-03-22 DIAGNOSIS — Z794 Long term (current) use of insulin: Secondary | ICD-10-CM

## 2022-03-22 DIAGNOSIS — I739 Peripheral vascular disease, unspecified: Secondary | ICD-10-CM | POA: Diagnosis not present

## 2022-03-22 DIAGNOSIS — N181 Chronic kidney disease, stage 1: Secondary | ICD-10-CM

## 2022-03-22 DIAGNOSIS — I10 Essential (primary) hypertension: Secondary | ICD-10-CM | POA: Diagnosis not present

## 2022-03-22 DIAGNOSIS — M869 Osteomyelitis, unspecified: Secondary | ICD-10-CM | POA: Diagnosis not present

## 2022-03-22 DIAGNOSIS — L97529 Non-pressure chronic ulcer of other part of left foot with unspecified severity: Secondary | ICD-10-CM

## 2022-03-22 DIAGNOSIS — I442 Atrioventricular block, complete: Secondary | ICD-10-CM

## 2022-03-22 DIAGNOSIS — E1165 Type 2 diabetes mellitus with hyperglycemia: Secondary | ICD-10-CM

## 2022-03-22 DIAGNOSIS — E1121 Type 2 diabetes mellitus with diabetic nephropathy: Secondary | ICD-10-CM

## 2022-03-22 DIAGNOSIS — Z6836 Body mass index (BMI) 36.0-36.9, adult: Secondary | ICD-10-CM

## 2022-03-22 DIAGNOSIS — Z789 Other specified health status: Secondary | ICD-10-CM

## 2022-03-22 HISTORY — DX: Osteomyelitis, unspecified: M86.9

## 2022-03-22 MED ORDER — LOSARTAN POTASSIUM 25 MG PO TABS
25.0000 mg | ORAL_TABLET | Freq: Every day | ORAL | 3 refills | Status: DC
  Filled 2022-03-22 (×2): qty 90, 90d supply, fill #0

## 2022-03-22 MED ORDER — FUROSEMIDE 20 MG PO TABS
20.0000 mg | ORAL_TABLET | ORAL | 3 refills | Status: DC
  Filled 2022-03-22: qty 45, 90d supply, fill #0

## 2022-03-22 MED ORDER — CARVEDILOL 6.25 MG PO TABS
6.2500 mg | ORAL_TABLET | Freq: Two times a day (BID) | ORAL | 3 refills | Status: DC
  Filled 2022-03-22: qty 60, 30d supply, fill #0

## 2022-03-22 MED ORDER — ATORVASTATIN CALCIUM 20 MG PO TABS
20.0000 mg | ORAL_TABLET | Freq: Every day | ORAL | 3 refills | Status: DC
  Filled 2022-03-22: qty 90, 90d supply, fill #0

## 2022-03-22 NOTE — Assessment & Plan Note (Signed)
The patient is supposed to be on losartan asked to resume this ?

## 2022-03-22 NOTE — Telephone Encounter (Signed)
Met with the patient when she was in the clinic today. I explained that Novella Olive, [place], [facility] has not been able to reach her.  She then provided me with her new phone number which has now been updated in [place] email sent to Novella Olive, [facility] with patient's current phone number. ? ?The patient voiced her frustration with trying to get a job. She has been in contact with [employer] but has not found employment yet.  She was teary when speaking about going through physical therapy, obtaining a prosthesis, wanting to work yet still not being able to get a job.  ? ?She still has questions about obtaining custody of her children; but she said that has not been the focus of her efforts. She is focused on getting a job as she needs money to pay her bills.  ? ?Instructed her to contact me if she does not hear from Novella Olive, [facility]. ? ?

## 2022-03-22 NOTE — Assessment & Plan Note (Signed)
Claims to be abstinent ?

## 2022-03-22 NOTE — Assessment & Plan Note (Signed)
Again I wrote for furosemide to be taken daily and asked the pharmacy to place her on a charge account ?

## 2022-03-22 NOTE — Assessment & Plan Note (Signed)
Not controlled due to lack of access to losartan and carvedilol I resent prescriptions and asked the pharmacy to give these to her and place her co-pay on charge account ?

## 2022-03-22 NOTE — Assessment & Plan Note (Signed)
Continue insulin as prescribed per endocrinology ?

## 2022-03-22 NOTE — Patient Instructions (Signed)
Your oral medications were sent downstairs to our downstairs pharmacy I believe you will be able to charge your co-pay let them know you cannot afford to pay the co-pay today and get all your medicines today please ? ?Keep your follow-ups with endocrinology ? ?Return to see Dr. Joya Gaskins 2 months ? ?Sarah Long our nurse case manager will be speaking to you before you leave regarding your legal aid ? ? ?

## 2022-03-22 NOTE — Assessment & Plan Note (Signed)
Resolved

## 2022-03-22 NOTE — Assessment & Plan Note (Signed)
As per endocrinology ?

## 2022-03-23 ENCOUNTER — Other Ambulatory Visit: Payer: Self-pay

## 2022-03-31 ENCOUNTER — Telehealth: Payer: Self-pay | Admitting: Pharmacist

## 2022-03-31 ENCOUNTER — Other Ambulatory Visit: Payer: Self-pay | Admitting: Critical Care Medicine

## 2022-03-31 ENCOUNTER — Other Ambulatory Visit: Payer: Self-pay | Admitting: Pharmacist

## 2022-03-31 DIAGNOSIS — I10 Essential (primary) hypertension: Secondary | ICD-10-CM

## 2022-03-31 DIAGNOSIS — E1142 Type 2 diabetes mellitus with diabetic polyneuropathy: Secondary | ICD-10-CM

## 2022-03-31 DIAGNOSIS — I739 Peripheral vascular disease, unspecified: Secondary | ICD-10-CM

## 2022-03-31 DIAGNOSIS — I5022 Chronic systolic (congestive) heart failure: Secondary | ICD-10-CM

## 2022-03-31 DIAGNOSIS — I5042 Chronic combined systolic (congestive) and diastolic (congestive) heart failure: Secondary | ICD-10-CM

## 2022-03-31 DIAGNOSIS — E1122 Type 2 diabetes mellitus with diabetic chronic kidney disease: Secondary | ICD-10-CM

## 2022-03-31 MED ORDER — BLOOD PRESSURE MONITOR 7 DEVI: 0 refills | Status: DC

## 2022-03-31 NOTE — Patient Outreach (Signed)
? ?   ? ?Chief Complaint  ?Patient presents with  ? Hypertension  ? ? ?[name] is a 51 y.o. year old female who was referred for medication management by their primary care provider, Sarah Stain, MD. They presented for a telephone visit. ?  ?They were referred to the pharmacist by a quality report for assistance in managing hypertension.  ? ?Subjective: ? ?Care Team: ?Primary Care Provider: [name], MD ; Next Scheduled Visit: not schedukled ?Cardiologist: Sarah Long, referred to [facility]; Next Scheduled Visit: [place] 04/13/22 ?Endocrinologist Sarah Long; Next Scheduled Visit: 06/14/22 ? ?Medication Access/Adherence ? ?Current Pharmacy:  ?[place] [facility] ?[address], [address] ?[place] [address] ?Phone: [phone] Fax: [phone] ? ?[facility] ?[address] ?[place] [address] ?Phone: [phone] Fax: [phone] ? ?[facility] [phone] - [place], [place] [address] [place] ?[address] ?[place] [address] ?Phone: [phone] Fax: [phone] ? ?[place] ([place] [place] [facility] [place] ?[address] ?[place] [address] ?Phone: [phone] Fax: [phone] ? ? ?Patient reports affordability concerns with their medications: Yes  ?Patient reports access/transportation concerns to their pharmacy: No  ?Patient reports adherence concerns with their medications:  Yes  reports she often forgets her evening medication ? ? ?Diabetes: ? ?Current medications: Lantus 50 units daily, Humalog per sliding scale  ?Medications tried in the past: metformin IR - reports diarrhea; hx pancreatitis (3/21 - documented per GI at the time that etiology was unclear, TG were normal) so avoid GLP1 ? ?Using DexCom CGM. Reports readings often in >250s as her high alarm is going off ? ?Hyperlipidemia/ASCVD Risk Reduction ? ?Current  lipid lowering medications: atorvastatin 20 mg - reports she is now back on this ?Medications tried in the past:  ? ?ASCVD History: hx NSTEMI ? ?Hypertension: ? ?Current medications: losartan 25 mg daily, carvedilol 6.25 mg twice daily - though often forgetting evening dose; furosemide 20 mg every other day ? ?Patient does not have a validated, automated, upper arm home BP cuff ? ?Patient denies hypotensive s/sx including dizziness, lightheadedness.  ?Patient denies hypertensive symptoms including headache, chest pain, shortness of breath ? ?Health Maintenance ? ?There are no preventive care reminders to display for this patient.  ? ?Objective: ?Lab Results  ?Component Value Date  ? HGBA1C 7.9 (A) 02/03/2022  ? ? ?Lab Results  ?Component Value Date  ? CREATININE 1.12 (H) 01/28/2022  ? BUN 26 (H) 01/28/2022  ? NA 141 01/28/2022  ? K 4.4 01/28/2022  ? CL 109 (H) 01/28/2022  ? CO2 20 01/28/2022  ? ? ?Lab Results  ?Component Value Date  ? CHOL 98 (L) 06/18/2021  ? HDL 37 (L) 06/18/2021  ? Sarah Long 40 06/18/2021  ? TRIG 117 06/18/2021  ? CHOLHDL 2.6 06/18/2021  ? ? ?Medications Reviewed Today   ? ? Reviewed by Osker Mason, RPH-CPP (Pharmacist) on 03/31/22 at 1303  Med List Status: <None>  ? ?Medication Order Taking? Sig Documenting Provider Last Dose Status Informant  ?atorvastatin (LIPITOR) 20 MG tablet [id number] Yes Take 1 tablet (20 mg total) by mouth daily. Sarah Stain, MD Taking Active   ?carvedilol (COREG) 6.25 MG tablet [id number] Yes Take 1 tablet (6.25 mg total) by mouth 2 (two) times daily with a meal. Sarah Stain, MD Taking Active   ?Continuous Blood Gluc Sensor ([place]) MISC [id number]  1  Device by Does not apply route as directed. Sarah Long, Melanie Crazier, MD  Active   ?Continuous Blood Gluc Transmit (DEXCOM G6 TRANSMITTER) MISC [id number]  1 Device by Does not apply route as directed. Sarah Long, Melanie Crazier, MD  Active   ?furosemide (LASIX) 20 MG tablet [id number] Yes Take 1  tablet (20 mg total) by mouth every other day. Sarah Stain, MD Taking Active   ?insulin glargine (LANTUS SOLOSTAR) 100 UNIT/ML Solostar Pen [id number] Yes Inject 50 Units into the skin daily. Sarah Long, Melanie Crazier, MD Taking Active   ?insulin lispro (HUMALOG) 100 UNIT/ML KwikPen [id number] Yes use as directed (max daily dose: 60 units) Sarah Long, Melanie Crazier, MD Taking Active   ?Insulin Pen Needle 31G X 5 MM MISC [id number] Yes Use as directed in the morning, at noon, in the evening, and at bedtime. Sarah Long, Melanie Crazier, MD Taking Active   ?losartan (COZAAR) 25 MG tablet [id number] Yes Take 1 tablet (25 mg total) by mouth daily. Sarah Stain, MD Taking Active   ?Multiple Vitamins-Minerals (ADULT ONE DAILY GUMMIES PO) [id number] Yes Take 1 capsule by mouth daily. [provider] Taking Active   ? ?  ?  ? ?  ? ? ?Assessment/Plan:  ?Care Plan : Medication Management  ?Updates made by Osker Mason, RPH-CPP since 03/31/2022 12:00 AM  ?  ? ?Problem: Hypertension/CHF   ?  ? ?Goal: BP <130/80   ?Note:   ?Current Barriers:  ?Unable to independently afford treatment regimen ?Does not adhere to prescribed medication regimen ? ?Patient Needs: ?Improved collaboration with dispensing pharmacy ?Working device to evaluate bloodp ressure ? ?Patient Activities: ?Patient will:  ?- check blood pressure daily, document, and provide at future appointments ? ? ?  ? ?Problem: Diabetes   ?  ? ?Goal: A1c <7%   ?Note:   ?Current Barriers:  ?Unable to achieve control of diabetes  ? ?Patient Needs: ?Medication optimization ? ?Patient Activities: ?Patient will:  ?- take medications as prescribed as evidenced by patient report and record review ?check glucose continuously using CGM, document, and provide at future appointments ? ?  ? ? ?Diabetes: ?- Currently uncontrolled ?- Reviewed long term cardiovascular and renal outcomes of uncontrolled blood sugar ?- Reviewed medication history. Patient reports she  did not have diarrhea when on Kombiglyze XR, so we discussed trial of low dose metformin XR 500 mg. Patient is interested. Will collaborate with endocrinology and PCP. ? ?Hypertension: ?- Currently uncontrolled ?- Reviewed long term cardiovascular and renal outcomes of uncontrolled blood pressure ?- Reviewed appropriate blood pressure monitoring technique and reviewed goal blood pressure. Recommended to check home blood pressure and heart rate daily ?- Will collaborate w/ PCP to order BP cuff to [facility] ?- Recommend to consider change to metformin XR to move to once daily medications to improve adherence ?- Will connect with team at [facility] clinic ? ?Hyperlipidemia/ASCVD Risk Reduction: ?- Currently uncontrolled.  ?- Reviewed long term complications of uncontrolled cholesterol ?- Recommend to recheck lipids with next labs, titrate statin as needed to target goal <70 ? ?Care Coordination:  ?- Placed referral for managed medicaid team support ? ?Follow Up Plan: Phone call in ~ 3 weeks ? ?Catie Hedwig Morton, PharmD, BCACP ?[place] ?[phone] ? ? ? ?

## 2022-03-31 NOTE — Telephone Encounter (Signed)
Patient called for TNM documentation. Changing visit type to Managed Medicaid  ?

## 2022-03-31 NOTE — Patient Instructions (Addendum)
Ms. Soberanis,  ? ?It was great talking to you today! ? ?I will reach out to the [facility] regarding the referral from February.  ? ?I will talk to Dr. Joya Gaskins and Dr. Kelton Pillar about trying extended release metformin.  ? ?Here's how you should take your medications:  ? ?Morning: ?- Losartan 25 mg daily - blood pressure/heart protection ?- Atorvastatin 20 mg daily - cholesterol/protection against heart attacks/strokes/amputations ?- Carvedilol 6.25 mg twice daily - blood pressure/heart protection ?- Furosemide 20 mg every other day - fluid pill ? ?Evening: ?- Carvedilol 6.25 mg twice daily - blood pressure/heart protection ? ?Check your blood pressure daily . Our goal is less than 130/80 ? ?We recommend a blood pressure cuff that goes around your upper arm, as these are generally the most accurate. I will talk to Dr. Joya Gaskins about a prescription for this.  ? ?To appropriately check your blood pressure, make sure you do the following:  ?1) Avoid caffeine, exercise, or tobacco products for 30 minutes before checking. ?2) Sit with your back supported in a flat-backed chair. Rest your arm on something flat (arm of the chair, table, etc). ?3) Sit still with your feet flat on the floor, resting, for at least 5 minutes.  ?4) Check your blood pressure. Take 1-2 readings.  ? ?Write down these readings and bring with you to any provider appointments. Bring your home blood pressure machine with you to a provider's office for accuracy comparison at least once a year. Make sure you take your blood pressure medications before you come to any office visit. ? ? ?Let me know if you have any questions or concerns! ? ?Catie Hedwig Morton, PharmD, BCACP ?[place] ?[phone] ? ? ? ?

## 2022-04-01 ENCOUNTER — Telehealth (HOSPITAL_COMMUNITY): Payer: Self-pay | Admitting: Licensed Clinical Social Worker

## 2022-04-01 ENCOUNTER — Telehealth: Payer: Self-pay | Admitting: Pharmacist

## 2022-04-01 ENCOUNTER — Other Ambulatory Visit: Payer: Self-pay

## 2022-04-01 DIAGNOSIS — E1142 Type 2 diabetes mellitus with diabetic polyneuropathy: Secondary | ICD-10-CM

## 2022-04-01 MED ORDER — LANTUS SOLOSTAR 100 UNIT/ML ~~LOC~~ SOPN
60.0000 [IU] | PEN_INJECTOR | Freq: Every day | SUBCUTANEOUS | 6 refills | Status: DC
  Filled 2022-04-01: qty 30, 50d supply, fill #0

## 2022-04-01 NOTE — Telephone Encounter (Signed)
CSW received message asking to assist in getting pt re-connected with HF clinic. ? ?Pt has not been seen in clinic since May 2021 so CSW discussed with clinic staff to see if pt appropriate to bring back to clinic- pt being referred back to HF Clinic following cardiac MRI so staff able to make appt for 5/17 at 2pm ? ?CSW attempted to call pt to discuss appt and assess for SDOH concerns- unable to reach- left VM requesting return call ? ?Jorge Ny, LCSW ?Clinical Social Worker ?[facility] ?Desk#: [phone] ?Cell#: [phone] ? ?

## 2022-04-01 NOTE — Patient Outreach (Signed)
Discussed consideration for addition of metformin XR with Dr. Kelton Pillar. She prefers to discuss with patient at her next appointment. Requested I confirm current insulin dose with patient.  ? ?Contacted patient. Unable to leave voicemail. Will attempt call again this afternoon.  ?

## 2022-04-01 NOTE — Addendum Note (Signed)
Addended by: Kaylyn Layer T on: 04/01/2022 02:10 PM ? ? Modules accepted: Orders ? ?

## 2022-04-01 NOTE — Patient Outreach (Signed)
Called patient back. Advised that BP machine was sent to [facility]. She will call to confirm it was received and filled and she will pick up and start monitoring home readings.  ? ?Discussed glucose. She is taking Lantus 50 units daily and Novolog 12 units + sliding scale per her last AVS before meals. She denies missing any doses before meals, but admits to eating less meals lately while the kids were on Spring Break and she was feeding them.  ? ?Per Dr. Kelton Pillar, advised to increase Lantus to 60 units daily. Script updated under Dr. Kelton Pillar. Patient verbalized understanding.  ? ?Follow up in 2 weeks as previously scheduled.  ? ?Catie Hedwig Morton, PharmD, BCACP ?[place] ?[phone] ? ?

## 2022-04-01 NOTE — Telephone Encounter (Signed)
Called patient. Left voicemail for her to return my call at her convenience.  ? ?Catie Hedwig Morton, PharmD, BCACP ?[place] ?[phone] ? ?

## 2022-04-01 NOTE — Telephone Encounter (Signed)
Pt returned call and CSW informed of upcoming appt with HF Clinic. ? ?CSW inquired if she had and SDOH concerns at this time.  Pt states she is worried about transportation as sometimes her car doesn't work or money is tight for gas.  CSW provided pt with number to her Medicaid transport line so she can set up future rides to medical appts. ? ?No other concerns expressed at this time ? ?Will continue to follow and assist as needed ? ?Jorge Ny, LCSW ?Clinical Social Worker ?[facility] ?Desk#: [phone] ?Cell#: [phone] ? ?

## 2022-04-13 ENCOUNTER — Encounter: Payer: Self-pay | Admitting: Cardiology

## 2022-04-13 ENCOUNTER — Ambulatory Visit (INDEPENDENT_AMBULATORY_CARE_PROVIDER_SITE_OTHER): Payer: Medicaid Other | Admitting: Cardiology

## 2022-04-13 VITALS — BP 126/82 | HR 90 | Ht 64.0 in | Wt 216.0 lb

## 2022-04-13 DIAGNOSIS — I442 Atrioventricular block, complete: Secondary | ICD-10-CM

## 2022-04-13 NOTE — Progress Notes (Signed)
? ?Electrophysiology Office Note ? ? ?Date:  04/13/2022  ? ?ID:  Sarah Long, DOB Nov 13, 1971, MRN [id number] ? ?PCP:  [name], MD  ?Cardiologist:  Radford Pax ?Primary Electrophysiologist: Jahniyah Revere Meredith Leeds, MD   ? ?Chief Complaint: pacemaker ?  ?History of Present Illness: ?Sarah Long is a 51 y.o. female who is being seen today for the evaluation of pacemaker at the request of [name], MD. Presenting today for electrophysiology evaluation. ? ?She has a history significant for diabetes, hypertension, hyperlipidemia, tobacco abuse.  She presented to the hospital March 2021 with complete heart block.  She is status post [employer] Jude dual-chamber pacemaker implanted 02/13/2020. ? ?Today, denies symptoms of palpitations, chest pain, shortness of breath, orthopnea, PND, lower extremity edema, claudication, dizziness, presyncope, syncope, bleeding, or neurologic sequela. The patient is tolerating medications without difficulties.  She is overall feeling well.  She has a myriad of complaints, none of which appear to be related to cardiac issues.  She can walk on flat ground without issue.  She does get mildly short of breath when she lays flat, but otherwise has no major complaints. ? ? ?Past Medical History:  ?Diagnosis Date  ? Abnormal Pap smear 1998  ? Abnormal vaginal bleeding 12/19/2011  ? Acute urinary retention 06/26/2021  ? Arthritis   ? Bacterial infection   ? Bipolar 1 disorder ([place])   ? Blister of second toe of left foot 11/21/2016  ? Candida vaginitis 07/2007  ? Depression   ? recently added wellbutrin-has not taken yet for bipolar  ? Diabetes in pregnancy   ? Diabetes mellitus   ? nph 20U qam and qpm, regular with meals  ? Diabetic ketoacidosis without coma associated with type 2 diabetes mellitus ([place])   ? Fibroid   ? Galactorrhea of right breast 2008  ? GERD (gastroesophageal reflux disease)   ? H/O amenorrhea 07/2007  ? H/O dizziness 10/14/2011  ? H/O dysmenorrhea 2010  ? H/O menorrhagia  10/14/2011  ? H/O varicella   ? Headache(784.0)   ? Heavy vaginal bleeding due to contraceptive injection use 10/12/2011  ? Depo Provera  ? Herpes   ? HSV-2 infection 01/03/2009  ? Hx: UTI (urinary tract infection) 2009  ? Hypertension   ? on aldomet  ? Hypoalbuminemia due to protein-calorie malnutrition ([place]) 06/26/2021  ? Increased BMI 2010  ? Irregular uterine bleeding 04/04/2012  ? Pt has mirena   ? Obesity 10/14/2011  ? Oligomenorrhea 07/2007  ? Osteomyelitis of great toe of right foot ([place]) 03/22/2022  ? Pelvic pain in female   ? Presence of permanent cardiac pacemaker   ? Preterm labor   ? Trichomonas   ? Yeast infection   ? ?Past Surgical History:  ?Procedure Laterality Date  ? ABDOMINAL AORTOGRAM W/LOWER EXTREMITY N/A 06/17/2021  ? Procedure: ABDOMINAL AORTOGRAM W/LOWER EXTREMITY;  Surgeon: Wellington Hampshire, MD;  Location: [facility];  Service: Cardiovascular;  Laterality: N/A;  ? AMPUTATION Left 06/27/2021  ? Procedure: AMPUTATION BELOW KNEE;  Surgeon: Newt Minion, MD;  Location: [facility];  Service: Orthopedics;  Laterality: Left;  ? AMPUTATION Right 09/04/2021  ? Procedure: RIGHT GREAT TOE AMPUTATION;  Surgeon: Newt Minion, MD;  Location: [facility];  Service: Orthopedics;  Laterality: Right;  ? [place]  ? LEFT HEART CATH AND CORONARY ANGIOGRAPHY N/A 02/10/2020  ? Procedure: LEFT HEART CATH AND CORONARY ANGIOGRAPHY;  Surgeon: Troy Sine, MD;  Location: [facility];  Service:  Cardiovascular;  Laterality: N/A;  ? PACEMAKER IMPLANT N/A 02/13/2020  ? Procedure: PACEMAKER IMPLANT;  Surgeon: Constance Haw, MD;  Location: [facility];  Service: Cardiovascular;  Laterality: N/A;  ? TEMPORARY PACEMAKER N/A 02/10/2020  ? Procedure: TEMPORARY PACEMAKER;  Surgeon: Troy Sine, MD;  Location: [facility];  Service: Cardiovascular;  Laterality: N/A;  ? ? ? ?Current Outpatient Medications  ?Medication Sig Dispense Refill  ? acetaminophen (TYLENOL) 325 MG tablet Take 650  mg by mouth every 6 (six) hours as needed (pain).    ? atorvastatin (LIPITOR) 20 MG tablet Take 1 tablet (20 mg total) by mouth daily. 90 tablet 3  ? Blood Pressure Monitoring (BLOOD PRESSURE MONITOR 7) DEVI Measure blood pressure daily 1 each 0  ? carvedilol (COREG) 6.25 MG tablet Take 1 tablet (6.25 mg total) by mouth 2 (two) times daily with a meal. (Patient taking differently: Take 6.25 mg by mouth 2 (two) times daily with a meal. Pt forgets and only taking once daily) 60 tablet 3  ? Continuous Blood Gluc Sensor (DEXCOM G6 SENSOR) MISC 1 Device by Does not apply route as directed. 9 each 3  ? Continuous Blood Gluc Transmit (DEXCOM G6 TRANSMITTER) MISC 1 Device by Does not apply route as directed. 1 each 3  ? furosemide (LASIX) 20 MG tablet Take 1 tablet (20 mg total) by mouth every other day. 90 tablet 3  ? insulin glargine (LANTUS SOLOSTAR) 100 UNIT/ML Solostar Pen Inject 60 Units into the skin daily. 30 mL 6  ? insulin lispro (HUMALOG) 100 UNIT/ML KwikPen use as directed (max daily dose: 60 units) 45 mL 6  ? Insulin Pen Needle 31G X 5 MM MISC Use as directed in the morning, at noon, in the evening, and at bedtime. 400 each 3  ? losartan (COZAAR) 25 MG tablet Take 1 tablet (25 mg total) by mouth daily. 90 tablet 3  ? Multiple Vitamins-Minerals (ADULT ONE DAILY GUMMIES PO) Take 1 capsule by mouth daily.    ? naproxen sodium (ALEVE) 220 MG tablet Take 220 mg by mouth daily as needed (pain).    ? ?No current facility-administered medications for this visit.  ? ? ?Allergies:   Strawberry extract and Sulfa antibiotics  ? ?Social History:  The patient  reports that she quit smoking about 9 months ago. Her smoking use included cigarettes. She has a 10.00 pack-year smoking history. She has never used smokeless tobacco. She reports that she does not drink alcohol and does not use drugs.  ? ?Family History:  The patient's family history includes Diabetes in her father and mother; Heart disease in her father and mother;  Hypertension in her father and mother; Stroke in her maternal grandfather.  ? ?ROS:  Please see the history of present illness.   Otherwise, review of systems is positive for none.   All other systems are reviewed and negative.  ? ?PHYSICAL EXAM: ?VS:  BP 126/82   Pulse 90   Ht _0  (1.626 m)   Wt 216 lb (98 kg)   SpO2 98%   BMI 37.08 kg/m?  , BMI Body mass index is 37.08 kg/m?. ?GEN: Well nourished, well developed, in no acute distress  ?HEENT: normal  ?Neck: no JVD, carotid bruits, or masses ?Cardiac: RRR; no murmurs, rubs, or gallops,no edema  ?Respiratory:  clear to auscultation bilaterally, normal work of breathing ?GI: soft, nontender, nondistended, + BS ?MS: no deformity or atrophy  ?Skin: warm and dry, device site well  healed ?Neuro:  Strength and sensation are intact ?Psych: euthymic mood, full affect ? ?EKG:  EKG is ordered today. ?Personal review of the ekg ordered shows sinus rhythm, V paced ? ?Personal review of the device interrogation today. Results in [place]  ? ? ?Recent Labs: ?06/28/2021: Magnesium 1.7 ?10/07/2021: ALT 6 ?10/14/2021: BNP 285.1 ?01/26/2022: Hemoglobin 10.3; Platelets 241 ?01/28/2022: BUN 26; Creatinine, Ser 1.12; Potassium 4.4; Sodium 141  ? ? ?Lipid Panel  ?   ?Component Value Date/Time  ? CHOL 98 (L) 06/18/2021 1128  ? TRIG 117 06/18/2021 1128  ? HDL 37 (L) 06/18/2021 1128  ? CHOLHDL 2.6 06/18/2021 1128  ? CHOLHDL 3.5 04/28/2020 1511  ? VLDL 25 04/28/2020 1511  ? [place] 40 06/18/2021 1128  ? ? ? ?Wt Readings from Last 3 Encounters:  ?04/13/22 216 lb (98 kg)  ?03/22/22 215 lb (97.5 kg)  ?03/02/22 204 lb (92.5 kg)  ?  ? ? ?Other studies Reviewed: ?Additional studies/ records that were reviewed today include: TTE 02/10/2020 ?Review of the above records today demonstrates:  ? 1. Technically difficult; mild to moderate global reduction in LV  ?systolic function; severe LVH (myocardium with speckeld appearance;  ?suggest further evaluation for amyloid); mild MR.  ? 2. Left ventricular  ejection fraction, by estimation, is 40 to 45%. The  ?left ventricle has mildly decreased function. The left ventricle  ?demonstrates global hypokinesis. There is severe left ventricular  ?hypertrophy. Left vent

## 2022-04-14 ENCOUNTER — Other Ambulatory Visit: Payer: Self-pay | Admitting: Pharmacist

## 2022-04-14 DIAGNOSIS — I10 Essential (primary) hypertension: Secondary | ICD-10-CM

## 2022-04-14 DIAGNOSIS — E1142 Type 2 diabetes mellitus with diabetic polyneuropathy: Secondary | ICD-10-CM

## 2022-04-14 NOTE — Chronic Care Management (AMB) (Signed)
? ?   ? ?Chief Complaint  ?Patient presents with  ? Hypertension  ? ? ?Sarah Long is a 51 y.o. year old female who was referred for medication management by their primary care provider, [name], MD. They presented for a telephone visit. ?  ?They were referred to the pharmacist by a quality report for assistance in managing hypertension.  ? ?Subjective: ? ?Care Team: ?Care Team: ?Primary Care Provider: [name], MD ; Next Scheduled Visit: not scheduled ?Cardiologist: Ursuy/Camnitz, Next Scheduled Visit: [facility] 5/17  ?Endocrinologist Shamleffer; Next Scheduled Visit: 06/14/22 ?PM&R: Raulkar; Next Scheduled Visit: 06/03/22 ?OrthoSharol Given; Next Scheduled Visit: 04/26/22 ? ?Medication Access/Adherence ? ?Current Pharmacy:  ?[place] [facility] ?[address], [address] ?[place] [address] ?Phone: [phone] Fax: [phone] ? ?[facility] ?[address] ?[place] [address] ?Phone: [phone] Fax: [phone] ? ?[facility] [phone] - [place], [place] [address] ?[address] ?[place] [address] ?Phone: [phone] Fax: [phone] ? ?[facility] [facility] [place] [facility] [place] [address] ?[place] [address] ?Phone: [phone] Fax: [phone] ? ?[place], [place] [facility] [place] [address] ?[place] [address] ?Phone: [phone] Fax: [phone] ? ? ?Patient reports affordability concerns with their medications: No  -but does report concerns with finances and housing, particularly with caring for her children ?Patient reports access/transportation concerns to their pharmacy: No  ?Patient reports adherence concerns with their medications:  Yes  reports sometimes forgetting evening carvedilol dose, but working on strategies for improvement ? ? ?Diabetes: ? ?Current  medications: Current medications: Lantus 56 units daily, Humalog per sliding scale  ?Medications tried in the past: metformin IR - reports diarrhea; hx pancreatitis (3/21 - documented per GI at the time that etiology was unclear, TG were normal) so avoid GLP1 ? ? ?Current glucose readings: using DexCom CGM. Reports fasting was 190 this morning, post prandial 250s, though she had a steak, egg, and cheese bagel for breakfast.  ? ?Plan to discuss metformin XR with Dr. Kelton Pillar at next appt in July ? ?Hypertension: ? ?Current medications: losartan 25 mg daily, carvedilol 6.25 mg twice daily - though often forgetting evening dose; furosemide 20 mg every other day ?  ?Patient does now have an automated, upper arm home BP cuff at home - QC brand. She is not confident that it is accurate.  ?  ?Patient denies hypotensive s/sx including dizziness, lightheadedness.  ?Patient denies hypertensive symptoms including headache, chest pain, shortness of breath ? ?Objective: ?Lab Results  ?Component Value Date  ? HGBA1C 7.9 (A) 02/03/2022  ? ? ?Lab Results  ?Component Value Date  ? CREATININE 1.12 (H) 01/28/2022  ? BUN 26 (H) 01/28/2022  ? NA 141 01/28/2022  ? K 4.4 01/28/2022  ? CL 109 (H) 01/28/2022  ? CO2 20 01/28/2022  ? ? ?Lab Results  ?Component Value Date  ? CHOL 98 (L) 06/18/2021  ? HDL 37 (L) 06/18/2021  ? [place] 40 06/18/2021  ? TRIG 117 06/18/2021  ? CHOLHDL 2.6 06/18/2021  ? ? ?Medications Reviewed Today   ? ? Reviewed by Constance Haw, MD (Physician) on 04/13/22 at 1428  Med List Status: <None>  ? ?Medication Order Taking? Sig Documenting Provider Last Dose Status Informant  ?acetaminophen (TYLENOL) 325 MG tablet [id number] Yes Take 650 mg by mouth every 6 (  six) hours as needed (pain). [provider] Taking Active   ?atorvastatin (LIPITOR) 20 MG tablet [id number] Yes Take 1 tablet (20 mg total) by mouth daily. [name], MD Taking Active   ?Blood Pressure Monitoring (BLOOD PRESSURE MONITOR 7)  DEVI [id number] Yes Measure blood pressure daily [name], MD Taking Active   ?carvedilol (COREG) 6.25 MG tablet [id number] Yes Take 1 tablet (6.25 mg total) by mouth 2 (two) times daily with a meal.  ?Patient taking differently: Take 6.25 mg by mouth 2 (two) times daily with a meal. Pt forgets and only taking once daily  ? [name], MD Taking Active   ?Continuous Blood Gluc Sensor (DEXCOM G6 SENSOR) MISC [id number] Yes 1 Device by Does not apply route as directed. Shamleffer, Melanie Crazier, MD Taking Active   ?Continuous Blood Gluc Transmit (DEXCOM G6 TRANSMITTER) MISC [id number] Yes 1 Device by Does not apply route as directed. Shamleffer, Melanie Crazier, MD Taking Active   ?furosemide (LASIX) 20 MG tablet [id number] Yes Take 1 tablet (20 mg total) by mouth every other day. [name], MD Taking Active   ?insulin glargine (LANTUS SOLOSTAR) 100 UNIT/ML Solostar Pen [id number] Yes Inject 60 Units into the skin daily. Shamleffer, Melanie Crazier, MD Taking Active   ?insulin lispro (HUMALOG) 100 UNIT/ML KwikPen [id number] Yes use as directed (max daily dose: 60 units) Shamleffer, Melanie Crazier, MD Taking Active   ?Insulin Pen Needle 31G X 5 MM MISC [id number] Yes Use as directed in the morning, at noon, in the evening, and at bedtime. Shamleffer, Melanie Crazier, MD Taking Active   ?losartan (COZAAR) 25 MG tablet [id number] Yes Take 1 tablet (25 mg total) by mouth daily. [name], MD Taking Active   ?Multiple Vitamins-Minerals (ADULT ONE DAILY GUMMIES PO) [id number] Yes Take 1 capsule by mouth daily. [provider] Taking Active   ?naproxen sodium (ALEVE) 220 MG tablet [id number] Yes Take 220 mg by mouth daily as needed (pain). [provider] Taking Active   ? ?  ?  ? ?  ? ? ?Assessment/Plan:  ?Care Plan : Medication Management  ?Updates made by Osker Mason, RPH-CPP since 04/14/2022 12:00 AM  ?  ? ?Problem: Hypertension/CHF   ?  ? ?Goal: BP <130/80 Completed  04/14/2022  ?Note:   ?Current Barriers:  ?Unable to independently afford treatment regimen ?Does not adhere to prescribed medication regimen ? ?Patient Needs: ?Improved collaboration with dispensing pharmacy ?Working device to evaluate bloodp ressure ? ?Patient Activities: ?Patient will:  ?- check blood pressure daily, document, and provide at future appointments ? ? ? ?  ? ?Problem: Diabetes   ?  ? ?Goal: A1c <7%   ?Note:   ?Current Barriers:  ?Unable to achieve control of diabetes  ? ?Patient Needs: ?Medication optimization ? ?Patient Activities: ?Patient will:  ?- take medications as prescribed as evidenced by patient report and record review ?check glucose continuously using CGM, document, and provide at future appointments ? ? ?  ? ? ?Diabetes: ?- Currently uncontrolled ?- Reviewed dietary modifications including: brainstormed other options for breakfast that are lower in carbohydrates.  ?- Recommended to continue current regimen at this time ? ?Hypertension: ?- Currently controlled at last cardiology appointment. ?- Reviewed long term cardiovascular and renal outcomes of uncontrolled blood pressure ?- Reviewed appropriate blood pressure monitoring technique and reviewed goal blood pressure. Recommended to check home blood pressure and heart rate daily ?- Patient will bring home cuff to [facility] appointment next week for validation ?-  Recommend to continue current regimen at this time ? ?Scheduled for initial call with Managed Medicaid BSW tomorrow morning.  ? ?Follow Up Plan: phone call in 4 weeks ? ?Catie Hedwig Morton, PharmD, BCACP ?[place] ?[phone] ? ? ? ?

## 2022-04-14 NOTE — Patient Instructions (Signed)
Hi Sarah Long,  ? ?Take your blood pressure machine with you to your [facility] appointment next week for comparison.  ? ?Think about some low-carbohydrate options for breakfast.  ? ?Our social worker will call you tomorrow.  ? ?Thanks! ? ?Catie Jodi Mourning, PharmD ? ?Check your blood pressure once daily, and any time you have concerning symptoms like headache, chest pain, dizziness, shortness of breath, or vision changes.  ? ?Our goal is less than 130/80. ? ?To appropriately check your blood pressure, make sure you do the following:  ?1) Avoid caffeine, exercise, or tobacco products for 30 minutes before checking. Empty your bladder. ?2) Sit with your back supported in a flat-backed chair. Rest your arm on something flat (arm of the chair, table, etc). ?3) Sit still with your feet flat on the floor, resting, for at least 5 minutes.  ?4) Check your blood pressure. Take 1-2 readings.  ?5) Write down these readings and bring with you to any provider appointments. ? ?Bring your home blood pressure machine with you to a provider's office for accuracy comparison at least once a year.  ? ?Make sure you take your blood pressure medications before you come to any office visit, even if you were asked to fast for labs. ? ?

## 2022-04-15 ENCOUNTER — Other Ambulatory Visit: Payer: Self-pay

## 2022-04-15 NOTE — Patient Instructions (Signed)
Visit Information ? ?Ms. Chou was given information about Medicaid Managed Care team care coordination services as a part of their [place] Medicaid benefit. [name] verbally consentedto engagement with the [facility] Managed Care team.  ? ?If you are experiencing a medical emergency, please call 911 or report to your local emergency department or urgent care.  ? ?If you have a non-emergency medical problem during routine business hours, please contact your provider's office and ask to speak with a nurse.  ? ?For questions related to your [facility] health plan, please call: [phone]  OR visit the member homepage at: [email or web address] ? ?If you would like to schedule transportation through your [place] Medicaid plan, please call the following number at least 2 days in advance of your appointment: [phone] ? ?If you are experiencing a behavioral health crisis, call the [place] at [phone]). The line is available 24 hours a day, seven days a week. ? ?If you would like help to quit smoking, call 1-800-QUIT-NOW (873) 140-7102) OR Espa?ol: 1-855-D?jelo-Ya 561-209-8947) o para m?s informaci?n haga clic aqu? or Text READY to 200-400 to register via text ? ?Ms. Brzoska - following are the goals we discussed in your visit today:  ? Goals Addressed   ?None ?  ? ? ?Social Worker will follow up in 7 days .  ? ?Mickel Fuchs, BSW, [place] ?[place]  ?High Risk Managed Medicaid Team  ?[phone]  ? ?Following is a copy of your plan of care:  ?There are no care plans that you recently modified to display for this patient. ?  ?

## 2022-04-15 NOTE — Patient Outreach (Signed)
?Medicaid Managed Care ?Social Work Note ? ?04/15/2022 ?Name:  Sarah Long MRN:  [id number] DOB:  [birth year] ? ?VETRA SHINALL is an 51 y.o. year old female who is a primary patient of Joya Gaskins Burnett Harry, MD.  The [facility] Managed Care Coordination team was consulted for assistance with:  Community Resources  ? ?Ms. Smigel was given information about Medicaid Managed Care Coordination team services today. [name] Patient agreed to services and verbal consent obtained. ? ?Engaged with patient  for by telephone forinitial visit in response to referral for case management and/or care coordination services.  ? ?Assessments/Interventions:  Review of past medical history, allergies, medications, health status, including review of consultants reports, laboratory and other test data, was performed as part of comprehensive evaluation and provision of chronic care management services. ? ?SDOH: (Social Determinant of Health) assessments and interventions performed: ?BSW completed telephone outreach with patient. She stated she and children are living in a hotel. Patient has contacted or received help from all resources BSW would offer. Patient is currenlty waiting on a decision for her disablity. Patient receives SSI for her child of 753 monthly and that is the only income they have. Patient stated one of her children does have [employer]. BSW will reach out to [facility] contact to see if any assistance from them. ? ?Advanced Directives Status:  Not addressed in this encounter. ? ?Care Plan ?                ?Allergies  ?Allergen Reactions  ? Strawberry Extract Hives  ? Sulfa Antibiotics Hives and Itching  ? ? ?Medications Reviewed Today   ? ? Reviewed by Constance Haw, MD (Physician) on 04/13/22 at 1428  Med List Status: <None>  ? ?Medication Order Taking? Sig Documenting Provider Last Dose Status Informant  ?acetaminophen (TYLENOL) 325 MG tablet [id number] Yes Take 650 mg by mouth every 6 (six) hours as needed  (pain). [provider] Taking Active   ?atorvastatin (LIPITOR) 20 MG tablet [id number] Yes Take 1 tablet (20 mg total) by mouth daily. [name], MD Taking Active   ?Blood Pressure Monitoring (BLOOD PRESSURE MONITOR 7) DEVI [id number] Yes Measure blood pressure daily [name], MD Taking Active   ?carvedilol (COREG) 6.25 MG tablet [id number] Yes Take 1 tablet (6.25 mg total) by mouth 2 (two) times daily with a meal.  ?Patient taking differently: Take 6.25 mg by mouth 2 (two) times daily with a meal. Pt forgets and only taking once daily  ? [name], MD Taking Active   ?Continuous Blood Gluc Sensor (DEXCOM G6 SENSOR) MISC [id number] Yes 1 Device by Does not apply route as directed. Shamleffer, Melanie Crazier, MD Taking Active   ?Continuous Blood Gluc Transmit (DEXCOM G6 TRANSMITTER) MISC [id number] Yes 1 Device by Does not apply route as directed. Shamleffer, Melanie Crazier, MD Taking Active   ?furosemide (LASIX) 20 MG tablet [id number] Yes Take 1 tablet (20 mg total) by mouth every other day. [name], MD Taking Active   ?insulin glargine (LANTUS SOLOSTAR) 100 UNIT/ML Solostar Pen [id number] Yes Inject 60 Units into the skin daily. Shamleffer, Melanie Crazier, MD Taking Active   ?insulin lispro (HUMALOG) 100 UNIT/ML KwikPen [id number] Yes use as directed (max daily dose: 60 units) Shamleffer, Melanie Crazier, MD Taking Active   ?Insulin Pen Needle 31G X 5 MM MISC [id number] Yes Use as directed in the morning, at noon, in the evening, and at bedtime. Shamleffer, Melanie Crazier, MD Taking Active   ?  losartan (COZAAR) 25 MG tablet [id number] Yes Take 1 tablet (25 mg total) by mouth daily. [name], MD Taking Active   ?Multiple Vitamins-Minerals (ADULT ONE DAILY GUMMIES PO) [id number] Yes Take 1 capsule by mouth daily. [provider] Taking Active   ?naproxen sodium (ALEVE) 220 MG tablet [id number] Yes Take 220 mg by mouth daily as needed (pain). [provider] Taking Active   ? ?  ?  ? ?  ? ? ?Patient Active Problem List  ? Diagnosis Date Noted  ? Peripheral vascular disease, unspecified ([place]) 03/22/2022  ? Orbit fracture, left, closed, initial encounter ([place]) 03/22/2022  ? Dyslipidemia 02/03/2022  ? Type 2 diabetes mellitus with diabetic polyneuropathy, with long-term current use of insulin ([place]) 02/03/2022  ? Type 2 diabetes mellitus with hyperglycemia, with long-term current use of insulin ([place]) 02/03/2022  ? Microalbuminuria 02/03/2022  ? Homelessness 11/19/2021  ? Right great toe amputee ([place]) 11/03/2021  ? Class 2 severe obesity due to excess calories with serious comorbidity and body mass index (BMI) of 36.0 to 36.9 in adult [facility]) 10/15/2021  ? Insomnia 10/07/2021  ? Localized edema 10/07/2021  ? Left below-knee amputee ([place]) 07/01/2021  ? Normocytic anemia 06/26/2021  ? Heart failure with mid-range ejection fraction ([place]) 06/26/2021  ? Diabetic nephropathy with proteinuria ([place]) 06/26/2021  ? Dental caries 04/01/2020  ? Cracked tooth 04/01/2020  ? Alcohol use 03/11/2020  ? Vitamin D deficiency 03/11/2020  ? Prolonged QT interval 02/26/2020  ? Chronic combined systolic and diastolic CHF (congestive heart failure) ([place]) 02/26/2020  ? Heart block AV complete ([place]) 02/11/2020  ? Exocrine pancreatic insufficiency   ? Dysmenorrhea 10/02/2019  ? DM type 2 with diabetic peripheral neuropathy ([place]) 05/30/2019  ? 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 block) 05/29/2019  ? Type 2 diabetes mellitus ([place]) 10/21/2016  ? Former smoker 11/26/2013  ? Hypercholesteremia 02/02/2007  ? Bipolar 1 disorder ([place]) 02/02/2007  ? HYPERTENSION, BENIGN SYSTEMIC 02/02/2007  ? ? ?Conditions to be addressed/monitored per PCP order:   community resources ? ?There are no care plans that you recently modified to display for this patient. ? ? ?Follow up:  Patient agrees to Care Plan and Follow-up. ? ?Plan: The Managed Medicaid care management team will reach out to the  patient again over the next 7 days. ? ?Date/time of next scheduled Social Work care management/care coordination outreach:  04/26/22 ? ?Mickel Fuchs, BSW, [place] ?[place]  ?High Risk Managed Medicaid Team  ?[phone]  ?

## 2022-04-19 NOTE — Progress Notes (Signed)
PCP: [name], MD ?Endocrinology: Dr. Kelton Pillar ?EP: Dr. Curt Bears ?Cardiology: Dr. Aundra Dubin ? ?51 y.o. with history of type 2 diabetes, HTN, hyperlipidemia, complete heart block with PPM, and chronic diastolic CHF was referred by Dr. Joya Gaskins for evaluation of CHF.  Patient has had diabetes since age 12 and has had HTN for at least 5 years.  She is a smoker and has a history of prior ETOH abuse with ETOH pancreatitis. In 3/21, she was admitted with DKA.  She was found to have associated complete heart block that did not resolve, she had St Jude PPM placed.  She had an echocardiogram showing EF 40-45% with severe LV hypertrophy.  She did not have a cardiac MRI prior to PPM placement.  Cath in 3/21 showed no significant CAD (nonischemic cardiomyopathy).  ? ?Last seen in 5/21, doing well but not taking any cardiac meds. Entresto started and started work up for cardiac sarcoidosis and cardiac amyloidosis.  Echo (12/22): EF 70-75%, severe LVH, normal RV, mild to moderate MR. ? ?S/p left BKA 7/22. She has a functioning orthotic and is followed by PMR. ? ?She had follow up with EP 1/23, PPM functioning appropriately. Daily lasix restarted and cMRI arranged. ? ?cMRI (2/23) with LVEF 68%, ECV 54%. Consider amyloid or hypertrophic DCM. No LVOT.  ? ?Today she returns for HF follow up. She was last seen in clinic 5/21. Overall feeling fine. She has minimal dyspnea with walking on flat ground. Denies abnormal bleeding, CP, dizziness, edema, or palpitations. She has chronic orthopnea. Appetite ok. No fever or chills. She does not weigh at home.Has trouble taking meds 2x/day, has tried setting an alarm on her phone. She lives in a hotel w/ her 3 children for the past 3 years. She has a car, she is un-employed. Smokes 1 pack/week, occasional ETOH use, no drugs. ? ?ECG (personally reviewed): NSR, IVCD QRS 172 msec, 79 bpm ? ?St Jude device interrogation: 84% V-paced, 2-4 hours day/activity (personally reviewed). ? ?Labs (4/21):  K 4, creatinine 0.87 ?Labs (2/23): K 4.4, creatinine 1.12 ? ?PMH: ?1. Type 2 diabetes: Diagnosed at age 6.  ?2. HTN ?3. Hyperlipidemia ?4. Active smoker ?5. H/o ETOH pancreatitis ?6. Bipolar disorder ?7. Complete heart block: 3/21, associate with DKA episode.  Has St Jude PPM.  ?8. Chronic systolic CHF: Nonischemic cardiomyopathy.  Echo (3/21) with EF 40-45%, severe LVH, mild MR, normal RV.  ?- LHC (3/21) normal coronaries.  ?- cMRI (2/23): LVEF 68%, ECV 54%. Consider amyloid or hypertrophic DCM. No LVOT.  ?9. S/p left BKA (7/22). Followed by PMR, has Phantom Limb pain. ? ?SH: Divorced, un-employed, prior heavy ETOH now rare, smokes 5 cigarettes daily. ? ?FH: Grandfather with CHF. No known sarcoidosis or amyloidosis.  ? ?ROS: All systems reviewed and negative except as per HPI.  ? ?Current Outpatient Medications  ?Medication Sig Dispense Refill  ? acetaminophen (TYLENOL) 325 MG tablet Take 650 mg by mouth every 6 (six) hours as needed (pain).    ? atorvastatin (LIPITOR) 20 MG tablet Take 1 tablet (20 mg total) by mouth daily. 90 tablet 3  ? Blood Pressure Monitoring (BLOOD PRESSURE MONITOR 7) DEVI Measure blood pressure daily 1 each 0  ? carvedilol (COREG) 6.25 MG tablet Take 1 tablet (6.25 mg total) by mouth 2 (two) times daily with a meal. (Patient taking differently: Take 6.25 mg by mouth 2 (two) times daily with a meal. Pt forgets and only taking once daily) 60 tablet 3  ? Continuous Blood Gluc Sensor (  DEXCOM G6 SENSOR) MISC 1 Device by Does not apply route as directed. 9 each 3  ? Continuous Blood Gluc Transmit (DEXCOM G6 TRANSMITTER) MISC 1 Device by Does not apply route as directed. 1 each 3  ? furosemide (LASIX) 20 MG tablet Take 1 tablet (20 mg total) by mouth every other day. 90 tablet 3  ? insulin glargine (LANTUS SOLOSTAR) 100 UNIT/ML Solostar Pen Inject 60 Units into the skin daily. 30 mL 6  ? insulin lispro (HUMALOG) 100 UNIT/ML KwikPen use as directed (max daily dose: 60 units) 45 mL 6  ? Insulin Pen  Needle 31G X 5 MM MISC Use as directed in the morning, at noon, in the evening, and at bedtime. 400 each 3  ? losartan (COZAAR) 25 MG tablet Take 1 tablet (25 mg total) by mouth daily. 90 tablet 3  ? Multiple Vitamins-Minerals (ADULT ONE DAILY GUMMIES PO) Take 1 capsule by mouth daily.    ? naproxen sodium (ALEVE) 220 MG tablet Take 220 mg by mouth daily as needed (pain).    ? ?No current facility-administered medications for this encounter.  ? ?Wt Readings from Last 3 Encounters:  ?04/21/22 99.3 kg (219 lb)  ?04/13/22 98 kg (216 lb)  ?03/22/22 97.5 kg (215 lb)  ? ?BP 128/70   Pulse 83   Ht '5\' 4"'$  (1.626 m)   Wt 99.3 kg (219 lb)   SpO2 99%   BMI 37.59 kg/m?  ?Physical Exam: ?General:  NAD. No resp difficulty ?HEENT: Normal ?Neck: Supple. JVP 7-8. Carotids 2+ bilat; no bruits. No lymphadenopathy or thryomegaly appreciated. ?Cor: PMI nondisplaced. Regular rate & rhythm. No rubs, gallops or murmurs. ?Lungs: Clear ?Abdomen: Obese,  nontender, nondistended. No hepatosplenomegaly. No bruits or masses. Good bowel sounds. ?Extremities: No cyanosis, clubbing, rash, edema ?Neuro: Alert & oriented x 3, cranial nerves grossly intact. Moves all 4 extremities w/o difficulty. Affect pleasant. ? ?Assessment/Plan: ?1. Chronic systolic CHF: Echo in 1/61 with EF 40-45%, severe LV hypertrophy.  This was found in association with complete heart block.  Nonischemic cardiomyopathy, cath in 3/21 showed no significant CAD.  Etiology still uncertain, may be due to long-standing HTN but association with CHB is concerning for cardiac sarcoidosis or cardiac amyloidosis also.  No known family history of either sarcoidosis or amyloidosis. Given her young age, if amyloidosis is present, it would likely be hereditary TTR. PYP (6/21) not suggestive of TTR amyloidosis. cMRI (2/23): LVEF 68%, ECV 54%. Consider amyloid or hypertrophic DCM. No LVOT. On exam, she is volume volume overloaded.  NYHA class II symptoms.  ?- Increase Lasix to 40 mg daily.  BMET/BNP today, repeat BMET in 10 days. ?- I will hold off on starting spiro today as I am unsure she will return for regular labs. ?- Continue Coreg 6.25 mg bid. ?- Continue losartan 25 mg daily. ?- Would avoid SGLT2i as she has history of DKA ?- Myeloma panel, urine immunofixation, negative. PYP scan (6/21) not suggestive of TTR amyloid. Discussed with Dr. Aundra Dubin, will repeat myeloma panel and arrange for [facility] genetic testing for TTR and cardiomyopathies (including hypertrophic cardiomyopathy). We will need to coordinate urine immunofixation. ?- Repeat PYP scan.  ?- Arrange for high resolution CT chest to assess for any sign of pulmonary sarcoidosis.  She will need ACE level done.   ?- If further concern for sarcoidosis remains, could consider referral out for cardiac PET.  ?2. HTN: Controlled. GDMT as above.   ?3. Hyperlipidemia: Check lipids today.  ?4. Active smoker: I strongly  urged her to quit.    ?5. Type 2 diabetes: Followed by Endocrinology. She is on insulin. ?- Most recent A1C < 7.0 ?6. Complete heart block: She has a St Jude PPM and is pacing her RV > 99% of the time.  As above, workup for cardiac sarcoidosis and cardiac amyloidosis as underlying cause.  ?7. SDOH: Lives in hotel x 3 years with 3 out of 4 of her children. She is un-employed. Has transportation, has difficulty taking medications consistently. ?- Engage HFSW, arrange for paramedicine.  ?8. Obesity: Body mass index is 37.59 kg/m?. ?- Consider referral for GLP1RA, but has history of pancreatitis. ? ?Follow up in 3 weeks with PharmD (add spiro if able) and 8 weeks with Dr. Aundra Dubin. ? ?Rafael Bihari FNP-BC ?04/21/2022 ? ?

## 2022-04-21 ENCOUNTER — Encounter (HOSPITAL_COMMUNITY): Payer: Self-pay

## 2022-04-21 ENCOUNTER — Ambulatory Visit (HOSPITAL_COMMUNITY)
Admission: RE | Admit: 2022-04-21 | Discharge: 2022-04-21 | Disposition: A | Discharge status: Home / Self Care | Payer: Medicaid Other | Source: Ambulatory Visit | Attending: Family Medicine | Admitting: Family Medicine

## 2022-04-21 ENCOUNTER — Other Ambulatory Visit: Payer: Self-pay

## 2022-04-21 ENCOUNTER — Telehealth (HOSPITAL_COMMUNITY): Payer: Self-pay

## 2022-04-21 ENCOUNTER — Encounter: Payer: Self-pay | Admitting: Internal Medicine

## 2022-04-21 VITALS — BP 128/70 | HR 83 | Ht 64.0 in | Wt 219.0 lb

## 2022-04-21 DIAGNOSIS — Z950 Presence of cardiac pacemaker: Secondary | ICD-10-CM | POA: Diagnosis not present

## 2022-04-21 DIAGNOSIS — E669 Obesity, unspecified: Secondary | ICD-10-CM | POA: Diagnosis not present

## 2022-04-21 DIAGNOSIS — Z560 Unemployment, unspecified: Secondary | ICD-10-CM | POA: Insufficient documentation

## 2022-04-21 DIAGNOSIS — Z139 Encounter for screening, unspecified: Secondary | ICD-10-CM

## 2022-04-21 DIAGNOSIS — I442 Atrioventricular block, complete: Secondary | ICD-10-CM | POA: Insufficient documentation

## 2022-04-21 DIAGNOSIS — Z5901 Sheltered homelessness: Secondary | ICD-10-CM | POA: Insufficient documentation

## 2022-04-21 DIAGNOSIS — F17210 Nicotine dependence, cigarettes, uncomplicated: Secondary | ICD-10-CM | POA: Insufficient documentation

## 2022-04-21 DIAGNOSIS — E785 Hyperlipidemia, unspecified: Secondary | ICD-10-CM | POA: Diagnosis not present

## 2022-04-21 DIAGNOSIS — Z6837 Body mass index (BMI) 37.0-37.9, adult: Secondary | ICD-10-CM | POA: Diagnosis not present

## 2022-04-21 DIAGNOSIS — Z794 Long term (current) use of insulin: Secondary | ICD-10-CM | POA: Diagnosis not present

## 2022-04-21 DIAGNOSIS — Z8719 Personal history of other diseases of the digestive system: Secondary | ICD-10-CM | POA: Insufficient documentation

## 2022-04-21 DIAGNOSIS — Z720 Tobacco use: Secondary | ICD-10-CM

## 2022-04-21 DIAGNOSIS — I5022 Chronic systolic (congestive) heart failure: Secondary | ICD-10-CM | POA: Insufficient documentation

## 2022-04-21 DIAGNOSIS — I10 Essential (primary) hypertension: Secondary | ICD-10-CM

## 2022-04-21 DIAGNOSIS — E1169 Type 2 diabetes mellitus with other specified complication: Secondary | ICD-10-CM | POA: Diagnosis not present

## 2022-04-21 DIAGNOSIS — E119 Type 2 diabetes mellitus without complications: Secondary | ICD-10-CM | POA: Insufficient documentation

## 2022-04-21 DIAGNOSIS — I5042 Chronic combined systolic (congestive) and diastolic (congestive) heart failure: Secondary | ICD-10-CM

## 2022-04-21 DIAGNOSIS — I110 Hypertensive heart disease with heart failure: Secondary | ICD-10-CM | POA: Diagnosis not present

## 2022-04-21 LAB — CBC
HCT: 29.4 % — ABNORMAL LOW (ref 36.0–46.0)
Hemoglobin: 9.5 g/dL — ABNORMAL LOW (ref 12.0–15.0)
MCH: 28.2 pg (ref 26.0–34.0)
MCHC: 32.3 g/dL (ref 30.0–36.0)
MCV: 87.2 fL (ref 80.0–100.0)
Platelets: 215 10*3/uL (ref 150–400)
RBC: 3.37 MIL/uL — ABNORMAL LOW (ref 3.87–5.11)
RDW: 15.8 % — ABNORMAL HIGH (ref 11.5–15.5)
WBC: 7.3 10*3/uL (ref 4.0–10.5)
nRBC: 0 % (ref 0.0–0.2)

## 2022-04-21 LAB — BASIC METABOLIC PANEL
Anion gap: 8 (ref 5–15)
BUN: 24 mg/dL — ABNORMAL HIGH (ref 6–20)
CO2: 20 mmol/L — ABNORMAL LOW (ref 22–32)
Calcium: 8.9 mg/dL (ref 8.9–10.3)
Chloride: 110 mmol/L (ref 98–111)
Creatinine, Ser: 1.13 mg/dL — ABNORMAL HIGH (ref 0.44–1.00)
GFR, Estimated: >60 mL/min (ref >60)
Glucose, Bld: 159 mg/dL — ABNORMAL HIGH (ref 70–99)
Potassium: 3.9 mmol/L (ref 3.5–5.1)
Sodium: 138 mmol/L (ref 135–145)

## 2022-04-21 LAB — LIPID PANEL
Cholesterol: 123 mg/dL (ref 0–200)
HDL: 42 mg/dL (ref >40)
LDL Cholesterol: 53 mg/dL (ref 0–99)
Total CHOL/HDL Ratio: 2.9 RATIO
Triglycerides: 141 mg/dL (ref <150)
VLDL: 28 mg/dL (ref 0–40)

## 2022-04-21 LAB — BRAIN NATRIURETIC PEPTIDE: B Natriuretic Peptide: 405.2 pg/mL — ABNORMAL HIGH (ref 0.0–100.0)

## 2022-04-21 LAB — HEMOGLOBIN A1C
Hgb A1c MFr Bld: 8.7 % — ABNORMAL HIGH (ref 4.8–5.6)
Mean Plasma Glucose: 202.99 mg/dL

## 2022-04-21 MED ORDER — FUROSEMIDE 20 MG PO TABS
40.0000 mg | ORAL_TABLET | Freq: Every day | ORAL | 3 refills | Status: DC
Start: 2022-04-21 — End: 2022-05-06
  Filled 2022-04-21: qty 180, 90d supply, fill #0

## 2022-04-21 NOTE — Telephone Encounter (Signed)
Called and left patient a detailed message to confirm/remind patient of their appointment at the [facility] on 04/21/22.  ? ?

## 2022-04-21 NOTE — Patient Instructions (Addendum)
Thank you for coming in today ? ?You have been ordered for Hi resolution CT of the chest and a PYP scan you will be called for further appointment details. ? ?You have had blood work for genetic testing ? ?You have been referred to paramedicine ? ?INCREASE Lasix to 40 mg daily  ? ?Your physician recommends that you return for lab work in:  10 days  ? ?Your physician recommends that you schedule a follow-up appointment in:  ?4 weeks in clinic  ?2-3 months with Dr. Aundra Dubin ? ?At the [facility], you and your health needs are our priority. As part of our continuing mission to provide you with exceptional heart care, we have created designated Provider Care Teams. These Care Teams include your primary Cardiologist (physician) and Advanced Practice Providers (APPs- Physician Assistants and Nurse Practitioners) who all work together to provide you with the care you need, when you need it.  ? ?You may see any of the following providers on your designated Care Team at your next follow up: ?Dr Glori Bickers ?Dr Loralie Champagne ?Darrick Grinder, NP ?Lyda Jester, PA ?Jessica Milford,NP ?Marlyce Huge, PA ?Audry Riles, PharmD ? ? ?Please be sure to bring in all your medications bottles to every appointment.  ? ?If you have any questions or concerns before your next appointment please send [place] a message through [place] or call our office at [phone].   ? ?TO LEAVE A MESSAGE FOR THE NURSE SELECT OPTION 2, PLEASE LEAVE A MESSAGE INCLUDING: ?YOUR NAME ?DATE OF BIRTH ?CALL BACK NUMBER ?REASON FOR CALL**this is important as we prioritize the call backs ? ?YOU WILL RECEIVE A CALL BACK THE SAME DAY AS LONG AS YOU CALL BEFORE 4:00 PM ? ?

## 2022-04-21 NOTE — Progress Notes (Signed)
?Heart and Vascular Care Navigation ? ?04/21/2022 ? ?Sarah Long ?1971-11-24 ?[id number] ? ?Reason for Referral: paramedicine enrollment ?  ?Engaged with patient face to face for initial visit for Heart and Vascular Care Coordination. ?                                                                                                  ? ?Paramedicine Initial Assessment: ? ?Housing:  ?In what kind of housing do you live? House/apt/trailer/shelter? Hotel room ? ?Do you rent/pay a mortgage/own? $900 a month ? ?Do you live with anyone? Three minor children (10, 12?, and 16) ? ?Are you currently worried about losing your housing? Yes- hard to pay for $900/month at this time.  Has been staying in touch with local family shelter- [facility] ? ? ?Social:  ?What is your current marital status? divorced ? ?Do you have any children? 4 children- 3 minor children and 1 adult child who works at [employer] long ? ?Do you have family or friends who live locally? Good relationship with church they have been helping her with housing expenses. ? ?Food:  ?Gets food stamps- sufficient during school year when getting lunches but hard when out of school.  Is very savvy with local resources- pantries and hot meal locations.  Pantries sometimes hard to access due to limited food storage at hotel. ? ?Income:  ?What is your current source of income? Son getting about $700 in survivors benefit from his fathers passing.  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 ? ?Insurance:  ?Are you currently insured? Yes- get medicaid ? ?Do you have prescription coverage? yes ? ?If no insurance, have you applied for coverage (Medicaid, disability, marketplace etc)? Disability pending- submitted in February ? ?Transportation:  ?Do you have transportation to your medical appointments? Has a Lucianne Lei but not reliable- also has medicaid transport but hasn't tried  it yet. ? ? Daily Health Needs: ?Do you have a working scale at home? Yes- but has to locate it. ? ?Do you have issues affording your medications? Sometimes hard due to other expenses- informed pt of [facility] which can deliver meds and waive copay costs for medicaid recipients  ? ?If yes, has this ever prevented you from obtaining medications? ? ?Do you have any concerns with mobility at home? Has leg amputation but uses prosthetic but reports no problems completeing ADLs ? ?Do you have a PCP? Asencion Noble ? ?Are there any additional barriers you see to getting the care you need? Mentioned issues getting clothing- referred to [facility] [employer] ?                                   ? ?HRT/VAS Care Coordination   ? ? Living arrangements for the past 2 months Hotel/Motel  ? Lives with: Adult Children; Minor Children  ? Patient Current Insurance Coverage Medicaid  ? Patient Has Concern With Paying Medical Bills No  ?  Does Patient Have Prescription Coverage? Yes  ? Home Assistive Devices/Equipment Wheelchair; Environmental consultant (specify type); CBG Meter  ? DME Agency [employer]  ? [place] [facility])  ? ?  ? ? ?Social History:                                                                             ?SDOH Screenings  ? ?Alcohol Screen: Not on file  ?Depression (PHQ2-9): Medium Risk  ? PHQ-2 Score: 12  ?Financial Resource Strain: High Risk  ? Difficulty of Paying Living Expenses: Hard  ?Food Insecurity: No Food Insecurity  ? Worried About Charity fundraiser in the Last Year: Never true  ? Ran Out of Food in the Last Year: Never true  ?Housing: Medium Risk  ? Last Housing Risk Score: 1  ?Physical Activity: Not on file  ?Social Connections: Not on file  ?Stress: Not on file  ?Tobacco Use: Medium Risk  ? Smoking Tobacco Use: Former  ? Smokeless Tobacco Use: Never  ? Passive Exposure: Not on file  ?Transportation Needs: Unmet Transportation Needs  ? Lack of Transportation (Medical): Yes  ? Lack of  Transportation (Non-Medical): Yes  ? ? ?SDOH Interventions: ?Financial Resources:    ?Social Security for Disability application assistance has pending application through [employer]- submitted in Feb. Per patient  ?Food Insecurity:   Gets food stamps and very aware of local pantries and hot meal options  ?Housing Insecurity:   Lives in [place] with 3 minor children- having trouble making $900 monthly costs at this time  ?Transportation:    Has a car and has access to Medicaid transport when needed  ? ?Follow-up plan:   ? ? ?CSW will continue to follow through paramedicine program and assist as needed. ? ? ?Jorge Ny, LCSW ?Clinical Social Worker ?[facility] ?Desk#: [phone] ?Cell#: [phone] ? ?

## 2022-04-22 ENCOUNTER — Telehealth (HOSPITAL_COMMUNITY): Payer: Self-pay | Admitting: Cardiology

## 2022-04-22 ENCOUNTER — Other Ambulatory Visit: Payer: Self-pay

## 2022-04-22 MED ORDER — POTASSIUM CHLORIDE CRYS ER 20 MEQ PO TBCR
20.0000 meq | EXTENDED_RELEASE_TABLET | Freq: Every day | ORAL | 3 refills | Status: DC
  Filled 2022-04-22: qty 90, 90d supply, fill #0

## 2022-04-22 NOTE — Telephone Encounter (Signed)
Patient called.  Patient aware.  

## 2022-04-22 NOTE — Telephone Encounter (Signed)
-----   Message from Rafael Bihari, [place] sent at 04/21/2022  4:43 PM EDT ----- Please start 20 KCL daily as we increased Lasix today. She has repeat labs arranged

## 2022-04-26 ENCOUNTER — Encounter: Payer: Self-pay | Admitting: Orthopedic Surgery

## 2022-04-26 ENCOUNTER — Other Ambulatory Visit: Payer: Self-pay

## 2022-04-26 ENCOUNTER — Ambulatory Visit (INDEPENDENT_AMBULATORY_CARE_PROVIDER_SITE_OTHER): Payer: Medicaid Other | Admitting: Orthopedic Surgery

## 2022-04-26 DIAGNOSIS — M6701 Short Achilles tendon (acquired), right ankle: Secondary | ICD-10-CM | POA: Diagnosis not present

## 2022-04-26 DIAGNOSIS — Z89512 Acquired absence of left leg below knee: Secondary | ICD-10-CM | POA: Diagnosis not present

## 2022-04-26 DIAGNOSIS — Z89411 Acquired absence of right great toe: Secondary | ICD-10-CM

## 2022-04-26 NOTE — Progress Notes (Signed)
Office Visit Note   [name]           Date of Birth: 01/23/1971           MRN: [id number] Visit Date: 04/26/2022              Requested by: [name], MD [address] [place],  [place] [address] PCP: [name], MD  Chief Complaint  Patient presents with   Left Leg - Follow-up    Hx left BKA 06/2021   Right Foot - Follow-up    Hx right GT amp 09/04/2021      HPI: Patient is a 52 year old woman who presents in follow-up for 3 separate issues.  #1 left transtibial amputation has had a socket modified with [employer].  #2 Achilles contracture on the right.  #3 history of ulcerations right foot.  Assessment & Plan: Visit Diagnoses:  1. Left below-knee amputee ([place])   2. Right great toe amputee ([place])   3. Achilles tendon contracture, right     Plan: Patient will continue with her prosthesis currently no symptoms at this time continue with Achilles stretching on the right discussed there is recurrent ulcerations on the right foot we would need to consider a gastrocnemius recession.  Follow-Up Instructions: Return if symptoms worsen or fail to improve.   Ortho Exam  Patient is alert, oriented, no adenopathy, well-dressed, normal affect, normal respiratory effort. Examination patient has had her socket liner and liner liner modified she currently has a good prosthetic fit.  Examination of the right foot she has dorsiflexion to neutral with Achilles tightness this appears to be getting better.  There are no ulcers on the right foot.  No calluses.  Imaging: No results found. No images are attached to the encounter.  Labs: Lab Results  Component Value Date   HGBA1C 8.7 (H) 04/21/2022   HGBA1C 7.9 (A) 02/03/2022   HGBA1C 7.4 (A) 10/14/2021   LABURIC 4.1 10/21/2016   LABURIC 4.2 11/20/2010   REPTSTATUS 12/17/2021 FINAL 12/15/2021   GRAMSTAIN NO WBC SEEN NO ORGANISMS SEEN  08/19/2021   CULT >=100,000 COLONIES/mL ESCHERICHIA COLI (A)  12/15/2021   LABORGA ESCHERICHIA COLI (A) 12/15/2021     Lab Results  Component Value Date   ALBUMIN 3.8 10/07/2021   ALBUMIN 3.4 (L) 07/30/2021   ALBUMIN 2.2 (L) 07/13/2021    Lab Results  Component Value Date   MG 1.7 06/28/2021   MG 1.8 06/27/2021   MG 1.7 06/27/2021   Lab Results  Component Value Date   VD25OH 24.1 (L) 03/11/2020   VD25OH <4.20 (L) 02/15/2020    No results found for: PREALBUMIN    Latest Ref Rng & Units 04/21/2022    2:42 PM 01/26/2022   12:23 PM 09/04/2021    9:23 AM  CBC EXTENDED  WBC 4.0 - 10.5 K/uL 7.3   6.9   7.5    RBC 3.87 - 5.11 MIL/uL 3.37   3.78   3.69    Hemoglobin 12.0 - 15.0 g/dL 9.5   10.3   9.9    HCT 36.0 - 46.0 % 29.4   32.3   31.7    Platelets 150 - 400 K/uL 215   241   224       There is no height or weight on file to calculate BMI.  Orders:  No orders of the defined types were placed in this encounter.  No orders of the defined types were placed  in this encounter.    Procedures: No procedures performed  Clinical Data: No additional findings.  ROS:  All other systems negative, except as noted in the HPI. Review of Systems  Objective: Vital Signs: There were no vitals taken for this visit.  Specialty Comments:  No specialty comments available.  PMFS History: Patient Active Problem List   Diagnosis Date Noted   Peripheral vascular disease, unspecified ([place]) 03/22/2022   Orbit fracture, left, closed, initial encounter ([place]) 03/22/2022   Dyslipidemia 02/03/2022   Type 2 diabetes mellitus with diabetic polyneuropathy, with long-term current use of insulin ([place]) 02/03/2022   Type 2 diabetes mellitus with hyperglycemia, with long-term current use of insulin ([place]) 02/03/2022   Microalbuminuria 02/03/2022   Homelessness 11/19/2021   Right great toe amputee ([place]) 11/03/2021   Class 2 severe obesity due to excess calories with serious comorbidity and body mass index (BMI) of 36.0 to 36.9 in adult [facility]) 10/15/2021    Insomnia 10/07/2021   Localized edema 10/07/2021   Left below-knee amputee ([place]) 07/01/2021   Normocytic anemia 06/26/2021   Heart failure with mid-range ejection fraction ([place]) 06/26/2021   Diabetic nephropathy with proteinuria ([place]) 06/26/2021   Dental caries 04/01/2020   Cracked tooth 04/01/2020   Alcohol use 03/11/2020   Vitamin D deficiency 03/11/2020   Prolonged QT interval 02/26/2020   Chronic combined systolic and diastolic CHF (congestive heart failure) ([place]) 02/26/2020   Heart block AV complete ([place]) 02/11/2020   Exocrine pancreatic insufficiency    Dysmenorrhea 10/02/2019   DM type 2 with diabetic peripheral neuropathy ([place]) 05/30/2019   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 block) 05/29/2019   Type 2 diabetes mellitus ([place]) 10/21/2016   Former smoker 11/26/2013   Hypercholesteremia 02/02/2007   Bipolar 1 disorder ([place]) 02/02/2007   HYPERTENSION, BENIGN SYSTEMIC 02/02/2007   Past Medical History:  Diagnosis Date   Abnormal Pap smear 1998   Abnormal vaginal bleeding 12/19/2011   Acute urinary retention 06/26/2021   Arthritis    Bacterial infection    Bipolar 1 disorder ([place])    Blister of second toe of left foot 11/21/2016   Candida vaginitis 07/2007   Depression    recently added wellbutrin-has not taken yet for bipolar   Diabetes in pregnancy    Diabetes mellitus    nph 20U qam and qpm, regular with meals   Diabetic ketoacidosis without coma associated with type 2 diabetes mellitus ([place])    Fibroid    Galactorrhea of right breast 2008   GERD (gastroesophageal reflux disease)    H/O amenorrhea 07/2007   H/O dizziness 10/14/2011   H/O dysmenorrhea 2010   H/O menorrhagia 10/14/2011   H/O varicella    Headache(784.0)    Heavy vaginal bleeding due to contraceptive injection use 10/12/2011   Depo Provera   Herpes    HSV-2 infection 01/03/2009   Hx: UTI (urinary tract infection) 2009   Hypertension    on aldomet   Hypoalbuminemia due  to protein-calorie malnutrition ([place]) 06/26/2021   Increased BMI 2010   Irregular uterine bleeding 04/04/2012   Pt has mirena    Obesity 10/14/2011   Oligomenorrhea 07/2007   Osteomyelitis of great toe of right foot ([place]) 03/22/2022   Pelvic pain in female    Presence of permanent cardiac pacemaker    Preterm labor    Trichomonas    Yeast infection     Family History  Problem Relation Age of Onset   Hypertension Mother    Diabetes  Mother    Heart disease Mother    Hypertension Father    Diabetes Father    Heart disease Father    Stroke Maternal Grandfather    Other Neg Hx     Past Surgical History:  Procedure Laterality Date   ABDOMINAL AORTOGRAM W/LOWER EXTREMITY N/A 06/17/2021   Procedure: ABDOMINAL AORTOGRAM W/LOWER EXTREMITY;  Surgeon: Wellington Hampshire, MD;  Location: [facility];  Service: Cardiovascular;  Laterality: N/A;   AMPUTATION Left 06/27/2021   Procedure: AMPUTATION BELOW KNEE;  Surgeon: Newt Minion, MD;  Location: [facility];  Service: Orthopedics;  Laterality: Left;   AMPUTATION Right 09/04/2021   Procedure: RIGHT GREAT TOE AMPUTATION;  Surgeon: Newt Minion, MD;  Location: [facility];  Service: Orthopedics;  Laterality: Right;   CESAREAN SECTION  1991   LEFT HEART CATH AND CORONARY ANGIOGRAPHY N/A 02/10/2020   Procedure: LEFT HEART CATH AND CORONARY ANGIOGRAPHY;  Surgeon: Troy Sine, MD;  Location: [facility];  Service: Cardiovascular;  Laterality: N/A;   PACEMAKER IMPLANT N/A 02/13/2020   Procedure: PACEMAKER IMPLANT;  Surgeon: Constance Haw, MD;  Location: [facility];  Service: Cardiovascular;  Laterality: N/A;   TEMPORARY PACEMAKER N/A 02/10/2020   Procedure: TEMPORARY PACEMAKER;  Surgeon: Troy Sine, MD;  Location: [facility];  Service: Cardiovascular;  Laterality: N/A;   Social History   Occupational History   Not on file  Tobacco Use   Smoking status: Former    Packs/day: 0.50    Years: 20.00    Pack years: 10.00     Types: Cigarettes    Quit date: 07/01/2021    Years since quitting: 0.8   Smokeless tobacco: Never  Vaping Use   Vaping Use: Never used  Substance and Sexual Activity   Alcohol use: No   Drug use: No   Sexual activity: Yes    Birth control/protection: None

## 2022-04-27 LAB — MULTIPLE MYELOMA PANEL, SERUM
Albumin SerPl Elph-Mcnc: 2.9 g/dL (ref 2.9–4.4)
Albumin/Glob SerPl: 0.9 (ref 0.7–1.7)
Alpha 1: 0.3 g/dL (ref 0.0–0.4)
Alpha2 Glob SerPl Elph-Mcnc: 0.9 g/dL (ref 0.4–1.0)
B-Globulin SerPl Elph-Mcnc: 1 g/dL (ref 0.7–1.3)
Gamma Glob SerPl Elph-Mcnc: 1.2 g/dL (ref 0.4–1.8)
Globulin, Total: 3.4 g/dL (ref 2.2–3.9)
IgA: 271 mg/dL (ref 87–352)
IgG (Immunoglobin G), Serum: 1209 mg/dL (ref 586–1602)
IgM (Immunoglobulin M), Srm: 90 mg/dL (ref 26–217)
Total Protein ELP: 6.3 g/dL (ref 6.0–8.5)

## 2022-04-29 ENCOUNTER — Telehealth (HOSPITAL_COMMUNITY): Payer: Self-pay | Admitting: *Deleted

## 2022-04-29 ENCOUNTER — Ambulatory Visit: Payer: Self-pay

## 2022-04-29 NOTE — Telephone Encounter (Signed)
Called pt and left detailed vm.

## 2022-04-29 NOTE — Telephone Encounter (Signed)
Pt left vm c/o high blood pressure 204/115 yesterday today is 165/95, bad headache last night. Patient denies chest pain, blurred vision,difficulty breathing, weakness. Pt said she is taking all of her medications as directed.  Routed to [employer] for advice

## 2022-04-29 NOTE — Telephone Encounter (Signed)
  Chief Complaint: high BP Symptoms: 204/115 yesterday today is 165/95, bad headache last night Frequency: yesterday  Pertinent Negatives: Patient denies chest pain, blurred vision,difficulty breathing, weakness. Disposition: '[]'$ ED /'[]'$ Urgent Care (no appt availability in office) / '[]'$ Appointment(In office/virtual)/ '[]'$  [facility]/ '[]'$ Home Care/ '[]'$ Refused Recommended Disposition /'[]'$ [facility]/ '[x]'$  Follow-up with PCP Additional Notes: unable to find appt with Dr Joya Gaskins or other  providers. Pt stated she will call her heart failure doctor and will call back if needed.     Reason for Disposition  Systolic BP  >= 703 OR Diastolic >= 403    Pt has headache (severe) last night  Answer Assessment - Initial Assessment Questions 1. BLOOD PRESSURE: "What is the blood pressure?" "Did you take at least two measurements 5 minutes apart?"     204/115 yesterday  165/95 0700 2. ONSET: "When did you take your blood pressure?"     This am 3. HOW: "How did you obtain the blood pressure?" (e.g., visiting nurse, automatic home BP monitor)     Automatic BP monitor 4. HISTORY: "Do you have a history of high blood pressure?"     yes 5. MEDICATIONS: "Are you taking any medications for blood pressure?" "Have you missed any doses recently?"     Yes-no 6. OTHER SYMPTOMS: "Do you have any symptoms?" (e.g., headache, chest pain, blurred vision, difficulty breathing, weakness)     Bad headache last night, sugar 81,  Protocols used: Blood Pressure - High-A-AH

## 2022-05-04 ENCOUNTER — Ambulatory Visit (HOSPITAL_COMMUNITY)
Admission: RE | Admit: 2022-05-04 | Discharge: 2022-05-04 | Disposition: A | Discharge status: Home / Self Care | Payer: Medicaid Other | Source: Ambulatory Visit | Attending: Cardiology | Admitting: Cardiology

## 2022-05-04 ENCOUNTER — Telehealth (HOSPITAL_COMMUNITY): Payer: Self-pay | Admitting: Emergency Medicine

## 2022-05-04 DIAGNOSIS — I5042 Chronic combined systolic (congestive) and diastolic (congestive) heart failure: Secondary | ICD-10-CM | POA: Insufficient documentation

## 2022-05-04 LAB — BASIC METABOLIC PANEL
Anion gap: 6 (ref 5–15)
BUN: 22 mg/dL — ABNORMAL HIGH (ref 6–20)
CO2: 21 mmol/L — ABNORMAL LOW (ref 22–32)
Calcium: 9.1 mg/dL (ref 8.9–10.3)
Chloride: 109 mmol/L (ref 98–111)
Creatinine, Ser: 1.14 mg/dL — ABNORMAL HIGH (ref 0.44–1.00)
GFR, Estimated: 59 mL/min — ABNORMAL LOW (ref >60)
Glucose, Bld: 121 mg/dL — ABNORMAL HIGH (ref 70–99)
Potassium: 5.2 mmol/L — ABNORMAL HIGH (ref 3.5–5.1)
Sodium: 136 mmol/L (ref 135–145)

## 2022-05-04 NOTE — Telephone Encounter (Signed)
Called & got LVM.  @ 11:29

## 2022-05-05 ENCOUNTER — Telehealth (HOSPITAL_COMMUNITY): Payer: Self-pay | Admitting: Surgery

## 2022-05-05 NOTE — Telephone Encounter (Signed)
I called patient to review results and recommendations per provider.  I left a message requesting a return call and sent a message via mychart as well.

## 2022-05-05 NOTE — Telephone Encounter (Signed)
-----   Message from Larey Dresser, MD sent at 05/04/2022  1:20 PM EDT ----- Make sure no K supplement, low K diet.

## 2022-05-06 ENCOUNTER — Telehealth (HOSPITAL_COMMUNITY): Payer: Self-pay | Admitting: Surgery

## 2022-05-06 ENCOUNTER — Other Ambulatory Visit: Payer: Self-pay

## 2022-05-06 ENCOUNTER — Other Ambulatory Visit (HOSPITAL_COMMUNITY): Payer: Self-pay

## 2022-05-06 ENCOUNTER — Other Ambulatory Visit (HOSPITAL_COMMUNITY): Payer: Self-pay | Admitting: Emergency Medicine

## 2022-05-06 DIAGNOSIS — I5042 Chronic combined systolic (congestive) and diastolic (congestive) heart failure: Secondary | ICD-10-CM

## 2022-05-06 MED ORDER — LOSARTAN POTASSIUM 25 MG PO TABS: 50.0000 mg | ORAL_TABLET | Freq: Every day | ORAL | 3 refills | Status: DC

## 2022-05-06 MED ORDER — FUROSEMIDE 20 MG PO TABS
40.0000 mg | ORAL_TABLET | Freq: Every day | ORAL | 3 refills | Status: DC
Start: 2022-05-06 — End: 2022-05-19

## 2022-05-06 MED ORDER — LOSARTAN POTASSIUM 25 MG PO TABS
50.0000 mg | ORAL_TABLET | Freq: Every day | ORAL | 3 refills | Status: DC
  Filled 2022-05-06: qty 90, 45d supply, fill #0

## 2022-05-06 MED ORDER — ATORVASTATIN CALCIUM 20 MG PO TABS: 20.0000 mg | ORAL_TABLET | Freq: Every day | ORAL | 3 refills | Status: DC

## 2022-05-06 MED ORDER — CARVEDILOL 6.25 MG PO TABS: 6.2500 mg | ORAL_TABLET | Freq: Two times a day (BID) | ORAL | 3 refills | Status: DC

## 2022-05-06 NOTE — Telephone Encounter (Signed)
I received a call from HF Community Paramedic in patient's home performing a visit today.  She was verifying phone call and message to patient from yesterday regarding medication change. Patient's BP 150/100 today manually.  Per Allena Katz NP patient to stop Potassium and increase Losartan to 50 mg daily.  I have updated [facility] medication list and informed HF Community Paramedic so that she can do medication reconciliation in patient's home.

## 2022-05-06 NOTE — Progress Notes (Signed)
Paramedicine Encounter    Patient ID: Sarah Long, female    DOB: [birth year], 51 y.o.   MRN: [id number]  Met with Sarah Long today for first visit.  She lives in an extended stay hotel room with her 3 children.  The room is not clean and is in disarray.  She appears to be well versed in her medical condition and medications.  She is currently taking her pills from   She denies chest pain or SOB.  No edema noted to her lower extremities. Lung sounds clear and equal bilat.  She had received a notification from Sarah Long that her potassium is elevated and at this time she should discontinue her potassium for  the time being.   Also pt blood pressure 150/100 taken manually.  Contacted Sarah Long regarding notes about pressure and she confirmed with Sarah Long to increase Losartan to 28m daily.  Labs will be checked again 6/14. Pt is planning on switching her medications to Sarah Long  New prescription  called in for Lasix 248m 2 tablets daily.  They will be delivered to her tomorrow.  Pt did not have a scale.  I provided her on from the Long supply.  I discussed the importance of weighing herself daily.  Her children arrived home from school and I tasked them with helping their mom weigh every morning before they leave for school.  Also discussed the importance of nutrition.  There was lots of junk food in the room and Sarah Long to having quite a sweet tooth.  She's been eating at Sarah Long a bit and I encouraged her to make better choices.  She states she is depressed because she's always worrying about living in a hotel with her children.  I talked to her about reaching out to her PCP for assistance with her depression and anxiety. Medications were reviewed.  She stated she could not afford a pill box for her meds and I told her I would try and get her one for her next visit which we have scheduled 05/13/22 @ 1:00.  BP (!) 150/100 (BP Location: Right Arm, Patient Position: Sitting,  Cuff Size: Normal)   Pulse 87   Resp 18   Wt 203 lb 6.4 oz (92.3 kg)   BMI 34.91 kg/m  Weight yesterday-not taken-no scale Last visit weight-219lb  Patient Care Team: WrElsie StainMD as PCP - General (Pulmonary Disease) TuSueanne MargaritaMD as PCP - Cardiology (Cardiology) CaConstance HawMD as PCP - Electrophysiology (Cardiology) HaOsker MasonRPH-CPP (Pharmacist) ShEthelda Chicks Social Worker  Patient Active Problem List   Diagnosis Date Noted   Peripheral vascular disease, unspecified ([facility]04/17/2023   Orbit fracture, left, closed, initial encounter (HCD[facility]04/17/2023   Dyslipidemia 02/03/2022   Type 2 diabetes mellitus with diabetic polyneuropathy, with long-term current use of insulin (HCM[facility]03/12/2021   Type 2 diabetes mellitus with hyperglycemia, with long-term current use of insulin (HCS[facility]03/12/2021   Microalbuminuria 02/03/2022   Homelessness 11/19/2021   Right great toe amputee (HCIndianola11/29/2022   Class 2 severe obesity due to excess calories with serious comorbidity and body mass index (BMI) of 36.0 to 36.9 in adult ([facility]21   Insomnia 10/07/2021   Localized edema 10/07/2021   Left below-knee amputee (HCM[place]07/27/2022   Normocytic anemia 06/26/2021   Heart failure with mid-range ejection fraction (HCZephyrh[facility]07/22/2022   Diabetic nephropathy with proteinuria (HCJ[facility]07/22/2022   Dental caries 04/01/2020   Cracked tooth  04/01/2020   Alcohol use 03/11/2020   Vitamin D deficiency 03/11/2020   Prolonged QT interval 02/26/2020   Chronic combined systolic and diastolic CHF (congestive heart failure) ([place]) 02/26/2020   Heart block AV complete ([place]) 02/11/2020   Exocrine pancreatic insufficiency    Dysmenorrhea 10/02/2019   DM type 2 with diabetic peripheral neuropathy ([place]) 05/30/2019   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 block) 05/29/2019   Type 2 diabetes mellitus ([place]) 10/21/2016   Former smoker 11/26/2013    Hypercholesteremia 02/02/2007   Bipolar 1 disorder ([place]) 02/02/2007   HYPERTENSION, BENIGN SYSTEMIC 02/02/2007    Current Outpatient Medications:    acetaminophen (TYLENOL) 325 MG tablet, Take 650 mg by mouth every 6 (six) hours as needed (pain)., Disp: , Rfl:    atorvastatin (LIPITOR) 20 MG tablet, Take 1 tablet (20 mg total) by mouth daily., Disp: 90 tablet, Rfl: 3   Blood Pressure Monitoring (BLOOD PRESSURE MONITOR 7) DEVI, Measure blood pressure daily, Disp: 1 each, Rfl: 0   carvedilol (COREG) 6.25 MG tablet, Take 1 tablet (6.25 mg total) by mouth 2 (two) times daily with a meal. (Patient taking differently: Take 6.25 mg by mouth 2 (two) times daily with a meal. Pt forgets and only taking once daily), Disp: 60 tablet, Rfl: 3   Continuous Blood Gluc Sensor (DEXCOM G6 SENSOR) MISC, 1 Device by Does not apply route as directed., Disp: 9 each, Rfl: 3   Continuous Blood Gluc Transmit (DEXCOM G6 TRANSMITTER) MISC, 1 Device by Does not apply route as directed., Disp: 1 each, Rfl: 3   furosemide (LASIX) 20 MG tablet, Take 2 tablets (40 mg total) by mouth daily., Disp: 180 tablet, Rfl: 3   insulin glargine (LANTUS SOLOSTAR) 100 UNIT/ML Solostar Pen, Inject 60 Units into the skin daily., Disp: 30 mL, Rfl: 6   insulin lispro (HUMALOG) 100 UNIT/ML KwikPen, use as directed (max daily dose: 60 units), Disp: 45 mL, Rfl: 6   Insulin Pen Needle 31G X 5 MM MISC, Use as directed in the morning, at noon, in the evening, and at bedtime., Disp: 400 each, Rfl: 3   naproxen sodium (ALEVE) 220 MG tablet, Take 220 mg by mouth daily as needed (pain)., Disp: , Rfl:    losartan (COZAAR) 25 MG tablet, Take 2 tablets (50 mg total) by mouth daily., Disp: 90 tablet, Rfl: 3   Multiple Vitamins-Minerals (ADULT ONE DAILY GUMMIES PO), Take 1 capsule by mouth daily. (Patient not taking: Reported on 05/06/2022), Disp: , Rfl:  Allergies  Allergen Reactions   Strawberry Extract Hives   Sulfa Antibiotics Hives and Itching       Social History   Socioeconomic History   Marital status: Significant Other    Spouse name: Not on file   Number of children: Not on file   Years of education: Not on file   Highest education level: Not on file  Occupational History   Not on file  Tobacco Use   Smoking status: Former    Packs/day: 0.50    Years: 20.00    Pack years: 10.00    Types: Cigarettes    Quit date: 07/01/2021    Years since quitting: 0.8   Smokeless tobacco: Never  Vaping Use   Vaping Use: Never used  Substance and Sexual Activity   Alcohol use: No   Drug use: No   Sexual activity: Yes    Birth control/protection: None  Other Topics Concern   Not on file  Social History Narrative  Not on file   Social Determinants of Health   Financial Resource Strain: High Risk   Difficulty of Paying Living Expenses: Hard  Food Insecurity: No Food Insecurity   Worried About Running Out of Food in the Last Year: Never true   Ran Out of Food in the Last Year: Never true  Transportation Needs: Unmet Transportation Needs   Lack of Transportation (Medical): Yes   Lack of Transportation (Non-Medical): Yes  Physical Activity: Not on file  Stress: Not on file  Social Connections: Not on file  Intimate Partner Violence: Not on file    Physical Exam      Future Appointments  Date Time Provider [place]  05/13/2022  9:30 AM THN CCC-MM PHARMACIST THN-CCC None  05/19/2022  9:30 AM MC-HVSC PA/NP MC-HVSC None  06/03/2022  9:40 AM Raulkar, Clide Deutscher, MD CPR-PRMA CPR  06/14/2022 10:10 AM Shamleffer, Melanie Crazier, MD LBPC-LBENDO None  07/27/2022  6:10 PM CVD[facility] DEVICE REMOTES [facility]  07/29/2022 10:00 AM Larey Dresser, MD MC-HVSC None  09/07/2022 10:20 AM Ranell Patrick Clide Deutscher, MD CPR-PRMA CPR  10/26/2022  6:10 PM CVD-[facility] DEVICE REMOTES [facility]  12/09/2022  9:45 AM Raulkar, Clide Deutscher, MD CPR-PRMA CPR  01/25/2023  6:10 PM CVD-CHURCH DEVICE REMOTES [facility]       [name], [facility] Paramedic  05/06/22

## 2022-05-13 ENCOUNTER — Telehealth (HOSPITAL_COMMUNITY): Payer: Self-pay | Admitting: Licensed Clinical Social Worker

## 2022-05-13 ENCOUNTER — Other Ambulatory Visit (HOSPITAL_COMMUNITY): Payer: Self-pay | Admitting: Emergency Medicine

## 2022-05-13 ENCOUNTER — Other Ambulatory Visit: Payer: Self-pay | Admitting: Pharmacist

## 2022-05-13 DIAGNOSIS — I5022 Chronic systolic (congestive) heart failure: Secondary | ICD-10-CM

## 2022-05-13 DIAGNOSIS — I739 Peripheral vascular disease, unspecified: Secondary | ICD-10-CM

## 2022-05-13 DIAGNOSIS — E785 Hyperlipidemia, unspecified: Secondary | ICD-10-CM

## 2022-05-13 DIAGNOSIS — I10 Essential (primary) hypertension: Secondary | ICD-10-CM

## 2022-05-13 NOTE — Telephone Encounter (Signed)
H&V Care Navigation CSW Progress Note  Clinical Social Worker contacted patient by phone to discuss housing concerns.  Patient is participating in a Managed Medicaid Plan:  Yes  SDOH Screenings   Alcohol Screen: Not on file  Depression (PHQ2-9): Medium Risk (03/22/2022)   Depression (PHQ2-9)    PHQ-2 Score: 12  Financial Resource Strain: High Risk (05/13/2022)   Overall Financial Resource Strain (CARDIA)    Difficulty of Paying Living Expenses: Hard  Food Insecurity: Food Insecurity Present (05/13/2022)   Hunger Vital Sign    Worried About Running Out of Food in the Last Year: Often true    Ran Out of Food in the Last Year: Often true  Housing: Medium Risk (04/21/2022)   Housing    Last Housing Risk Score: 1  Physical Activity: Not on file  Social Connections: Not on file  Stress: Not on file  Tobacco Use: Medium Risk (04/26/2022)   Patient History    Smoking Tobacco Use: Former    Smokeless Tobacco Use: Never    Passive Exposure: Not on file  Transportation Needs: Unmet Transportation Needs (04/01/2022)   PRAPARE - Hydrologist (Medical): Yes    Lack of Transportation (Non-Medical): Yes   Pt reports she is almost out of money to pay for hotel- costs $900/month- has already received all the help she can from her church and other resources.  Pt unemployed- only reported income is her son's income from his deceased father.  Has pending disability which was started in Feb so likely several months away from determination.  Pt has been speaking with [facility] about placement there but no success at this time.  CSW helping to look into alternative shelter resources for pt.  Will continue to follow and assist as needed Jorge Ny, [facility] Desk#: [phone] Cell#: [phone]

## 2022-05-13 NOTE — Patient Instructions (Signed)
Abbigail,  It was great talking to you today!  Visit this website for information about the Care Card benefits.   [email or web address]  I'll talk to the social work team about any housing resources and/or healthy food resources. I'll talk to Dr. Joya Gaskins about an appointment for your mental health.   Thanks!  Catie Hedwig Morton, PharmD, [facility] [phone]

## 2022-05-13 NOTE — Progress Notes (Signed)
Paramedicine Encounter    Patient ID: Sarah Long, female    DOB: Jul 31, 1971, 51 y.o.   MRN: [id number]   BP 130/80 (BP Location: Left Arm, Patient Position: Sitting, Cuff Size: Normal)   Pulse 68   Resp 18   Wt 204 lb 9.6 oz (92.8 kg)   BMI 35.12 kg/m  Weight yesterday-198lb Last visit weight-203lb [id number]  Sarah Long appears to be doing well today.  She has no complaints of chest pain or SOB.  Lung sounds clear and equal bilat and no peripheral edema noted.  Pt has done well with her heart medicines except missing a couple doses of Furosemide due to miss communication with the pharmacy and they had delivered med to the front office of the hotel.  She was rather emotional stating she's worried about her living arrangements and being able to make the rent.  She advised me that she had spoken to [place] today about this issue and Eliezer Lofts is working on other options for her.  Pt admits to not being totally compliant with her short acting insulin.  Her sugar was elevated today but she says she was just giving herself an injection when I arrived.  We discussed ways to improve on this and her children who were present at time of my visit agreed to help her remember to take her insulin like she should.  I will repeat visit next Thursday @ 1:00.    Patient Care Team: [name], MD as PCP - General (Pulmonary Disease) Sueanne Margarita, MD as PCP - Cardiology (Cardiology) Constance Haw, MD as PCP - Electrophysiology (Cardiology) Osker Mason, RPH-CPP (Pharmacist) Ethelda Chick as Social Worker  Patient Active Problem List   Diagnosis Date Noted   Peripheral vascular disease, unspecified ([place]) 03/22/2022   Orbit fracture, left, closed, initial encounter ([place]) 03/22/2022   Dyslipidemia 02/03/2022   Type 2 diabetes mellitus with diabetic polyneuropathy, with long-term current use of insulin ([place]) 02/03/2022   Type 2 diabetes mellitus with hyperglycemia, with long-term current  use of insulin ([place]) 02/03/2022   Microalbuminuria 02/03/2022   Homelessness 11/19/2021   Right great toe amputee ([place]) 11/03/2021   Class 2 severe obesity due to excess calories with serious comorbidity and body mass index (BMI) of 36.0 to 36.9 in adult [facility]) 10/15/2021   Insomnia 10/07/2021   Localized edema 10/07/2021   Left below-knee amputee ([place]) 07/01/2021   Normocytic anemia 06/26/2021   Heart failure with mid-range ejection fraction ([place]) 06/26/2021   Diabetic nephropathy with proteinuria ([place]) 06/26/2021   Dental caries 04/01/2020   Cracked tooth 04/01/2020   Alcohol use 03/11/2020   Vitamin D deficiency 03/11/2020   Prolonged QT interval 02/26/2020   Chronic combined systolic and diastolic CHF (congestive heart failure) ([place]) 02/26/2020   Heart block AV complete ([place]) 02/11/2020   Exocrine pancreatic insufficiency    Dysmenorrhea 10/02/2019   DM type 2 with diabetic peripheral neuropathy ([place]) 05/30/2019   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 block) 05/29/2019   Type 2 diabetes mellitus ([place]) 10/21/2016   Former smoker 11/26/2013   Hypercholesteremia 02/02/2007   Bipolar 1 disorder ([place]) 02/02/2007   HYPERTENSION, BENIGN SYSTEMIC 02/02/2007    Current Outpatient Medications:    acetaminophen (TYLENOL) 325 MG tablet, Take 650 mg by mouth every 6 (six) hours as needed (pain)., Disp: , Rfl:    atorvastatin (LIPITOR) 20 MG tablet, Take 1 tablet (20 mg total) by mouth daily., Disp: 90 tablet, Rfl: 3  Blood Pressure Monitoring (BLOOD PRESSURE MONITOR 7) DEVI, Measure blood pressure daily, Disp: 1 each, Rfl: 0   carvedilol (COREG) 6.25 MG tablet, Take 1 tablet (6.25 mg total) by mouth 2 (two) times daily with a meal., Disp: 180 tablet, Rfl: 3   Continuous Blood Gluc Sensor (DEXCOM G6 SENSOR) MISC, 1 Device by Does not apply route as directed., Disp: 9 each, Rfl: 3   Continuous Blood Gluc Transmit (DEXCOM G6 TRANSMITTER) MISC, 1 Device by Does not apply  route as directed., Disp: 1 each, Rfl: 3   furosemide (LASIX) 20 MG tablet, Take 2 tablets (40 mg total) by mouth daily., Disp: 180 tablet, Rfl: 3   insulin glargine (LANTUS SOLOSTAR) 100 UNIT/ML Solostar Pen, Inject 60 Units into the skin daily., Disp: 30 mL, Rfl: 6   insulin lispro (HUMALOG) 100 UNIT/ML KwikPen, use as directed (max daily dose: 60 units), Disp: 45 mL, Rfl: 6   Insulin Pen Needle 31G X 5 MM MISC, Use as directed in the morning, at noon, in the evening, and at bedtime., Disp: 400 each, Rfl: 3   losartan (COZAAR) 25 MG tablet, Take 2 tablets (50 mg total) by mouth daily., Disp: 90 tablet, Rfl: 3   Multiple Vitamins-Minerals (ADULT ONE DAILY GUMMIES PO), Take 1 capsule by mouth daily. (Patient not taking: Reported on 05/06/2022), Disp: , Rfl:    naproxen sodium (ALEVE) 220 MG tablet, Take 220 mg by mouth daily as needed (pain). (Patient not taking: Reported on 05/13/2022), Disp: , Rfl:  Allergies  Allergen Reactions   Strawberry Extract Hives   Sulfa Antibiotics Hives and Itching      Social History   Socioeconomic History   Marital status: Significant Other    Spouse name: Not on file   Number of children: Not on file   Years of education: Not on file   Highest education level: Not on file  Occupational History   Not on file  Tobacco Use   Smoking status: Former    Packs/day: 0.50    Years: 20.00    Total pack years: 10.00    Types: Cigarettes    Quit date: 07/01/2021    Years since quitting: 0.8   Smokeless tobacco: Never  Vaping Use   Vaping Use: Never used  Substance and Sexual Activity   Alcohol use: No   Drug use: No   Sexual activity: Yes    Birth control/protection: None  Other Topics Concern   Not on file  Social History Narrative   Not on file   Social Determinants of Health   Financial Resource Strain: High Risk (05/13/2022)   Overall Financial Resource Strain (CARDIA)    Difficulty of Paying Living Expenses: Hard  Food Insecurity: Food  Insecurity Present (05/13/2022)   Hunger Vital Sign    Worried About Running Out of Food in the Last Year: Often true    Ran Out of Food in the Last Year: Often true  Transportation Needs: Unmet Transportation Needs (04/01/2022)   PRAPARE - Hydrologist (Medical): Yes    Lack of Transportation (Non-Medical): Yes  Physical Activity: Not on file  Stress: Not on file  Social Connections: Not on file  Intimate Partner Violence: Not on file    Physical Exam      Future Appointments  Date Time Provider [place]  05/19/2022  9:30 AM MC-HVSC PA/NP MC-HVSC None  05/27/2022 10:00 AM THN CCC-MM PHARMACIST THN-CCC None  06/03/2022  9:40 AM Raulkar, Clide Deutscher,  MD CPR-PRMA CPR  06/14/2022 10:10 AM Shamleffer, Melanie Crazier, MD LBPC-LBENDO None  07/27/2022  6:10 PM CVD[facility] DEVICE REMOTES [facility]  07/29/2022 10:00 AM Larey Dresser, MD MC-HVSC None  09/07/2022 10:20 AM Ranell Patrick, Clide Deutscher, MD CPR-PRMA CPR  10/26/2022  6:10 PM CVD-[facility] DEVICE REMOTES [facility]  12/09/2022  9:45 AM Raulkar, Clide Deutscher, MD CPR-PRMA CPR  01/25/2023  6:10 PM CVD-[facility] DEVICE REMOTES [facility]       [place], [place] Paramedic  05/13/22

## 2022-05-13 NOTE — Chronic Care Management (AMB) (Signed)
Chief Complaint  Patient presents with   High Risk Managed Medicaid    Sarah Long is a 51 y.o. year old female who presented for a telephone visit.   They were referred to the pharmacist by their High Risk Managed Medicaid Care Team  for assistance in managing diabetes and hypertension.   Patient is participating in a Managed Medicaid Plan:  Yes  Subjective:  Care Team: Primary Care Provider: [name], MD ; Next Scheduled Visit: not scheduled Cardiologist: HF Aundra Dubin; Next Scheduled Visit: EMT visit today, HF clinic visit 6/14 Endocrinology: [facility]; Next Visit: 06/14/22 PM&R: [facility]; Next Scheduled Visit: 06/03/22  Medication Access/Adherence  Current Pharmacy:  [place], [place] - [address] [place] [address] Phone: [phone] Fax: [phone]   Patient reports affordability concerns with their medications: No   Patient reports access/transportation concerns to their pharmacy: No  - transferred to [place], delivery Patient reports adherence concerns with their medications:  No  reports she has set an alarm on her phone to help remind her to take her medications   Diabetes:   Current medications: Current medications: Lantus 56 units daily, Humalog per sliding scale  Medications tried in the past: metformin IR - reports diarrhea; hx pancreatitis (3/21 - documented per GI at the time that etiology was unclear, TG were normal) so avoid GLP1     Current glucose readings: using DexCom CGM. Reports readings continue to be in 300s, but having a difficult time affording healthy foods    Plan to discuss metformin XR with Dr. Kelton Pillar at next appt in July   Hyperlipidemia/ASCVD Risk Reduction  Current lipid lowering medications: atorvastatin 20 mg daily  Heart Failure:  Current medications:  ACEi/ARB/ARNI: losartan 50 mg daily - confirms she increased dose SGLT2i: none, though  noted hx DKA Beta blocker: carvedilol 6.25 mg twice daily Mineralocorticoid Receptor Antagonist: none, consider moving forward Diuretic regimen: furosemide 40 mg daily - reports she has been out for ~ 2-3 days  Current home blood pressure readings: reports continued elevations, EMT visit later today Current home weights: reports concern that her scale is not accurate  Patient reports volume overload signs or symptoms including shortness of breath, though also reports anxiety about wildfire smoke in [place] and being outside, reports family history of lung disease  Health Maintenance  There are no preventive care reminders to display for this patient.   Objective: Lab Results  Component Value Date   HGBA1C 8.7 (H) 04/21/2022    Lab Results  Component Value Date   CREATININE 1.14 (H) 05/04/2022   BUN 22 (H) 05/04/2022   NA 136 05/04/2022   K 5.2 (H) 05/04/2022   CL 109 05/04/2022   CO2 21 (L) 05/04/2022    Lab Results  Component Value Date   CHOL 123 04/21/2022   HDL 42 04/21/2022   LDLCALC 53 04/21/2022   TRIG 141 04/21/2022   CHOLHDL 2.9 04/21/2022    Medications Reviewed Today     Reviewed by Osker Mason, RPH-CPP (Pharmacist) on 05/13/22 at [phone]  Med List Status: <None>   Medication Order Taking? Sig Documenting Provider Last Dose Status Informant  acetaminophen (TYLENOL) 325 MG tablet [id number]  Take 650 mg by mouth every 6 (six) hours as needed (pain). [provider]  Active   atorvastatin (LIPITOR) 20 MG tablet [id number] Yes Take 1 tablet (20 mg total) by mouth daily. Loralie Champagne  S, MD Taking Active   Blood Pressure Monitoring (BLOOD PRESSURE MONITOR 7) DEVI [id number] Yes Measure blood pressure daily [name], MD Taking Active   carvedilol (COREG) 6.25 MG tablet [id number] Yes Take 1 tablet (6.25 mg total) by mouth 2 (two) times daily with a meal. Larey Dresser, MD Taking Active   Continuous Blood Gluc Sensor (DEXCOM G6 SENSOR) MISC  [id number] Yes 1 Device by Does not apply route as directed. Shamleffer, Melanie Crazier, MD Taking Active   Continuous Blood Gluc Transmit (DEXCOM G6 TRANSMITTER) MISC [id number] Yes 1 Device by Does not apply route as directed. Shamleffer, Melanie Crazier, MD Taking Active   furosemide (LASIX) 20 MG tablet [id number] Yes Take 2 tablets (40 mg total) by mouth daily. Larey Dresser, MD Taking Active   insulin glargine (LANTUS SOLOSTAR) 100 UNIT/ML Solostar Pen [id number]  Inject 60 Units into the skin daily. Shamleffer, Melanie Crazier, MD  Active   insulin lispro (HUMALOG) 100 UNIT/ML KwikPen [id number]  use as directed (max daily dose: 60 units) Shamleffer, Melanie Crazier, MD  Active   Insulin Pen Needle 31G X 5 MM MISC [id number] Yes Use as directed in the morning, at noon, in the evening, and at bedtime. Shamleffer, Melanie Crazier, MD Taking Active   losartan (COZAAR) 25 MG tablet [id number] Yes Take 2 tablets (50 mg total) by mouth daily. Larey Dresser, MD Taking Active   Multiple Vitamins-Minerals (ADULT ONE DAILY GUMMIES PO) [id number]  Take 1 capsule by mouth daily.  Patient not taking: Reported on 05/06/2022   [provider]  Active   naproxen sodium (ALEVE) 220 MG tablet [id number]  Take 220 mg by mouth daily as needed (pain). [provider]  Active               Assessment/Plan:  Care Plan : Medication Management  Updates made by Osker Mason, RPH-CPP since 05/13/2022 12:00 AM     Problem: Hypertension/CHF      Long-Range Goal: Reduce Risk of Readmission   Note:   Current Barriers:  Does not adhere to prescribed medication regimen  Patient Needs: Improved collaboration with dispensing pharmacy  Patient Activities: Patient will:  - take medications as prescribed as evidenced by patient report and record review check blood pressure daily, document, and provide at future appointments weigh daily, and contact provider if weight gain of > 3 lbs in a  day or >5 lbs in a week     Problem: Diabetes      Goal: A1c <7%   Note:   Current Barriers:  Unable to achieve control of diabetes   Patient Needs: Medication optimization  Patient Activities: Patient will:  - take medications as prescribed as evidenced by patient report and record review check glucose continuously using CGM, document, and provide at future appointments        Diabetes: - Currently uncontrolled - Will collaborate w/ SW team regarding any help for heathy food.  - Reviewed recommendation to discuss metformin XR with Dr. Kelton Pillar in July  Hyperlipidemia/ASCVD Risk Reduction: - Currently controlled.  - Praised for control of lipids  Heart Failure: - Currently inappropriately managed/opportunity for optimization Naval architect. Furosemide was delivered last week. Patient notes she did not receive. Pharmacist suggested she check the front desk, patient will do that and discuss with EMT later today. - Encouraged continued collaboration with HF clinic for medication adjustment and titration.   Reports concern with control of her anxiety. Discussed her interested in getting  back into therapy, possibly scheduling with PCP. PCP's next available is in August. Patient wondering if any sooner appointments. Will collaborate with PCP to determine what he would prefer.    Follow Up Plan: Phone call in 2 weeks  Catie Hedwig Morton, PharmD, [facility] [phone]

## 2022-05-17 ENCOUNTER — Telehealth (HOSPITAL_COMMUNITY): Payer: Self-pay | Admitting: Licensed Clinical Social Worker

## 2022-05-17 ENCOUNTER — Telehealth: Payer: Self-pay

## 2022-05-17 NOTE — Telephone Encounter (Signed)
H&V Care Navigation CSW Progress Note  Clinical Social Worker contacted patient by phone to discuss housing concerns.  Patient is participating in a Managed Medicaid Plan:  Yes  SDOH Screenings   Alcohol Screen: Not on file  Depression (PHQ2-9): Medium Risk (03/22/2022)   Depression (PHQ2-9)    PHQ-2 Score: 12  Financial Resource Strain: High Risk (05/13/2022)   Overall Financial Resource Strain (CARDIA)    Difficulty of Paying Living Expenses: Hard  Food Insecurity: Food Insecurity Present (05/13/2022)   Hunger Vital Sign    Worried About Running Out of Food in the Last Year: Often true    Ran Out of Food in the Last Year: Often true  Housing: Medium Risk (05/17/2022)   Housing    Last Housing Risk Score: 1  Physical Activity: Not on file  Social Connections: Not on file  Stress: Not on file  Tobacco Use: Medium Risk (04/26/2022)   Patient History    Smoking Tobacco Use: Former    Smokeless Tobacco Use: Never    Passive Exposure: Not on file  Transportation Needs: Unmet Transportation Needs (04/01/2022)   PRAPARE - Hydrologist (Medical): Yes    Lack of Transportation (Non-Medical): Yes   CSW had reached out to [facility] to see if pt was currently on coordinated re-entry list- they report last interaction they had with pt was 2014 so not currently on the list for possible case management services- CSW provided pt current contact information to them and they will reach out to assess and discuss possible services.  CSW also contacted Gilberto Better with [employer] program to see if pt might be eligible for assistance (had been informed that they can help with hotel stays up to $1000- for pt this would pay for another month)- they will reach out to pt to assess if they can assist.  Will continue to follow and assist as needed  Jorge Ny, [facility] Desk#: [phone] Cell#:  [phone]

## 2022-05-17 NOTE — Telephone Encounter (Signed)
Called and left vm about appt

## 2022-05-18 ENCOUNTER — Telehealth (HOSPITAL_COMMUNITY): Payer: Self-pay

## 2022-05-18 NOTE — Telephone Encounter (Signed)
Called and left patient a voice message  to confirm/remind patient of their appointment at the [facility] on 05/19/22.   A message was also left for Patient to bring all medications and/or complete list and arrive 10-15 minutes early to get checked in and to give our office a call back if needed.

## 2022-05-19 ENCOUNTER — Encounter (HOSPITAL_COMMUNITY): Payer: Self-pay

## 2022-05-19 ENCOUNTER — Telehealth: Payer: Self-pay | Admitting: Orthopedic Surgery

## 2022-05-19 ENCOUNTER — Ambulatory Visit (HOSPITAL_COMMUNITY)
Admission: RE | Admit: 2022-05-19 | Discharge: 2022-05-19 | Disposition: A | Discharge status: Home / Self Care | Payer: Medicaid Other | Source: Ambulatory Visit | Attending: Family Medicine | Admitting: Family Medicine

## 2022-05-19 VITALS — BP 146/92 | HR 95 | Ht 64.0 in | Wt 215.8 lb

## 2022-05-19 DIAGNOSIS — I10 Essential (primary) hypertension: Secondary | ICD-10-CM

## 2022-05-19 DIAGNOSIS — E119 Type 2 diabetes mellitus without complications: Secondary | ICD-10-CM | POA: Diagnosis not present

## 2022-05-19 DIAGNOSIS — F17210 Nicotine dependence, cigarettes, uncomplicated: Secondary | ICD-10-CM | POA: Diagnosis not present

## 2022-05-19 DIAGNOSIS — Z8719 Personal history of other diseases of the digestive system: Secondary | ICD-10-CM | POA: Insufficient documentation

## 2022-05-19 DIAGNOSIS — Z6837 Body mass index (BMI) 37.0-37.9, adult: Secondary | ICD-10-CM | POA: Diagnosis not present

## 2022-05-19 DIAGNOSIS — Z8249 Family history of ischemic heart disease and other diseases of the circulatory system: Secondary | ICD-10-CM | POA: Insufficient documentation

## 2022-05-19 DIAGNOSIS — I5022 Chronic systolic (congestive) heart failure: Secondary | ICD-10-CM | POA: Diagnosis not present

## 2022-05-19 DIAGNOSIS — I428 Other cardiomyopathies: Secondary | ICD-10-CM | POA: Diagnosis not present

## 2022-05-19 DIAGNOSIS — Z79899 Other long term (current) drug therapy: Secondary | ICD-10-CM | POA: Diagnosis not present

## 2022-05-19 DIAGNOSIS — Z794 Long term (current) use of insulin: Secondary | ICD-10-CM | POA: Diagnosis not present

## 2022-05-19 DIAGNOSIS — E785 Hyperlipidemia, unspecified: Secondary | ICD-10-CM | POA: Diagnosis not present

## 2022-05-19 DIAGNOSIS — E1169 Type 2 diabetes mellitus with other specified complication: Secondary | ICD-10-CM | POA: Diagnosis not present

## 2022-05-19 DIAGNOSIS — E669 Obesity, unspecified: Secondary | ICD-10-CM | POA: Diagnosis not present

## 2022-05-19 DIAGNOSIS — I110 Hypertensive heart disease with heart failure: Secondary | ICD-10-CM | POA: Diagnosis not present

## 2022-05-19 DIAGNOSIS — Z720 Tobacco use: Secondary | ICD-10-CM | POA: Diagnosis not present

## 2022-05-19 DIAGNOSIS — I442 Atrioventricular block, complete: Secondary | ICD-10-CM | POA: Diagnosis not present

## 2022-05-19 DIAGNOSIS — Z139 Encounter for screening, unspecified: Secondary | ICD-10-CM

## 2022-05-19 LAB — BASIC METABOLIC PANEL
Anion gap: 7 (ref 5–15)
BUN: 25 mg/dL — ABNORMAL HIGH (ref 6–20)
CO2: 20 mmol/L — ABNORMAL LOW (ref 22–32)
Calcium: 8.4 mg/dL — ABNORMAL LOW (ref 8.9–10.3)
Chloride: 108 mmol/L (ref 98–111)
Creatinine, Ser: 1.21 mg/dL — ABNORMAL HIGH (ref 0.44–1.00)
GFR, Estimated: 55 mL/min — ABNORMAL LOW (ref >60)
Glucose, Bld: 263 mg/dL — ABNORMAL HIGH (ref 70–99)
Potassium: 4.2 mmol/L (ref 3.5–5.1)
Sodium: 135 mmol/L (ref 135–145)

## 2022-05-19 MED ORDER — SPIRONOLACTONE 25 MG PO TABS: 12.5000 mg | ORAL_TABLET | Freq: Every day | ORAL | 3 refills | Status: DC

## 2022-05-19 MED ORDER — FUROSEMIDE 20 MG PO TABS: 20.0000 mg | ORAL_TABLET | Freq: Every day | ORAL | 3 refills | Status: DC

## 2022-05-19 NOTE — Telephone Encounter (Signed)
Pt called and needs new script for both prothesis sock liners with the metal part attached.   CB [phone]

## 2022-05-19 NOTE — Progress Notes (Addendum)
PCP: [name], MD Endocrinology: Dr. Kelton Pillar EP: Dr. Curt Bears Cardiology: Dr. Aundra Dubin  51 y.o. with history of type 2 diabetes, HTN, hyperlipidemia, complete heart block with PPM, and chronic diastolic CHF was referred by Dr. Joya Gaskins for evaluation of CHF.  Patient has had diabetes since age 58 and has had HTN for at least 5 years.  She is a smoker and has a history of prior ETOH abuse with ETOH pancreatitis. In 3/21, she was admitted with DKA.  She was found to have associated complete heart block that did not resolve, she had St Jude PPM placed.  She had an echocardiogram showing EF 40-45% with severe LV hypertrophy.  She did not have a cardiac MRI prior to PPM placement.  Cath in 3/21 showed no significant CAD (nonischemic cardiomyopathy).   Last seen in 5/21, doing well but not taking any cardiac meds. Entresto started and started work up for cardiac sarcoidosis and cardiac amyloidosis.  Echo (12/22): EF 70-75%, severe LVH, normal RV, mild to moderate MR.  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 She has a functioning orthotic and is followed by PMR.  She had follow up with EP 1/23, PPM functioning appropriately. Daily lasix restarted and cMRI arranged.  cMRI (2/23) with LVEF 68%, ECV 54%. Consider amyloid or hypertrophic DCM. No LVOT.   Follow up 5/23, NYHA II symptoms, volume overloaded. Lasix increase to 40 daily and genetic testing arranged to evaluate for TTR amyloidosis and hypertrophic cardiomyopathies. Repeat PYP scan and hi-res Chest CT also arranged.  Today she returns for HF follow up. Overall feeling fine. She has mild orthopnea but attributes this to anxiety. She walks outside on flat ground without dyspnea. Denies CP, dizziness, edema, or PND. Appetite ok. No fever or chills. Weight at home 204 pounds. Taking all medications. Smokes 1 pack cigarettes/week. She lives in a hotel w/ her 3 children for the past 3 years. She has a car, she is un-employed. Followed by paramedicine, [facility], and doing  better with medication compliance.  ECG (personally reviewed): none ordered today.  St Jude device interrogation: unable to interrogate device in clinic today.  Labs (4/21): K 4, creatinine 0.87 Labs (2/23): K 4.4, creatinine 1.12 Labs (5/23): K 5.2, creatinine 1.14, LDL 53, HDL 42  PMH: 1. Type 2 diabetes: Diagnosed at age 47.  2. HTN 3. Hyperlipidemia 4. Active smoker 5. H/o ETOH pancreatitis 6. Bipolar disorder 7. Complete heart block: 3/21, associate with DKA episode.  Has St Jude PPM.  8. Chronic systolic CHF: Nonischemic cardiomyopathy.  Echo (3/21) with EF 40-45%, severe LVH, mild MR, normal RV.  - LHC (3/21) normal coronaries.  - cMRI (2/23): LVEF 68%, ECV 54%. Consider amyloid or hypertrophic DCM. No LVOT.  9. S/p left BKA (7/22). Followed by PMR, has Phantom Limb pain.  SH: Divorced, un-employed, prior heavy ETOH now rare, smokes 5 cigarettes daily.  FH: Grandfather with CHF. No known sarcoidosis or amyloidosis.   ROS: All systems reviewed and negative except as per HPI.   Current Outpatient Medications  Medication Sig Dispense Refill   acetaminophen (TYLENOL) 325 MG tablet Take 650 mg by mouth every 6 (six) hours as needed (pain).     atorvastatin (LIPITOR) 20 MG tablet Take 1 tablet (20 mg total) by mouth daily. 90 tablet 3   Blood Pressure Monitoring (BLOOD PRESSURE MONITOR 7) DEVI Measure blood pressure daily 1 each 0   carvedilol (COREG) 6.25 MG tablet Take 1 tablet (6.25 mg total) by mouth 2 (two) times daily with a  meal. 180 tablet 3   Continuous Blood Gluc Sensor (DEXCOM G6 SENSOR) MISC 1 Device by Does not apply route as directed. 9 each 3   Continuous Blood Gluc Transmit (DEXCOM G6 TRANSMITTER) MISC 1 Device by Does not apply route as directed. 1 each 3   furosemide (LASIX) 20 MG tablet Take 2 tablets (40 mg total) by mouth daily. 180 tablet 3   insulin glargine (LANTUS SOLOSTAR) 100 UNIT/ML Solostar Pen Inject 60 Units into the skin daily. 30 mL 6   insulin  lispro (HUMALOG) 100 UNIT/ML KwikPen use as directed (max daily dose: 60 units) 45 mL 6   Insulin Pen Needle 31G X 5 MM MISC Use as directed in the morning, at noon, in the evening, and at bedtime. 400 each 3   losartan (COZAAR) 25 MG tablet Take 2 tablets (50 mg total) by mouth daily. 90 tablet 3   Multiple Vitamins-Minerals (ADULT ONE DAILY GUMMIES PO) Take 1 capsule by mouth daily.     naproxen sodium (ALEVE) 220 MG tablet Take 220 mg by mouth daily as needed (pain).     No current facility-administered medications for this encounter.   Wt Readings from Last 3 Encounters:  05/19/22 97.9 kg (215 lb 12.8 oz)  05/13/22 92.8 kg (204 lb 9.6 oz)  05/06/22 92.3 kg (203 lb 6.4 oz)   BP (!) 146/92   Pulse 95   Ht '5\' 4"'$  (1.626 m)   Wt 97.9 kg (215 lb 12.8 oz)   SpO2 100%   BMI 37.04 kg/m  Physical Exam: General:  NAD. No resp difficulty HEENT: Normal Neck: Supple. No JVD. Carotids 2+ bilat; no bruits. No lymphadenopathy or thryomegaly appreciated. Cor: PMI nondisplaced. Regular rate & rhythm. No rubs, gallops or murmurs. Lungs: Clear Abdomen: Obese, soft, nontender, nondistended. No hepatosplenomegaly. No bruits or masses. Good bowel sounds. Extremities: No cyanosis, clubbing, rash, edema Neuro: Alert & oriented x 3, cranial nerves grossly intact. Moves all 4 extremities w/o difficulty. Affect pleasant.  Assessment/Plan: 1. Chronic systolic CHF: Echo in 9/52 with EF 40-45%, severe LV hypertrophy.  This was found in association with complete heart block.  Nonischemic cardiomyopathy, cath in 3/21 showed no significant CAD.  Etiology still uncertain, may be due to long-standing HTN but association with CHB is concerning for cardiac sarcoidosis or cardiac amyloidosis also.  No known family history of either sarcoidosis or amyloidosis. Given her young age, if amyloidosis is present, it would likely be hereditary TTR. PYP (6/21) not suggestive of TTR amyloidosis. cMRI (2/23): LVEF 68%, ECV 54%.  Consider amyloid or hypertrophic DCM. No LVOT. On exam, she is not volume overloaded, weight up on clinic scale, but consistently 203-204 on home checks with paramedicine.  NYHA class II symptoms.  - Start spiro 12.5 mg daily. BMET today, repeat in 1 week. - Decrease Lasix to 20 mg daily. - Continue Coreg 6.25 mg bid. - Continue losartan 50 mg daily. - Would avoid SGLT2i as she has history of DKA - Myeloma panel, urine immunofixation, negative. PYP scan (6/21) not suggestive of TTR amyloid. Repeat myeloma panel 5/23 negative. [facility] genetic testing for TTR and cardiomyopathies (including hypertrophic cardiomyopathy) has been sent, awaiting results. We will need to coordinate urine immunofixation. - Repeat PYP scan has been arranged.  - Arrange for high resolution CT chest to assess for any sign of pulmonary sarcoidosis.  She will need ACE level done.   - If further concern for sarcoidosis remains, could consider cardiac PET.  2. HTN: Controlled. GDMT  as above.   3. Hyperlipidemia: Good lipids 5/23. 4. Active smoker: I strongly urged her to quit.    5. Type 2 diabetes: Followed by Endocrinology. She is on insulin. - Most recent A1C < 7.0 6. Complete heart block: She has a St Jude PPM and is pacing her RV > 99% of the time.  As above, workup for cardiac sarcoidosis and cardiac amyloidosis as underlying cause.  7. SDOH: Lives in hotel x 3 years with 3 out of 4 of her children. She is un-employed. Has transportation. - HFSW helping with resources, continue paramedicine.  8. Obesity: Body mass index is 37.04 kg/m. - Considered referral for GLP1RA, but she has history of pancreatitis.  Follow up in 4 weeks with Dr. Aundra Dubin, as scheduled.  [facility] FNP-BC 05/19/2022

## 2022-05-19 NOTE — Patient Instructions (Signed)
START Spironolactone 12.5 mg, one half tab daily DECREASE Lasix to 20 mg, one tab daily   Labs today We will only contact you if something comes back abnormal or we need to make some changes. Otherwise no news is good news!  Labs needed in one week  Keep cardiology follow up as scheduled  You have been referred to [place] Work Team -they will be in contact   Do the following things EVERYDAY: Weigh yourself in the morning before breakfast. Write it down and keep it in a log. Take your medicines as prescribed Eat low salt foods--Limit salt (sodium) to 2000 mg per day.  Stay as active as you can everyday Limit all fluids for the day to less than 2 liters  At the [facility], you and your health needs are our priority. As part of our continuing mission to provide you with exceptional heart care, we have created designated Provider Care Teams. These Care Teams include your primary Cardiologist (physician) and Advanced Practice Providers (APPs- Physician Assistants and Nurse Practitioners) who all work together to provide you with the care you need, when you need it.   You may see any of the following providers on your designated Care Team at your next follow up: Dr Glori Bickers Dr Haynes Kerns, NP Lyda Jester, [place] [facility] [place], [place] Audry Riles, PharmD   Please be sure to bring in all your medications bottles to every appointment.

## 2022-05-20 ENCOUNTER — Other Ambulatory Visit (HOSPITAL_COMMUNITY): Payer: Self-pay | Admitting: Emergency Medicine

## 2022-05-20 NOTE — Progress Notes (Unsigned)
Paramedicine Encounter    Patient ID: Sarah Long, female    DOB: [birth year], 51 y.o.   MRN: [id number]   There were no vitals taken for this visit. Weight yesterday-215lbs 12.8 oz. (At clinic visit) Last visit weight-204lb 9.6 oz [id number]  Met with Sarah Long today. She states [facility] and decreased Lasix and started Arlyce Harman.  She has done well being compliant taking her meds.  Pill box refilled.  She has not taken her morning meds yet because she's waiting to eat.  No chest pain or SOB.  Lung sounds clear and equal bilat.  No edema noted. Pt has poor nutritional habits.  She had a stash of candy bars that she said she was hiding  from her kids.   She is still having anxiety about making this months rent. She has been staying in his location for 7 months.  Her living conditions are very unclean and there's a pungent odor.  I will reach out to [name] to follow up.  Also, gave her a pill cutter for her Arlyce Harman.    Patient Care Team: [name], MD as PCP - General (Pulmonary Disease) Sueanne Margarita, MD as PCP - Cardiology (Cardiology) Constance Haw, MD as PCP - Electrophysiology (Cardiology) Osker Mason, RPH-CPP (Pharmacist) Ethelda Chick as Social Worker  Patient Active Problem List   Diagnosis Date Noted   Peripheral vascular disease, unspecified ([place]) 03/22/2022   Orbit fracture, left, closed, initial encounter ([place]) 03/22/2022   Dyslipidemia 02/03/2022   Type 2 diabetes mellitus with diabetic polyneuropathy, with long-term current use of insulin ([place]) 02/03/2022   Type 2 diabetes mellitus with hyperglycemia, with long-term current use of insulin ([place]) 02/03/2022   Microalbuminuria 02/03/2022   Homelessness 11/19/2021   Right great toe amputee ([place]) 11/03/2021   Class 2 severe obesity due to excess calories with serious comorbidity and body mass index (BMI) of 36.0 to 36.9 in adult [facility]) 10/15/2021   Insomnia 10/07/2021   Localized  edema 10/07/2021   Left below-knee amputee ([place]) 07/01/2021   Normocytic anemia 06/26/2021   Heart failure with mid-range ejection fraction ([place]) 06/26/2021   Diabetic nephropathy with proteinuria ([place]) 06/26/2021   Dental caries 04/01/2020   Cracked tooth 04/01/2020   Alcohol use 03/11/2020   Vitamin D deficiency 03/11/2020   Prolonged QT interval 02/26/2020   Chronic combined systolic and diastolic CHF (congestive heart failure) ([place]) 02/26/2020   Heart block AV complete ([place]) 02/11/2020   Exocrine pancreatic insufficiency    Dysmenorrhea 10/02/2019   DM type 2 with diabetic peripheral neuropathy ([place]) 05/30/2019   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 block) 05/29/2019   Type 2 diabetes mellitus ([place]) 10/21/2016   Former smoker 11/26/2013   Hypercholesteremia 02/02/2007   Bipolar 1 disorder ([place]) 02/02/2007   HYPERTENSION, BENIGN SYSTEMIC 02/02/2007    Current Outpatient Medications:    acetaminophen (TYLENOL) 325 MG tablet, Take 650 mg by mouth every 6 (six) hours as needed (pain)., Disp: , Rfl:    atorvastatin (LIPITOR) 20 MG tablet, Take 1 tablet (20 mg total) by mouth daily., Disp: 90 tablet, Rfl: 3   Blood Pressure Monitoring (BLOOD PRESSURE MONITOR 7) DEVI, Measure blood pressure daily, Disp: 1 each, Rfl: 0   carvedilol (COREG) 6.25 MG tablet, Take 1 tablet (6.25 mg total) by mouth 2 (two) times daily with a meal., Disp: 180 tablet, Rfl: 3   Continuous Blood Gluc Sensor (DEXCOM G6 SENSOR) MISC,  1 Device by Does not apply route as directed., Disp: 9 each, Rfl: 3   Continuous Blood Gluc Transmit (DEXCOM G6 TRANSMITTER) MISC, 1 Device by Does not apply route as directed., Disp: 1 each, Rfl: 3   furosemide (LASIX) 20 MG tablet, Take 1 tablet (20 mg total) by mouth daily., Disp: 90 tablet, Rfl: 3   insulin glargine (LANTUS SOLOSTAR) 100 UNIT/ML Solostar Pen, Inject 60 Units into the skin daily., Disp: 30 mL, Rfl: 6   insulin lispro (HUMALOG) 100 UNIT/ML  KwikPen, use as directed (max daily dose: 60 units), Disp: 45 mL, Rfl: 6   Insulin Pen Needle 31G X 5 MM MISC, Use as directed in the morning, at noon, in the evening, and at bedtime., Disp: 400 each, Rfl: 3   losartan (COZAAR) 25 MG tablet, Take 2 tablets (50 mg total) by mouth daily., Disp: 90 tablet, Rfl: 3   Multiple Vitamins-Minerals (ADULT ONE DAILY GUMMIES PO), Take 1 capsule by mouth daily., Disp: , Rfl:    naproxen sodium (ALEVE) 220 MG tablet, Take 220 mg by mouth daily as needed (pain)., Disp: , Rfl:    spironolactone (ALDACTONE) 25 MG tablet, Take 0.5 tablets (12.5 mg total) by mouth daily., Disp: 45 tablet, Rfl: 3 Allergies  Allergen Reactions   Strawberry Extract Hives   Sulfa Antibiotics Hives and Itching      Social History   Socioeconomic History   Marital status: Significant Other    Spouse name: Not on file   Number of children: Not on file   Years of education: Not on file   Highest education level: Not on file  Occupational History   Not on file  Tobacco Use   Smoking status: Former    Packs/day: 0.50    Years: 20.00    Total pack years: 10.00    Types: Cigarettes    Quit date: 07/01/2021    Years since quitting: 0.8   Smokeless tobacco: Never  Vaping Use   Vaping Use: Never used  Substance and Sexual Activity   Alcohol use: No   Drug use: No   Sexual activity: Yes    Birth control/protection: None  Other Topics Concern   Not on file  Social History Narrative   Not on file   Social Determinants of Health   Financial Resource Strain: High Risk (05/13/2022)   Overall Financial Resource Strain (CARDIA)    Difficulty of Paying Living Expenses: Hard  Food Insecurity: Food Insecurity Present (05/13/2022)   Hunger Vital Sign    Worried About Running Out of Food in the Last Year: Often true    Ran Out of Food in the Last Year: Often true  Transportation Needs: Unmet Transportation Needs (04/01/2022)   PRAPARE - Hydrologist  (Medical): Yes    Lack of Transportation (Non-Medical): Yes  Physical Activity: Not on file  Stress: Not on file  Social Connections: Not on file  Intimate Partner Violence: Not on file    Physical Exam      Future Appointments  Date Time Provider [place]  05/27/2022 10:00 AM [facility] CCC-MM PHARMACIST THN-CCC None  05/27/2022 12:30 PM [facility] LAB MC-[facility] None  06/03/2022  9:40 AM Raulkar, Clide Deutscher, MD CPR-PRMA CPR  06/14/2022 10:10 AM Shamleffer, Melanie Crazier, MD [facility] None  07/27/2022  6:10 PM [facility] DEVICE REMOTES [facility]  07/29/2022 10:00 AM Larey Dresser, MD MC-[facility]SC None  09/07/2022 10:20 AM Ranell Patrick, Clide Deutscher, MD CPR-PR[facility] CPR  10/26/2022  6:10 PM [facility] DEVICE REMOTES [facility]  12/09/2022  9:45 AM Raulkar, Clide Deutscher, MD CPR-PRMA CPR  01/25/2023  6:10 PM [facility] DEVICE REMOTES [facility]       [place], [place] Paramedic  05/20/22

## 2022-05-24 ENCOUNTER — Telehealth (HOSPITAL_COMMUNITY): Payer: Self-pay | Admitting: Licensed Clinical Social Worker

## 2022-05-24 NOTE — Telephone Encounter (Signed)
H&V Care Navigation CSW Progress Note  Clinical Social Worker contacted patient by phone to check in regarding housing.  Patient is participating in a Managed Medicaid Plan:  Yes  SDOH Screenings   Alcohol Screen: Not on file  Depression (PHQ2-9): Medium Risk (03/22/2022)   Depression (PHQ2-9)    PHQ-2 Score: 12  Financial Resource Strain: High Risk (05/13/2022)   Overall Financial Resource Strain (CARDIA)    Difficulty of Paying Living Expenses: Hard  Food Insecurity: Food Insecurity Present (05/13/2022)   Hunger Vital Sign    Worried About Running Out of Food in the Last Year: Often true    Ran Out of Food in the Last Year: Often true  Housing: Medium Risk (05/17/2022)   Housing    Last Housing Risk Score: 1  Physical Activity: Not on file  Social Connections: Not on file  Stress: Not on file  Tobacco Use: Medium Risk (05/19/2022)   Patient History    Smoking Tobacco Use: Former    Smokeless Tobacco Use: Never    Passive Exposure: Not on file  Transportation Needs: Unmet Transportation Needs (04/01/2022)   PRAPARE - Hydrologist (Medical): Yes    Lack of Transportation (Non-Medical): Yes    Pt reports that a friend from church ended up being able to pay for another week at hotel.  She now has until this Friday to find alternative housing.  States her son applied for work today so hopeful this will be a source of income soon.  She has also started trying to find work but is worried about getting disability as well.   Pt reports if she can't get additional assistance and has to move out Friday her plan would be for her children to stay with their father temporarily and she might stay with a friend or in her [place].  CSW still trying to find options for patient but nothing available at this time.  Will continue to follow and assist as needed  Jorge Ny, [place] [facility] Desk#: [phone] Cell#:  [phone]

## 2022-05-27 ENCOUNTER — Ambulatory Visit (HOSPITAL_COMMUNITY)
Admission: RE | Admit: 2022-05-27 | Discharge: 2022-05-27 | Disposition: A | Discharge status: Home / Self Care | Payer: Medicaid Other | Source: Ambulatory Visit | Attending: Internal Medicine | Admitting: Internal Medicine

## 2022-05-27 ENCOUNTER — Telehealth (HOSPITAL_COMMUNITY): Payer: Self-pay | Admitting: Licensed Clinical Social Worker

## 2022-05-27 ENCOUNTER — Ambulatory Visit: Payer: Self-pay

## 2022-05-27 DIAGNOSIS — I5022 Chronic systolic (congestive) heart failure: Secondary | ICD-10-CM | POA: Diagnosis not present

## 2022-05-27 LAB — BASIC METABOLIC PANEL
Anion gap: 12 (ref 5–15)
BUN: 24 mg/dL — ABNORMAL HIGH (ref 6–20)
CO2: 20 mmol/L — ABNORMAL LOW (ref 22–32)
Calcium: 9.1 mg/dL (ref 8.9–10.3)
Chloride: 105 mmol/L (ref 98–111)
Creatinine, Ser: 1.18 mg/dL — ABNORMAL HIGH (ref 0.44–1.00)
GFR, Estimated: 57 mL/min — ABNORMAL LOW (ref >60)
Glucose, Bld: 140 mg/dL — ABNORMAL HIGH (ref 70–99)
Potassium: 4.7 mmol/L (ref 3.5–5.1)
Sodium: 137 mmol/L (ref 135–145)

## 2022-05-27 NOTE — Telephone Encounter (Signed)
H&V Care Navigation CSW Progress Note  Clinical Social Worker contacted patient by phone to discuss disability case.  Patient is participating in a Managed Medicaid Plan:  Yes  SDOH Screenings   Alcohol Screen: Not on file  Depression (PHQ2-9): Medium Risk (03/22/2022)   Depression (PHQ2-9)    PHQ-2 Score: 12  Financial Resource Strain: High Risk (05/13/2022)   Overall Financial Resource Strain (CARDIA)    Difficulty of Paying Living Expenses: Hard  Food Insecurity: Food Insecurity Present (05/13/2022)   Hunger Vital Sign    Worried About Running Out of Food in the Last Year: Often true    Ran Out of Food in the Last Year: Often true  Housing: Medium Risk (05/17/2022)   Housing    Last Housing Risk Score: 1  Physical Activity: Not on file  Social Connections: Not on file  Stress: Not on file  Tobacco Use: Medium Risk (05/19/2022)   Patient History    Smoking Tobacco Use: Former    Smokeless Tobacco Use: Never    Passive Exposure: Not on file  Transportation Needs: Unmet Transportation Needs (04/01/2022)   PRAPARE - Hydrologist (Medical): Yes    Lack of Transportation (Non-Medical): Yes     Pt called CSW to request help with DDS case- states that she spoke with them and they are waiting on medical records from [facility] and that is the only hold up.  CSW found medical records requests on file so sent requested documents to help expedite this process.   Will continue to follow and assist as needed  Jorge Ny, [place] [facility] Desk#: [phone] Cell#: [phone]

## 2022-05-28 ENCOUNTER — Other Ambulatory Visit (HOSPITAL_COMMUNITY): Payer: Self-pay | Admitting: Emergency Medicine

## 2022-05-28 NOTE — Progress Notes (Signed)
Paramedicine Encounter    Patient ID: Sarah Long, female    DOB: [birth year], 51 y.o.   MRN: [id number]   There were no vitals taken for this visit. Weight yesterday-not taken Last visit weight-199 lb  ATF Ms. Trevathan A&O x4, skin warm and dry with good color.  Pt has no complaints of chest pain or SOB.  Lung sounds clear and equal bilat.  She missed 6 of 7 evening doses of her p.m. Carvedilol since my last med rec.  Discussed ways to not miss meds in the future and her children said they would try and help encourage her.  Ms. 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  She told me that she should hear something today and feels like this maybe a positive turning point for her if she can start working.  She is still having difficulty paying her rent so not sure how this will impact her in the near future.  Pt is still smoking quite a lot.  Talked to her about quitting but she says she's stressed out about her current living situation.  Will follow up with her next week.  Patient Care Team: [name], MD as PCP - General (Pulmonary Disease) Quintella Reichert, MD as PCP - Cardiology (Cardiology) Regan Lemming, MD as PCP - Electrophysiology (Cardiology) Alden Hipp, RPH-CPP (Pharmacist) Shaune Leeks as Social Worker  Patient Active Problem List   Diagnosis Date Noted   Peripheral vascular disease, unspecified (HCC) 03/22/2022   Orbit fracture, left, closed, initial encounter (HCC) 03/22/2022   Dyslipidemia 02/03/2022   Type 2 diabetes mellitus with diabetic polyneuropathy, with long-term current use of insulin (HCC) 02/03/2022   Type 2 diabetes mellitus with hyperglycemia, with long-term current use of insulin (HCC) 02/03/2022   Microalbuminuria 02/03/2022   Homelessness 11/19/2021   Right great toe amputee (HCC) 11/03/2021   Class 2 severe obesity due to excess calories with serious comorbidity and body mass index (BMI) of 36.0 to 36.9 in adult  [facility]) 10/15/2021   Insomnia 10/07/2021   Localized edema 10/07/2021   Left below-knee amputee (HCC) 07/01/2021   Normocytic anemia 06/26/2021   Heart failure with mid-range ejection fraction (HCC) 06/26/2021   Diabetic nephropathy with proteinuria (HCC) 06/26/2021   Dental caries 04/01/2020   Cracked tooth 04/01/2020   Alcohol use 03/11/2020   Vitamin D deficiency 03/11/2020   Prolonged QT interval 02/26/2020   Chronic combined systolic and diastolic CHF (congestive heart failure) (HCC) 02/26/2020   Heart block AV complete (HCC) 02/11/2020   Exocrine pancreatic insufficiency    Dysmenorrhea 10/02/2019   DM type 2 with diabetic peripheral neuropathy (HCC) 05/30/2019   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 block) 05/29/2019   Type 2 diabetes mellitus (HCC) 10/21/2016   Former smoker 11/26/2013   Hypercholesteremia 02/02/2007   Bipolar 1 disorder (HCC) 02/02/2007   HYPERTENSION, BENIGN SYSTEMIC 02/02/2007    Current Outpatient Medications:    acetaminophen (TYLENOL) 325 MG tablet, Take 650 mg by mouth every 6 (six) hours as needed (pain)., Disp: , Rfl:    atorvastatin (LIPITOR) 20 MG tablet, Take 1 tablet (20 mg total) by mouth daily., Disp: 90 tablet, Rfl: 3   Blood Pressure Monitoring (BLOOD PRESSURE MONITOR 7) DEVI, Measure blood pressure daily, Disp: 1 each, Rfl: 0   carvedilol (COREG) 6.25 MG tablet, Take 1 tablet (6.25 mg total) by mouth 2 (two) times daily with a meal., Disp: 180 tablet, Rfl: 3  Continuous Blood Gluc Sensor (DEXCOM G6 SENSOR) MISC, 1 Device by Does not apply route as directed., Disp: 9 each, Rfl: 3   Continuous Blood Gluc Transmit (DEXCOM G6 TRANSMITTER) MISC, 1 Device by Does not apply route as directed., Disp: 1 each, Rfl: 3   furosemide (LASIX) 20 MG tablet, Take 1 tablet (20 mg total) by mouth daily., Disp: 90 tablet, Rfl: 3   insulin glargine (LANTUS SOLOSTAR) 100 UNIT/ML Solostar Pen, Inject 60 Units into the skin daily., Disp: 30  mL, Rfl: 6   insulin lispro (HUMALOG) 100 UNIT/ML KwikPen, use as directed (max daily dose: 60 units), Disp: 45 mL, Rfl: 6   Insulin Pen Needle 31G X 5 MM MISC, Use as directed in the morning, at noon, in the evening, and at bedtime., Disp: 400 each, Rfl: 3   losartan (COZAAR) 25 MG tablet, Take 2 tablets (50 mg total) by mouth daily., Disp: 90 tablet, Rfl: 3   Multiple Vitamins-Minerals (ADULT ONE DAILY GUMMIES PO), Take 1 capsule by mouth daily. (Patient not taking: Reported on 05/20/2022), Disp: , Rfl:    naproxen sodium (ALEVE) 220 MG tablet, Take 220 mg by mouth daily as needed (pain). (Patient not taking: Reported on 05/20/2022), Disp: , Rfl:    spironolactone (ALDACTONE) 25 MG tablet, Take 0.5 tablets (12.5 mg total) by mouth daily., Disp: 45 tablet, Rfl: 3 Allergies  Allergen Reactions   Strawberry Extract Hives   Sulfa Antibiotics Hives and Itching      Social History   Socioeconomic History   Marital status: Significant Other    Spouse name: Not on file   Number of children: Not on file   Years of education: Not on file   Highest education level: Not on file  Occupational History   Not on file  Tobacco Use   Smoking status: Former    Packs/day: 0.50    Years: 20.00    Total pack years: 10.00    Types: Cigarettes    Quit date: 07/01/2021    Years since quitting: 0.9   Smokeless tobacco: Never  Vaping Use   Vaping Use: Never used  Substance and Sexual Activity   Alcohol use: No   Drug use: No   Sexual activity: Yes    Birth control/protection: None  Other Topics Concern   Not on file  Social History Narrative   Not on file   Social Determinants of Health   Financial Resource Strain: High Risk (05/13/2022)   Overall Financial Resource Strain (CARDIA)    Difficulty of Paying Living Expenses: Hard  Food Insecurity: Food Insecurity Present (05/13/2022)   Hunger Vital Sign    Worried About Running Out of Food in the Last Year: Often true    Ran Out of Food in the  Last Year: Often true  Transportation Needs: Unmet Transportation Needs (04/01/2022)   PRAPARE - Administrator, Civil Service (Medical): Yes    Lack of Transportation (Non-Medical): Yes  Physical Activity: Not on file  Stress: Not on file  Social Connections: Not on file  Intimate Partner Violence: Not on file    Physical Exam      Future Appointments  Date Time Provider Department Center  06/14/2022 10:10 AM Shamleffer, Konrad Dolores, MD [facility] None  06/21/2022 10:00 AM [facility] CCC-MM PHARMACIST [facility]CCC None  07/27/2022  6:10 PM [facility] DEVICE REMOTES [facility]  07/29/2022 10:00 AM Laurey Morale, MD [facility] None  09/07/2022 10:20 AM Carlis Abbott, Drema Pry, MD [facility]-PR[facility] CPR  10/26/2022  6:10 PM [facility] DEVICE REMOTES [facility]  12/09/2022  9:45 AM Raulkar, Drema Pry, MD CPR-[facility] CPR  01/25/2023  6:10 PM [facility] DEVICE REMOTES [facility]       Bethanie Dicker [phone] [facility] Paramedic  05/28/22

## 2022-05-31 DIAGNOSIS — E119 Type 2 diabetes mellitus without complications: Secondary | ICD-10-CM | POA: Diagnosis not present

## 2022-05-31 DIAGNOSIS — H43393 Other vitreous opacities, bilateral: Secondary | ICD-10-CM | POA: Diagnosis not present

## 2022-05-31 DIAGNOSIS — H2513 Age-related nuclear cataract, bilateral: Secondary | ICD-10-CM | POA: Diagnosis not present

## 2022-05-31 DIAGNOSIS — H40013 Open angle with borderline findings, low risk, bilateral: Secondary | ICD-10-CM | POA: Diagnosis not present

## 2022-06-02 ENCOUNTER — Other Ambulatory Visit (HOSPITAL_COMMUNITY): Payer: Self-pay | Admitting: Cardiology

## 2022-06-03 ENCOUNTER — Encounter: Payer: Medicaid Other | Admitting: Physical Medicine and Rehabilitation

## 2022-06-09 ENCOUNTER — Other Ambulatory Visit (HOSPITAL_COMMUNITY): Payer: Self-pay | Admitting: Emergency Medicine

## 2022-06-09 ENCOUNTER — Telehealth (HOSPITAL_COMMUNITY): Payer: Self-pay | Admitting: Emergency Medicine

## 2022-06-09 NOTE — Telephone Encounter (Signed)
Left message to follow up on her new job and to try and schedule home visit for this week. Asked her to return my call at her earliest convenience.

## 2022-06-09 NOTE — Progress Notes (Signed)
Paramedicine Encounter    Patient ID: Sarah Long, female    DOB: 1971-07-02, 51 y.o.   MRN: [id number]   BP 110/68 (BP Location: Left Arm, Patient Position: Sitting, Cuff Size: Normal)   Pulse 74   Resp 16   Wt 197 lb 6.4 oz (89.5 kg)   SpO2 100%   BMI 33.88 kg/m  Weight yesterday-did not weigh Last visit weight-204lb. CBG 170  Home visit with [name] today and she reports to be feeling well.  No chest pain or SOB.  No edema noted and lung sounds clear and equal bilat.  She has not been taking her evening carvedilol and says she forgets so I got her to set an alarm on her phone to help her remember.  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  She tried to replace some of her meds herself and I asked her in the future not to do that so I could better keep track of med compliance.  Also needs to work on her nutrition.  She eats a lot of snack foods and candy. Home visit complete.  Patient Care Team: [name], MD as PCP - General (Pulmonary Disease) Sueanne Margarita, MD as PCP - Cardiology (Cardiology) Constance Haw, MD as PCP - Electrophysiology (Cardiology) Osker Mason, RPH-CPP (Pharmacist) Ethelda Chick as Social Worker  Patient Active Problem List   Diagnosis Date Noted   Peripheral vascular disease, unspecified ([place]) 03/22/2022   Orbit fracture, left, closed, initial encounter ([place]) 03/22/2022   Dyslipidemia 02/03/2022   Type 2 diabetes mellitus with diabetic polyneuropathy, with long-term current use of insulin ([place]) 02/03/2022   Type 2 diabetes mellitus with hyperglycemia, with long-term current use of insulin ([place]) 02/03/2022   Microalbuminuria 02/03/2022   Homelessness 11/19/2021   Right great toe amputee ([place]) 11/03/2021   Class 2 severe obesity due to excess calories with serious comorbidity and body mass index (BMI) of 36.0 to 36.9 in adult [facility]) 10/15/2021   Insomnia 10/07/2021   Localized edema  10/07/2021   Left below-knee amputee ([place]) 07/01/2021   Normocytic anemia 06/26/2021   Heart failure with mid-range ejection fraction ([place]) 06/26/2021   Diabetic nephropathy with proteinuria ([place]) 06/26/2021   Dental caries 04/01/2020   Cracked tooth 04/01/2020   Alcohol use 03/11/2020   Vitamin D deficiency 03/11/2020   Prolonged QT interval 02/26/2020   Chronic combined systolic and diastolic CHF (congestive heart failure) ([place]) 02/26/2020   Heart block AV complete ([place]) 02/11/2020   Exocrine pancreatic insufficiency    Dysmenorrhea 10/02/2019   DM type 2 with diabetic peripheral neuropathy ([place]) 05/30/2019   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 block) 05/29/2019   Type 2 diabetes mellitus ([place]) 10/21/2016   Former smoker 11/26/2013   Hypercholesteremia 02/02/2007   Bipolar 1 disorder ([place]) 02/02/2007   HYPERTENSION, BENIGN SYSTEMIC 02/02/2007    Current Outpatient Medications:    acetaminophen (TYLENOL) 325 MG tablet, Take 650 mg by mouth every 6 (six) hours as needed (pain)., Disp: , Rfl:    atorvastatin (LIPITOR) 20 MG tablet, Take 1 tablet (20 mg total) by mouth daily., Disp: 90 tablet, Rfl: 3   carvedilol (COREG) 6.25 MG tablet, Take 1 tablet (6.25 mg total) by mouth 2 (two) times daily with a meal., Disp: 180 tablet, Rfl: 3   Continuous Blood Gluc Sensor (DEXCOM G6 SENSOR) MISC, 1 Device by Does not apply route as directed., Disp: 9  each, Rfl: 3   Continuous Blood Gluc Transmit (DEXCOM G6 TRANSMITTER) MISC, 1 Device by Does not apply route as directed., Disp: 1 each, Rfl: 3   furosemide (LASIX) 20 MG tablet, Take 1 tablet (20 mg total) by mouth daily., Disp: 90 tablet, Rfl: 3   insulin glargine (LANTUS SOLOSTAR) 100 UNIT/ML Solostar Pen, Inject 60 Units into the skin daily., Disp: 30 mL, Rfl: 6   insulin lispro (HUMALOG) 100 UNIT/ML KwikPen, use as directed (max daily dose: 60 units), Disp: 45 mL, Rfl: 6   Insulin Pen Needle 31G X 5 MM MISC, Use as  directed in the morning, at noon, in the evening, and at bedtime., Disp: 400 each, Rfl: 3   losartan (COZAAR) 25 MG tablet, Take 2 tablets (50 mg total) by mouth daily., Disp: 90 tablet, Rfl: 3   naproxen sodium (ALEVE) 220 MG tablet, Take 220 mg by mouth daily as needed (pain)., Disp: , Rfl:    spironolactone (ALDACTONE) 25 MG tablet, Take 0.5 tablets (12.5 mg total) by mouth daily., Disp: 45 tablet, Rfl: 3   Blood Pressure Monitoring (BLOOD PRESSURE MONITOR 7) DEVI, Measure blood pressure daily (Patient not taking: Reported on 05/28/2022), Disp: 1 each, Rfl: 0   Multiple Vitamins-Minerals (ADULT ONE DAILY GUMMIES PO), Take 1 capsule by mouth daily. (Patient not taking: Reported on 05/20/2022), Disp: , Rfl:  Allergies  Allergen Reactions   Strawberry Extract Hives   Sulfa Antibiotics Hives and Itching      Social History   Socioeconomic History   Marital status: Significant Other    Spouse name: Not on file   Number of children: Not on file   Years of education: Not on file   Highest education level: Not on file  Occupational History   Not on file  Tobacco Use   Smoking status: Former    Packs/day: 0.50    Years: 20.00    Total pack years: 10.00    Types: Cigarettes    Quit date: 07/01/2021    Years since quitting: 0.9   Smokeless tobacco: Never  Vaping Use   Vaping Use: Never used  Substance and Sexual Activity   Alcohol use: No   Drug use: No   Sexual activity: Yes    Birth control/protection: None  Other Topics Concern   Not on file  Social History Narrative   Not on file   Social Determinants of Health   Financial Resource Strain: High Risk (05/13/2022)   Overall Financial Resource Strain (CARDIA)    Difficulty of Paying Living Expenses: Hard  Food Insecurity: Food Insecurity Present (05/13/2022)   Hunger Vital Sign    Worried About Running Out of Food in the Last Year: Often true    Ran Out of Food in the Last Year: Often true  Transportation Needs: Unmet  Transportation Needs (04/01/2022)   PRAPARE - Hydrologist (Medical): Yes    Lack of Transportation (Non-Medical): Yes  Physical Activity: Not on file  Stress: Not on file  Social Connections: Not on file  Intimate Partner Violence: Not on file    Physical Exam      Future Appointments  Date Time Provider [place]  06/14/2022 10:10 AM Shamleffer, Melanie Crazier, MD [facility] None  06/21/2022 10:00 AM [facility]-MM PHARMACIST [facility] None  07/27/2022  6:10 PM [facility] DEVICE REMOTES [facility]  07/29/2022 10:00 AM Larey Dresser, MD [facility] None  09/07/2022 10:20 AM Ranell Patrick, Clide Deutscher, MD [facility] CPR  10/26/2022  6:10 PM [facility] DEVICE REMOTES [facility]  12/09/2022  9:45 AM Raulkar, Clide Deutscher, MD [facility] CPR  01/25/2023  6:10 PM [facility] DEVICE REMOTES [facility]       [place], [place] Paramedic  06/09/22

## 2022-06-14 ENCOUNTER — Encounter: Payer: Self-pay | Admitting: Internal Medicine

## 2022-06-14 ENCOUNTER — Telehealth: Payer: Self-pay

## 2022-06-14 ENCOUNTER — Ambulatory Visit (INDEPENDENT_AMBULATORY_CARE_PROVIDER_SITE_OTHER): Payer: Medicaid Other | Admitting: Internal Medicine

## 2022-06-14 VITALS — BP 126/80 | HR 88 | Ht 64.0 in | Wt 220.0 lb

## 2022-06-14 DIAGNOSIS — E1165 Type 2 diabetes mellitus with hyperglycemia: Secondary | ICD-10-CM | POA: Diagnosis not present

## 2022-06-14 DIAGNOSIS — Z794 Long term (current) use of insulin: Secondary | ICD-10-CM

## 2022-06-14 DIAGNOSIS — E1122 Type 2 diabetes mellitus with diabetic chronic kidney disease: Secondary | ICD-10-CM | POA: Diagnosis not present

## 2022-06-14 DIAGNOSIS — E1142 Type 2 diabetes mellitus with diabetic polyneuropathy: Secondary | ICD-10-CM

## 2022-06-14 DIAGNOSIS — N181 Chronic kidney disease, stage 1: Secondary | ICD-10-CM

## 2022-06-14 LAB — POCT GLYCOSYLATED HEMOGLOBIN (HGB A1C): Hemoglobin A1C: 8.5 % — AB (ref 4.0–5.6)

## 2022-06-14 MED ORDER — LANTUS SOLOSTAR 100 UNIT/ML ~~LOC~~ SOPN: 70.0000 [IU] | PEN_INJECTOR | Freq: Every day | SUBCUTANEOUS | 3 refills | Status: DC

## 2022-06-14 MED ORDER — INSULIN LISPRO (1 UNIT DIAL) 100 UNIT/ML (KWIKPEN): PEN_INJECTOR | SUBCUTANEOUS | 3 refills | Status: DC

## 2022-06-14 MED ORDER — OMNIPOD 5 DEXG7G6 INTRO GEN 5 KIT: 1.0000 | PACK | 0 refills | Status: DC

## 2022-06-14 MED ORDER — OMNIPOD 5 DEXG7G6 PODS GEN 5 MISC
1.0000 | 3 refills | Status: DC
Start: 2022-06-14 — End: 2023-06-30

## 2022-06-14 NOTE — Telephone Encounter (Signed)
Patient states that new script needs to be sent with new directions for the Humalog

## 2022-06-14 NOTE — Progress Notes (Signed)
Name: Sarah Long  MRN/ DOB: [id number], 02-27-1971   Age/ Sex: 51 y.o., female    PCP: [name], MD   Reason for Endocrinology Evaluation: Type 2 Diabetes Mellitus     Date of Initial Endocrinology Visit: 11/02/2021    PATIENT IDENTIFIER: Sarah Long is a 51 y.o. female with a past medical history of HTN , Bipolar d/o,and T2DM, has Hx of Pancreatitis . The patient presented for initial endocrinology clinic visit on 11/02/2021 for consultative assistance with her diabetes management.    HPI: Sarah Long was    Diagnosed with DM at age 46 stated as gestational diabetes by age 6 she became diagnosed with T2 DM  Prior Medications tried/Intolerance: Intolerant to Metformin.  Hemoglobin A1c has ranged from 7.4% in 2022, peaking at 13.8% in 2014. Patient has required hospitalization within the last 1 year from hyper or hypoglycemia: DKA 06/2021   On her initial visit to our clinic she had an A1c 7.4%, we continued MDI regimen and provided her with correction scale   SUBJECTIVE:   During the last visit (02/03/2022): A1c 7.9% continued MDI regimen . Dexcom prescribed   Today (06/14/22): Sarah Long is here for a follow up on diabetes management. She checks her  blood sugars multiple times daily through CGM.  The patient has  not had hypoglycemic episodes since the last clinic visit.   She admits to dietary indiscretions  Has morning in the morning but no diarrhea or vomiting  Has occasional right LE edema , she is on spironolactone and lasix     HOME DIABETES REGIMEN: Lantus 60 units daily  Humalog 12 units TID QAC CF: Humalog (BG -130/25)       Statin: no ACE-I/ARB: no Prior Diabetic Education: yes   CONTINUOUS GLUCOSE MONITORING RECORD INTERPRETATION    Dates of Recording: 6/27-7/09/2022  Sensor description:dexcom  Results statistics:   CGM use % of time 93  Average and SD 217/57  Time in range   26    %  % Time Above 180 44  % Time above 250 30   % Time Below target 0      Glycemic patterns summary: Bg's elevated during the day and night   Hyperglycemic episodes postprandial   Hypoglycemic episodes occurred n/a  Overnight periods:remains high    DIABETIC COMPLICATIONS: Microvascular complications:  Left BKA, Right  great toe amputation , neuropathy  Denies: CKD, retinopathy  Last eye exam: Completed 01/2021  Macrovascular complications:  PAD Denies: CAD, CVA   PAST HISTORY: Past Medical History:  Past Medical History:  Diagnosis Date   Abnormal Pap smear 1998   Abnormal vaginal bleeding 12/19/2011   Acute urinary retention 06/26/2021   Arthritis    Bacterial infection    Bipolar 1 disorder (HCC)    Blister of second toe of left foot 11/21/2016   Candida vaginitis 07/2007   Depression    recently added wellbutrin-has not taken yet for bipolar   Diabetes in pregnancy    Diabetes mellitus    nph 20U qam and qpm, regular with meals   Diabetic ketoacidosis without coma associated with type 2 diabetes mellitus ([place])    Fibroid    Galactorrhea of right breast 2008   GERD (gastroesophageal reflux disease)    H/O amenorrhea 07/2007   H/O dizziness 10/14/2011   H/O dysmenorrhea 2010   H/O menorrhagia 10/14/2011   H/O varicella    Headache(784.0)    Heavy vaginal bleeding due to contraceptive injection  use 10/12/2011   Depo Provera   Herpes    HSV-2 infection 01/03/2009   Hx: UTI (urinary tract infection) 2009   Hypertension    on aldomet   Hypoalbuminemia due to protein-calorie malnutrition ([place]) 06/26/2021   Increased BMI 2010   Irregular uterine bleeding 04/04/2012   Pt has mirena    Obesity 10/14/2011   Oligomenorrhea 07/2007   Osteomyelitis of great toe of right foot ([place]) 03/22/2022   Pelvic pain in female    Presence of permanent cardiac pacemaker    Preterm labor    Trichomonas    Yeast infection    Past Surgical History:  Past Surgical History:  Procedure Laterality Date   ABDOMINAL  AORTOGRAM W/LOWER EXTREMITY N/A 06/17/2021   Procedure: ABDOMINAL AORTOGRAM W/LOWER EXTREMITY;  Surgeon: Wellington Hampshire, MD;  Location: [facility];  Service: Cardiovascular;  Laterality: N/A;   AMPUTATION Left 06/27/2021   Procedure: AMPUTATION BELOW KNEE;  Surgeon: Newt Minion, MD;  Location: [facility];  Service: Orthopedics;  Laterality: Left;   AMPUTATION Right 09/04/2021   Procedure: RIGHT GREAT TOE AMPUTATION;  Surgeon: Newt Minion, MD;  Location: [facility];  Service: Orthopedics;  Laterality: Right;   CESAREAN SECTION  1991   LEFT HEART CATH AND CORONARY ANGIOGRAPHY N/A 02/10/2020   Procedure: LEFT HEART CATH AND CORONARY ANGIOGRAPHY;  Surgeon: Troy Sine, MD;  Location: [facility];  Service: Cardiovascular;  Laterality: N/A;   PACEMAKER IMPLANT N/A 02/13/2020   Procedure: PACEMAKER IMPLANT;  Surgeon: Constance Haw, MD;  Location: [facility];  Service: Cardiovascular;  Laterality: N/A;   TEMPORARY PACEMAKER N/A 02/10/2020   Procedure: TEMPORARY PACEMAKER;  Surgeon: Troy Sine, MD;  Location: [facility];  Service: Cardiovascular;  Laterality: N/A;    Social History:  reports that she quit smoking about a year ago. Her smoking use included cigarettes. She has a 10.00 pack-year smoking history. She has never used smokeless tobacco. She reports that she does not drink alcohol and does not use drugs. Family History:  Family History  Problem Relation Age of Onset   Hypertension Mother    Diabetes Mother    Heart disease Mother    Hypertension Father    Diabetes Father    Heart disease Father    Stroke Maternal Grandfather    Other Neg Hx      HOME MEDICATIONS: Allergies as of 06/14/2022       Reactions   Strawberry Extract Hives   Sulfa Antibiotics Hives, Itching        Medication List        Accurate as of June 14, 2022 10:16 AM. If you have any questions, ask your nurse or doctor.          acetaminophen 325 MG  tablet Commonly known as: TYLENOL Take 650 mg by mouth every 6 (six) hours as needed (pain).   ADULT ONE DAILY GUMMIES PO Take 1 capsule by mouth daily.   atorvastatin 20 MG tablet Commonly known as: LIPITOR Take 1 tablet (20 mg total) by mouth daily.   B-D UF III MINI PEN NEEDLES 31G X 5 MM Misc Generic drug: Insulin Pen Needle Use as directed in the morning, at noon, in the evening, and at bedtime.   Blood Pressure Monitor 7 Devi Measure blood pressure daily   carvedilol 6.25 MG tablet Commonly known as: COREG Take 1 tablet (6.25 mg total) by mouth 2 (two) times daily with a  meal.   Dexcom G6 Sensor Misc 1 Device by Does not apply route as directed.   Dexcom G6 Transmitter Misc 1 Device by Does not apply route as directed.   furosemide 20 MG tablet Commonly known as: LASIX Take 1 tablet (20 mg total) by mouth daily.   HumaLOG KwikPen 100 UNIT/ML KwikPen Generic drug: insulin lispro use as directed (max daily dose: 60 units)   Lantus SoloStar 100 UNIT/ML Solostar Pen Generic drug: insulin glargine Inject 60 Units into the skin daily.   losartan 25 MG tablet Commonly known as: COZAAR Take 2 tablets (50 mg total) by mouth daily.   naproxen sodium 220 MG tablet Commonly known as: ALEVE Take 220 mg by mouth daily as needed (pain).   spironolactone 25 MG tablet Commonly known as: ALDACTONE Take 0.5 tablets (12.5 mg total) by mouth daily.         ALLERGIES: Allergies  Allergen Reactions   Strawberry Extract Hives   Sulfa Antibiotics Hives and Itching     REVIEW OF SYSTEMS: A comprehensive ROS was conducted with the patient and is negative except as per HPI     OBJECTIVE:   VITAL SIGNS: BP 126/80 (BP Location: Left Arm, Patient Position: Sitting, Cuff Size: Small)   Pulse 88   Ht '5\' 4"'$  (1.626 m)   Wt 220 lb (99.8 kg)   SpO2 99%   BMI 37.76 kg/m    PHYSICAL EXAM:  General: Pt appears well and is in NAD  Lungs: Clear with good BS bilat with no  rales, rhonchi, or wheezes  Heart: RRR with normal S1 and S2 and no gallops; no murmurs; no rub  Extremities: Left BKA  Trace edema on the right   Neuro: MS is good with appropriate affect, pt is alert and Ox3   DM Foot Exam 02/04/2022   The skin of the feet is intact without sores or ulcerations. The pedal pulses are 2+ on right and 2+ on left. The sensation is intact to a screening 5.07, 10 gram monofilament bilaterally   DATA REVIEWED:  Lab Results  Component Value Date   HGBA1C 8.5 (A) 06/14/2022   HGBA1C 8.7 (H) 04/21/2022   HGBA1C 7.9 (A) 02/03/2022   Lab Results  Component Value Date   MICROALBUR 137.7 (H) 02/03/2022   LDLCALC 53 04/21/2022   CREATININE 1.18 (H) 05/27/2022   Lab Results  Component Value Date   MICRALBCREAT 196.2 (H) 02/03/2022    Lab Results  Component Value Date   CHOL 123 04/21/2022   HDL 42 04/21/2022   LDLCALC 53 04/21/2022   TRIG 141 04/21/2022   CHOLHDL 2.9 04/21/2022        Latest Reference Range & Units 02/03/22 10:34  MICROALB/CREAT RATIO 0.0 - 30.0 mg/g 196.2 (H)      ASSESSMENT / PLAN / RECOMMENDATIONS:   1) Type 2 Diabetes Mellitus, Poorly  controlled, With neuropathic complications, S/P Left BKA and Right great toe amputation  - Most recent A1c of 8.5 %. Goal A1c < 7.0 %.     - A1c remains above goal - She admits to dietary indiscretions with fastfood and candy since going back to work  - She has a hx of Pancreatitis , not a candidate for DPP 4 inhibitors and GLP-1 agonist - Has hx of DKA , would avoid SGLT2 inhibitors - We disucssed Omnipod5, prescription and referral for training sent    MEDICATIONS: Increase Lantus 70 units daily  Increase  Humalog 14 units TIDQAC Correction Factor:  Humalog (BG -130/25)   EDUCATION / INSTRUCTIONS: BG monitoring instructions: Patient is instructed to check her blood sugars 3 times a day, before meals . Call [facility] if: BG persistently < 70  I reviewed the Rule  of 15 for the treatment of hypoglycemia in detail with the patient. Literature supplied.   2) Diabetic complications:  Eye: Does not have known diabetic retinopathy.  Neuro/ Feet: Does  have known diabetic peripheral neuropathy. Renal: Patient does not have known baseline CKD. She is not on an ACEI/ARB at present.   3) Dyslipidemia :   - She is not on atorvastatin, patient does not like taking medication and would rather use natural route if possible -We had discussed the increased risk of cardiovascular disease as well as CVA with hyperlipidemia in the setting of diabetes     4) Microalbuminuria:   -She assures me compliance with losartan -We discussed the benefits of renal protection of ARB    Medication Continue  losartan 25 mg daily     Follow-up in 6 months  Signed electronically by: Mack Guise, MD  [facility]  [facility] [address] [place], [place] [address] Phone: [phone] FAX: [phone]   CC: [name], MD [address] [place] [address] Phone: [phone]  Fax: [phone]    Return to Endocrinology clinic as below: Future Appointments  Date Time Provider [place]  06/21/2022 10:00 AM [facility] PHARMACIST [facility] None  07/27/2022  6:10 PM [facility] DEVICE REMOTES [facility]  07/29/2022 10:00 AM Larey Dresser, MD [facility] None  09/07/2022 10:20 AM Ranell Patrick Clide Deutscher, MD [facility] CPR  10/26/2022  6:10 PM [facility] DEVICE REMOTES [facility]  12/09/2022  9:45 AM Raulkar, Clide Deutscher, MD [facility] CPR  01/25/2023  6:10 PM [facility] DEVICE REMOTES [facility]

## 2022-06-14 NOTE — Patient Instructions (Addendum)
Increase Lantus 70 units daily  Change  Humalog 14 units with each meal  Humalog correctional insulin: ADD extra units on insulin to your meal-time Humalog dose if your blood sugars are higher than 155. Use the scale below to help guide you:   Blood sugar before meal Number of units to inject  Less than 155 0 unit   156 -  180 1 units  181 -  205 2 units  206 -  230 3 units  231 -  255 4 units  256 -  280 5 units  281 -  305 6 units  306 -  330 7 units  331 -  355 8 units     HOW TO TREAT LOW BLOOD SUGARS (Blood sugar LESS THAN 70 MG/DL) Please follow the RULE OF 15 for the treatment of hypoglycemia treatment (when your (blood sugars are less than 70 mg/dL)   STEP 1: Take 15 grams of carbohydrates when your blood sugar is low, which includes:  3-4 GLUCOSE TABS  OR 3-4 OZ OF JUICE OR REGULAR SODA OR ONE TUBE OF GLUCOSE GEL    STEP 2: RECHECK blood sugar in 15 MINUTES STEP 3: If your blood sugar is still low at the 15 minute recheck --> then, go back to STEP 1 and treat AGAIN with another 15 grams of carbohydrates.

## 2022-06-15 ENCOUNTER — Encounter: Payer: Self-pay | Admitting: Internal Medicine

## 2022-06-21 ENCOUNTER — Other Ambulatory Visit: Payer: Self-pay | Admitting: Pharmacist

## 2022-06-21 DIAGNOSIS — I10 Essential (primary) hypertension: Secondary | ICD-10-CM

## 2022-06-21 NOTE — Patient Instructions (Signed)
Sarah Long reached out to the team that handles Prior Authorizations for Dr. Kelton Pillar regarding your sensors.   See about keeping a bottle of carvedilol with you in your lunchbox so you have it if you are at work! I like that you are using an alarm on your phone to help remind you.   Check your blood pressure once daily, and any time you have concerning symptoms like headache, chest pain, dizziness, shortness of breath, or vision changes.   Our goal is less than 130/80.  To appropriately check your blood pressure, make sure you do the following:  1) Avoid caffeine, exercise, or tobacco products for 30 minutes before checking. Empty your bladder. 2) Sit with your back supported in a flat-backed chair. Rest your arm on something flat (arm of the chair, table, etc). 3) Sit still with your feet flat on the floor, resting, for at least 5 minutes.  4) Check your blood pressure. Take 1-2 readings.  5) Write down these readings and bring with you to any provider appointments.  Bring your home blood pressure machine with you to a provider's office for accuracy comparison at least once a year.   Make sure you take your blood pressure medications before you come to any office visit, even if you were asked to fast for labs.  Take care! Catie Hedwig Morton, PharmD, [facility] [phone]

## 2022-06-21 NOTE — Chronic Care Management (AMB) (Signed)
Chief Complaint  Patient presents with   Hypertension    Sarah Long is a 51 y.o. year old female who presented for a telephone visit.   They were referred to the pharmacist by a quality report for assistance in managing diabetes and hypertension.   Patient is participating in a Managed Medicaid Plan:  Yes  Subjective:  Care Team: Primary Care Provider: [name], MD ; Next Scheduled Visit: needs to schedule Cardiologist: [facility] Aundra Dubin; Next Scheduled Visit: 07/29/22  Medication Access/Adherence  Current Pharmacy:  [facility], [place] - [address] [place] [address] Phone: [phone] Fax: [phone]   Patient reports affordability concerns with their medications: No  Patient reports access/transportation concerns to their pharmacy: Yes  - reports a PA is needed for DexCom sensors. Is aware she needs to set up [facility] teaching appointment.  Patient reports adherence concerns with their medications:  No    Reports she has restarted work. Received housing voucher, feeling better overall about finances. Still working on housing options outside of the hotel.    Diabetes:  Current medications: Lantus 70 units daily, Humalog 14 units up to three times daily with meals Medications tried in the past: hx diarrhea with metformin, discussed XR with Dr. Kelton Pillar and decided against it; hx pancreatitis so avoiding GLP1.  Current glucose readings: DexCom G6, however, reports needing a PA on sensors   Patient denies hypoglycemic s/sx including dizziness, shakiness, sweating. Patient denies hyperglycemic symptoms including polyuria, polydipsia, polyphagia, nocturia, neuropathy, blurred vision.  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 Has set alarm on her phone, and has decided she plans to keep a bottle of carvedilol in her lunch box so she will have it to take if she is at work or at home.   Hyperlipidemia/ASCVD Risk Reduction  Current lipid lowering medications: atorvastatin 20 mg daily   Health Maintenance  Health Maintenance Due  Topic Date Due   COLONOSCOPY (Pts 45-67yr Insurance coverage will need to be confirmed)  Never done   OPHTHALMOLOGY EXAM  06/10/2022     Objective: Lab Results  Component Value Date   HGBA1C 8.5 (A) 06/14/2022    Lab Results  Component Value Date   CREATININE 1.18 (H) 05/27/2022   BUN 24 (H) 05/27/2022   NA 137 05/27/2022   K 4.7 05/27/2022   CL 105 05/27/2022   CO2 20 (L) 05/27/2022    Lab Results  Component Value Date   CHOL 123 04/21/2022   HDL 42 04/21/2022   LDLCALC 53 04/21/2022   TRIG 141 04/21/2022   CHOLHDL 2.9 04/21/2022    Medications Reviewed Today     Reviewed by HOsker Mason RPH-CPP (Pharmacist) on 06/21/22 at 0OsykaList Status: <None>   Medication Order Taking? Sig Documenting Provider Last Dose Status Informant  acetaminophen (TYLENOL) 325 MG tablet [id number] Take 650 mg by mouth every 6 (six) hours as needed (pain). [provider] Taking Active   atorvastatin (LIPITOR) 20 MG tablet [id number] Take 1 tablet (  20 mg total) by mouth daily. Larey Dresser, MD Taking Active   Blood Pressure Monitoring (BLOOD PRESSURE MONITOR 7) DEVI [id number] No Measure blood pressure daily  Patient not taking: Reported on 06/14/2022   [name], MD Not Taking Active            Med Note Tamala Julian, DEDE   Fri May 28, 2022 11:17 AM) Has cuff but not  taking pressure daily  carvedilol (COREG) 6.25 MG tablet [id number] Yes Take 1 tablet (6.25 mg total) by mouth 2 (two) times daily with a meal. Larey Dresser, MD Taking Active   Continuous Blood Gluc Sensor (DEXCOM G6 SENSOR) MISC [id number] Yes 1 Device by Does not apply route as directed. Shamleffer, Melanie Crazier, MD Taking Active   Continuous Blood Gluc Transmit (DEXCOM G6 TRANSMITTER) MISC [id number] Yes 1 Device by Does not apply route as directed. Shamleffer, Melanie Crazier, MD Taking Active   furosemide (LASIX) 20 MG tablet [id number] Yes Take 1 tablet (20 mg total) by mouth daily. Williams, Maricela Bo, FNP Taking Active   Insulin Disposable Pump (OMNIPOD 5 G6 INTRO, GEN 5,) KIT [id number]  1 Device by Does not apply route every other day. Shamleffer, Melanie Crazier, MD  Active   Insulin Disposable Pump (OMNIPOD 5 G6 POD, GEN 5,) MISC [id number]  1 Device by Does not apply route every other day. Shamleffer, Melanie Crazier, MD  Active   insulin glargine (LANTUS SOLOSTAR) 100 UNIT/ML Solostar Pen [id number] Yes Inject 70 Units into the skin daily. Shamleffer, Melanie Crazier, MD Taking Active   insulin lispro (HUMALOG) 100 UNIT/ML KwikPen [id number] Yes max daily dose: 70 units Shamleffer, Melanie Crazier, MD Taking Active            Med Note Jodi Mourning, Toney Reil Jun 21, 2022  9:23 AM) 14 units with meals  Insulin Pen Needle 31G X 5 MM MISC [id number] Yes Use as directed in the morning, at noon, in the evening, and at bedtime. Shamleffer, Melanie Crazier, MD Taking Active   losartan (COZAAR) 25 MG tablet [id number] Yes Take 2 tablets (50 mg total) by mouth daily. Larey Dresser, MD Taking Active   Multiple Vitamins-Minerals (ADULT ONE DAILY GUMMIES PO) [id number] Yes Take 1 capsule by mouth daily. [provider] Taking Active   Discontinued 06/21/22 [id number] (Completed Course)   spironolactone (ALDACTONE) 25 MG tablet [id number] Yes Take 0.5 tablets (12.5 mg total) by mouth daily.  [place], Maricela Bo, FNP Taking Active               Assessment/Plan:   Diabetes: - Currently uncontrolled but improving - Reviewed goal A1c, goal fasting, and goal 2 hour post prandial glucose. Will collaborate with endocrinology team re need for PA and refill on DexCom sensors to [facility] to continue current regimen per Dr. Kelton Pillar, along with scheduling [facility] training appointment.    Hyperlipidemia/ASCVD Risk Reduction: - Currently controlled.  - Recommend to continue current regimen   Heart Failure: - Currently appropriately managed - Reviewed appropriate blood pressure monitoring technique and reviewed goal blood pressure - Recommend to continue current regimen, follow up with HF clinic as scheduled.    Follow Up Plan: phone call in ~ 4 weeks  Catie TJodi Mourning, PharmD, [facility] [phone]

## 2022-06-24 ENCOUNTER — Telehealth: Payer: Self-pay | Admitting: Pharmacy Technician

## 2022-06-24 ENCOUNTER — Other Ambulatory Visit (HOSPITAL_COMMUNITY): Payer: Self-pay

## 2022-06-24 NOTE — Telephone Encounter (Signed)
Patient Advocate Encounter   Received notification from Pharmacy/Office that prior authorization for Dexcom is required/requested.  PA submitted on 06/24/22 to [facility] via their portal.  Prior Authorization Request [id number] (sensors) Prior Authorization Request [id number] (transmitter) Status is pending  Pharmacy Patient Advocate Fax:  [phone]

## 2022-06-24 NOTE — Telephone Encounter (Signed)
-----   Message from Sarah Long, [place] sent at 06/21/2022 10:53 AM EDT ----- Regarding: DexCom Sensor PA Hi team,   Patient said pharmacist at [facility] noted a PA was needed for her DexCom sensors.   Thanks!  Catie Hedwig Morton, PharmD, [facility] [phone]

## 2022-06-25 NOTE — Telephone Encounter (Signed)
Patient Advocate Encounter  Prior Authorization for [employer], Sensor has been approved.   Effective dates: 06/24/2022 through 06/25/2023  Clista Bernhardt, CPhT Pharmacy Patient Advocate Specialist [facility] Patient Advocate Team Phone: [phone]   Fax: [phone]

## 2022-06-30 ENCOUNTER — Other Ambulatory Visit (HOSPITAL_COMMUNITY): Payer: Self-pay | Admitting: Emergency Medicine

## 2022-06-30 NOTE — Progress Notes (Signed)
Paramedicine Encounter    Patient ID: Sarah Long, female    DOB: 1971/03/21, 51 y.o.   MRN: [id number]   There were no vitals taken for this visit. Weight yesterday-not taken.   Last visit weight-197.6lb CBG 298lb  ATF to find Ms [place] A&O x 4, skin W&D w/ good color.  Pt has no complaints of chest pain or SOB.  Lung sounds clear throughout. No edema noted to extremities.  No obvious JVD.  She is up 13lbs since our last visit 7/5.  She says she doesn't feel the weight difference. Message sent to HF triage regarding possible med changes.  Med box reconciled.  She received her insulin pump but is waiting to so to see doctor for instructions on how to use it.  She has been having issues with getting her DexCom set up with her receiver.  I attempted to help trouble shoot but I couldn't figure it out either.  She says she's gonna call the pharmacy about the device.  Home visit complete.    Patient Care Team: [name], MD as PCP - General (Pulmonary Disease) Sueanne Margarita, MD as PCP - Cardiology (Cardiology) Constance Haw, MD as PCP - Electrophysiology (Cardiology) Osker Mason, RPH-CPP (Pharmacist) Ethelda Chick as Social Worker  Patient Active Problem List   Diagnosis Date Noted  . Peripheral vascular disease, unspecified ([place]) 03/22/2022  . Orbit fracture, left, closed, initial encounter ([place]) 03/22/2022  . Dyslipidemia 02/03/2022  . Type 2 diabetes mellitus with diabetic polyneuropathy, with long-term current use of insulin ([place]) 02/03/2022  . Type 2 diabetes mellitus with hyperglycemia, with long-term current use of insulin ([place]) 02/03/2022  . Microalbuminuria 02/03/2022  . Homelessness 11/19/2021  . Right great toe amputee ([place]) 11/03/2021  . Class 2 severe obesity due to excess calories with serious comorbidity and body mass index (BMI) of 36.0 to 36.9 in adult [facility]) 10/15/2021  . Insomnia 10/07/2021  . Localized edema 10/07/2021  . Left below-knee  amputee ([place]) 07/01/2021  . Normocytic anemia 06/26/2021  . Heart failure with mid-range ejection fraction ([place]) 06/26/2021  . Diabetic nephropathy with proteinuria ([place]) 06/26/2021  . Dental caries 04/01/2020  . Cracked tooth 04/01/2020  . Alcohol use 03/11/2020  . Vitamin D deficiency 03/11/2020  . Prolonged QT interval 02/26/2020  . Chronic combined systolic and diastolic CHF (congestive heart failure) ([place]) 02/26/2020  . Heart block AV complete ([place]) 02/11/2020  . Exocrine pancreatic insufficiency   . Dysmenorrhea 10/02/2019  . DM type 2 with diabetic peripheral neuropathy ([place]) 05/30/2019  . 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 block) 05/29/2019  . Type 2 diabetes mellitus ([place]) 10/21/2016  . Former smoker 11/26/2013  . Hypercholesteremia 02/02/2007  . Bipolar 1 disorder ([place]) 02/02/2007  . HYPERTENSION, BENIGN SYSTEMIC 02/02/2007    Current Outpatient Medications:  .  acetaminophen (TYLENOL) 325 MG tablet, Take 650 mg by mouth every 6 (six) hours as needed (pain)., Disp: , Rfl:  .  atorvastatin (LIPITOR) 20 MG tablet, Take 1 tablet (20 mg total) by mouth daily., Disp: 90 tablet, Rfl: 3 .  carvedilol (COREG) 6.25 MG tablet, Take 1 tablet (6.25 mg total) by mouth 2 (two) times daily with a meal., Disp: 180 tablet, Rfl: 3 .  Continuous Blood Gluc Sensor (DEXCOM G6 SENSOR) MISC, 1 Device by Does not apply route as directed., Disp: 9 each, Rfl: 3 .  Continuous Blood Gluc Transmit (DEXCOM G6 TRANSMITTER) MISC, 1 Device by Does not apply route  as directed., Disp: 1 each, Rfl: 3 .  furosemide (LASIX) 20 MG tablet, Take 1 tablet (20 mg total) by mouth daily., Disp: 90 tablet, Rfl: 3 .  Insulin Disposable Pump (OMNIPOD 5 G6 INTRO, GEN 5,) KIT, 1 Device by Does not apply route every other day., Disp: 1 kit, Rfl: 0 .  Insulin Disposable Pump (OMNIPOD 5 G6 POD, GEN 5,) MISC, 1 Device by Does not apply route every other day., Disp: 9 each, Rfl: 3 .  insulin glargine  (LANTUS SOLOSTAR) 100 UNIT/ML Solostar Pen, Inject 70 Units into the skin daily., Disp: 60 mL, Rfl: 3 .  insulin lispro (HUMALOG) 100 UNIT/ML KwikPen, max daily dose: 70 units, Disp: 60 mL, Rfl: 3 .  Insulin Pen Needle 31G X 5 MM MISC, Use as directed in the morning, at noon, in the evening, and at bedtime., Disp: 400 each, Rfl: 3 .  losartan (COZAAR) 25 MG tablet, Take 2 tablets (50 mg total) by mouth daily., Disp: 90 tablet, Rfl: 3 .  spironolactone (ALDACTONE) 25 MG tablet, Take 0.5 tablets (12.5 mg total) by mouth daily., Disp: 45 tablet, Rfl: 3 .  Blood Pressure Monitoring (BLOOD PRESSURE MONITOR 7) DEVI, Measure blood pressure daily (Patient not taking: Reported on 06/14/2022), Disp: 1 each, Rfl: 0 .  Multiple Vitamins-Minerals (ADULT ONE DAILY GUMMIES PO), Take 1 capsule by mouth daily. (Patient not taking: Reported on 06/30/2022), Disp: , Rfl:  Allergies  Allergen Reactions  . Strawberry Extract Hives  . Sulfa Antibiotics Hives and Itching      Social History   Socioeconomic History  . Marital status: Significant Other    Spouse name: Not on file  . Number of children: Not on file  . Years of education: Not on file  . Highest education level: Not on file  Occupational History  . Not on file  Tobacco Use  . Smoking status: Former    Packs/day: 0.50    Years: 20.00    Total pack years: 10.00    Types: Cigarettes    Quit date: 07/01/2021    Years since quitting: 0.9  . Smokeless tobacco: Never  Vaping Use  . Vaping Use: Never used  Substance and Sexual Activity  . Alcohol use: No  . Drug use: No  . Sexual activity: Yes    Birth control/protection: None  Other Topics Concern  . Not on file  Social History Narrative  . Not on file   Social Determinants of Health   Financial Resource Strain: High Risk (05/13/2022)   Overall Financial Resource Strain (CARDIA)   . Difficulty of Paying Living Expenses: Hard  Food Insecurity: Food Insecurity Present (05/13/2022)   Hunger  Vital Sign   . Worried About Charity fundraiser in the Last Year: Often true   . Ran Out of Food in the Last Year: Often true  Transportation Needs: Unmet Transportation Needs (04/01/2022)   PRAPARE - Transportation   . Lack of Transportation (Medical): Yes   . Lack of Transportation (Non-Medical): Yes  Physical Activity: Not on file  Stress: Not on file  Social Connections: Not on file  Intimate Partner Violence: Not on file    Physical Exam      Future Appointments  Date Time Provider Hooks  07/27/2022  6:10 PM [facility] DEVICE REMOTES [facility]  07/29/2022 10:00 AM Larey Dresser, MD [facility] None  08/02/2022 10:00 AM [facility] PHARMACIST [facility] None  09/07/2022 10:20 AM Raulkar, Clide Deutscher, MD [facility] CPR  10/26/2022  6:10 PM [facility] DEVICE REMOTES [facility]  12/09/2022  9:45 AM Raulkar, Clide Deutscher, MD [facility] CPR  12/15/2022  9:50 AM Shamleffer, Melanie Crazier, MD [facility] None  01/25/2023  6:10 PM [facility] DEVICE REMOTES [facility]       [name], [place] Paramedic  06/30/22

## 2022-07-01 ENCOUNTER — Telehealth (HOSPITAL_COMMUNITY): Payer: Self-pay | Admitting: *Deleted

## 2022-07-01 NOTE — Telephone Encounter (Signed)
-----   Message from Rafael Bihari, [place] sent at 06/30/2022  3:04 PM EDT ----- Regarding: RE: 13lb weight gain No med changes, needs to take meds consistently. ----- Message ----- From: Harvie Junior, CMA Sent: 06/30/2022   2:50 PM EDT To: Rafael Bihari, FNP; [facility] Triage Pool Subject: FW: 13lb weight gain                            ----- Message ----- From: [name], EMT Sent: 06/30/2022   2:30 PM EDT To: [facility] Triage Pool Subject: 13lb weight gain                                Home visit today.  I have not seen Sarah Long since 7/5.  She has been non complaint with a lot of her meds as she has been out of state visiting a friend for almost a week.  Her weight is up 13 lbs.  She has no obvious edema.  No obvious JVD.  No SOB, no chest pain. Her sugar is up CBG 298. She has not been taking any of her insulin - long or short acting.    Any med changes?

## 2022-07-01 NOTE — Telephone Encounter (Signed)
Routed to [place] paramedic.   [place], [place], FNP  Adolm Joseph Q, CMA No med changes, needs to take meds consistently.        Previous Messages    ----- Message -----  From: Harvie Junior, CMA  Sent: 06/30/2022   2:50 PM EDT  To: Rafael Bihari, FNP; [facility] Triage Pool  Subject: FW: 13lb weight gain                            ----- Message -----  From: [name], EMT  Sent: 06/30/2022   2:30 PM EDT  To: [facility] Triage Pool  Subject: 13lb weight gain                                Home visit today.  I have not seen Ms Seelye since 7/5.  She has been non complaint with a lot of her meds as she has been out of state visiting a friend for almost a week.  Her weight is up 13 lbs.  She has no obvious edema.  No obvious JVD.  No SOB, no chest pain. Her sugar is up CBG 298. She has not been taking any of her insulin - long or short acting.     Any med changes?

## 2022-07-03 ENCOUNTER — Other Ambulatory Visit: Payer: Self-pay

## 2022-07-03 ENCOUNTER — Ambulatory Visit
Admission: EM | Admit: 2022-07-03 | Discharge: 2022-07-03 | Disposition: A | Discharge status: Home / Self Care | Payer: Medicaid Other | Attending: Family Medicine | Admitting: Family Medicine

## 2022-07-03 ENCOUNTER — Emergency Department (HOSPITAL_COMMUNITY)
Admission: EM | Admit: 2022-07-03 | Discharge: 2022-07-04 | Disposition: A | Discharge status: Home / Self Care | Payer: Medicaid Other | Attending: Emergency Medicine | Admitting: Emergency Medicine

## 2022-07-03 ENCOUNTER — Emergency Department (HOSPITAL_COMMUNITY): Payer: Medicaid Other

## 2022-07-03 ENCOUNTER — Ambulatory Visit (INDEPENDENT_AMBULATORY_CARE_PROVIDER_SITE_OTHER): Payer: Medicaid Other

## 2022-07-03 DIAGNOSIS — R079 Chest pain, unspecified: Secondary | ICD-10-CM | POA: Diagnosis not present

## 2022-07-03 DIAGNOSIS — N9489 Other specified conditions associated with female genital organs and menstrual cycle: Secondary | ICD-10-CM | POA: Diagnosis not present

## 2022-07-03 DIAGNOSIS — I110 Hypertensive heart disease with heart failure: Secondary | ICD-10-CM | POA: Insufficient documentation

## 2022-07-03 DIAGNOSIS — Z79899 Other long term (current) drug therapy: Secondary | ICD-10-CM | POA: Diagnosis not present

## 2022-07-03 DIAGNOSIS — U071 COVID-19: Secondary | ICD-10-CM | POA: Insufficient documentation

## 2022-07-03 DIAGNOSIS — Z794 Long term (current) use of insulin: Secondary | ICD-10-CM | POA: Diagnosis not present

## 2022-07-03 DIAGNOSIS — R0789 Other chest pain: Secondary | ICD-10-CM | POA: Diagnosis not present

## 2022-07-03 DIAGNOSIS — B342 Coronavirus infection, unspecified: Secondary | ICD-10-CM

## 2022-07-03 DIAGNOSIS — E1365 Other specified diabetes mellitus with hyperglycemia: Secondary | ICD-10-CM | POA: Diagnosis not present

## 2022-07-03 DIAGNOSIS — I509 Heart failure, unspecified: Secondary | ICD-10-CM | POA: Diagnosis not present

## 2022-07-03 DIAGNOSIS — E119 Type 2 diabetes mellitus without complications: Secondary | ICD-10-CM | POA: Diagnosis not present

## 2022-07-03 DIAGNOSIS — R0602 Shortness of breath: Secondary | ICD-10-CM

## 2022-07-03 LAB — URINALYSIS, ROUTINE W REFLEX MICROSCOPIC
Bacteria, UA: NONE SEEN
Bilirubin Urine: NEGATIVE
Glucose, UA: NEGATIVE mg/dL
Hgb urine dipstick: NEGATIVE
Ketones, ur: NEGATIVE mg/dL
Leukocytes,Ua: NEGATIVE
Nitrite: NEGATIVE
Protein, ur: >=300 mg/dL — AB
Specific Gravity, Urine: 1.015 (ref 1.005–1.030)
pH: 5 (ref 5.0–8.0)

## 2022-07-03 LAB — D-DIMER, QUANTITATIVE: D-Dimer, Quant: 0.82 ug/mL-FEU — ABNORMAL HIGH (ref 0.00–0.50)

## 2022-07-03 LAB — CBC
HCT: 33.3 % — ABNORMAL LOW (ref 36.0–46.0)
Hemoglobin: 10.9 g/dL — ABNORMAL LOW (ref 12.0–15.0)
MCH: 29 pg (ref 26.0–34.0)
MCHC: 32.7 g/dL (ref 30.0–36.0)
MCV: 88.6 fL (ref 80.0–100.0)
Platelets: 165 10*3/uL (ref 150–400)
RBC: 3.76 MIL/uL — ABNORMAL LOW (ref 3.87–5.11)
RDW: 15.4 % (ref 11.5–15.5)
WBC: 3.3 10*3/uL — ABNORMAL LOW (ref 4.0–10.5)
nRBC: 0 % (ref 0.0–0.2)

## 2022-07-03 LAB — BASIC METABOLIC PANEL
Anion gap: 9 (ref 5–15)
BUN: 22 mg/dL — ABNORMAL HIGH (ref 6–20)
CO2: 20 mmol/L — ABNORMAL LOW (ref 22–32)
Calcium: 8.5 mg/dL — ABNORMAL LOW (ref 8.9–10.3)
Chloride: 106 mmol/L (ref 98–111)
Creatinine, Ser: 1.47 mg/dL — ABNORMAL HIGH (ref 0.44–1.00)
GFR, Estimated: 44 mL/min — ABNORMAL LOW (ref >60)
Glucose, Bld: 208 mg/dL — ABNORMAL HIGH (ref 70–99)
Potassium: 4.6 mmol/L (ref 3.5–5.1)
Sodium: 135 mmol/L (ref 135–145)

## 2022-07-03 LAB — I-STAT BETA HCG BLOOD, ED (MC, WL, AP ONLY): I-stat hCG, quantitative: <5 m[IU]/mL (ref <5)

## 2022-07-03 LAB — POCT FASTING CBG KUC MANUAL ENTRY: POCT Glucose (KUC): 210 mg/dL — AB (ref 70–99)

## 2022-07-03 LAB — BRAIN NATRIURETIC PEPTIDE: B Natriuretic Peptide: 181.4 pg/mL — ABNORMAL HIGH (ref 0.0–100.0)

## 2022-07-03 LAB — SARS CORONAVIRUS 2 BY RT PCR: SARS Coronavirus 2 by RT PCR: POSITIVE — AB

## 2022-07-03 LAB — TROPONIN I (HIGH SENSITIVITY)
Troponin I (High Sensitivity): 90 ng/L — ABNORMAL HIGH (ref <18)
Troponin I (High Sensitivity): 75 ng/L — ABNORMAL HIGH (ref <18)

## 2022-07-03 MED ORDER — IOHEXOL 350 MG/ML SOLN
80.0000 mL | Freq: Once | INTRAVENOUS | Status: AC | PRN
  Administered 2022-07-03: 80 mL via INTRAVENOUS

## 2022-07-03 MED ORDER — FUROSEMIDE 20 MG PO TABS
10.0000 mg | ORAL_TABLET | Freq: Once | ORAL | Status: AC
  Administered 2022-07-03: 10 mg via ORAL
  Filled 2022-07-03: qty 1

## 2022-07-03 NOTE — Discharge Instructions (Addendum)
You have been seen at the [facility] today for chest pain. Because of your lower than normal blood pressure and changes to your ECG in what we can the inferior area of your heart, I am sending you to the Emergency Department for further evaluation.  BP (!) 96/55   Pulse 100   Temp 98.9 F (37.2 C) (Oral)   Resp 15   SpO2 99%   Labs Reviewed  POCT FASTING CBG [facility] MANUAL ENTRY - Abnormal; Notable for the following components:      Result Value   POCT Glucose ([facility]) 210 (*)    All other components within normal limits

## 2022-07-03 NOTE — Discharge Instructions (Addendum)
Your work-up today was overall positive for coronavirus.  I will attach information to discharge papers regarding proper quarantine protocols.  If you are well to get people you live in your facility tested as well.  Continue taking your at home medicines as prescribed by your cardiologist and primary care provider.  I will set up ambulatory referral for cardiology.  It would be a good idea to try to make an appointment with him within the next 2 to 3 days for reevaluation of your symptoms.  Please do not hesitate to return to the emergency department if the worrisome signs and symptoms we discussed become apparent.

## 2022-07-03 NOTE — ED Notes (Signed)
Patient is being discharged from the Urgent Care and sent to the Emergency Department via pov . Per hagler md , patient is in need of higher level of care due to hypotension and ekg changes. Patient is aware and verbalizes understanding of plan of care.  Vitals:   07/03/22 1028  BP: (!) 96/55  Pulse: 100  Resp: 15  Temp: 98.9 F (37.2 C)  SpO2: 99%

## 2022-07-03 NOTE — ED Provider Notes (Cosign Needed Addendum)
[facility] EMERGENCY DEPARTMENT Provider Note   CSN: [id number] Arrival date & time: 07/03/22  1237     History  No chief complaint on file.   Sarah Long is a 51 y.o. female.  HPI  51 year old female presents to the emergency department with complaints of chest pain.  She has left-sided chest pain that began Tuesday night.  She has associated shortness of breath.  Chest pain has been intermittent in nature since symptom onset.  She notes no association with physical activity but notes increase in chest pain at night and while lying down.  She said she has had to use more pillows at night to prop her self up.  She also notes increasing cough over the past 2 to 3 days has been nonproductive in nature.  She denies fever, chills, night sweats, abdominal pain, nausea/vomiting/diarrhea, urinary/vaginal symptoms, change in bowel habits.  Past medical history significant for obesity, depression, diabetes mellitus, hypertension, hypercholesterolemia, heart failure  Home Medications Prior to Admission medications   Medication Sig Start Date End Date Taking? Authorizing Provider  acetaminophen (TYLENOL) 325 MG tablet Take 650 mg by mouth every 6 (six) hours as needed (pain).   Yes [provider]  atorvastatin (LIPITOR) 20 MG tablet Take 1 tablet (20 mg total) by mouth daily. 05/06/22  Yes Larey Dresser, MD  carvedilol (COREG) 6.25 MG tablet Take 1 tablet (6.25 mg total) by mouth 2 (two) times daily with a meal. 05/06/22  Yes Larey Dresser, MD  furosemide (LASIX) 20 MG tablet Take 1 tablet (20 mg total) by mouth daily. 05/19/22 05/14/23 Yes [place], Maricela Bo, FNP  insulin glargine (LANTUS SOLOSTAR) 100 UNIT/ML Solostar Pen Inject 70 Units into the skin daily. 06/14/22  Yes Shamleffer, Melanie Crazier, MD  insulin lispro (HUMALOG) 100 UNIT/ML KwikPen max daily dose: 70 units Patient taking differently: Inject 14 Units into the skin 3 (three) times daily. max daily dose:  70 units 06/14/22  Yes Shamleffer, Melanie Crazier, MD  latanoprost (XALATAN) 0.005 % ophthalmic solution Place 1 drop into both eyes at bedtime.   Yes [provider]  losartan (COZAAR) 25 MG tablet Take 2 tablets (50 mg total) by mouth daily. 05/06/22  Yes Larey Dresser, MD  spironolactone (ALDACTONE) 25 MG tablet Take 0.5 tablets (12.5 mg total) by mouth daily. 05/19/22  Yes [place], Maricela Bo, [place]  Blood Pressure Monitoring (BLOOD PRESSURE MONITOR 7) DEVI Measure blood pressure daily Patient not taking: Reported on 06/14/2022 03/31/22   [name], MD  Continuous Blood Gluc Sensor (DEXCOM G6 SENSOR) MISC 1 Device by Does not apply route as directed. 11/02/21   Shamleffer, Melanie Crazier, MD  Continuous Blood Gluc Transmit (DEXCOM G6 TRANSMITTER) MISC 1 Device by Does not apply route as directed. 11/02/21   Shamleffer, Melanie Crazier, MD  Insulin Disposable Pump (OMNIPOD 5 G6 INTRO, GEN 5,) KIT 1 Device by Does not apply route every other day. 06/14/22   Shamleffer, Melanie Crazier, MD  Insulin Disposable Pump (OMNIPOD 5 G6 POD, GEN 5,) MISC 1 Device by Does not apply route every other day. 06/14/22   Shamleffer, Melanie Crazier, MD  Insulin Pen Needle 31G X 5 MM MISC Use as directed in the morning, at noon, in the evening, and at bedtime. 11/02/21   Shamleffer, Melanie Crazier, MD      Allergies    Strawberry extract and Sulfa antibiotics    Review of Systems   Review of Systems  All other systems reviewed  and are negative.   Physical Exam Updated Vital Signs BP (!) 113/31   Pulse 80   Temp 97.8 F (36.6 C) (Axillary)   Resp (!) 25   LMP 06/16/2022 (Exact Date)   SpO2 100%  Physical Exam Vitals and nursing note reviewed.  Constitutional:      General: She is not in acute distress.    Appearance: She is well-developed. She is obese.  HENT:     Head: Normocephalic and atraumatic.  Eyes:     Conjunctiva/sclera: Conjunctivae normal.  Cardiovascular:     Rate and  Rhythm: Normal rate and regular rhythm.     Pulses: Normal pulses.     Heart sounds: No murmur heard. Pulmonary:     Effort: Pulmonary effort is normal. No respiratory distress.     Breath sounds: Normal breath sounds. No wheezing, rhonchi or rales.  Abdominal:     Palpations: Abdomen is soft.     Tenderness: There is no abdominal tenderness. There is no guarding.  Musculoskeletal:        General: No swelling.     Cervical back: Neck supple. No rigidity.     Right lower leg: No edema.     Left lower leg: No edema.     Comments: Left BKA.  Skin:    General: Skin is warm and dry.     Capillary Refill: Capillary refill takes less than 2 seconds.  Neurological:     Mental Status: She is alert.  Psychiatric:        Mood and Affect: Mood normal.     ED Results / Procedures / Treatments   Labs (all labs ordered are listed, but only abnormal results are displayed) Labs Reviewed  SARS CORONAVIRUS 2 BY RT PCR - Abnormal; Notable for the following components:      Result Value   SARS Coronavirus 2 by RT PCR POSITIVE (*)    All other components within normal limits  BASIC METABOLIC PANEL - Abnormal; Notable for the following components:   CO2 20 (*)    Glucose, Bld 208 (*)    BUN 22 (*)    Creatinine, Ser 1.47 (*)    Calcium 8.5 (*)    GFR, Estimated 44 (*)    All other components within normal limits  CBC - Abnormal; Notable for the following components:   WBC 3.3 (*)    RBC 3.76 (*)    Hemoglobin 10.9 (*)    HCT 33.3 (*)    All other components within normal limits  BRAIN NATRIURETIC PEPTIDE - Abnormal; Notable for the following components:   B Natriuretic Peptide 181.4 (*)    All other components within normal limits  URINALYSIS, ROUTINE W REFLEX MICROSCOPIC - Abnormal; Notable for the following components:   APPearance HAZY (*)    Protein, ur >=300 (*)    All other components within normal limits  D-DIMER, QUANTITATIVE - Abnormal; Notable for the following components:    D-Dimer, Quant 0.82 (*)    All other components within normal limits  TROPONIN I (HIGH SENSITIVITY) - Abnormal; Notable for the following components:   Troponin I (High Sensitivity) 90 (*)    All other components within normal limits  TROPONIN I (HIGH SENSITIVITY) - Abnormal; Notable for the following components:   Troponin I (High Sensitivity) 75 (*)    All other components within normal limits  I-STAT BETA HCG BLOOD, ED ([facility], [facility], [facility] ONLY)    EKG None  Radiology CT Angio Chest PE  W and/or Wo Contrast  Result Date: 07/03/2022 CLINICAL DATA:  Left-sided chest pain and shortness of breath for several days EXAM: CT ANGIOGRAPHY CHEST WITH CONTRAST TECHNIQUE: Multidetector CT imaging of the chest was performed using the standard protocol during bolus administration of intravenous contrast. Multiplanar CT image reconstructions and MIPs were obtained to evaluate the vascular anatomy. RADIATION DOSE REDUCTION: This exam was performed according to the departmental dose-optimization program which includes automated exposure control, adjustment of the mA and/or kV according to patient size and/or use of iterative reconstruction technique. CONTRAST:  15mL OMNIPAQUE IOHEXOL 350 MG/ML SOLN COMPARISON:  Chest x-ray from earlier in the same day. FINDINGS: Cardiovascular: Thoracic aorta demonstrates mild atherosclerotic change without aneurysmal dilatation. Heart is at the upper limits of normal in size. The pulmonary artery shows a normal branching pattern without definitive filling defect to suggest pulmonary embolism. Pacing device is noted. Mediastinum/Nodes: Thoracic inlet is within normal limits. No sizable hilar or mediastinal adenopathy is noted. The esophagus as visualized is within normal limits. Lungs/Pleura: Minimal scarring is noted in the left base. No focal infiltrate or sizable effusion is noted. No parenchymal nodules are seen. Upper Abdomen: Visualized upper abdomen shows no acute abnormality.  Musculoskeletal: No acute rib abnormality is noted. No acute bony abnormality seen. Review of the MIP images confirms the above findings. IMPRESSION: No evidence of pulmonary emboli. No acute abnormality noted. Aortic Atherosclerosis (ICD10-I70.0). Electronically Signed   By: Inez Catalina M.D.   On: 07/03/2022 22:38   DG Chest 2 View  Result Date: 07/03/2022 CLINICAL DATA:  Left chest pain, shortness of breath EXAM: CHEST - 2 VIEW COMPARISON:  Previous studies including the examination done earlier today FINDINGS: Transverse diameter of the heart is slightly increased. Pacemaker leads are noted in place. There are no new infiltrates or signs of pulmonary edema. There is no pleural effusion or pneumothorax. IMPRESSION: No active cardiopulmonary disease. Electronically Signed   By: Elmer Picker M.D.   On: 07/03/2022 13:12   DG Chest 2 View  Result Date: 07/03/2022 CLINICAL DATA:  Chest pain EXAM: CHEST - 2 VIEW COMPARISON:  06/27/2021 FINDINGS: Left-sided implanted cardiac device remains in place. The heart size and mediastinal contours are within normal limits. Both lungs are clear. The visualized skeletal structures are unremarkable. IMPRESSION: No active cardiopulmonary disease. Electronically Signed   By: Davina Poke D.O.   On: 07/03/2022 10:49    Procedures Procedures    Medications Ordered in ED Medications  furosemide (LASIX) tablet 10 mg (10 mg Oral Given 07/03/22 1845)  iohexol (OMNIPAQUE) 350 MG/ML injection 80 mL (80 mLs Intravenous Contrast Given 07/03/22 2222)    ED Course/ Medical Decision Making/ A&P Clinical Course as of 07/03/22 2316  Sat Jul 03, 2022  1906 D-dimer was positive with a value 0.82.  CT angiogram of chest for PE was ordered. [CR]    Clinical Course User Index [CR] Wilnette Kales, PA                           Medical Decision Making Amount and/or Complexity of Data Reviewed Labs: ordered. Radiology: ordered.  Risk Prescription drug  management.   This patient presents to the ED for concern of chest pain, this involves an extensive number of treatment options, and is a complaint that carries with it a high risk of complications and morbidity.  The differential diagnosis includes The emergent causes of chest pain include: Acute coronary syndrome, tamponade, pericarditis/myocarditis, aortic  dissection, pulmonary embolism, tension pneumothorax, pneumonia, and esophageal rupture. other urgent/non-acute considerations include, but are not limited to: chronic angina, aortic stenosis, cardiomyopathy, mitral valve prolapse, pulmonary hypertension, aortic insufficiency, right ventricular hypertrophy, pleuritis, bronchitis, pneumothorax, tumor, gastroesophageal reflux disease (GERD), esophageal spasm, Mallory-Weiss syndrome, peptic ulcer disease, pancreatitis, functional gastrointestinal pain, cervical or thoracic disk disease or arthritis, shoulder arthritis, costochondritis, subacromial bursitis, anxiety or panic attack, herpes zoster, breast disorders, chest wall tumors, thoracic outlet syndrome, mediastinitis.  Co morbidities that complicate the patient evaluation  Bipolar 1 disorder, diabetes mellitus, DKA, hypertension, hypercholesterolemia, tobacco smoker, 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 fascicular block, complete heart block, heart failure, peripheral vascular disease   Additional history obtained:  Additional history obtained from EMR External records from outside source obtained and reviewed including echocardiogram from 11/17/2021 indicating left ventricular ejection fraction of 70 to 75%.    Lab Tests:  I Ordered, and personally interpreted labs.  The pertinent results include: Urinalysis significant for greater than 300 protein.  Bicarb 20 which seems to be patient's baseline.  BUN of 22, creatinine 1.47, GFR 44; this is slightly decreased patient's baseline renal function.  Coronavirus positive.  WBC of 3.3,  hemoglobin of 10.9 with no thrombocytopenia.  Patient has baseline anemia and today is actually slightly improved from baseline.  BNP of 181.4.  Initial troponin was 90 with repeat troponin 75.  Patient has baseline elevation of troponin and delta negative.  D-dimer elevated 0.82 and CT angiography of chest was ordered to assess for possibility of pulmonary embolism.   Imaging Studies ordered:  I ordered imaging studies including chest x-ray, CT angio chest to rule out PE I independently visualized and interpreted imaging which showed  Chest x-ray: No active cardiopulmonary disease CT angio chest: No evidence of pulmonary embolism.  No acute abnormality. I agree with the radiologist interpretation   Cardiac Monitoring: / EKG:  The patient was maintained on a cardiac monitor.  I personally viewed and interpreted the cardiac monitored which showed an underlying rhythm of: Atrial sensed ventricularly paced rhythm which is normal in rate.  It is comparable when compared to prior ECGs in chart.   Consultations Obtained:  N/a   Problem List / ED Course / Critical interventions / Medication management  Chest pain I ordered medication including Lasix   Reevaluation of the patient after these medicines showed that the patient improved I have reviewed the patients home medicines and have made adjustments as needed   Social Determinants of Health:  Former cigarette use.  Denies illicit drug use.   Test / Admission - Considered:  Chest pain Vitals signs within normal range and stable throughout visit. Laboratory/imaging studies significant for: See above Patient's chest pain and shortness of breath was evaluated thoroughly while in the emergency department.  She had initial elevation of troponin of 90 with delta to 75.  EKG was similar in appearance to prior EKG performed on 04/21/2022.  Patient seems to have baseline elevation of troponin secondary to CKD.  Doubt ACS at this point.   Patient elevation of BNP of 181.4.  She has baseline elevation of BNP due to known CKD and is slightly decreased from normal.  She does not look volume overloaded clinically and chest x-ray shows no sign of pulmonary edema CHF seems unlikely.  D-dimer was obtained to assess for potential PE with negative CT angiography of chest for pulmonary embolism.  Patient tested positive for SARS coronavirus.  This could be because of her increasingly shortness of  breath.  Doubt pneumonia.  Doubt dissection.  Doubt tamponade, pericarditis.  Patient assessed today no new oxygen requirement. Admission to the hospital for chest pain discussed with the patient but she declined stating she "just wants to go home"; she stated capacity.  Symptomatic therapy recommended with rest, Tylenol as needed for pain, quarantine appropriately.  Continuation of at home medicines recommended.  Close follow-up with cardiology outpatient recommended for further evaluation of symptoms as soon as she is able..  Ambulatory referral placed while in the emergency department  Worrisome signs and symptoms were discussed with the patient, and the patient acknowledged understanding to return to the ED if noticed. Patient was stable upon discharge.       Final Clinical Impression(s) / ED Diagnoses Final diagnoses:  Coronavirus infection  Shortness of breath  Chest pain, unspecified type    Rx / DC Orders ED Discharge Orders     None         Wilnette Kales, [place] 07/03/22 2300    Wilnette Kales, [place] 07/03/22 2317    Cristie Hem, MD 07/04/22 0100

## 2022-07-03 NOTE — ED Notes (Signed)
Got patient into a gown on the monitor got patient a warm blanket patient is resting with call bell in reach

## 2022-07-03 NOTE — ED Triage Notes (Signed)
Pt reports L sided chest pain and sob since Tuesday. Also has sore throat and nasal congestion. Has a community paramedic that comes to her house and reports that she has gained 13 lbs in one week. Has missed multiple doses of her Lasix and Spironalactone over the last few days because she forgets to take it. Also reports R sided flank pain since Tuesday.

## 2022-07-03 NOTE — ED Triage Notes (Signed)
Patient presents to Urgent Care with complaints of congestion, cp l sided and sob  since Tuesday. Patient reports tylenol for the pain.

## 2022-07-05 NOTE — ED Provider Notes (Signed)
[place]   [id number] 07/03/22 Arrival Time: 4782  ASSESSMENT & PLAN:  1. Chest pain, unspecified type   2. SOB (shortness of breath)   3. Uncontrolled other specified diabetes mellitus with hyperglycemia ([place])    Questionable inferior T wave changes when compared to previous ECG in pt chart. Limited workup available here. Rec ED evaluation; she agrees. Stable upon d/c via private vehicle.     Discharge Instructions      You have been seen at the [facility] today for chest pain. Because of your lower than normal blood pressure and changes to your ECG in what we can the inferior area of your heart, I am sending you to the Emergency Department for further evaluation.  BP (!) 96/55   Pulse 100   Temp 98.9 F (37.2 C) (Oral)   Resp 15   SpO2 99%   Labs Reviewed  POCT FASTING CBG [facility] MANUAL ENTRY - Abnormal; Notable for the following components:      Result Value   POCT Glucose ([facility]) 210 (*)    All other components within normal limits    ECG: Performed today and interpreted by me: ventricular paced rhythm; ques inferior T wave changes vs prev ECG.  I have personally viewed the imaging studies ordered this visit. No acute changes on CXR.   Reviewed expectations re: course of current medical issues. Questions answered. Outlined signs and symptoms indicating need for more acute intervention. Patient verbalized understanding. After Visit Summary given.   SUBJECTIVE:  History from: patient.  51 year old female presents to the emergency department with complaints of chest pain.  She has left-sided chest pain that began Tuesday night.  She has associated shortness of breath.  Chest pain has been intermittent in nature since symptom onset.  She notes no association with physical activity but notes increase in chest pain at night and while lying down.  She said she has had to use more pillows at night to prop her self up.  She also notes increasing  cough over the past 2 to 3 days has been nonproductive in nature.  She denies fever, chills, night sweats, abdominal pain, nausea/vomiting/diarrhea, urinary/vaginal symptoms, change in bowel habits.   AMRIT ERCK is a 51 y.o. female who presents with complaint of non-radiating left-sided CP; abrupt onset approx 4 d ago; with assoc SOB. On/off since then. Desc as dull when present; worse when supine. Does not wake here at night but has been using more pillows recently. H/O CHF and CKD. Injury or recent strenuous activity: none. Denies: fatigue, near-syncope, palpitations, and syncope. No specific aggravating or alleviating factors reported.   Recent illnesses: none. Fever: absent. Ambulatory without assistance. Self/OTC treatment: none. Recreational drug use: denied.  Social History   Tobacco Use  Smoking Status Former   Packs/day: 0.50   Years: 20.00   Total pack years: 10.00   Types: Cigarettes   Quit date: 07/01/2021   Years since quitting: 1.0  Smokeless Tobacco Never   Social History   Substance and Sexual Activity  Alcohol Use No    OBJECTIVE:  Vitals:   07/03/22 1028  BP: (!) 96/55  Pulse: 100  Resp: 15  Temp: 98.9 F (37.2 C)  TempSrc: Oral  SpO2: 99%    General appearance: alert, oriented, no acute distress Eyes: PERRLA; EOMI; conjunctivae normal HENT: normocephalic; atraumatic Neck: supple with FROM Lungs: without labored respirations; speaks full sentences without difficulty; CTAB Heart: regular rate and rhythm without murmer Chest  Wall: without tenderness to palpation Abdomen: soft, non-tender; no guarding or rebound tenderness Extremities: without edema; without calf swelling or tenderness; symmetrical without gross deformities Skin: warm and dry; without rash or lesions Neuro: normal gait Psychological: alert and cooperative; normal mood and affect  Labs: Results for orders placed or performed during the hospital encounter of 07/03/22  POCT CBG  (manual entry)  Result Value Ref Range   POCT Glucose ([facility]) 210 (A) 70 - 99 mg/dL   Labs Reviewed  POCT FASTING CBG [facility] MANUAL ENTRY - Abnormal; Notable for the following components:      Result Value   POCT Glucose ([facility]) 210 (*)    All other components within normal limits    Imaging: DG Chest 2 View  Result Date: 07/03/2022 CLINICAL DATA:  Chest pain EXAM: CHEST - 2 VIEW COMPARISON:  06/27/2021 FINDINGS: Left-sided implanted cardiac device remains in place. The heart size and mediastinal contours are within normal limits. Both lungs are clear. The visualized skeletal structures are unremarkable. IMPRESSION: No active cardiopulmonary disease. Electronically Signed   By: Davina Poke D.O.   On: 07/03/2022 10:49     Allergies  Allergen Reactions   Strawberry Extract Hives   Sulfa Antibiotics Hives and Itching    Past Medical History:  Diagnosis Date   Abnormal Pap smear 1998   Abnormal vaginal bleeding 12/19/2011   Acute urinary retention 06/26/2021   Arthritis    Bacterial infection    Bipolar 1 disorder (HCC)    Blister of second toe of left foot 11/21/2016   Candida vaginitis 07/2007   Depression    recently added wellbutrin-has not taken yet for bipolar   Diabetes in pregnancy    Diabetes mellitus    nph 20U qam and qpm, regular with meals   Diabetic ketoacidosis without coma associated with type 2 diabetes mellitus ([place])    Fibroid    Galactorrhea of right breast 2008   GERD (gastroesophageal reflux disease)    H/O amenorrhea 07/2007   H/O dizziness 10/14/2011   H/O dysmenorrhea 2010   H/O menorrhagia 10/14/2011   H/O varicella    Headache(784.0)    Heavy vaginal bleeding due to contraceptive injection use 10/12/2011   Depo Provera   Herpes    HSV-2 infection 01/03/2009   Hx: UTI (urinary tract infection) 2009   Hypertension    on aldomet   Hypoalbuminemia due to protein-calorie malnutrition ([place]) 06/26/2021   Increased BMI 2010   Irregular uterine  bleeding 04/04/2012   Pt has mirena    Obesity 10/14/2011   Oligomenorrhea 07/2007   Osteomyelitis of great toe of right foot ([place]) 03/22/2022   Pelvic pain in female    Presence of permanent cardiac pacemaker    Preterm labor    Trichomonas    Yeast infection    Social History   Socioeconomic History   Marital status: Significant Other    Spouse name: Not on file   Number of children: Not on file   Years of education: Not on file   Highest education level: Not on file  Occupational History   Not on file  Tobacco Use   Smoking status: Former    Packs/day: 0.50    Years: 20.00    Total pack years: 10.00    Types: Cigarettes    Quit date: 07/01/2021    Years since quitting: 1.0   Smokeless tobacco: Never  Vaping Use   Vaping Use: Never used  Substance and Sexual Activity  Alcohol use: No   Drug use: No   Sexual activity: Yes    Birth control/protection: None  Other Topics Concern   Not on file  Social History Narrative   Not on file   Social Determinants of Health   Financial Resource Strain: High Risk (05/13/2022)   Overall Financial Resource Strain (CARDIA)    Difficulty of Paying Living Expenses: Hard  Food Insecurity: Food Insecurity Present (05/13/2022)   Hunger Vital Sign    Worried About Running Out of Food in the Last Year: Often true    Ran Out of Food in the Last Year: Often true  Transportation Needs: Unmet Transportation Needs (04/01/2022)   PRAPARE - Hydrologist (Medical): Yes    Lack of Transportation (Non-Medical): Yes  Physical Activity: Not on file  Stress: Not on file  Social Connections: Not on file  Intimate Partner Violence: Not on file   Family History  Problem Relation Age of Onset   Hypertension Mother    Diabetes Mother    Heart disease Mother    Hypertension Father    Diabetes Father    Heart disease Father    Stroke Maternal Grandfather    Other Neg Hx    Past Surgical History:  Procedure  Laterality Date   ABDOMINAL AORTOGRAM W/LOWER EXTREMITY N/A 06/17/2021   Procedure: ABDOMINAL AORTOGRAM W/LOWER EXTREMITY;  Surgeon: Wellington Hampshire, MD;  Location: [facility];  Service: Cardiovascular;  Laterality: N/A;   AMPUTATION Left 06/27/2021   Procedure: AMPUTATION BELOW KNEE;  Surgeon: Newt Minion, MD;  Location: [facility];  Service: Orthopedics;  Laterality: Left;   AMPUTATION Right 09/04/2021   Procedure: RIGHT GREAT TOE AMPUTATION;  Surgeon: Newt Minion, MD;  Location: [facility];  Service: Orthopedics;  Laterality: Right;   CESAREAN SECTION  1991   LEFT HEART CATH AND CORONARY ANGIOGRAPHY N/A 02/10/2020   Procedure: LEFT HEART CATH AND CORONARY ANGIOGRAPHY;  Surgeon: Troy Sine, MD;  Location: [facility];  Service: Cardiovascular;  Laterality: N/A;   PACEMAKER IMPLANT N/A 02/13/2020   Procedure: PACEMAKER IMPLANT;  Surgeon: Constance Haw, MD;  Location: [facility];  Service: Cardiovascular;  Laterality: N/A;   TEMPORARY PACEMAKER N/A 02/10/2020   Procedure: TEMPORARY PACEMAKER;  Surgeon: Troy Sine, MD;  Location: [facility];  Service: Cardiovascular;  Laterality: N/A;      Vanessa Kick, MD 07/05/22 1049

## 2022-07-06 ENCOUNTER — Other Ambulatory Visit (HOSPITAL_COMMUNITY): Payer: Self-pay | Admitting: Emergency Medicine

## 2022-07-06 NOTE — Progress Notes (Signed)
Paramedicine Encounter    Patient ID: Sarah Long, female    DOB: December 12, 1970, 51 y.o.   MRN: [id number]   LMP 06/16/2022 (Exact Date)  Weight yesterday-not taken Last visit weight-210lb  [id number]  Today Sarah Long reports she went to the hospital on Sat 7/29.  She drove herself.  While at the hospital she tested positive for COVID.  She advised me prior to my arrival that she was positive so I wore appropriate PPE, mask, gloves and eye protection.  Pt and her children all provided with masks.  Pt states she has had no chest pain since Saturday only body aches.  Pt has improved her compliance with taking her meds from last visit.  Med box reconciled for the next week.  She has been having trouble with her Dexcom 6 and has been unable to keep up with her sugar and has not been taking her insulin.  CBG is 291 today.  Provided number to [facility] tech support# [phone] Home visit complete. Patient Care Team: [name], MD as PCP - General (Pulmonary Disease) Sueanne Margarita, MD as PCP - Cardiology (Cardiology) Constance Haw, MD as PCP - Electrophysiology (Cardiology) Osker Mason, RPH-CPP (Pharmacist) Ethelda Chick as Social Worker  Patient Active Problem List   Diagnosis Date Noted  . Peripheral vascular disease, unspecified ([place]) 03/22/2022  . Orbit fracture, left, closed, initial encounter ([place]) 03/22/2022  . Dyslipidemia 02/03/2022  . Type 2 diabetes mellitus with diabetic polyneuropathy, with long-term current use of insulin ([place]) 02/03/2022  . Type 2 diabetes mellitus with hyperglycemia, with long-term current use of insulin ([place]) 02/03/2022  . Microalbuminuria 02/03/2022  . Homelessness 11/19/2021  . Right great toe amputee ([place]) 11/03/2021  . Class 2 severe obesity due to excess calories with serious comorbidity and body mass index (BMI) of 36.0 to 36.9 in adult [facility]) 10/15/2021  . Insomnia 10/07/2021  . Localized edema 10/07/2021  . Left below-knee  amputee ([place]) 07/01/2021  . Normocytic anemia 06/26/2021  . Heart failure with mid-range ejection fraction ([place]) 06/26/2021  . Diabetic nephropathy with proteinuria ([place]) 06/26/2021  . Dental caries 04/01/2020  . Cracked tooth 04/01/2020  . Alcohol use 03/11/2020  . Vitamin D deficiency 03/11/2020  . Prolonged QT interval 02/26/2020  . Chronic combined systolic and diastolic CHF (congestive heart failure) ([place]) 02/26/2020  . Heart block AV complete ([place]) 02/11/2020  . Exocrine pancreatic insufficiency   . Dysmenorrhea 10/02/2019  . DM type 2 with diabetic peripheral neuropathy ([place]) 05/30/2019  . 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 block) 05/29/2019  . Type 2 diabetes mellitus ([place]) 10/21/2016  . Former smoker 11/26/2013  . Hypercholesteremia 02/02/2007  . Bipolar 1 disorder ([place]) 02/02/2007  . HYPERTENSION, BENIGN SYSTEMIC 02/02/2007    Current Outpatient Medications:  .  acetaminophen (TYLENOL) 325 MG tablet, Take 650 mg by mouth every 6 (six) hours as needed (pain)., Disp: , Rfl:  .  atorvastatin (LIPITOR) 20 MG tablet, Take 1 tablet (20 mg total) by mouth daily., Disp: 90 tablet, Rfl: 3 .  carvedilol (COREG) 6.25 MG tablet, Take 1 tablet (6.25 mg total) by mouth 2 (two) times daily with a meal., Disp: 180 tablet, Rfl: 3 .  Continuous Blood Gluc Sensor (DEXCOM G6 SENSOR) MISC, 1 Device by Does not apply route as directed., Disp: 9 each, Rfl: 3 .  Continuous Blood Gluc Transmit (DEXCOM G6 TRANSMITTER) MISC, 1 Device by Does not apply route as directed., Disp: 1 each, Rfl: 3 .  furosemide (LASIX) 20 MG tablet, Take 1 tablet (20 mg total) by mouth daily., Disp: 90 tablet, Rfl: 3 .  losartan (COZAAR) 25 MG tablet, Take 2 tablets (50 mg total) by mouth daily., Disp: 90 tablet, Rfl: 3 .  spironolactone (ALDACTONE) 25 MG tablet, Take 0.5 tablets (12.5 mg total) by mouth daily., Disp: 45 tablet, Rfl: 3 .  Blood Pressure Monitoring (BLOOD PRESSURE MONITOR 7) DEVI,  Measure blood pressure daily (Patient not taking: Reported on 06/14/2022), Disp: 1 each, Rfl: 0 .  Insulin Disposable Pump (OMNIPOD 5 G6 INTRO, GEN 5,) KIT, 1 Device by Does not apply route every other day. (Patient not taking: Reported on 07/06/2022), Disp: 1 kit, Rfl: 0 .  Insulin Disposable Pump (OMNIPOD 5 G6 POD, GEN 5,) MISC, 1 Device by Does not apply route every other day. (Patient not taking: Reported on 07/06/2022), Disp: 9 each, Rfl: 3 .  insulin glargine (LANTUS SOLOSTAR) 100 UNIT/ML Solostar Pen, Inject 70 Units into the skin daily. (Patient not taking: Reported on 07/06/2022), Disp: 60 mL, Rfl: 3 .  insulin lispro (HUMALOG) 100 UNIT/ML KwikPen, max daily dose: 70 units (Patient not taking: Reported on 07/06/2022), Disp: 60 mL, Rfl: 3 .  Insulin Pen Needle 31G X 5 MM MISC, Use as directed in the morning, at noon, in the evening, and at bedtime. (Patient not taking: Reported on 07/06/2022), Disp: 400 each, Rfl: 3 .  latanoprost (XALATAN) 0.005 % ophthalmic solution, Place 1 drop into both eyes at bedtime. (Patient not taking: Reported on 07/06/2022), Disp: , Rfl:  Allergies  Allergen Reactions  . Strawberry Extract Hives  . Sulfa Antibiotics Hives and Itching      Social History   Socioeconomic History  . Marital status: Significant Other    Spouse name: Not on file  . Number of children: Not on file  . Years of education: Not on file  . Highest education level: Not on file  Occupational History  . Not on file  Tobacco Use  . Smoking status: Former    Packs/day: 0.50    Years: 20.00    Total pack years: 10.00    Types: Cigarettes    Quit date: 07/01/2021    Years since quitting: 1.0  . Smokeless tobacco: Never  Vaping Use  . Vaping Use: Never used  Substance and Sexual Activity  . Alcohol use: No  . Drug use: No  . Sexual activity: Yes    Birth control/protection: None  Other Topics Concern  . Not on file  Social History Narrative  . Not on file   Social Determinants of  Health   Financial Resource Strain: High Risk (05/13/2022)   Overall Financial Resource Strain (CARDIA)   . Difficulty of Paying Living Expenses: Hard  Food Insecurity: Food Insecurity Present (05/13/2022)   Hunger Vital Sign   . Worried About Charity fundraiser in the Last Year: Often true   . Ran Out of Food in the Last Year: Often true  Transportation Needs: Unmet Transportation Needs (04/01/2022)   PRAPARE - Transportation   . Lack of Transportation (Medical): Yes   . Lack of Transportation (Non-Medical): Yes  Physical Activity: Not on file  Stress: Not on file  Social Connections: Not on file  Intimate Partner Violence: Not on file    Physical Exam      Future Appointments  Date Time Provider [place]  07/27/2022  6:10 PM [facility] DEVICE REMOTES [facility]  07/29/2022 10:00 AM Larey Dresser, MD [facility]  None  08/02/2022 10:00 AM [facility] PHARMACIST [facility] None  09/07/2022 10:20 AM Raulkar, Clide Deutscher, MD [facility] CPR  10/26/2022  6:10 PM [facility] DEVICE REMOTES [facility]  12/09/2022  9:45 AM Raulkar, Clide Deutscher, MD [facility] CPR  12/15/2022  9:50 AM Shamleffer, Melanie Crazier, MD [facility] None  01/25/2023  6:10 PM [facility] DEVICE REMOTES [facility]       [name], [place] Paramedic  07/06/22

## 2022-07-12 ENCOUNTER — Ambulatory Visit: Payer: Self-pay | Admitting: *Deleted

## 2022-07-12 NOTE — Telephone Encounter (Signed)
Summary: covid protocol   Pt Was diagnosed with covid 2 Sundays ago / pt is needing clarity       Chief Complaint: covid questions Symptoms: just still sniffles Frequency: all day Pertinent Negatives: Patient denies fever Disposition: '[]'$ ED /'[]'$ Urgent Care (no appt availability in office) / '[]'$ Appointment(In office/virtual)/ '[]'$  [facility]/ '[]'$ Home Care/ '[]'$ Refused Recommended Disposition /'[]'$ [facility]/ '[x]'$  Follow-up with PCP Additional Notes: Pt had COVID, has already been out of work 10 days. (9 days since ED where COVID was dx) Called to make sure of the rules of returning to public and if testing required. Information given. Still having runny nose. Asked to continue to wear mask as long as still having runny nose and call back if cough or fever return. Has upcoming appt.   Answer Assessment - Initial Assessment Questions 1. COVID-19 ONSET: "When did the symptoms of COVID-19 first start?"     She had her 10 days out of work 2. DIAGNOSIS CONFIRMATION: "How were you diagnosed?" (e.g., COVID-19 oral or nasal viral test; COVID-19 antibody test; doctor visit)     ED 3. MAIN SYMPTOM:  "What is your main concern or symptom right now?" (e.g., breathing difficulty, cough, fatigue. loss of smell)     No treatment needed 4. SYMPTOM ONSET: "When did the    start?"     More than 0 days 5. BETTER-SAME-WORSE: "Are you getting better, staying the same, or getting worse over the last 1 to 2 weeks?"     Just still having like cold symptoms  6. RECENT MEDICAL VISIT: "Have you been seen by a healthcare provider (doctor, NP, PA) for these persisting COVID-19 symptoms?" If Yes, ask: "When were you seen?" (e.g., date)     Was in ED, now ready to go back to work 7. COUGH: "Do you have a cough?" If Yes, ask: "How bad is the cough?"       Cold symptoms like still having to blow nose 8. FEVER: "Do you have a fever?" If Yes, ask: "What is your temperature, how was it measured, and when did  it start?"     no 9. BREATHING DIFFICULTY: "Are you having any trouble breathing?" If Yes, ask: "How bad is your breathing?" (e.g., mild, moderate, severe)    - MILD: No SOB at rest, mild SOB with walking, speaks normally in sentences, can lie down, no retractions, pulse < 100.    - MODERATE: SOB at rest, SOB with minimal exertion and prefers to sit, cannot lie down flat, speaks in phrases, mild retractions, audible wheezing, pulse 100-120.    - SEVERE: Very SOB at rest, speaks in single words, struggling to breathe, sitting hunched forward, retractions, pulse > 120.       no 10. OTHER SYMPTOMS: "Do you have any other symptoms?"  (e.g., fatigue, headache, muscle pain, weakness)       no 11. HIGH RISK DISEASE: "Do you have any chronic medical problems?" (e.g., asthma, heart or lung disease, weak immune system, obesity, etc.)       yes 12. VACCINE: "Have you gotten the COVID-19 vaccine?" If Yes, ask: "Which one, how many shots, when did you get it?"       Had vaccines and boosters 13. PREGNANCY: "Is there any chance you are pregnant?" "When was your last menstrual period?"       na  Protocols used: Coronavirus (COVID-19) Persisting Symptoms Follow-up Call-A-AH

## 2022-07-14 LAB — GLUCOSE, POCT (MANUAL RESULT ENTRY): POC Glucose: 295 mg/dl — AB (ref 70–99)

## 2022-07-16 ENCOUNTER — Other Ambulatory Visit (HOSPITAL_COMMUNITY): Payer: Self-pay | Admitting: Emergency Medicine

## 2022-07-16 ENCOUNTER — Telehealth (HOSPITAL_COMMUNITY): Payer: Self-pay | Admitting: Emergency Medicine

## 2022-07-16 ENCOUNTER — Telehealth: Payer: Self-pay | Admitting: Critical Care Medicine

## 2022-07-16 NOTE — Progress Notes (Signed)
Paramedicine Encounter    Patient ID: Sarah Long, female    DOB: 1971-09-11, 51 y.o.   MRN: [id number]   BP (!) 150/90 (BP Location: Left Arm, Patient Position: Sitting, Cuff Size: Normal)   Pulse 94   Resp 16   Wt 190 lb 3.2 oz (86.3 kg)   LMP 06/16/2022 (Exact Date)   SpO2 98%   BMI 32.65 kg/m  Weight yesterday-not taken Last visit weight-210lb Cbg 376  ATF Sarah Long A&O x 4, skin warm and dry with good color.  She was sitting on the bed in her room crying.  She states she is just overwhelmed.  She is facing financial challenges to meet her rent for the month.  She is not taking her medicines and she admits same.  Her blood pressure is high and her CBG is high.  On our last visit she had called Dexcom support and she is supposed to have received it already but says she hasn't gone to the post office to see if it's been delivered yet.  I discussed the fact that not taking her medicines is a choice she is making and that it seems unreasonable not to take them considering she pays for them and I have everything set up for her. I advised her that she needed to make a concerted effort to get back on track or she could potentially end up being hospitalized.  She stated she understood and will try and do better.  I inquired about a glucometer and she said she has one she's just not sure where it is.  I suggested she look for it so she could keep up with her CBG readings and know how much insulin to take.  Med box reconciled for two weeks.  Home visit complete.  Patient Care Team: Sarah Stain, MD as PCP - General (Pulmonary Disease) Sarah Margarita, MD as PCP - Cardiology (Cardiology) Sarah Haw, MD as PCP - Electrophysiology (Cardiology) Osker Mason, RPH-CPP (Pharmacist) Ethelda Chick as Social Worker  Patient Active Problem List   Diagnosis Date Noted   Peripheral vascular disease, unspecified ([place]) 03/22/2022   Orbit fracture, left, closed, initial encounter  ([place]) 03/22/2022   Dyslipidemia 02/03/2022   Type 2 diabetes mellitus with diabetic polyneuropathy, with long-term current use of insulin ([place]) 02/03/2022   Type 2 diabetes mellitus with hyperglycemia, with long-term current use of insulin ([place]) 02/03/2022   Microalbuminuria 02/03/2022   Homelessness 11/19/2021   Right great toe amputee ([place]) 11/03/2021   Class 2 severe obesity due to excess calories with serious comorbidity and body mass index (BMI) of 36.0 to 36.9 in adult [facility]) 10/15/2021   Insomnia 10/07/2021   Localized edema 10/07/2021   Left below-knee amputee ([place]) 07/01/2021   Normocytic anemia 06/26/2021   Heart failure with mid-range ejection fraction ([place]) 06/26/2021   Diabetic nephropathy with proteinuria ([place]) 06/26/2021   Dental caries 04/01/2020   Cracked tooth 04/01/2020   Alcohol use 03/11/2020   Vitamin D deficiency 03/11/2020   Prolonged QT interval 02/26/2020   Chronic combined systolic and diastolic CHF (congestive heart failure) ([place]) 02/26/2020   Heart block AV complete ([place]) 02/11/2020   Exocrine pancreatic insufficiency    Dysmenorrhea 10/02/2019   DM type 2 with diabetic peripheral neuropathy ([place]) 05/30/2019   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 block) 05/29/2019   Type 2 diabetes mellitus ([place]) 10/21/2016   Former smoker 11/26/2013   Hypercholesteremia 02/02/2007   Bipolar 1 disorder (  [place]) 02/02/2007   HYPERTENSION, BENIGN SYSTEMIC 02/02/2007    Current Outpatient Medications:    acetaminophen (TYLENOL) 325 MG tablet, Take 650 mg by mouth every 6 (six) hours as needed (pain)., Disp: , Rfl:    atorvastatin (LIPITOR) 20 MG tablet, Take 1 tablet (20 mg total) by mouth daily., Disp: 90 tablet, Rfl: 3   carvedilol (COREG) 6.25 MG tablet, Take 1 tablet (6.25 mg total) by mouth 2 (two) times daily with a meal., Disp: 180 tablet, Rfl: 3   furosemide (LASIX) 20 MG tablet, Take 1 tablet (20 mg total) by mouth daily., Disp: 90 tablet,  Rfl: 3   losartan (COZAAR) 25 MG tablet, Take 2 tablets (50 mg total) by mouth daily., Disp: 90 tablet, Rfl: 3   spironolactone (ALDACTONE) 25 MG tablet, Take 0.5 tablets (12.5 mg total) by mouth daily., Disp: 45 tablet, Rfl: 3   Blood Pressure Monitoring (BLOOD PRESSURE MONITOR 7) DEVI, Measure blood pressure daily (Patient not taking: Reported on 06/14/2022), Disp: 1 each, Rfl: 0   Continuous Blood Gluc Sensor (DEXCOM G6 SENSOR) MISC, 1 Device by Does not apply route as directed. (Patient not taking: Reported on 07/16/2022), Disp: 9 each, Rfl: 3   Continuous Blood Gluc Transmit (DEXCOM G6 TRANSMITTER) MISC, 1 Device by Does not apply route as directed. (Patient not taking: Reported on 07/16/2022), Disp: 1 each, Rfl: 3   Insulin Disposable Pump (OMNIPOD 5 G6 INTRO, GEN 5,) KIT, 1 Device by Does not apply route every other day. (Patient not taking: Reported on 07/06/2022), Disp: 1 kit, Rfl: 0   Insulin Disposable Pump (OMNIPOD 5 G6 POD, GEN 5,) MISC, 1 Device by Does not apply route every other day. (Patient not taking: Reported on 07/06/2022), Disp: 9 each, Rfl: 3   insulin glargine (LANTUS SOLOSTAR) 100 UNIT/ML Solostar Pen, Inject 70 Units into the skin daily. (Patient not taking: Reported on 07/06/2022), Disp: 60 mL, Rfl: 3   insulin lispro (HUMALOG) 100 UNIT/ML KwikPen, max daily dose: 70 units (Patient not taking: Reported on 07/06/2022), Disp: 60 mL, Rfl: 3   Insulin Pen Needle 31G X 5 MM MISC, Use as directed in the morning, at noon, in the evening, and at bedtime. (Patient not taking: Reported on 07/06/2022), Disp: 400 each, Rfl: 3   latanoprost (XALATAN) 0.005 % ophthalmic solution, Place 1 drop into both eyes at bedtime. (Patient not taking: Reported on 07/06/2022), Disp: , Rfl:  Allergies  Allergen Reactions   Strawberry Extract Hives   Sulfa Antibiotics Hives and Itching      Social History   Socioeconomic History   Marital status: Significant Other    Spouse name: Not on file   Number of  children: Not on file   Years of education: Not on file   Highest education level: Not on file  Occupational History   Not on file  Tobacco Use   Smoking status: Former    Packs/day: 0.50    Years: 20.00    Total pack years: 10.00    Types: Cigarettes    Quit date: 07/01/2021    Years since quitting: 1.0   Smokeless tobacco: Never  Vaping Use   Vaping Use: Never used  Substance and Sexual Activity   Alcohol use: No   Drug use: No   Sexual activity: Yes    Birth control/protection: None  Other Topics Concern   Not on file  Social History Narrative   Not on file   Social Determinants of Health   Financial Resource Strain:  High Risk (05/13/2022)   Overall Financial Resource Strain (CARDIA)    Difficulty of Paying Living Expenses: Hard  Food Insecurity: Food Insecurity Present (05/13/2022)   Hunger Vital Sign    Worried About Running Out of Food in the Last Year: Often true    Ran Out of Food in the Last Year: Often true  Transportation Needs: Unmet Transportation Needs (04/01/2022)   PRAPARE - Hydrologist (Medical): Yes    Lack of Transportation (Non-Medical): Yes  Physical Activity: Not on file  Stress: Not on file  Social Connections: Not on file  Intimate Partner Violence: Not on file    Physical Exam      Future Appointments  Date Time Provider [place]  07/27/2022  6:10 PM [facility] DEVICE REMOTES [facility]  07/29/2022 10:00 AM Larey Dresser, MD [facility] None  08/02/2022 10:00 AM [facility] CCC-MM PHARMACIST [facility]N-[facility] None  08/10/2022  2:30 PM Sarah Stain, MD CHW-[facility] None  09/07/2022 10:20 AM Ranell Patrick, Clide Deutscher, MD CPR-PRMA CPR  10/26/2022  6:10 PM [facility] DEVICE REMOTES [facility]  12/09/2022  9:45 AM Raulkar, Clide Deutscher, MD CPR-PR[facility] CPR  12/15/2022  9:50 AM Shamleffer, Melanie Crazier, MD [facility] None  01/25/2023  6:10 PM [facility] DEVICE REMOTES [facility]       [name], [facility] Paramedic  07/16/22

## 2022-07-16 NOTE — Telephone Encounter (Signed)
Routing to [place] for return phone call.

## 2022-07-16 NOTE — Telephone Encounter (Signed)
Pt is calling to speak to Opal Sidles regarding housing resources. Pt had COVID and was unable to work. Pt lives in a hotel. And rent is due on Monday and she does not have the money.Pt has reached out urban [facility]. And does not get paid until next week Please advise CB- [phone]

## 2022-07-16 NOTE — Telephone Encounter (Signed)
Called to confirm today's appointment and she advised she would be unavailable because she has to work

## 2022-07-16 NOTE — Telephone Encounter (Signed)
Text and left voicemail to try and schedule an appointment for today.  Left voicemail for her to call me back.

## 2022-07-16 NOTE — Telephone Encounter (Signed)
This phone call was made August 8th - [name] cancelled appointment

## 2022-07-19 NOTE — Telephone Encounter (Signed)
Call returned to patient # [phone], message left with call back requested.

## 2022-07-21 ENCOUNTER — Emergency Department (HOSPITAL_COMMUNITY): Payer: Medicaid Other

## 2022-07-21 ENCOUNTER — Encounter (HOSPITAL_COMMUNITY): Payer: Self-pay | Admitting: Emergency Medicine

## 2022-07-21 ENCOUNTER — Ambulatory Visit: Payer: Medicaid Other | Admitting: Critical Care Medicine

## 2022-07-21 ENCOUNTER — Other Ambulatory Visit: Payer: Self-pay

## 2022-07-21 ENCOUNTER — Emergency Department (HOSPITAL_COMMUNITY)
Admission: EM | Admit: 2022-07-21 | Discharge: 2022-07-21 | Discharge status: Against Medical Advice | Payer: Medicaid Other | Attending: Student | Admitting: Student

## 2022-07-21 DIAGNOSIS — R0602 Shortness of breath: Secondary | ICD-10-CM | POA: Insufficient documentation

## 2022-07-21 DIAGNOSIS — Z5321 Procedure and treatment not carried out due to patient leaving prior to being seen by health care provider: Secondary | ICD-10-CM | POA: Insufficient documentation

## 2022-07-21 DIAGNOSIS — R059 Cough, unspecified: Secondary | ICD-10-CM | POA: Diagnosis not present

## 2022-07-21 LAB — CBC WITH DIFFERENTIAL/PLATELET
Abs Immature Granulocytes: 0.01 10*3/uL (ref 0.00–0.07)
Basophils Absolute: 0 10*3/uL (ref 0.0–0.1)
Basophils Relative: 0 %
Eosinophils Absolute: 0.2 10*3/uL (ref 0.0–0.5)
Eosinophils Relative: 2 %
HCT: 29.8 % — ABNORMAL LOW (ref 36.0–46.0)
Hemoglobin: 9.7 g/dL — ABNORMAL LOW (ref 12.0–15.0)
Immature Granulocytes: 0 %
Lymphocytes Relative: 28 %
Lymphs Abs: 1.9 10*3/uL (ref 0.7–4.0)
MCH: 29.3 pg (ref 26.0–34.0)
MCHC: 32.6 g/dL (ref 30.0–36.0)
MCV: 90 fL (ref 80.0–100.0)
Monocytes Absolute: 0.4 10*3/uL (ref 0.1–1.0)
Monocytes Relative: 7 %
Neutro Abs: 4.3 10*3/uL (ref 1.7–7.7)
Neutrophils Relative %: 63 %
Platelets: 241 10*3/uL (ref 150–400)
RBC: 3.31 MIL/uL — ABNORMAL LOW (ref 3.87–5.11)
RDW: 14.6 % (ref 11.5–15.5)
WBC: 6.8 10*3/uL (ref 4.0–10.5)
nRBC: 0 % (ref 0.0–0.2)

## 2022-07-21 LAB — BASIC METABOLIC PANEL
Anion gap: 8 (ref 5–15)
BUN: 31 mg/dL — ABNORMAL HIGH (ref 6–20)
CO2: 21 mmol/L — ABNORMAL LOW (ref 22–32)
Calcium: 8.8 mg/dL — ABNORMAL LOW (ref 8.9–10.3)
Chloride: 105 mmol/L (ref 98–111)
Creatinine, Ser: 1.48 mg/dL — ABNORMAL HIGH (ref 0.44–1.00)
GFR, Estimated: 43 mL/min — ABNORMAL LOW (ref >60)
Glucose, Bld: 310 mg/dL — ABNORMAL HIGH (ref 70–99)
Potassium: 4.4 mmol/L (ref 3.5–5.1)
Sodium: 134 mmol/L — ABNORMAL LOW (ref 135–145)

## 2022-07-21 LAB — I-STAT BETA HCG BLOOD, ED (MC, WL, AP ONLY): I-stat hCG, quantitative: <5 m[IU]/mL (ref <5)

## 2022-07-21 NOTE — ED Notes (Signed)
Pt stated she needed to go home to feed her children.

## 2022-07-21 NOTE — ED Provider Triage Note (Signed)
Emergency Medicine Provider Triage Evaluation Note  Sarah Long , a 51 y.o. female  was evaluated in triage.  Pt complains of cough x 1 week, productive with brown sputum. COVID-19 one month ago, no symptoms between that infection and current symptoms.  Review of Systems  Positive: Cough, SOB Negative: Lower extremity swelling, CP  Physical Exam  BP (!) 155/76 (BP Location: Right Arm)   Pulse 95   Temp 98.4 F (36.9 C) (Oral)   Resp 18   LMP 06/16/2022 (Exact Date)   SpO2 96%  Gen:   Awake, no distress   Resp:  Normal effort  MSK:   Moves extremities without difficulty  Other:  Coughing throughout exam, lungs CTAB, RRR no m/r/g  Medical Decision Making  Medically screening exam initiated at 3:15 AM.  Appropriate orders placed.  Sarah Long was informed that the remainder of the evaluation will be completed by another provider, this initial triage assessment does not replace that evaluation, and the importance of remaining in the ED until their evaluation is complete.  This chart was dictated using voice recognition software, Dragon. Despite the best efforts of this provider to proofread and correct errors, errors may still occur which can change documentation meaning.    Emeline Darling, PA-C 07/21/22 [phone]

## 2022-07-21 NOTE — ED Triage Notes (Signed)
Pt in with lingering cough after Covid dx, states she tested positive 1 mo ago. Pt reports cough has worsened so badly she cannot sleep. Reporting rib pain and elevated CBG's (currently 325 on her Dexcom).

## 2022-07-22 ENCOUNTER — Ambulatory Visit
Admission: RE | Admit: 2022-07-22 | Discharge: 2022-07-22 | Disposition: A | Discharge status: Home / Self Care | Payer: Medicaid Other | Source: Ambulatory Visit | Attending: Internal Medicine | Admitting: Internal Medicine

## 2022-07-22 VITALS — BP 108/61 | HR 77 | Temp 97.9°F | Resp 19

## 2022-07-22 DIAGNOSIS — J069 Acute upper respiratory infection, unspecified: Secondary | ICD-10-CM | POA: Diagnosis not present

## 2022-07-22 DIAGNOSIS — R053 Chronic cough: Secondary | ICD-10-CM | POA: Diagnosis not present

## 2022-07-22 MED ORDER — AMOXICILLIN-POT CLAVULANATE 875-125 MG PO TABS: 1.0000 | ORAL_TABLET | Freq: Two times a day (BID) | ORAL | 0 refills | Status: DC

## 2022-07-22 MED ORDER — BENZONATATE 100 MG PO CAPS: 100.0000 mg | ORAL_CAPSULE | Freq: Three times a day (TID) | ORAL | 0 refills | Status: DC | PRN

## 2022-07-22 NOTE — Discharge Instructions (Signed)
You have been prescribed a medication to alleviate symptoms.  I am not able to provide albuterol inhaler given that you have extensive heart history.  Please follow-up with PCP for further evaluation and management.

## 2022-07-22 NOTE — ED Provider Notes (Addendum)
[facility]    CSN: [id number] Arrival date & time: 07/22/22  1247      History   Chief Complaint Chief Complaint  Patient presents with   Cough    Entered by patient    HPI Sarah Long is a 51 y.o. female.   Patient presents with persistent cough and nasal congestion that has been present for about a month.  Patient reports that symptoms started when she tested positive for COVID.  She did not have a cough until she was out of quarantine.  Patient has taken multiple over-the-counter cough and cold medications with minimal improvement of symptoms.  Denies chest pain or shortness of breath.  Patient does not have a history of asthma or COPD.  Denies any known fevers.  She went to the ED yesterday but left before being seen.  Prior to discharge, a chest x-ray that was unremarkable was completed.  Blood work was also completed.   Cough   Past Medical History:  Diagnosis Date   Abnormal Pap smear 1998   Abnormal vaginal bleeding 12/19/2011   Acute urinary retention 06/26/2021   Arthritis    Bacterial infection    Bipolar 1 disorder (HCC)    Blister of second toe of left foot 11/21/2016   Candida vaginitis 07/2007   Depression    recently added wellbutrin-has not taken yet for bipolar   Diabetes in pregnancy    Diabetes mellitus    nph 20U qam and qpm, regular with meals   Diabetic ketoacidosis without coma associated with type 2 diabetes mellitus (HCC)    Fibroid    Galactorrhea of right breast 2008   GERD (gastroesophageal reflux disease)    H/O amenorrhea 07/2007   H/O dizziness 10/14/2011   H/O dysmenorrhea 2010   H/O menorrhagia 10/14/2011   H/O varicella    Headache(784.0)    Heavy vaginal bleeding due to contraceptive injection use 10/12/2011   Depo Provera   Herpes    HSV-2 infection 01/03/2009   Hx: UTI (urinary tract infection) 2009   Hypertension    on aldomet   Hypoalbuminemia due to protein-calorie malnutrition (HCC) 06/26/2021    Increased BMI 2010   Irregular uterine bleeding 04/04/2012   Pt has mirena    Obesity 10/14/2011   Oligomenorrhea 07/2007   Osteomyelitis of great toe of right foot (HCC) 03/22/2022   Pelvic pain in female    Presence of permanent cardiac pacemaker    Preterm labor    Trichomonas    Yeast infection     Patient Active Problem List   Diagnosis Date Noted   Peripheral vascular disease, unspecified (HCC) 03/22/2022   Orbit fracture, left, closed, initial encounter (HCC) 03/22/2022   Dyslipidemia 02/03/2022   Type 2 diabetes mellitus with diabetic polyneuropathy, with long-term current use of insulin (HCC) 02/03/2022   Type 2 diabetes mellitus with hyperglycemia, with long-term current use of insulin (HCC) 02/03/2022   Microalbuminuria 02/03/2022   Homelessness 11/19/2021   Right great toe amputee (HCC) 11/03/2021   Class 2 severe obesity due to excess calories with serious comorbidity and body mass index (BMI) of 36.0 to 36.9 in adult [facility]) 10/15/2021   Insomnia 10/07/2021   Localized edema 10/07/2021   Left below-knee amputee (HCC) 07/01/2021   Normocytic anemia 06/26/2021   Heart failure with mid-range ejection fraction (HCC) 06/26/2021   Diabetic nephropathy with proteinuria (HCC) 06/26/2021   Dental caries 04/01/2020   Cracked tooth 04/01/2020   Alcohol use 03/11/2020  Vitamin D deficiency 03/11/2020   Prolonged QT interval 02/26/2020   Chronic combined systolic and diastolic CHF (congestive heart failure) ([place]) 02/26/2020   Heart block AV complete ([place]) 02/11/2020   Exocrine pancreatic insufficiency    Dysmenorrhea 10/02/2019   DM type 2 with diabetic peripheral neuropathy ([place]) 05/30/2019   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 block) 05/29/2019   Type 2 diabetes mellitus ([place]) 10/21/2016   Former smoker 11/26/2013   Hypercholesteremia 02/02/2007   Bipolar 1 disorder ([place]) 02/02/2007   HYPERTENSION, BENIGN SYSTEMIC 02/02/2007    Past Surgical  History:  Procedure Laterality Date   ABDOMINAL AORTOGRAM W/LOWER EXTREMITY N/A 06/17/2021   Procedure: ABDOMINAL AORTOGRAM W/LOWER EXTREMITY;  Surgeon: Wellington Hampshire, MD;  Location: [facility];  Service: Cardiovascular;  Laterality: N/A;   AMPUTATION Left 06/27/2021   Procedure: AMPUTATION BELOW KNEE;  Surgeon: Newt Minion, MD;  Location: [facility];  Service: Orthopedics;  Laterality: Left;   AMPUTATION Right 09/04/2021   Procedure: RIGHT GREAT TOE AMPUTATION;  Surgeon: Newt Minion, MD;  Location: [place];  Service: Orthopedics;  Laterality: Right;   CESAREAN SECTION  1991   LEFT HEART CATH AND CORONARY ANGIOGRAPHY N/A 02/10/2020   Procedure: LEFT HEART CATH AND CORONARY ANGIOGRAPHY;  Surgeon: Troy Sine, MD;  Location: [facility];  Service: Cardiovascular;  Laterality: N/A;   PACEMAKER IMPLANT N/A 02/13/2020   Procedure: PACEMAKER IMPLANT;  Surgeon: Constance Haw, MD;  Location: [place];  Service: Cardiovascular;  Laterality: N/A;   TEMPORARY PACEMAKER N/A 02/10/2020   Procedure: TEMPORARY PACEMAKER;  Surgeon: Troy Sine, MD;  Location: [place] [facility];  Service: Cardiovascular;  Laterality: N/A;    OB History     Gravida  6   Para  4   Term  1   Preterm  3   AB  2   Living  4      SAB  1   IAB  1   Ectopic  0   Multiple  0   Live Births  3            Home Medications    Prior to Admission medications   Medication Sig Start Date End Date Taking? Authorizing Provider  amoxicillin-clavulanate (AUGMENTIN) 875-125 MG tablet Take 1 tablet by mouth every 12 (twelve) hours. 07/22/22  Yes , Hildred Alamin E, FNP  benzonatate (TESSALON) 100 MG capsule Take 1 capsule (100 mg total) by mouth every 8 (eight) hours as needed for cough. 07/22/22  Yes , Hildred Alamin E, FNP  acetaminophen (TYLENOL) 325 MG tablet Take 650 mg by mouth every 6 (six) hours as needed (pain).    [provider]  atorvastatin (LIPITOR) 20 MG tablet Take 1  tablet (20 mg total) by mouth daily. 05/06/22   Larey Dresser, MD  Blood Pressure Monitoring (BLOOD PRESSURE MONITOR 7) DEVI Measure blood pressure daily Patient not taking: Reported on 06/14/2022 03/31/22   [name], MD  carvedilol (COREG) 6.25 MG tablet Take 1 tablet (6.25 mg total) by mouth 2 (two) times daily with a meal. 05/06/22   Larey Dresser, MD  Continuous Blood Gluc Sensor (DEXCOM G6 SENSOR) MISC 1 Device by Does not apply route as directed. Patient not taking: Reported on 07/16/2022 11/02/21   Shamleffer, Melanie Crazier, MD  Continuous Blood Gluc Transmit (DEXCOM G6 TRANSMITTER) MISC 1 Device by Does not apply route as directed. Patient not taking: Reported on 07/16/2022 11/02/21  Shamleffer, Melanie Crazier, MD  furosemide (LASIX) 20 MG tablet Take 1 tablet (20 mg total) by mouth daily. 05/19/22 05/14/23  Rafael Bihari, FNP  Insulin Disposable Pump (OMNIPOD 5 G6 INTRO, GEN 5,) KIT 1 Device by Does not apply route every other day. Patient not taking: Reported on 07/06/2022 06/14/22   Shamleffer, Melanie Crazier, MD  Insulin Disposable Pump (OMNIPOD 5 G6 POD, GEN 5,) MISC 1 Device by Does not apply route every other day. Patient not taking: Reported on 07/06/2022 06/14/22   Shamleffer, Melanie Crazier, MD  insulin glargine (LANTUS SOLOSTAR) 100 UNIT/ML Solostar Pen Inject 70 Units into the skin daily. Patient not taking: Reported on 07/06/2022 06/14/22   Shamleffer, Melanie Crazier, MD  insulin lispro (HUMALOG) 100 UNIT/ML KwikPen max daily dose: 70 units Patient not taking: Reported on 07/06/2022 06/14/22   Shamleffer, Melanie Crazier, MD  Insulin Pen Needle 31G X 5 MM MISC Use as directed in the morning, at noon, in the evening, and at bedtime. Patient not taking: Reported on 07/06/2022 11/02/21   Shamleffer, Melanie Crazier, MD  latanoprost (XALATAN) 0.005 % ophthalmic solution Place 1 drop into both eyes at bedtime. Patient not taking: Reported on 07/06/2022    [provider]   losartan (COZAAR) 25 MG tablet Take 2 tablets (50 mg total) by mouth daily. 05/06/22   Larey Dresser, MD  spironolactone (ALDACTONE) 25 MG tablet Take 0.5 tablets (12.5 mg total) by mouth daily. 05/19/22   [place], Maricela Bo, FNP    Family History Family History  Problem Relation Age of Onset   Hypertension Mother    Diabetes Mother    Heart disease Mother    Hypertension Father    Diabetes Father    Heart disease Father    Stroke Maternal Grandfather    Other Neg Hx     Social History Social History   Tobacco Use   Smoking status: Former    Packs/day: 0.50    Years: 20.00    Total pack years: 10.00    Types: Cigarettes    Quit date: 07/01/2021    Years since quitting: 1.0   Smokeless tobacco: Never  Vaping Use   Vaping Use: Never used  Substance Use Topics   Alcohol use: No   Drug use: No     Allergies   Strawberry extract and Sulfa antibiotics   Review of Systems Review of Systems Per HPI  Physical Exam Triage Vital Signs ED Triage Vitals  Enc Vitals Group     BP 07/22/22 1314 108/61     Pulse Rate 07/22/22 1314 77     Resp 07/22/22 1314 19     Temp 07/22/22 1314 97.9 F (36.6 C)     Temp src --      SpO2 07/22/22 1314 96 %     Weight --      Height --      Head Circumference --      Peak Flow --      Pain Score 07/22/22 1313 0     Pain Loc --      Pain Edu? --      Excl. in [place]? --    No data found.  Updated Vital Signs BP 108/61   Pulse 77   Temp 97.9 F (36.6 C)   Resp 19   LMP 07/09/2022 (Exact Date)   SpO2 96%   Visual Acuity Right Eye Distance:   Left Eye Distance:   Bilateral Distance:    Right  Eye Near:   Left Eye Near:    Bilateral Near:     Physical Exam Constitutional:      General: She is not in acute distress.    Appearance: Normal appearance. She is not toxic-appearing or diaphoretic.  HENT:     Head: Normocephalic and atraumatic.     Right Ear: Tympanic membrane and ear canal normal.     Left Ear: Tympanic  membrane and ear canal normal.     Nose: Congestion present.     Mouth/Throat:     Mouth: Mucous membranes are moist.     Pharynx: No posterior oropharyngeal erythema.  Eyes:     Extraocular Movements: Extraocular movements intact.     Conjunctiva/sclera: Conjunctivae normal.     Pupils: Pupils are equal, round, and reactive to light.  Cardiovascular:     Rate and Rhythm: Normal rate and regular rhythm.     Pulses: Normal pulses.     Heart sounds: Normal heart sounds.  Pulmonary:     Effort: Pulmonary effort is normal. No respiratory distress.     Breath sounds: Normal breath sounds. No stridor. No wheezing, rhonchi or rales.  Abdominal:     General: Abdomen is flat. Bowel sounds are normal.     Palpations: Abdomen is soft.  Musculoskeletal:        General: Normal range of motion.     Cervical back: Normal range of motion.  Skin:    General: Skin is warm and dry.  Neurological:     General: No focal deficit present.     Mental Status: She is alert and oriented to person, place, and time. Mental status is at baseline.  Psychiatric:        Mood and Affect: Mood normal.        Behavior: Behavior normal.      UC Treatments / Results  Labs (all labs ordered are listed, but only abnormal results are displayed) Labs Reviewed - No data to display  EKG   Radiology DG Chest 2 View  Result Date: 07/21/2022 CLINICAL DATA:  Cough and shortness of breath for 1 week EXAM: CHEST - 2 VIEW COMPARISON:  07/03/2022 FINDINGS: Cardiac shadow is within normal limits. Pacing device is again seen. Lungs are clear. No bony abnormality is noted. IMPRESSION: No active cardiopulmonary disease. Electronically Signed   By: Inez Catalina M.D.   On: 07/21/2022 03:58    Procedures Procedures (including critical care time)  Medications Ordered in UC Medications - No data to display  Initial Impression / Assessment and Plan / UC Course  I have reviewed the triage vital signs and the nursing  notes.  Pertinent labs & imaging results that were available during my care of the patient were reviewed by me and considered in my medical decision making (see chart for details).     Chest x-ray at previous ED visit yesterday was unremarkable.  Given the patient is having persistent upper respiratory symptoms and persistent cough, will treat with Augmentin antibiotic.  I do think patient would benefit from albuterol inhaler as well but given extensive heart history will avoid albuterol to prevent QT prolongation.  Will avoid prednisone given that patient reports glucose has been elevated lately. Benzonatate prescribed to take as needed for cough as well.  Patient reports that she has appointment with PCP in a few weeks but patient was encouraged to follow-up if symptoms persist or worsen.  Patient verbalized understanding and was agreeable with plan. Final Clinical Impressions(s) /  UC Diagnoses   Final diagnoses:  Persistent cough for 3 weeks or longer  Acute upper respiratory infection     Discharge Instructions      You have been prescribed a medication to alleviate symptoms.  I am not able to provide albuterol inhaler given that you have extensive heart history.  Please follow-up with PCP for further evaluation and management.    ED Prescriptions     Medication Sig Dispense Auth. Provider   amoxicillin-clavulanate (AUGMENTIN) 875-125 MG tablet Take 1 tablet by mouth every 12 (twelve) hours. 14 tablet [place], [place] E, [place]   benzonatate (TESSALON) 100 MG capsule Take 1 capsule (100 mg total) by mouth every 8 (eight) hours as needed for cough. 21 capsule [place], Michele Rockers, [place]      PDMP not reviewed this encounter.   Teodora Medici, [place] 07/22/22 1451    Teodora Medici, [place] 07/22/22 1452

## 2022-07-22 NOTE — ED Triage Notes (Signed)
Pt is present today with c/o cough and chest congestion. Pt sx started x12 days ago

## 2022-07-27 ENCOUNTER — Ambulatory Visit (INDEPENDENT_AMBULATORY_CARE_PROVIDER_SITE_OTHER): Payer: Medicaid Other

## 2022-07-27 DIAGNOSIS — I442 Atrioventricular block, complete: Secondary | ICD-10-CM

## 2022-07-28 LAB — CUP PACEART REMOTE DEVICE CHECK
Battery Remaining Longevity: 85 mo
Battery Remaining Percentage: 76 %
Battery Voltage: 2.99 V
Brady Statistic AP VP Percent: 1 %
Brady Statistic AP VS Percent: 1 %
Brady Statistic AS VP Percent: 98 %
Brady Statistic AS VS Percent: 1 %
Brady Statistic RA Percent Paced: 1 %
Brady Statistic RV Percent Paced: 99 %
Date Time Interrogation Session: 20230822183627
Implantable Lead Implant Date: 20210310
Implantable Lead Implant Date: 20210310
Implantable Lead Location: 753859
Implantable Lead Location: 753860
Implantable Pulse Generator Implant Date: 20210310
Lead Channel Impedance Value: 400 Ohm
Lead Channel Impedance Value: 410 Ohm
Lead Channel Pacing Threshold Amplitude: 0.75 V
Lead Channel Pacing Threshold Amplitude: 0.875 V
Lead Channel Pacing Threshold Pulse Width: 0.5 ms
Lead Channel Pacing Threshold Pulse Width: 0.5 ms
Lead Channel Sensing Intrinsic Amplitude: 10.1 mV
Lead Channel Sensing Intrinsic Amplitude: 4.4 mV
Lead Channel Setting Pacing Amplitude: 1.125
Lead Channel Setting Pacing Amplitude: 1.75 V
Lead Channel Setting Pacing Pulse Width: 0.5 ms
Lead Channel Setting Sensing Sensitivity: 2 mV
Pulse Gen Model: 2272
Pulse Gen Serial Number: 3802095

## 2022-07-29 ENCOUNTER — Telehealth (HOSPITAL_COMMUNITY): Payer: Self-pay | Admitting: *Deleted

## 2022-07-29 ENCOUNTER — Ambulatory Visit (HOSPITAL_COMMUNITY)
Admission: RE | Admit: 2022-07-29 | Discharge: 2022-07-29 | Disposition: A | Discharge status: Home / Self Care | Payer: Medicaid Other | Source: Ambulatory Visit | Attending: Cardiology | Admitting: Cardiology

## 2022-07-29 ENCOUNTER — Other Ambulatory Visit (HOSPITAL_COMMUNITY): Payer: Self-pay | Admitting: Emergency Medicine

## 2022-07-29 ENCOUNTER — Telehealth: Payer: Self-pay | Admitting: Orthopedic Surgery

## 2022-07-29 VITALS — BP 140/85 | HR 80 | Wt 218.0 lb

## 2022-07-29 DIAGNOSIS — Z79899 Other long term (current) drug therapy: Secondary | ICD-10-CM | POA: Insufficient documentation

## 2022-07-29 DIAGNOSIS — I442 Atrioventricular block, complete: Secondary | ICD-10-CM | POA: Diagnosis not present

## 2022-07-29 DIAGNOSIS — E785 Hyperlipidemia, unspecified: Secondary | ICD-10-CM | POA: Diagnosis not present

## 2022-07-29 DIAGNOSIS — E119 Type 2 diabetes mellitus without complications: Secondary | ICD-10-CM | POA: Insufficient documentation

## 2022-07-29 DIAGNOSIS — F17210 Nicotine dependence, cigarettes, uncomplicated: Secondary | ICD-10-CM | POA: Diagnosis not present

## 2022-07-29 DIAGNOSIS — Z8719 Personal history of other diseases of the digestive system: Secondary | ICD-10-CM | POA: Diagnosis not present

## 2022-07-29 DIAGNOSIS — I110 Hypertensive heart disease with heart failure: Secondary | ICD-10-CM | POA: Diagnosis not present

## 2022-07-29 DIAGNOSIS — I428 Other cardiomyopathies: Secondary | ICD-10-CM | POA: Diagnosis not present

## 2022-07-29 DIAGNOSIS — Z6837 Body mass index (BMI) 37.0-37.9, adult: Secondary | ICD-10-CM | POA: Diagnosis not present

## 2022-07-29 DIAGNOSIS — I5042 Chronic combined systolic (congestive) and diastolic (congestive) heart failure: Secondary | ICD-10-CM | POA: Diagnosis not present

## 2022-07-29 DIAGNOSIS — Z794 Long term (current) use of insulin: Secondary | ICD-10-CM | POA: Diagnosis not present

## 2022-07-29 DIAGNOSIS — E669 Obesity, unspecified: Secondary | ICD-10-CM | POA: Insufficient documentation

## 2022-07-29 DIAGNOSIS — I5022 Chronic systolic (congestive) heart failure: Secondary | ICD-10-CM | POA: Diagnosis not present

## 2022-07-29 LAB — BASIC METABOLIC PANEL
Anion gap: 6 (ref 5–15)
BUN: 14 mg/dL (ref 6–20)
CO2: 21 mmol/L — ABNORMAL LOW (ref 22–32)
Calcium: 8.8 mg/dL — ABNORMAL LOW (ref 8.9–10.3)
Chloride: 111 mmol/L (ref 98–111)
Creatinine, Ser: 0.94 mg/dL (ref 0.44–1.00)
GFR, Estimated: >60 mL/min (ref >60)
Glucose, Bld: 174 mg/dL — ABNORMAL HIGH (ref 70–99)
Potassium: 4.4 mmol/L (ref 3.5–5.1)
Sodium: 138 mmol/L (ref 135–145)

## 2022-07-29 LAB — BRAIN NATRIURETIC PEPTIDE: B Natriuretic Peptide: 599.6 pg/mL — ABNORMAL HIGH (ref 0.0–100.0)

## 2022-07-29 MED ORDER — FUROSEMIDE 20 MG PO TABS: 40.0000 mg | ORAL_TABLET | Freq: Every day | ORAL | 3 refills | Status: DC

## 2022-07-29 MED ORDER — SPIRONOLACTONE 25 MG PO TABS: 25.0000 mg | ORAL_TABLET | Freq: Every day | ORAL | 3 refills | Status: DC

## 2022-07-29 NOTE — Progress Notes (Signed)
Paramedicine Encounter   Patient ID: Sarah Long , female,   DOB: 10/12/1971,50 y.o.,  MRN: [id number]   Met patient in clinic today with provider. Time spent with patient 1 hour   Met with [name] for clinic visit with Dr. McLean.  Vitals documented as taken by RN at clinic.  Pt weight up 3lbs.  Medication changes are to increase Lasix 40mg. Daily and Spironolactone 25mg daily  Discussed changes with [name] and she understands same.  Scheduled home visit for Wednesday @ 11:45.  She has an upcoming appointment with Dr. Wright and based on recent conversations with her regarding her stress levels and depression I suggested she speak with Dr. Wright about this during her upcoming visit.    , EMT-Paramedic  [phone] 07/29/2022   

## 2022-07-29 NOTE — Telephone Encounter (Signed)
Pt called requesting a script for supplies at [facility]. Pt phone number is [phone].

## 2022-07-29 NOTE — Progress Notes (Signed)
PCP: [name], MD Endocrinology: Dr. Kelton Pillar EP: Dr. Curt Bears Cardiology: Dr. Aundra Dubin  51 y.o. with history of type 2 diabetes, HTN, hyperlipidemia, complete heart block with PPM, and chronic diastolic CHF was referred by Dr. Joya Gaskins for evaluation of CHF.  Patient has had diabetes since age 18 and has had HTN for at least 5 years.  She is a smoker and has a history of prior ETOH abuse with ETOH pancreatitis. In 3/21, she was admitted with DKA.  She was found to have associated complete heart block that did not resolve, she had St Jude PPM placed.  She had an echocardiogram showing EF 40-45% with severe LV hypertrophy.  She did not have a cardiac MRI prior to PPM placement.  Cath in 3/21 showed no significant CAD (nonischemic cardiomyopathy).   Last seen in 5/21, doing well but not taking any cardiac meds. Entresto started and started work up for cardiac sarcoidosis and cardiac amyloidosis.  Echo (12/22): EF 70-75%, severe LVH, normal RV, mild to moderate MR.  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 She has a functioning orthotic.   She had follow up with EP 1/23, PPM functioning appropriately. Daily lasix restarted and cMRI arranged.  cMRI (2/23) with LVEF 68%, severe LVH, ECV 54%, no LVOT gradient, poor nulling with uninterpretable delayed enhancement.   Follow up 5/23, NYHA II symptoms, volume overloaded. Lasix increase to 40 daily and genetic testing arranged to evaluate for TTR amyloidosis and hypertrophic cardiomyopathies. 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  Of note, "heart problems" run in family with mother and maternal grandparents having CHF.   Today she returns for HF follow up. Weight up 3 lbs.  She had COVID recently and reports increased dyspnea since COVID infection.  She has 3 pillow orthopnea.  She is short of breath walking up stairs and inclines.  No chest pain.  No lightheadedness or palpitations.   ECG (personally  reviewed): NSR, v-paced (from 7/23)  Labs (4/21): K 4, creatinine 0.87 Labs (2/23): K 4.4, creatinine 1.12 Labs (5/23): K 5.2, creatinine 1.14, LDL 53, HDL 42 Labs (8/23): K 4.4, creatinine 1.48  PMH: 1. Type 2 diabetes: Diagnosed at age 4.  2. HTN 3. Hyperlipidemia 4. Active smoker 5. H/o ETOH pancreatitis 6. Bipolar disorder 7. Complete heart block: 3/21, associate with DKA episode.  Has St Jude PPM.  8. Chronic systolic CHF: Nonischemic cardiomyopathy.  Echo (3/21) with EF 40-45%, severe LVH, mild MR, normal RV.  - LHC (3/21) normal coronaries.  - PYP scan (6/21): Negative for TTR cardiac amyloidosis.  - cMRI (2/23): LVEF 68%, severe LVH, ECV 54%, no LVOT gradient, poor nulling with uninterpretable delayed enhancement.  - CTA chest (7/23): No evidence for pulmonary sarcoidosis, no ILD.  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  9. S/p left BKA (7/22). Has Phantom Limb pain.  SH: Divorced, un-employed, prior heavy ETOH now rare, smokes 5 cigarettes daily.  FH: Grandfather with CHF. No known sarcoidosis or amyloidosis.   ROS: All systems reviewed and negative except as per HPI.   Current Outpatient Medications  Medication Sig Dispense Refill   acetaminophen (TYLENOL) 325 MG tablet Take 650 mg by mouth every 6 (six) hours as needed (pain).     amoxicillin-clavulanate (AUGMENTIN) 875-125 MG tablet Take 1 tablet by mouth every 12 (twelve) hours. 14 tablet 0   atorvastatin (LIPITOR) 20 MG tablet Take 1 tablet (20 mg total)  by mouth daily. 90 tablet 3   benzonatate (TESSALON) 100 MG capsule Take 1 capsule (100 mg total) by mouth every 8 (eight) hours as needed for cough. 21 capsule 0   carvedilol (COREG) 6.25 MG tablet Take 1 tablet (6.25 mg total) by mouth 2 (two) times daily with a meal. 180 tablet 3   Continuous Blood Gluc Sensor (DEXCOM G6 SENSOR) MISC 1 Device by Does not apply route as directed. 9 each 3   Continuous Blood Gluc  Transmit (DEXCOM G6 TRANSMITTER) MISC 1 Device by Does not apply route as directed. 1 each 3   Insulin Disposable Pump (OMNIPOD 5 G6 INTRO, GEN 5,) KIT 1 Device by Does not apply route every other day. 1 kit 0   Insulin Disposable Pump (OMNIPOD 5 G6 POD, GEN 5,) MISC 1 Device by Does not apply route every other day. 9 each 3   insulin glargine (LANTUS SOLOSTAR) 100 UNIT/ML Solostar Pen Inject 70 Units into the skin daily. 60 mL 3   insulin lispro (HUMALOG) 100 UNIT/ML KwikPen max daily dose: 70 units (Patient taking differently: Inject 14 Units into the skin 3 (three) times daily. SLIDING SCALE) 60 mL 3   Insulin Pen Needle 31G X 5 MM MISC Use as directed in the morning, at noon, in the evening, and at bedtime. 400 each 3   losartan (COZAAR) 25 MG tablet Take 2 tablets (50 mg total) by mouth daily. 90 tablet 3   Blood Pressure Monitoring (BLOOD PRESSURE MONITOR 7) DEVI Measure blood pressure daily (Patient not taking: Reported on 06/14/2022) 1 each 0   furosemide (LASIX) 20 MG tablet Take 2 tablets (40 mg total) by mouth daily. 180 tablet 3   latanoprost (XALATAN) 0.005 % ophthalmic solution Place 1 drop into both eyes at bedtime. (Patient not taking: Reported on 07/29/2022)     spironolactone (ALDACTONE) 25 MG tablet Take 1 tablet (25 mg total) by mouth daily. 90 tablet 3   No current facility-administered medications for this encounter.   Wt Readings from Last 3 Encounters:  07/29/22 98.9 kg (218 lb)  07/29/22 98.9 kg (218 lb)  07/21/22 86.3 kg (190 lb 3.2 oz)   BP (!) 140/85   Pulse 80   Wt 98.9 kg (218 lb)   LMP 07/09/2022 (Exact Date)   SpO2 98%   BMI 37.42 kg/m  General: NAD Neck: JVP 8-9 cm with HJR, no thyromegaly or thyroid nodule.  Lungs: Clear to auscultation bilaterally with normal respiratory effort. CV: Nondisplaced PMI.  Heart regular S1/S2, no S3/S4, no murmur.  1+ ankle edema.  No carotid bruit.  Normal pedal pulses.  Abdomen: Soft, nontender, no hepatosplenomegaly, no  distention.  Skin: Intact without lesions or rashes.  Neurologic: Alert and oriented x 3.  Psych: Normal affect. Extremities: No clubbing or cyanosis.  HEENT: Normal.   Assessment/Plan: 1. Chronic systolic CHF: Echo in 7/61 with EF 40-45%, severe LV hypertrophy.  This was found in association with complete heart block.  Nonischemic cardiomyopathy, cath in 3/21 showed no significant CAD.  PYP scan (6/21) not suggestive of TTR amyloidosis. cMRI (2/23) showed LVEF 68%, severe LVH, ECV 54%, no LVOT gradient, poor nulling with uninterpretable delayed enhancement. CTA chest did not show evidence for pulmonary sarcoidosis.  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  There is wide phenotypic variation in female Fabry's heterozygotes, but I suspect that the cardiomyopathy with LVH and h/o complete heart block in this situation  is most likely due to Fabry's disease. She is volume overloaded on exam with NYHA class II-III symptoms.  - I will send off alpha-galactosidase activity testing.  If this confirm's Fabry's disease, she will need treatment.  - I will refer to Dr. Lattie Corns for genetic evaluation.  - Increase Lasix to 40 mg daily with BMET/BNP today and BMET in 10 days.  - Increase spironolactone to 25 mg daily.  - Continue Coreg 6.25 mg bid. - Continue losartan 50 mg daily. - Consider SGLT2 inhibitor next but need to be careful with insulin-dependent DM.  2. HTN: Increasing spironolactone as above.  3. Hyperlipidemia: Good lipids 5/23. 4. Active smoker: I strongly urged her to quit.    5. Type 2 diabetes: Followed by Endocrinology. She is on insulin. 6. Complete heart block: She has a St Jude PPM and is pacing her RV > 99% of the time.  It is possible that this could be related to Fabry's disease.  7. Obesity: Body mass index is 37.42 kg/m. - Considered referral for GLP1RA, but she has history of pancreatitis.  Followup in 1 month with  APP.   Loralie Champagne. 07/29/2022

## 2022-07-29 NOTE — Telephone Encounter (Signed)
Refill encounter ?

## 2022-07-29 NOTE — Patient Instructions (Signed)
Increase lasix to 40 mg daily.  Increase Spironolactone to 25 mg daily.  Labs done today, your results will be available in MyChart, we will contact you for abnormal readings.  Repeat blood work in 10 days   You have been referred to Dr. Nira Conn for [employer]. Her office will call you to arrange the appointment.  Your physician recommends that you schedule a follow-up appointment in: 1 month.  If you have any questions or concerns before your next appointment please send [place] a message through [place] or call our office at [phone].    TO LEAVE A MESSAGE FOR THE NURSE SELECT OPTION 2, PLEASE LEAVE A MESSAGE INCLUDING: YOUR NAME DATE OF BIRTH CALL BACK NUMBER REASON FOR CALL**this is important as we prioritize the call backs  YOU WILL RECEIVE A CALL BACK THE SAME DAY AS LONG AS YOU CALL BEFORE 4:00 PM  At the [facility], you and your health needs are our priority. As part of our continuing mission to provide you with exceptional heart care, we have created designated Provider Care Teams. These Care Teams include your primary Cardiologist (physician) and Advanced Practice Providers (APPs- Physician Assistants and Nurse Practitioners) who all work together to provide you with the care you need, when you need it.   You may see any of the following providers on your designated Care Team at your next follow up: Dr Glori Bickers Dr Haynes Kerns, NP Lyda Jester, [place] [facility], [place] Audry Riles, PharmD   Please be sure to bring in all your medications bottles to every appointment.

## 2022-07-30 NOTE — Telephone Encounter (Signed)
Pt is a left BKA can you write rx please?

## 2022-07-30 NOTE — Telephone Encounter (Signed)
Will fax today

## 2022-07-30 NOTE — Telephone Encounter (Signed)
Above you in box

## 2022-07-31 LAB — MISC LABCORP TEST (SEND OUT): Labcorp test code: 650003

## 2022-08-02 ENCOUNTER — Ambulatory Visit: Payer: Self-pay

## 2022-08-02 ENCOUNTER — Telehealth (HOSPITAL_COMMUNITY): Payer: Self-pay | Admitting: Licensed Clinical Social Worker

## 2022-08-02 ENCOUNTER — Telehealth: Payer: Self-pay | Admitting: Critical Care Medicine

## 2022-08-02 ENCOUNTER — Other Ambulatory Visit: Payer: Medicaid Other | Admitting: Pharmacist

## 2022-08-02 DIAGNOSIS — I10 Essential (primary) hypertension: Secondary | ICD-10-CM

## 2022-08-02 DIAGNOSIS — I5022 Chronic systolic (congestive) heart failure: Secondary | ICD-10-CM

## 2022-08-02 DIAGNOSIS — Z794 Long term (current) use of insulin: Secondary | ICD-10-CM

## 2022-08-02 DIAGNOSIS — I739 Peripheral vascular disease, unspecified: Secondary | ICD-10-CM

## 2022-08-02 MED ORDER — FLUCONAZOLE 150 MG PO TABS: 150.0000 mg | ORAL_TABLET | Freq: Once | ORAL | 0 refills | Status: DC

## 2022-08-02 MED ORDER — FLUCONAZOLE 150 MG PO TABS: 150.0000 mg | ORAL_TABLET | Freq: Once | ORAL | 0 refills | Status: AC

## 2022-08-02 NOTE — Telephone Encounter (Signed)
Let pt know I sent to [place]

## 2022-08-02 NOTE — Patient Instructions (Signed)
Sarah Long,   It was great talking to you today!  1) I've sent a message to Leonia Reader regarding getting you in for Omnipod training 2) I've sent a message to Tammy Sours regarding your financial needs 3) Dr. Joya Gaskins sent a prescription for fluconazole to [place].   Check your blood pressure once daily, and any time you have concerning symptoms like headache, chest pain, dizziness, shortness of breath, or vision changes.   Our goal is less than 130/80.  To appropriately check your blood pressure, make sure you do the following:  1) Avoid caffeine, exercise, or tobacco products for 30 minutes before checking. Empty your bladder. 2) Sit with your back supported in a flat-backed chair. Rest your arm on something flat (arm of the chair, table, etc). 3) Sit still with your feet flat on the floor, resting, for at least 5 minutes.  4) Check your blood pressure. Take 1-2 readings.  5) Write down these readings and bring with you to any provider appointments.  Bring your home blood pressure machine with you to a provider's office for accuracy comparison at least once a year.   Make sure you take your blood pressure medications before you come to any office visit, even if you were asked to fast for labs.  Take care!  Catie Hedwig Morton, PharmD, [facility] [phone]

## 2022-08-02 NOTE — Progress Notes (Signed)
Heart and Vascular Care Navigation  08/02/2022  Sarah Long [birth year] [id number]  Reason for Referral: Concerns with food insecurity and housing insecurity.   Engaged with patient by telephone for initial visit for Heart and Vascular Care Coordination.                                                                                                   Assessment:   CSW consulted to speak with pt about above concerns.  Pt reports that she started working and got a sign on bonus which greatly affected her food stamp amount- went from about $900 to about $60 a month.  She is trying to fix this with DHHS but in the meantime is struggling to get food for herself and her 4 children.  Children went back to school so gets school lunches now and she will speak with their counselor about the backpack program.  She is very familiar with hot meal and food pantry options in the area but struggles to get gas money to get to and from places.  She was also out of work for 2 weeks due to [place] so is struggling even more at this time.  CSW provided with $100 in gift cards to help with food and gas- pt very appreciative.  CSW also consulted about housing situation.  Lives in a motel- has been in motels/hotels for over 5 years now.  CSW has informed pt we are able to help with start up costs to get into housing when she finds a place that can accept her.                                     HRT/VAS Care Coordination     Outpatient Care Team [facility] Paramedic Name: Sarah Long- [phone]   Living arrangements for the past 2 months Hotel/Motel   Lives with: Adult Children; Minor Children   Patient Current Insurance Coverage Medicaid   Patient Has Concern With Paying Medical Bills No   Does Patient Have Prescription Coverage? Yes   Home Assistive Devices/Equipment Wheelchair; Environmental consultant (specify type); CBG Meter   DME Agency [employer]   [place] (Sarah Long)        Social History:                                                                             SDOH Screenings   Alcohol Screen: Not on file  Depression (PHQ2-9): High Risk (03/22/2022)   Depression (PHQ2-9)    PHQ-2 Score: 12  Financial Resource Strain: High Risk (08/02/2022)   Overall Financial Resource Strain (CARDIA)    Difficulty of Paying Living Expenses: Hard  Food Insecurity: Food Insecurity Present (  08/02/2022)   Hunger Vital Sign    Worried About Running Out of Food in the Last Year: Often true    Ran Out of Food in the Last Year: Often true  Housing: High Risk (08/02/2022)   Housing    Last Housing Risk Score: 2  Physical Activity: Not on file  Social Connections: Not on file  Stress: Not on file  Tobacco Use: Medium Risk (07/22/2022)   Patient History    Smoking Tobacco Use: Former    Smokeless Tobacco Use: Never    Passive Exposure: Not on file  Transportation Needs: Unmet Transportation Needs (04/01/2022)   PRAPARE - Hydrologist (Medical): Yes    Lack of Transportation (Non-Medical): Yes    SDOH Interventions: Financial Resources:  Financial Strain Interventions: Other (Comment) ([employer] gift card program) Pt working at this time.  Food Insecurity:  Food Insecurity Interventions: Other (Comment) ([employer] gift card program)  Housing Insecurity:   Living in hotel- has been there for over 5 years  Transportation:   Has a Lucianne Lei- no concerns   Follow-up plan:    Pt to reach out if she has futher needs  Sarah Long, [facility] Desk#: [phone] Cell#: [phone]

## 2022-08-02 NOTE — Progress Notes (Deleted)
Diflucan sent to [facility] pharmacy

## 2022-08-02 NOTE — Telephone Encounter (Signed)
-----   Message from Osker Mason, [place] sent at 08/02/2022 11:21 AM EDT ----- Hey Dr. Joya Gaskins,   This patient sees you next week. She recently completed a course of Augmentin and reports she has a yeast infection now - was wondering if she could get a dose of fluconazole before she sees you next week.   Thanks!  Sarah Long

## 2022-08-02 NOTE — Progress Notes (Signed)
Chief Complaint  Patient presents with   Hypertension   Medication Management   Sarah Long is a 51 y.o. year old female who presented for a telephone visit.   They were referred to the pharmacist by a quality report for assistance in managing complex medication management.    Patient is participating in a Managed Medicaid Plan:  Yes  Subjective:  Care Team: Primary Care Provider: [name], MD ; Next Scheduled Visit: 08/10/22 Heart and Vascular: Aundra Dubin; Next Visit: 08/10/22 PM&R: Serita Grit; Next Scheduled Visit: 12/09/21 Endo: Shamleffer; Next Scheduled Visit: 12/15/22  Medication Access/Adherence  Current Pharmacy:  [place], [place] - [address] [place] [address] Phone: [phone] Fax: [phone]   Patient reports affordability concerns with their medications: No  Patient reports access/transportation concerns to their pharmacy: No  Patient reports adherence concerns with their medications:  No    Does report financial barriers - was unable to work for ~ 2 weeks due to [place], having a difficult time saving enough to pursue other living options while also paying for the hotel room. Reports bed bugs. Reports issues with EBT card and food access.   Also reports that she was treated with Augmentin, now feels like she has a yeast infection. Requests script for fluconzaole.   Diabetes:  Current medications: Lantus 70 units dialy, Humalog 14 units + sliding scale; patient has been prescribed Omnipod but has not been scheduled with Omnipod trainer yet.   Heart Failure:  Current medications:  ACEi/ARB/ARNI: losartan 50 mg daily SGLT2i: none Beta blocker: carvedilol 6.25 mg twice daily Mineralocorticoid Receptor Antagonist: spironolactone 25 mg daily Diuretic regimen: furosemide 40 mg daily   Current home blood pressure readings: reports variable lately, but stress related to back to school  season   Hyperlipidemia/ASCVD Risk Reduction  Current lipid lowering medications: atorvastatin 20 mg daily   Health Maintenance  Health Maintenance Due  Topic Date Due   COLONOSCOPY (Pts 45-62yr Insurance coverage will need to be confirmed)  Never done   OPHTHALMOLOGY EXAM  06/10/2022   INFLUENZA VACCINE  07/06/2022   FOOT EXAM  07/30/2022     Objective: Lab Results  Component Value Date   HGBA1C 8.5 (A) 06/14/2022    Lab Results  Component Value Date   CREATININE 0.94 07/29/2022   BUN 14 07/29/2022   NA 138 07/29/2022   K 4.4 07/29/2022   CL 111 07/29/2022   CO2 21 (L) 07/29/2022    Lab Results  Component Value Date   CHOL 123 04/21/2022   HDL 42 04/21/2022   LDLCALC 53 04/21/2022   TRIG 141 04/21/2022   CHOLHDL 2.9 04/21/2022    Medications Reviewed Today     Reviewed by HOsker Mason RPH-CPP (Pharmacist) on 08/02/22 at 1FriedensList Status: <None>   Medication Order Taking? Sig Documenting Provider Last Dose Status Informant  acetaminophen (TYLENOL) 325 MG tablet [id number] Take 650 mg by mouth every 6 (six) hours as needed (pain). [provider]  Active Self           Med Note (Jimmey Ralph [facility]   Sat Jul 03, 2022  2:34 PM)    atorvastatin (LIPITOR) 20 MG tablet [id number] Take 1 tablet (20 mg total) by mouth daily. MLarey Dresser MD Taking Active Self  benzonatate (TESSALON) 100 MG capsule [id number] Take 1 capsule (100 mg total) by mouth every 8 (eight) hours as needed for cough.  Oswaldo Conroy E, [place] Taking Active   Blood Pressure Monitoring (BLOOD PRESSURE MONITOR 7) DEVI [id number]  Measure blood pressure daily  Patient not taking: Reported on 06/14/2022   [name], MD  Active Self           Med Note Tamala Julian, DEDE   Fri May 28, 2022 11:17 AM) Has cuff but not taking pressure daily  carvedilol (COREG) 6.25 MG tablet [id number] Yes Take 1 tablet (6.25 mg total) by mouth 2 (two) times daily with a meal. Larey Dresser, MD Taking Active Self  Continuous Blood Gluc Sensor (DEXCOM G6 SENSOR) MISC [id number] Yes 1 Device by Does not apply route as directed. Shamleffer, Melanie Crazier, MD Taking Active Self           Med Note Lucila Maine, EMER M   Thu Jul 29, 2022 10:21 AM)    Continuous Blood Gluc Transmit (DEXCOM G6 TRANSMITTER) MISC [id number] Yes 1 Device by Does not apply route as directed. Shamleffer, Melanie Crazier, MD Taking Active Self  furosemide (LASIX) 20 MG tablet [id number] Yes Take 2 tablets (40 mg total) by mouth daily. Larey Dresser, MD Taking Active   Insulin Disposable Pump (OMNIPOD 5 G6 INTRO, GEN 5,) KIT [id number] Yes 1 Device by Does not apply route every other day. Shamleffer, Melanie Crazier, MD Taking Active Self           Med Note Lucila Maine, EMER M   Thu Jul 29, 2022 10:22 AM)    Insulin Disposable Pump (OMNIPOD 5 G6 POD, GEN 5,) MISC [id number] Yes 1 Device by Does not apply route every other day. Shamleffer, Melanie Crazier, MD Taking Active Self           Med Note Lucila Maine, EMER M   Thu Jul 29, 2022 10:22 AM)    insulin glargine (LANTUS SOLOSTAR) 100 UNIT/ML Solostar Pen [id number] Yes Inject 70 Units into the skin daily. Shamleffer, Melanie Crazier, MD Taking Active Self  insulin lispro (HUMALOG) 100 UNIT/ML KwikPen [id number] Yes max daily dose: 70 units  Patient taking differently: Inject 14 Units into the skin 3 (three) times daily. SLIDING SCALE   Shamleffer, Melanie Crazier, MD Taking Active Self           Med Note Jimmey Ralph, [facility]   Sat Jul 03, 2022  2:36 PM)    Insulin Pen Needle 31G X 5 MM MISC [id number] Yes Use as directed in the morning, at noon, in the evening, and at bedtime. Shamleffer, Melanie Crazier, MD Taking Active Self  latanoprost (XALATAN) 0.005 % ophthalmic solution [id number] No Place 1 drop into both eyes at bedtime.  Patient not taking: Reported on 07/29/2022   [provider] Not Taking Active Self  losartan (COZAAR) 25 MG tablet [id number] Yes  Take 2 tablets (50 mg total) by mouth daily. Larey Dresser, MD Taking Active Self  spironolactone (ALDACTONE) 25 MG tablet [id number] Yes Take 1 tablet (25 mg total) by mouth daily. Larey Dresser, MD Taking Active               Assessment/Plan:   SDOH: - Message sent to Tammy Sours, SW regarding patient's financial barrier - Dr. Joya Gaskins has sent in fluconazole script.   Diabetes: - Currently uncontrolled - Reviewed referral to DM educator/Omnipod trainer. Appears they were waiting to hear from the patient that she received Omnipod supplies. Patient has received these. Message sent to Leonia Reader that patient is ready for scheduling/training when available   Hyperlipidemia/ASCVD Risk Reduction: -  Currently controlled.  - Continue current regimen at this time  Heart Failure: - Currently appropriately managed - Encouraged continued collaboration with HF clinic team.  - Recommend to continue current regimen at this time  Follow Up Plan: phone call in 4 weeks  Catie Hedwig Morton, PharmD, [place] [phone]

## 2022-08-03 ENCOUNTER — Telehealth (HOSPITAL_COMMUNITY): Payer: Self-pay | Admitting: Cardiology

## 2022-08-03 NOTE — Telephone Encounter (Signed)
Called patient. Let her know of the fluconazole script [place].   Catie Hedwig Morton, PharmD, [facility] [phone]

## 2022-08-03 NOTE — Telephone Encounter (Signed)
-----   Message from Larey Dresser, MD sent at 08/01/2022 10:10 PM EDT ----- This is the wrong test. The patient needed alpha galactosidase A activity testing sent, may have to be ordered as leukocyte alpha gal A activity. It is not the alpha gal IGE panel. Please have her return and get the right test ordered, make sure to run by me first please.

## 2022-08-03 NOTE — Telephone Encounter (Addendum)
Will send to local [facility]-unable to add to upcoming appt   Detailed message left for patient-will have labs that are needed 9/5 and special lab requested will need to be drawn at [facility].

## 2022-08-04 ENCOUNTER — Other Ambulatory Visit (HOSPITAL_COMMUNITY): Payer: Self-pay | Admitting: Emergency Medicine

## 2022-08-04 NOTE — Progress Notes (Addendum)
Paramedicine Encounter    Patient ID: Sarah Long, female    DOB: 1971-01-13, 51 y.o.   MRN: [id number]   LMP 07/09/2022 (Exact Date)  Weight yesterday-not taken Last visit weight-218lb Cbg 255  Home visit with [name] today finds her A&O x 4, skin W&D w/ good color.  Pt continues to live in deplorable  conditions in the hotel room she shares with her 3 children.  Sarah Long is laying everywhere.  Food containers w/ dried up foods, candies and cookie packages scattered about.   She advises she is moving at the end of the week to stay with family members somewhere on [address] in [place].  Pt reports that she has not been able to make good nutritional choices which is contributing to high blood glucose readings.  She says she is taking her long acting insulin but that is all.  She has received an insulin pump but is not yet using it as she has not been instructed on how to use it yet.  Discussed with her that she needs to reach out to her Endocrinologist for guidance/assistance getting her glucose levels under control and she advises she will. As always pt had difficulty finding her medicines.  Once she finally found them her pill box was reconciled.  Weight and vitals looked good today.  Pt. Denies chest pain or SOB.  Lung sounds clear and equal bilat.  No edema noted.  Home visit complete.    [name], [place] 08/04/2022     Patient Care Team: [name], MD as PCP - General (Pulmonary Disease) Sueanne Margarita, MD as PCP - Cardiology (Cardiology) Constance Haw, MD as PCP - Electrophysiology (Cardiology) Osker Mason, RPH-CPP (Pharmacist) Ethelda Chick as Social Worker  Patient Active Problem List   Diagnosis Date Noted   Peripheral vascular disease, unspecified ([place]) 03/22/2022   Orbit fracture, left, closed, initial encounter ([place]) 03/22/2022   Dyslipidemia 02/03/2022   Type 2 diabetes mellitus with diabetic polyneuropathy, with  long-term current use of insulin ([place]) 02/03/2022   Type 2 diabetes mellitus with hyperglycemia, with long-term current use of insulin ([place]) 02/03/2022   Microalbuminuria 02/03/2022   Homelessness 11/19/2021   Right great toe amputee ([place]) 11/03/2021   Class 2 severe obesity due to excess calories with serious comorbidity and body mass index (BMI) of 36.0 to 36.9 in adult [facility]) 10/15/2021   Insomnia 10/07/2021   Localized edema 10/07/2021   Left below-knee amputee ([place]) 07/01/2021   Normocytic anemia 06/26/2021   Heart failure with mid-range ejection fraction ([place]) 06/26/2021   Diabetic nephropathy with proteinuria ([place]) 06/26/2021   Dental caries 04/01/2020   Cracked tooth 04/01/2020   Alcohol use 03/11/2020   Vitamin D deficiency 03/11/2020   Prolonged QT interval 02/26/2020   Chronic combined systolic and diastolic CHF (congestive heart failure) ([place]) 02/26/2020   Heart block AV complete ([place]) 02/11/2020   Exocrine pancreatic insufficiency    Dysmenorrhea 10/02/2019   DM type 2 with diabetic peripheral neuropathy ([place]) 05/30/2019   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 block) 05/29/2019   Type 2 diabetes mellitus ([place]) 10/21/2016   Former smoker 11/26/2013   Hypercholesteremia 02/02/2007   Bipolar 1 disorder ([place]) 02/02/2007   HYPERTENSION, BENIGN SYSTEMIC 02/02/2007    Current Outpatient Medications:    acetaminophen (TYLENOL) 325 MG tablet, Take 650 mg by mouth every 6 (six) hours as needed (pain)., Disp: , Rfl:    atorvastatin (LIPITOR) 20 MG tablet,  Take 1 tablet (20 mg total) by mouth daily., Disp: 90 tablet, Rfl: 3   benzonatate (TESSALON) 100 MG capsule, Take 1 capsule (100 mg total) by mouth every 8 (eight) hours as needed for cough., Disp: 21 capsule, Rfl: 0   Blood Pressure Monitoring (BLOOD PRESSURE MONITOR 7) DEVI, Measure blood pressure daily (Patient not taking: Reported on 06/14/2022), Disp: 1 each, Rfl: 0   carvedilol (COREG) 6.25 MG  tablet, Take 1 tablet (6.25 mg total) by mouth 2 (two) times daily with a meal., Disp: 180 tablet, Rfl: 3   Continuous Blood Gluc Sensor (DEXCOM G6 SENSOR) MISC, 1 Device by Does not apply route as directed., Disp: 9 each, Rfl: 3   Continuous Blood Gluc Transmit (DEXCOM G6 TRANSMITTER) MISC, 1 Device by Does not apply route as directed., Disp: 1 each, Rfl: 3   furosemide (LASIX) 20 MG tablet, Take 2 tablets (40 mg total) by mouth daily., Disp: 180 tablet, Rfl: 3   Insulin Disposable Pump (OMNIPOD 5 G6 INTRO, GEN 5,) KIT, 1 Device by Does not apply route every other day., Disp: 1 kit, Rfl: 0   Insulin Disposable Pump (OMNIPOD 5 G6 POD, GEN 5,) MISC, 1 Device by Does not apply route every other day., Disp: 9 each, Rfl: 3   insulin glargine (LANTUS SOLOSTAR) 100 UNIT/ML Solostar Pen, Inject 70 Units into the skin daily., Disp: 60 mL, Rfl: 3   insulin lispro (HUMALOG) 100 UNIT/ML KwikPen, max daily dose: 70 units (Patient taking differently: Inject 14 Units into the skin 3 (three) times daily. SLIDING SCALE), Disp: 60 mL, Rfl: 3   Insulin Pen Needle 31G X 5 MM MISC, Use as directed in the morning, at noon, in the evening, and at bedtime., Disp: 400 each, Rfl: 3   latanoprost (XALATAN) 0.005 % ophthalmic solution, Place 1 drop into both eyes at bedtime. (Patient not taking: Reported on 07/29/2022), Disp: , Rfl:    losartan (COZAAR) 25 MG tablet, Take 2 tablets (50 mg total) by mouth daily., Disp: 90 tablet, Rfl: 3   spironolactone (ALDACTONE) 25 MG tablet, Take 1 tablet (25 mg total) by mouth daily., Disp: 90 tablet, Rfl: 3 Allergies  Allergen Reactions   Strawberry Extract Hives   Sulfa Antibiotics Hives and Itching      Social History   Socioeconomic History   Marital status: Significant Other    Spouse name: Not on file   Number of children: Not on file   Years of education: Not on file   Highest education level: Not on file  Occupational History   Not on file  Tobacco Use   Smoking  status: Former    Packs/day: 0.50    Years: 20.00    Total pack years: 10.00    Types: Cigarettes    Quit date: 07/01/2021    Years since quitting: 1.0   Smokeless tobacco: Never  Vaping Use   Vaping Use: Never used  Substance and Sexual Activity   Alcohol use: No   Drug use: No   Sexual activity: Yes    Birth control/protection: None  Other Topics Concern   Not on file  Social History Narrative   Not on file   Social Determinants of Health   Financial Resource Strain: High Risk (08/02/2022)   Overall Financial Resource Strain (CARDIA)    Difficulty of Paying Living Expenses: Hard  Food Insecurity: Food Insecurity Present (08/02/2022)   Hunger Vital Sign    Worried About Running Out of Food in the Last Year:  Often true    Ran Out of Food in the Last Year: Often true  Transportation Needs: Unmet Transportation Needs (04/01/2022)   PRAPARE - Hydrologist (Medical): Yes    Lack of Transportation (Non-Medical): Yes  Physical Activity: Not on file  Stress: Not on file  Social Connections: Not on file  Intimate Partner Violence: Not on file    Physical Exam      Future Appointments  Date Time Provider [place]  08/10/2022  1:45 PM [facility] LAB [facility] None  08/10/2022  2:30 PM [name], MD [facility] None  08/30/2022  9:30 AM [facility] PA/NP [facility] None  09/07/2022 10:20 AM Raulkar, Clide Deutscher, MD [facility] CPR  10/26/2022  6:10 PM [facility] DEVICE REMOTES [facility]  12/09/2022  9:45 AM Raulkar, Clide Deutscher, MD [facility] CPR  12/15/2022  9:50 AM Shamleffer, Melanie Crazier, MD [facility] None  01/25/2023  6:10 PM [facility] DEVICE REMOTES [facility]       [name], [facility] Paramedic  08/04/22

## 2022-08-05 ENCOUNTER — Telehealth (HOSPITAL_COMMUNITY): Payer: Self-pay

## 2022-08-05 NOTE — Telephone Encounter (Signed)
Patient aware and going to [facility] for blood work. Order faxed

## 2022-08-09 NOTE — Progress Notes (Signed)
Established Patient Office Visit  Subjective:  Patient ID: Sarah Long, female    DOB: 1971-08-12  Age: 51 y.o. MRN: [id number]  CC:  No chief complaint on file.   HPI 11/2021 Sarah Long presents for primary care follow-up and diabetes management  Since last seen in October the patient has continued improvement.  She has been followed closely by endocrinology and her A1c is now down to 7.4.  It had been greater than 12.  The patient also now has a Dexcom glucometer and has showing data suggesting she is staying in the 100-150 range of her blood sugars.  The patient has had a left below-knee amputation now has a functional orthotic.  Patient is followed by rehab.  Patient's phantom limb pain is being managed by the rehab physician and she states is markedly better.  She has had 1 toe amputated on the right foot.  Recently developed a sore internally in her mouth.  This patient has poor dentition and needs to be seen by dentist.  Also she is living in a hotel and may be losing her housing soon and needs assistance in this regard.  Blood pressure is good on arrival 132/87  4/17 Patient seen in return follow-up unfortunately not been taking her oral medications because she cannot afford the co-pay.  She is taking her insulin products.  On arrival blood pressure is elevated 145/102.  She does have a Dexcom meter and recently saw endocrinology.  Below is documentation from that visit.  Endo 02/2022 Today (02/03/22): Sarah Long is here for a follow up on diabetes management. She checks her  blood sugars multiple times daily through CGM.  The patient has  had hypoglycemic episodes since the last clinic visit, which typically occur rarely and after a bolus.     She has made lifestyle changes with weight loss  Has noted prosthetics    HOME DIABETES REGIMEN: Lantus 35 units daily - takes 45  Humalog 10 units TID QAC- 10-15 units  CF: Humalog (BG -130/25)      Statin: no ACE-I/ARB: no Prior  Diabetic Education: yes  Glycemic patterns summary: BG's within goal at night, high during the day    Hyperglycemic episodes postprandial    Hypoglycemic episodes occurred post bolus   Overnight periods: trends down       DIABETIC COMPLICATIONS: Microvascular complications:  Left BKA, Right  great toe amputation , neuropathy  Denies: CKD, retinopathy  Last eye exam: Completed 01/2021   Macrovascular complications:  PAD Denies: CAD, CVA   1) Type 2 Diabetes Mellitus, Sub-Optimally controlled, With neuropathic complications, S/P Left BKA and Right great toe amputation  - Most recent A1c of 7.9 %. Goal A1c < 7.0 %.       - A1c remains above goal - She unfortunately has self adjusted her insulin regimen, it appears that she has not been using the correction scale, she does admit to guessing on prandial dose has occasional hypoglycemia -We discussed the importance of following instructions so that we make the appropriate changes were necessary -In review of her CGM, I am going to increase her basal as well as prandial dose, she will be provided with a new correction scale - She has a hx of Pancreatitis , not a candidate for DPP 4 inhibitors and GLP-1 agonist - Has hx of DKA , would avoid SGLT2 inhibitors       MEDICATIONS: Increase Lantus to 50 units daily  Take Humalog 12 units TIDQAC  Correction Factor: Humalog (BG -130/25)    EDUCATION / INSTRUCTIONS: BG monitoring instructions: Patient is instructed to check her blood sugars 3 times a day, before meals . Call [facility] if: BG persistently < 70  I reviewed the Rule of 15 for the treatment of hypoglycemia in detail with the patient. Literature supplied.     2) Diabetic complications:  Eye: Does not have known diabetic retinopathy.  Neuro/ Feet: Does  have known diabetic peripheral neuropathy. Renal: Patient does not have known baseline CKD. She is not on an ACEI/ARB at present.     3) Dyslipidemia :      - She is not on atorvastatin, patient does not like taking medication and would rather use natural route if possible -We discussed the increased risk of cardiovascular disease as well as CVA with hyperlipidemia in the setting of diabetes   4) Microalbuminuria:       -She has losartan listed but she is not taking it.  We discussed the renal benefits with ARB and I have encouraged her to consider taking it   Medication Restart losartan 25 mg daily   Patient never restarted the losartan or atorvastatin because she cannot afford the $4 co-pay.  She is using insulin as prescribed.  She still living in a motel with her children but is having difficulty affording the rent.  9/5 Patient seen in return follow-up and is followed by cardiology closely for her cardiomyopathy nonischemic.  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  She is undergoing genetic testing for this.  She does have a pacemaker in place.  Cardiac medicines have been adjusted by cardiology.  Patient is also followed by endocrinology and she is on 70 units of Lantus and a sliding scale Humalog average dose of 14 units before meals.  She is trying to get set up for the insulin pump but is yet to achieve this.  She was seen by ophthalmology in April of this year and found to not have diabetic eye disease.  She is due a colon cancer screening and agrees to receive the Cologuard kit.  She needs dental care follow-up.  Also needs mental health follow-up.  She cannot take Latuda because of her cardiac disease and had itching with Lamictal.  She is due a flu vaccine and agrees to receive this is also due foot exam at this visit Past Medical History:  Diagnosis Date   Abnormal Pap smear 1998   Abnormal vaginal bleeding 12/19/2011   Acute urinary retention 06/26/2021   Arthritis    Bacterial infection    Bipolar 1 disorder (HCC)    Blister of second toe of left foot 11/21/2016   Candida vaginitis 07/2007   Depression     recently added wellbutrin-has not taken yet for bipolar   Diabetes in pregnancy    Diabetes mellitus    nph 20U qam and qpm, regular with meals   Diabetic ketoacidosis without coma associated with type 2 diabetes mellitus (HCC)    Fibroid    Galactorrhea of right breast 2008   GERD (gastroesophageal reflux disease)    H/O amenorrhea 07/2007   H/O dizziness 10/14/2011   H/O dysmenorrhea 2010   H/O menorrhagia 10/14/2011   H/O varicella    Headache(784.0)    Heavy vaginal bleeding due to contraceptive injection use 10/12/2011   Depo Provera   Herpes    HSV-2 infection 01/03/2009   Hx: UTI (urinary tract infection) 2009   Hypertension  on aldomet   Hypoalbuminemia due to protein-calorie malnutrition ([place]) 06/26/2021   Increased BMI 2010   Irregular uterine bleeding 04/04/2012   Pt has mirena    Obesity 10/14/2011   Oligomenorrhea 07/2007   Osteomyelitis of great toe of right foot ([place]) 03/22/2022   Pelvic pain in female    Presence of permanent cardiac pacemaker    Preterm labor    Trichomonas    Yeast infection     Past Surgical History:  Procedure Laterality Date   ABDOMINAL AORTOGRAM W/LOWER EXTREMITY N/A 06/17/2021   Procedure: ABDOMINAL AORTOGRAM W/LOWER EXTREMITY;  Surgeon: Wellington Hampshire, MD;  Location: [facility];  Service: Cardiovascular;  Laterality: N/A;   AMPUTATION Left 06/27/2021   Procedure: AMPUTATION BELOW KNEE;  Surgeon: Newt Minion, MD;  Location: [facility];  Service: Orthopedics;  Laterality: Left;   AMPUTATION Right 09/04/2021   Procedure: RIGHT GREAT TOE AMPUTATION;  Surgeon: Newt Minion, MD;  Location: [facility];  Service: Orthopedics;  Laterality: Right;   CESAREAN SECTION  1991   LEFT HEART CATH AND CORONARY ANGIOGRAPHY N/A 02/10/2020   Procedure: LEFT HEART CATH AND CORONARY ANGIOGRAPHY;  Surgeon: Troy Sine, MD;  Location: [facility];  Service: Cardiovascular;  Laterality: N/A;   PACEMAKER IMPLANT N/A 02/13/2020   Procedure:  PACEMAKER IMPLANT;  Surgeon: Constance Haw, MD;  Location: [facility];  Service: Cardiovascular;  Laterality: N/A;   TEMPORARY PACEMAKER N/A 02/10/2020   Procedure: TEMPORARY PACEMAKER;  Surgeon: Troy Sine, MD;  Location: [facility];  Service: Cardiovascular;  Laterality: N/A;    Family History  Problem Relation Age of Onset   Hypertension Mother    Diabetes Mother    Heart disease Mother    Hypertension Father    Diabetes Father    Heart disease Father    Stroke Maternal Grandfather    Other Neg Hx     Social History   Socioeconomic History   Marital status: Significant Other    Spouse name: Not on file   Number of children: Not on file   Years of education: Not on file   Highest education level: Not on file  Occupational History   Not on file  Tobacco Use   Smoking status: Former    Packs/day: 0.50    Years: 20.00    Total pack years: 10.00    Types: Cigarettes    Quit date: 07/01/2021    Years since quitting: 1.1   Smokeless tobacco: Never  Vaping Use   Vaping Use: Never used  Substance and Sexual Activity   Alcohol use: No   Drug use: No   Sexual activity: Yes    Birth control/protection: None  Other Topics Concern   Not on file  Social History Narrative   Not on file   Social Determinants of Health   Financial Resource Strain: High Risk (08/02/2022)   Overall Financial Resource Strain (CARDIA)    Difficulty of Paying Living Expenses: Hard  Food Insecurity: Food Insecurity Present (08/02/2022)   Hunger Vital Sign    Worried About Running Out of Food in the Last Year: Often true    Ran Out of Food in the Last Year: Often true  Transportation Needs: Unmet Transportation Needs (04/01/2022)   PRAPARE - Hydrologist (Medical): Yes    Lack of Transportation (Non-Medical): Yes  Physical Activity: Not on file  Stress: Not on file  Social Connections: Not  on file  Intimate Partner Violence: Not on file     Outpatient Medications Prior to Visit  Medication Sig Dispense Refill   acetaminophen (TYLENOL) 325 MG tablet Take 650 mg by mouth every 6 (six) hours as needed (pain).     atorvastatin (LIPITOR) 20 MG tablet Take 1 tablet (20 mg total) by mouth daily. 90 tablet 3   Blood Pressure Monitoring (BLOOD PRESSURE MONITOR 7) DEVI Measure blood pressure daily 1 each 0   carvedilol (COREG) 6.25 MG tablet Take 1 tablet (6.25 mg total) by mouth 2 (two) times daily with a meal. 180 tablet 3   Continuous Blood Gluc Sensor (DEXCOM G6 SENSOR) MISC 1 Device by Does not apply route as directed. 9 each 3   Continuous Blood Gluc Transmit (DEXCOM G6 TRANSMITTER) MISC 1 Device by Does not apply route as directed. 1 each 3   furosemide (LASIX) 20 MG tablet Take 2 tablets (40 mg total) by mouth daily. 180 tablet 3   Insulin Disposable Pump (OMNIPOD 5 G6 INTRO, GEN 5,) KIT 1 Device by Does not apply route every other day. 1 kit 0   Insulin Disposable Pump (OMNIPOD 5 G6 POD, GEN 5,) MISC 1 Device by Does not apply route every other day. 9 each 3   insulin glargine (LANTUS SOLOSTAR) 100 UNIT/ML Solostar Pen Inject 70 Units into the skin daily. 60 mL 3   insulin lispro (HUMALOG) 100 UNIT/ML KwikPen max daily dose: 70 units 60 mL 3   Insulin Pen Needle 31G X 5 MM MISC Use as directed in the morning, at noon, in the evening, and at bedtime. 400 each 3   latanoprost (XALATAN) 0.005 % ophthalmic solution Place 1 drop into both eyes at bedtime.     losartan (COZAAR) 25 MG tablet Take 2 tablets (50 mg total) by mouth daily. 90 tablet 3   spironolactone (ALDACTONE) 25 MG tablet Take 1 tablet (25 mg total) by mouth daily. 90 tablet 3   benzonatate (TESSALON) 100 MG capsule Take 1 capsule (100 mg total) by mouth every 8 (eight) hours as needed for cough. 21 capsule 0   No facility-administered medications prior to visit.    Allergies  Allergen Reactions   Lamictal [Lamotrigine] Itching   Strawberry Extract Hives   Sulfa  Antibiotics Hives and Itching    ROS Review of Systems  Constitutional:  Negative for chills, diaphoresis and fever.  HENT:  Negative for congestion, hearing loss, nosebleeds, sore throat and tinnitus.        Mouth ulcer inside of right cheek mucosa  Eyes:  Negative for photophobia and redness.  Respiratory:  Negative for cough, shortness of breath, wheezing and stridor.   Cardiovascular:  Negative for chest pain, palpitations and leg swelling.  Gastrointestinal:  Negative for abdominal pain, blood in stool, constipation, diarrhea, nausea and vomiting.  Endocrine: Negative for polydipsia.  Genitourinary:  Negative for dysuria, flank pain, frequency, hematuria and urgency.  Musculoskeletal:  Negative for back pain, myalgias and neck pain.  Skin:  Negative for rash.  Allergic/Immunologic: Negative for environmental allergies.  Neurological:  Negative for dizziness, tremors, seizures, weakness and headaches.  Hematological:  Does not bruise/bleed easily.  Psychiatric/Behavioral:  Negative for suicidal ideas. The patient is not nervous/anxious.       Objective:    Physical Exam Vitals reviewed.  Constitutional:      Appearance: Normal appearance. She is well-developed. She is obese. She is not diaphoretic.  HENT:     Head: Normocephalic and atraumatic.  Nose: No nasal deformity, septal deviation, mucosal edema or rhinorrhea.     Right Sinus: No maxillary sinus tenderness or frontal sinus tenderness.     Left Sinus: No maxillary sinus tenderness or frontal sinus tenderness.     Mouth/Throat:     Mouth: Mucous membranes are moist.     Pharynx: No oropharyngeal exudate.     Comments: There is a slight ulceration inside the right cheek mucosa and there is a fractured molar which likely has caused the injury Eyes:     General: No scleral icterus.    Conjunctiva/sclera: Conjunctivae normal.     Pupils: Pupils are equal, round, and reactive to light.  Neck:     Thyroid: No  thyromegaly.     Vascular: No carotid bruit or JVD.     Trachea: Trachea normal. No tracheal tenderness or tracheal deviation.  Cardiovascular:     Rate and Rhythm: Normal rate and regular rhythm.     Chest Wall: PMI is not displaced.     Pulses: Normal pulses. No decreased pulses.     Heart sounds: Normal heart sounds, S1 normal and S2 normal. Heart sounds not distant. No murmur heard.    No systolic murmur is present.     No diastolic murmur is present.     No friction rub. No gallop. No S3 or S4 sounds.  Pulmonary:     Effort: No tachypnea, accessory muscle usage or respiratory distress.     Breath sounds: No stridor. No decreased breath sounds, wheezing, rhonchi or rales.  Chest:     Chest wall: No tenderness.  Abdominal:     General: Bowel sounds are normal. There is no distension.     Palpations: Abdomen is soft. Abdomen is not rigid.     Tenderness: There is no abdominal tenderness. There is no guarding or rebound.  Musculoskeletal:        General: Normal range of motion.     Cervical back: Normal range of motion and neck supple. No edema, erythema or rigidity. No muscular tenderness. Normal range of motion.     Comments: Orthotic on left below-knee amputation left leg functioning well Foot exam shows amputated great toe on the right otherwise normal  Lymphadenopathy:     Head:     Right side of head: No submental or submandibular adenopathy.     Left side of head: No submental or submandibular adenopathy.     Cervical: No cervical adenopathy.  Skin:    General: Skin is warm and dry.     Coloration: Skin is not pale.     Findings: No rash.     Nails: There is no clubbing.  Neurological:     Mental Status: She is alert and oriented to person, place, and time.     Sensory: No sensory deficit.  Psychiatric:        Mood and Affect: Mood normal.        Speech: Speech normal.        Behavior: Behavior normal.        Thought Content: Thought content normal.        Judgment:  Judgment normal.     BP 132/80   Pulse 89   Ht $R'5\' 4"'bY$  (1.626 m)   Wt 209 lb 3.2 oz (94.9 kg)   LMP 07/09/2022 (Exact Date)   SpO2 100%   BMI 35.91 kg/m  Wt Readings from Last 3 Encounters:  08/10/22 209 lb 3.2 oz (94.9 kg)  08/04/22  206 lb 6.4 oz (93.6 kg)  07/29/22 218 lb (98.9 kg)     Health Maintenance Due  Topic Date Due   Fecal DNA (Cologuard)  Never done     There are no preventive care reminders to display for this patient.  Lab Results  Component Value Date   TSH 4.396 02/11/2020   Lab Results  Component Value Date   WBC 6.8 07/21/2022   HGB 9.7 (L) 07/21/2022   HCT 29.8 (L) 07/21/2022   MCV 90.0 07/21/2022   PLT 241 07/21/2022   Lab Results  Component Value Date   NA 138 07/29/2022   K 4.4 07/29/2022   CO2 21 (L) 07/29/2022   GLUCOSE 174 (H) 07/29/2022   BUN 14 07/29/2022   CREATININE 0.94 07/29/2022   BILITOT <0.2 10/07/2021   ALKPHOS 87 10/07/2021   AST 17 10/07/2021   ALT 6 10/07/2021   PROT 7.2 10/07/2021   ALBUMIN 3.8 10/07/2021   CALCIUM 8.8 (L) 07/29/2022   ANIONGAP 6 07/29/2022   EGFR 61 01/28/2022   Lab Results  Component Value Date   CHOL 123 04/21/2022   Lab Results  Component Value Date   HDL 42 04/21/2022   Lab Results  Component Value Date   LDLCALC 53 04/21/2022   Lab Results  Component Value Date   TRIG 141 04/21/2022   Lab Results  Component Value Date   CHOLHDL 2.9 04/21/2022   Lab Results  Component Value Date   HGBA1C 8.5 (A) 06/14/2022      Assessment & Plan:   Problem List Items Addressed This Visit       Cardiovascular and Mediastinum   HYPERTENSION, BENIGN SYSTEMIC (Chronic)    Blood pressure now at goal no change in medications      Heart block AV complete (HCC)    Cannot have Latuda due to arrhythmias and QTc intervals      Chronic combined systolic and diastolic CHF (congestive heart failure) (HCC)    No change in medications      Heart failure with mid-range ejection fraction (HCC)     Heart failure and arrhythmia management per cardiology does have pacemaker in place        Digestive   Dental caries    Needs dental resources given list of Medicaid dentists        Endocrine   Type 2 diabetes mellitus with hyperglycemia, with long-term current use of insulin ([place])    Management per endocrinology        Other   Hypercholesteremia    Continue statin therapy      Bipolar 1 disorder ([place])    Needs a mental health provider have referred the patient to [facility] takes Medicaid      Prolonged QT interval    Because of this cannot prescribe Latuda will refer to mental health provider      Alcohol use    Currently not using alcohol we will monitor      Left below-knee amputee ([place])    Left amputation persists      Right great toe amputee ([place])    Persistence of right great toe amputation wound is healed no new ulcerations      Other Visit Diagnoses     Encounter for screening mammogram for malignant neoplasm of breast    -  Primary   Relevant Orders   MM Digital Screening   Colon cancer screening       Relevant Orders  Cologuard   Need for immunization against influenza       Relevant Orders   Flu Vaccine QUAD 67mo+IM (Fluarix, Fluzone & Alfiuria Quad PF) (Completed)     Also needs a mammogram will order and needs colon cancer screening we will order Cologuard  Follow-up: Return in about 4 months (around 12/10/2022) for htn, diabetes, bipolar, heart failure.  38 minutes spent on this exam complex decision making is high multiple systems assessed  Asencion Noble, MD

## 2022-08-10 ENCOUNTER — Other Ambulatory Visit (HOSPITAL_COMMUNITY): Payer: Self-pay | Admitting: Cardiology

## 2022-08-10 ENCOUNTER — Encounter: Payer: Self-pay | Admitting: Critical Care Medicine

## 2022-08-10 ENCOUNTER — Other Ambulatory Visit (HOSPITAL_COMMUNITY): Payer: Medicaid Other

## 2022-08-10 ENCOUNTER — Ambulatory Visit: Payer: Medicaid Other | Attending: Critical Care Medicine | Admitting: Critical Care Medicine

## 2022-08-10 VITALS — BP 132/80 | HR 89 | Ht 64.0 in | Wt 209.2 lb

## 2022-08-10 DIAGNOSIS — I5042 Chronic combined systolic (congestive) and diastolic (congestive) heart failure: Secondary | ICD-10-CM | POA: Diagnosis not present

## 2022-08-10 DIAGNOSIS — E669 Obesity, unspecified: Secondary | ICD-10-CM

## 2022-08-10 DIAGNOSIS — Z23 Encounter for immunization: Secondary | ICD-10-CM

## 2022-08-10 DIAGNOSIS — K029 Dental caries, unspecified: Secondary | ICD-10-CM

## 2022-08-10 DIAGNOSIS — Z789 Other specified health status: Secondary | ICD-10-CM

## 2022-08-10 DIAGNOSIS — I442 Atrioventricular block, complete: Secondary | ICD-10-CM

## 2022-08-10 DIAGNOSIS — E7800 Pure hypercholesterolemia, unspecified: Secondary | ICD-10-CM

## 2022-08-10 DIAGNOSIS — F319 Bipolar disorder, unspecified: Secondary | ICD-10-CM | POA: Diagnosis not present

## 2022-08-10 DIAGNOSIS — I10 Essential (primary) hypertension: Secondary | ICD-10-CM | POA: Diagnosis not present

## 2022-08-10 DIAGNOSIS — Z89512 Acquired absence of left leg below knee: Secondary | ICD-10-CM

## 2022-08-10 DIAGNOSIS — E1165 Type 2 diabetes mellitus with hyperglycemia: Secondary | ICD-10-CM

## 2022-08-10 DIAGNOSIS — R9431 Abnormal electrocardiogram [ECG] [EKG]: Secondary | ICD-10-CM

## 2022-08-10 DIAGNOSIS — Z794 Long term (current) use of insulin: Secondary | ICD-10-CM

## 2022-08-10 DIAGNOSIS — Z6835 Body mass index (BMI) 35.0-35.9, adult: Secondary | ICD-10-CM | POA: Diagnosis not present

## 2022-08-10 DIAGNOSIS — I110 Hypertensive heart disease with heart failure: Secondary | ICD-10-CM | POA: Diagnosis not present

## 2022-08-10 DIAGNOSIS — Z89411 Acquired absence of right great toe: Secondary | ICD-10-CM | POA: Diagnosis not present

## 2022-08-10 DIAGNOSIS — Z1231 Encounter for screening mammogram for malignant neoplasm of breast: Secondary | ICD-10-CM

## 2022-08-10 DIAGNOSIS — Z1211 Encounter for screening for malignant neoplasm of colon: Secondary | ICD-10-CM

## 2022-08-10 DIAGNOSIS — Z950 Presence of cardiac pacemaker: Secondary | ICD-10-CM | POA: Diagnosis not present

## 2022-08-10 DIAGNOSIS — I5022 Chronic systolic (congestive) heart failure: Secondary | ICD-10-CM

## 2022-08-10 MED ORDER — LAMOTRIGINE 100 MG PO TABS: 100.0000 mg | ORAL_TABLET | Freq: Every day | ORAL | 2 refills | Status: DC

## 2022-08-10 NOTE — Assessment & Plan Note (Signed)
Needs dental resources given list of Medicaid dentists

## 2022-08-10 NOTE — Assessment & Plan Note (Signed)
Needs a mental health provider have referred the patient to [facility] takes [facility]

## 2022-08-10 NOTE — Patient Instructions (Addendum)
Sarah Long is not indicated because of your arrhythmia and heart condition now and you cannot take lamictal so you need to go to a mental health provider. See below  Dental resources were given  Keep follow-up appointments with cardiology and your endocrinologist  Cologuard kit will be mailed to your home for colon cancer screening and another mammogram will be ordered  Flu vaccine was given  You would be best seen and provided for by psychiatrist for your bipolar you should go to the [facility] walk-in clinic as noted below.  They take Medicaid.  Return to Dr. Joya Gaskins 4 months  [facility] [address], [place], [place] [address] [phone] or [phone] Walk-in urgent care 24/7 for anyone  For [facility] ONLY New patient assessments and therapy walk-ins: Monday and Wednesday 8am-11am First and second Friday 1pm-5pm New patient psychiatry and medication management walk-ins: Mondays, Wednesdays, Thursdays, Fridays 8am-11am No psychiatry walk-ins on Tuesday

## 2022-08-10 NOTE — Assessment & Plan Note (Signed)
Persistence of right great toe amputation wound is healed no new ulcerations

## 2022-08-10 NOTE — Assessment & Plan Note (Signed)
Left amputation persists

## 2022-08-10 NOTE — Assessment & Plan Note (Signed)
Management per endocrinology.

## 2022-08-10 NOTE — Assessment & Plan Note (Signed)
No change in medications

## 2022-08-10 NOTE — Assessment & Plan Note (Signed)
Heart failure and arrhythmia management per cardiology does have pacemaker in place

## 2022-08-10 NOTE — Assessment & Plan Note (Signed)
Continue statin therapy.

## 2022-08-10 NOTE — Assessment & Plan Note (Signed)
Because of this cannot prescribe [name] will refer to mental health provider

## 2022-08-10 NOTE — Assessment & Plan Note (Signed)
Blood pressure now at goal no change in medications

## 2022-08-10 NOTE — Assessment & Plan Note (Signed)
Currently not using alcohol we will monitor

## 2022-08-10 NOTE — Assessment & Plan Note (Signed)
Cannot have Latuda due to arrhythmias and QTc intervals

## 2022-08-11 ENCOUNTER — Other Ambulatory Visit (HOSPITAL_COMMUNITY): Payer: Self-pay | Admitting: Emergency Medicine

## 2022-08-11 NOTE — Progress Notes (Signed)
Paramedicine Encounter    Patient ID: Sarah Long, female    DOB: 1971-09-15, 51 y.o.   MRN: [id number]   BP (!) 150/98 (BP Location: Left Arm, Patient Position: Sitting, Cuff Size: Normal)   Pulse 90   Resp 16   Wt 210 lb 9.6 oz (95.5 kg)   LMP 07/09/2022 (Exact Date)   SpO2 97%   BMI 36.15 kg/m  Weight yesterday-not taken Last visit weight-206lb CBG 203  ATF Ms. Keshishyan A&O x 4, skin W&D w/ good color.  Pt moved in with family on [address] so she can save some money so she can try and find another place to live for her and her children.  She has been doing better  with her medicines but still needs improvement. I'm hoping with a more positive living environment she will be motivated to be more focused on taking her meds and making better nutritional choices.  Med box reconciled for 2 weeks.  Refill called in to [facility] for Losartan and pt.states she will go by pharmacy to pick up script.  Reminded her of delivery service but that she needs to make [facility] aware of her recent move and new address.  Pt. Denies chest pain or SOB.  Lung sounds clear and equal bilat.  No edema noted and no obvious JVD.  Will start visits every two weeks.  Home visit complete.    [name], [place] 08/11/2022    Patient Care Team: [name], MD as PCP - General (Pulmonary Disease) Sueanne Margarita, MD as PCP - Cardiology (Cardiology) Constance Haw, MD as PCP - Electrophysiology (Cardiology) Osker Mason, RPH-CPP (Pharmacist) Ethelda Chick as Social Worker  Patient Active Problem List   Diagnosis Date Noted   Peripheral vascular disease, unspecified ([place]) 03/22/2022   Type 2 diabetes mellitus with diabetic polyneuropathy, with long-term current use of insulin ([place]) 02/03/2022   Type 2 diabetes mellitus with hyperglycemia, with long-term current use of insulin ([place]) 02/03/2022   Microalbuminuria 02/03/2022   Homelessness 11/19/2021   Right great toe  amputee ([place]) 11/03/2021   Class 2 severe obesity due to excess calories with serious comorbidity and body mass index (BMI) of 36.0 to 36.9 in adult [facility]) 10/15/2021   Insomnia 10/07/2021   Left below-knee amputee ([place]) 07/01/2021   Normocytic anemia 06/26/2021   Heart failure with mid-range ejection fraction ([place]) 06/26/2021   Diabetic nephropathy with proteinuria ([place]) 06/26/2021   Dental caries 04/01/2020   Cracked tooth 04/01/2020   Alcohol use 03/11/2020   Vitamin D deficiency 03/11/2020   Prolonged QT interval 02/26/2020   Chronic combined systolic and diastolic CHF (congestive heart failure) ([place]) 02/26/2020   Heart block AV complete ([place]) 02/11/2020   Exocrine pancreatic insufficiency    Dysmenorrhea 10/02/2019   DM type 2 with diabetic peripheral neuropathy ([place]) 05/30/2019   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 block) 05/29/2019   Former smoker 11/26/2013   Hypercholesteremia 02/02/2007   Bipolar 1 disorder ([place]) 02/02/2007   HYPERTENSION, BENIGN SYSTEMIC 02/02/2007    Current Outpatient Medications:    acetaminophen (TYLENOL) 325 MG tablet, Take 650 mg by mouth every 6 (six) hours as needed (pain)., Disp: , Rfl:    atorvastatin (LIPITOR) 20 MG tablet, Take 1 tablet (20 mg total) by mouth daily., Disp: 90 tablet, Rfl: 3   carvedilol (COREG) 6.25 MG tablet, Take 1 tablet (6.25 mg total) by mouth 2 (two) times daily with a meal., Disp: 180  tablet, Rfl: 3   Continuous Blood Gluc Sensor (DEXCOM G6 SENSOR) MISC, 1 Device by Does not apply route as directed., Disp: 9 each, Rfl: 3   Continuous Blood Gluc Transmit (DEXCOM G6 TRANSMITTER) MISC, 1 Device by Does not apply route as directed., Disp: 1 each, Rfl: 3   furosemide (LASIX) 20 MG tablet, Take 2 tablets (40 mg total) by mouth daily., Disp: 180 tablet, Rfl: 3   insulin glargine (LANTUS SOLOSTAR) 100 UNIT/ML Solostar Pen, Inject 70 Units into the skin daily., Disp: 60 mL, Rfl: 3   insulin lispro  (HUMALOG) 100 UNIT/ML KwikPen, max daily dose: 70 units, Disp: 60 mL, Rfl: 3   Insulin Pen Needle 31G X 5 MM MISC, Use as directed in the morning, at noon, in the evening, and at bedtime., Disp: 400 each, Rfl: 3   latanoprost (XALATAN) 0.005 % ophthalmic solution, Place 1 drop into both eyes at bedtime., Disp: , Rfl:    losartan (COZAAR) 25 MG tablet, Take 2 tablets (50 mg total) by mouth daily., Disp: 90 tablet, Rfl: 3   spironolactone (ALDACTONE) 25 MG tablet, Take 1 tablet (25 mg total) by mouth daily., Disp: 90 tablet, Rfl: 3   Blood Pressure Monitoring (BLOOD PRESSURE MONITOR 7) DEVI, Measure blood pressure daily (Patient not taking: Reported on 08/11/2022), Disp: 1 each, Rfl: 0   Insulin Disposable Pump (OMNIPOD 5 G6 INTRO, GEN 5,) KIT, 1 Device by Does not apply route every other day. (Patient not taking: Reported on 08/11/2022), Disp: 1 kit, Rfl: 0   Insulin Disposable Pump (OMNIPOD 5 G6 POD, GEN 5,) MISC, 1 Device by Does not apply route every other day. (Patient not taking: Reported on 08/11/2022), Disp: 9 each, Rfl: 3 Allergies  Allergen Reactions   Lamictal [Lamotrigine] Itching   Strawberry Extract Hives   Sulfa Antibiotics Hives and Itching      Social History   Socioeconomic History   Marital status: Significant Other    Spouse name: Not on file   Number of children: Not on file   Years of education: Not on file   Highest education level: Not on file  Occupational History   Not on file  Tobacco Use   Smoking status: Former    Packs/day: 0.50    Years: 20.00    Total pack years: 10.00    Types: Cigarettes    Quit date: 07/01/2021    Years since quitting: 1.1   Smokeless tobacco: Never  Vaping Use   Vaping Use: Never used  Substance and Sexual Activity   Alcohol use: No   Drug use: No   Sexual activity: Yes    Birth control/protection: None  Other Topics Concern   Not on file  Social History Narrative   Not on file   Social Determinants of Health   Financial  Resource Strain: High Risk (08/02/2022)   Overall Financial Resource Strain (CARDIA)    Difficulty of Paying Living Expenses: Hard  Food Insecurity: Food Insecurity Present (08/02/2022)   Hunger Vital Sign    Worried About Running Out of Food in the Last Year: Often true    Ran Out of Food in the Last Year: Often true  Transportation Needs: Unmet Transportation Needs (04/01/2022)   PRAPARE - Hydrologist (Medical): Yes    Lack of Transportation (Non-Medical): Yes  Physical Activity: Not on file  Stress: Not on file  Social Connections: Not on file  Intimate Partner Violence: Not on file  Physical Exam      Future Appointments  Date Time Provider [place]  08/30/2022  9:30 AM [facility] PA/NP [facility] None  09/07/2022 10:20 AM Raulkar, Clide Deutscher, MD CPR-PRMA CPR  10/26/2022  6:10 PM [facility] DEVICE REMOTES [facility]  12/09/2022  9:45 AM Raulkar, Clide Deutscher, MD CPR-PRMA CPR  12/15/2022  9:50 AM Shamleffer, Melanie Crazier, MD LB[facility]ENDO None  01/25/2023  6:10 PM [facility] DEVICE REMOTES [facility]       [name], [place] Paramedic  08/11/22

## 2022-08-12 ENCOUNTER — Telehealth (HOSPITAL_COMMUNITY): Payer: Self-pay

## 2022-08-12 NOTE — Telephone Encounter (Addendum)
Pt aware, agreeable, and verbalized understanding of watching potassium in her diet. Informed her once the rest of her results come back she will be notified    ----- Message from Larey Dresser, MD sent at 08/11/2022  2:12 PM EDT ----- Still pending alpha galactosidase result.

## 2022-08-16 ENCOUNTER — Telehealth (HOSPITAL_COMMUNITY): Payer: Self-pay | Admitting: Licensed Clinical Social Worker

## 2022-08-16 LAB — BASIC METABOLIC PANEL
BUN/Creatinine Ratio: 21 (ref 9–23)
BUN: 27 mg/dL — ABNORMAL HIGH (ref 6–24)
CO2: 18 mmol/L — ABNORMAL LOW (ref 20–29)
Calcium: 8.9 mg/dL (ref 8.7–10.2)
Chloride: 107 mmol/L — ABNORMAL HIGH (ref 96–106)
Creatinine, Ser: 1.29 mg/dL — ABNORMAL HIGH (ref 0.57–1.00)
Glucose: 110 mg/dL — ABNORMAL HIGH (ref 70–99)
Potassium: 5.1 mmol/L (ref 3.5–5.2)
Sodium: 139 mmol/L (ref 134–144)
eGFR: 51 mL/min/{1.73_m2} — ABNORMAL LOW (ref >59)

## 2022-08-16 LAB — SPECIMEN STATUS REPORT

## 2022-08-16 LAB — ALPHA GALACTOSIDASE: Alpha-Galactosidase activity: 20.1 nmol/hr/mg prt — ABNORMAL LOW (ref >35.5)

## 2022-08-16 NOTE — Telephone Encounter (Signed)
H&V Care Navigation CSW Progress Note  Clinical Social Worker contacted by patient to request help finding place to rent.  States she was approved for Section 8 voucher in august for $1,600 but has yet to find somewhere to rent.  CSW assisted in searching for options within her budget for Section 8 housing and sent her list of apartments and homes that fit her criteria.  Pt to look into housing options.  Discussed with patient that we could potentially help with security deposit or start up fees once she finds a place.  Patient is participating in a Managed Medicaid Plan:  Yes  [place]: Food Insecurity Present (08/02/2022)  Housing: High Risk (08/16/2022)  Transportation Needs: Unmet Transportation Needs (04/01/2022)  Depression (PHQ2-9): High Risk (08/10/2022)  Financial Resource Strain: High Risk (08/02/2022)  Tobacco Use: Medium Risk (08/10/2022)   Will continue to follow and assist as needed  Jorge Ny, [facility] Desk#: [phone] Cell#: [phone]

## 2022-08-24 NOTE — Progress Notes (Signed)
Remote pacemaker transmission.   

## 2022-08-25 ENCOUNTER — Encounter: Payer: Medicaid Other | Attending: Critical Care Medicine | Admitting: Nutrition

## 2022-08-25 DIAGNOSIS — Z794 Long term (current) use of insulin: Secondary | ICD-10-CM | POA: Insufficient documentation

## 2022-08-25 DIAGNOSIS — E1142 Type 2 diabetes mellitus with diabetic polyneuropathy: Secondary | ICD-10-CM | POA: Insufficient documentation

## 2022-08-25 NOTE — Progress Notes (Signed)
Patient was trained on the use of the OmniPod 5.  Settings were put in per Dr. Quin Hoop orders.  Basal rate: 2.0u/hr. I/C: 1 with written instructions for 14u ac all meals.  ISF: 25, target: 110 with correction over 110, timing: 4 hours, Max bolus 30, Max basal : 4.0u/hr.   She linked her dexcom to her PDM and the PDM was linked to [place].  She filled a pod with Humalog insulin and attached the pod to her upper left arm.  She was show, and redemonstrated how to give a meal time bolus and correction bolus, and she reported good understanding of this.  We reviewed alerts and alarms, high blood sugar protocols, and what IOB mean.  She reported good understanding of these topics and had no final questions.

## 2022-08-25 NOTE — Patient Instructions (Signed)
Read over starter booklet and manual Call Dexcom if questions about linking to PDM Call OmniPod if questions about the Pump. Call office if blood sugars remain high or drop low.

## 2022-08-26 DIAGNOSIS — H5213 Myopia, bilateral: Secondary | ICD-10-CM | POA: Diagnosis not present

## 2022-08-27 ENCOUNTER — Telehealth: Payer: Self-pay | Admitting: Nutrition

## 2022-08-27 ENCOUNTER — Other Ambulatory Visit (HOSPITAL_COMMUNITY): Payer: Self-pay | Admitting: Emergency Medicine

## 2022-08-27 NOTE — Progress Notes (Signed)
Paramedicine Encounter    Patient ID: Sarah Long, female    DOB: 11-06-1971, 51 y.o.   MRN: [id number]   BP (!) 140/70 (BP Location: Right Arm, Patient Position: Sitting, Cuff Size: Normal)   Resp 16   Wt 215 lb 3.2 oz (97.6 kg)   BMI 36.94 kg/m  Weight yesterday-not taken Last visit weight-210lb  Home visit with 2 week med rec today.  She has improved on her med compliance this visit but still isn't 100%.  She has received her insulin pump and she says she is having problems with it constantly alarming.  I beeped to let her know her sugar was 47 and she says, "I feel fine, I'm not shaking or anything. This machine is driving me crazy."  I checked her sugar with my glucometer and got a reading of 221.  Pt. Denies chest pain or SOB.  Lung sounds clear and equal bilat.  Her weight is up 5lbs but that's over a 2 week period.  She is wearing a compression sock on her right leg.  I advised her to keep an eye out for swelling.   Sarah Long seems to be doing well working and says there is a good possibility that she has found an apartment for her and her children.  She has a clinic visit scheduled for Monday.  Also, I requested that prior to my next home visit with her that she reconcile her own pill box and I will review it for accuracy.  She agreed to same.  Home visit complete.    [name], [place] 08/27/2022  Patient Care Team: [name], MD as PCP - General (Pulmonary Disease) Sueanne Margarita, MD as PCP - Cardiology (Cardiology) Constance Haw, MD as PCP - Electrophysiology (Cardiology) Osker Mason, RPH-CPP (Pharmacist) Ethelda Chick as Social Worker  Patient Active Problem List   Diagnosis Date Noted   Peripheral vascular disease, unspecified ([place]) 03/22/2022   Type 2 diabetes mellitus with diabetic polyneuropathy, with long-term current use of insulin ([place]) 02/03/2022   Type 2 diabetes mellitus with hyperglycemia, with long-term current  use of insulin ([place]) 02/03/2022   Microalbuminuria 02/03/2022   Homelessness 11/19/2021   Right great toe amputee ([place]) 11/03/2021   Class 2 severe obesity due to excess calories with serious comorbidity and body mass index (BMI) of 36.0 to 36.9 in adult [facility]) 10/15/2021   Insomnia 10/07/2021   Left below-knee amputee ([place]) 07/01/2021   Normocytic anemia 06/26/2021   Heart failure with mid-range ejection fraction ([place]) 06/26/2021   Diabetic nephropathy with proteinuria ([place]) 06/26/2021   Dental caries 04/01/2020   Cracked tooth 04/01/2020   Alcohol use 03/11/2020   Vitamin D deficiency 03/11/2020   Prolonged QT interval 02/26/2020   Chronic combined systolic and diastolic CHF (congestive heart failure) ([place]) 02/26/2020   Heart block AV complete ([place]) 02/11/2020   Exocrine pancreatic insufficiency    Dysmenorrhea 10/02/2019   DM type 2 with diabetic peripheral neuropathy ([place]) 05/30/2019   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 block) 05/29/2019   Former smoker 11/26/2013   Hypercholesteremia 02/02/2007   Bipolar 1 disorder ([place]) 02/02/2007   HYPERTENSION, BENIGN SYSTEMIC 02/02/2007    Current Outpatient Medications:    acetaminophen (TYLENOL) 325 MG tablet, Take 650 mg by mouth every 6 (six) hours as needed (pain)., Disp: , Rfl:    atorvastatin (LIPITOR) 20 MG tablet, Take 1 tablet (20 mg total) by mouth daily., Disp: 90 tablet, Rfl:  3   carvedilol (COREG) 6.25 MG tablet, Take 1 tablet (6.25 mg total) by mouth 2 (two) times daily with a meal., Disp: 180 tablet, Rfl: 3   Continuous Blood Gluc Sensor (DEXCOM G6 SENSOR) MISC, 1 Device by Does not apply route as directed., Disp: 9 each, Rfl: 3   Continuous Blood Gluc Transmit (DEXCOM G6 TRANSMITTER) MISC, 1 Device by Does not apply route as directed., Disp: 1 each, Rfl: 3   furosemide (LASIX) 20 MG tablet, Take 2 tablets (40 mg total) by mouth daily., Disp: 180 tablet, Rfl: 3   Insulin Disposable Pump (OMNIPOD 5  G6 INTRO, GEN 5,) KIT, 1 Device by Does not apply route every other day., Disp: 1 kit, Rfl: 0   Insulin Disposable Pump (OMNIPOD 5 G6 POD, GEN 5,) MISC, 1 Device by Does not apply route every other day., Disp: 9 each, Rfl: 3   insulin lispro (HUMALOG) 100 UNIT/ML KwikPen, max daily dose: 70 units, Disp: 60 mL, Rfl: 3   latanoprost (XALATAN) 0.005 % ophthalmic solution, Place 1 drop into both eyes at bedtime., Disp: , Rfl:    losartan (COZAAR) 25 MG tablet, Take 2 tablets (50 mg total) by mouth daily., Disp: 90 tablet, Rfl: 3   spironolactone (ALDACTONE) 25 MG tablet, Take 1 tablet (25 mg total) by mouth daily., Disp: 90 tablet, Rfl: 3   Blood Pressure Monitoring (BLOOD PRESSURE MONITOR 7) DEVI, Measure blood pressure daily (Patient not taking: Reported on 08/11/2022), Disp: 1 each, Rfl: 0   insulin glargine (LANTUS SOLOSTAR) 100 UNIT/ML Solostar Pen, Inject 70 Units into the skin daily. (Patient not taking: Reported on 08/27/2022), Disp: 60 mL, Rfl: 3   Insulin Pen Needle 31G X 5 MM MISC, Use as directed in the morning, at noon, in the evening, and at bedtime. (Patient not taking: Reported on 08/27/2022), Disp: 400 each, Rfl: 3 Allergies  Allergen Reactions   Lamictal [Lamotrigine] Itching   Strawberry Extract Hives   Sulfa Antibiotics Hives and Itching      Social History   Socioeconomic History   Marital status: Significant Other    Spouse name: Not on file   Number of children: Not on file   Years of education: Not on file   Highest education level: Not on file  Occupational History   Not on file  Tobacco Use   Smoking status: Former    Packs/day: 0.50    Years: 20.00    Total pack years: 10.00    Types: Cigarettes    Quit date: 07/01/2021    Years since quitting: 1.1   Smokeless tobacco: Never  Vaping Use   Vaping Use: Never used  Substance and Sexual Activity   Alcohol use: No   Drug use: No   Sexual activity: Yes    Birth control/protection: None  Other Topics Concern    Not on file  Social History Narrative   Not on file   Social Determinants of Health   Financial Resource Strain: High Risk (08/02/2022)   Overall Financial Resource Strain (CARDIA)    Difficulty of Paying Living Expenses: Hard  Food Insecurity: Food Insecurity Present (08/02/2022)   Hunger Vital Sign    Worried About Running Out of Food in the Last Year: Often true    Ran Out of Food in the Last Year: Often true  Transportation Needs: Unmet Transportation Needs (04/01/2022)   PRAPARE - Hydrologist (Medical): Yes    Lack of Transportation (Non-Medical): Yes  Physical  Activity: Not on file  Stress: Not on file  Social Connections: Not on file  Intimate Partner Violence: Not on file    Physical Exam      Future Appointments  Date Time Provider [place]  08/30/2022  9:30 AM [facility] PA/NP MC-[facility] None  09/07/2022 10:20 AM Raulkar, Clide Deutscher, MD CPR-PRMA CPR  09/24/2022  1:50 PM GI-BCG MM 3 GI-BCGMM GI-BREAST CE  10/26/2022  6:10 PM [facility] DEVICE REMOTES [facility]  12/09/2022  9:45 AM Raulkar, Clide Deutscher, MD CPR-PRMA CPR  12/14/2022  2:00 PM Debbe Mounts, PhD [facility]  12/15/2022  9:50 AM Shamleffer, Melanie Crazier, MD [facility] None  01/25/2023  6:10 PM [facility] DEVICE REMOTES [facility]       [name], [place] Paramedic  08/27/22

## 2022-08-27 NOTE — Telephone Encounter (Signed)
Patient reports that her Dexcom readings would not link to her PDM.  She called Dexcom and they talked her through making it link.  She reports that the link does not hold, and most of the time it keeps alarming saying either that there is no connection, or that it is reading very low.  [place] AM it read 68, then 50.  She was asymptomatic and has not given a bolus yesterday at all.  The last time the dexcom reading appeared onher PDM was at [facility] today.  It read 156 30 PC lunch. She was told to test blood sugar with her meter to verify a low, and if it is low, to call the office.  She reports that she has moved this week, and can not find her meter.  Explained that this was important and she needs to get help from her family to find the meter and to verify if readings are low. I was not able to see her readings on Clarity.  She reported that when she called Dexcom, she was told to delete both apps on her phone and reinstall, and she never downloaded the Clarity app back. I could hear her pump beeping now and the alert on her PDM says connection is lost.  She was told to call Dexcom now and ask for a new transmitter and sensor and to ask for help to reinstall her Clarity app with her user name and password from her G6 app.  She agreed to do this and to call me in the AM with updates on this. We reviewed low blood sugar treatments and the need to call the office if readings are less than 70 from her meter.   Discussed also the need to bolus for all meal and snacks.  She reported good understanding of this.  She has eaten only 2 meals today and says without  bolusing for any, and says she is not symptomatic  of high blood sugars  Stressed again need to test blood sugars with no readings from Dexcom.  She agreed to do this, and to call me in the AM.

## 2022-08-30 ENCOUNTER — Telehealth: Payer: Self-pay | Admitting: Nutrition

## 2022-08-30 ENCOUNTER — Telehealth (HOSPITAL_COMMUNITY): Payer: Self-pay

## 2022-08-30 ENCOUNTER — Encounter (HOSPITAL_COMMUNITY): Payer: Self-pay

## 2022-08-30 ENCOUNTER — Ambulatory Visit (HOSPITAL_COMMUNITY)
Admission: RE | Admit: 2022-08-30 | Discharge: 2022-08-30 | Disposition: A | Discharge status: Home / Self Care | Payer: Medicaid Other | Source: Ambulatory Visit | Attending: Adult Health | Admitting: Adult Health

## 2022-08-30 ENCOUNTER — Encounter: Payer: Medicaid Other | Admitting: Nutrition

## 2022-08-30 VITALS — BP 138/82 | HR 73 | Wt 215.4 lb

## 2022-08-30 DIAGNOSIS — Z6836 Body mass index (BMI) 36.0-36.9, adult: Secondary | ICD-10-CM | POA: Diagnosis not present

## 2022-08-30 DIAGNOSIS — I442 Atrioventricular block, complete: Secondary | ICD-10-CM

## 2022-08-30 DIAGNOSIS — I5022 Chronic systolic (congestive) heart failure: Secondary | ICD-10-CM

## 2022-08-30 DIAGNOSIS — Z79899 Other long term (current) drug therapy: Secondary | ICD-10-CM | POA: Diagnosis not present

## 2022-08-30 DIAGNOSIS — I428 Other cardiomyopathies: Secondary | ICD-10-CM | POA: Diagnosis not present

## 2022-08-30 DIAGNOSIS — I110 Hypertensive heart disease with heart failure: Secondary | ICD-10-CM | POA: Insufficient documentation

## 2022-08-30 DIAGNOSIS — F17210 Nicotine dependence, cigarettes, uncomplicated: Secondary | ICD-10-CM | POA: Diagnosis not present

## 2022-08-30 DIAGNOSIS — Z89512 Acquired absence of left leg below knee: Secondary | ICD-10-CM | POA: Diagnosis not present

## 2022-08-30 DIAGNOSIS — Z139 Encounter for screening, unspecified: Secondary | ICD-10-CM

## 2022-08-30 DIAGNOSIS — Z794 Long term (current) use of insulin: Secondary | ICD-10-CM

## 2022-08-30 DIAGNOSIS — Z720 Tobacco use: Secondary | ICD-10-CM

## 2022-08-30 DIAGNOSIS — E1169 Type 2 diabetes mellitus with other specified complication: Secondary | ICD-10-CM

## 2022-08-30 DIAGNOSIS — E119 Type 2 diabetes mellitus without complications: Secondary | ICD-10-CM | POA: Diagnosis not present

## 2022-08-30 DIAGNOSIS — E669 Obesity, unspecified: Secondary | ICD-10-CM | POA: Insufficient documentation

## 2022-08-30 DIAGNOSIS — I10 Essential (primary) hypertension: Secondary | ICD-10-CM

## 2022-08-30 DIAGNOSIS — R0600 Dyspnea, unspecified: Secondary | ICD-10-CM | POA: Diagnosis not present

## 2022-08-30 DIAGNOSIS — E1142 Type 2 diabetes mellitus with diabetic polyneuropathy: Secondary | ICD-10-CM

## 2022-08-30 DIAGNOSIS — E785 Hyperlipidemia, unspecified: Secondary | ICD-10-CM

## 2022-08-30 LAB — BASIC METABOLIC PANEL
Anion gap: 6 (ref 5–15)
BUN: 17 mg/dL (ref 6–20)
CO2: 21 mmol/L — ABNORMAL LOW (ref 22–32)
Calcium: 8.8 mg/dL — ABNORMAL LOW (ref 8.9–10.3)
Chloride: 110 mmol/L (ref 98–111)
Creatinine, Ser: 1.1 mg/dL — ABNORMAL HIGH (ref 0.44–1.00)
GFR, Estimated: >60 mL/min (ref >60)
Glucose, Bld: 287 mg/dL — ABNORMAL HIGH (ref 70–99)
Potassium: 4.5 mmol/L (ref 3.5–5.1)
Sodium: 137 mmol/L (ref 135–145)

## 2022-08-30 LAB — BRAIN NATRIURETIC PEPTIDE: B Natriuretic Peptide: 485.1 pg/mL — ABNORMAL HIGH (ref 0.0–100.0)

## 2022-08-30 MED ORDER — FUROSEMIDE 20 MG PO TABS: ORAL_TABLET | ORAL | 2 refills | Status: DC

## 2022-08-30 MED ORDER — ISOSORB DINITRATE-HYDRALAZINE 20-37.5 MG PO TABS: 0.5000 | ORAL_TABLET | Freq: Three times a day (TID) | ORAL | 6 refills | Status: DC

## 2022-08-30 MED ORDER — POTASSIUM CHLORIDE CRYS ER 10 MEQ PO TBCR: 10.0000 meq | EXTENDED_RELEASE_TABLET | Freq: Every day | ORAL | 11 refills | Status: DC

## 2022-08-30 MED ORDER — INSULIN LISPRO 100 UNIT/ML IJ SOLN: INTRAMUSCULAR | 11 refills | Status: DC

## 2022-08-30 NOTE — Telephone Encounter (Signed)
Patient is asking that you call In the insulin (Humalog)in a vial for her for her OmniPod pump.  She is using 125u/day.  She will need 4 vials per month.  She is using [facility].  Thank you

## 2022-08-30 NOTE — Addendum Note (Signed)
Encounter addended by: Rafael Bihari, FNP on: 08/30/2022 4:49 PM  Actions taken: Clinical Note Signed, Result note filed

## 2022-08-30 NOTE — Progress Notes (Addendum)
PCP: [name], MD Endocrinology: Dr. Kelton Pillar EP: Dr. Curt Bears Cardiology: Dr. Aundra Dubin  51 y.o. with history of type 2 diabetes, HTN, hyperlipidemia, complete heart block with PPM, and chronic diastolic CHF was referred by Dr. Joya Gaskins for evaluation of CHF.  Patient has had diabetes since age 20 and has had HTN for at least 5 years.  She is a smoker and has a history of prior ETOH abuse with ETOH pancreatitis. In 3/21, she was admitted with DKA.  She was found to have associated complete heart block that did not resolve, she had St Jude PPM placed.  She had an echocardiogram showing EF 40-45% with severe LV hypertrophy.  She did not have a cardiac MRI prior to PPM placement.  Cath in 3/21 showed no significant CAD (nonischemic cardiomyopathy).   Last seen in 5/21, doing well but not taking any cardiac meds. Entresto started and started work up for cardiac sarcoidosis and cardiac amyloidosis.  Echo (12/22): EF 70-75%, severe LVH, normal RV, mild to moderate MR.  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 She has a functioning orthotic.   She had follow up with EP 1/23, PPM functioning appropriately. Daily lasix restarted and cMRI arranged.  cMRI (2/23) with LVEF 68%, severe LVH, ECV 54%, no LVOT gradient, poor nulling with uninterpretable delayed enhancement.   Follow up 5/23, NYHA II symptoms, volume overloaded. Lasix increase to 40 daily and genetic testing arranged to evaluate for TTR amyloidosis and hypertrophic cardiomyopathies. 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  Of note, "heart problems" run in family with mother and maternal grandparents having CHF.   Today she returns for HF follow up, here with paramedic Katie. Overall feeling fair. She has chest cramps when over-exerting herself. She is SOB with stairs or walking up inclines. She has 3 pillow orthopnea. Denies dizziness, edema, or PND.  Appetite ok. No fever or chills. Weight at  home 209-215 pounds. Taking all medications. Smokes 3-4 cigs/day, drinks beer 3-4x/week. She is now working as a Quarry manager part time.   Device interrogation (personally reviewed): 99% v-pacing, no AF, 1-2 hr/day activity.  ECG (personally reviewed): none ordered today.  Labs (4/21): K 4, creatinine 0.87 Labs (2/23): K 4.4, creatinine 1.12 Labs (5/23): K 5.2, creatinine 1.14, LDL 53, HDL 42 Labs (8/23): K 4.4, creatinine 1.48  PMH: 1. Type 2 diabetes: Diagnosed at age 44.  2. HTN 3. Hyperlipidemia 4. Active smoker 5. H/o ETOH pancreatitis 6. Bipolar disorder 7. Complete heart block: 3/21, associate with DKA episode.  Has St Jude PPM.  8. Chronic systolic CHF: Nonischemic cardiomyopathy.  Echo (3/21) with EF 40-45%, severe LVH, mild MR, normal RV.  - LHC (3/21) normal coronaries.  - PYP scan (6/21): Negative for TTR cardiac amyloidosis.  - cMRI (2/23): LVEF 68%, severe LVH, ECV 54%, no LVOT gradient, poor nulling with uninterpretable delayed enhancement.  - CTA chest (7/23): No evidence for pulmonary sarcoidosis, no ILD.  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  - Alpha-galactosidase activity testing low (20.1) 9. S/p left BKA (7/22). Has Phantom Limb pain.  SH: Divorced, un-employed, prior heavy ETOH now rare, smokes 5 cigarettes daily.  FH: Grandfather with CHF. No known sarcoidosis or amyloidosis.   ROS: All systems reviewed and negative except as per HPI.   Current Outpatient Medications  Medication Sig Dispense Refill   acetaminophen (TYLENOL) 325 MG tablet Take 650 mg by mouth every 6 (six)  hours as needed (pain).     atorvastatin (LIPITOR) 20 MG tablet Take 1 tablet (20 mg total) by mouth daily. 90 tablet 3   Blood Pressure Monitoring (BLOOD PRESSURE MONITOR 7) DEVI Measure blood pressure daily 1 each 0   carvedilol (COREG) 6.25 MG tablet Take 1 tablet (6.25 mg total) by mouth 2 (two) times daily with a meal. 180 tablet 3    Continuous Blood Gluc Sensor (DEXCOM G6 SENSOR) MISC 1 Device by Does not apply route as directed. 9 each 3   Continuous Blood Gluc Transmit (DEXCOM G6 TRANSMITTER) MISC 1 Device by Does not apply route as directed. 1 each 3   furosemide (LASIX) 20 MG tablet Take 2 tablets (40 mg total) by mouth daily. 180 tablet 3   Insulin Disposable Pump (OMNIPOD 5 G6 POD, GEN 5,) MISC 1 Device by Does not apply route every other day. 9 each 3   latanoprost (XALATAN) 0.005 % ophthalmic solution Place 1 drop into both eyes at bedtime.     losartan (COZAAR) 25 MG tablet Take 2 tablets (50 mg total) by mouth daily. 90 tablet 3   spironolactone (ALDACTONE) 25 MG tablet Take 1 tablet (25 mg total) by mouth daily. 90 tablet 3   Insulin Pen Needle 31G X 5 MM MISC Use as directed in the morning, at noon, in the evening, and at bedtime. (Patient not taking: Reported on 08/30/2022) 400 each 3   No current facility-administered medications for this encounter.   Wt Readings from Last 3 Encounters:  08/30/22 97.7 kg (215 lb 6.4 oz)  08/27/22 97.6 kg (215 lb 3.2 oz)  08/11/22 95.5 kg (210 lb 9.6 oz)   BP 138/82   Pulse 73   Wt 97.7 kg (215 lb 6.4 oz)   SpO2 98%   BMI 36.97 kg/m  Physical Exam: General:  NAD. No resp difficulty, walked into clinic. HEENT: Normal Neck: Supple. No JVD. Carotids 2+ bilat; no bruits. No lymphadenopathy or thryomegaly appreciated. Cor: PMI nondisplaced. Regular rate & rhythm. No rubs, gallops or murmurs. Lungs: Clear Abdomen: Obese, soft, nontender, nondistended. No hepatosplenomegaly. No bruits or masses. Good bowel sounds. Extremities: No cyanosis, clubbing, rash, edema; s/p L BKA Neuro: Alert & oriented x 3, cranial nerves grossly intact. Moves all 4 extremities w/o difficulty. Affect pleasant.  Assessment/Plan: 1. Chronic systolic CHF: Echo in 5/28 with EF 40-45%, severe LV hypertrophy.  This was found in association with complete heart block.  Nonischemic cardiomyopathy, cath in  3/21 showed no significant CAD.  PYP scan (6/21) not suggestive of TTR amyloidosis. cMRI (2/23) showed LVEF 68%, severe LVH, ECV 54%, no LVOT gradient, poor nulling with uninterpretable delayed enhancement. CTA chest did not show evidence for pulmonary sarcoidosis.  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  There is wide phenotypic variation in female Fabry's heterozygotes, but I suspect that the cardiomyopathy with LVH and h/o complete heart block in this situation is most likely due to Fabry's disease. She is not volume overloaded on exam with NYHA class II-III symptoms.  - Alpha-galactosidase activity testing not elevated. - She has been referred to Dr. Lattie Corns for genetic evaluation and has an appt 12/2022. Will message Dr. Broadus John to see if we can get her in sooner as she will need to be referred to a center specializing in treating Fabry's disease, likely will require agalsidase beta (Fabrazyme) infusions. Discussed with Dr. Aundra Dubin. Of note, there is a Conservation officer[facility] located in [place], [place]. Will see if we can refer her to this clinic. - Start BiDil 0.5 tab tid. - Continue Lasix 40 mg daily. BMET/BNP today. - Continue spironolactone 25 mg daily.  - Continue Coreg 6.25 mg bid. - Continue losartan 50 mg daily. - Consider SGLT2 inhibitor, but need to be careful with insulin-dependent DM. She has had issues with her continuous glucose monitoring device and requires a high-dose of insulin. Will defer starting SGLT2i for now. 2. HTN: Elevated today. - Start BiDil as above. 3. Hyperlipidemia: Good lipids 5/23. 4. Active smoker: I strongly urged her to quit.    - Continues with dyspnea, likely multifactorial but will arrange for PFTs. 5. Type 2 diabetes: Followed by Endocrinology. She is on insulin. 6. Complete heart block: She has a St Jude PPM and is pacing her RV > 99% of the time.  It is possible that this could be  related to Fabry's disease.  7. Obesity: Body mass index is 36.97 kg/m. - Considered referral for GLP1RA, but she has history of pancreatitis. 8. SDOH: Continue paramedicine, appreciate their assistance. - Waiting on disability. - Working as a Quarry manager part time.  Follow up in 3 months with Dr. Aundra Dubin.  Maricela Bo [facility] FNP-BC 08/30/2022

## 2022-08-30 NOTE — Telephone Encounter (Signed)
Patient advised and verbalized understanding,lab appointment scheduled,lab orders entered. Med list updated to reflect changes.   Orders Placed This Encounter  Procedures   Basic metabolic panel    Standing Status:   Future    Standing Expiration Date:   08/31/2023    Order Specific Question:   Release to patient    Answer:   Immediate   Meds ordered this encounter  Medications   potassium chloride SA (KLOR-CON M) 10 MEQ tablet    Sig: Take 1 tablet (10 mEq total) by mouth daily.    Dispense:  30 tablet    Refill:  11   furosemide (LASIX) 20 MG tablet    Sig: Take 2 tablets (40 mg total) by mouth every morning AND 1 tablet (20 mg total) every evening.    Dispense:  90 tablet    Refill:  2    Please cancel all previous orders for current medication. Change in dosage or pill size. Allow patient to charge   isosorbide-hydrALAZINE (BIDIL) 20-37.5 MG tablet    Sig: Take 0.5 tablets by mouth 3 (three) times daily.    Dispense:  45 tablet    Refill:  6

## 2022-08-30 NOTE — Telephone Encounter (Signed)
Patient reports that dexcom sensor "going off all night and day" with no connection, or low blood sugars.  Says she tested with a fingerstick and the sensor read 54, but blood sugar reading was 211.  Has called Dexcom 3 times since starting this pump and they have replaced sensors and transmitter, with no relief in alarms.   Scheduled patient ot come in Monday morning to review.

## 2022-08-30 NOTE — Patient Instructions (Addendum)
Thank you for coming in today  Labs were done today, if any labs are abnormal the clinic will call you No news is good news  Your physician recommends that you schedule a follow-up appointment in:  3 months with Dr. Aundra Dubin  START Bidil 0.5 tablet 3 times a day   You will be scheduled for Pulmonary function test you will be called for appointment details       Do the following things EVERYDAY: Weigh yourself in the morning before breakfast. Write it down and keep it in a log. Take your medicines as prescribed Eat low salt foods--Limit salt (sodium) to 2000 mg per day.  Stay as active as you can everyday Limit all fluids for the day to less than 2 liters  .At the [facility], you and your health needs are our priority. As part of our continuing mission to provide you with exceptional heart care, we have created designated Provider Care Teams. These Care Teams include your primary Cardiologist (physician) and Advanced Practice Providers (APPs- Physician Assistants and Nurse Practitioners) who all work together to provide you with the care you need, when you need it.   You may see any of the following providers on your designated Care Team at your next follow up: Dr Glori Bickers Dr Loralie Champagne Dr. Roxana Hires, NP Lyda Jester, [facility], [place] Forestine Na, NP Audry Riles, PharmD   Please be sure to bring in all your medications bottles to every appointment.   If you have any questions or concerns before your next appointment please send [place] a message through [place] or call our office at [phone].    TO LEAVE A MESSAGE FOR THE NURSE SELECT OPTION 2, PLEASE LEAVE A MESSAGE INCLUDING: YOUR NAME DATE OF BIRTH CALL BACK NUMBER REASON FOR CALL**this is important as we prioritize the call backs  YOU WILL RECEIVE A CALL BACK THE SAME DAY AS LONG AS YOU CALL BEFORE 4:00 PM

## 2022-08-30 NOTE — Telephone Encounter (Signed)
-----   Message from Rafael Bihari, May sent at 08/30/2022  4:49 PM EDT ----- Renal function OK. BNP remains elevated.   Increase lasix to 40 mg q am and 20 mg q pm. Start 10 KCL daily.  Repeat BMET and BNP in 10-14 days

## 2022-08-31 ENCOUNTER — Telehealth: Payer: Self-pay | Admitting: Nutrition

## 2022-08-31 ENCOUNTER — Other Ambulatory Visit (HOSPITAL_COMMUNITY): Payer: Self-pay | Admitting: Emergency Medicine

## 2022-08-31 NOTE — Patient Instructions (Addendum)
Charge PDM with charging cord at home.   IF PDM charges, start new pod.   Place pod on back of arm or on abdominal areas shown to you today. Call in AM. Charge pdm every 3 days.

## 2022-08-31 NOTE — Progress Notes (Signed)
Paramedicine Encounter    Patient ID: Sarah Long, female    DOB: [birth year], 51 y.o.   MRN: [id number]   BP (!) 140/94 (BP Location: Left Arm, Patient Position: Sitting, Cuff Size: Normal)   Pulse 72   Resp 16   Wt 214 lb 6.4 oz (97.3 kg)   BMI 36.80 kg/m  Weight yesterday-not taken Last visit weight-215.3lb cbg97  ATF Ms. Gillaspie A&O x 4, skin W&D w/ good color.  Visit today primarily to make the following med changes to her med box - Pot. Cl 3mq daily, Furosemide '40mg'$ . AM & '20mg'$  PM, Bidil 1/2 tablet TID.  She has no complaint of chest pain or SOB.  Lung sounds are clear and equal throughout.  No edema noted.  Next visit scheduled for 2 weeks out.  She will reconcile her own med box for next visit and I will review for accuracy. Home visit complete.    DRenee Ramus EMorning Sun9/26/2023   Patient Care Team: WElsie Stain MD as PCP - General (Pulmonary Disease) TSueanne Margarita MD as PCP - Cardiology (Cardiology) CConstance Haw MD as PCP - Electrophysiology (Cardiology) HOsker Mason RPH-CPP (Pharmacist) SEthelda Chickas Social Worker  Patient Active Problem List   Diagnosis Date Noted   Peripheral vascular disease, unspecified ([place] 03/22/2022   Type 2 diabetes mellitus with diabetic polyneuropathy, with long-term current use of insulin ([place] 02/03/2022   Type 2 diabetes mellitus with hyperglycemia, with long-term current use of insulin ([place] 02/03/2022   Microalbuminuria 02/03/2022   Homelessness 11/19/2021   Right great toe amputee (HV[place] 11/03/2021   Class 2 severe obesity due to excess calories with serious comorbidity and body mass index (BMI) of 36.0 to 36.9 in adult ([facility] 10/15/2021   Insomnia 10/07/2021   Left below-knee amputee (HL[place] 07/01/2021   Normocytic anemia 06/26/2021   Heart failure with mid-range ejection fraction ([place] 06/26/2021   Diabetic nephropathy with proteinuria ([place] 06/26/2021   Dyspnea    Dental caries  04/01/2020   Cracked tooth 04/01/2020   Alcohol use 03/11/2020   Vitamin D deficiency 03/11/2020   Prolonged QT interval 02/26/2020   Chronic combined systolic and diastolic CHF (congestive heart failure) (HD[place] 02/26/2020   Heart block AV complete ([place] 02/11/2020   Exocrine pancreatic insufficiency    Dysmenorrhea 10/02/2019   DM type 2 with diabetic peripheral neuropathy (HE[place] 05/30/2019   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 block) 05/29/2019   Former smoker 11/26/2013   Hypercholesteremia 02/02/2007   Bipolar 1 disorder ([place] 02/02/2007   HYPERTENSION, BENIGN SYSTEMIC 02/02/2007    Current Outpatient Medications:    acetaminophen (TYLENOL) 325 MG tablet, Take 650 mg by mouth every 6 (six) hours as needed (pain)., Disp: , Rfl:    atorvastatin (LIPITOR) 20 MG tablet, Take 1 tablet (20 mg total) by mouth daily., Disp: 90 tablet, Rfl: 3   carvedilol (COREG) 6.25 MG tablet, Take 1 tablet (6.25 mg total) by mouth 2 (two) times daily with a meal., Disp: 180 tablet, Rfl: 3   Continuous Blood Gluc Sensor (DEXCOM G6 SENSOR) MISC, 1 Device by Does not apply route as directed., Disp: 9 each, Rfl: 3   Continuous Blood Gluc Transmit (DEXCOM G6 TRANSMITTER) MISC, 1 Device by Does not apply route as directed., Disp: 1 each, Rfl: 3   furosemide (LASIX) 20 MG tablet, Take 2 tablets (40 mg total) by mouth every morning AND 1 tablet (20 mg total) every evening., Disp: 90 tablet,  Rfl: 2   Insulin Disposable Pump (OMNIPOD 5 G6 POD, GEN 5,) MISC, 1 Device by Does not apply route every other day., Disp: 9 each, Rfl: 3   insulin lispro (HUMALOG) 100 UNIT/ML injection, Max daily 125 units a day per pump, Disp: 40 mL, Rfl: 11   isosorbide-hydrALAZINE (BIDIL) 20-37.5 MG tablet, Take 0.5 tablets by mouth 3 (three) times daily., Disp: 45 tablet, Rfl: 6   latanoprost (XALATAN) 0.005 % ophthalmic solution, Place 1 drop into both eyes at bedtime., Disp: , Rfl:    losartan (COZAAR) 25 MG  tablet, Take 2 tablets (50 mg total) by mouth daily., Disp: 90 tablet, Rfl: 3   potassium chloride SA (KLOR-CON M) 10 MEQ tablet, Take 1 tablet (10 mEq total) by mouth daily., Disp: 30 tablet, Rfl: 11   spironolactone (ALDACTONE) 25 MG tablet, Take 1 tablet (25 mg total) by mouth daily., Disp: 90 tablet, Rfl: 3   Blood Pressure Monitoring (BLOOD PRESSURE MONITOR 7) DEVI, Measure blood pressure daily (Patient not taking: Reported on 08/31/2022), Disp: 1 each, Rfl: 0   Insulin Pen Needle 31G X 5 MM MISC, Use as directed in the morning, at noon, in the evening, and at bedtime. (Patient not taking: Reported on 08/30/2022), Disp: 400 each, Rfl: 3 Allergies  Allergen Reactions   Lamictal [Lamotrigine] Itching   Strawberry Extract Hives   Sulfa Antibiotics Hives and Itching      Social History   Socioeconomic History   Marital status: Significant Other    Spouse name: Not on file   Number of children: Not on file   Years of education: Not on file   Highest education level: Not on file  Occupational History   Not on file  Tobacco Use   Smoking status: Former    Packs/day: 0.50    Years: 20.00    Total pack years: 10.00    Types: Cigarettes    Quit date: 07/01/2021    Years since quitting: 1.1   Smokeless tobacco: Never  Vaping Use   Vaping Use: Never used  Substance and Sexual Activity   Alcohol use: No   Drug use: No   Sexual activity: Yes    Birth control/protection: None  Other Topics Concern   Not on file  Social History Narrative   Not on file   Social Determinants of Health   Financial Resource Strain: High Risk (08/02/2022)   Overall Financial Resource Strain (CARDIA)    Difficulty of Paying Living Expenses: Hard  Food Insecurity: Food Insecurity Present (08/02/2022)   Hunger Vital Sign    Worried About Running Out of Food in the Last Year: Often true    Ran Out of Food in the Last Year: Often true  Transportation Needs: Unmet Transportation Needs (04/01/2022)    PRAPARE - Hydrologist (Medical): Yes    Lack of Transportation (Non-Medical): Yes  Physical Activity: Not on file  Stress: Not on file  Social Connections: Not on file  Intimate Partner Violence: Not on file    Physical Exam      Future Appointments  Date Time Provider [place]  09/07/2022 10:20 AM Izora Ribas, MD CPR-PRMA CPR  09/09/2022 11:00 AM MC-HVSC LAB MC-HVSC None  09/24/2022  1:50 PM GI-BCG MM 3 GI-BCGMM GI-BREAST CE  10/26/2022  6:10 PM CVD[facility] DEVICE REMOTES [facility]  11/23/2022 10:00 AM Larey Dresser, MD MC-HVSC None  12/09/2022  9:45 AM Ranell Patrick, Clide Deutscher, MD CPR-PRMA CPR  12/14/2022  2:00 PM Debbe Mounts, PhD [facility]  12/15/2022  9:50 AM Shamleffer, Melanie Crazier, MD LBPC-LBENDO None  01/25/2023  6:10 PM CVD-[facility] DEVICE REMOTES [facility]       Skipper Cliche [phone] [facility] Paramedic  08/31/22

## 2022-08-31 NOTE — Telephone Encounter (Signed)
Patient called back saying her pump and cgm are working and that she has had no lows alarms going off last night or today.  Says FBS today was 174, and it has come down.  Stressed need to do correction doses as needed when blood sugars go high.

## 2022-08-31 NOTE — Progress Notes (Signed)
Patient came in saying she had to remove the sensor and pod due to multiple alarms for lows, and no communication with PDM. She has also had to replace pods due to falling off and one with a bent canula.   When she checked her low blood sugar alarms from Dexcom sensor, with her meter, it was in the 200s.   She removed her pod yesterday  because of lack of sleep, and has been giving Humalog insulin q 4 hours.  New dexcom put in on patient's left arm and she was told to use only the back of the arms for this with pod on opposite arm or abdomen. Her PDM was dead, and we tried charging this, but after 45 minutes, it would not charge.  OmniPod called and replacement will be sent tomorrow.   Patient was told to go home and try charging this with her special PDM charging cord.  We could not put the pod on when PDM is not working. She was told that if the pdm gets charged, to fill pod and attach to areas on her abdomen that new areas that have never been used for injections.  Sites shown to her for use.  She was also given Skin tac to use and told to get this from [facility] and to use IV clear tape over pod to prevent pod from being removed from backing tape on her skin.  Samples given for this and she was told to get these at the pharmacy if they work for her.  She agreed to do this and to let me know how her blood sugars are doing in the AM.

## 2022-08-31 NOTE — Telephone Encounter (Signed)
LVM to call me to let me know if pump alarmed last night or if she had any difficulties with CGM or using the pump.

## 2022-09-07 ENCOUNTER — Encounter: Payer: Self-pay | Admitting: Physical Medicine and Rehabilitation

## 2022-09-07 ENCOUNTER — Encounter
Payer: Commercial Managed Care - HMO | Attending: Physical Medicine and Rehabilitation | Admitting: Physical Medicine and Rehabilitation

## 2022-09-07 ENCOUNTER — Telehealth (HOSPITAL_COMMUNITY): Payer: Self-pay | Admitting: Licensed Clinical Social Worker

## 2022-09-07 VITALS — BP 137/81 | HR 95 | Ht 64.0 in | Wt 215.0 lb

## 2022-09-07 DIAGNOSIS — Z794 Long term (current) use of insulin: Secondary | ICD-10-CM | POA: Diagnosis present

## 2022-09-07 DIAGNOSIS — Z89512 Acquired absence of left leg below knee: Secondary | ICD-10-CM | POA: Insufficient documentation

## 2022-09-07 DIAGNOSIS — E1142 Type 2 diabetes mellitus with diabetic polyneuropathy: Secondary | ICD-10-CM | POA: Insufficient documentation

## 2022-09-07 NOTE — Telephone Encounter (Signed)
H&V Care Navigation CSW Progress Note  Clinical Social Worker received call from pt asking for updated housing list.  CSW completed housing search and sent to pt.  She continues to live with friends while she tries to find more permanent housing options.  Patient is participating in a Managed Medicaid Plan:  Yes  Jessie: Food Insecurity Present (08/02/2022)  Housing: High Risk (09/07/2022)  Transportation Needs: Unmet Transportation Needs (04/01/2022)  Depression (PHQ2-9): Low Risk  (09/07/2022)  Recent Concern: Depression (PHQ2-9) - High Risk (08/10/2022)  Financial Resource Strain: High Risk (08/02/2022)  Tobacco Use: Medium Risk (09/07/2022)    Jorge Ny, LCSW Clinical Social Worker [facility] Desk#: [phone] Cell#: [phone]

## 2022-09-07 NOTE — Patient Instructions (Signed)
saffron

## 2022-09-07 NOTE — Progress Notes (Signed)
Subjective:    Patient ID: Sarah Long, female    DOB: 1971/04/16, 51 y.o.   MRN: [id number]  HPI Sarah Long is a 51 year old woman who presents for follow-up of left BKA, menstrual pain.   1) Type 2 DM -she has an appointment with an endocrinologist -has a dexcom and omnipod to given her 2U insulin every hour -CBGs have been very well controlled.  -she has tingling and numbness in her right foot -she is getting phantom limb pain in left leg.  -she has follow-up with Dr. Sharol Given  and PCP -she is willing to try nerve medications -she has neuropathic pain throughout her right lower extremity -does not require any refills today -she still has neuropathy in her other foot.   2) Constipation: -she does not lie the Miralax -she is worried about being constipated wit the iron.   3) Left BKA -no longer has any phantom limb pain -no pain in the residual limb -tolerating prosthesis well  4) Right lower extremity edema -compression garments ordered by Dr. Sharol Given -she does not like how much the lasix makes her urinate -does not require any refills today -resolved  5) Menarche -returned and can be painful  Pain Inventory Average Pain 6 Pain Right Now 7 My pain is stabbing and aching  In the last 24 hours, has pain interfered with the following? General activity 6 Relation with others 6 Enjoyment of life 7 What TIME of day is your pain at its worst? evening Sleep (in general) Poor  Pain is worse with: inactivity Pain improves with: rest and therapy/exercise Relief from Meds: 6  use a walker how many minutes can you walk? 5 ability to climb steps?  no  disabled: date disabled 06/27/2021  numbness tingling spasms depression anxiety  N/a  N/a    Family History  Problem Relation Age of Onset   Hypertension Mother    Diabetes Mother    Heart disease Mother    Hypertension Father    Diabetes Father    Heart disease Father    Stroke Maternal Grandfather     Other Neg Hx    Social History   Socioeconomic History   Marital status: Significant Other    Spouse name: Not on file   Number of children: Not on file   Years of education: Not on file   Highest education level: Not on file  Occupational History   Not on file  Tobacco Use   Smoking status: Former    Packs/day: 0.50    Years: 20.00    Total pack years: 10.00    Types: Cigarettes    Quit date: 07/01/2021    Years since quitting: 1.1   Smokeless tobacco: Never  Vaping Use   Vaping Use: Never used  Substance and Sexual Activity   Alcohol use: No   Drug use: No   Sexual activity: Yes    Birth control/protection: None  Other Topics Concern   Not on file  Social History Narrative   Not on file   Social Determinants of Health   Financial Resource Strain: High Risk (08/02/2022)   Overall Financial Resource Strain (CARDIA)    Difficulty of Paying Living Expenses: Hard  Food Insecurity: Food Insecurity Present (08/02/2022)   Hunger Vital Sign    Worried About Running Out of Food in the Last Year: Often true    Ran Out of Food in the Last Year: Often true  Transportation Needs: Unmet Transportation Needs (04/01/2022)  PRAPARE - Hydrologist (Medical): Yes    Lack of Transportation (Non-Medical): Yes  Physical Activity: Not on file  Stress: Not on file  Social Connections: Not on file   Past Surgical History:  Procedure Laterality Date   ABDOMINAL AORTOGRAM W/LOWER EXTREMITY N/A 06/17/2021   Procedure: ABDOMINAL AORTOGRAM W/LOWER EXTREMITY;  Surgeon: Wellington Hampshire, MD;  Location: [facility];  Service: Cardiovascular;  Laterality: N/A;   AMPUTATION Left 06/27/2021   Procedure: AMPUTATION BELOW KNEE;  Surgeon: Newt Minion, MD;  Location: [place];  Service: Orthopedics;  Laterality: Left;   AMPUTATION Right 09/04/2021   Procedure: RIGHT GREAT TOE AMPUTATION;  Surgeon: Newt Minion, MD;  Location: [place];  Service: Orthopedics;   Laterality: Right;   CESAREAN SECTION  1991   LEFT HEART CATH AND CORONARY ANGIOGRAPHY N/A 02/10/2020   Procedure: LEFT HEART CATH AND CORONARY ANGIOGRAPHY;  Surgeon: Troy Sine, MD;  Location: [place] [facility];  Service: Cardiovascular;  Laterality: N/A;   PACEMAKER IMPLANT N/A 02/13/2020   Procedure: PACEMAKER IMPLANT;  Surgeon: Constance Haw, MD;  Location: [place] [facility];  Service: Cardiovascular;  Laterality: N/A;   TEMPORARY PACEMAKER N/A 02/10/2020   Procedure: TEMPORARY PACEMAKER;  Surgeon: Troy Sine, MD;  Location: [place] [facility];  Service: Cardiovascular;  Laterality: N/A;   Past Medical History:  Diagnosis Date   Abnormal Pap smear 1998   Abnormal vaginal bleeding 12/19/2011   Acute urinary retention 06/26/2021   Arthritis    Bacterial infection    Bipolar 1 disorder (HCC)    Blister of second toe of left foot 11/21/2016   Candida vaginitis 07/2007   Depression    recently added wellbutrin-has not taken yet for bipolar   Diabetes in pregnancy    Diabetes mellitus    nph 20U qam and qpm, regular with meals   Diabetic ketoacidosis without coma associated with type 2 diabetes mellitus ([place])    Fibroid    Galactorrhea of right breast 2008   GERD (gastroesophageal reflux disease)    H/O amenorrhea 07/2007   H/O dizziness 10/14/2011   H/O dysmenorrhea 2010   H/O menorrhagia 10/14/2011   H/O varicella    Headache(784.0)    Heavy vaginal bleeding due to contraceptive injection use 10/12/2011   Depo Provera   Herpes    HSV-2 infection 01/03/2009   Hx: UTI (urinary tract infection) 2009   Hypertension    on aldomet   Hypoalbuminemia due to protein-calorie malnutrition ([place]) 06/26/2021   Increased BMI 2010   Irregular uterine bleeding 04/04/2012   Pt has mirena    Obesity 10/14/2011   Oligomenorrhea 07/2007   Osteomyelitis of great toe of right foot ([place]) 03/22/2022   Pelvic pain in female    Presence of permanent cardiac pacemaker    Preterm  labor    Trichomonas    Yeast infection    BP 137/81   Pulse 95   Ht '5\' 4"'$  (1.626 m)   Wt 215 lb (97.5 kg)   SpO2 100%   BMI 36.90 kg/m   Opioid Risk Score:   Fall Risk Score:  `1  Depression screen [facility] 2/9     09/07/2022    9:46 AM 08/10/2022    2:50 PM 03/22/2022   10:33 AM 03/02/2022   12:57 PM 11/18/2021   10:59 AM 11/10/2021    9:43 AM 10/07/2021   11:15 AM  Depression screen PHQ 2/9  Decreased  Interest 0 1 1 0 2 0 0  Down, Depressed, Hopeless 0 1 1 0 2 0 1  PHQ - 2 Score 0 2 2 0 4 0 1  Altered sleeping  '3 3  2  1  '$ Tired, decreased energy  '2 2  2  2  '$ Change in appetite  '1 2  1  2  '$ Feeling bad or failure about yourself   0 3  1  0  Trouble concentrating  1 0  0  0  Moving slowly or fidgety/restless  2 0  0  0  Suicidal thoughts  0 0  0  0  PHQ-9 Score  '11 12  10  6      '$ Review of Systems  Constitutional: Negative.   HENT: Negative.    Eyes: Negative.   Respiratory: Negative.    Cardiovascular: Negative.   Gastrointestinal: Negative.   Endocrine: Negative.   Genitourinary: Negative.   Musculoskeletal:        Pain in left leg  Skin: Negative.   Allergic/Immunologic: Negative.   Neurological:  Positive for weakness and numbness.  Hematological: Negative.   Psychiatric/Behavioral:  Positive for dysphoric mood. The patient is nervous/anxious.        Objective:   Physical Exam Gen: no distress, normal appearing, weight 215 lbs, BMI 36.90, BMI 169/108 HEENT: oral mucosa pink and moist, NCAT Cardio: Reg rate Chest: normal effort, normal rate of breathing Abd: soft, non-distended Ext: no edema Psych: pleasant, normal affect Skin: intact Neuro: Alert and oriented x3 Musculoskeletal: Left BKA healed well, ambulating with prosthetic well. Amputated digits of right foot    Assessment & Plan:  Sarah Long is a 51 year old woman who presents for hospital follow-up after CIR admission for left BKA  1) L BKA with phantom limb pain -continue follow-up with Dr.  Sharol Given, healing well D/c gabapentin -d/c qutenza -will complete social security forms, asked that these be resent -discussed her current challenges at work and that she is currently working three hours per day. Discussed her difficulty doing stairs, shortness of breath  2) Type 2 DM with peripheral neuropathy- improved -improved, d/c qutenza.  -will look into medically tailored meals for her.  -Recommended 1 glass water with 1 TB apple cider vinegar before meals to reduce CBG spike, has additional health benefits, drink with straw to protect enamel.  -discussed her difficulty getting food from her family.  Discussed that honey has been shown to help diabetes, but that it does have fructose in it.    2) Anemia -discussed what it means to be anemic -discussed sources of iron rih fods.   3)Constipation:  -Provided list of following foods that help with constipation and highlighted a few: 1) prunes- contain high amounts of fiber.  2) apples- has a form of dietary fiber called pectin that accelerates stool movement and increases beneficial gut bacteria 3) pears- in addition to fiber, also high in fructose and sorbitol which have laxative effect 4) figs- contain an enzyme ficin which helps to speed colonic transit 5) kiwis- contain an enzyme actinidin that improves gut motility and reduces constipation 6) oranges- rich in pectin (like apples) 7) grapefruits- contain a flavanol naringenin which has a laxative effect 8) vegetables- rich in fiber and also great sources of folate, vitamin C, and K 9) artichoke- high in inulin, prebiotic great for the microbiome 10) chicory- increases stool frequency and softness (can be added to coffee) 11) rhubarb- laxative effect 12) sweet potato- high fiber  13) beans, peas, and lentils- contain both soluble and insoluble fiber 14) chia seeds- improves intestinal health and gut flora 15) flaxseeds- laxative effect 16) whole grain rye bread- high in fiber 17)  oat bran- high in soluble and insoluble fiber 18) kefir- softens stools -recommended to try at least one of these foods every day.  -drink 6-8 glasses of water per day -walk regularly, especially after meals.   4) HTN: -BP is 169/108 today.  -continue to eat potassium rich foods.  -Advised checking BP daily at home and logging results to bring into follow-up appointment with PCP and myself. -Reviewed BP meds today.  -Advised regarding healthy foods that can help lower blood pressure and provided with a list: 1) citrus foods- high in vitamins and minerals 2) salmon and other fatty fish - reduces inflammation and oxylipins 3) swiss chard (leafy green)- high level of nitrates 4) pumpkin seeds- one of the best natural sources of magnesium 5) Beans and lentils- high in fiber, magnesium, and potassium 6) Berries- high in flavonoids 7) Amaranth (whole grain, can be cooked similarly to rice and oats)- high in magnesium and fiber 8) Pistachios- even more effective at reducing BP than other nuts 9) Carrots- high in phenolic compounds that relax blood vessels and reduce inflammation 10) Celery- contain phthalides that relax tissues of arterial walls 11) Tomatoes- can also improve cholesterol and reduce risk of heart disease 12) Broccoli- good source of magnesium, calcium, and potassium 13) Greek yogurt: high in potassium and calcium 14) Herbs and spices: Celery seed, cilantro, saffron, lemongrass, black cumin, ginseng, cinnamon, cardamom, sweet basil, and ginger 15) Chia and flax seeds- also help to lower cholesterol and blood sugar 16) Beets- high levels of nitrates that relax blood vessels  17) spinach and bananas- high in potassium  -Provided lise of supplements that can help with hypertension:  1) magnesium: one high quality brand is Bioptemizers since it contains all 7 types of magnesium, otherwise over the counter magnesium gluconate '400mg'$  is a good option 2) B vitamins 3) vitamin D 4)  potassium 5) CoQ10 6) L-arginine 7) Vitamin C 8) Beetroot -Educated that goal BP is 120/80. -Made goal to incorporate some of the above foods into diet.    5) Allergies -recommended bee pollen   6) Obesity: -Educated that current weight is 215 lbs and current BMI is 36.90 -Educated regarding health benefits of weight loss- for pain, general health, chronic disease prevention, immune health, mental health.  -Will monitor weight every visit.  -Consider Ro[place]ois tea daily.  -Discussed the benefits of intermittent fasting. -Discussed foods that can assist in weight loss: 1) leafy greens- high in fiber and nutrients 2) dark chocolate- improves metabolism (if prefer sweetened, best to sweeten with honey instead of sugar).  3) cruciferous vegetables- high in fiber and protein 4) full fat yogurt: high in healthy fat, protein, calcium, and probiotics 5) apples- high in a variety of phytochemicals 6) nuts- high in fiber and protein that increase feelings of fullness 7) grapefruit: rich in nutrients, antioxidants, and fiber (not to be taken with anticoagulation) 8) beans- high in protein and fiber 9) salmon- has high quality protein and healthy fats 10) green tea- rich in polyphenols 11) eggs- rich in choline and vitamin D 12) tuna- high protein, boosts metabolism 13) avocado- decreases visceral abdominal fat 14) chicken (pasture raised): high in protein and iron 15) blueberries- reduce abdominal fat and cholesterol 16) whole grains- decreases calories retained during digestion, speeds metabolism 17) chia seeds- curb appetite  18) chilies- increases fat metabolism  -Discussed supplements that can be used:  1) Metatrim '400mg'$  BID 30 minutes before breakfast and dinner  2) Sphaeranthus indicus and Garcinia mangostana (combinations of these and #1 can be found in capsicum and zychrome  3) green coffee bean extract '400mg'$  twice per day or Irvingia (african mango) 150 to '300mg'$  twice per day.

## 2022-09-09 ENCOUNTER — Ambulatory Visit (HOSPITAL_COMMUNITY)
Admission: RE | Admit: 2022-09-09 | Discharge: 2022-09-09 | Disposition: A | Discharge status: Home / Self Care | Payer: Medicaid Other | Source: Ambulatory Visit | Attending: Internal Medicine | Admitting: Internal Medicine

## 2022-09-09 DIAGNOSIS — I5022 Chronic systolic (congestive) heart failure: Secondary | ICD-10-CM | POA: Diagnosis present

## 2022-09-09 LAB — BASIC METABOLIC PANEL
Anion gap: 7 (ref 5–15)
BUN: 29 mg/dL — ABNORMAL HIGH (ref 6–20)
CO2: 22 mmol/L (ref 22–32)
Calcium: 8.6 mg/dL — ABNORMAL LOW (ref 8.9–10.3)
Chloride: 109 mmol/L (ref 98–111)
Creatinine, Ser: 1.34 mg/dL — ABNORMAL HIGH (ref 0.44–1.00)
GFR, Estimated: 49 mL/min — ABNORMAL LOW (ref >60)
Glucose, Bld: 184 mg/dL — ABNORMAL HIGH (ref 70–99)
Potassium: 5.1 mmol/L (ref 3.5–5.1)
Sodium: 138 mmol/L (ref 135–145)

## 2022-09-14 ENCOUNTER — Encounter: Payer: Self-pay | Admitting: Cardiology

## 2022-09-14 ENCOUNTER — Ambulatory Visit: Payer: Medicaid Other | Attending: Genetic Counselor | Admitting: Genetic Counselor

## 2022-09-15 ENCOUNTER — Telehealth (HOSPITAL_COMMUNITY): Payer: Self-pay | Admitting: Licensed Clinical Social Worker

## 2022-09-15 NOTE — Telephone Encounter (Signed)
HF Paramedicine Team Based Care Meeting  HF MD- NA  HF NP - [place] NP-C   [facility] admit within the last 30 days for heart failure? no  SDOH - has a job so is doing better financially- hopeful to get her own apt soon  Eligible for discharge?  Hopeful for DC soon after she is more stable living situation  Jorge Ny, [facility] Desk#: [phone] Cell#: [phone]

## 2022-09-17 NOTE — Progress Notes (Signed)
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  Test Report Interpretation The GLA c.1078G>C, p.Gly360Arg (O756E) variant has been previously reported in a brother and sister in cis with another GLA variant R356Q that is considered likely benign or seen I some patients with mild Fabry disease.  A [place] study identified another variant at the identical location, namely Gly360Cys (G360C) in 3 males with classic Fabry disease.    This missense variant is not observed in the general population indicating that it is a very rare variant and is in the terminal beta sandwich domain of the protein. Fabry missense mutations in GLA gene are recognized across the entire protein.   Sarah Long underwent Alpha galactosidase enzyme activity that detected reduced activity of 20.1 (normal >35.5nmol/hr/mg). 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  Genetics  I explained to her that Fabry disease results from reduced activity of the Alpha galactosidase enzyme that is encoded by the GLA gene. This gene is located on the X chromosome and hence shows X-linked inheritance.   Mutations in this gene leads to accumulation of the enzyme substrate and its derivatives in different tissues throughout the body. Since this is a X-linked condition, males typically present with more severe symptoms that include severe pain in the extremities, raised red spots on the skin, sweating abnormalities, corneal opacity, gradual deterioration of kidney function leading to  ESRD in the 3rd to 5th decade and later cardiac or cerebrovascular disease.  Females, by virtue of having two X chromosomes, are usually mildly affected with later age of onset than males. Some females may be asymptomatic.  She verbalized understanding.   Personal Medical Information Sarah Long (III.1 on pedigree), a very pleasant 51 year-old woman of African American lady who works as a Chief Strategy Officer, has a left leg prosthetic after being amputated for diabetes. She tells me that had a pacemaker implanted in 2019 for heart block. She reports having symptoms of shortness of breath, fatigue and postural dizziness. Denies having syncope or heart palpitations.  She denies having other symptoms related to Fabry disease and reports having hearing issues in her left ear for the last 2 years.  Family history Sarah Long (III.1) is the only child of her parents. She has four children, two daughters, ages 66 and 46 (IV.1, IV.3) and two sons ages, 82 and 21 (IV.2-IV.4). Does report both sons with having burning sensations in their feet.   Her father (II.1) is one of 18 siblings. She did not grow up with her father and does not know much about her paternal relatives. Her father had end stage renal disease at age 70 and died at 58 from HIV infection resulting from incessant IV drug use.   Her mother (II.2) died at 47 from HIV infection transmitted by her husband. Her maternal uncle (II.3) died in his 21s from diabetes complication. Both maternal grandmothers (I.3, I.4) died at age 66  and 31s from stroke.  Impression and Plan Based on her family history and presentation of ESRD at age 106 in her father, it is highly likely that she inherited the GLA gene mutation  from her father who likely inherited from his mother since this is an X-linked condition. She verbalized understanding of this. Explained to her that her sons and daughters are at a 50% risk of inheriting the familial pathogenic GLA variant. It is recommended  that her children undergo genetic testing for the familial GLA variant to determine their genotype and initiate appropriate management at the earliest. She understands this. Treatment options include enzyme replacement therapy, chaperone therapy to prevent or delay disease progress in the kidneys, heart and other adverse events.  In addition, we discussed the protections afforded by the Genetic Information Non-Discrimination Act (GINA). I explained to her that GINA protects her from losing her employment or health insurance based on her genotype. However, these protections do not cover life insurance and disability.   Please note that the patient has not been counseled in this visit on personal, cultural, or ethical issues that they may face due to their heart condition.    Lattie Corns, Ph.D, [facility] Clinical Molecular Geneticist

## 2022-09-20 ENCOUNTER — Encounter (HOSPITAL_COMMUNITY): Payer: Medicaid Other

## 2022-09-24 ENCOUNTER — Other Ambulatory Visit (HOSPITAL_COMMUNITY): Payer: Self-pay | Admitting: Emergency Medicine

## 2022-09-24 ENCOUNTER — Ambulatory Visit: Payer: Medicaid Other

## 2022-09-24 NOTE — Progress Notes (Signed)
Paramedicine Encounter    Patient ID: Sarah Long, female    DOB: Sep 15, 1971, 50 y.o.   MRN: [id number]   BP 120/80 (BP Location: Right Arm, Patient Position: Sitting, Cuff Size: Normal)   Pulse 72   Resp 16  Weight yesterday-not taken Last visit weight-214lb  ATF Sarah Long A&O x 4, skin W&D w/ good color.  Pt was sitting outside on the phone with someone regarding her finances and living situation.  Pt. Advises that she is going to have to find somewhere else to live as her current arrangement is no longer working out.   Sarah Long has been mostly compliant with her meds.  She is able to fill her own med box without issue.  Unable to get SpO2 reading due to her dark acrylic nails.  She has no complaint of chest pain or SOB.  Lung sounds clear and equal bilat.  No edema noted to her extremity.  Discussed discharging her from the paramedicine program in the next couple of visits.  She told me she was going to have to talk to her children about their living situation and tell them they may be moving again soon.  She advised me that she would keep me apprised of where she moves.  Next visit scheduled for 11/3 @ 3:30.  Home visit complete.    [name], [place] 09/24/2022  Patient Care Team: [name], MD as PCP - General (Pulmonary Disease) Sueanne Margarita, MD as PCP - Cardiology (Cardiology) Constance Haw, MD as PCP - Electrophysiology (Cardiology) Osker Mason, RPH-CPP (Pharmacist) Ethelda Chick as Social Worker  Patient Active Problem List   Diagnosis Date Noted   Peripheral vascular disease, unspecified ([place]) 03/22/2022   Type 2 diabetes mellitus with diabetic polyneuropathy, with long-term current use of insulin ([place]) 02/03/2022   Type 2 diabetes mellitus with hyperglycemia, with long-term current use of insulin ([place]) 02/03/2022   Microalbuminuria 02/03/2022   Homelessness 11/19/2021   Right great toe amputee ([place]) 11/03/2021   Class  2 severe obesity due to excess calories with serious comorbidity and body mass index (BMI) of 36.0 to 36.9 in adult [facility]) 10/15/2021   Insomnia 10/07/2021   Left below-knee amputee ([place]) 07/01/2021   Normocytic anemia 06/26/2021   Heart failure with mid-range ejection fraction ([place]) 06/26/2021   Diabetic nephropathy with proteinuria ([place]) 06/26/2021   Dyspnea    Dental caries 04/01/2020   Cracked tooth 04/01/2020   Alcohol use 03/11/2020   Vitamin D deficiency 03/11/2020   Prolonged QT interval 02/26/2020   Chronic combined systolic and diastolic CHF (congestive heart failure) ([place]) 02/26/2020   Heart block AV complete ([place]) 02/11/2020   Exocrine pancreatic insufficiency    Dysmenorrhea 10/02/2019   DM type 2 with diabetic peripheral neuropathy ([place]) 05/30/2019   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 block) 05/29/2019   Former smoker 11/26/2013   Hypercholesteremia 02/02/2007   Bipolar 1 disorder ([place]) 02/02/2007   HYPERTENSION, BENIGN SYSTEMIC 02/02/2007    Current Outpatient Medications:    acetaminophen (TYLENOL) 325 MG tablet, Take 650 mg by mouth every 6 (six) hours as needed (pain)., Disp: , Rfl:    atorvastatin (LIPITOR) 20 MG tablet, Take 1 tablet (20 mg total) by mouth daily., Disp: 90 tablet, Rfl: 3   carvedilol (COREG) 6.25 MG tablet, Take 1 tablet (6.25 mg total) by mouth 2 (two) times daily with a meal., Disp: 180 tablet, Rfl: 3   Continuous Blood Gluc Sensor (  DEXCOM G6 SENSOR) MISC, 1 Device by Does not apply route as directed., Disp: 9 each, Rfl: 3   Continuous Blood Gluc Transmit (DEXCOM G6 TRANSMITTER) MISC, 1 Device by Does not apply route as directed., Disp: 1 each, Rfl: 3   furosemide (LASIX) 20 MG tablet, Take 2 tablets (40 mg total) by mouth every morning AND 1 tablet (20 mg total) every evening., Disp: 90 tablet, Rfl: 2   Insulin Disposable Pump (OMNIPOD 5 G6 POD, GEN 5,) MISC, 1 Device by Does not apply route every other day., Disp: 9  each, Rfl: 3   insulin lispro (HUMALOG) 100 UNIT/ML injection, Max daily 125 units a day per pump, Disp: 40 mL, Rfl: 11   Insulin Pen Needle 31G X 5 MM MISC, Use as directed in the morning, at noon, in the evening, and at bedtime., Disp: 400 each, Rfl: 3   isosorbide-hydrALAZINE (BIDIL) 20-37.5 MG tablet, Take 0.5 tablets by mouth 3 (three) times daily., Disp: 45 tablet, Rfl: 6   latanoprost (XALATAN) 0.005 % ophthalmic solution, Place 1 drop into both eyes at bedtime., Disp: , Rfl:    losartan (COZAAR) 25 MG tablet, Take 2 tablets (50 mg total) by mouth daily., Disp: 90 tablet, Rfl: 3   potassium chloride SA (KLOR-CON M) 10 MEQ tablet, Take 1 tablet (10 mEq total) by mouth daily., Disp: 30 tablet, Rfl: 11   spironolactone (ALDACTONE) 25 MG tablet, Take 1 tablet (25 mg total) by mouth daily., Disp: 90 tablet, Rfl: 3   Blood Pressure Monitoring (BLOOD PRESSURE MONITOR 7) DEVI, Measure blood pressure daily (Patient not taking: Reported on 09/24/2022), Disp: 1 each, Rfl: 0 Allergies  Allergen Reactions   Lamictal [Lamotrigine] Itching   Strawberry Extract Hives   Sulfa Antibiotics Hives and Itching      Social History   Socioeconomic History   Marital status: Significant Other    Spouse name: Not on file   Number of children: Not on file   Years of education: Not on file   Highest education level: Not on file  Occupational History   Not on file  Tobacco Use   Smoking status: Former    Packs/day: 0.50    Years: 20.00    Total pack years: 10.00    Types: Cigarettes    Quit date: 07/01/2021    Years since quitting: 1.2   Smokeless tobacco: Never  Vaping Use   Vaping Use: Never used  Substance and Sexual Activity   Alcohol use: No   Drug use: No   Sexual activity: Yes    Birth control/protection: None  Other Topics Concern   Not on file  Social History Narrative   Not on file   Social Determinants of Health   Financial Resource Strain: High Risk (08/02/2022)   Overall  Financial Resource Strain (CARDIA)    Difficulty of Paying Living Expenses: Hard  Food Insecurity: Food Insecurity Present (08/02/2022)   Hunger Vital Sign    Worried About Running Out of Food in the Last Year: Often true    Ran Out of Food in the Last Year: Often true  Transportation Needs: Unmet Transportation Needs (04/01/2022)   PRAPARE - Hydrologist (Medical): Yes    Lack of Transportation (Non-Medical): Yes  Physical Activity: Not on file  Stress: Not on file  Social Connections: Not on file  Intimate Partner Violence: Not on file    Physical Exam      Future Appointments  Date Time Provider  [place]  09/28/2022 11:00 AM MC-RESPTX TECH MC-RESPTX None  10/26/2022  6:10 PM CVD-CHURCH DEVICE REMOTES [facility]  11/12/2022  3:10 PM GI-BCG MM 2 GI-BCGMM GI-BREAST CE  11/23/2022 10:00 AM Larey Dresser, MD MC-HVSC None  12/15/2022  9:50 AM Shamleffer, Melanie Crazier, MD LBPC-LBENDO None  01/25/2023  6:10 PM CVD-CHURCH DEVICE REMOTES [facility]  03/14/2023 10:00 AM Raulkar, Clide Deutscher, MD CPR-PRMA CPR       [name], [place] [facility] Paramedic  09/24/22

## 2022-09-27 DIAGNOSIS — H5213 Myopia, bilateral: Secondary | ICD-10-CM | POA: Diagnosis not present

## 2022-09-28 ENCOUNTER — Encounter (HOSPITAL_COMMUNITY): Payer: Medicaid Other

## 2022-09-28 ENCOUNTER — Encounter (HOSPITAL_COMMUNITY): Payer: Self-pay | Admitting: Emergency Medicine

## 2022-09-28 ENCOUNTER — Ambulatory Visit (HOSPITAL_COMMUNITY)
Admission: EM | Admit: 2022-09-28 | Discharge: 2022-09-28 | Disposition: A | Discharge status: Home / Self Care | Payer: Medicaid Other | Attending: Emergency Medicine | Admitting: Emergency Medicine

## 2022-09-28 DIAGNOSIS — K0889 Other specified disorders of teeth and supporting structures: Secondary | ICD-10-CM

## 2022-09-28 MED ORDER — CLOTRIMAZOLE 1 % VA CREA: 1.0000 | TOPICAL_CREAM | Freq: Every day | VAGINAL | 0 refills | Status: AC

## 2022-09-28 MED ORDER — AMOXICILLIN-POT CLAVULANATE 875-125 MG PO TABS: 1.0000 | ORAL_TABLET | Freq: Two times a day (BID) | ORAL | 0 refills | Status: AC

## 2022-09-28 NOTE — ED Triage Notes (Signed)
Pt c/o right lower dental pain for 2-3 months. Unable to get in with dentist. Taking tylenol and oral gel.

## 2022-09-28 NOTE — Discharge Instructions (Addendum)
Take Augmentin twice daily for seven days. Take probiotic with your antibiotic.  OPTIONS FOR DENTAL FOLLOW UP CARE  [place] [facility] and [place] [email or web address] [phone])  [facility] [phone])  [place] [phone])  [place] [phone])  [facility] [phone]) This clinic caters to the indigent population and is on a lottery system. Location: [employer] [facility], [place], [place], [place] Clinic Hours: Wednesdays from 6pm - 9pm, patients seen by a lottery system. For dates, call or go to [email or web address] Services: Cleanings, fillings and simple extractions. Payment Options: DENTAL WORK IS FREE OF CHARGE. Bring proof of income or support. Best way to get seen: Arrive at 5:15 pm - this is a lottery, NOT first come/first serve, so arriving earlier will not increase your chances of being seen.     [facility] [phone] Select option 1 for emergencies   Location: [facility], [place], [address] [place] Clinic Hours: No walk-ins accepted - call the day before to schedule an appointment. Check in times are 9:30 am and 1:30 pm. Services: Simple extractions, temporary fillings, pulpectomy/pulp debridement, uncomplicated abscess drainage. Payment Options: PAYMENT IS DUE AT THE TIME OF SERVICE.  Fee is usually $100-200, additional surgical procedures (e.g. abscess drainage) may be extra. Cash, checks, Visa/MasterCard accepted.  Can file Medicaid if patient is covered for dental - patient should call case worker to check. No discount for [facility] patients. Best way to get seen: MUST call the day before and get onto the schedule. Can usually be seen the next 1-2 days.  No walk-ins accepted.     [place] [phone]   Location: [place], [place] [facility] Hours: M, W, Th, F 8am or 1:30pm, Tues 9a or 1:30 - first come/first served. Services: Simple extractions, temporary fillings, uncomplicated abscess drainage.  You do not need to be an [facility] resident. Payment Options: PAYMENT IS DUE AT THE TIME OF SERVICE. Dental insurance, otherwise sliding scale - bring proof of income or support. Depending on income and treatment needed, cost is usually $50-200. Best way to get seen: Arrive early as it is first come/first served.     [facility] [phone]   Location: [place] [facility] Hours: Mon-Thu 8a-5p Services: Most basic dental services including extractions and fillings. Payment Options: PAYMENT IS DUE AT THE TIME OF SERVICE. Sliding scale, up to 50% off - bring proof if income or support. Medicaid with dental option accepted. Best way to get seen: Call to schedule an appointment, can usually be seen within 2 weeks OR they will try to see walk-ins - show up at [place] or 2p (you may have to wait).     [facility] [place] RESIDENTS ONLY   Location: [facility] [place] [address] [place], [place] [address] Clinic Hours: By appointment only. Monday - Thursday 8am-5pm, Friday 8am-12pm Services: Cleanings, fillings, extractions. Payment Options: PAYMENT IS DUE AT THE TIME OF SERVICE. Cash, Visa or MasterCard. Sliding scale - $30 minimum per service. Best way to get seen: Come in to office, complete packet and make an appointment - need proof of income or support monies for each household member and proof of [facility] residence. Usually takes about a month to get in.  [facility] [phone]   Location: [address], [facility]  Hours: Walk-in Urgent Care Dental Services are offered Monday-Friday mornings only. The numbers of emergencies accepted daily is limited to the number of providers available. Maximum 15 - Mondays, Wednesdays & Thursdays Maximum 10 - Tuesdays & Fridays Services: You do not need to be a [facility] resident to be seen for a dental emergency. Emergencies are defined as pain, swelling, abnormal bleeding, or dental trauma. Walkins will receive x-rays if needed. NOTE: Dental cleaning is not an emergency. Payment Options: PAYMENT IS DUE AT THE TIME OF SERVICE. Minimum co-pay is $40.00 for uninsured patients. Minimum co-pay is $3.00 for Medicaid with dental coverage. Dental Insurance is accepted and must be presented at time of visit. Medicare does not cover dental. Forms of payment: Cash, credit card, checks. Best way to get seen: If not previously registered with the clinic, walk-in dental registration begins at 7:15 am and is on a first come/first serve basis. If previously registered with the clinic, call to make an appointment.     [facility] [place] ONLY   Location: [address] [place], [place] Clinic Hours: Mon-Thu 10a-2p Services: Extractions only! Payment Options: FREE (donations accepted) - bring proof of income or support Best way to get seen: Call and schedule an appointment OR come at 8am on the 1st Monday of every month (except for holidays) when it is first come/first served.     [facility] [phone]   Location: [place], [place] Clinic Hours: Friday mornings Services, Payment Options, Best way to get seen: Call for info

## 2022-09-28 NOTE — ED Provider Notes (Signed)
Provider Note  Patient Contact: 10:50 AM (approximate)   History   Dental Pain   HPI  Sarah Long is a 51 y.o. female presents to the urgent care with concern for dental pain.  Patient has a broken inferior 31.  Patient states that she has reached out to a local dentist but it would be several weeks before she can get in.  She has had no pain underneath the tongue or difficulty swallowing.  She states that she has been taking Tylenol and has been trying home remedies such as vanilla extract with little relief.      Physical Exam   Triage Vital Signs: ED Triage Vitals  Enc Vitals Group     BP 09/28/22 1010 137/85     Pulse Rate 09/28/22 1010 90     Resp 09/28/22 1010 17     Temp 09/28/22 1010 98.1 F (36.7 C)     Temp Source 09/28/22 1010 Oral     SpO2 09/28/22 1010 99 %     Weight --      Height --      Head Circumference --      Peak Flow --      Pain Score 09/28/22 1009 10     Pain Loc --      Pain Edu? --      Excl. in [place]? --     Most recent vital signs: Vitals:   09/28/22 1010  BP: 137/85  Pulse: 90  Resp: 17  Temp: 98.1 F (36.7 C)  SpO2: 99%     General: Alert and in no acute distress. Eyes:  PERRL. EOMI. Head: No acute traumatic findings ENT:      Nose: No congestion/rhinnorhea.      Mouth/Throat: Mucous membranes are moist.  Patient has broken inferior 31. Neck: No stridor. No cervical spine tenderness to palpation. Cardiovascular:  Good peripheral perfusion Respiratory: Normal respiratory effort without tachypnea or retractions. Lungs CTAB. Good air entry to the bases with no decreased or absent breath sounds. Gastrointestinal: Bowel sounds 4 quadrants. Soft and nontender to palpation. No guarding or rigidity. No palpable masses. No distention. No CVA tenderness. Musculoskeletal: Full range of motion to all extremities.  Neurologic:  No gross focal neurologic deficits are appreciated.  Skin:   No rash noted    ED Results / Procedures  / Treatments   Labs (all labs ordered are listed, but only abnormal results are displayed) Labs Reviewed - No data to display     PROCEDURES:  Critical Care performed: No  Procedures   MEDICATIONS ORDERED IN ED: Medications - No data to display   IMPRESSION / MDM / [place] / ED COURSE  I reviewed the triage vital signs and the nursing notes.                              Assessment and plan Dental pain 51 year old female presents to the urgent care with a broken inferior 31.  Vital signs were reassuring at triage.  On exam, patient was alert, active and nontoxic-appearing with no pain underneath the tongue or difficulty swallowing.  She was managing her own secretions.  We will start patient on Augmentin.  Patient requested medication for a yeast infection I see that patient has a history of QT prolongation so will use clotrimazole topically instead of Diflucan.  Return precautions were given to return with new or worsening symptoms.  All patient questions were answered.      FINAL CLINICAL IMPRESSION(S) / ED DIAGNOSES   Final diagnoses:  Pain, dental     Rx / DC Orders   ED Discharge Orders          Ordered    amoxicillin-clavulanate (AUGMENTIN) 875-125 MG tablet  2 times daily        09/28/22 1043    clotrimazole (GYNE-LOTRIMIN) 1 % vaginal cream  Daily at bedtime        09/28/22 1049             Note:  This document was prepared using Dragon voice recognition software and may include unintentional dictation errors.   Vallarie Mare [place], [place] 09/28/22 1052

## 2022-09-30 ENCOUNTER — Telehealth (HOSPITAL_COMMUNITY): Payer: Self-pay | Admitting: Licensed Clinical Social Worker

## 2022-09-30 NOTE — Telephone Encounter (Signed)
H&V Care Navigation CSW Progress Note  Clinical Social Worker Received call from pt stating she is still struggling with housing.  Pt was living with family but had to leave.  Now living with adult dtr who is in a one bedroom apartment- so very small for them all to stay there.  Pt working and has a Printmaker but hasn't found anywhere to go yet with voucher.  Going to apply today for housing opportunity that does not do background check (pt has bad credit)- will let CSW know how this goes and CSW will attempt to assist with housing search if needed.  Patient is participating in a Managed Medicaid Plan:  Yes  [place]: Food Insecurity Present (08/02/2022)  Housing: High Risk (09/07/2022)  Transportation Needs: Unmet Transportation Needs (04/01/2022)  Depression (PHQ2-9): Low Risk  (09/07/2022)  Recent Concern: Depression (PHQ2-9) - High Risk (08/10/2022)  Financial Resource Strain: High Risk (08/02/2022)  Tobacco Use: Medium Risk (09/28/2022)   Jorge Ny, LCSW Clinical Social Worker [facility] Desk#: [phone] Cell#: [phone]

## 2022-10-05 ENCOUNTER — Other Ambulatory Visit (HOSPITAL_COMMUNITY): Payer: Self-pay | Admitting: Emergency Medicine

## 2022-10-05 NOTE — Progress Notes (Signed)
Paramedicine Encounter    Patient ID: Sarah Long, female    DOB: 08-Sep-1971, 51 y.o.   MRN: [id number]   LMP 09/06/2022  Weight yesterday-not taken Last visit weight-no scale  Refills losartan, potassium 46mq  Patient Care Team: WElsie Stain MD as PCP - General (Pulmonary Disease) TSueanne Margarita MD as PCP - Cardiology (Cardiology) CConstance Haw MD as PCP - Electrophysiology (Cardiology) HOsker Mason RPH-CPP (Pharmacist) SEthelda Chickas Social Worker  Patient Active Problem List   Diagnosis Date Noted  . Peripheral vascular disease, unspecified ([place] 03/22/2022  . Type 2 diabetes mellitus with diabetic polyneuropathy, with long-term current use of insulin ([place] 02/03/2022  . Type 2 diabetes mellitus with hyperglycemia, with long-term current use of insulin (HF[place] 02/03/2022  . Microalbuminuria 02/03/2022  . Homelessness 11/19/2021  . Right great toe amputee ([place] 11/03/2021  . Class 2 severe obesity due to excess calories with serious comorbidity and body mass index (BMI) of 36.0 to 36.9 in adult ([facility] 10/15/2021  . Insomnia 10/07/2021  . Left below-knee amputee (HI[place] 07/01/2021  . Normocytic anemia 06/26/2021  . Heart failure with mid-range ejection fraction ([place] 06/26/2021  . Diabetic nephropathy with proteinuria ([place] 06/26/2021  . Dyspnea   . Dental caries 04/01/2020  . Cracked tooth 04/01/2020  . Alcohol use 03/11/2020  . Vitamin D deficiency 03/11/2020  . Prolonged QT interval 02/26/2020  . Chronic combined systolic and diastolic CHF (congestive heart failure) (HCh[place] 02/26/2020  . Heart block AV complete (H[place] 02/11/2020  . Exocrine pancreatic insufficiency   . Dysmenorrhea 10/02/2019  . DM type 2 with diabetic peripheral neuropathy (HL[place] 05/30/2019  . 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 block) 05/29/2019  . Former smoker 11/26/2013  . Hypercholesteremia 02/02/2007  . Bipolar 1 disorder ([place] 02/02/2007  .  HYPERTENSION, BENIGN SYSTEMIC 02/02/2007    Current Outpatient Medications:  .  acetaminophen (TYLENOL) 325 MG tablet, Take 650 mg by mouth every 6 (six) hours as needed (pain)., Disp: , Rfl:  .  amoxicillin-clavulanate (AUGMENTIN) 875-125 MG tablet, Take 1 tablet by mouth 2 (two) times daily for 10 days., Disp: 20 tablet, Rfl: 0 .  atorvastatin (LIPITOR) 20 MG tablet, Take 1 tablet (20 mg total) by mouth daily., Disp: 90 tablet, Rfl: 3 .  Blood Pressure Monitoring (BLOOD PRESSURE MONITOR 7) DEVI, Measure blood pressure daily (Patient not taking: Reported on 09/24/2022), Disp: 1 each, Rfl: 0 .  carvedilol (COREG) 6.25 MG tablet, Take 1 tablet (6.25 mg total) by mouth 2 (two) times daily with a meal., Disp: 180 tablet, Rfl: 3 .  clotrimazole (GYNE-LOTRIMIN) 1 % vaginal cream, Place 1 Applicatorful vaginally at bedtime for 7 days., Disp: 45 g, Rfl: 0 .  Continuous Blood Gluc Sensor (DEXCOM G6 SENSOR) MISC, 1 Device by Does not apply route as directed., Disp: 9 each, Rfl: 3 .  Continuous Blood Gluc Transmit (DEXCOM G6 TRANSMITTER) MISC, 1 Device by Does not apply route as directed., Disp: 1 each, Rfl: 3 .  furosemide (LASIX) 20 MG tablet, Take 2 tablets (40 mg total) by mouth every morning AND 1 tablet (20 mg total) every evening., Disp: 90 tablet, Rfl: 2 .  Insulin Disposable Pump (OMNIPOD 5 G6 POD, GEN 5,) MISC, 1 Device by Does not apply route every other day., Disp: 9 each, Rfl: 3 .  insulin lispro (HUMALOG) 100 UNIT/ML injection, Max daily 125 units a day per pump, Disp: 40 mL, Rfl: 11 .  Insulin Pen Needle 31G X  5 MM MISC, Use as directed in the morning, at noon, in the evening, and at bedtime., Disp: 400 each, Rfl: 3 .  isosorbide-hydrALAZINE (BIDIL) 20-37.5 MG tablet, Take 0.5 tablets by mouth 3 (three) times daily., Disp: 45 tablet, Rfl: 6 .  latanoprost (XALATAN) 0.005 % ophthalmic solution, Place 1 drop into both eyes at bedtime., Disp: , Rfl:  .  losartan (COZAAR) 25 MG tablet, Take 2  tablets (50 mg total) by mouth daily., Disp: 90 tablet, Rfl: 3 .  potassium chloride SA (KLOR-CON M) 10 MEQ tablet, Take 1 tablet (10 mEq total) by mouth daily., Disp: 30 tablet, Rfl: 11 .  spironolactone (ALDACTONE) 25 MG tablet, Take 1 tablet (25 mg total) by mouth daily., Disp: 90 tablet, Rfl: 3 Allergies  Allergen Reactions  . Lamictal [Lamotrigine] Itching  . Strawberry Extract Hives  . Sulfa Antibiotics Hives and Itching      Social History   Socioeconomic History  . Marital status: Significant Other    Spouse name: Not on file  . Number of children: Not on file  . Years of education: Not on file  . Highest education level: Not on file  Occupational History  . Not on file  Tobacco Use  . Smoking status: Former    Packs/day: 0.50    Years: 20.00    Total pack years: 10.00    Types: Cigarettes    Quit date: 07/01/2021    Years since quitting: 1.2  . Smokeless tobacco: Never  Vaping Use  . Vaping Use: Never used  Substance and Sexual Activity  . Alcohol use: No  . Drug use: No  . Sexual activity: Yes    Birth control/protection: None  Other Topics Concern  . Not on file  Social History Narrative  . Not on file   Social Determinants of Health   Financial Resource Strain: High Risk (08/02/2022)   Overall Financial Resource Strain (CARDIA)   . Difficulty of Paying Living Expenses: Hard  Food Insecurity: Food Insecurity Present (08/02/2022)   Hunger Vital Sign   . Worried About Charity fundraiser in the Last Year: Often true   . Ran Out of Food in the Last Year: Often true  Transportation Needs: Unmet Transportation Needs (04/01/2022)   PRAPARE - Transportation   . Lack of Transportation (Medical): Yes   . Lack of Transportation (Non-Medical): Yes  Physical Activity: Not on file  Stress: Not on file  Social Connections: Not on file  Intimate Partner Violence: Not on file    Physical Exam      Future Appointments  Date Time Provider [place]   10/26/2022  6:10 PM CVD-[facility] DEVICE REMOTES [facility]  11/12/2022  3:10 PM GI-BCG MM 2 GI-BCGMM GI-BREAST CE  11/23/2022 10:00 AM Larey Dresser, MD MC-HVSC None  12/15/2022  9:50 AM Shamleffer, Melanie Crazier, MD LBPC-LBENDO None  01/25/2023  6:10 PM CVD-CHURCH DEVICE REMOTES [facility]  03/14/2023 10:00 AM Raulkar, Clide Deutscher, MD CPR-PRMA CPR       [name], [place] [facility] Paramedic  10/05/22

## 2022-10-19 ENCOUNTER — Other Ambulatory Visit (HOSPITAL_COMMUNITY): Payer: Self-pay | Admitting: Emergency Medicine

## 2022-10-19 NOTE — Progress Notes (Signed)
Paramedicine Encounter    Patient ID: Sarah Long, female    DOB: 1971/09/02, 51 y.o.   MRN: [id number]   LMP 09/06/2022  Weight yesterday-not taken  Last visit weight-not taken  Had not taken today's meds yet.  Reconciled for 2 weeks.  Patient Care Team: [name], MD as PCP - General (Pulmonary Disease) Sueanne Margarita, MD as PCP - Cardiology (Cardiology) Constance Haw, MD as PCP - Electrophysiology (Cardiology) Osker Mason, RPH-CPP (Pharmacist) Ethelda Chick as Social Worker  Patient Active Problem List   Diagnosis Date Noted  . Peripheral vascular disease, unspecified ([place]) 03/22/2022  . Type 2 diabetes mellitus with diabetic polyneuropathy, with long-term current use of insulin ([place]) 02/03/2022  . Type 2 diabetes mellitus with hyperglycemia, with long-term current use of insulin ([place]) 02/03/2022  . Microalbuminuria 02/03/2022  . Homelessness 11/19/2021  . Right great toe amputee ([place]) 11/03/2021  . Class 2 severe obesity due to excess calories with serious comorbidity and body mass index (BMI) of 36.0 to 36.9 in adult [facility]) 10/15/2021  . Insomnia 10/07/2021  . Left below-knee amputee ([place]) 07/01/2021  . Normocytic anemia 06/26/2021  . Heart failure with mid-range ejection fraction ([place]) 06/26/2021  . Diabetic nephropathy with proteinuria ([place]) 06/26/2021  . Dyspnea   . Dental caries 04/01/2020  . Cracked tooth 04/01/2020  . Alcohol use 03/11/2020  . Vitamin D deficiency 03/11/2020  . Prolonged QT interval 02/26/2020  . Chronic combined systolic and diastolic CHF (congestive heart failure) ([place]) 02/26/2020  . Heart block AV complete ([place]) 02/11/2020  . Exocrine pancreatic insufficiency   . Dysmenorrhea 10/02/2019  . DM type 2 with diabetic peripheral neuropathy ([place]) 05/30/2019  . 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 block) 05/29/2019  . Former smoker 11/26/2013  . Hypercholesteremia 02/02/2007  . Bipolar 1  disorder ([place]) 02/02/2007  . HYPERTENSION, BENIGN SYSTEMIC 02/02/2007    Current Outpatient Medications:  .  acetaminophen (TYLENOL) 325 MG tablet, Take 650 mg by mouth every 6 (six) hours as needed (pain)., Disp: , Rfl:  .  atorvastatin (LIPITOR) 20 MG tablet, Take 1 tablet (20 mg total) by mouth daily., Disp: 90 tablet, Rfl: 3 .  Blood Pressure Monitoring (BLOOD PRESSURE MONITOR 7) DEVI, Measure blood pressure daily (Patient not taking: Reported on 09/24/2022), Disp: 1 each, Rfl: 0 .  carvedilol (COREG) 6.25 MG tablet, Take 1 tablet (6.25 mg total) by mouth 2 (two) times daily with a meal., Disp: 180 tablet, Rfl: 3 .  Continuous Blood Gluc Sensor (DEXCOM G6 SENSOR) MISC, 1 Device by Does not apply route as directed., Disp: 9 each, Rfl: 3 .  Continuous Blood Gluc Transmit (DEXCOM G6 TRANSMITTER) MISC, 1 Device by Does not apply route as directed., Disp: 1 each, Rfl: 3 .  furosemide (LASIX) 20 MG tablet, Take 2 tablets (40 mg total) by mouth every morning AND 1 tablet (20 mg total) every evening., Disp: 90 tablet, Rfl: 2 .  Insulin Disposable Pump (OMNIPOD 5 G6 POD, GEN 5,) MISC, 1 Device by Does not apply route every other day., Disp: 9 each, Rfl: 3 .  insulin lispro (HUMALOG) 100 UNIT/ML injection, Max daily 125 units a day per pump, Disp: 40 mL, Rfl: 11 .  Insulin Pen Needle 31G X 5 MM MISC, Use as directed in the morning, at noon, in the evening, and at bedtime., Disp: 400 each, Rfl: 3 .  isosorbide-hydrALAZINE (BIDIL) 20-37.5 MG tablet, Take 0.5 tablets by mouth 3 (three) times daily.,  Disp: 45 tablet, Rfl: 6 .  latanoprost (XALATAN) 0.005 % ophthalmic solution, Place 1 drop into both eyes at bedtime., Disp: , Rfl:  .  losartan (COZAAR) 25 MG tablet, Take 2 tablets (50 mg total) by mouth daily., Disp: 90 tablet, Rfl: 3 .  potassium chloride SA (KLOR-CON M) 10 MEQ tablet, Take 1 tablet (10 mEq total) by mouth daily., Disp: 30 tablet, Rfl: 11 .  spironolactone (ALDACTONE) 25 MG tablet, Take 1  tablet (25 mg total) by mouth daily., Disp: 90 tablet, Rfl: 3 Allergies  Allergen Reactions  . Lamictal [Lamotrigine] Itching  . Strawberry Extract Hives  . Sulfa Antibiotics Hives and Itching      Social History   Socioeconomic History  . Marital status: Significant Other    Spouse name: Not on file  . Number of children: Not on file  . Years of education: Not on file  . Highest education level: Not on file  Occupational History  . Not on file  Tobacco Use  . Smoking status: Former    Packs/day: 0.50    Years: 20.00    Total pack years: 10.00    Types: Cigarettes    Quit date: 07/01/2021    Years since quitting: 1.3  . Smokeless tobacco: Never  Vaping Use  . Vaping Use: Never used  Substance and Sexual Activity  . Alcohol use: No  . Drug use: No  . Sexual activity: Yes    Birth control/protection: None  Other Topics Concern  . Not on file  Social History Narrative  . Not on file   Social Determinants of Health   Financial Resource Strain: High Risk (08/02/2022)   Overall Financial Resource Strain (CARDIA)   . Difficulty of Paying Living Expenses: Hard  Food Insecurity: Food Insecurity Present (08/02/2022)   Hunger Vital Sign   . Worried About Charity fundraiser in the Last Year: Often true   . Ran Out of Food in the Last Year: Often true  Transportation Needs: Unmet Transportation Needs (04/01/2022)   PRAPARE - Transportation   . Lack of Transportation (Medical): Yes   . Lack of Transportation (Non-Medical): Yes  Physical Activity: Not on file  Stress: Not on file  Social Connections: Not on file  Intimate Partner Violence: Not on file    Physical Exam      Future Appointments  Date Time Provider [place]  10/26/2022  6:10 PM [facility] DEVICE REMOTES [facility]  11/12/2022  3:10 PM GI-BCG MM 2 GI-BCGMM GI-BREAST CE  11/23/2022 10:00 AM Larey Dresser, MD MC-HVSC None  12/15/2022  9:50 AM Shamleffer, Melanie Crazier, MD  LBPC-LBENDO None  01/25/2023  6:10 PM [facility] DEVICE REMOTES [facility]  03/14/2023 10:00 AM Raulkar, Clide Deutscher, MD CPR-PRMA CPR       [name], [facility] Paramedic  10/19/22

## 2022-10-26 ENCOUNTER — Other Ambulatory Visit: Payer: Self-pay

## 2022-10-26 ENCOUNTER — Ambulatory Visit (INDEPENDENT_AMBULATORY_CARE_PROVIDER_SITE_OTHER): Payer: Commercial Managed Care - HMO

## 2022-10-26 DIAGNOSIS — Z950 Presence of cardiac pacemaker: Secondary | ICD-10-CM | POA: Diagnosis not present

## 2022-10-26 DIAGNOSIS — I442 Atrioventricular block, complete: Secondary | ICD-10-CM

## 2022-10-26 MED ORDER — DEXCOM G6 SENSOR MISC: 1.0000 | 3 refills | Status: DC

## 2022-10-26 MED ORDER — DEXCOM G6 TRANSMITTER MISC: 1.0000 | 3 refills | Status: DC

## 2022-10-27 LAB — CUP PACEART REMOTE DEVICE CHECK
Battery Remaining Longevity: 84 mo
Battery Remaining Percentage: 73 %
Battery Voltage: 2.99 V
Brady Statistic AP VP Percent: 1 %
Brady Statistic AP VS Percent: 1 %
Brady Statistic AS VP Percent: 99 %
Brady Statistic AS VS Percent: 1 %
Brady Statistic RA Percent Paced: 1 %
Brady Statistic RV Percent Paced: 99 %
Date Time Interrogation Session: 20231121205758
Implantable Lead Connection Status: 753985
Implantable Lead Connection Status: 753985
Implantable Lead Implant Date: 20210310
Implantable Lead Implant Date: 20210310
Implantable Lead Location: 753859
Implantable Lead Location: 753860
Implantable Pulse Generator Implant Date: 20210310
Lead Channel Impedance Value: 410 Ohm
Lead Channel Impedance Value: 450 Ohm
Lead Channel Pacing Threshold Amplitude: 0.625 V
Lead Channel Pacing Threshold Amplitude: 0.75 V
Lead Channel Pacing Threshold Pulse Width: 0.5 ms
Lead Channel Pacing Threshold Pulse Width: 0.5 ms
Lead Channel Sensing Intrinsic Amplitude: 11.7 mV
Lead Channel Sensing Intrinsic Amplitude: 5 mV
Lead Channel Setting Pacing Amplitude: 1 V
Lead Channel Setting Pacing Amplitude: 1.625
Lead Channel Setting Pacing Pulse Width: 0.5 ms
Lead Channel Setting Sensing Sensitivity: 2 mV
Pulse Gen Model: 2272
Pulse Gen Serial Number: 3802095

## 2022-11-01 ENCOUNTER — Telehealth (HOSPITAL_COMMUNITY): Payer: Self-pay | Admitting: Licensed Clinical Social Worker

## 2022-11-01 NOTE — Telephone Encounter (Signed)
H&V Care Navigation CSW Progress Note  Clinical Social Worker contacted patient by phone to discuss patient housing search.  Pt states she has been approved for apt but that it is unclear when it will become available- per apt site manager will be ready Dec or January.    Pt reports that she is struggling to get basics because of paying for motel room for her and her children- provided $100 in gift cards to help with expenses.  Pt very motivated to get into apartment and has been working hard to do so- has multiple resources lined up to help with expenses.  No further needs at this time will keep CSW informed of housing.  Patient is participating in a Managed Medicaid Plan:  Yes  [place]: Food Insecurity Present (11/01/2022)  Housing: High Risk (09/07/2022)  Transportation Needs: Unmet Transportation Needs (04/01/2022)  Depression (PHQ2-9): Low Risk  (09/07/2022)  Recent Concern: Depression (PHQ2-9) - High Risk (08/10/2022)  Financial Resource Strain: High Risk (11/01/2022)  Tobacco Use: Medium Risk (09/28/2022)   Jorge Ny, LCSW Clinical Social Worker [facility] Desk#: [phone] Cell#: [phone]

## 2022-11-02 ENCOUNTER — Telehealth (HOSPITAL_COMMUNITY): Payer: Self-pay | Admitting: Emergency Medicine

## 2022-11-02 NOTE — Telephone Encounter (Signed)
Called and LVM w/ Ms. Speas attempting to schedule a visit tomorrow at 2:30 or 3:00.  Requested her to call back to advise if she was available.    [name], [place] 11/02/2022

## 2022-11-09 ENCOUNTER — Other Ambulatory Visit (HOSPITAL_COMMUNITY): Payer: Self-pay | Admitting: Emergency Medicine

## 2022-11-09 NOTE — Progress Notes (Signed)
Paramedicine Encounter    Patient ID: Sarah Long, female    DOB: 29-Aug-1971, 51 y.o.   MRN: [id number]   BP (!) 160/88 (BP Location: Left Arm, Patient Position: Sitting, Cuff Size: Normal)   Pulse 80   Resp 16   Wt 215 lb (97.5 kg)   BMI 36.90 kg/m  Weight yesterday-not taken Last visit weight- 214lb  Sarah Long sitting in her [employer]i outside the extended stay hotel where she is currently residing.  She is on the 3rd floor and there is no elevator.  She was able to climb the stairs only stopping once near the end to catch her breath.  She advised she is in the process of getting  a house and says she is hoping to be in by Christmas.  She is currently having issues with her medicaid and has been calling to get things clarified.  She seems to have a good grasp on what needs to happen but she says "the wheels are just moving slow."  She has not been very compliant with her meds due to "having so much going on."    Med box reconciled x 1 week.  Refills called in Furosemide, Losartan and Potassium to [facility]. Pharmacist advises that until she gets the kinks worked out with her insurance these scripts can't be filled.  She is aware of same and advises she will continue to call her contacts today.   Pt. Denies chest pain or SOB.  No edema noted.  Lung sounds clear and equal bilat.   Reminded her of appointment on 12/19 w/ Dr. Aundra Dubin @ 10:00.   Home visit complete.    [name], [facility] 11/09/2022    Patient Care Team: [name], MD as PCP - General (Pulmonary Disease) Sueanne Margarita, MD as PCP - Cardiology (Cardiology) Constance Haw, MD as PCP - Electrophysiology (Cardiology) Osker Mason, RPH-CPP (Pharmacist) Ethelda Chick as Social Worker  Patient Active Problem List   Diagnosis Date Noted   Peripheral vascular disease, unspecified ([place]) 03/22/2022   Type 2 diabetes mellitus with diabetic polyneuropathy, with long-term current use of  insulin ([place]) 02/03/2022   Type 2 diabetes mellitus with hyperglycemia, with long-term current use of insulin ([place]) 02/03/2022   Microalbuminuria 02/03/2022   Homelessness 11/19/2021   Right great toe amputee ([place]) 11/03/2021   Class 2 severe obesity due to excess calories with serious comorbidity and body mass index (BMI) of 36.0 to 36.9 in adult [facility]) 10/15/2021   Insomnia 10/07/2021   Left below-knee amputee ([place]) 07/01/2021   Normocytic anemia 06/26/2021   Heart failure with mid-range ejection fraction ([place]) 06/26/2021   Diabetic nephropathy with proteinuria ([place]) 06/26/2021   Dyspnea    Dental caries 04/01/2020   Cracked tooth 04/01/2020   Alcohol use 03/11/2020   Vitamin D deficiency 03/11/2020   Prolonged QT interval 02/26/2020   Chronic combined systolic and diastolic CHF (congestive heart failure) ([place]) 02/26/2020   Heart block AV complete ([place]) 02/11/2020   Exocrine pancreatic insufficiency    Dysmenorrhea 10/02/2019   DM type 2 with diabetic peripheral neuropathy ([place]) 05/30/2019   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 block) 05/29/2019   Former smoker 11/26/2013   Hypercholesteremia 02/02/2007   Bipolar 1 disorder ([place]) 02/02/2007   HYPERTENSION, BENIGN SYSTEMIC 02/02/2007    Current Outpatient Medications:    acetaminophen (TYLENOL) 325 MG tablet, Take 650 mg by mouth every 6 (six) hours as needed (pain)., Disp: ,  Rfl:    atorvastatin (LIPITOR) 20 MG tablet, Take 1 tablet (20 mg total) by mouth daily., Disp: 90 tablet, Rfl: 3   carvedilol (COREG) 6.25 MG tablet, Take 1 tablet (6.25 mg total) by mouth 2 (two) times daily with a meal., Disp: 180 tablet, Rfl: 3   Continuous Blood Gluc Sensor (DEXCOM G6 SENSOR) MISC, 1 Device by Does not apply route as directed., Disp: 9 each, Rfl: 3   Continuous Blood Gluc Transmit (DEXCOM G6 TRANSMITTER) MISC, 1 Device by Does not apply route as directed., Disp: 1 each, Rfl: 3   furosemide (LASIX) 20 MG tablet,  Take 2 tablets (40 mg total) by mouth every morning AND 1 tablet (20 mg total) every evening., Disp: 90 tablet, Rfl: 2   Insulin Disposable Pump (OMNIPOD 5 G6 POD, GEN 5,) MISC, 1 Device by Does not apply route every other day., Disp: 9 each, Rfl: 3   insulin lispro (HUMALOG) 100 UNIT/ML injection, Max daily 125 units a day per pump, Disp: 40 mL, Rfl: 11   Insulin Pen Needle 31G X 5 MM MISC, Use as directed in the morning, at noon, in the evening, and at bedtime., Disp: 400 each, Rfl: 3   isosorbide-hydrALAZINE (BIDIL) 20-37.5 MG tablet, Take 0.5 tablets by mouth 3 (three) times daily., Disp: 45 tablet, Rfl: 6   losartan (COZAAR) 25 MG tablet, Take 2 tablets (50 mg total) by mouth daily., Disp: 90 tablet, Rfl: 3   potassium chloride SA (KLOR-CON M) 10 MEQ tablet, Take 1 tablet (10 mEq total) by mouth daily., Disp: 30 tablet, Rfl: 11   spironolactone (ALDACTONE) 25 MG tablet, Take 1 tablet (25 mg total) by mouth daily., Disp: 90 tablet, Rfl: 3   Blood Pressure Monitoring (BLOOD PRESSURE MONITOR 7) DEVI, Measure blood pressure daily (Patient not taking: Reported on 09/24/2022), Disp: 1 each, Rfl: 0   latanoprost (XALATAN) 0.005 % ophthalmic solution, Place 1 drop into both eyes at bedtime. (Patient not taking: Reported on 10/19/2022), Disp: , Rfl:  Allergies  Allergen Reactions   Lamictal [Lamotrigine] Itching   Strawberry Extract Hives   Sulfa Antibiotics Hives and Itching      Social History   Socioeconomic History   Marital status: Significant Other    Spouse name: Not on file   Number of children: Not on file   Years of education: Not on file   Highest education level: Not on file  Occupational History   Not on file  Tobacco Use   Smoking status: Former    Packs/day: 0.50    Years: 20.00    Total pack years: 10.00    Types: Cigarettes    Quit date: 07/01/2021    Years since quitting: 1.3   Smokeless tobacco: Never  Vaping Use   Vaping Use: Never used  Substance and Sexual  Activity   Alcohol use: No   Drug use: No   Sexual activity: Yes    Birth control/protection: None  Other Topics Concern   Not on file  Social History Narrative   Not on file   Social Determinants of Health   Financial Resource Strain: High Risk (11/01/2022)   Overall Financial Resource Strain (CARDIA)    Difficulty of Paying Living Expenses: Very hard  Food Insecurity: Food Insecurity Present (11/01/2022)   Hunger Vital Sign    Worried About Running Out of Food in the Last Year: Often true    Ran Out of Food in the Last Year: Often true  Transportation Needs: Unmet Transportation  Needs (04/01/2022)   PRAPARE - Hydrologist (Medical): Yes    Lack of Transportation (Non-Medical): Yes  Physical Activity: Not on file  Stress: Not on file  Social Connections: Not on file  Intimate Partner Violence: Not on file    Physical Exam      Future Appointments  Date Time Provider [place]  11/12/2022  3:10 PM GI-BCG MM 2 GI-BCGMM GI-BREAST CE  11/23/2022 10:00 AM Larey Dresser, MD MC-HVSC None  12/15/2022  9:50 AM Shamleffer, Melanie Crazier, MD LBPC-LBENDO None  01/25/2023  6:10 PM [facility] DEVICE REMOTES [facility]  03/14/2023 10:00 AM Raulkar, Clide Deutscher, MD CPR-PRMA CPR       [name], EMT-P-Paramedic [place] Paramedic  11/09/22

## 2022-11-11 ENCOUNTER — Ambulatory Visit: Payer: Medicaid Other | Attending: Physician Assistant | Admitting: Physician Assistant

## 2022-11-11 ENCOUNTER — Ambulatory Visit: Payer: Self-pay

## 2022-11-11 ENCOUNTER — Encounter: Payer: Self-pay | Admitting: Physician Assistant

## 2022-11-11 VITALS — BP 145/80 | Ht 64.0 in | Wt 215.0 lb

## 2022-11-11 DIAGNOSIS — T8789 Other complications of amputation stump: Secondary | ICD-10-CM

## 2022-11-11 DIAGNOSIS — M79609 Pain in unspecified limb: Secondary | ICD-10-CM | POA: Diagnosis not present

## 2022-11-11 DIAGNOSIS — Z794 Long term (current) use of insulin: Secondary | ICD-10-CM | POA: Diagnosis not present

## 2022-11-11 DIAGNOSIS — E1165 Type 2 diabetes mellitus with hyperglycemia: Secondary | ICD-10-CM | POA: Diagnosis not present

## 2022-11-11 LAB — GLUCOSE, POCT (MANUAL RESULT ENTRY): POC Glucose: 198 mg/dl — AB (ref 70–99)

## 2022-11-11 MED ORDER — DICLOFENAC SODIUM 1 % EX GEL: 2.0000 g | Freq: Four times a day (QID) | CUTANEOUS | 1 refills | Status: DC

## 2022-11-11 MED ORDER — TRAMADOL HCL 50 MG PO TABS: 50.0000 mg | ORAL_TABLET | Freq: Two times a day (BID) | ORAL | 0 refills | Status: AC | PRN

## 2022-11-11 NOTE — Patient Instructions (Signed)
Phantom Limb Pain Phantom limb pain is pain in a body part that no longer exists. It usually happens in an arm or leg after it has been surgically removed (amputated). Most cases of phantom limb pain are brief. However, it can last for years, and it may be severe and disabling. The exact mechanism of how phantom limb pain occurs is not known. The problem may start in a part of the brain that processes feelings and awareness (sensations) from the rest of the body (sensory cortex). When a body part is lost, the sensory cortex may not be able to handle the loss and may reorganize (rewire) itself to make up for the lost signals. What are the causes? This condition only happens in patients with amputations, but the cause is not known. It may be caused by: Damaged nerve endings. Scar tissue. Rewiring of nerves in the brain or spine. What are the signs or symptoms? Symptoms vary and are related to the lost limb or the remaining stump. Stump pain may be mistaken for phantom limb pain, or you may have both at the same time. Symptoms include: Phantom pain. Pain often feels like the pain you had before the amputation. It usually comes and goes, and it gets better over time. The pain may feel like: Burning. Stabbing. Throbbing. Cramping. Prickling. Crushing. Telescoping. This is when the pain moves over time from the farthest part of the amputated limb (fingers or toes) up to the site of amputation, as if the limb is shrinking. Phantom sensation. This is a feeling other than pain, as if the limb is still part of the body. Physical or emotional factors can trigger or worsen pain sensations. Those factors may include: Weather changes. Stress. Strong emotions. Certain positions or movements of the body. Pressure on the affected area. How is this diagnosed? Diagnosis is based on your history of amputation and symptoms that you have after surgery. Your health care provider may: Do a physical exam. Talk  with you about your symptoms and past history of pain. You may have imaging tests to examine your stump, such as X-rays or CT scan. How is this treated? There are different therapies and medicines that may give you relief. Treatment options may include: Pain medicine. Medicine can be given for pain right after surgery (acute pain) and for pain that goes on for some time (chronic pain). Commonly used medicines include: Antidepressant medicine. Anticonvulsant medicine. Narcotics, analgesics, or anti-inflammatory medicine. Nerve blocks. Techniques that help to retrain the brain and nervous system (movement representation techniques), such as: Looking at your unaffected limb in a mirror and thinking about painless movement of your extremity (mirror therapy). Thinking about moving your limbs without actual movement (motor therapy). Watching and sensing the movement of other people (action observation). Attaching a myoelectric sensor on the stump to detect the muscle potential and predict the motion the person wants to perform (virtual reality). Sensory discrimination training. For this treatment, painless stimulation is applied to different parts of your stump and you describe what you feel. This may help with nerve rewiring. Physical therapy involving the stump, which may include: Exercise. This may be physical movement, or it may involve applying sound waves (ultrasound) or tapping (percussion therapy) to the stump. These exercises may help to heal and retrain tissue and nerves. Massage. Stump massage creates new sensations, breaks up scar tissue, and prepares the stump for an artificial limb (prosthesis). Heat or cold treatment. This can improve blood flow and reduce inflammation. Applying painless electrical   pulses to the skin to prevent sensations of pain from reaching the brain (transcutaneous electrical nerve stimulation, TENS). Complementary therapies, such as: Acupuncture. Relaxation  techniques. These often involve hypnosis, guided imagery, deep breathing, and muscle relaxation exercises. Biofeedback. This involves using monitors that alert you to changes in your breathing, heart rate, skin temperature, or muscle activity, and using relaxation techniques to reverse those changes. Biofeedback tells you if the techniques you are using are effective. Follow these instructions at home:  Take over-the-counter and prescription medicines only as told by your health care provider. Ask your health care provider if the medicine prescribed to you requires you to avoid driving or using machinery. Do not use any products that contain nicotine or tobacco. These products include cigarettes, chewing tobacco, and vaping devices, such as e-cigarettes. If you need help quitting, ask your health care provider. Join a support group. Express your feelings and talk with someone you trust. Seek counseling or talk therapy with a mental health professional. This may be helpful if you are having trouble managing your emotions about the amputation. Keep all follow-up visits. This is important. Contact a health care provider if: You have a sore on your stump that does not get better with treatment. Your pain does not improve with medicine or treatment. Get help right away if: You have suicidal thoughts. If you ever feel like you may hurt yourself or others, or have thoughts about taking your own life, get help right away. Go to your nearest emergency department or: Call your local emergency services (911 in the [place] Call a suicide crisis helpline, such as the National Suicide Prevention Lifeline at 1-800-273-8255 or 988 in the [place] This is open 24 hours a day in the [place] Text the Crisis Text Line at 741741 (in the [place] Summary Phantom limb pain is pain in a body part that no longer exists. It happens in an arm or leg (extremity) after it has been surgically removed (amputated). Medicines or  techniques that help to retrain the brain and nervous system (movement representation techniques) may help to relieve symptoms. Physical therapy for phantom limb pain may involve exercise, massage, heat or cold therapy, or painless stimulation of the skin (transcutaneous electrical nerve stimulation, TENS). This information is not intended to replace advice given to you by your health care provider. Make sure you discuss any questions you have with your health care provider. Document Revised: 06/17/2021 Document Reviewed: 04/09/2021 Elsevier Patient Education  2023 Elsevier Inc.  

## 2022-11-11 NOTE — Telephone Encounter (Signed)
Where prothesis is walking stairs   Chief Complaint: severe pain to left leg stump Symptoms: severe pain only Frequency: Monday Pertinent Negatives: Patient denies redness, bruising Disposition: '[]'$ ED /'[]'$ Urgent Care (no appt availability in office) / '[]'$ Appointment(In office/virtual)/ '[]'$  [place] [facility]/ '[]'$ Home Care/ '[]'$ Refused Recommended Disposition /'[x]'$ [place] [facility]/ '[]'$  Follow-up with PCP Additional Notes:   Reason for Disposition  [1] SEVERE pain (e.g., excruciating, unable to do any normal activities) AND [2] not improved after 2 hours of pain medicine  Answer Assessment - Initial Assessment Questions 1. ONSET: "When did the pain start?"      Monday 2. LOCATION: "Where is the pain located?"      End of stump left  3. PAIN: "How bad is the pain?"    (Scale 1-10; or mild, moderate, severe)   -  MILD (1-3): doesn't interfere with normal activities    -  MODERATE (4-7): interferes with normal activities (e.g., work or school) or awakens from sleep, limping    -  SEVERE (8-10): excruciating pain, unable to do any normal activities, unable to walk     severe 4. WORK OR EXERCISE: "Has there been any recent work or exercise that involved this part of the body?"      Climbing stairs 5. CAUSE: "What do you think is causing the leg pain?"     Pressure from prosthesis 6. OTHER SYMPTOMS: "Do you have any other symptoms?" (e.g., chest pain, back pain, breathing difficulty, swelling, rash, fever, numbness, weakness)     no 7. PREGNANCY: "Is there any chance you are pregnant?" "When was your last menstrual period?"     N/a  Protocols used: Leg Pain-A-AH

## 2022-11-11 NOTE — Progress Notes (Signed)
Patient ID: Sarah Long, female   DOB: Nov 19, 1971, 51 y.o.   MRN: [id number]   Sarah Long, is a 51 y.o. female  [id number]:[id number]  [id number]  DOB - 05-27-1971  Chief Complaint  Patient presents with   Leg Pain    Left Leg pain- On amputation  [facility] -Endo Lbtehal Shamleffer       Subjective:   Sarah Long is a 51 y.o. female here today for BKA stump pain.  She has been staying in a hotel for 2 weeks and having to go up and down stairs and caring for her 4 children.  She is wearing her prosthetic and tries to bear most of her weight on her other leg when she is climbing stairs but it has become very sore.  Ibuprofen prescription strength not really helping.  No fever or sign of infection.    Says she saw her endocrinology about 3 months ago and A1C was "8 something"  No problems updated.  ALLERGIES: Allergies  Allergen Reactions   Lamictal [Lamotrigine] Itching   Strawberry Extract Hives   Sulfa Antibiotics Hives and Itching    PAST MEDICAL HISTORY: Past Medical History:  Diagnosis Date   Abnormal Pap smear 1998   Abnormal vaginal bleeding 12/19/2011   Acute urinary retention 06/26/2021   Arthritis    Bacterial infection    Bipolar 1 disorder (HCC)    Blister of second toe of left foot 11/21/2016   Candida vaginitis 07/2007   Depression    recently added wellbutrin-has not taken yet for bipolar   Diabetes in pregnancy    Diabetes mellitus    nph 20U qam and qpm, regular with meals   Diabetic ketoacidosis without coma associated with type 2 diabetes mellitus ([place])    Fibroid    Galactorrhea of right breast 2008   GERD (gastroesophageal reflux disease)    H/O amenorrhea 07/2007   H/O dizziness 10/14/2011   H/O dysmenorrhea 2010   H/O menorrhagia 10/14/2011   H/O varicella    Headache(784.0)    Heavy vaginal bleeding due to contraceptive injection use 10/12/2011   Depo Provera   Herpes    HSV-2 infection 01/03/2009   Hx: UTI (urinary tract  infection) 2009   Hypertension    on aldomet   Hypoalbuminemia due to protein-calorie malnutrition ([place]) 06/26/2021   Increased BMI 2010   Irregular uterine bleeding 04/04/2012   Pt has mirena    Obesity 10/14/2011   Oligomenorrhea 07/2007   Osteomyelitis of great toe of right foot ([place]) 03/22/2022   Pelvic pain in female    Presence of permanent cardiac pacemaker    Preterm labor    Trichomonas    Yeast infection     MEDICATIONS AT HOME: Prior to Admission medications   Medication Sig Start Date End Date Taking? Authorizing Provider  acetaminophen (TYLENOL) 325 MG tablet Take 650 mg by mouth every 6 (six) hours as needed (pain).   Yes [provider]  atorvastatin (LIPITOR) 20 MG tablet Take 1 tablet (20 mg total) by mouth daily. 05/06/22  Yes Larey Dresser, MD  Blood Pressure Monitoring (BLOOD PRESSURE MONITOR 7) DEVI Measure blood pressure daily 03/31/22  Yes [name], MD  carvedilol (COREG) 6.25 MG tablet Take 1 tablet (6.25 mg total) by mouth 2 (two) times daily with a meal. 05/06/22  Yes Larey Dresser, MD  Continuous Blood Gluc Sensor (DEXCOM G6 SENSOR) MISC 1 Device by Does not apply route as directed. 10/26/22  Yes  Shamleffer, Melanie Crazier, MD  Continuous Blood Gluc Transmit (DEXCOM G6 TRANSMITTER) MISC 1 Device by Does not apply route as directed. 10/26/22  Yes Shamleffer, Melanie Crazier, MD  diclofenac Sodium (VOLTAREN ARTHRITIS PAIN) 1 % GEL Apply 2 g topically 4 (four) times daily. 11/11/22  Yes Freeman Caldron M, PA-C  furosemide (LASIX) 20 MG tablet Take 2 tablets (40 mg total) by mouth every morning AND 1 tablet (20 mg total) every evening. 08/30/22  Yes Milford, Maricela Bo, FNP  Insulin Disposable Pump (OMNIPOD 5 G6 POD, GEN 5,) MISC 1 Device by Does not apply route every other day. 06/14/22  Yes Shamleffer, Melanie Crazier, MD  insulin lispro (HUMALOG) 100 UNIT/ML injection Max daily 125 units a day per pump 08/30/22  Yes Shamleffer, Melanie Crazier, MD   Insulin Pen Needle 31G X 5 MM MISC Use as directed in the morning, at noon, in the evening, and at bedtime. 11/02/21  Yes Shamleffer, Melanie Crazier, MD  isosorbide-hydrALAZINE (BIDIL) 20-37.5 MG tablet Take 0.5 tablets by mouth 3 (three) times daily. 08/30/22  Yes Milford, Maricela Bo, FNP  latanoprost (XALATAN) 0.005 % ophthalmic solution Place 1 drop into both eyes at bedtime.   Yes [provider]  losartan (COZAAR) 25 MG tablet Take 2 tablets (50 mg total) by mouth daily. 05/06/22  Yes Larey Dresser, MD  potassium chloride SA (KLOR-CON M) 10 MEQ tablet Take 1 tablet (10 mEq total) by mouth daily. 08/30/22  Yes Milford, Maricela Bo, FNP  spironolactone (ALDACTONE) 25 MG tablet Take 1 tablet (25 mg total) by mouth daily. 07/29/22  Yes Larey Dresser, MD  traMADol (ULTRAM) 50 MG tablet Take 1 tablet (50 mg total) by mouth every 12 (twelve) hours as needed for up to 5 days. Use sparingly 11/11/22 11/16/22 Yes Rodriques Badie M, PA-C    ROS: Neg HEENT Neg resp Neg cardiac Neg GI Neg GU Neg psych Neg neuro  Objective:   Vitals:   11/11/22 1552  BP: (!) 145/80  Weight: 215 lb (97.5 kg)  Height: '5\' 4"'$  (1.626 m)   Exam General appearance : Awake, alert, not in any distress. Speech is pressured. Clear. Not toxic looking HEENT: Atraumatic and Normocephalic, pupils equally reactive to light and accomodation Neck: Supple, no JVD. No cervical lymphadenopathy.  Chest: Good air entry bilaterally, CTAB.  No rales/rhonchi/wheezing CVS: S1 S2 regular, no murmurs.  Extremities: R Lower Ext shows no edema, both legs are warm to touch.  L BKA is well healed stump, cool to touch.  No ulcerations or sores.  No erythema.  No open skin.  Good ROM at knee Neurology: Awake alert, and oriented X 3, CN II-XII intact, Non focal Skin: No Rash  Data Review Lab Results  Component Value Date   HGBA1C 8.5 (A) 06/14/2022   HGBA1C 8.7 (H) 04/21/2022   HGBA1C 7.9 (A) 02/03/2022    Assessment & Plan    1. Type 2 diabetes mellitus with hyperglycemia, with long-term current use of insulin (HCC) Uncontrolled but supposed to be getting pump through endocrinology.  Last A1C in Epic about 5 months ago and=8.5 - Glucose (CBG)  2. Stump pain (HCC) No sign of infection.   - diclofenac Sodium (VOLTAREN ARTHRITIS PAIN) 1 % GEL; Apply 2 g topically 4 (four) times daily.  Dispense: 100 g; Refill: 1 - traMADol (ULTRAM) 50 MG tablet; Take 1 tablet (50 mg total) by mouth every 12 (twelve) hours as needed for up to 5 days. Use sparingly  Dispense: 10 tablet;  Refill: 0    Return in about 2 months (around 01/12/2023) for DR Joya Gaskins for chronic conditions.  The patient was given clear instructions to go to ER or return to medical center if symptoms don't improve, worsen or new problems develop. The patient verbalized understanding. The patient was told to call to get lab results if they haven't heard anything in the next week.      Freeman Caldron, PA-C [facility] and [facility], [place]   11/11/2022, 4:14 PM

## 2022-11-12 ENCOUNTER — Ambulatory Visit
Admission: RE | Admit: 2022-11-12 | Discharge: 2022-11-12 | Disposition: A | Discharge status: Home / Self Care | Payer: Medicaid Other | Source: Ambulatory Visit | Attending: Critical Care Medicine | Admitting: Critical Care Medicine

## 2022-11-12 DIAGNOSIS — Z1231 Encounter for screening mammogram for malignant neoplasm of breast: Secondary | ICD-10-CM

## 2022-11-15 ENCOUNTER — Other Ambulatory Visit (HOSPITAL_COMMUNITY): Payer: Self-pay

## 2022-11-15 ENCOUNTER — Other Ambulatory Visit (HOSPITAL_COMMUNITY): Payer: Self-pay | Admitting: *Deleted

## 2022-11-15 ENCOUNTER — Telehealth (HOSPITAL_COMMUNITY): Payer: Self-pay

## 2022-11-15 MED ORDER — HYDRALAZINE HCL 25 MG PO TABS: 25.0000 mg | ORAL_TABLET | Freq: Three times a day (TID) | ORAL | 3 refills | Status: DC

## 2022-11-15 MED ORDER — ISOSORBIDE MONONITRATE 20 MG PO TABS: 20.0000 mg | ORAL_TABLET | Freq: Every day | ORAL | 3 refills | Status: DC

## 2022-11-15 NOTE — Telephone Encounter (Signed)
Heart Failure Patient Advocate Encounter  Received Prior Authorization request for Bid[place] from [place] and [employer]. Review of this patient coverage indicates that this plan will cover isosorbide and hydralazine as separate prescriptions, but would need charted documentation as to why combination tablet would be required in order for PA to be approved.  PA not submitted at this time, will see if new rxs can be sent to pharmacy.  Clista Bernhardt, CPhT Rx Patient Advocate Phone: [phone]

## 2022-11-15 NOTE — Telephone Encounter (Signed)
Advanced Heart Failure Patient Advocate Encounter  New prescriptions sent to [facility], test claims return $0 copay on both medications. Left message on patient voicemail to inform her of the change in therapy.

## 2022-11-19 NOTE — Progress Notes (Signed)
Remote pacemaker transmission.   

## 2022-11-23 ENCOUNTER — Ambulatory Visit (HOSPITAL_BASED_OUTPATIENT_CLINIC_OR_DEPARTMENT_OTHER)
Admission: RE | Admit: 2022-11-23 | Discharge: 2022-11-23 | Disposition: A | Discharge status: Home / Self Care | Payer: Commercial Managed Care - HMO | Source: Ambulatory Visit | Attending: Cardiology | Admitting: Cardiology

## 2022-11-23 ENCOUNTER — Telehealth (HOSPITAL_COMMUNITY): Payer: Self-pay | Admitting: Emergency Medicine

## 2022-11-23 ENCOUNTER — Other Ambulatory Visit (HOSPITAL_COMMUNITY): Payer: Self-pay

## 2022-11-23 ENCOUNTER — Other Ambulatory Visit (HOSPITAL_COMMUNITY): Payer: Self-pay | Admitting: Emergency Medicine

## 2022-11-23 ENCOUNTER — Other Ambulatory Visit: Payer: Self-pay

## 2022-11-23 ENCOUNTER — Telehealth (HOSPITAL_COMMUNITY): Payer: Self-pay

## 2022-11-23 ENCOUNTER — Encounter (HOSPITAL_COMMUNITY): Payer: Self-pay

## 2022-11-23 ENCOUNTER — Telehealth: Payer: Self-pay

## 2022-11-23 ENCOUNTER — Emergency Department (HOSPITAL_BASED_OUTPATIENT_CLINIC_OR_DEPARTMENT_OTHER): Payer: Commercial Managed Care - HMO | Admitting: Radiology

## 2022-11-23 ENCOUNTER — Emergency Department (HOSPITAL_BASED_OUTPATIENT_CLINIC_OR_DEPARTMENT_OTHER)
Admission: EM | Admit: 2022-11-23 | Discharge: 2022-11-23 | Disposition: A | Discharge status: Home / Self Care | Payer: Commercial Managed Care - HMO | Attending: Emergency Medicine | Admitting: Emergency Medicine

## 2022-11-23 ENCOUNTER — Encounter (HOSPITAL_BASED_OUTPATIENT_CLINIC_OR_DEPARTMENT_OTHER): Payer: Self-pay | Admitting: Emergency Medicine

## 2022-11-23 VITALS — BP 110/70 | HR 89 | Wt 211.0 lb

## 2022-11-23 DIAGNOSIS — I130 Hypertensive heart and chronic kidney disease with heart failure and stage 1 through stage 4 chronic kidney disease, or unspecified chronic kidney disease: Secondary | ICD-10-CM | POA: Insufficient documentation

## 2022-11-23 DIAGNOSIS — F319 Bipolar disorder, unspecified: Secondary | ICD-10-CM | POA: Insufficient documentation

## 2022-11-23 DIAGNOSIS — I5022 Chronic systolic (congestive) heart failure: Secondary | ICD-10-CM

## 2022-11-23 DIAGNOSIS — E875 Hyperkalemia: Secondary | ICD-10-CM | POA: Diagnosis not present

## 2022-11-23 DIAGNOSIS — Z79899 Other long term (current) drug therapy: Secondary | ICD-10-CM | POA: Insufficient documentation

## 2022-11-23 DIAGNOSIS — F17210 Nicotine dependence, cigarettes, uncomplicated: Secondary | ICD-10-CM | POA: Insufficient documentation

## 2022-11-23 DIAGNOSIS — Z89512 Acquired absence of left leg below knee: Secondary | ICD-10-CM | POA: Insufficient documentation

## 2022-11-23 DIAGNOSIS — Z6836 Body mass index (BMI) 36.0-36.9, adult: Secondary | ICD-10-CM | POA: Insufficient documentation

## 2022-11-23 DIAGNOSIS — I442 Atrioventricular block, complete: Secondary | ICD-10-CM | POA: Insufficient documentation

## 2022-11-23 DIAGNOSIS — E7521 Fabry (-Anderson) disease: Secondary | ICD-10-CM | POA: Diagnosis not present

## 2022-11-23 DIAGNOSIS — E1122 Type 2 diabetes mellitus with diabetic chronic kidney disease: Secondary | ICD-10-CM | POA: Insufficient documentation

## 2022-11-23 DIAGNOSIS — Z794 Long term (current) use of insulin: Secondary | ICD-10-CM | POA: Insufficient documentation

## 2022-11-23 DIAGNOSIS — Z791 Long term (current) use of non-steroidal anti-inflammatories (NSAID): Secondary | ICD-10-CM | POA: Insufficient documentation

## 2022-11-23 DIAGNOSIS — I110 Hypertensive heart disease with heart failure: Secondary | ICD-10-CM | POA: Insufficient documentation

## 2022-11-23 DIAGNOSIS — Z8719 Personal history of other diseases of the digestive system: Secondary | ICD-10-CM | POA: Insufficient documentation

## 2022-11-23 DIAGNOSIS — E119 Type 2 diabetes mellitus without complications: Secondary | ICD-10-CM | POA: Insufficient documentation

## 2022-11-23 DIAGNOSIS — Z8249 Family history of ischemic heart disease and other diseases of the circulatory system: Secondary | ICD-10-CM | POA: Insufficient documentation

## 2022-11-23 DIAGNOSIS — N183 Chronic kidney disease, stage 3 unspecified: Secondary | ICD-10-CM | POA: Insufficient documentation

## 2022-11-23 DIAGNOSIS — R7989 Other specified abnormal findings of blood chemistry: Secondary | ICD-10-CM | POA: Diagnosis present

## 2022-11-23 DIAGNOSIS — N179 Acute kidney failure, unspecified: Secondary | ICD-10-CM | POA: Diagnosis not present

## 2022-11-23 DIAGNOSIS — E669 Obesity, unspecified: Secondary | ICD-10-CM | POA: Insufficient documentation

## 2022-11-23 DIAGNOSIS — Z950 Presence of cardiac pacemaker: Secondary | ICD-10-CM | POA: Insufficient documentation

## 2022-11-23 DIAGNOSIS — I428 Other cardiomyopathies: Secondary | ICD-10-CM | POA: Insufficient documentation

## 2022-11-23 DIAGNOSIS — I5042 Chronic combined systolic (congestive) and diastolic (congestive) heart failure: Secondary | ICD-10-CM

## 2022-11-23 DIAGNOSIS — I509 Heart failure, unspecified: Secondary | ICD-10-CM | POA: Insufficient documentation

## 2022-11-23 LAB — BASIC METABOLIC PANEL
Anion gap: 6 (ref 5–15)
Anion gap: 8 (ref 5–15)
BUN: 36 mg/dL — ABNORMAL HIGH (ref 6–20)
BUN: 38 mg/dL — ABNORMAL HIGH (ref 6–20)
CO2: 16 mmol/L — ABNORMAL LOW (ref 22–32)
CO2: 17 mmol/L — ABNORMAL LOW (ref 22–32)
Calcium: 8.9 mg/dL (ref 8.9–10.3)
Calcium: 9.7 mg/dL (ref 8.9–10.3)
Chloride: 111 mmol/L (ref 98–111)
Chloride: 108 mmol/L (ref 98–111)
Creatinine, Ser: 1.6 mg/dL — ABNORMAL HIGH (ref 0.44–1.00)
Creatinine, Ser: 1.57 mg/dL — ABNORMAL HIGH (ref 0.44–1.00)
GFR, Estimated: 39 mL/min — ABNORMAL LOW (ref >60)
GFR, Estimated: 40 mL/min — ABNORMAL LOW (ref >60)
Glucose, Bld: 338 mg/dL — ABNORMAL HIGH (ref 70–99)
Glucose, Bld: 164 mg/dL — ABNORMAL HIGH (ref 70–99)
Potassium: 6.5 mmol/L (ref 3.5–5.1)
Potassium: 5.9 mmol/L — ABNORMAL HIGH (ref 3.5–5.1)
Sodium: 133 mmol/L — ABNORMAL LOW (ref 135–145)
Sodium: 133 mmol/L — ABNORMAL LOW (ref 135–145)

## 2022-11-23 LAB — PREGNANCY, URINE: Preg Test, Ur: NEGATIVE

## 2022-11-23 LAB — CBC
HCT: 33.9 % — ABNORMAL LOW (ref 36.0–46.0)
Hemoglobin: 10.9 g/dL — ABNORMAL LOW (ref 12.0–15.0)
MCH: 29.3 pg (ref 26.0–34.0)
MCHC: 32.2 g/dL (ref 30.0–36.0)
MCV: 91.1 fL (ref 80.0–100.0)
Platelets: 198 10*3/uL (ref 150–400)
RBC: 3.72 MIL/uL — ABNORMAL LOW (ref 3.87–5.11)
RDW: 15 % (ref 11.5–15.5)
WBC: 8 10*3/uL (ref 4.0–10.5)
nRBC: 0 % (ref 0.0–0.2)

## 2022-11-23 LAB — BRAIN NATRIURETIC PEPTIDE: B Natriuretic Peptide: 164.2 pg/mL — ABNORMAL HIGH (ref 0.0–100.0)

## 2022-11-23 MED ORDER — SODIUM ZIRCONIUM CYCLOSILICATE 10 G PO PACK
10.0000 g | PACK | Freq: Once | ORAL | Status: AC
  Administered 2022-11-23: 10 g via ORAL
  Filled 2022-11-23: qty 1

## 2022-11-23 MED ORDER — VARENICLINE TARTRATE (STARTER) 0.5 MG X 11 & 1 MG X 42 PO TBPK: ORAL_TABLET | ORAL | 0 refills | Status: DC

## 2022-11-23 NOTE — Telephone Encounter (Signed)
Followed up with Ena Dawley to see if she had made it to the hospital yet and she advised that she had not.  She states she is trying to get possession of the keys to her new place and after that she would go.  I emphasized to her that she needs get to the hospital ASAP and she assured me would do so.    [name], [place] 11/23/2022

## 2022-11-23 NOTE — Discharge Instructions (Addendum)
You were seen in the emergency department for your high potassium level.  Your repeat potassium level here was still higher than normal but not as high as it was on your outpatient labs.  You had no signs of any complications from your potassium level being high.  We have given you a one-time medication that will help you filter out the potassium and make sure that you do not take your potassium supplements, spironolactone or losartan unless you are cleared by your cardiologist to restart these.  You should follow-up with your primary doctor or your cardiologist to have repeat labs done within the next couple of days.  You should return to the emergency department if you are having chest pain, heart palpitations, repetitive vomiting, you pass out or if you have any other new or concerning symptoms.

## 2022-11-23 NOTE — Progress Notes (Signed)
Height:     Weight: BMI:  Today's Date:  STOP BANG RISK ASSESSMENT S (snore) Have you been told that you snore?     YES   T (tired) Are you often tired, fatigued, or sleepy during the day?   YES  O (obstruction) Do you stop breathing, choke, or gasp during sleep? YES   P (pressure) Do you have or are you being treated for high blood pressure? YES   B (BMI) Is your body index greater than 35 kg/m? YES   A (age) Are you 49 years old or older? NO   N (neck) Do you have a neck circumference greater than 16 inches?   NO   G (gender) Are you a female? NO   TOTAL STOP/BANG "YES" ANSWERS 5                                                                       For Office Use Only              Procedure Order Form    YES to 3+ Stop Bang questions OR two clinical symptoms - patient qualifies for WatchPAT (CPT 95800)      Clinical Notes: Will consult Sleep Specialist and refer for management of therapy due to patient increased risk of Sleep Apnea. Ordering a sleep study due to the following two clinical symptoms: Excessive daytime sleepiness G47.10 / Gastroesophageal reflux K21.9 / Nocturia R35.1 / Morning Headaches G44.221 / Difficulty concentrating R41.840 / Memory problems or poor judgment G31.84 / Personality changes or irritability R45.4 / Loud snoring R06.83 / Depression F32.9 / Unrefreshed by sleep G47.8 / Impotence N52.9 / History of high blood pressure R03.0 / Insomnia G47.00

## 2022-11-23 NOTE — Telephone Encounter (Signed)
Pharmacy Patient Advocate Encounter  Prior Authorization for [facility] G6 SENSORS  has been approved.    PA# [id number] Effective dates: 11/23/22 through 11/23/23  Karie Soda, [place] Patient Advocate Specialist Direct Number: [phone] Fax: [phone]

## 2022-11-23 NOTE — ED Notes (Signed)
Pt to xr 

## 2022-11-23 NOTE — Telephone Encounter (Signed)
PA needed on Dexcom sensors.

## 2022-11-23 NOTE — Telephone Encounter (Signed)
[facility] lab called with critical Potassium level at 6.5.  Please advise any changes.

## 2022-11-23 NOTE — Telephone Encounter (Signed)
See note attached to labs

## 2022-11-23 NOTE — Telephone Encounter (Signed)
Spoke with patient and informed her of critical lab results - potassium 6.5. Explained to stop medications (Spironolactone, Potassium, and Losartan), she understood by repeating instructions back to me. I also explained to go to [facility] ED to have repeat labs and repeat EKG. She indicated she could not leave work until [employer] and would then have to get her kids picked up and taken to someone before she could go to ED. She stated she would go to ED after that. Dr. Aundra Dubin informed.

## 2022-11-23 NOTE — ED Triage Notes (Signed)
Pt via pov from home. She received a call from heart failure clinic that she has a critical potassium level and needs a redraw and repeat EKG. Potassium 6.5 today. Pt alert & oriented, nad noted.

## 2022-11-23 NOTE — Patient Instructions (Signed)
STOP Potassium  START Chantix as directed on box.  Labs done today, your results will be available in MyChart, we will contact you for abnormal readings.  Your physician has requested that you have an echocardiogram. Echocardiography is a painless test that uses sound waves to create images of your heart. It provides your doctor with information about the size and shape of your heart and how well your heart's chambers and valves are working. This procedure takes approximately one hour. There are no restrictions for this procedure. Please do NOT wear cologne, perfume, aftershave, or lotions (deodorant is allowed). Please arrive 15 minutes prior to your appointment time.  Your provider has recommended that you have a home sleep study.  We have provided you with the equipment in our office today. Please go ahead and download the app. DO NOT OPEN THE BOX UNTIL WE ADVISE YOU TO DO SO, once insurance has approved. Once given permission to proceed with the test, open althe app and follow the instructions. YOUR PIN NUMBER IS: [id number]. Once you have completed the test you just dispose of the equipment, the information is automatically uploaded to [place] via blue-tooth technology. If your test is positive for sleep apnea and you need a home CPAP machine you will be contacted by Dr Theodosia Blender office [place]) to set this up.  Your physician recommends that you schedule a follow-up appointment in: 2 months  If you have any questions or concerns before your next appointment please send [place] a message through [place] or call our office at [phone].    TO LEAVE A MESSAGE FOR THE NURSE SELECT OPTION 2, PLEASE LEAVE A MESSAGE INCLUDING: YOUR NAME DATE OF BIRTH CALL BACK NUMBER REASON FOR CALL**this is important as we prioritize the call backs  YOU WILL RECEIVE A CALL BACK THE SAME DAY AS LONG AS YOU CALL BEFORE 4:00 PM  At the [facility], you and your health needs are our priority. As part  of our continuing mission to provide you with exceptional heart care, we have created designated Provider Care Teams. These Care Teams include your primary Cardiologist (physician) and Advanced Practice Providers (APPs- Physician Assistants and Nurse Practitioners) who all work together to provide you with the care you need, when you need it.   You may see any of the following providers on your designated Care Team at your next follow up: Dr Glori Bickers Dr Loralie Champagne Dr. Roxana Hires, NP Lyda Jester, [place] [facility] [place], [place] Forestine Na, NP Audry Riles, PharmD   Please be sure to bring in all your medications bottles to every appointment.

## 2022-11-23 NOTE — Telephone Encounter (Signed)
Tonda called me to let me know that she had been contacted by the clinic to go to the hospital  @ [facility] due to critical Potassium levels.    [name], [place] 11/23/2022

## 2022-11-23 NOTE — Progress Notes (Signed)
Paramedicine Encounter   Patient ID: Sarah Long , female,   DOB: [birth year],51 y.o.,  MRN: [id number]   Met patient in clinic today with provider.  Time spent with patient 30 minutes  Pt to discontinue Potassium  today.  Dr. Aundra Dubin to put in orders for sleep study.  Sarah Long agrees to try Chantix to help quit smoking.  Also so have labwork done today.  Sarah Long advises me that she has been able to secure a house a few more logistical hurdles and she should be in her new home.    [name], [place] 11/23/2022

## 2022-11-23 NOTE — Telephone Encounter (Signed)
Pharmacy Patient Advocate Encounter   Received notification from [place] that prior authorization for [facility] G6 SENSORS is needed.    PA submitted on 11/23/22 Key [id number] Status is pending  Karie Soda, [place] Patient Advocate Specialist Direct Number: [phone] Fax: [phone]

## 2022-11-23 NOTE — ED Provider Notes (Signed)
[facility] EMERGENCY DEPT Provider Note   CSN: [id number] Arrival date & time: 11/23/22  2018     History  No chief complaint on file.   [name] is a 51 y.o. female.  Patient is a 51 year old female with a past medical history of CHF, Fabry's, diabetes, hypertension presenting to the emergency department with abnormal labs.  Patient states that she was seen in the CHF clinic today for normal follow-up and had labs done.  She states that she received a call today that her potassium was high and recommended that she come to the emergency department.  She denies any recent nausea, vomiting or diarrhea, chest pain or heart palpitations.  She states that she has been urinating normally.  She states that she was previously on a potassium supplement and was told today that she should stop the supplement.  The history is provided by the patient.       Home Medications Prior to Admission medications   Medication Sig Start Date End Date Taking? Authorizing Provider  acetaminophen (TYLENOL) 325 MG tablet Take 650 mg by mouth every 6 (six) hours as needed (pain).    [provider]  atorvastatin (LIPITOR) 20 MG tablet Take 1 tablet (20 mg total) by mouth daily. 05/06/22   Larey Dresser, MD  Blood Pressure Monitoring (BLOOD PRESSURE MONITOR 7) DEVI Measure blood pressure daily 03/31/22   [name], MD  carvedilol (COREG) 6.25 MG tablet Take 1 tablet (6.25 mg total) by mouth 2 (two) times daily with a meal. 05/06/22   Larey Dresser, MD  Continuous Blood Gluc Sensor (DEXCOM G6 SENSOR) MISC 1 Device by Does not apply route as directed. 10/26/22   Shamleffer, Melanie Crazier, MD  Continuous Blood Gluc Transmit (DEXCOM G6 TRANSMITTER) MISC 1 Device by Does not apply route as directed. 10/26/22   Shamleffer, Melanie Crazier, MD  diclofenac Sodium (VOLTAREN ARTHRITIS PAIN) 1 % GEL Apply 2 g topically 4 (four) times daily. 11/11/22   Argentina Donovan, PA-C   furosemide (LASIX) 20 MG tablet Take 2 tablets (40 mg total) by mouth every morning AND 1 tablet (20 mg total) every evening. 08/30/22   Milford, Maricela Bo, FNP  hydrALAZINE (APRESOLINE) 25 MG tablet Take 1 tablet (25 mg total) by mouth 3 (three) times daily. 11/15/22   [place]ford, Maricela Bo, FNP  Insulin Disposable Pump (OMNIPOD 5 G6 POD, GEN 5,) MISC 1 Device by Does not apply route every other day. 06/14/22   Shamleffer, Melanie Crazier, MD  insulin lispro (HUMALOG) 100 UNIT/ML injection Max daily 125 units a day per pump 08/30/22   Shamleffer, Melanie Crazier, MD  Insulin Pen Needle 31G X 5 MM MISC Use as directed in the morning, at noon, in the evening, and at bedtime. 11/02/21   Shamleffer, Melanie Crazier, MD  isosorbide mononitrate (ISMO) 20 MG tablet Take 1 tablet (20 mg total) by mouth daily. 11/15/22   Milford, Maricela Bo, FNP  latanoprost (XALATAN) 0.005 % ophthalmic solution Place 1 drop into both eyes at bedtime.    [provider]  losartan (COZAAR) 25 MG tablet Take 2 tablets (50 mg total) by mouth daily. 05/06/22   Larey Dresser, MD  spironolactone (ALDACTONE) 25 MG tablet Take 1 tablet (25 mg total) by mouth daily. 07/29/22   Larey Dresser, MD  Varenicline Tartrate, Starter, (CHANTIX STARTING MONTH PAK) 0.5 MG X 11 & 1 MG X 42 TBPK As directed 11/23/22   Larey Dresser, MD  Allergies    Lamictal [lamotrigine], Strawberry extract, and Sulfa antibiotics    Review of Systems   Review of Systems  Physical Exam Updated Vital Signs BP (!) 143/96 (BP Location: Right Arm)   Pulse 78   Temp 98.4 F (36.9 C) (Oral)   Resp 17   Ht '5\' 4"'$  (1.626 m)   Wt 95.7 kg   LMP 11/09/2022 (Approximate)   SpO2 100%   BMI 36.22 kg/m  Physical Exam Vitals and nursing note reviewed.  Constitutional:      General: She is not in acute distress.    Appearance: Normal appearance.  HENT:     Head: Normocephalic and atraumatic.     Nose: Nose normal.     Mouth/Throat:     Mouth:  Mucous membranes are moist.     Pharynx: Oropharynx is clear.  Eyes:     Extraocular Movements: Extraocular movements intact.     Conjunctiva/sclera: Conjunctivae normal.  Cardiovascular:     Rate and Rhythm: Normal rate and regular rhythm.     Heart sounds: Normal heart sounds.  Pulmonary:     Effort: Pulmonary effort is normal.     Breath sounds: Normal breath sounds.  Abdominal:     General: Abdomen is flat.     Palpations: Abdomen is soft.     Tenderness: There is no abdominal tenderness.  Musculoskeletal:        General: Normal range of motion.     Cervical back: Normal range of motion and neck supple.     Comments: LLE BKA  Skin:    General: Skin is warm and dry.  Neurological:     General: No focal deficit present.     Mental Status: She is alert and oriented to person, place, and time.  Psychiatric:        Mood and Affect: Mood normal.        Behavior: Behavior normal.     ED Results / Procedures / Treatments   Labs (all labs ordered are listed, but only abnormal results are displayed) Labs Reviewed  BASIC METABOLIC PANEL - Abnormal; Notable for the following components:      Result Value   Sodium 133 (*)    Potassium 5.9 (*)    CO2 17 (*)    Glucose, Bld 164 (*)    BUN 38 (*)    Creatinine, Ser 1.57 (*)    GFR, Estimated 40 (*)    All other components within normal limits  CBC - Abnormal; Notable for the following components:   RBC 3.72 (*)    Hemoglobin 10.9 (*)    HCT 33.9 (*)    All other components within normal limits  PREGNANCY, URINE    EKG None  Radiology DG Chest 2 View  Result Date: 11/23/2022 CLINICAL DATA:  Hyperkalemia EXAM: CHEST - 2 VIEW COMPARISON:  07/21/2022 FINDINGS: Left-sided pacing device as before. Mild cardiomegaly. No acute airspace disease, pleural effusion or pneumothorax. IMPRESSION: No active cardiopulmonary disease. Mild cardiomegaly. Electronically Signed   By: Donavan Foil M.D.   On: 11/23/2022 20:52     Procedures Procedures    Medications Ordered in ED Medications  sodium zirconium cyclosilicate (LOKELMA) packet 10 g (10 g Oral Given 11/23/22 2315)    ED Course/ Medical Decision Making/ A&P Clinical Course as of 11/23/22 2319  Tue Nov 23, 2022  2313 Patient's potassium is mildly elevated at 5.9 without any hyperkalemic EKG changes.  The patient was just stopped on her potassium  supplement today after receiving her labs and was also recommended to stop her spironolactone and losartan.  Patient will be given a one-time dose of Lokelma and was recommended to have a repeat potassium level checked later this week.  Patient is agreeable with the plan is given strict return precautions. [VK]    Clinical Course User Index [VK] Kemper Durie, DO                           Medical Decision Making This patient presents to the ED with chief complaint(s) of abnormal labs with pertinent past medical history of CHF, HTN, Fabry's, DM which further complicates the presenting complaint. The complaint involves an extensive differential diagnosis and also carries with it a high risk of complications and morbidity.    The differential diagnosis includes hyperkalemia, lab error, arrhythmia, AKI, ARF, medication side effect  Additional history obtained: Additional history obtained from spouse Records reviewed cardiology records  ED Course and Reassessment: Patient was initially evaluated by triage and will have repeat labs performed here to reevaluate for her potassium level.  She is asymptomatic at this time.  At her CHF clinic she has been on a potassium supplement in addition to spironolactone which may be contributing to her hyperkalemia.  Independent labs interpretation:  The following labs were independently interpreted: Potassium 5.9, mild AKI  Independent visualization of imaging: - I independently visualized the following imaging with scope of interpretation limited to determining  acute life threatening conditions related to emergency care: CXR, which revealed no acute disease  Consultation: - Consulted or discussed management/test interpretation w/ external professional: N/A  Consideration for admission or further workup: Patient has no emergent conditions requiring admission or further work-up at this time and is stable for discharge home with primary care follow-up  Social Determinants of health: N/A    Amount and/or Complexity of Data Reviewed Labs: ordered. Radiology: ordered.  Risk Prescription drug management.          Final Clinical Impression(s) / ED Diagnoses Final diagnoses:  Hyperkalemia  AKI (acute kidney injury) [facility])    Rx / [place] Orders ED Discharge Orders     None         Kemper Durie, DO 11/23/22 2319

## 2022-11-23 NOTE — Telephone Encounter (Signed)
Starting PA now, see chart notes for updates.

## 2022-11-23 NOTE — Progress Notes (Addendum)
PCP: Pcp, No Endocrinology: Dr. Kelton Pillar EP: Dr. Curt Bears Cardiology: Dr. Aundra Dubin  51 y.o. with history of type 2 diabetes, HTN, hyperlipidemia, complete heart block with PPM, and chronic diastolic CHF was referred by Dr. Joya Gaskins for evaluation of CHF.  Patient has had diabetes since age 46 and has had HTN for at least 5 years.  She is a smoker and has a history of prior ETOH abuse with ETOH pancreatitis. In 3/21, she was admitted with DKA.  She was found to have associated complete heart block that did not resolve, she had St Jude PPM placed.  She had an echocardiogram showing EF 40-45% with severe LV hypertrophy.  She did not have a cardiac MRI prior to PPM placement.  Cath in 3/21 showed no significant CAD (nonischemic cardiomyopathy).   Last seen in 5/21, doing well but not taking any cardiac meds. Entresto started and started work up for cardiac sarcoidosis and cardiac amyloidosis.  Echo (12/22): EF 70-75%, severe LVH, normal RV, mild to moderate MR.  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 She has a functioning orthotic.   She had follow up with EP 1/23, PPM functioning appropriately. Daily lasix restarted and cMRI arranged.  cMRI (2/23) with LVEF 68%, severe LVH, ECV 54%, no LVOT gradient, poor nulling with uninterpretable delayed enhancement.   Follow up 5/23, NYHA II symptoms, volume overloaded. Lasix increase to 40 daily and genetic testing arranged to evaluate for TTR amyloidosis and hypertrophic cardiomyopathies. 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  Of note, "heart problems" run in family with mother and maternal grandparents having CHF.  Alpha galactosidase activity was low at 20.1.  She was seen for genetics counseling by Dr. 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   Today she returns for HF follow up, here with paramedic Katie. She was referred to the [facility] clinic in [place] but has not  heard from them yet.  She is still smoking 3-4 cigarettes/day.  She is chronically short of breath with fast walking or walking up stairs.  No dyspnea.  No orthopnea/PND.  No lightheadedness.  She reports snoring and daytime sleepiness. Weight down 4 lbs.   ECG (personally reviewed): NSR with RV pacing  Labs (4/21): K 4, creatinine 0.87 Labs (2/23): K 4.4, creatinine 1.12 Labs (5/23): K 5.2, creatinine 1.14, LDL 53, HDL 42 Labs (8/23): K 4.4, creatinine 1.48 Labs (10/23): K 5.1, creatinine 1.34  PMH: 1. Type 2 diabetes: Diagnosed at age 75.  2. HTN 3. Hyperlipidemia 4. Active smoker 5. H/o ETOH pancreatitis 6. Bipolar disorder 7. Complete heart block: 3/21, associate with DKA episode.  Has St Jude PPM.  8. Chronic systolic CHF: Nonischemic cardiomyopathy.  Echo (3/21) with EF 40-45%, severe LVH, mild MR, normal RV.  - LHC (3/21) normal coronaries.  - PYP scan (6/21): Negative for TTR cardiac amyloidosis.  - cMRI (2/23): LVEF 68%, severe LVH, ECV 54%, no LVOT gradient, poor nulling with uninterpretable delayed enhancement.  - CTA chest (7/23): No evidence for pulmonary sarcoidosis, no ILD.  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  - Alpha-galactosidase activity testing low (20.1) 9. S/p left BKA (7/22). Has Phantom Limb pain. 10. CKD stage 3: Diabetic nephropathy, ?related to Fabry disease.   SH: Divorced, un-employed, prior heavy ETOH now rare, smokes 5 cigarettes daily.  FH: Grandfather with CHF. No known sarcoidosis or amyloidosis.   ROS: All systems reviewed  and negative except as per HPI.   Current Outpatient Medications  Medication Sig Dispense Refill   acetaminophen (TYLENOL) 325 MG tablet Take 650 mg by mouth every 6 (six) hours as needed (pain).     atorvastatin (LIPITOR) 20 MG tablet Take 1 tablet (20 mg total) by mouth daily. 90 tablet 3   Blood Pressure Monitoring (BLOOD PRESSURE MONITOR 7) DEVI Measure blood pressure  daily 1 each 0   carvedilol (COREG) 6.25 MG tablet Take 1 tablet (6.25 mg total) by mouth 2 (two) times daily with a meal. 180 tablet 3   Continuous Blood Gluc Sensor (DEXCOM G6 SENSOR) MISC 1 Device by Does not apply route as directed. 9 each 3   Continuous Blood Gluc Transmit (DEXCOM G6 TRANSMITTER) MISC 1 Device by Does not apply route as directed. 1 each 3   diclofenac Sodium (VOLTAREN ARTHRITIS PAIN) 1 % GEL Apply 2 g topically 4 (four) times daily. 100 g 1   furosemide (LASIX) 20 MG tablet Take 2 tablets (40 mg total) by mouth every morning AND 1 tablet (20 mg total) every evening. 90 tablet 2   hydrALAZINE (APRESOLINE) 25 MG tablet Take 1 tablet (25 mg total) by mouth 3 (three) times daily. 270 tablet 3   Insulin Disposable Pump (OMNIPOD 5 G6 POD, GEN 5,) MISC 1 Device by Does not apply route every other day. 9 each 3   insulin lispro (HUMALOG) 100 UNIT/ML injection Max daily 125 units a day per pump 40 mL 11   Insulin Pen Needle 31G X 5 MM MISC Use as directed in the morning, at noon, in the evening, and at bedtime. 400 each 3   isosorbide mononitrate (ISMO) 20 MG tablet Take 1 tablet (20 mg total) by mouth daily. 90 tablet 3   latanoprost (XALATAN) 0.005 % ophthalmic solution Place 1 drop into both eyes at bedtime.     losartan (COZAAR) 25 MG tablet Take 2 tablets (50 mg total) by mouth daily. 90 tablet 3   spironolactone (ALDACTONE) 25 MG tablet Take 1 tablet (25 mg total) by mouth daily. 90 tablet 3   Varenicline Tartrate, Starter, (CHANTIX STARTING MONTH PAK) 0.5 MG X 11 & 1 MG X 42 TBPK As directed 53 each 0   No current facility-administered medications for this encounter.   Wt Readings from Last 3 Encounters:  11/23/22 95.7 kg (211 lb)  11/23/22 95.7 kg (211 lb)  11/11/22 97.5 kg (215 lb)   BP 110/70   Pulse 89   Wt 95.7 kg (211 lb)   LMP 11/09/2022 (Approximate)   SpO2 99%   BMI 36.22 kg/m  General: NAD Neck: No JVD, no thyromegaly or thyroid nodule.  Lungs: Clear to  auscultation bilaterally with normal respiratory effort. CV: Nondisplaced PMI.  Heart regular S1/S2, no S3/S4, no murmur.  No peripheral edema.  No carotid bruit.  Normal pedal pulses.  Abdomen: Soft, nontender, no hepatosplenomegaly, no distention.  Skin: Intact without lesions or rashes.  Neurologic: Alert and oriented x 3.  Psych: Normal affect. Extremities: No clubbing or cyanosis.  HEENT: Normal.   Assessment/Plan: 1. Chronic systolic CHF: Echo in 9/48 with EF 40-45%, severe LV hypertrophy.  This was found in association with complete heart block.  Nonischemic cardiomyopathy, cath in 3/21 showed no significant CAD.  PYP scan (6/21) not suggestive of TTR amyloidosis. cMRI (2/23) showed LVEF 68%, severe LVH, ECV 54%, no LVOT gradient, poor nulling with uninterpretable delayed enhancement. CTA chest did not show evidence for pulmonary sarcoidosis.  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  There is wide phenotypic variation in female Fabry's heterozygotes, but I suspect that the cardiomyopathy with LVH and h/o complete heart block in this situation is most likely due to Fabry's disease. She is not volume overloaded on exam with NYHA class II-III symptoms.  - We will continue to work to get her an appt at the [facility] in [place]. She will need treatment with Fabrazyme (enzyme replacement).  - Continue hydralazine/Imdur.  - Continue Lasix 40 qam/20 qpm. BMET today.  - Continue spironolactone 25 mg daily.  - Continue Coreg 6.25 mg bid. - Continue losartan 50 mg daily. - Stop KCl supplement, K has been high normal. - Echo at followup appt.  2. HTN: Controlled.  3. Hyperlipidemia: Good lipids 5/23. 4. Active smoker: I strongly urged her to quit.    - Will give prescription for Chantix.  5. Type 2 diabetes: Followed by Endocrinology. She is on insulin. 6. Complete heart  block: She has a St Jude PPM and is pacing her RV > 99% of the time.  It is possible that this could be related to Fabry's disease.  7. Obesity: Body mass index is 36.22 kg/m. - Considered referral for GLP1RA, but she has history of pancreatitis. 8. CKD stage 3: Suspect diabetic nephropathy.  This may be related to Fabry's disease as well.   Follow up in 2 months with echo  Loralie Champagne  11/23/2022  Labs today showed K 6.5, creatinine 1.6.   With creatinine that high, patient was requested to go to the ER for repeat BMET and treatment of K if needed.  She was told to stop KCl today already, will additionally need to hold spironolactone and losartan for now.   Sarah Long [employer]

## 2022-11-23 NOTE — Addendum Note (Signed)
Encounter addended by: Larey Dresser, MD on: 11/23/2022 10:15 PM  Actions taken: Clinical Note Signed

## 2022-11-25 ENCOUNTER — Telehealth (HOSPITAL_COMMUNITY): Payer: Self-pay | Admitting: Surgery

## 2022-11-25 ENCOUNTER — Other Ambulatory Visit: Payer: Self-pay

## 2022-11-25 ENCOUNTER — Telehealth (HOSPITAL_COMMUNITY): Payer: Self-pay

## 2022-11-25 ENCOUNTER — Telehealth (HOSPITAL_COMMUNITY): Payer: Self-pay | Admitting: Licensed Clinical Social Worker

## 2022-11-25 DIAGNOSIS — I5042 Chronic combined systolic (congestive) and diastolic (congestive) heart failure: Secondary | ICD-10-CM

## 2022-11-25 NOTE — Telephone Encounter (Signed)
Referral sent to Dr. Lelan Pons McDonald's office at [facility]

## 2022-11-25 NOTE — Telephone Encounter (Signed)
Informed patient of referral getting faxed to [facility]. She repeat labs on Tuesday to make sure K is trending down. Labs scheduled

## 2022-11-25 NOTE — Telephone Encounter (Signed)
H&V Care Navigation CSW Progress Note  Clinical Social Worker assisted pt in paying for water deposit so water could be turned on at new residence which pt moved into yesterday.  No further needs at this time  Patient is participating in a Managed Medicaid Plan:  Yes  [place]: Food Insecurity Present (11/01/2022)  Housing: High Risk (09/07/2022)  Transportation Needs: Unmet Transportation Needs (04/01/2022)  Depression (PHQ2-9): Low Risk  (11/11/2022)  Financial Resource Strain: High Risk (11/25/2022)  Tobacco Use: High Risk (11/23/2022)     Jorge Ny, [facility] Desk#: [phone] Cell#: [phone]

## 2022-11-25 NOTE — Telephone Encounter (Signed)
I called patient and left message to inform her that she can proceed with ordered home sleep study. Insurance precert is not required.
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
## 2022-11-30 ENCOUNTER — Ambulatory Visit (HOSPITAL_COMMUNITY)
Admission: RE | Admit: 2022-11-30 | Discharge: 2022-11-30 | Disposition: A | Discharge status: Home / Self Care | Payer: Commercial Managed Care - HMO | Source: Ambulatory Visit | Attending: Internal Medicine | Admitting: Internal Medicine

## 2022-11-30 DIAGNOSIS — I5042 Chronic combined systolic (congestive) and diastolic (congestive) heart failure: Secondary | ICD-10-CM | POA: Insufficient documentation

## 2022-11-30 LAB — BASIC METABOLIC PANEL
Anion gap: 6 (ref 5–15)
BUN: 19 mg/dL (ref 6–20)
CO2: 18 mmol/L — ABNORMAL LOW (ref 22–32)
Calcium: 8.6 mg/dL — ABNORMAL LOW (ref 8.9–10.3)
Chloride: 113 mmol/L — ABNORMAL HIGH (ref 98–111)
Creatinine, Ser: 1.06 mg/dL — ABNORMAL HIGH (ref 0.44–1.00)
GFR, Estimated: >60 mL/min (ref >60)
Glucose, Bld: 195 mg/dL — ABNORMAL HIGH (ref 70–99)
Potassium: 5.1 mmol/L (ref 3.5–5.1)
Sodium: 137 mmol/L (ref 135–145)

## 2022-12-01 ENCOUNTER — Telehealth (HOSPITAL_COMMUNITY): Payer: Self-pay

## 2022-12-01 NOTE — Telephone Encounter (Signed)
Patient aware. APP appointment scheduled. She is following up with [facility] for appointment.

## 2022-12-03 ENCOUNTER — Other Ambulatory Visit (HOSPITAL_COMMUNITY): Payer: Self-pay

## 2022-12-03 NOTE — Telephone Encounter (Signed)
Please see encounter from 11-23-2022 to see that a prior authorization for Dexcom has been approved through [place] until 11-23-2023. Thank you

## 2022-12-08 ENCOUNTER — Ambulatory Visit: Payer: Medicaid Other | Admitting: Physical Medicine and Rehabilitation

## 2022-12-08 ENCOUNTER — Telehealth (HOSPITAL_COMMUNITY): Payer: Self-pay | Admitting: Emergency Medicine

## 2022-12-08 NOTE — Telephone Encounter (Signed)
Spoke with Mr. Lichter this morning to see how she was settling into her new home.  She still has a lot going on but is happy to be more settled in a place of her own.  Scheduled a home visit for in the morning around 9:30.    [name], [place] 12/08/2022

## 2022-12-09 ENCOUNTER — Other Ambulatory Visit (HOSPITAL_COMMUNITY): Payer: Self-pay

## 2022-12-09 ENCOUNTER — Other Ambulatory Visit (HOSPITAL_COMMUNITY): Payer: Self-pay | Admitting: Emergency Medicine

## 2022-12-09 ENCOUNTER — Ambulatory Visit: Payer: Medicaid Other | Admitting: Physical Medicine and Rehabilitation

## 2022-12-09 DIAGNOSIS — I5042 Chronic combined systolic (congestive) and diastolic (congestive) heart failure: Secondary | ICD-10-CM

## 2022-12-09 MED ORDER — ATORVASTATIN CALCIUM 20 MG PO TABS: 20.0000 mg | ORAL_TABLET | Freq: Every day | ORAL | 3 refills | Status: DC

## 2022-12-09 MED ORDER — CARVEDILOL 6.25 MG PO TABS: 6.2500 mg | ORAL_TABLET | Freq: Two times a day (BID) | ORAL | 3 refills | Status: DC

## 2022-12-09 MED ORDER — ISOSORBIDE MONONITRATE 20 MG PO TABS: 20.0000 mg | ORAL_TABLET | Freq: Every day | ORAL | 3 refills | Status: DC

## 2022-12-09 MED ORDER — HYDRALAZINE HCL 25 MG PO TABS: 25.0000 mg | ORAL_TABLET | Freq: Three times a day (TID) | ORAL | 3 refills | Status: DC

## 2022-12-09 NOTE — Progress Notes (Addendum)
Paramedicine Encounter    Patient ID: Sarah Long, female    DOB: 1971/04/15, 52 y.o.   MRN: [id number]   BP (!) 140/90 (BP Location: Left Arm, Patient Position: Sitting, Cuff Size: Normal)   Pulse 80   Resp 16   Wt 211 lb 6.4 oz (95.9 kg)   LMP 11/09/2022 (Approximate)   BMI 36.29 kg/m  Weight yesterday-not taken Last visit weight-not taken Cbg 296  Home visit today in Ms. Satter new home.  Not much furniture yet but a definite improvement from hotel living.  Pt. Reports to be feeling well.  No chest pain or SOB. Lung sounds clear and equal bilat and no noted edema.  She is still struggling with her insurance/medicaid issues.  She is is supposed to have [employer] Medicaid but has somehow been switched to [place].  She advises that she did not authorize this change and now she has co-pays that prohibit her from being able to get her medicines.  She has been making calls and even visiting social services in person for assistance but has not gotten a resolution to this as of yet. Med box reconciled x 1 week.  Pt was changed over from Bidil to Isosorbide and Hydralazine because of the Bidil not being covered.  For the time being she has Bidil on hand and is taking 1/2 tablet TID.  Med box reflects same at this time.    I will reach out to [place] on Tuesday to see if there are other avenues available to get her switched back.  She has not been taking her insulin and no using her dexcom because of the expense.   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  I would like to wait until she has resolution with her medicaid issues before I let her go. Home visit complete.   [name], [place] 12/09/2022   Patient Care Team: Pcp, No as PCP - General Sueanne Margarita, MD as PCP - Cardiology (Cardiology) Constance Haw, MD as PCP - Electrophysiology (Cardiology) Osker Mason,  RPH-CPP (Pharmacist) Ethelda Chick as Social Worker  Patient Active Problem List   Diagnosis Date Noted   Peripheral vascular disease, unspecified ([place]) 03/22/2022   Type 2 diabetes mellitus with diabetic polyneuropathy, with long-term current use of insulin ([place]) 02/03/2022   Type 2 diabetes mellitus with hyperglycemia, with long-term current use of insulin ([place]) 02/03/2022   Microalbuminuria 02/03/2022   Homelessness 11/19/2021   Right great toe amputee ([place]) 11/03/2021   Class 2 severe obesity due to excess calories with serious comorbidity and body mass index (BMI) of 36.0 to 36.9 in adult [facility]) 10/15/2021   Insomnia 10/07/2021   Left below-knee amputee ([place]) 07/01/2021   Normocytic anemia 06/26/2021   Heart failure with mid-range ejection fraction ([place]) 06/26/2021   Diabetic nephropathy with proteinuria ([place]) 06/26/2021   Dyspnea    Dental caries 04/01/2020   Cracked tooth 04/01/2020   Alcohol use 03/11/2020   Vitamin D deficiency 03/11/2020   Prolonged QT interval 02/26/2020   Chronic combined systolic and diastolic CHF (congestive heart failure) ([place]) 02/26/2020   Heart block AV complete ([place]) 02/11/2020   Exocrine pancreatic insufficiency    Dysmenorrhea 10/02/2019   DM type 2 with diabetic peripheral neuropathy ([place]) 05/30/2019   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 block) 05/29/2019   Former smoker 11/26/2013   Hypercholesteremia 02/02/2007  Bipolar 1 disorder ([place]) 02/02/2007   HYPERTENSION, BENIGN SYSTEMIC 02/02/2007    Current Outpatient Medications:    acetaminophen (TYLENOL) 325 MG tablet, Take 650 mg by mouth every 6 (six) hours as needed (pain)., Disp: , Rfl:    atorvastatin (LIPITOR) 20 MG tablet, Take 1 tablet (20 mg total) by mouth daily., Disp: 90 tablet, Rfl: 3   carvedilol (COREG) 6.25 MG tablet, Take 1 tablet (6.25 mg total) by mouth 2 (two) times daily with a meal., Disp: 180 tablet, Rfl: 3   Continuous Blood Gluc Sensor  (DEXCOM G6 SENSOR) MISC, 1 Device by Does not apply route as directed., Disp: 9 each, Rfl: 3   furosemide (LASIX) 20 MG tablet, Take 2 tablets (40 mg total) by mouth every morning AND 1 tablet (20 mg total) every evening., Disp: 90 tablet, Rfl: 2   latanoprost (XALATAN) 0.005 % ophthalmic solution, Place 1 drop into both eyes at bedtime., Disp: , Rfl:    losartan (COZAAR) 25 MG tablet, Take 2 tablets (50 mg total) by mouth daily., Disp: 90 tablet, Rfl: 3   spironolactone (ALDACTONE) 25 MG tablet, Take 1 tablet (25 mg total) by mouth daily., Disp: 90 tablet, Rfl: 3   Blood Pressure Monitoring (BLOOD PRESSURE MONITOR 7) DEVI, Measure blood pressure daily (Patient not taking: Reported on 12/09/2022), Disp: 1 each, Rfl: 0   Continuous Blood Gluc Transmit (DEXCOM G6 TRANSMITTER) MISC, 1 Device by Does not apply route as directed. (Patient not taking: Reported on 12/09/2022), Disp: 1 each, Rfl: 3   diclofenac Sodium (VOLTAREN ARTHRITIS PAIN) 1 % GEL, Apply 2 g topically 4 (four) times daily. (Patient not taking: Reported on 12/09/2022), Disp: 100 g, Rfl: 1   hydrALAZINE (APRESOLINE) 25 MG tablet, Take 1 tablet (25 mg total) by mouth 3 (three) times daily., Disp: 270 tablet, Rfl: 3   Insulin Disposable Pump (OMNIPOD 5 G6 POD, GEN 5,) MISC, 1 Device by Does not apply route every other day. (Patient not taking: Reported on 12/09/2022), Disp: 9 each, Rfl: 3   insulin lispro (HUMALOG) 100 UNIT/ML injection, Max daily 125 units a day per pump (Patient not taking: Reported on 12/09/2022), Disp: 40 mL, Rfl: 11   Insulin Pen Needle 31G X 5 MM MISC, Use as directed in the morning, at noon, in the evening, and at bedtime. (Patient not taking: Reported on 12/09/2022), Disp: 400 each, Rfl: 3   isosorbide mononitrate (ISMO) 20 MG tablet, Take 1 tablet (20 mg total) by mouth daily., Disp: 90 tablet, Rfl: 3   Varenicline Tartrate, Starter, (CHANTIX STARTING MONTH PAK) 0.5 MG X 11 & 1 MG X 42 TBPK, As directed, Disp: 53 each, Rfl:  0 Allergies  Allergen Reactions   Lamictal [Lamotrigine] Itching   Strawberry Extract Hives   Sulfa Antibiotics Hives and Itching      Social History   Socioeconomic History   Marital status: Significant Other    Spouse name: Not on file   Number of children: Not on file   Years of education: Not on file   Highest education level: Not on file  Occupational History   Not on file  Tobacco Use   Smoking status: Every Day    Packs/day: 0.50    Years: 20.00    Total pack years: 10.00    Types: Cigarettes    Last attempt to quit: 07/01/2021    Years since quitting: 1.4   Smokeless tobacco: Never  Vaping Use   Vaping Use: Never used  Substance and Sexual  Activity   Alcohol use: No   Drug use: No   Sexual activity: Yes    Birth control/protection: None  Other Topics Concern   Not on file  Social History Narrative   Not on file   Social Determinants of Health   Financial Resource Strain: High Risk (11/25/2022)   Overall Financial Resource Strain (CARDIA)    Difficulty of Paying Living Expenses: Very hard  Food Insecurity: Food Insecurity Present (11/01/2022)   Hunger Vital Sign    Worried About Running Out of Food in the Last Year: Often true    Ran Out of Food in the Last Year: Often true  Transportation Needs: Unmet Transportation Needs (04/01/2022)   PRAPARE - Hydrologist (Medical): Yes    Lack of Transportation (Non-Medical): Yes  Physical Activity: Not on file  Stress: Not on file  Social Connections: Not on file  Intimate Partner Violence: Not on file    Physical Exam      Future Appointments  Date Time Provider [place]  12/15/2022  9:50 AM Shamleffer, Melanie Crazier, MD [facility] None  12/21/2022  9:00 AM [facility] PA/NP [facility] None  01/13/2023  9:50 AM [name], MD [facility] None  01/25/2023  6:10 PM [facility] DEVICE REMOTES [facility]  01/31/2023  8:00 AM MC ECHO OP 1 [facility]   01/31/2023  9:00 AM Larey Dresser, MD [facility] None  03/14/2023 10:00 AM Raulkar, Clide Deutscher, MD CPR-PRMA CPR       [name], [facility] Paramedic  12/09/22

## 2022-12-10 ENCOUNTER — Ambulatory Visit: Payer: Medicaid Other

## 2022-12-10 ENCOUNTER — Ambulatory Visit: Payer: Medicaid Other | Attending: Cardiology

## 2022-12-10 ENCOUNTER — Other Ambulatory Visit (HOSPITAL_COMMUNITY): Payer: Self-pay | Admitting: *Deleted

## 2022-12-10 DIAGNOSIS — G4719 Other hypersomnia: Secondary | ICD-10-CM

## 2022-12-10 MED ORDER — VARENICLINE TARTRATE (STARTER) 0.5 MG X 11 & 1 MG X 42 PO TBPK: ORAL_TABLET | ORAL | 0 refills | Status: DC

## 2022-12-10 MED ORDER — HYDRALAZINE HCL 25 MG PO TABS: 25.0000 mg | ORAL_TABLET | Freq: Three times a day (TID) | ORAL | 3 refills | Status: DC

## 2022-12-10 MED ORDER — ISOSORBIDE MONONITRATE 20 MG PO TABS: 20.0000 mg | ORAL_TABLET | Freq: Every day | ORAL | 3 refills | Status: DC

## 2022-12-10 MED ORDER — FUROSEMIDE 20 MG PO TABS: ORAL_TABLET | ORAL | 2 refills | Status: DC

## 2022-12-10 MED ORDER — LOSARTAN POTASSIUM 25 MG PO TABS: 50.0000 mg | ORAL_TABLET | Freq: Every day | ORAL | 3 refills | Status: DC

## 2022-12-13 ENCOUNTER — Other Ambulatory Visit: Payer: Self-pay | Admitting: Physician Assistant

## 2022-12-13 DIAGNOSIS — T8789 Other complications of amputation stump: Secondary | ICD-10-CM

## 2022-12-13 NOTE — Telephone Encounter (Signed)
Requested Prescriptions  Pending Prescriptions Disp Refills   diclofenac Sodium (VOLTAREN) 1 % GEL [Pharmacy Med Name: DICLOFENAC SODIUM 1 % EXTERNAL GEL] 100 g 1    Sig: APPLY 2 GRAMS TOPICALLY 4 (FOUR) TIMES DAILY.     Analgesics:  Topicals Failed - 12/13/2022 12:50 PM      Failed - Manual Review: Labs are only required if the patient has taken medication for more than 8 weeks.      Failed - HGB in normal range and within 360 days    Hemoglobin  Date Value Ref Range Status  11/23/2022 10.9 (L) 12.0 - 15.0 g/dL Final  07/30/2021 8.3 (L) 11.1 - 15.9 g/dL Final         Failed - HCT in normal range and within 360 days    HCT  Date Value Ref Range Status  11/23/2022 33.9 (L) 36.0 - 46.0 % Final   Hematocrit  Date Value Ref Range Status  07/30/2021 26.4 (L) 34.0 - 46.6 % Final         Failed - Cr in normal range and within 360 days    Creat  Date Value Ref Range Status  10/21/2016 0.67 0.50 - 1.10 mg/dL Final   Creatinine, Ser  Date Value Ref Range Status  11/30/2022 1.06 (H) 0.44 - 1.00 mg/dL Final   Creatinine,U  Date Value Ref Range Status  02/03/2022 70.2 mg/dL Final         Passed - PLT in normal range and within 360 days    Platelets  Date Value Ref Range Status  11/23/2022 198 150 - 400 K/uL Final  07/30/2021 342 150 - 450 x10E3/uL Final         Passed - eGFR is 30 or above and within 360 days    GFR, Est African American  Date Value Ref Range Status  10/21/2016 >89 >=60 mL/min Final   GFR calc Af Amer  Date Value Ref Range Status  07/16/2020 84 >59 mL/min/1.73 Final    Comment:    **[facility] currently reports eGFR in compliance with the current**   recommendations of the [employer]. [facility] will   update reporting as new guidelines are published from the NKF-ASN   Task force.    GFR, Est Non African American  Date Value Ref Range Status  10/21/2016 >89 >=60 mL/min Final   GFR, Estimated  Date Value Ref Range Status  11/30/2022 >60  >60 mL/min Final    Comment:    (NOTE) Calculated using the CKD-EPI Creatinine Equation (2021)    eGFR  Date Value Ref Range Status  08/10/2022 51 (L) >59 mL/min/1.73 Final         Passed - Patient is not pregnant      Passed - Valid encounter within last 12 months    Recent Outpatient Visits           1 month ago Type 2 diabetes mellitus with hyperglycemia, with long-term current use of insulin [facility])   [place], [place], [place]   4 months ago Type 2 diabetes mellitus with hyperglycemia, with long-term current use of insulin [facility])   [place] [name], MD   8 months ago HYPERTENSION, [place], Patrick E, MD   1 year ago Type 2 diabetes mellitus with stage 1 chronic kidney disease, with long-term current use of insulin ([place])   [place], [place]   1 year ago Localized edema   [place], [place]       Future Appointments             In 1 month Joya Gaskins, Burnett Harry, MD [place]

## 2022-12-14 ENCOUNTER — Encounter: Payer: Medicaid Other | Admitting: Genetic Counselor

## 2022-12-14 ENCOUNTER — Other Ambulatory Visit (HOSPITAL_COMMUNITY): Payer: Self-pay

## 2022-12-14 MED ORDER — HYDRALAZINE HCL 25 MG PO TABS: 25.0000 mg | ORAL_TABLET | Freq: Three times a day (TID) | ORAL | 3 refills | Status: DC

## 2022-12-14 MED ORDER — ISOSORBIDE MONONITRATE 20 MG PO TABS: 20.0000 mg | ORAL_TABLET | Freq: Every day | ORAL | 3 refills | Status: DC

## 2022-12-15 ENCOUNTER — Encounter: Payer: Self-pay | Admitting: Internal Medicine

## 2022-12-15 ENCOUNTER — Other Ambulatory Visit (HOSPITAL_COMMUNITY): Payer: Self-pay | Admitting: Cardiology

## 2022-12-15 ENCOUNTER — Ambulatory Visit (INDEPENDENT_AMBULATORY_CARE_PROVIDER_SITE_OTHER): Payer: Medicaid Other | Admitting: Internal Medicine

## 2022-12-15 VITALS — BP 136/82 | HR 68 | Ht 64.0 in | Wt 213.0 lb

## 2022-12-15 DIAGNOSIS — E1142 Type 2 diabetes mellitus with diabetic polyneuropathy: Secondary | ICD-10-CM | POA: Diagnosis not present

## 2022-12-15 DIAGNOSIS — E1165 Type 2 diabetes mellitus with hyperglycemia: Secondary | ICD-10-CM

## 2022-12-15 DIAGNOSIS — Z794 Long term (current) use of insulin: Secondary | ICD-10-CM

## 2022-12-15 DIAGNOSIS — E1129 Type 2 diabetes mellitus with other diabetic kidney complication: Secondary | ICD-10-CM

## 2022-12-15 DIAGNOSIS — R809 Proteinuria, unspecified: Secondary | ICD-10-CM

## 2022-12-15 LAB — POCT GLYCOSYLATED HEMOGLOBIN (HGB A1C): Hemoglobin A1C: 8.8 % — AB (ref 4.0–5.6)

## 2022-12-15 LAB — POCT GLUCOSE (DEVICE FOR HOME USE): POC Glucose: 284 mg/dl — AB (ref 70–99)

## 2022-12-15 MED ORDER — LANTUS SOLOSTAR 100 UNIT/ML ~~LOC~~ SOPN: 60.0000 [IU] | PEN_INJECTOR | Freq: Every day | SUBCUTANEOUS | 3 refills | Status: DC

## 2022-12-15 NOTE — Patient Instructions (Signed)
Please start lantus 60 units once daily when you do NOT have the pump on  If you are not able to use the dexcom , please check sugar before each meal and enter it to the pump   Please enter 14 grams of carbohydrates with each meal    HOW TO TREAT LOW BLOOD SUGARS (Blood sugar LESS THAN 70 MG/DL) Please follow the RULE OF 15 for the treatment of hypoglycemia treatment (when your (blood sugars are less than 70 mg/dL)   STEP 1: Take 15 grams of carbohydrates when your blood sugar is low, which includes:  3-4 GLUCOSE TABS  OR 3-4 OZ OF JUICE OR REGULAR SODA OR ONE TUBE OF GLUCOSE GEL    STEP 2: RECHECK blood sugar in 15 MINUTES STEP 3: If your blood sugar is still low at the 15 minute recheck --> then, go back to STEP 1 and treat AGAIN with another 15 grams of carbohydrates.

## 2022-12-15 NOTE — Progress Notes (Signed)
Name: Sarah Long  MRN/ DOB: [id number], 28-Jul-1971   Age/ Sex: 52 y.o., female    PCP: Pcp, No   Reason for Endocrinology Evaluation: Type 2 Diabetes Mellitus     Date of Initial Endocrinology Visit: 11/02/2021    PATIENT IDENTIFIER: Sarah Long is a 52 y.o. female with a past medical history of HTN , Bipolar d/o,and T2DM, has Hx of Pancreatitis . The patient presented for initial endocrinology clinic visit on 11/02/2021 for consultative assistance with her diabetes management.    HPI: Sarah Long was    Diagnosed with DM at age 37 stated as gestational diabetes by age 68 Sarah Long became diagnosed with T2 DM  Prior Medications tried/Intolerance: Intolerant to Metformin.  Hemoglobin A1c has ranged from 7.4% in 2022, peaking at 13.8% in 2014. Patient has required hospitalization within the last 1 year from hyper or hypoglycemia: DKA 06/2021   On her initial visit to our clinic Sarah Long had an A1c 7.4%, we continued MDI regimen and provided her with correction scale    Sarah Long was trained on OmniPod 59/20/2023  SUBJECTIVE:   During the last visit (06/14/2022): A1c 8.5%     Today (12/15/22): Sarah Long is here for a follow up on diabetes management. Sarah Long checks her  blood sugars multiple times daily through CGM.  The patient has  not had hypoglycemic episodes since the last clinic visit.   Sarah Long continues to follow-up with Dr. Sharol Given for left BKA and limb pain   This patient with type 2 diabetes is treated with OmniPod 5 (insulin pump). During the visit the pump basal and bolus doses were reviewed including carb/insulin rations and supplemental doses. The glucose meter download was reviewed in detail to determine if the current pump settings are providing the best glycemic control without excessive hypoglycemia. Unfortunately Sarah Long had to stop using the pump due to insurance issues   Sarah Long has been nauseous recently , but no vomiting but has constipation    Pump and meter download:    Pump    OmniPod 5 Settings   Insulin type   humalog   Basal rate       0000 2.0 u/h               I:C ratio       0000 1:1 Enter#14 with each meal                  Sensitivity       0000  25      Goal       0000  110             Type & Model of Pump: OmniPod 5 Insulin Type: Currently using humalog.  Body mass index is 36.56 kg/m.  PUMP STATISTICS: Average BG: 142  Average Daily Carbs (g): 20.8  Average Total Daily Insulin: 40.5  Average Daily Basal: 23.7 (59 %) Average Daily Bolus: 16.8 (41 %)     HOME DIABETES REGIMEN: Humalog      Statin: no ACE-I/ARB: no Prior Diabetic Education: yes   CONTINUOUS GLUCOSE MONITORING RECORD INTERPRETATION    Dates of Recording: 12/7-11/2022  Sensor description:dexcom  Results statistics:   CGM use % of time 37.2  Average and SD 142/51  Time in range  74   %  % Time Above 180 20  % Time above 250 3  % Time Below target 3      Glycemic patterns summary: Bg's optimal overnight, with occasional  hyperglycemia noted during the day  Hyperglycemic episodes postprandial   Hypoglycemic episodes occurred variably either during the day or night  Overnight periods: Optimal    DIABETIC COMPLICATIONS: Microvascular complications:  Left BKA, Right  great toe amputation , neuropathy  Denies: CKD, retinopathy  Last eye exam: Completed 01/2021  Macrovascular complications:  PAD Denies: CAD, CVA   PAST HISTORY: Past Medical History:  Past Medical History:  Diagnosis Date   Abnormal Pap smear 1998   Abnormal vaginal bleeding 12/19/2011   Acute urinary retention 06/26/2021   Arthritis    Bacterial infection    Bipolar 1 disorder (HCC)    Blister of second toe of left foot 11/21/2016   Candida vaginitis 07/2007   Depression    recently added wellbutrin-has not taken yet for bipolar   Diabetes in pregnancy    Diabetes mellitus    nph 20U qam and qpm, regular with meals   Diabetic ketoacidosis without coma  associated with type 2 diabetes mellitus ([place])    Fibroid    Galactorrhea of right breast 2008   GERD (gastroesophageal reflux disease)    H/O amenorrhea 07/2007   H/O dizziness 10/14/2011   H/O dysmenorrhea 2010   H/O menorrhagia 10/14/2011   H/O varicella    Headache(784.0)    Heavy vaginal bleeding due to contraceptive injection use 10/12/2011   Depo Provera   Herpes    HSV-2 infection 01/03/2009   Hx: UTI (urinary tract infection) 2009   Hypertension    on aldomet   Hypoalbuminemia due to protein-calorie malnutrition ([place]) 06/26/2021   Increased BMI 2010   Irregular uterine bleeding 04/04/2012   Sarah Long has mirena    Obesity 10/14/2011   Oligomenorrhea 07/2007   Osteomyelitis of great toe of right foot ([place]) 03/22/2022   Pelvic pain in female    Presence of permanent cardiac pacemaker    Preterm labor    Trichomonas    Yeast infection    Past Surgical History:  Past Surgical History:  Procedure Laterality Date   ABDOMINAL AORTOGRAM W/LOWER EXTREMITY N/A 06/17/2021   Procedure: ABDOMINAL AORTOGRAM W/LOWER EXTREMITY;  Surgeon: Wellington Hampshire, MD;  Location: [facility] [place] [facility];  Service: Cardiovascular;  Laterality: N/A;   AMPUTATION Left 06/27/2021   Procedure: AMPUTATION BELOW KNEE;  Surgeon: Newt Minion, MD;  Location: [place];  Service: Orthopedics;  Laterality: Left;   AMPUTATION Right 09/04/2021   Procedure: RIGHT GREAT TOE AMPUTATION;  Surgeon: Newt Minion, MD;  Location: [place];  Service: Orthopedics;  Laterality: Right;   CESAREAN SECTION  1991   LEFT HEART CATH AND CORONARY ANGIOGRAPHY N/A 02/10/2020   Procedure: LEFT HEART CATH AND CORONARY ANGIOGRAPHY;  Surgeon: Troy Sine, MD;  Location: [place] [facility];  Service: Cardiovascular;  Laterality: N/A;   PACEMAKER IMPLANT N/A 02/13/2020   Procedure: PACEMAKER IMPLANT;  Surgeon: Constance Haw, MD;  Location: [facility] [facility];  Service: Cardiovascular;  Laterality: N/A;   TEMPORARY PACEMAKER N/A 02/10/2020    Procedure: TEMPORARY PACEMAKER;  Surgeon: Troy Sine, MD;  Location: [place] [facility];  Service: Cardiovascular;  Laterality: N/A;    Social History:  reports that Sarah Long has been smoking cigarettes. Sarah Long has a 10.00 pack-year smoking history. Sarah Long has never used smokeless tobacco. Sarah Long reports that Sarah Long does not drink alcohol and does not use drugs. Family History:  Family History  Problem Relation Age of Onset   Hypertension Mother    Diabetes Mother    Heart  disease Mother    Hypertension Father    Diabetes Father    Heart disease Father    Stroke Maternal Grandfather    Other Neg Hx    Breast cancer Neg Hx      HOME MEDICATIONS: Allergies as of 12/15/2022       Reactions   Lamictal [lamotrigine] Itching   Strawberry Extract Hives   Sulfa Antibiotics Hives, Itching        Medication List        Accurate as of December 15, 2022  9:56 AM. If you have any questions, ask your nurse or doctor.          acetaminophen 325 MG tablet Commonly known as: TYLENOL Take 650 mg by mouth every 6 (six) hours as needed (pain).   atorvastatin 20 MG tablet Commonly known as: LIPITOR Take 1 tablet (20 mg total) by mouth daily.   B-D UF III MINI PEN NEEDLES 31G X 5 MM Misc Generic drug: Insulin Pen Needle Use as directed in the morning, at noon, in the evening, and at bedtime.   Blood Pressure Monitor 7 Devi Measure blood pressure daily   carvedilol 6.25 MG tablet Commonly known as: COREG Take 1 tablet (6.25 mg total) by mouth 2 (two) times daily with a meal.   Dexcom G6 Sensor Misc 1 Device by Does not apply route as directed.   Dexcom G6 Transmitter Misc 1 Device by Does not apply route as directed.   diclofenac Sodium 1 % Gel Commonly known as: VOLTAREN APPLY 2 GRAMS TOPICALLY 4 (FOUR) TIMES DAILY.   furosemide 20 MG tablet Commonly known as: LASIX Take 2 tablets (40 mg total) by mouth every morning AND 1 tablet (20 mg total) every evening.   hydrALAZINE 25 MG  tablet Commonly known as: APRESOLINE Take 1 tablet (25 mg total) by mouth 3 (three) times daily.   insulin lispro 100 UNIT/ML injection Commonly known as: HumaLOG Max daily 125 units a day per pump   isosorbide mononitrate 20 MG tablet Commonly known as: ISMO Take 1 tablet (20 mg total) by mouth daily.   latanoprost 0.005 % ophthalmic solution Commonly known as: XALATAN Place 1 drop into both eyes at bedtime.   losartan 25 MG tablet Commonly known as: COZAAR Take 2 tablets (50 mg total) by mouth daily.   Omnipod 5 G6 Pod (Gen 5) Misc 1 Device by Does not apply route every other day.   spironolactone 25 MG tablet Commonly known as: ALDACTONE Take 1 tablet (25 mg total) by mouth daily.   Varenicline Tartrate (Starter) 0.5 MG X 11 & 1 MG X 42 Tbpk Commonly known as: Chantix Starting Month Pak As directed         ALLERGIES: Allergies  Allergen Reactions   Lamictal [Lamotrigine] Itching   Strawberry Extract Hives   Sulfa Antibiotics Hives and Itching     REVIEW OF SYSTEMS: A comprehensive ROS was conducted with the patient and is negative except as per HPI     OBJECTIVE:   VITAL SIGNS: BP 136/82 (BP Location: Left Arm, Patient Position: Sitting, Cuff Size: Large)   Pulse 68   Ht '5\' 4"'$  (1.626 m)   Wt 213 lb (96.6 kg)   LMP 11/09/2022 (Approximate)   SpO2 97%   BMI 36.56 kg/m    PHYSICAL EXAM:  General: Sarah Long appears well and is in NAD  Lungs: Clear with good BS bilat   Heart: RRR   Extremities: Left BKA   Neuro: MS  is good with appropriate affect, Sarah Long is alert and Ox3   DM Foot Exam 02/04/2022   The skin of the feet is intact without sores or ulcerations. The pedal pulses are 2+ on right and 2+ on left. The sensation is intact to a screening 5.07, 10 gram monofilament bilaterally   DATA REVIEWED:  Lab Results  Component Value Date   HGBA1C 8.5 (A) 06/14/2022   HGBA1C 8.7 (H) 04/21/2022   HGBA1C 7.9 (A) 02/03/2022     Latest Reference Range &  Units 11/30/22 09:59  Sodium 135 - 145 mmol/L 137  Potassium 3.5 - 5.1 mmol/L 5.1  Chloride 98 - 111 mmol/L 113 (H)  CO2 22 - 32 mmol/L 18 (L)  Glucose 70 - 99 mg/dL 195 (H)  BUN 6 - 20 mg/dL 19  Creatinine 0.44 - 1.00 mg/dL 1.06 (H)  Calcium 8.9 - 10.3 mg/dL 8.6 (L)  Anion gap 5 - 15  6  GFR, Estimated >60 mL/min >60  (H): Data is abnormally high (L): Data is abnormally low    Latest Reference Range & Units 02/03/22 10:34  MICROALB/CREAT RATIO 0.0 - 30.0 mg/g 196.2 (H)      ASSESSMENT / PLAN / RECOMMENDATIONS:   1) Type 2 Diabetes Mellitus, Poorly  controlled, With neuropathic complications, S/P Left BKA and Right great toe amputation  - Most recent A1c of 8.8 %. Goal A1c < 7.0 %.     - A1c remains above goal - Sarah Long has a hx of Pancreatitis , not a candidate for DPP 4 inhibitors and GLP-1 agonist - Has hx of DKA , would avoid SGLT2 inhibitors -Sarah Long has not been on the pump for the past 3 weeks due to insurance coverage, Sarah Long is working with her current insurance and will be able to eventually stay on Medicaid and will obtain the OmniPod pump.  Patient liked the pump and would like to stay on it -Despite her elevated A1c, we have been able to download her pump through December, her average BG's at the time 142 mg/DL, within range BG's have improved from 26% to 74%.  And hyperglycemia has improved from 44% down to 20%. -Overall if Sarah Long is able to continue on the pump this will be a better option, the patient was advised that if Sarah Long has no Dexcom Sarah Long should continue to check glucose before each meal and entered into the pump while in the manual mode -I also emphasized the importance of being on basal insulin if Sarah Long is not going to be on the pump for 24 hours or more -A refill of the Lantus was prescribed to use when Sarah Long is of the pump, patient was advised to continue to use Humalog 14 units with each meal -Unable to make any changes to the pump settings as the patient did not bring her  PDM   MEDICATIONS:  -Take Lantus 60 units daily while of OmniPod -Continue Humalog 14 units with each meal -Patient will return to the current pump settings once the insurance issues have been resolved   EDUCATION / INSTRUCTIONS: BG monitoring instructions: Patient is instructed to check her blood sugars 3 times a day, before meals . Call [facility] if: BG persistently < 70  I reviewed the Rule of 15 for the treatment of hypoglycemia in detail with the patient. Literature supplied.   2) Diabetic complications:  Eye: Does not have known diabetic retinopathy.  Neuro/ Feet: Does  have known diabetic peripheral neuropathy. Renal: Patient does not have known  baseline CKD. Sarah Long is not on an ACEI/ARB at present.   3) Dyslipidemia :   - Sarah Long is not on atorvastatin, patient does not like taking medication and would rather use natural route if possible -We had discussed the increased risk of cardiovascular disease as well as CVA with hyperlipidemia in the setting of diabetes in the past, will continue to monitor      4) Microalbuminuria:   -This has remained elevated as of 2023 but was trending down -Sarah Long is on losartan -Will continue to monitor    Medication Continue  losartan 25 mg daily     Follow-up in 6 months  Signed electronically by: Mack Guise, MD  [facility] End[facility]in[facility]  [address] [place],  [address] Phone: [phone] FAX: [phone]   CC: Pcp, No No address on file Phone: None  Fax: None    Return to Endocrinology clinic as below: Future Appointments  Date Time Provider [place]  12/21/2022  9:00 AM [facility] PA/NP [facility] None  01/13/2023  9:50 AM [name], MD [facility] None  01/25/2023  6:10 PM [facility] DEVICE REMOTES [facility]  01/31/2023  8:00 AM MC ECHO OP 1 [facility]  01/31/2023  9:00 AM Larey Dresser, MD [facility] None   03/14/2023 10:00 AM Raulkar, Clide Deutscher, MD [facility] CPR

## 2022-12-17 NOTE — Progress Notes (Signed)
PCP: Pcp, No Endocrinology: Dr. Kelton Pillar EP: Dr. Curt Bears Cardiology: Dr. Aundra Dubin  52 y.o. with history of type 2 diabetes, HTN, hyperlipidemia, complete heart block with PPM, and chronic diastolic CHF was referred by Dr. Joya Gaskins for evaluation of CHF.  Patient has had diabetes since age 39 and has had HTN for at least 5 years.  She is a smoker and has a history of prior ETOH abuse with ETOH pancreatitis. In 3/21, she was admitted with DKA.  She was found to have associated complete heart block that did not resolve, she had St Jude PPM placed.  She had an echocardiogram showing EF 40-45% with severe LV hypertrophy.  She did not have a cardiac MRI prior to PPM placement.  Cath in 3/21 showed no significant CAD (nonischemic cardiomyopathy).   Last seen in 5/21, doing well but not taking any cardiac meds. Entresto started and started work up for cardiac sarcoidosis and cardiac amyloidosis.  Echo (12/22): EF 70-75%, severe LVH, normal RV, mild to moderate MR.  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 She has a functioning orthotic.   She had follow up with EP 1/23, PPM functioning appropriately. Daily lasix restarted and cMRI arranged.  cMRI (2/23) with LVEF 68%, severe LVH, ECV 54%, no LVOT gradient, poor nulling with uninterpretable delayed enhancement.   Follow up 5/23, NYHA II symptoms, volume overloaded. Lasix increase to 40 daily and genetic testing arranged to evaluate for TTR amyloidosis and hypertrophic cardiomyopathies. 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  Of note, "heart problems" run in family with mother and maternal grandparents having CHF.  Alpha galactosidase activity was low at 20.1.  She was seen for genetics counseling by Dr. 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   Follow up 12/23, NYHA II-III symptoms and volume stable. Referred to Fabry's provider at [facility] to begin treatment with Fabrazyme. Labs showed  elevated SCr of 1.6 and K of 6.5. She was instructed to hold spiro, losartan, stop KCL and go to the ED. She was given a dose of Lokelma and repeat labs arranged.  Today she returns for HF follow up. Overall feeling fatigued. She is SOB walking up steps, no dyspnea walking on flat ground. She quit smoking last month and is now on Chantix. Denies abnormal bleeding, palpitations, CP, dizziness, edema, or PND/Orthopnea. Appetite ok. No fever or chills. Weight at home 211-215 pounds. Taking all medications, she restarted her spiro and losartan. She stopped smoking 12/23, now on Chantix. She thinks she   ECG (personally reviewed): none ordered today.  Labs (4/21): K 4, creatinine 0.87 Labs (2/23): K 4.4, creatinine 1.12 Labs (5/23): K 5.2, creatinine 1.14, LDL 53, HDL 42 Labs (8/23): K 4.4, creatinine 1.48 Labs (10/23): K 5.1, creatinine 1.34 Labs (12/23): K 5.1, creatinine 1.06  PMH: 1. Type 2 diabetes: Diagnosed at age 19.  2. HTN 3. Hyperlipidemia 4. Active smoker 5. H/o ETOH pancreatitis 6. Bipolar disorder 7. Complete heart block: 3/21, associate with DKA episode.  Has St Jude PPM.  8. Chronic systolic CHF: Nonischemic cardiomyopathy.  Echo (3/21) with EF 40-45%, severe LVH, mild MR, normal RV.  - LHC (3/21) normal coronaries.  - PYP scan (6/21): Negative for TTR cardiac amyloidosis.  - cMRI (2/23): LVEF 68%, severe LVH, ECV 54%, no LVOT gradient, poor nulling with uninterpretable delayed enhancement.  - CTA chest (7/23): No evidence for pulmonary sarcoidosis, no ILD.  - [employer] gene testing with heterozygosity for  GLA mutation linked to Fabry's disease.  - Alpha-galactosidase activity testing low (20.1) 9. S/p left BKA (7/22). Has Phantom Limb pain. 10. CKD stage 3: Diabetic nephropathy, ?related to Fabry disease.   SH: Divorced, un-employed, prior heavy ETOH now rare, smokes 5 cigarettes daily.  FH: Grandfather with CHF. No known sarcoidosis or amyloidosis.   ROS: All systems  reviewed and negative except as per HPI.   Current Outpatient Medications  Medication Sig Dispense Refill   acetaminophen (TYLENOL) 325 MG tablet Take 650 mg by mouth every 6 (six) hours as needed (pain).     atorvastatin (LIPITOR) 20 MG tablet Take 1 tablet (20 mg total) by mouth daily. 90 tablet 3   Blood Pressure Monitoring (BLOOD PRESSURE MONITOR 7) DEVI Measure blood pressure daily 1 each 0   carvedilol (COREG) 6.25 MG tablet Take 1 tablet (6.25 mg total) by mouth 2 (two) times daily with a meal. 180 tablet 3   furosemide (LASIX) 20 MG tablet Take 2 tablets (40 mg total) by mouth every morning AND 1 tablet (20 mg total) every evening. 90 tablet 2   hydrALAZINE (APRESOLINE) 25 MG tablet Take 1 tablet (25 mg total) by mouth 3 (three) times daily. 270 tablet 3   insulin lispro (HUMALOG) 100 UNIT/ML injection Max daily 125 units a day per pump 40 mL 11   Insulin Pen Needle 31G X 5 MM MISC Use as directed in the morning, at noon, in the evening, and at bedtime. 400 each 3   isosorbide mononitrate (ISMO) 20 MG tablet Take 1 tablet (20 mg total) by mouth daily. 90 tablet 3   latanoprost (XALATAN) 0.005 % ophthalmic solution Place 1 drop into both eyes at bedtime.     spironolactone (ALDACTONE) 25 MG tablet Take 1 tablet (25 mg total) by mouth daily. 90 tablet 3   Varenicline Tartrate, Starter, (CHANTIX STARTING MONTH PAK) 0.5 MG X 11 & 1 MG X 42 TBPK As directed 53 each 0   Continuous Blood Gluc Sensor (DEXCOM G6 SENSOR) MISC 1 Device by Does not apply route as directed. (Patient not taking: Reported on 12/21/2022) 9 each 3   Continuous Blood Gluc Transmit (DEXCOM G6 TRANSMITTER) MISC 1 Device by Does not apply route as directed. (Patient not taking: Reported on 12/15/2022) 1 each 3   diclofenac Sodium (VOLTAREN) 1 % GEL APPLY 2 GRAMS TOPICALLY 4 (FOUR) TIMES DAILY. (Patient not taking: Reported on 12/21/2022) 100 g 1   Insulin Disposable Pump (OMNIPOD 5 G6 POD, GEN 5,) MISC 1 Device by Does not apply  route every other day. (Patient not taking: Reported on 12/15/2022) 9 each 3   insulin glargine (LANTUS SOLOSTAR) 100 UNIT/ML Solostar Pen Inject 60 Units into the skin daily. (Patient not taking: Reported on 12/21/2022) 60 mL 3   losartan (COZAAR) 25 MG tablet Take 2 tablets (50 mg total) by mouth daily. (Patient not taking: Reported on 12/21/2022) 90 tablet 3   No current facility-administered medications for this encounter.   Wt Readings from Last 3 Encounters:  12/21/22 96.2 kg (212 lb)  12/15/22 96.6 kg (213 lb)  12/09/22 95.9 kg (211 lb 6.4 oz)   BP (!) 146/82   Pulse 80   Wt 96.2 kg (212 lb)   LMP 11/09/2022 (Approximate)   SpO2 100%   BMI 36.39 kg/m  Physical Exam General:  NAD. No resp difficulty, walked into clinic. HEENT: Normal Neck: Supple. No JVD. Carotids 2+ bilat; no bruits. No lymphadenopathy or thryomegaly appreciated. Cor: PMI nondisplaced.  Regular rate & rhythm. No rubs, gallops or murmurs. Lungs: Clear Abdomen: Obese, nontender, nondistended. No hepatosplenomegaly. No bruits or masses. Good bowel sounds. Extremities: No cyanosis, clubbing, rash, edema; s/p L BKA Neuro: Alert & oriented x 3, cranial nerves grossly intact. Moves all 4 extremities w/o difficulty. Affect pleasant.  Assessment/Plan: 1. Chronic systolic CHF: Echo in 5/80 with EF 40-45%, severe LV hypertrophy.  This was found in association with complete heart block.  Nonischemic cardiomyopathy, cath in 3/21 showed no significant CAD.  PYP scan (6/21) not suggestive of TTR amyloidosis. cMRI (2/23) showed LVEF 68%, severe LVH, ECV 54%, no LVOT gradient, poor nulling with uninterpretable delayed enhancement. CTA chest did not show evidence for pulmonary sarcoidosis.  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  There is wide phenotypic variation in female Fabry's heterozygotes, but I suspect that the cardiomyopathy with LVH  and h/o complete heart block in this situation is most likely due to Fabry's disease. She is not volume overloaded on exam with NYHA class II-III symptoms.  - We will continue to work to get her an appt at Dr. Lelan Pons McDonald's office at [facility]. She will need treatment with Fabrazyme (enzyme replacement).  - Continue hydralazine 25 mg tid + Imdur 20 mg daily.  - Continue Lasix 40 qam/20 qpm. BMET today.  - Continue spironolactone 25 mg daily.  - Continue Coreg 6.25 mg bid. - Continue losartan 50 mg daily. - Echo at followup appt.  2. HTN: Mildly elevated today, but generally well-controlled at home. No changes for now. 3. Hyperlipidemia: Good lipids 5/23. 4. H/o smoking: Quit x 1 month.    - Continue Chantix. 5. Type 2 diabetes: Followed by Endocrinology. She is on insulin. 6. Complete heart block: She has a St Jude PPM and is pacing her RV > 99% of the time.  It is possible that this could be related to Fabry's disease.  7. Obesity: Body mass index is 36.39 kg/m. - Considered referral for GLP1RA, but she has history of pancreatitis. 8. CKD stage 3: Suspect diabetic nephropathy.  This may be related to Fabry's disease as well.  - BMET today.  Follow up next month with Dr. Aundra Dubin + echo, as scheduled.  Maricela Bo [facility] FNP-BC 12/21/2022

## 2022-12-21 ENCOUNTER — Encounter (HOSPITAL_COMMUNITY): Payer: Self-pay

## 2022-12-21 ENCOUNTER — Ambulatory Visit (HOSPITAL_COMMUNITY)
Admission: RE | Admit: 2022-12-21 | Discharge: 2022-12-21 | Disposition: A | Discharge status: Home / Self Care | Payer: Medicaid Other | Source: Ambulatory Visit | Attending: Family Medicine | Admitting: Family Medicine

## 2022-12-21 VITALS — BP 146/82 | HR 80 | Wt 212.0 lb

## 2022-12-21 DIAGNOSIS — Z6836 Body mass index (BMI) 36.0-36.9, adult: Secondary | ICD-10-CM | POA: Insufficient documentation

## 2022-12-21 DIAGNOSIS — I428 Other cardiomyopathies: Secondary | ICD-10-CM | POA: Insufficient documentation

## 2022-12-21 DIAGNOSIS — E785 Hyperlipidemia, unspecified: Secondary | ICD-10-CM | POA: Insufficient documentation

## 2022-12-21 DIAGNOSIS — Z87891 Personal history of nicotine dependence: Secondary | ICD-10-CM | POA: Diagnosis not present

## 2022-12-21 DIAGNOSIS — R0602 Shortness of breath: Secondary | ICD-10-CM | POA: Diagnosis not present

## 2022-12-21 DIAGNOSIS — I10 Essential (primary) hypertension: Secondary | ICD-10-CM

## 2022-12-21 DIAGNOSIS — E1169 Type 2 diabetes mellitus with other specified complication: Secondary | ICD-10-CM

## 2022-12-21 DIAGNOSIS — I130 Hypertensive heart and chronic kidney disease with heart failure and stage 1 through stage 4 chronic kidney disease, or unspecified chronic kidney disease: Secondary | ICD-10-CM | POA: Diagnosis not present

## 2022-12-21 DIAGNOSIS — Z8679 Personal history of other diseases of the circulatory system: Secondary | ICD-10-CM | POA: Diagnosis not present

## 2022-12-21 DIAGNOSIS — R5383 Other fatigue: Secondary | ICD-10-CM | POA: Diagnosis not present

## 2022-12-21 DIAGNOSIS — I442 Atrioventricular block, complete: Secondary | ICD-10-CM

## 2022-12-21 DIAGNOSIS — Z79899 Other long term (current) drug therapy: Secondary | ICD-10-CM | POA: Insufficient documentation

## 2022-12-21 DIAGNOSIS — E669 Obesity, unspecified: Secondary | ICD-10-CM | POA: Insufficient documentation

## 2022-12-21 DIAGNOSIS — Z794 Long term (current) use of insulin: Secondary | ICD-10-CM | POA: Insufficient documentation

## 2022-12-21 DIAGNOSIS — Z8719 Personal history of other diseases of the digestive system: Secondary | ICD-10-CM | POA: Insufficient documentation

## 2022-12-21 DIAGNOSIS — I5022 Chronic systolic (congestive) heart failure: Secondary | ICD-10-CM | POA: Insufficient documentation

## 2022-12-21 DIAGNOSIS — Z720 Tobacco use: Secondary | ICD-10-CM | POA: Diagnosis not present

## 2022-12-21 DIAGNOSIS — E1122 Type 2 diabetes mellitus with diabetic chronic kidney disease: Secondary | ICD-10-CM | POA: Diagnosis not present

## 2022-12-21 DIAGNOSIS — N183 Chronic kidney disease, stage 3 unspecified: Secondary | ICD-10-CM | POA: Insufficient documentation

## 2022-12-21 LAB — BASIC METABOLIC PANEL
Anion gap: 10 (ref 5–15)
BUN: 32 mg/dL — ABNORMAL HIGH (ref 6–20)
CO2: 19 mmol/L — ABNORMAL LOW (ref 22–32)
Calcium: 8.6 mg/dL — ABNORMAL LOW (ref 8.9–10.3)
Chloride: 105 mmol/L (ref 98–111)
Creatinine, Ser: 1.29 mg/dL — ABNORMAL HIGH (ref 0.44–1.00)
GFR, Estimated: 51 mL/min — ABNORMAL LOW (ref >60)
Glucose, Bld: 198 mg/dL — ABNORMAL HIGH (ref 70–99)
Potassium: 4.3 mmol/L (ref 3.5–5.1)
Sodium: 134 mmol/L — ABNORMAL LOW (ref 135–145)

## 2022-12-21 NOTE — Patient Instructions (Addendum)
Thank you for coming in today  Your physician recommends that you schedule a follow-up appointment in:  Keep follow up with Dr. Aundra Dubin  Your [place] study can take months to get a appointment soon as there is availability you will be contacted to schedule a appointment.    Do the following things EVERYDAY: Weigh yourself in the morning before breakfast. Write it down and keep it in a log. Take your medicines as prescribed Eat low salt foods--Limit salt (sodium) to 2000 mg per day.  Stay as active as you can everyday Limit all fluids for the day to less than 2 liters  At the [facility], you and your health needs are our priority. As part of our continuing mission to provide you with exceptional heart care, we have created designated Provider Care Teams. These Care Teams include your primary Cardiologist (physician) and Advanced Practice Providers (APPs- Physician Assistants and Nurse Practitioners) who all work together to provide you with the care you need, when you need it.   You may see any of the following providers on your designated Care Team at your next follow up: Dr Glori Bickers Dr Loralie Champagne Dr. Roxana Hires, NP Lyda Jester, [facility] [place], [place] Forestine Na, NP Audry Riles, PharmD   Please be sure to bring in all your medications bottles to every appointment.   If you have any questions or concerns before your next appointment please send [place] a message through [place] or call our office at [phone].    TO LEAVE A MESSAGE FOR THE NURSE SELECT OPTION 2, PLEASE LEAVE A MESSAGE INCLUDING: YOUR NAME DATE OF BIRTH CALL BACK NUMBER REASON FOR CALL**this is important as we prioritize the call backs  YOU WILL RECEIVE A CALL BACK THE SAME DAY AS LONG AS YOU CALL BEFORE 4:00 PM

## 2022-12-23 ENCOUNTER — Other Ambulatory Visit (HOSPITAL_COMMUNITY): Payer: Self-pay | Admitting: Emergency Medicine

## 2022-12-23 ENCOUNTER — Other Ambulatory Visit (HOSPITAL_COMMUNITY): Payer: Self-pay

## 2022-12-23 NOTE — Progress Notes (Signed)
Paramedicine Encounter    Patient ID: Sarah Long, female    DOB: November 07, 1971, 52 y.o.   MRN: [id number]   Complaints None  Assessment Denies chest pain and SOB. Lung sounds clear and equal. No edema   Compliance with meds   Pill box filled  x 1 week  Refills needed Yes  Meds changes since last visit none    Social changes new home   BP (!) 140/0 Comment: palpation  Pulse 80   LMP 11/09/2022 (Approximate)   SpO2 96%  Weight yesterday-not taken Last visit weight-212lb  Sarah Long in her new home.  Still waiting on appt w/ [facility] 2/13 for furniture.  She has been having issues getting meds due to a mix up w/ her insurance.  Still waiting on reconciliation of this issue.  Had planned on discharge but want to make sure she can get back on track with her meds.  ACTION: Home visit completed  Sarah Long [phone] 12/30/22  Patient Care Team: Pcp, No as PCP - General Sarah Margarita, MD as PCP - Cardiology (Cardiology) Sarah Haw, MD as PCP - Electrophysiology (Cardiology) Sarah Long, RPH-CPP (Pharmacist) Ethelda Chick as Social Worker  Patient Active Problem List   Diagnosis Date Noted   Type 2 diabetes mellitus with microalbuminuria, with long-term current use of insulin ([place]) 12/15/2022   Peripheral vascular disease, unspecified ([place]) 03/22/2022   Type 2 diabetes mellitus with diabetic polyneuropathy, with long-term current use of insulin ([place]) 02/03/2022   Type 2 diabetes mellitus with hyperglycemia, with long-term current use of insulin ([place]) 02/03/2022   Microalbuminuria 02/03/2022   Homelessness 11/19/2021   Right great toe amputee ([place]) 11/03/2021   Class 2 severe obesity due to excess calories with serious comorbidity and body mass index (BMI) of 36.0 to 36.9 in adult ([place]) 10/15/2021   Insomnia 10/07/2021   Left below-knee amputee ([place]) 07/01/2021   Normocytic anemia 06/26/2021   Heart failure with mid-range ejection  fraction ([place]) 06/26/2021   Diabetic nephropathy with proteinuria ([place]) 06/26/2021   Dyspnea    Dental caries 04/01/2020   Cracked tooth 04/01/2020   Alcohol use 03/11/2020   Vitamin D deficiency 03/11/2020   Prolonged QT interval 02/26/2020   Chronic combined systolic and diastolic CHF (congestive heart failure) ([place]) 02/26/2020   Heart block AV complete ([place]) 02/11/2020   Exocrine pancreatic insufficiency    Dysmenorrhea 10/02/2019   DM type 2 with diabetic peripheral neuropathy ([place]) 05/30/2019   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 block) 05/29/2019   Former smoker 11/26/2013   Hypercholesteremia 02/02/2007   Bipolar 1 disorder ([place]) 02/02/2007   HYPERTENSION, BENIGN SYSTEMIC 02/02/2007    Current Outpatient Medications:    acetaminophen (TYLENOL) 325 MG tablet, Take 650 mg by mouth every 6 (six) hours as needed (pain)., Disp: , Rfl:    atorvastatin (LIPITOR) 20 MG tablet, Take 1 tablet (20 mg total) by mouth daily., Disp: 90 tablet, Rfl: 3   carvedilol (COREG) 6.25 MG tablet, Take 1 tablet (6.25 mg total) by mouth 2 (two) times daily with a meal., Disp: 180 tablet, Rfl: 3   Continuous Blood Gluc Sensor (DEXCOM G6 SENSOR) MISC, 1 Device by Does not apply route as directed., Disp: 9 each, Rfl: 3   furosemide (LASIX) 20 MG tablet, Take 2 tablets (40 mg total) by mouth every morning AND 1 tablet (20 mg total) every evening., Disp: 90 tablet, Rfl: 2   Insulin Disposable Pump (OMNIPOD 5 G6  POD, GEN 5,) MISC, 1 Device by Does not apply route every other day., Disp: 9 each, Rfl: 3   insulin lispro (HUMALOG) 100 UNIT/ML injection, Max daily 125 units a day per pump, Disp: 40 mL, Rfl: 11   Insulin Pen Needle 31G X 5 MM MISC, Use as directed in the morning, at noon, in the evening, and at bedtime., Disp: 400 each, Rfl: 3   latanoprost (XALATAN) 0.005 % ophthalmic solution, Place 1 drop into both eyes at bedtime., Disp: , Rfl:    losartan (COZAAR) 25 MG tablet, Take 2  tablets (50 mg total) by mouth daily., Disp: 90 tablet, Rfl: 3   Varenicline Tartrate, Starter, (CHANTIX STARTING MONTH PAK) 0.5 MG X 11 & 1 MG X 42 TBPK, As directed, Disp: 53 each, Rfl: 0   Blood Pressure Monitoring (BLOOD PRESSURE MONITOR 7) DEVI, Measure blood pressure daily (Patient not taking: Reported on 12/23/2022), Disp: 1 each, Rfl: 0   Continuous Blood Gluc Transmit (DEXCOM G6 TRANSMITTER) MISC, USE AS DIRECTED, Disp: 1 each, Rfl: 3   diclofenac Sodium (VOLTAREN) 1 % GEL, APPLY 2 GRAMS TOPICALLY 4 (FOUR) TIMES DAILY. (Patient not taking: Reported on 12/21/2022), Disp: 100 g, Rfl: 1   hydrALAZINE (APRESOLINE) 25 MG tablet, Take 1 tablet (25 mg total) by mouth 3 (three) times daily. (Patient not taking: Reported on 12/23/2022), Disp: 270 tablet, Rfl: 3   insulin glargine (LANTUS SOLOSTAR) 100 UNIT/ML Solostar Pen, Inject 60 Units into the skin daily. (Patient not taking: Reported on 12/21/2022), Disp: 60 mL, Rfl: 3   isosorbide mononitrate (ISMO) 20 MG tablet, Take 1 tablet (20 mg total) by mouth daily. (Patient not taking: Reported on 12/23/2022), Disp: 90 tablet, Rfl: 3   spironolactone (ALDACTONE) 25 MG tablet, Take 1 tablet (25 mg total) by mouth daily. (Patient not taking: Reported on 12/23/2022), Disp: 90 tablet, Rfl: 3 Allergies  Allergen Reactions   Lamictal [Lamotrigine] Itching   Strawberry Extract Hives   Sulfa Antibiotics Hives and Itching     Social History   Socioeconomic History   Marital status: Significant Other    Spouse name: Not on file   Number of children: Not on file   Years of education: Not on file   Highest education level: Not on file  Occupational History   Not on file  Tobacco Use   Smoking status: Every Day    Packs/day: 0.50    Years: 20.00    Total pack years: 10.00    Types: Cigarettes    Last attempt to quit: 07/01/2021    Years since quitting: 1.4   Smokeless tobacco: Never  Vaping Use   Vaping Use: Never used  Substance and Sexual Activity    Alcohol use: No   Drug use: No   Sexual activity: Yes    Birth control/protection: None  Other Topics Concern   Not on file  Social History Narrative   Not on file   Social Determinants of Health   Financial Resource Strain: High Risk (11/25/2022)   Overall Financial Resource Strain (CARDIA)    Difficulty of Paying Living Expenses: Very hard  Food Insecurity: Food Insecurity Present (11/01/2022)   Hunger Vital Sign    Worried About Running Out of Food in the Last Year: Often true    Ran Out of Food in the Last Year: Often true  Transportation Needs: Unmet Transportation Needs (04/01/2022)   PRAPARE - Hydrologist (Medical): Yes    Lack of Transportation (Non-Medical):  Yes  Physical Activity: Not on file  Stress: Not on file  Social Connections: Not on file  Intimate Partner Violence: Not on file         Future Appointments  Date Time Provider [place]  01/13/2023  9:50 AM [name], MD CHW-CHWW None  01/25/2023  6:10 PM CVD-[facility] DEVICE REMOTES [facility]  01/31/2023  8:00 AM MC ECHO OP 1 MC-ECHOL[facility]  01/31/2023  9:00 AM Larey Dresser, MD MC-HVSC None  03/14/2023 10:00 AM Raulkar, Clide Deutscher, MD CPR-PRMA CPR  06/30/2023  7:30 AM Shamleffer, Melanie Crazier, MD LBPC-LBENDO None

## 2022-12-24 ENCOUNTER — Other Ambulatory Visit: Payer: Self-pay

## 2022-12-24 DIAGNOSIS — G4719 Other hypersomnia: Secondary | ICD-10-CM

## 2022-12-27 ENCOUNTER — Other Ambulatory Visit: Payer: Self-pay | Admitting: Internal Medicine

## 2022-12-29 ENCOUNTER — Telehealth (HOSPITAL_COMMUNITY): Payer: Self-pay | Admitting: Licensed Clinical Social Worker

## 2022-12-29 NOTE — Telephone Encounter (Signed)
H&V Care Navigation CSW Progress Note  Clinical Social Worker called pt to check in.  Pt reports she is doing ok liking her new home and waiting for [facility] appt on 2/13 to get furniture.  Pt states she has been without diabetic medications for about 3-4 weeks cause of a mix up with her insurance leading to her having some kind of commercial plan in addition to her medicaid plan- working on fixing this but currently having poor medication coverage- diabetic supplies will cost $50 which she can't afford right now- CSW offered to provide gift cards to assist- she will possibly come in tomorrow to obtain.  No other concerns at this time- awaiting disability determination.  Patient is participating in a Managed Medicaid Plan:  Yes  [place]: Food Insecurity Present (11/01/2022)  Housing: High Risk (09/07/2022)  Transportation Needs: Unmet Transportation Needs (04/01/2022)  Depression (PHQ2-9): Low Risk  (11/11/2022)  Financial Resource Strain: High Risk (11/25/2022)  Tobacco Use: High Risk (12/21/2022)   Jorge Ny, [facility] Desk#: [phone] Cell#: [phone]

## 2022-12-30 ENCOUNTER — Telehealth (HOSPITAL_COMMUNITY): Payer: Self-pay | Admitting: Licensed Clinical Social Worker

## 2022-12-30 NOTE — Telephone Encounter (Signed)
H&V Care Navigation CSW Progress Note  Clinical Social Worker assisted pt in paying for medications with gift cards  Patient is participating in a Managed Medicaid Plan:  Yes  [place]: Food Insecurity Present (11/01/2022)  Housing: High Risk (09/07/2022)  Transportation Needs: Unmet Transportation Needs (04/01/2022)  Depression (PHQ2-9): Low Risk  (11/11/2022)  Financial Resource Strain: High Risk (11/25/2022)  Tobacco Use: High Risk (12/21/2022)     Jorge Ny, LCSW Clinical Social Worker [facility] Desk#: [phone] Cell#: [phone]

## 2023-01-07 ENCOUNTER — Other Ambulatory Visit (HOSPITAL_COMMUNITY): Payer: Self-pay | Admitting: Emergency Medicine

## 2023-01-07 ENCOUNTER — Other Ambulatory Visit: Payer: Self-pay

## 2023-01-07 NOTE — Progress Notes (Signed)
Paramedicine Encounter    Patient ID: Sarah Long, female    DOB: [birth year], 52 y.o.   MRN: [id number]   Complaints No complaints   Assessment A&O x 4, no chest pain, edema or SOB.    Compliance with meds Compliant with some med gaps  Pill box filled x 1 week  Refills needed Hydralazine, Isosorbide, Spironolactone  Meds changes since last visit none    Social changes: Out of hotel, living in a house now w/ children   BP (!) 160/80 (BP Location: Left Arm, Patient Position: Sitting, Cuff Size: Normal)   Pulse 89   Resp 16   Wt 213 lb 6.4 oz (96.8 kg)   SpO2 99%   BMI 36.63 kg/m  Weight yesterday-not taken Last visit weight-212lb  Ms. Watling continues having issues getting her medications due to a mix up with her insurance status.  I reconciled her meds x 1 week.  I was able to reach out to [employer] and speak with someone there who assisted me in getting the appropriate information and gave an override code so that she can get all of her prescriptions.  I re-contacted her to let her know that she should be able to pick up her meds this afternoon and she advised she would contact the pharmacy to confirm they would be ready and she will text me to let me know everything went through.  ACTION: Home visit completed  Skipper Cliche [phone] 01/07/23  Patient Care Team: Pcp, No as PCP - General Sueanne Margarita, MD as PCP - Cardiology (Cardiology) Constance Haw, MD as PCP - Electrophysiology (Cardiology) Osker Mason, RPH-CPP (Pharmacist) Ethelda Chick as Social Worker  Patient Active Problem List   Diagnosis Date Noted   Type 2 diabetes mellitus with microalbuminuria, with long-term current use of insulin ([place]) 12/15/2022   Peripheral vascular disease, unspecified ([place]) 03/22/2022   Type 2 diabetes mellitus with diabetic polyneuropathy, with long-term current use of insulin ([place]) 02/03/2022   Type 2 diabetes mellitus with hyperglycemia,  with long-term current use of insulin ([place]) 02/03/2022   Microalbuminuria 02/03/2022   Homelessness 11/19/2021   Right great toe amputee ([place]) 11/03/2021   Class 2 severe obesity due to excess calories with serious comorbidity and body mass index (BMI) of 36.0 to 36.9 in adult [facility]) 10/15/2021   Insomnia 10/07/2021   Left below-knee amputee ([place]) 07/01/2021   Normocytic anemia 06/26/2021   Heart failure with mid-range ejection fraction ([place]) 06/26/2021   Diabetic nephropathy with proteinuria ([place]) 06/26/2021   Dyspnea    Dental caries 04/01/2020   Cracked tooth 04/01/2020   Alcohol use 03/11/2020   Vitamin D deficiency 03/11/2020   Prolonged QT interval 02/26/2020   Chronic combined systolic and diastolic CHF (congestive heart failure) ([place]) 02/26/2020   Heart block AV complete ([place]) 02/11/2020   Exocrine pancreatic insufficiency    Dysmenorrhea 10/02/2019   DM type 2 with diabetic peripheral neuropathy ([place]) 05/30/2019   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 block) 05/29/2019   Former smoker 11/26/2013   Hypercholesteremia 02/02/2007   Bipolar 1 disorder ([place]) 02/02/2007   HYPERTENSION, BENIGN SYSTEMIC 02/02/2007    Current Outpatient Medications:    atorvastatin (LIPITOR) 20 MG tablet, Take 1 tablet (20 mg total) by mouth daily., Disp: 90 tablet, Rfl: 3   carvedilol (COREG) 6.25 MG tablet, Take 1 tablet (6.25 mg total) by mouth 2 (two) times daily with a meal., Disp: 180 tablet, Rfl: 3  Continuous Blood Gluc Sensor (DEXCOM G6 SENSOR) MISC, 1 Device by Does not apply route as directed., Disp: 9 each, Rfl: 3   Continuous Blood Gluc Transmit (DEXCOM G6 TRANSMITTER) MISC, USE AS DIRECTED, Disp: 1 each, Rfl: 3   furosemide (LASIX) 20 MG tablet, Take 2 tablets (40 mg total) by mouth every morning AND 1 tablet (20 mg total) every evening., Disp: 90 tablet, Rfl: 2   insulin lispro (HUMALOG) 100 UNIT/ML injection, Max daily 125 units a day per pump, Disp: 40 mL,  Rfl: 11   Insulin Pen Needle 31G X 5 MM MISC, Use as directed in the morning, at noon, in the evening, and at bedtime., Disp: 400 each, Rfl: 3   latanoprost (XALATAN) 0.005 % ophthalmic solution, Place 1 drop into both eyes at bedtime., Disp: , Rfl:    losartan (COZAAR) 25 MG tablet, Take 2 tablets (50 mg total) by mouth daily., Disp: 90 tablet, Rfl: 3   acetaminophen (TYLENOL) 325 MG tablet, Take 650 mg by mouth every 6 (six) hours as needed (pain)., Disp: , Rfl:    Blood Pressure Monitoring (BLOOD PRESSURE MONITOR 7) DEVI, Measure blood pressure daily (Patient not taking: Reported on 12/23/2022), Disp: 1 each, Rfl: 0   diclofenac Sodium (VOLTAREN) 1 % GEL, APPLY 2 GRAMS TOPICALLY 4 (FOUR) TIMES DAILY. (Patient not taking: Reported on 12/21/2022), Disp: 100 g, Rfl: 1   hydrALAZINE (APRESOLINE) 25 MG tablet, Take 1 tablet (25 mg total) by mouth 3 (three) times daily. (Patient not taking: Reported on 12/23/2022), Disp: 270 tablet, Rfl: 3   Insulin Disposable Pump (OMNIPOD 5 G6 POD, GEN 5,) MISC, 1 Device by Does not apply route every other day. (Patient not taking: Reported on 01/07/2023), Disp: 9 each, Rfl: 3   insulin glargine (LANTUS SOLOSTAR) 100 UNIT/ML Solostar Pen, Inject 60 Units into the skin daily. (Patient not taking: Reported on 12/21/2022), Disp: 60 mL, Rfl: 3   isosorbide mononitrate (ISMO) 20 MG tablet, Take 1 tablet (20 mg total) by mouth daily. (Patient not taking: Reported on 12/23/2022), Disp: 90 tablet, Rfl: 3   spironolactone (ALDACTONE) 25 MG tablet, Take 1 tablet (25 mg total) by mouth daily. (Patient not taking: Reported on 12/23/2022), Disp: 90 tablet, Rfl: 3   Varenicline Tartrate, Starter, (CHANTIX STARTING MONTH PAK) 0.5 MG X 11 & 1 MG X 42 TBPK, As directed, Disp: 53 each, Rfl: 0 Allergies  Allergen Reactions   Lamictal [Lamotrigine] Itching   Strawberry Extract Hives   Sulfa Antibiotics Hives and Itching     Social History   Socioeconomic History   Marital status:  Significant Other    Spouse name: Not on file   Number of children: Not on file   Years of education: Not on file   Highest education level: Not on file  Occupational History   Not on file  Tobacco Use   Smoking status: Every Day    Packs/day: 0.50    Years: 20.00    Total pack years: 10.00    Types: Cigarettes    Last attempt to quit: 07/01/2021    Years since quitting: 1.5   Smokeless tobacco: Never  Vaping Use   Vaping Use: Never used  Substance and Sexual Activity   Alcohol use: No   Drug use: No   Sexual activity: Yes    Birth control/protection: None  Other Topics Concern   Not on file  Social History Narrative   Not on file   Social Determinants of Health   Financial  Resource Strain: High Risk (11/25/2022)   Overall Financial Resource Strain (CARDIA)    Difficulty of Paying Living Expenses: Very hard  Food Insecurity: Food Insecurity Present (11/01/2022)   Hunger Vital Sign    Worried About Running Out of Food in the Last Year: Often true    Ran Out of Food in the Last Year: Often true  Transportation Needs: Unmet Transportation Needs (04/01/2022)   PRAPARE - Hydrologist (Medical): Yes    Lack of Transportation (Non-Medical): Yes  Physical Activity: Not on file  Stress: Not on file  Social Connections: Not on file  Intimate Partner Violence: Not on file    Physical Exam      Future Appointments  Date Time Provider [place]  01/13/2023  9:50 AM [name], MD CHW-CHWW None  01/25/2023  6:10 PM CVD-[facility] DEVICE REMOTES [facility]  01/31/2023  8:00 AM MC ECHO OP 1 MC[facility]ECHOLAB [facility]  01/31/2023  9:00 AM Larey Dresser, MD MC-HVSC None  03/14/2023 10:00 AM Raulkar, Clide Deutscher, MD CPR-PRMA CPR  06/30/2023  7:30 AM Shamleffer, Melanie Crazier, MD LBPC-LBENDO None

## 2023-01-10 ENCOUNTER — Telehealth (HOSPITAL_COMMUNITY): Payer: Self-pay | Admitting: Licensed Clinical Social Worker

## 2023-01-10 NOTE — Telephone Encounter (Signed)
H&V Care Navigation CSW Progress Note  Clinical Social Worker called by patient to ask for assistance with deposit for [employer]- they added $320 to first bill for deposit which is too much for pt to pay for along with high energy bill.  CSW assisted with paying for deposit.  Patient is participating in a Managed Medicaid Plan:  Yes  [place]: Food Insecurity Present (11/01/2022)  Housing: High Risk (09/07/2022)  Transportation Needs: Unmet Transportation Needs (04/01/2022)  Depression (PHQ2-9): Low Risk  (11/11/2022)  Financial Resource Strain: High Risk (01/10/2023)  Tobacco Use: High Risk (12/21/2022)    Jorge Ny, [facility] Desk#: [phone] Cell#: [phone]

## 2023-01-13 ENCOUNTER — Telehealth: Payer: Self-pay | Admitting: Pharmacist

## 2023-01-13 ENCOUNTER — Other Ambulatory Visit: Payer: Self-pay

## 2023-01-13 ENCOUNTER — Other Ambulatory Visit: Payer: Self-pay | Admitting: Pharmacist

## 2023-01-13 ENCOUNTER — Encounter: Payer: Self-pay | Admitting: Critical Care Medicine

## 2023-01-13 ENCOUNTER — Ambulatory Visit: Payer: Medicaid Other | Attending: Critical Care Medicine | Admitting: Critical Care Medicine

## 2023-01-13 ENCOUNTER — Other Ambulatory Visit (HOSPITAL_COMMUNITY)
Admission: RE | Admit: 2023-01-13 | Discharge: 2023-01-13 | Disposition: A | Discharge status: Home / Self Care | Payer: Medicaid Other | Source: Ambulatory Visit | Attending: Critical Care Medicine | Admitting: Critical Care Medicine

## 2023-01-13 VITALS — BP 166/103 | HR 80 | Wt 216.6 lb

## 2023-01-13 DIAGNOSIS — Z794 Long term (current) use of insulin: Secondary | ICD-10-CM

## 2023-01-13 DIAGNOSIS — E1165 Type 2 diabetes mellitus with hyperglycemia: Secondary | ICD-10-CM

## 2023-01-13 DIAGNOSIS — N898 Other specified noninflammatory disorders of vagina: Secondary | ICD-10-CM

## 2023-01-13 DIAGNOSIS — Z5900 Homelessness unspecified: Secondary | ICD-10-CM

## 2023-01-13 DIAGNOSIS — E559 Vitamin D deficiency, unspecified: Secondary | ICD-10-CM

## 2023-01-13 DIAGNOSIS — D509 Iron deficiency anemia, unspecified: Secondary | ICD-10-CM

## 2023-01-13 DIAGNOSIS — E1142 Type 2 diabetes mellitus with diabetic polyneuropathy: Secondary | ICD-10-CM | POA: Diagnosis not present

## 2023-01-13 DIAGNOSIS — R300 Dysuria: Secondary | ICD-10-CM | POA: Insufficient documentation

## 2023-01-13 DIAGNOSIS — E7800 Pure hypercholesterolemia, unspecified: Secondary | ICD-10-CM | POA: Diagnosis not present

## 2023-01-13 DIAGNOSIS — R809 Proteinuria, unspecified: Secondary | ICD-10-CM

## 2023-01-13 DIAGNOSIS — I5042 Chronic combined systolic (congestive) and diastolic (congestive) heart failure: Secondary | ICD-10-CM

## 2023-01-13 DIAGNOSIS — I10 Essential (primary) hypertension: Secondary | ICD-10-CM

## 2023-01-13 MED ORDER — ATORVASTATIN CALCIUM 20 MG PO TABS: 20.0000 mg | ORAL_TABLET | Freq: Every day | ORAL | 3 refills | Status: DC

## 2023-01-13 MED ORDER — CARVEDILOL 12.5 MG PO TABS: 12.5000 mg | ORAL_TABLET | Freq: Two times a day (BID) | ORAL | 1 refills | Status: DC

## 2023-01-13 MED ORDER — SPIRONOLACTONE 25 MG PO TABS: 25.0000 mg | ORAL_TABLET | Freq: Every day | ORAL | 3 refills | Status: DC

## 2023-01-13 MED ORDER — FUROSEMIDE 20 MG PO TABS: ORAL_TABLET | ORAL | 2 refills | Status: DC

## 2023-01-13 MED ORDER — DEXCOM G6 TRANSMITTER MISC: 3 refills | Status: DC

## 2023-01-13 MED ORDER — DEXCOM G6 SENSOR MISC: 3 refills | Status: DC

## 2023-01-13 MED ORDER — VARENICLINE TARTRATE 1 MG PO TABS: 1.0000 mg | ORAL_TABLET | Freq: Two times a day (BID) | ORAL | 1 refills | Status: DC

## 2023-01-13 MED ORDER — LOSARTAN POTASSIUM 100 MG PO TABS: 100.0000 mg | ORAL_TABLET | Freq: Every day | ORAL | 2 refills | Status: DC

## 2023-01-13 NOTE — Assessment & Plan Note (Signed)
Continue supplement

## 2023-01-13 NOTE — Progress Notes (Signed)
Established Patient Office Visit  Subjective:  Patient ID: Sarah Long, female    DOB: 07/21/1971  Age: 52 y.o. MRN: [id number]  CC:  Chief Complaint  Patient presents with   Medication Problem    Patient has been having problems getting most or all her medication with her pharmacy/insurance    Shortness of Breath    HPI 11/2021 Sarah Long presents for primary care follow-up and diabetes management  Since last seen in October the patient has continued improvement.  She has been followed closely by endocrinology and her A1c is now down to 7.4.  It had been greater than 12.  The patient also now has a Dexcom glucometer and has showing data suggesting she is staying in the 100-150 range of her blood sugars.  The patient has had a left below-knee amputation now has a functional orthotic.  Patient is followed by rehab.  Patient's phantom limb pain is being managed by the rehab physician and she states is markedly better.  She has had 1 toe amputated on the right foot.  Recently developed a sore internally in her mouth.  This patient has poor dentition and needs to be seen by dentist.  Also she is living in a hotel and may be losing her housing soon and needs assistance in this regard.  Blood pressure is good on arrival 132/87  4/17 Patient seen in return follow-up unfortunately not been taking her oral medications because she cannot afford the co-pay.  She is taking her insulin products.  On arrival blood pressure is elevated 145/102.  She does have a Dexcom meter and recently saw endocrinology.  Below is documentation from that visit.  Endo 02/2022 Today (02/03/22): Sarah Long is here for a follow up on diabetes management. She checks her  blood sugars multiple times daily through CGM.  The patient has  had hypoglycemic episodes since the last clinic visit, which typically occur rarely and after a bolus.     She has made lifestyle changes with weight loss  Has noted prosthetics    HOME  DIABETES REGIMEN: Lantus 35 units daily - takes 45  Humalog 10 units TID QAC- 10-15 units  CF: Humalog (BG -130/25)      Statin: no ACE-I/ARB: no Prior Diabetic Education: yes  Glycemic patterns summary: BG's within goal at night, high during the day    Hyperglycemic episodes postprandial    Hypoglycemic episodes occurred post bolus   Overnight periods: trends down       DIABETIC COMPLICATIONS: Microvascular complications:  Left BKA, Right  great toe amputation , neuropathy  Denies: CKD, retinopathy  Last eye exam: Completed 01/2021   Macrovascular complications:  PAD Denies: CAD, CVA   1) Type 2 Diabetes Mellitus, Sub-Optimally controlled, With neuropathic complications, S/P Left BKA and Right great toe amputation  - Most recent A1c of 7.9 %. Goal A1c < 7.0 %.       - A1c remains above goal - She unfortunately has self adjusted her insulin regimen, it appears that she has not been using the correction scale, she does admit to guessing on prandial dose has occasional hypoglycemia -We discussed the importance of following instructions so that we make the appropriate changes were necessary -In review of her CGM, I am going to increase her basal as well as prandial dose, she will be provided with a new correction scale - She has a hx of Pancreatitis , not a candidate for DPP 4 inhibitors and GLP-1 agonist -  Has hx of DKA , would avoid SGLT2 inhibitors       MEDICATIONS: Increase Lantus to 50 units daily  Take Humalog 12 units TIDQAC Correction Factor: Humalog (BG -130/25)    EDUCATION / INSTRUCTIONS: BG monitoring instructions: Patient is instructed to check her blood sugars 3 times a day, before meals . Call [facility] if: BG persistently < 70  I reviewed the Rule of 15 for the treatment of hypoglycemia in detail with the patient. Literature supplied.     2) Diabetic complications:  Eye: Does not have known diabetic retinopathy.  Neuro/ Feet: Does   have known diabetic peripheral neuropathy. Renal: Patient does not have known baseline CKD. She is not on an ACEI/ARB at present.     3) Dyslipidemia :     - She is not on atorvastatin, patient does not like taking medication and would rather use natural route if possible -We discussed the increased risk of cardiovascular disease as well as CVA with hyperlipidemia in the setting of diabetes   4) Microalbuminuria:       -She has losartan listed but she is not taking it.  We discussed the renal benefits with ARB and I have encouraged her to consider taking it   Medication Restart losartan 25 mg daily   Patient never restarted the losartan or atorvastatin because she cannot afford the $4 co-pay.  She is using insulin as prescribed.  She still living in a motel with her children but is having difficulty affording the rent.  9/5 Patient seen in return follow-up and is followed by cardiology closely for her cardiomyopathy nonischemic.  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  She is undergoing genetic testing for this.  She does have a pacemaker in place.  Cardiac medicines have been adjusted by cardiology.  Patient is also followed by endocrinology and she is on 70 units of Lantus and a sliding scale Humalog average dose of 14 units before meals.  She is trying to get set up for the insulin pump but is yet to achieve this.  She was seen by ophthalmology in April of this year and found to not have diabetic eye disease.  She is due a colon cancer screening and agrees to receive the Cologuard kit.  She needs dental care follow-up.  Also needs mental health follow-up.  She cannot take Latuda because of her cardiac disease and had itching with Lamictal.  She is due a flu vaccine and agrees to receive this is also due foot exam at this visit  01/13/23 Patient is seen in return follow-up has not been seen since last September.  Patient's had difficulty with her insurance partially  approving some of her diabetic supplies including her insulin pump and Dexcom 6.  She has been followed by endocrinology documentation of the last visit is as below.  She still has elevated A1c.  She is on long and short acting insulin at this time.  She is not a candidate for a GLP-1 because of pancreatitis in the past.  Patient does have constipation as well.  She was off cigarettes with use of Chantix she ran out of Chantix now she is back smoking.  Also has mild excess bleeding and cervical vaginal discharge she may yet need gynecology referral.  She also is having some dysuria.  She has a blood pressure meter at home but does not take the blood pressure.  Blood pressure today on arrival is elevated 166/103. Patient is  now out of the hotel living in a home.  Her children are with her.  Below is a recent endocrinology note 1) Type 2 Diabetes Mellitus, Poorly  controlled, With neuropathic complications, S/P Left BKA and Right great toe amputation  - Most recent A1c of 8.8 %. Goal A1c < 7.0 %.       - A1c remains above goal - She has a hx of Pancreatitis , not a candidate for DPP 4 inhibitors and GLP-1 agonist - Has hx of DKA , would avoid SGLT2 inhibitors -She has not been on the pump for the past 3 weeks due to insurance coverage, she is working with her current insurance and will be able to eventually stay on Medicaid and will obtain the OmniPod pump.  Patient liked the pump and would like to stay on it -Despite her elevated A1c, we have been able to download her pump through December, her average BG's at the time 142 mg/DL, within range BG's have improved from 26% to 74%.  And hyperglycemia has improved from 44% down to 20%. -Overall if she is able to continue on the pump this will be a better option, the patient was advised that if she has no Dexcom she should continue to check glucose before each meal and entered into the pump while in the manual mode -I also emphasized the importance of being on  basal insulin if she is not going to be on the pump for 24 hours or more -A refill of the Lantus was prescribed to use when she is of the pump, patient was advised to continue to use Humalog 14 units with each meal -Unable to make any changes to the pump settings as the patient did not bring her PDM     MEDICATIONS:   -Take Lantus 60 units daily while of OmniPod -Continue Humalog 14 units with each meal -Patient will return to the current pump settings once the insurance issues have been resolved   Below is recent cardiology note the patient appears to have a form of amyloidosis and is undergoing consideration for treatment at [facility] 1. Chronic systolic CHF: Echo in 9/16 with EF 40-45%, severe LV hypertrophy.  This was found in association with complete heart block.  Nonischemic cardiomyopathy, cath in 3/21 showed no significant CAD.  PYP scan (6/21) not suggestive of TTR amyloidosis. cMRI (2/23) showed LVEF 68%, severe LVH, ECV 54%, no LVOT gradient, poor nulling with uninterpretable delayed enhancement. CTA chest did not show evidence for pulmonary sarcoidosis.  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  There is wide phenotypic variation in female Fabry's heterozygotes, but I suspect that the cardiomyopathy with LVH and h/o complete heart block in this situation is most likely due to Fabry's disease. She is not volume overloaded on exam with NYHA class II-III symptoms.  - We will continue to work to get her an appt at Dr. Lelan Pons McDonald's office at [facility]. She will need treatment with Fabrazyme (enzyme replacement).  - Continue hydralazine 25 mg tid + Imdur 20 mg daily.  - Continue Lasix 40 qam/20 qpm. BMET today.  - Continue spironolactone 25 mg daily.  - Continue Coreg 6.25 mg bid. - Continue losartan 50 mg daily. - Echo at followup appt.  2. HTN: Mildly elevated today, but generally well-controlled at  home. No changes for now. 3. Hyperlipidemia: Good lipids 5/23. 4. H/o smoking: Quit x 1 month.    -  Continue Chantix. 5. Type 2 diabetes: Followed by Endocrinology. She is on insulin. 6. Complete heart block: She has a [employer] PPM and is pacing her RV > 99% of the time.  It is possible that this could be related to Fabry's disease.  7. Obesity: Body mass index is 36.39 kg/m. - Considered referral for GLP1RA, but she has history of pancreatitis. 8. CKD stage 3: Suspect diabetic nephropathy.  This may be related to Fabry's disease as well.  - BMET today.   Past Medical History:  Diagnosis Date   Abnormal Pap smear 1998   Abnormal vaginal bleeding 12/19/2011   Acute urinary retention 06/26/2021   Arthritis    Bacterial infection    Bipolar 1 disorder (HCC)    Blister of second toe of left foot 11/21/2016   Candida vaginitis 07/2007   Depression    recently added wellbutrin-has not taken yet for bipolar   Diabetes in pregnancy    Diabetes mellitus    nph 20U qam and qpm, regular with meals   Diabetic ketoacidosis without coma associated with type 2 diabetes mellitus ([place])    Fibroid    Galactorrhea of right breast 2008   GERD (gastroesophageal reflux disease)    H/O amenorrhea 07/2007   H/O dizziness 10/14/2011   H/O dysmenorrhea 2010   H/O menorrhagia 10/14/2011   H/O varicella    Headache(784.0)    Heavy vaginal bleeding due to contraceptive injection use 10/12/2011   Depo Provera   Herpes    Homelessness 11/19/2021   HSV-2 infection 01/03/2009   Hx: UTI (urinary tract infection) 2009   Hypertension    on aldomet   Hypoalbuminemia due to protein-calorie malnutrition ([place]) 06/26/2021   Increased BMI 2010   Irregular uterine bleeding 04/04/2012   Pt has mirena    Obesity 10/14/2011   Oligomenorrhea 07/2007   Osteomyelitis of great toe of right foot ([place]) 03/22/2022   Pelvic pain in female    Presence of permanent cardiac pacemaker    Preterm labor    Trichomonas     Yeast infection     Past Surgical History:  Procedure Laterality Date   ABDOMINAL AORTOGRAM W/LOWER EXTREMITY N/A 06/17/2021   Procedure: ABDOMINAL AORTOGRAM W/LOWER EXTREMITY;  Surgeon: Wellington Hampshire, MD;  Location: [facility];  Service: Cardiovascular;  Laterality: N/A;   AMPUTATION Left 06/27/2021   Procedure: AMPUTATION BELOW KNEE;  Surgeon: Newt Minion, MD;  Location: [facility];  Service: Orthopedics;  Laterality: Left;   AMPUTATION Right 09/04/2021   Procedure: RIGHT GREAT TOE AMPUTATION;  Surgeon: Newt Minion, MD;  Location: [facility];  Service: Orthopedics;  Laterality: Right;   CESAREAN SECTION  1991   LEFT HEART CATH AND CORONARY ANGIOGRAPHY N/A 02/10/2020   Procedure: LEFT HEART CATH AND CORONARY ANGIOGRAPHY;  Surgeon: Troy Sine, MD;  Location: [facility];  Service: Cardiovascular;  Laterality: N/A;   PACEMAKER IMPLANT N/A 02/13/2020   Procedure: PACEMAKER IMPLANT;  Surgeon: Constance Haw, MD;  Location: [place] [facility];  Service: Cardiovascular;  Laterality: N/A;   TEMPORARY PACEMAKER N/A 02/10/2020   Procedure: TEMPORARY PACEMAKER;  Surgeon: Troy Sine, MD;  Location: [facility];  Service: Cardiovascular;  Laterality: N/A;    Family History  Problem Relation Age of Onset   Hypertension Mother    Diabetes Mother    Heart disease Mother    Hypertension Father    Diabetes Father    Heart disease Father  Stroke Maternal Grandfather    Other Neg Hx    Breast cancer Neg Hx     Social History   Socioeconomic History   Marital status: Significant Other    Spouse name: Not on file   Number of children: Not on file   Years of education: Not on file   Highest education level: Not on file  Occupational History   Not on file  Tobacco Use   Smoking status: Every Day    Packs/day: 0.50    Years: 20.00    Total pack years: 10.00    Types: Cigarettes    Last attempt to quit: 07/01/2021    Years since quitting: 1.5   Smokeless  tobacco: Never  Vaping Use   Vaping Use: Never used  Substance and Sexual Activity   Alcohol use: No   Drug use: No   Sexual activity: Yes    Birth control/protection: None  Other Topics Concern   Not on file  Social History Narrative   Not on file   Social Determinants of Health   Financial Resource Strain: High Risk (01/10/2023)   Overall Financial Resource Strain (CARDIA)    Difficulty of Paying Living Expenses: Hard  Food Insecurity: Food Insecurity Present (11/01/2022)   Hunger Vital Sign    Worried About Charity fundraiser in the Last Year: Often true    Ran Out of Food in the Last Year: Often true  Transportation Needs: Unmet Transportation Needs (04/01/2022)   PRAPARE - Hydrologist (Medical): Yes    Lack of Transportation (Non-Medical): Yes  Physical Activity: Not on file  Stress: Not on file  Social Connections: Not on file  Intimate Partner Violence: Not on file    Outpatient Medications Prior to Visit  Medication Sig Dispense Refill   acetaminophen (TYLENOL) 325 MG tablet Take 650 mg by mouth every 6 (six) hours as needed (pain).     acetaminophen-codeine (TYLENOL #3) 300-30 MG tablet Take 1 tablet by mouth every 6 (six) hours.     Blood Pressure Monitoring (BLOOD PRESSURE MONITOR 7) DEVI Measure blood pressure daily 1 each 0   hydrALAZINE (APRESOLINE) 25 MG tablet Take 1 tablet (25 mg total) by mouth 3 (three) times daily. 270 tablet 3   Insulin Disposable Pump (OMNIPOD 5 G6 POD, GEN 5,) MISC 1 Device by Does not apply route every other day. 9 each 3   insulin glargine (LANTUS SOLOSTAR) 100 UNIT/ML Solostar Pen Inject 60 Units into the skin daily. 60 mL 3   insulin lispro (HUMALOG) 100 UNIT/ML injection Max daily 125 units a day per pump 40 mL 11   Insulin Pen Needle 31G X 5 MM MISC Use as directed in the morning, at noon, in the evening, and at bedtime. 400 each 3   isosorbide mononitrate (ISMO) 20 MG tablet Take 1 tablet (20 mg total)  by mouth daily. 90 tablet 3   latanoprost (XALATAN) 0.005 % ophthalmic solution Place 1 drop into both eyes at bedtime.     atorvastatin (LIPITOR) 20 MG tablet Take 1 tablet (20 mg total) by mouth daily. 90 tablet 3   carvedilol (COREG) 6.25 MG tablet Take 1 tablet (6.25 mg total) by mouth 2 (two) times daily with a meal. 180 tablet 3   Continuous Blood Gluc Sensor (DEXCOM G6 SENSOR) MISC 1 Device by Does not apply route as directed. 9 each 3   Continuous Blood Gluc Transmit (DEXCOM G6 TRANSMITTER) MISC USE AS DIRECTED  1 each 3   diclofenac Sodium (VOLTAREN) 1 % GEL APPLY 2 GRAMS TOPICALLY 4 (FOUR) TIMES DAILY. 100 g 1   furosemide (LASIX) 20 MG tablet Take 2 tablets (40 mg total) by mouth every morning AND 1 tablet (20 mg total) every evening. 90 tablet 2   losartan (COZAAR) 25 MG tablet Take 2 tablets (50 mg total) by mouth daily. 90 tablet 3   spironolactone (ALDACTONE) 25 MG tablet Take 1 tablet (25 mg total) by mouth daily. 90 tablet 3   Varenicline Tartrate, Starter, (CHANTIX STARTING MONTH PAK) 0.5 MG X 11 & 1 MG X 42 TBPK As directed 53 each 0   No facility-administered medications prior to visit.    Allergies  Allergen Reactions   Lamictal [Lamotrigine] Itching   Strawberry Extract Hives   Sulfa Antibiotics Hives and Itching    ROS Review of Systems  Constitutional:  Positive for unexpected weight change. Negative for chills, diaphoresis and fever.  HENT: Negative.  Negative for congestion, ear pain, hearing loss, nosebleeds, postnasal drip, rhinorrhea, sinus pressure, sore throat, tinnitus, trouble swallowing and voice change.   Eyes: Negative.  Negative for photophobia and redness.  Respiratory: Negative.  Negative for apnea, cough, choking, chest tightness, shortness of breath, wheezing and stridor.   Cardiovascular: Negative.  Negative for chest pain, palpitations and leg swelling.  Gastrointestinal: Negative.  Negative for abdominal distention, abdominal pain, blood in  stool, constipation, diarrhea, nausea and vomiting.  Endocrine: Negative for polydipsia.  Genitourinary:  Positive for dysuria, urgency and vaginal discharge. Negative for flank pain, frequency and hematuria.  Musculoskeletal: Negative.  Negative for arthralgias, back pain, myalgias and neck pain.  Skin: Negative.  Negative for rash.  Allergic/Immunologic: Negative.  Negative for environmental allergies and food allergies.  Neurological: Negative.  Negative for dizziness, tremors, seizures, syncope, weakness and headaches.  Hematological: Negative.  Negative for adenopathy. Does not bruise/bleed easily.  Psychiatric/Behavioral: Negative.  Negative for agitation, sleep disturbance and suicidal ideas. The patient is not nervous/anxious.       Objective:    Physical Exam Vitals reviewed.  Constitutional:      Appearance: Normal appearance. She is well-developed. She is obese. She is not diaphoretic.  HENT:     Head: Normocephalic and atraumatic.     Nose: No nasal deformity, septal deviation, mucosal edema or rhinorrhea.     Right Sinus: No maxillary sinus tenderness or frontal sinus tenderness.     Left Sinus: No maxillary sinus tenderness or frontal sinus tenderness.     Mouth/Throat:     Mouth: Mucous membranes are moist.     Pharynx: No oropharyngeal exudate.     Comments: There is a slight ulceration inside the right cheek mucosa and there is a fractured molar which likely has caused the injury Eyes:     General: No scleral icterus.    Conjunctiva/sclera: Conjunctivae normal.     Pupils: Pupils are equal, round, and reactive to light.  Neck:     Thyroid: No thyromegaly.     Vascular: No carotid bruit or JVD.     Trachea: Trachea normal. No tracheal tenderness or tracheal deviation.  Cardiovascular:     Rate and Rhythm: Normal rate and regular rhythm.     Chest Wall: PMI is not displaced.     Pulses: Normal pulses. No decreased pulses.     Heart sounds: Normal heart sounds, S1  normal and S2 normal. Heart sounds not distant. No murmur heard.    No systolic murmur  is present.     No diastolic murmur is present.     No friction rub. No gallop. No S3 or S4 sounds.  Pulmonary:     Effort: No tachypnea, accessory muscle usage or respiratory distress.     Breath sounds: No stridor. No decreased breath sounds, wheezing, rhonchi or rales.  Chest:     Chest wall: No tenderness.  Abdominal:     General: Bowel sounds are normal. There is no distension.     Palpations: Abdomen is soft. Abdomen is not rigid.     Tenderness: There is no abdominal tenderness. There is no guarding or rebound.  Musculoskeletal:        General: Normal range of motion.     Cervical back: Normal range of motion and neck supple. No edema, erythema or rigidity. No muscular tenderness. Normal range of motion.     Right lower leg: No tenderness. No edema.     Comments: Orthotic on left below-knee amputation left leg functioning well   Lymphadenopathy:     Head:     Right side of head: No submental or submandibular adenopathy.     Left side of head: No submental or submandibular adenopathy.     Cervical: No cervical adenopathy.  Skin:    General: Skin is warm and dry.     Coloration: Skin is not pale.     Findings: No rash.     Nails: There is no clubbing.  Neurological:     General: No focal deficit present.     Mental Status: She is alert and oriented to person, place, and time.     Sensory: No sensory deficit.  Psychiatric:        Mood and Affect: Mood normal.        Speech: Speech normal.        Behavior: Behavior normal.        Thought Content: Thought content normal.        Judgment: Judgment normal.     BP (!) 166/103   Pulse 80   Wt 216 lb 9.6 oz (98.2 kg)   SpO2 100%   BMI 37.18 kg/m  Wt Readings from Last 3 Encounters:  01/13/23 216 lb 9.6 oz (98.2 kg)  01/07/23 213 lb 6.4 oz (96.8 kg)  12/21/22 212 lb (96.2 kg)     Health Maintenance Due  Topic Date Due   Fecal  DNA (Cologuard)  Never done   Diabetic kidney evaluation - Urine ACR  02/04/2023     There are no preventive care reminders to display for this patient.  Lab Results  Component Value Date   TSH 4.396 02/11/2020   Lab Results  Component Value Date   WBC 8.0 11/23/2022   HGB 10.9 (L) 11/23/2022   HCT 33.9 (L) 11/23/2022   MCV 91.1 11/23/2022   PLT 198 11/23/2022   Lab Results  Component Value Date   NA 134 (L) 12/21/2022   K 4.3 12/21/2022   CO2 19 (L) 12/21/2022   GLUCOSE 198 (H) 12/21/2022   BUN 32 (H) 12/21/2022   CREATININE 1.29 (H) 12/21/2022   BILITOT <0.2 10/07/2021   ALKPHOS 87 10/07/2021   AST 17 10/07/2021   ALT 6 10/07/2021   PROT 7.2 10/07/2021   ALBUMIN 3.8 10/07/2021   CALCIUM 8.6 (L) 12/21/2022   ANIONGAP 10 12/21/2022   EGFR 51 (L) 08/10/2022   Lab Results  Component Value Date   CHOL 123 04/21/2022   Lab Results  Component Value Date   HDL 42 04/21/2022   Lab Results  Component Value Date   LDLCALC 53 04/21/2022   Lab Results  Component Value Date   TRIG 141 04/21/2022   Lab Results  Component Value Date   CHOLHDL 2.9 04/21/2022   Lab Results  Component Value Date   HGBA1C 8.8 (A) 12/15/2022      Assessment & Plan:   Problem List Items Addressed This Visit       Cardiovascular and Mediastinum   HYPERTENSION, BENIGN SYSTEMIC (Chronic)    As per heart failure note      Relevant Medications   atorvastatin (LIPITOR) 20 MG tablet   carvedilol (COREG) 12.5 MG tablet   furosemide (LASIX) 20 MG tablet   losartan (COZAAR) 100 MG tablet   spironolactone (ALDACTONE) 25 MG tablet   Chronic combined systolic and diastolic CHF (congestive heart failure) (HCC)    Patient showing weight gain and mild fluid retention we will plan to increase furosemide to 40 mg twice daily and check metabolic panel  All her medications are unchanged with the exception of increasing carvedilol to 12-1/2 mg twice daily and to increase losartan to 100 mg  daily      Relevant Medications   atorvastatin (LIPITOR) 20 MG tablet   carvedilol (COREG) 12.5 MG tablet   furosemide (LASIX) 20 MG tablet   losartan (COZAAR) 100 MG tablet   spironolactone (ALDACTONE) 25 MG tablet   Other Relevant Orders   Comprehensive metabolic panel   CBC with Differential/Platelet     Endocrine   DM type 2 with diabetic peripheral neuropathy (HCC) - Primary   Relevant Medications   atorvastatin (LIPITOR) 20 MG tablet   losartan (COZAAR) 100 MG tablet   varenicline (CHANTIX CONTINUING MONTH PAK) 1 MG tablet   Other Relevant Orders   Microalbumin / creatinine urine ratio   Type 2 diabetes mellitus with hyperglycemia, with long-term current use of insulin (HCC)    Type 2 diabetes with multiple complications including peripheral neuropathy proteinuria  Treatment per endocrinology no changes made she is not a GLP-1 candidate due to pancreatitis      Relevant Medications   atorvastatin (LIPITOR) 20 MG tablet   losartan (COZAAR) 100 MG tablet     Other   Hypercholesteremia    Continue current therapy      Relevant Medications   atorvastatin (LIPITOR) 20 MG tablet   carvedilol (COREG) 12.5 MG tablet   furosemide (LASIX) 20 MG tablet   losartan (COZAAR) 100 MG tablet   spironolactone (ALDACTONE) 25 MG tablet   Other Relevant Orders   Lipid panel   Vitamin D deficiency    Continue supplement      Microalbuminuria    Reassess urine      Dysuria    Plan to assess urine      Relevant Orders   Urinalysis   Urine Culture   RESOLVED: Homelessness    Resolved now has housing      Other Visit Diagnoses     Vaginal discharge       Relevant Orders   Cervicovaginal ancillary only   Iron deficiency anemia, unspecified iron deficiency anemia type       Relevant Orders   Iron, TIBC and Ferritin Panel     Patient needs to process Cologuard kit patient needs blood pressure increase control return visit with clinical pharmacy Follow-up: Return in  about 2 months (around 03/14/2023) for diabetes, htn, heart failure.  38 minutes spent  on this exam complex decision making is high multiple systems assessed  Asencion Noble, MD

## 2023-01-13 NOTE — Assessment & Plan Note (Signed)
Resolved now has housing

## 2023-01-13 NOTE — Telephone Encounter (Signed)
[place] friend. I sent over Dexcom G6 for this patient to [facility]. She thinks it will need a PA. She see's Endo is on multiple insulin injections, Omnipod at times. Can we submit a PA for her?

## 2023-01-13 NOTE — Assessment & Plan Note (Signed)
Type 2 diabetes with multiple complications including peripheral neuropathy proteinuria  Treatment per endocrinology no changes made she is not a GLP-1 candidate due to pancreatitis

## 2023-01-13 NOTE — Assessment & Plan Note (Signed)
As per heart failure note

## 2023-01-13 NOTE — Assessment & Plan Note (Signed)
Plan to assess urine

## 2023-01-13 NOTE — Assessment & Plan Note (Signed)
Reassess urine

## 2023-01-13 NOTE — Assessment & Plan Note (Signed)
Continue current therapy 

## 2023-01-13 NOTE — Assessment & Plan Note (Signed)
Patient showing weight gain and mild fluid retention we will plan to increase furosemide to 40 mg twice daily and check metabolic panel  All her medications are unchanged with the exception of increasing carvedilol to 12-1/2 mg twice daily and to increase losartan to 100 mg daily

## 2023-01-13 NOTE — Patient Instructions (Addendum)
All medications sent to [facility] Increase carvedilol to 12.5 mg twice daily you can take 2 of the 6.25's twice daily but the refill will be a single 12.5 mg twice daily  Increase losartan to 100 mg a day you can take 4 of the 25 mg daily but the refill will be a single 100 mg daily Aldactone stays the same Increase furosemide to 40 mg twice daily All other blood pressure or heart medicines will stay the same No change in your diabetic program Were checking into getting you a Dexcom meter if not we will get you an Accu-Chek meter Chantix refill will be given for smoking cessation Labs today include cholesterol metabolic panel blood count For constipation drink plenty of water and use Metamucil 3 to 4 capsules daily  Urine for microalbumin is obtained today as well and urine sample and vaginal swab  Return to Dr. Joya Gaskins 2 months

## 2023-01-14 LAB — CERVICOVAGINAL ANCILLARY ONLY
Bacterial Vaginitis (gardnerella): POSITIVE — AB
Candida Glabrata: NEGATIVE
Candida Vaginitis: NEGATIVE
Chlamydia: NEGATIVE
Comment: NEGATIVE
Comment: NEGATIVE
Comment: NEGATIVE
Comment: NEGATIVE
Comment: NEGATIVE
Comment: NORMAL
Neisseria Gonorrhea: NEGATIVE
Trichomonas: NEGATIVE

## 2023-01-14 NOTE — Progress Notes (Signed)
Let pt know Blood sugar was very high, kidney stable, urine showed protein and sugar but no infection, iron is normal can reduce iron to once daily, cholesterol is at goal

## 2023-01-15 ENCOUNTER — Telehealth: Payer: Self-pay | Admitting: Critical Care Medicine

## 2023-01-15 LAB — URINALYSIS
Bilirubin, UA: NEGATIVE
Ketones, UA: NEGATIVE
Leukocytes,UA: NEGATIVE
Nitrite, UA: NEGATIVE
Specific Gravity, UA: 1.018 (ref 1.005–1.030)
Urobilinogen, Ur: 0.2 mg/dL (ref 0.2–1.0)
pH, UA: 6 (ref 5.0–7.5)

## 2023-01-15 LAB — COMPREHENSIVE METABOLIC PANEL
ALT: 11 IU/L (ref 0–32)
AST: 14 IU/L (ref 0–40)
Albumin/Globulin Ratio: 1.3 (ref 1.2–2.2)
Albumin: 3.8 g/dL — ABNORMAL LOW (ref 3.9–4.9)
Alkaline Phosphatase: 95 IU/L (ref 44–121)
BUN/Creatinine Ratio: 18 (ref 9–23)
BUN: 20 mg/dL (ref 6–24)
Bilirubin Total: <0.2 mg/dL (ref 0.0–1.2)
CO2: 19 mmol/L — ABNORMAL LOW (ref 20–29)
Calcium: 8.5 mg/dL — ABNORMAL LOW (ref 8.7–10.2)
Chloride: 104 mmol/L (ref 96–106)
Creatinine, Ser: 1.1 mg/dL — ABNORMAL HIGH (ref 0.57–1.00)
Globulin, Total: 3 g/dL (ref 1.5–4.5)
Glucose: 300 mg/dL — ABNORMAL HIGH (ref 70–99)
Potassium: 4.2 mmol/L (ref 3.5–5.2)
Sodium: 136 mmol/L (ref 134–144)
Total Protein: 6.8 g/dL (ref 6.0–8.5)
eGFR: 62 mL/min/{1.73_m2} (ref >59)

## 2023-01-15 LAB — MICROALBUMIN / CREATININE URINE RATIO
Creatinine, Urine: 54.1 mg/dL
Microalb/Creat Ratio: 1366 mg/g creat — ABNORMAL HIGH (ref 0–29)
Microalbumin, Urine: 739.1 ug/mL

## 2023-01-15 LAB — LIPID PANEL
Chol/HDL Ratio: 3 ratio (ref 0.0–4.4)
Cholesterol, Total: 158 mg/dL (ref 100–199)
HDL: 52 mg/dL (ref >39)
LDL Chol Calc (NIH): 88 mg/dL (ref 0–99)
Triglycerides: 95 mg/dL (ref 0–149)
VLDL Cholesterol Cal: 18 mg/dL (ref 5–40)

## 2023-01-15 LAB — IRON,TIBC AND FERRITIN PANEL
Ferritin: 27 ng/mL (ref 15–150)
Iron Saturation: 11 % — ABNORMAL LOW (ref 15–55)
Iron: 36 ug/dL (ref 27–159)
Total Iron Binding Capacity: 320 ug/dL (ref 250–450)
UIBC: 284 ug/dL (ref 131–425)

## 2023-01-15 LAB — URINE CULTURE

## 2023-01-15 MED ORDER — METRONIDAZOLE 500 MG PO TABS: 500.0000 mg | ORAL_TABLET | Freq: Two times a day (BID) | ORAL | 0 refills | Status: DC

## 2023-01-15 NOTE — Telephone Encounter (Signed)
Called pt with results has BV will send Rx for same

## 2023-01-17 ENCOUNTER — Telehealth: Payer: Self-pay | Admitting: Emergency Medicine

## 2023-01-17 NOTE — Telephone Encounter (Signed)
Copied from [place]. Topic: General - Inquiry >> Jan 17, 2023  3:00 PM Leilani Able wrote: [phone] Reason for CRM: Lurena Joiner, Pt is still having issues with her meds and cannot get them straighten out due to ins. Pt states if you can give her a fu call as said you were going to help her.

## 2023-01-18 ENCOUNTER — Other Ambulatory Visit: Payer: Self-pay

## 2023-01-18 ENCOUNTER — Other Ambulatory Visit: Payer: Self-pay | Admitting: Pharmacist

## 2023-01-18 DIAGNOSIS — I5042 Chronic combined systolic (congestive) and diastolic (congestive) heart failure: Secondary | ICD-10-CM

## 2023-01-18 MED ORDER — LOSARTAN POTASSIUM 100 MG PO TABS
100.0000 mg | ORAL_TABLET | Freq: Every day | ORAL | 2 refills | Status: DC
  Filled 2023-01-18: qty 90, 90d supply, fill #0

## 2023-01-18 MED ORDER — FUROSEMIDE 20 MG PO TABS
ORAL_TABLET | ORAL | 2 refills | Status: DC
  Filled 2023-01-18: qty 90, 23d supply, fill #0

## 2023-01-18 MED ORDER — METRONIDAZOLE 500 MG PO TABS
500.0000 mg | ORAL_TABLET | Freq: Two times a day (BID) | ORAL | 0 refills | Status: AC
  Filled 2023-01-18: qty 14, 7d supply, fill #0

## 2023-01-18 MED ORDER — DEXCOM G6 SENSOR MISC
3 refills | Status: DC
  Filled 2023-01-18: qty 3, 30d supply, fill #0

## 2023-01-18 MED ORDER — CARVEDILOL 12.5 MG PO TABS
12.5000 mg | ORAL_TABLET | Freq: Two times a day (BID) | ORAL | 1 refills | Status: DC
  Filled 2023-01-18: qty 120, 60d supply, fill #0

## 2023-01-18 MED ORDER — SPIRONOLACTONE 25 MG PO TABS
25.0000 mg | ORAL_TABLET | Freq: Every day | ORAL | 3 refills | Status: DC
  Filled 2023-01-18: qty 90, 90d supply, fill #0

## 2023-01-18 NOTE — Telephone Encounter (Signed)
Rxs sent to our pharmacy.

## 2023-01-20 ENCOUNTER — Telehealth: Payer: Self-pay

## 2023-01-20 NOTE — Telephone Encounter (Signed)
Pt was called and vm was left, Information has been sent to nurse pool.   

## 2023-01-20 NOTE — Telephone Encounter (Signed)
-----   Message from [name], MD sent at 01/14/2023  2:36 PM EST ----- Let pt know Blood sugar was very high, kidney stable, urine showed protein and sugar but no infection, iron is normal can reduce iron to once daily, cholesterol is at goal

## 2023-01-26 ENCOUNTER — Telehealth (HOSPITAL_COMMUNITY): Payer: Self-pay | Admitting: Licensed Clinical Social Worker

## 2023-01-26 NOTE — Telephone Encounter (Signed)
H&V Care Navigation CSW Progress Note  Clinical Social Worker contacted by patient to inquire about multiple concerns.  Main concern is that she was denied for disability and she is wondering about getting a lawyer- asking for recommendations.  CSW informed pt we are unable to give referrals in this matter so she would have to locate on her own or go to legal aid for more assistance/guidance.    Pt then stated that her food stamps have dropped from about $400 to $50 a month and she doesn't know why.  Said that it might be because she now has rental assistance so her cost of living expenses have reduced since she was paying for a hotel- she will plan to contact her case worker to see if she can get more- very difficult to pay for the food for family of 5 without that extra assistance. CSW sent list of food pantries- pt pretty familiar with local options but appreciates a comprehensive list.  Patient is participating in a Managed Medicaid Plan:  Yes  [place]: Food Insecurity Present (11/01/2022)  Housing: High Risk (09/07/2022)  Transportation Needs: Unmet Transportation Needs (04/01/2022)  Depression (PHQ2-9): High Risk (01/13/2023)  Financial Resource Strain: High Risk (01/10/2023)  Tobacco Use: High Risk (01/13/2023)   Jorge Ny, [facility] Desk#: [phone] Cell#: [phone]

## 2023-01-28 ENCOUNTER — Other Ambulatory Visit (HOSPITAL_COMMUNITY): Payer: Self-pay | Admitting: Emergency Medicine

## 2023-01-28 ENCOUNTER — Telehealth (HOSPITAL_COMMUNITY): Payer: Self-pay | Admitting: Emergency Medicine

## 2023-01-28 NOTE — Telephone Encounter (Signed)
Sarah Long returned my call.  We discussed her current status and she has been in touch with [place] regarding her SSI.  She has been having issues with her DexCom7 and is working with the Endocrinologist office to handle that issue. Discussed discharge from the Paramedicine Program and Sarah Long says she is good.  She has demonstrated ability to take her meds, do her own pill box and manage resources to get what she needs.  I advised her that she is welcome to call me in the future should she need further assistance and she advised she would do same.    [name], Rafael Gonzalez 01/28/2023

## 2023-01-28 NOTE — Progress Notes (Signed)
Patient is now discharged from [facility].  Patient has/has not met the following goals:  Yes :Patient expresses basic understanding of medications and what they are for Yes :Patient able to verbalize heart failure specific dietary/fluid restrictions Yes :Patient is aware of who to call if they have medical concerns or if they need to schedule or change appts Yes :Patient has a scale for daily weights and weighs regularly Yes :Patient able to verbalize concerning symptoms when they should call the HF clinic (weight gain ranges, etc) Yes :Patient has a PCP and has seen within the past year or has upcoming appt Yes :Patient has reliable access to getting their medications Yes :Patient has shown they are able to reorder medications reliably No :Patient has had admission in past 30 days- if yes how many? No :Patient has had admission in past 90 days- if yes how many?  Discharge Comments:  Discussed discharge with Sarah Long via phone call this morning.  She demonstrates a strong grasp of all the things she needs to do pertaining to her heart failure.  She has worked very hard to get herself and her children into a house and has a much more stable living environment.  I advised her to reach out to me should she have needs or questions in the future.    [name], [place] 01/28/2023

## 2023-01-28 NOTE — Telephone Encounter (Signed)
Called to follow-up and discuss discharge from paramedicine program.  Unable to leave voicemail.  Will call later today.  Goal is to discharge today.    [name], [place] 01/28/2023

## 2023-01-31 ENCOUNTER — Telehealth (HOSPITAL_COMMUNITY): Payer: Self-pay | Admitting: Pharmacy Technician

## 2023-01-31 ENCOUNTER — Inpatient Hospital Stay (HOSPITAL_COMMUNITY)
Admission: RE | Admit: 2023-01-31 | Discharge: 2023-01-31 | Disposition: A | Discharge status: Home / Self Care | Payer: Medicaid Other | Source: Ambulatory Visit | Attending: Cardiology | Admitting: Cardiology

## 2023-01-31 ENCOUNTER — Ambulatory Visit (HOSPITAL_BASED_OUTPATIENT_CLINIC_OR_DEPARTMENT_OTHER)
Admission: RE | Admit: 2023-01-31 | Discharge: 2023-01-31 | Disposition: A | Discharge status: Home / Self Care | Payer: Medicaid Other | Source: Ambulatory Visit | Attending: Cardiology | Admitting: Cardiology

## 2023-01-31 ENCOUNTER — Telehealth (HOSPITAL_COMMUNITY): Payer: Self-pay

## 2023-01-31 ENCOUNTER — Other Ambulatory Visit (HOSPITAL_COMMUNITY): Payer: Self-pay

## 2023-01-31 ENCOUNTER — Other Ambulatory Visit (HOSPITAL_COMMUNITY): Payer: Self-pay | Admitting: Cardiology

## 2023-01-31 ENCOUNTER — Encounter (HOSPITAL_COMMUNITY): Payer: Self-pay | Admitting: Cardiology

## 2023-01-31 ENCOUNTER — Ambulatory Visit (HOSPITAL_COMMUNITY)
Admission: RE | Admit: 2023-01-31 | Discharge: 2023-01-31 | Disposition: A | Discharge status: Home / Self Care | Payer: Medicaid Other | Source: Ambulatory Visit | Attending: Cardiology | Admitting: Cardiology

## 2023-01-31 VITALS — BP 114/78 | HR 78 | Wt 210.6 lb

## 2023-01-31 DIAGNOSIS — I130 Hypertensive heart and chronic kidney disease with heart failure and stage 1 through stage 4 chronic kidney disease, or unspecified chronic kidney disease: Secondary | ICD-10-CM | POA: Diagnosis not present

## 2023-01-31 DIAGNOSIS — E785 Hyperlipidemia, unspecified: Secondary | ICD-10-CM | POA: Diagnosis not present

## 2023-01-31 DIAGNOSIS — Z794 Long term (current) use of insulin: Secondary | ICD-10-CM | POA: Diagnosis not present

## 2023-01-31 DIAGNOSIS — R002 Palpitations: Secondary | ICD-10-CM

## 2023-01-31 DIAGNOSIS — I442 Atrioventricular block, complete: Secondary | ICD-10-CM | POA: Insufficient documentation

## 2023-01-31 DIAGNOSIS — E669 Obesity, unspecified: Secondary | ICD-10-CM | POA: Insufficient documentation

## 2023-01-31 DIAGNOSIS — Z79899 Other long term (current) drug therapy: Secondary | ICD-10-CM | POA: Diagnosis not present

## 2023-01-31 DIAGNOSIS — I5042 Chronic combined systolic (congestive) and diastolic (congestive) heart failure: Secondary | ICD-10-CM

## 2023-01-31 DIAGNOSIS — F17210 Nicotine dependence, cigarettes, uncomplicated: Secondary | ICD-10-CM | POA: Insufficient documentation

## 2023-01-31 DIAGNOSIS — N183 Chronic kidney disease, stage 3 unspecified: Secondary | ICD-10-CM | POA: Insufficient documentation

## 2023-01-31 DIAGNOSIS — Z8249 Family history of ischemic heart disease and other diseases of the circulatory system: Secondary | ICD-10-CM | POA: Diagnosis not present

## 2023-01-31 DIAGNOSIS — E1122 Type 2 diabetes mellitus with diabetic chronic kidney disease: Secondary | ICD-10-CM | POA: Insufficient documentation

## 2023-01-31 DIAGNOSIS — Z7984 Long term (current) use of oral hypoglycemic drugs: Secondary | ICD-10-CM | POA: Diagnosis not present

## 2023-01-31 DIAGNOSIS — Z6836 Body mass index (BMI) 36.0-36.9, adult: Secondary | ICD-10-CM | POA: Diagnosis not present

## 2023-01-31 DIAGNOSIS — I428 Other cardiomyopathies: Secondary | ICD-10-CM | POA: Diagnosis not present

## 2023-01-31 DIAGNOSIS — I5022 Chronic systolic (congestive) heart failure: Secondary | ICD-10-CM | POA: Diagnosis not present

## 2023-01-31 LAB — ECHOCARDIOGRAM COMPLETE
AR max vel: 2.63 cm2
AV Area VTI: 2.5 cm2
AV Area mean vel: 2.54 cm2
AV Mean grad: 4 mmHg
AV Peak grad: 8.3 mmHg
Ao pk vel: 1.44 m/s
Area-P 1/2: 3.42 cm2
S' Lateral: 2.2 cm

## 2023-01-31 MED ORDER — DAPAGLIFLOZIN PROPANEDIOL 10 MG PO TABS: 10.0000 mg | ORAL_TABLET | Freq: Every day | ORAL | 11 refills | Status: DC

## 2023-01-31 MED ORDER — FUROSEMIDE 20 MG PO TABS: 40.0000 mg | ORAL_TABLET | Freq: Every day | ORAL | 3 refills | Status: DC

## 2023-01-31 NOTE — Progress Notes (Signed)
PCP: [name], MD Endocrinology: Dr. Kelton Pillar EP: Dr. Curt Bears Cardiology: Dr. Aundra Dubin  52 y.o. with history of type 2 diabetes, HTN, hyperlipidemia, complete heart block with PPM, and chronic diastolic CHF was referred by Dr. Joya Gaskins for evaluation of CHF.  Patient has had diabetes since age 31 and has had HTN for at least 5 years.  She is a smoker and has a history of prior ETOH abuse with ETOH pancreatitis. In 3/21, she was admitted with DKA.  She was found to have associated complete heart block that did not resolve, she had St Jude PPM placed.  She had an echocardiogram showing EF 40-45% with severe LV hypertrophy.  She did not have a cardiac MRI prior to PPM placement.  Cath in 3/21 showed no significant CAD (nonischemic cardiomyopathy).   Last seen in 5/21, doing well but not taking any cardiac meds. Entresto started and started work up for cardiac sarcoidosis and cardiac amyloidosis.  Echo (12/22): EF 70-75%, severe LVH, normal RV, mild to moderate MR.  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 She has a functioning orthotic.   She had follow up with EP 1/23, PPM functioning appropriately. Daily lasix restarted and cMRI arranged.  cMRI (2/23) with LVEF 68%, severe LVH, ECV 54%, no LVOT gradient, poor nulling with uninterpretable delayed enhancement.   Follow up 5/23, NYHA II symptoms, volume overloaded. Lasix increase to 40 daily and genetic testing arranged to evaluate for TTR amyloidosis and hypertrophic cardiomyopathies. 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  Of note, "heart problems" run in family with mother and maternal grandparents having CHF.  Alpha galactosidase activity was low at 20.1.  She was seen for genetics counseling by Dr. 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   Follow up 12/23, NYHA II-III symptoms and volume stable. Referred to Fabry's provider at [facility] to begin treatment with Fabrazyme. Labs  showed elevated SCr of 1.6 and K of 6.5. She was instructed to hold spiro, losartan, stop KCL and go to the ED. She was given a dose of Lokelma and repeat labs arranged.  Echo was done today and reviewed, EF 65-70%, severe LVH, normal RV size and systolic function, GLS -[id number] (very abnormal), IVC normal.   Today she returns for HF follow up. She still has not gotten an appointment in the Fabry's clinic.  She is mildly short of breath walking up a flight of stairs or walking > 1 block. Occasional atypical chest pain.  Frequent palpitations, no lightheadedness. Still smoking 2-3 cigarettes/day, has run out of Chantix.   ECG (personally reviewed): NSR with V-pacing  Labs (4/21): K 4, creatinine 0.87 Labs (2/23): K 4.4, creatinine 1.12 Labs (5/23): K 5.2, creatinine 1.14, LDL 53, HDL 42 Labs (8/23): K 4.4, creatinine 1.48 Labs (10/23): K 5.1, creatinine 1.34 Labs (12/23): K 5.1, creatinine 1.06 Labs (2/24): LDL 88, K 4.2, creatinine 1.10  PMH: 1. Type 2 diabetes: Diagnosed at age 67.  2. HTN 3. Hyperlipidemia 4. Active smoker 5. H/o ETOH pancreatitis 6. Bipolar disorder 7. Complete heart block: 3/21, associate with DKA episode.  Has St Jude PPM.  8. Chronic systolic CHF: Nonischemic cardiomyopathy.  Echo (3/21) with EF 40-45%, severe LVH, mild MR, normal RV.  - LHC (3/21) normal coronaries.  - PYP scan (6/21): Negative for TTR cardiac amyloidosis.  - cMRI (2/23): LVEF 68%, severe LVH, ECV 54%, no LVOT gradient, poor nulling with uninterpretable delayed enhancement.  - CTA  chest (7/23): No evidence for pulmonary sarcoidosis, no ILD.  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  - Alpha-galactosidase activity testing low (20.1) - Echo (2/24): EF 65-70%, severe LVH, normal RV size and systolic function, GLS -[id number] (very abnormal), IVC normal.  9. S/p left BKA (7/22). Has Phantom Limb pain. 10. CKD stage 3: Diabetic nephropathy, ?related to Fabry  disease.   SH: Divorced, un-employed, prior heavy ETOH now rare, smokes 5 cigarettes daily.  FH: Grandfather with CHF. No known sarcoidosis or amyloidosis.   ROS: All systems reviewed and negative except as per HPI.   Current Outpatient Medications  Medication Sig Dispense Refill   acetaminophen (TYLENOL) 325 MG tablet Take 650 mg by mouth every 6 (six) hours as needed (pain).     atorvastatin (LIPITOR) 20 MG tablet Take 1 tablet (20 mg total) by mouth daily. 90 tablet 3   Blood Pressure Monitoring (BLOOD PRESSURE MONITOR 7) DEVI Measure blood pressure daily 1 each 0   carvedilol (COREG) 12.5 MG tablet Take 1 tablet (12.5 mg total) by mouth 2 (two) times daily with a meal. 120 tablet 1   Continuous Blood Gluc Sensor (DEXCOM G6 SENSOR) MISC Use to check blood sugar three times daily. Change sensors once every 10 days. Dx code: E11.42 3 each 3   Continuous Blood Gluc Transmit (DEXCOM G6 TRANSMITTER) MISC Use to check blood sugar three times daily. Change once every 90 days. Dx code: E11.42 1 each 3   dapagliflozin propanediol (FARXIGA) 10 MG TABS tablet Take 1 tablet (10 mg total) by mouth daily before breakfast. 30 tablet 11   hydrALAZINE (APRESOLINE) 25 MG tablet Take 1 tablet (25 mg total) by mouth 3 (three) times daily. (Patient taking differently: Take 25 mg by mouth 2 (two) times daily.) 270 tablet 3   Insulin Disposable Pump (OMNIPOD 5 G6 POD, GEN 5,) MISC 1 Device by Does not apply route every other day. 9 each 3   insulin glargine (LANTUS SOLOSTAR) 100 UNIT/ML Solostar Pen Inject 60 Units into the skin daily. (Patient taking differently: Inject 60 Units into the skin daily. Patient takes when not using her insulin pump) 60 mL 3   insulin lispro (HUMALOG) 100 UNIT/ML injection Max daily 125 units a day per pump 40 mL 11   Insulin Pen Needle 31G X 5 MM MISC Use as directed in the morning, at noon, in the evening, and at bedtime. 400 each 3   isosorbide mononitrate (ISMO) 20 MG tablet Take  1 tablet (20 mg total) by mouth daily. 90 tablet 3   latanoprost (XALATAN) 0.005 % ophthalmic solution Place 1 drop into both eyes at bedtime.     losartan (COZAAR) 100 MG tablet Take 1 tablet (100 mg total) by mouth daily. 90 tablet 2   spironolactone (ALDACTONE) 25 MG tablet Take 1 tablet (25 mg total) by mouth daily. 90 tablet 3   varenicline (CHANTIX CONTINUING MONTH PAK) 1 MG tablet Take 1 tablet (1 mg total) by mouth 2 (two) times daily. 60 tablet 1   furosemide (LASIX) 20 MG tablet Take 2 tablets (40 mg total) by mouth daily. Patient takes 2 tablets by mouth in the morning and 1 tablet at night. 180 tablet 3   No current facility-administered medications for this encounter.   Wt Readings from Last 3 Encounters:  01/31/23 95.5 kg (210 lb 9.6 oz)  01/13/23 98.2 kg (216 lb 9.6 oz)  01/07/23 96.8 kg (213 lb 6.4 oz)  BP 114/78   Pulse 78   Wt 95.5 kg (210 lb 9.6 oz)   SpO2 98%   BMI 36.15 kg/m  General: NAD Neck: No JVD, no thyromegaly or thyroid nodule.  Lungs: Clear to auscultation bilaterally with normal respiratory effort. CV: Nondisplaced PMI.  Heart regular S1/S2, no S3/S4, no murmur.  No peripheral edema.  No carotid bruit.  Normal pedal pulses.  Abdomen: Soft, nontender, no hepatosplenomegaly, no distention.  Skin: Intact without lesions or rashes.  Neurologic: Alert and oriented x 3.  Psych: Normal affect. Extremities: No clubbing or cyanosis. Left BKA.  HEENT: Normal.   Assessment/Plan: 1. Chronic systolic CHF: Echo in [id number] with EF 40-45%, severe LV hypertrophy.  This was found in association with complete heart block.  Nonischemic cardiomyopathy, cath in 3/21 showed no significant CAD.  PYP scan (6/21) not suggestive of TTR amyloidosis. cMRI (2/23) showed LVEF 68%, severe LVH, ECV 54%, no LVOT gradient, poor nulling with uninterpretable delayed enhancement. CTA chest did not show evidence for pulmonary sarcoidosis.  Invitae gene testing showed her to be a heterozygote  for a GLA mutation linked to Fabry's disease, alpha galactosidase level was low.  There is wide phenotypic variation in female Fabry's heterozygotes, but I suspect that the cardiomyopathy with LVH and h/o complete heart block in this situation is most likely due to Fabry's disease. Echo today showed EF 65-70%, severe LVH, normal RV size and systolic function, GLS -[id number] (very abnormal), IVC normal.  NYHA class II-III symptoms, not volume overloaded on exam.   - We will continue to work to get her an appt at Dr. Lelan Pons McDonald's office at [facility] for Fabry's evaluation. She will need treatment with Fabrazyme (enzyme replacement).  - Continue hydralazine 25 mg tid + Imdur 20 mg daily.  - I will start Farxiga 10 mg daily and decrease Lasix to 40 mg daily with BMET/BNP in 10 days.   - Continue spironolactone 25 mg daily.  - Continue Coreg 6.25 mg bid. - Continue losartan 100 mg daily. 2. HTN: Mildly elevated today, but generally well-controlled at home. No changes for now. 3. Hyperlipidemia: Lipids acceptable in 2/24.  4. H/o smoking: Quit but restarted after running out of Chantix.   - Restart Chantix. 5. Type 2 diabetes: Followed by Endocrinology. She is on insulin. 6. Complete heart block: She has a St Jude PPM and is pacing her RV > 99% of the time.  It is possible that this could be related to Fabry's disease.  7. Obesity: Body mass index is 36.15 kg/m. - Would avoid GLP-1 agonist with history of pancreatitis.  8. Palpitations: Will arrange for 7 day Zio monitor for PVC quantification.   Followup 3 months with APP.   Loralie Champagne  01/31/2023

## 2023-01-31 NOTE — Telephone Encounter (Signed)
Called [facility] to follow up with her appointment to see Dr. Karenann Cai. Left message for them to call the office to give an update.

## 2023-01-31 NOTE — Telephone Encounter (Signed)
Patient Advocate Encounter   Received notification from [employer] that prior authorization for Wilder Glade (generic) is required.   PA submitted on CoverMyMeds Key [id number] Status is pending   Will continue to follow.

## 2023-01-31 NOTE — Progress Notes (Signed)
Echocardiogram 2D Echocardiogram has been performed.  Ronny Flurry 01/31/2023, 9:08 AM

## 2023-01-31 NOTE — Patient Instructions (Addendum)
Start Farxiga 10 mg daily  DECREASE Lasix to 20 mg daily.  Blood work in 10 days  Your provider has recommended that  you wear a Zio Patch for 7 days.  This monitor will record your heart rhythm for our review.  IF you have any symptoms while wearing the monitor please press the button.  If you have any issues with the patch or you notice a red or orange light on it please call the company at [phone].  Once you remove the patch please mail it back to the company as soon as possible so we can get the results.   Your physician recommends that you schedule a follow-up appointment in: 3 months  If you have any questions or concerns before your next appointment please send [place] a message through [place] or call our office at [phone].    TO LEAVE A MESSAGE FOR THE NURSE SELECT OPTION 2, PLEASE LEAVE A MESSAGE INCLUDING: YOUR NAME DATE OF BIRTH CALL BACK NUMBER REASON FOR CALL**this is important as we prioritize the call backs  YOU WILL RECEIVE A CALL BACK THE SAME DAY AS LONG AS YOU CALL BEFORE 4:00 PM  At the [facility], you and your health needs are our priority. As part of our continuing mission to provide you with exceptional heart care, we have created designated Provider Care Teams. These Care Teams include your primary Cardiologist (physician) and Advanced Practice Providers (APPs- Physician Assistants and Nurse Practitioners) who all work together to provide you with the care you need, when you need it.   You may see any of the following providers on your designated Care Team at your next follow up: Dr Glori Bickers Dr Loralie Champagne Dr. Roxana Hires, NP Lyda Jester, [place] [facility] [place], [place] Forestine Na, NP Audry Riles, PharmD   Please be sure to bring in all your medications bottles to every appointment.    Thank you for choosing [facility]

## 2023-01-31 NOTE — Progress Notes (Signed)
Zio patch placed onto patient.  All instructions and information reviewed with patient, they verbalize understanding with no questions. 

## 2023-02-01 ENCOUNTER — Encounter (HOSPITAL_COMMUNITY): Payer: Self-pay | Admitting: Pharmacy Technician

## 2023-02-01 ENCOUNTER — Other Ambulatory Visit (HOSPITAL_COMMUNITY): Payer: Self-pay

## 2023-02-01 ENCOUNTER — Telehealth (HOSPITAL_COMMUNITY): Payer: Self-pay | Admitting: Surgery

## 2023-02-01 ENCOUNTER — Telehealth: Payer: Self-pay | Admitting: *Deleted

## 2023-02-01 NOTE — Telephone Encounter (Signed)
Advanced Heart Failure Patient Advocate Encounter  PA for Farxiga canceled. "We would like to inform you that we are unable to further process this request. Our records indicate Sarah Long has alternative pharmacy benefits. Please instruct the pharmacy to bill the patients primary insurance plan first. If the requested medication is not covered or was denied by your primary insurance an explanation of benefits or proof of denial is required prior to coverage [employer] [facility] [facility]."   Called and spoke with the patient. She states that she does not have alternate coverage and that she has the disenrollment letter from the previous coverage. She is not sure what else to do. I will send her a link to directly upload the disenrollment letter to [facility] along with the free trial for Farxiga. I encouraged her to reach out to her worker directly as well.   [name], CPhT

## 2023-02-01 NOTE — Telephone Encounter (Signed)
Pt is seen by [facility], sleep study was most likely set up there.

## 2023-02-01 NOTE — Telephone Encounter (Signed)
Prior Authorization for [facility] sent to [facility] via web portal. Tracking Number . [id number]

## 2023-02-01 NOTE — Telephone Encounter (Signed)
I attempted to reach patient regarding sleep study.  She had previously completed a sleep study in Dec. and I have not received communication that it needed to be repeated. I did receive communication from [facility] office about forwarded insurance precert check to our office to investigate for repeat study.  I requested patient return my call for clarification that she was requested to complete another study and has the device.

## 2023-02-08 ENCOUNTER — Telehealth (HOSPITAL_COMMUNITY): Payer: Self-pay

## 2023-02-08 NOTE — Telephone Encounter (Signed)
Patient called and stated she has not gotten her Wilder Glade yet. She wanted to know if she still needed to come in for her lab appointment. Please advise

## 2023-02-08 NOTE — Telephone Encounter (Signed)
Left message for her to call back for instructions

## 2023-02-09 NOTE — Telephone Encounter (Signed)
Spoke to patient and went over medication information. She verbalized understanding and will let us know when she gets [place].

## 2023-02-11 ENCOUNTER — Other Ambulatory Visit (HOSPITAL_COMMUNITY): Payer: Medicaid Other

## 2023-02-11 ENCOUNTER — Telehealth (HOSPITAL_COMMUNITY): Payer: Self-pay | Admitting: Surgery

## 2023-02-11 NOTE — Telephone Encounter (Signed)
I called patient to advise her that she needed to repeat the home sleep study secondary to insufficient data.  I left a message indicating that I would leave a device at the front desk so that she could pick it up and to return my call with any questions or concerns.

## 2023-03-01 NOTE — Addendum Note (Signed)
Encounter addended by: Micki Riley, RN on: 03/01/2023 11:25 AM  Actions taken: Imaging Exam ended

## 2023-03-07 ENCOUNTER — Telehealth (HOSPITAL_COMMUNITY): Payer: Self-pay

## 2023-03-07 NOTE — Telephone Encounter (Signed)
Patient aware of zio results. She has been unable to get an appointment at the clinic for her 89

## 2023-03-09 ENCOUNTER — Other Ambulatory Visit (HOSPITAL_COMMUNITY): Payer: Self-pay

## 2023-03-09 ENCOUNTER — Telehealth (HOSPITAL_COMMUNITY): Payer: Self-pay | Admitting: Licensed Clinical Social Worker

## 2023-03-09 NOTE — Telephone Encounter (Signed)
H&V Care Navigation CSW Progress Note  Clinical Social Worker had been trying to call pt to inform of sleep study at front desk that is waiting for her.  Pt called CSW back today and confirmed she will come to get it.  She then states she has not been able to get her medications because Medicaid still has a bar on her account cause she was given random commercial insurance.  She had canceled and sent in proof of cancellation to Medicaid but states it is still saying there is a bar- she has not attempted to speak with pharmacy about this for several weeks.  CSW called pharmacy and they report they are able to process prescriptions under pt Medicaid.  They will fill all her medications- pt informed and will plan to pick up today.  Pt reports she is aware of how to take medications.  Patient is participating in a Managed Medicaid Plan:  Yes  [place]: Food Insecurity Present (01/26/2023)  Housing: High Risk (09/07/2022)  Transportation Needs: Unmet Transportation Needs (04/01/2022)  Depression (PHQ2-9): High Risk (01/13/2023)  Financial Resource Strain: High Risk (01/10/2023)  Tobacco Use: High Risk (01/31/2023)   Jorge Ny, LCSW Clinical Social Worker [facility] Desk#: [phone] Cell#: [phone]

## 2023-03-10 ENCOUNTER — Other Ambulatory Visit (HOSPITAL_COMMUNITY): Payer: Self-pay

## 2023-03-11 ENCOUNTER — Telehealth (HOSPITAL_COMMUNITY): Payer: Self-pay

## 2023-03-11 ENCOUNTER — Other Ambulatory Visit (HOSPITAL_COMMUNITY): Payer: Self-pay

## 2023-03-11 NOTE — Telephone Encounter (Signed)
Patient Advocate Encounter  Prior authorization for [place] submitted and APPROVED. Test billing returns $4 copay for 90 day supply.  Key [id number] Effective: 03/09/2023 - 03/08/2024  Burnell Blanks, CPhT Rx Patient Advocate Phone: [phone]

## 2023-03-13 NOTE — Progress Notes (Unsigned)
Established Patient Office Visit  Subjective:  Patient ID: Sarah Long, female    DOB: 09/27/1971  Age: 52 y.o. MRN: [id number]  CC:  No chief complaint on file.   HPI 11/2021 Sarah Long presents for primary care follow-up and diabetes management  Since last seen in October the patient has continued improvement.  She has been followed closely by endocrinology and her A1c is now down to 7.4.  It had been greater than 12.  The patient also now has a Dexcom glucometer and has showing data suggesting she is staying in the 100-150 range of her blood sugars.  The patient has had a left below-knee amputation now has a functional orthotic.  Patient is followed by rehab.  Patient's phantom limb pain is being managed by the rehab physician and she states is markedly better.  She has had 1 toe amputated on the right foot.  Recently developed a sore internally in her mouth.  This patient has poor dentition and needs to be seen by dentist.  Also she is living in a hotel and may be losing her housing soon and needs assistance in this regard.  Blood pressure is good on arrival 132/87  4/17 Patient seen in return follow-up unfortunately not been taking her oral medications because she cannot afford the co-pay.  She is taking her insulin products.  On arrival blood pressure is elevated 145/102.  She does have a Dexcom meter and recently saw endocrinology.  Below is documentation from that visit.  Endo 02/2022 Today (02/03/22): Sarah Long is here for a follow up on diabetes management. She checks her  blood sugars multiple times daily through CGM.  The patient has  had hypoglycemic episodes since the last clinic visit, which typically occur rarely and after a bolus.     She has made lifestyle changes with weight loss  Has noted prosthetics    HOME DIABETES REGIMEN: Lantus 35 units daily - takes 45  Humalog 10 units TID QAC- 10-15 units  CF: Humalog (BG -130/25)      Statin: no ACE-I/ARB: no Prior  Diabetic Education: yes  Glycemic patterns summary: BG's within goal at night, high during the day    Hyperglycemic episodes postprandial    Hypoglycemic episodes occurred post bolus   Overnight periods: trends down       DIABETIC COMPLICATIONS: Microvascular complications:  Left BKA, Right  great toe amputation , neuropathy  Denies: CKD, retinopathy  Last eye exam: Completed 01/2021   Macrovascular complications:  PAD Denies: CAD, CVA   1) Type 2 Diabetes Mellitus, Sub-Optimally controlled, With neuropathic complications, S/P Left BKA and Right great toe amputation  - Most recent A1c of 7.9 %. Goal A1c < 7.0 %.       - A1c remains above goal - She unfortunately has self adjusted her insulin regimen, it appears that she has not been using the correction scale, she does admit to guessing on prandial dose has occasional hypoglycemia -We discussed the importance of following instructions so that we make the appropriate changes were necessary -In review of her CGM, I am going to increase her basal as well as prandial dose, she will be provided with a new correction scale - She has a hx of Pancreatitis , not a candidate for DPP 4 inhibitors and GLP-1 agonist - Has hx of DKA , would avoid SGLT2 inhibitors       MEDICATIONS: Increase Lantus to 50 units daily  Take Humalog 12 units TIDQAC  Correction Factor: Humalog (BG -130/25)    EDUCATION / INSTRUCTIONS: BG monitoring instructions: Patient is instructed to check her blood sugars 3 times a day, before meals . Call [facility] if: BG persistently < 70  I reviewed the Rule of 15 for the treatment of hypoglycemia in detail with the patient. Literature supplied.     2) Diabetic complications:  Eye: Does not have known diabetic retinopathy.  Neuro/ Feet: Does  have known diabetic peripheral neuropathy. Renal: Patient does not have known baseline CKD. She is not on an ACEI/ARB at present.     3) Dyslipidemia :      - She is not on atorvastatin, patient does not like taking medication and would rather use natural route if possible -We discussed the increased risk of cardiovascular disease as well as CVA with hyperlipidemia in the setting of diabetes   4) Microalbuminuria:       -She has losartan listed but she is not taking it.  We discussed the renal benefits with ARB and I have encouraged her to consider taking it   Medication Restart losartan 25 mg daily   Patient never restarted the losartan or atorvastatin because she cannot afford the $4 co-pay.  She is using insulin as prescribed.  She still living in a motel with her children but is having difficulty affording the rent.  9/5 Patient seen in return follow-up and is followed by cardiology closely for her cardiomyopathy nonischemic.  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  She is undergoing genetic testing for this.  She does have a pacemaker in place.  Cardiac medicines have been adjusted by cardiology.  Patient is also followed by endocrinology and she is on 70 units of Lantus and a sliding scale Humalog average dose of 14 units before meals.  She is trying to get set up for the insulin pump but is yet to achieve this.  She was seen by ophthalmology in April of this year and found to not have diabetic eye disease.  She is due a colon cancer screening and agrees to receive the Cologuard kit.  She needs dental care follow-up.  Also needs mental health follow-up.  She cannot take Latuda because of her cardiac disease and had itching with Lamictal.  She is due a flu vaccine and agrees to receive this is also due foot exam at this visit  01/13/23 Patient is seen in return follow-up has not been seen since last September.  Patient's had difficulty with her insurance partially approving some of her diabetic supplies including her insulin pump and Dexcom 6.  She has been followed by endocrinology documentation of the last visit is as below.   She still has elevated A1c.  She is on long and short acting insulin at this time.  She is not a candidate for a GLP-1 because of pancreatitis in the past.  Patient does have constipation as well.  She was off cigarettes with use of Chantix she ran out of Chantix now she is back smoking.  Also has mild excess bleeding and cervical vaginal discharge she may yet need gynecology referral.  She also is having some dysuria.  She has a blood pressure meter at home but does not take the blood pressure.  Blood pressure today on arrival is elevated 166/103. Patient is now out of the hotel living in a home.  Her children are with her.  Below is a recent endocrinology note 1) Type 2 Diabetes Mellitus, Poorly  controlled, With neuropathic complications, S/P Left BKA and Right great toe amputation  - Most recent A1c of 8.8 %. Goal A1c < 7.0 %.       - A1c remains above goal - She has a hx of Pancreatitis , not a candidate for DPP 4 inhibitors and GLP-1 agonist - Has hx of DKA , would avoid SGLT2 inhibitors -She has not been on the pump for the past 3 weeks due to insurance coverage, she is working with her current insurance and will be able to eventually stay on Medicaid and will obtain the OmniPod pump.  Patient liked the pump and would like to stay on it -Despite her elevated A1c, we have been able to download her pump through December, her average BG's at the time 142 mg/DL, within range BG's have improved from 26% to 74%.  And hyperglycemia has improved from 44% down to 20%. -Overall if she is able to continue on the pump this will be a better option, the patient was advised that if she has no Dexcom she should continue to check glucose before each meal and entered into the pump while in the manual mode -I also emphasized the importance of being on basal insulin if she is not going to be on the pump for 24 hours or more -A refill of the Lantus was prescribed to use when she is of the pump, patient was advised  to continue to use Humalog 14 units with each meal -Unable to make any changes to the pump settings as the patient did not bring her PDM     MEDICATIONS:   -Take Lantus 60 units daily while of OmniPod -Continue Humalog 14 units with each meal -Patient will return to the current pump settings once the insurance issues have been resolved   Below is recent cardiology note the patient appears to have a form of amyloidosis and is undergoing consideration for treatment at [facility] 1. Chronic systolic CHF: Echo in 3/21 with EF 40-45%, severe LV hypertrophy.  This was found in association with complete heart block.  Nonischemic cardiomyopathy, cath in 3/21 showed no significant CAD.  PYP scan (6/21) not suggestive of TTR amyloidosis. cMRI (2/23) showed LVEF 68%, severe LVH, ECV 54%, no LVOT gradient, poor nulling with uninterpretable delayed enhancement. CTA chest did not show evidence for pulmonary sarcoidosis.  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  There is wide phenotypic variation in female Fabry's heterozygotes, but I suspect that the cardiomyopathy with LVH and h/o complete heart block in this situation is most likely due to Fabry's disease. She is not volume overloaded on exam with NYHA class II-III symptoms.  - We will continue to work to get her an appt at Dr. Hilda Lias McDonald's office at [facility]. She will need treatment with Fabrazyme (enzyme replacement).  - Continue hydralazine 25 mg tid + Imdur 20 mg daily.  - Continue Lasix 40 qam/20 qpm. BMET today.  - Continue spironolactone 25 mg daily.  - Continue Coreg 6.25 mg bid. - Continue losartan 50 mg daily. - Echo at followup appt.  2. HTN: Mildly elevated today, but generally well-controlled at home. No changes for now. 3. Hyperlipidemia: Good lipids 5/23. 4. H/o smoking: Quit x 1 month.    - Continue Chantix. 5. Type 2 diabetes: Followed by  Endocrinology. She is on insulin. 6. Complete heart block: She has a Secondary school teacher PPM and is pacing  her RV > 99% of the time.  It is possible that this could be related to Fabry's disease.  7. Obesity: Body mass index is 36.39 kg/m. - Considered referral for GLP1RA, but she has history of pancreatitis. 8. CKD stage 3: Suspect diabetic nephropathy.  This may be related to Fabry's disease as well.  - BMET today.   Past Medical History:  Diagnosis Date   Abnormal Pap smear 1998   Abnormal vaginal bleeding 12/19/2011   Acute urinary retention 06/26/2021   Arthritis    Bacterial infection    Bipolar 1 disorder (HCC)    Blister of second toe of left foot 11/21/2016   Candida vaginitis 07/2007   Depression    recently added wellbutrin-has not taken yet for bipolar   Diabetes in pregnancy    Diabetes mellitus    nph 20U qam and qpm, regular with meals   Diabetic ketoacidosis without coma associated with type 2 diabetes mellitus (HCC)    Fibroid    Galactorrhea of right breast 2008   GERD (gastroesophageal reflux disease)    H/O amenorrhea 07/2007   H/O dizziness 10/14/2011   H/O dysmenorrhea 2010   H/O menorrhagia 10/14/2011   H/O varicella    Headache(784.0)    Heavy vaginal bleeding due to contraceptive injection use 10/12/2011   Depo Provera   Herpes    Homelessness 11/19/2021   HSV-2 infection 01/03/2009   Hx: UTI (urinary tract infection) 2009   Hypertension    on aldomet   Hypoalbuminemia due to protein-calorie malnutrition (HCC) 06/26/2021   Increased BMI 2010   Irregular uterine bleeding 04/04/2012   Pt has mirena    Obesity 10/14/2011   Oligomenorrhea 07/2007   Osteomyelitis of great toe of right foot (HCC) 03/22/2022   Pelvic pain in female    Presence of permanent cardiac pacemaker    Preterm labor    Trichomonas    Yeast infection     Past Surgical History:  Procedure Laterality Date   ABDOMINAL AORTOGRAM W/LOWER EXTREMITY N/A 06/17/2021   Procedure: ABDOMINAL  AORTOGRAM W/LOWER EXTREMITY;  Surgeon: Iran Ouch, MD;  Location: [facility] INVASIVE [facility] LAB;  Service: Cardiovascular;  Laterality: N/A;   AMPUTATION Left 06/27/2021   Procedure: AMPUTATION BELOW KNEE;  Surgeon: Nadara Mustard, MD;  Location: [facility] OR;  Service: Orthopedics;  Laterality: Left;   AMPUTATION Right 09/04/2021   Procedure: RIGHT GREAT TOE AMPUTATION;  Surgeon: Nadara Mustard, MD;  Location: [facility] OR;  Service: Orthopedics;  Laterality: Right;   CESAREAN SECTION  1991   LEFT HEART CATH AND CORONARY ANGIOGRAPHY N/A 02/10/2020   Procedure: LEFT HEART CATH AND CORONARY ANGIOGRAPHY;  Surgeon: Lennette Bihari, MD;  Location: [facility] INVASIVE CV LAB;  Service: Cardiovascular;  Laterality: N/A;   PACEMAKER IMPLANT N/A 02/13/2020   Procedure: PACEMAKER IMPLANT;  Surgeon: Regan Lemming, MD;  Location: [facility] INVASIVE CV LAB;  Service: Cardiovascular;  Laterality: N/A;   TEMPORARY PACEMAKER N/A 02/10/2020   Procedure: TEMPORARY PACEMAKER;  Surgeon: Lennette Bihari, MD;  Location: [facility] INVASIVE CV LAB;  Service: Cardiovascular;  Laterality: N/A;    Family History  Problem Relation Age of Onset   Hypertension Mother    Diabetes Mother    Heart disease Mother    Hypertension Father    Diabetes Father    Heart disease Father    Stroke Maternal Grandfather    Other Neg Hx    Breast cancer Neg Hx     Social History  Socioeconomic History   Marital status: Significant Other    Spouse name: Not on file   Number of children: Not on file   Years of education: Not on file   Highest education level: Not on file  Occupational History   Not on file  Tobacco Use   Smoking status: Every Day    Packs/day: 0.50    Years: 20.00    Additional pack years: 0.00    Total pack years: 10.00    Types: Cigarettes    Last attempt to quit: 07/01/2021    Years since quitting: 1.6   Smokeless tobacco: Never  Vaping Use   Vaping Use: Never used  Substance and Sexual Activity   Alcohol use: No   Drug use: No    Sexual activity: Yes    Birth control/protection: None  Other Topics Concern   Not on file  Social History Narrative   Not on file   Social Determinants of Health   Financial Resource Strain: High Risk (01/10/2023)   Overall Financial Resource Strain (CARDIA)    Difficulty of Paying Living Expenses: Hard  Food Insecurity: Food Insecurity Present (01/26/2023)   Hunger Vital Sign    Worried About Running Out of Food in the Last Year: Often true    Ran Out of Food in the Last Year: Often true  Transportation Needs: Unmet Transportation Needs (04/01/2022)   PRAPARE - Administrator, Civil ServiceTransportation    Lack of Transportation (Medical): Yes    Lack of Transportation (Non-Medical): Yes  Physical Activity: Not on file  Stress: Not on file  Social Connections: Not on file  Intimate Partner Violence: Not on file    Outpatient Medications Prior to Visit  Medication Sig Dispense Refill   acetaminophen (TYLENOL) 325 MG tablet Take 650 mg by mouth every 6 (six) hours as needed (pain).     atorvastatin (LIPITOR) 20 MG tablet Take 1 tablet (20 mg total) by mouth daily. 90 tablet 3   Blood Pressure Monitoring (BLOOD PRESSURE MONITOR 7) DEVI Measure blood pressure daily 1 each 0   carvedilol (COREG) 12.5 MG tablet Take 1 tablet (12.5 mg total) by mouth 2 (two) times daily with a meal. 120 tablet 1   Continuous Blood Gluc Sensor (DEXCOM G6 SENSOR) MISC Use to check blood sugar three times daily. Change sensors once every 10 days. Dx code: E11.42 3 each 3   Continuous Blood Gluc Transmit (DEXCOM G6 TRANSMITTER) MISC Use to check blood sugar three times daily. Change once every 90 days. Dx code: E11.42 1 each 3   dapagliflozin propanediol (FARXIGA) 10 MG TABS tablet Take 1 tablet (10 mg total) by mouth daily before breakfast. 30 tablet 11   furosemide (LASIX) 20 MG tablet Take 2 tablets (40 mg total) by mouth daily. Patient takes 2 tablets by mouth in the morning and 1 tablet at night. 180 tablet 3   hydrALAZINE  (APRESOLINE) 25 MG tablet Take 1 tablet (25 mg total) by mouth 3 (three) times daily. (Patient taking differently: Take 25 mg by mouth 2 (two) times daily.) 270 tablet 3   Insulin Disposable Pump (OMNIPOD 5 G6 POD, GEN 5,) MISC 1 Device by Does not apply route every other day. 9 each 3   insulin glargine (LANTUS SOLOSTAR) 100 UNIT/ML Solostar Pen Inject 60 Units into the skin daily. (Patient taking differently: Inject 60 Units into the skin daily. Patient takes when not using her insulin pump) 60 mL 3   insulin lispro (HUMALOG) 100 UNIT/ML injection Max daily  125 units a day per pump 40 mL 11   Insulin Pen Needle 31G X 5 MM MISC Use as directed in the morning, at noon, in the evening, and at bedtime. 400 each 3   isosorbide mononitrate (ISMO) 20 MG tablet Take 1 tablet (20 mg total) by mouth daily. 90 tablet 3   latanoprost (XALATAN) 0.005 % ophthalmic solution Place 1 drop into both eyes at bedtime.     losartan (COZAAR) 100 MG tablet Take 1 tablet (100 mg total) by mouth daily. 90 tablet 2   spironolactone (ALDACTONE) 25 MG tablet Take 1 tablet (25 mg total) by mouth daily. 90 tablet 3   varenicline (CHANTIX CONTINUING MONTH PAK) 1 MG tablet Take 1 tablet (1 mg total) by mouth 2 (two) times daily. 60 tablet 1   No facility-administered medications prior to visit.    Allergies  Allergen Reactions   Lamictal [Lamotrigine] Itching   Strawberry Extract Hives   Sulfa Antibiotics Hives and Itching    ROS Review of Systems  Constitutional:  Positive for unexpected weight change. Negative for chills, diaphoresis and fever.  HENT: Negative.  Negative for congestion, ear pain, hearing loss, nosebleeds, postnasal drip, rhinorrhea, sinus pressure, sore throat, tinnitus, trouble swallowing and voice change.   Eyes: Negative.  Negative for photophobia and redness.  Respiratory: Negative.  Negative for apnea, cough, choking, chest tightness, shortness of breath, wheezing and stridor.   Cardiovascular:  Negative.  Negative for chest pain, palpitations and leg swelling.  Gastrointestinal: Negative.  Negative for abdominal distention, abdominal pain, blood in stool, constipation, diarrhea, nausea and vomiting.  Endocrine: Negative for polydipsia.  Genitourinary:  Positive for dysuria, urgency and vaginal discharge. Negative for flank pain, frequency and hematuria.  Musculoskeletal: Negative.  Negative for arthralgias, back pain, myalgias and neck pain.  Skin: Negative.  Negative for rash.  Allergic/Immunologic: Negative.  Negative for environmental allergies and food allergies.  Neurological: Negative.  Negative for dizziness, tremors, seizures, syncope, weakness and headaches.  Hematological: Negative.  Negative for adenopathy. Does not bruise/bleed easily.  Psychiatric/Behavioral: Negative.  Negative for agitation, sleep disturbance and suicidal ideas. The patient is not nervous/anxious.       Objective:    Physical Exam Vitals reviewed.  Constitutional:      Appearance: Normal appearance. She is well-developed. She is obese. She is not diaphoretic.  HENT:     Head: Normocephalic and atraumatic.     Nose: No nasal deformity, septal deviation, mucosal edema or rhinorrhea.     Right Sinus: No maxillary sinus tenderness or frontal sinus tenderness.     Left Sinus: No maxillary sinus tenderness or frontal sinus tenderness.     Mouth/Throat:     Mouth: Mucous membranes are moist.     Pharynx: No oropharyngeal exudate.     Comments: There is a slight ulceration inside the right cheek mucosa and there is a fractured molar which likely has caused the injury Eyes:     General: No scleral icterus.    Conjunctiva/sclera: Conjunctivae normal.     Pupils: Pupils are equal, round, and reactive to light.  Neck:     Thyroid: No thyromegaly.     Vascular: No carotid bruit or JVD.     Trachea: Trachea normal. No tracheal tenderness or tracheal deviation.  Cardiovascular:     Rate and Rhythm:  Normal rate and regular rhythm.     Chest Wall: PMI is not displaced.     Pulses: Normal pulses. No decreased pulses.  Heart sounds: Normal heart sounds, S1 normal and S2 normal. Heart sounds not distant. No murmur heard.    No systolic murmur is present.     No diastolic murmur is present.     No friction rub. No gallop. No S3 or S4 sounds.  Pulmonary:     Effort: No tachypnea, accessory muscle usage or respiratory distress.     Breath sounds: No stridor. No decreased breath sounds, wheezing, rhonchi or rales.  Chest:     Chest wall: No tenderness.  Abdominal:     General: Bowel sounds are normal. There is no distension.     Palpations: Abdomen is soft. Abdomen is not rigid.     Tenderness: There is no abdominal tenderness. There is no guarding or rebound.  Musculoskeletal:        General: Normal range of motion.     Cervical back: Normal range of motion and neck supple. No edema, erythema or rigidity. No muscular tenderness. Normal range of motion.     Right lower leg: No tenderness. No edema.     Comments: Orthotic on left below-knee amputation left leg functioning well   Lymphadenopathy:     Head:     Right side of head: No submental or submandibular adenopathy.     Left side of head: No submental or submandibular adenopathy.     Cervical: No cervical adenopathy.  Skin:    General: Skin is warm and dry.     Coloration: Skin is not pale.     Findings: No rash.     Nails: There is no clubbing.  Neurological:     General: No focal deficit present.     Mental Status: She is alert and oriented to person, place, and time.     Sensory: No sensory deficit.  Psychiatric:        Mood and Affect: Mood normal.        Speech: Speech normal.        Behavior: Behavior normal.        Thought Content: Thought content normal.        Judgment: Judgment normal.     There were no vitals taken for this visit. Wt Readings from Last 3 Encounters:  01/31/23 210 lb 9.6 oz (95.5 kg)   01/13/23 216 lb 9.6 oz (98.2 kg)  01/07/23 213 lb 6.4 oz (96.8 kg)     Health Maintenance Due  Topic Date Due   Fecal DNA (Cologuard)  Never done   OPHTHALMOLOGY EXAM  03/11/2023     There are no preventive care reminders to display for this patient.  Lab Results  Component Value Date   TSH 4.396 02/11/2020   Lab Results  Component Value Date   WBC 8.0 11/23/2022   HGB 10.9 (L) 11/23/2022   HCT 33.9 (L) 11/23/2022   MCV 91.1 11/23/2022   PLT 198 11/23/2022   Lab Results  Component Value Date   NA 136 01/13/2023   K 4.2 01/13/2023   CO2 19 (L) 01/13/2023   GLUCOSE 300 (H) 01/13/2023   BUN 20 01/13/2023   CREATININE 1.10 (H) 01/13/2023   BILITOT <0.2 01/13/2023   ALKPHOS 95 01/13/2023   AST 14 01/13/2023   ALT 11 01/13/2023   PROT 6.8 01/13/2023   ALBUMIN 3.8 (L) 01/13/2023   CALCIUM 8.5 (L) 01/13/2023   ANIONGAP 10 12/21/2022   EGFR 62 01/13/2023   Lab Results  Component Value Date   CHOL 158 01/13/2023   Lab Results  Component Value Date   HDL 52 01/13/2023   Lab Results  Component Value Date   LDLCALC 88 01/13/2023   Lab Results  Component Value Date   TRIG 95 01/13/2023   Lab Results  Component Value Date   CHOLHDL 3.0 01/13/2023   Lab Results  Component Value Date   HGBA1C 8.8 (A) 12/15/2022      Assessment & Plan:   Problem List Items Addressed This Visit   None Patient needs to process Cologuard kit patient needs blood pressure increase control return visit with clinical pharmacy Follow-up: No follow-ups on file.  38 minutes spent on this exam complex decision making is high multiple systems assessed  Shan Levans, MD

## 2023-03-14 ENCOUNTER — Encounter
Payer: Medicaid Other | Attending: Physical Medicine and Rehabilitation | Admitting: Physical Medicine and Rehabilitation

## 2023-03-14 ENCOUNTER — Encounter: Payer: Self-pay | Admitting: Physical Medicine and Rehabilitation

## 2023-03-14 VITALS — BP 151/84 | HR 79 | Ht 64.0 in | Wt 216.0 lb

## 2023-03-14 DIAGNOSIS — I10 Essential (primary) hypertension: Secondary | ICD-10-CM | POA: Diagnosis not present

## 2023-03-14 DIAGNOSIS — M79606 Pain in leg, unspecified: Secondary | ICD-10-CM | POA: Diagnosis not present

## 2023-03-14 NOTE — Patient Instructions (Signed)
HTN: -BP is ___today.  -Advised checking BP daily at home and logging results to bring into follow-up appointment with PCP and myself. -Reviewed BP meds today.  -Advised regarding healthy foods that can help lower blood pressure and provided with a list: 1) citrus foods- high in vitamins and minerals 2) salmon and other fatty fish - reduces inflammation and oxylipins 3) swiss chard (leafy green)- high level of nitrates 4) pumpkin seeds- one of the best natural sources of magnesium 5) Beans and lentils- high in fiber, magnesium, and potassium 6) Berries- high in flavonoids 7) Amaranth (whole grain, can be cooked similarly to rice and oats)- high in magnesium and fiber 8) Pistachios- even more effective at reducing BP than other nuts 9) Carrots- high in phenolic compounds that relax blood vessels and reduce inflammation 10) Celery- contain phthalides that relax tissues of arterial walls 11) Tomatoes- can also improve cholesterol and reduce risk of heart disease 12) Broccoli- good source of magnesium, calcium, and potassium 13) Greek yogurt: high in potassium and calcium 14) Herbs and spices: Celery seed, cilantro, saffron, lemongrass, black cumin, ginseng, cinnamon, cardamom, sweet basil, and ginger 15) Chia and flax seeds- also help to lower cholesterol and blood sugar 16) Beets- high levels of nitrates that relax blood vessels  17) spinach and bananas- high in potassium  -Provided lise of supplements that can help with hypertension:  1) magnesium: one high quality brand is Bioptemizers since it contains all 7 types of magnesium, otherwise over the counter magnesium gluconate 400mg is a good option 2) B vitamins 3) vitamin D 4) potassium 5) CoQ10 6) L-arginine 7) Vitamin C 8) Beetroot -Educated that goal BP is 120/80. -Made goal to incorporate some of the above foods into diet.   

## 2023-03-14 NOTE — Progress Notes (Signed)
Subjective:    Patient ID: Sarah Long, female    DOB: [birth year], 52 y.o.   MRN: [id number]  HPI Sarah Long is a 52 year old woman who presents for follow-up left BKA phantom limb pain, menstrual pain.   1) Type 2 DM -she has an appointment with an endocrinologist -has a dexcom and omnipod to given her 2U insulin every hour -CBGs have been very well controlled.  -she has tingling and numbness in her right foot -she is getting phantom limb pain in left leg.  -she has follow-up with Dr. Lajoyce Cornersuda  and PCP -she is willing to try nerve medications -she has neuropathic pain throughout her right lower extremity -does not require any refills today -she still has neuropathy in her other foot.   2) Constipation: -she does not lie the Miralax -she is worried about being constipated wit the iron.   3) Left BKA phantom limb pain -has started again -Qutenza was very helpful in the past and resulted I complete relief but it burned severely when she went home so she prefers another option -tolerating prosthesis well  4) Right lower extremity edema -compression garments ordered by Dr. Lajoyce Cornersuda -she does not like how much the lasix makes her urinate -does not require any refills today -improved with lasix  5) Menarche -returned and can be painful  6) HTN -BP up to 151/84 today  Pain Inventory Average Pain 8 Pain Right Now 5 My pain is burning, stabbing, and aching  In the last 24 hours, has pain interfered with the following? General activity 0 Relation with others 0 Enjoyment of life 7 What TIME of day is your pain at its worst? night Sleep (in general) Poor  Pain is worse with: sitting, inactivity, and some activites Pain improves with: medication Relief from Meds: 6     Family History  Problem Relation Age of Onset   Hypertension Mother    Diabetes Mother    Heart disease Mother    Hypertension Father    Diabetes Father    Heart disease Father    Stroke Maternal  Grandfather    Other Neg Hx    Breast cancer Neg Hx    Social History   Socioeconomic History   Marital status: Significant Other    Spouse name: Not on file   Number of children: Not on file   Years of education: Not on file   Highest education level: Not on file  Occupational History   Not on file  Tobacco Use   Smoking status: Every Day    Packs/day: 0.50    Years: 20.00    Additional pack years: 0.00    Total pack years: 10.00    Types: Cigarettes    Last attempt to quit: 07/01/2021    Years since quitting: 1.7   Smokeless tobacco: Never  Vaping Use   Vaping Use: Never used  Substance and Sexual Activity   Alcohol use: No   Drug use: No   Sexual activity: Yes    Birth control/protection: None  Other Topics Concern   Not on file  Social History Narrative   Not on file   Social Determinants of Health   Financial Resource Strain: High Risk (01/10/2023)   Overall Financial Resource Strain (CARDIA)    Difficulty of Paying Living Expenses: Hard  Food Insecurity: Food Insecurity Present (01/26/2023)   Hunger Vital Sign    Worried About Running Out of Food in the Last Year: Often true  Ran Out of Food in the Last Year: Often true  Transportation Needs: Unmet Transportation Needs (04/01/2022)   PRAPARE - Administrator, Civil Service (Medical): Yes    Lack of Transportation (Non-Medical): Yes  Physical Activity: Not on file  Stress: Not on file  Social Connections: Not on file   Past Surgical History:  Procedure Laterality Date   ABDOMINAL AORTOGRAM W/LOWER EXTREMITY N/A 06/17/2021   Procedure: ABDOMINAL AORTOGRAM W/LOWER EXTREMITY;  Surgeon: Iran Ouch, MD;  Location: [facility];  Service: Cardiovascular;  Laterality: N/A;   AMPUTATION Left 06/27/2021   Procedure: AMPUTATION BELOW KNEE;  Surgeon: Nadara Mustard, MD;  Location: [facility];  Service: Orthopedics;  Laterality: Left;   AMPUTATION Right 09/04/2021   Procedure: RIGHT GREAT TOE  AMPUTATION;  Surgeon: Nadara Mustard, MD;  Location[facility] OR;  Service: Orthopedics;  Laterality: Right;   CESAREAN SECTION  1991   LEFT HEART CATH AND CORONARY ANGIOGRAPHY N/A 02/10/2020   Procedure: LEFT HEART CATH AND CORONARY ANGIOGRAPHY;  Surgeon: Lennette Bihari, MD;  Location: [facility];  Service: Cardiovascular;  Laterality: N/A;   PACEMAKER IMPLANT N/A 02/13/2020   Procedure: PACEMAKER IMPLANT;  Surgeon: Regan Lemming, MD;  Location: [facility];  Service: Cardiovascular;  Laterality: N/A;   TEMPORARY PACEMAKER N/A 02/10/2020   Procedure: TEMPORARY PACEMAKER;  Surgeon: Lennette Bihari, MD;  Location: [facility] INVASIVE CV LAB;  Service: Cardiovascular;  Laterality: N/A;   Past Medical History:  Diagnosis Date   Abnormal Pap smear 1998   Abnormal vaginal bleeding 12/19/2011   Acute urinary retention 06/26/2021   Arthritis    Bacterial infection    Bipolar 1 disorder    Blister of second toe of left foot 11/21/2016   Candida vaginitis 07/2007   Depression    recently added wellbutrin-has not taken yet for bipolar   Diabetes in pregnancy    Diabetes mellitus    nph 20U qam and qpm, regular with meals   Diabetic ketoacidosis without coma associated with type 2 diabetes mellitus    Fibroid    Galactorrhea of right breast 2008   GERD (gastroesophageal reflux disease)    H/O amenorrhea 07/2007   H/O dizziness 10/14/2011   H/O dysmenorrhea 2010   H/O menorrhagia 10/14/2011   H/O varicella    Headache(784.0)    Heavy vaginal bleeding due to contraceptive injection use 10/12/2011   Depo Provera   Herpes    Homelessness 11/19/2021   HSV-2 infection 01/03/2009   Hx: UTI (urinary tract infection) 2009   Hypertension    on aldomet   Hypoalbuminemia due to protein-calorie malnutrition 06/26/2021   Increased BMI 2010   Irregular uterine bleeding 04/04/2012   Pt has mirena    Obesity 10/14/2011   Oligomenorrhea 07/2007   Osteomyelitis of great toe of right foot  03/22/2022   Pelvic pain in female    Presence of permanent cardiac pacemaker    Preterm labor    Trichomonas    Yeast infection    Ht  (1.626 m)   BMI 36.15 kg/m   Opioid Risk Score:   Fall Risk Score:  `1  Depression screen [facility] 2/9     01/13/2023   10:07 AM 11/11/2022    4:09 PM 09/07/2022    9:46 AM 08/10/2022    2:50 PM 03/22/2022   10:33 AM 03/02/2022   12:57 PM 11/18/2021   10:59 AM  Depression screen PHQ 2/9  Decreased  Interest 2 0 0 1 1 0 2  Down, Depressed, Hopeless 2 1 0 1 1 0 2  PHQ - 2 Score 4 1 0 2 2 0 4  Altered sleeping 3 0  3 3  2   Tired, decreased energy 3 0  2 2  2   Change in appetite 2 0  1 2  1   Feeling bad or failure about yourself  0 0  0 3  1  Trouble concentrating 1 0  1 0  0  Moving slowly or fidgety/restless 0 0  2 0  0  Suicidal thoughts 0 0  0 0  0  PHQ-9 Score 13 1  11 12  10   Difficult doing work/chores  Somewhat difficult           Review of Systems  Constitutional: Negative.   HENT: Negative.    Eyes: Negative.   Respiratory: Negative.    Cardiovascular: Negative.   Gastrointestinal: Negative.   Endocrine: Negative.   Genitourinary: Negative.   Musculoskeletal:        Pain in left leg  Skin: Negative.   Allergic/Immunologic: Negative.   Neurological:  Positive for weakness and numbness.  Hematological: Negative.   Psychiatric/Behavioral:  Positive for dysphoric mood. The patient is nervous/anxious.        Objective:   Physical Exam Gen: no distress, normal appearing, weight 215 lbs, BMI 36.90, BMI 169/108 HEENT: oral mucosa pink and moist, NCAT Cardio: Reg rate Chest: normal effort, normal rate of breathing Abd: soft, non-distended Ext: no edema Psych: pleasant, normal affect Skin: intact Neuro: Alert and oriented x3 Musculoskeletal: Left BKA healed well, ambulating with prosthetic well. Amputated digits of right foot    Assessment & Plan:  Sarah Long is a 52 year old woman who presents for hospital follow-up after  CIR admission for left BKA  1) L BKA with phantom limb pain -continue follow-up with Dr. Lajoyce Corners, healing well D/c gabapentin -Discussed Qutenza as an option for neuropathic pain control. Discussed that this is a capsaicin patch, stronger than capsaicin cream. Discussed that it is currently approved for diabetic peripheral neuropathy and post-herpetic neuralgia, but that it has also shown benefit in treating other forms of neuropathy. Provided patient with link to site to learn more about the patch: https://www.clark.biz/. Discussed that the patch would be placed in office and benefits usually last 3 months. Discussed that unintended exposure to capsaicin can cause severe irritation of eyes, mucous membranes, respiratory tract, and skin, but that Qutenza is a local treatment and does not have the systemic side effects of other nerve medications. Discussed that there may be pain, itching, erythema, and decreased sensory function associated with the application of Qutenza. Side effects usually subside within 1 week. A cold pack of analgesic medications can help with these side effects. Blood pressure can also be increased due to pain associated with administration of the patch.  -will complete social security forms, asked that these be resent -discussed her current challenges at work and that she is currently working three hours per day. Discussed her difficulty doing stairs, shortness of breath -Provided with a pain relief journal and discussed that it contains foods and lifestyle tips to naturally help to improve pain. Discussed that these lifestyle strategies are also very good for health unlike some medications which can have negative side effects. Discussed that the act of keeping a journal can be therapeutic and helpful to realize patterns what helps to trigger and alleviate pain.   Prescribed Zynex  Nexwave -discussed that she cannot take ibuprofen due to her heart disease -discussed that she can try  increasing tylenol to 1,000mg  TID  2) Type 2 DM with peripheral neuropathy- improved -improved, d/c qutenza.  -will look into medically tailored meals for her.  -Recommended 1 glass water with 1 TB apple cider vinegar before meals to reduce CBG spike, has additional health benefits, drink with straw to protect enamel.  -discussed her difficulty getting food from her family.  Discussed that honey has been shown to help diabetes, but that it does have fructose in it.    2) Anemia -discussed what it means to be anemic -discussed sources of iron rih fods.   3)Constipation:  -Provided list of following foods that help with constipation and highlighted a few: 1) prunes- contain high amounts of fiber.  2) apples- has a form of dietary fiber called pectin that accelerates stool movement and increases beneficial gut bacteria 3) pears- in addition to fiber, also high in fructose and sorbitol which have laxative effect 4) figs- contain an enzyme ficin which helps to speed colonic transit 5) kiwis- contain an enzyme actinidin that improves gut motility and reduces constipation 6) oranges- rich in pectin (like apples) 7) grapefruits- contain a flavanol naringenin which has a laxative effect 8) vegetables- rich in fiber and also great sources of folate, vitamin C, and K 9) artichoke- high in inulin, prebiotic great for the microbiome 10) chicory- increases stool frequency and softness (can be added to coffee) 11) rhubarb- laxative effect 12) sweet potato- high fiber 13) beans, peas, and lentils- contain both soluble and insoluble fiber 14) chia seeds- improves intestinal health and gut flora 15) flaxseeds- laxative effect 16) whole grain rye bread- high in fiber 17) oat bran- high in soluble and insoluble fiber 18) kefir- softens stools -recommended to try at least one of these foods every day.  -drink 6-8 glasses of water per day -walk regularly, especially after meals.   4) HTN: -BP is  151/84 today.  -Advised checking BP daily at home and logging results to bring into follow-up appointment with PCP and myself. -Reviewed BP meds today.  -Advised regarding healthy foods that can help lower blood pressure and provided with a list: 1) citrus foods- high in vitamins and minerals 2) salmon and other fatty fish - reduces inflammation and oxylipins 3) swiss chard (leafy green)- high level of nitrates 4) pumpkin seeds- one of the best natural sources of magnesium 5) Beans and lentils- high in fiber, magnesium, and potassium 6) Berries- high in flavonoids 7) Amaranth (whole grain, can be cooked similarly to rice and oats)- high in magnesium and fiber 8) Pistachios- even more effective at reducing BP than other nuts 9) Carrots- high in phenolic compounds that relax blood vessels and reduce inflammation 10) Celery- contain phthalides that relax tissues of arterial walls 11) Tomatoes- can also improve cholesterol and reduce risk of heart disease 12) Broccoli- good source of magnesium, calcium, and potassium 13) Greek yogurt: high in potassium and calcium 14) Herbs and spices: Celery seed, cilantro, saffron, lemongrass, black cumin, ginseng, cinnamon, cardamom, sweet basil, and ginger 15) Chia and flax seeds- also help to lower cholesterol and blood sugar 16) Beets- high levels of nitrates that relax blood vessels  17) spinach and bananas- high in potassium  -Provided lise of supplements that can help with hypertension:  1) magnesium: one high quality brand is Bioptemizers since it contains all 7 types of magnesium, otherwise over the counter magnesium gluconate 400mg  is  a good option 2) B vitamins 3) vitamin D 4) potassium 5) CoQ10 6) L-arginine 7) Vitamin C 8) Beetroot -Educated that goal BP is 120/80. -Made goal to incorporate some of the above foods into diet.    5) Allergies -recommended bee pollen   6) Obesity: -Educated that current weight is 215 lbs and current BMI  is 36.90 -Educated regarding health benefits of weight loss- for pain, general health, chronic disease prevention, immune health, mental health.  -Will monitor weight every visit.  -Consider Roobois tea daily.  -Discussed the benefits of intermittent fasting. -Discussed foods that can assist in weight loss: 1) leafy greens- high in fiber and nutrients 2) dark chocolate- improves metabolism (if prefer sweetened, best to sweeten with honey instead of sugar).  3) cruciferous vegetables- high in fiber and protein 4) full fat yogurt: high in healthy fat, protein, calcium, and probiotics 5) apples- high in a variety of phytochemicals 6) nuts- high in fiber and protein that increase feelings of fullness 7) grapefruit: rich in nutrients, antioxidants, and fiber (not to be taken with anticoagulation) 8) beans- high in protein and fiber 9) salmon- has high quality protein and healthy fats 10) green tea- rich in polyphenols 11) eggs- rich in choline and vitamin D 12) tuna- high protein, boosts metabolism 13) avocado- decreases visceral abdominal fat 14) chicken (pasture raised): high in protein and iron 15) blueberries- reduce abdominal fat and cholesterol 16) whole grains- decreases calories retained during digestion, speeds metabolism 17) chia seeds- curb appetite 18) chilies- increases fat metabolism  -Discussed supplements that can be used:  1) Metatrim 400mg  BID 30 minutes before breakfast and dinner  2) Sphaeranthus indicus and Garcinia mangostana (combinations of these and #1 can be found in capsicum and zychrome  3) green coffee bean extract 400mg  twice per day or Irvingia (african mango) 150 to 300mg  twice per day.  7) Bilateral lower extremity edema 2/2 CHF: -continue Farxiga and Lasix

## 2023-03-15 ENCOUNTER — Ambulatory Visit: Payer: Medicaid Other | Attending: Critical Care Medicine | Admitting: Critical Care Medicine

## 2023-03-15 ENCOUNTER — Telehealth: Payer: Self-pay | Admitting: Critical Care Medicine

## 2023-03-15 ENCOUNTER — Encounter: Payer: Self-pay | Admitting: Critical Care Medicine

## 2023-03-15 ENCOUNTER — Other Ambulatory Visit: Payer: Self-pay

## 2023-03-15 VITALS — BP 163/99 | HR 81 | Wt 211.2 lb

## 2023-03-15 DIAGNOSIS — E1165 Type 2 diabetes mellitus with hyperglycemia: Secondary | ICD-10-CM | POA: Diagnosis not present

## 2023-03-15 DIAGNOSIS — I5042 Chronic combined systolic (congestive) and diastolic (congestive) heart failure: Secondary | ICD-10-CM | POA: Diagnosis not present

## 2023-03-15 DIAGNOSIS — I739 Peripheral vascular disease, unspecified: Secondary | ICD-10-CM | POA: Diagnosis not present

## 2023-03-15 DIAGNOSIS — I10 Essential (primary) hypertension: Secondary | ICD-10-CM | POA: Diagnosis not present

## 2023-03-15 DIAGNOSIS — E1129 Type 2 diabetes mellitus with other diabetic kidney complication: Secondary | ICD-10-CM

## 2023-03-15 DIAGNOSIS — Z794 Long term (current) use of insulin: Secondary | ICD-10-CM | POA: Diagnosis not present

## 2023-03-15 DIAGNOSIS — R809 Proteinuria, unspecified: Secondary | ICD-10-CM

## 2023-03-15 DIAGNOSIS — E7800 Pure hypercholesterolemia, unspecified: Secondary | ICD-10-CM

## 2023-03-15 LAB — GLUCOSE, POCT (MANUAL RESULT ENTRY): POC Glucose: 273 mg/dl — AB (ref 70–99)

## 2023-03-15 MED ORDER — INSULIN PEN NEEDLE 31G X 5 MM MISC: 1.0000 | Freq: Four times a day (QID) | 3 refills | Status: DC

## 2023-03-15 MED ORDER — FUROSEMIDE 20 MG PO TABS: 40.0000 mg | ORAL_TABLET | Freq: Every day | ORAL | 3 refills | Status: DC

## 2023-03-15 MED ORDER — SPIRONOLACTONE 25 MG PO TABS: 25.0000 mg | ORAL_TABLET | Freq: Every day | ORAL | 3 refills | Status: DC

## 2023-03-15 MED ORDER — LANTUS SOLOSTAR 100 UNIT/ML ~~LOC~~ SOPN: 60.0000 [IU] | PEN_INJECTOR | Freq: Every day | SUBCUTANEOUS | 3 refills | Status: DC

## 2023-03-15 MED ORDER — VARENICLINE TARTRATE 1 MG PO TABS: 1.0000 mg | ORAL_TABLET | Freq: Two times a day (BID) | ORAL | 1 refills | Status: DC

## 2023-03-15 MED ORDER — INSULIN LISPRO 100 UNIT/ML IJ SOLN: INTRAMUSCULAR | 11 refills | Status: DC

## 2023-03-15 MED ORDER — ATORVASTATIN CALCIUM 20 MG PO TABS: 20.0000 mg | ORAL_TABLET | Freq: Every day | ORAL | 3 refills | Status: DC

## 2023-03-15 MED ORDER — LOSARTAN POTASSIUM 100 MG PO TABS: 100.0000 mg | ORAL_TABLET | Freq: Every day | ORAL | 2 refills | Status: DC

## 2023-03-15 MED ORDER — CARVEDILOL 12.5 MG PO TABS: 12.5000 mg | ORAL_TABLET | Freq: Two times a day (BID) | ORAL | 1 refills | Status: DC

## 2023-03-15 MED ORDER — ISOSORBIDE MONONITRATE 20 MG PO TABS: 20.0000 mg | ORAL_TABLET | Freq: Every day | ORAL | 3 refills | Status: DC

## 2023-03-15 MED ORDER — FUROSEMIDE 20 MG PO TABS: ORAL_TABLET | ORAL | 3 refills | Status: DC

## 2023-03-15 MED ORDER — HYDRALAZINE HCL 25 MG PO TABS: 25.0000 mg | ORAL_TABLET | Freq: Three times a day (TID) | ORAL | 3 refills | Status: DC

## 2023-03-15 NOTE — Assessment & Plan Note (Signed)
Poor control we will resume insulin program including Omnipod

## 2023-03-15 NOTE — Telephone Encounter (Signed)
Rx modified

## 2023-03-15 NOTE — Assessment & Plan Note (Addendum)
Does have Sarah Long we will maintain

## 2023-03-15 NOTE — Patient Instructions (Signed)
Congratulated is on achieving housing  All medications refilled sent to [facility] today please get the medicines today they are instructed to fill today  Return to see [facility] 3 weeks we will have you recheck labs at that visit  Return to Dr. Delford Field 6 weeks

## 2023-03-15 NOTE — Addendum Note (Signed)
Addended by: Shan Levans E on: 03/15/2023 05:11 PM   Modules accepted: Orders

## 2023-03-15 NOTE — Assessment & Plan Note (Signed)
Progressive changes right foot will refer back to Dr. Lajoyce Corners

## 2023-03-15 NOTE — Telephone Encounter (Signed)
Called patient unable to make contact or leave voicemail

## 2023-03-15 NOTE — Telephone Encounter (Signed)
Copied from CRM [id number]. Topic: General - Other >> Mar 15, 2023 12:30 PM Franchot Heidelberg wrote: Reason for CRM: The pharmacy called requesting medication clarification for the furosemide 20 MG tablets. There are conflicting directions. Please advise.

## 2023-03-15 NOTE — Assessment & Plan Note (Signed)
Resume statin. 

## 2023-03-15 NOTE — Assessment & Plan Note (Signed)
Poorly controlled due to lack of access to medications refills given

## 2023-03-17 DIAGNOSIS — H40013 Open angle with borderline findings, low risk, bilateral: Secondary | ICD-10-CM | POA: Diagnosis not present

## 2023-03-24 ENCOUNTER — Encounter: Payer: Self-pay | Admitting: Orthopedic Surgery

## 2023-03-24 ENCOUNTER — Ambulatory Visit (INDEPENDENT_AMBULATORY_CARE_PROVIDER_SITE_OTHER): Payer: Medicaid Other | Admitting: Orthopedic Surgery

## 2023-03-24 DIAGNOSIS — Z89512 Acquired absence of left leg below knee: Secondary | ICD-10-CM

## 2023-03-24 DIAGNOSIS — M6701 Short Achilles tendon (acquired), right ankle: Secondary | ICD-10-CM

## 2023-03-24 DIAGNOSIS — Z89421 Acquired absence of other right toe(s): Secondary | ICD-10-CM

## 2023-03-24 NOTE — Progress Notes (Signed)
Okay  Make sure the bone looks  Office Visit Note   [name]           Date of Birth: [birth year]           MRN: [id number] Visit Date: 03/24/2023              Requested by: [name], MD [address] [place],  [place] [address] PCP: [name], MD  Chief Complaint  Patient presents with   Right Foot - Follow-up    2nd toe ulcer       HPI: Patient is a 52 year old woman with left transtibial amputation right foot great toe amputation with increased clawing and callus formation and swelling to the right foot second toe.  Assessment & Plan: Visit Diagnoses:  1. Left below-knee amputee   2. Right toe amputee   3. Achilles tendon contracture, right     Plan: Recommended stiff soled sneakers and Achilles stretching.  Follow-Up Instructions: Return if symptoms worsen or fail to improve.   Ortho Exam  Patient is alert, oriented, no adenopathy, well-dressed, normal affect, normal respiratory effort. Examination patient has a palpable dorsalis pedis pulse no arterial insufficiency she does have Achilles contracture with dorsiflexion 20 degrees short of neutral.  She has clawing of the second toe with callus over the tip this was removed there is no deep abscess or wound.  No sausage digit swelling.  No cellulitis.  Imaging: No results found. No images are attached to the encounter.  Labs: Lab Results  Component Value Date   HGBA1C 8.8 (A) 12/15/2022   HGBA1C 8.5 (A) 06/14/2022   HGBA1C 8.7 (H) 04/21/2022   LABURIC 4.1 10/21/2016   LABURIC 4.2 11/20/2010   REPTSTATUS 12/17/2021 FINAL 12/15/2021   GRAMSTAIN NO WBC SEEN NO ORGANISMS SEEN  08/19/2021   CULT >=100,000 COLONIES/mL ESCHERICHIA COLI (A) 12/15/2021   LABORGA ESCHERICHIA COLI (A) 12/15/2021     Lab Results  Component Value Date   ALBUMIN 3.8 (L) 01/13/2023   ALBUMIN 3.8 10/07/2021   ALBUMIN 3.4 (L) 07/30/2021    Lab Results  Component Value Date   MG 1.7  06/28/2021   MG 1.8 06/27/2021   MG 1.7 06/27/2021   Lab Results  Component Value Date   VD25OH 24.1 (L) 03/11/2020   VD25OH <4.20 (L) 02/15/2020    No results found for: "PREALBUMIN"    Latest Ref Rng & Units 11/23/2022    9:27 PM 07/21/2022    3:33 AM 07/03/2022   12:50 PM  CBC EXTENDED  WBC 4.0 - 10.5 K/uL 8.0  6.8  3.3   RBC 3.87 - 5.11 MIL/uL 3.72  3.31  3.76   Hemoglobin 12.0 - 15.0 g/dL 21.3  9.7  08.6   HCT 57.8 - 46.0 % 33.9  29.8  33.3   Platelets 150 - 400 K/uL 198  241  165   NEUT# 1.7 - 7.7 K/uL  4.3    Lymph# 0.7 - 4.0 K/uL  1.9       There is no height or weight on file to calculate BMI.  Orders:  No orders of the defined types were placed in this encounter.  No orders of the defined types were placed in this encounter.    Procedures: No procedures performed  Clinical Data: No additional findings.  ROS:  All other systems negative, except as noted in the HPI. Review of Systems  Objective: Vital Signs: There were no vitals taken  for this visit.  Specialty Comments:  No specialty comments available.  PMFS History: Patient Active Problem List   Diagnosis Date Noted   Dysuria 01/13/2023   Type 2 diabetes mellitus with microalbuminuria, with long-term current use of insulin 12/15/2022   Peripheral vascular disease, unspecified 03/22/2022   Type 2 diabetes mellitus with diabetic polyneuropathy, with long-term current use of insulin 02/03/2022   Type 2 diabetes mellitus with hyperglycemia, with long-term current use of insulin 02/03/2022   Microalbuminuria 02/03/2022   Right great toe amputee 11/03/2021   Class 2 severe obesity due to excess calories with serious comorbidity and body mass index (BMI) of 36.0 to 36.9 in adult 10/15/2021   Insomnia 10/07/2021   Left below-knee amputee 07/01/2021   Normocytic anemia 06/26/2021   Heart failure with mid-range ejection fraction 06/26/2021   Diabetic nephropathy with proteinuria 06/26/2021   Dyspnea     Dental caries 04/01/2020   Cracked tooth 04/01/2020   Alcohol use 03/11/2020   Vitamin D deficiency 03/11/2020   Prolonged QT interval 02/26/2020   Chronic combined systolic and diastolic CHF (congestive heart failure) 02/26/2020   Heart block AV complete 02/11/2020   Exocrine pancreatic insufficiency    Dysmenorrhea 10/02/2019   DM type 2 with diabetic peripheral neuropathy 05/30/2019   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 block) 05/29/2019   Former smoker 11/26/2013   Hypercholesteremia 02/02/2007   Bipolar 1 disorder (HCC) 02/02/2007   HYPERTENSION, BENIGN SYSTEMIC 02/02/2007   Past Medical History:  Diagnosis Date   Abnormal Pap smear 1998   Abnormal vaginal bleeding 12/19/2011   Acute urinary retention 06/26/2021   Arthritis    Bacterial infection    Bipolar 1 disorder    Blister of second toe of left foot 11/21/2016   Candida vaginitis 07/2007   Depression    recently added wellbutrin-has not taken yet for bipolar   Diabetes in pregnancy    Diabetes mellitus    nph 20U qam and qpm, regular with meals   Diabetic ketoacidosis without coma associated with type 2 diabetes mellitus    Fibroid    Galactorrhea of right breast 2008   GERD (gastroesophageal reflux disease)    H/O amenorrhea 07/2007   H/O dizziness 10/14/2011   H/O dysmenorrhea 2010   H/O menorrhagia 10/14/2011   H/O varicella    Headache(784.0)    Heavy vaginal bleeding due to contraceptive injection use 10/12/2011   Depo Provera   Herpes    Homelessness 11/19/2021   HSV-2 infection 01/03/2009   Hx: UTI (urinary tract infection) 2009   Hypertension    on aldomet   Hypoalbuminemia due to protein-calorie malnutrition 06/26/2021   Increased BMI 2010   Irregular uterine bleeding 04/04/2012   Pt has mirena    Obesity 10/14/2011   Oligomenorrhea 07/2007   Osteomyelitis of great toe of right foot 03/22/2022   Pelvic pain in female    Presence of permanent cardiac pacemaker     Preterm labor    Trichomonas    Yeast infection     Family History  Problem Relation Age of Onset   Hypertension Mother    Diabetes Mother    Heart disease Mother    Hypertension Father    Diabetes Father    Heart disease Father    Stroke Maternal Grandfather    Other Neg Hx    Breast cancer Neg Hx     Past Surgical History:  Procedure Laterality Date   ABDOMINAL AORTOGRAM W/LOWER EXTREMITY N/A  06/17/2021   Procedure: ABDOMINAL AORTOGRAM W/LOWER EXTREMITY;  Surgeon: Iran Ouch, MD;  Location: [facility];  Service: Cardiovascular;  Laterality: N/A;   AMPUTATION Left 06/27/2021   Procedure: AMPUTATION BELOW KNEE;  Surgeon: Nadara Mustard, MD;  Location: [facility] OR;  Service: Orthopedics;  Laterality: Left;   AMPUTATION Right 09/04/2021   Procedure: RIGHT GREAT TOE AMPUTATION;  Surgeon: Nadara Mustard, MD;  Location: [facility] OR;  Service: Orthopedics;  Laterality: Right;   CESAREAN SECTION  1991   LEFT HEART CATH AND CORONARY ANGIOGRAPHY N/A 02/10/2020   Procedure: LEFT HEART CATH AND CORONARY ANGIOGRAPHY;  Surgeon: Lennette Bihari, MD;  Location: [facility];  Service: Cardiovascular;  Laterality: N/A;   PACEMAKER IMPLANT N/A 02/13/2020   Procedure: PACEMAKER IMPLANT;  Surgeon: Regan Lemming, MD;  Location: [facility];  Service: Cardiovascular;  Laterality: N/A;   TEMPORARY PACEMAKER N/A 02/10/2020   Procedure: TEMPORARY PACEMAKER;  Surgeon: Lennette Bihari, MD;  Location: [facility] CV LAB;  Service: Cardiovascular;  Laterality: N/A;   Social History   Occupational History   Not on file  Tobacco Use   Smoking status: Every Day    Packs/day: 0.50    Years: 20.00    Additional pack years: 0.00    Total pack years: 10.00    Types: Cigarettes    Last attempt to quit: 07/01/2021    Years since quitting: 1.7   Smokeless tobacco: Never  Vaping Use   Vaping Use: Never used  Substance and Sexual Activity   Alcohol use: No   Drug use: No   Sexual  activity: Yes    Birth control/protection: None

## 2023-04-05 ENCOUNTER — Encounter: Payer: Medicaid Other | Attending: Internal Medicine | Admitting: Nutrition

## 2023-04-05 ENCOUNTER — Ambulatory Visit: Payer: Medicaid Other | Attending: Family Medicine | Admitting: Pharmacist

## 2023-04-05 DIAGNOSIS — Z794 Long term (current) use of insulin: Secondary | ICD-10-CM

## 2023-04-05 DIAGNOSIS — E1165 Type 2 diabetes mellitus with hyperglycemia: Secondary | ICD-10-CM

## 2023-04-05 NOTE — Progress Notes (Signed)
Patient is here to go back on her OmniPod5.  She has a new phone which does not support the G6 app.  Her old phone does.  She was told that we can still use her old phone to link the Dexcom sensor to the pod.  She will come in tomorrow with her old phone.

## 2023-04-05 NOTE — Progress Notes (Signed)
S:     No chief complaint on file.  52 y.o. female who presents for diabetes evaluation, education, and management.  PMH is significant for HTN, CHF, T2DM, hypercholestermia.  Patient was referred and last seen by Primary Care Provider, Dr. Delford Field, on 03/15/2023.  At that visit, patient reported not taking any of their medications due to access and no medication changes were made.  Today, patient arrives in good spirits and presents without any assistance. Patient reports she has been unable to get her Dexcom to connect to her phone or her Omnipod. She has not used Dexcom or Omnipod in a couple of months. She does report she has re-started all of her oral medications. Patient reports she does have Dexcom and Omnipod at home.   Patient reports Diabetes longstanding.  Current diabetes medications include: Lantus 70 units daily, Novolog 14 units with meals, Farxiga 10 mg daily Current hypertension medications include: carvedilol 12.5 mg BID, isosorbide mononitrate 20 mg daily, losartan 100 mg daily, spironolactone 25 mg daily Current hyperlipidemia medications include: atorvastatin 20 mg daily  Patient reports adherence to taking all medications as prescribed.   Insurance coverage: [place] [employer]  Patient denies hypoglycemic events.  Reported home fasting blood sugars: not testing   Patient reports nocturia (nighttime urination).  Patient reports neuropathy (nerve pain). Patient denies visual changes. Patient reports self foot exams.   Patient reported dietary habits: eats when she can  Patient-reported exercise habits: moving around the house and work  O:    CGM Download: Not currently using Dexcom  Lab Results  Component Value Date   HGBA1C 8.8 (A) 12/15/2022   There were no vitals filed for this visit.  Lipid Panel     Component Value Date/Time   CHOL 158 01/13/2023 1045   TRIG 95 01/13/2023 1045   HDL 52 01/13/2023 1045   CHOLHDL 3.0 01/13/2023 1045   CHOLHDL 2.9  04/21/2022 1442   VLDL 28 04/21/2022 1442   LDLCALC 88 01/13/2023 1045    Clinical Atherosclerotic Cardiovascular Disease (ASCVD): No  The 10-year ASCVD risk score (Arnett DK, et al., 2019) is: 31.5%   Values used to calculate the score:     Age: 4 years     Sex: Female     Is Non-Hispanic African American: Yes     Diabetic: Yes     Tobacco smoker: Yes     Systolic Blood Pressure: 163 mmHg     Is BP treated: Yes     HDL Cholesterol: 52 mg/dL     Total Cholesterol: 158 mg/dL   A/P: Diabetes longstanding currently uncontrolled. Patient is able to verbalize appropriate hypoglycemia management plan. Medication adherence appears suboptimal due to access issues. Control is suboptimal due to access issues. -Continued basal insulin Lantus (insulin glargine) 70 units daily. -Continued rapid insulin Novolog (insulin aspart) 14 units with meals.  -Continued SGLT2-I Farxiga (dapagliflozin) 10 mg. Counseled on sick day rules. -Discussed reaching out to endocrinology for assistance setting up Dexcom and Omnipod. Patient has a nurse appointment this afternoon.  -Extensively discussed pathophysiology of diabetes, recommended lifestyle interventions, dietary effects on blood sugar control.  -Counseled on s/sx of and management of hypoglycemia.  -Next A1c anticipated today.   ASCVD risk - primary prevention in patient with diabetes. Last LDL is 88 not at goal of <70 mg/dL. 10-year ASCVD risk score of 31.5%. high intensity statin indicated.  -Continued atorvastatin 20 mg.  -F/u labs - lipid panel, BMP  Written patient instructions provided. Patient  verbalized understanding of treatment plan.  Total time in face to face counseling 30 minutes.    Follow-up:  Endocrinology nurse visit 04/05/2023 PCP clinic visit on 04/27/2023.   Georga Hacking, Pharm.D PGY1 Pharmacy Resident

## 2023-04-06 ENCOUNTER — Telehealth: Payer: Self-pay

## 2023-04-06 ENCOUNTER — Telehealth (HOSPITAL_COMMUNITY): Payer: Self-pay

## 2023-04-06 ENCOUNTER — Encounter: Payer: Medicaid Other | Attending: Internal Medicine | Admitting: Nutrition

## 2023-04-06 LAB — CMP14+EGFR
ALT: 10 IU/L (ref 0–32)
AST: 11 IU/L (ref 0–40)
Albumin/Globulin Ratio: 1.2 (ref 1.2–2.2)
Albumin: 3.4 g/dL — ABNORMAL LOW (ref 3.9–4.9)
Alkaline Phosphatase: 79 IU/L (ref 44–121)
BUN/Creatinine Ratio: 18 (ref 9–23)
BUN: 23 mg/dL (ref 6–24)
Bilirubin Total: <0.2 mg/dL (ref 0.0–1.2)
CO2: 16 mmol/L — ABNORMAL LOW (ref 20–29)
Calcium: 9 mg/dL (ref 8.7–10.2)
Chloride: 104 mmol/L (ref 96–106)
Creatinine, Ser: 1.3 mg/dL — ABNORMAL HIGH (ref 0.57–1.00)
Globulin, Total: 2.8 g/dL (ref 1.5–4.5)
Glucose: 209 mg/dL — ABNORMAL HIGH (ref 70–99)
Potassium: 5 mmol/L (ref 3.5–5.2)
Sodium: 136 mmol/L (ref 134–144)
Total Protein: 6.2 g/dL (ref 6.0–8.5)
eGFR: 50 mL/min/{1.73_m2} — ABNORMAL LOW (ref >59)

## 2023-04-06 LAB — LIPID PANEL
Chol/HDL Ratio: 2.8 ratio (ref 0.0–4.4)
Cholesterol, Total: 148 mg/dL (ref 100–199)
HDL: 52 mg/dL (ref >39)
LDL Chol Calc (NIH): 73 mg/dL (ref 0–99)
Triglycerides: 130 mg/dL (ref 0–149)
VLDL Cholesterol Cal: 23 mg/dL (ref 5–40)

## 2023-04-06 LAB — HEMOGLOBIN A1C
Est. average glucose Bld gHb Est-mCnc: 266 mg/dL
Hgb A1c MFr Bld: 10.9 % — ABNORMAL HIGH (ref 4.8–5.6)

## 2023-04-06 NOTE — Telephone Encounter (Signed)
Medical clearance faxed to [facility] on 04/06/23 [phone]

## 2023-04-06 NOTE — Telephone Encounter (Signed)
Pt was called and is aware of results, DOB was confirmed.  ?

## 2023-04-06 NOTE — Telephone Encounter (Signed)
-----   Message from Roney Jaffe, [place] sent at 04/06/2023 11:13 AM EDT ----- Please call patient and let her know that her A1c did increase from 8.8 3 months ago to 10.9.  It is very important that she remain compliant to regimen, check blood glucose levels on a daily basis, keep a written log and have available for all office visits.  Her kidney function has decreased slightly.  This should improve as her blood glucose levels improve.  She should have this rechecked during her next office visit with Dr. Delford Field on May 22.  Cholesterol is well-controlled with current regimen

## 2023-04-07 NOTE — Progress Notes (Signed)
Patient is here today to restart her OmniPod 5 insulin pump.  She had gotten a new phone which did not support the Dexcom G6 mobil app.  She will use her old phone whenever she starts a new sensors, and use it until the blood sugar readings will show up on the PDM.   Settings were put into new PDM that was sent to her while she was off the pump.  Last pump settings from office note on 12/15/22:  Basal rate: 2.0u/hr.  I/C: 1 (pt. Is not counting carbs), ISF: 25, target 110 with corrections over 110, timing: 4 hours, max bolus: 25, and max basal: 4.0u/hr.   We reviewed the need to bolus for all meals and all snacks-something she had not been doing.  Also when and how to do correction boluses.  She repeated X3 for me: "change pod every 3 days,  bolus for all meals and snacks eaten, and do correction boluses for all blood sugars over 225.

## 2023-04-07 NOTE — Patient Instructions (Addendum)
Change pod every 3 days Do correction doses whenever blood sugars go over 225 Bolus for all meals and snacks eaten Call OmniPod help line if questions or problems with pump

## 2023-04-13 ENCOUNTER — Telehealth (HOSPITAL_COMMUNITY): Payer: Self-pay | Admitting: Licensed Clinical Social Worker

## 2023-04-13 NOTE — Telephone Encounter (Signed)
H&V Care Navigation CSW Progress Note  Clinical Social Worker informed by patient that she has food insecurity.  Pt very savvy and accessing food pantries but wants more fresh food/meat options.  CSW provided with [facility] [employer] [facility]erral, [place] fresh food pick up days, and other pantries/hot meal referrals.  Patient is participating in a Managed Medicaid Plan:  Yes  SDOH Screenings   Food Insecurity: Food Insecurity Present (04/01/2023)  Housing: High Risk (04/01/2023)  Transportation Needs: Unmet Transportation Needs (04/01/2023)  Utilities: Not At Risk (04/05/2023)  Alcohol Screen: Low Risk  (04/01/2023)  Depression (PHQ2-9): Medium Risk (03/15/2023)  Financial Resource Strain: High Risk (04/01/2023)  Physical Activity: Insufficiently Active (04/01/2023)  Social Connections: Moderately Isolated (04/01/2023)  Stress: Stress Concern Present (04/01/2023)  Tobacco Use: High Risk (04/01/2023)    Burna Sis, LCSW Clinical Social Worker [facility] Desk#: [phone] Cell#: [phone]

## 2023-04-19 ENCOUNTER — Telehealth: Payer: Self-pay | Admitting: Orthopedic Surgery

## 2023-04-19 NOTE — Telephone Encounter (Signed)
Patient called in requesting prosthetic supplies shrinkers and socks and liners please fax to [facility] clinic

## 2023-04-19 NOTE — Progress Notes (Addendum)
PCP: [name], MD Endocrinology: Dr. Lonzo Cloud EP: Dr. Elberta Fortis Cardiology: Dr. Shirlee Latch  52 y.o. with history of type 2 diabetes, HTN, hyperlipidemia, complete heart block with PPM, and chronic diastolic CHF was referred by Dr. Delford Field for evaluation of CHF.  Patient has had diabetes since age 38 and has had HTN for at least 5 years.  Sarah Long is a smoker and has a history of prior ETOH abuse with ETOH pancreatitis. In 3/21, Sarah Long was admitted with DKA.  Sarah Long was found to have associated complete heart block that did not resolve, Sarah Long had St Jude PPM placed.  Sarah Long had an echocardiogram showing EF 40-45% with severe LV hypertrophy.  Sarah Long did not have a cardiac MRI prior to PPM placement.  Cath in 3/21 showed no significant CAD (nonischemic cardiomyopathy).   Last seen in 5/21, doing well but not taking any cardiac meds. Entresto started and started work up for cardiac sarcoidosis and cardiac amyloidosis.  Echo (12/22): EF 70-75%, severe LVH, normal RV, mild to moderate MR.  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 Sarah Long has a functioning orthotic.   Sarah Long had follow up with EP 1/23, PPM functioning appropriately. Daily lasix restarted and cMRI arranged.  cMRI (2/23) with LVEF 68%, severe LVH, ECV 54%, no LVOT gradient, poor nulling with uninterpretable delayed enhancement.   Follow up 5/23, NYHA II symptoms, volume overloaded. Lasix increase to 40 daily and genetic testing arranged to evaluate for TTR amyloidosis and hypertrophic cardiomyopathies. 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  Of note, "heart problems" run in family with mother and maternal grandparents having CHF.  Alpha galactosidase activity was low at 20.1.  Sarah Long was seen for genetics counseling by Dr. 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   Follow up 12/23, NYHA II-III symptoms and volume stable. Referred to Fabry's provider at [facility] to begin treatment with Fabrazyme. Labs  showed elevated SCr of 1.6 and K of 6.5. Sarah Long was instructed to hold spiro, losartan, stop KCL and go to the ED. Sarah Long was given a dose of Lokelma and repeat labs arranged.  Echo 2/24 EF 65-70%, severe LVH, normal RV size and systolic function, GLS -6.4% (very abnormal), IVC normal.   Today Sarah Long returns for HF follow up. Overall feeling fine. Sarah Long has "good days and bad days". Sarah Long is dizzy bending over. Has new orthopnea. Sarah Long has dyspnea if Sarah Long rushes, works as a Psychologist, clinical. Sarah Long has right clavicular ache, Sarah Long attributes this to her shoulder arthritis. Denies palpitations, CP, edema, or PND.  Appetite ok. No fever or chills. Weight at home 210-211 lbs. Taking all medications. Smoking 2-3 cigarettes/day. Sarah Long has Medicaid. Sarah Long has new urinary urgency, had vaginal itching and burning, partial symptom improvement with OTC monistat. BP at home 138/70.  ECG (personally reviewed): none ordered today.  Device interrogation (personally reviewed): unable to interrogate device in clinic today.  Labs (4/21): K 4, creatinine 0.87 Labs (2/23): K 4.4, creatinine 1.12 Labs (5/23): K 5.2, creatinine 1.14, LDL 53, HDL 42 Labs (8/23): K 4.4, creatinine 1.48 Labs (10/23): K 5.1, creatinine 1.34 Labs (12/23): K 5.1, creatinine 1.06 Labs (2/24): LDL 88, K 4.2, creatinine 1.61 Labs (4/24): K 5.0, creatinine 1.30, LDL 73  PMH: 1. Type 2 diabetes: Diagnosed at age 67.  2. HTN 3. Hyperlipidemia 4. Active smoker 5. H/o ETOH pancreatitis 6. Bipolar disorder 7. Complete heart block: 3/21, associate with DKA episode.  Has St Jude PPM.  8. Chronic systolic CHF: Nonischemic cardiomyopathy.  Echo (3/21) with EF 40-45%, severe LVH, mild MR, normal RV.  - LHC (3/21) normal coronaries.  - PYP scan (6/21): Negative for TTR cardiac amyloidosis.  - cMRI (2/23): LVEF 68%, severe LVH, ECV 54%, no LVOT gradient, poor nulling with uninterpretable delayed enhancement.  - CTA chest (7/23): No evidence for pulmonary sarcoidosis, no  ILD.  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  - Alpha-galactosidase activity testing low (20.1) - Echo (2/24): EF 65-70%, severe LVH, normal RV size and systolic function, GLS -6.4% (very abnormal), IVC normal.  9. S/p left BKA (7/22). Has Phantom Limb pain. 10. CKD stage 3: Diabetic nephropathy, ?related to Fabry disease.   SH: Divorced, un-employed, prior heavy ETOH now rare, smokes 5 cigarettes daily.  FH: Grandfather with CHF. No known sarcoidosis or amyloidosis.   ROS: All systems reviewed and negative except as per HPI.   Current Outpatient Medications  Medication Sig Dispense Refill   acetaminophen (TYLENOL) 325 MG tablet Take 650 mg by mouth every 6 (six) hours as needed (pain).     atorvastatin (LIPITOR) 20 MG tablet Take 1 tablet (20 mg total) by mouth daily. 90 tablet 3   Blood Pressure Monitoring (BLOOD PRESSURE MONITOR 7) DEVI Measure blood pressure daily 1 each 0   carvedilol (COREG) 12.5 MG tablet Take 1 tablet (12.5 mg total) by mouth 2 (two) times daily with a meal. 120 tablet 1   dapagliflozin propanediol (FARXIGA) 10 MG TABS tablet Take 1 tablet (10 mg total) by mouth daily before breakfast. 30 tablet 11   furosemide (LASIX) 20 MG tablet Patient takes 2 tablets by mouth daily.     hydrALAZINE (APRESOLINE) 25 MG tablet Take 1 tablet (25 mg total) by mouth 3 (three) times daily. 270 tablet 3   Insulin Disposable Pump (OMNIPOD 5 G6 POD, GEN 5,) MISC 1 Device by Does not apply route every other day. 9 each 3   insulin glargine (LANTUS SOLOSTAR) 100 UNIT/ML Solostar Pen Inject 60 Units into the skin daily. (Patient taking differently: Inject 60 Units into the skin daily. As needed) 60 mL 3   insulin lispro (HUMALOG) 100 UNIT/ML injection Max daily 125 units a day per pump 40 mL 11   Insulin Pen Needle 31G X 5 MM MISC Use as directed in the morning, at noon, in the evening, and at bedtime. 400 each 3   isosorbide mononitrate  (ISMO) 20 MG tablet Take 1 tablet (20 mg total) by mouth daily. 90 tablet 3   latanoprost (XALATAN) 0.005 % ophthalmic solution Place 1 drop into both eyes at bedtime.     losartan (COZAAR) 100 MG tablet Take 1 tablet (100 mg total) by mouth daily. 90 tablet 2   spironolactone (ALDACTONE) 25 MG tablet Take 1 tablet (25 mg total) by mouth daily. 90 tablet 3   varenicline (CHANTIX CONTINUING MONTH PAK) 1 MG tablet Take 1 tablet (1 mg total) by mouth 2 (two) times daily. 60 tablet 1   Continuous Blood Gluc Sensor (DEXCOM G6 SENSOR) MISC Use to check blood sugar three times daily. Change sensors once every 10 days. Dx code: E11.42 (Patient not taking: Reported on 04/26/2023) 3 each 3   Continuous Blood Gluc Transmit (DEXCOM G6 TRANSMITTER) MISC Use to check blood sugar three times daily. Change once every 90 days. Dx code: E11.42 (Patient not taking: Reported on 04/26/2023) 1 each 3   No current facility-administered medications for this encounter.  Wt Readings from Last 3 Encounters:  04/26/23 96.6 kg (213 lb)  03/15/23 95.8 kg (211 lb 3.2 oz)  03/14/23 98 kg (216 lb)   BP (!) 188/88   Pulse 87   Wt 96.6 kg (213 lb)   SpO2 100%   BMI 36.56 kg/m  Physical Exam General:  NAD. No resp difficulty, walked into clinic HEENT: Normal Neck: Supple. No JVD. Carotids 2+ bilat; no bruits. No lymphadenopathy or thryomegaly appreciated. Cor: PMI nondisplaced. Regular rate & rhythm. No rubs, gallops or murmurs. Lungs: Clear Abdomen: Soft, nontender, nondistended. No hepatosplenomegaly. No bruits or masses. Good bowel sounds. Extremities: No cyanosis, clubbing, rash, edema; s/p L BKA Neuro: Alert & oriented x 3, cranial nerves grossly intact. Moves all 4 extremities w/o difficulty. Tearful  Assessment/Plan: 1. Chronic systolic CHF: Echo in 3/21 with EF 40-45%, severe LV hypertrophy.  This was found in association with complete heart block.  Nonischemic cardiomyopathy, cath in 3/21 showed no significant  CAD.  PYP scan (6/21) not suggestive of TTR amyloidosis. cMRI (2/23) showed LVEF 68%, severe LVH, ECV 54%, no LVOT gradient, poor nulling with uninterpretable delayed enhancement. CTA chest did not show evidence for pulmonary sarcoidosis.  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  There is wide phenotypic variation in female Fabry's heterozygotes, but I suspect that the cardiomyopathy with LVH and h/o complete heart block in this situation is most likely due to Fabry's disease. Echo 2/24 showed EF 65-70%, severe LVH, normal RV size and systolic function, GLS -6.4% (very abnormal), IVC normal.  NYHA class II-III symptoms, not volume overloaded on exam.   - We will continue to work to get her an appt at Dr. Hilda Lias McDonald's office at [facility] for Fabry's evaluation. Sarah Long will need treatment with Fabrazyme (enzyme replacement).  - Continue hydralazine 25 mg tid + Imdur 20 mg daily.  - Continue Farxiga 10 mg daily. New GU symptoms, check U/A with UCx. - Continue Lasix 40 mg daily. - Continue spironolactone 25 mg daily.  - Continue Coreg 12.5 mg bid. - Continue losartan 100 mg daily. BMET today. 2. HTN: Elevated today, but generally well-controlled at home. No changes for now. 3. Hyperlipidemia: Lipids acceptable in 2/24.  4. Smoking: Smoking 2-3 cigs/day. Discussed cessation. - Continue Chantix. 5. Type 2 diabetes: Followed by Endocrinology. Sarah Long is on insulin. 6. Complete heart block: Sarah Long has a St Jude PPM and is pacing her RV > 99% of the time.  It is possible that this could be related to Fabry's disease.  7. Obesity: Body mass index is 36.56 kg/m. - Would avoid GLP-1 agonist with history of pancreatitis.  8. Palpitations: 7 day Zio monitor 2/24 showed rare PVCs (<1%)  9. GU: Check U/A with UCx. May need to stop [place].  10: Psych: Sarah Long carries a diagnosis of Bipolar II d/o.  tearful today, not currently on  medication, although has been in the past. Acknowledges utility of establishing with counselor and potentially restarting meds. - I encouraged her to discuss with PCP. Consider referral to Psych.   Follow up 3 months with Dr. Shirlee Latch.  Anderson Malta [facility] FNP-BC 04/26/2023

## 2023-04-20 NOTE — Telephone Encounter (Signed)
Faxed to [facility]

## 2023-04-26 ENCOUNTER — Ambulatory Visit (HOSPITAL_COMMUNITY)
Admission: RE | Admit: 2023-04-26 | Discharge: 2023-04-26 | Disposition: A | Discharge status: Home / Self Care | Payer: Medicare Other | Source: Ambulatory Visit | Attending: Family Medicine | Admitting: Family Medicine

## 2023-04-26 ENCOUNTER — Encounter (HOSPITAL_COMMUNITY): Payer: Self-pay

## 2023-04-26 ENCOUNTER — Other Ambulatory Visit (HOSPITAL_COMMUNITY): Payer: Self-pay

## 2023-04-26 ENCOUNTER — Telehealth (HOSPITAL_COMMUNITY): Payer: Self-pay

## 2023-04-26 VITALS — BP 188/88 | HR 87 | Wt 213.0 lb

## 2023-04-26 DIAGNOSIS — R002 Palpitations: Secondary | ICD-10-CM

## 2023-04-26 DIAGNOSIS — I428 Other cardiomyopathies: Secondary | ICD-10-CM | POA: Insufficient documentation

## 2023-04-26 DIAGNOSIS — Z7984 Long term (current) use of oral hypoglycemic drugs: Secondary | ICD-10-CM | POA: Insufficient documentation

## 2023-04-26 DIAGNOSIS — I5042 Chronic combined systolic (congestive) and diastolic (congestive) heart failure: Secondary | ICD-10-CM

## 2023-04-26 DIAGNOSIS — Z720 Tobacco use: Secondary | ICD-10-CM | POA: Diagnosis not present

## 2023-04-26 DIAGNOSIS — E7521 Fabry (-Anderson) disease: Secondary | ICD-10-CM

## 2023-04-26 DIAGNOSIS — F17210 Nicotine dependence, cigarettes, uncomplicated: Secondary | ICD-10-CM | POA: Diagnosis not present

## 2023-04-26 DIAGNOSIS — Z8742 Personal history of other diseases of the female genital tract: Secondary | ICD-10-CM | POA: Diagnosis not present

## 2023-04-26 DIAGNOSIS — I442 Atrioventricular block, complete: Secondary | ICD-10-CM

## 2023-04-26 DIAGNOSIS — Z89512 Acquired absence of left leg below knee: Secondary | ICD-10-CM | POA: Diagnosis not present

## 2023-04-26 DIAGNOSIS — E785 Hyperlipidemia, unspecified: Secondary | ICD-10-CM | POA: Diagnosis not present

## 2023-04-26 DIAGNOSIS — I130 Hypertensive heart and chronic kidney disease with heart failure and stage 1 through stage 4 chronic kidney disease, or unspecified chronic kidney disease: Secondary | ICD-10-CM | POA: Insufficient documentation

## 2023-04-26 DIAGNOSIS — I5022 Chronic systolic (congestive) heart failure: Secondary | ICD-10-CM | POA: Diagnosis not present

## 2023-04-26 DIAGNOSIS — Z79899 Other long term (current) drug therapy: Secondary | ICD-10-CM | POA: Insufficient documentation

## 2023-04-26 DIAGNOSIS — E1122 Type 2 diabetes mellitus with diabetic chronic kidney disease: Secondary | ICD-10-CM | POA: Insufficient documentation

## 2023-04-26 DIAGNOSIS — Z794 Long term (current) use of insulin: Secondary | ICD-10-CM | POA: Insufficient documentation

## 2023-04-26 DIAGNOSIS — E669 Obesity, unspecified: Secondary | ICD-10-CM | POA: Insufficient documentation

## 2023-04-26 DIAGNOSIS — Z8249 Family history of ischemic heart disease and other diseases of the circulatory system: Secondary | ICD-10-CM | POA: Insufficient documentation

## 2023-04-26 DIAGNOSIS — Z950 Presence of cardiac pacemaker: Secondary | ICD-10-CM | POA: Diagnosis not present

## 2023-04-26 DIAGNOSIS — R300 Dysuria: Secondary | ICD-10-CM

## 2023-04-26 DIAGNOSIS — I10 Essential (primary) hypertension: Secondary | ICD-10-CM | POA: Diagnosis not present

## 2023-04-26 DIAGNOSIS — I493 Ventricular premature depolarization: Secondary | ICD-10-CM | POA: Insufficient documentation

## 2023-04-26 DIAGNOSIS — E1169 Type 2 diabetes mellitus with other specified complication: Secondary | ICD-10-CM

## 2023-04-26 DIAGNOSIS — Z6836 Body mass index (BMI) 36.0-36.9, adult: Secondary | ICD-10-CM | POA: Insufficient documentation

## 2023-04-26 LAB — URINALYSIS, ROUTINE W REFLEX MICROSCOPIC
Bacteria, UA: NONE SEEN
Bilirubin Urine: NEGATIVE
Glucose, UA: 150 mg/dL — AB
Ketones, ur: NEGATIVE mg/dL
Leukocytes,Ua: NEGATIVE
Nitrite: NEGATIVE
Protein, ur: >=300 mg/dL — AB
Specific Gravity, Urine: 1.013 (ref 1.005–1.030)
pH: 5 (ref 5.0–8.0)

## 2023-04-26 LAB — BASIC METABOLIC PANEL
Anion gap: 8 (ref 5–15)
BUN: 23 mg/dL — ABNORMAL HIGH (ref 6–20)
CO2: 19 mmol/L — ABNORMAL LOW (ref 22–32)
Calcium: 8.9 mg/dL (ref 8.9–10.3)
Chloride: 109 mmol/L (ref 98–111)
Creatinine, Ser: 1.29 mg/dL — ABNORMAL HIGH (ref 0.44–1.00)
GFR, Estimated: 51 mL/min — ABNORMAL LOW (ref >60)
Glucose, Bld: 222 mg/dL — ABNORMAL HIGH (ref 70–99)
Potassium: 5.6 mmol/L — ABNORMAL HIGH (ref 3.5–5.1)
Sodium: 136 mmol/L (ref 135–145)

## 2023-04-26 NOTE — Telephone Encounter (Signed)
Pt aware, agreeable, and verbalized understanding. Appointment for blood work has been scheduled.

## 2023-04-26 NOTE — Telephone Encounter (Signed)
-----   Message from Jacklynn Ganong, [place] sent at 04/26/2023 12:49 PM EDT ----- No UTI, but there is elevated protein and hgb in urine that needs further work  up. Please follow up with PCP,  K is elevated. Stop any KCL supplements if taking (including vitamins).  Stop spiro and losartan. Repeat BMET on Thursday.

## 2023-04-26 NOTE — Patient Instructions (Addendum)
It was great to see you today! No medication changes are needed at this time.  Labs today We will only contact you if something comes back abnormal or we need to make some changes. Otherwise no news is good news!  You have been referred to [facility] with Dr Lilian Kapur Address:[address] [place], [place] Phone: [phone] -they will be in contact with an appointment  Your physician recommends that you schedule a follow-up appointment in: 3 months with Dr Shirlee Latch   Do the following things EVERYDAY: Weigh yourself in the morning before breakfast. Write it down and keep it in a log. Take your medicines as prescribed Eat low salt foods--Limit salt (sodium) to 2000 mg per day.  Stay as active as you can everyday Limit all fluids for the day to less than 2 liters At the [facility], you and your health needs are our priority. As part of our continuing mission to provide you with exceptional heart care, we have created designated Provider Care Teams. These Care Teams include your primary Cardiologist (physician) and Advanced Practice Providers (APPs- Physician Assistants and Nurse Practitioners) who all work together to provide you with the care you need, when you need it.   You may see any of the following providers on your designated Care Team at your next follow up: Dr Arvilla Meres Dr Marca Ancona Dr. Marcos Eke, NP Robbie Lis, [place] [facility] [place], [place] Brynda Peon, NP Karle Plumber, PharmD   Please be sure to bring in all your medications bottles to every appointment.    Thank you for choosing [facility]

## 2023-04-27 ENCOUNTER — Encounter: Payer: Self-pay | Admitting: Critical Care Medicine

## 2023-04-27 ENCOUNTER — Other Ambulatory Visit: Payer: Self-pay

## 2023-04-27 ENCOUNTER — Encounter (HOSPITAL_COMMUNITY): Payer: Self-pay | Admitting: Cardiology

## 2023-04-27 ENCOUNTER — Ambulatory Visit: Payer: Medicaid Other | Attending: Critical Care Medicine | Admitting: Critical Care Medicine

## 2023-04-27 ENCOUNTER — Other Ambulatory Visit (HOSPITAL_COMMUNITY)
Admission: RE | Admit: 2023-04-27 | Discharge: 2023-04-27 | Disposition: A | Discharge status: Home / Self Care | Payer: Medicare Other | Source: Ambulatory Visit | Attending: Critical Care Medicine | Admitting: Critical Care Medicine

## 2023-04-27 VITALS — BP 159/83 | HR 80 | Temp 98.5°F | Ht 64.0 in | Wt 213.0 lb

## 2023-04-27 DIAGNOSIS — Z6836 Body mass index (BMI) 36.0-36.9, adult: Secondary | ICD-10-CM | POA: Diagnosis not present

## 2023-04-27 DIAGNOSIS — E1142 Type 2 diabetes mellitus with diabetic polyneuropathy: Secondary | ICD-10-CM

## 2023-04-27 DIAGNOSIS — I10 Essential (primary) hypertension: Secondary | ICD-10-CM

## 2023-04-27 DIAGNOSIS — R809 Proteinuria, unspecified: Secondary | ICD-10-CM

## 2023-04-27 DIAGNOSIS — N898 Other specified noninflammatory disorders of vagina: Secondary | ICD-10-CM | POA: Insufficient documentation

## 2023-04-27 DIAGNOSIS — E7800 Pure hypercholesterolemia, unspecified: Secondary | ICD-10-CM

## 2023-04-27 DIAGNOSIS — I5022 Chronic systolic (congestive) heart failure: Secondary | ICD-10-CM

## 2023-04-27 DIAGNOSIS — E854 Organ-limited amyloidosis: Secondary | ICD-10-CM

## 2023-04-27 DIAGNOSIS — F319 Bipolar disorder, unspecified: Secondary | ICD-10-CM

## 2023-04-27 DIAGNOSIS — E1129 Type 2 diabetes mellitus with other diabetic kidney complication: Secondary | ICD-10-CM

## 2023-04-27 DIAGNOSIS — I442 Atrioventricular block, complete: Secondary | ICD-10-CM

## 2023-04-27 DIAGNOSIS — Z794 Long term (current) use of insulin: Secondary | ICD-10-CM | POA: Diagnosis not present

## 2023-04-27 DIAGNOSIS — I5020 Unspecified systolic (congestive) heart failure: Secondary | ICD-10-CM

## 2023-04-27 DIAGNOSIS — E66813 Obesity, class 3: Secondary | ICD-10-CM

## 2023-04-27 MED ORDER — DEXCOM G6 TRANSMITTER MISC
3 refills | Status: DC
  Filled 2023-04-27: qty 1, fill #0
  Filled 2023-04-28: qty 1, 84d supply, fill #0

## 2023-04-27 MED ORDER — LOSARTAN POTASSIUM 100 MG PO TABS
ORAL_TABLET | ORAL | 2 refills | Status: DC
  Filled 2023-04-27: qty 90, fill #0

## 2023-04-27 MED ORDER — SPIRONOLACTONE 25 MG PO TABS
ORAL_TABLET | ORAL | 3 refills | Status: DC
  Filled 2023-04-27: qty 90, fill #0

## 2023-04-27 NOTE — Assessment & Plan Note (Signed)
Blood pressure not well-controlled now she is off losartan and Aldactone suspect the blood pressure will go up further will query cardiology what the next step is she is on the carvedilol 12 and half milligram twice daily and the hydralazine 25 mg 3 times daily isosorbide 20 mg daily

## 2023-04-27 NOTE — Assessment & Plan Note (Signed)
Lifestyle approach recommended

## 2023-04-27 NOTE — Assessment & Plan Note (Signed)
Stable at this time 

## 2023-04-27 NOTE — Progress Notes (Signed)
Outgoing referral for [facility] Dr Austin Miles sent via fax Fax #   [phone] Confirmation received

## 2023-04-27 NOTE — Assessment & Plan Note (Signed)
Referral to behavioral health [facility]

## 2023-04-27 NOTE — Progress Notes (Signed)
Established Patient Office Visit  Subjective:  Patient ID: Sarah Long, female    DOB: 1971-05-08  Age: 52 y.o. MRN: [id number]  CC:  Chief Complaint  Patient presents with   Diabetes    DM f/u. Med refills. Concerns about high potassium - requesting to stop some meds G6 transmitter in unavailable & unable to track BS readings.  Requesting BH referral    HPI 11/2021 Sarah Long presents for primary care follow-up and diabetes management  Since last seen in October the patient has continued improvement.  She has been followed closely by endocrinology and her A1c is now down to 7.4.  It had been greater than 12.  The patient also now has a Dexcom glucometer and has showing data suggesting she is staying in the 100-150 range of her blood sugars.  The patient has had a left below-knee amputation now has a functional orthotic.  Patient is followed by rehab.  Patient's phantom limb pain is being managed by the rehab physician and she states is markedly better.  She has had 1 toe amputated on the right foot.  Recently developed a sore internally in her mouth.  This patient has poor dentition and needs to be seen by dentist.  Also she is living in a hotel and may be losing her housing soon and needs assistance in this regard.  Blood pressure is good on arrival 132/87  4/17 Patient seen in return follow-up unfortunately not been taking her oral medications because she cannot afford the co-pay.  She is taking her insulin products.  On arrival blood pressure is elevated 145/102.  She does have a Dexcom meter and recently saw endocrinology.  Below is documentation from that visit.  Endo 02/2022 Today (02/03/22): Sarah Long is here for a follow up on diabetes management. She checks her  blood sugars multiple times daily through CGM.  The patient has  had hypoglycemic episodes since the last clinic visit, which typically occur rarely and after a bolus.     She has made lifestyle changes with weight  loss  Has noted prosthetics    HOME DIABETES REGIMEN: Lantus 35 units daily - takes 45  Humalog 10 units TID QAC- 10-15 units  CF: Humalog (BG -130/25)      Statin: no ACE-I/ARB: no Prior Diabetic Education: yes  Glycemic patterns summary: BG's within goal at night, high during the day    Hyperglycemic episodes postprandial    Hypoglycemic episodes occurred post bolus   Overnight periods: trends down       DIABETIC COMPLICATIONS: Microvascular complications:  Left BKA, Right  great toe amputation , neuropathy  Denies: CKD, retinopathy  Last eye exam: Completed 01/2021   Macrovascular complications:  PAD Denies: CAD, CVA   1) Type 2 Diabetes Mellitus, Sub-Optimally controlled, With neuropathic complications, S/P Left BKA and Right great toe amputation  - Most recent A1c of 7.9 %. Goal A1c < 7.0 %.       - A1c remains above goal - She unfortunately has self adjusted her insulin regimen, it appears that she has not been using the correction scale, she does admit to guessing on prandial dose has occasional hypoglycemia -We discussed the importance of following instructions so that we make the appropriate changes were necessary -In review of her CGM, I am going to increase her basal as well as prandial dose, she will be provided with a new correction scale - She has a hx of Pancreatitis , not a candidate  for DPP 4 inhibitors and GLP-1 agonist - Has hx of DKA , would avoid SGLT2 inhibitors       MEDICATIONS: Increase Lantus to 50 units daily  Take Humalog 12 units TIDQAC Correction Factor: Humalog (BG -130/25)    EDUCATION / INSTRUCTIONS: BG monitoring instructions: Patient is instructed to check her blood sugars 3 times a day, before meals . Call [facility] if: BG persistently < 70  I reviewed the Rule of 15 for the treatment of hypoglycemia in detail with the patient. Literature supplied.     2) Diabetic complications:  Eye: Does not have known  diabetic retinopathy.  Neuro/ Feet: Does  have known diabetic peripheral neuropathy. Renal: Patient does not have known baseline CKD. She is not on an ACEI/ARB at present.     3) Dyslipidemia :     - She is not on atorvastatin, patient does not like taking medication and would rather use natural route if possible -We discussed the increased risk of cardiovascular disease as well as CVA with hyperlipidemia in the setting of diabetes   4) Microalbuminuria:       -She has losartan listed but she is not taking it.  We discussed the renal benefits with ARB and I have encouraged her to consider taking it   Medication Restart losartan 25 mg daily   Patient never restarted the losartan or atorvastatin because she cannot afford the $4 co-pay.  She is using insulin as prescribed.  She still living in a motel with her children but is having difficulty affording the rent.  9/5 Patient seen in return follow-up and is followed by cardiology closely for her cardiomyopathy nonischemic.  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  She is undergoing genetic testing for this.  She does have a pacemaker in place.  Cardiac medicines have been adjusted by cardiology.  Patient is also followed by endocrinology and she is on 70 units of Lantus and a sliding scale Humalog average dose of 14 units before meals.  She is trying to get set up for the insulin pump but is yet to achieve this.  She was seen by ophthalmology in April of this year and found to not have diabetic eye disease.  She is due a colon cancer screening and agrees to receive the Cologuard kit.  She needs dental care follow-up.  Also needs mental health follow-up.  She cannot take Latuda because of her cardiac disease and had itching with Lamictal.  She is due a flu vaccine and agrees to receive this is also due foot exam at this visit  01/13/23 Patient is seen in return follow-up has not been seen since last September.  Patient's had  difficulty with her insurance partially approving some of her diabetic supplies including her insulin pump and Dexcom 6.  She has been followed by endocrinology documentation of the last visit is as below.  She still has elevated A1c.  She is on long and short acting insulin at this time.  She is not a candidate for a GLP-1 because of pancreatitis in the past.  Patient does have constipation as well.  She was off cigarettes with use of Chantix she ran out of Chantix now she is back smoking.  Also has mild excess bleeding and cervical vaginal discharge she may yet need gynecology referral.  She also is having some dysuria.  She has a blood pressure meter at home but does not take the blood pressure.  Blood pressure  today on arrival is elevated 166/103. Patient is now out of the hotel living in a home.  Her children are with her.  Below is a recent endocrinology note 1) Type 2 Diabetes Mellitus, Poorly  controlled, With neuropathic complications, S/P Left BKA and Right great toe amputation  - Most recent A1c of 8.8 %. Goal A1c < 7.0 %.       - A1c remains above goal - She has a hx of Pancreatitis , not a candidate for DPP 4 inhibitors and GLP-1 agonist - Has hx of DKA , would avoid SGLT2 inhibitors -She has not been on the pump for the past 3 weeks due to insurance coverage, she is working with her current insurance and will be able to eventually stay on Medicaid and will obtain the OmniPod pump.  Patient liked the pump and would like to stay on it -Despite her elevated A1c, we have been able to download her pump through December, her average BG's at the time 142 mg/DL, within range BG's have improved from 26% to 74%.  And hyperglycemia has improved from 44% down to 20%. -Overall if she is able to continue on the pump this will be a better option, the patient was advised that if she has no Dexcom she should continue to check glucose before each meal and entered into the pump while in the manual mode -I  also emphasized the importance of being on basal insulin if she is not going to be on the pump for 24 hours or more -A refill of the Lantus was prescribed to use when she is of the pump, patient was advised to continue to use Humalog 14 units with each meal -Unable to make any changes to the pump settings as the patient did not bring her PDM     MEDICATIONS:   -Take Lantus 60 units daily while of OmniPod -Continue Humalog 14 units with each meal -Patient will return to the current pump settings once the insurance issues have been resolved   Below is recent cardiology note the patient appears to have a form of amyloidosis and is undergoing consideration for treatment at [facility] 1. Chronic systolic CHF: Echo in 3/21 with EF 40-45%, severe LV hypertrophy.  This was found in association with complete heart block.  Nonischemic cardiomyopathy, cath in 3/21 showed no significant CAD.  PYP scan (6/21) not suggestive of TTR amyloidosis. cMRI (2/23) showed LVEF 68%, severe LVH, ECV 54%, no LVOT gradient, poor nulling with uninterpretable delayed enhancement. CTA chest did not show evidence for pulmonary sarcoidosis.  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  There is wide phenotypic variation in female Fabry's heterozygotes, but I suspect that the cardiomyopathy with LVH and h/o complete heart block in this situation is most likely due to Fabry's disease. She is not volume overloaded on exam with NYHA class II-III symptoms.  - We will continue to work to get her an appt at Dr. Hilda Lias McDonald's office at [facility]. She will need treatment with Fabrazyme (enzyme replacement).  - Continue hydralazine 25 mg tid + Imdur 20 mg daily.  - Continue Lasix 40 qam/20 qpm. BMET today.  - Continue spironolactone 25 mg daily.  - Continue Coreg 6.25 mg bid. - Continue losartan 50 mg daily. - Echo at followup appt.  2. HTN: Mildly elevated  today, but generally well-controlled at home. No changes for now. 3. Hyperlipidemia: Good lipids 5/23.  4. H/o smoking: Quit x 1 month.    - Continue Chantix. 5. Type 2 diabetes: Followed by Endocrinology. She is on insulin. 6. Complete heart block: She has a [employer] Jude PPM and is pacing her RV > 99% of the time.  It is possible that this could be related to Fabry's disease.  7. Obesity: Body mass index is 36.39 kg/m. - Considered referral for GLP1RA, but she has history of pancreatitis. 8. CKD stage 3: Suspect diabetic nephropathy.  This may be related to Fabry's disease as well.  - BMET today.   03/15/23 she has been without medication for 1 month due to insurance barriers.  The patient is seen in return follow-up she now has achieved Medicaid and can get medications again unfortunately with this blood pressure is elevated as is blood sugar.  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  She has changes in the right foot and may need to orthopedics again.  She does have housing and is renting this.  She is no longer drinking alcohol.  04/27/23 Patient seen in return follow-up comes in a very depressed tearful state.  She needs behavioral health history of bipolar not on medications.  She stays exhausted.  She has been off bipolar meds for 5 years.  Blood pressure is elevated 159/83 she takes her blood pressure at home it is around 138/88.  Patient had been recently seen by cardiology labs showed elevated potassium she stopped her Aldactone and losartan from cardiology and today blood pressure remains high she also has a vaginal discharge and did have a urine culture with cardiology UA showed hematuria and increased protein and increased glucose.  Patient needs medication refills.  She has a G6 transmitter and this needs to be refilled we did message endocrinology they will take care of this.  We also message cardiology about what is the follow-up without Aldactone and  losartan  Orthopedic recently saw the patient and stated her right foot was improved Past Medical History:  Diagnosis Date   Abnormal Pap smear 1998   Abnormal vaginal bleeding 12/19/2011   Acute urinary retention 06/26/2021   Arthritis    Bacterial infection    Bipolar 1 disorder (HCC)    Blister of second toe of left foot 11/21/2016   Candida vaginitis 07/2007   Depression    recently added wellbutrin-has not taken yet for bipolar   Diabetes in pregnancy    Diabetes mellitus    nph 20U qam and qpm, regular with meals   Diabetic ketoacidosis without coma associated with type 2 diabetes mellitus (HCC)    Fibroid    Galactorrhea of right breast 2008   GERD (gastroesophageal reflux disease)    H/O amenorrhea 07/2007   H/O dizziness 10/14/2011   H/O dysmenorrhea 2010   H/O menorrhagia 10/14/2011   H/O varicella    Headache(784.0)    Heavy vaginal bleeding due to contraceptive injection use 10/12/2011   Depo Provera   Herpes    Homelessness 11/19/2021   HSV-2 infection 01/03/2009   Hx: UTI (urinary tract infection) 2009   Hypertension    on aldomet   Hypoalbuminemia due to protein-calorie malnutrition (HCC) 06/26/2021   Increased BMI 2010   Irregular uterine bleeding 04/04/2012   Pt has mirena    Obesity 10/14/2011   Oligomenorrhea 07/2007   Osteomyelitis of great toe of right foot (HCC) 03/22/2022   Pelvic pain in female    Presence of permanent cardiac pacemaker  Preterm labor    Trichomonas    Yeast infection     Past Surgical History:  Procedure Laterality Date   ABDOMINAL AORTOGRAM W/LOWER EXTREMITY N/A 06/17/2021   Procedure: ABDOMINAL AORTOGRAM W/LOWER EXTREMITY;  Surgeon: Iran Ouch, MD;  Location: [facility] INV[facility]IVE [facility] LAB;  Service: Cardiovascular;  Laterality: N/A;   AMPUTATION Left 06/27/2021   Procedure: AMPUTATION BELOW KNEE;  Surgeon: Nadara Mustard, MD;  Location[facility];  Service: Orthopedics;  Laterality: Left;   AMPUTATION Right 09/04/2021    Procedure: RIGHT GREAT TOE AMPUTATION;  Surgeon: Nadara Mustard, MD;  Location: [facility] OR;  Service: Orthopedics;  Laterality: Right;   CESAREAN SECTION  1991   LEFT HEART CATH AND CORONARY ANGIOGRAPHY N/A 02/10/2020   Procedure: LEFT HEART CATH AND CORONARY ANGIOGRAPHY;  Surgeon: Lennette Bihari, MD;  Location: [facility] INVASIVE CV LAB;  Service: Cardiovascular;  Laterality: N/A;   PACEMAKER IMPLANT N/A 02/13/2020   Procedure: PACEMAKER IMPLANT;  Surgeon: Regan Lemming, MD;  Location: [facility]VASIVE CV LAB;  Service: Cardiovascular;  Laterality: N/A;   TEMPORARY PACEMAKER N/A 02/10/2020   Procedure: TEMPORARY PACEMAKER;  Surgeon: Lennette Bihari, MD;  Location: [facility] INVASIVE CV LAB;  Service: Cardiovascular;  Laterality: N/A;    Family History  Problem Relation Age of Onset   Hypertension Mother    Diabetes Mother    Heart disease Mother    Hypertension Father    Diabetes Father    Heart disease Father    Stroke Maternal Grandfather    Other Neg Hx    Breast cancer Neg Hx     Social History   Socioeconomic History   Marital status: Significant Other    Spouse name: Not on file   Number of children: Not on file   Years of education: Not on file   Highest education level: Some college, no degree  Occupational History   Not on file  Tobacco Use   Smoking status: Every Day    Packs/day: 0.50    Years: 20.00    Additional pack years: 0.00    Total pack years: 10.00    Types: Cigarettes    Last attempt to quit: 07/01/2021    Years since quitting: 1.8   Smokeless tobacco: Never  Vaping Use   Vaping Use: Never used  Substance and Sexual Activity   Alcohol use: No   Drug use: No   Sexual activity: Yes    Birth control/protection: None  Other Topics Concern   Not on file  Social History Narrative   Not on file   Social Determinants of Health   Financial Resource Strain: High Risk (04/01/2023)   Overall Financial Resource Strain (CARDIA)    Difficulty of Paying Living Expenses: Very  hard  Food Insecurity: Food Insecurity Present (04/13/2023)   Hunger Vital Sign    Worried About Running Out of Food in the Last Year: Sometimes true    Ran Out of Food in the Last Year: Sometimes true  Transportation Needs: Unmet Transportation Needs (04/01/2023)   PRAPARE - Transportation    Lack of Transportation (Medical): Yes    Lack of Transportation (Non-Medical): Yes  Physical Activity: Insufficiently Active (04/01/2023)   Exercise Vital Sign    Days of Exercise per Week: 3 days    Minutes of Exercise per Session: 20 min  Stress: Stress Concern Present (04/01/2023)   [employer] of Occupational Health - Occupational Stress Questionnaire    Feeling of Stress : Very much  Social  Connections: Moderately Isolated (04/01/2023)   Social Connection and Isolation Panel [NHANES]    Frequency of Communication with Friends and Family: Once a week    Frequency of Social Gatherings with Friends and Family: Once a week    Attends Religious Services: More than 4 times per year    Active Member of [employer] or Organizations: Yes    Attends Engineer, structural: More than 4 times per year    Marital Status: Divorced  Intimate Partner Violence: Not At Risk (04/05/2023)   Humiliation, Afraid, Rape, and Kick questionnaire    Fear of Current or Ex-Partner: No    Emotionally Abused: No    Physically Abused: No    Sexually Abused: No    Outpatient Medications Prior to Visit  Medication Sig Dispense Refill   acetaminophen (TYLENOL) 325 MG tablet Take 650 mg by mouth every 6 (six) hours as needed (pain).     atorvastatin (LIPITOR) 20 MG tablet Take 1 tablet (20 mg total) by mouth daily. 90 tablet 3   Blood Pressure Monitoring (BLOOD PRESSURE MONITOR 7) DEVI Measure blood pressure daily 1 each 0   carvedilol (COREG) 12.5 MG tablet Take 1 tablet (12.5 mg total) by mouth 2 (two) times daily with a meal. 120 tablet 1   Continuous Blood Gluc Sensor (DEXCOM G6 SENSOR) MISC Use to check blood sugar  three times daily. Change sensors once every 10 days. Dx code: E11.42 3 each 3   dapagliflozin propanediol (FARXIGA) 10 MG TABS tablet Take 1 tablet (10 mg total) by mouth daily before breakfast. 30 tablet 11   furosemide (LASIX) 20 MG tablet Patient takes 2 tablets by mouth daily.     hydrALAZINE (APRESOLINE) 25 MG tablet Take 1 tablet (25 mg total) by mouth 3 (three) times daily. 270 tablet 3   Insulin Disposable Pump (OMNIPOD 5 G6 POD, GEN 5,) MISC 1 Device by Does not apply route every other day. 9 each 3   insulin glargine (LANTUS SOLOSTAR) 100 UNIT/ML Solostar Pen Inject 60 Units into the skin daily. (Patient taking differently: Inject 60 Units into the skin daily. As needed) 60 mL 3   insulin lispro (HUMALOG) 100 UNIT/ML injection Max daily 125 units a day per pump 40 mL 11   Insulin Pen Needle 31G X 5 MM MISC Use as directed in the morning, at noon, in the evening, and at bedtime. 400 each 3   isosorbide mononitrate (ISMO) 20 MG tablet Take 1 tablet (20 mg total) by mouth daily. 90 tablet 3   latanoprost (XALATAN) 0.005 % ophthalmic solution Place 1 drop into both eyes at bedtime.     varenicline (CHANTIX CONTINUING MONTH PAK) 1 MG tablet Take 1 tablet (1 mg total) by mouth 2 (two) times daily. 60 tablet 1   Continuous Blood Gluc Transmit (DEXCOM G6 TRANSMITTER) MISC Use to check blood sugar three times daily. Change once every 90 days. Dx code: E11.42 1 each 3   losartan (COZAAR) 100 MG tablet Take 1 tablet (100 mg total) by mouth daily. 90 tablet 2   spironolactone (ALDACTONE) 25 MG tablet Take 1 tablet (25 mg total) by mouth daily. 90 tablet 3   No facility-administered medications prior to visit.    Allergies  Allergen Reactions   Lamictal [Lamotrigine] Itching   Strawberry Extract Hives   Sulfa Antibiotics Hives and Itching    ROS Review of Systems  Constitutional:  Negative for chills, diaphoresis, fever and unexpected weight change.  HENT: Negative.  Negative  for  congestion, ear pain, hearing loss, nosebleeds, postnasal drip, rhinorrhea, sinus pressure, sore throat, tinnitus, trouble swallowing and voice change.   Eyes: Negative.  Negative for photophobia and redness.  Respiratory: Negative.  Negative for apnea, cough, choking, chest tightness, shortness of breath, wheezing and stridor.   Cardiovascular: Negative.  Negative for chest pain, palpitations and leg swelling.  Gastrointestinal: Negative.  Negative for abdominal distention, abdominal pain, blood in stool, constipation, diarrhea, nausea and vomiting.  Endocrine: Negative for polydipsia.  Genitourinary:  Negative for dysuria, flank pain, frequency, hematuria, urgency and vaginal discharge.  Musculoskeletal: Negative.  Negative for arthralgias, back pain, myalgias and neck pain.       Vascular changes right foot second toe  Skin: Negative.  Negative for rash.  Allergic/Immunologic: Negative.  Negative for environmental allergies and food allergies.  Neurological: Negative.  Negative for dizziness, tremors, seizures, syncope, weakness and headaches.  Hematological: Negative.  Negative for adenopathy. Does not bruise/bleed easily.  Psychiatric/Behavioral:  Positive for behavioral problems, decreased concentration and dysphoric mood. Negative for agitation, self-injury, sleep disturbance and suicidal ideas. The patient is nervous/anxious.       Objective:    Physical Exam Vitals reviewed.  Constitutional:      Appearance: Normal appearance. She is well-developed. She is obese. She is not diaphoretic.  HENT:     Head: Normocephalic and atraumatic.     Nose: No nasal deformity, septal deviation, mucosal edema or rhinorrhea.     Right Sinus: No maxillary sinus tenderness or frontal sinus tenderness.     Left Sinus: No maxillary sinus tenderness or frontal sinus tenderness.     Mouth/Throat:     Mouth: Mucous membranes are moist.     Pharynx: No oropharyngeal exudate.     Comments: There is a  slight ulceration inside the right cheek mucosa and there is a fractured molar which likely has caused the injury Eyes:     General: No scleral icterus.    Conjunctiva/sclera: Conjunctivae normal.     Pupils: Pupils are equal, round, and reactive to light.  Neck:     Thyroid: No thyromegaly.     Vascular: No carotid bruit or JVD.     Trachea: Trachea normal. No tracheal tenderness or tracheal deviation.  Cardiovascular:     Rate and Rhythm: Normal rate and regular rhythm.     Chest Wall: PMI is not displaced.     Pulses: Normal pulses. No decreased pulses.     Heart sounds: Normal heart sounds, S1 normal and S2 normal. Heart sounds not distant. No murmur heard.    No systolic murmur is present.     No diastolic murmur is present.     No friction rub. No gallop. No S3 or S4 sounds.  Pulmonary:     Effort: No tachypnea, accessory muscle usage or respiratory distress.     Breath sounds: No stridor. No decreased breath sounds, wheezing, rhonchi or rales.  Chest:     Chest wall: No tenderness.  Abdominal:     General: Bowel sounds are normal. There is no distension.     Palpations: Abdomen is soft. Abdomen is not rigid.     Tenderness: There is no abdominal tenderness. There is no guarding or rebound.  Musculoskeletal:        General: Normal range of motion.     Cervical back: Normal range of motion and neck supple. No edema, erythema or rigidity. No muscular tenderness. Normal range of motion.     Right  lower leg: No tenderness. No edema.     Comments: Orthotic on left below-knee amputation left leg functioning well Blackened second toe right foot stable  Lymphadenopathy:     Head:     Right side of head: No submental or submandibular adenopathy.     Left side of head: No submental or submandibular adenopathy.     Cervical: No cervical adenopathy.  Skin:    General: Skin is warm and dry.     Coloration: Skin is not pale.     Findings: No rash.     Nails: There is no clubbing.   Neurological:     General: No focal deficit present.     Mental Status: She is alert and oriented to person, place, and time.     Sensory: No sensory deficit.  Psychiatric:        Attention and Perception: Attention and perception normal.        Mood and Affect: Mood is anxious and depressed. Affect is tearful.        Speech: Speech normal.        Behavior: Behavior normal. Behavior is cooperative.        Thought Content: Thought content normal. Thought content does not include homicidal or suicidal ideation. Thought content does not include homicidal or suicidal plan.        Cognition and Memory: Cognition normal.        Judgment: Judgment normal.     BP (!) 159/83 (BP Location: Left Arm, Patient Position: Sitting, Cuff Size: Normal)   Pulse 80   Temp 98.5 F (36.9 C) (Oral)   Ht 5\' 4"  (1.626 m)   Wt 213 lb (96.6 kg)   SpO2 100%   BMI 36.56 kg/m  Wt Readings from Last 3 Encounters:  04/27/23 213 lb (96.6 kg)  04/26/23 213 lb (96.6 kg)  03/15/23 211 lb 3.2 oz (95.8 kg)     Health Maintenance Due  Topic Date Due   Fecal DNA (Cologuard)  Never done   OPHTHALMOLOGY EXAM  03/11/2023     There are no preventive care reminders to display for this patient.  Lab Results  Component Value Date   TSH 4.396 02/11/2020   Lab Results  Component Value Date   WBC 8.0 11/23/2022   HGB 10.9 (L) 11/23/2022   HCT 33.9 (L) 11/23/2022   MCV 91.1 11/23/2022   PLT 198 11/23/2022   Lab Results  Component Value Date   NA 136 04/26/2023   K 5.6 (H) 04/26/2023   CO2 19 (L) 04/26/2023   GLUCOSE 222 (H) 04/26/2023   BUN 23 (H) 04/26/2023   CREATININE 1.29 (H) 04/26/2023   BILITOT <0.2 04/05/2023   ALKPHOS 79 04/05/2023   AST 11 04/05/2023   ALT 10 04/05/2023   PROT 6.2 04/05/2023   ALBUMIN 3.4 (L) 04/05/2023   CALCIUM 8.9 04/26/2023   ANIONGAP 8 04/26/2023   EGFR 50 (L) 04/05/2023   Lab Results  Component Value Date   CHOL 148 04/05/2023   Lab Results  Component Value  Date   HDL 52 04/05/2023   Lab Results  Component Value Date   LDLCALC 73 04/05/2023   Lab Results  Component Value Date   TRIG 130 04/05/2023   Lab Results  Component Value Date   CHOLHDL 2.8 04/05/2023   Lab Results  Component Value Date   HGBA1C 10.9 (H) 04/05/2023      Assessment & Plan:   Problem List Items Addressed This Visit  Cardiovascular and Mediastinum   HYPERTENSION, BENIGN SYSTEMIC (Chronic)    Blood pressure not well-controlled now she is off losartan and Aldactone suspect the blood pressure will go up further will query cardiology what the next step is she is on the carvedilol 12 and half milligram twice daily and the hydralazine 25 mg 3 times daily isosorbide 20 mg daily      Heart block AV complete (HCC)    Stable at this time      Heart failure with mid-range ejection fraction [facility])    As per cardiology      Cardiac amyloidosis [facility])     Endocrine   DM type 2 with diabetic peripheral neuropathy (HCC)   Relevant Medications   Continuous Glucose Transmitter (DEXCOM G6 TRANSMITTER) MISC   Type 2 diabetes mellitus with microalbuminuria, with long-term current use of insulin (HCC) - Primary    Patient is on the OmniPod per endocrinology they are managing her diabetes        Other   Hypercholesteremia    Continue statin therapy with atorvastatin      Bipolar 1 disorder (HCC)    Referral to behavioral health [facility]      Vaginal discharge    Reassess with vaginal cervical swab follow-up urine culture      Relevant Orders   Cervicovaginal ancillary only   Class 2 severe obesity due to excess calories with serious comorbidity and body mass index (BMI) of 36.0 to 36.9 in adult [facility])    Lifestyle approach recommended     38 minutes spent complex decision making patient extremely upset follow-up: Return in about 2 months (around 06/27/2023) for htn, diabetes.   Shan Levans, MD

## 2023-04-27 NOTE — Assessment & Plan Note (Signed)
Patient is on the OmniPod per endocrinology they are managing her diabetes

## 2023-04-27 NOTE — Patient Instructions (Addendum)
[facility] [address], [place], [place] [address] [phone] or [phone] Walk-in urgent care 24/7 for anyone  For [facility] ONLY New patient assessments and therapy walk-ins: Monday and Wednesday 8am-11am First and second Friday 1pm-5pm New patient psychiatry and medication management walk-ins: Mondays, Wednesdays, Thursdays, Fridays 8am-11am No psychiatry walk-ins on Tuesday    [facility]  [address], [place], [place] [address] [phone] or [phone] WALK-IN URGENT CARE 24/7 FOR ANYONE [address], [place], [place]  [phone] Fax: [phone] [email or web address] *Interpreters available *Accepts all insurance and uninsured for Urgent Care needs *Accepts Medicaid and uninsured for outpatient treatment (below)   HOLD losartan and aldactone  Refill on dexcom 6 transmitter sent  Cervical swab today  Go to behavioral health today  Always take meds before MD visit  Return Dr Delford Field 2 months

## 2023-04-27 NOTE — Assessment & Plan Note (Signed)
As per cardiology

## 2023-04-27 NOTE — Assessment & Plan Note (Signed)
Continue statin therapy with atorvastatin

## 2023-04-27 NOTE — Assessment & Plan Note (Signed)
Reassess with vaginal cervical swab follow-up urine culture

## 2023-04-28 ENCOUNTER — Other Ambulatory Visit: Payer: Self-pay

## 2023-04-28 ENCOUNTER — Telehealth: Payer: Self-pay | Admitting: Internal Medicine

## 2023-04-28 LAB — CERVICOVAGINAL ANCILLARY ONLY
Bacterial Vaginitis (gardnerella): NEGATIVE
Candida Glabrata: NEGATIVE
Candida Vaginitis: NEGATIVE
Chlamydia: NEGATIVE
Comment: NEGATIVE
Comment: NEGATIVE
Comment: NEGATIVE
Comment: NEGATIVE
Comment: NEGATIVE
Comment: NORMAL
Neisseria Gonorrhea: NEGATIVE
Trichomonas: NEGATIVE

## 2023-04-28 NOTE — Progress Notes (Signed)
Let pt know no yeast in vagina

## 2023-04-28 NOTE — Telephone Encounter (Signed)
Patient came in to office today and picked up sample of Dexcom G6 Sensor that Cristy Folks, RN,CDE,CPT left for her.

## 2023-04-29 ENCOUNTER — Telehealth: Payer: Self-pay

## 2023-04-29 NOTE — Telephone Encounter (Signed)
-----   Message from [name], MD sent at 04/28/2023  4:18 PM EDT ----- Let pt know no yeast in vagina

## 2023-04-29 NOTE — Telephone Encounter (Signed)
Pt was called and is aware of results, DOB was confirmed.  ?

## 2023-05-03 ENCOUNTER — Telehealth (HOSPITAL_COMMUNITY): Payer: Self-pay | Admitting: Licensed Clinical Social Worker

## 2023-05-03 DIAGNOSIS — I5042 Chronic combined systolic (congestive) and diastolic (congestive) heart failure: Secondary | ICD-10-CM

## 2023-05-03 NOTE — Telephone Encounter (Signed)
CSW saw fax come through from [facility] that they have been unable to contact patient to set up appt.  CSW assisted in connecting pt and [facility] clinic and appt has been scheduled for July 11th at [facility] for virtual visit.  Burna Sis, LCSW Clinical Social Worker [facility] Desk#: [phone] Cell#: [phone]

## 2023-05-03 NOTE — Telephone Encounter (Signed)
H&V Care Navigation CSW Progress Note  Clinical Social Worker received call from pt with current concerns.  States that her car broke down over the weekend and now she has no way to get to upcoming appt or to get to work. CSW assisted in calling Medicaid and setting up transport to appt on Thurs.    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  CSW can provide some bus passes and CSW told pt to call Medicaid to inquire about possible assistance as they do have some support to get people to and from their job.  CSW consulted Managed Medicaid team to also assist with finding transport to work.  Pt also concerned about getting food- planning to order some and CSW will provide with food bags at up coming lab appt on Thursday.  Patient is participating in a Managed Medicaid Plan:  Yes  SDOH Screenings   Food Insecurity: Food Insecurity Present (05/03/2023)  Housing: High Risk (04/01/2023)  Transportation Needs: Unmet Transportation Needs (05/03/2023)  Utilities: Not At Risk (04/05/2023)  Alcohol Screen: Low Risk  (04/01/2023)  Depression (PHQ2-9): High Risk (04/27/2023)  Financial Resource Strain: High Risk (04/01/2023)  Physical Activity: Insufficiently Active (04/01/2023)  Social Connections: Moderately Isolated (04/01/2023)  Stress: Stress Concern Present (04/01/2023)  Tobacco Use: High Risk (04/27/2023)    Burna Sis, LCSW Clinical Social Worker [facility] Desk#: [phone] Cell#: [phone]

## 2023-05-05 ENCOUNTER — Ambulatory Visit (HOSPITAL_COMMUNITY)
Admission: RE | Admit: 2023-05-05 | Discharge: 2023-05-05 | Disposition: A | Discharge status: Home / Self Care | Payer: Medicaid Other | Source: Ambulatory Visit | Attending: Internal Medicine | Admitting: Internal Medicine

## 2023-05-05 DIAGNOSIS — I5042 Chronic combined systolic (congestive) and diastolic (congestive) heart failure: Secondary | ICD-10-CM | POA: Insufficient documentation

## 2023-05-05 DIAGNOSIS — E7521 Fabry (-Anderson) disease: Secondary | ICD-10-CM | POA: Diagnosis not present

## 2023-05-05 DIAGNOSIS — G546 Phantom limb syndrome with pain: Secondary | ICD-10-CM | POA: Diagnosis not present

## 2023-05-05 LAB — BASIC METABOLIC PANEL
Anion gap: 8 (ref 5–15)
BUN: 23 mg/dL — ABNORMAL HIGH (ref 6–20)
CO2: 19 mmol/L — ABNORMAL LOW (ref 22–32)
Calcium: 8.7 mg/dL — ABNORMAL LOW (ref 8.9–10.3)
Chloride: 107 mmol/L (ref 98–111)
Creatinine, Ser: 1.39 mg/dL — ABNORMAL HIGH (ref 0.44–1.00)
GFR, Estimated: 47 mL/min — ABNORMAL LOW (ref >60)
Glucose, Bld: 273 mg/dL — ABNORMAL HIGH (ref 70–99)
Potassium: 4.7 mmol/L (ref 3.5–5.1)
Sodium: 134 mmol/L — ABNORMAL LOW (ref 135–145)

## 2023-05-05 NOTE — Progress Notes (Signed)
H&V Care Navigation CSW Progress Note  Clinical Social Worker met with pt during lab visit and provided with H&V food bag as well as bus passes to assist with food insecurity and transportation concerns.  Patient is participating in a Managed Medicaid Plan:  Yes  SDOH Screenings   Food Insecurity: Food Insecurity Present (05/03/2023)  Housing: High Risk (04/01/2023)  Transportation Needs: Unmet Transportation Needs (05/03/2023)  Utilities: Not At Risk (04/05/2023)  Alcohol Screen: Low Risk  (04/01/2023)  Depression (PHQ2-9): High Risk (04/27/2023)  Financial Resource Strain: High Risk (04/01/2023)  Physical Activity: Insufficiently Active (04/01/2023)  Social Connections: Moderately Isolated (04/01/2023)  Stress: Stress Concern Present (04/01/2023)  Tobacco Use: High Risk (04/27/2023)    Burna Sis, LCSW Clinical Social Worker [facility] Desk#: [phone] Cell#: [phone]

## 2023-05-09 ENCOUNTER — Telehealth (HOSPITAL_COMMUNITY): Payer: Self-pay | Admitting: Licensed Clinical Social Worker

## 2023-05-09 DIAGNOSIS — I5042 Chronic combined systolic (congestive) and diastolic (congestive) heart failure: Secondary | ICD-10-CM

## 2023-05-09 NOTE — Telephone Encounter (Signed)
H&V Care Navigation CSW Progress Note  Clinical Social Worker received call from pt to go through multiple concerns.  Pt inquiring about paramedicine- feeling confused by her medications.  CSW explained she is not eligible at this time.  She is agreeable to managed medicaid referral for RN case management to help with compliance and comprehension- CSW placed.  Pt reports she owes $1,100 in her [facility] [employer] bill.  Was not getting electronic statements so had not been paying anything.  Is overdue by $900 of that.  Pt struggles financially so unable to pay for this. Encouraged her to go to [employer] to inquire about assistance as they have energy assistance available at this time.  Unfortunately pt has utilized her max with patient care fund so unable to pursue at this time.  Pt then expressed desire to get mental health assistance.  Has been referred multiple times and doesn't really like talk therapy but willing to try again- will go to walk in hours at [facility] tomorrow to initiate contact.  Pt also concerned about her childrens mental health- PCPs have referred them for therapy but she has struggled to follow up due to chaotic schedule.  CSW placed referral to managed medicaid team to help her follow up on mental health resources.  Patient is participating in a Managed Medicaid Plan:  Yes  SDOH Screenings   Food Insecurity: Food Insecurity Present (05/03/2023)  Housing: High Risk (04/01/2023)  Transportation Needs: Unmet Transportation Needs (05/03/2023)  Utilities: Not At Risk (04/05/2023)  Alcohol Screen: Low Risk  (04/01/2023)  Depression (PHQ2-9): High Risk (04/27/2023)  Financial Resource Strain: High Risk (04/01/2023)  Physical Activity: Insufficiently Active (04/01/2023)  Social Connections: Moderately Isolated (04/01/2023)  Stress: Stress Concern Present (04/01/2023)  Tobacco Use: High Risk (04/27/2023)       Burna Sis, LCSW Clinical Social Worker [facility]  [facility]inic Desk#: [phone] Cell#: [phone]

## 2023-05-13 ENCOUNTER — Ambulatory Visit (INDEPENDENT_AMBULATORY_CARE_PROVIDER_SITE_OTHER): Payer: Medicaid Other | Admitting: Licensed Clinical Social Worker

## 2023-05-13 ENCOUNTER — Encounter (HOSPITAL_COMMUNITY): Payer: Self-pay | Admitting: Licensed Clinical Social Worker

## 2023-05-13 DIAGNOSIS — F4310 Post-traumatic stress disorder, unspecified: Secondary | ICD-10-CM | POA: Insufficient documentation

## 2023-05-13 DIAGNOSIS — F3181 Bipolar II disorder: Secondary | ICD-10-CM
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
## 2023-05-26 DIAGNOSIS — E7521 Fabry (-Anderson) disease: Secondary | ICD-10-CM | POA: Diagnosis not present
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
## 2023-05-31 ENCOUNTER — Telehealth (HOSPITAL_COMMUNITY): Payer: Self-pay | Admitting: Licensed Clinical Social Worker

## 2023-05-31 NOTE — Telephone Encounter (Signed)
CSW attempted to call pt to check in- unable to reach- left VM requesting return call  Cillian Gwinner H. Alexiah Koroma, LCSW Clinical Social Worker [facility] Desk#: [phone] Cell#: [phone]  

## 2023-06-01 ENCOUNTER — Encounter (HOSPITAL_COMMUNITY): Payer: Self-pay | Admitting: Psychiatry

## 2023-06-01 ENCOUNTER — Ambulatory Visit (HOSPITAL_COMMUNITY): Payer: Medicare Other | Admitting: Psychiatry

## 2023-06-01 DIAGNOSIS — F3130 Bipolar disorder, current episode depressed, mild or moderate severity, unspecified: Secondary | ICD-10-CM | POA: Diagnosis not present

## 2023-06-01 DIAGNOSIS — F4310 Post-traumatic stress disorder, unspecified: Secondary | ICD-10-CM | POA: Diagnosis not present

## 2023-06-01 DIAGNOSIS — F319 Bipolar disorder, unspecified: Secondary | ICD-10-CM

## 2023-06-01 DIAGNOSIS — F1090 Alcohol use, unspecified, uncomplicated: Secondary | ICD-10-CM | POA: Diagnosis not present

## 2023-06-01 MED ORDER — ARIPIPRAZOLE 5 MG PO TABS: 5.0000 mg | ORAL_TABLET | Freq: Every day | ORAL | 2 refills | Status: DC

## 2023-06-01 NOTE — Patient Instructions (Signed)
Thank you for attending your appointment today.  -- START Abilify 5 mg daily -- Continue other medications as prescribed.  Please do not make any changes to medications without first discussing with your provider. If you are experiencing a psychiatric emergency, please call 911 or present to your nearest emergency department. Additional crisis, medication management, and therapy resources are included below.  [facility]  [address], [place], [place] [address] [phone] WALK-IN URGENT CARE 24/7 FOR ANYONE [address], [place], [place]  [phone] Fax: [phone] [email or web address] *Interpreters available *Accepts all insurance and uninsured for Urgent Care needs *Accepts Medicaid and uninsured for outpatient treatment (below)      ONLY FOR [place] Residents  Below:    Outpatient New Patient Assessment/Therapy Walk-ins:        Monday -Thursday 8am until slots are full.        Every Friday 1pm-4pm  (first come, first served)                   New Patient Psychiatry/Medication Management        Monday-Friday 8am-11am (first come, first served)               For all walk-ins we ask that you arrive by 7:15am, because patients will be seen in the order of arrival.   

## 2023-06-13 ENCOUNTER — Encounter: Payer: Medicaid Other | Admitting: Physical Medicine and Rehabilitation

## 2023-06-13 DIAGNOSIS — H40013 Open angle with borderline findings, low risk, bilateral: Secondary | ICD-10-CM | POA: Diagnosis not present

## 2023-06-13 DIAGNOSIS — D492 Neoplasm of unspecified behavior of bone, soft tissue, and skin: Secondary | ICD-10-CM | POA: Diagnosis not present

## 2023-06-15 ENCOUNTER — Telehealth (HOSPITAL_COMMUNITY): Payer: Self-pay | Admitting: Licensed Clinical Social Worker

## 2023-06-15 NOTE — Telephone Encounter (Signed)
CSW attempted to call pt to check in- unable to reach and unable to leave VM  Burna Sis, LCSW Clinical Social Worker [facility] Desk#: [phone] Cell#: [phone]

## 2023-06-27 ENCOUNTER — Encounter (HOSPITAL_COMMUNITY): Payer: Self-pay

## 2023-06-27 ENCOUNTER — Ambulatory Visit (HOSPITAL_COMMUNITY): Payer: Medicare Other | Admitting: Mental Health

## 2023-06-27 DIAGNOSIS — F4310 Post-traumatic stress disorder, unspecified: Secondary | ICD-10-CM

## 2023-06-27 DIAGNOSIS — F319 Bipolar disorder, unspecified: Secondary | ICD-10-CM

## 2023-06-27 NOTE — Progress Notes (Unsigned)
THERAPIST PROGRESS NOTE Virtual Visit via Video Note  I connected with Sarah Long on 06/28/23 at  8:00 AM EDT by a video enabled telemedicine application and verified that I am speaking with the correct person using two identifiers.  Location: Patient: work [employer], trade [employer]:    I discussed the limitations of evaluation and management by telemedicine and the availability of in person appointments. The patient expressed understanding and agreed to proceed.    I discussed the assessment and treatment plan with the patient. The patient was provided an opportunity to ask questions and all were answered. The patient agreed with the plan and demonstrated an understanding of the instructions.   The patient was advised to call back or seek an in-person evaluation if the symptoms worsen or if the condition fails to improve as anticipated.  I provided 55 minutes of non-face-to-face time during this encounter.   Sarah Long, [facility]   Session Time: 8: 02 am ( 55 minutes)  Participation Level: Active  Behavioral Response: CasualAlertDysphoric  Type of Therapy: Individual Therapy  Treatment Goals addressed: STG: Sarah Long will increase ability to cope with stressors AEB development of x 3 coping skills for stress with ability to engage in self-care daily within the next 90 days.   ProgressTowards Goals: Initial  Interventions: CBT and Supportive  Summary: Sarah Long is a 52 y.o. female who presents with dx of Bipolar disorder and PTSD with hx of alcohol use. Sarah Long presents for session alert and oriented; mood and affect adequate. Speech clear and coherent rapid rate; normal tone. Thought process logical; tangential at times. Assessment completed 05/23/23. Shares hx of bipolar disorder and shares to have declined to take medications in the past. Currently prescribed medications but has not taken due to have lost medications. Notes plans to retrieve refill soon. 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 Notes to have been on disability in the past however started to work. Reports to be a single mother of x 4 children and originally from [place]. Denies family in the area but reports to have good support within her church. Shares stressors related to finances and having high degree of medical concerns. Hx of homelessness, living in a hotels in the past. Shares feelings of depression occur in which she can hold crying spells, low mood and anhedonia. Shares hx of manic episodes occurring with increased speech and energy and has hx of being disorganized, poor focus. Shares additional stressor related to recently receiving DWI however denies to feel as if she drinks excessively noting to drink x 1 weekly with friend. Shares stress can be overwhelming at times with hx of using meditation and yoga to cope. Agrees to treatment plan. Denies SI/HI.   Suicidal/Homicidal: Nowithout intent/plan  Therapist Response: Therapist engaged [place] in [employer]. Completed check in and assessed current level of functioning, sxs management and level of functioning. Educated on informed consent and bounds of confidentiality. Engaged in rapport building and explored hx of mental health services and current stressors. Provided support and encouragement; validated feelings. Active empathic listening. Supported in processing concerns for stressors and areas of improvement. Engaged int treatment planning and what progress would look like. Reviewed session and provided follow up.   Plan: Return again in x 6 weeks.  Diagnosis: Bipolar 1 disorder, depressed (HCC)  PTSD (post-traumatic stress disorder)  Collaboration of Care: Other None  Patient/Guardian was advised Release of Information must be obtained prior to any record release in  order to collaborate their care with an outside provider. Patient/Guardian was advised if they have not already done so to contact the registration  department to sign all necessary forms in order for [place] to release information regarding their care.   Consent: Patient/Guardian gives verbal consent for treatment and assignment of benefits for services provided during this visit. Patient/Guardian expressed understanding and agreed to proceed.   Sarah Long, [facility] 06/28/2023

## 2023-06-27 NOTE — Progress Notes (Unsigned)
Established Patient Office Visit  Subjective:  Patient ID: Sarah Long, female    DOB: Oct 28, 1971  Age: 52 y.o. MRN: [id number]  CC:  No chief complaint on file.   HPI 11/2021 Sarah Long presents for primary care follow-up and diabetes management  Since last seen in October the patient has continued improvement.  She has been followed closely by endocrinology and her A1c is now down to 7.4.  It had been greater than 12.  The patient also now has a Dexcom glucometer and has showing data suggesting she is staying in the 100-150 range of her blood sugars.  The patient has had a left below-knee amputation now has a functional orthotic.  Patient is followed by rehab.  Patient's phantom limb pain is being managed by the rehab physician and she states is markedly better.  She has had 1 toe amputated on the right foot.  Recently developed a sore internally in her mouth.  This patient has poor dentition and needs to be seen by dentist.  Also she is living in a hotel and may be losing her housing soon and needs assistance in this regard.  Blood pressure is good on arrival 132/87  4/17 Patient seen in return follow-up unfortunately not been taking her oral medications because she cannot afford the co-pay.  She is taking her insulin products.  On arrival blood pressure is elevated 145/102.  She does have a Dexcom meter and recently saw endocrinology.  Below is documentation from that visit.  Endo 02/2022 Today (02/03/22): Sarah Long is here for a follow up on diabetes management. She checks her  blood sugars multiple times daily through CGM.  The patient has  had hypoglycemic episodes since the last clinic visit, which typically occur rarely and after a bolus.     She has made lifestyle changes with weight loss  Has noted prosthetics    HOME DIABETES REGIMEN: Lantus 35 units daily - takes 45  Humalog 10 units TID QAC- 10-15 units  CF: Humalog (BG -130/25)      Statin: no ACE-I/ARB: no Prior  Diabetic Education: yes  Glycemic patterns summary: BG's within goal at night, high during the day    Hyperglycemic episodes postprandial    Hypoglycemic episodes occurred post bolus   Overnight periods: trends down       DIABETIC COMPLICATIONS: Microvascular complications:  Left BKA, Right  great toe amputation , neuropathy  Denies: CKD, retinopathy  Last eye exam: Completed 01/2021   Macrovascular complications:  PAD Denies: CAD, CVA   1) Type 2 Diabetes Mellitus, Sub-Optimally controlled, With neuropathic complications, S/P Left BKA and Right great toe amputation  - Most recent A1c of 7.9 %. Goal A1c < 7.0 %.       - A1c remains above goal - She unfortunately has self adjusted her insulin regimen, it appears that she has not been using the correction scale, she does admit to guessing on prandial dose has occasional hypoglycemia -We discussed the importance of following instructions so that we make the appropriate changes were necessary -In review of her CGM, I am going to increase her basal as well as prandial dose, she will be provided with a new correction scale - She has a hx of Pancreatitis , not a candidate for DPP 4 inhibitors and GLP-1 agonist - Has hx of DKA , would avoid SGLT2 inhibitors       MEDICATIONS: Increase Lantus to 50 units daily  Take Humalog 12 units TIDQAC  Correction Factor: Humalog (BG -130/25)    EDUCATION / INSTRUCTIONS: BG monitoring instructions: Patient is instructed to check her blood sugars 3 times a day, before meals . Call [facility] if: BG persistently < 70  I reviewed the Rule of 15 for the treatment of hypoglycemia in detail with the patient. Literature supplied.     2) Diabetic complications:  Eye: Does not have known diabetic retinopathy.  Neuro/ Feet: Does  have known diabetic peripheral neuropathy. Renal: Patient does not have known baseline CKD. She is not on an ACEI/ARB at present.     3) Dyslipidemia :      - She is not on atorvastatin, patient does not like taking medication and would rather use natural route if possible -We discussed the increased risk of cardiovascular disease as well as CVA with hyperlipidemia in the setting of diabetes   4) Microalbuminuria:       -She has losartan listed but she is not taking it.  We discussed the renal benefits with ARB and I have encouraged her to consider taking it   Medication Restart losartan 25 mg daily   Patient never restarted the losartan or atorvastatin because she cannot afford the $4 co-pay.  She is using insulin as prescribed.  She still living in a motel with her children but is having difficulty affording the rent.  9/5 Patient seen in return follow-up and is followed by cardiology closely for her cardiomyopathy nonischemic.  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  She is undergoing genetic testing for this.  She does have a pacemaker in place.  Cardiac medicines have been adjusted by cardiology.  Patient is also followed by endocrinology and she is on 70 units of Lantus and a sliding scale Humalog average dose of 14 units before meals.  She is trying to get set up for the insulin pump but is yet to achieve this.  She was seen by ophthalmology in April of this year and found to not have diabetic eye disease.  She is due a colon cancer screening and agrees to receive the Cologuard kit.  She needs dental care follow-up.  Also needs mental health follow-up.  She cannot take Latuda because of her cardiac disease and had itching with Lamictal.  She is due a flu vaccine and agrees to receive this is also due foot exam at this visit  01/13/23 Patient is seen in return follow-up has not been seen since last September.  Patient's had difficulty with her insurance partially approving some of her diabetic supplies including her insulin pump and Dexcom 6.  She has been followed by endocrinology documentation of the last visit is as below.   She still has elevated A1c.  She is on long and short acting insulin at this time.  She is not a candidate for a GLP-1 because of pancreatitis in the past.  Patient does have constipation as well.  She was off cigarettes with use of Chantix she ran out of Chantix now she is back smoking.  Also has mild excess bleeding and cervical vaginal discharge she may yet need gynecology referral.  She also is having some dysuria.  She has a blood pressure meter at home but does not take the blood pressure.  Blood pressure today on arrival is elevated 166/103. Patient is now out of the hotel living in a home.  Her children are with her.  Below is a recent endocrinology note 1) Type 2 Diabetes Mellitus, Poorly  controlled, With neuropathic complications, S/P Left BKA and Right great toe amputation  - Most recent A1c of 8.8 %. Goal A1c < 7.0 %.       - A1c remains above goal - She has a hx of Pancreatitis , not a candidate for DPP 4 inhibitors and GLP-1 agonist - Has hx of DKA , would avoid SGLT2 inhibitors -She has not been on the pump for the past 3 weeks due to insurance coverage, she is working with her current insurance and will be able to eventually stay on Medicaid and will obtain the OmniPod pump.  Patient liked the pump and would like to stay on it -Despite her elevated A1c, we have been able to download her pump through December, her average BG's at the time 142 mg/DL, within range BG's have improved from 26% to 74%.  And hyperglycemia has improved from 44% down to 20%. -Overall if she is able to continue on the pump this will be a better option, the patient was advised that if she has no Dexcom she should continue to check glucose before each meal and entered into the pump while in the manual mode -I also emphasized the importance of being on basal insulin if she is not going to be on the pump for 24 hours or more -A refill of the Lantus was prescribed to use when she is of the pump, patient was advised  to continue to use Humalog 14 units with each meal -Unable to make any changes to the pump settings as the patient did not bring her PDM     MEDICATIONS:   -Take Lantus 60 units daily while of OmniPod -Continue Humalog 14 units with each meal -Patient will return to the current pump settings once the insurance issues have been resolved   Below is recent cardiology note the patient appears to have a form of amyloidosis and is undergoing consideration for treatment at [facility]. Chronic systolic CHF: Echo in 3/21 with EF 40-45%, severe LV hypertrophy.  This was found in association with complete heart block.  Nonischemic cardiomyopathy, cath in 3/21 showed no significant CAD.  PYP scan (6/21) not suggestive of TTR amyloidosis. cMRI (2/23) showed LVEF 68%, severe LVH, ECV 54%, no LVOT gradient, poor nulling with uninterpretable delayed enhancement. CTA chest did not show evidence for pulmonary sarcoidosis.  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  There is wide phenotypic variation in female Fabry's heterozygotes, but I suspect that the cardiomyopathy with LVH and h/o complete heart block in this situation is most likely due to Fabry's disease. She is not volume overloaded on exam with NYHA class II-III symptoms.  - We will continue to work to get her an appt at Dr. Hilda Lias McDonald's office at [facility]. She will need treatment with Fabrazyme (enzyme replacement).  - Continue hydralazine 25 mg tid + Imdur 20 mg daily.  - Continue Lasix 40 qam/20 qpm. BMET today.  - Continue spironolactone 25 mg daily.  - Continue Coreg 6.25 mg bid. - Continue losartan 50 mg daily. - Echo at followup appt.  2. HTN: Mildly elevated today, but generally well-controlled at home. No changes for now. 3. Hyperlipidemia: Good lipids 5/23. 4. H/o smoking: Quit x 1 month.    - Continue Chantix. 5. Type 2 diabetes: Followed by  Endocrinology. She is on insulin. 6. Complete heart block: She has a Secondary school teacher PPM and is pacing  her RV > 99% of the time.  It is possible that this could be related to Fabry's disease.  7. Obesity: Body mass index is 36.39 kg/m. - Considered referral for GLP1RA, but she has history of pancreatitis. 8. CKD stage 3: Suspect diabetic nephropathy.  This may be related to Fabry's disease as well.  - BMET today.   03/15/23 she has been without medication for 1 month due to insurance barriers.  The patient is seen in return follow-up she now has achieved Medicaid and can get medications again unfortunately with this blood pressure is elevated as is blood sugar.  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  She has changes in the right foot and may need to orthopedics again.  She does have housing and is renting this.  She is no longer drinking alcohol.  04/27/23 Patient seen in return follow-up comes in a very depressed tearful state.  She needs behavioral health history of bipolar not on medications.  She stays exhausted.  She has been off bipolar meds for 5 years.  Blood pressure is elevated 159/83 she takes her blood pressure at home it is around 138/88.  Patient had been recently seen by cardiology labs showed elevated potassium she stopped her Aldactone and losartan from cardiology and today blood pressure remains high she also has a vaginal discharge and did have a urine culture with cardiology UA showed hematuria and increased protein and increased glucose.  Patient needs medication refills.  She has a G6 transmitter and this needs to be refilled we did message endocrinology they will take care of this.  We also message cardiology about what is the follow-up without Aldactone and losartan  Orthopedic recently saw the patient and stated her right foot was improved  06/29/23  Past Medical History:  Diagnosis Date   Abnormal Pap smear 1998   Abnormal vaginal bleeding 12/19/2011    Acute urinary retention 06/26/2021   Arthritis    Bacterial infection    Bipolar 1 disorder (HCC)    Blister of second toe of left foot 11/21/2016   Candida vaginitis 07/2007   Depression    recently added wellbutrin-has not taken yet for bipolar   Diabetes in pregnancy    Diabetes mellitus    nph 20U qam and qpm, regular with meals   Diabetic ketoacidosis without coma associated with type 2 diabetes mellitus (HCC)    Fibroid    Galactorrhea of right breast 2008   GERD (gastroesophageal reflux disease)    H/O amenorrhea 07/2007   H/O dizziness 10/14/2011   H/O dysmenorrhea 2010   H/O menorrhagia 10/14/2011   H/O varicella    Headache(784.0)    Heavy vaginal bleeding due to contraceptive injection use 10/12/2011   Depo Provera   Herpes    Homelessness 11/19/2021   HSV-2 infection 01/03/2009   Hx: UTI (urinary tract infection) 2009   Hypertension    on aldomet   Hypoalbuminemia due to protein-calorie malnutrition (HCC) 06/26/2021   Increased BMI 2010   Irregular uterine bleeding 04/04/2012   Pt has mirena    Obesity 10/14/2011   Oligomenorrhea 07/2007   Osteomyelitis of great toe of right foot (HCC) 03/22/2022   Pelvic pain in female    Presence of permanent cardiac pacemaker    Preterm labor    PTSD (post-traumatic stress disorder)    Trichomonas    Yeast infection     Past Surgical History:  Procedure Laterality Date   ABDOMINAL  AORTOGRAM W/LOWER EXTREMITY N/A 06/17/2021   Procedure: ABDOMINAL AORTOGRAM W/LOWER EXTREMITY;  Surgeon: Iran Ouch, MD;  Location: [facility] INVASIVE [facility] LAB;  Service: Cardiovascular;  Laterality: N/A;   AMPUTATION Left 06/27/2021   Procedure: AMPUTATION BELOW KNEE;  Surgeon: Nadara Mustard, MD;  Location: [facility] OR;  Service: Orthopedics;  Laterality: Left;   AMPUTATION Right 09/04/2021   Procedure: RIGHT GREAT TOE AMPUTATION;  Surgeon: Nadara Mustard, MD;  Location: [facility] OR;  Service: Orthopedics;  Laterality: Right;   CESAREAN SECTION  1991    LEFT HEART CATH AND CORONARY ANGIOGRAPHY N/A 02/10/2020   Procedure: LEFT HEART CATH AND CORONARY ANGIOGRAPHY;  Surgeon: Lennette Bihari, MD;  Location: [facility] INVASIVE CV LAB;  Service: Cardiovascular;  Laterality: N/A;   PACEMAKER IMPLANT N/A 02/13/2020   Procedure: PACEMAKER IMPLANT;  Surgeon: Regan Lemming, MD;  Location: [facility];  Service: Cardiovascular;  Laterality: N/A;   TEMPORARY PACEMAKER N/A 02/10/2020   Procedure: TEMPORARY PACEMAKER;  Surgeon: Lennette Bihari, MD;  Location: [facility] INVASIVE CV LAB;  Service: Cardiovascular;  Laterality: N/A;    Family History  Problem Relation Age of Onset   Hypertension Mother    Diabetes Mother    Heart disease Mother    Hypertension Father    Diabetes Father    Heart disease Father    Stroke Maternal Grandfather    Depression Maternal Grandmother    Suicidality Maternal Grandmother    Schizophrenia Paternal Grandmother    Other Neg Hx    Breast cancer Neg Hx     Social History   Socioeconomic History   Marital status: Significant Other    Spouse name: Not on file   Number of children: Not on file   Years of education: Not on file   Highest education level: Some college, no degree  Occupational History   Not on file  Tobacco Use   Smoking status: Every Day    Current packs/day: 0.00    Average packs/day: 0.5 packs/day for 20.0 years (10.0 ttl pk-yrs)    Types: Cigarettes    Start date: 07/01/2001    Last attempt to quit: 07/01/2021    Years since quitting: 1.9   Smokeless tobacco: Never   Tobacco comments:    1. Centrix to stop smoking   Vaping Use   Vaping status: Never Used  Substance and Sexual Activity   Alcohol use: Yes    Comment: 2 beers on the weekend; reports history of heavy daily use in the past   Drug use: No   Sexual activity: Yes    Partners: Female, Female    Birth control/protection: None  Other Topics Concern   Not on file  Social History Narrative   Not on file   Social Determinants of  Health   Financial Resource Strain: High Risk (05/13/2023)   Overall Financial Resource Strain (CARDIA)    Difficulty of Paying Living Expenses: Very hard  Food Insecurity: Food Insecurity Present (05/03/2023)   Hunger Vital Sign    Worried About Running Out of Food in the Last Year: Sometimes true    Ran Out of Food in the Last Year: Sometimes true  Transportation Needs: Unmet Transportation Needs (05/03/2023)   PRAPARE - Transportation    Lack of Transportation (Medical): Yes    Lack of Transportation (Non-Medical): Yes  Physical Activity: Insufficiently Active (04/01/2023)   Exercise Vital Sign    Days of Exercise per Week: 3 days    Minutes of Exercise per  Session: 20 min  Stress: Stress Concern Present (04/01/2023)   [employer] of Occupational Health - Occupational Stress Questionnaire    Feeling of Stress : Very much  Social Connections: Moderately Isolated (04/01/2023)   Social Connection and Isolation Panel [NHANES]    Frequency of Communication with Friends and Family: Once a week    Frequency of Social Gatherings with Friends and Family: Once a week    Attends Religious Services: More than 4 times per year    Active Member of [employer] or Organizations: Yes    Attends Engineer, structural: More than 4 times per year    Marital Status: Divorced  Intimate Partner Violence: Not At Risk (04/05/2023)   Humiliation, Afraid, Rape, and Kick questionnaire    Fear of Current or Ex-Partner: No    Emotionally Abused: No    Physically Abused: No    Sexually Abused: No    Outpatient Medications Prior to Visit  Medication Sig Dispense Refill   acetaminophen (TYLENOL) 325 MG tablet Take 650 mg by mouth every 6 (six) hours as needed (pain). (Patient not taking: Reported on 06/01/2023)     ARIPiprazole (ABILIFY) 5 MG tablet Take 1 tablet (5 mg total) by mouth daily. 30 tablet 2   atorvastatin (LIPITOR) 20 MG tablet Take 1 tablet (20 mg total) by mouth daily. (Patient not taking:  Reported on 06/01/2023) 90 tablet 3   Blood Pressure Monitoring (BLOOD PRESSURE MONITOR 7) DEVI Measure blood pressure daily (Patient not taking: Reported on 06/01/2023) 1 each 0   carvedilol (COREG) 12.5 MG tablet Take 1 tablet (12.5 mg total) by mouth 2 (two) times daily with a meal. (Patient not taking: Reported on 06/01/2023) 120 tablet 1   Continuous Blood Gluc Sensor (DEXCOM G6 SENSOR) MISC Use to check blood sugar three times daily. Change sensors once every 10 days. Dx code: E11.42 (Patient not taking: Reported on 06/01/2023) 3 each 3   Continuous Glucose Transmitter (DEXCOM G6 TRANSMITTER) MISC Use to check blood sugar three times daily. Change once every 90 days. Dx code: E11.42 (Patient not taking: Reported on 06/01/2023) 1 each 3   dapagliflozin propanediol (FARXIGA) 10 MG TABS tablet Take 1 tablet (10 mg total) by mouth daily before breakfast. (Patient not taking: Reported on 06/01/2023) 30 tablet 11   furosemide (LASIX) 20 MG tablet Patient takes 2 tablets by mouth daily. (Patient not taking: Reported on 06/01/2023)     hydrALAZINE (APRESOLINE) 25 MG tablet Take 1 tablet (25 mg total) by mouth 3 (three) times daily. (Patient not taking: Reported on 06/01/2023) 270 tablet 3   Insulin Disposable Pump (OMNIPOD 5 G6 POD, GEN 5,) MISC 1 Device by Does not apply route every other day. (Patient not taking: Reported on 06/01/2023) 9 each 3   insulin glargine (LANTUS SOLOSTAR) 100 UNIT/ML Solostar Pen Inject 60 Units into the skin daily. (Patient not taking: Reported on 06/01/2023) 60 mL 3   insulin lispro (HUMALOG) 100 UNIT/ML injection Max daily 125 units a day per pump (Patient not taking: Reported on 06/01/2023) 40 mL 11   Insulin Pen Needle 31G X 5 MM MISC Use as directed in the morning, at noon, in the evening, and at bedtime. (Patient not taking: Reported on 06/01/2023) 400 each 3   isosorbide mononitrate (ISMO) 20 MG tablet Take 1 tablet (20 mg total) by mouth daily. (Patient not taking: Reported on  06/01/2023) 90 tablet 3   latanoprost (XALATAN) 0.005 % ophthalmic solution Place 1 drop into both eyes at  bedtime. (Patient not taking: Reported on 06/01/2023)     varenicline (CHANTIX CONTINUING MONTH PAK) 1 MG tablet Take 1 tablet (1 mg total) by mouth 2 (two) times daily. (Patient not taking: Reported on 06/01/2023) 60 tablet 1   No facility-administered medications prior to visit.    Allergies  Allergen Reactions   Lamictal [Lamotrigine] Itching   Strawberry Extract Hives   Sulfa Antibiotics Hives and Itching    ROS Review of Systems  Constitutional:  Negative for chills, diaphoresis, fever and unexpected weight change.  HENT: Negative.  Negative for congestion, ear pain, hearing loss, nosebleeds, postnasal drip, rhinorrhea, sinus pressure, sore throat, tinnitus, trouble swallowing and voice change.   Eyes: Negative.  Negative for photophobia and redness.  Respiratory: Negative.  Negative for apnea, cough, choking, chest tightness, shortness of breath, wheezing and stridor.   Cardiovascular: Negative.  Negative for chest pain, palpitations and leg swelling.  Gastrointestinal: Negative.  Negative for abdominal distention, abdominal pain, blood in stool, constipation, diarrhea, nausea and vomiting.  Endocrine: Negative for polydipsia.  Genitourinary:  Negative for dysuria, flank pain, frequency, hematuria, urgency and vaginal discharge.  Musculoskeletal: Negative.  Negative for arthralgias, back pain, myalgias and neck pain.       Vascular changes right foot second toe  Skin: Negative.  Negative for rash.  Allergic/Immunologic: Negative.  Negative for environmental allergies and food allergies.  Neurological: Negative.  Negative for dizziness, tremors, seizures, syncope, weakness and headaches.  Hematological: Negative.  Negative for adenopathy. Does not bruise/bleed easily.  Psychiatric/Behavioral:  Positive for behavioral problems, decreased concentration and dysphoric mood. Negative  for agitation, self-injury, sleep disturbance and suicidal ideas. The patient is nervous/anxious.       Objective:    Physical Exam Vitals reviewed.  Constitutional:      Appearance: Normal appearance. She is well-developed. She is obese. She is not diaphoretic.  HENT:     Head: Normocephalic and atraumatic.     Nose: No nasal deformity, septal deviation, mucosal edema or rhinorrhea.     Right Sinus: No maxillary sinus tenderness or frontal sinus tenderness.     Left Sinus: No maxillary sinus tenderness or frontal sinus tenderness.     Mouth/Throat:     Mouth: Mucous membranes are moist.     Pharynx: No oropharyngeal exudate.     Comments: There is a slight ulceration inside the right cheek mucosa and there is a fractured molar which likely has caused the injury Eyes:     General: No scleral icterus.    Conjunctiva/sclera: Conjunctivae normal.     Pupils: Pupils are equal, round, and reactive to light.  Neck:     Thyroid: No thyromegaly.     Vascular: No carotid bruit or JVD.     Trachea: Trachea normal. No tracheal tenderness or tracheal deviation.  Cardiovascular:     Rate and Rhythm: Normal rate and regular rhythm.     Chest Wall: PMI is not displaced.     Pulses: Normal pulses. No decreased pulses.     Heart sounds: Normal heart sounds, S1 normal and S2 normal. Heart sounds not distant. No murmur heard.    No systolic murmur is present.     No diastolic murmur is present.     No friction rub. No gallop. No S3 or S4 sounds.  Pulmonary:     Effort: No tachypnea, accessory muscle usage or respiratory distress.     Breath sounds: No stridor. No decreased breath sounds, wheezing, rhonchi or rales.  Chest:  Chest wall: No tenderness.  Abdominal:     General: Bowel sounds are normal. There is no distension.     Palpations: Abdomen is soft. Abdomen is not rigid.     Tenderness: There is no abdominal tenderness. There is no guarding or rebound.  Musculoskeletal:         General: Normal range of motion.     Cervical back: Normal range of motion and neck supple. No edema, erythema or rigidity. No muscular tenderness. Normal range of motion.     Right lower leg: No tenderness. No edema.     Comments: Orthotic on left below-knee amputation left leg functioning well Blackened second toe right foot stable  Lymphadenopathy:     Head:     Right side of head: No submental or submandibular adenopathy.     Left side of head: No submental or submandibular adenopathy.     Cervical: No cervical adenopathy.  Skin:    General: Skin is warm and dry.     Coloration: Skin is not pale.     Findings: No rash.     Nails: There is no clubbing.  Neurological:     General: No focal deficit present.     Mental Status: She is alert and oriented to person, place, and time.     Sensory: No sensory deficit.  Psychiatric:        Attention and Perception: Attention and perception normal.        Mood and Affect: Mood is anxious and depressed. Affect is tearful.        Speech: Speech normal.        Behavior: Behavior normal. Behavior is cooperative.        Thought Content: Thought content normal. Thought content does not include homicidal or suicidal ideation. Thought content does not include homicidal or suicidal plan.        Cognition and Memory: Cognition normal.        Judgment: Judgment normal.     There were no vitals taken for this visit. Wt Readings from Last 3 Encounters:  04/27/23 213 lb (96.6 kg)  04/26/23 213 lb (96.6 kg)  03/15/23 211 lb 3.2 oz (95.8 kg)     Health Maintenance Due  Topic Date Due   Fecal DNA (Cologuard)  Never done   OPHTHALMOLOGY EXAM  03/11/2023     There are no preventive care reminders to display for this patient.  Lab Results  Component Value Date   TSH 4.396 02/11/2020   Lab Results  Component Value Date   WBC 8.0 11/23/2022   HGB 10.9 (L) 11/23/2022   HCT 33.9 (L) 11/23/2022   MCV 91.1 11/23/2022   PLT 198 11/23/2022    Lab Results  Component Value Date   NA 134 (L) 05/05/2023   K 4.7 05/05/2023   CO2 19 (L) 05/05/2023   GLUCOSE 273 (H) 05/05/2023   BUN 23 (H) 05/05/2023   CREATININE 1.39 (H) 05/05/2023   BILITOT <0.2 04/05/2023   ALKPHOS 79 04/05/2023   AST 11 04/05/2023   ALT 10 04/05/2023   PROT 6.2 04/05/2023   ALBUMIN 3.4 (L) 04/05/2023   CALCIUM 8.7 (L) 05/05/2023   ANIONGAP 8 05/05/2023   EGFR 50 (L) 04/05/2023   Lab Results  Component Value Date   CHOL 148 04/05/2023   Lab Results  Component Value Date   HDL 52 04/05/2023   Lab Results  Component Value Date   LDLCALC 73 04/05/2023   Lab Results  Component Value Date   TRIG 130 04/05/2023   Lab Results  Component Value Date   CHOLHDL 2.8 04/05/2023   Lab Results  Component Value Date   HGBA1C 10.9 (H) 04/05/2023      Assessment & Plan:   Problem List Items Addressed This Visit   None  38 minutes spent complex decision making patient extremely upset follow-up: No follow-ups on file.   Shan Levans, MD

## 2023-06-28 ENCOUNTER — Encounter: Payer: Self-pay | Admitting: Critical Care Medicine

## 2023-06-28 ENCOUNTER — Ambulatory Visit: Payer: Medicaid Other | Attending: Critical Care Medicine | Admitting: Critical Care Medicine

## 2023-06-28 VITALS — BP 170/101 | HR 80 | Wt 218.4 lb

## 2023-06-28 DIAGNOSIS — R21 Rash and other nonspecific skin eruption: Secondary | ICD-10-CM | POA: Diagnosis not present

## 2023-06-28 DIAGNOSIS — Z794 Long term (current) use of insulin: Secondary | ICD-10-CM | POA: Diagnosis not present

## 2023-06-28 DIAGNOSIS — E119 Type 2 diabetes mellitus without complications: Secondary | ICD-10-CM

## 2023-06-28 DIAGNOSIS — I10 Essential (primary) hypertension: Secondary | ICD-10-CM | POA: Diagnosis not present

## 2023-06-28 DIAGNOSIS — E7800 Pure hypercholesterolemia, unspecified: Secondary | ICD-10-CM | POA: Diagnosis not present

## 2023-06-28 DIAGNOSIS — I5042 Chronic combined systolic (congestive) and diastolic (congestive) heart failure: Secondary | ICD-10-CM | POA: Diagnosis not present

## 2023-06-28 DIAGNOSIS — E1165 Type 2 diabetes mellitus with hyperglycemia: Secondary | ICD-10-CM

## 2023-06-28 DIAGNOSIS — Z7984 Long term (current) use of oral hypoglycemic drugs: Secondary | ICD-10-CM | POA: Diagnosis not present

## 2023-06-28 DIAGNOSIS — Z7985 Long-term (current) use of injectable non-insulin antidiabetic drugs: Secondary | ICD-10-CM

## 2023-06-28 LAB — GLUCOSE, POCT (MANUAL RESULT ENTRY): POC Glucose: 324 mg/dl — AB (ref 70–99)

## 2023-06-28 MED ORDER — ISOSORBIDE MONONITRATE 20 MG PO TABS: 20.0000 mg | ORAL_TABLET | Freq: Every day | ORAL | 3 refills | Status: DC

## 2023-06-28 MED ORDER — VALSARTAN 320 MG PO TABS: 320.0000 mg | ORAL_TABLET | Freq: Every day | ORAL | 3 refills | Status: DC

## 2023-06-28 MED ORDER — CLOTRIMAZOLE-BETAMETHASONE 1-0.05 % EX CREA: 1.0000 | TOPICAL_CREAM | Freq: Every day | CUTANEOUS | 0 refills | Status: DC

## 2023-06-28 MED ORDER — ATORVASTATIN CALCIUM 20 MG PO TABS: 20.0000 mg | ORAL_TABLET | Freq: Every day | ORAL | 3 refills | Status: DC

## 2023-06-28 MED ORDER — DAPAGLIFLOZIN PROPANEDIOL 10 MG PO TABS: 10.0000 mg | ORAL_TABLET | Freq: Every day | ORAL | 11 refills | Status: DC

## 2023-06-28 MED ORDER — HYDRALAZINE HCL 50 MG PO TABS: ORAL_TABLET | ORAL | 1 refills | Status: DC

## 2023-06-28 MED ORDER — FUROSEMIDE 20 MG PO TABS: 20.0000 mg | ORAL_TABLET | Freq: Two times a day (BID) | ORAL | 1 refills | Status: DC

## 2023-06-28 MED ORDER — CARVEDILOL 12.5 MG PO TABS: 12.5000 mg | ORAL_TABLET | Freq: Two times a day (BID) | ORAL | 1 refills | Status: DC

## 2023-06-28 NOTE — Assessment & Plan Note (Signed)
Resume statin 

## 2023-06-28 NOTE — Assessment & Plan Note (Signed)
Right lower buttocks will treat with Lotrisone cream

## 2023-06-28 NOTE — Assessment & Plan Note (Signed)
Diabetes out-of-control due to lack of use of insulin products and [place].  Refills made on all medications patient encouraged to use short acting insulin she can get in with endocrinology Omnipod set up

## 2023-06-28 NOTE — Assessment & Plan Note (Signed)
Nonadherence to medication asked to resume medication and check metabolic panel

## 2023-06-28 NOTE — Patient Instructions (Addendum)
Take short acting insulin and long-acting insulin as before until you see the endocrinologist  Multiple medications were refilled sent to [facility] you will increase the hydralazine to 50 mg 3 times a day you will begin valsartan daily labs to check potassium will be done today  Return to our clinic 2 months and please keep your appointment with endocrinology this week  Heart failure clinic will call you please make the appointment

## 2023-06-29 ENCOUNTER — Telehealth: Payer: Self-pay

## 2023-06-29 LAB — BMP8+EGFR
BUN/Creatinine Ratio: 19 (ref 9–23)
BUN: 25 mg/dL — ABNORMAL HIGH (ref 6–24)
CO2: 17 mmol/L — ABNORMAL LOW (ref 20–29)
Calcium: 8.9 mg/dL (ref 8.7–10.2)
Chloride: 102 mmol/L (ref 96–106)
Creatinine, Ser: 1.33 mg/dL — ABNORMAL HIGH (ref 0.57–1.00)
Glucose: 293 mg/dL — ABNORMAL HIGH (ref 70–99)
Potassium: 4.9 mmol/L (ref 3.5–5.2)
Sodium: 134 mmol/L (ref 134–144)
eGFR: 49 mL/min/{1.73_m2} — ABNORMAL LOW (ref >59)

## 2023-06-29 NOTE — Telephone Encounter (Signed)
-----   Message from Shan Levans sent at 06/29/2023  5:50 AM EDT ----- Let pt know kidney stable needs to resume meds as told on visit yesterday

## 2023-06-29 NOTE — Progress Notes (Signed)
Let pt know kidney stable needs to resume meds as told on visit yesterday

## 2023-06-29 NOTE — Telephone Encounter (Signed)
Pt was called and is aware of results, DOB was confirmed.  ?

## 2023-06-30 ENCOUNTER — Encounter: Payer: Self-pay | Admitting: Internal Medicine

## 2023-06-30 ENCOUNTER — Ambulatory Visit (INDEPENDENT_AMBULATORY_CARE_PROVIDER_SITE_OTHER): Payer: 59 | Admitting: Internal Medicine

## 2023-06-30 VITALS — BP 140/88 | HR 74 | Ht 64.0 in | Wt 219.0 lb

## 2023-06-30 DIAGNOSIS — E1142 Type 2 diabetes mellitus with diabetic polyneuropathy: Secondary | ICD-10-CM

## 2023-06-30 DIAGNOSIS — E1165 Type 2 diabetes mellitus with hyperglycemia: Secondary | ICD-10-CM | POA: Diagnosis not present

## 2023-06-30 DIAGNOSIS — Z89411 Acquired absence of right great toe: Secondary | ICD-10-CM | POA: Diagnosis not present

## 2023-06-30 DIAGNOSIS — E1129 Type 2 diabetes mellitus with other diabetic kidney complication: Secondary | ICD-10-CM

## 2023-06-30 DIAGNOSIS — Z794 Long term (current) use of insulin: Secondary | ICD-10-CM

## 2023-06-30 DIAGNOSIS — Z89512 Acquired absence of left leg below knee: Secondary | ICD-10-CM

## 2023-06-30 DIAGNOSIS — R809 Proteinuria, unspecified: Secondary | ICD-10-CM

## 2023-06-30 LAB — POCT GLUCOSE (DEVICE FOR HOME USE): Glucose Fasting, POC: 183 mg/dL — AB (ref 70–99)

## 2023-06-30 LAB — POCT GLYCOSYLATED HEMOGLOBIN (HGB A1C): Hemoglobin A1C: 10.9 % — AB (ref 4.0–5.6)

## 2023-06-30 MED ORDER — DEXCOM G7 SENSOR MISC: 1.0000 | 3 refills | Status: DC

## 2023-06-30 MED ORDER — OMNIPOD 5 G7 PODS (GEN 5) MISC: 1.0000 | 3 refills | Status: DC

## 2023-06-30 MED ORDER — LANTUS SOLOSTAR 100 UNIT/ML ~~LOC~~ SOPN: 60.0000 [IU] | PEN_INJECTOR | Freq: Every day | SUBCUTANEOUS | 3 refills | Status: DC

## 2023-06-30 MED ORDER — INSULIN PEN NEEDLE 31G X 5 MM MISC: 1.0000 | Freq: Four times a day (QID) | 3 refills | Status: DC

## 2023-06-30 MED ORDER — OMNIPOD 5 G7 INTRO (GEN 5) KIT: 1.0000 | PACK | 0 refills | Status: DC

## 2023-06-30 NOTE — Progress Notes (Signed)
Name: Sarah Long  MRN/ DOB: [id number], March 22, 1971   Age/ Sex: 52 y.o., female    PCP: [name], MD   Reason for Endocrinology Evaluation: Type 2 Diabetes Mellitus     Date of Initial Endocrinology Visit: 11/02/2021    PATIENT IDENTIFIER: Sarah Long is a 52 y.o. female with a past medical history of HTN , Bipolar d/o,and T2DM, has Hx of Pancreatitis . The patient presented for initial endocrinology clinic visit on 11/02/2021 for consultative assistance with her diabetes management.    HPI: Sarah Long was    Diagnosed with DM at age 47 stated as gestational diabetes by age 13 she became diagnosed with T2 DM  Prior Medications tried/Intolerance: Intolerant to Metformin.  Hemoglobin A1c has ranged from 7.4% in 2022, peaking at 13.8% in 2014. Patient has required hospitalization within the last 1 year from hyper or hypoglycemia: DKA 06/2021   On her initial visit to our clinic she had an A1c 7.4%, we continued MDI regimen and provided her with correction scale    She was trained on OmniPod 59/20/2023  SUBJECTIVE:   During the last visit (12/15/2022): A1c 8.8%     Today (06/30/23): Sarah Long is here for a follow up on diabetes management. She checks her  blood sugars multiple times daily through CGM.  The patient has  not had hypoglycemic episodes since the last clinic visit.   She continues to follow-up with Dr. Lajoyce Corners for left BKA and limb pain  She has not been taking insulin in a while nor has she used the pump   Has occasional nausea and vomiting  Denies constipation or diarrhea     HOME DIABETES REGIMEN: Lantus 60 units daily  Humalog 14 units TIDQAC     Statin: no ACE-I/ARB: no Prior Diabetic Education: yes   CONTINUOUS GLUCOSE MONITORING RECORD INTERPRETATION  N/A    DIABETIC COMPLICATIONS: Microvascular complications:  Left BKA, Right  great toe amputation , neuropathy  Denies: CKD, retinopathy  Last eye exam: Completed  01/2021  Macrovascular complications:  PAD Denies: CAD, CVA   PAST HISTORY: Past Medical History:  Past Medical History:  Diagnosis Date   Abnormal Pap smear 1998   Abnormal vaginal bleeding 12/19/2011   Acute urinary retention 06/26/2021   Arthritis    Bacterial infection    Bipolar 1 disorder (HCC)    Blister of second toe of left foot 11/21/2016   Candida vaginitis 07/2007   Depression    recently added wellbutrin-has not taken yet for bipolar   Diabetes in pregnancy    Diabetes mellitus    nph 20U qam and qpm, regular with meals   Diabetic ketoacidosis without coma associated with type 2 diabetes mellitus (HCC)    Fibroid    Galactorrhea of right breast 2008   GERD (gastroesophageal reflux disease)    H/O amenorrhea 07/2007   H/O dizziness 10/14/2011   H/O dysmenorrhea 2010   H/O menorrhagia 10/14/2011   H/O varicella    Headache(784.0)    Heavy vaginal bleeding due to contraceptive injection use 10/12/2011   Depo Provera   Herpes    Homelessness 11/19/2021   HSV-2 infection 01/03/2009   Hx: UTI (urinary tract infection) 2009   Hypertension    on aldomet   Hypoalbuminemia due to protein-calorie malnutrition (HCC) 06/26/2021   Increased BMI 2010   Irregular uterine bleeding 04/04/2012   Pt has mirena    Obesity 10/14/2011   Oligomenorrhea 07/2007   Osteomyelitis of great  toe of right foot (HCC) 03/22/2022   Pelvic pain in female    Presence of permanent cardiac pacemaker    Preterm labor    PTSD (post-traumatic stress disorder)    Trichomonas    Yeast infection    Past Surgical History:  Past Surgical History:  Procedure Laterality Date   ABDOMINAL AORTOGRAM W/LOWER EXTREMITY N/A 06/17/2021   Procedure: ABDOMINAL AORTOGRAM W/LOWER EXTREMITY;  Surgeon: Iran Ouch, MD;  Location: [facility];  Service: Cardiovascular;  Laterality: N/A;   AMPUTATION Left 06/27/2021   Procedure: AMPUTATION BELOW KNEE;  Surgeon: Nadara Mustard, MD;  Location: [facility]  OR;  Service: Orthopedics;  Laterality: Left;   AMPUTATION Right 09/04/2021   Procedure: RIGHT GREAT TOE AMPUTATION;  Surgeon: Nadara Mustard, MD;  Location[facility] OR;  Service: Orthopedics;  Laterality: Right;   CESAREAN SECTION  1991   LEFT HEART CATH AND CORONARY ANGIOGRAPHY N/A 02/10/2020   Procedure: LEFT HEART CATH AND CORONARY ANGIOGRAPHY;  Surgeon: Lennette Bihari, MD;  Location: [facility];  Service: Cardiovascular;  Laterality: N/A;   PACEMAKER IMPLANT N/A 02/13/2020   Procedure: PACEMAKER IMPLANT;  Surgeon: Regan Lemming, MD;  Location: [facility];  Service: Cardiovascular;  Laterality: N/A;   TEMPORARY PACEMAKER N/A 02/10/2020   Procedure: TEMPORARY PACEMAKER;  Surgeon: Lennette Bihari, MD;  Location: [facility] INVASIVE CV LAB;  Service: Cardiovascular;  Laterality: N/A;    Social History:  reports that she has been smoking cigarettes. She started smoking about 22 years ago. She has a 10 pack-year smoking history. She has never used smokeless tobacco. She reports current alcohol use. She reports that she does not use drugs. Family History:  Family History  Problem Relation Age of Onset   Hypertension Mother    Diabetes Mother    Heart disease Mother    Hypertension Father    Diabetes Father    Heart disease Father    Stroke Maternal Grandfather    Depression Maternal Grandmother    Suicidality Maternal Grandmother    Schizophrenia Paternal Grandmother    Other Neg Hx    Breast cancer Neg Hx      HOME MEDICATIONS: Allergies as of 06/30/2023       Reactions   Lamictal [lamotrigine] Itching   Strawberry Extract Hives   Sulfa Antibiotics Hives, Itching        Medication List        Accurate as of June 30, 2023  6:49 AM. If you have any questions, ask your nurse or doctor.          acetaminophen 325 MG tablet Commonly known as: TYLENOL Take 650 mg by mouth every 6 (six) hours as needed (pain).   ARIPiprazole 5 MG tablet Commonly known as:  ABILIFY Take 1 tablet (5 mg total) by mouth daily.   atorvastatin 20 MG tablet Commonly known as: LIPITOR Take 1 tablet (20 mg total) by mouth daily.   Blood Pressure Monitor 7 Devi Measure blood pressure daily   carvedilol 12.5 MG tablet Commonly known as: COREG Take 1 tablet (12.5 mg total) by mouth 2 (two) times daily with a meal.   clotrimazole-betamethasone cream Commonly known as: LOTRISONE Apply 1 Application topically daily.   dapagliflozin propanediol 10 MG Tabs tablet Commonly known as: Farxiga Take 1 tablet (10 mg total) by mouth daily before breakfast.   Dexcom G6 Sensor Misc Use to check blood sugar three times daily. Change sensors once every 10 days. Dx  code: E11.42   Dexcom G6 Transmitter Misc Use to check blood sugar three times daily. Change once every 90 days. Dx code: E11.42   furosemide 20 MG tablet Commonly known as: LASIX Take 1 tablet (20 mg total) by mouth 2 (two) times daily. Patient takes 2 tablets by mouth daily.   hydrALAZINE 50 MG tablet Commonly known as: APRESOLINE Take one three times daily   insulin lispro 100 UNIT/ML injection Commonly known as: HumaLOG Max daily 125 units a day per pump   Insulin Pen Needle 31G X 5 MM Misc Use as directed in the morning, at noon, in the evening, and at bedtime.   isosorbide mononitrate 20 MG tablet Commonly known as: ISMO Take 1 tablet (20 mg total) by mouth daily.   Lantus SoloStar 100 UNIT/ML Solostar Pen Generic drug: insulin glargine Inject 60 Units into the skin daily.   latanoprost 0.005 % ophthalmic solution Commonly known as: XALATAN Place 1 drop into both eyes at bedtime.   Omnipod 5 G6 Pods (Gen 5) Misc 1 Device by Does not apply route every other day.   valsartan 320 MG tablet Commonly known as: DIOVAN Take 1 tablet (320 mg total) by mouth daily.   varenicline 1 MG tablet Commonly known as: Chantix Continuing Month Pak Take 1 tablet (1 mg total) by mouth 2 (two) times  daily.         ALLERGIES: Allergies  Allergen Reactions   Lamictal [Lamotrigine] Itching   Strawberry Extract Hives   Sulfa Antibiotics Hives and Itching     REVIEW OF SYSTEMS: A comprehensive ROS was conducted with the patient and is negative except as per HPI     OBJECTIVE:   VITAL SIGNS: There were no vitals taken for this visit.   PHYSICAL EXAM:  General: Pt appears well and is in NAD  Lungs: Clear with good BS bilat   Heart: RRR   Extremities: Left BKA   Neuro: MS is good with appropriate affect, pt is alert and Ox3   DM Foot Exam 06/30/2023  Left below-knee amputation  Right great toe amputation  the pedal pulses 1+ on right  The sensation is absent on the right   DATA REVIEWED:  Lab Results  Component Value Date   HGBA1C 10.9 (H) 04/05/2023   HGBA1C 8.8 (A) 12/15/2022   HGBA1C 8.5 (A) 06/14/2022    Latest Reference Range & Units 06/28/23 09:54  Sodium 134 - 144 mmol/L 134  Potassium 3.5 - 5.2 mmol/L 4.9  Chloride 96 - 106 mmol/L 102  CO2 20 - 29 mmol/L 17 (L)  Glucose 70 - 99 mg/dL 956 (H)  BUN 6 - 24 mg/dL 25 (H)  Creatinine 2.13 - 1.00 mg/dL 0.86 (H)  Calcium 8.7 - 10.2 mg/dL 8.9  BUN/Creatinine Ratio 9 - 23  19  eGFR >59 mL/min/1.73 49 (L)  (L): Data is abnormally low (H): Data is abnormally high  ASSESSMENT / PLAN / RECOMMENDATIONS:   1) Type 2 Diabetes Mellitus, Poorly  controlled, With neuropathic complications, S/P Left BKA and Right great toe amputation  - Most recent A1c of 10.6 %. Goal A1c < 7.0 %.     -Poorly controlled diabetes due to medication nonadherence -She has not used the Dexcom nor the OmniPod since the end of 2023, she does have pump supplies but does not recall how to put them back on, she has not been using multiple daily injections of insulin despite my advised to start back in January 2024 -She just  resumed taking her insulin 2 days ago ???? -She does understand the risk of more amputations as well as CKD and vision  loss.  - She has a hx of Pancreatitis , not a candidate for DPP 4 inhibitors and GLP-1 agonist - Has hx of DKA , would avoid SGLT2 inhibitors -Today I have prescribed Lantus pens, Humalog pens and pen needles -I have put in a new referral for retraining on the OmniPod -A new prescription for Dexcom as well as a sensor sample was provided today for Dexcom G7  MEDICATIONS:  -Take Lantus 60 units daily while of OmniPod -Take Humalog 14 units with each meal -CF : Humalog (BG goal morning-130/25) TIDQAC   EDUCATION / INSTRUCTIONS: BG monitoring instructions: Patient is instructed to check her blood sugars 3 times a day, before meals . Call [place] [facility] if: BG persistently < 70  I reviewed the Rule of 15 for the treatment of hypoglycemia in detail with the patient. Literature supplied.   2) Diabetic complications:  Eye: Does not have known diabetic retinopathy.  Neuro/ Feet: Does  have known diabetic peripheral neuropathy. Renal: Patient does not have known baseline CKD. She is not on an ACEI/ARB at present.       Follow-up in 3 months   I spent 25 minutes preparing to see the patient by review of recent labs, imaging and procedures, obtaining and reviewing separately obtained history, communicating with the patient/family or caregiver, ordering medications, tests or procedures, and documenting clinical information in the EHR including the differential Dx, treatment, and any further evaluation and other management    Signed electronically by: Lyndle Herrlich, MD  [facility]  [facility] [address] [place] [place], [place] [address] Phone: [phone] FAX: [phone]   CC: [name], MD [address] [place] [address] Phone: [phone]  Fax: [phone]    Return to Endocrinology clinic as below: Future Appointments  Date Time Provider Department Center  06/30/2023  7:30 AM Nautia Lem,  Konrad Dolores, MD [facility] None  07/20/2023 10:30 AM Daine Gip [facility] None  07/22/2023  9:20 AM Laurey Morale, MD [facility] None  07/22/2023 10:20 AM Carlis Abbott, Drema Pry, MD [facility] CPR  08/05/2023  8:00 AM Myrtie Neither, [facility] None  08/19/2023  9:00 AM Myrtie Neither, [facility] None

## 2023-06-30 NOTE — Patient Instructions (Addendum)
Lantus 60 units daily  Humalog 14 units with each meal  Humalog correctional insulin: ADD extra units on insulin to your meal-time Humalog dose if your blood sugars are higher than 155. Use the scale below to help guide you:   Blood sugar before meal Number of units to inject  Less than 155 0 unit   156 -  180 1 units  181 -  205 2 units  206 -  230 3 units  231 -  255 4 units  256 -  280 5 units  281 -  305 6 units  306 -  330 7 units  331 -  355 8 units     HOW TO TREAT LOW BLOOD SUGARS (Blood sugar LESS THAN 70 MG/DL) Please follow the RULE OF 15 for the treatment of hypoglycemia treatment (when your (blood sugars are less than 70 mg/dL)   STEP 1: Take 15 grams of carbohydrates when your blood sugar is low, which includes:  3-4 GLUCOSE TABS  OR 3-4 OZ OF JUICE OR REGULAR SODA OR ONE TUBE OF GLUCOSE GEL    STEP 2: RECHECK blood sugar in 15 MINUTES STEP 3: If your blood sugar is still low at the 15 minute recheck --> then, go back to STEP 1 and treat AGAIN with another 15 grams of carbohydrates.

## 2023-07-01 ENCOUNTER — Telehealth (HOSPITAL_COMMUNITY): Payer: Self-pay | Admitting: Licensed Clinical Social Worker

## 2023-07-01 NOTE — Telephone Encounter (Signed)
Pt had reached out to [employer] regarding medication concerns- pt not eligible to be re enrolled with paramedicine.  CSW reached out to pt to get more details and assist as able- unable to reach- left VM requesting return call  Burna Sis, LCSW Clinical Social Worker [facility] Desk#: [phone] Cell#: [phone]

## 2023-07-04 ENCOUNTER — Telehealth (HOSPITAL_COMMUNITY): Payer: Self-pay | Admitting: Licensed Clinical Social Worker

## 2023-07-04 NOTE — Telephone Encounter (Signed)
CSW called pt to follow up regarding reported concerns- unable to reach- left VM requesting return call  Burna Sis, LCSW Clinical Social Worker [facility] Desk#: [phone] Cell#: [phone]

## 2023-07-06 LAB — AMB RESULTS CONSOLE CBG: Glucose: 127

## 2023-07-07 ENCOUNTER — Telehealth (HOSPITAL_COMMUNITY): Payer: Self-pay | Admitting: Licensed Clinical Social Worker

## 2023-07-07 DIAGNOSIS — I5042 Chronic combined systolic (congestive) and diastolic (congestive) heart failure: Secondary | ICD-10-CM

## 2023-07-07 NOTE — Telephone Encounter (Signed)
H&V Care Navigation CSW Progress Note  Clinical Social Worker able to contact patient to follow up regarding medication concerns.  Pt was struggling to get all her medications and has been without some of them on and off.  Some concerns due to being unsure how to get refills in a timely matter some due to issues with insurance and billing.  Pt would like someone to help her keep on top of her medications but no longer eligible for paramedicine program.  Agreeable to Managed Medicaid referral for [facility] assistance- order placed.  Patient is participating in a Managed Medicaid Plan:  Yes  SDOH Screenings   Food Insecurity: Food Insecurity Present (05/03/2023)  Housing: High Risk (04/01/2023)  Transportation Needs: Unmet Transportation Needs (05/03/2023)  Utilities: Not At Risk (04/05/2023)  Alcohol Screen: Low Risk  (04/01/2023)  Depression (PHQ2-9): Medium Risk (06/28/2023)  Financial Resource Strain: High Risk (05/13/2023)  Physical Activity: Insufficiently Active (04/01/2023)  Social Connections: Moderately Isolated (04/01/2023)  Stress: Stress Concern Present (04/01/2023)  Tobacco Use: High Risk (06/30/2023)    Burna Sis, LCSW Clinical Social Worker [facility] Desk#: [phone] Cell#: [phone]

## 2023-07-07 NOTE — Progress Notes (Signed)
Pt given education surround high blood pressure and the risks. This nurse recommended pt go to urgent care. Pt states that she forgot to take her medication today and will go home and take it and recheck pressure at home.

## 2023-07-11 ENCOUNTER — Telehealth (HOSPITAL_COMMUNITY): Payer: Self-pay | Admitting: Licensed Clinical Social Worker

## 2023-07-11 NOTE — Telephone Encounter (Signed)
H&V Care Navigation CSW Progress Note  Clinical Social Worker spoke with pt about current struggles.  Is working very hard to pay off her past due electric bill alongside current bills but it has been difficult.  Pt owes $887.95 total and $547.30 for this month- CSW requested assistance from [place] fund with this balance as Patient Care Fund has already [employer] out assistance for this patient.  Patient is participating in a Managed Medicaid Plan:  Yes  SDOH Screenings   Food Insecurity: Food Insecurity Present (05/03/2023)  Housing: High Risk (04/01/2023)  Transportation Needs: Unmet Transportation Needs (05/03/2023)  Utilities: Not At Risk (04/05/2023)  Alcohol Screen: Low Risk  (04/01/2023)  Depression (PHQ2-9): Medium Risk (06/28/2023)  Financial Resource Strain: High Risk (07/11/2023)  Physical Activity: Insufficiently Active (04/01/2023)  Social Connections: Moderately Isolated (04/01/2023)  Stress: Stress Concern Present (04/01/2023)  Tobacco Use: High Risk (06/30/2023)    Burna Sis, LCSW Clinical Social Worker [facility] Desk#: [phone] Cell#: [phone]

## 2023-07-12 ENCOUNTER — Telehealth (HOSPITAL_COMMUNITY): Payer: Self-pay | Admitting: Licensed Clinical Social Worker

## 2023-07-12 NOTE — Telephone Encounter (Signed)
H&V Care Navigation CSW Progress Note  Clinical Social Worker completed check request to supervisor for [employer] Patient Assistance.  CSW called [employer] and made agency pledge for patients outstanding balance.    Patient is participating in a Managed Medicaid Plan:  Yes  SDOH Screenings   Food Insecurity: Food Insecurity Present (05/03/2023)  Housing: High Risk (04/01/2023)  Transportation Needs: Unmet Transportation Needs (05/03/2023)  Utilities: Not At Risk (04/05/2023)  Alcohol Screen: Low Risk  (04/01/2023)  Depression (PHQ2-9): Medium Risk (06/28/2023)  Financial Resource Strain: High Risk (07/11/2023)  Physical Activity: Insufficiently Active (04/01/2023)  Social Connections: Moderately Isolated (04/01/2023)  Stress: Stress Concern Present (04/01/2023)  Tobacco Use: High Risk (06/30/2023)    Burna Sis, LCSW Clinical Social Worker [facility] Desk#: [phone] Cell#: [phone]

## 2023-07-13 ENCOUNTER — Encounter: Payer: Medicare Other | Attending: Internal Medicine | Admitting: Nutrition

## 2023-07-13 DIAGNOSIS — E1129 Type 2 diabetes mellitus with other diabetic kidney complication: Secondary | ICD-10-CM | POA: Insufficient documentation

## 2023-07-13 DIAGNOSIS — R809 Proteinuria, unspecified: Secondary | ICD-10-CM | POA: Insufficient documentation

## 2023-07-13 DIAGNOSIS — Z794 Long term (current) use of insulin: Secondary | ICD-10-CM | POA: Insufficient documentation

## 2023-07-13 NOTE — Patient Instructions (Addendum)
When the temporary basal rate ends, put the pump back in automated mode.

## 2023-07-13 NOTE — Progress Notes (Signed)
Patient is here today with 2 months of G6 sensors and a transmitter, as well as her OmniPod pump PDM and pods. Her cell phone does not support the G6 app, but her old one does.  So, she starts the sensor on her old phone and when the sensor sends the reading to the PDM, she shuts off the  old phone and uses her new one.  She has the clarity app that is liked to [facility] She started a pod, but took her lantus this AM.  The pump was put in a 100% basal reduction mode for 24 hours.  Written directions were given to her on how to put the pump back in automated mode when the alert shows that the temp basal rated has ended.  She had no final questions
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
## 2023-07-19 ENCOUNTER — Telehealth: Payer: Self-pay

## 2023-07-19 ENCOUNTER — Other Ambulatory Visit (HOSPITAL_COMMUNITY): Payer: Self-pay

## 2023-07-19 NOTE — Telephone Encounter (Signed)
Pharmacy Patient Advocate Encounter   Received notification from CoverMyMeds that prior authorization for Dexcom G7 sensor is required/requested.   Insurance verification completed.   The patient is insured through [facility] .   Per test claim: PA required; PA started via CoverMyMeds. KEY [id number] . Waiting for clinical questions to populate.

## 2023-07-19 NOTE — Telephone Encounter (Signed)
Clinical info sent with chart notes and labs

## 2023-07-20 ENCOUNTER — Telehealth (INDEPENDENT_AMBULATORY_CARE_PROVIDER_SITE_OTHER): Payer: Medicaid Other | Admitting: Psychiatry

## 2023-07-20 ENCOUNTER — Encounter (HOSPITAL_COMMUNITY): Payer: Self-pay | Admitting: Psychiatry

## 2023-07-20 DIAGNOSIS — Z79899 Other long term (current) drug therapy: Secondary | ICD-10-CM | POA: Diagnosis not present

## 2023-07-20 DIAGNOSIS — F319 Bipolar disorder, unspecified: Secondary | ICD-10-CM

## 2023-07-20 DIAGNOSIS — F1090 Alcohol use, unspecified, uncomplicated: Secondary | ICD-10-CM

## 2023-07-20 DIAGNOSIS — F4310 Post-traumatic stress disorder, unspecified: Secondary | ICD-10-CM | POA: Diagnosis not present

## 2023-07-20 DIAGNOSIS — F1010 Alcohol abuse, uncomplicated: Secondary | ICD-10-CM

## 2023-07-20 DIAGNOSIS — Z5181 Encounter for therapeutic drug level monitoring: Secondary | ICD-10-CM | POA: Diagnosis not present

## 2023-07-20 MED ORDER — ARIPIPRAZOLE 5 MG PO TABS: 5.0000 mg | ORAL_TABLET | Freq: Every day | ORAL | 2 refills | Status: DC

## 2023-07-20 NOTE — Patient Instructions (Signed)
Thank you for attending your appointment today.  -- We did not make any medication changes today. Please continue medications as prescribed.  Please do not make any changes to medications without first discussing with your provider. If you are experiencing a psychiatric emergency, please call 911 or present to your nearest emergency department. Additional crisis, medication management, and therapy resources are included below.  [facility]  [address], [place], [place] [address] [phone] WALK-IN URGENT CARE 24/7 FOR ANYONE [address], [place], [place]  [phone] Fax: [phone] [email or web address] *Interpreters available *Accepts all insurance and uninsured for Urgent Care needs *Accepts Medicaid and uninsured for outpatient treatment (below)      ONLY FOR [place] Residents  Below:    Outpatient New Patient Assessment/Therapy Walk-ins:        Monday -Thursday 8am until slots are full.        Every Friday 1pm-4pm  (first come, first served)                   New Patient Psychiatry/Medication Management        Monday-Friday 8am-11am (first come, first served)               For all walk-ins we ask that you arrive by 7:15am, because patients will be seen in the order of arrival.   

## 2023-07-22 ENCOUNTER — Encounter: Payer: Self-pay | Admitting: Physical Medicine and Rehabilitation

## 2023-07-22 ENCOUNTER — Ambulatory Visit (HOSPITAL_COMMUNITY)
Admission: RE | Admit: 2023-07-22 | Discharge: 2023-07-22 | Disposition: A | Discharge status: Home / Self Care | Payer: Medicare Other | Source: Ambulatory Visit | Attending: Cardiology | Admitting: Cardiology

## 2023-07-22 ENCOUNTER — Encounter
Payer: Medicare Other | Attending: Physical Medicine and Rehabilitation | Admitting: Physical Medicine and Rehabilitation

## 2023-07-22 ENCOUNTER — Encounter (HOSPITAL_COMMUNITY): Payer: Self-pay | Admitting: Cardiology

## 2023-07-22 VITALS — BP 130/80 | HR 83 | Wt 219.6 lb

## 2023-07-22 VITALS — BP 94/66 | HR 81 | Ht 64.0 in | Wt 218.0 lb

## 2023-07-22 DIAGNOSIS — N943 Premenstrual tension syndrome: Secondary | ICD-10-CM | POA: Diagnosis not present

## 2023-07-22 DIAGNOSIS — Z8719 Personal history of other diseases of the digestive system: Secondary | ICD-10-CM | POA: Diagnosis not present

## 2023-07-22 DIAGNOSIS — E1165 Type 2 diabetes mellitus with hyperglycemia: Secondary | ICD-10-CM | POA: Diagnosis not present

## 2023-07-22 DIAGNOSIS — R0602 Shortness of breath: Secondary | ICD-10-CM | POA: Insufficient documentation

## 2023-07-22 DIAGNOSIS — Z6837 Body mass index (BMI) 37.0-37.9, adult: Secondary | ICD-10-CM | POA: Insufficient documentation

## 2023-07-22 DIAGNOSIS — Z7984 Long term (current) use of oral hypoglycemic drugs: Secondary | ICD-10-CM | POA: Insufficient documentation

## 2023-07-22 DIAGNOSIS — R600 Localized edema: Secondary | ICD-10-CM | POA: Diagnosis not present

## 2023-07-22 DIAGNOSIS — I130 Hypertensive heart and chronic kidney disease with heart failure and stage 1 through stage 4 chronic kidney disease, or unspecified chronic kidney disease: Secondary | ICD-10-CM | POA: Insufficient documentation

## 2023-07-22 DIAGNOSIS — E854 Organ-limited amyloidosis: Secondary | ICD-10-CM

## 2023-07-22 DIAGNOSIS — N183 Chronic kidney disease, stage 3 unspecified: Secondary | ICD-10-CM | POA: Diagnosis not present

## 2023-07-22 DIAGNOSIS — Z794 Long term (current) use of insulin: Secondary | ICD-10-CM | POA: Diagnosis not present

## 2023-07-22 DIAGNOSIS — I43 Cardiomyopathy in diseases classified elsewhere: Secondary | ICD-10-CM

## 2023-07-22 DIAGNOSIS — E1122 Type 2 diabetes mellitus with diabetic chronic kidney disease: Secondary | ICD-10-CM | POA: Diagnosis not present

## 2023-07-22 DIAGNOSIS — E785 Hyperlipidemia, unspecified: Secondary | ICD-10-CM | POA: Diagnosis not present

## 2023-07-22 DIAGNOSIS — R059 Cough, unspecified: Secondary | ICD-10-CM | POA: Diagnosis not present

## 2023-07-22 DIAGNOSIS — E669 Obesity, unspecified: Secondary | ICD-10-CM | POA: Insufficient documentation

## 2023-07-22 DIAGNOSIS — Z79899 Other long term (current) drug therapy: Secondary | ICD-10-CM | POA: Diagnosis not present

## 2023-07-22 DIAGNOSIS — I5022 Chronic systolic (congestive) heart failure: Secondary | ICD-10-CM | POA: Diagnosis not present

## 2023-07-22 DIAGNOSIS — I5042 Chronic combined systolic (congestive) and diastolic (congestive) heart failure: Secondary | ICD-10-CM

## 2023-07-22 DIAGNOSIS — G546 Phantom limb syndrome with pain: Secondary | ICD-10-CM | POA: Diagnosis not present

## 2023-07-22 DIAGNOSIS — I428 Other cardiomyopathies: Secondary | ICD-10-CM | POA: Diagnosis not present

## 2023-07-22 DIAGNOSIS — F17210 Nicotine dependence, cigarettes, uncomplicated: Secondary | ICD-10-CM | POA: Insufficient documentation

## 2023-07-22 DIAGNOSIS — Z8679 Personal history of other diseases of the circulatory system: Secondary | ICD-10-CM | POA: Diagnosis not present

## 2023-07-22 LAB — BASIC METABOLIC PANEL
Anion gap: 12 (ref 5–15)
BUN: 28 mg/dL — ABNORMAL HIGH (ref 6–20)
CO2: 16 mmol/L — ABNORMAL LOW (ref 22–32)
Calcium: 8.5 mg/dL — ABNORMAL LOW (ref 8.9–10.3)
Chloride: 104 mmol/L (ref 98–111)
Creatinine, Ser: 1.58 mg/dL — ABNORMAL HIGH (ref 0.44–1.00)
GFR, Estimated: 40 mL/min — ABNORMAL LOW (ref >60)
Glucose, Bld: 238 mg/dL — ABNORMAL HIGH (ref 70–99)
Potassium: 4.7 mmol/L (ref 3.5–5.1)
Sodium: 132 mmol/L — ABNORMAL LOW (ref 135–145)

## 2023-07-22 LAB — BRAIN NATRIURETIC PEPTIDE: B Natriuretic Peptide: 281.3 pg/mL — ABNORMAL HIGH (ref 0.0–100.0)

## 2023-07-22 LAB — LIPID PANEL
Cholesterol: 103 mg/dL (ref 0–200)
HDL: 40 mg/dL — ABNORMAL LOW (ref >40)
LDL Cholesterol: 39 mg/dL (ref 0–99)
Total CHOL/HDL Ratio: 2.6 RATIO
Triglycerides: 121 mg/dL (ref <150)
VLDL: 24 mg/dL (ref 0–40)

## 2023-07-22 MED ORDER — FUROSEMIDE 20 MG PO TABS: ORAL_TABLET | ORAL | 3 refills | Status: DC

## 2023-07-22 NOTE — Addendum Note (Signed)
Addended by: Horton Chin on: 07/22/2023 10:32 AM   Modules accepted: Orders

## 2023-07-22 NOTE — Patient Instructions (Signed)
saffron

## 2023-07-22 NOTE — Patient Instructions (Signed)
CHANGE Lasix to 40 mg in the morning and 20 mg in the evening.  Labs done today, your results will be available in MyChart, we will contact you for abnormal readings.  Repeat blood work in 10 days.  You have been referred to the HEART CARE PHARMACY TEAM for [place]. They will cal you to arrange your appointment.  PLEASE USE YOUR HOME SLEEP STUDY ASAP.  Your physician recommends that you schedule a follow-up appointment in: 1 month  If you have any questions or concerns before your next appointment please send [place] a message through [place] or call our office at [phone].    TO LEAVE A MESSAGE FOR THE NURSE SELECT OPTION 2, PLEASE LEAVE A MESSAGE INCLUDING: YOUR NAME DATE OF BIRTH CALL BACK NUMBER REASON FOR CALL**this is important as we prioritize the call backs  YOU WILL RECEIVE A CALL BACK THE SAME DAY AS LONG AS YOU CALL BEFORE 4:00 PM  At the [facility], you and your health needs are our priority. As part of our continuing mission to provide you with exceptional heart care, we have created designated Provider Care Teams. These Care Teams include your primary Cardiologist (physician) and Advanced Practice Providers (APPs- Physician Assistants and Nurse Practitioners) who all work together to provide you with the care you need, when you need it.   You may see any of the following providers on your designated Care Team at your next follow up: Dr Arvilla Meres Dr Marca Ancona Dr. Marcos Eke, NP Robbie Lis, [place] [facility] [employer], [place] Brynda Peon, NP Karle Plumber, PharmD   Please be sure to bring in all your medications bottles to every appointment.    Thank you for choosing [facility]

## 2023-07-22 NOTE — Progress Notes (Addendum)
Subjective:    Patient ID: Sarah Long, female    DOB: 25-May-1971, 52 y.o.   MRN: [id number]  HPI Sarah Long is a 52 year old woman who presents for follow-up left BKA phantom limb pain, menstrual pain.   1) Type 2 DM -she has an appointment with an endocrinologist -has a dexcom and omnipod to given her 2U insulin every hour -CBGs have been very well controlled.  -she has tingling and numbness in her right foot -she is getting phantom limb pain in left leg.  -she has follow-up with Dr. Lajoyce Corners  and PCP -she is willing to try nerve medications -she has neuropathic pain throughout her right lower extremity -does not require any refills today -she still has neuropathy in her other foot.   2) Constipation: -she does not lie the Miralax -she is worried about being constipated wit the iron.   3) Left BKA phantom limb pain -has started again -Qutenza was very helpful in the past and resulted I complete relief but it burned severely when she went home so she prefers another option -tolerating prosthesis well  4) Right lower extremity edema -compression garments ordered by Dr. Lajoyce Corners -she does not like how much the lasix makes her urinate -does not require any refills today -improved with lasix  5) Menarche -returned and can be painful -like clockwark the week before she started feeling pain week before   6) HTN -BP up to 151/84 today  Pain Inventory Average Pain 8 Pain Right Now 9 My pain is burning, stabbing, and aching  In the last 24 hours, has pain interfered with the following? General activity 7 Relation with others 8 Enjoyment of life 7 What TIME of day is your pain at its worst? Morning daytime evening night Sleep (in general) Fair  Pain is worse with: sitting, inactivity, and some activites bending Pain improves with: medication Relief from Meds: 5     Family History  Problem Relation Age of Onset   Hypertension Mother    Diabetes Mother    Heart  disease Mother    Hypertension Father    Diabetes Father    Heart disease Father    Stroke Maternal Grandfather    Depression Maternal Grandmother    Suicidality Maternal Grandmother    Schizophrenia Paternal Grandmother    Other Neg Hx    Breast cancer Neg Hx    Social History   Socioeconomic History   Marital status: Significant Other    Spouse name: Not on file   Number of children: Not on file   Years of education: Not on file   Highest education level: Some college, no degree  Occupational History   Not on file  Tobacco Use   Smoking status: Every Day    Current packs/day: 0.00    Average packs/day: 0.5 packs/day for 20.0 years (10.0 ttl pk-yrs)    Types: Cigarettes    Start date: 07/01/2001    Last attempt to quit: 07/01/2021    Years since quitting: 2.0   Smokeless tobacco: Never   Tobacco comments:    1. Centrix to stop smoking   Vaping Use   Vaping status: Never Used  Substance and Sexual Activity   Alcohol use: Yes    Comment: 2-5 drinks in a sitting about once every 2 weeks; history of excessive use   Drug use: No   Sexual activity: Yes    Partners: Female, Female    Birth control/protection: None  Other Topics Concern  Not on file  Social History Narrative   Not on file   Social Determinants of Health   Financial Resource Strain: High Risk (07/12/2023)   Overall Financial Resource Strain (CARDIA)    Difficulty of Paying Living Expenses: Very hard  Food Insecurity: Food Insecurity Present (05/03/2023)   Hunger Vital Sign    Worried About Running Out of Food in the Last Year: Sometimes true    Ran Out of Food in the Last Year: Sometimes true  Transportation Needs: Unmet Transportation Needs (05/03/2023)   PRAPARE - Administrator, Civil Service (Medical): Yes    Lack of Transportation (Non-Medical): Yes  Physical Activity: Insufficiently Active (04/01/2023)   Exercise Vital Sign    Days of Exercise per Week: 3 days    Minutes of Exercise per  Session: 20 min  Stress: Stress Concern Present (04/01/2023)   [employer] of Occupational Health - Occupational Stress Questionnaire    Feeling of Stress : Very much  Social Connections: Moderately Isolated (04/01/2023)   Social Connection and Isolation Panel [NHANES]    Frequency of Communication with Friends and Family: Once a week    Frequency of Social Gatherings with Friends and Family: Once a week    Attends Religious Services: More than 4 times per year    Active Member of Clubs or Organizations: Yes    Attends Banker Meetings: More than 4 times per year    Marital Status: Divorced   Past Surgical History:  Procedure Laterality Date   ABDOMINAL AORTOGRAM W/LOWER EXTREMITY N/A 06/17/2021   Procedure: ABDOMINAL AORTOGRAM W/LOWER EXTREMITY;  Surgeon: Iran Ouch, MD;  Location: [facility];  Service: Cardiovascular;  Laterality: N/A;   AMPUTATION Left 06/27/2021   Procedure: AMPUTATION BELOW KNEE;  Surgeon: Nadara Mustard, MD;  Location: [facility] OR;  Service: Orthopedics;  Laterality: Left;   AMPUTATION Right 09/04/2021   Procedure: RIGHT GREAT TOE AMPUTATION;  Surgeon: Nadara Mustard, MD;  Location: [facility] OR;  Service: Orthopedics;  Laterality: Right;   CESAREAN SECTION  1991   LEFT HEART CATH AND CORONARY ANGIOGRAPHY N/A 02/10/2020   Procedure: LEFT HEART CATH AND CORONARY ANGIOGRAPHY;  Surgeon: Lennette Bihari, MD;  Location: [facility];  Service: Cardiovascular;  Laterality: N/A;   PACEMAKER IMPLANT N/A 02/13/2020   Procedure: PACEMAKER IMPLANT;  Surgeon: Regan Lemming, MD;  Location: [facility];  Service: Cardiovascular;  Laterality: N/A;   TEMPORARY PACEMAKER N/A 02/10/2020   Procedure: TEMPORARY PACEMAKER;  Surgeon: Lennette Bihari, MD;  Location: [facility];  Service: Cardiovascular;  Laterality: N/A;   Past Medical History:  Diagnosis Date   Abnormal Pap smear 1998   Abnormal vaginal bleeding 12/19/2011   Acute urinary  retention 06/26/2021   Arthritis    Bacterial infection    Bipolar 1 disorder (HCC)    Blister of second toe of left foot 11/21/2016   Candida vaginitis 07/2007   Depression    recently added wellbutrin-has not taken yet for bipolar   Diabetes in pregnancy    Diabetes mellitus    nph 20U qam and qpm, regular with meals   Diabetic ketoacidosis without coma associated with type 2 diabetes mellitus (HCC)    Fibroid    Galactorrhea of right breast 2008   GERD (gastroesophageal reflux disease)    H/O amenorrhea 07/2007   H/O dizziness 10/14/2011   H/O dysmenorrhea 2010   H/O menorrhagia 10/14/2011   H/O varicella  Headache(784.0)    Heavy vaginal bleeding due to contraceptive injection use 10/12/2011   Depo Provera   Herpes    Homelessness 11/19/2021   HSV-2 infection 01/03/2009   Hx: UTI (urinary tract infection) 2009   Hypertension    on aldomet   Hypoalbuminemia due to protein-calorie malnutrition (HCC) 06/26/2021   Increased BMI 2010   Irregular uterine bleeding 04/04/2012   Pt has mirena    Obesity 10/14/2011   Oligomenorrhea 07/2007   Osteomyelitis of great toe of right foot (HCC) 03/22/2022   Pelvic pain in female    Presence of permanent cardiac pacemaker    Preterm labor    PTSD (post-traumatic stress disorder)    Trichomonas    Yeast infection    There were no vitals taken for this visit.  Opioid Risk Score:   Fall Risk Score:  `1  Depression screen [facility] 2/9     06/28/2023    9:22 AM 05/13/2023   12:54 PM 04/27/2023    8:51 AM 03/15/2023    9:30 AM 01/13/2023   10:07 AM 11/11/2022    4:09 PM 09/07/2022    9:46 AM  Depression screen PHQ 2/9  Decreased Interest 0  3 2 2  0 0  Down, Depressed, Hopeless 0  3 1 2 1  0  PHQ - 2 Score 0  6 3 4 1  0  Altered sleeping 3  3 3 3  0   Tired, decreased energy 3  3 1 3  0   Change in appetite 0  3 0 2 0   Feeling bad or failure about yourself  0  2 0 0 0   Trouble concentrating 1  2 0 1 0   Moving slowly or  fidgety/restless 0  0 0 0 0   Suicidal thoughts 0  0 0 0 0   PHQ-9 Score 7  19 7 13 1    Difficult doing work/chores      Somewhat difficult      Information is confidential and restricted. Go to Review Flowsheets to unlock data.      Review of Systems  Constitutional: Negative.   HENT: Negative.    Eyes: Negative.   Respiratory: Negative.    Cardiovascular: Negative.   Gastrointestinal: Negative.   Endocrine: Negative.   Genitourinary: Negative.   Musculoskeletal:        Pain in left leg  Skin: Negative.   Allergic/Immunologic: Negative.   Neurological:  Positive for weakness and numbness.  Hematological: Negative.   Psychiatric/Behavioral:  Positive for dysphoric mood. The patient is nervous/anxious.   All other systems reviewed and are negative.      Objective:   Physical Exam Gen: no distress, normal appearing, weight 215 lbs, BMI 36.90, BMI 169/108 HEENT: oral mucosa pink and moist, NCAT Cardio: Reg rate Chest: normal effort, normal rate of breathing Abd: soft, non-distended Ext: no edema Psych: pleasant, normal affect Skin: intact Neuro: Alert and oriented x3 Musculoskeletal: Left BKA healed well, ambulating with prosthetic well. Amputated digits of right foot    Assessment & Plan:  Mrs. Sarah Long is a 52 year old woman who presents for hospital follow-up after CIR admission for left BKA  1) L BKA with phantom limb pain -continue follow-up with Dr. Lajoyce Corners, healing well D/c gabapentin -Discussed Qutenza as an option for neuropathic pain control. Discussed that this is a capsaicin patch, stronger than capsaicin cream. Discussed that it is currently approved for diabetic peripheral neuropathy and post-herpetic neuralgia, but that it has also shown  benefit in treating other forms of neuropathy. Provided patient with link to site to learn more about the patch: https://www.clark.biz/. Discussed that the patch would be placed in office and benefits usually last 3 months.  Discussed that unintended exposure to capsaicin can cause severe irritation of eyes, mucous membranes, respiratory tract, and skin, but that Qutenza is a local treatment and does not have the systemic side effects of other nerve medications. Discussed that there may be pain, itching, erythema, and decreased sensory function associated with the application of Qutenza. Side effects usually subside within 1 week. A cold pack of analgesic medications can help with these side effects. Blood pressure can also be increased due to pain associated with administration of the patch.  -will complete social security forms, asked that these be resent -discussed her current challenges at work and that she is currently working three hours per day. Discussed her difficulty doing stairs, shortness of breath -Provided with a pain relief journal and discussed that it contains foods and lifestyle tips to naturally help to improve pain. Discussed that these lifestyle strategies are also very good for health unlike some medications which can have negative side effects. Discussed that the act of keeping a journal can be therapeutic and helpful to realize patterns what helps to trigger and alleviate pain.   Prescribed Zynex Nexwave -discussed that she cannot take ibuprofen due to her heart disease -discussed that she can try increasing tylenol to 1,000mg  TID  2) Type 2 DM with peripheral neuropathy- improved -referred to PT for mirror therapy -improved, d/c qutenza.  -will look into medically tailored meals for her.  -Recommended 1 glass water with 1 TB apple cider vinegar before meals to reduce CBG spike, has additional health benefits, drink with straw to protect enamel.  -discussed her difficulty getting food from her family.  Discussed that honey has been shown to help diabetes, but that it does have fructose in it.    2) Anemia -discussed what it means to be anemic -discussed sources of iron rih fods.    3)Constipation:  -Provided list of following foods that help with constipation and highlighted a few: 1) prunes- contain high amounts of fiber.  2) apples- has a form of dietary fiber called pectin that accelerates stool movement and increases beneficial gut bacteria 3) pears- in addition to fiber, also high in fructose and sorbitol which have laxative effect 4) figs- contain an enzyme ficin which helps to speed colonic transit 5) kiwis- contain an enzyme actinidin that improves gut motility and reduces constipation 6) oranges- rich in pectin (like apples) 7) grapefruits- contain a flavanol naringenin which has a laxative effect 8) vegetables- rich in fiber and also great sources of folate, vitamin C, and K 9) artichoke- high in inulin, prebiotic great for the microbiome 10) chicory- increases stool frequency and softness (can be added to coffee) 11) rhubarb- laxative effect 12) sweet potato- high fiber 13) beans, peas, and lentils- contain both soluble and insoluble fiber 14) chia seeds- improves intestinal health and gut flora 15) flaxseeds- laxative effect 16) whole grain rye bread- high in fiber 17) oat bran- high in soluble and insoluble fiber 18) kefir- softens stools -recommended to try at least one of these foods every day.  -drink 6-8 glasses of water per day -walk regularly, especially after meals.   4) HTN: -BP is 151/84 today.  -Advised checking BP daily at home and logging results to bring into follow-up appointment with PCP and myself. -Reviewed BP meds today.  -  Advised regarding healthy foods that can help lower blood pressure and provided with a list: 1) citrus foods- high in vitamins and minerals 2) salmon and other fatty fish - reduces inflammation and oxylipins 3) swiss chard (leafy green)- high level of nitrates 4) pumpkin seeds- one of the best natural sources of magnesium 5) Beans and lentils- high in fiber, magnesium, and potassium 6) Berries- high in  flavonoids 7) Amaranth (whole grain, can be cooked similarly to rice and oats)- high in magnesium and fiber 8) Pistachios- even more effective at reducing BP than other nuts 9) Carrots- high in phenolic compounds that relax blood vessels and reduce inflammation 10) Celery- contain phthalides that relax tissues of arterial walls 11) Tomatoes- can also improve cholesterol and reduce risk of heart disease 12) Broccoli- good source of magnesium, calcium, and potassium 13) Greek yogurt: high in potassium and calcium 14) Herbs and spices: Celery seed, cilantro, saffron, lemongrass, black cumin, ginseng, cinnamon, cardamom, sweet basil, and ginger 15) Chia and flax seeds- also help to lower cholesterol and blood sugar 16) Beets- high levels of nitrates that relax blood vessels  17) spinach and bananas- high in potassium  -Provided lise of supplements that can help with hypertension:  1) magnesium: one high quality brand is Bioptemizers since it contains all 7 types of magnesium, otherwise over the counter magnesium gluconate 400mg  is a good option 2) B vitamins 3) vitamin D 4) potassium 5) CoQ10 6) L-arginine 7) Vitamin C 8) Beetroot -Educated that goal BP is 120/80. -Made goal to incorporate some of the above foods into diet.    5) Allergies -recommended bee pollen   6) Obesity: -Educated that current weight is 215 lbs and current BMI is 36.90 -Educated regarding health benefits of weight loss- for pain, general health, chronic disease prevention, immune health, mental health.  -Will monitor weight every visit.  -Consider Roobois tea daily.  -Discussed the benefits of intermittent fasting. -Discussed foods that can assist in weight loss: 1) leafy greens- high in fiber and nutrients 2) dark chocolate- improves metabolism (if prefer sweetened, best to sweeten with honey instead of sugar).  3) cruciferous vegetables- high in fiber and protein 4) full fat yogurt: high in healthy fat,  protein, calcium, and probiotics 5) apples- high in a variety of phytochemicals 6) nuts- high in fiber and protein that increase feelings of fullness 7) grapefruit: rich in nutrients, antioxidants, and fiber (not to be taken with anticoagulation) 8) beans- high in protein and fiber 9) salmon- has high quality protein and healthy fats 10) green tea- rich in polyphenols 11) eggs- rich in choline and vitamin D 12) tuna- high protein, boosts metabolism 13) avocado- decreases visceral abdominal fat 14) chicken (pasture raised): high in protein and iron 15) blueberries- reduce abdominal fat and cholesterol 16) whole grains- decreases calories retained during digestion, speeds metabolism 17) chia seeds- curb appetite 18) chilies- increases fat metabolism  -Discussed supplements that can be used:  1) Metatrim 400mg  BID 30 minutes before breakfast and dinner  2) Sphaeranthus indicus and Garcinia mangostana (combinations of these and #1 can be found in capsicum and zychrome  3) green coffee bean extract 400mg  twice per day or Irvingia (african mango) 150 to 300mg  twice per day.  7) Bilateral lower extremity edema 2/2 CHF: -continue Farxiga and Lasix -discussed that she has been cleared my Dr. Sherlie Ban to use Nexwave  8) Premenstrual pain: -discussed using saffron daily.  9) Morbid obesity: -discussed that she eats a lot of fast food currently -  discussed that she has a girlfriend that does charcuterie  -continue tylenol and ibuprofen prn

## 2023-07-23 NOTE — Progress Notes (Signed)
PCP: [name], MD Endocrinology: Dr. Lonzo Cloud EP: Dr. Elberta Fortis Cardiology: Dr. Shirlee Latch  52 y.o. with history of type 2 diabetes, HTN, hyperlipidemia, complete heart block with PPM, and chronic diastolic CHF was referred by Dr. Delford Field for evaluation of CHF.  Patient has had diabetes since age 45 and has had HTN for at least 5 years.  She is a smoker and has a history of prior ETOH abuse with ETOH pancreatitis. In 3/21, she was admitted with DKA.  She was found to have associated complete heart block that did not resolve, she had St Jude PPM placed.  She had an echocardiogram showing EF 40-45% with severe LV hypertrophy.  She did not have a cardiac MRI prior to PPM placement.  Cath in 3/21 showed no significant CAD (nonischemic cardiomyopathy).   Last seen in 5/21, doing well but not taking any cardiac meds. Entresto started and started work up for cardiac sarcoidosis and cardiac amyloidosis.  Echo (12/22): EF 70-75%, severe LVH, normal RV, mild to moderate MR.  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 She has a functioning orthotic.   She had follow up with EP 1/23, PPM functioning appropriately. Daily lasix restarted and cMRI arranged.  cMRI (2/23) with LVEF 68%, severe LVH, ECV 54%, no LVOT gradient, poor nulling with uninterpretable delayed enhancement.   Follow up 5/23, NYHA II symptoms, volume overloaded. Lasix increase to 40 daily and genetic testing arranged to evaluate for TTR amyloidosis and hypertrophic cardiomyopathies. 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  Of note, "heart problems" run in family with mother and maternal grandparents having CHF.  Alpha galactosidase activity was low at 20.1.  She was seen for genetics counseling by Dr. 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   Echo 2/24 EF 65-70%, severe LVH, normal RV size and systolic function, GLS -6.4% (very abnormal), IVC normal.   Zio monitor in 3/24  showed 5 short SVT runs and rare PVCs.   She has been seen by Dr. Austin Miles in the Fabry's clinic at [facility]   Today she returns for HF follow up. She continues to work as a Lawyer.  Still smoking 1-2 cigarettes/day but cutting back with Chantix.  She has an occasional dry cough.  She is short of breath walking up stairs or walking long distances.  No chest pain.  She has had more orthopnea recently, sleeping on 4 pillows.  Weight up 6 lbs.   ECG (personally reviewed): NSR, RV paced  St Jude device interrogation (personally reviewed): >99% RV paced, no AF.   Labs (4/21): K 4, creatinine 0.87 Labs (2/23): K 4.4, creatinine 1.12 Labs (5/23): K 5.2, creatinine 1.14, LDL 53, HDL 42 Labs (8/23): K 4.4, creatinine 1.48 Labs (10/23): K 5.1, creatinine 1.34 Labs (12/23): K 5.1, creatinine 1.06 Labs (2/24): LDL 88, K 4.2, creatinine 5.62 Labs (4/24): K 5.0, creatinine 1.30, LDL 73 Labs (7/24): K 4.9, creatinine 1.33  PMH: 1. Type 2 diabetes: Diagnosed at age 86.  2. HTN 3. Hyperlipidemia 4. Active smoker 5. H/o ETOH pancreatitis 6. Bipolar disorder 7. Complete heart block: 3/21, associate with DKA episode.  Has St Jude PPM.  8. Chronic systolic CHF: Nonischemic cardiomyopathy.  Echo (3/21) with EF 40-45%, severe LVH, mild MR, normal RV.  - LHC (3/21) normal coronaries.  - PYP scan (6/21): Negative for TTR cardiac amyloidosis.  - cMRI (2/23): LVEF 68%, severe LVH, ECV 54%, no LVOT gradient, poor nulling  with uninterpretable delayed enhancement.  - CTA chest (7/23): No evidence for pulmonary sarcoidosis, no ILD.  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  - Alpha-galactosidase activity testing low (20.1) - Echo (2/24): EF 65-70%, severe LVH, normal RV size and systolic function, GLS -6.4% (very abnormal), IVC normal.  9. S/p left BKA (7/22). Has Phantom Limb pain. 10. CKD stage 3: Diabetic nephropathy, ?related to Fabry disease.  11. Zio monitor  (3/24): 5 short SVT runs and rare PVCs.  SH: Divorced, un-employed, prior heavy ETOH now rare, smokes 1-2 cigarettes daily.  FH: Grandfather with CHF. No known sarcoidosis or amyloidosis.   ROS: All systems reviewed and negative except as per HPI.   Current Outpatient Medications  Medication Sig Dispense Refill   ARIPiprazole (ABILIFY) 5 MG tablet Take 1 tablet (5 mg total) by mouth daily. 30 tablet 2   atorvastatin (LIPITOR) 20 MG tablet Take 1 tablet (20 mg total) by mouth daily. 90 tablet 3   carvedilol (COREG) 12.5 MG tablet Take 1 tablet (12.5 mg total) by mouth 2 (two) times daily with a meal. 120 tablet 1   clotrimazole-betamethasone (LOTRISONE) cream Apply 1 Application topically daily. 30 g 0   Continuous Glucose Sensor (DEXCOM G7 SENSOR) MISC 1 Device by Does not apply route as directed. 9 each 3   dapagliflozin propanediol (FARXIGA) 10 MG TABS tablet Take 1 tablet (10 mg total) by mouth daily before breakfast. 30 tablet 11   hydrALAZINE (APRESOLINE) 50 MG tablet Take one three times daily 180 tablet 1   Insulin Disposable Pump (OMNIPOD 5 G7 INTRO, GEN 5,) KIT 1 Device by Does not apply route every other day. 1 kit 0   Insulin Disposable Pump (OMNIPOD 5 G7 PODS, GEN 5,) MISC 1 Device by Does not apply route every other day. 45 each 3   Insulin Pen Needle 31G X 5 MM MISC Use as directed in the morning, at noon, in the evening, and at bedtime. 400 each 3   isosorbide mononitrate (ISMO) 20 MG tablet Take 1 tablet (20 mg total) by mouth daily. 90 tablet 3   latanoprost (XALATAN) 0.005 % ophthalmic solution Place 1 drop into both eyes at bedtime.     valsartan (DIOVAN) 320 MG tablet Take 1 tablet (320 mg total) by mouth daily. 90 tablet 3   varenicline (CHANTIX CONTINUING MONTH PAK) 1 MG tablet Take 1 tablet (1 mg total) by mouth 2 (two) times daily. 60 tablet 1   furosemide (LASIX) 20 MG tablet Take 2 tablets (40 mg total) by mouth every morning AND 1 tablet (20 mg total) every evening.  200 tablet 3   insulin glargine (LANTUS SOLOSTAR) 100 UNIT/ML Solostar Pen Inject 60 Units into the skin daily. 60 mL 3   No current facility-administered medications for this encounter.   Wt Readings from Last 3 Encounters:  07/22/23 99.6 kg (219 lb 9.6 oz)  07/22/23 98.9 kg (218 lb)  06/30/23 99.3 kg (219 lb)   BP 130/80   Pulse 83   Wt 99.6 kg (219 lb 9.6 oz)   SpO2 98%   BMI 37.69 kg/m  General: NAD Neck: Thick, JVP 8 cm, no thyromegaly or thyroid nodule.  Lungs: Clear to auscultation bilaterally with normal respiratory effort. CV: Nondisplaced PMI.  Heart regular S1/S2, no S3/S4, 2/6 early SEM RUSB.  No peripheral edema.  No carotid bruit.  Normal pedal pulses.  Abdomen: Soft, nontender, no hepatosplenomegaly, no distention.  Skin: Intact without  lesions or rashes.  Neurologic: Alert and oriented x 3.  Psych: Normal affect. Extremities: No clubbing or cyanosis.  HEENT: Normal.   Assessment/Plan: 1. Chronic systolic CHF: Echo in 3/21 with EF 40-45%, severe LV hypertrophy.  This was found in association with complete heart block.  Nonischemic cardiomyopathy, cath in 3/21 showed no significant CAD.  PYP scan (6/21) not suggestive of TTR amyloidosis. cMRI (2/23) showed LVEF 68%, severe LVH, ECV 54%, no LVOT gradient, poor nulling with uninterpretable delayed enhancement. CTA chest did not show evidence for pulmonary sarcoidosis.  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  There is wide phenotypic variation in female Fabry's heterozygotes, but I suspect that the cardiomyopathy with LVH and h/o complete heart block in this situation is most likely due to Fabry's disease. Echo 2/24 showed EF 65-70%, severe LVH, normal RV size and systolic function, GLS -6.4% (very abnormal), IVC normal.  NYHA class II-III symptoms, mild volume overload on exam with weight up.  - She has seen Dr. Austin Miles at  [facility] for Fabry's evaluation. They are working on starting her on migalastat, an alpha-galactosidase A pharmacological chaperone.  Her alpha-gal variant leads to abnormal folding/less stable enzyme amenable to treatment with migalastat which should stabilize with enzyme and direct it to lysosome.   - Continue hydralazine 25 mg tid + Imdur 20 mg daily.  - Continue Farxiga 10 mg daily.  - Increase Lasix to 40 qam/20 qpm.  BMET/BNP today and BMET in 10 days.  - Continue Coreg 12.5 mg bid. - Continue valsartan 320 mg daily.  2. HTN: BP controlled.  3. Hyperlipidemia: Check lipids on atorvastatin.  4. Smoking: Smoking 1-2 cigs/day. Discussed cessation. - Continue Chantix. 5. Type 2 diabetes: Followed by Endocrinology. She is on insulin. 6. Complete heart block: She has a St Jude PPM and is pacing her RV > 99% of the time.  This is likely related to Fabry's disease.  7. Obesity: Body mass index is 37.69 kg/m. - Would avoid GLP-1 agonist with history of pancreatitis.   Follow up 1 month with APP  Marca Ancona  07/23/2023

## 2023-07-26 NOTE — Telephone Encounter (Signed)
Pharmacy Patient Advocate Encounter  Received notification from [facility] that Prior Authorization for Dexcom G7 sensor has been APPROVED until 01/19/2024   PA #/Case ID/Reference #: [id number]

## 2023-08-01 ENCOUNTER — Ambulatory Visit (HOSPITAL_COMMUNITY)
Admission: RE | Admit: 2023-08-01 | Discharge: 2023-08-01 | Disposition: A | Discharge status: Home / Self Care | Payer: Medicare Other | Source: Ambulatory Visit | Attending: Cardiology | Admitting: Cardiology

## 2023-08-01 DIAGNOSIS — E854 Organ-limited amyloidosis: Secondary | ICD-10-CM | POA: Diagnosis not present

## 2023-08-01 DIAGNOSIS — I43 Cardiomyopathy in diseases classified elsewhere: Secondary | ICD-10-CM | POA: Diagnosis not present

## 2023-08-01 LAB — BASIC METABOLIC PANEL
Anion gap: 8 (ref 5–15)
BUN: 48 mg/dL — ABNORMAL HIGH (ref 6–20)
CO2: 15 mmol/L — ABNORMAL LOW (ref 22–32)
Calcium: 9 mg/dL (ref 8.9–10.3)
Chloride: 111 mmol/L (ref 98–111)
Creatinine, Ser: 2.16 mg/dL — ABNORMAL HIGH (ref 0.44–1.00)
GFR, Estimated: 27 mL/min — ABNORMAL LOW (ref >60)
Glucose, Bld: 210 mg/dL — ABNORMAL HIGH (ref 70–99)
Potassium: 4.8 mmol/L (ref 3.5–5.1)
Sodium: 134 mmol/L — ABNORMAL LOW (ref 135–145)

## 2023-08-03 ENCOUNTER — Telehealth (HOSPITAL_COMMUNITY): Payer: Self-pay

## 2023-08-03 DIAGNOSIS — I5022 Chronic systolic (congestive) heart failure: Secondary | ICD-10-CM

## 2023-08-03 MED ORDER — FUROSEMIDE 20 MG PO TABS: 40.0000 mg | ORAL_TABLET | Freq: Every day | ORAL | Status: DC

## 2023-08-03 NOTE — Telephone Encounter (Signed)
-----   Message from Marca Ancona sent at 08/01/2023  5:52 PM EDT ----- Creatinine up, hold Lasix for a day then decrease to 40 mg once daily after that.  BMET 1 week.

## 2023-08-03 NOTE — Telephone Encounter (Signed)
Patient advised and verbalized understanding,lab appointment scheduled,lab orders entered. Med list updated to reflect changes.   Orders Placed This Encounter  Procedures   Basic metabolic panel    Standing Status:   Future    Standing Expiration Date:   08/02/2024    Order Specific Question:   Release to patient    Answer:   Immediate    Order Specific Question:   Release to patient    Answer:   Immediate [1]

## 2023-08-04 DIAGNOSIS — E7521 Fabry (-Anderson) disease: Secondary | ICD-10-CM | POA: Diagnosis not present

## 2023-08-05 ENCOUNTER — Ambulatory Visit (HOSPITAL_COMMUNITY): Payer: Medicaid Other | Admitting: Mental Health

## 2023-08-05 ENCOUNTER — Ambulatory Visit: Payer: Medicare Other

## 2023-08-05 DIAGNOSIS — F319 Bipolar disorder, unspecified: Secondary | ICD-10-CM

## 2023-08-05 NOTE — Progress Notes (Signed)
Pt connected to tele-therapy appointment. Stated to be in a public space but did not want to miss appointment as she knows rescheduling appointments are far out. Has appointment with pharmacist after scheduled session. Therapist educated believed to have cancellation next week. Pt. Accepted appointment of 9/6  @ 10am. No safety concerns reported.  Stephan Minister [facility]

## 2023-08-10 ENCOUNTER — Other Ambulatory Visit: Payer: Medicare Other | Admitting: *Deleted

## 2023-08-10 NOTE — Patient Instructions (Signed)
Visit Information  [name] was given information about Medicaid Managed Care team care coordination services as a part of their [employer] Medicaid benefit. Sarah Long verbally consentedto engagement with the [facility] team.   If you are experiencing a medical emergency, please call 911 or report to your local emergency department or urgent care.   If you have a non-emergency medical problem during routine business hours, please contact your provider's office and ask to speak with a nurse.   For questions related to your [employer] [facility] health plan, please call: [phone]  OR visit the member homepage at: [email or web address]  If you would like to schedule transportation through your [facility] plan, please call the following number at least 2 days in advance of your appointment: [phone]  If you are experiencing a behavioral health crisis, call the [facility] [facility] Crisis Line at [phone]). The line is available 24 hours a day, seven days a week.  If you would like help to quit smoking, call 1-800-QUIT-NOW (709 673 0529) OR Espaol: 1-855-Djelo-Ya (4-332-951-8841) o para ms informacin haga clic aqu or Text READY to 660-630 to register via text   [name],   Please see education materials related to diabetes provided by MyChart link.  Patient verbalizes understanding of instructions and care plan provided today and agrees to view in MyChart. Active MyChart status and patient understanding of how to access instructions and care plan via MyChart confirmed with patient.     Telephone follow up appointment with Managed Medicaid care management team member scheduled for:09/14/23 @ 2:30pm  Sarah Emms RN, BSN [place]  [facility] RN Care Coordinator [phone]   Following is a copy  of your plan of care:  Care Plan : RN Care Manager Plan of Care  Updates made by Heidi Dach, RN since 08/10/2023 12:00 AM     Problem: Health Management needs related to DMII      Long-Range Goal: Development of Plan of Care to address Health Management needs related to DMII   Start Date: 08/10/2023  Expected End Date: 11/08/2023  Note:   Current Barriers:  Chronic Disease Management support and education needs related to [facility]. Bruning was referred to Case Management for assistance with medication management. Today she feels confident with managing her medications, but would like assistance with managing DMII. She has been unable to wear her Dexcom G7 sensor due to an issue with her account. Recent A1C 10.9.  RNCM Clinical Goal(s):  Patient will verbalize understanding of plan for management of DMII as evidenced by patient reports continue to work with RN Care Manager and/or Social Worker to address care management and care coordination needs related to DMII as evidenced by adherence to CM Team Scheduled appointments     Utilize Dexcom to monitor blood glucose  .   Interventions: Evaluation of current treatment plan related to  self management and patient's adherence to plan as established by provider   Diabetes:  (Status: New goal.) Long Term Goal   Lab Results  Component Value Date   HGBA1C 10.9 (A) 06/30/2023   @ Assessed patient's understanding of A1c goal: <7% Provided education to patient about basic DM disease process; Reviewed medications with patient and discussed importance of medication adherence;        Reviewed prescribed diet with patient MyPlate method, decrease carbs; Discussed plans with patient for ongoing care management follow up and provided patient with direct contact information  for care management team;      Reviewed scheduled/upcoming provider appointments including: 9/5 for labs, 9/10/;         Assessed social determinant of health barriers;         Advised patient to contact Dexcom Customer Service with questions and concerns regarding Dexcom G7 Advised patient to discuss podiatry referral with PCP on 08/16/23  Patient Goals/Self-Care Activities: Take medications as prescribed   Attend all scheduled provider appointments Call provider office for new concerns or questions  check blood sugar at prescribed times: Continuous  drink 6 to 8 glasses of water each day fill half of plate with vegetables limit fast food meals to no more than 1 per week manage portion size

## 2023-08-10 NOTE — Patient Outreach (Signed)
Medicaid Managed Care   Nurse Care Manager Note  08/10/2023 Name:  Sarah Long MRN:  [id number] DOB:  Nov 01, 1971  Sarah Long is an 52 y.o. year old female who is a primary patient of Sarah Frisk, MD.  The [facility] Managed Care Coordination team was consulted for assistance with:    DMII  Ms. Gunnell was given information about Medicaid Managed Care Coordination team services today. Sarah Long Patient agreed to services and verbal consent obtained.  Engaged with patient by telephone for initial visit in response to provider referral for case management and/or care coordination services.   Assessments/Interventions:  Review of past medical history, allergies, medications, health status, including review of consultants reports, laboratory and other test data, was performed as part of comprehensive evaluation and provision of chronic care management services.  SDOH (Social Determinants of Health) assessments and interventions performed: SDOH Interventions    Flowsheet Row Patient Outreach Telephone from 08/10/2023 in [facility] Telephone from 07/12/2023 in [facility] Telephone from 07/11/2023 in [facility] Lab from 05/05/2023 in [facility] Telephone from 05/03/2023 in [facility] Telephone from 04/13/2023 in [facility]  SDOH Interventions        Food Insecurity Interventions Intervention Not Indicated  [Patient utilizing farmers market and food pantrys] -- -- Other (Comment)  Licensed conveyancer and vascular food bag] -- Other (Comment)  Housing Interventions Intervention Not Indicated -- -- -- -- --  Engineer, petroleum Benefit -- -- Secretary/administrator Benefit --  Utilities Interventions Intervention Not Indicated -- -- -- -- --  Financial  Strain Interventions -- Other (Comment)  [wright fund assistance] Other (Comment)  [wright patient care fund referral] -- -- --       Care Plan  Allergies  Allergen Reactions   Lamictal [Lamotrigine] Itching   Strawberry Extract Hives   Sulfa Antibiotics Hives and Itching    Medications Reviewed Today     Reviewed by Heidi Dach, RN (Registered Nurse) on 08/10/23 at 1458  Med List Status: <None>   Medication Order Taking? Sig Documenting Provider Last Dose Status Informant  ARIPiprazole (ABILIFY) 5 MG tablet [id number] Yes Take 1 tablet (5 mg total) by mouth daily. Daine Gip  Active   atorvastatin (LIPITOR) 20 MG tablet [id number] Yes Take 1 tablet (20 mg total) by mouth daily. Sarah Frisk, MD  Active   carvedilol (COREG) 12.5 MG tablet [id number] Yes Take 1 tablet (12.5 mg total) by mouth 2 (two) times daily with a meal. Sarah Frisk, MD  Active   clotrimazole-betamethasone Thurmond Butts) cream [id number]  Apply 1 Application topically daily. Sarah Frisk, MD  Active   Continuous Glucose Sensor (DEXCOM G7 SENSOR) [place] [id number] Yes 1 Device by Does not apply route as directed. Shamleffer, Konrad Dolores, MD  Active   dapagliflozin propanediol (FARXIGA) 10 MG TABS tablet [id number] Yes Take 1 tablet (10 mg total) by mouth daily before breakfast. Sarah Frisk, MD  Active   furosemide (LASIX) 20 MG tablet [id number] Yes Take 2 tablets (40 mg total) by mouth daily. Laurey Morale, MD  Active   GALAFOLD 123 MG CAPS [id number] Yes Take by mouth. [provider]  Active   hydrALAZINE (APRESOLINE) 50 MG tablet [id number] Yes Take one three times daily Sarah Frisk, MD  Active   Insulin Disposable Pump (OMNIPOD 5 G7 INTRO, GEN 5,) KIT [id number] Yes 1 Device by Does not apply route every other day. Shamleffer, Konrad Dolores, MD  Active   Insulin Disposable Pump (OMNIPOD 5 G7 PODS, GEN 5,) MISC [id number] Yes 1 Device by Does not apply route every other day.  Shamleffer, Konrad Dolores, MD  Active   insulin glargine (LANTUS SOLOSTAR) 100 UNIT/ML Solostar Pen [id number] Yes Inject 60 Units into the skin daily. Shamleffer, Konrad Dolores, MD  Active   Insulin Pen Needle 31G X 5 MM MISC [id number] Yes Use as directed in the morning, at noon, in the evening, and at bedtime. Shamleffer, Konrad Dolores, MD  Active   isosorbide mononitrate (ISMO) 20 MG tablet [id number] Yes Take 1 tablet (20 mg total) by mouth daily. Sarah Frisk, MD  Active   latanoprost (XALATAN) 0.005 % ophthalmic solution [id number]  Place 1 drop into both eyes at bedtime. [provider]  Active Self    Discontinued 04/27/23 1048 (Change in therapy)            Med Note>> Sarah Long, [facility]   04/27/2023 10:48 AM waiting on cardiologist recommendation 04/27/23 pt currently in hyperkalemia      Discontinued 04/27/23 1048 (Change in therapy)            Med Note>> Sarah Long, [facility]   04/27/2023 10:48 AM waiting on cardiologist recommendation 04/27/23 pt currently in hyperkalemia    valsartan (DIOVAN) 320 MG tablet [id number] Yes Take 1 tablet (320 mg total) by mouth daily. Sarah Frisk, MD  Active   varenicline (CHANTIX CONTINUING MONTH PAK) 1 MG tablet [id number] Yes Take 1 tablet (1 mg total) by mouth 2 (two) times daily. Sarah Frisk, MD  Active             Patient Active Problem List   Diagnosis Date Noted   Skin rash 06/28/2023   Alcohol use disorder 06/01/2023   PTSD (post-traumatic stress disorder) 05/13/2023   Cardiac amyloidosis (HCC) 04/27/2023   Dysuria 01/13/2023   Type 2 diabetes mellitus with microalbuminuria, with long-term current use of insulin (HCC) 12/15/2022   Peripheral vascular disease, unspecified (HCC) 03/22/2022   Type 2 diabetes mellitus with diabetic polyneuropathy, with long-term current use of insulin (HCC) 02/03/2022   Type 2 diabetes mellitus with hyperglycemia, with long-term current use of insulin (HCC) 02/03/2022    Microalbuminuria 02/03/2022   Right great toe amputee (HCC) 11/03/2021   Class 2 severe obesity due to excess calories with serious comorbidity and body mass index (BMI) of 36.0 to 36.9 in adult [facility]) 10/15/2021   Insomnia 10/07/2021   Left below-knee amputee (HCC) 07/01/2021   Normocytic anemia 06/26/2021   Long failure with mid-range ejection fraction (HCC) 06/26/2021   Diabetic nephropathy with proteinuria (HCC) 06/26/2021   Dyspnea    Vaginal discharge 07/16/2020   Dental caries 04/01/2020   Cracked tooth 04/01/2020   Vitamin D deficiency 03/11/2020   Prolonged QT interval 02/26/2020   Chronic combined systolic and diastolic CHF (congestive Long failure) (HCC) 02/26/2020   Long block AV complete (HCC) 02/11/2020   Exocrine pancreatic insufficiency    Dysmenorrhea 10/02/2019   DM type 2 with diabetic peripheral neuropathy (HCC) 05/30/2019   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 block) 05/29/2019   Former smoker 11/26/2013   Hypercholesteremia 02/02/2007   Bipolar 1 disorder, depressed (HCC) 02/02/2007   HYPERTENSION, BENIGN SYSTEMIC 02/02/2007    Conditions to be addressed/monitored  per PCP order:  DMII  Care Plan : RN Care Manager Plan of Care  Updates made by Heidi Dach, RN since 08/10/2023 12:00 AM     Problem: Health Management needs related to DMII      Long-Range Goal: Development of Plan of Care to address Health Management needs related to DMII   Start Date: 08/10/2023  Expected End Date: 11/08/2023  Note:   Current Barriers:  Chronic Disease Management support and education needs related to [facility]. [place] was referred to Case Management for assistance with medication management. Today she feels confident with managing her medications, but would like assistance with managing DMII. She has been unable to wear her Dexcom G7 sensor due to an issue with her account. Recent A1C 10.9.  RNCM Clinical Goal(s):  Patient will verbalize  understanding of plan for management of DMII as evidenced by patient reports continue to work with RN Care Manager and/or Social Worker to address care management and care coordination needs related to DMII as evidenced by adherence to CM Team Scheduled appointments     Utilize Dexcom to monitor blood glucose  .   Interventions: Evaluation of current treatment plan related to  self management and patient's adherence to plan as established by provider   Diabetes:  (Status: New goal.) Long Term Goal   Lab Results  Component Value Date   HGBA1C 10.9 (A) 06/30/2023   @ Assessed patient's understanding of A1c goal: <7% Provided education to patient about basic DM disease process; Reviewed medications with patient and discussed importance of medication adherence;        Reviewed prescribed diet with patient MyPlate method, decrease carbs; Discussed plans with patient for ongoing care management follow up and provided patient with direct contact information for care management team;      Reviewed scheduled/upcoming provider appointments including: 9/5 for labs, 9/10/;         Assessed social determinant of health barriers;        Advised patient to contact Dexcom Customer Service with questions and concerns regarding Dexcom G7 Advised patient to discuss podiatry referral with PCP on 08/16/23  Patient Goals/Self-Care Activities: Take medications as prescribed   Attend all scheduled provider appointments Call provider office for new concerns or questions  check blood sugar at prescribed times: Continuous  drink 6 to 8 glasses of water each day fill half of plate with vegetables limit fast food meals to no more than 1 per week manage portion size       Follow Up:  Patient agrees to Care Plan and Follow-up.  Plan: The Managed Medicaid care management team will reach out to the patient again over the next 30 days.  Date/time of next scheduled RN care management/care coordination  outreach:  09/14/23 @ 2:30pm  [name] RN, BSN [place]  [facility] RN Care Coordinator [phone]

## 2023-08-11 ENCOUNTER — Ambulatory Visit (HOSPITAL_COMMUNITY)
Admission: RE | Admit: 2023-08-11 | Discharge: 2023-08-11 | Disposition: A | Discharge status: Home / Self Care | Payer: Medicare Other | Source: Ambulatory Visit | Attending: Cardiology | Admitting: Cardiology

## 2023-08-11 DIAGNOSIS — I5022 Chronic systolic (congestive) heart failure: Secondary | ICD-10-CM | POA: Diagnosis not present

## 2023-08-11 LAB — BASIC METABOLIC PANEL
Anion gap: 9 (ref 5–15)
BUN: 47 mg/dL — ABNORMAL HIGH (ref 6–20)
CO2: 16 mmol/L — ABNORMAL LOW (ref 22–32)
Calcium: 8.9 mg/dL (ref 8.9–10.3)
Chloride: 106 mmol/L (ref 98–111)
Creatinine, Ser: 1.74 mg/dL — ABNORMAL HIGH (ref 0.44–1.00)
GFR, Estimated: 36 mL/min — ABNORMAL LOW (ref >60)
Glucose, Bld: 352 mg/dL — ABNORMAL HIGH (ref 70–99)
Potassium: 5 mmol/L (ref 3.5–5.1)
Sodium: 131 mmol/L — ABNORMAL LOW (ref 135–145)

## 2023-08-12 ENCOUNTER — Ambulatory Visit (INDEPENDENT_AMBULATORY_CARE_PROVIDER_SITE_OTHER): Payer: Medicaid Other | Admitting: Mental Health

## 2023-08-12 DIAGNOSIS — F319 Bipolar disorder, unspecified: Secondary | ICD-10-CM

## 2023-08-12 DIAGNOSIS — F4310 Post-traumatic stress disorder, unspecified: Secondary | ICD-10-CM

## 2023-08-12 NOTE — Progress Notes (Unsigned)
THERAPIST PROGRESS NOTE Virtual Visit via Video Note  I connected with Sarah Long on 08/12/23 at 10:00 AM EDT by a video enabled telemedicine application and verified that I am speaking with the correct person using two identifiers.  Location: Patient: home address on file Provider: home office   I discussed the limitations of evaluation and management by telemedicine and the availability of in person appointments. The patient expressed understanding and agreed to proceed.  I discussed the assessment and treatment plan with the patient. The patient was provided an opportunity to ask questions and all were answered. The patient agreed with the plan and demonstrated an understanding of the instructions.   The patient was advised to call back or seek an in-person evaluation if the symptoms worsen or if the condition fails to improve as anticipated.  I provided 40 minutes of non-face-to-face time during this encounter.   Dorris Singh, [facility]   Session Time: 10:10 am ( 40 minutes)  Participation Level: Active  Behavioral Response: CasualAlertAdequate  Type of Therapy: Individual therapy  Treatment Goals addressed: STG: [place] will increase ability to cope with stressors AEB development of x 3 coping skills for stress with ability to engage in self-care daily within the next 90 days.   ProgressTowards Goals: Progressing  Interventions: CBT and Supportive  Summary: Sarah Long is a 52 y.o. female who presents with dx of Bipolar disorder and PTSD with hx of alcohol use. Sarah Long presents for session alert and oriented; mood and affect adequate. Speech clear and coherent rapid rate; normal tone. Thought process logical; tangential at times. Assessment completed 05/23/23. Shares to have started to retake medications however ongoing difficulty sleeping and shares for increased in irritability as well. Shares current stressors related to finances and notes to hold an extremely high  light bill. Denies concerns for depression and working to manage moods with volunteering at church and engaging with natural supports to cope. Notes ability to engage in balanced thinking and does not allow self to over stress. Shares awaiting ability to return to school once current DWI charge is held and notes upcoming court date. Notes working to engage in healthy habits and choices and working to manage moods, navigating children and maintaining stability in community. Agrees to follow up with provider and monitor moods.   Suicidal/Homicidal: Nowithout intent/plan  Therapist Response: Therapist engaged [place] in [employer]. Completed check in and assessed current level of functioning, sxs management and level of functioning. Provided safe space for Sarah Long to share concerting current stressors and ability to manage and cope. Explored ability to discuss with landlord and making needed parties aware of light bill. Assessed for stability in moods and encouraged medication compliance. Discussed and processed working to manage and cope with stress effectively as well as importance of adequate sleep. Explored ability to engage in healthy balanced choices to support in maintaining stability in community. Assessed for drinking behaviors. Reviewed session and provided follow up.  Plan: Return again in  x 2 weeks.  Diagnosis: Bipolar 1 disorder, depressed (HCC)  PTSD (post-traumatic stress disorder)  Collaboration of Care: Other None  Patient/Guardian was advised Release of Information must be obtained prior to any record release in order to collaborate their care with an outside provider. Patient/Guardian was advised if they have not already done so to contact the registration department to sign all necessary forms in order for [place] to release information regarding their care.   Consent: Patient/Guardian gives verbal consent for treatment and assignment  of benefits for services provided during this  visit. Patient/Guardian expressed understanding and agreed to proceed.   Stephan Minister [place], [facility] 08/12/2023

## 2023-08-15 ENCOUNTER — Other Ambulatory Visit: Payer: Self-pay | Admitting: Critical Care Medicine

## 2023-08-15 NOTE — Progress Notes (Unsigned)
Established Patient Office Visit  Subjective:  Patient ID: Sarah Long, female    DOB: [birth year]  Age: 52 y.o. MRN: [id number]  CC:  No chief complaint on file.   HPI 11/2021 Sarah Long presents for primary care follow-up and diabetes management  Since last seen in October the patient has continued improvement.  She has been followed closely by endocrinology and her A1c is now down to 7.4.  It had been greater than 12.  The patient also now has a Dexcom glucometer and has showing data suggesting she is staying in the 100-150 range of her blood sugars.  The patient has had a left below-knee amputation now has a functional orthotic.  Patient is followed by rehab.  Patient's phantom limb pain is being managed by the rehab physician and she states is markedly better.  She has had 1 toe amputated on the right foot.  Recently developed a sore internally in her mouth.  This patient has poor dentition and needs to be seen by dentist.  Also she is living in a hotel and may be losing her housing soon and needs assistance in this regard.  Blood pressure is good on arrival 132/87  4/17 Patient seen in return follow-up unfortunately not been taking her oral medications because she cannot afford the co-pay.  She is taking her insulin products.  On arrival blood pressure is elevated 145/102.  She does have a Dexcom meter and recently saw endocrinology.  Below is documentation from that visit.  Endo 02/2022 Today (02/03/22): Sarah Long is here for a follow up on diabetes management. She checks her  blood sugars multiple times daily through CGM.  The patient has  had hypoglycemic episodes since the last clinic visit, which typically occur rarely and after a bolus.     She has made lifestyle changes with weight loss  Has noted prosthetics    HOME DIABETES REGIMEN: Lantus 35 units daily - takes 45  Humalog 10 units TID QAC- 10-15 units  CF: Humalog (BG -130/25)      Statin: no ACE-I/ARB: no Prior  Diabetic Education: yes  Glycemic patterns summary: BG's within goal at night, high during the day    Hyperglycemic episodes postprandial    Hypoglycemic episodes occurred post bolus   Overnight periods: trends down       DIABETIC COMPLICATIONS: Microvascular complications:  Left BKA, Right  great toe amputation , neuropathy  Denies: CKD, retinopathy  Last eye exam: Completed 01/2021   Macrovascular complications:  PAD Denies: CAD, CVA   1) Type 2 Diabetes Mellitus, Sub-Optimally controlled, With neuropathic complications, S/P Left BKA and Right great toe amputation  - Most recent A1c of 7.9 %. Goal A1c < 7.0 %.       - A1c remains above goal - She unfortunately has self adjusted her insulin regimen, it appears that she has not been using the correction scale, she does admit to guessing on prandial dose has occasional hypoglycemia -We discussed the importance of following instructions so that we make the appropriate changes were necessary -In review of her CGM, I am going to increase her basal as well as prandial dose, she will be provided with a new correction scale - She has a hx of Pancreatitis , not a candidate for DPP 4 inhibitors and GLP-1 agonist - Has hx of DKA , would avoid SGLT2 inhibitors       MEDICATIONS: Increase Lantus to 50 units daily  Take Humalog 12 units TIDQAC  Correction Factor: Humalog (BG -130/25)    EDUCATION / INSTRUCTIONS: BG monitoring instructions: Patient is instructed to check her blood sugars 3 times a day, before meals . Call [facility] if: BG persistently < 70  I reviewed the Rule of 15 for the treatment of hypoglycemia in detail with the patient. Literature supplied.     2) Diabetic complications:  Eye: Does not have known diabetic retinopathy.  Neuro/ Feet: Does  have known diabetic peripheral neuropathy. Renal: Patient does not have known baseline CKD. She is not on an ACEI/ARB at present.     3) Dyslipidemia :      - She is not on atorvastatin, patient does not like taking medication and would rather use natural route if possible -We discussed the increased risk of cardiovascular disease as well as CVA with hyperlipidemia in the setting of diabetes   4) Microalbuminuria:       -She has losartan listed but she is not taking it.  We discussed the renal benefits with ARB and I have encouraged her to consider taking it   Medication Restart losartan 25 mg daily   Patient never restarted the losartan or atorvastatin because she cannot afford the $4 co-pay.  She is using insulin as prescribed.  She still living in a motel with her children but is having difficulty affording the rent.  9/5 Patient seen in return follow-up and is followed by cardiology closely for her cardiomyopathy nonischemic.  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  She is undergoing genetic testing for this.  She does have a pacemaker in place.  Cardiac medicines have been adjusted by cardiology.  Patient is also followed by endocrinology and she is on 70 units of Lantus and a sliding scale Humalog average dose of 14 units before meals.  She is trying to get set up for the insulin pump but is yet to achieve this.  She was seen by ophthalmology in April of this year and found to not have diabetic eye disease.  She is due a colon cancer screening and agrees to receive the Cologuard kit.  She needs dental care follow-up.  Also needs mental health follow-up.  She cannot take Latuda because of her cardiac disease and had itching with Lamictal.  She is due a flu vaccine and agrees to receive this is also due foot exam at this visit  01/13/23 Patient is seen in return follow-up has not been seen since last September.  Patient's had difficulty with her insurance partially approving some of her diabetic supplies including her insulin pump and Dexcom 6.  She has been followed by endocrinology documentation of the last visit is as below.   She still has elevated A1c.  She is on long and short acting insulin at this time.  She is not a candidate for a GLP-1 because of pancreatitis in the past.  Patient does have constipation as well.  She was off cigarettes with use of Chantix she ran out of Chantix now she is back smoking.  Also has mild excess bleeding and cervical vaginal discharge she may yet need gynecology referral.  She also is having some dysuria.  She has a blood pressure meter at home but does not take the blood pressure.  Blood pressure today on arrival is elevated 166/103. Patient is now out of the hotel living in a home.  Her children are with her.  Below is a recent endocrinology note 1) Type 2 Diabetes Mellitus, Poorly  controlled, With neuropathic complications, S/P Left BKA and Right great toe amputation  - Most recent A1c of 8.8 %. Goal A1c < 7.0 %.       - A1c remains above goal - She has a hx of Pancreatitis , not a candidate for DPP 4 inhibitors and GLP-1 agonist - Has hx of DKA , would avoid SGLT2 inhibitors -She has not been on the pump for the past 3 weeks due to insurance coverage, she is working with her current insurance and will be able to eventually stay on Medicaid and will obtain the OmniPod pump.  Patient liked the pump and would like to stay on it -Despite her elevated A1c, we have been able to download her pump through December, her average BG's at the time 142 mg/DL, within range BG's have improved from 26% to 74%.  And hyperglycemia has improved from 44% down to 20%. -Overall if she is able to continue on the pump this will be a better option, the patient was advised that if she has no Dexcom she should continue to check glucose before each meal and entered into the pump while in the manual mode -I also emphasized the importance of being on basal insulin if she is not going to be on the pump for 24 hours or more -A refill of the Lantus was prescribed to use when she is of the pump, patient was advised  to continue to use Humalog 14 units with each meal -Unable to make any changes to the pump settings as the patient did not bring her PDM     MEDICATIONS:   -Take Lantus 60 units daily while of OmniPod -Continue Humalog 14 units with each meal -Patient will return to the current pump settings once the insurance issues have been resolved   Below is recent cardiology note the patient appears to have a form of amyloidosis and is undergoing consideration for treatment at [facility] 1. Chronic systolic CHF: Echo in 3/21 with EF 40-45%, severe LV hypertrophy.  This was found in association with complete heart block.  Nonischemic cardiomyopathy, cath in 3/21 showed no significant CAD.  PYP scan (6/21) not suggestive of TTR amyloidosis. cMRI (2/23) showed LVEF 68%, severe LVH, ECV 54%, no LVOT gradient, poor nulling with uninterpretable delayed enhancement. CTA chest did not show evidence for pulmonary sarcoidosis.  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  There is wide phenotypic variation in female Fabry's heterozygotes, but I suspect that the cardiomyopathy with LVH and h/o complete heart block in this situation is most likely due to Fabry's disease. She is not volume overloaded on exam with NYHA class II-III symptoms.  - We will continue to work to get her an appt at Dr. Hilda Lias McDonald's office at [facility]. She will need treatment with Fabrazyme (enzyme replacement).  - Continue hydralazine 25 mg tid + Imdur 20 mg daily.  - Continue Lasix 40 qam/20 qpm. BMET today.  - Continue spironolactone 25 mg daily.  - Continue Coreg 6.25 mg bid. - Continue losartan 50 mg daily. - Echo at followup appt.  2. HTN: Mildly elevated today, but generally well-controlled at home. No changes for now. 3. Hyperlipidemia: Good lipids 5/23. 4. H/o smoking: Quit x 1 month.    - Continue Chantix. 5. Type 2 diabetes: Followed by  Endocrinology. She is on insulin. 6. Complete heart block: She has a Secondary school teacher PPM and is pacing  her RV > 99% of the time.  It is possible that this could be related to Fabry's disease.  7. Obesity: Body mass index is 36.39 kg/m. - Considered referral for GLP1RA, but she has history of pancreatitis. 8. CKD stage 3: Suspect diabetic nephropathy.  This may be related to Fabry's disease as well.  - BMET today.   03/15/23 she has been without medication for 1 month due to insurance barriers.  The patient is seen in return follow-up she now has achieved Medicaid and can get medications again unfortunately with this blood pressure is elevated as is blood sugar.  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  She has changes in the right foot and may need to orthopedics again.  She does have housing and is renting this.  She is no longer drinking alcohol.  04/27/23 Patient seen in return follow-up comes in a very depressed tearful state.  She needs behavioral health history of bipolar not on medications.  She stays exhausted.  She has been off bipolar meds for 5 years.  Blood pressure is elevated 159/83 she takes her blood pressure at home it is around 138/88.  Patient had been recently seen by cardiology labs showed elevated potassium she stopped her Aldactone and losartan from cardiology and today blood pressure remains high she also has a vaginal discharge and did have a urine culture with cardiology UA showed hematuria and increased protein and increased glucose.  Patient needs medication refills.  She has a G6 transmitter and this needs to be refilled we did message endocrinology they will take care of this.  We also message cardiology about what is the follow-up without Aldactone and losartan  Orthopedic recently saw the patient and stated her right foot was improved  06/29/23 Patient seen in return follow-up on arrival blood pressure 70/101 blood sugar 324.  The patient has an Omnipod  and a CGM monitor but has not set these up as of yet.  There is been barriers with the insurance.  She has not been using her longer short acting KwikPen insulins.  She is also stopped all of her blood pressure heart medicines.  She became confused when cardiology asked her to hold a few of her medicine she held all of them.  Also complains of a rash in her back.  Patient does have a follow-up appointment this Thursday with endocrinology.  She was considering to skip this.  She has been encouraged to keep this appointment.   9/10 Past Medical History:  Diagnosis Date   Abnormal Pap smear 1998   Abnormal vaginal bleeding 12/19/2011   Acute urinary retention 06/26/2021   Arthritis    Bacterial infection    Bipolar 1 disorder (HCC)    Blister of second toe of left foot 11/21/2016   Candida vaginitis 07/2007   Depression    recently added wellbutrin-has not taken yet for bipolar   Diabetes in pregnancy    Diabetes mellitus    nph 20U qam and qpm, regular with meals   Diabetic ketoacidosis without coma associated with type 2 diabetes mellitus (HCC)    Fibroid    Galactorrhea of right breast 2008   GERD (gastroesophageal reflux disease)    H/O amenorrhea 07/2007   H/O dizziness 10/14/2011   H/O dysmenorrhea 2010   H/O menorrhagia 10/14/2011   H/O varicella    Headache(784.0)    Heavy vaginal bleeding due to contraceptive injection use 10/12/2011   Depo Provera  Herpes    Homelessness 11/19/2021   HSV-2 infection 01/03/2009   Hx: UTI (urinary tract infection) 2009   Hypertension    on aldomet   Hypoalbuminemia due to protein-calorie malnutrition (HCC) 06/26/2021   Increased BMI 2010   Irregular uterine bleeding 04/04/2012   Pt has mirena    Obesity 10/14/2011   Oligomenorrhea 07/2007   Osteomyelitis of great toe of right foot (HCC) 03/22/2022   Pelvic pain in female    Presence of permanent cardiac pacemaker    Preterm labor    PTSD (post-traumatic stress disorder)     Trichomonas    Yeast infection     Past Surgical History:  Procedure Laterality Date   ABDOMINAL AORTOGRAM W/LOWER EXTREMITY N/A 06/17/2021   Procedure: ABDOMINAL AORTOGRAM W/LOWER EXTREMITY;  Surgeon: Iran Ouch, MD;  Location: [facility];  Service: Cardiovascular;  Laterality: N/A;   AMPUTATION Left 06/27/2021   Procedure: AMPUTATION BELOW KNEE;  Surgeon: Nadara Mustard, MD;  Location: [facility];  Service: Orthopedics;  Laterality: Left;   AMPUTATION Right 09/04/2021   Procedure: RIGHT GREAT TOE AMPUTATION;  Surgeon: Nadara Mustard, MD;  Location: [facility] OR;  Service: Orthopedics;  Laterality: Right;   CESAREAN SECTION  1991   LEFT HEART CATH AND CORONARY ANGIOGRAPHY N/A 02/10/2020   Procedure: LEFT HEART CATH AND CORONARY ANGIOGRAPHY;  Surgeon: Lennette Bihari, MD;  Location: [facility];  Service: Cardiovascular;  Laterality: N/A;   PACEMAKER IMPLANT N/A 02/13/2020   Procedure: PACEMAKER IMPLANT;  Surgeon: Regan Lemming, MD;  Location: [facility];  Service: Cardiovascular;  Laterality: N/A;   TEMPORARY PACEMAKER N/A 02/10/2020   Procedure: TEMPORARY PACEMAKER;  Surgeon: Lennette Bihari, MD;  Location: [facility] INVASIVE CV LAB;  Service: Cardiovascular;  Laterality: N/A;    Family History  Problem Relation Age of Onset   Hypertension Mother    Diabetes Mother    Heart disease Mother    Hypertension Father    Diabetes Father    Heart disease Father    Stroke Maternal Grandfather    Depression Maternal Grandmother    Suicidality Maternal Grandmother    Schizophrenia Paternal Grandmother    Other Neg Hx    Breast cancer Neg Hx     Social History   Socioeconomic History   Marital status: Significant Other    Spouse name: Not on file   Number of children: Not on file   Years of education: Not on file   Highest education level: Some college, no degree  Occupational History   Not on file  Tobacco Use   Smoking status: Every Day    Current packs/day: 0.00     Average packs/day: 0.5 packs/day for 20.0 years (10.0 ttl pk-yrs)    Types: Cigarettes    Start date: 07/01/2001    Last attempt to quit: 07/01/2021    Years since quitting: 2.1   Smokeless tobacco: Never   Tobacco comments:    1. Centrix to stop smoking   Vaping Use   Vaping status: Never Used  Substance and Sexual Activity   Alcohol use: Yes    Comment: 2-5 drinks in a sitting about once every 2 weeks; history of excessive use   Drug use: No   Sexual activity: Yes    Partners: Female, Female    Birth control/protection: None  Other Topics Concern   Not on file  Social History Narrative   Not on file   Social Determinants of Health  Financial Resource Strain: High Risk (07/12/2023)   Overall Financial Resource Strain (CARDIA)    Difficulty of Paying Living Expenses: Very hard  Food Insecurity: Food Insecurity Present (08/10/2023)   Hunger Vital Sign    Worried About Running Out of Food in the Last Year: Sometimes true    Ran Out of Food in the Last Year: Sometimes true  Transportation Needs: Unmet Transportation Needs (08/10/2023)   PRAPARE - Administrator, Civil Service (Medical): Yes    Lack of Transportation (Non-Medical): No  Physical Activity: Insufficiently Active (04/01/2023)   Exercise Vital Sign    Days of Exercise per Week: 3 days    Minutes of Exercise per Session: 20 min  Stress: Stress Concern Present (04/01/2023)   [employer] of Occupational Health - Occupational Stress Questionnaire    Feeling of Stress : Very much  Social Connections: Moderately Isolated (04/01/2023)   Social Connection and Isolation Panel [NHANES]    Frequency of Communication with Friends and Family: Once a week    Frequency of Social Gatherings with Friends and Family: Once a week    Attends Religious Services: More than 4 times per year    Active Member of [employer] or Organizations: Yes    Attends Engineer, structural: More than 4 times per year    Marital Status:  Divorced  Intimate Partner Violence: Not At Risk (08/10/2023)   Humiliation, Afraid, Rape, and Kick questionnaire    Fear of Current or Ex-Partner: No    Emotionally Abused: No    Physically Abused: No    Sexually Abused: No    Outpatient Medications Prior to Visit  Medication Sig Dispense Refill   ARIPiprazole (ABILIFY) 5 MG tablet Take 1 tablet (5 mg total) by mouth daily. 30 tablet 2   atorvastatin (LIPITOR) 20 MG tablet Take 1 tablet (20 mg total) by mouth daily. 90 tablet 3   carvedilol (COREG) 12.5 MG tablet Take 1 tablet (12.5 mg total) by mouth 2 (two) times daily with a meal. 120 tablet 1   clotrimazole-betamethasone (LOTRISONE) cream Apply 1 Application topically daily. 30 g 0   Continuous Glucose Sensor (DEXCOM G7 SENSOR) MISC 1 Device by Does not apply route as directed. 9 each 3   dapagliflozin propanediol (FARXIGA) 10 MG TABS tablet Take 1 tablet (10 mg total) by mouth daily before breakfast. 30 tablet 11   furosemide (LASIX) 20 MG tablet Take 2 tablets (40 mg total) by mouth daily.     GALAFOLD 123 MG CAPS Take by mouth.     hydrALAZINE (APRESOLINE) 50 MG tablet Take one three times daily 180 tablet 1   Insulin Disposable Pump (OMNIPOD 5 G7 INTRO, GEN 5,) KIT 1 Device by Does not apply route every other day. 1 kit 0   Insulin Disposable Pump (OMNIPOD 5 G7 PODS, GEN 5,) MISC 1 Device by Does not apply route every other day. 45 each 3   insulin glargine (LANTUS SOLOSTAR) 100 UNIT/ML Solostar Pen Inject 60 Units into the skin daily. 60 mL 3   Insulin Pen Needle 31G X 5 MM MISC Use as directed in the morning, at noon, in the evening, and at bedtime. 400 each 3   isosorbide mononitrate (ISMO) 20 MG tablet Take 1 tablet (20 mg total) by mouth daily. 90 tablet 3   latanoprost (XALATAN) 0.005 % ophthalmic solution Place 1 drop into both eyes at bedtime.     valsartan (DIOVAN) 320 MG tablet Take 1 tablet (320 mg total)  by mouth daily. 90 tablet 3   varenicline (CHANTIX CONTINUING MONTH  PAK) 1 MG tablet Take 1 tablet (1 mg total) by mouth 2 (two) times daily. 60 tablet 1   No facility-administered medications prior to visit.    Allergies  Allergen Reactions   Lamictal [Lamotrigine] Itching   Strawberry Extract Hives   Sulfa Antibiotics Hives and Itching    ROS Review of Systems  Constitutional:  Negative for chills, diaphoresis, fever and unexpected weight change.  HENT: Negative.  Negative for congestion, ear pain, hearing loss, nosebleeds, postnasal drip, rhinorrhea, sinus pressure, sore throat, tinnitus, trouble swallowing and voice change.   Eyes: Negative.  Negative for photophobia and redness.  Respiratory: Negative.  Negative for apnea, cough, choking, chest tightness, shortness of breath, wheezing and stridor.   Cardiovascular: Negative.  Negative for chest pain, palpitations and leg swelling.  Gastrointestinal: Negative.  Negative for abdominal distention, abdominal pain, blood in stool, constipation, diarrhea, nausea and vomiting.  Endocrine: Negative for polydipsia.  Genitourinary:  Negative for dysuria, flank pain, frequency, hematuria, urgency and vaginal discharge.  Musculoskeletal: Negative.  Negative for arthralgias, back pain, myalgias and neck pain.       Vascular changes right foot second toe  Skin:  Positive for rash.  Allergic/Immunologic: Negative.  Negative for environmental allergies and food allergies.  Neurological: Negative.  Negative for dizziness, tremors, seizures, syncope, weakness and headaches.  Hematological: Negative.  Negative for adenopathy. Does not bruise/bleed easily.  Psychiatric/Behavioral:  Positive for behavioral problems, decreased concentration and dysphoric mood. Negative for agitation, self-injury, sleep disturbance and suicidal ideas. The patient is nervous/anxious.       Objective:    Physical Exam Vitals reviewed.  Constitutional:      Appearance: Normal appearance. She is well-developed. She is obese. She is  not diaphoretic.  HENT:     Head: Normocephalic and atraumatic.     Nose: No nasal deformity, septal deviation, mucosal edema or rhinorrhea.     Right Sinus: No maxillary sinus tenderness or frontal sinus tenderness.     Left Sinus: No maxillary sinus tenderness or frontal sinus tenderness.     Mouth/Throat:     Mouth: Mucous membranes are moist.     Pharynx: No oropharyngeal exudate.     Comments: There is a slight ulceration inside the right cheek mucosa and there is a fractured molar which likely has caused the injury Eyes:     General: No scleral icterus.    Conjunctiva/sclera: Conjunctivae normal.     Pupils: Pupils are equal, round, and reactive to light.  Neck:     Thyroid: No thyromegaly.     Vascular: No carotid bruit or JVD.     Trachea: Trachea normal. No tracheal tenderness or tracheal deviation.  Cardiovascular:     Rate and Rhythm: Normal rate and regular rhythm.     Chest Wall: PMI is not displaced.     Pulses: Normal pulses. No decreased pulses.     Heart sounds: Normal heart sounds, S1 normal and S2 normal. Heart sounds not distant. No murmur heard.    No systolic murmur is present.     No diastolic murmur is present.     No friction rub. No gallop. No S3 or S4 sounds.  Pulmonary:     Effort: No tachypnea, accessory muscle usage or respiratory distress.     Breath sounds: No stridor. No decreased breath sounds, wheezing, rhonchi or rales.  Chest:     Chest wall: No tenderness.  Abdominal:     General: Bowel sounds are normal. There is no distension.     Palpations: Abdomen is soft. Abdomen is not rigid.     Tenderness: There is no abdominal tenderness. There is no guarding or rebound.  Musculoskeletal:        General: Normal range of motion.     Cervical back: Normal range of motion and neck supple. No edema, erythema or rigidity. No muscular tenderness. Normal range of motion.     Right lower leg: No tenderness. No edema.     Comments: Orthotic on left  below-knee amputation left leg functioning well Blackened second toe right foot stable  Lymphadenopathy:     Head:     Right side of head: No submental or submandibular adenopathy.     Left side of head: No submental or submandibular adenopathy.     Cervical: No cervical adenopathy.  Skin:    General: Skin is warm and dry.     Coloration: Skin is not pale.     Findings: Rash present.     Nails: There is no clubbing.     Comments: Fungal rash left posterior buttocks  Neurological:     General: No focal deficit present.     Mental Status: She is alert and oriented to person, place, and time.     Sensory: No sensory deficit.  Psychiatric:        Attention and Perception: Attention and perception normal.        Mood and Affect: Mood is anxious and depressed. Affect is tearful.        Speech: Speech normal.        Behavior: Behavior normal. Behavior is cooperative.        Thought Content: Thought content normal. Thought content does not include homicidal or suicidal ideation. Thought content does not include homicidal or suicidal plan.        Cognition and Memory: Cognition normal.        Judgment: Judgment normal.     There were no vitals taken for this visit. Wt Readings from Last 3 Encounters:  07/22/23 219 lb 9.6 oz (99.6 kg)  07/22/23 218 lb (98.9 kg)  06/30/23 219 lb (99.3 kg)     Health Maintenance Due  Topic Date Due   Medicare Annual Wellness (AWV)  Never done   Fecal DNA (Cologuard)  Never done   OPHTHALMOLOGY EXAM  03/11/2023   INFLUENZA VACCINE  07/07/2023   PAP SMEAR-Modifier  09/10/2023     There are no preventive care reminders to display for this patient.  Lab Results  Component Value Date   TSH 4.396 02/11/2020   Lab Results  Component Value Date   WBC 8.0 11/23/2022   HGB 10.9 (L) 11/23/2022   HCT 33.9 (L) 11/23/2022   MCV 91.1 11/23/2022   PLT 198 11/23/2022   Lab Results  Component Value Date   NA 131 (L) 08/11/2023   K 5.0 08/11/2023    CO2 16 (L) 08/11/2023   GLUCOSE 352 (H) 08/11/2023   BUN 47 (H) 08/11/2023   CREATININE 1.74 (H) 08/11/2023   BILITOT <0.2 04/05/2023   ALKPHOS 79 04/05/2023   AST 11 04/05/2023   ALT 10 04/05/2023   PROT 6.2 04/05/2023   ALBUMIN 3.4 (L) 04/05/2023   CALCIUM 8.9 08/11/2023   ANIONGAP 9 08/11/2023   EGFR 49 (L) 06/28/2023   Lab Results  Component Value Date   CHOL 103 07/22/2023   Lab Results  Component  Value Date   HDL 40 (L) 07/22/2023   Lab Results  Component Value Date   LDLCALC 39 07/22/2023   Lab Results  Component Value Date   TRIG 121 07/22/2023   Lab Results  Component Value Date   CHOLHDL 2.6 07/22/2023   Lab Results  Component Value Date   HGBA1C 10.9 (A) 06/30/2023      Assessment & Plan:   Problem List Items Addressed This Visit   None  Cardiology contacted they will see the patient sooner 38 minutes spent complex decision making patient extremely upset follow-up: No follow-ups on file.   Shan Levans, MD

## 2023-08-16 ENCOUNTER — Encounter: Payer: Self-pay | Admitting: Critical Care Medicine

## 2023-08-16 ENCOUNTER — Ambulatory Visit: Payer: Medicaid Other | Attending: Critical Care Medicine | Admitting: Critical Care Medicine

## 2023-08-16 VITALS — BP 115/75 | HR 86 | Wt 215.4 lb

## 2023-08-16 DIAGNOSIS — I110 Hypertensive heart disease with heart failure: Secondary | ICD-10-CM | POA: Diagnosis not present

## 2023-08-16 DIAGNOSIS — Z794 Long term (current) use of insulin: Secondary | ICD-10-CM | POA: Diagnosis not present

## 2023-08-16 DIAGNOSIS — I5042 Chronic combined systolic (congestive) and diastolic (congestive) heart failure: Secondary | ICD-10-CM | POA: Diagnosis not present

## 2023-08-16 DIAGNOSIS — Z5901 Sheltered homelessness: Secondary | ICD-10-CM | POA: Insufficient documentation

## 2023-08-16 DIAGNOSIS — K0889 Other specified disorders of teeth and supporting structures: Secondary | ICD-10-CM | POA: Insufficient documentation

## 2023-08-16 DIAGNOSIS — E1151 Type 2 diabetes mellitus with diabetic peripheral angiopathy without gangrene: Secondary | ICD-10-CM | POA: Diagnosis not present

## 2023-08-16 DIAGNOSIS — T466X6A Underdosing of antihyperlipidemic and antiarteriosclerotic drugs, initial encounter: Secondary | ICD-10-CM | POA: Diagnosis not present

## 2023-08-16 DIAGNOSIS — E1129 Type 2 diabetes mellitus with other diabetic kidney complication: Secondary | ICD-10-CM | POA: Diagnosis not present

## 2023-08-16 DIAGNOSIS — I10 Essential (primary) hypertension: Secondary | ICD-10-CM

## 2023-08-16 DIAGNOSIS — E785 Hyperlipidemia, unspecified: Secondary | ICD-10-CM | POA: Insufficient documentation

## 2023-08-16 DIAGNOSIS — N912 Amenorrhea, unspecified: Secondary | ICD-10-CM | POA: Insufficient documentation

## 2023-08-16 DIAGNOSIS — Z1211 Encounter for screening for malignant neoplasm of colon: Secondary | ICD-10-CM

## 2023-08-16 DIAGNOSIS — E7800 Pure hypercholesterolemia, unspecified: Secondary | ICD-10-CM | POA: Diagnosis not present

## 2023-08-16 DIAGNOSIS — R1110 Vomiting, unspecified: Secondary | ICD-10-CM | POA: Diagnosis present

## 2023-08-16 DIAGNOSIS — E119 Type 2 diabetes mellitus without complications: Secondary | ICD-10-CM | POA: Diagnosis present

## 2023-08-16 DIAGNOSIS — Z8719 Personal history of other diseases of the digestive system: Secondary | ICD-10-CM | POA: Insufficient documentation

## 2023-08-16 DIAGNOSIS — R109 Unspecified abdominal pain: Secondary | ICD-10-CM | POA: Diagnosis present

## 2023-08-16 DIAGNOSIS — R809 Proteinuria, unspecified: Secondary | ICD-10-CM | POA: Diagnosis not present

## 2023-08-16 DIAGNOSIS — Z91120 Patient's intentional underdosing of medication regimen due to financial hardship: Secondary | ICD-10-CM | POA: Insufficient documentation

## 2023-08-16 DIAGNOSIS — Z87891 Personal history of nicotine dependence: Secondary | ICD-10-CM | POA: Diagnosis not present

## 2023-08-16 DIAGNOSIS — E1140 Type 2 diabetes mellitus with diabetic neuropathy, unspecified: Secondary | ICD-10-CM | POA: Insufficient documentation

## 2023-08-16 DIAGNOSIS — K029 Dental caries, unspecified: Secondary | ICD-10-CM | POA: Insufficient documentation

## 2023-08-16 DIAGNOSIS — Z89512 Acquired absence of left leg below knee: Secondary | ICD-10-CM | POA: Diagnosis not present

## 2023-08-16 DIAGNOSIS — Z89421 Acquired absence of other right toe(s): Secondary | ICD-10-CM | POA: Insufficient documentation

## 2023-08-16 DIAGNOSIS — T465X6A Underdosing of other antihypertensive drugs, initial encounter: Secondary | ICD-10-CM | POA: Diagnosis not present

## 2023-08-16 DIAGNOSIS — Z5986 Financial insecurity: Secondary | ICD-10-CM | POA: Diagnosis not present

## 2023-08-16 MED ORDER — VALSARTAN 320 MG PO TABS: 320.0000 mg | ORAL_TABLET | Freq: Every day | ORAL | 3 refills | Status: DC

## 2023-08-16 MED ORDER — ISOSORBIDE MONONITRATE 20 MG PO TABS: 20.0000 mg | ORAL_TABLET | Freq: Every day | ORAL | 3 refills | Status: DC

## 2023-08-16 MED ORDER — HYDRALAZINE HCL 50 MG PO TABS: ORAL_TABLET | ORAL | 1 refills | Status: DC

## 2023-08-16 MED ORDER — ATORVASTATIN CALCIUM 20 MG PO TABS: 20.0000 mg | ORAL_TABLET | Freq: Every day | ORAL | 3 refills | Status: DC

## 2023-08-16 MED ORDER — DAPAGLIFLOZIN PROPANEDIOL 10 MG PO TABS: 10.0000 mg | ORAL_TABLET | Freq: Every day | ORAL | 11 refills | Status: DC

## 2023-08-16 MED ORDER — CARVEDILOL 12.5 MG PO TABS
12.5000 mg | ORAL_TABLET | Freq: Two times a day (BID) | ORAL | 1 refills | Status: DC
Start: 2023-08-16 — End: 2023-12-22

## 2023-08-16 NOTE — Assessment & Plan Note (Signed)
Referral to dentistry made 

## 2023-08-16 NOTE — Assessment & Plan Note (Signed)
As per endocrinology ?

## 2023-08-16 NOTE — Assessment & Plan Note (Signed)
Has quit smoking on Chantix

## 2023-08-16 NOTE — Patient Instructions (Addendum)
Meds refilled   No med changes  Return 4 months  Labs preg test work up change in periods

## 2023-08-16 NOTE — Assessment & Plan Note (Signed)
Hypertension currently controlled continue with the carvedilol hydralazine isosorbide valsartan

## 2023-08-16 NOTE — Assessment & Plan Note (Signed)
At goal maintain atorvastatin

## 2023-08-17 ENCOUNTER — Telehealth: Payer: Self-pay

## 2023-08-17 LAB — FSH/LH
FSH: 5.5 m[IU]/mL
LH: 4.4 m[IU]/mL

## 2023-08-17 LAB — HUMAN CHORIONIC GONADOTROPIN(HCG),B-SUBUNIT,QUANTITATIVE): HCG, Beta Chain, Quant, S: <1 m[IU]/mL

## 2023-08-17 NOTE — Progress Notes (Signed)
Let pt know she is NOT pregnant, no evidence of menopause as of yet

## 2023-08-17 NOTE — Telephone Encounter (Signed)
-----   Message from Shan Levans sent at 08/17/2023  7:59 AM EDT ----- Let pt know she is NOT pregnant, no evidence of menopause as of yet

## 2023-08-17 NOTE — Telephone Encounter (Signed)
Pt was called and vm was left, Information has been sent to nurse pool.   

## 2023-08-19 ENCOUNTER — Ambulatory Visit (INDEPENDENT_AMBULATORY_CARE_PROVIDER_SITE_OTHER): Payer: Medicaid Other | Admitting: Mental Health

## 2023-08-19 ENCOUNTER — Telehealth (HOSPITAL_COMMUNITY): Payer: Self-pay | Admitting: *Deleted

## 2023-08-19 DIAGNOSIS — F319 Bipolar disorder, unspecified: Secondary | ICD-10-CM

## 2023-08-19 DIAGNOSIS — F4310 Post-traumatic stress disorder, unspecified: Secondary | ICD-10-CM

## 2023-08-19 MED ORDER — ARIPIPRAZOLE 10 MG PO TABS: 10.0000 mg | ORAL_TABLET | Freq: Every day | ORAL | 2 refills | Status: DC

## 2023-08-19 NOTE — Telephone Encounter (Addendum)
Patient called asking for something to help her sleep. States that she has trouble staying asleep only getting about 4 hours of sleep.  Per chart review, titration of abilify previously discussed as likely benefit for symptoms of bipolar 1 disorder which likely include above. Called in 10mg  daily dose of abilify and provided to nursing for recommendations to continue to try and cut back on alcohol use starting with nighttime consumption.

## 2023-08-19 NOTE — Progress Notes (Signed)
   THERAPIST PROGRESS NOTE Virtual Visit via Video Note  I connected with Sarah Long on 08/19/23 at  9:00 AM EDT by a video enabled telemedicine application and verified that I am speaking with the correct person using two identifiers.  Location: Patient: home address on file Provider: office   I discussed the limitations of evaluation and management by telemedicine and the availability of in person appointments. The patient expressed understanding and agreed to proceed.   I discussed the assessment and treatment plan with the patient. The patient was provided an opportunity to ask questions and all were answered. The patient agreed with the plan and demonstrated an understanding of the instructions.   The patient was advised to call back or seek an in-person evaluation if the symptoms worsen or if the condition fails to improve as anticipated.  I provided 25 minutes of non-face-to-face time during this encounter.   Dorris Singh, [facility]   Session Time: 9:03 am ( 25 minutes)  Participation Level: Active  Behavioral Response: CasualAlertNeutral  Type of Therapy: Individual Therapy  Treatment Goals addressed: STG: Sarah Long will increase ability to cope with stressors AEB development of x 3 coping skills for stress with ability to engage in self-care daily within the next 90 days.   ProgressTowards Goals: Progressing  Interventions: CBT and Supportive  Summary:  Sarah Long is a 52 y.o. female who presents with dx of Bipolar disorder and PTSD with hx of alcohol use. Sarah Long presents for session alert and oriented; mood and affect adequate; low. Speech clear and coherent at normal rate and tone. Minimal engagement with therapist. Sarah Long to not be feeling well with menses to have started. Shares high degree of pain and lethargy; noting to have called out from work previous night as result. Shares ongoing difficulty sleeping and agrees to contact nurse to educate provider. Notes poor  appetite. Shares desire to take time for her self and shares plans of "vegging out" over the course of the weekend. Notes to be tired of working and awaiting disability claim. Denies excessive irritability or elevated moods but notes increase in feelings of stress. Coping with spending time with friends. Denies SI/HI  Suicidal/Homicidal: Nowithout intent/plan  Therapist Response: Therapist engaged Sarah Long in [employer]. Completed check in and assessed current level of functioning, sxs management and level of functioning. Provided safe space for Sarah Long to share concerning current stressors. Provided supportive feedback and validated feelings. Explored with Sarah Long working to establish sleep routine and schedule for self to support in reducing feelings of stress and overwhelm in day to day with work and care taking of children. Encouraged balance in daily life and balanced healthy cognitions. Reviewed session and provided follow up. No safety concerns reported.   Plan: Return again in  x 5 weeks.  Diagnosis: Bipolar 1 disorder, depressed (HCC)  PTSD (post-traumatic stress disorder)  Collaboration of Care: Other None  Patient/Guardian was advised Release of Information must be obtained prior to any record release in order to collaborate their care with an outside provider. Patient/Guardian was advised if they have not already done so to contact the registration department to sign all necessary forms in order for [place] to release information regarding their care.   Consent: Patient/Guardian gives verbal consent for treatment and assignment of benefits for services provided during this visit. Patient/Guardian expressed understanding and agreed to proceed.   Stephan Minister [place], [facility] 08/19/2023

## 2023-08-19 NOTE — Progress Notes (Signed)
PCP: [name], MD Endocrinology: Dr. Lonzo Cloud EP: Dr. Elberta Fortis Cardiology: Dr. Shirlee Latch  52 y.o. with history of type 2 diabetes, HTN, hyperlipidemia, complete heart block with PPM, and chronic diastolic CHF was referred by Dr. Delford Field for evaluation of CHF.  Patient has had diabetes since age 54 and has had HTN for at least 5 years.  She is a smoker and has a history of prior ETOH abuse with ETOH pancreatitis. In 3/21, she was admitted with DKA.  She was found to have associated complete heart block that did not resolve, she had St Jude PPM placed.  She had an echocardiogram showing EF 40-45% with severe LV hypertrophy.  She did not have a cardiac MRI prior to PPM placement.  Cath in 3/21 showed no significant CAD (nonischemic cardiomyopathy).   Last seen in 5/21, doing well but not taking any cardiac meds. Entresto started and started work up for cardiac sarcoidosis and cardiac amyloidosis.  Echo (12/22): EF 70-75%, severe LVH, normal RV, mild to moderate MR.  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 She has a functioning orthotic.   She had follow up with EP 1/23, PPM functioning appropriately. Daily lasix restarted and cMRI arranged.  cMRI (2/23) with LVEF 68%, severe LVH, ECV 54%, no LVOT gradient, poor nulling with uninterpretable delayed enhancement.   Follow up 5/23, NYHA II symptoms, volume overloaded. Lasix increase to 40 daily and genetic testing arranged to evaluate for TTR amyloidosis and hypertrophic cardiomyopathies. 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  Of note, "heart problems" run in family with mother and maternal grandparents having CHF.  Alpha galactosidase activity was low at 20.1.  She was seen for genetics counseling by Dr. 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   Echo 2/24 EF 65-70%, severe LVH, normal RV size and systolic function, GLS -6.4% (very abnormal), IVC normal.   Zio monitor in 3/24  showed 5 short SVT runs and rare PVCs.   She has been seen by Dr. Austin Miles in the Fabry's clinic at [facility].   Today she returns for HF follow up. She continues to work as a Lawyer.  Still smoking 1-2 cigarettes/day but cutting back with Chantix.  She has an occasional dry cough.  She is short of breath walking up stairs or walking long distances.  No chest pain.  She has had more orthopnea recently, sleeping on 4 pillows.  Weight up 6 lbs.   ECG (personally reviewed): NSR, RV paced  St Jude device interrogation (personally reviewed): >99% RV paced, no AF.   Labs (4/21): K 4, creatinine 0.87 Labs (2/23): K 4.4, creatinine 1.12 Labs (5/23): K 5.2, creatinine 1.14, LDL 53, HDL 42 Labs (8/23): K 4.4, creatinine 1.48 Labs (10/23): K 5.1, creatinine 1.34 Labs (12/23): K 5.1, creatinine 1.06 Labs (2/24): LDL 88, K 4.2, creatinine 1.61 Labs (4/24): K 5.0, creatinine 1.30, LDL 73 Labs (7/24): K 4.9, creatinine 1.33  PMH: 1. Type 2 diabetes: Diagnosed at age 62.  2. HTN 3. Hyperlipidemia 4. Active smoker 5. H/o ETOH pancreatitis 6. Bipolar disorder 7. Complete heart block: 3/21, associate with DKA episode.  Has St Jude PPM.  8. Chronic systolic CHF: Nonischemic cardiomyopathy.  Echo (3/21) with EF 40-45%, severe LVH, mild MR, normal RV.  - LHC (3/21) normal coronaries.  - PYP scan (6/21): Negative for TTR cardiac amyloidosis.  - cMRI (2/23): LVEF 68%, severe LVH, ECV 54%, no LVOT gradient, poor nulling  with uninterpretable delayed enhancement.  - CTA chest (7/23): No evidence for pulmonary sarcoidosis, no ILD.  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  - Alpha-galactosidase activity testing low (20.1) - Echo (2/24): EF 65-70%, severe LVH, normal RV size and systolic function, GLS -6.4% (very abnormal), IVC normal.  9. S/p left BKA (7/22). Has Phantom Limb pain. 10. CKD stage 3: Diabetic nephropathy, ?related to Fabry disease.  11. Zio monitor  (3/24): 5 short SVT runs and rare PVCs.  SH: Divorced, un-employed, prior heavy ETOH now rare, smokes 1-2 cigarettes daily.  FH: Grandfather with CHF. No known sarcoidosis or amyloidosis.   ROS: All systems reviewed and negative except as per HPI.   Current Outpatient Medications  Medication Sig Dispense Refill   ARIPiprazole (ABILIFY) 10 MG tablet Take 1 tablet (10 mg total) by mouth daily. 30 tablet 2   atorvastatin (LIPITOR) 20 MG tablet Take 1 tablet (20 mg total) by mouth daily. 90 tablet 3   carvedilol (COREG) 12.5 MG tablet Take 1 tablet (12.5 mg total) by mouth 2 (two) times daily with a meal. 120 tablet 1   Continuous Glucose Sensor (DEXCOM G7 SENSOR) MISC 1 Device by Does not apply route as directed. 9 each 3   dapagliflozin propanediol (FARXIGA) 10 MG TABS tablet Take 1 tablet (10 mg total) by mouth daily before breakfast. 30 tablet 11   furosemide (LASIX) 20 MG tablet Take 2 tablets (40 mg total) by mouth daily.     GALAFOLD 123 MG CAPS Take by mouth.     hydrALAZINE (APRESOLINE) 50 MG tablet Take one three times daily 180 tablet 1   Insulin Disposable Pump (OMNIPOD 5 G7 INTRO, GEN 5,) KIT 1 Device by Does not apply route every other day. 1 kit 0   Insulin Disposable Pump (OMNIPOD 5 G7 PODS, GEN 5,) MISC 1 Device by Does not apply route every other day. 45 each 3   insulin glargine (LANTUS SOLOSTAR) 100 UNIT/ML Solostar Pen Inject 60 Units into the skin daily. 60 mL 3   Insulin Pen Needle 31G X 5 MM MISC Use as directed in the morning, at noon, in the evening, and at bedtime. 400 each 3   isosorbide mononitrate (ISMO) 20 MG tablet Take 1 tablet (20 mg total) by mouth daily. 90 tablet 3   latanoprost (XALATAN) 0.005 % ophthalmic solution Place 1 drop into both eyes at bedtime.     valsartan (DIOVAN) 320 MG tablet Take 1 tablet (320 mg total) by mouth daily. 90 tablet 3   varenicline (CHANTIX CONTINUING MONTH PAK) 1 MG tablet Take 1 tablet (1 mg total) by mouth 2 (two) times daily.  60 tablet 1   No current facility-administered medications for this visit.   Wt Readings from Last 3 Encounters:  08/16/23 97.7 kg (215 lb 6.4 oz)  07/22/23 99.6 kg (219 lb 9.6 oz)  07/22/23 98.9 kg (218 lb)   There were no vitals taken for this visit. General: NAD Neck: Thick, JVP 8 cm, no thyromegaly or thyroid nodule.  Lungs: Clear to auscultation bilaterally with normal respiratory effort. CV: Nondisplaced PMI.  Heart regular S1/S2, no S3/S4, 2/6 early SEM RUSB.  No peripheral edema.  No carotid bruit.  Normal pedal pulses.  Abdomen: Soft, nontender, no hepatosplenomegaly, no distention.  Skin: Intact without lesions or rashes.  Neurologic: Alert and oriented x 3.  Psych: Normal affect. Extremities: No clubbing or cyanosis.  HEENT: Normal.   Assessment/Plan: 1. Chronic  systolic CHF: Echo in 3/21 with EF 40-45%, severe LV hypertrophy.  This was found in association with complete heart block.  Nonischemic cardiomyopathy, cath in 3/21 showed no significant CAD.  PYP scan (6/21) not suggestive of TTR amyloidosis. cMRI (2/23) showed LVEF 68%, severe LVH, ECV 54%, no LVOT gradient, poor nulling with uninterpretable delayed enhancement. CTA chest did not show evidence for pulmonary sarcoidosis.  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  There is wide phenotypic variation in female Fabry's heterozygotes, but I suspect that the cardiomyopathy with LVH and h/o complete heart block in this situation is most likely due to Fabry's disease. Echo 2/24 showed EF 65-70%, severe LVH, normal RV size and systolic function, GLS -6.4% (very abnormal), IVC normal.  NYHA class II-III symptoms, mild volume overload on exam with weight up.  - She has seen Dr. Austin Miles at [facility] for Fabry's evaluation. They are working on starting her on migalastat, an alpha-galactosidase A pharmacological chaperone.  Her alpha-gal  variant leads to abnormal folding/less stable enzyme amenable to treatment with migalastat which should stabilize with enzyme and direct it to lysosome.   - Continue hydralazine 25 mg tid + Imdur 20 mg daily.  - Continue Farxiga 10 mg daily.  - Increase Lasix to 40 qam/20 qpm.  BMET/BNP today and BMET in 10 days.  - Continue Coreg 12.5 mg bid. - Continue valsartan 320 mg daily.  2. HTN: BP controlled.  3. Hyperlipidemia: Check lipids on atorvastatin.  4. Smoking: Smoking 1-2 cigs/day. Discussed cessation. - Continue Chantix. 5. Type 2 diabetes: Followed by Endocrinology. She is on insulin. 6. Complete heart block: She has a St Jude PPM and is pacing her RV > 99% of the time.  This is likely related to Fabry's disease.  7. Obesity: There is no height or weight on file to calculate BMI. - Would avoid GLP-1 agonist with history of pancreatitis.   Follow up 1 month with APP  Anderson Malta [facility]  08/19/2023

## 2023-08-22 ENCOUNTER — Ambulatory Visit (HOSPITAL_COMMUNITY)
Admission: RE | Admit: 2023-08-22 | Discharge: 2023-08-22 | Disposition: A | Discharge status: Home / Self Care | Payer: Medicaid Other | Source: Ambulatory Visit | Attending: Family Medicine | Admitting: Family Medicine

## 2023-08-22 ENCOUNTER — Encounter (HOSPITAL_COMMUNITY): Payer: Self-pay

## 2023-08-22 ENCOUNTER — Other Ambulatory Visit: Payer: Self-pay

## 2023-08-22 VITALS — BP 130/70 | HR 83 | Wt 215.0 lb

## 2023-08-22 DIAGNOSIS — Z794 Long term (current) use of insulin: Secondary | ICD-10-CM | POA: Insufficient documentation

## 2023-08-22 DIAGNOSIS — Z79899 Other long term (current) drug therapy: Secondary | ICD-10-CM | POA: Diagnosis not present

## 2023-08-22 DIAGNOSIS — Z87891 Personal history of nicotine dependence: Secondary | ICD-10-CM | POA: Insufficient documentation

## 2023-08-22 DIAGNOSIS — E785 Hyperlipidemia, unspecified: Secondary | ICD-10-CM | POA: Insufficient documentation

## 2023-08-22 DIAGNOSIS — E669 Obesity, unspecified: Secondary | ICD-10-CM | POA: Insufficient documentation

## 2023-08-22 DIAGNOSIS — Z7984 Long term (current) use of oral hypoglycemic drugs: Secondary | ICD-10-CM | POA: Diagnosis not present

## 2023-08-22 DIAGNOSIS — E1122 Type 2 diabetes mellitus with diabetic chronic kidney disease: Secondary | ICD-10-CM | POA: Insufficient documentation

## 2023-08-22 DIAGNOSIS — I428 Other cardiomyopathies: Secondary | ICD-10-CM | POA: Diagnosis not present

## 2023-08-22 DIAGNOSIS — Z6836 Body mass index (BMI) 36.0-36.9, adult: Secondary | ICD-10-CM | POA: Diagnosis not present

## 2023-08-22 DIAGNOSIS — E1169 Type 2 diabetes mellitus with other specified complication: Secondary | ICD-10-CM

## 2023-08-22 DIAGNOSIS — I442 Atrioventricular block, complete: Secondary | ICD-10-CM | POA: Diagnosis not present

## 2023-08-22 DIAGNOSIS — Z720 Tobacco use: Secondary | ICD-10-CM | POA: Diagnosis not present

## 2023-08-22 DIAGNOSIS — I130 Hypertensive heart and chronic kidney disease with heart failure and stage 1 through stage 4 chronic kidney disease, or unspecified chronic kidney disease: Secondary | ICD-10-CM | POA: Insufficient documentation

## 2023-08-22 DIAGNOSIS — I5022 Chronic systolic (congestive) heart failure: Secondary | ICD-10-CM | POA: Diagnosis not present

## 2023-08-22 DIAGNOSIS — I10 Essential (primary) hypertension: Secondary | ICD-10-CM

## 2023-08-22 MED ORDER — OMNIPOD 5 G7 INTRO (GEN 5) KIT: 1.0000 | PACK | 0 refills | Status: DC

## 2023-08-22 MED ORDER — DEXCOM G7 SENSOR MISC: 1.0000 | 3 refills | Status: DC

## 2023-08-22 MED ORDER — OMNIPOD 5 G7 PODS (GEN 5) MISC: 1.0000 | 3 refills | Status: DC

## 2023-08-22 NOTE — Patient Instructions (Addendum)
No change in medications. Please call Dr. Gershon Crane office to replace your PPM transmitter. Return to see Dr. Shirlee Latch in four months. Please call our office at [phone] in December to schedule this appointment. Please call us at [phone] if any questions or concerns prior to your next visit.   Dr. Loman Brooklyn Address: [address], [place], [place] [address] Phone: [phone]

## 2023-08-25 DIAGNOSIS — E7521 Fabry (-Anderson) disease: Secondary | ICD-10-CM | POA: Diagnosis not present

## 2023-08-26 ENCOUNTER — Encounter
Payer: Medicaid Other | Attending: Physical Medicine and Rehabilitation | Admitting: Physical Medicine and Rehabilitation

## 2023-08-26 VITALS — BP 106/68 | HR 83 | Ht 64.0 in | Wt 218.0 lb

## 2023-08-26 DIAGNOSIS — N943 Premenstrual tension syndrome: Secondary | ICD-10-CM | POA: Diagnosis not present

## 2023-08-26 DIAGNOSIS — G546 Phantom limb syndrome with pain: Secondary | ICD-10-CM | POA: Diagnosis not present

## 2023-08-26 DIAGNOSIS — R600 Localized edema: Secondary | ICD-10-CM | POA: Diagnosis not present

## 2023-08-26 NOTE — Progress Notes (Signed)
Subjective:    Patient ID: Sarah Long, female    DOB: Jul 13, 1971, 52 y.o.   MRN: [id number]  HPI Sarah Long is a 52 year old woman who presents for f/u  left BKA phantom limb pain, menstrual pain.   1) Type 2 DM -she has an appointment with an endocrinologist -has a dexcom and omnipod to given her 2U insulin every hour -CBGs have been very well controlled.  -she has tingling and numbness in her right foot -she is getting phantom limb pain in left leg.  -she has follow-up with Dr. Lajoyce Corners  and PCP -she is willing to try nerve medications -she has neuropathic pain throughout her right lower extremity -does not require any refills today -she still has neuropathy in her other foot.   2) Constipation: -she does not lie the Miralax -she is worried about being constipated wit the iron.   3) Left BKA phantom limb pain -has started again -trialed Tens unit for her today -Qutenza was very helpful in the past and resulted I complete relief but it burned severely when she went home so she prefers another option -tolerating prosthesis well  4) Right lower extremity edema -compression garments ordered by Dr. Lajoyce Corners -she does not like how much the lasix makes her urinate -does not require any refills today -improved with lasix  5) Menarche -returned and can be painful -like clockwark the week before she started feeling pain week before   6) HTN -BP up to 151/84 today  7) Neuropathy in right foot  -she has pain there as well  Pain Inventory Average Pain 7 Pain Right Now 5 My pain is sharp, stabbing, tingling, and aching  In the last 24 hours, has pain interfered with the following? General activity 5 Relation with others 7 Enjoyment of life 5 What TIME of day is your pain at its worst? Morning daytime evening night Sleep (in general) Fair  Pain is worse with: sitting, inactivity, and some activites bending Pain improves with: medication Relief from Meds: 4     Family  History  Problem Relation Age of Onset   Hypertension Mother    Diabetes Mother    Heart disease Mother    Hypertension Father    Diabetes Father    Heart disease Father    Stroke Maternal Grandfather    Depression Maternal Grandmother    Suicidality Maternal Grandmother    Schizophrenia Paternal Grandmother    Other Neg Hx    Breast cancer Neg Hx    Social History   Socioeconomic History   Marital status: Significant Other    Spouse name: Not on file   Number of children: Not on file   Years of education: Not on file   Highest education level: Some college, no degree  Occupational History   Not on file  Tobacco Use   Smoking status: Every Day    Current packs/day: 0.00    Average packs/day: 0.5 packs/day for 20.0 years (10.0 ttl pk-yrs)    Types: Cigarettes    Start date: 07/01/2001    Last attempt to quit: 07/01/2021    Years since quitting: 2.1   Smokeless tobacco: Never   Tobacco comments:    1. Centrix to stop smoking   Vaping Use   Vaping status: Never Used  Substance and Sexual Activity   Alcohol use: Yes    Comment: 2-5 drinks in a sitting about once every 2 weeks; history of excessive use   Drug use: No  Sexual activity: Yes    Partners: Female, Female    Birth control/protection: None  Other Topics Concern   Not on file  Social History Narrative   Not on file   Social Determinants of Health   Financial Resource Strain: High Risk (07/12/2023)   Overall Financial Resource Strain (CARDIA)    Difficulty of Paying Living Expenses: Very hard  Food Insecurity: Food Insecurity Present (08/10/2023)   Hunger Vital Sign    Worried About Running Out of Food in the Last Year: Sometimes true    Ran Out of Food in the Last Year: Sometimes true  Transportation Needs: Unmet Transportation Needs (08/10/2023)   PRAPARE - Administrator, Civil Service (Medical): Yes    Lack of Transportation (Non-Medical): No  Physical Activity: Insufficiently Active (04/01/2023)    Exercise Vital Sign    Days of Exercise per Week: 3 days    Minutes of Exercise per Session: 20 min  Stress: Stress Concern Present (04/01/2023)   [employer] of Occupational Health - Occupational Stress Questionnaire    Feeling of Stress : Very much  Social Connections: Moderately Isolated (04/01/2023)   Social Connection and Isolation Panel [NHANES]    Frequency of Communication with Friends and Family: Once a week    Frequency of Social Gatherings with Friends and Family: Once a week    Attends Religious Services: More than 4 times per year    Active Member of Clubs or Organizations: Yes    Attends Banker Meetings: More than 4 times per year    Marital Status: Divorced   Past Surgical History:  Procedure Laterality Date   ABDOMINAL AORTOGRAM W/LOWER EXTREMITY N/A 06/17/2021   Procedure: ABDOMINAL AORTOGRAM W/LOWER EXTREMITY;  Surgeon: Iran Ouch, MD;  Location: [facility];  Service: Cardiovascular;  Laterality: N/A;   AMPUTATION Left 06/27/2021   Procedure: AMPUTATION BELOW KNEE;  Surgeon: Nadara Mustard, MD;  Location: [facility] OR;  Service: Orthopedics;  Laterality: Left;   AMPUTATION Right 09/04/2021   Procedure: RIGHT GREAT TOE AMPUTATION;  Surgeon: Nadara Mustard, MD;  Location: [facility] OR;  Service: Orthopedics;  Laterality: Right;   CESAREAN SECTION  1991   LEFT HEART CATH AND CORONARY ANGIOGRAPHY N/A 02/10/2020   Procedure: LEFT HEART CATH AND CORONARY ANGIOGRAPHY;  Surgeon: Lennette Bihari, MD;  Location: [facility];  Service: Cardiovascular;  Laterality: N/A;   PACEMAKER IMPLANT N/A 02/13/2020   Procedure: PACEMAKER IMPLANT;  Surgeon: Regan Lemming, MD;  Location: [facility];  Service: Cardiovascular;  Laterality: N/A;   TEMPORARY PACEMAKER N/A 02/10/2020   Procedure: TEMPORARY PACEMAKER;  Surgeon: Lennette Bihari, MD;  Location: [facility] INVASIVE CV LAB;  Service: Cardiovascular;  Laterality: N/A;   Past Medical History:  Diagnosis Date    Abnormal Pap smear 1998   Abnormal vaginal bleeding 12/19/2011   Acute urinary retention 06/26/2021   Arthritis    Bacterial infection    Bipolar 1 disorder (HCC)    Blister of second toe of left foot 11/21/2016   Candida vaginitis 07/2007   Depression    recently added wellbutrin-has not taken yet for bipolar   Diabetes in pregnancy    Diabetes mellitus    nph 20U qam and qpm, regular with meals   Diabetic ketoacidosis without coma associated with type 2 diabetes mellitus (HCC)    Fibroid    Galactorrhea of right breast 2008   GERD (gastroesophageal reflux disease)    H/O amenorrhea  07/2007   H/O dizziness 10/14/2011   H/O dysmenorrhea 2010   H/O menorrhagia 10/14/2011   H/O varicella    Headache(784.0)    Heavy vaginal bleeding due to contraceptive injection use 10/12/2011   Depo Provera   Herpes    Homelessness 11/19/2021   HSV-2 infection 01/03/2009   Hx: UTI (urinary tract infection) 2009   Hypertension    on aldomet   Hypoalbuminemia due to protein-calorie malnutrition (HCC) 06/26/2021   Increased BMI 2010   Irregular uterine bleeding 04/04/2012   Pt has mirena    Obesity 10/14/2011   Oligomenorrhea 07/2007   Osteomyelitis of great toe of right foot (HCC) 03/22/2022   Pelvic pain in female    Presence of permanent cardiac pacemaker    Preterm labor    PTSD (post-traumatic stress disorder)    Trichomonas    Yeast infection    BP 106/68   Pulse 83   Ht 5\' 4"  (1.626 m)   Wt 218 lb (98.9 kg)   SpO2 97%   BMI 37.42 kg/m   Opioid Risk Score:   Fall Risk Score:  `1  Depression screen [facility] 2/9     08/16/2023    8:55 AM 07/22/2023   10:09 AM 06/28/2023    9:22 AM 05/13/2023   12:54 PM 04/27/2023    8:51 AM 03/15/2023    9:30 AM 01/13/2023   10:07 AM  Depression screen PHQ 2/9  Decreased Interest 0 0 0  3 2 2   Down, Depressed, Hopeless 0 0 0  3 1 2   PHQ - 2 Score 0 0 0  6 3 4   Altered sleeping 0  3  3 3 3   Tired, decreased energy 2  3  3 1 3   Change in  appetite 1  0  3 0 2  Feeling bad or failure about yourself  0  0  2 0 0  Trouble concentrating 0  1  2 0 1  Moving slowly or fidgety/restless 0  0  0 0 0  Suicidal thoughts 0  0  0 0 0  PHQ-9 Score 3  7  19 7 13   Difficult doing work/chores            Information is confidential and restricted. Go to Review Flowsheets to unlock data.      Review of Systems  Constitutional: Negative.   HENT: Negative.    Eyes: Negative.   Respiratory: Negative.    Cardiovascular: Negative.   Gastrointestinal: Negative.   Endocrine: Negative.   Genitourinary: Negative.   Musculoskeletal:        Pain in left leg  Skin: Negative.   Allergic/Immunologic: Negative.   Neurological:  Positive for weakness and numbness.  Hematological: Negative.   Psychiatric/Behavioral:  Positive for dysphoric mood. The patient is nervous/anxious.   All other systems reviewed and are negative.      Objective:   Physical Exam Gen: no distress, normal appearing, weight 218 lbs, BMI 37.42, BMI 106/68 HEENT: oral mucosa pink and moist, NCAT Cardio: Reg rate Chest: normal effort, normal rate of breathing Abd: soft, non-distended Ext: no edema Psych: pleasant, normal affect Skin: intact Musculoskeletal: Left BKA healed well, ambulating with prosthetic well. Amputated digits of right foot    Assessment & Plan:  Mrs. Mcninch is a 52 year old woman who presents for hospital follow-up after CIR admission for left BKA  1) L BKA with phantom limb pain -continue follow-up with Dr. Lajoyce Corners, healing well D/c gabapentin  Prescribing Home Zynex NexWave Stimulator Device and supplies as needed. IFC, NMES and TENS medically necessary Treatment Rx: Daily @ 30-40 minutes per treatment PRN. Zynex NexWave only, no substitutions. Treatment Goals: 1) To reduce and/or eliminate pain 2) To improve functional capacity and Activities of daily living 3) To reduce or prevent the need for oral medications 4) To improve circulation in the  injured region 5) To decrease or prevent muscle spasm and muscle atrophy 6) To provide a self-management tool to the patient The patient has not sufficiently improved with conservative care. Numerous studies indexed by Medline and PubMed.gov have shown Neuromuscular, Interferential, and TENS stimulators to reduce pain, improve function, and reduce medication use in injured patients. Continued use of this evidence based, safe, drug free treatment is both reasonable and medically necessary at this time.   -Discussed Qutenza as an option for neuropathic pain control. Discussed that this is a capsaicin patch, stronger than capsaicin cream. Discussed that it is currently approved for diabetic peripheral neuropathy and post-herpetic neuralgia, but that it has also shown benefit in treating other forms of neuropathy. Provided patient with link to site to learn more about the patch: https://www.clark.biz/. Discussed that the patch would be placed in office and benefits usually last 3 months. Discussed that unintended exposure to capsaicin can cause severe irritation of eyes, mucous membranes, respiratory tract, and skin, but that Qutenza is a local treatment and does not have the systemic side effects of other nerve medications. Discussed that there may be pain, itching, erythema, and decreased sensory function associated with the application of Qutenza. Side effects usually subside within 1 week. A cold pack of analgesic medications can help with these side effects. Blood pressure can also be increased due to pain associated with administration of the patch.  -will complete social security forms, asked that these be resent -discussed her current challenges at work and that she is currently working three hours per day. Discussed her difficulty doing stairs, shortness of breath -Provided with a pain relief journal and discussed that it contains foods and lifestyle tips to naturally help to improve pain. Discussed  that these lifestyle strategies are also very good for health unlike some medications which can have negative side effects. Discussed that the act of keeping a journal can be therapeutic and helpful to realize patterns what helps to trigger and alleviate pain.   Prescribed Zynex Nexwave -discussed that she cannot take ibuprofen due to her heart disease -discussed that she can try increasing tylenol to 1,000mg  TID  2) Type 2 DM with peripheral neuropathy- improved -referred to PT for mirror therapy -improved, d/c qutenza.  -will look into medically tailored meals for her.  -Recommended 1 glass water with 1 TB apple cider vinegar before meals to reduce CBG spike, has additional health benefits, drink with straw to protect enamel.  -discussed her difficulty getting food from her family.  Discussed that honey has been shown to help diabetes, but that it does have fructose in it.    2) Anemia -discussed what it means to be anemic -discussed sources of iron rih fods.   3)Constipation:  -Provided list of following foods that help with constipation and highlighted a few: 1) prunes- contain high amounts of fiber.  2) apples- has a form of dietary fiber called pectin that accelerates stool movement and increases beneficial gut bacteria 3) pears- in addition to fiber, also high in fructose and sorbitol which have laxative effect 4) figs- contain an enzyme ficin which helps to speed  colonic transit 5) kiwis- contain an enzyme actinidin that improves gut motility and reduces constipation 6) oranges- rich in pectin (like apples) 7) grapefruits- contain a flavanol naringenin which has a laxative effect 8) vegetables- rich in fiber and also great sources of folate, vitamin C, and K 9) artichoke- high in inulin, prebiotic great for the microbiome 10) chicory- increases stool frequency and softness (can be added to coffee) 11) rhubarb- laxative effect 12) sweet potato- high fiber 13) beans, peas, and  lentils- contain both soluble and insoluble fiber 14) chia seeds- improves intestinal health and gut flora 15) flaxseeds- laxative effect 16) whole grain rye bread- high in fiber 17) oat bran- high in soluble and insoluble fiber 18) kefir- softens stools -recommended to try at least one of these foods every day.  -drink 6-8 glasses of water per day -walk regularly, especially after meals.   4) HTN: -BP is 151/84 today.  -Advised checking BP daily at home and logging results to bring into follow-up appointment with PCP and myself. -Reviewed BP meds today.  -Advised regarding healthy foods that can help lower blood pressure and provided with a list: 1) citrus foods- high in vitamins and minerals 2) salmon and other fatty fish - reduces inflammation and oxylipins 3) swiss chard (leafy green)- high level of nitrates 4) pumpkin seeds- one of the best natural sources of magnesium 5) Beans and lentils- high in fiber, magnesium, and potassium 6) Berries- high in flavonoids 7) Amaranth (whole grain, can be cooked similarly to rice and oats)- high in magnesium and fiber 8) Pistachios- even more effective at reducing BP than other nuts 9) Carrots- high in phenolic compounds that relax blood vessels and reduce inflammation 10) Celery- contain phthalides that relax tissues of arterial walls 11) Tomatoes- can also improve cholesterol and reduce risk of heart disease 12) Broccoli- good source of magnesium, calcium, and potassium 13) Greek yogurt: high in potassium and calcium 14) Herbs and spices: Celery seed, cilantro, saffron, lemongrass, black cumin, ginseng, cinnamon, cardamom, sweet basil, and ginger 15) Chia and flax seeds- also help to lower cholesterol and blood sugar 16) Beets- high levels of nitrates that relax blood vessels  17) spinach and bananas- high in potassium  -Provided lise of supplements that can help with hypertension:  1) magnesium: one high quality brand is Bioptemizers  since it contains all 7 types of magnesium, otherwise over the counter magnesium gluconate 400mg  is a good option 2) B vitamins 3) vitamin D 4) potassium 5) CoQ10 6) L-arginine 7) Vitamin C 8) Beetroot -Educated that goal BP is 120/80. -Made goal to incorporate some of the above foods into diet.    5) Allergies -recommended bee pollen   6) Obesity: -Educated that current weight is 215 lbs and current BMI is 36.90 -Educated regarding health benefits of weight loss- for pain, general health, chronic disease prevention, immune health, mental health.  -Will monitor weight every visit.  -Consider Roobois tea daily.  -Discussed the benefits of intermittent fasting. -Discussed foods that can assist in weight loss: 1) leafy greens- high in fiber and nutrients 2) dark chocolate- improves metabolism (if prefer sweetened, best to sweeten with honey instead of sugar).  3) cruciferous vegetables- high in fiber and protein 4) full fat yogurt: high in healthy fat, protein, calcium, and probiotics 5) apples- high in a variety of phytochemicals 6) nuts- high in fiber and protein that increase feelings of fullness 7) grapefruit: rich in nutrients, antioxidants, and fiber (not to be taken with anticoagulation) 8)  beans- high in protein and fiber 9) salmon- has high quality protein and healthy fats 10) green tea- rich in polyphenols 11) eggs- rich in choline and vitamin D 12) tuna- high protein, boosts metabolism 13) avocado- decreases visceral abdominal fat 14) chicken (pasture raised): high in protein and iron 15) blueberries- reduce abdominal fat and cholesterol 16) whole grains- decreases calories retained during digestion, speeds metabolism 17) chia seeds- curb appetite 18) chilies- increases fat metabolism  -Discussed supplements that can be used:  1) Metatrim 400mg  BID 30 minutes before breakfast and dinner  2) Sphaeranthus indicus and Garcinia mangostana (combinations of these and #1 can  be found in capsicum and zychrome  3) green coffee bean extract 400mg  twice per day or Irvingia (african mango) 150 to 300mg  twice per day.  7) Bilateral lower extremity edema 2/2 CHF: -continue Farxiga and Lasix -discussed that she has been cleared my Dr. Sherlie Ban to use Nexwave  8) Premenstrual pain: -discussed using saffron daily, discussed that she can put a small thready in what she is eating every day -discussed that she is hopeful for menopause soon  9) Morbid obesity: -discussed that she eats a lot of fast food currently -discussed that she has a girlfriend that does charcuterie  -continue tylenol and ibuprofen prn

## 2023-08-26 NOTE — Patient Instructions (Signed)
saffron

## 2023-09-03 DIAGNOSIS — G546 Phantom limb syndrome with pain: Secondary | ICD-10-CM | POA: Diagnosis not present

## 2023-09-14 ENCOUNTER — Other Ambulatory Visit: Payer: Medicare Other | Admitting: *Deleted

## 2023-09-14 NOTE — Patient Instructions (Signed)
Visit Information  Ms. [name]  - as a part of your Medicaid benefit, you are eligible for care management and care coordination services at no cost or copay. I was unable to reach you by phone today but would be happy to help you with your health related needs. Please feel free to call me @ [phone].   A member of the Managed Medicaid care management team will reach out to you again over the next 7 days.   [name] RN, BSN [place]  [facility] RN Care Coordinator [phone]

## 2023-09-14 NOTE — Patient Outreach (Signed)
Medicaid Managed Care   Unsuccessful Attempt Note   09/14/2023 Name: Sarah Long MRN: [id number] DOB: July 18, 1971  Referred by: [name], MD Reason for referral : High Risk Managed Medicaid (Unsuccessful RNCM follow up telephone outreach)   An unsuccessful telephone outreach was attempted today. The patient was referred to the case management team for assistance with care management and care coordination.    Follow Up Plan: A HIPAA compliant phone message was left for the patient providing contact information and requesting a return call. and The Managed Medicaid care management team will reach out to the patient again over the next 7 days.    [name] RN, BSN [place]  [facility] RN Care Coordinator [phone]

## 2023-09-19 ENCOUNTER — Telehealth: Payer: Self-pay

## 2023-09-19 NOTE — Telephone Encounter (Signed)
[name]  Medicaid Managed Care   Unsuccessful Outreach Note  09/19/2023 Name: Sarah Long MRN: [id number] DOB: [birth year]  Referred by: [name], MD Reason for referral : Appointment (I called the patient today to get her missed phone appointment with the MM RNCM rescheduled. I had to leave a message on her VM.)   A second unsuccessful telephone outreach was attempted today. The patient was referred to the case management team for assistance with care management and care coordination.   Follow Up Plan: A HIPAA compliant phone message was left for the patient providing contact information and requesting a return call.  The care management team will reach out to the patient again over the next 7 days.   Weston Settle Care Guide  [facility] Managed  Care Guide [facility]  [phone]

## 2023-09-20 ENCOUNTER — Encounter: Payer: Self-pay | Admitting: Cardiology

## 2023-09-22 ENCOUNTER — Telehealth: Payer: Self-pay | Admitting: Dietician

## 2023-09-22 ENCOUNTER — Telehealth (HOSPITAL_COMMUNITY): Payer: Self-pay

## 2023-09-22 NOTE — Telephone Encounter (Signed)
Medication management - Prior authorization for patient's prescribed increased Abilify 10 mg, one a day, #30 for 30 days submitted online with CoverMyMeds and sent to patient's [employer]  Medicaid for review with [employer]. Decision pending.

## 2023-09-22 NOTE — Telephone Encounter (Signed)
Returned patient call. She states that she got a new PDM (Omnipod 5) as her old one was not holding a charge and she also just received PODS that are Dexcom G7 compatible.  She has not worn her pump in 2 weeks as she did not have the proper PODs.   Stated that she will need to enter the settings into her new PDM. Provided the settings from the 04/06/2023 note.  It does not appear that there have been any pump setting changes since that time.  Pump settings proved: Basal rate 2.0 units/hr ICR:  1 (patient does not carb count) ISF:  25 - target 110 with correction >110 Max bolus 25 Max basal 4  Insulin action 4 hours  She does not know her omnipod password to access her account.  She was instructed to call tech support who can help her with this and pump set up if needed.  She is to call for further questions.  Oran Rein, RD, LDN, CDCES

## 2023-09-27 ENCOUNTER — Encounter: Payer: Medicare Other | Attending: Internal Medicine | Admitting: Nutrition

## 2023-09-27 DIAGNOSIS — E1165 Type 2 diabetes mellitus with hyperglycemia: Secondary | ICD-10-CM | POA: Insufficient documentation

## 2023-09-27 DIAGNOSIS — Z794 Long term (current) use of insulin: Secondary | ICD-10-CM | POA: Diagnosis not present

## 2023-09-27 NOTE — Progress Notes (Signed)
Patient was sent a new PDM because old one did not hold a charge.  She found out later that she was using the wrong cord for charging her PDM and so did not need the new PDM.  She wanted the settings put into the new PDM in case she needed it at a future date.  Settings were put into the PDM per last office note: Basal rate 2.0 units/hr ICR:  1 (patient does not carb count) ISF:  25 - target 110 with correction >110 Max bolus 25 Max basal 4  Insulin action 4 hours

## 2023-09-28 ENCOUNTER — Ambulatory Visit (INDEPENDENT_AMBULATORY_CARE_PROVIDER_SITE_OTHER): Payer: Medicare Other | Admitting: Mental Health

## 2023-09-28 DIAGNOSIS — F1090 Alcohol use, unspecified, uncomplicated: Secondary | ICD-10-CM

## 2023-09-28 DIAGNOSIS — F4310 Post-traumatic stress disorder, unspecified: Secondary | ICD-10-CM

## 2023-09-28 DIAGNOSIS — F319 Bipolar disorder, unspecified: Secondary | ICD-10-CM

## 2023-09-28 NOTE — Progress Notes (Unsigned)
   THERAPIST PROGRESS NOTE  Session Time: 3:00 pm ( 51 minutes)  Participation Level: Active  Behavioral Response: CasualAlertEuthymic  Type of Therapy: Individual Therapy  Treatment Goals addressed: STG: Markasia will increase ability to cope with stressors AEB development of x 3 coping skills for stress with ability to engage in self-care daily within the next 90 days.   ProgressTowards Goals: Progressing  Interventions: CBT and Supportive  Summary: Sarah Long is a 52 y.o. female who presents with dx of Bipolar disorder and PTSD with hx of alcohol use. [name] presents for session alert and oriented; mood and affect adequate; low. Speech clear and coherent at normal rate and tone. Minimal engagement with therapist. [name] to not be feeling well    Suicidal/Homicidal: Nowithout intent/plan  Therapist Response: Therapist engaged [name] in tele-therapy session. Completed check in and assessed current level of functioning, sxs management and level of functioning. Provided safe space for [name] to share concerning current stressors. Provided supportive feedback and validated feelings. Explored   Plan: Return again in  x 6 weeks.  Diagnosis: Bipolar 1 disorder, depressed (HCC)  PTSD (post-traumatic stress disorder)  Alcohol use disorder  Collaboration of Care: Other N\one  Patient/Guardian was advised Release of Information must be obtained prior to any record release in order to collaborate their care with an outside provider. Patient/Guardian was advised if they have not already done so to contact the registration department to sign all necessary forms in order for [place] to release information regarding their care.   Consent: Patient/Guardian gives verbal consent for treatment and assignment of benefits for services provided during this visit. Patient/Guardian expressed understanding and agreed to proceed.   Stephan Minister [place], [facility] 09/28/2023

## 2023-10-02 ENCOUNTER — Encounter (HOSPITAL_COMMUNITY): Payer: Self-pay | Admitting: Pharmacy Technician

## 2023-10-02 ENCOUNTER — Emergency Department (HOSPITAL_COMMUNITY): Payer: Medicare Other

## 2023-10-02 ENCOUNTER — Other Ambulatory Visit: Payer: Self-pay

## 2023-10-02 ENCOUNTER — Emergency Department (HOSPITAL_COMMUNITY)
Admission: EM | Admit: 2023-10-02 | Discharge: 2023-10-02 | Disposition: A | Discharge status: Home / Self Care | Payer: Medicare Other | Attending: Emergency Medicine | Admitting: Emergency Medicine

## 2023-10-02 DIAGNOSIS — E119 Type 2 diabetes mellitus without complications: Secondary | ICD-10-CM | POA: Insufficient documentation

## 2023-10-02 DIAGNOSIS — R079 Chest pain, unspecified: Secondary | ICD-10-CM | POA: Insufficient documentation

## 2023-10-02 DIAGNOSIS — R2243 Localized swelling, mass and lump, lower limb, bilateral: Secondary | ICD-10-CM | POA: Insufficient documentation

## 2023-10-02 DIAGNOSIS — R0602 Shortness of breath: Secondary | ICD-10-CM | POA: Insufficient documentation

## 2023-10-02 DIAGNOSIS — Z794 Long term (current) use of insulin: Secondary | ICD-10-CM | POA: Insufficient documentation

## 2023-10-02 DIAGNOSIS — I110 Hypertensive heart disease with heart failure: Secondary | ICD-10-CM | POA: Diagnosis not present

## 2023-10-02 DIAGNOSIS — I509 Heart failure, unspecified: Secondary | ICD-10-CM | POA: Insufficient documentation

## 2023-10-02 DIAGNOSIS — Z79899 Other long term (current) drug therapy: Secondary | ICD-10-CM | POA: Diagnosis not present

## 2023-10-02 DIAGNOSIS — J9811 Atelectasis: Secondary | ICD-10-CM | POA: Diagnosis not present

## 2023-10-02 LAB — BASIC METABOLIC PANEL
Anion gap: 9 (ref 5–15)
BUN: 25 mg/dL — ABNORMAL HIGH (ref 6–20)
CO2: 21 mmol/L — ABNORMAL LOW (ref 22–32)
Calcium: 8.1 mg/dL — ABNORMAL LOW (ref 8.9–10.3)
Chloride: 103 mmol/L (ref 98–111)
Creatinine, Ser: 1.52 mg/dL — ABNORMAL HIGH (ref 0.44–1.00)
GFR, Estimated: 42 mL/min — ABNORMAL LOW (ref >60)
Glucose, Bld: 330 mg/dL — ABNORMAL HIGH (ref 70–99)
Potassium: 4 mmol/L (ref 3.5–5.1)
Sodium: 133 mmol/L — ABNORMAL LOW (ref 135–145)

## 2023-10-02 LAB — CBC
HCT: 25.5 % — ABNORMAL LOW (ref 36.0–46.0)
Hemoglobin: 8 g/dL — ABNORMAL LOW (ref 12.0–15.0)
MCH: 28.8 pg (ref 26.0–34.0)
MCHC: 31.4 g/dL (ref 30.0–36.0)
MCV: 91.7 fL (ref 80.0–100.0)
Platelets: 230 10*3/uL (ref 150–400)
RBC: 2.78 MIL/uL — ABNORMAL LOW (ref 3.87–5.11)
RDW: 15.1 % (ref 11.5–15.5)
WBC: 7 10*3/uL (ref 4.0–10.5)
nRBC: 0 % (ref 0.0–0.2)

## 2023-10-02 LAB — TROPONIN I (HIGH SENSITIVITY)
Troponin I (High Sensitivity): 55 ng/L — ABNORMAL HIGH (ref <18)
Troponin I (High Sensitivity): 51 ng/L — ABNORMAL HIGH (ref <18)

## 2023-10-02 LAB — BRAIN NATRIURETIC PEPTIDE: B Natriuretic Peptide: 353.8 pg/mL — ABNORMAL HIGH (ref 0.0–100.0)

## 2023-10-02 LAB — HCG, SERUM, QUALITATIVE: Preg, Serum: NEGATIVE

## 2023-10-02 MED ORDER — FUROSEMIDE 10 MG/ML IJ SOLN
40.0000 mg | Freq: Once | INTRAMUSCULAR | Status: AC
  Administered 2023-10-02: 40 mg via INTRAVENOUS
  Filled 2023-10-02: qty 4

## 2023-10-02 NOTE — ED Provider Notes (Signed)
[place] EMERGENCY DEPARTMENT AT [facility] Provider Note   CSN: [id number] Arrival date & time: 10/02/23  1813     History  Chief Complaint  Patient presents with   Chest Pain   Shortness of Breath    Sarah Long is a 52 y.o. female history of systolic heart failure, cardiac amyloidosis, bipolar, diabetes, here presenting with shortness of breath and chest pain.  Patient has some shortness of breath with exertion for several days.  Patient also has some leg swelling.  Patient states that she is compliant with her 40 mg of Lasix daily.  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   The history is provided by the patient.       Home Medications Prior to Admission medications   Medication Sig Start Date End Date Taking? Authorizing Provider  ARIPiprazole (ABILIFY) 10 MG tablet Take 1 tablet (10 mg total) by mouth daily. 08/19/23 11/17/23  Elsie Lincoln, MD  atorvastatin (LIPITOR) 20 MG tablet Take 1 tablet (20 mg total) by mouth daily. 08/16/23   [name], MD  carvedilol (COREG) 12.5 MG tablet Take 1 tablet (12.5 mg total) by mouth 2 (two) times daily with a meal. 08/16/23   [name], MD  Continuous Glucose Sensor (DEXCOM G7 SENSOR) MISC 1 Device by Does not apply route as directed. 08/22/23   Shamleffer, Konrad Dolores, MD  dapagliflozin propanediol (FARXIGA) 10 MG TABS tablet Take 1 tablet (10 mg total) by mouth daily before breakfast. 08/16/23   [name], MD  furosemide (LASIX) 20 MG tablet Take 2 tablets (40 mg total) by mouth daily. 08/03/23   Laurey Morale, MD  GALAFOLD 123 MG CAPS Take by mouth. Patient not taking: Reported on 08/22/2023 08/03/23   [provider]  hydrALAZINE (APRESOLINE) 50 MG tablet Take one three times daily 08/16/23   [name], MD  Insulin Disposable Pump (OMNIPOD 5 G7 INTRO, GEN 5,) KIT 1 Device by Does not apply route every other day. 08/22/23   Shamleffer, Konrad Dolores, MD  insulin  glargine (LANTUS SOLOSTAR) 100 UNIT/ML Solostar Pen Inject 60 Units into the skin daily. 06/30/23   Shamleffer, Konrad Dolores, MD  insulin lispro (HUMALOG) 100 UNIT/ML KwikPen Inject 14 Units into the skin. Before meals daily    [provider]  Insulin Pen Needle 31G X 5 MM MISC Use as directed in the morning, at noon, in the evening, and at bedtime. 06/30/23   Shamleffer, Konrad Dolores, MD  isosorbide mononitrate (ISMO) 20 MG tablet Take 1 tablet (20 mg total) by mouth daily. 08/16/23   [name], MD  valsartan (DIOVAN) 320 MG tablet Take 1 tablet (320 mg total) by mouth daily. 08/16/23   [name], MD  varenicline (CHANTIX CONTINUING MONTH PAK) 1 MG tablet Take 1 tablet (1 mg total) by mouth 2 (two) times daily. 03/15/23   [name], MD  losartan (COZAAR) 100 MG tablet Take 1 tablet by mouth daily 04/27/23 04/27/23  [name], MD  spironolactone (ALDACTONE) 25 MG tablet HOLD 04/27/23 04/27/23  [name], MD      Allergies    Lamictal [lamotrigine], Strawberry extract, and Sulfa antibiotics    Review of Systems   Review of Systems  Respiratory:  Positive for shortness of breath.   Cardiovascular:  Positive for chest pain.  All other systems reviewed and are negative.   Physical Exam Updated Vital Signs BP (!) 154/60 (BP  Location: Right Arm)   Pulse 94   Temp 97.6 F (36.4 C) (Oral)   Resp 19   SpO2 97%  Physical Exam Vitals and nursing note reviewed.  Constitutional:      Comments: Chronically ill-appearing  HENT:     Head: Normocephalic.  Eyes:     Extraocular Movements: Extraocular movements intact.     Pupils: Pupils are equal, round, and reactive to light.  Cardiovascular:     Rate and Rhythm: Normal rate and regular rhythm.     Heart sounds: Normal heart sounds.  Pulmonary:     Comments: Tachypneic and diminished bilateral bases.  No wheezing. Abdominal:     Palpations: Abdomen is soft.  Musculoskeletal:     Cervical back:  Normal range of motion and neck supple.     Comments: Patient has a left BKA.  1 + R pedal edema   Skin:    General: Skin is warm.     Capillary Refill: Capillary refill takes less than 2 seconds.  Neurological:     General: No focal deficit present.     Mental Status: She is oriented to person, place, and time.  Psychiatric:        Mood and Affect: Mood normal.        Behavior: Behavior normal.     ED Results / Procedures / Treatments   Labs (all labs ordered are listed, but only abnormal results are displayed) Labs Reviewed  BASIC METABOLIC PANEL - Abnormal; Notable for the following components:      Result Value   Sodium 133 (*)    CO2 21 (*)    Glucose, Bld 330 (*)    BUN 25 (*)    Creatinine, Ser 1.52 (*)    Calcium 8.1 (*)    GFR, Estimated 42 (*)    All other components within normal limits  CBC - Abnormal; Notable for the following components:   RBC 2.78 (*)    Hemoglobin 8.0 (*)    HCT 25.5 (*)    All other components within normal limits  TROPONIN I (HIGH SENSITIVITY) - Abnormal; Notable for the following components:   Troponin I (High Sensitivity) 55 (*)    All other components within normal limits  TROPONIN I (HIGH SENSITIVITY) - Abnormal; Notable for the following components:   Troponin I (High Sensitivity) 51 (*)    All other components within normal limits  HCG, SERUM, QUALITATIVE  BRAIN NATRIURETIC PEPTIDE    EKG EKG Interpretation Date/Time:  Sunday October 02 2023 18:08:36 EDT Ventricular Rate:  89 PR Interval:  178 QRS Duration:  170 QT Interval:  442 QTC Calculation: 537 R Axis:   261  Text Interpretation: Normal sinus rhythm Non-specific intra-ventricular conduction block Minimal voltage criteria for LVH, may be normal variant ( Cornell product ) Lateral infarct , age undetermined Inferior infarct , age undetermined Abnormal ECG When compared with ECG of 22-Jul-2023 09:10, PREVIOUS ECG IS PRESENT Confirmed by Richardean Canal [phone]) on 10/02/2023  7:06:28 PM  Radiology DG Chest 2 View  Result Date: 10/02/2023 CLINICAL DATA:  chest pain EXAM: CHEST - 2 VIEW COMPARISON:  November 23, 2022 FINDINGS: The cardiomediastinal silhouette is unchanged and enlarged in contour. No pleural effusion. No pneumothorax. Mild peribronchial cuffing with faint interstitial markings. Linear opacity in the LEFT lateral mid lung, likely atelectasis. Visualized abdomen is unremarkable. LEFT chest cardiac pacing device. IMPRESSION: Constellation of findings are favored to reflect mild pulmonary edema with atelectasis. Electronically Signed  By: Meda Klinefelter M.D.   On: 10/02/2023 19:51    Procedures Procedures    Medications Ordered in ED Medications  furosemide (LASIX) injection 40 mg (40 mg Intravenous Given 10/02/23 2017)    ED Course/ Medical Decision Making/ A&P                                 Medical Decision Making Sarah Long is a 52 y.o. female here presenting with shortness of breath.  Patient has a history of heart failure.  Consider heart failure exacerbation versus ACS.  I do not think she has a PE.  Plan to get CBC and CMP and BNP and troponin.  Will give IV Lasix and reassess.  10:31 PM I reviewed patient's labs and trop is 55 then 51. BNP is 353. CXR showed pulmonary edema.  Patient was given Lasix and felt better.  Will increase Lasix to 40 mg in AM and 20 mg in PM.   Problems Addressed: Acute on chronic congestive heart failure, unspecified heart failure type [facility]): acute illness or injury  Amount and/or Complexity of Data Reviewed Labs: ordered. Decision-making details documented in ED Course. Radiology: ordered and independent interpretation performed. Decision-making details documented in ED Course. ECG/medicine tests: ordered and independent interpretation performed. Decision-making details documented in ED Course.  Risk Prescription drug management.    Final Clinical Impression(s) / ED Diagnoses Final diagnoses:   None    Rx / DC Orders ED Discharge Orders     None         Charlynne Pander, MD 10/02/23 2234
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
## 2023-10-02 NOTE — Discharge Instructions (Addendum)
Increase lasix to 40 mg in morning and 20 mg in the evening   See your cardiologist for follow up   Return to ER if you have worse shortness of breath, chest pain, leg swelling

## 2023-10-02 NOTE — ED Triage Notes (Signed)
Pt here via POV with reports of feeling like an elephant is sitting on her chest. Worse when lying down or exertion. Symptoms have been going on for thel last 5-6 days.

## 2023-10-04 ENCOUNTER — Ambulatory Visit (INDEPENDENT_AMBULATORY_CARE_PROVIDER_SITE_OTHER): Payer: Medicare Other | Admitting: Psychiatry

## 2023-10-04 ENCOUNTER — Telehealth: Payer: Self-pay

## 2023-10-04 ENCOUNTER — Other Ambulatory Visit: Payer: Self-pay | Admitting: *Deleted

## 2023-10-04 ENCOUNTER — Encounter (HOSPITAL_COMMUNITY): Payer: Self-pay | Admitting: Psychiatry

## 2023-10-04 DIAGNOSIS — F1090 Alcohol use, unspecified, uncomplicated: Secondary | ICD-10-CM

## 2023-10-04 DIAGNOSIS — F4310 Post-traumatic stress disorder, unspecified: Secondary | ICD-10-CM | POA: Diagnosis not present

## 2023-10-04 DIAGNOSIS — F319 Bipolar disorder, unspecified: Secondary | ICD-10-CM

## 2023-10-04 MED ORDER — HYDROXYZINE HCL 25 MG PO TABS: 12.5000 mg | ORAL_TABLET | Freq: Two times a day (BID) | ORAL | 1 refills | Status: DC | PRN

## 2023-10-04 MED ORDER — ARIPIPRAZOLE 10 MG PO TABS: ORAL_TABLET | ORAL | 2 refills | Status: DC

## 2023-10-04 NOTE — Patient Outreach (Signed)
Care Management/Care Coordination  RN Case Manager Case Closure Note  10/04/2023 Name: Sarah Long MRN: [id number] DOB: Apr 06, 1971  Sarah Long is a 52 y.o. year old female who is a primary care patient of [name], MD. The care management/care coordination team was consulted for assistance with chronic disease management and/or care coordination needs.   Care Plan : RN Care Manager Plan of Care  Updates made by Heidi Dach, RN since 10/04/2023 12:00 AM  Completed 10/04/2023   Problem: Health Management needs related to DMII Resolved 10/04/2023     Long-Range Goal: Development of Plan of Care to address Health Management needs related to DMII Completed 10/04/2023  Start Date: 08/10/2023  Expected End Date: 11/08/2023  Note:   Current Barriers:  Chronic Disease Management support and education needs related to DMII Unable to maintain contact with patient  RNCM Clinical Goal(s):  Patient will verbalize understanding of plan for management of DMII as evidenced by patient reports continue to work with RN Care Manager and/or Social Worker to address care management and care coordination needs related to DMII as evidenced by adherence to CM Team Scheduled appointments     Utilize Dexcom to monitor blood glucose  .   Interventions: Evaluation of current treatment plan related to  self management and patient's adherence to plan as established by provider   Diabetes:  (Status: Goal Not Met.) Long Term Goal   Lab Results  Component Value Date   HGBA1C 10.9 (A) 06/30/2023   @ Assessed patient's understanding of A1c goal: <7% Provided education to patient about basic DM disease process; Reviewed medications with patient and discussed importance of medication adherence;        Reviewed prescribed diet with patient MyPlate method, decrease carbs; Discussed plans with patient for ongoing care management follow up and provided patient with direct contact information for care  management team;      Reviewed scheduled/upcoming provider appointments including: 9/5 for labs, 9/10/;         Assessed social determinant of health barriers;        Advised patient to contact Dexcom Customer Service with questions and concerns regarding Dexcom G7 Advised patient to discuss podiatry referral with PCP on 08/16/23  Patient Goals/Self-Care Activities: Take medications as prescribed   Attend all scheduled provider appointments Call provider office for new concerns or questions  check blood sugar at prescribed times: Continuous  drink 6 to 8 glasses of water each day fill half of plate with vegetables limit fast food meals to no more than 1 per week manage portion size       Plan: We have been unable to make contact with the patient for follow up. The care management team is available to follow up with the patient should new care management/care coordination needs arise.   [name] RN, BSN [place]  [facility] RN Care Coordinator [phone]

## 2023-10-04 NOTE — Telephone Encounter (Signed)
..  Medicaid Managed Care   Unsuccessful Outreach Note  10/04/2023 Name: Sarah Long MRN: [id number] DOB: May 17, 1971  Referred by: [name], MD Reason for referral : Appointment   Third unsuccessful telephone outreach was attempted today. The patient was referred to the case management team for assistance with care management and care coordination. The patient's primary care provider has been notified of our unsuccessful attempts to make or maintain contact with the patient. The care management team is pleased to engage with this patient at any time in the future should he/she be interested in assistance from the care management team.   Follow Up Plan: We have been unable to make contact with the patient for follow up. The care management team is available to follow up with the patient after provider conversation with the patient regarding recommendation for care management engagement and subsequent re-referral to the care management team.   Weston Settle Care Guide  [facility] Managed  Care Guide [facility]  [phone]

## 2023-10-04 NOTE — Patient Instructions (Signed)
Thank you for attending your appointment today.  -- RESTART Abilify 5 mg daily for 1 week then INCREASE to 10 mg daily -- SWITCH to melatonin extended release 3-5 mg nightly on days off -- START Atarax 12.5-25 mg twice daily as needed for anxiety/sleep -- Continue other medications as prescribed.  Please do not make any changes to medications without first discussing with your provider. If you are experiencing a psychiatric emergency, please call 911 or present to your nearest emergency department. Additional crisis, medication management, and therapy resources are included below.  [facility]  [address] [place], [place] [address] [phone] WALK-IN URGENT CARE 24/7 FOR ANYONE [address], [place], [place]  [phone] Fax: [phone] [email or web address] *Interpreters available *Accepts all insurance and uninsured for Urgent Care needs *Accepts Medicaid and uninsured for outpatient treatment (below)      ONLY FOR [facility]  Below:    Outpatient New Patient Assessment/Therapy Walk-ins:        Monday -Thursday 8am until slots are full.        Every Friday 1pm-4pm  (first come, first served)                   New Patient Psychiatry/Medication Management        Monday-Friday 8am-11am (first come, first served)               For all walk-ins we ask that you arrive by 7:15am, because patients will be seen in the order of arrival.

## 2023-10-17 ENCOUNTER — Encounter (HOSPITAL_COMMUNITY): Payer: Self-pay

## 2023-10-17 ENCOUNTER — Ambulatory Visit (HOSPITAL_COMMUNITY)
Admission: RE | Admit: 2023-10-17 | Discharge: 2023-10-17 | Disposition: A | Discharge status: Home / Self Care | Payer: Medicare Other | Source: Ambulatory Visit | Attending: Family Medicine | Admitting: Family Medicine

## 2023-10-17 VITALS — BP 148/66 | HR 85 | Wt 225.2 lb

## 2023-10-17 DIAGNOSIS — E1169 Type 2 diabetes mellitus with other specified complication: Secondary | ICD-10-CM

## 2023-10-17 DIAGNOSIS — I10 Essential (primary) hypertension: Secondary | ICD-10-CM

## 2023-10-17 DIAGNOSIS — I130 Hypertensive heart and chronic kidney disease with heart failure and stage 1 through stage 4 chronic kidney disease, or unspecified chronic kidney disease: Secondary | ICD-10-CM | POA: Diagnosis not present

## 2023-10-17 DIAGNOSIS — E669 Obesity, unspecified: Secondary | ICD-10-CM | POA: Diagnosis not present

## 2023-10-17 DIAGNOSIS — R9431 Abnormal electrocardiogram [ECG] [EKG]: Secondary | ICD-10-CM | POA: Diagnosis not present

## 2023-10-17 DIAGNOSIS — Z950 Presence of cardiac pacemaker: Secondary | ICD-10-CM | POA: Diagnosis not present

## 2023-10-17 DIAGNOSIS — Z79899 Other long term (current) drug therapy: Secondary | ICD-10-CM | POA: Insufficient documentation

## 2023-10-17 DIAGNOSIS — E785 Hyperlipidemia, unspecified: Secondary | ICD-10-CM | POA: Insufficient documentation

## 2023-10-17 DIAGNOSIS — I5022 Chronic systolic (congestive) heart failure: Secondary | ICD-10-CM | POA: Insufficient documentation

## 2023-10-17 DIAGNOSIS — Z794 Long term (current) use of insulin: Secondary | ICD-10-CM | POA: Diagnosis not present

## 2023-10-17 DIAGNOSIS — N183 Chronic kidney disease, stage 3 unspecified: Secondary | ICD-10-CM | POA: Insufficient documentation

## 2023-10-17 DIAGNOSIS — Z87891 Personal history of nicotine dependence: Secondary | ICD-10-CM | POA: Insufficient documentation

## 2023-10-17 DIAGNOSIS — Z89512 Acquired absence of left leg below knee: Secondary | ICD-10-CM | POA: Diagnosis not present

## 2023-10-17 DIAGNOSIS — E1122 Type 2 diabetes mellitus with diabetic chronic kidney disease: Secondary | ICD-10-CM | POA: Insufficient documentation

## 2023-10-17 DIAGNOSIS — Z8719 Personal history of other diseases of the digestive system: Secondary | ICD-10-CM | POA: Insufficient documentation

## 2023-10-17 DIAGNOSIS — Z6838 Body mass index (BMI) 38.0-38.9, adult: Secondary | ICD-10-CM | POA: Diagnosis not present

## 2023-10-17 DIAGNOSIS — I442 Atrioventricular block, complete: Secondary | ICD-10-CM | POA: Insufficient documentation

## 2023-10-17 DIAGNOSIS — Z720 Tobacco use: Secondary | ICD-10-CM | POA: Diagnosis not present

## 2023-10-17 DIAGNOSIS — I428 Other cardiomyopathies: Secondary | ICD-10-CM | POA: Insufficient documentation

## 2023-10-17 DIAGNOSIS — Z7984 Long term (current) use of oral hypoglycemic drugs: Secondary | ICD-10-CM | POA: Diagnosis not present

## 2023-10-17 LAB — BASIC METABOLIC PANEL
Anion gap: 11 (ref 5–15)
BUN: 40 mg/dL — ABNORMAL HIGH (ref 6–20)
CO2: 17 mmol/L — ABNORMAL LOW (ref 22–32)
Calcium: 8.6 mg/dL — ABNORMAL LOW (ref 8.9–10.3)
Chloride: 106 mmol/L (ref 98–111)
Creatinine, Ser: 1.83 mg/dL — ABNORMAL HIGH (ref 0.44–1.00)
GFR, Estimated: 33 mL/min — ABNORMAL LOW (ref >60)
Glucose, Bld: 248 mg/dL — ABNORMAL HIGH (ref 70–99)
Potassium: 4.9 mmol/L (ref 3.5–5.1)
Sodium: 134 mmol/L — ABNORMAL LOW (ref 135–145)

## 2023-10-17 LAB — BRAIN NATRIURETIC PEPTIDE: B Natriuretic Peptide: 463.6 pg/mL — ABNORMAL HIGH (ref 0.0–100.0)

## 2023-10-17 MED ORDER — FUROSEMIDE 20 MG PO TABS: 60.0000 mg | ORAL_TABLET | Freq: Every day | ORAL | 11 refills | Status: DC

## 2023-10-17 NOTE — Patient Instructions (Signed)
Labs done today. We will contact you only if your labs are abnormal.  You may take 2 additional tablets as needed for swelling for a weight gain of 3 pounds or more in 24 hours or 5 pounds in 1 week.  No other medication changes were made. Please continue all current medications as prescribed.  Your physician recommends that you schedule a follow-up appointment in: 3 months with Dr. Shirlee Latch. Please contact our office in December to schedule a February 2025 appointment.   If you have any questions or concerns before your next appointment please send [place] a message through [place] or call our office at [phone].    TO LEAVE A MESSAGE FOR THE NURSE SELECT OPTION 2, PLEASE LEAVE A MESSAGE INCLUDING: YOUR NAME DATE OF BIRTH CALL BACK NUMBER REASON FOR CALL**this is important as we prioritize the call backs  YOU WILL RECEIVE A CALL BACK THE SAME DAY AS LONG AS YOU CALL BEFORE 4:00 PM   Do the following things EVERYDAY: Weigh yourself in the morning before breakfast. Write it down and keep it in a log. Take your medicines as prescribed Eat low salt foods--Limit salt (sodium) to 2000 mg per day.  Stay as active as you can everyday Limit all fluids for the day to less than 2 liters   At the [facility], you and your health needs are our priority. As part of our continuing mission to provide you with exceptional heart care, we have created designated Provider Care Teams. These Care Teams include your primary Cardiologist (physician) and Advanced Practice Providers (APPs- Physician Assistants and Nurse Practitioners) who all work together to provide you with the care you need, when you need it.   You may see any of the following providers on your designated Care Team at your next follow up: Dr Arvilla Meres Dr Marca Ancona Dr. Marcos Eke, NP Robbie Lis, [place] [facility] [place], [place] Brynda Peon, NP Karle Plumber, PharmD   Please be  sure to bring in all your medications bottles to every appointment.    Thank you for choosing [facility]

## 2023-10-17 NOTE — Progress Notes (Signed)
ReDS Vest / Clip - 10/17/23 1400       ReDS Vest / Clip   Station Marker B    Ruler Value 38    ReDS Value Range Low volume    ReDS Actual Value 34

## 2023-10-17 NOTE — Progress Notes (Signed)
PCP: [name], MD Endocrinology: Dr. Lonzo Cloud EP: Dr. Elberta Fortis Cardiology: Dr. Shirlee Latch  52 y.o. with history of type 2 diabetes, HTN, hyperlipidemia, complete heart block with PPM, and chronic diastolic CHF was referred by Dr. Delford Field for evaluation of CHF.  Patient has had diabetes since age 17 and has had HTN for at least 5 years.  She is a smoker and has a history of prior ETOH abuse with ETOH pancreatitis. In 3/21, she was admitted with DKA.  She was found to have associated complete heart block that did not resolve, she had St Jude PPM placed.  She had an echocardiogram showing EF 40-45% with severe LV hypertrophy.  She did not have a cardiac MRI prior to PPM placement.  Cath in 3/21 showed no significant CAD (nonischemic cardiomyopathy).   Last seen in 5/21, doing well but not taking any cardiac meds. Entresto started and started work up for cardiac sarcoidosis and cardiac amyloidosis.  Echo (12/22): EF 70-75%, severe LVH, normal RV, mild to moderate MR.  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 She has a functioning orthotic.   She had follow up with EP 1/23, PPM functioning appropriately. Daily lasix restarted and cMRI arranged.  cMRI (2/23) with LVEF 68%, severe LVH, ECV 54%, no LVOT gradient, poor nulling with uninterpretable delayed enhancement.   Follow up 5/23, NYHA II symptoms, volume overloaded. Lasix increase to 40 daily and genetic testing arranged to evaluate for TTR amyloidosis and hypertrophic cardiomyopathies. 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  Of note, "heart problems" run in family with mother and maternal grandparents having CHF.  Alpha galactosidase activity was low at 20.1.  She was seen for genetics counseling by Dr. 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   Echo 2/24 EF 65-70%, severe LVH, normal RV size and systolic function, GLS -6.4% (very abnormal), IVC normal.   Zio monitor in 3/24  showed 5 short SVT runs and rare PVCs.   She has been seen by Dr. Austin Miles in the Fabry's clinic at [facility].   Follow up 9/24, stable NYHA II-IIb symptoms and volume stable.     Seen in ED 10/02/23 with CP and SOB. CXR showed pulmonary edema, BNP 353. Hs Troponins 55>>51. Given 40 mg IV Lasix with improvement in symptoms. Home Lasix increased to 40/20 and he was discharged home.  Today she returns for post ED HF follow up. Overall feeling fine. She continues with R-sided chest heaviness, more so at night. She is SOB with any activity. She is occasionally light-headed. Denies palpitations, abnormal bleeding, edema, or PND/Orthopnea. Chronically sleeps on 4-5 pillows. Appetite ok. No fever or chills. Weight at home 210-215 pounds. Taking all medications. 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 Quit smoking x 2 months.  ReDs: 34%  ECG (personally reviewed): A sensed V paced 90 bpm  St Jude device interrogation (personally reviewed): >99% RV paced, < 1% AF.   Labs (4/21): K 4, creatinine 0.87 Labs (2/23): K 4.4, creatinine 1.12 Labs (5/23): K 5.2, creatinine 1.14, LDL 53, HDL 42 Labs (8/23): K 4.4, creatinine 1.48 Labs (10/23): K 5.1, creatinine 1.34 Labs (12/23): K 5.1, creatinine 1.06 Labs (2/24): LDL 88, K 4.2, creatinine 4.01 Labs (4/24): K 5.0, creatinine 1.30, LDL 73 Labs (7/24): K 4.9, creatinine 1.33 Labs (8/24): LDL 39 Labs (9/24): K 5.0, creatinine 1.74 Labs (10/24): K 4.0, creatinine 1.52  PMH: 1. Type 2 diabetes: Diagnosed at age 34.  2. HTN 3. Hyperlipidemia 4. Active smoker 5. H/o ETOH pancreatitis 6. Bipolar disorder 7. Complete heart block: 3/21, associate with DKA episode.  Has St Jude PPM.  8. Chronic systolic CHF: Nonischemic cardiomyopathy.  Echo (3/21) with EF 40-45%, severe LVH, mild MR, normal RV.  - LHC (3/21) normal coronaries.  - PYP scan (6/21): Negative for TTR cardiac amyloidosis.  - cMRI (2/23): LVEF 68%, severe LVH, ECV 54%, no LVOT gradient, poor nulling  with uninterpretable delayed enhancement.  - CTA chest (7/23): No evidence for pulmonary sarcoidosis, no ILD.  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  - Alpha-galactosidase activity testing low (20.1) - Echo (2/24): EF 65-70%, severe LVH, normal RV size and systolic function, GLS -6.4% (very abnormal), IVC normal.  9. S/p left BKA (7/22). Has Phantom Limb pain. 10. CKD stage 3: Diabetic nephropathy, ?related to Fabry disease.  11. Zio monitor (3/24): 5 short SVT runs and rare PVCs.  SH: Divorced, works as a Lawyer, prior heavy ETOH now rare, quite cigarettes 07/2022.  FH: Grandfather with CHF. No known sarcoidosis or amyloidosis.   ROS: All systems reviewed and negative except as per HPI.   Current Outpatient Medications  Medication Sig Dispense Refill   ARIPiprazole (ABILIFY) 10 MG tablet Take 1/2 tablet (5 mg total) daily for 1 week then increase to 1 tablet (10 mg total) daily 30 tablet 2   atorvastatin (LIPITOR) 20 MG tablet Take 1 tablet (20 mg total) by mouth daily. 90 tablet 3   carvedilol (COREG) 12.5 MG tablet Take 1 tablet (12.5 mg total) by mouth 2 (two) times daily with a meal. 120 tablet 1   Continuous Glucose Sensor (DEXCOM G7 SENSOR) MISC 1 Device by Does not apply route as directed. 9 each 3   dapagliflozin propanediol (FARXIGA) 10 MG TABS tablet Take 1 tablet (10 mg total) by mouth daily before breakfast. 30 tablet 11   furosemide (LASIX) 20 MG tablet Take 2 tablets (40 mg total) by mouth daily. (Patient taking differently: Patient takes 2 tablets in the morning and 1 tablet at night.)     hydrALAZINE (APRESOLINE) 50 MG tablet Take one three times daily 180 tablet 1   hydrOXYzine (ATARAX) 25 MG tablet Take 0.5-1 tablets (12.5-25 mg total) by mouth 2 (two) times daily as needed for anxiety (or sleep). 60 tablet 1   Insulin Disposable Pump (OMNIPOD 5 G7 INTRO, GEN 5,) KIT 1 Device by Does not apply route every other day. 1 kit 0    insulin glargine (LANTUS SOLOSTAR) 100 UNIT/ML Solostar Pen Inject 60 Units into the skin daily. 60 mL 3   insulin lispro (HUMALOG) 100 UNIT/ML KwikPen Inject 14 Units into the skin. Before meals daily     Insulin Pen Needle 31G X 5 MM MISC Use as directed in the morning, at noon, in the evening, and at bedtime. 400 each 3   isosorbide mononitrate (ISMO) 20 MG tablet Take 1 tablet (20 mg total) by mouth daily. 90 tablet 3   valsartan (DIOVAN) 320 MG tablet Take 1 tablet (320 mg total) by mouth daily. 90 tablet 3   varenicline (CHANTIX CONTINUING MONTH PAK) 1 MG tablet Take 1 tablet (1 mg total) by mouth 2 (two) times daily. 60 tablet 1   GALAFOLD 123 MG CAPS Take by mouth. (Patient not taking: Reported on 08/22/2023)     No current facility-administered medications for this encounter.   Wt Readings from Last 3 Encounters:  10/17/23 102.2  kg (225 lb 3.2 oz)  08/26/23 98.9 kg (218 lb)  08/22/23 97.5 kg (215 lb)   BP (!) 148/66   Pulse 85   Wt 102.2 kg (225 lb 3.2 oz)   SpO2 100%   BMI 38.66 kg/m  Physical Exam General:  NAD. No resp difficulty, walked into clinic HEENT: Normal Neck: Supple. No JVD. Carotids 2+ bilat; no bruits. No lymphadenopathy or thryomegaly appreciated. Cor: PMI nondisplaced. Regular rate & rhythm. No rubs, gallops or murmurs. Lungs: Clear Abdomen: Soft, obese, nontender, nondistended. No hepatosplenomegaly. No bruits or masses. Good bowel sounds. Extremities: No cyanosis, clubbing, rash, edema; s/p L BKA Neuro: Alert & oriented x 3, cranial nerves grossly intact. Moves all 4 extremities w/o difficulty. Affect pleasant.  Assessment/Plan: 1. Chronic systolic CHF: Echo in 3/21 with EF 40-45%, severe LV hypertrophy.  This was found in association with complete heart block.  Nonischemic cardiomyopathy, cath in 3/21 showed no significant CAD.  PYP scan (6/21) not suggestive of TTR amyloidosis. cMRI (2/23) showed LVEF 68%, severe LVH, ECV 54%, no LVOT gradient, poor  nulling with uninterpretable delayed enhancement. CTA chest did not show evidence for pulmonary sarcoidosis.  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  There is wide phenotypic variation in female Fabry's heterozygotes, but I suspect that the cardiomyopathy with LVH and h/o complete heart block in this situation is most likely due to Fabry's disease. Echo 2/24 showed EF 65-70%, severe LVH, normal RV size and systolic function, GLS -6.4% (very abnormal), IVC normal.  NYHA class II-IIb symptoms, she is not volume overloaded on exam, weight is down 5 lbs. - She has seen Dr. Austin Miles at [facility] for Fabry's evaluation. They are working on starting her on migalastat, an alpha-galactosidase A pharmacological chaperone.  Her alpha-gal variant leads to abnormal folding/less stable enzyme amenable to treatment with migalastat which should stabilize with enzyme and direct it to lysosome.   - Suspect recent CP episode related to hypervolemia. - Change Lasix to 60 mg daily (was taking 40/20). OK to take extra 40 mg PRN. BMET/BNP today. - Continue hydralazine 50 mg tid + isosorbide mononitrate 20 mg daily.  - Continue Farxiga 10 mg daily. No current GU symptoms. - Continue Coreg 12.5 mg bid. - Continue valsartan 320 mg daily.  2. HTN: BP elevated today but she has not had her medications yet today. - GDMT as above. 3. Hyperlipidemia: Continue atorvastatin. Good lipids 8/24 4. Smoking: Quit smoking x 2 months. Congratulated! - Continue Chantix. 5. Type 2 diabetes: Followed by Endocrinology. She is on insulin. - Blood sugars labile, most recent A1C 10.9. 6. Complete heart block: She has a St Jude PPM and is pacing her RV > 99% of the time.  This is likely related to Fabry's disease.  - She has lost her transmitter, I asked her to call EP. 7. Obesity: Body mass index is 38.66 kg/m. - Would avoid GLP-1 agonist with  history of pancreatitis.   Follow up in 3 months with Dr Shirlee Latch, as scheduled.  Anderson Malta [facility] FNP-BC 10/17/2023

## 2023-10-18 ENCOUNTER — Telehealth (HOSPITAL_COMMUNITY): Payer: Self-pay | Admitting: Cardiology

## 2023-10-18 DIAGNOSIS — I5022 Chronic systolic (congestive) heart failure: Secondary | ICD-10-CM

## 2023-10-18 NOTE — Telephone Encounter (Signed)
-----   Message from Jacklynn Ganong sent at 10/17/2023  4:47 PM EST ----- Labs stable.  Repeat BMET in 2 weeks to follow renal function

## 2023-10-18 NOTE — Telephone Encounter (Signed)
Patient called.  Patient aware.  

## 2023-10-19 ENCOUNTER — Telehealth (HOSPITAL_COMMUNITY): Payer: Self-pay | Admitting: Licensed Clinical Social Worker

## 2023-10-19 NOTE — Telephone Encounter (Signed)
H&V Care Navigation CSW Progress Note  Clinical Social Worker received call from pt regarding current financial concerns.  States she had to quit work due to her health concerns and new health concerns of her children preventing her from keeping regular hours.  Now only source of household income is around $700/month in survivor benefits that one of her children has.  States they are managing despite lowered income as her food stamps has been raised to over $900/month and she is working on getting her section 8 voucher increased to help cover rent.  Also concerned about outstanding electric bill- CSW informed about upcoming [employer] program opening for some of the population on 12/1 and about possible utility assistance from [employer] if she brings proof of losing her job.  Patient is participating in a Managed Medicaid Plan:  Yes  SDOH Screenings   Food Insecurity: Food Insecurity Present (08/10/2023)  Housing: Low Risk  (08/10/2023)  Transportation Needs: Unmet Transportation Needs (08/10/2023)  Utilities: At Risk (08/10/2023)  Alcohol Screen: Low Risk  (04/01/2023)  Depression (PHQ2-9): Low Risk  (08/16/2023)  Recent Concern: Depression (PHQ2-9) - Medium Risk (06/28/2023)  Financial Resource Strain: High Risk (07/12/2023)  Physical Activity: Insufficiently Active (04/01/2023)  Social Connections: Moderately Isolated (04/01/2023)  Stress: Stress Concern Present (04/01/2023)  Tobacco Use: Medium Risk (10/17/2023)   Will continue to follow and assist as needed  Burna Sis, LCSW Clinical Social Worker [facility] Desk#: [phone] Cell#: [phone]

## 2023-11-01 ENCOUNTER — Ambulatory Visit (HOSPITAL_COMMUNITY)
Admission: RE | Admit: 2023-11-01 | Discharge: 2023-11-01 | Disposition: A | Discharge status: Home / Self Care | Payer: Medicare Other | Source: Ambulatory Visit | Attending: Internal Medicine | Admitting: Internal Medicine

## 2023-11-01 DIAGNOSIS — I5022 Chronic systolic (congestive) heart failure: Secondary | ICD-10-CM | POA: Insufficient documentation

## 2023-11-01 LAB — BASIC METABOLIC PANEL
Anion gap: 9 (ref 5–15)
BUN: 38 mg/dL — ABNORMAL HIGH (ref 6–20)
CO2: 19 mmol/L — ABNORMAL LOW (ref 22–32)
Calcium: 8.7 mg/dL — ABNORMAL LOW (ref 8.9–10.3)
Chloride: 109 mmol/L (ref 98–111)
Creatinine, Ser: 2.1 mg/dL — ABNORMAL HIGH (ref 0.44–1.00)
GFR, Estimated: 28 mL/min — ABNORMAL LOW (ref >60)
Glucose, Bld: 148 mg/dL — ABNORMAL HIGH (ref 70–99)
Potassium: 4.9 mmol/L (ref 3.5–5.1)
Sodium: 137 mmol/L (ref 135–145)

## 2023-11-01 NOTE — Telephone Encounter (Signed)
Care team updated and letter sent for eye exam notes.

## 2023-11-02 ENCOUNTER — Telehealth (HOSPITAL_COMMUNITY): Payer: Self-pay

## 2023-11-02 NOTE — Telephone Encounter (Signed)
-----   Message from Jacklynn Ganong sent at 11/01/2023 11:55 AM EST ----- Labs ok but blood glucose elevated. Needs PCP follow up to address.

## 2023-11-02 NOTE — Telephone Encounter (Signed)
Spoke with patient regarding the following results. Patient made aware and patient verbalized understanding.

## 2023-11-05 ENCOUNTER — Other Ambulatory Visit (HOSPITAL_COMMUNITY): Payer: Self-pay | Admitting: Psychiatry

## 2023-11-06 DIAGNOSIS — G546 Phantom limb syndrome with pain: Secondary | ICD-10-CM | POA: Diagnosis not present
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
## 2023-11-08 ENCOUNTER — Ambulatory Visit (INDEPENDENT_AMBULATORY_CARE_PROVIDER_SITE_OTHER): Payer: Medicaid Other | Admitting: Psychiatry

## 2023-11-08 ENCOUNTER — Encounter (HOSPITAL_COMMUNITY): Payer: Self-pay | Admitting: Psychiatry

## 2023-11-08 DIAGNOSIS — F319 Bipolar disorder, unspecified: Secondary | ICD-10-CM

## 2023-11-08 DIAGNOSIS — F4310 Post-traumatic stress disorder, unspecified: Secondary | ICD-10-CM

## 2023-11-08 DIAGNOSIS — F1090 Alcohol use, unspecified, uncomplicated: Secondary | ICD-10-CM

## 2023-11-08 MED ORDER — HYDROXYZINE HCL 25 MG PO TABS: 25.0000 mg | ORAL_TABLET | Freq: Two times a day (BID) | ORAL | 2 refills | Status: DC | PRN

## 2023-11-08 MED ORDER — ARIPIPRAZOLE 15 MG PO TABS: 15.0000 mg | ORAL_TABLET | Freq: Every day | ORAL | 2 refills | Status: DC

## 2023-11-08 NOTE — Patient Instructions (Signed)
Thank you for attending your appointment today.  -- INCREASE Abilify to 15 mg daily -- Continue other medications as prescribed.  Please do not make any changes to medications without first discussing with your provider. If you are experiencing a psychiatric emergency, please call 911 or present to your nearest emergency department. Additional crisis, medication management, and therapy resources are included below.  [facility]  [address] [place], [place] [address] [phone] WALK-IN URGENT CARE 24/7 FOR ANYONE [address] [place], [place]  [phone] Fax: [phone] [email or web address] *Interpreters available *Accepts all insurance and uninsured for Urgent Care needs *Accepts Medicaid and uninsured for outpatient treatment (below)      ONLY FOR [facility]  Below:    Outpatient New Patient Assessment/Therapy Walk-ins:        Monday, Wednesday, and Thursday 8am until slots are full (first come, first served)                   New Patient Psychiatry/Medication Management        Monday-Friday 8am-11am (first come, first served)               For all walk-ins we ask that you arrive by 7:15am, because patients will be seen in the order of arrival.

## 2023-11-10 ENCOUNTER — Encounter: Payer: Self-pay | Admitting: Cardiology

## 2023-11-11 ENCOUNTER — Ambulatory Visit
Admission: EM | Admit: 2023-11-11 | Discharge: 2023-11-11 | Disposition: A | Discharge status: Home / Self Care | Payer: Medicare Other

## 2023-11-11 DIAGNOSIS — J0100 Acute maxillary sinusitis, unspecified: Secondary | ICD-10-CM | POA: Diagnosis not present

## 2023-11-11 DIAGNOSIS — H6993 Unspecified Eustachian tube disorder, bilateral: Secondary | ICD-10-CM

## 2023-11-11 MED ORDER — FLUTICASONE PROPIONATE 50 MCG/ACT NA SUSP: 1.0000 | Freq: Every day | NASAL | 0 refills | Status: DC

## 2023-11-11 MED ORDER — AMOXICILLIN-POT CLAVULANATE 875-125 MG PO TABS: 1.0000 | ORAL_TABLET | Freq: Two times a day (BID) | ORAL | 0 refills | Status: DC

## 2023-11-11 NOTE — ED Triage Notes (Signed)
"  My ears and my throat are hurting with some cough". Symptoms began about "5 days ago". No fever.

## 2023-11-11 NOTE — ED Provider Notes (Signed)
[facility]    CSN: [id number] Arrival date & time: 11/11/23  4098      History   Chief Complaint No chief complaint on file.   HPI Sarah Long is a 52 y.o. female.   HPI  Past Medical History:  Diagnosis Date  . Abnormal Pap smear 1998  . Abnormal vaginal bleeding 12/19/2011  . Acute urinary retention 06/26/2021  . Arthritis   . Bacterial infection   . Bipolar 1 disorder (HCC)   . Blister of second toe of left foot 11/21/2016  . Candida vaginitis 07/2007  . Depression    recently added wellbutrin-has not taken yet for bipolar  . Diabetes in pregnancy   . Diabetes mellitus    nph 20U qam and qpm, regular with meals  . Diabetic ketoacidosis without coma associated with type 2 diabetes mellitus (HCC)   . Fibroid   . Galactorrhea of right breast 2008  . GERD (gastroesophageal reflux disease)   . H/O amenorrhea 07/2007  . H/O dizziness 10/14/2011  . H/O dysmenorrhea 2010  . H/O menorrhagia 10/14/2011  . H/O varicella   . Headache(784.0)   . Heavy vaginal bleeding due to contraceptive injection use 10/12/2011   Depo Provera  . Herpes   . Homelessness 11/19/2021  . HSV-2 infection 01/03/2009  . Hx: UTI (urinary tract infection) 2009  . Hypertension    on aldomet  . Hypoalbuminemia due to protein-calorie malnutrition (HCC) 06/26/2021  . Increased BMI 2010  . Irregular uterine bleeding 04/04/2012   Pt has mirena   . Obesity 10/14/2011  . Oligomenorrhea 07/2007  . Osteomyelitis of great toe of right foot (HCC) 03/22/2022  . Pelvic pain in female   . Presence of permanent cardiac pacemaker   . Preterm labor   . PTSD (post-traumatic stress disorder)   . Trichomonas   . Yeast infection     Patient Active Problem List   Diagnosis Date Noted  . Skin rash 06/28/2023  . PTSD (post-traumatic stress disorder) 05/13/2023  . Cardiac amyloidosis (HCC) 04/27/2023  . Type 2 diabetes mellitus with microalbuminuria, with long-term current use of insulin  (HCC) 12/15/2022  . Peripheral vascular disease, unspecified (HCC) 03/22/2022  . Type 2 diabetes mellitus with diabetic polyneuropathy, with long-term current use of insulin (HCC) 02/03/2022  . Type 2 diabetes mellitus with hyperglycemia, with long-term current use of insulin (HCC) 02/03/2022  . Microalbuminuria 02/03/2022  . Right great toe amputee (HCC) 11/03/2021  . Class 2 severe obesity due to excess calories with serious comorbidity and body mass index (BMI) of 36.0 to 36.9 in adult [facility]) 10/15/2021  . Insomnia 10/07/2021  . Left below-knee amputee (HCC) 07/01/2021  . Normocytic anemia 06/26/2021  . Heart failure with mid-range ejection fraction (HCC) 06/26/2021  . Diabetic nephropathy with proteinuria (HCC) 06/26/2021  . Vaginal discharge 07/16/2020  . Dental caries 04/01/2020  . Cracked tooth 04/01/2020  . Vitamin D deficiency 03/11/2020  . Prolonged QT interval 02/26/2020  . Chronic combined systolic and diastolic CHF (congestive heart failure) (HCC) 02/26/2020  . Heart block AV complete (HCC) 02/11/2020  . Exocrine pancreatic insufficiency   . Dysmenorrhea 10/02/2019  . DM type 2 with diabetic peripheral neuropathy (HCC) 05/30/2019  . 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 block) 05/29/2019  . Former smoker 11/26/2013  . Hypercholesteremia 02/02/2007  . Bipolar 1 disorder, depressed (HCC) 02/02/2007  . HYPERTENSION, BENIGN SYSTEMIC 02/02/2007    Past Surgical History:  Procedure Laterality Date  . ABDOMINAL AORTOGRAM  W/LOWER EXTREMITY N/A 06/17/2021   Procedure: ABDOMINAL AORTOGRAM W/LOWER EXTREMITY;  Surgeon: Iran Ouch, MD;  Location: [facility];  Service: Cardiovascular;  Laterality: N/A;  . AMPUTATION Left 06/27/2021   Procedure: AMPUTATION BELOW KNEE;  Surgeon: Nadara Mustard, MD;  Location: [facility] OR;  Service: Orthopedics;  Laterality: Left;  . AMPUTATION Right 09/04/2021   Procedure: RIGHT GREAT TOE AMPUTATION;  Surgeon: Nadara Mustard, MD;  Location: [facility] OR;  Service: Orthopedics;  Laterality: Right;  . CESAREAN SECTION  1991  . LEFT HEART CATH AND CORONARY ANGIOGRAPHY N/A 02/10/2020   Procedure: LEFT HEART CATH AND CORONARY ANGIOGRAPHY;  Surgeon: Lennette Bihari, MD;  Location: [facility];  Service: Cardiovascular;  Laterality: N/A;  . PACEMAKER IMPLANT N/A 02/13/2020   Procedure: PACEMAKER IMPLANT;  Surgeon: Regan Lemming, MD;  Location: [facility];  Service: Cardiovascular;  Laterality: N/A;  . TEMPORARY PACEMAKER N/A 02/10/2020   Procedure: TEMPORARY PACEMAKER;  Surgeon: Lennette Bihari, MD;  Location: [facility] CV LAB;  Service: Cardiovascular;  Laterality: N/A;    OB History     Gravida  6   Para  4   Term  1   Preterm  3   AB  2   Living  4      SAB  1   IAB  1   Ectopic  0   Multiple  0   Live Births  3            Home Medications    Prior to Admission medications   Medication Sig Start Date End Date Taking? Authorizing Provider  ARIPiprazole (ABILIFY) 15 MG tablet Take 1 tablet (15 mg total) by mouth daily. 11/08/23   Bahraini, Sarah A  atorvastatin (LIPITOR) 20 MG tablet Take 1 tablet (20 mg total) by mouth daily. 08/16/23   [name], MD  carvedilol (COREG) 12.5 MG tablet Take 1 tablet (12.5 mg total) by mouth 2 (two) times daily with a meal. 08/16/23   [name], MD  Continuous Glucose Sensor (DEXCOM G7 SENSOR) MISC 1 Device by Does not apply route as directed. 08/22/23   Shamleffer, Konrad Dolores, MD  dapagliflozin propanediol (FARXIGA) 10 MG TABS tablet Take 1 tablet (10 mg total) by mouth daily before breakfast. 08/16/23   [name], MD  furosemide (LASIX) 20 MG tablet Take 3 tablets (60 mg total) by mouth daily. You may take 2 additional tablets as needed for swelling for a weight gain of 3 pounds or more in 24 hours or 5 pounds in 1 week. 10/17/23   [place], Anderson Malta, FNP  GALAFOLD 123 MG CAPS Take by mouth. Patient not taking:  Reported on 08/22/2023 08/03/23   [provider]  hydrALAZINE (APRESOLINE) 50 MG tablet Take one three times daily 08/16/23   [name], MD  hydrOXYzine (ATARAX) 25 MG tablet Take 1 tablet (25 mg total) by mouth 2 (two) times daily as needed for anxiety (or sleep). 11/08/23 02/06/24  Bahraini, Sarah A  Insulin Disposable Pump (OMNIPOD 5 G7 INTRO, GEN 5,) KIT 1 Device by Does not apply route every other day. 08/22/23   Shamleffer, Konrad Dolores, MD  insulin glargine (LANTUS SOLOSTAR) 100 UNIT/ML Solostar Pen Inject 60 Units into the skin daily. 06/30/23   Shamleffer, Konrad Dolores, MD  insulin lispro (HUMALOG) 100 UNIT/ML KwikPen Inject 14 Units into the skin. Before meals daily    [provider]  Insulin Pen Needle  31G X 5 MM MISC Use as directed in the morning, at noon, in the evening, and at bedtime. 06/30/23   Shamleffer, Konrad Dolores, MD  isosorbide mononitrate (ISMO) 20 MG tablet Take 1 tablet (20 mg total) by mouth daily. 08/16/23   [name], MD  valsartan (DIOVAN) 320 MG tablet Take 1 tablet (320 mg total) by mouth daily. 08/16/23   [name], MD  varenicline (CHANTIX CONTINUING MONTH PAK) 1 MG tablet Take 1 tablet (1 mg total) by mouth 2 (two) times daily. 03/15/23   [name], MD  losartan (COZAAR) 100 MG tablet Take 1 tablet by mouth daily 04/27/23 04/27/23  [name], MD  spironolactone (ALDACTONE) 25 MG tablet HOLD 04/27/23 04/27/23  [name], MD    Family History Family History  Problem Relation Age of Onset  . Hypertension Mother   . Diabetes Mother   . Heart disease Mother   . Hypertension Father   . Diabetes Father   . Heart disease Father   . Stroke Maternal Grandfather   . Depression Maternal Grandmother   . Suicidality Maternal Grandmother   . Schizophrenia Paternal Grandmother   . Other Neg Hx   . Breast cancer Neg Hx     Social History Social History   Tobacco Use  . Smoking status: Former    Current  packs/day: 0.00    Average packs/day: 0.5 packs/day for 20.0 years (10.0 ttl pk-yrs)    Types: Cigarettes    Start date: 07/01/2001    Quit date: 07/01/2021    Years since quitting: 2.3  . Smokeless tobacco: Never  . Tobacco comments:    1. Centrix to stop smoking   Vaping Use  . Vaping status: Never Used  Substance Use Topics  . Alcohol use: Not Currently    Comment: denies recently; history of excessive use  . Drug use: No     Allergies   Lamictal [lamotrigine], Strawberry extract, and Sulfa antibiotics   Review of Systems Review of Systems  Constitutional:  Negative for chills and fever.  HENT:  Positive for congestion, sinus pressure and sore throat. Negative for ear pain.   Eyes:  Negative for discharge and redness.  Respiratory:  Positive for cough. Negative for shortness of breath and wheezing.   Gastrointestinal:  Negative for abdominal pain, diarrhea, nausea and vomiting.     Physical Exam Triage Vital Signs ED Triage Vitals  Encounter Vitals Group     BP      Systolic BP Percentile      Diastolic BP Percentile      Pulse      Resp      Temp      Temp src      SpO2      Weight      Height      Head Circumference      Peak Flow      Pain Score      Pain Loc      Pain Education      Exclude from Growth Chart    No data found.  Updated Vital Signs There were no vitals taken for this visit.  Visual Acuity Right Eye Distance:   Left Eye Distance:   Bilateral Distance:    Right Eye Near:   Left Eye Near:    Bilateral Near:     Physical Exam Vitals and nursing note reviewed.  Constitutional:      General: She is not  in acute distress.    Appearance: Normal appearance. She is not ill-appearing.  HENT:     Head: Normocephalic and atraumatic.     Right Ear: Tympanic membrane normal.     Left Ear: Tympanic membrane normal.     Nose: Congestion present.     Mouth/Throat:     Mouth: Mucous membranes are moist.     Pharynx: No oropharyngeal  exudate or posterior oropharyngeal erythema.  Eyes:     Conjunctiva/sclera: Conjunctivae normal.  Cardiovascular:     Rate and Rhythm: Normal rate and regular rhythm.     Heart sounds: Normal heart sounds. No murmur heard. Pulmonary:     Effort: Pulmonary effort is normal. No respiratory distress.     Breath sounds: Normal breath sounds. No wheezing, rhonchi or rales.  Skin:    General: Skin is warm and dry.  Neurological:     Mental Status: She is alert.  Psychiatric:        Mood and Affect: Mood normal.        Thought Content: Thought content normal.     UC Treatments / Results  Labs (all labs ordered are listed, but only abnormal results are displayed) Labs Reviewed - No data to display  EKG   Radiology No results found.  Procedures Procedures (including critical care time)  Medications Ordered in UC Medications - No data to display  Initial Impression / Assessment and Plan / UC Course  I have reviewed the triage vital signs and the nursing notes.  Pertinent labs & imaging results that were available during my care of the patient were reviewed by me and considered in my medical decision making (see chart for details).     *** Final Clinical Impressions(s) / UC Diagnoses   Final diagnoses:  None   Discharge Instructions   None    ED Prescriptions   None    PDMP not reviewed this encounter.

## 2023-11-25 ENCOUNTER — Telehealth (HOSPITAL_COMMUNITY): Payer: Self-pay | Admitting: Licensed Clinical Social Worker

## 2023-11-25 ENCOUNTER — Encounter: Payer: Self-pay | Admitting: Internal Medicine

## 2023-11-25 ENCOUNTER — Ambulatory Visit: Payer: Medicaid Other | Admitting: Internal Medicine

## 2023-11-25 VITALS — BP 114/76 | HR 72 | Ht 64.0 in | Wt 226.0 lb

## 2023-11-25 DIAGNOSIS — E1142 Type 2 diabetes mellitus with diabetic polyneuropathy: Secondary | ICD-10-CM | POA: Diagnosis not present

## 2023-11-25 DIAGNOSIS — Z794 Long term (current) use of insulin: Secondary | ICD-10-CM

## 2023-11-25 DIAGNOSIS — R809 Proteinuria, unspecified: Secondary | ICD-10-CM | POA: Diagnosis not present

## 2023-11-25 DIAGNOSIS — E1129 Type 2 diabetes mellitus with other diabetic kidney complication: Secondary | ICD-10-CM

## 2023-11-25 DIAGNOSIS — Z89411 Acquired absence of right great toe: Secondary | ICD-10-CM

## 2023-11-25 DIAGNOSIS — Z89512 Acquired absence of left leg below knee: Secondary | ICD-10-CM

## 2023-11-25 DIAGNOSIS — E1165 Type 2 diabetes mellitus with hyperglycemia: Secondary | ICD-10-CM

## 2023-11-25 LAB — POCT GLYCOSYLATED HEMOGLOBIN (HGB A1C): Hemoglobin A1C: 10.2 % — AB (ref 4.0–5.6)

## 2023-11-25 NOTE — Progress Notes (Signed)
Name: Sarah Long  MRN/ DOB: [id number], Oct 31, 1971   Age/ Sex: 52 y.o., female    PCP: [name], MD   Reason for Endocrinology Evaluation: Type 2 Diabetes Mellitus     Date of Initial Endocrinology Visit: 11/02/2021    PATIENT IDENTIFIER: Sarah Long is a 52 y.o. female with a past medical history of HTN , Bipolar d/o,and T2DM, has Hx of Pancreatitis . The patient presented for initial endocrinology clinic visit on 11/02/2021 for consultative assistance with her diabetes management.    HPI: Sarah Long was    Diagnosed with DM at age 23 stated as gestational diabetes by age 65 she became diagnosed with T2 DM  Prior Medications tried/Intolerance: Intolerant to Metformin.  Hemoglobin A1c has ranged from 7.4% in 2022, peaking at 13.8% in 2014. Patient has required hospitalization within the last 1 year from hyper or hypoglycemia: DKA 06/2021   On her initial visit to our clinic she had an A1c 7.4%, we continued MDI regimen and provided her with correction scale    She was trained on OmniPod 07/2023  SUBJECTIVE:   During the last visit (06/30/2023): A1c 10.6%     Today (11/25/23): Sarah Long is here for a follow up on diabetes management. She checks her  blood sugars multiple times daily through CGM.  The patient has  not had hypoglycemic episodes since the last clinic visit.  Patient continues to follow-up with behavioral health for bipolar disorder and depression She presented to the ED 09/2023 with chest pain shortness of breath, was treated for CHF, follows with cardiology  Patient has been noted with vomiting that she attributes to one of her cardiac medications No changes in bowel movements   This patient with type 2 diabetes is treated with Omnipod (insulin pump). During the visit the pump basal and bolus doses were reviewed including carb/insulin rations and supplemental doses. The clinical list was updated. The glucose meter download was reviewed in detail  to determine if the current pump settings are providing the best glycemic control without excessive hypoglycemia.  Pump and meter download:    Pump   OmniPod Settings   Insulin type   Humalog   Basal rate       0000 2.0u/h    0800 2.0   2200 2.0       I:C ratio       0000  1:1    Enter 14               Sensitivity       0000  25      Goal       0000  110            Type & Model of Pump: OmniPod Insulin Type: Currently using Humalog.  Body mass index is 38.79 kg/m.  PUMP STATISTICS: Average BG: 263  Average Daily Carbs (g): 21.3 Average Total Daily Insulin: 65.2 Average Daily Basal: 41.1 (63 %) Average Daily Bolus: 24 (37%)   HOME DIABETES REGIMEN:  Humalog      Statin: no ACE-I/ARB: no Prior Diabetic Education: yes  CONTINUOUS GLUCOSE MONITORING RECORD INTERPRETATION    Dates of Recording: 12/7 - 11/25/2023  Sensor description: Dexcom G7  Results statistics:   CGM use % of time 35.1  Average and SD 263/70  Time in range     11   %  % Time Above 180 39  % Time above 250 50  % Time Below target  0   Glycemic patterns summary: Patient has been noted with hypoglycemia throughout the day and night  Hyperglycemic episodes all day and night, worse postprandial  Hypoglycemic episodes occurred N/A  Overnight periods: High     DIABETIC COMPLICATIONS: Microvascular complications:  Left BKA, Right  great toe amputation , neuropathy  Denies: CKD, retinopathy  Last eye exam: Completed 01/2021  Macrovascular complications:  PAD Denies: CAD, CVA   PAST HISTORY: Past Medical History:  Past Medical History:  Diagnosis Date   Abnormal Pap smear 1998   Abnormal vaginal bleeding 12/19/2011   Acute urinary retention 06/26/2021   Arthritis    Bacterial infection    Bipolar 1 disorder (HCC)    Blister of second toe of left foot 11/21/2016   Candida vaginitis 07/2007   Depression    recently added wellbutrin-has not taken yet for bipolar    Diabetes in pregnancy    Diabetes mellitus    nph 20U qam and qpm, regular with meals   Diabetic ketoacidosis without coma associated with type 2 diabetes mellitus (HCC)    Fibroid    Galactorrhea of right breast 2008   GERD (gastroesophageal reflux disease)    H/O amenorrhea 07/2007   H/O dizziness 10/14/2011   H/O dysmenorrhea 2010   H/O menorrhagia 10/14/2011   H/O varicella    Headache(784.0)    Heavy vaginal bleeding due to contraceptive injection use 10/12/2011   Depo Provera   Herpes    Homelessness 11/19/2021   HSV-2 infection 01/03/2009   Hx: UTI (urinary tract infection) 2009   Hypertension    on aldomet   Hypoalbuminemia due to protein-calorie malnutrition (HCC) 06/26/2021   Increased BMI 2010   Irregular uterine bleeding 04/04/2012   Pt has mirena    Obesity 10/14/2011   Oligomenorrhea 07/2007   Osteomyelitis of great toe of right foot (HCC) 03/22/2022   Pelvic pain in female    Presence of permanent cardiac pacemaker    Preterm labor    PTSD (post-traumatic stress disorder)    Trichomonas    Yeast infection    Past Surgical History:  Past Surgical History:  Procedure Laterality Date   ABDOMINAL AORTOGRAM W/LOWER EXTREMITY N/A 06/17/2021   Procedure: ABDOMINAL AORTOGRAM W/LOWER EXTREMITY;  Surgeon: Iran Ouch, MD;  Location: [facility];  Service: Cardiovascular;  Laterality: N/A;   AMPUTATION Left 06/27/2021   Procedure: AMPUTATION BELOW KNEE;  Surgeon: Nadara Mustard, MD;  Location: [facility] OR;  Service: Orthopedics;  Laterality: Left;   AMPUTATION Right 09/04/2021   Procedure: RIGHT GREAT TOE AMPUTATION;  Surgeon: Nadara Mustard, MD;  Location: [facility] OR;  Service: Orthopedics;  Laterality: Right;   CESAREAN SECTION  1991   LEFT HEART CATH AND CORONARY ANGIOGRAPHY N/A 02/10/2020   Procedure: LEFT HEART CATH AND CORONARY ANGIOGRAPHY;  Surgeon: Lennette Bihari, MD;  Location: [facility];  Service: Cardiovascular;  Laterality: N/A;   PACEMAKER  IMPLANT N/A 02/13/2020   Procedure: PACEMAKER IMPLANT;  Surgeon: Regan Lemming, MD;  Location: [facility];  Service: Cardiovascular;  Laterality: N/A;   TEMPORARY PACEMAKER N/A 02/10/2020   Procedure: TEMPORARY PACEMAKER;  Surgeon: Lennette Bihari, MD;  Location: [facility] INVASIVE CV LAB;  Service: Cardiovascular;  Laterality: N/A;    Social History:  reports that she quit smoking about 2 years ago. Her smoking use included cigarettes. She started smoking about 22 years ago. She has a 10 pack-year smoking history. She has never used smokeless tobacco. She  reports that she does not currently use alcohol. She reports that she does not use drugs. Family History:  Family History  Problem Relation Age of Onset   Hypertension Mother    Diabetes Mother    Heart disease Mother    Hypertension Father    Diabetes Father    Heart disease Father    Depression Maternal Grandmother    Suicidality Maternal Grandmother    Stroke Maternal Grandfather    Schizophrenia Paternal Grandmother    Other Neg Hx    Breast cancer Neg Hx      HOME MEDICATIONS: Allergies as of 11/25/2023       Reactions   Lamictal [lamotrigine] Itching   Strawberry Extract Hives   Sulfa Antibiotics Hives, Itching        Medication List        Accurate as of November 25, 2023  9:18 AM. If you have any questions, ask your nurse or doctor.          amoxicillin-clavulanate 875-125 MG tablet Commonly known as: AUGMENTIN Take 1 tablet by mouth every 12 (twelve) hours.   ARIPiprazole 15 MG tablet Commonly known as: ABILIFY Take 1 tablet (15 mg total) by mouth daily.   atorvastatin 20 MG tablet Commonly known as: LIPITOR Take 1 tablet (20 mg total) by mouth daily.   Carestart COVID-19 Home Test Kit Generic drug: COVID-19 At Home Antigen Test See admin instructions.   carvedilol 12.5 MG tablet Commonly known as: COREG Take 1 tablet (12.5 mg total) by mouth 2 (two) times daily with a meal.    dapagliflozin propanediol 10 MG Tabs tablet Commonly known as: Farxiga Take 1 tablet (10 mg total) by mouth daily before breakfast.   Dexcom G7 Sensor Misc 1 Device by Does not apply route as directed.   fluticasone 50 MCG/ACT nasal spray Commonly known as: FLONASE Place 1 spray into both nostrils daily.   furosemide 20 MG tablet Commonly known as: LASIX Take 3 tablets (60 mg total) by mouth daily. You may take 2 additional tablets as needed for swelling for a weight gain of 3 pounds or more in 24 hours or 5 pounds in 1 week.   Galafold 123 MG Caps Generic drug: migALAstat HCl Take by mouth.   hydrALAZINE 50 MG tablet Commonly known as: APRESOLINE Take one three times daily   hydrOXYzine 25 MG tablet Commonly known as: ATARAX Take 1 tablet (25 mg total) by mouth 2 (two) times daily as needed for anxiety (or sleep).   insulin lispro 100 UNIT/ML KwikPen Commonly known as: HUMALOG Inject 14 Units into the skin. Before meals daily   Insulin Pen Needle 31G X 5 MM Misc Use as directed in the morning, at noon, in the evening, and at bedtime.   isosorbide mononitrate 20 MG tablet Commonly known as: ISMO Take 1 tablet (20 mg total) by mouth daily.   Lantus SoloStar 100 UNIT/ML Solostar Pen Generic drug: insulin glargine Inject 60 Units into the skin daily.   losartan 100 MG tablet Commonly known as: COZAAR Take 100 mg by mouth daily.   Omnipod 5 G7 Intro (Gen 5) Kit 1 Device by Does not apply route every other day.   valsartan 320 MG tablet Commonly known as: DIOVAN Take 1 tablet (320 mg total) by mouth daily.   varenicline 1 MG tablet Commonly known as: Chantix Continuing Month Pak Take 1 tablet (1 mg total) by mouth 2 (two) times daily.  ALLERGIES: Allergies  Allergen Reactions   Lamictal [Lamotrigine] Itching   Strawberry Extract Hives   Sulfa Antibiotics Hives and Itching     REVIEW OF SYSTEMS: A comprehensive ROS was conducted with the  patient and is negative except as per HPI     OBJECTIVE:   VITAL SIGNS: BP 114/76 (BP Location: Left Arm, Patient Position: Sitting, Cuff Size: Large)   Pulse 72   Ht 5\' 4"  (1.626 m)   Wt 226 lb (102.5 kg)   LMP 10/21/2023 (Exact Date)   SpO2 95%   BMI 38.79 kg/m    PHYSICAL EXAM:  General: Pt appears well and is in NAD  Lungs: Clear with good BS bilat   Heart: RRR   Extremities: Left BKA   Neuro: MS is good with appropriate affect, pt is alert and Ox3   DM Foot Exam 06/30/2023  Left below-knee amputation  Right great toe amputation  the pedal pulses 1+ on right  The sensation is absent on the right   DATA REVIEWED:  Lab Results  Component Value Date   HGBA1C 10.9 (A) 06/30/2023   HGBA1C 10.9 (H) 04/05/2023   HGBA1C 8.8 (A) 12/15/2022    Latest Reference Range & Units 11/01/23 09:36  Sodium 135 - 145 mmol/L 137  Potassium 3.5 - 5.1 mmol/L 4.9  Chloride 98 - 111 mmol/L 109  CO2 22 - 32 mmol/L 19 (L)  Glucose 70 - 99 mg/dL 161 (H)  BUN 6 - 20 mg/dL 38 (H)  Creatinine 0.96 - 1.00 mg/dL 0.45 (H)  Calcium 8.9 - 10.3 mg/dL 8.7 (L)  Anion gap 5 - 15  9  GFR, Estimated >60 mL/min 28 (L)    ASSESSMENT / PLAN / RECOMMENDATIONS:   1) Type 2 Diabetes Mellitus, Poorly  controlled, With neuropathic complications, S/P Left BKA and Right great toe amputation  - Most recent A1c of 10.2 %. Goal A1c < 7.0 %.    -Patient continues with hyperglycemia -I have reviewed her CGM/pump download.  She has been bolusing correctly.  She has been noted with intermittent CGM-OmniPod use, I did explain to the patient the importance of using her CGM/OmniPod appropriately and there should not be any interruption in her insulin intake -Her OmniPod continues to be a manual mode, I again explained to the patient the importance of having this in the automatic mode at all times possible -I have increased her basal rate and adjusted her sensitivity factor as below -She will continue to use 14 g with  each meal, she may add or deduct to grams according to the size of the meal - She has a hx of Pancreatitis , not a candidate for DPP 4 inhibitors and GLP-1 agonist - Has hx of DKA , would avoid SGLT2 inhibitors     MEDICATIONS: Humalog   Pump   OmniPod Settings   Insulin type   Humalog   Basal rate       0000 2.10u/h       I:C ratio       0000  1:1    Enter 14               Sensitivity       0000  20      Goal       0000  110           EDUCATION / INSTRUCTIONS: BG monitoring instructions: Patient is instructed to check her blood sugars 3 times a day, before meals . Call  [place] [facility] if: BG persistently < 70  I reviewed the Rule of 15 for the treatment of hypoglycemia in detail with the patient. Literature supplied.   2) Diabetic complications:  Eye: Does not have known diabetic retinopathy.  Neuro/ Feet: Does  have known diabetic peripheral neuropathy. Renal: Patient does not have known baseline CKD. She is not on an ACEI/ARB at present.   Follow-up in 3 months  Signed electronically by: Lyndle Herrlich, MD  [facility] Endocrinology  [facility] [address] La[address] [place], [place] [address] Phone: [phone] FAX: [phone]   CC: [name], MD [address] [place] [address] Phone: [phone]  Fax: [phone]    Return to Endocrinology clinic as below: Future Appointments  Date Time Provider Department Center  12/05/2023  2:00 PM Myrtie Neither [facility] None  12/22/2023 10:30 AM [name], MD [facility] None  12/26/2023  9:40 AM Carlis Abbott Drema Pry, MD [facility] CPR  01/17/2024  2:30 PM [place], Shawn Route [facility] None

## 2023-11-25 NOTE — Telephone Encounter (Signed)
H&V Care Navigation CSW Progress Note  Clinical Social Worker received call from pt regarding water bill assistance- owes over $300.  Patient has maxed out patient care fund assistance and is not eligible for further assistance at this time.  Referred patient to other local resources.  Encouraged her to speak with her church regarding assistance.  Patient is participating in a Managed Medicaid Plan:  Yes  SDOH Screenings   Food Insecurity: Food Insecurity Present (08/10/2023)  Housing: Low Risk  (08/10/2023)  Transportation Needs: Unmet Transportation Needs (08/10/2023)  Utilities: At Risk (08/10/2023)  Alcohol Screen: Low Risk  (04/01/2023)  Depression (PHQ2-9): Low Risk  (08/16/2023)  Recent Concern: Depression (PHQ2-9) - Medium Risk (06/28/2023)  Financial Resource Strain: High Risk (10/19/2023)  Physical Activity: Insufficiently Active (04/01/2023)  Social Connections: Moderately Isolated (04/01/2023)  Stress: Stress Concern Present (04/01/2023)  Tobacco Use: Medium Risk (11/25/2023)    Burna Sis, LCSW Clinical Social Worker [facility] Desk#: [phone] Cell#: [phone]

## 2023-11-25 NOTE — Patient Instructions (Signed)

## 2023-12-05 ENCOUNTER — Ambulatory Visit (HOSPITAL_COMMUNITY): Payer: Medicaid Other | Admitting: Mental Health

## 2023-12-05 DIAGNOSIS — F319 Bipolar disorder, unspecified: Secondary | ICD-10-CM

## 2023-12-05 DIAGNOSIS — F4310 Post-traumatic stress disorder, unspecified: Secondary | ICD-10-CM

## 2023-12-13 ENCOUNTER — Encounter: Payer: Self-pay | Admitting: Internal Medicine

## 2023-12-15 DIAGNOSIS — H40013 Open angle with borderline findings, low risk, bilateral: Secondary | ICD-10-CM | POA: Diagnosis not present

## 2023-12-15 DIAGNOSIS — D492 Neoplasm of unspecified behavior of bone, soft tissue, and skin: Secondary | ICD-10-CM | POA: Diagnosis not present

## 2023-12-18 NOTE — Progress Notes (Signed)
Established Patient Office Visit  Subjective:  Patient ID: Sarah Long, female    DOB: 1971/05/25  Age: 53 y.o. MRN: [id number]  CC:  Chief Complaint  Patient presents with   Medical Management of Chronic Issues    Patient has question about her pancreas     HPI 11/2021 Sarah Long presents for primary care follow-up and diabetes management  Since last seen in October the patient has continued improvement.  She has been followed closely by endocrinology and her A1c is now down to 7.4.  It had been greater than 12.  The patient also now has a Dexcom glucometer and has showing data suggesting she is staying in the 100-150 range of her blood sugars.  The patient has had a left below-knee amputation now has a functional orthotic.  Patient is followed by rehab.  Patient's phantom limb pain is being managed by the rehab physician and she states is markedly better.  She has had 1 toe amputated on the right foot.  Recently developed a sore internally in her mouth.  This patient has poor dentition and needs to be seen by dentist.  Also she is living in a hotel and may be losing her housing soon and needs assistance in this regard.  Blood pressure is good on arrival 132/87  4/17 Patient seen in return follow-up unfortunately not been taking her oral medications because she cannot afford the co-pay.  She is taking her insulin products.  On arrival blood pressure is elevated 145/102.  She does have a Dexcom meter and recently saw endocrinology.  Below is documentation from that visit.  Endo 02/2022 Today (02/03/22): Sarah Long is here for a follow up on diabetes management. She checks her  blood sugars multiple times daily through CGM.  The patient has  had hypoglycemic episodes since the last clinic visit, which typically occur rarely and after a bolus.     She has made lifestyle changes with weight loss  Has noted prosthetics    HOME DIABETES REGIMEN: Lantus 35 units daily - takes 45  Humalog  10 units TID QAC- 10-15 units  CF: Humalog (BG -130/25)      Statin: no ACE-I/ARB: no Prior Diabetic Education: yes  Glycemic patterns summary: BG's within goal at night, high during the day    Hyperglycemic episodes postprandial    Hypoglycemic episodes occurred post bolus   Overnight periods: trends down       DIABETIC COMPLICATIONS: Microvascular complications:  Left BKA, Right  great toe amputation , neuropathy  Denies: CKD, retinopathy  Last eye exam: Completed 01/2021   Macrovascular complications:  PAD Denies: CAD, CVA   1) Type 2 Diabetes Mellitus, Sub-Optimally controlled, With neuropathic complications, S/P Left BKA and Right great toe amputation  - Most recent A1c of 7.9 %. Goal A1c < 7.0 %.       - A1c remains above goal - She unfortunately has self adjusted her insulin regimen, it appears that she has not been using the correction scale, she does admit to guessing on prandial dose has occasional hypoglycemia -We discussed the importance of following instructions so that we make the appropriate changes were necessary -In review of her CGM, I am going to increase her basal as well as prandial dose, she will be provided with a new correction scale - She has a hx of Pancreatitis , not a candidate for DPP 4 inhibitors and GLP-1 agonist - Has hx of DKA , would avoid SGLT2 inhibitors  MEDICATIONS: Increase Lantus to 50 units daily  Take Humalog 12 units TIDQAC Correction Factor: Humalog (BG -130/25)    EDUCATION / INSTRUCTIONS: BG monitoring instructions: Patient is instructed to check her blood sugars 3 times a day, before meals . Call [facility] if: BG persistently < 70  I reviewed the Rule of 15 for the treatment of hypoglycemia in detail with the patient. Literature supplied.     2) Diabetic complications:  Eye: Does not have known diabetic retinopathy.  Neuro/ Feet: Does  have known diabetic peripheral neuropathy. Renal: Patient  does not have known baseline CKD. She is not on an ACEI/ARB at present.     3) Dyslipidemia :     - She is not on atorvastatin, patient does not like taking medication and would rather use natural route if possible -We discussed the increased risk of cardiovascular disease as well as CVA with hyperlipidemia in the setting of diabetes   4) Microalbuminuria:       -She has losartan listed but she is not taking it.  We discussed the renal benefits with ARB and I have encouraged her to consider taking it   Medication Restart losartan 25 mg daily   Patient never restarted the losartan or atorvastatin because she cannot afford the $4 co-pay.  She is using insulin as prescribed.  She still living in a motel with her children but is having difficulty affording the rent.  9/5 Patient seen in return follow-up and is followed by cardiology closely for her cardiomyopathy nonischemic.  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  She is undergoing genetic testing for this.  She does have a pacemaker in place.  Cardiac medicines have been adjusted by cardiology.  Patient is also followed by endocrinology and she is on 70 units of Lantus and a sliding scale Humalog average dose of 14 units before meals.  She is trying to get set up for the insulin pump but is yet to achieve this.  She was seen by ophthalmology in April of this year and found to not have diabetic eye disease.  She is due a colon cancer screening and agrees to receive the Cologuard kit.  She needs dental care follow-up.  Also needs mental health follow-up.  She cannot take Latuda because of her cardiac disease and had itching with Lamictal.  She is due a flu vaccine and agrees to receive this is also due foot exam at this visit  01/13/23 Patient is seen in return follow-up has not been seen since last September.  Patient's had difficulty with her insurance partially approving some of her diabetic supplies including her insulin pump  and Dexcom 6.  She has been followed by endocrinology documentation of the last visit is as below.  She still has elevated A1c.  She is on long and short acting insulin at this time.  She is not a candidate for a GLP-1 because of pancreatitis in the past.  Patient does have constipation as well.  She was off cigarettes with use of Chantix she ran out of Chantix now she is back smoking.  Also has mild excess bleeding and cervical vaginal discharge she may yet need gynecology referral.  She also is having some dysuria.  She has a blood pressure meter at home but does not take the blood pressure.  Blood pressure today on arrival is elevated 166/103. Patient is now out of the hotel living in a home.  Her children are with her.  Below is a recent endocrinology note 1) Type 2 Diabetes Mellitus, Poorly  controlled, With neuropathic complications, S/P Left BKA and Right great toe amputation  - Most recent A1c of 8.8 %. Goal A1c < 7.0 %.       - A1c remains above goal - She has a hx of Pancreatitis , not a candidate for DPP 4 inhibitors and GLP-1 agonist - Has hx of DKA , would avoid SGLT2 inhibitors -She has not been on the pump for the past 3 weeks due to insurance coverage, she is working with her current insurance and will be able to eventually stay on Medicaid and will obtain the OmniPod pump.  Patient liked the pump and would like to stay on it -Despite her elevated A1c, we have been able to download her pump through December, her average BG's at the time 142 mg/DL, within range BG's have improved from 26% to 74%.  And hyperglycemia has improved from 44% down to 20%. -Overall if she is able to continue on the pump this will be a better option, the patient was advised that if she has no Dexcom she should continue to check glucose before each meal and entered into the pump while in the manual mode -I also emphasized the importance of being on basal insulin if she is not going to be on the pump for 24 hours  or more -A refill of the Lantus was prescribed to use when she is of the pump, patient was advised to continue to use Humalog 14 units with each meal -Unable to make any changes to the pump settings as the patient did not bring her PDM     MEDICATIONS:   -Take Lantus 60 units daily while of OmniPod -Continue Humalog 14 units with each meal -Patient will return to the current pump settings once the insurance issues have been resolved   Below is recent cardiology note the patient appears to have a form of amyloidosis and is undergoing consideration for treatment at [facility] 1. Chronic systolic CHF: Echo in 3/21 with EF 40-45%, severe LV hypertrophy.  This was found in association with complete heart block.  Nonischemic cardiomyopathy, cath in 3/21 showed no significant CAD.  PYP scan (6/21) not suggestive of TTR amyloidosis. cMRI (2/23) showed LVEF 68%, severe LVH, ECV 54%, no LVOT gradient, poor nulling with uninterpretable delayed enhancement. CTA chest did not show evidence for pulmonary sarcoidosis.  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  There is wide phenotypic variation in female Fabry's heterozygotes, but I suspect that the cardiomyopathy with LVH and h/o complete heart block in this situation is most likely due to Fabry's disease. She is not volume overloaded on exam with NYHA class II-III symptoms.  - We will continue to work to get her an appt at Dr. Hilda Lias McDonald's office at [facility]. She will need treatment with Fabrazyme (enzyme replacement).  - Continue hydralazine 25 mg tid + Imdur 20 mg daily.  - Continue Lasix 40 qam/20 qpm. BMET today.  - Continue spironolactone 25 mg daily.  - Continue Coreg 6.25 mg bid. - Continue losartan 50 mg daily. - Echo at followup appt.  2. HTN: Mildly elevated today, but generally well-controlled at home. No changes for now. 3. Hyperlipidemia: Good lipids 5/23. 4.  H/o smoking: Quit x 1 month.    - Continue Chantix. 5. Type 2 diabetes: Followed by Endocrinology. She is on insulin.  6. Complete heart block: She has a St Jude PPM and is pacing her RV > 99% of the time.  It is possible that this could be related to Fabry's disease.  7. Obesity: Body mass index is 36.39 kg/m. - Considered referral for GLP1RA, but she has history of pancreatitis. 8. CKD stage 3: Suspect diabetic nephropathy.  This may be related to Fabry's disease as well.  - BMET today.   03/15/23 she has been without medication for 1 month due to insurance barriers.  The patient is seen in return follow-up she now has achieved Medicaid and can get medications again unfortunately with this blood pressure is elevated as is blood sugar.  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  She has changes in the right foot and may need to orthopedics again.  She does have housing and is renting this.  She is no longer drinking alcohol.  04/27/23 Patient seen in return follow-up comes in a very depressed tearful state.  She needs behavioral health history of bipolar not on medications.  She stays exhausted.  She has been off bipolar meds for 5 years.  Blood pressure is elevated 159/83 she takes her blood pressure at home it is around 138/88.  Patient had been recently seen by cardiology labs showed elevated potassium she stopped her Aldactone and losartan from cardiology and today blood pressure remains high she also has a vaginal discharge and did have a urine culture with cardiology UA showed hematuria and increased protein and increased glucose.  Patient needs medication refills.  She has a G6 transmitter and this needs to be refilled we did message endocrinology they will take care of this.  We also message cardiology about what is the follow-up without Aldactone and losartan  Orthopedic recently saw the patient and stated her right foot was improved  06/29/23 Patient seen in return  follow-up on arrival blood pressure 70/101 blood sugar 324.  The patient has an Omnipod and a CGM monitor but has not set these up as of yet.  There is been barriers with the insurance.  She has not been using her longer short acting KwikPen insulins.  She is also stopped all of her blood pressure heart medicines.  She became confused when cardiology asked her to hold a few of her medicine she held all of them.  Also complains of a rash in her back.  Patient does have a follow-up appointment this Thursday with endocrinology.  She was considering to skip this.  She has been encouraged to keep this appointment.   9/10 The patient seen in return follow-up from July visit.  Blood pressure is great on arrival 115/75.  Patient's blood sugar however remains elevated.  She has a Dexcom G7 and it shows most blood sugars are greater than 200.  Patient recently seen by endocrinology as below. Type 2 Diabetes Mellitus, Poorly  controlled, With neuropathic complications, S/P Left BKA and Right great toe amputation  - Most recent A1c of 10.6 %. Goal A1c < 7.0 %.       -Poorly controlled diabetes due to medication nonadherence -She has not used the Dexcom nor the OmniPod since the end of 2023, she does have pump supplies but does not recall how to put them back on, she has not been using multiple daily injections of insulin despite my advised to start back in January 2024 -She just resumed taking her insulin 2 days ago ???? -She does understand the  risk of more amputations as well as CKD and vision loss.  - She has a hx of Pancreatitis , not a candidate for DPP 4 inhibitors and GLP-1 agonist - Has hx of DKA , would avoid SGLT2 inhibitors -Today I have prescribed Lantus pens, Humalog pens and pen needles -I have put in a new referral for retraining on the OmniPod -A new prescription for Dexcom as well as a sensor sample was provided today for Dexcom G7   MEDICATIONS:   -Take Lantus 60 units daily while of  OmniPod -Take Humalog 14 units with each meal -CF : Humalog (BG goal morning-130/25) [facility]   The patient now has the OmniPod but when it is not working she takes the above doses of Lantus and Humalog.  Recent problem is she has had nausea and vomiting abdominal pain with a recent new medicine given for her Febres disease.  The patient also needs a Pap smear scheduled.  She agrees to receive the flu vaccine at this visit.  There are no other complaints  12/22/23 The patient is seen in return follow-up and followed closely by endocrinology for her severe type 2 diabetes.  The patient agrees to and received a flu vaccine.  She is on [place] but has renal insufficiency.  She is maintained on the Omnipod insulin dosed by endocrinology.  She needs follow-up labs at this visit.  She does complain of occasional dyspnea.      Past Medical History:  Diagnosis Date   Abnormal Pap smear 1998   Abnormal vaginal bleeding 12/19/2011   Acute urinary retention 06/26/2021   Arthritis    Bacterial infection    Bipolar 1 disorder (HCC)    Blister of second toe of left foot 11/21/2016   Candida vaginitis 07/2007   Depression    recently added wellbutrin-has not taken yet for bipolar   Diabetes in pregnancy    Diabetes mellitus    nph 20U qam and qpm, regular with meals   Diabetic ketoacidosis without coma associated with type 2 diabetes mellitus (HCC)    Fibroid    Galactorrhea of right breast 2008   GERD (gastroesophageal reflux disease)    H/O amenorrhea 07/2007   H/O dizziness 10/14/2011   H/O dysmenorrhea 2010   H/O menorrhagia 10/14/2011   H/O varicella    Headache(784.0)    Heavy vaginal bleeding due to contraceptive injection use 10/12/2011   Depo Provera   Herpes    Homelessness 11/19/2021   HSV-2 infection 01/03/2009   Hx: UTI (urinary tract infection) 2009   Hypertension    on aldomet   Hypoalbuminemia due to protein-calorie malnutrition (HCC) 06/26/2021   Increased BMI 2010    Irregular uterine bleeding 04/04/2012   Pt has mirena    Obesity 10/14/2011   Oligomenorrhea 07/2007   Osteomyelitis of great toe of right foot (HCC) 03/22/2022   Pelvic pain in female    Presence of permanent cardiac pacemaker    Preterm labor    PTSD (post-traumatic stress disorder)    Trichomonas    Yeast infection     Past Surgical History:  Procedure Laterality Date   ABDOMINAL AORTOGRAM W/LOWER EXTREMITY N/A 06/17/2021   Procedure: ABDOMINAL AORTOGRAM W/LOWER EXTREMITY;  Surgeon: Iran Ouch, MD;  Location: [facility] IN[facility]ASIVE [facility] LAB;  Service: Cardiovascular;  Laterality: N/A;   AMPUTATION Left 06/27/2021   Procedure: AMPUTATION BELOW KNEE;  Surgeon: Nadara Mustard, MD;  Location: [facility] OR;  Service: Orthopedics;  Laterality: Left;   AMPUTATION Right  09/04/2021   Procedure: RIGHT GREAT TOE AMPUTATION;  Surgeon: Nadara Mustard, MD;  Location: [facility];  Service: Orthopedics;  Laterality: Right;   CESAREAN SECTION  1991   LEFT HEART CATH AND CORONARY ANGIOGRAPHY N/A 02/10/2020   Procedure: LEFT HEART CATH AND CORONARY ANGIOGRAPHY;  Surgeon: Lennette Bihari, MD;  Location: [facility] INVASIVE CV LAB;  Service: Cardiovascular;  Laterality: N/A;   PACEMAKER IMPLANT N/A 02/13/2020   Procedure: PACEMAKER IMPLANT;  Surgeon: Regan Lemming, MD;  Location: [facility];  Service: Cardiovascular;  Laterality: N/A;   TEMPORARY PACEMAKER N/A 02/10/2020   Procedure: TEMPORARY PACEMAKER;  Surgeon: Lennette Bihari, MD;  Location[facility] INVASIVE CV LAB;  Service: Cardiovascular;  Laterality: N/A;    Family History  Problem Relation Age of Onset   Hypertension Mother    Diabetes Mother    Heart disease Mother    Hypertension Father    Diabetes Father    Heart disease Father    Depression Maternal Grandmother    Suicidality Maternal Grandmother    Stroke Maternal Grandfather    Schizophrenia Paternal Grandmother    Other Neg Hx    Breast cancer Neg Hx     Social History   Socioeconomic History    Marital status: Significant Other    Spouse name: Not on file   Number of children: Not on file   Years of education: Not on file   Highest education level: Some college, no degree  Occupational History   Not on file  Tobacco Use   Smoking status: Former    Current packs/day: 0.00    Average packs/day: 0.5 packs/day for 20.0 years (10.0 ttl pk-yrs)    Types: Cigarettes    Start date: 07/01/2001    Quit date: 07/01/2021    Years since quitting: 2.4   Smokeless tobacco: Never   Tobacco comments:    1. Centrix to stop smoking   Vaping Use   Vaping status: Never Used  Substance and Sexual Activity   Alcohol use: Not Currently    Comment: denies recently; history of excessive use   Drug use: No   Sexual activity: Yes    Partners: Female, Female    Birth control/protection: None  Other Topics Concern   Not on file  Social History Narrative   Not on file   Social Drivers of Health   Financial Resource Strain: High Risk (12/22/2023)   Overall Financial Resource Strain (CARDIA)    Difficulty of Paying Living Expenses: Hard  Food Insecurity: Food Insecurity Present (12/22/2023)   Hunger Vital Sign    Worried About Running Out of Food in the Last Year: Sometimes true    Ran Out of Food in the Last Year: Sometimes true  Transportation Needs: No Transportation Needs (12/22/2023)   PRAPARE - Administrator, Civil Service (Medical): No    Lack of Transportation (Non-Medical): No  Physical Activity: Insufficiently Active (12/22/2023)   Exercise Vital Sign    Days of Exercise per Week: 3 days    Minutes of Exercise per Session: 30 min  Stress: Stress Concern Present (12/22/2023)   [employer] of Occupational Health - Occupational Stress Questionnaire    Feeling of Stress : Very much  Social Connections: Moderately Integrated (12/22/2023)   Social Connection and Isolation Panel [NHANES]    Frequency of Communication with Friends and Family: More than three times a week     Frequency of Social Gatherings with Friends and Family: More than  three times a week    Attends Religious Services: More than 4 times per year    Active Member of Clubs or Organizations: Yes    Attends Banker Meetings: More than 4 times per year    Marital Status: Divorced  Intimate Partner Violence: Not At Risk (08/10/2023)   Humiliation, Afraid, Rape, and Kick questionnaire    Fear of Current or Ex-Partner: No    Emotionally Abused: No    Physically Abused: No    Sexually Abused: No    Outpatient Medications Prior to Visit  Medication Sig Dispense Refill   ARIPiprazole (ABILIFY) 15 MG tablet Take 1 tablet (15 mg total) by mouth daily. 30 tablet 2   CARESTART COVID-19 HOME TEST KIT See admin instructions.     Continuous Glucose Sensor (DEXCOM G7 SENSOR) MISC 1 Device by Does not apply route as directed. 9 each 3   dapagliflozin propanediol (FARXIGA) 10 MG TABS tablet Take 1 tablet (10 mg total) by mouth daily before breakfast. 30 tablet 11   fluticasone (FLONASE) 50 MCG/ACT nasal spray Place 1 spray into both nostrils daily. 15.8 mL 0   furosemide (LASIX) 20 MG tablet Take 3 tablets (60 mg total) by mouth daily. You may take 2 additional tablets as needed for swelling for a weight gain of 3 pounds or more in 24 hours or 5 pounds in 1 week. 120 tablet 11   GALAFOLD 123 MG CAPS Take by mouth.     hydrOXYzine (ATARAX) 25 MG tablet Take 1 tablet (25 mg total) by mouth 2 (two) times daily as needed for anxiety (or sleep). 60 tablet 2   Insulin Disposable Pump (OMNIPOD 5 G7 INTRO, GEN 5,) KIT 1 Device by Does not apply route every other day. 1 kit 0   insulin glargine (LANTUS SOLOSTAR) 100 UNIT/ML Solostar Pen Inject 60 Units into the skin daily. 60 mL 3   insulin lispro (HUMALOG) 100 UNIT/ML KwikPen Inject 14 Units into the skin. Before meals daily     Insulin Pen Needle 31G X 5 MM MISC Use as directed in the morning, at noon, in the evening, and at bedtime. 400 each 3    varenicline (CHANTIX CONTINUING MONTH PAK) 1 MG tablet Take 1 tablet (1 mg total) by mouth 2 (two) times daily. 60 tablet 1   atorvastatin (LIPITOR) 20 MG tablet Take 1 tablet (20 mg total) by mouth daily. 90 tablet 3   carvedilol (COREG) 12.5 MG tablet Take 1 tablet (12.5 mg total) by mouth 2 (two) times daily with a meal. 120 tablet 1   hydrALAZINE (APRESOLINE) 50 MG tablet Take one three times daily 180 tablet 1   isosorbide mononitrate (ISMO) 20 MG tablet Take 1 tablet (20 mg total) by mouth daily. 90 tablet 3   losartan (COZAAR) 100 MG tablet Take 100 mg by mouth daily.     valsartan (DIOVAN) 320 MG tablet Take 1 tablet (320 mg total) by mouth daily. 90 tablet 3   amoxicillin-clavulanate (AUGMENTIN) 875-125 MG tablet Take 1 tablet by mouth every 12 (twelve) hours. (Patient not taking: Reported on 11/25/2023) 14 tablet 0   No facility-administered medications prior to visit.    Allergies  Allergen Reactions   Lamictal [Lamotrigine] Itching   Strawberry Extract Hives   Sulfa Antibiotics Hives and Itching    ROS Review of Systems  Constitutional:  Negative for chills, diaphoresis, fever and unexpected weight change.  HENT: Negative.  Negative for congestion, ear pain, hearing loss, nosebleeds, postnasal  drip, rhinorrhea, sinus pressure, sore throat, tinnitus, trouble swallowing and voice change.   Eyes: Negative.  Negative for photophobia and redness.  Respiratory:  Positive for shortness of breath. Negative for apnea, cough, choking, chest tightness, wheezing and stridor.   Cardiovascular: Negative.  Negative for chest pain, palpitations and leg swelling.  Gastrointestinal: Negative.  Negative for abdominal distention, abdominal pain, blood in stool, constipation, diarrhea, nausea and vomiting.  Endocrine: Negative for polydipsia.  Genitourinary:  Negative for dysuria, flank pain, frequency, hematuria, urgency and vaginal discharge.  Musculoskeletal: Negative.  Negative for  arthralgias, back pain, myalgias and neck pain.  Skin:  Positive for rash.  Allergic/Immunologic: Negative.  Negative for environmental allergies and food allergies.  Neurological: Negative.  Negative for dizziness, tremors, seizures, syncope, weakness and headaches.  Hematological: Negative.  Negative for adenopathy. Does not bruise/bleed easily.  Psychiatric/Behavioral:  Negative for agitation, behavioral problems, decreased concentration, dysphoric mood, self-injury, sleep disturbance and suicidal ideas. The patient is nervous/anxious.       Objective:    Physical Exam Vitals reviewed.  Constitutional:      Appearance: Normal appearance. She is well-developed. She is obese. She is not diaphoretic.  HENT:     Head: Normocephalic and atraumatic.     Nose: No nasal deformity, septal deviation, mucosal edema or rhinorrhea.     Right Sinus: No maxillary sinus tenderness or frontal sinus tenderness.     Left Sinus: No maxillary sinus tenderness or frontal sinus tenderness.     Mouth/Throat:     Mouth: Mucous membranes are moist.     Pharynx: No oropharyngeal exudate.     Comments: There is a slight ulceration inside the right cheek mucosa and there is a fractured molar which likely has caused the injury Eyes:     General: No scleral icterus.    Conjunctiva/sclera: Conjunctivae normal.     Pupils: Pupils are equal, round, and reactive to light.  Neck:     Thyroid: No thyromegaly.     Vascular: No carotid bruit or JVD.     Trachea: Trachea normal. No tracheal tenderness or tracheal deviation.  Cardiovascular:     Rate and Rhythm: Normal rate and regular rhythm.     Chest Wall: PMI is not displaced.     Pulses: Normal pulses. No decreased pulses.     Heart sounds: Normal heart sounds, S1 normal and S2 normal. Heart sounds not distant. No murmur heard.    No systolic murmur is present.     No diastolic murmur is present.     No friction rub. No gallop. No S3 or S4 sounds.  Pulmonary:      Effort: No tachypnea, accessory muscle usage or respiratory distress.     Breath sounds: No stridor. No decreased breath sounds, wheezing, rhonchi or rales.  Chest:     Chest wall: No tenderness.  Abdominal:     General: Bowel sounds are normal. There is no distension.     Palpations: Abdomen is soft. Abdomen is not rigid.     Tenderness: There is no abdominal tenderness. There is no guarding or rebound.  Musculoskeletal:        General: Normal range of motion.     Cervical back: Normal range of motion and neck supple. No edema, erythema or rigidity. No muscular tenderness. Normal range of motion.     Right lower leg: No tenderness. No edema.     Comments: Orthotic on left below-knee amputation left leg functioning well Blackened second toe  right foot stable  Lymphadenopathy:     Head:     Right side of head: No submental or submandibular adenopathy.     Left side of head: No submental or submandibular adenopathy.     Cervical: No cervical adenopathy.  Skin:    General: Skin is warm and dry.     Coloration: Skin is not pale.     Findings: No rash.     Nails: There is no clubbing.  Neurological:     General: No focal deficit present.     Mental Status: She is alert and oriented to person, place, and time.     Sensory: No sensory deficit.  Psychiatric:        Attention and Perception: Attention and perception normal.        Mood and Affect: Mood is anxious and depressed. Affect is tearful.        Speech: Speech normal.        Behavior: Behavior normal. Behavior is cooperative.        Thought Content: Thought content normal. Thought content does not include homicidal or suicidal ideation. Thought content does not include homicidal or suicidal plan.        Cognition and Memory: Cognition normal.        Judgment: Judgment normal.     BP 120/73 (BP Location: Left Arm, Patient Position: Sitting, Cuff Size: Normal)   Pulse 70   Wt 230 lb (104.3 kg)   SpO2 96%   BMI 39.48 kg/m   Wt Readings from Last 3 Encounters:  12/22/23 230 lb (104.3 kg)  11/25/23 226 lb (102.5 kg)  11/11/23 215 lb (97.5 kg)     Health Maintenance Due  Topic Date Due   Zoster Vaccines- Shingrix (1 of 2) Never done   Fecal DNA (Cologuard)  Never done   OPHTHALMOLOGY EXAM  03/11/2023   INFLUENZA VACCINE  07/07/2023   Diabetic kidney evaluation - Urine ACR  01/14/2024     There are no preventive care reminders to display for this patient.  Lab Results  Component Value Date   TSH 4.396 02/11/2020   Lab Results  Component Value Date   WBC 7.0 10/02/2023   HGB 8.0 (L) 10/02/2023   HCT 25.5 (L) 10/02/2023   MCV 91.7 10/02/2023   PLT 230 10/02/2023   Lab Results  Component Value Date   NA 137 11/01/2023   K 4.9 11/01/2023   CO2 19 (L) 11/01/2023   GLUCOSE 148 (H) 11/01/2023   BUN 38 (H) 11/01/2023   CREATININE 2.10 (H) 11/01/2023   BILITOT <0.2 04/05/2023   ALKPHOS 79 04/05/2023   AST 11 04/05/2023   ALT 10 04/05/2023   PROT 6.2 04/05/2023   ALBUMIN 3.4 (L) 04/05/2023   CALCIUM 8.7 (L) 11/01/2023   ANIONGAP 9 11/01/2023   EGFR 49 (L) 06/28/2023   Lab Results  Component Value Date   CHOL 103 07/22/2023   Lab Results  Component Value Date   HDL 40 (L) 07/22/2023   Lab Results  Component Value Date   LDLCALC 39 07/22/2023   Lab Results  Component Value Date   TRIG 121 07/22/2023   Lab Results  Component Value Date   CHOLHDL 2.6 07/22/2023   Lab Results  Component Value Date   HGBA1C 10.2 (A) 11/25/2023      Assessment & Plan:   Problem List Items Addressed This Visit       Cardiovascular and Mediastinum   Heart block AV complete (  HCC)   Per cardiology      Relevant Medications   carvedilol (COREG) 12.5 MG tablet   atorvastatin (LIPITOR) 20 MG tablet   isosorbide mononitrate (ISMO) 20 MG tablet   valsartan (DIOVAN) 320 MG tablet   hydrALAZINE (APRESOLINE) 50 MG tablet   Chronic combined systolic and diastolic CHF (congestive heart failure)  (HCC)   Relevant Medications   carvedilol (COREG) 12.5 MG tablet   atorvastatin (LIPITOR) 20 MG tablet   isosorbide mononitrate (ISMO) 20 MG tablet   valsartan (DIOVAN) 320 MG tablet   hydrALAZINE (APRESOLINE) 50 MG tablet   Cardiac amyloidosis (HCC)   Per cardiology      Relevant Medications   carvedilol (COREG) 12.5 MG tablet   atorvastatin (LIPITOR) 20 MG tablet   isosorbide mononitrate (ISMO) 20 MG tablet   valsartan (DIOVAN) 320 MG tablet   hydrALAZINE (APRESOLINE) 50 MG tablet     Endocrine   DM type 2 with diabetic peripheral neuropathy (HCC) - Primary   Relevant Medications   atorvastatin (LIPITOR) 20 MG tablet   valsartan (DIOVAN) 320 MG tablet   Other Relevant Orders   Microalbumin / creatinine urine ratio     Other   Hypercholesteremia   Relevant Medications   carvedilol (COREG) 12.5 MG tablet   atorvastatin (LIPITOR) 20 MG tablet   isosorbide mononitrate (ISMO) 20 MG tablet   valsartan (DIOVAN) 320 MG tablet   hydrALAZINE (APRESOLINE) 50 MG tablet   Other Relevant Orders   Lipid panel   Hepatic Function Panel   Bipolar 1 disorder, depressed (HCC)   Under management by psychiatry      Left below-knee amputee [facility])   Still present      Presence of heart assist device [facility])   Per cardiology      Other Visit Diagnoses       Encounter for screening involving social determinants of health (SDoH)       Relevant Orders   AMB Referral VBCI Care Management     Class 3 severe obesity due to excess calories with serious comorbidity in adult, unspecified BMI (HCC)   (Chronic)         Cardiology contacted they will see the patient sooner 38 minutes spent complex decision making patient extremely upset follow-up: Return in about 6 months (around 06/20/2024) for htn, diabetes, heart failure.   Shan Levans, MD

## 2023-12-19 ENCOUNTER — Telehealth (HOSPITAL_COMMUNITY): Payer: Self-pay | Admitting: Licensed Clinical Social Worker

## 2023-12-19 NOTE — Telephone Encounter (Signed)
 H&V Care Navigation CSW Progress Note  Clinical Social Worker received call from pt requesting advice after getting call from medical debt collector asking about arranging payment for over $1000 in debt from medical visits.  CSW helped review patient account and informed that no evidence of bills being sent to collections since 2020- encouraged her to call [employer] to confirm and get more information on where those bills would have been sent so she can determine if this collections agency is legit or a potential fraud.  CSW also provided information regarding [facility] for free legal consultation to get sense of potential affects of this medical debt so she can figure out how to prioritize since she has difficulty keeping up with current living expenses and does not have resources to pay for this debt.  Patient is participating in a Managed Medicaid Plan:  Yes  SDOH Screenings   Food Insecurity: Food Insecurity Present (12/18/2023)  Housing: High Risk (12/18/2023)  Transportation Needs: No Transportation Needs (12/18/2023)  Utilities: At Risk (08/10/2023)  Alcohol Screen: Low Risk  (12/18/2023)  Depression (PHQ2-9): Low Risk  (08/16/2023)  Recent Concern: Depression (PHQ2-9) - Medium Risk (06/28/2023)  Financial Resource Strain: High Risk (12/18/2023)  Physical Activity: Insufficiently Active (04/01/2023)  Social Connections: Moderately Integrated (12/18/2023)  Stress: Stress Concern Present (04/01/2023)  Tobacco Use: Medium Risk (11/25/2023)   Sarah HILARIO Leech, LCSW Clinical Social Worker [facility] Desk#: [phone] Cell#: [phone]

## 2023-12-22 ENCOUNTER — Ambulatory Visit: Payer: Medicaid Other | Attending: Critical Care Medicine | Admitting: Critical Care Medicine

## 2023-12-22 ENCOUNTER — Encounter: Payer: Self-pay | Admitting: Critical Care Medicine

## 2023-12-22 VITALS — BP 120/73 | HR 70 | Wt 230.0 lb

## 2023-12-22 DIAGNOSIS — I43 Cardiomyopathy in diseases classified elsewhere: Secondary | ICD-10-CM

## 2023-12-22 DIAGNOSIS — Z794 Long term (current) use of insulin: Secondary | ICD-10-CM

## 2023-12-22 DIAGNOSIS — E66813 Obesity, class 3: Secondary | ICD-10-CM

## 2023-12-22 DIAGNOSIS — I5042 Chronic combined systolic (congestive) and diastolic (congestive) heart failure: Secondary | ICD-10-CM | POA: Diagnosis not present

## 2023-12-22 DIAGNOSIS — E66812 Obesity, class 2: Secondary | ICD-10-CM

## 2023-12-22 DIAGNOSIS — E1142 Type 2 diabetes mellitus with diabetic polyneuropathy: Secondary | ICD-10-CM

## 2023-12-22 DIAGNOSIS — Z6839 Body mass index (BMI) 39.0-39.9, adult: Secondary | ICD-10-CM | POA: Diagnosis not present

## 2023-12-22 DIAGNOSIS — Z95811 Presence of heart assist device: Secondary | ICD-10-CM | POA: Diagnosis not present

## 2023-12-22 DIAGNOSIS — E7800 Pure hypercholesterolemia, unspecified: Secondary | ICD-10-CM

## 2023-12-22 DIAGNOSIS — Z89512 Acquired absence of left leg below knee: Secondary | ICD-10-CM

## 2023-12-22 DIAGNOSIS — F319 Bipolar disorder, unspecified: Secondary | ICD-10-CM | POA: Diagnosis not present

## 2023-12-22 DIAGNOSIS — E854 Organ-limited amyloidosis: Secondary | ICD-10-CM

## 2023-12-22 DIAGNOSIS — I442 Atrioventricular block, complete: Secondary | ICD-10-CM | POA: Diagnosis not present

## 2023-12-22 DIAGNOSIS — Z139 Encounter for screening, unspecified: Secondary | ICD-10-CM

## 2023-12-22 MED ORDER — VALSARTAN 320 MG PO TABS: 320.0000 mg | ORAL_TABLET | Freq: Every day | ORAL | 3 refills | Status: DC

## 2023-12-22 MED ORDER — ISOSORBIDE MONONITRATE 20 MG PO TABS: 20.0000 mg | ORAL_TABLET | Freq: Every day | ORAL | 3 refills | Status: DC

## 2023-12-22 MED ORDER — HYDRALAZINE HCL 50 MG PO TABS: ORAL_TABLET | ORAL | 1 refills | Status: DC

## 2023-12-22 MED ORDER — ATORVASTATIN CALCIUM 20 MG PO TABS: 20.0000 mg | ORAL_TABLET | Freq: Every day | ORAL | 3 refills | Status: DC

## 2023-12-22 MED ORDER — CARVEDILOL 12.5 MG PO TABS: 12.5000 mg | ORAL_TABLET | Freq: Two times a day (BID) | ORAL | 1 refills | Status: DC

## 2023-12-22 NOTE — Assessment & Plan Note (Signed)
Still present.

## 2023-12-22 NOTE — Patient Instructions (Signed)
Labs today Medication refills Return 6 months

## 2023-12-22 NOTE — Assessment & Plan Note (Signed)
Per cardiology 

## 2023-12-22 NOTE — Assessment & Plan Note (Signed)
Under management by psychiatry

## 2023-12-23 ENCOUNTER — Telehealth: Payer: Self-pay

## 2023-12-23 LAB — LIPID PANEL
Chol/HDL Ratio: 3.4 {ratio} (ref 0.0–4.4)
Cholesterol, Total: 154 mg/dL (ref 100–199)
HDL: 45 mg/dL (ref >39)
LDL Chol Calc (NIH): 82 mg/dL (ref 0–99)
Triglycerides: 156 mg/dL — ABNORMAL HIGH (ref 0–149)
VLDL Cholesterol Cal: 27 mg/dL (ref 5–40)

## 2023-12-23 LAB — HEPATIC FUNCTION PANEL
ALT: 12 [IU]/L (ref 0–32)
AST: 14 [IU]/L (ref 0–40)
Albumin: 3.6 g/dL — ABNORMAL LOW (ref 3.9–4.9)
Alkaline Phosphatase: 85 [IU]/L (ref 44–121)
Bilirubin Total: <0.2 mg/dL (ref 0.0–1.2)
Bilirubin, Direct: <0.08 mg/dL (ref 0.00–0.40)
Total Protein: 6.5 g/dL (ref 6.0–8.5)

## 2023-12-23 NOTE — Telephone Encounter (Signed)
-----   Message from Shan Levans sent at 12/23/2023 10:54 AM EST ----- All labs normal

## 2023-12-23 NOTE — Progress Notes (Signed)
All labs normal

## 2023-12-23 NOTE — Telephone Encounter (Signed)
Pt was called and is aware of results, DOB was confirmed.  ?

## 2023-12-25 LAB — MICROALBUMIN / CREATININE URINE RATIO
Creatinine, Urine: 118.3 mg/dL
Microalb/Creat Ratio: 711 mg/g{creat} — ABNORMAL HIGH (ref 0–29)
Microalbumin, Urine: 840.8 ug/mL

## 2023-12-26 ENCOUNTER — Ambulatory Visit: Payer: Medicare Other | Admitting: Physical Medicine and Rehabilitation

## 2023-12-29 ENCOUNTER — Other Ambulatory Visit: Payer: Self-pay | Admitting: Licensed Clinical Social Worker

## 2023-12-29 NOTE — Patient Outreach (Addendum)
Medicaid Managed Care Social Work Note  12/29/2023 Name:  Sarah Long MRN:  [id number] DOB:  01-29-1971  Sarah Long is an 53 y.o. year old female who is a primary patient of Sarah Field Charlcie Cradle, MD.  The Medicaid Managed Care Coordination team was consulted for assistance with:  Mental Health Counseling and Resources Financial Difficulties related to clothing and utility  Sarah Long was given information about Medicaid Managed Care Coordination team services today. Sarah Long Patient agreed to services and verbal consent obtained.  Engaged with patient  for by telephone forinitial visit in response to referral for case management and/or care coordination services.   Patient is participating in a Managed Medicaid Plan:  Yes  Assessments/Interventions:  Review of past medical history, allergies, medications, health status, including review of consultants reports, laboratory and other test data, was performed as part of comprehensive evaluation and provision of chronic care management services.  SDOH: (Social Drivers of Health) assessments and interventions performed: SDOH Interventions    Flowsheet Row Patient Outreach Telephone from 12/29/2023 in [place] HEALTH POPULATION HEALTH DEPARTMENT Office Visit from 12/22/2023 in [facility] Health Comm Health [place] - A Dept Of [place] Telephone from 10/19/2023 in [facility] V[facility] Specialty Clinics Patient Outreach Telephone from 08/10/2023 in [place] POPULATION HEALTH DEPARTMENT Telephone from 07/12/2023 in [facility] Health Heart and Vascular Center Specialty Clinics Telephone from 07/11/2023 in [facility] Health Heart and Vascular Center Specialty Clinics  SDOH Interventions        Food Insecurity Interventions --  [Pt denied needing food resources, pt is on EBT] Intervention Not Indicated -- Intervention Not Indicated  [Patient utilizing farmers market and food pantrys] -- --  Housing Interventions Patient Declined  [Patient  is on Section 8 and declined needing housing resources but was previously homeless before receiving this voucher] AMB Referral -- Intervention Not Indicated -- --  Transportation Interventions Payor Benefit Intervention Not Indicated -- Payor Benefit -- --  Utilities Interventions Community Resources Provided  [Resources emailed] AMB Referral -- Intervention Not Indicated -- --  Surveyor, quantity Strain Interventions -- Other (Comment)  [amb referral] Other (Comment)  [LIE[employer] information and [employer]] -- Other (Comment)  Electrical engineer fund assistance] Other (Comment)  [wright patient care fund referral]  Physical Activity Interventions -- Intervention Not Indicated -- -- -- --  Stress Interventions Community Resources Provided, Provide Counseling Intervention Not Indicated -- -- -- --  Social Connections Interventions -- Intervention Not Indicated -- -- -- --  Health Literacy Interventions -- Intervention Not Indicated -- -- -- --       Advanced Directives Status:  Not addressed in this encounter.  Care Plan                 Allergies  Allergen Reactions   Lamictal [Lamotrigine] Itching   Strawberry Extract Hives   Sulfa Antibiotics Hives and Itching    Medications Reviewed Today   Medications were not reviewed in this encounter     Patient Active Problem List   Diagnosis Date Noted   Presence of heart assist device (HCC) 12/22/2023   Skin rash 06/28/2023   PTSD (post-traumatic stress disorder) 05/13/2023   Cardiac amyloidosis (HCC) 04/27/2023   Type 2 diabetes mellitus with microalbuminuria, with long-term current use of insulin (HCC) 12/15/2022   Peripheral vascular disease, unspecified (HCC) 03/22/2022   Type 2 diabetes mellitus with diabetic polyneuropathy, with long-term current use of insulin (HCC) 02/03/2022   Type 2 diabetes mellitus with hyperglycemia,  with long-term current use of insulin (HCC) 02/03/2022   Microalbuminuria 02/03/2022   Right great toe amputee (HCC)  11/03/2021   Class 2 severe obesity due to excess calories with serious comorbidity and body mass index (BMI) of 36.0 to 36.9 in adult [facility]) 10/15/2021   Insomnia 10/07/2021   Left below-knee amputee (HCC) 07/01/2021   Normocytic anemia 06/26/2021   Heart failure with mid-range ejection fraction (HCC) 06/26/2021   Diabetic nephropathy with proteinuria (HCC) 06/26/2021   Vaginal discharge 07/16/2020   Dental caries 04/01/2020   Cracked tooth 04/01/2020   Vitamin D deficiency 03/11/2020   Prolonged QT interval 02/26/2020   Chronic combined systolic and diastolic CHF (congestive heart failure) (HCC) 02/26/2020   Heart block AV complete (HCC) 02/11/2020   Exocrine pancreatic insufficiency    Dysmenorrhea 10/02/2019   DM type 2 with diabetic peripheral neuropathy (HCC) 05/30/2019   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 block) 05/29/2019   Former smoker 11/26/2013   Hypercholesteremia 02/02/2007   Bipolar 1 disorder, depressed (HCC) 02/02/2007   HYPERTENSION, BENIGN SYSTEMIC 02/02/2007   [facility] LCSW completed initial call with patient. She reports being established with long term behavioral health providers at [facility] already but is looking for children behavioral health resources as well. Extensive list of resources for (in home or at school therapy) sent to patient. Patient is also interested in utility and clothing resources. [facility] LCSW included these in the email as well. Patient reports not needing housing or food resources at this time. She denies any current crises or SI/HI. She is agreeable to contacting this [facility] LCSW direclty if she has any issues navigating these resources. However, patient feels confident that she will be able to utilize this information successfully once received. [facility] LCSW will sign off at this time as resource education and support have been provided.   Follow up:  Patient requests no follow-up at this time.  Plan: The Managed Medicaid care  management team is available to follow up with the patient after provider conversation with the patient regarding recommendation for care management engagement and subsequent re-referral to the care management team.   Dickie La, BSW, MSW, LCSW Licensed Clinical Social Worker [place]   [email or web address] Direct Dial: [phone]

## 2023-12-29 NOTE — Patient Instructions (Signed)
Visit Information  Sarah Long was given information about Medicaid Managed Care team care coordination services as a part of their [employer] Medicaid benefit. Sarah Long verbally consentedto engagement with the [facility] team.   If you are experiencing a medical emergency, please call 911 or report to your local emergency department or urgent care.   If you have a non-emergency medical problem during routine business hours, please contact your provider's office and ask to speak with a nurse.   For questions related to your [employer] [facility] health plan, please call: [phone]  OR visit the member homepage at: [email or web address]  If you would like to schedule transportation through your [facility] plan, please call the following number at least 2 days in advance of your appointment: [phone]  If you are experiencing a behavioral health crisis, call the [facility] Crisis Line at [phone]). The line is available 24 hours a day, seven days a week.  If you would like help to quit smoking, call 1-800-QUIT-NOW (202-672-5258) OR Espaol: 1-855-Djelo-Ya (1-601-093-2355) o para ms informacin haga clic aqu or Text READY to 732-202 to register via text     24- Hour Availability:    [facility]  [address] [place], [place] [phone] Crisis [phone]   [facility] the [facility] [phone]  [place] Crisis Service  [phone]    [facility]  [phone] (after hours)   Therapeutic Alternative/Mobile Crisis   [phone]   [place] National Suicide Hotline  361 273 0787 Sarah Long) [place] 778   Call [phone] for mental health emergencies   [facility]  [phone]);  [place] and [employer]  ([phone]); [place],  [place], [place], [place], [place], [place], [place]    [place] [facility] for [facility] Residents For 24/7 walk-up access to mental health services for [facility] children (4+), adolescents and adults, please visit the [facility] located at [address] in [place], [place].  *[facility] also provides comprehensive outpatient behavioral health services in a variety of locations around the [place].  Connect With [place] [address] [place], [place] [address] HelpLine: 906-070-4883 or [phone]  Get Directions  Find Help 24/7 By Phone Call our 24-hour HelpLine at [phone] or 8736007463 for immediate assistance for mental health and substance abuse issues.  Walk-In Help [place]: [facility] (Ages 4 and Up) [place]: Emergency Dept., [facility] Additional Resources National Hopeline Network: 1-800-SUICIDE The National Suicide Prevention Lifeline: 2-409-735-HGDJ     Dickie La, BSW, MSW, LCSW Licensed Clinical Social Worker [employer]   [facility] [email or web address] Direct Dial: [phone]

## 2023-12-30 ENCOUNTER — Telehealth: Payer: Self-pay

## 2023-12-30 NOTE — Telephone Encounter (Signed)
-----   Message from Shan Levans sent at 12/26/2023  1:39 PM EST ----- Let pt know urine still in high. Needs continued care of diabetes

## 2023-12-30 NOTE — Telephone Encounter (Signed)
Pt was called and is aware of results, DOB was confirmed.  ?

## 2024-01-03 ENCOUNTER — Encounter
Payer: Medicaid Other | Attending: Physical Medicine and Rehabilitation | Admitting: Physical Medicine and Rehabilitation

## 2024-01-03 VITALS — BP 127/83 | HR 85 | Ht 64.0 in | Wt 239.0 lb

## 2024-01-03 DIAGNOSIS — Z6841 Body Mass Index (BMI) 40.0 and over, adult: Secondary | ICD-10-CM | POA: Diagnosis not present

## 2024-01-03 DIAGNOSIS — E66813 Obesity, class 3: Secondary | ICD-10-CM | POA: Diagnosis not present

## 2024-01-03 DIAGNOSIS — N946 Dysmenorrhea, unspecified: Secondary | ICD-10-CM | POA: Diagnosis not present

## 2024-01-03 DIAGNOSIS — I10 Essential (primary) hypertension: Secondary | ICD-10-CM | POA: Insufficient documentation

## 2024-01-03 NOTE — Progress Notes (Signed)
Subjective:    Patient ID: Sarah Long, female    DOB: 08-19-1971, 53 y.o.   MRN: [id number]  HPI Sarah Long is a 53 year old woman who presents for f/u  left BKA phantom limb pain, menstrual pain.   1) Type 2 DM -she has an appointment with an endocrinologist -has a dexcom and omnipod to given her 2U insulin every hour -CBGs have been very well controlled.  -she has tingling and numbness in her right foot -she is getting phantom limb pain in left leg.  -she has follow-up with Dr. Lajoyce Corners  and PCP -she is willing to try nerve medications -she has neuropathic pain throughout her right lower extremity -does not require any refills today -she still has neuropathy in her other foot.   2) Constipation: -she does not lie the Miralax -she is worried about being constipated wit the iron.   3) Left BKA phantom limb pain -has started again -trialed Tens unit for her today -Qutenza was very helpful in the past and resulted I complete relief but it burned severely when she went home so she prefers another option -tolerating prosthesis well  4) Right lower extremity edema -compression garments ordered by Dr. Lajoyce Corners -she does not like how much the lasix makes her urinate -does not require any refills today -improved with lasix  5) Menarche -returned and can be painful -like clockwark the week before she started feeling pain week before  Sarah Long has been helpful  6) HTN -BP up to 151/84 today  7) Neuropathy in right foot  -she has pain there as well  8) Obesity: -she feels out of breath during sex and when walking and would like to lose weight  Pain Inventory Average Pain 7 Pain Right Now 0 My pain is sharp, stabbing, tingling, and aching  In the last 24 hours, has pain interfered with the following? General activity 5 Relation with others 7 Enjoyment of life 5 What TIME of day is your pain at its worst? Morning daytime evening night Sleep (in general) Fair  Pain is worse  with: sitting, inactivity, and some activites bending Pain improves with: medication Relief from Meds: 4     Family History  Problem Relation Age of Onset   Hypertension Mother    Diabetes Mother    Heart disease Mother    Hypertension Father    Diabetes Father    Heart disease Father    Depression Maternal Grandmother    Suicidality Maternal Grandmother    Stroke Maternal Grandfather    Schizophrenia Paternal Grandmother    Other Neg Hx    Breast cancer Neg Hx    Social History   Socioeconomic History   Marital status: Significant Other    Spouse name: Not on file   Number of children: Not on file   Years of education: Not on file   Highest education level: Some college, no degree  Occupational History   Not on file  Tobacco Use   Smoking status: Former    Current packs/day: 0.00    Average packs/day: 0.5 packs/day for 20.0 years (10.0 ttl pk-yrs)    Types: Cigarettes    Start date: 07/01/2001    Quit date: 07/01/2021    Years since quitting: 2.5   Smokeless tobacco: Never   Tobacco comments:    1. Centrix to stop smoking   Vaping Use   Vaping status: Never Used  Substance and Sexual Activity   Alcohol use: Not Currently  Comment: denies recently; history of excessive use   Drug use: No   Sexual activity: Yes    Partners: Female, Female    Birth control/protection: None  Other Topics Concern   Not on file  Social History Narrative   Not on file   Social Drivers of Health   Financial Resource Strain: High Risk (12/22/2023)   Overall Financial Resource Strain (CARDIA)    Difficulty of Paying Living Expenses: Hard  Food Insecurity: Food Insecurity Present (12/29/2023)   Hunger Vital Sign    Worried About Running Out of Food in the Last Year: Never true    Ran Out of Food in the Last Year: Sometimes true  Transportation Needs: No Transportation Needs (12/29/2023)   PRAPARE - Administrator, Civil Service (Medical): No    Lack of Transportation  (Non-Medical): No  Physical Activity: Insufficiently Active (12/22/2023)   Exercise Vital Sign    Days of Exercise per Week: 3 days    Minutes of Exercise per Session: 30 min  Stress: Stress Concern Present (12/29/2023)   [employer] of Occupational Health - Occupational Stress Questionnaire    Feeling of Stress : To some extent  Social Connections: Moderately Integrated (12/22/2023)   Social Connection and Isolation Panel [NHANES]    Frequency of Communication with Friends and Family: More than three times a week    Frequency of Social Gatherings with Friends and Family: More than three times a week    Attends Religious Services: More than 4 times per year    Active Member of Clubs or Organizations: Yes    Attends Banker Meetings: More than 4 times per year    Marital Status: Divorced   Past Surgical History:  Procedure Laterality Date   ABDOMINAL AORTOGRAM W/LOWER EXTREMITY N/A 06/17/2021   Procedure: ABDOMINAL AORTOGRAM W/LOWER EXTREMITY;  Surgeon: Iran Ouch, MD;  Location: [facility];  Service: Cardiovascular;  Laterality: N/A;   AMPUTATION Left 06/27/2021   Procedure: AMPUTATION BELOW KNEE;  Surgeon: Nadara Mustard, MD;  Location: [facility] OR;  Service: Orthopedics;  Laterality: Left;   AMPUTATION Right 09/04/2021   Procedure: RIGHT GREAT TOE AMPUTATION;  Surgeon: Nadara Mustard, MD;  Location: [facility] OR;  Service: Orthopedics;  Laterality: Right;   CESAREAN SECTION  1991   LEFT HEART CATH AND CORONARY ANGIOGRAPHY N/A 02/10/2020   Procedure: LEFT HEART CATH AND CORONARY ANGIOGRAPHY;  Surgeon: Lennette Bihari, MD;  Location: [facility];  Service: Cardiovascular;  Laterality: N/A;   PACEMAKER IMPLANT N/A 02/13/2020   Procedure: PACEMAKER IMPLANT;  Surgeon: Regan Lemming, MD;  Location: [facility];  Service: Cardiovascular;  Laterality: N/A;   TEMPORARY PACEMAKER N/A 02/10/2020   Procedure: TEMPORARY PACEMAKER;  Surgeon: Lennette Bihari, MD;   Location: [facility];  Service: Cardiovascular;  Laterality: N/A;   Past Medical History:  Diagnosis Date   Abnormal Pap smear 1998   Abnormal vaginal bleeding 12/19/2011   Acute urinary retention 06/26/2021   Arthritis    Bacterial infection    Bipolar 1 disorder (HCC)    Blister of second toe of left foot 11/21/2016   Candida vaginitis 07/2007   Depression    recently added wellbutrin-has not taken yet for bipolar   Diabetes in pregnancy    Diabetes mellitus    nph 20U qam and qpm, regular with meals   Diabetic ketoacidosis without coma associated with type 2 diabetes mellitus (HCC)  Fibroid    Galactorrhea of right breast 2008   GERD (gastroesophageal reflux disease)    H/O amenorrhea 07/2007   H/O dizziness 10/14/2011   H/O dysmenorrhea 2010   H/O menorrhagia 10/14/2011   H/O varicella    Headache(784.0)    Heavy vaginal bleeding due to contraceptive injection use 10/12/2011   Depo Provera   Herpes    Homelessness 11/19/2021   HSV-2 infection 01/03/2009   Hx: UTI (urinary tract infection) 2009   Hypertension    on aldomet   Hypoalbuminemia due to protein-calorie malnutrition (HCC) 06/26/2021   Increased BMI 2010   Irregular uterine bleeding 04/04/2012   Pt has mirena    Obesity 10/14/2011   Oligomenorrhea 07/2007   Osteomyelitis of great toe of right foot (HCC) 03/22/2022   Pelvic pain in female    Presence of permanent cardiac pacemaker    Preterm labor    PTSD (post-traumatic stress disorder)    Trichomonas    Yeast infection    BP 127/83   Pulse 85   Ht 5\' 4"  (1.626 m)   Wt 239 lb (108.4 kg)   SpO2 100%   BMI 41.02 kg/m   Opioid Risk Score:   Fall Risk Score:  `1  Depression screen [facility] 2/9     12/22/2023   11:34 AM 08/16/2023    8:55 AM 07/22/2023   10:09 AM 06/28/2023    9:22 AM 05/13/2023   12:54 PM 04/27/2023    8:51 AM 03/15/2023    9:30 AM  Depression screen PHQ 2/9  Decreased Interest 0 0 0 0  3 2  Down, Depressed, Hopeless 1 0 0  0  3 1  PHQ - 2 Score 1 0 0 0  6 3  Altered sleeping  0  3  3 3   Tired, decreased energy  2  3  3 1   Change in appetite  1  0  3 0  Feeling bad or failure about yourself   0  0  2 0  Trouble concentrating  0  1  2 0  Moving slowly or fidgety/restless  0  0  0 0  Suicidal thoughts  0  0  0 0  PHQ-9 Score  3  7  19 7   Difficult doing work/chores            Information is confidential and restricted. Go to Review Flowsheets to unlock data.      Review of Systems  Constitutional: Negative.   HENT: Negative.    Eyes: Negative.   Respiratory: Negative.    Cardiovascular: Negative.   Gastrointestinal: Negative.   Endocrine: Negative.   Genitourinary: Negative.   Musculoskeletal:        Pain in left leg  Skin: Negative.   Allergic/Immunologic: Negative.   Neurological:  Positive for weakness and numbness.  Hematological: Negative.   Psychiatric/Behavioral:  Positive for dysphoric mood. The patient is nervous/anxious.   All other systems reviewed and are negative.      Objective:   Physical Exam Gen: no distress, normal appearing, weight 218 lbs, BMI 37.42, BMI 106/68 HEENT: oral mucosa pink and moist, NCAT Cardio: Reg rate Chest: normal effort, normal rate of breathing Abd: soft, non-distended Ext: no edema Psych: pleasant, normal affect Skin: intact Musculoskeletal: Left BKA healed well, ambulating with prosthetic well. Amputated digits of right foot    Assessment & Plan:  Sarah Long is a 53 year old woman who presents for hospital follow-up after CIR admission for  left BKA  1) L BKA with phantom limb pain -continue follow-up with Dr. Lajoyce Corners, healing well D/c gabapentin  Prescribing Home Zynex NexWave Stimulator Device and supplies as needed. IFC, NMES and TENS medically necessary Treatment Rx: Daily @ 30-40 minutes per treatment PRN. Zynex NexWave only, no substitutions. Treatment Goals: 1) To reduce and/or eliminate pain 2) To improve functional capacity and  Activities of daily living 3) To reduce or prevent the need for oral medications 4) To improve circulation in the injured region 5) To decrease or prevent muscle spasm and muscle atrophy 6) To provide a self-management tool to the patient The patient has not sufficiently improved with conservative care. Numerous studies indexed by Medline and PubMed.gov have shown Neuromuscular, Interferential, and TENS stimulators to reduce pain, improve function, and reduce medication use in injured patients. Continued use of this evidence based, safe, drug free treatment is both reasonable and medically necessary at this time.   -Discussed Qutenza as an option for neuropathic pain control. Discussed that this is a capsaicin patch, stronger than capsaicin cream. Discussed that it is currently approved for diabetic peripheral neuropathy and post-herpetic neuralgia, but that it has also shown benefit in treating other forms of neuropathy. Provided patient with link to site to learn more about the patch: https://www.clark.biz/. Discussed that the patch would be placed in office and benefits usually last 3 months. Discussed that unintended exposure to capsaicin can cause severe irritation of eyes, mucous membranes, respiratory tract, and skin, but that Qutenza is a local treatment and does not have the systemic side effects of other nerve medications. Discussed that there may be pain, itching, erythema, and decreased sensory function associated with the application of Qutenza. Side effects usually subside within 1 week. A cold pack of analgesic medications can help with these side effects. Blood pressure can also be increased due to pain associated with administration of the patch.  -will complete social security forms, asked that these be resent -discussed her current challenges at work and that she is currently working three hours per day. Discussed her difficulty doing stairs, shortness of breath -Provided with a pain  relief journal and discussed that it contains foods and lifestyle tips to naturally help to improve pain. Discussed that these lifestyle strategies are also very good for health unlike some medications which can have negative side effects. Discussed that the act of keeping a journal can be therapeutic and helpful to realize patterns what helps to trigger and alleviate pain.   Prescribed Zynex Nexwave -discussed that she cannot take ibuprofen due to her heart disease -discussed that she can try increasing tylenol to 1,000mg  TID  2) Type 2 DM with peripheral neuropathy- improved -referred to PT for mirror therapy -improved, d/c qutenza.  -will look into medically tailored meals for her.  -Recommended 1 glass water with 1 TB apple cider vinegar before meals to reduce CBG spike, has additional health benefits, drink with straw to protect enamel.  -discussed her difficulty getting food from her family.  Discussed that honey has been shown to help diabetes, but that it does have fructose in it.    2) Anemia -discussed what it means to be anemic -discussed sources of iron rih fods.   3)Constipation:  -Provided list of following foods that help with constipation and highlighted a few: 1) prunes- contain high amounts of fiber.  2) apples- has a form of dietary fiber called pectin that accelerates stool movement and increases beneficial gut bacteria 3) pears- in addition to fiber,  also high in fructose and sorbitol which have laxative effect 4) figs- contain an enzyme ficin which helps to speed colonic transit 5) kiwis- contain an enzyme actinidin that improves gut motility and reduces constipation 6) oranges- rich in pectin (like apples) 7) grapefruits- contain a flavanol naringenin which has a laxative effect 8) vegetables- rich in fiber and also great sources of folate, vitamin C, and K 9) artichoke- high in inulin, prebiotic great for the microbiome 10) chicory- increases stool frequency and  softness (can be added to coffee) 11) rhubarb- laxative effect 12) sweet potato- high fiber 13) beans, peas, and lentils- contain both soluble and insoluble fiber 14) chia seeds- improves intestinal health and gut flora 15) flaxseeds- laxative effect 16) whole grain rye bread- high in fiber 17) oat bran- high in soluble and insoluble fiber 18) kefir- softens stools -recommended to try at least one of these foods every day.  -drink 6-8 glasses of water per day -walk regularly, especially after meals.   4) HTN: -discussed that BP is now well controlled with medication! -continue Coreg -Advised checking BP daily at home and logging results to bring into follow-up appointment with PCP and myself. -Reviewed BP meds today.  -Advised regarding healthy foods that can help lower blood pressure and provided with a list: 1) citrus foods- high in vitamins and minerals 2) salmon and other fatty fish - reduces inflammation and oxylipins 3) swiss chard (leafy green)- high level of nitrates 4) pumpkin seeds- one of the best natural sources of magnesium 5) Beans and lentils- high in fiber, magnesium, and potassium 6) Berries- high in flavonoids 7) Amaranth (whole grain, can be cooked similarly to rice and oats)- high in magnesium and fiber 8) Pistachios- even more effective at reducing BP than other nuts 9) Carrots- high in phenolic compounds that relax blood vessels and reduce inflammation 10) Celery- contain phthalides that relax tissues of arterial walls 11) Tomatoes- can also improve cholesterol and reduce risk of heart disease 12) Broccoli- good source of magnesium, calcium, and potassium 13) Greek yogurt: high in potassium and calcium 14) Herbs and spices: Celery seed, cilantro, Sarah Long, lemongrass, black cumin, ginseng, cinnamon, cardamom, sweet basil, and ginger 15) Chia and flax seeds- also help to lower cholesterol and blood sugar 16) Beets- high levels of nitrates that relax blood vessels   17) spinach and bananas- high in potassium  -Provided lise of supplements that can help with hypertension:  1) magnesium: one high quality brand is Bioptemizers since it contains all 7 types of magnesium, otherwise over the counter magnesium gluconate 400mg  is a good option 2) B vitamins 3) vitamin D 4) potassium 5) CoQ10 6) L-arginine 7) Vitamin C 8) Beetroot -Educated that goal BP is 120/80. -Made goal to incorporate some of the above foods into diet.    5) Allergies -recommended bee pollen   6) Obesity: -Educated that current weight is 215 lbs and current BMI is 36.90 -Educated regarding health benefits of weight loss- for pain, general health, chronic disease prevention, immune health, mental health.  -Will monitor weight every visit.  -Consider Roobois tea daily.  -Discussed the benefits of intermittent fasting. -Discussed foods that can assist in weight loss: 1) leafy greens- high in fiber and nutrients 2) dark chocolate- improves metabolism (if prefer sweetened, best to sweeten with honey instead of sugar).  3) cruciferous vegetables- high in fiber and protein 4) full fat yogurt: high in healthy fat, protein, calcium, and probiotics 5) apples- high in a variety of phytochemicals 6)  nuts- high in fiber and protein that increase feelings of fullness 7) grapefruit: rich in nutrients, antioxidants, and fiber (not to be taken with anticoagulation) 8) beans- high in protein and fiber 9) salmon- has high quality protein and healthy fats 10) green tea- rich in polyphenols 11) eggs- rich in choline and vitamin D 12) tuna- high protein, boosts metabolism 13) avocado- decreases visceral abdominal fat 14) chicken (pasture raised): high in protein and iron 15) blueberries- reduce abdominal fat and cholesterol 16) whole grains- decreases calories retained during digestion, speeds metabolism 17) chia seeds- curb appetite 18) chilies- increases fat metabolism  -Discussed  supplements that can be used:  1) Metatrim 400mg  BID 30 minutes before breakfast and dinner  2) Sphaeranthus indicus and Garcinia mangostana (combinations of these and #1 can be found in capsicum and zychrome  3) green coffee bean extract 400mg  twice per day or Irvingia (african mango) 150 to 300mg  twice per day.  7) Bilateral lower extremity edema 2/2 CHF: -continue Farxiga and Lasix -discussed that she has been cleared my Dr. Sherlie Ban to use Nexwave  8) Premenstrual pain: -discussed using Sarah Long daily, discussed that she can put a small thready in what she is eating every day -discussed that she is hopeful for menopause soon  9) Morbid obesity: -discussed that she eats a lot of fast food currently -discussed that she has a girlfriend that does charcuterie  -continue tylenol and ibuprofen prn -discussed that she eats what she wants to eat but watches her portions -discussed that she feels that she is out of breath during sex and when walking and she feels this may be related to her weight -Discussed the benefits of intermittent fasting. Recommended starting with pushing dinner 15 minutes earlier and when this feels easy, continuing to push dinner 15 minutes earlier. Discussed that this can help her body to improve its ability to burn fat rather than glucose, improving insulin sensitivity. Recommended drinking Roobois tea in the evening to help curb appetite and for its numerous health benefits. -discussed that she works 8pm to 8am -discussed topamax -discussed that she has a history of pancreatitis

## 2024-01-05 DIAGNOSIS — E7521 Fabry (-Anderson) disease: Secondary | ICD-10-CM | POA: Diagnosis not present

## 2024-01-06 ENCOUNTER — Emergency Department (HOSPITAL_COMMUNITY): Payer: Medicaid Other

## 2024-01-06 ENCOUNTER — Emergency Department (HOSPITAL_COMMUNITY)
Admission: EM | Admit: 2024-01-06 | Discharge: 2024-01-07 | Disposition: A | Discharge status: Home / Self Care | Payer: Medicaid Other | Attending: Emergency Medicine | Admitting: Emergency Medicine

## 2024-01-06 ENCOUNTER — Encounter (HOSPITAL_COMMUNITY): Payer: Self-pay | Admitting: *Deleted

## 2024-01-06 ENCOUNTER — Other Ambulatory Visit: Payer: Self-pay

## 2024-01-06 DIAGNOSIS — R0789 Other chest pain: Secondary | ICD-10-CM | POA: Diagnosis not present

## 2024-01-06 DIAGNOSIS — Z794 Long term (current) use of insulin: Secondary | ICD-10-CM | POA: Insufficient documentation

## 2024-01-06 DIAGNOSIS — E119 Type 2 diabetes mellitus without complications: Secondary | ICD-10-CM | POA: Diagnosis not present

## 2024-01-06 DIAGNOSIS — J9811 Atelectasis: Secondary | ICD-10-CM | POA: Diagnosis not present

## 2024-01-06 DIAGNOSIS — R0989 Other specified symptoms and signs involving the circulatory and respiratory systems: Secondary | ICD-10-CM | POA: Diagnosis not present

## 2024-01-06 DIAGNOSIS — R079 Chest pain, unspecified: Secondary | ICD-10-CM | POA: Diagnosis not present

## 2024-01-06 DIAGNOSIS — R059 Cough, unspecified: Secondary | ICD-10-CM | POA: Diagnosis not present

## 2024-01-06 DIAGNOSIS — R051 Acute cough: Secondary | ICD-10-CM

## 2024-01-06 DIAGNOSIS — I509 Heart failure, unspecified: Secondary | ICD-10-CM | POA: Diagnosis not present

## 2024-01-06 DIAGNOSIS — R911 Solitary pulmonary nodule: Secondary | ICD-10-CM | POA: Diagnosis not present

## 2024-01-06 DIAGNOSIS — R918 Other nonspecific abnormal finding of lung field: Secondary | ICD-10-CM | POA: Diagnosis not present

## 2024-01-06 DIAGNOSIS — Z20822 Contact with and (suspected) exposure to covid-19: Secondary | ICD-10-CM | POA: Insufficient documentation

## 2024-01-06 LAB — CBC
HCT: 26.1 % — ABNORMAL LOW (ref 36.0–46.0)
Hemoglobin: 7.9 g/dL — ABNORMAL LOW (ref 12.0–15.0)
MCH: 26.9 pg (ref 26.0–34.0)
MCHC: 30.3 g/dL (ref 30.0–36.0)
MCV: 88.8 fL (ref 80.0–100.0)
Platelets: 216 10*3/uL (ref 150–400)
RBC: 2.94 MIL/uL — ABNORMAL LOW (ref 3.87–5.11)
RDW: 17.7 % — ABNORMAL HIGH (ref 11.5–15.5)
WBC: 7.6 10*3/uL (ref 4.0–10.5)
nRBC: 0 % (ref 0.0–0.2)

## 2024-01-06 LAB — BASIC METABOLIC PANEL
Anion gap: 9 (ref 5–15)
BUN: 18 mg/dL (ref 6–20)
CO2: 20 mmol/L — ABNORMAL LOW (ref 22–32)
Calcium: 8.3 mg/dL — ABNORMAL LOW (ref 8.9–10.3)
Chloride: 107 mmol/L (ref 98–111)
Creatinine, Ser: 1.36 mg/dL — ABNORMAL HIGH (ref 0.44–1.00)
GFR, Estimated: 47 mL/min — ABNORMAL LOW (ref >60)
Glucose, Bld: 201 mg/dL — ABNORMAL HIGH (ref 70–99)
Potassium: 4.2 mmol/L (ref 3.5–5.1)
Sodium: 136 mmol/L (ref 135–145)

## 2024-01-06 LAB — HCG, QUANTITATIVE, PREGNANCY: hCG, Beta Chain, Quant, S: <1 m[IU]/mL (ref <5)

## 2024-01-06 LAB — RESP PANEL BY RT-PCR (RSV, FLU A&B, COVID)  RVPGX2
Influenza A by PCR: NEGATIVE
Influenza B by PCR: NEGATIVE
Resp Syncytial Virus by PCR: NEGATIVE
SARS Coronavirus 2 by RT PCR: NEGATIVE

## 2024-01-06 LAB — TROPONIN I (HIGH SENSITIVITY): Troponin I (High Sensitivity): 48 ng/L — ABNORMAL HIGH (ref <18)

## 2024-01-06 NOTE — ED Triage Notes (Signed)
The pt is c/o lt  chest pain with a cough and congestion for 3-4 hours

## 2024-01-06 NOTE — ED Provider Triage Note (Cosign Needed)
Emergency Medicine Provider Triage Evaluation Note  Sarah Long , a 53 y.o. female  was evaluated in triage.  Pt complains of chest pain.  Review of Systems  Positive:  Negative:   Physical Exam  BP (!) 153/75   Pulse 85   Temp 97.6 F (36.4 C)   Resp 20   Ht 5\' 4"  (1.626 m)   Wt 108.4 kg   LMP 11/16/2023   SpO2 100%   BMI 41.02 kg/m  Gen:   Awake, no distress   Resp:  Normal effort  MSK:   Moves extremities without difficulty  Other:    Medical Decision Making  Medically screening exam initiated at 9:19 PM.  Appropriate orders placed.  Sarah Long was informed that the remainder of the evaluation will be completed by another provider, this initial triage assessment does not replace that evaluation, and the importance of remaining in the ED until their evaluation is complete.  Cough and congestion starting 3 days ago. Chest pain started around 4 hours ago. 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   Denies fever, nausea, vomiting, diarrhea.   Dorthy Cooler, [place] 01/06/24 2121

## 2024-01-07 DIAGNOSIS — G546 Phantom limb syndrome with pain: Secondary | ICD-10-CM | POA: Diagnosis not present

## 2024-01-07 LAB — TROPONIN I (HIGH SENSITIVITY): Troponin I (High Sensitivity): 52 ng/L — ABNORMAL HIGH (ref <18)

## 2024-01-07 MED ORDER — DOXYCYCLINE HYCLATE 100 MG PO CAPS: 100.0000 mg | ORAL_CAPSULE | Freq: Two times a day (BID) | ORAL | 0 refills | Status: DC

## 2024-01-07 MED ORDER — DOXYCYCLINE HYCLATE 100 MG PO TABS
100.0000 mg | ORAL_TABLET | Freq: Once | ORAL | Status: AC
  Administered 2024-01-07: 100 mg via ORAL
  Filled 2024-01-07: qty 1

## 2024-01-07 NOTE — Discharge Instructions (Addendum)
Be sure to cut back on any tobacco use. You likely have an upper respiratory infection that will get better over the next week Follow-up with Dr. Delford Field in the next month as you will likely need an outpatient CAT scan of your chest

## 2024-01-07 NOTE — ED Provider Notes (Signed)
[place] [facility] AT [facility] Provider Note   CSN: [id number] Arrival date & time: 01/06/24  2051     History  Chief Complaint  Patient presents with   Chest Pain   Cough    Sarah Long is a 53 y.o. female.  The history is provided by the patient.  Patient with history of bipolar, chronic heart failure, diabetes presents with cough and chest pain. Patient reports around 3 days ago she started having increasing cough with yellow productive sputum.  No hemoptysis.  She has had mild shortness of breath. Earlier in the day she was coughing so hard she started having chest pain.  She is now feeling improved. Patient is an occasional smoker No new lower extremity edema.  She does report she needs multiple pillows at night but this is not a new phenomenon.  She does have mild dyspnea on exertion that is chronic     Past Medical History:  Diagnosis Date   Abnormal Pap smear 1998   Abnormal vaginal bleeding 12/19/2011   Acute urinary retention 06/26/2021   Arthritis    Bacterial infection    Bipolar 1 disorder (HCC)    Blister of second toe of left foot 11/21/2016   Candida vaginitis 07/2007   Depression    recently added wellbutrin-has not taken yet for bipolar   Diabetes in pregnancy    Diabetes mellitus    nph 20U qam and qpm, regular with meals   Diabetic ketoacidosis without coma associated with type 2 diabetes mellitus (HCC)    Fibroid    Galactorrhea of right breast 2008   GERD (gastroesophageal reflux disease)    H/O amenorrhea 07/2007   H/O dizziness 10/14/2011   H/O dysmenorrhea 2010   H/O menorrhagia 10/14/2011   H/O varicella    Headache(784.0)    Heavy vaginal bleeding due to contraceptive injection use 10/12/2011   Depo Provera   Herpes    Homelessness 11/19/2021   HSV-2 infection 01/03/2009   Hx: UTI (urinary tract infection) 2009   Hypertension    on aldomet   Hypoalbuminemia due to protein-calorie malnutrition (HCC)  06/26/2021   Increased BMI 2010   Irregular uterine bleeding 04/04/2012   Pt has mirena    Obesity 10/14/2011   Oligomenorrhea 07/2007   Osteomyelitis of great toe of right foot (HCC) 03/22/2022   Pelvic pain in female    Presence of permanent cardiac pacemaker    Preterm labor    PTSD (post-traumatic stress disorder)    Trichomonas    Yeast infection     Home Medications Prior to Admission medications   Medication Sig Start Date End Date Taking? Authorizing Provider  doxycycline (VIBRAMYCIN) 100 MG capsule Take 1 capsule (100 mg total) by mouth 2 (two) times daily. One po bid x 7 days 01/07/24  Yes Zadie Rhine, MD  ARIPiprazole (ABILIFY) 15 MG tablet Take 1 tablet (15 mg total) by mouth daily. 11/08/23   Bahraini, Sarah A  atorvastatin (LIPITOR) 20 MG tablet Take 1 tablet (20 mg total) by mouth daily. 12/22/23   [name], MD  CARESTART COVID-19 HOME TEST KIT See admin instructions. 08/15/23   [provider]  carvedilol (COREG) 12.5 MG tablet Take 1 tablet (12.5 mg total) by mouth 2 (two) times daily with a meal. 12/22/23   [name], MD  Continuous Glucose Sensor (DEXCOM G7 SENSOR) MISC 1 Device by Does not apply route as directed. 08/22/23   Shamleffer, Konrad Dolores, MD  dapagliflozin propanediol (FARXIGA) 10 MG TABS tablet Take 1 tablet (10 mg total) by mouth daily before breakfast. 08/16/23   [name], MD  fluticasone (FLONASE) 50 MCG/ACT nasal spray Place 1 spray into both nostrils daily. 11/11/23   Tomi Bamberger, PA-C  furosemide (LASIX) 20 MG tablet Take 3 tablets (60 mg total) by mouth daily. You may take 2 additional tablets as needed for swelling for a weight gain of 3 pounds or more in 24 hours or 5 pounds in 1 week. 10/17/23   Jacklynn Ganong, FNP  GALAFOLD 123 MG CAPS Take by mouth. 08/03/23   [provider]  hydrALAZINE (APRESOLINE) 50 MG tablet Take one three times daily 12/22/23   [name], MD  hydrOXYzine (ATARAX)  25 MG tablet Take 1 tablet (25 mg total) by mouth 2 (two) times daily as needed for anxiety (or sleep). 11/08/23 02/06/24  Bahraini, Sarah A  Insulin Disposable Pump (OMNIPOD 5 G7 INTRO, GEN 5,) KIT 1 Device by Does not apply route every other day. 08/22/23   Shamleffer, Konrad Dolores, MD  insulin glargine (LANTUS SOLOSTAR) 100 UNIT/ML Solostar Pen Inject 60 Units into the skin daily. 06/30/23   Shamleffer, Konrad Dolores, MD  insulin lispro (HUMALOG) 100 UNIT/ML KwikPen Inject 14 Units into the skin. Before meals daily    [provider]  Insulin Pen Needle 31G X 5 MM MISC Use as directed in the morning, at noon, in the evening, and at bedtime. 06/30/23   Shamleffer, Konrad Dolores, MD  isosorbide mononitrate (ISMO) 20 MG tablet Take 1 tablet (20 mg total) by mouth daily. 12/22/23   [name], MD  valsartan (DIOVAN) 320 MG tablet Take 1 tablet (320 mg total) by mouth daily. 12/22/23   [name], MD  varenicline (CHANTIX CONTINUING MONTH PAK) 1 MG tablet Take 1 tablet (1 mg total) by mouth 2 (two) times daily. 03/15/23   [name], MD  spironolactone (ALDACTONE) 25 MG tablet HOLD 04/27/23 04/27/23  [name], MD      Allergies    Lamictal [lamotrigine], Strawberry extract, and Sulfa antibiotics    Review of Systems   Review of Systems  Constitutional:  Negative for fever.  Respiratory:  Positive for cough and shortness of breath.   Cardiovascular:  Negative for leg swelling.  Gastrointestinal:  Negative for diarrhea and vomiting.    Physical Exam Updated Vital Signs BP 125/65 (BP Location: Right Arm)   Pulse 84   Temp 98.1 F (36.7 C) (Oral)   Resp 16   Ht 1.626 m (5\' 4" )   Wt 108.4 kg   LMP 11/16/2023   SpO2 96%   BMI 41.02 kg/m  Physical Exam CONSTITUTIONAL: Well developed/well nourished, no distress walking around the room HEAD: Normocephalic/atraumatic ENMT: Mucous membranes moist NECK: supple no meningeal signs, no JVD SPINE/BACK:entire  spine nontender CV: S1/S2 noted, no murmurs/rubs/gallops noted LUNGS: Lungs are clear to auscultation bilaterally, no apparent distress ABDOMEN: soft, nontender NEURO: Pt is awake/alert/appropriate, moves all extremitiesx4.  No facial droop.   EXTREMITIES: Left lower extremity prosthetic noted No significant edema noted to the right lower extremity SKIN: warm, color normal PSYCH: no abnormalities of mood noted, alert and oriented to situation  ED Results / Procedures / Treatments   Labs (all labs ordered are listed, but only abnormal results are displayed) Labs Reviewed  BASIC METABOLIC PANEL - Abnormal; Notable for the following components:      Result Value   CO2 20 (*)  Glucose, Bld 201 (*)    Creatinine, Ser 1.36 (*)    Calcium 8.3 (*)    GFR, Estimated 47 (*)    All other components within normal limits  CBC - Abnormal; Notable for the following components:   RBC 2.94 (*)    Hemoglobin 7.9 (*)    HCT 26.1 (*)    RDW 17.7 (*)    All other components within normal limits  TROPONIN I (HIGH SENSITIVITY) - Abnormal; Notable for the following components:   Troponin I (High Sensitivity) 48 (*)    All other components within normal limits  TROPONIN I (HIGH SENSITIVITY) - Abnormal; Notable for the following components:   Troponin I (High Sensitivity) 52 (*)    All other components within normal limits  RESP PANEL BY RT-PCR (RSV, FLU A&B, COVID)  RVPGX2  HCG, QUANTITATIVE, PREGNANCY    EKG EKG Interpretation Date/Time:  Friday January 06 2024 21:00:40 EST Ventricular Rate:  88 PR Interval:  198 QRS Duration:  150 QT Interval:  420 QTC Calculation: 508 R Axis:   193  Text Interpretation: Sinus rhythm with frequent ventricular-paced complexes Non-specific intra-ventricular conduction block Possible Right ventricular hypertrophy Inferior infarct , age undetermined Confirmed by Zadie Rhine ([id number]) on 01/07/2024 12:19:45 AM  Radiology DG Chest 2 View Result Date:  01/06/2024 CLINICAL DATA:  Chest pain EXAM: CHEST - 2 VIEW COMPARISON:  10/02/2023 FINDINGS: Left-sided pacing device as before. Cardiomegaly with mild central congestion. Left midlung opacity more ovoid in configuration compared to prior. No pneumothorax IMPRESSION: 1. Cardiomegaly with mild central congestion. 2. Left midlung opacity more ovoid in configuration compared to prior, probably still represents atelectasis but consider correlation with chest CT Electronically Signed   By: Jasmine Pang M.D.   On: 01/06/2024 21:56    Procedures Procedures    Medications Ordered in ED Medications  doxycycline (VIBRA-TABS) tablet 100 mg (100 mg Oral Given 01/07/24 0055)    ED Course/ Medical Decision Making/ A&P Clinical Course as of 01/07/24 0123  Sat Jan 07, 2024  0018 Hemoglobin(!): 7.9 Chronic anemia [DW]  0018 Glucose(!): 201 Hyperglycemia [DW]  0018 Creatinine(!): 1.36 Improved renal insufficiency [DW]  0039 Patient overall well-appearing.  She reports recent cough and yellow sputum production and now having pain with cough.  She is in no acute distress.  I suspect upper respiratory infection, but due to her smoking status and productive yellow sputum, will start her on antibiotics.  Viral panel negative.  She may also have some fluid retention, she will increase her Lasix.  Previous cardiac cath revealed normal coronaries, and troponins are chronically elevated, low suspicion for ACS or PE [DW]  0040 Patient will need outpatient follow-up for CT chest due to radiology recommendations, message been sent to her PCP [DW]  0122 Vitals are appropriate.  Patient be discharged home. [DW]    Clinical Course User Index [DW] Zadie Rhine, MD                                 Medical Decision Making Amount and/or Complexity of Data Reviewed Labs:  Decision-making details documented in ED Course.  Risk Prescription drug management.   This patient presents to the ED for concern of cough,  this involves an extensive number of treatment options, and is a complaint that carries with it a high risk of complications and morbidity.  The differential diagnosis includes but is not limited to viral URI, influenza,  COVID-19, pneumonia, CHF  Comorbidities that complicate the patient evaluation: Patient's presentation is complicated by their history of pacemaker placement, CHF  Social Determinants of Health: Patient's  tobacco use and difficulty affording medications   increases the complexity of managing their presentation  Additional history obtained: Records reviewed  outpatient records reviewed  Lab Tests: I Ordered, and personally interpreted labs.  The pertinent results include: Chronic anemia, improved renal insufficiency, chronically elevated troponin  Imaging Studies ordered: I ordered imaging studies including X-ray chest   I independently visualized and interpreted imaging which showed cardiomegaly,?Nodule I agree with the radiologist interpretation  Medicines ordered and prescription drug management: I ordered medication including doxycycline  for cough    Reevaluation: After the interventions noted above, I reevaluated the patient and found that they have :stayed the same  Complexity of problems addressed: Patient's presentation is most consistent with  acute presentation with potential threat to life or bodily function  Disposition: After consideration of the diagnostic results and the patient's response to treatment,  I feel that the patent would benefit from discharge   .           Final Clinical Impression(s) / ED Diagnoses Final diagnoses:  Acute cough  Pulmonary nodule    Rx / DC Orders ED Discharge Orders          Ordered    doxycycline (VIBRAMYCIN) 100 MG capsule  2 times daily        01/07/24 0030              Zadie Rhine, MD 01/07/24 [phone]

## 2024-01-07 NOTE — ED Notes (Signed)
 Patient discharged home. VSS. Patient ambulatory out of treatment area with clean and steady gait. All questions answered at time of discharge. Belongings sent home with patient.
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
## 2024-01-17 ENCOUNTER — Encounter (HOSPITAL_COMMUNITY): Payer: Self-pay | Admitting: Psychiatry

## 2024-01-17 ENCOUNTER — Telehealth (INDEPENDENT_AMBULATORY_CARE_PROVIDER_SITE_OTHER): Payer: Medicaid Other | Admitting: Psychiatry

## 2024-01-17 DIAGNOSIS — F319 Bipolar disorder, unspecified: Secondary | ICD-10-CM | POA: Diagnosis not present

## 2024-01-17 DIAGNOSIS — F4310 Post-traumatic stress disorder, unspecified: Secondary | ICD-10-CM | POA: Diagnosis not present

## 2024-01-17 DIAGNOSIS — F1090 Alcohol use, unspecified, uncomplicated: Secondary | ICD-10-CM

## 2024-01-17 MED ORDER — HYDROXYZINE HCL 25 MG PO TABS: 25.0000 mg | ORAL_TABLET | Freq: Every day | ORAL | 2 refills | Status: DC | PRN

## 2024-01-17 MED ORDER — ARIPIPRAZOLE 15 MG PO TABS: 15.0000 mg | ORAL_TABLET | Freq: Every day | ORAL | 2 refills | Status: DC

## 2024-01-17 NOTE — Patient Instructions (Signed)
Thank you for attending your appointment today.  -- We did not make any medication changes today. Please continue medications as prescribed.  Please do not make any changes to medications without first discussing with your provider. If you are experiencing a psychiatric emergency, please call 911 or present to your nearest emergency department. Additional crisis, medication management, and therapy resources are included below.  [facility]  [address] [place], [place] [address] [phone] WALK-IN URGENT CARE 24/7 FOR ANYONE [address] [place], [place]  [phone] Fax: [phone] [email or web address] *Interpreters available *Accepts all insurance and uninsured for Urgent Care needs *Accepts Medicaid and uninsured for outpatient treatment (below)      ONLY FOR [facility]  Below:    Outpatient New Patient Assessment/Therapy Walk-ins:        Monday, Wednesday, and Thursday 8am until slots are full (first come, first served)                   New Patient Psychiatry/Medication Management        Monday-Friday 8am-11am (first come, first served)               For all walk-ins we ask that you arrive by 7:15am, because patients will be seen in the order of arrival.

## 2024-01-23 NOTE — Progress Notes (Incomplete)
[name], MD @[facility] @ None  Chief Complaint:   HPI: Sarah Long is a 53 y.o. female with history of chronic Hfmr EF with recovered EF, DM II, HTN, HLD, CHB s/p PPM, tobacco use and prior ETOH abuse with hx ETOH pancreatitis, L BKA 07/22, CKD III.  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 Alpha galactosidase activity was low at 20.1. Seen by Dr. Jomarie Longs for genetic counseling, agreed with diagnosis of Fabry's disease.   Echo 02/24: EF 65-70%, severe LVH, RV okay, GLS -6.4% (very abnormal)  Saw Dr. Lilian Kapur 01/05/24 for video visit for management of Fabry disease. She is now on Galafold.  Here today for follow-up on her CHF.  ROS: All systems negative except as listed in HPI, PMH and Problem List.  SH:  Social History   Socioeconomic History   Marital status: Significant Other    Spouse name: Not on file   Number of children: Not on file   Years of education: Not on file   Highest education level: Some college, no degree  Occupational History   Not on file  Tobacco Use   Smoking status: Former    Current packs/day: 0.00    Average packs/day: 0.5 packs/day for 20.0 years (10.0 ttl pk-yrs)    Types: Cigarettes    Start date: 07/01/2001    Quit date: 07/01/2021    Years since quitting: 2.5   Smokeless tobacco: Never   Tobacco comments:    1. Centrix to stop smoking   Vaping Use   Vaping status: Never Used  Substance and Sexual Activity   Alcohol use: Not Currently    Comment: denies recently; history of excessive use   Drug use: No   Sexual activity: Yes    Partners: Female, Female    Birth control/protection: None  Other Topics Concern   Not on file  Social History Narrative   Not on file   Social Drivers of Health   Financial Resource Strain: High Risk (12/22/2023)   Overall Financial Resource Strain (CARDIA)    Difficulty of Paying Living Expenses: Hard  Food Insecurity: Food  Insecurity Present (12/29/2023)   Hunger Vital Sign    Worried About Running Out of Food in the Last Year: Never true    Ran Out of Food in the Last Year: Sometimes true  Transportation Needs: No Transportation Needs (12/29/2023)   PRAPARE - Administrator, Civil Service (Medical): No    Lack of Transportation (Non-Medical): No  Physical Activity: Insufficiently Active (12/22/2023)   Exercise Vital Sign    Days of Exercise per Week: 3 days    Minutes of Exercise per Session: 30 min  Stress: Stress Concern Present (12/29/2023)   [employer] of Occupational Health - Occupational Stress Questionnaire    Feeling of Stress : To some extent  Social Connections: Moderately Integrated (12/22/2023)   Social Connection and Isolation Panel [NHANES]    Frequency of Communication with Friends and Family: More than three times a week    Frequency of Social Gatherings with Friends and Family: More than three times a week    Attends Religious Services: More than 4 times per year    Active Member of [employer] or Organizations: Yes    Attends Banker Meetings: More than 4 times per year    Marital Status: Divorced  Intimate Partner Violence: Not At Risk (08/10/2023)   Humiliation, Afraid, Rape, and Kick  questionnaire    Fear of Current or Ex-Partner: No    Emotionally Abused: No    Physically Abused: No    Sexually Abused: No    FH:  Family History  Problem Relation Age of Onset   Hypertension Mother    Diabetes Mother    Heart disease Mother    Hypertension Father    Diabetes Father    Heart disease Father    Depression Maternal Grandmother    Suicidality Maternal Grandmother    Stroke Maternal Grandfather    Schizophrenia Paternal Grandmother    Other Neg Hx    Breast cancer Neg Hx     Past Medical History:  Diagnosis Date   Abnormal Pap smear 1998   Abnormal vaginal bleeding 12/19/2011   Acute urinary retention 06/26/2021   Arthritis    Bacterial infection     Bipolar 1 disorder (HCC)    Blister of second toe of left foot 11/21/2016   Candida vaginitis 07/2007   Depression    recently added wellbutrin-has not taken yet for bipolar   Diabetes in pregnancy    Diabetes mellitus    nph 20U qam and qpm, regular with meals   Diabetic ketoacidosis without coma associated with type 2 diabetes mellitus (HCC)    Fibroid    Galactorrhea of right breast 2008   GERD (gastroesophageal reflux disease)    H/O amenorrhea 07/2007   H/O dizziness 10/14/2011   H/O dysmenorrhea 2010   H/O menorrhagia 10/14/2011   H/O varicella    Headache(784.0)    Heavy vaginal bleeding due to contraceptive injection use 10/12/2011   Depo Provera   Herpes    Homelessness 11/19/2021   HSV-2 infection 01/03/2009   Hx: UTI (urinary tract infection) 2009   Hypertension    on aldomet   Hypoalbuminemia due to protein-calorie malnutrition (HCC) 06/26/2021   Increased BMI 2010   Irregular uterine bleeding 04/04/2012   Pt has mirena    Obesity 10/14/2011   Oligomenorrhea 07/2007   Osteomyelitis of great toe of right foot (HCC) 03/22/2022   Pelvic pain in female    Presence of permanent cardiac pacemaker    Preterm labor    PTSD (post-traumatic stress disorder)    Trichomonas    Yeast infection     Current Outpatient Medications  Medication Sig Dispense Refill   ARIPiprazole (ABILIFY) 15 MG tablet Take 1 tablet (15 mg total) by mouth daily. 30 tablet 2   atorvastatin (LIPITOR) 20 MG tablet Take 1 tablet (20 mg total) by mouth daily. 90 tablet 3   CARESTART COVID-19 HOME TEST KIT See admin instructions.     carvedilol (COREG) 12.5 MG tablet Take 1 tablet (12.5 mg total) by mouth 2 (two) times daily with a meal. 120 tablet 1   Continuous Glucose Sensor (DEXCOM G7 SENSOR) MISC 1 Device by Does not apply route as directed. 9 each 3   dapagliflozin propanediol (FARXIGA) 10 MG TABS tablet Take 1 tablet (10 mg total) by mouth daily before breakfast. 30 tablet 11    doxycycline (VIBRAMYCIN) 100 MG capsule Take 1 capsule (100 mg total) by mouth 2 (two) times daily. One po bid x 7 days 14 capsule 0   fluticasone (FLONASE) 50 MCG/ACT nasal spray Place 1 spray into both nostrils daily. 15.8 mL 0   furosemide (LASIX) 20 MG tablet Take 3 tablets (60 mg total) by mouth daily. You may take 2 additional tablets as needed for swelling for a weight gain of 3 pounds  or more in 24 hours or 5 pounds in 1 week. 120 tablet 11   GALAFOLD 123 MG CAPS Take by mouth.     hydrALAZINE (APRESOLINE) 50 MG tablet Take one three times daily 180 tablet 1   hydrOXYzine (ATARAX) 25 MG tablet Take 1 tablet (25 mg total) by mouth daily as needed for anxiety (or sleep). 30 tablet 2   Insulin Disposable Pump (OMNIPOD 5 G7 INTRO, GEN 5,) KIT 1 Device by Does not apply route every other day. 1 kit 0   insulin glargine (LANTUS SOLOSTAR) 100 UNIT/ML Solostar Pen Inject 60 Units into the skin daily. 60 mL 3   insulin lispro (HUMALOG) 100 UNIT/ML KwikPen Inject 14 Units into the skin. Before meals daily     Insulin Pen Needle 31G X 5 MM MISC Use as directed in the morning, at noon, in the evening, and at bedtime. 400 each 3   isosorbide mononitrate (ISMO) 20 MG tablet Take 1 tablet (20 mg total) by mouth daily. 90 tablet 3   valsartan (DIOVAN) 320 MG tablet Take 1 tablet (320 mg total) by mouth daily. 90 tablet 3   varenicline (CHANTIX CONTINUING MONTH PAK) 1 MG tablet Take 1 tablet (1 mg total) by mouth 2 (two) times daily. 60 tablet 1   No current facility-administered medications for this visit.    There were no vitals filed for this visit.  PHYSICAL EXAM:  General:  Well appearing. No resp difficulty HEENT: normal Neck: supple. JVP flat. Carotids 2+ bilaterally; no bruits. No lymphadenopathy or thryomegaly appreciated. Cor: PMI normal. Regular rate & rhythm. No rubs, gallops or murmurs. Lungs: clear Abdomen: soft, nontender, nondistended. No hepatosplenomegaly. No bruits or masses. Good  bowel sounds. Extremities: no cyanosis, clubbing, rash, edema Neuro: alert & orientedx3, cranial nerves grossly intact. Moves all 4 extremities w/o difficulty. Affect pleasant.   ECG:    ASSESSMENT & PLAN:   1. HFmrEF w/ recovered EF/nonischemic cardiomyopathy: Echo in 3/21 with EF 40-45%, severe LV hypertrophy.  This was found in association with complete heart block.  Cath in 3/21 showed no significant CAD.  PYP scan 6/21 not suggestive of TTR amyloidosis. cMRI 2/23: LVEF 68%, severe LVH, ECV 54%, no LVOT gradient, poor nulling with uninterpretable delayed enhancement. CTA chest did not show evidence for pulmonary sarcoidosis.  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  There is wide phenotypic variation in female Fabry's heterozygotes, but suspect that the cardiomyopathy with LVH and h/o complete heart block in this situation is most likely due to Fabry's disease. Echo 2/24 showed EF 65-70%, severe LVH, normal RV size and systolic function, GLS -6.4% (very abnormal) - Now sees Dr. Austin [facility] at [facility] for management of Fabry disease. Currently taking Galafold. Genetic testing for her children is pending.  - NYHA ***. Volume ***.  - Continue hydralazine 50 mg tid + isosorbide mononitrate 20 mg daily.  - Continue Farxiga 10 mg daily. No current GU symptoms. - Continue Coreg 12.5 mg bid. - Continue valsartan 320 mg daily.  - Off spiro d/t hyperkalemia 2. HTN:  *** 3. Hyperlipidemia: Continue atorvastatin. Good lipids 8/24 4. Smoking: Quit smoking x 2 months. Congratulated! - Continue Chantix. 5. Type 2 diabetes: Followed by Endocrinology. She is on insulin. - last A1c was 10.2% 6. Complete heart block: She has a [employer] Jude PPM and is RV pacing > 99% of the time.  This is likely  related to Fabry disease. - Overdue for follow-up with EP for her device 7. Obesity: Body mass index is 38.66 kg/m. -  Would avoid GLP-1 agonist with history of pancreatitis.

## 2024-01-25 ENCOUNTER — Ambulatory Visit (HOSPITAL_COMMUNITY): Payer: Medicaid Other | Admitting: Mental Health

## 2024-01-25 ENCOUNTER — Encounter (HOSPITAL_COMMUNITY): Payer: Self-pay

## 2024-01-25 NOTE — Progress Notes (Deleted)
   THERAPIST PROGRESS NOTE  Session Time: ***  Participation Level: {BHH PARTICIPATION LEVEL:22264}  Behavioral Response: {Appearance:22683}{BHH LEVEL OF CONSCIOUSNESS:22305}{BHH MOOD:22306}  Type of Therapy: {CHL AMB BH Type of Therapy:21022741}  Treatment Goals addressed: ***  ProgressTowards Goals: {Progress Towards Goals:21014066}  Interventions: {CHL AMB BH Type of Intervention:21022753}  Summary: Sarah Long is a 53 y.o. female who presents with ***.   Suicidal/Homicidal: {BHH YES OR NO:22294}{yes/no/with/without intent/plan:22693}  Therapist Response: ***  Plan: Return again in *** weeks.  Diagnosis: No diagnosis found.  Collaboration of Care: {BH OP Collaboration of Care:21014065}  Patient/Guardian was advised Release of Information must be obtained prior to any record release in order to collaborate their care with an outside provider. Patient/Guardian was advised if they have not already done so to contact the registration department to sign all necessary forms in order for [place] to release information regarding their care.   Consent: Patient/Guardian gives verbal consent for treatment and assignment of benefits for services provided during this visit. Patient/Guardian expressed understanding and agreed to proceed.   Stephan Minister [place], [facility] 01/25/2024

## 2024-01-26 ENCOUNTER — Encounter (HOSPITAL_COMMUNITY): Payer: Medicaid Other

## 2024-01-26 ENCOUNTER — Ambulatory Visit: Payer: Medicare Other | Admitting: Physician Assistant

## 2024-01-27 NOTE — Progress Notes (Signed)
 Cardiology Office Note Date:  01/27/2024  Patient ID:  Story, Conti [birth year], MRN [id number] PCP:  [name], MD  Cardiologist/Vascular: Dr. Kirke Corin Electrophysiologist: Dr. Elberta Fortis     Chief Complaint:   annual visit  History of Present Illness: Sarah Long is a 53 y.o. female with history of DM, HTN, s/p L BKA July 2022 2/2 non-healing wound toes w/OM, HLD, smoker, CHB w/PPM Hx of ETOH/pancreatitis, CKD (III)  She had a cath  March 2021 showed normal coronary arteries.  Struggled with non-healing wounds L toes, angiography was performed in July of this year which showed no significant peripheral arterial disease.  She had normal flow to the toes, though required left BKA 2/2 OM Has also had subsequent right great toe amputated  I have seen her a couple times in the past  2022: LVEF 70-75%, no WMA,  severe conc LVH (prior echo in 2021 was 40-45%) ?eval for amyloid  I have seen her over   Saw Dr. Elberta Fortis for EP last on 04/13/22 reported a myriad of symptoms, none that seemed cardiac causes Planned for HF referral  with some question of poss eval for amyloid, ? HCM  cMRI (2/23) with LVEF 68%, severe LVH, ECV 54%, no LVOT gradient, poor nulling with uninterpretable delayed enhancement.   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 Of note, "heart problems" run in family with mother and maternal grandparents having CHF. Alpha galactosidase activity was low at 20.1. She was seen for genetics counseling by Dr. 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   Echo 2/24 EF 65-70%, severe LVH, normal RV size and systolic function, GLS -6.4% (very abnormal), IVC normal.  Zio monitor in 3/24 showed 5 short SVT runs and rare PVCs.  PYP scan (6/21): Negative for TTR cardiac amyloidosis   She has been seen by Dr. Austin Miles in the Fabry's clinic at [facility] ---  working on starting her on migalastat, an alpha-galactosidase A Personnel officer. Her alpha-gal variant leads to abnormal folding/less stable enzyme amenable to treatment with migalastat which should stabilize with enzyme and direct it to lysosome.   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, has a Technical sales engineer, but unsure where she should keep it (work or home, or carry it back and forth)  Device information SJM dual chamber PPM implanted 02/13/2020   Past Medical History:  Diagnosis Date   Abnormal Pap smear 1998   Abnormal vaginal bleeding 12/19/2011   Acute urinary retention 06/26/2021   Arthritis    Bacterial infection    Bipolar 1 disorder (HCC)    Blister of second toe of left foot 11/21/2016   Candida vaginitis 07/2007   Depression    recently added wellbutrin-has not taken yet for bipolar   Diabetes in pregnancy    Diabetes mellitus    nph 20U qam and qpm, regular with meals  Diabetic ketoacidosis without coma associated with type 2 diabetes mellitus (HCC)    Fibroid    Galactorrhea of right breast 2008   GERD (gastroesophageal reflux disease)    H/O amenorrhea 07/2007   H/O dizziness 10/14/2011   H/O dysmenorrhea 2010   H/O menorrhagia 10/14/2011   H/O varicella    Headache(784.0)    Heavy vaginal bleeding due to contraceptive injection use 10/12/2011   Depo Provera   Herpes    Homelessness 11/19/2021   HSV-2 infection 01/03/2009   Hx: UTI (urinary tract infection) 2009   Hypertension    on aldomet   Hypoalbuminemia due  to protein-calorie malnutrition (HCC) 06/26/2021   Increased BMI 2010   Irregular uterine bleeding 04/04/2012   Pt has mirena    Obesity 10/14/2011   Oligomenorrhea 07/2007   Osteomyelitis of great toe of right foot (HCC) 03/22/2022   Pelvic pain in female    Presence of permanent cardiac pacemaker    Preterm labor    PTSD (post-traumatic stress disorder)    Trichomonas    Yeast infection     Past Surgical History:  Procedure Laterality Date   ABDOMINAL AORTOGRAM W/LOWER EXTREMITY N/A 06/17/2021   Procedure: ABDOMINAL AORTOGRAM W/LOWER EXTREMITY;  Surgeon: Iran Ouch, MD;  Location: [facility];  Service: Cardiovascular;  Laterality: N/A;   AMPUTATION Left 06/27/2021   Procedure: AMPUTATION BELOW KNEE;  Surgeon: Nadara Mustard, MD;  Location: [facility] OR;  Service: Orthopedics;  Laterality: Left;   AMPUTATION Right 09/04/2021   Procedure: RIGHT GREAT TOE AMPUTATION;  Surgeon: Nadara Mustard, MD;  Location: [facility] OR;  Service: Orthopedics;  Laterality: Right;   CESAREAN SECTION  1991   LEFT HEART CATH AND CORONARY ANGIOGRAPHY N/A 02/10/2020   Procedure: LEFT HEART CATH AND CORONARY ANGIOGRAPHY;  Surgeon: Lennette Bihari, MD;  Location: [facility];  Service: Cardiovascular;  Laterality: N/A;   PACEMAKER IMPLANT N/A 02/13/2020   Procedure: PACEMAKER IMPLANT;  Surgeon: Regan Lemming, MD;  Location: [facility];  Service: Cardiovascular;  Laterality: N/A;   TEMPORARY PACEMAKER N/A 02/10/2020   Procedure: TEMPORARY PACEMAKER;  Surgeon: Lennette Bihari, MD;  [facility] INVASIVE CV LAB;  Service: Cardiovascular;  Laterality: N/A;    Current Outpatient Medications  Medication Sig Dispense Refill   ARIPiprazole (ABILIFY) 15 MG tablet Take 1 tablet (15 mg total) by mouth daily. 30 tablet 2   atorvastatin (LIPITOR) 20 MG tablet Take 1 tablet (20 mg total) by mouth daily. 90 tablet 3   CARESTART COVID-19 HOME TEST KIT See admin instructions.     carvedilol (COREG) 12.5 MG  tablet Take 1 tablet (12.5 mg total) by mouth 2 (two) times daily with a meal. 120 tablet 1   Continuous Glucose Sensor (DEXCOM G7 SENSOR) MISC 1 Device by Does not apply route as directed. 9 each 3   dapagliflozin propanediol (FARXIGA) 10 MG TABS tablet Take 1 tablet (10 mg total) by mouth daily before breakfast. 30 tablet 11   doxycycline (VIBRAMYCIN) 100 MG capsule Take 1 capsule (100 mg total) by mouth 2 (two) times daily. One po bid x 7 days 14 capsule 0   fluticasone (FLONASE) 50 MCG/ACT nasal spray Place 1 spray into both nostrils daily. 15.8 mL 0   furosemide (LASIX) 20 MG tablet Take 3 tablets (60 mg total) by mouth daily. You may take 2 additional tablets as needed for swelling for a weight gain of 3 pounds or more in 24 hours or  5 pounds in 1 week. 120 tablet 11   GALAFOLD 123 MG CAPS Take by mouth.     hydrALAZINE (APRESOLINE) 50 MG tablet Take one three times daily 180 tablet 1   hydrOXYzine (ATARAX) 25 MG tablet Take 1 tablet (25 mg total) by mouth daily as needed for anxiety (or sleep). 30 tablet 2   Insulin Disposable Pump (OMNIPOD 5 G7 INTRO, GEN 5,) KIT 1 Device by Does not apply route every other day. 1 kit 0   insulin glargine (LANTUS SOLOSTAR) 100 UNIT/ML Solostar Pen Inject 60 Units into the skin daily. 60 mL 3   insulin lispro (HUMALOG) 100 UNIT/ML KwikPen Inject 14 Units into the skin. Before meals daily     Insulin Pen Needle 31G X 5 MM MISC Use as directed in the morning, at noon, in the evening, and at bedtime. 400 each 3   isosorbide mononitrate (ISMO) 20 MG tablet Take 1 tablet (20 mg total) by mouth daily. 90 tablet 3   valsartan (DIOVAN) 320 MG tablet Take 1 tablet (320 mg total) by mouth daily. 90 tablet 3   varenicline (CHANTIX CONTINUING MONTH PAK) 1 MG tablet Take 1 tablet (1 mg total) by mouth 2 (two) times daily. 60 tablet 1   No current facility-administered medications for this visit.    Allergies:   Lamictal [lamotrigine], Strawberry extract, and Sulfa  antibiotics   Social History:  The patient  reports that she quit smoking about 2 years ago. Her smoking use included cigarettes. She started smoking about 22 years ago. She has a 10 pack-year smoking history. She has never used smokeless tobacco. She reports that she does not currently use alcohol. She reports that she does not use drugs.   Family History:  The patient's family history includes Depression in her maternal grandmother; Diabetes in her father and mother; Heart disease in her father and mother; Hypertension in her father and mother; Schizophrenia in her paternal grandmother; Stroke in her maternal grandfather; Suicidality in her maternal grandmother.  ROS:  Please see the history of present illness.    All other systems are reviewed and otherwise negative.   PHYSICAL EXAM:  VS:  LMP 11/16/2023  BMI: There is no height or weight on file to calculate BMI. Well nourished, well developed, in no acute distress HEENT: normocephalic, atraumatic Neck: no JVD, carotid bruits or masses Cardiac: RRR; no significant murmurs, no rubs, or gallops Lungs: CTA b/l, no wheezing, rhonchi or rales Abd: soft, nontender MS: L BKA w/prosthetic Ext: no edema today Skin: warm and dry, no rash Neuro:  No gross deficits appreciated Psych: euthymic mood, full affect  PPM site is stable, no tethering or discomfort   EKG:  not done today  Device interrogation done today and reviewed by myself:  Battery and lead testing is good <1% AFib burden Longest episode 8 seconds, AFlutter back in may 2024 She is dependent today  01/31/23: TTE 1. Left ventricular ejection fraction, by estimation, is 65 to 70%. The  left ventricle has normal function. The left ventricle has no regional  wall motion abnormalities. There is severe concentric left ventricular  hypertrophy. Left ventricular diastolic   parameters are consistent with Grade II diastolic dysfunction  (pseudonormalization). The average left  ventricular global longitudinal  strain is -5.3 %. The global longitudinal strain is abnormal.   2. Right ventricular systolic function is normal. The right ventricular  size is normal. Tricuspid regurgitation signal is inadequate for assessing  PA pressure.   3.  The mitral valve is normal in structure. Trivial mitral valve  regurgitation. No evidence of mitral stenosis.   4. The aortic valve is tricuspid. Aortic valve regurgitation is not  visualized. No aortic stenosis is present.   5. The inferior vena cava is normal in size with greater than 50%  respiratory variability, suggesting right atrial pressure of 3 mmHg.   11/17/21: TTE IMPRESSIONS   1. Left ventricular ejection fraction, by estimation, is 70 to 75%. Left  ventricular ejection fraction by PLAX is 74 %. The left ventricle has  hyperdynamic function. The left ventricle has no regional wall motion  abnormalities. There is severe  concentric left ventricular hypertrophy. Left ventricular diastolic  function could not be evaluated. The average left ventricular global  longitudinal strain is 21.9 %. The global longitudinal strain is normal.   2. Right ventricular systolic function is normal. The right ventricular  size is normal. There is mildly elevated pulmonary artery systolic  pressure. The estimated right ventricular systolic pressure is 40.5 mmHg.   3. Left atrial size was severely dilated.   4. The pericardial effusion is circumferential.   5. The mitral valve is abnormal. Mild to moderate mitral valve  regurgitation.   6. The aortic valve is tricuspid. Aortic valve regurgitation is not  visualized. Aortic valve mean gradient measures 4.0 mmHg.   7. The inferior vena cava is normal in size with greater than 50%  respiratory variability, suggesting right atrial pressure of 3 mmHg.   Comparison(s): Changes from prior study are noted. 02/10/2020: LVEF 40-45%.   Conclusion(s)/Recommendation(s): As previously mentioned,  echo findings  suggestive of cardiac amyloidosis (LV wall thickness >1.9 cm, speckled,  trivial pericardial effusion) - consider PYP scan and/or cMRI.     02/10/2020: LHC (temp pacing wire plcmt) Diabetic ketoacidosis with complete heart block with ventricular escape rhythm. Successful insertion of a temporary venous pacemaker advanced to the RV apex with excellent capture.  Currently set at 80 bpm, and a 5. Normal coronary arteries. LVEDP 32 mmHg    02/10/2020: TTE IMPRESSIONS   1. Technically difficult; mild to moderate global reduction in LV  systolic function; severe LVH (myocardium with speckeld appearance;  suggest further evaluation for amyloid); mild MR.   2. Left ventricular ejection fraction, by estimation, is 40 to 45%. The  left ventricle has mildly decreased function. The left ventricle  demonstrates global hypokinesis. There is severe left ventricular  hypertrophy. Left ventricular diastolic parameters   are indeterminate.   3. Right ventricular systolic function is normal. The right ventricular  size is normal. There is normal pulmonary artery systolic pressure.   4. The mitral valve is normal in structure and function. Mild mitral  valve regurgitation. No evidence of mitral stenosis.   5. The aortic valve is tricuspid. Aortic valve regurgitation is not  visualized. No aortic stenosis is present.   6. The inferior vena cava is dilated in size with <50% respiratory  variability, suggesting right atrial pressure of 15 mmHg.   Recent Labs: 10/17/2023: B Natriuretic Peptide 463.6 12/22/2023: ALT 12 01/06/2024: BUN 18; Creatinine, Ser 1.36; Hemoglobin 7.9; Platelets 216; Potassium 4.2; Sodium 136  07/22/2023: VLDL 24 12/22/2023: Chol/HDL Ratio 3.4; Cholesterol, Total 154; HDL 45; LDL Chol Calc (NIH) 82; Triglycerides 156   CrCl cannot be calculated (Unknown ideal weight.).   Wt Readings from Last 3 Encounters:  01/06/24 238 lb 15.7 oz (108.4 kg)  01/03/24 239 lb (108.4  kg)  12/22/23 230 lb (104.3 kg)  Other studies reviewed: Additional studies/records reviewed today include: summarized above  ASSESSMENT AND PLAN:  PPM Intact function No programming changes made  Discussed best strategy with [employer] rep for monitoring Her device is not compatible for phone APP Recommends keeping her monitor at home > that eventually the device and transmitter will connect    HTN No changes   3.  Chronic diastolic CHF 4.  severe LV hypertrophy  5.  Fabry's disease  Follows closely with our AHF team has seen Dr. Austin Miles at [facility] for [facility] evaluation. They are working on starting her on migalastat, an alpha-galactosidase A pharmacological chaperone   DOE Exam does not suggest volume OL She uses extra lasix PRN She is anemia Does not appear completely new, she was aware, says she thinks someone is keeping track of that, has a lot of doctors, not sure which is managing her anemia  I have advised that she reach out to her PMD for her anemia and Dr. Wynonia Musty for her SOB Discussed anemia can make her SOB and needs follow up    6. SCAF <1%,  Follow burden   Disposition: remotes, back with EP again in clinic in a year, sooner if needed.  Current medicines are reviewed at length with the patient today.  The patient did not have any concerns regarding medicines.  Sarah Fredrickson, PA-C 01/27/2024 12:40 PM     [facility] [address] [place] [address] [phone] (office)  [phone] (fax)

## 2024-01-31 ENCOUNTER — Telehealth: Payer: Self-pay

## 2024-01-31 NOTE — Telephone Encounter (Signed)
 Dexcom needs PA

## 2024-02-01 ENCOUNTER — Ambulatory Visit: Payer: Medicaid Other | Attending: Physician Assistant | Admitting: Physician Assistant

## 2024-02-01 VITALS — BP 118/72 | HR 92 | Ht 64.0 in | Wt 230.0 lb

## 2024-02-01 DIAGNOSIS — Z950 Presence of cardiac pacemaker: Secondary | ICD-10-CM | POA: Diagnosis not present

## 2024-02-01 DIAGNOSIS — R0602 Shortness of breath: Secondary | ICD-10-CM

## 2024-02-01 DIAGNOSIS — I10 Essential (primary) hypertension: Secondary | ICD-10-CM

## 2024-02-01 DIAGNOSIS — D649 Anemia, unspecified: Secondary | ICD-10-CM

## 2024-02-01 DIAGNOSIS — I5032 Chronic diastolic (congestive) heart failure: Secondary | ICD-10-CM

## 2024-02-01 DIAGNOSIS — I517 Cardiomegaly: Secondary | ICD-10-CM

## 2024-02-01 LAB — CUP PACEART INCLINIC DEVICE CHECK
Battery Remaining Longevity: 69 mo
Battery Voltage: 2.99 V
Brady Statistic RA Percent Paced: 0.06 %
Brady Statistic RV Percent Paced: 99.76 %
Date Time Interrogation Session: 20250226172612
Implantable Lead Connection Status: 753985
Implantable Lead Connection Status: 753985
Implantable Lead Implant Date: 20210310
Implantable Lead Implant Date: 20210310
Implantable Lead Location: 753859
Implantable Lead Location: 753860
Implantable Pulse Generator Implant Date: 20210310
Lead Channel Impedance Value: 400 Ohm
Lead Channel Impedance Value: 475 Ohm
Lead Channel Pacing Threshold Amplitude: 0.625 V
Lead Channel Pacing Threshold Amplitude: 0.75 V
Lead Channel Pacing Threshold Pulse Width: 0.5 ms
Lead Channel Pacing Threshold Pulse Width: 0.5 ms
Lead Channel Sensing Intrinsic Amplitude: 12 mV
Lead Channel Sensing Intrinsic Amplitude: 5 mV
Lead Channel Setting Pacing Amplitude: 1 V
Lead Channel Setting Pacing Amplitude: 1.625
Lead Channel Setting Pacing Pulse Width: 0.5 ms
Lead Channel Setting Sensing Sensitivity: 2 mV
Pulse Gen Model: 2272
Pulse Gen Serial Number: 3802095

## 2024-02-01 NOTE — Patient Instructions (Addendum)
 Medication Instructions:   Your physician recommends that you continue on your current medications as directed. Please refer to the Current Medication list given to you today.   *If you need a refill on your cardiac medications before your next appointment, please call your pharmacy*   Lab Work:  NONE ORDERED  TODAY     If you have labs (blood work) drawn today and your tests are completely normal, you will receive your results only by: MyChart Message (if you have MyChart) OR A paper copy in the mail If you have any lab test that is abnormal or we need to change your treatment, we will call you to review the results.   Testing/Procedures: NONE ORDERED  TODAY     Follow-Up: At [facility], you and your health needs are our priority.  As part of our continuing mission to provide you with exceptional heart care, we have created designated Provider Care Teams.  These Care Teams include your primary Cardiologist (physician) and Advanced Practice Providers (APPs -  Physician Assistants and Nurse Practitioners) who all work together to provide you with the care you need, when you need it.  We recommend signing up for the patient portal called "MyChart".  Sign up information is provided on this After Visit Summary.  MyChart is used to connect with patients for Virtual Visits (Telemedicine).  Patients are able to view lab/test results, encounter notes, upcoming appointments, etc.  Non-urgent messages can be sent to your provider as well.   To learn more about what you can do with MyChart, go to [email or web address].    Your next appointment:   NEXT AVAILABLE WITH DR  MCLEAN/ APP  1 year(s)   Provider:    Loman Brooklyn, MD or Francis Dowse, PA-C       Other Instructions

## 2024-02-02 ENCOUNTER — Telehealth: Payer: Self-pay

## 2024-02-02 ENCOUNTER — Other Ambulatory Visit (HOSPITAL_COMMUNITY): Payer: Self-pay

## 2024-02-02 NOTE — Telephone Encounter (Signed)
 Pharmacy Patient Advocate Encounter   Received notification from Pt Calls Messages that prior authorization for Dexcom G7 sensor is required/requested.   Insurance verification completed.   The patient is insured through [facility] .   Per test claim: PA required; PA submitted to above mentioned insurance via CoverMyMeds Key/confirmation #/EOC [id number] Status is pending

## 2024-02-07 NOTE — Telephone Encounter (Signed)
 Pharmacy Patient Advocate Encounter  Received notification from [facility] that Prior Authorization for Dexcom G7 sensor has been APPROVED through 02/02/25   PA #/Case ID/Reference #: [id number]

## 2024-02-10 ENCOUNTER — Telehealth: Payer: Self-pay

## 2024-02-10 NOTE — Telephone Encounter (Signed)
 Pt monitor ordered 02/10/2024.

## 2024-02-10 NOTE — Telephone Encounter (Signed)
-----   Message from Sheilah Pigeon sent at 01/27/2024 12:58 PM EST ----- I see her Wed next week Looks like she lost her transmitter (by heart failure clinic notes), would you mind looking into that? Do we have any from them in the office? Or order her one?  THANK YOU renee

## 2024-02-16 ENCOUNTER — Encounter (HOSPITAL_COMMUNITY): Payer: Medicaid Other

## 2024-02-17 NOTE — Telephone Encounter (Signed)
 LMOVM for pt to give me a call back. I called to follow up to see if she received her new monitor.

## 2024-02-21 ENCOUNTER — Other Ambulatory Visit (HOSPITAL_COMMUNITY): Payer: Self-pay | Admitting: Psychiatry

## 2024-02-21 ENCOUNTER — Encounter: Payer: Self-pay | Admitting: Family Medicine

## 2024-02-21 ENCOUNTER — Ambulatory Visit: Payer: Medicaid Other | Attending: Family Medicine | Admitting: Family Medicine

## 2024-02-21 VITALS — BP 144/84 | HR 89 | Ht 64.0 in | Wt 241.2 lb

## 2024-02-21 DIAGNOSIS — Z794 Long term (current) use of insulin: Secondary | ICD-10-CM

## 2024-02-21 DIAGNOSIS — D509 Iron deficiency anemia, unspecified: Secondary | ICD-10-CM | POA: Diagnosis not present

## 2024-02-21 DIAGNOSIS — I129 Hypertensive chronic kidney disease with stage 1 through stage 4 chronic kidney disease, or unspecified chronic kidney disease: Secondary | ICD-10-CM | POA: Diagnosis not present

## 2024-02-21 DIAGNOSIS — R0600 Dyspnea, unspecified: Secondary | ICD-10-CM

## 2024-02-21 DIAGNOSIS — E1121 Type 2 diabetes mellitus with diabetic nephropathy: Secondary | ICD-10-CM | POA: Diagnosis not present

## 2024-02-21 DIAGNOSIS — E7800 Pure hypercholesterolemia, unspecified: Secondary | ICD-10-CM

## 2024-02-21 DIAGNOSIS — E7521 Fabry (-Anderson) disease: Secondary | ICD-10-CM | POA: Diagnosis not present

## 2024-02-21 DIAGNOSIS — I110 Hypertensive heart disease with heart failure: Secondary | ICD-10-CM

## 2024-02-21 DIAGNOSIS — N183 Chronic kidney disease, stage 3 unspecified: Secondary | ICD-10-CM

## 2024-02-21 DIAGNOSIS — I442 Atrioventricular block, complete: Secondary | ICD-10-CM

## 2024-02-21 DIAGNOSIS — F319 Bipolar disorder, unspecified: Secondary | ICD-10-CM | POA: Diagnosis not present

## 2024-02-21 DIAGNOSIS — E1142 Type 2 diabetes mellitus with diabetic polyneuropathy: Secondary | ICD-10-CM

## 2024-02-21 DIAGNOSIS — Z95811 Presence of heart assist device: Secondary | ICD-10-CM

## 2024-02-21 DIAGNOSIS — Z950 Presence of cardiac pacemaker: Secondary | ICD-10-CM | POA: Diagnosis not present

## 2024-02-21 DIAGNOSIS — R0609 Other forms of dyspnea: Secondary | ICD-10-CM | POA: Diagnosis not present

## 2024-02-21 DIAGNOSIS — N920 Excessive and frequent menstruation with regular cycle: Secondary | ICD-10-CM

## 2024-02-21 DIAGNOSIS — Z87891 Personal history of nicotine dependence: Secondary | ICD-10-CM

## 2024-02-21 MED ORDER — HYDROXYZINE HCL 25 MG PO TABS: 25.0000 mg | ORAL_TABLET | Freq: Every day | ORAL | 2 refills | Status: AC | PRN

## 2024-02-21 MED ORDER — ARIPIPRAZOLE 15 MG PO TABS: 15.0000 mg | ORAL_TABLET | Freq: Every day | ORAL | 2 refills | Status: AC

## 2024-02-21 MED ORDER — ALBUTEROL SULFATE HFA 108 (90 BASE) MCG/ACT IN AERS: 2.0000 | INHALATION_SPRAY | Freq: Four times a day (QID) | RESPIRATORY_TRACT | 2 refills | Status: DC | PRN

## 2024-02-21 NOTE — Patient Instructions (Addendum)
 VISIT SUMMARY:  Sarah Long, during your visit today, we discussed your worsening shortness of breath, swelling in your right ankle, and medication management for your anxiety and sleep issues. We also reviewed your chronic conditions including type 2 diabetes, hypertension, and Fabry's disease. We have outlined a plan to address each of these concerns and ensure you receive the necessary follow-up care.  YOUR PLAN:  -DYSPNEA: Dyspnea means difficulty breathing. Your shortness of breath, especially during activity and at night, may be due to heart or lung issues. We will conduct a pulmonary function test to help diagnose the cause and have prescribed an inhaler for you to use as needed. It is important that you stop smoking completely. We will review the test results in a follow-up visit.  -TYPE 2 DIABETES MELLITUS: Type 2 diabetes is a condition where your blood sugar levels are too high. Your recent A1c level was 10.2, which indicates poor blood sugar control. You should continue to follow up with Dr. Carmela Hurt for diabetes management and ensure regular monitoring of your A1c levels.  -HYPERTENSION: Hypertension means high blood pressure. Your blood pressure may be elevated due to missed medication doses and stress. It is important to take your blood pressure medications as prescribed to manage this condition.  -BIPOLAR DISORDER: Bipolar disorder is a mental health condition that affects your mood. You are currently out of your medications, Abilify and hydroxyzine, which may be causing your symptoms. We have sent a message to behavioral health for a medication refill and recommend visiting the behavioral health walk-in clinic for an immediate refill.  -FABRY'S DISEASE: Fabry's disease is a genetic condition that can affect various organs, including the heart. Your pacemaker is likely related to this condition. We will continue to monitor your heart health with your cardiologist.  -CHRONIC KIDNEY  DISEASE STAGE 3: Chronic kidney disease stage 3 means your kidneys are moderately damaged and not working as well as they should.  -GENERAL HEALTH MAINTENANCE: Routine health maintenance is important for overall well-being. You have not yet completed your colon cancer screening with Cologuard. Please complete this test as soon as possible.  INSTRUCTIONS:  Please schedule a follow-up appointment in three months. We will also follow up with the results of your pulmonary function test. Ensure you communicate with behavioral health for medication management.

## 2024-02-21 NOTE — Addendum Note (Signed)
 Addended by: Hoy Register on: 02/21/2024 02:45 PM   Modules accepted: Orders

## 2024-02-21 NOTE — Progress Notes (Signed)
 Subjective:  Patient ID: Sarah Long, female    DOB: 1971/04/11  Age: 53 y.o. MRN: [id number]  CC: Hospitalization Follow-up (SOB when walking/Medication refills)     Discussed the use of AI scribe software for clinical note transcription with the patient, who gave verbal consent to proceed.  History of Present Illness Sarah Long, a 53 year old with a complex medical history including type two diabetes mellitus, hypertension, left below knee amputation due to chronic toe wounds with osteomyelitis, complete heart block with pacemaker placement, stage three chronic kidney disease, hyperlipidemia, bipolar disorder and Fabry's disease, presents with shortness of breath that has been worsening over the past four months.  The shortness of breath is particularly noticeable when walking and has been causing her to wake up at night. To alleviate this, she has been using four pillows to prop herself up. She also reports swelling in her right ankle, which she manages by adjusting the dosage of her Lasix medication but she is currently on 60 mg. She has a history of smoking, but quit many years ago, although she admits to occasionally having a cigarette.   She is currently out of her medications Abilify and hydroxyzine for bipolar disorder and insomnia which are being prescribed by her psychiatrist but she has been unable to reach her to obtain refills.  She has thus been unable to sleep.  Last visit with behavioral health was 1 month ago.  Her diabetes is uncontrolled with A1c of 10.2 and she is currently under endocrinology care.  She has an upcoming appointment. Blood pressure is elevated and she is yet to take her antihypertensive as she was rushing from work to get here today.  Last visit with cardiology was last month    Past Medical History:  Diagnosis Date   Abnormal Pap smear 1998   Abnormal vaginal bleeding 12/19/2011   Acute urinary retention 06/26/2021   Arthritis    Bacterial  infection    Bipolar 1 disorder (HCC)    Blister of second toe of left foot 11/21/2016   Candida vaginitis 07/2007   Depression    recently added wellbutrin-has not taken yet for bipolar   Diabetes in pregnancy    Diabetes mellitus    nph 20U qam and qpm, regular with meals   Diabetic ketoacidosis without coma associated with type 2 diabetes mellitus (HCC)    Fibroid    Galactorrhea of right breast 2008   GERD (gastroesophageal reflux disease)    H/O amenorrhea 07/2007   H/O dizziness 10/14/2011   H/O dysmenorrhea 2010   H/O menorrhagia 10/14/2011   H/O varicella    Headache(784.0)    Heavy vaginal bleeding due to contraceptive injection use 10/12/2011   Depo Provera   Herpes    Homelessness 11/19/2021   HSV-2 infection 01/03/2009   Hx: UTI (urinary tract infection) 2009   Hypertension    on aldomet   Hypoalbuminemia due to protein-calorie malnutrition (HCC) 06/26/2021   Increased BMI 2010   Irregular uterine bleeding 04/04/2012   Pt has mirena    Obesity 10/14/2011   Oligomenorrhea 07/2007   Osteomyelitis of great toe of right foot (HCC) 03/22/2022   Pelvic pain in female    Presence of permanent cardiac pacemaker    Preterm labor    PTSD (post-traumatic stress disorder)    Trichomonas    Yeast infection     Past Surgical History:  Procedure Laterality Date   ABDOMINAL AORTOGRAM W/LOWER EXTREMITY N/A 06/17/2021   Procedure: ABDOMINAL AORTOGRAM  W/LOWER EXTREMITY;  Surgeon: Iran Ouch, MD;  Location: [facility];  Service: Cardiovascular;  Laterality: N/A;   AMPUTATION Left 06/27/2021   Procedure: AMPUTATION BELOW KNEE;  Surgeon: Nadara Mustard, MD;  Location: [facility];  Service: Orthopedics;  Laterality: Left;   AMPUTATION Right 09/04/2021   Procedure: RIGHT GREAT TOE AMPUTATION;  Surgeon: Nadara Mustard, MD;  Location: [facility] OR;  Service: Orthopedics;  Laterality: Right;   CESAREAN SECTION  1991   LEFT HEART CATH AND CORONARY ANGIOGRAPHY N/A 02/10/2020    Procedure: LEFT HEART CATH AND CORONARY ANGIOGRAPHY;  Surgeon: Lennette Bihari, MD;  Location: [facility];  Service: Cardiovascular;  Laterality: N/A;   PACEMAKER IMPLANT N/A 02/13/2020   Procedure: PACEMAKER IMPLANT;  Surgeon: Regan Lemming, MD;  Location: [facility];  Service: Cardiovascular;  Laterality: N/A;   TEMPORARY PACEMAKER N/A 02/10/2020   Procedure: TEMPORARY PACEMAKER;  Surgeon: Lennette Bihari, MD;  Location: [facility];  Service: Cardiovascular;  Laterality: N/A;    Family History  Problem Relation Age of Onset   Hypertension Mother    Diabetes Mother    Heart disease Mother    Hypertension Father    Diabetes Father    Heart disease Father    Depression Maternal Grandmother    Suicidality Maternal Grandmother    Stroke Maternal Grandfather    Schizophrenia Paternal Grandmother    Other Neg Hx    Breast cancer Neg Hx     Social History   Socioeconomic History   Marital status: Significant Other    Spouse name: Not on file   Number of children: Not on file   Years of education: Not on file   Highest education level: Some college, no degree  Occupational History   Not on file  Tobacco Use   Smoking status: Former    Current packs/day: 0.00    Average packs/day: 0.5 packs/day for 20.0 years (10.0 ttl pk-yrs)    Types: Cigarettes    Start date: 07/01/2001    Quit date: 07/01/2021    Years since quitting: 2.6   Smokeless tobacco: Never   Tobacco comments:    1. Centrix to stop smoking   Vaping Use   Vaping status: Never Used  Substance and Sexual Activity   Alcohol use: Not Currently    Comment: denies recently; history of excessive use   Drug use: No   Sexual activity: Yes    Partners: Female, Female    Birth control/protection: None  Other Topics Concern   Not on file  Social History Narrative   Not on file   Social Drivers of Health   Financial Resource Strain: High Risk (12/22/2023)   Overall Financial Resource Strain  (CARDIA)    Difficulty of Paying Living Expenses: Hard  Food Insecurity: Food Insecurity Present (12/29/2023)   Hunger Vital Sign    Worried About Running Out of Food in the Last Year: Never true    Ran Out of Food in the Last Year: Sometimes true  Transportation Needs: No Transportation Needs (12/29/2023)   PRAPARE - Administrator, Civil Service (Medical): No    Lack of Transportation (Non-Medical): No  Physical Activity: Insufficiently Active (12/22/2023)   Exercise Vital Sign    Days of Exercise per Week: 3 days    Minutes of Exercise per Session: 30 min  Stress: Stress Concern Present (12/29/2023)   [employer] of Occupational Health - Occupational Stress Questionnaire  Feeling of Stress : To some extent  Social Connections: Moderately Integrated (12/22/2023)   Social Connection and Isolation Panel [NHANES]    Frequency of Communication with Friends and Family: More than three times a week    Frequency of Social Gatherings with Friends and Family: More than three times a week    Attends Religious Services: More than 4 times per year    Active Member of [employer] or Organizations: Yes    Attends Engineer, structural: More than 4 times per year    Marital Status: Divorced    Allergies  Allergen Reactions   Lamictal [Lamotrigine] Itching   Strawberry Extract Hives   Sulfa Antibiotics Hives and Itching    Outpatient Medications Prior to Visit  Medication Sig Dispense Refill   atorvastatin (LIPITOR) 20 MG tablet Take 1 tablet (20 mg total) by mouth daily. 90 tablet 3   CARESTART COVID-19 HOME TEST KIT See admin instructions.     carvedilol (COREG) 12.5 MG tablet Take 1 tablet (12.5 mg total) by mouth 2 (two) times daily with a meal. 120 tablet 1   Continuous Glucose Sensor (DEXCOM G7 SENSOR) MISC 1 Device by Does not apply route as directed. 9 each 3   dapagliflozin propanediol (FARXIGA) 10 MG TABS tablet Take 1 tablet (10 mg total) by mouth daily before  breakfast. 30 tablet 11   furosemide (LASIX) 20 MG tablet Take 3 tablets (60 mg total) by mouth daily. You may take 2 additional tablets as needed for swelling for a weight gain of 3 pounds or more in 24 hours or 5 pounds in 1 week. 120 tablet 11   GALAFOLD 123 MG CAPS Take by mouth.     hydrALAZINE (APRESOLINE) 50 MG tablet Take one three times daily 180 tablet 1   Insulin Disposable Pump (OMNIPOD 5 G7 INTRO, GEN 5,) KIT 1 Device by Does not apply route every other day. 1 kit 0   insulin glargine (LANTUS SOLOSTAR) 100 UNIT/ML Solostar Pen Inject 60 Units into the skin daily. 60 mL 3   insulin lispro (HUMALOG) 100 UNIT/ML KwikPen Inject 14 Units into the skin. Before meals daily     Insulin Pen Needle 31G X 5 MM MISC Use as directed in the morning, at noon, in the evening, and at bedtime. 400 each 3   isosorbide mononitrate (ISMO) 20 MG tablet Take 1 tablet (20 mg total) by mouth daily. 90 tablet 3   valsartan (DIOVAN) 320 MG tablet Take 1 tablet (320 mg total) by mouth daily. 90 tablet 3   varenicline (CHANTIX CONTINUING MONTH PAK) 1 MG tablet Take 1 tablet (1 mg total) by mouth 2 (two) times daily. 60 tablet 1   ARIPiprazole (ABILIFY) 15 MG tablet Take 1 tablet (15 mg total) by mouth daily. 30 tablet 2   hydrOXYzine (ATARAX) 25 MG tablet Take 1 tablet (25 mg total) by mouth daily as needed for anxiety (or sleep). 30 tablet 2   No facility-administered medications prior to visit.     ROS Review of Systems  Objective:  BP (!) 144/84   Pulse 89   Ht 5\' 4"  (1.626 m)   Wt 241 lb 3.2 oz (109.4 kg)   SpO2 100%   BMI 41.40 kg/m      02/21/2024    2:16 PM 02/21/2024    1:47 PM 02/01/2024    9:29 AM  BP/Weight  Systolic BP 144 154 118  Diastolic BP 84 82 72  Wt. (Lbs)  241.2 230  BMI  41.4 kg/m2 39.48 kg/m2    Wt Readings from Last 3 Encounters:  02/21/24 241 lb 3.2 oz (109.4 kg)  02/01/24 230 lb (104.3 kg)  01/06/24 238 lb 15.7 oz (108.4 kg)     Physical Exam Constitutional:       Appearance: She is well-developed.  Cardiovascular:     Rate and Rhythm: Normal rate.     Heart sounds: Normal heart sounds. No murmur heard. Pulmonary:     Effort: Pulmonary effort is normal.     Breath sounds: Normal breath sounds. No wheezing or rales.  Chest:     Chest wall: No tenderness.  Abdominal:     General: Bowel sounds are normal. There is no distension.     Palpations: Abdomen is soft. There is no mass.     Tenderness: There is no abdominal tenderness.  Musculoskeletal:        General: Normal range of motion.     Right lower leg: No edema.     Comments: Left BKA  Neurological:     Mental Status: She is alert and oriented to person, place, and time.  Psychiatric:        Mood and Affect: Mood normal.        Latest Ref Rng & Units 01/06/2024    9:37 PM 12/22/2023   11:29 AM 11/01/2023    9:36 AM  CMP  Glucose 70 - 99 mg/dL 098   119   BUN 6 - 20 mg/dL 18   38   Creatinine 1.47 - 1.00 mg/dL 8.29   5.62   Sodium 130 - 145 mmol/L 136   137   Potassium 3.5 - 5.1 mmol/L 4.2   4.9   Chloride 98 - 111 mmol/L 107   109   CO2 22 - 32 mmol/L 20   19   Calcium 8.9 - 10.3 mg/dL 8.3   8.7   Total Protein 6.0 - 8.5 g/dL  6.5    Total Bilirubin 0.0 - 1.2 mg/dL  <8.6    Alkaline Phos 44 - 121 IU/L  85    AST 0 - 40 IU/L  14    ALT 0 - 32 IU/L  12      Lipid Panel     Component Value Date/Time   CHOL 154 12/22/2023 1129   TRIG 156 (H) 12/22/2023 1129   HDL 45 12/22/2023 1129   CHOLHDL 3.4 12/22/2023 1129   CHOLHDL 2.6 07/22/2023 0938   VLDL 24 07/22/2023 0938   LDLCALC 82 12/22/2023 1129    CBC    Component Value Date/Time   WBC 7.6 01/06/2024 2137   RBC 2.94 (L) 01/06/2024 2137   HGB 7.9 (L) 01/06/2024 2137   HGB 8.3 (L) 07/30/2021 1037   HCT 26.1 (L) 01/06/2024 2137   HCT 26.4 (L) 07/30/2021 1037   PLT 216 01/06/2024 2137   PLT 342 07/30/2021 1037   MCV 88.8 01/06/2024 2137   MCV 85 07/30/2021 1037   MCH 26.9 01/06/2024 2137   MCHC 30.3 01/06/2024  2137   RDW 17.7 (H) 01/06/2024 2137   RDW 15.9 (H) 07/30/2021 1037   LYMPHSABS 1.9 07/21/2022 0333   LYMPHSABS 1.6 07/30/2021 1037   MONOABS 0.4 07/21/2022 0333   EOSABS 0.2 07/21/2022 0333   EOSABS 0.1 07/30/2021 1037   BASOSABS 0.0 07/21/2022 0333   BASOSABS 0.0 07/30/2021 1037    Lab Results  Component Value Date   HGBA1C 10.2 (A) 11/25/2023  Assessment & Plan Dyspnea Chronic dyspnea with exertional and nocturnal exacerbations. Differential includes cardiac etiology versus COPD. Smoking history increases COPD risk. Pulmonary function test needed for diagnosis and treatment guidance. - Order pulmonary function test. - Prescribe inhaler for as-needed use. - Advise complete smoking cessation. - Follow up with pulmonary function test results. -Will also check for anemia as last hemoglobin was 7.9 last month -Will also send of BMP due to weight gain of 9 pounds in the last 3 weeks.  Type 2 Diabetes Mellitus Type 2 diabetes with recent A1c of 10.2. Management by endocrinologist. Emphasized A1c control due to cardiac risk. - Follow up with Dr. Lonzo Cloud for diabetes management. - Ensure regular A1c monitoring.  Hypertensive heart disease EF 65 to 70% She complains of dyspnea and discussed this at her last cardiology visit  -She has gained 9 pounds in 6 weeks; will add on BNP -Continue Lasix as prescribed Elevated blood pressure likely due to missed medication and stress. Managed with medication. - Ensure adherence to antihypertensive medication regimen. -Counseled on blood pressure goal of less than 130/80, low-sodium, DASH diet, medication compliance, 150 minutes of moderate intensity exercise per week. Discussed medication compliance, adverse effects.   Bipolar Disorder Medication management issues with Abilify and hydroxyzine. Symptoms likely due to lack of medication. - Send message to behavioral health for medication refill. -Received response from psychiatrist  that refill has been sent to pharmacy.  Fabry's Disease Genetic condition with family history.  -S/p Pacemaker due to complete heart block possibly related to Fabry's disease. Family planning discussed. - Monitor cardiac status with cardiology.  Chronic Kidney Disease Stage 3 -Combination of hypertensive and diabetic nephropathy Avoid nephrotoxins  Anemia -Secondary to menorrhagia -Will check CBC  General Health Maintenance Routine health maintenance discussed. Colon cancer screening with Cologuard not completed. - Encourage completion of Cologuard test for colon cancer screening.        Meds ordered this encounter  Medications   albuterol (VENTOLIN HFA) 108 (90 Base) MCG/ACT inhaler    Sig: Inhale 2 puffs into the lungs every 6 (six) hours as needed for wheezing or shortness of breath.    Dispense:  8 g    Refill:  2    Follow-up: Return in about 3 months (around 05/23/2024) for Chronic medical conditions.       Hoy Register, MD, FAAFP. [facility] [place], [place] [phone]   02/21/2024, 2:28 PM

## 2024-02-22 ENCOUNTER — Encounter: Payer: Self-pay | Admitting: Family Medicine

## 2024-02-22 LAB — CBC WITH DIFFERENTIAL/PLATELET
Basophils Absolute: 0 10*3/uL (ref 0.0–0.2)
Basos: 0 %
EOS (ABSOLUTE): 0.3 10*3/uL (ref 0.0–0.4)
Eos: 3 %
Hematocrit: 45.4 % (ref 34.0–46.6)
Hemoglobin: 14 g/dL (ref 11.1–15.9)
Immature Grans (Abs): 0 10*3/uL (ref 0.0–0.1)
Immature Granulocytes: 0 %
Lymphocytes Absolute: 2.2 10*3/uL (ref 0.7–3.1)
Lymphs: 18 %
MCH: 25.9 pg — ABNORMAL LOW (ref 26.6–33.0)
MCHC: 30.8 g/dL — ABNORMAL LOW (ref 31.5–35.7)
MCV: 84 fL (ref 79–97)
Monocytes Absolute: 0.8 10*3/uL (ref 0.1–0.9)
Monocytes: 7 %
Neutrophils Absolute: 8.7 10*3/uL — ABNORMAL HIGH (ref 1.4–7.0)
Neutrophils: 72 %
Platelets: 242 10*3/uL (ref 150–450)
RBC: 5.41 x10E6/uL — ABNORMAL HIGH (ref 3.77–5.28)
RDW: 17 % — ABNORMAL HIGH (ref 11.7–15.4)
WBC: 12.1 10*3/uL — ABNORMAL HIGH (ref 3.4–10.8)

## 2024-02-22 LAB — BRAIN NATRIURETIC PEPTIDE: BNP: 327.1 pg/mL — ABNORMAL HIGH (ref 0.0–100.0)

## 2024-02-24 ENCOUNTER — Telehealth: Payer: Self-pay

## 2024-02-24 NOTE — Telephone Encounter (Signed)
 LMOVM to follow up with the pt to see if she received her new monitor for the 3rd time.

## 2024-02-24 NOTE — Telephone Encounter (Signed)
 Patient called and given contact information for Respiratory clinic to reschedule your appointment.    Copied from CRM [id number]. Topic: General - Other >> Feb 24, 2024 10:38 AM Priscille Loveless wrote: Reason for CRM: Pt called about the appointment that was scheduled for Pulmonary testing and the day that it is scheduled for she already an appt and can not go that day. Please contact Ms Sabo and see what next day would work for her for this appointment. Please and thank you.

## 2024-03-02 ENCOUNTER — Inpatient Hospital Stay (HOSPITAL_COMMUNITY): Admission: RE | Admit: 2024-03-02 | Source: Ambulatory Visit

## 2024-03-02 DIAGNOSIS — R0609 Other forms of dyspnea: Secondary | ICD-10-CM | POA: Diagnosis not present

## 2024-03-03 LAB — PRO B NATRIURETIC PEPTIDE: NT-Pro BNP: 2639 pg/mL — ABNORMAL HIGH (ref 0–249)

## 2024-03-09 ENCOUNTER — Telehealth (HOSPITAL_COMMUNITY): Payer: Self-pay | Admitting: *Deleted

## 2024-03-09 NOTE — Telephone Encounter (Signed)
 Called to confirm/remind patient of their appointment [facility] on 03/12/24***.   Appointment:   [x] Confirmed  [] Left mess   [] No answer/No voice mail  [] Phone not in service  Patient reminded to bring all medications and/or complete list.  Confirmed patient has transportation. Gave directions, instructed to utilize valet parking.

## 2024-03-09 NOTE — Progress Notes (Addendum)
 PCP: Hoy Register, MD Endocrinology: Dr. Lonzo Cloud EP: Dr. Elberta Fortis Cardiology: Dr. Shirlee Latch  53 y.o. with history of type 2 diabetes, HTN, hyperlipidemia, complete heart block with PPM, and chronic diastolic CHF was referred by Dr. Delford Field for evaluation of CHF.  Patient has had diabetes since age 8 and has had HTN for at least 5 years.  She is a smoker and has a history of prior ETOH abuse with ETOH pancreatitis. In 3/21, she was admitted with DKA.  She was found to have associated complete heart block that did not resolve, she had St Jude PPM placed.  She had an echocardiogram showing EF 40-45% with severe LV hypertrophy.  She did not have a cardiac MRI prior to PPM placement.  Cath in 3/21 showed no significant CAD (nonischemic cardiomyopathy).   Echo (12/22): EF 70-75%, severe LVH, normal RV, mild to moderate MR.  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 She has a functioning orthotic.   cMRI (2/23) with LVEF 68%, severe LVH, ECV 54%, no LVOT gradient, poor nulling with uninterpretable delayed enhancement.   Follow up 5/23, NYHA II symptoms, volume overloaded. Lasix increase to 40 daily and genetic testing arranged to evaluate for TTR amyloidosis and hypertrophic cardiomyopathies. 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  Of note, "heart problems" run in family with mother and maternal grandparents having CHF.  Alpha galactosidase activity was low at 20.1.  She was seen for genetics counseling by Dr. 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   Echo 2/24 EF 65-70%, severe LVH, normal RV size and systolic function, GLS -6.4% (very abnormal), IVC normal.   Zio monitor in 3/24 showed 5 short SVT runs and rare PVCs.   She has been seen by Dr. Austin Miles in the Fabry's clinic at [facility].   Seen in ED 10/02/23 with CP and SOB. CXR showed pulmonary edema, BNP 353. Hs Troponins 55>>51. Given 40 mg IV Lasix with improvement in  symptoms. Home Lasix increased to 40/20 and he was discharged home.  Today she returns for HF follow up. Overall feeling fine. She has SOB walking further distances on flat ground. She is off [place] for unclear reasons. Denies palpitations, abnormal bleeding, CP, dizziness, edema, or PND/Orthopnea. Appetite ok. No fever or chills. She is not weighing at home. 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 Smokes 1-2 cigs/day, no ETOH or drugs.  ECG (personally reviewed): none ordered today  St Jude device interrogation (personally reviewed): >99% RV paced, no AT/AF  Labs (8/24): LDL 39 Labs (9/24): K 5.0, creatinine 1.74 Labs (10/24): K 4.0, creatinine 1.52 Labs (1/25): K 4.2, creatinine 1.36   PMH: 1. Type 2 diabetes: Diagnosed at age 57.  2. HTN 3. Hyperlipidemia 4. Active smoker 5. H/o ETOH pancreatitis 6. Bipolar disorder 7. Complete heart block: 3/21, associate with DKA episode.  Has St Jude PPM.  8. Chronic systolic CHF: Nonischemic cardiomyopathy.  Echo (3/21) with EF 40-45%, severe LVH, mild MR, normal RV.  - LHC (3/21) normal coronaries.  - PYP scan (6/21): Negative for TTR cardiac amyloidosis.  - cMRI (2/23): LVEF 68%, severe LVH, ECV 54%, no LVOT gradient, poor nulling with uninterpretable delayed enhancement.  - CTA chest (7/23): No evidence for pulmonary sarcoidosis, no ILD.  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  - Alpha-galactosidase activity testing low (20.1) - Echo (2/24): EF 65-70%, severe LVH, normal RV size and systolic function,  GLS -6.4% (very abnormal), IVC normal.  9. S/p left BKA (7/22). Has Phantom Limb pain. 10. CKD stage 3: Diabetic nephropathy, ?related to Fabry disease.  11. Zio monitor (3/24): 5 short SVT runs and rare PVCs.  SH: Divorced, works as a Lawyer, prior heavy ETOH now rare, cigarette smoker.  FH: Grandfather with CHF. No known sarcoidosis or amyloidosis.   ROS: All systems reviewed and negative except as per  HPI.   Current Outpatient Medications  Medication Sig Dispense Refill   albuterol (VENTOLIN HFA) 108 (90 Base) MCG/ACT inhaler Inhale 2 puffs into the lungs every 6 (six) hours as needed for wheezing or shortness of breath. 8 g 2   ARIPiprazole (ABILIFY) 15 MG tablet Take 1 tablet (15 mg total) by mouth daily. 30 tablet 2   atorvastatin (LIPITOR) 20 MG tablet Take 1 tablet (20 mg total) by mouth daily. 90 tablet 3   CARESTART COVID-19 HOME TEST KIT See admin instructions.     carvedilol (COREG) 12.5 MG tablet Take 1 tablet (12.5 mg total) by mouth 2 (two) times daily with a meal. 120 tablet 1   Continuous Glucose Sensor (DEXCOM G7 SENSOR) MISC 1 Device by Does not apply route as directed. 9 each 3   furosemide (LASIX) 20 MG tablet Take 100 mg by mouth daily.     GALAFOLD 123 MG CAPS Take by mouth.     hydrALAZINE (APRESOLINE) 50 MG tablet Take one three times daily 180 tablet 1   hydrOXYzine (ATARAX) 25 MG tablet Take 1 tablet (25 mg total) by mouth daily as needed for anxiety (or sleep). 30 tablet 2   Insulin Disposable Pump (OMNIPOD 5 G7 INTRO, GEN 5,) KIT 1 Device by Does not apply route every other day. 1 kit 0   insulin glargine (LANTUS SOLOSTAR) 100 UNIT/ML Solostar Pen Inject 60 Units into the skin daily. 60 mL 3   insulin lispro (HUMALOG) 100 UNIT/ML KwikPen Inject 14 Units into the skin. Before meals daily     Insulin Pen Needle 31G X 5 MM MISC Use as directed in the morning, at noon, in the evening, and at bedtime. 400 each 3   isosorbide mononitrate (ISMO) 20 MG tablet Take 1 tablet (20 mg total) by mouth daily. 90 tablet 3   valsartan (DIOVAN) 320 MG tablet Take 1 tablet (320 mg total) by mouth daily. 90 tablet 3   varenicline (CHANTIX CONTINUING MONTH PAK) 1 MG tablet Take 1 tablet (1 mg total) by mouth 2 (two) times daily. 60 tablet 1   dapagliflozin propanediol (FARXIGA) 10 MG TABS tablet Take 1 tablet (10 mg total) by mouth daily before breakfast. (Patient not taking: Reported on  03/12/2024) 30 tablet 11   No current facility-administered medications for this encounter.   Wt Readings from Last 3 Encounters:  03/12/24 107.1 kg (236 lb 3.2 oz)  02/21/24 109.4 kg (241 lb 3.2 oz)  02/01/24 104.3 kg (230 lb)   BP (!) 170/92   Ht 5\' 4"  (1.626 m)   Wt 107.1 kg (236 lb 3.2 oz)   BMI 40.54 kg/m  Physical Exam General:  NAD. No resp difficulty, walked into clinic HEENT: Normal Neck: Supple. No JVD. Cor: Regular rate & rhythm. No rubs, gallops or murmurs. Lungs: Clear Abdomen: Soft, obese, nontender, nondistended.  Extremities: No cyanosis, clubbing, rash, edema; sp L BKA Neuro: Alert & oriented x 3, moves all 4 extremities w/o difficulty. Affect pleasant.  Assessment/Plan: 1. Chronic systolic CHF: Echo in 3/21 with EF 40-45%, severe  LV hypertrophy.  This was found in association with complete heart block.  Nonischemic cardiomyopathy, cath in 3/21 showed no significant CAD.  PYP scan (6/21) not suggestive of TTR amyloidosis. cMRI (2/23) showed LVEF 68%, severe LVH, ECV 54%, no LVOT gradient, poor nulling with uninterpretable delayed enhancement. CTA chest did not show evidence for pulmonary sarcoidosis.  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  There is wide phenotypic variation in female Fabry's heterozygotes, but I suspect that the cardiomyopathy with LVH and h/o complete heart block in this situation is most likely due to Fabry's disease. Echo 2/24 showed EF 65-70%, severe LVH, normal RV size and systolic function, GLS -6.4% (very abnormal), IVC normal.  She has seen Dr. Austin Miles at [facility]. She is now on migalastat, an alpha-galactosidase A pharmacological chaperone.  Her alpha-gal variant leads to abnormal folding/less stable enzyme amenable to treatment with migalastat which should stabilize with enzyme and direct it to lysosome.  NYHA class II-IIb  symptoms, she is not volume overloaded on exam. - Restart Farxiga 10 mg daily.  BMET and BNP today. - Continue Lasix 100 mg daily - Continue hydralazine 50 mg tid + isosorbide mononitrate 20 mg daily.  - Continue Coreg 12.5 mg bid. - Continue valsartan 320 mg daily.  - Repeat echo next visit. 2. HTN: BP elevated today but she has not had her medications yet today. - GDMT as above. - I asked her to check BP daily and log. Notify clinic if sBP > 140 3. Hyperlipidemia: Continue atorvastatin. Good lipids 8/24 4. Smoking: Smokes 1-2 cigs/day. Discussed cessation - Continue Chantix. 5. Type 2 diabetes: Followed by Endocrinology. She is on insulin. - Blood sugars labile, most recent A1C 10.2. - She is followed by Endocrinology. 6. Complete heart block: She has a St Jude PPM and is pacing her RV > 99% of the time.  This is likely related to Fabry's disease. 7. Obesity: Body mass index is 40.54 kg/m. - Would avoid GLP-1 agonist with history of pancreatitis.  - Continue weight loss efforts.  Follow up in 3 months with Dr Shirlee Latch + echo  [facility] FNP-BC 03/12/2024

## 2024-03-12 ENCOUNTER — Ambulatory Visit (HOSPITAL_COMMUNITY)
Admission: RE | Admit: 2024-03-12 | Discharge: 2024-03-12 | Disposition: A | Discharge status: Home / Self Care | Source: Ambulatory Visit | Attending: Family Medicine | Admitting: Family Medicine

## 2024-03-12 ENCOUNTER — Encounter (HOSPITAL_COMMUNITY): Payer: Self-pay

## 2024-03-12 VITALS — BP 170/92 | HR 97 | Ht 64.0 in | Wt 236.2 lb

## 2024-03-12 DIAGNOSIS — Z720 Tobacco use: Secondary | ICD-10-CM

## 2024-03-12 DIAGNOSIS — Z79899 Other long term (current) drug therapy: Secondary | ICD-10-CM | POA: Diagnosis not present

## 2024-03-12 DIAGNOSIS — Z6841 Body Mass Index (BMI) 40.0 and over, adult: Secondary | ICD-10-CM | POA: Diagnosis not present

## 2024-03-12 DIAGNOSIS — E669 Obesity, unspecified: Secondary | ICD-10-CM | POA: Insufficient documentation

## 2024-03-12 DIAGNOSIS — I340 Nonrheumatic mitral (valve) insufficiency: Secondary | ICD-10-CM | POA: Diagnosis not present

## 2024-03-12 DIAGNOSIS — I428 Other cardiomyopathies: Secondary | ICD-10-CM | POA: Insufficient documentation

## 2024-03-12 DIAGNOSIS — I130 Hypertensive heart and chronic kidney disease with heart failure and stage 1 through stage 4 chronic kidney disease, or unspecified chronic kidney disease: Secondary | ICD-10-CM | POA: Diagnosis not present

## 2024-03-12 DIAGNOSIS — I10 Essential (primary) hypertension: Secondary | ICD-10-CM | POA: Diagnosis not present

## 2024-03-12 DIAGNOSIS — Z7984 Long term (current) use of oral hypoglycemic drugs: Secondary | ICD-10-CM | POA: Insufficient documentation

## 2024-03-12 DIAGNOSIS — Z950 Presence of cardiac pacemaker: Secondary | ICD-10-CM | POA: Insufficient documentation

## 2024-03-12 DIAGNOSIS — E785 Hyperlipidemia, unspecified: Secondary | ICD-10-CM | POA: Diagnosis not present

## 2024-03-12 DIAGNOSIS — N183 Chronic kidney disease, stage 3 unspecified: Secondary | ICD-10-CM | POA: Diagnosis not present

## 2024-03-12 DIAGNOSIS — E1122 Type 2 diabetes mellitus with diabetic chronic kidney disease: Secondary | ICD-10-CM | POA: Diagnosis not present

## 2024-03-12 DIAGNOSIS — E1169 Type 2 diabetes mellitus with other specified complication: Secondary | ICD-10-CM | POA: Diagnosis not present

## 2024-03-12 DIAGNOSIS — I442 Atrioventricular block, complete: Secondary | ICD-10-CM | POA: Diagnosis not present

## 2024-03-12 DIAGNOSIS — E66813 Obesity, class 3: Secondary | ICD-10-CM

## 2024-03-12 DIAGNOSIS — F17210 Nicotine dependence, cigarettes, uncomplicated: Secondary | ICD-10-CM | POA: Insufficient documentation

## 2024-03-12 DIAGNOSIS — Z794 Long term (current) use of insulin: Secondary | ICD-10-CM | POA: Diagnosis not present

## 2024-03-12 DIAGNOSIS — I5022 Chronic systolic (congestive) heart failure: Secondary | ICD-10-CM

## 2024-03-12 LAB — BASIC METABOLIC PANEL WITH GFR
Anion gap: 11 (ref 5–15)
BUN: 32 mg/dL — ABNORMAL HIGH (ref 6–20)
CO2: 21 mmol/L — ABNORMAL LOW (ref 22–32)
Calcium: 8.7 mg/dL — ABNORMAL LOW (ref 8.9–10.3)
Chloride: 103 mmol/L (ref 98–111)
Creatinine, Ser: 1.83 mg/dL — ABNORMAL HIGH (ref 0.44–1.00)
GFR, Estimated: 33 mL/min — ABNORMAL LOW (ref >60)
Glucose, Bld: 347 mg/dL — ABNORMAL HIGH (ref 70–99)
Potassium: 4.6 mmol/L (ref 3.5–5.1)
Sodium: 135 mmol/L (ref 135–145)

## 2024-03-12 LAB — BRAIN NATRIURETIC PEPTIDE: B Natriuretic Peptide: 521 pg/mL — ABNORMAL HIGH (ref 0.0–100.0)

## 2024-03-12 MED ORDER — DAPAGLIFLOZIN PROPANEDIOL 10 MG PO TABS: 10.0000 mg | ORAL_TABLET | Freq: Every day | ORAL | 11 refills | Status: AC

## 2024-03-12 MED ORDER — BLOOD PRESSURE CUFF MISC: 0 refills | Status: DC

## 2024-03-12 NOTE — Patient Instructions (Addendum)
 Thank you for coming in today  If you had labs drawn today, any labs that are abnormal the clinic will call you No news is good news  Medications: Restart Farxiga 10 mg 1 tablet daily  There was a blood pressure cuff  sent over to your pharmacy please check blood pressure daily call the clinic when Systolic blood pressure over 140.    Follow up appointments:  Your physician recommends that you schedule a follow-up appointment in:  3 months With Dr. Shirlee Latch with echocardiogram   Your physician has requested that you have an echocardiogram. Echocardiography is a painless test that uses sound waves to create images of your heart. It provides your doctor with information about the size and shape of your heart and how well your heart's chambers and valves are working. This procedure takes approximately one hour. There are no restrictions for this procedure.      Do the following things EVERYDAY: Weigh yourself in the morning before breakfast. Write it down and keep it in a log. Take your medicines as prescribed Eat low salt foods--Limit salt (sodium) to 2000 mg per day.  Stay as active as you can everyday Limit all fluids for the day to less than 2 liters   At the [facility], you and your health needs are our priority. As part of our continuing mission to provide you with exceptional heart care, we have created designated Provider Care Teams. These Care Teams include your primary Cardiologist (physician) and Advanced Practice Providers (APPs- Physician Assistants and Nurse Practitioners) who all work together to provide you with the care you need, when you need it.   You may see any of the following providers on your designated Care Team at your next follow up: Dr Arvilla Meres Dr Marca Ancona Dr. Marcos Eke, NP Robbie Lis, [place] [facility] [place], [place] Brynda Peon, NP Karle Plumber, PharmD   Please be sure to bring in all your  medications bottles to every appointment.    Thank you for choosing [facility]  If you have any questions or concerns before your next appointment please send [place] a message through [place] or call our office at [phone].    TO LEAVE A MESSAGE FOR THE NURSE SELECT OPTION 2, PLEASE LEAVE A MESSAGE INCLUDING: YOUR NAME DATE OF BIRTH CALL BACK NUMBER REASON FOR CALL**this is important as we prioritize the call backs  YOU WILL RECEIVE A CALL BACK THE SAME DAY AS LONG AS YOU CALL BEFORE 4:00 PM

## 2024-03-13 ENCOUNTER — Ambulatory Visit: Admitting: Orthopedic Surgery

## 2024-03-13 DIAGNOSIS — Z89512 Acquired absence of left leg below knee: Secondary | ICD-10-CM

## 2024-03-14 ENCOUNTER — Other Ambulatory Visit (HOSPITAL_COMMUNITY): Payer: Self-pay

## 2024-03-14 ENCOUNTER — Encounter: Payer: Self-pay | Admitting: Orthopedic Surgery

## 2024-03-14 NOTE — Progress Notes (Signed)
 Office Visit Note   [name]           Date of Birth: [birth year]           MRN: [id number] Visit Date: 03/13/2024              Requested by: Hoy Register, MD [address] [place] [address] [place],  [place] [address] PCP: Hoy Register, MD  Chief Complaint  Patient presents with   Left Leg - Follow-up      HPI: Patient is a 53 year old woman who is a left transtibial amputation.  Patient is subsiding into her socket with decreased volume of the residual limb and a torn liner.  Assessment & Plan: Visit Diagnoses:  1. Left below-knee amputee [facility])     Plan: Patient was provided a prescription for [employer] for new socket and liner.  She is given a medium under liner.  Follow-Up Instructions: Return if symptoms worsen or fail to improve.   Ortho Exam  Patient is alert, oriented, no adenopathy, well-dressed, normal affect, normal respiratory effort. Examination patient has significant decreased volume in the residual limb.  There are no open ulcers.  Patient is an existing left transtibial  amputee.  Patient's current comorbidities are not expected to impact the ability to function with the prescribed prosthesis. Patient verbally communicates a strong desire to use a prosthesis. Patient currently requires mobility aids to ambulate without a prosthesis.  Expects not to use mobility aids with a new prosthesis. Patient is expected to resume or reach their K Level within 6 months. Patient was active before the amputation and independent with stairs, uneven terrain, varying cadence, and a community ambulator.  Patient is a K2 level ambulator that will use a prosthesis to walk around their home and the community over low level environmental barriers.   Patient has no rotational stability with the residual limb and feels like she is at risk of falling.   Imaging: No results found. No images are attached to the encounter.  Labs: Lab Results  Component Value Date    HGBA1C 10.2 (A) 11/25/2023   HGBA1C 10.9 (A) 06/30/2023   HGBA1C 10.9 (H) 04/05/2023   LABURIC 4.1 10/21/2016   LABURIC 4.2 11/20/2010   REPTSTATUS 12/17/2021 FINAL 12/15/2021   GRAMSTAIN NO WBC SEEN NO ORGANISMS SEEN  08/19/2021   CULT >=100,000 COLONIES/mL ESCHERICHIA COLI (A) 12/15/2021   LABORGA ESCHERICHIA COLI (A) 12/15/2021     Lab Results  Component Value Date   ALBUMIN 3.6 (L) 12/22/2023   ALBUMIN 3.4 (L) 04/05/2023   ALBUMIN 3.8 (L) 01/13/2023    Lab Results  Component Value Date   MG 1.7 06/28/2021   MG 1.8 06/27/2021   MG 1.7 06/27/2021   Lab Results  Component Value Date   VD25OH 24.1 (L) 03/11/2020   VD25OH <4.20 (L) 02/15/2020    No results found for: "PREALBUMIN"    Latest Ref Rng & Units 02/21/2024    2:33 PM 01/06/2024    9:37 PM 10/02/2023    6:23 PM  CBC EXTENDED  WBC 3.4 - 10.8 x10E3/uL 12.1  7.6  7.0   RBC 3.77 - 5.28 x10E6/uL 5.41  2.94  2.78   Hemoglobin 11.1 - 15.9 g/dL 44.0  7.9  8.0   HCT 10.2 - 46.6 % 45.4  26.1  25.5   Platelets 150 - 450 x10E3/uL 242  216  230   NEUT# 1.4 - 7.0 x10E3/uL 8.7     Lymph# 0.7 - 3.1  x10E3/uL 2.2        There is no height or weight on file to calculate BMI.  Orders:  No orders of the defined types were placed in this encounter.  No orders of the defined types were placed in this encounter.    Procedures: No procedures performed  Clinical Data: No additional findings.  ROS:  All other systems negative, except as noted in the HPI. Review of Systems  Objective: Vital Signs: There were no vitals taken for this visit.  Specialty Comments:  No specialty comments available.  PMFS History: Patient Active Problem List   Diagnosis Date Noted   Menorrhagia with regular cycle 02/21/2024   Presence of heart assist device (HCC) 12/22/2023   Skin rash 06/28/2023   PTSD (post-traumatic stress disorder) 05/13/2023   Cardiac amyloidosis (HCC) 04/27/2023   Type 2 diabetes mellitus with  microalbuminuria, with long-term current use of insulin (HCC) 12/15/2022   Peripheral vascular disease, unspecified (HCC) 03/22/2022   Type 2 diabetes mellitus with diabetic polyneuropathy, with long-term current use of insulin (HCC) 02/03/2022   Type 2 diabetes mellitus with hyperglycemia, with long-term current use of insulin (HCC) 02/03/2022   Microalbuminuria 02/03/2022   Right great toe amputee (HCC) 11/03/2021   Class 2 severe obesity due to excess calories with serious comorbidity and body mass index (BMI) of 36.0 to 36.9 in adult (HCC) 10/15/2021   Insomnia 10/07/2021   Left below-knee amputee (HCC) 07/01/2021   Normocytic anemia 06/26/2021   Heart failure with mid-range ejection fraction (HCC) 06/26/2021   Diabetic nephropathy with proteinuria (HCC) 06/26/2021   Vaginal discharge 07/16/2020   Dental caries 04/01/2020   Cracked tooth 04/01/2020   Vitamin D deficiency 03/11/2020   Prolonged QT interval 02/26/2020   Chronic combined systolic and diastolic CHF (congestive heart failure) (HCC) 02/26/2020   Heart block AV complete (HCC) 02/11/2020   Exocrine pancreatic insufficiency    Dysmenorrhea 10/02/2019   DM type 2 with diabetic peripheral neuropathy (HCC) 05/30/2019   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 block) 05/29/2019   Former smoker 11/26/2013   Hypercholesteremia 02/02/2007   Bipolar 1 disorder, depressed (HCC) 02/02/2007   HYPERTENSION, BENIGN SYSTEMIC 02/02/2007   Past Medical History:  Diagnosis Date   Abnormal Pap smear 1998   Abnormal vaginal bleeding 12/19/2011   Acute urinary retention 06/26/2021   Arthritis    Bacterial infection    Bipolar 1 disorder (HCC)    Blister of second toe of left foot 11/21/2016   Candida vaginitis 07/2007   Depression    recently added wellbutrin-has not taken yet for bipolar   Diabetes in pregnancy    Diabetes mellitus    nph 20U qam and qpm, regular with meals   Diabetic ketoacidosis without coma  associated with type 2 diabetes mellitus (HCC)    Fibroid    Galactorrhea of right breast 2008   GERD (gastroesophageal reflux disease)    H/O amenorrhea 07/2007   H/O dizziness 10/14/2011   H/O dysmenorrhea 2010   H/O menorrhagia 10/14/2011   H/O varicella    Headache(784.0)    Heavy vaginal bleeding due to contraceptive injection use 10/12/2011   Depo Provera   Herpes    Homelessness 11/19/2021   HSV-2 infection 01/03/2009   Hx: UTI (urinary tract infection) 2009   Hypertension    on aldomet   Hypoalbuminemia due to protein-calorie malnutrition (HCC) 06/26/2021   Increased BMI 2010   Irregular uterine bleeding 04/04/2012   Pt has mirena  Obesity 10/14/2011   Oligomenorrhea 07/2007   Osteomyelitis of great toe of right foot (HCC) 03/22/2022   Pelvic pain in female    Presence of permanent cardiac pacemaker    Preterm labor    PTSD (post-traumatic stress disorder)    Trichomonas    Yeast infection     Family History  Problem Relation Age of Onset   Hypertension Mother    Diabetes Mother    Heart disease Mother    Hypertension Father    Diabetes Father    Heart disease Father    Depression Maternal Grandmother    Suicidality Maternal Grandmother    Stroke Maternal Grandfather    Schizophrenia Paternal Grandmother    Other Neg Hx    Breast cancer Neg Hx     Past Surgical History:  Procedure Laterality Date   ABDOMINAL AORTOGRAM W/LOWER EXTREMITY N/A 06/17/2021   Procedure: ABDOMINAL AORTOGRAM W/LOWER EXTREMITY;  Surgeon: Iran Ouch, MD;  Location: [facility] INVASIVE CV LAB;  Service: Cardiovascular;  Laterality: N/A;   AMPUTATION Left 06/27/2021   Procedure: AMPUTATION BELOW KNEE;  Surgeon: Nadara Mustard, MD;  Location: [facility] OR;  Service: Orthopedics;  Laterality: Left;   AMPUTATION Right 09/04/2021   Procedure: RIGHT GREAT TOE AMPUTATION;  Surgeon: Nadara Mustard, MD;  Location[facility] OR;  Service: Orthopedics;  Laterality: Right;   CESAREAN SECTION  1991   LEFT  HEART CATH AND CORONARY ANGIOGRAPHY N/A 02/10/2020   Procedure: LEFT HEART CATH AND CORONARY ANGIOGRAPHY;  Surgeon: Lennette Bihari, MD;  Location: [facility] INVASIVE CV LAB;  Service: Cardiovascular;  Laterality: N/A;   PACEMAKER IMPLANT N/A 02/13/2020   Procedure: PACEMAKER IMPLANT;  Surgeon: Regan Lemming, MD;  Location: [facility] INVASIVE CV LAB;  Service: Cardiovascular;  Laterality: N/A;   TEMPORARY PACEMAKER N/A 02/10/2020   Procedure: TEMPORARY PACEMAKER;  Surgeon: Lennette Bihari, MD;  Location: [facility] INVASIVE CV LAB;  Service: Cardiovascular;  Laterality: N/A;   Social History   Occupational History   Not on file  Tobacco Use   Smoking status: Former    Current packs/day: 0.00    Average packs/day: 0.5 packs/day for 20.0 years (10.0 ttl pk-yrs)    Types: Cigarettes    Start date: 07/01/2001    Quit date: 07/01/2021    Years since quitting: 2.7   Smokeless tobacco: Never   Tobacco comments:    1. Centrix to stop smoking   Vaping Use   Vaping status: Never Used  Substance and Sexual Activity   Alcohol use: Not Currently    Comment: denies recently; history of excessive use   Drug use: No   Sexual activity: Yes    Partners: Female, Female    Birth control/protection: None
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
## 2024-03-20 ENCOUNTER — Encounter (HOSPITAL_COMMUNITY): Payer: Self-pay

## 2024-03-20 ENCOUNTER — Encounter (HOSPITAL_COMMUNITY): Payer: Medicaid Other | Admitting: Psychiatry

## 2024-03-28 ENCOUNTER — Other Ambulatory Visit: Payer: Self-pay | Admitting: Critical Care Medicine

## 2024-04-04 ENCOUNTER — Encounter: Payer: Self-pay | Admitting: Cardiology

## 2024-04-13 ENCOUNTER — Ambulatory Visit
Admission: EM | Admit: 2024-04-13 | Discharge: 2024-04-13 | Disposition: A | Discharge status: Home / Self Care | Attending: Internal Medicine | Admitting: Internal Medicine

## 2024-04-13 DIAGNOSIS — I130 Hypertensive heart and chronic kidney disease with heart failure and stage 1 through stage 4 chronic kidney disease, or unspecified chronic kidney disease: Secondary | ICD-10-CM | POA: Insufficient documentation

## 2024-04-13 DIAGNOSIS — Z7984 Long term (current) use of oral hypoglycemic drugs: Secondary | ICD-10-CM | POA: Diagnosis not present

## 2024-04-13 DIAGNOSIS — E1122 Type 2 diabetes mellitus with diabetic chronic kidney disease: Secondary | ICD-10-CM | POA: Insufficient documentation

## 2024-04-13 DIAGNOSIS — N189 Chronic kidney disease, unspecified: Secondary | ICD-10-CM | POA: Diagnosis not present

## 2024-04-13 DIAGNOSIS — I5042 Chronic combined systolic (congestive) and diastolic (congestive) heart failure: Secondary | ICD-10-CM | POA: Diagnosis not present

## 2024-04-13 DIAGNOSIS — Z87891 Personal history of nicotine dependence: Secondary | ICD-10-CM | POA: Diagnosis not present

## 2024-04-13 DIAGNOSIS — Z794 Long term (current) use of insulin: Secondary | ICD-10-CM | POA: Diagnosis not present

## 2024-04-13 DIAGNOSIS — N3001 Acute cystitis with hematuria: Secondary | ICD-10-CM | POA: Diagnosis not present

## 2024-04-13 LAB — POCT URINALYSIS DIP (MANUAL ENTRY)
Bilirubin, UA: NEGATIVE
Glucose, UA: 250 mg/dL — AB
Ketones, POC UA: NEGATIVE mg/dL
Leukocytes, UA: NEGATIVE
Nitrite, UA: NEGATIVE
Protein Ur, POC: >=300 mg/dL — AB
Spec Grav, UA: 1.025 (ref 1.010–1.025)
Urobilinogen, UA: 0.2 U/dL
pH, UA: 6 (ref 5.0–8.0)

## 2024-04-13 MED ORDER — CEPHALEXIN 500 MG PO CAPS
500.0000 mg | ORAL_CAPSULE | Freq: Two times a day (BID) | ORAL | 0 refills | Status: AC
Start: 2024-04-13 — End: 2024-04-20

## 2024-04-13 NOTE — ED Provider Notes (Signed)
 [facility]    CSN: [id number] Arrival date & time: 04/13/24  0900      History   Chief Complaint Chief Complaint  Patient presents with   UTI Symptoms    HPI Sarah Long is a 53 y.o. female.   Patient presents to urgent care for evaluation of urinary frequency and urinary urgency that started approximately 10-14 days ago and dysuria and right lower back pain that started 2-3 days ago. She states she takes lasix  due to CHF and wonders if this could be contributing to symptoms, denies recent dose changes.  Right low back discomfort is worsened by certain positions, pain does not radiate to the legs or abdomen. No saddle anesthesia, changes in gait, unilateral extremity weakness, or paresthesias. Denies dizziness, recent weight changes, nausea, vomiting, diarrhea, abdominal pain, flank pain, fevers, chills, vaginal symptoms, and urinary hesitancy/gross hematuria. She is a diabetic and admits to not checking blood sugars often. Denies recent intake of urinary irritants, drinks mostly water . No recent UTIs. She has not tried any OTC medicines for symptoms.   Corrected creatinine clearance 44     Past Medical History:  Diagnosis Date   Abnormal Pap smear 1998   Abnormal vaginal bleeding 12/19/2011   Acute urinary retention 06/26/2021   Arthritis    Bacterial infection    Bipolar 1 disorder (HCC)    Blister of second toe of left foot 11/21/2016   Candida vaginitis 07/2007   Depression    recently added wellbutrin-has not taken yet for bipolar   Diabetes in pregnancy    Diabetes mellitus    nph 20U qam and qpm, regular with meals   Diabetic ketoacidosis without coma associated with type 2 diabetes mellitus (HCC)    Fibroid    Galactorrhea of right breast 2008   GERD (gastroesophageal reflux disease)    H/O amenorrhea 07/2007   H/O dizziness 10/14/2011   H/O dysmenorrhea 2010   H/O menorrhagia 10/14/2011   H/O varicella    Headache(784.0)    Heavy vaginal  bleeding due to contraceptive injection use 10/12/2011   Depo Provera    Herpes    Homelessness 11/19/2021   HSV-2 infection 01/03/2009   Hx: UTI (urinary tract infection) 2009   Hypertension    on aldomet   Hypoalbuminemia due to protein-calorie malnutrition (HCC) 06/26/2021   Increased BMI 2010   Irregular uterine bleeding 04/04/2012   Pt has mirena    Obesity 10/14/2011   Oligomenorrhea 07/2007   Osteomyelitis of great toe of right foot (HCC) 03/22/2022   Pelvic pain in female    Presence of permanent cardiac pacemaker    Preterm labor    PTSD (post-traumatic stress disorder)    Trichomonas    Yeast infection     Patient Active Problem List   Diagnosis Date Noted   Menorrhagia with regular cycle 02/21/2024   Presence of heart assist device (HCC) 12/22/2023   Skin rash 06/28/2023   PTSD (post-traumatic stress disorder) 05/13/2023   Cardiac amyloidosis (HCC) 04/27/2023   Type 2 diabetes mellitus with microalbuminuria, with long-term current use of insulin  (HCC) 12/15/2022   Peripheral vascular disease, unspecified (HCC) 03/22/2022   Type 2 diabetes mellitus with diabetic polyneuropathy, with long-term current use of insulin  (HCC) 02/03/2022   Type 2 diabetes mellitus with hyperglycemia, with long-term current use of insulin  (HCC) 02/03/2022   Microalbuminuria 02/03/2022   Right great toe amputee (HCC) 11/03/2021   Class 2 severe obesity due to excess calories with serious comorbidity and  body mass index (BMI) of 36.0 to 36.9 in adult [facility]) 10/15/2021   Insomnia 10/07/2021   Left below-knee amputee (HCC) 07/01/2021   Normocytic anemia 06/26/2021   Heart failure with mid-range ejection fraction (HCC) 06/26/2021   Diabetic nephropathy with proteinuria (HCC) 06/26/2021   Vaginal discharge 07/16/2020   Dental caries 04/01/2020   Cracked tooth 04/01/2020   Vitamin D  deficiency 03/11/2020   Prolonged QT interval 02/26/2020   Chronic combined systolic and diastolic CHF  (congestive heart failure) (HCC) 02/26/2020   Heart block AV complete (HCC) 02/11/2020   Exocrine pancreatic insufficiency    Dysmenorrhea 10/02/2019   DM type 2 with diabetic peripheral neuropathy (HCC) 05/30/2019   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 block) 05/29/2019   Former smoker 11/26/2013   Hypercholesteremia 02/02/2007   Bipolar 1 disorder, depressed (HCC) 02/02/2007   HYPERTENSION, BENIGN SYSTEMIC 02/02/2007    Past Surgical History:  Procedure Laterality Date   ABDOMINAL AORTOGRAM W/LOWER EXTREMITY N/A 06/17/2021   Procedure: ABDOMINAL AORTOGRAM W/LOWER EXTREMITY;  Surgeon: Wenona Hamilton, MD;  Location: [facility] IN[facility];  Service: Cardiovascular;  Laterality: N/A;   AMPUTATION Left 06/27/2021   Procedure: AMPUTATION BELOW KNEE;  Surgeon: Timothy Ford, MD;  Location: [facility];  Service: Orthopedics;  Laterality: Left;   AMPUTATION Right 09/04/2021   Procedure: RIGHT GREAT TOE AMPUTATION;  Surgeon: Timothy Ford, MD;  Location: [facility] OR;  Service: Orthopedics;  Laterality: Right;   CESAREAN SECTION  1991   LEFT HEART CATH AND CORONARY ANGIOGRAPHY N/A 02/10/2020   Procedure: LEFT HEART CATH AND CORONARY ANGIOGRAPHY;  Surgeon: Millicent Ally, MD;  Location: [facility] INV[facility] CV [facility];  Service: Cardiovascular;  Laterality: N/A;   PACEMAKER IMPLANT N/A 02/13/2020   Procedure: PACEMAKER IMPLANT;  Surgeon: Lei Pump, MD;  Location: [facility];  Service: Cardiovascular;  Laterality: N/A;   TEMPORARY PACEMAKER N/A 02/10/2020   Procedure: TEMPORARY PACEMAKER;  Surgeon: Millicent Ally, MD;  Location: [facility] INVASIVE CV LAB;  Service: Cardiovascular;  Laterality: N/A;    OB History     Gravida  6   Para  4   Term  1   Preterm  3   AB  2   Living  4      SAB  1   IAB  1   Ectopic  0   Multiple  0   Live Births  3            Home Medications    Prior to Admission medications   Medication Sig Start Date End Date Taking?  Authorizing Provider  cephALEXin  (KEFLEX ) 500 MG capsule Take 1 capsule (500 mg total) by mouth 2 (two) times daily for 7 days. 04/13/24 04/20/24 Yes Starlene Eaton, FNP  Insulin  Disposable Pump (OMNIPOD 5 DEXG7G6 PODS GEN 5) MISC SMARTSIG:SUB-Q Every Other Day 03/14/24  Yes [provider]  albuterol  (VENTOLIN  HFA) 108 (90 Base) MCG/ACT inhaler Inhale 2 puffs into the lungs every 6 (six) hours as needed for wheezing or shortness of breath. 02/21/24   Newlin, Enobong, MD  ARIPiprazole  (ABILIFY ) 15 MG tablet Take 1 tablet (15 mg total) by mouth daily. 02/21/24   Bahraini, Sarah A  atorvastatin  (LIPITOR ) 20 MG tablet Take 1 tablet (20 mg total) by mouth daily. 12/22/23   Vernell Goldsmith, MD  Blood Pressure Monitoring (BLOOD PRESSURE CUFF) MISC Please monitor and check blood pressure daily please call the clinic if blood pressure is over 140 03/12/24  Milford, Arlice Bene, FNP  CARESTART COVID-19 HOME TEST KIT See admin instructions. 08/15/23   [provider]  carvedilol  (COREG ) 12.5 MG tablet Take 1 tablet (12.5 mg total) by mouth 2 (two) times daily with a meal. 12/22/23   Vernell Goldsmith, MD  Continuous Glucose Sensor (DEXCOM G7 SENSOR) MISC 1 Device by Does not apply route as directed. 08/22/23   Shamleffer, Ibtehal Jaralla, MD  dapagliflozin  propanediol (FARXIGA ) 10 MG TABS tablet Take 1 tablet (10 mg total) by mouth daily before breakfast. 03/12/24   Milford, Arlice Bene, FNP  furosemide  (LASIX ) 20 MG tablet Take 100 mg by mouth daily.    [provider]  GALAFOLD 123 MG CAPS Take by mouth. 08/03/23   [provider]  hydrALAZINE  (APRESOLINE ) 50 MG tablet Take one three times daily 12/22/23   Vernell Goldsmith, MD  hydrOXYzine  (ATARAX ) 25 MG tablet Take 1 tablet (25 mg total) by mouth daily as needed for anxiety (or sleep). 02/21/24 05/21/24  Bahraini, Sarah A  Insulin  Disposable Pump (OMNIPOD 5 G7 INTRO, GEN 5,) KIT 1 Device by Does not apply route every other day. 08/22/23    Shamleffer, Ibtehal Jaralla, MD  insulin  glargine (LANTUS  SOLOSTAR) 100 UNIT/ML Solostar Pen Inject 60 Units into the skin daily. 06/30/23   Shamleffer, Ibtehal Jaralla, MD  insulin  lispro (HUMALOG ) 100 UNIT/ML injection MAX DAILY 125 UNITS A DAY PER PUMP 03/29/24   Newlin, Enobong, MD  insulin  lispro (HUMALOG ) 100 UNIT/ML KwikPen Inject 14 Units into the skin. Before meals daily    [provider]  Insulin  Pen Needle 31G X 5 MM MISC Use as directed in the morning, at noon, in the evening, and at bedtime. 06/30/23   Shamleffer, Ibtehal Jaralla, MD  isosorbide  mononitrate (ISMO ) 20 MG tablet Take 1 tablet (20 mg total) by mouth daily. 12/22/23   Vernell Goldsmith, MD  valsartan  (DIOVAN ) 320 MG tablet Take 1 tablet (320 mg total) by mouth daily. 12/22/23   Vernell Goldsmith, MD  varenicline  (CHANTIX  CONTINUING MONTH PAK) 1 MG tablet Take 1 tablet (1 mg total) by mouth 2 (two) times daily. 03/15/23   Vernell Goldsmith, MD  spironolactone  (ALDACTONE ) 25 MG tablet HOLD 04/27/23 04/27/23  Vernell Goldsmith, MD    Family History Family History  Problem Relation Age of Onset   Hypertension Mother    Diabetes Mother    Heart disease Mother    Hypertension Father    Diabetes Father    Heart disease Father    Depression Maternal Grandmother    Suicidality Maternal Grandmother    Stroke Maternal Grandfather    Schizophrenia Paternal Grandmother    Other Neg Hx    Breast cancer Neg Hx     Social History Social History   Tobacco Use   Smoking status: Former    Current packs/day: 0.00    Average packs/day: 0.5 packs/day for 20.0 years (10.0 ttl pk-yrs)    Types: Cigarettes    Start date: 07/01/2001    Quit date: 07/01/2021    Years since quitting: 2.7   Smokeless tobacco: Never   Tobacco comments:    1. Centrix to stop smoking   Vaping Use   Vaping status: Never Used  Substance Use Topics   Alcohol use: Not Currently    Comment: denies recently; history of excessive use   Drug use: No      Allergies   Lamictal  [lamotrigine ], Strawberry extract, and Sulfa antibiotics   Review of Systems Review  of Systems Per HPI  Physical Exam Triage Vital Signs ED Triage Vitals  Encounter Vitals Group     BP 04/13/24 0916 (!) 163/92     Systolic BP Percentile --      Diastolic BP Percentile --      Pulse Rate 04/13/24 0916 88     Resp 04/13/24 0916 18     Temp 04/13/24 0916 98.5 F (36.9 C)     Temp Source 04/13/24 0916 Oral     SpO2 04/13/24 0916 98 %     Weight 04/13/24 0913 239 lb (108.4 kg)     Height 04/13/24 0913 5\' 4"  (1.626 m)     Head Circumference --      Peak Flow --      Pain Score 04/13/24 0911 8     Pain Loc --      Pain Education --      Exclude from Growth Chart --    No data found.  Updated Vital Signs BP (!) 163/92 (BP Location: Left Arm) Comment: "I haven't taken my meds yet this morning"  Pulse 88   Temp 98.5 F (36.9 C) (Oral)   Resp 18   Ht 5\' 4"  (1.626 m)   Wt 239 lb (108.4 kg)   LMP 03/25/2024 (Approximate)   SpO2 98%   BMI 41.02 kg/m   Visual Acuity Right Eye Distance:   Left Eye Distance:   Bilateral Distance:    Right Eye Near:   Left Eye Near:    Bilateral Near:     Physical Exam Vitals and nursing note reviewed.  Constitutional:      Appearance: She is not ill-appearing or toxic-appearing.  HENT:     Head: Normocephalic and atraumatic.     Right Ear: Hearing, tympanic membrane, ear canal and external ear normal.     Left Ear: Hearing, tympanic membrane, ear canal and external ear normal.     Nose: Nose normal.     Mouth/Throat:     Lips: Pink.     Mouth: Mucous membranes are moist. No injury or oral lesions.     Dentition: Normal dentition.     Tongue: No lesions.     Pharynx: Oropharynx is clear. Uvula midline. No pharyngeal swelling, oropharyngeal exudate, posterior oropharyngeal erythema, uvula swelling or postnasal drip.     Tonsils: No tonsillar exudate.  Eyes:     General: Lids are normal. Vision grossly  intact. Gaze aligned appropriately.     Extraocular Movements: Extraocular movements intact.     Conjunctiva/sclera: Conjunctivae normal.  Neck:     Trachea: Trachea and phonation normal.  Cardiovascular:     Rate and Rhythm: Normal rate and regular rhythm.     Heart sounds: Normal heart sounds, S1 normal and S2 normal.  Pulmonary:     Effort: Pulmonary effort is normal. No respiratory distress.     Breath sounds: Normal breath sounds and air entry.  Abdominal:     General: Abdomen is flat. Bowel sounds are normal. There is no distension.     Palpations: Abdomen is soft.     Tenderness: There is no abdominal tenderness. There is no right CVA tenderness, left CVA tenderness or guarding.  Musculoskeletal:     Cervical back: Normal and neck supple.     Thoracic back: Normal.     Lumbar back: Tenderness present. No swelling, edema, deformity, signs of trauma, lacerations, spasms or bony tenderness. Normal range of motion. No scoliosis.  Back:  Lymphadenopathy:     Cervical: No cervical adenopathy.  Skin:    General: Skin is warm and dry.     Capillary Refill: Capillary refill takes less than 2 seconds.     Findings: No rash.  Neurological:     General: No focal deficit present.     Mental Status: She is alert and oriented to person, place, and time. Mental status is at baseline.     Cranial Nerves: No dysarthria or facial asymmetry.  Psychiatric:        Mood and Affect: Mood normal.        Speech: Speech normal.        Behavior: Behavior normal.        Thought Content: Thought content normal.        Judgment: Judgment normal.      UC Treatments / Results  Labs (all labs ordered are listed, but only abnormal results are displayed) Labs Reviewed  URINE CULTURE - Abnormal; Notable for the following components:      Result Value   Culture MULTIPLE SPECIES PRESENT, SUGGEST RECOLLECTION (*)    All other components within normal limits  POCT URINALYSIS DIP (MANUAL ENTRY) -  Abnormal; Notable for the following components:   Color, UA light yellow (*)    Clarity, UA cloudy (*)    Glucose, UA =250 (*)    Blood, UA moderate (*)    Protein Ur, POC >=300 (*)    All other components within normal limits    EKG   Radiology No results found.  Procedures Procedures (including critical care time)  Medications Ordered in UC Medications - No data to display  Initial Impression / Assessment and Plan / UC Course  I have reviewed the triage vital signs and the nursing notes.  Pertinent labs & imaging results that were available during my care of the patient were reviewed by me and considered in my medical decision making (see chart for details).   1. Acute cystitis with hematuria, CKD Presentation is suspicious for acute uncomplicated UTI.  Low suspicion for pyelonephritis, kidney stone, etc.  Reviewed recent BMP showing chronically reduced renal function. Creatinine clearance is 44 ml/min based on most recent creatinine. She may have normal dosing of keflex  to treat UTI based on creatinine clearance per up to date. Advised to avoid urinary irritants and continue drinking water  within limits due to CHF/renal dysfunction. Recommend follow-up with PCP to re-check urine and ensure symptoms have improved. Urine culture pending, staff will call if urine culture results change treatment plan.   Counseled patient on potential for adverse effects with medications prescribed/recommended today, strict ER and return-to-clinic precautions discussed, patient verbalized understanding.    Final Clinical Impressions(s) / UC Diagnoses   Final diagnoses:  Acute cystitis with hematuria  Chronic kidney disease, unspecified CKD stage     Discharge Instructions      Your urine shows you likely have a urinary tract infection.  I have sent your urine for culture to confirm this.  We will call you if we need to change your antibiotic when we find out the type of bacteria  growing in your bladder.  Take antibiotic as directed with a snack/food to avoid stomach upset. To avoid GI upset please take this medication with food.   Avoid drinking beverages that irritate the urinary tract like sodas, tea, coffee, or juice. Drink plenty of water  to stay well hydrated and prevent severe infection.  If you develop any new or  worsening symptoms or if your symptoms do not start to improve, pleases return here or follow-up with your primary care provider. If your symptoms are severe, please go to the emergency room.   ED Prescriptions     Medication Sig Dispense Auth. Provider   cephALEXin  (KEFLEX ) 500 MG capsule Take 1 capsule (500 mg total) by mouth 2 (two) times daily for 7 days. 14 capsule Starlene Eaton, FNP      PDMP not reviewed this encounter.   Starlene Eaton, [place] 04/14/24 2124

## 2024-04-13 NOTE — Discharge Instructions (Signed)
 Your urine shows you likely have a urinary tract infection.  I have sent your urine for culture to confirm this.  We will call you if we need to change your antibiotic when we find out the type of bacteria growing in your bladder.  Take antibiotic as directed with a snack/food to avoid stomach upset. To avoid GI upset please take this medication with food.   Avoid drinking beverages that irritate the urinary tract like sodas, tea, coffee, or juice. Drink plenty of water to stay well hydrated and prevent severe infection.  If you develop any new or worsening symptoms or if your symptoms do not start to improve, pleases return here or follow-up with your primary care provider. If your symptoms are severe, please go to the emergency room.

## 2024-04-13 NOTE — ED Triage Notes (Signed)
"  This started about 2 wks ago with frequency with urination and now burning with urination too and lower right back pain". No fever.

## 2024-04-14 LAB — URINE CULTURE

## 2024-05-01 ENCOUNTER — Encounter
Payer: Medicaid Other | Attending: Physical Medicine and Rehabilitation | Admitting: Physical Medicine and Rehabilitation

## 2024-05-01 VITALS — BP 161/89 | HR 87 | Ht 64.0 in | Wt 239.0 lb

## 2024-05-01 DIAGNOSIS — G546 Phantom limb syndrome with pain: Secondary | ICD-10-CM | POA: Insufficient documentation

## 2024-05-01 DIAGNOSIS — Z6841 Body Mass Index (BMI) 40.0 and over, adult: Secondary | ICD-10-CM | POA: Diagnosis not present

## 2024-05-01 DIAGNOSIS — I10 Essential (primary) hypertension: Secondary | ICD-10-CM | POA: Diagnosis not present

## 2024-05-01 DIAGNOSIS — E66813 Obesity, class 3: Secondary | ICD-10-CM | POA: Diagnosis not present

## 2024-05-01 DIAGNOSIS — R600 Localized edema: Secondary | ICD-10-CM | POA: Insufficient documentation

## 2024-05-01 NOTE — Progress Notes (Signed)
 Subjective:    Patient ID: Sarah Long, female    DOB: Nov 02, 1971, 53 y.o.   MRN: [id number]  HPI Mrs. Sherrill is a 53 year old woman who presents for f/u  left BKA phantom limb pain, menstrual pain.   1) Type 2 DM -she has an appointment with an endocrinologist -has a dexcom and omnipod to given her 2U insulin  every hour -CBGs have been very well controlled.  -she has tingling and numbness in her right foot -she is getting phantom limb pain in left leg.  -she has follow-up with Dr. Julio Ohm  and PCP -she is willing to try nerve medications -she has neuropathic pain throughout her right lower extremity -does not require any refills today -she still has neuropathy in her other foot.   2) Constipation: -she does not lie the Miralax  -she is worried about being constipated wit the iron .   3) Left BKA phantom limb pain -has started again -trialed Tens unit for her today -Qutenza  was very helpful in the past and resulted I complete relief but it burned severely when she went home so she prefers another option -tolerating prosthesis well -comes and goes  4) Right lower extremity edema -compression garments ordered by Dr. Julio Ohm -she does not like how much the lasix  makes her urinate -does not require any refills today -improved with lasix , she takes as needed  5) Menarche -returned and can be painful -like clockwark the week before she started feeling pain week before  Saffron has been helpful  6) HTN -BP up to 161/89 -on lasix   7) Neuropathy in right foot  -she has pain there as well  8) Obesity: -she feels out of breath during sex and when walking and would like to lose weight  Pain Inventory Average Pain 2 Pain Right Now 1 My pain is intermittent, burning, and tingling  In the last 24 hours, has pain interfered with the following? General activity 0 Relation with others 0 Enjoyment of life 0 What TIME of day is your pain at its worst? Morning daytime evening  night Sleep (in general) Fair  Pain is worse with: walking  Pain improves with: medication Relief from Meds: 5     Family History  Problem Relation Age of Onset   Hypertension Mother    Diabetes Mother    Heart disease Mother    Hypertension Father    Diabetes Father    Heart disease Father    Depression Maternal Grandmother    Suicidality Maternal Grandmother    Stroke Maternal Grandfather    Schizophrenia Paternal Grandmother    Other Neg Hx    Breast cancer Neg Hx    Social History   Socioeconomic History   Marital status: Significant Other    Spouse name: Not on file   Number of children: Not on file   Years of education: Not on file   Highest education level: Some college, no degree  Occupational History   Not on file  Tobacco Use   Smoking status: Former    Current packs/day: 0.00    Average packs/day: 0.5 packs/day for 20.0 years (10.0 ttl pk-yrs)    Types: Cigarettes    Start date: 07/01/2001    Quit date: 07/01/2021    Years since quitting: 2.8   Smokeless tobacco: Never   Tobacco comments:    1. Centrix to stop smoking   Vaping Use   Vaping status: Never Used  Substance and Sexual Activity   Alcohol use: Not  Currently    Comment: denies recently; history of excessive use   Drug use: No   Sexual activity: Yes    Partners: Female, Female    Birth control/protection: None  Other Topics Concern   Not on file  Social History Narrative   Not on file   Social Drivers of Health   Financial Resource Strain: High Risk (12/22/2023)   Overall Financial Resource Strain (CARDIA)    Difficulty of Paying Living Expenses: Hard  Food Insecurity: Food Insecurity Present (12/29/2023)   Hunger Vital Sign    Worried About Running Out of Food in the Last Year: Never true    Ran Out of Food in the Last Year: Sometimes true  Transportation Needs: No Transportation Needs (12/29/2023)   PRAPARE - Administrator, Civil Service (Medical): No    Lack of  Transportation (Non-Medical): No  Physical Activity: Insufficiently Active (12/22/2023)   Exercise Vital Sign    Days of Exercise per Week: 3 days    Minutes of Exercise per Session: 30 min  Stress: Stress Concern Present (12/29/2023)   [employer] of Occupational Health - Occupational Stress Questionnaire    Feeling of Stress : To some extent  Social Connections: Moderately Integrated (12/22/2023)   Social Connection and Isolation Panel [NHANES]    Frequency of Communication with Friends and Family: More than three times a week    Frequency of Social Gatherings with Friends and Family: More than three times a week    Attends Religious Services: More than 4 times per year    Active Member of Clubs or Organizations: Yes    Attends Banker Meetings: More than 4 times per year    Marital Status: Divorced   Past Surgical History:  Procedure Laterality Date   ABDOMINAL AORTOGRAM W/LOWER EXTREMITY N/A 06/17/2021   Procedure: ABDOMINAL AORTOGRAM W/LOWER EXTREMITY;  Surgeon: Wenona Hamilton, MD;  Location: [facility];  Service: Cardiovascular;  Laterality: N/A;   AMPUTATION Left 06/27/2021   Procedure: AMPUTATION BELOW KNEE;  Surgeon: Timothy Ford, MD;  Location: [facility] OR;  Service: Orthopedics;  Laterality: Left;   AMPUTATION Right 09/04/2021   Procedure: RIGHT GREAT TOE AMPUTATION;  Surgeon: Timothy Ford, MD;  Location: [facility] OR;  Service: Orthopedics;  Laterality: Right;   CESAREAN SECTION  1991   LEFT HEART CATH AND CORONARY ANGIOGRAPHY N/A 02/10/2020   Procedure: LEFT HEART CATH AND CORONARY ANGIOGRAPHY;  Surgeon: Millicent Ally, MD;  Location: [facility];  Service: Cardiovascular;  Laterality: N/A;   PACEMAKER IMPLANT N/A 02/13/2020   Procedure: PACEMAKER IMPLANT;  Surgeon: Lei Pump, MD;  Location: [facility];  Service: Cardiovascular;  Laterality: N/A;   TEMPORARY PACEMAKER N/A 02/10/2020   Procedure: TEMPORARY PACEMAKER;  Surgeon: Millicent Ally, MD;  [facility]: [facility] INV[facility] CV LAB;  Service: Cardiovascular;  Laterality: N/A;   Past Medical History:  Diagnosis Date   Abnormal Pap smear 1998   Abnormal vaginal bleeding 12/19/2011   Acute urinary retention 06/26/2021   Arthritis    Bacterial infection    Bipolar 1 disorder (HCC)    Blister of second toe of left foot 11/21/2016   Candida vaginitis 07/2007   Depression    recently added wellbutrin-has not taken yet for bipolar   Diabetes in pregnancy    Diabetes mellitus    nph 20U qam and qpm, regular with meals   Diabetic ketoacidosis without coma associated with type 2 diabetes mellitus (  HCC)    Fibroid    Galactorrhea of right breast 2008   GERD (gastroesophageal reflux disease)    H/O amenorrhea 07/2007   H/O dizziness 10/14/2011   H/O dysmenorrhea 2010   H/O menorrhagia 10/14/2011   H/O varicella    Headache(784.0)    Heavy vaginal bleeding due to contraceptive injection use 10/12/2011   Depo Provera    Herpes    Homelessness 11/19/2021   HSV-2 infection 01/03/2009   Hx: UTI (urinary tract infection) 2009   Hypertension    on aldomet   Hypoalbuminemia due to protein-calorie malnutrition (HCC) 06/26/2021   Increased BMI 2010   Irregular uterine bleeding 04/04/2012   Pt has mirena    Obesity 10/14/2011   Oligomenorrhea 07/2007   Osteomyelitis of great toe of right foot (HCC) 03/22/2022   Pelvic pain in female    Presence of permanent cardiac pacemaker    Preterm labor    PTSD (post-traumatic stress disorder)    Trichomonas    Yeast infection    BP (!) 161/89   Pulse 87   Ht 5\' 4"  (1.626 m)   Wt 239 lb (108.4 kg)   LMP 03/25/2024 (Approximate)   SpO2 99%   BMI 41.02 kg/m   Opioid Risk Score:   Fall Risk Score:  `1  Depression screen [facility] 2/9     12/22/2023   11:34 AM 08/16/2023    8:55 AM 07/22/2023   10:09 AM 06/28/2023    9:22 AM 05/13/2023   12:54 PM 04/27/2023    8:51 AM 03/15/2023    9:30 AM  Depression screen PHQ 2/9  Decreased  Interest 0 0 0 0  3 2  Down, Depressed, Hopeless 1 0 0 0  3 1  PHQ - 2 Score 1 0 0 0  6 3  Altered sleeping  0  3  3 3   Tired, decreased energy  2  3  3 1   Change in appetite  1  0  3 0  Feeling bad or failure about yourself   0  0  2 0  Trouble concentrating  0  1  2 0  Moving slowly or fidgety/restless  0  0  0 0  Suicidal thoughts  0  0  0 0  PHQ-9 Score  3  7  19 7   Difficult doing work/chores            Information is confidential and restricted. Go to Review Flowsheets to unlock data.      Review of Systems  Constitutional: Negative.   HENT: Negative.    Eyes: Negative.   Respiratory: Negative.    Cardiovascular: Negative.   Gastrointestinal: Negative.   Endocrine: Negative.   Genitourinary: Negative.   Musculoskeletal:        Pain in left leg  Skin: Negative.   Allergic/Immunologic: Negative.   Neurological:  Positive for weakness and numbness.  Hematological: Negative.   Psychiatric/Behavioral:  Positive for dysphoric mood. The patient is nervous/anxious.   All other systems reviewed and are negative.      Objective:   Physical Exam Gen: no distress, normal appearing, weight 239 lbs HEENT: oral mucosa pink and moist, NCAT Cardio: Reg rate Chest: normal effort, normal rate of breathing Abd: soft, non-distended Ext: no edema Psych: pleasant, normal affect Skin: intact Musculoskeletal: Left BKA healed well, ambulating with prosthetic well. Amputated digits of right foot    Assessment & Plan:  Mrs. Wedel is a 53 year old woman who presents for  hospital follow-up after CIR admission for left BKA  1) L BKA with phantom limb pain -continue follow-up with Dr. Julio Ohm, healing well D/c gabapentin , continue Zynex  Prescribing Home Zynex NexWave Stimulator Device and supplies as needed. IFC, NMES and TENS medically necessary Treatment Rx: Daily @ 30-40 minutes per treatment PRN. Zynex NexWave only, no substitutions. Treatment Goals: 1) To reduce and/or eliminate  pain 2) To improve functional capacity and Activities of daily living 3) To reduce or prevent the need for oral medications 4) To improve circulation in the injured region 5) To decrease or prevent muscle spasm and muscle atrophy 6) To provide a self-management tool to the patient The patient has not sufficiently improved with conservative care. Numerous studies indexed by Medline and PubMed.gov have shown Neuromuscular, Interferential, and TENS stimulators to reduce pain, improve function, and reduce medication use in injured patients. Continued use of this evidence based, safe, drug free treatment is both reasonable and medically necessary at this time.   -Discussed Qutenza  as an option for neuropathic pain control. Discussed that this is a capsaicin  patch, stronger than capsaicin  cream. Discussed that it is currently approved for diabetic peripheral neuropathy and post-herpetic neuralgia, but that it has also shown benefit in treating other forms of neuropathy. Provided patient with link to site to learn more about the patch: https://www.qutenza .com/. Discussed that the patch would be placed in office and benefits usually last 3 months. Discussed that unintended exposure to capsaicin  can cause severe irritation of eyes, mucous membranes, respiratory tract, and skin, but that Qutenza  is a local treatment and does not have the systemic side effects of other nerve medications. Discussed that there may be pain, itching, erythema, and decreased sensory function associated with the application of Qutenza . Side effects usually subside within 1 week. A cold pack of analgesic medications can help with these side effects. Blood pressure can also be increased due to pain associated with administration of the patch.  -will complete social security forms, asked that these be resent -discussed her current challenges at work and that she is currently working three hours per day. Discussed her difficulty doing stairs,  shortness of breath -Provided with a pain relief journal and discussed that it contains foods and lifestyle tips to naturally help to improve pain. Discussed that these lifestyle strategies are also very good for health unlike some medications which can have negative side effects. Discussed that the act of keeping a journal can be therapeutic and helpful to realize patterns what helps to trigger and alleviate pain.   Prescribed Zynex Nexwave -discussed that she cannot take ibuprofen  due to her heart disease -discussed that she can try increasing tylenol  to 1,000mg  TID  2) Type 2 DM with peripheral neuropathy- improved -referred to PT for mirror therapy -improved, d/c qutenza .  -will look into medically tailored meals for her.  -Recommended 1 glass water  with 1 TB apple cider vinegar before meals to reduce CBG spike, has additional health benefits, drink with straw to protect enamel.  -discussed her difficulty getting food from her family.  Discussed that honey has been shown to help diabetes, but that it does have fructose in it.    2) Anemia -discussed what it means to be anemic -discussed sources of iron  rih fods.   3)Constipation:  -Provided list of following foods that help with constipation and highlighted a few: 1) prunes- contain high amounts of fiber.  2) apples- has a form of dietary fiber called pectin that accelerates stool movement and increases beneficial  gut bacteria 3) pears- in addition to fiber, also high in fructose and sorbitol which have laxative effect 4) figs- contain an enzyme ficin which helps to speed colonic transit 5) kiwis- contain an enzyme actinidin that improves gut motility and reduces constipation 6) oranges- rich in pectin (like apples) 7) grapefruits- contain a flavanol naringenin which has a laxative effect 8) vegetables- rich in fiber and also great sources of folate, vitamin C, and K 9) artichoke- high in inulin, prebiotic great for the  microbiome 10) chicory- increases stool frequency and softness (can be added to coffee) 11) rhubarb- laxative effect 12) sweet potato- high fiber 13) beans, peas, and lentils- contain both soluble and insoluble fiber 14) chia seeds- improves intestinal health and gut flora 15) flaxseeds- laxative effect 16) whole grain rye bread- high in fiber 17) oat bran- high in soluble and insoluble fiber 18) kefir- softens stools -recommended to try at least one of these foods every day.  -drink 6-8 glasses of water  per day -walk regularly, especially after meals.   4) HTN: -discussed that BP is now well controlled with medication! -recommending hibiscus tea BID -continue Coreg  -Advised checking BP daily at home and logging results to bring into follow-up appointment with PCP and myself. -Reviewed BP meds today.  -Advised regarding healthy foods that can help lower blood pressure and provided with a list: 1) citrus foods- high in vitamins and minerals 2) salmon and other fatty fish - reduces inflammation and oxylipins 3) swiss chard (leafy green)- high level of nitrates 4) pumpkin seeds- one of the best natural sources of magnesium  5) Beans and lentils- high in fiber, magnesium , and potassium 6) Berries- high in flavonoids 7) Amaranth (whole grain, can be cooked similarly to rice and oats)- high in magnesium  and fiber 8) Pistachios- even more effective at reducing BP than other nuts 9) Carrots- high in phenolic compounds that relax blood vessels and reduce inflammation 10) Celery- contain phthalides that relax tissues of arterial walls 11) Tomatoes- can also improve cholesterol and reduce risk of heart disease 12) Broccoli- good source of magnesium , calcium , and potassium 13) Greek yogurt: high in potassium and calcium  14) Herbs and spices: Celery seed, cilantro, saffron, lemongrass, black cumin, ginseng, cinnamon, cardamom, sweet basil, and ginger 15) Chia and flax seeds- also help to lower  cholesterol and blood sugar 16) Beets- high levels of nitrates that relax blood vessels  17) spinach and bananas- high in potassium  -Provided lise of supplements that can help with hypertension:  1) magnesium : one high quality brand is Bioptemizers since it contains all 7 types of magnesium , otherwise over the counter magnesium  gluconate 400mg  is a good option 2) B vitamins 3) vitamin D  4) potassium 5) CoQ10 6) L-arginine 7) Vitamin C 8) Beetroot -Educated that goal BP is 120/80. -Made goal to incorporate some of the above foods into diet.    5) Allergies -recommended bee pollen   6) Obesity: -discussed weight increase -recommended whole foods plant based diet -Educated regarding health benefits of weight loss- for pain, general health, chronic disease prevention, immune health, mental health.  -Will monitor weight every visit.  -Consider Roobois tea daily.  -Discussed the benefits of intermittent fasting. -Discussed foods that can assist in weight loss: 1) leafy greens- high in fiber and nutrients 2) dark chocolate- improves metabolism (if prefer sweetened, best to sweeten with honey instead of sugar).  3) cruciferous vegetables- high in fiber and protein 4) full fat yogurt: high in healthy fat, protein, calcium , and probiotics  5) apples- high in a variety of phytochemicals 6) nuts- high in fiber and protein that increase feelings of fullness 7) grapefruit: rich in nutrients, antioxidants, and fiber (not to be taken with anticoagulation) 8) beans- high in protein and fiber 9) salmon- has high quality protein and healthy fats 10) green tea- rich in polyphenols 11) eggs- rich in choline and vitamin D  12) tuna- high protein, boosts metabolism 13) avocado- decreases visceral abdominal fat 14) chicken (pasture raised): high in protein and iron  15) blueberries- reduce abdominal fat and cholesterol 16) whole grains- decreases calories retained during digestion, speeds  metabolism 17) chia seeds- curb appetite 18) chilies- increases fat metabolism  -Discussed supplements that can be used:  1) Metatrim 400mg  BID 30 minutes before breakfast and dinner  2) Sphaeranthus indicus and Garcinia mangostana (combinations of these and #1 can be found in capsicum and zychrome  3) green coffee bean extract 400mg  twice per day or Irvingia (african mango) 150 to 300mg  twice per day.  7) Bilateral lower extremity edema 2/2 CHF: -continue Farxiga  and Lasix  -discussed that she has been cleared my Dr. McClain to use [facility] -discussed whole foods plant based diet  8) Premenstrual pain: -discussed using saffron daily, discussed that she can put a small thready in what she is eating every day -discussed that she is hopeful for menopause soon  9) Morbid obesity: -discussed that she eats a lot of fast food currently -discussed that she has a girlfriend that does charcuterie  -continue tylenol  and ibuprofen  prn -discussed that she eats what she wants to eat but watches her portions -discussed that she feels that she is out of breath during sex and when walking and she feels this may be related to her weight -Discussed the benefits of intermittent fasting. Recommended starting with pushing dinner 15 minutes earlier and when this feels easy, continuing to push dinner 15 minutes earlier. Discussed that this can help her body to improve its ability to burn fat rather than glucose, improving insulin  sensitivity. Recommended drinking Roobois tea in the evening to help curb appetite and for its numerous health benefits. -discussed that she works 8pm to 8am -discussed topamax -discussed that she has a history of pancreatitis

## 2024-05-03 ENCOUNTER — Telehealth: Payer: Self-pay

## 2024-05-03 DIAGNOSIS — Z794 Long term (current) use of insulin: Secondary | ICD-10-CM

## 2024-05-03 NOTE — Progress Notes (Signed)
 Complex Care Management Note Care Guide Note  05/03/2024 Name: Sarah Long MRN: [id number] DOB: November 13, 1971   Complex Care Management Outreach Attempts: An unsuccessful telephone outreach was attempted today to offer the patient information about available complex care management services.  Follow Up Plan:  Additional outreach attempts will be made to offer the patient complex care management information and services.   Encounter Outcome:  No Answer  Creola Doheny [facility], [facility] Guide  Direct Dial : [phone]  Fax [phone]

## 2024-05-04 NOTE — Progress Notes (Signed)
 Complex Care Management Note Care Guide Note  05/04/2024 Name: Sarah Long MRN: [id number] DOB: June 26, 1971   Complex Care Management Outreach Attempts: A second unsuccessful outreach was attempted today to offer the patient with information about available complex care management services.  Follow Up Plan:  Additional outreach attempts will be made to offer the patient complex care management information and services.   Encounter Outcome:  No Answer  Kandis Ormond, CMA [place]  [facility], [facility] Guide Direct Dial : [phone]  Fax: [phone] Website: [email or web address]

## 2024-05-07 ENCOUNTER — Telehealth: Payer: Self-pay

## 2024-05-07 NOTE — Progress Notes (Unsigned)
 Complex Care Management Note Care Guide Note  05/07/2024 Name: Sarah Long MRN: [id number] DOB: 16-May-1971   Complex Care Management Outreach Attempts: A third unsuccessful outreach was attempted today to offer the patient with information about available complex care management services.  Follow Up Plan:  Additional outreach attempts will be made to offer the patient complex care management information and services.   Encounter Outcome:  No Answer  Creola Doheny [facility], [facility] Guide  Direct Dial : [phone]  Fax [phone]

## 2024-05-07 NOTE — Progress Notes (Unsigned)
 Complex Care Management Note Care Guide Note  05/07/2024 Name: Sarah Long MRN: [id number] DOB: 1971/03/06   Complex Care Management Outreach Attempts: An unsuccessful telephone outreach was attempted today to offer the patient information about available complex care management services.  Follow Up Plan:  No further outreach attempts will be made at this time. We have been unable to contact the patient to offer or enroll patient in complex care management services.  Encounter Outcome:  No Answer  Creola Doheny [facility], [facility] Guide  Direct Dial : [phone]  Fax [phone]

## 2024-05-08 DIAGNOSIS — F1020 Alcohol dependence, uncomplicated: Secondary | ICD-10-CM | POA: Diagnosis not present

## 2024-05-08 NOTE — Progress Notes (Signed)
 Complex Care Management Note Care Guide Note  05/08/2024 Name: FINESSE FIELDER MRN: [id number] DOB: 1970-12-28   Complex Care Management Outreach Attempts: A third unsuccessful outreach was attempted today to offer the patient with information about available complex care management services.  Follow Up Plan:  Additional outreach attempts will be made to offer the patient complex care management information and services.   Encounter Outcome:  No Answer  Creola Doheny [facility], [facility] Guide  Direct Dial : [phone]  Fax [phone]

## 2024-05-09 DIAGNOSIS — F1020 Alcohol dependence, uncomplicated: Secondary | ICD-10-CM | POA: Diagnosis not present

## 2024-05-10 NOTE — Progress Notes (Signed)
 Complex Care Management Note  Care Guide Note 05/10/2024 Name: Sarah Long MRN: [id number] DOB: 12/30/1970  Sarah Long is a 53 y.o. year old female who sees Newlin, Enobong, MD for primary care. I reached out to Sarah Long by phone today to offer complex care management services.  [name] was given information about Complex Care Management services today including:   The Complex Care Management services include support from the care team which includes your Nurse Care Manager, Clinical Social Worker, or Pharmacist.  The Complex Care Management team is here to help remove barriers to the health concerns and goals most important to you. Complex Care Management services are voluntary, and the patient may decline or stop services at any time by request to their care team member.   Complex Care Management Consent Status: Patient agreed to services and verbal consent obtained.   Follow up plan:  Telephone appointment with complex care management team member scheduled for:  05-17-24  Encounter Outcome:  Patient Scheduled   Creola Doheny [facility], [facility] Guide  Direct Dial : [phone]  Fax [phone]

## 2024-05-17 ENCOUNTER — Other Ambulatory Visit: Payer: Self-pay

## 2024-05-17 NOTE — Patient Instructions (Signed)
 Visit Information  Ms. Dykes was given information about Medicaid Managed Care team care coordination services as a part of their [employer] Medicaid benefit. [name] verbally consentedto engagement with the [facility] team.   If you are experiencing a medical emergency, please call 911 or report to your local emergency department or urgent care.   If you have a non-emergency medical problem during routine business hours, please contact your provider's office and ask to speak with a nurse.   For questions related to your [facility] [facility] health plan, please call: [phone]  OR visit the member homepage at: [email or web address]  If you would like to schedule transportation through your [facility] plan, please call the following number at least 2 days in advance of your appointment: [phone]  If you are experiencing a behavioral health crisis, call the [employer] [facility]  Behavioral Health Crisis Line at [phone]). The line is available 24 hours a day, seven days a week.  If you would like help to quit smoking, call 1-800-QUIT-NOW (716-650-9404) OR Espaol: 1-855-Djelo-Ya (4-403-474-2595) o para ms informacin haga clic aqu or Text READY to 638-756 to register via text   Ms. Virgia Griffins - following are the goals we discussed in your visit today:    Goals Addressed             This Visit's Progress    VBCI RN Care Plan related to DM   On track    Problems:  Chronic Disease Management support and education needs related to DMII  Goal: Over the next 30 days the Patient will demonstrate Improved adherence to prescribed treatment plan for DMII as evidenced by improved blood sugar readings towards goal (last A1c 10.2) take all medications exactly as prescribed and will call provider for medication related questions as evidenced by patient report of  medication adherence    Schedule visit with endocrinology for follow-up and A1c recheck  Interventions:   Diabetes Interventions: Assessed patient's understanding of A1c goal: <7% Reviewed medications with patient and discussed importance of medication adherence Counseled on importance of regular laboratory monitoring as prescribed Discussed plans with patient for ongoing care management follow up and provided patient with direct contact information for care management team Provided patient with written educational materials related to hypo and hyperglycemia and importance of correct treatment Advised patient, providing education and rationale, to check cbg as advised by provider and record, calling endocrinology or PCP for findings outside established parameters Ensured patient has endocrinology office phone number Lab Results  Component Value Date   HGBA1C 10.2 (A) 11/25/2023    Evaluation of current treatment plan related to chronic illness,  self-management and patient's adherence to plan as established by provider. Discussed plans with patient for ongoing care management follow up and provided patient with direct contact information for care management team Reviewed scheduled/upcoming provider appointments including PCP 05/23/24 Message sent to PCP requesting order for scale and OBGYN referral per patient request  Patient Self-Care Activities:  Attend all scheduled provider appointments Call provider office for new concerns or questions  Take medications as prescribed   check blood sugar at prescribed times: as advised by provider check feet daily for cuts, sores or redness take the blood sugar log to all doctor visits drink 6 to 8 glasses of water  each day  Plan:  Telephone follow up appointment with care management team member scheduled for:  06/01/24 at 10 AM  Patient verbalizes understanding of instructions and care plan provided today and agrees to view  in MyChart. Active MyChart status and patient understanding of how to access instructions and care plan via MyChart confirmed with patient.     The Patient                                              will call endocrinology* as advised to request follow-up appointment and A1c.  RN Care Manager will message PCP regarding order for scale and OBGYN referral Telephone follow up appointment with Managed Medicaid care management team member scheduled for:  Theodora Fish, RN MSN [place]  [facility] RN Care Manager Direct Dial : [phone]  Fax: [phone]  Following is a copy of your plan of care:  There are no care plans that you recently modified to display for this patient.

## 2024-05-17 NOTE — Patient Outreach (Signed)
 Complex Care Management   Visit Note  05/17/2024  Name:  Sarah Long MRN: [id number] DOB: January 14, 1971  Situation: Referral received for Complex Care Management related to Diabetes with Complications I obtained verbal consent from Patient.  Visit completed with Sarah Long  on the phone  Background:   Past Medical History:  Diagnosis Date   Abnormal Pap smear 1998   Abnormal vaginal bleeding 12/19/2011   Acute urinary retention 06/26/2021   Arthritis    Bacterial infection    Bipolar 1 disorder (HCC)    Blister of second toe of left foot 11/21/2016   Candida vaginitis 07/2007   Depression    recently added wellbutrin-has not taken yet for bipolar   Diabetes in pregnancy    Diabetes mellitus    nph 20U qam and qpm, regular with meals   Diabetic ketoacidosis without coma associated with type 2 diabetes mellitus (HCC)    Fibroid    Galactorrhea of right breast 2008   GERD (gastroesophageal reflux disease)    H/O amenorrhea 07/2007   H/O dizziness 10/14/2011   H/O dysmenorrhea 2010   H/O menorrhagia 10/14/2011   H/O varicella    Headache(784.0)    Heavy vaginal bleeding due to contraceptive injection use 10/12/2011   Depo Provera    Herpes    Homelessness 11/19/2021   HSV-2 infection 01/03/2009   Hx: UTI (urinary tract infection) 2009   Hypertension    on aldomet   Hypoalbuminemia due to protein-calorie malnutrition (HCC) 06/26/2021   Increased BMI 2010   Irregular uterine bleeding 04/04/2012   Pt has mirena    Obesity 10/14/2011   Oligomenorrhea 07/2007   Osteomyelitis of great toe of right foot (HCC) 03/22/2022   Pelvic pain in female    Presence of permanent cardiac pacemaker    Preterm labor    PTSD (post-traumatic stress disorder)    Trichomonas    Yeast infection     Assessment: Patient Reported Symptoms:  Cognitive Cognitive Status: Able to follow simple commands, Alert and oriented to person, place, and time, Normal speech and language  skills Cognitive/Intellectual Conditions Management [RPT]: None reported or documented in medical history or problem list      Neurological Neurological Review of Symptoms: Numbness (Numbness R foot d/t neuropathy) Neurological Management Strategies: Routine screening, Medication therapy  HEENT HEENT Symptoms Reported: No symptoms reported HEENT Conditions: Vision problem(s) Vision Problems: blindness/vision loss (Wear eyeglasses) HEENT Management Strategies: Routine screening Vision problem(s)  Cardiovascular Cardiovascular Symptoms Reported: Chest pain or discomfort Does patient have uncontrolled Hypertension?: Yes Is patient checking Blood Pressure at home?: Yes Cardiovascular Conditions: Heart failure, Hypertension, Valvular disease, High blood cholesterol Cardiovascular Management Strategies: Medication therapy, Medical device, Weight management (Pacemaker) Do You Have a Working Readable Scale?: No Weight: 239 lb (108.4 kg) (this morning at son's MD appointment) Cardiovascular Comment: Patient reports she does not have a working readable scale. Message sent to PCP for order.  Respiratory Respiratory Symptoms Reported: Shortness of breath, Productive cough (SOB both at rest and with activity; yellow mucus production) Additional Respiratory Details: Patient using PRN Albuterol  1-2 times a week Respiratory Comment: Patient reports she is in the process of being tested for sleep apnea and trying to get PFTs done  Endocrine Patient reports the following symptoms related to hypoglycemia or hyperglycemia : Increased urination Is patient diabetic?: Yes Is patient checking blood sugars at home?: Yes Endocrine Conditions: Diabetes Endocrine Management Strategies: Medication therapy, Medical device Endocrine Comment: Patient reports that she having issues with her Omnipod (  insulin  pump) not sticking well due to sweating during the summer. Patient sees endocrinology (Dr. Rosalea Collin). Per chart  review last visit 11/2023, appears she is due for another visit and A1c. Advised that patient contact her office to request a follow-up visit.  Gastrointestinal Gastrointestinal Symptoms Reported: Constipation (Last BM 2 days ago) Gastrointestinal Conditions: Constipation Gastrointestinal Management Strategies: Diet modification, Exercise Gastrointestinal Comment: Reviewed constipation treatment Nutrition Risk Screen (CP): No indicators present  Genitourinary Genitourinary Symptoms Reported: Urgency Genitourinary Conditions: Incontinence (Episodes of stress incontinence when she coughs) Genitourinary Management Strategies: Coping strategies  Integumentary Integumentary Symptoms Reported: No symptoms reported    Musculoskeletal Musculoskelatal Symptoms Reviewed: Unsteady gait Musculoskeletal Conditions: Amputation Musculoskeletal Management Strategies: Medical device, Adequate rest, Activity Falls in the past year?: Yes Number of falls in past year: 2 or more Was there an injury with Fall?: No Fall Risk Category Calculator: 2 Patient Fall Risk Level: Moderate Fall Risk Patient at Risk for Falls Due to: History of fall(s), Impaired balance/gait, Impaired mobility, Other (Comment) (L BKA, R great toe amputation) Fall risk Follow up: Falls evaluation completed, Education provided, Falls prevention discussed  Psychosocial Psychosocial Symptoms Reported: No symptoms reported Behavioral Health Conditions: Anxiety Behavioral Health Comment: Patient goes to [facility] and has a counselor   Quality of Family Relationships: helpful, involved, supportive Do you feel physically threatened by others?: No      05/17/2024    4:09 PM  Depression screen PHQ 2/9  Decreased Interest 0  Down, Depressed, Hopeless 0  PHQ - 2 Score 0    There were no vitals filed for this visit.  Medications Reviewed Today     Reviewed by Valaria Garland, RN (Registered Nurse) on 05/17/24 at 1614  Med List Status:  <None>   Medication Order Taking? Sig Documenting Provider Last Dose Status Informant  albuterol  (VENTOLIN  HFA) 108 (90 Base) MCG/ACT inhaler [id number] Yes Inhale 2 puffs into the lungs every 6 (six) hours as needed for wheezing or shortness of breath. Newlin, Enobong, MD  Active   ARIPiprazole  (ABILIFY ) 15 MG tablet [id number] Yes Take 1 tablet (15 mg total) by mouth daily. Bahraini, Genevive Ket  Active   atorvastatin  (LIPITOR ) 20 MG tablet [id number] Yes Take 1 tablet (20 mg total) by mouth daily. Vernell Goldsmith, MD  Active   Blood Pressure Monitoring (BLOOD PRESSURE CUFF) MISC [id number] Yes Please monitor and check blood pressure daily please call the clinic if blood pressure is over 140 [place], [place]  Active   CARESTART COVID-19 HOME TEST KIT [id number]  See admin instructions. [provider]  Active   carvedilol  (COREG ) 12.5 MG tablet [id number] Yes Take 1 tablet (12.5 mg total) by mouth 2 (two) times daily with a meal. Vernell Goldsmith, MD  Active   Continuous Glucose Sensor (DEXCOM G7 SENSOR) MISC [id number] Yes 1 Device by Does not apply route as directed. Shamleffer, Ibtehal Jaralla, MD  Active   dapagliflozin  propanediol (FARXIGA ) 10 MG TABS tablet [id number]  Take 1 tablet (10 mg total) by mouth daily before breakfast.  Patient not taking: Reported on 05/17/2024   Elmarie Hacking, FNP  Active   furosemide  (LASIX ) 20 MG tablet [id number] Yes Take 100 mg by mouth daily. [provider]  Active   GALAFOLD 123 MG CAPS [id number] Yes Take by mouth. [provider]  Active   hydrALAZINE  (APRESOLINE ) 50 MG tablet [id number] Yes Take one three times daily Vernell Goldsmith, MD  Active   hydrOXYzine  (ATARAX ) 25 MG  tablet [id number] Yes Take 1 tablet (25 mg total) by mouth daily as needed for anxiety (or sleep). Bahraini, Sarah A  Active   Insulin  Disposable Pump (OMNIPOD 5 DEXG7G6 PODS GEN 5) MISC [id number] Yes SMARTSIG:SUB-Q Every Other Day [provider]   Active   Insulin  Disposable Pump (OMNIPOD 5 G7 INTRO, GEN 5,) KIT [id number] Yes 1 Device by Does not apply route every other day. Shamleffer, Ibtehal Jaralla, MD  Active   insulin  glargine (LANTUS  SOLOSTAR) 100 UNIT/ML Solostar Pen [id number] Yes Inject 60 Units into the skin daily. Shamleffer, Ibtehal Jaralla, MD  Active   insulin  lispro (HUMALOG ) 100 UNIT/ML injection [id number] Yes MAX DAILY 125 UNITS A DAY PER PUMP Newlin, Enobong, MD  Active   insulin  lispro (HUMALOG ) 100 UNIT/ML KwikPen [id number] Yes Inject 14 Units into the skin. Before meals daily [provider]  Active   Insulin  Pen Needle 31G X 5 MM MISC [id number] Yes Use as directed in the morning, at noon, in the evening, and at bedtime. Shamleffer, Ibtehal Jaralla, MD  Active   isosorbide  mononitrate (ISMO ) 20 MG tablet [id number] Yes Take 1 tablet (20 mg total) by mouth daily. Vernell Goldsmith, MD  Active     Discontinued 04/27/23 1048 (Change in therapy)            Med Note>> Procita, April M, [facility]   04/27/2023 10:48 AM waiting on cardiologist recommendation 04/27/23 pt currently in hyperkalemia    valsartan  (DIOVAN ) 320 MG tablet [id number]  Take 1 tablet (320 mg total) by mouth daily. Vernell Goldsmith, MD  Active   varenicline  (CHANTIX  CONTINUING MONTH PAK) 1 MG tablet [id number]  Take 1 tablet (1 mg total) by mouth 2 (two) times daily. Vernell Goldsmith, MD  Active             Recommendation:   PCP Follow-up DME requests:  other scale Continue Current Plan of Care Referral request: OBGYN per patient request  Follow Up Plan:   Telephone follow up appointment date/time:  06/01/24 at 10 AM  Theodora Fish, RN MSN [place]  [facility] RN Care Manager Direct Dial : [phone]  Fax: [phone]

## 2024-05-18 ENCOUNTER — Telehealth (HOSPITAL_COMMUNITY): Payer: Self-pay | Admitting: Vascular Surgery

## 2024-05-18 NOTE — Telephone Encounter (Signed)
 Lvm to make f/u w/ Mclean w/ echo

## 2024-05-21 DIAGNOSIS — F1020 Alcohol dependence, uncomplicated: Secondary | ICD-10-CM | POA: Diagnosis not present

## 2024-05-23 ENCOUNTER — Encounter: Payer: Self-pay | Admitting: Family Medicine

## 2024-05-23 ENCOUNTER — Ambulatory Visit: Attending: Family Medicine | Admitting: Family Medicine

## 2024-05-23 VITALS — BP 136/80 | HR 89 | Ht 64.0 in | Wt 236.0 lb

## 2024-05-23 DIAGNOSIS — E1142 Type 2 diabetes mellitus with diabetic polyneuropathy: Secondary | ICD-10-CM | POA: Diagnosis not present

## 2024-05-23 DIAGNOSIS — I129 Hypertensive chronic kidney disease with stage 1 through stage 4 chronic kidney disease, or unspecified chronic kidney disease: Secondary | ICD-10-CM | POA: Diagnosis not present

## 2024-05-23 DIAGNOSIS — Z7984 Long term (current) use of oral hypoglycemic drugs: Secondary | ICD-10-CM | POA: Diagnosis not present

## 2024-05-23 DIAGNOSIS — L304 Erythema intertrigo: Secondary | ICD-10-CM | POA: Diagnosis not present

## 2024-05-23 DIAGNOSIS — Z23 Encounter for immunization: Secondary | ICD-10-CM

## 2024-05-23 DIAGNOSIS — E1122 Type 2 diabetes mellitus with diabetic chronic kidney disease: Secondary | ICD-10-CM | POA: Diagnosis not present

## 2024-05-23 DIAGNOSIS — Z87891 Personal history of nicotine dependence: Secondary | ICD-10-CM

## 2024-05-23 DIAGNOSIS — I110 Hypertensive heart disease with heart failure: Secondary | ICD-10-CM | POA: Diagnosis not present

## 2024-05-23 DIAGNOSIS — Z91148 Patient's other noncompliance with medication regimen for other reason: Secondary | ICD-10-CM | POA: Diagnosis not present

## 2024-05-23 DIAGNOSIS — Z89512 Acquired absence of left leg below knee: Secondary | ICD-10-CM

## 2024-05-23 DIAGNOSIS — F319 Bipolar disorder, unspecified: Secondary | ICD-10-CM

## 2024-05-23 DIAGNOSIS — R399 Unspecified symptoms and signs involving the genitourinary system: Secondary | ICD-10-CM | POA: Diagnosis not present

## 2024-05-23 LAB — POCT URINALYSIS DIP (CLINITEK)
Bilirubin, UA: NEGATIVE
Glucose, UA: >=1000 mg/dL — AB
Ketones, POC UA: NEGATIVE mg/dL
Leukocytes, UA: NEGATIVE
Nitrite, UA: NEGATIVE
POC PROTEIN,UA: >=300 — AB
Spec Grav, UA: 1.025 (ref 1.010–1.025)
Urobilinogen, UA: 0.2 U/dL
pH, UA: 5.5 (ref 5.0–8.0)

## 2024-05-23 LAB — POCT GLYCOSYLATED HEMOGLOBIN (HGB A1C): HbA1c, POC (controlled diabetic range): 10 % — AB (ref 0.0–7.0)

## 2024-05-23 MED ORDER — CLOTRIMAZOLE 1 % EX CREA: 1.0000 | TOPICAL_CREAM | Freq: Two times a day (BID) | CUTANEOUS | 1 refills | Status: DC

## 2024-05-23 MED ORDER — VARENICLINE TARTRATE 1 MG PO TABS
1.0000 mg | ORAL_TABLET | Freq: Two times a day (BID) | ORAL | 2 refills | Status: DC
Start: 2024-05-23 — End: 2024-06-26

## 2024-05-23 MED ORDER — NYSTATIN 100000 UNIT/GM EX POWD
1.0000 | Freq: Two times a day (BID) | CUTANEOUS | 1 refills | Status: DC
Start: 2024-05-23 — End: 2024-06-26

## 2024-05-23 NOTE — Patient Instructions (Signed)
 VISIT SUMMARY:  During your visit, we discussed your symptoms of a possible urinary tract infection, your diabetes management, elevated blood pressure, a rash under your breasts, and other ongoing health concerns. We reviewed your current medications and made plans for follow-up appointments and additional tests.  YOUR PLAN:  -URINARY TRACT INFECTION (UTI) SYMPTOMS: You have symptoms that may indicate a urinary tract infection, but it could also be related to your menstruation. We will do a vaginal swab after your period to check for bacterial vaginosis or a yeast infection.  -TYPE 2 DIABETES MELLITUS: Your blood sugar levels are higher than they should be, as indicated by your A1c of 10.7. We need to schedule an appointment with your endocrinologist and get prior authorization for Farxiga  from your cardiologist.  -HYPERTENSION: Your blood pressure has been elevated, likely because you have missed some doses of your medication. It's important to take your medication regularly, and using a pill box might help you remember.  -CHRONIC KIDNEY DISEASE: There has been a slight decline in your kidney function, which we need to monitor. We will order a basic metabolic panel to check your kidney function.  -INTERTRIGO: The rash under your breasts is likely a fungal infection called intertrigo. We will treat it with nystatin  powder and recommend keeping the area dry and well-ventilated. We will also prescribe a cream for the rash on top of your breasts.  -BIPOLAR DISORDER: You are currently taking Abilify  and hydroxyzine  for your bipolar disorder. We discussed the importance of following up with your behavioral health provider to review your medication regimen.  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 We discussed the genetic implications and the importance of ongoing management.  INSTRUCTIONS:  Please schedule an appointment with your  endocrinologist and follow up with cardiology for the Farxiga  authorization. We will also need to do a basic metabolic panel to monitor your kidney function. After your menstruation, return for a vaginal swab to check for bacterial vaginosis or a yeast infection.

## 2024-05-23 NOTE — Progress Notes (Unsigned)
 Subjective:  Patient ID: Sarah Long, female    DOB: 1971-01-16  Age: 53 y.o. MRN: [id number]  CC: Medical Management of Chronic Issues (UTI symptoms/Rash on left breast/Referral to OBGYN)     Discussed the use of AI scribe software for clinical note transcription with the patient, who gave verbal consent to proceed.  History of Present Illness Sarah Long is a 53 year old female with  medical history including type two diabetes mellitus, hypertension, left below knee amputation due to chronic toe wounds with osteomyelitis, complete heart block with pacemaker placement, stage three chronic kidney disease, hyperlipidemia, bipolar disorder and Fabry's disease, who presents with symptoms suggestive of a urinary tract infection.  She experiences urinary frequency and a sensation of pressure before urination. Approximately three weeks ago, she was treated with Keflex , but the urine culture was inconclusive. She is currently menstruating, which she believes might be contributing to her symptoms. There are no leukocytes in urine, but a fishy odor is present, raising concerns about a possible infection.  She has a rash on the top and underneath her breasts, characterized by redness and itchiness, which she attributes to excessive sweating due to a broken air conditioner at home. The rash is described as having a 'burning itch.'  Her blood pressure has been elevated, but she has not been taking her medication consistently due to her work schedule. Home blood pressure readings are typically around 120/60 mmHg.  Her diabetes is managed by an endocrinologist, with her last A1c recorded at 10.7. She uses an OmniPod system with Humalog , receiving two units per hour and bolus doses as needed. She has been unable to obtain Farxiga  due to insurance issues requiring prior authorization.  She experiences phantom pain related to her amputation and uses a Zantac device and saffron for management. She inquires  about the maximum dose of Tylenol  she can take, as she cannot use ibuprofen  due to her heart condition.    Past Medical History:  Diagnosis Date   Abnormal Pap smear 1998   Abnormal vaginal bleeding 12/19/2011   Acute urinary retention 06/26/2021   Arthritis    Bacterial infection    Bipolar 1 disorder (HCC)    Blister of second toe of left foot 11/21/2016   Candida vaginitis 07/2007   Depression    recently added wellbutrin-has not taken yet for bipolar   Diabetes in pregnancy    Diabetes mellitus    nph 20U qam and qpm, regular with meals   Diabetic ketoacidosis without coma associated with type 2 diabetes mellitus (HCC)    Fibroid    Galactorrhea of right breast 2008   GERD (gastroesophageal reflux disease)    H/O amenorrhea 07/2007   H/O dizziness 10/14/2011   H/O dysmenorrhea 2010   H/O menorrhagia 10/14/2011   H/O varicella    Headache(784.0)    Heavy vaginal bleeding due to contraceptive injection use 10/12/2011   Depo Provera    Herpes    Homelessness 11/19/2021   HSV-2 infection 01/03/2009   Hx: UTI (urinary tract infection) 2009   Hypertension    on aldomet   Hypoalbuminemia due to protein-calorie malnutrition (HCC) 06/26/2021   Increased BMI 2010   Irregular uterine bleeding 04/04/2012   Pt has mirena    Obesity 10/14/2011   Oligomenorrhea 07/2007   Osteomyelitis of great toe of right foot (HCC) 03/22/2022   Pelvic pain in female    Presence of permanent cardiac pacemaker    Preterm labor    PTSD (  post-traumatic stress disorder)    Trichomonas    Yeast infection     Past Surgical History:  Procedure Laterality Date   ABDOMINAL AORTOGRAM W/LOWER EXTREMITY N/A 06/17/2021   Procedure: ABDOMINAL AORTOGRAM W/LOWER EXTREMITY;  Surgeon: Wenona Hamilton, MD;  Location: [facility];  Service: Cardiovascular;  Laterality: N/A;   AMPUTATION Left 06/27/2021   Procedure: AMPUTATION BELOW KNEE;  Surgeon: Timothy Ford, MD;  Location: [facility] OR;  Service:  Orthopedics;  Laterality: Left;   AMPUTATION Right 09/04/2021   Procedure: RIGHT GREAT TOE AMPUTATION;  Surgeon: Timothy Ford, MD;  Location: [facility] OR;  Service: Orthopedics;  Laterality: Right;   CESAREAN SECTION  1991   LEFT HEART CATH AND CORONARY ANGIOGRAPHY N/A 02/10/2020   Procedure: LEFT HEART CATH AND CORONARY ANGIOGRAPHY;  Surgeon: Millicent Ally, MD;  Location: [facility];  Service: Cardiovascular;  Laterality: N/A;   PACEMAKER IMPLANT N/A 02/13/2020   Procedure: PACEMAKER IMPLANT;  Surgeon: Lei Pump, MD;  Location: [facility];  Service: Cardiovascular;  Laterality: N/A;   TEMPORARY PACEMAKER N/A 02/10/2020   Procedure: TEMPORARY PACEMAKER;  Surgeon: Millicent Ally, MD;  Location: [facility] INV[facility]IVE [facility];  Service: Cardiovascular;  Laterality: N/A;    Family History  Problem Relation Age of Onset   Hypertension Mother    Diabetes Mother    Heart disease Mother    Hypertension Father    Diabetes Father    Heart disease Father    Depression Maternal Grandmother    Suicidality Maternal Grandmother    Stroke Maternal Grandfather    Schizophrenia Paternal Grandmother    Other Neg Hx    Breast cancer Neg Hx     Social History   Socioeconomic History   Marital status: Significant Other    Spouse name: Not on file   Number of children: Not on file   Years of education: Not on file   Highest education level: Some college, no degree  Occupational History   Not on file  Tobacco Use   Smoking status: Former    Current packs/day: 0.00    Average packs/day: 0.5 packs/day for 20.0 years (10.0 ttl pk-yrs)    Types: Cigarettes    Start date: 07/01/2001    Quit date: 07/01/2021    Years since quitting: 2.8   Smokeless tobacco: Never   Tobacco comments:    1. Centrix to stop smoking   Vaping Use   Vaping status: Never Used  Substance and Sexual Activity   Alcohol use: Not Currently    Comment: denies recently; history of excessive use   Drug use: No    Sexual activity: Yes    Partners: Female, Female    Birth control/protection: None  Other Topics Concern   Not on file  Social History Narrative   Not on file   Social Drivers of Health   Financial Resource Strain: High Risk (12/22/2023)   Overall Financial Resource Strain (CARDIA)    Difficulty of Paying Living Expenses: Hard  Food Insecurity: No Food Insecurity (05/17/2024)   Hunger Vital Sign    Worried About Running Out of Food in the Last Year: Never true    Ran Out of Food in the Last Year: Never true  Transportation Needs: No Transportation Needs (12/29/2023)   PRAPARE - Administrator, Civil Service (Medical): No    Lack of Transportation (Non-Medical): No  Physical Activity: Insufficiently Active (12/22/2023)   Exercise Vital Sign  Days of Exercise per Week: 3 days    Minutes of Exercise per Session: 30 min  Stress: Stress Concern Present (12/29/2023)   [employer] of Occupational Health - Occupational Stress Questionnaire    Feeling of Stress : To some extent  Social Connections: Moderately Integrated (12/22/2023)   Social Connection and Isolation Panel    Frequency of Communication with Friends and Family: More than three times a week    Frequency of Social Gatherings with Friends and Family: More than three times a week    Attends Religious Services: More than 4 times per year    Active Member of [employer] or Organizations: Yes    Attends Engineer, structural: More than 4 times per year    Marital Status: Divorced    Allergies  Allergen Reactions   Lamictal  [Lamotrigine ] Itching   Strawberry Extract Hives   Sulfa Antibiotics Hives and Itching    Outpatient Medications Prior to Visit  Medication Sig Dispense Refill   albuterol  (VENTOLIN  HFA) 108 (90 Base) MCG/ACT inhaler Inhale 2 puffs into the lungs every 6 (six) hours as needed for wheezing or shortness of breath. 8 g 2   ARIPiprazole  (ABILIFY ) 15 MG tablet Take 1 tablet (15 mg total) by  mouth daily. 30 tablet 2   atorvastatin  (LIPITOR ) 20 MG tablet Take 1 tablet (20 mg total) by mouth daily. 90 tablet 3   Blood Pressure Monitoring (BLOOD PRESSURE CUFF) MISC Please monitor and check blood pressure daily please call the clinic if blood pressure is over 140 1 each 0   CARESTART COVID-19 HOME TEST KIT See admin instructions.     carvedilol  (COREG ) 12.5 MG tablet Take 1 tablet (12.5 mg total) by mouth 2 (two) times daily with a meal. 120 tablet 1   Continuous Glucose Sensor (DEXCOM G7 SENSOR) MISC 1 Device by Does not apply route as directed. 9 each 3   dapagliflozin  propanediol (FARXIGA ) 10 MG TABS tablet Take 1 tablet (10 mg total) by mouth daily before breakfast. 30 tablet 11   furosemide  (LASIX ) 20 MG tablet Take 100 mg by mouth daily.     GALAFOLD 123 MG CAPS Take by mouth.     hydrALAZINE  (APRESOLINE ) 50 MG tablet Take one three times daily 180 tablet 1   Insulin  Disposable Pump (OMNIPOD 5 DEXG7G6 PODS GEN 5) MISC SMARTSIG:SUB-Q Every Other Day     Insulin  Disposable Pump (OMNIPOD 5 G7 INTRO, GEN 5,) KIT 1 Device by Does not apply route every other day. 1 kit 0   insulin  glargine (LANTUS  SOLOSTAR) 100 UNIT/ML Solostar Pen Inject 60 Units into the skin daily. 60 mL 3   insulin  lispro (HUMALOG ) 100 UNIT/ML injection MAX DAILY 125 UNITS A DAY PER PUMP 40 mL 2   insulin  lispro (HUMALOG ) 100 UNIT/ML KwikPen Inject 14 Units into the skin. Before meals daily     Insulin  Pen Needle 31G X 5 MM MISC Use as directed in the morning, at noon, in the evening, and at bedtime. 400 each 3   isosorbide  mononitrate (ISMO ) 20 MG tablet Take 1 tablet (20 mg total) by mouth daily. 90 tablet 3   valsartan  (DIOVAN ) 320 MG tablet Take 1 tablet (320 mg total) by mouth daily. 90 tablet 3   varenicline  (CHANTIX  CONTINUING MONTH PAK) 1 MG tablet Take 1 tablet (1 mg total) by mouth 2 (two) times daily. 60 tablet 1   No facility-administered medications prior to visit.     ROS Review of  Systems ***  Objective:  BP (!) 154/84   Pulse 89   Ht 5' 4 (1.626 m)   Wt 236 lb (107 kg)   SpO2 99%   BMI 40.51 kg/m      05/23/2024   10:02 AM 05/17/2024    4:01 PM 05/01/2024    9:58 AM  BP/Weight  Systolic BP 154  161  Diastolic BP 84  89  Wt. (Lbs) 236 239 239  BMI 40.51 kg/m2 41.02 kg/m2 41.02 kg/m2      Physical Exam  Skin:    Comments: Erythematous rash on superficial aspect of right breast No rash in inframammary region bilaterally    ***    Latest Ref Rng & Units 03/12/2024    8:57 AM 01/06/2024    9:37 PM 12/22/2023   11:29 AM  CMP  Glucose 70 - 99 mg/dL 096  045    BUN 6 - 20 mg/dL 32  18    Creatinine 4.09 - 1.00 mg/dL 8.11  9.14    Sodium 782 - 145 mmol/L 135  136    Potassium 3.5 - 5.1 mmol/L 4.6  4.2    Chloride 98 - 111 mmol/L 103  107    CO2 22 - 32 mmol/L 21  20    Calcium  8.9 - 10.3 mg/dL 8.7  8.3    Total Protein 6.0 - 8.5 g/dL   6.5   Total Bilirubin 0.0 - 1.2 mg/dL   <9.5   Alkaline Phos 44 - 121 IU/L   85   AST 0 - 40 IU/L   14   ALT 0 - 32 IU/L   12     Lipid Panel     Component Value Date/Time   CHOL 154 12/22/2023 1129   TRIG 156 (H) 12/22/2023 1129   HDL 45 12/22/2023 1129   CHOLHDL 3.4 12/22/2023 1129   CHOLHDL 2.6 07/22/2023 0938   VLDL 24 07/22/2023 0938   LDLCALC 82 12/22/2023 1129    CBC    Component Value Date/Time   WBC 12.1 (H) 02/21/2024 1433   WBC 7.6 01/06/2024 2137   RBC 5.41 (H) 02/21/2024 1433   RBC 2.94 (L) 01/06/2024 2137   HGB 14.0 02/21/2024 1433   HCT 45.4 02/21/2024 1433   PLT 242 02/21/2024 1433   MCV 84 02/21/2024 1433   MCH 25.9 (L) 02/21/2024 1433   MCH 26.9 01/06/2024 2137   MCHC 30.8 (L) 02/21/2024 1433   MCHC 30.3 01/06/2024 2137   RDW 17.0 (H) 02/21/2024 1433   LYMPHSABS 2.2 02/21/2024 1433   MONOABS 0.4 07/21/2022 0333   EOSABS 0.3 02/21/2024 1433   BASOSABS 0.0 02/21/2024 1433    Lab Results  Component Value Date   HGBA1C 10.0 (A) 05/23/2024      1. DM type 2 with diabetic  peripheral neuropathy (HCC) (Primary) *** - POCT glycosylated hemoglobin (Hb A1C)  2. Need for Streptococcus pneumoniae vaccination *** - Pneumococcal conjugate vaccine 20-valent  3. Urinary symptom or sign *** - POCT URINALYSIS DIP (CLINITEK)   No orders of the defined types were placed in this encounter.   Follow-up: No follow-ups on file.       Joaquin Mulberry, MD, FAAFP. [facility] [place], [place] [phone]   05/23/2024, 10:18 AM

## 2024-05-24 ENCOUNTER — Encounter: Payer: Self-pay | Admitting: Family Medicine

## 2024-05-24 ENCOUNTER — Other Ambulatory Visit (HOSPITAL_COMMUNITY): Payer: Self-pay

## 2024-05-24 ENCOUNTER — Ambulatory Visit: Payer: Self-pay | Admitting: Family Medicine

## 2024-05-24 LAB — BASIC METABOLIC PANEL WITH GFR
BUN/Creatinine Ratio: 11 (ref 9–23)
BUN: 17 mg/dL (ref 6–24)
CO2: 18 mmol/L — ABNORMAL LOW (ref 20–29)
Calcium: 8.8 mg/dL (ref 8.7–10.2)
Chloride: 101 mmol/L (ref 96–106)
Creatinine, Ser: 1.49 mg/dL — ABNORMAL HIGH (ref 0.57–1.00)
Glucose: 411 mg/dL — ABNORMAL HIGH (ref 70–99)
Potassium: 4.4 mmol/L (ref 3.5–5.2)
Sodium: 134 mmol/L (ref 134–144)
eGFR: 43 mL/min/{1.73_m2} — ABNORMAL LOW (ref >59)

## 2024-05-25 ENCOUNTER — Other Ambulatory Visit: Payer: Self-pay | Admitting: Family Medicine

## 2024-05-25 MED ORDER — MISC. DEVICES MISC: 0 refills | Status: DC

## 2024-05-28 DIAGNOSIS — F1020 Alcohol dependence, uncomplicated: Secondary | ICD-10-CM | POA: Diagnosis not present

## 2024-05-29 ENCOUNTER — Ambulatory Visit: Attending: Family Medicine

## 2024-05-29 ENCOUNTER — Ambulatory Visit: Admitting: Internal Medicine

## 2024-05-29 ENCOUNTER — Encounter: Payer: Self-pay | Admitting: Internal Medicine

## 2024-05-29 ENCOUNTER — Other Ambulatory Visit (HOSPITAL_COMMUNITY)
Admission: RE | Admit: 2024-05-29 | Discharge: 2024-05-29 | Disposition: A | Discharge status: Home / Self Care | Source: Ambulatory Visit | Attending: Family Medicine | Admitting: Family Medicine

## 2024-05-29 VITALS — BP 118/72 | HR 88 | Ht 64.0 in | Wt 234.0 lb

## 2024-05-29 DIAGNOSIS — R399 Unspecified symptoms and signs involving the genitourinary system: Secondary | ICD-10-CM

## 2024-05-29 DIAGNOSIS — E1165 Type 2 diabetes mellitus with hyperglycemia: Secondary | ICD-10-CM | POA: Diagnosis not present

## 2024-05-29 DIAGNOSIS — N1831 Chronic kidney disease, stage 3a: Secondary | ICD-10-CM

## 2024-05-29 DIAGNOSIS — Z794 Long term (current) use of insulin: Secondary | ICD-10-CM | POA: Diagnosis not present

## 2024-05-29 DIAGNOSIS — Z89512 Acquired absence of left leg below knee: Secondary | ICD-10-CM | POA: Diagnosis not present

## 2024-05-29 DIAGNOSIS — Z89411 Acquired absence of right great toe: Secondary | ICD-10-CM

## 2024-05-29 DIAGNOSIS — E1142 Type 2 diabetes mellitus with diabetic polyneuropathy: Secondary | ICD-10-CM | POA: Diagnosis not present

## 2024-05-29 DIAGNOSIS — E1122 Type 2 diabetes mellitus with diabetic chronic kidney disease: Secondary | ICD-10-CM | POA: Diagnosis not present

## 2024-05-29 MED ORDER — INSULIN PEN NEEDLE 31G X 5 MM MISC: 1.0000 | Freq: Four times a day (QID) | 3 refills | Status: DC

## 2024-05-29 MED ORDER — DEXCOM G7 SENSOR MISC: 1.0000 | 3 refills | Status: DC

## 2024-05-29 MED ORDER — LANTUS SOLOSTAR 100 UNIT/ML ~~LOC~~ SOPN: 40.0000 [IU] | PEN_INJECTOR | Freq: Every day | SUBCUTANEOUS | 3 refills | Status: AC

## 2024-05-29 MED ORDER — INSULIN LISPRO (1 UNIT DIAL) 100 UNIT/ML (KWIKPEN): 14.0000 [IU] | PEN_INJECTOR | Freq: Three times a day (TID) | SUBCUTANEOUS | 4 refills | Status: AC

## 2024-05-29 MED ORDER — TEGADERM AG MESH 4"X5" EX PADS: 1.0000 | MEDICATED_PAD | Freq: Every day | CUTANEOUS | 3 refills | Status: AC

## 2024-05-29 NOTE — Progress Notes (Signed)
 Error

## 2024-05-29 NOTE — Progress Notes (Signed)
 In office today for swab.

## 2024-05-29 NOTE — Patient Instructions (Addendum)
 Use Skin Tac liquid  to the skin before applying the sensor , than cover with tegaderm   Take  Lantus  40 units once daily  And continue to use Humalog  14 units with each meal   HOW TO TREAT LOW BLOOD SUGARS (Blood sugar LESS THAN 70 MG/DL) Please follow the RULE OF 15 for the treatment of hypoglycemia treatment (when your (blood sugars are less than 70 mg/dL)   STEP 1: Take 15 grams of carbohydrates when your blood sugar is low, which includes:  3-4 GLUCOSE TABS  OR 3-4 OZ OF JUICE OR REGULAR SODA OR ONE TUBE OF GLUCOSE GEL    STEP 2: RECHECK blood sugar in 15 MINUTES STEP 3: If your blood sugar is still low at the 15 minute recheck --> then, go back to STEP 1 and treat AGAIN with another 15 grams of carbohydrates.

## 2024-05-29 NOTE — Progress Notes (Signed)
 Name: Sarah Long  MRN/ DOB: [id number], 1971/01/10   Age/ Sex: 53 y.o., female    PCP: Delbert Clam, MD   Reason for Endocrinology Evaluation: Type 2 Diabetes Mellitus     Date of Initial Endocrinology Visit: 11/02/2021    PATIENT IDENTIFIER: [name] is a 53 y.o. female with a past medical history of HTN , Bipolar d/o,and T2DM, has Hx of Pancreatitis , fabry's disease . The patient presented for initial endocrinology clinic visit on 11/02/2021 for consultative assistance with her diabetes management.    HPI: Sarah Long was    Diagnosed with DM at age 78 stated as gestational diabetes by age 19 she became diagnosed with T2 DM  Prior Medications tried/Intolerance: Intolerant to Metformin .  Hemoglobin A1c has ranged from 7.4% in 2022, peaking at 13.8% in 2014. Patient has required hospitalization within the last 1 year from hyper or hypoglycemia: DKA 06/2021   On her initial visit to our clinic she had an A1c 7.4%, we continued MDI regimen and provided her with correction scale    She was trained on OmniPod 07/2023  SUBJECTIVE:   During the last visit (11/25/2023): A1c 10.2%     Today (05/29/24): Sarah Long is here for a follow up on diabetes management. She checks her  blood sugars multiple times daily through CGM.  The patient has not had hypoglycemic episodes since the last clinic visit.  Patient continues to follow-up with behavioral health for bipolar disorder and depression She follows with cardiology for CHF Was treated for acute cystitis 04/2024 She is on Galafold for Fabry's disease  Works 8 pm to 8 AM  Has occasional nausea  Continues with changes in bowel movements   This patient with type 2 diabetes is treated with Omnipod (insulin  pump). During the visit the pump basal and bolus doses were reviewed including carb/insulin  rations and supplemental doses. The clinical list was updated. The glucose meter download was reviewed in detail to determine if the  current pump settings are providing the best glycemic control without excessive hypoglycemia.  Pump and meter download:        Pump   OmniPod Settings   Insulin  type   Humalog    Basal rate       0000 2.10u/h       I:C ratio       0000  1:1    Enter 14               Sensitivity       0000  20      Goal       0000  110          Type & Model of Pump: OmniPod Insulin  Type: Currently using Humalog .  Body mass index is 40.17 kg/m.  PUMP STATISTICS: Average BG: 208 Average Daily Carbs (g): 14 Average Total Daily Insulin : 33.1 Average Daily Basal: 31.1 (94 %) Average Daily Bolus: 2 (6%)   HOME DIABETES REGIMEN:  Humalog       Statin: no ACE-I/ARB: no Prior Diabetic Education: yes  CONTINUOUS GLUCOSE MONITORING RECORD INTERPRETATION    Dates of Recording: 6/11-6/24/2025  Sensor description: Dexcom G7  Results statistics:   CGM use % of time 16.9  Average and SD 208/65  Time in range 39  %  % Time Above 180 37  % Time above 250 24  % Time Below target 0   Glycemic patterns summary: BG's trend down to optimal overnight and increased throughout the day  Hyperglycemic episodes postprandial  Hypoglycemic episodes occurred N/A  Overnight periods: Variable     DIABETIC COMPLICATIONS: Microvascular complications:  Left BKA, Right  great toe amputation , neuropathy  Denies: CKD, retinopathy  Last eye exam: Completed 01/2021  Macrovascular complications:  PAD Denies: CAD, CVA   PAST HISTORY: Past Medical History:  Past Medical History:  Diagnosis Date   Abnormal Pap smear 1998   Abnormal vaginal bleeding 12/19/2011   Acute urinary retention 06/26/2021   Arthritis    Bacterial infection    Bipolar 1 disorder (HCC)    Blister of second toe of left foot 11/21/2016   Candida vaginitis 07/2007   Depression    recently added wellbutrin-has not taken yet for bipolar   Diabetes in pregnancy    Diabetes mellitus    nph 20U qam and qpm,  regular with meals   Diabetic ketoacidosis without coma associated with type 2 diabetes mellitus (HCC)    Fibroid    Galactorrhea of right breast 2008   GERD (gastroesophageal reflux disease)    H/O amenorrhea 07/2007   H/O dizziness 10/14/2011   H/O dysmenorrhea 2010   H/O menorrhagia 10/14/2011   H/O varicella    Headache(784.0)    Heavy vaginal bleeding due to contraceptive injection use 10/12/2011   Depo Provera    Herpes    Homelessness 11/19/2021   HSV-2 infection 01/03/2009   Hx: UTI (urinary tract infection) 2009   Hypertension    on aldomet   Hypoalbuminemia due to protein-calorie malnutrition (HCC) 06/26/2021   Increased BMI 2010   Irregular uterine bleeding 04/04/2012   Pt has mirena    Obesity 10/14/2011   Oligomenorrhea 07/2007   Osteomyelitis of great toe of right foot (HCC) 03/22/2022   Pelvic pain in female    Presence of permanent cardiac pacemaker    Preterm labor    PTSD (post-traumatic stress disorder)    Trichomonas    Yeast infection    Past Surgical History:  Past Surgical History:  Procedure Laterality Date   ABDOMINAL AORTOGRAM W/LOWER EXTREMITY N/A 06/17/2021   Procedure: ABDOMINAL AORTOGRAM W/LOWER EXTREMITY;  Surgeon: Darron Deatrice LABOR, MD;  [facility]: [facility];  Service: Cardiovascular;  Laterality: N/A;   AMPUTATION Left 06/27/2021   Procedure: AMPUTATION BELOW KNEE;  Surgeon: Harden Jerona GAILS, MD;  Location: [facility] OR;  Service: Orthopedics;  Laterality: Left;   AMPUTATION Right 09/04/2021   Procedure: RIGHT GREAT TOE AMPUTATION;  Surgeon: Harden Jerona GAILS, MD;  Location: [facility] OR;  Service: Orthopedics;  Laterality: Right;   CESAREAN SECTION  1991   LEFT HEART CATH AND CORONARY ANGIOGRAPHY N/A 02/10/2020   Procedure: LEFT HEART CATH AND CORONARY ANGIOGRAPHY;  Surgeon: Burnard Debby LABOR, MD;  Location: [facility];  Service: Cardiovascular;  Laterality: N/A;   PACEMAKER IMPLANT N/A 02/13/2020   Procedure: PACEMAKER IMPLANT;  Surgeon: Inocencio Soyla Lunger, MD;  Location: [facility];  Service: Cardiovascular;  Laterality: N/A;   TEMPORARY PACEMAKER N/A 02/10/2020   Procedure: TEMPORARY PACEMAKER;  Surgeon: Burnard Debby LABOR, MD;  Location: [facility] INVASIVE CV LAB;  Service: Cardiovascular;  Laterality: N/A;    Social History:  reports that she quit smoking about 2 years ago. Her smoking use included cigarettes. She started smoking about 22 years ago. She has a 10 pack-year smoking history. She has never used smokeless tobacco. She reports that she does not currently use alcohol. She reports that she does not use drugs. Family History:  Family History  Problem  Relation Age of Onset   Hypertension Mother    Diabetes Mother    Heart disease Mother    Hypertension Father    Diabetes Father    Heart disease Father    Depression Maternal Grandmother    Suicidality Maternal Grandmother    Stroke Maternal Grandfather    Schizophrenia Paternal Grandmother    Other Neg Hx    Breast cancer Neg Hx      HOME MEDICATIONS: Allergies as of 05/29/2024       Reactions   Lamictal  [lamotrigine ] Itching   Strawberry Extract Hives   Sulfa Antibiotics Hives, Itching        Medication List        Accurate as of May 29, 2024  9:18 AM. If you have any questions, ask your nurse or doctor.          albuterol  108 (90 Base) MCG/ACT inhaler Commonly known as: VENTOLIN  HFA Inhale 2 puffs into the lungs every 6 (six) hours as needed for wheezing or shortness of breath.   ARIPiprazole  15 MG tablet Commonly known as: ABILIFY  Take 1 tablet (15 mg total) by mouth daily.   atorvastatin  20 MG tablet Commonly known as: LIPITOR  Take 1 tablet (20 mg total) by mouth daily.   Blood Pressure Cuff Misc Please monitor and check blood pressure daily please call the clinic if blood pressure is over 140   Carestart COVID-19 Home Test Kit Generic drug: COVID-19 At Home Antigen Test See admin instructions.   carvedilol  12.5 MG tablet Commonly  known as: COREG  Take 1 tablet (12.5 mg total) by mouth 2 (two) times daily with a meal.   clotrimazole  1 % cream Commonly known as: Clotrimazole  AF Apply 1 Application topically 2 (two) times daily.   dapagliflozin  propanediol 10 MG Tabs tablet Commonly known as: Farxiga  Take 1 tablet (10 mg total) by mouth daily before breakfast.   Dexcom G7 Sensor Misc 1 Device by Does not apply route as directed.   furosemide  20 MG tablet Commonly known as: LASIX  Take 100 mg by mouth daily.   Galafold 123 MG Caps Generic drug: migALAstat HCl Take by mouth.   hydrALAZINE  50 MG tablet Commonly known as: APRESOLINE  Take one three times daily   hydrOXYzine  25 MG tablet Commonly known as: ATARAX  Take 25 mg by mouth daily as needed.   insulin  lispro 100 UNIT/ML KwikPen Commonly known as: HUMALOG  Inject 14 Units into the skin. Before meals daily   insulin  lispro 100 UNIT/ML injection Commonly known as: HUMALOG  MAX DAILY 125 UNITS A DAY PER PUMP   Insulin  Pen Needle 31G X 5 MM Misc Use as directed in the morning, at noon, in the evening, and at bedtime.   isosorbide  mononitrate 20 MG tablet Commonly known as: ISMO  Take 1 tablet (20 mg total) by mouth daily.   Lantus  SoloStar 100 UNIT/ML Solostar Pen Generic drug: insulin  glargine Inject 60 Units into the skin daily.   Misc. Devices Misc Weighing Scale. Diagnosis: CHF   nystatin  powder Commonly known as: MYCOSTATIN /NYSTOP  Apply 1 Application topically 2 (two) times daily.   Omnipod 5 G7 Intro (Gen 5) Kit 1 Device by Does not apply route every other day.   Omnipod 5 DexG7G6 Pods Gen 5 Misc SMARTSIG:SUB-Q Every Other Day   valsartan  320 MG tablet Commonly known as: DIOVAN  Take 1 tablet (320 mg total) by mouth daily.   varenicline  1 MG tablet Commonly known as: Chantix  Continuing Month Pak Take 1 tablet (1 mg total) by  mouth 2 (two) times daily.         ALLERGIES: Allergies  Allergen Reactions   Lamictal   [Lamotrigine ] Itching   Strawberry Extract Hives   Sulfa Antibiotics Hives and Itching     REVIEW OF SYSTEMS: A comprehensive ROS was conducted with the patient and is negative except as per HPI     OBJECTIVE:   VITAL SIGNS: BP 118/72 (BP Location: Left Arm, Patient Position: Sitting, Cuff Size: Normal)   Pulse 88   Ht 5' 4 (1.626 m)   Wt 234 lb (106.1 kg)   BMI 40.17 kg/m    PHYSICAL EXAM:  General: Pt appears well and is in NAD  Lungs: Clear with good BS bilat   Heart: RRR   Extremities: Left BKA   Neuro: MS is good with appropriate affect, pt is alert and Ox3   DM Foot Exam 06/30/2023  Left below-knee amputation  Right great toe amputation  the pedal pulses 1+ on right  The sensation is absent on the right   DATA REVIEWED:  Lab Results  Component Value Date   HGBA1C 10.0 (A) 05/23/2024   HGBA1C 10.2 (A) 11/25/2023   HGBA1C 10.9 (A) 06/30/2023    Latest Reference Range & Units 05/23/24 11:00  Sodium 134 - 144 mmol/L 134  Potassium 3.5 - 5.2 mmol/L 4.4  Chloride 96 - 106 mmol/L 101  CO2 20 - 29 mmol/L 18 (L)  Glucose 70 - 99 mg/dL 588 (H)  BUN 6 - 24 mg/dL 17  Creatinine 9.42 - 8.99 mg/dL 8.50 (H)  Calcium  8.7 - 10.2 mg/dL 8.8  BUN/Creatinine Ratio 9 - 23  11  eGFR >59 mL/min/1.73 43 (L)  (L): Data is abnormally low (H): Data is abnormally high  ASSESSMENT / PLAN / RECOMMENDATIONS:   1) Type 2 Diabetes Mellitus, Poorly  controlled, With neuropathic complications,CKD III,  S/P Left BKA and Right great toe amputation  - Most recent A1c of 10.0 %. Goal A1c < 7.0 %.    -Patient continues with hyperglycemia, this is due to lack of consistent use of OmniPod/Dexcom - She has a hx of Pancreatitis , not a candidate for DPP 4 inhibitors and GLP-1 agonist - Has hx of DKA , would avoid SGLT2 inhibitors - Pt will switch to multiple daily injections of insulin   - She was advised to use skin tac and tegaderm to improve adherence of dexcom      MEDICATIONS: -  Stop Omnipod  - Take lantus  40 units daily  - Take Humalog  14 units TIDQAC   EDUCATION / INSTRUCTIONS: BG monitoring instructions: Patient is instructed to check her blood sugars 3 times a day, before meals . Call [facility] if: BG persistently < 70  I reviewed the Rule of 15 for the treatment of hypoglycemia in detail with the patient. Literature supplied.   2) Diabetic complications:  Eye: Does not have known diabetic retinopathy.  Neuro/ Feet: Does  have known diabetic peripheral neuropathy. Renal: Patient does not have known baseline CKD. She is not on an ACEI/ARB at present.   Follow-up in 3 months  Signed electronically by: Stefano Redgie Butts, MD  [facility]  [facility] [address] [place], [place] [address] Phone: [phone] FAX: [phone]   CC: Delbert Clam, MD [address] [place] [address] Phone: [phone]  Fax: [phone]    Return to Endocrinology clinic as below: Future Appointments  Date Time Provider Department Center  06/01/2024  10:00 AM Arno Rosaline SQUIBB, RN [facility] None  06/26/2024  2:00 PM [facility] E[facility] OP 1 [facility]  06/26/2024  3:00 PM Rolan Ezra RAMAN, MD [facility] None  08/23/2024 10:30 AM Delbert Clam, MD [facility] None

## 2024-05-30 LAB — CERVICOVAGINAL ANCILLARY ONLY
Bacterial Vaginitis (gardnerella): POSITIVE — AB
Candida Glabrata: NEGATIVE
Candida Vaginitis: NEGATIVE
Chlamydia: NEGATIVE
Comment: NEGATIVE
Comment: NEGATIVE
Comment: NEGATIVE
Comment: NEGATIVE
Comment: NEGATIVE
Comment: NORMAL
Neisseria Gonorrhea: NEGATIVE
Trichomonas: NEGATIVE

## 2024-05-31 MED ORDER — METRONIDAZOLE 500 MG PO TABS: 500.0000 mg | ORAL_TABLET | Freq: Two times a day (BID) | ORAL | 0 refills | Status: AC

## 2024-06-01 ENCOUNTER — Other Ambulatory Visit: Payer: Self-pay

## 2024-06-01 NOTE — Patient Outreach (Signed)
 Complex Care Management   Visit Note  06/01/2024  Name:  Sarah Long MRN: [id number] DOB: 1971/09/10  Situation: Referral received for Complex Care Management related to Heart Failure and Diabetes with Complications I obtained verbal consent from Patient.  Visit completed with Marcus Notice  on the phone  Background:   Past Medical History:  Diagnosis Date   Abnormal Pap smear 1998   Abnormal vaginal bleeding 12/19/2011   Acute urinary retention 06/26/2021   Arthritis    Bacterial infection    Bipolar 1 disorder (HCC)    Blister of second toe of left foot 11/21/2016   Candida vaginitis 07/2007   Depression    recently added wellbutrin-has not taken yet for bipolar   Diabetes in pregnancy    Diabetes mellitus    nph 20U qam and qpm, regular with meals   Diabetic ketoacidosis without coma associated with type 2 diabetes mellitus (HCC)    Fibroid    Galactorrhea of right breast 2008   GERD (gastroesophageal reflux disease)    H/O amenorrhea 07/2007   H/O dizziness 10/14/2011   H/O dysmenorrhea 2010   H/O menorrhagia 10/14/2011   H/O varicella    Headache(784.0)    Heavy vaginal bleeding due to contraceptive injection use 10/12/2011   Depo Provera    Herpes    Homelessness 11/19/2021   HSV-2 infection 01/03/2009   Hx: UTI (urinary tract infection) 2009   Hypertension    on aldomet   Hypoalbuminemia due to protein-calorie malnutrition (HCC) 06/26/2021   Increased BMI 2010   Irregular uterine bleeding 04/04/2012   Pt has mirena    Obesity 10/14/2011   Oligomenorrhea 07/2007   Osteomyelitis of great toe of right foot (HCC) 03/22/2022   Pelvic pain in female    Presence of permanent cardiac pacemaker    Preterm labor    PTSD (post-traumatic stress disorder)    Trichomonas    Yeast infection     Assessment: Patient Reported Symptoms:  Cognitive Cognitive Status: Able to follow simple commands, Alert and oriented to person, place, and time, Normal speech and language  skills Cognitive/Intellectual Conditions Management [RPT]: None reported or documented in medical history or problem list      Neurological Neurological Review of Symptoms: Not assessed    HEENT HEENT Symptoms Reported: Not assessed      Cardiovascular Cardiovascular Symptoms Reported: No symptoms reported Does patient have uncontrolled Hypertension?: Yes Is patient checking Blood Pressure at home?: No Cardiovascular Conditions: Heart failure, Hypertension, Valvular disease, High blood cholesterol Cardiovascular Management Strategies: Medical device, Medication therapy, Weight management (Pacemaker) Do You Have a Working Readable Scale?: No Cardiovascular Comment: An order for a scale was placed on 05/25/24. She has not heard anything about order. Patient has an appointment with HF clinic 06/26/24.  Respiratory Respiratory Symptoms Reported: Shortness of breath, Productive cough (Patient feels that SOB and cough are improved. Continues to report yellow mucus production.) Additional Respiratory Details: Patient continues to use PRN Albuterol  1-2 times a week Respiratory Comment: Patient has started to take Chantix  for tobacco cessation. She is having 3-4 cigarettes a day. She reports that she has not been able to schedule test for sleep apnea or PFTs yet. CMRN sent message to CMA asking for assistance scheduling.  Endocrine Patient reports the following symptoms related to hypoglycemia or hyperglycemia : Increased urination Is patient diabetic?: Yes Is patient checking blood sugars at home?: Yes Endocrine Conditions: Diabetes Endocrine Management Strategies: Medical device, Medication therapy Endocrine Comment: Patient had visit with  endo 05/29/24. She is currently doing insulin  injections instead of using her pump due to difficulty with the pump sticking due to the heat. She is following endo instructions for insulin  administration. Next f/u is in 4 months.  Gastrointestinal Gastrointestinal  Symptoms Reported: Not assessed      Genitourinary Genitourinary Symptoms Reported: Urgency, Other Other Genitourinary Symptoms: Increased urination Additional Genitourinary Details: Patient reports she has been started on abx for Bacterial Vaginitis Genitourinary Conditions: Incontinence (occasional episodes of stress incontinence when she coughs) Genitourinary Management Strategies: Coping strategies  Integumentary Integumentary Symptoms Reported: Not assessed    Musculoskeletal Musculoskelatal Symptoms Reviewed: Unsteady gait Musculoskeletal Conditions: Amputation Musculoskeletal Management Strategies: Medical device, Adequate rest, Activity Falls in the past year?: Yes Number of falls in past year: 2 or more (No falls since previous CMRN visit) Was there an injury with Fall?: No Fall Risk Category Calculator: 2 Patient Fall Risk Level: Moderate Fall Risk Patient at Risk for Falls Due to: History of fall(s) Fall risk Follow up: Falls evaluation completed  Psychosocial Psychosocial Symptoms Reported: Not assessed            05/23/2024   10:06 AM  Depression screen PHQ 2/9  Decreased Interest 2  Down, Depressed, Hopeless 2  PHQ - 2 Score 4  Altered sleeping 2  Tired, decreased energy 3  Change in appetite 2  Feeling bad or failure about yourself  0  Trouble concentrating 0  Moving slowly or fidgety/restless 0  Suicidal thoughts 0  PHQ-9 Score 11    There were no vitals filed for this visit.  Medications Reviewed Today     Reviewed by Arno Rosaline SQUIBB, RN (Registered Nurse) on 06/01/24 at 1024  Med List Status: <None>   Medication Order Taking? Sig Documenting Provider Last Dose Status Informant  albuterol  (VENTOLIN  HFA) 108 (90 Base) MCG/ACT inhaler [id number]  Inhale 2 puffs into the lungs every 6 (six) hours as needed for wheezing or shortness of breath. Newlin, Enobong, MD  Active   ARIPiprazole  (ABILIFY ) 15 MG tablet [id number]  Take 1 tablet (15 mg total) by  mouth daily. Mercy Lauraine LABOR  Active   atorvastatin  (LIPITOR ) 20 MG tablet [id number]  Take 1 tablet (20 mg total) by mouth daily. Brien Belvie BRAVO, MD  Active   Blood Pressure Monitoring (BLOOD PRESSURE CUFF) MISC [id number]  Please monitor and check blood pressure daily please call the clinic if blood pressure is over 140 [place], [place]  Active   CARESTART COVID-19 HOME TEST KIT [id number]  See admin instructions. [provider]  Active   carvedilol  (COREG ) 12.5 MG tablet [id number]  Take 1 tablet (12.5 mg total) by mouth 2 (two) times daily with a meal. Brien Belvie BRAVO, MD  Active   clotrimazole  (CLOTRIMAZOLE  AF) 1 % cream [id number]  Apply 1 Application topically 2 (two) times daily. Newlin, Enobong, MD  Active   Continuous Glucose Sensor (DEXCOM G7 SENSOR) MISC [id number]  1 Device by Does not apply route as directed. Every 10 days Shamleffer, Ibtehal Jaralla, MD  Active   dapagliflozin  propanediol (FARXIGA ) 10 MG TABS tablet [id number]  Take 1 tablet (10 mg total) by mouth daily before breakfast. Glena Harlene HERO, FNP  Active   furosemide  (LASIX ) 20 MG tablet [id number]  Take 100 mg by mouth daily. [provider]  Active   GALAFOLD 123 MG CAPS [id number]  Take by mouth. [provider]  Active   hydrALAZINE  (APRESOLINE ) 50 MG tablet [id number]  Take one three times  daily Brien Belvie BRAVO, MD  Active   hydrOXYzine  (ATARAX ) 25 MG tablet [id number]  Take 25 mg by mouth daily as needed. [provider]  Active   insulin  glargine (LANTUS  SOLOSTAR) 100 UNIT/ML Solostar Pen [id number]  Inject 40 Units into the skin daily. Shamleffer, Donell Cardinal, MD  Active   insulin  lispro (HUMALOG ) 100 UNIT/ML KwikPen [id number]  Inject 14 Units into the skin 3 (three) times daily. Before meals daily Shamleffer, Ibtehal Jaralla, MD  Active   Insulin  Pen Needle 31G X 5 MM MISC [id number]  Use as directed in the morning, at noon, in the evening, and at bedtime. Shamleffer,  Ibtehal Jaralla, MD  Active   isosorbide  mononitrate (ISMO ) 20 MG tablet [id number]  Take 1 tablet (20 mg total) by mouth daily. Brien Belvie BRAVO, MD  Active   metroNIDAZOLE  (FLAGYL ) 500 MG tablet [id number]  Take 1 tablet (500 mg total) by mouth 2 (two) times daily for 7 days. Newlin, Enobong, MD  Active   Misc. Devices MISC [id number]  Weighing Scale. Diagnosis: CHF Delbert Clam, MD  Active   nystatin  (MYCOSTATIN /NYSTOP ) powder [id number]  Apply 1 Application topically 2 (two) times daily. Newlin, Enobong, MD  Active   Silver (TEGADERM AG MESH) 4X5 PADS [id number]  Apply 1 Device topically daily in the afternoon. Shamleffer, Donell Cardinal, MD  Active     Discontinued 04/27/23 1048 (Change in therapy)            Med Note>> Procita, April M, [facility]   04/27/2023 10:48 AM waiting on cardiologist recommendation 04/27/23 pt currently in hyperkalemia    valsartan  (DIOVAN ) 320 MG tablet [id number]  Take 1 tablet (320 mg total) by mouth daily. Brien Belvie BRAVO, MD  Active   varenicline  (CHANTIX  CONTINUING MONTH PAK) 1 MG tablet [id number]  Take 1 tablet (1 mg total) by mouth 2 (two) times daily. Newlin, Enobong, MD  Active             Recommendation:   Specialty provider follow-up endocrinology, HF clinic Continue Current Plan of Care  Follow Up Plan:   Telephone follow up appointment date/time:  06/15/24 at 10 AM  [name], RN MSN [place]  [facility] RN Care Manager Direct Dial : [phone]  Fax: [phone]

## 2024-06-01 NOTE — Patient Instructions (Signed)
 Visit Information  Sarah Long was given information about Medicaid Managed Care team care coordination services as a part of their [employer] Medicaid benefit. Sarah Long verbally consentedto engagement with the [facility] team.   If you are experiencing a medical emergency, please call 911 or report to your local emergency department or urgent care.   If you have a non-emergency medical problem during routine business hours, please contact your provider's office and ask to speak with a nurse.   For questions related to your [employer] [facility] health plan, please call: [phone]  OR visit the member homepage at: [email or web address]  If you would like to schedule transportation through your [employer] [facility] plan, please call the following number at least 2 days in advance of your appointment: [phone]  If you are experiencing a behavioral health crisis, call the [employer] [place]  Behavioral Health Crisis Line at 1-2514933313 [phone]). The line is available 24 hours a day, seven days a week.  If you would like help to quit smoking, call 1-800-QUIT-NOW ((208)179-2579) OR Espaol: 1-855-Djelo-Ya (8-144-664-6430) o para ms informacin haga clic aqu or Text READY to 799-599 to register via text   [name] - following are the goals we discussed in your visit today:    Goals Addressed             This Visit's Progress    VBCI RN Care Plan   On track    Problems:  Chronic Disease Management support and education needs related to CHF and DMII  Goal: Over the next 30 days the Patient will demonstrate Improved adherence to prescribed treatment plan for DMII as evidenced by improved blood sugar readings towards goal (last A1c 10.0) take all medications exactly as prescribed and will call provider for medication related questions as evidenced by patient report of medication  adherence    Keep follow-up visit with endocrinology Obtain scale and begin weighing daily Scheduled PFTs  Interventions:   Diabetes Interventions: Assessed patient's understanding of A1c goal: <7% Reviewed medications with patient and discussed importance of medication adherence Counseled on importance of regular laboratory monitoring as prescribed Discussed plans with patient for ongoing care management follow up and provided patient with direct contact information for care management team Advised patient, providing education and rationale, to check cbg as advised by provider and record, calling endocrinology or PCP for findings outside established parameters Ensured patient has endocrinology follow-up scheduled Lab Results  Component Value Date   HGBA1C 10.0 (A) 05/23/2024    Evaluation of current treatment plan related to chronic illness,  self-management and patient's adherence to plan as established by provider. Discussed plans with patient for ongoing care management follow up and provided patient with direct contact information for care management team Message sent to CMA for assistance scheduling PFTs  Patient Self-Care Activities:  Attend all scheduled provider appointments Call provider office for new concerns or questions  Take medications as prescribed   check blood sugar at prescribed times: as advised by provider check feet daily for cuts, sores or redness take the blood sugar log to all doctor visits drink 6 to 8 glasses of water  each day  Plan:  Telephone follow up appointment with care management team member scheduled for:  06/15/24 at 10 AM             Patient verbalizes understanding of instructions and care plan provided today and agrees to view in MyChart. Active MyChart status and patient understanding of how to  access instructions and care plan via MyChart confirmed with patient.     RN Care Manager will assist with getting PFTs scheduled Telephone  follow up appointment with Managed Medicaid care management team member scheduled for: 06/15/24 at 10 AM  [name], RN MSN [place]  [facility] Health RN Care Manager Direct Dial : [phone]  Fax: [phone]   Following is a copy of your plan of care:  There are no care plans that you recently modified to display for this patient.

## 2024-06-04 DIAGNOSIS — F1020 Alcohol dependence, uncomplicated: Secondary | ICD-10-CM | POA: Diagnosis not present

## 2024-06-11 DIAGNOSIS — F1020 Alcohol dependence, uncomplicated: Secondary | ICD-10-CM | POA: Diagnosis not present

## 2024-06-12 DIAGNOSIS — H40013 Open angle with borderline findings, low risk, bilateral: Secondary | ICD-10-CM | POA: Diagnosis not present

## 2024-06-12 DIAGNOSIS — H2513 Age-related nuclear cataract, bilateral: Secondary | ICD-10-CM | POA: Diagnosis not present

## 2024-06-12 DIAGNOSIS — E119 Type 2 diabetes mellitus without complications: Secondary | ICD-10-CM | POA: Diagnosis not present

## 2024-06-12 DIAGNOSIS — D492 Neoplasm of unspecified behavior of bone, soft tissue, and skin: Secondary | ICD-10-CM | POA: Diagnosis not present

## 2024-06-12 DIAGNOSIS — H43393 Other vitreous opacities, bilateral: Secondary | ICD-10-CM | POA: Diagnosis not present

## 2024-06-12 LAB — HM DIABETES EYE EXAM

## 2024-06-15 ENCOUNTER — Other Ambulatory Visit: Payer: Self-pay | Admitting: Internal Medicine

## 2024-06-15 ENCOUNTER — Other Ambulatory Visit: Payer: Self-pay

## 2024-06-15 DIAGNOSIS — Z139 Encounter for screening, unspecified: Secondary | ICD-10-CM

## 2024-06-15 MED ORDER — ACCU-CHEK GUIDE TEST VI STRP: 1.0000 | ORAL_STRIP | Freq: Every day | 12 refills | Status: DC

## 2024-06-15 MED ORDER — ACCU-CHEK GUIDE W/DEVICE KIT
1.0000 | PACK | Freq: Every day | 0 refills | Status: DC
Start: 2024-06-15 — End: 2024-07-26

## 2024-06-15 NOTE — Patient Instructions (Signed)
 Visit Information  Sarah Long was given information about Medicaid Managed Care team care coordination services as a part of their [employer] Medicaid benefit. Sarah Long verbally consentedto engagement with the [facility] team.   If you are experiencing a medical emergency, please call 911 or report to your local emergency department or urgent care.   If you have a non-emergency medical problem during routine business hours, please contact your provider's office and ask to speak with a nurse.   For questions related to your [facility] [facility] health plan, please call: [phone]  OR visit the member homepage at: [email or web address]  If you would like to schedule transportation through your [facility] plan, please call the following number at least 2 days in advance of your appointment: [phone]  If you are experiencing a behavioral health crisis, call the [employer] [facility]  Behavioral Health Crisis Line at [phone]). The line is available 24 hours a day, seven days a week.  If you would like help to quit smoking, call 1-800-QUIT-NOW ((272) 715-6279) OR Espaol: 1-855-Djelo-Ya (8-144-664-6430) o para ms informacin haga clic aqu or Text READY to 799-599 to register via text   Sarah Long - following are the goals we discussed in your visit today:    Goals Addressed             This Visit's Progress    VBCI RN Care Plan   On track    Problems:  Chronic Disease Management support and education needs related to CHF and DMII  Goal: Over the next 30 days the Patient will demonstrate Improved adherence to prescribed treatment plan for DMII as evidenced by improved blood sugar readings towards goal (last A1c 10.0) take all medications exactly as prescribed and will call provider for medication related questions as evidenced by patient report of medication  adherence    work with Child psychotherapist to address concerns related to utility bill and food stamps related to the management of chronic disease as evidenced by review of electronic medical record and patient or social worker report     Obtain scale and begin weighing daily Scheduled PFTs  Interventions:   Diabetes Interventions: Assessed patient's understanding of A1c goal: <7% Reviewed medications with patient and discussed importance of medication adherence Counseled on importance of regular laboratory monitoring as prescribed Discussed plans with patient for ongoing care management follow up and provided patient with direct contact information for care management team Advised patient, providing education and rationale, to check cbg as advised by provider and record, calling endocrinology or PCP for findings outside established parameters Message sent to endocrinologist regarding patient request for glucometer Lab Results  Component Value Date   HGBA1C 10.0 (A) 05/23/2024    Evaluation of current treatment plan related to chronic illness,  self-management and patient's adherence to plan as established by provider. Discussed plans with patient for ongoing care management follow up and provided patient with direct contact information for care management team Social Work referral for utility resources and assistance navigating food stamps Message sent to [facility] for assistance scheduling PFTs  Patient Self-Care Activities:  Attend all scheduled provider appointments Call provider office for new concerns or questions  Take medications as prescribed   check blood sugar at prescribed times: as advised by provider check feet daily for cuts, sores or redness take the blood sugar log to all doctor visits drink 6 to 8 glasses of water  each day  Plan:  Telephone follow up appointment with  care management team member scheduled for:  07/05/24 at 1 PM             Patient verbalizes  understanding of instructions and care plan provided today and agrees to view in MyChart. Active MyChart status and patient understanding of how to access instructions and care plan via MyChart confirmed with patient.     Telephone follow up appointment with Managed Medicaid care management team member scheduled for: Referral for social work has been placed and scheduled for 06/18/24 at 2:30 PM  Sarah Finlay, RN MSN [place]  VBCI Population Health RN Care Manager Direct Dial : [phone]  Fax: [phone]   Following is a copy of your plan of care:  There are no care plans that you recently modified to display for this patient.

## 2024-06-15 NOTE — Patient Outreach (Signed)
 Care Coordination   06/15/2024 Name: Sarah Long MRN: [id number] DOB: [birth year]   Care Coordination Outreach Attempts: An unsuccessful telephone outreach was attempted today to complete follow-up CMRN visit.   Follow Up Plan:  Additional outreach attempts will be made to offer the patient complex care management information and services.   Encounter Outcome:  No Answer. HIPAA compliant voicemail left.   [name], RN MSN [place]  VBCI Population Health RN Care Manager Direct Dial : [phone]  Fax: [phone]

## 2024-06-15 NOTE — Patient Outreach (Signed)
 Complex Care Management   Visit Note  06/15/2024  Name:  Sarah Long MRN: [id number] DOB: 18-Nov-1971  Situation: Referral received for Complex Care Management related to Diabetes with Complications I obtained verbal consent from Patient.  Visit completed with Marcus Notice  on the phone  Background:   Past Medical History:  Diagnosis Date   Abnormal Pap smear 1998   Abnormal vaginal bleeding 12/19/2011   Acute urinary retention 06/26/2021   Arthritis    Bacterial infection    Bipolar 1 disorder (HCC)    Blister of second toe of left foot 11/21/2016   Candida vaginitis 07/2007   Depression    recently added wellbutrin-has not taken yet for bipolar   Diabetes in pregnancy    Diabetes mellitus    nph 20U qam and qpm, regular with meals   Diabetic ketoacidosis without coma associated with type 2 diabetes mellitus (HCC)    Fibroid    Galactorrhea of right breast 2008   GERD (gastroesophageal reflux disease)    H/O amenorrhea 07/2007   H/O dizziness 10/14/2011   H/O dysmenorrhea 2010   H/O menorrhagia 10/14/2011   H/O varicella    Headache(784.0)    Heavy vaginal bleeding due to contraceptive injection use 10/12/2011   Depo Provera    Herpes    Homelessness 11/19/2021   HSV-2 infection 01/03/2009   Hx: UTI (urinary tract infection) 2009   Hypertension    on aldomet   Hypoalbuminemia due to protein-calorie malnutrition (HCC) 06/26/2021   Increased BMI 2010   Irregular uterine bleeding 04/04/2012   Pt has mirena    Obesity 10/14/2011   Oligomenorrhea 07/2007   Osteomyelitis of great toe of right foot (HCC) 03/22/2022   Pelvic pain in female    Presence of permanent cardiac pacemaker    Preterm labor    PTSD (post-traumatic stress disorder)    Trichomonas    Yeast infection     Assessment: Patient Reported Symptoms:  Cognitive Cognitive Status: Able to follow simple commands, Alert and oriented to person, place, and time, Normal speech and language  skills Cognitive/Intellectual Conditions Management [RPT]: None reported or documented in medical history or problem list      Neurological Neurological Review of Symptoms: Not assessed    HEENT HEENT Symptoms Reported: Not assessed      Cardiovascular Cardiovascular Symptoms Reported: No symptoms reported Does patient have uncontrolled Hypertension?: Yes Is patient checking Blood Pressure at home?: No Cardiovascular Management Strategies: Medical device, Medication therapy, Weight management (Pacemaker) Cardiovascular Comment: Patient is scheduled for an ECHO and visit with HF clinic 06/26/24  Respiratory Respiratory Symptoms Reported: Shortness of breath, Productive cough (Yellow-ish mucus production I imagine it's a smoker's cough)    Endocrine Endocrine Symptoms Reported: Increased urination Is patient diabetic?: Yes Is patient checking blood sugars at home?: No (Patient has not been able to check her sugars due to Dexcom G7 not sticking to her) Endocrine Comment: Patient reports she needs a glucometer. Her Dexcom G7 is not sticking to her  Gastrointestinal Gastrointestinal Symptoms Reported: Not assessed      Genitourinary Genitourinary Symptoms Reported: Not assessed    Integumentary Integumentary Symptoms Reported: Not assessed    Musculoskeletal Musculoskelatal Symptoms Reviewed: Not assessed        Psychosocial Psychosocial Symptoms Reported: Not assessed            05/23/2024   10:06 AM  Depression screen PHQ 2/9  Decreased Interest 2  Down, Depressed, Hopeless 2  PHQ - 2 Score  4  Altered sleeping 2  Tired, decreased energy 3  Change in appetite 2  Feeling bad or failure about yourself  0  Trouble concentrating 0  Moving slowly or fidgety/restless 0  Suicidal thoughts 0  PHQ-9 Score 11    There were no vitals filed for this visit.  Medications Reviewed Today   Medications were not reviewed in this encounter     Recommendation:   Referral to: BSW  scheduled 06/18/24 at 2:30 PM DME requests:  other glucometer Continue Current Plan of Care  Follow Up Plan:   Telephone follow up appointment date/time:  07/05/24 at 1 PM  [name], RN MSN [place]  VBCI Population Health RN Care Manager Direct Dial : [phone]  Fax: [phone]

## 2024-06-18 ENCOUNTER — Other Ambulatory Visit: Payer: Self-pay

## 2024-06-18 DIAGNOSIS — F1020 Alcohol dependence, uncomplicated: Secondary | ICD-10-CM | POA: Diagnosis not present

## 2024-06-18 NOTE — Patient Instructions (Signed)
 Visit Information  Ms. Gulas was given information about Medicaid Managed Care team care coordination services as a part of their [employer] Medicaid benefit. Marcus JONELLE Roark verbally consentedto engagement with the [facility] team.   If you are experiencing a medical emergency, please call 911 or report to your local emergency department or urgent care.   If you have a non-emergency medical problem during routine business hours, please contact your provider's office and ask to speak with a nurse.   For questions related to your [employer] [facility] health plan, please call: [phone]  OR visit the member homepage at: [email or web address]  If you would like to schedule transportation through your [facility] plan, please call the following number at least 2 days in advance of your appointment: [phone]  If you are experiencing a behavioral health crisis, call the [employer] [place]  Behavioral Health Crisis Line at [phone]). The line is available 24 hours a day, seven days a week.  If you would like help to quit smoking, call 1-800-QUIT-NOW (734-544-9192) OR Espaol: 1-855-Djelo-Ya (8-144-664-6430) o para ms informacin haga clic aqu or Text READY to 799-599 to register via text   Ms. Roark - following are the goals we discussed in your visit today:    Goals Addressed             This Visit's Progress    BSW VBCI Social Work Care Plan   On track    Problems:   Food Insecurity  and utility assistance  CSW Clinical Goal(s):   Over the next 3 weeks the Patient will work with Child psychotherapist to address concerns related to food insecurity and utility assistance.  Interventions:  BSW will provide patient food referrals for food pantries. BSW provided patient education on extra benefits/value-added benefits for household through children's health insurance;  specifically utility assistance through the [employer] with [place].   Patient Goals/Self-Care Activities:  Call [employer] [place] regarding extra-value benefits for food and utility assistance.  Plan:   Telephone follow up appointment with care management team member scheduled for:  07/09/2024 at 2:30pm        Patient verbalizes understanding of instructions and care plan provided today and agrees to view in MyChart. Active MyChart status and patient understanding of how to access instructions and care plan via MyChart confirmed with patient.     Telephone follow up appointment with Managed Medicaid care management team member scheduled for: 07/09/2024 at 2:30pm  Laymon Doll, [place]/VBCI - [facility] Social Worker [phone]   Following is a copy of your plan of care:  There are no care plans that you recently modified to display for this patient.

## 2024-06-18 NOTE — Patient Outreach (Signed)
 Complex Care Management   Visit Note  06/18/2024  Name:  Sarah Long MRN: [id number] DOB: 01-Oct-1971  Situation: Referral received for Complex Care Management related to SDOH Barriers:  Food insecurity And utility assistance I obtained verbal consent from Patient.  Visit completed with patient  on the phone  Background:   Past Medical History:  Diagnosis Date   Abnormal Pap smear 1998   Abnormal vaginal bleeding 12/19/2011   Acute urinary retention 06/26/2021   Arthritis    Bacterial infection    Bipolar 1 disorder (HCC)    Blister of second toe of left foot 11/21/2016   Candida vaginitis 07/2007   Depression    recently added wellbutrin-has not taken yet for bipolar   Diabetes in pregnancy    Diabetes mellitus    nph 20U qam and qpm, regular with meals   Diabetic ketoacidosis without coma associated with type 2 diabetes mellitus (HCC)    Fibroid    Galactorrhea of right breast 2008   GERD (gastroesophageal reflux disease)    H/O amenorrhea 07/2007   H/O dizziness 10/14/2011   H/O dysmenorrhea 2010   H/O menorrhagia 10/14/2011   H/O varicella    Headache(784.0)    Heavy vaginal bleeding due to contraceptive injection use 10/12/2011   Depo Provera    Herpes    Homelessness 11/19/2021   HSV-2 infection 01/03/2009   Hx: UTI (urinary tract infection) 2009   Hypertension    on aldomet   Hypoalbuminemia due to protein-calorie malnutrition (HCC) 06/26/2021   Increased BMI 2010   Irregular uterine bleeding 04/04/2012   Pt has mirena    Obesity 10/14/2011   Oligomenorrhea 07/2007   Osteomyelitis of great toe of right foot (HCC) 03/22/2022   Pelvic pain in female    Presence of permanent cardiac pacemaker    Preterm labor    PTSD (post-traumatic stress disorder)    Trichomonas    Yeast infection     Assessment: BSW held initial call with patient. Patient was alert and cognitive. SDOH assessed and the following needs were identified: food insecurity and utility  assistance. Prior to referral, patient reports she was concern regarding a potential decrease in FNS benefit amount since her son was aging/aged out. However, pt reports she called DSS and they confirmed her benefit amount would not be impacted by her sons's age out. Patient is very resourceful and was familiar with food pantries and other local community resources to help meet her needs. Patient states she is involved with her church and does a lot of work with homeless folks and becomes aware of many resources. BSW offered to provide patient additional food pantry information to which the patient was receptive. Patient receives SSI. However, patient stated she will be moving to SSDI soon and will notify DSS of possible increase in benefit for an adjustment to be made. Patient states her main need is exploring options for utility assistance. BSW and patient held conversation regarding value-added benefits through health insurance provided to children in the home. Patient was educated on the [employer] through [place], which her daughter receives, and can potentially help with past due rent or utility. Patient intends to call and attempt to receive assistance through there for the household. Patient was provided BSW direct phone # and requested for f/u to be in 3 weeks to allow for certain changes to happen with SSDI. No additional needs required at this time and BSW emailed patient resources for food and provided links to benefit pages  for value-added benefits.   SDOH Interventions    Flowsheet Row Patient Outreach Telephone from 06/15/2024 in [place] POPULATION HEALTH DEPARTMENT Patient Outreach Telephone from 05/17/2024 in [place] POPULATION HEALTH DEPARTMENT Patient Outreach Telephone from 12/29/2023 in [facility] HEALTH POPULATION HEALTH DEPARTMENT Office Visit from 12/22/2023 in [facility] [place] [facility] [place] Telephone from 10/19/2023 in [facility] Patient Outreach Telephone from 08/10/2023 in [place] POPULATION HEALTH DEPARTMENT  SDOH Interventions        Food Insecurity Interventions AMB Referral  [Patient's son just turned 18 so the amount of food stamps she is receiving are changing] Intervention Not Indicated  [Patient gets food stamps and utilizes food pantries] --  [Pt denied needing food resources, pt is on EBT] Intervention Not Indicated -- Intervention Not Indicated  [Patient utilizing farmers market and food pantrys]  Housing Interventions Intervention Not Indicated  [Patient prioritizes paying her rent. She gets Section 8] Intervention Not Indicated  [Section 8] Patient Declined  [place]ito is on Section 8 and declined needing housing resources but was previously homeless before receiving this voucher] AMB Referral -- Intervention Not Indicated  Transportation Interventions -- -- Payor Benefit Intervention Not Indicated -- Payor Benefit  Utilities Interventions AMB Referral  [Patient has utility bill due next week that she is worried about paying. She has a fixed rate that she has been paying. Referral to BSW for assistance] Intervention Not Indicated  [Patient reports she recently got a letter from social security for a discounted rate] [employer] Provided  [employer] emailed] AMB Referral -- Intervention Not Indicated  Financial Strain Interventions -- -- -- Other (Comment)  [amb referral] Other (Comment)  [LIE[employer] information and [employer]] --  Physical Activity Interventions -- -- -- Intervention Not Indicated -- --  Stress Interventions -- -- [employer] Provided, Provide Counseling Intervention Not Indicated -- --  Social Connections Interventions -- -- -- Intervention Not Indicated -- --  Health Literacy Interventions -- -- -- Intervention Not Indicated -- --      Recommendation:   Call [employer] and [employer] to explore value added benefits; specifically H[employer] Fund  (utility assistance)  Follow Up Plan:   Telephone follow up appointment date/time:  07/09/2024 at 2:30pm  Laymon Doll, BSW [place]/VBCI - [employer] Social Worker [phone]

## 2024-06-26 ENCOUNTER — Ambulatory Visit (HOSPITAL_COMMUNITY): Payer: Self-pay | Admitting: Cardiology

## 2024-06-26 ENCOUNTER — Ambulatory Visit (HOSPITAL_COMMUNITY)
Admission: RE | Admit: 2024-06-26 | Discharge: 2024-06-26 | Disposition: A | Discharge status: Home / Self Care | Source: Ambulatory Visit | Attending: Cardiology | Admitting: Cardiology

## 2024-06-26 ENCOUNTER — Inpatient Hospital Stay (HOSPITAL_COMMUNITY)
Admission: RE | Admit: 2024-06-26 | Discharge: 2024-06-26 | Discharge status: Home / Self Care | Source: Ambulatory Visit | Attending: Cardiology | Admitting: Cardiology

## 2024-06-26 VITALS — BP 144/86 | HR 83 | Ht 64.0 in | Wt 230.8 lb

## 2024-06-26 DIAGNOSIS — I442 Atrioventricular block, complete: Secondary | ICD-10-CM | POA: Insufficient documentation

## 2024-06-26 DIAGNOSIS — R0602 Shortness of breath: Secondary | ICD-10-CM | POA: Insufficient documentation

## 2024-06-26 DIAGNOSIS — I5032 Chronic diastolic (congestive) heart failure: Secondary | ICD-10-CM

## 2024-06-26 DIAGNOSIS — Z7984 Long term (current) use of oral hypoglycemic drugs: Secondary | ICD-10-CM | POA: Insufficient documentation

## 2024-06-26 DIAGNOSIS — E785 Hyperlipidemia, unspecified: Secondary | ICD-10-CM | POA: Insufficient documentation

## 2024-06-26 DIAGNOSIS — Z89512 Acquired absence of left leg below knee: Secondary | ICD-10-CM | POA: Diagnosis not present

## 2024-06-26 DIAGNOSIS — I428 Other cardiomyopathies: Secondary | ICD-10-CM | POA: Diagnosis not present

## 2024-06-26 DIAGNOSIS — I5042 Chronic combined systolic (congestive) and diastolic (congestive) heart failure: Secondary | ICD-10-CM

## 2024-06-26 DIAGNOSIS — F17210 Nicotine dependence, cigarettes, uncomplicated: Secondary | ICD-10-CM | POA: Insufficient documentation

## 2024-06-26 DIAGNOSIS — R0683 Snoring: Secondary | ICD-10-CM | POA: Diagnosis not present

## 2024-06-26 DIAGNOSIS — I5022 Chronic systolic (congestive) heart failure: Secondary | ICD-10-CM | POA: Diagnosis not present

## 2024-06-26 DIAGNOSIS — I3139 Other pericardial effusion (noninflammatory): Secondary | ICD-10-CM | POA: Insufficient documentation

## 2024-06-26 DIAGNOSIS — R0600 Dyspnea, unspecified: Secondary | ICD-10-CM | POA: Diagnosis not present

## 2024-06-26 DIAGNOSIS — N183 Chronic kidney disease, stage 3 unspecified: Secondary | ICD-10-CM | POA: Diagnosis not present

## 2024-06-26 DIAGNOSIS — Z87891 Personal history of nicotine dependence: Secondary | ICD-10-CM | POA: Diagnosis not present

## 2024-06-26 DIAGNOSIS — Z6839 Body mass index (BMI) 39.0-39.9, adult: Secondary | ICD-10-CM | POA: Insufficient documentation

## 2024-06-26 DIAGNOSIS — E669 Obesity, unspecified: Secondary | ICD-10-CM | POA: Diagnosis not present

## 2024-06-26 DIAGNOSIS — I130 Hypertensive heart and chronic kidney disease with heart failure and stage 1 through stage 4 chronic kidney disease, or unspecified chronic kidney disease: Secondary | ICD-10-CM | POA: Diagnosis not present

## 2024-06-26 DIAGNOSIS — Z79899 Other long term (current) drug therapy: Secondary | ICD-10-CM | POA: Diagnosis not present

## 2024-06-26 DIAGNOSIS — Z8719 Personal history of other diseases of the digestive system: Secondary | ICD-10-CM | POA: Insufficient documentation

## 2024-06-26 DIAGNOSIS — E1122 Type 2 diabetes mellitus with diabetic chronic kidney disease: Secondary | ICD-10-CM | POA: Diagnosis not present

## 2024-06-26 DIAGNOSIS — Z794 Long term (current) use of insulin: Secondary | ICD-10-CM | POA: Diagnosis not present

## 2024-06-26 LAB — LIPID PANEL
Cholesterol: 180 mg/dL (ref 0–200)
HDL: 45 mg/dL
LDL Cholesterol: 94 mg/dL (ref 0–99)
Total CHOL/HDL Ratio: 4 ratio
Triglycerides: 207 mg/dL — ABNORMAL HIGH
VLDL: 41 mg/dL — ABNORMAL HIGH (ref 0–40)

## 2024-06-26 LAB — BRAIN NATRIURETIC PEPTIDE: B Natriuretic Peptide: 337.7 pg/mL — ABNORMAL HIGH (ref 0.0–100.0)

## 2024-06-26 LAB — BASIC METABOLIC PANEL WITH GFR
Anion gap: 13 (ref 5–15)
BUN: 21 mg/dL — ABNORMAL HIGH (ref 6–20)
CO2: 18 mmol/L — ABNORMAL LOW (ref 22–32)
Calcium: 8.8 mg/dL — ABNORMAL LOW (ref 8.9–10.3)
Chloride: 98 mmol/L (ref 98–111)
Creatinine, Ser: 1.56 mg/dL — ABNORMAL HIGH (ref 0.44–1.00)
GFR, Estimated: 40 mL/min — ABNORMAL LOW (ref >60)
Glucose, Bld: 303 mg/dL — ABNORMAL HIGH (ref 70–99)
Potassium: 4.2 mmol/L (ref 3.5–5.1)
Sodium: 129 mmol/L — ABNORMAL LOW (ref 135–145)

## 2024-06-26 LAB — ECHOCARDIOGRAM COMPLETE
Area-P 1/2: 4.99 cm2
Calc EF: 64.5 %
S' Lateral: 3.1 cm
Single Plane A2C EF: 54 %
Single Plane A4C EF: 71.9 %

## 2024-06-26 MED ORDER — PERFLUTREN LIPID MICROSPHERE
1.0000 mL | INTRAVENOUS | Status: DC | PRN
  Administered 2024-06-26: 1 mL via INTRAVENOUS

## 2024-06-26 MED ORDER — SPIRONOLACTONE 25 MG PO TABS: 12.5000 mg | ORAL_TABLET | Freq: Every day | ORAL | 3 refills | Status: AC

## 2024-06-26 MED ORDER — VARENICLINE TARTRATE 1 MG PO TABS: 1.0000 mg | ORAL_TABLET | Freq: Two times a day (BID) | ORAL | 2 refills | Status: AC

## 2024-06-26 NOTE — Patient Instructions (Signed)
 Medication Changes:  START SPIRONOLACTONE  12.5MG  ONCE DAILY   Lab Work:  Labs done today, your results will be available in MyChart, we will contact you for abnormal readings.  THEN REPEAT LABS IN 10 DAYS AS SCHEDULED   Testing/Procedures:  Your physician has recommended that you have a pulmonary function test. Pulmonary Function Tests are a group of tests that measure how well air moves in and out of your lungs.  Your physician has recommended that you have a sleep study. This test records several body functions during sleep, including: brain activity, eye movement, oxygen and carbon dioxide blood levels, heart rate and rhythm, breathing rate and rhythm, the flow of air through your mouth and nose, snoring, body muscle movements, and chest and belly movement. SCHEDULING WILL CALL TO ARRANGE THIS ONCE APPROVED WITH INSURANCE   LIMITED ECHOCARDIOGRAM AS SCHEDULED IN 6 WEEKS   Follow-Up in: 6 WEEKS AS SCHEDULED   At the [facility], you and your health needs are our priority. We have a designated team specialized in the treatment of Heart Failure. This Care Team includes your primary Heart Failure Specialized Cardiologist (physician), Advanced Practice Providers (APPs- Physician Assistants and Nurse Practitioners), and Pharmacist who all work together to provide you with the care you need, when you need it.   You may see any of the following providers on your designated Care Team at your next follow up:  Dr. Toribio Fuel Dr. Ezra Shuck Dr. Ria Commander Dr. Odis Brownie Greig Mosses, NP Caffie Shed, [place] [facility] [place], [place] Beckey Coe, NP [place] Lee, NP Tinnie Redman, PharmD   Please be sure to bring in all your medications bottles to every appointment.   Need to Contact Us :  If you have any questions or concerns before your next appointment please send us  a message through [place] or call our office at [phone].    TO LEAVE A  MESSAGE FOR THE NURSE SELECT OPTION 2, PLEASE LEAVE A MESSAGE INCLUDING: YOUR NAME DATE OF BIRTH CALL BACK NUMBER REASON FOR CALL**this is important as we prioritize the call backs  YOU WILL RECEIVE A CALL BACK THE SAME DAY AS LONG AS YOU CALL BEFORE 4:00 PM

## 2024-06-27 ENCOUNTER — Telehealth (HOSPITAL_COMMUNITY): Payer: Self-pay | Admitting: Vascular Surgery

## 2024-06-27 ENCOUNTER — Telehealth: Payer: Self-pay | Admitting: Family Medicine

## 2024-06-27 ENCOUNTER — Ambulatory Visit (HOSPITAL_COMMUNITY): Payer: Self-pay | Admitting: Cardiology

## 2024-06-27 DIAGNOSIS — E785 Hyperlipidemia, unspecified: Secondary | ICD-10-CM

## 2024-06-27 NOTE — Telephone Encounter (Signed)
 Lvm giving pft appt , asked pt to call back to confirm

## 2024-06-27 NOTE — Telephone Encounter (Signed)
 Copied from CRM [id number]. Topic: Clinical - Prescription Issue >> Jun 27, 2024 12:59 PM Tiffany S wrote:  Reason for CRM: isosorbide  mononitrate (ISMO ) 20 MG tablet [[id number]] This medication is no longer available for purchase   Wants to if provider will change to Isosorbide  Dinitrate 20mg    [phone] [facility]

## 2024-06-27 NOTE — Progress Notes (Signed)
 PCP: Delbert Clam, MD Endocrinology: Dr. Sam EP: Dr. Inocencio Cardiology: Dr. Rolan  Chief complaint: CHF  53 y.o. with history of type 2 diabetes, HTN, hyperlipidemia, complete heart block with PPM, and chronic diastolic CHF was referred by Dr. Brien for evaluation of CHF.  Patient has had diabetes since age 24 and has had HTN for at least 5 years.  She is a smoker and has a history of prior ETOH abuse with ETOH pancreatitis. In 3/21, she was admitted with DKA.  She was found to have associated complete heart block that did not resolve, she had St Jude PPM placed.  She had an echocardiogram showing EF 40-45% with severe LV hypertrophy.  She did not have a cardiac MRI prior to PPM placement.  Cath in 3/21 showed no significant CAD (nonischemic cardiomyopathy).   Echo (12/22): EF 70-75%, severe LVH, normal RV, mild to moderate MR.  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 She has a functioning orthotic.   cMRI (2/23) with LVEF 68%, severe LVH, ECV 54%, no LVOT gradient, poor nulling with uninterpretable delayed enhancement.   Follow up 5/23, NYHA II symptoms, volume overloaded. Lasix  increase to 40 daily and genetic testing arranged to evaluate for TTR amyloidosis and hypertrophic cardiomyopathies. 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  Of note, heart problems run in family with mother and maternal grandparents having CHF.  Alpha galactosidase activity was low at 20.1.  She was seen for genetics counseling by Dr. Danford Pac who agreed with the diagnosis of Fabry's disease.   Echo 2/24 EF 65-70%, severe LVH, normal RV size and systolic function, GLS -6.4% (very abnormal), IVC normal.   Zio monitor in 3/24 showed 5 short SVT runs and rare PVCs.   She has been seen by Dr. Earnie Medicine in the Fabry's clinic at [facility], started on migalastat.   Seen in ED 10/02/23 with CP and SOB. CXR showed pulmonary edema, BNP 353. Hs Troponins 55>>51.  Given 40 mg IV Lasix  with improvement in symptoms. Home Lasix  increased to 40/20 and discharged home.  Echo was done today and reviewed, EF 60-65%, severe LVH, normal RV, moderate pericardial effusion (no tamponade), IVC normal.   Today she returns for HF follow up. She takes 60-100 mg Lasix  daily depending on weight and symptoms.  She has cut back on smoking but has not quit.  She wants to restart Chantix  (ran out).  She is short of breath after walking 100-200 feet. Rare atypical right-sided chest pain. No lightheadedness or palpitations. She has daytime sleepiness and snores. Weight is down 6 lbs.   ECG (personally reviewed): NSR, RV pacing  REDS clip 32%  Labs (8/24): LDL 39 Labs (9/24): K 5.0, creatinine 1.74 Labs (10/24): K 4.0, creatinine 1.52 Labs (1/25): K 4.2, creatinine 1.36  Labs (6/25): K 4.4, creatinine 1.49  PMH: 1. Type 2 diabetes: Diagnosed at age 89.  2. HTN 3. Hyperlipidemia 4. Active smoker 5. H/o ETOH pancreatitis 6. Bipolar disorder 7. Complete heart block: 3/21, associate with DKA episode.  Has St Jude PPM.  8. Chronic systolic CHF: Nonischemic cardiomyopathy.  Echo (3/21) with EF 40-45%, severe LVH, mild MR, normal RV.  - LHC (3/21) normal coronaries.  - PYP scan (6/21): Negative for TTR cardiac amyloidosis.  - cMRI (2/23): LVEF 68%, severe LVH, ECV 54%, no LVOT gradient, poor nulling with uninterpretable delayed enhancement.  - CTA chest (7/23): No evidence for pulmonary sarcoidosis, no ILD.  - [employer]  gene testing with heterozygosity for GLA mutation linked to Fabry's disease.  - Alpha-galactosidase activity testing low (20.1) - Echo (2/24): EF 65-70%, severe LVH, normal RV size and systolic function, GLS -6.4% (very abnormal), IVC normal.  - Echo (7/25): EF 60-65%, severe LVH, normal RV, moderate pericardial effusion (no tamponade), IVC normal.  9. S/p left BKA (7/22). Has Phantom Limb pain. 10. CKD stage 3: Diabetic nephropathy, ?related to Fabry disease.   11. Zio monitor (3/24): 5 short SVT runs and rare PVCs.  SH: Divorced, works as a Lawyer, prior heavy ETOH now rare, cigarette smoker.  FH: Grandfather with CHF. No known sarcoidosis or amyloidosis.   ROS: All systems reviewed and negative except as per HPI.   Current Outpatient Medications  Medication Sig Dispense Refill   albuterol  (VENTOLIN  HFA) 108 (90 Base) MCG/ACT inhaler Inhale 2 puffs into the lungs every 6 (six) hours as needed for wheezing or shortness of breath. 8 g 2   ARIPiprazole  (ABILIFY ) 15 MG tablet Take 1 tablet (15 mg total) by mouth daily. 30 tablet 2   atorvastatin  (LIPITOR ) 20 MG tablet Take 1 tablet (20 mg total) by mouth daily. 90 tablet 3   Blood Glucose Monitoring Suppl (ACCU-CHEK GUIDE) w/Device KIT 1 Device by Does not apply route daily in the afternoon. 1 kit 0   Blood Pressure Monitoring (BLOOD PRESSURE CUFF) MISC Please monitor and check blood pressure daily please call the clinic if blood pressure is over 140 1 each 0   carvedilol  (COREG ) 12.5 MG tablet Take 1 tablet (12.5 mg total) by mouth 2 (two) times daily with a meal. 120 tablet 1   clotrimazole  (CLOTRIMAZOLE  AF) 1 % cream Apply 1 Application topically 2 (two) times daily. (Patient taking differently: Apply 1 Application topically as needed.) 30 g 1   Continuous Glucose Sensor (DEXCOM G7 SENSOR) MISC 1 Device by Does not apply route as directed. Every 10 days 9 each 3   dapagliflozin  propanediol (FARXIGA ) 10 MG TABS tablet Take 1 tablet (10 mg total) by mouth daily before breakfast. 30 tablet 11   furosemide  (LASIX ) 20 MG tablet Take 100 mg by mouth daily.     GALAFOLD 123 MG CAPS Take by mouth. (Patient taking differently: Take 1 capsule by mouth every other day.)     glucose blood (ACCU-CHEK GUIDE TEST) test strip 1 each by Other route daily in the afternoon. Use as instructed 100 each 12   hydrALAZINE  (APRESOLINE ) 50 MG tablet Take one three times daily 180 tablet 1   hydrOXYzine  (ATARAX ) 25 MG tablet  Take 25 mg by mouth daily as needed. (Patient taking differently: Take 25 mg by mouth as needed.)     insulin  glargine (LANTUS  SOLOSTAR) 100 UNIT/ML Solostar Pen Inject 40 Units into the skin daily. 45 mL 3   insulin  lispro (HUMALOG ) 100 UNIT/ML KwikPen Inject 14 Units into the skin 3 (three) times daily. Before meals daily 45 mL 4   Insulin  Pen Needle 31G X 5 MM MISC Use as directed in the morning, at noon, in the evening, and at bedtime. 100 each 3   isosorbide  mononitrate (ISMO ) 20 MG tablet Take 1 tablet (20 mg total) by mouth daily. 90 tablet 3   Misc. Devices MISC Weighing Scale. Diagnosis: CHF 1 each 0   Silver (TEGADERM AG MESH) 4X5 PADS Apply 1 Device topically daily in the afternoon. 50 each 3   valsartan  (DIOVAN ) 320 MG tablet Take 1 tablet (320 mg total) by mouth daily. 90 tablet 3  spironolactone  (ALDACTONE ) 25 MG tablet Take 0.5 tablets (12.5 mg total) by mouth daily. 45 tablet 3   varenicline  (CHANTIX  CONTINUING MONTH PAK) 1 MG tablet Take 1 tablet (1 mg total) by mouth 2 (two) times daily. 60 tablet 2   No current facility-administered medications for this encounter.   Wt Readings from Last 3 Encounters:  06/26/24 104.7 kg (230 lb 12.8 oz)  05/29/24 106.1 kg (234 lb)  05/23/24 107 kg (236 lb)   BP (!) 144/86   Pulse 83   Ht 5' 4 (1.626 m)   Wt 104.7 kg (230 lb 12.8 oz)   SpO2 100%   BMI 39.62 kg/m  General: NAD Neck: Thick. No JVD, no thyromegaly or thyroid  nodule.  Lungs: Clear to auscultation bilaterally with normal respiratory effort. CV: Nondisplaced PMI.  Heart regular S1/S2, no S3/S4, no murmur.  Trace right ankle edema.  No carotid bruit.  Normal pedal pulses.  Abdomen: Soft, nontender, no hepatosplenomegaly, no distention.  Skin: Intact without lesions or rashes.  Neurologic: Alert and oriented x 3.  Psych: Normal affect. Extremities: No clubbing or cyanosis.  S/p left BKA.  HEENT: Normal.   Assessment/Plan: 1. Chronic systolic CHF: Echo in 3/21 with  EF 40-45%, severe LV hypertrophy.  This was found in association with complete heart block.  Nonischemic cardiomyopathy, cath in 3/21 showed no significant CAD.  PYP scan (6/21) not suggestive of TTR amyloidosis. cMRI (2/23) showed LVEF 68%, severe LVH, ECV 54%, no LVOT gradient, poor nulling with uninterpretable delayed enhancement. CTA chest did not show evidence for pulmonary sarcoidosis.  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  There is wide phenotypic variation in female Fabry's heterozygotes, but I suspect that the cardiomyopathy with LVH and h/o complete heart block in this situation is most likely due to Fabry's disease. Echo 2/24 showed EF 65-70%, severe LVH, normal RV size and systolic function, GLS -6.4% (very abnormal), IVC normal.  She has seen Dr. Earnie Medicine at [facility] for [facility] evaluation. She is now on migalastat, an alpha-galactosidase A pharmacological chaperone.  Her alpha-gal variant leads to abnormal folding/less stable enzyme amenable to treatment with migalastat which should stabilize with enzyme and direct it to lysosome. Echo today showed EF 60-65%, severe LVH, normal RV, moderate pericardial effusion (no tamponade), IVC normal.   NYHA class IIi symptoms chronically, but she is not volume overloaded by exam or REDS clip.  - Continue Farxiga  10 mg daily.  BMET and BNP today. - Continue Lasix  60-100 mg daily, she adjusts dosing.  - Continue hydralazine  50 mg tid + isosorbide  mononitrate 20 mg daily.  - Continue Coreg  12.5 mg bid. - Continue valsartan  320 mg daily.  - Add spironolactone  12.5 daily. BMET/BNP today, BMET in 10 days.  - Repeat limited echo in 6 wks to follow pericardial effusion.  2. HTN: BP elevated today.  - Add spironolactone  12.5 daily.  3. Hyperlipidemia: Continue atorvastatin . Check lipids today.  4. Smoking: Smokes 1-2 cigs/day.  - She wants to restart Chantix , will  give prescription.  - Dyspnea is out of proportion to degree of volume overload.  I will arrange for PFTs to assess for COPD/emphysema.  5. Type 2 diabetes: Followed by Endocrinology. She is on insulin . 6. Complete heart block: She has a St Jude PPM and is pacing her RV > 99% of the time.  This is likely related to Fabry's disease. 7. Obesity: Body mass  index is 39.62 kg/m. - Would avoid GLP-1 agonist with history of pancreatitis.  - Continue weight loss efforts. 8. OSA: Suspected.  - I will arrange for sleep study.   Follow up in 6 wks with APP.   I spent 31 minutes reviewing records, interviewing/examining patient, and managing orders.   Ezra Shuck  06/27/2024

## 2024-06-27 NOTE — Telephone Encounter (Signed)
 Routing to PCP for review.

## 2024-06-28 ENCOUNTER — Other Ambulatory Visit: Payer: Self-pay

## 2024-06-28 MED ORDER — ATORVASTATIN CALCIUM 40 MG PO TABS
40.0000 mg | ORAL_TABLET | Freq: Every day | ORAL | 3 refills | Status: AC
Start: 2024-06-28 — End: ?

## 2024-06-28 MED ORDER — ISOSORBIDE DINITRATE 20 MG PO TABS: 20.0000 mg | ORAL_TABLET | Freq: Two times a day (BID) | ORAL | 3 refills | Status: DC

## 2024-06-28 NOTE — Addendum Note (Signed)
 Addended by: Genieve Ramaswamy on: 06/28/2024 12:52 PM   Modules accepted: Orders

## 2024-06-28 NOTE — Telephone Encounter (Signed)
 Prescription  for isosorbide  dinitrate has been sent to her pharmacy.

## 2024-06-29 NOTE — Telephone Encounter (Signed)
Called patient and she is aware

## 2024-07-05 ENCOUNTER — Other Ambulatory Visit: Payer: Self-pay

## 2024-07-05 NOTE — Patient Instructions (Signed)
 Visit Information  [name] was given information about Medicaid Managed Care team care coordination services as a part of their [employer] Medicaid benefit.   If you would like to schedule transportation through your [employer] [facility] plan, please call the following number at least 2 days in advance of your appointment: [phone]  If you are experiencing a behavioral health crisis, call the [employer] [place]  Behavioral Health Crisis Line at [phone]). The line is available 24 hours a day, seven days a week.   [name] - following are the goals we discussed in your visit today:    Goals Addressed             This Visit's Progress    VBCI RN Care Plan   On track    Problems:  Chronic Disease Management support and education needs related to CHF and DMII  Goal: Over the next 30 days the Patient will demonstrate Improved adherence to prescribed treatment plan for DMII as evidenced by improved blood sugar readings towards goal (last A1c 10.0) take all medications exactly as prescribed and will call provider for medication related questions as evidenced by patient report of medication adherence    work with Child psychotherapist to address concerns related to utility bill related to the management of chronic disease as evidenced by review of electronic medical record and patient or social worker report     Obtain scale and begin weighing daily Open package for BP cuff and CBG monitor and begin checking daily Complete PFTs Schedule sleep study  Interventions:   Diabetes Interventions: Assessed patient's understanding of A1c goal: <7% Reviewed medications with patient and discussed importance of medication adherence Counseled on importance of regular laboratory monitoring as prescribed Discussed plans with patient for ongoing care management follow up and provided patient with direct contact information for care management  team Advised patient, providing education and rationale, to check cbg as advised by provider and record, calling endocrinology or PCP for findings outside established parameters Advised patient open package for glucometer and begin checking before meals and at bedtime. Discussed obtaining skin-tac liquid and using with Dexcom to see if that will help it stick better Advised patient to contact pharmacy regarding Farxiga  now that PA has been approved Lab Results  Component Value Date   HGBA1C 10.0 (A) 05/23/2024    Evaluation of current treatment plan related to chronic illness,  self-management and patient's adherence to plan as established by provider. Discussed plans with patient for ongoing care management follow up and provided patient with direct contact information for care management team  Patient Self-Care Activities:  Attend all scheduled provider appointments Call provider office for new concerns or questions  Take medications as prescribed   check blood sugar at prescribed times: as advised by provider check feet daily for cuts, sores or redness take the blood sugar log to all doctor visits drink 6 to 8 glasses of water  each day  Plan:  Telephone follow up appointment with care management team member scheduled for:  08/02/24 at 1 PM             Patient verbalizes understanding of instructions and care plan provided today and agrees to view in MyChart. Active MyChart status and patient understanding of how to access instructions and care plan via MyChart confirmed with patient.     Telephone follow up appointment with Managed Medicaid care management team member scheduled for: 08/02/24 at 1 PM  [name], RN MSN [facility]  [facility] Population Health RN Care Manager Direct Dial : [phone]  Fax: [phone]   Following is a copy of your plan of care:  There are no care plans that you recently modified to display for this patient.

## 2024-07-05 NOTE — Patient Outreach (Signed)
 Complex Care Management   Visit Note  07/05/2024  Name:  Sarah Long MRN: [id number] DOB: Dec 26, 1970  Situation: Referral received for Complex Care Management related to Heart Failure and Diabetes with Complications I obtained verbal consent from Patient.  Visit completed with Marcus Notice  on the phone  Background:   Past Medical History:  Diagnosis Date   Abnormal Pap smear 1998   Abnormal vaginal bleeding 12/19/2011   Acute urinary retention 06/26/2021   Arthritis    Bacterial infection    Bipolar 1 disorder (HCC)    Blister of second toe of left foot 11/21/2016   Candida vaginitis 07/2007   Depression    recently added wellbutrin-has not taken yet for bipolar   Diabetes in pregnancy    Diabetes mellitus    nph 20U qam and qpm, regular with meals   Diabetic ketoacidosis without coma associated with type 2 diabetes mellitus (HCC)    Fibroid    Galactorrhea of right breast 2008   GERD (gastroesophageal reflux disease)    H/O amenorrhea 07/2007   H/O dizziness 10/14/2011   H/O dysmenorrhea 2010   H/O menorrhagia 10/14/2011   H/O varicella    Headache(784.0)    Heavy vaginal bleeding due to contraceptive injection use 10/12/2011   Depo Provera    Herpes    Homelessness 11/19/2021   HSV-2 infection 01/03/2009   Hx: UTI (urinary tract infection) 2009   Hypertension    on aldomet   Hypoalbuminemia due to protein-calorie malnutrition (HCC) 06/26/2021   Increased BMI 2010   Irregular uterine bleeding 04/04/2012   Pt has mirena    Obesity 10/14/2011   Oligomenorrhea 07/2007   Osteomyelitis of great toe of right foot (HCC) 03/22/2022   Pelvic pain in female    Presence of permanent cardiac pacemaker    Preterm labor    PTSD (post-traumatic stress disorder)    Trichomonas    Yeast infection     Assessment: Patient Reported Symptoms:  Cognitive Cognitive Status: Able to follow simple commands, Alert and oriented to person, place, and time, Normal speech and language  skills Cognitive/Intellectual Conditions Management [RPT]: None reported or documented in medical history or problem list      Neurological Neurological Review of Symptoms: Not assessed    HEENT HEENT Symptoms Reported: Not assessed      Cardiovascular Cardiovascular Symptoms Reported: No symptoms reported Does patient have uncontrolled Hypertension?: Yes Is patient checking Blood Pressure at home?: No Cardiovascular Management Strategies: Medical device, Medication therapy, Weight management (Pacemaker) Cardiovascular Comment: Patient has had ECHO and f/u with cardiology since previous CMRN visit. She was started on Spironolactone  d/t elevated BP. She does have a BP monitor but has not opened the package yet, so she has not been checking at home. Advised to open her package and begin checking daily.  Respiratory Respiratory Symptoms Reported: Shortness of breath, Productive cough (At baseline per patient)    Endocrine Endocrine Symptoms Reported: Increased urination Is patient diabetic?: Yes Is patient checking blood sugars at home?: No Endocrine Comment: Order sent following previous CMRN visit for a glucometer, patient confirms she has received it but has not opened it. She continues to have issues with Dexcom sticking to her. She has not had a chance to get skin-tac liquid to see if that helps glucometer stick. Patient reports she has been having trouble getting Farxiga  d/t cost. Per chart review, PA was approved as of 06/27/24. Advised to reach back out to pharmacy now that PA has been  approved.  Gastrointestinal Gastrointestinal Symptoms Reported: Not assessed      Genitourinary Genitourinary Symptoms Reported: Not assessed    Integumentary Integumentary Symptoms Reported: Not assessed    Musculoskeletal Musculoskelatal Symptoms Reviewed: Not assessed        Psychosocial Psychosocial Symptoms Reported: Not assessed            05/23/2024   10:06 AM  Depression screen PHQ 2/9   Decreased Interest 2  Down, Depressed, Hopeless 2  PHQ - 2 Score 4  Altered sleeping 2  Tired, decreased energy 3  Change in appetite 2  Feeling bad or failure about yourself  0  Trouble concentrating 0  Moving slowly or fidgety/restless 0  Suicidal thoughts 0  PHQ-9 Score 11    There were no vitals filed for this visit.  Medications Reviewed Today     Reviewed by Arno Rosaline SQUIBB, RN (Registered Nurse) on 07/05/24 at 1303  Med List Status: <None>   Medication Order Taking? Sig Documenting Provider Last Dose Status Informant  albuterol  (VENTOLIN  HFA) 108 (90 Base) MCG/ACT inhaler [id number]  Inhale 2 puffs into the lungs every 6 (six) hours as needed for wheezing or shortness of breath. Newlin, Enobong, MD  Active   ARIPiprazole  (ABILIFY ) 15 MG tablet [id number]  Take 1 tablet (15 mg total) by mouth daily. Bahraini, Lauraine LABOR  Active   atorvastatin  (LIPITOR ) 40 MG tablet [id number]  Take 1 tablet (40 mg total) by mouth daily. Rolan Ezra RAMAN, MD  Active   Blood Glucose Monitoring Suppl (ACCU-CHEK GUIDE) w/Device KIT [id number]  1 Device by Does not apply route daily in the afternoon. Shamleffer, Donell Cardinal, MD  Active   Blood Pressure Monitoring (BLOOD PRESSURE CUFF) MISC [id number]  Please monitor and check blood pressure daily please call the clinic if blood pressure is over 140 [place], [place], [place]  Active   carvedilol  (COREG ) 12.5 MG tablet [id number]  Take 1 tablet (12.5 mg total) by mouth 2 (two) times daily with a meal. Brien Belvie BRAVO, MD  Active   clotrimazole  (CLOTRIMAZOLE  AF) 1 % cream [id number]  Apply 1 Application topically 2 (two) times daily.  Patient taking differently: Apply 1 Application topically as needed.   Newlin, Enobong, MD  Active   Continuous Glucose Sensor (DEXCOM G7 SENSOR) MISC [id number]  1 Device by Does not apply route as directed. Every 10 days Shamleffer, Ibtehal Jaralla, MD  Active   dapagliflozin  propanediol (FARXIGA ) 10 MG TABS tablet  [id number]  Take 1 tablet (10 mg total) by mouth daily before breakfast. Glena Harlene HERO, FNP  Active   furosemide  (LASIX ) 20 MG tablet [id number]  Take 100 mg by mouth daily. [provider]  Active   GALAFOLD 123 MG CAPS [id number]  Take by mouth.  Patient taking differently: Take 1 capsule by mouth every other day.   [provider]  Active   glucose blood (ACCU-CHEK GUIDE TEST) test strip [id number]  1 each by Other route daily in the afternoon. Use as instructed Shamleffer, Ibtehal Jaralla, MD  Active   hydrALAZINE  (APRESOLINE ) 50 MG tablet [id number]  Take one three times daily Brien Belvie BRAVO, MD  Active   hydrOXYzine  (ATARAX ) 25 MG tablet [id number]  Take 25 mg by mouth daily as needed.  Patient taking differently: Take 25 mg by mouth as needed.   [provider]  Active   insulin  glargine (LANTUS  SOLOSTAR) 100 UNIT/ML Solostar Pen [id number]  Inject 40 Units into the skin daily. Shamleffer, Ibtehal Jaralla, MD  Active   insulin  lispro (HUMALOG ) 100 UNIT/ML KwikPen [id number]  Inject 14 Units into the skin 3 (three) times daily. Before meals daily Shamleffer, Ibtehal Jaralla, MD  Active   Insulin  Pen Needle 31G X 5 MM MISC [id number]  Use as directed in the morning, at noon, in the evening, and at bedtime. Shamleffer, Ibtehal Jaralla, MD  Active   isosorbide  dinitrate (ISORDIL ) 20 MG tablet [id number]  Take 1 tablet (20 mg total) by mouth 2 (two) times daily. Newlin, Enobong, MD  Active   Misc. Devices MISC [id number]  Weighing Scale. Diagnosis: CHF Delbert Clam, MD  Active   Silver (TEGADERM AG MESH) 4X5 PADS [id number]  Apply 1 Device topically daily in the afternoon. Shamleffer, Ibtehal Jaralla, MD  Active   spironolactone  (ALDACTONE ) 25 MG tablet [id number] Yes Take 0.5 tablets (12.5 mg total) by mouth daily. Rolan Ezra RAMAN, MD  Active   valsartan  (DIOVAN ) 320 MG tablet [id number]  Take 1 tablet (320 mg total) by mouth daily. Brien Belvie BRAVO, MD  Active    varenicline  (CHANTIX  CONTINUING MONTH PAK) 1 MG tablet [id number]  Take 1 tablet (1 mg total) by mouth 2 (two) times daily. Rolan Ezra RAMAN, MD  Active             Recommendation:   Continue Current Plan of Care  Follow Up Plan:   Telephone follow up appointment date/time:  08/02/24 at 1 PM  [name], RN MSN [place]  [facility] RN Care Manager Direct Dial : [phone]  Fax: [phone]

## 2024-07-09 ENCOUNTER — Other Ambulatory Visit (HOSPITAL_COMMUNITY)

## 2024-07-09 ENCOUNTER — Other Ambulatory Visit: Payer: Self-pay

## 2024-07-09 NOTE — Patient Outreach (Signed)
 BSW made multipel attempts to reach patient over the phone. Patient did not answer and VM was left requesting to call back to reschedule. BSW will attempt outreach again on 07/18/2024 at 2:30pm.

## 2024-07-12 DIAGNOSIS — E1129 Type 2 diabetes mellitus with other diabetic kidney complication: Secondary | ICD-10-CM | POA: Diagnosis not present

## 2024-07-12 DIAGNOSIS — Z794 Long term (current) use of insulin: Secondary | ICD-10-CM | POA: Diagnosis not present

## 2024-07-12 DIAGNOSIS — E7521 Fabry (-Anderson) disease: Secondary | ICD-10-CM | POA: Diagnosis not present

## 2024-07-12 DIAGNOSIS — I442 Atrioventricular block, complete: Secondary | ICD-10-CM | POA: Diagnosis not present

## 2024-07-12 DIAGNOSIS — R809 Proteinuria, unspecified: Secondary | ICD-10-CM | POA: Diagnosis not present

## 2024-07-12 DIAGNOSIS — I5022 Chronic systolic (congestive) heart failure: Secondary | ICD-10-CM | POA: Diagnosis not present

## 2024-07-17 ENCOUNTER — Inpatient Hospital Stay (HOSPITAL_COMMUNITY): Admission: RE | Admit: 2024-07-17 | Source: Ambulatory Visit

## 2024-07-18 ENCOUNTER — Other Ambulatory Visit: Payer: Self-pay

## 2024-07-18 NOTE — Patient Outreach (Signed)
 BSW attempted multiple calls during scheduled appt with no success. No option to leave VM. BSW will try again 07/20/2024 at 9AM.

## 2024-07-20 ENCOUNTER — Other Ambulatory Visit: Payer: Self-pay

## 2024-07-20 NOTE — Patient Outreach (Signed)
 Complex Care Management   Visit Note  07/20/2024  Name:  Sarah Long MRN: [id number] DOB: 09-04-1971  Situation: Referral received for Complex Care Management related to SDOH Barriers:  Food insecurity Utility assistance I obtained verbal consent from Patient.  Visit completed with patient  on the phone  Background:   Past Medical History:  Diagnosis Date   Abnormal Pap smear 1998   Abnormal vaginal bleeding 12/19/2011   Acute urinary retention 06/26/2021   Arthritis    Bacterial infection    Bipolar 1 disorder (HCC)    Blister of second toe of left foot 11/21/2016   Candida vaginitis 07/2007   Depression    recently added wellbutrin-has not taken yet for bipolar   Diabetes in pregnancy    Diabetes mellitus    nph 20U qam and qpm, regular with meals   Diabetic ketoacidosis without coma associated with type 2 diabetes mellitus (HCC)    Fibroid    Galactorrhea of right breast 2008   GERD (gastroesophageal reflux disease)    H/O amenorrhea 07/2007   H/O dizziness 10/14/2011   H/O dysmenorrhea 2010   H/O menorrhagia 10/14/2011   H/O varicella    Headache(784.0)    Heavy vaginal bleeding due to contraceptive injection use 10/12/2011   Depo Provera    Herpes    Homelessness 11/19/2021   HSV-2 infection 01/03/2009   Hx: UTI (urinary tract infection) 2009   Hypertension    on aldomet   Hypoalbuminemia due to protein-calorie malnutrition (HCC) 06/26/2021   Increased BMI 2010   Irregular uterine bleeding 04/04/2012   Pt has mirena    Obesity 10/14/2011   Oligomenorrhea 07/2007   Osteomyelitis of great toe of right foot (HCC) 03/22/2022   Pelvic pain in female    Presence of permanent cardiac pacemaker    Preterm labor    PTSD (post-traumatic stress disorder)    Trichomonas    Yeast infection     Assessment:  BSW attempted 3rd outreach attempt with no success. VM was left requesting call back. BSW will not continue outreach efforts at this time.    Recommendation:    none  Follow Up Plan:   We have been unable to make contact with the patient x 3 attempts. Closing from Complex Care Management  Laymon Doll, [place]/VBCI - [facility] Social Worker [phone]

## 2024-07-20 NOTE — Patient Instructions (Signed)
 Visit Information  [name] was given information about Medicaid Managed Care team care coordination services as a part of their [employer] Medicaid benefit.   If you would like to schedule transportation through your [facility] plan, please call the following number at least 2 days in advance of your appointment: [phone]  If you are experiencing a behavioral health crisis, call the [employer] [place]  Behavioral Health Crisis Line at [phone]). The line is available 24 hours a day, seven days a week.   [name] - following are the goals we discussed in your visit today:    Goals Addressed             This Visit's Progress    COMPLETED: BSW VBCI Social Work Care Plan       Problems:   Food Insecurity  and utility assistance  CSW Clinical Goal(s):   Over the next 3 weeks the Patient will work with Child psychotherapist to address concerns related to food insecurity and utility assistance.  Interventions:  BSW will provide patient food referrals for food pantries. BSW provided patient education on extra benefits/value-added benefits for household through children's health insurance; specifically utility assistance through the [employer] with [place].   Patient Goals/Self-Care Activities:  Call [employer] [place] regarding extra-value benefits for food and utility assistance.  Plan:   Telephone follow up appointment with care management team member scheduled for:  07/09/2024 at 2:30pm        Patient verbalizes understanding of instructions and care plan provided today and agrees to view in MyChart. Active MyChart status and patient understanding of how to access instructions and care plan via MyChart confirmed with patient.     No further follow up required: BSW has attempted 3 outreach attempts with no success.   Laymon Doll, BSW [place]/VBCI - [employer] Social  Worker [phone]   Following is a copy of your plan of care:  There are no care plans that you recently modified to display for this patient.

## 2024-07-24 ENCOUNTER — Other Ambulatory Visit: Payer: Self-pay

## 2024-07-24 ENCOUNTER — Ambulatory Visit (HOSPITAL_COMMUNITY): Admitting: Mental Health

## 2024-07-24 DIAGNOSIS — Z789 Other specified health status: Secondary | ICD-10-CM

## 2024-07-24 DIAGNOSIS — Z7189 Other specified counseling: Secondary | ICD-10-CM

## 2024-07-24 NOTE — Patient Outreach (Signed)
 Complex Care Management   Visit Note  07/24/2024  Name:  Sarah Long MRN: [id number] DOB: 29-Oct-1971  Situation: Referral received for Complex Care Management related to SDOH Barriers:  Transportation Food insecurity Utility assistance need I obtained verbal consent from Patient.  Visit completed with Patient  on the phone  Background:   Past Medical History:  Diagnosis Date   Abnormal Pap smear 1998   Abnormal vaginal bleeding 12/19/2011   Acute urinary retention 06/26/2021   Arthritis    Bacterial infection    Bipolar 1 disorder (HCC)    Blister of second toe of left foot 11/21/2016   Candida vaginitis 07/2007   Depression    recently added wellbutrin-has not taken yet for bipolar   Diabetes in pregnancy    Diabetes mellitus    nph 20U qam and qpm, regular with meals   Diabetic ketoacidosis without coma associated with type 2 diabetes mellitus (HCC)    Fibroid    Galactorrhea of right breast 2008   GERD (gastroesophageal reflux disease)    H/O amenorrhea 07/2007   H/O dizziness 10/14/2011   H/O dysmenorrhea 2010   H/O menorrhagia 10/14/2011   H/O varicella    Headache(784.0)    Heavy vaginal bleeding due to contraceptive injection use 10/12/2011   Depo Provera    Herpes    Homelessness 11/19/2021   HSV-2 infection 01/03/2009   Hx: UTI (urinary tract infection) 2009   Hypertension    on aldomet   Hypoalbuminemia due to protein-calorie malnutrition (HCC) 06/26/2021   Increased BMI 2010   Irregular uterine bleeding 04/04/2012   Pt has mirena    Obesity 10/14/2011   Oligomenorrhea 07/2007   Osteomyelitis of great toe of right foot (HCC) 03/22/2022   Pelvic pain in female    Presence of permanent cardiac pacemaker    Preterm labor    PTSD (post-traumatic stress disorder)    Trichomonas    Yeast infection     Assessment: BSW held f/u appt with pt. Pt was alert and cognitive. Pt reports she was able to get her [employer] bill paid in full through assistance  from the Crisis Intervention Program with [facility] DSS. Pt confirmed she has food resources and plans to access mobile distribution market today at 2pm. Pt reports she recently re certified for FNS after receiving a small increase in her SSDI. Referral for pharmacy call was submitted regarding confusion around medications. BSW and patient completed part A of SCAT application and will send to [facility] [place]. Part B will be sent to PCP for completion with pt. Pt agreed and understood. No additional resources were provided/requested at this time.   SDOH Interventions    Flowsheet Row Patient Outreach Telephone from 07/24/2024 in [place] POPULATION HEALTH DEPARTMENT Patient Outreach Telephone from 06/15/2024 in [place] POPULATION HEALTH DEPARTMENT Patient Outreach Telephone from 05/17/2024 in [place] POPULATION HEALTH DEPARTMENT Patient Outreach Telephone from 12/29/2023 in [place] [facility] POPULATION HEALTH DEPARTMENT Office Visit from 12/22/2023 in [facility] [place] [facility] [place] Telephone from 10/19/2023 in [facility]  SDOH Interventions        Food Insecurity Interventions Community Resources Provided AMB Referral  [Patient's son just turned 18 so the amount of food stamps she is receiving are changing] Intervention Not Indicated  [Patient gets food stamps and utilizes food pantries] --  [Pt denied needing food resources, pt is on EBT] Intervention Not Indicated --  Housing Interventions Intervention Not Indicated Intervention Not Indicated  [Patient prioritizes paying her rent. She gets Section 8] Intervention Not Indicated  [Section 8] Patient Declined  [place] is on Section 8 and declined needing housing resources but was previously homeless before receiving this voucher] AMB Referral --  Transportation Interventions Payor Benefit, Patient Resources (Friends/Family)  [patient has own transportation but is not as  reliable. patient is aware on how to set up transportation for medicaids for herself and childern.] -- -- Payor Benefit Intervention Not Indicated --  Utilities Interventions Community Resources Provided  [Patient recently received assistance with PD bill for [employer] and full PD balance was paid by CIP program at DSS>] AMB Referral  [Patient has utility bill due next week that she is worried about paying. She has a fixed rate that she has been paying. Referral to BSW for assistance] Intervention Not Indicated  [Patient reports she recently got a letter from social security for a discounted rate] [employer] Provided  [Resources emailed] AMB Referral --  Financial Strain Interventions -- -- -- -- Other (Comment)  [amb referral] Other (Comment)  [LIE[employer] information and [employer]]  Physical Activity Interventions -- -- -- -- Intervention Not Indicated --  Stress Interventions -- -- -- [employer] Provided, Provide Counseling Intervention Not Indicated --  Social Connections Interventions -- -- -- -- Intervention Not Indicated --  Health Literacy Interventions -- -- -- -- Intervention Not Indicated --    Recommendation:   None  Follow Up Plan:   Telephone follow up appointment date/time:  08/07/2024 at 10:45AM  Laymon Doll, BSW [place]/VBCI - [employer] Social Worker [phone]

## 2024-07-24 NOTE — Patient Instructions (Signed)
 Visit Information  [name] was given information about Medicaid Managed Care team care coordination services as a part of their [employer] Medicaid benefit.   If you would like to schedule transportation through your [facility] [facility] plan, please call the following number at least 2 days in advance of your appointment: [phone]  If you are experiencing a behavioral health crisis, call the [employer] [place]  Behavioral Health Crisis Line at [phone]). The line is available 24 hours a day, seven days a week.   [name] - following are the goals we discussed in your visit today:    Goals Addressed   None     Patient verbalizes understanding of instructions and care plan provided today and agrees to view in MyChart. Active MyChart status and patient understanding of how to access instructions and care plan via MyChart confirmed with patient.     Telephone follow up appointment with Managed Medicaid care management team member scheduled for: 08/07/2024 at 10:45AM  Laymon Doll, BSW [place]/VBCI - [facility] Social Worker [phone]   Following is a copy of your plan of care:  There are no care plans that you recently modified to display for this patient.

## 2024-07-25 NOTE — Patient Outreach (Signed)
 BSW faxed part B of SCAT application to patient PCP for completion and requested for application to be sent to [facility] [place] upon completion.

## 2024-07-26 ENCOUNTER — Other Ambulatory Visit (HOSPITAL_BASED_OUTPATIENT_CLINIC_OR_DEPARTMENT_OTHER): Payer: Self-pay | Admitting: Pharmacist

## 2024-07-26 ENCOUNTER — Encounter: Payer: Self-pay | Admitting: Pharmacist

## 2024-07-26 DIAGNOSIS — Z79899 Other long term (current) drug therapy: Secondary | ICD-10-CM

## 2024-07-26 NOTE — Progress Notes (Signed)
 07/26/2024 Name: Sarah Long MRN: [id number] DOB: August 08, 1971  No chief complaint on file.   Sarah Long is a 53 y.o. year old female who presented for a telephone visit.   They were referred to the pharmacist by their Case Management Team  for assistance in managing complex medication management.    Subjective:  Care Team: Primary Care Provider: Delbert Clam, MD ; Next Scheduled Visit: 08/23/2024  Medication Access/Adherence  Current Pharmacy:  [facility] - [place], [place] - [address] [place] [address] Phone: [phone] Fax: [phone]  Patient reports affordability concerns with their medications: No  Patient reports access/transportation concerns to their pharmacy: No  Patient reports adherence concerns with their medications:  Yes  - she tells me today she stopped all medication. She has plenty of her medicines on hand but she became overwhelmed with the amount of medication she takes and decided to stop everything. She is not wanting to restart therapy and asks me which ones she should take.    Medication Management:  Current adherence strategy:   Patient reports Poor adherence to medications  Patient reports the following barriers to adherence: becomes overwhelmed with the amount of medication she has to take   Objective:  Lab Results  Component Value Date   HGBA1C 10.0 (A) 05/23/2024    Lab Results  Component Value Date   CREATININE 1.56 (H) 06/26/2024   BUN 21 (H) 06/26/2024   NA 129 (L) 06/26/2024   K 4.2 06/26/2024   CL 98 06/26/2024   CO2 18 (L) 06/26/2024    Lab Results  Component Value Date   CHOL 180 06/26/2024   HDL 45 06/26/2024   LDLCALC 94 06/26/2024   TRIG 207 (H) 06/26/2024   CHOLHDL 4.0 06/26/2024    Medications Reviewed Today     Reviewed by Fleeta Tonia Garnette LITTIE, RPH-CPP (Pharmacist) on 07/26/24 at 1633  Med List Status: <None>   Medication Order Taking? Sig Documenting  Provider Last Dose Status Informant  albuterol  (VENTOLIN  HFA) 108 (90 Base) MCG/ACT inhaler [id number]  Inhale 2 puffs into the lungs every 6 (six) hours as needed for wheezing or shortness of breath. Newlin, Enobong, MD  Active   ARIPiprazole  (ABILIFY ) 15 MG tablet [id number]  Take 1 tablet (15 mg total) by mouth daily.  Patient not taking: Reported on 07/26/2024   Mercy Lauraine LABOR  Active   atorvastatin  (LIPITOR ) 40 MG tablet [id number]  Take 1 tablet (40 mg total) by mouth daily.  Patient not taking: Reported on 07/26/2024   Rolan Ezra RAMAN, MD  Active     Discontinued 07/26/24 [phone] (Duplicate)    Patient not taking:   Discontinued 07/26/24 1632 (Duplicate)   carvedilol  (COREG ) 12.5 MG tablet [id number]  Take 1 tablet (12.5 mg total) by mouth 2 (two) times daily with a meal.  Patient not taking: Reported on 07/26/2024   Brien Belvie BRAVO, MD  Active    Patient taking differently:   Discontinued 07/26/24 1632 (Completed Course)   Continuous Glucose Sensor (DEXCOM G7 SENSOR) MISC [id number]  1 Device by Does not apply route as directed. Every 10 days Shamleffer, Ibtehal Jaralla, MD  Active   dapagliflozin  propanediol (FARXIGA ) 10 MG TABS tablet [id number]  Take 1 tablet (10 mg total) by mouth daily before breakfast.  Patient not taking: Reported on 07/26/2024   Glena Harlene HERO, FNP  Active   furosemide  (LASIX ) 20 MG tablet [id number]  Take 100 mg by mouth  daily.  Patient not taking: Reported on 07/26/2024   [provider]  Active   GALAFOLD 123 MG CAPS [id number] Yes Take by mouth. [provider]  Active     Discontinued 07/26/24 1633 (Duplicate)   hydrALAZINE  (APRESOLINE ) 50 MG tablet [id number]  Take one three times daily  Patient not taking: Reported on 07/26/2024   Brien Belvie BRAVO, MD  Active   hydrOXYzine  (ATARAX ) 25 MG tablet [id number]  Take 25 mg by mouth daily as needed.  Patient taking differently: Take 25 mg by mouth as needed.   [provider]  Active    insulin  glargine (LANTUS  SOLOSTAR) 100 UNIT/ML Solostar Pen [id number]  Inject 40 Units into the skin daily.  Patient not taking: Reported on 07/26/2024   Shamleffer, Ibtehal Jaralla, MD  Active   insulin  lispro (HUMALOG ) 100 UNIT/ML KwikPen [id number]  Inject 14 Units into the skin 3 (three) times daily. Before meals daily  Patient not taking: Reported on 07/26/2024   Shamleffer, Ibtehal Jaralla, MD  Active   Insulin  Pen Needle 31G X 5 MM MISC [id number]  Use as directed in the morning, at noon, in the evening, and at bedtime.  Patient not taking: Reported on 07/26/2024   Shamleffer, Ibtehal Jaralla, MD  Active   isosorbide  dinitrate (ISORDIL ) 20 MG tablet [id number]  Take 1 tablet (20 mg total) by mouth 2 (two) times daily.  Patient not taking: Reported on 07/26/2024   Newlin, Enobong, MD  Active     Discontinued 07/26/24 1633 (Not available)   Silver [facility] AG MESH) 4X5 PADS [id number]  Apply 1 Device topically daily in the afternoon. Shamleffer, Ibtehal Jaralla, MD  Active   spironolactone  (ALDACTONE ) 25 MG tablet [id number]  Take 0.5 tablets (12.5 mg total) by mouth daily.  Patient not taking: Reported on 07/26/2024   Rolan Ezra RAMAN, MD  Active   valsartan  (DIOVAN ) 320 MG tablet [id number]  Take 1 tablet (320 mg total) by mouth daily.  Patient not taking: Reported on 07/26/2024   Brien Belvie BRAVO, MD  Active   varenicline  (CHANTIX  CONTINUING MONTH PAK) 1 MG tablet [id number]  Take 1 tablet (1 mg total) by mouth 2 (two) times daily.  Patient not taking: Reported on 07/26/2024   Rolan Ezra RAMAN, MD  Active               Assessment/Plan:   Medication Management: - Currently strategy insufficient to maintain appropriate adherence to prescribed medication regimen - Suggested use of weekly pill box to organize medications. She has medication on hand. Verbalizes understanding of doses and frequency.  - Patient agrees to restart medication at prescribed doses and follow-up with Dr.  Newlin next month.  - Created list of medication, indication, and administration time. Provided to patient - Discussed collaboration with local pharmacies for adherence packaging.   Follow Up Plan: w/ PCP next month.  Herlene Fleeta Morris, PharmD, JAQUELINE, CPP Clinical Pharmacist [facility] [phone]

## 2024-07-30 DIAGNOSIS — F1020 Alcohol dependence, uncomplicated: Secondary | ICD-10-CM | POA: Diagnosis not present

## 2024-08-01 LAB — AMB RESULTS CONSOLE CBG: Glucose: 167

## 2024-08-01 NOTE — Progress Notes (Signed)
 Pt has a pcp and medicaid for insurance. BP was 163/80 and glucose was 167mg /dl. Second BP reading 144/96.

## 2024-08-02 ENCOUNTER — Telehealth: Payer: Self-pay

## 2024-08-02 NOTE — Patient Outreach (Signed)
 Care Coordination   08/02/2024 Name: Sarah Long MRN: [id number] DOB: [birth year]   Care Coordination Outreach Attempts:  An unsuccessful outreach was attempted for an appointment today.  Follow Up Plan:  Additional outreach attempts will be made to complete CCM follow-up visit.   Encounter Outcome:  No Answer. HIPAA compliant voicemail left requesting return call.   [name], RN MSN [place]  VBCI Population Health RN Care Manager Direct Dial : [phone]  Fax: [phone]

## 2024-08-03 ENCOUNTER — Telehealth (HOSPITAL_COMMUNITY): Payer: Self-pay | Admitting: *Deleted

## 2024-08-03 NOTE — Progress Notes (Signed)
 PCP: Delbert Clam, MD Endocrinology: Dr. Sam EP: Dr. Inocencio Cardiology: Dr. Rolan  53 y.o. with history of type 2 diabetes, HTN, hyperlipidemia, complete heart block with PPM, and chronic diastolic CHF was referred by Dr. Brien for evaluation of CHF.  Patient has had diabetes since age 72 and has had HTN for at least 5 years.  She is a smoker and has a history of prior ETOH abuse with ETOH pancreatitis. In 3/21, she was admitted with DKA.  She was found to have associated complete heart block that did not resolve, she had St Jude PPM placed.  She had an echocardiogram showing EF 40-45% with severe LV hypertrophy.  She did not have a cardiac MRI prior to PPM placement.  Cath in 3/21 showed no significant CAD (nonischemic cardiomyopathy).   Echo (12/22): EF 70-75%, severe LVH, normal RV, mild to moderate MR.  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 She has a functioning orthotic.   cMRI (2/23) with LVEF 68%, severe LVH, ECV 54%, no LVOT gradient, poor nulling with uninterpretable delayed enhancement.   Follow up 5/23, NYHA II symptoms, volume overloaded. Lasix  increase to 40 daily and genetic testing arranged to evaluate for TTR amyloidosis and hypertrophic cardiomyopathies. 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  Of note, heart problems run in family with mother and maternal grandparents having CHF.  Alpha galactosidase activity was low at 20.1.  She was seen for genetics counseling by Dr. Danford Pac who agreed with the diagnosis of Fabry's disease.   Echo 2/24 EF 65-70%, severe LVH, normal RV size and systolic function, GLS -6.4% (very abnormal), IVC normal.   Zio monitor in 3/24 showed 5 short SVT runs and rare PVCs.   She has been seen by Dr. Earnie Medicine in the Fabry's clinic at [facility], started on migalastat.   Seen in ED 10/02/23 with CP and SOB. CXR showed pulmonary edema, BNP 353. Hs Troponins 55>>51. Given 40 mg IV Lasix   with improvement in symptoms. Home Lasix  increased to 40/20 and discharged home.  Echo 7/25 showed  EF 60-65%, severe LVH, normal RV, moderate pericardial effusion (no tamponade), IVC normal.   Today she returns for HF follow up. Overall feeling fine. Has occasional HA. Main complaint is yeast infection, has not had one in years.  She is not SOB walking on flat ground. 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 Denies palpitations, abnormal bleeding, CP, dizziness, edema, or PND/Orthopnea. Appetite ok. Weight at home 227-230 pounds. Taking all medications. Smoking 5-10 cigs/day. Has not started Chantix .  Ltd echo today 08/07/24 shows EF 65-70%, RV normal, trivial circumferential pericardial effusion, smaller compared to previous, IVC not dilated.  Device interrogation (personally reviewed): >99% VP, no AT/AF  ECG (personally reviewed): none ordered today  Labs (8/24): LDL 39 Labs (9/24): K 5.0, creatinine 1.74 Labs (10/24): K 4.0, creatinine 1.52 Labs (1/25): K 4.2, creatinine 1.36  Labs (6/25): K 4.4, creatinine 1.49 Labs (7/25):  K 4.2, creatinine 1.56, LDL 94  PMH: 1. Type 2 diabetes: Diagnosed at age 50.  2. HTN 3. Hyperlipidemia 4. Active smoker 5. H/o ETOH pancreatitis 6. Bipolar disorder 7. Complete heart block: 3/21, associate with DKA episode.  Has St Jude PPM.  8. Chronic systolic CHF: Nonischemic cardiomyopathy.  Echo (3/21) with EF 40-45%, severe LVH, mild MR, normal RV.  - LHC (3/21) normal coronaries.  - PYP scan (6/21): Negative for TTR cardiac amyloidosis.  - cMRI (  2/23): LVEF 68%, severe LVH, ECV 54%, no LVOT gradient, poor nulling with uninterpretable delayed enhancement.  - CTA chest (7/23): No evidence for pulmonary sarcoidosis, no ILD.  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  - Alpha-galactosidase activity testing low (20.1) - Echo (2/24): EF 65-70%, severe LVH, normal RV size and systolic  function, GLS -6.4% (very abnormal), IVC normal.  - Echo (7/25): EF 60-65%, severe LVH, normal RV, moderate pericardial effusion (no tamponade), IVC normal.  - Ltd echo (9/25): EF 65-70%, RV normal, trivial circumferential pericardial effusion, smaller compared to previous, IVC not dilated. 9. S/p left BKA (7/22). Has Phantom Limb pain. 10. CKD stage 3: Diabetic nephropathy, ?related to Fabry disease.  11. Zio monitor (3/24): 5 short SVT runs and rare PVCs.  SH: Divorced, works as a Lawyer, prior heavy ETOH now rare, cigarette smoker.  FH: Grandfather with CHF. No known sarcoidosis or amyloidosis.   ROS: All systems reviewed and negative except as per HPI.   Current Outpatient Medications  Medication Sig Dispense Refill   albuterol  (VENTOLIN  HFA) 108 (90 Base) MCG/ACT inhaler Inhale 2 puffs into the lungs every 6 (six) hours as needed for wheezing or shortness of breath. 8 g 2   ARIPiprazole  (ABILIFY ) 15 MG tablet Take 1 tablet (15 mg total) by mouth daily. 30 tablet 2   atorvastatin  (LIPITOR ) 40 MG tablet Take 1 tablet (40 mg total) by mouth daily. 90 tablet 3   carvedilol  (COREG ) 12.5 MG tablet Take 1 tablet (12.5 mg total) by mouth 2 (two) times daily with a meal. 120 tablet 1   Continuous Glucose Sensor (DEXCOM G7 SENSOR) MISC 1 Device by Does not apply route as directed. Every 10 days 9 each 3   dapagliflozin  propanediol (FARXIGA ) 10 MG TABS tablet Take 1 tablet (10 mg total) by mouth daily before breakfast. 30 tablet 11   fluconazole  (DIFLUCAN ) 150 MG tablet Take 1 tablet (150 mg total) by mouth daily. 1 tablet 0   furosemide  (LASIX ) 20 MG tablet Take 100 mg by mouth daily.     GALAFOLD 123 MG CAPS Take by mouth.     hydrALAZINE  (APRESOLINE ) 50 MG tablet Take one three times daily 180 tablet 1   hydrOXYzine  (ATARAX ) 25 MG tablet Take 25 mg by mouth daily as needed. (Patient taking differently: Take 25 mg by mouth as needed.)     insulin  glargine (LANTUS  SOLOSTAR) 100 UNIT/ML Solostar Pen  Inject 40 Units into the skin daily. 45 mL 3   insulin  lispro (HUMALOG ) 100 UNIT/ML KwikPen Inject 14 Units into the skin 3 (three) times daily. Before meals daily 45 mL 4   Insulin  Pen Needle 31G X 5 MM MISC Use as directed in the morning, at noon, in the evening, and at bedtime. 100 each 3   isosorbide  dinitrate (ISORDIL ) 20 MG tablet Take 1 tablet (20 mg total) by mouth 2 (two) times daily. 60 tablet 3   Silver (TEGADERM AG MESH) 4X5 PADS Apply 1 Device topically daily in the afternoon. 50 each 3   spironolactone  (ALDACTONE ) 25 MG tablet Take 0.5 tablets (12.5 mg total) by mouth daily. 45 tablet 3   valsartan  (DIOVAN ) 320 MG tablet Take 1 tablet (320 mg total) by mouth daily. 90 tablet 3   varenicline  (CHANTIX  CONTINUING MONTH PAK) 1 MG tablet Take 1 tablet (1 mg total) by mouth 2 (two) times daily. (Patient not taking: Reported on 08/07/2024) 60 tablet 2   No current facility-administered medications  for this encounter.   Wt Readings from Last 3 Encounters:  08/07/24 103.2 kg (227 lb 9.6 oz)  06/26/24 104.7 kg (230 lb 12.8 oz)  05/29/24 106.1 kg (234 lb)   BP (!) 136/98   Pulse 84   Ht 5' 4 (1.626 m)   Wt 103.2 kg (227 lb 9.6 oz)   SpO2 97%   BMI 39.07 kg/m  Physical Exam General:  NAD. No resp difficulty, walked into clinic HEENT: Normal Neck: Supple. No JVD. Thick neck Cor: Regular rate & rhythm. No rubs, gallops or murmurs. Lungs: Faint wheeze upper lobes Abdomen: Soft, obese, nontender, nondistended.  Extremities: No cyanosis, clubbing, rash, edema; s/p L BKA Neuro: Alert & oriented x 3, moves all 4 extremities w/o difficulty. Affect pleasant.  Assessment/Plan: 1. Chronic systolic CHF: Echo in 3/21 with EF 40-45%, severe LV hypertrophy.  This was found in association with complete heart block.  Nonischemic cardiomyopathy, cath in 3/21 showed no significant CAD.  PYP scan (6/21) not suggestive of TTR amyloidosis. cMRI (2/23) showed LVEF 68%, severe LVH, ECV 54%, no LVOT  gradient, poor nulling with uninterpretable delayed enhancement. CTA chest did not show evidence for pulmonary sarcoidosis.  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  There is wide phenotypic variation in female Fabry's heterozygotes, but I suspect that the cardiomyopathy with LVH and h/o complete heart block in this situation is most likely due to Fabry's disease. Echo 2/24 showed EF 65-70%, severe LVH, normal RV size and systolic function, GLS -6.4% (very abnormal), IVC normal.  She has seen Dr. Earnie Medicine at [facility] evaluation. She is now on migalastat, an alpha-galactosidase A pharmacological chaperone.  Her alpha-gal variant leads to abnormal folding/less stable enzyme amenable to treatment with migalastat which should stabilize with enzyme and direct it to lysosome. Echo 7/25 showed EF 60-65%, severe LVH, normal RV, moderate pericardial effusion (no tamponade), IVC normal.  Ltd echo today 08/07/24, EF 65-70%, RV normal, trivial circumferential pericardial effusion, smaller compared to previous, IVC not dilated. NYHA II symptoms chronically, she is not volume overloaded. - Continue Farxiga  10 mg daily. BMET and BNP today - Continue Lasix  60-100 mg daily, she adjusts dosing.  - Continue hydralazine  50 mg tid + isosorbide  dinitrate 20 mg bid.  - Continue Coreg  12.5 mg bid. - Continue valsartan  320 mg daily.  - Continue spironolactone  12.5 mg daily.   2. HTN: BP better today but still mildly elevated. She has not had her AM meds yet. - Sleep study has been arranged. 3. Hyperlipidemia: Continue atorvastatin  40 mg daily, recently increased with LDL 94. Repeat LFTs/lipids have been arranged. 4. Smoking: Smokes 5-10 cigs/day.  - She has Chantix , encouraged her to start. - PFTs arranged to assess for COPD/emphysema.  5. Type 2 diabetes: Followed by Endocrinology. She is on insulin . 6. Complete heart  block: She has a St Jude PPM and is pacing her RV > 99% of the time.  This is likely related to Fabry's disease. 7. Obesity: Body mass index is 39.07 kg/m. - Would avoid GLP-1 agonist with history of pancreatitis.  - Continue weight loss efforts. 8. OSA: Suspected.  - Sleep study has been arranged.    9. Yeast infection: will give fluconazole  150 mg x 1 dose, no interference with her Galafold. Needs PCP follow up if does not clear up. - If recurs, may need to stop SGLT2i.   Follow up in  4 months with Dr. Rolan Raisin [facility] FNP-BC 08/07/2024

## 2024-08-03 NOTE — Telephone Encounter (Signed)
 Called to confirm/remind patient of their appointment at [facility] on 08/07/24.       Appointment:              [x] Confirmed             [] Left mess              [] No answer/No voice mail             [] Phone not in service   Patient reminded to bring all medications and/or complete list.   Confirmed patient has transportation. Gave directions, instructed to utilize valet parking.

## 2024-08-07 ENCOUNTER — Ambulatory Visit (HOSPITAL_COMMUNITY)
Admission: RE | Admit: 2024-08-07 | Discharge: 2024-08-07 | Disposition: A | Discharge status: Home / Self Care | Source: Ambulatory Visit | Attending: Cardiology | Admitting: Cardiology

## 2024-08-07 ENCOUNTER — Ambulatory Visit (HOSPITAL_COMMUNITY): Payer: Self-pay | Admitting: Family Medicine

## 2024-08-07 ENCOUNTER — Telehealth (HOSPITAL_COMMUNITY): Payer: Self-pay | Admitting: *Deleted

## 2024-08-07 ENCOUNTER — Other Ambulatory Visit: Payer: Self-pay

## 2024-08-07 ENCOUNTER — Encounter (HOSPITAL_COMMUNITY): Payer: Self-pay

## 2024-08-07 ENCOUNTER — Ambulatory Visit (HOSPITAL_BASED_OUTPATIENT_CLINIC_OR_DEPARTMENT_OTHER)
Admission: RE | Admit: 2024-08-07 | Discharge: 2024-08-07 | Disposition: A | Discharge status: Home / Self Care | Source: Ambulatory Visit | Attending: Family Medicine | Admitting: Family Medicine

## 2024-08-07 VITALS — BP 136/98 | HR 84 | Ht 64.0 in | Wt 227.6 lb

## 2024-08-07 DIAGNOSIS — I10 Essential (primary) hypertension: Secondary | ICD-10-CM

## 2024-08-07 DIAGNOSIS — I5042 Chronic combined systolic (congestive) and diastolic (congestive) heart failure: Secondary | ICD-10-CM

## 2024-08-07 DIAGNOSIS — E1122 Type 2 diabetes mellitus with diabetic chronic kidney disease: Secondary | ICD-10-CM | POA: Diagnosis not present

## 2024-08-07 DIAGNOSIS — E785 Hyperlipidemia, unspecified: Secondary | ICD-10-CM | POA: Diagnosis not present

## 2024-08-07 DIAGNOSIS — E669 Obesity, unspecified: Secondary | ICD-10-CM

## 2024-08-07 DIAGNOSIS — R29818 Other symptoms and signs involving the nervous system: Secondary | ICD-10-CM

## 2024-08-07 DIAGNOSIS — I110 Hypertensive heart disease with heart failure: Secondary | ICD-10-CM | POA: Insufficient documentation

## 2024-08-07 DIAGNOSIS — Z794 Long term (current) use of insulin: Secondary | ICD-10-CM | POA: Diagnosis not present

## 2024-08-07 DIAGNOSIS — Z720 Tobacco use: Secondary | ICD-10-CM | POA: Diagnosis not present

## 2024-08-07 DIAGNOSIS — I442 Atrioventricular block, complete: Secondary | ICD-10-CM | POA: Insufficient documentation

## 2024-08-07 DIAGNOSIS — Z7984 Long term (current) use of oral hypoglycemic drugs: Secondary | ICD-10-CM | POA: Diagnosis not present

## 2024-08-07 DIAGNOSIS — I5022 Chronic systolic (congestive) heart failure: Secondary | ICD-10-CM | POA: Diagnosis not present

## 2024-08-07 DIAGNOSIS — I428 Other cardiomyopathies: Secondary | ICD-10-CM | POA: Insufficient documentation

## 2024-08-07 DIAGNOSIS — F17210 Nicotine dependence, cigarettes, uncomplicated: Secondary | ICD-10-CM | POA: Diagnosis not present

## 2024-08-07 DIAGNOSIS — E1169 Type 2 diabetes mellitus with other specified complication: Secondary | ICD-10-CM

## 2024-08-07 DIAGNOSIS — I081 Rheumatic disorders of both mitral and tricuspid valves: Secondary | ICD-10-CM | POA: Diagnosis not present

## 2024-08-07 DIAGNOSIS — Z79899 Other long term (current) drug therapy: Secondary | ICD-10-CM | POA: Diagnosis not present

## 2024-08-07 DIAGNOSIS — I130 Hypertensive heart and chronic kidney disease with heart failure and stage 1 through stage 4 chronic kidney disease, or unspecified chronic kidney disease: Secondary | ICD-10-CM | POA: Diagnosis not present

## 2024-08-07 LAB — BRAIN NATRIURETIC PEPTIDE: B Natriuretic Peptide: 618.8 pg/mL — ABNORMAL HIGH (ref 0.0–100.0)

## 2024-08-07 LAB — BASIC METABOLIC PANEL WITH GFR
Anion gap: 8 (ref 5–15)
BUN: 18 mg/dL (ref 6–20)
CO2: 23 mmol/L (ref 22–32)
Calcium: 9 mg/dL (ref 8.9–10.3)
Chloride: 103 mmol/L (ref 98–111)
Creatinine, Ser: 1.42 mg/dL — ABNORMAL HIGH (ref 0.44–1.00)
GFR, Estimated: 45 mL/min — ABNORMAL LOW (ref >60)
Glucose, Bld: 410 mg/dL — ABNORMAL HIGH (ref 70–99)
Potassium: 4.4 mmol/L (ref 3.5–5.1)
Sodium: 134 mmol/L — ABNORMAL LOW (ref 135–145)

## 2024-08-07 LAB — ECHOCARDIOGRAM LIMITED
Calc EF: 73.8 %
S' Lateral: 2.1 cm
Single Plane A2C EF: 79.7 %
Single Plane A4C EF: 67.6 %

## 2024-08-07 MED ORDER — FLUCONAZOLE 150 MG PO TABS: 150.0000 mg | ORAL_TABLET | Freq: Every day | ORAL | 0 refills | Status: AC

## 2024-08-07 NOTE — Patient Instructions (Signed)
 Take fluconazole  150 mg x one dose - Rx sent. No other medication changes. Labs today - will call you if abnormal. Please pick up your Farxiga  from pharmacy when available. Return to see Dr. Rolan in 4 months. PLEASE CALL [phone] IN DECEMBER TO SCHEDULE THIS APPOINTMENT. Please call us  at [phone] if any questions or concerns prior to your next appointment.

## 2024-08-07 NOTE — Telephone Encounter (Signed)
 Called patient per Harlene Gainer, NP with following lab results and instructions:  BNP creeping up, she has been more fatigued lately. She self adjusts her Lasix  anywhere from 60-100 mg daily, she is currently taking 60 mg daily, recommend she go to 80 mg daily.  Repeat BMET 10-14 days  Pt verbalized understanding. She will take Lasix  80 mg daily. Repeat lab ordered and scheduled.

## 2024-08-07 NOTE — Patient Outreach (Addendum)
 Attempted outreach attempts during scheduled appointment time without success. BSW left VM requesting call back to reschedule otherwise he will reach out again this afternoon to reschedule appointment for a different date/time. Pt was also advised over VM that BSW sent her an updated mobile food distribution schedule for the month of September for food resources.

## 2024-08-08 NOTE — Patient Outreach (Signed)
 Care Coordination   08/08/2024 Name: PHIL CORTI MRN: [id number] DOB: 1971-07-06   Care Coordination Outreach Attempts:  A second unsuccessful outreach was attempted today to complete CCM follow-up visit.  Follow Up Plan:  Additional outreach attempts will be made to complete follow-up visit.   Encounter Outcome:  No Answer. HIPAA compliant voicemail left requesting return call.   [name], RN MSN [place]  VBCI Population Health RN Care Manager Direct Dial : [phone]  Fax: [phone]

## 2024-08-10 ENCOUNTER — Other Ambulatory Visit: Payer: Self-pay

## 2024-08-10 DIAGNOSIS — F1020 Alcohol dependence, uncomplicated: Secondary | ICD-10-CM | POA: Diagnosis not present

## 2024-08-10 NOTE — Patient Outreach (Signed)
 Complex Care Management   Visit Note  08/10/2024  Name:  MOMINA HUNTON MRN: [id number] DOB: 13-Jul-1971  Situation: Referral received for Complex Care Management related to Heart Failure and Diabetes with Complications I obtained verbal consent from Patient.  Visit completed with Patient  on the phone  Background:   Past Medical History:  Diagnosis Date   Abnormal Pap smear 1998   Abnormal vaginal bleeding 12/19/2011   Acute urinary retention 06/26/2021   Arthritis    Bacterial infection    Bipolar 1 disorder (HCC)    Blister of second toe of left foot 11/21/2016   Candida vaginitis 07/2007   Depression    recently added wellbutrin-has not taken yet for bipolar   Diabetes in pregnancy    Diabetes mellitus    nph 20U qam and qpm, regular with meals   Diabetic ketoacidosis without coma associated with type 2 diabetes mellitus (HCC)    Fibroid    Galactorrhea of right breast 2008   GERD (gastroesophageal reflux disease)    H/O amenorrhea 07/2007   H/O dizziness 10/14/2011   H/O dysmenorrhea 2010   H/O menorrhagia 10/14/2011   H/O varicella    Headache(784.0)    Heavy vaginal bleeding due to contraceptive injection use 10/12/2011   Depo Provera    Herpes    Homelessness 11/19/2021   HSV-2 infection 01/03/2009   Hx: UTI (urinary tract infection) 2009   Hypertension    on aldomet   Hypoalbuminemia due to protein-calorie malnutrition (HCC) 06/26/2021   Increased BMI 2010   Irregular uterine bleeding 04/04/2012   Pt has mirena    Obesity 10/14/2011   Oligomenorrhea 07/2007   Osteomyelitis of great toe of right foot (HCC) 03/22/2022   Pelvic pain in female    Presence of permanent cardiac pacemaker    Preterm labor    PTSD (post-traumatic stress disorder)    Trichomonas    Yeast infection     Assessment: Patient Reported Symptoms:  Cognitive Cognitive Status: Able to follow simple commands, Alert and oriented to person, place, and time, Normal speech and language  skills Cognitive/Intellectual Conditions Management [RPT]: None reported or documented in medical history or problem list      Neurological Neurological Review of Symptoms: Numbness (Numbness R foot d/t neuropathy) Neurological Management Strategies: Routine screening, Medication therapy  HEENT HEENT Symptoms Reported: No symptoms reported HEENT Management Strategies: Routine screening Vision problem(s)  Cardiovascular Cardiovascular Symptoms Reported: No symptoms reported Does patient have uncontrolled Hypertension?: Yes Is patient checking Blood Pressure at home?: Yes Patient's Recent BP reading at home: 120/90 Cardiovascular Management Strategies: Medical device, Medication therapy (Pacemaker) Do You Have a Working Readable Scale?: No Cardiovascular Comment: Note that patient had f/u with cardiology since previous CMRN visit. Her lasix  was increased. Confirmed that patient has picked up increased dose of medication. Note that patient still has not received scale. Nothing noted in chart review regarding order/where it was sent. Message sent to PCP to ask for new order.  Respiratory Respiratory Symptoms Reported: Shortness of breath, Productive cough (Yellow phlegm production. At baseline per patient) Additional Respiratory Details: Patient continues to use PRN Albuterol  1-2 times a week Respiratory Management Strategies: Medication therapy, Routine screening  Endocrine Endocrine Symptoms Reported: Increased urination Is patient diabetic?: Yes Is patient checking blood sugars at home?: Yes List most recent blood sugar readings, include date and time of day: 2-3 times a day via fingerstick. 120 fasting Endocrine Comment: Patient reports she has been using glucometer to check her blood  sugar via fingerstick. She is not using Dexom at this time.  Gastrointestinal Gastrointestinal Symptoms Reported: Constipation, Diarrhea Additional Gastrointestinal Details: Last BM yesterday. She reports she  has constant constipation and had gone a week without a BM unti last night when she had diarrhea. She plans to discuss this with PCP at upcoming appointment. Gastrointestinal Management Strategies: Diet modification    Genitourinary Genitourinary Symptoms Reported: Other Other Genitourinary Symptoms: Increased urination Genitourinary Management Strategies: Coping strategies  Integumentary Integumentary Symptoms Reported: No symptoms reported    Musculoskeletal Musculoskelatal Symptoms Reviewed: Unsteady gait Musculoskeletal Management Strategies: Medical device, Adequate rest Falls in the past year?: Yes Number of falls in past year: 2 or more Was there an injury with Fall?: No Fall Risk Category Calculator: 2 Patient Fall Risk Level: Moderate Fall Risk Patient at Risk for Falls Due to: History of fall(s), Orthopedic patient Fall risk Follow up: Falls evaluation completed, Education provided  Psychosocial Psychosocial Symptoms Reported: No symptoms reported Additional Psychological Details: Patient continues to go to therapy and reports compliance with medications          08/10/2024    PHQ2-9 Depression Screening   Little interest or pleasure in doing things    Feeling down, depressed, or hopeless    PHQ-2 - Total Score    Trouble falling or staying asleep, or sleeping too much    Feeling tired or having little energy    Poor appetite or overeating     Feeling bad about yourself - or that you are a failure or have let yourself or your family down    Trouble concentrating on things, such as reading the newspaper or watching television    Moving or speaking so slowly that other people could have noticed.  Or the opposite - being so fidgety or restless that you have been moving around a lot more than usual    Thoughts that you would be better off dead, or hurting yourself in some way    PHQ2-9 Total Score    If you checked off any problems, how difficult have these problems made it  for you to do your work, take care of things at home, or get along with other people    Depression Interventions/Treatment      There were no vitals filed for this visit.  Medications Reviewed Today     Reviewed by Arno Rosaline SQUIBB, RN (Registered Nurse) on 08/10/24 at 1553  Med List Status: <None>   Medication Order Taking? Sig Documenting Provider Last Dose Status Informant  albuterol  (VENTOLIN  HFA) 108 (90 Base) MCG/ACT inhaler [id number] Yes Inhale 2 puffs into the lungs every 6 (six) hours as needed for wheezing or shortness of breath. Newlin, Enobong, MD  Active   ARIPiprazole  (ABILIFY ) 15 MG tablet [id number] Yes Take 1 tablet (15 mg total) by mouth daily. Bahraini, Lauraine LABOR  Active   atorvastatin  (LIPITOR ) 40 MG tablet [id number] Yes Take 1 tablet (40 mg total) by mouth daily. Rolan Ezra RAMAN, MD  Active   carvedilol  (COREG ) 12.5 MG tablet [id number] Yes Take 1 tablet (12.5 mg total) by mouth 2 (two) times daily with a meal. Brien Belvie BRAVO, MD  Active   Continuous Glucose Sensor (DEXCOM G7 SENSOR) MISC [id number]  1 Device by Does not apply route as directed. Every 10 days  Patient not taking: Reported on 08/10/2024   Shamleffer, Ibtehal Jaralla, MD  Active   dapagliflozin  propanediol (FARXIGA ) 10 MG TABS tablet [id number] Yes Take 1 tablet (10 mg total)  by mouth daily before breakfast. Glena Harlene HERO, FNP  Active   fluconazole  (DIFLUCAN ) 150 MG tablet [id number]  Take 1 tablet (150 mg total) by mouth daily.  Patient not taking: Reported on 08/10/2024   Glena Harlene HERO, FNP  Consider Medication Status and Discontinue (Completed Course)   furosemide  (LASIX ) 20 MG tablet [id number] Yes Take 80 mg by mouth daily. [provider]  Active   GALAFOLD 123 MG CAPS [id number] Yes Take by mouth. [provider]  Active   hydrALAZINE  (APRESOLINE ) 50 MG tablet [id number] Yes Take one three times daily Brien Belvie BRAVO, MD  Active   hydrOXYzine  (ATARAX ) 25 MG tablet [id number]  Yes Take 25 mg by mouth daily as needed. [provider]  Active   insulin  glargine (LANTUS  SOLOSTAR) 100 UNIT/ML Solostar Pen [id number] Yes Inject 40 Units into the skin daily. Shamleffer, Ibtehal Jaralla, MD  Active   insulin  lispro (HUMALOG ) 100 UNIT/ML KwikPen [id number] Yes Inject 14 Units into the skin 3 (three) times daily. Before meals daily Shamleffer, Ibtehal Jaralla, MD  Active   Insulin  Pen Needle 31G X 5 MM MISC [id number] Yes Use as directed in the morning, at noon, in the evening, and at bedtime. Shamleffer, Ibtehal Jaralla, MD  Active   isosorbide  dinitrate (ISORDIL ) 20 MG tablet [id number] Yes Take 1 tablet (20 mg total) by mouth 2 (two) times daily. Newlin, Enobong, MD  Active   Silver (TEGADERM AG MESH) 4X5 PADS [id number]  Apply 1 Device topically daily in the afternoon. Shamleffer, Ibtehal Jaralla, MD  Active   spironolactone  (ALDACTONE ) 25 MG tablet [id number] Yes Take 0.5 tablets (12.5 mg total) by mouth daily. Rolan Ezra RAMAN, MD  Active   valsartan  (DIOVAN ) 320 MG tablet [id number] Yes Take 1 tablet (320 mg total) by mouth daily. Brien Belvie BRAVO, MD  Active   varenicline  (CHANTIX  CONTINUING MONTH PAK) 1 MG tablet [id number] Yes Take 1 tablet (1 mg total) by mouth 2 (two) times daily. Rolan Ezra RAMAN, MD  Active             Recommendation:   DME requests:  other scale Continue Current Plan of Care Patient will call office to reschedule missed appointment for PFTs  Follow Up Plan:   Telephone follow up appointment date/time:  09/07/24 at 1:30 PM  [name], RN MSN [place]  [facility] Health RN Care Manager Direct Dial : [phone]  Fax: [phone]

## 2024-08-10 NOTE — Patient Instructions (Signed)
 Visit Information  Sarah Long was given information about Medicaid Managed Care team care coordination services as a part of their [employer] Medicaid benefit.   If you would like to schedule transportation through your [employer] [facility] plan, please call the following number at least 2 days in advance of your appointment: [phone]  If you are experiencing a behavioral health crisis, call the [employer] [place]  Behavioral Health Crisis Line at [phone]). The line is available 24 hours a day, seven days a week.   Sarah Long - following are the goals we discussed in your visit today:    Goals Addressed             This Visit's Progress    [facility] RN Care Plan   On track    Problems:  Chronic Disease Management support and education needs related to CHF and DMII  Goal: Over the next 30 days the Patient will demonstrate Ongoing adherence to prescribed treatment plan for DMII as evidenced by patient report of fasting blood sugar readings of 80-130 take all medications exactly as prescribed and will call provider for medication related questions as evidenced by patient report of medication adherence    Obtain scale and begin weighing daily Reschedule PFTs Schedule sleep study  Interventions:   Diabetes Interventions: Assessed patient's understanding of A1c goal: <7% Reviewed medications with patient and discussed importance of medication adherence Counseled on importance of regular laboratory monitoring as prescribed Discussed plans with patient for ongoing care management follow up and provided patient with direct contact information for care management team Reviewed scheduled/upcoming provider appointments including: PCP 08/23/24 Advised patient, providing education and rationale, to check cbg as advised by provider and record, calling endocrinology or PCP for findings outside established parameters Ensured patient has been  checking blood sugar via fingerstick as instructed at previous [facility] visit Lab Results  Component Value Date   HGBA1C 10.0 (A) 05/23/2024    Evaluation of current treatment plan related to chronic illness,  self-management and patient's adherence to plan as established by provider. Discussed plans with patient for ongoing care management follow up and provided patient with direct contact information for care management team Call placed to [facility] to confirm they have received referral. Per representative, they will contact patient to complete study within 4-6 weeks Patient reports she has missed call from office regarding PFT order. She was unable to attend appointment and has not yet rescheduled. Advised that she contact their office to reschedule missed appointment Message sent to PCP to request new order for scale due to CHF diagnosis  Patient Self-Care Activities:  Attend all scheduled provider appointments Call provider office for new concerns or questions  Take medications as prescribed   watch for swelling in feet, ankles and legs every day check blood sugar at prescribed times: as advised by provider check feet daily for cuts, sores or redness enter blood sugar readings and medication or insulin  into daily log take the blood sugar log to all doctor visits Call to schedule PFTs  Plan:  Telephone follow up appointment with care management team member scheduled for:  09/07/24 at 1:30 PM             Patient verbalizes understanding of instructions and care plan provided today and agrees to view in MyChart. Active MyChart status and patient understanding of how to access instructions and care plan via MyChart confirmed with patient.     The Patient  will call office* as advised to reschedule PFTs.  Telephone follow up appointment with Managed Medicaid care management team member scheduled for: 09/07/24 at 1:30 PM  I  have called [facility]. They confirmed that they have received the referral for a sleep study, and will be contacting you to complete within 4-6 weeks.  I am unable to tell what happened to the order for the scale that was previously sent. I have messaged your provider asking for a new order.  Have a great weekend!  Sarah Finlay, RN MSN [place]  [facility] Population Health RN Care Manager Direct Dial : [phone]  Fax: [phone]   Following is a copy of your plan of care:  There are no care plans that you recently modified to display for this patient.

## 2024-08-14 ENCOUNTER — Other Ambulatory Visit: Payer: Self-pay | Admitting: Family Medicine

## 2024-08-14 ENCOUNTER — Encounter: Payer: Self-pay | Admitting: Family Medicine

## 2024-08-14 MED ORDER — MISC. DEVICES MISC
0 refills | Status: DC
Start: 2024-08-14 — End: 2024-10-11

## 2024-08-15 ENCOUNTER — Other Ambulatory Visit: Payer: Self-pay | Admitting: Family Medicine

## 2024-08-16 ENCOUNTER — Telehealth: Payer: Self-pay

## 2024-08-16 NOTE — Telephone Encounter (Signed)
 Requested Prescriptions  Pending Prescriptions Disp Refills   VENTOLIN  HFA 108 (90 Base) MCG/ACT inhaler [Pharmacy Med Name: VENTOLIN  HFA 108 (90 BASE) MCG/ACT INHALATION AEROSOL SOLUTION] 18 g 2    Sig: INHALE 2 PUFFS INTO THE LUNGS EVERY 6 (SIX) HOURS AS NEEDED FOR WHEEZING OR SHORTNESS OF BREATH.     Pulmonology:  Beta Agonists 2 Failed - 08/16/2024  3:00 PM      Failed - Last BP in normal range    BP Readings from Last 1 Encounters:  08/07/24 (!) 136/98         Passed - Last Heart Rate in normal range    Pulse Readings from Last 1 Encounters:  08/07/24 84         Passed - Valid encounter within last 12 months    Recent Outpatient Visits           2 months ago DM type 2 with diabetic peripheral neuropathy (HCC)   [facility] [place] Delbert Clam, MD   5 months ago DM type 2 with diabetic peripheral neuropathy [facility])   [facility] [place] Delbert Clam, MD   7 months ago DM type 2 with diabetic peripheral neuropathy [facility])   [place] [facility] [place]. [facility] Brien Belvie BRAVO, MD   1 year ago HYPERTENSION, BENIGN SYSTEMIC   [place] [facility] [place]. [facility] Brien Belvie BRAVO, MD   1 year ago Type 2 diabetes mellitus with hyperglycemia, with long-term current use of insulin  [facility])   [place] [facility] Of . [facility] Brien Belvie BRAVO, MD

## 2024-08-20 ENCOUNTER — Other Ambulatory Visit: Payer: Self-pay

## 2024-08-20 NOTE — Patient Instructions (Signed)
 Marcus JONELLE Notice - I am sorry I was unable to reach you today for our scheduled appointment. I work with Newlin, Enobong, MD and am calling to support your healthcare needs. Please contact me at [phone] at your earliest convenience. I look forward to speaking with you soon.   Thank you,  Laymon Doll, BSW [place]/[facility] - [facility] Social Worker [phone]

## 2024-08-20 NOTE — Patient Outreach (Signed)
 BSW attempted f/u appt outreach during scheduled appt time without success. BSW left VM requesting call back to reschedule appt. BSW will continue outreach efforts.

## 2024-08-22 ENCOUNTER — Ambulatory Visit: Payer: Self-pay

## 2024-08-22 NOTE — Telephone Encounter (Signed)
  FYI Only or Action Required?: FYI only for provider.  Patient was last seen in primary care on 05/23/2024 by Newlin, Enobong, MD.  Called Nurse Triage reporting Vaginal Pain.  Symptoms began a week ago.  Interventions attempted: Nothing.  Symptoms are: gradually worsening.  Triage Disposition: See Physician Within 24 Hours  Patient/caregiver understands and will follow disposition?: Yes Copied from CRM [id number]. Topic: Clinical - Red Word Triage >> Aug 22, 2024  3:05 PM Fonda T wrote: Red Word that prompted transfer to Nurse Triage: Patient calling state she is having vaginal irritation, itching and intense pain when using restroom or bathing. Reason for Disposition  MODERATE-SEVERE itching (i.e., interferes with school, work, or sleep)  Answer Assessment - Initial Assessment Questions Recently started farxiga   1. SYMPTOM: What's the main symptom you're concerned about? (e.g., pain, itching, dryness)     Itching, burning, and pain with urination and bathing 2. LOCATION: Where is the  discomfort located? (e.g., inside/outside, left/right)     Inside vagina burning, labia itching 3. ONSET: When did the  discomfort  start?     One week ago 4. PAIN: Is there any pain? If Yes, ask: How bad is it? (Scale: 1-10; mild, moderate, severe)     Burning pain 5. ITCHING: Is there any itching? If Yes, ask: How bad is it? (Scale: 1-10; mild, moderate, severe)     Severe itching to labia 6. CAUSE: What do you think is causing the discharge? Have you had the same problem before? What happened then?     Has had BV in the past, recent treated for yeast infection two weeks ago 7. OTHER SYMPTOMS: Do you have any other symptoms? (e.g., fever, itching, vaginal bleeding, pain with urination, injury to genital area, vaginal foreign body)     No discharge, positive for pain with urination, positive for itching 8. PREGNANCY: Is there any chance you are pregnant? When was your last  menstrual period?     N/a  Protocols used: Vaginal Symptoms-A-AH

## 2024-08-22 NOTE — Telephone Encounter (Signed)
 Patient has been scheduled

## 2024-08-23 ENCOUNTER — Ambulatory Visit: Admitting: Family Medicine

## 2024-08-23 ENCOUNTER — Ambulatory Visit: Payer: Self-pay | Admitting: Family Medicine

## 2024-08-23 VITALS — BP 150/91 | HR 88 | Ht 64.0 in | Wt 225.8 lb

## 2024-08-23 DIAGNOSIS — N760 Acute vaginitis: Secondary | ICD-10-CM

## 2024-08-23 DIAGNOSIS — E1165 Type 2 diabetes mellitus with hyperglycemia: Secondary | ICD-10-CM | POA: Diagnosis not present

## 2024-08-23 DIAGNOSIS — B3731 Acute candidiasis of vulva and vagina: Secondary | ICD-10-CM | POA: Diagnosis not present

## 2024-08-23 DIAGNOSIS — Z794 Long term (current) use of insulin: Secondary | ICD-10-CM

## 2024-08-23 MED ORDER — METRONIDAZOLE 500 MG PO TABS: 500.0000 mg | ORAL_TABLET | Freq: Two times a day (BID) | ORAL | 0 refills | Status: AC

## 2024-08-23 MED ORDER — TERCONAZOLE 0.8 % VA CREA: 1.0000 | TOPICAL_CREAM | Freq: Every day | VAGINAL | 0 refills | Status: DC

## 2024-08-24 ENCOUNTER — Other Ambulatory Visit: Payer: Self-pay

## 2024-08-24 ENCOUNTER — Ambulatory Visit (HOSPITAL_COMMUNITY)
Admission: RE | Admit: 2024-08-24 | Discharge: 2024-08-24 | Disposition: A | Discharge status: Home / Self Care | Source: Ambulatory Visit | Attending: Internal Medicine | Admitting: Internal Medicine

## 2024-08-24 ENCOUNTER — Ambulatory Visit (HOSPITAL_COMMUNITY): Payer: Self-pay | Admitting: Family Medicine

## 2024-08-24 DIAGNOSIS — I5022 Chronic systolic (congestive) heart failure: Secondary | ICD-10-CM | POA: Diagnosis not present

## 2024-08-24 DIAGNOSIS — F1020 Alcohol dependence, uncomplicated: Secondary | ICD-10-CM | POA: Diagnosis not present

## 2024-08-24 LAB — BASIC METABOLIC PANEL WITH GFR
Anion gap: 12 (ref 5–15)
BUN: 23 mg/dL — ABNORMAL HIGH (ref 6–20)
CO2: 17 mmol/L — ABNORMAL LOW (ref 22–32)
Calcium: 8.7 mg/dL — ABNORMAL LOW (ref 8.9–10.3)
Chloride: 100 mmol/L (ref 98–111)
Creatinine, Ser: 2.11 mg/dL — ABNORMAL HIGH (ref 0.44–1.00)
GFR, Estimated: 28 mL/min — ABNORMAL LOW (ref >60)
Glucose, Bld: 585 mg/dL (ref 70–99)
Potassium: 4.7 mmol/L (ref 3.5–5.1)
Sodium: 129 mmol/L — ABNORMAL LOW (ref 135–145)

## 2024-08-24 NOTE — Telephone Encounter (Addendum)
 Pt aware, agreeable, and verbalized understanding   She stated she has been off her insulin  for a week. Advised her to go to ED to get evaluated and follow up with her PCP ASAP to get insulin  issue fixed   ----- Message from Harlene CHRISTELLA Gainer sent at 08/24/2024  1:21 PM EDT ----- Glucose dangerously high, Na low (likely due to elevated glucose). Needs ED eval asap ----- Message ----- From: Interface, Lab In [place] Sent: 08/24/2024  11:27 AM EDT To: Harlene CHRISTELLA Gainer, FNP

## 2024-08-24 NOTE — Patient Outreach (Signed)
 BSW attempted 3rd outreach attempt with pt without success. BSW will not continue outreach efforts at this time and notify responsible staff member RNCM Rosaline that BSW will be dropping from care team at this time.

## 2024-08-24 NOTE — Patient Instructions (Signed)
 Marcus JONELLE Notice - I have attempted to call you three times but have been unsuccessful in reaching you. I work with Newlin, Enobong, MD and am calling to support your healthcare needs. If I can be of assistance to you, please contact me at [phone].     Thank you,   Laymon Doll, BSW [place]/[facility] - [facility] Social Worker [phone]

## 2024-08-27 ENCOUNTER — Encounter: Payer: Self-pay | Admitting: Family Medicine

## 2024-08-27 NOTE — Progress Notes (Signed)
 Established Patient Office Visit  Subjective    Patient ID: Sarah Long, female    DOB: [birth year]  Age: 53 y.o. MRN: [id number]  CC:  Chief Complaint  Patient presents with   Vaginal Itching    Pt reports vaginal itching and burning    HPI Sarah Long presents with complaint of vaginitis. She reports that her blood sugars are not yet controlled and that she has had recurrent symptoms. Her symptoms improved with recent tx but then recurred.   Outpatient Encounter Medications as of 08/23/2024  Medication Sig   ARIPiprazole  (ABILIFY ) 15 MG tablet Take 1 tablet (15 mg total) by mouth daily.   atorvastatin  (LIPITOR ) 40 MG tablet Take 1 tablet (40 mg total) by mouth daily.   carvedilol  (COREG ) 12.5 MG tablet Take 1 tablet (12.5 mg total) by mouth 2 (two) times daily with a meal.   Continuous Glucose Sensor (DEXCOM G7 SENSOR) MISC 1 Device by Does not apply route as directed. Every 10 days   dapagliflozin  propanediol (FARXIGA ) 10 MG TABS tablet Take 1 tablet (10 mg total) by mouth daily before breakfast.   fluconazole  (DIFLUCAN ) 150 MG tablet Take 1 tablet (150 mg total) by mouth daily.   furosemide  (LASIX ) 20 MG tablet Take 80 mg by mouth daily.   GALAFOLD 123 MG CAPS Take by mouth.   hydrALAZINE  (APRESOLINE ) 50 MG tablet Take one three times daily   hydrOXYzine  (ATARAX ) 25 MG tablet Take 25 mg by mouth daily as needed.   insulin  glargine (LANTUS  SOLOSTAR) 100 UNIT/ML Solostar Pen Inject 40 Units into the skin daily.   insulin  lispro (HUMALOG ) 100 UNIT/ML KwikPen Inject 14 Units into the skin 3 (three) times daily. Before meals daily   Insulin  Pen Needle 31G X 5 MM MISC Use as directed in the morning, at noon, in the evening, and at bedtime.   isosorbide  dinitrate (ISORDIL ) 20 MG tablet Take 1 tablet (20 mg total) by mouth 2 (two) times daily.   metroNIDAZOLE  (FLAGYL ) 500 MG tablet Take 1 tablet (500 mg total) by mouth 2 (two) times daily for 7 days.   Misc. Devices MISC Scale.   Diagnosis-CHF   Silver (TEGADERM AG MESH) 4X5 PADS Apply 1 Device topically daily in the afternoon.   spironolactone  (ALDACTONE ) 25 MG tablet Take 0.5 tablets (12.5 mg total) by mouth daily.   terconazole  (TERAZOL 3 ) 0.8 % vaginal cream Place 1 applicator vaginally at bedtime.   valsartan  (DIOVAN ) 320 MG tablet Take 1 tablet (320 mg total) by mouth daily.   varenicline  (CHANTIX  CONTINUING MONTH PAK) 1 MG tablet Take 1 tablet (1 mg total) by mouth 2 (two) times daily.   VENTOLIN  HFA 108 (90 Base) MCG/ACT inhaler INHALE 2 PUFFS INTO THE LUNGS EVERY 6 (SIX) HOURS AS NEEDED FOR WHEEZING OR SHORTNESS OF BREATH.   No facility-administered encounter medications on file as of 08/23/2024.    Past Medical History:  Diagnosis Date   Abnormal Pap smear 1998   Abnormal vaginal bleeding 12/19/2011   Acute urinary retention 06/26/2021   Arthritis    Bacterial infection    Bipolar 1 disorder (HCC)    Blister of second toe of left foot 11/21/2016   Candida vaginitis 07/2007   Depression    recently added wellbutrin-has not taken yet for bipolar   Diabetes in pregnancy    Diabetes mellitus    nph 20U qam and qpm, regular with meals   Diabetic ketoacidosis without coma associated with type 2 diabetes mellitus (HCC)  Fibroid    Galactorrhea of right breast 2008   GERD (gastroesophageal reflux disease)    H/O amenorrhea 07/2007   H/O dizziness 10/14/2011   H/O dysmenorrhea 2010   H/O menorrhagia 10/14/2011   H/O varicella    Headache(784.0)    Heavy vaginal bleeding due to contraceptive injection use 10/12/2011   Depo Provera    Herpes    Homelessness 11/19/2021   HSV-2 infection 01/03/2009   Hx: UTI (urinary tract infection) 2009   Hypertension    on aldomet   Hypoalbuminemia due to protein-calorie malnutrition (HCC) 06/26/2021   Increased BMI 2010   Irregular uterine bleeding 04/04/2012   Pt has mirena    Obesity 10/14/2011   Oligomenorrhea 07/2007   Osteomyelitis of great toe of  right foot (HCC) 03/22/2022   Pelvic pain in female    Presence of permanent cardiac pacemaker    Preterm labor    PTSD (post-traumatic stress disorder)    Trichomonas    Yeast infection     Past Surgical History:  Procedure Laterality Date   ABDOMINAL AORTOGRAM W/LOWER EXTREMITY N/A 06/17/2021   Procedure: ABDOMINAL AORTOGRAM W/LOWER EXTREMITY;  Surgeon: Darron Deatrice LABOR, MD;  Location: [facility];  Service: Cardiovascular;  Laterality: N/A;   AMPUTATION Left 06/27/2021   Procedure: AMPUTATION BELOW KNEE;  Surgeon: Harden Jerona GAILS, MD;  Location: [facility] OR;  Service: Orthopedics;  Laterality: Left;   AMPUTATION Right 09/04/2021   Procedure: RIGHT GREAT TOE AMPUTATION;  Surgeon: Harden Jerona GAILS, MD;  Location: [facility] OR;  Service: Orthopedics;  Laterality: Right;   CESAREAN SECTION  1991   LEFT HEART CATH AND CORONARY ANGIOGRAPHY N/A 02/10/2020   Procedure: LEFT HEART CATH AND CORONARY ANGIOGRAPHY;  Surgeon: Burnard Debby LABOR, MD;  Location: [facility];  Service: Cardiovascular;  Laterality: N/A;   PACEMAKER IMPLANT N/A 02/13/2020   Procedure: PACEMAKER IMPLANT;  Surgeon: Inocencio Soyla Lunger, MD;  Location: [facility];  Service: Cardiovascular;  Laterality: N/A;   TEMPORARY PACEMAKER N/A 02/10/2020   Procedure: TEMPORARY PACEMAKER;  Surgeon: Burnard Debby LABOR, MD;  Location[facility];  Service: Cardiovascular;  Laterality: N/A;    Family History  Problem Relation Age of Onset   Hypertension Mother    Diabetes Mother    Heart disease Mother    Hypertension Father    Diabetes Father    Heart disease Father    Depression Maternal Grandmother    Suicidality Maternal Grandmother    Stroke Maternal Grandfather    Schizophrenia Paternal Grandmother    Other Neg Hx    Breast cancer Neg Hx     Social History   Socioeconomic History   Marital status: Significant Other    Spouse name: Not on file   Number of children: Not on file   Years of education: Not on file    Highest education level: Some college, no degree  Occupational History   Not on file  Tobacco Use   Smoking status: Former    Current packs/day: 0.00    Average packs/day: 0.5 packs/day for 20.0 years (10.0 ttl pk-yrs)    Types: Cigarettes    Start date: 07/01/2001    Quit date: 07/01/2021    Years since quitting: 3.1   Smokeless tobacco: Never   Tobacco comments:    1. [employer]rix to stop smoking   Vaping Use   Vaping status: Never Used  Substance and Sexual Activity   Alcohol use: Not Currently    Comment:  denies recently; history of excessive use   Drug use: No   Sexual activity: Yes    Partners: Female, Female    Birth control/protection: None  Other Topics Concern   Not on file  Social History Narrative   Not on file   Social Drivers of Health   Financial Resource Strain: High Risk (12/22/2023)   Overall Financial Resource Strain (CARDIA)    Difficulty of Paying Living Expenses: Hard  Food Insecurity: Food Insecurity Present (07/24/2024)   Hunger Vital Sign    Worried About Running Out of Food in the Last Year: Not on file    Ran Out of Food in the Last Year: Sometimes true  Transportation Needs: Unmet Transportation Needs (07/24/2024)   PRAPARE - Administrator, Civil Service (Medical): Yes    Lack of Transportation (Non-Medical): Not on file  Physical Activity: Insufficiently Active (12/22/2023)   Exercise Vital Sign    Days of Exercise per Week: 3 days    Minutes of Exercise per Session: 30 min  Stress: Stress Concern Present (12/29/2023)   [employer] of Occupational Health - Occupational Stress Questionnaire    Feeling of Stress : To some extent  Social Connections: Moderately Integrated (12/22/2023)   Social Connection and Isolation Panel    Frequency of Communication with Friends and Family: More than three times a week    Frequency of Social Gatherings with Friends and Family: More than three times a week    Attends Religious Services: More than 4  times per year    Active Member of [employer] or Organizations: Yes    Attends Banker Meetings: More than 4 times per year    Marital Status: Divorced  Intimate Partner Violence: Not At Risk (07/24/2024)   Humiliation, Afraid, Rape, and Kick questionnaire    Fear of Current or Ex-Partner: No    Emotionally Abused: No    Physically Abused: No    Sexually Abused: No    Review of Systems  All other systems reviewed and are negative.       Objective    BP (!) 150/91   Pulse 88   Ht 5' 4 (1.626 m)   Wt 225 lb 12.8 oz (102.4 kg)   LMP 08/23/2024 (Exact Date)   SpO2 97%   BMI 38.76 kg/m   Physical Exam Vitals and nursing note reviewed.  Constitutional:      General: She is not in acute distress.    Appearance: She is obese.  Cardiovascular:     Rate and Rhythm: Normal rate and regular rhythm.  Pulmonary:     Effort: Pulmonary effort is normal.     Breath sounds: Normal breath sounds.  Abdominal:     Palpations: Abdomen is soft.     Tenderness: There is no abdominal tenderness.  Neurological:     General: No focal deficit present.     Mental Status: She is alert and oriented to person, place, and time.         Assessment & Plan:   Yeast vaginitis  Vaginitis and vulvovaginitis  Type 2 diabetes mellitus with hyperglycemia, with long-term current use of insulin  (HCC)  Other orders -     Terconazole ; Place 1 applicator vaginally at bedtime.  Dispense: 20 g; Refill: 0 -     metroNIDAZOLE ; Take 1 tablet (500 mg total) by mouth 2 (two) times daily for 7 days.  Dispense: 14 tablet; Refill: 0     Return if symptoms worsen or  fail to improve.   Tanda Raguel SQUIBB, MD

## 2024-08-28 ENCOUNTER — Encounter: Payer: Self-pay | Admitting: Internal Medicine

## 2024-08-28 ENCOUNTER — Ambulatory Visit: Attending: Internal Medicine | Admitting: Internal Medicine

## 2024-08-28 VITALS — BP 124/74 | HR 79 | Temp 98.2°F | Ht 64.0 in | Wt 226.0 lb

## 2024-08-28 DIAGNOSIS — H10023 Other mucopurulent conjunctivitis, bilateral: Secondary | ICD-10-CM

## 2024-08-28 MED ORDER — OLOPATADINE HCL 0.1 % OP SOLN
1.0000 [drp] | Freq: Two times a day (BID) | OPHTHALMIC | 0 refills | Status: AC
Start: 2024-08-28 — End: ?

## 2024-08-28 MED ORDER — POLYMYXIN B-TRIMETHOPRIM 10000-0.1 UNIT/ML-% OP SOLN
1.0000 [drp] | Freq: Four times a day (QID) | OPHTHALMIC | 0 refills | Status: AC
Start: 2024-08-28 — End: ?

## 2024-08-28 NOTE — Patient Instructions (Signed)
  VISIT SUMMARY: You came in today because of discharge and irritation in both of your eyes. You have been experiencing crusty eyes in the morning, green discharge, itching, tearing, redness, blurred vision, and sensitivity to light. You have tried over-the-counter eye drops and compresses, but they only provided temporary relief.  YOUR PLAN: -BILATERAL CONJUNCTIVITIS WITH ALLERGIC AND BACTERIAL FEATURES: You have conjunctivitis, which is an inflammation of the outermost layer of the white part of your eye and the inner surface of your eyelid. This condition can be caused by allergies or bacteria. You have been prescribed Patanol eye drops to help with the allergic component, to be used in both eyes twice daily for 5-7 days. Additionally, you have been prescribed Polytrim  eye drops to address the bacterial component, to be used four times daily while awake. Please continue using warm compresses on your eyes. Do not use both eye drops at the same time; start with Patanol and then use Polytrim  an hour or two later. If your symptoms worsen, please return or call us . We have confirmed with the pharmacist that the prescribed eye drops do not contain sulfa, which you are allergic to.  INSTRUCTIONS: Please follow up if your symptoms worsen or do not improve with the prescribed treatment.                      Contains text generated by [employer].                                 Contains text generated by Abridge.

## 2024-08-28 NOTE — Progress Notes (Signed)
 Patient ID: Sarah Long, female    DOB: 05-24-1971  MRN: [id number]  CC: Conjunctivitis (Conjunctivitis X1 week, tearing, redness  )   Subjective: Sarah Long is a 53 y.o. female who presents for UC visit.  PCP is Dr. Newlin whom she last saw 05/2024. Her concerns today include:  Patient with history of DM, HTN, left BKA, CKD 3.  Discussed the use of AI scribe software for clinical note transcription with the patient, who gave verbal consent to proceed.  History of Present Illness   Sarah Long is a 53 year old female who presents with bilateral eye discharge and irritation.  For the past week, she has experienced crusty eyes upon waking, with green discharge initially from the right eye, now affecting both eyes. Significant itching, tearing, and redness are present in both eyes.  She endorses blurred vision, specifically noting diplopia when focusing on tasks such as filling out paperwork and photophobia, particularly with sunlight, exacerbates her symptoms. No pain in the eyes.  Does not wear contact lenses or eye makeup.  She usually goes to the nail salon to have manicure that is done with some type of acrylic gel.  Last done about a week ago.  She has attempted to alleviate the itching with over-the-counter eye drops, which provided only temporary relief. She has also been using warm and cold compresses.  No itchy throat, sore throat, nasal congestion, or sneezing. She has a known allergy to sulfa antibiotics, which cause hives.       Patient Active Problem List   Diagnosis Date Noted   Type 2 diabetes mellitus with stage 3a chronic kidney disease, with long-term current use of insulin  (HCC) 05/29/2024   Menorrhagia with regular cycle 02/21/2024   Presence of heart assist device (HCC) 12/22/2023   Skin rash 06/28/2023   PTSD (post-traumatic stress disorder) 05/13/2023   Cardiac amyloidosis (HCC) 04/27/2023   Type 2 diabetes mellitus with microalbuminuria, with long-term  current use of insulin  (HCC) 12/15/2022   Peripheral vascular disease, unspecified 03/22/2022   Type 2 diabetes mellitus with diabetic polyneuropathy, with long-term current use of insulin  (HCC) 02/03/2022   Type 2 diabetes mellitus with hyperglycemia, with long-term current use of insulin  (HCC) 02/03/2022   Microalbuminuria 02/03/2022   Right great toe amputee 11/03/2021   Class 2 severe obesity due to excess calories with serious comorbidity and body mass index (BMI) of 36.0 to 36.9 in adult 10/15/2021   Insomnia 10/07/2021   Left below-knee amputee (HCC) 07/01/2021   Normocytic anemia 06/26/2021   Heart failure with mid-range ejection fraction (HCC) 06/26/2021   Diabetic nephropathy with proteinuria (HCC) 06/26/2021   Vaginal discharge 07/16/2020   Dental caries 04/01/2020   Cracked tooth 04/01/2020   Vitamin D  deficiency 03/11/2020   Prolonged QT interval 02/26/2020   Chronic combined systolic and diastolic CHF (congestive heart failure) (HCC) 02/26/2020   Heart block AV complete (HCC) 02/11/2020   Exocrine pancreatic insufficiency    Dysmenorrhea 10/02/2019   DM type 2 with diabetic peripheral neuropathy (HCC) 05/30/2019   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 block) 05/29/2019   Former smoker 11/26/2013   Hypercholesteremia 02/02/2007   Bipolar 1 disorder, depressed (HCC) 02/02/2007   HYPERTENSION, BENIGN SYSTEMIC 02/02/2007     Current Outpatient Medications on File Prior to Visit  Medication Sig Dispense Refill   ARIPiprazole  (ABILIFY ) 15 MG tablet Take 1 tablet (15 mg total) by mouth daily. 30 tablet 2   atorvastatin  (LIPITOR ) 40  MG tablet Take 1 tablet (40 mg total) by mouth daily. 90 tablet 3   carvedilol  (COREG ) 12.5 MG tablet Take 1 tablet (12.5 mg total) by mouth 2 (two) times daily with a meal. 120 tablet 1   Continuous Glucose Sensor (DEXCOM G7 SENSOR) MISC 1 Device by Does not apply route as directed. Every 10 days 9 each 3   dapagliflozin   propanediol (FARXIGA ) 10 MG TABS tablet Take 1 tablet (10 mg total) by mouth daily before breakfast. 30 tablet 11   fluconazole  (DIFLUCAN ) 150 MG tablet Take 1 tablet (150 mg total) by mouth daily. 1 tablet 0   furosemide  (LASIX ) 20 MG tablet Take 80 mg by mouth daily.     GALAFOLD 123 MG CAPS Take by mouth.     hydrALAZINE  (APRESOLINE ) 50 MG tablet Take one three times daily 180 tablet 1   hydrOXYzine  (ATARAX ) 25 MG tablet Take 25 mg by mouth daily as needed.     insulin  glargine (LANTUS  SOLOSTAR) 100 UNIT/ML Solostar Pen Inject 40 Units into the skin daily. 45 mL 3   insulin  lispro (HUMALOG ) 100 UNIT/ML KwikPen Inject 14 Units into the skin 3 (three) times daily. Before meals daily 45 mL 4   Insulin  Pen Needle 31G X 5 MM MISC Use as directed in the morning, at noon, in the evening, and at bedtime. 100 each 3   isosorbide  dinitrate (ISORDIL ) 20 MG tablet Take 1 tablet (20 mg total) by mouth 2 (two) times daily. 60 tablet 3   metroNIDAZOLE  (FLAGYL ) 500 MG tablet Take 1 tablet (500 mg total) by mouth 2 (two) times daily for 7 days. 14 tablet 0   Misc. Devices MISC Scale.  Diagnosis-CHF 1 each 0   Silver (TEGADERM AG MESH) 4X5 PADS Apply 1 Device topically daily in the afternoon. 50 each 3   spironolactone  (ALDACTONE ) 25 MG tablet Take 0.5 tablets (12.5 mg total) by mouth daily. 45 tablet 3   terconazole  (TERAZOL 3 ) 0.8 % vaginal cream Place 1 applicator vaginally at bedtime. 20 g 0   valsartan  (DIOVAN ) 320 MG tablet Take 1 tablet (320 mg total) by mouth daily. 90 tablet 3   varenicline  (CHANTIX  CONTINUING MONTH PAK) 1 MG tablet Take 1 tablet (1 mg total) by mouth 2 (two) times daily. 60 tablet 2   VENTOLIN  HFA 108 (90 Base) MCG/ACT inhaler INHALE 2 PUFFS INTO THE LUNGS EVERY 6 (SIX) HOURS AS NEEDED FOR WHEEZING OR SHORTNESS OF BREATH. 18 g 2   No current facility-administered medications on file prior to visit.    Allergies  Allergen Reactions   Lamictal  [Lamotrigine ] Itching   Strawberry  Extract Hives   Sulfa Antibiotics Hives and Itching    Social History   Socioeconomic History   Marital status: Significant Other    Spouse name: Not on file   Number of children: Not on file   Years of education: Not on file   Highest education level: Some college, no degree  Occupational History   Not on file  Tobacco Use   Smoking status: Former    Current packs/day: 0.00    Average packs/day: 0.5 packs/day for 20.0 years (10.0 ttl pk-yrs)    Types: Cigarettes    Start date: 07/01/2001    Quit date: 07/01/2021    Years since quitting: 3.1   Smokeless tobacco: Never   Tobacco comments:    1. Centrix to stop smoking   Vaping Use   Vaping status: Never Used  Substance and Sexual Activity  Alcohol use: Not Currently    Comment: denies recently; history of excessive use   Drug use: No   Sexual activity: Yes    Partners: Female, Female    Birth control/protection: None  Other Topics Concern   Not on file  Social History Narrative   Not on file   Social Drivers of Health   Financial Resource Strain: High Risk (12/22/2023)   Overall Financial Resource Strain (CARDIA)    Difficulty of Paying Living Expenses: Hard  Food Insecurity: Food Insecurity Present (07/24/2024)   Hunger Vital Sign    Worried About Running Out of Food in the Last Year: Not on file    Ran Out of Food in the Last Year: Sometimes true  Transportation Needs: Unmet Transportation Needs (07/24/2024)   PRAPARE - Administrator, Civil Service (Medical): Yes    Lack of Transportation (Non-Medical): Not on file  Physical Activity: Insufficiently Active (12/22/2023)   Exercise Vital Sign    Days of Exercise per Week: 3 days    Minutes of Exercise per Session: 30 min  Stress: Stress Concern Present (12/29/2023)   [employer] of Occupational Health - Occupational Stress Questionnaire    Feeling of Stress : To some extent  Social Connections: Moderately Integrated (12/22/2023)   Social Connection  and Isolation Panel    Frequency of Communication with Friends and Family: More than three times a week    Frequency of Social Gatherings with Friends and Family: More than three times a week    Attends Religious Services: More than 4 times per year    Active Member of Clubs or Organizations: Yes    Attends Banker Meetings: More than 4 times per year    Marital Status: Divorced  Intimate Partner Violence: Not At Risk (07/24/2024)   Humiliation, Afraid, Rape, and Kick questionnaire    Fear of Current or Ex-Partner: No    Emotionally Abused: No    Physically Abused: No    Sexually Abused: No    Family History  Problem Relation Age of Onset   Hypertension Mother    Diabetes Mother    Heart disease Mother    Hypertension Father    Diabetes Father    Heart disease Father    Depression Maternal Grandmother    Suicidality Maternal Grandmother    Stroke Maternal Grandfather    Schizophrenia Paternal Grandmother    Other Neg Hx    Breast cancer Neg Hx     Past Surgical History:  Procedure Laterality Date   ABDOMINAL AORTOGRAM W/LOWER EXTREMITY N/A 06/17/2021   Procedure: ABDOMINAL AORTOGRAM W/LOWER EXTREMITY;  Surgeon: Darron Deatrice LABOR, MD;  Location: [facility];  Service: Cardiovascular;  Laterality: N/A;   AMPUTATION Left 06/27/2021   Procedure: AMPUTATION BELOW KNEE;  Surgeon: Harden Jerona GAILS, MD;  Location: [facility] OR;  Service: Orthopedics;  Laterality: Left;   AMPUTATION Right 09/04/2021   Procedure: RIGHT GREAT TOE AMPUTATION;  Surgeon: Harden Jerona GAILS, MD;  Location: [facility] OR;  Service: Orthopedics;  Laterality: Right;   CESAREAN SECTION  1991   LEFT HEART CATH AND CORONARY ANGIOGRAPHY N/A 02/10/2020   Procedure: LEFT HEART CATH AND CORONARY ANGIOGRAPHY;  Surgeon: Burnard Debby LABOR, MD;  Location: [facility];  Service: Cardiovascular;  Laterality: N/A;   PACEMAKER IMPLANT N/A 02/13/2020   Procedure: PACEMAKER IMPLANT;  Surgeon: Inocencio Soyla Lunger, MD;   Location: [facility];  Service: Cardiovascular;  Laterality: N/A;   TEMPORARY PACEMAKER  N/A 02/10/2020   Procedure: TEMPORARY PACEMAKER;  Surgeon: Burnard Debby LABOR, MD;  Location: [facility] IN[facility] LAB;  Service: Cardiovascular;  Laterality: N/A;    ROS: Review of Systems Negative except as stated above  PHYSICAL EXAM: BP 124/74 (BP Location: Left Arm, Patient Position: Sitting, Cuff Size: Normal)   Pulse 79   Temp 98.2 F (36.8 C) (Oral)   Ht 5' 4 (1.626 m)   Wt 226 lb (102.5 kg)   LMP 08/23/2024 (Exact Date)   SpO2 98%   BMI 38.79 kg/m   Physical Exam  General appearance - alert, well appearing, middle age obese AAF and in no distress Mental status - normal mood, behavior, speech, dress, motor activity, and thought processes Eyes - periorbital soft tissue slightly puffy. Noted tearing and white matter in the medial canthus BL. Mild to moderate conjunctival injection. EOMI without any discomfort expressed with movement. PERRLA without any discomfort express with shinning light in eye. Nose: no enlargement of nasal turbinates.      Latest Ref Rng & Units 08/24/2024   10:10 AM 08/07/2024   10:50 AM 06/26/2024    4:48 PM  CMP  Glucose 70 - 99 mg/dL 414  589  696   BUN 6 - 20 mg/dL 23  18  21    Creatinine 0.44 - 1.00 mg/dL 7.88  8.57  8.43   Sodium 135 - 145 mmol/L 129  134  129   Potassium 3.5 - 5.1 mmol/L 4.7  4.4  4.2   Chloride 98 - 111 mmol/L 100  103  98   CO2 22 - 32 mmol/L 17  23  18    Calcium  8.9 - 10.3 mg/dL 8.7  9.0  8.8    Lipid Panel     Component Value Date/Time   CHOL 180 06/26/2024 1648   CHOL 154 12/22/2023 1129   TRIG 207 (H) 06/26/2024 1648   HDL 45 06/26/2024 1648   HDL 45 12/22/2023 1129   CHOLHDL 4.0 06/26/2024 1648   VLDL 41 (H) 06/26/2024 1648   LDLCALC 94 06/26/2024 1648   LDLCALC 82 12/22/2023 1129    CBC    Component Value Date/Time   WBC 12.1 (H) 02/21/2024 1433   WBC 7.6 01/06/2024 2137   RBC 5.41 (H) 02/21/2024 1433   RBC 2.94  (L) 01/06/2024 2137   HGB 14.0 02/21/2024 1433   HCT 45.4 02/21/2024 1433   PLT 242 02/21/2024 1433   MCV 84 02/21/2024 1433   MCH 25.9 (L) 02/21/2024 1433   MCH 26.9 01/06/2024 2137   MCHC 30.8 (L) 02/21/2024 1433   MCHC 30.3 01/06/2024 2137   RDW 17.0 (H) 02/21/2024 1433   LYMPHSABS 2.2 02/21/2024 1433   MONOABS 0.4 07/21/2022 0333   EOSABS 0.3 02/21/2024 1433   BASOSABS 0.0 02/21/2024 1433    ASSESSMENT AND PLAN: 1. Pink eye disease of both eyes (Primary) Definitely an allergic component with viral vs bacterial component.  Rxn sent for Patanol and Polytrim  eye drops -Use warm compresses 2 x a day for 10 mins F/u if no improvement - olopatadine  (PATANOL) 0.1 % ophthalmic solution; Place 1 drop into both eyes 2 (two) times daily.  Dispense: 5 mL; Refill: 0 - trimethoprim -polymyxin b  (POLYTRIM ) ophthalmic solution; Place 1 drop into both eyes every 6 (six) hours. While awake.  Dispense: 10 mL; Refill: 0   Patient was given the opportunity to ask questions.  Patient verbalized understanding of the plan and was able to repeat key elements of the plan.  This documentation was completed using Paediatric nurse.  Any transcriptional errors are unintentional.  No orders of the defined types were placed in this encounter.    Requested Prescriptions   Signed Prescriptions Disp Refills   olopatadine  (PATANOL) 0.1 % ophthalmic solution 5 mL 0    Sig: Place 1 drop into both eyes 2 (two) times daily.   trimethoprim -polymyxin b  (POLYTRIM ) ophthalmic solution 10 mL 0    Sig: Place 1 drop into both eyes every 6 (six) hours. While awake.    No follow-ups on file.  Barnie Louder, MD, FACP

## 2024-08-29 ENCOUNTER — Ambulatory Visit (HOSPITAL_COMMUNITY)
Admission: RE | Admit: 2024-08-29 | Discharge: 2024-08-29 | Disposition: A | Discharge status: Home / Self Care | Source: Ambulatory Visit | Attending: Cardiology | Admitting: Cardiology

## 2024-08-29 ENCOUNTER — Ambulatory Visit (HOSPITAL_COMMUNITY): Payer: Self-pay | Admitting: Cardiology

## 2024-08-29 DIAGNOSIS — E785 Hyperlipidemia, unspecified: Secondary | ICD-10-CM | POA: Insufficient documentation

## 2024-08-29 LAB — HEPATIC FUNCTION PANEL
ALT: 17 U/L (ref 0–44)
AST: 23 U/L (ref 15–41)
Albumin: 3.1 g/dL — ABNORMAL LOW (ref 3.5–5.0)
Alkaline Phosphatase: 80 U/L (ref 38–126)
Bilirubin, Direct: <0.1 mg/dL (ref 0.0–0.2)
Total Bilirubin: 0.5 mg/dL (ref 0.0–1.2)
Total Protein: 6.3 g/dL — ABNORMAL LOW (ref 6.5–8.1)

## 2024-08-29 LAB — LIPID PANEL
Cholesterol: 188 mg/dL (ref 0–200)
HDL: 49 mg/dL (ref >40)
LDL Cholesterol: 91 mg/dL (ref 0–99)
Total CHOL/HDL Ratio: 3.8 ratio
Triglycerides: 240 mg/dL — ABNORMAL HIGH (ref <150)
VLDL: 48 mg/dL — ABNORMAL HIGH (ref 0–40)

## 2024-09-03 ENCOUNTER — Ambulatory Visit: Admitting: Family Medicine

## 2024-09-03 ENCOUNTER — Other Ambulatory Visit: Payer: Self-pay

## 2024-09-03 ENCOUNTER — Ambulatory Visit: Payer: Self-pay | Admitting: Family Medicine

## 2024-09-03 MED ORDER — TERCONAZOLE 0.8 % VA CREA: 1.0000 | TOPICAL_CREAM | Freq: Every day | VAGINAL | 0 refills | Status: AC

## 2024-09-03 MED ORDER — OMNIPOD 5 DEXG7G6 PODS GEN 5 MISC: 2 refills | Status: DC

## 2024-09-03 NOTE — Telephone Encounter (Signed)
 Patient was seen on 08/23/2024 by Dr. Tanda and states symptoms vaginal itching has not improved. Patient requesting a refill of  terconazole  (TERAZOL 3 ) 0.8 %, please send to    [facility] - [place], [place] - [address]

## 2024-09-03 NOTE — Telephone Encounter (Signed)
 FYI Only or Action Required?: Action required by provider: medication refill request and update on patient condition.  Patient was last seen in primary care on 08/28/2024 by Vicci Barnie NOVAK, MD.  Called Nurse Triage reporting Vaginal Itching.  Symptoms began 1-2 days.  Interventions attempted: Nothing.  Symptoms are: vaginal itching (severe); RLQ abdominal severe intermittent pain, blood glucose 292 after eating at this time (states it has been under 300s, low 200s-100s) gradually worsening.  Triage Disposition: See Physician Within 24 Hours  Patient/caregiver understands and will follow disposition?: Yes           Copied from CRM #[id number]. Topic: Clinical - Medical Advice >> Sep 03, 2024  9:57 AM Tiffany B wrote: Reason for CRM: Patient was seen on 08/23/2024 by Dr. Tanda and states symptoms vaginal itching has not improved. Patient requesting a refill of  terconazole  (TERAZOL 3 ) 0.8 %, please send to   [facility] - [place], [place] - [address]  Phone: [phone] Fax: [phone] Reason for Disposition  MODERATE-SEVERE itching (i.e., interferes with school, work, or sleep)  Answer Assessment - Initial Assessment Questions Informed patient Dr Tanda wrote: Return if symptoms worsen or fail to improve. Patient states she was told to call back and she could get a refill. Advised due to no availability at PCP or regional offices: go to ED/[facility].   1. SYMPTOM: What's the main symptom you're concerned about? (e.g., pain, itching, dryness)     Itching.  2. LOCATION: Where is the  itching located? (e.g., inside/outside, left/right)     The whole thing  3. ONSET: When did the  itching  start?     Improved a few days after treatment, returned x 1-2 days.  4. PAIN: Is there any pain? If Yes, ask: How bad is it? (Scale: 1-10; mild, moderate, severe)     Yes. Cramping, sharp pain. RLQ. Comes and goes; 10/10.  5.  ITCHING: Is there any itching? If Yes, ask: How bad is it? (Scale: 1-10; mild, moderate, severe)     Yes; severe.  6. CAUSE: What do you think is causing the discharge? Have you had the same problem before? What happened then?     Yeast infection.  7. OTHER SYMPTOMS: Do you have any other symptoms? (e.g., fever, itching, vaginal bleeding, pain with urination, injury to genital area, vaginal foreign body)     Denies fever, vaginal discharge, elevated blood sugars (she states they have been under 300s).  8. PREGNANCY: Is there any chance you are pregnant? When was your last menstrual period?     N/A.  Protocols used: Vaginal Symptoms-A-AH

## 2024-09-03 NOTE — Telephone Encounter (Signed)
Prescription has been refilled.

## 2024-09-04 NOTE — Telephone Encounter (Signed)
 Pt has been informed.

## 2024-09-07 ENCOUNTER — Other Ambulatory Visit: Payer: Self-pay | Admitting: Internal Medicine

## 2024-09-07 ENCOUNTER — Other Ambulatory Visit: Payer: Self-pay

## 2024-09-07 NOTE — Patient Outreach (Signed)
 Complex Care Management   Visit Note  09/07/2024  Name:  Sarah Long MRN: [id number] DOB: 04-09-1971  Situation: Referral received for Complex Care Management related to Heart Failure and Diabetes with Complications I obtained verbal consent from Patient.  Visit completed with Patient  on the phone  Background:   Past Medical History:  Diagnosis Date   Abnormal Pap smear 1998   Abnormal vaginal bleeding 12/19/2011   Acute urinary retention 06/26/2021   Arthritis    Bacterial infection    Bipolar 1 disorder (HCC)    Blister of second toe of left foot 11/21/2016   Candida vaginitis 07/2007   Depression    recently added wellbutrin-has not taken yet for bipolar   Diabetes in pregnancy    Diabetes mellitus    nph 20U qam and qpm, regular with meals   Diabetic ketoacidosis without coma associated with type 2 diabetes mellitus (HCC)    Fibroid    Galactorrhea of right breast 2008   GERD (gastroesophageal reflux disease)    H/O amenorrhea 07/2007   H/O dizziness 10/14/2011   H/O dysmenorrhea 2010   H/O menorrhagia 10/14/2011   H/O varicella    Headache(784.0)    Heavy vaginal bleeding due to contraceptive injection use 10/12/2011   Depo Provera    Herpes    Homelessness 11/19/2021   HSV-2 infection 01/03/2009   Hx: UTI (urinary tract infection) 2009   Hypertension    on aldomet   Hypoalbuminemia due to protein-calorie malnutrition 06/26/2021   Increased BMI 2010   Irregular uterine bleeding 04/04/2012   Pt has mirena    Obesity 10/14/2011   Oligomenorrhea 07/2007   Osteomyelitis of great toe of right foot (HCC) 03/22/2022   Pelvic pain in female    Presence of permanent cardiac pacemaker    Preterm labor    PTSD (post-traumatic stress disorder)    Trichomonas    Yeast infection     Assessment: Patient Reported Symptoms:  Cognitive Cognitive Status: Able to follow simple commands, Alert and oriented to person, place, and time, Normal speech and language  skills Cognitive/Intellectual Conditions Management [RPT]: None reported or documented in medical history or problem list      Neurological Neurological Review of Symptoms: Not assessed    HEENT HEENT Symptoms Reported: Eye discharge, Eye pain HEENT Management Strategies: Medication therapy, Routine screening HEENT Comment: Patient reports she has had issues with pink eye bilaterally for the past couple of weeks. She has seen her opthalmologist and is currently on steroid eye drops 3x/day. Patient reports she is on day 3 out of 7 for her eye drops. She will be following up with opthalmologist in 2 weeks. Vision problem(s)  Cardiovascular Cardiovascular Symptoms Reported: No symptoms reported Does patient have uncontrolled Hypertension?: No Cardiovascular Management Strategies: Medical device, Medication therapy, Routine screening (Pacemaker) Do You Have a Working Readable Scale?: No Cardiovascular Comment: Patient reports she has not received scale. She reports she will call pharmacy to see if they have order  Respiratory Respiratory Symptoms Reported: Shortness of breath, Other: Other Respiratory Symptoms: Patient reports coughing on occasion Respiratory Management Strategies: Medication therapy, Routine screening  Endocrine Endocrine Symptoms Reported: Increased thirst, Increased urination, Weakness or fatigue Is patient diabetic?: Yes Is patient checking blood sugars at home?: Yes List most recent blood sugar readings, include date and time of day: Dexcom G7, 230 at time of visit. Patient reports fasting blood sugar as low as 120 Endocrine Comment: Patient reports she has restarted Dexcom G7 and Omnipod  for the past week. Notes that prior to this her sugars were as high as 600, but have started to come back down. Patient does have an upcoming visit with endocrinology.  Gastrointestinal Gastrointestinal Symptoms Reported: Constipation Additional Gastrointestinal Details: Patient reports  continued issues with constipation. She reports last BM was this morning. She feels that since she has been back on her insulin , her bowels have become more regular Gastrointestinal Management Strategies: Diet modification    Genitourinary Genitourinary Symptoms Reported: Other Other Genitourinary Symptoms: Increased urination Additional Genitourinary Details: Patient reports yeast infections have cleared up. Genitourinary Management Strategies: Coping strategies  Integumentary Integumentary Symptoms Reported: Not assessed    Musculoskeletal Musculoskelatal Symptoms Reviewed: Unsteady gait Musculoskeletal Management Strategies: Medical device, Adequate rest (Rollator, L BKA prosthesis) Musculoskeletal Comment: Denies falls since preivous CMRN visit Falls in the past year?: Yes Number of falls in past year: 2 or more Was there an injury with Fall?: No Fall Risk Category Calculator: 2 Patient Fall Risk Level: Moderate Fall Risk Patient at Risk for Falls Due to: History of fall(s), Orthopedic patient Fall risk Follow up: Falls evaluation completed, Education provided, Falls prevention discussed  Psychosocial Psychosocial Symptoms Reported: No symptoms reported          09/07/2024    PHQ2-9 Depression Screening   Little interest or pleasure in doing things    Feeling down, depressed, or hopeless    PHQ-2 - Total Score    Trouble falling or staying asleep, or sleeping too much    Feeling tired or having little energy    Poor appetite or overeating     Feeling bad about yourself - or that you are a failure or have let yourself or your family down    Trouble concentrating on things, such as reading the newspaper or watching television    Moving or speaking so slowly that other people could have noticed.  Or the opposite - being so fidgety or restless that you have been moving around a lot more than usual    Thoughts that you would be better off dead, or hurting yourself in some way     PHQ2-9 Total Score    If you checked off any problems, how difficult have these problems made it for you to do your work, take care of things at home, or get along with other people    Depression Interventions/Treatment      There were no vitals filed for this visit.  Medications Reviewed Today     Reviewed by Arno Rosaline SQUIBB, RN (Registered Nurse) on 09/07/24 at 1342  Med List Status: <None>   Medication Order Taking? Sig Documenting Provider Last Dose Status Informant  ARIPiprazole  (ABILIFY ) 15 MG tablet [id number]  Take 1 tablet (15 mg total) by mouth daily. Bahraini, Lauraine LABOR  Active   atorvastatin  (LIPITOR ) 40 MG tablet [id number]  Take 1 tablet (40 mg total) by mouth daily. Rolan Ezra RAMAN, MD  Active   carvedilol  (COREG ) 12.5 MG tablet [id number]  Take 1 tablet (12.5 mg total) by mouth 2 (two) times daily with a meal. Brien Belvie BRAVO, MD  Active   Continuous Glucose Sensor (DEXCOM G7 SENSOR) MISC [id number] Yes APPLY ONE SENSOR EVERY TEN DAYS AS DIRECTED Shamleffer, Ibtehal Jaralla, MD  Active   dapagliflozin  propanediol (FARXIGA ) 10 MG TABS tablet [id number] Yes Take 1 tablet (10 mg total) by mouth daily before breakfast. Glena Harlene HERO, FNP  Active   fluconazole  (DIFLUCAN ) 150 MG tablet [id number]  Take 1 tablet (  150 mg total) by mouth daily. Watkins, [place], [place]  Active   furosemide  (LASIX ) 20 MG tablet [id number]  Take 80 mg by mouth daily. [provider]  Active   GALAFOLD 123 MG CAPS [id number]  Take by mouth. [provider]  Active   hydrALAZINE  (APRESOLINE ) 50 MG tablet [id number]  Take one three times daily Brien Belvie BRAVO, MD  Active   hydrOXYzine  (ATARAX ) 25 MG tablet [id number]  Take 25 mg by mouth daily as needed. [provider]  Active   Insulin  Disposable Pump (OMNIPOD 5 DEXG7G6 PODS GEN 5) MISC [id number] Yes Change pod every other day Shamleffer, Ibtehal Jaralla, MD  Active   insulin  glargine (LANTUS  SOLOSTAR) 100 UNIT/ML Solostar  Pen [id number]  Inject 40 Units into the skin daily.  Patient not taking: Reported on 09/07/2024   Shamleffer, Ibtehal Jaralla, MD  Active   insulin  lispro (HUMALOG ) 100 UNIT/ML KwikPen [id number]  Inject 14 Units into the skin 3 (three) times daily. Before meals daily  Patient not taking: Reported on 09/07/2024   Shamleffer, Donell Cardinal, MD  Active   Insulin  Pen Needle 31G X 5 MM MISC [id number]  Use as directed in the morning, at noon, in the evening, and at bedtime. Shamleffer, Ibtehal Jaralla, MD  Active   isosorbide  dinitrate (ISORDIL ) 20 MG tablet [id number]  Take 1 tablet (20 mg total) by mouth 2 (two) times daily. Newlin, Enobong, MD  Active   Misc. Devices MISC [id number]  Scale.  Diagnosis-CHF Newlin, Enobong, MD  Active   olopatadine  (PATANOL) 0.1 % ophthalmic solution [id number]  Place 1 drop into both eyes 2 (two) times daily. Vicci Barnie NOVAK, MD  Active   Silver [facility] AG MESH) 4X5 PADS [id number]  Apply 1 Device topically daily in the afternoon. Shamleffer, Ibtehal Jaralla, MD  Active   spironolactone  (ALDACTONE ) 25 MG tablet [id number]  Take 0.5 tablets (12.5 mg total) by mouth daily. Rolan Ezra RAMAN, MD  Active   terconazole  (TERAZOL 3 ) 0.8 % vaginal cream [id number]  Place 1 applicator vaginally at bedtime. Newlin, Enobong, MD  Active   trimethoprim -polymyxin b  (POLYTRIM ) ophthalmic solution [id number]  Place 1 drop into both eyes every 6 (six) hours. While awake. Vicci Barnie NOVAK, MD  Active   valsartan  (DIOVAN ) 320 MG tablet [id number]  Take 1 tablet (320 mg total) by mouth daily. Brien Belvie BRAVO, MD  Active   varenicline  (CHANTIX  CONTINUING MONTH PAK) 1 MG tablet [id number]  Take 1 tablet (1 mg total) by mouth 2 (two) times daily. Rolan Ezra RAMAN, MD  Active   VENTOLIN  HFA 108 [id number] Base) MCG/ACT inhaler [id number]  INHALE 2 PUFFS INTO THE LUNGS EVERY 6 (SIX) HOURS AS NEEDED FOR WHEEZING OR SHORTNESS OF BREATH. Newlin, Enobong, MD  Active             Recommendation:    Specialty provider follow-up endocrinology 10/02/24 Continue Current Plan of Care  Follow Up Plan:   Telephone follow up appointment date/time:  10/05/24 at 1:30 PM  [name], RN MSN [place]  [facility] RN Care Manager Direct Dial : [phone]  Fax: [phone]

## 2024-09-07 NOTE — Patient Instructions (Signed)
 Visit Information  Ms. Gettinger was given information about Medicaid Managed Care team care coordination services as a part of their [employer] Medicaid benefit.   If you would like to schedule transportation through your [employer] [facility] plan, please call the following number at least 2 days in advance of your appointment: [phone]  If you are experiencing a behavioral health crisis, call the [employer] [place]  Behavioral Health Crisis Line at [phone]). The line is available 24 hours a day, seven days a week.    Patient verbalizes understanding of instructions and care plan provided today and agrees to view in MyChart. Active MyChart status and patient understanding of how to access instructions and care plan via MyChart confirmed with patient.     Telephone follow up appointment with Managed Medicaid care management team member scheduled for: 10/05/24 at 1:30 PM  [name], RN MSN [place]  [facility] RN Care Manager Direct Dial : [phone]  Fax: [phone]   Following is a copy of your plan of care:  There are no care plans that you recently modified to display for this patient.

## 2024-09-18 NOTE — Progress Notes (Signed)
 Pt attended 08/01/2024 screening event with BP of 144/96.  Per initial f/u pt was reached out via phone and was left vm. Pt was also mailed letter with Get Care Now flyer, multiple community primary care clinics flyer, food & transportation resources, [place] [address] card, BP management flyer, and BP log.  Per chart review pt does have a PCP (Corrina Sabin; [facility]), insurance, and is a former smoker. Pt's last appt with PCP was 08/28/2024. Pt does indicate food & transportation SDOH needs at this time.  Additional pt f/u to be scheduled at this time per health equity protocol.

## 2024-10-02 ENCOUNTER — Ambulatory Visit: Admitting: Internal Medicine

## 2024-10-02 ENCOUNTER — Encounter: Payer: Self-pay | Admitting: Internal Medicine

## 2024-10-02 VITALS — BP 124/78 | HR 100 | Ht 64.0 in | Wt 227.0 lb

## 2024-10-02 DIAGNOSIS — Z794 Long term (current) use of insulin: Secondary | ICD-10-CM

## 2024-10-02 DIAGNOSIS — Z89512 Acquired absence of left leg below knee: Secondary | ICD-10-CM

## 2024-10-02 DIAGNOSIS — E1142 Type 2 diabetes mellitus with diabetic polyneuropathy: Secondary | ICD-10-CM

## 2024-10-02 DIAGNOSIS — E1165 Type 2 diabetes mellitus with hyperglycemia: Secondary | ICD-10-CM

## 2024-10-02 DIAGNOSIS — Z89411 Acquired absence of right great toe: Secondary | ICD-10-CM

## 2024-10-02 LAB — POCT GLYCOSYLATED HEMOGLOBIN (HGB A1C): Hemoglobin A1C: 12.9 % — AB (ref 4.0–5.6)

## 2024-10-02 MED ORDER — OMNIPOD 5 DEXG7G6 PODS GEN 5 MISC: 1.0000 | 3 refills | Status: DC

## 2024-10-02 MED ORDER — DEXCOM G7 SENSOR MISC: 1.0000 | 3 refills | Status: DC

## 2024-10-02 NOTE — Patient Instructions (Addendum)

## 2024-10-02 NOTE — Progress Notes (Unsigned)
 Name: Sarah Long  MRN/ DOB: [id number], 11-13-1971   Age/ Sex: 53 y.o., female    PCP: Delbert Clam, MD   Reason for Endocrinology Evaluation: Type 2 Diabetes Mellitus     Date of Initial Endocrinology Visit: 11/02/2021    PATIENT IDENTIFIER: Sarah Long is a 53 y.o. female with a past medical history of HTN , Bipolar d/o,and T2DM, has Hx of Pancreatitis , fabry's disease . The patient presented for initial endocrinology clinic visit on 11/02/2021 for consultative assistance with her diabetes management.    HPI: Sarah Long was    Diagnosed with DM at age 73 stated as gestational diabetes by age 46 she became diagnosed with T2 DM  Prior Medications tried/Intolerance: Intolerant to Metformin .  Hemoglobin A1c has ranged from 7.4% in 2022, peaking at 13.8% in 2014. Patient has required hospitalization within the last 1 year from hyper or hypoglycemia: DKA 06/2021   On her initial visit to our clinic she had an A1c 7.4%, we continued MDI regimen and provided her with correction scale    She was trained on OmniPod 07/2023  We discontinued the OmniPod and started her on basal/prandial insulin  in June, 2025 with an A1c of 10.0%  SUBJECTIVE:   During the last visit (05/29/2024): A1c 10.0%     Today (10/02/24): Sarah Long is here for a follow up on diabetes management. She checks her  blood sugars multiple times daily through CGM.  The patient has not had hypoglycemic episodes since the last clinic visit.  Patient continues to follow-up with behavioral health for bipolar disorder and depression She follows with cardiology for CHF She is on Galafold for Fabry's disease through [facility]  Works 8 pm to 8 AM   She was post to switch from the OmniPod to basal/prandial insulin , but she comes in today with the OmniPod   She did have an episodes of vomiting a couple of weeks ago but this has resolved  Has occasional diarrhea   This patient with type 2 diabetes is treated with  Omnipod (insulin  pump). During the visit the pump basal and bolus doses were reviewed including carb/insulin  rations and supplemental doses. The clinical list was updated. The glucose meter download was reviewed in detail to determine if the current pump settings are providing the best glycemic control without excessive hypoglycemia.   Pump and meter download:               Pump   OmniPod Settings   Insulin  type   Humalog     Basal rate          0000 2.10u/h          I:C ratio          0000  1:1      Enter 14                        Sensitivity          0000  20         Goal          0000  110              Type & Model of Pump: OmniPod Insulin  Type: Currently using Humalog .      PUMP STATISTICS: Average BG: 240 Average Daily Carbs (g): 20.3 Average Total Daily Insulin : 77.5 Average Daily Basal: 48.8(63 %) Average Daily Bolus: 28.6 (37%)     HOME DIABETES REGIMEN: Lantus  40  units daily-70 units as needed  Humalog  14 units with each meal     Statin: no ACE-I/ARB: no Prior Diabetic Education: yes  CONTINUOUS GLUCOSE MONITORING RECORD INTERPRETATION    Dates of Recording: 10/10-10/23/2025  Sensor description: Dexcom G7  Results statistics:   CGM use % of time 46.7  Average and SD 240/88  Time in range 33 %  % Time Above 180 24  % Time above 250 43  % Time Below target 0   Glycemic patterns summary: No data for the week of Sunday, October 19, prior to that her BGs are high overnight and trend down with fluctuating BG's throughout the day  Hyperglycemic episodes mainly postprandial, but at times throughout the day  Hypoglycemic episodes occurred N/A  Overnight periods: Mostly high     DIABETIC COMPLICATIONS: Microvascular complications:  Left BKA, Right  great toe amputation , neuropathy  Denies: CKD, retinopathy  Last eye exam: Completed 06/12/2024  Macrovascular complications:  PAD Denies: CAD, CVA   PAST HISTORY: Past Medical  History:  Past Medical History:  Diagnosis Date   Abnormal Pap smear 1998   Abnormal vaginal bleeding 12/19/2011   Acute urinary retention 06/26/2021   Arthritis    Bacterial infection    Bipolar 1 disorder (HCC)    Blister of second toe of left foot 11/21/2016   Candida vaginitis 07/2007   Depression    recently added wellbutrin-has not taken yet for bipolar   Diabetes in pregnancy    Diabetes mellitus    nph 20U qam and qpm, regular with meals   Diabetic ketoacidosis without coma associated with type 2 diabetes mellitus (HCC)    Fibroid    Galactorrhea of right breast 2008   GERD (gastroesophageal reflux disease)    H/O amenorrhea 07/2007   H/O dizziness 10/14/2011   H/O dysmenorrhea 2010   H/O menorrhagia 10/14/2011   H/O varicella    Headache(784.0)    Heavy vaginal bleeding due to contraceptive injection use 10/12/2011   Depo Provera    Herpes    Homelessness 11/19/2021   HSV-2 infection 01/03/2009   Hx: UTI (urinary tract infection) 2009   Hypertension    on aldomet   Hypoalbuminemia due to protein-calorie malnutrition 06/26/2021   Increased BMI 2010   Irregular uterine bleeding 04/04/2012   Pt has mirena    Obesity 10/14/2011   Oligomenorrhea 07/2007   Osteomyelitis of great toe of right foot (HCC) 03/22/2022   Pelvic pain in female    Presence of permanent cardiac pacemaker    Preterm labor    PTSD (post-traumatic stress disorder)    Trichomonas    Yeast infection    Past Surgical History:  Past Surgical History:  Procedure Laterality Date   ABDOMINAL AORTOGRAM W/LOWER EXTREMITY N/A 06/17/2021   Procedure: ABDOMINAL AORTOGRAM W/LOWER EXTREMITY;  Surgeon: Darron Deatrice LABOR, MD;  Location: [facility];  Service: Cardiovascular;  Laterality: N/A;   AMPUTATION Left 06/27/2021   Procedure: AMPUTATION BELOW KNEE;  Surgeon: Harden Jerona GAILS, MD;  Location: [facility] OR;  Service: Orthopedics;  Laterality: Left;   AMPUTATION Right 09/04/2021   Procedure: RIGHT GREAT  TOE AMPUTATION;  Surgeon: Harden Jerona GAILS, MD;  Location: [facility] OR;  Service: Orthopedics;  Laterality: Right;   CESAREAN SECTION  1991   LEFT HEART CATH AND CORONARY ANGIOGRAPHY N/A 02/10/2020   Procedure: LEFT HEART CATH AND CORONARY ANGIOGRAPHY;  Surgeon: Burnard Debby LABOR, MD;  Location: [facility];  Service: Cardiovascular;  Laterality: N/A;  PACEMAKER IMPLANT N/A 02/13/2020   Procedure: PACEMAKER IMPLANT;  Surgeon: Inocencio Soyla Lunger, MD;  Location: [facility];  Service: Cardiovascular;  Laterality: N/A;   TEMPORARY PACEMAKER N/A 02/10/2020   Procedure: TEMPORARY PACEMAKER;  Surgeon: Burnard Debby LABOR, MD;  Location: [facility] INV[facility] CV LAB;  Service: Cardiovascular;  Laterality: N/A;    Social History:  reports that she quit smoking about 3 years ago. Her smoking use included cigarettes. She started smoking about 23 years ago. She has a 10 pack-year smoking history. She has never used smokeless tobacco. She reports that she does not currently use alcohol. She reports that she does not use drugs. Family History:  Family History  Problem Relation Age of Onset   Hypertension Mother    Diabetes Mother    Heart disease Mother    Hypertension Father    Diabetes Father    Heart disease Father    Depression Maternal Grandmother    Suicidality Maternal Grandmother    Stroke Maternal Grandfather    Schizophrenia Paternal Grandmother    Other Neg Hx    Breast cancer Neg Hx      HOME MEDICATIONS: Allergies as of 10/02/2024       Reactions   Lamictal  [lamotrigine ] Itching   Strawberry Extract Hives   Sulfa Antibiotics Hives, Itching        Medication List        Accurate as of October 02, 2024 10:36 AM. If you have any questions, ask your nurse or doctor.          ARIPiprazole  15 MG tablet Commonly known as: ABILIFY  Take 1 tablet (15 mg total) by mouth daily.   atorvastatin  40 MG tablet Commonly known as: LIPITOR  Take 1 tablet (40 mg total) by mouth daily.    carvedilol  12.5 MG tablet Commonly known as: COREG  Take 1 tablet (12.5 mg total) by mouth 2 (two) times daily with a meal.   dapagliflozin  propanediol 10 MG Tabs tablet Commonly known as: Farxiga  Take 1 tablet (10 mg total) by mouth daily before breakfast.   Dexcom G7 Sensor Misc APPLY ONE SENSOR EVERY TEN DAYS AS DIRECTED   fluconazole  150 MG tablet Commonly known as: DIFLUCAN  Take 1 tablet (150 mg total) by mouth daily.   furosemide  20 MG tablet Commonly known as: LASIX  Take 80 mg by mouth daily.   Galafold 123 MG Caps Generic drug: migALAstat HCl Take by mouth.   hydrALAZINE  50 MG tablet Commonly known as: APRESOLINE  Take one three times daily   hydrOXYzine  25 MG tablet Commonly known as: ATARAX  Take 25 mg by mouth daily as needed.   insulin  lispro 100 UNIT/ML KwikPen Commonly known as: HUMALOG  Inject 14 Units into the skin 3 (three) times daily. Before meals daily   Insulin  Pen Needle 31G X 5 MM Misc Use as directed in the morning, at noon, in the evening, and at bedtime.   isosorbide  dinitrate 20 MG tablet Commonly known as: ISORDIL  Take 1 tablet (20 mg total) by mouth 2 (two) times daily.   Lantus  SoloStar 100 UNIT/ML Solostar Pen Generic drug: insulin  glargine Inject 40 Units into the skin daily.   Misc. Devices Misc Scale.  Diagnosis-CHF   olopatadine  0.1 % ophthalmic solution Commonly known as: PATANOL Place 1 drop into both eyes 2 (two) times daily.   Omnipod 5 DexG7G6 Pods Gen 5 Misc Change pod every other day   spironolactone  25 MG tablet Commonly known as: ALDACTONE  Take 0.5 tablets (12.5 mg total) by mouth  daily.   Tegaderm Ag Mesh 4X5 Pads Apply 1 Device topically daily in the afternoon.   terconazole  0.8 % vaginal cream Commonly known as: TERAZOL 3  Place 1 applicator vaginally at bedtime.   trimethoprim -polymyxin b  ophthalmic solution Commonly known as: Polytrim  Place 1 drop into both eyes every 6 (six) hours. While awake.    valsartan  320 MG tablet Commonly known as: DIOVAN  Take 1 tablet (320 mg total) by mouth daily.   varenicline  1 MG tablet Commonly known as: Chantix  Continuing Month Pak Take 1 tablet (1 mg total) by mouth 2 (two) times daily.   Ventolin  HFA 108 (90 Base) MCG/ACT inhaler Generic drug: albuterol  INHALE 2 PUFFS INTO THE LUNGS EVERY 6 (SIX) HOURS AS NEEDED FOR WHEEZING OR SHORTNESS OF BREATH.         ALLERGIES: Allergies  Allergen Reactions   Lamictal  [Lamotrigine ] Itching   Strawberry Extract Hives   Sulfa Antibiotics Hives and Itching     REVIEW OF SYSTEMS: A comprehensive ROS was conducted with the patient and is negative except as per HPI     OBJECTIVE:   VITAL SIGNS: BP 124/78 (BP Location: Left Arm, Patient Position: Sitting, Cuff Size: Normal)   Pulse 100   Ht 5' 4 (1.626 m)   Wt 227 lb (103 kg)   SpO2 99%   BMI 38.96 kg/m    PHYSICAL EXAM:  General: Pt appears well and is in NAD  Lungs: Clear with good BS bilat   Heart: RRR   Extremities: Left BKA   Neuro: MS is good with appropriate affect, pt is alert and Ox3   DM Foot Exam 06/30/2023  Left below-knee amputation  Right great toe amputation  the pedal pulses 1+ on right  The sensation is intact on the right   DATA REVIEWED:  Lab Results  Component Value Date   HGBA1C 12.9 (A) 10/02/2024   HGBA1C 10.0 (A) 05/23/2024   HGBA1C 10.2 (A) 11/25/2023    Latest Reference Range & Units 05/23/24 11:00  Sodium 134 - 144 mmol/L 134  Potassium 3.5 - 5.2 mmol/L 4.4  Chloride 96 - 106 mmol/L 101  CO2 20 - 29 mmol/L 18 (L)  Glucose 70 - 99 mg/dL 588 (H)  BUN 6 - 24 mg/dL 17  Creatinine 9.42 - 8.99 mg/dL 8.50 (H)  Calcium  8.7 - 10.2 mg/dL 8.8  BUN/Creatinine Ratio 9 - 23  11  eGFR >59 mL/min/1.73 43 (L)    ASSESSMENT / PLAN / RECOMMENDATIONS:   1) Type 2 Diabetes Mellitus, Poorly  controlled, With neuropathic complications,CKD III,  S/P Left BKA and Right great toe amputation  - Most recent A1c of  12.9 %. Goal A1c < 7.0 %.    -She was post to switch from the OmniPod to basal/prandial insulin , but she continues to use the OmniPod, unfortunately the OmniPod has not worked well for her as she uses it intermittently and her A1c has increased from 10.0% to 12.9% - She has a hx of Pancreatitis , not a candidate for DPP 4 inhibitors and GLP-1 agonist - Has hx of DKA , would avoid SGLT2 inhibitors - Pt will switch to multiple daily injections of insulin   - She was advised to use skin tac and tegaderm to improve adherence of dexcom      MEDICATIONS: - Stop Omnipod  - Take lantus  40 units daily  - Take Humalog  14 units TIDQAC   EDUCATION / INSTRUCTIONS: BG monitoring instructions: Patient is instructed to check her blood sugars 3  times a day, before meals . Call [facility] if: BG persistently < 70  I reviewed the Rule of 15 for the treatment of hypoglycemia in detail with the patient. Literature supplied.   2) Diabetic complications:  Eye: Does not have known diabetic retinopathy.  Neuro/ Feet: Does  have known diabetic peripheral neuropathy. Renal: Patient does not have known baseline CKD. She is not on an ACEI/ARB at present.   Follow-up in 3 months     Signed electronically by: Stefano Redgie Butts, MD  [facility] Endocrinology  [facility] [address] [place], [place] [address] Phone: [phone] FAX: [phone]   CC: Delbert Clam, MD [address] [place] [address] Phone: [phone]  Fax: [phone]    Return to Endocrinology clinic as below: Future Appointments  Date Time Provider Department Center  10/05/2024  1:30 PM Steffens, Rosaline SQUIBB, RN [facility] None

## 2024-10-05 ENCOUNTER — Other Ambulatory Visit: Payer: Self-pay

## 2024-10-05 NOTE — Patient Outreach (Signed)
 Complex Care Management   Visit Note  10/05/2024  Name:  Sarah Long MRN: [id number] DOB: 1971-10-27  Situation: Referral received for Complex Care Management related to Heart Failure and Diabetes with Complications I obtained verbal consent from Patient.  Visit completed with Patient  on the phone  Background:   Past Medical History:  Diagnosis Date   Abnormal Pap smear 1998   Abnormal vaginal bleeding 12/19/2011   Acute urinary retention 06/26/2021   Arthritis    Bacterial infection    Bipolar 1 disorder (HCC)    Blister of second toe of left foot 11/21/2016   Candida vaginitis 07/2007   Depression    recently added wellbutrin-has not taken yet for bipolar   Diabetes in pregnancy    Diabetes mellitus    nph 20U qam and qpm, regular with meals   Diabetic ketoacidosis without coma associated with type 2 diabetes mellitus (HCC)    Fibroid    Galactorrhea of right breast 2008   GERD (gastroesophageal reflux disease)    H/O amenorrhea 07/2007   H/O dizziness 10/14/2011   H/O dysmenorrhea 2010   H/O menorrhagia 10/14/2011   H/O varicella    Headache(784.0)    Heavy vaginal bleeding due to contraceptive injection use 10/12/2011   Depo Provera    Herpes    Homelessness 11/19/2021   HSV-2 infection 01/03/2009   Hx: UTI (urinary tract infection) 2009   Hypertension    on aldomet   Hypoalbuminemia due to protein-calorie malnutrition 06/26/2021   Increased BMI 2010   Irregular uterine bleeding 04/04/2012   Pt has mirena    Obesity 10/14/2011   Oligomenorrhea 07/2007   Osteomyelitis of great toe of right foot (HCC) 03/22/2022   Pelvic pain in female    Presence of permanent cardiac pacemaker    Preterm labor    PTSD (post-traumatic stress disorder)    Trichomonas    Yeast infection     Assessment: Patient Reported Symptoms:  Cognitive Cognitive Status: Able to follow simple commands, Alert and oriented to person, place, and time, Normal speech and language  skills Cognitive/Intellectual Conditions Management [RPT]: None reported or documented in medical history or problem list   Health Maintenance Behaviors: Annual physical exam, Healthy diet Health Facilitated by: Healthy diet, Rest  Neurological Neurological Review of Symptoms: Not assessed    HEENT HEENT Symptoms Reported: No symptoms reported HEENT Management Strategies: Routine screening HEENT Comment: Patient reports pink eye is relieved. She has a follow-up coming up. Vision problem(s)  Cardiovascular Cardiovascular Symptoms Reported: Chest pain or discomfort Does patient have uncontrolled Hypertension?: No Is patient checking Blood Pressure at home?: Yes Patient's Recent BP reading at home: Does not remember, 124/78 at most recent office visit Cardiovascular Management Strategies: Weight management, Medication therapy, Routine screening Do You Have a Working Readable Scale?: No Cardiovascular Comment: Patient reports she still has not received scale. She reports [facility] claims they do not have an order for a scale. She has been weighing herself at provider appointments, pop-up clinics, and [employer]. Patient reports she was off furosemide  and Farxiga  for about a week or two due to forgetting ot take them. She is now getting medications in the blister packs, and reports better compliance. Patient reports chest pain where her pacemaker is that radiates to her back. Patient reports chest pain has been coming and going since this morning when she woke up. Patient is not prescribed nitroglycerin. Offered to start 3-way call with cardiology to report symptoms, patient reports she  will call herself.  Respiratory Respiratory Symptoms Reported: No symptoms reported Additional Respiratory Details: Patient reports last Albuterol  earlier this week. Respiratory Management Strategies: Medication therapy, Routine screening  Endocrine Endocrine Symptoms Reported: Increased thirst,  Headaches Is patient diabetic?: Yes Is patient checking blood sugars at home?: Yes List most recent blood sugar readings, include date and time of day: Dexcom G7, under 200 per patient Endocrine Comment: Patient reports continued compliance with Decom G7 and Omnipod. Per chart review, A1c increased to 12.9% at recent endocrinology visit 10/02/24. Patient reports visit was educational regarding Omnipod maintenance. She will follow-up with endocrinologist in another 3 months.  Gastrointestinal Gastrointestinal Symptoms Reported: No symptoms reported Additional Gastrointestinal Details: Patient reports appetite has been up and down. Gastrointestinal Management Strategies: Diet modification    Genitourinary Genitourinary Symptoms Reported: Not assessed    Integumentary Integumentary Symptoms Reported: Not assessed    Musculoskeletal Musculoskelatal Symptoms Reviewed: Not assessed        Psychosocial Psychosocial Symptoms Reported: Depression - if selected complete PHQ 2-9 Additional Psychological Details: Patient reports she has been feeling down recently. She reports continued compliance with medications and working with counselor. Behavioral Management Strategies: Coping strategies Techniques to Cope with Loss/Stress/Change: Counseling, Diversional activities      10/05/2024    PHQ2-9 Depression Screening   Little interest or pleasure in doing things Not at all  Feeling down, depressed, or hopeless Several days  PHQ-2 - Total Score 1  Trouble falling or staying asleep, or sleeping too much    Feeling tired or having little energy    Poor appetite or overeating     Feeling bad about yourself - or that you are a failure or have let yourself or your family down    Trouble concentrating on things, such as reading the newspaper or watching television    Moving or speaking so slowly that other people could have noticed.  Or the opposite - being so fidgety or restless that you have been  moving around a lot more than usual    Thoughts that you would be better off dead, or hurting yourself in some way    PHQ2-9 Total Score    If you checked off any problems, how difficult have these problems made it for you to do your work, take care of things at home, or get along with other people    Depression Interventions/Treatment      There were no vitals filed for this visit.  Medications Reviewed Today     Reviewed by Arno Rosaline SQUIBB, RN (Registered Nurse) on 10/05/24 at 1403  Med List Status: <None>   Medication Order Taking? Sig Documenting Provider Last Dose Status Informant  ARIPiprazole  (ABILIFY ) 15 MG tablet [id number]  Take 1 tablet (15 mg total) by mouth daily. Mercy Lauraine LABOR  Active   atorvastatin  (LIPITOR ) 40 MG tablet [id number]  Take 1 tablet (40 mg total) by mouth daily. Rolan Ezra RAMAN, MD  Active   carvedilol  (COREG ) 12.5 MG tablet [id number]  Take 1 tablet (12.5 mg total) by mouth 2 (two) times daily with a meal. Brien Belvie BRAVO, MD  Active   Continuous Glucose Sensor (DEXCOM G7 [place]) MISC [id number]  1 Device by Other route as directed. Every 10 days Shamleffer, Ibtehal Jaralla, MD  Active   dapagliflozin  propanediol (FARXIGA ) 10 MG TABS tablet [id number]  Take 1 tablet (10 mg total) by mouth daily before breakfast. Glena Harlene HERO, FNP  Active   fluconazole  (DIFLUCAN ) 150 MG tablet [id number]  Take 1 tablet (150 mg total) by mouth daily. [place], [place], [place]  Active   furosemide  (LASIX ) 20 MG tablet [id number]  Take 80 mg by mouth daily. [provider]  Active   GALAFOLD 123 MG CAPS [id number]  Take by mouth. [provider]  Active   hydrALAZINE  (APRESOLINE ) 50 MG tablet [id number]  Take one three times daily Brien Belvie BRAVO, MD  Active   hydrOXYzine  (ATARAX ) 25 MG tablet [id number]  Take 25 mg by mouth daily as needed. [provider]  Active   Insulin  Disposable Pump (OMNIPOD 5 DEXG7G6 PODS GEN 5) MISC [id number]  1 Device by  Other route every other day. Change pod every other day Shamleffer, Ibtehal Jaralla, MD  Active   insulin  glargine (LANTUS  SOLOSTAR) 100 UNIT/ML Solostar Pen [id number]  Inject 40 Units into the skin daily.  Patient not taking: Reported on 10/02/2024   Shamleffer, Ibtehal Jaralla, MD  Active   insulin  lispro (HUMALOG ) 100 UNIT/ML KwikPen [id number]  Inject 14 Units into the skin 3 (three) times daily. Before meals daily  Patient not taking: Reported on 10/02/2024   Shamleffer, Ibtehal Jaralla, MD  Active   Insulin  Pen Needle 31G X 5 MM MISC [id number]  Use as directed in the morning, at noon, in the evening, and at bedtime. Shamleffer, Ibtehal Jaralla, MD  Active   isosorbide  dinitrate (ISORDIL ) 20 MG tablet [id number]  Take 1 tablet (20 mg total) by mouth 2 (two) times daily. Newlin, Enobong, MD  Active   Misc. Devices MISC [id number]  Scale.  Diagnosis-CHF Newlin, Enobong, MD  Active   olopatadine  (PATANOL) 0.1 % ophthalmic solution [id number]  Place 1 drop into both eyes 2 (two) times daily. Vicci Barnie NOVAK, MD  Active   [place] [facility] AG MESH) 4X5 PADS [id number]  Apply 1 Device topically daily in the afternoon. Shamleffer, Ibtehal Jaralla, MD  Active   spironolactone  (ALDACTONE ) 25 MG tablet [id number]  Take 0.5 tablets (12.5 mg total) by mouth daily. Rolan Ezra RAMAN, MD  Active   terconazole  (TERAZOL 3 ) 0.8 % vaginal cream [id number]  Place 1 applicator vaginally at bedtime. Newlin, Enobong, MD  Active   trimethoprim -polymyxin b  (POLYTRIM ) ophthalmic solution [id number]  Place 1 drop into both eyes every 6 (six) hours. While awake. Vicci Barnie NOVAK, MD  Active   valsartan  (DIOVAN ) 320 MG tablet [id number]  Take 1 tablet (320 mg total) by mouth daily. Brien Belvie BRAVO, MD  Active   varenicline  (CHANTIX  CONTINUING MONTH PAK) 1 MG tablet [id number]  Take 1 tablet (1 mg total) by mouth 2 (two) times daily. Rolan Ezra RAMAN, MD  Active   VENTOLIN  HFA 108 [phone] Base) MCG/ACT inhaler [id number]  INHALE 2  PUFFS INTO THE LUNGS EVERY 6 (SIX) HOURS AS NEEDED FOR WHEEZING OR SHORTNESS OF BREATH. Newlin, Enobong, MD  Active             Recommendation:   Specialty provider follow-up cardiology - advised patient to call and report current symptoms Continue Current Plan of Care  Follow Up Plan:   Telephone follow-up in 1 week  [name], RN MSN [place]  [facility] RN Care Manager Direct Dial : [phone]  Fax: [phone]

## 2024-10-05 NOTE — Patient Instructions (Signed)
 Visit Information  Ms. Galvis was given information about Medicaid Managed Care team care coordination services as a part of their [employer] Medicaid benefit.   If you would like to schedule transportation through your [employer] [facility] plan, please call the following number at least 2 days in advance of your appointment: [phone]  If you are experiencing a behavioral health crisis, call the [employer] [place]  Behavioral Health Crisis Line at [phone]). The line is available 24 hours a day, seven days a week.    Care plan and visit instructions communicated with the patient verbally today. Patient agrees to receive a copy in MyChart. Active MyChart status and patient understanding of how to access instructions and care plan via MyChart confirmed with patient.     The Patient                                              will call cardiology* as advised to report symptoms of chest pain.  RN Care Manager will follow-up regarding scale, sleep study, and PFTs The Managed Medicaid care management team will reach out to the patient again over the next 7 days.    [name], RN MSN [place]  [facility] RN Care Manager Direct Dial : [phone]  Fax: [phone]   Following is a copy of your plan of care:  There are no care plans that you recently modified to display for this patient.

## 2024-10-08 ENCOUNTER — Encounter: Payer: Self-pay | Admitting: Radiology

## 2024-10-11 ENCOUNTER — Other Ambulatory Visit: Payer: Self-pay

## 2024-10-11 DIAGNOSIS — I5042 Chronic combined systolic (congestive) and diastolic (congestive) heart failure: Secondary | ICD-10-CM

## 2024-10-11 MED ORDER — MISC. DEVICES MISC: 0 refills | Status: AC

## 2024-10-12 ENCOUNTER — Telehealth: Payer: Self-pay

## 2024-10-12 NOTE — Patient Outreach (Signed)
 Care Coordination   10/12/2024 Name: Sarah Long MRN: [id number] DOB: 03/18/1971   Care Coordination Outreach Attempts:  An unsuccessful telephone outreach was attempted today to follow-up with patient regarding report of chest pain, order for scale, and orders for PFT and sleep study.  Follow Up Plan:  Additional outreach attempts will be made to contact patient.   Encounter Outcome:  No Answer. HIPAA compliant voicemail left requesting return call and notifying patient that order for scale has been sent to [facility] as of 10/12/24.   [name], RN MSN [place]  [facility] Health RN Care Manager Direct Dial : [phone]  Fax: [phone]

## 2024-10-15 NOTE — Patient Outreach (Signed)
 Care Coordination   10/15/2024 Name: Sarah Long MRN: [id number] DOB: [birth year]   Care Coordination Outreach Attempts:  A second unsuccessful telephone outreach was attempted today to follow-up with patient regarding report of chest pain, order for scale, and orders for PFT and sleep study.  Follow Up Plan:  Additional outreach attempts will be made to contact patient.   Encounter Outcome:  No Answer. HIPAA compliant voicemail left requesting return call and notifying patient that order for scale has been sent to [facility].     [name], RN MSN [place]  [facility] Health RN Care Manager Direct Dial : [phone]  Fax: [phone]

## 2024-10-18 ENCOUNTER — Other Ambulatory Visit: Payer: Self-pay | Admitting: Critical Care Medicine

## 2024-10-18 DIAGNOSIS — I5042 Chronic combined systolic (congestive) and diastolic (congestive) heart failure: Secondary | ICD-10-CM

## 2024-10-18 NOTE — Patient Outreach (Signed)
 Care Coordination   10/18/2024 Name: Sarah Long MRN: [id number] DOB: August 19, 1971   Care Coordination Outreach Attempts: An unsuccessful telephone outreach was attempted today to follow-up with patient regarding report of chest pain, order for scale, and orders for PFT and sleep study.  Follow Up Plan:  No further outreach attempts will be made at this time. We have been unable to contact the patient.  Encounter Outcome:  No Answer. HIPAA compliant voicemail left requesting return call. Message sent via MyChart requesting return call.   [name], RN MSN [place]  [facility] Health RN Care Manager Direct Dial : [phone]  Fax: [phone]

## 2024-10-18 NOTE — Patient Instructions (Signed)
 Sarah Long Notice - I have attempted to call you three times but have been unsuccessful in reaching you. I work with Newlin, Enobong, MD and am calling to support your healthcare needs. Please contact me at [phone].     Thank you,  [name], RN MSN [place]  [facility] Health RN Care Manager Direct Dial : [phone]  Fax: [phone]

## 2024-10-19 ENCOUNTER — Telehealth: Payer: Self-pay

## 2024-10-19 NOTE — Patient Instructions (Addendum)
 Hi Ms. Balik,  It was great speaking with you today! As we discussed, your sleep study is scheduled 12/26/24 at [facility]. I am hopeful they will reach out to you as it gets closer to provide instructions. Their phone number is [phone].  Please be sure to return call to schedule PFTs. Their phone number is [phone].  I have scheduled our telephone follow-up 11/16/24 at 1 PM. Please let me know if you need anything before then.  Have a great weekend!  [name], RN MSN [place]  [facility] Health RN Care Manager Direct Dial : [phone]  Fax: [phone]

## 2024-10-19 NOTE — Patient Outreach (Signed)
 Spoke with patient for quick follow-up regarding scale, PFTs, and sleep study. Patient reports she went to the pharmacy and it turns out she just needed new batteries for her scale. She has been weighing daily, at different times, and getting fluctuating weight. Educated to weigh herself daily, first thing in the morning right after emptying the bladder. Advised monitoring for 2 lb weight gain overnight or 5 lb in a week.  Patient reports she has missed calls to schedule PFTs and will review her voicemail to call back. Phone number will be sent via MyChart. Advised patient of sleep study scheduled 12/26/24 and will provide number via MyChart to reschedule if needed.  CMRN follow-up scheduled 11/16/24 at 1 PM.  [name], RN MSN [place]  [facility] Health RN Care Manager Direct Dial : [phone]  Fax: [phone]

## 2024-11-07 ENCOUNTER — Other Ambulatory Visit: Payer: Self-pay | Admitting: Family Medicine

## 2024-11-16 ENCOUNTER — Telehealth: Payer: Self-pay

## 2024-11-16 NOTE — Patient Instructions (Signed)
 Marcus JONELLE Notice - I am sorry I was unable to reach you today for our scheduled appointment. I work with Newlin, Enobong, MD and am calling to support your healthcare needs. Please contact me at [phone] at your earliest convenience. I look forward to speaking with you soon.   Thank you,  [name], RN MSN [place]  [facility] Health RN Care Manager Direct Dial : [phone]  Fax: [phone]

## 2024-11-16 NOTE — Patient Outreach (Signed)
 Care Coordination   11/16/2024 Name: Sarah Long MRN: [id number] DOB: 08-06-1971   Care Coordination Outreach Attempts:  An unsuccessful outreach was attempted for an appointment today.  Follow Up Plan:  Additional outreach attempts will be made to complete CCM follow-up visit.   Encounter Outcome:  No Answer. HIPAA compliant voicemail left requesting return call.   [name], RN MSN [place]  [facility] Health RN Care Manager Direct Dial : [phone]  Fax: [phone]

## 2024-11-20 NOTE — Patient Outreach (Signed)
 Care Coordination   11/20/2024 Name: Sarah Long MRN: [id number] DOB: 1971-01-14   Care Coordination Outreach Attempts:  A second unsuccessful outreach was attempted today to complete CCM follow-up visit.  Follow Up Plan:  Additional outreach attempts will be made to complete follow-up visit.   Encounter Outcome:  No Answer. HIPAA compliant voicemail left requesting return call.   [name], RN MSN   [facility] Health RN Care Manager Direct Dial : [phone]  Fax: [phone]

## 2024-11-21 NOTE — Progress Notes (Signed)
 Pt attended 08/01/2024 screening event with BP of 144/96 and blood sugar was 167. Pt noted at event that she does have a PCP. At event pt did indicate food & transportation SDOH needs. Pt listed Medicaid as his insurance at the event.   Per 60 day f/u pt was reached out via phone and was left vm. Pt was mailed 2nd letter with Get Care Now flyer, multiple community primary care clinics flyer, food & transportation resources,  211 card, BP management flyer, and BP log.  Per chart review pt does have a PCP (Sarah Long; [facility]), insurance, and is a former smoker. Pt's last appt with different provider in PCP office was 08/28/2024. Pt is also seeing an Endocrinologist and had a previous appt on 10/02/2024 and an upcoming appt on 01/02/2025 with them. Pt does indicate food & transportation SDOH needs at this time.  Additional pt f/u to be scheduled at this time per health equity protocol.

## 2024-11-23 ENCOUNTER — Other Ambulatory Visit: Payer: Self-pay

## 2024-11-23 NOTE — Patient Outreach (Signed)
 Complex Care Management   Visit Note  11/23/2024  Name:  Sarah Long MRN: [id number] DOB: 1971-02-14  Situation: Referral received for Complex Care Management related to Heart Failure and Diabetes with Complications I obtained verbal consent from Patient.  Visit completed with Patient  on the phone  Background:   Past Medical History:  Diagnosis Date   Abnormal Pap smear 1998   Abnormal vaginal bleeding 12/19/2011   Acute urinary retention 06/26/2021   Arthritis    Bacterial infection    Bipolar 1 disorder (HCC)    Blister of second toe of left foot 11/21/2016   Candida vaginitis 07/2007   Depression    recently added wellbutrin-has not taken yet for bipolar   Diabetes in pregnancy    Diabetes mellitus    nph 20U qam and qpm, regular with meals   Diabetic ketoacidosis without coma associated with type 2 diabetes mellitus (HCC)    Fibroid    Galactorrhea of right breast 2008   GERD (gastroesophageal reflux disease)    H/O amenorrhea 07/2007   H/O dizziness 10/14/2011   H/O dysmenorrhea 2010   H/O menorrhagia 10/14/2011   H/O varicella    Headache(784.0)    Heavy vaginal bleeding due to contraceptive injection use 10/12/2011   Depo Provera    Herpes    Homelessness 11/19/2021   HSV-2 infection 01/03/2009   Hx: UTI (urinary tract infection) 2009   Hypertension    on aldomet   Hypoalbuminemia due to protein-calorie malnutrition 06/26/2021   Increased BMI 2010   Irregular uterine bleeding 04/04/2012   Pt has mirena    Obesity 10/14/2011   Oligomenorrhea 07/2007   Osteomyelitis of great toe of right foot (HCC) 03/22/2022   Pelvic pain in female    Presence of permanent cardiac pacemaker    Preterm labor    PTSD (post-traumatic stress disorder)    Trichomonas    Yeast infection     Assessment: Patient Reported Symptoms:  Cognitive Cognitive Status: Able to follow simple commands, Alert and oriented to person, place, and time, Normal speech and language  skills Cognitive/Intellectual Conditions Management [RPT]: None reported or documented in medical history or problem list   Health Maintenance Behaviors: Annual physical exam, Healthy diet Health Facilitated by: Healthy diet, Rest  Neurological Neurological Review of Symptoms: Headaches Neurological Management Strategies: Medication therapy Neurological Comment: Headache related to recent cold  HEENT HEENT Symptoms Reported: Nasal discharge HEENT Management Strategies: Medication therapy, Routine screening HEENT Comment: Nasal discharge due to recent cold    Cardiovascular Cardiovascular Symptoms Reported: No symptoms reported Does patient have uncontrolled Hypertension?: No Cardiovascular Management Strategies: Medication therapy, Routine screening Cardiovascular Comment: Patient reports she has not been weighing daily  Respiratory Respiratory Symptoms Reported: Other:, Productive cough Other Respiratory Symptoms: Patient reports she has been fighting a cold this week. She reports brown/yellow colored mucus production. She has been taking OTC cough relief and mucus relief Additional Respiratory Details: Reviewed indications to go to urgent care over the weekend Respiratory Management Strategies: Medication therapy, Routine screening  Endocrine Endocrine Symptoms Reported: Headaches, Weakness or fatigue Is patient diabetic?: Yes Is patient checking blood sugars at home?: Yes List most recent blood sugar readings, include date and time of day: Dexcom G7, 103 Endocrine Comment: Patient reports decreased intake due to recent cold, resulting in lower blood sugars  Gastrointestinal Gastrointestinal Symptoms Reported: Change in appetite Additional Gastrointestinal Details: Decreased appetite with recent cold      Genitourinary Genitourinary Symptoms Reported: No symptoms reported  Integumentary Integumentary Symptoms Reported: No symptoms reported    Musculoskeletal Musculoskelatal  Symptoms Reviewed: Unsteady gait Musculoskeletal Management Strategies: Medical device, Adequate rest (Rollator, L BKA prosthesis) Musculoskeletal Comment: Paitent denies falls since previous CMRN visit Falls in the past year?: Yes Number of falls in past year: 2 or more Was there an injury with Fall?: No Fall Risk Category Calculator: 2 Patient Fall Risk Level: Moderate Fall Risk Patient at Risk for Falls Due to: History of fall(s), Orthopedic patient Fall risk Follow up: Falls evaluation completed, Education provided, Falls prevention discussed  Psychosocial Psychosocial Symptoms Reported: Depression - if selected complete PHQ 2-9 Additional Psychological Details: Patient reports she missed an appointment with her counselor and has to pay the no show fee. Behavioral Management Strategies: Coping strategies, Counseling Major Change/Loss/Stressor/Fears (CP): Death of a loved one (Missing family members who have previously passed, especially during the holiday season) Techniques to [place] with Loss/Stress/Change: Counseling, Diversional activities Quality of Family Relationships: helpful, involved, supportive Do you feel physically threatened by others?: No    11/23/2024    PHQ2-9 Depression Screening   Little interest or pleasure in doing things Not at all  Feeling down, depressed, or hopeless Several days  PHQ-2 - Total Score 1  Trouble falling or staying asleep, or sleeping too much    Feeling tired or having little energy    Poor appetite or overeating     Feeling bad about yourself - or that you are a failure or have let yourself or your family down    Trouble concentrating on things, such as reading the newspaper or watching television    Moving or speaking so slowly that other people could have noticed.  Or the opposite - being so fidgety or restless that you have been moving around a lot more than usual    Thoughts that you would be better off dead, or hurting yourself in some way     PHQ2-9 Total Score    If you checked off any problems, how difficult have these problems made it for you to do your work, take care of things at home, or get along with other people    Depression Interventions/Treatment      There were no vitals filed for this visit.    Medications Reviewed Today     Reviewed by Arno Rosaline SQUIBB, RN (Registered Nurse) on 11/23/24 at 1346  Med List Status: <None>   Medication Order Taking? Sig Documenting Provider Last Dose Status Informant  ARIPiprazole  (ABILIFY ) 15 MG tablet [id number]  Take 1 tablet (15 mg total) by mouth daily. Mercy Lauraine LABOR  Active   atorvastatin  (LIPITOR ) 40 MG tablet [id number]  Take 1 tablet (40 mg total) by mouth daily. Rolan Ezra RAMAN, MD  Active   carvedilol  (COREG ) 12.5 MG tablet [id number]  TAKE 1 TABLET BY MOUTH 2 (TWO) TIMES DAILY WITH A MEAL (AM+PM) Newlin, Enobong, MD  Active   Continuous Glucose Sensor (DEXCOM G7 SENSOR) MISC [id number]  1 Device by Other route as directed. Every 10 days Shamleffer, Ibtehal Jaralla, MD  Active   dapagliflozin  propanediol (FARXIGA ) 10 MG TABS tablet [id number]  Take 1 tablet (10 mg total) by mouth daily before breakfast. Glena Harlene HERO, FNP  Active   fluconazole  (DIFLUCAN ) 150 MG tablet [id number]  Take 1 tablet (150 mg total) by mouth daily. Hughes, [place], [place]  Active   furosemide  (LASIX ) 20 MG tablet [id number]  Take 80 mg by mouth daily. [provider]  Active  GALAFOLD 123 MG CAPS [id number]  Take by mouth. [provider]  Active   hydrALAZINE  (APRESOLINE ) 50 MG tablet [id number]  Take one three times daily Brien Belvie BRAVO, MD  Active   hydrOXYzine  (ATARAX ) 25 MG tablet [id number]  Take 25 mg by mouth daily as needed. [provider]  Active   Insulin  Disposable Pump (OMNIPOD 5 DEXG7G6 PODS GEN 5) MISC [id number]  1 Device by Other route every other day. Change pod every other day Shamleffer, Ibtehal Jaralla, MD  Active   insulin  glargine (LANTUS   SOLOSTAR) 100 UNIT/ML Solostar Pen [id number]  Inject 40 Units into the skin daily.  Patient not taking: Reported on 10/02/2024   Shamleffer, Ibtehal Jaralla, MD  Active   insulin  lispro (HUMALOG ) 100 UNIT/ML KwikPen [id number]  Inject 14 Units into the skin 3 (three) times daily. Before meals daily  Patient not taking: Reported on 10/02/2024   Shamleffer, Ibtehal Jaralla, MD  Active   Insulin  Pen Needle 31G X 5 MM MISC [id number]  Use as directed in the morning, at noon, in the evening, and at bedtime. Shamleffer, Ibtehal Jaralla, MD  Active   isosorbide  dinitrate (ISORDIL ) 20 MG tablet [id number]  Take 1 tablet (20 mg total) by mouth 2 (two) times daily. Newlin, Enobong, MD  Active   Misc. Devices MISC [id number]  Scale.  Diagnosis-CHF Newlin, Enobong, MD  Active   olopatadine  (PATANOL) 0.1 % ophthalmic solution [id number]  Place 1 drop into both eyes 2 (two) times daily. Vicci Barnie NOVAK, MD  Active   Silver [facility] AG MESH) 4X5 PADS [id number]  Apply 1 Device topically daily in the afternoon. Shamleffer, Ibtehal Jaralla, MD  Active   spironolactone  (ALDACTONE ) 25 MG tablet [id number]  Take 0.5 tablets (12.5 mg total) by mouth daily. Rolan Ezra RAMAN, MD  Active   terconazole  (TERAZOL 3 ) 0.8 % vaginal cream [id number]  Place 1 applicator vaginally at bedtime. Newlin, Enobong, MD  Active   trimethoprim -polymyxin b  (POLYTRIM ) ophthalmic solution [id number]  Place 1 drop into both eyes every 6 (six) hours. While awake. Vicci Barnie NOVAK, MD  Active   valsartan  (DIOVAN ) 320 MG tablet [id number]  Take 1 tablet (320 mg total) by mouth daily. Brien Belvie BRAVO, MD  Active   varenicline  (CHANTIX  CONTINUING MONTH PAK) 1 MG tablet [id number]  Take 1 tablet (1 mg total) by mouth 2 (two) times daily. Rolan Ezra RAMAN, MD  Active   VENTOLIN  HFA 108 [phone] Base) MCG/ACT inhaler [id number]  INHALE 2 PUFFS INTO THE LUNGS EVERY 6 (SIX) HOURS AS NEEDED FOR WHEEZING OR SHORTNESS OF BREATH. Newlin, Enobong, MD  Active              Recommendation:   Continue Current Plan of Care  Follow Up Plan:   Telephone follow up appointment date/time:  12/21/24 at 1:30 PM  [name], RN MSN [place]  [facility] Health RN Care Manager Direct Dial : [phone]  Fax: [phone]

## 2024-11-23 NOTE — Patient Instructions (Addendum)
 Visit Information  Sarah Long was given information about Medicaid Managed Care team care coordination services as a part of their [employer] benefit.   If you would like to schedule transportation through your [facility] plan, please call the following number at least 2 days in advance of your appointment: [phone]  If you are experiencing a behavioral health crisis, call the [employer] [place]  Behavioral Health Crisis Line at [phone]). The line is available 24 hours a day, seven days a week.    Please see education materials related to Diabetes and sick day management provided by MyChart link.  Care plan and visit instructions communicated with the patient verbally today. Patient agrees to receive a copy in MyChart. Active MyChart status and patient understanding of how to access instructions and care plan via MyChart confirmed with patient.     Telephone follow up appointment with Managed Medicaid care management team member scheduled for: 12/21/24 at 1:30 PM  Sarah Finlay, RN MSN [place]  VBCI Population Health RN Care Manager Direct Dial : [phone]  Fax: [phone]   Following is a copy of your plan of care:   Goals Addressed             This Visit's Progress    VBCI RN Care Plan   On track    Problems:  Chronic Disease Management support and education needs related to CHF and DMII  Goal: Over the next 30 days the Patient will demonstrate Ongoing adherence to prescribed treatment plan for DMII as evidenced by patient report of fasting blood sugar readings of 80-130 Demonstrate improved adherence to prescribed treatment plan for CHF as evidenced by patient report of weighing daily, no symptoms related to fluid retention Schedule PFTs  Interventions:   Diabetes Interventions: Assessed patient's understanding of A1c goal: <7% Provided education to patient about basic DM  disease process Reviewed medications with patient and discussed importance of medication adherence Counseled on importance of regular laboratory monitoring as prescribed Discussed plans with patient for ongoing care management follow up and provided patient with direct contact information for care management team Provided patient with written educational materials related to hypo and hyperglycemia and importance of correct treatment Advised patient, providing education and rationale, to check cbg as advised by provider and record, calling endocrinology or PCP for findings outside established parameters Written information regarding diabetes sick day management sent via MyChart  Lab Results  Component Value Date   HGBA1C 12.9 (A) 10/02/2024    Evaluation of current treatment plan related to chronic illness,  self-management and patient's adherence to plan as established by provider. Discussed plans with patient for ongoing care management follow up and provided patient with direct contact information for care management team Reminded patient of upcoming sleep study scheduled 12/27/23 Advised patient to set a reminder on her phone to call and schedule PFTs Reviewed cold symptoms and management. Advised patient to go to urgent care this weekend for worsening symptoms or symptoms not improved with OTC medications. Advised drinking plenty of water  and small, frequent meals rather than large meals   Heart Failure Interventions: Basic overview and discussion of pathophysiology of Heart Failure reviewed Assessed need for readable accurate scales in home Provided education about placing scale on hard, flat surface Advised patient to weigh each morning after emptying bladder Discussed importance of daily weight and advised patient to weigh and record daily Reviewed role of diuretics in prevention of fluid overload and management of heart failure;  Patient Self-Care Activities:  Attend all  scheduled provider appointments Call pharmacy for medication refills 3-7 days in advance of running out of medications Call provider office for new concerns or questions  Take medications as prescribed   call office if I gain more than 2 pounds in one day or 5 pounds in one week track weight in diary use salt in moderation watch for swelling in feet, ankles and legs every day weigh myself daily check blood sugar at prescribed times: as advised by provider check feet daily for cuts, sores or redness enter blood sugar readings and medication or insulin  into daily log take the blood sugar log to all doctor visits drink 6 to 8 glasses of water  each day Schedule PFTs  Plan:  Telephone follow up appointment with care management team member scheduled for:  12/21/24 at 1:30 PM              Diabetes Mellitus and Sick Day Management Make a plan for sick days as part of your plan to manage your diabetes. Being sick can cause stress and loss of body fluids (dehydration). These problems can cause your level of blood sugar (glucose) to rise. Common illnesses that can cause problems for people with diabetes mellitus include colds, fever, nausea, vomiting, diarrhea, and the flu. The following information offers guidance on how to care for yourself when you are sick with an illness that lasts for about 24 hours or less. Your health care provider may also give you more specific instructions. How to manage your blood glucose while sick  Check your blood glucose every 2-4 hours, or as often as told by your health care provider. If you use insulin , take your usual dose. If your blood glucose is still too high, you may need to take an extra dose of insulin  as told by your health care provider. Know your treatment goals for sick days. Your target blood glucose levels may be different when you are sick. If you use a diabetes medicine that you take by mouth (orally), keep taking your medicines. Have a plan  with your health care provider for how to take: Your oral diabetes medicines. Hormone medicines that you inject to help with your diabetes. Follow these instructions at home Check your ketones Ketones are chemicals created by the liver. If you have type 1 diabetes, check your urine for ketones every 4 hours. If you have type 2 diabetes, check your urine for ketones as often as told by your health care provider. Eating and drinking Drink enough fluid to keep your urine pale yellow. If you have a fever, vomiting, or diarrhea, you can become dehydrated faster. Do not drink alcohol, caffeine, or drinks with a lot of sugar. Follow instructions from your health care provider about what drinks to avoid. When you are sick, you need to eat some form of carbohydrates (carbs). Eat 45-50 grams (45-50 g) of carbs every 3-4 hours until you feel better. All of the food and drink choices below have about 15 g of carbs in them. Plan ahead and keep some of these on hand so you have them if you get sick. 4-6 oz (120-177 mL) of a carbonated drink that contains sugar. This includes regular soda. Do not drink diet soda. Open the drink and let it sit at room temperature for a few minutes. This may make it easier to drink. 4 oz (120 g) regular gelatin dessert. 4 oz (120 mL) fruit juice. 4 oz (120 g) ice cream or  frozen yogurt. 2 oz (60 g) sherbet. 1 slice bread or toast. 6 saltine crackers. 5 vanilla wafers. Medicines Take over-the-counter and prescription medicines only as told by your health care provider. Check medicine labels for added sugars. Some medicines may contain sugar or types of sugars that can raise your blood glucose level. Contact a health care provider if: You have been sick or have had a fever for 2 days or longer and you are not getting better. Your blood glucose is at or above 240 mg/dL (86.6 mmol/L), even after you take an extra dose of insulin . You vomit every time you drink fluids. For more  than 6 hours, you have: Nausea. Vomiting. Diarrhea. Get help right away if: You have trouble breathing. You have moderate or high levels of ketones in your urine. You have a change in how you think, feel, or act (mental status). You get symptoms of diabetic ketoacidosis. These include: Nausea. Vomiting. Excessive thirst. Excessive urination. Breath that smells fruity or sweet. Rapid breathing. Pain in the abdomen. Your blood glucose is lower than 54 mg/dL (3.0 mmol/L). You used emergency glucagon to treat low blood glucose. These symptoms may be an emergency. Get help right away. Call 911. Do not wait to see if the symptoms will go away. Do not drive yourself to the hospital. This information is not intended to replace advice given to you by your health care provider. Make sure you discuss any questions you have with your health care provider. Document Revised: 05/17/2022 Document Reviewed: 05/17/2022 Elsevier Patient Education  2024 [employer].

## 2024-12-02 ENCOUNTER — Emergency Department (HOSPITAL_COMMUNITY)
Admission: EM | Admit: 2024-12-02 | Discharge: 2024-12-02 | Disposition: A | Discharge status: Home / Self Care | Payer: MEDICAID | Attending: Emergency Medicine | Admitting: Emergency Medicine

## 2024-12-02 ENCOUNTER — Other Ambulatory Visit: Payer: Self-pay

## 2024-12-02 ENCOUNTER — Emergency Department (HOSPITAL_COMMUNITY): Payer: MEDICAID

## 2024-12-02 ENCOUNTER — Encounter (HOSPITAL_COMMUNITY): Payer: Self-pay

## 2024-12-02 DIAGNOSIS — E119 Type 2 diabetes mellitus without complications: Secondary | ICD-10-CM | POA: Insufficient documentation

## 2024-12-02 DIAGNOSIS — Z79899 Other long term (current) drug therapy: Secondary | ICD-10-CM | POA: Insufficient documentation

## 2024-12-02 DIAGNOSIS — Z794 Long term (current) use of insulin: Secondary | ICD-10-CM | POA: Insufficient documentation

## 2024-12-02 DIAGNOSIS — I10 Essential (primary) hypertension: Secondary | ICD-10-CM | POA: Diagnosis not present

## 2024-12-02 DIAGNOSIS — R748 Abnormal levels of other serum enzymes: Secondary | ICD-10-CM | POA: Diagnosis not present

## 2024-12-02 DIAGNOSIS — R059 Cough, unspecified: Secondary | ICD-10-CM | POA: Diagnosis present

## 2024-12-02 DIAGNOSIS — J40 Bronchitis, not specified as acute or chronic: Secondary | ICD-10-CM | POA: Insufficient documentation

## 2024-12-02 LAB — BASIC METABOLIC PANEL WITH GFR
Anion gap: 10 (ref 5–15)
BUN: 19 mg/dL (ref 6–20)
CO2: 19 mmol/L — ABNORMAL LOW (ref 22–32)
Calcium: 8.3 mg/dL — ABNORMAL LOW (ref 8.9–10.3)
Chloride: 103 mmol/L (ref 98–111)
Creatinine, Ser: 1.48 mg/dL — ABNORMAL HIGH (ref 0.44–1.00)
GFR, Estimated: 42 mL/min — ABNORMAL LOW
Glucose, Bld: 264 mg/dL — ABNORMAL HIGH (ref 70–99)
Potassium: 4.8 mmol/L (ref 3.5–5.1)
Sodium: 132 mmol/L — ABNORMAL LOW (ref 135–145)

## 2024-12-02 LAB — CBC WITH DIFFERENTIAL/PLATELET
Abs Immature Granulocytes: 0.03 K/uL (ref 0.00–0.07)
Basophils Absolute: 0 K/uL (ref 0.0–0.1)
Basophils Relative: 0 %
Eosinophils Absolute: 0.3 K/uL (ref 0.0–0.5)
Eosinophils Relative: 4 %
HCT: 30.7 % — ABNORMAL LOW (ref 36.0–46.0)
Hemoglobin: 9.4 g/dL — ABNORMAL LOW (ref 12.0–15.0)
Immature Granulocytes: 0 %
Lymphocytes Relative: 20 %
Lymphs Abs: 1.5 K/uL (ref 0.7–4.0)
MCH: 26.7 pg (ref 26.0–34.0)
MCHC: 30.6 g/dL (ref 30.0–36.0)
MCV: 87.2 fL (ref 80.0–100.0)
Monocytes Absolute: 0.5 K/uL (ref 0.1–1.0)
Monocytes Relative: 6 %
Neutro Abs: 5.2 K/uL (ref 1.7–7.7)
Neutrophils Relative %: 70 %
Platelets: 247 K/uL (ref 150–400)
RBC: 3.52 MIL/uL — ABNORMAL LOW (ref 3.87–5.11)
RDW: 15.6 % — ABNORMAL HIGH (ref 11.5–15.5)
WBC: 7.5 K/uL (ref 4.0–10.5)
nRBC: 0 % (ref 0.0–0.2)

## 2024-12-02 LAB — TROPONIN T, HIGH SENSITIVITY
Troponin T High Sensitivity: 67 ng/L — ABNORMAL HIGH (ref 0–19)
Troponin T High Sensitivity: 58 ng/L — ABNORMAL HIGH (ref 0–19)

## 2024-12-02 LAB — RESP PANEL BY RT-PCR (RSV, FLU A&B, COVID)  RVPGX2
Influenza A by PCR: NEGATIVE
Influenza B by PCR: NEGATIVE
Resp Syncytial Virus by PCR: NEGATIVE
SARS Coronavirus 2 by RT PCR: NEGATIVE

## 2024-12-02 LAB — PRO BRAIN NATRIURETIC PEPTIDE: Pro Brain Natriuretic Peptide: 1968 pg/mL — ABNORMAL HIGH

## 2024-12-02 MED ORDER — DOXYCYCLINE HYCLATE 100 MG PO CAPS: 100.0000 mg | ORAL_CAPSULE | Freq: Two times a day (BID) | ORAL | 0 refills | Status: AC

## 2024-12-02 MED ORDER — GUAIFENESIN ER 1200 MG PO TB12: 1.0000 | ORAL_TABLET | Freq: Two times a day (BID) | ORAL | 0 refills | Status: AC

## 2024-12-02 MED ORDER — BENZONATATE 100 MG PO CAPS: 100.0000 mg | ORAL_CAPSULE | Freq: Three times a day (TID) | ORAL | 0 refills | Status: AC

## 2024-12-02 NOTE — ED Provider Notes (Signed)
 " [place] [facility] Provider Note   CSN: [id number] Arrival date & time: 12/02/24  9364     Patient presents with: Cough   Sarah Long is a 53 y.o. female.    Cough    Patient has a history of fibroids bipolar disorder hypertension diabetes trichomonas acid reflux.  Patient presents to the ED with complaints of cough and congestion.  Patient states she has been bringing up some yellow mucus at times.  She has tried multiple over-the-counter medications without relief.  Patient denies any fevers.  She has had some discomfort in her chest especially with coughing.  She has had some shortness of breath.  Prior to Admission medications  Medication Sig Start Date End Date Taking? Authorizing Provider  benzonatate  (TESSALON ) 100 MG capsule Take 1 capsule (100 mg total) by mouth every 8 (eight) hours. 12/02/24  Yes Randol Simmonds, MD  doxycycline  (VIBRAMYCIN ) 100 MG capsule Take 1 capsule (100 mg total) by mouth 2 (two) times daily. 12/02/24  Yes Randol Simmonds, MD  Guaifenesin  1200 MG TB12 Take 1 tablet (1,200 mg total) by mouth 2 (two) times daily at 10 AM and 5 PM. 12/02/24  Yes Randol Simmonds, MD  ARIPiprazole  (ABILIFY ) 15 MG tablet Take 1 tablet (15 mg total) by mouth daily. 02/21/24   Bahraini, Sarah A  atorvastatin  (LIPITOR ) 40 MG tablet Take 1 tablet (40 mg total) by mouth daily. 06/28/24   Rolan Ezra RAMAN, MD  carvedilol  (COREG ) 12.5 MG tablet TAKE 1 TABLET BY MOUTH 2 (TWO) TIMES DAILY WITH A MEAL (AM+PM) 10/18/24   Newlin, Enobong, MD  Continuous Glucose Sensor (DEXCOM G7 SENSOR) MISC 1 Device by Other route as directed. Every 10 days 10/02/24   Shamleffer, Ibtehal Jaralla, MD  dapagliflozin  propanediol (FARXIGA ) 10 MG TABS tablet Take 1 tablet (10 mg total) by mouth daily before breakfast. 03/12/24   [place], Harlene HERO, FNP  fluconazole  (DIFLUCAN ) 150 MG tablet Take 1 tablet (150 mg total) by mouth daily. 08/07/24   Glena Harlene HERO, FNP  furosemide  (LASIX )  20 MG tablet Take 80 mg by mouth daily.    [provider]  GALAFOLD 123 MG CAPS Take by mouth. 08/03/23   [provider]  hydrALAZINE  (APRESOLINE ) 50 MG tablet Take one three times daily 12/22/23   Brien Belvie BRAVO, MD  hydrOXYzine  (ATARAX ) 25 MG tablet Take 25 mg by mouth daily as needed. 05/11/24   [provider]  Insulin  Disposable Pump (OMNIPOD 5 DEXG7G6 PODS GEN 5) MISC 1 Device by Other route every other day. Change pod every other day 10/02/24   Shamleffer, Ibtehal Jaralla, MD  insulin  glargine (LANTUS  SOLOSTAR) 100 UNIT/ML Solostar Pen Inject 40 Units into the skin daily. Patient not taking: Reported on 10/02/2024 05/29/24   Shamleffer, Ibtehal Jaralla, MD  insulin  lispro (HUMALOG ) 100 UNIT/ML KwikPen Inject 14 Units into the skin 3 (three) times daily. Before meals daily Patient not taking: Reported on 10/02/2024 05/29/24   Shamleffer, Donell Cardinal, MD  Insulin  Pen Needle 31G X 5 MM MISC Use as directed in the morning, at noon, in the evening, and at bedtime. 05/29/24   Shamleffer, Ibtehal Jaralla, MD  isosorbide  dinitrate (ISORDIL ) 20 MG tablet Take 1 tablet (20 mg total) by mouth 2 (two) times daily. 06/28/24   Newlin, Enobong, MD  Misc. Devices MISC Scale.  Diagnosis-CHF 10/11/24   Newlin, Enobong, MD  olopatadine  (PATANOL) 0.1 % ophthalmic solution Place 1 drop into both eyes 2 (two) times  daily. 08/28/24   Vicci Barnie NOVAK, MD  Silver (TEGADERM AG MESH) 4X5 PADS Apply 1 Device topically daily in the afternoon. 05/29/24   Shamleffer, Ibtehal Jaralla, MD  spironolactone  (ALDACTONE ) 25 MG tablet Take 0.5 tablets (12.5 mg total) by mouth daily. 06/26/24   Rolan Ezra RAMAN, MD  terconazole  (TERAZOL 3 ) 0.8 % vaginal cream Place 1 applicator vaginally at bedtime. 09/03/24   Newlin, Enobong, MD  trimethoprim -polymyxin b  (POLYTRIM ) ophthalmic solution Place 1 drop into both eyes every 6 (six) hours. While awake. 08/28/24   Vicci Barnie NOVAK, MD  valsartan  (DIOVAN ) 320  MG tablet Take 1 tablet (320 mg total) by mouth daily. 12/22/23   Brien Belvie BRAVO, MD  varenicline  (CHANTIX  CONTINUING MONTH PAK) 1 MG tablet Take 1 tablet (1 mg total) by mouth 2 (two) times daily. 06/26/24   Rolan Ezra RAMAN, MD  VENTOLIN  HFA 108 (90 Base) MCG/ACT inhaler INHALE 2 PUFFS INTO THE LUNGS EVERY 6 (SIX) HOURS AS NEEDED FOR WHEEZING OR SHORTNESS OF BREATH. 08/16/24   Newlin, Enobong, MD    Allergies: Lamictal  [lamotrigine ], Strawberry extract, and Sulfa antibiotics    Review of Systems  Respiratory:  Positive for cough.     Updated Vital Signs BP (!) 146/79   Pulse 77   Temp 97.9 F (36.6 C)   Resp 18   Ht 1.626 m (5' 4)   Wt 103 kg   SpO2 98%   BMI 38.98 kg/m   Physical Exam Vitals and nursing note reviewed.  Constitutional:      Appearance: She is well-developed. She is not diaphoretic.  HENT:     Head: Normocephalic and atraumatic.     Right Ear: External ear normal.     Left Ear: External ear normal.  Eyes:     General: No scleral icterus.       Right eye: No discharge.        Left eye: No discharge.     Conjunctiva/sclera: Conjunctivae normal.  Neck:     Trachea: No tracheal deviation.  Cardiovascular:     Rate and Rhythm: Normal rate and regular rhythm.  Pulmonary:     Effort: Pulmonary effort is normal. No respiratory distress.     Breath sounds: Normal breath sounds. No stridor. No wheezing or rales.  Abdominal:     General: Bowel sounds are normal. There is no distension.     Palpations: Abdomen is soft.     Tenderness: There is no abdominal tenderness. There is no guarding or rebound.  Musculoskeletal:        General: No tenderness or deformity.     Cervical back: Neck supple.     Right lower leg: No edema.     Left lower leg: No edema.  Skin:    General: Skin is warm and dry.     Findings: No rash.  Neurological:     General: No focal deficit present.     Mental Status: She is alert.     Cranial Nerves: No cranial nerve deficit,  dysarthria or facial asymmetry.     Sensory: No sensory deficit.     Motor: No abnormal muscle tone or seizure activity.     Coordination: Coordination normal.  Psychiatric:        Mood and Affect: Mood normal.     (all labs ordered are listed, but only abnormal results are displayed) Labs Reviewed  BASIC METABOLIC PANEL WITH GFR - Abnormal; Notable for the following components:  Result Value   Sodium 132 (*)    CO2 19 (*)    Glucose, Bld 264 (*)    Creatinine, Ser 1.48 (*)    Calcium  8.3 (*)    GFR, Estimated 42 (*)    All other components within normal limits  CBC WITH DIFFERENTIAL/PLATELET - Abnormal; Notable for the following components:   RBC 3.52 (*)    Hemoglobin 9.4 (*)    HCT 30.7 (*)    RDW 15.6 (*)    All other components within normal limits  PRO BRAIN NATRIURETIC PEPTIDE - Abnormal; Notable for the following components:   Pro Brain Natriuretic Peptide 1,968.0 (*)    All other components within normal limits  TROPONIN T, HIGH SENSITIVITY - Abnormal; Notable for the following components:   Troponin T High Sensitivity 67 (*)    All other components within normal limits  RESP PANEL BY RT-PCR (RSV, FLU A&B, COVID)  RVPGX2  TROPONIN T, HIGH SENSITIVITY    EKG: EKG Interpretation Date/Time:  Sunday December 02 2024 07:45:26 EST Ventricular Rate:  88 PR Interval:  186 QRS Duration:  154 QT Interval:  432 QTC Calculation: 522 R Axis:   201  Text Interpretation: Atrial-sensed ventricular-paced rhythm Abnormal ECG When compared with ECG of 26-Jun-2024 15:29, No significant change since last tracing Confirmed by Randol Simmonds [phone]) on 12/02/2024 2:34:05 PM  Radiology: DG Chest 2 View Result Date: 12/02/2024 CLINICAL DATA:  Productive cough for 10 days. Shortness of breath. Right-sided chest pain. EXAM: CHEST - 2 VIEW COMPARISON:  01/06/2024 FINDINGS: Stable borderline cardiomegaly. Dual lead pacemaker remains in appropriate position. Stable scarring seen in left  midlung. The lungs are otherwise clear. The visualized skeletal structures are unremarkable. IMPRESSION: Stable borderline cardiomegaly and left lung scarring. No acute findings. Electronically Signed   By: Norleen DELENA Kil M.D.   On: 12/02/2024 07:58     Procedures   Medications Ordered in the ED - No data to display  Clinical Course as of 12/02/24 1555  Sun Dec 02, 2024  1449 CBC with Differential(!) Hemoglobin is stable compared to previous. [JK]  1450 Basic metabolic panel(!) Renal function improved compared to previous [JK]  1450 Pro Brain natriuretic peptide(!) proBNP elevated [JK]  1450 Troponin T, High Sensitivity(!) Troponin increased.  Will recheck delta troponin [JK]  1450 Chest x-ray without signs of pneumonia [JK]    Clinical Course User Index [JK] Randol Simmonds, MD                                 Medical Decision Making Amount and/or Complexity of Data Reviewed Labs: ordered. Decision-making details documented in ED Course. Radiology: ordered.  Risk OTC drugs. Prescription drug management.   Patient presents with complaints of cough congestion.  Patient was describes having some chest discomfort intermittent shortness of breath.  Low suspicion for ACS CHF pulmonary embolism.  Patient symptoms more suggestive of respiratory infection.  Patient's laboratory tests are similar to previous values.  Chest x-ray does not show pneumonia.  BNP is elevated but suspect this is more chronic in nature she does not have any evidence of pulmonary edema no signs of fluid overload on exam.  Initial troponin slightly elevated.  Likely related to her chronic kidney disease but will repeat to make sure it is stable.      Final diagnoses:  Bronchitis    ED Discharge Orders  Ordered    benzonatate  (TESSALON ) 100 MG capsule  Every 8 hours        12/02/24 1451    Guaifenesin  1200 MG TB12  2 times daily        12/02/24 1451    doxycycline  (VIBRAMYCIN ) 100 MG capsule  2 times  daily        12/02/24 1555               Randol Simmonds, MD 12/02/24 1555  "

## 2024-12-02 NOTE — ED Provider Triage Note (Addendum)
 Emergency Medicine Provider Triage Evaluation Note  Sarah Long , a 53 y.o. female  was evaluated in triage.  Pt complains of a productive cough for 10 days. Also endorses SOB with exertion and right sided chest pain. Pacemaker in place  Review of Systems  Positive: SOB, productive cough, CP Negative: Fevers, congestion, N/V/D, abdominal pain   Physical Exam  BP (!) 161/75 (BP Location: Right Arm)   Pulse 86   Temp (!) 97.4 F (36.3 C)   Resp 16   Ht 5' 4 (1.626 m)   Wt 103 kg   SpO2 100%   BMI 38.98 kg/m  Gen:   Awake, no distress   Resp:  Normal effort  MSK:   Moves extremities without difficulty  Other:  Lungs clear   Medical Decision Making  Medically screening exam initiated at 7:15 AM.  Appropriate orders placed.  Sarah Long was informed that the remainder of the evaluation will be completed by another provider, this initial triage assessment does not replace that evaluation, and the importance of remaining in the ED until their evaluation is complete.  Labs, chest x-ray, EKG, troponin, RVP   Braxton Dubois, PA-C 12/02/24 0719    Braxton Dubois, PA-C 12/02/24 [phone]

## 2024-12-02 NOTE — ED Notes (Signed)
 Pt asking about wait time, pt updated, pt states she wants to go home due to wait time and she is hungry. Pt given sandwich bag and diet ginger ale

## 2024-12-02 NOTE — Discharge Instructions (Addendum)
 Take the medications as prescribed to help with your cough and congestion.  Follow-up with your primary care doctor to be rechecked.  Return to the ER for shortness of breath fevers or other concerning symptoms

## 2024-12-02 NOTE — ED Provider Notes (Signed)
" ° ° °  ED Course / MDM   Clinical Course as of 12/02/24 1721  Sun Dec 02, 2024  1449 CBC with Differential(!) Hemoglobin is stable compared to previous. [JK]  1450 Basic metabolic panel(!) Renal function improved compared to previous [JK]  1450 Pro Brain natriuretic peptide(!) proBNP elevated [JK]  1450 Troponin T, High Sensitivity(!) Troponin increased.  Will recheck delta troponin [JK]  1450 Chest x-ray without signs of pneumonia [JK]  1721 Repeat troponin is downtrending. Feel patient stable for discharge. Will discharge patient to home. All questions answered. Patient comfortable with plan of discharge. Return precautions discussed with patient and specified on the after visit summary.  [WS]    Clinical Course User Index [JK] Randol Simmonds, MD [WS] Francesca Elsie CROME, MD   Medical Decision Making Amount and/or Complexity of Data Reviewed Labs: ordered. Decision-making details documented in ED Course. Radiology: ordered.  Risk OTC drugs. Prescription drug management.         Francesca Elsie CROME, MD 12/02/24 1721  "

## 2024-12-02 NOTE — ED Triage Notes (Signed)
 Cough x 10 days that is not getting better and can't get anything up.  Reports taking OTC meds with no relief.

## 2024-12-03 ENCOUNTER — Other Ambulatory Visit: Payer: Self-pay | Admitting: Family Medicine

## 2024-12-03 ENCOUNTER — Other Ambulatory Visit: Payer: Self-pay | Admitting: Internal Medicine

## 2024-12-03 ENCOUNTER — Other Ambulatory Visit: Payer: Self-pay | Admitting: Critical Care Medicine

## 2024-12-21 ENCOUNTER — Telehealth: Payer: Self-pay

## 2024-12-21 ENCOUNTER — Encounter: Payer: Self-pay | Admitting: Family

## 2024-12-21 ENCOUNTER — Ambulatory Visit: Payer: MEDICAID | Admitting: Family

## 2024-12-21 DIAGNOSIS — Z89512 Acquired absence of left leg below knee: Secondary | ICD-10-CM | POA: Diagnosis not present

## 2024-12-21 NOTE — Patient Instructions (Signed)
 Sarah Long Notice - I am sorry I was unable to reach you today for our scheduled appointment. I work with Newlin, Enobong, MD and am calling to support your healthcare needs. Please contact me at [phone] at your earliest convenience. I look forward to speaking with you soon.   Thank you,  [name], RN MSN [place]  [facility] RN Care Manager Direct Dial : [phone]  Fax: [phone]

## 2024-12-21 NOTE — Patient Outreach (Signed)
 Care Coordination   12/21/2024 Name: Sarah Long MRN: [id number] DOB: [birth year]   Care Coordination Outreach Attempts:  An unsuccessful outreach was attempted for an appointment today.  Follow Up Plan:  Additional outreach attempts will be made to complete CCM follow-up visit.   Encounter Outcome:  No Answer. HIPAA compliant voicemail left requesting return call.   [name], RN MSN [place]  VBCI Population Health RN Care Manager Direct Dial : [phone]  Fax: [phone]

## 2024-12-21 NOTE — Progress Notes (Signed)
 "  Office Visit Note   [name]           Date of Birth: Jul 29, 1971           MRN: [id number] Visit Date: 12/21/2024              Requested by: Delbert Clam, MD [address] [place] [address] [place],  [place] [address] PCP: Delbert Clam, MD  Chief Complaint  Patient presents with   Left Leg - Follow-up    Left BKA, prosthetic Rx needed      HPI: The patient is a 54 year old woman who presents for evaluation of her left residual limb her current prosthesis is worn out broken down.  She is due for a new prosthesis.  She has had volume loss of her residual limb and currently fits poorly and her prosthesis socket.  She has done quite well ambulates without an ambulatory aid in her current prosthesis.  She has not been seen for several years as she has been doing well  Patient is an existing left transtibial  amputee.  Patient's current comorbidities are not expected to impact the ability to function with the prescribed prosthesis. Patient verbally communicates a strong desire to use a prosthesis. Patient currently requires mobility aids to ambulate without a prosthesis.  Expects not to use mobility aids with a new prosthesis.  Patient is a K3 level ambulator that spends a lot of time walking around on uneven terrain over obstacles, up and down stairs, and ambulates with a variable cadence.     Assessment & Plan: Visit Diagnoses: No diagnosis found.  Plan: Given an order for prosthesis set up she will follow-up with [facility] follow-up as needed in our office  Follow-Up Instructions: No follow-ups on file.   Ortho Exam  Patient is alert, oriented, no adenopathy, well-dressed, normal affect, normal respiratory effort. On examination left residual limb this is well consolidated well-healed there is no impending ulceration    Imaging: No results found. No images are attached to the encounter.  Labs: Lab Results  Component Value Date   HGBA1C 12.9 (A)  10/02/2024   HGBA1C 10.0 (A) 05/23/2024   HGBA1C 10.2 (A) 11/25/2023   LABURIC 4.1 10/21/2016   LABURIC 4.2 11/20/2010   REPTSTATUS 04/14/2024 FINAL 04/13/2024   GRAMSTAIN NO WBC SEEN NO ORGANISMS SEEN  08/19/2021   CULT MULTIPLE SPECIES PRESENT, SUGGEST RECOLLECTION (A) 04/13/2024   LABORGA ESCHERICHIA COLI (A) 12/15/2021     Lab Results  Component Value Date   ALBUMIN 3.1 (L) 08/29/2024   ALBUMIN 3.6 (L) 12/22/2023   ALBUMIN 3.4 (L) 04/05/2023    Lab Results  Component Value Date   MG 1.7 06/28/2021   MG 1.8 06/27/2021   MG 1.7 06/27/2021   Lab Results  Component Value Date   VD25OH 24.1 (L) 03/11/2020   VD25OH <4.20 (L) 02/15/2020    No results found for: PREALBUMIN    Latest Ref Rng & Units 12/02/2024    7:07 AM 02/21/2024    2:33 PM 01/06/2024    9:37 PM  CBC EXTENDED  WBC 4.0 - 10.5 K/uL 7.5  12.1  7.6   RBC 3.87 - 5.11 MIL/uL 3.52  5.41  2.94   Hemoglobin 12.0 - 15.0 g/dL 9.4  85.9  7.9   HCT 63.9 - 46.0 % 30.7  45.4  26.1   Platelets 150 - 400 K/uL 247  242  216   NEUT# 1.7 - 7.7 K/uL 5.2  8.7  Lymph# 0.7 - 4.0 K/uL 1.5  2.2       There is no height or weight on file to calculate BMI.  Orders:  No orders of the defined types were placed in this encounter.  No orders of the defined types were placed in this encounter.    Procedures: No procedures performed  Clinical Data: No additional findings.  ROS:  All other systems negative, except as noted in the HPI. Review of Systems  Objective: Vital Signs: There were no vitals taken for this visit.  Specialty Comments:  No specialty comments available.  PMFS History: Patient Active Problem List   Diagnosis Date Noted   Type 2 diabetes mellitus with stage 3a chronic kidney disease, with long-term current use of insulin  (HCC) 05/29/2024   Menorrhagia with regular cycle 02/21/2024   Presence of heart assist device (HCC) 12/22/2023   Skin rash 06/28/2023   PTSD (post-traumatic stress  disorder) 05/13/2023   Cardiac amyloidosis (HCC) 04/27/2023   Type 2 diabetes mellitus with microalbuminuria, with long-term current use of insulin  (HCC) 12/15/2022   Peripheral vascular disease, unspecified 03/22/2022   Type 2 diabetes mellitus with diabetic polyneuropathy, with long-term current use of insulin  (HCC) 02/03/2022   Type 2 diabetes mellitus with hyperglycemia, with long-term current use of insulin  (HCC) 02/03/2022   Microalbuminuria 02/03/2022   Right great toe amputee 11/03/2021   Class 2 severe obesity due to excess calories with serious comorbidity and body mass index (BMI) of 36.0 to 36.9 in adult 10/15/2021   Insomnia 10/07/2021   Left below-knee amputee (HCC) 07/01/2021   Normocytic anemia 06/26/2021   Heart failure with mid-range ejection fraction (HCC) 06/26/2021   Diabetic nephropathy with proteinuria (HCC) 06/26/2021   Vaginal discharge 07/16/2020   Dental caries 04/01/2020   Cracked tooth 04/01/2020   Vitamin D  deficiency 03/11/2020   Prolonged QT interval 02/26/2020   Chronic combined systolic and diastolic CHF (congestive heart failure) (HCC) 02/26/2020   Heart block AV complete (HCC) 02/11/2020   Exocrine pancreatic insufficiency    Dysmenorrhea 10/02/2019   DM type 2 with diabetic peripheral neuropathy (HCC) 05/30/2019   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 block) 05/29/2019   Former smoker 11/26/2013   Hypercholesteremia 02/02/2007   Bipolar 1 disorder, depressed (HCC) 02/02/2007   HYPERTENSION, BENIGN SYSTEMIC 02/02/2007   Past Medical History:  Diagnosis Date   Abnormal Pap smear 1998   Abnormal vaginal bleeding 12/19/2011   Acute urinary retention 06/26/2021   Arthritis    Bacterial infection    Bipolar 1 disorder (HCC)    Blister of second toe of left foot 11/21/2016   Candida vaginitis 07/2007   Depression    recently added wellbutrin-has not taken yet for bipolar   Diabetes in pregnancy    Diabetes mellitus     nph 20U qam and qpm, regular with meals   Diabetic ketoacidosis without coma associated with type 2 diabetes mellitus (HCC)    Fibroid    Galactorrhea of right breast 2008   GERD (gastroesophageal reflux disease)    H/O amenorrhea 07/2007   H/O dizziness 10/14/2011   H/O dysmenorrhea 2010   H/O menorrhagia 10/14/2011   H/O varicella    Headache(784.0)    Heavy vaginal bleeding due to contraceptive injection use 10/12/2011   Depo Provera    Herpes    Homelessness 11/19/2021   HSV-2 infection 01/03/2009   Hx: UTI (urinary tract infection) 2009   Hypertension    on aldomet   Hypoalbuminemia due  to protein-calorie malnutrition 06/26/2021   Increased BMI 2010   Irregular uterine bleeding 04/04/2012   Pt has mirena    Obesity 10/14/2011   Oligomenorrhea 07/2007   Osteomyelitis of great toe of right foot (HCC) 03/22/2022   Pelvic pain in female    Presence of permanent cardiac pacemaker    Preterm labor    PTSD (post-traumatic stress disorder)    Trichomonas    Yeast infection     Family History  Problem Relation Age of Onset   Hypertension Mother    Diabetes Mother    Heart disease Mother    Hypertension Father    Diabetes Father    Heart disease Father    Depression Maternal Grandmother    Suicidality Maternal Grandmother    Stroke Maternal Grandfather    Schizophrenia Paternal Grandmother    Other Neg Hx    Breast cancer Neg Hx     Past Surgical History:  Procedure Laterality Date   ABDOMINAL AORTOGRAM W/LOWER EXTREMITY N/A 06/17/2021   Procedure: ABDOMINAL AORTOGRAM W/LOWER EXTREMITY;  Surgeon: Darron Deatrice LABOR, MD;  Location: [facility];  Service: Cardiovascular;  Laterality: N/A;   AMPUTATION Left 06/27/2021   Procedure: AMPUTATION BELOW KNEE;  Surgeon: Harden Jerona GAILS, MD;  Location: [facility] OR;  Service: Orthopedics;  Laterality: Left;   AMPUTATION Right 09/04/2021   Procedure: RIGHT GREAT TOE AMPUTATION;  Surgeon: Harden Jerona GAILS, MD;  Location: [facility] OR;  Service:  Orthopedics;  Laterality: Right;   CESAREAN SECTION  1991   LEFT HEART CATH AND CORONARY ANGIOGRAPHY N/A 02/10/2020   Procedure: LEFT HEART CATH AND CORONARY ANGIOGRAPHY;  Surgeon: Burnard Debby LABOR, MD;  Location: [facility];  Service: Cardiovascular;  Laterality: N/A;   PACEMAKER IMPLANT N/A 02/13/2020   Procedure: PACEMAKER IMPLANT;  Surgeon: Inocencio Soyla Lunger, MD;  Location: [facility];  Service: Cardiovascular;  Laterality: N/A;   TEMPORARY PACEMAKER N/A 02/10/2020   Procedure: TEMPORARY PACEMAKER;  Surgeon: Burnard Debby LABOR, MD;  Location: [facility] INVASIVE CV LAB;  Service: Cardiovascular;  Laterality: N/A;   Social History   Occupational History   Not on file  Tobacco Use   Smoking status: Former    Current packs/day: 0.00    Average packs/day: 0.5 packs/day for 20.0 years (10.0 ttl pk-yrs)    Types: Cigarettes    Start date: 07/01/2001    Quit date: 07/01/2021    Years since quitting: 3.4   Smokeless tobacco: Never   Tobacco comments:    1. Centrix to stop smoking   Vaping Use   Vaping status: Never Used  Substance and Sexual Activity   Alcohol use: Not Currently    Comment: denies recently; history of excessive use   Drug use: No   Sexual activity: Yes    Partners: Female, Female    Birth control/protection: None       "

## 2024-12-26 ENCOUNTER — Other Ambulatory Visit: Payer: Self-pay

## 2024-12-26 ENCOUNTER — Ambulatory Visit (HOSPITAL_BASED_OUTPATIENT_CLINIC_OR_DEPARTMENT_OTHER): Payer: MEDICAID | Attending: Cardiology | Admitting: Cardiology

## 2024-12-26 VITALS — Ht 64.0 in | Wt 240.0 lb

## 2024-12-26 DIAGNOSIS — I5042 Chronic combined systolic (congestive) and diastolic (congestive) heart failure: Secondary | ICD-10-CM | POA: Insufficient documentation

## 2024-12-26 DIAGNOSIS — G4733 Obstructive sleep apnea (adult) (pediatric): Secondary | ICD-10-CM | POA: Insufficient documentation

## 2024-12-26 NOTE — Patient Outreach (Signed)
 Complex Care Management   Visit Note  12/26/2024  Name:  Sarah Long MRN: [id number] DOB: 1971-04-26  Situation: Referral received for Complex Care Management related to Heart Failure and Diabetes with Complications I obtained verbal consent from Patient.  Visit completed with Patient  on the phone  Background:   Past Medical History:  Diagnosis Date   Abnormal Pap smear 1998   Abnormal vaginal bleeding 12/19/2011   Acute urinary retention 06/26/2021   Arthritis    Bacterial infection    Bipolar 1 disorder (HCC)    Blister of second toe of left foot 11/21/2016   Candida vaginitis 07/2007   Depression    recently added wellbutrin-has not taken yet for bipolar   Diabetes in pregnancy    Diabetes mellitus    nph 20U qam and qpm, regular with meals   Diabetic ketoacidosis without coma associated with type 2 diabetes mellitus (HCC)    Fibroid    Galactorrhea of right breast 2008   GERD (gastroesophageal reflux disease)    H/O amenorrhea 07/2007   H/O dizziness 10/14/2011   H/O dysmenorrhea 2010   H/O menorrhagia 10/14/2011   H/O varicella    Headache(784.0)    Heavy vaginal bleeding due to contraceptive injection use 10/12/2011   Depo Provera    Herpes    Homelessness 11/19/2021   HSV-2 infection 01/03/2009   Hx: UTI (urinary tract infection) 2009   Hypertension    on aldomet   Hypoalbuminemia due to protein-calorie malnutrition 06/26/2021   Increased BMI 2010   Irregular uterine bleeding 04/04/2012   Pt has mirena    Obesity 10/14/2011   Oligomenorrhea 07/2007   Osteomyelitis of great toe of right foot (HCC) 03/22/2022   Pelvic pain in female    Presence of permanent cardiac pacemaker    Preterm labor    PTSD (post-traumatic stress disorder)    Trichomonas    Yeast infection     Assessment: Patient Reported Symptoms:  Cognitive Cognitive Status: Able to follow simple commands, Alert and oriented to person, place, and time, Normal speech and language  skills Cognitive/Intellectual Conditions Management [RPT]: None reported or documented in medical history or problem list   Health Maintenance Behaviors: Annual physical exam, Healthy diet Health Facilitated by: Healthy diet, Rest  Neurological Neurological Review of Symptoms: No symptoms reported Neurological Management Strategies: Routine screening  HEENT HEENT Symptoms Reported: No symptoms reported HEENT Management Strategies: Routine screening    Cardiovascular Cardiovascular Symptoms Reported: Swelling in legs or feet Does patient have uncontrolled Hypertension?: No Is patient checking Blood Pressure at home?: Yes Cardiovascular Management Strategies: Medication therapy, Routine screening Do You Have a Working Readable Scale?: Yes Weight: 240 lb (108.9 kg) (Patient reported) Cardiovascular Comment: Patient reports she has not been weighing daily, but has been checking frequently during son's MD appointments. She reports she has swelling in her hands and right leg  Respiratory Respiratory Symptoms Reported: Productive cough Additional Respiratory Details: Note per chart review patient had recent ED visit for cough and congestion. Patient reports cough is much improved with medication. She reports she continues to cough up brown colored mucus occasionally. She denies chest tightness or wheezing. She reports she has not needed PRN inhaler recently Respiratory Management Strategies: Medication therapy, Routine screening  Endocrine Endocrine Symptoms Reported: No symptoms reported Is patient diabetic?: Yes Is patient checking blood sugars at home?: Yes List most recent blood sugar readings, include date and time of day: Dexcom G7, under 200 per patient Endocrine Comment: Reminded patient  of upcoming endocrinology appointment 01/02/25. 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 She reports she has an upcoming appointment with 03/12/25 with provider at [facility] Gastrointestinal Management Strategies: Diet modification, Medication therapy    Genitourinary Genitourinary Symptoms Reported: Not assessed    Integumentary Integumentary Symptoms Reported: Not assessed    Musculoskeletal Musculoskelatal Symptoms Reviewed: Unsteady gait Musculoskeletal Management Strategies: Medical device, Adequate rest (Roller, prosthesis) Musculoskeletal Comment: Patient denies falls since previous CMRN visit. Patient reports regular exercise through [place] in the past year?: Yes Number of falls in past year: 2 or more Was there an injury with Fall?: No Fall Risk Category Calculator: 2 Patient Fall Risk Level: Moderate Fall Risk Patient at Risk for Falls Due to: History of fall(s), Orthopedic patient Fall risk Follow up: Falls evaluation completed, Education provided, Falls prevention discussed  Psychosocial Psychosocial Symptoms Reported: Depression - if selected complete PHQ 2-9 Additional Psychological Details: Patient reports she continues to visit with counselor every 2-3 weeks Behavioral Management Strategies: Coping strategies, Counseling Major Change/Loss/Stressor/Fears (CP): Death of a loved one Techniques to Cope with Loss/Stress/Change: Counseling, Diversional activities Quality of Family Relationships: helpful, involved, supportive Do you feel physically threatened by others?: No    12/26/2024    PHQ2-9 Depression Screening   Little interest or pleasure in doing things Not at all  Feeling down, depressed, or hopeless Several days  PHQ-2 - Total Score 1  Trouble falling or staying asleep, or sleeping too much    Feeling tired or having little energy    Poor appetite or overeating     Feeling bad about yourself - or that you are a failure or have let yourself or your family down     Trouble concentrating on things, such as reading the newspaper or watching television    Moving or speaking so slowly that other people could have noticed.  Or the opposite - being so fidgety or restless that you have been moving around a lot more than usual    Thoughts that you would be better off dead, or hurting yourself in some way    PHQ2-9 Total Score    If you checked off any problems, how difficult have these problems made it for you to do your work, take care of things at home, or get along with other people    Depression Interventions/Treatment      Today's Vitals   12/26/24 1017  Weight: 240 lb (108.9 kg)   Pain Scale: 0-10 Pain Score: 0-No pain  Medications Reviewed Today     Reviewed by Arno Rosaline SQUIBB, RN (Registered Nurse) on 12/26/24 at 1015  Med List Status: <None>   Medication Order Taking? Sig Documenting Provider Last Dose Status Informant  ARIPiprazole  (ABILIFY ) 15 MG tablet [id number]  Take 1 tablet (15 mg total) by mouth daily. Mercy Lauraine LABOR  Active   atorvastatin  (LIPITOR ) 40 MG tablet [id number]  Take 1 tablet (40 mg total) by mouth daily. Rolan Ezra RAMAN, MD  Active   benzonatate  (TESSALON ) 100 MG capsule [id number]  Take 1 capsule (100 mg total) by mouth every 8 (eight) hours. Randol Simmonds, MD  Active   carvedilol  (COREG ) 12.5 MG tablet [id number]  TAKE 1 TABLET BY MOUTH 2 (TWO) TIMES DAILY WITH A MEAL (AM+PM) Newlin, Enobong, MD  Active   Continuous Glucose Sensor (DEXCOM G7 SENSOR) MISC [id number]  1  Device by Other route as directed. Every 10 days Shamleffer, Ibtehal Jaralla, MD  Active   dapagliflozin  propanediol (FARXIGA ) 10 MG TABS tablet [id number]  Take 1 tablet (10 mg total) by mouth daily before breakfast. Glena Harlene HERO, FNP  Active   doxycycline  (VIBRAMYCIN ) 100 MG capsule [id number]  Take 1 capsule (100 mg total) by mouth 2 (two) times daily. Randol Simmonds, MD  Active   EASY COMFORT PEN NEEDLES 31G X 5 MM MISC [id number]  USE AS DIRECTED IN THE  MORNING, AT NOON, IN THE EVENING, AND AT BEDTIME. Shamleffer, Ibtehal Jaralla, MD  Active   fluconazole  (DIFLUCAN ) 150 MG tablet [id number]  Take 1 tablet (150 mg total) by mouth daily. Burton, [place], [place]  Active   furosemide  (LASIX ) 20 MG tablet [id number]  Take 80 mg by mouth daily. [provider]  Active   GALAFOLD 123 MG CAPS [id number]  Take by mouth. [provider]  Active   Guaifenesin  1200 MG TB12 [id number]  Take 1 tablet (1,200 mg total) by mouth 2 (two) times daily at 10 AM and 5 PM. Randol Simmonds, MD  Active   hydrALAZINE  (APRESOLINE ) 50 MG tablet [id number]  TAKE ONE TABLET BY MOUTH THREE TIMES A DAY. (AM+NOON+PM) Newlin, Enobong, MD  Active   hydrOXYzine  (ATARAX ) 25 MG tablet [id number]  Take 25 mg by mouth daily as needed. [provider]  Active   Insulin  Disposable Pump (OMNIPOD 5 DEXG7G6 PODS GEN 5) MISC [id number]  1 Device by Other route every other day. Change pod every other day Shamleffer, Donell Cardinal, MD  Active   insulin  glargine (LANTUS  SOLOSTAR) 100 UNIT/ML Solostar Pen [id number]  Inject 40 Units into the skin daily.  Patient not taking: Reported on 10/02/2024   Shamleffer, Ibtehal Jaralla, MD  Active   insulin  lispro (HUMALOG ) 100 UNIT/ML KwikPen [id number]  Inject 14 Units into the skin 3 (three) times daily. Before meals daily  Patient not taking: Reported on 10/02/2024   Shamleffer, Ibtehal Jaralla, MD  Active   isosorbide  dinitrate (ISORDIL ) 20 MG tablet [id number]  TAKE 1 TABLET (20 MG TOTAL) BY MOUTH 2 (TWO) TIMES DAILY (AM+PM) Newlin, Enobong, MD  Active   Misc. Devices MISC [id number]  Scale.  Diagnosis-CHF Newlin, Enobong, MD  Active   olopatadine  (PATANOL) 0.1 % ophthalmic solution [id number]  Place 1 drop into both eyes 2 (two) times daily. Vicci Barnie NOVAK, MD  Active   [facility] AG MESH) 4X5 PADS [id number]  Apply 1 Device topically daily in the afternoon. Shamleffer, Ibtehal Jaralla, MD  Active   spironolactone  (ALDACTONE )  25 MG tablet [id number]  Take 0.5 tablets (12.5 mg total) by mouth daily. Rolan Ezra RAMAN, MD  Active   terconazole  (TERAZOL 3 ) 0.8 % vaginal cream [id number]  Place 1 applicator vaginally at bedtime. Newlin, Enobong, MD  Active   trimethoprim -polymyxin b  (POLYTRIM ) ophthalmic solution [id number]  Place 1 drop into both eyes every 6 (six) hours. While awake. Vicci Barnie NOVAK, MD  Active   valsartan  (DIOVAN ) 320 MG tablet [id number]  TAKE 1 TABLET (320 MG TOTAL) BY MOUTH DAILY (AM) Newlin, Enobong, MD  Active   varenicline  (CHANTIX  CONTINUING MONTH PAK) 1 MG tablet [id number]  Take 1 tablet (1 mg total) by mouth 2 (two) times daily. Rolan Ezra RAMAN, MD  Active   VENTOLIN  HFA 108 [phone] Base) MCG/ACT inhaler [id number]  INHALE 2 PUFFS INTO THE LUNGS EVERY 6 (SIX) HOURS AS NEEDED FOR WHEEZING OR SHORTNESS OF BREATH. Newlin, Enobong, MD  Active  Recommendation:   Continue Current Plan of Care Schedule PFTs  Follow Up Plan:   Telephone follow up appointment date/time:  01/23/25 at 10:30 AM  [name], RN MSN [place]  [facility] RN Care Manager Direct Dial : [phone]  Fax: [phone]

## 2024-12-26 NOTE — Patient Instructions (Signed)
 Visit Information  Sarah Long was given information about Medicaid Managed Care team care coordination services as a part of their [employer] benefit.   If you would like to schedule transportation through your [facility] plan, please call the following number at least 2 days in advance of your appointment: [phone]  If you are experiencing a behavioral health crisis, call the [employer] [place]  Behavioral Health Crisis Line at [phone]). The line is available 24 hours a day, seven days a week.   Care plan and visit instructions communicated with the patient verbally today. Patient agrees to receive a copy in MyChart. Active MyChart status and patient understanding of how to access instructions and care plan via MyChart confirmed with patient.     Telephone follow up appointment with Managed Medicaid care management team member scheduled for: 01/23/25 at 10:30 AM  [name], RN MSN [place]  VBCI Population Health RN Care Manager Direct Dial : [phone]  Fax: [phone]   Following is a copy of your plan of care:   Goals Addressed             This Visit's Progress    VBCI RN Care Plan   On track    Problems:  Chronic Disease Management support and education needs related to CHF and DMII  Goal: Over the next 30 days the Patient will demonstrate Ongoing adherence to prescribed treatment plan for DMII as evidenced by patient report of fasting blood sugar readings of 80-130 Demonstrate improved adherence to prescribed treatment plan for CHF as evidenced by patient report of weighing daily, no symptoms related to fluid retention Schedule PFTs as evidenced by chart review or patient report  Interventions:   Diabetes Interventions: Assessed patient's understanding of A1c goal: <7% Provided education to patient about basic DM disease process Reviewed medications with patient and discussed  importance of medication adherence Counseled on importance of regular laboratory monitoring as prescribed Discussed plans with patient for ongoing care management follow up and provided patient with direct contact information for care management team Provided patient with written educational materials related to hypo and hyperglycemia and importance of correct treatment Advised patient, providing education and rationale, to check cbg as advised by provider and record, calling endocrinology or PCP for findings outside established parameters Reminded patient of upcoming appointment with endocrinology Reviewed goal A1c of <7% and patient's most recent A1c Lab Results  Component Value Date   HGBA1C 12.9 (A) 10/02/2024    Evaluation of current treatment plan related to chronic illness,  self-management and patient's adherence to plan as established by provider. Discussed plans with patient for ongoing care management follow up and provided patient with direct contact information for care management team Reminded patient of upcoming sleep study scheduled 12/27/23. Ensured patient understands instructions 3-way call placed to [facility] in an attempt to schedule PFTs. No answer. Voicemail left requesting they call patient to schedule. Ensured patient has phone number if she does not hear back   Heart Failure Interventions: Basic overview and discussion of pathophysiology of Heart Failure reviewed Reviewed Heart Failure Action Plan in depth and provided written copy Assessed need for readable accurate scales in home Provided education about placing scale on hard, flat surface Advised patient to weigh each morning after emptying bladder Discussed importance of daily weight and advised patient to weigh and record daily Reviewed role of diuretics in prevention of fluid overload and management of heart failure; Screening for signs and symptoms  of depression related to chronic disease  state  Assessed social determinant of health barriers    Patient Self-Care Activities:  Attend all scheduled provider appointments Call pharmacy for medication refills 3-7 days in advance of running out of medications Call provider office for new concerns or questions  Take medications as prescribed   call office if I gain more than 2 pounds in one day or 5 pounds in one week track weight in diary use salt in moderation watch for swelling in feet, ankles and legs every day weigh myself daily check blood sugar at prescribed times: as advised by provider check feet daily for cuts, sores or redness enter blood sugar readings and medication or insulin  into daily log take the blood sugar log to all doctor visits drink 6 to 8 glasses of water  each day Schedule PFTs  Plan:  Telephone follow up appointment with care management team member scheduled for:  01/23/25 at 10:30 AM

## 2024-12-31 NOTE — Procedures (Addendum)
 " Indications for Polysomnography The patient is a 54 year old Female who is 5' 4 and weighs 240.0 lbs. Her BMI equals 41.5.  A full night polysomnogram was performed to evaluate for Obstructive Sleep Apnea.  No medications were reported taken during the night.No Data. Polysomnogram Data A full night polysomnogram recorded the standard physiologic parameters including EEG, EOG, EMG, EKG, nasal and oral airflow.  Respiratory parameters of chest and abdominal movements were recorded with Respiratory Inductance Plethysmography belts.   Oxygen saturation was recorded by pulse oximetry.  Sleep Architecture The total recording time of the polysomnogram was 361.1 minutes.  The total sleep time was 307.0 minutes.  The patient spent 11.4% of total sleep time in Stage N1, 81.1% in Stage N2, 1.0% in Stages N3, and 6.5% in REM.  Sleep latency was 13.6 minutes.   REM latency was 206.5 minutes.  Sleep Efficiency was 85.0%.  Wake after Sleep Onset time was 40.5 minutes.  Respiratory Events The polysomnogram revealed a presence of 1 obstructive, 0 central, and 0 mixed apneas resulting in an Apnea index of 0.2 events per hour.  There were 101 hypopneas (GreaterEqual to3% desaturation and/or arousal) resulting in an Apnea\Hypopnea Index (AHI  GreaterEqual to3% desaturation and/or arousal) of 19.9 events per hour.  There were 37 hypopneas (GreaterEqual to4% desaturation) resulting in an Apnea\Hypopnea Index (AHI GreaterEqual to4% desaturation) of 7.4 events per hour.  There were 105  Respiratory Effort Related Arousals resulting in a RERA index of 20.5 events per hour. The Respiratory Disturbance Index is 40.5 events per hour.  The snore index was 0 events per hour.  Mean oxygen saturation was 97.1%.  The lowest oxygen saturation during sleep was 76.0%.  Time spent LessEqual to88% oxygen saturation was  minutes ().  Limb Activity There were 4 total limb movements recorded, of this total, 4 were classified as PLMs.   PLM index was 0.8 per hour and PLM associated with Arousals index was 0.2 per hour.  Cardiac Summary The average pulse rate was 97.4 bpm.  The minimum pulse rate was 80.0 bpm while the maximum pulse rate was 110.0 bpm.  Cardiac rhythm was NSR with V paced rhythm.  Diagnosis: Mild Obstructive Sleep Apnea  Recommendations: 1.  Clinical correlation of these findings is necessary.  The decision to treat obstructive sleep apnea (OSA) is usually based on the presence of apnea symptoms or the presence of associated medical conditions such as Hypertension, Congestive Heart  Failure, Atrial Fibrillation or Obesity.  The most common symptoms of OSA are snoring, gasping for breath while sleeping, daytime sleepiness and fatigue.  2.  Initiating apnea therapy is recommended given the presence of symptoms and/or associated conditions. Recommend proceeding with one of the following:  a.  Auto-CPAP therapy with a pressure range of 5-20cm H2O.  b.  An oral appliance (OA) that can be obtained from certain dentists with expertise in sleep medicine.  These are primarily of use in non-obese patients with mild and moderate disease.  c.  An ENT consultation which may be useful to look for specific causes of obstruction and possible treatment options.  d.  If patient is intolerant to PAP therapy, consider referral to ENT for evaluation for hypoglossal nerve stimulator.  3.  Close follow-up is necessary to ensure success with CPAP or oral appliance therapy for maximum benefit.  4.  A follow-up oximetry study on CPAP is recommended to assess the adequacy of therapy and determine the need for supplemental oxygen or the potential need for Bi-level  therapy.  An arterial blood gas to determine the adequacy of baseline ventilation  and oxygenation should also be considered.  5.  Healthy sleep recommendations include:  adequate nightly sleep (normal 7-9 hrs/night), avoidance of caffeine after noon and alcohol near  bedtime, and maintaining a sleep environment that is cool, dark and quiet.  6.  Weight loss for overweight patients is recommended.  Even modest amounts of weight loss can significantly improve the severity of sleep apnea.  7.  Snoring recommendations include:  weight loss where appropriate, side sleeping, and avoidance of alcohol before bed.  8.  Operation of motor vehicle should be avoided when sleepy.    This study was personally reviewed and electronically signed by: Dr. Wilbert Bihari Accredited Board Certified in Sleep Medicine Date/Time: 12/31/2024 8:20PM "

## 2025-01-02 ENCOUNTER — Ambulatory Visit (INDEPENDENT_AMBULATORY_CARE_PROVIDER_SITE_OTHER): Payer: MEDICAID | Admitting: Internal Medicine

## 2025-01-02 ENCOUNTER — Encounter: Payer: Self-pay | Admitting: Internal Medicine

## 2025-01-02 VITALS — BP 160/90 | Ht 64.0 in | Wt 236.0 lb

## 2025-01-02 DIAGNOSIS — Z89512 Acquired absence of left leg below knee: Secondary | ICD-10-CM

## 2025-01-02 DIAGNOSIS — E1165 Type 2 diabetes mellitus with hyperglycemia: Secondary | ICD-10-CM

## 2025-01-02 DIAGNOSIS — N1831 Chronic kidney disease, stage 3a: Secondary | ICD-10-CM

## 2025-01-02 DIAGNOSIS — E1142 Type 2 diabetes mellitus with diabetic polyneuropathy: Secondary | ICD-10-CM

## 2025-01-02 DIAGNOSIS — E1122 Type 2 diabetes mellitus with diabetic chronic kidney disease: Secondary | ICD-10-CM | POA: Diagnosis not present

## 2025-01-02 DIAGNOSIS — Z794 Long term (current) use of insulin: Secondary | ICD-10-CM

## 2025-01-02 LAB — POCT GLYCOSYLATED HEMOGLOBIN (HGB A1C): Hemoglobin A1C: 9.7 % — AB (ref 4.0–5.6)

## 2025-01-02 MED ORDER — OMNIPOD 5 G7 PODS (GEN 5) MISC: 1.0000 | 3 refills | Status: AC

## 2025-01-02 MED ORDER — DEXCOM G7 SENSOR MISC: 1.0000 | 3 refills | Status: AC

## 2025-01-02 NOTE — Progress Notes (Signed)
 " Name: Sarah Long  MRN/ DOB: [id number], 11-22-1971   Age/ Sex: 54 y.o., female    PCP: Delbert Clam, MD   Reason for Endocrinology Evaluation: Type 2 Diabetes Mellitus     Date of Initial Endocrinology Visit: 11/02/2021    Long IDENTIFIER: Sarah Long is a 54 y.o. female with a past medical history of HTN , Bipolar d/o,and T2DM, has Hx of Pancreatitis , fabry's disease . The Long presented for initial endocrinology clinic visit on 11/02/2021 for consultative assistance with Sarah Long diabetes management.    HPI: Sarah Long was    Diagnosed with DM at age 45 stated as gestational diabetes by age 53 Sarah Long became diagnosed with T2 DM  Prior Medications tried/Intolerance: Intolerant to Metformin .  Hemoglobin A1c has ranged from 7.4% in 2022, peaking at 13.8% in 2014. Long has required hospitalization within the last 1 year from hyper or hypoglycemia: DKA 06/2021   On Sarah Long initial visit to our clinic Sarah Long had an A1c 7.4%, we continued MDI regimen and provided Sarah Long with correction scale    Sarah Long was trained on OmniPod 07/2023  We discontinued the OmniPod and started Sarah Long on basal/prandial insulin  in June, 2025 with an A1c of 10.0%  SUBJECTIVE:   During the last visit (10/02/2024): A1c 12.9%     Today (01/02/25): Sarah Long is here for a follow up on diabetes management. Sarah Long checks Sarah Long  blood sugars multiple times daily through CGM.  The Long has not had hypoglycemic episodes since the last clinic visit.  Long continues to follow-up with behavioral health for bipolar disorder and depression Sarah Long follows with cardiology for CHF, on Farxiga  Sarah Long is on Galafold for Fabry's disease through [facility] health  Continues with early morning nausea, no vomiting  No heartburn issues  Has constipation but no diarrhea   Sarah Long with type 2 diabetes is treated with Omnipod (insulin  pump). During the visit the pump basal and bolus doses were reviewed including carb/insulin  rations and  supplemental doses. The clinical list was updated. The glucose meter download was reviewed in detail to determine if the current pump settings are providing the best glycemic control without excessive hypoglycemia.   Pump and CGM download:          Pump   OmniPod Settings   Insulin  type   Humalog     Basal rate          0000 2.10u/h          I:C ratio          0000  1:1      Enter 14                        Sensitivity          0000  20         Goal          0000  110              Type & Model of Pump: OmniPod Insulin  Type: Currently using Humalog .      PUMP STATISTICS:               CONTINUOUS GLUCOSE MONITORING RECORD INTERPRETATION     Results statistics:   CGM use % of time 74.2  Average and SD 201/54  Time in range     47   %  % Time Above 180 38  % Time above 250 19  % Time Below  target 0      HOME DIABETES REGIMEN: Humalog       Statin: no ACE-I/ARB: no Prior Diabetic Education: yes      DIABETIC COMPLICATIONS: Microvascular complications:  Left BKA, Right  great toe amputation , neuropathy  Denies: CKD, retinopathy  Last eye exam: Completed 06/12/2024  Macrovascular complications:  PAD Denies: CAD, CVA   PAST HISTORY: Past Medical History:  Past Medical History:  Diagnosis Date   Abnormal Pap smear 1998   Abnormal vaginal bleeding 12/19/2011   Acute urinary retention 06/26/2021   Arthritis    Bacterial infection    Bipolar 1 disorder (HCC)    Blister of second toe of left foot 11/21/2016   Candida vaginitis 07/2007   Depression    recently added wellbutrin-has not taken yet for bipolar   Diabetes in pregnancy    Diabetes mellitus    nph 20U qam and qpm, regular with meals   Diabetic ketoacidosis without coma associated with type 2 diabetes mellitus (HCC)    Fibroid    Galactorrhea of right breast 2008   GERD (gastroesophageal reflux disease)    H/O amenorrhea 07/2007   H/O dizziness 10/14/2011   H/O  dysmenorrhea 2010   H/O menorrhagia 10/14/2011   H/O varicella    Headache(784.0)    Heavy vaginal bleeding due to contraceptive injection use 10/12/2011   Depo Provera    Herpes    Homelessness 11/19/2021   HSV-2 infection 01/03/2009   Hx: UTI (urinary tract infection) 2009   Hypertension    on aldomet   Hypoalbuminemia due to protein-calorie malnutrition 06/26/2021   Increased BMI 2010   Irregular uterine bleeding 04/04/2012   Sarah Long has mirena    Obesity 10/14/2011   Oligomenorrhea 07/2007   Osteomyelitis of great toe of right foot (HCC) 03/22/2022   Pelvic pain in female    Presence of permanent cardiac pacemaker    Preterm labor    PTSD (post-traumatic stress disorder)    Trichomonas    Yeast infection    Past Surgical History:  Past Surgical History:  Procedure Laterality Date   ABDOMINAL AORTOGRAM W/LOWER EXTREMITY N/A 06/17/2021   Procedure: ABDOMINAL AORTOGRAM W/LOWER EXTREMITY;  Surgeon: Darron Deatrice LABOR, MD;  Location: [facility] INVASIVE CV LAB;  Service: Cardiovascular;  Laterality: N/A;   AMPUTATION Left 06/27/2021   Procedure: AMPUTATION BELOW KNEE;  Surgeon: Harden Jerona GAILS, MD;  Location: [facility] OR;  Service: Orthopedics;  Laterality: Left;   AMPUTATION Right 09/04/2021   Procedure: RIGHT GREAT TOE AMPUTATION;  Surgeon: Harden Jerona GAILS, MD;  Location: [facility] OR;  Service: Orthopedics;  Laterality: Right;   CESAREAN SECTION  1991   LEFT HEART CATH AND CORONARY ANGIOGRAPHY N/A 02/10/2020   Procedure: LEFT HEART CATH AND CORONARY ANGIOGRAPHY;  Surgeon: Burnard Debby LABOR, MD;  Location: [facility] INVASIVE CV LAB;  Service: Cardiovascular;  Laterality: N/A;   PACEMAKER IMPLANT N/A 02/13/2020   Procedure: PACEMAKER IMPLANT;  Surgeon: Inocencio Soyla Lunger, MD;  Location: [facility] INVASIVE CV LAB;  Service: Cardiovascular;  Laterality: N/A;   TEMPORARY PACEMAKER N/A 02/10/2020   Procedure: TEMPORARY PACEMAKER;  Surgeon: Burnard Debby LABOR, MD;  Location[facility] INVASIVE CV LAB;  Service: Cardiovascular;  Laterality:  N/A;    Social History:  reports that Sarah Long quit smoking about 3 years ago. Sarah Long smoking use included cigarettes. Sarah Long started smoking about 23 years ago. Sarah Long has a 10 pack-year smoking history. Sarah Long has never used smokeless tobacco. Sarah Long reports that Sarah Long does not currently use alcohol. Sarah Long reports that  Sarah Long does not use drugs. Family History:  Family History  Problem Relation Age of Onset   Hypertension Mother    Diabetes Mother    Heart disease Mother    Hypertension Father    Diabetes Father    Heart disease Father    Depression Maternal Grandmother    Suicidality Maternal Grandmother    Stroke Maternal Grandfather    Schizophrenia Paternal Grandmother    Other Neg Hx    Breast cancer Neg Hx      HOME MEDICATIONS: Allergies as of 01/02/2025       Reactions   Lamictal  [lamotrigine ] Itching   Strawberry Extract Hives   Sulfa Antibiotics Hives, Itching        Medication List        Accurate as of January 02, 2025 11:06 AM. If you have any questions, ask your nurse or doctor.          ARIPiprazole  15 MG tablet Commonly known as: ABILIFY  Take 1 tablet (15 mg total) by mouth daily.   atorvastatin  40 MG tablet Commonly known as: LIPITOR  Take 1 tablet (40 mg total) by mouth daily.   benzonatate  100 MG capsule Commonly known as: TESSALON  Take 1 capsule (100 mg total) by mouth every 8 (eight) hours.   carvedilol  12.5 MG tablet Commonly known as: COREG  TAKE 1 TABLET BY MOUTH 2 (TWO) TIMES DAILY WITH A MEAL (AM+PM)   dapagliflozin  propanediol 10 MG Tabs tablet Commonly known as: Farxiga  Take 1 tablet (10 mg total) by mouth daily before breakfast.   Dexcom G7 Sensor Misc 1 Device by Other route every 14 (fourteen) days. Every 10 days What changed: when to take Sarah Changed by: Donell Butts, MD   doxycycline  100 MG capsule Commonly known as: VIBRAMYCIN  Take 1 capsule (100 mg total) by mouth 2 (two) times daily.   Easy Comfort Pen Needles 31G X 5 MM  Misc Generic drug: Insulin  Pen Needle USE AS DIRECTED IN THE MORNING, AT NOON, IN THE EVENING, AND AT BEDTIME.   fluconazole  150 MG tablet Commonly known as: DIFLUCAN  Take 1 tablet (150 mg total) by mouth daily.   furosemide  20 MG tablet Commonly known as: LASIX  Take 80 mg by mouth daily.   Galafold 123 MG Caps Generic drug: migALAstat HCl Take by mouth.   Guaifenesin  1200 MG Tb12 Take 1 tablet (1,200 mg total) by mouth 2 (two) times daily at 10 AM and 5 PM.   hydrALAZINE  50 MG tablet Commonly known as: APRESOLINE  TAKE ONE TABLET BY MOUTH THREE TIMES A DAY. (AM+NOON+PM)   hydrOXYzine  25 MG tablet Commonly known as: ATARAX  Take 25 mg by mouth daily as needed.   insulin  lispro 100 UNIT/ML KwikPen Commonly known as: HUMALOG  Inject 14 Units into the skin 3 (three) times daily. Before meals daily   isosorbide  dinitrate 20 MG tablet Commonly known as: ISORDIL  TAKE 1 TABLET (20 MG TOTAL) BY MOUTH 2 (TWO) TIMES DAILY (AM+PM)   Lantus  SoloStar 100 UNIT/ML Solostar Pen Generic drug: insulin  glargine Inject 40 Units into the skin daily.   Misc. Devices Misc Scale.  Diagnosis-CHF   olopatadine  0.1 % ophthalmic solution Commonly known as: PATANOL Place 1 drop into both eyes 2 (two) times daily.   Omnipod 5 DexG7G6 Pods Gen 5 Misc 1 Device by Other route every other day. Change pod every other day   spironolactone  25 MG tablet Commonly known as: ALDACTONE  Take 0.5 tablets (12.5 mg total) by mouth daily.   Tegaderm Ag Mesh 4X5  Pads Apply 1 Device topically daily in the afternoon.   terconazole  0.8 % vaginal cream Commonly known as: TERAZOL 3  Place 1 applicator vaginally at bedtime.   trimethoprim -polymyxin b  ophthalmic solution Commonly known as: Polytrim  Place 1 drop into both eyes every 6 (six) hours. While awake.   valsartan  320 MG tablet Commonly known as: DIOVAN  TAKE 1 TABLET (320 MG TOTAL) BY MOUTH DAILY (AM)   varenicline  1 MG tablet Commonly known as:  Chantix  Continuing Month Pak Take 1 tablet (1 mg total) by mouth 2 (two) times daily.   Ventolin  HFA 108 (90 Base) MCG/ACT inhaler Generic drug: albuterol  INHALE 2 PUFFS INTO THE LUNGS EVERY 6 (SIX) HOURS AS NEEDED FOR WHEEZING OR SHORTNESS OF BREATH.         ALLERGIES: Allergies  Allergen Reactions   Lamictal  [Lamotrigine ] Itching   Strawberry Extract Hives   Sulfa Antibiotics Hives and Itching     REVIEW OF SYSTEMS: A comprehensive ROS was conducted with the Long and is negative except as per HPI     OBJECTIVE:   VITAL SIGNS: BP (!) 160/90   Ht 5' 4 (1.626 m)   Wt 236 lb (107 kg)   BMI 40.51 kg/m    PHYSICAL EXAM:  General: Sarah Long appears well and is in NAD  Lungs: Clear with good BS bilat   Heart: RRR   Extremities: Left BKA   Neuro: MS is good with appropriate affect, Sarah Long is alert and Ox3   DM Foot Exam 10/02/2024  Left below-knee amputation  Right great toe amputation  the pedal pulses 1+ on right  The sensation is intact on the right   DATA REVIEWED:  Lab Results  Component Value Date   HGBA1C 9.7 (A) 01/02/2025   HGBA1C 12.9 (A) 10/02/2024   HGBA1C 10.0 (A) 05/23/2024    Latest Reference Range & Units 12/02/24 07:07  Sodium 135 - 145 mmol/L 132 (L)  Potassium 3.5 - 5.1 mmol/L 4.8  Chloride 98 - 111 mmol/L 103  CO2 22 - 32 mmol/L 19 (L)  Glucose 70 - 99 mg/dL 735 (H)  BUN 6 - 20 mg/dL 19  Creatinine 9.55 - 8.99 mg/dL 8.51 (H)  Calcium  8.9 - 10.3 mg/dL 8.3 (L)  Anion gap 5 - 15  10    ASSESSMENT / PLAN / RECOMMENDATIONS:   1) Type 2 Diabetes Mellitus, Poorly  controlled, With neuropathic complications,CKD III,  S/P Left BKA and Right great toe amputation  - Most recent A1c of 9.7 %. Goal A1c < 7.0 %.      -A1c has trended down but continues to be above goal -Sarah Long continues with intermittent use of the OmniPod to take break, I did advise the Long if Sarah Long would like to take a break from the OmniPod Sarah Long should change insertion locations  rather than discontinuing the pump altogether, for example if Sarah Long has been using Sarah Long abdomen, Sarah Long may use Sarah Long arms during the break  -A new prescription for Dexcom has been sent  -I have again encouraged Sarah Long to continue to enter carbohydrates with each meal, as Sarah Long has not been consistent with Sarah - Sarah Long has a hx of Pancreatitis , not a candidate for DPP 4 inhibitors and GLP-1 agonist - Has hx of DKA , Sarah Long is on SGLT2 inhibitors through cardiology  - Unable to make changes to Sarah Long pump, as Sarah Long PDM battery failed, we attempted to charge it in the office, but there was no indication on the status, Long advised to  charge Sarah Long PDM every night especially when Sarah Long has an endocrinology appointment for the next day    MEDICATIONS: - Humalog       Pump   OmniPod Settings   Insulin  type   Humalog     Basal rate          0000 2.10u/h          I:C ratio          0000  1:1      Enter 14                        Sensitivity          0000  20         Goal          0000  110         EDUCATION / INSTRUCTIONS: BG monitoring instructions: Long is instructed to check Sarah Long blood sugars 3 times a day, before meals . Call [facility] if: BG persistently < 70  I reviewed the Rule of 15 for the treatment of hypoglycemia in detail with the Long. Literature supplied.   2) Diabetic complications:  Eye: Does not have known diabetic retinopathy.  Neuro/ Feet: Does  have known diabetic peripheral neuropathy. Renal: Long does not have known baseline CKD. Sarah Long is not on an ACEI/ARB at present.   Follow-up in 3 months    In addition to time spent performing glucose monitor, I spent 25 minutes preparing to see the Long by review of recent labs, imaging and procedures, obtaining and reviewing separately obtained history, communicating with the Long, ordering medications, tests or procedures, and documenting clinical information in the EHR including the differential Dx,  treatment, and any further evaluation and other management    Signed electronically by: Stefano Redgie Butts, MD  [facility] Endocr[facility]ology  [facility] [address] [place] [place], [place] [address] Phone: [phone] FAX: [phone]   CC: Delbert Clam, MD [address] [place] [address] [place] [address] Phone: [phone]  Fax: [phone]    Return to Endocrinology clinic as below: Future Appointments  Date Time Provider Department Center  01/23/2025 10:30 AM Steffens, Rosaline SQUIBB, RN [facility] None    "

## 2025-01-02 NOTE — Patient Instructions (Signed)

## 2025-01-04 ENCOUNTER — Telehealth: Payer: Self-pay | Admitting: *Deleted

## 2025-01-04 NOTE — Telephone Encounter (Signed)
 The patient has been notified of the result via her mychart and asked to call our office to get scheduled.

## 2025-01-04 NOTE — Telephone Encounter (Signed)
-----   Message from Wilbert Bihari, MD sent at 12/31/2024  8:21 PM EST ----- Patient has very mild OSA - set up OV to discuss treatment options.

## 2025-01-09 ENCOUNTER — Encounter: Payer: Self-pay | Admitting: Family Medicine

## 2025-01-09 ENCOUNTER — Ambulatory Visit: Payer: Self-pay | Admitting: Family Medicine

## 2025-01-09 VITALS — BP 145/83 | HR 97 | Temp 98.0°F | Ht 64.0 in | Wt 240.6 lb

## 2025-01-09 DIAGNOSIS — Z01818 Encounter for other preprocedural examination: Secondary | ICD-10-CM

## 2025-01-09 DIAGNOSIS — E1122 Type 2 diabetes mellitus with diabetic chronic kidney disease: Secondary | ICD-10-CM

## 2025-01-09 DIAGNOSIS — M533 Sacrococcygeal disorders, not elsewhere classified: Secondary | ICD-10-CM | POA: Diagnosis not present

## 2025-01-09 DIAGNOSIS — Z794 Long term (current) use of insulin: Secondary | ICD-10-CM

## 2025-01-09 DIAGNOSIS — Z23 Encounter for immunization: Secondary | ICD-10-CM | POA: Diagnosis not present

## 2025-01-09 DIAGNOSIS — I129 Hypertensive chronic kidney disease with stage 1 through stage 4 chronic kidney disease, or unspecified chronic kidney disease: Secondary | ICD-10-CM | POA: Diagnosis not present

## 2025-01-09 DIAGNOSIS — E1142 Type 2 diabetes mellitus with diabetic polyneuropathy: Secondary | ICD-10-CM | POA: Diagnosis not present

## 2025-01-09 MED ORDER — METHOCARBAMOL 750 MG PO TABS: 750.0000 mg | ORAL_TABLET | Freq: Three times a day (TID) | ORAL | 1 refills | Status: AC | PRN

## 2025-01-09 NOTE — Patient Instructions (Signed)
 VISIT SUMMARY:  During your visit, we discussed your dental clearance, tailbone pain, and reviewed your ongoing health conditions including diabetes, hypertension, heart block, and bipolar disorder.  YOUR PLAN:  -COCCYX INJURY WITH PAIN: You have a tailbone injury from a recent fall, causing severe pain especially when sitting or standing. We will manage this conservatively due to your heart condition. You are prescribed methocarbamol  for muscle relaxation, recommended to use Tylenol  Extra Strength for pain, and suggested Voltaren  gel for topical relief.  -TYPE 2 DIABETES MELLITUS WITH DIABETIC PERIPHERAL NEUROPATHY: You have type 2 diabetes with nerve damage in your extremities. Your diabetes management with the Omnipod insulin  pump is ongoing and was recently reviewed by your endocrinologist.  -HYPERTENSION ASSOCIATED WITH CHRONIC KIDNEY DISEASE DUE TO TYPE 2 DIABETES MELLITUS: You have high blood pressure related to chronic kidney disease caused by diabetes. Your blood pressure management will continue with your current medications.  -COMPLETE HEART BLOCK WITH PACEMAKER: You have a complete heart block and a pacemaker to manage your heart rhythm. It is important to take your carvedilol  as prescribed to control your heart rate and maintain regular follow-ups with your cardiologist.  -BIPOLAR DISORDER: You have bipolar disorder, which is being managed with aripiprazole .  -GENERAL HEALTH MAINTENANCE: We discussed the importance of getting a flu vaccination due to your high-risk status. It is recommended that you receive the flu vaccine.  INSTRUCTIONS:  Please ensure you take your carvedilol  as prescribed and maintain regular follow-ups with your cardiologist. Continue with your current diabetes and blood pressure management plans. Use the prescribed medications and recommended treatments for your tailbone pain. Also, get your flu vaccination as discussed.

## 2025-01-09 NOTE — Progress Notes (Signed)
 "  Subjective:  Patient ID: Sarah Long, female    DOB: 1971-08-10  Age: 54 y.o. MRN: [id number]  CC: dental clearance     Discussed the use of AI scribe software for clinical note transcription with the patient, who gave verbal consent to proceed.  History of Present Illness Sarah Long is a 54 year old female with a who presents for dental clearanc medical history significant for  type two diabetes mellitus, hypertension, left below knee amputation due to chronic toe wounds with osteomyelitis, complete heart block with pacemaker placement, CHF stage three chronic kidney disease, hyperlipidemia, bipolar disorder and Fabry's disease  and tailbone pain.  She seeks dental clearance for multiple teeth filling. She previously had a similar dental procedure without complications, and the current procedure is awaiting clearance.  She has a pacemaker for heart block. Current medications are Farxiga , Abilify , atorvastatin , carvedilol , doxycycline , furosemide , hydralazine , Lantus , spironolactone , and valsartan . She did not take carvedilol  today due to her busy schedule her blood pressure is elevated.  She fell last Saturday and since then has had severe tailbone pain, described as my butt is on fire. Pain worsens with sitting and standing. She uses Tylenol , ibuprofen , and a heating pad with partial relief. Pain limits activity and she is unable to dance.  She smokes cigarettes off and on and denies cocaine or marijuana use. She works long hours while caring for her children, which makes it hard for her to manage medications and appointments. She follows with endocrinology and recently started an Omnipod for diabetes management.    Past Medical History:  Diagnosis Date   Abnormal Pap smear 1998   Abnormal vaginal bleeding 12/19/2011   Acute urinary retention 06/26/2021   Arthritis    Bacterial infection    Bipolar 1 disorder (HCC)    Blister of second toe of left foot 11/21/2016   Candida  vaginitis 07/2007   Depression    recently added wellbutrin-has not taken yet for bipolar   Diabetes in pregnancy    Diabetes mellitus    nph 20U qam and qpm, regular with meals   Diabetic ketoacidosis without coma associated with type 2 diabetes mellitus (HCC)    Fibroid    Galactorrhea of right breast 2008   GERD (gastroesophageal reflux disease)    H/O amenorrhea 07/2007   H/O dizziness 10/14/2011   H/O dysmenorrhea 2010   H/O menorrhagia 10/14/2011   H/O varicella    Headache(784.0)    Heavy vaginal bleeding due to contraceptive injection use 10/12/2011   Depo Provera    Herpes    Homelessness 11/19/2021   HSV-2 infection 01/03/2009   Hx: UTI (urinary tract infection) 2009   Hypertension    on aldomet   Hypoalbuminemia due to protein-calorie malnutrition 06/26/2021   Increased BMI 2010   Irregular uterine bleeding 04/04/2012   Pt has mirena    Obesity 10/14/2011   Oligomenorrhea 07/2007   Osteomyelitis of great toe of right foot (HCC) 03/22/2022   Pelvic pain in female    Presence of permanent cardiac pacemaker    Preterm labor    PTSD (post-traumatic stress disorder)    Trichomonas    Yeast infection     Past Surgical History:  Procedure Laterality Date   ABDOMINAL AORTOGRAM W/LOWER EXTREMITY N/A 06/17/2021   Procedure: ABDOMINAL AORTOGRAM W/LOWER EXTREMITY;  Surgeon: Darron Deatrice LABOR, MD;  Location: [facility];  Service: Cardiovascular;  Laterality: N/A;   AMPUTATION Left 06/27/2021   Procedure: AMPUTATION BELOW KNEE;  Surgeon: Harden Jerona GAILS, MD;  Location: [facility] OR;  Service: Orthopedics;  Laterality: Left;   AMPUTATION Right 09/04/2021   Procedure: RIGHT GREAT TOE AMPUTATION;  Surgeon: Harden Jerona GAILS, MD;  Location: [facility] OR;  Service: Orthopedics;  Laterality: Right;   CESAREAN SECTION  1991   LEFT HEART CATH AND CORONARY ANGIOGRAPHY N/A 02/10/2020   Procedure: LEFT HEART CATH AND CORONARY ANGIOGRAPHY;  Surgeon: Burnard Debby LABOR, MD;  Location: [facility];  Service: Cardiovascular;  Laterality: N/A;   PACEMAKER IMPLANT N/A 02/13/2020   Procedure: PACEMAKER IMPLANT;  Surgeon: Inocencio Soyla Lunger, MD;  Location: [facility];  Service: Cardiovascular;  Laterality: N/A;   TEMPORARY PACEMAKER N/A 02/10/2020   Procedure: TEMPORARY PACEMAKER;  Surgeon: Burnard Debby LABOR, MD;  Location: [facility] INV[facility] CV LAB;  Service: Cardiovascular;  Laterality: N/A;    Family History  Problem Relation Age of Onset   Hypertension Mother    Diabetes Mother    Heart disease Mother    Hypertension Father    Diabetes Father    Heart disease Father    Depression Maternal Grandmother    Suicidality Maternal Grandmother    Stroke Maternal Grandfather    Schizophrenia Paternal Grandmother    Other Neg Hx    Breast cancer Neg Hx     Social History   Socioeconomic History   Marital status: Significant Other    Spouse name: Not on file   Number of children: Not on file   Years of education: Not on file   Highest education level: Some college, no degree  Occupational History   Not on file  Tobacco Use   Smoking status: Former    Current packs/day: 0.00    Average packs/day: 0.5 packs/day for 20.0 years (10.0 ttl pk-yrs)    Types: Cigarettes    Start date: 07/01/2001    Quit date: 07/01/2021    Years since quitting: 3.5   Smokeless tobacco: Never   Tobacco comments:    1. Centrix to stop smoking   Vaping Use   Vaping status: Never Used  Substance and Sexual Activity   Alcohol use: Not Currently    Comment: denies recently; history of excessive use   Drug use: No   Sexual activity: Yes    Partners: Female, Female    Birth control/protection: None  Other Topics Concern   Not on file  Social History Narrative   Not on file   Social Drivers of Health   Tobacco Use: Medium Risk (01/09/2025)   Patient History    Smoking Tobacco Use: Former    Smokeless Tobacco Use: Never    Passive Exposure: Not on file  Financial Resource Strain: High Risk  (12/22/2023)   Overall Financial Resource Strain (CARDIA)    Difficulty of Paying Living Expenses: Hard  Food Insecurity: Food Insecurity Present (12/26/2024)   Epic    Worried About Programme Researcher, Broadcasting/film/video in the Last Year: Sometimes true    Ran Out of Food in the Last Year: Sometimes true  Transportation Needs: No Transportation Needs (12/26/2024)   Epic    Lack of Transportation (Medical): No    Lack of Transportation (Non-Medical): No  Physical Activity: Insufficiently Active (12/22/2023)   Exercise Vital Sign    Days of Exercise per Week: 3 days    Minutes of Exercise per Session: 30 min  Stress: Stress Concern Present (12/29/2023)   [employer] of Occupational Health - Occupational Stress Questionnaire    Feeling of  Stress : To some extent  Social Connections: Moderately Integrated (12/22/2023)   Social Connection and Isolation Panel    Frequency of Communication with Friends and Family: More than three times a week    Frequency of Social Gatherings with Friends and Family: More than three times a week    Attends Religious Services: More than 4 times per year    Active Member of Clubs or Organizations: Yes    Attends Banker Meetings: More than 4 times per year    Marital Status: Divorced  Depression (PHQ2-9): Low Risk (12/26/2024)   Depression (PHQ2-9)    PHQ-2 Score: 1  Alcohol Screen: Low Risk (12/18/2023)   Alcohol Screen    Last Alcohol Screening Score (AUDIT): 0  Housing: Low Risk (12/26/2024)   Epic    Unable to Pay for Housing in the Last Year: No    Number of Times Moved in the Last Year: 0    Homeless in the Last Year: No  Utilities: Not At Risk (12/26/2024)   Epic    Threatened with loss of utilities: No  Health Literacy: Adequate Health Literacy (12/22/2023)   B1300 Health Literacy    Frequency of need for help with medical instructions: Never    Allergies[1]  Outpatient Medications Prior to Visit  Medication Sig Dispense Refill   ARIPiprazole   (ABILIFY ) 15 MG tablet Take 1 tablet (15 mg total) by mouth daily. 30 tablet 2   atorvastatin  (LIPITOR ) 40 MG tablet Take 1 tablet (40 mg total) by mouth daily. 90 tablet 3   carvedilol  (COREG ) 12.5 MG tablet TAKE 1 TABLET BY MOUTH 2 (TWO) TIMES DAILY WITH A MEAL (AM+PM) 120 tablet 1   dapagliflozin  propanediol (FARXIGA ) 10 MG TABS tablet Take 1 tablet (10 mg total) by mouth daily before breakfast. 30 tablet 11   doxycycline  (VIBRAMYCIN ) 100 MG capsule Take 1 capsule (100 mg total) by mouth 2 (two) times daily. 20 capsule 0   EASY COMFORT PEN NEEDLES 31G X 5 MM MISC USE AS DIRECTED IN THE MORNING, AT NOON, IN THE EVENING, AND AT BEDTIME. 100 each 3   furosemide  (LASIX ) 20 MG tablet Take 80 mg by mouth daily.     GALAFOLD 123 MG CAPS Take by mouth.     hydrALAZINE  (APRESOLINE ) 50 MG tablet TAKE ONE TABLET BY MOUTH THREE TIMES A DAY. (AM+NOON+PM) 270 tablet 0   hydrOXYzine  (ATARAX ) 25 MG tablet Take 25 mg by mouth daily as needed.     insulin  glargine (LANTUS  SOLOSTAR) 100 UNIT/ML Solostar Pen Inject 40 Units into the skin daily. 45 mL 3   insulin  lispro (HUMALOG ) 100 UNIT/ML KwikPen Inject 14 Units into the skin 3 (three) times daily. Before meals daily 45 mL 4   isosorbide  dinitrate (ISORDIL ) 20 MG tablet TAKE 1 TABLET (20 MG TOTAL) BY MOUTH 2 (TWO) TIMES DAILY (AM+PM) 180 tablet 0   Misc. Devices MISC Scale.  Diagnosis-CHF 1 each 0   Silver (TEGADERM AG MESH) 4X5 PADS Apply 1 Device topically daily in the afternoon. 50 each 3   spironolactone  (ALDACTONE ) 25 MG tablet Take 0.5 tablets (12.5 mg total) by mouth daily. 45 tablet 3   valsartan  (DIOVAN ) 320 MG tablet TAKE 1 TABLET (320 MG TOTAL) BY MOUTH DAILY (AM) 90 tablet 0   varenicline  (CHANTIX  CONTINUING MONTH PAK) 1 MG tablet Take 1 tablet (1 mg total) by mouth 2 (two) times daily. 60 tablet 2   VENTOLIN  HFA 108 (90 Base) MCG/ACT inhaler INHALE 2 PUFFS INTO  THE LUNGS EVERY 6 (SIX) HOURS AS NEEDED FOR WHEEZING OR SHORTNESS OF BREATH. 18 g 2    benzonatate  (TESSALON ) 100 MG capsule Take 1 capsule (100 mg total) by mouth every 8 (eight) hours. (Patient not taking: Reported on 01/09/2025) 21 capsule 0   Continuous Glucose Sensor (DEXCOM G7 SENSOR) MISC 1 Device by Other route every 14 (fourteen) days. Every 10 days 6 each 3   fluconazole  (DIFLUCAN ) 150 MG tablet Take 1 tablet (150 mg total) by mouth daily. (Patient not taking: Reported on 01/09/2025) 1 tablet 0   Guaifenesin  1200 MG TB12 Take 1 tablet (1,200 mg total) by mouth 2 (two) times daily at 10 AM and 5 PM. (Patient not taking: Reported on 01/09/2025) 14 tablet 0   Insulin  Disposable Pump (OMNIPOD 5 G7 PODS, GEN 5,) MISC 1 Device by Does not apply route every other day. 45 each 3   olopatadine  (PATANOL) 0.1 % ophthalmic solution Place 1 drop into both eyes 2 (two) times daily. (Patient not taking: Reported on 01/09/2025) 5 mL 0   terconazole  (TERAZOL 3 ) 0.8 % vaginal cream Place 1 applicator vaginally at bedtime. (Patient not taking: Reported on 01/09/2025) 20 g 0   trimethoprim -polymyxin b  (POLYTRIM ) ophthalmic solution Place 1 drop into both eyes every 6 (six) hours. While awake. (Patient not taking: Reported on 01/09/2025) 10 mL 0   No facility-administered medications prior to visit.     ROS Review of Systems  Constitutional:  Negative for activity change and appetite change.  HENT:  Negative for sinus pressure and sore throat.   Respiratory:  Negative for chest tightness, shortness of breath and wheezing.   Cardiovascular:  Negative for chest pain and palpitations.  Gastrointestinal:  Negative for abdominal distention, abdominal pain and constipation.  Genitourinary: Negative.   Musculoskeletal: Negative.   Psychiatric/Behavioral:  Negative for behavioral problems and dysphoric mood.     Objective:  BP (!) 145/83   Pulse 97   Temp 98 F (36.7 C) (Oral)   Ht 5' 4 (1.626 m)   Wt 240 lb 9.6 oz (109.1 kg)   SpO2 100%   BMI 41.30 kg/m      01/09/2025    3:09 PM 01/09/2025     2:28 PM 01/02/2025   11:00 AM  BP/Weight  Systolic BP 145 146 160  Diastolic BP 83 84 90  Wt. (Lbs)  240.6   BMI  41.3 kg/m2       Physical Exam Constitutional:      Appearance: She is well-developed.  Cardiovascular:     Rate and Rhythm: Normal rate.     Heart sounds: Normal heart sounds. No murmur heard. Pulmonary:     Effort: Pulmonary effort is normal.     Breath sounds: Normal breath sounds. No wheezing or rales.  Chest:     Chest wall: No tenderness.  Abdominal:     General: Bowel sounds are normal. There is no distension.     Palpations: Abdomen is soft. There is no mass.     Tenderness: There is no abdominal tenderness.  Musculoskeletal:     Right lower leg: No edema.     Comments: Left BKA  Neurological:     Mental Status: She is alert and oriented to person, place, and time.  Psychiatric:        Mood and Affect: Mood normal.        Latest Ref Rng & Units 12/02/2024    7:07 AM 08/29/2024    9:16 AM 08/24/2024  10:10 AM  CMP  Glucose 70 - 99 mg/dL 735   414   BUN 6 - 20 mg/dL 19   23   Creatinine 9.55 - 1.00 mg/dL 8.51   7.88   Sodium 864 - 145 mmol/L 132   129   Potassium 3.5 - 5.1 mmol/L 4.8   4.7   Chloride 98 - 111 mmol/L 103   100   CO2 22 - 32 mmol/L 19   17   Calcium  8.9 - 10.3 mg/dL 8.3   8.7   Total Protein 6.5 - 8.1 g/dL  6.3    Total Bilirubin 0.0 - 1.2 mg/dL  0.5    Alkaline Phos 38 - 126 U/L  80    AST 15 - 41 U/L  23    ALT 0 - 44 U/L  17      Lipid Panel     Component Value Date/Time   CHOL 188 08/29/2024 0916   CHOL 154 12/22/2023 1129   TRIG 240 (H) 08/29/2024 0916   HDL 49 08/29/2024 0916   HDL 45 12/22/2023 1129   CHOLHDL 3.8 08/29/2024 0916   VLDL 48 (H) 08/29/2024 0916   LDLCALC 91 08/29/2024 0916   LDLCALC 82 12/22/2023 1129    CBC    Component Value Date/Time   WBC 7.5 12/02/2024 0707   RBC 3.52 (L) 12/02/2024 0707   HGB 9.4 (L) 12/02/2024 0707   HGB 14.0 02/21/2024 1433   HCT 30.7 (L) 12/02/2024 0707   HCT  45.4 02/21/2024 1433   PLT 247 12/02/2024 0707   PLT 242 02/21/2024 1433   MCV 87.2 12/02/2024 0707   MCV 84 02/21/2024 1433   MCH 26.7 12/02/2024 0707   MCHC 30.6 12/02/2024 0707   RDW 15.6 (H) 12/02/2024 0707   RDW 17.0 (H) 02/21/2024 1433   LYMPHSABS 1.5 12/02/2024 0707   LYMPHSABS 2.2 02/21/2024 1433   MONOABS 0.5 12/02/2024 0707   EOSABS 0.3 12/02/2024 0707   EOSABS 0.3 02/21/2024 1433   BASOSABS 0.0 12/02/2024 0707   BASOSABS 0.0 02/21/2024 1433    Lab Results  Component Value Date   HGBA1C 9.7 (A) 01/02/2025       Assessment & Plan Traumatic coccydynia Coccyx pain post-fall, worsened by sitting and standing. Conservative management due to cardiac condition limiting NSAID use. - Prescribed methocarbamol  as needed for muscle relaxation. - Recommended Tylenol  Extra Strength for pain management. - Suggested Voltaren  gel for topical pain relief.  Type 2 diabetes mellitus with diabetic peripheral neuropathy Uncontrolled with A1c of 9.7 from 12/2024, goal is less than 7.0 Diabetes management ongoing with Omnipod insulin  pump. Recent endocrinologist follow-up confirmed current plan.  Hypertension associated with chronic kidney disease due to type 2 diabetes mellitus Blood pressure management ongoing with multiple antihypertensives. Current reading 146/84 mmHg. -Blood pressure repeated still elevated - Elevation secondary to not taking antihypertensive today - Continue current antihypertensive regimen and medication adherence emphasized -Counseled on blood pressure goal of less than 130/80, low-sodium, DASH diet, medication compliance, 150 minutes of moderate intensity exercise per week. Discussed medication compliance, adverse effects.   Preoperative clearance Completed dental form for procedure and advised to avoid NSAIDs due to CHF.  Hypertension associated with CKD secondary to type 2 diabetes mellitus Uncontrolled blood pressure Non adherence to carvedilol  noted,  important for heart rate control. - Ensure adherence to carvedilol  regimen. - Maintain regular follow-up with cardiology.    General Health Maintenance Discussed flu vaccination due to high-risk status. Flu shot administered- Recommended  flu vaccination.       Meds ordered this encounter  Medications   methocarbamol  (ROBAXIN ) 750 MG tablet    Sig: Take 1 tablet (750 mg total) by mouth every 8 (eight) hours as needed for muscle spasms.    Dispense:  90 tablet    Refill:  1    Follow-up: Return in about 1 month (around 02/06/2025) for Chronic medical conditions.       Corrina Sabin, MD, FAAFP. [facility] [place], [place] [phone]   01/09/2025, 6:17 PM    [1]  Allergies Allergen Reactions   Lamictal  [Lamotrigine ] Itching   Strawberry Extract Hives   Sulfa Antibiotics Hives and Itching   "

## 2025-01-23 ENCOUNTER — Telehealth: Payer: MEDICAID

## 2025-03-12 ENCOUNTER — Ambulatory Visit (HOSPITAL_COMMUNITY): Payer: MEDICAID | Admitting: Cardiology

## 2025-04-10 ENCOUNTER — Ambulatory Visit: Payer: MEDICAID | Admitting: Internal Medicine
